# Patient Record
Sex: Female | Born: 1945
Health system: Southern US, Community
[De-identification: ages and names within clinical notes are randomized; demographics above are authoritative.]

## PROBLEM LIST (undated history)

## (undated) DIAGNOSIS — E785 Hyperlipidemia, unspecified: Secondary | ICD-10-CM

## (undated) DIAGNOSIS — F32A Depression, unspecified: Secondary | ICD-10-CM

## (undated) DIAGNOSIS — J45909 Unspecified asthma, uncomplicated: Secondary | ICD-10-CM

## (undated) DIAGNOSIS — F329 Major depressive disorder, single episode, unspecified: Secondary | ICD-10-CM

## (undated) DIAGNOSIS — R51 Headache: Secondary | ICD-10-CM

## (undated) DIAGNOSIS — I639 Cerebral infarction, unspecified: Secondary | ICD-10-CM

## (undated) DIAGNOSIS — R519 Headache, unspecified: Secondary | ICD-10-CM

## (undated) DIAGNOSIS — F419 Anxiety disorder, unspecified: Secondary | ICD-10-CM

## (undated) DIAGNOSIS — I10 Essential (primary) hypertension: Secondary | ICD-10-CM

## (undated) DIAGNOSIS — J449 Chronic obstructive pulmonary disease, unspecified: Secondary | ICD-10-CM

## (undated) DIAGNOSIS — M797 Fibromyalgia: Secondary | ICD-10-CM

## (undated) DIAGNOSIS — G894 Chronic pain syndrome: Secondary | ICD-10-CM

## (undated) DIAGNOSIS — E559 Vitamin D deficiency, unspecified: Secondary | ICD-10-CM

## (undated) DIAGNOSIS — T7840XA Allergy, unspecified, initial encounter: Secondary | ICD-10-CM

## (undated) DIAGNOSIS — K589 Irritable bowel syndrome without diarrhea: Secondary | ICD-10-CM

## (undated) HISTORY — DX: Fibromyalgia: M79.7

## (undated) HISTORY — PX: CHOLECYSTECTOMY: SHX55

## (undated) HISTORY — PX: SINUSOTOMY: SHX291

## (undated) HISTORY — DX: Major depressive disorder, single episode, unspecified: F32.9

## (undated) HISTORY — DX: Essential (primary) hypertension: I10

## (undated) HISTORY — PX: CERVICAL DISCECTOMY: SHX98

## (undated) HISTORY — DX: Allergy, unspecified, initial encounter: T78.40XA

## (undated) HISTORY — PX: ABDOMINAL HYSTERECTOMY: SHX81

## (undated) HISTORY — DX: Headache, unspecified: R51.9

## (undated) HISTORY — DX: Chronic obstructive pulmonary disease, unspecified: J44.9

## (undated) HISTORY — DX: Headache: R51

## (undated) HISTORY — DX: Anxiety disorder, unspecified: F41.9

## (undated) HISTORY — DX: Cerebral infarction, unspecified: I63.9

## (undated) HISTORY — PX: APPENDECTOMY: SHX54

## (undated) HISTORY — DX: Irritable bowel syndrome, unspecified: K58.9

## (undated) HISTORY — DX: Hyperlipidemia, unspecified: E78.5

## (undated) HISTORY — DX: Vitamin D deficiency, unspecified: E55.9

## (undated) HISTORY — DX: Depression, unspecified: F32.A

---

## 2001-07-16 ENCOUNTER — Ambulatory Visit (HOSPITAL_COMMUNITY): Admission: RE | Admit: 2001-07-16 | Discharge: 2001-07-16 | Payer: Self-pay | Admitting: Neurosurgery

## 2001-07-16 ENCOUNTER — Encounter: Payer: Self-pay | Admitting: Neurosurgery

## 2002-09-19 ENCOUNTER — Encounter: Payer: Self-pay | Admitting: Neurosurgery

## 2002-09-19 ENCOUNTER — Ambulatory Visit (HOSPITAL_COMMUNITY): Admission: RE | Admit: 2002-09-19 | Discharge: 2002-09-19 | Payer: Self-pay | Admitting: Neurosurgery

## 2002-12-28 ENCOUNTER — Inpatient Hospital Stay (HOSPITAL_COMMUNITY): Admission: RE | Admit: 2002-12-28 | Discharge: 2002-12-29 | Payer: Self-pay | Admitting: Neurosurgery

## 2003-06-20 ENCOUNTER — Other Ambulatory Visit: Payer: Self-pay

## 2003-10-31 ENCOUNTER — Ambulatory Visit: Payer: Self-pay | Admitting: Pain Medicine

## 2003-12-08 ENCOUNTER — Ambulatory Visit: Payer: Self-pay | Admitting: Pain Medicine

## 2003-12-14 ENCOUNTER — Ambulatory Visit: Payer: Self-pay | Admitting: Pain Medicine

## 2004-01-05 ENCOUNTER — Ambulatory Visit: Payer: Self-pay | Admitting: Pain Medicine

## 2004-02-06 ENCOUNTER — Ambulatory Visit: Payer: Self-pay | Admitting: Pain Medicine

## 2004-03-13 ENCOUNTER — Ambulatory Visit: Payer: Self-pay | Admitting: Pain Medicine

## 2004-03-19 ENCOUNTER — Ambulatory Visit: Payer: Self-pay | Admitting: Pain Medicine

## 2004-03-26 ENCOUNTER — Ambulatory Visit: Payer: Self-pay | Admitting: Family Medicine

## 2004-04-03 ENCOUNTER — Ambulatory Visit: Payer: Self-pay | Admitting: Pain Medicine

## 2004-04-11 ENCOUNTER — Ambulatory Visit: Payer: Self-pay | Admitting: Pain Medicine

## 2004-05-01 ENCOUNTER — Ambulatory Visit: Payer: Self-pay | Admitting: Pain Medicine

## 2004-05-09 ENCOUNTER — Ambulatory Visit: Payer: Self-pay | Admitting: Pain Medicine

## 2004-05-29 ENCOUNTER — Ambulatory Visit: Payer: Self-pay | Admitting: Pain Medicine

## 2004-07-05 ENCOUNTER — Ambulatory Visit: Payer: Self-pay | Admitting: Pain Medicine

## 2004-08-07 ENCOUNTER — Ambulatory Visit: Payer: Self-pay | Admitting: Pain Medicine

## 2004-08-15 ENCOUNTER — Ambulatory Visit: Payer: Self-pay | Admitting: Pain Medicine

## 2004-09-04 ENCOUNTER — Ambulatory Visit: Payer: Self-pay | Admitting: Pain Medicine

## 2004-09-19 ENCOUNTER — Ambulatory Visit: Payer: Self-pay | Admitting: Pain Medicine

## 2004-11-01 ENCOUNTER — Ambulatory Visit: Payer: Self-pay | Admitting: Pain Medicine

## 2004-11-14 ENCOUNTER — Ambulatory Visit: Payer: Self-pay | Admitting: Pain Medicine

## 2004-11-20 ENCOUNTER — Emergency Department: Payer: Self-pay | Admitting: Emergency Medicine

## 2004-11-22 ENCOUNTER — Other Ambulatory Visit: Payer: Self-pay

## 2004-11-22 ENCOUNTER — Ambulatory Visit: Payer: Self-pay | Admitting: Specialist

## 2004-11-27 ENCOUNTER — Ambulatory Visit: Payer: Self-pay | Admitting: Specialist

## 2004-12-04 ENCOUNTER — Ambulatory Visit: Payer: Self-pay | Admitting: Pain Medicine

## 2004-12-12 ENCOUNTER — Ambulatory Visit: Payer: Self-pay | Admitting: Pain Medicine

## 2005-01-15 ENCOUNTER — Ambulatory Visit: Payer: Self-pay | Admitting: Pain Medicine

## 2005-02-12 ENCOUNTER — Ambulatory Visit: Payer: Self-pay | Admitting: Pain Medicine

## 2005-02-27 ENCOUNTER — Ambulatory Visit: Payer: Self-pay | Admitting: Pain Medicine

## 2005-03-12 ENCOUNTER — Ambulatory Visit: Payer: Self-pay | Admitting: Pain Medicine

## 2005-04-11 ENCOUNTER — Ambulatory Visit: Payer: Self-pay | Admitting: Pain Medicine

## 2005-05-01 ENCOUNTER — Ambulatory Visit: Payer: Self-pay | Admitting: Pain Medicine

## 2005-05-14 ENCOUNTER — Ambulatory Visit: Payer: Self-pay | Admitting: Pain Medicine

## 2005-06-12 ENCOUNTER — Ambulatory Visit: Payer: Self-pay | Admitting: Pain Medicine

## 2005-07-09 ENCOUNTER — Ambulatory Visit: Payer: Self-pay | Admitting: Pain Medicine

## 2005-07-24 ENCOUNTER — Ambulatory Visit: Payer: Self-pay | Admitting: Pain Medicine

## 2005-08-06 ENCOUNTER — Ambulatory Visit: Payer: Self-pay | Admitting: Pain Medicine

## 2005-09-03 ENCOUNTER — Inpatient Hospital Stay: Payer: Self-pay | Admitting: Internal Medicine

## 2005-09-03 ENCOUNTER — Ambulatory Visit: Payer: Self-pay | Admitting: Pain Medicine

## 2005-09-03 ENCOUNTER — Other Ambulatory Visit: Payer: Self-pay

## 2005-09-11 ENCOUNTER — Ambulatory Visit: Payer: Self-pay | Admitting: Pain Medicine

## 2005-09-22 ENCOUNTER — Inpatient Hospital Stay: Payer: Self-pay | Admitting: Unknown Physician Specialty

## 2006-02-11 ENCOUNTER — Ambulatory Visit: Payer: Self-pay | Admitting: Family Medicine

## 2006-03-12 ENCOUNTER — Ambulatory Visit: Payer: Self-pay | Admitting: Gastroenterology

## 2006-10-09 ENCOUNTER — Ambulatory Visit: Payer: Self-pay

## 2006-10-10 ENCOUNTER — Ambulatory Visit: Payer: Self-pay

## 2007-08-31 ENCOUNTER — Ambulatory Visit: Payer: Self-pay | Admitting: Family Medicine

## 2007-10-15 ENCOUNTER — Ambulatory Visit: Payer: Self-pay | Admitting: Family Medicine

## 2008-01-14 ENCOUNTER — Ambulatory Visit: Payer: Self-pay | Admitting: Gastroenterology

## 2008-01-15 ENCOUNTER — Ambulatory Visit: Payer: Self-pay | Admitting: Gastroenterology

## 2008-02-10 ENCOUNTER — Ambulatory Visit: Payer: Self-pay | Admitting: Gastroenterology

## 2008-05-09 ENCOUNTER — Ambulatory Visit: Payer: Self-pay | Admitting: Family Medicine

## 2009-01-28 HISTORY — PX: BREAST EXCISIONAL BIOPSY: SUR124

## 2009-01-28 HISTORY — PX: BREAST SURGERY: SHX581

## 2010-03-21 ENCOUNTER — Other Ambulatory Visit: Payer: Self-pay | Admitting: Anesthesiology

## 2010-04-11 ENCOUNTER — Ambulatory Visit: Payer: Self-pay | Admitting: Family Medicine

## 2010-10-24 ENCOUNTER — Inpatient Hospital Stay: Payer: Self-pay | Admitting: Psychiatry

## 2011-06-11 ENCOUNTER — Ambulatory Visit: Payer: Self-pay | Admitting: Family Medicine

## 2012-05-06 ENCOUNTER — Ambulatory Visit: Payer: Self-pay | Admitting: Family Medicine

## 2012-05-06 LAB — HM MAMMOGRAPHY: HM Mammogram: NORMAL

## 2012-05-06 LAB — HM DEXA SCAN: HM Dexa Scan: ABNORMAL

## 2012-08-29 ENCOUNTER — Ambulatory Visit: Payer: Self-pay | Admitting: Family Medicine

## 2012-09-25 ENCOUNTER — Emergency Department: Payer: Self-pay | Admitting: Emergency Medicine

## 2012-09-25 DIAGNOSIS — Z8701 Personal history of pneumonia (recurrent): Secondary | ICD-10-CM | POA: Insufficient documentation

## 2012-09-25 LAB — BASIC METABOLIC PANEL
Anion Gap: 8 (ref 7–16)
BUN: 12 mg/dL (ref 7–18)
Calcium, Total: 8.7 mg/dL (ref 8.5–10.1)
Chloride: 105 mmol/L (ref 98–107)
Co2: 24 mmol/L (ref 21–32)
Creatinine: 0.93 mg/dL (ref 0.60–1.30)
EGFR (African American): >60
EGFR (Non-African Amer.): >60
Glucose: 204 mg/dL — ABNORMAL HIGH (ref 65–99)
Osmolality: 279 (ref 275–301)
Potassium: 3.2 mmol/L — ABNORMAL LOW (ref 3.5–5.1)
Sodium: 137 mmol/L (ref 136–145)

## 2012-09-25 LAB — CBC
HCT: 35.7 % (ref 35.0–47.0)
HGB: 12 g/dL (ref 12.0–16.0)
MCH: 30.3 pg (ref 26.0–34.0)
MCHC: 33.6 g/dL (ref 32.0–36.0)
MCV: 90 fL (ref 80–100)
Platelet: 295 10*3/uL (ref 150–440)
RBC: 3.97 10*6/uL (ref 3.80–5.20)
RDW: 13.4 % (ref 11.5–14.5)
WBC: 14.6 10*3/uL — ABNORMAL HIGH (ref 3.6–11.0)

## 2012-09-25 LAB — TROPONIN I: Troponin-I: <0.02 ng/mL

## 2012-12-20 ENCOUNTER — Emergency Department: Payer: Self-pay | Admitting: Emergency Medicine

## 2012-12-20 LAB — URINALYSIS, COMPLETE
Bacteria: NONE SEEN
Bilirubin,UR: NEGATIVE
Blood: NEGATIVE
Glucose,UR: NEGATIVE mg/dL (ref 0–75)
Ketone: NEGATIVE
Leukocyte Esterase: NEGATIVE
Nitrite: NEGATIVE
Ph: 6 (ref 4.5–8.0)
Protein: NEGATIVE
RBC,UR: NONE SEEN /HPF (ref 0–5)
Specific Gravity: 1.008 (ref 1.003–1.030)
Squamous Epithelial: <1
WBC UR: 1 /HPF (ref 0–5)

## 2012-12-20 LAB — COMPREHENSIVE METABOLIC PANEL
Albumin: 3.5 g/dL (ref 3.4–5.0)
Alkaline Phosphatase: 93 U/L
Anion Gap: 3 — ABNORMAL LOW (ref 7–16)
BUN: 22 mg/dL — ABNORMAL HIGH (ref 7–18)
Bilirubin,Total: 0.3 mg/dL (ref 0.2–1.0)
Calcium, Total: 8.8 mg/dL (ref 8.5–10.1)
Chloride: 109 mmol/L — ABNORMAL HIGH (ref 98–107)
Co2: 27 mmol/L (ref 21–32)
Creatinine: 0.96 mg/dL (ref 0.60–1.30)
EGFR (African American): >60
EGFR (Non-African Amer.): >60
Glucose: 64 mg/dL — ABNORMAL LOW (ref 65–99)
Osmolality: 279 (ref 275–301)
Potassium: 3.4 mmol/L — ABNORMAL LOW (ref 3.5–5.1)
SGOT(AST): 23 U/L (ref 15–37)
SGPT (ALT): 14 U/L (ref 12–78)
Sodium: 139 mmol/L (ref 136–145)
Total Protein: 7.8 g/dL (ref 6.4–8.2)

## 2012-12-20 LAB — CBC
HCT: 38.7 % (ref 35.0–47.0)
HGB: 12.8 g/dL (ref 12.0–16.0)
MCH: 29.3 pg (ref 26.0–34.0)
MCHC: 32.9 g/dL (ref 32.0–36.0)
MCV: 89 fL (ref 80–100)
Platelet: 166 10*3/uL (ref 150–440)
RBC: 4.36 10*6/uL (ref 3.80–5.20)
RDW: 15.6 % — ABNORMAL HIGH (ref 11.5–14.5)
WBC: 6.7 10*3/uL (ref 3.6–11.0)

## 2012-12-20 LAB — TROPONIN I: Troponin-I: <0.02 ng/mL

## 2012-12-25 ENCOUNTER — Inpatient Hospital Stay: Payer: Self-pay | Admitting: Internal Medicine

## 2012-12-25 LAB — CBC WITH DIFFERENTIAL/PLATELET
Basophil #: 0.1 10*3/uL (ref 0.0–0.1)
Basophil %: 0.9 %
Eosinophil #: 0.1 10*3/uL (ref 0.0–0.7)
Eosinophil %: 1.9 %
HCT: 36.1 % (ref 35.0–47.0)
HGB: 12.2 g/dL (ref 12.0–16.0)
Lymphocyte #: 2.6 10*3/uL (ref 1.0–3.6)
Lymphocyte %: 42.1 %
MCH: 29.8 pg (ref 26.0–34.0)
MCHC: 33.7 g/dL (ref 32.0–36.0)
MCV: 88 fL (ref 80–100)
Monocyte #: 0.4 x10 3/mm (ref 0.2–0.9)
Monocyte %: 6.9 %
Neutrophil #: 2.9 10*3/uL (ref 1.4–6.5)
Neutrophil %: 48.2 %
Platelet: 177 10*3/uL (ref 150–440)
RBC: 4.09 10*6/uL (ref 3.80–5.20)
RDW: 15.6 % — ABNORMAL HIGH (ref 11.5–14.5)
WBC: 6.1 10*3/uL (ref 3.6–11.0)

## 2012-12-25 LAB — URINALYSIS, COMPLETE
Bilirubin,UR: NEGATIVE
Glucose,UR: NEGATIVE mg/dL (ref 0–75)
Ketone: NEGATIVE
Nitrite: NEGATIVE
Ph: 6 (ref 4.5–8.0)
Protein: NEGATIVE
RBC,UR: 33 /HPF (ref 0–5)
Specific Gravity: 1.015 (ref 1.003–1.030)
Squamous Epithelial: 11
WBC UR: 15 /HPF (ref 0–5)

## 2012-12-25 LAB — LIPID PANEL
Cholesterol: 185 mg/dL (ref 0–200)
HDL Cholesterol: 39 mg/dL — ABNORMAL LOW (ref 40–60)
Ldl Cholesterol, Calc: 127 mg/dL — ABNORMAL HIGH (ref 0–100)
Triglycerides: 93 mg/dL (ref 0–200)
VLDL Cholesterol, Calc: 19 mg/dL (ref 5–40)

## 2012-12-25 LAB — BASIC METABOLIC PANEL
Anion Gap: 7 (ref 7–16)
BUN: 14 mg/dL (ref 7–18)
Calcium, Total: 8.9 mg/dL (ref 8.5–10.1)
Chloride: 109 mmol/L — ABNORMAL HIGH (ref 98–107)
Co2: 24 mmol/L (ref 21–32)
Creatinine: 0.9 mg/dL (ref 0.60–1.30)
EGFR (African American): >60
EGFR (Non-African Amer.): >60
Glucose: 113 mg/dL — ABNORMAL HIGH (ref 65–99)
Osmolality: 281 (ref 275–301)
Potassium: 3.4 mmol/L — ABNORMAL LOW (ref 3.5–5.1)
Sodium: 140 mmol/L (ref 136–145)

## 2012-12-25 LAB — MAGNESIUM: Magnesium: 1.8 mg/dL

## 2012-12-25 LAB — TROPONIN I: Troponin-I: <0.02 ng/mL

## 2012-12-26 ENCOUNTER — Ambulatory Visit: Payer: Self-pay | Admitting: Neurology

## 2012-12-26 DIAGNOSIS — I359 Nonrheumatic aortic valve disorder, unspecified: Secondary | ICD-10-CM

## 2012-12-26 LAB — BASIC METABOLIC PANEL
Anion Gap: 6 — ABNORMAL LOW (ref 7–16)
BUN: 13 mg/dL (ref 7–18)
Calcium, Total: 9.1 mg/dL (ref 8.5–10.1)
Chloride: 114 mmol/L — ABNORMAL HIGH (ref 98–107)
Co2: 24 mmol/L (ref 21–32)
Creatinine: 0.91 mg/dL (ref 0.60–1.30)
EGFR (African American): >60
EGFR (Non-African Amer.): >60
Glucose: 85 mg/dL (ref 65–99)
Osmolality: 286 (ref 275–301)
Potassium: 3.3 mmol/L — ABNORMAL LOW (ref 3.5–5.1)
Sodium: 144 mmol/L (ref 136–145)

## 2012-12-29 LAB — PLATELET COUNT: Platelet: 166 10*3/uL (ref 150–440)

## 2013-05-26 LAB — HM COLONOSCOPY: HM Colonoscopy: NORMAL

## 2013-06-03 DIAGNOSIS — D235 Other benign neoplasm of skin of trunk: Secondary | ICD-10-CM | POA: Insufficient documentation

## 2013-07-19 DIAGNOSIS — I10 Essential (primary) hypertension: Secondary | ICD-10-CM | POA: Insufficient documentation

## 2014-04-08 LAB — LIPID PANEL
Cholesterol: 185 mg/dL (ref 0–200)
HDL: 53 mg/dL (ref 35–70)
LDL Cholesterol: 114 mg/dL
Triglycerides: 91 mg/dL (ref 40–160)

## 2014-04-08 LAB — HEMOGLOBIN A1C: Hgb A1c MFr Bld: 5.9 % (ref 4.0–6.0)

## 2014-05-20 NOTE — Discharge Summary (Signed)
PATIENT NAME:  Beth [name], Beth [name] MR#:  [id number] DATE OF BIRTH:  Mar 29, 1945  DATE OF ADMISSION:  12/25/2012 DATE OF DISCHARGE:  12/30/2012  DISCHARGE DIAGNOSIS:  Acute right lacunar/cordate nucleus cerebral vascular accident with left upper and lower extremity weakness with recurrent fall, also had left facial droop and the patient was strongly recommended to go a skilled nursing facility rehab. The patient refused and just wanted to go home. She was acceptable to get home health services.   SECONDARY DIAGNOSES: 1. Gastroesophageal reflux disease.  2.  Depression.  3.  Cervical fusion surgery.  4.  Fibromyalgia.  5.  Chronic obstructive pulmonary disease,  6.  Bipolar disorder.   CONSULTATIONS: 1.  Neurology, Dr. Irish Elders.  2.  Physical and occupational therapy.   PROCEDURES/RADIOLOGY: A 2-D echocardiogram on the November 29,  showed no source of CVA, LVEF of 55% to 60%. Normal global LV systolic function. Normal RV size and systolic function. Mild mitral valve and aortic valve regurgitation. Mildly elevated pulmonary artery systolic pressure.   Bilateral carotid Doppler on November 28,  showed less than 50% stenosis in the right and left internal carotid arteries.   CT scan of cervical spine without contrast on November 28,  showed prior anterior cervical spine fusion of C5-C6 and C6-C7 with good anatomic alignment. No acute abnormality. Stable nonhealing fracture of the pedicles of C7.   CT scan of the head without contrast on the November 28, showed new lacunar infarct involving the right basal ganglia and right caudate body since 12/20/2012.   Chest x-ray on the November 23, showed chronic obstructive pulmonary disease, no acute cardiopulmonary disease.   CT scan of the maxillofacial area without contrast on the November 23, showed no acute abnormality. Mild diffuse cortical atrophy. No acute intracranial abnormality.   CT scan of cervical spine without contrast on the 23rd of  November showed no acute abnormality. Status post surgical anterior fusion of C5-6-7.  Incompletely healed fracture involving the pedicle of C7. No acute fracture.   CT scan of the head without contrast on the November 23,  showed no acute abnormality.   MAJOR LABORATORY PANEL: Urinalysis on admission showed trace bacteria, 15 WBCs, 3+ leukocyte esterase.   HISTORY AND SHORT HOSPITAL COURSE: The patient is a 69 year old female with the above-mentioned medical problems, who was admitted for recurrent fall at home with weakness of the left upper and lower extremity. Please see Dr. Gus Height Patel's dictated history and physical for further details. Neurology consultation was obtained with Dr. Irish Elders, who recommended repeating CT scan which showed acute stroke of the right basal ganglia and caudate nucleus. The patient was started on aspirin and statin. Aggressive physical and occupational therapy management was started in the hospital. The patient was strongly recommended to go to rehab by both therapies, professional recommendation including physician. The patient continued to refuse and decided to just go home. After a long discussion with the patient, she was agreeable to at least take home health services at home and she was discharged home on December 3, in stable condition. On the date of discharge, her vital signs are as follows: Temperature 98.6, heart rate 99 per minute, respirations 18 per minute, blood pressure 105/72. She was saturating 93%.   PERTINENT PHYSICAL EXAMINATION ON THE DATE OF DISCHARGE:  CARDIOVASCULAR: S1, S2 normal. No murmurs, rubs gallop.  LUNGS: Clear to auscultation bilaterally. No wheezing, rales, rhonchi, or crepitation.  ABDOMEN: Soft, benign.  NEUROLOGIC: Nonfocal examination.  All other physical examination remained at  baseline.   DISCHARGE MEDICATIONS: 1.  Singulair 10 mg p.o. daily.  2.  Nexium 40 mg p.o. daily.  3.  Mirtazapine 30 mg p.o. at bedtime.  4.   Meloxicam 7.5 mg p.o. b.i.d.  5.  Duloxetine 60 mg p.o. daily.  6.  Clonazepam 1 mg p.o. 3 times a day as needed.  7.  Multivitamin once daily.  8.  Quetiapine 50 mg 1 to 1/2 tablet p.o. at bedtime.  9.  Cyclobenzaprine 10 mg p.o. b.i.d. as needed.  10.  Promethazine 25 mg p.o. half to 1 tablet p.o. every six hours as needed.  11.  Voltaren topical 1% topical gel to affected area twice a day.  12.  Morphine 15 mg p.o. every eight hours as needed.  13.  Topiramate 100 mg p.o. b.i.d.  14.  Propranolol 10 mg p.o. b.i.d. as needed.  15.  Vitamin D3 1000 international units once daily.  16.  Combivent 1 puff inhaled 4 times a day as needed.  17.  Advair 250/50 one puff inhaled twice a day.  16.  Spiriva once daily.  17.  Aspirin 325 mg p.o. daily.  18.  Lovastatin 10 mg p.o. at bedtime.   DISCHARGE DIET: Low sodium, low fat, low cholesterol.   DISCHARGE ACTIVITY: As tolerated.   DISCHARGE INSTRUCTIONS AND FOLLOW-UP: The patient was instructed to follow up with her primary care physician, Dr. Steele Sizer in 1 to 2 weeks. She will need follow-up with [facility] neurology in 2 to 4 weeks.   TOTAL TIME DISCHARGING THIS PATIENT: 55 minutes.    ____________________________ Lucina Mellow. Manuella Ghazi, MD vss:cc D: 12/31/2012 17:07:14 ET T: 12/31/2012 20:31:58 ET JOB#: [id number]  cc: Alazar Cherian S. Manuella Ghazi, MD, <Dictator> Bethena Roys. Ancil Boozer, MD Leotis Pain, MD [facility] Neurology  Lucina Mellow [facility] MD ELECTRONICALLY SIGNED 01/01/2013 17:07

## 2014-05-20 NOTE — H&P (Signed)
PATIENT NAME:  Beth [name], PASLEY MR#:  [id number] DATE OF BIRTH:  1945/03/12  DATE OF ADMISSION:  12/25/2012  PRIMARY CARE PHYSICIAN:  Dr. Ancil Boozer.   PRESENTING COMPLAINT:  Falls at home and weakness, more in the left lower extremity than upper extremity today.   HISTORY OF PRESENT ILLNESS: Beth [name] is a 69 year old Caucasian female with history of depression, chronic back pain on narcotics, fibromyalgia, GERD and history of COPD, comes to the Emergency Room after she has had multiple falls today. She started noticing more weakness in her left lower extremity and came to the Emergency Room. CT of the head was done which shows new lacunar infarct in the right basal ganglia and right caudate nucleus. She is being admitted for further evaluation and management. The patient does not have a history of CVA in the past. She received an aspirin in the Emergency Room. The patient does not take any aspirin at home.   The patient was seen here in the Emergency Room on November 14 after she had a mechanical fall while carrying a laundry basket, fell forward and hit a dresser where she had hit her face and lost 4 teeth and has some bruises over her face. CT of the head at that time was essentially negative. CT of the maxillofacial was negative and CT of the head showed mild diffuse cortical atrophy on 12/20/2012. She was sent home at that time. The patient is thereby now being admitted for workup on CVA.   PAST MEDICAL HISTORY: 1.  GERD.  2.  Depression.  3.  Cervical fusion surgery in the past.  4.  Fibromyalgia.  5.  COPD.  6.  Left elbow surgery.  7.  Appendectomy.  8.  Bladder surgery.  9.  Total hysterectomy.  10.  Overdose on Xanax in 1987.  11.  Bipolar disorder.   ALLERGIES: BEXTRA, COMPAZINE AND LITHIUM.   MEDICATIONS AT HOME: 1.  Voltaren topical, apply to affected area b.i.d.  2.  Vitamin D3, 1000 International Units daily.  3.  Topiramate 100 mg b.i.d.  4.  Singulair 10 mg daily.  5.   Seroquel 50 mg at bedtime.  6.  Propranolol 10 mg b.i.d.  7.  Promethazine 25 mg 1/2 tablet to 1 every 6 hourly as needed.  8.  Nexium 40 mg daily.  9.  Multivitamin p.o. daily.  10.  Morphine 15 mg 1 tablet every 8 hours.  11.  Remeron 30 mg at bedtime.  12.  Meloxicam 7.5 mg b.i.d.  13.  Duloxetine 60 mg p.o. daily.  14.  Cyclobenzaprine 10 mg 1 tablet b.i.d.  15.  Clonazepam 1 mg 3 times a day.   FAMILY HISTORY:  Father had metastatic prostate cancer.   SOCIAL HISTORY: Lives at home with her husband. Ex-smoker. Denies alcohol use.   REVIEW OF SYSTEMS:  CONSTITUTIONAL: Positive for weakness and fatigue.  EYES: No blurred or double vision, glaucoma or cataracts.  EARS, NOSE, THROAT: No tinnitus, discharge, snoring or postnasal drip.  RESPIRATORY: No cough, wheeze, hemoptysis or COPD.  CARDIOVASCULAR: No chest pain. No orthopnea, edema or hypertension.  GASTROINTESTINAL: No nausea, vomiting, diarrhea, abdominal pain. Positive for GERD.  GENITOURINARY: No dysuria, hematuria, renal calculus or frequency.  ENDOCRINE: No polyuria or nocturia or thyroid problems.  HEMATOLOGY: No anemia or easy bruising or bleeding.   SKIN: No acne or rash. No lesions. MUSCULOSKELETAL:   Positive for chronic back pain and arthritis.  No swelling or gout.   NEUROLOGIC:  Positive  for weakness in the lower extremity.  No dysarthria. Positive for ataxia and falls. PSYCHIATRIC: Positive for bipolar disorder. No anxiety or depression. All other systems reviewed are negative.   PHYSICAL EXAMINATION: GENERAL: The patient is awake, alert. She is oriented x 3. Afebrile. Pulse is 73. Blood pressure is 114/56. Respirations 18 per minute, pulse ox 99% on room air.  HEENT: The patient has some facial bruises from previous fall, 12/20/2012. No active bleeding. Head is atraumatic.  Pupils: PERRLA.  EOM intact. Oral mucosa is moist. The patient has lost 4 front teeth on the left due to fall.   NECK: Supple. No JVD. No  carotid bruit.  LUNGS: Clear to auscultation bilaterally. No rales, rhonchi, respiratory distress or labored breathing.  HEART: Both the heart sounds are normal. Rate, rhythm regular. PMI not lateralized. Chest nontender.   EXTREMITIES: Good pedal pulses, good femoral pulses. No lower extremity edema.  ABDOMEN: Soft, benign, nontender. No organomegaly. Positive bowel sounds.  NEUROLOGIC: The patient does have left facial droop along with left upper extremity motor power 4+/5.  Left lower extremity is 3+/5.  Plantars are downgoing.  Reflexes: Deep tendon jerks are 1+ in both upper and lower extremities. Sensory exam within normal limits. Gait deferred secondary to ataxia and falls.  SKIN: Warm and dry.  PSYCHIATRIC:  The patient has a flat affect. She appears depressed. No anxiety or mood disorder.  SKIN: Warm and dry with some old bruises and ecchymosis present over the face and elbows.   CT cervical spine shows anterior cervical spine fusion of C5-C6, C6-C7 with good anatomic alignment, stable. Non-healed fractures of the pedicle of C7.   CT of the head shows new lacunar infarct involving the right basal ganglia and right caudate nucleus since 12/20/2012.   UA positive for UTI.   CBC within normal limits.   Basic metabolic panel within normal limits.   Troponin is 0.02.   EKG shows normal sinus rhythm.   ASSESSMENT AND PLAN:  A 69 year old Beth [name] who comes in with:  1.  Multiple falls at home was found to have on exam acute right lacunar/caudate nucleus cerebral vascular accident.  The patient presented with left upper and lower extremity weakness with falls, found to have left facial droop.   Admit the patient  to Telemetry. -Keep her n.p.o., continue IV fluids, speech therapy, physical therapy and occupational therapy. We will get neurologic consultation.   Check ultrasound carotid Doppler and Echo of the heart and MRI brain  2.  Chronic obstructive pulmonary disease.  Continue  inhalers.  The patient's sats appear to be stable. 3.  Chronic back pain with chronic narcotic dependence.  We will continue her pain meds as needed 4.  Gastroesophageal reflux disease.  Continue Nexium. 5.  Bipolar disorder. The patient is on topiramate and Seroquel, which will be continued. 6.  Depression. Continue duloxetine.   7.  Fibromyalgia. The patient will be continued on p.r.n. cyclobenzaprine and meloxicam and p.r.n. morphine.  8.  Deep venous thrombosis prophylaxis with subQ heparin.   Care management for discharge planning.   Further workup according to the patient's clinical course.  The patient is a FULL CODE.  TIME SPENT:  55 minutes.  ____________________________ Hart Rochester Posey Pronto, MD sap:dmm D: 12/25/2012 17:13:07 ET T: 12/25/2012 19:07:11 ET JOB#: [id number]  cc: Hamzeh Tall A. Posey Pronto, MD, <Dictator> Ilda Basset MD ELECTRONICALLY SIGNED 01/08/2013 11:08

## 2014-05-20 NOTE — Consult Note (Signed)
PATIENT NAME:  Beth [name], Beth [name] MR#:  [id number] DATE OF BIRTH:  08/01/1945  DATE OF CONSULTATION:  12/26/2012  REFERRING PHYSICIAN:   CONSULTING PHYSICIAN:  Leotis Pain, MD  REASON FOR CONSULTATION:  Left lower extremity weakness.  HISTORY OF PRESENT ILLNESS:  This is a 69 year old Caucasian female with past medical history of depression, chronic back pain on narcotics, a history of fibromyalgia, GERD, a history of COPD, presenting to the hospital status post multiple fall. Upon evaluation, the patient was found to have a left-sided weakness. A CT of the head that done shows infarcts on the right basal ganglia, right caudate. The patient states that she has been having weakness in the left upper and left lower extremity, the left lower extremity more than left upper extremity, for the past month, more so in the past week and she is status post fall last Saturday, status post workup in the Emergency Department on 11/14 after another mechanical fall while carrying a laundry basket. The patient's current major complaint is generalized pain and is not complaining of left-sided weakness unless asked about it, no complaints about sensory deficits and no speech abnormalities.   PAST MEDICAL HISTORY:  Includes GERD, depression, cervical fusion, fibromyalgia, COPD, left elbow surgery, total hysterectomy, a history of Xanax use, bipolar.  MEDICATION AT HOME include:  1.  Vitamin D3. 2.  Voltaren topical. 3.  Topamax.  4.  Singulair.  5.  Seroquel.  6.  Propanolol.  7.  Nexium.  8.  Multivitamin.  9.  Morphine.  10.  Remeron.   11.  Meloxicam. 12.  Duloxetine.  13.  Cyclobenzaprine. 14.  Clonazepam.  REVIEW OF SYSTEMS:  CONSTITUTIONAL:  Generalized fatigue.  EYES:  No blurred or double vision.  EAR, NOSE, THROAT:  No tinnitus. No discharge.  RESPIRATORY:  No cough. No wheezing. No hemoptysis.  GASTROINTESTINAL:  No nausea, no vomiting.  GENITOURINARY:  No dysuria. No hematuria.   ENDOCRINE:  No polyuria or nocturia.  HEMATOLOGICAL:  No anemia or easy bruising.  MUSCULOSKELETAL:  Chronic arthritis, generalized, diffuse body aches.  NEUROLOGICAL:  Left upper and left lower extremity weakness.  PSYCHIATRIC:  A history of bipolar disorder. There is history of depression.   IMAGING:  Carotid Dopplers showed less than 50% stenosis bilaterally. CT cervical spine showed chronic fusions at C5-C6 and C6-C7 and she has nonhealed fractures in the C7 pedicle. CT of the head showed lacunar infarcts in the right basal ganglia and right caudate that have not been seen on the imaging from a week ago.   LABORATORY DATA:  Workup includes glucose 85, BUN 13, creatinine is 0.91. Sodium is 144, potassium is 3.3. White blood cells 6.1, hemoglobin 12.2, hematocrit is 36.1, magnesium is 1.8.   PHYSICAL EXAMINATION: VITAL SIGNS:  On further evaluation and neurological evaluations, the patient's temperature is 98, pulse 82, respirations 18, blood pressure 153/81. NEUROLOGIC:   The patient is alert, awake, oriented to place, could not tell me the date, time or the President of the [place]. CRANIAL NERVE EXAMINATION:  Extraocular movements intact. Visual fields appear intact. Facial sensation intact. Facial motor is intact. Tongue is midline. Uvula elevates symmetrically. Shoulder shrug intact. On motor strength examination, there is left upper extremity drift, which is 4+/5, with the right upper extremity 5/5, left lower extremity is 3/5 proximally at hip flexion and the right lower extremity is 4+/5.  ASSESSMENT:   This is a 69 year old female with chronic depression, fibromyalgia, gastroesophageal reflux disease, chronic obstructive pulmonary disease,  depression, suspected bipolar disorder, presenting with which she states is a month history of left upper and left lower extremity, which is worse in the past week status post fall. Imaging showed right basal ganglia and right caudate infarcts  that are probably a week old in nature. The patient is started on aspirin and statin.   PLAN:  Physical therapy, occupational therapy. Continue aspirin and statin. Discharge planning probably to rehab facility. Telemetry monitoring. Blood pressure control is currently systolic, blood pressures to keep below 160.   Thank you. It was a pleasure seeing this patient. Please call with any questions.   ____________________________ Leotis Pain, MD yz:jm D: 12/26/2012 12:53:01 ET T: 12/26/2012 13:41:21 ET JOB#: [id number]  cc: Leotis Pain, MD, <Dictator> Leotis Pain MD ELECTRONICALLY SIGNED 12/27/2012 13:19

## 2014-07-29 ENCOUNTER — Telehealth: Payer: Self-pay | Admitting: Psychiatry

## 2014-07-29 MED ORDER — CLONAZEPAM 1 MG PO TABS: 1.0000 mg | ORAL_TABLET | Freq: Three times a day (TID) | ORAL | Status: DC

## 2014-07-29 NOTE — Telephone Encounter (Signed)
rx faxed

## 2014-07-29 NOTE — Telephone Encounter (Signed)
pt was called and told that a small amount was faxed into pharmacy. i told pt that she also needed to make a appt to come in. pt was made an appt for 08-18-14.
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
## 2014-08-02 ENCOUNTER — Other Ambulatory Visit: Payer: Self-pay

## 2014-08-02 NOTE — Telephone Encounter (Signed)
faxed requested for clonazepam 1 mg.  pt last seen 06-07-14 next appt 08-18-14 can you please refill since dr. Jimmye Norman is out of the office this week.

## 2014-08-08 MED ORDER — CLONAZEPAM 1 MG PO TABS: 1.0000 mg | ORAL_TABLET | Freq: Three times a day (TID) | ORAL | Status: DC

## 2014-08-08 NOTE — Telephone Encounter (Signed)
rx was faxed, notified pt.

## 2014-08-08 NOTE — Telephone Encounter (Signed)
rx was faxed and confirmed.

## 2014-08-09 ENCOUNTER — Encounter: Payer: Self-pay | Admitting: Family Medicine

## 2014-08-09 ENCOUNTER — Ambulatory Visit (INDEPENDENT_AMBULATORY_CARE_PROVIDER_SITE_OTHER): Payer: PPO | Admitting: Family Medicine

## 2014-08-09 VITALS — BP 130/62 | HR 78 | Temp 97.7°F | Resp 16 | Ht 64.0 in | Wt 146.3 lb

## 2014-08-09 DIAGNOSIS — I69359 Hemiplegia and hemiparesis following cerebral infarction affecting unspecified side: Secondary | ICD-10-CM | POA: Insufficient documentation

## 2014-08-09 DIAGNOSIS — Z8673 Personal history of transient ischemic attack (TIA), and cerebral infarction without residual deficits: Secondary | ICD-10-CM | POA: Insufficient documentation

## 2014-08-09 DIAGNOSIS — E8881 Metabolic syndrome: Secondary | ICD-10-CM | POA: Insufficient documentation

## 2014-08-09 DIAGNOSIS — F339 Major depressive disorder, recurrent, unspecified: Secondary | ICD-10-CM | POA: Insufficient documentation

## 2014-08-09 DIAGNOSIS — M545 Low back pain, unspecified: Secondary | ICD-10-CM | POA: Insufficient documentation

## 2014-08-09 DIAGNOSIS — M5440 Lumbago with sciatica, unspecified side: Secondary | ICD-10-CM

## 2014-08-09 DIAGNOSIS — K635 Polyp of colon: Secondary | ICD-10-CM | POA: Insufficient documentation

## 2014-08-09 DIAGNOSIS — J449 Chronic obstructive pulmonary disease, unspecified: Secondary | ICD-10-CM | POA: Insufficient documentation

## 2014-08-09 DIAGNOSIS — M810 Age-related osteoporosis without current pathological fracture: Secondary | ICD-10-CM | POA: Insufficient documentation

## 2014-08-09 DIAGNOSIS — H6980 Other specified disorders of Eustachian tube, unspecified ear: Secondary | ICD-10-CM | POA: Insufficient documentation

## 2014-08-09 DIAGNOSIS — K219 Gastro-esophageal reflux disease without esophagitis: Secondary | ICD-10-CM

## 2014-08-09 DIAGNOSIS — N760 Acute vaginitis: Secondary | ICD-10-CM | POA: Diagnosis not present

## 2014-08-09 DIAGNOSIS — J441 Chronic obstructive pulmonary disease with (acute) exacerbation: Secondary | ICD-10-CM | POA: Diagnosis not present

## 2014-08-09 DIAGNOSIS — K141 Geographic tongue: Secondary | ICD-10-CM | POA: Insufficient documentation

## 2014-08-09 DIAGNOSIS — K589 Irritable bowel syndrome without diarrhea: Secondary | ICD-10-CM | POA: Insufficient documentation

## 2014-08-09 DIAGNOSIS — M7918 Myalgia, other site: Secondary | ICD-10-CM | POA: Insufficient documentation

## 2014-08-09 DIAGNOSIS — G8929 Other chronic pain: Secondary | ICD-10-CM | POA: Insufficient documentation

## 2014-08-09 DIAGNOSIS — M797 Fibromyalgia: Secondary | ICD-10-CM | POA: Insufficient documentation

## 2014-08-09 DIAGNOSIS — E785 Hyperlipidemia, unspecified: Secondary | ICD-10-CM | POA: Insufficient documentation

## 2014-08-09 DIAGNOSIS — E559 Vitamin D deficiency, unspecified: Secondary | ICD-10-CM | POA: Insufficient documentation

## 2014-08-09 DIAGNOSIS — F17200 Nicotine dependence, unspecified, uncomplicated: Secondary | ICD-10-CM | POA: Insufficient documentation

## 2014-08-09 MED ORDER — FLUCONAZOLE 150 MG PO TABS: 150.0000 mg | ORAL_TABLET | ORAL | Status: DC

## 2014-08-09 MED ORDER — FLUTICASONE-SALMETEROL 250-50 MCG/DOSE IN AEPB: 1.0000 | INHALATION_SPRAY | Freq: Two times a day (BID) | RESPIRATORY_TRACT | Status: DC

## 2014-08-09 MED ORDER — TIOTROPIUM BROMIDE MONOHYDRATE 18 MCG IN CAPS: 18.0000 ug | ORAL_CAPSULE | Freq: Every day | RESPIRATORY_TRACT | Status: DC

## 2014-08-09 MED ORDER — OMEPRAZOLE 20 MG PO CPDR
20.0000 mg | DELAYED_RELEASE_CAPSULE | Freq: Every day | ORAL | Status: DC
Start: 2014-08-09 — End: 2015-01-27
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
## 2014-08-18 ENCOUNTER — Encounter: Payer: Self-pay | Admitting: Psychiatry

## 2014-08-18 ENCOUNTER — Ambulatory Visit (INDEPENDENT_AMBULATORY_CARE_PROVIDER_SITE_OTHER): Payer: PPO | Admitting: Psychiatry

## 2014-08-18 VITALS — BP 108/70 | HR 64 | Temp 98.2°F | Ht 65.0 in | Wt 144.2 lb

## 2014-08-18 DIAGNOSIS — F411 Generalized anxiety disorder: Secondary | ICD-10-CM | POA: Diagnosis not present

## 2014-08-18 MED ORDER — MIRTAZAPINE 15 MG PO TABS: 15.0000 mg | ORAL_TABLET | Freq: Every day | ORAL | Status: DC

## 2014-08-18 MED ORDER — DULOXETINE HCL 30 MG PO CPEP: 30.0000 mg | ORAL_CAPSULE | Freq: Two times a day (BID) | ORAL | Status: DC

## 2014-08-18 MED ORDER — CLONAZEPAM 1 MG PO TABS: 1.0000 mg | ORAL_TABLET | Freq: Three times a day (TID) | ORAL | Status: DC

## 2014-08-18 NOTE — Progress Notes (Signed)
[facility] MD/PA/NP OP Progress Note  08/18/2014 4:33 PM Beth [name]  MRN:  [id number]  Subjective:  She returns for follow-up of her generalized anxiety disorder and major depressive disorder. She states she's been off of all of her medications for about 3 weeks secondary to finances. I did review with her the issue about her report of her stolen clonazepam and informed her that in the future should there be missing or stolen medications that we would need a police report presented prior to her getting any more medication.  Feels her medications help her. She discusses that she feels frustrated by her physical health. I strongly encourage her to engage in therapy given these chronic stressors of finances and physical health. She indicated she would prefer to wait on pursuing this at this time and would call back to make an appointment should she decide to go forward with that.  She indicated that the mirtazapine did help her mood and sleep. Chief Complaint:  Chief Complaint    Follow-up; Anxiety; Depression; Nausea     Visit Diagnosis:  No diagnosis found.  Past Medical History:  Past Medical History  Diagnosis Date  . Vitamin D deficiency   . IBS (irritable bowel syndrome)   . COPD (chronic obstructive pulmonary disease)   . Hypertension   . Allergy   . CVA (cerebral infarction)   . Headache   . Fibromyalgia   . Hyperlipidemia   . Anxiety   . Depression   . Diabetes mellitus, type II   . Stroke     Past Surgical History  Procedure Laterality Date  . Breast surgery  2011    biopsy  . Cervical discectomy    . Appendectomy    . Abdominal hysterectomy    . Cholecystectomy    . Sinusotomy     Family History:  Family History  Problem Relation Age of Onset  . Anxiety disorder Mother   . Depression Mother   . Cancer Father   . Gallbladder disease Father   . Alcohol abuse Father   . Depression Father    Social History:  History   Social History  . Marital Status: Single     Spouse Name: N/A  . Number of Children: N/A  . Years of Education: N/A   Social History Main Topics  . Smoking status: Former Smoker -- 1.00 packs/day for 35 years    Types: Cigarettes    Quit date: 01/29/2012  . Smokeless tobacco: Never Used  . Alcohol Use: No  . Drug Use: No  . Sexual Activity: No   Other Topics Concern  . None   Social History Narrative   Additional History:   Assessment:   Musculoskeletal: Strength & Muscle Tone: within normal limits Gait & Station: Slow Patient leans: N/A  Psychiatric Specialty Exam: HPI  Review of Systems  Psychiatric/Behavioral: Positive for depression (he continues to have depression but does state the medications do help.). Negative for suicidal ideas, hallucinations, memory loss and substance abuse. The patient is nervous/anxious and has insomnia (Stated the mirtazapine did help her with insomnia.).     Blood pressure 108/70, pulse 64, temperature 98.2 F (36.8 C), temperature source Tympanic, height 5\' 5"  (1.651 m), weight 144 lb 3.2 oz (65.409 kg), SpO2 90 %.Body mass index is 24 kg/(m^2).  General Appearance: Well Groomed  Eye Contact:  Good  Speech:  slow  Volume:  Decreased  Mood:  Anxious  Affect:  Congruent  Thought Process:  Linear  Orientation:  Full (Time, Place, and Person)  Thought Content:  Negative  Suicidal Thoughts:  No  Homicidal Thoughts:  No  Memory:  Immediate;   Good Recent;   Good Remote;   Good  Judgement:  Good  Insight:  Good  Psychomotor Activity:  Negative  Concentration:  Good  Recall:  Good  Fund of Knowledge: Good  Language: Good  Akathisia:  Negative  Handed:  Right  AIMS (if indicated):  N/A  Assets:  Social Support  ADL's:  Intact  Cognition: WNL  Sleep: fair, improved with remeron   Is the patient at risk to self?  No. Has the patient been a risk to self in the past 6 months?  No. Has the patient been a risk to self within the distant past?  No. Is the patient a risk to  others?  No. Has the patient been a risk to others in the past 6 months?  No. Has the patient been a risk to others within the distant past?  No.  Current Medications: Current Outpatient Prescriptions  Medication Sig Dispense Refill  . albuterol (VENTOLIN HFA) 108 (90 BASE) MCG/ACT inhaler Inhale 2 puffs into the lungs every 4 (four) hours as needed.    Marland Kitchen aspirin 81 MG tablet Take 1 tablet by mouth daily.    Marland Kitchen atorvastatin (LIPITOR) 40 MG tablet Take 1 tablet by mouth daily.    . clonazePAM (KLONOPIN) 1 MG tablet Take 1 tablet (1 mg total) by mouth 3 (three) times daily. 90 tablet 1  . DULoxetine (CYMBALTA) 30 MG capsule Take 1 capsule (30 mg total) by mouth 2 (two) times daily. 60 capsule 2  . fluconazole (DIFLUCAN) 150 MG tablet Take 1 tablet (150 mg total) by mouth every other day. 3 tablet 0  . fluticasone (FLONASE) 50 MCG/ACT nasal spray Place 2 sprays into the nose daily.    . Fluticasone-Salmeterol (ADVAIR DISKUS) 250-50 MCG/DOSE AEPB Inhale 1 puff into the lungs 2 (two) times daily. 60 each 5  . metoprolol succinate (TOPROL-XL) 25 MG 24 hr tablet Take 25 mg by mouth daily.  3  . morphine (MSIR) 15 MG tablet Take 15 mg by mouth every 8 (eight) hours as needed. for pain  0  . omeprazole (PRILOSEC) 20 MG capsule Take 1 capsule (20 mg total) by mouth daily. 30 capsule 5  . tiotropium (SPIRIVA HANDIHALER) 18 MCG inhalation capsule Place 1 capsule (18 mcg total) into inhaler and inhale daily. 30 capsule 5  . mirtazapine (REMERON) 15 MG tablet Take 1 tablet (15 mg total) by mouth at bedtime. 30 tablet 2   No current facility-administered medications for this visit.    Medical Decision Making:  Established Problem, Stable/Improving (1)  Treatment Plan Summary:Medication management and Plan We will restart the patient's Cymbalta back to 30 mg twice daily as she states she has been off of it for 3 weeks. We will restart her Remeron at 15 mg at bedtime. We will continue her clonazepam 1  milligram 3 times daily. I strongly encourage patient to engage in therapy as it appears many of her stressors are chronic in nature. She will follow up in 2 months.   Faith Rogue 08/18/2014, 4:33 PM

## 2014-08-25 ENCOUNTER — Inpatient Hospital Stay
Admission: EM | Admit: 2014-08-25 | Discharge: 2014-08-26 | DRG: 190 | Disposition: A | Discharge status: Home / Self Care | Payer: PPO | Attending: Internal Medicine | Admitting: Internal Medicine

## 2014-08-25 ENCOUNTER — Emergency Department: Payer: PPO

## 2014-08-25 ENCOUNTER — Encounter: Payer: Self-pay | Admitting: Intensive Care

## 2014-08-25 DIAGNOSIS — Z87891 Personal history of nicotine dependence: Secondary | ICD-10-CM | POA: Diagnosis not present

## 2014-08-25 DIAGNOSIS — Z818 Family history of other mental and behavioral disorders: Secondary | ICD-10-CM

## 2014-08-25 DIAGNOSIS — Z79891 Long term (current) use of opiate analgesic: Secondary | ICD-10-CM | POA: Diagnosis not present

## 2014-08-25 DIAGNOSIS — J441 Chronic obstructive pulmonary disease with (acute) exacerbation: Principal | ICD-10-CM | POA: Diagnosis present

## 2014-08-25 DIAGNOSIS — N3000 Acute cystitis without hematuria: Secondary | ICD-10-CM | POA: Diagnosis present

## 2014-08-25 DIAGNOSIS — J9601 Acute respiratory failure with hypoxia: Secondary | ICD-10-CM | POA: Diagnosis not present

## 2014-08-25 DIAGNOSIS — E785 Hyperlipidemia, unspecified: Secondary | ICD-10-CM | POA: Diagnosis present

## 2014-08-25 DIAGNOSIS — K219 Gastro-esophageal reflux disease without esophagitis: Secondary | ICD-10-CM | POA: Diagnosis present

## 2014-08-25 DIAGNOSIS — Z9049 Acquired absence of other specified parts of digestive tract: Secondary | ICD-10-CM | POA: Diagnosis present

## 2014-08-25 DIAGNOSIS — Z7951 Long term (current) use of inhaled steroids: Secondary | ICD-10-CM

## 2014-08-25 DIAGNOSIS — K589 Irritable bowel syndrome without diarrhea: Secondary | ICD-10-CM | POA: Diagnosis present

## 2014-08-25 DIAGNOSIS — Z811 Family history of alcohol abuse and dependence: Secondary | ICD-10-CM

## 2014-08-25 DIAGNOSIS — E119 Type 2 diabetes mellitus without complications: Secondary | ICD-10-CM | POA: Diagnosis present

## 2014-08-25 DIAGNOSIS — J449 Chronic obstructive pulmonary disease, unspecified: Secondary | ICD-10-CM

## 2014-08-25 DIAGNOSIS — Z7982 Long term (current) use of aspirin: Secondary | ICD-10-CM

## 2014-08-25 DIAGNOSIS — Z888 Allergy status to other drugs, medicaments and biological substances status: Secondary | ICD-10-CM | POA: Diagnosis not present

## 2014-08-25 DIAGNOSIS — F419 Anxiety disorder, unspecified: Secondary | ICD-10-CM | POA: Diagnosis present

## 2014-08-25 DIAGNOSIS — I10 Essential (primary) hypertension: Secondary | ICD-10-CM | POA: Diagnosis present

## 2014-08-25 DIAGNOSIS — M797 Fibromyalgia: Secondary | ICD-10-CM | POA: Diagnosis present

## 2014-08-25 DIAGNOSIS — Z809 Family history of malignant neoplasm, unspecified: Secondary | ICD-10-CM | POA: Diagnosis not present

## 2014-08-25 DIAGNOSIS — Z8673 Personal history of transient ischemic attack (TIA), and cerebral infarction without residual deficits: Secondary | ICD-10-CM

## 2014-08-25 DIAGNOSIS — F329 Major depressive disorder, single episode, unspecified: Secondary | ICD-10-CM | POA: Diagnosis present

## 2014-08-25 LAB — BASIC METABOLIC PANEL
ANION GAP: 11 (ref 5–15)
BUN: 8 mg/dL (ref 6–20)
CALCIUM: 8.8 mg/dL — AB (ref 8.9–10.3)
CO2: 32 mmol/L (ref 22–32)
Chloride: 95 mmol/L — ABNORMAL LOW (ref 101–111)
Creatinine, Ser: 0.7 mg/dL (ref 0.44–1.00)
GFR calc Af Amer: >60 mL/min (ref >60)
GFR calc non Af Amer: >60 mL/min (ref >60)
Glucose, Bld: 125 mg/dL — ABNORMAL HIGH (ref 65–99)
Potassium: 3.3 mmol/L — ABNORMAL LOW (ref 3.5–5.1)
Sodium: 138 mmol/L (ref 135–145)

## 2014-08-25 LAB — GLUCOSE, CAPILLARY
Glucose-Capillary: 200 mg/dL — ABNORMAL HIGH (ref 65–99)
Glucose-Capillary: 205 mg/dL — ABNORMAL HIGH (ref 65–99)

## 2014-08-25 LAB — URINALYSIS COMPLETE WITH MICROSCOPIC (ARMC ONLY)
BILIRUBIN URINE: NEGATIVE
Bacteria, UA: NONE SEEN
Glucose, UA: NEGATIVE mg/dL
Hgb urine dipstick: NEGATIVE
KETONES UR: NEGATIVE mg/dL
NITRITE: NEGATIVE
PROTEIN: NEGATIVE mg/dL
SPECIFIC GRAVITY, URINE: 1.01 (ref 1.005–1.030)
pH: 6 (ref 5.0–8.0)

## 2014-08-25 LAB — CBC
HCT: 38.5 % (ref 35.0–47.0)
Hemoglobin: 12.8 g/dL (ref 12.0–16.0)
MCH: 30.2 pg (ref 26.0–34.0)
MCHC: 33.4 g/dL (ref 32.0–36.0)
MCV: 90.5 fL (ref 80.0–100.0)
Platelets: 160 10*3/uL (ref 150–440)
RBC: 4.25 MIL/uL (ref 3.80–5.20)
RDW: 14.2 % (ref 11.5–14.5)
WBC: 10.2 10*3/uL (ref 3.6–11.0)

## 2014-08-25 MED ORDER — SODIUM CHLORIDE 0.9 % IV SOLN: 250.0000 mL | INTRAVENOUS | Status: DC | PRN

## 2014-08-25 MED ORDER — ONDANSETRON HCL 4 MG PO TABS: 4.0000 mg | ORAL_TABLET | Freq: Four times a day (QID) | ORAL | Status: DC | PRN

## 2014-08-25 MED ORDER — LEVOFLOXACIN IN D5W 500 MG/100ML IV SOLN
500.0000 mg | INTRAVENOUS | Status: DC
  Administered 2014-08-25: 500 mg via INTRAVENOUS
  Filled 2014-08-25 (×3): qty 100

## 2014-08-25 MED ORDER — DULOXETINE HCL 30 MG PO CPEP
30.0000 mg | ORAL_CAPSULE | Freq: Two times a day (BID) | ORAL | Status: DC
  Administered 2014-08-25 – 2014-08-26 (×3): 30 mg via ORAL
  Filled 2014-08-25 (×3): qty 1

## 2014-08-25 MED ORDER — SODIUM CHLORIDE 0.9 % IV BOLUS (SEPSIS)
1000.0000 mL | Freq: Once | INTRAVENOUS | Status: AC
  Administered 2014-08-25: 1000 mL via INTRAVENOUS

## 2014-08-25 MED ORDER — ONDANSETRON HCL 4 MG/2ML IJ SOLN
4.0000 mg | Freq: Once | INTRAMUSCULAR | Status: AC
  Administered 2014-08-25: 4 mg via INTRAVENOUS
  Filled 2014-08-25: qty 2

## 2014-08-25 MED ORDER — AZITHROMYCIN 250 MG PO TABS: 500.0000 mg | ORAL_TABLET | Freq: Every day | ORAL | Status: DC

## 2014-08-25 MED ORDER — SODIUM CHLORIDE 0.9 % IJ SOLN
3.0000 mL | Freq: Two times a day (BID) | INTRAMUSCULAR | Status: DC
  Administered 2014-08-25 – 2014-08-26 (×3): 3 mL via INTRAVENOUS

## 2014-08-25 MED ORDER — MOMETASONE FURO-FORMOTEROL FUM 100-5 MCG/ACT IN AERO
2.0000 | INHALATION_SPRAY | Freq: Two times a day (BID) | RESPIRATORY_TRACT | Status: DC
  Administered 2014-08-25 – 2014-08-26 (×2): 2 via RESPIRATORY_TRACT
  Filled 2014-08-25: qty 8.8

## 2014-08-25 MED ORDER — GUAIFENESIN-DM 100-10 MG/5ML PO SYRP: 5.0000 mL | ORAL_SOLUTION | ORAL | Status: DC | PRN

## 2014-08-25 MED ORDER — IPRATROPIUM-ALBUTEROL 0.5-2.5 (3) MG/3ML IN SOLN: 3.0000 mL | RESPIRATORY_TRACT | Status: DC | PRN

## 2014-08-25 MED ORDER — DEXTROSE 5 % IV SOLN: 1.0000 g | Freq: Once | INTRAVENOUS | Status: DC

## 2014-08-25 MED ORDER — ATORVASTATIN CALCIUM 20 MG PO TABS
40.0000 mg | ORAL_TABLET | Freq: Every day | ORAL | Status: DC
  Administered 2014-08-25 – 2014-08-26 (×2): 40 mg via ORAL
  Filled 2014-08-25 (×2): qty 2

## 2014-08-25 MED ORDER — ONDANSETRON HCL 4 MG/2ML IJ SOLN: 4.0000 mg | Freq: Four times a day (QID) | INTRAMUSCULAR | Status: DC | PRN

## 2014-08-25 MED ORDER — MORPHINE SULFATE 15 MG PO TABS
15.0000 mg | ORAL_TABLET | Freq: Three times a day (TID) | ORAL | Status: DC | PRN
Start: 2014-08-25 — End: 2014-08-26

## 2014-08-25 MED ORDER — ACETAMINOPHEN 650 MG RE SUPP: 650.0000 mg | Freq: Four times a day (QID) | RECTAL | Status: DC | PRN

## 2014-08-25 MED ORDER — CLONAZEPAM 1 MG PO TABS
1.0000 mg | ORAL_TABLET | Freq: Three times a day (TID) | ORAL | Status: DC
  Administered 2014-08-25 – 2014-08-26 (×3): 1 mg via ORAL
  Filled 2014-08-25 (×3): qty 1

## 2014-08-25 MED ORDER — ASPIRIN EC 81 MG PO TBEC
81.0000 mg | DELAYED_RELEASE_TABLET | Freq: Every day | ORAL | Status: DC
Start: 2014-08-25 — End: 2014-08-26
  Administered 2014-08-26: 09:00:00 81 mg via ORAL
  Filled 2014-08-25 (×2): qty 1

## 2014-08-25 MED ORDER — PANTOPRAZOLE SODIUM 40 MG PO TBEC
40.0000 mg | DELAYED_RELEASE_TABLET | Freq: Every day | ORAL | Status: DC
  Administered 2014-08-25 – 2014-08-26 (×2): 40 mg via ORAL
  Filled 2014-08-25 (×2): qty 1

## 2014-08-25 MED ORDER — ACETAMINOPHEN 325 MG PO TABS: 650.0000 mg | ORAL_TABLET | Freq: Four times a day (QID) | ORAL | Status: DC | PRN

## 2014-08-25 MED ORDER — ENOXAPARIN SODIUM 40 MG/0.4ML ~~LOC~~ SOLN
40.0000 mg | SUBCUTANEOUS | Status: DC
  Administered 2014-08-25: 40 mg via SUBCUTANEOUS
  Filled 2014-08-25 (×2): qty 0.4

## 2014-08-25 MED ORDER — FLUTICASONE PROPIONATE 50 MCG/ACT NA SUSP
2.0000 | Freq: Every day | NASAL | Status: DC
  Administered 2014-08-26: 09:00:00 2 via NASAL
  Filled 2014-08-25: qty 16

## 2014-08-25 MED ORDER — IPRATROPIUM-ALBUTEROL 0.5-2.5 (3) MG/3ML IN SOLN: 3.0000 mL | RESPIRATORY_TRACT | Status: DC

## 2014-08-25 MED ORDER — MIRTAZAPINE 15 MG PO TABS
15.0000 mg | ORAL_TABLET | Freq: Every day | ORAL | Status: DC
  Administered 2014-08-25: 21:00:00 15 mg via ORAL
  Filled 2014-08-25 (×2): qty 1

## 2014-08-25 MED ORDER — SODIUM CHLORIDE 0.9 % IV BOLUS (SEPSIS)
1000.0000 mL | Freq: Once | INTRAVENOUS | Status: AC
  Administered 2014-08-25: 1000 mL via INTRAVENOUS
  Filled 2014-08-25: qty 1000

## 2014-08-25 MED ORDER — INSULIN ASPART 100 UNIT/ML ~~LOC~~ SOLN
0.0000 [IU] | Freq: Three times a day (TID) | SUBCUTANEOUS | Status: DC
  Administered 2014-08-25 – 2014-08-26 (×2): 2 [IU] via SUBCUTANEOUS
  Administered 2014-08-26: 1 [IU] via SUBCUTANEOUS
  Filled 2014-08-25: qty 1
  Filled 2014-08-25 (×2): qty 2

## 2014-08-25 MED ORDER — DEXTROSE 5 % IV SOLN
INTRAVENOUS | Status: AC
  Filled 2014-08-25: qty 10

## 2014-08-25 MED ORDER — METHYLPREDNISOLONE SODIUM SUCC 125 MG IJ SOLR
60.0000 mg | Freq: Every day | INTRAMUSCULAR | Status: DC
  Administered 2014-08-26: 60 mg via INTRAVENOUS
  Filled 2014-08-25: qty 2

## 2014-08-25 MED ORDER — PREDNISONE 20 MG PO TABS
60.0000 mg | ORAL_TABLET | Freq: Once | ORAL | Status: AC
  Administered 2014-08-25: 60 mg via ORAL
  Filled 2014-08-25: qty 3

## 2014-08-25 MED ORDER — SODIUM CHLORIDE 0.9 % IJ SOLN: 3.0000 mL | INTRAMUSCULAR | Status: DC | PRN

## 2014-08-25 MED ORDER — QUETIAPINE FUMARATE 200 MG PO TABS
200.0000 mg | ORAL_TABLET | Freq: Every day | ORAL | Status: DC
  Administered 2014-08-25: 21:00:00 200 mg via ORAL
  Filled 2014-08-25: qty 1

## 2014-08-25 MED ORDER — METOPROLOL SUCCINATE ER 25 MG PO TB24
25.0000 mg | ORAL_TABLET | Freq: Every day | ORAL | Status: DC
  Administered 2014-08-25 – 2014-08-26 (×2): 25 mg via ORAL
  Filled 2014-08-25 (×2): qty 1

## 2014-08-25 MED ORDER — IPRATROPIUM-ALBUTEROL 0.5-2.5 (3) MG/3ML IN SOLN
3.0000 mL | RESPIRATORY_TRACT | Status: AC
  Administered 2014-08-25 (×3): 3 mL via RESPIRATORY_TRACT
  Filled 2014-08-25 (×3): qty 3

## 2014-08-25 MED ORDER — TIOTROPIUM BROMIDE MONOHYDRATE 18 MCG IN CAPS
18.0000 ug | ORAL_CAPSULE | Freq: Every day | RESPIRATORY_TRACT | Status: DC
  Administered 2014-08-26: 18 ug via RESPIRATORY_TRACT
  Filled 2014-08-25: qty 5

## 2014-08-25 NOTE — ED Notes (Signed)
Pt reports she has recently had SOB, dizziness with any activity.  Went to [facility] this am and oxygen level was low Pt reports fever this am.  Pt also complains of burning with urination.  Pt placed on 2L [place] upon arrival to room and on cardiac monitoring.

## 2014-08-25 NOTE — Plan of Care (Signed)
Problem: Discharge Progression Outcomes Goal: Hemodynamically stable Outcome: Progressing Patient new admission for COPD exacerbation, first seen today at [facility] c/o weakness and shortness of breath,  Patient has been coughing up think green sputum for a couple of days, Patient does not wear oxygen at home but was hypoxic and is requiring 2 liters [place].  Patient has been tachy since admission, breathing treatments changed to PRN.  Patient alert oriented, instructed to call for assistance due to weakness.

## 2014-08-25 NOTE — ED Notes (Addendum)
Patient was being seen this morning at [facility] for a checkup. Patient was 82 on room air. Patient was placed on 2L O2 and brought over to Ed. Patient has COPD and not currently on oxygen at home. Patient also c/o discomfort/burning when urinating.

## 2014-08-25 NOTE — H&P (Signed)
[facility] [place] NAME: Beth [name]    MR#:  [id number]  DATE OF BIRTH:  Nov 11, 1945  DATE OF ADMISSION:  08/25/2014  PRIMARY CARE PHYSICIAN: Loistine Chance, MD   REQUESTING/REFERRING PHYSICIAN: Brenton Grills MD  CHIEF COMPLAINT:   Chief Complaint  Patient presents with  . COPD    HISTORY OF PRESENT ILLNESS:  Beth [name]  is a 69 y.o. female with a known history of CVA, hypertension, COPD presents to the emergency room complaining of worsening shortness of breath and cough. Also mild dysuria. Initially noticed symptoms of sinusitis start gradually 2 weeks back. Saw primary care physician. Symptoms have not improved and presented to the emergency room. Her saturations were 87% on room air with significant wheezing and increased work of breathing. Multiple nebulizer therapy given in the emergency room with no significant improvement and is being admitted to the hospitalist service for further management. Also found to have a UTI. Cough is nonproductive. No orthopnea. No chest pain. Patient mentions that she had fever 101.5 at home and here it is 19F.  PAST MEDICAL HISTORY:   Past Medical History  Diagnosis Date  . Vitamin D deficiency   . IBS (irritable bowel syndrome)   . COPD (chronic obstructive pulmonary disease)   . Hypertension   . Allergy   . CVA (cerebral infarction)   . Headache   . Fibromyalgia   . Hyperlipidemia   . Anxiety   . Depression   . Diabetes mellitus, type II   . Stroke     PAST SURGICAL HISTORY:   Past Surgical History  Procedure Laterality Date  . Breast surgery  2011    biopsy  . Cervical discectomy    . Appendectomy    . Abdominal hysterectomy    . Cholecystectomy    . Sinusotomy      SOCIAL HISTORY:   History  Substance Use Topics  . Smoking status: Former Smoker -- 1.00 packs/day for 35 years    Types: Cigarettes    Quit date: 01/29/2012  . Smokeless tobacco: Never  Used  . Alcohol Use: No    FAMILY HISTORY:   Family History  Problem Relation Age of Onset  . Anxiety disorder Mother   . Depression Mother   . Cancer Father   . Gallbladder disease Father   . Alcohol abuse Father   . Depression Father     DRUG ALLERGIES:   Allergies  Allergen Reactions  . Bextra  [Valdecoxib]   . Compazine  [Prochlorperazine Edisylate]   . Lithium Carbonate   . Lyrica [Pregabalin]     REVIEW OF SYSTEMS:   Review of Systems  Constitutional: Negative for fever, chills and weight loss.  HENT: Negative for hearing loss and nosebleeds.   Eyes: Negative for blurred vision, double vision and pain.  Respiratory: Positive for cough, shortness of breath and wheezing. Negative for hemoptysis and sputum production.   Cardiovascular: Negative for chest pain, palpitations, orthopnea and leg swelling.  Gastrointestinal: Positive for nausea. Negative for vomiting, abdominal pain, diarrhea and constipation.  Genitourinary: Negative for dysuria and hematuria.  Musculoskeletal: Negative for myalgias, back pain and falls.  Skin: Negative for rash.  Neurological: Positive for weakness. Negative for dizziness, tremors, sensory change, speech change, focal weakness, seizures and headaches.  Endo/Heme/Allergies: Does not bruise/bleed easily.  Psychiatric/Behavioral: Negative for depression and memory loss. The patient is nervous/anxious and has insomnia.     MEDICATIONS AT HOME:  Prior to Admission medications   Medication Sig Start Date End Date Taking? Authorizing Provider  atorvastatin (LIPITOR) 40 MG tablet Take 1 tablet by mouth daily. 04/10/14  Yes Historical Provider, MD  Fluticasone-Salmeterol (ADVAIR DISKUS) 250-50 MCG/DOSE AEPB Inhale 1 puff into the lungs 2 (two) times daily. 08/09/14  Yes Steele Sizer, MD  morphine (MSIR) 15 MG tablet Take 15 mg by mouth every 8 (eight) hours as needed. for pain 07/23/14  Yes Historical Provider, MD  QUEtiapine (SEROQUEL) 200  MG tablet Take 200 mg by mouth at bedtime.   Yes Historical Provider, MD  tiotropium (SPIRIVA HANDIHALER) 18 MCG inhalation capsule Place 1 capsule (18 mcg total) into inhaler and inhale daily. 08/09/14  Yes Steele Sizer, MD  albuterol (VENTOLIN HFA) 108 (90 BASE) MCG/ACT inhaler Inhale 2 puffs into the lungs every 4 (four) hours as needed. 11/22/13   Historical Provider, MD  aspirin 81 MG tablet Take 1 tablet by mouth daily.    Historical Provider, MD  clonazePAM (KLONOPIN) 1 MG tablet Take 1 tablet (1 mg total) by mouth 3 (three) times daily. 08/18/14   Marjie Skiff, MD  DULoxetine (CYMBALTA) 30 MG capsule Take 1 capsule (30 mg total) by mouth 2 (two) times daily. 08/18/14   Marjie Skiff, MD  fluconazole (DIFLUCAN) 150 MG tablet Take 1 tablet (150 mg total) by mouth every other day. 08/09/14   Steele Sizer, MD  fluticasone (FLONASE) 50 MCG/ACT nasal spray Place 2 sprays into the nose daily. 06/11/11   Historical Provider, MD  metoprolol succinate (TOPROL-XL) 25 MG 24 hr tablet Take 25 mg by mouth daily. 07/19/14   Historical Provider, MD  mirtazapine (REMERON) 15 MG tablet Take 1 tablet (15 mg total) by mouth at bedtime. 08/18/14   Marjie Skiff, MD  omeprazole (PRILOSEC) 20 MG capsule Take 1 capsule (20 mg total) by mouth daily. 08/09/14   Steele Sizer, MD      VITAL SIGNS:  Blood pressure 138/68, pulse 107, temperature 99 F (37.2 C), temperature source Oral, resp. rate 15, height 5\' 5"  (1.651 m), weight 68.04 kg (150 lb), SpO2 96 %.  PHYSICAL EXAMINATION:  Physical Exam  GENERAL:  69 y.o.-year-old patient lying in the bed with mild respiratory distress.  EYES: Pupils equal, round, reactive to light and accommodation. No scleral icterus. Extraocular muscles intact.  HEENT: Head atraumatic, normocephalic. Oropharynx and nasopharynx clear. No oropharyngeal erythema, moist oral mucosa  NECK:  Supple, no jugular venous distention. No thyroid enlargement, no tenderness.  LUNGS:  no  rales, rhonchi. No use of accessory muscles of respiration. Wheezing bilaterally with decreased breath sounds CARDIOVASCULAR: S1, S2 normal. No murmurs, rubs, or gallops.  ABDOMEN: Soft, nontender, nondistended. Bowel sounds present. No organomegaly or mass.  EXTREMITIES: No pedal edema, cyanosis, or clubbing. + 2 pedal & radial pulses b/l.   NEUROLOGIC: Cranial nerves II through XII are intact. No focal  sensory deficits appreciated b/l. Motor strength decreased in left lower extremity from prior stroke. PSYCHIATRIC: The patient is alert and oriented x 3. Pleasant SKIN: No obvious rash, lesion, or ulcer.   LABORATORY PANEL:   CBC  Recent Labs Lab 08/25/14 1031  WBC 10.2  HGB 12.8  HCT 38.5  PLT 160   ------------------------------------------------------------------------------------------------------------------  Chemistries   Recent Labs Lab 08/25/14 1031  NA 138  K 3.3*  CL 95*  CO2 32  GLUCOSE 125*  BUN 8  CREATININE 0.70  CALCIUM 8.8*   ------------------------------------------------------------------------------------------------------------------  Cardiac Enzymes No results for input(s):  TROPONINI in the last 168 hours. ------------------------------------------------------------------------------------------------------------------  RADIOLOGY:  Dg Chest 2 View  08/25/2014   CLINICAL DATA:  Cough and fever for 2 weeks  EXAM: CHEST  2 VIEW  COMPARISON:  December 20, 2012  FINDINGS: There is bibasilar atelectatic change. Lungs elsewhere clear. Heart size and pulmonary vascularity are normal. No adenopathy. There is postoperative change in the lower cervical region. There is fairly marked anterior wedging of the T12 vertebral body, stable.  IMPRESSION: Bibasilar atelectatic change. No edema or consolidation. Heart size within normal limits.   Electronically Signed   By: Lowella Grip III M.D.   On: 08/25/2014 11:27     IMPRESSION AND PLAN:   * COPD  exacerbation -IV steroids, Antibiotics - Scheduled Nebulizers - Inhalers -Wean O2 as tolerated - Consult pulmonary if no improvement  * Acute respiratory failure, Hypoxic O2 for sats >92%. May need home O2  * UTI On antibiotics  * Sinus tachycardia Likely from her COPD exacerbation. Also contributed by albuterol nebulizers given in the emergency room.  * DM Patient mentions she is a prediabetic. Blood sugars are expected to run high due to IV steroid-induced. Start sliding scale insulin.  * HTN Continue home medications  * Anxiety  * DVT prophylaxis with Lovenox  All the records are reviewed and case discussed with ED provider. Management plans discussed with the patient, family and they are in agreement.  CODE STATUS: FULL  TOTAL TIME TAKING CARE OF THIS PATIENT: 45 minutes.    Hillary Bow R M.D on 08/25/2014 at 12:50 PM  Between 7am to 6pm - Pager - [phone]  After 6pm go to [email or web address] - password [email or web address] [facility]  Office  [phone]  CC: Primary care physician; Loistine Chance, MD

## 2014-08-25 NOTE — ED Provider Notes (Signed)
[facility] Emergency Department Provider Note  ____________________________________________  Time seen: 10:20 AM  I have reviewed the triage vital signs and the nursing notes.   HISTORY  Chief Complaint COPD    HPI TAUNI SANKS is a 69 y.o. female who complains of shortness of breath for about 2 weeks that's been worsening. She is also having dysuria and urgency for the past 2 weeks as well. She reports over last 3 days her she is also having a lot of upper airway congestion and her breathing got much worse. She recently ran out of some of her home medications. She also reports a fever to 101.5 this morning  Denies chest pain dizziness or syncope. She's had decreased appetite but is drinking fluids.     Past Medical History  Diagnosis Date  . Vitamin D deficiency   . IBS (irritable bowel syndrome)   . COPD (chronic obstructive pulmonary disease)   . Hypertension   . Allergy   . CVA (cerebral infarction)   . Headache   . Fibromyalgia   . Hyperlipidemia   . Anxiety   . Depression   . Diabetes mellitus, type II   . Stroke     Patient Active Problem List   Diagnosis Date Noted  . Chronic LBP 08/09/2014  . Colon polyp 08/09/2014  . COPD, severe 08/09/2014  . CVA, old, hemiparesis 08/09/2014  . Dyslipidemia 08/09/2014  . Dysfunction of eustachian tube 08/09/2014  . Fibromyalgia syndrome 08/09/2014  . Gastro-esophageal reflux disease without esophagitis 08/09/2014  . Benign migrating glossitis 08/09/2014  . Cerebrovascular accident, old 08/09/2014  . IBS (irritable bowel syndrome) 08/09/2014  . Low back pain with radiation 08/09/2014  . Chronic recurrent major depressive disorder 08/09/2014  . Dysmetabolic syndrome 50/53/9767  . OP (osteoporosis) 08/09/2014  . Vitamin D deficiency 08/09/2014  . Tobacco use disorder 08/09/2014  . Gastroesophageal reflux disease without esophagitis 08/09/2014  . Benign hypertension 07/19/2013  . Benign  neoplasm of skin of trunk 06/03/2013  . H/O: pneumonia 09/25/2012    Past Surgical History  Procedure Laterality Date  . Breast surgery  2011    biopsy  . Cervical discectomy    . Appendectomy    . Abdominal hysterectomy    . Cholecystectomy    . Sinusotomy      Current Outpatient Rx  Name  Route  Sig  Dispense  Refill  . atorvastatin (LIPITOR) 40 MG tablet   Oral   Take 1 tablet by mouth daily.         . Fluticasone-Salmeterol (ADVAIR DISKUS) 250-50 MCG/DOSE AEPB   Inhalation   Inhale 1 puff into the lungs 2 (two) times daily.   60 each   5   . morphine (MSIR) 15 MG tablet   Oral   Take 15 mg by mouth every 8 (eight) hours as needed. for pain      0   . QUEtiapine (SEROQUEL) 200 MG tablet   Oral   Take 200 mg by mouth at bedtime.         Marland Kitchen tiotropium (SPIRIVA HANDIHALER) 18 MCG inhalation capsule   Inhalation   Place 1 capsule (18 mcg total) into inhaler and inhale daily.   30 capsule   5   . albuterol (VENTOLIN HFA) 108 (90 BASE) MCG/ACT inhaler   Inhalation   Inhale 2 puffs into the lungs every 4 (four) hours as needed.         Marland Kitchen aspirin 81 MG tablet  Oral   Take 1 tablet by mouth daily.         . clonazePAM (KLONOPIN) 1 MG tablet   Oral   Take 1 tablet (1 mg total) by mouth 3 (three) times daily.   90 tablet   1   . DULoxetine (CYMBALTA) 30 MG capsule   Oral   Take 1 capsule (30 mg total) by mouth 2 (two) times daily.   60 capsule   2   . fluconazole (DIFLUCAN) 150 MG tablet   Oral   Take 1 tablet (150 mg total) by mouth every other day.   3 tablet   0   . fluticasone (FLONASE) 50 MCG/ACT nasal spray   Nasal   Place 2 sprays into the nose daily.         . metoprolol succinate (TOPROL-XL) 25 MG 24 hr tablet   Oral   Take 25 mg by mouth daily.      3   . mirtazapine (REMERON) 15 MG tablet   Oral   Take 1 tablet (15 mg total) by mouth at bedtime.   30 tablet   2   . omeprazole (PRILOSEC) 20 MG capsule   Oral   Take 1  capsule (20 mg total) by mouth daily.   30 capsule   5     Allergies Bextra ; Compazine ; Lithium carbonate; and Lyrica  Family History  Problem Relation Age of Onset  . Anxiety disorder Mother   . Depression Mother   . Cancer Father   . Gallbladder disease Father   . Alcohol abuse Father   . Depression Father     Social History History  Substance Use Topics  . Smoking status: Former Smoker -- 1.00 packs/day for 35 years    Types: Cigarettes    Quit date: 01/29/2012  . Smokeless tobacco: Never Used  . Alcohol Use: No    Review of Systems  Constitutional: Positive fever and chills. No weight changes Eyes:No blurry vision or double vision.  ENT: Positive sinus congestion. Cardiovascular: No chest pain. Respiratory: Positive shortness of breath with nonproductive cough. Gastrointestinal: Negative for abdominal pain, vomiting and diarrhea.  No BRBPR or melena. Genitourinary: Is of dysuria and urinary urgency. Musculoskeletal: Negative for back pain. No joint swelling or pain. Skin: Negative for rash. Neurological: Negative for headaches, focal weakness or numbness. Psychiatric:No anxiety or depression.   Endocrine:No hot/cold intolerance, changes in energy, or sleep difficulty.  10-point ROS otherwise negative.  ____________________________________________   PHYSICAL EXAM:  VITAL SIGNS: ED Triage Vitals  Enc Vitals Group     BP 08/25/14 0957 143/60 mmHg     Pulse Rate 08/25/14 0957 112     Resp 08/25/14 0957 18     Temp 08/25/14 0957 99 F (37.2 C)     Temp Source 08/25/14 0957 Oral     SpO2 08/25/14 0957 99 % on 2 L nasal cannula      Weight 08/25/14 0957 150 lb (68.04 kg)     Height 08/25/14 0957 5\' 5"  (1.651 m)     Head Cir --      Peak Flow --      Pain Score --      Pain Loc --      Pain Edu? --      Excl. in [place]? --   Room air oxygen saturation 82%   Constitutional: Alert and oriented. Mild distress Eyes: No scleral icterus. No conjunctival  pallor. PERRL. EOMI ENT   Head:  Normocephalic and atraumatic.   Nose: No congestion/rhinnorhea. No septal hematoma   Mouth/Throat: MMM, no pharyngeal erythema. No peritonsillar mass. No uvula shift.   Neck: No stridor. No SubQ emphysema. No meningismus. Hematological/Lymphatic/Immunilogical: No cervical lymphadenopathy. Cardiovascular: Tachycardia heart rate 110. Normal and symmetric distal pulses are present in all extremities. No murmurs, rubs, or gallops. Respiratory: Increased work of breathing, diffuse wheezing on forceful expiration. No crackles or apparent consolidation. Gastrointestinal: Suprapubic tenderness. No distention. There is no CVA tenderness.  No rebound, rigidity, or guarding. Genitourinary: deferred Musculoskeletal: Nontender with normal range of motion in all extremities. No joint effusions.  No lower extremity tenderness.  No edema. Neurologic:   Normal speech and language.  CN 2-10 normal. Motor grossly intact. No pronator drift.  Normal gait. No gross focal neurologic deficits are appreciated.  Skin:  Skin is warm, dry and intact. No rash noted.  No petechiae, purpura, or bullae. Psychiatric: Mood and affect are normal. Speech and behavior are normal. Patient exhibits appropriate insight and judgment.  ____________________________________________    LABS (pertinent positives/negatives) (all labs ordered are listed, but only abnormal results are displayed) Labs Reviewed  BASIC METABOLIC PANEL - Abnormal; Notable for the following:    Potassium 3.3 (*)    Chloride 95 (*)    Glucose, Bld 125 (*)    Calcium 8.8 (*)    All other components within normal limits  URINALYSIS COMPLETEWITH MICROSCOPIC ([facility] ONLY) - Abnormal; Notable for the following:    Color, Urine YELLOW (*)    APPearance HAZY (*)    Leukocytes, UA 3+ (*)    Squamous Epithelial / LPF 6-30 (*)    All other components within normal limits  URINE CULTURE  CBC    ____________________________________________   EKG  Interpreted by me Sinus tachycardia rate 106. Normal axis intervals QRS and ST segments and T waves.  ____________________________________________    RADIOLOGY  Chest x-ray unremarkable  ____________________________________________   PROCEDURES CRITICAL CARE Performed by: Joni Fears, Areona Homer   Total critical care time: 35 minutes  Critical care time was exclusive of separately billable procedures and treating other patients.  Critical care was necessary to treat or prevent imminent or life-threatening deterioration.  Critical care was time spent personally by me on the following activities: development of treatment plan with patient and/or surrogate as well as nursing, discussions with consultants, evaluation of patient's response to treatment, examination of patient, obtaining history from patient or surrogate, ordering and performing treatments and interventions, ordering and review of laboratory studies, ordering and review of radiographic studies, pulse oximetry and re-evaluation of patient's condition.  ____________________________________________   INITIAL IMPRESSION / ASSESSMENT AND PLAN / ED COURSE  Pertinent labs & imaging results that were available during my care of the patient were reviewed by me and considered in my medical decision making (see chart for details).  Patient presents with SIRS criteria with fever or tachycardia and hypoxia. We'll give prednisone and 3 DuoNeb's and nasal cannula oxygen to maintain adequate oxygenation while pursuing a workup.  ----------------------------------------- 12:47 PM on 08/25/2014 -----------------------------------------  Room air oxygen level remained 82% despite nebs and steroids. Workup reveals urinary tract infection for which I ordered the patient IV ceftriaxone. We'll continue IV fluids for tachycardia and sepsis in the setting of urinary tract infection.  Patient is also to be admitted for acute hypoxic respiratory failure in the setting of a COPD exacerbation.  ____________________________________________   FINAL CLINICAL IMPRESSION(S) / ED DIAGNOSES  Final diagnoses:  COPD, severe  Acute respiratory  failure with hypoxia   early sepsis secondary to urinary tract infection    Carrie Mew, MD 08/25/14 1248

## 2014-08-25 NOTE — Progress Notes (Signed)
ANTIBIOTIC CONSULT NOTE - INITIAL  Pharmacy Consult for levofloxacin Indication: COPD exacerbation and UTI  Allergies  Allergen Reactions  . Bextra  [Valdecoxib]   . Compazine  [Prochlorperazine Edisylate]   . Lithium Carbonate   . Lyrica [Pregabalin]     Patient Measurements: Height: 5\' 5"  (165.1 cm) Weight: 150 lb (68.04 kg) IBW/kg (Calculated) : 57   Vital Signs: Temp: 99 F (37.2 C) (07/28 0957) Temp Source: Oral (07/28 0957) BP: 138/68 mmHg (07/28 1230) Pulse Rate: 107 (07/28 1154) Intake/Output from previous day:   Intake/Output from this shift:    Labs:  Recent Labs  08/25/14 1031  WBC 10.2  HGB 12.8  PLT 160  CREATININE 0.70   Estimated Creatinine Clearance: 58.9 mL/min (by C-G formula based on Cr of 0.7). No results for input(s): VANCOTROUGH, VANCOPEAK, VANCORANDOM, GENTTROUGH, GENTPEAK, GENTRANDOM, TOBRATROUGH, TOBRAPEAK, TOBRARND, AMIKACINPEAK, AMIKACINTROU, AMIKACIN in the last 72 hours.   Microbiology: No results found for this or any previous visit (from the past 720 hour(s)).  Medical History: Past Medical History  Diagnosis Date  . Vitamin D deficiency   . IBS (irritable bowel syndrome)   . COPD (chronic obstructive pulmonary disease)   . Hypertension   . Allergy   . CVA (cerebral infarction)   . Headache   . Fibromyalgia   . Hyperlipidemia   . Anxiety   . Depression   . Diabetes mellitus, type II   . Stroke      Assessment: 69 yo female admitted with COPD exacerbation and UTI  Plan:  Will order levofloxacin 500 mg IV q24h at this time.  Rayna Sexton L 08/25/2014,1:18 PM

## 2014-08-26 LAB — GLUCOSE, CAPILLARY
Glucose-Capillary: 124 mg/dL — ABNORMAL HIGH (ref 65–99)
Glucose-Capillary: 160 mg/dL — ABNORMAL HIGH (ref 65–99)

## 2014-08-26 MED ORDER — PREDNISONE 5 MG PO TABS
5.0000 mg | ORAL_TABLET | Freq: Every day | ORAL | Status: DC
Start: 2014-08-26 — End: 2014-09-27

## 2014-08-26 MED ORDER — LEVOFLOXACIN 500 MG PO TABS: 500.0000 mg | ORAL_TABLET | Freq: Every day | ORAL | Status: DC

## 2014-08-26 MED ORDER — POTASSIUM CHLORIDE CRYS ER 20 MEQ PO TBCR
40.0000 meq | EXTENDED_RELEASE_TABLET | Freq: Once | ORAL | Status: AC
  Administered 2014-08-26: 13:00:00 40 meq via ORAL
  Filled 2014-08-26: qty 2

## 2014-08-26 NOTE — Progress Notes (Signed)
Pts O2 sats at rest 96%, during ambulation o2 decrease to 91%, no SOB reported. Md notified

## 2014-08-26 NOTE — Plan of Care (Signed)
Problem: Discharge Progression Outcomes Goal: Other Discharge Outcomes/Goals Outcome: Progressing Patient is alert and oriented, no c/o pain at this time. Standby assist to the bathroom. Continues on 2 L [place], resting quietly.

## 2014-08-26 NOTE — Discharge Summary (Signed)
[facility] [place] NAME: Beth [name]    MR#:  [id number]  DATE OF BIRTH:  [birth year]  DATE OF ADMISSION:  08/25/2014 ADMITTING PHYSICIAN: Hillary Bow, MD  DATE OF DISCHARGE: 08/26/2014  PRIMARY CARE PHYSICIAN: Loistine Chance, MD    ADMISSION DIAGNOSIS:  Acute respiratory failure with hypoxia [J96.01] COPD, severe [J44.9]  DISCHARGE DIAGNOSIS:  Active Problems:   COPD exacerbation   SECONDARY DIAGNOSIS:   Past Medical History  Diagnosis Date  . Vitamin D deficiency   . IBS (irritable bowel syndrome)   . COPD (chronic obstructive pulmonary disease)   . Hypertension   . Allergy   . CVA (cerebral infarction)   . Headache   . Fibromyalgia   . Hyperlipidemia   . Anxiety   . Depression   . Diabetes mellitus, type II   . Stroke     HOSPITAL COURSE:   1. Acute respiratory failure- I will check pulse ox now upon discharge. 2. COPD exacerbation - patient feeling much better and would like to go home. We'll give a prednisone taper and continue Levaquin. 3. Acute cystitis without hematuria- Levaquin should cover. 4. Fibromyalgia, anxiety and depression- no changes in psychiatric medication 5. Hyperlipidemia unspecified continue atorvastatin 6. Chronic pain on chronic medication. 7. History of CVA on aspirin.  DISCHARGE CONDITIONS:   Satisfactory  CONSULTS OBTAINED:  None  DRUG ALLERGIES:   Allergies  Allergen Reactions  . Bextra  [Valdecoxib]   . Compazine  [Prochlorperazine Edisylate]   . Lithium Carbonate   . Lyrica [Pregabalin]     DISCHARGE MEDICATIONS:   Current Discharge Medication List    START taking these medications   Details  levofloxacin (LEVAQUIN) 500 MG tablet Take 1 tablet (500 mg total) by mouth daily. Qty: 8 tablet, Refills: 0    predniSONE (DELTASONE) 5 MG tablet Take 1 tablet (5 mg total) by mouth daily with breakfast. Take 5 tabs po day1; 4 tabs day2; 3 tabs day3; 2 tabs  day4; 1 tab day5 then stop Qty: 15 tablet, Refills: 0      CONTINUE these medications which have NOT CHANGED   Details  atorvastatin (LIPITOR) 40 MG tablet Take 1 tablet by mouth daily.    Fluticasone-Salmeterol (ADVAIR DISKUS) 250-50 MCG/DOSE AEPB Inhale 1 puff into the lungs 2 (two) times daily. Qty: 60 each, Refills: 5   Associated Diagnoses: COPD exacerbation    morphine (MSIR) 15 MG tablet Take 15 mg by mouth every 8 (eight) hours as needed. for pain Refills: 0    QUEtiapine (SEROQUEL) 200 MG tablet Take 200 mg by mouth at bedtime.    tiotropium (SPIRIVA HANDIHALER) 18 MCG inhalation capsule Place 1 capsule (18 mcg total) into inhaler and inhale daily. Qty: 30 capsule, Refills: 5   Associated Diagnoses: COPD exacerbation    albuterol (VENTOLIN HFA) 108 (90 BASE) MCG/ACT inhaler Inhale 2 puffs into the lungs every 4 (four) hours as needed.    aspirin 81 MG tablet Take 1 tablet by mouth daily.    clonazePAM (KLONOPIN) 1 MG tablet Take 1 tablet (1 mg total) by mouth 3 (three) times daily. Qty: 90 tablet, Refills: 1    DULoxetine (CYMBALTA) 30 MG capsule Take 1 capsule (30 mg total) by mouth 2 (two) times daily. Qty: 60 capsule, Refills: 2    fluticasone (FLONASE) 50 MCG/ACT nasal spray Place 2 sprays into the nose daily.    metoprolol succinate (TOPROL-XL) 25 MG 24 hr tablet Take 25  mg by mouth daily. Refills: 3    mirtazapine (REMERON) 15 MG tablet Take 1 tablet (15 mg total) by mouth at bedtime. Qty: 30 tablet, Refills: 2    omeprazole (PRILOSEC) 20 MG capsule Take 1 capsule (20 mg total) by mouth daily. Qty: 30 capsule, Refills: 5   Associated Diagnoses: Gastroesophageal reflux disease without esophagitis      STOP taking these medications     fluconazole (DIFLUCAN) 150 MG tablet          DISCHARGE INSTRUCTIONS:   Follow-up with Dr. Ancil Boozer in 1 week  If you experience worsening of your admission symptoms, develop shortness of breath, life threatening  emergency, suicidal or homicidal thoughts you must seek medical attention immediately by calling 911 or calling your MD immediately  if symptoms less severe.  You Must read complete instructions/literature along with all the possible adverse reactions/side effects for all the Medicines you take and that have been prescribed to you. Take any new Medicines after you have completely understood and accept all the possible adverse reactions/side effects.   Please note  You were cared for by a hospitalist during your hospital stay. If you have any questions about your discharge medications or the care you received while you were in the hospital after you are discharged, you can call the unit and asked to speak with the hospitalist on call if the hospitalist that took care of you is not available. Once you are discharged, your primary care physician will handle any further medical issues. Please note that NO REFILLS for any discharge medications will be authorized once you are discharged, as it is imperative that you return to your primary care physician (or establish a relationship with a primary care physician if you do not have one) for your aftercare needs so that they can reassess your need for medications and monitor your lab values.    Today   CHIEF COMPLAINT:   Chief Complaint  Patient presents with  . COPD    HISTORY OF PRESENT ILLNESS:  Beth [name]  is a 69 y.o. female with a known history of COPD presented with shortness of breath and found to be in COPD exacerbation.   VITAL SIGNS:  Blood pressure 106/49, pulse 87, temperature 97.8 F (36.6 C), temperature source Oral, resp. rate 18, height 5\' 5"  (1.651 m), weight 68.448 kg (150 lb 14.4 oz), SpO2 97 %.    PHYSICAL EXAMINATION:  GENERAL:  69 y.o.-year-old patient lying in the bed with no acute distress.  EYES: Pupils equal, round, reactive to light and accommodation. No scleral icterus. Extraocular muscles intact.  HEENT: Head  atraumatic, normocephalic. Oropharynx and nasopharynx clear.  NECK:  Supple, no jugular venous distention. No thyroid enlargement, no tenderness.  LUNGS: Normal breath sounds bilaterally, no wheezing, rales,rhonchi or crepitation. No use of accessory muscles of respiration.  CARDIOVASCULAR: S1, S2 normal. No murmurs, rubs, or gallops.  ABDOMEN: Soft, non-tender, non-distended. Bowel sounds present. No organomegaly or mass.  EXTREMITIES: No pedal edema, cyanosis, or clubbing.  NEUROLOGIC: Cranial nerves II through XII are intact. Muscle strength 5/5 in all extremities. Sensation intact. Gait not checked.  PSYCHIATRIC: The patient is alert and oriented x 3.  SKIN: No obvious rash, lesion, or ulcer.   DATA REVIEW:   CBC  Recent Labs Lab 08/25/14 1031  WBC 10.2  HGB 12.8  HCT 38.5  PLT 160    Chemistries   Recent Labs Lab 08/25/14 1031  NA 138  K 3.3*  CL 95*  CO2 32  GLUCOSE 125*  BUN 8  CREATININE 0.70  CALCIUM 8.8*     Microbiology Results  Results for orders placed or performed during the hospital encounter of 08/25/14  Urine culture     Status: None (Preliminary result)   Collection Time: 08/25/14 10:31 AM  Result Value Ref Range Status   Specimen Description URINE, CLEAN CATCH  Final   Special Requests NONE  Final   Culture TOO YOUNG TO READ  Final   Report Status PENDING  Incomplete    RADIOLOGY:  Dg Chest 2 View  08/25/2014   CLINICAL DATA:  Cough and fever for 2 weeks  EXAM: CHEST  2 VIEW  COMPARISON:  December 20, 2012  FINDINGS: There is bibasilar atelectatic change. Lungs elsewhere clear. Heart size and pulmonary vascularity are normal. No adenopathy. There is postoperative change in the lower cervical region. There is fairly marked anterior wedging of the T12 vertebral body, stable.  IMPRESSION: Bibasilar atelectatic change. No edema or consolidation. Heart size within normal limits.   Electronically Signed   By: Lowella Grip III M.D.   On: 08/25/2014  11:27   Management plans discussed with the patient, and she is in agreement.  CODE STATUS:     Code Status Orders        Start     Ordered   08/25/14 1246  Full code   Continuous     08/25/14 1247      TOTAL TIME TAKING CARE OF THIS PATIENT: 32 minutes greater than 50% of the time spent in counseling and coordination of care   Loletha Grayer M.D on 08/26/2014 at 12:03 PM  Between 7am to 6pm - Pager - [phone]  After 6pm go to [email or web address] - password [email or web address] [facility]  Office  [phone]  CC: Primary care physician; Loistine Chance, MD

## 2014-08-27 LAB — URINE CULTURE

## 2014-09-04 ENCOUNTER — Encounter: Payer: Self-pay | Admitting: Emergency Medicine

## 2014-09-04 ENCOUNTER — Emergency Department
Admission: EM | Admit: 2014-09-04 | Discharge: 2014-09-04 | Disposition: A | Discharge status: Home / Self Care | Payer: PPO | Attending: Student | Admitting: Student

## 2014-09-04 DIAGNOSIS — Z7951 Long term (current) use of inhaled steroids: Secondary | ICD-10-CM | POA: Insufficient documentation

## 2014-09-04 DIAGNOSIS — Z7982 Long term (current) use of aspirin: Secondary | ICD-10-CM | POA: Diagnosis not present

## 2014-09-04 DIAGNOSIS — Z79899 Other long term (current) drug therapy: Secondary | ICD-10-CM | POA: Diagnosis not present

## 2014-09-04 DIAGNOSIS — M791 Myalgia, unspecified site: Secondary | ICD-10-CM

## 2014-09-04 DIAGNOSIS — M2540 Effusion, unspecified joint: Secondary | ICD-10-CM | POA: Diagnosis present

## 2014-09-04 DIAGNOSIS — I10 Essential (primary) hypertension: Secondary | ICD-10-CM | POA: Insufficient documentation

## 2014-09-04 DIAGNOSIS — Z87891 Personal history of nicotine dependence: Secondary | ICD-10-CM | POA: Diagnosis not present

## 2014-09-04 DIAGNOSIS — E119 Type 2 diabetes mellitus without complications: Secondary | ICD-10-CM | POA: Insufficient documentation

## 2014-09-04 DIAGNOSIS — M2550 Pain in unspecified joint: Secondary | ICD-10-CM | POA: Insufficient documentation

## 2014-09-04 DIAGNOSIS — Z792 Long term (current) use of antibiotics: Secondary | ICD-10-CM | POA: Insufficient documentation

## 2014-09-04 LAB — COMPREHENSIVE METABOLIC PANEL
ALK PHOS: 80 U/L (ref 38–126)
ALT: 13 U/L — ABNORMAL LOW (ref 14–54)
AST: 17 U/L (ref 15–41)
Albumin: 2.8 g/dL — ABNORMAL LOW (ref 3.5–5.0)
Anion gap: 6 (ref 5–15)
BUN: 8 mg/dL (ref 6–20)
CO2: 31 mmol/L (ref 22–32)
CREATININE: 0.63 mg/dL (ref 0.44–1.00)
Calcium: 8.4 mg/dL — ABNORMAL LOW (ref 8.9–10.3)
Chloride: 103 mmol/L (ref 101–111)
GFR calc non Af Amer: >60 mL/min (ref >60)
Glucose, Bld: 109 mg/dL — ABNORMAL HIGH (ref 65–99)
Potassium: 3.2 mmol/L — ABNORMAL LOW (ref 3.5–5.1)
SODIUM: 140 mmol/L (ref 135–145)
Total Bilirubin: 0.2 mg/dL — ABNORMAL LOW (ref 0.3–1.2)
Total Protein: 5.9 g/dL — ABNORMAL LOW (ref 6.5–8.1)

## 2014-09-04 LAB — CK: CK TOTAL: 19 U/L — AB (ref 38–234)

## 2014-09-04 MED ORDER — ACETAMINOPHEN 325 MG PO TABS
650.0000 mg | ORAL_TABLET | Freq: Once | ORAL | Status: AC
  Administered 2014-09-04: 650 mg via ORAL
  Filled 2014-09-04: qty 2

## 2014-09-04 MED ORDER — ACETAMINOPHEN 325 MG PO TABS: 650.0000 mg | ORAL_TABLET | Freq: Four times a day (QID) | ORAL | Status: AC | PRN

## 2014-09-04 MED ORDER — ONDANSETRON 4 MG PO TBDP
4.0000 mg | ORAL_TABLET | Freq: Once | ORAL | Status: AC
  Administered 2014-09-04: 4 mg via ORAL
  Filled 2014-09-04: qty 1

## 2014-09-04 NOTE — Discharge Instructions (Signed)
Do not take Levaquin any longer. Follow up with your doctor as soon as possible regarding today's visit. Return immediately to the emergency Department for severe or worsening symptoms, if any of the joints becomes swollen/red/warm to the touch, if you develop fevers greater than 100.4, chest pain, difficulty breathing, vomiting, numbness or weakness, or for any other concerns.

## 2014-09-04 NOTE — ED Provider Notes (Signed)
[facility] Emergency Department Provider Note  ____________________________________________  Time seen: Approximately 7:06 PM  I have reviewed the triage vital signs and the nursing notes.   HISTORY  Chief Complaint Joint Swelling    HPI Beth [name] is a 69 y.o. female with history of diabetes, CVA, COPD who presents for evaluation of 3-4 days diffuse myalgias and arthralgias, gradual onset, constant.  No trauma. Patient was discharged from this hospital on 08/26/2014 with Levaquin and prednisone for COPD exacerbation as well as a urinary tract infection. Her urinary symptoms have resolved as has her shortness of breath however she has developed pain in all of her joints including her fingers, her knees, her shoulders. It is worse with movement. No chest pain, no new difficulty breathing, no rash, no fevers. Current severity of symptoms is moderate. She has not been taking any medications for pain.   Past Medical History  Diagnosis Date  . Vitamin D deficiency   . IBS (irritable bowel syndrome)   . COPD (chronic obstructive pulmonary disease)   . Hypertension   . Allergy   . CVA (cerebral infarction)   . Headache   . Fibromyalgia   . Hyperlipidemia   . Anxiety   . Depression   . Diabetes mellitus, type II   . Stroke     Patient Active Problem List   Diagnosis Date Noted  . COPD exacerbation 08/25/2014  . Chronic LBP 08/09/2014  . Colon polyp 08/09/2014  . COPD, severe 08/09/2014  . CVA, old, hemiparesis 08/09/2014  . Dyslipidemia 08/09/2014  . Dysfunction of eustachian tube 08/09/2014  . Fibromyalgia syndrome 08/09/2014  . Gastro-esophageal reflux disease without esophagitis 08/09/2014  . Benign migrating glossitis 08/09/2014  . Cerebrovascular accident, old 08/09/2014  . IBS (irritable bowel syndrome) 08/09/2014  . Low back pain with radiation 08/09/2014  . Chronic recurrent major depressive disorder 08/09/2014  . Dysmetabolic syndrome  78/29/5621  . OP (osteoporosis) 08/09/2014  . Vitamin D deficiency 08/09/2014  . Tobacco use disorder 08/09/2014  . Gastroesophageal reflux disease without esophagitis 08/09/2014  . Benign hypertension 07/19/2013  . Benign neoplasm of skin of trunk 06/03/2013  . H/O: pneumonia 09/25/2012    Past Surgical History  Procedure Laterality Date  . Breast surgery  2011    biopsy  . Cervical discectomy    . Appendectomy    . Abdominal hysterectomy    . Cholecystectomy    . Sinusotomy      Current Outpatient Rx  Name  Route  Sig  Dispense  Refill  . albuterol (VENTOLIN HFA) 108 (90 BASE) MCG/ACT inhaler   Inhalation   Inhale 2 puffs into the lungs every 4 (four) hours as needed.         Marland Kitchen aspirin 81 MG tablet   Oral   Take 1 tablet by mouth daily.         Marland Kitchen atorvastatin (LIPITOR) 40 MG tablet   Oral   Take 1 tablet by mouth daily.         . clonazePAM (KLONOPIN) 1 MG tablet   Oral   Take 1 tablet (1 mg total) by mouth 3 (three) times daily.   90 tablet   1   . DULoxetine (CYMBALTA) 30 MG capsule   Oral   Take 1 capsule (30 mg total) by mouth 2 (two) times daily.   60 capsule   2   . fluticasone (FLONASE) 50 MCG/ACT nasal spray   Nasal   Place 2 sprays into  the nose daily.         . Fluticasone-Salmeterol (ADVAIR DISKUS) 250-50 MCG/DOSE AEPB   Inhalation   Inhale 1 puff into the lungs 2 (two) times daily.   60 each   5   . levofloxacin (LEVAQUIN) 500 MG tablet   Oral   Take 1 tablet (500 mg total) by mouth daily.   8 tablet   0   . metoprolol succinate (TOPROL-XL) 25 MG 24 hr tablet   Oral   Take 25 mg by mouth daily.      3   . mirtazapine (REMERON) 15 MG tablet   Oral   Take 1 tablet (15 mg total) by mouth at bedtime.   30 tablet   2   . morphine (MSIR) 15 MG tablet   Oral   Take 15 mg by mouth every 8 (eight) hours as needed. for pain      0   . omeprazole (PRILOSEC) 20 MG capsule   Oral   Take 1 capsule (20 mg total) by mouth  daily.   30 capsule   5   . predniSONE (DELTASONE) 5 MG tablet   Oral   Take 1 tablet (5 mg total) by mouth daily with breakfast. Take 5 tabs po day1; 4 tabs day2; 3 tabs day3; 2 tabs day4; 1 tab day5 then stop   15 tablet   0   . QUEtiapine (SEROQUEL) 200 MG tablet   Oral   Take 200 mg by mouth at bedtime.         Marland Kitchen tiotropium (SPIRIVA HANDIHALER) 18 MCG inhalation capsule   Inhalation   Place 1 capsule (18 mcg total) into inhaler and inhale daily.   30 capsule   5     Allergies Bextra ; Compazine ; Lithium carbonate; and Lyrica  Family History  Problem Relation Age of Onset  . Anxiety disorder Mother   . Depression Mother   . Cancer Father   . Gallbladder disease Father   . Alcohol abuse Father   . Depression Father     Social History History  Substance Use Topics  . Smoking status: Former Smoker -- 1.00 packs/day for 35 years    Types: Cigarettes    Quit date: 01/29/2012  . Smokeless tobacco: Never Used  . Alcohol Use: No    Review of Systems Constitutional: No fever/chills Eyes: No visual changes. ENT: No sore throat. Cardiovascular: Denies chest pain. Respiratory: Denies shortness of breath. Gastrointestinal: No abdominal pain.  No nausea, no vomiting.  No diarrhea.  No constipation. Genitourinary: Negative for dysuria. Musculoskeletal: Negative for back pain. Skin: Negative for rash. Neurological: Negative for headaches, focal weakness or numbness.  10-point ROS otherwise negative.  ____________________________________________   PHYSICAL EXAM:  VITAL SIGNS: ED Triage Vitals  Enc Vitals Group     BP --      Pulse --      Resp --      Temp --      Temp src --      SpO2 --      Weight --      Height --      Head Cir --      Peak Flow --      Pain Score 09/04/14 1719 8     Pain Loc --      Pain Edu? --      Excl. in [place]? --     Constitutional: Alert and oriented. Well appearing and in no acute  distress. Eyes: Conjunctivae are  normal. PERRL. EOMI. Head: Atraumatic. Nose: No congestion/rhinnorhea. Mouth/Throat: Mucous membranes are moist.  Oropharynx non-erythematous. Neck: No stridor.   Cardiovascular: Normal rate, regular rhythm. Grossly normal heart sounds.  Good peripheral circulation. Respiratory: Normal respiratory effort.  No retractions. Lungs CTAB. Gastrointestinal: Soft and nontender. No distention. No abdominal bruits. No CVA tenderness. Genitourinary: deferred Musculoskeletal: No lower extremity tenderness nor edema.  No joint effusions. Full range of motion at the knees bilaterally, the elbows, the shoulders without any associated tenderness, erythema or swelling of the joints. Neurologic:  Normal speech and language. No gross focal neurologic deficits are appreciated. No gait instability, normal ambulation. Skin:  Skin is warm, dry and intact. No rash noted. Psychiatric: Mood and affect are normal. Speech and behavior are normal.  ____________________________________________   LABS (all labs ordered are listed, but only abnormal results are displayed)  Labs Reviewed  COMPREHENSIVE METABOLIC PANEL - Abnormal; Notable for the following:    Potassium 3.2 (*)    Glucose, Bld 109 (*)    Calcium 8.4 (*)    Total Protein 5.9 (*)    Albumin 2.8 (*)    ALT 13 (*)    Total Bilirubin 0.2 (*)    All other components within normal limits  CK - Abnormal; Notable for the following:    Total CK 19 (*)    All other components within normal limits   ____________________________________________  EKG  none ____________________________________________  RADIOLOGY  none ____________________________________________   PROCEDURES  Procedure(s) performed: None  Critical Care performed: No  ____________________________________________   INITIAL IMPRESSION / ASSESSMENT AND PLAN / ED COURSE  Pertinent labs & imaging results that were available during my care of the patient were reviewed by me and  considered in my medical decision making (see chart for details).  Beth [name] is a 69 y.o. female with history of diabetes, CVA, COPD who presents for evaluation of 3-4 days diffuse myalgias and arthralgias, gradual onset, constant. On exam, she is very well-appearing and in no acute distress. Vital signs stable, she is afebrile. Her exam is benign and she has full range of motion at all joints at any swelling, erythema, warmth associated with any of the joints.. I discussed with her that she may be developing symptoms of tendinopathy related to Levaquin. She took her last dose today. I encouraged her to not take anymore Levaquin and to report this as a potential allergy whenever she is asked about medication allergies or adverse drug reactions.  Discussed that she needs to follow-up with her primary care doctor as soon as possible. Discussed pain control with Tylenol. She is comfortable with the discharge plan. ____________________________________________   FINAL CLINICAL IMPRESSION(S) / ED DIAGNOSES  Final diagnoses:  Myalgia  Arthralgia      Joanne Gavel, MD 09/04/14 2001

## 2014-09-04 NOTE — ED Notes (Addendum)
Pt reports swelling to all joints since being d/c from hospital last week. Pt reports difficulty moving and walking. Pt started on prednisone and levaquin since hospital admission. Pt reports taking 15mg  of morphine around 1130.

## 2014-09-04 NOTE — ED Notes (Signed)
Spoke with Dr Joni Fears in regards to patients complaints, verbal orders for CMP and CK given.

## 2014-09-05 ENCOUNTER — Telehealth: Payer: Self-pay

## 2014-09-05 NOTE — Telephone Encounter (Signed)
Called patient stating we received information where she had called the call a nurse over the weekend.  She was having joint pain and hard to walk.  I notified patient she would need to be seen.  She states she already had a hospital f/u scheduled

## 2014-09-13 ENCOUNTER — Other Ambulatory Visit: Payer: Self-pay

## 2014-09-13 ENCOUNTER — Emergency Department: Payer: PPO

## 2014-09-13 ENCOUNTER — Emergency Department
Admission: EM | Admit: 2014-09-13 | Discharge: 2014-09-13 | Disposition: A | Discharge status: Home / Self Care | Payer: PPO | Attending: Emergency Medicine | Admitting: Emergency Medicine

## 2014-09-13 ENCOUNTER — Encounter: Payer: Self-pay | Admitting: Emergency Medicine

## 2014-09-13 DIAGNOSIS — Z7982 Long term (current) use of aspirin: Secondary | ICD-10-CM | POA: Diagnosis not present

## 2014-09-13 DIAGNOSIS — F419 Anxiety disorder, unspecified: Secondary | ICD-10-CM

## 2014-09-13 DIAGNOSIS — I10 Essential (primary) hypertension: Secondary | ICD-10-CM | POA: Insufficient documentation

## 2014-09-13 DIAGNOSIS — E119 Type 2 diabetes mellitus without complications: Secondary | ICD-10-CM | POA: Insufficient documentation

## 2014-09-13 DIAGNOSIS — Z87891 Personal history of nicotine dependence: Secondary | ICD-10-CM | POA: Diagnosis not present

## 2014-09-13 DIAGNOSIS — Z7951 Long term (current) use of inhaled steroids: Secondary | ICD-10-CM | POA: Insufficient documentation

## 2014-09-13 DIAGNOSIS — Z79899 Other long term (current) drug therapy: Secondary | ICD-10-CM | POA: Insufficient documentation

## 2014-09-13 DIAGNOSIS — M2550 Pain in unspecified joint: Secondary | ICD-10-CM

## 2014-09-13 DIAGNOSIS — M7989 Other specified soft tissue disorders: Secondary | ICD-10-CM | POA: Diagnosis present

## 2014-09-13 LAB — CBC
HEMATOCRIT: 39.1 % (ref 35.0–47.0)
Hemoglobin: 12.8 g/dL (ref 12.0–16.0)
MCH: 30 pg (ref 26.0–34.0)
MCHC: 32.8 g/dL (ref 32.0–36.0)
MCV: 91.5 fL (ref 80.0–100.0)
Platelets: 262 10*3/uL (ref 150–440)
RBC: 4.27 MIL/uL (ref 3.80–5.20)
RDW: 15.1 % — ABNORMAL HIGH (ref 11.5–14.5)
WBC: 5.9 10*3/uL (ref 3.6–11.0)

## 2014-09-13 LAB — BASIC METABOLIC PANEL
Anion gap: 9 (ref 5–15)
BUN: 6 mg/dL (ref 6–20)
CHLORIDE: 103 mmol/L (ref 101–111)
CO2: 30 mmol/L (ref 22–32)
CREATININE: 0.8 mg/dL (ref 0.44–1.00)
Calcium: 9 mg/dL (ref 8.9–10.3)
GFR calc non Af Amer: >60 mL/min (ref >60)
Glucose, Bld: 104 mg/dL — ABNORMAL HIGH (ref 65–99)
POTASSIUM: 3.1 mmol/L — AB (ref 3.5–5.1)
SODIUM: 142 mmol/L (ref 135–145)

## 2014-09-13 LAB — TROPONIN I: Troponin I: <0.03 ng/mL (ref <0.031)

## 2014-09-13 MED ORDER — PREDNISONE 50 MG PO TABS: 50.0000 mg | ORAL_TABLET | Freq: Every day | ORAL | Status: AC

## 2014-09-13 MED ORDER — CLONAZEPAM 1 MG PO TABS: 1.0000 mg | ORAL_TABLET | Freq: Three times a day (TID) | ORAL | Status: DC

## 2014-09-13 NOTE — ED Provider Notes (Signed)
[facility] Emergency Department Provider Note  ____________________________________________  Time seen: 12 pm  I have reviewed the triage vital signs and the nursing notes.   HISTORY  Chief Complaint Leg Swelling and Shaking    HPI Beth [name] is a 69 y.o. female who p/w complaints of joint aching x 5 days. She attributes this to recent antibiotic usage. She denies fever to me. No SOB. No CP. No headache. She also complains of feeling jittery which is because she has not taken her klonopin in several days. She expresses anxiety. No injury to her joints. She also thinks it could be her fibromyalgia acting up     Past Medical History  Diagnosis Date  . Vitamin D deficiency   . IBS (irritable bowel syndrome)   . COPD (chronic obstructive pulmonary disease)   . Hypertension   . Allergy   . CVA (cerebral infarction)   . Headache   . Fibromyalgia   . Hyperlipidemia   . Anxiety   . Depression   . Diabetes mellitus, type II   . Stroke     Patient Active Problem List   Diagnosis Date Noted  . COPD exacerbation 08/25/2014  . Chronic LBP 08/09/2014  . Colon polyp 08/09/2014  . COPD, severe 08/09/2014  . CVA, old, hemiparesis 08/09/2014  . Dyslipidemia 08/09/2014  . Dysfunction of eustachian tube 08/09/2014  . Fibromyalgia syndrome 08/09/2014  . Gastro-esophageal reflux disease without esophagitis 08/09/2014  . Benign migrating glossitis 08/09/2014  . Cerebrovascular accident, old 08/09/2014  . IBS (irritable bowel syndrome) 08/09/2014  . Low back pain with radiation 08/09/2014  . Chronic recurrent major depressive disorder 08/09/2014  . Dysmetabolic syndrome 48/54/6270  . OP (osteoporosis) 08/09/2014  . Vitamin D deficiency 08/09/2014  . Tobacco use disorder 08/09/2014  . Gastroesophageal reflux disease without esophagitis 08/09/2014  . Benign hypertension 07/19/2013  . Benign neoplasm of skin of trunk 06/03/2013  . H/O: pneumonia  09/25/2012    Past Surgical History  Procedure Laterality Date  . Breast surgery  2011    biopsy  . Cervical discectomy    . Appendectomy    . Abdominal hysterectomy    . Cholecystectomy    . Sinusotomy      Current Outpatient Rx  Name  Route  Sig  Dispense  Refill  . acetaminophen (TYLENOL) 325 MG tablet   Oral   Take 2 tablets (650 mg total) by mouth every 6 (six) hours as needed for moderate pain.   15 tablet   0   . albuterol (VENTOLIN HFA) 108 (90 BASE) MCG/ACT inhaler   Inhalation   Inhale 2 puffs into the lungs every 4 (four) hours as needed.         Marland Kitchen aspirin 81 MG tablet   Oral   Take 1 tablet by mouth daily.         Marland Kitchen atorvastatin (LIPITOR) 40 MG tablet   Oral   Take 1 tablet by mouth daily.         . clonazePAM (KLONOPIN) 1 MG tablet   Oral   Take 1 tablet (1 mg total) by mouth 3 (three) times daily.   90 tablet   1   . DULoxetine (CYMBALTA) 30 MG capsule   Oral   Take 1 capsule (30 mg total) by mouth 2 (two) times daily.   60 capsule   2   . fluticasone (FLONASE) 50 MCG/ACT nasal spray   Nasal   Place 2 sprays into the  nose daily.         . Fluticasone-Salmeterol (ADVAIR DISKUS) 250-50 MCG/DOSE AEPB   Inhalation   Inhale 1 puff into the lungs 2 (two) times daily.   60 each   5   . levofloxacin (LEVAQUIN) 500 MG tablet   Oral   Take 1 tablet (500 mg total) by mouth daily.   8 tablet   0   . metoprolol succinate (TOPROL-XL) 25 MG 24 hr tablet   Oral   Take 25 mg by mouth daily.      3   . mirtazapine (REMERON) 15 MG tablet   Oral   Take 1 tablet (15 mg total) by mouth at bedtime.   30 tablet   2   . morphine (MSIR) 15 MG tablet   Oral   Take 15 mg by mouth every 8 (eight) hours as needed. for pain      0   . omeprazole (PRILOSEC) 20 MG capsule   Oral   Take 1 capsule (20 mg total) by mouth daily.   30 capsule   5   . predniSONE (DELTASONE) 5 MG tablet   Oral   Take 1 tablet (5 mg total) by mouth daily with  breakfast. Take 5 tabs po day1; 4 tabs day2; 3 tabs day3; 2 tabs day4; 1 tab day5 then stop   15 tablet   0   . QUEtiapine (SEROQUEL) 200 MG tablet   Oral   Take 200 mg by mouth at bedtime.         Marland Kitchen tiotropium (SPIRIVA HANDIHALER) 18 MCG inhalation capsule   Inhalation   Place 1 capsule (18 mcg total) into inhaler and inhale daily.   30 capsule   5     Allergies Bextra ; Compazine ; Lithium carbonate; and Lyrica  Family History  Problem Relation Age of Onset  . Anxiety disorder Mother   . Depression Mother   . Cancer Father   . Gallbladder disease Father   . Alcohol abuse Father   . Depression Father     Social History Social History  Substance Use Topics  . Smoking status: Former Smoker -- 1.00 packs/day for 35 years    Types: Cigarettes    Quit date: 01/29/2012  . Smokeless tobacco: Never Used  . Alcohol Use: No    Review of Systems  Constitutional: Negative for fever. Eyes: Negative for visual changes. ENT: Negative for sore throat Cardiovascular: Negative for chest pain. Respiratory: Negative for shortness of breath. Gastrointestinal: Negative for abdominal pain, vomiting and diarrhea. Genitourinary: Negative for dysuria. Musculoskeletal: Negative for back pain. +for joint pain Skin: Negative for rash. Neurological: Negative for headaches or focal weakness Psychiatric:+anxiety    ____________________________________________   PHYSICAL EXAM:  VITAL SIGNS: ED Triage Vitals  Enc Vitals Group     BP 09/13/14 1024 131/104 mmHg     Pulse Rate 09/13/14 1024 96     Resp 09/13/14 1024 20     Temp 09/13/14 1024 98.5 F (36.9 C)     Temp Source 09/13/14 1024 Oral     SpO2 09/13/14 1024 100 %     Weight 09/13/14 1024 145 lb (65.772 kg)     Height 09/13/14 1024 5\' 4"  (1.626 m)     Head Cir --      Peak Flow --      Pain Score 09/13/14 1025 7     Pain Loc --      Pain Edu? --  Excl. in [place]? --      Constitutional: Alert and oriented. Well  appearing and in no distress. Eyes: Conjunctivae are normal.  ENT   Head: Normocephalic and atraumatic.   Mouth/Throat: Mucous membranes are moist. Cardiovascular: Normal rate, regular rhythm. Normal and symmetric distal pulses are present in all extremities. No murmurs, rubs, or gallops. Respiratory: Normal respiratory effort without tachypnea nor retractions. Breath sounds are clear and equal bilaterally.  Gastrointestinal: Soft and non-tender in all quadrants. No distention. There is no CVA tenderness. Genitourinary: deferred Musculoskeletal: Nontender with normal range of motion in all extremities. No lower extremity tenderness nor edema. Joints are not swollen. No erythema. No ttp. No pain with rom. 2+ extremity pulses Neurologic:  Normal speech and language. No gross focal neurologic deficits are appreciated. Skin:  Skin is warm, dry and intact. No rash noted. Psychiatric: Mood and affect are normal. Patient exhibits appropriate insight and judgment.  ____________________________________________    LABS (pertinent positives/negatives) No labs ____________________________________________   EKG  ED ECG REPORT   Date: 09/13/2014  EKG Time: 12:21 PM  Rate: 102  Rhythm: sinus tachycardia,   Axis: normal  Intervals:none  ST&T Change: none  Narrative Interpretation: unremarkable             ____________________________________________    RADIOLOGY I have personally reviewed any xrays that were ordered on this patient: CXR is unremarkable  ____________________________________________   PROCEDURES  Procedure(s) performed: none  Critical Care performed: none  ____________________________________________   INITIAL IMPRESSION / ASSESSMENT AND PLAN / ED COURSE  Pertinent labs & imaging results that were available during my care of the patient were reviewed by me and considered in my medical decision making (see chart for details).  Patient with benign  exam. CXR clear. No tachypnea or difficulty breathing. She complains of arthralgia. Her joints appear normal. No evidence of infection. We will treat with brief course of systemic steroids. Will provide 2 days of klonopin for her severe anxiety.   ____________________________________________   FINAL CLINICAL IMPRESSION(S) / ED DIAGNOSES  Final diagnoses:  Arthralgia  Anxiety     Lavonia Drafts, MD 09/13/14 1346

## 2014-09-13 NOTE — Discharge Instructions (Signed)

## 2014-09-13 NOTE — ED Notes (Signed)
Pt to ed with c/o swelling in feet and ankles x 2 weeks.  Pt also reports sob and shaking and jerking periodically for the past 2 weeks.  Pt states unable to see pmd x 3 weeks.

## 2014-09-20 ENCOUNTER — Ambulatory Visit: Payer: Self-pay | Admitting: Family Medicine

## 2014-09-21 ENCOUNTER — Other Ambulatory Visit: Payer: Self-pay | Admitting: Family Medicine

## 2014-09-25 ENCOUNTER — Other Ambulatory Visit: Payer: Self-pay | Admitting: Family Medicine

## 2014-09-27 ENCOUNTER — Ambulatory Visit (INDEPENDENT_AMBULATORY_CARE_PROVIDER_SITE_OTHER): Payer: PPO | Admitting: Family Medicine

## 2014-09-27 ENCOUNTER — Encounter: Payer: Self-pay | Admitting: Family Medicine

## 2014-09-27 VITALS — BP 122/78 | HR 97 | Temp 97.8°F | Resp 18 | Ht 64.0 in | Wt 138.9 lb

## 2014-09-27 DIAGNOSIS — Z23 Encounter for immunization: Secondary | ICD-10-CM | POA: Diagnosis not present

## 2014-09-27 DIAGNOSIS — Z09 Encounter for follow-up examination after completed treatment for conditions other than malignant neoplasm: Secondary | ICD-10-CM | POA: Diagnosis not present

## 2014-09-27 DIAGNOSIS — R0902 Hypoxemia: Secondary | ICD-10-CM | POA: Diagnosis not present

## 2014-09-27 DIAGNOSIS — J449 Chronic obstructive pulmonary disease, unspecified: Secondary | ICD-10-CM | POA: Diagnosis not present

## 2014-09-27 NOTE — Progress Notes (Signed)
Name: Beth [name]   MRN: [id number]    DOB: [birth year]   Date:09/27/2014       Progress Note  Subjective  Chief Complaint  Chief Complaint  Patient presents with  . Transition into Care    Went to [facility] for UTI and was sent over to the ER. Patient is having SOB and low oxygen-highest O2 today was 90.     HPI  COPD severe: with hypoxemia, she had two exacerbations this year already needing prednisone orally. She is not on home oxygen, she has quit smoking.  She was admitted at the end of July, she is now back to baseline, she has a mild cough in am that is productive, sputum is usually yellow in color, no fever. SOB with activity. She has orthopnea ( two pillows )  Chronic pain: she sees the pain clinic, having generalized body aches and has some jerks during exam, also has some slurred speech and closes her eyes frequently, like if she will fall asleep.  Patient Active Problem List   Diagnosis Date Noted  . Hypoxemia 09/27/2014  . Chronic LBP 08/09/2014  . Colon polyp 08/09/2014  . COPD, severe 08/09/2014  . CVA, old, hemiparesis 08/09/2014  . Dyslipidemia 08/09/2014  . Dysfunction of eustachian tube 08/09/2014  . Fibromyalgia syndrome 08/09/2014  . Gastro-esophageal reflux disease without esophagitis 08/09/2014  . Benign migrating glossitis 08/09/2014  . Cerebrovascular accident, old 08/09/2014  . IBS (irritable bowel syndrome) 08/09/2014  . Low back pain with radiation 08/09/2014  . Chronic recurrent major depressive disorder 08/09/2014  . Dysmetabolic syndrome 32/44/0102  . OP (osteoporosis) 08/09/2014  . Vitamin D deficiency 08/09/2014  . Gastroesophageal reflux disease without esophagitis 08/09/2014  . Benign hypertension 07/19/2013  . Benign neoplasm of skin of trunk 06/03/2013  . H/O: pneumonia 09/25/2012    Past Surgical History  Procedure Laterality Date  . Breast surgery  2011    biopsy  . Cervical discectomy    . Appendectomy    . Abdominal  hysterectomy    . Cholecystectomy    . Sinusotomy      Family History  Problem Relation Age of Onset  . Anxiety disorder Mother   . Depression Mother   . Cancer Father   . Gallbladder disease Father   . Alcohol abuse Father   . Depression Father     Social History   Social History  . Marital Status: Single    Spouse Name: N/A  . Number of Children: N/A  . Years of Education: N/A   Occupational History  . Not on file.   Social History Main Topics  . Smoking status: Former Smoker -- 1.00 packs/day for 35 years    Types: Cigarettes    Quit date: 01/29/2012  . Smokeless tobacco: Never Used  . Alcohol Use: No  . Drug Use: No  . Sexual Activity: No   Other Topics Concern  . Not on file   Social History Narrative     Current outpatient prescriptions:  .  acetaminophen (TYLENOL) 325 MG tablet, Take 2 tablets (650 mg total) by mouth every 6 (six) hours as needed for moderate pain., Disp: 15 tablet, Rfl: 0 .  albuterol (VENTOLIN HFA) 108 (90 BASE) MCG/ACT inhaler, Inhale 2 puffs into the lungs every 4 (four) hours as needed., Disp: , Rfl:  .  aspirin 81 MG tablet, Take 1 tablet by mouth daily., Disp: , Rfl:  .  atorvastatin (LIPITOR) 40 MG tablet, Take 1 tablet  by mouth daily., Disp: , Rfl:  .  clonazePAM (KLONOPIN) 1 MG tablet, Take 1 tablet (1 mg total) by mouth 3 (three) times daily., Disp: 90 tablet, Rfl: 1 .  DULoxetine (CYMBALTA) 60 MG capsule, Take 60 mg by mouth 2 (two) times daily., Disp: , Rfl: 1 .  Fluticasone-Salmeterol (ADVAIR DISKUS) 250-50 MCG/DOSE AEPB, Inhale 1 puff into the lungs 2 (two) times daily., Disp: 60 each, Rfl: 5 .  metoprolol succinate (TOPROL-XL) 25 MG 24 hr tablet, TAKE 1 TABLET BY MOUTH DAILY, Disp: 30 tablet, Rfl: 3 .  mirtazapine (REMERON) 15 MG tablet, Take 1 tablet (15 mg total) by mouth at bedtime., Disp: 30 tablet, Rfl: 2 .  morphine (MSIR) 15 MG tablet, Take 15 mg by mouth every 8 (eight) hours as needed. for pain, Disp: , Rfl: 0 .   omeprazole (PRILOSEC) 20 MG capsule, Take 1 capsule (20 mg total) by mouth daily., Disp: 30 capsule, Rfl: 5 .  QUEtiapine (SEROQUEL) 200 MG tablet, Take 200 mg by mouth at bedtime., Disp: , Rfl:  .  tiotropium (SPIRIVA HANDIHALER) 18 MCG inhalation capsule, Place 1 capsule (18 mcg total) into inhaler and inhale daily., Disp: 30 capsule, Rfl: 5  Allergies  Allergen Reactions  . Bextra  [Valdecoxib]   . Compazine  [Prochlorperazine Edisylate]   . Lithium Carbonate   . Lyrica [Pregabalin]      ROS  Constitutional: Negative for fever or weight change.  Respiratory: Positive for cough and shortness of breath.   Cardiovascular: Negative for chest pain or palpitations.  Gastrointestinal: Negative for abdominal pain, no bowel changes.  Musculoskeletal: Positive  for gait problem no joint swelling, chronic back pain  Skin: Negative for rash.  Neurological: Negative for dizziness or headache.  No other specific complaints in a complete review of systems (except as listed in HPI above).  Objective  Filed Vitals:   09/27/14 1013  BP: 122/78  Pulse: 97  Temp: 97.8 F (36.6 C)  TempSrc: Oral  Resp: 18  Height: 5' 4" (1.626 m)  Weight: 138 lb 14.4 oz (63.005 kg)  SpO2: 90%    Body mass index is 23.83 kg/(m^2).  Physical Exam  Constitutional: Patient appears sleepy, obese.  No distress. Sitting at the table with some involuntary jerks.  HEENT: head atraumatic, normocephalic, pupils equal and reactive to light,  neck supple, throat within normal limits Cardiovascular: Normal rate, regular rhythm and normal heart sounds.  No murmur heard. No BLE edema. Pulmonary/Chest: Effort normal and breath sounds normal. No respiratory distress. Abdominal: Soft.  There is no tenderness. Psychiatric: Patient has a normal mood, flat affect. Appeared confused at times.  Neuro: normal reflexes, some jerks  Recent Results (from the past 2160 hour(s))  Basic metabolic panel     Status: Abnormal    Collection Time: 08/25/14 10:31 AM  Result Value Ref Range   Sodium 138 135 - 145 mmol/L   Potassium 3.3 (L) 3.5 - 5.1 mmol/L   Chloride 95 (L) 101 - 111 mmol/L   CO2 32 22 - 32 mmol/L   Glucose, Bld 125 (H) 65 - 99 mg/dL   BUN 8 6 - 20 mg/dL   Creatinine, Ser 0.70 0.44 - 1.00 mg/dL   Calcium 8.8 (L) 8.9 - 10.3 mg/dL   GFR calc non Af Amer >60 >60 mL/min   GFR calc Af Amer >60 >60 mL/min    Comment: (NOTE) The eGFR has been calculated using the CKD EPI equation. This calculation has not been validated in  all clinical situations. eGFR's persistently <60 mL/min signify possible Chronic Kidney Disease.    Anion gap 11 5 - 15  CBC     Status: None   Collection Time: 08/25/14 10:31 AM  Result Value Ref Range   WBC 10.2 3.6 - 11.0 K/uL   RBC 4.25 3.80 - 5.20 MIL/uL   Hemoglobin 12.8 12.0 - 16.0 g/dL   HCT 38.5 35.0 - 47.0 %   MCV 90.5 80.0 - 100.0 fL   MCH 30.2 26.0 - 34.0 pg   MCHC 33.4 32.0 - 36.0 g/dL   RDW 14.2 11.5 - 14.5 %   Platelets 160 150 - 440 K/uL  Urinalysis complete, with microscopic     Status: Abnormal   Collection Time: 08/25/14 10:31 AM  Result Value Ref Range   Color, Urine YELLOW (A) YELLOW   APPearance HAZY (A) CLEAR   Glucose, UA NEGATIVE NEGATIVE mg/dL   Bilirubin Urine NEGATIVE NEGATIVE   Ketones, ur NEGATIVE NEGATIVE mg/dL   Specific Gravity, Urine 1.010 1.005 - 1.030   Hgb urine dipstick NEGATIVE NEGATIVE   pH 6.0 5.0 - 8.0   Protein, ur NEGATIVE NEGATIVE mg/dL   Nitrite NEGATIVE NEGATIVE   Leukocytes, UA 3+ (A) NEGATIVE   RBC / HPF 0-5 0 - 5 RBC/hpf   WBC, UA TOO NUMEROUS TO COUNT 0 - 5 WBC/hpf   Bacteria, UA NONE SEEN NONE SEEN   Squamous Epithelial / LPF 6-30 (A) NONE SEEN   Mucous PRESENT   Urine culture     Status: None   Collection Time: 08/25/14 10:31 AM  Result Value Ref Range   Specimen Description URINE, CLEAN CATCH    Special Requests NONE    Culture MULTIPLE SPECIES PRESENT, SUGGEST RECOLLECTION    Report Status 08/27/2014 FINAL    Glucose, capillary     Status: Abnormal   Collection Time: 08/25/14  5:03 PM  Result Value Ref Range   Glucose-Capillary 200 (H) 65 - 99 mg/dL   Comment 1 Notify RN   Glucose, capillary     Status: Abnormal   Collection Time: 08/25/14 10:05 PM  Result Value Ref Range   Glucose-Capillary 205 (H) 65 - 99 mg/dL  Glucose, capillary     Status: Abnormal   Collection Time: 08/26/14  7:13 AM  Result Value Ref Range   Glucose-Capillary 124 (H) 65 - 99 mg/dL   Comment 1 Notify RN   Glucose, capillary     Status: Abnormal   Collection Time: 08/26/14 10:56 AM  Result Value Ref Range   Glucose-Capillary 160 (H) 65 - 99 mg/dL   Comment 1 Notify RN   Comprehensive metabolic panel     Status: Abnormal   Collection Time: 09/04/14  5:31 PM  Result Value Ref Range   Sodium 140 135 - 145 mmol/L   Potassium 3.2 (L) 3.5 - 5.1 mmol/L   Chloride 103 101 - 111 mmol/L   CO2 31 22 - 32 mmol/L   Glucose, Bld 109 (H) 65 - 99 mg/dL   BUN 8 6 - 20 mg/dL   Creatinine, Ser 0.63 0.44 - 1.00 mg/dL   Calcium 8.4 (L) 8.9 - 10.3 mg/dL   Total Protein 5.9 (L) 6.5 - 8.1 g/dL   Albumin 2.8 (L) 3.5 - 5.0 g/dL   AST 17 15 - 41 U/L   ALT 13 (L) 14 - 54 U/L   Alkaline Phosphatase 80 38 - 126 U/L   Total Bilirubin 0.2 (L) 0.3 - 1.2 mg/dL  GFR calc non Af Amer >60 >60 mL/min   GFR calc Af Amer >60 >60 mL/min    Comment: (NOTE) The eGFR has been calculated using the CKD EPI equation. This calculation has not been validated in all clinical situations. eGFR's persistently <60 mL/min signify possible Chronic Kidney Disease.    Anion gap 6 5 - 15  CK     Status: Abnormal   Collection Time: 09/04/14  5:31 PM  Result Value Ref Range   Total CK 19 (L) 38 - 234 U/L  Basic metabolic panel     Status: Abnormal   Collection Time: 09/13/14 10:38 AM  Result Value Ref Range   Sodium 142 135 - 145 mmol/L   Potassium 3.1 (L) 3.5 - 5.1 mmol/L   Chloride 103 101 - 111 mmol/L   CO2 30 22 - 32 mmol/L   Glucose, Bld 104 (H)  65 - 99 mg/dL   BUN 6 6 - 20 mg/dL   Creatinine, Ser 0.80 0.44 - 1.00 mg/dL   Calcium 9.0 8.9 - 10.3 mg/dL   GFR calc non Af Amer >60 >60 mL/min   GFR calc Af Amer >60 >60 mL/min    Comment: (NOTE) The eGFR has been calculated using the CKD EPI equation. This calculation has not been validated in all clinical situations. eGFR's persistently <60 mL/min signify possible Chronic Kidney Disease.    Anion gap 9 5 - 15  CBC     Status: Abnormal   Collection Time: 09/13/14 10:38 AM  Result Value Ref Range   WBC 5.9 3.6 - 11.0 K/uL   RBC 4.27 3.80 - 5.20 MIL/uL   Hemoglobin 12.8 12.0 - 16.0 g/dL   HCT 39.1 35.0 - 47.0 %   MCV 91.5 80.0 - 100.0 fL   MCH 30.0 26.0 - 34.0 pg   MCHC 32.8 32.0 - 36.0 g/dL   RDW 15.1 (H) 11.5 - 14.5 %   Platelets 262 150 - 440 K/uL  Troponin I     Status: None   Collection Time: 09/13/14 10:38 AM  Result Value Ref Range   Troponin I <0.03 <0.031 ng/mL    Comment:        NO INDICATION OF MYOCARDIAL INJURY.      PHQ2/9: Depression screen PHQ 2/9 08/09/2014  Decreased Interest 0  Down, Depressed, Hopeless 1  PHQ - 2 Score 1     Fall Risk: Fall Risk  08/09/2014 08/09/2014  Falls in the past year? Yes Yes  Number falls in past yr: 2 or more 2 or more  Injury with Fall? No No     Assessment & Plan  1. COPD, severe Explained importance of seeing Pulmonologist, compliance with inhalers and may need home oxygen.  - Pneumococcal conjugate vaccine 13-valent IM - Ambulatory referral to Pulmonology  2. Need for vaccination for H flu type B  - Flu vaccine HIGH DOSE PF (Fluzone High dose)  3. Hypoxemia From COPD  4. Hospital discharge follow-up Urine culture negative, contaminant. COPD exacerbation   5. Need for pneumococcal vaccination - Pneumococcal conjugate vaccine 13-valent IM

## 2014-10-24 ENCOUNTER — Other Ambulatory Visit: Payer: Self-pay | Admitting: Family Medicine

## 2014-10-31 ENCOUNTER — Encounter: Payer: Self-pay | Admitting: Family Medicine

## 2014-10-31 ENCOUNTER — Ambulatory Visit (INDEPENDENT_AMBULATORY_CARE_PROVIDER_SITE_OTHER): Payer: PPO | Admitting: Family Medicine

## 2014-10-31 VITALS — BP 106/62 | HR 72 | Temp 98.3°F | Resp 16 | Ht 64.0 in | Wt 148.9 lb

## 2014-10-31 DIAGNOSIS — G43009 Migraine without aura, not intractable, without status migrainosus: Secondary | ICD-10-CM | POA: Diagnosis not present

## 2014-10-31 DIAGNOSIS — J449 Chronic obstructive pulmonary disease, unspecified: Secondary | ICD-10-CM

## 2014-10-31 DIAGNOSIS — E785 Hyperlipidemia, unspecified: Secondary | ICD-10-CM

## 2014-10-31 DIAGNOSIS — I10 Essential (primary) hypertension: Secondary | ICD-10-CM

## 2014-10-31 DIAGNOSIS — K219 Gastro-esophageal reflux disease without esophagitis: Secondary | ICD-10-CM

## 2014-10-31 DIAGNOSIS — Z1239 Encounter for other screening for malignant neoplasm of breast: Secondary | ICD-10-CM

## 2014-10-31 DIAGNOSIS — E876 Hypokalemia: Secondary | ICD-10-CM | POA: Diagnosis not present

## 2014-10-31 DIAGNOSIS — F339 Major depressive disorder, recurrent, unspecified: Secondary | ICD-10-CM

## 2014-10-31 DIAGNOSIS — Z630 Problems in relationship with spouse or partner: Secondary | ICD-10-CM

## 2014-10-31 DIAGNOSIS — I69359 Hemiplegia and hemiparesis following cerebral infarction affecting unspecified side: Secondary | ICD-10-CM

## 2014-10-31 MED ORDER — ATORVASTATIN CALCIUM 40 MG PO TABS: 40.0000 mg | ORAL_TABLET | Freq: Every day | ORAL | Status: DC

## 2014-10-31 MED ORDER — METOPROLOL SUCCINATE ER 25 MG PO TB24: 12.5000 mg | ORAL_TABLET | Freq: Every day | ORAL | Status: DC

## 2014-10-31 MED ORDER — METOPROLOL SUCCINATE ER 25 MG PO TB24: 25.0000 mg | ORAL_TABLET | Freq: Every day | ORAL | Status: DC
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
## 2014-10-31 NOTE — Addendum Note (Signed)
Addended by: Steele Sizer F on: 10/31/2014 06:08 PM   Modules accepted: Miquel Dunn

## 2014-11-21 ENCOUNTER — Other Ambulatory Visit: Payer: Self-pay

## 2014-11-21 MED ORDER — CLONAZEPAM 1 MG PO TABS: 1.0000 mg | ORAL_TABLET | Freq: Three times a day (TID) | ORAL | Status: DC

## 2014-11-21 MED ORDER — MIRTAZAPINE 15 MG PO TABS: 15.0000 mg | ORAL_TABLET | Freq: Every day | ORAL | Status: DC

## 2014-11-21 NOTE — Telephone Encounter (Signed)
received a fax requesting a refill on mirtazapine 15mg  take 1 tablet by mouth at bedtime

## 2014-11-21 NOTE — Telephone Encounter (Signed)
fax- confirmed rx for klonopin 1mg  rx id # [id number]  order # [id number]

## 2014-11-21 NOTE — Telephone Encounter (Signed)
pt left message that she needs her clonazepam refilled.  Pt was last seen on 08-18-14 next appt 12-06-14

## 2014-12-06 ENCOUNTER — Encounter: Payer: Self-pay | Admitting: Psychiatry

## 2014-12-06 ENCOUNTER — Ambulatory Visit (INDEPENDENT_AMBULATORY_CARE_PROVIDER_SITE_OTHER): Payer: PPO | Admitting: Psychiatry

## 2014-12-06 VITALS — BP 124/80 | HR 82 | Temp 98.2°F | Ht 65.5 in | Wt 156.2 lb

## 2014-12-06 DIAGNOSIS — F411 Generalized anxiety disorder: Secondary | ICD-10-CM

## 2014-12-06 MED ORDER — CLONAZEPAM 1 MG PO TABS: 1.0000 mg | ORAL_TABLET | Freq: Three times a day (TID) | ORAL | Status: DC

## 2014-12-06 MED ORDER — DULOXETINE HCL 60 MG PO CPEP: 60.0000 mg | ORAL_CAPSULE | Freq: Two times a day (BID) | ORAL | Status: DC

## 2014-12-06 MED ORDER — MIRTAZAPINE 15 MG PO TABS: 15.0000 mg | ORAL_TABLET | Freq: Every day | ORAL | Status: DC

## 2014-12-06 NOTE — Progress Notes (Signed)
[facility] MD/PA/NP OP Progress Note  12/06/2014 4:45 PM Beth [name]  MRN:  [id number]   Subjective:  She returns for follow-up of her generalized anxiety disorder and major depressive disorder. She relates that things have largely been the same, however she does state that she has been experiencing some depression. She seems to cite some social stressors that may be contributing to her depression such as that her daughter is not talking to her and then her grand daughter is also not speaking with her. She stated that the holidays can be tough because due to the strained relationship with her daughter the patient and her second husband are not invited to holiday events.  I did discuss with the patient that perhaps they might want to develop their own festivities and their own social circle. Patient cites financial issues and limitations in their ability to do these things. I did discuss with her perhaps trying to engage in therapy as she seems to continue to discuss issues with relationship stress from finances. Patient reflects she probably does need to do this but she not going to do so at this time. Chief Complaint: Some depression Chief Complaint    Follow-up; Medication Refill     Visit Diagnosis:     ICD-9-CM ICD-10-CM   1. GAD (generalized anxiety disorder) 300.02 F41.1     Past Medical History:  Past Medical History  Diagnosis Date  . Vitamin D deficiency   . IBS (irritable bowel syndrome)   . COPD (chronic obstructive pulmonary disease) ([place])   . Hypertension   . Allergy   . CVA (cerebral infarction)   . Headache   . Fibromyalgia   . Hyperlipidemia   . Anxiety   . Depression   . Stroke [facility])     Past Surgical History  Procedure Laterality Date  . Breast surgery  2011    biopsy  . Cervical discectomy    . Appendectomy    . Abdominal hysterectomy    . Cholecystectomy    . Sinusotomy     Family History:  Family History  Problem Relation Age of Onset  . Anxiety  disorder Mother   . Depression Mother   . Cancer Father   . Gallbladder disease Father   . Alcohol abuse Father   . Depression Father    Social History:  Social History   Social History  . Marital Status: Single    Spouse Name: N/A  . Number of Children: N/A  . Years of Education: N/A   Social History Main Topics  . Smoking status: Former Smoker -- 1.00 packs/day for 35 years    Types: Cigarettes    Quit date: 01/29/2012  . Smokeless tobacco: Never Used  . Alcohol Use: No  . Drug Use: No  . Sexual Activity: No   Other Topics Concern  . None   Social History Narrative   Additional History:   Assessment:   Musculoskeletal: Strength & Muscle Tone: within normal limits Gait & Station: Slow Patient leans: N/A  Psychiatric Specialty Exam: HPI  Review of Systems  Psychiatric/Behavioral: Positive for depression (he continues to have depression but does state the medications do help.). Negative for suicidal ideas, hallucinations, memory loss and substance abuse. The patient is nervous/anxious and has insomnia (Stated the mirtazapine did help her with insomnia.).     Blood pressure 124/80, pulse 82, temperature 98.2 F (36.8 C), temperature source Tympanic, height 5' 5.5" (1.664 m), weight 156 lb 3.2 oz (70.852 kg), SpO2 92 %.  Body mass index is 25.59 kg/(m^2).  General Appearance: Well Groomed  Eye Contact:  Good  Speech:  slow  Volume:  Decreased  Mood:  Anxious  Affect:  Congruent  Thought Process:  Linear  Orientation:  Full (Time, Place, and Person)  Thought Content:  Negative  Suicidal Thoughts:  No  Homicidal Thoughts:  No  Memory:  Immediate;   Good Recent;   Good Remote;   Good  Judgement:  Good  Insight:  Good  Psychomotor Activity:  Negative  Concentration:  Good  Recall:  Good  Fund of Knowledge: Good  Language: Good  Akathisia:  Negative  Handed:  Right  AIMS (if indicated):  N/A  Assets:  Social Support  ADL's:  Intact  Cognition: WNL   Sleep: fair, improved with remeron   Is the patient at risk to self?  No. Has the patient been a risk to self in the past 6 months?  No. Has the patient been a risk to self within the distant past?  No. Is the patient a risk to others?  No. Has the patient been a risk to others in the past 6 months?  No. Has the patient been a risk to others within the distant past?  No.  Current Medications: Current Outpatient Prescriptions  Medication Sig Dispense Refill  . acetaminophen (TYLENOL) 325 MG tablet Take 2 tablets (650 mg total) by mouth every 6 (six) hours as needed for moderate pain. 15 tablet 0  . albuterol (VENTOLIN HFA) 108 (90 BASE) MCG/ACT inhaler Inhale 2 puffs into the lungs every 4 (four) hours as needed.    Marland Kitchen aspirin 81 MG tablet Take 1 tablet by mouth daily.    Marland Kitchen atorvastatin (LIPITOR) 40 MG tablet Take 1 tablet (40 mg total) by mouth daily. 30 tablet 5  . clonazePAM (KLONOPIN) 1 MG tablet Take 1 tablet (1 mg total) by mouth 3 (three) times daily. 90 tablet 2  . DULoxetine (CYMBALTA) 60 MG capsule Take 1 capsule (60 mg total) by mouth 2 (two) times daily. 60 capsule 2  . Fluticasone-Salmeterol (ADVAIR DISKUS) 250-50 MCG/DOSE AEPB Inhale 1 puff into the lungs 2 (two) times daily. 60 each 5  . metoprolol succinate (TOPROL-XL) 25 MG 24 hr tablet Take 0.5 tablets (12.5 mg total) by mouth daily. 30 tablet 5  . mirtazapine (REMERON) 15 MG tablet Take 1 tablet (15 mg total) by mouth at bedtime. 30 tablet 2  . morphine (MS CONTIN) 15 MG 12 hr tablet TK 1 T PO Q 12 H PRN.  0  . omeprazole (PRILOSEC) 20 MG capsule Take 1 capsule (20 mg total) by mouth daily. 30 capsule 5  . tiotropium (SPIRIVA HANDIHALER) 18 MCG inhalation capsule Place 1 capsule (18 mcg total) into inhaler and inhale daily. 30 capsule 5   No current facility-administered medications for this visit.    Medical Decision Making:  Established Problem, Stable/Improving (1)  Treatment Plan Summary:Medication management and  Plan   A depressive disorder, recurrent, moderate we will now increase the patient's Cymbalta back to her previous dose of 60 mg twice a day. We'll continue the Remeron at 15 mg at bedtime.  Generalized anxiety disorder. We will continue her clonazepam 1 mg 3 times daily. I strongly encourage patient to engage in therapy as it appears many of her stressors are chronic in nature. She will follow up in 2 months.   Faith Rogue 12/06/2014, 4:45 PM

## 2015-01-24 NOTE — Telephone Encounter (Signed)
refilled 

## 2015-01-25 ENCOUNTER — Telehealth: Payer: Self-pay | Admitting: Family Medicine

## 2015-01-25 NOTE — Telephone Encounter (Signed)
NEEDS REFILL ON HER DULERA . PT SAID THAT SHE NEEDS THIS AS SOON AS POSSIBLE. [place]

## 2015-01-25 NOTE — Telephone Encounter (Signed)
Who is giving her Ruthe Mannan, on our list she has been taking Advair, please confirm with her pharmacy. Thank you

## 2015-01-26 NOTE — Telephone Encounter (Signed)
I spoke with pharmacist on 01/23/2015 and they state patient is not [facility], patient only has on file Advair. I tried calling patient and her spouse phone but unable to reach her due to her phone not being available and her spouse phone Kasandra Knudsen being disconnected.

## 2015-01-27 ENCOUNTER — Other Ambulatory Visit: Payer: Self-pay

## 2015-01-27 DIAGNOSIS — K219 Gastro-esophageal reflux disease without esophagitis: Secondary | ICD-10-CM

## 2015-01-27 MED ORDER — OMEPRAZOLE 20 MG PO CPDR: 20.0000 mg | DELAYED_RELEASE_CAPSULE | Freq: Every day | ORAL | Status: DC

## 2015-01-27 NOTE — Telephone Encounter (Signed)
Patient is requesting a 90 day supply be sent to [facility].

## 2015-01-31 ENCOUNTER — Other Ambulatory Visit: Payer: Self-pay | Admitting: Family Medicine

## 2015-01-31 ENCOUNTER — Telehealth: Payer: Self-pay | Admitting: Family Medicine

## 2015-01-31 MED ORDER — ALBUTEROL SULFATE HFA 108 (90 BASE) MCG/ACT IN AERS: 2.0000 | INHALATION_SPRAY | RESPIRATORY_TRACT | Status: DC | PRN

## 2015-01-31 NOTE — Telephone Encounter (Signed)
Patient is really needing a inhaler, possibly a fast acting on. Please send to [facility]

## 2015-01-31 NOTE — Telephone Encounter (Signed)
Unable to reach patient by phone, patient phone does not have a voicemail box and husband number is disconnected. Informed patient pharmacy to have patient schedule a follow up with Dr. Ancil Boozer

## 2015-01-31 NOTE — Telephone Encounter (Signed)
Sent for one month, but she needs to schedule a follow up

## 2015-02-21 ENCOUNTER — Other Ambulatory Visit: Payer: Self-pay | Admitting: Family Medicine

## 2015-02-21 NOTE — Telephone Encounter (Signed)
Patient requesting refill. 

## 2015-02-21 NOTE — Telephone Encounter (Signed)
Patient verbally informed and appt made for 03-16-15

## 2015-03-16 ENCOUNTER — Ambulatory Visit: Payer: Self-pay | Admitting: Family Medicine

## 2015-03-20 ENCOUNTER — Other Ambulatory Visit: Payer: Self-pay | Admitting: Family Medicine

## 2015-03-20 ENCOUNTER — Other Ambulatory Visit: Payer: Self-pay

## 2015-03-20 NOTE — Telephone Encounter (Signed)
Received fax requesting refill on clonazepam 1mg  pt will not have enough medication to last until appt.  Pt last seen on  12-06-14 next appt 03-28-15.

## 2015-03-20 NOTE — Telephone Encounter (Signed)
Patient requesting refill. 

## 2015-03-21 ENCOUNTER — Ambulatory Visit: Payer: Self-pay | Admitting: Family Medicine

## 2015-03-21 NOTE — Telephone Encounter (Signed)
received faxed requesting refill for clonazepam 1mg  pt will not have enough to last until appt.

## 2015-03-23 ENCOUNTER — Encounter: Payer: Self-pay | Admitting: Family Medicine

## 2015-03-23 ENCOUNTER — Ambulatory Visit (INDEPENDENT_AMBULATORY_CARE_PROVIDER_SITE_OTHER): Payer: PPO | Admitting: Family Medicine

## 2015-03-23 VITALS — BP 120/64 | HR 84 | Temp 98.6°F | Resp 14 | Ht 65.0 in | Wt 154.1 lb

## 2015-03-23 DIAGNOSIS — J302 Other seasonal allergic rhinitis: Secondary | ICD-10-CM

## 2015-03-23 DIAGNOSIS — D692 Other nonthrombocytopenic purpura: Secondary | ICD-10-CM | POA: Insufficient documentation

## 2015-03-23 DIAGNOSIS — E785 Hyperlipidemia, unspecified: Secondary | ICD-10-CM | POA: Diagnosis not present

## 2015-03-23 DIAGNOSIS — E876 Hypokalemia: Secondary | ICD-10-CM

## 2015-03-23 DIAGNOSIS — I10 Essential (primary) hypertension: Secondary | ICD-10-CM

## 2015-03-23 DIAGNOSIS — R739 Hyperglycemia, unspecified: Secondary | ICD-10-CM | POA: Diagnosis not present

## 2015-03-23 DIAGNOSIS — J449 Chronic obstructive pulmonary disease, unspecified: Secondary | ICD-10-CM

## 2015-03-23 DIAGNOSIS — E559 Vitamin D deficiency, unspecified: Secondary | ICD-10-CM | POA: Diagnosis not present

## 2015-03-23 DIAGNOSIS — K219 Gastro-esophageal reflux disease without esophagitis: Secondary | ICD-10-CM

## 2015-03-23 DIAGNOSIS — J3089 Other allergic rhinitis: Secondary | ICD-10-CM | POA: Insufficient documentation

## 2015-03-23 DIAGNOSIS — I69359 Hemiplegia and hemiparesis following cerebral infarction affecting unspecified side: Secondary | ICD-10-CM

## 2015-03-23 DIAGNOSIS — Z79899 Other long term (current) drug therapy: Secondary | ICD-10-CM | POA: Diagnosis not present

## 2015-03-23 MED ORDER — LORATADINE 10 MG PO TABS: 10.0000 mg | ORAL_TABLET | Freq: Every day | ORAL | Status: DC

## 2015-03-23 MED ORDER — RANITIDINE HCL 300 MG PO CAPS: 300.0000 mg | ORAL_CAPSULE | Freq: Every evening | ORAL | Status: DC

## 2015-03-23 MED ORDER — FLUTICASONE PROPIONATE 50 MCG/ACT NA SUSP: 2.0000 | Freq: Every day | NASAL | Status: DC

## 2015-03-23 MED ORDER — METOPROLOL SUCCINATE ER 25 MG PO TB24: 12.5000 mg | ORAL_TABLET | Freq: Every day | ORAL | Status: DC
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
## 2015-03-24 LAB — LIPID PANEL
CHOL/HDL RATIO: 2.7 ratio (ref 0.0–4.4)
Cholesterol, Total: 118 mg/dL (ref 100–199)
HDL: 44 mg/dL (ref >39)
LDL Calculated: 61 mg/dL (ref 0–99)
Triglycerides: 63 mg/dL (ref 0–149)
VLDL CHOLESTEROL CAL: 13 mg/dL (ref 5–40)

## 2015-03-24 LAB — VITAMIN D 25 HYDROXY (VIT D DEFICIENCY, FRACTURES): VIT D 25 HYDROXY: 35.3 ng/mL (ref 30.0–100.0)

## 2015-03-24 LAB — COMPREHENSIVE METABOLIC PANEL
A/G RATIO: 1.4 (ref 1.1–2.5)
ALK PHOS: 112 IU/L (ref 39–117)
ALT: 9 IU/L (ref 0–32)
AST: 17 IU/L (ref 0–40)
Albumin: 3.6 g/dL (ref 3.5–4.8)
BUN/Creatinine Ratio: 12 (ref 11–26)
BUN: 10 mg/dL (ref 8–27)
Bilirubin Total: 0.3 mg/dL (ref 0.0–1.2)
CO2: 27 mmol/L (ref 18–29)
Calcium: 9.1 mg/dL (ref 8.7–10.3)
Chloride: 101 mmol/L (ref 96–106)
Creatinine, Ser: 0.82 mg/dL (ref 0.57–1.00)
GFR calc Af Amer: 83 mL/min/{1.73_m2} (ref >59)
GFR calc non Af Amer: 72 mL/min/{1.73_m2} (ref >59)
GLOBULIN, TOTAL: 2.5 g/dL (ref 1.5–4.5)
Glucose: 97 mg/dL (ref 65–99)
POTASSIUM: 4.7 mmol/L (ref 3.5–5.2)
SODIUM: 144 mmol/L (ref 134–144)
Total Protein: 6.1 g/dL (ref 6.0–8.5)

## 2015-03-24 LAB — HEMOGLOBIN A1C
Est. average glucose Bld gHb Est-mCnc: 117 mg/dL
HEMOGLOBIN A1C: 5.7 % — AB (ref 4.8–5.6)

## 2015-03-27 ENCOUNTER — Other Ambulatory Visit: Payer: Self-pay

## 2015-03-27 ENCOUNTER — Other Ambulatory Visit: Payer: Self-pay | Admitting: Family Medicine

## 2015-03-27 NOTE — Telephone Encounter (Signed)
PER DR. RAVI , CALL IN 2 TABLETS ONLY

## 2015-03-27 NOTE — Telephone Encounter (Signed)
received a fax requesting refills pt last seen on 12-06-14 next appt 03-27-15 refill on clonazepam 1mg 

## 2015-03-27 NOTE — Telephone Encounter (Signed)
PT STOPPED BY OUR OFFICE. PT STATES SHE NEEDED RX SHE HAS NO MORE AND SHE IS GOING THREW WITHDRAWALS.  PT WAS TOLD THAT SHE HAD APPT FOR TOMORROW. PT AWARE BUT STATES SHE NEEDS SOMETHING.

## 2015-03-27 NOTE — Telephone Encounter (Signed)
CALLED IN 2 TABLETS ONLY

## 2015-03-27 NOTE — Telephone Encounter (Signed)
CALLED PATIENT AND NOTIFIED THAT JUST TWO TABLETS WAS CALLED INTO PHARMACY

## 2015-03-28 ENCOUNTER — Encounter: Payer: Self-pay | Admitting: Psychiatry

## 2015-03-28 ENCOUNTER — Telehealth: Payer: Self-pay | Admitting: Family Medicine

## 2015-03-28 ENCOUNTER — Ambulatory Visit (INDEPENDENT_AMBULATORY_CARE_PROVIDER_SITE_OTHER): Payer: PPO | Admitting: Psychiatry

## 2015-03-28 DIAGNOSIS — F331 Major depressive disorder, recurrent, moderate: Secondary | ICD-10-CM | POA: Diagnosis not present

## 2015-03-28 DIAGNOSIS — F411 Generalized anxiety disorder: Secondary | ICD-10-CM | POA: Diagnosis not present

## 2015-03-28 MED ORDER — CLONAZEPAM 0.5 MG PO TABS: ORAL_TABLET | ORAL | Status: DC

## 2015-03-28 MED ORDER — MIRTAZAPINE 30 MG PO TABS: 30.0000 mg | ORAL_TABLET | Freq: Every day | ORAL | Status: DC

## 2015-03-28 MED ORDER — DULOXETINE HCL 60 MG PO CPEP
60.0000 mg | ORAL_CAPSULE | Freq: Two times a day (BID) | ORAL | Status: DC
Start: 2015-03-28 — End: 2015-05-02

## 2015-03-28 NOTE — Progress Notes (Signed)
Patient ID: Beth [name], female   DOB: 03-Aug-1945, 70 y.o.   MRN: [id number] [facility] MD/PA/NP OP Progress Note  03/28/2015 2:12 PM Beth [name]  MRN:  [id number]   Subjective: Patient is a 70 year old female with history of generalized anxiety disorder and major depressive disorder,she was previously seen by Dr. Jimmye Norman and her last appointment was in November 2016 . Today's appointment is the first appointment for this patient with this clinician. She reports doing okay. Continues to talk about the estrangement with her daughter, she agrees that she obsesses about this all the time. She states being depressed in relation to situation with her daughter. Continues to have some trouble sleeping. Endorsing financial stressors and issues with husband's memory loss.  She denies any suicidal thoughts.    Chief Complaint: Some depression Chief Complaint    Follow-up; Medication Refill     Visit Diagnosis:   No diagnosis found.  Past Medical History:  Past Medical History  Diagnosis Date  . Vitamin D deficiency   . IBS (irritable bowel syndrome)   . COPD (chronic obstructive pulmonary disease) ([place])   . Hypertension   . Allergy   . CVA (cerebral infarction)   . Headache   . Fibromyalgia   . Hyperlipidemia   . Anxiety   . Depression   . Stroke [facility])     Past Surgical History  Procedure Laterality Date  . Breast surgery  2011    biopsy  . Cervical discectomy    . Appendectomy    . Abdominal hysterectomy    . Cholecystectomy    . Sinusotomy     Family History:  Family History  Problem Relation Age of Onset  . Anxiety disorder Mother   . Depression Mother   . Cancer Father   . Gallbladder disease Father   . Alcohol abuse Father   . Depression Father    Social History:  Social History   Social History  . Marital Status: Single    Spouse Name: N/A  . Number of Children: N/A  . Years of Education: N/A   Social History Main Topics  . Smoking status: Former Smoker --  1.00 packs/day for 35 years    Types: Cigarettes    Quit date: 01/29/2012  . Smokeless tobacco: Never Used  . Alcohol Use: No  . Drug Use: No  . Sexual Activity: No   Other Topics Concern  . None   Social History Narrative   Additional History:   Assessment:   Musculoskeletal: Strength & Muscle Tone: within normal limits Gait & Station: Slow Patient leans: N/A  Psychiatric Specialty Exam: HPI  Review of Systems  Psychiatric/Behavioral: Positive for depression (he continues to have depression but does state the medications do help.). Negative for suicidal ideas, hallucinations, memory loss and substance abuse. The patient is nervous/anxious and has insomnia (Stated the mirtazapine did help her with insomnia.).     Blood pressure 142/78, pulse 104, temperature 98.8 F (37.1 C), temperature source Tympanic, height 5\' 5"  (1.651 m), weight 152 lb 9.6 oz (69.219 kg), SpO2 88 %.Body mass index is 25.39 kg/(m^2).  General Appearance: Well Groomed  Eye Contact:  Good  Speech:  slow  Volume:  Decreased  Mood:  Anxious  Affect:  Congruent  Thought Process:  Linear  Orientation:  Full (Time, Place, and Person)  Thought Content:  Negative  Suicidal Thoughts:  No  Homicidal Thoughts:  No  Memory:  Immediate;   Good Recent;   Good Remote;  Good  Judgement:  Good  Insight:  Good  Psychomotor Activity:  Negative  Concentration:  Good  Recall:  Good  Fund of Knowledge: Good  Language: Good  Akathisia:  Negative  Handed:  Right  AIMS (if indicated):  N/A  Assets:  Social Support  ADL's:  Intact  Cognition: WNL  Sleep: fair, improved with remeron   Is the patient at risk to self?  No. Has the patient been a risk to self in the past 6 months?  No. Has the patient been a risk to self within the distant past?  No. Is the patient a risk to others?  No. Has the patient been a risk to others in the past 6 months?  No. Has the patient been a risk to others within the distant  past?  No.  Current Medications: Current Outpatient Prescriptions  Medication Sig Dispense Refill  . acetaminophen (TYLENOL) 325 MG tablet Take 2 tablets (650 mg total) by mouth every 6 (six) hours as needed for moderate pain. 15 tablet 0  . aspirin 81 MG tablet Take 1 tablet by mouth daily.    Marland Kitchen atorvastatin (LIPITOR) 40 MG tablet Take 1 tablet (40 mg total) by mouth daily. 30 tablet 5  . clonazePAM (KLONOPIN) 1 MG tablet Take 1 tablet (1 mg total) by mouth 3 (three) times daily. 90 tablet 2  . DULoxetine (CYMBALTA) 60 MG capsule Take 1 capsule (60 mg total) by mouth 2 (two) times daily. 60 capsule 2  . fluticasone (FLONASE) 50 MCG/ACT nasal spray Place 2 sprays into both nostrils daily. 16 g 2  . Fluticasone-Salmeterol (ADVAIR DISKUS) 250-50 MCG/DOSE AEPB Inhale 1 puff into the lungs 2 (two) times daily. 60 each 5  . loratadine (CLARITIN) 10 MG tablet Take 1 tablet (10 mg total) by mouth daily. 30 tablet 2  . metoprolol succinate (TOPROL-XL) 25 MG 24 hr tablet Take 0.5 tablets (12.5 mg total) by mouth daily. 30 tablet 5  . mirtazapine (REMERON) 15 MG tablet Take 1 tablet (15 mg total) by mouth at bedtime. 30 tablet 2  . omeprazole (PRILOSEC) 20 MG capsule Take 1 capsule (20 mg total) by mouth daily. 90 capsule 1  . ranitidine (ZANTAC) 300 MG capsule Take 1 capsule (300 mg total) by mouth every evening. 30 capsule 5  . SPIRIVA HANDIHALER 18 MCG inhalation capsule PLACE 1 CAPSULE INTO INHALER AND INHALE DAILY. 30 capsule 0  . VENTOLIN HFA 108 (90 Base) MCG/ACT inhaler INHALE 2 PUFFS INTO THE LUNGS EVERY 4 HOURS AS NEEDED 18 g 0   No current facility-administered medications for this visit.    Medical Decision Making:  Established Problem, Stable/Improving (1)  Treatment Plan Summary:Medication management and Plan   A depressive disorder, recurrent, moderate   Continue Cymbalta at 60mg  po bid, with plan to taper in the future. Increase Remeron to 30 mg at bedtime. Discussed with patient  the increased risk off falls and sedation if she combines this with the Klonopin at bedtime. Patient acknowledges this.  Generalized anxiety disorder.  Decrease Clonazepam to 0.5mg  po tid for 2 weeks and then decrease to 0.5mg  po bid for 1 week  And then start Klonopin at 0.5mg  po daily.  Patient encouraged to engage in therapy as it appears many of her stressors are chronic in nature. RTC in 1 month or call before if necessary.   Leng Montesdeoca 03/28/2015, 2:12 PM

## 2015-03-28 NOTE — Telephone Encounter (Signed)
Pt needs a refill on her asthma medication to be called into [facility] in [place]. Pt states this is the rescue inhaler that needs a refill.

## 2015-04-12 ENCOUNTER — Telehealth: Payer: Self-pay | Admitting: Family Medicine

## 2015-04-12 DIAGNOSIS — J441 Chronic obstructive pulmonary disease with (acute) exacerbation: Secondary | ICD-10-CM

## 2015-04-12 MED ORDER — ALBUTEROL SULFATE HFA 108 (90 BASE) MCG/ACT IN AERS: INHALATION_SPRAY | RESPIRATORY_TRACT | Status: DC

## 2015-04-12 MED ORDER — FLUTICASONE-SALMETEROL 250-50 MCG/DOSE IN AEPB: 1.0000 | INHALATION_SPRAY | Freq: Two times a day (BID) | RESPIRATORY_TRACT | Status: DC

## 2015-04-12 MED ORDER — TIOTROPIUM BROMIDE MONOHYDRATE 18 MCG IN CAPS: 18.0000 ug | ORAL_CAPSULE | Freq: Every day | RESPIRATORY_TRACT | Status: DC

## 2015-04-12 NOTE — Telephone Encounter (Signed)
Pt would like a refill on her asthma meds to be called into her pharmacy. Pt also called for an appt for chest pain and sweats. I explained to the pt that she needs to go to ER and she refused and she did not need to speak to a nurse.

## 2015-04-12 NOTE — Telephone Encounter (Signed)
She needs to go to [facility]

## 2015-04-20 ENCOUNTER — Ambulatory Visit (INDEPENDENT_AMBULATORY_CARE_PROVIDER_SITE_OTHER): Payer: PPO | Admitting: Family Medicine

## 2015-04-20 ENCOUNTER — Encounter: Payer: Self-pay | Admitting: Family Medicine

## 2015-04-20 VITALS — BP 118/66 | HR 103 | Temp 97.6°F | Resp 18 | Wt 149.9 lb

## 2015-04-20 DIAGNOSIS — R600 Localized edema: Secondary | ICD-10-CM

## 2015-04-20 DIAGNOSIS — R0789 Other chest pain: Secondary | ICD-10-CM | POA: Diagnosis not present

## 2015-04-20 DIAGNOSIS — F339 Major depressive disorder, recurrent, unspecified: Secondary | ICD-10-CM | POA: Diagnosis not present

## 2015-04-20 NOTE — Progress Notes (Signed)
Name: Beth [name]   MRN: [id number]    DOB: [birth year]   Date:04/20/2015       Progress Note  Subjective  Chief Complaint  Chief Complaint  Patient presents with  . Chest Pain    patient stated that she has had intermittent pain for a couple of months. patient is asthmatic and has COPD. patient stated that the pain radiates to her right arm and she breaks out in a big sweat.  . Shortness of Breath    HPI  Chest pain: she states that over the past few months, she has noticed substernal chest pain described as pressure, without radiation. She states she has right arm pain but is unrelated to chest pain. Pain can be at rest or during activity. It is associated with diaphoresis and increase in SOB, also nausea with episodes. Episodes happens a few times a month and can last up to 1 hour per episode. She also states gets diaphoretic with any kind of physical activity and not always associated with chest pain. She has seen a cardiologist in 2011 and stress test was negative. She has been more stressed lately. Son diagnosed with cirrhosis of liver, and psychiatrist decreased dose of Klonopin from 3 mg daily to 1 mg daily this past month. New psychiatrist.    Patient Active Problem List   Diagnosis Date Noted  . Seasonal allergic rhinitis 03/23/2015  . Hyperglycemia 03/23/2015  . Senile purpura ([place]) 03/23/2015  . Perennial allergic rhinitis with seasonal variation 03/23/2015  . Marital problems 10/31/2014  . Migraine without aura and without status migrainosus, not intractable 10/31/2014  . Chronic LBP 08/09/2014  . Colon polyp 08/09/2014  . COPD, severe ([place]) 08/09/2014  . CVA, old, hemiparesis ([place]) 08/09/2014  . Dyslipidemia 08/09/2014  . Dysfunction of eustachian tube 08/09/2014  . Fibromyalgia syndrome 08/09/2014  . Gastro-esophageal reflux disease without esophagitis 08/09/2014  . Benign migrating glossitis 08/09/2014  . Cerebrovascular accident, old 08/09/2014  . IBS  (irritable bowel syndrome) 08/09/2014  . Low back pain with radiation 08/09/2014  . Chronic recurrent major depressive disorder ([place]) 08/09/2014  . Dysmetabolic syndrome [id number]  . OP (osteoporosis) 08/09/2014  . Vitamin D deficiency 08/09/2014  . Benign hypertension 07/19/2013  . Benign neoplasm of skin of trunk 06/03/2013  . H/O: pneumonia 09/25/2012    Past Surgical History  Procedure Laterality Date  . Breast surgery  2011    biopsy  . Cervical discectomy    . Appendectomy    . Abdominal hysterectomy    . Cholecystectomy    . Sinusotomy      Family History  Problem Relation Age of Onset  . Anxiety disorder Mother   . Depression Mother   . Cancer Father   . Gallbladder disease Father   . Alcohol abuse Father   . Depression Father     Social History   Social History  . Marital Status: Single    Spouse Name: N/A  . Number of Children: N/A  . Years of Education: N/A   Occupational History  . Not on file.   Social History Main Topics  . Smoking status: Former Smoker -- 1.00 packs/day for 35 years    Types: Cigarettes    Quit date: 01/29/2012  . Smokeless tobacco: Never Used  . Alcohol Use: No  . Drug Use: No  . Sexual Activity: No   Other Topics Concern  . Not on file   Social History Narrative     Current outpatient prescriptions:  .  acetaminophen (TYLENOL) 325 MG tablet, Take 2 tablets (650 mg total) by mouth every 6 (six) hours as needed for moderate pain., Disp: 15 tablet, Rfl: 0 .  albuterol (VENTOLIN HFA) 108 (90 Base) MCG/ACT inhaler, INHALE 2 PUFFS INTO THE LUNGS EVERY 4 HOURS AS NEEDED, Disp: 18 g, Rfl: 0 .  aspirin 81 MG tablet, Take 1 tablet by mouth daily., Disp: , Rfl:  .  atorvastatin (LIPITOR) 40 MG tablet, Take 1 tablet (40 mg total) by mouth daily., Disp: 30 tablet, Rfl: 5 .  clonazePAM (KLONOPIN) 0.5 MG tablet, Take 0.5mg  po tid for 2 weeks, then 0.5mg  po bid ., Disp: 90 tablet, Rfl: 0 .  DULoxetine (CYMBALTA) 60 MG capsule, Take 1  capsule (60 mg total) by mouth 2 (two) times daily., Disp: 60 capsule, Rfl: 2 .  fluticasone (FLONASE) 50 MCG/ACT nasal spray, Place 2 sprays into both nostrils daily., Disp: 16 g, Rfl: 2 .  Fluticasone-Salmeterol (ADVAIR DISKUS) 250-50 MCG/DOSE AEPB, Inhale 1 puff into the lungs 2 (two) times daily., Disp: 60 each, Rfl: 2 .  loratadine (CLARITIN) 10 MG tablet, Take 1 tablet (10 mg total) by mouth daily., Disp: 30 tablet, Rfl: 2 .  metoprolol succinate (TOPROL-XL) 25 MG 24 hr tablet, Take 0.5 tablets (12.5 mg total) by mouth daily., Disp: 30 tablet, Rfl: 5 .  mirtazapine (REMERON) 15 MG tablet, TK 1 T PO QHS., Disp: , Rfl: 2 .  morphine (MS CONTIN) 15 MG 12 hr tablet, Take 15 mg by mouth every 12 (twelve) hours as needed., Disp: , Rfl: 0 .  omeprazole (PRILOSEC) 20 MG capsule, Take 1 capsule (20 mg total) by mouth daily., Disp: 90 capsule, Rfl: 1 .  ranitidine (ZANTAC) 300 MG capsule, Take 1 capsule (300 mg total) by mouth every evening., Disp: 30 capsule, Rfl: 5 .  tiotropium (SPIRIVA HANDIHALER) 18 MCG inhalation capsule, Place 1 capsule (18 mcg total) into inhaler and inhale daily., Disp: 30 capsule, Rfl: 2  Allergies  Allergen Reactions  . Bextra  [Valdecoxib]   . Compazine  [Prochlorperazine Edisylate]   . Lithium Carbonate   . Lyrica [Pregabalin]      ROS  Ten systems reviewed and is negative except as mentioned in HPI - she continues to not have a good appetite, she has chronic SOB and cough , mild lower extremity of both legs, and has some orthopnea  Objective  Filed Vitals:   04/20/15 1336  BP: 118/66  Pulse: 103  Temp: 97.6 F (36.4 C)  TempSrc: Oral  Resp: 18  Weight: 149 lb 14.4 oz (67.994 kg)  SpO2: 93%    Body mass index is 24.94 kg/(m^2).  Physical Exam  Constitutional: Patient appears well-developed and well-nourished. Obese  No distress.  HEENT: head atraumatic, normocephalic, pupils equal and reactive to light, neck supple, throat within normal  limits Cardiovascular: Normal rate, regular rhythm and normal heart sounds.  No murmur heard. No BLE edema. Pulmonary/Chest: Effort normal and breath sounds normal. No respiratory distress. Abdominal: Soft.  There is no tenderness. Psychiatric: Patient has a normal mood and affect. behavior is normal. Judgment and thought content normal. Neurological: mild hand tremors   Recent Results (from the past 2160 hour(s))  Lipid panel     Status: None   Collection Time: 03/23/15  3:36 PM  Result Value Ref Range   Cholesterol, Total 118 100 - 199 mg/dL   Triglycerides 63 0 - 149 mg/dL   HDL 44 >39 mg/dL   VLDL Cholesterol Cal 13 5 -  40 mg/dL   LDL Calculated 61 0 - 99 mg/dL   Chol/HDL Ratio 2.7 0.0 - 4.4 ratio units    Comment:                                   T. Chol/HDL Ratio                                             Men  Women                               1/2 Avg.Risk  3.4    3.3                                   Avg.Risk  5.0    4.4                                2X Avg.Risk  9.6    7.1                                3X Avg.Risk 23.4   11.0   Hemoglobin A1c     Status: Abnormal   Collection Time: 03/23/15  3:36 PM  Result Value Ref Range   Hgb A1c MFr Bld 5.7 (H) 4.8 - 5.6 %    Comment:          Pre-diabetes: 5.7 - 6.4          Diabetes: >6.4          Glycemic control for adults with diabetes: <7.0    Est. average glucose Bld gHb Est-mCnc 117 mg/dL  Comprehensive metabolic panel     Status: None   Collection Time: 03/23/15  3:36 PM  Result Value Ref Range   Glucose 97 65 - 99 mg/dL   BUN 10 8 - 27 mg/dL   Creatinine, Ser 0.82 0.57 - 1.00 mg/dL   GFR calc non Af Amer 72 >59 mL/min/1.73   GFR calc Af Amer 83 >59 mL/min/1.73   BUN/Creatinine Ratio 12 11 - 26   Sodium 144 134 - 144 mmol/L   Potassium 4.7 3.5 - 5.2 mmol/L   Chloride 101 96 - 106 mmol/L   CO2 27 18 - 29 mmol/L   Calcium 9.1 8.7 - 10.3 mg/dL   Total Protein 6.1 6.0 - 8.5 g/dL   Albumin 3.6 3.5 - 4.8 g/dL    Globulin, Total 2.5 1.5 - 4.5 g/dL   Albumin/Globulin Ratio 1.4 1.1 - 2.5    Comment: **Effective April 10, 2015 the reference interval**   for A/G Ratio will be changing to:              Age                Female          Female           0 -  7 days       1.1 - 2.3       1.1 - 2.3  8 - 30 days       1.2 - 2.8       1.2 - 2.8           1 -  6 months     1.3 - 3.6       1.3 - 3.6    7 months -  5 years      1.5 - 2.6       1.5 - 2.6              > 5 years      1.2 - 2.2       1.2 - 2.2    Bilirubin Total 0.3 0.0 - 1.2 mg/dL   Alkaline Phosphatase 112 39 - 117 IU/L   AST 17 0 - 40 IU/L   ALT 9 0 - 32 IU/L  VITAMIN D 25 Hydroxy (Vit-D Deficiency, Fractures)     Status: None   Collection Time: 03/23/15  3:36 PM  Result Value Ref Range   Vit D, 25-Hydroxy 35.3 30.0 - 100.0 ng/mL    Comment: Vitamin D deficiency has been defined by the [place] and an Endocrine Society practice guideline as a level of serum 25-OH vitamin D less than 20 ng/mL (1,2). The Endocrine Society went on to further define vitamin D insufficiency as a level between 21 and 29 ng/mL (2). 1. IOM (Institute of Medicine). 2010. Dietary reference    intakes for calcium and D. [place]: The    [employer]. 2. Holick MF, B[place], Bischoff-Ferrari HA, et al.    Evaluation, treatment, and prevention of vitamin D    deficiency: an Endocrine Society clinical practice    guideline. JCEM. 2011 Jul; 96(7):1911-30.      PHQ2/9: Depression screen [facility] 2/9 04/20/2015 03/23/2015 08/09/2014  Decreased Interest 1 0 0  Down, Depressed, Hopeless 3 0 1  PHQ - 2 Score 4 0 1  Altered sleeping 3 - -  Tired, decreased energy 3 - -  Change in appetite 3 - -  Feeling bad or failure about yourself  3 - -  Trouble concentrating 3 - -  Moving slowly or fidgety/restless 0 - -  Suicidal thoughts 0 - -  PHQ-9 Score 19 - -     Fall Risk: Fall Risk  04/20/2015 03/23/2015 08/09/2014 08/09/2014  Falls  in the past year? No No Yes Yes  Number falls in past yr: - - 2 or more 2 or more  Injury with Fall? - - No No      Functional Status Survey: Is the patient deaf or have difficulty hearing?: No Does the patient have difficulty seeing, even when wearing glasses/contacts?: No Does the patient have difficulty concentrating, remembering, or making decisions?: No Does the patient have difficulty walking or climbing stairs?: Yes (at times due to back and hip pain) Does the patient have difficulty dressing or bathing?: No Does the patient have difficulty doing errands alone such as visiting a doctor's office or shopping?: No    Assessment & Plan  1. Other chest pain  - EKG 12-Lead - Ambulatory referral to Cardiology  2. Edema of both legs  Refer to cardiologist  3. Chronic recurrent major depressive disorder ([employer])  Continue follow up with Psychiatrist, symptoms are worse, recently switched to a new provider and is on lower dose of Klonopin

## 2015-04-20 NOTE — Addendum Note (Signed)
Addended by: Steele Sizer F on: 04/20/2015 02:13 PM   Modules accepted: Orders

## 2015-04-27 ENCOUNTER — Other Ambulatory Visit: Payer: Self-pay

## 2015-04-27 NOTE — Telephone Encounter (Signed)
Patient was given 90 tablets of 0.5mg  Klonopin on 03/02/2015 with the plan to taper. Her initial taper was to take 0.5 mg of Klonopin 3 times daily for 2 weeks and then decrease to 0.5 mg twice daily for 2 weeks and then take 0.5 mg daily for 2 weeks and discontinue. The 90 tablet should cover this taper. We will not be able to prescribe her any more Klonopin at this time.

## 2015-04-27 NOTE — Telephone Encounter (Signed)
Received a faxed requesting clonazepam.5mg 

## 2015-05-02 ENCOUNTER — Ambulatory Visit (INDEPENDENT_AMBULATORY_CARE_PROVIDER_SITE_OTHER): Payer: PPO | Admitting: Psychiatry

## 2015-05-02 ENCOUNTER — Encounter: Payer: Self-pay | Admitting: Psychiatry

## 2015-05-02 VITALS — BP 122/68 | HR 99 | Temp 98.6°F | Ht 65.0 in | Wt 148.6 lb

## 2015-05-02 DIAGNOSIS — F331 Major depressive disorder, recurrent, moderate: Secondary | ICD-10-CM

## 2015-05-02 DIAGNOSIS — F411 Generalized anxiety disorder: Secondary | ICD-10-CM

## 2015-05-02 MED ORDER — MIRTAZAPINE 15 MG PO TABS: 15.0000 mg | ORAL_TABLET | Freq: Every day | ORAL | Status: DC

## 2015-05-02 MED ORDER — CLONAZEPAM 0.5 MG PO TABS: 0.5000 mg | ORAL_TABLET | Freq: Two times a day (BID) | ORAL | Status: DC

## 2015-05-02 MED ORDER — DULOXETINE HCL 60 MG PO CPEP: 60.0000 mg | ORAL_CAPSULE | Freq: Every day | ORAL | Status: DC

## 2015-05-02 NOTE — Progress Notes (Signed)
Patient ID: Beth [name], female   DOB: January 22, 1946, 70 y.o.   MRN: [id number] [facility] MD/PA/NP OP Progress Note  05/02/2015 2:42 PM Beth [name]  MRN:  [id number]   Subjective: Patient is a 70 year old married female with history of major depression and generalized anxiety disorder presented for follow-up. She was previously seen by Dr. Einar Grad. She reported that her Klonopin dose was decreased at her previous appointment although she was getting 1 mg 3 times a day by Dr. Jimmye Norman. She reported that she has several stressors including relationship issues with her daughter as well as with her husband. She reported that her husband continues on fussing on her and she has to deal with him. She reported that she was doing well on her Klonopin dose and remains focused on the medication. She reported that she feels depressed she sleeps well with the help of the mirtazapine at night. She currently denied having any adverse effects with the mirtazapine. However she reported that she has been having hyperhidrosis related to the Cymbalta and she starts a lot. Patient currently denied having any suicidal homicidal ideations or plans. She was requesting her Klonopin dose to be increased again.     Chief Complaint: Some depression Chief Complaint    Follow-up; Medication Refill; Anxiety     Visit Diagnosis:     ICD-9-CM ICD-10-CM   1. GAD (generalized anxiety disorder) 300.02 F41.1     Past Medical History:  Past Medical History  Diagnosis Date  . Vitamin D deficiency   . IBS (irritable bowel syndrome)   . COPD (chronic obstructive pulmonary disease) ([place])   . Hypertension   . Allergy   . CVA (cerebral infarction)   . Headache   . Fibromyalgia   . Hyperlipidemia   . Anxiety   . Depression   . Stroke [facility])     Past Surgical History  Procedure Laterality Date  . Breast surgery  2011    biopsy  . Cervical discectomy    . Appendectomy    . Abdominal hysterectomy    . Cholecystectomy    .  Sinusotomy     Family History:  Family History  Problem Relation Age of Onset  . Anxiety disorder Mother   . Depression Mother   . Cancer Father   . Gallbladder disease Father   . Alcohol abuse Father   . Depression Father    Social History:  Social History   Social History  . Marital Status: Single    Spouse Name: N/A  . Number of Children: N/A  . Years of Education: N/A   Social History Main Topics  . Smoking status: Former Smoker -- 1.00 packs/day for 35 years    Types: Cigarettes    Quit date: 01/29/2012  . Smokeless tobacco: Never Used  . Alcohol Use: No  . Drug Use: No  . Sexual Activity: No   Other Topics Concern  . None   Social History Narrative   Additional History:   Assessment:   Musculoskeletal: Strength & Muscle Tone: within normal limits Gait & Station: Slow Patient leans: N/A  Psychiatric Specialty Exam: Anxiety Symptoms include insomnia (Stated the mirtazapine did help her with insomnia.) and nervous/anxious behavior. Patient reports no suicidal ideas.      Review of Systems  Psychiatric/Behavioral: Positive for depression (he continues to have depression but does state the medications do help.). Negative for suicidal ideas, hallucinations, memory loss and substance abuse. The patient is nervous/anxious and has insomnia (Stated the mirtazapine  did help her with insomnia.).     Blood pressure 122/68, pulse 99, temperature 98.6 F (37 C), temperature source Tympanic, height 5\' 5"  (1.651 m), weight 148 lb 9.6 oz (67.405 kg), SpO2 93 %.Body mass index is 24.73 kg/(m^2).  General Appearance: Well Groomed  Eye Contact:  Good  Speech:  slow  Volume:  Decreased  Mood:  Anxious  Affect:  Congruent  Thought Process:  Linear  Orientation:  Full (Time, Place, and Person)  Thought Content:  Negative  Suicidal Thoughts:  No  Homicidal Thoughts:  No  Memory:  Immediate;   Good Recent;   Good Remote;   Good  Judgement:  Good  Insight:  Good   Psychomotor Activity:  Negative  Concentration:  Good  Recall:  Good  Fund of Knowledge: Good  Language: Good  Akathisia:  Negative  Handed:  Right  AIMS (if indicated):  N/A  Assets:  Social Support  ADL's:  Intact  Cognition: WNL  Sleep: fair, improved with remeron   Is the patient at risk to self?  No. Has the patient been a risk to self in the past 6 months?  No. Has the patient been a risk to self within the distant past?  No. Is the patient a risk to others?  No. Has the patient been a risk to others in the past 6 months?  No. Has the patient been a risk to others within the distant past?  No.  Current Medications: Current Outpatient Prescriptions  Medication Sig Dispense Refill  . acetaminophen (TYLENOL) 325 MG tablet Take 2 tablets (650 mg total) by mouth every 6 (six) hours as needed for moderate pain. 15 tablet 0  . albuterol (VENTOLIN HFA) 108 (90 Base) MCG/ACT inhaler INHALE 2 PUFFS INTO THE LUNGS EVERY 4 HOURS AS NEEDED 18 g 0  . aspirin 81 MG tablet Take 1 tablet by mouth daily.    Marland Kitchen atorvastatin (LIPITOR) 40 MG tablet Take 1 tablet (40 mg total) by mouth daily. 30 tablet 5  . clonazePAM (KLONOPIN) 0.5 MG tablet Take 0.5mg  po tid for 2 weeks, then 0.5mg  po bid . 90 tablet 0  . DULoxetine (CYMBALTA) 60 MG capsule Take 1 capsule (60 mg total) by mouth 2 (two) times daily. 60 capsule 2  . fluticasone (FLONASE) 50 MCG/ACT nasal spray Place 2 sprays into both nostrils daily. 16 g 2  . Fluticasone-Salmeterol (ADVAIR DISKUS) 250-50 MCG/DOSE AEPB Inhale 1 puff into the lungs 2 (two) times daily. 60 each 2  . loratadine (CLARITIN) 10 MG tablet Take 1 tablet (10 mg total) by mouth daily. 30 tablet 2  . metoprolol succinate (TOPROL-XL) 25 MG 24 hr tablet Take 0.5 tablets (12.5 mg total) by mouth daily. 30 tablet 5  . mirtazapine (REMERON) 15 MG tablet TK 1 T PO QHS.  2  . mirtazapine (REMERON) 30 MG tablet     . morphine (MS CONTIN) 15 MG 12 hr tablet Take 15 mg by mouth every  12 (twelve) hours as needed.  0  . omeprazole (PRILOSEC) 20 MG capsule Take 1 capsule (20 mg total) by mouth daily. 90 capsule 1  . ranitidine (ZANTAC) 300 MG capsule Take 1 capsule (300 mg total) by mouth every evening. 30 capsule 5  . tiotropium (SPIRIVA HANDIHALER) 18 MCG inhalation capsule Place 1 capsule (18 mcg total) into inhaler and inhale daily. 30 capsule 2   No current facility-administered medications for this visit.    Medical Decision Making:  Established Problem, Stable/Improving (1)  Treatment Plan Summary:Medication management and Plan   Majordepressive disorder, recurrent, moderate   Continue Cymbalta at 60mg  po daily. I will decrease the dose as she is experiencing side effects including hyperhidrosis. Continue Remeron 15 mg at bedtime mg at bedtime.   Generalized anxiety disorder.  Continue Clonazepam to 0.5mg  po twice a day. Discussed with patient about other options including Valium and she requested Xanax. Advised her that we will discuss at her next appointment and she demonstrated understanding.    More than 50% of the time spent in psychoeducation, counseling and coordination of care.    This note was generated in part or whole with voice recognition software. Voice regonition is usually quite accurate but there are transcription errors that can and very often do occur. I apologize for any typographical errors that were not detected and corrected.   Rainey Pines, MD  05/02/2015, 2:42 PM

## 2015-05-23 ENCOUNTER — Other Ambulatory Visit: Payer: Self-pay | Admitting: Family Medicine

## 2015-06-08 ENCOUNTER — Telehealth: Payer: Self-pay

## 2015-06-08 NOTE — Telephone Encounter (Signed)
called in ok for refill along with a note that patient needs to make appt before anymore refill can be given.

## 2015-06-08 NOTE — Telephone Encounter (Signed)
pt called left message that she needed a refill on her medications. pt was last seen on  05-02-15 no new appt for f/u was made.

## 2015-06-29 ENCOUNTER — Other Ambulatory Visit: Payer: Self-pay | Admitting: Family Medicine

## 2015-06-29 ENCOUNTER — Other Ambulatory Visit: Payer: Self-pay | Admitting: Psychiatry

## 2015-06-29 NOTE — Telephone Encounter (Signed)
Patient requesting refill. 

## 2015-07-04 ENCOUNTER — Ambulatory Visit (INDEPENDENT_AMBULATORY_CARE_PROVIDER_SITE_OTHER): Payer: PPO | Admitting: Psychiatry

## 2015-07-04 ENCOUNTER — Encounter: Payer: Self-pay | Admitting: Psychiatry

## 2015-07-04 VITALS — BP 122/84 | HR 85 | Temp 97.4°F | Ht 65.0 in | Wt 147.0 lb

## 2015-07-04 DIAGNOSIS — F331 Major depressive disorder, recurrent, moderate: Secondary | ICD-10-CM

## 2015-07-04 DIAGNOSIS — F1394 Sedative, hypnotic or anxiolytic use, unspecified with sedative, hypnotic or anxiolytic-induced mood disorder: Secondary | ICD-10-CM

## 2015-07-04 MED ORDER — CLONAZEPAM 0.5 MG PO TABS: 0.5000 mg | ORAL_TABLET | Freq: Two times a day (BID) | ORAL | Status: DC

## 2015-07-04 MED ORDER — MIRTAZAPINE 15 MG PO TABS: 15.0000 mg | ORAL_TABLET | Freq: Every day | ORAL | Status: DC

## 2015-07-04 MED ORDER — DULOXETINE HCL 30 MG PO CPEP: 30.0000 mg | ORAL_CAPSULE | Freq: Every day | ORAL | Status: DC

## 2015-07-04 NOTE — Telephone Encounter (Signed)
Verbally informed prescription has been sent to pharmacy that we need to schedule appointment for July. She said she will call back to schedule the appointment.

## 2015-07-04 NOTE — Progress Notes (Signed)
Patient ID: Beth [name], female   DOB: 1945-09-15, 70 y.o.   MRN: [id number] [facility] MD/PA/NP OP Progress Note  07/04/2015 12:48 PM STPHANIE BOLING  MRN:  [id number]   Subjective: Patient is a 70 year old married female with history of major depression and generalized anxiety disorder presented for follow-up. She was previously seen by Dr. Einar Grad. She reported that she continues to feel anxious and she ran out of her Klonopin as she was taking more than him on prescribed and her husband was also using her Klonopin. Patient was given the prescription of Klonopin on May 11. She reported that she was becoming anxious so she started smoking cigarettes. She appears nervous and jittery during the interview. She reported that she also takes Cymbalta and Remeron. The Remeron helps with her sleep. She continues to focus on the dose of the Klonopin during this interview. Patient reported that she wants the Klonopin dose to be increased as she has called her insurance provider and they have told  her that she can take a higher dose of Klonopin if she is going to the withdrawal symptoms. We discussed about the medication and the dosage in detail again and she reported that she does not know how she ran out of the medication. She continues to focus on the benzodiazepine dosage at this time. Patient reported that she wants to try Trintillex for her depression at this time. She has been sleeping well with Remeron. She currently denied having any suicidal ideation or plans. She continues to have her hyperhidrosis related to the Cymbalta.       Chief Complaint:   Visit Diagnosis:     ICD-9-CM ICD-10-CM   1. MDD (major depressive disorder), recurrent episode, moderate (HCC) 296.32 F33.1   2. GAD (generalized anxiety disorder) 300.02 F41.1     Past Medical History:  Past Medical History  Diagnosis Date  . Vitamin D deficiency   . IBS (irritable bowel syndrome)   . COPD (chronic obstructive pulmonary disease)  ([place])   . Hypertension   . Allergy   . CVA (cerebral infarction)   . Headache   . Fibromyalgia   . Hyperlipidemia   . Anxiety   . Depression   . Stroke [facility])     Past Surgical History  Procedure Laterality Date  . Breast surgery  2011    biopsy  . Cervical discectomy    . Appendectomy    . Abdominal hysterectomy    . Cholecystectomy    . Sinusotomy     Family History:  Family History  Problem Relation Age of Onset  . Anxiety disorder Mother   . Depression Mother   . Cancer Father   . Gallbladder disease Father   . Alcohol abuse Father   . Depression Father    Social History:  Social History   Social History  . Marital Status: Single    Spouse Name: N/A  . Number of Children: N/A  . Years of Education: N/A   Social History Main Topics  . Smoking status: Former Smoker -- 1.00 packs/day for 35 years    Types: Cigarettes    Quit date: 01/29/2012  . Smokeless tobacco: Never Used  . Alcohol Use: No  . Drug Use: No  . Sexual Activity: No   Other Topics Concern  . Not on file   Social History Narrative   Additional History:   Assessment:   Musculoskeletal: Strength & Muscle Tone: within normal limits Gait & Station: Slow Patient leans: N/A  Psychiatric Specialty Exam: Anxiety Symptoms include nervous/anxious behavior. Patient reports no suicidal ideas. Insomnia: Stated the mirtazapine did help her with insomnia.      Review of Systems  Psychiatric/Behavioral: Positive for depression (he continues to have depression but does state the medications do help.) and substance abuse. Negative for suicidal ideas, hallucinations and memory loss. The patient is nervous/anxious. Insomnia: Stated the mirtazapine did help her with insomnia.     There were no vitals taken for this visit.There is no weight on file to calculate BMI.  General Appearance: Well Groomed  Eye Contact:  Good  Speech:  slow  Volume:  Decreased  Mood:  Anxious  Affect:  Congruent   Thought Process:  Linear  Orientation:  Full (Time, Place, and Person)  Thought Content:  Negative  Suicidal Thoughts:  No  Homicidal Thoughts:  No  Memory:  Immediate;   Good Recent;   Good Remote;   Good  Judgement:  Good  Insight:  Good  Psychomotor Activity:  Psychomotor Retardation  Concentration:  Good  Recall:  Good  Fund of Knowledge: Good  Language: Good  Akathisia:  Negative  Handed:  Right  AIMS (if indicated):  N/A  Assets:  Social Support  ADL's:  Intact  Cognition: WNL  Sleep: fair, improved with remeron   Is the patient at risk to self?  No. Has the patient been a risk to self in the past 6 months?  No. Has the patient been a risk to self within the distant past?  No. Is the patient a risk to others?  No. Has the patient been a risk to others in the past 6 months?  No. Has the patient been a risk to others within the distant past?  No.  Current Medications: Current Outpatient Prescriptions  Medication Sig Dispense Refill  . acetaminophen (TYLENOL) 325 MG tablet Take 2 tablets (650 mg total) by mouth every 6 (six) hours as needed for moderate pain. 15 tablet 0  . aspirin 81 MG tablet Take 1 tablet by mouth daily.    Marland Kitchen atorvastatin (LIPITOR) 40 MG tablet TAKE 1 TABLET(40 MG) BY MOUTH DAILY 30 tablet 1  . clonazePAM (KLONOPIN) 0.5 MG tablet Take 1 tablet (0.5 mg total) by mouth 2 (two) times daily. 60 tablet 0  . DULoxetine (CYMBALTA) 60 MG capsule Take 1 capsule (60 mg total) by mouth daily. 30 capsule 1  . fluticasone (FLONASE) 50 MCG/ACT nasal spray Place 2 sprays into both nostrils daily. 16 g 2  . Fluticasone-Salmeterol (ADVAIR DISKUS) 250-50 MCG/DOSE AEPB Inhale 1 puff into the lungs 2 (two) times daily. 60 each 2  . loratadine (CLARITIN) 10 MG tablet TAKE 1 TABLET(10 MG) BY MOUTH DAILY 30 tablet 5  . metoprolol succinate (TOPROL-XL) 25 MG 24 hr tablet Take 0.5 tablets (12.5 mg total) by mouth daily. 30 tablet 5  . mirtazapine (REMERON) 15 MG tablet TK 1  T PO QHS.  2  . mirtazapine (REMERON) 15 MG tablet Take 1 tablet (15 mg total) by mouth at bedtime. 30 tablet 1  . morphine (MS CONTIN) 15 MG 12 hr tablet Take 15 mg by mouth every 12 (twelve) hours as needed.  0  . omeprazole (PRILOSEC) 20 MG capsule Take 1 capsule (20 mg total) by mouth daily. 90 capsule 1  . ranitidine (ZANTAC) 300 MG capsule Take 1 capsule (300 mg total) by mouth every evening. 30 capsule 5  . tiotropium (SPIRIVA HANDIHALER) 18 MCG inhalation capsule Place 1 capsule (18 mcg  total) into inhaler and inhale daily. 30 capsule 2  . VENTOLIN HFA 108 (90 Base) MCG/ACT inhaler INHALE 2 PUFFS INTO THE LUNGS EVERY 4 HOURS AS NEEDED 18 g 0   No current facility-administered medications for this visit.    Medical Decision Making:  Established Problem, Stable/Improving (1)  Treatment Plan Summary:Medication management and Plan   Majordepressive disorder, recurrent, moderate   Continue Cymbalta at 30 mg po daily. I will decrease the dose as she is experiencing side effects including hyperhidrosis. We'll switch her medication at the next appointment. Continue Remeron 15 mg at bedtime mg at bedtime.   Generalized anxiety disorder.  Continue Clonazepam to 0.5mg  po twice a day. Patient will be given the 15 day supply at this time. She reported that her pharmacy might not fill the early refill as she has failed the last prescription on May 11. Discussed with patient about other options including Valium and she requested Xanax. Advised her that we will discuss at her next appointment and she demonstrated understanding.    More than 50% of the time spent in psychoeducation, counseling and coordination of care.    This note was generated in part or whole with voice recognition software. Voice regonition is usually quite accurate but there are transcription errors that can and very often do occur. I apologize for any typographical errors that were not detected and corrected.   Rainey Pines, MD  07/04/2015, 12:48 PM

## 2015-07-19 ENCOUNTER — Ambulatory Visit: Payer: PPO | Admitting: Psychiatry

## 2015-07-25 ENCOUNTER — Other Ambulatory Visit: Payer: Self-pay | Admitting: Family Medicine

## 2015-07-25 NOTE — Telephone Encounter (Signed)
PT SAID THAT SHE NEEDS REFILL ON HER VENTOLIN INHALER. [place]

## 2015-07-25 NOTE — Telephone Encounter (Signed)
Refill request was sent to Dr. Krichna Sowles for approval and submission.  

## 2015-07-27 MED ORDER — ALBUTEROL SULFATE HFA 108 (90 BASE) MCG/ACT IN AERS: INHALATION_SPRAY | RESPIRATORY_TRACT | Status: DC

## 2015-08-10 ENCOUNTER — Encounter: Payer: Self-pay | Admitting: Psychiatry

## 2015-08-10 ENCOUNTER — Ambulatory Visit (INDEPENDENT_AMBULATORY_CARE_PROVIDER_SITE_OTHER): Payer: PPO | Admitting: Psychiatry

## 2015-08-10 VITALS — BP 118/72 | HR 120 | Temp 98.8°F | Wt 142.0 lb

## 2015-08-10 DIAGNOSIS — F331 Major depressive disorder, recurrent, moderate: Secondary | ICD-10-CM | POA: Diagnosis not present

## 2015-08-10 DIAGNOSIS — F1394 Sedative, hypnotic or anxiolytic use, unspecified with sedative, hypnotic or anxiolytic-induced mood disorder: Secondary | ICD-10-CM | POA: Diagnosis not present

## 2015-08-10 DIAGNOSIS — F411 Generalized anxiety disorder: Secondary | ICD-10-CM | POA: Diagnosis not present

## 2015-08-10 MED ORDER — DULOXETINE HCL 30 MG PO CPEP: 30.0000 mg | ORAL_CAPSULE | Freq: Every day | ORAL | Status: DC

## 2015-08-10 MED ORDER — GABAPENTIN 300 MG PO CAPS: 300.0000 mg | ORAL_CAPSULE | Freq: Two times a day (BID) | ORAL | Status: DC

## 2015-08-10 MED ORDER — CLONAZEPAM 0.5 MG PO TABS: 0.5000 mg | ORAL_TABLET | Freq: Two times a day (BID) | ORAL | Status: DC

## 2015-08-10 MED ORDER — MIRTAZAPINE 15 MG PO TABS: 15.0000 mg | ORAL_TABLET | Freq: Every day | ORAL | Status: DC

## 2015-08-10 NOTE — Progress Notes (Signed)
Patient ID: Beth [name], female   DOB: May 01, 1945, 70 y.o.   MRN: [id number] [facility] MD/PA/NP OP Progress Note  08/10/2015 12:21 PM Beth [name]  MRN:  [id number]   Subjective: Patient is a 70 year old married female with history of major depression and generalized anxiety disorder presented for follow-up. She was previously seen by Dr. Einar [name]. She reported that she is feeling very anxious as she ran out of her Klonopin. Patient reported that she was unable to keep her appointment due to severe tooth pain. She appeared to be in pain patient reported that she has been and reported that she does not have any money to go to the dentist. Patient reported that she has been taking her pain medication as prescribed by her pain management doctor on a regular basis. Patient reported that she wants to continue her medications as prescribed. She continues to appear nervous and jittery during the interview. She also takes Remeron and Cymbalta as prescribed. Patient reported that the medications are helping her and she does not change in medications at this time as she wants to have her dental appointments first. We discussed about the medications at length and she agreed with the plan. She currently denied having any suicidal homicidal ideations or plans.   Chief Complaint:  Chief Complaint    Follow-up; Medication Refill     Visit Diagnosis:     ICD-9-CM ICD-10-CM   1. MDD (major depressive disorder), recurrent episode, moderate (HCC) 296.32 F33.1   2. Sedative, hypnotic or anxiolytic-induced mood disorder (HCC) 292.84 F13.94    E980.2    3. GAD (generalized anxiety disorder) 300.02 F41.1     Past Medical History:  Past Medical History  Diagnosis Date  . Vitamin D deficiency   . IBS (irritable bowel syndrome)   . COPD (chronic obstructive pulmonary disease) ([place])   . Hypertension   . Allergy   . CVA (cerebral infarction)   . Headache   . Fibromyalgia   . Hyperlipidemia   . Anxiety   .  Depression   . Stroke [facility])     Past Surgical History  Procedure Laterality Date  . Breast surgery  2011    biopsy  . Cervical discectomy    . Appendectomy    . Abdominal hysterectomy    . Cholecystectomy    . Sinusotomy     Family History:  Family History  Problem Relation Age of Onset  . Anxiety disorder Mother   . Depression Mother   . Cancer Father   . Gallbladder disease Father   . Alcohol abuse Father   . Depression Father    Social History:  Social History   Social History  . Marital Status: Single    Spouse Name: N/A  . Number of Children: N/A  . Years of Education: N/A   Social History Main Topics  . Smoking status: Former Smoker -- 1.00 packs/day for 35 years    Types: Cigarettes    Quit date: 01/29/2012  . Smokeless tobacco: Never Used  . Alcohol Use: No  . Drug Use: No  . Sexual Activity: No   Other Topics Concern  . None   Social History Narrative   Additional History:   Assessment:   Musculoskeletal: Strength & Muscle Tone: within normal limits Gait & Station: Slow Patient leans: N/A  Psychiatric Specialty Exam: Anxiety Symptoms include nervous/anxious behavior. Patient reports no suicidal ideas. Insomnia: Stated the mirtazapine did help her with insomnia.      Review of  Systems  Psychiatric/Behavioral: Positive for depression (he continues to have depression but does state the medications do help.) and substance abuse. Negative for suicidal ideas, hallucinations and memory loss. The patient is nervous/anxious. Insomnia: Stated the mirtazapine did help her with insomnia.     Blood pressure 118/72, pulse 120, temperature 98.8 F (37.1 C), temperature source Tympanic, weight 142 lb (64.411 kg), SpO2 94 %.Body mass index is 23.63 kg/(m^2).  General Appearance: Well Groomed  Eye Contact:  Good  Speech:  slow  Volume:  Decreased  Mood:  Anxious  Affect:  Congruent  Thought Process:  Linear  Orientation:  Full (Time, Place, and Person)   Thought Content:  Negative  Suicidal Thoughts:  No  Homicidal Thoughts:  No  Memory:  Immediate;   Good Recent;   Good Remote;   Good  Judgement:  Good  Insight:  Good  Psychomotor Activity:  Psychomotor Retardation  Concentration:  Good  Recall:  Good  Fund of Knowledge: Good  Language: Good  Akathisia:  Negative  Handed:  Right  AIMS (if indicated):  N/A  Assets:  Social Support  ADL's:  Intact  Cognition: WNL  Sleep: fair, improved with remeron   Is the patient at risk to self?  No. Has the patient been a risk to self in the past 6 months?  No. Has the patient been a risk to self within the distant past?  No. Is the patient a risk to others?  No. Has the patient been a risk to others in the past 6 months?  No. Has the patient been a risk to others within the distant past?  No.  Current Medications: Current Outpatient Prescriptions  Medication Sig Dispense Refill  . acetaminophen (TYLENOL) 325 MG tablet Take 2 tablets (650 mg total) by mouth every 6 (six) hours as needed for moderate pain. 15 tablet 0  . albuterol (VENTOLIN HFA) 108 (90 Base) MCG/ACT inhaler INHALE 2 PUFFS INTO THE LUNGS EVERY 4 HOURS AS NEEDED 18 g 0  . aspirin 81 MG tablet Take 1 tablet by mouth daily.    Marland Kitchen atorvastatin (LIPITOR) 40 MG tablet TAKE 1 TABLET(40 MG) BY MOUTH DAILY 30 tablet 1  . clonazePAM (KLONOPIN) 0.5 MG tablet Take 1 tablet (0.5 mg total) by mouth 2 (two) times daily. 30 tablet 0  . DULoxetine (CYMBALTA) 30 MG capsule Take 1 capsule (30 mg total) by mouth daily. 15 capsule 0  . fluticasone (FLONASE) 50 MCG/ACT nasal spray Place 2 sprays into both nostrils daily. 16 g 2  . Fluticasone-Salmeterol (ADVAIR DISKUS) 250-50 MCG/DOSE AEPB Inhale 1 puff into the lungs 2 (two) times daily. 60 each 2  . gabapentin (NEURONTIN) 300 MG capsule   4  . loratadine (CLARITIN) 10 MG tablet TAKE 1 TABLET(10 MG) BY MOUTH DAILY 30 tablet 5  . metoprolol succinate (TOPROL-XL) 25 MG 24 hr tablet Take 0.5  tablets (12.5 mg total) by mouth daily. 30 tablet 5  . mirtazapine (REMERON) 15 MG tablet Take 1 tablet (15 mg total) by mouth at bedtime. 30 tablet 1  . mirtazapine (REMERON) 15 MG tablet Take 1 tablet (15 mg total) by mouth at bedtime. 30 tablet 2  . morphine (MS CONTIN) 15 MG 12 hr tablet Take 15 mg by mouth every 12 (twelve) hours as needed.  0  . omeprazole (PRILOSEC) 20 MG capsule Take 1 capsule (20 mg total) by mouth daily. 90 capsule 1  . ranitidine (ZANTAC) 300 MG capsule Take 1 capsule (300 mg total)  by mouth every evening. 30 capsule 5  . tiotropium (SPIRIVA HANDIHALER) 18 MCG inhalation capsule Place 1 capsule (18 mcg total) into inhaler and inhale daily. 30 capsule 2   No current facility-administered medications for this visit.    Medical Decision Making:  Established Problem, Stable/Improving (1)  Treatment Plan Summary:Medication management and Plan   Majordepressive disorder, recurrent, moderate   Continue Cymbalta at 30 mg po daily  Continue Remeron 15 mg at bedtime mg at bedtime.   Generalized anxiety disorder.  Continue Clonazepam to 0.5mg  po twice a day. Patient will be given the 15 day supply at this time.With 2 refills.  More than 50% of the time spent in psychoeducation, counseling and coordination of care.    This note was generated in part or whole with voice recognition software. Voice regonition is usually quite accurate but there are transcription errors that can and very often do occur. I apologize for any typographical errors that were not detected and corrected.   Rainey Pines, MD  08/10/2015, 12:21 PM

## 2015-08-21 ENCOUNTER — Ambulatory Visit: Payer: Self-pay | Admitting: Family Medicine

## 2015-08-21 ENCOUNTER — Other Ambulatory Visit: Payer: Self-pay

## 2015-08-31 ENCOUNTER — Other Ambulatory Visit: Payer: Self-pay | Admitting: Psychiatry

## 2015-09-05 ENCOUNTER — Ambulatory Visit: Payer: Self-pay | Admitting: Family Medicine

## 2015-09-06 ENCOUNTER — Other Ambulatory Visit: Payer: Self-pay | Admitting: Family Medicine

## 2015-09-06 NOTE — Telephone Encounter (Signed)
Patient requesting refill of Albuterol

## 2015-09-08 ENCOUNTER — Other Ambulatory Visit: Payer: Self-pay | Admitting: Family Medicine

## 2015-09-08 NOTE — Telephone Encounter (Signed)
Not seen since March, needs to go to [facility] or [facility]

## 2015-09-12 NOTE — Telephone Encounter (Signed)
Tried to contact patient, not able to leave message.

## 2015-09-13 NOTE — Telephone Encounter (Signed)
Tried contacting patient yesterday and today. No answer and not able to leave message.

## 2015-09-13 NOTE — Telephone Encounter (Signed)
Voice mail never picked up to leave message to inform patient that she need to be seen

## 2015-09-14 ENCOUNTER — Ambulatory Visit (INDEPENDENT_AMBULATORY_CARE_PROVIDER_SITE_OTHER): Payer: PPO | Admitting: Licensed Clinical Social Worker

## 2015-09-14 DIAGNOSIS — F411 Generalized anxiety disorder: Secondary | ICD-10-CM | POA: Diagnosis not present

## 2015-09-27 NOTE — Progress Notes (Signed)
   THERAPIST PROGRESS NOTE  Session Time: 46min  Participation Level: Active  Behavioral Response: CasualAlertDepressed  Type of Therapy: Group Therapy  Treatment Goals addressed: Coping  Interventions: Solution Focused  Summary: BAANI ANGLES is a 70 y.o. female who presents with symptoms of her diagnosis.  Therapist and Patient exchanged introductions and engaged in discussion. Therapist shared confidentiality concepts with Patient to her understanding and answered all questions that she had. Therapist provided support for Patient as she explained her explanation for why she felt that she needed therapeutic services at this time.  Therapist provided active listening and feedback for Patient when necessary.  Therapist discussed community resources with Patient and provided her with pamphlet to read at her leisure.  Therapist discussed with Patient what makes her feel good inside and explored how to reduce negative thoughts?  Group discussion on support system and how that may or may not interfere with positive thoughts.  Ended the session with discussion of their favorite person and to act out their favorite person.   Suicidal/Homicidal: Nowithout intent/plan  Therapist Response: Solution focused interventions used to modifying behavioral pattern and understand thoughts guide her behavioral pattern.  Solution focused is displayed by the clinician seeking to learn what the client want out of life (their goals) and then help him achieve those goals.  Clinician actively listened, taught, and encouraged, while Patient's roles is to express concerns, learn, and implement that learning.  Plan: Return again in 3weeks.  Diagnosis: Axis I: Generalized Anxiety Disorder    Axis II: No diagnosis    Lubertha South, LCSW 09/14/2015

## 2015-09-29 ENCOUNTER — Encounter: Payer: Self-pay | Admitting: Family Medicine

## 2015-09-29 ENCOUNTER — Ambulatory Visit (INDEPENDENT_AMBULATORY_CARE_PROVIDER_SITE_OTHER): Payer: PPO | Admitting: Family Medicine

## 2015-09-29 VITALS — BP 124/74 | HR 100 | Temp 97.9°F | Resp 16 | Ht 65.0 in | Wt 142.0 lb

## 2015-09-29 DIAGNOSIS — I10 Essential (primary) hypertension: Secondary | ICD-10-CM

## 2015-09-29 DIAGNOSIS — Z1231 Encounter for screening mammogram for malignant neoplasm of breast: Secondary | ICD-10-CM | POA: Diagnosis not present

## 2015-09-29 DIAGNOSIS — G8929 Other chronic pain: Secondary | ICD-10-CM

## 2015-09-29 DIAGNOSIS — F339 Major depressive disorder, recurrent, unspecified: Secondary | ICD-10-CM | POA: Diagnosis not present

## 2015-09-29 DIAGNOSIS — R0789 Other chest pain: Secondary | ICD-10-CM | POA: Diagnosis not present

## 2015-09-29 DIAGNOSIS — Z23 Encounter for immunization: Secondary | ICD-10-CM

## 2015-09-29 DIAGNOSIS — J302 Other seasonal allergic rhinitis: Secondary | ICD-10-CM | POA: Diagnosis not present

## 2015-09-29 DIAGNOSIS — J449 Chronic obstructive pulmonary disease, unspecified: Secondary | ICD-10-CM

## 2015-09-29 DIAGNOSIS — D692 Other nonthrombocytopenic purpura: Secondary | ICD-10-CM | POA: Diagnosis not present

## 2015-09-29 DIAGNOSIS — S41112A Laceration without foreign body of left upper arm, initial encounter: Secondary | ICD-10-CM | POA: Diagnosis not present

## 2015-09-29 DIAGNOSIS — M545 Low back pain, unspecified: Secondary | ICD-10-CM

## 2015-09-29 DIAGNOSIS — E785 Hyperlipidemia, unspecified: Secondary | ICD-10-CM | POA: Diagnosis not present

## 2015-09-29 DIAGNOSIS — I69359 Hemiplegia and hemiparesis following cerebral infarction affecting unspecified side: Secondary | ICD-10-CM | POA: Diagnosis not present

## 2015-09-29 MED ORDER — IPRATROPIUM-ALBUTEROL 20-100 MCG/ACT IN AERS: 1.0000 | INHALATION_SPRAY | Freq: Four times a day (QID) | RESPIRATORY_TRACT | 0 refills | Status: DC

## 2015-09-29 MED ORDER — ATORVASTATIN CALCIUM 40 MG PO TABS: ORAL_TABLET | ORAL | 1 refills | Status: DC

## 2015-09-29 MED ORDER — FLUTICASONE PROPIONATE 50 MCG/ACT NA SUSP: 2.0000 | Freq: Every day | NASAL | 2 refills | Status: DC

## 2015-09-29 MED ORDER — TIOTROPIUM BROMIDE MONOHYDRATE 18 MCG IN CAPS: 18.0000 ug | ORAL_CAPSULE | Freq: Every day | RESPIRATORY_TRACT | 1 refills | Status: DC

## 2015-09-29 MED ORDER — FLUTICASONE FUROATE-VILANTEROL 100-25 MCG/INH IN AEPB: 1.0000 | INHALATION_SPRAY | Freq: Every day | RESPIRATORY_TRACT | 1 refills | Status: DC

## 2015-09-29 MED ORDER — METOPROLOL SUCCINATE ER 25 MG PO TB24: 12.5000 mg | ORAL_TABLET | Freq: Every day | ORAL | 1 refills | Status: DC

## 2015-09-29 MED ORDER — TIOTROPIUM BROMIDE MONOHYDRATE 18 MCG IN CAPS: 18.0000 ug | ORAL_CAPSULE | Freq: Every day | RESPIRATORY_TRACT | 2 refills | Status: DC

## 2015-09-29 MED ORDER — METOPROLOL SUCCINATE ER 25 MG PO TB24
12.5000 mg | ORAL_TABLET | Freq: Every day | ORAL | 5 refills | Status: DC
Start: 2015-09-29 — End: 2015-09-29

## 2015-09-29 MED ORDER — FLUTICASONE FUROATE-VILANTEROL 100-25 MCG/INH IN AEPB: 1.0000 | INHALATION_SPRAY | Freq: Every day | RESPIRATORY_TRACT | 2 refills | Status: DC
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
## 2015-10-09 ENCOUNTER — Other Ambulatory Visit: Payer: Self-pay | Admitting: Psychiatry

## 2015-10-11 NOTE — Telephone Encounter (Signed)
Please make appointment.

## 2015-11-01 ENCOUNTER — Encounter: Payer: Self-pay | Admitting: Psychiatry

## 2015-11-01 ENCOUNTER — Ambulatory Visit (INDEPENDENT_AMBULATORY_CARE_PROVIDER_SITE_OTHER): Payer: PPO | Admitting: Psychiatry

## 2015-11-01 VITALS — BP 141/70 | HR 68 | Temp 98.4°F | Wt 142.0 lb

## 2015-11-01 DIAGNOSIS — F1394 Sedative, hypnotic or anxiolytic use, unspecified with sedative, hypnotic or anxiolytic-induced mood disorder: Secondary | ICD-10-CM | POA: Diagnosis not present

## 2015-11-01 DIAGNOSIS — F331 Major depressive disorder, recurrent, moderate: Secondary | ICD-10-CM

## 2015-11-01 MED ORDER — MIRTAZAPINE 15 MG PO TABS: 15.0000 mg | ORAL_TABLET | Freq: Every day | ORAL | 2 refills | Status: DC

## 2015-11-01 MED ORDER — GABAPENTIN 300 MG PO CAPS: 300.0000 mg | ORAL_CAPSULE | Freq: Two times a day (BID) | ORAL | 4 refills | Status: DC

## 2015-11-01 MED ORDER — CLONAZEPAM 0.5 MG PO TABS: 0.5000 mg | ORAL_TABLET | Freq: Two times a day (BID) | ORAL | 2 refills | Status: DC

## 2015-11-01 MED ORDER — VORTIOXETINE HBR 10 MG PO TABS: 10.0000 mg | ORAL_TABLET | ORAL | 1 refills | Status: DC

## 2015-11-01 NOTE — Progress Notes (Signed)
Patient ID: Beth [name], female   DOB: July 04, 1945, 70 y.o.   MRN: [id number] [facility] MD/PA/NP OP Progress Note  11/01/2015 1:22 PM Beth [name]  MRN:  [id number]   Subjective: Patient is a 70 year old married female with history of major depression and generalized anxiety disorder presented for follow-up.She reported that she is feeling very anxious and needs a refill of her Klonopin. Patient reported that she has been feeling anxious from inside. She is also taking MS Contin as prescribed by her pain management doctor. Patient reported that she is interested in changing the Cymbalta at this time. Patient appeared apprehensive during the interview. Patient reported that she has been spending time at home. She currently denied having any suicidal ideations or plans. Patient reported that she takes Remeron at bedtime to help her sleep. She denied having any perceptual disturbances. She denied having any suicidal homicidal ideations or plans.  We have discussed about changing her to Trintillex at the last  appointment and she is interested in changing her medications at this time.      Chief Complaint:  Chief Complaint    Follow-up; Medication Refill     Visit Diagnosis:   No diagnosis found.  Past Medical History:  Past Medical History:  Diagnosis Date  . Allergy   . Anxiety   . COPD (chronic obstructive pulmonary disease) ([place])   . CVA (cerebral infarction)   . Depression   . Fibromyalgia   . Headache   . Hyperlipidemia   . Hypertension   . IBS (irritable bowel syndrome)   . Stroke ([place])   . Vitamin D deficiency     Past Surgical History:  Procedure Laterality Date  . ABDOMINAL HYSTERECTOMY    . APPENDECTOMY    . BREAST SURGERY  2011   biopsy  . CERVICAL DISCECTOMY    . CHOLECYSTECTOMY    . SINUSOTOMY     Family History:  Family History  Problem Relation Age of Onset  . Anxiety disorder Mother   . Depression Mother   . Cancer Father   . Gallbladder disease Father    . Alcohol abuse Father   . Depression Father    Social History:  Social History   Social History  . Marital status: Single    Spouse name: N/A  . Number of children: N/A  . Years of education: N/A   Social History Main Topics  . Smoking status: Former Smoker    Packs/day: 1.00    Years: 35.00    Types: Cigarettes    Quit date: 01/29/2012  . Smokeless tobacco: Never Used  . Alcohol use No  . Drug use: No  . Sexual activity: No   Other Topics Concern  . None   Social History Narrative  . None   Additional History:   Assessment:   Musculoskeletal: Strength & Muscle Tone: within normal limits Gait & Station: Slow Patient leans: N/A  Psychiatric Specialty Exam: Anxiety  Symptoms include nervous/anxious behavior. Patient reports no suicidal ideas. Insomnia: Stated the mirtazapine did help her with insomnia.    Medication Refill     Review of Systems  Psychiatric/Behavioral: Positive for depression (he continues to have depression but does state the medications do help.) and substance abuse. Negative for hallucinations, memory loss and suicidal ideas. The patient is nervous/anxious. Insomnia: Stated the mirtazapine did help her with insomnia.     Blood pressure (!) 141/70, pulse 68, temperature 98.4 F (36.9 C), temperature source Oral, weight 142 lb (64.4 kg).Body  mass index is 23.63 kg/m.  General Appearance: Well Groomed  Eye Contact:  Good  Speech:  slow  Volume:  Decreased  Mood:  Anxious  Affect:  Congruent  Thought Process:  Linear  Orientation:  Full (Time, Place, and Person)  Thought Content:  Negative  Suicidal Thoughts:  No  Homicidal Thoughts:  No  Memory:  Immediate;   Good Recent;   Good Remote;   Good  Judgement:  Good  Insight:  Good  Psychomotor Activity:  Psychomotor Retardation  Concentration:  Good  Recall:  Good  Fund of Knowledge: Good  Language: Good  Akathisia:  Negative  Handed:  Right  AIMS (if indicated):  N/A  Assets:   Social Support  ADL's:  Intact  Cognition: WNL  Sleep: fair, improved with remeron   Is the patient at risk to self?  No. Has the patient been a risk to self in the past 6 months?  No. Has the patient been a risk to self within the distant past?  No. Is the patient a risk to others?  No. Has the patient been a risk to others in the past 6 months?  No. Has the patient been a risk to others within the distant past?  No.  Current Medications: Current Outpatient Prescriptions  Medication Sig Dispense Refill  . aspirin 81 MG tablet Take 1 tablet by mouth daily.    Marland Kitchen atorvastatin (LIPITOR) 40 MG tablet TAKE 1 TABLET(40 MG) BY MOUTH DAILY 90 tablet 1  . clonazePAM (KLONOPIN) 0.5 MG tablet Take 1 tablet (0.5 mg total) by mouth 2 (two) times daily. 30 tablet 2  . DULoxetine (CYMBALTA) 30 MG capsule Take 1 capsule (30 mg total) by mouth daily. 30 capsule 0  . fluticasone (FLONASE) 50 MCG/ACT nasal spray Place 2 sprays into both nostrils daily. 16 g 2  . fluticasone furoate-vilanterol (BREO ELLIPTA) 100-25 MCG/INH AEPB Inhale 1 puff into the lungs daily. 120 each 1  . gabapentin (NEURONTIN) 300 MG capsule Take 1 capsule (300 mg total) by mouth 2 (two) times daily. 60 capsule 4  . Ipratropium-Albuterol (COMBIVENT RESPIMAT) 20-100 MCG/ACT AERS respimat Inhale 1 puff into the lungs every 6 (six) hours. 1 Inhaler 0  . loratadine (CLARITIN) 10 MG tablet TAKE 1 TABLET(10 MG) BY MOUTH DAILY 30 tablet 5  . metoprolol succinate (TOPROL-XL) 25 MG 24 hr tablet Take 0.5 tablets (12.5 mg total) by mouth daily. 90 tablet 1  . mirtazapine (REMERON) 15 MG tablet Take 1 tablet (15 mg total) by mouth at bedtime. 30 tablet 2  . morphine (MS CONTIN) 15 MG 12 hr tablet Take 15 mg by mouth every 12 (twelve) hours as needed.  0  . ranitidine (ZANTAC) 300 MG capsule Take 1 capsule (300 mg total) by mouth every evening. 30 capsule 5  . tiotropium (SPIRIVA HANDIHALER) 18 MCG inhalation capsule Place 1 capsule (18 mcg total)  into inhaler and inhale daily. 90 capsule 1   No current facility-administered medications for this visit.     Medical Decision Making:  Established Problem, Stable/Improving (1)  Treatment Plan Summary:Medication management and Plan    DC Cymbalta  I will start her on Trintillex  10 mg by mouth daily. Patient will also be given  prescription and samples of 2 weeks  Continue Remeron 15 mg at bedtime mg at bedtime.   Generalized anxiety disorder.  Continue Clonazepam to 0.5mg  po twice a day. Patient will be given the 15 day supply at this time.With 4  Refills. Follow-up in 2 months. She agreed with the plan.   More than 50% of the time spent in psychoeducation, counseling and coordination of care.    This note was generated in part or whole with voice recognition software. Voice regonition is usually quite accurate but there are transcription errors that can and very often do occur. I apologize for any typographical errors that were not detected and corrected.   Rainey Pines, MD  11/01/2015, 1:22 PM

## 2015-11-13 ENCOUNTER — Emergency Department
Admission: EM | Admit: 2015-11-13 | Discharge: 2015-11-13 | Disposition: A | Discharge status: Home / Self Care | Payer: Commercial Managed Care - HMO | Attending: Emergency Medicine | Admitting: Emergency Medicine

## 2015-11-13 ENCOUNTER — Encounter: Payer: Self-pay | Admitting: Emergency Medicine

## 2015-11-13 ENCOUNTER — Other Ambulatory Visit: Payer: Self-pay

## 2015-11-13 DIAGNOSIS — Z7982 Long term (current) use of aspirin: Secondary | ICD-10-CM | POA: Diagnosis not present

## 2015-11-13 DIAGNOSIS — R197 Diarrhea, unspecified: Secondary | ICD-10-CM | POA: Diagnosis not present

## 2015-11-13 DIAGNOSIS — J449 Chronic obstructive pulmonary disease, unspecified: Secondary | ICD-10-CM | POA: Insufficient documentation

## 2015-11-13 DIAGNOSIS — Z87891 Personal history of nicotine dependence: Secondary | ICD-10-CM | POA: Insufficient documentation

## 2015-11-13 DIAGNOSIS — R109 Unspecified abdominal pain: Secondary | ICD-10-CM | POA: Diagnosis not present

## 2015-11-13 DIAGNOSIS — I10 Essential (primary) hypertension: Secondary | ICD-10-CM | POA: Diagnosis not present

## 2015-11-13 DIAGNOSIS — R112 Nausea with vomiting, unspecified: Secondary | ICD-10-CM | POA: Diagnosis not present

## 2015-11-13 DIAGNOSIS — Z79899 Other long term (current) drug therapy: Secondary | ICD-10-CM | POA: Insufficient documentation

## 2015-11-13 LAB — COMPREHENSIVE METABOLIC PANEL
ALK PHOS: 101 U/L (ref 38–126)
ALT: 20 U/L (ref 14–54)
ANION GAP: 8 (ref 5–15)
AST: 29 U/L (ref 15–41)
Albumin: 3.8 g/dL (ref 3.5–5.0)
BILIRUBIN TOTAL: 0.8 mg/dL (ref 0.3–1.2)
BUN: 9 mg/dL (ref 6–20)
CALCIUM: 9.3 mg/dL (ref 8.9–10.3)
CO2: 25 mmol/L (ref 22–32)
Chloride: 109 mmol/L (ref 101–111)
Creatinine, Ser: 0.87 mg/dL (ref 0.44–1.00)
GFR calc non Af Amer: >60 mL/min (ref >60)
Glucose, Bld: 105 mg/dL — ABNORMAL HIGH (ref 65–99)
POTASSIUM: 3.5 mmol/L (ref 3.5–5.1)
SODIUM: 142 mmol/L (ref 135–145)
TOTAL PROTEIN: 7.2 g/dL (ref 6.5–8.1)

## 2015-11-13 LAB — URINALYSIS COMPLETE WITH MICROSCOPIC (ARMC ONLY)
Bilirubin Urine: NEGATIVE
Glucose, UA: NEGATIVE mg/dL
HGB URINE DIPSTICK: NEGATIVE
KETONES UR: NEGATIVE mg/dL
NITRITE: NEGATIVE
PH: 5 (ref 5.0–8.0)
PROTEIN: NEGATIVE mg/dL
SPECIFIC GRAVITY, URINE: 1.011 (ref 1.005–1.030)

## 2015-11-13 LAB — CBC
HCT: 43.7 % (ref 35.0–47.0)
HEMOGLOBIN: 15.1 g/dL (ref 12.0–16.0)
MCH: 30.6 pg (ref 26.0–34.0)
MCHC: 34.4 g/dL (ref 32.0–36.0)
MCV: 88.8 fL (ref 80.0–100.0)
Platelets: 245 10*3/uL (ref 150–440)
RBC: 4.93 MIL/uL (ref 3.80–5.20)
RDW: 14.7 % — ABNORMAL HIGH (ref 11.5–14.5)
WBC: 9.8 10*3/uL (ref 3.6–11.0)

## 2015-11-13 LAB — LIPASE, BLOOD: Lipase: 50 U/L (ref 11–51)

## 2015-11-13 MED ORDER — ONDANSETRON HCL 4 MG/2ML IJ SOLN
4.0000 mg | Freq: Once | INTRAMUSCULAR | Status: AC
  Administered 2015-11-13: 4 mg via INTRAVENOUS
  Filled 2015-11-13: qty 2

## 2015-11-13 MED ORDER — ONDANSETRON HCL 4 MG PO TABS: 4.0000 mg | ORAL_TABLET | Freq: Three times a day (TID) | ORAL | 0 refills | Status: DC | PRN

## 2015-11-13 NOTE — Discharge Instructions (Signed)
Please seek medical attention for any high fevers, chest pain, shortness of breath, change in behavior, persistent vomiting, bloody stool or any other new or concerning symptoms.  

## 2015-11-13 NOTE — ED Provider Notes (Signed)
[facility] Emergency Department Provider Note    ____________________________________________   I have reviewed the triage vital signs and the nursing notes.   HISTORY  Chief Complaint Emesis; Diarrhea; and Abdominal Pain   History limited by: Not Limited   HPI Beth [name] is a 70 y.o. female who presents to the emergency department because of concern for continued nausea. The patient states that she first started having problems with her digestive tract 4 days ago. She started having diarrhea and nausea. Three days ago she had vomiting, and has not had any since. She feels like the diarrhea has been improving. She thinks it might all be related to some barbeque she ate the night before it started. She has not had any measured fevers. Had not noticed any blood in her vomit or diarrhea.   Past Medical History:  Diagnosis Date  . Allergy   . Anxiety   . COPD (chronic obstructive pulmonary disease) ([place])   . CVA (cerebral infarction)   . Depression   . Fibromyalgia   . Headache   . Hyperlipidemia   . Hypertension   . IBS (irritable bowel syndrome)   . Stroke ([place])   . Vitamin D deficiency     Patient Active Problem List   Diagnosis Date Noted  . Seasonal allergic rhinitis 03/23/2015  . Hyperglycemia 03/23/2015  . Senile purpura ([place]) 03/23/2015  . Perennial allergic rhinitis with seasonal variation 03/23/2015  . Marital problems 10/31/2014  . Migraine without aura and without status migrainosus, not intractable 10/31/2014  . Chronic LBP 08/09/2014  . Colon polyp 08/09/2014  . COPD, severe ([place]) 08/09/2014  . CVA, old, hemiparesis ([place]) 08/09/2014  . Dyslipidemia 08/09/2014  . Dysfunction of eustachian tube 08/09/2014  . Fibromyalgia syndrome 08/09/2014  . Gastro-esophageal reflux disease without esophagitis 08/09/2014  . Benign migrating glossitis 08/09/2014  . Cerebrovascular accident, old 08/09/2014  . IBS (irritable bowel syndrome)  08/09/2014  . Low back pain with radiation 08/09/2014  . Chronic recurrent major depressive disorder ([place]) 08/09/2014  . Dysmetabolic syndrome [id number]  . OP (osteoporosis) 08/09/2014  . Vitamin D deficiency 08/09/2014  . Benign hypertension 07/19/2013  . Benign neoplasm of skin of trunk 06/03/2013  . H/O: pneumonia 09/25/2012    Past Surgical History:  Procedure Laterality Date  . ABDOMINAL HYSTERECTOMY    . APPENDECTOMY    . BREAST SURGERY  2011   biopsy  . CERVICAL DISCECTOMY    . CHOLECYSTECTOMY    . SINUSOTOMY      Prior to Admission medications   Medication Sig Start Date End Date Taking? Authorizing Provider  aspirin 81 MG tablet Take 1 tablet by mouth daily.    Historical Provider, MD  atorvastatin (LIPITOR) 40 MG tablet TAKE 1 TABLET(40 MG) BY MOUTH DAILY 09/29/15   Steele Sizer, MD  clonazePAM (KLONOPIN) 0.5 MG tablet Take 1 tablet (0.5 mg total) by mouth 2 (two) times daily. 11/01/15   Rainey Pines, MD  fluticasone (FLONASE) 50 MCG/ACT nasal spray Place 2 sprays into both nostrils daily. 09/29/15   Steele Sizer, MD  fluticasone furoate-vilanterol (BREO ELLIPTA) 100-25 MCG/INH AEPB Inhale 1 puff into the lungs daily. 09/29/15   Steele Sizer, MD  gabapentin (NEURONTIN) 300 MG capsule Take 1 capsule (300 mg total) by mouth 2 (two) times daily. 11/01/15   Rainey Pines, MD  Ipratropium-Albuterol (COMBIVENT RESPIMAT) 20-100 MCG/ACT AERS respimat Inhale 1 puff into the lungs every 6 (six) hours. 09/29/15   Steele Sizer, MD  loratadine (CLARITIN)  10 MG tablet TAKE 1 TABLET(10 MG) BY MOUTH DAILY 07/02/15   Steele Sizer, MD  metoprolol succinate (TOPROL-XL) 25 MG 24 hr tablet Take 0.5 tablets (12.5 mg total) by mouth daily. 09/29/15   Steele Sizer, MD  mirtazapine (REMERON) 15 MG tablet Take 1 tablet (15 mg total) by mouth at bedtime. 11/01/15   Rainey Pines, MD  morphine (MS CONTIN) 15 MG 12 hr tablet Take 15 mg by mouth every 12 (twelve) hours as needed. 04/06/15   Nathanial Rancher, MD   ranitidine (ZANTAC) 300 MG capsule Take 1 capsule (300 mg total) by mouth every evening. 03/23/15   Steele Sizer, MD  tiotropium (SPIRIVA HANDIHALER) 18 MCG inhalation capsule Place 1 capsule (18 mcg total) into inhaler and inhale daily. 09/29/15   Steele Sizer, MD  vortioxetine HBr (TRINTELLIX) 10 MG TABS Take 1 tablet (10 mg total) by mouth every morning. 11/01/15   Rainey Pines, MD    Allergies Bextra  [valdecoxib]; Compazine  [prochlorperazine edisylate]; Lithium carbonate; and Lyrica [pregabalin]  Family History  Problem Relation Age of Onset  . Anxiety disorder Mother   . Depression Mother   . Cancer Father   . Gallbladder disease Father   . Alcohol abuse Father   . Depression Father     Social History Social History  Substance Use Topics  . Smoking status: Former Smoker    Packs/day: 1.00    Years: 35.00    Types: Cigarettes    Quit date: 01/29/2012  . Smokeless tobacco: Never Used  . Alcohol use No    Review of Systems  Constitutional: Negative for fever. Cardiovascular: Negative for chest pain. Respiratory: Negative for shortness of breath. Gastrointestinal: Positive for nausea and vomiting, diarrhea.  Genitourinary: Negative for dysuria. Musculoskeletal: Negative for back pain. Skin: Negative for rash. Neurological: Negative for headaches, focal weakness or numbness.  10-point ROS otherwise negative.  ____________________________________________   PHYSICAL EXAM:  VITAL SIGNS: ED Triage Vitals  Enc Vitals Group     BP 11/13/15 1440 110/88     Pulse Rate 11/13/15 1440 78     Resp 11/13/15 1440 18     Temp 11/13/15 1440 97.9 F (36.6 C)     Temp Source 11/13/15 1440 Oral     SpO2 11/13/15 1440 98 %     Weight 11/13/15 1441 139 lb (63 kg)     Height 11/13/15 1441 5\' 4"  (1.626 m)     Head Circumference --      Peak Flow --      Pain Score 11/13/15 1441 8   Constitutional: Alert and oriented. Well appearing and in no distress. Eyes: Conjunctivae  are normal. Normal extraocular movements. ENT   Head: Normocephalic and atraumatic.   Nose: No congestion/rhinnorhea.   Mouth/Throat: Mucous membranes are moist.   Neck: No stridor. Hematological/Lymphatic/Immunilogical: No cervical lymphadenopathy. Cardiovascular: Normal rate, regular rhythm.  No murmurs, rubs, or gallops. Respiratory: Normal respiratory effort without tachypnea nor retractions. Breath sounds are clear and equal bilaterally. No wheezes/rales/rhonchi. Gastrointestinal: Soft and nontender. No distention.  Genitourinary: Deferred Musculoskeletal: Normal range of motion in all extremities. No lower extremity edema. Neurologic:  Normal speech and language. No gross focal neurologic deficits are appreciated.  Skin:  Skin is warm, dry and intact. No rash noted. Psychiatric: Mood and affect are normal. Speech and behavior are normal. Patient exhibits appropriate insight and judgment.  ____________________________________________    LABS (pertinent positives/negatives)  Labs Reviewed  COMPREHENSIVE METABOLIC PANEL - Abnormal; Notable for the following:  Result Value   Glucose, Bld 105 (*)    Total Bilirubin <0.1 (*)    All other components within normal limits  CBC - Abnormal; Notable for the following:    RDW 14.7 (*)    All other components within normal limits  URINALYSIS COMPLETEWITH MICROSCOPIC ([facility] ONLY) - Abnormal; Notable for the following:    Color, Urine YELLOW (*)    APPearance HAZY (*)    Leukocytes, UA 3+ (*)    Bacteria, UA RARE (*)    Squamous Epithelial / LPF 6-30 (*)    All other components within normal limits  URINE CULTURE  LIPASE, BLOOD     ____________________________________________   EKG  I, Nance Pear, attending physician, personally viewed and interpreted this EKG  EKG Time: 1449 Rate: 77 Rhythm: normal sinus rhythm Axis: normal Intervals: qtc 427 QRS: narrow ST changes: no st elevation Impression: left  trail enlargement, otherwise normal ekg   ____________________________________________    RADIOLOGY  None   ____________________________________________   PROCEDURES  Procedures  ____________________________________________   INITIAL IMPRESSION / ASSESSMENT AND PLAN / ED COURSE  Pertinent labs & imaging results that were available during my care of the patient were reviewed by me and considered in my medical decision making (see chart for details).  Patient with N/V/D, although symptoms have been improving. Blood work without any concerning findings. UA with some WBCs however patient without dysuria - will send for culture. Will plan on discharge with antiemetics.  ____________________________________________   FINAL CLINICAL IMPRESSION(S) / ED DIAGNOSES  Final diagnoses:  Nausea vomiting and diarrhea     Note: This dictation was prepared with Dragon dictation. Any transcriptional errors that result from this process are unintentional    Nance Pear, MD 11/13/15 1709

## 2015-11-13 NOTE — ED Triage Notes (Signed)
Patient presents to the ED with nausea, vomiting, diarrhea and abdominal pain since Thursday after eating barbecue from [place].  Patient appears pale and reports feeling tired and dizzy.

## 2015-11-15 LAB — URINE CULTURE

## 2015-11-20 DIAGNOSIS — G8929 Other chronic pain: Secondary | ICD-10-CM | POA: Diagnosis not present

## 2015-11-20 DIAGNOSIS — M5441 Lumbago with sciatica, right side: Secondary | ICD-10-CM | POA: Diagnosis not present

## 2015-11-20 DIAGNOSIS — M542 Cervicalgia: Secondary | ICD-10-CM | POA: Diagnosis not present

## 2015-11-20 DIAGNOSIS — F419 Anxiety disorder, unspecified: Secondary | ICD-10-CM | POA: Diagnosis not present

## 2015-11-20 DIAGNOSIS — M5030 Other cervical disc degeneration, unspecified cervical region: Secondary | ICD-10-CM | POA: Diagnosis not present

## 2015-11-20 DIAGNOSIS — M150 Primary generalized (osteo)arthritis: Secondary | ICD-10-CM | POA: Diagnosis not present

## 2015-11-20 DIAGNOSIS — M5137 Other intervertebral disc degeneration, lumbosacral region: Secondary | ICD-10-CM | POA: Diagnosis not present

## 2015-11-20 DIAGNOSIS — F1120 Opioid dependence, uncomplicated: Secondary | ICD-10-CM | POA: Diagnosis not present

## 2015-12-23 ENCOUNTER — Emergency Department
Admission: EM | Admit: 2015-12-23 | Discharge: 2015-12-23 | Disposition: A | Discharge status: Home / Self Care | Payer: Commercial Managed Care - HMO | Attending: Student in an Organized Health Care Education/Training Program | Admitting: Student in an Organized Health Care Education/Training Program

## 2015-12-23 ENCOUNTER — Encounter: Payer: Self-pay | Admitting: Emergency Medicine

## 2015-12-23 ENCOUNTER — Emergency Department: Payer: Commercial Managed Care - HMO

## 2015-12-23 DIAGNOSIS — I10 Essential (primary) hypertension: Secondary | ICD-10-CM | POA: Diagnosis not present

## 2015-12-23 DIAGNOSIS — Z87891 Personal history of nicotine dependence: Secondary | ICD-10-CM | POA: Insufficient documentation

## 2015-12-23 DIAGNOSIS — R079 Chest pain, unspecified: Secondary | ICD-10-CM | POA: Diagnosis not present

## 2015-12-23 DIAGNOSIS — J449 Chronic obstructive pulmonary disease, unspecified: Secondary | ICD-10-CM | POA: Insufficient documentation

## 2015-12-23 DIAGNOSIS — R1013 Epigastric pain: Secondary | ICD-10-CM | POA: Insufficient documentation

## 2015-12-23 DIAGNOSIS — Z7982 Long term (current) use of aspirin: Secondary | ICD-10-CM | POA: Diagnosis not present

## 2015-12-23 DIAGNOSIS — R112 Nausea with vomiting, unspecified: Secondary | ICD-10-CM | POA: Insufficient documentation

## 2015-12-23 DIAGNOSIS — R410 Disorientation, unspecified: Secondary | ICD-10-CM | POA: Diagnosis not present

## 2015-12-23 DIAGNOSIS — Z79899 Other long term (current) drug therapy: Secondary | ICD-10-CM | POA: Diagnosis not present

## 2015-12-23 DIAGNOSIS — K5730 Diverticulosis of large intestine without perforation or abscess without bleeding: Secondary | ICD-10-CM | POA: Diagnosis not present

## 2015-12-23 DIAGNOSIS — R110 Nausea: Secondary | ICD-10-CM

## 2015-12-23 LAB — CBC WITH DIFFERENTIAL/PLATELET
BASOS ABS: 0.1 10*3/uL (ref 0–0.1)
BASOS PCT: 1 %
EOS ABS: 0.2 10*3/uL (ref 0–0.7)
EOS PCT: 2 %
HCT: 43.4 % (ref 35.0–47.0)
Hemoglobin: 14.8 g/dL (ref 12.0–16.0)
Lymphocytes Relative: 30 %
Lymphs Abs: 3 10*3/uL (ref 1.0–3.6)
MCH: 30.7 pg (ref 26.0–34.0)
MCHC: 34.1 g/dL (ref 32.0–36.0)
MCV: 90 fL (ref 80.0–100.0)
Monocytes Absolute: 0.7 10*3/uL (ref 0.2–0.9)
Monocytes Relative: 7 %
Neutro Abs: 6 10*3/uL (ref 1.4–6.5)
Neutrophils Relative %: 60 %
PLATELETS: 231 10*3/uL (ref 150–440)
RBC: 4.82 MIL/uL (ref 3.80–5.20)
RDW: 14.9 % — ABNORMAL HIGH (ref 11.5–14.5)
WBC: 10 10*3/uL (ref 3.6–11.0)

## 2015-12-23 LAB — COMPREHENSIVE METABOLIC PANEL
ALBUMIN: 3.6 g/dL (ref 3.5–5.0)
ALT: 18 U/L (ref 14–54)
AST: 33 U/L (ref 15–41)
Alkaline Phosphatase: 98 U/L (ref 38–126)
Anion gap: 8 (ref 5–15)
BUN: 11 mg/dL (ref 6–20)
CHLORIDE: 107 mmol/L (ref 101–111)
CO2: 29 mmol/L (ref 22–32)
CREATININE: 0.79 mg/dL (ref 0.44–1.00)
Calcium: 9.3 mg/dL (ref 8.9–10.3)
GFR calc non Af Amer: >60 mL/min (ref >60)
Glucose, Bld: 104 mg/dL — ABNORMAL HIGH (ref 65–99)
Potassium: 3.2 mmol/L — ABNORMAL LOW (ref 3.5–5.1)
SODIUM: 144 mmol/L (ref 135–145)
Total Bilirubin: 0.7 mg/dL (ref 0.3–1.2)
Total Protein: 7 g/dL (ref 6.5–8.1)

## 2015-12-23 LAB — URINALYSIS COMPLETE WITH MICROSCOPIC (ARMC ONLY)
Glucose, UA: NEGATIVE mg/dL
Hgb urine dipstick: NEGATIVE
KETONES UR: NEGATIVE mg/dL
NITRITE: NEGATIVE
PH: 6 (ref 5.0–8.0)
PROTEIN: NEGATIVE mg/dL
SPECIFIC GRAVITY, URINE: 1.01 (ref 1.005–1.030)

## 2015-12-23 LAB — LIPASE, BLOOD: Lipase: 20 U/L (ref 11–51)

## 2015-12-23 LAB — TROPONIN I
Troponin I: <0.03 ng/mL (ref <0.03)
Troponin I: <0.03 ng/mL (ref <0.03)

## 2015-12-23 LAB — LACTIC ACID, PLASMA: Lactic Acid, Venous: 1.3 mmol/L (ref 0.5–1.9)

## 2015-12-23 MED ORDER — ONDANSETRON 4 MG PO TBDP: 4.0000 mg | ORAL_TABLET | Freq: Three times a day (TID) | ORAL | 0 refills | Status: DC | PRN

## 2015-12-23 MED ORDER — METOCLOPRAMIDE HCL 5 MG/ML IJ SOLN
INTRAMUSCULAR | Status: AC
  Administered 2015-12-23: 10 mg via INTRAVENOUS
  Filled 2015-12-23: qty 2

## 2015-12-23 MED ORDER — METOCLOPRAMIDE HCL 5 MG/5ML PO SOLN
10.0000 mg | Freq: Once | ORAL | Status: DC
  Filled 2015-12-23: qty 10

## 2015-12-23 MED ORDER — HALOPERIDOL LACTATE 5 MG/ML IJ SOLN
5.0000 mg | Freq: Once | INTRAMUSCULAR | Status: DC
  Filled 2015-12-23: qty 1

## 2015-12-23 MED ORDER — IOPAMIDOL (ISOVUE-300) INJECTION 61%
100.0000 mL | Freq: Once | INTRAVENOUS | Status: AC | PRN
  Administered 2015-12-23: 100 mL via INTRAVENOUS

## 2015-12-23 MED ORDER — FENTANYL CITRATE (PF) 100 MCG/2ML IJ SOLN
50.0000 ug | INTRAMUSCULAR | Status: DC | PRN
  Administered 2015-12-23: 50 ug via INTRAVENOUS
  Filled 2015-12-23: qty 2

## 2015-12-23 MED ORDER — IPRATROPIUM-ALBUTEROL 0.5-2.5 (3) MG/3ML IN SOLN
3.0000 mL | Freq: Once | RESPIRATORY_TRACT | Status: AC
  Administered 2015-12-23: 3 mL via RESPIRATORY_TRACT
  Filled 2015-12-23: qty 3

## 2015-12-23 MED ORDER — HALOPERIDOL LACTATE 5 MG/ML IJ SOLN
5.0000 mg | Freq: Once | INTRAMUSCULAR | Status: AC
  Administered 2015-12-23: 5 mg via INTRAVENOUS

## 2015-12-23 MED ORDER — LORAZEPAM 1 MG PO TABS
1.0000 mg | ORAL_TABLET | Freq: Once | ORAL | Status: AC
  Administered 2015-12-23: 1 mg via ORAL
  Filled 2015-12-23: qty 1

## 2015-12-23 MED ORDER — ALBUTEROL SULFATE (2.5 MG/3ML) 0.083% IN NEBU
5.0000 mg | INHALATION_SOLUTION | Freq: Once | RESPIRATORY_TRACT | Status: AC
  Administered 2015-12-23: 5 mg via RESPIRATORY_TRACT
  Filled 2015-12-23: qty 6

## 2015-12-23 MED ORDER — METOCLOPRAMIDE HCL 5 MG/ML IJ SOLN
10.0000 mg | Freq: Once | INTRAMUSCULAR | Status: AC
  Administered 2015-12-23: 10 mg via INTRAVENOUS

## 2015-12-23 NOTE — ED Provider Notes (Signed)
[facility] Emergency Department Provider Note    First MD Initiated Contact with Patient 12/23/15 1213     (approximate)  I have reviewed the triage vital signs and the nursing notes.   HISTORY  Chief Complaint Emesis    HPI Beth [name] is a 70 y.o. female presents with a chief complaint of nausea and vomiting over the past 2 weeks associated with body aches all over. Patient arrives very anxious and hyperventilating. Patient with poor historian. Son is at bedside and states that she has a history of anxiety who was recently decreased on her clonazepam. States that she has been having nonbilious nonbloody vomiting. Describing epigastric pain that started yesterday. Does have some associated chest pain and shortness of breath. No recent fevers. No diarrhea. No flank pain. Past Medical History:  Diagnosis Date  . Allergy   . Anxiety   . COPD (chronic obstructive pulmonary disease) ([place])   . CVA (cerebral infarction)   . Depression   . Fibromyalgia   . Headache   . Hyperlipidemia   . Hypertension   . IBS (irritable bowel syndrome)   . Stroke ([place])   . Vitamin D deficiency    Family History  Problem Relation Age of Onset  . Anxiety disorder Mother   . Depression Mother   . Cancer Father   . Gallbladder disease Father   . Alcohol abuse Father   . Depression Father    Past Surgical History:  Procedure Laterality Date  . ABDOMINAL HYSTERECTOMY    . APPENDECTOMY    . BREAST SURGERY  2011   biopsy  . CERVICAL DISCECTOMY    . CHOLECYSTECTOMY    . SINUSOTOMY     Patient Active Problem List   Diagnosis Date Noted  . Seasonal allergic rhinitis 03/23/2015  . Hyperglycemia 03/23/2015  . Senile purpura ([place]) 03/23/2015  . Perennial allergic rhinitis with seasonal variation 03/23/2015  . Marital problems 10/31/2014  . Migraine without aura and without status migrainosus, not intractable 10/31/2014  . Chronic LBP 08/09/2014  . Colon polyp  08/09/2014  . COPD, severe ([place]) 08/09/2014  . CVA, old, hemiparesis ([place]) 08/09/2014  . Dyslipidemia 08/09/2014  . Dysfunction of eustachian tube 08/09/2014  . Fibromyalgia syndrome 08/09/2014  . Gastro-esophageal reflux disease without esophagitis 08/09/2014  . Benign migrating glossitis 08/09/2014  . Cerebrovascular accident, old 08/09/2014  . IBS (irritable bowel syndrome) 08/09/2014  . Low back pain with radiation 08/09/2014  . Chronic recurrent major depressive disorder ([place]) 08/09/2014  . Dysmetabolic syndrome Q000111Q  . OP (osteoporosis) 08/09/2014  . Vitamin D deficiency 08/09/2014  . Benign hypertension 07/19/2013  . Benign neoplasm of skin of trunk 06/03/2013  . H/O: pneumonia 09/25/2012      Prior to Admission medications   Medication Sig Start Date End Date Taking? Authorizing Provider  aspirin 81 MG tablet Take 1 tablet by mouth daily.    Historical Provider, MD  atorvastatin (LIPITOR) 40 MG tablet TAKE 1 TABLET(40 MG) BY MOUTH DAILY 09/29/15   Steele Sizer, MD  clonazePAM (KLONOPIN) 0.5 MG tablet Take 1 tablet (0.5 mg total) by mouth 2 (two) times daily. 11/01/15   Rainey Pines, MD  fluticasone (FLONASE) 50 MCG/ACT nasal spray Place 2 sprays into both nostrils daily. 09/29/15   Steele Sizer, MD  fluticasone furoate-vilanterol (BREO ELLIPTA) 100-25 MCG/INH AEPB Inhale 1 puff into the lungs daily. 09/29/15   Steele Sizer, MD  gabapentin (NEURONTIN) 300 MG capsule Take 1 capsule (300 mg total) by  mouth 2 (two) times daily. 11/01/15   Rainey Pines, MD  Ipratropium-Albuterol (COMBIVENT RESPIMAT) 20-100 MCG/ACT AERS respimat Inhale 1 puff into the lungs every 6 (six) hours. 09/29/15   Steele Sizer, MD  loratadine (CLARITIN) 10 MG tablet TAKE 1 TABLET(10 MG) BY MOUTH DAILY 07/02/15   Steele Sizer, MD  metoprolol succinate (TOPROL-XL) 25 MG 24 hr tablet Take 0.5 tablets (12.5 mg total) by mouth daily. 09/29/15   Steele Sizer, MD  mirtazapine (REMERON) 15 MG tablet Take 1 tablet  (15 mg total) by mouth at bedtime. 11/01/15   Rainey Pines, MD  morphine (MS CONTIN) 15 MG 12 hr tablet Take 15 mg by mouth every 12 (twelve) hours as needed. 04/06/15   Nathanial Rancher, MD  ondansetron (ZOFRAN) 4 MG tablet Take 1 tablet (4 mg total) by mouth every 8 (eight) hours as needed. 11/13/15   Nance Pear, MD  ranitidine (ZANTAC) 300 MG capsule Take 1 capsule (300 mg total) by mouth every evening. 03/23/15   Steele Sizer, MD  tiotropium (SPIRIVA HANDIHALER) 18 MCG inhalation capsule Place 1 capsule (18 mcg total) into inhaler and inhale daily. 09/29/15   Steele Sizer, MD  vortioxetine HBr (TRINTELLIX) 10 MG TABS Take 1 tablet (10 mg total) by mouth every morning. 11/01/15   Rainey Pines, MD    Allergies Bextra  [valdecoxib]; Compazine  [prochlorperazine edisylate]; Lithium carbonate; and Lyrica [pregabalin]    Social History Social History  Substance Use Topics  . Smoking status: Former Smoker    Packs/day: 1.00    Years: 35.00    Types: Cigarettes    Quit date: 01/29/2012  . Smokeless tobacco: Never Used  . Alcohol use No    Review of Systems Patient denies headaches, rhinorrhea, blurry vision, numbness, shortness of breath, chest pain, edema, cough, abdominal pain, nausea, vomiting, diarrhea, dysuria, fevers, rashes or hallucinations unless otherwise stated above in HPI. ____________________________________________   PHYSICAL EXAM:  VITAL SIGNS: Vitals:   12/23/15 1156  BP: 111/89  Pulse: 88  Resp: (!) 30  Temp: 98.1 F (36.7 C)    Constitutional: Alert and oriented. Anxious appearing hyperventilating but speaking in complete sentences when asked. Eyes: Conjunctivae are normal. PERRL. EOMI. Head: Atraumatic. Nose: No congestion/rhinnorhea. Mouth/Throat: Mucous membranes are moist.  Oropharynx non-erythematous. Neck: No stridor. Painless ROM. No cervical spine tenderness to palpation Hematological/Lymphatic/Immunilogical: No cervical  lymphadenopathy. Cardiovascular: Normal rate, regular rhythm. Grossly normal heart sounds.  Good peripheral circulation. Respiratory: Tachypnea with diffuse expiratory muscles with faint expiratory wheezing but otherwise good air movement diffusely.. Gastrointestinal: Soft but with mild epigastric tenderness to palpation. No distention. No abdominal bruits. No CVA tenderness.  Musculoskeletal: No lower extremity tenderness nor edema.  No joint effusions. Neurologic:  Normal speech and language. No gross focal neurologic deficits are appreciated. No gait instability. Skin:  Skin is warm, dry and intact. No rash noted. Psychiatric: Very anxious appearing ____________________________________________   LABS (all labs ordered are listed, but only abnormal results are displayed)  Results for orders placed or performed during the hospital encounter of 12/23/15 (from the past 24 hour(s))  CBC with Differential     Status: Abnormal   Collection Time: 12/23/15 12:05 PM  Result Value Ref Range   WBC 10.0 3.6 - 11.0 K/uL   RBC 4.82 3.80 - 5.20 MIL/uL   Hemoglobin 14.8 12.0 - 16.0 g/dL   HCT 43.4 35.0 - 47.0 %   MCV 90.0 80.0 - 100.0 fL   MCH 30.7 26.0 - 34.0 pg   MCHC  34.1 32.0 - 36.0 g/dL   RDW 14.9 (H) 11.5 - 14.5 %   Platelets 231 150 - 440 K/uL   Neutrophils Relative % 60 %   Neutro Abs 6.0 1.4 - 6.5 K/uL   Lymphocytes Relative 30 %   Lymphs Abs 3.0 1.0 - 3.6 K/uL   Monocytes Relative 7 %   Monocytes Absolute 0.7 0.2 - 0.9 K/uL   Eosinophils Relative 2 %   Eosinophils Absolute 0.2 0 - 0.7 K/uL   Basophils Relative 1 %   Basophils Absolute 0.1 0 - 0.1 K/uL  Comprehensive metabolic panel     Status: Abnormal   Collection Time: 12/23/15 12:05 PM  Result Value Ref Range   Sodium 144 135 - 145 mmol/L   Potassium 3.2 (L) 3.5 - 5.1 mmol/L   Chloride 107 101 - 111 mmol/L   CO2 29 22 - 32 mmol/L   Glucose, Bld 104 (H) 65 - 99 mg/dL   BUN 11 6 - 20 mg/dL   Creatinine, Ser 0.79 0.44 - 1.00  mg/dL   Calcium 9.3 8.9 - 10.3 mg/dL   Total Protein 7.0 6.5 - 8.1 g/dL   Albumin 3.6 3.5 - 5.0 g/dL   AST 33 15 - 41 U/L   ALT 18 14 - 54 U/L   Alkaline Phosphatase 98 38 - 126 U/L   Total Bilirubin 0.7 0.3 - 1.2 mg/dL   GFR calc non Af Amer >60 >60 mL/min   GFR calc Af Amer >60 >60 mL/min   Anion gap 8 5 - 15  Lipase, blood     Status: None   Collection Time: 12/23/15 12:05 PM  Result Value Ref Range   Lipase 20 11 - 51 U/L  Troponin I     Status: None   Collection Time: 12/23/15 12:05 PM  Result Value Ref Range   Troponin I <0.03 <0.03 ng/mL  Lactic acid, plasma     Status: None   Collection Time: 12/23/15 12:05 PM  Result Value Ref Range   Lactic Acid, Venous 1.3 0.5 - 1.9 mmol/L  Urinalysis complete, with microscopic ([facility] only)     Status: Abnormal   Collection Time: 12/23/15 12:06 PM  Result Value Ref Range   Color, Urine YELLOW (A) YELLOW   APPearance HAZY (A) CLEAR   Glucose, UA NEGATIVE NEGATIVE mg/dL   Bilirubin Urine 1+ (A) NEGATIVE   Ketones, ur NEGATIVE NEGATIVE mg/dL   Specific Gravity, Urine 1.010 1.005 - 1.030   Hgb urine dipstick NEGATIVE NEGATIVE   pH 6.0 5.0 - 8.0   Protein, ur NEGATIVE NEGATIVE mg/dL   Nitrite NEGATIVE NEGATIVE   Leukocytes, UA 3+ (A) NEGATIVE   RBC / HPF 0-5 0 - 5 RBC/hpf   WBC, UA 6-30 0 - 5 WBC/hpf   Bacteria, UA RARE (A) NONE SEEN   Squamous Epithelial / LPF 6-30 (A) NONE SEEN   Mucous PRESENT    ____________________________________________  EKG My review and personal interpretation at Time: 12"01   Indication: sob  Rate: 80  Rhythm: sinus Axis: normal Other:  Non specific ST changes, normal intervals ____________________________________________  RADIOLOGY  I personally reviewed all radiographic images ordered to evaluate for the above acute complaints and reviewed radiology reports and findings.  These findings were personally discussed with the patient.  Please see medical record for radiology  report. ____________________________________________   PROCEDURES  Procedure(s) performed: none Procedures    Critical Care performed: no ____________________________________________   INITIAL IMPRESSION / ASSESSMENT AND PLAN /  ED COURSE  Pertinent labs & imaging results that were available during my care of the patient were reviewed by me and considered in my medical decision making (see chart for details).  DDX: copd, acs, gastritis, pacnreatitis, cholecystitis, myalgia, dehydration, withdrawal, panic attack  Beth [name] is a 70 y.o. who presents to the ED with above complaints. Patient arrives afebrile and hemodynamically stable with marked tachypnea but normal oxygen saturation. Very difficult to pin down exactly when symptoms started based on patient's history. Does have a component of underlying anxiety but patient also with multiple comorbidities and so has risk for true medical pathology. Will further evaluate with laboratory evaluation as well as radiographic imaging to evaluate for acute intra-abdominal or intrathoracic abnormality. We'll provide anxiolysis. Less consistent with acute withdrawal she has stable vitals.  The patient will be placed on continuous pulse oximetry and telemetry for monitoring.  Laboratory evaluation will be sent to evaluate for the above complaints.     Clinical Course as of Dec 23 1626  Sat Dec 23, 2015  1559 CT imaging unremarkable. Blood work otherwise reassuring. Patient no longer complaining of any abdominal pain. Requesting something additional for nausea. She is also requesting discharge home. Encouraged patient to stay for repeat troponin to further evaluate for any cardiac ischemia. Does not appear to have gotten any relief from nebulizer treatments. Based on normal blood work and normal CT do suspect that part of this is likely psychiatric in nature given her multiple visits for similar. We'll repeat troponin  [PR]    Clinical Course  User Index [PR] Merlyn Lot, MD   ----------------------------------------- 4:28 PM on 12/23/2015 -----------------------------------------  Currently awaiting repeat troponin. Patient signed out to Dr. Jacklynn Bue. Troponin negative do feel patient currently stable for discharge for outpatient follow-up.  Have discussed with the patient and available family all diagnostics and treatments performed thus far and all questions were answered to the best of my ability. The patient demonstrates understanding and agreement with plan.   ____________________________________________   FINAL CLINICAL IMPRESSION(S) / ED DIAGNOSES  Final diagnoses:  Acute epigastric pain  Chest pain, unspecified type  Nausea      NEW MEDICATIONS STARTED DURING THIS VISIT:  New Prescriptions   No medications on file     Note:  This document was prepared using Dragon voice recognition software and may include unintentional dictation errors.    Merlyn Lot, MD 12/23/15 [phone]

## 2015-12-23 NOTE — ED Provider Notes (Signed)
-----------------------------------------   5:44 PM on 12/23/2015 -----------------------------------------  Patient's repeat troponin is negative. Patient will be discharged home per Dr. Ruffin Frederick discharge instructions. I added a prescription for Zofran for any further nausea the patient might experience.   Harvest Dark, MD 12/23/15 [phone]

## 2015-12-23 NOTE — ED Notes (Signed)
Pt not keeping monitoring cords on.

## 2015-12-23 NOTE — ED Triage Notes (Signed)
Reports n/v x 2 weeks and bodyaches all over.  Pt hyperventalating at triage, instructed to slow breathing. Skin w/d.

## 2015-12-23 NOTE — ED Notes (Signed)
Patient transported to CT 

## 2015-12-29 ENCOUNTER — Encounter: Payer: Self-pay | Admitting: Family Medicine

## 2015-12-29 ENCOUNTER — Ambulatory Visit (INDEPENDENT_AMBULATORY_CARE_PROVIDER_SITE_OTHER): Payer: Commercial Managed Care - HMO | Admitting: Family Medicine

## 2015-12-29 VITALS — BP 132/68 | HR 102 | Temp 98.1°F | Resp 20 | Ht 64.75 in | Wt 131.5 lb

## 2015-12-29 DIAGNOSIS — R634 Abnormal weight loss: Secondary | ICD-10-CM

## 2015-12-29 DIAGNOSIS — J449 Chronic obstructive pulmonary disease, unspecified: Secondary | ICD-10-CM | POA: Diagnosis not present

## 2015-12-29 DIAGNOSIS — R000 Tachycardia, unspecified: Secondary | ICD-10-CM | POA: Diagnosis not present

## 2015-12-29 DIAGNOSIS — F411 Generalized anxiety disorder: Secondary | ICD-10-CM

## 2015-12-29 DIAGNOSIS — F339 Major depressive disorder, recurrent, unspecified: Secondary | ICD-10-CM | POA: Diagnosis not present

## 2015-12-29 DIAGNOSIS — I10 Essential (primary) hypertension: Secondary | ICD-10-CM | POA: Diagnosis not present

## 2015-12-29 DIAGNOSIS — E876 Hypokalemia: Secondary | ICD-10-CM

## 2015-12-29 DIAGNOSIS — D692 Other nonthrombocytopenic purpura: Secondary | ICD-10-CM | POA: Diagnosis not present

## 2015-12-29 DIAGNOSIS — I69359 Hemiplegia and hemiparesis following cerebral infarction affecting unspecified side: Secondary | ICD-10-CM | POA: Diagnosis not present

## 2015-12-29 MED ORDER — POTASSIUM CHLORIDE CRYS ER 20 MEQ PO TBCR: 20.0000 meq | EXTENDED_RELEASE_TABLET | Freq: Every day | ORAL | 0 refills | Status: DC

## 2015-12-29 MED ORDER — METOPROLOL SUCCINATE ER 25 MG PO TB24: 25.0000 mg | ORAL_TABLET | Freq: Every day | ORAL | 0 refills | Status: DC
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
## 2015-12-30 LAB — THYROID PANEL WITH TSH
Free Thyroxine Index: 1.9 (ref 1.4–3.8)
T3 Uptake: 29 % (ref 22–35)
T4, Total: 6.5 ug/dL (ref 4.5–12.0)
TSH: 3.09 mIU/L

## 2016-01-01 DIAGNOSIS — M542 Cervicalgia: Secondary | ICD-10-CM | POA: Diagnosis not present

## 2016-01-01 DIAGNOSIS — G8929 Other chronic pain: Secondary | ICD-10-CM | POA: Diagnosis not present

## 2016-01-01 DIAGNOSIS — M791 Myalgia: Secondary | ICD-10-CM | POA: Diagnosis not present

## 2016-01-01 DIAGNOSIS — F1120 Opioid dependence, uncomplicated: Secondary | ICD-10-CM | POA: Diagnosis not present

## 2016-01-01 DIAGNOSIS — M150 Primary generalized (osteo)arthritis: Secondary | ICD-10-CM | POA: Diagnosis not present

## 2016-01-01 DIAGNOSIS — M5030 Other cervical disc degeneration, unspecified cervical region: Secondary | ICD-10-CM | POA: Diagnosis not present

## 2016-01-01 DIAGNOSIS — M797 Fibromyalgia: Secondary | ICD-10-CM | POA: Diagnosis not present

## 2016-01-01 DIAGNOSIS — M5441 Lumbago with sciatica, right side: Secondary | ICD-10-CM | POA: Diagnosis not present

## 2016-01-01 DIAGNOSIS — M5137 Other intervertebral disc degeneration, lumbosacral region: Secondary | ICD-10-CM | POA: Diagnosis not present

## 2016-01-01 DIAGNOSIS — F419 Anxiety disorder, unspecified: Secondary | ICD-10-CM | POA: Diagnosis not present

## 2016-01-05 ENCOUNTER — Ambulatory Visit (INDEPENDENT_AMBULATORY_CARE_PROVIDER_SITE_OTHER): Payer: PPO | Admitting: Psychiatry

## 2016-01-05 ENCOUNTER — Encounter: Payer: Self-pay | Admitting: Psychiatry

## 2016-01-05 VITALS — BP 144/72 | HR 85 | Temp 98.0°F | Wt 132.6 lb

## 2016-01-05 DIAGNOSIS — F1394 Sedative, hypnotic or anxiolytic use, unspecified with sedative, hypnotic or anxiolytic-induced mood disorder: Secondary | ICD-10-CM

## 2016-01-05 DIAGNOSIS — F331 Major depressive disorder, recurrent, moderate: Secondary | ICD-10-CM

## 2016-01-05 DIAGNOSIS — F411 Generalized anxiety disorder: Secondary | ICD-10-CM

## 2016-01-05 MED ORDER — MIRTAZAPINE 15 MG PO TABS: 15.0000 mg | ORAL_TABLET | Freq: Every day | ORAL | 2 refills | Status: DC

## 2016-01-05 MED ORDER — CLONAZEPAM 0.5 MG PO TABS: 0.2500 mg | ORAL_TABLET | Freq: Two times a day (BID) | ORAL | 4 refills | Status: DC

## 2016-01-05 MED ORDER — GABAPENTIN 300 MG PO CAPS: 300.0000 mg | ORAL_CAPSULE | Freq: Two times a day (BID) | ORAL | 4 refills | Status: DC
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
## 2016-01-08 ENCOUNTER — Telehealth: Payer: Self-pay | Admitting: Psychiatry

## 2016-01-08 NOTE — Telephone Encounter (Signed)
I attempted to contact the daughter Derenda Mis at [phone].  Left message encouraging contact.

## 2016-01-08 NOTE — Telephone Encounter (Signed)
Get the number of her daughter and try to contact her

## 2016-01-17 ENCOUNTER — Ambulatory Visit: Payer: Self-pay | Admitting: Psychiatry

## 2016-01-18 ENCOUNTER — Ambulatory Visit: Payer: Self-pay | Admitting: Psychiatry

## 2016-01-19 ENCOUNTER — Ambulatory Visit: Payer: Self-pay | Admitting: Psychiatry

## 2016-02-09 ENCOUNTER — Emergency Department: Payer: Medicare HMO

## 2016-02-09 ENCOUNTER — Emergency Department
Admission: EM | Admit: 2016-02-09 | Discharge: 2016-02-09 | Disposition: A | Discharge status: Home / Self Care | Payer: Medicare HMO | Attending: Emergency Medicine | Admitting: Emergency Medicine

## 2016-02-09 DIAGNOSIS — Y929 Unspecified place or not applicable: Secondary | ICD-10-CM | POA: Diagnosis not present

## 2016-02-09 DIAGNOSIS — Z79899 Other long term (current) drug therapy: Secondary | ICD-10-CM | POA: Insufficient documentation

## 2016-02-09 DIAGNOSIS — Z7982 Long term (current) use of aspirin: Secondary | ICD-10-CM | POA: Diagnosis not present

## 2016-02-09 DIAGNOSIS — I10 Essential (primary) hypertension: Secondary | ICD-10-CM | POA: Diagnosis not present

## 2016-02-09 DIAGNOSIS — J449 Chronic obstructive pulmonary disease, unspecified: Secondary | ICD-10-CM | POA: Diagnosis not present

## 2016-02-09 DIAGNOSIS — S0990XA Unspecified injury of head, initial encounter: Secondary | ICD-10-CM | POA: Diagnosis not present

## 2016-02-09 DIAGNOSIS — W19XXXA Unspecified fall, initial encounter: Secondary | ICD-10-CM | POA: Diagnosis not present

## 2016-02-09 DIAGNOSIS — Y939 Activity, unspecified: Secondary | ICD-10-CM | POA: Insufficient documentation

## 2016-02-09 DIAGNOSIS — Z87891 Personal history of nicotine dependence: Secondary | ICD-10-CM | POA: Diagnosis not present

## 2016-02-09 DIAGNOSIS — R197 Diarrhea, unspecified: Secondary | ICD-10-CM | POA: Diagnosis not present

## 2016-02-09 DIAGNOSIS — Y999 Unspecified external cause status: Secondary | ICD-10-CM | POA: Diagnosis not present

## 2016-02-09 DIAGNOSIS — R112 Nausea with vomiting, unspecified: Secondary | ICD-10-CM | POA: Diagnosis not present

## 2016-02-09 LAB — COMPREHENSIVE METABOLIC PANEL
ALBUMIN: 3.8 g/dL (ref 3.5–5.0)
ALT: 18 U/L (ref 14–54)
AST: 29 U/L (ref 15–41)
Alkaline Phosphatase: 96 U/L (ref 38–126)
Anion gap: 8 (ref 5–15)
BUN: 25 mg/dL — AB (ref 6–20)
CHLORIDE: 107 mmol/L (ref 101–111)
CO2: 26 mmol/L (ref 22–32)
Calcium: 9.6 mg/dL (ref 8.9–10.3)
Creatinine, Ser: 1.2 mg/dL — ABNORMAL HIGH (ref 0.44–1.00)
GFR calc Af Amer: 51 mL/min — ABNORMAL LOW (ref >60)
GFR, EST NON AFRICAN AMERICAN: 44 mL/min — AB (ref >60)
GLUCOSE: 112 mg/dL — AB (ref 65–99)
POTASSIUM: 3.6 mmol/L (ref 3.5–5.1)
Sodium: 141 mmol/L (ref 135–145)
Total Bilirubin: 0.6 mg/dL (ref 0.3–1.2)
Total Protein: 7.6 g/dL (ref 6.5–8.1)

## 2016-02-09 LAB — CBC WITH DIFFERENTIAL/PLATELET
BASOS PCT: 0 %
Basophils Absolute: 0 10*3/uL (ref 0–0.1)
EOS ABS: 0.2 10*3/uL (ref 0–0.7)
EOS PCT: 2 %
HCT: 45 % (ref 35.0–47.0)
Hemoglobin: 15.2 g/dL (ref 12.0–16.0)
Lymphocytes Relative: 23 %
Lymphs Abs: 1.9 10*3/uL (ref 1.0–3.6)
MCH: 30.3 pg (ref 26.0–34.0)
MCHC: 33.9 g/dL (ref 32.0–36.0)
MCV: 89.3 fL (ref 80.0–100.0)
MONOS PCT: 11 %
Monocytes Absolute: 0.9 10*3/uL (ref 0.2–0.9)
NEUTROS PCT: 64 %
Neutro Abs: 5.2 10*3/uL (ref 1.4–6.5)
PLATELETS: 262 10*3/uL (ref 150–440)
RBC: 5.04 MIL/uL (ref 3.80–5.20)
RDW: 15.4 % — ABNORMAL HIGH (ref 11.5–14.5)
WBC: 8.1 10*3/uL (ref 3.6–11.0)

## 2016-02-09 LAB — GLUCOSE, CAPILLARY: GLUCOSE-CAPILLARY: 97 mg/dL (ref 65–99)

## 2016-02-09 LAB — TROPONIN I

## 2016-02-09 MED ORDER — SODIUM CHLORIDE 0.9 % IV SOLN
Freq: Once | INTRAVENOUS | Status: AC
  Administered 2016-02-09: 17:00:00 via INTRAVENOUS

## 2016-02-09 MED ORDER — ONDANSETRON HCL 4 MG/2ML IJ SOLN
4.0000 mg | Freq: Once | INTRAMUSCULAR | Status: AC
  Administered 2016-02-09: 4 mg via INTRAVENOUS
  Filled 2016-02-09: qty 2

## 2016-02-09 MED ORDER — SODIUM CHLORIDE 0.9 % IV SOLN
Freq: Once | INTRAVENOUS | Status: AC
  Administered 2016-02-09: 14:00:00 via INTRAVENOUS

## 2016-02-09 MED ORDER — ONDANSETRON 4 MG PO TBDP: 4.0000 mg | ORAL_TABLET | Freq: Three times a day (TID) | ORAL | 0 refills | Status: DC | PRN

## 2016-02-09 NOTE — Discharge Instructions (Signed)
Use the Zofran melt on your tongue wafers 1 wafer 3 times a day as needed for nausea. Drink small amounts of clear liquids tonight. Tomorrow try the Bratt diet bananas rice applesauce toast crackers etc. for breakfast. After that Regular food. Please return for fever or feeling worse and the inability to keep down fluids or getting sicker.  Please be sure to follow up with your regular doctor in the next week. Be sure he understands she had 3 visits to the emergency room in the last 3 months for nausea vomiting diarrhea.

## 2016-02-09 NOTE — ED Triage Notes (Signed)
Pt arrives via [facility] from [facility]  - pt with reports of nausea/vomiting and diarrhea for the last two days with increased weakness today  She reports a fall  - one on Wednesday and one fall on Thursday  Pt reports injury to her lip and neck after her fall   Tachycardic upon arrival

## 2016-02-09 NOTE — ED Provider Notes (Signed)
[facility] Emergency Department Provider Note   ____________________________________________   First MD Initiated Contact with Patient 02/09/16 1326     (approximate)  I have reviewed the triage vital signs and the nursing notes.   HISTORY  Chief Complaint Fatigue; Nausea; Emesis; and Diarrhea    HPI Beth [name] is a 71 y.o. female who reports she began having diarrhea Tuesday night and then began vomiting on Wednesday morning. She reports she passed out twice when she states it stood up. One time she hurt her lip and her neck. She complains of pain in both areas even in the posterior part of her neck. She is nauseated at present but is not having any more diarrhea or vomiting at present. She cannot tell me when the last time she threw up once. She cannot tell me if she got lightheaded when she stood up. She said she just stood up and passed out.   Past Medical History:  Diagnosis Date  . Allergy   . Anxiety   . COPD (chronic obstructive pulmonary disease) ([place])   . CVA (cerebral infarction)   . Depression   . Fibromyalgia   . Headache   . Hyperlipidemia   . Hypertension   . IBS (irritable bowel syndrome)   . Stroke ([place])   . Vitamin D deficiency     Patient Active Problem List   Diagnosis Date Noted  . Seasonal allergic rhinitis 03/23/2015  . Hyperglycemia 03/23/2015  . Senile purpura ([place]) 03/23/2015  . Perennial allergic rhinitis with seasonal variation 03/23/2015  . Marital problems 10/31/2014  . Migraine without aura and without status migrainosus, not intractable 10/31/2014  . Chronic LBP 08/09/2014  . Colon polyp 08/09/2014  . COPD, severe ([place]) 08/09/2014  . CVA, old, hemiparesis ([place]) 08/09/2014  . Dyslipidemia 08/09/2014  . Dysfunction of eustachian tube 08/09/2014  . Fibromyalgia syndrome 08/09/2014  . Gastro-esophageal reflux disease without esophagitis 08/09/2014  . Benign migrating glossitis 08/09/2014  .  Cerebrovascular accident, old 08/09/2014  . IBS (irritable bowel syndrome) 08/09/2014  . Low back pain with radiation 08/09/2014  . Chronic recurrent major depressive disorder ([place]) 08/09/2014  . Dysmetabolic syndrome [id number]  . OP (osteoporosis) 08/09/2014  . Vitamin D deficiency 08/09/2014  . Benign hypertension 07/19/2013  . Benign neoplasm of skin of trunk 06/03/2013  . H/O: pneumonia 09/25/2012    Past Surgical History:  Procedure Laterality Date  . ABDOMINAL HYSTERECTOMY    . APPENDECTOMY    . BREAST SURGERY  2011   biopsy  . CERVICAL DISCECTOMY    . CHOLECYSTECTOMY    . SINUSOTOMY      Prior to Admission medications   Medication Sig Start Date End Date Taking? Authorizing Provider  aspirin 81 MG tablet Take 1 tablet by mouth daily.    Historical Provider, MD  atorvastatin (LIPITOR) 40 MG tablet TAKE 1 TABLET(40 MG) BY MOUTH DAILY 09/29/15   Steele Sizer, MD  clonazePAM (KLONOPIN) 0.5 MG tablet Take 0.5 tablets (0.25 mg total) by mouth 2 (two) times daily. 01/05/16   Rainey Pines, MD  fluticasone (FLONASE) 50 MCG/ACT nasal spray Place 2 sprays into both nostrils daily. 09/29/15   Steele Sizer, MD  fluticasone furoate-vilanterol (BREO ELLIPTA) 100-25 MCG/INH AEPB Inhale 1 puff into the lungs daily. 09/29/15   Steele Sizer, MD  gabapentin (NEURONTIN) 300 MG capsule Take 1 capsule (300 mg total) by mouth 2 (two) times daily. 01/05/16   Rainey Pines, MD  Ipratropium-Albuterol (COMBIVENT RESPIMAT) 20-100 MCG/ACT AERS  respimat Inhale 1 puff into the lungs every 6 (six) hours. 09/29/15   Steele Sizer, MD  loratadine (CLARITIN) 10 MG tablet TAKE 1 TABLET(10 MG) BY MOUTH DAILY 07/02/15   Steele Sizer, MD  metoprolol succinate (TOPROL-XL) 25 MG 24 hr tablet Take 1 tablet (25 mg total) by mouth daily. 12/29/15   Steele Sizer, MD  mirtazapine (REMERON) 15 MG tablet Take 1 tablet (15 mg total) by mouth at bedtime. 01/05/16   Rainey Pines, MD  ondansetron (ZOFRAN ODT) 4 MG disintegrating  tablet Take 1 tablet (4 mg total) by mouth every 8 (eight) hours as needed for nausea or vomiting. 12/23/15   Harvest Dark, MD  ondansetron (ZOFRAN ODT) 4 MG disintegrating tablet Take 1 tablet (4 mg total) by mouth every 8 (eight) hours as needed for nausea or vomiting. 02/09/16   Nena Polio, MD  potassium chloride SA (K-DUR,KLOR-CON) 20 MEQ tablet Take 1 tablet (20 mEq total) by mouth daily. 12/29/15   Steele Sizer, MD  ranitidine (ZANTAC) 300 MG capsule Take 1 capsule (300 mg total) by mouth every evening. 03/23/15   Steele Sizer, MD  tiotropium (SPIRIVA HANDIHALER) 18 MCG inhalation capsule Place 1 capsule (18 mcg total) into inhaler and inhale daily. 09/29/15   Steele Sizer, MD    Allergies Bextra  [valdecoxib]; Compazine  [prochlorperazine edisylate]; Lithium carbonate; and Lyrica [pregabalin]  Family History  Problem Relation Age of Onset  . Anxiety disorder Mother   . Depression Mother   . Cancer Father   . Gallbladder disease Father   . Alcohol abuse Father   . Depression Father     Social History Social History  Substance Use Topics  . Smoking status: Former Smoker    Packs/day: 1.00    Years: 35.00    Types: Cigarettes    Quit date: 01/29/2012  . Smokeless tobacco: Never Used  . Alcohol use No    Review of Systems Constitutional: No fever/chills Eyes: No visual changes. ENT: No sore throat. Cardiovascular: Denies chest pain. Respiratory: Denies shortness of breath. Gastrointestinal: No abdominal pain.  See history of present illness Genitourinary: Negative for dysuria. Musculoskeletal: Negative for back pain. Skin: Negative for rash. Neurological: Negative for headaches, focal weakness or numbness.  10-point ROS otherwise negative.  ____________________________________________   PHYSICAL EXAM:  VITAL SIGNS: ED Triage Vitals  Enc Vitals Group     BP 02/09/16 1330 118/68     Pulse Rate 02/09/16 1330 (!) 102     Resp 02/09/16 1330 15     Temp  02/09/16 1330 97.8 F (36.6 C)     Temp Source 02/09/16 1330 Oral     SpO2 02/09/16 1330 92 %     Weight 02/09/16 1411 135 lb (61.2 kg)     Height 02/09/16 1411 5\' 5"  (1.651 m)     Head Circumference --      Peak Flow --      Pain Score 02/09/16 1330 5     Pain Loc --      Pain Edu? --      Excl. in [place]? --     Constitutional: Alert and oriented.Looks tired but in no distress Eyes: Conjunctivae are normal. PERRL. EOMI. Head: Atraumatic. Nose: No congestion/rhinnorhea. Mouth/Throat: Mucous membranes are moist.  Oropharynx non-erythematous. Neck: No stridor Cardiovascular: Normal rate, regular rhythm. Grossly normal heart sounds.  Good peripheral circulation. Respiratory: Normal respiratory effort.  No retractions. Lungs CTAB. Gastrointestinal: Soft and nontender. No distention. No abdominal bruits. No CVA tenderness. Musculoskeletal:  No lower extremity tenderness nor edema.  No joint effusions.   ____________________________________________   LABS (all labs ordered are listed, but only abnormal results are displayed)  Labs Reviewed  COMPREHENSIVE METABOLIC PANEL - Abnormal; Notable for the following:       Result Value   Glucose, Bld 112 (*)    BUN 25 (*)    Creatinine, Ser 1.20 (*)    GFR calc non Af Amer 44 (*)    GFR calc Af Amer 51 (*)    All other components within normal limits  CBC WITH DIFFERENTIAL/PLATELET - Abnormal; Notable for the following:    RDW 15.4 (*)    All other components within normal limits  TROPONIN I  GLUCOSE, CAPILLARY  URINALYSIS, COMPLETE (UACMP) WITH MICROSCOPIC  CBG MONITORING, ED   ____________________________________________  EKG  EKG read and interpreted by me shows sinus tachycardia rate of 105 there is slight ST segment depression in multiple leads ____________________________________________  RADIOLOGY  Study Result   CLINICAL DATA:  71 year old female with history of trauma from multiple falls over the past week with  injury to the head. One fall 2 days ago resulted in loss of consciousness. Some associated nausea, vomiting and dizziness. Patient is on blood thinners. Neck pain.  EXAM: CT HEAD WITHOUT CONTRAST  CT CERVICAL SPINE WITHOUT CONTRAST  TECHNIQUE: Multidetector CT imaging of the head and cervical spine was performed following the standard protocol without intravenous contrast. Multiplanar CT image reconstructions of the cervical spine were also generated.  COMPARISON:  Head CT 12/17/2012.  FINDINGS: CT HEAD FINDINGS  Brain: Mild cerebral atrophy. Patchy and confluent areas of decreased attenuation are noted throughout the deep and periventricular white matter of the cerebral hemispheres bilaterally, compatible with chronic microvascular ischemic disease. Old lacunar infarct in the body of the right caudate nucleus is unchanged. No evidence of acute infarction, hemorrhage, hydrocephalus, extra-axial collection or mass lesion/mass effect.  Vascular: No hyperdense vessel or unexpected calcification.  Skull: Normal. Negative for fracture or focal lesion.  Sinuses/Orbits: No acute finding.  Other: None.  CT CERVICAL SPINE FINDINGS  Alignment: Normal.  Skull base and vertebrae: Postoperative changes of ACDF are noted from C5-C7 with incorporated interbody grafts at C5-C6 and C6-C7. No acute fracture. No primary bone lesion or focal pathologic process.  Soft tissues and spinal canal: No prevertebral fluid or swelling. No visible canal hematoma.  Disc levels: Mild multilevel degenerative disc disease and mild multilevel facet arthropathy.  Upper chest: Mild emphysematous changes are noted in the lung apices.  Other: None.  IMPRESSION: 1. No evidence of significant acute traumatic injury to the skull, brain or cervical spine. 2. Mild cerebral atrophy with mild chronic microvascular ischemic changes in the cerebral white matter redemonstrated, as  above. 3. Postoperative changes of the ACDF at C5-C7, without acute complications.   Electronically Signed   By: Vinnie Langton M.D.   On: 02/09/2016 14:05     ____________________________________________   PROCEDURES  Procedure(s) performed:   Procedures  Critical Care performed:   ____________________________________________   INITIAL IMPRESSION / ASSESSMENT AND PLAN / ED COURSE  Pertinent labs & imaging results that were available during my care of the patient were reviewed by me and considered in my medical decision making (see chart for details).    Clinical Course    Reviewed EKGs with Dr. Marlene Lard cardiology on-call. He noticed there is a difference but patient is improving with fluids feels a difference is not significant.  ____________________________________________   FINAL CLINICAL  IMPRESSION(S) / ED DIAGNOSES  Final diagnoses:  Nausea vomiting and diarrhea      NEW MEDICATIONS STARTED DURING THIS VISIT:  Discharge Medication List as of 02/09/2016  6:25 PM    START taking these medications   Details  !! ondansetron (ZOFRAN ODT) 4 MG disintegrating tablet Take 1 tablet (4 mg total) by mouth every 8 (eight) hours as needed for nausea or vomiting., Starting Fri 02/09/2016, Print     !! - Potential duplicate medications found. Please discuss with provider.       Note:  This document was prepared using Dragon voice recognition software and may include unintentional dictation errors.    Nena Polio, MD 02/09/16 2036

## 2016-02-13 ENCOUNTER — Encounter: Payer: Self-pay | Admitting: Family Medicine

## 2016-02-13 ENCOUNTER — Encounter: Payer: Self-pay | Admitting: Emergency Medicine

## 2016-02-13 ENCOUNTER — Emergency Department
Admission: EM | Admit: 2016-02-13 | Discharge: 2016-02-13 | Disposition: A | Discharge status: Home / Self Care | Payer: Medicare HMO | Attending: Student in an Organized Health Care Education/Training Program | Admitting: Student in an Organized Health Care Education/Training Program

## 2016-02-13 ENCOUNTER — Emergency Department: Payer: Medicare HMO

## 2016-02-13 ENCOUNTER — Ambulatory Visit (INDEPENDENT_AMBULATORY_CARE_PROVIDER_SITE_OTHER): Payer: Medicare HMO | Admitting: Family Medicine

## 2016-02-13 VITALS — BP 100/64 | HR 83 | Temp 97.8°F | Resp 18 | Ht 65.0 in | Wt 133.0 lb

## 2016-02-13 DIAGNOSIS — R110 Nausea: Secondary | ICD-10-CM | POA: Diagnosis not present

## 2016-02-13 DIAGNOSIS — I10 Essential (primary) hypertension: Secondary | ICD-10-CM | POA: Diagnosis not present

## 2016-02-13 DIAGNOSIS — Z87891 Personal history of nicotine dependence: Secondary | ICD-10-CM | POA: Insufficient documentation

## 2016-02-13 DIAGNOSIS — E860 Dehydration: Secondary | ICD-10-CM | POA: Diagnosis not present

## 2016-02-13 DIAGNOSIS — R42 Dizziness and giddiness: Secondary | ICD-10-CM | POA: Diagnosis not present

## 2016-02-13 DIAGNOSIS — K529 Noninfective gastroenteritis and colitis, unspecified: Secondary | ICD-10-CM | POA: Diagnosis not present

## 2016-02-13 DIAGNOSIS — Z7982 Long term (current) use of aspirin: Secondary | ICD-10-CM | POA: Diagnosis not present

## 2016-02-13 DIAGNOSIS — R1013 Epigastric pain: Secondary | ICD-10-CM | POA: Diagnosis not present

## 2016-02-13 DIAGNOSIS — J449 Chronic obstructive pulmonary disease, unspecified: Secondary | ICD-10-CM | POA: Diagnosis not present

## 2016-02-13 DIAGNOSIS — R112 Nausea with vomiting, unspecified: Secondary | ICD-10-CM | POA: Insufficient documentation

## 2016-02-13 DIAGNOSIS — R197 Diarrhea, unspecified: Secondary | ICD-10-CM | POA: Insufficient documentation

## 2016-02-13 DIAGNOSIS — J45909 Unspecified asthma, uncomplicated: Secondary | ICD-10-CM | POA: Diagnosis not present

## 2016-02-13 DIAGNOSIS — Z79899 Other long term (current) drug therapy: Secondary | ICD-10-CM | POA: Insufficient documentation

## 2016-02-13 DIAGNOSIS — R1032 Left lower quadrant pain: Secondary | ICD-10-CM | POA: Diagnosis not present

## 2016-02-13 HISTORY — DX: Unspecified asthma, uncomplicated: J45.909

## 2016-02-13 LAB — COMPREHENSIVE METABOLIC PANEL
ALT: 16 U/L (ref 14–54)
ANION GAP: 6 (ref 5–15)
AST: 24 U/L (ref 15–41)
Albumin: 3.5 g/dL (ref 3.5–5.0)
Alkaline Phosphatase: 72 U/L (ref 38–126)
BUN: 16 mg/dL (ref 6–20)
CALCIUM: 9.5 mg/dL (ref 8.9–10.3)
CHLORIDE: 110 mmol/L (ref 101–111)
CO2: 25 mmol/L (ref 22–32)
Creatinine, Ser: 0.98 mg/dL (ref 0.44–1.00)
GFR calc non Af Amer: 56 mL/min — ABNORMAL LOW (ref >60)
Glucose, Bld: 108 mg/dL — ABNORMAL HIGH (ref 65–99)
POTASSIUM: 4 mmol/L (ref 3.5–5.1)
Sodium: 141 mmol/L (ref 135–145)
Total Bilirubin: 0.9 mg/dL (ref 0.3–1.2)
Total Protein: 6.6 g/dL (ref 6.5–8.1)

## 2016-02-13 LAB — CBC
HCT: 40.9 % (ref 35.0–47.0)
HEMOGLOBIN: 13.9 g/dL (ref 12.0–16.0)
MCH: 30.7 pg (ref 26.0–34.0)
MCHC: 34.1 g/dL (ref 32.0–36.0)
MCV: 89.9 fL (ref 80.0–100.0)
Platelets: 231 10*3/uL (ref 150–440)
RBC: 4.55 MIL/uL (ref 3.80–5.20)
RDW: 15 % — ABNORMAL HIGH (ref 11.5–14.5)
WBC: 9.5 10*3/uL (ref 3.6–11.0)

## 2016-02-13 LAB — URINALYSIS, COMPLETE (UACMP) WITH MICROSCOPIC
Bacteria, UA: NONE SEEN
Bilirubin Urine: NEGATIVE
Glucose, UA: NEGATIVE mg/dL
HGB URINE DIPSTICK: NEGATIVE
KETONES UR: 20 mg/dL — AB
Leukocytes, UA: NEGATIVE
Nitrite: NEGATIVE
PROTEIN: NEGATIVE mg/dL
Specific Gravity, Urine: 1.016 (ref 1.005–1.030)
pH: 5 (ref 5.0–8.0)

## 2016-02-13 LAB — TROPONIN I: Troponin I: <0.03 ng/mL (ref <0.03)

## 2016-02-13 LAB — LIPASE, BLOOD: LIPASE: 27 U/L (ref 11–51)

## 2016-02-13 MED ORDER — LORAZEPAM 0.5 MG PO TABS
0.5000 mg | ORAL_TABLET | Freq: Once | ORAL | Status: AC
  Administered 2016-02-13: 0.5 mg via ORAL
  Filled 2016-02-13: qty 1

## 2016-02-13 MED ORDER — SODIUM CHLORIDE 0.9 % IV BOLUS (SEPSIS)
1000.0000 mL | Freq: Once | INTRAVENOUS | Status: AC
  Administered 2016-02-13: 1000 mL via INTRAVENOUS

## 2016-02-13 MED ORDER — PROMETHAZINE HCL 25 MG/ML IJ SOLN
12.5000 mg | Freq: Once | INTRAMUSCULAR | Status: AC
  Administered 2016-02-13: 12.5 mg via INTRAVENOUS
  Filled 2016-02-13: qty 1

## 2016-02-13 MED ORDER — IOPAMIDOL (ISOVUE-300) INJECTION 61%
100.0000 mL | Freq: Once | INTRAVENOUS | Status: AC | PRN
  Administered 2016-02-13: 100 mL via INTRAVENOUS

## 2016-02-13 MED ORDER — PROMETHAZINE HCL 12.5 MG PO TABS: 12.5000 mg | ORAL_TABLET | Freq: Four times a day (QID) | ORAL | 0 refills | Status: DC | PRN

## 2016-02-13 NOTE — Discharge Instructions (Signed)

## 2016-02-13 NOTE — ED Provider Notes (Signed)
[facility] Emergency Department Provider Note    First MD Initiated Contact with Patient 02/13/16 1510     (approximate)  I have reviewed the triage vital signs and the nursing notes.   HISTORY  Chief Complaint Abdominal Pain    HPI Beth [name] is a 71 y.o. female presents with persistent nausea vomiting diarrhea and lightheadedness. Patient was recently diagnosed with normal iris earlier this week. States that since being discharged she is not had any improvement in symptoms. States she is also having vague abdominal pain that is primarily in the epigastric area with radiation to the left lower quadrant. States the pain is aching. No blood in her stools. No measured fevers but states she has had chills. States that she feels like she was "dying".  She denies any chest pain or pressure.   Past Medical History:  Diagnosis Date  . Allergy   . Anxiety   . Asthma   . COPD (chronic obstructive pulmonary disease) ([place])   . CVA (cerebral infarction)   . Depression   . Fibromyalgia   . Headache   . Hyperlipidemia   . Hypertension   . IBS (irritable bowel syndrome)   . Stroke ([place])   . Vitamin D deficiency    Family History  Problem Relation Age of Onset  . Anxiety disorder Mother   . Depression Mother   . Cancer Father   . Gallbladder disease Father   . Alcohol abuse Father   . Depression Father    Past Surgical History:  Procedure Laterality Date  . ABDOMINAL HYSTERECTOMY    . APPENDECTOMY    . BREAST SURGERY  2011   biopsy  . CERVICAL DISCECTOMY    . CHOLECYSTECTOMY    . SINUSOTOMY     Patient Active Problem List   Diagnosis Date Noted  . Seasonal allergic rhinitis 03/23/2015  . Hyperglycemia 03/23/2015  . Senile purpura ([place]) 03/23/2015  . Perennial allergic rhinitis with seasonal variation 03/23/2015  . Marital problems 10/31/2014  . Migraine without aura and without status migrainosus, not intractable 10/31/2014  . Chronic  LBP 08/09/2014  . Colon polyp 08/09/2014  . COPD, severe ([place]) 08/09/2014  . CVA, old, hemiparesis ([place]) 08/09/2014  . Dyslipidemia 08/09/2014  . Dysfunction of eustachian tube 08/09/2014  . Fibromyalgia syndrome 08/09/2014  . Gastro-esophageal reflux disease without esophagitis 08/09/2014  . Benign migrating glossitis 08/09/2014  . Cerebrovascular accident, old 08/09/2014  . IBS (irritable bowel syndrome) 08/09/2014  . Low back pain with radiation 08/09/2014  . Chronic recurrent major depressive disorder ([place]) 08/09/2014  . Dysmetabolic syndrome Q000111Q  . OP (osteoporosis) 08/09/2014  . Vitamin D deficiency 08/09/2014  . Benign hypertension 07/19/2013  . Benign neoplasm of skin of trunk 06/03/2013  . H/O: pneumonia 09/25/2012      Prior to Admission medications   Medication Sig Start Date End Date Taking? Authorizing Provider  aspirin 81 MG tablet Take 1 tablet by mouth daily.   Yes Historical Provider, MD  clonazePAM (KLONOPIN) 0.5 MG tablet Take 0.5 tablets (0.25 mg total) by mouth 2 (two) times daily. 01/05/16  Yes Rainey Pines, MD  fluticasone (FLONASE) 50 MCG/ACT nasal spray Place 2 sprays into both nostrils daily. 09/29/15  Yes Steele Sizer, MD  fluticasone furoate-vilanterol (BREO ELLIPTA) 100-25 MCG/INH AEPB Inhale 1 puff into the lungs daily. 09/29/15  Yes Steele Sizer, MD  gabapentin (NEURONTIN) 300 MG capsule Take 1 capsule (300 mg total) by mouth 2 (two) times daily. 01/05/16  Yes Rainey Pines, MD  Ipratropium-Albuterol (COMBIVENT RESPIMAT) 20-100 MCG/ACT AERS respimat Inhale 1 puff into the lungs every 6 (six) hours. 09/29/15  Yes Steele Sizer, MD  metoprolol succinate (TOPROL-XL) 25 MG 24 hr tablet Take 1 tablet (25 mg total) by mouth daily. 12/29/15  Yes Steele Sizer, MD  mirtazapine (REMERON) 15 MG tablet Take 1 tablet (15 mg total) by mouth at bedtime. 01/05/16  Yes Rainey Pines, MD  morphine (MS CONTIN) 15 MG 12 hr tablet Take 1 tablet by mouth 2 (two) times daily.  02/10/16  Yes Nathanial Rancher, MD  ondansetron (ZOFRAN ODT) 4 MG disintegrating tablet Take 1 tablet (4 mg total) by mouth every 8 (eight) hours as needed for nausea or vomiting. 02/09/16  Yes Nena Polio, MD  potassium chloride SA (K-DUR,KLOR-CON) 20 MEQ tablet Take 1 tablet (20 mEq total) by mouth daily. 12/29/15  Yes Steele Sizer, MD  tiotropium (SPIRIVA HANDIHALER) 18 MCG inhalation capsule Place 1 capsule (18 mcg total) into inhaler and inhale daily. 09/29/15  Yes Steele Sizer, MD  atorvastatin (LIPITOR) 40 MG tablet TAKE 1 TABLET(40 MG) BY MOUTH DAILY Patient not taking: Reported on 02/13/2016 09/29/15   Steele Sizer, MD  loratadine (CLARITIN) 10 MG tablet TAKE 1 TABLET(10 MG) BY MOUTH DAILY Patient not taking: Reported on 02/13/2016 07/02/15   Steele Sizer, MD  promethazine (PHENERGAN) 12.5 MG tablet Take 1 tablet (12.5 mg total) by mouth every 6 (six) hours as needed for nausea or vomiting. 02/13/16   Merlyn Lot, MD  ranitidine (ZANTAC) 300 MG capsule Take 1 capsule (300 mg total) by mouth every evening. Patient not taking: Reported on 02/13/2016 03/23/15   Steele Sizer, MD    Allergies Bextra  [valdecoxib]; Compazine  [prochlorperazine edisylate]; Lithium carbonate; and Lyrica [pregabalin]    Social History Social History  Substance Use Topics  . Smoking status: Former Smoker    Packs/day: 1.00    Years: 35.00    Types: Cigarettes    Quit date: 01/29/2012  . Smokeless tobacco: Never Used  . Alcohol use No    Review of Systems Patient denies headaches, rhinorrhea, blurry vision, numbness, shortness of breath, chest pain, edema, cough, abdominal pain, nausea, vomiting, diarrhea, dysuria, fevers, rashes or hallucinations unless otherwise stated above in HPI. ____________________________________________   PHYSICAL EXAM:  VITAL SIGNS: Vitals:   02/13/16 1800 02/13/16 1900  BP: (!) 147/70 (!) 136/58  Pulse: 70 62  Resp: 18 11  Temp:      Constitutional: Alert and  oriented. Frail appearing but in no acute distress. Eyes: Conjunctivae are normal. PERRL. EOMI. Head: Atraumatic. Nose: No congestion/rhinnorhea. Mouth/Throat: Mucous membranes are dry.  Oropharynx non-erythematous. Neck: No stridor. Painless ROM. No cervical spine tenderness to palpation Hematological/Lymphatic/Immunilogical: No cervical lymphadenopathy. Cardiovascular: Normal rate, regular rhythm. Grossly normal heart sounds.  Good peripheral circulation. Respiratory: Normal respiratory effort.  No retractions. Lungs CTAB. Gastrointestinal: Soft but with mild epigastric and left lower quadrant tenderness to palpation. No rebound or guarding. No distention. No abdominal bruits. No CVA tenderness. Musculoskeletal: No lower extremity tenderness nor edema.  No joint effusions. Neurologic:  Normal speech and language. No gross focal neurologic deficits are appreciated. No gait instability. Skin:  Skin is warm, dry and intact. No rash noted. Psychiatric: Mood and affect are normal. Speech and behavior are normal.  ____________________________________________   LABS (all labs ordered are listed, but only abnormal results are displayed)  Results for orders placed or performed during the hospital encounter of 02/13/16 (from the past 24  hour(s))  Lipase, blood     Status: None   Collection Time: 02/13/16  2:57 PM  Result Value Ref Range   Lipase 27 11 - 51 U/L  Comprehensive metabolic panel     Status: Abnormal   Collection Time: 02/13/16  2:57 PM  Result Value Ref Range   Sodium 141 135 - 145 mmol/L   Potassium 4.0 3.5 - 5.1 mmol/L   Chloride 110 101 - 111 mmol/L   CO2 25 22 - 32 mmol/L   Glucose, Bld 108 (H) 65 - 99 mg/dL   BUN 16 6 - 20 mg/dL   Creatinine, Ser 0.98 0.44 - 1.00 mg/dL   Calcium 9.5 8.9 - 10.3 mg/dL   Total Protein 6.6 6.5 - 8.1 g/dL   Albumin 3.5 3.5 - 5.0 g/dL   AST 24 15 - 41 U/L   ALT 16 14 - 54 U/L   Alkaline Phosphatase 72 38 - 126 U/L   Total Bilirubin 0.9 0.3  - 1.2 mg/dL   GFR calc non Af Amer 56 (L) >60 mL/min   GFR calc Af Amer >60 >60 mL/min   Anion gap 6 5 - 15  CBC     Status: Abnormal   Collection Time: 02/13/16  2:57 PM  Result Value Ref Range   WBC 9.5 3.6 - 11.0 K/uL   RBC 4.55 3.80 - 5.20 MIL/uL   Hemoglobin 13.9 12.0 - 16.0 g/dL   HCT 40.9 35.0 - 47.0 %   MCV 89.9 80.0 - 100.0 fL   MCH 30.7 26.0 - 34.0 pg   MCHC 34.1 32.0 - 36.0 g/dL   RDW 15.0 (H) 11.5 - 14.5 %   Platelets 231 150 - 440 K/uL  Troponin I     Status: None   Collection Time: 02/13/16  2:57 PM  Result Value Ref Range   Troponin I <0.03 <0.03 ng/mL  Urinalysis, Complete w Microscopic     Status: Abnormal   Collection Time: 02/13/16  3:50 PM  Result Value Ref Range   Color, Urine YELLOW (A) YELLOW   APPearance CLEAR (A) CLEAR   Specific Gravity, Urine 1.016 1.005 - 1.030   pH 5.0 5.0 - 8.0   Glucose, UA NEGATIVE NEGATIVE mg/dL   Hgb urine dipstick NEGATIVE NEGATIVE   Bilirubin Urine NEGATIVE NEGATIVE   Ketones, ur 20 (A) NEGATIVE mg/dL   Protein, ur NEGATIVE NEGATIVE mg/dL   Nitrite NEGATIVE NEGATIVE   Leukocytes, UA NEGATIVE NEGATIVE   RBC / HPF 0-5 0 - 5 RBC/hpf   WBC, UA 0-5 0 - 5 WBC/hpf   Bacteria, UA NONE SEEN NONE SEEN   Squamous Epithelial / LPF 0-5 (A) NONE SEEN   Mucous PRESENT    ____________________________________________  EKG My review and personal interpretation at Time: 15:42   Indication: nausea  Rate: 65 Rhythm: sinus Axis: normal Other: normal intervals, non specific st changes, no STEMI ____________________________________________  RADIOLOGY  I personally reviewed all radiographic images ordered to evaluate for the above acute complaints and reviewed radiology reports and findings.  These findings were personally discussed with the patient.  Please see medical record for radiology report.  ____________________________________________   PROCEDURES  Procedure(s) performed:  Procedures    Critical Care performed:  no ____________________________________________   INITIAL IMPRESSION / ASSESSMENT AND PLAN / ED COURSE  Pertinent labs & imaging results that were available during my care of the patient were reviewed by me and considered in my medical decision making (see chart for details).  DDX: Dehydration, electrolyte abnormality, obstruction, diverticulitis, enteritis, ACS, anxiety, anorexia  Beth [name] is a 71 y.o. who presents to the ED with above complaints.  Patient is AFVSS in ED. Exam as above. Given current presentation have considered the above differential. Based on her duration symptoms, age and frailty we will provide IV fluids. Blood work is relatively reassuring. Based on tenderness will order CT scan and evaluate for acute intra-abdominal process. We'll provide anti-medics.  The patient will be placed on continuous pulse oximetry and telemetry for monitoring.  Laboratory evaluation will be sent to evaluate for the above complaints.     Clinical Course as of Feb 12 2199  Tue Feb 13, 2016  1713 Reviewed CT imaging. Was sensible finding of mildly dilated common bile duct. LFTs are normal therefore I feel this is likely a chronic finding.  [PR]    Clinical Course User Index [PR] Merlyn Lot, MD   Patient received IVF with improvement in symptoms.  Patient was able to tolerate PO and was able to ambulate with a steady gait.  As she remains stable with reassuring blood work and CT imagingg, I do feel patient is appropriate for continued outpatient workup.  Discussed signs and symptoms for which the patient should return to the Er.  Have discussed with the patient and available family all diagnostics and treatments performed thus far and all questions were answered to the best of my ability. The patient demonstrates understanding and agreement with plan.  ____________________________________________   FINAL CLINICAL IMPRESSION(S) / ED DIAGNOSES  Final diagnoses:  Nausea vomiting  and diarrhea      NEW MEDICATIONS STARTED DURING THIS VISIT:  Discharge Medication List as of 02/13/2016  7:04 PM    START taking these medications   Details  promethazine (PHENERGAN) 12.5 MG tablet Take 1 tablet (12.5 mg total) by mouth every 6 (six) hours as needed for nausea or vomiting., Starting Tue 02/13/2016, Print         Note:  This document was prepared using Dragon voice recognition software and may include unintentional dictation errors.    Merlyn Lot, MD 02/13/16 2203

## 2016-02-13 NOTE — Progress Notes (Signed)
Name: Beth [name]   MRN: [id number]    DOB: [birth year]   Date:02/13/2016       Progress Note  Subjective  Chief Complaint  Chief Complaint  Patient presents with  . Hospitalization Follow-up    pt is still very weak and pale looking has not been able to eat and not drinkining very much according to her husband    HPI  She went to [facility] on Friday for evaluation of nausea, vomiting and diarrhea. She was dehydrated and was given fluids, zofran and sent home. She states vomiting resolved but has epigastric pain and still has diarrhea, only eating a little apple sauce,  and drinking very little. She only drank 2 ounces of Lipton tea today. Last bowel movement was today - still watery, not sure if had any blood. She has been afebrile. Feeling very uncomfortable.   Patient Active Problem List   Diagnosis Date Noted  . Seasonal allergic rhinitis 03/23/2015  . Hyperglycemia 03/23/2015  . Senile purpura ([place]) 03/23/2015  . Perennial allergic rhinitis with seasonal variation 03/23/2015  . Marital problems 10/31/2014  . Migraine without aura and without status migrainosus, not intractable 10/31/2014  . Chronic LBP 08/09/2014  . Colon polyp 08/09/2014  . COPD, severe ([place]) 08/09/2014  . CVA, old, hemiparesis ([place]) 08/09/2014  . Dyslipidemia 08/09/2014  . Dysfunction of eustachian tube 08/09/2014  . Fibromyalgia syndrome 08/09/2014  . Gastro-esophageal reflux disease without esophagitis 08/09/2014  . Benign migrating glossitis 08/09/2014  . Cerebrovascular accident, old 08/09/2014  . IBS (irritable bowel syndrome) 08/09/2014  . Low back pain with radiation 08/09/2014  . Chronic recurrent major depressive disorder ([place]) 08/09/2014  . Dysmetabolic syndrome 88/41/6606  . OP (osteoporosis) 08/09/2014  . Vitamin D deficiency 08/09/2014  . Benign hypertension 07/19/2013  . Benign neoplasm of skin of trunk 06/03/2013  . H/O: pneumonia 09/25/2012    Past Surgical History:  Procedure  Laterality Date  . ABDOMINAL HYSTERECTOMY    . APPENDECTOMY    . BREAST SURGERY  2011   biopsy  . CERVICAL DISCECTOMY    . CHOLECYSTECTOMY    . SINUSOTOMY      Family History  Problem Relation Age of Onset  . Anxiety disorder Mother   . Depression Mother   . Cancer Father   . Gallbladder disease Father   . Alcohol abuse Father   . Depression Father     Social History   Social History  . Marital status: Married    Spouse name: N/A  . Number of children: N/A  . Years of education: N/A   Occupational History  . Not on file.   Social History Main Topics  . Smoking status: Former Smoker    Packs/day: 1.00    Years: 35.00    Types: Cigarettes    Quit date: 01/29/2012  . Smokeless tobacco: Never Used  . Alcohol use No  . Drug use: No  . Sexual activity: No   Other Topics Concern  . Not on file   Social History Narrative  . No narrative on file     Current Outpatient Prescriptions:  .  aspirin 81 MG tablet, Take 1 tablet by mouth daily., Disp: , Rfl:  .  atorvastatin (LIPITOR) 40 MG tablet, TAKE 1 TABLET(40 MG) BY MOUTH DAILY, Disp: 90 tablet, Rfl: 1 .  clonazePAM (KLONOPIN) 0.5 MG tablet, Take 0.5 tablets (0.25 mg total) by mouth 2 (two) times daily., Disp: 15 tablet, Rfl: 4 .  fluticasone (FLONASE) 50 MCG/ACT nasal  spray, Place 2 sprays into both nostrils daily., Disp: 16 g, Rfl: 2 .  fluticasone furoate-vilanterol (BREO ELLIPTA) 100-25 MCG/INH AEPB, Inhale 1 puff into the lungs daily., Disp: 120 each, Rfl: 1 .  gabapentin (NEURONTIN) 300 MG capsule, Take 1 capsule (300 mg total) by mouth 2 (two) times daily., Disp: 60 capsule, Rfl: 4 .  Ipratropium-Albuterol (COMBIVENT RESPIMAT) 20-100 MCG/ACT AERS respimat, Inhale 1 puff into the lungs every 6 (six) hours., Disp: 1 Inhaler, Rfl: 0 .  loratadine (CLARITIN) 10 MG tablet, TAKE 1 TABLET(10 MG) BY MOUTH DAILY, Disp: 30 tablet, Rfl: 5 .  metoprolol succinate (TOPROL-XL) 25 MG 24 hr tablet, Take 1 tablet (25 mg total) by  mouth daily., Disp: 90 tablet, Rfl: 0 .  mirtazapine (REMERON) 15 MG tablet, Take 1 tablet (15 mg total) by mouth at bedtime., Disp: 30 tablet, Rfl: 2 .  morphine (MS CONTIN) 15 MG 12 hr tablet, Take 1 tablet by mouth 2 (two) times daily., Disp: , Rfl:  .  ondansetron (ZOFRAN ODT) 4 MG disintegrating tablet, Take 1 tablet (4 mg total) by mouth every 8 (eight) hours as needed for nausea or vomiting., Disp: 12 tablet, Rfl: 0 .  potassium chloride SA (K-DUR,KLOR-CON) 20 MEQ tablet, Take 1 tablet (20 mEq total) by mouth daily., Disp: 30 tablet, Rfl: 0 .  ranitidine (ZANTAC) 300 MG capsule, Take 1 capsule (300 mg total) by mouth every evening., Disp: 30 capsule, Rfl: 5 .  tiotropium (SPIRIVA HANDIHALER) 18 MCG inhalation capsule, Place 1 capsule (18 mcg total) into inhaler and inhale daily., Disp: 90 capsule, Rfl: 1  Allergies  Allergen Reactions  . Bextra  [Valdecoxib]   . Compazine  [Prochlorperazine Edisylate]   . Lithium Carbonate   . Lyrica [Pregabalin]      ROS  Ten systems reviewed and is negative except as mentioned in HPI   Objective  Vitals:   02/13/16 1400  BP: 100/64  Pulse: 83  Resp: 18  Temp: 97.8 F (36.6 C)  SpO2: 97%  Weight: 133 lb (60.3 kg)  Height: '5\' 5"'  (1.651 m)    Body mass index is 22.13 kg/m.  Physical Exam  Constitutional: Patient appears well-developed , in distress, moaning. Almost fell in our office when being checked in and had to be placed on a wheelchair.  HEENT: head atraumatic, normocephalic, pupils equal and reactive to light, , ecchymosis left eye - from recent fall, neck supple, throat within normal limits Cardiovascular: Normal rate, regular rhythm and normal heart sounds.  No murmur heard. No BLE edema. Pulmonary/Chest: Effort normal and breath sounds normal. No respiratory distress. Abdominal: Soft.  There is epigastric tenderness. Psychiatric: Patient has a normal mood and affect.  Recent Results (from the past 2160 hour(s))  CBC  with Differential     Status: Abnormal   Collection Time: 12/23/15 12:05 PM  Result Value Ref Range   WBC 10.0 3.6 - 11.0 K/uL   RBC 4.82 3.80 - 5.20 MIL/uL   Hemoglobin 14.8 12.0 - 16.0 g/dL   HCT 43.4 35.0 - 47.0 %   MCV 90.0 80.0 - 100.0 fL   MCH 30.7 26.0 - 34.0 pg   MCHC 34.1 32.0 - 36.0 g/dL   RDW 14.9 (H) 11.5 - 14.5 %   Platelets 231 150 - 440 K/uL   Neutrophils Relative % 60 %   Neutro Abs 6.0 1.4 - 6.5 K/uL   Lymphocytes Relative 30 %   Lymphs Abs 3.0 1.0 - 3.6 K/uL   Monocytes Relative  7 %   Monocytes Absolute 0.7 0.2 - 0.9 K/uL   Eosinophils Relative 2 %   Eosinophils Absolute 0.2 0 - 0.7 K/uL   Basophils Relative 1 %   Basophils Absolute 0.1 0 - 0.1 K/uL  Comprehensive metabolic panel     Status: Abnormal   Collection Time: 12/23/15 12:05 PM  Result Value Ref Range   Sodium 144 135 - 145 mmol/L   Potassium 3.2 (L) 3.5 - 5.1 mmol/L   Chloride 107 101 - 111 mmol/L   CO2 29 22 - 32 mmol/L   Glucose, Bld 104 (H) 65 - 99 mg/dL   BUN 11 6 - 20 mg/dL   Creatinine, Ser 0.79 0.44 - 1.00 mg/dL   Calcium 9.3 8.9 - 10.3 mg/dL   Total Protein 7.0 6.5 - 8.1 g/dL   Albumin 3.6 3.5 - 5.0 g/dL   AST 33 15 - 41 U/L   ALT 18 14 - 54 U/L   Alkaline Phosphatase 98 38 - 126 U/L   Total Bilirubin 0.7 0.3 - 1.2 mg/dL   GFR calc non Af Amer >60 >60 mL/min   GFR calc Af Amer >60 >60 mL/min    Comment: (NOTE) The eGFR has been calculated using the CKD EPI equation. This calculation has not been validated in all clinical situations. eGFR's persistently <60 mL/min signify possible Chronic Kidney Disease.    Anion gap 8 5 - 15  Lipase, blood     Status: None   Collection Time: 12/23/15 12:05 PM  Result Value Ref Range   Lipase 20 11 - 51 U/L  Troponin I     Status: None   Collection Time: 12/23/15 12:05 PM  Result Value Ref Range   Troponin I <0.03 <0.03 ng/mL  Lactic acid, plasma     Status: None   Collection Time: 12/23/15 12:05 PM  Result Value Ref Range   Lactic Acid,  Venous 1.3 0.5 - 1.9 mmol/L  Urinalysis complete, with microscopic ([facility] only)     Status: Abnormal   Collection Time: 12/23/15 12:06 PM  Result Value Ref Range   Color, Urine YELLOW (A) YELLOW   APPearance HAZY (A) CLEAR   Glucose, UA NEGATIVE NEGATIVE mg/dL   Bilirubin Urine 1+ (A) NEGATIVE   Ketones, ur NEGATIVE NEGATIVE mg/dL   Specific Gravity, Urine 1.010 1.005 - 1.030   Hgb urine dipstick NEGATIVE NEGATIVE   pH 6.0 5.0 - 8.0   Protein, ur NEGATIVE NEGATIVE mg/dL   Nitrite NEGATIVE NEGATIVE   Leukocytes, UA 3+ (A) NEGATIVE   RBC / HPF 0-5 0 - 5 RBC/hpf   WBC, UA 6-30 0 - 5 WBC/hpf   Bacteria, UA RARE (A) NONE SEEN   Squamous Epithelial / LPF 6-30 (A) NONE SEEN   Mucous PRESENT   Troponin I     Status: None   Collection Time: 12/23/15  4:38 PM  Result Value Ref Range   Troponin I <0.03 <0.03 ng/mL  Thyroid Panel With TSH     Status: None   Collection Time: 12/29/15  2:27 PM  Result Value Ref Range   T4, Total 6.5 4.5 - 12.0 ug/dL   T3 Uptake 29 22 - 35 %   Free Thyroxine Index 1.9 1.4 - 3.8   TSH 3.09 mIU/L    Comment:   Reference Range   > or = 20 Years  0.40-4.50   Pregnancy Range First trimester  0.26-2.66 Second trimester 0.55-2.73 Third trimester  0.43-2.91     Comprehensive metabolic  panel     Status: Abnormal   Collection Time: 02/09/16  1:34 PM  Result Value Ref Range   Sodium 141 135 - 145 mmol/L   Potassium 3.6 3.5 - 5.1 mmol/L    Comment: HEMOLYSIS AT THIS LEVEL MAY AFFECT RESULT   Chloride 107 101 - 111 mmol/L   CO2 26 22 - 32 mmol/L   Glucose, Bld 112 (H) 65 - 99 mg/dL   BUN 25 (H) 6 - 20 mg/dL   Creatinine, Ser 1.20 (H) 0.44 - 1.00 mg/dL   Calcium 9.6 8.9 - 10.3 mg/dL   Total Protein 7.6 6.5 - 8.1 g/dL   Albumin 3.8 3.5 - 5.0 g/dL   AST 29 15 - 41 U/L   ALT 18 14 - 54 U/L   Alkaline Phosphatase 96 38 - 126 U/L   Total Bilirubin 0.6 0.3 - 1.2 mg/dL   GFR calc non Af Amer 44 (L) >60 mL/min   GFR calc Af Amer 51 (L) >60 mL/min    Comment:  (NOTE) The eGFR has been calculated using the CKD EPI equation. This calculation has not been validated in all clinical situations. eGFR's persistently <60 mL/min signify possible Chronic Kidney Disease.    Anion gap 8 5 - 15  Troponin I     Status: None   Collection Time: 02/09/16  1:34 PM  Result Value Ref Range   Troponin I <0.03 <0.03 ng/mL  CBC with Differential     Status: Abnormal   Collection Time: 02/09/16  1:34 PM  Result Value Ref Range   WBC 8.1 3.6 - 11.0 K/uL   RBC 5.04 3.80 - 5.20 MIL/uL   Hemoglobin 15.2 12.0 - 16.0 g/dL   HCT 45.0 35.0 - 47.0 %   MCV 89.3 80.0 - 100.0 fL   MCH 30.3 26.0 - 34.0 pg   MCHC 33.9 32.0 - 36.0 g/dL   RDW 15.4 (H) 11.5 - 14.5 %   Platelets 262 150 - 440 K/uL   Neutrophils Relative % 64 %   Neutro Abs 5.2 1.4 - 6.5 K/uL   Lymphocytes Relative 23 %   Lymphs Abs 1.9 1.0 - 3.6 K/uL   Monocytes Relative 11 %   Monocytes Absolute 0.9 0.2 - 0.9 K/uL   Eosinophils Relative 2 %   Eosinophils Absolute 0.2 0 - 0.7 K/uL   Basophils Relative 0 %   Basophils Absolute 0.0 0 - 0.1 K/uL  Glucose, capillary     Status: None   Collection Time: 02/09/16  2:29 PM  Result Value Ref Range   Glucose-Capillary 97 65 - 99 mg/dL  Lipase, blood     Status: None   Collection Time: 02/13/16  2:57 PM  Result Value Ref Range   Lipase 27 11 - 51 U/L  Comprehensive metabolic panel     Status: Abnormal   Collection Time: 02/13/16  2:57 PM  Result Value Ref Range   Sodium 141 135 - 145 mmol/L   Potassium 4.0 3.5 - 5.1 mmol/L   Chloride 110 101 - 111 mmol/L   CO2 25 22 - 32 mmol/L   Glucose, Bld 108 (H) 65 - 99 mg/dL   BUN 16 6 - 20 mg/dL   Creatinine, Ser 0.98 0.44 - 1.00 mg/dL   Calcium 9.5 8.9 - 10.3 mg/dL   Total Protein 6.6 6.5 - 8.1 g/dL   Albumin 3.5 3.5 - 5.0 g/dL   AST 24 15 - 41 U/L   ALT 16 14 - 54 U/L  Alkaline Phosphatase 72 38 - 126 U/L   Total Bilirubin 0.9 0.3 - 1.2 mg/dL   GFR calc non Af Amer 56 (L) >60 mL/min   GFR calc Af Amer >60  >60 mL/min    Comment: (NOTE) The eGFR has been calculated using the CKD EPI equation. This calculation has not been validated in all clinical situations. eGFR's persistently <60 mL/min signify possible Chronic Kidney Disease.    Anion gap 6 5 - 15  CBC     Status: Abnormal   Collection Time: 02/13/16  2:57 PM  Result Value Ref Range   WBC 9.5 3.6 - 11.0 K/uL   RBC 4.55 3.80 - 5.20 MIL/uL   Hemoglobin 13.9 12.0 - 16.0 g/dL   HCT 40.9 35.0 - 47.0 %   MCV 89.9 80.0 - 100.0 fL   MCH 30.7 26.0 - 34.0 pg   MCHC 34.1 32.0 - 36.0 g/dL   RDW 15.0 (H) 11.5 - 14.5 %   Platelets 231 150 - 440 K/uL      PHQ2/9: Depression screen [facility] 2/9 12/29/2015 04/20/2015 03/23/2015 08/09/2014  Decreased Interest 1 1 0 0  Down, Depressed, Hopeless 3 3 0 1  PHQ - 2 Score 4 4 0 1  Altered sleeping 3 3 - -  Tired, decreased energy 3 3 - -  Change in appetite 3 3 - -  Feeling bad or failure about yourself  3 3 - -  Trouble concentrating 3 3 - -  Moving slowly or fidgety/restless 3 0 - -  Suicidal thoughts 0 0 - -  PHQ-9 Score 22 19 - -  Difficult doing work/chores Very difficult - - -     Fall Risk: Fall Risk  12/29/2015 09/29/2015 04/20/2015 03/23/2015 08/09/2014  Falls in the past year? No No No No Yes  Number falls in past yr: - - - - 2 or more  Injury with Fall? - - - - No      Assessment & Plan  1. Gastroenteritis  Diagnosed at [facility], but now having more epigastric pain, possible gastritis?   2. Dehydration  Refer back to [facility] for iv fluids, she also had hypokalemia, labs needs to be rechecked  3. Nausea  Improved with zofran  4. Epigastric pain  On Ranitidine, may need to go on PPI

## 2016-02-13 NOTE — ED Notes (Signed)

## 2016-02-13 NOTE — ED Triage Notes (Signed)
Per [facility], patient presents to ED recently dx with norovirus. Patient here because, "I'm not getting any better". Patient c/o lower abdominal pain. Patient denies vomiting today but states she had two episodes of diarrhea today. Patient moaning during triage.

## 2016-02-13 NOTE — ED Notes (Signed)
In and out cath completed by Carney Harder, Nursing student. Assisted by this RN. Patient tolerated well. Clear, amber urine returned. Sterile technique maintained.

## 2016-03-04 DIAGNOSIS — M5441 Lumbago with sciatica, right side: Secondary | ICD-10-CM | POA: Diagnosis not present

## 2016-03-04 DIAGNOSIS — M549 Dorsalgia, unspecified: Secondary | ICD-10-CM | POA: Diagnosis not present

## 2016-03-04 DIAGNOSIS — M542 Cervicalgia: Secondary | ICD-10-CM | POA: Diagnosis not present

## 2016-03-04 DIAGNOSIS — F1120 Opioid dependence, uncomplicated: Secondary | ICD-10-CM | POA: Diagnosis not present

## 2016-03-04 DIAGNOSIS — G8929 Other chronic pain: Secondary | ICD-10-CM | POA: Diagnosis not present

## 2016-03-04 DIAGNOSIS — M5030 Other cervical disc degeneration, unspecified cervical region: Secondary | ICD-10-CM | POA: Diagnosis not present

## 2016-03-04 DIAGNOSIS — M5137 Other intervertebral disc degeneration, lumbosacral region: Secondary | ICD-10-CM | POA: Diagnosis not present

## 2016-03-04 DIAGNOSIS — M150 Primary generalized (osteo)arthritis: Secondary | ICD-10-CM | POA: Diagnosis not present

## 2016-03-04 DIAGNOSIS — F419 Anxiety disorder, unspecified: Secondary | ICD-10-CM | POA: Diagnosis not present

## 2016-03-04 DIAGNOSIS — M5442 Lumbago with sciatica, left side: Secondary | ICD-10-CM | POA: Diagnosis not present

## 2016-03-05 ENCOUNTER — Encounter: Payer: Self-pay | Admitting: Psychiatry

## 2016-03-05 ENCOUNTER — Ambulatory Visit (INDEPENDENT_AMBULATORY_CARE_PROVIDER_SITE_OTHER): Payer: Medicare HMO | Admitting: Psychiatry

## 2016-03-05 VITALS — BP 126/64 | HR 84 | Temp 97.7°F | Wt 124.8 lb

## 2016-03-05 DIAGNOSIS — F1394 Sedative, hypnotic or anxiolytic use, unspecified with sedative, hypnotic or anxiolytic-induced mood disorder: Secondary | ICD-10-CM

## 2016-03-05 DIAGNOSIS — F331 Major depressive disorder, recurrent, moderate: Secondary | ICD-10-CM

## 2016-03-05 MED ORDER — CLONAZEPAM 0.5 MG PO TABS: 0.2500 mg | ORAL_TABLET | Freq: Every day | ORAL | 4 refills | Status: DC

## 2016-03-05 MED ORDER — GABAPENTIN 100 MG PO CAPS: 100.0000 mg | ORAL_CAPSULE | Freq: Two times a day (BID) | ORAL | 1 refills | Status: DC

## 2016-03-05 MED ORDER — MIRTAZAPINE 15 MG PO TABS: 15.0000 mg | ORAL_TABLET | Freq: Every day | ORAL | 2 refills | Status: DC

## 2016-03-05 NOTE — Progress Notes (Signed)
Patient ID: Beth [name], female   DOB: 1945/10/15, 71 y.o.   MRN: [id number] [facility] MD/PA/NP OP Progress Note  03/05/2016 2:22 PM [name]  MRN:  [id number]   Subjective: Patient is a 71 year old married female with history of major depression and generalized anxiety disorder presented for follow-up.She reported that she into the emergency room as she was having nausea and they gave her the medication for the same. She also saw her primary care physician recently. Patient reported that she continues to have a ship. Patient reported that now she has heat in her house as she is having conflict with her daughters. She reported that her daughter does not want to have any relationship with her. She reported that her granddaughter so does not want to communicate with her. Patient reported that she is feeling depressed due to relationship issues. Patient currently denied having any suicidal ideations or plans. Patient stated that she has been taking her medications as prescribed. She is also getting pain medications from Dr. Humphrey Rolls. She reported that she spoke to him yesterday about his situation and he did not say anything about her pain medications and her depression. She denied having any suicidal ideations or plans and remains focused on her relationship issues with her daughter. We discussed about talking to therapist in our office Elmyra Ricks as she can get help about the same. She does not seem interested at this time.   Chief Complaint:  Chief Complaint    Follow-up; Medication Refill     Visit Diagnosis:     ICD-9-CM ICD-10-CM   1. MDD (major depressive disorder), recurrent episode, moderate (HCC) 296.32 F33.1   2. Sedative, hypnotic or anxiolytic-induced mood disorder (HCC) 292.84 F13.94    E980.2      Past Medical History:  Past Medical History:  Diagnosis Date  . Allergy   . Anxiety   . Asthma   . COPD (chronic obstructive pulmonary disease) ([place])   . CVA (cerebral infarction)   .  Depression   . Fibromyalgia   . Headache   . Hyperlipidemia   . Hypertension   . IBS (irritable bowel syndrome)   . Stroke ([place])   . Vitamin D deficiency     Past Surgical History:  Procedure Laterality Date  . ABDOMINAL HYSTERECTOMY    . APPENDECTOMY    . BREAST SURGERY  2011   biopsy  . CERVICAL DISCECTOMY    . CHOLECYSTECTOMY    . SINUSOTOMY     Family History:  Family History  Problem Relation Age of Onset  . Anxiety disorder Mother   . Depression Mother   . Cancer Father   . Gallbladder disease Father   . Alcohol abuse Father   . Depression Father    Social History:  Social History   Social History  . Marital status: Married    Spouse name: N/A  . Number of children: N/A  . Years of education: N/A   Social History Main Topics  . Smoking status: Former Smoker    Packs/day: 1.00    Years: 35.00    Types: Cigarettes    Quit date: 01/29/2012  . Smokeless tobacco: Never Used  . Alcohol use No  . Drug use: No  . Sexual activity: No   Other Topics Concern  . None   Social History Narrative  . None   Additional History:   Assessment:   Musculoskeletal: Strength & Muscle Tone: within normal limits Gait & Station: Slow Patient leans: N/A  Psychiatric Specialty Exam: Medication Refill   Anxiety  Symptoms include nervous/anxious behavior. Patient reports no suicidal ideas. Insomnia: Stated the mirtazapine did help her with insomnia.      Review of Systems  Psychiatric/Behavioral: Negative for hallucinations, memory loss and suicidal ideas. Depression: he continues to have depression but does state the medications do help. The patient is nervous/anxious. Insomnia: Stated the mirtazapine did help her with insomnia.     Blood pressure 126/64, pulse 84, temperature 97.7 F (36.5 C), temperature source Oral, weight 124 lb 12.8 oz (56.6 kg).Body mass index is 20.14 kg/m.  General Appearance: Well Groomed  Eye Contact:  Good  Speech:  slow  Volume:   Decreased  Mood:  Anxious  Affect:  Congruent  Thought Process:  Linear  Orientation:  Full (Time, Place, and Person)  Thought Content:  Negative  Suicidal Thoughts:  No  Homicidal Thoughts:  No  Memory:  Immediate;   Good Recent;   Good Remote;   Good  Judgement:  Good  Insight:  Good  Psychomotor Activity:  Psychomotor Retardation  Concentration:  Good  Recall:  Good  Fund of Knowledge: Good  Language: Good  Akathisia:  Negative  Handed:  Right  AIMS (if indicated):  N/A  Assets:  Social Support  ADL's:  Intact  Cognition: WNL  Sleep: fair, improved with remeron   Is the patient at risk to self?  No. Has the patient been a risk to self in the past 6 months?  No. Has the patient been a risk to self within the distant past?  No. Is the patient a risk to others?  No. Has the patient been a risk to others in the past 6 months?  No. Has the patient been a risk to others within the distant past?  No.  Current Medications: Current Outpatient Prescriptions  Medication Sig Dispense Refill  . aspirin 81 MG tablet Take 1 tablet by mouth daily.    Marland Kitchen atorvastatin (LIPITOR) 40 MG tablet TAKE 1 TABLET(40 MG) BY MOUTH DAILY 90 tablet 1  . clonazePAM (KLONOPIN) 0.5 MG tablet Take 0.5 tablets (0.25 mg total) by mouth 2 (two) times daily. 15 tablet 4  . fluticasone (FLONASE) 50 MCG/ACT nasal spray Place 2 sprays into both nostrils daily. 16 g 2  . fluticasone furoate-vilanterol (BREO ELLIPTA) 100-25 MCG/INH AEPB Inhale 1 puff into the lungs daily. 120 each 1  . gabapentin (NEURONTIN) 300 MG capsule Take 1 capsule (300 mg total) by mouth 2 (two) times daily. 60 capsule 4  . Ipratropium-Albuterol (COMBIVENT RESPIMAT) 20-100 MCG/ACT AERS respimat Inhale 1 puff into the lungs every 6 (six) hours. 1 Inhaler 0  . loratadine (CLARITIN) 10 MG tablet TAKE 1 TABLET(10 MG) BY MOUTH DAILY 30 tablet 5  . metoprolol succinate (TOPROL-XL) 25 MG 24 hr tablet Take 1 tablet (25 mg total) by mouth daily. 90  tablet 0  . mirtazapine (REMERON) 15 MG tablet Take 1 tablet (15 mg total) by mouth at bedtime. 30 tablet 2  . morphine (MS CONTIN) 15 MG 12 hr tablet Take 1 tablet by mouth 2 (two) times daily.    . ondansetron (ZOFRAN ODT) 4 MG disintegrating tablet Take 1 tablet (4 mg total) by mouth every 8 (eight) hours as needed for nausea or vomiting. 12 tablet 0  . potassium chloride SA (K-DUR,KLOR-CON) 20 MEQ tablet Take 1 tablet (20 mEq total) by mouth daily. 30 tablet 0  . promethazine (PHENERGAN) 12.5 MG tablet Take 1 tablet (12.5 mg total)  by mouth every 6 (six) hours as needed for nausea or vomiting. 12 tablet 0  . ranitidine (ZANTAC) 300 MG capsule Take 1 capsule (300 mg total) by mouth every evening. 30 capsule 5  . tiotropium (SPIRIVA HANDIHALER) 18 MCG inhalation capsule Place 1 capsule (18 mcg total) into inhaler and inhale daily. 90 capsule 1   No current facility-administered medications for this visit.     Medical Decision Making:  Established Problem, Stable/Improving (1)  Treatment Plan Summary:Medication management and Plan   Continue Remeron 15 mg at bedtime mg at bedtime.   Generalized anxiety disorder.  Decrease Clonazepam to 0.25mg  po qhs  Patient will be given the 15 day supply at this time.With 4  Refills. I will decrease the dose of gabapentin 100 mg twice daily to decrease sedation. She will continue on Remeron 15 mg at bedtime. She  is also on MS Contin twice daily Follow-up in 4 weeks. She agreed with the plan.    More than 50% of the time spent in psychoeducation, counseling and coordination of care.    This note was generated in part or whole with voice recognition software. Voice regonition is usually quite accurate but there are transcription errors that can and very often do occur. I apologize for any typographical errors that were not detected and corrected.   Rainey Pines, MD  03/05/2016, 2:22 PM

## 2016-03-21 ENCOUNTER — Encounter: Payer: Self-pay | Admitting: Family Medicine

## 2016-03-21 ENCOUNTER — Ambulatory Visit (INDEPENDENT_AMBULATORY_CARE_PROVIDER_SITE_OTHER): Payer: Self-pay | Admitting: Family Medicine

## 2016-03-21 VITALS — BP 134/72 | HR 75 | Temp 97.8°F | Resp 16 | Ht 65.0 in | Wt 120.8 lb

## 2016-03-21 DIAGNOSIS — E785 Hyperlipidemia, unspecified: Secondary | ICD-10-CM | POA: Diagnosis not present

## 2016-03-21 DIAGNOSIS — E559 Vitamin D deficiency, unspecified: Secondary | ICD-10-CM | POA: Diagnosis not present

## 2016-03-21 DIAGNOSIS — R112 Nausea with vomiting, unspecified: Secondary | ICD-10-CM | POA: Diagnosis not present

## 2016-03-21 DIAGNOSIS — K921 Melena: Secondary | ICD-10-CM | POA: Diagnosis not present

## 2016-03-21 DIAGNOSIS — D692 Other nonthrombocytopenic purpura: Secondary | ICD-10-CM

## 2016-03-21 DIAGNOSIS — R7303 Prediabetes: Secondary | ICD-10-CM

## 2016-03-21 DIAGNOSIS — R634 Abnormal weight loss: Secondary | ICD-10-CM | POA: Diagnosis not present

## 2016-03-21 DIAGNOSIS — Z9049 Acquired absence of other specified parts of digestive tract: Secondary | ICD-10-CM

## 2016-03-21 DIAGNOSIS — J449 Chronic obstructive pulmonary disease, unspecified: Secondary | ICD-10-CM

## 2016-03-21 DIAGNOSIS — I69359 Hemiplegia and hemiparesis following cerebral infarction affecting unspecified side: Secondary | ICD-10-CM

## 2016-03-21 DIAGNOSIS — K838 Other specified diseases of biliary tract: Secondary | ICD-10-CM

## 2016-03-21 LAB — TSH: TSH: 3.04 m[IU]/L

## 2016-03-21 LAB — CBC WITH DIFFERENTIAL/PLATELET
BASOS ABS: 0 {cells}/uL (ref 0–200)
Basophils Relative: 0 %
Eosinophils Absolute: 166 cells/uL (ref 15–500)
Eosinophils Relative: 2 %
HEMATOCRIT: 42.8 % (ref 35.0–45.0)
Hemoglobin: 14.2 g/dL (ref 11.7–15.5)
LYMPHS ABS: 2324 {cells}/uL (ref 850–3900)
LYMPHS PCT: 28 %
MCH: 30.5 pg (ref 27.0–33.0)
MCHC: 33.2 g/dL (ref 32.0–36.0)
MCV: 91.8 fL (ref 80.0–100.0)
MONO ABS: 498 {cells}/uL (ref 200–950)
MPV: 10.2 fL (ref 7.5–12.5)
Monocytes Relative: 6 %
NEUTROS PCT: 64 %
Neutro Abs: 5312 cells/uL (ref 1500–7800)
Platelets: 212 10*3/uL (ref 140–400)
RBC: 4.66 MIL/uL (ref 3.80–5.10)
RDW: 15.8 % — ABNORMAL HIGH (ref 11.0–15.0)
WBC: 8.3 10*3/uL (ref 3.8–10.8)

## 2016-03-21 LAB — COMPLETE METABOLIC PANEL WITH GFR
ALT: 13 U/L (ref 6–29)
AST: 18 U/L (ref 10–35)
Albumin: 3.9 g/dL (ref 3.6–5.1)
Alkaline Phosphatase: 84 U/L (ref 33–130)
BUN: 12 mg/dL (ref 7–25)
CHLORIDE: 106 mmol/L (ref 98–110)
CO2: 29 mmol/L (ref 20–31)
CREATININE: 0.89 mg/dL (ref 0.60–0.93)
Calcium: 9.8 mg/dL (ref 8.6–10.4)
GFR, EST AFRICAN AMERICAN: 75 mL/min (ref >=60)
GFR, Est Non African American: 65 mL/min (ref >=60)
Glucose, Bld: 117 mg/dL — ABNORMAL HIGH (ref 65–99)
POTASSIUM: 4.6 mmol/L (ref 3.5–5.3)
Sodium: 146 mmol/L (ref 135–146)
Total Bilirubin: 0.7 mg/dL (ref 0.2–1.2)
Total Protein: 6.7 g/dL (ref 6.1–8.1)

## 2016-03-21 LAB — POC HEMOCCULT BLD/STL (OFFICE/1-CARD/DIAGNOSTIC): Fecal Occult Blood, POC: NEGATIVE

## 2016-03-21 LAB — LIPID PANEL
CHOL/HDL RATIO: 2.6 ratio (ref <5.0)
CHOLESTEROL: 105 mg/dL (ref <200)
HDL: 40 mg/dL — ABNORMAL LOW (ref >50)
LDL CALC: 47 mg/dL (ref <100)
TRIGLYCERIDES: 92 mg/dL (ref <150)
VLDL: 18 mg/dL (ref <30)

## 2016-03-21 MED ORDER — FLUTICASONE FUROATE-VILANTEROL 100-25 MCG/INH IN AEPB: 1.0000 | INHALATION_SPRAY | Freq: Every day | RESPIRATORY_TRACT | 1 refills | Status: DC

## 2016-03-21 MED ORDER — OMEPRAZOLE 40 MG PO CPDR: 40.0000 mg | DELAYED_RELEASE_CAPSULE | Freq: Every day | ORAL | 1 refills | Status: DC

## 2016-03-21 MED ORDER — TIOTROPIUM BROMIDE MONOHYDRATE 1.25 MCG/ACT IN AERS: 1.0000 | INHALATION_SPRAY | Freq: Every day | RESPIRATORY_TRACT | 5 refills | Status: DC
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
## 2016-03-22 ENCOUNTER — Ambulatory Visit: Payer: Self-pay | Admitting: Family Medicine

## 2016-03-22 LAB — VITAMIN D 25 HYDROXY (VIT D DEFICIENCY, FRACTURES): VIT D 25 HYDROXY: 37 ng/mL (ref 30–100)

## 2016-03-22 LAB — HEMOGLOBIN A1C
Hgb A1c MFr Bld: 5.3 % (ref <5.7)
Mean Plasma Glucose: 105 mg/dL

## 2016-03-22 LAB — SEDIMENTATION RATE: SED RATE: 1 mm/h (ref 0–30)

## 2016-03-22 LAB — H. PYLORI BREATH TEST: H. PYLORI BREATH TEST: NOT DETECTED

## 2016-03-22 LAB — C-REACTIVE PROTEIN: CRP: 0.3 mg/L (ref <8.0)

## 2016-03-26 ENCOUNTER — Telehealth: Payer: Self-pay | Admitting: Family Medicine

## 2016-03-26 NOTE — Telephone Encounter (Signed)
Spoke to pt this morning about her results but she did not mention this to me, please advise

## 2016-03-26 NOTE — Telephone Encounter (Signed)
Call pt to schedule appointment?

## 2016-03-26 NOTE — Telephone Encounter (Signed)
Pt was informed that her labs where normal however patient is still having diarrhea, pain in stomach and nausea. Please advise [phone]

## 2016-03-26 NOTE — Telephone Encounter (Signed)
Please call her back

## 2016-04-01 ENCOUNTER — Ambulatory Visit: Payer: Self-pay | Admitting: Family Medicine

## 2016-04-04 ENCOUNTER — Telehealth: Payer: Self-pay | Admitting: Psychiatry

## 2016-04-22 ENCOUNTER — Ambulatory Visit: Payer: PPO | Admitting: Gastroenterology

## 2016-04-24 ENCOUNTER — Other Ambulatory Visit: Payer: Self-pay | Admitting: Family Medicine

## 2016-04-24 DIAGNOSIS — I10 Essential (primary) hypertension: Secondary | ICD-10-CM

## 2016-04-24 NOTE — Telephone Encounter (Signed)
Patient requesting refill of Metoprolol to [facility].

## 2016-05-01 DIAGNOSIS — F332 Major depressive disorder, recurrent severe without psychotic features: Secondary | ICD-10-CM | POA: Insufficient documentation

## 2016-05-01 DIAGNOSIS — M549 Dorsalgia, unspecified: Secondary | ICD-10-CM | POA: Diagnosis not present

## 2016-05-01 DIAGNOSIS — R531 Weakness: Secondary | ICD-10-CM | POA: Diagnosis not present

## 2016-05-01 DIAGNOSIS — R69 Illness, unspecified: Secondary | ICD-10-CM | POA: Diagnosis not present

## 2016-05-02 DIAGNOSIS — E870 Hyperosmolality and hypernatremia: Secondary | ICD-10-CM | POA: Diagnosis not present

## 2016-05-02 DIAGNOSIS — R634 Abnormal weight loss: Secondary | ICD-10-CM | POA: Diagnosis not present

## 2016-05-02 DIAGNOSIS — R262 Difficulty in walking, not elsewhere classified: Secondary | ICD-10-CM | POA: Diagnosis not present

## 2016-05-02 DIAGNOSIS — R69 Illness, unspecified: Secondary | ICD-10-CM | POA: Diagnosis not present

## 2016-05-02 DIAGNOSIS — M810 Age-related osteoporosis without current pathological fracture: Secondary | ICD-10-CM | POA: Diagnosis not present

## 2016-05-02 DIAGNOSIS — K219 Gastro-esophageal reflux disease without esophagitis: Secondary | ICD-10-CM | POA: Diagnosis not present

## 2016-05-02 DIAGNOSIS — G4700 Insomnia, unspecified: Secondary | ICD-10-CM | POA: Diagnosis not present

## 2016-05-02 DIAGNOSIS — G8929 Other chronic pain: Secondary | ICD-10-CM | POA: Diagnosis not present

## 2016-05-02 DIAGNOSIS — R079 Chest pain, unspecified: Secondary | ICD-10-CM | POA: Diagnosis not present

## 2016-05-02 DIAGNOSIS — F419 Anxiety disorder, unspecified: Secondary | ICD-10-CM | POA: Diagnosis not present

## 2016-05-02 DIAGNOSIS — K635 Polyp of colon: Secondary | ICD-10-CM | POA: Diagnosis not present

## 2016-05-02 DIAGNOSIS — M797 Fibromyalgia: Secondary | ICD-10-CM | POA: Diagnosis not present

## 2016-05-02 DIAGNOSIS — M545 Low back pain: Secondary | ICD-10-CM | POA: Diagnosis not present

## 2016-05-02 DIAGNOSIS — N390 Urinary tract infection, site not specified: Secondary | ICD-10-CM | POA: Diagnosis not present

## 2016-05-02 DIAGNOSIS — R45851 Suicidal ideations: Secondary | ICD-10-CM | POA: Diagnosis not present

## 2016-05-02 DIAGNOSIS — J449 Chronic obstructive pulmonary disease, unspecified: Secondary | ICD-10-CM | POA: Diagnosis not present

## 2016-05-02 DIAGNOSIS — M549 Dorsalgia, unspecified: Secondary | ICD-10-CM | POA: Diagnosis not present

## 2016-05-02 DIAGNOSIS — R531 Weakness: Secondary | ICD-10-CM | POA: Diagnosis not present

## 2016-05-07 ENCOUNTER — Telehealth: Payer: Self-pay

## 2016-05-07 ENCOUNTER — Telehealth: Payer: Self-pay | Admitting: Family Medicine

## 2016-05-07 NOTE — Telephone Encounter (Signed)
medical student who is at [employer] states she is takin care of pt.  they are getting pt off the klonopin.  they wanted you to be aware that pt was taking 1 mg three times a day and now they have taken her off of it.      She is unaware on how pt got that many pill or that mg.

## 2016-05-07 NOTE — Telephone Encounter (Signed)
Called Pt to schedule AWV with [facility] - knb °

## 2016-05-08 DIAGNOSIS — F1320 Sedative, hypnotic or anxiolytic dependence, uncomplicated: Secondary | ICD-10-CM | POA: Insufficient documentation

## 2016-06-03 ENCOUNTER — Ambulatory Visit: Payer: Self-pay | Admitting: Psychiatry

## 2016-06-14 ENCOUNTER — Encounter: Payer: Self-pay | Admitting: Family Medicine

## 2016-06-14 ENCOUNTER — Ambulatory Visit (INDEPENDENT_AMBULATORY_CARE_PROVIDER_SITE_OTHER): Payer: Self-pay | Admitting: Family Medicine

## 2016-06-14 VITALS — BP 110/64 | HR 89 | Temp 98.0°F | Resp 16 | Ht 65.0 in | Wt 112.9 lb

## 2016-06-14 DIAGNOSIS — F339 Major depressive disorder, recurrent, unspecified: Secondary | ICD-10-CM

## 2016-06-14 DIAGNOSIS — R634 Abnormal weight loss: Secondary | ICD-10-CM

## 2016-06-14 DIAGNOSIS — J449 Chronic obstructive pulmonary disease, unspecified: Secondary | ICD-10-CM

## 2016-06-14 DIAGNOSIS — I69359 Hemiplegia and hemiparesis following cerebral infarction affecting unspecified side: Secondary | ICD-10-CM

## 2016-06-14 DIAGNOSIS — L989 Disorder of the skin and subcutaneous tissue, unspecified: Secondary | ICD-10-CM

## 2016-06-14 DIAGNOSIS — J302 Other seasonal allergic rhinitis: Secondary | ICD-10-CM

## 2016-06-14 DIAGNOSIS — R7303 Prediabetes: Secondary | ICD-10-CM

## 2016-06-14 DIAGNOSIS — E785 Hyperlipidemia, unspecified: Secondary | ICD-10-CM

## 2016-06-14 DIAGNOSIS — I10 Essential (primary) hypertension: Secondary | ICD-10-CM

## 2016-06-14 DIAGNOSIS — D692 Other nonthrombocytopenic purpura: Secondary | ICD-10-CM

## 2016-06-14 MED ORDER — METOPROLOL SUCCINATE ER 25 MG PO TB24: 25.0000 mg | ORAL_TABLET | Freq: Every day | ORAL | 1 refills | Status: DC

## 2016-06-14 MED ORDER — AZELASTINE HCL 0.1 % NA SOLN: 1.0000 | Freq: Two times a day (BID) | NASAL | 2 refills | Status: DC

## 2016-06-14 MED ORDER — MIRTAZAPINE 30 MG PO TABS: 30.0000 mg | ORAL_TABLET | Freq: Every day | ORAL | 0 refills | Status: DC

## 2016-06-14 MED ORDER — IPRATROPIUM-ALBUTEROL 20-100 MCG/ACT IN AERS: 1.0000 | INHALATION_SPRAY | Freq: Four times a day (QID) | RESPIRATORY_TRACT | 0 refills | Status: DC

## 2016-06-14 MED ORDER — ATORVASTATIN CALCIUM 40 MG PO TABS: ORAL_TABLET | ORAL | 1 refills | Status: DC

## 2016-06-14 NOTE — Progress Notes (Signed)
Name: MARLYN TONDREAU   MRN: [id number]    DOB: 01/14/1946   Date:06/14/2016       Progress Note  Subjective  Chief Complaint  Chief Complaint  Patient presents with  . Hospitalization Follow-up    Patient went to [facility] for anxiety attacks and poor appetite was given Gabapentin, Mirtazapine, Spiriva and Trazodone.  . Depression    Loud noise and stress makes it worst  . Anxiety    HPI  Husband came in with her, and kept asking questions about himself and why she cannot take Clonazepam  Seasonal Allergies: she has been using Flonase and is causing nose bleed, we will try switching to Astelin.  COPD severe: she has been using Breo most days and also Spiriva. She had  quit smoking in 2014 but she is occasionally smoking here and there. She has a chronic cough, most days, mild decrease in exercise tolerance, and occasional wheezing. No recent flares.   HTN: taking metoprolol for bp and migraine prevention. She states she gets dizzy in am's, she states symptoms controlled if she gets up slowly. She has not been having any migraine episodes in months. She is only drinking one glass of water daily, explained that she needs to drink at least 64 ounces per day, it will help with dizziness.  History of CVA with left side hemiparesis: doing much better, very mild symptoms now. Taking aspirin and statin therapy, she is still slightly weak on left leg.   Major Depression recurrent: sees Dr. Maricela Curet . Had to be admitted to [facility] because worsening of Depression, stayed in [place] from 05/01/2016 until 05/24/2016, she is not taking Remeron , states could not find it at home, on higher dose Gabapentin to control pain ( off morphine ) and taking Trazodone only as prescribed. Still feeling anxious, she needs to see Dr. Maricela Curet  Metabolic Syndrome: she does not have DM, only metabolic syndrome. Last hgbA1C was 5.7%. She denies polyphagia, polydipsia or polyuria.   Patient Active Problem List    Diagnosis Date Noted  . Seasonal allergic rhinitis 03/23/2015  . Hyperglycemia 03/23/2015  . Senile purpura ([place]) 03/23/2015  . Perennial allergic rhinitis with seasonal variation 03/23/2015  . Marital problems 10/31/2014  . Migraine without aura and without status migrainosus, not intractable 10/31/2014  . Chronic LBP 08/09/2014  . Colon polyp 08/09/2014  . COPD, severe ([place]) 08/09/2014  . CVA, old, hemiparesis ([place]) 08/09/2014  . Dyslipidemia 08/09/2014  . Dysfunction of eustachian tube 08/09/2014  . Fibromyalgia syndrome 08/09/2014  . Gastro-esophageal reflux disease without esophagitis 08/09/2014  . Benign migrating glossitis 08/09/2014  . Cerebrovascular accident, old 08/09/2014  . IBS (irritable bowel syndrome) 08/09/2014  . Low back pain with radiation 08/09/2014  . Chronic recurrent major depressive disorder ([place]) 08/09/2014  . Dysmetabolic syndrome 18/84/1660  . OP (osteoporosis) 08/09/2014  . Vitamin D deficiency 08/09/2014  . Benign hypertension 07/19/2013  . Benign neoplasm of skin of trunk 06/03/2013  . H/O: pneumonia 09/25/2012    Past Surgical History:  Procedure Laterality Date  . ABDOMINAL HYSTERECTOMY    . APPENDECTOMY    . BREAST SURGERY  2011   biopsy  . CERVICAL DISCECTOMY    . CHOLECYSTECTOMY    . SINUSOTOMY      Family History  Problem Relation Age of Onset  . Anxiety disorder Mother   . Depression Mother   . Cancer Father   . Gallbladder disease Father   . Alcohol abuse Father   . Depression  Father     Social History   Social History  . Marital status: Married    Spouse name: N/A  . Number of children: N/A  . Years of education: N/A   Occupational History  . Not on file.   Social History Main Topics  . Smoking status: Former Smoker    Packs/day: 1.00    Years: 35.00    Types: Cigarettes    Quit date: 01/29/2012  . Smokeless tobacco: Never Used  . Alcohol use No  . Drug use: No  . Sexual activity: No   Other Topics Concern   . Not on file   Social History Narrative  . No narrative on file     Current Outpatient Prescriptions:  .  aspirin 81 MG tablet, Take 1 tablet by mouth daily., Disp: , Rfl:  .  atorvastatin (LIPITOR) 40 MG tablet, TAKE 1 TABLET(40 MG) BY MOUTH DAILY, Disp: 90 tablet, Rfl: 1 .  fluticasone (FLONASE) 50 MCG/ACT nasal spray, Place 2 sprays into both nostrils daily., Disp: 16 g, Rfl: 2 .  fluticasone furoate-vilanterol (BREO ELLIPTA) 100-25 MCG/INH AEPB, Inhale 1 puff into the lungs daily., Disp: 120 each, Rfl: 1 .  gabapentin (NEURONTIN) 300 MG capsule, Take 600 mg by mouth., Disp: , Rfl:  .  Ipratropium-Albuterol (COMBIVENT RESPIMAT) 20-100 MCG/ACT AERS respimat, Inhale 1 puff into the lungs every 6 (six) hours., Disp: 1 Inhaler, Rfl: 0 .  loratadine (CLARITIN) 10 MG tablet, TAKE 1 TABLET(10 MG) BY MOUTH DAILY, Disp: 30 tablet, Rfl: 5 .  metoprolol succinate (TOPROL-XL) 25 MG 24 hr tablet, Take 1 tablet (25 mg total) by mouth daily., Disp: 90 tablet, Rfl: 1 .  mirtazapine (REMERON) 30 MG tablet, Take 1 tablet (30 mg total) by mouth daily., Disp: 30 tablet, Rfl: 0 .  omeprazole (PRILOSEC) 40 MG capsule, Take 1 capsule (40 mg total) by mouth daily., Disp: 30 capsule, Rfl: 1 .  ondansetron (ZOFRAN ODT) 4 MG disintegrating tablet, Take 1 tablet (4 mg total) by mouth every 8 (eight) hours as needed for nausea or vomiting., Disp: 12 tablet, Rfl: 0 .  Tiotropium Bromide Monohydrate (SPIRIVA RESPIMAT) 1.25 MCG/ACT AERS, Inhale 1 puff into the lungs daily., Disp: 4 g, Rfl: 5 .  traZODone (DESYREL) 50 MG tablet, Take 2 tablets by mouth every evening., Disp: , Rfl:  .  azelastine (ASTELIN) 0.1 % nasal spray, Place 1 spray into both nostrils 2 (two) times daily. Use in each nostril as directed, Disp: 30 mL, Rfl: 2  Allergies  Allergen Reactions  . Bextra  [Valdecoxib]   . Compazine  [Prochlorperazine Edisylate]   . Lithium Carbonate   . Lyrica [Pregabalin]      ROS  Constitutional: Negative for  fever or weight change.  Respiratory: Negative for cough and shortness of breath.   Cardiovascular: Negative for chest pain or palpitations.  Gastrointestinal: Negative for abdominal pain, no bowel changes.  Musculoskeletal: Negative for gait problem or joint swelling.  Skin: Negative for rash.  Neurological: Negative for dizziness or headache.  No other specific complaints in a complete review of systems (except as listed in HPI above).  Objective  Vitals:   06/14/16 0919  BP: 110/64  Pulse: 89  Resp: 16  Temp: 98 F (36.7 C)  TempSrc: Oral  SpO2: 95%  Weight: 112 lb 14.4 oz (51.2 kg)  Height: _0  (1.651 m)    Body mass index is 18.79 kg/m.  Physical Exam  Constitutional: Patient appears well-developed and well-nourished. Very thin.  No distress.  HEENT: head atraumatic, normocephalic, pupils equal and reactive to light, neck supple, throat within normal limits Cardiovascular: Normal rate, regular rhythm and normal heart sounds.  No murmur heard. No BLE edema. Pulmonary/Chest: Effort normal and breath sounds normal. No respiratory distress. Abdominal: Soft.  There is no tenderness. Skin: mild purpura arm, a scap on left arm, and small pea size white lesion on right upper arm Psychiatric: Patient has a normal mood and affect. behavior is normal. Judgment and thought content normal.  Recent Results (from the past 2160 hour(s))  H. pylori breath test     Status: None   Collection Time: 03/21/16  9:36 AM  Result Value Ref Range   H. pylori Breath Test NOT DETECTED Not Detected    Comment:   Antimicrobials, proton pump inhibitors, and bismuth preparations are known to suppress H. pylori, and ingestion of these prior to H. pylori diagnostic testing may lead to false negative results. If clinically indicated, the test may be repeated on a new specimen obtained two weeks after discontinuing treatment.     VITAMIN D 25 Hydroxy (Vit-D Deficiency, Fractures)     Status: None    Collection Time: 03/21/16  9:36 AM  Result Value Ref Range   Vit D, 25-Hydroxy 37 30 - 100 ng/mL    Comment: Vitamin D Status           25-OH Vitamin D        Deficiency                <20 ng/mL        Insufficiency         20 - 29 ng/mL        Optimal             > or = 30 ng/mL   For 25-OH Vitamin D testing on patients on D2-supplementation and patients for whom quantitation of D2 and D3 fractions is required, the QuestAssureD 25-OH VIT D, (D2,D3), LC/MS/MS is recommended: order code [phone] (patients > 2 yrs).   TSH     Status: None   Collection Time: 03/21/16  9:36 AM  Result Value Ref Range   TSH 3.04 mIU/L    Comment:   Reference Range   > or = 20 Years  0.40-4.50   Pregnancy Range First trimester  0.26-2.66 Second trimester 0.55-2.73 Third trimester  0.43-2.91     COMPLETE METABOLIC PANEL WITH GFR     Status: Abnormal   Collection Time: 03/21/16  9:36 AM  Result Value Ref Range   Sodium 146 135 - 146 mmol/L   Potassium 4.6 3.5 - 5.3 mmol/L   Chloride 106 98 - 110 mmol/L   CO2 29 20 - 31 mmol/L   Glucose, Bld 117 (H) 65 - 99 mg/dL   BUN 12 7 - 25 mg/dL   Creat 0.89 0.60 - 0.93 mg/dL    Comment:   For patients > or = 73 years of age: The upper reference limit for Creatinine is approximately 13% higher for people identified as African-American.      Total Bilirubin 0.7 0.2 - 1.2 mg/dL   Alkaline Phosphatase 84 33 - 130 U/L   AST 18 10 - 35 U/L   ALT 13 6 - 29 U/L   Total Protein 6.7 6.1 - 8.1 g/dL   Albumin 3.9 3.6 - 5.1 g/dL   Calcium 9.8 8.6 - 10.4 mg/dL   GFR, Est African American 75 >=60 mL/min  GFR, Est Non African American 65 >=60 mL/min  Lipid panel     Status: Abnormal   Collection Time: 03/21/16  9:36 AM  Result Value Ref Range   Cholesterol 105 <200 mg/dL   Triglycerides 92 <150 mg/dL   HDL 40 (L) >50 mg/dL   Total CHOL/HDL Ratio 2.6 <5.0 Ratio   VLDL 18 <30 mg/dL   LDL Cholesterol 47 <100 mg/dL  Hemoglobin A1c     Status: None    Collection Time: 03/21/16  9:36 AM  Result Value Ref Range   Hgb A1c MFr Bld 5.3 <5.7 %    Comment:   For the purpose of screening for the presence of diabetes:   <5.7%       Consistent with the absence of diabetes 5.7-6.4 %   Consistent with increased risk for diabetes (prediabetes) >=6.5 %     Consistent with diabetes   This assay result is consistent with a decreased risk of diabetes.   Currently, no consensus exists regarding use of hemoglobin A1c for diagnosis of diabetes in children.   According to American Diabetes Association (ADA) guidelines, hemoglobin A1c <7.0% represents optimal control in non-pregnant diabetic patients. Different metrics may apply to specific patient populations. Standards of Medical Care in Diabetes (ADA).      Mean Plasma Glucose 105 mg/dL  CBC with Differential/Platelet     Status: Abnormal   Collection Time: 03/21/16  9:36 AM  Result Value Ref Range   WBC 8.3 3.8 - 10.8 K/uL   RBC 4.66 3.80 - 5.10 MIL/uL   Hemoglobin 14.2 11.7 - 15.5 g/dL   HCT 42.8 35.0 - 45.0 %   MCV 91.8 80.0 - 100.0 fL   MCH 30.5 27.0 - 33.0 pg   MCHC 33.2 32.0 - 36.0 g/dL   RDW 15.8 (H) 11.0 - 15.0 %   Platelets 212 140 - 400 K/uL   MPV 10.2 7.5 - 12.5 fL   Neutro Abs 5,312 1,500 - 7,800 cells/uL   Lymphs Abs 2,324 850 - 3,900 cells/uL   Monocytes Absolute 498 200 - 950 cells/uL   Eosinophils Absolute 166 15 - 500 cells/uL   Basophils Absolute 0 0 - 200 cells/uL   Neutrophils Relative % 64 %   Lymphocytes Relative 28 %   Monocytes Relative 6 %   Eosinophils Relative 2 %   Basophils Relative 0 %   Smear Review Criteria for review not met   C-reactive protein     Status: None   Collection Time: 03/21/16  9:36 AM  Result Value Ref Range   CRP 0.3 <8.0 mg/L  Sedimentation rate     Status: None   Collection Time: 03/21/16  9:36 AM  Result Value Ref Range   Sed Rate 1 0 - 30 mm/hr  POC Hemoccult Bld/Stl (1-Cd Office Dx)     Status: Normal   Collection Time:  03/21/16  2:20 PM  Result Value Ref Range   Card #1 Date 03/21/2016    Fecal Occult Blood, POC Negative Negative     PHQ2/9: Depression screen [facility] 2/9 06/14/2016 03/21/2016 12/29/2015 04/20/2015 03/23/2015  Decreased Interest _0 0  Down, Depressed, Hopeless _1 0  PHQ - 2 Score _2 0  Altered sleeping _3 -  Tired, decreased energy - _4 -  Change in appetite _5 -  Feeling bad or failure about yourself  _6 -  Trouble  concentrating _0 -  Moving slowly or fidgety/restless _1 0 -  Suicidal thoughts 0 0 0 0 -  PHQ-9 Score _2 -  Difficult doing work/chores Very difficult Very difficult Very difficult - -     Fall Risk: Fall Risk  06/14/2016 03/21/2016 12/29/2015 09/29/2015 04/20/2015  Falls in the past year? Yes Yes No No No  Number falls in past yr: 1 2 or more - - -  Injury with Fall? Yes Yes - - -      Functional Status Survey: Is the patient deaf or have difficulty hearing?: No Does the patient have difficulty seeing, even when wearing glasses/contacts?: No Does the patient have difficulty concentrating, remembering, or making decisions?: No Does the patient have difficulty walking or climbing stairs?: No Does the patient have difficulty dressing or bathing?: No Does the patient have difficulty doing errands alone such as visiting a doctor's office or shopping?: Yes (Does not drive)    Assessment & Plan  1. Weight loss  Multiple labs done, off Remeron, likely from psychiatric disorder  2. Skin lesion of right arm   on right arm, discussed referral to Dermatologist or return for biopsy and she wants to return for biopsy  3. COPD, severe ([place])  - Ipratropium-Albuterol (COMBIVENT RESPIMAT) 20-100 MCG/ACT AERS respimat; Inhale 1 puff into the lungs every 6 (six) hours.  Dispense: 1 Inhaler; Refill: 0  4. Pre-diabetes  She has lost weight.   5. Dyslipidemia  - atorvastatin (LIPITOR) 40 MG tablet; TAKE 1 TABLET(40 MG) BY MOUTH  DAILY  Dispense: 90 tablet; Refill: 1  6. Senile purpura (HCC)  Stable  7. Chronic recurrent major depressive disorder [facility])  Recently admitted to [facility], discharged end of April, advised follow up with Dr. Clarice Pole. She is off clonazepam and morphine since admission, advised to pick up rx of Remeron given by [facility] until her follow up with psychiatrist   8. CVA, old, hemiparesis ([place])  - atorvastatin (LIPITOR) 40 MG tablet; TAKE 1 TABLET(40 MG) BY MOUTH DAILY  Dispense: 90 tablet; Refill: 1  9. Benign hypertension  - metoprolol succinate (TOPROL-XL) 25 MG 24 hr tablet; Take 1 tablet (25 mg total) by mouth daily.  Dispense: 90 tablet; Refill: 1

## 2016-07-01 ENCOUNTER — Encounter: Payer: Self-pay | Admitting: Psychiatry

## 2016-07-01 ENCOUNTER — Ambulatory Visit (INDEPENDENT_AMBULATORY_CARE_PROVIDER_SITE_OTHER): Payer: Medicare HMO | Admitting: Psychiatry

## 2016-07-01 VITALS — BP 123/72 | HR 81 | Temp 98.8°F | Wt 122.2 lb

## 2016-07-01 DIAGNOSIS — F1394 Sedative, hypnotic or anxiolytic use, unspecified with sedative, hypnotic or anxiolytic-induced mood disorder: Secondary | ICD-10-CM

## 2016-07-01 DIAGNOSIS — F331 Major depressive disorder, recurrent, moderate: Secondary | ICD-10-CM | POA: Diagnosis not present

## 2016-07-01 DIAGNOSIS — R69 Illness, unspecified: Secondary | ICD-10-CM | POA: Diagnosis not present

## 2016-07-01 MED ORDER — MIRTAZAPINE 30 MG PO TABS: 30.0000 mg | ORAL_TABLET | Freq: Every day | ORAL | 1 refills | Status: DC

## 2016-07-01 MED ORDER — TRAZODONE HCL 50 MG PO TABS: 25.0000 mg | ORAL_TABLET | Freq: Every evening | ORAL | 1 refills | Status: DC

## 2016-07-01 MED ORDER — GABAPENTIN 300 MG PO CAPS: 300.0000 mg | ORAL_CAPSULE | Freq: Two times a day (BID) | ORAL | 1 refills | Status: DC
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
## 2016-07-12 ENCOUNTER — Emergency Department
Admission: EM | Admit: 2016-07-12 | Discharge: 2016-07-14 | Disposition: A | Discharge status: Other Acute Inpatient Hospital | Payer: Medicare HMO | Attending: Emergency Medicine | Admitting: Emergency Medicine

## 2016-07-12 ENCOUNTER — Emergency Department: Payer: Medicare HMO

## 2016-07-12 ENCOUNTER — Encounter: Payer: Self-pay | Admitting: Emergency Medicine

## 2016-07-12 DIAGNOSIS — J45909 Unspecified asthma, uncomplicated: Secondary | ICD-10-CM | POA: Diagnosis not present

## 2016-07-12 DIAGNOSIS — Z8673 Personal history of transient ischemic attack (TIA), and cerebral infarction without residual deficits: Secondary | ICD-10-CM | POA: Insufficient documentation

## 2016-07-12 DIAGNOSIS — Z79899 Other long term (current) drug therapy: Secondary | ICD-10-CM | POA: Diagnosis not present

## 2016-07-12 DIAGNOSIS — Z87891 Personal history of nicotine dependence: Secondary | ICD-10-CM | POA: Insufficient documentation

## 2016-07-12 DIAGNOSIS — R110 Nausea: Secondary | ICD-10-CM | POA: Diagnosis not present

## 2016-07-12 DIAGNOSIS — J449 Chronic obstructive pulmonary disease, unspecified: Secondary | ICD-10-CM | POA: Insufficient documentation

## 2016-07-12 DIAGNOSIS — Z7982 Long term (current) use of aspirin: Secondary | ICD-10-CM | POA: Insufficient documentation

## 2016-07-12 DIAGNOSIS — F339 Major depressive disorder, recurrent, unspecified: Secondary | ICD-10-CM

## 2016-07-12 DIAGNOSIS — R1084 Generalized abdominal pain: Secondary | ICD-10-CM | POA: Diagnosis not present

## 2016-07-12 DIAGNOSIS — R531 Weakness: Secondary | ICD-10-CM | POA: Diagnosis present

## 2016-07-12 DIAGNOSIS — Z7902 Long term (current) use of antithrombotics/antiplatelets: Secondary | ICD-10-CM | POA: Diagnosis not present

## 2016-07-12 DIAGNOSIS — R0602 Shortness of breath: Secondary | ICD-10-CM | POA: Diagnosis not present

## 2016-07-12 DIAGNOSIS — I10 Essential (primary) hypertension: Secondary | ICD-10-CM | POA: Diagnosis not present

## 2016-07-12 DIAGNOSIS — R079 Chest pain, unspecified: Secondary | ICD-10-CM | POA: Diagnosis not present

## 2016-07-12 DIAGNOSIS — R8271 Bacteriuria: Secondary | ICD-10-CM | POA: Diagnosis not present

## 2016-07-12 DIAGNOSIS — R69 Illness, unspecified: Secondary | ICD-10-CM | POA: Diagnosis not present

## 2016-07-12 LAB — CBC
HEMATOCRIT: 37.8 % (ref 35.0–47.0)
Hemoglobin: 13 g/dL (ref 12.0–16.0)
MCH: 31.8 pg (ref 26.0–34.0)
MCHC: 34.4 g/dL (ref 32.0–36.0)
MCV: 92.4 fL (ref 80.0–100.0)
Platelets: 203 10*3/uL (ref 150–440)
RBC: 4.1 MIL/uL (ref 3.80–5.20)
RDW: 14.5 % (ref 11.5–14.5)
WBC: 7.9 10*3/uL (ref 3.6–11.0)

## 2016-07-12 LAB — BASIC METABOLIC PANEL
Anion gap: 6 (ref 5–15)
BUN: 10 mg/dL (ref 6–20)
CHLORIDE: 111 mmol/L (ref 101–111)
CO2: 25 mmol/L (ref 22–32)
Calcium: 9.2 mg/dL (ref 8.9–10.3)
Creatinine, Ser: 0.67 mg/dL (ref 0.44–1.00)
GFR calc non Af Amer: >60 mL/min (ref >60)
Glucose, Bld: 101 mg/dL — ABNORMAL HIGH (ref 65–99)
POTASSIUM: 4 mmol/L (ref 3.5–5.1)
SODIUM: 142 mmol/L (ref 135–145)

## 2016-07-12 LAB — TROPONIN I: Troponin I: <0.03 ng/mL (ref <0.03)

## 2016-07-12 MED ORDER — SODIUM CHLORIDE 0.9 % IV SOLN
1000.0000 mL | Freq: Once | INTRAVENOUS | Status: AC
  Administered 2016-07-12: 1000 mL via INTRAVENOUS

## 2016-07-12 MED ORDER — ONDANSETRON HCL 4 MG/2ML IJ SOLN
4.0000 mg | Freq: Once | INTRAMUSCULAR | Status: AC
  Administered 2016-07-12: 4 mg via INTRAVENOUS
  Filled 2016-07-12: qty 2

## 2016-07-12 NOTE — ED Provider Notes (Signed)
[facility] Emergency Department Provider Note   ____________________________________________    I have reviewed the triage vital signs and the nursing notes.   HISTORY  Chief Complaint Weakness    HPI LAURENA [name] is a 71 y.o. female who presents with multiple complaints, primarily she complains of weakness. Patient reports she was recently in [place] for nearly a month for depression and anxiety and she reports since then she has no strength, she has felt nauseous and is not eating well. Patient is tearful and complaining of multiple things including "tailbone pain". However her primary concern is her weakness. Her husband is with her and is primarily concerned about how long this will take. Patient denies chest pain to me   Past Medical History:  Diagnosis Date  . Allergy   . Anxiety   . Asthma   . COPD (chronic obstructive pulmonary disease) ([place])   . CVA (cerebral infarction)   . Depression   . Fibromyalgia   . Headache   . Hyperlipidemia   . Hypertension   . IBS (irritable bowel syndrome)   . Stroke ([place])   . Vitamin D deficiency     Patient Active Problem List   Diagnosis Date Noted  . Seasonal allergic rhinitis 03/23/2015  . Hyperglycemia 03/23/2015  . Senile purpura ([place]) 03/23/2015  . Perennial allergic rhinitis with seasonal variation 03/23/2015  . Marital problems 10/31/2014  . Migraine without aura and without status migrainosus, not intractable 10/31/2014  . Chronic LBP 08/09/2014  . Colon polyp 08/09/2014  . COPD, severe ([place]) 08/09/2014  . CVA, old, hemiparesis ([place]) 08/09/2014  . Dyslipidemia 08/09/2014  . Dysfunction of eustachian tube 08/09/2014  . Fibromyalgia syndrome 08/09/2014  . Gastro-esophageal reflux disease without esophagitis 08/09/2014  . Benign migrating glossitis 08/09/2014  . Cerebrovascular accident, old 08/09/2014  . IBS (irritable bowel syndrome) 08/09/2014  . Low back pain with radiation  08/09/2014  . Chronic recurrent major depressive disorder ([place]) 08/09/2014  . Dysmetabolic syndrome 95/18/8416  . OP (osteoporosis) 08/09/2014  . Vitamin D deficiency 08/09/2014  . Benign hypertension 07/19/2013  . Benign neoplasm of skin of trunk 06/03/2013  . H/O: pneumonia 09/25/2012    Past Surgical History:  Procedure Laterality Date  . ABDOMINAL HYSTERECTOMY    . APPENDECTOMY    . BREAST SURGERY  2011   biopsy  . CERVICAL DISCECTOMY    . CHOLECYSTECTOMY    . SINUSOTOMY      Prior to Admission medications   Medication Sig Start Date End Date Taking? Authorizing Provider  aspirin 81 MG tablet Take 1 tablet by mouth daily.    [provider]  atorvastatin (LIPITOR) 40 MG tablet TAKE 1 TABLET(40 MG) BY MOUTH DAILY 06/14/16   Ancil Boozer, Drue Stager, MD  azelastine (ASTELIN) 0.1 % nasal spray Place 1 spray into both nostrils 2 (two) times daily. Use in each nostril as directed 06/14/16   Steele Sizer, MD  fluticasone (FLONASE) 50 MCG/ACT nasal spray Place 2 sprays into both nostrils daily. 09/29/15   Sowles, Drue Stager, MD  fluticasone furoate-vilanterol (BREO ELLIPTA) 100-25 MCG/INH AEPB Inhale 1 puff into the lungs daily. 03/21/16   Steele Sizer, MD  gabapentin (NEURONTIN) 300 MG capsule Take 1 capsule (300 mg total) by mouth 2 (two) times daily. 07/01/16   Rainey Pines, MD  Ipratropium-Albuterol (COMBIVENT RESPIMAT) 20-100 MCG/ACT AERS respimat Inhale 1 puff into the lungs every 6 (six) hours. 06/14/16   Steele Sizer, MD  loratadine (CLARITIN) 10 MG tablet TAKE  1 TABLET(10 MG) BY MOUTH DAILY 07/02/15   Steele Sizer, MD  metoprolol succinate (TOPROL-XL) 25 MG 24 hr tablet Take 1 tablet (25 mg total) by mouth daily. 06/14/16   Steele Sizer, MD  mirtazapine (REMERON) 30 MG tablet Take 1 tablet (30 mg total) by mouth daily. 07/01/16   Rainey Pines, MD  omeprazole (PRILOSEC) 40 MG capsule Take 1 capsule (40 mg total) by mouth daily. 03/21/16   Steele Sizer, MD  ondansetron (ZOFRAN  ODT) 4 MG disintegrating tablet Take 1 tablet (4 mg total) by mouth every 8 (eight) hours as needed for nausea or vomiting. 02/09/16   Nena Polio, MD  Tiotropium Bromide Monohydrate (SPIRIVA RESPIMAT) 1.25 MCG/ACT AERS Inhale 1 puff into the lungs daily. 03/21/16   Steele Sizer, MD  traZODone (DESYREL) 50 MG tablet Take 0.5 tablets (25 mg total) by mouth every evening. 07/01/16   Rainey Pines, MD     Allergies Bextra  [valdecoxib]; Compazine  [prochlorperazine edisylate]; Lithium carbonate; and Lyrica [pregabalin]  Family History  Problem Relation Age of Onset  . Anxiety disorder Mother   . Depression Mother   . Cancer Father   . Gallbladder disease Father   . Alcohol abuse Father   . Depression Father     Social History Social History  Substance Use Topics  . Smoking status: Former Smoker    Packs/day: 1.00    Years: 35.00    Types: Cigarettes    Quit date: 01/29/2012  . Smokeless tobacco: Never Used  . Alcohol use No    Review of Systems  Constitutional: No fever/chills Eyes: No visual changes.  ENT: No sore throat. Cardiovascular: Denies chest pain. Respiratory: Denies shortness of breath. Gastrointestinal: No abdominal pain.  No nausea, no vomiting.   Genitourinary: Negative for dysuria. Musculoskeletal: Negative for back pain. Skin: Negative for rash. Neurological: Negative for headaches or weakness   ____________________________________________   PHYSICAL EXAM:  VITAL SIGNS: ED Triage Vitals [07/12/16 1617]  Enc Vitals Group     BP (!) 105/56     Pulse Rate 82     Resp 18     Temp 97.5 F (36.4 C)     Temp Source Oral     SpO2 100 %     Weight 55.3 kg (122 lb)     Height      Head Circumference      Peak Flow      Pain Score 8     Pain Loc      Pain Edu?      Excl. in [place]?     Constitutional: Alert and oriented.Does not make significant eye contact. Tearful depressed affect Eyes: Conjunctivae are normal.  Head: Atraumatic.  Mouth/Throat:  Mucous membranes are moist.    Cardiovascular: Normal rate, regular rhythm. Grossly normal heart sounds.  Good peripheral circulation. Respiratory: Normal respiratory effort.  No retractions. Lungs CTAB. Gastrointestinal: Soft and nontender. No distention.   Genitourinary: deferred Musculoskeletal: No lower extremity tenderness nor edema.  Warm and well perfused Neurologic:  Normal speech and language. No gross focal neurologic deficits are appreciated.  Skin:  Skin is warm, dry and intact. No rash noted. Psychiatric: Mood and affect are depressed and flat, speech is normal. Patient is tearful, denies SI or HI  ____________________________________________   LABS (all labs ordered are listed, but only abnormal results are displayed)  Labs Reviewed  BASIC METABOLIC PANEL - Abnormal; Notable for the following:       Result Value  Glucose, Bld 101 (*)    All other components within normal limits  CBC  TROPONIN I   ____________________________________________  EKG  ED ECG REPORT I, Lavonia Drafts, the attending physician, personally viewed and interpreted this ECG.  Date: 07/12/2016  Rhythm: normal sinus rhythm QRS Axis: normal Intervals: normal ST/T Wave abnormalities: normal Narrative Interpretation: unremarkable  ____________________________________________  RADIOLOGY  Chest x-ray is no acute distress ____________________________________________   PROCEDURES  Procedure(s) performed: No    Critical Care performed: No ____________________________________________   INITIAL IMPRESSION / ASSESSMENT AND PLAN / ED COURSE  Pertinent labs & imaging results that were available during my care of the patient were reviewed by me and considered in my medical decision making (see chart for details).  Patient presents primarily with weakness status post hospitalization. Lab work is unremarkable, EKG and chest x-ray are reassuring. We will treat with IV fluids and Zofran  given her complaints of mild nausea. Suspect her presentation is a manifestation of chronic depression   ----------------------------------------- 7:52 PM on 07/12/2016 -----------------------------------------  Martin Majestic to check on how patient is feeling. Her husband is no longer there. She states no significant change and that "she wishes she would just die"  Given this I will move her to an appropriate room and consult telepsych for evaluation. Do not believe involuntary commit is necessary as the patient is willing to stay    ____________________________________________   FINAL CLINICAL IMPRESSION(S) / ED DIAGNOSES  Final diagnoses:  Recurrent major depressive disorder, remission status unspecified ([place])      NEW MEDICATIONS STARTED DURING THIS VISIT:  New Prescriptions   No medications on file     Note:  This document was prepared using Dragon voice recognition software and may include unintentional dictation errors.    Lavonia Drafts, MD 07/12/16 [phone]

## 2016-07-12 NOTE — ED Notes (Signed)
Patient ambulated to bathroom. Patient complaining of back, chest and pelvic pain. When asked pain scale patient would not give this RN a number her response was "i don't know."

## 2016-07-12 NOTE — ED Triage Notes (Signed)
Pt to ed with c/o chest pain over the last few days, constant pain, reports sob, weakness, dizziness, and nausea with pain.  Rates 8/10, ekg done on arrival.

## 2016-07-12 NOTE — ED Notes (Signed)
Dressed pt out in purple scrubs.  IV removed with catheter intact.  Pt tearful stating she wished she never came here.  Belongings bagged, labelled and locked up.  Black purse with cup of jewlery placed inside purse as per pts request.  4 silver colored rings with 1 large stone apiece.  Bag 2 is shoes, knee highs, pants, underwear, top, sports bra.   Pt has dentures on her possession, as per her request.  Denture cup provided to pt. Pt ambulated to room 23

## 2016-07-13 LAB — URINALYSIS, ROUTINE W REFLEX MICROSCOPIC
Bilirubin Urine: NEGATIVE
Glucose, UA: NEGATIVE mg/dL
Hgb urine dipstick: NEGATIVE
Ketones, ur: 20 mg/dL — AB
Nitrite: NEGATIVE
PH: 6 (ref 5.0–8.0)
Protein, ur: NEGATIVE mg/dL
SPECIFIC GRAVITY, URINE: 1.014 (ref 1.005–1.030)

## 2016-07-13 LAB — URINE DRUG SCREEN, QUALITATIVE (ARMC ONLY)
Amphetamines, Ur Screen: NOT DETECTED
Barbiturates, Ur Screen: NOT DETECTED
Benzodiazepine, Ur Scrn: NOT DETECTED
Cannabinoid 50 Ng, Ur ~~LOC~~: NOT DETECTED
Cocaine Metabolite,Ur ~~LOC~~: NOT DETECTED
MDMA (Ecstasy)Ur Screen: NOT DETECTED
Methadone Scn, Ur: NOT DETECTED
Opiate, Ur Screen: NOT DETECTED
Phencyclidine (PCP) Ur S: NOT DETECTED
Tricyclic, Ur Screen: NOT DETECTED

## 2016-07-13 MED ORDER — PANTOPRAZOLE SODIUM 40 MG PO TBEC
40.0000 mg | DELAYED_RELEASE_TABLET | Freq: Every day | ORAL | Status: DC
  Administered 2016-07-13 – 2016-07-14 (×2): 40 mg via ORAL
  Filled 2016-07-13 (×2): qty 1

## 2016-07-13 MED ORDER — FLUTICASONE FUROATE-VILANTEROL 100-25 MCG/INH IN AEPB
1.0000 | INHALATION_SPRAY | Freq: Every day | RESPIRATORY_TRACT | Status: DC
  Filled 2016-07-13: qty 28

## 2016-07-13 MED ORDER — ASPIRIN 81 MG PO CHEW
81.0000 mg | CHEWABLE_TABLET | Freq: Every day | ORAL | Status: DC
  Administered 2016-07-13 – 2016-07-14 (×2): 81 mg via ORAL
  Filled 2016-07-13 (×2): qty 1

## 2016-07-13 MED ORDER — IPRATROPIUM-ALBUTEROL 0.5-2.5 (3) MG/3ML IN SOLN
3.0000 mL | Freq: Four times a day (QID) | RESPIRATORY_TRACT | Status: DC
  Administered 2016-07-13 – 2016-07-14 (×4): 3 mL via RESPIRATORY_TRACT
  Filled 2016-07-13 (×9): qty 3

## 2016-07-13 MED ORDER — IPRATROPIUM-ALBUTEROL 20-100 MCG/ACT IN AERS: 1.0000 | INHALATION_SPRAY | Freq: Four times a day (QID) | RESPIRATORY_TRACT | Status: DC

## 2016-07-13 MED ORDER — ONDANSETRON 4 MG PO TBDP
4.0000 mg | ORAL_TABLET | Freq: Three times a day (TID) | ORAL | Status: DC | PRN
  Administered 2016-07-13 (×2): 4 mg via ORAL
  Filled 2016-07-13: qty 1

## 2016-07-13 MED ORDER — MIRTAZAPINE 15 MG PO TABS
30.0000 mg | ORAL_TABLET | Freq: Every day | ORAL | Status: DC
  Administered 2016-07-13: 30 mg via ORAL
  Filled 2016-07-13: qty 2

## 2016-07-13 MED ORDER — FLUTICASONE PROPIONATE 50 MCG/ACT NA SUSP
2.0000 | Freq: Every day | NASAL | Status: DC
  Filled 2016-07-13: qty 16

## 2016-07-13 MED ORDER — ONDANSETRON 4 MG PO TBDP
ORAL_TABLET | ORAL | Status: AC
  Filled 2016-07-13: qty 1

## 2016-07-13 MED ORDER — METOPROLOL SUCCINATE ER 50 MG PO TB24
25.0000 mg | ORAL_TABLET | Freq: Every day | ORAL | Status: DC
  Administered 2016-07-13 – 2016-07-14 (×2): 25 mg via ORAL
  Filled 2016-07-13 (×2): qty 1

## 2016-07-13 MED ORDER — TIOTROPIUM BROMIDE MONOHYDRATE 18 MCG IN CAPS
1.0000 | ORAL_CAPSULE | Freq: Every day | RESPIRATORY_TRACT | Status: DC
Start: 2016-07-13 — End: 2016-07-14
  Filled 2016-07-13: qty 5

## 2016-07-13 MED ORDER — ACETAMINOPHEN 325 MG PO TABS
650.0000 mg | ORAL_TABLET | Freq: Four times a day (QID) | ORAL | Status: DC | PRN
  Administered 2016-07-13: 650 mg via ORAL
  Filled 2016-07-13: qty 2

## 2016-07-13 MED ORDER — GABAPENTIN 300 MG PO CAPS
300.0000 mg | ORAL_CAPSULE | Freq: Two times a day (BID) | ORAL | Status: DC
  Administered 2016-07-13 – 2016-07-14 (×3): 300 mg via ORAL
  Filled 2016-07-13 (×3): qty 1

## 2016-07-13 MED ORDER — TRAZODONE HCL 50 MG PO TABS
25.0000 mg | ORAL_TABLET | Freq: Every evening | ORAL | Status: DC
  Administered 2016-07-13: 25 mg via ORAL
  Filled 2016-07-13: qty 1

## 2016-07-13 MED ORDER — LORATADINE 10 MG PO TABS
10.0000 mg | ORAL_TABLET | Freq: Every day | ORAL | Status: DC
  Administered 2016-07-13 – 2016-07-14 (×2): 10 mg via ORAL
  Filled 2016-07-13 (×2): qty 1

## 2016-07-13 MED ORDER — FOSFOMYCIN TROMETHAMINE 3 G PO PACK
3.0000 g | PACK | ORAL | Status: AC
  Administered 2016-07-13: 3 g via ORAL
  Filled 2016-07-13: qty 3

## 2016-07-13 MED ORDER — AZELASTINE HCL 0.1 % NA SOLN: 1.0000 | Freq: Two times a day (BID) | NASAL | Status: DC

## 2016-07-13 NOTE — BH Assessment (Signed)
Assessment Note  Beth [name] is an 71 y.o. female presenting to the ED with multiple physical complaints. Patient reports she was recently in [place] for nearly a month for depression and anxiety and she reports she has not been feeling well since being discharged.  Pt reportedly made a statement about wanting to die.    Pt currently denies SI/HI and states that she just want to go home.  She denies auditory/visual hallucinations.  Pt denies any drug/alcohol abuse.  Diagnosis: Major Depressive disorder  Past Medical History:  Past Medical History:  Diagnosis Date  . Allergy   . Anxiety   . Asthma   . COPD (chronic obstructive pulmonary disease) ([place])   . CVA (cerebral infarction)   . Depression   . Fibromyalgia   . Headache   . Hyperlipidemia   . Hypertension   . IBS (irritable bowel syndrome)   . Stroke ([place])   . Vitamin D deficiency     Past Surgical History:  Procedure Laterality Date  . ABDOMINAL HYSTERECTOMY    . APPENDECTOMY    . BREAST SURGERY  2011   biopsy  . CERVICAL DISCECTOMY    . CHOLECYSTECTOMY    . SINUSOTOMY      Family History:  Family History  Problem Relation Age of Onset  . Anxiety disorder Mother   . Depression Mother   . Cancer Father   . Gallbladder disease Father   . Alcohol abuse Father   . Depression Father     Social History:  reports that she quit smoking about 4 years ago. Her smoking use included Cigarettes. She has a 35.00 pack-year smoking history. She has never used smokeless tobacco. She reports that she does not drink alcohol or use drugs.  Additional Social History:  Alcohol / Drug Use Pain Medications: See PTA Prescriptions: See PTA Over the Counter: See PTA History of alcohol / drug use?: No history of alcohol / drug abuse  CIWA: CIWA-Ar BP: 105/73 Pulse Rate: 67 COWS:    Allergies:  Allergies  Allergen Reactions  . Bextra  [Valdecoxib]   . Compazine  [Prochlorperazine Edisylate]   . Lithium Carbonate    . Lyrica [Pregabalin]     Home Medications:  (Not in a hospital admission)  OB/GYN Status:  No LMP recorded. Patient has had a hysterectomy.  General Assessment Data Location of Assessment: [facility] ED TTS Assessment: In system Is this a Tele or Face-to-Face Assessment?: Face-to-Face Is this an Initial Assessment or a Re-assessment for this encounter?: Initial Assessment Marital status: Married Parks name: na Is patient pregnant?: No Pregnancy Status: No Living Arrangements: Spouse/significant other Can pt return to current living arrangement?: Yes Admission Status: Voluntary Is patient capable of signing voluntary admission?: Yes Referral Source: Self/Family/Friend Insurance type: Government social research officer     Crisis Care Plan Living Arrangements: Spouse/significant other Legal Guardian: Other: (self) [facility] of Psychiatrist: [facility] Name of Therapist: [facility]  Education Status Is patient currently in school?: No Current Grade: na Highest grade of school patient has completed: na Name of school: na Contact person: na  Risk to self with the past 6 months Suicidal Ideation: Yes-Currently Present Has patient been a risk to self within the past 6 months prior to admission? : No Suicidal Intent: No Has patient had any suicidal intent within the past 6 months prior to admission? : No Is patient at risk for suicide?: No Suicidal Plan?: No Has patient had any suicidal plan within the past 6 months prior to  admission? : No Access to Means: No What has been your use of drugs/alcohol within the last 12 months?: Pt denies drug/alcohol use Previous Attempts/Gestures: No Other Self Harm Risks: None identified Triggers for Past Attempts: None known Intentional Self Injurious Behavior: None Family Suicide History: No Recent stressful life event(s): Other (Comment) Persecutory voices/beliefs?: No Depression: Yes Depression Symptoms: Loss of interest in usual pleasures Substance abuse history  and/or treatment for substance abuse?: No Suicide prevention information given to non-admitted patients: Not applicable  Risk to Others within the past 6 months Homicidal Ideation: No Does patient have any lifetime risk of violence toward others beyond the six months prior to admission? : No Thoughts of Harm to Others: No Current Homicidal Intent: No Current Homicidal Plan: No Access to Homicidal Means: No Identified Victim: None identified History of harm to others?: No Assessment of Violence: None Noted Violent Behavior Description: None identified Does patient have access to weapons?: No Criminal Charges Pending?: No Does patient have a court date: No Is patient on probation?: No  Psychosis Hallucinations: None noted Delusions: None noted  Mental Status Report Appearance/Hygiene: In scrubs Eye Contact: Good Motor Activity: Restlessness Speech: Logical/coherent Level of Consciousness: Restless Mood: Anxious Affect: Anxious Anxiety Level: Minimal Thought Processes: Relevant Judgement: Unimpaired Orientation: Person, Place, Time, Situation Obsessive Compulsive Thoughts/Behaviors: None  Cognitive Functioning Concentration: Normal Memory: Recent Intact, Remote Intact IQ: Average Insight: Good Impulse Control: Good Appetite: Fair Sleep: No Change Vegetative Symptoms: None  ADLScreening [facility] Assessment Services) Patient's cognitive ability adequate to safely complete daily activities?: Yes Patient able to express need for assistance with ADLs?: Yes Independently performs ADLs?: Yes (appropriate for developmental age)  Prior Inpatient Therapy Prior Inpatient Therapy: Yes Prior Therapy Dates: 05/2016 Prior Therapy Facilty/Provider(s): [facility] Reason for Treatment: depression  Prior Outpatient Therapy Prior Outpatient Therapy: Yes Prior Therapy Dates: current Prior Therapy Facilty/Provider(s): Dr. Gretel Acre Reason for Treatment: depression Does patient have an ACCT  team?: No Does patient have Intensive In-House Services?  : No Does patient have [facility] services? : No Does patient have P4CC services?: No  ADL Screening (condition at time of admission) Patient's cognitive ability adequate to safely complete daily activities?: Yes Patient able to express need for assistance with ADLs?: Yes Independently performs ADLs?: Yes (appropriate for developmental age)       Abuse/Neglect Assessment (Assessment to be complete while patient is alone) Physical Abuse: Denies Verbal Abuse: Denies Sexual Abuse: Denies Exploitation of patient/patient's resources: Denies Self-Neglect: Denies Values / Beliefs Cultural Requests During Hospitalization: None Spiritual Requests During Hospitalization: None Consults Spiritual Care Consult Needed: No Social Work Consult Needed: No      Additional Information 1:1 In Past 12 Months?: No CIRT Risk: No Elopement Risk: No Does patient have medical clearance?: Yes     Disposition:  Disposition Initial Assessment Completed for this Encounter: Yes Disposition of Patient: Other dispositions Other disposition(s): Other (Comment) (To be seen by Telepsych)  On Site Evaluation by:   Reviewed with Physician:    Issis Lindseth C Delrick Dehart 07/13/2016 4:30 AM

## 2016-07-13 NOTE — ED Provider Notes (Addendum)
Patient urinalysis reveals multiple white cells, large leukocytes, and rare bacteria. I suspect a possible urinary tract infection, and we'll treat with fosfomycin. The patient does not have symptoms of pyelonephritis. No evidence of sepsis or overwhelming infection. Will continue to monitor in the ER/BHU. Final disposition pending from behavioral team/psych services.     Delman Kitten, MD 07/13/16 1157    Delman Kitten, MD 07/13/16 1158
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
## 2016-07-13 NOTE — BH Assessment (Signed)
Referral information for Psychiatric Hospitalization faxed to;    [facility] [phone]),    [facility] [phone]),    [facility] [phone])   [facility] [phone] or [phone]),    [facility] [phone]),    [facility] [phone]).

## 2016-07-13 NOTE — ED Notes (Signed)
Report was received from Lydia Guiles., RN; Pt. Verbalizes  complaints of requesting to leave; with stating, "I want to sign out AMA."; denies S.I./Hi.; refusing to eat; requesting pain medications;  Continue to monitor with 15 min. Monitoring.

## 2016-07-13 NOTE — ED Provider Notes (Signed)
-----------------------------------------   4:46 AM on 07/13/2016 -----------------------------------------  Patient was evaluated by [facility] psychiatrist Dr. Laurin Coder who recommends IVC and admission to inpatient psychiatry service. No new medication recommendations.   Paulette Blanch, MD 07/13/16 [phone]

## 2016-07-13 NOTE — ED Notes (Signed)
Patient transferred from ED to [facility] in wine colored scrubs. Pt wanded and oriented to unit. Patient currently denies SI/HI and A/V hallucinations. Pt made aware of upcoming transfer to other facility.

## 2016-07-13 NOTE — ED Provider Notes (Signed)
Patient accepted to [facility] for specialized psychiatric care   Lavonia Drafts, MD 07/13/16 [phone]

## 2016-07-13 NOTE — BH Assessment (Signed)
Received call from [facility], advising that there is an outbreak of c diff at their facility.  Patient's tranfer to their facility will have to be put on hold until further notice.  The intake representative call tomorrow with an update [phone]).  MD on duty is Dr. Evie Lacks.

## 2016-07-13 NOTE — ED Notes (Signed)
ED BHU PLACEMENT JUSTIFICATION Is the patient under IVC or is there intent for IVC: Yes.   Is the patient medically cleared: Yes.   Is there vacancy in the ED BHU: Yes.   Is the population mix appropriate for patient: Yes.   Is the patient awaiting placement in inpatient or outpatient setting: Yes.   Has the patient had a psychiatric consult: Yes.   Survey of unit performed for contraband, proper placement and condition of furniture, tampering with fixtures in bathroom, shower, and each patient room: Yes.   APPEARANCE/BEHAVIOR adequate rapport can be established NEURO ASSESSMENT Orientation: time, place and person Hallucinations: No.None noted (Hallucinations) Speech: Normal Gait: normal RESPIRATORY ASSESSMENT Normal expansion.  Clear to auscultation.  No rales, rhonchi, or wheezing. CARDIOVASCULAR ASSESSMENT regular rate and rhythm, S1, S2 normal, no murmur, click, rub or gallop GASTROINTESTINAL ASSESSMENT soft, nontender, BS WNL, no r/g EXTREMITIES normal strength, tone, and muscle mass PLAN OF CARE Provide calm/safe environment. Vital signs assessed twice daily. ED BHU Assessment once each 12-hour shift. Collaborate with intake RN daily or as condition indicates. Assure the ED provider has rounded once each shift. Provide and encourage hygiene. Provide redirection as needed. Assess for escalating behavior; address immediately and inform ED provider.  Assess family dynamic and appropriateness for visitation as needed: Yes.   Educate the patient/family about BHU procedures/visitation: Yes.   

## 2016-07-13 NOTE — ED Notes (Signed)
AAOx3.  Skin warm and dry. Ambulates with easy and steady gait.

## 2016-07-13 NOTE — BH Assessment (Signed)
Patient has been accepted to [facility].  Patient assigned to [facility], room 153-2  Accepting physician is Dr. Nadean Corwin.  Call report to [phone].  Representative was [employer].  ER Staff is aware of it Raquel Sarna, ER Sect.; Dr. Corky Downs, [place], Patient's Nurse).  Patient bed is available now. If she can not come before 11pm, she will have to come the following day (07/14/2016) after 6am.

## 2016-07-14 DIAGNOSIS — F411 Generalized anxiety disorder: Secondary | ICD-10-CM | POA: Diagnosis not present

## 2016-07-14 DIAGNOSIS — E785 Hyperlipidemia, unspecified: Secondary | ICD-10-CM | POA: Diagnosis not present

## 2016-07-14 DIAGNOSIS — G8929 Other chronic pain: Secondary | ICD-10-CM | POA: Diagnosis not present

## 2016-07-14 DIAGNOSIS — M549 Dorsalgia, unspecified: Secondary | ICD-10-CM | POA: Diagnosis not present

## 2016-07-14 DIAGNOSIS — I2510 Atherosclerotic heart disease of native coronary artery without angina pectoris: Secondary | ICD-10-CM | POA: Diagnosis not present

## 2016-07-14 DIAGNOSIS — F332 Major depressive disorder, recurrent severe without psychotic features: Secondary | ICD-10-CM | POA: Insufficient documentation

## 2016-07-14 DIAGNOSIS — M542 Cervicalgia: Secondary | ICD-10-CM | POA: Diagnosis not present

## 2016-07-14 DIAGNOSIS — F339 Major depressive disorder, recurrent, unspecified: Secondary | ICD-10-CM | POA: Diagnosis not present

## 2016-07-14 DIAGNOSIS — E876 Hypokalemia: Secondary | ICD-10-CM | POA: Diagnosis not present

## 2016-07-14 DIAGNOSIS — I10 Essential (primary) hypertension: Secondary | ICD-10-CM | POA: Diagnosis not present

## 2016-07-14 DIAGNOSIS — Z8673 Personal history of transient ischemic attack (TIA), and cerebral infarction without residual deficits: Secondary | ICD-10-CM | POA: Diagnosis not present

## 2016-07-14 DIAGNOSIS — J449 Chronic obstructive pulmonary disease, unspecified: Secondary | ICD-10-CM | POA: Diagnosis not present

## 2016-07-14 DIAGNOSIS — R69 Illness, unspecified: Secondary | ICD-10-CM | POA: Diagnosis not present

## 2016-07-14 NOTE — ED Provider Notes (Signed)
-----------------------------------------   6:29 AM on 07/14/2016 -----------------------------------------   Blood pressure 136/72, pulse 64, temperature 98 F (36.7 C), temperature source Oral, resp. rate 18, weight 55.3 kg (122 lb), SpO2 99 %.  The patient had no acute events since last update.  Sleeping at this time.  Patient was accepted to [facility] psychiatric facility. However, they called back stating they are currently having a C. difficile outbreak so patient's acceptance has been put on hold.   Paulette Blanch, MD 07/14/16 [phone]

## 2016-07-14 NOTE — ED Notes (Signed)
PT IVC/ PENDING PLACEMENT  

## 2016-07-14 NOTE — BH Assessment (Signed)
Writer followed up with [facility] ([phone]) and she stated the patient is able to come now. They have resolved the concern of the "C-Diff Outbreak."  Writer updated ER Sectary (April).

## 2016-07-14 NOTE — Progress Notes (Signed)
Report called and given to Beth Guiles RN at [facility]. Pt dressed in paper scrubs upon d/c. Pt denies si/hi/ah/vh prior to d/c. Pt is very angry when she found out she was being d/c.All belongings transferred with patient. No other issues voiced reported prior to d/c.

## 2016-07-14 NOTE — ED Notes (Signed)
Upon approaching pt. Pt was hitting her body against the wall. Pt stated, "she was doing this because she is mad and angry". Pt stated, "she is not suicidal and wants to go home" pt verbalized that this place is making her stressed out. Pt was offer calming distraction. Pt given soda as she request. Will cont to monitor pt.

## 2016-07-15 DIAGNOSIS — E876 Hypokalemia: Secondary | ICD-10-CM | POA: Diagnosis not present

## 2016-07-15 DIAGNOSIS — F411 Generalized anxiety disorder: Secondary | ICD-10-CM | POA: Diagnosis not present

## 2016-07-15 DIAGNOSIS — E785 Hyperlipidemia, unspecified: Secondary | ICD-10-CM | POA: Diagnosis not present

## 2016-07-15 DIAGNOSIS — M549 Dorsalgia, unspecified: Secondary | ICD-10-CM

## 2016-07-15 DIAGNOSIS — G8929 Other chronic pain: Secondary | ICD-10-CM

## 2016-07-15 DIAGNOSIS — J449 Chronic obstructive pulmonary disease, unspecified: Secondary | ICD-10-CM | POA: Diagnosis not present

## 2016-07-15 DIAGNOSIS — Z8673 Personal history of transient ischemic attack (TIA), and cerebral infarction without residual deficits: Secondary | ICD-10-CM | POA: Diagnosis not present

## 2016-07-15 DIAGNOSIS — I10 Essential (primary) hypertension: Secondary | ICD-10-CM | POA: Diagnosis not present

## 2016-07-15 DIAGNOSIS — M542 Cervicalgia: Secondary | ICD-10-CM

## 2016-07-15 DIAGNOSIS — R69 Illness, unspecified: Secondary | ICD-10-CM | POA: Diagnosis not present

## 2016-07-15 DIAGNOSIS — G894 Chronic pain syndrome: Secondary | ICD-10-CM | POA: Insufficient documentation

## 2016-07-15 LAB — BASIC METABOLIC PANEL
BUN: 8 (ref 4–21)
CREATININE: 0.7 (ref 0.5–1.1)
Glucose: 86
Potassium: 3.1 — AB (ref 3.4–5.3)
SODIUM: 146 (ref 137–147)

## 2016-07-15 LAB — URINE CULTURE: Special Requests: NORMAL

## 2016-07-15 LAB — HEPATIC FUNCTION PANEL
ALK PHOS: 57 (ref 25–125)
ALT: 13 (ref 7–35)
AST: 18 (ref 13–35)
BILIRUBIN, TOTAL: 0.3

## 2016-07-15 LAB — LIPID PANEL
Cholesterol: 86 (ref 0–200)
HDL: 33 — AB (ref 35–70)
LDL CALC: 37
TRIGLYCERIDES: 78 (ref 40–160)

## 2016-07-15 LAB — HEMOGLOBIN A1C: HEMOGLOBIN A1C: 5

## 2016-07-16 DIAGNOSIS — J449 Chronic obstructive pulmonary disease, unspecified: Secondary | ICD-10-CM | POA: Diagnosis not present

## 2016-07-16 DIAGNOSIS — M542 Cervicalgia: Secondary | ICD-10-CM | POA: Diagnosis not present

## 2016-07-16 DIAGNOSIS — R69 Illness, unspecified: Secondary | ICD-10-CM | POA: Diagnosis not present

## 2016-07-16 DIAGNOSIS — E785 Hyperlipidemia, unspecified: Secondary | ICD-10-CM | POA: Diagnosis not present

## 2016-07-16 DIAGNOSIS — M549 Dorsalgia, unspecified: Secondary | ICD-10-CM | POA: Diagnosis not present

## 2016-07-16 DIAGNOSIS — Z8673 Personal history of transient ischemic attack (TIA), and cerebral infarction without residual deficits: Secondary | ICD-10-CM | POA: Diagnosis not present

## 2016-07-16 DIAGNOSIS — E876 Hypokalemia: Secondary | ICD-10-CM | POA: Diagnosis not present

## 2016-07-16 DIAGNOSIS — I10 Essential (primary) hypertension: Secondary | ICD-10-CM | POA: Diagnosis not present

## 2016-07-16 DIAGNOSIS — F411 Generalized anxiety disorder: Secondary | ICD-10-CM | POA: Diagnosis not present

## 2016-07-16 LAB — BASIC METABOLIC PANEL
BUN: 7 (ref 4–21)
CREATININE: 0.8 (ref 0.5–1.1)
Glucose: 91
Potassium: 3.3 — AB (ref 3.4–5.3)
Sodium: 145 (ref 137–147)

## 2016-07-17 DIAGNOSIS — F411 Generalized anxiety disorder: Secondary | ICD-10-CM | POA: Diagnosis not present

## 2016-07-17 DIAGNOSIS — M542 Cervicalgia: Secondary | ICD-10-CM | POA: Diagnosis not present

## 2016-07-17 DIAGNOSIS — I10 Essential (primary) hypertension: Secondary | ICD-10-CM | POA: Diagnosis not present

## 2016-07-17 DIAGNOSIS — E876 Hypokalemia: Secondary | ICD-10-CM | POA: Diagnosis not present

## 2016-07-17 DIAGNOSIS — Z8673 Personal history of transient ischemic attack (TIA), and cerebral infarction without residual deficits: Secondary | ICD-10-CM | POA: Diagnosis not present

## 2016-07-17 DIAGNOSIS — E785 Hyperlipidemia, unspecified: Secondary | ICD-10-CM | POA: Diagnosis not present

## 2016-07-17 DIAGNOSIS — M549 Dorsalgia, unspecified: Secondary | ICD-10-CM | POA: Diagnosis not present

## 2016-07-17 DIAGNOSIS — R69 Illness, unspecified: Secondary | ICD-10-CM | POA: Diagnosis not present

## 2016-07-17 DIAGNOSIS — J449 Chronic obstructive pulmonary disease, unspecified: Secondary | ICD-10-CM | POA: Diagnosis not present

## 2016-07-18 DIAGNOSIS — M542 Cervicalgia: Secondary | ICD-10-CM | POA: Diagnosis not present

## 2016-07-18 DIAGNOSIS — F411 Generalized anxiety disorder: Secondary | ICD-10-CM | POA: Diagnosis not present

## 2016-07-18 DIAGNOSIS — E785 Hyperlipidemia, unspecified: Secondary | ICD-10-CM | POA: Diagnosis not present

## 2016-07-18 DIAGNOSIS — I10 Essential (primary) hypertension: Secondary | ICD-10-CM | POA: Diagnosis not present

## 2016-07-18 DIAGNOSIS — M549 Dorsalgia, unspecified: Secondary | ICD-10-CM | POA: Diagnosis not present

## 2016-07-18 DIAGNOSIS — E876 Hypokalemia: Secondary | ICD-10-CM | POA: Diagnosis not present

## 2016-07-18 DIAGNOSIS — R69 Illness, unspecified: Secondary | ICD-10-CM | POA: Diagnosis not present

## 2016-07-18 DIAGNOSIS — Z8673 Personal history of transient ischemic attack (TIA), and cerebral infarction without residual deficits: Secondary | ICD-10-CM | POA: Diagnosis not present

## 2016-07-18 DIAGNOSIS — J449 Chronic obstructive pulmonary disease, unspecified: Secondary | ICD-10-CM | POA: Diagnosis not present

## 2016-07-18 NOTE — Telephone Encounter (Signed)
pt was made an appt for 06-03-16 pt no showed.  pt was finally seen on  07-01-16 by dr. Gretel Acre.    Dr. Gretel Acre did not respond to this message.  Message was closed because pt was seen on  07-01-16.  No follow up appt was made.

## 2016-07-22 ENCOUNTER — Ambulatory Visit (INDEPENDENT_AMBULATORY_CARE_PROVIDER_SITE_OTHER): Payer: Medicare HMO | Admitting: Psychiatry

## 2016-07-22 ENCOUNTER — Encounter: Payer: Self-pay | Admitting: Psychiatry

## 2016-07-22 VITALS — BP 128/72 | HR 86 | Temp 98.1°F | Wt 112.4 lb

## 2016-07-22 DIAGNOSIS — F331 Major depressive disorder, recurrent, moderate: Secondary | ICD-10-CM

## 2016-07-22 DIAGNOSIS — F1394 Sedative, hypnotic or anxiolytic use, unspecified with sedative, hypnotic or anxiolytic-induced mood disorder: Secondary | ICD-10-CM

## 2016-07-22 DIAGNOSIS — R69 Illness, unspecified: Secondary | ICD-10-CM | POA: Diagnosis not present

## 2016-07-22 NOTE — Progress Notes (Signed)
Patient ID: Beth [name], female   DOB: 12/11/1945, 71 y.o.   MRN: [id number] [facility] MD/PA/NP OP Progress Note  07/22/2016 3:04 PM [name]  MRN:  [id number]   Subjective: Patient is a 71 year old married female with history of major depression and generalized anxiety disorder presented for follow-up.She reported that she was recently discharged from the [facility] know what hospital where she was admitted and discharged on June 21. She reported that she came to the ER as she was having a panic attack and was seen by the psychiatrist who suggested that she needs to be sent to the psychiatric facility. She reported that her husband remains supportive. Patient stated that now she is unable to fill her prescriptions as she does not have any money. She brought a folder with her which was given to her at the time of discharge from the facility 4 days ago. She currently denied having any suicidal ideations or plans. She reported that she is looking for assisted living facility for herself and her husband as they're unable to afford to live by themselves. Her son lives with his wife and he is currently taking lithium. She is concerned about him as well. She currently denied having any suicidal homicidal ideations or plans.            Chief Complaint:  Chief Complaint    Follow-up; Medication Refill     Visit Diagnosis:     ICD-10-CM   1. MDD (major depressive disorder), recurrent episode, moderate (HCC) F33.1   2. Sedative, hypnotic or anxiolytic-induced mood disorder ([place]) F13.94     Past Medical History:  Past Medical History:  Diagnosis Date  . Allergy   . Anxiety   . Asthma   . COPD (chronic obstructive pulmonary disease) ([place])   . CVA (cerebral infarction)   . Depression   . Fibromyalgia   . Headache   . Hyperlipidemia   . Hypertension   . IBS (irritable bowel syndrome)   . Stroke ([place])   . Vitamin D deficiency     Past Surgical History:  Procedure Laterality Date  .  ABDOMINAL HYSTERECTOMY    . APPENDECTOMY    . BREAST SURGERY  2011   biopsy  . CERVICAL DISCECTOMY    . CHOLECYSTECTOMY    . SINUSOTOMY     Family History:  Family History  Problem Relation Age of Onset  . Anxiety disorder Mother   . Depression Mother   . Cancer Father   . Gallbladder disease Father   . Alcohol abuse Father   . Depression Father    Social History:  Social History   Social History  . Marital status: Married    Spouse name: N/A  . Number of children: N/A  . Years of education: N/A   Social History Main Topics  . Smoking status: Former Smoker    Packs/day: 1.00    Years: 35.00    Types: Cigarettes    Quit date: 01/29/2012  . Smokeless tobacco: Never Used  . Alcohol use No  . Drug use: No  . Sexual activity: No   Other Topics Concern  . None   Social History Narrative  . None   Additional History:   Assessment:   Musculoskeletal: Strength & Muscle Tone: within normal limits Gait & Station: Slow Patient leans: N/A  Psychiatric Specialty Exam: Medication Refill   Anxiety  Symptoms include nervous/anxious behavior. Patient reports no suicidal ideas. Insomnia: Stated the mirtazapine did help her with insomnia.  Review of Systems  Psychiatric/Behavioral: Negative for hallucinations, memory loss and suicidal ideas. Depression: he continues to have depression but does state the medications do help. The patient is nervous/anxious. Insomnia: Stated the mirtazapine did help her with insomnia.     Blood pressure 128/72, pulse 86, temperature 98.1 F (36.7 C), temperature source Oral, weight 112 lb 6.4 oz (51 kg).Body mass index is 18.7 kg/m.  General Appearance: Well Groomed  Eye Contact:  Good  Speech:  slow  Volume:  Decreased  Mood:  Anxious  Affect:  Congruent  Thought Process:  Linear  Orientation:  Full (Time, Place, and Person)  Thought Content:  Negative  Suicidal Thoughts:  No  Homicidal Thoughts:  No  Memory:  Immediate;    Good Recent;   Good Remote;   Good  Judgement:  Good  Insight:  Good  Psychomotor Activity:  Psychomotor Retardation  Concentration:  Good  Recall:  Good  Fund of Knowledge: Good  Language: Good  Akathisia:  Negative  Handed:  Right  AIMS (if indicated):  N/A  Assets:  Social Support  ADL's:  Intact  Cognition: WNL  Sleep: fair, improved with remeron   Is the patient at risk to self?  No. Has the patient been a risk to self in the past 6 months?  No. Has the patient been a risk to self within the distant past?  No. Is the patient a risk to others?  No. Has the patient been a risk to others in the past 6 months?  No. Has the patient been a risk to others within the distant past?  No.  Current Medications: Current Outpatient Prescriptions  Medication Sig Dispense Refill  . aspirin 81 MG chewable tablet Chew by mouth.    Marland Kitchen atorvastatin (LIPITOR) 40 MG tablet TAKE 1 TABLET(40 MG) BY MOUTH DAILY 90 tablet 1  . azelastine (ASTELIN) 0.1 % nasal spray Place 1 spray into both nostrils 2 (two) times daily. Use in each nostril as directed 30 mL 2  . fluticasone (FLONASE) 50 MCG/ACT nasal spray Place 2 sprays into both nostrils daily. 16 g 2  . fluticasone furoate-vilanterol (BREO ELLIPTA) 100-25 MCG/INH AEPB Inhale 1 puff into the lungs daily. 120 each 1  . gabapentin (NEURONTIN) 300 MG capsule Take 1 capsule (300 mg total) by mouth 2 (two) times daily. 60 capsule 1  . Ipratropium-Albuterol (COMBIVENT RESPIMAT) 20-100 MCG/ACT AERS respimat Inhale 1 puff into the lungs every 6 (six) hours. 1 Inhaler 0  . loratadine (CLARITIN) 10 MG tablet TAKE 1 TABLET(10 MG) BY MOUTH DAILY 30 tablet 5  . Melatonin 3 MG TABS Take by mouth.    . metoprolol succinate (TOPROL-XL) 25 MG 24 hr tablet Take 1 tablet (25 mg total) by mouth daily. 90 tablet 1  . mirtazapine (REMERON) 45 MG tablet Take by mouth.    . Multiple Vitamin (THERA) TABS Take by mouth.    Marland Kitchen omeprazole (PRILOSEC) 40 MG capsule Take 1  capsule (40 mg total) by mouth daily. 30 capsule 1  . ondansetron (ZOFRAN ODT) 4 MG disintegrating tablet Take 1 tablet (4 mg total) by mouth every 8 (eight) hours as needed for nausea or vomiting. 12 tablet 0  . Tiotropium Bromide Monohydrate (SPIRIVA RESPIMAT) 1.25 MCG/ACT AERS Inhale 1 puff into the lungs daily. 4 g 5  . venlafaxine XR (EFFEXOR-XR) 37.5 MG 24 hr capsule Take by mouth.     No current facility-administered medications for this visit.     Medical Decision Making:  Established Problem, Stable/Improving (1)  Treatment Plan Summary:Medication management and Plan   Continue Remeron 45 mg at bedtime  Continue her on Neurontin as prescribed Pt has all the prescriptions in her hand as she was recently discharged from the hospital.   Follow-up in 3 weeks      More than 50% of the time spent in psychoeducation, counseling and coordination of care.    This note was generated in part or whole with voice recognition software. Voice regonition is usually quite accurate but there are transcription errors that can and very often do occur. I apologize for any typographical errors that were not detected and corrected.   Rainey Pines, MD  07/22/2016, 3:04 PM

## 2016-08-07 ENCOUNTER — Ambulatory Visit (INDEPENDENT_AMBULATORY_CARE_PROVIDER_SITE_OTHER): Payer: Medicare HMO | Admitting: Family Medicine

## 2016-08-07 ENCOUNTER — Encounter: Payer: Self-pay | Admitting: Family Medicine

## 2016-08-07 VITALS — BP 148/76 | HR 97 | Temp 98.1°F | Resp 16 | Ht 65.0 in | Wt 110.4 lb

## 2016-08-07 DIAGNOSIS — I10 Essential (primary) hypertension: Secondary | ICD-10-CM

## 2016-08-07 DIAGNOSIS — D692 Other nonthrombocytopenic purpura: Secondary | ICD-10-CM

## 2016-08-07 DIAGNOSIS — E785 Hyperlipidemia, unspecified: Secondary | ICD-10-CM

## 2016-08-07 DIAGNOSIS — J449 Chronic obstructive pulmonary disease, unspecified: Secondary | ICD-10-CM

## 2016-08-07 DIAGNOSIS — R69 Illness, unspecified: Secondary | ICD-10-CM | POA: Diagnosis not present

## 2016-08-07 DIAGNOSIS — E876 Hypokalemia: Secondary | ICD-10-CM | POA: Diagnosis not present

## 2016-08-07 DIAGNOSIS — E441 Mild protein-calorie malnutrition: Secondary | ICD-10-CM

## 2016-08-07 DIAGNOSIS — F411 Generalized anxiety disorder: Secondary | ICD-10-CM

## 2016-08-07 DIAGNOSIS — F339 Major depressive disorder, recurrent, unspecified: Secondary | ICD-10-CM | POA: Diagnosis not present

## 2016-08-07 DIAGNOSIS — I69359 Hemiplegia and hemiparesis following cerebral infarction affecting unspecified side: Secondary | ICD-10-CM | POA: Diagnosis not present

## 2016-08-07 LAB — COMPLETE METABOLIC PANEL WITH GFR
ALT: 10 U/L (ref 6–29)
AST: 15 U/L (ref 10–35)
Albumin: 3.7 g/dL (ref 3.6–5.1)
Alkaline Phosphatase: 79 U/L (ref 33–130)
BILIRUBIN TOTAL: 0.5 mg/dL (ref 0.2–1.2)
BUN: 14 mg/dL (ref 7–25)
CALCIUM: 9.5 mg/dL (ref 8.6–10.4)
CHLORIDE: 104 mmol/L (ref 98–110)
CO2: 29 mmol/L (ref 20–31)
Creat: 0.72 mg/dL (ref 0.60–0.93)
GFR, EST NON AFRICAN AMERICAN: 84 mL/min (ref >=60)
Glucose, Bld: 93 mg/dL (ref 65–99)
Potassium: 3.9 mmol/L (ref 3.5–5.3)
Sodium: 142 mmol/L (ref 135–146)
TOTAL PROTEIN: 6.2 g/dL (ref 6.1–8.1)

## 2016-08-07 NOTE — Progress Notes (Signed)
Name: Beth [name]   MRN: [id number]    DOB: [birth year]   Date:08/07/2016       Progress Note  Subjective  Chief Complaint  Chief Complaint  Patient presents with  . Medication Refill    Has been very shaky lately  . COPD    Has not been good, SOB   . Hypertension    BP was high at the hospital and dizziness at home  . Metabolic Syndrome    HPI  Recent psychiatric admission: she states she went to [facility] 07/13/2016 while having a panic attack and admitted for suicide prevention at [facility]. She brought some labs and potassium was low and albumin also low prior to discharge, she has seen her psychiatrist , Dr. Sharla Kidney since discharge, she states she was not suicidal .   COPD severe: she has been using Breo most days and also Spiriva. She had quit smoking in 2014 but has been smoking again daily, half pack day, she states she resumed smoking because of depression. She has a chronic cough, most days, mild decrease in exercise tolerance, and occasional wheezing. No recent flares.   HTN: taking metoprolol for bp and migraine prevention. She states she gets dizzy in am's, however bp is still elevated.She has not been having any migraine episodes in months. She states she has been trying to drink more water now, she states dizziness is not improving, no vertigo. She also feels anxious because of tremors  History of CVA with left side hemiparesis: doing much better, very mild symptoms now. Taking aspirin and statin therapy, she is still slightly weak on left leg.   Major Depression recurrent: sees Dr. Maricela Curet . Had to be admitted to [facility] because worsening of Depression, stayed in [place] from 05/01/2016 until 05/24/2016, at [facility] health 07/13/2016-07/18/2016 she is still depressed, but denies suicidal thoughts or ideation  Metabolic Syndrome: she does not have DM, only metabolic syndrome. Last hgbA1C was 5.7%, down to 5.0 % during recent hospital stay. She denies polyphagia,  polydipsia or polyuria.  Dyslipidemia: low HDL, reviewed labs done at [facility] health to improve advised her to eat tree nuts ( pecans/pistachios/almonds ) four times weekly, eat fish two times weekly  and exercise  at least 150 minutes per week  Patient Active Problem List   Diagnosis Date Noted  . Hypokalemia 07/15/2016  . Hyperlipidemia 07/15/2016  . Generalized anxiety disorder 07/15/2016  . Essential hypertension 07/15/2016  . Chronic neck and back pain 07/15/2016  . Severe episode of recurrent major depressive disorder, without psychotic features ([place]) 07/14/2016  . Moderate benzodiazepine use disorder ([place]) 05/08/2016  . Major depressive disorder, recurrent, severe w/o psychotic behavior ([place]) 05/01/2016  . Anxiety and depression 05/01/2016  . Seasonal allergic rhinitis 03/23/2015  . Hyperglycemia 03/23/2015  . Senile purpura ([place]) 03/23/2015  . Perennial allergic rhinitis with seasonal variation 03/23/2015  . Marital problems 10/31/2014  . Migraine without aura and without status migrainosus, not intractable 10/31/2014  . Chronic LBP 08/09/2014  . Colon polyp 08/09/2014  . COPD, severe ([place]) 08/09/2014  . CVA, old, hemiparesis ([place]) 08/09/2014  . Dyslipidemia 08/09/2014  . Dysfunction of eustachian tube 08/09/2014  . Fibromyalgia syndrome 08/09/2014  . Gastro-esophageal reflux disease without esophagitis 08/09/2014  . Benign migrating glossitis 08/09/2014  . Cerebrovascular accident, old 08/09/2014  . IBS (irritable bowel syndrome) 08/09/2014  . Low back pain with radiation 08/09/2014  . Chronic recurrent major depressive disorder ([place]) 08/09/2014  . Dysmetabolic syndrome 64/15/8309  . OP (  osteoporosis) 08/09/2014  . Vitamin D deficiency 08/09/2014  . Benign hypertension 07/19/2013  . Benign neoplasm of skin of trunk 06/03/2013  . H/O: pneumonia 09/25/2012    Past Surgical History:  Procedure Laterality Date  . ABDOMINAL HYSTERECTOMY    . APPENDECTOMY    . BREAST  SURGERY  2011   biopsy  . CERVICAL DISCECTOMY    . CHOLECYSTECTOMY    . SINUSOTOMY      Family History  Problem Relation Age of Onset  . Anxiety disorder Mother   . Depression Mother   . Cancer Father   . Gallbladder disease Father   . Alcohol abuse Father   . Depression Father     Social History   Social History  . Marital status: Married    Spouse name: N/A  . Number of children: N/A  . Years of education: N/A   Occupational History  . Not on file.   Social History Main Topics  . Smoking status: Former Smoker    Packs/day: 1.00    Years: 35.00    Types: Cigarettes    Quit date: 01/29/2012  . Smokeless tobacco: Never Used  . Alcohol use No  . Drug use: No  . Sexual activity: No   Other Topics Concern  . Not on file   Social History Narrative  . No narrative on file     Current Outpatient Prescriptions:  .  aspirin 81 MG chewable tablet, Chew by mouth., Disp: , Rfl:  .  atorvastatin (LIPITOR) 40 MG tablet, TAKE 1 TABLET(40 MG) BY MOUTH DAILY, Disp: 90 tablet, Rfl: 1 .  azelastine (ASTELIN) 0.1 % nasal spray, Place 1 spray into both nostrils 2 (two) times daily. Use in each nostril as directed, Disp: 30 mL, Rfl: 2 .  fluticasone (FLONASE) 50 MCG/ACT nasal spray, Place 2 sprays into both nostrils daily., Disp: 16 g, Rfl: 2 .  fluticasone furoate-vilanterol (BREO ELLIPTA) 100-25 MCG/INH AEPB, Inhale 1 puff into the lungs daily., Disp: 120 each, Rfl: 1 .  Ipratropium-Albuterol (COMBIVENT RESPIMAT) 20-100 MCG/ACT AERS respimat, Inhale 1 puff into the lungs every 6 (six) hours., Disp: 1 Inhaler, Rfl: 0 .  Melatonin 3 MG TABS, Take by mouth., Disp: , Rfl:  .  metoprolol succinate (TOPROL-XL) 25 MG 24 hr tablet, Take 1 tablet (25 mg total) by mouth daily., Disp: 90 tablet, Rfl: 1 .  mirtazapine (REMERON) 45 MG tablet, Take by mouth., Disp: , Rfl:  .  Multiple Vitamin (THERA) TABS, Take by mouth., Disp: , Rfl:  .  omeprazole (PRILOSEC) 40 MG capsule, Take 1 capsule (40  mg total) by mouth daily., Disp: 30 capsule, Rfl: 1 .  venlafaxine XR (EFFEXOR-XR) 37.5 MG 24 hr capsule, Take by mouth., Disp: , Rfl:   Allergies  Allergen Reactions  . Bextra  [Valdecoxib]   . Compazine  [Prochlorperazine Edisylate]   . Lithium Carbonate   . Lyrica [Pregabalin]      ROS  Constitutional: Negative for fever or significant weight change.  Respiratory: Positive for cough and shortness of breath.   Cardiovascular: Negative for chest pain or palpitations.  Gastrointestinal: Negative for abdominal pain, no bowel changes.  Musculoskeletal: Positive  for gait problem but no joint swelling.  Skin: Negative for rash.  Neurological: Positive  for dizziness and headache.  No other specific complaints in a complete review of systems (except as listed in HPI above).  Objective  Vitals:   08/07/16 1510  BP: (!) 148/76  Pulse: 97  Resp: 16  Temp: 98.1 F (36.7 C)  TempSrc: Oral  SpO2: 96%  Weight: 110 lb 6.4 oz (50.1 kg)  Height: '5\' 5"'  (1.651 m)    Body mass index is 18.37 kg/m.  Physical Exam  Constitutional: Patient appears well-developed Very thin.  No distress.  HEENT: head atraumatic, normocephalic, pupils equal and reactive to light, neck supple, throat within normal limits Cardiovascular: Normal rate, regular rhythm and normal heart sounds.  No murmur heard. No BLE edema. Pulmonary/Chest: Effort normal and breath sounds normal. No respiratory distress. Abdominal: Soft.  There is no tenderness. Skin:ecchymosis on both upper extremities Psychiatric: Patient seems anxious, very worried about chronic tremors Neurological: tremors present during movement and when focused on it, otherwise no tremors.   Recent Results (from the past 2160 hour(s))  Basic metabolic panel     Status: Abnormal   Collection Time: 07/12/16  4:20 PM  Result Value Ref Range   Sodium 142 135 - 145 mmol/L   Potassium 4.0 3.5 - 5.1 mmol/L   Chloride 111 101 - 111 mmol/L   CO2 25 22 -  32 mmol/L   Glucose, Bld 101 (H) 65 - 99 mg/dL   BUN 10 6 - 20 mg/dL   Creatinine, Ser 0.67 0.44 - 1.00 mg/dL   Calcium 9.2 8.9 - 10.3 mg/dL   GFR calc non Af Amer >60 >60 mL/min   GFR calc Af Amer >60 >60 mL/min    Comment: (NOTE) The eGFR has been calculated using the CKD EPI equation. This calculation has not been validated in all clinical situations. eGFR's persistently <60 mL/min signify possible Chronic Kidney Disease.    Anion gap 6 5 - 15  CBC     Status: None   Collection Time: 07/12/16  4:20 PM  Result Value Ref Range   WBC 7.9 3.6 - 11.0 K/uL   RBC 4.10 3.80 - 5.20 MIL/uL   Hemoglobin 13.0 12.0 - 16.0 g/dL   HCT 37.8 35.0 - 47.0 %   MCV 92.4 80.0 - 100.0 fL   MCH 31.8 26.0 - 34.0 pg   MCHC 34.4 32.0 - 36.0 g/dL   RDW 14.5 11.5 - 14.5 %   Platelets 203 150 - 440 K/uL  Troponin I     Status: None   Collection Time: 07/12/16  4:20 PM  Result Value Ref Range   Troponin I <0.03 <0.03 ng/mL  Urinalysis, Routine w reflex microscopic     Status: Abnormal   Collection Time: 07/13/16  8:19 AM  Result Value Ref Range   Color, Urine YELLOW (A) YELLOW   APPearance HAZY (A) CLEAR   Specific Gravity, Urine 1.014 1.005 - 1.030   pH 6.0 5.0 - 8.0   Glucose, UA NEGATIVE NEGATIVE mg/dL   Hgb urine dipstick NEGATIVE NEGATIVE   Bilirubin Urine NEGATIVE NEGATIVE   Ketones, ur 20 (A) NEGATIVE mg/dL   Protein, ur NEGATIVE NEGATIVE mg/dL   Nitrite NEGATIVE NEGATIVE   Leukocytes, UA LARGE (A) NEGATIVE   RBC / HPF 0-5 0 - 5 RBC/hpf   WBC, UA TOO NUMEROUS TO COUNT 0 - 5 WBC/hpf   Bacteria, UA RARE (A) NONE SEEN   Squamous Epithelial / LPF 0-5 (A) NONE SEEN  Urine Drug Screen, Qualitative ([facility] only)     Status: None   Collection Time: 07/13/16  8:19 AM  Result Value Ref Range   Tricyclic, Ur Screen NONE DETECTED NONE DETECTED   Amphetamines, Ur Screen NONE DETECTED NONE DETECTED   MDMA (Ecstasy)Ur Screen NONE DETECTED  NONE DETECTED   Cocaine Metabolite,Ur [place] NONE DETECTED NONE  DETECTED   Opiate, Ur Screen NONE DETECTED NONE DETECTED   Phencyclidine (PCP) Ur S NONE DETECTED NONE DETECTED   Cannabinoid 50 Ng, Ur [place] NONE DETECTED NONE DETECTED   Barbiturates, Ur Screen NONE DETECTED NONE DETECTED   Benzodiazepine, Ur Scrn NONE DETECTED NONE DETECTED   Methadone Scn, Ur NONE DETECTED NONE DETECTED    Comment: (NOTE) 169  Tricyclics, urine               Cutoff 1000 ng/mL 200  Amphetamines, urine             Cutoff 1000 ng/mL 300  MDMA (Ecstasy), urine           Cutoff 500 ng/mL 400  Cocaine Metabolite, urine       Cutoff 300 ng/mL 500  Opiate, urine                   Cutoff 300 ng/mL 600  Phencyclidine (PCP), urine      Cutoff 25 ng/mL 700  Cannabinoid, urine              Cutoff 50 ng/mL 800  Barbiturates, urine             Cutoff 200 ng/mL 900  Benzodiazepine, urine           Cutoff 200 ng/mL 1000 Methadone, urine                Cutoff 300 ng/mL 1100 1200 The urine drug screen provides only a preliminary, unconfirmed 1300 analytical test result and should not be used for non-medical 1400 purposes. Clinical consideration and professional judgment should 1500 be applied to any positive drug screen result due to possible 1600 interfering substances. A more specific alternate chemical method 1700 must be used in order to obtain a confirmed analytical result.  1800 Gas chromato graphy / mass spectrometry (GC/MS) is the preferred 1900 confirmatory method.   Urine Culture     Status: Abnormal   Collection Time: 07/13/16  8:19 AM  Result Value Ref Range   Specimen Description URINE, CLEAN CATCH    Special Requests Normal    Culture MULTIPLE SPECIES PRESENT, SUGGEST RECOLLECTION (A)    Report Status 07/15/2016 FINAL      PHQ2/9: Depression screen [facility] 2/9 06/14/2016 03/21/2016 12/29/2015 04/20/2015 03/23/2015  Decreased Interest '3 3 1 1 ' 0  Down, Depressed, Hopeless '3 3 3 3 ' 0  PHQ - 2 Score '6 6 4 4 ' 0  Altered sleeping '3 3 3 3 ' -  Tired, decreased energy - '3 3 3 ' -   Change in appetite '3 3 3 3 ' -  Feeling bad or failure about yourself  '3 2 3 3 ' -  Trouble concentrating '1 2 3 3 ' -  Moving slowly or fidgety/restless '3 3 3 ' 0 -  Suicidal thoughts 0 0 0 0 -  PHQ-9 Score '19 22 22 19 ' -  Difficult doing work/chores Very difficult Very difficult Very difficult - -     Fall Risk: Fall Risk  06/14/2016 03/21/2016 12/29/2015 09/29/2015 04/20/2015  Falls in the past year? Yes Yes No No No  Number falls in past yr: 1 2 or more - - -  Injury with Fall? Yes Yes - - -      Assessment & Plan   1. COPD, severe ([place])  Continue inhalers, advised her to try quitting smoking again   2. Dyslipidemia  Lipid panel shows low  HDL : to improve HDL patient  needs to eat tree nuts ( pecans/pistachios/almonds ) four times weekly, eat fish two times weekly  and exercise  at least 150 minutes per week  3. Senile purpura (HCC)  reassurance  4. Chronic recurrent major depressive disorder ([place])  Continue follow up with Dr. Sharla Kidney  5. CVA, old, hemiparesis ([place])  Stable, still slightly weaker on left side  6. Benign hypertension  Still slightly up, but she gets light headed and we will not adjust dose of medication   7. GAD (generalized anxiety disorder)  Continue metoprolol. Off BZD  8. Hypokalemia  Recheck labs -comp panel   9. Protein-calorie malnutrition, mild ([place])  Recheck labs, discussed Boost

## 2016-08-08 ENCOUNTER — Encounter: Payer: Self-pay | Admitting: Family Medicine

## 2016-08-12 ENCOUNTER — Telehealth: Payer: Self-pay

## 2016-08-12 NOTE — Telephone Encounter (Signed)
No meds will be called in unless she is seen in office. She was d/c from hospital and need follow the d/c directions

## 2016-08-12 NOTE — Telephone Encounter (Signed)
PT CALLED STATES THAT SHE NEEDS XANAN CALLED IN. PT SHAKIN AND NAUSEA NEEDS SOMETHING CALLED IN. PT WAS LAST SEEN ON  07-22-16 NEXT APPT 09-02-16

## 2016-08-12 NOTE — Telephone Encounter (Signed)
PT WAS CALLED AND TOLD THAT PER DR. FAHEEM SHE WOULD NOT REFILL OR GIVE XANA. PT WAS TOLD THAT SHE COULD GO TO ER OR CALL HER PCP. PT WAS MADE AN APPT FOR NEXT MONDAY.

## 2016-08-12 NOTE — Telephone Encounter (Signed)
pt husband called wanted to know if any medication was called in.

## 2016-08-19 ENCOUNTER — Encounter: Payer: Self-pay | Admitting: Psychiatry

## 2016-08-19 ENCOUNTER — Ambulatory Visit (INDEPENDENT_AMBULATORY_CARE_PROVIDER_SITE_OTHER): Payer: Medicare HMO | Admitting: Psychiatry

## 2016-08-19 VITALS — BP 128/72 | HR 99 | Ht 65.0 in | Wt 100.6 lb

## 2016-08-19 DIAGNOSIS — F331 Major depressive disorder, recurrent, moderate: Secondary | ICD-10-CM | POA: Diagnosis not present

## 2016-08-19 DIAGNOSIS — F411 Generalized anxiety disorder: Secondary | ICD-10-CM

## 2016-08-19 DIAGNOSIS — R69 Illness, unspecified: Secondary | ICD-10-CM | POA: Diagnosis not present

## 2016-08-19 MED ORDER — VENLAFAXINE HCL ER 37.5 MG PO CP24: 37.5000 mg | ORAL_CAPSULE | Freq: Every day | ORAL | 1 refills | Status: DC

## 2016-08-19 MED ORDER — MIRTAZAPINE 15 MG PO TABS: 15.0000 mg | ORAL_TABLET | Freq: Every day | ORAL | 1 refills | Status: DC

## 2016-08-19 MED ORDER — BUSPIRONE HCL 5 MG PO TABS: 5.0000 mg | ORAL_TABLET | Freq: Two times a day (BID) | ORAL | 1 refills | Status: DC

## 2016-08-19 NOTE — Progress Notes (Signed)
Patient ID: Beth [name], female   DOB: Jun 27, 1945, 71 y.o.   MRN: [id number] [facility] MD/PA/NP OP Progress Note  08/19/2016 2:39 PM LICIA HARL  MRN:  [id number]   Subjective: Patient is a 71 year old married female with history of major depression and generalized anxiety disorder who was recently discharged from the [facility] presented for follow-up appointment. She reported that she has been having severe anxiety. She reported that she continues to feel anxious and feeling tremulous. She reported that she does not have any improvement in her symptoms. She brought her medications with her. She reported that she wants to have her anxiety under control and was asking for Xanax. I reviewed her medications again. She reported that she has been compliant with her medications.She currently denied having any suicidal ideations or plans.  Patient reported that her husband is currently in the car with her dog. She stated that she is trying to get better. She denied having any thoughts to hurt herself.            Chief Complaint:  Chief Complaint    Follow-up     Visit Diagnosis:   No diagnosis found.  Past Medical History:  Past Medical History:  Diagnosis Date  . Allergy   . Anxiety   . Asthma   . COPD (chronic obstructive pulmonary disease) ([place])   . CVA (cerebral infarction)   . Depression   . Fibromyalgia   . Headache   . Hyperlipidemia   . Hypertension   . IBS (irritable bowel syndrome)   . Stroke ([place])   . Vitamin D deficiency     Past Surgical History:  Procedure Laterality Date  . ABDOMINAL HYSTERECTOMY    . APPENDECTOMY    . BREAST SURGERY  2011   biopsy  . CERVICAL DISCECTOMY    . CHOLECYSTECTOMY    . SINUSOTOMY     Family History:  Family History  Problem Relation Age of Onset  . Anxiety disorder Mother   . Depression Mother   . Cancer Father   . Gallbladder disease Father   . Alcohol abuse Father   . Depression Father    Social History:   Social History   Social History  . Marital status: Married    Spouse name: N/A  . Number of children: N/A  . Years of education: N/A   Social History Main Topics  . Smoking status: Current Some Day Smoker    Packs/day: 1.00    Years: 35.00    Types: Cigarettes    Last attempt to quit: 01/29/2012  . Smokeless tobacco: Never Used     Comment: Restarted at 1/2 a pack a day.   . Alcohol use No  . Drug use: No  . Sexual activity: No   Other Topics Concern  . None   Social History Narrative  . None   Additional History:   Assessment:   Musculoskeletal: Strength & Muscle Tone: within normal limits Gait & Station: Slow Patient leans: N/A  Psychiatric Specialty Exam: Medication Refill   Anxiety  Symptoms include nervous/anxious behavior. Patient reports no suicidal ideas. Insomnia: Stated the mirtazapine did help her with insomnia.      Review of Systems  Psychiatric/Behavioral: Negative for hallucinations, memory loss and suicidal ideas. Depression: he continues to have depression but does state the medications do help. The patient is nervous/anxious. Insomnia: Stated the mirtazapine did help her with insomnia.     Blood pressure 128/72, pulse 99, height 5\' 5"  (  1.651 m), weight 100 lb 9.6 oz (45.6 kg).Body mass index is 16.74 kg/m.  General Appearance: Well Groomed  Eye Contact:  Good  Speech:  slow  Volume:  Decreased  Mood:  Anxious  Affect:  Congruent  Thought Process:  Linear  Orientation:  Full (Time, Place, and Person)  Thought Content:  Negative  Suicidal Thoughts:  No  Homicidal Thoughts:  No  Memory:  Immediate;   Good Recent;   Good Remote;   Good  Judgement:  Good  Insight:  Good  Psychomotor Activity:  Psychomotor Retardation  Concentration:  Good  Recall:  Good  Fund of Knowledge: Good  Language: Good  Akathisia:  Negative  Handed:  Right  AIMS (if indicated):  N/A  Assets:  Social Support  ADL's:  Intact  Cognition: WNL  Sleep: fair,  improved with remeron   Is the patient at risk to self?  No. Has the patient been a risk to self in the past 6 months?  No. Has the patient been a risk to self within the distant past?  No. Is the patient a risk to others?  No. Has the patient been a risk to others in the past 6 months?  No. Has the patient been a risk to others within the distant past?  No.  Current Medications: Current Outpatient Prescriptions  Medication Sig Dispense Refill  . atorvastatin (LIPITOR) 40 MG tablet TAKE 1 TABLET(40 MG) BY MOUTH DAILY 90 tablet 1  . azelastine (ASTELIN) 0.1 % nasal spray Place 1 spray into both nostrils 2 (two) times daily. Use in each nostril as directed 30 mL 2  . fluticasone (FLONASE) 50 MCG/ACT nasal spray Place 2 sprays into both nostrils daily. 16 g 2  . fluticasone furoate-vilanterol (BREO ELLIPTA) 100-25 MCG/INH AEPB Inhale 1 puff into the lungs daily. 120 each 1  . Ipratropium-Albuterol (COMBIVENT RESPIMAT) 20-100 MCG/ACT AERS respimat Inhale 1 puff into the lungs every 6 (six) hours. 1 Inhaler 0  . metoprolol succinate (TOPROL-XL) 25 MG 24 hr tablet Take 1 tablet (25 mg total) by mouth daily. 90 tablet 1  . mirtazapine (REMERON) 45 MG tablet Take by mouth.    Marland Kitchen omeprazole (PRILOSEC) 40 MG capsule Take 1 capsule (40 mg total) by mouth daily. 30 capsule 1  . venlafaxine XR (EFFEXOR-XR) 37.5 MG 24 hr capsule Take by mouth.     No current facility-administered medications for this visit.     Medical Decision Making:  Established Problem, Stable/Improving (1)  Treatment Plan Summary:Medication management and Plan   Continue Remeron 15 mg at bedtime Continue Effexor XR 37.5 mg in the morning Start her on BuSpar 5 mg by mouth twice a day     Follow-up in 2 weeks      More than 50% of the time spent in psychoeducation, counseling and coordination of care.    This note was generated in part or whole with voice recognition software. Voice regonition is usually quite accurate  but there are transcription errors that can and very often do occur. I apologize for any typographical errors that were not detected and corrected.   Rainey Pines, MD  08/19/2016, 2:39 PM

## 2016-08-20 ENCOUNTER — Telehealth: Payer: Self-pay | Admitting: Family Medicine

## 2016-08-20 ENCOUNTER — Other Ambulatory Visit: Payer: Self-pay | Admitting: Family Medicine

## 2016-08-20 DIAGNOSIS — J449 Chronic obstructive pulmonary disease, unspecified: Secondary | ICD-10-CM

## 2016-08-20 NOTE — Telephone Encounter (Signed)
Spoke with Judeen Hammans and verified that medications had either been sent over or had current refills

## 2016-08-20 NOTE — Telephone Encounter (Signed)
Pt needs a refill on Breo to be called into [facility].

## 2016-08-20 NOTE — Telephone Encounter (Signed)
Allyn Kenner from [facility] requesting return call. Pt told her that her scripts where not at the pharmacy.  [phone]

## 2016-08-21 ENCOUNTER — Other Ambulatory Visit: Payer: Self-pay

## 2016-08-21 DIAGNOSIS — J449 Chronic obstructive pulmonary disease, unspecified: Secondary | ICD-10-CM

## 2016-08-21 MED ORDER — FLUTICASONE FUROATE-VILANTEROL 100-25 MCG/INH IN AEPB: 1.0000 | INHALATION_SPRAY | Freq: Every day | RESPIRATORY_TRACT | 1 refills | Status: DC

## 2016-08-21 NOTE — Telephone Encounter (Signed)
errenous °

## 2016-09-02 ENCOUNTER — Encounter: Payer: Self-pay | Admitting: Psychiatry

## 2016-09-02 ENCOUNTER — Ambulatory Visit (INDEPENDENT_AMBULATORY_CARE_PROVIDER_SITE_OTHER): Payer: Medicare HMO | Admitting: Psychiatry

## 2016-09-02 VITALS — BP 129/76 | HR 96 | Temp 98.9°F | Wt 110.8 lb

## 2016-09-02 DIAGNOSIS — F411 Generalized anxiety disorder: Secondary | ICD-10-CM

## 2016-09-02 DIAGNOSIS — F331 Major depressive disorder, recurrent, moderate: Secondary | ICD-10-CM | POA: Diagnosis not present

## 2016-09-02 DIAGNOSIS — F1394 Sedative, hypnotic or anxiolytic use, unspecified with sedative, hypnotic or anxiolytic-induced mood disorder: Secondary | ICD-10-CM

## 2016-09-02 DIAGNOSIS — R69 Illness, unspecified: Secondary | ICD-10-CM | POA: Diagnosis not present

## 2016-09-02 MED ORDER — OLANZAPINE 2.5 MG PO TABS: 2.5000 mg | ORAL_TABLET | Freq: Every day | ORAL | 1 refills | Status: DC

## 2016-09-02 NOTE — Progress Notes (Signed)
Patient ID: Beth [name], female   DOB: [birth year], 71 y.o.   MRN: [id number] [facility] MD/PA/NP OP Progress Note  09/02/2016 1:02 PM Beth [name]  MRN:  [id number]   Subjective: Patient is a 71 year old married female who presented for follow-up appointment accompanied by her husband. She was shaking as usual and her husband reported that she is not able to sleep. Patient reported that she wakes up she takes and her mouth is dry in the morning. Her husband reported that she cannot eat. Patient reported that she is trying to eat at night. Her husband was asking for a prescription for Klonopin as he reported that he is getting Klonopin from his primary psychiatrist at this time. I asked him if  he is sharing his Klonopin with the patient and he reported that he gives her Klonopin sometimes. I discussed with him that I'm going to share this information with his psychiatrist and he became agitated and he picked up a pen  which was placed next to him on the table and stood up and was threatening to me. He was loud and demanding and asking me that I should not share this information with his primary psychiatrist.  I tried  to distract him and started talking to the patient about her medications and starting her on Zyprexa. He remained agitated and was demanding me that I should agree with him that I will not discuss about his Klonopin with his primary psychiatrist as he will cut down on his Klonopin. He was loud and was yelling at me. I finally agreed with him that I will not share this information with his primary psychiatrist.  Patient reported that she will try Zyprexa at bedtime to help her with sleep.    Chief Complaint:  Chief Complaint    Follow-up; Medication Refill     Visit Diagnosis:     ICD-10-CM   1. MDD (major depressive disorder), recurrent episode, moderate (HCC) F33.1   2. GAD (generalized anxiety disorder) F41.1   3. Sedative, hypnotic or anxiolytic-induced mood disorder ([place]) F13.94      Past Medical History:  Past Medical History:  Diagnosis Date  . Allergy   . Anxiety   . Asthma   . COPD (chronic obstructive pulmonary disease) ([place])   . CVA (cerebral infarction)   . Depression   . Fibromyalgia   . Headache   . Hyperlipidemia   . Hypertension   . IBS (irritable bowel syndrome)   . Stroke ([place])   . Vitamin D deficiency     Past Surgical History:  Procedure Laterality Date  . ABDOMINAL HYSTERECTOMY    . APPENDECTOMY    . BREAST SURGERY  2011   biopsy  . CERVICAL DISCECTOMY    . CHOLECYSTECTOMY    . SINUSOTOMY     Family History:  Family History  Problem Relation Age of Onset  . Anxiety disorder Mother   . Depression Mother   . Cancer Father   . Gallbladder disease Father   . Alcohol abuse Father   . Depression Father    Social History:  Social History   Social History  . Marital status: Married    Spouse name: N/A  . Number of children: N/A  . Years of education: N/A   Social History Main Topics  . Smoking status: Current Some Day Smoker    Packs/day: 1.00    Years: 35.00    Types: Cigarettes    Last attempt to quit: 01/29/2012  .  Smokeless tobacco: Never Used     Comment: Restarted at 1/2 a pack a day.   . Alcohol use No  . Drug use: No  . Sexual activity: No   Other Topics Concern  . None   Social History Narrative  . None   Additional History:   Assessment:   Musculoskeletal: Strength & Muscle Tone: within normal limits Gait & Station: Slow Patient leans: N/A  Psychiatric Specialty Exam: Medication Refill   Anxiety  Symptoms include nervous/anxious behavior. Patient reports no suicidal ideas. Insomnia: Stated the mirtazapine did help her with insomnia.      Review of Systems  Psychiatric/Behavioral: Negative for hallucinations, memory loss and suicidal ideas. Depression: he continues to have depression but does state the medications do help. The patient is nervous/anxious. Insomnia: Stated the mirtazapine did  help her with insomnia.     Blood pressure 129/76, pulse 96, temperature 98.9 F (37.2 C), temperature source Oral, weight 110 lb 12.8 oz (50.3 kg).Body mass index is 18.44 kg/m.  General Appearance: Casual  Eye Contact:  Good  Speech:  slow  Volume:  Decreased  Mood:  Anxious  Affect:  Congruent  Thought Process:  Linear  Orientation:  Full (Time, Place, and Person)  Thought Content:  Negative  Suicidal Thoughts:  No  Homicidal Thoughts:  No  Memory:  Immediate;   Good Recent;   Good Remote;   Good  Judgement:  Fair  Insight:  Fair  Psychomotor Activity:  Psychomotor Retardation  Concentration:  Good  Recall:  Good  Fund of Knowledge: Good  Language: Good  Akathisia:  Negative  Handed:  Right  AIMS (if indicated):  N/A  Assets:  Social Support  ADL's:  Intact  Cognition: WNL  Sleep: fair, improved with remeron   Is the patient at risk to self?  No. Has the patient been a risk to self in the past 6 months?  No. Has the patient been a risk to self within the distant past?  No. Is the patient a risk to others?  No. Has the patient been a risk to others in the past 6 months?  No. Has the patient been a risk to others within the distant past?  No.  Current Medications: Current Outpatient Prescriptions  Medication Sig Dispense Refill  . atorvastatin (LIPITOR) 40 MG tablet TAKE 1 TABLET(40 MG) BY MOUTH DAILY 90 tablet 1  . azelastine (ASTELIN) 0.1 % nasal spray Place 1 spray into both nostrils 2 (two) times daily. Use in each nostril as directed 30 mL 2  . busPIRone (BUSPAR) 5 MG tablet Take 1 tablet (5 mg total) by mouth 2 (two) times daily. 60 tablet 1  . fluticasone (FLONASE) 50 MCG/ACT nasal spray Place 2 sprays into both nostrils daily. 16 g 2  . fluticasone furoate-vilanterol (BREO ELLIPTA) 100-25 MCG/INH AEPB Inhale 1 puff into the lungs daily. 120 each 1  . Ipratropium-Albuterol (COMBIVENT RESPIMAT) 20-100 MCG/ACT AERS respimat Inhale 1 puff into the lungs every 6  (six) hours. 1 Inhaler 0  . metoprolol succinate (TOPROL-XL) 25 MG 24 hr tablet Take 1 tablet (25 mg total) by mouth daily. 90 tablet 1  . mirtazapine (REMERON) 15 MG tablet Take 1 tablet (15 mg total) by mouth at bedtime. 30 tablet 1  . omeprazole (PRILOSEC) 40 MG capsule Take 1 capsule (40 mg total) by mouth daily. 30 capsule 1  . OLANZapine (ZYPREXA) 2.5 MG tablet Take 1 tablet (2.5 mg total) by mouth at bedtime. 30 tablet 1  No current facility-administered medications for this visit.     Medical Decision Making:  Established Problem, Stable/Improving (1)  Treatment Plan Summary:Medication management and Plan   Continue Remeron 15 mg at bedtime D/c Effexor I will start her on Zyprexa 2.5 mg by mouth daily at bedtime. Start her on BuSpar 5 mg by mouth twice a day  No follow-up appointments will be made at this time.   More than 50% of the time spent in psychoeducation, counseling and coordination of care.    This note was generated in part or whole with voice recognition software. Voice regonition is usually quite accurate but there are transcription errors that can and very often do occur. I apologize for any typographical errors that were not detected and corrected.   Rainey Pines, MD  09/02/2016, 1:02 PM

## 2016-09-16 ENCOUNTER — Ambulatory Visit (INDEPENDENT_AMBULATORY_CARE_PROVIDER_SITE_OTHER): Payer: Medicare HMO | Admitting: Family Medicine

## 2016-09-16 ENCOUNTER — Encounter: Payer: Self-pay | Admitting: Family Medicine

## 2016-09-16 ENCOUNTER — Ambulatory Visit: Payer: Medicare HMO

## 2016-09-16 VITALS — BP 134/78 | HR 96 | Temp 98.3°F | Resp 16 | Ht 65.0 in | Wt 112.2 lb

## 2016-09-16 DIAGNOSIS — F332 Major depressive disorder, recurrent severe without psychotic features: Secondary | ICD-10-CM

## 2016-09-16 DIAGNOSIS — I10 Essential (primary) hypertension: Secondary | ICD-10-CM

## 2016-09-16 DIAGNOSIS — E8881 Metabolic syndrome: Secondary | ICD-10-CM

## 2016-09-16 DIAGNOSIS — E785 Hyperlipidemia, unspecified: Secondary | ICD-10-CM | POA: Diagnosis not present

## 2016-09-16 DIAGNOSIS — J449 Chronic obstructive pulmonary disease, unspecified: Secondary | ICD-10-CM | POA: Diagnosis not present

## 2016-09-16 DIAGNOSIS — L989 Disorder of the skin and subcutaneous tissue, unspecified: Secondary | ICD-10-CM | POA: Diagnosis not present

## 2016-09-16 DIAGNOSIS — R69 Illness, unspecified: Secondary | ICD-10-CM | POA: Diagnosis not present

## 2016-09-16 DIAGNOSIS — I69359 Hemiplegia and hemiparesis following cerebral infarction affecting unspecified side: Secondary | ICD-10-CM | POA: Diagnosis not present

## 2016-09-16 DIAGNOSIS — D692 Other nonthrombocytopenic purpura: Secondary | ICD-10-CM

## 2016-09-16 DIAGNOSIS — K130 Diseases of lips: Secondary | ICD-10-CM | POA: Diagnosis not present

## 2016-09-16 DIAGNOSIS — F1320 Sedative, hypnotic or anxiolytic dependence, uncomplicated: Secondary | ICD-10-CM | POA: Diagnosis not present

## 2016-09-16 MED ORDER — FLUTICASONE FUROATE-VILANTEROL 100-25 MCG/INH IN AEPB: 1.0000 | INHALATION_SPRAY | Freq: Every day | RESPIRATORY_TRACT | 5 refills | Status: DC

## 2016-09-16 NOTE — Progress Notes (Signed)
Name: Beth [name]   MRN: [id number]    DOB: 1946-01-19   Date:09/16/2016       Progress Note  Subjective  Chief Complaint  Chief Complaint  Patient presents with  . Anxiety    3 month follow up  . Depression    has gotten slightly worse  . Eczema    HPI  Major Depression: last admission  07/13/2016  admitted for suicide prevention at [facility].  She was seeing  psychiatrist , Dr. Sharla Kidney since last discharge,however it seems like Dr. Maricela Curet has dismissed her from her practice, secondary to her husband sharing clonazepam with Stanton Kidney and getting violent when Dr. Maricela Curet told him that she would have to report that to his psychiatrist.   COPD severe: she has been out of Breo, and is off Spiriva, but takes Combivent prn. She had quit smoking in 2014 but has been smoking again daily, half pack day, she states she resumed smoking because of depression. She has a chronic cough, most days, mild decrease in exercise tolerance, and occasional wheezing.   HTN: taking metoprolol for bp and migraine prevention. She still has intermittent dizziness and tremors.   History of CVA with left side hemiparesis: doing much better, very mild symptoms now. Taking aspirin and statin therapy, she is still slightly weak on left leg.   Metabolic Syndrome: she does not have DM, only metabolic syndrome. Last hgbA1C was 5.7%, down to 5.0 % during recent hospital stay. She denies polyphagia, polydipsia or polyuria.  Dyslipidemia: low HDL, reviewed labs done at [facility] health to improve advised her to eat tree nuts ( pecans/pistachios/almonds ) four times weekly, eat fish two times weekly  and exercise  at least 150 minutes per week  Skin: she is concerned about chapped lips, also bumps in her scalp and purple spots on her arms. Explained she has senile purpura, needs to stop picking on her head and use only vaseline chapstick   Patient Active Problem List   Diagnosis Date Noted  . Protein-calorie  malnutrition, mild ([place]) 08/07/2016  . Generalized anxiety disorder 07/15/2016  . Chronic neck and back pain 07/15/2016  . Severe episode of recurrent major depressive disorder, without psychotic features ([place]) 07/14/2016  . Moderate benzodiazepine use disorder ([place]) 05/08/2016  . Major depressive disorder, recurrent, severe w/o psychotic behavior ([place]) 05/01/2016  . Seasonal allergic rhinitis 03/23/2015  . Hyperglycemia 03/23/2015  . Senile purpura ([place]) 03/23/2015  . Perennial allergic rhinitis with seasonal variation 03/23/2015  . Marital problems 10/31/2014  . Migraine without aura and without status migrainosus, not intractable 10/31/2014  . Chronic LBP 08/09/2014  . Colon polyp 08/09/2014  . COPD, severe ([place]) 08/09/2014  . CVA, old, hemiparesis ([place]) 08/09/2014  . Dyslipidemia 08/09/2014  . Dysfunction of eustachian tube 08/09/2014  . Fibromyalgia syndrome 08/09/2014  . Gastro-esophageal reflux disease without esophagitis 08/09/2014  . Benign migrating glossitis 08/09/2014  . Cerebrovascular accident, old 08/09/2014  . IBS (irritable bowel syndrome) 08/09/2014  . Low back pain with radiation 08/09/2014  . Chronic recurrent major depressive disorder ([place]) 08/09/2014  . Dysmetabolic syndrome 78/24/2353  . OP (osteoporosis) 08/09/2014  . Vitamin D deficiency 08/09/2014  . Benign hypertension 07/19/2013  . Benign neoplasm of skin of trunk 06/03/2013  . H/O: pneumonia 09/25/2012    Past Surgical History:  Procedure Laterality Date  . ABDOMINAL HYSTERECTOMY    . APPENDECTOMY    . BREAST SURGERY  2011   biopsy  . CERVICAL DISCECTOMY    . CHOLECYSTECTOMY    .  SINUSOTOMY      Family History  Problem Relation Age of Onset  . Anxiety disorder Mother   . Depression Mother   . Cancer Father   . Gallbladder disease Father   . Alcohol abuse Father   . Depression Father     Social History   Social History  . Marital status: Married    Spouse name: N/A  . Number of  children: N/A  . Years of education: N/A   Occupational History  . Not on file.   Social History Main Topics  . Smoking status: Current Some Day Smoker    Packs/day: 1.00    Years: 35.00    Types: Cigarettes    Last attempt to quit: 01/29/2012  . Smokeless tobacco: Never Used     Comment: Restarted at 1/2 a pack a day.   . Alcohol use No  . Drug use: No  . Sexual activity: No   Other Topics Concern  . Not on file   Social History Narrative  . No narrative on file     Current Outpatient Prescriptions:  .  atorvastatin (LIPITOR) 40 MG tablet, TAKE 1 TABLET(40 MG) BY MOUTH DAILY, Disp: 90 tablet, Rfl: 1 .  azelastine (ASTELIN) 0.1 % nasal spray, Place 1 spray into both nostrils 2 (two) times daily. Use in each nostril as directed, Disp: 30 mL, Rfl: 2 .  busPIRone (BUSPAR) 5 MG tablet, Take 1 tablet (5 mg total) by mouth 2 (two) times daily., Disp: 60 tablet, Rfl: 1 .  fluticasone (FLONASE) 50 MCG/ACT nasal spray, Place 2 sprays into both nostrils daily., Disp: 16 g, Rfl: 2 .  fluticasone furoate-vilanterol (BREO ELLIPTA) 100-25 MCG/INH AEPB, Inhale 1 puff into the lungs daily., Disp: 60 each, Rfl: 5 .  gabapentin (NEURONTIN) 300 MG capsule, Take by mouth., Disp: , Rfl:  .  Ipratropium-Albuterol (COMBIVENT RESPIMAT) 20-100 MCG/ACT AERS respimat, Inhale 1 puff into the lungs every 6 (six) hours., Disp: 1 Inhaler, Rfl: 0 .  metoprolol succinate (TOPROL-XL) 25 MG 24 hr tablet, Take 1 tablet (25 mg total) by mouth daily., Disp: 90 tablet, Rfl: 1 .  mirtazapine (REMERON) 15 MG tablet, Take 1 tablet (15 mg total) by mouth at bedtime., Disp: 30 tablet, Rfl: 1 .  Multiple Vitamin (THERA) TABS, Take by mouth., Disp: , Rfl:  .  OLANZapine (ZYPREXA) 2.5 MG tablet, Take 1 tablet (2.5 mg total) by mouth at bedtime., Disp: 30 tablet, Rfl: 1 .  venlafaxine XR (EFFEXOR-XR) 37.5 MG 24 hr capsule, Take by mouth., Disp: , Rfl:   Allergies  Allergen Reactions  . Bextra  [Valdecoxib]   . Compazine   [Prochlorperazine Edisylate]   . Lithium Carbonate   . Lyrica [Pregabalin]      ROS  Constitutional: Negative for fever or weight change.  Respiratory: Positive  for cough and shortness of breath.   Cardiovascular: Negative for chest pain or palpitations.  Gastrointestinal: Negative for abdominal pain, no bowel changes.  Musculoskeletal: Negative for gait problem or joint swelling.  Skin: Positive  for rash, scalp.  Neurological: Negative for dizziness or headache.  No other specific complaints in a complete review of systems (except as listed in HPI above).  Objective  Vitals:   09/16/16 1343  BP: 134/78  Pulse: 96  Resp: 16  Temp: 98.3 F (36.8 C)  SpO2: 97%  Weight: 112 lb 3 oz (50.9 kg)  Height: 5' 5" (1.651 m)    Body mass index is 18.67 kg/m.  Physical Exam  Constitutional: Patient appears well-developed. Thin. No distress.  HEENT: head atraumatic, normocephalic, pupils equal and reactive to light, neck supple, throat within normal limits Cardiovascular: Normal rate, regular rhythm and normal heart sounds. No murmur heard. No BLE edema. Pulmonary/Chest: Effort normal and breath sounds normal. No respiratory distress. Abdominal: Soft. There is no tenderness. Skin:ecchymosis on both upper extremities Psychiatric: She is still anxious, but more calm today. No tremors.  Neurological: no tremors today, slow gait and seems slightly unsteady  Skin: scabs on scalp, no signs of lice or dandruf  Recent Results (from the past 2160 hour(s))  Basic metabolic panel     Status: Abnormal   Collection Time: 07/12/16  4:20 PM  Result Value Ref Range   Sodium 142 135 - 145 mmol/L   Potassium 4.0 3.5 - 5.1 mmol/L   Chloride 111 101 - 111 mmol/L   CO2 25 22 - 32 mmol/L   Glucose, Bld 101 (H) 65 - 99 mg/dL   BUN 10 6 - 20 mg/dL   Creatinine, Ser 0.67 0.44 - 1.00 mg/dL   Calcium 9.2 8.9 - 10.3 mg/dL   GFR calc non Af Amer >60 >60 mL/min   GFR calc Af Amer >60 >60 mL/min     Comment: (NOTE) The eGFR has been calculated using the CKD EPI equation. This calculation has not been validated in all clinical situations. eGFR's persistently <60 mL/min signify possible Chronic Kidney Disease.    Anion gap 6 5 - 15  CBC     Status: None   Collection Time: 07/12/16  4:20 PM  Result Value Ref Range   WBC 7.9 3.6 - 11.0 K/uL   RBC 4.10 3.80 - 5.20 MIL/uL   Hemoglobin 13.0 12.0 - 16.0 g/dL   HCT 37.8 35.0 - 47.0 %   MCV 92.4 80.0 - 100.0 fL   MCH 31.8 26.0 - 34.0 pg   MCHC 34.4 32.0 - 36.0 g/dL   RDW 14.5 11.5 - 14.5 %   Platelets 203 150 - 440 K/uL  Troponin I     Status: None   Collection Time: 07/12/16  4:20 PM  Result Value Ref Range   Troponin I <0.03 <0.03 ng/mL  Urinalysis, Routine w reflex microscopic     Status: Abnormal   Collection Time: 07/13/16  8:19 AM  Result Value Ref Range   Color, Urine YELLOW (A) YELLOW   APPearance HAZY (A) CLEAR   Specific Gravity, Urine 1.014 1.005 - 1.030   pH 6.0 5.0 - 8.0   Glucose, UA NEGATIVE NEGATIVE mg/dL   Hgb urine dipstick NEGATIVE NEGATIVE   Bilirubin Urine NEGATIVE NEGATIVE   Ketones, ur 20 (A) NEGATIVE mg/dL   Protein, ur NEGATIVE NEGATIVE mg/dL   Nitrite NEGATIVE NEGATIVE   Leukocytes, UA LARGE (A) NEGATIVE   RBC / HPF 0-5 0 - 5 RBC/hpf   WBC, UA TOO NUMEROUS TO COUNT 0 - 5 WBC/hpf   Bacteria, UA RARE (A) NONE SEEN   Squamous Epithelial / LPF 0-5 (A) NONE SEEN  Urine Drug Screen, Qualitative ([facility] only)     Status: None   Collection Time: 07/13/16  8:19 AM  Result Value Ref Range   Tricyclic, Ur Screen NONE DETECTED NONE DETECTED   Amphetamines, Ur Screen NONE DETECTED NONE DETECTED   MDMA (Ecstasy)Ur Screen NONE DETECTED NONE DETECTED   Cocaine Metabolite,Ur [place] NONE DETECTED NONE DETECTED   Opiate, Ur Screen NONE DETECTED NONE DETECTED   Phencyclidine (PCP) Ur S NONE DETECTED NONE DETECTED  Cannabinoid 50 Ng, Ur [place] NONE DETECTED NONE DETECTED   Barbiturates, Ur Screen NONE DETECTED NONE DETECTED    Benzodiazepine, Ur Scrn NONE DETECTED NONE DETECTED   Methadone Scn, Ur NONE DETECTED NONE DETECTED    Comment: (NOTE) 638  Tricyclics, urine               Cutoff 1000 ng/mL 200  Amphetamines, urine             Cutoff 1000 ng/mL 300  MDMA (Ecstasy), urine           Cutoff 500 ng/mL 400  Cocaine Metabolite, urine       Cutoff 300 ng/mL 500  Opiate, urine                   Cutoff 300 ng/mL 600  Phencyclidine (PCP), urine      Cutoff 25 ng/mL 700  Cannabinoid, urine              Cutoff 50 ng/mL 800  Barbiturates, urine             Cutoff 200 ng/mL 900  Benzodiazepine, urine           Cutoff 200 ng/mL 1000 Methadone, urine                Cutoff 300 ng/mL 1100 1200 The urine drug screen provides only a preliminary, unconfirmed 1300 analytical test result and should not be used for non-medical 1400 purposes. Clinical consideration and professional judgment should 1500 be applied to any positive drug screen result due to possible 1600 interfering substances. A more specific alternate chemical method 1700 must be used in order to obtain a confirmed analytical result.  1800 Gas chromato graphy / mass spectrometry (GC/MS) is the preferred 1900 confirmatory method.   Urine Culture     Status: Abnormal   Collection Time: 07/13/16  8:19 AM  Result Value Ref Range   Specimen Description URINE, CLEAN CATCH    Special Requests Normal    Culture MULTIPLE SPECIES PRESENT, SUGGEST RECOLLECTION (A)    Report Status 07/15/2016 FINAL   Basic metabolic panel     Status: Abnormal   Collection Time: 07/15/16  5:31 AM  Result Value Ref Range   Glucose 86    BUN 8 4 - 21   Creatinine 0.7 0.5 - 1.1   Potassium 3.1 (A) 3.4 - 5.3   Sodium 146 137 - 147  Lipid panel     Status: Abnormal   Collection Time: 07/15/16  5:31 AM  Result Value Ref Range   Triglycerides 78 40 - 160   Cholesterol 86 0 - 200   HDL 33 (A) 35 - 70   LDL Cholesterol 37   Hepatic function panel     Status: None   Collection  Time: 07/15/16  5:31 AM  Result Value Ref Range   Alkaline Phosphatase 57 25 - 125   ALT 13 7 - 35   AST 18 13 - 35   Bilirubin, Total 0.3   Hemoglobin A1c     Status: None   Collection Time: 07/15/16  5:31 AM  Result Value Ref Range   Hemoglobin A1C 5.0   Basic metabolic panel     Status: Abnormal   Collection Time: 07/16/16  5:49 AM  Result Value Ref Range   Glucose 91    BUN 7 4 - 21   Creatinine 0.8 0.5 - 1.1   Potassium 3.3 (A) 3.4 - 5.3  Sodium 145 137 - 147  COMPLETE METABOLIC PANEL WITH GFR     Status: None   Collection Time: 08/07/16  3:55 PM  Result Value Ref Range   Sodium 142 135 - 146 mmol/L   Potassium 3.9 3.5 - 5.3 mmol/L   Chloride 104 98 - 110 mmol/L   CO2 29 20 - 31 mmol/L   Glucose, Bld 93 65 - 99 mg/dL   BUN 14 7 - 25 mg/dL   Creat 0.72 0.60 - 0.93 mg/dL    Comment:   For patients > or = 73 years of age: The upper reference limit for Creatinine is approximately 13% higher for people identified as African-American.      Total Bilirubin 0.5 0.2 - 1.2 mg/dL   Alkaline Phosphatase 79 33 - 130 U/L   AST 15 10 - 35 U/L   ALT 10 6 - 29 U/L   Total Protein 6.2 6.1 - 8.1 g/dL   Albumin 3.7 3.6 - 5.1 g/dL   Calcium 9.5 8.6 - 10.4 mg/dL   GFR, Est African American >89 >=60 mL/min   GFR, Est Non African American 84 >=60 mL/min      PHQ2/9: Depression screen [facility] 2/9 09/16/2016 06/14/2016 03/21/2016 12/29/2015 04/20/2015  Decreased Interest _0 Down, Depressed, Hopeless _1 PHQ - 2 Score _2 Altered sleeping _3 Tired, decreased energy 1 - _4 Change in appetite _5 Feeling bad or failure about yourself  0 _6 Trouble concentrating _7 Moving slowly or fidgety/restless 0 _8 0  Suicidal thoughts 0 0 0 0 0  PHQ-9 Score _9 Difficult doing work/chores Somewhat difficult Very difficult Very difficult Very difficult -     Fall Risk: Fall Risk  06/14/2016 03/21/2016 12/29/2015 09/29/2015 04/20/2015   Falls in the past year? Yes Yes No No No  Number falls in past yr: 1 2 or more - - -  Injury with Fall? Yes Yes - - -      Assessment & Plan  1. Severe episode of recurrent major depressive disorder, without psychotic features ([place])  She was under the care of Dr. Maricela Curet, but per patient she has been dismissed from her practice because she took one of her husband's clonazepam, explained that I will not take over her psychiatric care and that she needs to find another Psychiatrist.  -Referral psychiatrist  2. CVA, old, hemiparesis ([place])  stable  3. COPD, severe ([place])  She has been out of Breo, explained importance of compliance - fluticasone furoate-vilanterol (BREO ELLIPTA) 100-25 MCG/INH AEPB; Inhale 1 puff into the lungs daily.  Dispense: 60 each; Refill: 5  4. Benign hypertension  Taking half Metoprolol and bp is at goal    5. Dyslipidemia  Continue atorvastatin   6. Dysmetabolic syndrome  Last OIBB0W was normal.   7. Senile purpura (H[facility])  Stable, gave her reassurance  8. Moderate benzodiazepine use disorder ([facility])  She was finally weaned off, but too one of her husband's medications, she feels like that the only thing that helps with her tremors is BZD.  Currently not taking anything, but would like to go back on medication  9. Sore on scalp  She picks on her scalp, no signs of lice or seborrhea. Advised to trim nails short and  avoid scratching   10. Dry lips  Advised to only use Vaseline Chapstick, and nothing else, avoid liking her lips

## 2016-09-18 ENCOUNTER — Other Ambulatory Visit: Payer: Self-pay | Admitting: Family Medicine

## 2016-09-18 DIAGNOSIS — R112 Nausea with vomiting, unspecified: Secondary | ICD-10-CM

## 2016-10-01 ENCOUNTER — Other Ambulatory Visit: Payer: Self-pay | Admitting: Psychiatry

## 2016-10-03 ENCOUNTER — Telehealth: Payer: Self-pay | Admitting: Family Medicine

## 2016-10-03 DIAGNOSIS — J449 Chronic obstructive pulmonary disease, unspecified: Secondary | ICD-10-CM

## 2016-10-03 NOTE — Telephone Encounter (Signed)
PT SAID THAT HER POCKET BOOK GOT STOLEN AND HER BREO WAS IN THERE AND SHE NEEDS A REFILL . PH[facility] IS [facility]. ALSO WANTS TO KNOW IF YOU HAVE FOUND HER A PHYSIATRIC DR.

## 2016-10-04 MED ORDER — FLUTICASONE FUROATE-VILANTEROL 100-25 MCG/INH IN AEPB: 1.0000 | INHALATION_SPRAY | Freq: Every day | RESPIRATORY_TRACT | 5 refills | Status: DC

## 2016-10-04 NOTE — Telephone Encounter (Signed)
Give her a sample, and ask Kristeen Miss about referral

## 2016-10-21 ENCOUNTER — Other Ambulatory Visit: Payer: Self-pay | Admitting: Psychiatry

## 2016-10-21 ENCOUNTER — Other Ambulatory Visit: Payer: Self-pay

## 2016-10-21 MED ORDER — LIDOCAINE 5 % EX OINT: TOPICAL_OINTMENT | CUTANEOUS | 5 refills | Status: DC

## 2016-11-04 ENCOUNTER — Other Ambulatory Visit: Payer: Self-pay

## 2016-11-04 ENCOUNTER — Telehealth: Payer: Self-pay | Admitting: Family Medicine

## 2016-11-04 NOTE — Telephone Encounter (Signed)
Pt has been referred to Dr Zelphia Cairo and states that he is not able to get her in until December. Pt is asking that you please refer her someplace else that could get her in sooner. Also states that Dr Gretel Acre did not refill her medication [phone]

## 2016-11-04 NOTE — Telephone Encounter (Signed)
I am so sorry, please see if anyone else can see her, I do not feel comfortable prescribing her psychiatric medications. She may need to go to [facility] for admission if she goes in crisis mode

## 2016-11-04 NOTE — Telephone Encounter (Addendum)
Patient requesting refill of Gabapentin and Venlafaxine ER to [facility].

## 2016-11-05 NOTE — Telephone Encounter (Signed)
I tried to contact this patient to inform her that Dr. Ancil Boozer does not feel comfortable with prescribing the requested medication and that she needed to contact her insurance company to find out what office is in network, but there was no answer. A message was left for this patient to give [place] a call when she gets the chance so the above can be discussed.

## 2016-11-15 ENCOUNTER — Emergency Department
Admission: EM | Admit: 2016-11-15 | Discharge: 2016-11-15 | Disposition: A | Discharge status: Home / Self Care | Payer: Medicare HMO | Attending: Emergency Medicine | Admitting: Emergency Medicine

## 2016-11-15 ENCOUNTER — Emergency Department: Payer: Medicare HMO

## 2016-11-15 DIAGNOSIS — Z8673 Personal history of transient ischemic attack (TIA), and cerebral infarction without residual deficits: Secondary | ICD-10-CM | POA: Diagnosis not present

## 2016-11-15 DIAGNOSIS — G8929 Other chronic pain: Secondary | ICD-10-CM | POA: Insufficient documentation

## 2016-11-15 DIAGNOSIS — Z79899 Other long term (current) drug therapy: Secondary | ICD-10-CM | POA: Insufficient documentation

## 2016-11-15 DIAGNOSIS — J449 Chronic obstructive pulmonary disease, unspecified: Secondary | ICD-10-CM | POA: Diagnosis not present

## 2016-11-15 DIAGNOSIS — R69 Illness, unspecified: Secondary | ICD-10-CM | POA: Diagnosis not present

## 2016-11-15 DIAGNOSIS — R531 Weakness: Secondary | ICD-10-CM

## 2016-11-15 DIAGNOSIS — N390 Urinary tract infection, site not specified: Secondary | ICD-10-CM

## 2016-11-15 DIAGNOSIS — F17210 Nicotine dependence, cigarettes, uncomplicated: Secondary | ICD-10-CM | POA: Insufficient documentation

## 2016-11-15 DIAGNOSIS — R5383 Other fatigue: Secondary | ICD-10-CM | POA: Diagnosis not present

## 2016-11-15 DIAGNOSIS — M6281 Muscle weakness (generalized): Secondary | ICD-10-CM | POA: Diagnosis not present

## 2016-11-15 DIAGNOSIS — J45909 Unspecified asthma, uncomplicated: Secondary | ICD-10-CM | POA: Insufficient documentation

## 2016-11-15 LAB — URINALYSIS, COMPLETE (UACMP) WITH MICROSCOPIC
BILIRUBIN URINE: NEGATIVE
Glucose, UA: NEGATIVE mg/dL
HGB URINE DIPSTICK: NEGATIVE
KETONES UR: NEGATIVE mg/dL
Nitrite: NEGATIVE
Protein, ur: NEGATIVE mg/dL
Specific Gravity, Urine: 1.017 (ref 1.005–1.030)
pH: 5 (ref 5.0–8.0)

## 2016-11-15 LAB — BASIC METABOLIC PANEL
ANION GAP: 9 (ref 5–15)
BUN: 15 mg/dL (ref 6–20)
CHLORIDE: 106 mmol/L (ref 101–111)
CO2: 25 mmol/L (ref 22–32)
CREATININE: 1.04 mg/dL — AB (ref 0.44–1.00)
Calcium: 8.8 mg/dL — ABNORMAL LOW (ref 8.9–10.3)
GFR calc non Af Amer: 52 mL/min — ABNORMAL LOW (ref >60)
Glucose, Bld: 85 mg/dL (ref 65–99)
POTASSIUM: 3.6 mmol/L (ref 3.5–5.1)
SODIUM: 140 mmol/L (ref 135–145)

## 2016-11-15 LAB — HEPATIC FUNCTION PANEL
ALK PHOS: 74 U/L (ref 38–126)
ALT: 14 U/L (ref 14–54)
AST: 19 U/L (ref 15–41)
Albumin: 3.3 g/dL — ABNORMAL LOW (ref 3.5–5.0)
BILIRUBIN DIRECT: 0.1 mg/dL (ref 0.1–0.5)
BILIRUBIN INDIRECT: 0.5 mg/dL (ref 0.3–0.9)
TOTAL PROTEIN: 6.2 g/dL — AB (ref 6.5–8.1)
Total Bilirubin: 0.6 mg/dL (ref 0.3–1.2)

## 2016-11-15 LAB — CBC
HEMATOCRIT: 38.7 % (ref 35.0–47.0)
HEMOGLOBIN: 13.4 g/dL (ref 12.0–16.0)
MCH: 32.1 pg (ref 26.0–34.0)
MCHC: 34.5 g/dL (ref 32.0–36.0)
MCV: 93 fL (ref 80.0–100.0)
Platelets: 226 10*3/uL (ref 150–440)
RBC: 4.16 MIL/uL (ref 3.80–5.20)
RDW: 13.9 % (ref 11.5–14.5)
WBC: 8.1 10*3/uL (ref 3.6–11.0)

## 2016-11-15 LAB — TROPONIN I

## 2016-11-15 MED ORDER — CEPHALEXIN 500 MG PO CAPS: 500.0000 mg | ORAL_CAPSULE | Freq: Four times a day (QID) | ORAL | 0 refills | Status: DC

## 2016-11-15 MED ORDER — SODIUM CHLORIDE 0.9 % IV BOLUS (SEPSIS)
500.0000 mL | Freq: Once | INTRAVENOUS | Status: AC
  Administered 2016-11-15: 500 mL via INTRAVENOUS

## 2016-11-15 MED ORDER — CEFTRIAXONE SODIUM IN DEXTROSE 20 MG/ML IV SOLN
1.0000 g | Freq: Once | INTRAVENOUS | Status: AC
  Administered 2016-11-15: 1 g via INTRAVENOUS
  Filled 2016-11-15: qty 50

## 2016-11-15 MED ORDER — CEPHALEXIN 500 MG PO CAPS: 500.0000 mg | ORAL_CAPSULE | Freq: Three times a day (TID) | ORAL | 0 refills | Status: AC

## 2016-11-15 MED ORDER — LORAZEPAM 2 MG/ML IJ SOLN
1.0000 mg | Freq: Once | INTRAMUSCULAR | Status: AC
Start: 2016-11-15 — End: 2016-11-15
  Administered 2016-11-15: 1 mg via INTRAVENOUS
  Filled 2016-11-15: qty 1

## 2016-11-15 NOTE — ED Triage Notes (Signed)
Weakness, fatigue, decreased appetite and dizziness for past few days. Pt VSS with EMS, CBG with EMS. Pt alert and oriented X4, active, cooperative, pt in NAD. RR even and unlabored, color WNL.

## 2016-11-15 NOTE — ED Notes (Signed)
Lab states that they are able to add urine culture to specimen in lab.

## 2016-11-15 NOTE — ED Provider Notes (Addendum)
[facility]  ____________________________________________   I have reviewed the triage vital signs and the nursing notes.   HISTORY  Chief Complaint Weakness    HPI SOUA LENK is a 71 y.o. female With a history of COPD, CVA in the past, anxiety, headaches, viral myalgia, hypertension,irritable bowel syndrome, benzo use disorder, major depression states that she has absolutely no energy. She really can't give much more of a history in the neck or declines to do so. Review of systems otherwise negative. She states she has burning in her feet. She states she is not taking any of her medications. She's been taking her husband's benzodiazepines because her psychiatrist refuses to prescribe her those medications after they found her to be takingher husband's. Patient states she has no SI or HI. She has feels that she has decreased energy.she has not fallen she has not hit her head   Past Medical History:  Diagnosis Date  . Allergy   . Anxiety   . Asthma   . COPD (chronic obstructive pulmonary disease) ([place])   . CVA (cerebral infarction)   . Depression   . Fibromyalgia   . Headache   . Hyperlipidemia   . Hypertension   . IBS (irritable bowel syndrome)   . Stroke ([place])   . Vitamin D deficiency     Patient Active Problem List   Diagnosis Date Noted  . Protein-calorie malnutrition, mild ([place]) 08/07/2016  . Generalized anxiety disorder 07/15/2016  . Chronic neck and back pain 07/15/2016  . Severe episode of recurrent major depressive disorder, without psychotic features ([place]) 07/14/2016  . Moderate benzodiazepine use disorder ([place]) 05/08/2016  . Major depressive disorder, recurrent, severe w/o psychotic behavior ([place]) 05/01/2016  . Seasonal allergic rhinitis 03/23/2015  . Hyperglycemia 03/23/2015  . Senile purpura ([place]) 03/23/2015  . Perennial allergic rhinitis with seasonal variation 03/23/2015  . Marital  problems 10/31/2014  . Migraine without aura and without status migrainosus, not intractable 10/31/2014  . Chronic LBP 08/09/2014  . Colon polyp 08/09/2014  . COPD, severe ([place]) 08/09/2014  . CVA, old, hemiparesis ([place]) 08/09/2014  . Dyslipidemia 08/09/2014  . Dysfunction of eustachian tube 08/09/2014  . Fibromyalgia syndrome 08/09/2014  . Gastro-esophageal reflux disease without esophagitis 08/09/2014  . Benign migrating glossitis 08/09/2014  . Cerebrovascular accident, old 08/09/2014  . IBS (irritable bowel syndrome) 08/09/2014  . Low back pain with radiation 08/09/2014  . Chronic recurrent major depressive disorder ([place]) 08/09/2014  . Dysmetabolic syndrome 16/10/9602  . OP (osteoporosis) 08/09/2014  . Vitamin D deficiency 08/09/2014  . Benign hypertension 07/19/2013  . Benign neoplasm of skin of trunk 06/03/2013  . H/O: pneumonia 09/25/2012    Past Surgical History:  Procedure Laterality Date  . ABDOMINAL HYSTERECTOMY    . APPENDECTOMY    . BREAST SURGERY  2011   biopsy  . CERVICAL DISCECTOMY    . CHOLECYSTECTOMY    . SINUSOTOMY      Prior to Admission medications   Medication Sig Start Date End Date Taking? Authorizing Provider  atorvastatin (LIPITOR) 40 MG tablet TAKE 1 TABLET(40 MG) BY MOUTH DAILY 06/14/16   Ancil Boozer, Drue Stager, MD  azelastine (ASTELIN) 0.1 % nasal spray Place 1 spray into both nostrils 2 (two) times daily. Use in each nostril as directed 06/14/16   Steele Sizer, MD  busPIRone (BUSPAR) 5 MG tablet Take 1 tablet (5 mg total) by mouth 2 (two) times daily. 08/19/16   Rainey Pines, MD  fluticasone (FLONASE) 50  MCG/ACT nasal spray Place 2 sprays into both nostrils daily. 09/29/15   Sowles, Drue Stager, MD  fluticasone furoate-vilanterol (BREO ELLIPTA) 100-25 MCG/INH AEPB Inhale 1 puff into the lungs daily. 10/04/16   Steele Sizer, MD  gabapentin (NEURONTIN) 300 MG capsule Take by mouth. 07/18/16   [provider]  Ipratropium-Albuterol (COMBIVENT RESPIMAT)  20-100 MCG/ACT AERS respimat Inhale 1 puff into the lungs every 6 (six) hours. 06/14/16   Steele Sizer, MD  lidocaine (XYLOCAINE) 5 % ointment Apply 2-3 grams topically to affected area 3-4 times per day. 10/21/16 03/23/17  Steele Sizer, MD  metoprolol succinate (TOPROL-XL) 25 MG 24 hr tablet Take 1 tablet (25 mg total) by mouth daily. 06/14/16   Steele Sizer, MD  mirtazapine (REMERON) 15 MG tablet Take 1 tablet (15 mg total) by mouth at bedtime. 08/19/16   Rainey Pines, MD  Multiple Vitamin (THERA) TABS Take by mouth. 07/19/16   [provider]  OLANZapine (ZYPREXA) 2.5 MG tablet Take 1 tablet (2.5 mg total) by mouth at bedtime. 09/02/16   Rainey Pines, MD  omeprazole (PRILOSEC) 40 MG capsule TAKE 1 CAPSULE BY MOUTH ONCE DAILY 09/18/16   Steele Sizer, MD  venlafaxine XR (EFFEXOR-XR) 37.5 MG 24 hr capsule Take by mouth. 07/19/16   [provider]    Allergies Bextra  [valdecoxib]; Compazine  [prochlorperazine edisylate]; Lithium carbonate; and Lyrica [pregabalin]  Family History  Problem Relation Age of Onset  . Anxiety disorder Mother   . Depression Mother   . Cancer Father   . Gallbladder disease Father   . Alcohol abuse Father   . Depression Father     Social History Social History  Substance Use Topics  . Smoking status: Current Some Day Smoker    Packs/day: 1.00    Years: 35.00    Types: Cigarettes    Last attempt to quit: 01/29/2012  . Smokeless tobacco: Never Used     Comment: Restarted at 1/2 a pack a day.   . Alcohol use No    Review of Systems Constitutional: No fever/chills Eyes: No visual changes. ENT: No sore throat. No stiff neck no neck pain Cardiovascular: Denies chest pain. Respiratory: Denies shortness of breath. Gastrointestinal:   no vomiting.  No diarrhea.  No constipation. Genitourinary: Negative for dysuria. Musculoskeletal: Negative lower extremity swelling Skin: Negative for rash. Neurological: Negative for severe headaches,  focal weakness or numbness.   ____________________________________________   PHYSICAL EXAM:  VITAL SIGNS: ED Triage Vitals  Enc Vitals Group     BP 11/15/16 1607 (!) 130/56     Pulse Rate 11/15/16 1607 78     Resp 11/15/16 1607 18     Temp 11/15/16 1607 98.5 F (36.9 C)     Temp Source 11/15/16 1607 Oral     SpO2 11/15/16 1607 99 %     Weight 11/15/16 1609 110 lb (49.9 kg)     Height 11/15/16 1609 5\' 5"  (1.651 m)     Head Circumference --      Peak Flow --      Pain Score --      Pain Loc --      Pain Edu? --      Excl. in [place]? --     Constitutional: Alert and oriented. patient more languid thananything else, not lethargic will motivate and speak to me if I talked her directly Eyes: Conjunctivae are normal Head: Atraumatic HEENT: No congestion/rhinnorhea. Mucous membranes are moist.  Oropharynx non-erythematous Neck:   Nontender with no meningismus,  no masses, no stridor Cardiovascular: Normal rate, regular rhythm. Grossly normal heart sounds.  Good peripheral circulation. Respiratory: Normal respiratory effort.  No retractions. Lungs CTAB. Abdominal: Soft and nontender. No distention. No guarding no rebound Back:  There is no focal tenderness or step off.  there is no midline tenderness there are no lesions noted. there is no CVA tenderness Musculoskeletal: No lower extremity tenderness, no upper extremity tenderness. No joint effusions, no DVT signs strong distal pulses no edema Neurologic:  Normal speech and language. No gross focal neurologic deficits are appreciated.  Skin:  Skin is warm, dry and intact. No rash noted. Psychiatric: Mood and affect are Somewhat sedate. Speech and behavior are normal.  ____________________________________________   LABS (all labs ordered are listed, but only abnormal results are displayed)  Labs Reviewed  BASIC METABOLIC PANEL - Abnormal; Notable for the following:       Result Value   Creatinine, Ser 1.04 (*)    Calcium 8.8 (*)     GFR calc non Af Amer 52 (*)    All other components within normal limits  URINALYSIS, COMPLETE (UACMP) WITH MICROSCOPIC - Abnormal; Notable for the following:    Color, Urine YELLOW (*)    APPearance CLOUDY (*)    Leukocytes, UA LARGE (*)    Bacteria, UA RARE (*)    Squamous Epithelial / LPF 6-30 (*)    All other components within normal limits  HEPATIC FUNCTION PANEL - Abnormal; Notable for the following:    Total Protein 6.2 (*)    Albumin 3.3 (*)    All other components within normal limits  URINE CULTURE  CBC  TROPONIN I    Pertinent labs  results that were available during my care of the patient were reviewed by me and considered in my medical decision making (see chart for details). ____________________________________________  EKG  I personally interpreted any EKGs ordered by me or triage normal sinus rhythm rate 70 beats per an acute ST elevation or depression normal axis, no acute ischemic changes ____________________________________________  RADIOLOGY  Pertinent labs & imaging results that were available during my care of the patient were reviewed by me and considered in my medical decision making (see chart for details). If possible, patient and/or family made aware of any abnormal findings. ____________________________________________    PROCEDURES  Procedure(s) performed: None  Procedures  Critical Care performed: None  ____________________________________________   INITIAL IMPRESSION / ASSESSMENT AND PLAN / ED COURSE  Pertinent labs & imaging results that were available during my care of the patient were reviewed by me and considered in my medical decision making (see chart for details).  patient here with feelings of generalized weakness very nonspecific, she denies SI or HI, she does admit to taking nonprescription benzodiazepines. She is not taking any of her psych medications.patient has a feeling of generalized malaise and appears. Workup is very  reassuring, she does have a high white count she's not focal on exam, she does appear to be markedly dehydrated, liver fraction tests are reassuring, creatinine is reassuring, etc. Patient does appear to have urinary tract infection, urine culture will be sent, this only could be contributing to her generalized sense of torpor,given her age, and generalized weakness in complaining she may merit admission we'll talk to her further we will give her antibiotics for her perceived UTI and return to reassess disposition  ----------------------------------------- 7:03 PM on 11/15/2016 -----------------------------------------  patient requesting Ativan, she states that she liked him a prescription for an however  it is very clear that this is been denied her by her psychiatrist. She has no SI or HI she refuses to stay in the hospital she is awake and alert. She would like a Ativan as she feels a little shaky, there is no evidence of withdrawal but we will give her a dose here as a precaution, at that time she would like to be discharged. We have treated her urinary tract infection aggressively with Rocephin and we will send a urine culture. Return precautions and follow given and understood.    ____________________________________________   FINAL CLINICAL IMPRESSION(S) / ED DIAGNOSES  Final diagnoses:  Weakness      This chart was dictated using voice recognition software.  Despite best efforts to proofread,  errors can occur which can change meaning.      Schuyler Amor, MD 11/15/16 4158    Schuyler Amor, MD 11/15/16 [phone]

## 2016-11-17 LAB — URINE CULTURE: CULTURE: NO GROWTH

## 2016-11-27 ENCOUNTER — Ambulatory Visit (INDEPENDENT_AMBULATORY_CARE_PROVIDER_SITE_OTHER): Payer: Medicare HMO | Admitting: Family Medicine

## 2016-11-27 ENCOUNTER — Encounter: Payer: Self-pay | Admitting: Family Medicine

## 2016-11-27 VITALS — BP 130/80 | HR 70 | Temp 98.3°F | Resp 14 | Ht 65.0 in | Wt 112.9 lb

## 2016-11-27 DIAGNOSIS — M797 Fibromyalgia: Secondary | ICD-10-CM | POA: Diagnosis not present

## 2016-11-27 DIAGNOSIS — F1320 Sedative, hypnotic or anxiolytic dependence, uncomplicated: Secondary | ICD-10-CM

## 2016-11-27 DIAGNOSIS — R5383 Other fatigue: Secondary | ICD-10-CM

## 2016-11-27 DIAGNOSIS — E441 Mild protein-calorie malnutrition: Secondary | ICD-10-CM

## 2016-11-27 DIAGNOSIS — R69 Illness, unspecified: Secondary | ICD-10-CM | POA: Diagnosis not present

## 2016-11-27 MED ORDER — GABAPENTIN 300 MG PO CAPS: 300.0000 mg | ORAL_CAPSULE | Freq: Two times a day (BID) | ORAL | 0 refills | Status: DC

## 2016-11-27 NOTE — Progress Notes (Signed)
Name: Beth [name]   MRN: [id number]    DOB: 04/04/1945   Date:11/27/2016       Progress Note  Subjective  Chief Complaint  Chief Complaint  Patient presents with  . Chest Pain  . Urinary Tract Infection    follow up  . Abdominal Pain    HPI  EC follow up: she has been dismissed by psychiatrist and unable to find someone else to see her, it is causing more anxiety, out of benzodiazepines. She states not currently taking any of her husbands benzodiazepines, but felt better when she got some from [facility]. But it wore off within hours. She states tremors make her feel nervous inside and affects her ability to eat. She states chest pain resolved, no longer having shooting chest pain, she states her hips hurt, not abdomen. She was diagnosed with UTI and taking Keflex, and lower abdominal pain has improved. She would like refill of Gabapentin since it helps with anxiety and she used to take for FMS, she aches all over   Patient Active Problem List   Diagnosis Date Noted  . Protein-calorie malnutrition, mild ([place]) 08/07/2016  . Generalized anxiety disorder 07/15/2016  . Chronic neck and back pain 07/15/2016  . Severe episode of recurrent major depressive disorder, without psychotic features ([place]) 07/14/2016  . Moderate benzodiazepine use disorder ([place]) 05/08/2016  . Major depressive disorder, recurrent, severe w/o psychotic behavior ([place]) 05/01/2016  . Seasonal allergic rhinitis 03/23/2015  . Hyperglycemia 03/23/2015  . Senile purpura ([place]) 03/23/2015  . Perennial allergic rhinitis with seasonal variation 03/23/2015  . Marital problems 10/31/2014  . Migraine without aura and without status migrainosus, not intractable 10/31/2014  . Chronic LBP 08/09/2014  . Colon polyp 08/09/2014  . COPD, severe ([place]) 08/09/2014  . CVA, old, hemiparesis ([place]) 08/09/2014  . Dyslipidemia 08/09/2014  . Dysfunction of eustachian tube 08/09/2014  . Fibromyalgia syndrome 08/09/2014  . Gastro-esophageal  reflux disease without esophagitis 08/09/2014  . Benign migrating glossitis 08/09/2014  . Cerebrovascular accident, old 08/09/2014  . IBS (irritable bowel syndrome) 08/09/2014  . Low back pain with radiation 08/09/2014  . Chronic recurrent major depressive disorder ([place]) 08/09/2014  . Dysmetabolic syndrome 32/95/1884  . OP (osteoporosis) 08/09/2014  . Vitamin D deficiency 08/09/2014  . Benign hypertension 07/19/2013  . Benign neoplasm of skin of trunk 06/03/2013  . H/O: pneumonia 09/25/2012    Social History  Substance Use Topics  . Smoking status: Current Some Day Smoker    Packs/day: 1.00    Years: 35.00    Types: Cigarettes    Last attempt to quit: 01/29/2012  . Smokeless tobacco: Never Used     Comment: Restarted at 1/2 a pack a day.   . Alcohol use No     Current Outpatient Prescriptions:  .  atorvastatin (LIPITOR) 40 MG tablet, TAKE 1 TABLET(40 MG) BY MOUTH DAILY, Disp: 90 tablet, Rfl: 1 .  azelastine (ASTELIN) 0.1 % nasal spray, Place 1 spray into both nostrils 2 (two) times daily. Use in each nostril as directed, Disp: 30 mL, Rfl: 2 .  fluticasone (FLONASE) 50 MCG/ACT nasal spray, Place 2 sprays into both nostrils daily., Disp: 16 g, Rfl: 2 .  fluticasone furoate-vilanterol (BREO ELLIPTA) 100-25 MCG/INH AEPB, Inhale 1 puff into the lungs daily., Disp: 60 each, Rfl: 5 .  gabapentin (NEURONTIN) 300 MG capsule, Take 1 capsule (300 mg total) by mouth 2 (two) times daily., Disp: 60 capsule, Rfl: 0 .  Ipratropium-Albuterol (COMBIVENT RESPIMAT) 20-100 MCG/ACT AERS respimat, Inhale  1 puff into the lungs every 6 (six) hours., Disp: 1 Inhaler, Rfl: 0 .  lidocaine (XYLOCAINE) 5 % ointment, Apply 2-3 grams topically to affected area 3-4 times per day., Disp: 1063.2 g, Rfl: 5 .  metoprolol succinate (TOPROL-XL) 25 MG 24 hr tablet, Take 1 tablet (25 mg total) by mouth daily., Disp: 90 tablet, Rfl: 1 .  Multiple Vitamin (THERA) TABS, Take by mouth., Disp: , Rfl:  .  omeprazole (PRILOSEC)  40 MG capsule, TAKE 1 CAPSULE BY MOUTH ONCE DAILY, Disp: 30 capsule, Rfl: 1  Allergies  Allergen Reactions  . Bextra  [Valdecoxib]   . Compazine  [Prochlorperazine Edisylate]   . Lithium Carbonate   . Lyrica [Pregabalin]     ROS  Ten systems reviewed and is negative except as mentioned in HPI   Objective  Vitals:   11/27/16 1419  BP: 130/80  Pulse: 70  Resp: 14  Temp: 98.3 F (36.8 C)  TempSrc: Oral  SpO2: 98%  Weight: 112 lb 14.4 oz (51.2 kg)  Height: _0  (1.651 m)    Body mass index is 18.79 kg/m.   Physical Exam  Constitutional: Patient appears weak, has tremors only when she talks about it, otherwise closes eyes and is calm No distress.  HEENT: head atraumatic, normocephalic, pupils equal and reactive to light, e neck supple, throat within normal limits Cardiovascular: Normal rate, regular rhythm and normal heart sounds.  No murmur heard. No BLE edema. Pulmonary/Chest: Effort normal and breath sounds normal. No respiratory distress. Abdominal: Soft.  There is no tenderness. Psychiatric: Seems anxious when talking about lack of medication and inability to see psychiatrist, however when quiet seems calm, normal speech.   Recent Results (from the past 2160 hour(s))  Basic metabolic panel     Status: Abnormal   Collection Time: 11/15/16  4:32 PM  Result Value Ref Range   Sodium 140 135 - 145 mmol/L   Potassium 3.6 3.5 - 5.1 mmol/L   Chloride 106 101 - 111 mmol/L   CO2 25 22 - 32 mmol/L   Glucose, Bld 85 65 - 99 mg/dL   BUN 15 6 - 20 mg/dL   Creatinine, Ser 1.04 (H) 0.44 - 1.00 mg/dL   Calcium 8.8 (L) 8.9 - 10.3 mg/dL   GFR calc non Af Amer 52 (L) >60 mL/min   GFR calc Af Amer >60 >60 mL/min    Comment: (NOTE) The eGFR has been calculated using the CKD EPI equation. This calculation has not been validated in all clinical situations. eGFR's persistently <60 mL/min signify possible Chronic Kidney Disease.    Anion gap 9 5 - 15  CBC     Status: None    Collection Time: 11/15/16  4:32 PM  Result Value Ref Range   WBC 8.1 3.6 - 11.0 K/uL   RBC 4.16 3.80 - 5.20 MIL/uL   Hemoglobin 13.4 12.0 - 16.0 g/dL   HCT 38.7 35.0 - 47.0 %   MCV 93.0 80.0 - 100.0 fL   MCH 32.1 26.0 - 34.0 pg   MCHC 34.5 32.0 - 36.0 g/dL   RDW 13.9 11.5 - 14.5 %   Platelets 226 150 - 440 K/uL  Urinalysis, Complete w Microscopic     Status: Abnormal   Collection Time: 11/15/16  4:32 PM  Result Value Ref Range   Color, Urine YELLOW (A) YELLOW   APPearance CLOUDY (A) CLEAR   Specific Gravity, Urine 1.017 1.005 - 1.030   pH 5.0 5.0 - 8.0  Glucose, UA NEGATIVE NEGATIVE mg/dL   Hgb urine dipstick NEGATIVE NEGATIVE   Bilirubin Urine NEGATIVE NEGATIVE   Ketones, ur NEGATIVE NEGATIVE mg/dL   Protein, ur NEGATIVE NEGATIVE mg/dL   Nitrite NEGATIVE NEGATIVE   Leukocytes, UA LARGE (A) NEGATIVE   RBC / HPF 6-30 0 - 5 RBC/hpf   WBC, UA TOO NUMEROUS TO COUNT 0 - 5 WBC/hpf   Bacteria, UA RARE (A) NONE SEEN   Squamous Epithelial / LPF 6-30 (A) NONE SEEN   WBC Clumps PRESENT    Mucus PRESENT   Troponin I     Status: None   Collection Time: 11/15/16  4:32 PM  Result Value Ref Range   Troponin I <0.03 <0.03 ng/mL  Hepatic function panel     Status: Abnormal   Collection Time: 11/15/16  4:32 PM  Result Value Ref Range   Total Protein 6.2 (L) 6.5 - 8.1 g/dL   Albumin 3.3 (L) 3.5 - 5.0 g/dL   AST 19 15 - 41 U/L   ALT 14 14 - 54 U/L   Alkaline Phosphatase 74 38 - 126 U/L   Total Bilirubin 0.6 0.3 - 1.2 mg/dL   Bilirubin, Direct 0.1 0.1 - 0.5 mg/dL   Indirect Bilirubin 0.5 0.3 - 0.9 mg/dL  Urine culture     Status: None   Collection Time: 11/15/16  4:32 PM  Result Value Ref Range   Specimen Description URINE, RANDOM    Special Requests NONE    Culture      NO GROWTH Performed at [facility], [address], [place], [place] [address]    Report Status 11/17/2016 FINAL      Assessment & Plan  1. Other fatigue  Labs done at [facility] reviewed, she has  malnutrition, mild hypocalcemia and mild drop of kidney function, she sates she is so anxious that she cannot eat.   2. Moderate benzodiazepine use disorder  (HCC)  She was taking husbands BZD and dismissed by Dr. Maricela Curet, we have referred her to other providers but having difficulty with either transportation or being accepted by another psychiatrist. She does not seem to be taking any other medication for mood disorder, very anxious, but explained to her I don't feel comfortable managing her symptoms and she may need to go to [place] to get help.  She has been out of Zyprexa, Effexor , BZD and Buspar for over one month, must see psychiatrist  3. Mild protein-calorie malnutrition ([place])  Discussed importance of trying protein shakes  4. Fibromyalgia syndrome  - gabapentin (NEURONTIN) 300 MG capsule; Take 1 capsule (300 mg total) by mouth 2 (two) times daily.  Dispense: 60 capsule; Refill: 0

## 2016-12-05 ENCOUNTER — Other Ambulatory Visit: Payer: Self-pay

## 2016-12-05 ENCOUNTER — Emergency Department
Admission: EM | Admit: 2016-12-05 | Discharge: 2016-12-05 | Discharge status: Against Medical Advice | Payer: Medicare HMO | Attending: Emergency Medicine | Admitting: Emergency Medicine

## 2016-12-05 ENCOUNTER — Emergency Department: Payer: Medicare HMO

## 2016-12-05 ENCOUNTER — Encounter: Payer: Self-pay | Admitting: Emergency Medicine

## 2016-12-05 DIAGNOSIS — F17210 Nicotine dependence, cigarettes, uncomplicated: Secondary | ICD-10-CM | POA: Insufficient documentation

## 2016-12-05 DIAGNOSIS — J45909 Unspecified asthma, uncomplicated: Secondary | ICD-10-CM | POA: Diagnosis not present

## 2016-12-05 DIAGNOSIS — R531 Weakness: Secondary | ICD-10-CM | POA: Diagnosis not present

## 2016-12-05 DIAGNOSIS — I10 Essential (primary) hypertension: Secondary | ICD-10-CM | POA: Insufficient documentation

## 2016-12-05 DIAGNOSIS — F419 Anxiety disorder, unspecified: Secondary | ICD-10-CM | POA: Diagnosis present

## 2016-12-05 DIAGNOSIS — Z79899 Other long term (current) drug therapy: Secondary | ICD-10-CM | POA: Insufficient documentation

## 2016-12-05 DIAGNOSIS — J449 Chronic obstructive pulmonary disease, unspecified: Secondary | ICD-10-CM | POA: Insufficient documentation

## 2016-12-05 DIAGNOSIS — Z7982 Long term (current) use of aspirin: Secondary | ICD-10-CM | POA: Diagnosis not present

## 2016-12-05 DIAGNOSIS — S79912A Unspecified injury of left hip, initial encounter: Secondary | ICD-10-CM | POA: Diagnosis not present

## 2016-12-05 DIAGNOSIS — F332 Major depressive disorder, recurrent severe without psychotic features: Secondary | ICD-10-CM | POA: Diagnosis present

## 2016-12-05 DIAGNOSIS — R630 Anorexia: Secondary | ICD-10-CM

## 2016-12-05 DIAGNOSIS — M25552 Pain in left hip: Secondary | ICD-10-CM | POA: Diagnosis not present

## 2016-12-05 DIAGNOSIS — R69 Illness, unspecified: Secondary | ICD-10-CM | POA: Diagnosis not present

## 2016-12-05 DIAGNOSIS — F411 Generalized anxiety disorder: Secondary | ICD-10-CM | POA: Diagnosis present

## 2016-12-05 DIAGNOSIS — M545 Low back pain: Secondary | ICD-10-CM | POA: Diagnosis not present

## 2016-12-05 DIAGNOSIS — W19XXXA Unspecified fall, initial encounter: Secondary | ICD-10-CM | POA: Diagnosis not present

## 2016-12-05 DIAGNOSIS — N390 Urinary tract infection, site not specified: Secondary | ICD-10-CM

## 2016-12-05 LAB — URINE DRUG SCREEN, QUALITATIVE (ARMC ONLY)
Amphetamines, Ur Screen: NOT DETECTED
BARBITURATES, UR SCREEN: NOT DETECTED
Benzodiazepine, Ur Scrn: NOT DETECTED
CANNABINOID 50 NG, UR ~~LOC~~: NOT DETECTED
Cocaine Metabolite,Ur ~~LOC~~: NOT DETECTED
MDMA (Ecstasy)Ur Screen: NOT DETECTED
Methadone Scn, Ur: NOT DETECTED
Opiate, Ur Screen: NOT DETECTED
PHENCYCLIDINE (PCP) UR S: NOT DETECTED
TRICYCLIC, UR SCREEN: NOT DETECTED

## 2016-12-05 LAB — GLUCOSE, CAPILLARY: GLUCOSE-CAPILLARY: 99 mg/dL (ref 65–99)

## 2016-12-05 LAB — URINALYSIS, COMPLETE (UACMP) WITH MICROSCOPIC
Bilirubin Urine: NEGATIVE
Glucose, UA: NEGATIVE mg/dL
Ketones, ur: NEGATIVE mg/dL
Nitrite: NEGATIVE
PH: 6 (ref 5.0–8.0)
Protein, ur: NEGATIVE mg/dL
SPECIFIC GRAVITY, URINE: 1.009 (ref 1.005–1.030)

## 2016-12-05 LAB — BASIC METABOLIC PANEL
Anion gap: 10 (ref 5–15)
BUN: 14 mg/dL (ref 6–20)
CHLORIDE: 103 mmol/L (ref 101–111)
CO2: 28 mmol/L (ref 22–32)
CREATININE: 0.93 mg/dL (ref 0.44–1.00)
Calcium: 9.7 mg/dL (ref 8.9–10.3)
GFR calc Af Amer: >60 mL/min (ref >60)
GFR calc non Af Amer: 60 mL/min — ABNORMAL LOW (ref >60)
GLUCOSE: 113 mg/dL — AB (ref 65–99)
POTASSIUM: 4 mmol/L (ref 3.5–5.1)
SODIUM: 141 mmol/L (ref 135–145)

## 2016-12-05 LAB — CBC
HEMATOCRIT: 45 % (ref 35.0–47.0)
Hemoglobin: 15 g/dL (ref 12.0–16.0)
MCH: 31.3 pg (ref 26.0–34.0)
MCHC: 33.2 g/dL (ref 32.0–36.0)
MCV: 94.1 fL (ref 80.0–100.0)
PLATELETS: 220 10*3/uL (ref 150–440)
RBC: 4.78 MIL/uL (ref 3.80–5.20)
RDW: 14 % (ref 11.5–14.5)
WBC: 6.8 10*3/uL (ref 3.6–11.0)

## 2016-12-05 LAB — CK: CK TOTAL: 37 U/L — AB (ref 38–234)

## 2016-12-05 MED ORDER — ENSURE ENLIVE PO LIQD
237.0000 mL | Freq: Two times a day (BID) | ORAL | Status: DC
  Administered 2016-12-05: 237 mL via ORAL

## 2016-12-05 MED ORDER — CLONAZEPAM 0.5 MG PO TABS: 0.5000 mg | ORAL_TABLET | Freq: Three times a day (TID) | ORAL | Status: DC

## 2016-12-05 MED ORDER — CIPROFLOXACIN HCL 500 MG PO TABS: 250.0000 mg | ORAL_TABLET | Freq: Two times a day (BID) | ORAL | Status: DC

## 2016-12-05 MED ORDER — LORATADINE 10 MG PO TABS: 10.0000 mg | ORAL_TABLET | Freq: Every day | ORAL | Status: DC

## 2016-12-05 MED ORDER — CLONAZEPAM 0.5 MG PO TABS
0.5000 mg | ORAL_TABLET | Freq: Once | ORAL | Status: AC
  Administered 2016-12-05: 0.5 mg via ORAL

## 2016-12-05 MED ORDER — ESCITALOPRAM OXALATE 10 MG PO TABS: 10.0000 mg | ORAL_TABLET | Freq: Every day | ORAL | Status: DC

## 2016-12-05 MED ORDER — CLONAZEPAM 0.5 MG PO TABS
ORAL_TABLET | ORAL | Status: AC
  Administered 2016-12-05: 0.5 mg via ORAL
  Filled 2016-12-05: qty 1

## 2016-12-05 MED ORDER — MIRTAZAPINE 15 MG PO TABS: 45.0000 mg | ORAL_TABLET | Freq: Every day | ORAL | Status: DC

## 2016-12-05 NOTE — ED Notes (Signed)
Respirations equal and unlabored, pt resting quietly at this time.  Waiting for patient to go XR for shoulder.

## 2016-12-05 NOTE — ED Notes (Signed)
Pt given an ensure.

## 2016-12-05 NOTE — ED Notes (Signed)
Pt reports back pain on the left side, pt states she is in pain states she takes gabapentin and aspirin at home for pain.  Pt states she fell yesterday on to her side. Pt states she was coming from the bathroom. Pt states her husband told her "she was driving him crazy"  Pt states she feels safe at home and replied "I guess".

## 2016-12-05 NOTE — ED Notes (Addendum)
Pt reports feeling depressed and is tearful during assessment, she states she does not talk to her children. She states she had SI attempt about 10 years ago when she took a bunch of pills.  Pt states she was admitted in [place] before, but states she didn't like it there and states she was "locked up".  Pt denies any SI/HI currently.  Pt states she no longer sees her psychiatrist because they won't prescribe klonopin. Pt states she has been off klonopin for 2-4 weeks.

## 2016-12-05 NOTE — ED Provider Notes (Signed)
[facility] Emergency Department Provider Note  Time seen: 1:13 PM  I have reviewed the triage vital signs and the nursing notes.   HISTORY  Chief Complaint Fall and Weakness    HPI Beth [name] is a 71 y.o. female with a past medical history of anxiety, asthma, COPD, fibromyalgia, depression, hypertension, hyperlipidemia, CVA, presents to the emergency department for generalized weakness.  According to the patient her husband called EMS because she has been "driving him crazy."  Patient states approximately 4 weeks ago she ran out of her anxiety medication and her psychiatrist refuses to write for any more of that.  She states her doctor will not write her for the anxiety medication either.  Patient states over the past 1 week or so she has not been doing much around the house, and has been more reliant on her husband which per patient has been driving him crazy.  Patient states she has felt very weak like she has no energy.  Patient states yesterday she fell onto her buttocks and was having left hip pain today.  Has been ambulatory since the fall.  Denies taking her head.  Patient denies any SI or HI.  Past Medical History:  Diagnosis Date  . Allergy   . Anxiety   . Asthma   . COPD (chronic obstructive pulmonary disease) ([place])   . CVA (cerebral infarction)   . Depression   . Fibromyalgia   . Headache   . Hyperlipidemia   . Hypertension   . IBS (irritable bowel syndrome)   . Stroke ([place])   . Vitamin D deficiency     Patient Active Problem List   Diagnosis Date Noted  . Protein-calorie malnutrition, mild ([place]) 08/07/2016  . Generalized anxiety disorder 07/15/2016  . Chronic neck and back pain 07/15/2016  . Severe episode of recurrent major depressive disorder, without psychotic features ([place]) 07/14/2016  . Moderate benzodiazepine use disorder ([place]) 05/08/2016  . Major depressive disorder, recurrent, severe w/o psychotic behavior ([place]) 05/01/2016  .  Seasonal allergic rhinitis 03/23/2015  . Hyperglycemia 03/23/2015  . Senile purpura ([place]) 03/23/2015  . Perennial allergic rhinitis with seasonal variation 03/23/2015  . Marital problems 10/31/2014  . Migraine without aura and without status migrainosus, not intractable 10/31/2014  . Chronic LBP 08/09/2014  . Colon polyp 08/09/2014  . COPD, severe ([place]) 08/09/2014  . CVA, old, hemiparesis ([place]) 08/09/2014  . Dyslipidemia 08/09/2014  . Dysfunction of eustachian tube 08/09/2014  . Fibromyalgia syndrome 08/09/2014  . Gastro-esophageal reflux disease without esophagitis 08/09/2014  . Benign migrating glossitis 08/09/2014  . Cerebrovascular accident, old 08/09/2014  . IBS (irritable bowel syndrome) 08/09/2014  . Low back pain with radiation 08/09/2014  . Chronic recurrent major depressive disorder ([place]) 08/09/2014  . Dysmetabolic syndrome 01/30/7251  . OP (osteoporosis) 08/09/2014  . Vitamin D deficiency 08/09/2014  . Benign hypertension 07/19/2013  . Benign neoplasm of skin of trunk 06/03/2013  . H/O: pneumonia 09/25/2012    Past Surgical History:  Procedure Laterality Date  . ABDOMINAL HYSTERECTOMY    . APPENDECTOMY    . BREAST SURGERY  2011   biopsy  . CERVICAL DISCECTOMY    . CHOLECYSTECTOMY    . SINUSOTOMY      Prior to Admission medications   Medication Sig Start Date End Date Taking? Authorizing Provider  aspirin 81 MG chewable tablet Chew 81 mg daily by mouth.   Yes [provider]  atorvastatin (LIPITOR) 40 MG tablet TAKE 1 TABLET(40 MG) BY MOUTH  DAILY 06/14/16   Steele Sizer, MD  azelastine (ASTELIN) 0.1 % nasal spray Place 1 spray into both nostrils 2 (two) times daily. Use in each nostril as directed 06/14/16   Steele Sizer, MD  fluticasone (FLONASE) 50 MCG/ACT nasal spray Place 2 sprays into both nostrils daily. 09/29/15   Sowles, Drue Stager, MD  fluticasone furoate-vilanterol (BREO ELLIPTA) 100-25 MCG/INH AEPB Inhale 1 puff into the lungs daily. 10/04/16    Steele Sizer, MD  gabapentin (NEURONTIN) 300 MG capsule Take 1 capsule (300 mg total) by mouth 2 (two) times daily. 11/27/16   Steele Sizer, MD  Ipratropium-Albuterol (COMBIVENT RESPIMAT) 20-100 MCG/ACT AERS respimat Inhale 1 puff into the lungs every 6 (six) hours. 06/14/16   Steele Sizer, MD  lidocaine (XYLOCAINE) 5 % ointment Apply 2-3 grams topically to affected area 3-4 times per day. 10/21/16 03/23/17  Steele Sizer, MD  metoprolol succinate (TOPROL-XL) 25 MG 24 hr tablet Take 1 tablet (25 mg total) by mouth daily. 06/14/16   Steele Sizer, MD  Multiple Vitamin (THERA) TABS Take by mouth. 07/19/16   [provider]  omeprazole (PRILOSEC) 40 MG capsule TAKE 1 CAPSULE BY MOUTH ONCE DAILY 09/18/16   Steele Sizer, MD    Allergies  Allergen Reactions  . Bextra  [Valdecoxib]   . Compazine  [Prochlorperazine Edisylate]   . Lithium Carbonate   . Lyrica [Pregabalin]     Family History  Problem Relation Age of Onset  . Anxiety disorder Mother   . Depression Mother   . Cancer Father   . Gallbladder disease Father   . Alcohol abuse Father   . Depression Father     Social History Social History   Tobacco Use  . Smoking status: Current Some Day Smoker    Packs/day: 1.00    Years: 35.00    Pack years: 35.00    Types: Cigarettes    Last attempt to quit: 01/29/2012    Years since quitting: 4.8  . Smokeless tobacco: Never Used  . Tobacco comment: Restarted at 1/2 a pack a day.   Substance Use Topics  . Alcohol use: No    Alcohol/week: 0.0 oz  . Drug use: No    Review of Systems Constitutional: Negative for fever. Cardiovascular: Negative for chest pain. Respiratory: Negative for shortness of breath. Gastrointestinal: Negative for abdominal pain Musculoskeletal: Negative for back pain. Neurological: Negative for headache All other ROS negative  ____________________________________________   PHYSICAL EXAM:  VITAL SIGNS: ED Triage Vitals  Enc Vitals  Group     BP 12/05/16 1044 (!) 127/54     Pulse Rate 12/05/16 1044 97     Resp 12/05/16 1044 20     Temp 12/05/16 1044 98.1 F (36.7 C)     Temp Source 12/05/16 1044 Oral     SpO2 12/05/16 1044 99 %     Weight 12/05/16 1117 112 lb (50.8 kg)     Height 12/05/16 1117 5\' 5"  (1.651 m)     Head Circumference --      Peak Flow --      Pain Score 12/05/16 1044 8     Pain Loc --      Pain Edu? --      Excl. in [place]? --    Constitutional: Alert and oriented. Well appearing and in no distress. Eyes: Normal exam ENT   Head: Normocephalic and atraumatic.   Mouth/Throat: Mucous membranes are moist. Cardiovascular: Normal rate, regular rhythm. No murmur Respiratory: Normal respiratory effort without tachypnea nor retractions.  Breath sounds are clear Gastrointestinal: Soft and nontender. No distention.  Musculoskeletal: Nontender with normal range of motion in all extremities.  Neurologic:  Normal speech and language. No gross focal neurologic deficits  Skin:  Skin is warm, dry and intact.  Psychiatric: Mood and affect are normal.  ____________________________________________    EKG  EKG reviewed and interpreted by myself shows normal sinus rhythm at 99 bpm with a narrow QRS, normal axis, normal intervals, nonspecific ST changes.  ____________________________________________    RADIOLOGY  Hip x-ray negative  ____________________________________________   INITIAL IMPRESSION / ASSESSMENT AND PLAN / ED COURSE  Pertinent labs & imaging results that were available during my care of the patient were reviewed by me and considered in my medical decision making (see chart for details).  Patient presents to the emergency department for generalized fatigue and weakness, she states this has been ongoing but worse over the past 1 week.  Had a fall yesterday with left hip pain today.  Differential would include anxiety, depression, generalized weakness, fatigue, electrolyte abnormality,  dehydration, infectious etiology, hip fracture.  Patient's x-ray is negative for fracture.  Labs are largely at baseline.  Urinalysis is mostly negative, urine culture has been added on.  No signs of significant dehydration.  We will IV hydrate while in the emergency department as the patient states she has not been eating or drinking much for the past several days.  Patient agreeable to this plan of care.  Patient's workup has been largely within normal limits.  Labs are normal.  No anion gap.  CK negative/normal.  The patient is requesting something for her anxiety.  States she has not been able to eat in 2 or 3 days due to her anxiety.  Patient states that her psychiatrist stopped prescribing her anxiety medication because she took her husband's medicine.  She also states that the husband became mad at the psychiatrist after the psychiatrist threatened to tell his doctor to stop prescribing him the anxiety medication, and now will no longer see them.  Patient states she has tried to contact another psychiatrist but that psychiatrist would not see them either.  Patient states she is extremely anxious and does not know what else to do.  We will place a psychiatry consult to Dr. Weber Cooks.   Psychiatric consultation pending, patient care signed out to oncoming physician.  ____________________________________________   FINAL CLINICAL IMPRESSION(S) / ED DIAGNOSES  Generalized weakness  anxiety   Harvest Dark, MD 12/05/16 [phone]

## 2016-12-05 NOTE — ED Notes (Signed)
Pt declined staying for further inpatient psychiatric treatment.  Pt signed AMA form and husband taking pt home.  EDP referred pt to outpatient psychiatrist, and reviewed with pt's husband.  Verbalized understanding.  Pt in NAD at discharge.
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
## 2016-12-05 NOTE — ED Notes (Signed)
Dr.Clapacs at bedside  

## 2016-12-05 NOTE — ED Triage Notes (Signed)
Pt in via EMS. EMS reports per pt, she fell yesterday and had trouble getting up. EMS reports pt c/o pain to her left hip and back. EMS reports no shortening or rotation noted. Pt leaned back in the WC. EMS reports patient has been a little lethargic. EMS reports per pts husband she has been dealing with severe depression lately.

## 2016-12-05 NOTE — ED Notes (Signed)
Pt wanting to leave AMA because she does not want to dress out to go to [facility].  [facility] nurse notified.

## 2016-12-05 NOTE — ED Notes (Signed)
To patient's room, pt shaking and is sitting in a chair in the room. I asked patient why she is shaking pt states it is because of her anxiety. Informed patient that our hospital psychiatrist is going to talk to her and could maybe help her with her anxiety.  Informed patient that she would be moving to the hallway to wait for psychiatrist to talk to her.

## 2016-12-05 NOTE — ED Triage Notes (Signed)
Pt to ed via ems with reports of having a fall yesterday, denies hitting her head but c/o left side and back pain, left hip pain.

## 2016-12-05 NOTE — ED Notes (Signed)
Report given to Amy T RN

## 2016-12-05 NOTE — Discharge Instructions (Signed)
Return to the ER for evaluation if you change her mind and are agreeable to admission.  Return for worsening symptoms.

## 2016-12-05 NOTE — ED Provider Notes (Signed)
Was called bedside as patient is requesting to leave.  Reviewed notes and patient has been recommended for inpatient stay by psychiatry but is not meeting criteria for involuntary commitment.  Patient denies any SI or HI.  Symptoms seem to be most consistent with severe anxiety.  Is been at the bedside and taking patient home.  Will give referral.  Patient is leaving [place] and demonstrates understanding that leaving against the recommendations of psychiatrist could result in worsening of her psychiatric condition.     Merlyn Lot, MD 12/05/16 [phone]

## 2016-12-07 LAB — URINE CULTURE: Culture: <10000 — AB

## 2016-12-09 ENCOUNTER — Other Ambulatory Visit: Payer: Self-pay

## 2016-12-09 ENCOUNTER — Emergency Department
Admission: EM | Admit: 2016-12-09 | Discharge: 2016-12-10 | Disposition: A | Discharge status: Other Acute Inpatient Hospital | Payer: Medicare HMO | Attending: Emergency Medicine | Admitting: Emergency Medicine

## 2016-12-09 DIAGNOSIS — R69 Illness, unspecified: Secondary | ICD-10-CM | POA: Diagnosis not present

## 2016-12-09 DIAGNOSIS — Z7982 Long term (current) use of aspirin: Secondary | ICD-10-CM | POA: Diagnosis not present

## 2016-12-09 DIAGNOSIS — Z8673 Personal history of transient ischemic attack (TIA), and cerebral infarction without residual deficits: Secondary | ICD-10-CM | POA: Insufficient documentation

## 2016-12-09 DIAGNOSIS — I10 Essential (primary) hypertension: Secondary | ICD-10-CM | POA: Insufficient documentation

## 2016-12-09 DIAGNOSIS — F332 Major depressive disorder, recurrent severe without psychotic features: Secondary | ICD-10-CM | POA: Insufficient documentation

## 2016-12-09 DIAGNOSIS — F418 Other specified anxiety disorders: Secondary | ICD-10-CM | POA: Diagnosis not present

## 2016-12-09 DIAGNOSIS — Z79899 Other long term (current) drug therapy: Secondary | ICD-10-CM | POA: Diagnosis not present

## 2016-12-09 DIAGNOSIS — R45851 Suicidal ideations: Secondary | ICD-10-CM | POA: Diagnosis not present

## 2016-12-09 DIAGNOSIS — J449 Chronic obstructive pulmonary disease, unspecified: Secondary | ICD-10-CM | POA: Diagnosis not present

## 2016-12-09 DIAGNOSIS — J45909 Unspecified asthma, uncomplicated: Secondary | ICD-10-CM | POA: Insufficient documentation

## 2016-12-09 DIAGNOSIS — F17210 Nicotine dependence, cigarettes, uncomplicated: Secondary | ICD-10-CM | POA: Insufficient documentation

## 2016-12-09 DIAGNOSIS — Z046 Encounter for general psychiatric examination, requested by authority: Secondary | ICD-10-CM | POA: Diagnosis present

## 2016-12-09 LAB — COMPREHENSIVE METABOLIC PANEL
ALK PHOS: 91 U/L (ref 38–126)
ALT: 13 U/L — AB (ref 14–54)
ANION GAP: 12 (ref 5–15)
AST: 29 U/L (ref 15–41)
Albumin: 4.1 g/dL (ref 3.5–5.0)
BILIRUBIN TOTAL: 0.7 mg/dL (ref 0.3–1.2)
BUN: 12 mg/dL (ref 6–20)
CALCIUM: 9.8 mg/dL (ref 8.9–10.3)
CO2: 26 mmol/L (ref 22–32)
CREATININE: 1.09 mg/dL — AB (ref 0.44–1.00)
Chloride: 103 mmol/L (ref 101–111)
GFR calc Af Amer: 57 mL/min — ABNORMAL LOW (ref >60)
GFR calc non Af Amer: 49 mL/min — ABNORMAL LOW (ref >60)
GLUCOSE: 98 mg/dL (ref 65–99)
Potassium: 3.6 mmol/L (ref 3.5–5.1)
Sodium: 141 mmol/L (ref 135–145)
TOTAL PROTEIN: 7.5 g/dL (ref 6.5–8.1)

## 2016-12-09 LAB — CBC
HEMATOCRIT: 44.8 % (ref 35.0–47.0)
Hemoglobin: 14.8 g/dL (ref 12.0–16.0)
MCH: 31.5 pg (ref 26.0–34.0)
MCHC: 33.1 g/dL (ref 32.0–36.0)
MCV: 95.3 fL (ref 80.0–100.0)
Platelets: 275 10*3/uL (ref 150–440)
RBC: 4.7 MIL/uL (ref 3.80–5.20)
RDW: 14.2 % (ref 11.5–14.5)
WBC: 8.9 10*3/uL (ref 3.6–11.0)

## 2016-12-09 LAB — ETHANOL: Alcohol, Ethyl (B): <10 mg/dL (ref <10)

## 2016-12-09 LAB — SALICYLATE LEVEL: Salicylate Lvl: 9.9 mg/dL (ref 2.8–30.0)

## 2016-12-09 LAB — ACETAMINOPHEN LEVEL

## 2016-12-09 NOTE — ED Notes (Signed)
Pt states she not been able to eat much lately. Pt provided with [place] sandwich tray with crackers and applesauce.

## 2016-12-09 NOTE — ED Notes (Addendum)
Notified that pt was accepted into [facility]. No transportation available tonight but pt should be moved in the morning.

## 2016-12-09 NOTE — ED Provider Notes (Signed)
[facility] Emergency Department Provider Note   ____________________________________________   First MD Initiated Contact with Patient 12/09/16 2237     (approximate)  I have reviewed the triage vital signs and the nursing notes.   HISTORY  Chief Complaint Psychiatric Evaluation   HPI Beth [name] is a 71 y.o. female who comes from [place]. She has been seen there and is committed. She is waiting for ride to [facility]. [facility] was unable to provide one. Here patient says she is not feeling well she's anxious. She is drinking Coca-Cola and says she thinks she may be dehydrated.   Past Medical History:  Diagnosis Date  . Allergy   . Anxiety   . Asthma   . COPD (chronic obstructive pulmonary disease) ([place])   . CVA (cerebral infarction)   . Depression   . Fibromyalgia   . Headache   . Hyperlipidemia   . Hypertension   . IBS (irritable bowel syndrome)   . Stroke ([place])   . Vitamin D deficiency     Patient Active Problem List   Diagnosis Date Noted  . Urinary tract infection 12/05/2016  . Anorexia 12/05/2016  . Protein-calorie malnutrition, mild ([place]) 08/07/2016  . Generalized anxiety disorder 07/15/2016  . Chronic neck and back pain 07/15/2016  . Severe episode of recurrent major depressive disorder, without psychotic features ([place]) 07/14/2016  . Moderate benzodiazepine use disorder ([place]) 05/08/2016  . Major depressive disorder, recurrent, severe w/o psychotic behavior ([place]) 05/01/2016  . Seasonal allergic rhinitis 03/23/2015  . Hyperglycemia 03/23/2015  . Senile purpura ([place]) 03/23/2015  . Perennial allergic rhinitis with seasonal variation 03/23/2015  . Marital problems 10/31/2014  . Migraine without aura and without status migrainosus, not intractable 10/31/2014  . Chronic LBP 08/09/2014  . Colon polyp 08/09/2014  . COPD, severe ([place]) 08/09/2014  . CVA, old, hemiparesis ([place]) 08/09/2014  . Dyslipidemia 08/09/2014  . Dysfunction of  eustachian tube 08/09/2014  . Fibromyalgia syndrome 08/09/2014  . Gastro-esophageal reflux disease without esophagitis 08/09/2014  . Benign migrating glossitis 08/09/2014  . Cerebrovascular accident, old 08/09/2014  . IBS (irritable bowel syndrome) 08/09/2014  . Low back pain with radiation 08/09/2014  . Chronic recurrent major depressive disorder ([place]) 08/09/2014  . Dysmetabolic syndrome 50/93/2671  . OP (osteoporosis) 08/09/2014  . Vitamin D deficiency 08/09/2014  . Benign hypertension 07/19/2013  . Benign neoplasm of skin of trunk 06/03/2013  . H/O: pneumonia 09/25/2012    Past Surgical History:  Procedure Laterality Date  . ABDOMINAL HYSTERECTOMY    . APPENDECTOMY    . BREAST SURGERY  2011   biopsy  . CERVICAL DISCECTOMY    . CHOLECYSTECTOMY    . SINUSOTOMY      Prior to Admission medications   Medication Sig Start Date End Date Taking? Authorizing Provider  aspirin 81 MG chewable tablet Chew 81 mg daily by mouth.    [provider]  atorvastatin (LIPITOR) 40 MG tablet TAKE 1 TABLET(40 MG) BY MOUTH DAILY Patient not taking: Reported on 12/05/2016 06/14/16   Steele Sizer, MD  azelastine (ASTELIN) 0.1 % nasal spray Place 1 spray into both nostrils 2 (two) times daily. Use in each nostril as directed 06/14/16   Steele Sizer, MD  fluticasone (FLONASE) 50 MCG/ACT nasal spray Place 2 sprays into both nostrils daily. Patient not taking: Reported on 12/05/2016 09/29/15   Steele Sizer, MD  fluticasone furoate-vilanterol (BREO ELLIPTA) 100-25 MCG/INH AEPB Inhale 1 puff into the lungs daily. 10/04/16   Steele Sizer, MD  gabapentin (NEURONTIN) 300 MG capsule Take 1 capsule (300 mg total) by mouth 2 (two) times daily. 11/27/16   Steele Sizer, MD  Ipratropium-Albuterol (COMBIVENT RESPIMAT) 20-100 MCG/ACT AERS respimat Inhale 1 puff into the lungs every 6 (six) hours. 06/14/16   Steele Sizer, MD  lidocaine (XYLOCAINE) 5 % ointment Apply 2-3 grams topically to affected  area 3-4 times per day. 10/21/16 03/23/17  Steele Sizer, MD  metoprolol succinate (TOPROL-XL) 25 MG 24 hr tablet Take 1 tablet (25 mg total) by mouth daily. 06/14/16   Steele Sizer, MD  mirtazapine (REMERON) 15 MG tablet Take 1 tablet daily by mouth. 10/21/16   [provider]  Multiple Vitamin (THERA) TABS Take by mouth. 07/19/16   [provider]  omeprazole (PRILOSEC) 40 MG capsule TAKE 1 CAPSULE BY MOUTH ONCE DAILY Patient not taking: Reported on 12/05/2016 09/18/16   Steele Sizer, MD    Allergies Bextra  [valdecoxib]; Compazine  [prochlorperazine edisylate]; Lithium carbonate; and Lyrica [pregabalin]  Family History  Problem Relation Age of Onset  . Anxiety disorder Mother   . Depression Mother   . Cancer Father   . Gallbladder disease Father   . Alcohol abuse Father   . Depression Father     Social History Social History   Tobacco Use  . Smoking status: Current Some Day Smoker    Packs/day: 1.00    Years: 35.00    Pack years: 35.00    Types: Cigarettes    Last attempt to quit: 01/29/2012    Years since quitting: 4.8  . Smokeless tobacco: Never Used  . Tobacco comment: Restarted at 1/2 a pack a day.   Substance Use Topics  . Alcohol use: No    Alcohol/week: 0.0 oz  . Drug use: No    Review of Systems  Constitutional: No fever/chills Eyes: No visual changes. ENT: No sore throat. Cardiovascular: Denies chest pain. Respiratory: Denies shortness of breath. Gastrointestinal: No abdominal pain.  No nausea, no vomiting.  No diarrhea.  No constipation. Genitourinary: Negative for dysuria. Musculoskeletal: Negative for back pain. Skin: Negative for rash. Neurological: Negative for headaches, focal weakness   ____________________________________________   PHYSICAL EXAM:  VITAL SIGNS: ED Triage Vitals  Enc Vitals Group     BP 12/09/16 1905 137/60     Pulse Rate 12/09/16 1905 98     Resp 12/09/16 1905 20     Temp 12/09/16 1905 97.8 F (36.6  C)     Temp Source 12/09/16 1905 Oral     SpO2 12/09/16 1905 97 %     Weight --      Height --      Head Circumference --      Peak Flow --      Pain Score 12/09/16 1904 7     Pain Loc --      Pain Edu? --      Excl. in [place]? --     Constitutional: Alert and oriented. Well appearing and in no acute distress. Eyes: Conjunctivae are normal.  Head: Atraumatic. Nose: No congestion/rhinnorhea. Mouth/Throat: Mucous membranes are moist.  Oropharynx non-erythematous. Neck: No stridor.   Cardiovascular: Normal rate, regular rhythm. Grossly normal heart sounds.  Good peripheral circulation. Respiratory: Normal respiratory effort.  No retractions. Lungs CTAB. Gastrointestinal: Soft and nontender. No distention. No abdominal bruits. No CVA tenderness. Musculoskeletal: No lower extremity tenderness nor edema.  No joint effusions. Neurologic:  Normal speech and language.patient seems very anxious and shaky the more I talk to her shaky  if she gets. Skin:  Skin is warm, dry and intact. No rash noted. Psychiatric:very anxious.  ____________________________________________   LABS (all labs ordered are listed, but only abnormal results are displayed)  Labs Reviewed  COMPREHENSIVE METABOLIC PANEL - Abnormal; Notable for the following components:      Result Value   Creatinine, Ser 1.09 (*)    ALT 13 (*)    GFR calc non Af Amer 49 (*)    GFR calc Af Amer 57 (*)    All other components within normal limits  ACETAMINOPHEN LEVEL - Abnormal; Notable for the following components:   Acetaminophen (Tylenol), Serum <10 (*)    All other components within normal limits  ETHANOL  SALICYLATE LEVEL  CBC  URINE DRUG SCREEN, QUALITATIVE ([facility] ONLY)   ____________________________________________  EKG   ____________________________________________  RADIOLOGY  ____________________________________________   PROCEDURES  Procedure(s) performed:  Procedures  Critical Care performed:    ____________________________________________   INITIAL IMPRESSION / ASSESSMENT AND PLAN / ED COURSE  As part of my medical decision making, I reviewed the following data within the [place] from [place] reviewed in detail. Will watch the patient in the ride [facility] gets here.      ____________________________________________   FINAL CLINICAL IMPRESSION(S) / ED DIAGNOSES  Final diagnoses:  Suicidal ideation     ED Discharge Orders    None       Note:  This document was prepared using Dragon voice recognition software and may include unintentional dictation errors.    Nena Polio, MD 12/09/16 [phone]

## 2016-12-09 NOTE — ED Triage Notes (Signed)
Patient to ED with [facility] PD under IVC.  Papers state mentally ill and a danger to self or others.  Patient states she doesn't know why she is here.  Chemical engineer states he did not receive any type of report on patient.   Patient came from [place].

## 2016-12-09 NOTE — ED Notes (Signed)
Patient has been accepted to [facility].  Patient assigned to room in [place] is Dr. Unknown Jim.  Call report to [phone].  Representative was Santiago Glad from [employer] spoke with Sharyn Lull at [employer].  ER Staff is aware of it [facility].; Dr. Cinda Quest, ER MD & Seth Bake Patient's Nurse)

## 2016-12-10 DIAGNOSIS — E7800 Pure hypercholesterolemia, unspecified: Secondary | ICD-10-CM | POA: Diagnosis not present

## 2016-12-10 DIAGNOSIS — R45851 Suicidal ideations: Secondary | ICD-10-CM | POA: Diagnosis not present

## 2016-12-10 DIAGNOSIS — Z7982 Long term (current) use of aspirin: Secondary | ICD-10-CM | POA: Diagnosis not present

## 2016-12-10 DIAGNOSIS — J449 Chronic obstructive pulmonary disease, unspecified: Secondary | ICD-10-CM | POA: Diagnosis not present

## 2016-12-10 DIAGNOSIS — F418 Other specified anxiety disorders: Secondary | ICD-10-CM | POA: Diagnosis not present

## 2016-12-10 DIAGNOSIS — Z8673 Personal history of transient ischemic attack (TIA), and cerebral infarction without residual deficits: Secondary | ICD-10-CM | POA: Diagnosis not present

## 2016-12-10 DIAGNOSIS — Z79899 Other long term (current) drug therapy: Secondary | ICD-10-CM | POA: Diagnosis not present

## 2016-12-10 DIAGNOSIS — F332 Major depressive disorder, recurrent severe without psychotic features: Secondary | ICD-10-CM | POA: Diagnosis not present

## 2016-12-10 DIAGNOSIS — M549 Dorsalgia, unspecified: Secondary | ICD-10-CM | POA: Diagnosis not present

## 2016-12-10 DIAGNOSIS — F17210 Nicotine dependence, cigarettes, uncomplicated: Secondary | ICD-10-CM | POA: Diagnosis not present

## 2016-12-10 DIAGNOSIS — K219 Gastro-esophageal reflux disease without esophagitis: Secondary | ICD-10-CM | POA: Diagnosis not present

## 2016-12-10 DIAGNOSIS — I10 Essential (primary) hypertension: Secondary | ICD-10-CM | POA: Diagnosis not present

## 2016-12-10 DIAGNOSIS — R69 Illness, unspecified: Secondary | ICD-10-CM | POA: Diagnosis not present

## 2016-12-10 DIAGNOSIS — J45909 Unspecified asthma, uncomplicated: Secondary | ICD-10-CM | POA: Diagnosis not present

## 2016-12-10 DIAGNOSIS — F411 Generalized anxiety disorder: Secondary | ICD-10-CM | POA: Diagnosis not present

## 2016-12-10 LAB — URINE DRUG SCREEN, QUALITATIVE (ARMC ONLY)
Amphetamines, Ur Screen: NOT DETECTED
BARBITURATES, UR SCREEN: NOT DETECTED
Benzodiazepine, Ur Scrn: POSITIVE — AB
COCAINE METABOLITE, UR ~~LOC~~: NOT DETECTED
Cannabinoid 50 Ng, Ur ~~LOC~~: NOT DETECTED
MDMA (Ecstasy)Ur Screen: NOT DETECTED
METHADONE SCREEN, URINE: NOT DETECTED
OPIATE, UR SCREEN: NOT DETECTED
Phencyclidine (PCP) Ur S: NOT DETECTED
Tricyclic, Ur Screen: NOT DETECTED

## 2016-12-10 NOTE — ED Notes (Signed)
EMTALA reviewed. 

## 2016-12-10 NOTE — ED Notes (Addendum)
Pt given only 1 of 2 belongings bags. Second bag found. [place] notified. pts husband unable to take to pt. I will take to [place] this PM after work. [place] notified. Contents of bag are misc clothes and under garments, purse with various cards, DL, makeup, smokes, lighters.   No money, coins, rings, jewelry of any kind, watches, cell phone, hearing aids, electronics of any kind, no medications of any kind, nothing with monetary value more than $5-$10 dollars.  Beth [name], NT witnessed search of belongings.   Note: second belongings bag taken to [facility] 12/10/16 2040 and given to Tim to give to pt.

## 2016-12-10 NOTE — ED Notes (Signed)
Pt belonging bag sent with [facility] sheriff

## 2016-12-10 NOTE — ED Provider Notes (Signed)
-----------------------------------------   8:39 AM on 12/10/2016 -----------------------------------------   Blood pressure 130/72, pulse (!) 101, temperature 97.8 F (36.6 C), temperature source Oral, resp. rate 18, SpO2 95 %.  The patient had no acute events since last update.  Calm and cooperative at this time.  Patient reassessed and remains hemodynamically stable and appropriate transfer to [facility].    Merlyn Lot, MD 12/10/16 [phone]

## 2016-12-18 ENCOUNTER — Emergency Department
Admission: EM | Admit: 2016-12-18 | Discharge: 2016-12-18 | Disposition: A | Discharge status: Home / Self Care | Payer: Medicare HMO | Attending: Emergency Medicine | Admitting: Emergency Medicine

## 2016-12-18 ENCOUNTER — Encounter: Payer: Self-pay | Admitting: Emergency Medicine

## 2016-12-18 ENCOUNTER — Other Ambulatory Visit: Payer: Self-pay

## 2016-12-18 DIAGNOSIS — I10 Essential (primary) hypertension: Secondary | ICD-10-CM | POA: Diagnosis not present

## 2016-12-18 DIAGNOSIS — Z79899 Other long term (current) drug therapy: Secondary | ICD-10-CM | POA: Insufficient documentation

## 2016-12-18 DIAGNOSIS — J449 Chronic obstructive pulmonary disease, unspecified: Secondary | ICD-10-CM | POA: Diagnosis not present

## 2016-12-18 DIAGNOSIS — N3000 Acute cystitis without hematuria: Secondary | ICD-10-CM | POA: Diagnosis not present

## 2016-12-18 DIAGNOSIS — F17210 Nicotine dependence, cigarettes, uncomplicated: Secondary | ICD-10-CM | POA: Diagnosis not present

## 2016-12-18 DIAGNOSIS — R569 Unspecified convulsions: Secondary | ICD-10-CM | POA: Diagnosis not present

## 2016-12-18 DIAGNOSIS — Z7982 Long term (current) use of aspirin: Secondary | ICD-10-CM | POA: Diagnosis not present

## 2016-12-18 DIAGNOSIS — R202 Paresthesia of skin: Secondary | ICD-10-CM | POA: Diagnosis not present

## 2016-12-18 DIAGNOSIS — Z8673 Personal history of transient ischemic attack (TIA), and cerebral infarction without residual deficits: Secondary | ICD-10-CM | POA: Insufficient documentation

## 2016-12-18 DIAGNOSIS — J45909 Unspecified asthma, uncomplicated: Secondary | ICD-10-CM | POA: Diagnosis not present

## 2016-12-18 DIAGNOSIS — G8929 Other chronic pain: Secondary | ICD-10-CM | POA: Diagnosis not present

## 2016-12-18 DIAGNOSIS — R200 Anesthesia of skin: Secondary | ICD-10-CM

## 2016-12-18 DIAGNOSIS — R69 Illness, unspecified: Secondary | ICD-10-CM | POA: Diagnosis not present

## 2016-12-18 LAB — URINALYSIS, COMPLETE (UACMP) WITH MICROSCOPIC
BACTERIA UA: NONE SEEN
BILIRUBIN URINE: NEGATIVE
Glucose, UA: NEGATIVE mg/dL
HGB URINE DIPSTICK: NEGATIVE
Ketones, ur: 20 mg/dL — AB
NITRITE: NEGATIVE
PROTEIN: 30 mg/dL — AB
SPECIFIC GRAVITY, URINE: 1.017 (ref 1.005–1.030)
pH: 6 (ref 5.0–8.0)

## 2016-12-18 LAB — CBC WITH DIFFERENTIAL/PLATELET
BASOS ABS: 0.1 10*3/uL (ref 0–0.1)
BASOS PCT: 1 %
EOS ABS: 0.1 10*3/uL (ref 0–0.7)
Eosinophils Relative: 1 %
HEMATOCRIT: 41.4 % (ref 35.0–47.0)
HEMOGLOBIN: 14 g/dL (ref 12.0–16.0)
Lymphocytes Relative: 26 %
Lymphs Abs: 2.5 10*3/uL (ref 1.0–3.6)
MCH: 31.6 pg (ref 26.0–34.0)
MCHC: 33.8 g/dL (ref 32.0–36.0)
MCV: 93.6 fL (ref 80.0–100.0)
MONO ABS: 0.7 10*3/uL (ref 0.2–0.9)
MONOS PCT: 7 %
NEUTROS ABS: 6.2 10*3/uL (ref 1.4–6.5)
NEUTROS PCT: 65 %
Platelets: 261 10*3/uL (ref 150–440)
RBC: 4.43 MIL/uL (ref 3.80–5.20)
RDW: 13.6 % (ref 11.5–14.5)
WBC: 9.5 10*3/uL (ref 3.6–11.0)

## 2016-12-18 LAB — COMPREHENSIVE METABOLIC PANEL
ALK PHOS: 80 U/L (ref 38–126)
ALT: 17 U/L (ref 14–54)
ANION GAP: 10 (ref 5–15)
AST: 22 U/L (ref 15–41)
Albumin: 4 g/dL (ref 3.5–5.0)
BILIRUBIN TOTAL: 0.8 mg/dL (ref 0.3–1.2)
BUN: 20 mg/dL (ref 6–20)
CALCIUM: 9.5 mg/dL (ref 8.9–10.3)
CO2: 27 mmol/L (ref 22–32)
CREATININE: 0.68 mg/dL (ref 0.44–1.00)
Chloride: 105 mmol/L (ref 101–111)
GFR calc non Af Amer: >60 mL/min (ref >60)
Glucose, Bld: 104 mg/dL — ABNORMAL HIGH (ref 65–99)
Potassium: 4 mmol/L (ref 3.5–5.1)
Sodium: 142 mmol/L (ref 135–145)
TOTAL PROTEIN: 7.6 g/dL (ref 6.5–8.1)

## 2016-12-18 MED ORDER — CEPHALEXIN 500 MG PO CAPS: 500.0000 mg | ORAL_CAPSULE | Freq: Three times a day (TID) | ORAL | 0 refills | Status: AC

## 2016-12-18 NOTE — ED Notes (Signed)
This RN attempted x 2 for IV access.  

## 2016-12-18 NOTE — ED Provider Notes (Signed)
[facility] Emergency Department Provider Note  ____________________________________________   First MD Initiated Contact with Patient 12/18/16 1532     (approximate)  I have reviewed the triage vital signs and the nursing notes.   HISTORY  Chief Complaint Medication Reaction and Weakness   HPI Beth [name] is a 71 y.o. female who was sent to the emergency department by her therapist for tongue tingling for the past 24 hours as well as about 5 days of peri-aural tingling.  Patient recently began taking Remeron and Effexor about a week ago for depression.  She has never had this tingling before.  She said that she did not want to come to the hospital today but that her therapist made her.  Her symptoms had an insidious onset and has been constant ever since.  Nothing seems to make it better or worse.  There is severity is mild.  She denies headache double vision blurred vision chest pain shortness of breath nausea vomiting numbness or weakness.  Past Medical History:  Diagnosis Date  . Allergy   . Anxiety   . Asthma   . COPD (chronic obstructive pulmonary disease) ([place])   . CVA (cerebral infarction)   . Depression   . Fibromyalgia   . Headache   . Hyperlipidemia   . Hypertension   . IBS (irritable bowel syndrome)   . Stroke ([place])   . Vitamin D deficiency     Patient Active Problem List   Diagnosis Date Noted  . Urinary tract infection 12/05/2016  . Anorexia 12/05/2016  . Protein-calorie malnutrition, mild ([place]) 08/07/2016  . Generalized anxiety disorder 07/15/2016  . Chronic neck and back pain 07/15/2016  . Severe episode of recurrent major depressive disorder, without psychotic features ([place]) 07/14/2016  . Moderate benzodiazepine use disorder ([place]) 05/08/2016  . Major depressive disorder, recurrent, severe w/o psychotic behavior ([place]) 05/01/2016  . Seasonal allergic rhinitis 03/23/2015  . Hyperglycemia 03/23/2015  . Senile purpura ([place])  03/23/2015  . Perennial allergic rhinitis with seasonal variation 03/23/2015  . Marital problems 10/31/2014  . Migraine without aura and without status migrainosus, not intractable 10/31/2014  . Chronic LBP 08/09/2014  . Colon polyp 08/09/2014  . COPD, severe ([place]) 08/09/2014  . CVA, old, hemiparesis ([place]) 08/09/2014  . Dyslipidemia 08/09/2014  . Dysfunction of eustachian tube 08/09/2014  . Fibromyalgia syndrome 08/09/2014  . Gastro-esophageal reflux disease without esophagitis 08/09/2014  . Benign migrating glossitis 08/09/2014  . Cerebrovascular accident, old 08/09/2014  . IBS (irritable bowel syndrome) 08/09/2014  . Low back pain with radiation 08/09/2014  . Chronic recurrent major depressive disorder ([place]) 08/09/2014  . Dysmetabolic syndrome 63/14/9702  . OP (osteoporosis) 08/09/2014  . Vitamin D deficiency 08/09/2014  . Benign hypertension 07/19/2013  . Benign neoplasm of skin of trunk 06/03/2013  . H/O: pneumonia 09/25/2012    Past Surgical History:  Procedure Laterality Date  . ABDOMINAL HYSTERECTOMY    . APPENDECTOMY    . BREAST SURGERY  2011   biopsy  . CERVICAL DISCECTOMY    . CHOLECYSTECTOMY    . SINUSOTOMY      Prior to Admission medications   Medication Sig Start Date End Date Taking? Authorizing Provider  acetaminophen (TYLENOL) 325 MG tablet Take 650 mg by mouth every 4 (four) hours as needed.   Yes [provider]  aspirin 81 MG chewable tablet Chew 81 mg daily by mouth.   Yes [provider]  atorvastatin (LIPITOR) 40 MG tablet TAKE 1 TABLET(40 MG) BY  MOUTH DAILY 06/14/16  Yes Sowles, Drue Stager, MD  fluticasone furoate-vilanterol (BREO ELLIPTA) 100-25 MCG/INH AEPB Inhale 1 puff into the lungs daily. 10/04/16  Yes Sowles, Drue Stager, MD  gabapentin (NEURONTIN) 300 MG capsule Take 1 capsule (300 mg total) by mouth 2 (two) times daily. 11/27/16  Yes Sowles, Drue Stager, MD  hydrOXYzine (VISTARIL) 50 MG capsule Take 50 mg by mouth every 6 (six) hours as  needed.   Yes [provider]  metoprolol succinate (TOPROL-XL) 25 MG 24 hr tablet Take 1 tablet (25 mg total) by mouth daily. 06/14/16  Yes Sowles, Drue Stager, MD  mirtazapine (REMERON) 30 MG tablet Take 1 tablet daily by mouth. 10/21/16  Yes [provider]  Multiple Vitamin (THERA) TABS Take by mouth. 07/19/16  Yes [provider]  nicotine (NICODERM CQ - DOSED IN MG/24 HOURS) 21 mg/24hr patch Place 21 mg onto the skin daily.   Yes [provider]  omeprazole (PRILOSEC) 40 MG capsule TAKE 1 CAPSULE BY MOUTH ONCE DAILY 09/18/16  Yes Sowles, Drue Stager, MD  venlafaxine (EFFEXOR) 37.5 MG tablet Take 37.5 mg by mouth 2 (two) times daily.   Yes [provider]  azelastine (ASTELIN) 0.1 % nasal spray Place 1 spray into both nostrils 2 (two) times daily. Use in each nostril as directed Patient not taking: Reported on 12/18/2016 06/14/16   Steele Sizer, MD  cephALEXin (KEFLEX) 500 MG capsule Take 1 capsule (500 mg total) by mouth 3 (three) times daily for 5 days. 12/18/16 12/23/16  Darel Hong, MD  fluticasone (FLONASE) 50 MCG/ACT nasal spray Place 2 sprays into both nostrils daily. Patient not taking: Reported on 12/05/2016 09/29/15   Steele Sizer, MD  Ipratropium-Albuterol (COMBIVENT RESPIMAT) 20-100 MCG/ACT AERS respimat Inhale 1 puff into the lungs every 6 (six) hours. Patient not taking: Reported on 12/18/2016 06/14/16   Steele Sizer, MD  lidocaine (XYLOCAINE) 5 % ointment Apply 2-3 grams topically to affected area 3-4 times per day. Patient not taking: Reported on 12/18/2016 10/21/16 03/23/17  Steele Sizer, MD    Allergies Bextra  [valdecoxib]; Compazine  [prochlorperazine edisylate]; Lithium carbonate; and Lyrica [pregabalin]  Family History  Problem Relation Age of Onset  . Anxiety disorder Mother   . Depression Mother   . Cancer Father   . Gallbladder disease Father   . Alcohol abuse Father   . Depression Father     Social History Social  History   Tobacco Use  . Smoking status: Current Some Day Smoker    Packs/day: 1.00    Years: 35.00    Pack years: 35.00    Types: Cigarettes    Last attempt to quit: 01/29/2012    Years since quitting: 4.8  . Smokeless tobacco: Never Used  . Tobacco comment: Restarted at 1/2 a pack a day.   Substance Use Topics  . Alcohol use: No    Alcohol/week: 0.0 oz  . Drug use: No    Review of Systems Constitutional: No fever/chills ENT: No sore throat. Cardiovascular: Denies chest pain. Respiratory: Denies shortness of breath. Gastrointestinal: No abdominal pain.  No nausea, no vomiting.  No diarrhea.  No constipation. Musculoskeletal: Negative for back pain. Neurological: Positive for numbness   ____________________________________________   PHYSICAL EXAM:  VITAL SIGNS: ED Triage Vitals  Enc Vitals Group     BP 12/18/16 1446 (!) 145/75     Pulse Rate 12/18/16 1446 90     Resp 12/18/16 1446 18     Temp 12/18/16 1446 98.9 F (37.2 C)  Temp Source 12/18/16 1446 Oral     SpO2 12/18/16 1446 97 %     Weight 12/18/16 1446 112 lb (50.8 kg)     Height 12/18/16 1446 5\' 5"  (1.651 m)     Head Circumference --      Peak Flow --      Pain Score 12/18/16 1445 9     Pain Loc --      Pain Edu? --      Excl. in [place]? --     Constitutional: Alert and oriented x4 pleasant cooperative Head: Atraumatic. Nose: No congestion/rhinnorhea. Mouth/Throat: No trismus Neck: No stridor.   Cardiovascular: Regular rate and rhythm Respiratory: Normal respiratory effort.  No retractions. Gastrointestinal: Soft nontender Neurologic:  Normal speech and language.  Resting tremor.  Perioral numbness 5 out of 5 grips biceps triceps hip flexion knee extension plantar flexion dorsiflexion  Skin:  Skin is warm, dry and intact. No rash noted.    ____________________________________________  LABS (all labs ordered are listed, but only abnormal results are displayed)  Labs Reviewed  COMPREHENSIVE  METABOLIC PANEL - Abnormal; Notable for the following components:      Result Value   Glucose, Bld 104 (*)    All other components within normal limits  URINALYSIS, COMPLETE (UACMP) WITH MICROSCOPIC - Abnormal; Notable for the following components:   Color, Urine YELLOW (*)    APPearance CLOUDY (*)    Ketones, ur 20 (*)    Protein, ur 30 (*)    Leukocytes, UA LARGE (*)    Squamous Epithelial / LPF 6-30 (*)    All other components within normal limits  CBC WITH DIFFERENTIAL/PLATELET    Blood work reviewed by me shows possible urinary tract infection __________________________________________  EKG  ED ECG REPORT I, Darel Hong, the attending physician, personally viewed and interpreted this ECG.  Date: 12/18/2016 EKG Time:  Rate: 82 Rhythm: normal sinus rhythm QRS Axis: normal Intervals: Short PR ST/T Wave abnormalities: normal Narrative Interpretation: no evidence of acute ischemia  ____________________________________________  RADIOLOGY   ____________________________________________   DIFFERENTIAL includes but not limited to  Allergic reaction, metabolic derangement, hypocalcemia, angioedema, anaphylaxis   PROCEDURES  Procedure(s) performed: no  Procedures  Critical Care performed: no  Observation: no ____________________________________________   INITIAL IMPRESSION / ASSESSMENT AND PLAN / ED COURSE  Pertinent labs & imaging results that were available during my care of the patient were reviewed by me and considered in my medical decision making (see chart for details).  The patient arrives hemodynamically stable and very well-appearing.  She did not want to come to the emergency department today however 911 was called for her by her therapist.  She does report some perioral tingling for the past 5 days or so along with some tongue discomfort.  She has no trismus her oropharynx is clear.  She does have a baseline tremor.  The rest of her exam is  unremarkable.  With this is possibly a reaction to her new medications.  Blood work is pending.     Patient's blood work is unremarkable with no metabolic derangement.  Her urinalysis is equivocal but could represent a urinary tract infection.  We will treat her with Keflex 500 mg by mouth 3 times a day for 5 days.  She is referred back to her primary care in 2 days.  Patient verbalized understanding and agreement with plan. ____________________________________________   FINAL CLINICAL IMPRESSION(S) / ED DIAGNOSES  Final diagnoses:  Numbness and tingling  Acute cystitis without  hematuria      NEW MEDICATIONS STARTED DURING THIS VISIT:  This SmartLink is deprecated. Use AVSMEDLIST instead to display the medication list for a patient.   Note:  This document was prepared using Dragon voice recognition software and may include unintentional dictation errors.      Darel Hong, MD 12/18/16 1650

## 2016-12-18 NOTE — ED Triage Notes (Signed)
Pt presents to ED via [facility] from a behavioral health appt. Per EMS pt started Remeron and Effexor approx 1 week ago. Since then pt has had lip tingling, shaking, and generalized weakness. On arrival arrival pt states recent admission for major depression, denies SI/HI at this time.

## 2016-12-18 NOTE — Discharge Instructions (Signed)
Fortunately today your blood work was very reassuring.  Urinalysis shows a possible urinary tract infection so please do take antibiotics for the next 5 days.  Follow-up with your primary care physician in 2 days for recheck.  Return to the emergency department for any concerns.  It was a pleasure to take care of you today, and thank you for coming to our emergency department.  If you have any questions or concerns before leaving please ask the nurse to grab me and I'm more than happy to go through your aftercare instructions again.  If you were prescribed any opioid pain medication today such as Norco, Vicodin, Percocet, morphine, hydrocodone, or oxycodone please make sure you do not drive when you are taking this medication as it can alter your ability to drive safely.  If you have any concerns once you are home that you are not improving or are in fact getting worse before you can make it to your follow-up appointment, please do not hesitate to call 911 and come back for further evaluation.  Darel Hong, MD  Results for orders placed or performed during the hospital encounter of 12/18/16  Comprehensive metabolic panel  Result Value Ref Range   Sodium 142 135 - 145 mmol/L   Potassium 4.0 3.5 - 5.1 mmol/L   Chloride 105 101 - 111 mmol/L   CO2 27 22 - 32 mmol/L   Glucose, Bld 104 (H) 65 - 99 mg/dL   BUN 20 6 - 20 mg/dL   Creatinine, Ser 0.68 0.44 - 1.00 mg/dL   Calcium 9.5 8.9 - 10.3 mg/dL   Total Protein 7.6 6.5 - 8.1 g/dL   Albumin 4.0 3.5 - 5.0 g/dL   AST 22 15 - 41 U/L   ALT 17 14 - 54 U/L   Alkaline Phosphatase 80 38 - 126 U/L   Total Bilirubin 0.8 0.3 - 1.2 mg/dL   GFR calc non Af Amer >60 >60 mL/min   GFR calc Af Amer >60 >60 mL/min   Anion gap 10 5 - 15  CBC with Differential  Result Value Ref Range   WBC 9.5 3.6 - 11.0 K/uL   RBC 4.43 3.80 - 5.20 MIL/uL   Hemoglobin 14.0 12.0 - 16.0 g/dL   HCT 41.4 35.0 - 47.0 %   MCV 93.6 80.0 - 100.0 fL   MCH 31.6 26.0 - 34.0 pg   MCHC 33.8 32.0 - 36.0 g/dL   RDW 13.6 11.5 - 14.5 %   Platelets 261 150 - 440 K/uL   Neutrophils Relative % 65 %   Neutro Abs 6.2 1.4 - 6.5 K/uL   Lymphocytes Relative 26 %   Lymphs Abs 2.5 1.0 - 3.6 K/uL   Monocytes Relative 7 %   Monocytes Absolute 0.7 0.2 - 0.9 K/uL   Eosinophils Relative 1 %   Eosinophils Absolute 0.1 0 - 0.7 K/uL   Basophils Relative 1 %   Basophils Absolute 0.1 0 - 0.1 K/uL  Urinalysis, Complete w Microscopic  Result Value Ref Range   Color, Urine YELLOW (A) YELLOW   APPearance CLOUDY (A) CLEAR   Specific Gravity, Urine 1.017 1.005 - 1.030   pH 6.0 5.0 - 8.0   Glucose, UA NEGATIVE NEGATIVE mg/dL   Hgb urine dipstick NEGATIVE NEGATIVE   Bilirubin Urine NEGATIVE NEGATIVE   Ketones, ur 20 (A) NEGATIVE mg/dL   Protein, ur 30 (A) NEGATIVE mg/dL   Nitrite NEGATIVE NEGATIVE   Leukocytes, UA LARGE (A) NEGATIVE   RBC / HPF  6-30 0 - 5 RBC/hpf   WBC, UA TOO NUMEROUS TO COUNT 0 - 5 WBC/hpf   Bacteria, UA NONE SEEN NONE SEEN   Squamous Epithelial / LPF 6-30 (A) NONE SEEN   Mucus PRESENT    Dg Hip Unilat W Or Wo Pelvis 2-3 Views Left  Result Date: 12/05/2016 CLINICAL DATA:  Left hip pain due to fall yesterday. EXAM: DG HIP (WITH OR WITHOUT PELVIS) 2-3V LEFT COMPARISON:  None. FINDINGS: Examination demonstrates no evidence of fracture or dislocation. Subtle degenerative change of the hips. Mild degenerate change of the spine. IMPRESSION: No acute findings. Electronically Signed   By: Marin Olp M.D.   On: 12/05/2016 11:51

## 2016-12-23 ENCOUNTER — Ambulatory Visit (INDEPENDENT_AMBULATORY_CARE_PROVIDER_SITE_OTHER): Payer: Medicare HMO | Admitting: Family Medicine

## 2016-12-23 ENCOUNTER — Encounter: Payer: Self-pay | Admitting: Family Medicine

## 2016-12-23 VITALS — BP 120/70 | HR 100 | Temp 98.0°F | Resp 18 | Ht 65.0 in | Wt 108.4 lb

## 2016-12-23 DIAGNOSIS — J441 Chronic obstructive pulmonary disease with (acute) exacerbation: Secondary | ICD-10-CM | POA: Diagnosis not present

## 2016-12-23 DIAGNOSIS — H6982 Other specified disorders of Eustachian tube, left ear: Secondary | ICD-10-CM | POA: Diagnosis not present

## 2016-12-23 MED ORDER — FLUTICASONE PROPIONATE 50 MCG/ACT NA SUSP: 2.0000 | Freq: Every day | NASAL | 2 refills | Status: DC

## 2016-12-23 MED ORDER — PREDNISONE 20 MG PO TABS: 20.0000 mg | ORAL_TABLET | Freq: Two times a day (BID) | ORAL | 0 refills | Status: AC

## 2016-12-23 MED ORDER — IPRATROPIUM-ALBUTEROL 20-100 MCG/ACT IN AERS: 1.0000 | INHALATION_SPRAY | Freq: Four times a day (QID) | RESPIRATORY_TRACT | 0 refills | Status: DC

## 2016-12-23 NOTE — Patient Instructions (Addendum)
Please get Breo Refill at your pharmacy. Use rescue inhaler (ipratropium/albuterol) as needed. Chronic Obstructive Pulmonary Disease Exacerbation Chronic obstructive pulmonary disease (COPD) is a common lung problem. In COPD, the flow of air from the lungs is limited. COPD exacerbations are times that breathing gets worse and you need extra treatment. Without treatment they can be life threatening. If they happen often, your lungs can become more damaged. If your COPD gets worse, your doctor may treat you with:  Medicines.  Oxygen.  Different ways to clear your airway, such as using a mask.  Follow these instructions at home:  Do not smoke.  Avoid tobacco smoke and other things that bother your lungs.  If given, take your antibiotic medicine as told. Finish the medicine even if you start to feel better.  Only take medicines as told by your doctor.  Drink enough fluids to keep your pee (urine) clear or pale yellow (unless your doctor has told you not to).  Use a cool mist machine (vaporizer).  If you use oxygen or a machine that turns liquid medicine into a mist (nebulizer), continue to use them as told.  Keep up with shots (vaccinations) as told by your doctor.  Exercise regularly.  Eat healthy foods.  Keep all doctor visits as told. Get help right away if:  You are very short of breath and it gets worse.  You have trouble talking.  You have bad chest pain.  You have blood in your spit (sputum).  You have a fever.  You keep throwing up (vomiting).  You feel weak, or you pass out (faint).  You feel confused.  You keep getting worse. This information is not intended to replace advice given to you by your health care provider. Make sure you discuss any questions you have with your health care provider. Document Released: 01/03/2011 Document Revised: 06/22/2015 Document Reviewed: 09/18/2012 Elsevier Interactive Patient Education  2017 [employer].  Eustachian  Tube Dysfunction The eustachian tube connects the middle ear to the back of the nose. It regulates air pressure in the middle ear by allowing air to move between the ear and nose. It also helps to drain fluid from the middle ear space. When the eustachian tube does not function properly, air pressure, fluid, or both can build up in the middle ear. Eustachian tube dysfunction can affect one or both ears. What are the causes? This condition happens when the eustachian tube becomes blocked or cannot open normally. This may result from:  Ear infections.  Colds and other upper respiratory infections.  Allergies.  Irritation, such as from cigarette smoke or acid from the stomach coming up into the esophagus (gastroesophageal reflux).  Sudden changes in air pressure, such as from descending in an airplane.  Abnormal growths in the nose or throat, such as nasal polyps, tumors, or enlarged tissue at the back of the throat (adenoids).  What increases the risk? This condition may be more likely to develop in people who smoke and people who are overweight. Eustachian tube dysfunction may also be more likely to develop in children, especially children who have:  Certain birth defects of the mouth, such as cleft palate.  Large tonsils and adenoids.  What are the signs or symptoms? Symptoms of this condition may include:  A feeling of fullness in the ear.  Ear pain.  Clicking or popping noises in the ear.  Ringing in the ear.  Hearing loss.  Loss of balance.  Symptoms may get worse when the air  pressure around you changes, such as when you travel to an area of high elevation or fly on an airplane. How is this diagnosed? This condition may be diagnosed based on:  Your symptoms.  A physical exam of your ear, nose, and throat.  Tests, such as those that measure: ? The movement of your eardrum (tympanogram). ? Your hearing (audiometry).  How is this treated? Treatment depends on the  cause and severity of your condition. If your symptoms are mild, you may be able to relieve your symptoms by moving air into ("popping") your ears. If you have symptoms of fluid in your ears, treatment may include:  Decongestants.  Antihistamines.  Nasal sprays or ear drops that contain medicines that reduce swelling (steroids).  In some cases, you may need to have a procedure to drain the fluid in your eardrum (myringotomy). In this procedure, a small tube is placed in the eardrum to:  Drain the fluid.  Restore the air in the middle ear space.  Follow these instructions at home:  Take over-the-counter and prescription medicines only as told by your health care provider.  Use techniques to help pop your ears as recommended by your health care provider. These may include: ? Chewing gum. ? Yawning. ? Frequent, forceful swallowing. ? Closing your mouth, holding your nose closed, and gently blowing as if you are trying to blow air out of your nose.  Do not do any of the following until your health care provider approves: ? Travel to high altitudes. ? Fly in airplanes. ? Work in a P[employer] scheme manager or room. ? Scuba dive.  Keep your ears dry. Dry your ears completely after showering or bathing.  Do not smoke.  Keep all follow-up visits as told by your health care provider. This is important. Contact a health care provider if:  Your symptoms do not go away after treatment.  Your symptoms come back after treatment.  You are unable to pop your ears.  You have: ? A fever. ? Pain in your ear. ? Pain in your head or neck. ? Fluid draining from your ear.  Your hearing suddenly changes.  You become very dizzy.  You lose your balance. This information is not intended to replace advice given to you by your health care provider. Make sure you discuss any questions you have with your health care provider. Document Released: 02/10/2015 Document Revised: 06/22/2015 Document  Reviewed: 02/02/2014 Elsevier Interactive Patient Education  [employer].

## 2016-12-23 NOTE — Progress Notes (Signed)
Name: Beth [name]   MRN: [id number]    DOB: 21-Apr-1945   Date:12/23/2016       Progress Note  Subjective  Chief Complaint  Chief Complaint  Patient presents with  . Otalgia    runny nose, congested pain from left ear to left side of throat since yesterday    HPI  Patient presents with complaint of LEFT otalgia, rhinorrhea, occasional cough, endorses shortness of breath that is slightly worse than her baseline (has COPD) x1 day.  Denies chest pain, body aches, fevers or chills, NVD/abdominal pain.  She is taking Vistaril. Has not been using flonase.  She is taking Keflex for UTI currently. She has not been using Breo Inhaler because she was in the hospital and lost it - we will provide sample today and she will request refill from the pharmacy.  She is also out of her Combivent as she has been needing this 2-3 times a day.  Patient Active Problem List   Diagnosis Date Noted  . Urinary tract infection 12/05/2016  . Anorexia 12/05/2016  . Protein-calorie malnutrition, mild ([place]) 08/07/2016  . Generalized anxiety disorder 07/15/2016  . Chronic neck and back pain 07/15/2016  . Severe episode of recurrent major depressive disorder, without psychotic features ([place]) 07/14/2016  . Moderate benzodiazepine use disorder ([place]) 05/08/2016  . Major depressive disorder, recurrent, severe w/o psychotic behavior ([place]) 05/01/2016  . Seasonal allergic rhinitis 03/23/2015  . Hyperglycemia 03/23/2015  . Senile purpura ([place]) 03/23/2015  . Perennial allergic rhinitis with seasonal variation 03/23/2015  . Marital problems 10/31/2014  . Migraine without aura and without status migrainosus, not intractable 10/31/2014  . Chronic LBP 08/09/2014  . Colon polyp 08/09/2014  . COPD, severe ([place]) 08/09/2014  . CVA, old, hemiparesis ([place]) 08/09/2014  . Dyslipidemia 08/09/2014  . Dysfunction of eustachian tube 08/09/2014  . Fibromyalgia syndrome 08/09/2014  . Gastro-esophageal reflux disease without  esophagitis 08/09/2014  . Benign migrating glossitis 08/09/2014  . Cerebrovascular accident, old 08/09/2014  . IBS (irritable bowel syndrome) 08/09/2014  . Low back pain with radiation 08/09/2014  . Chronic recurrent major depressive disorder ([place]) 08/09/2014  . Dysmetabolic syndrome 73/53/2992  . OP (osteoporosis) 08/09/2014  . Vitamin D deficiency 08/09/2014  . Benign hypertension 07/19/2013  . Benign neoplasm of skin of trunk 06/03/2013  . H/O: pneumonia 09/25/2012    Social History   Tobacco Use  . Smoking status: Current Some Day Smoker    Packs/day: 1.00    Years: 35.00    Pack years: 35.00    Types: Cigarettes    Last attempt to quit: 01/29/2012    Years since quitting: 4.9  . Smokeless tobacco: Never Used  . Tobacco comment: Restarted at 1/2 a pack a day.   Substance Use Topics  . Alcohol use: No    Alcohol/week: 0.0 oz     Current Outpatient Medications:  .  acetaminophen (TYLENOL) 325 MG tablet, Take 650 mg by mouth every 4 (four) hours as needed., Disp: , Rfl:  .  aspirin 81 MG chewable tablet, Chew 81 mg daily by mouth., Disp: , Rfl:  .  atorvastatin (LIPITOR) 40 MG tablet, TAKE 1 TABLET(40 MG) BY MOUTH DAILY, Disp: 90 tablet, Rfl: 1 .  azelastine (ASTELIN) 0.1 % nasal spray, Place 1 spray into both nostrils 2 (two) times daily. Use in each nostril as directed, Disp: 30 mL, Rfl: 2 .  cephALEXin (KEFLEX) 500 MG capsule, Take 1 capsule (500 mg total) by mouth 3 (three) times daily for  5 days., Disp: 15 capsule, Rfl: 0 .  fluticasone (FLONASE) 50 MCG/ACT nasal spray, Place 2 sprays into both nostrils daily., Disp: 16 g, Rfl: 2 .  fluticasone furoate-vilanterol (BREO ELLIPTA) 100-25 MCG/INH AEPB, Inhale 1 puff into the lungs daily., Disp: 60 each, Rfl: 5 .  gabapentin (NEURONTIN) 300 MG capsule, Take 1 capsule (300 mg total) by mouth 2 (two) times daily., Disp: 60 capsule, Rfl: 0 .  hydrOXYzine (VISTARIL) 50 MG capsule, Take 50 mg by mouth every 6 (six) hours as  needed., Disp: , Rfl:  .  Ipratropium-Albuterol (COMBIVENT RESPIMAT) 20-100 MCG/ACT AERS respimat, Inhale 1 puff into the lungs every 6 (six) hours., Disp: 1 Inhaler, Rfl: 0 .  lidocaine (XYLOCAINE) 5 % ointment, Apply 2-3 grams topically to affected area 3-4 times per day., Disp: 1063.2 g, Rfl: 5 .  metoprolol succinate (TOPROL-XL) 25 MG 24 hr tablet, Take 1 tablet (25 mg total) by mouth daily., Disp: 90 tablet, Rfl: 1 .  mirtazapine (REMERON) 30 MG tablet, Take 1 tablet daily by mouth., Disp: , Rfl:  .  Multiple Vitamin (THERA) TABS, Take by mouth., Disp: , Rfl:  .  nicotine (NICODERM CQ - DOSED IN MG/24 HOURS) 21 mg/24hr patch, Place 21 mg onto the skin daily., Disp: , Rfl:  .  omeprazole (PRILOSEC) 40 MG capsule, TAKE 1 CAPSULE BY MOUTH ONCE DAILY, Disp: 30 capsule, Rfl: 1 .  venlafaxine (EFFEXOR) 37.5 MG tablet, Take 37.5 mg by mouth 2 (two) times daily., Disp: , Rfl:  .  predniSONE (DELTASONE) 20 MG tablet, Take 1 tablet (20 mg total) by mouth 2 (two) times daily with a meal for 5 days. Last dose before 2pm., Disp: 10 tablet, Rfl: 0  Allergies  Allergen Reactions  . Bextra  [Valdecoxib]   . Compazine  [Prochlorperazine Edisylate]   . Lithium Carbonate   . Lyrica [Pregabalin]     ROS  Ten systems reviewed and is negative except as mentioned in HPI  Objective  Vitals:   12/23/16 1453  BP: 120/70  Pulse: 100  Resp: 18  Temp: 98 F (36.7 C)  TempSrc: Oral  SpO2: 94%  Weight: 108 lb 6.4 oz (49.2 kg)  Height: '5\' 5"'  (1.651 m)   Body mass index is 18.04 kg/m.  Nursing Note and Vital Signs reviewed.  Physical Exam  Constitutional: Patient appears well-developed and well-nourished. Obese No distress.  HEENT: head atraumatic, normocephalic, pupils equal and reactive to light, EOM's intact, TM's without erythema or bulging, no maxillary or frontal sinus pain on palpation, neck supple without lymphadenopathy, oropharynx pink and moist without exudate.  Bilateral nares  inflamed. Cardiovascular: Normal rate, regular rhythm, S1/S2 present.  No murmur or rub heard. No BLE edema. Pulmonary/Chest: Effort mildly increased, mild wheezing in bilateral upper lobes and very slightly diminished throughout. No respiratory distress or retractions. Psychiatric: Patient has a normal mood and affect. behavior is normal. Judgment and thought content normal.  Recent Results (from the past 2160 hour(s))  Basic metabolic panel     Status: Abnormal   Collection Time: 11/15/16  4:32 PM  Result Value Ref Range   Sodium 140 135 - 145 mmol/L   Potassium 3.6 3.5 - 5.1 mmol/L   Chloride 106 101 - 111 mmol/L   CO2 25 22 - 32 mmol/L   Glucose, Bld 85 65 - 99 mg/dL   BUN 15 6 - 20 mg/dL   Creatinine, Ser 1.04 (H) 0.44 - 1.00 mg/dL   Calcium 8.8 (L) 8.9 - 10.3 mg/dL  GFR calc non Af Amer 52 (L) >60 mL/min   GFR calc Af Amer >60 >60 mL/min    Comment: (NOTE) The eGFR has been calculated using the CKD EPI equation. This calculation has not been validated in all clinical situations. eGFR's persistently <60 mL/min signify possible Chronic Kidney Disease.    Anion gap 9 5 - 15  CBC     Status: None   Collection Time: 11/15/16  4:32 PM  Result Value Ref Range   WBC 8.1 3.6 - 11.0 K/uL   RBC 4.16 3.80 - 5.20 MIL/uL   Hemoglobin 13.4 12.0 - 16.0 g/dL   HCT 38.7 35.0 - 47.0 %   MCV 93.0 80.0 - 100.0 fL   MCH 32.1 26.0 - 34.0 pg   MCHC 34.5 32.0 - 36.0 g/dL   RDW 13.9 11.5 - 14.5 %   Platelets 226 150 - 440 K/uL  Urinalysis, Complete w Microscopic     Status: Abnormal   Collection Time: 11/15/16  4:32 PM  Result Value Ref Range   Color, Urine YELLOW (A) YELLOW   APPearance CLOUDY (A) CLEAR   Specific Gravity, Urine 1.017 1.005 - 1.030   pH 5.0 5.0 - 8.0   Glucose, UA NEGATIVE NEGATIVE mg/dL   Hgb urine dipstick NEGATIVE NEGATIVE   Bilirubin Urine NEGATIVE NEGATIVE   Ketones, ur NEGATIVE NEGATIVE mg/dL   Protein, ur NEGATIVE NEGATIVE mg/dL   Nitrite NEGATIVE NEGATIVE    Leukocytes, UA LARGE (A) NEGATIVE   RBC / HPF 6-30 0 - 5 RBC/hpf   WBC, UA TOO NUMEROUS TO COUNT 0 - 5 WBC/hpf   Bacteria, UA RARE (A) NONE SEEN   Squamous Epithelial / LPF 6-30 (A) NONE SEEN   WBC Clumps PRESENT    Mucus PRESENT   Troponin I     Status: None   Collection Time: 11/15/16  4:32 PM  Result Value Ref Range   Troponin I <0.03 <0.03 ng/mL  Hepatic function panel     Status: Abnormal   Collection Time: 11/15/16  4:32 PM  Result Value Ref Range   Total Protein 6.2 (L) 6.5 - 8.1 g/dL   Albumin 3.3 (L) 3.5 - 5.0 g/dL   AST 19 15 - 41 U/L   ALT 14 14 - 54 U/L   Alkaline Phosphatase 74 38 - 126 U/L   Total Bilirubin 0.6 0.3 - 1.2 mg/dL   Bilirubin, Direct 0.1 0.1 - 0.5 mg/dL   Indirect Bilirubin 0.5 0.3 - 0.9 mg/dL  Urine culture     Status: None   Collection Time: 11/15/16  4:32 PM  Result Value Ref Range   Specimen Description URINE, RANDOM    Special Requests NONE    Culture      NO GROWTH Performed at [facility], [address] [place], [place] [address]    Report Status 11/17/2016 FINAL   Basic metabolic panel     Status: Abnormal   Collection Time: 12/05/16 10:51 AM  Result Value Ref Range   Sodium 141 135 - 145 mmol/L   Potassium 4.0 3.5 - 5.1 mmol/L   Chloride 103 101 - 111 mmol/L   CO2 28 22 - 32 mmol/L   Glucose, Bld 113 (H) 65 - 99 mg/dL   BUN 14 6 - 20 mg/dL   Creatinine, Ser 0.93 0.44 - 1.00 mg/dL   Calcium 9.7 8.9 - 10.3 mg/dL   GFR calc non Af Amer 60 (L) >60 mL/min   GFR calc Af Amer >  60 >60 mL/min    Comment: (NOTE) The eGFR has been calculated using the CKD EPI equation. This calculation has not been validated in all clinical situations. eGFR's persistently <60 mL/min signify possible Chronic Kidney Disease.    Anion gap 10 5 - 15  CBC     Status: None   Collection Time: 12/05/16 10:51 AM  Result Value Ref Range   WBC 6.8 3.6 - 11.0 K/uL   RBC 4.78 3.80 - 5.20 MIL/uL   Hemoglobin 15.0 12.0 - 16.0 g/dL   HCT 45.0 35.0 - 47.0 %    MCV 94.1 80.0 - 100.0 fL   MCH 31.3 26.0 - 34.0 pg   MCHC 33.2 32.0 - 36.0 g/dL   RDW 14.0 11.5 - 14.5 %   Platelets 220 150 - 440 K/uL  CK     Status: Abnormal   Collection Time: 12/05/16 10:51 AM  Result Value Ref Range   Total CK 37 (L) 38 - 234 U/L  Urinalysis, Complete w Microscopic     Status: Abnormal   Collection Time: 12/05/16 10:56 AM  Result Value Ref Range   Color, Urine YELLOW (A) YELLOW   APPearance CLEAR (A) CLEAR   Specific Gravity, Urine 1.009 1.005 - 1.030   pH 6.0 5.0 - 8.0   Glucose, UA NEGATIVE NEGATIVE mg/dL   Hgb urine dipstick MODERATE (A) NEGATIVE   Bilirubin Urine NEGATIVE NEGATIVE   Ketones, ur NEGATIVE NEGATIVE mg/dL   Protein, ur NEGATIVE NEGATIVE mg/dL   Nitrite NEGATIVE NEGATIVE   Leukocytes, UA SMALL (A) NEGATIVE   RBC / HPF 0-5 0 - 5 RBC/hpf   WBC, UA 6-30 0 - 5 WBC/hpf   Bacteria, UA RARE (A) NONE SEEN   Squamous Epithelial / LPF 0-5 (A) NONE SEEN   Mucus PRESENT   Urine Culture     Status: Abnormal   Collection Time: 12/05/16 10:56 AM  Result Value Ref Range   Specimen Description URINE, RANDOM    Special Requests NONE    Culture (A)     <10,000 COLONIES/mL INSIGNIFICANT GROWTH Performed at [facility], [address] [place], [place] [address]    Report Status 12/07/2016 FINAL   Urine Drug Screen, Qualitative ([facility] only)     Status: None   Collection Time: 12/05/16 10:56 AM  Result Value Ref Range   Tricyclic, Ur Screen NONE DETECTED NONE DETECTED   Amphetamines, Ur Screen NONE DETECTED NONE DETECTED   MDMA (Ecstasy)Ur Screen NONE DETECTED NONE DETECTED   Cocaine Metabolite,Ur [place] NONE DETECTED NONE DETECTED   Opiate, Ur Screen NONE DETECTED NONE DETECTED   Phencyclidine (PCP) Ur S NONE DETECTED NONE DETECTED   Cannabinoid 50 Ng, Ur [place] NONE DETECTED NONE DETECTED   Barbiturates, Ur Screen NONE DETECTED NONE DETECTED   Benzodiazepine, Ur Scrn NONE DETECTED NONE DETECTED   Methadone Scn, Ur NONE DETECTED NONE DETECTED     Comment: (NOTE) 488  Tricyclics, urine               Cutoff 1000 ng/mL 200  Amphetamines, urine             Cutoff 1000 ng/mL 300  MDMA (Ecstasy), urine           Cutoff 500 ng/mL 400  Cocaine Metabolite, urine       Cutoff 300 ng/mL 500  Opiate, urine                   Cutoff 300 ng/mL 600  Phencyclidine (PCP), urine      Cutoff 25 ng/mL 700  Cannabinoid, urine              Cutoff 50 ng/mL 800  Barbiturates, urine             Cutoff 200 ng/mL 900  Benzodiazepine, urine           Cutoff 200 ng/mL 1000 Methadone, urine                Cutoff 300 ng/mL 1100 1200 The urine drug screen provides only a preliminary, unconfirmed 1300 analytical test result and should not be used for non-medical 1400 purposes. Clinical consideration and professional judgment should 1500 be applied to any positive drug screen result due to possible 1600 interfering substances. A more specific alternate chemical method 1700 must be used in order to obtain a confirmed analytical result.  1800 Gas chromato graphy / mass spectrometry (GC/MS) is the preferred 1900 confirmatory method.   Glucose, capillary     Status: None   Collection Time: 12/05/16 11:06 AM  Result Value Ref Range   Glucose-Capillary 99 65 - 99 mg/dL   Comment 1 Notify RN   Comprehensive metabolic panel     Status: Abnormal   Collection Time: 12/09/16  7:19 PM  Result Value Ref Range   Sodium 141 135 - 145 mmol/L   Potassium 3.6 3.5 - 5.1 mmol/L   Chloride 103 101 - 111 mmol/L   CO2 26 22 - 32 mmol/L   Glucose, Bld 98 65 - 99 mg/dL   BUN 12 6 - 20 mg/dL   Creatinine, Ser 1.09 (H) 0.44 - 1.00 mg/dL   Calcium 9.8 8.9 - 10.3 mg/dL   Total Protein 7.5 6.5 - 8.1 g/dL   Albumin 4.1 3.5 - 5.0 g/dL   AST 29 15 - 41 U/L   ALT 13 (L) 14 - 54 U/L   Alkaline Phosphatase 91 38 - 126 U/L   Total Bilirubin 0.7 0.3 - 1.2 mg/dL   GFR calc non Af Amer 49 (L) >60 mL/min   GFR calc Af Amer 57 (L) >60 mL/min    Comment: (NOTE) The eGFR has been  calculated using the CKD EPI equation. This calculation has not been validated in all clinical situations. eGFR's persistently <60 mL/min signify possible Chronic Kidney Disease.    Anion gap 12 5 - 15  Ethanol     Status: None   Collection Time: 12/09/16  7:19 PM  Result Value Ref Range   Alcohol, Ethyl (B) <10 <10 mg/dL    Comment:        LOWEST DETECTABLE LIMIT FOR SERUM ALCOHOL IS 10 mg/dL FOR MEDICAL PURPOSES ONLY   Salicylate level     Status: None   Collection Time: 12/09/16  7:19 PM  Result Value Ref Range   Salicylate Lvl 9.9 2.8 - 30.0 mg/dL  Acetaminophen level     Status: Abnormal   Collection Time: 12/09/16  7:19 PM  Result Value Ref Range   Acetaminophen (Tylenol), Serum <10 (L) 10 - 30 ug/mL    Comment:        THERAPEUTIC CONCENTRATIONS VARY SIGNIFICANTLY. A RANGE OF 10-30 ug/mL MAY BE AN EFFECTIVE CONCENTRATION FOR MANY PATIENTS. HOWEVER, SOME ARE BEST TREATED AT CONCENTRATIONS OUTSIDE THIS RANGE. ACETAMINOPHEN CONCENTRATIONS >150 ug/mL AT 4 HOURS AFTER INGESTION AND >50 ug/mL AT 12 HOURS AFTER INGESTION ARE OFTEN ASSOCIATED WITH TOXIC REACTIONS.   cbc     Status: None  Collection Time: 12/09/16  7:19 PM  Result Value Ref Range   WBC 8.9 3.6 - 11.0 K/uL   RBC 4.70 3.80 - 5.20 MIL/uL   Hemoglobin 14.8 12.0 - 16.0 g/dL   HCT 44.8 35.0 - 47.0 %   MCV 95.3 80.0 - 100.0 fL   MCH 31.5 26.0 - 34.0 pg   MCHC 33.1 32.0 - 36.0 g/dL   RDW 14.2 11.5 - 14.5 %   Platelets 275 150 - 440 K/uL  Urine Drug Screen, Qualitative     Status: Abnormal   Collection Time: 12/10/16  7:40 AM  Result Value Ref Range   Tricyclic, Ur Screen NONE DETECTED NONE DETECTED   Amphetamines, Ur Screen NONE DETECTED NONE DETECTED   MDMA (Ecstasy)Ur Screen NONE DETECTED NONE DETECTED   Cocaine Metabolite,Ur [place] NONE DETECTED NONE DETECTED   Opiate, Ur Screen NONE DETECTED NONE DETECTED   Phencyclidine (PCP) Ur S NONE DETECTED NONE DETECTED   Cannabinoid 50 Ng, Ur [place] NONE DETECTED  NONE DETECTED   Barbiturates, Ur Screen NONE DETECTED NONE DETECTED   Benzodiazepine, Ur Scrn POSITIVE (A) NONE DETECTED   Methadone Scn, Ur NONE DETECTED NONE DETECTED    Comment: (NOTE) 427  Tricyclics, urine               Cutoff 1000 ng/mL 200  Amphetamines, urine             Cutoff 1000 ng/mL 300  MDMA (Ecstasy), urine           Cutoff 500 ng/mL 400  Cocaine Metabolite, urine       Cutoff 300 ng/mL 500  Opiate, urine                   Cutoff 300 ng/mL 600  Phencyclidine (PCP), urine      Cutoff 25 ng/mL 700  Cannabinoid, urine              Cutoff 50 ng/mL 800  Barbiturates, urine             Cutoff 200 ng/mL 900  Benzodiazepine, urine           Cutoff 200 ng/mL 1000 Methadone, urine                Cutoff 300 ng/mL 1100 1200 The urine drug screen provides only a preliminary, unconfirmed 1300 analytical test result and should not be used for non-medical 1400 purposes. Clinical consideration and professional judgment should 1500 be applied to any positive drug screen result due to possible 1600 interfering substances. A more specific alternate chemical method 1700 must be used in order to obtain a confirmed analytical result.  1800 Gas chromato graphy / mass spectrometry (GC/MS) is the preferred 1900 confirmatory method.   Comprehensive metabolic panel     Status: Abnormal   Collection Time: 12/18/16  3:39 PM  Result Value Ref Range   Sodium 142 135 - 145 mmol/L   Potassium 4.0 3.5 - 5.1 mmol/L   Chloride 105 101 - 111 mmol/L   CO2 27 22 - 32 mmol/L   Glucose, Bld 104 (H) 65 - 99 mg/dL   BUN 20 6 - 20 mg/dL   Creatinine, Ser 0.68 0.44 - 1.00 mg/dL   Calcium 9.5 8.9 - 10.3 mg/dL   Total Protein 7.6 6.5 - 8.1 g/dL   Albumin 4.0 3.5 - 5.0 g/dL   AST 22 15 - 41 U/L   ALT 17 14 - 54 U/L   Alkaline Phosphatase 80  38 - 126 U/L   Total Bilirubin 0.8 0.3 - 1.2 mg/dL   GFR calc non Af Amer >60 >60 mL/min   GFR calc Af Amer >60 >60 mL/min    Comment: (NOTE) The eGFR has been  calculated using the CKD EPI equation. This calculation has not been validated in all clinical situations. eGFR's persistently <60 mL/min signify possible Chronic Kidney Disease.    Anion gap 10 5 - 15  CBC with Differential     Status: None   Collection Time: 12/18/16  3:39 PM  Result Value Ref Range   WBC 9.5 3.6 - 11.0 K/uL   RBC 4.43 3.80 - 5.20 MIL/uL   Hemoglobin 14.0 12.0 - 16.0 g/dL   HCT 41.4 35.0 - 47.0 %   MCV 93.6 80.0 - 100.0 fL   MCH 31.6 26.0 - 34.0 pg   MCHC 33.8 32.0 - 36.0 g/dL   RDW 13.6 11.5 - 14.5 %   Platelets 261 150 - 440 K/uL   Neutrophils Relative % 65 %   Neutro Abs 6.2 1.4 - 6.5 K/uL   Lymphocytes Relative 26 %   Lymphs Abs 2.5 1.0 - 3.6 K/uL   Monocytes Relative 7 %   Monocytes Absolute 0.7 0.2 - 0.9 K/uL   Eosinophils Relative 1 %   Eosinophils Absolute 0.1 0 - 0.7 K/uL   Basophils Relative 1 %   Basophils Absolute 0.1 0 - 0.1 K/uL  Urinalysis, Complete w Microscopic     Status: Abnormal   Collection Time: 12/18/16  3:39 PM  Result Value Ref Range   Color, Urine YELLOW (A) YELLOW   APPearance CLOUDY (A) CLEAR   Specific Gravity, Urine 1.017 1.005 - 1.030   pH 6.0 5.0 - 8.0   Glucose, UA NEGATIVE NEGATIVE mg/dL   Hgb urine dipstick NEGATIVE NEGATIVE   Bilirubin Urine NEGATIVE NEGATIVE   Ketones, ur 20 (A) NEGATIVE mg/dL   Protein, ur 30 (A) NEGATIVE mg/dL   Nitrite NEGATIVE NEGATIVE   Leukocytes, UA LARGE (A) NEGATIVE   RBC / HPF 6-30 0 - 5 RBC/hpf   WBC, UA TOO NUMEROUS TO COUNT 0 - 5 WBC/hpf   Bacteria, UA NONE SEEN NONE SEEN   Squamous Epithelial / LPF 6-30 (A) NONE SEEN   Mucus PRESENT      Assessment & Plan  1. COPD with acute exacerbation ([place]) - Ipratropium-Albuterol (COMBIVENT RESPIMAT) 20-100 MCG/ACT AERS respimat; Inhale 1 puff into the lungs every 6 (six) hours.  Dispense: 1 Inhaler; Refill: 0 - predniSONE (DELTASONE) 20 MG tablet; Take 1 tablet (20 mg total) by mouth 2 (two) times daily with a meal for 5 days. Last dose  before 2pm.  Dispense: 10 tablet; Refill: 0 - Advised because she is afebrile, symptoms are mild, and O2 Sats are stable, we will hold on antibiotic therapy for the time being.  2. Dysfunction of left eustachian tube - fluticasone (FLONASE) 50 MCG/ACT nasal spray; Place 2 sprays into both nostrils daily.  Dispense: 16 g; Refill: 2  -Red flags and when to present for emergency care or RTC including fever >101.66F, chest pain, shortness of breath unrelieved by inhaler use, new/worsening/un-resolving symptoms, reviewed with patient at time of visit. Follow up and care instructions discussed and provided in AVS.

## 2016-12-26 DIAGNOSIS — R69 Illness, unspecified: Secondary | ICD-10-CM | POA: Diagnosis not present

## 2016-12-30 ENCOUNTER — Encounter: Payer: Self-pay | Admitting: Family Medicine

## 2016-12-30 ENCOUNTER — Ambulatory Visit (INDEPENDENT_AMBULATORY_CARE_PROVIDER_SITE_OTHER): Payer: Medicare HMO | Admitting: Family Medicine

## 2016-12-30 VITALS — BP 120/70 | HR 110 | Resp 14 | Ht 65.0 in | Wt 112.4 lb

## 2016-12-30 DIAGNOSIS — Z23 Encounter for immunization: Secondary | ICD-10-CM

## 2016-12-30 DIAGNOSIS — R69 Illness, unspecified: Secondary | ICD-10-CM | POA: Diagnosis not present

## 2016-12-30 DIAGNOSIS — E441 Mild protein-calorie malnutrition: Secondary | ICD-10-CM

## 2016-12-30 DIAGNOSIS — M79604 Pain in right leg: Secondary | ICD-10-CM | POA: Diagnosis not present

## 2016-12-30 DIAGNOSIS — F339 Major depressive disorder, recurrent, unspecified: Secondary | ICD-10-CM

## 2016-12-30 MED ORDER — NAPROXEN SODIUM 550 MG PO TABS: 550.0000 mg | ORAL_TABLET | Freq: Two times a day (BID) | ORAL | 0 refills | Status: DC

## 2016-12-30 NOTE — Progress Notes (Signed)
Name: Beth [name]   MRN: [id number]    DOB: 08-21-1945   Date:12/30/2016       Progress Note  Subjective  Chief Complaint  Chief Complaint  Patient presents with  . Leg Pain    HPI  Right hip pain: she states that for the past 2 days she has noticed pain on right outer hip, described as aching like constant, 8/10, not sure of aggravating or alleviating factors. Pain migrated down to right anterior thigh and is now sharp 8/10, and radiates to right knee, no rashes, or increase in warmth. She states it is causing some antalgic gait, She denies any trauma or recent falls. Denies back pain, bowel or bladder incontinence.   Major Depression, Anxiety: finally got in to see psychiatrist at [facility] last week and was given rx for Lexapro and Abilify but not sure of the dose. She looks better today, no tremors, calm. She states she has not filled prescriptions yet. We will call pharmacy to check the dose of medication.    Malnutrition: she is having problems with dentures, causing pain, but is drinking Ensure plus, appetite is a little better and she has gained a few pounds, continue supplements and try to eat a soft diet and go back to dentist.    Patient Active Problem List   Diagnosis Date Noted  . Urinary tract infection 12/05/2016  . Anorexia 12/05/2016  . Protein-calorie malnutrition, mild ([place]) 08/07/2016  . Generalized anxiety disorder 07/15/2016  . Chronic neck and back pain 07/15/2016  . Severe episode of recurrent major depressive disorder, without psychotic features ([place]) 07/14/2016  . Moderate benzodiazepine use disorder ([place]) 05/08/2016  . Major depressive disorder, recurrent, severe w/o psychotic behavior ([place]) 05/01/2016  . Seasonal allergic rhinitis 03/23/2015  . Hyperglycemia 03/23/2015  . Senile purpura ([place]) 03/23/2015  . Perennial allergic rhinitis with seasonal variation 03/23/2015  . Marital problems 10/31/2014  . Migraine without aura and without status  migrainosus, not intractable 10/31/2014  . Chronic LBP 08/09/2014  . Colon polyp 08/09/2014  . COPD, severe ([place]) 08/09/2014  . CVA, old, hemiparesis ([place]) 08/09/2014  . Dyslipidemia 08/09/2014  . Dysfunction of eustachian tube 08/09/2014  . Fibromyalgia syndrome 08/09/2014  . Gastro-esophageal reflux disease without esophagitis 08/09/2014  . Benign migrating glossitis 08/09/2014  . Cerebrovascular accident, old 08/09/2014  . IBS (irritable bowel syndrome) 08/09/2014  . Low back pain with radiation 08/09/2014  . Chronic recurrent major depressive disorder ([place]) 08/09/2014  . Dysmetabolic syndrome 07/24/9483  . OP (osteoporosis) 08/09/2014  . Vitamin D deficiency 08/09/2014  . Benign hypertension 07/19/2013  . Benign neoplasm of skin of trunk 06/03/2013  . H/O: pneumonia 09/25/2012    Past Surgical History:  Procedure Laterality Date  . ABDOMINAL HYSTERECTOMY    . APPENDECTOMY    . BREAST SURGERY  2011   biopsy  . CERVICAL DISCECTOMY    . CHOLECYSTECTOMY    . SINUSOTOMY      Family History  Problem Relation Age of Onset  . Anxiety disorder Mother   . Depression Mother   . Cancer Father   . Gallbladder disease Father   . Alcohol abuse Father   . Depression Father     Social History   Socioeconomic History  . Marital status: Married    Spouse name: Not on file  . Number of children: Not on file  . Years of education: Not on file  . Highest education level: Not on file  Social Needs  . Financial  resource strain: Not on file  . Food insecurity - worry: Not on file  . Food insecurity - inability: Not on file  . Transportation needs - medical: Not on file  . Transportation needs - non-medical: Not on file  Occupational History  . Not on file  Tobacco Use  . Smoking status: Current Some Day Smoker    Packs/day: 1.00    Years: 35.00    Pack years: 35.00    Types: Cigarettes    Last attempt to quit: 01/29/2012    Years since quitting: 4.9  . Smokeless tobacco:  Never Used  . Tobacco comment: Restarted at 1/2 a pack a day.   Substance and Sexual Activity  . Alcohol use: No    Alcohol/week: 0.0 oz  . Drug use: No  . Sexual activity: No  Other Topics Concern  . Not on file  Social History Narrative  . Not on file     Current Outpatient Medications:  .  acetaminophen (TYLENOL) 325 MG tablet, Take 650 mg by mouth every 4 (four) hours as needed., Disp: , Rfl:  .  ARIPiprazole (ABILIFY) 5 MG tablet, Take 5 mg by mouth daily., Disp: , Rfl:  .  aspirin 81 MG chewable tablet, Chew 81 mg daily by mouth., Disp: , Rfl:  .  atorvastatin (LIPITOR) 40 MG tablet, TAKE 1 TABLET(40 MG) BY MOUTH DAILY, Disp: 90 tablet, Rfl: 1 .  azelastine (ASTELIN) 0.1 % nasal spray, Place 1 spray into both nostrils 2 (two) times daily. Use in each nostril as directed, Disp: 30 mL, Rfl: 2 .  escitalopram (LEXAPRO) 5 MG tablet, Take 5 mg by mouth daily., Disp: , Rfl:  .  fluticasone (FLONASE) 50 MCG/ACT nasal spray, Place 2 sprays into both nostrils daily., Disp: 16 g, Rfl: 2 .  fluticasone furoate-vilanterol (BREO ELLIPTA) 100-25 MCG/INH AEPB, Inhale 1 puff into the lungs daily., Disp: 60 each, Rfl: 5 .  gabapentin (NEURONTIN) 300 MG capsule, Take 1 capsule (300 mg total) by mouth 2 (two) times daily., Disp: 60 capsule, Rfl: 0 .  Ipratropium-Albuterol (COMBIVENT RESPIMAT) 20-100 MCG/ACT AERS respimat, Inhale 1 puff into the lungs every 6 (six) hours., Disp: 1 Inhaler, Rfl: 0 .  lidocaine (XYLOCAINE) 5 % ointment, Apply 2-3 grams topically to affected area 3-4 times per day., Disp: 1063.2 g, Rfl: 5 .  metoprolol succinate (TOPROL-XL) 25 MG 24 hr tablet, Take 1 tablet (25 mg total) by mouth daily., Disp: 90 tablet, Rfl: 1 .  Multiple Vitamin (THERA) TABS, Take by mouth., Disp: , Rfl:  .  naproxen sodium (ANAPROX) 550 MG tablet, Take 1 tablet (550 mg total) by mouth 2 (two) times daily with a meal., Disp: 14 tablet, Rfl: 0 .  nicotine (NICODERM CQ - DOSED IN MG/24 HOURS) 21 mg/24hr  patch, Place 21 mg onto the skin daily., Disp: , Rfl:  .  omeprazole (PRILOSEC) 40 MG capsule, TAKE 1 CAPSULE BY MOUTH ONCE DAILY, Disp: 30 capsule, Rfl: 1  Allergies  Allergen Reactions  . Bextra  [Valdecoxib]   . Compazine  [Prochlorperazine Edisylate]   . Lithium Carbonate   . Lyrica [Pregabalin]      ROS  Ten systems reviewed and is negative except as mentioned in HPI    Objective  Vitals:   12/30/16 1447  BP: 120/70  Pulse: (!) 110  Resp: 14  SpO2: 96%  Weight: 112 lb 6.4 oz (51 kg)  Height: _0  (1.651 m)    Body mass index is 18.7 kg/m.  Physical Exam   Constitutional: Patient appears well-developed and pale and thin. No distress.  HEENT: head atraumatic, normocephalic, pupils equal and reactive to light, neck supple, throat within normal limits Cardiovascular: Normal rate, regular rhythm and normal heart sounds.  No murmur heard. No BLE edema. Pulmonary/Chest: Effort normal and breath sounds normal. No respiratory distress. Abdominal: Soft.  There is no tenderness. Psychiatric: Patient has a normal mood and affect. behavior is normal. Muscular Skeletal: normal hip and knee exam, no rashes, normal rom, no effusion, no pain during palpation of right thigh   Recent Results (from the past 2160 hour(s))  Basic metabolic panel     Status: Abnormal   Collection Time: 11/15/16  4:32 PM  Result Value Ref Range   Sodium 140 135 - 145 mmol/L   Potassium 3.6 3.5 - 5.1 mmol/L   Chloride 106 101 - 111 mmol/L   CO2 25 22 - 32 mmol/L   Glucose, Bld 85 65 - 99 mg/dL   BUN 15 6 - 20 mg/dL   Creatinine, Ser 1.04 (H) 0.44 - 1.00 mg/dL   Calcium 8.8 (L) 8.9 - 10.3 mg/dL   GFR calc non Af Amer 52 (L) >60 mL/min   GFR calc Af Amer >60 >60 mL/min    Comment: (NOTE) The eGFR has been calculated using the CKD EPI equation. This calculation has not been validated in all clinical situations. eGFR's persistently <60 mL/min signify possible Chronic Kidney Disease.    Anion  gap 9 5 - 15  CBC     Status: None   Collection Time: 11/15/16  4:32 PM  Result Value Ref Range   WBC 8.1 3.6 - 11.0 K/uL   RBC 4.16 3.80 - 5.20 MIL/uL   Hemoglobin 13.4 12.0 - 16.0 g/dL   HCT 38.7 35.0 - 47.0 %   MCV 93.0 80.0 - 100.0 fL   MCH 32.1 26.0 - 34.0 pg   MCHC 34.5 32.0 - 36.0 g/dL   RDW 13.9 11.5 - 14.5 %   Platelets 226 150 - 440 K/uL  Urinalysis, Complete w Microscopic     Status: Abnormal   Collection Time: 11/15/16  4:32 PM  Result Value Ref Range   Color, Urine YELLOW (A) YELLOW   APPearance CLOUDY (A) CLEAR   Specific Gravity, Urine 1.017 1.005 - 1.030   pH 5.0 5.0 - 8.0   Glucose, UA NEGATIVE NEGATIVE mg/dL   Hgb urine dipstick NEGATIVE NEGATIVE   Bilirubin Urine NEGATIVE NEGATIVE   Ketones, ur NEGATIVE NEGATIVE mg/dL   Protein, ur NEGATIVE NEGATIVE mg/dL   Nitrite NEGATIVE NEGATIVE   Leukocytes, UA LARGE (A) NEGATIVE   RBC / HPF 6-30 0 - 5 RBC/hpf   WBC, UA TOO NUMEROUS TO COUNT 0 - 5 WBC/hpf   Bacteria, UA RARE (A) NONE SEEN   Squamous Epithelial / LPF 6-30 (A) NONE SEEN   WBC Clumps PRESENT    Mucus PRESENT   Troponin I     Status: None   Collection Time: 11/15/16  4:32 PM  Result Value Ref Range   Troponin I <0.03 <0.03 ng/mL  Hepatic function panel     Status: Abnormal   Collection Time: 11/15/16  4:32 PM  Result Value Ref Range   Total Protein 6.2 (L) 6.5 - 8.1 g/dL   Albumin 3.3 (L) 3.5 - 5.0 g/dL   AST 19 15 - 41 U/L   ALT 14 14 - 54 U/L   Alkaline Phosphatase 74 38 - 126 U/L  Total Bilirubin 0.6 0.3 - 1.2 mg/dL   Bilirubin, Direct 0.1 0.1 - 0.5 mg/dL   Indirect Bilirubin 0.5 0.3 - 0.9 mg/dL  Urine culture     Status: None   Collection Time: 11/15/16  4:32 PM  Result Value Ref Range   Specimen Description URINE, RANDOM    Special Requests NONE    Culture      NO GROWTH Performed at [facility], [place] [address], [place], [place] [address]    Report Status 11/17/2016 FINAL   Basic metabolic panel     Status: Abnormal    Collection Time: 12/05/16 10:51 AM  Result Value Ref Range   Sodium 141 135 - 145 mmol/L   Potassium 4.0 3.5 - 5.1 mmol/L   Chloride 103 101 - 111 mmol/L   CO2 28 22 - 32 mmol/L   Glucose, Bld 113 (H) 65 - 99 mg/dL   BUN 14 6 - 20 mg/dL   Creatinine, Ser 0.93 0.44 - 1.00 mg/dL   Calcium 9.7 8.9 - 10.3 mg/dL   GFR calc non Af Amer 60 (L) >60 mL/min   GFR calc Af Amer >60 >60 mL/min    Comment: (NOTE) The eGFR has been calculated using the CKD EPI equation. This calculation has not been validated in all clinical situations. eGFR's persistently <60 mL/min signify possible Chronic Kidney Disease.    Anion gap 10 5 - 15  CBC     Status: None   Collection Time: 12/05/16 10:51 AM  Result Value Ref Range   WBC 6.8 3.6 - 11.0 K/uL   RBC 4.78 3.80 - 5.20 MIL/uL   Hemoglobin 15.0 12.0 - 16.0 g/dL   HCT 45.0 35.0 - 47.0 %   MCV 94.1 80.0 - 100.0 fL   MCH 31.3 26.0 - 34.0 pg   MCHC 33.2 32.0 - 36.0 g/dL   RDW 14.0 11.5 - 14.5 %   Platelets 220 150 - 440 K/uL  CK     Status: Abnormal   Collection Time: 12/05/16 10:51 AM  Result Value Ref Range   Total CK 37 (L) 38 - 234 U/L  Urinalysis, Complete w Microscopic     Status: Abnormal   Collection Time: 12/05/16 10:56 AM  Result Value Ref Range   Color, Urine YELLOW (A) YELLOW   APPearance CLEAR (A) CLEAR   Specific Gravity, Urine 1.009 1.005 - 1.030   pH 6.0 5.0 - 8.0   Glucose, UA NEGATIVE NEGATIVE mg/dL   Hgb urine dipstick MODERATE (A) NEGATIVE   Bilirubin Urine NEGATIVE NEGATIVE   Ketones, ur NEGATIVE NEGATIVE mg/dL   Protein, ur NEGATIVE NEGATIVE mg/dL   Nitrite NEGATIVE NEGATIVE   Leukocytes, UA SMALL (A) NEGATIVE   RBC / HPF 0-5 0 - 5 RBC/hpf   WBC, UA 6-30 0 - 5 WBC/hpf   Bacteria, UA RARE (A) NONE SEEN   Squamous Epithelial / LPF 0-5 (A) NONE SEEN   Mucus PRESENT   Urine Culture     Status: Abnormal   Collection Time: 12/05/16 10:56 AM  Result Value Ref Range   Specimen Description URINE, RANDOM    Special Requests  NONE    Culture (A)     <10,000 COLONIES/mL INSIGNIFICANT GROWTH Performed at [facility], [address] [place], [place] [address]    Report Status 12/07/2016 FINAL   Urine Drug Screen, Qualitative ([place] only)     Status: None   Collection Time: 12/05/16 10:56 AM  Result Value Ref Range  Tricyclic, Ur Screen NONE DETECTED NONE DETECTED   Amphetamines, Ur Screen NONE DETECTED NONE DETECTED   MDMA (Ecstasy)Ur Screen NONE DETECTED NONE DETECTED   Cocaine Metabolite,Ur [place] NONE DETECTED NONE DETECTED   Opiate, Ur Screen NONE DETECTED NONE DETECTED   Phencyclidine (PCP) Ur S NONE DETECTED NONE DETECTED   Cannabinoid 50 Ng, Ur [place] NONE DETECTED NONE DETECTED   Barbiturates, Ur Screen NONE DETECTED NONE DETECTED   Benzodiazepine, Ur Scrn NONE DETECTED NONE DETECTED   Methadone Scn, Ur NONE DETECTED NONE DETECTED    Comment: (NOTE) 093  Tricyclics, urine               Cutoff 1000 ng/mL 200  Amphetamines, urine             Cutoff 1000 ng/mL 300  MDMA (Ecstasy), urine           Cutoff 500 ng/mL 400  Cocaine Metabolite, urine       Cutoff 300 ng/mL 500  Opiate, urine                   Cutoff 300 ng/mL 600  Phencyclidine (PCP), urine      Cutoff 25 ng/mL 700  Cannabinoid, urine              Cutoff 50 ng/mL 800  Barbiturates, urine             Cutoff 200 ng/mL 900  Benzodiazepine, urine           Cutoff 200 ng/mL 1000 Methadone, urine                Cutoff 300 ng/mL 1100 1200 The urine drug screen provides only a preliminary, unconfirmed 1300 analytical test result and should not be used for non-medical 1400 purposes. Clinical consideration and professional judgment should 1500 be applied to any positive drug screen result due to possible 1600 interfering substances. A more specific alternate chemical method 1700 must be used in order to obtain a confirmed analytical result.  1800 Gas chromato graphy / mass spectrometry (GC/MS) is the preferred 1900 confirmatory method.    Glucose, capillary     Status: None   Collection Time: 12/05/16 11:06 AM  Result Value Ref Range   Glucose-Capillary 99 65 - 99 mg/dL   Comment 1 Notify RN   Comprehensive metabolic panel     Status: Abnormal   Collection Time: 12/09/16  7:19 PM  Result Value Ref Range   Sodium 141 135 - 145 mmol/L   Potassium 3.6 3.5 - 5.1 mmol/L   Chloride 103 101 - 111 mmol/L   CO2 26 22 - 32 mmol/L   Glucose, Bld 98 65 - 99 mg/dL   BUN 12 6 - 20 mg/dL   Creatinine, Ser 1.09 (H) 0.44 - 1.00 mg/dL   Calcium 9.8 8.9 - 10.3 mg/dL   Total Protein 7.5 6.5 - 8.1 g/dL   Albumin 4.1 3.5 - 5.0 g/dL   AST 29 15 - 41 U/L   ALT 13 (L) 14 - 54 U/L   Alkaline Phosphatase 91 38 - 126 U/L   Total Bilirubin 0.7 0.3 - 1.2 mg/dL   GFR calc non Af Amer 49 (L) >60 mL/min   GFR calc Af Amer 57 (L) >60 mL/min    Comment: (NOTE) The eGFR has been calculated using the CKD EPI equation. This calculation has not been validated in all clinical situations. eGFR's persistently <60 mL/min signify possible Chronic Kidney Disease.    Anion  gap 12 5 - 15  Ethanol     Status: None   Collection Time: 12/09/16  7:19 PM  Result Value Ref Range   Alcohol, Ethyl (B) <10 <10 mg/dL    Comment:        LOWEST DETECTABLE LIMIT FOR SERUM ALCOHOL IS 10 mg/dL FOR MEDICAL PURPOSES ONLY   Salicylate level     Status: None   Collection Time: 12/09/16  7:19 PM  Result Value Ref Range   Salicylate Lvl 9.9 2.8 - 30.0 mg/dL  Acetaminophen level     Status: Abnormal   Collection Time: 12/09/16  7:19 PM  Result Value Ref Range   Acetaminophen (Tylenol), Serum <10 (L) 10 - 30 ug/mL    Comment:        THERAPEUTIC CONCENTRATIONS VARY SIGNIFICANTLY. A RANGE OF 10-30 ug/mL MAY BE AN EFFECTIVE CONCENTRATION FOR MANY PATIENTS. HOWEVER, SOME ARE BEST TREATED AT CONCENTRATIONS OUTSIDE THIS RANGE. ACETAMINOPHEN CONCENTRATIONS >150 ug/mL AT 4 HOURS AFTER INGESTION AND >50 ug/mL AT 12 HOURS AFTER INGESTION ARE OFTEN ASSOCIATED WITH  TOXIC REACTIONS.   cbc     Status: None   Collection Time: 12/09/16  7:19 PM  Result Value Ref Range   WBC 8.9 3.6 - 11.0 K/uL   RBC 4.70 3.80 - 5.20 MIL/uL   Hemoglobin 14.8 12.0 - 16.0 g/dL   HCT 44.8 35.0 - 47.0 %   MCV 95.3 80.0 - 100.0 fL   MCH 31.5 26.0 - 34.0 pg   MCHC 33.1 32.0 - 36.0 g/dL   RDW 14.2 11.5 - 14.5 %   Platelets 275 150 - 440 K/uL  Urine Drug Screen, Qualitative     Status: Abnormal   Collection Time: 12/10/16  7:40 AM  Result Value Ref Range   Tricyclic, Ur Screen NONE DETECTED NONE DETECTED   Amphetamines, Ur Screen NONE DETECTED NONE DETECTED   MDMA (Ecstasy)Ur Screen NONE DETECTED NONE DETECTED   Cocaine Metabolite,Ur [place] NONE DETECTED NONE DETECTED   Opiate, Ur Screen NONE DETECTED NONE DETECTED   Phencyclidine (PCP) Ur S NONE DETECTED NONE DETECTED   Cannabinoid 50 Ng, Ur [place] NONE DETECTED NONE DETECTED   Barbiturates, Ur Screen NONE DETECTED NONE DETECTED   Benzodiazepine, Ur Scrn POSITIVE (A) NONE DETECTED   Methadone Scn, Ur NONE DETECTED NONE DETECTED    Comment: (NOTE) 314  Tricyclics, urine               Cutoff 1000 ng/mL 200  Amphetamines, urine             Cutoff 1000 ng/mL 300  MDMA (Ecstasy), urine           Cutoff 500 ng/mL 400  Cocaine Metabolite, urine       Cutoff 300 ng/mL 500  Opiate, urine                   Cutoff 300 ng/mL 600  Phencyclidine (PCP), urine      Cutoff 25 ng/mL 700  Cannabinoid, urine              Cutoff 50 ng/mL 800  Barbiturates, urine             Cutoff 200 ng/mL 900  Benzodiazepine, urine           Cutoff 200 ng/mL 1000 Methadone, urine                Cutoff 300 ng/mL 1100 1200 The urine drug screen provides only a preliminary, unconfirmed 1300  analytical test result and should not be used for non-medical 1400 purposes. Clinical consideration and professional judgment should 1500 be applied to any positive drug screen result due to possible 1600 interfering substances. A more specific alternate chemical  method 1700 must be used in order to obtain a confirmed analytical result.  1800 Gas chromato graphy / mass spectrometry (GC/MS) is the preferred 1900 confirmatory method.   Comprehensive metabolic panel     Status: Abnormal   Collection Time: 12/18/16  3:39 PM  Result Value Ref Range   Sodium 142 135 - 145 mmol/L   Potassium 4.0 3.5 - 5.1 mmol/L   Chloride 105 101 - 111 mmol/L   CO2 27 22 - 32 mmol/L   Glucose, Bld 104 (H) 65 - 99 mg/dL   BUN 20 6 - 20 mg/dL   Creatinine, Ser 0.68 0.44 - 1.00 mg/dL   Calcium 9.5 8.9 - 10.3 mg/dL   Total Protein 7.6 6.5 - 8.1 g/dL   Albumin 4.0 3.5 - 5.0 g/dL   AST 22 15 - 41 U/L   ALT 17 14 - 54 U/L   Alkaline Phosphatase 80 38 - 126 U/L   Total Bilirubin 0.8 0.3 - 1.2 mg/dL   GFR calc non Af Amer >60 >60 mL/min   GFR calc Af Amer >60 >60 mL/min    Comment: (NOTE) The eGFR has been calculated using the CKD EPI equation. This calculation has not been validated in all clinical situations. eGFR's persistently <60 mL/min signify possible Chronic Kidney Disease.    Anion gap 10 5 - 15  CBC with Differential     Status: None   Collection Time: 12/18/16  3:39 PM  Result Value Ref Range   WBC 9.5 3.6 - 11.0 K/uL   RBC 4.43 3.80 - 5.20 MIL/uL   Hemoglobin 14.0 12.0 - 16.0 g/dL   HCT 41.4 35.0 - 47.0 %   MCV 93.6 80.0 - 100.0 fL   MCH 31.6 26.0 - 34.0 pg   MCHC 33.8 32.0 - 36.0 g/dL   RDW 13.6 11.5 - 14.5 %   Platelets 261 150 - 440 K/uL   Neutrophils Relative % 65 %   Neutro Abs 6.2 1.4 - 6.5 K/uL   Lymphocytes Relative 26 %   Lymphs Abs 2.5 1.0 - 3.6 K/uL   Monocytes Relative 7 %   Monocytes Absolute 0.7 0.2 - 0.9 K/uL   Eosinophils Relative 1 %   Eosinophils Absolute 0.1 0 - 0.7 K/uL   Basophils Relative 1 %   Basophils Absolute 0.1 0 - 0.1 K/uL  Urinalysis, Complete w Microscopic     Status: Abnormal   Collection Time: 12/18/16  3:39 PM  Result Value Ref Range   Color, Urine YELLOW (A) YELLOW   APPearance CLOUDY (A) CLEAR    Specific Gravity, Urine 1.017 1.005 - 1.030   pH 6.0 5.0 - 8.0   Glucose, UA NEGATIVE NEGATIVE mg/dL   Hgb urine dipstick NEGATIVE NEGATIVE   Bilirubin Urine NEGATIVE NEGATIVE   Ketones, ur 20 (A) NEGATIVE mg/dL   Protein, ur 30 (A) NEGATIVE mg/dL   Nitrite NEGATIVE NEGATIVE   Leukocytes, UA LARGE (A) NEGATIVE   RBC / HPF 6-30 0 - 5 RBC/hpf   WBC, UA TOO NUMEROUS TO COUNT 0 - 5 WBC/hpf   Bacteria, UA NONE SEEN NONE SEEN   Squamous Epithelial / LPF 6-30 (A) NONE SEEN   Mucus PRESENT      PHQ2/9: Depression screen [facility] 2/9 09/16/2016 06/14/2016  03/21/2016 12/29/2015 04/20/2015  Decreased Interest _0 Down, Depressed, Hopeless _1 PHQ - 2 Score _2 Altered sleeping _3 Tired, decreased energy 1 - _4 Change in appetite _5 Feeling bad or failure about yourself  0 _6 Trouble concentrating _7 Moving slowly or fidgety/restless 0 _8 0  Suicidal thoughts 0 0 0 0 0  PHQ-9 Score _9 Difficult doing work/chores Somewhat difficult Very difficult Very difficult Very difficult -     Fall Risk: Fall Risk  12/30/2016 06/14/2016 03/21/2016 12/29/2015 09/29/2015  Falls in the past year? No Yes Yes No No  Number falls in past yr: - 1 2 or more - -  Injury with Fall? - Yes Yes - -      Functional Status Survey: Is the patient deaf or have difficulty hearing?: No Does the patient have difficulty seeing, even when wearing glasses/contacts?: No Does the patient have difficulty concentrating, remembering, or making decisions?: No Does the patient have difficulty walking or climbing stairs?: No Does the patient have difficulty dressing or bathing?: No Does the patient have difficulty doing errands alone such as visiting a doctor's office or shopping?: No    Assessment & Plan  1. Right leg pain  - naproxen sodium (ANAPROX) 550 MG tablet; Take 1 tablet (550 mg total) by mouth 2 (two) times daily with a meal.  Dispense: 14 tablet;  Refill: 0  2. Needs flu shot  - Flu vaccine HIGH DOSE PF  3. Mild protein-calorie malnutrition ([place])  Continue Ensure and try a soft diet   4. Chronic recurrent major depressive disorder ([place])  Continue follow up with psychiatrist

## 2016-12-31 ENCOUNTER — Ambulatory Visit: Payer: Self-pay | Admitting: *Deleted

## 2016-12-31 ENCOUNTER — Telehealth: Payer: Self-pay | Admitting: Family Medicine

## 2016-12-31 DIAGNOSIS — M791 Myalgia, unspecified site: Secondary | ICD-10-CM | POA: Diagnosis not present

## 2016-12-31 NOTE — Telephone Encounter (Signed)
Patient is calling to report that the pain in her leg is worse. The medication given- Anaprox is not helping. Pain is in the same area but it is worse- patient states it is so bad it takes her breath. Patient is requesting stronger medication. Triaged patient and advised her to go to Urgent care- she does not want to go- advised patient will send message to office.

## 2016-12-31 NOTE — Telephone Encounter (Signed)
  Reason for Disposition . [1] Thigh or calf pain AND [2] only 1 side AND [3] present > 1 hour  Answer Assessment - Initial Assessment Questions 1. ONSET: "When did the pain start?"      2 days 2. LOCATION: "Where is the pain located?"      Upper R leg 3. PAIN: "How bad is the pain?"    (Scale 1-10; or mild, moderate, severe)   -  MILD (1-3): doesn't interfere with normal activities    -  MODERATE (4-7): interferes with normal activities (e.g., work or school) or awakens from sleep, limping    -  SEVERE (8-10): excruciating pain, unable to do any normal activities, unable to walk     10- walking makes it worse 4. WORK OR EXERCISE: "Has there been any recent work or exercise that involved this part of the body?"      no 5. CAUSE: "What do you think is causing the leg pain?"     Patient was seen yesterday for the pain and given an antiinflammatory it is not helping the pain. The pain is worse. 6. OTHER SYMPTOMS: "Do you have any other symptoms?" (e.g., chest pain, back pain, breathing difficulty, swelling, rash, fever, numbness, weakness)     No- patient is shaking- patient has anxiety that causes her to shake 7. PREGNANCY: "Is there any chance you are pregnant?" "When was your last menstrual period?"     n/a  Protocols used: LEG PAIN-A-AH

## 2017-01-01 ENCOUNTER — Telehealth: Payer: Self-pay | Admitting: Family Medicine

## 2017-01-01 NOTE — Telephone Encounter (Signed)
Left message for patient to go to [facility] for evaluation for leg pain per Dr. Ancil Boozer request.

## 2017-01-01 NOTE — Telephone Encounter (Signed)
I already replied to this message, not sure what happened. She needs to go to [facility] urgent care

## 2017-01-01 NOTE — Telephone Encounter (Signed)
Copied from [place]. Topic: Quick Communication - See Telephone Encounter >> Jan 01, 2017  9:27 AM Ahmed Prima L wrote: CRM for notification. See Telephone encounter for:  Patient is calling to report that the pain in her leg is worse. The medication given- Anaprox is not helping. Pain is in the same area but it is worse- patient states it is so bad it takes her breath. Pt was advised to go to urgent care yesterday by the [facility] nurse, she did go last night & they gave her a shot of anti-imflammatory. She says the pain is no better. Please advise. Call back @ [phone].  [facility] [address] if something else could be sent in.     01/01/17.

## 2017-01-01 NOTE — Telephone Encounter (Signed)
She needs to go to [facility] urgent care for evaluation, I cannot give her anything stronger for pain

## 2017-01-02 ENCOUNTER — Ambulatory Visit: Payer: Self-pay | Admitting: *Deleted

## 2017-01-02 DIAGNOSIS — M79604 Pain in right leg: Secondary | ICD-10-CM | POA: Diagnosis not present

## 2017-01-02 NOTE — Telephone Encounter (Signed)
Pt states the pain is still in her right leg. She went to the urgent care and received an injection for inflammation. She is requesting pain med for this.  After reading Dr. Ancil Boozer note from yesterday, I told pt to go to the [facility]. Her husband is taking her there this morning.  Reason for Disposition . [1] MODERATE pain (e.g., interferes with normal activities, limping) AND [2] present > 3 days  Answer Assessment - Initial Assessment Questions 1. ONSET: "When did the pain start?"      Couple days ago 2. LOCATION: "Where is the pain located?"      Right leg 3. PAIN: "How bad is the pain?"    (Scale 1-10; or mild, moderate, severe)   -  MILD (1-3): doesn't interfere with normal activities    -  MODERATE (4-7): interferes with normal activities (e.g., work or school) or awakens from sleep, limping    -  SEVERE (8-10): excruciating pain, unable to do any normal activities, unable to walk     7 4. WORK OR EXERCISE: "Has there been any recent work or exercise that involved this part of the body?"      no 5. CAUSE: "What do you think is causing the leg pain?"     Not sure 6. OTHER SYMPTOMS: "Do you have any other symptoms?" (e.g., chest pain, back pain, breathing difficulty, swelling, rash, fever, numbness, weakness)     Numbness in calf 7. PREGNANCY: "Is there any chance you are pregnant?" "When was your last menstrual period?"     no  Protocols used: LEG PAIN-A-AH

## 2017-01-02 NOTE — Telephone Encounter (Signed)
FYI

## 2017-01-02 NOTE — Telephone Encounter (Signed)
She was informed to go to [facility] by T[place], but she went to [place]. This morning she reports that she is in extreme pain and is on her way to [facility] as soon as she gets dressed.

## 2017-01-04 ENCOUNTER — Other Ambulatory Visit: Payer: Self-pay

## 2017-01-04 ENCOUNTER — Emergency Department: Payer: Medicare HMO

## 2017-01-04 ENCOUNTER — Encounter: Payer: Self-pay | Admitting: Emergency Medicine

## 2017-01-04 ENCOUNTER — Emergency Department
Admission: EM | Admit: 2017-01-04 | Discharge: 2017-01-04 | Disposition: A | Discharge status: Home / Self Care | Payer: Medicare HMO | Attending: Emergency Medicine | Admitting: Emergency Medicine

## 2017-01-04 DIAGNOSIS — B029 Zoster without complications: Secondary | ICD-10-CM | POA: Insufficient documentation

## 2017-01-04 DIAGNOSIS — Z79899 Other long term (current) drug therapy: Secondary | ICD-10-CM | POA: Insufficient documentation

## 2017-01-04 DIAGNOSIS — Z7982 Long term (current) use of aspirin: Secondary | ICD-10-CM | POA: Diagnosis not present

## 2017-01-04 DIAGNOSIS — I10 Essential (primary) hypertension: Secondary | ICD-10-CM | POA: Diagnosis not present

## 2017-01-04 DIAGNOSIS — F17210 Nicotine dependence, cigarettes, uncomplicated: Secondary | ICD-10-CM | POA: Insufficient documentation

## 2017-01-04 DIAGNOSIS — J449 Chronic obstructive pulmonary disease, unspecified: Secondary | ICD-10-CM | POA: Diagnosis not present

## 2017-01-04 DIAGNOSIS — M25551 Pain in right hip: Secondary | ICD-10-CM | POA: Diagnosis not present

## 2017-01-04 DIAGNOSIS — J45909 Unspecified asthma, uncomplicated: Secondary | ICD-10-CM | POA: Insufficient documentation

## 2017-01-04 DIAGNOSIS — R69 Illness, unspecified: Secondary | ICD-10-CM | POA: Diagnosis not present

## 2017-01-04 LAB — COMPREHENSIVE METABOLIC PANEL
ALT: 34 U/L (ref 14–54)
AST: 23 U/L (ref 15–41)
Albumin: 3.1 g/dL — ABNORMAL LOW (ref 3.5–5.0)
Alkaline Phosphatase: 80 U/L (ref 38–126)
Anion gap: 9 (ref 5–15)
BILIRUBIN TOTAL: 0.3 mg/dL (ref 0.3–1.2)
BUN: 18 mg/dL (ref 6–20)
CALCIUM: 8.6 mg/dL — AB (ref 8.9–10.3)
CO2: 27 mmol/L (ref 22–32)
CREATININE: 0.79 mg/dL (ref 0.44–1.00)
Chloride: 104 mmol/L (ref 101–111)
Glucose, Bld: 130 mg/dL — ABNORMAL HIGH (ref 65–99)
Potassium: 4.2 mmol/L (ref 3.5–5.1)
Sodium: 140 mmol/L (ref 135–145)
TOTAL PROTEIN: 5.8 g/dL — AB (ref 6.5–8.1)

## 2017-01-04 LAB — CBC WITH DIFFERENTIAL/PLATELET
BASOS ABS: 0 10*3/uL (ref 0–0.1)
Basophils Relative: 0 %
EOS PCT: 0 %
Eosinophils Absolute: 0 10*3/uL (ref 0–0.7)
HEMATOCRIT: 39.2 % (ref 35.0–47.0)
Hemoglobin: 13.3 g/dL (ref 12.0–16.0)
LYMPHS ABS: 0.7 10*3/uL — AB (ref 1.0–3.6)
LYMPHS PCT: 8 %
MCH: 31.6 pg (ref 26.0–34.0)
MCHC: 33.9 g/dL (ref 32.0–36.0)
MCV: 93.4 fL (ref 80.0–100.0)
MONO ABS: 0.3 10*3/uL (ref 0.2–0.9)
Monocytes Relative: 3 %
NEUTROS ABS: 8.8 10*3/uL — AB (ref 1.4–6.5)
Neutrophils Relative %: 89 %
Platelets: 232 10*3/uL (ref 150–440)
RBC: 4.2 MIL/uL (ref 3.80–5.20)
RDW: 14.1 % (ref 11.5–14.5)
WBC: 9.8 10*3/uL (ref 3.6–11.0)

## 2017-01-04 LAB — FIBRIN DERIVATIVES D-DIMER (ARMC ONLY): FIBRIN DERIVATIVES D-DIMER (ARMC): 222.39 ng{FEU}/mL (ref 0.00–499.00)

## 2017-01-04 MED ORDER — OXYCODONE-ACETAMINOPHEN 10-325 MG PO TABS: 1.0000 | ORAL_TABLET | ORAL | 0 refills | Status: DC | PRN

## 2017-01-04 MED ORDER — MORPHINE SULFATE (PF) 2 MG/ML IV SOLN
2.0000 mg | Freq: Once | INTRAVENOUS | Status: AC
  Administered 2017-01-04: 2 mg via INTRAVENOUS
  Filled 2017-01-04: qty 1

## 2017-01-04 MED ORDER — FAMCICLOVIR 500 MG PO TABS: 500.0000 mg | ORAL_TABLET | Freq: Three times a day (TID) | ORAL | 0 refills | Status: DC

## 2017-01-04 NOTE — ED Notes (Signed)
Pt to ed with c/o rash on left leg, diffuse red raised rash that pt reports is painful to touch to lower leg.  Pt also reports rash to hip

## 2017-01-04 NOTE — Discharge Instructions (Signed)
Follow-up with your regular doctor if you are not better in 3-5 days, take the antiviral medication as prescribed, do not take the hydrocodone that you were given the other day, take Percocet 10/325 1 every 6 hours as needed for pain, you may also take ibuprofen as needed for pain, continue all of your other medications, return to the emergency department if your worsening

## 2017-01-04 NOTE — ED Provider Notes (Signed)
[facility] Emergency Department Provider Note  ____________________________________________   First MD Initiated Contact with Patient 01/04/17 1149     (approximate)  I have reviewed the triage vital signs and the nursing notes.   HISTORY  Chief Complaint Hip Pain    HPI Beth [name] is a 71 y.o. complains of right hip and leg pain, states it hurts more to bear weight, states the pain is severe, she was seen at the urgent care and given oxycodone and Sterapred and she has not had any relief with the medication, she now has a rash on the right lower, states she has had her shingles vaccine, she denies fever or chills, she denies chest pain or shortness of breath, she is with requesting pain medication as she states the pain is severe  Past Medical History:  Diagnosis Date  . Allergy   . Anxiety   . Asthma   . COPD (chronic obstructive pulmonary disease) ([place])   . CVA (cerebral infarction)   . Depression   . Fibromyalgia   . Headache   . Hyperlipidemia   . Hypertension   . IBS (irritable bowel syndrome)   . Stroke ([place])   . Vitamin D deficiency     Patient Active Problem List   Diagnosis Date Noted  . Urinary tract infection 12/05/2016  . Anorexia 12/05/2016  . Protein-calorie malnutrition, mild ([place]) 08/07/2016  . Generalized anxiety disorder 07/15/2016  . Chronic neck and back pain 07/15/2016  . Severe episode of recurrent major depressive disorder, without psychotic features ([place]) 07/14/2016  . Moderate benzodiazepine use disorder ([place]) 05/08/2016  . Major depressive disorder, recurrent, severe w/o psychotic behavior ([place]) 05/01/2016  . Seasonal allergic rhinitis 03/23/2015  . Hyperglycemia 03/23/2015  . Senile purpura ([place]) 03/23/2015  . Perennial allergic rhinitis with seasonal variation 03/23/2015  . Marital problems 10/31/2014  . Migraine without aura and without status migrainosus, not intractable 10/31/2014  . Chronic LBP  08/09/2014  . Colon polyp 08/09/2014  . COPD, severe ([place]) 08/09/2014  . CVA, old, hemiparesis ([place]) 08/09/2014  . Dyslipidemia 08/09/2014  . Dysfunction of eustachian tube 08/09/2014  . Fibromyalgia syndrome 08/09/2014  . Gastro-esophageal reflux disease without esophagitis 08/09/2014  . Benign migrating glossitis 08/09/2014  . Cerebrovascular accident, old 08/09/2014  . IBS (irritable bowel syndrome) 08/09/2014  . Low back pain with radiation 08/09/2014  . Chronic recurrent major depressive disorder ([place]) 08/09/2014  . Dysmetabolic syndrome 23/55/7322  . OP (osteoporosis) 08/09/2014  . Vitamin D deficiency 08/09/2014  . Benign hypertension 07/19/2013  . Benign neoplasm of skin of trunk 06/03/2013  . H/O: pneumonia 09/25/2012    Past Surgical History:  Procedure Laterality Date  . ABDOMINAL HYSTERECTOMY    . APPENDECTOMY    . BREAST SURGERY  2011   biopsy  . CERVICAL DISCECTOMY    . CHOLECYSTECTOMY    . SINUSOTOMY      Prior to Admission medications   Medication Sig Start Date End Date Taking? Authorizing Provider  acetaminophen (TYLENOL) 325 MG tablet Take 650 mg by mouth every 4 (four) hours as needed.    [provider]  ARIPiprazole (ABILIFY) 5 MG tablet Take 5 mg by mouth daily.    [provider]  aspirin 81 MG chewable tablet Chew 81 mg daily by mouth.    [provider]  atorvastatin (LIPITOR) 40 MG tablet TAKE 1 TABLET(40 MG) BY MOUTH DAILY 06/14/16   Ancil Boozer, Drue Stager, MD  azelastine (ASTELIN) 0.1 % nasal spray Place  1 spray into both nostrils 2 (two) times daily. Use in each nostril as directed 06/14/16   Steele Sizer, MD  escitalopram (LEXAPRO) 5 MG tablet Take 5 mg by mouth daily.    [provider]  fluticasone (FLONASE) 50 MCG/ACT nasal spray Place 2 sprays into both nostrils daily. 12/23/16   Hubbard Hartshorn, FNP  fluticasone furoate-vilanterol (BREO ELLIPTA) 100-25 MCG/INH AEPB Inhale 1 puff into the lungs daily. 10/04/16    Steele Sizer, MD  gabapentin (NEURONTIN) 300 MG capsule Take 1 capsule (300 mg total) by mouth 2 (two) times daily. 11/27/16   Steele Sizer, MD  Ipratropium-Albuterol (COMBIVENT RESPIMAT) 20-100 MCG/ACT AERS respimat Inhale 1 puff into the lungs every 6 (six) hours. 12/23/16   Hubbard Hartshorn, FNP  lidocaine (XYLOCAINE) 5 % ointment Apply 2-3 grams topically to affected area 3-4 times per day. 10/21/16 03/23/17  Steele Sizer, MD  metoprolol succinate (TOPROL-XL) 25 MG 24 hr tablet Take 1 tablet (25 mg total) by mouth daily. 06/14/16   Steele Sizer, MD  Multiple Vitamin (THERA) TABS Take by mouth. 07/19/16   [provider]  naproxen sodium (ANAPROX) 550 MG tablet Take 1 tablet (550 mg total) by mouth 2 (two) times daily with a meal. 12/30/16   Sowles, Drue Stager, MD  nicotine (NICODERM CQ - DOSED IN MG/24 HOURS) 21 mg/24hr patch Place 21 mg onto the skin daily.    [provider]  omeprazole (PRILOSEC) 40 MG capsule TAKE 1 CAPSULE BY MOUTH ONCE DAILY 09/18/16   Steele Sizer, MD    Allergies Bextra  [valdecoxib]; Compazine  [prochlorperazine edisylate]; Lithium carbonate; and Lyrica [pregabalin]  Family History  Problem Relation Age of Onset  . Anxiety disorder Mother   . Depression Mother   . Cancer Father   . Gallbladder disease Father   . Alcohol abuse Father   . Depression Father     Social History Social History   Tobacco Use  . Smoking status: Current Some Day Smoker    Packs/day: 1.00    Years: 35.00    Pack years: 35.00    Types: Cigarettes    Last attempt to quit: 01/29/2012    Years since quitting: 4.9  . Smokeless tobacco: Never Used  . Tobacco comment: Restarted at 1/2 a pack a day.   Substance Use Topics  . Alcohol use: No    Alcohol/week: 0.0 oz  . Drug use: No    Review of Systems  Constitutional: No fever/chills Eyes: No visual changes. ENT: No sore throat. Respiratory: Denies cough Genitourinary: Negative for  dysuria. Musculoskeletal: Negative for back pain.  Positive for right hip and leg pain Skin: Positive for rash on the lower leg    ____________________________________________   PHYSICAL EXAM:  VITAL SIGNS: ED Triage Vitals  Enc Vitals Group     BP 01/04/17 1052 90/78     Pulse Rate 01/04/17 1052 (!) 105     Resp 01/04/17 1052 18     Temp 01/04/17 1052 99.1 F (37.3 C)     Temp Source 01/04/17 1052 Oral     SpO2 01/04/17 1052 96 %     Weight 01/04/17 1054 112 lb (50.8 kg)     Height 01/04/17 1054 5\' 5"  (1.651 m)     Head Circumference --      Peak Flow --      Pain Score 01/04/17 1052 10     Pain Loc --      Pain Edu? --  Excl. in [place]? --     Constitutional: Alert and oriented. Well appearing and in no acute distress.  Patient is trembling, this is her baseline Eyes: Conjunctivae are normal.  Head: Atraumatic. Nose: No congestion/rhinnorhea. Mouth/Throat: Mucous membranes are moist.   Cardiovascular: Normal rate, regular rhythm.  Heart sounds are normal Respiratory: Normal respiratory effort.  No retractions lungs are clear to auscultation GU: deferred Musculoskeletal: FROM all extremities, warm and well perfused, right hip and upper thigh are tender, there is a red rash on the right lower leg, this area is also tender, neurovascular is intact Neurologic:  Normal speech and language.  Skin:  Skin is warm, dry and intact.  Rash on the right lower leg is red, there are no vesicles noted, the area is in a dermatome. Psychiatric: Mood and affect are normal. Speech and behavior are normal.  ____________________________________________   LABS (all labs ordered are listed, but only abnormal results are displayed)  Labs Reviewed  CBC WITH DIFFERENTIAL/PLATELET  COMPREHENSIVE METABOLIC PANEL  FIBRIN DERIVATIVES D-DIMER ([facility] ONLY)   ____________________________________________   ____________________________________________  RADIOLOGY  X-ray of the right hip is  negative  ____________________________________________   PROCEDURES  Procedure(s) performed: IV ordered, 2 mg morphine IV ordered, labs are ordered      ____________________________________________   INITIAL IMPRESSION / ASSESSMENT AND PLAN / ED COURSE  Pertinent labs & imaging results that were available during my care of the patient were reviewed by me and considered in my medical decision making (see chart for details).  Patient is a 71 year old female complaining of right hip pain, she states the pain is severe, her pain level does not match her physical findings on exam, she does have a rash on the right lower leg that time but it is not vesicular, x-ray and labs are ordered, IV was started, patient is to get 2 mg of morphine, will await all results    ----------------------------------------- 1:46 PM on 01/04/2017 -----------------------------------------  Patient's x-ray and labs are basically normal, d-dimer is negative, she continues to describe the right leg pain as throbbing, due to the rash on the lower leg will diagnosed with shingles, prescription for Famvir 500 mg 3 times daily for 7 days, patient is to stop taking the hydrocodone as it is not controlling her pain, a prescription for Percocet 10/325 #20 with no refills was given, patient is to follow-up with her family doctor or return to the emergency department if she is worsening, she states she understands treatment plan and will comply with discarding the other narcotic medication, she was discharged in stable condition   ____________________________________________   FINAL CLINICAL IMPRESSION(S) / ED DIAGNOSES  Final diagnoses:  None      NEW MEDICATIONS STARTED DURING THIS VISIT:  This SmartLink is deprecated. Use AVSMEDLIST instead to display the medication list for a patient.   Note:  This document was prepared using Dragon voice recognition software and may include unintentional dictation  errors.    Versie Starks, PA 01/04/17 1347    Darel Hong, MD 01/04/17 1350

## 2017-01-04 NOTE — ED Triage Notes (Signed)
R hip pain began 6 days ago. Denies fall or injury. 5 days ago pain began radiating down R leg. No edema noted.

## 2017-01-13 DIAGNOSIS — R69 Illness, unspecified: Secondary | ICD-10-CM | POA: Diagnosis not present

## 2017-01-14 ENCOUNTER — Telehealth: Payer: Self-pay | Admitting: Family Medicine

## 2017-01-14 DIAGNOSIS — R69 Illness, unspecified: Secondary | ICD-10-CM | POA: Diagnosis not present

## 2017-01-14 NOTE — Telephone Encounter (Signed)
Copied from [place] [id number]. Topic: Quick Communication - See Telephone Encounter >> Jan 14, 2017 12:12 PM Burnis Medin, NT wrote: CRM for notification. See Telephone encounter for: Pt called in because she said she have shingles and went to the ER and they gave her  oxyCODONE-acetaminophen (PERCOCET) 10-325 MG tablet. Pt said she is out of medication and is in a lot of pain. Pt uses  [facility] on [address] or can pick medication up. Pt would like a call back.  01/14/17.

## 2017-01-14 NOTE — Telephone Encounter (Signed)
I will not give rx pain medication but I can send lidoderm patch to the pharmacy in place of ointment

## 2017-01-15 ENCOUNTER — Emergency Department: Payer: Medicare HMO

## 2017-01-15 ENCOUNTER — Other Ambulatory Visit: Payer: Self-pay

## 2017-01-15 ENCOUNTER — Emergency Department
Admission: EM | Admit: 2017-01-15 | Discharge: 2017-01-15 | Disposition: A | Discharge status: Home / Self Care | Payer: Medicare HMO | Attending: Emergency Medicine | Admitting: Emergency Medicine

## 2017-01-15 ENCOUNTER — Encounter: Payer: Self-pay | Admitting: *Deleted

## 2017-01-15 DIAGNOSIS — G8929 Other chronic pain: Secondary | ICD-10-CM | POA: Diagnosis not present

## 2017-01-15 DIAGNOSIS — J45909 Unspecified asthma, uncomplicated: Secondary | ICD-10-CM | POA: Diagnosis not present

## 2017-01-15 DIAGNOSIS — I10 Essential (primary) hypertension: Secondary | ICD-10-CM | POA: Insufficient documentation

## 2017-01-15 DIAGNOSIS — Z7982 Long term (current) use of aspirin: Secondary | ICD-10-CM | POA: Insufficient documentation

## 2017-01-15 DIAGNOSIS — R000 Tachycardia, unspecified: Secondary | ICD-10-CM | POA: Diagnosis not present

## 2017-01-15 DIAGNOSIS — F17210 Nicotine dependence, cigarettes, uncomplicated: Secondary | ICD-10-CM | POA: Insufficient documentation

## 2017-01-15 DIAGNOSIS — R69 Illness, unspecified: Secondary | ICD-10-CM | POA: Diagnosis not present

## 2017-01-15 DIAGNOSIS — G629 Polyneuropathy, unspecified: Secondary | ICD-10-CM | POA: Diagnosis not present

## 2017-01-15 DIAGNOSIS — M25551 Pain in right hip: Secondary | ICD-10-CM | POA: Diagnosis not present

## 2017-01-15 DIAGNOSIS — J449 Chronic obstructive pulmonary disease, unspecified: Secondary | ICD-10-CM | POA: Diagnosis not present

## 2017-01-15 DIAGNOSIS — M792 Neuralgia and neuritis, unspecified: Secondary | ICD-10-CM | POA: Diagnosis not present

## 2017-01-15 DIAGNOSIS — R0789 Other chest pain: Secondary | ICD-10-CM | POA: Diagnosis not present

## 2017-01-15 DIAGNOSIS — Z79899 Other long term (current) drug therapy: Secondary | ICD-10-CM | POA: Diagnosis not present

## 2017-01-15 DIAGNOSIS — Z8673 Personal history of transient ischemic attack (TIA), and cerebral infarction without residual deficits: Secondary | ICD-10-CM | POA: Insufficient documentation

## 2017-01-15 HISTORY — DX: Chronic pain syndrome: G89.4

## 2017-01-15 MED ORDER — GABAPENTIN 300 MG PO CAPS
300.0000 mg | ORAL_CAPSULE | ORAL | Status: AC
  Administered 2017-01-15: 300 mg via ORAL
  Filled 2017-01-15: qty 1

## 2017-01-15 MED ORDER — ALBUTEROL SULFATE (2.5 MG/3ML) 0.083% IN NEBU: 5.0000 mg | INHALATION_SOLUTION | Freq: Once | RESPIRATORY_TRACT | Status: DC

## 2017-01-15 MED ORDER — GABAPENTIN 600 MG PO TABS
ORAL_TABLET | ORAL | Status: AC
  Filled 2017-01-15: qty 1

## 2017-01-15 MED ORDER — GABAPENTIN 300 MG PO CAPS: 300.0000 mg | ORAL_CAPSULE | Freq: Three times a day (TID) | ORAL | 2 refills | Status: DC

## 2017-01-15 NOTE — Telephone Encounter (Signed)
Called patient and left vm that Dr. Ancil Boozer would send Lidoderm patch for her shingle pain and not Oxycodone.

## 2017-01-15 NOTE — ED Provider Notes (Signed)
[facility] Emergency Department Provider Note  ____________________________________________   First MD Initiated Contact with Patient 01/15/17 1516     (approximate)  I have reviewed the triage vital signs and the nursing notes.   HISTORY  Chief Complaint Herpes Zoster and Shortness of Breath    HPI Beth [name] is a 71 y.o. female who presents for evaluation of persistent pain in her right hip and right lower extremity with some tingling and numbness.  This pain has been persistent for several weeks.  She was seen in [facility] at the start of the symptoms and was diagnosed with possible herpes zoster.  She was given a one-time prescription for Percocet but it was explained to her that the emergency department cannot manage chronic pain and she was instructed to return to her primary care doctor for additional management.  She reports that the pain is persistent and she has run out of the medication she was given.  She reports that she completed a course of antiviral medication and ran out of Percocet.  She states the pain is severe and movement and light touch make the pain worse, nothing makes it better.  I asked if she has gone back to the primary care doctor and her husband who is clearly frustrated states that the PCP, Dr. Ancil Boozer, we will not write her for any pain medication.  I asked about the pain management clinic that she used to go to and she states that she does not go there anymore that she did not pay the bill so they discharged her from the clinic.  She states that the only thing that is helped in the past was the oral morphine she was prescribed and she would like either morphine or Percocet today.  Of note she states that she has taken nothing today except for her Abilify and Zoloft and denies taking any Klonopin or other benzodiazepines and narcotics.  However she has some slurred speech and appears somewhat somnolent with half-lidded eyes and slow  response time.    Past Medical History:  Diagnosis Date  . Allergy   . Anxiety   . Asthma   . Chronic pain syndrome    discharged from pain clinic, hx of narcotics seeking behavior  . COPD (chronic obstructive pulmonary disease) ([place])   . CVA (cerebral infarction)   . Depression   . Fibromyalgia   . Headache   . Hyperlipidemia   . Hypertension   . IBS (irritable bowel syndrome)   . Stroke ([place])   . Vitamin D deficiency     Patient Active Problem List   Diagnosis Date Noted  . Urinary tract infection 12/05/2016  . Anorexia 12/05/2016  . Protein-calorie malnutrition, mild ([place]) 08/07/2016  . Generalized anxiety disorder 07/15/2016  . Chronic neck and back pain 07/15/2016  . Severe episode of recurrent major depressive disorder, without psychotic features ([place]) 07/14/2016  . Moderate benzodiazepine use disorder ([place]) 05/08/2016  . Major depressive disorder, recurrent, severe w/o psychotic behavior ([place]) 05/01/2016  . Seasonal allergic rhinitis 03/23/2015  . Hyperglycemia 03/23/2015  . Senile purpura ([place]) 03/23/2015  . Perennial allergic rhinitis with seasonal variation 03/23/2015  . Marital problems 10/31/2014  . Migraine without aura and without status migrainosus, not intractable 10/31/2014  . Chronic LBP 08/09/2014  . Colon polyp 08/09/2014  . COPD, severe ([place]) 08/09/2014  . CVA, old, hemiparesis ([place]) 08/09/2014  . Dyslipidemia 08/09/2014  . Dysfunction of eustachian tube 08/09/2014  . Fibromyalgia syndrome 08/09/2014  .  Gastro-esophageal reflux disease without esophagitis 08/09/2014  . Benign migrating glossitis 08/09/2014  . Cerebrovascular accident, old 08/09/2014  . IBS (irritable bowel syndrome) 08/09/2014  . Low back pain with radiation 08/09/2014  . Chronic recurrent major depressive disorder ([place]) 08/09/2014  . Dysmetabolic syndrome 53/66/4403  . OP (osteoporosis) 08/09/2014  . Vitamin D deficiency 08/09/2014  . Benign hypertension 07/19/2013  .  Benign neoplasm of skin of trunk 06/03/2013  . H/O: pneumonia 09/25/2012    Past Surgical History:  Procedure Laterality Date  . ABDOMINAL HYSTERECTOMY    . APPENDECTOMY    . BREAST SURGERY  2011   biopsy  . CERVICAL DISCECTOMY    . CHOLECYSTECTOMY    . SINUSOTOMY      Prior to Admission medications   Medication Sig Start Date End Date Taking? Authorizing Provider  acetaminophen (TYLENOL) 325 MG tablet Take 650 mg by mouth every 4 (four) hours as needed.    [provider]  ARIPiprazole (ABILIFY) 5 MG tablet Take 5 mg by mouth daily.    [provider]  aspirin 81 MG chewable tablet Chew 81 mg daily by mouth.    [provider]  atorvastatin (LIPITOR) 40 MG tablet TAKE 1 TABLET(40 MG) BY MOUTH DAILY 06/14/16   Ancil Boozer, Drue Stager, MD  azelastine (ASTELIN) 0.1 % nasal spray Place 1 spray into both nostrils 2 (two) times daily. Use in each nostril as directed 06/14/16   Steele Sizer, MD  escitalopram (LEXAPRO) 5 MG tablet Take 5 mg by mouth daily.    [provider]  famciclovir (FAMVIR) 500 MG tablet Take 1 tablet (500 mg total) by mouth 3 (three) times daily. 01/04/17   Fisher, Linden Dolin, PA-C  fluticasone (FLONASE) 50 MCG/ACT nasal spray Place 2 sprays into both nostrils daily. 12/23/16   Hubbard Hartshorn, FNP  fluticasone furoate-vilanterol (BREO ELLIPTA) 100-25 MCG/INH AEPB Inhale 1 puff into the lungs daily. 10/04/16   Steele Sizer, MD  gabapentin (NEURONTIN) 300 MG capsule Take 1 capsule (300 mg total) by mouth 3 (three) times daily. 01/15/17 01/15/18  Hinda Kehr, MD  Ipratropium-Albuterol (COMBIVENT RESPIMAT) 20-100 MCG/ACT AERS respimat Inhale 1 puff into the lungs every 6 (six) hours. 12/23/16   Hubbard Hartshorn, FNP  lidocaine (XYLOCAINE) 5 % ointment Apply 2-3 grams topically to affected area 3-4 times per day. 10/21/16 03/23/17  Steele Sizer, MD  metoprolol succinate (TOPROL-XL) 25 MG 24 hr tablet Take 1 tablet (25 mg total) by mouth daily.  06/14/16   Steele Sizer, MD  Multiple Vitamin (THERA) TABS Take by mouth. 07/19/16   [provider]  naproxen sodium (ANAPROX) 550 MG tablet Take 1 tablet (550 mg total) by mouth 2 (two) times daily with a meal. 12/30/16   Sowles, Drue Stager, MD  nicotine (NICODERM CQ - DOSED IN MG/24 HOURS) 21 mg/24hr patch Place 21 mg onto the skin daily.    [provider]  omeprazole (PRILOSEC) 40 MG capsule TAKE 1 CAPSULE BY MOUTH ONCE DAILY 09/18/16   Steele Sizer, MD  oxyCODONE-acetaminophen (PERCOCET) 10-325 MG tablet Take 1 tablet by mouth every 4 (four) hours as needed for pain. 01/04/17   Versie Starks, PA-C    Allergies Bextra  [valdecoxib]; Compazine  [prochlorperazine edisylate]; Lithium carbonate; and Lyrica [pregabalin]  Family History  Problem Relation Age of Onset  . Anxiety disorder Mother   . Depression Mother   . Cancer Father   . Gallbladder disease Father   . Alcohol abuse Father   . Depression Father  Social History Social History   Tobacco Use  . Smoking status: Current Some Day Smoker    Packs/day: 1.00    Years: 35.00    Pack years: 35.00    Types: Cigarettes    Last attempt to quit: 01/29/2012    Years since quitting: 4.9  . Smokeless tobacco: Never Used  . Tobacco comment: Restarted at 1/2 a pack a day.   Substance Use Topics  . Alcohol use: No    Alcohol/week: 0.0 oz  . Drug use: No    Review of Systems Constitutional: No fever/chills Eyes: No visual changes. ENT: No sore throat. Cardiovascular: Denies chest pain. Respiratory: Denies shortness of breath. Gastrointestinal: No abdominal pain.  No nausea, no vomiting.  No diarrhea.  No constipation. Genitourinary: Negative for dysuria. Musculoskeletal: Negative for neck pain.  Negative for back pain. Integumentary: Negative for rash. Neurological: Negative for headaches, focal weakness or numbness.   ____________________________________________   PHYSICAL EXAM:  VITAL SIGNS: ED  Triage Vitals  Enc Vitals Group     BP 01/15/17 1444 (!) 111/94     Pulse Rate 01/15/17 1444 (!) 103     Resp 01/15/17 1444 16     Temp 01/15/17 1444 97.7 F (36.5 C)     Temp Source 01/15/17 1444 Oral     SpO2 01/15/17 1444 97 %     Weight 01/15/17 1445 50.8 kg (112 lb)     Height 01/15/17 1445 1.651 m (5\' 5" )     Head Circumference --      Peak Flow --      Pain Score 01/15/17 1444 10     Pain Loc --      Pain Edu? --      Excl. in [place]? --     Constitutional: Alert but appears somewhat somnolent.  Does not appear to be in acute distress. Eyes: Conjunctivae are normal.  Head: Atraumatic. Nose: No congestion/rhinnorhea. Mouth/Throat: Mucous membranes are moist. Cardiovascular: Normal rate, regular rhythm. Good peripheral circulation. Grossly normal heart sounds. Respiratory: Normal respiratory effort.  No retractions.  Musculoskeletal: No lower extremity tenderness nor edema. No gross deformities of extremities.   Neurologic:  Slightly slurred speech and language, otherwise unremarkable. No gross focal neurologic deficits are appreciated.  Skin:  Skin is warm, dry and intact.  The patient has some excoriated lesions on her right lower extremity which do not appear vesicular, but appeared to be lesions that she has scratched and have bled a little bit.  ____________________________________________   LABS (all labs ordered are listed, but only abnormal results are displayed)  Labs Reviewed - No data to display ____________________________________________  EKG  ED ECG REPORT I, Hinda Kehr, the attending physician, personally viewed and interpreted this ECG.  Date: 01/15/2017 EKG Time: 14:47 Rate: 102 Rhythm: borderline sinus tachycardia QRS Axis: normal Intervals: normal ST/T Wave abnormalities: Non-specific ST segment / T-wave changes, but no evidence of acute ischemia. Narrative Interpretation: no evidence of acute  ischemia   ____________________________________________  RADIOLOGY   Dg Chest 2 View  Result Date: 01/15/2017 CLINICAL DATA:  Chest tightness for the past couple weeks. Shortness of breath. EXAM: CHEST  2 VIEW COMPARISON:  Chest x-ray dated November 15, 2016. FINDINGS: The cardiomediastinal silhouette is normal in size. Normal pulmonary vascularity. Coarsened interstitial lung markings are similar to prior study. The lungs are hyperinflated. No focal consolidation, pleural effusion, or pneumothorax. Unchanged lower thoracic compression deformity. No acute osseous abnormality. IMPRESSION: COPD.  No active cardiopulmonary disease. Electronically Signed  By: Titus Dubin M.D.   On: 01/15/2017 15:30    ____________________________________________   PROCEDURES  Critical Care performed: No   Procedure(s) performed:   Procedures   ____________________________________________   INITIAL IMPRESSION / ASSESSMENT AND PLAN / ED COURSE  As part of my medical decision making, I reviewed the following data within the [place] notes reviewed and incorporated, EKG interpreted , Radiograph reviewed , Discussed with PCP , Notes from prior ED visits and [place] Controlled Substance Database    I reviewed the patient's medical record extensively as well as the drug database and I am concerned about drug-seeking behavior and narcotics dependence.  I discussed this with the patient and her husband and the husband in particular became very agitated and explained that she was on morphine before and she needs to be on morphine again.  I explained that it was against [facility] policy for me to prescribe narcotics for chronic pain but I would be happy to speak with the PCP.  I also spoke with Dr. Ola Spurr with infectious disease to get other recommendations for nonnarcotic treatment of her neuropathic pain that may be a result of the shingles.  He recommended gabapentin which she  was taking previously but she reported to me that she stopped taking because she did not feel it was doing anything.  I wrote her a new prescription.  He also recommended a capsaicin ointment.  I then called and spoke with Dr. Ancil Boozer, her PCP.  Dr. Ancil Boozer is also very concerned about drug-seeking behavior and said that they have tried on multiple occasions to get her to prescribe narcotics.  She provided additional information that the patient was dismissed from her psychiatrist for diverting benzodiazepines from her husband and taking them inappropriately.  She strongly encourage me to avoid writing for any narcotics and I explained her my plan of having patient follow-up with the [facility] chronic pain clinic.  She encourage this as well.  I gave the shunt and her husband my but there is no evidence of acute or emergent medical condition at this time.     ____________________________________________  FINAL CLINICAL IMPRESSION(S) / ED DIAGNOSES  Final diagnoses:  Chronic right hip pain  Neuropathic pain     MEDICATIONS GIVEN DURING THIS VISIT:  Medications  gabapentin (NEURONTIN) capsule 300 mg (300 mg Oral Given 01/15/17 1625)     ED Discharge Orders        Ordered    gabapentin (NEURONTIN) 300 MG capsule  3 times daily     01/15/17 1550       Note:  This document was prepared using Dragon voice recognition software and may include unintentional dictation errors.    Hinda Kehr, MD 01/15/17 [id number]

## 2017-01-15 NOTE — ED Notes (Addendum)
Pt has shingles.  Pt has increased pain in right leg and hip.  Pt out of percocet and antiviral meds for 2 days.  States pain has increased.  No injury to right leg or hip.  Pt with slurred speech.   Family with pt.

## 2017-01-15 NOTE — ED Triage Notes (Signed)
Pt to ED reporting she had been treated for shingles with pain medication and a cream. Pt reports she has run out of medications and the pain has returned. Pain on legs and on head.   Pt reports she has now also started to have SOB and a productive cough with yellow sputum for the past 2 weeks. No fevers reported.

## 2017-01-15 NOTE — Discharge Instructions (Signed)
As we discussed, we cannot provide prescriptions for narcotics for chronic pain.  Our policy is the same as the policy of Dr. Ancil Boozer.  Your chronic pain must be managed by a pain clinic.  I provided the contact information for Dr. Holley Raring at the [facility], and I encourage you to call to schedule an appointment.  Dr. Ancil Boozer help you if a referral is needed, but the emergency department will not provide narcotics.  To help with pain you may be having as a result of a prior shingles infection, please take the gabapentin as prescribed.  You should also take ibuprofen and Tylenol according to label instructions and try using [facility] or another topical ointment that includes capsaicin.

## 2017-01-17 ENCOUNTER — Encounter: Payer: Self-pay | Admitting: Family Medicine

## 2017-01-17 ENCOUNTER — Ambulatory Visit (INDEPENDENT_AMBULATORY_CARE_PROVIDER_SITE_OTHER): Payer: Medicare HMO | Admitting: Family Medicine

## 2017-01-17 VITALS — BP 108/64 | HR 90 | Temp 98.2°F | Resp 18 | Wt 112.2 lb

## 2017-01-17 DIAGNOSIS — M25551 Pain in right hip: Secondary | ICD-10-CM

## 2017-01-17 DIAGNOSIS — G8929 Other chronic pain: Secondary | ICD-10-CM | POA: Diagnosis not present

## 2017-01-17 NOTE — Patient Instructions (Addendum)
Please only take 1 Capsule three times a day of your Gabapentin (Morning, Afternoon, and Nighttime)  Please refill your Lidocaine Ointment at the pharmacy to apply to your Hip as needed for pain.

## 2017-01-17 NOTE — Progress Notes (Signed)
Name: Beth [name]   MRN: [id number]    DOB: 02/24/1945   Date:01/17/2017       Progress Note  Subjective  Chief Complaint  Chief Complaint  Patient presents with  . Referral    pain clinic Dr. [name]   . Follow-up    3 week     HPI Pt presents with her husband for ER follow up:  Pt presents to follow up on multiple ER visits for RIGHT Hip and low back pain.  Was given Percocet Rx for herpes zoster on 01/04/17, requested refill from PCP Dr. Ancil Boozer on 01/14/17, was denied refill, and presented to ER on 01/15/17 complaining of right hip pain and requested additional percocet refill during that visit.  Dr. Karma Greaser, [facility] ER, spoke with PCP Dr. Ancil Boozer during pt's 01/15/2017 visit, and it was mutually agreed to refer pt to pain management.  We will provide referral today.  - She notes that she has not had any improvement in her pain with gabapentin - taking 1-2 capsules at morning, lunch, and evening; then during the night she may take an additional dose of 1-2 capsules.  Discussed proper dosing and reinforced how she should be taking medication per AVS.  She has lidocaine 5% ointment ordered with refills from 10/21/2016 from PCP Dr. Ancil Boozer, but has not been using. - She describes her pain as constant and shooting into the RLE, endorses right low back pain.  She endorses RLE weakness and numbness.  Denies decrease in range of motion.  - RIGHT hip was Xrayed on both 12/05/16 and 01/04/17 and both were negative for abnormalities.    Patient Active Problem List   Diagnosis Date Noted  . Urinary tract infection 12/05/2016  . Anorexia 12/05/2016  . Protein-calorie malnutrition, mild ([place]) 08/07/2016  . Generalized anxiety disorder 07/15/2016  . Chronic neck and back pain 07/15/2016  . Severe episode of recurrent major depressive disorder, without psychotic features ([place]) 07/14/2016  . Moderate benzodiazepine use disorder ([place]) 05/08/2016  . Major depressive disorder, recurrent, severe w/o  psychotic behavior ([place]) 05/01/2016  . Seasonal allergic rhinitis 03/23/2015  . Hyperglycemia 03/23/2015  . Senile purpura ([place]) 03/23/2015  . Perennial allergic rhinitis with seasonal variation 03/23/2015  . Marital problems 10/31/2014  . Migraine without aura and without status migrainosus, not intractable 10/31/2014  . Chronic LBP 08/09/2014  . Colon polyp 08/09/2014  . COPD, severe ([place]) 08/09/2014  . CVA, old, hemiparesis ([place]) 08/09/2014  . Dyslipidemia 08/09/2014  . Dysfunction of eustachian tube 08/09/2014  . Fibromyalgia syndrome 08/09/2014  . Gastro-esophageal reflux disease without esophagitis 08/09/2014  . Benign migrating glossitis 08/09/2014  . Cerebrovascular accident, old 08/09/2014  . IBS (irritable bowel syndrome) 08/09/2014  . Low back pain with radiation 08/09/2014  . Chronic recurrent major depressive disorder ([place]) 08/09/2014  . Dysmetabolic syndrome 70/96/2836  . OP (osteoporosis) 08/09/2014  . Vitamin D deficiency 08/09/2014  . Benign hypertension 07/19/2013  . Benign neoplasm of skin of trunk 06/03/2013  . H/O: pneumonia 09/25/2012    Social History   Tobacco Use  . Smoking status: Current Some Day Smoker    Packs/day: 1.00    Years: 35.00    Pack years: 35.00    Types: Cigarettes  . Smokeless tobacco: Never Used  . Tobacco comment: Restarted at 1/2 a pack a day.   Substance Use Topics  . Alcohol use: No    Alcohol/week: 0.0 oz     Current Outpatient Medications:  .  acetaminophen (TYLENOL) 325  MG tablet, Take 650 mg by mouth every 4 (four) hours as needed., Disp: , Rfl:  .  ARIPiprazole (ABILIFY) 5 MG tablet, Take 5 mg by mouth daily., Disp: , Rfl:  .  aspirin 81 MG chewable tablet, Chew 81 mg daily by mouth., Disp: , Rfl:  .  atorvastatin (LIPITOR) 40 MG tablet, TAKE 1 TABLET(40 MG) BY MOUTH DAILY, Disp: 90 tablet, Rfl: 1 .  azelastine (ASTELIN) 0.1 % nasal spray, Place 1 spray into both nostrils 2 (two) times daily. Use in each nostril as  directed, Disp: 30 mL, Rfl: 2 .  escitalopram (LEXAPRO) 5 MG tablet, Take 5 mg by mouth daily., Disp: , Rfl:  .  famciclovir (FAMVIR) 500 MG tablet, Take 1 tablet (500 mg total) by mouth 3 (three) times daily., Disp: 21 tablet, Rfl: 0 .  fluticasone (FLONASE) 50 MCG/ACT nasal spray, Place 2 sprays into both nostrils daily., Disp: 16 g, Rfl: 2 .  fluticasone furoate-vilanterol (BREO ELLIPTA) 100-25 MCG/INH AEPB, Inhale 1 puff into the lungs daily., Disp: 60 each, Rfl: 5 .  gabapentin (NEURONTIN) 300 MG capsule, Take 1 capsule (300 mg total) by mouth 3 (three) times daily. (Patient taking differently: Take 300 mg by mouth 4 (four) times daily. ), Disp: 90 capsule, Rfl: 2 .  Ipratropium-Albuterol (COMBIVENT RESPIMAT) 20-100 MCG/ACT AERS respimat, Inhale 1 puff into the lungs every 6 (six) hours., Disp: 1 Inhaler, Rfl: 0 .  lidocaine (XYLOCAINE) 5 % ointment, Apply 2-3 grams topically to affected area 3-4 times per day., Disp: 1063.2 g, Rfl: 5 .  metoprolol succinate (TOPROL-XL) 25 MG 24 hr tablet, Take 1 tablet (25 mg total) by mouth daily., Disp: 90 tablet, Rfl: 1 .  Multiple Vitamin (THERA) TABS, Take by mouth., Disp: , Rfl:  .  naproxen sodium (ANAPROX) 550 MG tablet, Take 1 tablet (550 mg total) by mouth 2 (two) times daily with a meal., Disp: 14 tablet, Rfl: 0 .  nicotine (NICODERM CQ - DOSED IN MG/24 HOURS) 21 mg/24hr patch, Place 21 mg onto the skin daily., Disp: , Rfl:  .  omeprazole (PRILOSEC) 40 MG capsule, TAKE 1 CAPSULE BY MOUTH ONCE DAILY, Disp: 30 capsule, Rfl: 1 .  oxyCODONE-acetaminophen (PERCOCET) 10-325 MG tablet, Take 1 tablet by mouth every 4 (four) hours as needed for pain. (Patient not taking: Reported on 01/17/2017), Disp: 30 tablet, Rfl: 0  Allergies  Allergen Reactions  . Bextra  [Valdecoxib]   . Compazine  [Prochlorperazine Edisylate]   . Lithium Carbonate   . Lyrica [Pregabalin]     ROS  Constitutional: Negative for fever or weight change.  Respiratory: Negative for  cough and shortness of breath.   Cardiovascular: Negative for chest pain or palpitations.  Gastrointestinal: Negative for abdominal pain, no bowel changes.  Musculoskeletal: Negative for gait problem or joint swelling. See HPI  Skin: Negative for rash.  Neurological: Negative for dizziness or headache.  No other specific complaints in a complete review of systems (except as listed in HPI above).  Objective  Vitals:   01/17/17 1105  BP: 108/64  Pulse: 90  Resp: 18  Temp: 98.2 F (36.8 C)  TempSrc: Oral  SpO2: 92%  Weight: 112 lb 3.2 oz (50.9 kg)   Body mass index is 18.67 kg/m.  Nursing Note and Vital Signs reviewed.  Physical Exam  Constitutional: Patient appears well-developed and well-nourished. Obese No distress.  HEENT: head atraumatic, normocephalic Cardiovascular: Normal rate, regular rhythm, S1/S2 present.  No murmur or rub heard. No BLE edema. Pulmonary/Chest:  Effort normal and breath sounds clear. No respiratory distress or retractions. Psychiatric: Patient again appears somewhat somnolent with intermittently slowed responses - denies taking any benzos or narcotics today - appears to be consistent with patient's behavior in ER as well. Musculoskeletal: Normal range of motion, no joint effusions. No gross deformities.  Inconsistent reports of tenderness along the RLE on palpation.  Strength intact +4 bilaterally.  Gait appears slightly unsteady but pt ambulated without assistance. Skin: Skin is warm and dry. No rash noted. No erythema. Small number of scabs to RLE - none of which appear consistent with healing herpes zoster exacerbation. Psychiatric: Patient has a normal mood and affect. behavior is normal. Judgment and thought content normal.   Recent Results (from the past 2160 hour(s))  Basic metabolic panel     Status: Abnormal   Collection Time: 11/15/16  4:32 PM  Result Value Ref Range   Sodium 140 135 - 145 mmol/L   Potassium 3.6 3.5 - 5.1 mmol/L   Chloride  106 101 - 111 mmol/L   CO2 25 22 - 32 mmol/L   Glucose, Bld 85 65 - 99 mg/dL   BUN 15 6 - 20 mg/dL   Creatinine, Ser 1.04 (H) 0.44 - 1.00 mg/dL   Calcium 8.8 (L) 8.9 - 10.3 mg/dL   GFR calc non Af Amer 52 (L) >60 mL/min   GFR calc Af Amer >60 >60 mL/min    Comment: (NOTE) The eGFR has been calculated using the CKD EPI equation. This calculation has not been validated in all clinical situations. eGFR's persistently <60 mL/min signify possible Chronic Kidney Disease.    Anion gap 9 5 - 15  CBC     Status: None   Collection Time: 11/15/16  4:32 PM  Result Value Ref Range   WBC 8.1 3.6 - 11.0 K/uL   RBC 4.16 3.80 - 5.20 MIL/uL   Hemoglobin 13.4 12.0 - 16.0 g/dL   HCT 38.7 35.0 - 47.0 %   MCV 93.0 80.0 - 100.0 fL   MCH 32.1 26.0 - 34.0 pg   MCHC 34.5 32.0 - 36.0 g/dL   RDW 13.9 11.5 - 14.5 %   Platelets 226 150 - 440 K/uL  Urinalysis, Complete w Microscopic     Status: Abnormal   Collection Time: 11/15/16  4:32 PM  Result Value Ref Range   Color, Urine YELLOW (A) YELLOW   APPearance CLOUDY (A) CLEAR   Specific Gravity, Urine 1.017 1.005 - 1.030   pH 5.0 5.0 - 8.0   Glucose, UA NEGATIVE NEGATIVE mg/dL   Hgb urine dipstick NEGATIVE NEGATIVE   Bilirubin Urine NEGATIVE NEGATIVE   Ketones, ur NEGATIVE NEGATIVE mg/dL   Protein, ur NEGATIVE NEGATIVE mg/dL   Nitrite NEGATIVE NEGATIVE   Leukocytes, UA LARGE (A) NEGATIVE   RBC / HPF 6-30 0 - 5 RBC/hpf   WBC, UA TOO NUMEROUS TO COUNT 0 - 5 WBC/hpf   Bacteria, UA RARE (A) NONE SEEN   Squamous Epithelial / LPF 6-30 (A) NONE SEEN   WBC Clumps PRESENT    Mucus PRESENT   Troponin I     Status: None   Collection Time: 11/15/16  4:32 PM  Result Value Ref Range   Troponin I <0.03 <0.03 ng/mL  Hepatic function panel     Status: Abnormal   Collection Time: 11/15/16  4:32 PM  Result Value Ref Range   Total Protein 6.2 (L) 6.5 - 8.1 g/dL   Albumin 3.3 (L) 3.5 - 5.0 g/dL  AST 19 15 - 41 U/L   ALT 14 14 - 54 U/L   Alkaline Phosphatase 74  38 - 126 U/L   Total Bilirubin 0.6 0.3 - 1.2 mg/dL   Bilirubin, Direct 0.1 0.1 - 0.5 mg/dL   Indirect Bilirubin 0.5 0.3 - 0.9 mg/dL  Urine culture     Status: None   Collection Time: 11/15/16  4:32 PM  Result Value Ref Range   Specimen Description URINE, RANDOM    Special Requests NONE    Culture      NO GROWTH Performed at [facility], [place] [address] [place], [place] [address]    Report Status 11/17/2016 FINAL   Basic metabolic panel     Status: Abnormal   Collection Time: 12/05/16 10:51 AM  Result Value Ref Range   Sodium 141 135 - 145 mmol/L   Potassium 4.0 3.5 - 5.1 mmol/L   Chloride 103 101 - 111 mmol/L   CO2 28 22 - 32 mmol/L   Glucose, Bld 113 (H) 65 - 99 mg/dL   BUN 14 6 - 20 mg/dL   Creatinine, Ser 0.93 0.44 - 1.00 mg/dL   Calcium 9.7 8.9 - 10.3 mg/dL   GFR calc non Af Amer 60 (L) >60 mL/min   GFR calc Af Amer >60 >60 mL/min    Comment: (NOTE) The eGFR has been calculated using the CKD EPI equation. This calculation has not been validated in all clinical situations. eGFR's persistently <60 mL/min signify possible Chronic Kidney Disease.    Anion gap 10 5 - 15  CBC     Status: None   Collection Time: 12/05/16 10:51 AM  Result Value Ref Range   WBC 6.8 3.6 - 11.0 K/uL   RBC 4.78 3.80 - 5.20 MIL/uL   Hemoglobin 15.0 12.0 - 16.0 g/dL   HCT 45.0 35.0 - 47.0 %   MCV 94.1 80.0 - 100.0 fL   MCH 31.3 26.0 - 34.0 pg   MCHC 33.2 32.0 - 36.0 g/dL   RDW 14.0 11.5 - 14.5 %   Platelets 220 150 - 440 K/uL  CK     Status: Abnormal   Collection Time: 12/05/16 10:51 AM  Result Value Ref Range   Total CK 37 (L) 38 - 234 U/L  Urinalysis, Complete w Microscopic     Status: Abnormal   Collection Time: 12/05/16 10:56 AM  Result Value Ref Range   Color, Urine YELLOW (A) YELLOW   APPearance CLEAR (A) CLEAR   Specific Gravity, Urine 1.009 1.005 - 1.030   pH 6.0 5.0 - 8.0   Glucose, UA NEGATIVE NEGATIVE mg/dL   Hgb urine dipstick MODERATE (A) NEGATIVE   Bilirubin Urine  NEGATIVE NEGATIVE   Ketones, ur NEGATIVE NEGATIVE mg/dL   Protein, ur NEGATIVE NEGATIVE mg/dL   Nitrite NEGATIVE NEGATIVE   Leukocytes, UA SMALL (A) NEGATIVE   RBC / HPF 0-5 0 - 5 RBC/hpf   WBC, UA 6-30 0 - 5 WBC/hpf   Bacteria, UA RARE (A) NONE SEEN   Squamous Epithelial / LPF 0-5 (A) NONE SEEN   Mucus PRESENT   Urine Culture     Status: Abnormal   Collection Time: 12/05/16 10:56 AM  Result Value Ref Range   Specimen Description URINE, RANDOM    Special Requests NONE    Culture (A)     <10,000 COLONIES/mL INSIGNIFICANT GROWTH Performed at [facility], [address] [place], [place] [address]    Report Status 12/07/2016 FINAL  Urine Drug Screen, Qualitative ([facility] only)     Status: None   Collection Time: 12/05/16 10:56 AM  Result Value Ref Range   Tricyclic, Ur Screen NONE DETECTED NONE DETECTED   Amphetamines, Ur Screen NONE DETECTED NONE DETECTED   MDMA (Ecstasy)Ur Screen NONE DETECTED NONE DETECTED   Cocaine Metabolite,Ur [place] NONE DETECTED NONE DETECTED   Opiate, Ur Screen NONE DETECTED NONE DETECTED   Phencyclidine (PCP) Ur S NONE DETECTED NONE DETECTED   Cannabinoid 50 Ng, Ur [place] NONE DETECTED NONE DETECTED   Barbiturates, Ur Screen NONE DETECTED NONE DETECTED   Benzodiazepine, Ur Scrn NONE DETECTED NONE DETECTED   Methadone Scn, Ur NONE DETECTED NONE DETECTED    Comment: (NOTE) 209  Tricyclics, urine               Cutoff 1000 ng/mL 200  Amphetamines, urine             Cutoff 1000 ng/mL 300  MDMA (Ecstasy), urine           Cutoff 500 ng/mL 400  Cocaine Metabolite, urine       Cutoff 300 ng/mL 500  Opiate, urine                   Cutoff 300 ng/mL 600  Phencyclidine (PCP), urine      Cutoff 25 ng/mL 700  Cannabinoid, urine              Cutoff 50 ng/mL 800  Barbiturates, urine             Cutoff 200 ng/mL 900  Benzodiazepine, urine           Cutoff 200 ng/mL 1000 Methadone, urine                Cutoff 300 ng/mL 1100 1200 The urine drug screen provides only a  preliminary, unconfirmed 1300 analytical test result and should not be used for non-medical 1400 purposes. Clinical consideration and professional judgment should 1500 be applied to any positive drug screen result due to possible 1600 interfering substances. A more specific alternate chemical method 1700 must be used in order to obtain a confirmed analytical result.  1800 Gas chromato graphy / mass spectrometry (GC/MS) is the preferred 1900 confirmatory method.   Glucose, capillary     Status: None   Collection Time: 12/05/16 11:06 AM  Result Value Ref Range   Glucose-Capillary 99 65 - 99 mg/dL   Comment 1 Notify RN   Comprehensive metabolic panel     Status: Abnormal   Collection Time: 12/09/16  7:19 PM  Result Value Ref Range   Sodium 141 135 - 145 mmol/L   Potassium 3.6 3.5 - 5.1 mmol/L   Chloride 103 101 - 111 mmol/L   CO2 26 22 - 32 mmol/L   Glucose, Bld 98 65 - 99 mg/dL   BUN 12 6 - 20 mg/dL   Creatinine, Ser 1.09 (H) 0.44 - 1.00 mg/dL   Calcium 9.8 8.9 - 10.3 mg/dL   Total Protein 7.5 6.5 - 8.1 g/dL   Albumin 4.1 3.5 - 5.0 g/dL   AST 29 15 - 41 U/L   ALT 13 (L) 14 - 54 U/L   Alkaline Phosphatase 91 38 - 126 U/L   Total Bilirubin 0.7 0.3 - 1.2 mg/dL   GFR calc non Af Amer 49 (L) >60 mL/min   GFR calc Af Amer 57 (L) >60 mL/min    Comment: (NOTE) The eGFR has been calculated using the  CKD EPI equation. This calculation has not been validated in all clinical situations. eGFR's persistently <60 mL/min signify possible Chronic Kidney Disease.    Anion gap 12 5 - 15  Ethanol     Status: None   Collection Time: 12/09/16  7:19 PM  Result Value Ref Range   Alcohol, Ethyl (B) <10 <10 mg/dL    Comment:        LOWEST DETECTABLE LIMIT FOR SERUM ALCOHOL IS 10 mg/dL FOR MEDICAL PURPOSES ONLY   Salicylate level     Status: None   Collection Time: 12/09/16  7:19 PM  Result Value Ref Range   Salicylate Lvl 9.9 2.8 - 30.0 mg/dL  Acetaminophen level     Status: Abnormal    Collection Time: 12/09/16  7:19 PM  Result Value Ref Range   Acetaminophen (Tylenol), Serum <10 (L) 10 - 30 ug/mL    Comment:        THERAPEUTIC CONCENTRATIONS VARY SIGNIFICANTLY. A RANGE OF 10-30 ug/mL MAY BE AN EFFECTIVE CONCENTRATION FOR MANY PATIENTS. HOWEVER, SOME ARE BEST TREATED AT CONCENTRATIONS OUTSIDE THIS RANGE. ACETAMINOPHEN CONCENTRATIONS >150 ug/mL AT 4 HOURS AFTER INGESTION AND >50 ug/mL AT 12 HOURS AFTER INGESTION ARE OFTEN ASSOCIATED WITH TOXIC REACTIONS.   cbc     Status: None   Collection Time: 12/09/16  7:19 PM  Result Value Ref Range   WBC 8.9 3.6 - 11.0 K/uL   RBC 4.70 3.80 - 5.20 MIL/uL   Hemoglobin 14.8 12.0 - 16.0 g/dL   HCT 44.8 35.0 - 47.0 %   MCV 95.3 80.0 - 100.0 fL   MCH 31.5 26.0 - 34.0 pg   MCHC 33.1 32.0 - 36.0 g/dL   RDW 14.2 11.5 - 14.5 %   Platelets 275 150 - 440 K/uL  Urine Drug Screen, Qualitative     Status: Abnormal   Collection Time: 12/10/16  7:40 AM  Result Value Ref Range   Tricyclic, Ur Screen NONE DETECTED NONE DETECTED   Amphetamines, Ur Screen NONE DETECTED NONE DETECTED   MDMA (Ecstasy)Ur Screen NONE DETECTED NONE DETECTED   Cocaine Metabolite,Ur [place] NONE DETECTED NONE DETECTED   Opiate, Ur Screen NONE DETECTED NONE DETECTED   Phencyclidine (PCP) Ur S NONE DETECTED NONE DETECTED   Cannabinoid 50 Ng, Ur [place] NONE DETECTED NONE DETECTED   Barbiturates, Ur Screen NONE DETECTED NONE DETECTED   Benzodiazepine, Ur Scrn POSITIVE (A) NONE DETECTED   Methadone Scn, Ur NONE DETECTED NONE DETECTED    Comment: (NOTE) 557  Tricyclics, urine               Cutoff 1000 ng/mL 200  Amphetamines, urine             Cutoff 1000 ng/mL 300  MDMA (Ecstasy), urine           Cutoff 500 ng/mL 400  Cocaine Metabolite, urine       Cutoff 300 ng/mL 500  Opiate, urine                   Cutoff 300 ng/mL 600  Phencyclidine (PCP), urine      Cutoff 25 ng/mL 700  Cannabinoid, urine              Cutoff 50 ng/mL 800  Barbiturates, urine             Cutoff  200 ng/mL 900  Benzodiazepine, urine           Cutoff 200 ng/mL 1000 Methadone, urine  Cutoff 300 ng/mL 1100 1200 The urine drug screen provides only a preliminary, unconfirmed 1300 analytical test result and should not be used for non-medical 1400 purposes. Clinical consideration and professional judgment should 1500 be applied to any positive drug screen result due to possible 1600 interfering substances. A more specific alternate chemical method 1700 must be used in order to obtain a confirmed analytical result.  1800 Gas chromato graphy / mass spectrometry (GC/MS) is the preferred 1900 confirmatory method.   Comprehensive metabolic panel     Status: Abnormal   Collection Time: 12/18/16  3:39 PM  Result Value Ref Range   Sodium 142 135 - 145 mmol/L   Potassium 4.0 3.5 - 5.1 mmol/L   Chloride 105 101 - 111 mmol/L   CO2 27 22 - 32 mmol/L   Glucose, Bld 104 (H) 65 - 99 mg/dL   BUN 20 6 - 20 mg/dL   Creatinine, Ser 0.68 0.44 - 1.00 mg/dL   Calcium 9.5 8.9 - 10.3 mg/dL   Total Protein 7.6 6.5 - 8.1 g/dL   Albumin 4.0 3.5 - 5.0 g/dL   AST 22 15 - 41 U/L   ALT 17 14 - 54 U/L   Alkaline Phosphatase 80 38 - 126 U/L   Total Bilirubin 0.8 0.3 - 1.2 mg/dL   GFR calc non Af Amer >60 >60 mL/min   GFR calc Af Amer >60 >60 mL/min    Comment: (NOTE) The eGFR has been calculated using the CKD EPI equation. This calculation has not been validated in all clinical situations. eGFR's persistently <60 mL/min signify possible Chronic Kidney Disease.    Anion gap 10 5 - 15  CBC with Differential     Status: None   Collection Time: 12/18/16  3:39 PM  Result Value Ref Range   WBC 9.5 3.6 - 11.0 K/uL   RBC 4.43 3.80 - 5.20 MIL/uL   Hemoglobin 14.0 12.0 - 16.0 g/dL   HCT 41.4 35.0 - 47.0 %   MCV 93.6 80.0 - 100.0 fL   MCH 31.6 26.0 - 34.0 pg   MCHC 33.8 32.0 - 36.0 g/dL   RDW 13.6 11.5 - 14.5 %   Platelets 261 150 - 440 K/uL   Neutrophils Relative % 65 %   Neutro Abs 6.2 1.4  - 6.5 K/uL   Lymphocytes Relative 26 %   Lymphs Abs 2.5 1.0 - 3.6 K/uL   Monocytes Relative 7 %   Monocytes Absolute 0.7 0.2 - 0.9 K/uL   Eosinophils Relative 1 %   Eosinophils Absolute 0.1 0 - 0.7 K/uL   Basophils Relative 1 %   Basophils Absolute 0.1 0 - 0.1 K/uL  Urinalysis, Complete w Microscopic     Status: Abnormal   Collection Time: 12/18/16  3:39 PM  Result Value Ref Range   Color, Urine YELLOW (A) YELLOW   APPearance CLOUDY (A) CLEAR   Specific Gravity, Urine 1.017 1.005 - 1.030   pH 6.0 5.0 - 8.0   Glucose, UA NEGATIVE NEGATIVE mg/dL   Hgb urine dipstick NEGATIVE NEGATIVE   Bilirubin Urine NEGATIVE NEGATIVE   Ketones, ur 20 (A) NEGATIVE mg/dL   Protein, ur 30 (A) NEGATIVE mg/dL   Nitrite NEGATIVE NEGATIVE   Leukocytes, UA LARGE (A) NEGATIVE   RBC / HPF 6-30 0 - 5 RBC/hpf   WBC, UA TOO NUMEROUS TO COUNT 0 - 5 WBC/hpf   Bacteria, UA NONE SEEN NONE SEEN   Squamous Epithelial / LPF 6-30 (A) NONE SEEN  Mucus PRESENT   CBC with Differential     Status: Abnormal   Collection Time: 01/04/17 12:28 PM  Result Value Ref Range   WBC 9.8 3.6 - 11.0 K/uL   RBC 4.20 3.80 - 5.20 MIL/uL   Hemoglobin 13.3 12.0 - 16.0 g/dL   HCT 39.2 35.0 - 47.0 %   MCV 93.4 80.0 - 100.0 fL   MCH 31.6 26.0 - 34.0 pg   MCHC 33.9 32.0 - 36.0 g/dL   RDW 14.1 11.5 - 14.5 %   Platelets 232 150 - 440 K/uL   Neutrophils Relative % 89 %   Neutro Abs 8.8 (H) 1.4 - 6.5 K/uL   Lymphocytes Relative 8 %   Lymphs Abs 0.7 (L) 1.0 - 3.6 K/uL   Monocytes Relative 3 %   Monocytes Absolute 0.3 0.2 - 0.9 K/uL   Eosinophils Relative 0 %   Eosinophils Absolute 0.0 0 - 0.7 K/uL   Basophils Relative 0 %   Basophils Absolute 0.0 0 - 0.1 K/uL  Comprehensive metabolic panel     Status: Abnormal   Collection Time: 01/04/17 12:28 PM  Result Value Ref Range   Sodium 140 135 - 145 mmol/L   Potassium 4.2 3.5 - 5.1 mmol/L   Chloride 104 101 - 111 mmol/L   CO2 27 22 - 32 mmol/L   Glucose, Bld 130 (H) 65 - 99 mg/dL    BUN 18 6 - 20 mg/dL   Creatinine, Ser 0.79 0.44 - 1.00 mg/dL   Calcium 8.6 (L) 8.9 - 10.3 mg/dL   Total Protein 5.8 (L) 6.5 - 8.1 g/dL   Albumin 3.1 (L) 3.5 - 5.0 g/dL   AST 23 15 - 41 U/L   ALT 34 14 - 54 U/L   Alkaline Phosphatase 80 38 - 126 U/L   Total Bilirubin 0.3 0.3 - 1.2 mg/dL   GFR calc non Af Amer >60 >60 mL/min   GFR calc Af Amer >60 >60 mL/min    Comment: (NOTE) The eGFR has been calculated using the CKD EPI equation. This calculation has not been validated in all clinical situations. eGFR's persistently <60 mL/min signify possible Chronic Kidney Disease.    Anion gap 9 5 - 15  Fibrin derivatives D-Dimer ([facility] only)     Status: None   Collection Time: 01/04/17 12:28 PM  Result Value Ref Range   Fibrin derivatives D-dimer ([facility]) 222.39 0.00 - 499.00 ng/mL (FEU)    Comment: (NOTE) <> Exclusion of Venous Thromboembolism (VTE) - OUTPATIENT ONLY   (Emergency Department or [place])   0-499 ng/ml (FEU): With a low to intermediate pretest probability                      for VTE this test result excludes the diagnosis                      of VTE.   >499 ng/ml (FEU) : VTE not excluded; additional work up for VTE is                      required. <> Testing on Inpatients and Evaluation of Disseminated Intravascular   Coagulation (DIC) Reference Range:   0-499 ng/ml (FEU)      Assessment & Plan  1. Chronic right hip pain - Ambulatory referral to Pain Clinic - Lidocaine Ointment and Gabapentin per [facility]. - Patient and patient's husband requested percocet during visit, explained that this is not recommended  first-line treatment for post-herpetic neuralgia/neuropathic pain and that we are unable to Rx for chronic pain in our clinic.  Will defer to pain management for medication management. - Patient and patient's husband asked multiple times what to do if Lidocaine Ointment refills are not available at [facility] - advised that the pharmacy may send our clinic an  electronic refill request.

## 2017-01-22 DIAGNOSIS — M542 Cervicalgia: Secondary | ICD-10-CM | POA: Diagnosis not present

## 2017-01-22 DIAGNOSIS — M25512 Pain in left shoulder: Secondary | ICD-10-CM | POA: Diagnosis not present

## 2017-01-22 DIAGNOSIS — M25511 Pain in right shoulder: Secondary | ICD-10-CM | POA: Diagnosis not present

## 2017-01-23 ENCOUNTER — Telehealth: Payer: Self-pay

## 2017-01-23 ENCOUNTER — Telehealth: Payer: Self-pay | Admitting: Family Medicine

## 2017-01-23 DIAGNOSIS — G8929 Other chronic pain: Secondary | ICD-10-CM

## 2017-01-23 DIAGNOSIS — M25551 Pain in right hip: Principal | ICD-10-CM

## 2017-01-23 NOTE — Telephone Encounter (Signed)
Per last visit note, patient is to be using Lidocaine ointment - she was instructed to call back if refill of this ointment was not available at the pharmacy. Please call pharmacy to confirm that pt has refill available, then contact pt and instruct her to use lidocaine ointment for her right hip pain (this is the same strength as the patches), and may be applied 3-4 times a day as needed.  If new Rx is needed for ointment, I will provide. Thank you!

## 2017-01-23 NOTE — Telephone Encounter (Signed)
Copied from [place] [phone]. Topic: Quick Communication - Rx Refill/Question >> Jan 23, 2017 12:03 PM Oliver Pila B wrote: Pt called b/c she was told by Dr. Ancil Boozer to call in to the practice if she needed the shingles patch, which she does, contact pt if information is needed

## 2017-01-23 NOTE — Telephone Encounter (Signed)
See other telephone encounter from today 01/23/2017.

## 2017-01-23 NOTE — Telephone Encounter (Signed)
Patient called back about getting Lidoderm patch instead of Oxycodone for her shingles pain. She would like for you to prescribe her the patch.

## 2017-01-24 MED ORDER — LIDOCAINE 5 % EX PTCH: 1.0000 | MEDICATED_PATCH | CUTANEOUS | 0 refills | Status: DC

## 2017-01-24 NOTE — Telephone Encounter (Signed)
Lidoderm patches sent to [facility] as alternative. Please notify patient and ensure [facility] cancels order for Lidocaine ointment. Thanks!

## 2017-01-24 NOTE — Telephone Encounter (Signed)
A prior auth was performed online and it was denied due to the dx chronic hip pain. A fax from her insurance company will be sent to our office with more instructions of what is covered.

## 2017-01-24 NOTE — Telephone Encounter (Signed)
Patient has been informed of the information below via voicemail and was told to give our office a call if she had any questions.

## 2017-01-29 DIAGNOSIS — M7981 Nontraumatic hematoma of soft tissue: Secondary | ICD-10-CM | POA: Diagnosis not present

## 2017-01-29 DIAGNOSIS — M25522 Pain in left elbow: Secondary | ICD-10-CM | POA: Diagnosis not present

## 2017-02-04 DIAGNOSIS — M19022 Primary osteoarthritis, left elbow: Secondary | ICD-10-CM | POA: Diagnosis not present

## 2017-02-10 ENCOUNTER — Other Ambulatory Visit: Payer: Self-pay | Admitting: Family Medicine

## 2017-02-10 DIAGNOSIS — J441 Chronic obstructive pulmonary disease with (acute) exacerbation: Secondary | ICD-10-CM

## 2017-02-12 DIAGNOSIS — Z8042 Family history of malignant neoplasm of prostate: Secondary | ICD-10-CM | POA: Diagnosis not present

## 2017-02-12 DIAGNOSIS — Z8481 Family history of carrier of genetic disease: Secondary | ICD-10-CM | POA: Diagnosis not present

## 2017-02-12 DIAGNOSIS — Z803 Family history of malignant neoplasm of breast: Secondary | ICD-10-CM | POA: Diagnosis not present

## 2017-02-12 DIAGNOSIS — Z806 Family history of leukemia: Secondary | ICD-10-CM | POA: Diagnosis not present

## 2017-02-18 ENCOUNTER — Encounter: Payer: Self-pay | Admitting: Family Medicine

## 2017-02-18 ENCOUNTER — Ambulatory Visit (INDEPENDENT_AMBULATORY_CARE_PROVIDER_SITE_OTHER): Payer: Medicare HMO | Admitting: Family Medicine

## 2017-02-18 VITALS — BP 134/76 | HR 109 | Temp 97.9°F | Resp 20 | Ht 65.0 in | Wt 112.2 lb

## 2017-02-18 DIAGNOSIS — F339 Major depressive disorder, recurrent, unspecified: Secondary | ICD-10-CM

## 2017-02-18 DIAGNOSIS — I10 Essential (primary) hypertension: Secondary | ICD-10-CM

## 2017-02-18 DIAGNOSIS — R2681 Unsteadiness on feet: Secondary | ICD-10-CM | POA: Diagnosis not present

## 2017-02-18 DIAGNOSIS — E441 Mild protein-calorie malnutrition: Secondary | ICD-10-CM

## 2017-02-18 DIAGNOSIS — I69359 Hemiplegia and hemiparesis following cerebral infarction affecting unspecified side: Secondary | ICD-10-CM | POA: Diagnosis not present

## 2017-02-18 DIAGNOSIS — F1320 Sedative, hypnotic or anxiolytic dependence, uncomplicated: Secondary | ICD-10-CM | POA: Diagnosis not present

## 2017-02-18 DIAGNOSIS — D692 Other nonthrombocytopenic purpura: Secondary | ICD-10-CM

## 2017-02-18 DIAGNOSIS — J449 Chronic obstructive pulmonary disease, unspecified: Secondary | ICD-10-CM

## 2017-02-18 DIAGNOSIS — J42 Unspecified chronic bronchitis: Secondary | ICD-10-CM | POA: Diagnosis not present

## 2017-02-18 DIAGNOSIS — E785 Hyperlipidemia, unspecified: Secondary | ICD-10-CM

## 2017-02-18 DIAGNOSIS — R69 Illness, unspecified: Secondary | ICD-10-CM | POA: Diagnosis not present

## 2017-02-18 MED ORDER — IPRATROPIUM-ALBUTEROL 20-100 MCG/ACT IN AERS: INHALATION_SPRAY | RESPIRATORY_TRACT | 0 refills | Status: DC

## 2017-02-18 MED ORDER — METOPROLOL SUCCINATE ER 25 MG PO TB24: 25.0000 mg | ORAL_TABLET | Freq: Every day | ORAL | 1 refills | Status: DC

## 2017-02-18 MED ORDER — ATORVASTATIN CALCIUM 40 MG PO TABS: ORAL_TABLET | ORAL | 1 refills | Status: DC

## 2017-02-18 MED ORDER — FLUTICASONE FUROATE-VILANTEROL 100-25 MCG/INH IN AEPB: 1.0000 | INHALATION_SPRAY | Freq: Every day | RESPIRATORY_TRACT | 5 refills | Status: DC

## 2017-02-18 NOTE — Progress Notes (Signed)
Name: Beth [name]   MRN: [id number]    DOB: May 31, 1945   Date:02/18/2017       Progress Note  Subjective  Chief Complaint  Chief Complaint  Patient presents with  . Hip Pain    Onset-1 month ago,constant right hip and leg pain-aching, denies any trauma.  . Hypertension  . COPD    HPI   Major Depression: last admission  07/13/2016  admitted for suicide prevention at [facility].  She  is now under the care of Dr. Loni Muse at [facility]. She is compliant with medication but still depressed , tired of all the pain she has been under on right leg since shingles.   COPD severe: she has been   compliant with Bre[place]ut takes Combivent prn. She had quit smoking in 2014 but has been smoking again daily, half pack day, she states she resumed smoking because of depression, she states it is a distraction. She has a chronic cough, most days, mild decrease in exercise tolerance, and occasional wheezing. She states sputum is clear/ slimy.   HTN: taking metoprolol for bp and migraine prevention. Tremors much better, finally off BZD.   History of CVA with left side hemiparesis: doing much better, very mild symptoms now. Taking aspirin and statin therapy, she is still slightly weak on left leg, but now having problems with right leg because shingles and chronic right hip pain since. She has an appointment with pain clinic next week.  Right hip pain and history of shingles on right leg: she was seen at [facility] and given antiviral, also here and given lidocaine, states pain is severe on right leg, and continues to have to use a cane, feels unsteady and pain affects her sleep and ambulation. She does not drive, we will order her for home PT   Metabolic Syndrome: she does not have DM, only metabolic syndrome. Last hgbA1C was 5.7%, down to 5.0 % during recent hospital stay. She denies polyphagia, polydipsia or polyuria.  Dyslipidemia: reminded her of life style modification   Weight loss/malnutrtion: she has been  noticing problems with her dentures since Nov 2018 , she states has pain in her mouth that affects her ability of eating. Advised to follow up with dentist.   Patient Active Problem List   Diagnosis Date Noted  . Urinary tract infection 12/05/2016  . Protein-calorie malnutrition, mild ([place]) 08/07/2016  . Generalized anxiety disorder 07/15/2016  . Chronic neck and back pain 07/15/2016  . Severe episode of recurrent major depressive disorder, without psychotic features ([place]) 07/14/2016  . Moderate benzodiazepine use disorder ([place]) 05/08/2016  . Major depressive disorder, recurrent, severe w/o psychotic behavior ([place]) 05/01/2016  . Seasonal allergic rhinitis 03/23/2015  . Hyperglycemia 03/23/2015  . Senile purpura ([place]) 03/23/2015  . Perennial allergic rhinitis with seasonal variation 03/23/2015  . Marital problems 10/31/2014  . Migraine without aura and without status migrainosus, not intractable 10/31/2014  . Chronic LBP 08/09/2014  . Colon polyp 08/09/2014  . COPD, severe ([place]) 08/09/2014  . CVA, old, hemiparesis ([place]) 08/09/2014  . Dyslipidemia 08/09/2014  . Dysfunction of eustachian tube 08/09/2014  . Fibromyalgia syndrome 08/09/2014  . Gastro-esophageal reflux disease without esophagitis 08/09/2014  . Benign migrating glossitis 08/09/2014  . Cerebrovascular accident, old 08/09/2014  . IBS (irritable bowel syndrome) 08/09/2014  . Low back pain with radiation 08/09/2014  . Chronic recurrent major depressive disorder ([place]) 08/09/2014  . Dysmetabolic syndrome 18/29/9371  . OP (osteoporosis) 08/09/2014  . Vitamin D deficiency 08/09/2014  . Benign hypertension  07/19/2013  . Benign neoplasm of skin of trunk 06/03/2013  . H/O: pneumonia 09/25/2012    Past Surgical History:  Procedure Laterality Date  . ABDOMINAL HYSTERECTOMY    . APPENDECTOMY    . BREAST SURGERY  2011   biopsy  . CERVICAL DISCECTOMY    . CHOLECYSTECTOMY    . SINUSOTOMY      Family History  Problem  Relation Age of Onset  . Anxiety disorder Mother   . Depression Mother   . Cancer Father   . Gallbladder disease Father   . Alcohol abuse Father   . Depression Father     Social History   Socioeconomic History  . Marital status: Married    Spouse name: Not on file  . Number of children: Not on file  . Years of education: Not on file  . Highest education level: Not on file  Social Needs  . Financial resource strain: Not on file  . Food insecurity - worry: Not on file  . Food insecurity - inability: Not on file  . Transportation needs - medical: Not on file  . Transportation needs - non-medical: Not on file  Occupational History  . Not on file  Tobacco Use  . Smoking status: Current Some Day Smoker    Packs/day: 1.00    Years: 35.00    Pack years: 35.00    Types: Cigarettes  . Smokeless tobacco: Never Used  . Tobacco comment: Restarted at 1/2 a pack a day.   Substance and Sexual Activity  . Alcohol use: No    Alcohol/week: 0.0 oz  . Drug use: No  . Sexual activity: No  Other Topics Concern  . Not on file  Social History Narrative  . Not on file     Current Outpatient Medications:  .  acetaminophen (TYLENOL) 325 MG tablet, Take 650 mg by mouth every 4 (four) hours as needed., Disp: , Rfl:  .  ARIPiprazole (ABILIFY) 5 MG tablet, Take 5 mg by mouth daily., Disp: , Rfl:  .  aspirin 81 MG chewable tablet, Chew 81 mg daily by mouth., Disp: , Rfl:  .  atorvastatin (LIPITOR) 40 MG tablet, TAKE 1 TABLET(40 MG) BY MOUTH DAILY, Disp: 90 tablet, Rfl: 1 .  azelastine (ASTELIN) 0.1 % nasal spray, Place 1 spray into both nostrils 2 (two) times daily. Use in each nostril as directed, Disp: 30 mL, Rfl: 2 .  escitalopram (LEXAPRO) 5 MG tablet, Take 5 mg by mouth daily., Disp: , Rfl:  .  fluticasone (FLONASE) 50 MCG/ACT nasal spray, Place 2 sprays into both nostrils daily., Disp: 16 g, Rfl: 2 .  fluticasone furoate-vilanterol (BREO ELLIPTA) 100-25 MCG/INH AEPB, Inhale 1 puff into the  lungs daily., Disp: 60 each, Rfl: 5 .  gabapentin (NEURONTIN) 300 MG capsule, Take 1 capsule (300 mg total) by mouth 3 (three) times daily. (Patient taking differently: Take 300 mg by mouth 4 (four) times daily. ), Disp: 90 capsule, Rfl: 2 .  Ipratropium-Albuterol (COMBIVENT RESPIMAT) 20-100 MCG/ACT AERS respimat, INHALE 1 PUFF BY MOUTH EVERY 6 HOURS. MUST LAST 3 MONTHS, Disp: 1 Inhaler, Rfl: 0 .  metoprolol succinate (TOPROL-XL) 25 MG 24 hr tablet, Take 1 tablet (25 mg total) by mouth daily., Disp: 90 tablet, Rfl: 1 .  Multiple Vitamin (THERA) TABS, Take by mouth., Disp: , Rfl:   Allergies  Allergen Reactions  . Bextra  [Valdecoxib]   . Compazine  [Prochlorperazine Edisylate]   . Lithium Carbonate   . Lyrica [Pregabalin]  ROS  Constitutional: Negative for fever or weight change.  Respiratory: Negative for cough and shortness of breath.   Cardiovascular: Negative for chest pain or palpitations.  Gastrointestinal: Negative for abdominal pain, no bowel changes.  Musculoskeletal: Negative for gait problem or joint swelling.  Skin: Negative for rash.  Neurological: Negative for dizziness or headache.  No other specific complaints in a complete review of systems (except as listed in HPI above).  Objective  Vitals:   02/18/17 1140  BP: 134/76  Pulse: (!) 109  Resp: 20  Temp: 97.9 F (36.6 C)  TempSrc: Oral  SpO2: 97%  Weight: 112 lb 3.2 oz (50.9 kg)  Height: '5\' 5"'  (1.651 m)    Body mass index is 18.67 kg/m.  Physical Exam  Constitutional: Patient appears well-developed and well-nourished. No distress.  HEENT: head atraumatic, normocephalic, pupils equal and reactive to light, neck supple, throat within normal limits, no lesions during oral exam Cardiovascular: Normal rate, regular rhythm and normal heart sounds.  No murmur heard. No BLE edema. Pulmonary/Chest: Effort normal and breath sounds normal. No respiratory distress. Abdominal: Soft.  There is no  tenderness. Psychiatric: Patient has a flat affect, . behavior is normal. Judgment and thought content normal. Muscular Skeletal :   Recent Results (from the past 2160 hour(s))  Basic metabolic panel     Status: Abnormal   Collection Time: 12/05/16 10:51 AM  Result Value Ref Range   Sodium 141 135 - 145 mmol/L   Potassium 4.0 3.5 - 5.1 mmol/L   Chloride 103 101 - 111 mmol/L   CO2 28 22 - 32 mmol/L   Glucose, Bld 113 (H) 65 - 99 mg/dL   BUN 14 6 - 20 mg/dL   Creatinine, Ser 0.93 0.44 - 1.00 mg/dL   Calcium 9.7 8.9 - 10.3 mg/dL   GFR calc non Af Amer 60 (L) >60 mL/min   GFR calc Af Amer >60 >60 mL/min    Comment: (NOTE) The eGFR has been calculated using the CKD EPI equation. This calculation has not been validated in all clinical situations. eGFR's persistently <60 mL/min signify possible Chronic Kidney Disease.    Anion gap 10 5 - 15  CBC     Status: None   Collection Time: 12/05/16 10:51 AM  Result Value Ref Range   WBC 6.8 3.6 - 11.0 K/uL   RBC 4.78 3.80 - 5.20 MIL/uL   Hemoglobin 15.0 12.0 - 16.0 g/dL   HCT 45.0 35.0 - 47.0 %   MCV 94.1 80.0 - 100.0 fL   MCH 31.3 26.0 - 34.0 pg   MCHC 33.2 32.0 - 36.0 g/dL   RDW 14.0 11.5 - 14.5 %   Platelets 220 150 - 440 K/uL  CK     Status: Abnormal   Collection Time: 12/05/16 10:51 AM  Result Value Ref Range   Total CK 37 (L) 38 - 234 U/L  Urinalysis, Complete w Microscopic     Status: Abnormal   Collection Time: 12/05/16 10:56 AM  Result Value Ref Range   Color, Urine YELLOW (A) YELLOW   APPearance CLEAR (A) CLEAR   Specific Gravity, Urine 1.009 1.005 - 1.030   pH 6.0 5.0 - 8.0   Glucose, UA NEGATIVE NEGATIVE mg/dL   Hgb urine dipstick MODERATE (A) NEGATIVE   Bilirubin Urine NEGATIVE NEGATIVE   Ketones, ur NEGATIVE NEGATIVE mg/dL   Protein, ur NEGATIVE NEGATIVE mg/dL   Nitrite NEGATIVE NEGATIVE   Leukocytes, UA SMALL (A) NEGATIVE   RBC / HPF  0-5 0 - 5 RBC/hpf   WBC, UA 6-30 0 - 5 WBC/hpf   Bacteria, UA RARE (A) NONE  SEEN   Squamous Epithelial / LPF 0-5 (A) NONE SEEN   Mucus PRESENT   Urine Culture     Status: Abnormal   Collection Time: 12/05/16 10:56 AM  Result Value Ref Range   Specimen Description URINE, RANDOM    Special Requests NONE    Culture (A)     <10,000 COLONIES/mL INSIGNIFICANT GROWTH Performed at [facility], [place] [address], [place], [place] [address]    Report Status 12/07/2016 FINAL   Urine Drug Screen, Qualitative ([facility] only)     Status: None   Collection Time: 12/05/16 10:56 AM  Result Value Ref Range   Tricyclic, Ur Screen NONE DETECTED NONE DETECTED   Amphetamines, Ur Screen NONE DETECTED NONE DETECTED   MDMA (Ecstasy)Ur Screen NONE DETECTED NONE DETECTED   Cocaine Metabolite,Ur [place] NONE DETECTED NONE DETECTED   Opiate, Ur Screen NONE DETECTED NONE DETECTED   Phencyclidine (PCP) Ur S NONE DETECTED NONE DETECTED   Cannabinoid 50 Ng, Ur [place] NONE DETECTED NONE DETECTED   Barbiturates, Ur Screen NONE DETECTED NONE DETECTED   Benzodiazepine, Ur Scrn NONE DETECTED NONE DETECTED   Methadone Scn, Ur NONE DETECTED NONE DETECTED    Comment: (NOTE) 160  Tricyclics, urine               Cutoff 1000 ng/mL 200  Amphetamines, urine             Cutoff 1000 ng/mL 300  MDMA (Ecstasy), urine           Cutoff 500 ng/mL 400  Cocaine Metabolite, urine       Cutoff 300 ng/mL 500  Opiate, urine                   Cutoff 300 ng/mL 600  Phencyclidine (PCP), urine      Cutoff 25 ng/mL 700  Cannabinoid, urine              Cutoff 50 ng/mL 800  Barbiturates, urine             Cutoff 200 ng/mL 900  Benzodiazepine, urine           Cutoff 200 ng/mL 1000 Methadone, urine                Cutoff 300 ng/mL 1100 1200 The urine drug screen provides only a preliminary, unconfirmed 1300 analytical test result and should not be used for non-medical 1400 purposes. Clinical consideration and professional judgment should 1500 be applied to any positive drug screen result due to possible 1600 interfering  substances. A more specific alternate chemical method 1700 must be used in order to obtain a confirmed analytical result.  1800 Gas chromato graphy / mass spectrometry (GC/MS) is the preferred 1900 confirmatory method.   Glucose, capillary     Status: None   Collection Time: 12/05/16 11:06 AM  Result Value Ref Range   Glucose-Capillary 99 65 - 99 mg/dL   Comment 1 Notify RN   Comprehensive metabolic panel     Status: Abnormal   Collection Time: 12/09/16  7:19 PM  Result Value Ref Range   Sodium 141 135 - 145 mmol/L   Potassium 3.6 3.5 - 5.1 mmol/L   Chloride 103 101 - 111 mmol/L   CO2 26 22 - 32 mmol/L   Glucose, Bld 98 65 - 99 mg/dL   BUN 12 6 -  20 mg/dL   Creatinine, Ser 1.09 (H) 0.44 - 1.00 mg/dL   Calcium 9.8 8.9 - 10.3 mg/dL   Total Protein 7.5 6.5 - 8.1 g/dL   Albumin 4.1 3.5 - 5.0 g/dL   AST 29 15 - 41 U/L   ALT 13 (L) 14 - 54 U/L   Alkaline Phosphatase 91 38 - 126 U/L   Total Bilirubin 0.7 0.3 - 1.2 mg/dL   GFR calc non Af Amer 49 (L) >60 mL/min   GFR calc Af Amer 57 (L) >60 mL/min    Comment: (NOTE) The eGFR has been calculated using the CKD EPI equation. This calculation has not been validated in all clinical situations. eGFR's persistently <60 mL/min signify possible Chronic Kidney Disease.    Anion gap 12 5 - 15  Ethanol     Status: None   Collection Time: 12/09/16  7:19 PM  Result Value Ref Range   Alcohol, Ethyl (B) <10 <10 mg/dL    Comment:        LOWEST DETECTABLE LIMIT FOR SERUM ALCOHOL IS 10 mg/dL FOR MEDICAL PURPOSES ONLY   Salicylate level     Status: None   Collection Time: 12/09/16  7:19 PM  Result Value Ref Range   Salicylate Lvl 9.9 2.8 - 30.0 mg/dL  Acetaminophen level     Status: Abnormal   Collection Time: 12/09/16  7:19 PM  Result Value Ref Range   Acetaminophen (Tylenol), Serum <10 (L) 10 - 30 ug/mL    Comment:        THERAPEUTIC CONCENTRATIONS VARY SIGNIFICANTLY. A RANGE OF 10-30 ug/mL MAY BE AN EFFECTIVE CONCENTRATION FOR MANY  PATIENTS. HOWEVER, SOME ARE BEST TREATED AT CONCENTRATIONS OUTSIDE THIS RANGE. ACETAMINOPHEN CONCENTRATIONS >150 ug/mL AT 4 HOURS AFTER INGESTION AND >50 ug/mL AT 12 HOURS AFTER INGESTION ARE OFTEN ASSOCIATED WITH TOXIC REACTIONS.   cbc     Status: None   Collection Time: 12/09/16  7:19 PM  Result Value Ref Range   WBC 8.9 3.6 - 11.0 K/uL   RBC 4.70 3.80 - 5.20 MIL/uL   Hemoglobin 14.8 12.0 - 16.0 g/dL   HCT 44.8 35.0 - 47.0 %   MCV 95.3 80.0 - 100.0 fL   MCH 31.5 26.0 - 34.0 pg   MCHC 33.1 32.0 - 36.0 g/dL   RDW 14.2 11.5 - 14.5 %   Platelets 275 150 - 440 K/uL  Urine Drug Screen, Qualitative     Status: Abnormal   Collection Time: 12/10/16  7:40 AM  Result Value Ref Range   Tricyclic, Ur Screen NONE DETECTED NONE DETECTED   Amphetamines, Ur Screen NONE DETECTED NONE DETECTED   MDMA (Ecstasy)Ur Screen NONE DETECTED NONE DETECTED   Cocaine Metabolite,Ur [place] NONE DETECTED NONE DETECTED   Opiate, Ur Screen NONE DETECTED NONE DETECTED   Phencyclidine (PCP) Ur S NONE DETECTED NONE DETECTED   Cannabinoid 50 Ng, Ur [place] NONE DETECTED NONE DETECTED   Barbiturates, Ur Screen NONE DETECTED NONE DETECTED   Benzodiazepine, Ur Scrn POSITIVE (A) NONE DETECTED   Methadone Scn, Ur NONE DETECTED NONE DETECTED    Comment: (NOTE) 213  Tricyclics, urine               Cutoff 1000 ng/mL 200  Amphetamines, urine             Cutoff 1000 ng/mL 300  MDMA (Ecstasy), urine           Cutoff 500 ng/mL 400  Cocaine Metabolite, urine  Cutoff 300 ng/mL 500  Opiate, urine                   Cutoff 300 ng/mL 600  Phencyclidine (PCP), urine      Cutoff 25 ng/mL 700  Cannabinoid, urine              Cutoff 50 ng/mL 800  Barbiturates, urine             Cutoff 200 ng/mL 900  Benzodiazepine, urine           Cutoff 200 ng/mL 1000 Methadone, urine                Cutoff 300 ng/mL 1100 1200 The urine drug screen provides only a preliminary, unconfirmed 1300 analytical test result and should not be used for  non-medical 1400 purposes. Clinical consideration and professional judgment should 1500 be applied to any positive drug screen result due to possible 1600 interfering substances. A more specific alternate chemical method 1700 must be used in order to obtain a confirmed analytical result.  1800 Gas chromato graphy / mass spectrometry (GC/MS) is the preferred 1900 confirmatory method.   Comprehensive metabolic panel     Status: Abnormal   Collection Time: 12/18/16  3:39 PM  Result Value Ref Range   Sodium 142 135 - 145 mmol/L   Potassium 4.0 3.5 - 5.1 mmol/L   Chloride 105 101 - 111 mmol/L   CO2 27 22 - 32 mmol/L   Glucose, Bld 104 (H) 65 - 99 mg/dL   BUN 20 6 - 20 mg/dL   Creatinine, Ser 0.68 0.44 - 1.00 mg/dL   Calcium 9.5 8.9 - 10.3 mg/dL   Total Protein 7.6 6.5 - 8.1 g/dL   Albumin 4.0 3.5 - 5.0 g/dL   AST 22 15 - 41 U/L   ALT 17 14 - 54 U/L   Alkaline Phosphatase 80 38 - 126 U/L   Total Bilirubin 0.8 0.3 - 1.2 mg/dL   GFR calc non Af Amer >60 >60 mL/min   GFR calc Af Amer >60 >60 mL/min    Comment: (NOTE) The eGFR has been calculated using the CKD EPI equation. This calculation has not been validated in all clinical situations. eGFR's persistently <60 mL/min signify possible Chronic Kidney Disease.    Anion gap 10 5 - 15  CBC with Differential     Status: None   Collection Time: 12/18/16  3:39 PM  Result Value Ref Range   WBC 9.5 3.6 - 11.0 K/uL   RBC 4.43 3.80 - 5.20 MIL/uL   Hemoglobin 14.0 12.0 - 16.0 g/dL   HCT 41.4 35.0 - 47.0 %   MCV 93.6 80.0 - 100.0 fL   MCH 31.6 26.0 - 34.0 pg   MCHC 33.8 32.0 - 36.0 g/dL   RDW 13.6 11.5 - 14.5 %   Platelets 261 150 - 440 K/uL   Neutrophils Relative % 65 %   Neutro Abs 6.2 1.4 - 6.5 K/uL   Lymphocytes Relative 26 %   Lymphs Abs 2.5 1.0 - 3.6 K/uL   Monocytes Relative 7 %   Monocytes Absolute 0.7 0.2 - 0.9 K/uL   Eosinophils Relative 1 %   Eosinophils Absolute 0.1 0 - 0.7 K/uL   Basophils Relative 1 %   Basophils  Absolute 0.1 0 - 0.1 K/uL  Urinalysis, Complete w Microscopic     Status: Abnormal   Collection Time: 12/18/16  3:39 PM  Result Value Ref Range   Color,  Urine YELLOW (A) YELLOW   APPearance CLOUDY (A) CLEAR   Specific Gravity, Urine 1.017 1.005 - 1.030   pH 6.0 5.0 - 8.0   Glucose, UA NEGATIVE NEGATIVE mg/dL   Hgb urine dipstick NEGATIVE NEGATIVE   Bilirubin Urine NEGATIVE NEGATIVE   Ketones, ur 20 (A) NEGATIVE mg/dL   Protein, ur 30 (A) NEGATIVE mg/dL   Nitrite NEGATIVE NEGATIVE   Leukocytes, UA LARGE (A) NEGATIVE   RBC / HPF 6-30 0 - 5 RBC/hpf   WBC, UA TOO NUMEROUS TO COUNT 0 - 5 WBC/hpf   Bacteria, UA NONE SEEN NONE SEEN   Squamous Epithelial / LPF 6-30 (A) NONE SEEN   Mucus PRESENT   CBC with Differential     Status: Abnormal   Collection Time: 01/04/17 12:28 PM  Result Value Ref Range   WBC 9.8 3.6 - 11.0 K/uL   RBC 4.20 3.80 - 5.20 MIL/uL   Hemoglobin 13.3 12.0 - 16.0 g/dL   HCT 39.2 35.0 - 47.0 %   MCV 93.4 80.0 - 100.0 fL   MCH 31.6 26.0 - 34.0 pg   MCHC 33.9 32.0 - 36.0 g/dL   RDW 14.1 11.5 - 14.5 %   Platelets 232 150 - 440 K/uL   Neutrophils Relative % 89 %   Neutro Abs 8.8 (H) 1.4 - 6.5 K/uL   Lymphocytes Relative 8 %   Lymphs Abs 0.7 (L) 1.0 - 3.6 K/uL   Monocytes Relative 3 %   Monocytes Absolute 0.3 0.2 - 0.9 K/uL   Eosinophils Relative 0 %   Eosinophils Absolute 0.0 0 - 0.7 K/uL   Basophils Relative 0 %   Basophils Absolute 0.0 0 - 0.1 K/uL  Comprehensive metabolic panel     Status: Abnormal   Collection Time: 01/04/17 12:28 PM  Result Value Ref Range   Sodium 140 135 - 145 mmol/L   Potassium 4.2 3.5 - 5.1 mmol/L   Chloride 104 101 - 111 mmol/L   CO2 27 22 - 32 mmol/L   Glucose, Bld 130 (H) 65 - 99 mg/dL   BUN 18 6 - 20 mg/dL   Creatinine, Ser 0.79 0.44 - 1.00 mg/dL   Calcium 8.6 (L) 8.9 - 10.3 mg/dL   Total Protein 5.8 (L) 6.5 - 8.1 g/dL   Albumin 3.1 (L) 3.5 - 5.0 g/dL   AST 23 15 - 41 U/L   ALT 34 14 - 54 U/L   Alkaline Phosphatase 80 38 -  126 U/L   Total Bilirubin 0.3 0.3 - 1.2 mg/dL   GFR calc non Af Amer >60 >60 mL/min   GFR calc Af Amer >60 >60 mL/min    Comment: (NOTE) The eGFR has been calculated using the CKD EPI equation. This calculation has not been validated in all clinical situations. eGFR's persistently <60 mL/min signify possible Chronic Kidney Disease.    Anion gap 9 5 - 15  Fibrin derivatives D-Dimer ([facility] only)     Status: None   Collection Time: 01/04/17 12:28 PM  Result Value Ref Range   Fibrin derivatives D-dimer ([facility]) 222.39 0.00 - 499.00 ng/mL (FEU)    Comment: (NOTE) <> Exclusion of Venous Thromboembolism (VTE) - OUTPATIENT ONLY   (Emergency Department or [place])   0-499 ng/ml (FEU): With a low to intermediate pretest probability                      for VTE this test result excludes the diagnosis  of VTE.   >499 ng/ml (FEU) : VTE not excluded; additional work up for VTE is                      required. <> Testing on Inpatients and Evaluation of Disseminated Intravascular   Coagulation (DIC) Reference Range:   0-499 ng/ml (FEU)       PHQ2/9: Depression screen [facility] 2/9 02/18/2017 01/17/2017 09/16/2016 06/14/2016 03/21/2016  Decreased Interest '3 1 1 3 3  ' Down, Depressed, Hopeless '3 1 1 3 3  ' PHQ - 2 Score '6 2 2 6 6  ' Altered sleeping '3 3 1 3 3  ' Tired, decreased energy - 3 1 - 3  Change in appetite '3 2 1 3 3  ' Feeling bad or failure about yourself  2 1 0 3 2  Trouble concentrating '2 1 1 1 2  ' Moving slowly or fidgety/restless 3 2 0 3 3  Suicidal thoughts 1 0 0 0 0  PHQ-9 Score '20 14 6 19 22  ' Difficult doing work/chores Extremely dIfficult Not difficult at all Somewhat difficult Very difficult Very difficult     Fall Risk: Fall Risk  02/18/2017 01/17/2017 12/30/2016 06/14/2016 03/21/2016  Falls in the past year? Yes No No Yes Yes  Number falls in past yr: 1 - - 1 2 or more  Injury with Fall? Yes - - Yes Yes    Assessment & Plan  1. Dyslipidemia  - atorvastatin  (LIPITOR) 40 MG tablet; TAKE 1 TABLET(40 MG) BY MOUTH DAILY  Dispense: 90 tablet; Refill: 1  2. CVA, old, hemiparesis ([place])  - atorvastatin (LIPITOR) 40 MG tablet; TAKE 1 TABLET(40 MG) BY MOUTH DAILY  Dispense: 90 tablet; Refill: 1  3. Chronic bronchitis, unspecified chronic bronchitis type ([place])  - Ipratropium-Albuterol (COMBIVENT RESPIMAT) 20-100 MCG/ACT AERS respimat; INHALE 1 PUFF BY MOUTH EVERY 6 HOURS. MUST LAST 3 MONTHS  Dispense: 1 Inhaler; Refill: 0  4. COPD, severe ([place])  - fluticasone furoate-vilanterol (BREO ELLIPTA) 100-25 MCG/INH AEPB; Inhale 1 puff into the lungs daily.  Dispense: 60 each; Refill: 5  5. Benign hypertension  - metoprolol succinate (TOPROL-XL) 25 MG 24 hr tablet; Take 1 tablet (25 mg total) by mouth daily.  Dispense: 90 tablet; Refill: 1  6. Chronic recurrent major depressive disorder ([place])  Needs to see psychiatrist sooner, very depressed today , discussed [facility] if worsening of symptoms prior to her next visit with psychiatrist  7. Senile purpura (H[facility])  stable  8. Mild protein-calorie malnutrition ([place])  Discussed protein shakes and also follow up with dentist.   9. Moderate benzodiazepine use disorder ([place])  Finally off BZD  10. Gait instability  - Ambulatory referral to [place]

## 2017-02-21 DIAGNOSIS — M797 Fibromyalgia: Secondary | ICD-10-CM | POA: Diagnosis not present

## 2017-02-21 DIAGNOSIS — M810 Age-related osteoporosis without current pathological fracture: Secondary | ICD-10-CM | POA: Diagnosis not present

## 2017-02-21 DIAGNOSIS — M25551 Pain in right hip: Secondary | ICD-10-CM | POA: Diagnosis not present

## 2017-02-21 DIAGNOSIS — E441 Mild protein-calorie malnutrition: Secondary | ICD-10-CM | POA: Diagnosis not present

## 2017-02-21 DIAGNOSIS — J449 Chronic obstructive pulmonary disease, unspecified: Secondary | ICD-10-CM | POA: Diagnosis not present

## 2017-02-21 DIAGNOSIS — I69354 Hemiplegia and hemiparesis following cerebral infarction affecting left non-dominant side: Secondary | ICD-10-CM | POA: Diagnosis not present

## 2017-02-21 DIAGNOSIS — G43909 Migraine, unspecified, not intractable, without status migrainosus: Secondary | ICD-10-CM | POA: Diagnosis not present

## 2017-02-21 DIAGNOSIS — M545 Low back pain: Secondary | ICD-10-CM | POA: Diagnosis not present

## 2017-02-21 DIAGNOSIS — I10 Essential (primary) hypertension: Secondary | ICD-10-CM | POA: Diagnosis not present

## 2017-02-21 DIAGNOSIS — G8929 Other chronic pain: Secondary | ICD-10-CM | POA: Diagnosis not present

## 2017-02-24 ENCOUNTER — Ambulatory Visit (HOSPITAL_BASED_OUTPATIENT_CLINIC_OR_DEPARTMENT_OTHER): Payer: Medicare HMO | Admitting: Student in an Organized Health Care Education/Training Program

## 2017-02-24 ENCOUNTER — Encounter: Payer: Self-pay | Admitting: Student in an Organized Health Care Education/Training Program

## 2017-02-24 ENCOUNTER — Ambulatory Visit
Admission: RE | Admit: 2017-02-24 | Discharge: 2017-02-24 | Disposition: A | Discharge status: Home / Self Care | Payer: Medicare HMO | Source: Ambulatory Visit | Attending: Student in an Organized Health Care Education/Training Program | Admitting: Student in an Organized Health Care Education/Training Program

## 2017-02-24 VITALS — BP 131/47 | HR 76 | Temp 97.8°F | Resp 16 | Ht 65.0 in | Wt 112.0 lb

## 2017-02-24 DIAGNOSIS — G894 Chronic pain syndrome: Secondary | ICD-10-CM | POA: Insufficient documentation

## 2017-02-24 DIAGNOSIS — M797 Fibromyalgia: Secondary | ICD-10-CM | POA: Diagnosis not present

## 2017-02-24 DIAGNOSIS — F17210 Nicotine dependence, cigarettes, uncomplicated: Secondary | ICD-10-CM | POA: Diagnosis not present

## 2017-02-24 DIAGNOSIS — M545 Low back pain, unspecified: Secondary | ICD-10-CM

## 2017-02-24 DIAGNOSIS — K141 Geographic tongue: Secondary | ICD-10-CM | POA: Diagnosis not present

## 2017-02-24 DIAGNOSIS — F339 Major depressive disorder, recurrent, unspecified: Secondary | ICD-10-CM | POA: Insufficient documentation

## 2017-02-24 DIAGNOSIS — I69359 Hemiplegia and hemiparesis following cerebral infarction affecting unspecified side: Secondary | ICD-10-CM | POA: Insufficient documentation

## 2017-02-24 DIAGNOSIS — G8929 Other chronic pain: Secondary | ICD-10-CM

## 2017-02-24 DIAGNOSIS — M5136 Other intervertebral disc degeneration, lumbar region: Secondary | ICD-10-CM | POA: Diagnosis not present

## 2017-02-24 DIAGNOSIS — K219 Gastro-esophageal reflux disease without esophagitis: Secondary | ICD-10-CM | POA: Insufficient documentation

## 2017-02-24 DIAGNOSIS — K589 Irritable bowel syndrome without diarrhea: Secondary | ICD-10-CM | POA: Diagnosis not present

## 2017-02-24 DIAGNOSIS — Z79899 Other long term (current) drug therapy: Secondary | ICD-10-CM | POA: Insufficient documentation

## 2017-02-24 DIAGNOSIS — B0229 Other postherpetic nervous system involvement: Secondary | ICD-10-CM

## 2017-02-24 DIAGNOSIS — J449 Chronic obstructive pulmonary disease, unspecified: Secondary | ICD-10-CM | POA: Diagnosis not present

## 2017-02-24 DIAGNOSIS — E785 Hyperlipidemia, unspecified: Secondary | ICD-10-CM | POA: Insufficient documentation

## 2017-02-24 DIAGNOSIS — M25551 Pain in right hip: Secondary | ICD-10-CM | POA: Diagnosis not present

## 2017-02-24 DIAGNOSIS — E559 Vitamin D deficiency, unspecified: Secondary | ICD-10-CM | POA: Diagnosis not present

## 2017-02-24 DIAGNOSIS — F411 Generalized anxiety disorder: Secondary | ICD-10-CM | POA: Diagnosis not present

## 2017-02-24 DIAGNOSIS — Z7982 Long term (current) use of aspirin: Secondary | ICD-10-CM | POA: Diagnosis not present

## 2017-02-24 DIAGNOSIS — M48061 Spinal stenosis, lumbar region without neurogenic claudication: Secondary | ICD-10-CM | POA: Insufficient documentation

## 2017-02-24 DIAGNOSIS — E441 Mild protein-calorie malnutrition: Secondary | ICD-10-CM | POA: Diagnosis not present

## 2017-02-24 DIAGNOSIS — M810 Age-related osteoporosis without current pathological fracture: Secondary | ICD-10-CM | POA: Diagnosis not present

## 2017-02-24 DIAGNOSIS — I10 Essential (primary) hypertension: Secondary | ICD-10-CM | POA: Insufficient documentation

## 2017-02-24 DIAGNOSIS — D692 Other nonthrombocytopenic purpura: Secondary | ICD-10-CM | POA: Insufficient documentation

## 2017-02-24 DIAGNOSIS — Z85828 Personal history of other malignant neoplasm of skin: Secondary | ICD-10-CM | POA: Diagnosis not present

## 2017-02-24 DIAGNOSIS — M4802 Spinal stenosis, cervical region: Secondary | ICD-10-CM | POA: Insufficient documentation

## 2017-02-24 DIAGNOSIS — M792 Neuralgia and neuritis, unspecified: Secondary | ICD-10-CM

## 2017-02-24 DIAGNOSIS — G43009 Migraine without aura, not intractable, without status migrainosus: Secondary | ICD-10-CM | POA: Insufficient documentation

## 2017-02-24 MED ORDER — DULOXETINE HCL 20 MG PO CPEP: 20.0000 mg | ORAL_CAPSULE | Freq: Every day | ORAL | 3 refills | Status: DC

## 2017-02-24 MED ORDER — GABAPENTIN 300 MG PO CAPS: 600.0000 mg | ORAL_CAPSULE | Freq: Four times a day (QID) | ORAL | 2 refills | Status: DC

## 2017-02-24 NOTE — Progress Notes (Signed)
Safety precautions to be maintained throughout the outpatient stay will include: orient to surroundings, keep bed in low position, maintain call bell within reach at all times, provide assistance with transfer out of bed and ambulation.  

## 2017-02-24 NOTE — Progress Notes (Signed)
Patient's Name: Beth [name]  MRN: [id number]  Referring Provider: Hubbard Hartshorn, FNP  DOB: 15-Jan-1946  PCP: Steele Sizer, MD  DOS: 02/24/2017  Note by: [name], MD  Service setting: Ambulatory outpatient  Specialty: Interventional Pain Management  Location: [facility] (AMB) Pain Management Facility  Visit type: Initial Patient Evaluation  Patient type: New Patient   Primary Reason(s) for Visit: Encounter for initial evaluation of one or more chronic problems (new to examiner) potentially causing chronic pain, and posing a threat to normal musculoskeletal function. (Level of risk: High) CC: Leg Pain (right,herpectic neuralgia); Back Pain (upper back mid); Hip Pain (right ); and Arm Pain (left )  HPI  [name] is a 72 y.o. year old, female patient, who comes today to see [place] for the first time for an initial evaluation of her chronic pain. She has Chronic LBP; Colon polyp; COPD, severe ([place]); CVA, old, hemiparesis ([place]); Dyslipidemia; Dysfunction of eustachian tube; Fibromyalgia syndrome; Gastro-esophageal reflux disease without esophagitis; Benign migrating glossitis; Cerebrovascular accident, old; H/O: pneumonia; Benign hypertension; IBS (irritable bowel syndrome); Low back pain with radiation; Chronic recurrent major depressive disorder ([place]); Dysmetabolic syndrome; Benign neoplasm of skin of trunk; OP (osteoporosis); Vitamin D deficiency; Marital problems; Migraine without aura and without status migrainosus, not intractable; Seasonal allergic rhinitis; Hyperglycemia; Senile purpura ([place]); Perennial allergic rhinitis with seasonal variation; Severe episode of recurrent major depressive disorder, without psychotic features ([place]); Moderate benzodiazepine use disorder ([place]); Major depressive disorder, recurrent, severe w/o psychotic behavior ([place]); Generalized anxiety disorder; Chronic neck and back pain; Protein-calorie malnutrition, mild ([place]); and Urinary tract infection on their problem list.  Today she comes in for evaluation of her Leg Pain (right,herpectic neuralgia); Back Pain (upper back mid); Hip Pain (right ); and Arm Pain (left )  Pain Assessment: Location: Right (S) Leg(see visit info for additional pain sites) Radiating: hip pain down the right leg and into the shin Onset: More than a month ago Duration: Chronic pain Quality: Throbbing, Stabbing, Constant, Discomfort Severity: 9 /10 (self-reported pain score)  Note: Reported level is inconsistent with clinical observations. Clinically the patient looks like a 3/10 A 3/10 is viewed as "Moderate" and described as significantly interfering with activities of daily living (ADL). It becomes difficult to feed, bathe, get dressed, get on and off the toilet or to perform personal hygiene functions. Difficult to get in and out of bed or a chair without assistance. Very distracting. With effort, it can be ignored when deeply involved in activities.       When using our objective Pain Scale, levels between 6 and 10/10 are said to belong in an emergency room, as it progressively worsens from a 6/10, described as severely limiting, requiring emergency care not usually available at an outpatient pain management facility. At a 6/10 level, communication becomes difficult and requires great effort. Assistance to reach the emergency department may be required. Facial flushing and profuse sweating along with potentially dangerous increases in heart rate and blood pressure will be evident. Effect on ADL: patient states she is unable to do "hardly anything" Timing: Constant Modifying factors: nothing   Onset and Duration: Sudden, Gradual and Date of onset: 10 years for back, 8 mos for leg , 2 months for arm Cause of pain: Unknown Severity: No change since onset and NAS-11 now: 9/10 Timing: During activity or exercise and After activity or exercise Aggravating Factors: Lifiting, Motion, Walking and Working Alleviating Factors: Cold packs and Lying  down Associated Problems: Depression, Dizziness, Fatigue, Numbness, Sadness,  Sweating, Weakness, Pain that wakes patient up and Pain that does not allow patient to sleep Quality of Pain: Aching, Agonizing, Fearful, Horrible, Pressure-like, Throbbing, Toothache-like, Uncomfortable and Work related Previous Examinations or Tests: Spinal tap, X-rays, Orthopedic evaluation and Psychiatric evaluation Previous Treatments: Epidural steroid injections and Narcotic medications  The patient comes into the clinics today for the first time for a chronic pain management evaluation.   72 year old female with a history of previous stroke, COPD, chronic low back pain along with right hip pain and right leg pain.  Patient does have a history of postherpetic neuralgia which she attributes to the burning and tingling sensation to in her right leg.  Patient also has chronic right hip pain secondary to hip osteoarthritis.  She also has a history of a prior stroke with right-sided weakness.  She does ambulate with a cane.  Patient does endorse symptoms of depression.  She has very low energy levels, has trouble sleeping, decreased appetite, feelings of hopelessness.  She was started on Lexapro by her psychiatrist at 5 mg which was not effective.  She is currently on gabapentin 300 mg 4 times a day which she does not find very effective.  She has tried opioid medications in the past including tramadol and hydrocodone.  She states that these medications were not very effective.  Today I took the time to provide the patient with information regarding my pain practice. The patient was informed that my practice is divided into two sections: an interventional pain management section, as well as a completely separate and distinct medication management section. I explained that I have procedure days for my interventional therapies, and evaluation days for follow-ups and medication management. Because of the amount of documentation  required during both, they are kept separated. This means that there is the possibility that she may be scheduled for a procedure on one day, and medication management the next. I have also informed her that because of staffing and facility limitations, I no longer take patients for medication management only. To illustrate the reasons for this, I gave the patient the example of surgeons, and how inappropriate it would be to refer a patient to his/her care, just to write for the post-surgical antibiotics on a surgery done by a different surgeon.   Because interventional pain management is my board-certified specialty, the patient was informed that joining my practice means that they are open to any and all interventional therapies. I made it clear that this does not mean that they will be forced to have any procedures done. What this means is that I believe interventional therapies to be essential part of the diagnosis and proper management of chronic pain conditions. Therefore, patients not interested in these interventional alternatives will be better served under the care of a different practitioner.  The patient was also made aware of my Comprehensive Pain Management Safety Guidelines where by joining my practice, they limit all of their nerve blocks and joint injections to those done by our practice, for as long as we are retained to manage their care.   Historic Controlled Substance Pharmacotherapy Review  PMP and historical list of controlled substances: Tramadol 50 mg, quantity 10, last fill 02/14/2017. MME/day: Less than 20 mg/day Medications: The patient did not bring the medication(s) to the appointment, as requested in our "New Patient Package" Pharmacodynamics: Desired effects: Analgesia: The patient reports >50% benefit. Reported improvement in function: The patient reports medication allows her to accomplish basic ADLs. Clinically meaningful improvement in function (  CMIF): Sustained CMIF  goals met Perceived effectiveness: Described as relatively effective, allowing for increase in activities of daily living (ADL) Undesirable effects: Side-effects or Adverse reactions: None reported Historical Monitoring: The patient  reports that she does not use drugs. List of all UDS Test(s): Lab Results  Component Value Date   MDMA NONE DETECTED 12/10/2016   MDMA NONE DETECTED 12/05/2016   MDMA NONE DETECTED 07/13/2016   COCAINSCRNUR NONE DETECTED 12/10/2016   COCAINSCRNUR NONE DETECTED 12/05/2016   COCAINSCRNUR NONE DETECTED 07/13/2016   PCPSCRNUR NONE DETECTED 12/10/2016   PCPSCRNUR NONE DETECTED 12/05/2016   PCPSCRNUR NONE DETECTED 07/13/2016   THCU NONE DETECTED 12/10/2016   THCU NONE DETECTED 12/05/2016   THCU NONE DETECTED 07/13/2016   ETH <10 12/09/2016   List of other Serum/Urine Drug Screening Test(s):  Lab Results  Component Value Date   COCAINSCRNUR NONE DETECTED 12/10/2016   COCAINSCRNUR NONE DETECTED 12/05/2016   COCAINSCRNUR NONE DETECTED 07/13/2016   THCU NONE DETECTED 12/10/2016   THCU NONE DETECTED 12/05/2016   THCU NONE DETECTED 07/13/2016   ETH <10 12/09/2016   Historical Background Evaluation: [place] PMP: Six (6) year initial data search conducted.             [place] Department of public safety, offender search: Editor, commissioning Information) Non-contributory Risk Assessment Profile: Aberrant behavior: None observed or detected today Risk factors for fatal opioid overdose: None identified today Fatal overdose hazard ratio (HR): Calculation deferred Non-fatal overdose hazard ratio (HR): Calculation deferred Risk of opioid abuse or dependence: 0.7-3.0% with doses ? 36 MME/day and 6.1-26% with doses ? 120 MME/day. Substance use disorder (SUD) risk level: Low Opioid risk tool (ORT) (Total Score): 5 Opioid Risk Tool - 02/24/17 1126      Family History of Substance Abuse   Alcohol  Negative    Illegal Drugs  Positive Female nephew died of an overdose   nephew died of  an overdose   Rx Drugs  Negative      Personal History of Substance Abuse   Alcohol  Negative    Illegal Drugs  Negative    Rx Drugs  Negative      Psychological Disease   Psychological Disease  Positive    OCD  Negative    Bipolar  Negative    Schizophrenia  Negative    Depression  Positive taking lexapro for anxiety/depression which states it is not working.  patient needs refill currently   taking lexapro for anxiety/depression which states it is not working.  patient needs refill currently     Total Score   Opioid Risk Tool Scoring  5    Opioid Risk Interpretation  Moderate Risk      ORT Scoring interpretation table:  Score <3 = Low Risk for SUD  Score between 4-7 = Moderate Risk for SUD  Score >8 = High Risk for Opioid Abuse   PHQ-2 Depression Scale:  Total score: 2  PHQ-2 Scoring interpretation table: (Score and probability of major depressive disorder)  Score 0 = No depression  Score 1 = 15.4% Probability  Score 2 = 21.1% Probability  Score 3 = 38.4% Probability  Score 4 = 45.5% Probability  Score 5 = 56.4% Probability  Score 6 = 78.6% Probability   PHQ-9 Depression Scale:  Total score: 8  PHQ-9 Scoring interpretation table:  Score 0-4 = No depression  Score 5-9 = Mild depression  Score 10-14 = Moderate depression  Score 15-19 = Moderately severe depression  Score 20-27 = Severe depression (  2.4 times higher risk of SUD and 2.89 times higher risk of overuse)   Pharmacologic Plan: As per protocol, I have not taken over any controlled substance management, pending the results of ordered tests and/or consults.            Initial impression: Pending review of available data and ordered tests.  Meds   Current Outpatient Medications:  .  acetaminophen (TYLENOL) 325 MG tablet, Take 650 mg by mouth every 4 (four) hours as needed., Disp: , Rfl:  .  ARIPiprazole (ABILIFY) 5 MG tablet, Take 5 mg by mouth daily., Disp: , Rfl:  .  aspirin 81 MG chewable tablet, Chew 81  mg daily by mouth., Disp: , Rfl:  .  atorvastatin (LIPITOR) 40 MG tablet, TAKE 1 TABLET(40 MG) BY MOUTH DAILY, Disp: 90 tablet, Rfl: 1 .  azelastine (ASTELIN) 0.1 % nasal spray, Place 1 spray into both nostrils 2 (two) times daily. Use in each nostril as directed, Disp: 30 mL, Rfl: 2 .  fluticasone (FLONASE) 50 MCG/ACT nasal spray, Place 2 sprays into both nostrils daily., Disp: 16 g, Rfl: 2 .  fluticasone furoate-vilanterol (BREO ELLIPTA) 100-25 MCG/INH AEPB, Inhale 1 puff into the lungs daily., Disp: 60 each, Rfl: 5 .  gabapentin (NEURONTIN) 300 MG capsule, Take 2 capsules (600 mg total) by mouth 4 (four) times daily., Disp: 240 capsule, Rfl: 2 .  Ipratropium-Albuterol (COMBIVENT RESPIMAT) 20-100 MCG/ACT AERS respimat, INHALE 1 PUFF BY MOUTH EVERY 6 HOURS. MUST LAST 3 MONTHS, Disp: 1 Inhaler, Rfl: 0 .  metoprolol succinate (TOPROL-XL) 25 MG 24 hr tablet, Take 1 tablet (25 mg total) by mouth daily., Disp: 90 tablet, Rfl: 1 .  Multiple Vitamin (THERA) TABS, Take by mouth., Disp: , Rfl:  .  DULoxetine (CYMBALTA) 20 MG capsule, Take 1 capsule (20 mg total) by mouth daily., Disp: 30 capsule, Rfl: 3  Imaging Review  Cervical Imaging: Cervical MR wo contrast:  Results for orders placed in visit on 09/19/02  MR Cervical Spine Wo Contrast   Narrative FINDINGS CLINICAL DATA:  NECK PAIN AND THORACIC PAIN.  LEFT LEG PAIN.  BILATERAL ARM PAIN. MRI CERVICAL SPINE WITHOUT CONTRAST PRIOR TO MRI FROM 09/12/01 HAS BEEN CHECKED OUT AND IS NOT AVAILABLE FOR REVIEW.  I DO HAVE THE REPORT FROM THAT STUDY. THE CERVICAL ALIGNMENT IS NORMAL.  THERE IS NO FRACTURE OR MASS.  THE CORD HAS A NORMAL SIGNAL. C2-3:  NEGATIVE. C3-4:  NEGATIVE. C4-5:  SMALL CENTRAL DISC HERNIATION.  THIS WAS DESCRIBED ON THE PRIOR REPORT. C5-6:  MILD DISC BULGING WITHOUT SPINAL STENOSIS. C6-7:  THERE IS A MODERATELY LARGE CENTRAL DISC HERNIATION WHICH IS SLIGHTLY INDENTING THE CORD. THE DISC HERNIATION EXTENDS TOWARD THE RIGHT NEURAL  FORAMEN WHERE THERE IS SOME UNCOVERTEBRAL SPURRING CONTRIBUTING TO RIGHT FORAMINAL STENOSIS. C7-T1:  NEGATIVE. IMPRESSION SMALL CENTRAL DISC HERNIATION AT C4-5. MODERATE CENTRAL DISC HERNIATION AT C6-7.  THERE IS SOME RIGHT FORAMINAL ENCROACHMENT AT C6-7 DUE TO DISC PROTRUSION AND SPURRING. MRI THORACIC SPINE WITHOUT CONTRAST PRIOR MRI FROM 07/16/01 HAS BEEN CHECKED OUT AND IS NOT AVAILABLE FOR REVIEW.  I DO HAVE THE REPORT OF THAT STUDY. THE THORACIC ALIGNMENT IS NORMAL.  THERE IS A MODERATE COMPRESSION FRACTURE OF THE SUPERIOR ASPECT OF T12.  THERE IS NORMAL FATTY BONE MARROW SIGNAL WITHIN THE T12 VERTEBRAL BODY DUE TO THE CHRONIC FRACTURE.  THERE IS RETROPULSION OF THE UPPER ASPECT OF T12 INTO THE CANAL WHICH IS EFFACING THE ANTERIOR SUBARACHNOID SPACE BUT NOT CAUSING ANY COMPRESSION OF THE  CONUS MEDULLARIS.  NO OTHER FRACTURES ARE IDENTIFIED. THE THORACIC CORD HAS A NORMAL SIGNAL.  ASIDE FROM THE T11-12 DISC WHERE THERE IS FRACTURE AND POSTERIOR SPURRING, THE REMAINDER OF THE DISCS APPEAR HEALTHY.  THERE IS NO DISC HERNIATION. IMPRESSION CHRONIC COMPRESSION FRACTURE T12.  THERE IS POSTERIOR SPURRING AT THIS LEVEL WITHOUT ANY DEFORMITY OF THE CORD. NEGATIVE FOR [place]. MRI LUMBAR SPINE WITHOUT CONTRAST NO COMPARISON. THE LUMBAR ALIGNMENT IS NORMAL.  CHRONIC COMPRESSION FRACTURE OF T12 IS NOTED.  THIS IS DESCRIBED ABOVE.  THERE IS SOME POSTERIOR SPURRING AT T11-12 WHICH IS EFFACING THE ANTERIOR SUBARACHNOID SPACE BUT THERE IS NO COMPRESSION OF THE CONUS MEDULLARIS.  NO OTHER FRACTURES ARE IDENTIFIED. L1-2:  NEGATIVE. L2-3:  NEGATIVE. L3-4:  MILD DISC BULGING AND MILD FACET ARTHROPATHY. L4-5:  MILD DISC BULGING AND MILD TO MODERATE FACET ARTHROPATHY.  THERE IS MILD SPINAL STENOSIS. L5-S1:  MINIMAL DISC BULGING AND MILD FACET ARTHROPATHY. IMPRESSION CHRONIC BENIGN COMPRESSION FRACTURE OF T12.  NO ACUTE FRACTURE IS IDENTIFIED. MILD SPINAL STENOSIS AT L4-5 DUE TO DISC BULGING  AND FACET ARTHROPATHY.   Cervical CT wo contrast:  Results for orders placed during the hospital encounter of 02/09/16  CT Cervical Spine Wo Contrast   Narrative CLINICAL DATA:  72 year old female with history of trauma from multiple falls over the past week with injury to the head. One fall 2 days ago resulted in loss of consciousness. Some associated nausea, vomiting and dizziness. Patient is on blood thinners. Neck pain.  EXAM: CT HEAD WITHOUT CONTRAST  CT CERVICAL SPINE WITHOUT CONTRAST  TECHNIQUE: Multidetector CT imaging of the head and cervical spine was performed following the standard protocol without intravenous contrast. Multiplanar CT image reconstructions of the cervical spine were also generated.  COMPARISON:  Head CT 12/17/2012.  FINDINGS: CT HEAD FINDINGS  Brain: Mild cerebral atrophy. Patchy and confluent areas of decreased attenuation are noted throughout the deep and periventricular white matter of the cerebral hemispheres bilaterally, compatible with chronic microvascular ischemic disease. Old lacunar infarct in the body of the right caudate nucleus is unchanged. No evidence of acute infarction, hemorrhage, hydrocephalus, extra-axial collection or mass lesion/mass effect.  Vascular: No hyperdense vessel or unexpected calcification.  Skull: Normal. Negative for fracture or focal lesion.  Sinuses/Orbits: No acute finding.  Other: None.  CT CERVICAL SPINE FINDINGS  Alignment: Normal.  Skull base and vertebrae: Postoperative changes of ACDF are noted from C5-C7 with incorporated interbody grafts at C5-C6 and C6-C7. No acute fracture. No primary bone lesion or focal pathologic process.  Soft tissues and spinal canal: No prevertebral fluid or swelling. No visible canal hematoma.  Disc levels: Mild multilevel degenerative disc disease and mild multilevel facet arthropathy.  Upper chest: Mild emphysematous changes are noted in the  lung apices.  Other: None.  IMPRESSION: 1. No evidence of significant acute traumatic injury to the skull, brain or cervical spine. 2. Mild cerebral atrophy with mild chronic microvascular ischemic changes in the cerebral white matter redemonstrated, as above. 3. Postoperative changes of the ACDF at C5-C7, without acute complications.   Electronically Signed   By: Vinnie Langton M.D.   On: 02/09/2016 14:05      Thoracic Imaging: Thoracic MR wo contrast:  Results for orders placed in visit on 09/19/02  MR Thoracic Spine Wo Contrast   Narrative FINDINGS CLINICAL DATA:  NECK PAIN AND THORACIC PAIN.  LEFT LEG PAIN.  BILATERAL ARM PAIN. MRI CERVICAL SPINE WITHOUT CONTRAST PRIOR TO MRI FROM 09/12/01 HAS BEEN CHECKED OUT AND  IS NOT AVAILABLE FOR REVIEW.  I DO HAVE THE REPORT FROM THAT STUDY. THE CERVICAL ALIGNMENT IS NORMAL.  THERE IS NO FRACTURE OR MASS.  THE CORD HAS A NORMAL SIGNAL. C2-3:  NEGATIVE. C3-4:  NEGATIVE. C4-5:  SMALL CENTRAL DISC HERNIATION.  THIS WAS DESCRIBED ON THE PRIOR REPORT. C5-6:  MILD DISC BULGING WITHOUT SPINAL STENOSIS. C6-7:  THERE IS A MODERATELY LARGE CENTRAL DISC HERNIATION WHICH IS SLIGHTLY INDENTING THE CORD. THE DISC HERNIATION EXTENDS TOWARD THE RIGHT NEURAL FORAMEN WHERE THERE IS SOME UNCOVERTEBRAL SPURRING CONTRIBUTING TO RIGHT FORAMINAL STENOSIS. C7-T1:  NEGATIVE. IMPRESSION SMALL CENTRAL DISC HERNIATION AT C4-5. MODERATE CENTRAL DISC HERNIATION AT C6-7.  THERE IS SOME RIGHT FORAMINAL ENCROACHMENT AT C6-7 DUE TO DISC PROTRUSION AND SPURRING. MRI THORACIC SPINE WITHOUT CONTRAST PRIOR MRI FROM 07/16/01 HAS BEEN CHECKED OUT AND IS NOT AVAILABLE FOR REVIEW.  I DO HAVE THE REPORT OF THAT STUDY. THE THORACIC ALIGNMENT IS NORMAL.  THERE IS A MODERATE COMPRESSION FRACTURE OF THE SUPERIOR ASPECT OF T12.  THERE IS NORMAL FATTY BONE MARROW SIGNAL WITHIN THE T12 VERTEBRAL BODY DUE TO THE CHRONIC FRACTURE.  THERE IS RETROPULSION OF THE UPPER ASPECT  OF T12 INTO THE CANAL WHICH IS EFFACING THE ANTERIOR SUBARACHNOID SPACE BUT NOT CAUSING ANY COMPRESSION OF THE CONUS MEDULLARIS.  NO OTHER FRACTURES ARE IDENTIFIED. THE THORACIC CORD HAS A NORMAL SIGNAL.  ASIDE FROM THE T11-12 DISC WHERE THERE IS FRACTURE AND POSTERIOR SPURRING, THE REMAINDER OF THE DISCS APPEAR HEALTHY.  THERE IS NO DISC HERNIATION. IMPRESSION CHRONIC COMPRESSION FRACTURE T12.  THERE IS POSTERIOR SPURRING AT THIS LEVEL WITHOUT ANY DEFORMITY OF THE CORD. NEGATIVE FOR [place]. MRI LUMBAR SPINE WITHOUT CONTRAST NO COMPARISON. THE LUMBAR ALIGNMENT IS NORMAL.  CHRONIC COMPRESSION FRACTURE OF T12 IS NOTED.  THIS IS DESCRIBED ABOVE.  THERE IS SOME POSTERIOR SPURRING AT T11-12 WHICH IS EFFACING THE ANTERIOR SUBARACHNOID SPACE BUT THERE IS NO COMPRESSION OF THE CONUS MEDULLARIS.  NO OTHER FRACTURES ARE IDENTIFIED. L1-2:  NEGATIVE. L2-3:  NEGATIVE. L3-4:  MILD DISC BULGING AND MILD FACET ARTHROPATHY. L4-5:  MILD DISC BULGING AND MILD TO MODERATE FACET ARTHROPATHY.  THERE IS MILD SPINAL STENOSIS. L5-S1:  MINIMAL DISC BULGING AND MILD FACET ARTHROPATHY. IMPRESSION CHRONIC BENIGN COMPRESSION FRACTURE OF T12.  NO ACUTE FRACTURE IS IDENTIFIED. MILD SPINAL STENOSIS AT L4-5 DUE TO DISC BULGING AND FACET ARTHROPATHY.    Results for orders placed in visit on 07/16/01  MR Thoracic Spine W Wo Contrast   Narrative FINDINGS CLINICAL DATA:  BACK PAIN WITH PAIN IN BOTH ARMS. MRI OF THE CERVICAL SPINE WITHOUT CONTRAST MULTIPLANAR T1- AND T2-WEIGHTED IMAGING WAS PERFORMED. THE ALIGNMENT OF THE SPINE IS NORMAL. THERE IS NO ABNORMALITY OF THE FORAMEN [place], C1-2, C2-3, OR C3-4.  AT C4-5, THERE IS A TINY CENTRAL PROTRUSION BUT NO SIGNIFICANT HERNIATION OR ANY NARROWING OF THE CANAL OR NEURAL FORAMINA. AT C5-6, THERE IS MINIMAL DISC BULGE BUT NO HNP OR STENOSIS.  THE CANAL AND FORAMINA ARE SUFFICIENTLY PATENT. AT C6-7, THERE IS A MODERATE SIZED BROAD BASED BUT CENTRALLY PREDOMINANT  DISC HERNIATION.  THIS EFFACES THE VENTRAL SUBARACHNOID SPACE AND SLIGHTLY INDENTS THE VENTRAL ASPECT OF THE CORD.  AMPLE SUBARACHNOID SPACE IS PRESENT DORSAL TO THE CORD.  NO NEURAL FORAMINAL EXTENSION IS SEEN. AT C7-T1, THERE IS NO ABNORMALITY. IMPRESSION MODERATE SIZED BROAD BASED BUT CENTRALLY PREDOMINANT [place] HERNIATION AT C6-7 WHICH EFFACES THE VENTRAL SUBARACHNOID SPACE AND INDENTS THE VENTRAL ASPECT OF THE CORD. TINY CENTRAL PROTRUSION AT C4-5. DISC BULGE AT  C5-6. MRI OF THE THORACIC SPINE WITH AND WITHOUT CONTRAST MULTIPLANAR T1- AND T2-WEIGHTED IMAGING WAS PERFORMED INCLUDING IMAGING AFTER THE INTRAVENOUS ADMINISTRATION OF 15 CC OF OMNISCAN. THERE IS NO ABNORMALITY FROM T-1 THROUGH T-11.  THE VERTEBRAL BODIES ARE NORMAL AND THE DISCS ARE NORMAL. THE PATIENT HAS A SUBACUTE COMPRESSION FRACTURE AT T-12 WITH LOSS OF HEIGHT OF ABOUT 50 PERCENT. THERE IS SLIGHT BONY RETROPULSION AT THE POSTERIOR SUPERIOR VERTEBRAL BODY.  NO TRAUMATIC DISC HERNIATION IS SEEN.  THIS INDENTS THE VENTRAL SUBARACHNOID SPACE BUT DOES NOT APPEAR TO COMPRESS OR DEFORM THE DISTAL THORACIC CORD.  THERE IS AMPLE SUBARACHNOID SPACE DORSAL TO THE CORD. THE APPEARANCE IS THAT OF A BENIGN OSTEOPOROTIC FRACTURE. IMPRESSION 1.  ACUTE OR SUBACUTE BENIGN OSTEOPOROTIC COMPRESSION FRACTURE OF T-12 WITH LOSS OF HEIGHT OF UP TO 50 PERCENT.  SLIGHT BONY RETROPULSION BUT WITHOUT GROSSLY SIGNIFICANT COMPROMISE OF THE CANAL OR ANY EFFECT UPON THE DISTAL THORACIC CORD.     Lumbosacral Imaging: Lumbar MR wo contrast:  Results for orders placed in visit on 09/19/02  MR Lumbar Spine Wo Contrast   Narrative FINDINGS CLINICAL DATA:  NECK PAIN AND THORACIC PAIN.  LEFT LEG PAIN.  BILATERAL ARM PAIN. MRI CERVICAL SPINE WITHOUT CONTRAST PRIOR TO MRI FROM 09/12/01 HAS BEEN CHECKED OUT AND IS NOT AVAILABLE FOR REVIEW.  I DO HAVE THE REPORT FROM THAT STUDY. THE CERVICAL ALIGNMENT IS NORMAL.  THERE IS NO FRACTURE OR MASS.  THE CORD  HAS A NORMAL SIGNAL. C2-3:  NEGATIVE. C3-4:  NEGATIVE. C4-5:  SMALL CENTRAL DISC HERNIATION.  THIS WAS DESCRIBED ON THE PRIOR REPORT. C5-6:  MILD DISC BULGING WITHOUT SPINAL STENOSIS. C6-7:  THERE IS A MODERATELY LARGE CENTRAL DISC HERNIATION WHICH IS SLIGHTLY INDENTING THE CORD. THE DISC HERNIATION EXTENDS TOWARD THE RIGHT NEURAL FORAMEN WHERE THERE IS SOME UNCOVERTEBRAL SPURRING CONTRIBUTING TO RIGHT FORAMINAL STENOSIS. C7-T1:  NEGATIVE. IMPRESSION SMALL CENTRAL DISC HERNIATION AT C4-5. MODERATE CENTRAL DISC HERNIATION AT C6-7.  THERE IS SOME RIGHT FORAMINAL ENCROACHMENT AT C6-7 DUE TO DISC PROTRUSION AND SPURRING. MRI THORACIC SPINE WITHOUT CONTRAST PRIOR MRI FROM 07/16/01 HAS BEEN CHECKED OUT AND IS NOT AVAILABLE FOR REVIEW.  I DO HAVE THE REPORT OF THAT STUDY. THE THORACIC ALIGNMENT IS NORMAL.  THERE IS A MODERATE COMPRESSION FRACTURE OF THE SUPERIOR ASPECT OF T12.  THERE IS NORMAL FATTY BONE MARROW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

## 2017-02-24 NOTE — Patient Instructions (Signed)
1. UDS 2. Stop Lexapro. Start Cymbalta at 20 mg daily 3. Increase Gabapentin to 600 mg four times a day 4. Xrays of lumbar spine and Si joints 5. Follow up in 3 weeks

## 2017-02-25 DIAGNOSIS — I10 Essential (primary) hypertension: Secondary | ICD-10-CM | POA: Diagnosis not present

## 2017-02-25 DIAGNOSIS — G43909 Migraine, unspecified, not intractable, without status migrainosus: Secondary | ICD-10-CM | POA: Diagnosis not present

## 2017-02-25 DIAGNOSIS — I69354 Hemiplegia and hemiparesis following cerebral infarction affecting left non-dominant side: Secondary | ICD-10-CM | POA: Diagnosis not present

## 2017-02-25 DIAGNOSIS — M25551 Pain in right hip: Secondary | ICD-10-CM | POA: Diagnosis not present

## 2017-02-25 DIAGNOSIS — E441 Mild protein-calorie malnutrition: Secondary | ICD-10-CM | POA: Diagnosis not present

## 2017-02-25 DIAGNOSIS — J449 Chronic obstructive pulmonary disease, unspecified: Secondary | ICD-10-CM | POA: Diagnosis not present

## 2017-02-25 DIAGNOSIS — G8929 Other chronic pain: Secondary | ICD-10-CM | POA: Diagnosis not present

## 2017-02-25 DIAGNOSIS — M797 Fibromyalgia: Secondary | ICD-10-CM | POA: Diagnosis not present

## 2017-02-25 DIAGNOSIS — M545 Low back pain: Secondary | ICD-10-CM | POA: Diagnosis not present

## 2017-02-25 DIAGNOSIS — M810 Age-related osteoporosis without current pathological fracture: Secondary | ICD-10-CM | POA: Diagnosis not present

## 2017-02-26 DIAGNOSIS — M25551 Pain in right hip: Secondary | ICD-10-CM | POA: Diagnosis not present

## 2017-02-26 DIAGNOSIS — G8929 Other chronic pain: Secondary | ICD-10-CM | POA: Diagnosis not present

## 2017-02-26 DIAGNOSIS — I10 Essential (primary) hypertension: Secondary | ICD-10-CM | POA: Diagnosis not present

## 2017-02-26 DIAGNOSIS — M797 Fibromyalgia: Secondary | ICD-10-CM | POA: Diagnosis not present

## 2017-02-26 DIAGNOSIS — M810 Age-related osteoporosis without current pathological fracture: Secondary | ICD-10-CM | POA: Diagnosis not present

## 2017-02-26 DIAGNOSIS — I69354 Hemiplegia and hemiparesis following cerebral infarction affecting left non-dominant side: Secondary | ICD-10-CM | POA: Diagnosis not present

## 2017-02-26 DIAGNOSIS — J449 Chronic obstructive pulmonary disease, unspecified: Secondary | ICD-10-CM | POA: Diagnosis not present

## 2017-02-26 DIAGNOSIS — G43909 Migraine, unspecified, not intractable, without status migrainosus: Secondary | ICD-10-CM | POA: Diagnosis not present

## 2017-02-26 DIAGNOSIS — E441 Mild protein-calorie malnutrition: Secondary | ICD-10-CM | POA: Diagnosis not present

## 2017-02-26 DIAGNOSIS — M545 Low back pain: Secondary | ICD-10-CM | POA: Diagnosis not present

## 2017-03-01 LAB — COMPLIANCE DRUG ANALYSIS, UR

## 2017-03-05 ENCOUNTER — Ambulatory Visit: Payer: Self-pay

## 2017-03-05 DIAGNOSIS — M25551 Pain in right hip: Secondary | ICD-10-CM | POA: Diagnosis not present

## 2017-03-05 DIAGNOSIS — J449 Chronic obstructive pulmonary disease, unspecified: Secondary | ICD-10-CM | POA: Diagnosis not present

## 2017-03-05 DIAGNOSIS — G8929 Other chronic pain: Secondary | ICD-10-CM | POA: Diagnosis not present

## 2017-03-05 DIAGNOSIS — G43909 Migraine, unspecified, not intractable, without status migrainosus: Secondary | ICD-10-CM | POA: Diagnosis not present

## 2017-03-05 DIAGNOSIS — E441 Mild protein-calorie malnutrition: Secondary | ICD-10-CM | POA: Diagnosis not present

## 2017-03-05 DIAGNOSIS — I69354 Hemiplegia and hemiparesis following cerebral infarction affecting left non-dominant side: Secondary | ICD-10-CM | POA: Diagnosis not present

## 2017-03-05 DIAGNOSIS — I10 Essential (primary) hypertension: Secondary | ICD-10-CM | POA: Diagnosis not present

## 2017-03-05 DIAGNOSIS — M810 Age-related osteoporosis without current pathological fracture: Secondary | ICD-10-CM | POA: Diagnosis not present

## 2017-03-05 DIAGNOSIS — M545 Low back pain: Secondary | ICD-10-CM | POA: Diagnosis not present

## 2017-03-05 DIAGNOSIS — M797 Fibromyalgia: Secondary | ICD-10-CM | POA: Diagnosis not present

## 2017-03-05 NOTE — Telephone Encounter (Signed)
Patient called in with c/o "dizziness." She said "I feel lightheaded and this has been going on about a month." I asked does she feel like the room is tilting or spinning, she said "no." I asked how bad is the dizziness, she said "I'm weak and it interferes with normal activities." I asked if anything makes it worse and if she has any other symptoms, she said "nothing makes it worse. I've been coughing for about 2 weeks, coughing up white sputum. I don't think I have a fever. My chest hurt sometimes." According to protocol, see PCP within 24 hours, appointment made for tomorrow with Raelyn Ensign, FNP, care advice given, she verbalized understanding.  Reason for Disposition . [1] MODERATE dizziness (e.g., interferes with normal activities) AND [2] has NOT been evaluated by physician for this  (Exception: dizziness caused by heat exposure, sudden standing, or poor fluid intake)  Answer Assessment - Initial Assessment Questions 1. DESCRIPTION: "Describe your dizziness."     Lightheadedness 2. LIGHTHEADED: "Do you feel lightheaded?" (e.g., somewhat faint, woozy, weak upon standing)     Weak 3. VERTIGO: "Do you feel like either you or the room is spinning or tilting?" (i.e. vertigo)     No 4. SEVERITY: "How bad is it?"  "Do you feel like you are going to faint?" "Can you stand and walk?"   - MILD - walking normally   - MODERATE - interferes with normal activities (e.g., work, school)    - SEVERE - unable to stand, requires support to walk, feels like passing out now.      Moderate 5. ONSET:  "When did the dizziness begin?"     1 month ago 6. AGGRAVATING FACTORS: "Does anything make it worse?" (e.g., standing, change in head position)     Standing 7. HEART RATE: "Can you tell me your heart rate?" "How many beats in 15 seconds?"  (Note: not all patients can do this)       69 8. CAUSE: "What do you think is causing the dizziness?"     No 9. RECURRENT SYMPTOM: "Have you had dizziness before?" If so,  ask: "When was the last time?" "What happened that time?"     No 10. OTHER SYMPTOMS: "Do you have any other symptoms?" (e.g., fever, chest pain, vomiting, diarrhea, bleeding)       Coughing x 2 weeks with congestion with white sputum, chest pain off an on 11. PREGNANCY: "Is there any chance you are pregnant?" "When was your last menstrual period?"       No  Protocols used: DIZZINESS [facility]

## 2017-03-06 ENCOUNTER — Ambulatory Visit (INDEPENDENT_AMBULATORY_CARE_PROVIDER_SITE_OTHER): Payer: Medicare HMO | Admitting: Family Medicine

## 2017-03-06 ENCOUNTER — Encounter: Payer: Self-pay | Admitting: Family Medicine

## 2017-03-06 VITALS — BP 130/72 | HR 88 | Temp 97.8°F | Resp 16 | Ht 65.0 in | Wt 116.6 lb

## 2017-03-06 DIAGNOSIS — Z720 Tobacco use: Secondary | ICD-10-CM

## 2017-03-06 DIAGNOSIS — J441 Chronic obstructive pulmonary disease with (acute) exacerbation: Secondary | ICD-10-CM | POA: Diagnosis not present

## 2017-03-06 MED ORDER — DOXYCYCLINE HYCLATE 100 MG PO TABS: 100.0000 mg | ORAL_TABLET | Freq: Two times a day (BID) | ORAL | 0 refills | Status: AC

## 2017-03-06 MED ORDER — PREDNISONE 20 MG PO TABS: 20.0000 mg | ORAL_TABLET | Freq: Two times a day (BID) | ORAL | 0 refills | Status: AC

## 2017-03-06 NOTE — Patient Instructions (Addendum)
Drink plenty of water throughout the day to stay hydrated and help loosen up mucus in your chest.  - If you develop a fever, chest pain, or shortness of breath that is not relieved by your combivent (may use every 6 hours as needed), or worsening lightheadedness, you must go to the ER for further evaluation.  Chronic Obstructive Pulmonary Disease Exacerbation Chronic obstructive pulmonary disease (COPD) is a common lung problem. In COPD, the flow of air from the lungs is limited. COPD exacerbations are times that breathing gets worse and you need extra treatment. Without treatment they can be life threatening. If they happen often, your lungs can become more damaged. If your COPD gets worse, your doctor may treat you with:  Medicines.  Oxygen.  Different ways to clear your airway, such as using a mask.  Follow these instructions at home:  Do not smoke.  Avoid tobacco smoke and other things that bother your lungs.  If given, take your antibiotic medicine as told. Finish the medicine even if you start to feel better.  Only take medicines as told by your doctor.  Drink enough fluids to keep your pee (urine) clear or pale yellow (unless your doctor has told you not to).  Use a cool mist machine (vaporizer).  If you use oxygen or a machine that turns liquid medicine into a mist (nebulizer), continue to use them as told.  Keep up with shots (vaccinations) as told by your doctor.  Exercise regularly.  Eat healthy foods.  Keep all doctor visits as told. Get help right away if:  You are very short of breath and it gets worse.  You have trouble talking.  You have bad chest pain.  You have blood in your spit (sputum).  You have a fever.  You keep throwing up (vomiting).  You feel weak, or you pass out (faint).  You feel confused.  You keep getting worse. This information is not intended to replace advice given to you by your health care provider. Make sure you discuss any  questions you have with your health care provider. Document Released: 01/03/2011 Document Revised: 06/22/2015 Document Reviewed: 09/18/2012 Elsevier Interactive Patient Education  2017 [employer].

## 2017-03-06 NOTE — Progress Notes (Signed)
Name: Beth [name]   MRN: [id number]    DOB: [birth year]   Date:03/06/2017       Progress Note  Subjective  Chief Complaint  Chief Complaint  Patient presents with  . URI    cough, congested for 2 weeks  . Dizziness    HPI  Pt presents with concern for 2-3 weeks of productive cough (white phlegm), chest congestion, intermittent lightheadedness when she goes from sitting to standing without near syncope or syncope, rhinorrhea, endorses occasional chest discomfort, has COPD and reports shortness of breath with exertion.  Denies: Abdominal pain, NVD or constipation, confusion, abnormal speech, vision changes; she has baseline RLE "nerve pain", and history CVA with LEFT hemiparesis - symptoms have remained stable with no acute changes.  She has not taken any medication to help her symptoms.  She has COPD and still smokes 1/2ppd.  Discussed the need for cessation and the increased risk COPD poses for complex lung infections.  Pt not ready to quit, but does state that she has cut back significantly.  Breo daily - says she has been compliant.  Also notes using Combivent about 1 time per day for the last 2 weeks - this has been helping "a little" with her symptoms.  Patient Active Problem List   Diagnosis Date Noted  . Urinary tract infection 12/05/2016  . Protein-calorie malnutrition, mild ([place]) 08/07/2016  . Generalized anxiety disorder 07/15/2016  . Chronic neck and back pain 07/15/2016  . Severe episode of recurrent major depressive disorder, without psychotic features ([place]) 07/14/2016  . Moderate benzodiazepine use disorder ([place]) 05/08/2016  . Major depressive disorder, recurrent, severe w/o psychotic behavior ([place]) 05/01/2016  . Seasonal allergic rhinitis 03/23/2015  . Hyperglycemia 03/23/2015  . Senile purpura ([place]) 03/23/2015  . Perennial allergic rhinitis with seasonal variation 03/23/2015  . Marital problems 10/31/2014  . Migraine without aura and without status migrainosus, not  intractable 10/31/2014  . Chronic LBP 08/09/2014  . Colon polyp 08/09/2014  . COPD, severe ([place]) 08/09/2014  . CVA, old, hemiparesis ([place]) 08/09/2014  . Dyslipidemia 08/09/2014  . Dysfunction of eustachian tube 08/09/2014  . Fibromyalgia syndrome 08/09/2014  . Gastro-esophageal reflux disease without esophagitis 08/09/2014  . Benign migrating glossitis 08/09/2014  . Cerebrovascular accident, old 08/09/2014  . IBS (irritable bowel syndrome) 08/09/2014  . Low back pain with radiation 08/09/2014  . Chronic recurrent major depressive disorder ([place]) 08/09/2014  . Dysmetabolic syndrome 55/73/2202  . OP (osteoporosis) 08/09/2014  . Vitamin D deficiency 08/09/2014  . Benign hypertension 07/19/2013  . Benign neoplasm of skin of trunk 06/03/2013  . H/O: pneumonia 09/25/2012    Social History   Tobacco Use  . Smoking status: Current Some Day Smoker    Packs/day: 1.00    Years: 35.00    Pack years: 35.00    Types: Cigarettes  . Smokeless tobacco: Never Used  . Tobacco comment: Restarted at 1/2 a pack a day.   Substance Use Topics  . Alcohol use: No    Alcohol/week: 0.0 oz     Current Outpatient Medications:  .  acetaminophen (TYLENOL) 325 MG tablet, Take 650 mg by mouth every 4 (four) hours as needed., Disp: , Rfl:  .  ARIPiprazole (ABILIFY) 5 MG tablet, Take 5 mg by mouth daily., Disp: , Rfl:  .  aspirin 81 MG chewable tablet, Chew 81 mg daily by mouth., Disp: , Rfl:  .  atorvastatin (LIPITOR) 40 MG tablet, TAKE 1 TABLET(40 MG) BY MOUTH DAILY, Disp: 90 tablet, Rfl: 1 .  azelastine (ASTELIN) 0.1 % nasal spray, Place 1 spray into both nostrils 2 (two) times daily. Use in each nostril as directed, Disp: 30 mL, Rfl: 2 .  DULoxetine (CYMBALTA) 20 MG capsule, Take 1 capsule (20 mg total) by mouth daily., Disp: 30 capsule, Rfl: 3 .  fluticasone (FLONASE) 50 MCG/ACT nasal spray, Place 2 sprays into both nostrils daily., Disp: 16 g, Rfl: 2 .  fluticasone furoate-vilanterol (BREO ELLIPTA)  100-25 MCG/INH AEPB, Inhale 1 puff into the lungs daily., Disp: 60 each, Rfl: 5 .  gabapentin (NEURONTIN) 300 MG capsule, Take 2 capsules (600 mg total) by mouth 4 (four) times daily., Disp: 240 capsule, Rfl: 2 .  Ipratropium-Albuterol (COMBIVENT RESPIMAT) 20-100 MCG/ACT AERS respimat, INHALE 1 PUFF BY MOUTH EVERY 6 HOURS. MUST LAST 3 MONTHS, Disp: 1 Inhaler, Rfl: 0 .  metoprolol succinate (TOPROL-XL) 25 MG 24 hr tablet, Take 1 tablet (25 mg total) by mouth daily., Disp: 90 tablet, Rfl: 1 .  Multiple Vitamin (THERA) TABS, Take by mouth., Disp: , Rfl:   Allergies  Allergen Reactions  . Bextra  [Valdecoxib]   . Compazine  [Prochlorperazine Edisylate]   . Lithium Carbonate   . Lyrica [Pregabalin]     ROS  Ten systems reviewed and is negative except as mentioned in HPI  Objective  Vitals:   03/06/17 1402  BP: 130/72  Pulse: 88  Resp: 16  Temp: 97.8 F (36.6 C)  TempSrc: Oral  SpO2: 92%  Weight: 116 lb 9.6 oz (52.9 kg)  Height: 5\' 5"  (1.651 m)   Body mass index is 19.4 kg/m.  Nursing Note and Vital Signs reviewed.  Physical Exam  Constitutional: Patient appears well-developed and well-nourished. Thin.  No distress.  HEENT: head atraumatic, normocephalic, pupils equal and reactive to light, EOM's intact, TM's without erythema or bulging, no maxillary or frontal sinus tenderness, neck supple without lymphadenopathy, oropharynx pink and moist without exudate Cardiovascular: Normal rate, regular rhythm, S1/S2 present.  No murmur or rub heard. No BLE edema. Pulmonary/Chest: Effort normal and breath sounds clear but diminished throughout. No respiratory distress or retractions. Psychiatric: Patient has a flat affect. behavior is appropriate. Judgment and thought content appropriate. Neurological: she is alert and oriented to person, place, and time with some occasional delayed responses which appears to be baseline. No cranial nerve deficit. Coordination, balance, strength, speech  and gait are at baseline - strength is equal bilaterally, BLE are +3 in strength and symmetric, BUE +4 and symmetric. No nystagmus Skin: Skin is warm and dry. No rash noted. No erythema.    No results found for this or any previous visit (from the past 72 hour(s)).  Assessment & Plan  1. COPD with exacerbation (HCC) - doxycycline (VIBRA-TABS) 100 MG tablet; Take 1 tablet (100 mg total) by mouth 2 (two) times daily for 7 days.  Dispense: 14 tablet; Refill: 0 - predniSONE (DELTASONE) 20 MG tablet; Take 1 tablet (20 mg total) by mouth 2 (two) times daily with a meal for 5 days. Last dose before 2pm  Dispense: 10 tablet; Refill: 0 - Continue Breo (sample provided today) and Combivent PRN. - Drink plenty of fluids.  2. Tobacco abuse - Discussed cessation, pt not ready to quit at this time.  -Red flags and when to present for emergency care or RTC including fever >101.30F, chest pain, shortness of breath unrelieved by combivent, worsening lightheadedness or near-syncope/syncope, new/worsening/un-resolving symptoms, reviewed with patient at time of visit. Follow up and care instructions discussed and provided in AVS.

## 2017-03-07 DIAGNOSIS — M797 Fibromyalgia: Secondary | ICD-10-CM | POA: Diagnosis not present

## 2017-03-07 DIAGNOSIS — G43909 Migraine, unspecified, not intractable, without status migrainosus: Secondary | ICD-10-CM | POA: Diagnosis not present

## 2017-03-07 DIAGNOSIS — E441 Mild protein-calorie malnutrition: Secondary | ICD-10-CM | POA: Diagnosis not present

## 2017-03-07 DIAGNOSIS — M810 Age-related osteoporosis without current pathological fracture: Secondary | ICD-10-CM | POA: Diagnosis not present

## 2017-03-07 DIAGNOSIS — I10 Essential (primary) hypertension: Secondary | ICD-10-CM | POA: Diagnosis not present

## 2017-03-07 DIAGNOSIS — I69354 Hemiplegia and hemiparesis following cerebral infarction affecting left non-dominant side: Secondary | ICD-10-CM | POA: Diagnosis not present

## 2017-03-07 DIAGNOSIS — M545 Low back pain: Secondary | ICD-10-CM | POA: Diagnosis not present

## 2017-03-07 DIAGNOSIS — G8929 Other chronic pain: Secondary | ICD-10-CM | POA: Diagnosis not present

## 2017-03-07 DIAGNOSIS — M25551 Pain in right hip: Secondary | ICD-10-CM | POA: Diagnosis not present

## 2017-03-07 DIAGNOSIS — J449 Chronic obstructive pulmonary disease, unspecified: Secondary | ICD-10-CM | POA: Diagnosis not present

## 2017-03-11 DIAGNOSIS — M25551 Pain in right hip: Secondary | ICD-10-CM | POA: Diagnosis not present

## 2017-03-11 DIAGNOSIS — M810 Age-related osteoporosis without current pathological fracture: Secondary | ICD-10-CM | POA: Diagnosis not present

## 2017-03-11 DIAGNOSIS — E441 Mild protein-calorie malnutrition: Secondary | ICD-10-CM | POA: Diagnosis not present

## 2017-03-11 DIAGNOSIS — I69354 Hemiplegia and hemiparesis following cerebral infarction affecting left non-dominant side: Secondary | ICD-10-CM | POA: Diagnosis not present

## 2017-03-11 DIAGNOSIS — G8929 Other chronic pain: Secondary | ICD-10-CM | POA: Diagnosis not present

## 2017-03-11 DIAGNOSIS — M797 Fibromyalgia: Secondary | ICD-10-CM | POA: Diagnosis not present

## 2017-03-11 DIAGNOSIS — G43909 Migraine, unspecified, not intractable, without status migrainosus: Secondary | ICD-10-CM | POA: Diagnosis not present

## 2017-03-11 DIAGNOSIS — M545 Low back pain: Secondary | ICD-10-CM | POA: Diagnosis not present

## 2017-03-11 DIAGNOSIS — J449 Chronic obstructive pulmonary disease, unspecified: Secondary | ICD-10-CM | POA: Diagnosis not present

## 2017-03-11 DIAGNOSIS — I10 Essential (primary) hypertension: Secondary | ICD-10-CM | POA: Diagnosis not present

## 2017-03-12 ENCOUNTER — Ambulatory Visit: Payer: Self-pay | Admitting: Family Medicine

## 2017-03-13 ENCOUNTER — Ambulatory Visit (INDEPENDENT_AMBULATORY_CARE_PROVIDER_SITE_OTHER): Payer: Medicare HMO | Admitting: Family Medicine

## 2017-03-13 ENCOUNTER — Encounter: Payer: Self-pay | Admitting: Family Medicine

## 2017-03-13 VITALS — BP 102/56 | HR 64 | Temp 97.5°F | Resp 14 | Wt 119.7 lb

## 2017-03-13 DIAGNOSIS — J441 Chronic obstructive pulmonary disease with (acute) exacerbation: Secondary | ICD-10-CM

## 2017-03-13 DIAGNOSIS — G43909 Migraine, unspecified, not intractable, without status migrainosus: Secondary | ICD-10-CM | POA: Diagnosis not present

## 2017-03-13 DIAGNOSIS — Z1231 Encounter for screening mammogram for malignant neoplasm of breast: Secondary | ICD-10-CM | POA: Diagnosis not present

## 2017-03-13 DIAGNOSIS — G8929 Other chronic pain: Secondary | ICD-10-CM | POA: Diagnosis not present

## 2017-03-13 DIAGNOSIS — M25551 Pain in right hip: Secondary | ICD-10-CM | POA: Diagnosis not present

## 2017-03-13 DIAGNOSIS — M545 Low back pain: Secondary | ICD-10-CM | POA: Diagnosis not present

## 2017-03-13 DIAGNOSIS — J449 Chronic obstructive pulmonary disease, unspecified: Secondary | ICD-10-CM | POA: Diagnosis not present

## 2017-03-13 DIAGNOSIS — I10 Essential (primary) hypertension: Secondary | ICD-10-CM

## 2017-03-13 DIAGNOSIS — E441 Mild protein-calorie malnutrition: Secondary | ICD-10-CM | POA: Diagnosis not present

## 2017-03-13 DIAGNOSIS — M810 Age-related osteoporosis without current pathological fracture: Secondary | ICD-10-CM | POA: Diagnosis not present

## 2017-03-13 DIAGNOSIS — M797 Fibromyalgia: Secondary | ICD-10-CM | POA: Diagnosis not present

## 2017-03-13 DIAGNOSIS — I69354 Hemiplegia and hemiparesis following cerebral infarction affecting left non-dominant side: Secondary | ICD-10-CM | POA: Diagnosis not present

## 2017-03-13 NOTE — Progress Notes (Signed)
Name: Beth [name]   MRN: [id number]    DOB: August 14, 1945   Date:03/13/2017       Progress Note  Subjective  Chief Complaint  Chief Complaint  Patient presents with  . Follow-up    COPD    HPI  COPD follow up: she was seen last week, pulse ox is up from 92% to 97%, cough is mild now, wheezing has improved and also SOB is better, only with moderated activity. No fever or chills, appetite is okay. She took medication as prescribed   HTN: bp is low and she is getting dizzy, on metoprolol and we will try to stop it today, monitor for palpitation and elevation of bp   Patient Active Problem List   Diagnosis Date Noted  . Urinary tract infection 12/05/2016  . Protein-calorie malnutrition, mild ([place]) 08/07/2016  . Generalized anxiety disorder 07/15/2016  . Chronic neck and back pain 07/15/2016  . Severe episode of recurrent major depressive disorder, without psychotic features ([place]) 07/14/2016  . Moderate benzodiazepine use disorder ([place]) 05/08/2016  . Major depressive disorder, recurrent, severe w/o psychotic behavior ([place]) 05/01/2016  . Seasonal allergic rhinitis 03/23/2015  . Hyperglycemia 03/23/2015  . Senile purpura ([place]) 03/23/2015  . Perennial allergic rhinitis with seasonal variation 03/23/2015  . Marital problems 10/31/2014  . Migraine without aura and without status migrainosus, not intractable 10/31/2014  . Chronic LBP 08/09/2014  . Colon polyp 08/09/2014  . COPD, severe ([place]) 08/09/2014  . CVA, old, hemiparesis ([place]) 08/09/2014  . Dyslipidemia 08/09/2014  . Dysfunction of eustachian tube 08/09/2014  . Fibromyalgia syndrome 08/09/2014  . Gastro-esophageal reflux disease without esophagitis 08/09/2014  . Benign migrating glossitis 08/09/2014  . Cerebrovascular accident, old 08/09/2014  . IBS (irritable bowel syndrome) 08/09/2014  . Low back pain with radiation 08/09/2014  . Chronic recurrent major depressive disorder ([place]) 08/09/2014  . Dysmetabolic syndrome  83/15/1761  . OP (osteoporosis) 08/09/2014  . Vitamin D deficiency 08/09/2014  . Benign hypertension 07/19/2013  . Benign neoplasm of skin of trunk 06/03/2013  . H/O: pneumonia 09/25/2012    Social History   Tobacco Use  . Smoking status: Current Some Day Smoker    Packs/day: 1.00    Years: 35.00    Pack years: 35.00    Types: Cigarettes  . Smokeless tobacco: Never Used  . Tobacco comment: Restarted at 1/2 a pack a day.   Substance Use Topics  . Alcohol use: No    Alcohol/week: 0.0 oz     Current Outpatient Medications:  .  acetaminophen (TYLENOL) 325 MG tablet, Take 650 mg by mouth every 4 (four) hours as needed., Disp: , Rfl:  .  ARIPiprazole (ABILIFY) 5 MG tablet, Take 5 mg by mouth daily., Disp: , Rfl:  .  aspirin 81 MG chewable tablet, Chew 81 mg daily by mouth., Disp: , Rfl:  .  atorvastatin (LIPITOR) 40 MG tablet, TAKE 1 TABLET(40 MG) BY MOUTH DAILY, Disp: 90 tablet, Rfl: 1 .  doxycycline (VIBRA-TABS) 100 MG tablet, Take 1 tablet (100 mg total) by mouth 2 (two) times daily for 7 days., Disp: 14 tablet, Rfl: 0 .  DULoxetine (CYMBALTA) 20 MG capsule, Take 1 capsule (20 mg total) by mouth daily., Disp: 30 capsule, Rfl: 3 .  fluticasone (FLONASE) 50 MCG/ACT nasal spray, Place 2 sprays into both nostrils daily., Disp: 16 g, Rfl: 2 .  fluticasone furoate-vilanterol (BREO ELLIPTA) 100-25 MCG/INH AEPB, Inhale 1 puff into the lungs daily., Disp: 60 each, Rfl: 5 .  gabapentin (NEURONTIN)  300 MG capsule, Take 2 capsules (600 mg total) by mouth 4 (four) times daily., Disp: 240 capsule, Rfl: 2 .  Ipratropium-Albuterol (COMBIVENT RESPIMAT) 20-100 MCG/ACT AERS respimat, INHALE 1 PUFF BY MOUTH EVERY 6 HOURS. MUST LAST 3 MONTHS, Disp: 1 Inhaler, Rfl: 0 .  Multiple Vitamin (THERA) TABS, Take by mouth., Disp: , Rfl:  .  azelastine (ASTELIN) 0.1 % nasal spray, Place 1 spray into both nostrils 2 (two) times daily. Use in each nostril as directed (Patient not taking: Reported on 03/13/2017),  Disp: 30 mL, Rfl: 2  Allergies  Allergen Reactions  . Bextra  [Valdecoxib]   . Compazine  [Prochlorperazine Edisylate]   . Lithium Carbonate   . Lyrica [Pregabalin]     ROS  Ten systems reviewed and is negative except as mentioned in HPI   Objective  Vitals:   03/13/17 1140  BP: (!) 102/56  Pulse: 64  Resp: 14  Temp: (!) 97.5 F (36.4 C)  TempSrc: Oral  SpO2: 97%  Weight: 119 lb 11.2 oz (54.3 kg)    Body mass index is 19.92 kg/m.    Physical Exam  Constitutional: Patient appears well-developed and thin. No distress.  HEENT: head atraumatic, normocephalic, pupils equal and reactive to light,  neck supple, throat within normal limits Cardiovascular: Normal rate, regular rhythm and normal heart sounds.  No murmur heard. No BLE edema. Pulmonary/Chest: Effort normal and breath sounds normal. No respiratory distress. Abdominal: Soft.  There is no tenderness. Psychiatric: Patient has a normal mood and affect. behavior is normal. Judgment and thought content normal.  Recent Results (from the past 2160 hour(s))  Comprehensive metabolic panel     Status: Abnormal   Collection Time: 12/18/16  3:39 PM  Result Value Ref Range   Sodium 142 135 - 145 mmol/L   Potassium 4.0 3.5 - 5.1 mmol/L   Chloride 105 101 - 111 mmol/L   CO2 27 22 - 32 mmol/L   Glucose, Bld 104 (H) 65 - 99 mg/dL   BUN 20 6 - 20 mg/dL   Creatinine, Ser 0.68 0.44 - 1.00 mg/dL   Calcium 9.5 8.9 - 10.3 mg/dL   Total Protein 7.6 6.5 - 8.1 g/dL   Albumin 4.0 3.5 - 5.0 g/dL   AST 22 15 - 41 U/L   ALT 17 14 - 54 U/L   Alkaline Phosphatase 80 38 - 126 U/L   Total Bilirubin 0.8 0.3 - 1.2 mg/dL   GFR calc non Af Amer >60 >60 mL/min   GFR calc Af Amer >60 >60 mL/min    Comment: (NOTE) The eGFR has been calculated using the CKD EPI equation. This calculation has not been validated in all clinical situations. eGFR's persistently <60 mL/min signify possible Chronic Kidney Disease.    Anion gap 10 5 - 15  CBC  with Differential     Status: None   Collection Time: 12/18/16  3:39 PM  Result Value Ref Range   WBC 9.5 3.6 - 11.0 K/uL   RBC 4.43 3.80 - 5.20 MIL/uL   Hemoglobin 14.0 12.0 - 16.0 g/dL   HCT 41.4 35.0 - 47.0 %   MCV 93.6 80.0 - 100.0 fL   MCH 31.6 26.0 - 34.0 pg   MCHC 33.8 32.0 - 36.0 g/dL   RDW 13.6 11.5 - 14.5 %   Platelets 261 150 - 440 K/uL   Neutrophils Relative % 65 %   Neutro Abs 6.2 1.4 - 6.5 K/uL   Lymphocytes Relative 26 %  Lymphs Abs 2.5 1.0 - 3.6 K/uL   Monocytes Relative 7 %   Monocytes Absolute 0.7 0.2 - 0.9 K/uL   Eosinophils Relative 1 %   Eosinophils Absolute 0.1 0 - 0.7 K/uL   Basophils Relative 1 %   Basophils Absolute 0.1 0 - 0.1 K/uL  Urinalysis, Complete w Microscopic     Status: Abnormal   Collection Time: 12/18/16  3:39 PM  Result Value Ref Range   Color, Urine YELLOW (A) YELLOW   APPearance CLOUDY (A) CLEAR   Specific Gravity, Urine 1.017 1.005 - 1.030   pH 6.0 5.0 - 8.0   Glucose, UA NEGATIVE NEGATIVE mg/dL   Hgb urine dipstick NEGATIVE NEGATIVE   Bilirubin Urine NEGATIVE NEGATIVE   Ketones, ur 20 (A) NEGATIVE mg/dL   Protein, ur 30 (A) NEGATIVE mg/dL   Nitrite NEGATIVE NEGATIVE   Leukocytes, UA LARGE (A) NEGATIVE   RBC / HPF 6-30 0 - 5 RBC/hpf   WBC, UA TOO NUMEROUS TO COUNT 0 - 5 WBC/hpf   Bacteria, UA NONE SEEN NONE SEEN   Squamous Epithelial / LPF 6-30 (A) NONE SEEN   Mucus PRESENT   CBC with Differential     Status: Abnormal   Collection Time: 01/04/17 12:28 PM  Result Value Ref Range   WBC 9.8 3.6 - 11.0 K/uL   RBC 4.20 3.80 - 5.20 MIL/uL   Hemoglobin 13.3 12.0 - 16.0 g/dL   HCT 39.2 35.0 - 47.0 %   MCV 93.4 80.0 - 100.0 fL   MCH 31.6 26.0 - 34.0 pg   MCHC 33.9 32.0 - 36.0 g/dL   RDW 14.1 11.5 - 14.5 %   Platelets 232 150 - 440 K/uL   Neutrophils Relative % 89 %   Neutro Abs 8.8 (H) 1.4 - 6.5 K/uL   Lymphocytes Relative 8 %   Lymphs Abs 0.7 (L) 1.0 - 3.6 K/uL   Monocytes Relative 3 %   Monocytes Absolute 0.3 0.2 - 0.9 K/uL    Eosinophils Relative 0 %   Eosinophils Absolute 0.0 0 - 0.7 K/uL   Basophils Relative 0 %   Basophils Absolute 0.0 0 - 0.1 K/uL  Comprehensive metabolic panel     Status: Abnormal   Collection Time: 01/04/17 12:28 PM  Result Value Ref Range   Sodium 140 135 - 145 mmol/L   Potassium 4.2 3.5 - 5.1 mmol/L   Chloride 104 101 - 111 mmol/L   CO2 27 22 - 32 mmol/L   Glucose, Bld 130 (H) 65 - 99 mg/dL   BUN 18 6 - 20 mg/dL   Creatinine, Ser 0.79 0.44 - 1.00 mg/dL   Calcium 8.6 (L) 8.9 - 10.3 mg/dL   Total Protein 5.8 (L) 6.5 - 8.1 g/dL   Albumin 3.1 (L) 3.5 - 5.0 g/dL   AST 23 15 - 41 U/L   ALT 34 14 - 54 U/L   Alkaline Phosphatase 80 38 - 126 U/L   Total Bilirubin 0.3 0.3 - 1.2 mg/dL   GFR calc non Af Amer >60 >60 mL/min   GFR calc Af Amer >60 >60 mL/min    Comment: (NOTE) The eGFR has been calculated using the CKD EPI equation. This calculation has not been validated in all clinical situations. eGFR's persistently <60 mL/min signify possible Chronic Kidney Disease.    Anion gap 9 5 - 15  Fibrin derivatives D-Dimer ([facility] only)     Status: None   Collection Time: 01/04/17 12:28 PM  Result Value Ref Range  Fibrin derivatives D-dimer ([facility]) 222.39 0.00 - 499.00 ng/mL (FEU)    Comment: (NOTE) <> Exclusion of Venous Thromboembolism (VTE) - OUTPATIENT ONLY   (Emergency Department or [place])   0-499 ng/ml (FEU): With a low to intermediate pretest probability                      for VTE this test result excludes the diagnosis                      of VTE.   >499 ng/ml (FEU) : VTE not excluded; additional work up for VTE is                      required. <> Testing on Inpatients and Evaluation of Disseminated Intravascular   Coagulation ([facility]) Reference Range:   0-499 ng/ml (FEU)   Compliance Drug Analysis, Ur     Status: None   Collection Time: 02/24/17 11:57 AM  Result Value Ref Range   Summary FINAL     Comment:  ==================================================================== [facility] COMP DRUG ANALYSIS,UR ==================================================================== Test                             Result       Flag       Units Drug Present and Declared for Prescription Verification   Gabapentin                     PRESENT      EXPECTED   Aripiprazole                   PRESENT      EXPECTED   Metoprolol                     PRESENT      EXPECTED Drug Present not Declared for Prescription Verification   7-aminoclonazepam              368          UNEXPECTED ng/mg creat    7-aminoclonazepam is an expected metabolite of clonazepam. Source    of clonazepam is a scheduled prescription medication.   Citalopram                     PRESENT      UNEXPECTED   Desmethylcitalopram            PRESENT      UNEXPECTED    Desmethylcitalopram is an expected metabolite of citalopram or    the enantiomeric form, escitalopram.   Hydroxyzine                    PRESENT      UNEXPECTED D rug Absent but Declared for Prescription Verification   Duloxetine                     Not Detected UNEXPECTED   Acetaminophen                  Not Detected UNEXPECTED    Acetaminophen, as indicated in the declared medication list, is    not always detected even when used as directed.   Salicylate                     Not Detected UNEXPECTED    Aspirin, as indicated in the declared  medication list, is not    always detected even when used as directed. ==================================================================== Test                      Result    Flag   Units      Ref Range   Creatinine              62               mg/dL      >=20 ==================================================================== Declared Medications:  The flagging and interpretation on this report are based on the  following declared medications.  Unexpected results may arise from  inaccuracies in the declared medications.  **Note: The  testing scope of this panel includes these medications:   Aripiprazole  Duloxetine  Gabapentin  Metoprolol  **Note: The testing scope of this panel does not include small to  moderate amounts of these reported medications:  Acetaminophen  Aspirin (Aspirin 81)  **Note: The testing scope of this panel does not include following  reported medications:  Albuterol (Ipratropium-Albuterol)  Atorvastatin  Azelastine  Fluticasone  Fluticasone (Breo)  Ipratropium (Ipratropium-Albuterol)  Multivitamin (MVI)  Vilanterol (Breo) ==================================================================== For clinical consultation, please call [phone]. ====================================================================      Assessment & Plan  1. COPD with exacerbation ([place])  Doing well, continue inhalers, back to baseline.   2. Visit for screening mammogram  - MM Digital Screening  3. Benign hypertension  Stop metoprolol, bp is low

## 2017-03-18 ENCOUNTER — Encounter: Payer: Medicare HMO | Admitting: Student in an Organized Health Care Education/Training Program

## 2017-03-18 DIAGNOSIS — G43909 Migraine, unspecified, not intractable, without status migrainosus: Secondary | ICD-10-CM | POA: Diagnosis not present

## 2017-03-18 DIAGNOSIS — I10 Essential (primary) hypertension: Secondary | ICD-10-CM | POA: Diagnosis not present

## 2017-03-18 DIAGNOSIS — M810 Age-related osteoporosis without current pathological fracture: Secondary | ICD-10-CM | POA: Diagnosis not present

## 2017-03-18 DIAGNOSIS — E441 Mild protein-calorie malnutrition: Secondary | ICD-10-CM | POA: Diagnosis not present

## 2017-03-18 DIAGNOSIS — M25551 Pain in right hip: Secondary | ICD-10-CM | POA: Diagnosis not present

## 2017-03-18 DIAGNOSIS — J449 Chronic obstructive pulmonary disease, unspecified: Secondary | ICD-10-CM | POA: Diagnosis not present

## 2017-03-18 DIAGNOSIS — G8929 Other chronic pain: Secondary | ICD-10-CM | POA: Diagnosis not present

## 2017-03-18 DIAGNOSIS — I69354 Hemiplegia and hemiparesis following cerebral infarction affecting left non-dominant side: Secondary | ICD-10-CM | POA: Diagnosis not present

## 2017-03-18 DIAGNOSIS — M797 Fibromyalgia: Secondary | ICD-10-CM | POA: Diagnosis not present

## 2017-03-18 DIAGNOSIS — M545 Low back pain: Secondary | ICD-10-CM | POA: Diagnosis not present

## 2017-03-20 DIAGNOSIS — G8929 Other chronic pain: Secondary | ICD-10-CM | POA: Diagnosis not present

## 2017-03-20 DIAGNOSIS — M797 Fibromyalgia: Secondary | ICD-10-CM | POA: Diagnosis not present

## 2017-03-20 DIAGNOSIS — I10 Essential (primary) hypertension: Secondary | ICD-10-CM | POA: Diagnosis not present

## 2017-03-20 DIAGNOSIS — J449 Chronic obstructive pulmonary disease, unspecified: Secondary | ICD-10-CM | POA: Diagnosis not present

## 2017-03-20 DIAGNOSIS — G43909 Migraine, unspecified, not intractable, without status migrainosus: Secondary | ICD-10-CM | POA: Diagnosis not present

## 2017-03-20 DIAGNOSIS — M545 Low back pain: Secondary | ICD-10-CM | POA: Diagnosis not present

## 2017-03-20 DIAGNOSIS — E441 Mild protein-calorie malnutrition: Secondary | ICD-10-CM | POA: Diagnosis not present

## 2017-03-20 DIAGNOSIS — M25551 Pain in right hip: Secondary | ICD-10-CM | POA: Diagnosis not present

## 2017-03-20 DIAGNOSIS — M810 Age-related osteoporosis without current pathological fracture: Secondary | ICD-10-CM | POA: Diagnosis not present

## 2017-03-20 DIAGNOSIS — I69354 Hemiplegia and hemiparesis following cerebral infarction affecting left non-dominant side: Secondary | ICD-10-CM | POA: Diagnosis not present

## 2017-03-27 ENCOUNTER — Other Ambulatory Visit: Payer: Self-pay

## 2017-03-27 ENCOUNTER — Encounter: Payer: Self-pay | Admitting: Student in an Organized Health Care Education/Training Program

## 2017-03-27 ENCOUNTER — Ambulatory Visit
Payer: Medicare HMO | Attending: Student in an Organized Health Care Education/Training Program | Admitting: Student in an Organized Health Care Education/Training Program

## 2017-03-27 VITALS — BP 135/71 | HR 103 | Temp 98.0°F | Resp 18 | Ht 65.0 in | Wt 119.0 lb

## 2017-03-27 DIAGNOSIS — G8929 Other chronic pain: Secondary | ICD-10-CM

## 2017-03-27 DIAGNOSIS — M542 Cervicalgia: Secondary | ICD-10-CM | POA: Insufficient documentation

## 2017-03-27 DIAGNOSIS — M4316 Spondylolisthesis, lumbar region: Secondary | ICD-10-CM | POA: Insufficient documentation

## 2017-03-27 DIAGNOSIS — Z888 Allergy status to other drugs, medicaments and biological substances status: Secondary | ICD-10-CM | POA: Insufficient documentation

## 2017-03-27 DIAGNOSIS — E785 Hyperlipidemia, unspecified: Secondary | ICD-10-CM | POA: Diagnosis not present

## 2017-03-27 DIAGNOSIS — G43909 Migraine, unspecified, not intractable, without status migrainosus: Secondary | ICD-10-CM | POA: Diagnosis not present

## 2017-03-27 DIAGNOSIS — K589 Irritable bowel syndrome without diarrhea: Secondary | ICD-10-CM | POA: Diagnosis not present

## 2017-03-27 DIAGNOSIS — Z79899 Other long term (current) drug therapy: Secondary | ICD-10-CM | POA: Insufficient documentation

## 2017-03-27 DIAGNOSIS — M5137 Other intervertebral disc degeneration, lumbosacral region: Secondary | ICD-10-CM | POA: Diagnosis not present

## 2017-03-27 DIAGNOSIS — M5136 Other intervertebral disc degeneration, lumbar region: Secondary | ICD-10-CM | POA: Diagnosis not present

## 2017-03-27 DIAGNOSIS — D239 Other benign neoplasm of skin, unspecified: Secondary | ICD-10-CM | POA: Diagnosis not present

## 2017-03-27 DIAGNOSIS — R739 Hyperglycemia, unspecified: Secondary | ICD-10-CM | POA: Insufficient documentation

## 2017-03-27 DIAGNOSIS — J302 Other seasonal allergic rhinitis: Secondary | ICD-10-CM | POA: Insufficient documentation

## 2017-03-27 DIAGNOSIS — G629 Polyneuropathy, unspecified: Secondary | ICD-10-CM | POA: Insufficient documentation

## 2017-03-27 DIAGNOSIS — B0229 Other postherpetic nervous system involvement: Secondary | ICD-10-CM

## 2017-03-27 DIAGNOSIS — M545 Low back pain, unspecified: Secondary | ICD-10-CM

## 2017-03-27 DIAGNOSIS — E559 Vitamin D deficiency, unspecified: Secondary | ICD-10-CM | POA: Diagnosis not present

## 2017-03-27 DIAGNOSIS — M797 Fibromyalgia: Secondary | ICD-10-CM | POA: Insufficient documentation

## 2017-03-27 DIAGNOSIS — D692 Other nonthrombocytopenic purpura: Secondary | ICD-10-CM | POA: Insufficient documentation

## 2017-03-27 DIAGNOSIS — J449 Chronic obstructive pulmonary disease, unspecified: Secondary | ICD-10-CM | POA: Insufficient documentation

## 2017-03-27 DIAGNOSIS — M25551 Pain in right hip: Secondary | ICD-10-CM

## 2017-03-27 DIAGNOSIS — Z7982 Long term (current) use of aspirin: Secondary | ICD-10-CM | POA: Insufficient documentation

## 2017-03-27 DIAGNOSIS — I69359 Hemiplegia and hemiparesis following cerebral infarction affecting unspecified side: Secondary | ICD-10-CM | POA: Insufficient documentation

## 2017-03-27 DIAGNOSIS — F339 Major depressive disorder, recurrent, unspecified: Secondary | ICD-10-CM | POA: Insufficient documentation

## 2017-03-27 DIAGNOSIS — Z86010 Personal history of colonic polyps: Secondary | ICD-10-CM | POA: Diagnosis not present

## 2017-03-27 DIAGNOSIS — I10 Essential (primary) hypertension: Secondary | ICD-10-CM | POA: Diagnosis not present

## 2017-03-27 DIAGNOSIS — K219 Gastro-esophageal reflux disease without esophagitis: Secondary | ICD-10-CM | POA: Diagnosis not present

## 2017-03-27 DIAGNOSIS — M792 Neuralgia and neuritis, unspecified: Secondary | ICD-10-CM

## 2017-03-27 DIAGNOSIS — G894 Chronic pain syndrome: Secondary | ICD-10-CM | POA: Insufficient documentation

## 2017-03-27 MED ORDER — DULOXETINE HCL 20 MG PO CPEP: 40.0000 mg | ORAL_CAPSULE | Freq: Every day | ORAL | 3 refills | Status: DC

## 2017-03-27 NOTE — Progress Notes (Signed)
Safety precautions to be maintained throughout the outpatient stay will include: orient to surroundings, keep bed in low position, maintain call bell within reach at all times, provide assistance with transfer out of bed and ambulation.  

## 2017-03-27 NOTE — Progress Notes (Signed)
Patient's Name: Beth [name]  MRN: [id number]  Referring Provider: Steele Sizer, MD  DOB: 02/02/1945  PCP: Steele Sizer, MD  DOS: 03/27/2017  Note by: [name], MD  Service setting: Ambulatory outpatient  Specialty: Interventional Pain Management  Location: [facility] (AMB) Pain Management Facility    Patient type: Established   Primary Reason(s) for Visit: Encounter for prescription drug management. (Level of risk: moderate)  CC: Back Pain (low and bilateral)  HPI  Beth [name] is a 72 y.o. year old, female patient, who comes today for a medication management evaluation. She has Chronic LBP; Colon polyp; COPD, severe ([place]); CVA, old, hemiparesis ([place]); Dyslipidemia; Dysfunction of eustachian tube; Fibromyalgia syndrome; Gastro-esophageal reflux disease without esophagitis; Benign migrating glossitis; Cerebrovascular accident, old; H/O: pneumonia; Benign hypertension; IBS (irritable bowel syndrome); Low back pain with radiation; Chronic recurrent major depressive disorder ([place]); Dysmetabolic syndrome; Benign neoplasm of skin of trunk; OP (osteoporosis); Vitamin D deficiency; Marital problems; Migraine without aura and without status migrainosus, not intractable; Seasonal allergic rhinitis; Hyperglycemia; Senile purpura ([place]); Perennial allergic rhinitis with seasonal variation; Severe episode of recurrent major depressive disorder, without psychotic features ([place]); Moderate benzodiazepine use disorder ([place]); Major depressive disorder, recurrent, severe w/o psychotic behavior ([place]); Generalized anxiety disorder; Chronic neck and back pain; Protein-calorie malnutrition, mild ([place]); and Urinary tract infection on their problem list. Her primarily concern today is the Back Pain (low and bilateral)  Pain Assessment: Location: Lower Back Radiating: right leg, right hip esp in AM Onset: More than a month ago Duration:  varies but flares can last hours Quality: Throbbing Severity: 7 /10  (self-reported pain score)  Note: Reported level is inconsistent with clinical observations. Clinically the patient looks like a 2/10 A 2/10 is viewed as "Mild to Moderate" and described as noticeable and distracting. Impossible to hide from other people. More frequent flare-ups. Still possible to adapt and function close to normal. It can be very annoying and may have occasional stronger flare-ups. With discipline, patients may get used to it and adapt.       When using our objective Pain Scale, levels between 6 and 10/10 are said to belong in an emergency room, as it progressively worsens from a 6/10, described as severely limiting, requiring emergency care not usually available at an outpatient pain management facility. At a 6/10 level, communication becomes difficult and requires great effort. Assistance to reach the emergency department may be required. Facial flushing and profuse sweating along with potentially dangerous increases in heart rate and blood pressure will be evident. Effect on ADL:  limits Timing: Intermittent Modifying factors: gabapentin and PT  Beth [name] was last scheduled for an appointment on 02/24/2017 for medication management. During today's appointment we reviewed Beth [name]'s chronic pain status, as well as her outpatient medication regimen.  Patient returns for follow-up.  At last visit her gabapentin was increased to 600 mg 4 times a day.  She is also been participating in physical therapy.  She finds both of these interventions effective in management of her pain.    The patient  reports that she does not use drugs. Her body mass index is 19.8 kg/m.  Further details on both, my assessment(s), as well as the proposed treatment plan, please see below.  Laboratory Chemistry  Inflammation Markers (CRP: Acute Phase) (ESR: Chronic Phase) Lab Results  Component Value Date   CRP 0.3 03/21/2016   ESRSEDRATE 1 03/21/2016   LATICACIDVEN 1.3 12/23/2015  Rheumatology Markers No results found for: Elayne Guerin, [facility]              Renal Function Markers Lab Results  Component Value Date   BUN 18 01/04/2017   CREATININE 0.79 01/04/2017   GFRAA >60 01/04/2017   GFRNONAA >60 01/04/2017                 Hepatic Function Markers Lab Results  Component Value Date   AST 23 01/04/2017   ALT 34 01/04/2017   ALBUMIN 3.1 (L) 01/04/2017   ALKPHOS 80 01/04/2017   LIPASE 27 02/13/2016                 Electrolytes Lab Results  Component Value Date   NA 140 01/04/2017   K 4.2 01/04/2017   CL 104 01/04/2017   CALCIUM 8.6 (L) 01/04/2017   MG 1.8 12/25/2012                        Neuropathy Markers Lab Results  Component Value Date   HGBA1C 5.0 07/15/2016                 Bone Pathology Markers Lab Results  Component Value Date   VD25OH 37 03/21/2016                         Coagulation Parameters Lab Results  Component Value Date   PLT 232 01/04/2017                 Cardiovascular Markers Lab Results  Component Value Date   CKTOTAL 37 (L) 12/05/2016   TROPONINI <0.03 11/15/2016   HGB 13.3 01/04/2017   HCT 39.2 01/04/2017                 CA Markers No results found for: CEA, CA125, LABCA2               Note: Lab results reviewed.  Recent Diagnostic Imaging Results  DG Si Joints CLINICAL DATA:  Low back pain.  EXAM: BILATERAL SACROILIAC JOINTS - 3+ VIEW  COMPARISON:  None.  FINDINGS: The sacroiliac joint spaces are maintained and there is no evidence of arthropathy. No acute bone abnormalities are seen. Degenerative disc disease noted in the lower lumbar spine.  IMPRESSION: No acute bony abnormality.  Electronically Signed   By: Rolm Baptise M.D.   On: 02/24/2017 17:52 DG Lumbar Spine Complete W/Bend CLINICAL DATA:  Chronic bilateral low back pain  EXAM: LUMBAR SPINE - COMPLETE WITH BENDING VIEWS  COMPARISON:  CT 02/13/2016 and  12/23/2015  FINDINGS: Degenerative disc disease changes at L5-S1 with disc space narrowing, vacuum disc and spurring. Mild disc space narrowing and spurring at L2-3 and L3-4. Mild degenerative facet disease from L2-3 through L5-S1, most pronounced at L5-S1. Slight retrolisthesis of L2 on L3 and L3 on L4 of approximately 3-4 mm on neutral images. No change with flexion or extension. Mild to moderate compression fracture through the superior endplate of G92, stable since prior abdominal CT and dating back to CT from 12/23/2015. No acute fracture. SI joints are symmetric and unremarkable.  IMPRESSION: Degenerative disc and facet disease throughout the lumbar spine. No acute bony abnormality.  Mild to moderate compression fracture involving the superior endplate of J19, stable since recent abdominal CTs.  Slight retrolisthesis of L2 on L3 and L3 on L4 related to facet disease. No instability with flexion or extension.  Electronically Signed   By: Rolm Baptise M.D.   On: 02/24/2017 17:49  Complexity Note: Imaging results reviewed. Results shared with Beth [name], using [employer].                         Meds   Current Outpatient Medications:  .  acetaminophen (TYLENOL) 325 MG tablet, Take 650 mg by mouth every 4 (four) hours as needed., Disp: , Rfl:  .  ARIPiprazole (ABILIFY) 5 MG tablet, Take 5 mg by mouth daily., Disp: , Rfl:  .  aspirin 81 MG chewable tablet, Chew 81 mg daily by mouth., Disp: , Rfl:  .  atorvastatin (LIPITOR) 40 MG tablet, TAKE 1 TABLET(40 MG) BY MOUTH DAILY, Disp: 90 tablet, Rfl: 1 .  azelastine (ASTELIN) 0.1 % nasal spray, Place 1 spray into both nostrils 2 (two) times daily. Use in each nostril as directed, Disp: 30 mL, Rfl: 2 .  DULoxetine (CYMBALTA) 20 MG capsule, Take 2 capsules (40 mg total) by mouth daily., Disp: 60 capsule, Rfl: 3 .  fluticasone (FLONASE) 50 MCG/ACT nasal spray, Place 2 sprays into both nostrils daily., Disp: 16 g, Rfl: 2 .   fluticasone furoate-vilanterol (BREO ELLIPTA) 100-25 MCG/INH AEPB, Inhale 1 puff into the lungs daily., Disp: 60 each, Rfl: 5 .  gabapentin (NEURONTIN) 300 MG capsule, Take 2 capsules (600 mg total) by mouth 4 (four) times daily., Disp: 240 capsule, Rfl: 2 .  Ipratropium-Albuterol (COMBIVENT RESPIMAT) 20-100 MCG/ACT AERS respimat, INHALE 1 PUFF BY MOUTH EVERY 6 HOURS. MUST LAST 3 MONTHS, Disp: 1 Inhaler, Rfl: 0 .  Multiple Vitamin (THERA) TABS, Take by mouth., Disp: , Rfl:   ROS  Constitutional: Denies any fever or chills Gastrointestinal: No reported hemesis, hematochezia, vomiting, or acute GI distress Musculoskeletal: Denies any acute onset joint swelling, redness, loss of ROM, or weakness Neurological: No reported episodes of acute onset apraxia, aphasia, dysarthria, agnosia, amnesia, paralysis, loss of coordination, or loss of consciousness  Allergies  Beth [name] is allergic to bextra  [valdecoxib]; compazine  [prochlorperazine edisylate]; lithium carbonate; and lyrica [pregabalin].  [place]  Drug: Beth [name]  reports that she does not use drugs. Alcohol:  reports that she does not drink alcohol. Tobacco:  reports that she has been smoking cigarettes.  She has a 35.00 pack-year smoking history. she has never used smokeless tobacco. Medical:  has a past medical history of Allergy, Anxiety, Asthma, Chronic pain syndrome, COPD (chronic obstructive pulmonary disease) ([place]), CVA (cerebral infarction), Depression, Fibromyalgia, Headache, Hyperlipidemia, Hypertension, IBS (irritable bowel syndrome), Stroke ([place]), and Vitamin D deficiency. Surgical: Beth [name]  has a past surgical history that includes Breast surgery (2011); Cervical discectomy; Appendectomy; Abdominal hysterectomy; Cholecystectomy; and Sinusotomy. Family: family history includes Alcohol abuse in her father; Anxiety disorder in her mother; Cancer in her father; Depression in her father and mother; Gallbladder disease in her  father.  Constitutional Exam  General appearance: Well nourished, well developed, and well hydrated. In no apparent acute distress Vitals:   03/27/17 1337  BP: 135/71  Pulse: (!) 103  Resp: 18  Temp: 98 F (36.7 C)  TempSrc: Oral  SpO2: 98%  Weight: 119 lb (54 kg)  Height: _0  (1.651 m)   BMI Assessment: Estimated body mass index is 19.8 kg/m as calculated from the following:   Height as of this encounter: _1  (1.651 m).   Weight as of this encounter: 119 lb (54 kg).  BMI interpretation table: BMI level Category Range  association with higher incidence of chronic pain  <18 kg/m2 Underweight   18.5-24.9 kg/m2 Ideal body weight   25-29.9 kg/m2 Overweight Increased incidence by 20%  30-34.9 kg/m2 Obese (Class I) Increased incidence by 68%  35-39.9 kg/m2 Severe obesity (Class II) Increased incidence by 136%  >40 kg/m2 Extreme obesity (Class III) Increased incidence by 254%   BMI Readings from Last 4 Encounters:  03/27/17 19.80 kg/m  03/13/17 19.92 kg/m  03/06/17 19.40 kg/m  02/24/17 18.64 kg/m   Wt Readings from Last 4 Encounters:  03/27/17 119 lb (54 kg)  03/13/17 119 lb 11.2 oz (54.3 kg)  03/06/17 116 lb 9.6 oz (52.9 kg)  02/24/17 112 lb (50.8 kg)  Psych/Mental status: Alert, oriented x 3 (person, place, & time)       Eyes: PERLA Respiratory: No evidence of acute respiratory distress   Cervical Spine Area Exam  Skin & Axial Inspection: No masses, redness, edema, swelling, or associated skin lesions Alignment: Symmetrical Functional ROM: Decreased ROM, Decreased cervical extension Stability: No instability detected Muscle Tone/Strength: Functionally intact. No obvious neuro-muscular anomalies detected. Sensory (Neurological): Unimpaired Palpation: No palpable anomalies                     Upper Extremity (UE) Exam    Side: Right upper extremity  Side: Left upper extremity   Skin & Extremity Inspection: Skin color, temperature, and hair growth are WNL.  No peripheral edema or cyanosis. No masses, redness, swelling, asymmetry, or associated skin lesions. No contractures.  Skin & Extremity Inspection: Skin color, temperature, and hair growth are WNL. No peripheral edema or cyanosis. No masses, redness, swelling, asymmetry, or associated skin lesions. No contractures.   Functional ROM: Unrestricted ROM          Functional ROM: Unrestricted ROM           Muscle Tone/Strength: Functionally intact. No obvious neuro-muscular anomalies detected.  Muscle Tone/Strength: Functionally intact. No obvious neuro-muscular anomalies detected.   Sensory (Neurological): Unimpaired          Sensory (Neurological): Unimpaired           Palpation: No palpable anomalies              Palpation: No palpable anomalies               Specialized Test(s): Deferred         Specialized Test(s): Deferred          5 out of 5 strength bilateral upper extremity: Shoulder abduction, elbow flexion, elbow extension, thumb extension.  Thoracic Spine Area Exam  Skin & Axial Inspection: No masses, redness, or swelling Alignment: Symmetrical Functional ROM: Unrestricted ROM Stability: No instability detected Muscle Tone/Strength: Functionally intact. No obvious neuro-muscular anomalies detected. Sensory (Neurological): Unimpaired Muscle strength & Tone: No palpable anomalies  Lumbar Spine Area Exam  Skin & Axial Inspection: No masses, redness, or swelling Alignment: Symmetrical Functional ROM: Unrestricted ROM      Stability: No instability detected Muscle Tone/Strength: Functionally intact. No obvious neuro-muscular anomalies detected. Sensory (Neurological): Unimpaired Palpation: No palpable anomalies       Provocative Tests: Lumbar Hyperextension and rotation test: Positive bilaterally for facet joint pain. Lumbar Lateral bending test: Positive due to pain. Patrick's Maneuver: evaluation deferred today                    Gait & Posture Assessment  Ambulation:  Patient ambulates using a cane Gait: Antalgic Posture: Difficulty standing up straight, due  to pain   Lower Extremity Exam    Side: Right lower extremity  Side: Left lower extremity  Skin & Extremity Inspection: Skin color, temperature, and hair growth are WNL. No peripheral edema or cyanosis. No masses, redness, swelling, asymmetry, or associated skin lesions. No contractures.  Skin & Extremity Inspection: Skin color, temperature, and hair growth are WNL. No peripheral edema or cyanosis. No masses, redness, swelling, asymmetry, or associated skin lesions. No contractures.  Functional ROM: Unrestricted ROM          Functional ROM: Unrestricted ROM          Muscle Tone/Strength: Functionally intact. No obvious neuro-muscular anomalies detected.  Muscle Tone/Strength: Functionally intact. No obvious neuro-muscular anomalies detected.  Sensory (Neurological): Neuropathic pain pattern  Sensory (Neurological): Unimpaired  Palpation: No palpable anomalies  Palpation: No palpable anomalies  5 out of 5 strength bilateral lower extremity: Plantar flexion, dorsiflexion, knee flexion, knee extension.  Assessment  Primary Diagnosis & Pertinent Problem List: The primary encounter diagnosis was Chronic pain syndrome. Diagnoses of Post herpetic neuralgia, Neuropathic pain, Chronic bilateral low back pain without sciatica, and Right hip pain were also pertinent to this visit.  Status Diagnosis  Persistent Responding Responding 1. Chronic pain syndrome   2. Post herpetic neuralgia   3. Neuropathic pain   4. Chronic bilateral low back pain without sciatica   5. Right hip pain      General Recommendations: The pain condition that the patient suffers from is best treated with a multidisciplinary approach that involves an increase in physical activity to prevent de-conditioning and worsening of the pain cycle, as well as psychological counseling (formal and/or informal) to address the co-morbid  psychological affects of pain. Treatment will often involve judicious use of pain medications and interventional procedures to decrease the pain, allowing the patient to participate in the physical activity that will ultimately produce long-lasting pain reductions. The goal of the multidisciplinary approach is to return the patient to a higher level of overall function and to restore their ability to perform activities of daily living.  72 year old female with a history of previous stroke, COPD, postherpetic neuralgia who presents with complaint of pain at her neck, low back, right hip and right leg.  Patient does have a history of anterior cervical fusion at C5-C7 greater than 15 years ago.  She does have associated neck pain and has difficulty with cervical extension.  Her pain symptoms are consistent with cervical facet syndrome.  Furthermore the patient also has axial low back pain that is secondary to lumbar degenerative disc disease, lumbar facet arthropathy and spondylosis.  This is worsened over the last 5-10 years.  Patient presents today for follow-up.  Her lumbar spine and SI joint x-rays were reviewed.  Lumbar spine x-rays show facet arthropathy and L2 retrolisthesis of L3.  Patient also has lumbar degenerative disc disease.  Her SI joint x-rays show right greater than left SI joint arthritis which is mild upon my review.  At the patient's last visit, her gabapentin was increased to 600 mg 4 times a day for maximum dose of 2400 mg daily.  She is finding benefit with this medication.  She notices improvement of her general neuropathic pain.  Patient has also been participating in physical therapy which she finds helpful.  She states that she is continuing to do those exercises at home.  I encouraged the patient to remain consistent in performing these exercises at home.  She was also started on Cymbalta 20 mg daily  which she finds effective.  She does not have any side effect from this medication.   We will increase her dose to 40 mill grams daily and prescription was provided.  Plan: -Continue gabapentin 600 mg 4 times daily - Increase Cymbalta to 40 mg daily, prescription provided -Continue physical therapy exercises at home daily  Future considerations could include: Lumbar facet medial branch nerve blocks, right sacroiliac joint injection.   Plan of Care  Pharmacotherapy (Medications Ordered): Meds ordered this encounter  Medications  . DULoxetine (CYMBALTA) 20 MG capsule    Sig: Take 2 capsules (40 mg total) by mouth daily.    Dispense:  60 capsule    Refill:  3    Provider-requested follow-up: Return in about 3 months (around 06/24/2017) for Medication Management.   Time Note: Greater than 50% of the 25 minute(s) of face-to-face time spent with Beth [name], was spent in counseling/coordination of care regarding: the appropriate use of the pain scale, Beth [name]'s primary cause of pain, the results of her recent test(s), the treatment plan, treatment alternatives, medication side effects, the appropriate use of her medications, realistic expectations and the goals of pain management (increased in functionality).  Future Appointments  Date Time Provider [place]  06/24/2017 11:30 AM [name], MD [facility] None    Primary Care Physician: Steele Sizer, MD Location: [facility] Outpatient Pain Management Facility Note by: [name], M.D Date: 03/27/2017; Time: 2:33 PM  There are no Patient Instructions on file for this visit.

## 2017-03-31 ENCOUNTER — Telehealth: Payer: Self-pay | Admitting: *Deleted

## 2017-03-31 NOTE — Telephone Encounter (Signed)
Pharmacy called and Gabapentin changed due to insurance reasons Gabapentin 600mg  one 4 x per day instead of 300mg  (2) four times per day. Per Dr. Holley Raring ok.

## 2017-04-02 ENCOUNTER — Telehealth: Payer: Self-pay | Admitting: Student in an Organized Health Care Education/Training Program

## 2017-04-02 DIAGNOSIS — M1712 Unilateral primary osteoarthritis, left knee: Secondary | ICD-10-CM | POA: Diagnosis not present

## 2017-04-02 DIAGNOSIS — M1711 Unilateral primary osteoarthritis, right knee: Secondary | ICD-10-CM | POA: Diagnosis not present

## 2017-04-02 DIAGNOSIS — M5136 Other intervertebral disc degeneration, lumbar region: Secondary | ICD-10-CM | POA: Diagnosis not present

## 2017-04-02 DIAGNOSIS — M5416 Radiculopathy, lumbar region: Secondary | ICD-10-CM | POA: Diagnosis not present

## 2017-04-02 NOTE — Telephone Encounter (Signed)
Patient states she is itching all over and could this be from medications, please call

## 2017-04-02 NOTE — Telephone Encounter (Signed)
No answer.Left voicemail for patient to return call. 

## 2017-04-03 NOTE — Telephone Encounter (Signed)
Spoke with patient and she is having itching that she thinks is related to the increase in cymbalta from 20 mg - 40 mg.  She skipped one day of the medicine and then started back at 20 mg with Benadryl bid and she states itching is better. Patient would like to increase to 40 mg.  During the conversation we talked about different ways to handle this issue, stop the medication and in a couple of days start back to make sure the medication is the issue.  I told her that I would give this information to Dr Holley Raring and see what his recommendation is.

## 2017-04-09 DIAGNOSIS — I69334 Monoplegia of upper limb following cerebral infarction affecting left non-dominant side: Secondary | ICD-10-CM | POA: Diagnosis not present

## 2017-04-09 DIAGNOSIS — G629 Polyneuropathy, unspecified: Secondary | ICD-10-CM | POA: Diagnosis not present

## 2017-04-09 DIAGNOSIS — I10 Essential (primary) hypertension: Secondary | ICD-10-CM | POA: Diagnosis not present

## 2017-04-09 DIAGNOSIS — E785 Hyperlipidemia, unspecified: Secondary | ICD-10-CM | POA: Diagnosis not present

## 2017-04-09 DIAGNOSIS — K219 Gastro-esophageal reflux disease without esophagitis: Secondary | ICD-10-CM | POA: Diagnosis not present

## 2017-04-09 DIAGNOSIS — R69 Illness, unspecified: Secondary | ICD-10-CM | POA: Diagnosis not present

## 2017-04-09 DIAGNOSIS — I69341 Monoplegia of lower limb following cerebral infarction affecting right dominant side: Secondary | ICD-10-CM | POA: Diagnosis not present

## 2017-04-09 DIAGNOSIS — J449 Chronic obstructive pulmonary disease, unspecified: Secondary | ICD-10-CM | POA: Diagnosis not present

## 2017-04-09 DIAGNOSIS — I499 Cardiac arrhythmia, unspecified: Secondary | ICD-10-CM | POA: Diagnosis not present

## 2017-06-06 DIAGNOSIS — R69 Illness, unspecified: Secondary | ICD-10-CM | POA: Diagnosis not present

## 2017-06-13 ENCOUNTER — Telehealth: Payer: Self-pay | Admitting: Family Medicine

## 2017-06-13 NOTE — Telephone Encounter (Signed)
Copied from [place] [id number]. Topic: Inquiry >> Jun 13, 2017  3:08 PM Margot Ables wrote: Reason for CRM: pt called requesting a medication be sent to the pharmacy. She is having fever blisters in both corners of her mouth. She said they start to go away then come right back x 3 weeks.   [facility] [address] [place] [address] [place] ([place]) [place] [address] Phone: [phone] Fax: [phone]

## 2017-06-15 NOTE — Telephone Encounter (Signed)
She needs to be seen.

## 2017-06-16 ENCOUNTER — Ambulatory Visit: Payer: Self-pay | Admitting: Family Medicine

## 2017-06-16 NOTE — Telephone Encounter (Signed)
Pt called - she can not come in at 1 today, offered Friday, she could not come in that early.

## 2017-06-16 NOTE — Telephone Encounter (Signed)
LVM for patient to come in for an appointment with Dr. Ancil Boozer at [facility] today, 06/16/17.

## 2017-06-24 ENCOUNTER — Encounter: Payer: Self-pay | Admitting: Student in an Organized Health Care Education/Training Program

## 2017-06-24 ENCOUNTER — Ambulatory Visit: Payer: Self-pay | Admitting: Family Medicine

## 2017-06-24 ENCOUNTER — Ambulatory Visit
Payer: Medicare HMO | Attending: Student in an Organized Health Care Education/Training Program | Admitting: Student in an Organized Health Care Education/Training Program

## 2017-06-24 VITALS — BP 108/81 | HR 87 | Temp 98.1°F | Resp 16 | Ht 65.0 in | Wt 118.0 lb

## 2017-06-24 DIAGNOSIS — R739 Hyperglycemia, unspecified: Secondary | ICD-10-CM | POA: Insufficient documentation

## 2017-06-24 DIAGNOSIS — M79604 Pain in right leg: Secondary | ICD-10-CM | POA: Insufficient documentation

## 2017-06-24 DIAGNOSIS — M79605 Pain in left leg: Secondary | ICD-10-CM | POA: Diagnosis not present

## 2017-06-24 DIAGNOSIS — E785 Hyperlipidemia, unspecified: Secondary | ICD-10-CM | POA: Diagnosis not present

## 2017-06-24 DIAGNOSIS — F411 Generalized anxiety disorder: Secondary | ICD-10-CM | POA: Insufficient documentation

## 2017-06-24 DIAGNOSIS — J449 Chronic obstructive pulmonary disease, unspecified: Secondary | ICD-10-CM | POA: Insufficient documentation

## 2017-06-24 DIAGNOSIS — M792 Neuralgia and neuritis, unspecified: Secondary | ICD-10-CM

## 2017-06-24 DIAGNOSIS — K219 Gastro-esophageal reflux disease without esophagitis: Secondary | ICD-10-CM | POA: Insufficient documentation

## 2017-06-24 DIAGNOSIS — G894 Chronic pain syndrome: Secondary | ICD-10-CM | POA: Insufficient documentation

## 2017-06-24 DIAGNOSIS — Z888 Allergy status to other drugs, medicaments and biological substances status: Secondary | ICD-10-CM | POA: Insufficient documentation

## 2017-06-24 DIAGNOSIS — B0229 Other postherpetic nervous system involvement: Secondary | ICD-10-CM | POA: Insufficient documentation

## 2017-06-24 DIAGNOSIS — M25551 Pain in right hip: Secondary | ICD-10-CM | POA: Diagnosis not present

## 2017-06-24 DIAGNOSIS — M25552 Pain in left hip: Secondary | ICD-10-CM | POA: Insufficient documentation

## 2017-06-24 DIAGNOSIS — F339 Major depressive disorder, recurrent, unspecified: Secondary | ICD-10-CM | POA: Diagnosis not present

## 2017-06-24 DIAGNOSIS — I69359 Hemiplegia and hemiparesis following cerebral infarction affecting unspecified side: Secondary | ICD-10-CM | POA: Insufficient documentation

## 2017-06-24 DIAGNOSIS — Z90710 Acquired absence of both cervix and uterus: Secondary | ICD-10-CM | POA: Insufficient documentation

## 2017-06-24 DIAGNOSIS — F17210 Nicotine dependence, cigarettes, uncomplicated: Secondary | ICD-10-CM | POA: Diagnosis not present

## 2017-06-24 DIAGNOSIS — M5136 Other intervertebral disc degeneration, lumbar region: Secondary | ICD-10-CM | POA: Diagnosis not present

## 2017-06-24 DIAGNOSIS — M797 Fibromyalgia: Secondary | ICD-10-CM | POA: Insufficient documentation

## 2017-06-24 DIAGNOSIS — M4322 Fusion of spine, cervical region: Secondary | ICD-10-CM | POA: Diagnosis not present

## 2017-06-24 DIAGNOSIS — Z9049 Acquired absence of other specified parts of digestive tract: Secondary | ICD-10-CM | POA: Insufficient documentation

## 2017-06-24 DIAGNOSIS — M47816 Spondylosis without myelopathy or radiculopathy, lumbar region: Secondary | ICD-10-CM | POA: Diagnosis not present

## 2017-06-24 DIAGNOSIS — Z5181 Encounter for therapeutic drug level monitoring: Secondary | ICD-10-CM | POA: Diagnosis not present

## 2017-06-24 DIAGNOSIS — M545 Low back pain: Secondary | ICD-10-CM | POA: Diagnosis not present

## 2017-06-24 DIAGNOSIS — Z79899 Other long term (current) drug therapy: Secondary | ICD-10-CM | POA: Insufficient documentation

## 2017-06-24 DIAGNOSIS — Z981 Arthrodesis status: Secondary | ICD-10-CM | POA: Diagnosis not present

## 2017-06-24 MED ORDER — GABAPENTIN 300 MG PO CAPS: 600.0000 mg | ORAL_CAPSULE | Freq: Three times a day (TID) | ORAL | 3 refills | Status: DC

## 2017-06-24 MED ORDER — DULOXETINE HCL 20 MG PO CPEP: 40.0000 mg | ORAL_CAPSULE | Freq: Every day | ORAL | 3 refills | Status: DC

## 2017-06-24 NOTE — Progress Notes (Signed)
Patient's Name: Beth [name]  MRN: [id number]  Referring Provider: Steele Sizer, MD  DOB: [birth year]  PCP: Steele Sizer, MD  DOS: 06/24/2017  Note by: [name], MD  Service setting: Ambulatory outpatient  Specialty: Interventional Pain Management  Location: [facility] (AMB) Pain Management Facility    Patient type: Established   Primary Reason(s) for Visit: Encounter for prescription drug management. (Level of risk: moderate)  CC: Back Pain (lower bilateral); Hip Pain (bilateral); and Leg Pain (bilateral)  HPI  Beth [name] is a 72 y.o. year old, female patient, who comes today for a medication management evaluation. She has Chronic LBP; Colon polyp; COPD, severe ([place]); CVA, old, hemiparesis ([place]); Dyslipidemia; Dysfunction of eustachian tube; Fibromyalgia syndrome; Gastro-esophageal reflux disease without esophagitis; Benign migrating glossitis; Cerebrovascular accident, old; H/O: pneumonia; Benign hypertension; IBS (irritable bowel syndrome); Low back pain with radiation; Chronic recurrent major depressive disorder ([place]); Dysmetabolic syndrome; Benign neoplasm of skin of trunk; OP (osteoporosis); Vitamin D deficiency; Marital problems; Migraine without aura and without status migrainosus, not intractable; Seasonal allergic rhinitis; Hyperglycemia; Senile purpura ([place]); Perennial allergic rhinitis with seasonal variation; Severe episode of recurrent major depressive disorder, without psychotic features ([place]); Moderate benzodiazepine use disorder ([place]); Major depressive disorder, recurrent, severe w/o psychotic behavior ([place]); Generalized anxiety disorder; Chronic neck and back pain; Protein-calorie malnutrition, mild ([place]); and Urinary tract infection on their problem list. Her primarily concern today is the Back Pain (lower bilateral); Hip Pain (bilateral); and Leg Pain (bilateral)  Pain Assessment: Location: Lower, Left, Right Back Radiating: into hips and legs Onset: More than a month  ago Duration: Chronic pain Quality: Discomfort, Aching, Other (Comment), Constant(extreme pain) Severity: 8 /10 (subjective, self-reported pain score)  Note: Reported level is inconsistent with clinical observations.                         When using our objective Pain Scale, levels between 6 and 10/10 are said to belong in an emergency room, as it progressively worsens from a 6/10, described as severely limiting, requiring emergency care not usually available at an outpatient pain management facility. At a 6/10 level, communication becomes difficult and requires great effort. Assistance to reach the emergency department may be required. Facial flushing and profuse sweating along with potentially dangerous increases in heart rate and blood pressure will be evident. Effect on ADL: unable to do as much as she used to  Timing: Constant Modifying factors: nothing currently BP: 108/81  HR: 87  Beth [name] was last scheduled for an appointment on 04/02/2017 for medication management. During today's appointment we reviewed Beth [name]'s chronic pain status, as well as her outpatient medication regimen.  Patient presents today for medication management.  Is endorsing pain in the back of her calf along with intermittent numbness on the anterior portion.  No falls or traumatic events in the interim.  Is taking gabapentin 600 mg 4 times a day.  The patient  reports that she does not use drugs. Her body mass index is 19.64 kg/m.  Further details on both, my assessment(s), as well as the proposed treatment plan, please see below.   Laboratory Chemistry  Inflammation Markers (CRP: Acute Phase) (ESR: Chronic Phase) Lab Results  Component Value Date   CRP 0.3 03/21/2016   ESRSEDRATE 1 03/21/2016   LATICACIDVEN 1.3 12/23/2015                         Rheumatology Markers No results found  for: Benay Spice, LYMEIGGIGMAB, LYMEABIGMQN, HLAB27                      Renal Function  Markers Lab Results  Component Value Date   BUN 18 01/04/2017   CREATININE 0.79 01/04/2017   BCR 12 03/23/2015   GFRAA >60 01/04/2017   GFRNONAA >60 01/04/2017                              Hepatic Function Markers Lab Results  Component Value Date   AST 23 01/04/2017   ALT 34 01/04/2017   ALBUMIN 3.1 (L) 01/04/2017   ALKPHOS 80 01/04/2017   LIPASE 27 02/13/2016                        Electrolytes Lab Results  Component Value Date   NA 140 01/04/2017   K 4.2 01/04/2017   CL 104 01/04/2017   CALCIUM 8.6 (L) 01/04/2017   MG 1.8 12/25/2012                        Neuropathy Markers Lab Results  Component Value Date   HGBA1C 5.0 07/15/2016                        Bone Pathology Markers Lab Results  Component Value Date   VD25OH 37 03/21/2016                         Coagulation Parameters Lab Results  Component Value Date   PLT 232 01/04/2017                        Cardiovascular Markers Lab Results  Component Value Date   CKTOTAL 37 (L) 12/05/2016   TROPONINI <0.03 11/15/2016   HGB 13.3 01/04/2017   HCT 39.2 01/04/2017                         CA Markers No results found for: CEA, CA125, LABCA2                      Note: Lab results reviewed.  Recent Diagnostic Imaging Results  DG Si Joints CLINICAL DATA:  Low back pain.  EXAM: BILATERAL SACROILIAC JOINTS - 3+ VIEW  COMPARISON:  None.  FINDINGS: The sacroiliac joint spaces are maintained and there is no evidence of arthropathy. No acute bone abnormalities are seen. Degenerative disc disease noted in the lower lumbar spine.  IMPRESSION: No acute bony abnormality.  Electronically Signed   By: Rolm Baptise M.D.   On: 02/24/2017 17:52 DG Lumbar Spine Complete W/Bend CLINICAL DATA:  Chronic bilateral low back pain  EXAM: LUMBAR SPINE - COMPLETE WITH BENDING VIEWS  COMPARISON:  CT 02/13/2016 and 12/23/2015  FINDINGS: Degenerative disc disease changes at L5-S1 with disc space narrowing,  vacuum disc and spurring. Mild disc space narrowing and spurring at L2-3 and L3-4. Mild degenerative facet disease from L2-3 through L5-S1, most pronounced at L5-S1. Slight retrolisthesis of L2 on L3 and L3 on L4 of approximately 3-4 mm on neutral images. No change with flexion or extension. Mild to moderate compression fracture through the superior endplate of D17, stable since prior abdominal CT and dating back to CT from 12/23/2015. No acute fracture. SI  joints are symmetric and unremarkable.  IMPRESSION: Degenerative disc and facet disease throughout the lumbar spine. No acute bony abnormality.  Mild to moderate compression fracture involving the superior endplate of F02, stable since recent abdominal CTs.  Slight retrolisthesis of L2 on L3 and L3 on L4 related to facet disease. No instability with flexion or extension.  Electronically Signed   By: Rolm Baptise M.D.   On: 02/24/2017 17:49  Complexity Note: Imaging results reviewed. Results shared with Beth [name], using [employer].                         Meds   Current Outpatient Medications:  .  acetaminophen (TYLENOL) 325 MG tablet, Take 650 mg by mouth every 4 (four) hours as needed., Disp: , Rfl:  .  ARIPiprazole (ABILIFY) 5 MG tablet, Take 5 mg by mouth daily., Disp: , Rfl:  .  aspirin 81 MG chewable tablet, Chew 81 mg daily by mouth., Disp: , Rfl:  .  atorvastatin (LIPITOR) 40 MG tablet, TAKE 1 TABLET(40 MG) BY MOUTH DAILY, Disp: 90 tablet, Rfl: 1 .  DULoxetine (CYMBALTA) 20 MG capsule, Take 2 capsules (40 mg total) by mouth daily., Disp: 60 capsule, Rfl: 3 .  fluticasone furoate-vilanterol (BREO ELLIPTA) 100-25 MCG/INH AEPB, Inhale 1 puff into the lungs daily., Disp: 60 each, Rfl: 5 .  gabapentin (NEURONTIN) 300 MG capsule, Take 2 capsules (600 mg total) by mouth 3 (three) times daily., Disp: 180 capsule, Rfl: 3 .  Ipratropium-Albuterol (COMBIVENT RESPIMAT) 20-100 MCG/ACT AERS respimat, INHALE 1 PUFF BY MOUTH  EVERY 6 HOURS. MUST LAST 3 MONTHS, Disp: 1 Inhaler, Rfl: 0 .  metoprolol succinate (TOPROL-XL) 25 MG 24 hr tablet, Take 0.5 tablets by mouth daily., Disp: , Rfl: 1 .  chlorhexidine (PERIDEX) 0.12 % solution, 30 mLs by Mouth Rinse route as needed., Disp: , Rfl: 1 .  Multiple Vitamin (THERA) TABS, Take by mouth., Disp: , Rfl:   ROS  Constitutional: Denies any fever or chills Gastrointestinal: No reported hemesis, hematochezia, vomiting, or acute GI distress Musculoskeletal: Denies any acute onset joint swelling, redness, loss of ROM, or weakness Neurological: No reported episodes of acute onset apraxia, aphasia, dysarthria, agnosia, amnesia, paralysis, loss of coordination, or loss of consciousness  Allergies  Beth [name] is allergic to bextra  [valdecoxib]; compazine  [prochlorperazine edisylate]; lithium carbonate; and lyrica [pregabalin].  Beth [name]  Drug: Beth [name]  reports that she does not use drugs. Alcohol:  reports that she does not drink alcohol. Tobacco:  reports that she has been smoking cigarettes.  She has a 35.00 pack-year smoking history. She has never used smokeless tobacco. Medical:  has a past medical history of Allergy, Anxiety, Asthma, Chronic pain syndrome, COPD (chronic obstructive pulmonary disease) ([place]), CVA (cerebral infarction), Depression, Fibromyalgia, Headache, Hyperlipidemia, Hypertension, IBS (irritable bowel syndrome), Stroke ([place]), and Vitamin D deficiency. Surgical: Beth [name]  has a past surgical history that includes Breast surgery (2011); Cervical discectomy; Appendectomy; Abdominal hysterectomy; Cholecystectomy; and Sinusotomy. Family: family history includes Alcohol abuse in her father; Anxiety disorder in her mother; Cancer in her father; Depression in her father and mother; Gallbladder disease in her father.  Constitutional Exam  General appearance: Well nourished, well developed, and well hydrated. In no apparent acute distress Vitals:   06/24/17  1115  BP: 108/81  Pulse: 87  Resp: 16  Temp: 98.1 F (36.7 C)  TempSrc: Oral  SpO2: 99%  Weight: 118 lb (53.5 kg)  Height: '5\' 5"'  (  1.651 m)   BMI Assessment: Estimated body mass index is 19.64 kg/m as calculated from the following:   Height as of this encounter: '5\' 5"'  (1.651 m).   Weight as of this encounter: 118 lb (53.5 kg).  BMI interpretation table: BMI level Category Range association with higher incidence of chronic pain  <18 kg/m2 Underweight   18.5-24.9 kg/m2 Ideal body weight   25-29.9 kg/m2 Overweight Increased incidence by 20%  30-34.9 kg/m2 Obese (Class I) Increased incidence by 68%  35-39.9 kg/m2 Severe obesity (Class II) Increased incidence by 136%  >40 kg/m2 Extreme obesity (Class III) Increased incidence by 254%   Patient's current BMI Ideal Body weight  Body mass index is 19.64 kg/m. Ideal body weight: 57 kg (125 lb 10.6 oz)   BMI Readings from Last 4 Encounters:  06/24/17 19.64 kg/m  03/27/17 19.80 kg/m  03/13/17 19.92 kg/m  03/06/17 19.40 kg/m   Wt Readings from Last 4 Encounters:  06/24/17 118 lb (53.5 kg)  03/27/17 119 lb (54 kg)  03/13/17 119 lb 11.2 oz (54.3 kg)  03/06/17 116 lb 9.6 oz (52.9 kg)  Psych/Mental status: Alert, oriented x 3 (person, place, & time)       Eyes: PERLA Respiratory: No evidence of acute respiratory distress  Cervical Spine Area Exam  Skin & Axial Inspection: No masses, redness, edema, swelling, or associated skin lesions Alignment: Symmetrical Functional ROM: Unrestricted ROM      Stability: No instability detected Muscle Tone/Strength: Functionally intact. No obvious neuro-muscular anomalies detected. Sensory (Neurological): Unimpaired Palpation: No palpable anomalies              Upper Extremity (UE) Exam    Side: Right upper extremity  Side: Left upper extremity  Skin & Extremity Inspection: Skin color, temperature, and hair growth are WNL. No peripheral edema or cyanosis. No masses, redness, swelling,  asymmetry, or associated skin lesions. No contractures.  Skin & Extremity Inspection: Skin color, temperature, and hair growth are WNL. No peripheral edema or cyanosis. No masses, redness, swelling, asymmetry, or associated skin lesions. No contractures.  Functional ROM: Unrestricted ROM          Functional ROM: Unrestricted ROM          Muscle Tone/Strength: Functionally intact. No obvious neuro-muscular anomalies detected.  Muscle Tone/Strength: Functionally intact. No obvious neuro-muscular anomalies detected.  Sensory (Neurological): Unimpaired          Sensory (Neurological): Unimpaired          Palpation: No palpable anomalies              Palpation: No palpable anomalies              Provocative Test(s):  Phalen's test: deferred Tinel's test: deferred Apley's scratch test (touch opposite shoulder):  Action 1 (Across chest): deferred Action 2 (Overhead): deferred Action 3 (LB reach): deferred   Provocative Test(s):  Phalen's test: deferred Tinel's test: deferred Apley's scratch test (touch opposite shoulder):  Action 1 (Across chest): deferred Action 2 (Overhead): deferred Action 3 (LB reach): deferred    Thoracic Spine Area Exam  Skin & Axial Inspection: No masses, redness, or swelling Alignment: Symmetrical Functional ROM: Unrestricted ROM Stability: No instability detected Muscle Tone/Strength: Functionally intact. No obvious neuro-muscular anomalies detected. Sensory (Neurological): Unimpaired Muscle strength & Tone: No palpable anomalies   Lumbar Spine Area Exam  Skin & Axial Inspection:No masses, redness, or swelling Alignment:Symmetrical Functional DPO:[id number] ROM Stability:No instability detected Muscle Tone/Strength:Functionally intact. No obvious neuro-muscular anomalies detected. Sensory (Neurological):Unimpaired  Palpation:No palpable anomalies Provocative Tests: Lumbar Hyperextension and rotation test:Positivebilaterally for facet  joint pain. Lumbar Lateral bending test:Positivedue to pain. Patrick's Maneuver:evaluation deferred today  Gait & Posture Assessment  Ambulation:Patient ambulates using a cane Gait:Antalgic Posture:Difficulty standing up straight, due to pain  Lower Extremity Exam    Side:Right lower extremity  Side:Left lower extremity  Skin & Extremity Inspection:Skin color, temperature, and hair growth are WNL. No peripheral edema or cyanosis. No masses, redness, swelling, asymmetry, or associated skin lesions. No contractures.  Skin & Extremity Inspection:Skin color, temperature, and hair growth are WNL. No peripheral edema or cyanosis. No masses, redness, swelling, asymmetry, or associated skin lesions. No contractures.  Functional XTK:[id number] ROM  Functional EQA:[id number] ROM  Muscle Tone/Strength:Functionally intact. No obvious neuro-muscular anomalies detected.  Muscle Tone/Strength:Functionally intact. No obvious neuro-muscular anomalies detected.  Sensory (Neurological):Neuropathic pain pattern  Sensory (Neurological):Unimpaired  Palpation:No palpable anomalies  Palpation:No palpable anomalies  5 out of 5 strength bilateral lower extremity: Plantar flexion, dorsiflexion, knee flexion, knee extension.  Assessment  Primary Diagnosis & Pertinent Problem List: The primary encounter diagnosis was Lumbar spondylosis. Diagnoses of Lumbar facet arthropathy, Lumbar degenerative disc disease, Chronic pain syndrome, Post herpetic neuralgia, S/P cervical spinal fusion, and Neuropathic pain were also pertinent to this visit.  Status Diagnosis  Having a Flare-up Persistent Controlled 1. Lumbar spondylosis   2. Lumbar facet arthropathy   3. Lumbar degenerative disc disease   4. Chronic pain syndrome   5. Post herpetic neuralgia   6. S/P cervical spinal fusion   7. Neuropathic pain      General Recommendations: The pain condition that  the patient suffers from is best treated with a multidisciplinary approach that involves an increase in physical activity to prevent de-conditioning and worsening of the pain cycle, as well as psychological counseling (formal and/or informal) to address the co-morbid psychological affects of pain. Treatment will often involve judicious use of pain medications and interventional procedures to decrease the pain, allowing the patient to participate in the physical activity that will ultimately produce long-lasting pain reductions. The goal of the multidisciplinary approach is to return the patient to a higher level of overall function and to restore their ability to perform activities of daily living.  72 year old female with a history of previous stroke, COPD, postherpetic neuralgia who presents with complaint of pain at her neck, low back, right hip and right leg. Patient does have a history of anterior cervical fusion at C5-C7 greater than 15 years ago. She does have associated neck pain and has difficulty with cervical extension. Her pain symptoms are consistent with cervical facet syndrome. Furthermore the patient also has axial low back pain that is secondary to lumbar degenerative disc disease, lumbar facet arthropathy and spondylosis. This is worsened over the last 5-10 years. Lumbar spine x-rays show facet arthropathy and L2 retrolisthesis of L3.  Patient also has lumbar degenerative disc disease.  Her SI joint x-rays show right greater than left SI joint arthritis which is mild upon my review.  Patient presents today for medication management.  Is endorsing leg swelling and pain in the back of her calf along with intermittent numbness on the anterior portion.  No falls or traumatic events in the interim.  Is taking gabapentin 600 mg 4 times a day.  We discussed decreasing her gabapentin to 600 mg 3 times a day to see if it helps out with her leg swelling.  I also recommended the patient follow-up  with her primary care physician for work-up of  possible DVT although my clinical suspicion of this is low.    MADOLINE BHATT has a history of greater than 3 months of moderate to severe pain which is resulted in functional impairment.  The patient has tried various conservative therapeutic options such as NSAIDs, Tylenol, muscle relaxants, physical therapy which was inadequately effective.  Patient's pain is predominantly axial with physical exam findings suggestive of facet arthropathy. Lumbar facet medial branch nerve blocks were discussed with the patient.  Risks and benefits were reviewed.  Patient would like to proceed with bilateral L3, L4, L5 medial branch nerve block.  Plan: -Diagnostic bilateral lumbar facet medial branch nerve blocks at L3, L4, L5 under fluoroscopy with sedation for lumbar spondylosis, lumbar facet arthropathy -Reduce gabapentin from 600 mg 4 times daily to 600 mg 3 times daily to see if lower extremity swelling improves -Continue Cymbalta 40 mg as prescribed  -continue home physical therapy exercises  Future considerations: Right SI joint injection    Plan of Care  Pharmacotherapy (Medications Ordered): Meds ordered this encounter  Medications  . gabapentin (NEURONTIN) 300 MG capsule    Sig: Take 2 capsules (600 mg total) by mouth 3 (three) times daily.    Dispense:  180 capsule    Refill:  3  . DULoxetine (CYMBALTA) 20 MG capsule    Sig: Take 2 capsules (40 mg total) by mouth daily.    Dispense:  60 capsule    Refill:  3   Lab-work, procedure(s), and/or referral(s): Orders Placed This Encounter  Procedures  . LUMBAR FACET(MEDIAL BRANCH NERVE BLOCK) MBNB   Time Note: Greater than 50% of the 25 minute(s) of face-to-face time spent with Beth [name], was spent in counseling/coordination of care regarding: Beth [name]'s primary cause of pain, the treatment plan, treatment alternatives, the risks and possible complications of proposed treatment and  realistic expectations.  Provider-requested follow-up: Return in about 3 weeks (around 07/15/2017) for Procedure.  Future Appointments  Date Time Provider [place]  06/26/2017 11:00 AM Steele Sizer, MD [facility]    Primary Care Physician: Steele Sizer, MD Location: [facility] Management Facility Note by: [name], M.D Date: 06/24/2017; Time: 2:37 PM  Patient Instructions  1. Reduce gabapentin to 400 mg three times a day 2. Refill cymbalta 3. Schedule for bilateral lumbar facets for low back pain 4. Follow up with PCP regarding calf pain and possible DVT work up____________________________________________________________________________________________  General Risks and Possible Complications  Patient Responsibilities: It is important that you read this as it is part of your informed consent. It is our duty to inform you of the risks and possible complications associated with treatments offered to you. It is your responsibility as a patient to read this and to ask questions about anything that is not clear or that you believe was not covered in this document.  Patient's Rights: You have the right to refuse treatment. You also have the right to change your mind, even after initially having agreed to have the treatment done. However, under this last option, if you wait until the last second to change your mind, you may be charged for the materials used up to that point.  Introduction: Medicine is not an Chief Strategy Officer. Everything in Medicine, including the lack of treatment(s), carries the potential for danger, harm, or loss (which is by definition: Risk). In Medicine, a complication is a secondary problem, condition, or disease that can aggravate an already existing one. All treatments carry the risk of possible complications. The  fact that a side effects or complications occurs, does not imply that the treatment was conducted incorrectly. It must be clearly  understood that these can happen even when everything is done following the highest safety standards.  No treatment: You can choose not to proceed with the proposed treatment alternative. The "PRO(s)" would include: avoiding the risk of complications associated with the therapy. The "CON(s)" would include: not getting any of the treatment benefits. These benefits fall under one of three categories: diagnostic; therapeutic; and/or palliative. Diagnostic benefits include: getting information which can ultimately lead to improvement of the disease or symptom(s). Therapeutic benefits are those associated with the successful treatment of the disease. Finally, palliative benefits are those related to the decrease of the primary symptoms, without necessarily curing the condition (example: decreasing the pain from a flare-up of a chronic condition, such as incurable terminal cancer).  General Risks and Complications: These are associated to most interventional treatments. They can occur alone, or in combination. They fall under one of the following six (6) categories: no benefit or worsening of symptoms; bleeding; infection; nerve damage; allergic reactions; and/or death. 1. No benefits or worsening of symptoms: In Medicine there are no guarantees, only probabilities. No healthcare provider can ever guarantee that a medical treatment will work, they can only state the probability that it may. Furthermore, there is always the possibility that the condition may worsen, either directly, or indirectly, as a consequence of the treatment. 2. Bleeding: This is more common if the patient is taking a blood thinner, either prescription or over the counter (example: Goody Powders, Fish oil, Aspirin, Garlic, etc.), or if suffering a condition associated with impaired coagulation (example: Hemophilia, cirrhosis of the liver, low platelet counts, etc.). However, even if you do not have one on these, it can still happen. If you have  any of these conditions, or take one of these drugs, make sure to notify your treating physician. 3. Infection: This is more common in patients with a compromised immune system, either due to disease (example: diabetes, cancer, human immunodeficiency virus [HIV], etc.), or due to medications or treatments (example: therapies used to treat cancer and rheumatological diseases). However, even if you do not have one on these, it can still happen. If you have any of these conditions, or take one of these drugs, make sure to notify your treating physician. 4. Nerve Damage: This is more common when the treatment is an invasive one, but it can also happen with the use of medications, such as those used in the treatment of cancer. The damage can occur to small secondary nerves, or to large primary ones, such as those in the spinal cord and brain. This damage may be temporary or permanent and it may lead to impairments that can range from temporary numbness to permanent paralysis and/or brain death. 5. Allergic Reactions: Any time a substance or material comes in contact with our body, there is the possibility of an allergic reaction. These can range from a mild skin rash (contact dermatitis) to a severe systemic reaction (anaphylactic reaction), which can result in death. 6. Death: In general, any medical intervention can result in death, most of the time due to an unforeseen complication. ____________________________________________________________________________________________  Facet Blocks Patient Information  Description: The facets are joints in the spine between the vertebrae.  Like any joints in the body, facets can become irritated and painful.  Arthritis can also effect the facets.  By injecting steroids and local anesthetic in and around these joints,  we can temporarily block the nerve supply to them.  Steroids act directly on irritated nerves and tissues to reduce selling and inflammation which often  leads to decreased pain.  Facet blocks may be done anywhere along the spine from the neck to the low back depending upon the location of your pain.   After numbing the skin with local anesthetic (like Novocaine), a small needle is passed onto the facet joints under x-ray guidance.  You may experience a sensation of pressure while this is being done.  The entire block usually lasts about 15-25 minutes.   Conditions which may be treated by facet blocks:   Low back/buttock pain  Neck/shoulder pain  Certain types of headaches  Preparation for the injection:  1. Do not eat any solid food or dairy products within 8 hours of your appointment. 2. You may drink clear liquid up to 3 hours before appointment.  Clear liquids include water, black coffee, juice or soda.  No milk or cream please. 3. You may take your regular medication, including pain medications, with a sip of water before your appointment.  Diabetics should hold regular insulin (if taken separately) and take 1/2 normal NPH dose the morning of the procedure.  Carry some sugar containing items with you to your appointment. 4. A driver must accompany you and be prepared to drive you home after your procedure. 5. Bring all your current medications with you. 6. An IV may be inserted and sedation may be given at the discretion of the physician. 7. A blood pressure cuff, EKG and other monitors will often be applied during the procedure.  Some patients may need to have extra oxygen administered for a short period. 8. You will be asked to provide medical information, including your allergies and medications, prior to the procedure.  We must know immediately if you are taking blood thinners (like Coumadin/Warfarin) or if you are allergic to IV iodine contrast (dye).  We must know if you could possible be pregnant.  Possible side-effects:   Bleeding from needle site  Infection (rare, may require surgery)  Nerve injury (rare)  Numbness &  tingling (temporary)  Difficulty urinating (rare, temporary)  Spinal headache (a headache worse with upright posture)  Light-headedness (temporary)  Pain at injection site (serveral days)  Decreased blood pressure (rare, temporary)  Weakness in arm/leg (temporary)  Pressure sensation in back/neck (temporary)   Call if you experience:   Fever/chills associated with headache or increased back/neck pain  Headache worsened by an upright position  New onset, weakness or numbness of an extremity below the injection site  Hives or difficulty breathing (go to the emergency room)  Inflammation or drainage at the injection site(s)  Severe back/neck pain greater than usual  New symptoms which are concerning to you  Please note:  Although the local anesthetic injected can often make your back or neck feel good for several hours after the injection, the pain will likely return. It takes 3-7 days for steroids to work.  You may not notice any pain relief for at least one week.  If effective, we will often do a series of 2-3 injections spaced 3-6 weeks apart to maximally decrease your pain.  After the initial series, you may be a candidate for a more permanent nerve block of the facets.  If you have any questions, please call #[phone]) [facility]

## 2017-06-24 NOTE — Telephone Encounter (Signed)
Pt.reports she has had swelling to both lower legs x 1 month. Swelling is behind the knee going down both legs. No redness - does have pain. Reports ambulating without difficulty. Offered appointment for tomorrow - Reports "I can't come in early in the morning - I'll come in Thursday." Appointment made for Thursday. States "Dr. Holley Raring said to get checked this week to be sure I don't have a blood clot." Instructed if pain increases, shortness of breath or chest pain - go to ED. Verbalizes understanding.  Reason for Disposition . [1] MODERATE leg swelling (e.g., swelling extends up to knees) AND [2] new onset or worsening  Answer Assessment - Initial Assessment Questions 1. ONSET: "When did the swelling start?" (e.g., minutes, hours, days)     Started 1 month ago 2. LOCATION: "What part of the leg is swollen?"  "Are both legs swollen or just one leg?"     Both legs swollen - behind knee and down the back 3. SEVERITY: "How bad is the swelling?" (e.g., localized; mild, moderate, severe)  - Localized - small area of swelling localized to one leg  - MILD pedal edema - swelling limited to foot and ankle, pitting edema < 1/4 inch (6 mm) deep, rest and elevation eliminate most or all swelling  - MODERATE edema - swelling of lower leg to knee, pitting edema > 1/4 inch (6 mm) deep, rest and elevation only partially reduce swelling  - SEVERE edema - swelling extends above knee, facial or hand swelling present      Moderate 4. REDNESS: "Does the swelling look red or infected?"     No 5. PAIN: "Is the swelling painful to touch?" If so, ask: "How painful is it?"   (Scale 1-10; mild, moderate or severe)     8 6. FEVER: "Do you have a fever?" If so, ask: "What is it, how was it measured, and when did it start?"      No 7. CAUSE: "What do you think is causing the leg swelling?"     Pain doctor said I should be checked for a blood clot 8. MEDICAL HISTORY: "Do you have a history of heart failure, kidney disease,  liver failure, or cancer?"     CVA - 4 years ago 45. RECURRENT SYMPTOM: "Have you had leg swelling before?" If so, ask: "When was the last time?" "What happened that time?"     No 10. OTHER SYMPTOMS: "Do you have any other symptoms?" (e.g., chest pain, difficulty breathing)       No 11. PREGNANCY: "Is there any chance you are pregnant?" "When was your last menstrual period?"       No  Protocols used: LEG SWELLING AND EDEMA-A-AH

## 2017-06-24 NOTE — Progress Notes (Signed)
Safety precautions to be maintained throughout the outpatient stay will include: orient to surroundings, keep bed in low position, maintain call bell within reach at all times, provide assistance with transfer out of bed and ambulation.   Patient here for medication management, reports swelling and weakness in legs and a feeling of losing her balance.  Denies any pain relief from her medication

## 2017-06-24 NOTE — Patient Instructions (Addendum)
1. Reduce gabapentin to 400 mg three times a day 2. Refill cymbalta 3. Schedule for bilateral lumbar facets for low back pain 4. Follow up with PCP regarding calf pain and possible DVT work up____________________________________________________________________________________________  General Risks and Possible Complications  Patient Responsibilities: It is important that you read this as it is part of your informed consent. It is our duty to inform you of the risks and possible complications associated with treatments offered to you. It is your responsibility as a patient to read this and to ask questions about anything that is not clear or that you believe was not covered in this document.  Patient's Rights: You have the right to refuse treatment. You also have the right to change your mind, even after initially having agreed to have the treatment done. However, under this last option, if you wait until the last second to change your mind, you may be charged for the materials used up to that point.  Introduction: Medicine is not an Chief Strategy Officer. Everything in Medicine, including the lack of treatment(s), carries the potential for danger, harm, or loss (which is by definition: Risk). In Medicine, a complication is a secondary problem, condition, or disease that can aggravate an already existing one. All treatments carry the risk of possible complications. The fact that a side effects or complications occurs, does not imply that the treatment was conducted incorrectly. It must be clearly understood that these can happen even when everything is done following the highest safety standards.  No treatment: You can choose not to proceed with the proposed treatment alternative. The "PRO(s)" would include: avoiding the risk of complications associated with the therapy. The "CON(s)" would include: not getting any of the treatment benefits. These benefits fall under one of three categories: diagnostic;  therapeutic; and/or palliative. Diagnostic benefits include: getting information which can ultimately lead to improvement of the disease or symptom(s). Therapeutic benefits are those associated with the successful treatment of the disease. Finally, palliative benefits are those related to the decrease of the primary symptoms, without necessarily curing the condition (example: decreasing the pain from a flare-up of a chronic condition, such as incurable terminal cancer).  General Risks and Complications: These are associated to most interventional treatments. They can occur alone, or in combination. They fall under one of the following six (6) categories: no benefit or worsening of symptoms; bleeding; infection; nerve damage; allergic reactions; and/or death. 1. No benefits or worsening of symptoms: In Medicine there are no guarantees, only probabilities. No healthcare provider can ever guarantee that a medical treatment will work, they can only state the probability that it may. Furthermore, there is always the possibility that the condition may worsen, either directly, or indirectly, as a consequence of the treatment. 2. Bleeding: This is more common if the patient is taking a blood thinner, either prescription or over the counter (example: Goody Powders, Fish oil, Aspirin, Garlic, etc.), or if suffering a condition associated with impaired coagulation (example: Hemophilia, cirrhosis of the liver, low platelet counts, etc.). However, even if you do not have one on these, it can still happen. If you have any of these conditions, or take one of these drugs, make sure to notify your treating physician. 3. Infection: This is more common in patients with a compromised immune system, either due to disease (example: diabetes, cancer, human immunodeficiency virus [HIV], etc.), or due to medications or treatments (example: therapies used to treat cancer and rheumatological diseases). However, even if you do not have  one on these, it can still happen. If you have any of these conditions, or take one of these drugs, make sure to notify your treating physician. 4. Nerve Damage: This is more common when the treatment is an invasive one, but it can also happen with the use of medications, such as those used in the treatment of cancer. The damage can occur to small secondary nerves, or to large primary ones, such as those in the spinal cord and brain. This damage may be temporary or permanent and it may lead to impairments that can range from temporary numbness to permanent paralysis and/or brain death. 5. Allergic Reactions: Any time a substance or material comes in contact with our body, there is the possibility of an allergic reaction. These can range from a mild skin rash (contact dermatitis) to a severe systemic reaction (anaphylactic reaction), which can result in death. 6. Death: In general, any medical intervention can result in death, most of the time due to an unforeseen complication. ____________________________________________________________________________________________  Facet Blocks Patient Information  Description: The facets are joints in the spine between the vertebrae.  Like any joints in the body, facets can become irritated and painful.  Arthritis can also effect the facets.  By injecting steroids and local anesthetic in and around these joints, we can temporarily block the nerve supply to them.  Steroids act directly on irritated nerves and tissues to reduce selling and inflammation which often leads to decreased pain.  Facet blocks may be done anywhere along the spine from the neck to the low back depending upon the location of your pain.   After numbing the skin with local anesthetic (like Novocaine), a small needle is passed onto the facet joints under x-ray guidance.  You may experience a sensation of pressure while this is being done.  The entire block usually lasts about 15-25 minutes.    Conditions which may be treated by facet blocks:   Low back/buttock pain  Neck/shoulder pain  Certain types of headaches  Preparation for the injection:  1. Do not eat any solid food or dairy products within 8 hours of your appointment. 2. You may drink clear liquid up to 3 hours before appointment.  Clear liquids include water, black coffee, juice or soda.  No milk or cream please. 3. You may take your regular medication, including pain medications, with a sip of water before your appointment.  Diabetics should hold regular insulin (if taken separately) and take 1/2 normal NPH dose the morning of the procedure.  Carry some sugar containing items with you to your appointment. 4. A driver must accompany you and be prepared to drive you home after your procedure. 5. Bring all your current medications with you. 6. An IV may be inserted and sedation may be given at the discretion of the physician. 7. A blood pressure cuff, EKG and other monitors will often be applied during the procedure.  Some patients may need to have extra oxygen administered for a short period. 8. You will be asked to provide medical information, including your allergies and medications, prior to the procedure.  We must know immediately if you are taking blood thinners (like Coumadin/Warfarin) or if you are allergic to IV iodine contrast (dye).  We must know if you could possible be pregnant.  Possible side-effects:   Bleeding from needle site  Infection (rare, may require surgery)  Nerve injury (rare)  Numbness & tingling (temporary)  Difficulty urinating (rare, temporary)  Spinal headache (a headache worse  with upright posture)  Light-headedness (temporary)  Pain at injection site (serveral days)  Decreased blood pressure (rare, temporary)  Weakness in arm/leg (temporary)  Pressure sensation in back/neck (temporary)   Call if you experience:   Fever/chills associated with headache or increased  back/neck pain  Headache worsened by an upright position  New onset, weakness or numbness of an extremity below the injection site  Hives or difficulty breathing (go to the emergency room)  Inflammation or drainage at the injection site(s)  Severe back/neck pain greater than usual  New symptoms which are concerning to you  Please note:  Although the local anesthetic injected can often make your back or neck feel good for several hours after the injection, the pain will likely return. It takes 3-7 days for steroids to work.  You may not notice any pain relief for at least one week.  If effective, we will often do a series of 2-3 injections spaced 3-6 weeks apart to maximally decrease your pain.  After the initial series, you may be a candidate for a more permanent nerve block of the facets.  If you have any questions, please call #[phone]) [facility]

## 2017-06-26 ENCOUNTER — Ambulatory Visit (INDEPENDENT_AMBULATORY_CARE_PROVIDER_SITE_OTHER): Payer: Medicare HMO | Admitting: Family Medicine

## 2017-06-26 ENCOUNTER — Telehealth: Payer: Self-pay | Admitting: Student in an Organized Health Care Education/Training Program

## 2017-06-26 ENCOUNTER — Encounter: Payer: Self-pay | Admitting: Family Medicine

## 2017-06-26 VITALS — BP 114/68 | HR 96 | Temp 98.3°F | Resp 16 | Ht 65.0 in | Wt 130.5 lb

## 2017-06-26 DIAGNOSIS — R1310 Dysphagia, unspecified: Secondary | ICD-10-CM | POA: Diagnosis not present

## 2017-06-26 DIAGNOSIS — F339 Major depressive disorder, recurrent, unspecified: Secondary | ICD-10-CM | POA: Diagnosis not present

## 2017-06-26 DIAGNOSIS — B001 Herpesviral vesicular dermatitis: Secondary | ICD-10-CM | POA: Diagnosis not present

## 2017-06-26 DIAGNOSIS — D692 Other nonthrombocytopenic purpura: Secondary | ICD-10-CM

## 2017-06-26 DIAGNOSIS — I10 Essential (primary) hypertension: Secondary | ICD-10-CM | POA: Diagnosis not present

## 2017-06-26 DIAGNOSIS — R739 Hyperglycemia, unspecified: Secondary | ICD-10-CM | POA: Diagnosis not present

## 2017-06-26 DIAGNOSIS — E441 Mild protein-calorie malnutrition: Secondary | ICD-10-CM

## 2017-06-26 DIAGNOSIS — N183 Chronic kidney disease, stage 3 unspecified: Secondary | ICD-10-CM

## 2017-06-26 DIAGNOSIS — E785 Hyperlipidemia, unspecified: Secondary | ICD-10-CM

## 2017-06-26 DIAGNOSIS — J42 Unspecified chronic bronchitis: Secondary | ICD-10-CM | POA: Diagnosis not present

## 2017-06-26 DIAGNOSIS — I69359 Hemiplegia and hemiparesis following cerebral infarction affecting unspecified side: Secondary | ICD-10-CM

## 2017-06-26 DIAGNOSIS — R69 Illness, unspecified: Secondary | ICD-10-CM | POA: Diagnosis not present

## 2017-06-26 DIAGNOSIS — R1013 Epigastric pain: Secondary | ICD-10-CM | POA: Diagnosis not present

## 2017-06-26 MED ORDER — VALACYCLOVIR HCL 1 G PO TABS: 1000.0000 mg | ORAL_TABLET | Freq: Two times a day (BID) | ORAL | 0 refills | Status: DC

## 2017-06-26 NOTE — Telephone Encounter (Signed)
Pharmacy lvmail stating patient's insurance does not want to pay for meds as prescribed, they have alternate way to fill. Please call to verify [phone]

## 2017-06-26 NOTE — Telephone Encounter (Signed)
Spoke with pharmacy, the problem is that 300 mg capsules were prescribed for 600 mg tid dosage.  Insurance would not approve 300 mg capsules but would approve the 600 mg qty 90.  Verbal given to approve this.  Record reviewed and this is a decrease in her previous dosage of 600 mg qid and there is no titration schedule in place that would require a 300 mg capsule.

## 2017-06-26 NOTE — Progress Notes (Signed)
Name: Beth [name]   MRN: [id number]    DOB: 1945/07/01   Date:06/26/2017       Progress Note  Subjective  Chief Complaint  Chief Complaint  Patient presents with  . Edema    Onset- past couple of weeks at the bottom of her legs-SOB and wheezing a little bit.  . Mouth Lesions    HPI  Major Depression:last admission06/16/2018 admitted for suicide prevention at [facility]. She is now under the care of Dr. Loni Muse at [place] [place]. She is compliant with medication, anxiety seems under control today  COPD severe: she has been compliant with Breo and  takes Combivent prn.She had quit smoking in 2014 but has been smoking again daily up to 3/4  pack day, she states she resumed smoking because of depression, she states it is a distraction. She has a chronic cough, most days, decrease in exercise tolerance, and occasional wheezing. She states sputum is clear/ slimy and unchanged.    HTN: taking metoprolol for bp and migraine prevention. Tremors much better, finally off BZD and seems to be doing well at this time. She has intermittent chest pain usually when she feels anxious.   History of CVA with left side hemiparesis: doing much better, very mild symptoms now. Taking aspirin and statin therapy, she is still slightly weak on left leg.  Right hip pain she is now seeing Dr. Holley Raring for chronic pain management, taking gabapentin and duloxetine and seems to be controlling symptoms. Pain level at this time is 4/03  Metabolic Syndrome: she does not have DM, only metabolic syndrome. Last hgbA1C was 5.7%, down to 5.0 % in 2018 . She denies polyphagia, polydipsia or polyuria.We will recheck labs today   Dyslipidemia: reminded her of life style modification   Weight loss/malnutrtion: she is doing better, anxiety seems to be better controlled and weight is up to 130 lbs today. She was down to 112 lbs one year ago. Continue healthy diet   Right leg swelling; she states it is better now, but it was  worse during visit to pain clinic and he advised her to come in to see me. No calf tenderness, edema is symmetrical , no redness or increase in warmth  Patient Active Problem List   Diagnosis Date Noted  . Urinary tract infection 12/05/2016  . Protein-calorie malnutrition, mild ([place]) 08/07/2016  . Generalized anxiety disorder 07/15/2016  . Chronic neck and back pain 07/15/2016  . Severe episode of recurrent major depressive disorder, without psychotic features ([place]) 07/14/2016  . Moderate benzodiazepine use disorder ([place]) 05/08/2016  . Major depressive disorder, recurrent, severe w/o psychotic behavior ([place]) 05/01/2016  . Seasonal allergic rhinitis 03/23/2015  . Hyperglycemia 03/23/2015  . Senile purpura ([place]) 03/23/2015  . Perennial allergic rhinitis with seasonal variation 03/23/2015  . Marital problems 10/31/2014  . Migraine without aura and without status migrainosus, not intractable 10/31/2014  . Chronic LBP 08/09/2014  . Colon polyp 08/09/2014  . COPD, severe ([place]) 08/09/2014  . CVA, old, hemiparesis ([place]) 08/09/2014  . Dyslipidemia 08/09/2014  . Dysfunction of eustachian tube 08/09/2014  . Fibromyalgia syndrome 08/09/2014  . Gastro-esophageal reflux disease without esophagitis 08/09/2014  . Benign migrating glossitis 08/09/2014  . Cerebrovascular accident, old 08/09/2014  . IBS (irritable bowel syndrome) 08/09/2014  . Low back pain with radiation 08/09/2014  . Chronic recurrent major depressive disorder ([place]) 08/09/2014  . Dysmetabolic syndrome 47/42/5956  . OP (osteoporosis) 08/09/2014  . Vitamin D deficiency 08/09/2014  . Benign hypertension 07/19/2013  . Benign  neoplasm of skin of trunk 06/03/2013  . H/O: pneumonia 09/25/2012    Past Surgical History:  Procedure Laterality Date  . ABDOMINAL HYSTERECTOMY    . APPENDECTOMY    . BREAST SURGERY  2011   biopsy  . CERVICAL DISCECTOMY    . CHOLECYSTECTOMY    . SINUSOTOMY      Family History  Problem Relation Age  of Onset  . Anxiety disorder Mother   . Depression Mother   . Cancer Father   . Gallbladder disease Father   . Alcohol abuse Father   . Depression Father     Social History   Socioeconomic History  . Marital status: Married    Spouse name: Not on file  . Number of children: 2  . Years of education: Not on file  . Highest education level: Not on file  Occupational History  . Not on file  Social Needs  . Financial resource strain: Very hard  . Food insecurity:    Worry: Never true    Inability: Never true  . Transportation needs:    Medical: No    Non-medical: No  Tobacco Use  . Smoking status: Current Some Day Smoker    Packs/day: 0.75    Years: 35.00    Pack years: 26.25    Types: Cigarettes  . Smokeless tobacco: Never Used  Substance and Sexual Activity  . Alcohol use: No    Alcohol/week: 0.0 oz  . Drug use: No  . Sexual activity: Never  Lifestyle  . Physical activity:    Days per week: 0 days    Minutes per session: 0 min  . Stress: Very much  Relationships  . Social connections:    Talks on phone: Not on file    Gets together: Not on file    Attends religious service: Not on file    Active member of club or organization: Not on file    Attends meetings of clubs or organizations: Not on file    Relationship status: Not on file  . Intimate partner violence:    Fear of current or ex partner: Not on file    Emotionally abused: Not on file    Physically abused: Not on file    Forced sexual activity: Not on file  Other Topics Concern  . Not on file  Social History Narrative  . Not on file     Current Outpatient Medications:  .  acetaminophen (TYLENOL) 325 MG tablet, Take 650 mg by mouth every 4 (four) hours as needed., Disp: , Rfl:  .  ARIPiprazole (ABILIFY) 5 MG tablet, Take 5 mg by mouth daily., Disp: , Rfl:  .  aspirin 81 MG chewable tablet, Chew 81 mg daily by mouth., Disp: , Rfl:  .  atorvastatin (LIPITOR) 40 MG tablet, TAKE 1 TABLET(40 MG) BY  MOUTH DAILY, Disp: 90 tablet, Rfl: 1 .  chlorhexidine (PERIDEX) 0.12 % solution, 30 mLs by Mouth Rinse route as needed., Disp: , Rfl: 1 .  DULoxetine (CYMBALTA) 20 MG capsule, Take 2 capsules (40 mg total) by mouth daily., Disp: 60 capsule, Rfl: 3 .  fluticasone furoate-vilanterol (BREO ELLIPTA) 100-25 MCG/INH AEPB, Inhale 1 puff into the lungs daily., Disp: 60 each, Rfl: 5 .  gabapentin (NEURONTIN) 300 MG capsule, Take 2 capsules (600 mg total) by mouth 3 (three) times daily., Disp: 180 capsule, Rfl: 3 .  Ipratropium-Albuterol (COMBIVENT RESPIMAT) 20-100 MCG/ACT AERS respimat, INHALE 1 PUFF BY MOUTH EVERY 6 HOURS. MUST LAST 3 MONTHS, Disp:  1 Inhaler, Rfl: 0 .  metoprolol succinate (TOPROL-XL) 25 MG 24 hr tablet, Take 0.5 tablets by mouth daily., Disp: , Rfl: 1 .  Multiple Vitamin (THERA) TABS, Take by mouth., Disp: , Rfl:   Allergies  Allergen Reactions  . Bextra  [Valdecoxib]   . Compazine  [Prochlorperazine Edisylate]   . Lithium Carbonate   . Lyrica [Pregabalin]      ROS  Constitutional: Negative for fever or weight change.  Respiratory: positive  for cough and shortness of breath.   Cardiovascular: positive  for intermittent chest pain and  palpitations.  Gastrointestinal: Negative for abdominal pain, no bowel changes.  Musculoskeletal: Negative for gait problem or joint swelling.  Skin: Negative for rash.  Neurological: positive  for intermittent dizziness and  headache.  No other specific complaints in a complete review of systems (except as listed in HPI above).  Objective  Vitals:   06/26/17 1120  BP: 114/68  Pulse: 96  Resp: 16  Temp: 98.3 F (36.8 C)  TempSrc: Oral  SpO2: 91%  Weight: 130 lb 8 oz (59.2 kg)  Height: 5\' 5"  (1.651 m)    Body mass index is 21.72 kg/m.  Physical Exam  Constitutional: Patient appears well-developed No distress.  HEENT: head atraumatic, normocephalic, pupils equal and reactive to lightneck supple, throat within normal  limits Cardiovascular: Normal rate, regular rhythm and normal heart sounds.  No murmur heard. Trace  BLE edema. Pulmonary/Chest: Effort normal and breath sounds normal. No respiratory distress. Abdominal: Soft.  There is no tenderness. Skin: doing better today, no purpura today, but happens at home and observed in our office in the past  Psychiatric: Patient has a normal mood and affect. behavior is normal. Judgment and thought content normal.  PHQ2/9: Depression screen [facility] 2/9 06/26/2017 03/27/2017 03/13/2017 02/24/2017 02/18/2017  Decreased Interest 2 0 3 1 3   Down, Depressed, Hopeless 3 0 3 1 3   PHQ - 2 Score 5 0 6 2 6   Altered sleeping 3 - 3 1 3   Tired, decreased energy 2 - 3 1 -  Change in appetite 1 - 1 1 3   Feeling bad or failure about yourself  2 - 3 1 2   Trouble concentrating 1 - 2 1 2   Moving slowly or fidgety/restless 0 - 3 1 3   Suicidal thoughts 0 - 0 0 1  PHQ-9 Score 14 - 21 8 20   Difficult doing work/chores Somewhat difficult - Very difficult - Extremely dIfficult  Some recent data might be hidden     Fall Risk: Fall Risk  06/26/2017 06/24/2017 03/27/2017 03/13/2017 02/24/2017  Falls in the past year? No No No Yes No  Number falls in past yr: - - - 2 or more 2 or more  Injury with Fall? - - - No No     Functional Status Survey: Is the patient deaf or have difficulty hearing?: No Does the patient have difficulty seeing, even when wearing glasses/contacts?: No Does the patient have difficulty concentrating, remembering, or making decisions?: No Does the patient have difficulty walking or climbing stairs?: Yes Does the patient have difficulty dressing or bathing?: No Does the patient have difficulty doing errands alone such as visiting a doctor's office or shopping?: Yes(Doesn't like to drive long distance )    Assessment & Plan  1. Benign hypertension  - COMPLETE METABOLIC PANEL WITH GFR - CBC with Differential/Platelet  2. Chronic bronchitis, unspecified chronic  bronchitis type ([place])  Discussed importance of quitting smoking, needs refills of combivent  3. CVA, old, hemiparesis ([place])  Stable, still drags the left foot a little  4. Dyslipidemia  - Lipid panel  5. Senile purpura (HCC)  Stable  6. Chronic recurrent major depressive disorder [facility])  Seeing psychiatrist, taking medications as recommended   7. Mild protein-calorie malnutrition (HCC)  Albumin was low, lost a lot of weight last year, but gaining it back now, we will monitor, still advised her to take protein shake, she is currently taking Ensure every morning.   8. Hyperglycemia  - Hemoglobin A1c  9. Fever blister  - valACYclovir (VALTREX) 1000 MG tablet; Take 1 tablet (1,000 mg total) by mouth 2 (two) times daily.  Dispense: 10 tablet; Refill: 0  10. CKD (chronic kidney disease), stage III ([place])  Recheck labs, the value has varied and no recent labs, we will recheck it today and adjust diagnosis based on results  - COMPLETE METABOLIC PANEL WITH GFR

## 2017-06-27 LAB — COMPLETE METABOLIC PANEL WITH GFR
AG RATIO: 1.6 (calc) (ref 1.0–2.5)
ALKALINE PHOSPHATASE (APISO): 75 U/L (ref 33–130)
ALT: 9 U/L (ref 6–29)
AST: 14 U/L (ref 10–35)
Albumin: 3.8 g/dL (ref 3.6–5.1)
BILIRUBIN TOTAL: 0.5 mg/dL (ref 0.2–1.2)
BUN: 21 mg/dL (ref 7–25)
CHLORIDE: 107 mmol/L (ref 98–110)
CO2: 28 mmol/L (ref 20–32)
Calcium: 9.2 mg/dL (ref 8.6–10.4)
Creat: 0.92 mg/dL (ref 0.60–0.93)
GFR, Est African American: 72 mL/min/{1.73_m2} (ref >=60)
GFR, Est Non African American: 62 mL/min/{1.73_m2} (ref >=60)
GLOBULIN: 2.4 g/dL (ref 1.9–3.7)
Glucose, Bld: 93 mg/dL (ref 65–139)
Potassium: 3.9 mmol/L (ref 3.5–5.3)
SODIUM: 142 mmol/L (ref 135–146)
Total Protein: 6.2 g/dL (ref 6.1–8.1)

## 2017-06-27 LAB — CBC WITH DIFFERENTIAL/PLATELET
BASOS PCT: 0.5 %
Basophils Absolute: 50 cells/uL (ref 0–200)
EOS ABS: 250 {cells}/uL (ref 15–500)
Eosinophils Relative: 2.5 %
HCT: 38.7 % (ref 35.0–45.0)
Hemoglobin: 13.1 g/dL (ref 11.7–15.5)
Lymphs Abs: 2030 cells/uL (ref 850–3900)
MCH: 31 pg (ref 27.0–33.0)
MCHC: 33.9 g/dL (ref 32.0–36.0)
MCV: 91.5 fL (ref 80.0–100.0)
MONOS PCT: 8.1 %
MPV: 9.6 fL (ref 7.5–12.5)
Neutro Abs: 6860 cells/uL (ref 1500–7800)
Neutrophils Relative %: 68.6 %
Platelets: 214 10*3/uL (ref 140–400)
RBC: 4.23 10*6/uL (ref 3.80–5.10)
RDW: 13.6 % (ref 11.0–15.0)
Total Lymphocyte: 20.3 %
WBC mixed population: 810 cells/uL (ref 200–950)
WBC: 10 10*3/uL (ref 3.8–10.8)

## 2017-06-27 LAB — LIPID PANEL
CHOL/HDL RATIO: 2.3 (calc) (ref <5.0)
CHOLESTEROL: 131 mg/dL (ref <200)
HDL: 57 mg/dL (ref >50)
LDL CHOLESTEROL (CALC): 61 mg/dL
NON-HDL CHOLESTEROL (CALC): 74 mg/dL (ref <130)
Triglycerides: 46 mg/dL (ref <150)

## 2017-06-27 LAB — H. PYLORI BREATH TEST: H. PYLORI BREATH TEST: NOT DETECTED

## 2017-06-27 LAB — HEMOGLOBIN A1C
EAG (MMOL/L): 5.8 (calc)
Hgb A1c MFr Bld: 5.3 % of total Hgb (ref <5.7)
MEAN PLASMA GLUCOSE: 105 (calc)

## 2017-07-03 ENCOUNTER — Telehealth: Payer: Self-pay | Admitting: Family Medicine

## 2017-07-03 NOTE — Telephone Encounter (Signed)
Copied from [place] [id number]. Topic: Quick Communication - See Telephone Encounter >> Jul 03, 2017 11:45 AM Vernona Rieger wrote: CRM for notification. See Telephone encounter for: 07/03/17.  Patient is requesting her lab results from 5/30. Please call back at [phone]

## 2017-07-03 NOTE — Telephone Encounter (Signed)
Left a message to have patient to call back regarding her labs.

## 2017-07-09 DIAGNOSIS — R69 Illness, unspecified: Secondary | ICD-10-CM | POA: Diagnosis not present

## 2017-07-11 ENCOUNTER — Ambulatory Visit: Payer: Self-pay | Admitting: *Deleted

## 2017-07-11 NOTE — Telephone Encounter (Signed)
Pt reports runny nose (clear drainage) teary eyes and "Mild" sore throat x 2 weeks.  Also reports "sores" in nose, both nares. States mild tenderness around sinus areas. Denies fever, cough, congestion. Pt states she has "seasonal allergies." Has been using OTC benadryl, Flonase; states is now out of Flonase. Denies any SOB, dizziness. Unable to make it to appt today, declined Saturday [facility]. Appt made for Monday with E. Poulose. Care advise gvien per protocol.  Instructed pt to go to UC/Ed if symptoms worsen, fever, SOB present. Pt verbalizes understanding. Reason for Disposition . [1] Taking antihistamines > 2 days AND [2] nasal allergy symptoms interfere with sleep, school, or work  Answer Assessment - Initial Assessment Questions 1. SYMPTOM: "What's the main symptom you're concerned about?" (e.g., runny nose, stuffiness, sneezing, itching)    Runny nose, clear drainage 2. SEVERITY: "How bad is it?" "What does it keep you from doing?" (e.g., sleeping, working)     moderate 3. EYES: "Are the eyes also red, watery, and itchy?"     watery 4. TRIGGER: "What pollen or other allergic substance do you think is causing the symptoms?"      unsure 5. TREATMENT: "What medicine are you using?" "What medicine worked best in the past?"     Flonase, OTC benadryl 6. OTHER SYMPTOMS: "Do you have any other symptoms?" (e.g., coughing, difficulty breathing, wheezing)     Sore in nose, both nares, mild tenderness at sinus area, mild sore throat.  Protocols used: NASAL ALLERGIES (HAY FEVER)-A-AH

## 2017-07-14 ENCOUNTER — Ambulatory Visit: Payer: Self-pay | Admitting: Nurse Practitioner

## 2017-07-15 ENCOUNTER — Ambulatory Visit (INDEPENDENT_AMBULATORY_CARE_PROVIDER_SITE_OTHER): Payer: Medicare HMO | Admitting: Nurse Practitioner

## 2017-07-15 ENCOUNTER — Emergency Department
Admission: EM | Admit: 2017-07-15 | Discharge: 2017-07-15 | Disposition: A | Discharge status: Home / Self Care | Payer: Medicare HMO | Attending: Emergency Medicine | Admitting: Emergency Medicine

## 2017-07-15 ENCOUNTER — Encounter: Payer: Self-pay | Admitting: Medical Oncology

## 2017-07-15 ENCOUNTER — Emergency Department: Payer: Medicare HMO

## 2017-07-15 ENCOUNTER — Other Ambulatory Visit: Payer: Self-pay

## 2017-07-15 ENCOUNTER — Ambulatory Visit: Payer: Self-pay | Admitting: Nurse Practitioner

## 2017-07-15 ENCOUNTER — Encounter: Payer: Self-pay | Admitting: Nurse Practitioner

## 2017-07-15 VITALS — BP 124/70 | HR 100 | Temp 98.8°F | Resp 16 | Ht 65.0 in | Wt 127.5 lb

## 2017-07-15 DIAGNOSIS — H532 Diplopia: Secondary | ICD-10-CM | POA: Diagnosis not present

## 2017-07-15 DIAGNOSIS — N390 Urinary tract infection, site not specified: Secondary | ICD-10-CM

## 2017-07-15 DIAGNOSIS — R471 Dysarthria and anarthria: Secondary | ICD-10-CM | POA: Diagnosis not present

## 2017-07-15 DIAGNOSIS — R531 Weakness: Secondary | ICD-10-CM

## 2017-07-15 DIAGNOSIS — R4781 Slurred speech: Secondary | ICD-10-CM | POA: Insufficient documentation

## 2017-07-15 DIAGNOSIS — Z8673 Personal history of transient ischemic attack (TIA), and cerebral infarction without residual deficits: Secondary | ICD-10-CM | POA: Diagnosis not present

## 2017-07-15 DIAGNOSIS — Z79899 Other long term (current) drug therapy: Secondary | ICD-10-CM | POA: Insufficient documentation

## 2017-07-15 DIAGNOSIS — I10 Essential (primary) hypertension: Secondary | ICD-10-CM | POA: Insufficient documentation

## 2017-07-15 DIAGNOSIS — J45909 Unspecified asthma, uncomplicated: Secondary | ICD-10-CM | POA: Diagnosis not present

## 2017-07-15 DIAGNOSIS — J449 Chronic obstructive pulmonary disease, unspecified: Secondary | ICD-10-CM | POA: Diagnosis not present

## 2017-07-15 DIAGNOSIS — F17210 Nicotine dependence, cigarettes, uncomplicated: Secondary | ICD-10-CM | POA: Insufficient documentation

## 2017-07-15 DIAGNOSIS — J3489 Other specified disorders of nose and nasal sinuses: Secondary | ICD-10-CM | POA: Diagnosis not present

## 2017-07-15 DIAGNOSIS — R1310 Dysphagia, unspecified: Secondary | ICD-10-CM | POA: Diagnosis not present

## 2017-07-15 DIAGNOSIS — Z7982 Long term (current) use of aspirin: Secondary | ICD-10-CM | POA: Diagnosis not present

## 2017-07-15 DIAGNOSIS — R69 Illness, unspecified: Secondary | ICD-10-CM | POA: Diagnosis not present

## 2017-07-15 DIAGNOSIS — I639 Cerebral infarction, unspecified: Secondary | ICD-10-CM | POA: Diagnosis not present

## 2017-07-15 DIAGNOSIS — M6281 Muscle weakness (generalized): Secondary | ICD-10-CM | POA: Diagnosis not present

## 2017-07-15 LAB — COMPREHENSIVE METABOLIC PANEL
ALK PHOS: 81 U/L (ref 38–126)
ALT: 17 U/L (ref 14–54)
AST: 24 U/L (ref 15–41)
Albumin: 4 g/dL (ref 3.5–5.0)
Anion gap: 8 (ref 5–15)
BUN: 19 mg/dL (ref 6–20)
CALCIUM: 9.4 mg/dL (ref 8.9–10.3)
CO2: 27 mmol/L (ref 22–32)
CREATININE: 0.82 mg/dL (ref 0.44–1.00)
Chloride: 107 mmol/L (ref 101–111)
GFR calc Af Amer: >60 mL/min (ref >60)
Glucose, Bld: 99 mg/dL (ref 65–99)
Potassium: 4.3 mmol/L (ref 3.5–5.1)
Sodium: 142 mmol/L (ref 135–145)
Total Bilirubin: 0.5 mg/dL (ref 0.3–1.2)
Total Protein: 7.1 g/dL (ref 6.5–8.1)

## 2017-07-15 LAB — DIFFERENTIAL
BASOS ABS: 0 10*3/uL (ref 0–0.1)
BASOS PCT: 1 %
Eosinophils Absolute: 0.2 10*3/uL (ref 0–0.7)
Eosinophils Relative: 2 %
LYMPHS PCT: 22 %
Lymphs Abs: 1.8 10*3/uL (ref 1.0–3.6)
MONOS PCT: 6 %
Monocytes Absolute: 0.5 10*3/uL (ref 0.2–0.9)
NEUTROS ABS: 5.6 10*3/uL (ref 1.4–6.5)
Neutrophils Relative %: 69 %

## 2017-07-15 LAB — URINE DRUG SCREEN, QUALITATIVE (ARMC ONLY)
Amphetamines, Ur Screen: NOT DETECTED
Benzodiazepine, Ur Scrn: POSITIVE — AB
CANNABINOID 50 NG, UR ~~LOC~~: NOT DETECTED
COCAINE METABOLITE, UR ~~LOC~~: NOT DETECTED
MDMA (ECSTASY) UR SCREEN: NOT DETECTED
Methadone Scn, Ur: NOT DETECTED
Opiate, Ur Screen: NOT DETECTED
Phencyclidine (PCP) Ur S: NOT DETECTED
Tricyclic, Ur Screen: NOT DETECTED

## 2017-07-15 LAB — URINALYSIS, ROUTINE W REFLEX MICROSCOPIC
Bilirubin Urine: NEGATIVE
Glucose, UA: NEGATIVE mg/dL
Hgb urine dipstick: NEGATIVE
Ketones, ur: NEGATIVE mg/dL
Nitrite: NEGATIVE
PROTEIN: NEGATIVE mg/dL
Specific Gravity, Urine: 1.02 (ref 1.005–1.030)
pH: 7 (ref 5.0–8.0)

## 2017-07-15 LAB — CBC
HEMATOCRIT: 43.9 % (ref 35.0–47.0)
Hemoglobin: 14.6 g/dL (ref 12.0–16.0)
MCH: 31.7 pg (ref 26.0–34.0)
MCHC: 33.3 g/dL (ref 32.0–36.0)
MCV: 95.2 fL (ref 80.0–100.0)
Platelets: 216 10*3/uL (ref 150–440)
RBC: 4.61 MIL/uL (ref 3.80–5.20)
RDW: 15.1 % — AB (ref 11.5–14.5)
WBC: 8 10*3/uL (ref 3.6–11.0)

## 2017-07-15 LAB — TROPONIN I: Troponin I: <0.03 ng/mL (ref <0.03)

## 2017-07-15 LAB — PROTIME-INR
INR: 0.96
Prothrombin Time: 12.7 seconds (ref 11.4–15.2)

## 2017-07-15 LAB — APTT: APTT: 27 s (ref 24–36)

## 2017-07-15 LAB — ETHANOL

## 2017-07-15 MED ORDER — CEPHALEXIN 500 MG PO CAPS: 500.0000 mg | ORAL_CAPSULE | Freq: Three times a day (TID) | ORAL | 0 refills | Status: AC

## 2017-07-15 MED ORDER — CEPHALEXIN 500 MG PO CAPS
500.0000 mg | ORAL_CAPSULE | Freq: Once | ORAL | Status: AC
  Administered 2017-07-15: 500 mg via ORAL
  Filled 2017-07-15: qty 1

## 2017-07-15 NOTE — ED Provider Notes (Signed)
[facility] Emergency Department Provider Note ____________________________________________   First MD Initiated Contact with Patient 07/15/17 1434     (approximate)  I have reviewed the triage vital signs and the nursing notes.   HISTORY  Chief Complaint Weakness  HPI Beth [name] is a 72 y.o. female with a history of CVA on aspirin who was presented to the emergency department with progressive left-sided weakness as well as slurred speech over the past 3 to 4 weeks.  She has a history of left-sided weakness secondary to a remote stroke.  However, she feels that the symptoms have been dramatically worse over the past 3 to 4 weeks.  She was evaluated by her primary care doctor at [facility] who sent her into the emergency department for further work-up today.  Patient denies any pain.  Past Medical History:  Diagnosis Date  . Allergy   . Anxiety   . Asthma   . Chronic pain syndrome    discharged from pain clinic, hx of narcotics seeking behavior  . COPD (chronic obstructive pulmonary disease) ([place])   . CVA (cerebral infarction)   . Depression   . Fibromyalgia   . Headache   . Hyperlipidemia   . Hypertension   . IBS (irritable bowel syndrome)   . Stroke ([place])   . Vitamin D deficiency     Patient Active Problem List   Diagnosis Date Noted  . Urinary tract infection 12/05/2016  . Protein-calorie malnutrition, mild ([place]) 08/07/2016  . Generalized anxiety disorder 07/15/2016  . Chronic neck and back pain 07/15/2016  . Severe episode of recurrent major depressive disorder, without psychotic features ([place]) 07/14/2016  . Moderate benzodiazepine use disorder ([place]) 05/08/2016  . Major depressive disorder, recurrent, severe w/o psychotic behavior ([place]) 05/01/2016  . Seasonal allergic rhinitis 03/23/2015  . Hyperglycemia 03/23/2015  . Senile purpura ([place]) 03/23/2015  . Perennial allergic rhinitis with seasonal variation 03/23/2015  .  Marital problems 10/31/2014  . Migraine without aura and without status migrainosus, not intractable 10/31/2014  . Chronic LBP 08/09/2014  . Colon polyp 08/09/2014  . COPD, severe ([place]) 08/09/2014  . CVA, old, hemiparesis ([place]) 08/09/2014  . Dyslipidemia 08/09/2014  . Dysfunction of eustachian tube 08/09/2014  . Fibromyalgia syndrome 08/09/2014  . Gastro-esophageal reflux disease without esophagitis 08/09/2014  . Benign migrating glossitis 08/09/2014  . Cerebrovascular accident, old 08/09/2014  . IBS (irritable bowel syndrome) 08/09/2014  . Low back pain with radiation 08/09/2014  . Chronic recurrent major depressive disorder ([place]) 08/09/2014  . Dysmetabolic syndrome 61/60/7371  . OP (osteoporosis) 08/09/2014  . Vitamin D deficiency 08/09/2014  . Benign hypertension 07/19/2013  . Benign neoplasm of skin of trunk 06/03/2013  . H/O: pneumonia 09/25/2012    Past Surgical History:  Procedure Laterality Date  . ABDOMINAL HYSTERECTOMY    . APPENDECTOMY    . BREAST SURGERY  2011   biopsy  . CERVICAL DISCECTOMY    . CHOLECYSTECTOMY    . SINUSOTOMY      Prior to Admission medications   Medication Sig Start Date End Date Taking? Authorizing Provider  acetaminophen (TYLENOL) 325 MG tablet Take 650 mg by mouth every 4 (four) hours as needed.    [provider]  ARIPiprazole (ABILIFY) 10 MG tablet Take 10 mg by mouth at bedtime. 07/10/17   [provider]  aspirin 81 MG chewable tablet Chew 81 mg daily by mouth.    [provider]  atorvastatin (LIPITOR) 40 MG tablet TAKE 1 TABLET(40  MG) BY MOUTH DAILY 02/18/17   Steele Sizer, MD  chlorhexidine (PERIDEX) 0.12 % solution 30 mLs by Mouth Rinse route as needed. 05/20/17   [provider]  DULoxetine (CYMBALTA) 20 MG capsule Take 2 capsules (40 mg total) by mouth daily. 06/24/17   [name], MD  fluticasone furoate-vilanterol (BREO ELLIPTA) 100-25 MCG/INH AEPB Inhale 1 puff into the lungs daily. 02/18/17    Steele Sizer, MD  gabapentin (NEURONTIN) 300 MG capsule Take 2 capsules (600 mg total) by mouth 3 (three) times daily. 06/24/17 06/24/18  [name], MD  Ipratropium-Albuterol (COMBIVENT RESPIMAT) 20-100 MCG/ACT AERS respimat INHALE 1 PUFF BY MOUTH EVERY 6 HOURS. MUST LAST 3 MONTHS 02/18/17   Steele Sizer, MD  metoprolol succinate (TOPROL-XL) 25 MG 24 hr tablet Take 0.5 tablets by mouth daily. 05/20/17   [provider]  Multiple Vitamin (THERA) TABS Take by mouth. 07/19/16   [provider]  sertraline (ZOLOFT) 50 MG tablet Take 50 mg by mouth every morning. 07/10/17   [provider]  valACYclovir (VALTREX) 1000 MG tablet Take 1 tablet (1,000 mg total) by mouth 2 (two) times daily. 06/26/17   Steele Sizer, MD    Allergies Bextra  [valdecoxib]; Compazine  [prochlorperazine edisylate]; Lithium carbonate; and Lyrica [pregabalin]  Family History  Problem Relation Age of Onset  . Anxiety disorder Mother   . Depression Mother   . Cancer Father   . Gallbladder disease Father   . Alcohol abuse Father   . Depression Father     Social History Social History   Tobacco Use  . Smoking status: Current Some Day Smoker    Packs/day: 0.75    Years: 35.00    Pack years: 26.25    Types: Cigarettes  . Smokeless tobacco: Never Used  Substance Use Topics  . Alcohol use: No    Alcohol/week: 0.0 oz  . Drug use: No    Review of Systems  Constitutional: No fever/chills Eyes: No visual changes. ENT: No sore throat. Cardiovascular: Denies chest pain. Respiratory: Denies shortness of breath. Gastrointestinal: No abdominal pain.  No nausea, no vomiting.  No diarrhea.  No constipation. Genitourinary: Negative for dysuria. Musculoskeletal: Negative for back pain. Skin: Negative for rash. Neurological: Negative for headaches   ____________________________________________   PHYSICAL EXAM:  VITAL SIGNS: ED Triage Vitals  Enc Vitals Group     BP 07/15/17  1431 (!) 155/73     Pulse Rate 07/15/17 1431 94     Resp 07/15/17 1431 16     Temp 07/15/17 1431 98.5 F (36.9 C)     Temp Source 07/15/17 1431 Oral     SpO2 07/15/17 1431 99 %     Weight 07/15/17 1432 127 lb (57.6 kg)     Height 07/15/17 1432 5\' 5"  (1.651 m)     Head Circumference --      Peak Flow --      Pain Score 07/15/17 1432 0     Pain Loc --      Pain Edu? --      Excl. in [place]? --     Constitutional: Alert and oriented. Well appearing and in no acute distress. Eyes: Conjunctivae are normal.  Head: Atraumatic. Nose: No congestion/rhinnorhea. Mouth/Throat: Mucous membranes are moist.  Neck: No stridor.   Cardiovascular: Normal rate, regular rhythm. Grossly normal heart sounds.   Respiratory: Normal respiratory effort.  No retractions. Lungs CTAB. Gastrointestinal: Soft and nontender. No distention. No CVA tenderness. Musculoskeletal: No lower extremity tenderness nor edema.  No joint effusions. Neurologic: Slurred speech but without any facial drooping.  Strong 4-5 strength in the left upper extremity with a 3 out of 5 strength in the left lower extremity. Skin:  Skin is warm, dry and intact. No rash noted. Psychiatric: Mood and affect are normal. Speech and behavior are normal.  NIH Stroke Scale    Time: 3:25 PM Person Administering Scale: Doran Stabler  Administer stroke scale items in the order listed. Record performance in each category after each subscale exam. Do not go back and change scores. Follow directions provided for each exam technique. Scores should reflect what the patient does, not what the clinician thinks the patient can do. The clinician should record answers while administering the exam and work quickly. Except where indicated, the patient should not be coached (i.e., repeated requests to patient to make a special effort).   1a  Level of consciousness: 0=alert; keenly responsive  1b. LOC questions:  0=Performs both tasks correctly  1c. LOC  commands: 0=Performs both tasks correctly  2.  Best Gaze: 0=normal  3.  Visual: 0=No visual loss  4. Facial Palsy: 0=Normal symmetric movement  5a.  Motor left arm: 0=No drift, limb holds 90 (or 45) degrees for full 10 seconds  5b.  Motor right arm: 0=No drift, limb holds 90 (or 45) degrees for full 10 seconds  6a. motor left leg: 1=Drift, limb holds 90 (or 45) degrees but drifts down before full 10 seconds: does not hit bed  6b  Motor right leg:  0=No drift, limb holds 90 (or 45) degrees for full 10 seconds  7. Limb Ataxia: 0=Absent  8.  Sensory: 0=Normal; no sensory loss  9. Best Language:  0=No aphasia, normal  10. Dysarthria: 1=Mild to moderate, patient slurs at least some words and at worst, can be understood with some difficulty  11. Extinction and Inattention: 0=No abnormality  12. Distal motor function: 0=Normal   Total:   2   ____________________________________________   LABS (all labs ordered are listed, but only abnormal results are displayed)  Labs Reviewed  ETHANOL  PROTIME-INR  APTT  CBC  DIFFERENTIAL  COMPREHENSIVE METABOLIC PANEL  TROPONIN I  URINE DRUG SCREEN, QUALITATIVE ([facility] ONLY)  URINALYSIS, ROUTINE W REFLEX MICROSCOPIC   ____________________________________________  EKG  ED ECG REPORT I, Doran Stabler, the attending physician, personally viewed and interpreted this ECG.   Date: 07/15/2017  EKG Time: 1437  Rate: 89  Rhythm: normal sinus rhythm  Axis: Normal  Intervals:none  ST&T Change: No ST segment elevation or depression.  No abnormal T wave inversion.  ____________________________________________  RADIOLOGY  Acute finding on the CT of the brain.  Small old right central lacunar infarcts. ____________________________________________   PROCEDURES  Procedure(s) performed:   Procedures  Critical Care performed:   ____________________________________________   INITIAL IMPRESSION / ASSESSMENT AND PLAN / ED COURSE  Pertinent  labs & imaging results that were available during my care of the patient were reviewed by me and considered in my medical decision making (see chart for details).  DDX: CVA, UTI, less short abnormality, and cranial hemorrhage, atrophy As part of my medical decision making, I reviewed the following data within the [facility] care note reviewed from earlier today.  ----------------------------------------- 3:26 PM on 07/15/2017 -----------------------------------------  Patient pending blood work and urine at this time.  To be reevaluated by Dr. Alfred Levins.  Possible MRI depending on lab results. ____________________________________________   FINAL CLINICAL IMPRESSION(S) / ED DIAGNOSES  Dysarthria.  Left-sided weakness.    NEW MEDICATIONS STARTED DURING THIS VISIT:  New Prescriptions   No medications on file     Note:  This document was prepared using Dragon voice recognition software and may include unintentional dictation errors.     Orbie Pyo, MD 07/15/17 [id number]

## 2017-07-15 NOTE — ED Notes (Signed)
Pt up to bathroom with steady gait noted  

## 2017-07-15 NOTE — Progress Notes (Addendum)
Name: Beth [name]   MRN: [id number]    DOB: 31-May-1945   Date:07/15/2017       Progress Note  Subjective  Chief Complaint  Chief Complaint  Patient presents with  . URI  . Extremity Weakness    HPI  Pt notes left arm and leg weakness started 3-4 weeks ago, slurred speech started then too and has been having trouble with saliva and been choking on saliva since. Sts has been having intermittent double vision since then. Does not have double vision right now. Has hx of stroke with residual left sided weakness but notes weakness is significantly worse. Sts did not call 911 because she already has a lot of medical bills. States this morning she noticed that weakness and difficulty swallowing was worse then it was yesterday. Feels she now has to drag her left leg more.  Pt endorses rhinorrhea and nasal sores; 3 weeks.   Patient Active Problem List   Diagnosis Date Noted  . Urinary tract infection 12/05/2016  . Protein-calorie malnutrition, mild ([place]) 08/07/2016  . Generalized anxiety disorder 07/15/2016  . Chronic neck and back pain 07/15/2016  . Severe episode of recurrent major depressive disorder, without psychotic features ([place]) 07/14/2016  . Moderate benzodiazepine use disorder ([place]) 05/08/2016  . Major depressive disorder, recurrent, severe w/o psychotic behavior ([place]) 05/01/2016  . Seasonal allergic rhinitis 03/23/2015  . Hyperglycemia 03/23/2015  . Senile purpura ([place]) 03/23/2015  . Perennial allergic rhinitis with seasonal variation 03/23/2015  . Marital problems 10/31/2014  . Migraine without aura and without status migrainosus, not intractable 10/31/2014  . Chronic LBP 08/09/2014  . Colon polyp 08/09/2014  . COPD, severe ([place]) 08/09/2014  . CVA, old, hemiparesis ([place]) 08/09/2014  . Dyslipidemia 08/09/2014  . Dysfunction of eustachian tube 08/09/2014  . Fibromyalgia syndrome 08/09/2014  . Gastro-esophageal reflux disease without esophagitis 08/09/2014  . Benign  migrating glossitis 08/09/2014  . Cerebrovascular accident, old 08/09/2014  . IBS (irritable bowel syndrome) 08/09/2014  . Low back pain with radiation 08/09/2014  . Chronic recurrent major depressive disorder ([place]) 08/09/2014  . Dysmetabolic syndrome 97/67/3419  . OP (osteoporosis) 08/09/2014  . Vitamin D deficiency 08/09/2014  . Benign hypertension 07/19/2013  . Benign neoplasm of skin of trunk 06/03/2013  . H/O: pneumonia 09/25/2012    Past Medical History:  Diagnosis Date  . Allergy   . Anxiety   . Asthma   . Chronic pain syndrome    discharged from pain clinic, hx of narcotics seeking behavior  . COPD (chronic obstructive pulmonary disease) ([place])   . CVA (cerebral infarction)   . Depression   . Fibromyalgia   . Headache   . Hyperlipidemia   . Hypertension   . IBS (irritable bowel syndrome)   . Stroke ([place])   . Vitamin D deficiency     Past Surgical History:  Procedure Laterality Date  . ABDOMINAL HYSTERECTOMY    . APPENDECTOMY    . BREAST SURGERY  2011   biopsy  . CERVICAL DISCECTOMY    . CHOLECYSTECTOMY    . SINUSOTOMY      Social History   Tobacco Use  . Smoking status: Current Some Day Smoker    Packs/day: 0.75    Years: 35.00    Pack years: 26.25    Types: Cigarettes  . Smokeless tobacco: Never Used  Substance Use Topics  . Alcohol use: No    Alcohol/week: 0.0 oz     Current Outpatient Medications:  .  acetaminophen (TYLENOL) 325 MG  tablet, Take 650 mg by mouth every 4 (four) hours as needed., Disp: , Rfl:  .  ARIPiprazole (ABILIFY) 10 MG tablet, Take 10 mg by mouth at bedtime., Disp: , Rfl: 0 .  aspirin 81 MG chewable tablet, Chew 81 mg daily by mouth., Disp: , Rfl:  .  atorvastatin (LIPITOR) 40 MG tablet, TAKE 1 TABLET(40 MG) BY MOUTH DAILY, Disp: 90 tablet, Rfl: 1 .  chlorhexidine (PERIDEX) 0.12 % solution, 30 mLs by Mouth Rinse route as needed., Disp: , Rfl: 1 .  DULoxetine (CYMBALTA) 20 MG capsule, Take 2 capsules (40 mg total) by mouth  daily., Disp: 60 capsule, Rfl: 3 .  fluticasone furoate-vilanterol (BREO ELLIPTA) 100-25 MCG/INH AEPB, Inhale 1 puff into the lungs daily., Disp: 60 each, Rfl: 5 .  gabapentin (NEURONTIN) 300 MG capsule, Take 2 capsules (600 mg total) by mouth 3 (three) times daily., Disp: 180 capsule, Rfl: 3 .  Ipratropium-Albuterol (COMBIVENT RESPIMAT) 20-100 MCG/ACT AERS respimat, INHALE 1 PUFF BY MOUTH EVERY 6 HOURS. MUST LAST 3 MONTHS, Disp: 1 Inhaler, Rfl: 0 .  metoprolol succinate (TOPROL-XL) 25 MG 24 hr tablet, Take 0.5 tablets by mouth daily., Disp: , Rfl: 1 .  Multiple Vitamin (THERA) TABS, Take by mouth., Disp: , Rfl:  .  sertraline (ZOLOFT) 50 MG tablet, Take 50 mg by mouth every morning., Disp: , Rfl: 0 .  valACYclovir (VALTREX) 1000 MG tablet, Take 1 tablet (1,000 mg total) by mouth 2 (two) times daily., Disp: 10 tablet, Rfl: 0  Allergies  Allergen Reactions  . Bextra  [Valdecoxib]   . Compazine  [Prochlorperazine Edisylate]   . Lithium Carbonate   . Lyrica [Pregabalin]     ROS  Constitutional: Negative for fever or weight change.  Respiratory: Negative for cough and shortness of breath.   Cardiovascular: Negative for chest pain or palpitations.  Gastrointestinal: Negative for abdominal pain, no bowel changes.  Musculoskeletal: Positive for gait problem negative joint swelling.  Skin: Negative for rash.  Neurological: Negative for dizziness or headache.  No other specific complaints in a complete review of systems (except as listed in HPI above).  Objective  Vitals:   07/15/17 1327  BP: 124/70  Pulse: 100  Resp: 16  Temp: 98.8 F (37.1 C)  TempSrc: Oral  SpO2: 93%  Weight: 127 lb 8 oz (57.8 kg)  Height: 5\' 5"  (1.651 m)    Body mass index is 21.22 kg/m.  Nursing Note and Vital Signs reviewed.  Physical Exam  Constitutional: Patient appears well-developed and well-nourished.  No distress.  HEENT: head atraumatic, normocephalic, pupils equal and reactive to light,  no  maxillary or frontal sinus tenderness,oropharynx pink and moist without exudate, Pt has dried nasal discharge and left nostril sore Cardiovascular: Mildly elevated rate, regular rhythm, S1/S2 present. Pulses intact Pulmonary/Chest: Effort normal and breath sounds clear. No respiratory distress or retractions. Neuro: patient is alert and oriented, no nystagmus noted, no limb ataxia, weak left hand grip with mild drift extended in both left upper and lower extremity, no drift in right extremity. Sensation intact, mild left sided facial droop.  Psychiatric: Patient has a normal mood and affect. behavior is normal. Judgment and thought content normal.  No results found for this or any previous visit (from the past 72 hour(s)).  Assessment & Plan  1. Slurred speech -refer to ER via ambulance   2. Left-sided weakness -refer to ER via ambulance   3. Dysphagia, unspecified type -refer to ER via ambulance   4. Diplopia -refer to  ER via ambulance   5. Nasal sore Neosporin  Patient has hx of stroke with left sided deficits notes increasing deficits over the past 3-4 weeks unable to pinpoint specific date of acute worsening, she is somewhat of a poor historian and is getting her dates mixed up. States at that time noticed dysphagia and diplopia which she didn't have with prior stroke. States this morning when she woke up her symptoms were worse then they were the following days. Discussed with patients PCP- Dr. Ancil Boozer. Due to history provided by pt will send to ER.   Face-to-face time with patient was more than 25 minutes, >50% time spent counseling and coordination of care  Follow up and care instructions discussed and provided in AVS  I have reviewed this encounter including the documentation in this note and/or discussed this patient with the provider, Suezanne Cheshire DNP AGNP-C. I am certifying that I agree with the content of this note as supervising physician. Steele Sizer,  MD [facility] 07/15/2017, 8:24 PM

## 2017-07-15 NOTE — ED Notes (Addendum)
Pt asking if she can leave. Explained to pt that we are waiting on results of her MRI to make a decision. BP cuff placed again. Pt was found at the end of the bed. Instructed pt and husband not to let pt get out of bed without a staff member so she won't fall.

## 2017-07-15 NOTE — ED Notes (Signed)
Pt reports pain to the right ear for 2 months. Pt reports that she has ahd a stroke in the past and has some weakness to the left leg and arm but the swallowing difficulty and the saliva excess in her mouth is new.

## 2017-07-15 NOTE — ED Triage Notes (Signed)
Pt was sent to ED from [facility]. Pt was seeing her PCP today for left sided weakness that has progressively worsened over past 3 weeks. Pt has had CVA in past with left sided deficits. Pt reports she has been having to drag her leg more with ambulation. Pt denies pain. A/O x 4. Follows commands appropriately.

## 2017-07-15 NOTE — Patient Instructions (Addendum)
- Sending you to ER due to history of stroke with progressive left sided arm and leg weakness, new onset of slurred speech, difficulty swallowing, choking on saliva, and intermittent double vision over the past 3 weeks- progressively worsened today  Warning Signs of a Stroke A stroke is a medical emergency and should be treated right away-every second counts. A stroke is caused by a decrease or block in blood flow to the brain. When this occurs, certain areas of the brain do not get enough oxygen, and brain cells begin to die. A stroke can lead to brain damage and can sometimes be life-threatening. However, if someone having a stroke gets medical treatment right away, he or she has better chances of surviving and recovering from the stroke. Being able to recognize the symptoms of a stroke is very important. Types of strokes There are two main types of strokes:  Ischemic strokes. This is the most common type of stroke. These strokes happen when a blood vessel that supplies blood to the brain is being blocked.  Hemorrhagic strokes. These strokes result from bleeding in the brain due to a blood vessel leaking or bursting (rupturing).  A transient ischemic attack (TIA) is a "warning stroke" that causes stroke-like symptoms that go away quickly. Unlike a stroke, a TIA does not cause permanent damage to the brain. However, the symptoms of a TIA are the same as a stroke, and they also require medical treatment right away. Having a TIA is a sign that you are at higher risk for a permanent stroke. Warning signs of a stroke The symptoms of stroke may vary and will reflect the part of the brain that is involved. Symptoms usually happen suddenly. "BE FAST" is an easy way to remember the main warning signs of a stroke. B - Balance Signs are dizziness, sudden trouble walking, or loss of balance. E - Eyes Signs are trouble seeing or a sudden change in vision. F - Face Signs are sudden weakness or numbness of  the face, or the face or eyelid drooping on one side. A - Arms Signs are weakness or numbness in an arm. This happens suddenly and usually on one side of the body. S - Speech Signs are sudden trouble speaking, slurred speech, or trouble understanding what people say. T - Time Time to call emergency services. Write down what time symptoms started. Other signs of a stroke Some less common signs of a stroke include:  A sudden, severe headache with no known cause.  Nausea or vomiting.  Seizure.  A stroke may be happening even if only one "BE FAST" symptoms is present. These symptoms may represent a serious problem that is an emergency. Do not wait to see if the symptoms will go away. Get medical help right away. Call your local emergency services (911 in the [place] Do not drive yourself to the hospital. Summary  A stroke is a medical emergency and should be treated right away-every second counts.  "BE FAST" is an easy way to remember the main warning signs of a stroke.  Call local emergency services right away if you or someone else has any stroke symptoms, even if the symptoms go away.  Make note of what time the first symptoms appeared. Emergency responders or emergency room staff will need to know this information.  Do not wait to see if symptoms will go away. Call 911 even if only one of the "BE FAST" symptoms appears. This information is not intended to replace advice  given to you by your health care provider. Make sure you discuss any questions you have with your health care provider. Document Released: 05/03/2016 Document Revised: 05/03/2016 Document Reviewed: 05/03/2016 Elsevier Interactive Patient Education  [employer].

## 2017-07-15 NOTE — ED Notes (Signed)
ED Provider at bedside. 

## 2017-07-15 NOTE — ED Notes (Signed)
Patient transported to X-ray 

## 2017-07-15 NOTE — ED Notes (Signed)
Patient transported to MRI 

## 2017-07-15 NOTE — ED Provider Notes (Signed)
-----------------------------------------   9:50 PM on 07/15/2017 -----------------------------------------   Blood pressure (!) 156/76, pulse 91, temperature 98.5 F (36.9 C), temperature source Oral, resp. rate 15, height 5\' 5"  (1.651 m), weight 57.6 kg (127 lb), SpO2 95 %.  Assuming care from Dr. Clearnce Hasten of Beth [name] is a 72 y.o. female with a chief complaint of Weakness .    Please refer to H&P by previous MD for further details.  The current plan of care is to f/u results of labs and MRI.  I have personally reviewed the images performed during this visit and I agree with the Radiologist's read.   Interpretation by Radiologist:  Ct Head Wo Contrast  Result Date: 07/15/2017 CLINICAL DATA:  Left-sided weakness progressively worse over the past 3 weeks. Previous left-sided stroke with residual deficits. Worsening leg weakness. EXAM: CT HEAD WITHOUT CONTRAST TECHNIQUE: Contiguous axial images were obtained from the base of the skull through the vertex without intravenous contrast. COMPARISON:  02/09/2016 FINDINGS: Brain: Ventricles, cisterns and other CSF spaces are within normal. There is evidence of an old lacune infarct over the right lentiform nucleus and right periventricular white matter. No mass, mass effect, shift of midline structures or acute hemorrhage. No evidence of acute infarction. Vascular: No hyperdense vessel or unexpected calcification. Skull: Normal. Negative for fracture or focal lesion. Sinuses/Orbits: No acute finding. Other: None. IMPRESSION: No acute findings. Small old right central lacunar infarcts. Electronically Signed   By: Marin Olp M.D.   On: 07/15/2017 15:05   Mr Brain Wo Contrast  Result Date: 07/15/2017 CLINICAL DATA:  Initial evaluation for left arm and leg weakness with slurred speech for 3-4 weeks. EXAM: MRI HEAD WITHOUT CONTRAST TECHNIQUE: Multiplanar, multiecho pulse sequences of the brain and surrounding structures were obtained without  intravenous contrast. COMPARISON:  Prior CT from earlier same day. FINDINGS: Brain: Generalized age-related cerebral atrophy. Mild chronic small vessel ischemic change present within the periventricular white matter. Small remote lacunar infarcts present at the posterior right lentiform nucleus/corona radiata. Associated small amount of chronic hemosiderin staining. No evidence for acute or subacute infarct. Gray-white matter differentiation otherwise maintained. No other areas of chronic infarction. No evidence for acute intracranial hemorrhage. No mass lesion, midline shift or mass effect. No hydrocephalus. No extra-axial fluid collection. Normal pituitary gland. Vascular: Major intracranial vascular flow voids maintained. Skull and upper cervical spine: Craniocervical junction normal. Upper cervical spine within normal limits. Bone marrow signal intensity normal. No scalp soft tissue abnormality. Sinuses/Orbits: Globes and orbital soft tissues within normal limits. Paranasal sinuses are largely clear. Patient status post sinus surgery on the right. Trace left mastoid effusion, of doubtful significance. Inner ear structures normal. Other: None. IMPRESSION: 1. No acute intracranial abnormality. 2. Remote lacunar infarcts involving the right basal ganglia/corona radiata. 3. Mild chronic small vessel ischemic disease. Electronically Signed   By: Jeannine Boga M.D.   On: 07/15/2017 19:29   Labs concerning for UTI with no signs of sepsis. Patient reports dysuria and frequency for the last 3 weeks coinciding with worsening of her neurological symptoms. MRI negative for acute/subacute stroke. Patient started on keflex for UTI and will be dc home with close f/u with PCP.  Return precautions for signs of worsening UTI, pyelonephritis, or acute stroke were discussed with patient.        Rudene Re, MD 07/15/17 2153

## 2017-07-15 NOTE — ED Notes (Signed)
Pt up to bedside commode. Pt states she does not feel her baseline with walking but wants to go home. Pt is able to ambulate alone.

## 2017-07-15 NOTE — Discharge Instructions (Addendum)

## 2017-07-17 LAB — URINE CULTURE: CULTURE: NO GROWTH

## 2017-07-18 ENCOUNTER — Telehealth: Payer: Self-pay

## 2017-07-18 NOTE — Telephone Encounter (Signed)
It is not a hospital follow up, just EC visit , can see Beth [name]

## 2017-07-18 NOTE — Telephone Encounter (Signed)
Copied from [place] [id number]. Topic: Appointment Scheduling - Scheduling Inquiry for Clinic >> Jul 17, 2017 12:38 PM Oliver Pila B wrote: Reason for CRM: pt needs a hos f/u, but the pt needs to come around 3pm and pcp has not slots around that time in the near future, call pt to advise

## 2017-07-21 NOTE — Telephone Encounter (Signed)
LVM for pt to call and schedule an appt for ER fu. 20 minute appt with Dr Ancil Boozer or Marilynn Rail NP

## 2017-07-28 ENCOUNTER — Encounter: Payer: Self-pay | Admitting: Nurse Practitioner

## 2017-07-28 ENCOUNTER — Ambulatory Visit (INDEPENDENT_AMBULATORY_CARE_PROVIDER_SITE_OTHER): Payer: Medicare HMO | Admitting: Nurse Practitioner

## 2017-07-28 VITALS — BP 130/80 | HR 90 | Temp 98.0°F | Resp 16 | Ht 65.0 in | Wt 124.8 lb

## 2017-07-28 DIAGNOSIS — R4781 Slurred speech: Secondary | ICD-10-CM | POA: Diagnosis not present

## 2017-07-28 DIAGNOSIS — I69359 Hemiplegia and hemiparesis following cerebral infarction affecting unspecified side: Secondary | ICD-10-CM | POA: Diagnosis not present

## 2017-07-28 DIAGNOSIS — R2990 Unspecified symptoms and signs involving the nervous system: Secondary | ICD-10-CM

## 2017-07-28 NOTE — Patient Instructions (Addendum)
Please do call to schedule your mammogram; the number to schedule one at either [facility] or [facility] is [phone]  If you have not heard anything from my staff in a week about any orders/referrals/studies from today, please contact us here to follow-up [phone]

## 2017-07-28 NOTE — Progress Notes (Addendum)
Name: Beth [name]   MRN: [id number]    DOB: 01-Oct-1945   Date:07/28/2017       Progress Note  Subjective  Chief Complaint  Chief Complaint  Patient presents with  . Aphasia  . Extremity Weakness    Left side  . Stroke Symptoms    Has history of strokes    HPI  Patient presented to clinic on 6/18 with nasal sores, and worsening left sided weakness, slurred speech and diplopia. Patient was referred to ER. MRI completed showing no acute intracranial changes; remote lacunar infarcts and chronic small vessel ischemic disease. ER noted pt had possible UTI and started her on keflex- negative culture noted. Patient completed antibiotic course. Patient denies urinary frequency or dysuria. Speech has not improved states has trouble eating- has to do mashed or creamy foods.   Since ER patient notes that she is having more trouble with walking, and using hand. States no longer able to make the bed and do dishes and hold glass as well as she did before. Denies any falls.   Lots of pain in lower back and hips, sees Dr. Holley Raring- last seen on 5/28 reccommended lumbar facet- patient has to call to get it scheduled.    Patient Active Problem List   Diagnosis Date Noted  . Urinary tract infection 12/05/2016  . Protein-calorie malnutrition, mild ([place]) 08/07/2016  . Generalized anxiety disorder 07/15/2016  . Chronic neck and back pain 07/15/2016  . Severe episode of recurrent major depressive disorder, without psychotic features ([place]) 07/14/2016  . Moderate benzodiazepine use disorder ([place]) 05/08/2016  . Major depressive disorder, recurrent, severe w/o psychotic behavior ([place]) 05/01/2016  . Seasonal allergic rhinitis 03/23/2015  . Hyperglycemia 03/23/2015  . Senile purpura ([place]) 03/23/2015  . Perennial allergic rhinitis with seasonal variation 03/23/2015  . Marital problems 10/31/2014  . Migraine without aura and without status migrainosus, not intractable 10/31/2014  . Chronic LBP 08/09/2014  .  Colon polyp 08/09/2014  . COPD, severe ([place]) 08/09/2014  . CVA, old, hemiparesis ([place]) 08/09/2014  . Dyslipidemia 08/09/2014  . Dysfunction of eustachian tube 08/09/2014  . Fibromyalgia syndrome 08/09/2014  . Gastro-esophageal reflux disease without esophagitis 08/09/2014  . Benign migrating glossitis 08/09/2014  . Cerebrovascular accident, old 08/09/2014  . IBS (irritable bowel syndrome) 08/09/2014  . Low back pain with radiation 08/09/2014  . Chronic recurrent major depressive disorder ([place]) 08/09/2014  . Dysmetabolic syndrome 00/86/7619  . OP (osteoporosis) 08/09/2014  . Vitamin D deficiency 08/09/2014  . Benign hypertension 07/19/2013  . Benign neoplasm of skin of trunk 06/03/2013  . H/O: pneumonia 09/25/2012    Past Medical History:  Diagnosis Date  . Allergy   . Anxiety   . Asthma   . Chronic pain syndrome    discharged from pain clinic, hx of narcotics seeking behavior  . COPD (chronic obstructive pulmonary disease) ([place])   . CVA (cerebral infarction)   . Depression   . Fibromyalgia   . Headache   . Hyperlipidemia   . Hypertension   . IBS (irritable bowel syndrome)   . Stroke ([place])   . Vitamin D deficiency     Past Surgical History:  Procedure Laterality Date  . ABDOMINAL HYSTERECTOMY    . APPENDECTOMY    . BREAST SURGERY  2011   biopsy  . CERVICAL DISCECTOMY    . CHOLECYSTECTOMY    . SINUSOTOMY      Social History   Tobacco Use  . Smoking status: Current Some Day Smoker  Packs/day: 0.75    Years: 35.00    Pack years: 26.25    Types: Cigarettes  . Smokeless tobacco: Never Used  Substance Use Topics  . Alcohol use: No    Alcohol/week: 0.0 oz     Current Outpatient Medications:  .  acetaminophen (TYLENOL) 325 MG tablet, Take 650 mg by mouth every 4 (four) hours as needed., Disp: , Rfl:  .  ARIPiprazole (ABILIFY) 10 MG tablet, Take 10 mg by mouth at bedtime., Disp: , Rfl: 0 .  aspirin 81 MG chewable tablet, Chew 81 mg daily by mouth., Disp: ,  Rfl:  .  atorvastatin (LIPITOR) 40 MG tablet, TAKE 1 TABLET(40 MG) BY MOUTH DAILY, Disp: 90 tablet, Rfl: 1 .  chlorhexidine (PERIDEX) 0.12 % solution, 30 mLs by Mouth Rinse route as needed., Disp: , Rfl: 1 .  DULoxetine (CYMBALTA) 20 MG capsule, Take 2 capsules (40 mg total) by mouth daily., Disp: 60 capsule, Rfl: 3 .  fluticasone furoate-vilanterol (BREO ELLIPTA) 100-25 MCG/INH AEPB, Inhale 1 puff into the lungs daily., Disp: 60 each, Rfl: 5 .  gabapentin (NEURONTIN) 300 MG capsule, Take 2 capsules (600 mg total) by mouth 3 (three) times daily., Disp: 180 capsule, Rfl: 3 .  Ipratropium-Albuterol (COMBIVENT RESPIMAT) 20-100 MCG/ACT AERS respimat, INHALE 1 PUFF BY MOUTH EVERY 6 HOURS. MUST LAST 3 MONTHS, Disp: 1 Inhaler, Rfl: 0 .  metoprolol succinate (TOPROL-XL) 25 MG 24 hr tablet, Take 12.5 mg by mouth daily. , Disp: , Rfl: 1 .  Multiple Vitamin (THERA) TABS, Take 1 tablet by mouth daily. , Disp: , Rfl:  .  sertraline (ZOLOFT) 50 MG tablet, Take 50 mg by mouth every morning., Disp: , Rfl: 0 .  valACYclovir (VALTREX) 1000 MG tablet, Take 1 tablet (1,000 mg total) by mouth 2 (two) times daily., Disp: 10 tablet, Rfl: 0  Allergies  Allergen Reactions  . Bextra  [Valdecoxib]   . Compazine  [Prochlorperazine Edisylate]   . Lithium Carbonate   . Lyrica [Pregabalin]     ROS  Constitutional: Negative for fever is Positive for weight loss because she cannot eat.  Respiratory: Positive for cough and Negative shortness of breath.   Cardiovascular: Negative for chest pain or palpitations.  Gastrointestinal: Negative for abdominal pain, no bowel changes.  Musculoskeletal: Positive for gait problem Negative joint swelling.  Skin: Negative for rash.  Neurological: Positive for dizziness-constant for over the past 2-3 months Negative headache.  No other specific complaints in a complete review of systems (except as listed in HPI above).  Objective  Vitals:   07/28/17 1536  BP: 130/80  Pulse: 90   Resp: 16  Temp: 98 F (36.7 C)  TempSrc: Oral  SpO2: 96%  Weight: 124 lb 12.8 oz (56.6 kg)  Height: 5\' 5"  (1.651 m)     Body mass index is 20.77 kg/m.  Nursing Note and Vital Signs reviewed.  Physical Exam   Constitutional: Patient appears well-developed and well-nourished. No distress.  HEENT: head atraumatic, normocephalic, pupils equal and reactive to light, oropharynx pink and moist without exudate, no nasal discharge Cardiovascular: Normal rate, regular rhythm, S1/S2 present.  No murmur or rub heard.  Pulmonary/Chest: Effort normal and breath sounds clear. No respiratory distress or retractions. Neurological: alert and oriented x 4, dysarthric, left sided facial droop, left arm decreased sensation to right, weaker left hand grip. Steady gait,  Psychiatric: Patient has a normal mood and affect. behavior is normal. Judgment and thought content normal.  No results found for this or  any previous visit (from the past 72 hour(s)).  Assessment & Plan  1. CVA, old, hemiparesis ([place])  - Ambulatory referral to Neurology - Ambulatory referral to [place] - Ambulatory referral to Physical Therapy - Ambulatory referral to Occupational Therapy  2. Stroke-like symptoms  - Ambulatory referral to Neurology - Ambulatory referral to [facility] - Ambulatory referral to Physical Therapy - Ambulatory referral to Occupational Therapy - Ambulatory referral to Speech Therapy  3. Slurred speech  - Ambulatory referral to Speech Therapy   Face-to-face time with patient was more than 25 minutes, >50% time spent counseling and coordination of care -Red flags and when to present for emergency care or RTC including fever >101.57F, chest pain, shortness of breath, new/worsening/un-resolving symptoms, reviewed with patient at time of visit. Follow up and care instructions discussed and provided in AVS.  ----------------------------------- I have reviewed this encounter including  the documentation in this note and/or discussed this patient with the provider, Suezanne Cheshire DNP AGNP-C. I am certifying that I agree with the content of this note as supervising physician. Enid Derry, [facility] 07/30/2017, 2:12 PM

## 2017-07-30 ENCOUNTER — Other Ambulatory Visit: Payer: Self-pay | Admitting: Nurse Practitioner

## 2017-08-05 DIAGNOSIS — G894 Chronic pain syndrome: Secondary | ICD-10-CM | POA: Diagnosis not present

## 2017-08-05 DIAGNOSIS — M545 Low back pain: Secondary | ICD-10-CM | POA: Diagnosis not present

## 2017-08-05 DIAGNOSIS — M542 Cervicalgia: Secondary | ICD-10-CM | POA: Diagnosis not present

## 2017-08-05 DIAGNOSIS — G8929 Other chronic pain: Secondary | ICD-10-CM | POA: Diagnosis not present

## 2017-08-05 DIAGNOSIS — M797 Fibromyalgia: Secondary | ICD-10-CM | POA: Diagnosis not present

## 2017-08-06 ENCOUNTER — Telehealth: Payer: Self-pay | Admitting: Family Medicine

## 2017-08-06 ENCOUNTER — Other Ambulatory Visit: Payer: Self-pay

## 2017-08-06 ENCOUNTER — Other Ambulatory Visit: Payer: Self-pay | Admitting: Family Medicine

## 2017-08-06 DIAGNOSIS — G894 Chronic pain syndrome: Secondary | ICD-10-CM | POA: Diagnosis not present

## 2017-08-06 DIAGNOSIS — M542 Cervicalgia: Secondary | ICD-10-CM | POA: Diagnosis not present

## 2017-08-06 DIAGNOSIS — J42 Unspecified chronic bronchitis: Secondary | ICD-10-CM

## 2017-08-06 DIAGNOSIS — M797 Fibromyalgia: Secondary | ICD-10-CM | POA: Diagnosis not present

## 2017-08-06 DIAGNOSIS — M545 Low back pain: Secondary | ICD-10-CM | POA: Diagnosis not present

## 2017-08-06 DIAGNOSIS — G8929 Other chronic pain: Secondary | ICD-10-CM | POA: Diagnosis not present

## 2017-08-06 MED ORDER — IPRATROPIUM-ALBUTEROL 20-100 MCG/ACT IN AERS: INHALATION_SPRAY | RESPIRATORY_TRACT | 0 refills | Status: DC

## 2017-08-06 NOTE — Telephone Encounter (Signed)
Copied from [place] [id number]. Topic: Quick Communication - Rx Refill/Question >> Aug 06, 2017 10:17 AM Alfredia Ferguson R wrote: Medication: DULoxetine (CYMBALTA) 20 MG capsule  Has the patient contacted their pharmacy? Yes (Agent: If no, request that the patient contact the pharmacy for the refill.) (Agent: If yes, when and what did the pharmacy advise?)  Preferred Pharmacy (with phone number or street name): [facility] (N), [place] [phone] (Phone) [phone] (Fax)      Agent: Please be advised that RX refills may take up to 3 business days. We ask that you follow-up with your pharmacy.

## 2017-08-06 NOTE — Telephone Encounter (Signed)
Requesting refill of (CYMBALTA) 20 MG capsule  LRF 06/24/17  #60  3 refills  LOV 07/28/17  Dr[facility] [address] (N), [place] - [place]   [phone] (Phone) [phone] (Fax)

## 2017-08-06 NOTE — Telephone Encounter (Signed)
Please advise 

## 2017-08-06 NOTE — Telephone Encounter (Signed)
Copied from [place] [id number]. Topic: Quick Communication - Rx Refill/Question >> Aug 06, 2017 10:15 AM Alfredia Ferguson R wrote: Medication: Ipratropium-Albuterol (COMBIVENT RESPIMAT) 20-100 MCG/ACT AERS respimat  Has the patient contacted their pharmacy? Yes (Agent: If no, request that the patient contact the pharmacy for the refill.) (Agent: If yes, when and what did the pharmacy advise?)  Preferred Pharmacy (with phone number or street name): [facility] (N), [place] - [place] [phone] (Phone) [phone] (Fax)      Agent: Please be advised that RX refills may take up to 3 business days. We ask that you follow-up with your pharmacy.

## 2017-08-06 NOTE — Telephone Encounter (Signed)
Okay to give verbal consent for PT

## 2017-08-06 NOTE — Telephone Encounter (Signed)
Okay to do speech therapy for dysphagia 4 visits  I do not see a barium swallow. I can order it if not done by PT

## 2017-08-06 NOTE — Telephone Encounter (Signed)
Copied from [place] [id number]. Topic: Quick Communication - See Telephone Encounter >> Aug 06, 2017  3:50 PM Boyd Kerbs wrote: CRM for notification. See Telephone encounter for: 08/06/17.  Kim from [facility] needing verbal orders for Speech Therapy for dysphagia 4 visits.  Asking if she had a MBS done (swallow study)

## 2017-08-06 NOTE — Telephone Encounter (Signed)
Verbal order given for speech therapy and Beth [name] stated that he will forwarded this information to the speech therapist to see if she wants to do the barium swallow there

## 2017-08-06 NOTE — Telephone Encounter (Signed)
Copied from [place] [id number]. Topic: General - Other >> Aug 06, 2017 10:11 AM Adelene Idler wrote: Skeet Simmer from [facility] called and requested verbal order for pt for 2xweek for 3 weeks , 1xweek for 1 week  Callback # [phone]

## 2017-08-07 DIAGNOSIS — G8929 Other chronic pain: Secondary | ICD-10-CM | POA: Diagnosis not present

## 2017-08-07 DIAGNOSIS — M542 Cervicalgia: Secondary | ICD-10-CM | POA: Diagnosis not present

## 2017-08-07 DIAGNOSIS — M797 Fibromyalgia: Secondary | ICD-10-CM | POA: Diagnosis not present

## 2017-08-07 DIAGNOSIS — M545 Low back pain: Secondary | ICD-10-CM | POA: Diagnosis not present

## 2017-08-07 DIAGNOSIS — G894 Chronic pain syndrome: Secondary | ICD-10-CM | POA: Diagnosis not present

## 2017-08-08 DIAGNOSIS — M545 Low back pain: Secondary | ICD-10-CM | POA: Diagnosis not present

## 2017-08-08 DIAGNOSIS — G894 Chronic pain syndrome: Secondary | ICD-10-CM | POA: Diagnosis not present

## 2017-08-08 DIAGNOSIS — M797 Fibromyalgia: Secondary | ICD-10-CM | POA: Diagnosis not present

## 2017-08-08 DIAGNOSIS — M542 Cervicalgia: Secondary | ICD-10-CM | POA: Diagnosis not present

## 2017-08-08 DIAGNOSIS — G8929 Other chronic pain: Secondary | ICD-10-CM | POA: Diagnosis not present

## 2017-08-12 DIAGNOSIS — Z8673 Personal history of transient ischemic attack (TIA), and cerebral infarction without residual deficits: Secondary | ICD-10-CM | POA: Diagnosis not present

## 2017-08-12 DIAGNOSIS — M797 Fibromyalgia: Secondary | ICD-10-CM | POA: Diagnosis not present

## 2017-08-12 DIAGNOSIS — M545 Low back pain: Secondary | ICD-10-CM | POA: Diagnosis not present

## 2017-08-12 DIAGNOSIS — G894 Chronic pain syndrome: Secondary | ICD-10-CM | POA: Diagnosis not present

## 2017-08-12 DIAGNOSIS — G8929 Other chronic pain: Secondary | ICD-10-CM | POA: Diagnosis not present

## 2017-08-12 DIAGNOSIS — M542 Cervicalgia: Secondary | ICD-10-CM | POA: Diagnosis not present

## 2017-08-13 DIAGNOSIS — M797 Fibromyalgia: Secondary | ICD-10-CM | POA: Diagnosis not present

## 2017-08-13 DIAGNOSIS — G8929 Other chronic pain: Secondary | ICD-10-CM | POA: Diagnosis not present

## 2017-08-13 DIAGNOSIS — M545 Low back pain: Secondary | ICD-10-CM | POA: Diagnosis not present

## 2017-08-13 DIAGNOSIS — M542 Cervicalgia: Secondary | ICD-10-CM | POA: Diagnosis not present

## 2017-08-13 DIAGNOSIS — G894 Chronic pain syndrome: Secondary | ICD-10-CM | POA: Diagnosis not present

## 2017-08-14 ENCOUNTER — Other Ambulatory Visit: Payer: Self-pay | Admitting: Acute Care

## 2017-08-14 DIAGNOSIS — M545 Low back pain: Secondary | ICD-10-CM | POA: Diagnosis not present

## 2017-08-14 DIAGNOSIS — M797 Fibromyalgia: Secondary | ICD-10-CM | POA: Diagnosis not present

## 2017-08-14 DIAGNOSIS — G894 Chronic pain syndrome: Secondary | ICD-10-CM | POA: Diagnosis not present

## 2017-08-14 DIAGNOSIS — M542 Cervicalgia: Secondary | ICD-10-CM | POA: Diagnosis not present

## 2017-08-14 DIAGNOSIS — I6389 Other cerebral infarction: Secondary | ICD-10-CM

## 2017-08-14 DIAGNOSIS — G8929 Other chronic pain: Secondary | ICD-10-CM | POA: Diagnosis not present

## 2017-08-15 ENCOUNTER — Telehealth: Payer: Self-pay

## 2017-08-15 DIAGNOSIS — G8929 Other chronic pain: Secondary | ICD-10-CM | POA: Diagnosis not present

## 2017-08-15 DIAGNOSIS — M797 Fibromyalgia: Secondary | ICD-10-CM | POA: Diagnosis not present

## 2017-08-15 DIAGNOSIS — M542 Cervicalgia: Secondary | ICD-10-CM | POA: Diagnosis not present

## 2017-08-15 DIAGNOSIS — G894 Chronic pain syndrome: Secondary | ICD-10-CM | POA: Diagnosis not present

## 2017-08-15 DIAGNOSIS — M545 Low back pain: Secondary | ICD-10-CM | POA: Diagnosis not present
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
## 2017-08-16 NOTE — Telephone Encounter (Signed)
This has been an ongoing problem. Tried calling patient last night and no answer.  Please try calling patient on Monday to see how she is doing Thank you

## 2017-08-18 NOTE — Telephone Encounter (Signed)
Per Dr. Ancil Boozer, I tried to contact this patient to see how she was doing but the call went straight to voicemail. A message was left for her to give [place] a call back when she got the chance.

## 2017-08-19 ENCOUNTER — Other Ambulatory Visit: Payer: Self-pay | Admitting: Neurology

## 2017-08-19 DIAGNOSIS — M545 Low back pain: Secondary | ICD-10-CM | POA: Diagnosis not present

## 2017-08-19 DIAGNOSIS — M797 Fibromyalgia: Secondary | ICD-10-CM | POA: Diagnosis not present

## 2017-08-19 DIAGNOSIS — M542 Cervicalgia: Secondary | ICD-10-CM | POA: Diagnosis not present

## 2017-08-19 DIAGNOSIS — G8929 Other chronic pain: Secondary | ICD-10-CM | POA: Diagnosis not present

## 2017-08-19 DIAGNOSIS — G894 Chronic pain syndrome: Secondary | ICD-10-CM | POA: Diagnosis not present

## 2017-08-19 DIAGNOSIS — I639 Cerebral infarction, unspecified: Secondary | ICD-10-CM

## 2017-08-20 DIAGNOSIS — G8929 Other chronic pain: Secondary | ICD-10-CM | POA: Diagnosis not present

## 2017-08-20 DIAGNOSIS — M797 Fibromyalgia: Secondary | ICD-10-CM | POA: Diagnosis not present

## 2017-08-20 DIAGNOSIS — M25522 Pain in left elbow: Secondary | ICD-10-CM | POA: Diagnosis not present

## 2017-08-20 DIAGNOSIS — M545 Low back pain: Secondary | ICD-10-CM | POA: Diagnosis not present

## 2017-08-20 DIAGNOSIS — M19122 Post-traumatic osteoarthritis, left elbow: Secondary | ICD-10-CM | POA: Diagnosis not present

## 2017-08-20 DIAGNOSIS — G894 Chronic pain syndrome: Secondary | ICD-10-CM | POA: Diagnosis not present

## 2017-08-20 DIAGNOSIS — M542 Cervicalgia: Secondary | ICD-10-CM | POA: Diagnosis not present

## 2017-08-20 DIAGNOSIS — R29898 Other symptoms and signs involving the musculoskeletal system: Secondary | ICD-10-CM | POA: Diagnosis not present

## 2017-08-20 DIAGNOSIS — G5602 Carpal tunnel syndrome, left upper limb: Secondary | ICD-10-CM | POA: Diagnosis not present

## 2017-08-21 ENCOUNTER — Telehealth: Payer: Self-pay | Admitting: Family Medicine

## 2017-08-21 DIAGNOSIS — R1310 Dysphagia, unspecified: Secondary | ICD-10-CM

## 2017-08-21 DIAGNOSIS — G8929 Other chronic pain: Secondary | ICD-10-CM | POA: Diagnosis not present

## 2017-08-21 DIAGNOSIS — G894 Chronic pain syndrome: Secondary | ICD-10-CM | POA: Diagnosis not present

## 2017-08-21 DIAGNOSIS — M542 Cervicalgia: Secondary | ICD-10-CM | POA: Diagnosis not present

## 2017-08-21 DIAGNOSIS — M797 Fibromyalgia: Secondary | ICD-10-CM | POA: Diagnosis not present

## 2017-08-21 DIAGNOSIS — Z598 Other problems related to housing and economic circumstances: Secondary | ICD-10-CM

## 2017-08-21 DIAGNOSIS — Z599 Problem related to housing and economic circumstances, unspecified: Secondary | ICD-10-CM

## 2017-08-21 DIAGNOSIS — M545 Low back pain: Secondary | ICD-10-CM | POA: Diagnosis not present

## 2017-08-21 NOTE — Telephone Encounter (Signed)
Copied from [place] [id number]. Topic: Quick Communication - See Telephone Encounter >> Aug 21, 2017  4:01 PM Rutherford Nail, NT wrote: CRM for notification. See Telephone encounter for: 08/21/17. Lorriane Shire, speech therapist with [name] calling and is wanting to discuss some things with Dr Ancil Boozer or her nurse. 1: Would like an order for medical social worker. Is needing a puree diet, but has no access to a blender. Is needing to weigh her self everyday, but has no access to scale. States that she patient does not feel well. BP was taken today and it was low. Waited and checked it 2 more times and it was normal. 2: Patient's neurologist has ordered a barium swallowing test, Lorriane Shire is requesting that a modified barium swallow test be done because patient is not eating. She is having swallowing and speech problems. States that she has placed a call to neurologist, but has not heard back. States that she is worried about patient's overall intake and sustaining herself. Please advise. CB#:[phone]

## 2017-08-22 DIAGNOSIS — G894 Chronic pain syndrome: Secondary | ICD-10-CM | POA: Diagnosis not present

## 2017-08-22 DIAGNOSIS — M542 Cervicalgia: Secondary | ICD-10-CM | POA: Diagnosis not present

## 2017-08-22 DIAGNOSIS — G8929 Other chronic pain: Secondary | ICD-10-CM | POA: Diagnosis not present

## 2017-08-22 DIAGNOSIS — M797 Fibromyalgia: Secondary | ICD-10-CM | POA: Diagnosis not present

## 2017-08-22 DIAGNOSIS — M545 Low back pain: Secondary | ICD-10-CM | POA: Diagnosis not present

## 2017-08-22 NOTE — Telephone Encounter (Signed)
Orders has been placed.

## 2017-08-22 NOTE — Telephone Encounter (Signed)
Please review

## 2017-08-22 NOTE — Telephone Encounter (Signed)
Please t-up the order for modified barium swallow Okay to order medical social worker also  Thank you

## 2017-08-27 ENCOUNTER — Telehealth: Payer: Self-pay

## 2017-08-27 DIAGNOSIS — G8929 Other chronic pain: Secondary | ICD-10-CM | POA: Diagnosis not present

## 2017-08-27 DIAGNOSIS — M545 Low back pain: Secondary | ICD-10-CM | POA: Diagnosis not present

## 2017-08-27 DIAGNOSIS — M542 Cervicalgia: Secondary | ICD-10-CM | POA: Diagnosis not present

## 2017-08-27 DIAGNOSIS — G894 Chronic pain syndrome: Secondary | ICD-10-CM | POA: Diagnosis not present

## 2017-08-27 DIAGNOSIS — M797 Fibromyalgia: Secondary | ICD-10-CM | POA: Diagnosis not present

## 2017-08-27 NOTE — Telephone Encounter (Signed)
Copied from [place] [id number]. Topic: Inquiry >> Aug 27, 2017  2:12 PM Oliver Pila B wrote: Reason for CRM: [name] called to get an extension of services to see pt; contact [phone]

## 2017-08-28 NOTE — Telephone Encounter (Signed)
yes

## 2017-08-28 NOTE — Telephone Encounter (Signed)
Gave verbal order per Dr. Ancil Boozer

## 2017-08-29 DIAGNOSIS — G8929 Other chronic pain: Secondary | ICD-10-CM | POA: Diagnosis not present

## 2017-08-29 DIAGNOSIS — G894 Chronic pain syndrome: Secondary | ICD-10-CM | POA: Diagnosis not present

## 2017-08-29 DIAGNOSIS — M545 Low back pain: Secondary | ICD-10-CM | POA: Diagnosis not present

## 2017-08-29 DIAGNOSIS — M542 Cervicalgia: Secondary | ICD-10-CM | POA: Diagnosis not present

## 2017-08-29 DIAGNOSIS — M797 Fibromyalgia: Secondary | ICD-10-CM | POA: Diagnosis not present

## 2017-09-01 DIAGNOSIS — G8929 Other chronic pain: Secondary | ICD-10-CM | POA: Diagnosis not present

## 2017-09-01 DIAGNOSIS — G894 Chronic pain syndrome: Secondary | ICD-10-CM | POA: Diagnosis not present

## 2017-09-01 DIAGNOSIS — M545 Low back pain: Secondary | ICD-10-CM | POA: Diagnosis not present

## 2017-09-01 DIAGNOSIS — M542 Cervicalgia: Secondary | ICD-10-CM | POA: Diagnosis not present

## 2017-09-01 DIAGNOSIS — M797 Fibromyalgia: Secondary | ICD-10-CM | POA: Diagnosis not present

## 2017-09-03 ENCOUNTER — Ambulatory Visit (HOSPITAL_BASED_OUTPATIENT_CLINIC_OR_DEPARTMENT_OTHER): Payer: Medicare HMO | Admitting: Student in an Organized Health Care Education/Training Program

## 2017-09-03 ENCOUNTER — Ambulatory Visit
Admission: RE | Admit: 2017-09-03 | Discharge: 2017-09-03 | Disposition: A | Discharge status: Home / Self Care | Payer: Medicare HMO | Source: Ambulatory Visit | Attending: Student in an Organized Health Care Education/Training Program | Admitting: Student in an Organized Health Care Education/Training Program

## 2017-09-03 ENCOUNTER — Other Ambulatory Visit: Payer: Self-pay

## 2017-09-03 ENCOUNTER — Encounter: Payer: Self-pay | Admitting: Student in an Organized Health Care Education/Training Program

## 2017-09-03 VITALS — BP 120/69 | HR 90 | Temp 98.4°F | Resp 20 | Ht 65.0 in | Wt 119.0 lb

## 2017-09-03 DIAGNOSIS — Z7982 Long term (current) use of aspirin: Secondary | ICD-10-CM | POA: Insufficient documentation

## 2017-09-03 DIAGNOSIS — Z79891 Long term (current) use of opiate analgesic: Secondary | ICD-10-CM | POA: Diagnosis not present

## 2017-09-03 DIAGNOSIS — Z9889 Other specified postprocedural states: Secondary | ICD-10-CM | POA: Insufficient documentation

## 2017-09-03 DIAGNOSIS — M549 Dorsalgia, unspecified: Secondary | ICD-10-CM | POA: Insufficient documentation

## 2017-09-03 DIAGNOSIS — M47816 Spondylosis without myelopathy or radiculopathy, lumbar region: Secondary | ICD-10-CM

## 2017-09-03 DIAGNOSIS — M25552 Pain in left hip: Secondary | ICD-10-CM | POA: Diagnosis not present

## 2017-09-03 DIAGNOSIS — M25551 Pain in right hip: Secondary | ICD-10-CM | POA: Insufficient documentation

## 2017-09-03 DIAGNOSIS — Z7984 Long term (current) use of oral hypoglycemic drugs: Secondary | ICD-10-CM | POA: Diagnosis not present

## 2017-09-03 DIAGNOSIS — Z888 Allergy status to other drugs, medicaments and biological substances status: Secondary | ICD-10-CM | POA: Diagnosis not present

## 2017-09-03 DIAGNOSIS — Z9049 Acquired absence of other specified parts of digestive tract: Secondary | ICD-10-CM | POA: Diagnosis not present

## 2017-09-03 MED ORDER — DEXAMETHASONE SODIUM PHOSPHATE 10 MG/ML IJ SOLN
INTRAMUSCULAR | Status: AC
  Filled 2017-09-03: qty 1

## 2017-09-03 MED ORDER — DEXAMETHASONE SODIUM PHOSPHATE 10 MG/ML IJ SOLN
10.0000 mg | Freq: Once | INTRAMUSCULAR | Status: AC
  Administered 2017-09-03: 10 mg

## 2017-09-03 MED ORDER — FENTANYL CITRATE (PF) 100 MCG/2ML IJ SOLN
25.0000 ug | INTRAMUSCULAR | Status: DC | PRN
  Administered 2017-09-03: 25 ug via INTRAVENOUS

## 2017-09-03 MED ORDER — ROPIVACAINE HCL 2 MG/ML IJ SOLN
10.0000 mL | Freq: Once | INTRAMUSCULAR | Status: AC
  Administered 2017-09-03: 10 mL

## 2017-09-03 MED ORDER — LACTATED RINGERS IV SOLN
1000.0000 mL | Freq: Once | INTRAVENOUS | Status: AC
  Administered 2017-09-03: 1000 mL via INTRAVENOUS

## 2017-09-03 MED ORDER — LIDOCAINE HCL 2 % IJ SOLN
INTRAMUSCULAR | Status: AC
  Filled 2017-09-03: qty 20

## 2017-09-03 MED ORDER — LIDOCAINE HCL 2 % IJ SOLN
20.0000 mL | Freq: Once | INTRAMUSCULAR | Status: AC
  Administered 2017-09-03: 400 mg

## 2017-09-03 MED ORDER — FENTANYL CITRATE (PF) 100 MCG/2ML IJ SOLN
INTRAMUSCULAR | Status: AC
  Filled 2017-09-03: qty 2

## 2017-09-03 NOTE — Progress Notes (Signed)
Patient's Name: Beth [name]  MRN: [id number]  Referring Provider: Steele Sizer, MD  DOB: 1945-11-05  PCP: Steele Sizer, MD  DOS: 09/03/2017  Note by: [name], MD  Service setting: Ambulatory outpatient  Specialty: Interventional Pain Management  Patient type: Established  Location: [facility] (AMB) Pain Management Facility  Visit type: Interventional Procedure   Primary Reason for Visit: Interventional Pain Management Treatment. CC: Back Pain (low) and Hip Pain (bilateral)  Procedure:          Anesthesia, Analgesia, Anxiolysis:  Type: Lumbar Facet, Medial Branch Block(s) #1  Primary Purpose: Diagnostic Region: Posterolateral Lumbosacral Spine Level: L3, L4, L5, Medial Branch Level(s). Injecting these levels blocks the L3-4, L4-5,  lumbar facet joints. Laterality: Bilateral  Type: Moderate (Conscious) Sedation combined with Local Anesthesia Indication(s): Analgesia and Anxiety Route: Intravenous (IV) IV Access: Secured Sedation: Meaningful verbal contact was maintained at all times during the procedure  Local Anesthetic: Lidocaine 1-2%   Indications: 1. Lumbar spondylosis    Pain Score: Pre-procedure: 7 /10 Post-procedure: 0-No pain/10  Pre-op Assessment:  [name] is a 72 y.o. (year old), female patient, seen today for interventional treatment. She  has a past surgical history that includes Breast surgery (2011); Cervical discectomy; Appendectomy; Abdominal hysterectomy; Cholecystectomy; and Sinusotomy. [name] has a current medication list which includes the following prescription(s): acetaminophen, aripiprazole, aspirin, atorvastatin, chlorhexidine, duloxetine, fluticasone furoate-vilanterol, gabapentin, ipratropium-albuterol, metoprolol succinate, sertraline, and valacyclovir, and the following Facility-Administered Medications: fentanyl. Her primarily concern today is the Back Pain (low) and Hip Pain (bilateral)  Initial Vital Signs:  Pulse/HCG Rate: 94ECG Heart  Rate: 88 Temp: 98.2 F (36.8 C) Resp: 18 BP: (!) 108/57 SpO2: 95 %  BMI: Estimated body mass index is 19.8 kg/m as calculated from the following:   Height as of this encounter: 5\' 5"  (1.651 m).   Weight as of this encounter: 119 lb (54 kg).  Risk Assessment: Allergies: Reviewed. She is allergic to bextra  [valdecoxib]; compazine  [prochlorperazine edisylate]; lithium carbonate; and lyrica [pregabalin].  Allergy Precautions: None required Coagulopathies: Reviewed. None identified.  Blood-thinner therapy: None at this time Active Infection(s): Reviewed. None identified. [name] is afebrile  Site Confirmation: [name] was asked to confirm the procedure and laterality before marking the site Procedure checklist: Completed Consent: Before the procedure and under the influence of no sedative(s), amnesic(s), or anxiolytics, the patient was informed of the treatment options, risks and possible complications. To fulfill our ethical and legal obligations, as recommended by the American Medical Association's Code of Ethics, I have informed the patient of my clinical impression; the nature and purpose of the treatment or procedure; the risks, benefits, and possible complications of the intervention; the alternatives, including doing nothing; the risk(s) and benefit(s) of the alternative treatment(s) or procedure(s); and the risk(s) and benefit(s) of doing nothing. The patient was provided information about the general risks and possible complications associated with the procedure. These may include, but are not limited to: failure to achieve desired goals, infection, bleeding, organ or nerve damage, allergic reactions, paralysis, and death. In addition, the patient was informed of those risks and complications associated to Spine-related procedures, such as failure to decrease pain; infection (i.e.: Meningitis, epidural or intraspinal abscess); bleeding (i.e.: epidural hematoma, subarachnoid  hemorrhage, or any other type of intraspinal or peri-dural bleeding); organ or nerve damage (i.e.: Any type of peripheral nerve, nerve root, or spinal cord injury) with subsequent damage to sensory, motor, and/or autonomic systems, resulting in permanent pain, numbness, and/or weakness of  one or several areas of the body; allergic reactions; (i.e.: anaphylactic reaction); and/or death. Furthermore, the patient was informed of those risks and complications associated with the medications. These include, but are not limited to: allergic reactions (i.e.: anaphylactic or anaphylactoid reaction(s)); adrenal axis suppression; blood sugar elevation that in diabetics may result in ketoacidosis or comma; water retention that in patients with history of congestive heart failure may result in shortness of breath, pulmonary edema, and decompensation with resultant heart failure; weight gain; swelling or edema; medication-induced neural toxicity; particulate matter embolism and blood vessel occlusion with resultant organ, and/or nervous system infarction; and/or aseptic necrosis of one or more joints. Finally, the patient was informed that Medicine is not an exact science; therefore, there is also the possibility of unforeseen or unpredictable risks and/or possible complications that may result in a catastrophic outcome. The patient indicated having understood very clearly. We have given the patient no guarantees and we have made no promises. Enough time was given to the patient to ask questions, all of which were answered to the patient's satisfaction. [name] has indicated that she wanted to continue with the procedure. Attestation: I, the ordering provider, attest that I have discussed with the patient the benefits, risks, side-effects, alternatives, likelihood of achieving goals, and potential problems during recovery for the procedure that I have provided informed consent. Date  Time: 09/03/2017 11:29  AM  Pre-Procedure Preparation:  Monitoring: As per clinic protocol. Respiration, ETCO2, SpO2, BP, heart rate and rhythm monitor placed and checked for adequate function Safety Precautions: Patient was assessed for positional comfort and pressure points before starting the procedure. Time-out: I initiated and conducted the "Time-out" before starting the procedure, as per protocol. The patient was asked to participate by confirming the accuracy of the "Time Out" information. Verification of the correct person, site, and procedure were performed and confirmed by me, the nursing staff, and the patient. "Time-out" conducted as per Joint Commission's Universal Protocol (UP.01.01.01). Time: 1220  Description of Procedure:          Position: Prone Laterality: Bilateral. The procedure was performed in identical fashion on both sides. Levels:  L3, L4, L5,  Medial Branch Level(s) Area Prepped: Posterior Lumbosacral Region Prepping solution: ChloraPrep (2% chlorhexidine gluconate and 70% isopropyl alcohol) Safety Precautions: Aspiration looking for blood return was conducted prior to all injections. At no point did we inject any substances, as a needle was being advanced. Before injecting, the patient was told to immediately notify me if she was experiencing any new onset of "ringing in the ears, or metallic taste in the mouth". No attempts were made at seeking any paresthesias. Safe injection practices and needle disposal techniques used. Medications properly checked for expiration dates. SDV (single dose vial) medications used. After the completion of the procedure, all disposable equipment used was discarded in the proper designated medical waste containers. Local Anesthesia: Protocol guidelines were followed. The patient was positioned over the fluoroscopy table. The area was prepped in the usual manner. The time-out was completed. The target area was identified using fluoroscopy. A 12-in long, straight,  sterile hemostat was used with fluoroscopic guidance to locate the targets for each level blocked. Once located, the skin was marked with an approved surgical skin marker. Once all sites were marked, the skin (epidermis, dermis, and hypodermis), as well as deeper tissues (fat, connective tissue and muscle) were infiltrated with a small amount of a short-acting local anesthetic, loaded on a 10cc syringe with a 25G, 1.5-in  Needle. An  appropriate amount of time was allowed for local anesthetics to take effect before proceeding to the next step. Local Anesthetic: Lidocaine 2.0% The unused portion of the local anesthetic was discarded in the proper designated containers. Technical explanation of process:   L3 Medial Branch Nerve Block (MBB): The target area for the L3 medial branch is at the junction of the postero-lateral aspect of the superior articular process and the superior, posterior, and medial edge of the transverse process of L4. Under fluoroscopic guidance, a Quincke needle was inserted until contact was made with os over the superior postero-lateral aspect of the pedicular shadow (target area). After negative aspiration for blood, 1 mL of the nerve block solution was injected without difficulty or complication. The needle was removed intact. L4 Medial Branch Nerve Block (MBB): The target area for the L4 medial branch is at the junction of the postero-lateral aspect of the superior articular process and the superior, posterior, and medial edge of the transverse process of L5. Under fluoroscopic guidance, a Quincke needle was inserted until contact was made with os over the superior postero-lateral aspect of the pedicular shadow (target area). After negative aspiration for blood,1 mL of the nerve block solution was injected without difficulty or complication. The needle was removed intact. L5 Medial Branch Nerve Block (MBB): The target area for the L5 medial branch is at the junction of the  postero-lateral aspect of the superior articular process and the superior, posterior, and medial edge of the sacral ala. Under fluoroscopic guidance, a Quincke needle was inserted until contact was made with os over the superior postero-lateral aspect of the pedicular shadow (target area). After negative aspiration for blood, 1 mL of the nerve block solution was injected without difficulty or complication. The needle was removed intact.  Procedural Needles: 22-gauge, 3.5-inch, Quincke needles used for all levels. Nerve block solution: 10 cc solution made of 9 cc of 0.2% ropivacaine, 1 cc of Decadron 10 mg/cc.  1 to 1.5 cc injected at each level above bilaterally.  The unused portion of the solution was discarded in the proper designated containers.  Once the entire procedure was completed, the treated area was cleaned, making sure to leave some of the prepping solution back to take advantage of its long term bactericidal properties.   Illustration of the posterior view of the lumbar spine and the posterior neural structures. Laminae of L2 through S1 are labeled. DPRL5, dorsal primary ramus of L5; DPRS1, dorsal primary ramus of S1; DPR3, dorsal primary ramus of L3; FJ, facet (zygapophyseal) joint L3-L4; I, inferior articular process of L4; LB1, lateral branch of dorsal primary ramus of L1; IAB, inferior articular branches from L3 medial branch (supplies L4-L5 facet joint); IBP, intermediate branch plexus; MB3, medial branch of dorsal primary ramus of L3; NR3, third lumbar nerve root; S, superior articular process of L5; SAB, superior articular branches from L4 (supplies L4-5 facet joint also); TP3, transverse process of L3.  Vitals:   09/03/17 1235 09/03/17 1245 09/03/17 1255 09/03/17 1305  BP: 116/60 (!) 115/53 108/60 120/69  Pulse: 90     Resp: 18 14 18 20   Temp:    98.4 F (36.9 C)  TempSrc:      SpO2: 93% 92% 94% 98%  Weight:      Height:        Start Time: 1220 hrs. End Time: 1235  hrs.  Imaging Guidance (Spinal):          Type of Imaging Technique: Fluoroscopy Guidance (Spinal) Indication(s):  Assistance in needle guidance and placement for procedures requiring needle placement in or near specific anatomical locations not easily accessible without such assistance. Exposure Time: Please see nurses notes. Contrast: None used. Fluoroscopic Guidance: I was personally present during the use of fluoroscopy. "Tunnel Vision Technique" used to obtain the best possible view of the target area. Parallax error corrected before commencing the procedure. "Direction-depth-direction" technique used to introduce the needle under continuous pulsed fluoroscopy. Once target was reached, antero-posterior, oblique, and lateral fluoroscopic projection used confirm needle placement in all planes. Images permanently stored in EMR. Interpretation: No contrast injected. I personally interpreted the imaging intraoperatively. Adequate needle placement confirmed in multiple planes. Permanent images saved into the patient's record.  Antibiotic Prophylaxis:   Anti-infectives (From admission, onward)   None     Indication(s): None identified  Post-operative Assessment:  Post-procedure Vital Signs:  Pulse/HCG Rate: 9081 Temp: 98.4 F (36.9 C) Resp: 20 BP: 120/69 SpO2: 98 %  EBL: None  Complications: No immediate post-treatment complications observed by team, or reported by patient.  Note: The patient tolerated the entire procedure well. A repeat set of vitals were taken after the procedure and the patient was kept under observation following institutional policy, for this type of procedure. Post-procedural neurological assessment was performed, showing return to baseline, prior to discharge. The patient was provided with post-procedure discharge instructions, including a section on how to identify potential problems. Should any problems arise concerning this procedure, the patient was given  instructions to immediately contact us, at any time, without hesitation. In any case, we plan to contact the patient by telephone for a follow-up status report regarding this interventional procedure.  Comments:  No additional relevant information. 5 out of 5 strength bilateral lower extremity: Plantar flexion, dorsiflexion, knee flexion, knee extension. Plan of Care   Imaging Orders     DG C-Arm 1-60 Min-No Report Procedure Orders    No procedure(s) ordered today    Medications ordered for procedure: Meds ordered this encounter  Medications  . lactated ringers infusion 1,000 mL  . fentaNYL (SUBLIMAZE) injection 25-100 mcg    Make sure Narcan is available in the pyxis when using this medication. In the event of respiratory depression (RR< 8/min): Titrate NARCAN (naloxone) in increments of 0.1 to 0.2 mg IV at 2-3 minute intervals, until desired degree of reversal.  . ropivacaine (PF) 2 mg/mL (0.2%) (NAROPIN) injection 10 mL  . lidocaine (XYLOCAINE) 2 % (with pres) injection 400 mg  . dexamethasone (DECADRON) injection 10 mg   Medications administered: We administered lactated ringers, fentaNYL, ropivacaine (PF) 2 mg/mL (0.2%), lidocaine, and dexamethasone.  See the medical record for exact dosing, route, and time of administration.  New Prescriptions   No medications on file   Disposition: Discharge home  Discharge Date & Time: 09/03/2017; 1310 hrs.   Physician-requested Follow-up: Return in about 1 month (around 10/07/2017) for Post Procedure Evaluation.  Future Appointments  Date Time Provider [place]  09/04/2017  1:00 PM [facility]  09/05/2017  2:00 PM [facility]  10/07/2017 12:15 PM [name], MD [facility] None   Primary Care Physician: Steele Sizer, MD Location: [facility] Note by: [name], MD Date: 09/03/2017; Time: 2:05 PM  Disclaimer:  Medicine is not an exact science. The only guarantee in  medicine is that nothing is guaranteed. It is important to note that the decision to proceed with this intervention was based on the information collected from the patient. The  Data and conclusions were drawn from the patient's questionnaire, the interview, and the physical examination. Because the information was provided in large part by the patient, it cannot be guaranteed that it has not been purposely or unconsciously manipulated. Every effort has been made to obtain as much relevant data as possible for this evaluation. It is important to note that the conclusions that lead to this procedure are derived in large part from the available data. Always take into account that the treatment will also be dependent on availability of resources and existing treatment guidelines, considered by other Pain Management Practitioners as being common knowledge and practice, at the time of the intervention. For Medico-Legal purposes, it is also important to point out that variation in procedural techniques and pharmacological choices are the acceptable norm. The indications, contraindications, technique, and results of the above procedure should only be interpreted and judged by a Board-Certified Interventional Pain Specialist with extensive familiarity and expertise in the same exact procedure and technique.

## 2017-09-03 NOTE — Progress Notes (Signed)
Safety precautions to be maintained throughout the outpatient stay will include: orient to surroundings, keep bed in low position, maintain call bell within reach at all times, provide assistance with transfer out of bed and ambulation.  

## 2017-09-03 NOTE — Patient Instructions (Signed)

## 2017-09-04 ENCOUNTER — Telehealth: Payer: Self-pay

## 2017-09-04 ENCOUNTER — Ambulatory Visit
Admission: RE | Admit: 2017-09-04 | Discharge: 2017-09-04 | Disposition: A | Discharge status: Home / Self Care | Payer: Medicare HMO | Source: Ambulatory Visit | Attending: Neurology | Admitting: Neurology

## 2017-09-04 ENCOUNTER — Other Ambulatory Visit: Payer: Self-pay | Admitting: Acute Care

## 2017-09-04 DIAGNOSIS — M542 Cervicalgia: Secondary | ICD-10-CM | POA: Diagnosis not present

## 2017-09-04 DIAGNOSIS — I6389 Other cerebral infarction: Secondary | ICD-10-CM

## 2017-09-04 DIAGNOSIS — G8929 Other chronic pain: Secondary | ICD-10-CM | POA: Diagnosis not present

## 2017-09-04 DIAGNOSIS — R1310 Dysphagia, unspecified: Secondary | ICD-10-CM | POA: Diagnosis not present

## 2017-09-04 DIAGNOSIS — I639 Cerebral infarction, unspecified: Secondary | ICD-10-CM

## 2017-09-04 DIAGNOSIS — R1312 Dysphagia, oropharyngeal phase: Secondary | ICD-10-CM | POA: Insufficient documentation

## 2017-09-04 DIAGNOSIS — Z8673 Personal history of transient ischemic attack (TIA), and cerebral infarction without residual deficits: Secondary | ICD-10-CM | POA: Diagnosis not present

## 2017-09-04 DIAGNOSIS — G894 Chronic pain syndrome: Secondary | ICD-10-CM | POA: Diagnosis not present

## 2017-09-04 DIAGNOSIS — M545 Low back pain: Secondary | ICD-10-CM | POA: Diagnosis not present

## 2017-09-04 DIAGNOSIS — M797 Fibromyalgia: Secondary | ICD-10-CM | POA: Diagnosis not present

## 2017-09-04 NOTE — Telephone Encounter (Signed)
Pt was called and no problem was reported. 

## 2017-09-04 NOTE — Therapy (Signed)
[place], [place], [address] Phone: [phone]   Fax:     Modified Barium Swallow  Patient Details  Name: Beth [name] MRN: [id number] Date of Birth: [birth year] No data recorded  Encounter Date: 09/04/2017  End of Session - 09/04/17 1536    Visit Number  1    Number of Visits  1    Date for SLP Re-Evaluation  09/04/17    SLP Start Time  27    SLP Stop Time   1400    SLP Time Calculation (min)  60 min    Activity Tolerance  Patient tolerated treatment well       Past Medical History:  Diagnosis Date  . Allergy   . Anxiety   . Asthma   . Chronic pain syndrome    discharged from pain clinic, hx of narcotics seeking behavior  . COPD (chronic obstructive pulmonary disease) ([place])   . CVA (cerebral infarction)   . Depression   . Fibromyalgia   . Headache   . Hyperlipidemia   . Hypertension   . IBS (irritable bowel syndrome)   . Stroke ([place])   . Vitamin D deficiency     Past Surgical History:  Procedure Laterality Date  . ABDOMINAL HYSTERECTOMY    . APPENDECTOMY    . BREAST SURGERY  2011   biopsy  . CERVICAL DISCECTOMY    . CHOLECYSTECTOMY    . SINUSOTOMY      There were no vitals filed for this visit.      Subjective: Patient behavior: (alertness, ability to follow instructions, etc.): Chief complaint: dysphagia   Objective:  Radiological Procedure: A videoflouroscopic evaluation of oral-preparatory, reflex initiation, and pharyngeal phases of the swallow was performed; as well as a screening of the upper esophageal phase.  I. POSTURE: II. VIEW: III. COMPENSATORY STRATEGIES: IV. BOLUSES ADMINISTERED:  Thin Liquid:  Nectar-thick Liquid:  Honey-thick Liquid:  Puree:  Mechanical Soft: V. RESULTS OF EVALUATION: A. ORAL PREPARATORY PHASE: (The lips, tongue, and velum are observed for strength and coordination)       **Overall Severity Rating:  B. SWALLOW  INITIATION/REFLEX: (The reflex is normal if "triggered" by the time the bolus reached the base of the tongue)  **Overall Severity Rating:  C. PHARYNGEAL PHASE: (Pharyngeal function is normal if the bolus shows rapid, smooth, and continuous transit through the pharynx and there is no pharyngeal residue after the swallow)  **Overall Severity Rating:  D. LARYNGEAL PENETRATION: (Material entering into the laryngeal inlet/vestibule but not aspirated): "flash" x1 - appeared to clear w/ completion of swallow E. ASPIRATION: NONE F. ESOPHAGEAL PHASE: (Screening of the upper esophagus)  ASSESSMENT:  PLAN/RECOMMENDATIONS:  A. Diet: Mech Soft foods (especially while w/ denture issues) w/ Thin liquids; Pills Whole in Puree for safer swallowing  B. Swallowing Precautions: USE A CHIN TUCK during swallowing of THIN LIQUIDS; general aspiration precautions  C. Recommended consultation to; dentist re: dentures; continue f/u w/ Neurology   D. Therapy recommendations: were discussed w/ pt; Son. Alesia Banda strategy was practiced w/ pt and Son. Recommend f/u w/ ST services for Speech(articulation) Therapy   E. Results and recommendations were discussed w/ pt and Son; education given on recommendations; video viewed w/ both and discussed            Dysphagia, oropharyngeal phase  Cerebrovascular accident (CVA), unspecified mechanism ([place]) - Plan: DG OP Swallowing Func-Medicare/Speech Path, DG OP Swallowing Func-Medicare/Speech Path  Problem List Patient Active Problem List   Diagnosis Date Noted  . Urinary tract infection 12/05/2016  . Protein-calorie malnutrition, mild ([place]) 08/07/2016  . Generalized anxiety disorder 07/15/2016  . Chronic neck and back pain 07/15/2016  . Severe episode of recurrent major depressive disorder, without psychotic features ([place]) 07/14/2016  . Moderate benzodiazepine use disorder ([place]) 05/08/2016  . Major depressive disorder, recurrent, severe w/o psychotic  behavior ([place]) 05/01/2016  . Seasonal allergic rhinitis 03/23/2015  . Hyperglycemia 03/23/2015  . Senile purpura ([place]) 03/23/2015  . Perennial allergic rhinitis with seasonal variation 03/23/2015  . Marital problems 10/31/2014  . Migraine without aura and without status migrainosus, not intractable 10/31/2014  . Chronic LBP 08/09/2014  . Colon polyp 08/09/2014  . COPD, severe ([place]) 08/09/2014  . CVA, old, hemiparesis ([place]) 08/09/2014  . Dyslipidemia 08/09/2014  . Dysfunction of eustachian tube 08/09/2014  . Fibromyalgia syndrome 08/09/2014  . Gastro-esophageal reflux disease without esophagitis 08/09/2014  . Benign migrating glossitis 08/09/2014  . Cerebrovascular accident, old 08/09/2014  . IBS (irritable bowel syndrome) 08/09/2014  . Low back pain with radiation 08/09/2014  . Chronic recurrent major depressive disorder ([place]) 08/09/2014  . Dysmetabolic syndrome 29/47/6546  . OP (osteoporosis) 08/09/2014  . Vitamin D deficiency 08/09/2014  . Benign hypertension 07/19/2013  . Benign neoplasm of skin of trunk 06/03/2013  . H/O: pneumonia 09/25/2012      Orinda Kenner, MS, CCC-SLP Dealie Koelzer 09/04/2017, 3:37 PM  [facility] [place], [place], [address] Phone: [phone]   Fax:     Name: UZMA HELLMER MRN: [id number] Date of Birth: [birth year]

## 2017-09-05 ENCOUNTER — Ambulatory Visit: Admission: RE | Admit: 2017-09-05 | Payer: Medicare HMO | Source: Ambulatory Visit

## 2017-09-05 DIAGNOSIS — M797 Fibromyalgia: Secondary | ICD-10-CM | POA: Diagnosis not present

## 2017-09-05 DIAGNOSIS — G8929 Other chronic pain: Secondary | ICD-10-CM | POA: Diagnosis not present

## 2017-09-05 DIAGNOSIS — M542 Cervicalgia: Secondary | ICD-10-CM | POA: Diagnosis not present

## 2017-09-05 DIAGNOSIS — M545 Low back pain: Secondary | ICD-10-CM | POA: Diagnosis not present

## 2017-09-05 DIAGNOSIS — G894 Chronic pain syndrome: Secondary | ICD-10-CM | POA: Diagnosis not present

## 2017-09-08 ENCOUNTER — Ambulatory Visit
Admission: RE | Admit: 2017-09-08 | Discharge: 2017-09-08 | Disposition: A | Discharge status: Home / Self Care | Payer: Medicare HMO | Source: Ambulatory Visit | Attending: Nurse Practitioner | Admitting: Nurse Practitioner

## 2017-09-08 ENCOUNTER — Ambulatory Visit (INDEPENDENT_AMBULATORY_CARE_PROVIDER_SITE_OTHER): Payer: Medicare HMO | Admitting: Nurse Practitioner

## 2017-09-08 ENCOUNTER — Encounter: Payer: Self-pay | Admitting: Nurse Practitioner

## 2017-09-08 ENCOUNTER — Other Ambulatory Visit: Payer: Self-pay | Admitting: Nurse Practitioner

## 2017-09-08 VITALS — BP 140/70 | HR 110 | Temp 98.8°F | Resp 14 | Ht 65.0 in | Wt 113.9 lb

## 2017-09-08 DIAGNOSIS — R911 Solitary pulmonary nodule: Secondary | ICD-10-CM | POA: Insufficient documentation

## 2017-09-08 DIAGNOSIS — R062 Wheezing: Secondary | ICD-10-CM

## 2017-09-08 DIAGNOSIS — J441 Chronic obstructive pulmonary disease with (acute) exacerbation: Secondary | ICD-10-CM

## 2017-09-08 DIAGNOSIS — R634 Abnormal weight loss: Secondary | ICD-10-CM | POA: Diagnosis not present

## 2017-09-08 DIAGNOSIS — R918 Other nonspecific abnormal finding of lung field: Secondary | ICD-10-CM

## 2017-09-08 DIAGNOSIS — R058 Other specified cough: Secondary | ICD-10-CM

## 2017-09-08 DIAGNOSIS — R0602 Shortness of breath: Secondary | ICD-10-CM | POA: Diagnosis not present

## 2017-09-08 DIAGNOSIS — J42 Unspecified chronic bronchitis: Secondary | ICD-10-CM | POA: Diagnosis not present

## 2017-09-08 DIAGNOSIS — R05 Cough: Secondary | ICD-10-CM

## 2017-09-08 DIAGNOSIS — R0981 Nasal congestion: Secondary | ICD-10-CM

## 2017-09-08 MED ORDER — PREDNISONE 20 MG PO TABS: 20.0000 mg | ORAL_TABLET | Freq: Every day | ORAL | 0 refills | Status: DC

## 2017-09-08 MED ORDER — FLUTICASONE FUROATE-VILANTEROL 100-25 MCG/INH IN AEPB: 1.0000 | INHALATION_SPRAY | Freq: Every day | RESPIRATORY_TRACT | 5 refills | Status: DC

## 2017-09-08 MED ORDER — LEVALBUTEROL HCL 1.25 MG/0.5ML IN NEBU: 1.2500 mg | INHALATION_SOLUTION | RESPIRATORY_TRACT | Status: AC

## 2017-09-08 MED ORDER — BENZONATATE 100 MG PO CAPS: 100.0000 mg | ORAL_CAPSULE | Freq: Three times a day (TID) | ORAL | 0 refills | Status: DC | PRN

## 2017-09-08 MED ORDER — ALBUTEROL SULFATE (2.5 MG/3ML) 0.083% IN NEBU: 2.5000 mg | INHALATION_SOLUTION | Freq: Once | RESPIRATORY_TRACT | Status: DC

## 2017-09-08 MED ORDER — IPRATROPIUM-ALBUTEROL 20-100 MCG/ACT IN AERS: INHALATION_SPRAY | RESPIRATORY_TRACT | 0 refills | Status: DC

## 2017-09-08 MED ORDER — DOXYCYCLINE HYCLATE 100 MG PO TABS: 100.0000 mg | ORAL_TABLET | Freq: Two times a day (BID) | ORAL | 0 refills | Status: DC

## 2017-09-08 MED ORDER — FLUTICASONE PROPIONATE 50 MCG/ACT NA SUSP: 2.0000 | Freq: Every day | NASAL | 6 refills | Status: DC

## 2017-09-08 NOTE — Progress Notes (Addendum)
xopName: Beth [name]   MRN: [id number]    DOB: 1945-08-22   Date:09/08/2017       Progress Note  Subjective  Chief Complaint  Chief Complaint  Patient presents with  . URI    for 2 weeks    HPI  States 2 weeks of coughing has worsened and is coughing up medium amounts of thick green sputum. Has some wheezing with accompanying shortness of breath and chest tightness when she walks around or has to exert herself. Has tried musinex and robitussen at home without any relief of symptoms. Denies fever, chills, sore throat, eye drainage, ear pain.   Patient has COPD; takes breo every day but states she is almost out and takes combivent every 6 hours.   Did not take metoprolol today or yesterday because PT said her BP was low. Patient resting HR from the last few visits have been 90-100's.   Requires pureed diet, awaiting barium swallow to assess difficulty swallowing.  Wt Readings from Last 3 Encounters:  09/08/17 113 lb 14.4 oz (51.7 kg)  09/03/17 119 lb (54 kg)  07/28/17 124 lb 12.8 oz (56.6 kg)     Patient Active Problem List   Diagnosis Date Noted  . Urinary tract infection 12/05/2016  . Protein-calorie malnutrition, mild ([place]) 08/07/2016  . Generalized anxiety disorder 07/15/2016  . Chronic neck and back pain 07/15/2016  . Severe episode of recurrent major depressive disorder, without psychotic features ([place]) 07/14/2016  . Moderate benzodiazepine use disorder ([place]) 05/08/2016  . Major depressive disorder, recurrent, severe w/o psychotic behavior ([place]) 05/01/2016  . Seasonal allergic rhinitis 03/23/2015  . Hyperglycemia 03/23/2015  . Senile purpura ([place]) 03/23/2015  . Perennial allergic rhinitis with seasonal variation 03/23/2015  . Marital problems 10/31/2014  . Migraine without aura and without status migrainosus, not intractable 10/31/2014  . Chronic LBP 08/09/2014  . Colon polyp 08/09/2014  . COPD, severe ([place]) 08/09/2014  . CVA, old, hemiparesis ([place]) 08/09/2014  .  Dyslipidemia 08/09/2014  . Dysfunction of eustachian tube 08/09/2014  . Fibromyalgia syndrome 08/09/2014  . Gastro-esophageal reflux disease without esophagitis 08/09/2014  . Benign migrating glossitis 08/09/2014  . Cerebrovascular accident, old 08/09/2014  . IBS (irritable bowel syndrome) 08/09/2014  . Low back pain with radiation 08/09/2014  . Chronic recurrent major depressive disorder ([place]) 08/09/2014  . Dysmetabolic syndrome 89/37/3428  . OP (osteoporosis) 08/09/2014  . Vitamin D deficiency 08/09/2014  . Benign hypertension 07/19/2013  . Benign neoplasm of skin of trunk 06/03/2013  . H/O: pneumonia 09/25/2012    Past Medical History:  Diagnosis Date  . Allergy   . Anxiety   . Asthma   . Chronic pain syndrome    discharged from pain clinic, hx of narcotics seeking behavior  . COPD (chronic obstructive pulmonary disease) ([place])   . CVA (cerebral infarction)   . Depression   . Fibromyalgia   . Headache   . Hyperlipidemia   . Hypertension   . IBS (irritable bowel syndrome)   . Stroke ([place])   . Vitamin D deficiency     Past Surgical History:  Procedure Laterality Date  . ABDOMINAL HYSTERECTOMY    . APPENDECTOMY    . BREAST SURGERY  2011   biopsy  . CERVICAL DISCECTOMY    . CHOLECYSTECTOMY    . SINUSOTOMY      Social History   Tobacco Use  . Smoking status: Current Some Day Smoker    Packs/day: 0.75    Years: 35.00    Pack  years: 26.25    Types: Cigarettes  . Smokeless tobacco: Never Used  Substance Use Topics  . Alcohol use: No    Alcohol/week: 0.0 standard drinks     Current Outpatient Medications:  .  acetaminophen (TYLENOL) 325 MG tablet, Take 650 mg by mouth every 4 (four) hours as needed., Disp: , Rfl:  .  ARIPiprazole (ABILIFY) 10 MG tablet, Take 10 mg by mouth at bedtime., Disp: , Rfl: 0 .  aspirin 81 MG chewable tablet, Chew 81 mg daily by mouth., Disp: , Rfl:  .  atorvastatin (LIPITOR) 40 MG tablet, TAKE 1 TABLET(40 MG) BY MOUTH DAILY, Disp:  90 tablet, Rfl: 1 .  azelastine (ASTELIN) 0.1 % nasal spray, Place 1 spray into the nose daily., Disp: , Rfl:  .  chlorhexidine (PERIDEX) 0.12 % solution, 30 mLs by Mouth Rinse route as needed., Disp: , Rfl: 1 .  DULoxetine (CYMBALTA) 20 MG capsule, Take 2 capsules (40 mg total) by mouth daily., Disp: 60 capsule, Rfl: 3 .  fluticasone furoate-vilanterol (BREO ELLIPTA) 100-25 MCG/INH AEPB, Inhale 1 puff into the lungs daily., Disp: 60 each, Rfl: 5 .  gabapentin (NEURONTIN) 300 MG capsule, Take 2 capsules (600 mg total) by mouth 3 (three) times daily., Disp: 180 capsule, Rfl: 3 .  Ipratropium-Albuterol (COMBIVENT RESPIMAT) 20-100 MCG/ACT AERS respimat, INHALE 1 PUFF BY MOUTH EVERY 6 HOURS. MUST LAST 3 MONTHS, Disp: 1 Inhaler, Rfl: 0 .  metoprolol succinate (TOPROL-XL) 25 MG 24 hr tablet, Take 12.5 mg by mouth daily. , Disp: , Rfl: 1 .  sertraline (ZOLOFT) 50 MG tablet, Take 50 mg by mouth every morning., Disp: , Rfl: 0 .  valACYclovir (VALTREX) 1000 MG tablet, Take 1 tablet (1,000 mg total) by mouth 2 (two) times daily. (Patient not taking: Reported on 09/03/2017), Disp: 10 tablet, Rfl: 0  Allergies  Allergen Reactions  . Bextra  [Valdecoxib]   . Compazine  [Prochlorperazine Edisylate]   . Lithium Carbonate   . Lyrica [Pregabalin]     Review of Systems  Constitutional: Positive for malaise/fatigue and weight loss. Negative for chills, diaphoresis and fever.  HENT: Positive for congestion and sinus pain. Negative for ear pain, nosebleeds and sore throat.   Eyes: Negative for blurred vision, double vision and redness.  Respiratory: Positive for cough, sputum production, shortness of breath and wheezing. Negative for hemoptysis.   Cardiovascular: Positive for chest pain (tightness).  Gastrointestinal: Negative for abdominal pain.  Musculoskeletal: Negative for myalgias.  Skin: Negative for rash.  Neurological: Positive for dizziness (baseline, unchanged) and headaches. Negative for focal  weakness. Speech change: no new focal weakness.  Psychiatric/Behavioral: Positive for depression (stable follows up with psych). The patient is nervous/anxious (stable follows up with psych).     No other specific complaints in a complete review of systems (except as listed in HPI above).  Objective  Vitals:   09/08/17 1330  BP: 140/70  Pulse: (!) 110  Resp: 14  Temp: 98.8 F (37.1 C)  TempSrc: Oral  SpO2: 93%  Weight: 113 lb 14.4 oz (51.7 kg)  Height: 5\' 5"  (1.651 m)    Body mass index is 18.95 kg/m.  Nursing Note and Vital Signs reviewed.  Physical Exam  Constitutional: She is cooperative.  Thin and frail, able to speak full sentences without issue   HENT:  Head: Normocephalic.  Right Ear: Hearing, tympanic membrane, external ear and ear canal normal.  Left Ear: Hearing, tympanic membrane, external ear and ear canal normal.  Nose: Mucosal edema and  rhinorrhea present. Right sinus exhibits no maxillary sinus tenderness and no frontal sinus tenderness. Left sinus exhibits no maxillary sinus tenderness and no frontal sinus tenderness.  Mouth/Throat: Oropharynx is clear and moist. Mucous membranes are dry.  Eyes: Pupils are equal, round, and reactive to light. Conjunctivae and EOM are normal. Right eye exhibits no discharge. Left eye exhibits no discharge.  Neck: No thyromegaly present.  Cardiovascular: Normal rate and normal heart sounds.  Mildly elevated rate   Pulmonary/Chest: Effort normal and breath sounds normal.  Abdominal: Soft. Normal appearance and bowel sounds are normal.  Lymphadenopathy:    She has no cervical adenopathy.  Neurological: She is alert.  Skin: Skin is warm and dry. No erythema.  Psychiatric: She has a normal mood and affect. Her speech is normal and behavior is normal. Thought content normal.      No results found for this or any previous visit (from the past 48 hour(s)).  Assessment & Plan  1. Shortness of breath  - DG Chest 2 View;  Future - albuterol  nebulizer solution 2.5 mg - CBC - doxycycline (VIBRA-TABS) 100 MG tablet; Take 1 tablet (100 mg total) by mouth 2 (two) times daily.  Dispense: 20 tablet; Refill: 0  2. Productive cough  - DG Chest 2 View; Future - benzonatate (TESSALON PERLES) 100 MG capsule; Take 1 capsule (100 mg total) by mouth 3 (three) times daily as needed for cough.  Dispense: 30 capsule; Refill: 0 - doxycycline (VIBRA-TABS) 100 MG tablet; Take 1 tablet (100 mg total) by mouth 2 (two) times daily.  Dispense: 20 tablet; Refill: 0  3. Nasal congestion  - fluticasone (FLONASE) 50 MCG/ACT nasal spray; Place 2 sprays into both nostrils daily.  Dispense: 16 g; Refill: 6  4. COPD, severe ([place])  - fluticasone furoate-vilanterol (BREO ELLIPTA) 100-25 MCG/INH AEPB; Inhale 1 puff into the lungs daily.  Dispense: 60 each; Refill: 5  5. Chronic bronchitis, unspecified chronic bronchitis type ([place]) - Ipratropium-Albuterol (COMBIVENT RESPIMAT) 20-100 MCG/ACT AERS respimat; INHALE 1 PUFF BY MOUTH EVERY 6 HOURS. MUST LAST 3 MONTHS  Dispense: 1 Inhaler; Refill: 0  6. Wheezing - albuterol  nebulizer solution 2.5 mg  7. Weight loss Discussed nutritional supplementation; awaiting barium swallow study.  - TSH - CBC - Ambulatory referral to [facility]- order cancelled patient states social work already involved to help her with pureed diet and barium swallow ordered.  - COMPLETE METABOLIC PANEL WITH GFR   -Red flags and when to present for emergency care or RTC including fever >101.37F, chest pain or tightness unrelieved with inhalers, shortness of breath, new/worsening/un-resolving symptoms, reviewed with patient at time of visit. Follow up and care instructions discussed and provided in AVS. ----------------------------------- I have reviewed this encounter including the documentation in this note and/or discussed this patient with the provider, Suezanne Cheshire DNP AGNP-C. I am certifying that I agree  with the content of this note as supervising physician. Enid Derry, [facility] 09/17/2017, 4:24 PM

## 2017-09-08 NOTE — Patient Instructions (Addendum)
-   Please go across the street to get an x-ray today to ensure you do not have pneumonia or any other concerning conditions - Please continue to take your inhalers as prescribed; reordered them to make sure you have plenty - Please start antibiotic therapies take it every 12 hours with food, also recommend starting a daily probiotic - Please take cough medicine every 8 hours for the next few days  - Use flonase to help with nasal congestion - Please drink plenty of fluids and recommend  - Follow up in 2 days to ensure improvement, if having worsening shortness of breath, chest tightness that is not relieved by inhaler, chest pain or new or worsening symptoms please call 911 and get immediate medical attention

## 2017-09-09 LAB — COMPLETE METABOLIC PANEL WITH GFR
AG Ratio: 1.4 (calc) (ref 1.0–2.5)
ALKALINE PHOSPHATASE (APISO): 75 U/L (ref 33–130)
ALT: 17 U/L (ref 6–29)
AST: 22 U/L (ref 10–35)
Albumin: 4.2 g/dL (ref 3.6–5.1)
BUN: 12 mg/dL (ref 7–25)
CALCIUM: 10 mg/dL (ref 8.6–10.4)
CO2: 28 mmol/L (ref 20–32)
CREATININE: 0.7 mg/dL (ref 0.60–0.93)
Chloride: 104 mmol/L (ref 98–110)
GFR, EST NON AFRICAN AMERICAN: 86 mL/min/{1.73_m2} (ref >=60)
GFR, Est African American: 100 mL/min/{1.73_m2} (ref >=60)
GLUCOSE: 98 mg/dL (ref 65–139)
Globulin: 2.9 g/dL (calc) (ref 1.9–3.7)
Potassium: 4.1 mmol/L (ref 3.5–5.3)
Sodium: 143 mmol/L (ref 135–146)
Total Bilirubin: 0.7 mg/dL (ref 0.2–1.2)
Total Protein: 7.1 g/dL (ref 6.1–8.1)

## 2017-09-09 LAB — TSH: TSH: 1.74 mIU/L (ref 0.40–4.50)

## 2017-09-09 LAB — CBC
HEMATOCRIT: 42.9 % (ref 35.0–45.0)
Hemoglobin: 14.6 g/dL (ref 11.7–15.5)
MCH: 31.6 pg (ref 27.0–33.0)
MCHC: 34 g/dL (ref 32.0–36.0)
MCV: 92.9 fL (ref 80.0–100.0)
MPV: 9.6 fL (ref 7.5–12.5)
PLATELETS: 260 10*3/uL (ref 140–400)
RBC: 4.62 10*6/uL (ref 3.80–5.10)
RDW: 13.4 % (ref 11.0–15.0)
WBC: 9.4 10*3/uL (ref 3.8–10.8)

## 2017-09-11 DIAGNOSIS — M797 Fibromyalgia: Secondary | ICD-10-CM | POA: Diagnosis not present

## 2017-09-11 DIAGNOSIS — G894 Chronic pain syndrome: Secondary | ICD-10-CM | POA: Diagnosis not present

## 2017-09-11 DIAGNOSIS — M545 Low back pain: Secondary | ICD-10-CM | POA: Diagnosis not present

## 2017-09-11 DIAGNOSIS — M542 Cervicalgia: Secondary | ICD-10-CM | POA: Diagnosis not present

## 2017-09-11 DIAGNOSIS — G8929 Other chronic pain: Secondary | ICD-10-CM | POA: Diagnosis not present

## 2017-09-15 ENCOUNTER — Telehealth: Payer: Self-pay | Admitting: Nurse Practitioner

## 2017-09-15 NOTE — Telephone Encounter (Signed)
Patient was seen on 09/08/2017 and recommended 2 day follow-up do to increased wheezing and 6 week follow-up with PCP for routine check-up.  Crystal- Will you call and see how her shortness of breath and nasal congestion has improved? If she is not feeling better lets get her in today and still work on getting her in with PCP within 5-6 weeks.

## 2017-09-15 NOTE — Telephone Encounter (Signed)
Patient states she is feeling a little better. She can not come in today because she has an appointment. She will call back and schedule an appointment if she feel she needs to this week with you. She has an appointment scheduled to see PCP on Sept. 23 @ 3:20.

## 2017-09-16 ENCOUNTER — Other Ambulatory Visit: Payer: Self-pay | Admitting: Nurse Practitioner

## 2017-09-16 DIAGNOSIS — R05 Cough: Secondary | ICD-10-CM

## 2017-09-16 DIAGNOSIS — R058 Other specified cough: Secondary | ICD-10-CM

## 2017-09-17 ENCOUNTER — Ambulatory Visit
Admission: RE | Admit: 2017-09-17 | Discharge: 2017-09-17 | Disposition: A | Discharge status: Home / Self Care | Payer: Medicare HMO | Source: Ambulatory Visit | Attending: Nurse Practitioner | Admitting: Nurse Practitioner

## 2017-09-17 ENCOUNTER — Other Ambulatory Visit: Payer: Self-pay | Admitting: Nurse Practitioner

## 2017-09-17 ENCOUNTER — Encounter: Payer: Self-pay | Admitting: Nurse Practitioner

## 2017-09-17 DIAGNOSIS — R918 Other nonspecific abnormal finding of lung field: Secondary | ICD-10-CM | POA: Insufficient documentation

## 2017-09-17 DIAGNOSIS — I700 Atherosclerosis of aorta: Secondary | ICD-10-CM | POA: Diagnosis not present

## 2017-09-17 DIAGNOSIS — J439 Emphysema, unspecified: Secondary | ICD-10-CM | POA: Diagnosis not present

## 2017-09-17 DIAGNOSIS — R911 Solitary pulmonary nodule: Secondary | ICD-10-CM

## 2017-09-18 ENCOUNTER — Telehealth: Payer: Self-pay

## 2017-09-18 NOTE — Telephone Encounter (Signed)
Copied from [place] [id number]. Topic: Inquiry >> Sep 18, 2017  8:17 AM Nils Flack wrote: Reason for CRM: CT CHEST WO CONTRAST was ordered on 08/12 and 08/21.  She had test done on 09/17/17 ( the one that was ordered 09/08/17) Does she need to have another of the same test done?  I will be glad to do prior auth, but wanted to clarify    She will need another one done in 6 months to recheck findings.

## 2017-10-01 DIAGNOSIS — R69 Illness, unspecified: Secondary | ICD-10-CM | POA: Diagnosis not present

## 2017-10-07 ENCOUNTER — Ambulatory Visit: Payer: Medicare HMO | Admitting: Student in an Organized Health Care Education/Training Program

## 2017-10-10 ENCOUNTER — Other Ambulatory Visit: Payer: Self-pay | Admitting: Family Medicine

## 2017-10-10 DIAGNOSIS — I69359 Hemiplegia and hemiparesis following cerebral infarction affecting unspecified side: Secondary | ICD-10-CM

## 2017-10-10 DIAGNOSIS — E785 Hyperlipidemia, unspecified: Secondary | ICD-10-CM

## 2017-10-10 NOTE — Telephone Encounter (Signed)
Refill Request for Cholesterol medication. Atorvastatin to [facility].   Last visit: 09/08/2017   Lab Results  Component Value Date   CHOL 131 06/26/2017   HDL 57 06/26/2017   LDLCALC 61 06/26/2017   TRIG 46 06/26/2017   CHOLHDL 2.3 06/26/2017    Follow up on 10/20/2017

## 2017-10-20 ENCOUNTER — Ambulatory Visit: Payer: Self-pay | Admitting: Family Medicine

## 2017-10-29 DIAGNOSIS — R69 Illness, unspecified: Secondary | ICD-10-CM | POA: Diagnosis not present

## 2017-11-05 DIAGNOSIS — R69 Illness, unspecified: Secondary | ICD-10-CM | POA: Diagnosis not present

## 2018-01-07 DIAGNOSIS — R69 Illness, unspecified: Secondary | ICD-10-CM | POA: Diagnosis not present

## 2018-01-27 ENCOUNTER — Telehealth: Payer: Self-pay | Admitting: Student in an Organized Health Care Education/Training Program

## 2018-01-27 NOTE — Telephone Encounter (Signed)
Patient called stating she needs refill on gabapentin called to [facility] in [place]. I did let her know Dr. Holley Raring is out until next week. Says her bottle has no refills on it.

## 2018-01-30 NOTE — Telephone Encounter (Signed)
You are correct. She needs a return visit for medication mgmt.

## 2018-01-30 NOTE — Telephone Encounter (Signed)
Tried to call patient to set up med mgmt appt w/ Dr. Holley Raring. Vmail not set up, unable to leave a msg. If patient calls for medications she needs appt w/ Dr Holley Raring.

## 2018-02-11 DIAGNOSIS — R69 Illness, unspecified: Secondary | ICD-10-CM | POA: Diagnosis not present

## 2018-02-12 ENCOUNTER — Encounter: Payer: Self-pay | Admitting: Student in an Organized Health Care Education/Training Program

## 2018-02-12 ENCOUNTER — Other Ambulatory Visit: Payer: Self-pay

## 2018-02-12 ENCOUNTER — Ambulatory Visit
Payer: Medicare HMO | Attending: Student in an Organized Health Care Education/Training Program | Admitting: Student in an Organized Health Care Education/Training Program

## 2018-02-12 VITALS — BP 111/73 | HR 83 | Temp 98.3°F | Resp 16 | Ht 65.0 in | Wt 129.2 lb

## 2018-02-12 DIAGNOSIS — M545 Low back pain, unspecified: Secondary | ICD-10-CM

## 2018-02-12 DIAGNOSIS — M5416 Radiculopathy, lumbar region: Secondary | ICD-10-CM | POA: Diagnosis not present

## 2018-02-12 DIAGNOSIS — G8929 Other chronic pain: Secondary | ICD-10-CM | POA: Insufficient documentation

## 2018-02-12 DIAGNOSIS — G894 Chronic pain syndrome: Secondary | ICD-10-CM

## 2018-02-12 DIAGNOSIS — M47816 Spondylosis without myelopathy or radiculopathy, lumbar region: Secondary | ICD-10-CM | POA: Diagnosis not present

## 2018-02-12 MED ORDER — DULOXETINE HCL 20 MG PO CPEP: 40.0000 mg | ORAL_CAPSULE | Freq: Every day | ORAL | 3 refills | Status: DC

## 2018-02-12 MED ORDER — GABAPENTIN 300 MG PO CAPS: 600.0000 mg | ORAL_CAPSULE | Freq: Three times a day (TID) | ORAL | 3 refills | Status: DC

## 2018-02-12 NOTE — Progress Notes (Signed)
Safety precautions to be maintained throughout the outpatient stay will include: orient to surroundings, keep bed in low position, maintain call bell within reach at all times, provide assistance with transfer out of bed and ambulation.  

## 2018-02-12 NOTE — Progress Notes (Signed)
Patient's Name: Beth [name]  MRN: [id number]  Referring Provider: Steele Sizer, MD  DOB: [birth year]  PCP: Steele Sizer, MD  DOS: 02/12/2018  Note by: [name], MD  Service setting: Ambulatory outpatient  Specialty: Interventional Pain Management  Location: [facility] (AMB) Pain Management Facility    Patient type: Established   Primary Reason(s) for Visit: Encounter for post-procedure evaluation of chronic illness with mild to moderate exacerbation CC: Hip Pain (right is worse)  HPI  Beth [name] is a 73 y.o. year old, female patient, who comes today for a post-procedure evaluation. She has Chronic LBP; Colon polyp; COPD, severe ([place]); CVA, old, hemiparesis ([place]); Dyslipidemia; Dysfunction of eustachian tube; Fibromyalgia syndrome; Gastro-esophageal reflux disease without esophagitis; Benign migrating glossitis; Cerebrovascular accident, old; H/O: pneumonia; Benign hypertension; IBS (irritable bowel syndrome); Low back pain with radiation; Chronic recurrent major depressive disorder ([place]); Dysmetabolic syndrome; Benign neoplasm of skin of trunk; OP (osteoporosis); Vitamin D deficiency; Marital problems; Migraine without aura and without status migrainosus, not intractable; Seasonal allergic rhinitis; Hyperglycemia; Senile purpura ([place]); Perennial allergic rhinitis with seasonal variation; Severe episode of recurrent major depressive disorder, without psychotic features ([place]); Moderate benzodiazepine use disorder ([place]); Major depressive disorder, recurrent, severe w/o psychotic behavior ([place]); Generalized anxiety disorder; Chronic neck and back pain; Protein-calorie malnutrition, mild ([place]); Urinary tract infection; Atherosclerosis of aorta ([place]); Lung nodule; Lumbar radiculopathy; Lumbar spondylosis; and Lumbar facet arthropathy on their problem list. Her primarily concern today is the Hip Pain (right is worse)  Pain Assessment: Location: Right, Left(right is worse) Hip Radiating: down the  backs of both legs to feet to some toes Onset: More than a month ago Duration: Chronic pain Quality: Burning, Constant Severity: 8 /10 (subjective, self-reported pain score)  Note: Reported level is inconsistent with clinical observations.                         When using our objective Pain Scale, levels between 6 and 10/10 are said to belong in an emergency room, as it progressively worsens from a 6/10, described as severely limiting, requiring emergency care not usually available at an outpatient pain management facility. At a 6/10 level, communication becomes difficult and requires great effort. Assistance to reach the emergency department may be required. Facial flushing and profuse sweating along with potentially dangerous increases in heart rate and blood pressure will be evident. Effect on ADL: limits daily activities Timing: Constant Modifying factors: nothing helps BP: 111/73  HR: 83  Beth [name] comes in today for post-procedure evaluation.  Further details on both, my assessment(s), as well as the proposed treatment plan, please see below.  Post-Procedure Assessment  01/27/2018 Procedure: Bilateral L3, L4, L5 facet medial branch nerve block Pre-procedure pain score:  7/10 Post-procedure pain score: 0/10         Influential Factors: BMI: 21.50 kg/m Intra-procedural challenges: None observed.         Assessment challenges: None detected.              Reported side-effects: None.        Post-procedural adverse reactions or complications: None reported         Sedation: Please see nurses note. When no sedatives are used, the analgesic levels obtained are directly associated to the effectiveness of the local anesthetics. However, when sedation is provided, the level of analgesia obtained during the initial 1 hour following the intervention, is believed to be the result of a combination of factors. These factors may include,  but are not limited to: 1. The effectiveness of the  local anesthetics used. 2. The effects of the analgesic(s) and/or anxiolytic(s) used. 3. The degree of discomfort experienced by the patient at the time of the procedure. 4. The patients ability and reliability in recalling and recording the events. 5. The presence and influence of possible secondary gains and/or psychosocial factors. Reported result: Relief experienced during the 1st hour after the procedure: 100 % (Ultra-Short Term Relief)            Interpretative annotation: Clinically appropriate result. Analgesia during this period is likely to be Local Anesthetic and/or IV Sedative (Analgesic/Anxiolytic) related.          Effects of local anesthetic: The analgesic effects attained during this period are directly associated to the localized infiltration of local anesthetics and therefore cary significant diagnostic value as to the etiological location, or anatomical origin, of the pain. Expected duration of relief is directly dependent on the pharmacodynamics of the local anesthetic used. Long-acting (4-6 hours) anesthetics used.  Reported result: Relief during the next 4 to 6 hour after the procedure: 100 % (Short-Term Relief)            Interpretative annotation: Clinically appropriate result. Analgesia during this period is likely to be Local Anesthetic-related.          Long-term benefit: Defined as the period of time past the expected duration of local anesthetics (1 hour for short-acting and 4-6 hours for long-acting). With the possible exception of prolonged sympathetic blockade from the local anesthetics, benefits during this period are typically attributed to, or associated with, other factors such as analgesic sensory neuropraxia, antiinflammatory effects, or beneficial biochemical changes provided by agents other than the local anesthetics.  Reported result: Extended relief following procedure: 100 % (Long-Term Relief)            Interpretative annotation: Clinically possible results.  Good relief. No permanent benefit expected. Inflammation plays a part in the etiology to the pain.          Current benefits: Defined as reported results that persistent at this point in time.   Analgesia: >75 %            Function: Somewhat improved ROM: Somewhat improved Interpretative annotation: Ongoing benefit. No permanent benefit expected. Effective diagnostic intervention.          Interpretation: Results would suggest a successful diagnostic and therapeutic intervention.                  Plan:  Please see "Plan of Care" for details.                Laboratory Chemistry  Inflammation Markers (CRP: Acute Phase) (ESR: Chronic Phase) Lab Results  Component Value Date   CRP 0.3 03/21/2016   ESRSEDRATE 1 03/21/2016   LATICACIDVEN 1.3 12/23/2015                         Rheumatology Markers No results found for: RF, ANA, LABURIC, URICUR, LYMEIGGIGMAB, LYMEABIGMQN, HLAB27                      Renal Function Markers Lab Results  Component Value Date   BUN 12 09/08/2017   CREATININE 0.70 37/62/8315   BCR NOT APPLICABLE 17/61/6073   GFRAA 100 09/08/2017   GFRNONAA 86 09/08/2017  Hepatic Function Markers Lab Results  Component Value Date   AST 22 09/08/2017   ALT 17 09/08/2017   ALBUMIN 4.0 07/15/2017   ALKPHOS 81 07/15/2017   LIPASE 27 02/13/2016                        Electrolytes Lab Results  Component Value Date   NA 143 09/08/2017   K 4.1 09/08/2017   CL 104 09/08/2017   CALCIUM 10.0 09/08/2017   MG 1.8 12/25/2012                        Neuropathy Markers Lab Results  Component Value Date   HGBA1C 5.3 06/26/2017                        CNS Tests No results found for: COLORCSF, APPEARCSF, RBCCOUNTCSF, WBCCSF, POLYSCSF, LYMPHSCSF, EOSCSF, PROTEINCSF, GLUCCSF, JCVIRUS, CSFOLI, IGGCSF                      Bone Pathology Markers Lab Results  Component Value Date   VD25OH 37 03/21/2016                         Coagulation  Parameters Lab Results  Component Value Date   INR 0.96 07/15/2017   LABPROT 12.7 07/15/2017   APTT 27 07/15/2017   PLT 260 09/08/2017                        Cardiovascular Markers Lab Results  Component Value Date   CKTOTAL 37 (L) 12/05/2016   TROPONINI <0.03 07/15/2017   HGB 14.6 09/08/2017   HCT 42.9 09/08/2017                         CA Markers No results found for: CEA, CA125, LABCA2                      Note: Lab results reviewed.  Recent Diagnostic Imaging Results  CT Chest Wo Contrast CLINICAL DATA:  Followup chest x-ray with persistent cough and congestion for several weeks  EXAM: CT CHEST WITHOUT CONTRAST  TECHNIQUE: Multidetector CT imaging of the chest was performed following the standard protocol without IV contrast.  COMPARISON:  Chest x-ray from 09/08/2017, chest CT from 09/25/2012  FINDINGS: Cardiovascular: Somewhat limited due to lack of IV contrast. Aortic calcifications are identified as are coronary calcifications. No aneurysmal dilatation is seen. No cardiac enlargement is noted.  Mediastinum/Nodes: Thoracic inlet is within normal limits. No sizable hilar or mediastinal adenopathy is identified. The esophagus is within normal limits.  Lungs/Pleura: Lungs are well aerated bilaterally with diffuse emphysematous changes seen. There is a small nodular density identified in the posterior aspect of the right upper lobe measuring approximately 4 mm. This is stable from the prior CT examination from 2014 and consistent with a benign etiology. There is a somewhat spiculated nodular density measuring approximately 5.5 cm in the right lower lobe best seen on image number 68 of series 3. This area was obscured on prior imaging due to significant underlying infiltrate. This nodular density may represent some residual scarring from that infiltrate. No other nodular changes are seen. No sizable effusion or pneumothorax is noted.  Upper Abdomen:  Visualized upper abdomen is within normal limits.  Musculoskeletal: Postsurgical changes are noted in  the cervical spine. Mild degenerative changes of the thoracic spine are noted. No acute bony abnormality is seen. Chronic T12 compression deformity is noted stable from previous exams.  IMPRESSION: Somewhat spiculated appearing nodule in the right lower lobe as described. This may represent scarring from prior infiltrate in this region although Non-contrast chest CT at 6-12 months is recommended. If the nodule is stable at time of repeat CT, then future CT at 18-24 months (from today's scan) is considered optional for low-risk patients, but is recommended for high-risk patients. This recommendation follows the consensus statement: Guidelines for Management of Incidental Pulmonary Nodules Detected on CT Images: From the Fleischner Society 2017; Radiology 2017; 284:228-243.  Right upper lobe nodule which is stable from the prior exam from 2014 and consistent with a benign etiology.  Aortic Atherosclerosis (ICD10-I70.0) and Emphysema (ICD10-J43.9).  Electronically Signed   By: Inez Catalina M.D.   On: 09/17/2017 15:32  Complexity Note: Imaging results reviewed. Results shared with Beth [name], using [employer].                         Meds   Current Outpatient Medications:  .  acetaminophen (TYLENOL) 325 MG tablet, Take 650 mg by mouth every 4 (four) hours as needed., Disp: , Rfl:  .  ARIPiprazole (ABILIFY) 10 MG tablet, Take 10 mg by mouth at bedtime., Disp: , Rfl: 0 .  aspirin 81 MG chewable tablet, Chew 81 mg daily by mouth., Disp: , Rfl:  .  atorvastatin (LIPITOR) 40 MG tablet, TAKE 1 TABLET BY MOUTH ONCE DAILY, Disp: 90 tablet, Rfl: 1 .  azelastine (ASTELIN) 0.1 % nasal spray, Place 1 spray into the nose daily., Disp: , Rfl:  .  DULoxetine (CYMBALTA) 20 MG capsule, Take 2 capsules (40 mg total) by mouth daily., Disp: 60 capsule, Rfl: 3 .  fluticasone (FLONASE) 50 MCG/ACT  nasal spray, Place 2 sprays into both nostrils daily., Disp: 16 g, Rfl: 6 .  fluticasone furoate-vilanterol (BREO ELLIPTA) 100-25 MCG/INH AEPB, Inhale 1 puff into the lungs daily., Disp: 60 each, Rfl: 5 .  gabapentin (NEURONTIN) 300 MG capsule, Take 2 capsules (600 mg total) by mouth 3 (three) times daily., Disp: 180 capsule, Rfl: 3 .  Ipratropium-Albuterol (COMBIVENT RESPIMAT) 20-100 MCG/ACT AERS respimat, INHALE 1 PUFF BY MOUTH EVERY 6 HOURS. MUST LAST 3 MONTHS, Disp: 1 Inhaler, Rfl: 0 .  metoprolol succinate (TOPROL-XL) 25 MG 24 hr tablet, Take 12.5 mg by mouth daily. , Disp: , Rfl: 1 .  predniSONE (DELTASONE) 20 MG tablet, Take 1 tablet (20 mg total) by mouth daily with breakfast., Disp: 5 tablet, Rfl: 0 .  chlorhexidine (PERIDEX) 0.12 % solution, 30 mLs by Mouth Rinse route as needed., Disp: , Rfl: 1 .  sertraline (ZOLOFT) 50 MG tablet, Take 50 mg by mouth every morning., Disp: , Rfl: 0  Current Facility-Administered Medications:  .  albuterol (PROVENTIL) (2.5 MG/3ML) 0.083% nebulizer solution 2.5 mg, 2.5 mg, Nebulization, Once, Poulose, Elizabeth E, NP  ROS  Constitutional: Denies any fever or chills Gastrointestinal: No reported hemesis, hematochezia, vomiting, or acute GI distress Musculoskeletal: Denies any acute onset joint swelling, redness, loss of ROM, or weakness Neurological: No reported episodes of acute onset apraxia, aphasia, dysarthria, agnosia, amnesia, paralysis, loss of coordination, or loss of consciousness  Allergies  Beth [name] is allergic to bextra  [valdecoxib]; compazine  [prochlorperazine edisylate]; lithium carbonate; and lyrica [pregabalin].  [place]  Drug: Beth [name]  reports no history of drug  use. Alcohol:  reports no history of alcohol use. Tobacco:  reports that she has been smoking cigarettes. She has a 26.25 pack-year smoking history. She has never used smokeless tobacco. Medical:  has a past medical history of Allergy, Anxiety, Asthma, Chronic pain  syndrome, COPD (chronic obstructive pulmonary disease) ([place]), CVA (cerebral infarction), Depression, Fibromyalgia, Headache, Hyperlipidemia, Hypertension, IBS (irritable bowel syndrome), Stroke ([place]), and Vitamin D deficiency. Surgical: Beth [name]  has a past surgical history that includes Breast surgery (2011); Cervical discectomy; Appendectomy; Abdominal hysterectomy; Cholecystectomy; and Sinusotomy. Family: family history includes Alcohol abuse in her father; Anxiety disorder in her mother; Cancer in her father; Depression in her father and mother; Gallbladder disease in her father.  Constitutional Exam  General appearance: Well nourished, well developed, and well hydrated. In no apparent acute distress Vitals:   02/12/18 1007  BP: 111/73  Pulse: 83  Resp: 16  Temp: 98.3 F (36.8 C)  TempSrc: Oral  SpO2: 95%  Weight: 129 lb 3.2 oz (58.6 kg)  Height: '5\' 5"'  (1.651 m)   BMI Assessment: Estimated body mass index is 21.5 kg/m as calculated from the following:   Height as of this encounter: '5\' 5"'  (1.651 m).   Weight as of this encounter: 129 lb 3.2 oz (58.6 kg).  BMI interpretation table: BMI level Category Range association with higher incidence of chronic pain  <18 kg/m2 Underweight   18.5-24.9 kg/m2 Ideal body weight   25-29.9 kg/m2 Overweight Increased incidence by 20%  30-34.9 kg/m2 Obese (Class I) Increased incidence by 68%  35-39.9 kg/m2 Severe obesity (Class II) Increased incidence by 136%  >40 kg/m2 Extreme obesity (Class III) Increased incidence by 254%   Patient's current BMI Ideal Body weight  Body mass index is 21.5 kg/m. Ideal body weight: 57 kg (125 lb 10.6 oz) Adjusted ideal body weight: 57.6 kg (127 lb 1.2 oz)   BMI Readings from Last 4 Encounters:  02/12/18 21.50 kg/m  09/08/17 18.95 kg/m  09/03/17 19.80 kg/m  07/28/17 20.77 kg/m   Wt Readings from Last 4 Encounters:  02/12/18 129 lb 3.2 oz (58.6 kg)  09/08/17 113 lb 14.4 oz (51.7 kg)  09/03/17 119  lb (54 kg)  07/28/17 124 lb 12.8 oz (56.6 kg)  Psych/Mental status: Alert, oriented x 3 (person, place, & time)       Eyes: PERLA Respiratory: No evidence of acute respiratory distress  Cervical Spine Area Exam  Skin & Axial Inspection: No masses, redness, edema, swelling, or associated skin lesions Alignment: Symmetrical Functional ROM: Unrestricted ROM      Stability: No instability detected Muscle Tone/Strength: Functionally intact. No obvious neuro-muscular anomalies detected. Sensory (Neurological): Unimpaired Palpation: No palpable anomalies              Upper Extremity (UE) Exam    Side: Right upper extremity  Side: Left upper extremity  Skin & Extremity Inspection: Skin color, temperature, and hair growth are WNL. No peripheral edema or cyanosis. No masses, redness, swelling, asymmetry, or associated skin lesions. No contractures.  Skin & Extremity Inspection: Skin color, temperature, and hair growth are WNL. No peripheral edema or cyanosis. No masses, redness, swelling, asymmetry, or associated skin lesions. No contractures.  Functional ROM: Unrestricted ROM          Functional ROM: Unrestricted ROM          Muscle Tone/Strength: Functionally intact. No obvious neuro-muscular anomalies detected.  Muscle Tone/Strength: Functionally intact. No obvious neuro-muscular anomalies detected.  Sensory (Neurological): Unimpaired  Sensory (Neurological): Unimpaired          Palpation: No palpable anomalies              Palpation: No palpable anomalies              Provocative Test(s):  Phalen's test: deferred Tinel's test: deferred Apley's scratch test (touch opposite shoulder):  Action 1 (Across chest): deferred Action 2 (Overhead): deferred Action 3 (LB reach): deferred   Provocative Test(s):  Phalen's test: deferred Tinel's test: deferred Apley's scratch test (touch opposite shoulder):  Action 1 (Across chest): deferred Action 2 (Overhead): deferred Action 3 (LB reach):  deferred    Thoracic Spine Area Exam  Skin & Axial Inspection: No masses, redness, or swelling Alignment: Symmetrical Functional ROM: Unrestricted ROM Stability: No instability detected Muscle Tone/Strength: Functionally intact. No obvious neuro-muscular anomalies detected. Sensory (Neurological): Unimpaired Muscle strength & Tone: No palpable anomalies  Lumbar Spine Area Exam  Skin & Axial Inspection: No masses, redness, or swelling Alignment: Symmetrical Functional ROM: Improved after treatment       Stability: No instability detected Muscle Tone/Strength: Functionally intact. No obvious neuro-muscular anomalies detected. Sensory (Neurological): Dermatomal pain pattern Palpation: No palpable anomalies       Provocative Tests: Hyperextension/rotation test: Improved after treatment       Lumbar quadrant test (Kemp's test): deferred today       Lateral bending test: (+) ipsilateral radicular pain, bilaterally. Positive for bilateral foraminal stenosis. Patrick's Maneuver: deferred today                   FABER* test: deferred today                   S-I anterior distraction/compression test: deferred today         S-I lateral compression test: deferred today         S-I Thigh-thrust test: deferred today         S-I Gaenslen's test: deferred today         *(Flexion, ABduction and External Rotation)  Gait & Posture Assessment  Ambulation: Unassisted Gait: Relatively normal for age and body habitus Posture: WNL   Lower Extremity Exam    Side: Right lower extremity  Side: Left lower extremity  Stability: No instability observed          Stability: No instability observed          Skin & Extremity Inspection: Skin color, temperature, and hair growth are WNL. No peripheral edema or cyanosis. No masses, redness, swelling, asymmetry, or associated skin lesions. No contractures.  Skin & Extremity Inspection: Skin color, temperature, and hair growth are WNL. No peripheral edema or  cyanosis. No masses, redness, swelling, asymmetry, or associated skin lesions. No contractures.  Functional ROM: Decreased ROM for hip joint          Functional ROM: Decreased ROM for knee joint          Muscle Tone/Strength: Functionally intact. No obvious neuro-muscular anomalies detected.  Muscle Tone/Strength: Functionally intact. No obvious neuro-muscular anomalies detected.  Sensory (Neurological): Dermatomal pain pattern        Sensory (Neurological): Dermatomal pain pattern        DTR: Patellar: 1+: trace Achilles: deferred today Plantar: deferred today  DTR: Patellar: 1+: trace Achilles: deferred today Plantar: deferred today  Palpation: No palpable anomalies  Palpation: No palpable anomalies   Assessment  Primary Diagnosis & Pertinent Problem List: The primary encounter diagnosis was Lumbar radiculopathy. Diagnoses of  Lumbar spondylosis, Lumbar facet arthropathy, Chronic pain syndrome, and Chronic bilateral low back pain without sciatica were also pertinent to this visit.  Status Diagnosis  Having a Flare-up Improved Improved 1. Lumbar radiculopathy   2. Lumbar spondylosis   3. Lumbar facet arthropathy   4. Chronic pain syndrome   5. Chronic bilateral low back pain without sciatica     Problems updated and reviewed during this visit: Problem  Lumbar Radiculopathy  Lumbar Spondylosis  Lumbar Facet Arthropathy   Patient follows up status post bilateral L3, L4, L5 facet medial branch nerve block which the patient states were very effective for her axial low back pain.  She states that the pain in her lower lumbar and buttock region has improved.  Patient is now endorsing heaviness and burning and tingling that is radiating down both of her legs in a dermatomal fashion likely affecting the L5 and S1 dermatome.  We discussed lumbar epidural steroid injection the symptoms.  Risks and benefits were reviewed and patient would like to proceed.  Plan: -Lumbar ESI for symptoms of  lumbar radiculopathy -Refill of Cymbalta and gabapentin as below  Future considerations: Repeat lumbar facet medial branch nerve block #2 at L3, L4, L5; lumbar radiofrequency ablation  Plan of Care  Pharmacotherapy (Medications Ordered): Meds ordered this encounter  Medications  . DULoxetine (CYMBALTA) 20 MG capsule    Sig: Take 2 capsules (40 mg total) by mouth daily.    Dispense:  60 capsule    Refill:  3  . gabapentin (NEURONTIN) 300 MG capsule    Sig: Take 2 capsules (600 mg total) by mouth 3 (three) times daily.    Dispense:  180 capsule    Refill:  3   Lab-work, procedure(s), and/or referral(s): Orders Placed This Encounter  Procedures  . Lumbar Epidural Injection     Provider-requested follow-up: Return in about 2 weeks (around 02/26/2018) for Procedure.  No future appointments.  Primary Care Physician: Steele Sizer, MD Location: [facility] Note by: [name], M.D Date: 02/12/2018; Time: 11:49 AM  Patient Instructions   GENERAL RISKS AND COMPLICATIONS  What are the risk, side effects and possible complications? Generally speaking, most procedures are safe.  However, with any procedure there are risks, side effects, and the possibility of complications.  The risks and complications are dependent upon the sites that are lesioned, or the type of nerve block to be performed.  The closer the procedure is to the spine, the more serious the risks are.  Great care is taken when placing the radio frequency needles, block needles or lesioning probes, but sometimes complications can occur. 1. Infection: Any time there is an injection through the skin, there is a risk of infection.  This is why sterile conditions are used for these blocks.  There are four possible types of infection. 1. Localized skin infection. 2. Central Nervous System Infection-This can be in the form of Meningitis, which can be deadly. 3. Epidural Infections-This can be in  the form of an epidural abscess, which can cause pressure inside of the spine, causing compression of the spinal cord with subsequent paralysis. This would require an emergency surgery to decompress, and there are no guarantees that the patient would recover from the paralysis. 4. Discitis-This is an infection of the intervertebral discs.  It occurs in about 1% of discography procedures.  It is difficult to treat and it may lead to surgery.        2. Pain: the needles  have to go through skin and soft tissues, will cause soreness.       3. Damage to internal structures:  The nerves to be lesioned may be near blood vessels or    other nerves which can be potentially damaged.       4. Bleeding: Bleeding is more common if the patient is taking blood thinners such as  aspirin, Coumadin, Ticiid, Plavix, etc., or if he/she have some genetic predisposition  such as hemophilia. Bleeding into the spinal canal can cause compression of the spinal  cord with subsequent paralysis.  This would require an emergency surgery to  decompress and there are no guarantees that the patient would recover from the  paralysis.       5. Pneumothorax:  Puncturing of a lung is a possibility, every time a needle is introduced in  the area of the chest or upper back.  Pneumothorax refers to free air around the  collapsed lung(s), inside of the thoracic cavity (chest cavity).  Another two possible  complications related to a similar event would include: Hemothorax and Chylothorax.   These are variations of the Pneumothorax, where instead of air around the collapsed  lung(s), you may have blood or chyle, respectively.       6. Spinal headaches: They may occur with any procedures in the area of the spine.       7. Persistent CSF (Cerebro-Spinal Fluid) leakage: This is a rare problem, but may occur  with prolonged intrathecal or epidural catheters either due to the formation of a fistulous  track or a dural tear.       8. Nerve damage: By  working so close to the spinal cord, there is always a possibility of  nerve damage, which could be as serious as a permanent spinal cord injury with  paralysis.       9. Death:  Although rare, severe deadly allergic reactions known as "Anaphylactic  reaction" can occur to any of the medications used.      10. Worsening of the symptoms:  We can always make thing worse.  What are the chances of something like this happening? Chances of any of this occuring are extremely low.  By statistics, you have more of a chance of getting killed in a motor vehicle accident: while driving to the hospital than any of the above occurring .  Nevertheless, you should be aware that they are possibilities.  In general, it is similar to taking a shower.  Everybody knows that you can slip, hit your head and get killed.  Does that mean that you should not shower again?  Nevertheless always keep in mind that statistics do not mean anything if you happen to be on the wrong side of them.  Even if a procedure has a 1 (one) in a 1,000,000 (million) chance of going wrong, it you happen to be that one..Also, keep in mind that by statistics, you have more of a chance of having something go wrong when taking medications.  Who should not have this procedure? If you are on a blood thinning medication (e.g. Coumadin, Plavix, see list of "Blood Thinners"), or if you have an active infection going on, you should not have the procedure.  If you are taking any blood thinners, please inform your physician.  How should I prepare for this procedure?  Do not eat or drink anything at least six hours prior to the procedure.  Bring a driver with you .  It  cannot be a taxi.  Come accompanied by an adult that can drive you back, and that is strong enough to help you if your legs get weak or numb from the local anesthetic.  Take all of your medicines the morning of the procedure with just enough water to swallow them.  If you have diabetes,  make sure that you are scheduled to have your procedure done first thing in the morning, whenever possible.  If you have diabetes, take only half of your insulin dose and notify our nurse that you have done so as soon as you arrive at the clinic.  If you are diabetic, but only take blood sugar pills (oral hypoglycemic), then do not take them on the morning of your procedure.  You may take them after you have had the procedure.  Do not take aspirin or any aspirin-containing medications, at least eleven (11) days prior to the procedure.  They may prolong bleeding.  Wear loose fitting clothing that may be easy to take off and that you would not mind if it got stained with Betadine or blood.  Do not wear any jewelry or perfume  Remove any nail coloring.  It will interfere with some of our monitoring equipment.  NOTE: Remember that this is not meant to be interpreted as a complete list of all possible complications.  Unforeseen problems may occur.  BLOOD THINNERS The following drugs contain aspirin or other products, which can cause increased bleeding during surgery and should not be taken for 2 weeks prior to and 1 week after surgery.  If you should need take something for relief of minor pain, you may take acetaminophen which is found in Tylenol,m Datril, Anacin-3 and Panadol. It is not blood thinner. The products listed below are.  Do not take any of the products listed below in addition to any listed on your instruction sheet.  A.P.C or A.P.C with Codeine Codeine Phosphate Capsules #3 Ibuprofen Ridaura  ABC compound Congesprin Imuran rimadil  Advil Cope Indocin Robaxisal  Alka-Seltzer Effervescent Pain Reliever and Antacid Coricidin or Coricidin-D  Indomethacin Rufen  Alka-Seltzer plus Cold Medicine Cosprin Ketoprofen S-A-C Tablets  Anacin Analgesic Tablets or Capsules Coumadin Korlgesic Salflex  Anacin Extra Strength Analgesic tablets or capsules CP-2 Tablets Lanoril Salicylate  Anaprox  Cuprimine Capsules Levenox Salocol  Anexsia-D Dalteparin Magan Salsalate  Anodynos Darvon compound Magnesium Salicylate Sine-off  Ansaid Dasin Capsules Magsal Sodium Salicylate  Anturane Depen Capsules Marnal Soma  APF Arthritis pain formula Dewitt's Pills Measurin Stanback  Argesic Dia-Gesic Meclofenamic Sulfinpyrazone  Arthritis Bayer Timed Release Aspirin Diclofenac Meclomen Sulindac  Arthritis pain formula Anacin Dicumarol Medipren Supac  Analgesic (Safety coated) Arthralgen Diffunasal Mefanamic Suprofen  Arthritis Strength Bufferin Dihydrocodeine Mepro Compound Suprol  Arthropan liquid Dopirydamole Methcarbomol with Aspirin Synalgos  ASA tablets/Enseals Disalcid Micrainin Tagament  Ascriptin Doan's Midol Talwin  Ascriptin A/D Dolene Mobidin Tanderil  Ascriptin Extra Strength Dolobid Moblgesic Ticlid  Ascriptin with Codeine Doloprin or Doloprin with Codeine Momentum Tolectin  Asperbuf Duoprin Mono-gesic Trendar  Aspergum Duradyne Motrin or Motrin IB Triminicin  Aspirin plain, buffered or enteric coated Durasal Myochrisine Trigesic  Aspirin Suppositories Easprin Nalfon Trillsate  Aspirin with Codeine Ecotrin Regular or Extra Strength Naprosyn Uracel  Atromid-S Efficin Naproxen Ursinus  Auranofin Capsules Elmiron Neocylate Vanquish  Axotal Emagrin Norgesic Verin  Azathioprine Empirin or Empirin with Codeine Normiflo Vitamin E  Azolid Emprazil Nuprin Voltaren  Bayer Aspirin plain, buffered or children's or timed BC Tablets or powders Encaprin Orgaran Warfarin Sodium  Buff-a-Comp Enoxaparin Orudis Zorpin  Buff-a-Comp with Codeine Equegesic Os-Cal-Gesic   Buffaprin Excedrin plain, buffered or Extra Strength Oxalid   Bufferin Arthritis Strength Feldene Oxphenbutazone   Bufferin plain or Extra Strength Feldene Capsules Oxycodone with Aspirin   Bufferin with Codeine Fenoprofen Fenoprofen Pabalate or Pabalate-SF   Buffets II Flogesic Panagesic   Buffinol plain or Extra Strength Florinal  or Florinal with Codeine Panwarfarin   Buf-Tabs Flurbiprofen Penicillamine   Butalbital Compound Four-way cold tablets Penicillin   Butazolidin Fragmin Pepto-Bismol   Carbenicillin Geminisyn Percodan   Carna Arthritis Reliever Geopen Persantine   Carprofen Gold's salt Persistin   Chloramphenicol Goody's Phenylbutazone   Chloromycetin Haltrain Piroxlcam   Clmetidine heparin Plaquenil   Cllnoril Hyco-pap Ponstel   Clofibrate Hydroxy chloroquine Propoxyphen         Before stopping any of these medications, be sure to consult the physician who ordered them.  Some, such as Coumadin (Warfarin) are ordered to prevent or treat serious conditions such as "deep thrombosis", "pumonary embolisms", and other heart problems.  The amount of time that you may need off of the medication may also vary with the medication and the reason for which you were taking it.  If you are taking any of these medications, please make sure you notify your pain physician before you undergo any procedures.         Epidural Steroid Injection Patient Information  Description: The epidural space surrounds the nerves as they exit the spinal cord.  In some patients, the nerves can be compressed and inflamed by a bulging disc or a tight spinal canal (spinal stenosis).  By injecting steroids into the epidural space, we can bring irritated nerves into direct contact with a potentially helpful medication.  These steroids act directly on the irritated nerves and can reduce swelling and inflammation which often leads to decreased pain.  Epidural steroids may be injected anywhere along the spine and from the neck to the low back depending upon the location of your pain.   After numbing the skin with local anesthetic (like Novocaine), a small needle is passed into the epidural space slowly.  You may experience a sensation of pressure while this is being done.  The entire block usually last less than 10 minutes.  Conditions which may  be treated by epidural steroids:   Low back and leg pain  Neck and arm pain  Spinal stenosis  Post-laminectomy syndrome  Herpes zoster (shingles) pain  Pain from compression fractures  Preparation for the injection:  1. Do not eat any solid food or dairy products within 8 hours of your appointment.  2. You may drink clear liquids up to 3 hours before appointment.  Clear liquids include water, black coffee, juice or soda.  No milk or cream please. 3. You may take your regular medication, including pain medications, with a sip of water before your appointment  Diabetics should hold regular insulin (if taken separately) and take 1/2 normal NPH dos the morning of the procedure.  Carry some sugar containing items with you to your appointment. 4. A driver must accompany you and be prepared to drive you home after your procedure.  5. Bring all your current medications with your. 6. An IV may be inserted and sedation may be given at the discretion of the physician.   7. A blood pressure cuff, EKG and other monitors will often be applied during the procedure.  Some patients may need to have extra oxygen administered for a short period.  8. You will be asked to provide medical information, including your allergies, prior to the procedure.  We must know immediately if you are taking blood thinners (like Coumadin/Warfarin)  Or if you are allergic to IV iodine contrast (dye). We must know if you could possible be pregnant.  Possible side-effects:  Bleeding from needle site  Infection (rare, may require surgery)  Nerve injury (rare)  Numbness & tingling (temporary)  Difficulty urinating (rare, temporary)  Spinal headache ( a headache worse with upright posture)  Light -headedness (temporary)  Pain at injection site (several days)  Decreased blood pressure (temporary)  Weakness in arm/leg (temporary)  Pressure sensation in back/neck (temporary)  Call if you  experience:  Fever/chills associated with headache or increased back/neck pain.  Headache worsened by an upright position.  New onset weakness or numbness of an extremity below the injection site  Hives or difficulty breathing (go to the emergency room)  Inflammation or drainage at the infection site  Severe back/neck pain  Any new symptoms which are concerning to you  Please note:  Although the local anesthetic injected can often make your back or neck feel good for several hours after the injection, the pain will likely return.  It takes 3-7 days for steroids to work in the epidural space.  You may not notice any pain relief for at least that one week.  If effective, we will often do a series of three injections spaced 3-6 weeks apart to maximally decrease your pain.  After the initial series, we generally will wait several months before considering a repeat injection of the same type.  If you have any questions, please call [phone] [facility]

## 2018-02-12 NOTE — Patient Instructions (Signed)
GENERAL RISKS AND COMPLICATIONS  What are the risk, side effects and possible complications? Generally speaking, most procedures are safe.  However, with any procedure there are risks, side effects, and the possibility of complications.  The risks and complications are dependent upon the sites that are lesioned, or the type of nerve block to be performed.  The closer the procedure is to the spine, the more serious the risks are.  Great care is taken when placing the radio frequency needles, block needles or lesioning probes, but sometimes complications can occur. 1. Infection: Any time there is an injection through the skin, there is a risk of infection.  This is why sterile conditions are used for these blocks.  There are four possible types of infection. 1. Localized skin infection. 2. Central Nervous System Infection-This can be in the form of Meningitis, which can be deadly. 3. Epidural Infections-This can be in the form of an epidural abscess, which can cause pressure inside of the spine, causing compression of the spinal cord with subsequent paralysis. This would require an emergency surgery to decompress, and there are no guarantees that the patient would recover from the paralysis. 4. Discitis-This is an infection of the intervertebral discs.  It occurs in about 1% of discography procedures.  It is difficult to treat and it may lead to surgery.        2. Pain: the needles have to go through skin and soft tissues, will cause soreness.       3. Damage to internal structures:  The nerves to be lesioned may be near blood vessels or    other nerves which can be potentially damaged.       4. Bleeding: Bleeding is more common if the patient is taking blood thinners such as  aspirin, Coumadin, Ticiid, Plavix, etc., or if he/she have some genetic predisposition  such as hemophilia. Bleeding into the spinal canal can cause compression of the spinal  cord with subsequent paralysis.  This would require an  emergency surgery to  decompress and there are no guarantees that the patient would recover from the  paralysis.       5. Pneumothorax:  Puncturing of a lung is a possibility, every time a needle is introduced in  the area of the chest or upper back.  Pneumothorax refers to free air around the  collapsed lung(s), inside of the thoracic cavity (chest cavity).  Another two possible  complications related to a similar event would include: Hemothorax and Chylothorax.   These are variations of the Pneumothorax, where instead of air around the collapsed  lung(s), you may have blood or chyle, respectively.       6. Spinal headaches: They may occur with any procedures in the area of the spine.       7. Persistent CSF (Cerebro-Spinal Fluid) leakage: This is a rare problem, but may occur  with prolonged intrathecal or epidural catheters either due to the formation of a fistulous  track or a dural tear.       8. Nerve damage: By working so close to the spinal cord, there is always a possibility of  nerve damage, which could be as serious as a permanent spinal cord injury with  paralysis.       9. Death:  Although rare, severe deadly allergic reactions known as "Anaphylactic  reaction" can occur to any of the medications used.      10. Worsening of the symptoms:  We can always make thing worse.    What are the chances of something like this happening? Chances of any of this occuring are extremely low.  By statistics, you have more of a chance of getting killed in a motor vehicle accident: while driving to the hospital than any of the above occurring .  Nevertheless, you should be aware that they are possibilities.  In general, it is similar to taking a shower.  Everybody knows that you can slip, hit your head and get killed.  Does that mean that you should not shower again?  Nevertheless always keep in mind that statistics do not mean anything if you happen to be on the wrong side of them.  Even if a procedure has a 1  (one) in a 1,000,000 (million) chance of going wrong, it you happen to be that one..Also, keep in mind that by statistics, you have more of a chance of having something go wrong when taking medications.  Who should not have this procedure? If you are on a blood thinning medication (e.g. Coumadin, Plavix, see list of "Blood Thinners"), or if you have an active infection going on, you should not have the procedure.  If you are taking any blood thinners, please inform your physician.  How should I prepare for this procedure?  Do not eat or drink anything at least six hours prior to the procedure.  Bring a driver with you .  It cannot be a taxi.  Come accompanied by an adult that can drive you back, and that is strong enough to help you if your legs get weak or numb from the local anesthetic.  Take all of your medicines the morning of the procedure with just enough water to swallow them.  If you have diabetes, make sure that you are scheduled to have your procedure done first thing in the morning, whenever possible.  If you have diabetes, take only half of your insulin dose and notify our nurse that you have done so as soon as you arrive at the clinic.  If you are diabetic, but only take blood sugar pills (oral hypoglycemic), then do not take them on the morning of your procedure.  You may take them after you have had the procedure.  Do not take aspirin or any aspirin-containing medications, at least eleven (11) days prior to the procedure.  They may prolong bleeding.  Wear loose fitting clothing that may be easy to take off and that you would not mind if it got stained with Betadine or blood.  Do not wear any jewelry or perfume  Remove any nail coloring.  It will interfere with some of our monitoring equipment.  NOTE: Remember that this is not meant to be interpreted as a complete list of all possible complications.  Unforeseen problems may occur.  BLOOD THINNERS The following drugs  contain aspirin or other products, which can cause increased bleeding during surgery and should not be taken for 2 weeks prior to and 1 week after surgery.  If you should need take something for relief of minor pain, you may take acetaminophen which is found in Tylenol,m Datril, Anacin-3 and Panadol. It is not blood thinner. The products listed below are.  Do not take any of the products listed below in addition to any listed on your instruction sheet.  A.P.C or A.P.C with Codeine Codeine Phosphate Capsules #3 Ibuprofen Ridaura  ABC compound Congesprin Imuran rimadil  Advil Cope Indocin Robaxisal  Alka-Seltzer Effervescent Pain Reliever and Antacid Coricidin or Coricidin-D  Indomethacin Rufen    Alka-Seltzer plus Cold Medicine Cosprin Ketoprofen S-A-C Tablets  Anacin Analgesic Tablets or Capsules Coumadin Korlgesic Salflex  Anacin Extra Strength Analgesic tablets or capsules CP-2 Tablets Lanoril Salicylate  Anaprox Cuprimine Capsules Levenox Salocol  Anexsia-D Dalteparin Magan Salsalate  Anodynos Darvon compound Magnesium Salicylate Sine-off  Ansaid Dasin Capsules Magsal Sodium Salicylate  Anturane Depen Capsules Marnal Soma  APF Arthritis pain formula Dewitt's Pills Measurin Stanback  Argesic Dia-Gesic Meclofenamic Sulfinpyrazone  Arthritis Bayer Timed Release Aspirin Diclofenac Meclomen Sulindac  Arthritis pain formula Anacin Dicumarol Medipren Supac  Analgesic (Safety coated) Arthralgen Diffunasal Mefanamic Suprofen  Arthritis Strength Bufferin Dihydrocodeine Mepro Compound Suprol  Arthropan liquid Dopirydamole Methcarbomol with Aspirin Synalgos  ASA tablets/Enseals Disalcid Micrainin Tagament  Ascriptin Doan's Midol Talwin  Ascriptin A/D Dolene Mobidin Tanderil  Ascriptin Extra Strength Dolobid Moblgesic Ticlid  Ascriptin with Codeine Doloprin or Doloprin with Codeine Momentum Tolectin  Asperbuf Duoprin Mono-gesic Trendar  Aspergum Duradyne Motrin or Motrin IB Triminicin  Aspirin  plain, buffered or enteric coated Durasal Myochrisine Trigesic  Aspirin Suppositories Easprin Nalfon Trillsate  Aspirin with Codeine Ecotrin Regular or Extra Strength Naprosyn Uracel  Atromid-S Efficin Naproxen Ursinus  Auranofin Capsules Elmiron Neocylate Vanquish  Axotal Emagrin Norgesic Verin  Azathioprine Empirin or Empirin with Codeine Normiflo Vitamin E  Azolid Emprazil Nuprin Voltaren  Bayer Aspirin plain, buffered or children's or timed BC Tablets or powders Encaprin Orgaran Warfarin Sodium  Buff-a-Comp Enoxaparin Orudis Zorpin  Buff-a-Comp with Codeine Equegesic Os-Cal-Gesic   Buffaprin Excedrin plain, buffered or Extra Strength Oxalid   Bufferin Arthritis Strength Feldene Oxphenbutazone   Bufferin plain or Extra Strength Feldene Capsules Oxycodone with Aspirin   Bufferin with Codeine Fenoprofen Fenoprofen Pabalate or Pabalate-SF   Buffets II Flogesic Panagesic   Buffinol plain or Extra Strength Florinal or Florinal with Codeine Panwarfarin   Buf-Tabs Flurbiprofen Penicillamine   Butalbital Compound Four-way cold tablets Penicillin   Butazolidin Fragmin Pepto-Bismol   Carbenicillin Geminisyn Percodan   Carna Arthritis Reliever Geopen Persantine   Carprofen Gold's salt Persistin   Chloramphenicol Goody's Phenylbutazone   Chloromycetin Haltrain Piroxlcam   Clmetidine heparin Plaquenil   Cllnoril Hyco-pap Ponstel   Clofibrate Hydroxy chloroquine Propoxyphen         Before stopping any of these medications, be sure to consult the physician who ordered them.  Some, such as Coumadin (Warfarin) are ordered to prevent or treat serious conditions such as "deep thrombosis", "pumonary embolisms", and other heart problems.  The amount of time that you may need off of the medication may also vary with the medication and the reason for which you were taking it.  If you are taking any of these medications, please make sure you notify your pain physician before you undergo any  procedures.         Epidural Steroid Injection Patient Information  Description: The epidural space surrounds the nerves as they exit the spinal cord.  In some patients, the nerves can be compressed and inflamed by a bulging disc or a tight spinal canal (spinal stenosis).  By injecting steroids into the epidural space, we can bring irritated nerves into direct contact with a potentially helpful medication.  These steroids act directly on the irritated nerves and can reduce swelling and inflammation which often leads to decreased pain.  Epidural steroids may be injected anywhere along the spine and from the neck to the low back depending upon the location of your pain.   After numbing the skin with local anesthetic (like Novocaine), a small needle is passed   into the epidural space slowly.  You may experience a sensation of pressure while this is being done.  The entire block usually last less than 10 minutes.  Conditions which may be treated by epidural steroids:   Low back and leg pain  Neck and arm pain  Spinal stenosis  Post-laminectomy syndrome  Herpes zoster (shingles) pain  Pain from compression fractures  Preparation for the injection:  1. Do not eat any solid food or dairy products within 8 hours of your appointment.  2. You may drink clear liquids up to 3 hours before appointment.  Clear liquids include water, black coffee, juice or soda.  No milk or cream please. 3. You may take your regular medication, including pain medications, with a sip of water before your appointment  Diabetics should hold regular insulin (if taken separately) and take 1/2 normal NPH dos the morning of the procedure.  Carry some sugar containing items with you to your appointment. 4. A driver must accompany you and be prepared to drive you home after your procedure.  5. Bring all your current medications with your. 6. An IV may be inserted and sedation may be given at the discretion of the  physician.   7. A blood pressure cuff, EKG and other monitors will often be applied during the procedure.  Some patients may need to have extra oxygen administered for a short period. 8. You will be asked to provide medical information, including your allergies, prior to the procedure.  We must know immediately if you are taking blood thinners (like Coumadin/Warfarin)  Or if you are allergic to IV iodine contrast (dye). We must know if you could possible be pregnant.  Possible side-effects:  Bleeding from needle site  Infection (rare, may require surgery)  Nerve injury (rare)  Numbness & tingling (temporary)  Difficulty urinating (rare, temporary)  Spinal headache ( a headache worse with upright posture)  Light -headedness (temporary)  Pain at injection site (several days)  Decreased blood pressure (temporary)  Weakness in arm/leg (temporary)  Pressure sensation in back/neck (temporary)  Call if you experience:  Fever/chills associated with headache or increased back/neck pain.  Headache worsened by an upright position.  New onset weakness or numbness of an extremity below the injection site  Hives or difficulty breathing (go to the emergency room)  Inflammation or drainage at the infection site  Severe back/neck pain  Any new symptoms which are concerning to you  Please note:  Although the local anesthetic injected can often make your back or neck feel good for several hours after the injection, the pain will likely return.  It takes 3-7 days for steroids to work in the epidural space.  You may not notice any pain relief for at least that one week.  If effective, we will often do a series of three injections spaced 3-6 weeks apart to maximally decrease your pain.  After the initial series, we generally will wait several months before considering a repeat injection of the same type.  If you have any questions, please call [phone] [facility] 

## 2018-03-04 ENCOUNTER — Ambulatory Visit: Payer: Medicare HMO | Admitting: Student in an Organized Health Care Education/Training Program

## 2018-03-09 ENCOUNTER — Encounter: Payer: Self-pay | Admitting: Student in an Organized Health Care Education/Training Program

## 2018-03-09 ENCOUNTER — Other Ambulatory Visit: Payer: Self-pay

## 2018-03-09 ENCOUNTER — Ambulatory Visit
Admission: RE | Admit: 2018-03-09 | Discharge: 2018-03-09 | Disposition: A | Discharge status: Home / Self Care | Payer: Medicare HMO | Source: Ambulatory Visit | Attending: Student in an Organized Health Care Education/Training Program | Admitting: Student in an Organized Health Care Education/Training Program

## 2018-03-09 ENCOUNTER — Ambulatory Visit (HOSPITAL_BASED_OUTPATIENT_CLINIC_OR_DEPARTMENT_OTHER): Payer: Medicare HMO | Admitting: Student in an Organized Health Care Education/Training Program

## 2018-03-09 DIAGNOSIS — M5416 Radiculopathy, lumbar region: Secondary | ICD-10-CM

## 2018-03-09 MED ORDER — DEXAMETHASONE SODIUM PHOSPHATE 10 MG/ML IJ SOLN
10.0000 mg | Freq: Once | INTRAMUSCULAR | Status: AC
  Administered 2018-03-09: 10 mg
  Filled 2018-03-09: qty 1

## 2018-03-09 MED ORDER — ROPIVACAINE HCL 2 MG/ML IJ SOLN
2.0000 mL | Freq: Once | INTRAMUSCULAR | Status: AC
  Administered 2018-03-09: 10 mL via EPIDURAL
  Filled 2018-03-09: qty 10

## 2018-03-09 MED ORDER — GABAPENTIN 300 MG PO CAPS: 600.0000 mg | ORAL_CAPSULE | Freq: Three times a day (TID) | ORAL | 3 refills | Status: DC

## 2018-03-09 MED ORDER — SODIUM CHLORIDE (PF) 0.9 % IJ SOLN
INTRAMUSCULAR | Status: AC
  Filled 2018-03-09: qty 10

## 2018-03-09 MED ORDER — IOPAMIDOL (ISOVUE-M 200) INJECTION 41%
10.0000 mL | Freq: Once | INTRAMUSCULAR | Status: AC
  Administered 2018-03-09: 10 mL via EPIDURAL
  Filled 2018-03-09: qty 10

## 2018-03-09 MED ORDER — SODIUM CHLORIDE 0.9% FLUSH
2.0000 mL | Freq: Once | INTRAVENOUS | Status: AC
  Administered 2018-03-09: 10 mL

## 2018-03-09 MED ORDER — DULOXETINE HCL 20 MG PO CPEP: 40.0000 mg | ORAL_CAPSULE | Freq: Every day | ORAL | 3 refills | Status: DC

## 2018-03-09 MED ORDER — LIDOCAINE HCL 2 % IJ SOLN
10.0000 mL | Freq: Once | INTRAMUSCULAR | Status: AC
  Administered 2018-03-09: 400 mg
  Filled 2018-03-09: qty 200

## 2018-03-09 NOTE — Patient Instructions (Signed)

## 2018-03-09 NOTE — Progress Notes (Signed)
Safety precautions to be maintained throughout the outpatient stay will include: orient to surroundings, keep bed in low position, maintain call bell within reach at all times, provide assistance with transfer out of bed and ambulation.  

## 2018-03-09 NOTE — Progress Notes (Signed)
Patient's Name: Beth [name]  MRN: [id number]  Referring Provider: [name], MD  DOB: 03/06/1945  PCP: Steele Sizer, MD  DOS: 03/09/2018  Note by: [name], MD  Service setting: Ambulatory outpatient  Specialty: Interventional Pain Management  Patient type: Established  Location: [facility] (AMB) Pain Management Facility  Visit type: Interventional Procedure   Primary Reason for Visit: Interventional Pain Management Treatment. CC: Back Pain (lower) and Hip Pain (bilateral)  Procedure:          Anesthesia, Analgesia, Anxiolysis:  Type: Diagnostic Inter-Laminar Epidural Steroid Injection  #1  Region: Lumbar Level: L4-5 Level. Laterality: Midline         Type: Local Anesthesia Indication(s): Analgesia         Route: Infiltration (/IM) IV Access: Declined Sedation: Declined  Local Anesthetic: Lidocaine 1-2%  Position: Prone with head of the table was raised to facilitate breathing.   Indications: 1. Lumbar radiculopathy    Pain Score: Pre-procedure: 9 /10 Post-procedure: 2 /10  Pre-op Assessment:  Beth [name] is a 73 y.o. (year old), female patient, seen today for interventional treatment. She  has a past surgical history that includes Breast surgery (2011); Cervical discectomy; Appendectomy; Abdominal hysterectomy; Cholecystectomy; and Sinusotomy. Beth [name] has a current medication list which includes the following prescription(s): acetaminophen, aripiprazole, aspirin, atorvastatin, azelastine, duloxetine, fluticasone, fluticasone furoate-vilanterol, gabapentin, ipratropium-albuterol, metoprolol succinate, sertraline, chlorhexidine, and prednisone, and the following Facility-Administered Medications: albuterol. Her primarily concern today is the Back Pain (lower) and Hip Pain (bilateral)  Initial Vital Signs:  Pulse/HCG Rate: 60ECG Heart Rate: 67 Temp: 97.7 F (36.5 C) Resp: 16 BP: (!) 107/55 SpO2: 100 %  BMI: Estimated body mass index is 21.47 kg/m as calculated  from the following:   Height as of this encounter: 5\' 5"  (1.651 m).   Weight as of this encounter: 129 lb (58.5 kg).  Risk Assessment: Allergies: Reviewed. She is allergic to bextra  [valdecoxib]; compazine  [prochlorperazine edisylate]; lithium carbonate; and lyrica [pregabalin].  Allergy Precautions: None required Coagulopathies: Reviewed. None identified.  Blood-thinner therapy: None at this time Active Infection(s): Reviewed. None identified. Beth [name] is afebrile  Site Confirmation: Beth [name] was asked to confirm the procedure and laterality before marking the site Procedure checklist: Completed Consent: Before the procedure and under the influence of no sedative(s), amnesic(s), or anxiolytics, the patient was informed of the treatment options, risks and possible complications. To fulfill our ethical and legal obligations, as recommended by the American Medical Association's Code of Ethics, I have informed the patient of my clinical impression; the nature and purpose of the treatment or procedure; the risks, benefits, and possible complications of the intervention; the alternatives, including doing nothing; the risk(s) and benefit(s) of the alternative treatment(s) or procedure(s); and the risk(s) and benefit(s) of doing nothing. The patient was provided information about the general risks and possible complications associated with the procedure. These may include, but are not limited to: failure to achieve desired goals, infection, bleeding, organ or nerve damage, allergic reactions, paralysis, and death. In addition, the patient was informed of those risks and complications associated to Spine-related procedures, such as failure to decrease pain; infection (i.e.: Meningitis, epidural or intraspinal abscess); bleeding (i.e.: epidural hematoma, subarachnoid hemorrhage, or any other type of intraspinal or peri-dural bleeding); organ or nerve damage (i.e.: Any type of peripheral nerve,  nerve root, or spinal cord injury) with subsequent damage to sensory, motor, and/or autonomic systems, resulting in permanent pain, numbness, and/or weakness of one or several areas of the  body; allergic reactions; (i.e.: anaphylactic reaction); and/or death. Furthermore, the patient was informed of those risks and complications associated with the medications. These include, but are not limited to: allergic reactions (i.e.: anaphylactic or anaphylactoid reaction(s)); adrenal axis suppression; blood sugar elevation that in diabetics may result in ketoacidosis or comma; water retention that in patients with history of congestive heart failure may result in shortness of breath, pulmonary edema, and decompensation with resultant heart failure; weight gain; swelling or edema; medication-induced neural toxicity; particulate matter embolism and blood vessel occlusion with resultant organ, and/or nervous system infarction; and/or aseptic necrosis of one or more joints. Finally, the patient was informed that Medicine is not an exact science; therefore, there is also the possibility of unforeseen or unpredictable risks and/or possible complications that may result in a catastrophic outcome. The patient indicated having understood very clearly. We have given the patient no guarantees and we have made no promises. Enough time was given to the patient to ask questions, all of which were answered to the patient's satisfaction. Beth [name] has indicated that she wanted to continue with the procedure. Attestation: I, the ordering provider, attest that I have discussed with the patient the benefits, risks, side-effects, alternatives, likelihood of achieving goals, and potential problems during recovery for the procedure that I have provided informed consent. Date  Time: 03/09/2018  9:59 AM  Pre-Procedure Preparation:  Monitoring: As per clinic protocol. Respiration, ETCO2, SpO2, BP, heart rate and rhythm monitor placed and  checked for adequate function Safety Precautions: Patient was assessed for positional comfort and pressure points before starting the procedure. Time-out: I initiated and conducted the "Time-out" before starting the procedure, as per protocol. The patient was asked to participate by confirming the accuracy of the "Time Out" information. Verification of the correct person, site, and procedure were performed and confirmed by me, the nursing staff, and the patient. "Time-out" conducted as per Joint Commission's Universal Protocol (UP.01.01.01). Time: 1038  Description of Procedure:          Target Area: The interlaminar space, initially targeting the lower laminar border of the superior vertebral body. Approach: Paramedial approach. Area Prepped: Entire Posterior Lumbar Region Prepping solution: ChloraPrep (2% chlorhexidine gluconate and 70% isopropyl alcohol) Safety Precautions: Aspiration looking for blood return was conducted prior to all injections. At no point did we inject any substances, as a needle was being advanced. No attempts were made at seeking any paresthesias. Safe injection practices and needle disposal techniques used. Medications properly checked for expiration dates. SDV (single dose vial) medications used. Description of the Procedure: Protocol guidelines were followed. The procedure needle was introduced through the skin, ipsilateral to the reported pain, and advanced to the target area. Bone was contacted and the needle walked caudad, until the lamina was cleared. The epidural space was identified using "loss-of-resistance technique" with 2-3 ml of PF-NaCl (0.9% NSS), in a 5cc LOR glass syringe.  Vitals:   03/09/18 1031 03/09/18 1036 03/09/18 1040 03/09/18 1045  BP: 127/67 121/68 127/65 127/70  Pulse:      Resp: 17 11 15 15   Temp:      TempSrc:      SpO2: 98% 97% 97% 100%  Weight:      Height:        Start Time: 1038 hrs. End Time: 1045 hrs.  Materials:  Needle(s)  Type: Epidural needle Gauge: 17G Length: 3.5-in Medication(s): Please see orders for medications and dosing details. 8 cc solution made of 5 cc of preservative-free saline, 2  cc of 0.2% ropivacaine, 1 cc of Decadron 10 mg/cc Imaging Guidance (Spinal):          Type of Imaging Technique: Fluoroscopy Guidance (Spinal) Indication(s): Assistance in needle guidance and placement for procedures requiring needle placement in or near specific anatomical locations not easily accessible without such assistance. Exposure Time: Please see nurses notes. Contrast: Before injecting any contrast, we confirmed that the patient did not have an allergy to iodine, shellfish, or radiological contrast. Once satisfactory needle placement was completed at the desired level, radiological contrast was injected. Contrast injected under live fluoroscopy. No contrast complications. See chart for type and volume of contrast used. Fluoroscopic Guidance: I was personally present during the use of fluoroscopy. "Tunnel Vision Technique" used to obtain the best possible view of the target area. Parallax error corrected before commencing the procedure. "Direction-depth-direction" technique used to introduce the needle under continuous pulsed fluoroscopy. Once target was reached, antero-posterior, oblique, and lateral fluoroscopic projection used confirm needle placement in all planes. Images permanently stored in EMR. Interpretation: I personally interpreted the imaging intraoperatively. Adequate needle placement confirmed in multiple planes. Appropriate spread of contrast into desired area was observed. No evidence of afferent or efferent intravascular uptake. No intrathecal or subarachnoid spread observed. Permanent images saved into the patient's record.  Antibiotic Prophylaxis:   Anti-infectives (From admission, onward)   None     Indication(s): None identified  Post-operative Assessment:  Post-procedure Vital Signs:   Pulse/HCG Rate: 6061 Temp: 97.7 F (36.5 C) Resp: 15 BP: 127/70 SpO2: 100 %  EBL: None  Complications: No immediate post-treatment complications observed by team, or reported by patient.  Note: The patient tolerated the entire procedure well. A repeat set of vitals were taken after the procedure and the patient was kept under observation following institutional policy, for this type of procedure. Post-procedural neurological assessment was performed, showing return to baseline, prior to discharge. The patient was provided with post-procedure discharge instructions, including a section on how to identify potential problems. Should any problems arise concerning this procedure, the patient was given instructions to immediately contact us, at any time, without hesitation. In any case, we plan to contact the patient by telephone for a follow-up status report regarding this interventional procedure.  Comments:  No additional relevant information.  Plan of Care    Imaging Orders     DG C-Arm 1-60 Min-No Report Procedure Orders    No procedure(s) ordered today    Medications ordered for procedure: Meds ordered this encounter  Medications  . iopamidol (ISOVUE-M) 41 % intrathecal injection 10 mL  . ropivacaine (PF) 2 mg/mL (0.2%) (NAROPIN) injection 2 mL  . sodium chloride flush (NS) 0.9 % injection 2 mL  . lidocaine (XYLOCAINE) 2 % (with pres) injection 200 mg  . dexamethasone (DECADRON) injection 10 mg  . DISCONTD: DULoxetine (CYMBALTA) 20 MG capsule    Sig: Take 2 capsules (40 mg total) by mouth daily.    Dispense:  60 capsule    Refill:  3  . DISCONTD: gabapentin (NEURONTIN) 300 MG capsule    Sig: Take 2 capsules (600 mg total) by mouth 3 (three) times daily.    Dispense:  180 capsule    Refill:  3  . gabapentin (NEURONTIN) 300 MG capsule    Sig: Take 2 capsules (600 mg total) by mouth 3 (three) times daily.    Dispense:  180 capsule    Refill:  3  . DULoxetine (CYMBALTA) 20  MG capsule    Sig: Take  2 capsules (40 mg total) by mouth daily.    Dispense:  60 capsule    Refill:  3   Medications administered: We administered iopamidol, ropivacaine (PF) 2 mg/mL (0.2%), sodium chloride flush, lidocaine, and dexamethasone.  See the medical record for exact dosing, route, and time of administration.  Disposition: Discharge home  Discharge Date & Time: 03/09/2018; 1102 hrs.   Physician-requested Follow-up: Return in about 4 weeks (around 04/06/2018) for Post Procedure Evaluation.  Future Appointments  Date Time Provider [place]  04/07/2018 11:45 AM [name], MD [facility] None   Primary Care Physician: Steele Sizer, MD Location: [facility] Out[facility] Facility Note by: [name], MD Date: 03/09/2018; Time: 11:41 AM  Disclaimer:  Medicine is not an exact science. The only guarantee in medicine is that nothing is guaranteed. It is important to note that the decision to proceed with this intervention was based on the information collected from the patient. The Data and conclusions were drawn from the patient's questionnaire, the interview, and the physical examination. Because the information was provided in large part by the patient, it cannot be guaranteed that it has not been purposely or unconsciously manipulated. Every effort has been made to obtain as much relevant data as possible for this evaluation. It is important to note that the conclusions that lead to this procedure are derived in large part from the available data. Always take into account that the treatment will also be dependent on availability of resources and existing treatment guidelines, considered by other Pain Management Practitioners as being common knowledge and practice, at the time of the intervention. For Medico-Legal purposes, it is also important to point out that variation in procedural techniques and pharmacological choices are the acceptable norm. The indications,  contraindications, technique, and results of the above procedure should only be interpreted and judged by a Board-Certified Interventional Pain Specialist with extensive familiarity and expertise in the same exact procedure and technique.

## 2018-03-10 ENCOUNTER — Telehealth: Payer: Self-pay | Admitting: *Deleted

## 2018-03-10 NOTE — Telephone Encounter (Signed)
No answer and no way to leave voicemail.

## 2018-03-11 DIAGNOSIS — R69 Illness, unspecified: Secondary | ICD-10-CM | POA: Diagnosis not present

## 2018-04-01 DIAGNOSIS — R69 Illness, unspecified: Secondary | ICD-10-CM | POA: Diagnosis not present

## 2018-04-07 ENCOUNTER — Encounter: Payer: Self-pay | Admitting: Student in an Organized Health Care Education/Training Program

## 2018-04-07 ENCOUNTER — Ambulatory Visit
Payer: Medicare HMO | Attending: Student in an Organized Health Care Education/Training Program | Admitting: Student in an Organized Health Care Education/Training Program

## 2018-04-07 ENCOUNTER — Other Ambulatory Visit: Payer: Self-pay

## 2018-04-07 VITALS — BP 137/75 | HR 84 | Temp 98.4°F | Resp 16 | Ht 65.0 in | Wt 130.0 lb

## 2018-04-07 DIAGNOSIS — M47816 Spondylosis without myelopathy or radiculopathy, lumbar region: Secondary | ICD-10-CM

## 2018-04-07 DIAGNOSIS — B0229 Other postherpetic nervous system involvement: Secondary | ICD-10-CM

## 2018-04-07 DIAGNOSIS — M5416 Radiculopathy, lumbar region: Secondary | ICD-10-CM | POA: Diagnosis not present

## 2018-04-07 NOTE — Patient Instructions (Addendum)
Preparing for your procedure (without sedation) Instructions: . Oral Intake: Do not eat or drink anything for at least 3 hours prior to your procedure. . Transportation: Unless otherwise stated by your physician, you may drive yourself after the procedure. . Blood Pressure Medicine: Take your blood pressure medicine with a sip of water the morning of the procedure. . Insulin: Take only  of your normal insulin dose. . Preventing infections: Shower with an antibacterial soap the morning of your procedure. . Build-up your immune system: Take 1000 mg of Vitamin C with every meal (3 times a day) the day prior to your procedure. . Pregnancy: If you are pregnant, call and cancel the procedure. . Sickness: If you have a cold, fever, or any active infections, call and cancel the procedure. . Arrival: You must be in the facility at least 30 minutes prior to your scheduled procedure. . Children: Do not bring any children with you. . Dress appropriately: Bring dark clothing that you would not mind if they get stained. . Valuables: Do not bring any jewelry or valuables. Procedure appointments are reserved for interventional treatments only. Mar[place] Kitchen No Prescription Refills. . No medication changes will be discussed during procedure appointments. No disability issues will be discussed.    Plantar Fasciitis  Plantar fasciitis is a painful foot condition that affects the heel. It occurs when the band of tissue that connects the toes to the heel bone (plantar fascia) becomes irritated. This can happen as the result of exercising too much or doing other repetitive activities (overuse injury). The pain from plantar fasciitis can range from mild irritation to severe pain that makes it difficult to walk or move. The pain is usually worse in the morning after sleeping, or after sitting or lying down for a while. Pain may also be worse after long periods of walking or standing. What are the causes? This condition may  be caused by: Standing for long periods of time. Wearing shoes that do not have good arch support. Doing activities that put stress on joints (high-impact activities), including running, aerobics, and ballet. Being overweight. An abnormal way of walking (gait). Tight muscles in the back of your lower leg (calf). High arches in your feet. Starting a new athletic activity. What are the signs or symptoms? The main symptom of this condition is heel pain. Pain may: Be worse with first steps after a time of rest, especially in the morning after sleeping or after you have been sitting or lying down for a while. Be worse after long periods of standing still. Decrease after 30-45 minutes of activity, such as gentle walking. How is this diagnosed? This condition may be diagnosed based on your medical history and your symptoms. Your health care provider may ask questions about your activity level. Your health care provider will do a physical exam to check for: A tender area on the bottom of your foot. A high arch in your foot. Pain when you move your foot. Difficulty moving your foot. You may have imaging tests to confirm the diagnosis, such as: X-rays. Ultrasound. MRI. How is this treated? Treatment for plantar fasciitis depends on how severe your condition is. Treatment may include: Rest, ice, applying pressure (compression), and raising the affected foot (elevation). This may be called RICE therapy. Your health care provider may recommend RICE therapy along with over-the-counter pain medicines to manage your pain. Exercises to stretch your calves and your plantar fascia. A splint that holds your foot in a stretched, upward position while  you sleep (night splint). Physical therapy to relieve symptoms and prevent problems in the future. Injections of steroid medicine (cortisone) to relieve pain and inflammation. Stimulating your plantar fascia with electrical impulses (extracorporeal shock wave  therapy). This is usually the last treatment option before surgery. Surgery, if other treatments have not worked after 12 months. Follow these instructions at home:  Managing pain, stiffness, and swelling If directed, put ice on the painful area: Put ice in a plastic bag, or use a frozen bottle of water. Place a towel between your skin and the bag or bottle. Roll the bottom of your foot over the bag or bottle. Do this for 20 minutes, 2-3 times a day. Wear athletic shoes that have air-sole or gel-sole cushions, or try wearing soft shoe inserts that are designed for plantar fasciitis. Raise (elevate) your foot above the level of your heart while you are sitting or lying down. Activity Avoid activities that cause pain. Ask your health care provider what activities are safe for you. Do physical therapy exercises and stretches as told by your health care provider. Try activities and forms of exercise that are easier on your joints (low-impact). Examples include swimming, water aerobics, and biking. General instructions Take over-the-counter and prescription medicines only as told by your health care provider. Wear a night splint while sleeping, if told by your health care provider. Loosen the splint if your toes tingle, become numb, or turn cold and blue. Maintain a healthy weight, or work with your health care provider to lose weight as needed. Keep all follow-up visits as told by your health care provider. This is important. Contact a health care provider if you: Have symptoms that do not go away after caring for yourself at home. Have pain that gets worse. Have pain that affects your ability to move or do your daily activities. Summary Plantar fasciitis is a painful foot condition that affects the heel. It occurs when the band of tissue that connects the toes to the heel bone (plantar fascia) becomes irritated. The main symptom of this condition is heel pain that may be worse after  exercising too much or standing still for a long time. Treatment varies, but it usually starts with rest, ice, compression, and elevation (RICE therapy) and over-the-counter medicines to manage pain. This information is not intended to replace advice given to you by your health care provider. Make sure you discuss any questions you have with your health care provider. Document Released: 10/09/2000 Document Revised: 11/11/2016 Document Reviewed: 11/11/2016 Elsevier Interactive Patient Education  [employer]. .

## 2018-04-07 NOTE — Progress Notes (Signed)
Safety precautions to be maintained throughout the outpatient stay will include: orient to surroundings, keep bed in low position, maintain call bell within reach at all times, provide assistance with transfer out of bed and ambulation.  

## 2018-04-07 NOTE — Progress Notes (Signed)
Patient's Name: Beth [name]  MRN: [id number]  Referring Provider: Steele Sizer, MD  DOB: 1945-03-21  PCP: Steele Sizer, MD  DOS: 04/07/2018  Note by: [name], MD  Service setting: Ambulatory outpatient  Specialty: Interventional Pain Management  Location: [facility] (AMB) Pain Management Facility    Patient type: Established   Primary Reason(s) for Visit: Encounter for post-procedure evaluation of chronic illness with mild to moderate exacerbation CC: Back Pain (low) and Foot Pain (right)  HPI  Beth [name] is a 73 y.o. year old, female patient, who comes today for a post-procedure evaluation. She has Chronic LBP; Colon polyp; COPD, severe ([place]); CVA, old, hemiparesis ([place]); Dyslipidemia; Dysfunction of eustachian tube; Fibromyalgia syndrome; Gastro-esophageal reflux disease without esophagitis; Benign migrating glossitis; Cerebrovascular accident, old; H/O: pneumonia; Benign hypertension; IBS (irritable bowel syndrome); Low back pain with radiation; Chronic recurrent major depressive disorder ([place]); Dysmetabolic syndrome; Benign neoplasm of skin of trunk; OP (osteoporosis); Vitamin D deficiency; Marital problems; Migraine without aura and without status migrainosus, not intractable; Seasonal allergic rhinitis; Hyperglycemia; Senile purpura ([place]); Perennial allergic rhinitis with seasonal variation; Severe episode of recurrent major depressive disorder, without psychotic features ([place]); Moderate benzodiazepine use disorder ([place]); Major depressive disorder, recurrent, severe w/o psychotic behavior ([place]); Generalized anxiety disorder; Chronic neck and back pain; Protein-calorie malnutrition, mild ([place]); Urinary tract infection; Atherosclerosis of aorta ([place]); Lung nodule; Lumbar radiculopathy; Lumbar spondylosis; and Lumbar facet arthropathy on their problem list. Her primarily concern today is the Back Pain (low) and Foot Pain (right)  Pain Assessment: Location: Lower Back Radiating: radiates  into both hips Onset: More than a month ago Duration: Chronic pain Quality: Burning, Constant Severity: 8 /10 (subjective, self-reported pain score)  Note: Reported level is inconsistent with clinical observations.                         When using our objective Pain Scale, levels between 6 and 10/10 are said to belong in an emergency room, as it progressively worsens from a 6/10, described as severely limiting, requiring emergency care not usually available at an outpatient pain management facility. At a 6/10 level, communication becomes difficult and requires great effort. Assistance to reach the emergency department may be required. Facial flushing and profuse sweating along with potentially dangerous increases in heart rate and blood pressure will be evident. Effect on ADL: Limits activities Timing: Constant Modifying factors: injections, tylenol BP: 137/75  HR: 84  Beth [name] comes in today for post-procedure evaluation.  Further details on both, my assessment(s), as well as the proposed treatment plan, please see below.  Post-Procedure Assessment  03/09/2018 Procedure: L4-L5 ESI #1 Pre-procedure pain score:  9/10 Post-procedure pain score: 2/10         Influential Factors: BMI: 21.63 kg/m Intra-procedural challenges: None observed.         Assessment challenges: None detected.              Reported side-effects: None.        Post-procedural adverse reactions or complications: None reported         Sedation: Please see nurses note. When no sedatives are used, the analgesic levels obtained are directly associated to the effectiveness of the local anesthetics. However, when sedation is provided, the level of analgesia obtained during the initial 1 hour following the intervention, is believed to be the result of a combination of factors. These factors may include, but are not limited to: 1. The effectiveness of the local anesthetics used.  2. The effects of the analgesic(s) and/or  anxiolytic(s) used. 3. The degree of discomfort experienced by the patient at the time of the procedure. 4. The patients ability and reliability in recalling and recording the events. 5. The presence and influence of possible secondary gains and/or psychosocial factors. Reported result: Relief experienced during the 1st hour after the procedure: 100 % (Ultra-Short Term Relief)            Interpretative annotation: Clinically appropriate result. Analgesia during this period is likely to be Local Anesthetic and/or IV Sedative (Analgesic/Anxiolytic) related.          Effects of local anesthetic: The analgesic effects attained during this period are directly associated to the localized infiltration of local anesthetics and therefore cary significant diagnostic value as to the etiological location, or anatomical origin, of the pain. Expected duration of relief is directly dependent on the pharmacodynamics of the local anesthetic used. Long-acting (4-6 hours) anesthetics used.  Reported result: Relief during the next 4 to 6 hour after the procedure: 100 % (Short-Term Relief)            Interpretative annotation: Clinically appropriate result. Analgesia during this period is likely to be Local Anesthetic-related.          Long-term benefit: Defined as the period of time past the expected duration of local anesthetics (1 hour for short-acting and 4-6 hours for long-acting). With the possible exception of prolonged sympathetic blockade from the local anesthetics, benefits during this period are typically attributed to, or associated with, other factors such as analgesic sensory neuropraxia, antiinflammatory effects, or beneficial biochemical changes provided by agents other than the local anesthetics.  Reported result: Extended relief following procedure: 25 % (Long-Term Relief)            Interpretative annotation: Clinically possible results. Good relief. No permanent benefit expected. Inflammation plays a  part in the etiology to the pain.          Current benefits: Defined as reported results that persistent at this point in time.   Analgesia: 25 %            Function: Somewhat improved ROM: Somewhat improved Interpretative annotation: Recurrence of symptoms. No permanent benefit expected. Effective diagnostic intervention.          Interpretation: Results would suggest a successful diagnostic intervention.                  Plan:  Repeat treatment or therapy and compare extent and duration of benefits.                Laboratory Chemistry  Inflammation Markers (CRP: Acute Phase) (ESR: Chronic Phase) Lab Results  Component Value Date   CRP 0.3 03/21/2016   ESRSEDRATE 1 03/21/2016   LATICACIDVEN 1.3 12/23/2015                         Rheumatology Markers No results found for: RF, ANA, LABURIC, URICUR, LYMEIGGIGMAB, LYMEABIGMQN, HLAB27                      Renal Function Markers Lab Results  Component Value Date   BUN 12 09/08/2017   CREATININE 0.70 80/16/5537   BCR NOT APPLICABLE 48/27/0786   GFRAA 100 09/08/2017   GFRNONAA 86 09/08/2017                             Hepatic  Function Markers Lab Results  Component Value Date   AST 22 09/08/2017   ALT 17 09/08/2017   ALBUMIN 4.0 07/15/2017   ALKPHOS 81 07/15/2017   LIPASE 27 02/13/2016                        Electrolytes Lab Results  Component Value Date   NA 143 09/08/2017   K 4.1 09/08/2017   CL 104 09/08/2017   CALCIUM 10.0 09/08/2017   MG 1.8 12/25/2012                        Neuropathy Markers Lab Results  Component Value Date   HGBA1C 5.3 06/26/2017                        CNS Tests No results found for: COLORCSF, APPEARCSF, RBCCOUNTCSF, WBCCSF, POLYSCSF, LYMPHSCSF, EOSCSF, PROTEINCSF, GLUCCSF, JCVIRUS, CSFOLI, IGGCSF                      Bone Pathology Markers Lab Results  Component Value Date   VD25OH 37 03/21/2016                         Coagulation Parameters Lab Results  Component Value Date     INR 0.96 07/15/2017   LABPROT 12.7 07/15/2017   APTT 27 07/15/2017   PLT 260 09/08/2017                        Cardiovascular Markers Lab Results  Component Value Date   CKTOTAL 37 (L) 12/05/2016   TROPONINI <0.03 07/15/2017   HGB 14.6 09/08/2017   HCT 42.9 09/08/2017                         CA Markers No results found for: CEA, CA125, LABCA2                      Endocrine Markers Lab Results  Component Value Date   TSH 1.74 09/08/2017                        Note: Lab results reviewed.  Recent Diagnostic Imaging Results  DG C-Arm 1-60 Min-No Report Fluoroscopy was utilized by the requesting physician.  No radiographic  interpretation.   Complexity Note: Imaging results reviewed. Results shared with Ms. Bellin, using [employer].                               Meds   Current Outpatient Medications:  .  acetaminophen (TYLENOL) 325 MG tablet, Take 650 mg by mouth every 4 (four) hours as needed., Disp: , Rfl:  .  ARIPiprazole (ABILIFY) 10 MG tablet, Take 10 mg by mouth at bedtime., Disp: , Rfl: 0 .  aspirin 81 MG chewable tablet, Chew 81 mg daily by mouth., Disp: , Rfl:  .  atorvastatin (LIPITOR) 40 MG tablet, TAKE 1 TABLET BY MOUTH ONCE DAILY, Disp: 90 tablet, Rfl: 1 .  azelastine (ASTELIN) 0.1 % nasal spray, Place 1 spray into the nose daily., Disp: , Rfl:  .  chlorhexidine (PERIDEX) 0.12 % solution, 30 mLs by Mouth Rinse route as needed., Disp: , Rfl: 1 .  clonazePAM (KLONOPIN) 0.5 MG tablet, TK 1 T PO  DAILY PRN, Disp: , Rfl:  .  DULoxetine (CYMBALTA) 20 MG capsule, Take 2 capsules (40 mg total) by mouth daily., Disp: 60 capsule, Rfl: 3 .  fluticasone (FLONASE) 50 MCG/ACT nasal spray, Place 2 sprays into both nostrils daily., Disp: 16 g, Rfl: 6 .  fluticasone furoate-vilanterol (BREO ELLIPTA) 100-25 MCG/INH AEPB, Inhale 1 puff into the lungs daily., Disp: 60 each, Rfl: 5 .  gabapentin (NEURONTIN) 300 MG capsule, Take 2 capsules (600 mg total) by mouth 3 (three)  times daily., Disp: 180 capsule, Rfl: 3 .  Ipratropium-Albuterol (COMBIVENT RESPIMAT) 20-100 MCG/ACT AERS respimat, INHALE 1 PUFF BY MOUTH EVERY 6 HOURS. MUST LAST 3 MONTHS, Disp: 1 Inhaler, Rfl: 0 .  metoprolol succinate (TOPROL-XL) 25 MG 24 hr tablet, Take 12.5 mg by mouth daily. , Disp: , Rfl: 1 .  sertraline (ZOLOFT) 50 MG tablet, Take 50 mg by mouth every morning., Disp: , Rfl: 0 .  predniSONE (DELTASONE) 20 MG tablet, Take 1 tablet (20 mg total) by mouth daily with breakfast. (Patient not taking: Reported on 04/07/2018), Disp: 5 tablet, Rfl: 0  Current Facility-Administered Medications:  .  albuterol (PROVENTIL) (2.5 MG/3ML) 0.083% nebulizer solution 2.5 mg, 2.5 mg, Nebulization, Once, Poulose, Elizabeth E, NP  ROS  Constitutional: Denies any fever or chills Gastrointestinal: No reported hemesis, hematochezia, vomiting, or acute GI distress Musculoskeletal: Denies any acute onset joint swelling, redness, loss of ROM, or weakness Neurological: No reported episodes of acute onset apraxia, aphasia, dysarthria, agnosia, amnesia, paralysis, loss of coordination, or loss of consciousness  Allergies  Ms. Dieu is allergic to bextra  [valdecoxib]; compazine  [prochlorperazine edisylate]; lithium carbonate; and lyrica [pregabalin].  Kinder  Drug: Ms. Gottlieb  reports no history of drug use. Alcohol:  reports no history of alcohol use. Tobacco:  reports that she has been smoking cigarettes. She has a 26.25 pack-year smoking history. She has never used smokeless tobacco. Medical:  has a past medical history of Allergy, Anxiety, Asthma, Chronic pain syndrome, COPD (chronic obstructive pulmonary disease) ([place]), CVA (cerebral infarction), Depression, Fibromyalgia, Headache, Hyperlipidemia, Hypertension, IBS (irritable bowel syndrome), Stroke ([place]), and Vitamin D deficiency. Surgical: Ms. Alig  has a past surgical history that includes Breast surgery (2011); Cervical discectomy; Appendectomy;  Abdominal hysterectomy; Cholecystectomy; and Sinusotomy. Family: family history includes Alcohol abuse in her father; Anxiety disorder in her mother; Cancer in her father; Depression in her father and mother; Gallbladder disease in her father.  Constitutional Exam  General appearance: Well nourished, well developed, and well hydrated. In no apparent acute distress Vitals:   04/07/18 1135  BP: 137/75  Pulse: 84  Resp: 16  Temp: 98.4 F (36.9 C)  SpO2: 95%  Weight: 130 lb (59 kg)  Height: 5' 5" (1.651 m)   BMI Assessment: Estimated body mass index is 21.63 kg/m as calculated from the following:   Height as of this encounter: 5' 5" (1.651 m).   Weight as of this encounter: 130 lb (59 kg).  BMI interpretation table: BMI level Category Range association with higher incidence of chronic pain  <18 kg/m2 Underweight   18.5-24.9 kg/m2 Ideal body weight   25-29.9 kg/m2 Overweight Increased incidence by 20%  30-34.9 kg/m2 Obese (Class I) Increased incidence by 68%  35-39.9 kg/m2 Severe obesity (Class II) Increased incidence by 136%  >40 kg/m2 Extreme obesity (Class III) Increased incidence by 254%   Patient's current BMI Ideal Body weight  Body mass index is 21.63 kg/m. Ideal body weight: 57 kg (125 lb 10.6 oz) Adjusted ideal  body weight: 57.8 kg (127 lb 6.4 oz)   BMI Readings from Last 4 Encounters:  04/07/18 21.63 kg/m  03/09/18 21.47 kg/m  02/12/18 21.50 kg/m  09/08/17 18.95 kg/m   Wt Readings from Last 4 Encounters:  04/07/18 130 lb (59 kg)  03/09/18 129 lb (58.5 kg)  02/12/18 129 lb 3.2 oz (58.6 kg)  09/08/17 113 lb 14.4 oz (51.7 kg)  Psych/Mental status: Alert, oriented x 3 (person, place, & time)       Eyes: PERLA Respiratory: No evidence of acute respiratory distress  Cervical Spine Area Exam  Skin & Axial Inspection: Well healed scar from previous spine surgery detected Alignment: Symmetrical Functional ROM: Decreased ROM      Stability: No instability  detected Muscle Tone/Strength: Functionally intact. No obvious neuro-muscular anomalies detected. Sensory (Neurological): Musculoskeletal pain pattern Palpation: No palpable anomalies              Upper Extremity (UE) Exam    Side: Right upper extremity  Side: Left upper extremity  Skin & Extremity Inspection: Skin color, temperature, and hair growth are WNL. No peripheral edema or cyanosis. No masses, redness, swelling, asymmetry, or associated skin lesions. No contractures.  Skin & Extremity Inspection: Skin color, temperature, and hair growth are WNL. No peripheral edema or cyanosis. No masses, redness, swelling, asymmetry, or associated skin lesions. No contractures.  Functional ROM: Unrestricted ROM          Functional ROM: Unrestricted ROM          Muscle Tone/Strength: Functionally intact. No obvious neuro-muscular anomalies detected.  Muscle Tone/Strength: Functionally intact. No obvious neuro-muscular anomalies detected.  Sensory (Neurological): Unimpaired          Sensory (Neurological): Unimpaired          Palpation: No palpable anomalies              Palpation: No palpable anomalies              Provocative Test(s):  Phalen's test: deferred Tinel's test: deferred Apley's scratch test (touch opposite shoulder):  Action 1 (Across chest): deferred Action 2 (Overhead): deferred Action 3 (LB reach): deferred   Provocative Test(s):  Phalen's test: deferred Tinel's test: deferred Apley's scratch test (touch opposite shoulder):  Action 1 (Across chest): deferred Action 2 (Overhead): deferred Action 3 (LB reach): deferred    Thoracic Spine Area Exam  Skin & Axial Inspection: No masses, redness, or swelling Alignment: Symmetrical Functional ROM: Unrestricted ROM Stability: No instability detected Muscle Tone/Strength: Functionally intact. No obvious neuro-muscular anomalies detected. Sensory (Neurological): Unimpaired Muscle strength & Tone: No palpable anomalies  Lumbar Spine  Area Exam  Skin & Axial Inspection: No masses, redness, or swelling Alignment: Asymmetric Functional ROM: Improved after treatment       Stability: No instability detected Muscle Tone/Strength: Functionally intact. No obvious neuro-muscular anomalies detected. Sensory (Neurological): Improved Palpation: No palpable anomalies       Provocative Tests: Hyperextension/rotation test: (+) due to pain. Lumbar quadrant test (Kemp's test): (+) due to pain. Lateral bending test: deferred today       Patrick's Maneuver: deferred today                   FABER* test: deferred today                   S-I anterior distraction/compression test: deferred today         S-I lateral compression test: deferred today  S-I Thigh-thrust test: deferred today         S-I Gaenslen's test: deferred today         *(Flexion, ABduction and External Rotation)  Gait & Posture Assessment  Ambulation: Unassisted Gait: Relatively normal for age and body habitus Posture: WNL   Lower Extremity Exam    Side: Right lower extremity  Side: Left lower extremity  Stability: No instability observed          Stability: No instability observed          Skin & Extremity Inspection: Skin color, temperature, and hair growth are WNL. No peripheral edema or cyanosis. No masses, redness, swelling, asymmetry, or associated skin lesions. No contractures.  Skin & Extremity Inspection: Skin color, temperature, and hair growth are WNL. No peripheral edema or cyanosis. No masses, redness, swelling, asymmetry, or associated skin lesions. No contractures.  Functional ROM: Unrestricted ROM                  Functional ROM: Unrestricted ROM                  Muscle Tone/Strength: Functionally intact. No obvious neuro-muscular anomalies detected.  Muscle Tone/Strength: Functionally intact. No obvious neuro-muscular anomalies detected.  Sensory (Neurological): Unimpaired        Sensory (Neurological): Unimpaired        DTR: Patellar:  deferred today Achilles: deferred today Plantar: deferred today  DTR: Patellar: deferred today Achilles: deferred today Plantar: deferred today  Palpation: No palpable anomalies  Palpation: No palpable anomalies   Assessment   Status Diagnosis  Responding Controlled Controlled 1. Lumbar radiculopathy   2. Lumbar spondylosis   3. Lumbar facet arthropathy   4. Post herpetic neuralgia      Presents for follow-up status post L4-L5 ESI 1.  Endorses pain relief at approximately 50% for the first week with gradual return of pain thereafter.  Is currently endorsing approximately 25% pain relief and improvement in functional activities.  We discussed repeating lumbar epidural steroid injection #2.  Risks and benefits were again reviewed and patient would like to proceed.  Patient does not need refills on gabapentin or Cymbalta.  Patient is also endorsing right heel pain.  This is worse with walking.  Started approximately 2 weeks ago.  No inciting event.  No discoloration, trauma, increased swelling.  Likely related to plantar fasciitis.  Provided patient with instructions on AVS regarding information on plantar fasciitis and treatment options.  If not improved at time of next epidural, can consider referral to podiatrist.  Plan of Care  Lab-work, procedure(s), and/or referral(s): Orders Placed This Encounter  Procedures  . Lumbar Epidural Injection   Future considerations: Repeat lumbar facet medial branch nerve block #2 at L3, L4, L5; lumbar radiofrequency ablation  Provider-requested follow-up: Return in about 2 weeks (around 04/21/2018) for Procedure.  Future Appointments  Date Time Provider [place]  04/10/2018 10:20 AM Mikey College, NP [facility] None    Primary Care Physician: Steele Sizer, MD Location: [facility] Note by: [name], M.D Date: 04/07/2018; Time: 12:00 PM  Patient Instructions  Preparing for your procedure  (without sedation) Instructions: . Oral Intake: Do not eat or drink anything for at least 3 hours prior to your procedure. . Transportation: Unless otherwise stated by your physician, you may drive yourself after the procedure. . Blood Pressure Medicine: Take your blood pressure medicine with a sip of water the morning of the procedure. . Insulin: Take only  of your normal insulin dose. . Preventing infections: Shower with an antibacterial soap the morning of your procedure. . Build-up your immune system: Take 1000 mg of Vitamin C with every meal (3 times a day) the day prior to your procedure. . Pregnancy: If you are pregnant, call and cancel the procedure. . Sickness: If you have a cold, fever, or any active infections, call and cancel the procedure. . Arrival: You must be in the facility at least 30 minutes prior to your scheduled procedure. . Children: Do not bring any children with you. . Dress appropriately: Bring dark clothing that you would not mind if they get stained. . Valuables: Do not bring any jewelry or valuables. Procedure appointments are reserved for interventional treatments only. Marland Kitchen No Prescription Refills. . No medication changes will be discussed during procedure appointments. No disability issues will be discussed.    Plantar Fasciitis  Plantar fasciitis is a painful foot condition that affects the heel. It occurs when the band of tissue that connects the toes to the heel bone (plantar fascia) becomes irritated. This can happen as the result of exercising too much or doing other repetitive activities (overuse injury). The pain from plantar fasciitis can range from mild irritation to severe pain that makes it difficult to walk or move. The pain is usually worse in the morning after sleeping, or after sitting or lying down for a while. Pain may also be worse after long periods of walking or standing. What are the causes? This condition may be caused by: Standing for  long periods of time. Wearing shoes that do not have good arch support. Doing activities that put stress on joints (high-impact activities), including running, aerobics, and ballet. Being overweight. An abnormal way of walking (gait). Tight muscles in the back of your lower leg (calf). High arches in your feet. Starting a new athletic activity. What are the signs or symptoms? The main symptom of this condition is heel pain. Pain may: Be worse with first steps after a time of rest, especially in the morning after sleeping or after you have been sitting or lying down for a while. Be worse after long periods of standing still. Decrease after 30-45 minutes of activity, such as gentle walking. How is this diagnosed? This condition may be diagnosed based on your medical history and your symptoms. Your health care provider may ask questions about your activity level. Your health care provider will do a physical exam to check for: A tender area on the bottom of your foot. A high arch in your foot. Pain when you move your foot. Difficulty moving your foot. You may have imaging tests to confirm the diagnosis, such as: X-rays. Ultrasound. MRI. How is this treated? Treatment for plantar fasciitis depends on how severe your condition is. Treatment may include: Rest, ice, applying pressure (compression), and raising the affected foot (elevation). This may be called RICE therapy. Your health care provider may recommend RICE therapy along with over-the-counter pain medicines to manage your pain. Exercises to stretch your calves and your plantar fascia. A splint that holds your foot in a stretched, upward position while you sleep (night splint). Physical therapy to relieve symptoms and prevent problems in the future. Injections of steroid medicine (cortisone) to relieve pain and inflammation. Stimulating your plantar fascia with electrical impulses (extracorporeal shock wave therapy). This is usually  the last treatment option before surgery. Surgery, if other treatments have not worked after 12 months. Follow these instructions at home:  Managing pain,  stiffness, and swelling If directed, put ice on the painful area: Put ice in a plastic bag, or use a frozen bottle of water. Place a towel between your skin and the bag or bottle. Roll the bottom of your foot over the bag or bottle. Do this for 20 minutes, 2-3 times a day. Wear athletic shoes that have air-sole or gel-sole cushions, or try wearing soft shoe inserts that are designed for plantar fasciitis. Raise (elevate) your foot above the level of your heart while you are sitting or lying down. Activity Avoid activities that cause pain. Ask your health care provider what activities are safe for you. Do physical therapy exercises and stretches as told by your health care provider. Try activities and forms of exercise that are easier on your joints (low-impact). Examples include swimming, water aerobics, and biking. General instructions Take over-the-counter and prescription medicines only as told by your health care provider. Wear a night splint while sleeping, if told by your health care provider. Loosen the splint if your toes tingle, become numb, or turn cold and blue. Maintain a healthy weight, or work with your health care provider to lose weight as needed. Keep all follow-up visits as told by your health care provider. This is important. Contact a health care provider if you: Have symptoms that do not go away after caring for yourself at home. Have pain that gets worse. Have pain that affects your ability to move or do your daily activities. Summary Plantar fasciitis is a painful foot condition that affects the heel. It occurs when the band of tissue that connects the toes to the heel bone (plantar fascia) becomes irritated. The main symptom of this condition is heel pain that may be worse after exercising too much or standing  still for a long time. Treatment varies, but it usually starts with rest, ice, compression, and elevation (RICE therapy) and over-the-counter medicines to manage pain. This information is not intended to replace advice given to you by your health care provider. Make sure you discuss any questions you have with your health care provider. Document Released: 10/09/2000 Document Revised: 11/11/2016 Document Reviewed: 11/11/2016 Elsevier Interactive Patient Education  [employer]. .

## 2018-04-10 ENCOUNTER — Encounter: Payer: Self-pay | Admitting: Nurse Practitioner

## 2018-04-10 ENCOUNTER — Other Ambulatory Visit: Payer: Self-pay

## 2018-04-10 ENCOUNTER — Ambulatory Visit (INDEPENDENT_AMBULATORY_CARE_PROVIDER_SITE_OTHER): Payer: Medicare HMO | Admitting: Nurse Practitioner

## 2018-04-10 VITALS — BP 131/64 | HR 91 | Temp 98.4°F | Ht 65.0 in | Wt 142.0 lb

## 2018-04-10 DIAGNOSIS — J449 Chronic obstructive pulmonary disease, unspecified: Secondary | ICD-10-CM | POA: Diagnosis not present

## 2018-04-10 DIAGNOSIS — E785 Hyperlipidemia, unspecified: Secondary | ICD-10-CM

## 2018-04-10 DIAGNOSIS — I69359 Hemiplegia and hemiparesis following cerebral infarction affecting unspecified side: Secondary | ICD-10-CM | POA: Diagnosis not present

## 2018-04-10 DIAGNOSIS — I10 Essential (primary) hypertension: Secondary | ICD-10-CM

## 2018-04-10 DIAGNOSIS — Z7689 Persons encountering health services in other specified circumstances: Secondary | ICD-10-CM

## 2018-04-10 DIAGNOSIS — J301 Allergic rhinitis due to pollen: Secondary | ICD-10-CM

## 2018-04-10 MED ORDER — ATORVASTATIN CALCIUM 40 MG PO TABS: ORAL_TABLET | ORAL | 1 refills | Status: DC

## 2018-04-10 MED ORDER — AZELASTINE HCL 0.1 % NA SOLN: 2.0000 | Freq: Two times a day (BID) | NASAL | 2 refills | Status: DC

## 2018-04-10 MED ORDER — METOPROLOL SUCCINATE ER 25 MG PO TB24: 12.5000 mg | ORAL_TABLET | Freq: Every day | ORAL | 1 refills | Status: DC

## 2018-04-10 MED ORDER — IPRATROPIUM-ALBUTEROL 20-100 MCG/ACT IN AERS: INHALATION_SPRAY | RESPIRATORY_TRACT | 0 refills | Status: DC

## 2018-04-10 MED ORDER — LORATADINE 10 MG PO TABS: 10.0000 mg | ORAL_TABLET | Freq: Every day | ORAL | 11 refills | Status: DC

## 2018-04-10 MED ORDER — FLUTICASONE FUROATE-VILANTEROL 100-25 MCG/INH IN AEPB: 1.0000 | INHALATION_SPRAY | Freq: Every day | RESPIRATORY_TRACT | 5 refills | Status: DC

## 2018-04-10 NOTE — Patient Instructions (Addendum)
[name],   Thank you for coming in to clinic today.  1. You will be due for FASTING BLOOD WORK.  This means you should eat no food or drink after midnight.  Drink only water or coffee without cream/sugar on the morning of your lab visit. - Your results will be available about 2-3 days after blood draw.  If you have set up a MyChart account, you can can log in to MyChart online to view your results and a brief explanation. Also, we can discuss your results together at your next office visit if you would like.  2. Continue current medicines except:  - STOP Flonase if needed - RESTART astelin nasal spray.  - STOP benadryl - this will help reduce dizziness. - START generic Claritin (loratadine) 10 mg once daily.  Please schedule a follow-up appointment with Cassell Smiles, AGNP. Return in about 3 months (around 07/11/2018) for hypertension, COPD/Asthma.  If you have any other questions or concerns, please feel free to call the clinic or send a message through [facility]. You may also schedule an earlier appointment if necessary.  You will receive a survey after today's visit either digitally by e-mail or paper by [employer]. Your experiences and feedback matter to [place].  Please respond so we know how we are doing as we provide care for you.   Cassell Smiles, DNP, AGNP-BC Adult Gerontology Nurse Practitioner [place]

## 2018-04-10 NOTE — Progress Notes (Signed)
Subjective:    Patient ID: Beth [name], female    DOB: November 22, 1945, 73 y.o.   MRN: [id number]  Beth [name] is a 73 y.o. female presenting on 04/10/2018 for New Patient (Initial Visit) (She is a former patient of [facility]. She just recently moved to the area, and wanted a PCP closer to home.); Hypertension (She is requesting refills on medication (Metoprolol). Reports that BPs are contolled at home.); and shingles vaccine (Patient wanted to see if she could update shingles vaccine. She reports that her last one was over 1 year ago. Possibly needing her second dose. )   HPI Establish Care New Provider Pt last seen by PCP [facility] about 9 months ago.  Obtain records from [facility].  Hypertension   Patient continues on medications with readings at goal at home.  Endorses occasional dizziness, but only with sudden position changes. - Pt denies headache, changes in vision, chest tightness/pressure, palpitations, leg swelling, sudden loss of speech or loss of consciousness.   Hyperlipidemia Patient has been on atorvastatin 40 mg once daily.  Tolerating well without side effects.   - Pt denies changes in vision, chest tightness/pressure, palpitations, shortness of breath, leg pain while walking, leg or arm weakness, and sudden loss of speech or loss of consciousness.  - Patient has history of CVA with persistent left sided weakness.  No other deficiencies are noted.  COPD Patient has had COPD combined with asthma per her report.  She has been well managed on Breo daily, combivent prn rescue.  Requests refills today.  Patient denies unusual shortness of breath, chronic coughing, or problems breathing at rest or with exertion.  Due to increased seasonal allergies, patient has been taking Benadryl twice daily.  Is noting mild dizziness.  Shingles Vaccine: Patient requests shingles vaccine as she is not able to get her second dose.  Has had first dose between 12/2016 and 01/2017  per patient report. Patient has medicare.  Mental Health: Patient is current patient at [facility] where she is receiving her clonazepam, seroquel  Chronic pain: Patient continues treatment with Dr. [name] for DDD, chronic pain.  She is currently on gabapentin and duloxetine for pain control.  Past Medical History:  Diagnosis Date  . Allergy   . Anxiety   . Asthma   . Chronic pain syndrome    discharged from pain clinic, hx of narcotics seeking behavior  . COPD (chronic obstructive pulmonary disease) ([place])   . CVA (cerebral infarction)   . Depression   . Fibromyalgia   . Headache   . Hyperlipidemia   . Hypertension   . IBS (irritable bowel syndrome)   . Stroke ([place])   . Vitamin D deficiency    Past Surgical History:  Procedure Laterality Date  . ABDOMINAL HYSTERECTOMY    . APPENDECTOMY    . BREAST SURGERY  2011   biopsy  . CERVICAL DISCECTOMY    . CHOLECYSTECTOMY    . SINUSOTOMY     Social History   Socioeconomic History  . Marital status: Legally Separated    Spouse name: Not on file  . Number of children: 2  . Years of education: Not on file  . Highest education level: Not on file  Occupational History  . Not on file  Social Needs  . Financial resource strain: Very hard  . Food insecurity:    Worry: Never true    Inability: Never true  . Transportation needs:    Medical: No  Non-medical: No  Tobacco Use  . Smoking status: Current Some Day Smoker    Packs/day: 0.75    Years: 35.00    Pack years: 26.25    Types: Cigarettes  . Smokeless tobacco: Never Used  Substance and Sexual Activity  . Alcohol use: No    Alcohol/week: 0.0 standard drinks  . Drug use: No  . Sexual activity: Never  Lifestyle  . Physical activity:    Days per week: 0 days    Minutes per session: 0 min  . Stress: Very much  Relationships  . Social connections:    Talks on phone: Not on file    Gets together: Not on file    Attends religious service: Not on  file    Active member of club or organization: Not on file    Attends meetings of clubs or organizations: Not on file    Relationship status: Not on file  . Intimate partner violence:    Fear of current or ex partner: Not on file    Emotionally abused: Not on file    Physically abused: Not on file    Forced sexual activity: Not on file  Other Topics Concern  . Not on file  Social History Narrative  . Not on file   Family History  Problem Relation Age of Onset  . Anxiety disorder Mother   . Depression Mother   . Cancer Father   . Gallbladder disease Father   . Alcohol abuse Father   . Depression Father    Current Outpatient Medications on File Prior to Visit  Medication Sig  . acetaminophen (TYLENOL) 325 MG tablet Take 650 mg by mouth every 4 (four) hours as needed.  Marland Kitchen aspirin 81 MG chewable tablet Chew 81 mg daily by mouth.  Marland Kitchen atorvastatin (LIPITOR) 40 MG tablet TAKE 1 TABLET BY MOUTH ONCE DAILY  . azelastine (ASTELIN) 0.1 % nasal spray Place 1 spray into the nose daily.  . clonazePAM (KLONOPIN) 0.5 MG tablet TK 1 T PO DAILY PRN  . DULoxetine (CYMBALTA) 20 MG capsule Take 2 capsules (40 mg total) by mouth daily.  Marland Kitchen FLUoxetine (PROZAC) 20 MG tablet Take 20 mg by mouth daily.  . fluticasone (FLONASE) 50 MCG/ACT nasal spray Place 2 sprays into both nostrils daily.  . fluticasone furoate-vilanterol (BREO ELLIPTA) 100-25 MCG/INH AEPB Inhale 1 puff into the lungs daily.  Marland Kitchen gabapentin (NEURONTIN) 300 MG capsule Take 2 capsules (600 mg total) by mouth 3 (three) times daily.  . Ipratropium-Albuterol (COMBIVENT RESPIMAT) 20-100 MCG/ACT AERS respimat INHALE 1 PUFF BY MOUTH EVERY 6 HOURS. MUST LAST 3 MONTHS  . metoprolol succinate (TOPROL-XL) 25 MG 24 hr tablet Take 12.5 mg by mouth daily.   . ARIPiprazole (ABILIFY) 10 MG tablet Take 10 mg by mouth at bedtime.  . chlorhexidine (PERIDEX) 0.12 % solution 30 mLs by Mouth Rinse route as needed.  . predniSONE (DELTASONE) 20 MG tablet Take 1  tablet (20 mg total) by mouth daily with breakfast. (Patient not taking: Reported on 04/07/2018)  . sertraline (ZOLOFT) 50 MG tablet Take 50 mg by mouth every morning.   Current Facility-Administered Medications on File Prior to Visit  Medication  . albuterol (PROVENTIL) (2.5 MG/3ML) 0.083% nebulizer solution 2.5 mg    Review of Systems  Constitutional: Negative for activity change, appetite change and fatigue.  HENT: Negative for congestion, hearing loss and voice change.   Eyes: Negative for visual disturbance.  Respiratory: Negative for cough and shortness of breath.  Cardiovascular: Negative for chest pain, palpitations and leg swelling.  Gastrointestinal: Negative for constipation, diarrhea, nausea and vomiting.  Endocrine: Negative for cold intolerance and heat intolerance.  Genitourinary: Negative for dysuria, frequency and urgency.  Musculoskeletal: Positive for arthralgias and back pain. Negative for myalgias.  Skin: Negative for rash.  Allergic/Immunologic: Positive for environmental allergies. Negative for food allergies and immunocompromised state.  Neurological: Positive for weakness. Negative for dizziness and headaches.  Psychiatric/Behavioral: Positive for dysphoric mood and sleep disturbance. Negative for agitation, behavioral problems, confusion, self-injury and suicidal ideas. The patient is nervous/anxious.    Per HPI unless specifically indicated above     Objective:    BP 131/64 (BP Location: Right Arm, Patient Position: Sitting, Cuff Size: Normal)   Pulse 91   Temp 98.4 F (36.9 C)   Ht 5\' 5"  (1.651 m)   Wt 142 lb (64.4 kg)   BMI 23.63 kg/m   Wt Readings from Last 3 Encounters:  04/10/18 142 lb (64.4 kg)  04/07/18 130 lb (59 kg)  03/09/18 129 lb (58.5 kg)    Physical Exam Vitals signs reviewed.  Constitutional:      General: She is awake. She is not in acute distress.    Appearance: She is well-developed.  HENT:     Head: Normocephalic and  atraumatic.     Mouth/Throat:     Mouth: Mucous membranes are moist.     Pharynx: Oropharynx is clear.  Neck:     Musculoskeletal: Normal range of motion and neck supple. No neck rigidity.     Thyroid: No thyromegaly.     Vascular: No carotid bruit.  Cardiovascular:     Rate and Rhythm: Normal rate and regular rhythm.     Pulses:          Radial pulses are 2+ on the right side and 2+ on the left side.       Posterior tibial pulses are 1+ on the right side and 1+ on the left side.     Heart sounds: Normal heart sounds, S1 normal and S2 normal.  Pulmonary:     Effort: Pulmonary effort is normal. No respiratory distress.     Breath sounds: Normal breath sounds and air entry.  Musculoskeletal:     Right lower leg: No edema.     Left lower leg: No edema.  Skin:    General: Skin is warm and dry.     Capillary Refill: Capillary refill takes less than 2 seconds.  Neurological:     Mental Status: She is alert and oriented to person, place, and time. Mental status is at baseline.  Psychiatric:        Attention and Perception: Attention normal.        Mood and Affect: Mood and affect normal.        Behavior: Behavior normal. Behavior is cooperative.        Thought Content: Thought content normal.        Judgment: Judgment normal.    Results for orders placed or performed in visit on 04/10/18  COMPLETE METABOLIC PANEL WITH GFR  Result Value Ref Range   Glucose, Bld 97 65 - 99 mg/dL   BUN 19 7 - 25 mg/dL   Creat 1.00 (H) 0.60 - 0.93 mg/dL   GFR, Est Non African American 55 (L) > OR = 60 mL/min/1.26m2   GFR, Est African American 64 > OR = 60 mL/min/1.15m2   BUN/Creatinine Ratio 19 6 - 22 (calc)   Sodium  143 135 - 146 mmol/L   Potassium 5.0 3.5 - 5.3 mmol/L   Chloride 105 98 - 110 mmol/L   CO2 33 (H) 20 - 32 mmol/L   Calcium 9.6 8.6 - 10.4 mg/dL   Total Protein 6.6 6.1 - 8.1 g/dL   Albumin 3.9 3.6 - 5.1 g/dL   Globulin 2.7 1.9 - 3.7 g/dL (calc)   AG Ratio 1.4 1.0 - 2.5 (calc)    Total Bilirubin 0.4 0.2 - 1.2 mg/dL   Alkaline phosphatase (APISO) 109 37 - 153 U/L   AST 16 10 - 35 U/L   ALT 11 6 - 29 U/L  Lipid panel  Result Value Ref Range   Cholesterol 135 <200 mg/dL   HDL 59 > OR = 50 mg/dL   Triglycerides 50 <150 mg/dL   LDL Cholesterol (Calc) 64 mg/dL (calc)   Total CHOL/HDL Ratio 2.3 <5.0 (calc)   Non-HDL Cholesterol (Calc) 76 <130 mg/dL (calc)      Assessment & Plan:   Problem List Items Addressed This Visit      Cardiovascular and Mediastinum   Benign hypertension - Primary Stable today on exam.  Medications tolerated without side effects.  Continue at current doses.  Refills provided.  Check labs today. Followup 3 months.    Relevant Medications   metoprolol succinate (TOPROL-XL) 25 MG 24 hr tablet   atorvastatin (LIPITOR) 40 MG tablet     Respiratory   COPD, severe (HCC) Stable today on exam.  Medications tolerated without side effects.  Continue at current doses.  Refills provided. Consider future change to Anoro or Trelegy per GOLD guidelines for LAMA/LABA preferred first treatment of COPD. Recommend repeat PFT for staging of COPD in next 1 year. Followup 3 months.    Relevant Medications   fluticasone furoate-vilanterol (BREO ELLIPTA) 100-25 MCG/INH AEPB   Ipratropium-Albuterol (COMBIVENT RESPIMAT) 20-100 MCG/ACT AERS respimat   azelastine (ASTELIN) 0.1 % nasal spray   loratadine (CLARITIN) 10 MG tablet   Seasonal allergic rhinitis Consistent with chronic allergic rhinitis, seasonal, triggers are usually pollens. - Today no evidence of acute sinusitis or complication.  - Has not recently used loratadine, cetirizine, flonase.  Having side effects from benadryl (dizziness likely due to this drug not hypertension).  Plan: 1. Start astelin nasal spray - resume. STOP flonase due to side effect. 2. START antihistamine cetirizine or loratadine 10 mg once daily. STOP benadryl 3. Follow-up in future, as needed, consider 2nd opinion from ENT/allergy.    Relevant Medications   azelastine (ASTELIN) 0.1 % nasal spray   loratadine (CLARITIN) 10 MG tablet     Nervous and Auditory   CVA, old, hemiparesis ([place]) Stable without new ASCVD events.  Continue lipid management per plan below.   Relevant Medications   atorvastatin (LIPITOR) 40 MG tablet   Other Relevant Orders   COMPLETE METABOLIC PANEL WITH GFR (Completed)   Lipid panel (Completed)     Other   Dyslipidemia Previously uncontrolled hyperlipidemia.  At goals on last labs.  Currently taking atorvastatin 40 mg daily. No new ASCVD events since last visit.   - Personal history of prior ASCVD event - CVA  Plan: 1. Continue atorvastatin 40 mg once daily. 2. Discussed heart healthy diet 3. Discussed increasing exercise to 30 minutes most days of the week. 4. Labs today 5. Follow-up with labs in about 3 months.    Relevant Medications   atorvastatin (LIPITOR) 40 MG tablet   Other Relevant Orders   COMPLETE METABOLIC PANEL WITH GFR (Completed)  Lipid panel (Completed)    Other Visit Diagnoses    Encounter to establish care     Previous PCP was at [place].  Records are reviewed in [facility] today.  Past medical, family, and surgical history reviewed w/ pt.        Meds ordered this encounter  Medications  . metoprolol succinate (TOPROL-XL) 25 MG 24 hr tablet    Sig: Take 0.5 tablets (12.5 mg total) by mouth daily.    Dispense:  45 tablet    Refill:  1    Order Specific Question:   Supervising Provider    Answer:   [name] [[id number]]  . atorvastatin (LIPITOR) 40 MG tablet    Sig: TAKE 1 TABLET BY MOUTH ONCE DAILY    Dispense:  90 tablet    Refill:  1    Order Specific Question:   Supervising Provider    Answer:   [name] [[id number]]  . fluticasone furoate-vilanterol (BREO ELLIPTA) 100-25 MCG/INH AEPB    Sig: Inhale 1 puff into the lungs daily.    Dispense:  60 each    Refill:  5    Order Specific Question:   Supervising Provider     Answer:   [name] [[id number]]  . Ipratropium-Albuterol (COMBIVENT RESPIMAT) 20-100 MCG/ACT AERS respimat    Sig: INHALE 1 PUFF BY MOUTH EVERY 6 HOURS. MUST LAST 3 MONTHS    Dispense:  1 Inhaler    Refill:  0    Order Specific Question:   Supervising Provider    Answer:   [name] [[id number]]  . azelastine (ASTELIN) 0.1 % nasal spray    Sig: Place 2 sprays into both nostrils 2 (two) times daily.    Dispense:  30 mL    Refill:  2    Order Specific Question:   Supervising Provider    Answer:   [name] [[id number]]  . loratadine (CLARITIN) 10 MG tablet    Sig: Take 1 tablet (10 mg total) by mouth daily.    Dispense:  30 tablet    Refill:  11    Order Specific Question:   Supervising Provider    Answer:   [name] [[id number]]     Follow up plan: Return in about 3 months (around 07/11/2018) for hypertension, COPD/Asthma.  Cassell Smiles, DNP, AGPCNP-BC Adult Gerontology Primary Care Nurse Practitioner [facility] 04/10/2018, 10:51 AM

## 2018-04-11 LAB — COMPLETE METABOLIC PANEL WITH GFR
AG Ratio: 1.4 (calc) (ref 1.0–2.5)
ALT: 11 U/L (ref 6–29)
AST: 16 U/L (ref 10–35)
Albumin: 3.9 g/dL (ref 3.6–5.1)
Alkaline phosphatase (APISO): 109 U/L (ref 37–153)
BUN/Creatinine Ratio: 19 (calc) (ref 6–22)
BUN: 19 mg/dL (ref 7–25)
CO2: 33 mmol/L — ABNORMAL HIGH (ref 20–32)
Calcium: 9.6 mg/dL (ref 8.6–10.4)
Chloride: 105 mmol/L (ref 98–110)
Creat: 1 mg/dL — ABNORMAL HIGH (ref 0.60–0.93)
GFR, Est African American: 64 mL/min/{1.73_m2} (ref >=60)
GFR, Est Non African American: 55 mL/min/{1.73_m2} — ABNORMAL LOW (ref >=60)
Globulin: 2.7 g/dL (calc) (ref 1.9–3.7)
Glucose, Bld: 97 mg/dL (ref 65–99)
Potassium: 5 mmol/L (ref 3.5–5.3)
Sodium: 143 mmol/L (ref 135–146)
Total Bilirubin: 0.4 mg/dL (ref 0.2–1.2)
Total Protein: 6.6 g/dL (ref 6.1–8.1)

## 2018-04-11 LAB — LIPID PANEL
Cholesterol: 135 mg/dL (ref <200)
HDL: 59 mg/dL (ref >=50)
LDL Cholesterol (Calc): 64 mg/dL (calc)
Non-HDL Cholesterol (Calc): 76 mg/dL (calc) (ref <130)
Total CHOL/HDL Ratio: 2.3 (calc) (ref <5.0)
Triglycerides: 50 mg/dL (ref <150)

## 2018-04-16 ENCOUNTER — Encounter: Payer: Self-pay | Admitting: Nurse Practitioner

## 2018-04-21 ENCOUNTER — Ambulatory Visit: Payer: Self-pay | Admitting: Family Medicine

## 2018-04-29 DIAGNOSIS — R69 Illness, unspecified: Secondary | ICD-10-CM | POA: Diagnosis not present

## 2018-06-17 DIAGNOSIS — R69 Illness, unspecified: Secondary | ICD-10-CM | POA: Diagnosis not present

## 2018-07-13 ENCOUNTER — Ambulatory Visit: Payer: Medicare HMO | Admitting: Nurse Practitioner

## 2018-07-14 ENCOUNTER — Other Ambulatory Visit: Payer: Self-pay

## 2018-07-14 ENCOUNTER — Ambulatory Visit (INDEPENDENT_AMBULATORY_CARE_PROVIDER_SITE_OTHER): Payer: Medicare HMO | Admitting: Family Medicine

## 2018-07-14 ENCOUNTER — Encounter: Payer: Self-pay | Admitting: Family Medicine

## 2018-07-14 VITALS — BP 127/53 | HR 80 | Temp 97.8°F | Resp 16 | Ht 65.0 in | Wt 152.0 lb

## 2018-07-14 DIAGNOSIS — R600 Localized edema: Secondary | ICD-10-CM

## 2018-07-14 DIAGNOSIS — M25522 Pain in left elbow: Secondary | ICD-10-CM | POA: Diagnosis not present

## 2018-07-14 DIAGNOSIS — M25841 Other specified joint disorders, right hand: Secondary | ICD-10-CM | POA: Diagnosis not present

## 2018-07-14 DIAGNOSIS — G8929 Other chronic pain: Secondary | ICD-10-CM

## 2018-07-14 DIAGNOSIS — M19122 Post-traumatic osteoarthritis, left elbow: Secondary | ICD-10-CM | POA: Diagnosis not present

## 2018-07-14 DIAGNOSIS — M7022 Olecranon bursitis, left elbow: Secondary | ICD-10-CM

## 2018-07-14 MED ORDER — DICLOFENAC SODIUM 1 % TD GEL: 2.0000 g | Freq: Three times a day (TID) | TRANSDERMAL | 2 refills | Status: DC | PRN

## 2018-07-14 NOTE — Progress Notes (Signed)
Subjective:    Patient ID: Beth [name], female    DOB: 14-May-1945, 73 y.o.   MRN: [id number]  Beth [name] is a 73 y.o. female presenting on 07/14/2018 for Joint Swelling (left side onset couple of months) and Mass (right hand couple of months)  PCP is Cassell Smiles, AGPCNP-BC - I am currently covering during her maternity leave.   HPI   Left Elbow Swelling - History of Left Elbow fracture 10 years ago from fall, had surgery done at [facility] by Dr Tamala Julian, she has had some complications with this surgery in past few years, has episodes of pain and swelling. She says that the orthopedic recommended that they could "re-break" it but she did not want to proceed with this. - Now for past few months worsening pain and swelling, limiting her day to day function, worse if move left elbow the wrong way or put weight on it - Most recent orthopedic evaluation on file in chart, 07/2017 Dr Candelaria Stagers [facility], for this same issue, at that time discussed repeat imaging, and EMG/NCS and further management thought to have carpal tunnel as well, given PT, wrist bracer and OTC medications  Additional complaints  Right Hand cyst vs nodule - reports felt a "bump" within palm of hand on R hand, small pea sized slightly increased in size lately, non tender, no other issues, does not interfere with her fingers or movement, no redness  Venous insufficiency / Leg swelling - bilateral lower extremity, reports problem for a while, without significant improvement, has known HTN. She has tried some elevation, not on medication for this, not wearing compression wraps, worse if on feet a lot or if warmer weather. Denies redness, skin sore, pain or numbness   Depression screen [facility] 2/9 07/14/2018 04/07/2018 03/09/2018  Decreased Interest 1 0 0  Down, Depressed, Hopeless 1 0 0  PHQ - 2 Score 2 0 0  Altered sleeping 2 - -  Tired, decreased energy 2 - -  Change in appetite 2 - -  Feeling bad or failure about  yourself  3 - -  Trouble concentrating 1 - -  Moving slowly or fidgety/restless 3 - -  Suicidal thoughts 0 - -  PHQ-9 Score 15 - -  Difficult doing work/chores Somewhat difficult - -  Some recent data might be hidden    Social History   Tobacco Use  . Smoking status: Current Some Day Smoker    Packs/day: 0.75    Years: 35.00    Pack years: 26.25    Types: Cigarettes  . Smokeless tobacco: Current User  Substance Use Topics  . Alcohol use: No    Alcohol/week: 0.0 standard drinks  . Drug use: No    Review of Systems Per HPI unless specifically indicated above     Objective:    BP (!) 127/53   Pulse 80   Temp 97.8 F (36.6 C) (Oral)   Resp 16   Ht 5\' 5"  (1.651 m)   Wt 152 lb (68.9 kg)   BMI 25.29 kg/m   Wt Readings from Last 3 Encounters:  07/14/18 152 lb (68.9 kg)  04/10/18 142 lb (64.4 kg)  04/07/18 130 lb (59 kg)    Physical Exam Vitals signs and nursing note reviewed.  Constitutional:      General: She is not in acute distress.    Appearance: She is well-developed. She is not diaphoretic.     Comments: Well-appearing, comfortable, cooperative  HENT:  Head: Normocephalic and atraumatic.  Eyes:     General:        Right eye: No discharge.        Left eye: No discharge.     Conjunctiva/sclera: Conjunctivae normal.  Cardiovascular:     Rate and Rhythm: Normal rate.  Pulmonary:     Effort: Pulmonary effort is normal.  Musculoskeletal: Normal range of motion.     Right lower leg: Edema (+1 pitting edema, no erythema non tender) present.     Left lower leg: Edema (+1 pitting edema, no erythema non tender) present.     Comments: Left Elbow Inspection: soft tissue fluctuant swelling of olecranon bursa, otherwise some mild bulkiness of elbow without obvious deformity, no erythema Palpation: non tender, no warmth of elbow, soft fluctuance of L elbow, compared to R without any effusion or edema. ROM: mostly full active ROM Left elbow flex / extend, biceps  flex extend - only mild discomfort pain with L forearm supination and bicep flex and elbow flex Strength: 5/5 intact grip and elbow and bicep Neurovascular: distal sensation intact   Skin:    General: Skin is warm and dry.     Findings: No erythema or rash.  Neurological:     Mental Status: She is alert and oriented to person, place, and time.  Psychiatric:        Behavior: Behavior normal.     Comments: Well groomed, good eye contact, normal speech and thoughts    Results for orders placed or performed in visit on 04/10/18  COMPLETE METABOLIC PANEL WITH GFR  Result Value Ref Range   Glucose, Bld 97 65 - 99 mg/dL   BUN 19 7 - 25 mg/dL   Creat 1.00 (H) 0.60 - 0.93 mg/dL   GFR, Est Non African American 55 (L) > OR = 60 mL/min/1.23m2   GFR, Est African American 64 > OR = 60 mL/min/1.43m2   BUN/Creatinine Ratio 19 6 - 22 (calc)   Sodium 143 135 - 146 mmol/L   Potassium 5.0 3.5 - 5.3 mmol/L   Chloride 105 98 - 110 mmol/L   CO2 33 (H) 20 - 32 mmol/L   Calcium 9.6 8.6 - 10.4 mg/dL   Total Protein 6.6 6.1 - 8.1 g/dL   Albumin 3.9 3.6 - 5.1 g/dL   Globulin 2.7 1.9 - 3.7 g/dL (calc)   AG Ratio 1.4 1.0 - 2.5 (calc)   Total Bilirubin 0.4 0.2 - 1.2 mg/dL   Alkaline phosphatase (APISO) 109 37 - 153 U/L   AST 16 10 - 35 U/L   ALT 11 6 - 29 U/L  Lipid panel  Result Value Ref Range   Cholesterol 135 <200 mg/dL   HDL 59 > OR = 50 mg/dL   Triglycerides 50 <150 mg/dL   LDL Cholesterol (Calc) 64 mg/dL (calc)   Total CHOL/HDL Ratio 2.3 <5.0 (calc)   Non-HDL Cholesterol (Calc) 76 <130 mg/dL (calc)      Assessment & Plan:   Problem List Items Addressed This Visit    Post-traumatic osteoarthritis of left elbow   Relevant Medications   diclofenac sodium (VOLTAREN) 1 % GEL   Other Relevant Orders   Ambulatory referral to Orthopedic Surgery    Other Visit Diagnoses    Olecranon bursitis of left elbow    -  Primary   Relevant Medications   diclofenac sodium (VOLTAREN) 1 % GEL   Other  Relevant Orders   Ambulatory referral to Orthopedic Surgery   Cyst of joint  of right hand       Relevant Orders   Ambulatory referral to Orthopedic Surgery   Chronic elbow pain, left                Consistent with acute on chronic Left olecranon bursitis, without evidence of secondary infection or cellulitis Underlying known OA/DJD, prior fracture post traumatic arthritis - Range of motion of elbow is mostly normal, and not restricted - No new injury or trauma  Plan 1. Reassurance, likely benign and will improve acute effusion 2. Start topical Diclofenac gel TID PRN for reducing inflammation topically 3. RICE therapy - emphasis compression 4. Avoid prolong leaning pressure on elbow Referral back to [facility] previously established for similar problem, may warrant further management, repeat imaging. Complex history with existing prior surgery and appliance. - Return criteria given if worsening cellulitis or joint infection, when to go to hospital sooner  Bilateral lower extremity edema   Benign appearing, stable chronic problem Consistent with venous stasis Reassurance RICE therapy Future if persistent or problematic can consider diuretic.  Cyst of joint of right hand     Localized non tender, mobile seems consistent with cystic structure within palmar aspect, involving tendon of 4th digit most likely. Monitor and observe if worsening or impingement or new complication, notify office or return, consider orthopedic if persistent or bigger  Meds ordered this encounter  Medications  . diclofenac sodium (VOLTAREN) 1 % GEL    Sig: Apply 2 g topically 3 (three) times daily as needed (use on elbow for arthritis pain).    Dispense:  100 g    Refill:  2   Orders Placed This Encounter  Procedures  . Ambulatory referral to Orthopedic Surgery    Referral Priority:   Routine    Referral Type:   Surgical    Referral Reason:   Specialty Services Required    Requested Specialty:    Orthopedic Surgery    Number of Visits Requested:   1     Follow up plan: Return in about 6 weeks (around 08/25/2018) for HTN, COPD, Leg Swelling - w/ PCP.  Nobie Putnam, [facility] 07/14/2018, 1:27 PM

## 2018-07-14 NOTE — Patient Instructions (Signed)
Thank you for coming to the office today.  Stay tuned for Orthopedic appointment, they will call you with more information [address]ers, [address]  [place], [place] [address]  Phone: [phone]   You have an Olecranon Bursitis (elbow swelling) - this is due to inflammation within the joint, it is a problem we commonly see in patients with arthritis or can be triggered by frequent pressure on elbow (such as leaning on it for long periods of time)  - Usually this will resolve with time, body will re-absorb the fluid and it will go down  Use RICE therapy: - R - Rest / relative rest with activity modification avoid overuse of joint - I - Ice packs (make sure you use a towel or sock / something to protect skin) - C - Compression with Flexible Sleeve or ACE wrap to apply pressure and reduce swelling allowing more support - E - Elevation - if significant swelling lift above heart level  Start Topical diclofenac medicine for elbow - DO NOT TAKE any ibuprofen, aleve, motrin while you are taking this medicine - It is safe to take Tylenol Ext Str 500mg  tabs - take 1 to 2 (max dose 1000mg ) every 6 hours as needed for breakthrough pain, max 24 hour daily dose is 6 to 8 tablets or 4000mg   - If continued pressure and leaning on elbow it may last longer or may not go away, sometimes if it is persistent it may require drainage of fluid, however in most causes we are cautious to remove fluid because it will usually come back quickly - Rarely can it be infected, we will cover with antibiotics today to make sure there is no infection  If worsening fever, redness, chills, pain or swelling then we may need to cover with alternative antibiotic or drain the elbow, please notify office and if worsening symptoms then we may have you go to hospital for evaluation.   Please schedule a Follow-up Appointment to: No follow-ups on file.  If you have any other questions or concerns, please feel free  to call the office or send a message through [place]. You may also schedule an earlier appointment if necessary.  Additionally, you may be receiving a survey about your experience at our office within a few days to 1 week by e-mail or mail. We value your feedback.  Nobie Putnam, DO [place]

## 2018-07-16 ENCOUNTER — Other Ambulatory Visit: Payer: Self-pay | Admitting: Student in an Organized Health Care Education/Training Program

## 2018-07-16 ENCOUNTER — Other Ambulatory Visit: Payer: Self-pay

## 2018-07-16 DIAGNOSIS — M5416 Radiculopathy, lumbar region: Secondary | ICD-10-CM

## 2018-07-16 NOTE — Progress Notes (Unsigned)
Orders Placed This Encounter  Procedures  . Lumbar Epidural Injection    Standing Status:   Future    Standing Expiration Date:   08/15/2018    Scheduling Instructions:     Procedure: Interlaminar Lumbar Epidural Steroid injection (LESI)            Laterality: Midline     Sedation: Patient's choice.     Timeframe: ASAA    Order Specific Question:   Where will this procedure be performed?    Answer:   [facility]

## 2018-07-20 ENCOUNTER — Telehealth: Payer: Self-pay

## 2018-07-20 NOTE — Telephone Encounter (Signed)
Seen by me on 07/14/18, for olecranon bursitis, treated with rx Diclofenac topical gel.  We received PA for this rx.  Patient has tried and failed oral NSAID Naproxen, Ibuprofen in past.  Also - recommended treatment for topical due to nature of problem with elbow bursitis, it is more effective for this problem as well.  Lastly - if it is not covered or she decides, she can do a [employer] coupon and generic Diclofenac gel should only cost about $20-25 for a tube, not using insurance.  Nobie Putnam, [facility] 07/20/2018, 3:25 PM

## 2018-07-21 NOTE — Telephone Encounter (Signed)
The prior- auth was sent submitted for diclofenac. Pending approval.

## 2018-07-22 ENCOUNTER — Other Ambulatory Visit: Payer: Self-pay | Admitting: Nurse Practitioner

## 2018-07-22 DIAGNOSIS — J449 Chronic obstructive pulmonary disease, unspecified: Secondary | ICD-10-CM

## 2018-07-24 ENCOUNTER — Other Ambulatory Visit
Admission: RE | Admit: 2018-07-24 | Discharge: 2018-07-24 | Disposition: A | Discharge status: Home / Self Care | Payer: Medicare HMO | Source: Ambulatory Visit | Attending: Student in an Organized Health Care Education/Training Program | Admitting: Student in an Organized Health Care Education/Training Program

## 2018-07-24 ENCOUNTER — Other Ambulatory Visit: Payer: Self-pay

## 2018-07-24 DIAGNOSIS — Z1159 Encounter for screening for other viral diseases: Secondary | ICD-10-CM | POA: Insufficient documentation

## 2018-07-25 LAB — NOVEL CORONAVIRUS, NAA (HOSP ORDER, SEND-OUT TO REF LAB; TAT 18-24 HRS): SARS-CoV-2, NAA: NOT DETECTED

## 2018-07-28 DIAGNOSIS — M7022 Olecranon bursitis, left elbow: Secondary | ICD-10-CM | POA: Diagnosis not present

## 2018-07-28 DIAGNOSIS — R29898 Other symptoms and signs involving the musculoskeletal system: Secondary | ICD-10-CM | POA: Diagnosis not present

## 2018-07-28 DIAGNOSIS — M19122 Post-traumatic osteoarthritis, left elbow: Secondary | ICD-10-CM | POA: Diagnosis not present

## 2018-07-28 DIAGNOSIS — M25522 Pain in left elbow: Secondary | ICD-10-CM | POA: Diagnosis not present

## 2018-07-28 DIAGNOSIS — G8929 Other chronic pain: Secondary | ICD-10-CM | POA: Diagnosis not present

## 2018-07-29 ENCOUNTER — Ambulatory Visit (HOSPITAL_BASED_OUTPATIENT_CLINIC_OR_DEPARTMENT_OTHER): Payer: Medicare HMO | Admitting: Student in an Organized Health Care Education/Training Program

## 2018-07-29 ENCOUNTER — Other Ambulatory Visit: Payer: Self-pay

## 2018-07-29 ENCOUNTER — Encounter: Payer: Self-pay | Admitting: Student in an Organized Health Care Education/Training Program

## 2018-07-29 ENCOUNTER — Ambulatory Visit
Admission: RE | Admit: 2018-07-29 | Discharge: 2018-07-29 | Disposition: A | Discharge status: Home / Self Care | Payer: Medicare HMO | Source: Ambulatory Visit | Attending: Student in an Organized Health Care Education/Training Program | Admitting: Student in an Organized Health Care Education/Training Program

## 2018-07-29 DIAGNOSIS — M5416 Radiculopathy, lumbar region: Secondary | ICD-10-CM | POA: Diagnosis not present

## 2018-07-29 MED ORDER — ROPIVACAINE HCL 2 MG/ML IJ SOLN
1.0000 mL | Freq: Once | INTRAMUSCULAR | Status: AC
  Administered 2018-07-29: 12:00:00 2 mL via EPIDURAL
  Filled 2018-07-29: qty 10

## 2018-07-29 MED ORDER — DULOXETINE HCL 20 MG PO CPEP: 40.0000 mg | ORAL_CAPSULE | Freq: Every day | ORAL | 3 refills | Status: DC

## 2018-07-29 MED ORDER — LIDOCAINE HCL 2 % IJ SOLN
20.0000 mL | Freq: Once | INTRAMUSCULAR | Status: AC
  Administered 2018-07-29: 800 mg
  Filled 2018-07-29: qty 20

## 2018-07-29 MED ORDER — DEXAMETHASONE SODIUM PHOSPHATE 10 MG/ML IJ SOLN
10.0000 mg | Freq: Once | INTRAMUSCULAR | Status: AC
  Administered 2018-07-29: 10 mg
  Filled 2018-07-29: qty 1

## 2018-07-29 MED ORDER — IOHEXOL 180 MG/ML  SOLN
10.0000 mL | Freq: Once | INTRAMUSCULAR | Status: AC
  Administered 2018-07-29: 10 mL via EPIDURAL

## 2018-07-29 MED ORDER — TIZANIDINE HCL 4 MG PO CAPS: 4.0000 mg | ORAL_CAPSULE | Freq: Two times a day (BID) | ORAL | 1 refills | Status: AC | PRN

## 2018-07-29 MED ORDER — SODIUM CHLORIDE (PF) 0.9 % IJ SOLN
INTRAMUSCULAR | Status: AC
  Filled 2018-07-29: qty 10

## 2018-07-29 MED ORDER — GABAPENTIN 300 MG PO CAPS: 600.0000 mg | ORAL_CAPSULE | Freq: Three times a day (TID) | ORAL | 3 refills | Status: DC

## 2018-07-29 MED ORDER — SODIUM CHLORIDE 0.9% FLUSH
1.0000 mL | Freq: Once | INTRAVENOUS | Status: AC
  Administered 2018-07-29: 12:00:00

## 2018-07-29 NOTE — Progress Notes (Signed)
Safety precautions to be maintained throughout the outpatient stay will include: orient to surroundings, keep bed in low position, maintain call bell within reach at all times, provide assistance with transfer out of bed and ambulation.  

## 2018-07-29 NOTE — Progress Notes (Signed)
Patient's Name: Beth [name]  MRN: [id number]  Referring Provider: [name], MD  DOB: 14-Feb-1945  PCP: Mikey College, NP  DOS: 07/29/2018  Note by: [name], MD  Service setting: Ambulatory outpatient  Specialty: Interventional Pain Management  Patient type: Established  Location: [facility] (AMB) Pain Management Facility  Visit type: Interventional Procedure   Primary Reason for Visit: Interventional Pain Management Treatment. CC: Back Pain (bi) and Hip Pain (bilateral, right >left)  Procedure:          Anesthesia, Analgesia, Anxiolysis:  Type: Therapeutic Inter-Laminar Epidural Steroid Injection  #2  Region: Lumbar Level: L4-5 Level. Laterality: Right-Sided         Type: Local Anesthesia Indication(s): Analgesia         Route: Infiltration (Elim/IM) IV Access: Declined Sedation: Declined  Local Anesthetic: Lidocaine 1-2%  Position: Prone with head of the table was raised to facilitate breathing.   Indications: 1. Lumbar radiculopathy    Pain Score: Pre-procedure: 8 /10 Post-procedure: 0-No pain/10  Pre-op Assessment:  Beth [name] is a 73 y.o. (year old), female patient, seen today for interventional treatment. She  has a past surgical history that includes Breast surgery (2011); Cervical discectomy; Appendectomy; Abdominal hysterectomy; Cholecystectomy; and Sinusotomy. Beth [name] has a current medication list which includes the following prescription(s): acetaminophen, aripiprazole, aspirin, atorvastatin, azelastine, clonazepam, combivent respimat, diclofenac sodium, duloxetine, duloxetine, fluoxetine, fluticasone furoate-vilanterol, loratadine, melatonin, meloxicam, metoprolol succinate, quetiapine, gabapentin, and tizanidine, and the following Facility-Administered Medications: albuterol. Her primarily concern today is the Back Pain (bi) and Hip Pain (bilateral, right >left)  Initial Vital Signs:  Pulse/HCG Rate: 77ECG Heart Rate: 77 Temp: 98 F (36.7 C) Resp:  18 BP: 133/62 SpO2: 98 %  BMI: Estimated body mass index is 25.29 kg/m as calculated from the following:   Height as of this encounter: 5\' 5"  (1.651 m).   Weight as of this encounter: 152 lb (68.9 kg).  Risk Assessment: Allergies: Reviewed. She is allergic to bextra  [valdecoxib]; compazine [prochlorperazine edisylate]; lithium carbonate; and lyrica [pregabalin].  Allergy Precautions: None required Coagulopathies: Reviewed. None identified.  Blood-thinner therapy: None at this time Active Infection(s): Reviewed. None identified. Beth [name] is afebrile  Site Confirmation: Beth [name] was asked to confirm the procedure and laterality before marking the site Procedure checklist: Completed Consent: Before the procedure and under the influence of no sedative(s), amnesic(s), or anxiolytics, the patient was informed of the treatment options, risks and possible complications. To fulfill our ethical and legal obligations, as recommended by the American Medical Association's Code of Ethics, I have informed the patient of my clinical impression; the nature and purpose of the treatment or procedure; the risks, benefits, and possible complications of the intervention; the alternatives, including doing nothing; the risk(s) and benefit(s) of the alternative treatment(s) or procedure(s); and the risk(s) and benefit(s) of doing nothing. The patient was provided information about the general risks and possible complications associated with the procedure. These may include, but are not limited to: failure to achieve desired goals, infection, bleeding, organ or nerve damage, allergic reactions, paralysis, and death. In addition, the patient was informed of those risks and complications associated to Spine-related procedures, such as failure to decrease pain; infection (i.e.: Meningitis, epidural or intraspinal abscess); bleeding (i.e.: epidural hematoma, subarachnoid hemorrhage, or any other type of intraspinal  or peri-dural bleeding); organ or nerve damage (i.e.: Any type of peripheral nerve, nerve root, or spinal cord injury) with subsequent damage to sensory, motor, and/or autonomic systems, resulting in permanent pain,  numbness, and/or weakness of one or several areas of the body; allergic reactions; (i.e.: anaphylactic reaction); and/or death. Furthermore, the patient was informed of those risks and complications associated with the medications. These include, but are not limited to: allergic reactions (i.e.: anaphylactic or anaphylactoid reaction(s)); adrenal axis suppression; blood sugar elevation that in diabetics may result in ketoacidosis or comma; water retention that in patients with history of congestive heart failure may result in shortness of breath, pulmonary edema, and decompensation with resultant heart failure; weight gain; swelling or edema; medication-induced neural toxicity; particulate matter embolism and blood vessel occlusion with resultant organ, and/or nervous system infarction; and/or aseptic necrosis of one or more joints. Finally, the patient was informed that Medicine is not an exact science; therefore, there is also the possibility of unforeseen or unpredictable risks and/or possible complications that may result in a catastrophic outcome. The patient indicated having understood very clearly. We have given the patient no guarantees and we have made no promises. Enough time was given to the patient to ask questions, all of which were answered to the patient's satisfaction. Beth [name] has indicated that she wanted to continue with the procedure. Attestation: I, the ordering provider, attest that I have discussed with the patient the benefits, risks, side-effects, alternatives, likelihood of achieving goals, and potential problems during recovery for the procedure that I have provided informed consent. Date  Time: 07/29/2018 10:50 AM  Pre-Procedure Preparation:  Monitoring: As per  clinic protocol. Respiration, ETCO2, SpO2, BP, heart rate and rhythm monitor placed and checked for adequate function Safety Precautions: Patient was assessed for positional comfort and pressure points before starting the procedure. Time-out: I initiated and conducted the "Time-out" before starting the procedure, as per protocol. The patient was asked to participate by confirming the accuracy of the "Time Out" information. Verification of the correct person, site, and procedure were performed and confirmed by me, the nursing staff, and the patient. "Time-out" conducted as per Joint Commission's Universal Protocol (UP.01.01.01). Time: 1202  Description of Procedure:          Target Area: The interlaminar space, initially targeting the lower laminar border of the superior vertebral body. Approach: Paramedial approach. Area Prepped: Entire Posterior Lumbar Region Prepping solution: ChloraPrep (2% chlorhexidine gluconate and 70% isopropyl alcohol) Safety Precautions: Aspiration looking for blood return was conducted prior to all injections. At no point did we inject any substances, as a needle was being advanced. No attempts were made at seeking any paresthesias. Safe injection practices and needle disposal techniques used. Medications properly checked for expiration dates. SDV (single dose vial) medications used. Description of the Procedure: Protocol guidelines were followed. The procedure needle was introduced through the skin, ipsilateral to the reported pain, and advanced to the target area. Bone was contacted and the needle walked caudad, until the lamina was cleared. The epidural space was identified using "loss-of-resistance technique" with 2-3 ml of PF-NaCl (0.9% NSS), in a 5cc LOR glass syringe.  Vitals:   07/29/18 1200 07/29/18 1205 07/29/18 1210 07/29/18 1215  BP: 120/68 129/67 122/63 128/64  Pulse:      Resp: 17 17 16 17   Temp:      SpO2: 100% 99% 99% 100%  Weight:      Height:         Start Time: 1202 hrs. End Time: 1212 hrs.  Materials:  Needle(s) Type: Epidural needle Gauge: 17G Length: 3.5-in Medication(s): Please see orders for medications and dosing details. 8 cc solution made of 5 cc of  preservative-free saline, 2 cc of 0.2% ropivacaine, 1 cc of Decadron 10 mg/cc Imaging Guidance (Spinal):          Type of Imaging Technique: Fluoroscopy Guidance (Spinal) Indication(s): Assistance in needle guidance and placement for procedures requiring needle placement in or near specific anatomical locations not easily accessible without such assistance. Exposure Time: Please see nurses notes. Contrast: Before injecting any contrast, we confirmed that the patient did not have an allergy to iodine, shellfish, or radiological contrast. Once satisfactory needle placement was completed at the desired level, radiological contrast was injected. Contrast injected under live fluoroscopy. No contrast complications. See chart for type and volume of contrast used. Fluoroscopic Guidance: I was personally present during the use of fluoroscopy. "Tunnel Vision Technique" used to obtain the best possible view of the target area. Parallax error corrected before commencing the procedure. "Direction-depth-direction" technique used to introduce the needle under continuous pulsed fluoroscopy. Once target was reached, antero-posterior, oblique, and lateral fluoroscopic projection used confirm needle placement in all planes. Images permanently stored in EMR. Interpretation: I personally interpreted the imaging intraoperatively. Adequate needle placement confirmed in multiple planes. Appropriate spread of contrast into desired area was observed. No evidence of afferent or efferent intravascular uptake. No intrathecal or subarachnoid spread observed. Permanent images saved into the patient's record.  Antibiotic Prophylaxis:   Anti-infectives (From admission, onward)   None     Indication(s): None  identified  Post-operative Assessment:  Post-procedure Vital Signs:  Pulse/HCG Rate: 7770 Temp: 98 F (36.7 C) Resp: 17 BP: 128/64 SpO2: 100 %  EBL: None  Complications: No immediate post-treatment complications observed by team, or reported by patient.  Note: The patient tolerated the entire procedure well. A repeat set of vitals were taken after the procedure and the patient was kept under observation following institutional policy, for this type of procedure. Post-procedural neurological assessment was performed, showing return to baseline, prior to discharge. The patient was provided with post-procedure discharge instructions, including a section on how to identify potential problems. Should any problems arise concerning this procedure, the patient was given instructions to immediately contact us, at any time, without hesitation. In any case, we plan to contact the patient by telephone for a follow-up status report regarding this interventional procedure.  Comments:  No additional relevant information.  Plan of Care    Imaging Orders     DG PAIN CLINIC C-ARM 1-60 MIN NO REPORT Procedure Orders    No procedure(s) ordered today   Refill Gabapentin, Cymbalta, as below. New Rx for Tizanidine for muscle spasms.  Medications ordered for procedure: Meds ordered this encounter  Medications  . DULoxetine (CYMBALTA) 20 MG capsule    Sig: Take 2 capsules (40 mg total) by mouth daily.    Dispense:  60 capsule    Refill:  3  . gabapentin (NEURONTIN) 300 MG capsule    Sig: Take 2 capsules (600 mg total) by mouth 3 (three) times daily.    Dispense:  180 capsule    Refill:  3  . tiZANidine (ZANAFLEX) 4 MG capsule    Sig: Take 1 capsule (4 mg total) by mouth 2 (two) times daily as needed for muscle spasms. Do not drive while taking this medication.    Dispense:  60 capsule    Refill:  1  . iohexol (OMNIPAQUE) 180 MG/ML injection 10 mL    Must be Myelogram-compatible. If not available,  you may substitute with a water-soluble, non-ionic, hypoallergenic, myelogram-compatible radiological contrast medium.  Marland Kitchen lidocaine (XYLOCAINE)  2 % (with pres) injection 400 mg  . sodium chloride flush (NS) 0.9 % injection 1 mL  . ropivacaine (PF) 2 mg/mL (0.2%) (NAROPIN) injection 1 mL  . dexamethasone (DECADRON) injection 10 mg   Medications administered: We administered iohexol, lidocaine, sodium chloride flush, ropivacaine (PF) 2 mg/mL (0.2%), and dexamethasone.  See the medical record for exact dosing, route, and time of administration.  Disposition: Discharge home  Discharge Date & Time: 07/29/2018; 1220 hrs.   Physician-requested Follow-up: Return in about 6 weeks (around 09/09/2018) for Post Procedure Evaluation, virtual.  Future Appointments  Date Time Provider [place]  09/10/2018 10:15 AM [name], MD [facility] None   Primary Care Physician: Mikey College, NP Location: [facility] Note by: [name], MD Date: 07/29/2018; Time: 3:34 PM  Disclaimer:  Medicine is not an exact science. The only guarantee in medicine is that nothing is guaranteed. It is important to note that the decision to proceed with this intervention was based on the information collected from the patient. The Data and conclusions were drawn from the patient's questionnaire, the interview, and the physical examination. Because the information was provided in large part by the patient, it cannot be guaranteed that it has not been purposely or unconsciously manipulated. Every effort has been made to obtain as much relevant data as possible for this evaluation. It is important to note that the conclusions that lead to this procedure are derived in large part from the available data. Always take into account that the treatment will also be dependent on availability of resources and existing treatment guidelines, considered by other Pain Management Practitioners as being  common knowledge and practice, at the time of the intervention. For Medico-Legal purposes, it is also important to point out that variation in procedural techniques and pharmacological choices are the acceptable norm. The indications, contraindications, technique, and results of the above procedure should only be interpreted and judged by a Board-Certified Interventional Pain Specialist with extensive familiarity and expertise in the same exact procedure and technique.

## 2018-07-30 ENCOUNTER — Telehealth: Payer: Self-pay | Admitting: *Deleted

## 2018-07-30 NOTE — Telephone Encounter (Signed)
Spoke with patient re; procedure on yesterday.  Denies and questions or concerns.

## 2018-08-04 ENCOUNTER — Other Ambulatory Visit: Payer: Self-pay | Admitting: Student in an Organized Health Care Education/Training Program

## 2018-08-05 DIAGNOSIS — R69 Illness, unspecified: Secondary | ICD-10-CM | POA: Diagnosis not present

## 2018-09-06 ENCOUNTER — Other Ambulatory Visit: Payer: Self-pay | Admitting: Nurse Practitioner

## 2018-09-06 DIAGNOSIS — I10 Essential (primary) hypertension: Secondary | ICD-10-CM

## 2018-09-09 ENCOUNTER — Encounter: Payer: Self-pay | Admitting: Student in an Organized Health Care Education/Training Program

## 2018-09-10 ENCOUNTER — Other Ambulatory Visit: Payer: Self-pay

## 2018-09-10 ENCOUNTER — Encounter: Payer: Self-pay | Admitting: Student in an Organized Health Care Education/Training Program

## 2018-09-10 ENCOUNTER — Ambulatory Visit
Payer: Medicare HMO | Attending: Student in an Organized Health Care Education/Training Program | Admitting: Student in an Organized Health Care Education/Training Program

## 2018-09-10 DIAGNOSIS — M5416 Radiculopathy, lumbar region: Secondary | ICD-10-CM | POA: Diagnosis not present

## 2018-09-10 DIAGNOSIS — G894 Chronic pain syndrome: Secondary | ICD-10-CM

## 2018-09-10 DIAGNOSIS — M25551 Pain in right hip: Secondary | ICD-10-CM

## 2018-09-10 DIAGNOSIS — M5136 Other intervertebral disc degeneration, lumbar region: Secondary | ICD-10-CM | POA: Insufficient documentation

## 2018-09-10 DIAGNOSIS — M25552 Pain in left hip: Secondary | ICD-10-CM | POA: Insufficient documentation

## 2018-09-10 DIAGNOSIS — M545 Low back pain, unspecified: Secondary | ICD-10-CM

## 2018-09-10 DIAGNOSIS — G8929 Other chronic pain: Secondary | ICD-10-CM | POA: Diagnosis not present

## 2018-09-10 NOTE — Progress Notes (Signed)
Pain Management Virtual Encounter Note - Virtual Visit via Telephone Telehealth (real-time audio visits between healthcare provider and patient).   Patient's Phone No. & Preferred Pharmacy:  [phone] (home); [phone] (mobile); (Preferred) [phone] No e-mail address on record  [address], [place] [address] Phone: [phone] Fax: [phone]    Pre-screening note:  Our staff contacted Beth [name] and offered her an "in person", "face-to-face" appointment versus a telephone encounter. She indicated preferring the telephone encounter, at this time.   Reason for Virtual Visit: COVID-19*  Social distancing based on CDC and AMA recommendations.   I contacted Beth [name] on 09/10/2018 via telephone.      I clearly identified myself as [name], MD. I verified that I was speaking with the correct person using two identifiers (Name: Beth [name], and date of birth: 1945-11-17).  Advanced Informed Consent I sought verbal advanced consent from Beth [name] for virtual visit interactions. I informed Beth [name] of possible security and privacy concerns, risks, and limitations associated with providing "not-in-person" medical evaluation and management services. I also informed Beth [name] of the availability of "in-person" appointments. Finally, I informed her that there would be a charge for the virtual visit and that she could be  personally, fully or partially, financially responsible for it. Beth [name] expressed understanding and agreed to proceed.   Historic Elements   Beth [name] is a 73 y.o. year old, female patient evaluated today after her last encounter by our practice on 08/04/2018. Beth [name]  has a past medical history of Allergy, Anxiety, Asthma, Chronic pain syndrome, COPD (chronic obstructive pulmonary disease) ([place]), CVA (cerebral  infarction), Depression, Fibromyalgia, Headache, Hyperlipidemia, Hypertension, IBS (irritable bowel syndrome), Stroke ([place]), and Vitamin D deficiency. She also  has a past surgical history that includes Breast surgery (2011); Cervical discectomy; Appendectomy; Abdominal hysterectomy; Cholecystectomy; and Sinusotomy. Beth [name] has a current medication list which includes the following prescription(s): acetaminophen, aripiprazole, aspirin, atorvastatin, azelastine, clonazepam, combivent respimat, diclofenac sodium, duloxetine, fluticasone furoate-vilanterol, gabapentin, melatonin, meloxicam, metoprolol succinate, quetiapine, tizanidine, duloxetine, fluoxetine, and loratadine, and the following Facility-Administered Medications: albuterol. She  reports that she has been smoking cigarettes. She has a 26.25 pack-year smoking history. She uses smokeless tobacco. She reports that she does not drink alcohol or use drugs. Beth [name] is allergic to bextra  [valdecoxib]; compazine [prochlorperazine edisylate]; lithium carbonate; and lyrica [pregabalin].   HPI  Today, she is being contacted for a post-procedure assessment.  Evaluation of last interventional procedure  08/04/2018 Procedure: Right L4/5 #2 w.o sedation  Influential Factors: Intra-procedural challenges: None observed.         Reported side-effects: None.        Post-procedural adverse reactions or complications: None reported         Sedation: Please see nurses note for DOS. When no sedatives are used, the analgesic levels obtained are directly associated to the effectiveness of the local anesthetics. However, when sedation is provided, the level of analgesia obtained during the initial 1 hour following the intervention, is believed to be the result of a combination of factors. These factors may include, but are not limited to: 1. The effectiveness of the local anesthetics used. 2. The effects of the analgesic(s) and/or anxiolytic(s) used. 3.  The degree of discomfort experienced by the patient at the time of the procedure. 4. The patients ability and  reliability in recalling and recording the events. 5. The presence and influence of possible secondary gains and/or psychosocial factors. Reported result: Relief experienced during the 1st hour after the procedure: 100 % (Ultra-Short Term Relief)            Interpretative annotation: Clinically appropriate result. Analgesia during this period is likely to be Local Anesthetic and/or IV Sedative (Analgesic/Anxiolytic) related.          Effects of local anesthetic: The analgesic effects attained during this period are directly associated to the localized infiltration of local anesthetics and therefore cary significant diagnostic value as to the etiological location, or anatomical origin, of the pain. Expected duration of relief is directly dependent on the pharmacodynamics of the local anesthetic used. Long-acting (4-6 hours) anesthetics used.  Reported result: Relief during the next 4 to 6 hour after the procedure: 100 % (Short-Term Relief)            Interpretative annotation: Clinically appropriate result. Analgesia during this period is likely to be Local Anesthetic-related.          Long-term benefit: Defined as the period of time past the expected duration of local anesthetics (1 hour for short-acting and 4-6 hours for long-acting). With the possible exception of prolonged sympathetic blockade from the local anesthetics, benefits during this period are typically attributed to, or associated with, other factors such as analgesic sensory neuropraxia, antiinflammatory effects, or beneficial biochemical changes provided by agents other than the local anesthetics.  Reported result: Extended relief following procedure: 90 %(pain relief lasted approx 6 weeks.) (Long-Term Relief)            Interpretative annotation: Clinically appropriate result. Good relief. No permanent benefit expected.  Inflammation plays a part in the etiology to the pain.          UDS:  Summary  Date Value Ref Range Status  02/24/2017 FINAL  Final    Comment:    ==================================================================== [facility] COMP DRUG ANALYSIS,UR ==================================================================== Test                             Result       Flag       Units Drug Present and Declared for Prescription Verification   Gabapentin                     PRESENT      EXPECTED   Aripiprazole                   PRESENT      EXPECTED   Metoprolol                     PRESENT      EXPECTED Drug Present not Declared for Prescription Verification   7-aminoclonazepam              368          UNEXPECTED ng/mg creat    7-aminoclonazepam is an expected metabolite of clonazepam. Source    of clonazepam is a scheduled prescription medication.   Citalopram                     PRESENT      UNEXPECTED   Desmethylcitalopram            PRESENT      UNEXPECTED    Desmethylcitalopram is an expected metabolite of citalopram or  the enantiomeric form, escitalopram.   Hydroxyzine                    PRESENT      UNEXPECTED Drug Absent but Declared for Prescription Verification   Duloxetine                     Not Detected UNEXPECTED   Acetaminophen                  Not Detected UNEXPECTED    Acetaminophen, as indicated in the declared medication list, is    not always detected even when used as directed.   Salicylate                     Not Detected UNEXPECTED    Aspirin, as indicated in the declared medication list, is not    always detected even when used as directed. ==================================================================== Test                      Result    Flag   Units      Ref Range   Creatinine              62               mg/dL      >=20 ==================================================================== Declared Medications:  The flagging and interpretation on this  report are based on the  following declared medications.  Unexpected results may arise from  inaccuracies in the declared medications.  **Note: The testing scope of this panel includes these medications:  Aripiprazole  Duloxetine  Gabapentin  Metoprolol  **Note: The testing scope of this panel does not include small to  moderate amounts of these reported medications:  Acetaminophen  Aspirin (Aspirin 81)  **Note: The testing scope of this panel does not include following  reported medications:  Albuterol (Ipratropium-Albuterol)  Atorvastatin  Azelastine  Fluticasone  Fluticasone (Breo)  Ipratropium (Ipratropium-Albuterol)  Multivitamin (MVI)  Vilanterol (Breo) ==================================================================== For clinical consultation, please call [phone]. ====================================================================    Laboratory Chemistry Profile (12 mo)  Renal: 04/10/2018: BUN 19; BUN/Creatinine Ratio 19; Creat 1.00  Lab Results  Component Value Date   GFRAA 64 04/10/2018   GFRNONAA 55 (L) 04/10/2018   Hepatic: No results found for requested labs within last 8760 hours. Lab Results  Component Value Date   AST 16 04/10/2018   ALT 11 04/10/2018   Other: No results found for requested labs within last 8760 hours. Note: Above Lab results reviewed.  Assessment  The primary encounter diagnosis was Lumbar radiculopathy. Diagnoses of Chronic bilateral low back pain without sciatica, Lumbar degenerative disc disease, Right hip pain, and Chronic pain syndrome were also pertinent to this visit.  Plan of Care  I am having Beth [name] maintain her aspirin, acetaminophen, ARIPiprazole, clonazePAM, FLUoxetine, Melatonin, QUEtiapine, atorvastatin, fluticasone furoate-vilanterol, azelastine, loratadine, diclofenac sodium, Combivent Respimat, meloxicam, DULoxetine, DULoxetine, gabapentin, tiZANidine, and metoprolol succinate. We will continue to  administer albuterol.  Good benefit after lumbar epidural steroid injection #2 however patient notes that her pain is gradually returning in her right hip and radiating down her right leg.  Discussed repeating lumbar ESI #3.  Risks and benefits reviewed and patient would like to proceed.  Orders:  Orders Placed This Encounter  Procedures  . Lumbar Epidural Injection    Standing Status:   Future    Standing Expiration Date:   10/11/2018  Scheduling Instructions:     Procedure: Interlaminar Lumbar Epidural Steroid injection (LESI)            Laterality: Right L4/5 #3 w.o sedation     Timeframe: ASAA    Order Specific Question:   Where will this procedure be performed?    Answer:   [facility]   Follow-up plan:   Return in about 2 weeks (around 09/24/2018) for Procedure Right L4/5 #3 w.o sedation.    Status post bilateral facet medial branch nerve blocks L3, L4, L5 on 09/03/2017, helpful for axial low back and buttock pain: Repeat PRN.  Status post right L4-L5 epidural steroid injection on 03/09/2018, 07/29/2018 which was helpful for her right-sided hip and leg pain.  Repeat.    Recent Visits Date Type Provider Dept  07/29/18 Procedure visit [name], MD [facility]  Showing recent visits within past 90 days and meeting all other requirements   Today's Visits Date Type Provider Dept  09/10/18 Office Visit [name], MD [facility]  Showing today's visits and meeting all other requirements   Future Appointments No visits were found meeting these conditions.  Showing future appointments within next 90 days and meeting all other requirements   I discussed the assessment and treatment plan with the patient. The patient was provided an opportunity to ask questions and all were answered. The patient agreed with the plan and demonstrated an understanding of the instructions.  Patient advised to call back or seek an in-person evaluation if the symptoms or  condition worsens.  Total duration of non-face-to-face encounter: 15 minutes.  Note by: [name], MD Date: 09/10/2018; Time: 12:08 PM  Note: This dictation was prepared with Dragon dictation. Any transcriptional errors that may result from this process are unintentional.  Disclaimer:  * Given the special circumstances of the COVID-19 pandemic, the federal government has announced that the Office for Civil Rights (OCR) will exercise its enforcement discretion and will not impose penalties on physicians using telehealth in the event of noncompliance with regulatory requirements under the [place] (HIPAA) in connection with the good faith provision of telehealth during the LYYTK-35 national public health emergency. ([place])

## 2018-09-16 DIAGNOSIS — R29898 Other symptoms and signs involving the musculoskeletal system: Secondary | ICD-10-CM | POA: Diagnosis not present

## 2018-09-16 DIAGNOSIS — M542 Cervicalgia: Secondary | ICD-10-CM | POA: Diagnosis not present

## 2018-09-23 ENCOUNTER — Ambulatory Visit
Admission: RE | Admit: 2018-09-23 | Discharge: 2018-09-23 | Disposition: A | Discharge status: Home / Self Care | Payer: Medicare HMO | Source: Ambulatory Visit | Attending: Student in an Organized Health Care Education/Training Program | Admitting: Student in an Organized Health Care Education/Training Program

## 2018-09-23 ENCOUNTER — Other Ambulatory Visit: Payer: Self-pay

## 2018-09-23 ENCOUNTER — Encounter: Payer: Self-pay | Admitting: Student in an Organized Health Care Education/Training Program

## 2018-09-23 ENCOUNTER — Ambulatory Visit (HOSPITAL_BASED_OUTPATIENT_CLINIC_OR_DEPARTMENT_OTHER): Payer: Medicare HMO | Admitting: Student in an Organized Health Care Education/Training Program

## 2018-09-23 DIAGNOSIS — M5416 Radiculopathy, lumbar region: Secondary | ICD-10-CM | POA: Insufficient documentation

## 2018-09-23 MED ORDER — LIDOCAINE HCL 2 % IJ SOLN
20.0000 mL | Freq: Once | INTRAMUSCULAR | Status: AC
  Administered 2018-09-23: 400 mg
  Filled 2018-09-23: qty 20

## 2018-09-23 MED ORDER — DEXAMETHASONE SODIUM PHOSPHATE 10 MG/ML IJ SOLN
10.0000 mg | Freq: Once | INTRAMUSCULAR | Status: AC
  Administered 2018-09-23: 10 mg
  Filled 2018-09-23: qty 1

## 2018-09-23 MED ORDER — SODIUM CHLORIDE 0.9% FLUSH
1.0000 mL | Freq: Once | INTRAVENOUS | Status: AC
  Administered 2018-09-23: 1 mL

## 2018-09-23 MED ORDER — IOHEXOL 180 MG/ML  SOLN
10.0000 mL | Freq: Once | INTRAMUSCULAR | Status: AC
  Administered 2018-09-23: 10 mL via EPIDURAL

## 2018-09-23 MED ORDER — ROPIVACAINE HCL 2 MG/ML IJ SOLN
1.0000 mL | Freq: Once | INTRAMUSCULAR | Status: AC
  Administered 2018-09-23: 1 mL via EPIDURAL
  Filled 2018-09-23: qty 10

## 2018-09-23 MED ORDER — SODIUM CHLORIDE (PF) 0.9 % IJ SOLN
INTRAMUSCULAR | Status: AC
  Filled 2018-09-23: qty 10

## 2018-09-23 NOTE — Progress Notes (Signed)
Safety precautions to be maintained throughout the outpatient stay will include: orient to surroundings, keep bed in low position, maintain call bell within reach at all times, provide assistance with transfer out of bed and ambulation.  

## 2018-09-23 NOTE — Progress Notes (Signed)
Patient's Name: Beth [name]  MRN: [id number]  Referring Provider: [name], MD  DOB: [birth year]  PCP: Mikey College, NP  DOS: 09/23/2018  Note by: [name], MD  Service setting: Ambulatory outpatient  Specialty: Interventional Pain Management  Patient type: Established  Location: [facility] (AMB) Pain Management Facility  Visit type: Interventional Procedure   Primary Reason for Visit: Interventional Pain Management Treatment. CC: Back Pain (low)  Procedure:          Anesthesia, Analgesia, Anxiolysis:  Type: Therapeutic Inter-Laminar Epidural Steroid Injection  #3  Region: Lumbar Level: L4-5 Level. Laterality: Right-Sided         Type: Local Anesthesia Indication(s): Analgesia         Route: Infiltration (/IM) IV Access: Declined Sedation: Declined  Local Anesthetic: Lidocaine 1-2%  Position: Prone with head of the table was raised to facilitate breathing.   Indications: 1. Lumbar radiculopathy    Pain Score: Pre-procedure: 8 /10 Post-procedure: 0-No pain/10  Pre-op Assessment:  Beth [name] is a 73 y.o. (year old), female patient, seen today for interventional treatment. She  has a past surgical history that includes Breast surgery (2011); Cervical discectomy; Appendectomy; Abdominal hysterectomy; Cholecystectomy; and Sinusotomy. Beth [name] has a current medication list which includes the following prescription(s): acetaminophen, aripiprazole, aspirin, atorvastatin, azelastine, clonazepam, combivent respimat, diclofenac sodium, duloxetine, fluoxetine, fluticasone furoate-vilanterol, gabapentin, melatonin, meloxicam, metoprolol succinate, quetiapine, tizanidine, duloxetine, and loratadine, and the following Facility-Administered Medications: albuterol. Her primarily concern today is the Back Pain (low)  Initial Vital Signs:  Pulse/HCG Rate: 75ECG Heart Rate: 78 Temp: 98.4 F (36.9 C) Resp: 18 BP: 129/64 SpO2: 95 %  BMI: Estimated body mass index is 24.96  kg/m as calculated from the following:   Height as of this encounter: 5\' 5"  (1.651 m).   Weight as of this encounter: 150 lb (68 kg).  Risk Assessment: Allergies: Reviewed. She is allergic to bextra  [valdecoxib]; compazine [prochlorperazine edisylate]; lithium carbonate; and lyrica [pregabalin].  Allergy Precautions: None required Coagulopathies: Reviewed. None identified.  Blood-thinner therapy: None at this time Active Infection(s): Reviewed. None identified. Beth [name] is afebrile  Site Confirmation: Beth [name] was asked to confirm the procedure and laterality before marking the site Procedure checklist: Completed Consent: Before the procedure and under the influence of no sedative(s), amnesic(s), or anxiolytics, the patient was informed of the treatment options, risks and possible complications. To fulfill our ethical and legal obligations, as recommended by the American Medical Association's Code of Ethics, I have informed the patient of my clinical impression; the nature and purpose of the treatment or procedure; the risks, benefits, and possible complications of the intervention; the alternatives, including doing nothing; the risk(s) and benefit(s) of the alternative treatment(s) or procedure(s); and the risk(s) and benefit(s) of doing nothing. The patient was provided information about the general risks and possible complications associated with the procedure. These may include, but are not limited to: failure to achieve desired goals, infection, bleeding, organ or nerve damage, allergic reactions, paralysis, and death. In addition, the patient was informed of those risks and complications associated to Spine-related procedures, such as failure to decrease pain; infection (i.e.: Meningitis, epidural or intraspinal abscess); bleeding (i.e.: epidural hematoma, subarachnoid hemorrhage, or any other type of intraspinal or peri-dural bleeding); organ or nerve damage (i.e.: Any type of  peripheral nerve, nerve root, or spinal cord injury) with subsequent damage to sensory, motor, and/or autonomic systems, resulting in permanent pain, numbness, and/or weakness of one or several areas of the body; allergic  reactions; (i.e.: anaphylactic reaction); and/or death. Furthermore, the patient was informed of those risks and complications associated with the medications. These include, but are not limited to: allergic reactions (i.e.: anaphylactic or anaphylactoid reaction(s)); adrenal axis suppression; blood sugar elevation that in diabetics may result in ketoacidosis or comma; water retention that in patients with history of congestive heart failure may result in shortness of breath, pulmonary edema, and decompensation with resultant heart failure; weight gain; swelling or edema; medication-induced neural toxicity; particulate matter embolism and blood vessel occlusion with resultant organ, and/or nervous system infarction; and/or aseptic necrosis of one or more joints. Finally, the patient was informed that Medicine is not an exact science; therefore, there is also the possibility of unforeseen or unpredictable risks and/or possible complications that may result in a catastrophic outcome. The patient indicated having understood very clearly. We have given the patient no guarantees and we have made no promises. Enough time was given to the patient to ask questions, all of which were answered to the patient's satisfaction. Beth [name] has indicated that she wanted to continue with the procedure. Attestation: I, the ordering provider, attest that I have discussed with the patient the benefits, risks, side-effects, alternatives, likelihood of achieving goals, and potential problems during recovery for the procedure that I have provided informed consent. Date  Time: 09/23/2018 10:59 AM  Pre-Procedure Preparation:  Monitoring: As per clinic protocol. Respiration, ETCO2, SpO2, BP, heart rate and rhythm  monitor placed and checked for adequate function Safety Precautions: Patient was assessed for positional comfort and pressure points before starting the procedure. Time-out: I initiated and conducted the "Time-out" before starting the procedure, as per protocol. The patient was asked to participate by confirming the accuracy of the "Time Out" information. Verification of the correct person, site, and procedure were performed and confirmed by me, the nursing staff, and the patient. "Time-out" conducted as per Joint Commission's Universal Protocol (UP.01.01.01). Time: 1138  Description of Procedure:          Target Area: The interlaminar space, initially targeting the lower laminar border of the superior vertebral body. Approach: Paramedial approach. Area Prepped: Entire Posterior Lumbar Region Prepping solution: ChloraPrep (2% chlorhexidine gluconate and 70% isopropyl alcohol) Safety Precautions: Aspiration looking for blood return was conducted prior to all injections. At no point did we inject any substances, as a needle was being advanced. No attempts were made at seeking any paresthesias. Safe injection practices and needle disposal techniques used. Medications properly checked for expiration dates. SDV (single dose vial) medications used. Description of the Procedure: Protocol guidelines were followed. The procedure needle was introduced through the skin, ipsilateral to the reported pain, and advanced to the target area. Bone was contacted and the needle walked caudad, until the lamina was cleared. The epidural space was identified using "loss-of-resistance technique" with 2-3 ml of PF-NaCl (0.9% NSS), in a 5cc LOR glass syringe.  Vitals:   09/23/18 1134 09/23/18 1139 09/23/18 1144 09/23/18 1149  BP: 132/70 131/67 129/65 131/75  Pulse:      Resp: 15 17 15 12   Temp:      SpO2: 97% 96% 97% 97%  Weight:      Height:        Start Time: 1138 hrs. End Time: 1147 hrs.  Materials:  Needle(s)  Type: Epidural needle Gauge: 17G Length: 3.5-in Medication(s): Please see orders for medications and dosing details. 9 cc solution made of 6 cc of preservative-free saline, 2 cc of 0.2% ropivacaine, 1 cc of Decadron 10  mg/cc Imaging Guidance (Spinal):          Type of Imaging Technique: Fluoroscopy Guidance (Spinal) Indication(s): Assistance in needle guidance and placement for procedures requiring needle placement in or near specific anatomical locations not easily accessible without such assistance. Exposure Time: Please see nurses notes. Contrast: Before injecting any contrast, we confirmed that the patient did not have an allergy to iodine, shellfish, or radiological contrast. Once satisfactory needle placement was completed at the desired level, radiological contrast was injected. Contrast injected under live fluoroscopy. No contrast complications. See chart for type and volume of contrast used. Fluoroscopic Guidance: I was personally present during the use of fluoroscopy. "Tunnel Vision Technique" used to obtain the best possible view of the target area. Parallax error corrected before commencing the procedure. "Direction-depth-direction" technique used to introduce the needle under continuous pulsed fluoroscopy. Once target was reached, antero-posterior, oblique, and lateral fluoroscopic projection used confirm needle placement in all planes. Images permanently stored in EMR. Interpretation: I personally interpreted the imaging intraoperatively. Adequate needle placement confirmed in multiple planes. Appropriate spread of contrast into desired area was observed. No evidence of afferent or efferent intravascular uptake. No intrathecal or subarachnoid spread observed. Permanent images saved into the patient's record.  Antibiotic Prophylaxis:   Anti-infectives (From admission, onward)   None     Indication(s): None identified  Post-operative Assessment:  Post-procedure Vital Signs:   Pulse/HCG Rate: 7578 Temp: 98.4 F (36.9 C) Resp: 12 BP: 131/75 SpO2: 97 %  EBL: None  Complications: No immediate post-treatment complications observed by team, or reported by patient.  Note: The patient tolerated the entire procedure well. A repeat set of vitals were taken after the procedure and the patient was kept under observation following institutional policy, for this type of procedure. Post-procedural neurological assessment was performed, showing return to baseline, prior to discharge. The patient was provided with post-procedure discharge instructions, including a section on how to identify potential problems. Should any problems arise concerning this procedure, the patient was given instructions to immediately contact us, at any time, without hesitation. In any case, we plan to contact the patient by telephone for a follow-up status report regarding this interventional procedure.  Comments:  No additional relevant information.  Plan of Care    Imaging Orders     DG PAIN CLINIC C-ARM 1-60 MIN NO REPORT Procedure Orders    No procedure(s) ordered today    Medications ordered for procedure: Meds ordered this encounter  Medications  . iohexol (OMNIPAQUE) 180 MG/ML injection 10 mL    Must be Myelogram-compatible. If not available, you may substitute with a water-soluble, non-ionic, hypoallergenic, myelogram-compatible radiological contrast medium.  Marland Kitchen lidocaine (XYLOCAINE) 2 % (with pres) injection 400 mg  . dexamethasone (DECADRON) injection 10 mg  . ropivacaine (PF) 2 mg/mL (0.2%) (NAROPIN) injection 1 mL  . sodium chloride flush (NS) 0.9 % injection 1 mL   Medications administered: We administered iohexol, lidocaine, dexamethasone, ropivacaine (PF) 2 mg/mL (0.2%), and sodium chloride flush.  See the medical record for exact dosing, route, and time of administration.  Disposition: Discharge home  Discharge Date & Time: 09/23/2018; 1152 hrs.   Physician-requested  Follow-up: Return in about 6 weeks (around 11/04/2018) for Post Procedure Evaluation, virtual.  Future Appointments  Date Time Provider [place]  11/04/2018  2:15 PM [name], MD [facility] None   Primary Care Physician: Mikey College, NP Location: [facility] Outpatient Pain Management Facility Note by: [name], MD Date: 09/23/2018; Time: 12:33 PM  Disclaimer:  Medicine is not an Chief Strategy Officer. The only guarantee in medicine is that nothing is guaranteed. It is important to note that the decision to proceed with this intervention was based on the information collected from the patient. The Data and conclusions were drawn from the patient's questionnaire, the interview, and the physical examination. Because the information was provided in large part by the patient, it cannot be guaranteed that it has not been purposely or unconsciously manipulated. Every effort has been made to obtain as much relevant data as possible for this evaluation. It is important to note that the conclusions that lead to this procedure are derived in large part from the available data. Always take into account that the treatment will also be dependent on availability of resources and existing treatment guidelines, considered by other Pain Management Practitioners as being common knowledge and practice, at the time of the intervention. For Medico-Legal purposes, it is also important to point out that variation in procedural techniques and pharmacological choices are the acceptable norm. The indications, contraindications, technique, and results of the above procedure should only be interpreted and judged by a Board-Certified Interventional Pain Specialist with extensive familiarity and expertise in the same exact procedure and technique.

## 2018-09-23 NOTE — Patient Instructions (Signed)

## 2018-09-24 ENCOUNTER — Telehealth: Payer: Self-pay | Admitting: *Deleted

## 2018-09-24 NOTE — Telephone Encounter (Signed)
No problems post procedure. 

## 2018-09-27 ENCOUNTER — Other Ambulatory Visit: Payer: Self-pay | Admitting: Nurse Practitioner

## 2018-09-27 DIAGNOSIS — J449 Chronic obstructive pulmonary disease, unspecified: Secondary | ICD-10-CM

## 2018-10-09 ENCOUNTER — Ambulatory Visit (INDEPENDENT_AMBULATORY_CARE_PROVIDER_SITE_OTHER): Payer: Medicare HMO | Admitting: Family Medicine

## 2018-10-09 ENCOUNTER — Other Ambulatory Visit: Payer: Self-pay | Admitting: Nurse Practitioner

## 2018-10-09 ENCOUNTER — Other Ambulatory Visit: Payer: Self-pay

## 2018-10-09 ENCOUNTER — Encounter: Payer: Self-pay | Admitting: Family Medicine

## 2018-10-09 DIAGNOSIS — J302 Other seasonal allergic rhinitis: Secondary | ICD-10-CM

## 2018-10-09 DIAGNOSIS — J3089 Other allergic rhinitis: Secondary | ICD-10-CM

## 2018-10-09 DIAGNOSIS — I69359 Hemiplegia and hemiparesis following cerebral infarction affecting unspecified side: Secondary | ICD-10-CM

## 2018-10-09 DIAGNOSIS — E785 Hyperlipidemia, unspecified: Secondary | ICD-10-CM

## 2018-10-09 MED ORDER — MONTELUKAST SODIUM 10 MG PO TABS: 10.0000 mg | ORAL_TABLET | Freq: Every day | ORAL | 2 refills | Status: DC

## 2018-10-09 NOTE — Progress Notes (Signed)
Virtual Visit via Telephone The purpose of this virtual visit is to provide medical care while limiting exposure to the novel coronavirus (COVID19) for both patient and office staff.  Consent was obtained for phone visit:  Yes.   Answered questions that patient had about telehealth interaction:  Yes.   I discussed the limitations, risks, security and privacy concerns of performing an evaluation and management service by telephone. I also discussed with the patient that there may be a patient responsible charge related to this service. The patient expressed understanding and agreed to proceed.  Patient Location: Home Provider Location: [facility] (Office)  PCP is Cassell Smiles, AGPCNP-BC. Covering for while she is out of the office.  ---------------------------------------------------------------------- Chief Complaint  Patient presents with  . Sinus Problem    runny nose, nasal congestion denies cough or SOB or Fever    S: Reviewed CMA documentation. I have called patient and gathered additional HPI as follows:  CHRONIC ALLERGIES / SINUSITIS Reports that symptoms present for 1 year or more. Describes chronic allergies year round not seasonal but occasionally triggered by outdoor allergens. She has discussed this with PCP in past, last 03/2018, she has tried multiple anti histamine medications including claritin, zyrtec, also nasal sprays flonase and also most recently astelin nasal anti histamine without significant results. Chart review says on singulair in 2014 but she does not recall, last visit 03/2018 PCP recommended future consultation with ENT vs Allergist if not improving.  Denies any high risk travel to areas of current concern for COVID19. Denies any known or suspected exposure to person with or possibly with COVID19.  Admits sinus pressure Denies any fevers, chills, sweats, body ache, cough, shortness of breath, sinus pain , headache, abdominal pain, diarrhea,  purulent sinus drainage   Current Outpatient Medications:  .  acetaminophen (TYLENOL) 325 MG tablet, Take 650 mg by mouth every 4 (four) hours as needed., Disp: , Rfl:  .  ARIPiprazole (ABILIFY) 10 MG tablet, Take 10 mg by mouth at bedtime., Disp: , Rfl: 0 .  aspirin 81 MG chewable tablet, Chew 81 mg daily by mouth., Disp: , Rfl:  .  atorvastatin (LIPITOR) 40 MG tablet, TAKE 1 TABLET BY MOUTH ONCE DAILY, Disp: 90 tablet, Rfl: 1 .  azelastine (ASTELIN) 0.1 % nasal spray, Place 2 sprays into both nostrils 2 (two) times daily., Disp: 30 mL, Rfl: 2 .  clonazePAM (KLONOPIN) 0.5 MG tablet, TK 1 T PO DAILY PRN, Disp: , Rfl:  .  COMBIVENT RESPIMAT 20-100 MCG/ACT AERS respimat, INHALE 1 PUFF BY MOUTH EVERY 6 HOURS, Disp: 4 g, Rfl: 1 .  diclofenac sodium (VOLTAREN) 1 % GEL, Apply 2 g topically 3 (three) times daily as needed (use on elbow for arthritis pain)., Disp: 100 g, Rfl: 2 .  DULoxetine (CYMBALTA) 20 MG capsule, Take by mouth., Disp: , Rfl:  .  fluticasone furoate-vilanterol (BREO ELLIPTA) 100-25 MCG/INH AEPB, Inhale 1 puff into the lungs daily., Disp: 60 each, Rfl: 5 .  gabapentin (NEURONTIN) 300 MG capsule, Take 2 capsules (600 mg total) by mouth 3 (three) times daily., Disp: 180 capsule, Rfl: 3 .  loratadine (CLARITIN) 10 MG tablet, Take 1 tablet (10 mg total) by mouth daily., Disp: 30 tablet, Rfl: 11 .  Melatonin 3 MG CAPS, Take 12 mg by mouth at bedtime as needed (sleep)., Disp: , Rfl:  .  meloxicam (MOBIC) 7.5 MG tablet, Take 7.5 mg by mouth daily. , Disp: , Rfl:  .  metoprolol succinate (TOPROL-XL) 25  MG 24 hr tablet, Take 0.5 tablets (12.5 mg total) by mouth daily. Must have appointment prior to additional refills., Disp: 45 tablet, Rfl: 0 .  QUEtiapine (SEROQUEL) 25 MG tablet, Take 25 mg by mouth 3 (three) times daily as needed (anxiety)., Disp: , Rfl:  .  DULoxetine (CYMBALTA) 20 MG capsule, Take 2 capsules (40 mg total) by mouth daily. (Patient not taking: Reported on 10/09/2018), Disp: 60  capsule, Rfl: 3 .  FLUoxetine (PROZAC) 20 MG tablet, Take 20 mg by mouth daily., Disp: , Rfl:  .  montelukast (SINGULAIR) 10 MG tablet, Take 1 tablet (10 mg total) by mouth at bedtime., Disp: 30 tablet, Rfl: 2  Current Facility-Administered Medications:  .  albuterol (PROVENTIL) (2.5 MG/3ML) 0.083% nebulizer solution 2.5 mg, 2.5 mg, Nebulization, Once, [place], Bethel Born, NP  -------------------------------------------------------------------------- O: No physical exam performed due to remote telephone encounter.  -------------------------------------------------------------------------- A&P:  Chronic Allergies / Rhinosinusitis Based on current symptoms and chart review, timeline of symptoms >1 year, seems to be more chronic issue, not acute sinusitis at this time. No evidence based on history of bacterial infection.  - Reassuring without high risk symptoms - Afebrile, without dyspnea  1. Start Singulair 10mg  nightly for additional option for allergy relief, advised that can continue this longer term if helpful 2. She may continue or discontinue her astelin/flonase, or 2nd gen anti histamine - up to her, she says they are ineffective, advised that they are okay to take with singulair if she chooses to 3. Referral sent to [facility] for 2nd opinion, based on chronic allergic rhinosinusitis and previous recommendation by PCP  Defer empiric antibiotics at this time - advised call back criteria if not improving or significant worsening. Reconsider antibiotic in week in fever, purulence or worsening sinus pain  Orders Placed This Encounter  Procedures  . Ambulatory referral to ENT    Referral Priority:   Routine    Referral Type:   Consultation    Referral Reason:   Specialty Services Required    Requested Specialty:   Otolaryngology    Number of Visits Requested:   1     No orders of the defined types were placed in this encounter.  If symptoms do not resolve or significantly  improve OR if WORSENING - fever / cough - or worsening shortness of breath - then should contact us and seek advice on next steps in treatment at home vs where/when to seek care at Urgent Care or Hospital ED for further intervention and possible testing if indicated.  Patient verbalizes understanding with the above medical recommendations including the limitation of remote medical advice.  Specific follow-up / call-back criteria were given for patient to follow-up or seek medical care more urgently if needed.  - Time spent in direct consultation with patient on phone: 8 minutes   Nobie Putnam, [facility] 10/09/2018, 11:56 AM

## 2018-10-09 NOTE — Patient Instructions (Addendum)
Referral to ENT - stay tuned.  ENT  [facility] [address]  [place], [place] [address] Ph: [phone]   Please schedule a Follow-up Appointment to: Return if symptoms worsen or fail to improve, for allergy.  If you have any other questions or concerns, please feel free to call the office or send a message through [place]. You may also schedule an earlier appointment if necessary.  Additionally, you may be receiving a survey about your experience at our office within a few days to 1 week by e-mail or mail. We value your feedback.  Nobie Putnam, DO [place]

## 2018-10-15 DIAGNOSIS — R69 Illness, unspecified: Secondary | ICD-10-CM | POA: Diagnosis not present

## 2018-10-21 DIAGNOSIS — R69 Illness, unspecified: Secondary | ICD-10-CM | POA: Diagnosis not present

## 2018-10-22 DIAGNOSIS — J310 Chronic rhinitis: Secondary | ICD-10-CM | POA: Diagnosis not present

## 2018-10-22 DIAGNOSIS — J301 Allergic rhinitis due to pollen: Secondary | ICD-10-CM | POA: Diagnosis not present

## 2018-10-22 DIAGNOSIS — H9319 Tinnitus, unspecified ear: Secondary | ICD-10-CM | POA: Diagnosis not present

## 2018-10-26 ENCOUNTER — Telehealth: Payer: Self-pay

## 2018-10-26 NOTE — Telephone Encounter (Signed)
According to the records she should have enough to last her til 11/26/18

## 2018-10-26 NOTE — Telephone Encounter (Signed)
She wants to know if he will call out a refill of gabapentin

## 2018-10-26 NOTE — Telephone Encounter (Signed)
I spoke with pharmacist, she does have another script for Gabapentin, but cannot fill until 10-31-18.

## 2018-11-03 ENCOUNTER — Encounter: Payer: Self-pay | Admitting: Student in an Organized Health Care Education/Training Program

## 2018-11-03 ENCOUNTER — Other Ambulatory Visit: Payer: Self-pay | Admitting: Nurse Practitioner

## 2018-11-03 DIAGNOSIS — J449 Chronic obstructive pulmonary disease, unspecified: Secondary | ICD-10-CM

## 2018-11-04 ENCOUNTER — Other Ambulatory Visit: Payer: Self-pay

## 2018-11-04 ENCOUNTER — Encounter: Payer: Self-pay | Admitting: Student in an Organized Health Care Education/Training Program

## 2018-11-04 ENCOUNTER — Ambulatory Visit
Payer: Medicare HMO | Attending: Student in an Organized Health Care Education/Training Program | Admitting: Student in an Organized Health Care Education/Training Program

## 2018-11-04 DIAGNOSIS — G894 Chronic pain syndrome: Secondary | ICD-10-CM

## 2018-11-04 DIAGNOSIS — M5416 Radiculopathy, lumbar region: Secondary | ICD-10-CM

## 2018-11-04 DIAGNOSIS — G8929 Other chronic pain: Secondary | ICD-10-CM

## 2018-11-04 DIAGNOSIS — M545 Low back pain, unspecified: Secondary | ICD-10-CM

## 2018-11-04 DIAGNOSIS — M47816 Spondylosis without myelopathy or radiculopathy, lumbar region: Secondary | ICD-10-CM | POA: Diagnosis not present

## 2018-11-04 DIAGNOSIS — M5136 Other intervertebral disc degeneration, lumbar region: Secondary | ICD-10-CM | POA: Diagnosis not present

## 2018-11-04 DIAGNOSIS — M25551 Pain in right hip: Secondary | ICD-10-CM

## 2018-11-04 MED ORDER — GABAPENTIN 300 MG PO CAPS: 600.0000 mg | ORAL_CAPSULE | Freq: Three times a day (TID) | ORAL | 5 refills | Status: DC

## 2018-11-04 MED ORDER — DULOXETINE HCL 20 MG PO CPEP: 40.0000 mg | ORAL_CAPSULE | Freq: Every day | ORAL | 5 refills | Status: DC

## 2018-11-04 MED ORDER — PREDNISONE 20 MG PO TABS: ORAL_TABLET | ORAL | 0 refills | Status: AC

## 2018-11-04 NOTE — Progress Notes (Signed)
Pain Management Virtual Encounter Note - Virtual Visit via [place] (real-time audio visits between healthcare provider and patient).   Patient's Phone No. & Preferred Pharmacy:  [phone] (home); [phone] (mobile); (Preferred) [phone] No e-mail address on record  [facility] [address], [address] [place] [address] Phone: [phone] Fax: [phone]    Pre-screening note:  Our staff contacted Beth [name] and offered her an "in person", "face-to-face" appointment versus a telephone encounter. She indicated preferring the telephone encounter, at this time.   Reason for Virtual Visit: COVID-19*  Social distancing based on CDC and AMA recommendations.   I contacted Beth [name] on 11/04/2018 via video conference.      I clearly identified myself as [name], MD. I verified that I was speaking with the correct person using two identifiers (Name: Beth [name], and date of birth: 1945-10-15).  Advanced Informed Consent I sought verbal advanced consent from Beth [name] for virtual visit interactions. I informed Beth [name] of possible security and privacy concerns, risks, and limitations associated with providing "not-in-person" medical evaluation and management services. I also informed Beth [name] of the availability of "in-person" appointments. Finally, I informed her that there would be a charge for the virtual visit and that she could be  personally, fully or partially, financially responsible for it. Beth [name] expressed understanding and agreed to proceed.   Historic Elements   Beth [name] is a 73 y.o. year old, female patient evaluated today after her last encounter by our practice on 10/26/2018. Beth [name]  has a past medical history of Allergy, Anxiety, Asthma, Chronic pain syndrome, COPD (chronic obstructive pulmonary disease) ([place]), CVA  (cerebral infarction), Depression, Fibromyalgia, Headache, Hyperlipidemia, Hypertension, IBS (irritable bowel syndrome), Stroke ([place]), and Vitamin D deficiency. She also  has a past surgical history that includes Breast surgery (2011); Cervical discectomy; Appendectomy; Abdominal hysterectomy; Cholecystectomy; and Sinusotomy. Beth [name] has a current medication list which includes the following prescription(s): acetaminophen, aripiprazole, aspirin, atorvastatin, azelastine, clonazepam, combivent respimat, duloxetine, fluoxetine, gabapentin, loratadine, melatonin, meloxicam, metoprolol succinate, montelukast, quetiapine, breo ellipta, diclofenac sodium, duloxetine, and prednisone, and the following Facility-Administered Medications: albuterol. She  reports that she has been smoking cigarettes. She has a 26.25 pack-year smoking history. She uses smokeless tobacco. She reports that she does not drink alcohol or use drugs. Beth [name] is allergic to bextra  [valdecoxib]; compazine [prochlorperazine edisylate]; lithium carbonate; and lyrica [pregabalin].   HPI  Today, she is being contacted for both, medication management and a post-procedure assessment.   Patient follows up for postprocedural evaluation status post right L4-L5 ESI #3 performed on 09/23/2018.  She states that the procedure provided her with pain relief, approximately 60% for the first week and then return of pain thereafter.  She continues to have pain in her right hip that radiates down her right leg in a dermatomal fashion.  Denies any bowel bladder dysfunction.  Will hold off on repeating lumbar epidural steroid injection for the time being  Refill gabapentin and Cymbalta as below.  UDS:  Summary  Date Value Ref Range Status  02/24/2017 FINAL  Final    Comment:    ==================================================================== TOXASSURE COMP DRUG  ANALYSIS,UR ==================================================================== Test                             Result  Flag       Units Drug Present and Declared for Prescription Verification   Gabapentin                     PRESENT      EXPECTED   Aripiprazole                   PRESENT      EXPECTED   Metoprolol                     PRESENT      EXPECTED Drug Present not Declared for Prescription Verification   7-aminoclonazepam              368          UNEXPECTED ng/mg creat    7-aminoclonazepam is an expected metabolite of clonazepam. Source    of clonazepam is a scheduled prescription medication.   Citalopram                     PRESENT      UNEXPECTED   Desmethylcitalopram            PRESENT      UNEXPECTED    Desmethylcitalopram is an expected metabolite of citalopram or    the enantiomeric form, escitalopram.   Hydroxyzine                    PRESENT      UNEXPECTED Drug Absent but Declared for Prescription Verification   Duloxetine                     Not Detected UNEXPECTED   Acetaminophen                  Not Detected UNEXPECTED    Acetaminophen, as indicated in the declared medication list, is    not always detected even when used as directed.   Salicylate                     Not Detected UNEXPECTED    Aspirin, as indicated in the declared medication list, is not    always detected even when used as directed. ==================================================================== Test                      Result    Flag   Units      Ref Range   Creatinine              62               mg/dL      >=20 ==================================================================== Declared Medications:  The flagging and interpretation on this report are based on the  following declared medications.  Unexpected results may arise from  inaccuracies in the declared medications.  **Note: The testing scope of this panel includes these medications:  Aripiprazole  Duloxetine   Gabapentin  Metoprolol  **Note: The testing scope of this panel does not include small to  moderate amounts of these reported medications:  Acetaminophen  Aspirin (Aspirin 81)  **Note: The testing scope of this panel does not include following  reported medications:  Albuterol (Ipratropium-Albuterol)  Atorvastatin  Azelastine  Fluticasone  Fluticasone (Breo)  Ipratropium (Ipratropium-Albuterol)  Multivitamin (MVI)  Vilanterol (Breo) ==================================================================== For clinical consultation, please call [phone]. ====================================================================    Laboratory Chemistry Profile (12 mo)  Renal: 04/10/2018: BUN 19; BUN/Creatinine Ratio 19;  Creat 1.00  Lab Results  Component Value Date   GFRAA 64 04/10/2018   GFRNONAA 55 (L) 04/10/2018   Hepatic: No results found for requested labs within last 8760 hours. Lab Results  Component Value Date   AST 16 04/10/2018   ALT 11 04/10/2018   Other: No results found for requested labs within last 8760 hours. Note: Above Lab results reviewed.   Assessment  The primary encounter diagnosis was Lumbar radiculopathy. Diagnoses of Chronic bilateral low back pain without sciatica, Lumbar degenerative disc disease, Lumbar facet arthropathy, Right hip pain, and Chronic pain syndrome were also pertinent to this visit.  Plan of Care  I am having Cresenciano Lick. Reindl start on predniSONE. I am also having her maintain her aspirin, acetaminophen, ARIPiprazole, clonazePAM, FLUoxetine, Melatonin, QUEtiapine, azelastine, loratadine, diclofenac sodium, Combivent Respimat, meloxicam, DULoxetine, metoprolol succinate, montelukast, atorvastatin, gabapentin, and DULoxetine. We will continue to administer albuterol.  Pharmacotherapy (Medications Ordered): Meds ordered this encounter  Medications  . gabapentin (NEURONTIN) 300 MG capsule    Sig: Take 2 capsules (600 mg total) by mouth 3  (three) times daily.    Dispense:  180 capsule    Refill:  5  . DULoxetine (CYMBALTA) 20 MG capsule    Sig: Take 2 capsules (40 mg total) by mouth daily.    Dispense:  60 capsule    Refill:  5  . predniSONE (DELTASONE) 20 MG tablet    Sig: Take 3 tablets (60 mg total) by mouth daily with breakfast for 3 days, THEN 2 tablets (40 mg total) daily with breakfast for 3 days, THEN 1 tablet (20 mg total) daily with breakfast for 3 days.    Dispense:  18 tablet    Refill:  0   Follow-up plan:   Return in about 6 months (around 05/05/2019) for Medication Management.     Status post bilateral facet medial branch nerve blocks L3, L4, L5 on 09/03/2017, helpful for axial low back and buttock pain: Repeat PRN.  Status post right L4-L5 epidural steroid injection on 03/09/2018, 07/29/2018 which was helpful for her right-sided hip and leg pain.  Right L4-L5 ESI #3 on 09/23/2027 not as helpful.    Recent Visits Date Type Provider Dept  09/23/18 Procedure visit [name], MD [facility]  09/10/18 Office Visit [name], MD [facility]  Showing recent visits within past 90 days and meeting all other requirements   Today's Visits Date Type Provider Dept  11/04/18 Office Visit [name], MD [facility]  Showing today's visits and meeting all other requirements   Future Appointments No visits were found meeting these conditions.  Showing future appointments within next 90 days and meeting all other requirements   I discussed the assessment and treatment plan with the patient. The patient was provided an opportunity to ask questions and all were answered. The patient agreed with the plan and demonstrated an understanding of the instructions.  Patient advised to call back or seek an in-person evaluation if the symptoms or condition worsens.  Total duration of non-face-to-face encounter: 15 minutes.  Note by: [name], MD Date: 11/04/2018; Time: 2:10 PM  Note: This  dictation was prepared with Dragon dictation. Any transcriptional errors that may result from this process are unintentional.  Disclaimer:  * Given the special circumstances of the COVID-19 pandemic, the federal government has announced that the Office for Civil Rights (OCR) will exercise its enforcement discretion and will not impose penalties on physicians using telehealth in the  event of noncompliance with regulatory requirements under the [employer] and Accountability Act (HIPAA) in connection with the good faith provision of telehealth during the IXVEZ-50 national public health emergency. (AMA)

## 2018-11-27 ENCOUNTER — Other Ambulatory Visit: Payer: Self-pay | Admitting: Family Medicine

## 2018-11-27 DIAGNOSIS — Z1231 Encounter for screening mammogram for malignant neoplasm of breast: Secondary | ICD-10-CM

## 2018-12-12 ENCOUNTER — Other Ambulatory Visit: Payer: Self-pay | Admitting: Nurse Practitioner

## 2018-12-12 DIAGNOSIS — I10 Essential (primary) hypertension: Secondary | ICD-10-CM

## 2018-12-15 DIAGNOSIS — R69 Illness, unspecified: Secondary | ICD-10-CM | POA: Diagnosis not present

## 2018-12-15 DIAGNOSIS — G4700 Insomnia, unspecified: Secondary | ICD-10-CM | POA: Diagnosis not present

## 2018-12-15 DIAGNOSIS — F339 Major depressive disorder, recurrent, unspecified: Secondary | ICD-10-CM | POA: Diagnosis not present

## 2018-12-28 ENCOUNTER — Other Ambulatory Visit: Payer: Self-pay

## 2018-12-28 ENCOUNTER — Ambulatory Visit
Admission: EM | Admit: 2018-12-28 | Discharge: 2018-12-28 | Disposition: A | Discharge status: Home / Self Care | Payer: Medicare HMO | Attending: Family Medicine | Admitting: Family Medicine

## 2018-12-28 ENCOUNTER — Ambulatory Visit (INDEPENDENT_AMBULATORY_CARE_PROVIDER_SITE_OTHER): Payer: Medicare HMO

## 2018-12-28 DIAGNOSIS — J441 Chronic obstructive pulmonary disease with (acute) exacerbation: Secondary | ICD-10-CM | POA: Diagnosis not present

## 2018-12-28 DIAGNOSIS — R0981 Nasal congestion: Secondary | ICD-10-CM | POA: Diagnosis not present

## 2018-12-28 DIAGNOSIS — J029 Acute pharyngitis, unspecified: Secondary | ICD-10-CM

## 2018-12-28 DIAGNOSIS — R69 Illness, unspecified: Secondary | ICD-10-CM | POA: Diagnosis not present

## 2018-12-28 DIAGNOSIS — R05 Cough: Secondary | ICD-10-CM

## 2018-12-28 DIAGNOSIS — F17210 Nicotine dependence, cigarettes, uncomplicated: Secondary | ICD-10-CM

## 2018-12-28 LAB — RAPID STREP SCREEN (MED CTR MEBANE ONLY): Streptococcus, Group A Screen (Direct): NEGATIVE

## 2018-12-28 LAB — SARS CORONAVIRUS 2 AG (30 MIN TAT): SARS Coronavirus 2 Ag: NEGATIVE

## 2018-12-28 MED ORDER — ALBUTEROL SULFATE HFA 108 (90 BASE) MCG/ACT IN AERS: 1.0000 | INHALATION_SPRAY | Freq: Four times a day (QID) | RESPIRATORY_TRACT | 0 refills | Status: DC | PRN

## 2018-12-28 MED ORDER — BENZONATATE 200 MG PO CAPS: ORAL_CAPSULE | ORAL | 0 refills | Status: DC

## 2018-12-28 MED ORDER — PREDNISONE 20 MG PO TABS: ORAL_TABLET | ORAL | 0 refills | Status: DC

## 2018-12-28 MED ORDER — DOXYCYCLINE HYCLATE 100 MG PO CAPS: 100.0000 mg | ORAL_CAPSULE | Freq: Every day | ORAL | 0 refills | Status: DC

## 2018-12-28 NOTE — Discharge Instructions (Signed)
If your symptoms worsen especially with any shortness of breath or increased coughing go immediately to the emergency room

## 2018-12-28 NOTE — ED Triage Notes (Signed)
Pt. Is here with a sore throat, runny nose, nasal congestion, severe body aches since Sat.

## 2018-12-28 NOTE — ED Provider Notes (Signed)
[facility]    CSN: [id number] Arrival date & time: 12/28/18  1312      History   Chief Complaint Chief Complaint  Patient presents with  . Cough    HPI Beth [name] is a 73 y.o. female.   HPI  72 year old female with multiple comorbidities presents with a sore throat, runny nose, nasal congestion ,severe body aches that she has had since Saturday.  She has not had any fever or chills.  Patient appears ill but not toxic.  She has a history of COPD but does not remember any previous O2 sats.  She denies being around anyone that has had the symptoms.  He admits that she does frequently go to the grocery store.  She further admits to smoking several cigarettes on a daily basis.        Past Medical History:  Diagnosis Date  . Allergy   . Anxiety   . Asthma   . Chronic pain syndrome    discharged from pain clinic, hx of narcotics seeking behavior  . COPD (chronic obstructive pulmonary disease) ([place])   . CVA (cerebral infarction)   . Depression   . Fibromyalgia   . Headache   . Hyperlipidemia   . Hypertension   . IBS (irritable bowel syndrome)   . Stroke ([place])   . Vitamin D deficiency     Patient Active Problem List   Diagnosis Date Noted  . Lumbar degenerative disc disease 09/10/2018  . Right hip pain 09/10/2018  . Post-traumatic osteoarthritis of left elbow 07/14/2018  . Lumbar radiculopathy 02/12/2018  . Lumbar spondylosis 02/12/2018  . Lumbar facet arthropathy 02/12/2018  . Atherosclerosis of aorta ([place]) 09/17/2017  . Lung nodule 09/17/2017  . Urinary tract infection 12/05/2016  . Protein-calorie malnutrition, mild ([place]) 08/07/2016  . Generalized anxiety disorder 07/15/2016  . Chronic pain syndrome 07/15/2016  . Severe episode of recurrent major depressive disorder, without psychotic features ([place]) 07/14/2016  . Moderate benzodiazepine use disorder ([place]) 05/08/2016  . Major depressive disorder, recurrent, severe w/o psychotic behavior  ([place]) 05/01/2016  . Seasonal allergic rhinitis 03/23/2015  . Hyperglycemia 03/23/2015  . Senile purpura ([place]) 03/23/2015  . Perennial allergic rhinitis with seasonal variation 03/23/2015  . Marital problems 10/31/2014  . Migraine without aura and without status migrainosus, not intractable 10/31/2014  . Chronic bilateral low back pain without sciatica 08/09/2014  . Colon polyp 08/09/2014  . COPD, severe ([place]) 08/09/2014  . CVA, old, hemiparesis ([place]) 08/09/2014  . Dyslipidemia 08/09/2014  . Dysfunction of eustachian tube 08/09/2014  . Fibromyalgia syndrome 08/09/2014  . Gastro-esophageal reflux disease without esophagitis 08/09/2014  . Benign migrating glossitis 08/09/2014  . Cerebrovascular accident, old 08/09/2014  . IBS (irritable bowel syndrome) 08/09/2014  . Low back pain with radiation 08/09/2014  . Chronic recurrent major depressive disorder ([place]) 08/09/2014  . Dysmetabolic syndrome [id number]  . OP (osteoporosis) 08/09/2014  . Vitamin D deficiency 08/09/2014  . Benign hypertension 07/19/2013  . Benign neoplasm of skin of trunk 06/03/2013  . H/O: pneumonia 09/25/2012    Past Surgical History:  Procedure Laterality Date  . ABDOMINAL HYSTERECTOMY    . APPENDECTOMY    . BREAST SURGERY  2011   biopsy  . CERVICAL DISCECTOMY    . CHOLECYSTECTOMY    . SINUSOTOMY      OB History   No obstetric history on file.      Home Medications    Prior to Admission medications   Medication Sig Start Date End Date  Taking? Authorizing Provider  clonazePAM (KLONOPIN) 0.5 MG tablet TK 1 T PO DAILY PRN 04/01/18  Yes [provider]  acetaminophen (TYLENOL) 325 MG tablet Take 650 mg by mouth every 4 (four) hours as needed.    [provider]  albuterol (VENTOLIN HFA) 108 (90 Base) MCG/ACT inhaler Inhale 1-2 puffs into the lungs every 6 (six) hours as needed for wheezing or shortness of breath. Use with spacer 12/28/18   Crecencio Mc P, PA-C  ARIPiprazole (ABILIFY)  10 MG tablet Take 10 mg by mouth at bedtime. 07/10/17   [provider]  aspirin 81 MG chewable tablet Chew 81 mg daily by mouth.    [provider]  atorvastatin (LIPITOR) 40 MG tablet TAKE 1 TABLET BY MOUTH EVERY DAY 10/09/18   Karamalegos, [name], DO  azelastine (ASTELIN) 0.1 % nasal spray Place 2 sprays into both nostrils 2 (two) times daily. 04/10/18   Mikey College, NP  benzonatate (TESSALON) 200 MG capsule Take one cap TID PRN cough 12/28/18   Crecencio Mc P, PA-C  BREO ELLIPTA 100-25 MCG/INH AEPB INHALE 1 PUFF INTO THE LUNGS DAILY 11/03/18   Mikey College, NP  COMBIVENT RESPIMAT 20-100 MCG/ACT AERS respimat INHALE 1 PUFF BY MOUTH EVERY 6 HOURS 07/22/18   Karamalegos, [name], DO  doxycycline (VIBRAMYCIN) 100 MG capsule Take 1 capsule (100 mg total) by mouth daily. 12/28/18   Lorin Picket, PA-C  DULoxetine (CYMBALTA) 20 MG capsule Take by mouth. 03/09/18   [provider]  DULoxetine (CYMBALTA) 20 MG capsule Take 2 capsules (40 mg total) by mouth daily. 11/04/18   [name], MD  FLUoxetine (PROZAC) 20 MG tablet Take 20 mg by mouth daily.    [provider]  gabapentin (NEURONTIN) 300 MG capsule Take 2 capsules (600 mg total) by mouth 3 (three) times daily. 11/04/18 05/03/19  [name], MD  loratadine (CLARITIN) 10 MG tablet Take 1 tablet (10 mg total) by mouth daily. 04/10/18   Mikey College, NP  Melatonin 3 MG CAPS Take 12 mg by mouth at bedtime as needed (sleep).    [provider]  meloxicam (MOBIC) 7.5 MG tablet Take 7.5 mg by mouth daily.  07/28/18   [provider]  metoprolol succinate (TOPROL-XL) 25 MG 24 hr tablet TAKE 1/2 TABLET(12.5 MG) BY MOUTH DAILY 12/14/18   Karamalegos, [name], DO  montelukast (SINGULAIR) 10 MG tablet Take 1 tablet (10 mg total) by mouth at bedtime. 10/09/18   Karamalegos, [name], DO  predniSONE (DELTASONE) 20 MG tablet Take 2 tablets (40 mg) daily by mouth  12/28/18   Lorin Picket, PA-C  QUEtiapine (SEROQUEL) 25 MG tablet Take 25 mg by mouth 3 (three) times daily as needed (anxiety).    [provider]    Family History Family History  Problem Relation Age of Onset  . Anxiety disorder Mother   . Depression Mother   . Cancer Father   . Gallbladder disease Father   . Alcohol abuse Father   . Depression Father     Social History Social History   Tobacco Use  . Smoking status: Current Some Day Smoker    Packs/day: 0.75    Years: 35.00    Pack years: 26.25    Types: Cigarettes  . Smokeless tobacco: Current User  Substance Use Topics  . Alcohol use: No    Alcohol/week: 0.0 standard drinks  . Drug use: No     Allergies   Bextra  [  valdecoxib], Compazine [prochlorperazine edisylate], Lithium carbonate, and Lyrica [pregabalin]   Review of Systems Review of Systems  Constitutional: Positive for activity change and fatigue. Negative for appetite change, chills and fever.  HENT: Positive for congestion, rhinorrhea and sore throat.   All other systems reviewed and are negative.    Physical Exam Triage Vital Signs ED Triage Vitals  Enc Vitals Group     BP 12/28/18 1450 (!) 144/76     Pulse Rate 12/28/18 1450 79     Resp 12/28/18 1450 17     Temp 12/28/18 1450 98.1 F (36.7 C)     Temp Source 12/28/18 1450 Oral     SpO2 12/28/18 1450 96 %     Weight 12/28/18 1447 150 lb (68 kg)     Height --      Head Circumference --      Peak Flow --      Pain Score 12/28/18 1447 9     Pain Loc --      Pain Edu? --      Excl. in [place]? --    No data found.  Updated Vital Signs BP (!) 144/76 (BP Location: Left Arm)   Pulse 79   Temp 98.1 F (36.7 C) (Oral)   Resp 17   Wt 150 lb (68 kg)   SpO2 96%   BMI 24.96 kg/m   Visual Acuity Right Eye Distance:   Left Eye Distance:   Bilateral Distance:    Right Eye Near:   Left Eye Near:    Bilateral Near:     Physical Exam Vitals signs and nursing note reviewed.   Constitutional:      General: She is not in acute distress.    Appearance: Normal appearance. She is normal weight. She is ill-appearing. She is not toxic-appearing.  HENT:     Head: Normocephalic and atraumatic.     Nose: Rhinorrhea present.  Eyes:     Conjunctiva/sclera: Conjunctivae normal.  Neck:     Musculoskeletal: Normal range of motion and neck supple.  Cardiovascular:     Rate and Rhythm: Normal rate and regular rhythm.     Heart sounds: Normal heart sounds.  Pulmonary:     Effort: Pulmonary effort is normal.     Breath sounds: Wheezing present.     Comments: Patient has wheezing throughout all lung fields.  No crackles are appreciated. Musculoskeletal: Normal range of motion.  Skin:    General: Skin is warm and dry.  Neurological:     General: No focal deficit present.     Mental Status: She is alert and oriented to person, place, and time.  Psychiatric:        Mood and Affect: Mood normal.        Behavior: Behavior normal.        Thought Content: Thought content normal.        Judgment: Judgment normal.      UC Treatments / Results  Labs (all labs ordered are listed, but only abnormal results are displayed) Labs Reviewed  RAPID STREP SCREEN ([facility] ONLY)  SARS CORONAVIRUS 2 AG (30 MIN TAT)  NOVEL CORONAVIRUS, NAA (HOSP ORDER, SEND-OUT TO REF LAB; TAT 18-24 HRS)  CULTURE, GROUP A STREP [facility])    EKG   Radiology Dg Chest 2 View  Result Date: 12/28/2018 CLINICAL DATA:  73 year old female with cough. EXAM: CHEST - 2 VIEW COMPARISON:  Chest radiograph dated 09/08/2017. FINDINGS: There is emphysematous changes of the lungs.  No focal consolidation, pleural effusion, or pneumothorax. Faint 9 mm nodular density in the right mid lung field correspond to the nodule seen on the prior CT of 09/17/2017. Further evaluation with CT as per recommendation of the prior CT is advised. The cardiac silhouette is within normal limits. No acute osseous pathology. Lower  thoracic old compression fracture with anterior wedging. Cervical ACDF. IMPRESSION: 1. No acute cardiopulmonary process. 2. Emphysema. 3. Faint 9 mm nodular density in the right mid lung field corresponds to the nodule seen on the prior CT. Follow-up as per recommendation of the prior CT. Electronically Signed   By: Anner Crete M.D.   On: 12/28/2018 15:37    Procedures Procedures (including critical care time)  Medications Ordered in UC Medications - No data to display  Initial Impression / Assessment and Plan / UC Course  I have reviewed the triage vital signs and the nursing notes.  Pertinent labs & imaging results that were available during my care of the patient were reviewed by me and considered in my medical decision making (see chart for details).   73 year old female presents with sore throat, runny nose, nasal congestion, severe body aches and coughing since Saturday.  She has a history of COPD and continues to smoke  a limited amount.  Her rapid test was negative.  We will submit a PCR test for confirmation.  X-ray showed emphysematous changes but no consolidations or effusions.  Test was also negative.  2 sats were 96% she was afebrile pulse rate of 79 respirations of 17.  At this time I will treat her for COPD exacerbation.  Recommended that she follow-up with her primary care physician at the end of the week or next week if she is not improving.  He was given parameters for going to the emergency room if she begins to deteriorate.    Final Clinical Impressions(s) / UC Diagnoses   Final diagnoses:  COPD exacerbation [facility])     Discharge Instructions     If your symptoms worsen especially with any shortness of breath or increased coughing go immediately to the emergency room    ED Prescriptions    Medication Sig Dispense Auth. Provider   doxycycline (VIBRAMYCIN) 100 MG capsule Take 1 capsule (100 mg total) by mouth daily. 5 capsule Crecencio Mc P, PA-C   predniSONE  (DELTASONE) 20 MG tablet Take 2 tablets (40 mg) daily by mouth 8 tablet Crecencio Mc P, PA-C   benzonatate (TESSALON) 200 MG capsule Take one cap TID PRN cough 30 capsule Crecencio Mc P, PA-C   albuterol (VENTOLIN HFA) 108 (90 Base) MCG/ACT inhaler Inhale 1-2 puffs into the lungs every 6 (six) hours as needed for wheezing or shortness of breath. Use with spacer 8 g Lorin Picket, PA-C     PDMP not reviewed this encounter.   Lorin Picket, PA-C 12/28/18 2010

## 2018-12-30 LAB — NOVEL CORONAVIRUS, NAA (HOSP ORDER, SEND-OUT TO REF LAB; TAT 18-24 HRS): SARS-CoV-2, NAA: NOT DETECTED

## 2018-12-31 LAB — CULTURE, GROUP A STREP (THRC)

## 2019-01-04 ENCOUNTER — Other Ambulatory Visit: Payer: Self-pay | Admitting: Family Medicine

## 2019-01-04 DIAGNOSIS — J302 Other seasonal allergic rhinitis: Secondary | ICD-10-CM

## 2019-01-04 DIAGNOSIS — J3089 Other allergic rhinitis: Secondary | ICD-10-CM

## 2019-01-04 DIAGNOSIS — E785 Hyperlipidemia, unspecified: Secondary | ICD-10-CM

## 2019-01-04 DIAGNOSIS — J449 Chronic obstructive pulmonary disease, unspecified: Secondary | ICD-10-CM

## 2019-01-04 DIAGNOSIS — I69359 Hemiplegia and hemiparesis following cerebral infarction affecting unspecified side: Secondary | ICD-10-CM

## 2019-01-04 NOTE — Telephone Encounter (Signed)
Requested medication (s) are due for refill today: yes  Requested medication (s) are on the active medication list: yes  Last refill:  01/16/2018  Future visit scheduled:yes  Notes to clinic:  Review for refill   Requested Prescriptions  Pending Prescriptions Disp Refills   atorvastatin (LIPITOR) 40 MG tablet [Pharmacy Med Name: ATORVASTATIN 40MG  TABLETS] 90 tablet 0    Sig: TAKE 1 TABLET BY MOUTH DAILY     Cardiovascular:  Antilipid - Statins Failed - 01/04/2019 11:52 AM      Failed - Valid encounter within last 12 months    Recent Outpatient Visits          1 year ago Shortness of breath   [place], NP   1 year ago CVA, old, hemiparesis ([place])   [place], [place], NP   1 year ago Slurred speech   [place], NP   1 year ago Benign hypertension   [facility] [place], Drue Stager, MD   1 year ago COPD with exacerbation [facility])   [facility] [place], Drue Stager, MD             Passed - Total Cholesterol in normal range and within 360 days    Cholesterol, Total  Date Value Ref Range Status  03/23/2015 118 100 - 199 mg/dL Final   Cholesterol  Date Value Ref Range Status  04/10/2018 135 <200 mg/dL Final  12/25/2012 185 0 - 200 mg/dL Final         Passed - LDL in normal range and within 360 days    Ldl Cholesterol, Calc  Date Value Ref Range Status  12/25/2012 127 (H) 0 - 100 mg/dL Final   LDL Cholesterol (Calc)  Date Value Ref Range Status  04/10/2018 64 mg/dL (calc) Final    Comment:    Reference range: <100 . Desirable range <100 mg/dL for primary prevention;   <70 mg/dL for patients with CHD or diabetic patients  with > or = 2 CHD risk factors. Marland Kitchen LDL-C is now calculated using the Martin-Hopkins  calculation, which is a validated novel method providing  better accuracy than the Friedewald equation in the   estimation of LDL-C.  Cresenciano Genre et al. Annamaria Helling. [id number]): 2061-2068  (http://education.[facility].com/faq/FAQ164)          Passed - HDL in normal range and within 360 days    HDL Cholesterol  Date Value Ref Range Status  12/25/2012 39 (L) 40 - 60 mg/dL Final   HDL  Date Value Ref Range Status  04/10/2018 59 > OR = 50 mg/dL Final  03/23/2015 44 >39 mg/dL Final         Passed - Triglycerides in normal range and within 360 days    Triglycerides  Date Value Ref Range Status  04/10/2018 50 <150 mg/dL Final  12/25/2012 93 0 - 200 mg/dL Final         Passed - Patient is not pregnant       atorvastatin (LIPITOR) 40 MG tablet [Pharmacy Med Name: ATORVASTATIN 40MG  TABLETS] 90 tablet 0    Sig: TAKE 1 TABLET BY MOUTH DAILY     Cardiovascular:  Antilipid - Statins Failed - 01/04/2019 11:52 AM      Failed - Valid encounter within last 12 months    Recent Outpatient Visits          1 year ago Shortness of breath   [facility]  [place], NP   1 year ago CVA, old, hemiparesis [facility])   [place], NP   1 year ago Slurred speech   [place], NP   1 year ago Benign hypertension   [facility] [place], Drue Stager, MD   1 year ago COPD with exacerbation [facility])   [facility] [place] S[place], MD             Passed - Total Cholesterol in normal range and within 360 days    Cholesterol, Total  Date Value Ref Range Status  03/23/2015 118 100 - 199 mg/dL Final   Cholesterol  Date Value Ref Range Status  04/10/2018 135 <200 mg/dL Final  12/25/2012 185 0 - 200 mg/dL Final         Passed - LDL in normal range and within 360 days    Ldl Cholesterol, Calc  Date Value Ref Range Status  12/25/2012 127 (H) 0 - 100 mg/dL Final   LDL Cholesterol (Calc)  Date Value Ref Range Status  04/10/2018 64 mg/dL (calc) Final    Comment:     Reference range: <100 . Desirable range <100 mg/dL for primary prevention;   <70 mg/dL for patients with CHD or diabetic patients  with > or = 2 CHD risk factors. Marland Kitchen LDL-C is now calculated using the Martin-Hopkins  calculation, which is a validated novel method providing  better accuracy than the Friedewald equation in the  estimation of LDL-C.  Cresenciano Genre et al. Annamaria Helling. [id number]): 2061-2068  (http://education.[facility].com/faq/FAQ164)          Passed - HDL in normal range and within 360 days    HDL Cholesterol  Date Value Ref Range Status  12/25/2012 39 (L) 40 - 60 mg/dL Final   HDL  Date Value Ref Range Status  04/10/2018 59 > OR = 50 mg/dL Final  03/23/2015 44 >39 mg/dL Final         Passed - Triglycerides in normal range and within 360 days    Triglycerides  Date Value Ref Range Status  04/10/2018 50 <150 mg/dL Final  12/25/2012 93 0 - 200 mg/dL Final         Passed - Patient is not pregnant

## 2019-01-11 ENCOUNTER — Encounter: Payer: Self-pay | Admitting: Family Medicine

## 2019-01-11 ENCOUNTER — Ambulatory Visit (INDEPENDENT_AMBULATORY_CARE_PROVIDER_SITE_OTHER): Payer: Medicare HMO | Admitting: Family Medicine

## 2019-01-11 ENCOUNTER — Other Ambulatory Visit: Payer: Self-pay

## 2019-01-11 DIAGNOSIS — J441 Chronic obstructive pulmonary disease with (acute) exacerbation: Secondary | ICD-10-CM | POA: Diagnosis not present

## 2019-01-11 MED ORDER — PREDNISONE 20 MG PO TABS: ORAL_TABLET | ORAL | 0 refills | Status: DC

## 2019-01-11 MED ORDER — LEVOFLOXACIN 500 MG PO TABS: 500.0000 mg | ORAL_TABLET | Freq: Every day | ORAL | 0 refills | Status: DC

## 2019-01-11 NOTE — Progress Notes (Signed)
Virtual Visit via Telephone The purpose of this virtual visit is to provide medical care while limiting exposure to the novel coronavirus (COVID19) for both patient and office staff.  Consent was obtained for phone visit:  Yes.   Answered questions that patient had about telehealth interaction:  Yes.   I discussed the limitations, risks, security and privacy concerns of performing an evaluation and management service by telephone. I also discussed with the patient that there may be a patient responsible charge related to this service. The patient expressed understanding and agreed to proceed.  Patient Location: Home Provider Location: [facility])  ---------------------------------------------------------------------- Chief Complaint  Patient presents with  . COPD    S: Reviewed CMA documentation. I have called patient and gathered additional HPI as follows:  Urgent Care/UC FOLLOW-UP VISIT  Hospital/Location: [facility] Date of UC Visit: 12/28/18  Reason for Presenting to ED: COPD exacerbation  FOLLOW-UP  - UC provider note and record have been reviewed - Patient presents today about 14 days after recent UC visit. Brief summary of recent course, patient had symptoms of cough dyspnea, testing in UC with X-ray labs, treated with Doxycycline, Prednisone, Tessalon, Albuterol.  COVID19 test negative X-ray reviewed no acute findings, see below  - Today reports overall has improved after that visit but only for about 5-7 days then worsening again now over past 1 week. Says steroid medicine helped but then wore off and less effective. Using Albuterol and her breo inhaler / combivent  Denies any high risk travel to areas of current concern for COVID19. Denies any known or suspected exposure to person with or possibly with COVID19.  Admits dyspnea, cough, sinus drainage Denies any fevers, chills, sweats, body ache, headache, abdominal pain, diarrhea  Past  Medical History:  Diagnosis Date  . Allergy   . Anxiety   . Asthma   . Chronic pain syndrome    discharged from pain clinic, hx of narcotics seeking behavior  . COPD (chronic obstructive pulmonary disease) ([place])   . CVA (cerebral infarction)   . Depression   . Fibromyalgia   . Headache   . Hyperlipidemia   . Hypertension   . IBS (irritable bowel syndrome)   . Stroke ([place])   . Vitamin D deficiency    Social History   Tobacco Use  . Smoking status: Former Smoker    Packs/day: 0.75    Years: 35.00    Pack years: 26.25    Types: Cigarettes    Quit date: 12/28/2018    Years since quitting: 0.0  . Smokeless tobacco: Never Used  Substance Use Topics  . Alcohol use: No    Alcohol/week: 0.0 standard drinks  . Drug use: No    Current Outpatient Medications:  .  acetaminophen (TYLENOL) 325 MG tablet, Take 650 mg by mouth every 4 (four) hours as needed., Disp: , Rfl:  .  albuterol (VENTOLIN HFA) 108 (90 Base) MCG/ACT inhaler, Inhale 1-2 puffs into the lungs every 6 (six) hours as needed for wheezing or shortness of breath. Use with spacer, Disp: 8 g, Rfl: 0 .  ARIPiprazole (ABILIFY) 10 MG tablet, Take 10 mg by mouth at bedtime., Disp: , Rfl: 0 .  aspirin 81 MG chewable tablet, Chew 81 mg daily by mouth., Disp: , Rfl:  .  atorvastatin (LIPITOR) 40 MG tablet, TAKE 1 TABLET BY MOUTH EVERY DAY, Disp: 90 tablet, Rfl: 0 .  azelastine (ASTELIN) 0.1 % nasal spray, Place 2 sprays into both nostrils 2 (two)  times daily., Disp: 30 mL, Rfl: 2 .  benzonatate (TESSALON) 200 MG capsule, Take one cap TID PRN cough, Disp: 30 capsule, Rfl: 0 .  BREO ELLIPTA 100-25 MCG/INH AEPB, INHALE 1 PUFF INTO THE LUNGS DAILY, Disp: 60 each, Rfl: 2 .  clonazePAM (KLONOPIN) 0.5 MG tablet, TK 1 T PO DAILY PRN, Disp: , Rfl:  .  COMBIVENT RESPIMAT 20-100 MCG/ACT AERS respimat, INHALE 1 PUFF BY MOUTH EVERY 6 HOURS, Disp: 4 g, Rfl: 1 .  DULoxetine (CYMBALTA) 20 MG capsule, Take 2 capsules (40 mg total) by mouth daily.,  Disp: 60 capsule, Rfl: 5 .  FLUoxetine (PROZAC) 20 MG tablet, Take 20 mg by mouth daily., Disp: , Rfl:  .  gabapentin (NEURONTIN) 300 MG capsule, Take 2 capsules (600 mg total) by mouth 3 (three) times daily., Disp: 180 capsule, Rfl: 5 .  loratadine (CLARITIN) 10 MG tablet, Take 1 tablet (10 mg total) by mouth daily., Disp: 30 tablet, Rfl: 11 .  Melatonin 3 MG CAPS, Take 12 mg by mouth at bedtime as needed (sleep)., Disp: , Rfl:  .  meloxicam (MOBIC) 7.5 MG tablet, Take 7.5 mg by mouth daily. , Disp: , Rfl:  .  metoprolol succinate (TOPROL-XL) 25 MG 24 hr tablet, TAKE 1/2 TABLET(12.5 MG) BY MOUTH DAILY, Disp: 45 tablet, Rfl: 0 .  montelukast (SINGULAIR) 10 MG tablet, TAKE 1 TABLET(10 MG) BY MOUTH AT BEDTIME, Disp: 90 tablet, Rfl: 0 .  QUEtiapine (SEROQUEL) 25 MG tablet, Take 25 mg by mouth 3 (three) times daily as needed (anxiety)., Disp: , Rfl:  .  DULoxetine (CYMBALTA) 20 MG capsule, Take by mouth., Disp: , Rfl:  .  levofloxacin (LEVAQUIN) 500 MG tablet, Take 1 tablet (500 mg total) by mouth daily. For 7 days, Disp: 7 tablet, Rfl: 0 .  predniSONE (DELTASONE) 20 MG tablet, Take daily with food. Start with 52m (3 pills) x 2 days, then reduce to 448m(2 pills) x 3 days, then 208m1 pill) x 2 days, Disp: 14 tablet, Rfl: 0  Current Facility-Administered Medications:  .  albuterol (PROVENTIL) (2.5 MG/3ML) 0.083% nebulizer solution 2.5 mg, 2.5 mg, Nebulization, Once, PouFredderick SeveranceP  Depression screen [facility] 09/23/2018 07/29/2018 07/14/2018  Decreased Interest 0 1 1  Down, Depressed, Hopeless 0 1 1  PHQ - 2 Score 0 2 2  Altered sleeping - 2 2  Tired, decreased energy - 2 2  Change in appetite - 2 2  Feeling bad or failure about yourself  - 2 3  Trouble concentrating - 1 1  Moving slowly or fidgety/restless - 2 3  Suicidal thoughts - 0 0  PHQ-9 Score - 13 15  Difficult doing work/chores - Somewhat difficult Somewhat difficult  Some recent data might be hidden    GAD 7 : Generalized  Anxiety Score 10/09/2018 07/14/2018 06/26/2017 03/13/2017  Nervous, Anxious, on Edge '2 3 2 2  ' Control/stop worrying '2 1 3 2  ' Worry too much - different things '1 1 2 3  ' Trouble relaxing '3 2 3 3  ' Restless '2 1 1 1  ' Easily annoyed or irritable 1 0 0 0  Afraid - awful might happen 2 0 2 1  Total GAD 7 Score '13 8 13 12  ' Anxiety Difficulty Somewhat difficult Not difficult at all Very difficult -    -------------------------------------------------------------------------- O: No physical exam performed due to remote telephone encounter.  Lab results reviewed.  CLINICAL DATA:  [age]ar old female with cough.  EXAM: CHEST - 2 VIEW  COMPARISON:  Chest radiograph dated 09/08/2017.  FINDINGS: There is emphysematous changes of the lungs. No focal consolidation, pleural effusion, or pneumothorax. Faint 9 mm nodular density in the right mid lung field correspond to the nodule seen on the prior CT of 09/17/2017. Further evaluation with CT as per recommendation of the prior CT is advised. The cardiac silhouette is within normal limits. No acute osseous pathology. Lower thoracic old compression fracture with anterior wedging. Cervical ACDF.  IMPRESSION: 1. No acute cardiopulmonary process. 2. Emphysema. 3. Faint 9 mm nodular density in the right mid lung field corresponds to the nodule seen on the prior CT. Follow-up as per recommendation of the prior CT.  Recent Results (from the past 2160 hour(s))  Novel Coronavirus, NAA (Hosp order, Send-out to Ref Lab; TAT 18-24 hrs     Status: None   Collection Time: 12/28/18  2:52 PM   Specimen: Nasopharyngeal Swab; Respiratory  Result Value Ref Range   SARS-CoV-2, NAA NOT DETECTED NOT DETECTED    Comment: (NOTE) This nucleic acid amplification test was developed and its performance characteristics determined by [employer]. Nucleic acid amplification tests include PCR and TMA. This test has not been FDA cleared or approved. This test has  been authorized by FDA under an Emergency Use Authorization (EUA). This test is only authorized for the duration of time the declaration that circumstances exist justifying the authorization of the emergency use of in vitro diagnostic tests for detection of SARS-CoV-2 virus and/or diagnosis of COVID-19 infection under section 564(b)(1) of the Act, 21 U.S.C. 673ALP-3(X) (1), unless the authorization is terminated or revoked sooner. When diagnostic testing is negative, the possibility of a false negative result should be considered in the context of a patient's recent exposures and the presence of clinical signs and symptoms consistent with COVID-19. An individual without symptoms of COVID- 19 and who is not shedding SARS-CoV-2 vi rus would expect to have a negative (not detected) result in this assay. Performed At: [facility] [place], [place] [id number] Rush Farmer MD HG:[id number]    Coronavirus Source NASOPHARYNGEAL     Comment: Performed at [facility], [address] [place], [place] [address]  Rapid Strep Screen ([facility] ONLY)     Status: None   Collection Time: 12/28/18  2:59 PM   Specimen: Oral Mucosa/Gingiva; Other  Result Value Ref Range   Streptococcus, Group A Screen (Direct) NEGATIVE NEGATIVE    Comment: (NOTE) A Rapid Antigen test may result negative if the antigen level in the sample is below the detection level of this test. The FDA has not cleared this test as a stand-alone test therefore the rapid antigen negative result has reflexed to a Group A Strep culture. Performed at [facility], [address] [place], [place] [address]   Culture, group A strep     Status: None   Collection Time: 12/28/18  2:59 PM   Specimen: Throat  Result Value Ref Range   Specimen Description      THROAT Performed at [facility], [address], [place], [place] [address]    Special Requests      NONE  Reflexed from [id number] Performed at [facility], [address], [place], [place] [address]    Culture      NO GROUP A STREP (S.PYOGENES) ISOLATED Performed at [facility], [place] [address] [place], [place] [address]    Report Status 12/31/2018 FINAL  SARS Coronavirus 2 Ag (30 min TAT) - Nasal Swab (BD Veritor Kit)     Status: None   Collection Time: 12/28/18  3:18 PM   Specimen: Nasal Swab (BD Veritor Kit)  Result Value Ref Range   SARS Coronavirus 2 Ag NEGATIVE NEGATIVE    Comment: (NOTE) SARS-CoV-2 antigen NOT DETECTED.  Negative results are presumptive.  Negative results do not preclude SARS-CoV-2 infection and should not be used as the sole basis for treatment or other patient management decisions, including infection  control decisions, particularly in the presence of clinical signs and  symptoms consistent with COVID-19, or in those who have been in contact with the virus.  Negative results must be combined with clinical observations, patient history, and epidemiological information. The expected result is Negative. Fact Sheet for Patients: PodPark.tn Fact Sheet for Healthcare Providers: GiftContent.is This test is not yet approved or cleared by the [place] FDA and  has been authorized for detection and/or diagnosis of SARS-CoV-2 by FDA under an Emergency Use Authorization (EUA).  This EUA will remain in effect (meaning this test can be used) for the duration of  the COVID-19 de claration under Section 564(b)(1) of the Act, 21 U.S.C. section 360bbb-3(b)(1), unless the authorization is terminated or revoked sooner. Performed at [facility], [address], [place], [place] [address]     -------------------------------------------------------------------------- A&P:  Problem List Items Addressed This Visit    None    Visit Diagnoses    COPD with acute exacerbation ([place])     -  Primary   Relevant Medications   predniSONE (DELTASONE) 20 MG tablet   levofloxacin (LEVAQUIN) 500 MG tablet     Clinically with AECOPD, also underlying likely sinusitis Previously treated with doxy / prednisone, and COPD therapies but only temporary result. Now has recurrence of sinus drainage congestion and seems starting to flare up COPD again with wheezing  Repeat course treatment AECOPD Trial on Prednisone for longer than 4 day taper, now try 7 day taper as advised Trial on new antibiotic option Levaquin 565m daily x 7 days Continue albuterol / combivent / Breo  Follow-up as planned if not improving, consider future Pulm if indicated.  Meds ordered this encounter  Medications  . predniSONE (DELTASONE) 20 MG tablet    Sig: Take daily with food. Start with 688m(3 pills) x 2 days, then reduce to 4071m2 pills) x 3 days, then 78m45m pill) x 2 days    Dispense:  14 tablet    Refill:  0  . levofloxacin (LEVAQUIN) 500 MG tablet    Sig: Take 1 tablet (500 mg total) by mouth daily. For 7 days    Dispense:  7 tablet    Refill:  0    Follow-up: - Return in 1 week as needed COPD Sinusitis PRN  Patient verbalizes understanding with the above medical recommendations including the limitation of remote medical advice.  Specific follow-up and call-back criteria were given for patient to follow-up or seek medical care more urgently if needed.   - Time spent in direct consultation with patient on phone: 8 minutes   AlexNobie Putnam SViolaup 01/11/2019, 3:23 PM

## 2019-01-11 NOTE — Patient Instructions (Signed)
AVS given by phone. 

## 2019-01-20 DIAGNOSIS — G4700 Insomnia, unspecified: Secondary | ICD-10-CM | POA: Diagnosis not present

## 2019-01-20 DIAGNOSIS — F339 Major depressive disorder, recurrent, unspecified: Secondary | ICD-10-CM | POA: Diagnosis not present

## 2019-01-20 DIAGNOSIS — R69 Illness, unspecified: Secondary | ICD-10-CM | POA: Diagnosis not present

## 2019-02-01 DIAGNOSIS — K219 Gastro-esophageal reflux disease without esophagitis: Secondary | ICD-10-CM | POA: Diagnosis not present

## 2019-02-01 DIAGNOSIS — E785 Hyperlipidemia, unspecified: Secondary | ICD-10-CM | POA: Diagnosis not present

## 2019-02-01 DIAGNOSIS — Z008 Encounter for other general examination: Secondary | ICD-10-CM | POA: Diagnosis not present

## 2019-02-01 DIAGNOSIS — R69 Illness, unspecified: Secondary | ICD-10-CM | POA: Diagnosis not present

## 2019-02-01 DIAGNOSIS — G8929 Other chronic pain: Secondary | ICD-10-CM | POA: Diagnosis not present

## 2019-02-01 DIAGNOSIS — J449 Chronic obstructive pulmonary disease, unspecified: Secondary | ICD-10-CM | POA: Diagnosis not present

## 2019-02-01 DIAGNOSIS — M549 Dorsalgia, unspecified: Secondary | ICD-10-CM | POA: Diagnosis not present

## 2019-02-01 DIAGNOSIS — M1990 Unspecified osteoarthritis, unspecified site: Secondary | ICD-10-CM | POA: Diagnosis not present

## 2019-02-01 DIAGNOSIS — I739 Peripheral vascular disease, unspecified: Secondary | ICD-10-CM | POA: Diagnosis not present

## 2019-02-03 ENCOUNTER — Other Ambulatory Visit: Payer: Self-pay | Admitting: Nurse Practitioner

## 2019-02-03 DIAGNOSIS — J449 Chronic obstructive pulmonary disease, unspecified: Secondary | ICD-10-CM

## 2019-02-25 ENCOUNTER — Ambulatory Visit
Admission: RE | Admit: 2019-02-25 | Discharge: 2019-02-25 | Disposition: A | Discharge status: Home / Self Care | Payer: Medicare HMO | Source: Ambulatory Visit | Attending: Family Medicine | Admitting: Family Medicine

## 2019-02-25 DIAGNOSIS — Z1231 Encounter for screening mammogram for malignant neoplasm of breast: Secondary | ICD-10-CM | POA: Diagnosis not present

## 2019-03-10 ENCOUNTER — Other Ambulatory Visit: Payer: Self-pay | Admitting: Family Medicine

## 2019-03-10 DIAGNOSIS — I10 Essential (primary) hypertension: Secondary | ICD-10-CM

## 2019-03-11 DIAGNOSIS — R69 Illness, unspecified: Secondary | ICD-10-CM | POA: Diagnosis not present

## 2019-03-11 DIAGNOSIS — F339 Major depressive disorder, recurrent, unspecified: Secondary | ICD-10-CM | POA: Diagnosis not present

## 2019-03-11 DIAGNOSIS — G4700 Insomnia, unspecified: Secondary | ICD-10-CM | POA: Diagnosis not present

## 2019-03-22 ENCOUNTER — Encounter: Payer: Self-pay | Admitting: Family Medicine

## 2019-03-22 ENCOUNTER — Ambulatory Visit (INDEPENDENT_AMBULATORY_CARE_PROVIDER_SITE_OTHER): Payer: Medicare HMO | Admitting: Family Medicine

## 2019-03-22 ENCOUNTER — Other Ambulatory Visit: Payer: Self-pay

## 2019-03-22 DIAGNOSIS — M7989 Other specified soft tissue disorders: Secondary | ICD-10-CM | POA: Diagnosis not present

## 2019-03-22 NOTE — Patient Instructions (Signed)
Discussed needs to be seen in clinic for further evaluation and work up for bilateral lower extremity swelling.  While awaiting appointment for clinic, if having any worsening of symptoms, such as shortness of breath, cough, chest pain, dizziness, lightheadedness, headaches, palpitations to proceed to the emergency room immediately.  Patient verbalized understanding and denied any additional questions/concerns/needs.  Transferred to front desk receptionist to secure appointment for evaluation.

## 2019-03-22 NOTE — Progress Notes (Signed)
I have reviewed this encounter including the documentation in this note and/or discussed this patient with the provider, Cyndia Skeeters FNP. I am certifying that I agree with the content of this note as supervising physician.  Nobie Putnam, DO [facility] 03/22/2019, 5:20 PM

## 2019-03-22 NOTE — Progress Notes (Addendum)
Virtual Visit via Telephone The purpose of this virtual visit is to provide medical care while limiting exposure to the novel coronavirus (COVID19) for both patient and office staff.  Consent was obtained for phone visit:  Yes.   Answered questions that patient had about telehealth interaction:  Yes.   I discussed the limitations, risks, security and privacy concerns of performing an evaluation and management service by telephone. I also discussed with the patient that there may be a patient responsible charge related to this service. The patient expressed understanding and agreed to proceed.  Patient Location: Home Provider Location: [facility])  ---------------------------------------------------------------------- Chief Complaint  Patient presents with  . Leg Swelling    bilateral leg  below knee, ankles and feet swelling that worsen as the day progress x 3-4weeks     S: Reviewed CMA documentation. I have called patient and gathered additional HPI as follows:  Beth [name] reports that she has had bilateral lower extremity swelling x 3 weeks that continues to increase throughout the day and is better in the morning.  Feels that her legs get so tight that she can hardly walk.  Has some shortness of breath, but states that is no worse than her baseline with her COPD, feels tired, but is no worse than her baseline.  Denies any headaches, cough, dizziness, lightheadedness, chest pain, palpitations.  Denies any history of CHF.  Has a history of HTN but does not have a machine at home and hasn't been taking her blood pressure readings.  Reports taking all of her medications as directed.  Has not tried any compression stockings and has not previous history of bilateral lower leg swelling.  States both legs are the same size.  Denies any redness, tenderness or pain in calves.  Patient is currently home Denies any high risk travel to areas of current concern for  COVID19. Denies any known or suspected exposure to person with or possibly with COVID19.   Past Medical History:  Diagnosis Date  . Allergy   . Anxiety   . Asthma   . Chronic pain syndrome    discharged from pain clinic, hx of narcotics seeking behavior  . COPD (chronic obstructive pulmonary disease) ([place])   . CVA (cerebral infarction)   . Depression   . Fibromyalgia   . Headache   . Hyperlipidemia   . Hypertension   . IBS (irritable bowel syndrome)   . Stroke ([place])   . Vitamin D deficiency    Social History   Tobacco Use  . Smoking status: Former Smoker    Packs/day: 0.75    Years: 35.00    Pack years: 26.25    Types: Cigarettes    Quit date: 12/28/2018    Years since quitting: 0.2  . Smokeless tobacco: Never Used  Substance Use Topics  . Alcohol use: No    Alcohol/week: 0.0 standard drinks  . Drug use: No    Current Outpatient Medications:  .  albuterol (VENTOLIN HFA) 108 (90 Base) MCG/ACT inhaler, Inhale 1-2 puffs into the lungs every 6 (six) hours as needed for wheezing or shortness of breath. Use with spacer, Disp: 8 g, Rfl: 0 .  ARIPiprazole (ABILIFY) 10 MG tablet, Take 10 mg by mouth at bedtime., Disp: , Rfl: 0 .  aspirin 81 MG chewable tablet, Chew 81 mg daily by mouth., Disp: , Rfl:  .  atorvastatin (LIPITOR) 40 MG tablet, TAKE 1 TABLET BY MOUTH EVERY DAY, Disp: 90 tablet, Rfl: 0 .  azelastine (ASTELIN) 0.1 % nasal spray, Place 2 sprays into both nostrils 2 (two) times daily., Disp: 30 mL, Rfl: 2 .  BREO ELLIPTA 100-25 MCG/INH AEPB, INHALE 1 PUFF INTO THE LUNGS DAILY, Disp: 60 each, Rfl: 2 .  clonazePAM (KLONOPIN) 0.5 MG tablet, TK 1 T PO DAILY PRN, Disp: , Rfl:  .  COMBIVENT RESPIMAT 20-100 MCG/ACT AERS respimat, INHALE 1 PUFF BY MOUTH EVERY 6 HOURS, Disp: 4 g, Rfl: 1 .  DULoxetine (CYMBALTA) 20 MG capsule, Take 2 capsules (40 mg total) by mouth daily., Disp: 60 capsule, Rfl: 5 .  FLUoxetine (PROZAC) 20 MG tablet, Take 60 mg by mouth daily. , Disp: , Rfl:   .  gabapentin (NEURONTIN) 300 MG capsule, Take 2 capsules (600 mg total) by mouth 3 (three) times daily., Disp: 180 capsule, Rfl: 5 .  Melatonin 3 MG CAPS, Take 12 mg by mouth at bedtime as needed (sleep)., Disp: , Rfl:  .  metoprolol succinate (TOPROL-XL) 25 MG 24 hr tablet, TAKE 1/2 TABLET(12.5 MG) BY MOUTH DAILY, Disp: 45 tablet, Rfl: 0 .  SEROQUEL 100 MG tablet, Take 100 mg by mouth at bedtime., Disp: , Rfl:  .  acetaminophen (TYLENOL) 325 MG tablet, Take 650 mg by mouth every 4 (four) hours as needed., Disp: , Rfl:  .  benzonatate (TESSALON) 200 MG capsule, Take one cap TID PRN cough (Patient not taking: Reported on 03/22/2019), Disp: 30 capsule, Rfl: 0 .  levofloxacin (LEVAQUIN) 500 MG tablet, Take 1 tablet (500 mg total) by mouth daily. For 7 days (Patient not taking: Reported on 03/22/2019), Disp: 7 tablet, Rfl: 0 .  loratadine (CLARITIN) 10 MG tablet, Take 1 tablet (10 mg total) by mouth daily. (Patient not taking: Reported on 03/22/2019), Disp: 30 tablet, Rfl: 11 .  meloxicam (MOBIC) 7.5 MG tablet, Take 7.5 mg by mouth daily. , Disp: , Rfl:  .  montelukast (SINGULAIR) 10 MG tablet, TAKE 1 TABLET(10 MG) BY MOUTH AT BEDTIME (Patient not taking: Reported on 03/22/2019), Disp: 90 tablet, Rfl: 0 .  predniSONE (DELTASONE) 20 MG tablet, Take daily with food. Start with 32m (3 pills) x 2 days, then reduce to 438m(2 pills) x 3 days, then 2086m1 pill) x 2 days (Patient not taking: Reported on 03/22/2019), Disp: 14 tablet, Rfl: 0  Current Facility-Administered Medications:  .  albuterol (PROVENTIL) (2.5 MG/3ML) 0.083% nebulizer solution 2.5 mg, 2.5 mg, Nebulization, Once, PouFredderick SeveranceP  [facility] 03/22/2019 09/23/2018 07/29/2018  Decreased Interest 0 0 1  Down, Depressed, Hopeless 1 0 1  PHQ - 2 Score 1 0 2  Altered sleeping - - 2  Tired, decreased energy - - 2  Change in appetite - - 2  Feeling bad or failure about yourself  - - 2  Trouble concentrating - - 1  Moving  slowly or fidgety/restless - - 2  Suicidal thoughts - - 0  PHQ-9 Score - - 13  Difficult doing work/chores - - Somewhat difficult  Some recent data might be hidden    GAD 7 : Generalized Anxiety Score 10/09/2018 07/14/2018 06/26/2017 03/13/2017  Nervous, Anxious, on Edge _0 Control/stop worrying _1 Worry too much - different things _2 Trouble relaxing _3 Restless _4 Easily annoyed or irritable 1 0 0 0  Afraid - awful might happen 2 0 2 1  Total GAD 7 Score 13 8 13  12  Anxiety Difficulty Somewhat difficult Not difficult at all Very difficult -    -------------------------------------------------------------------------- O: No physical exam performed due to remote telephone encounter.   Recent Results (from the past 2160 hour(s))  Novel Coronavirus, NAA (Hosp order, Send-out to Ref Lab; TAT 18-24 hrs     Status: None   Collection Time: 12/28/18  2:52 PM   Specimen: Nasopharyngeal Swab; Respiratory  Result Value Ref Range   SARS-CoV-2, NAA NOT DETECTED NOT DETECTED    Comment: (NOTE) This nucleic acid amplification test was developed and its performance characteristics determined by [employer]. Nucleic acid amplification tests include PCR and TMA. This test has not been FDA cleared or approved. This test has been authorized by FDA under an Emergency Use Authorization (EUA). This test is only authorized for the duration of time the declaration that circumstances exist justifying the authorization of the emergency use of in vitro diagnostic tests for detection of SARS-CoV-2 virus and/or diagnosis of COVID-19 infection under section 564(b)(1) of the Act, 21 U.S.C. 892JJH-4(R) (1), unless the authorization is terminated or revoked sooner. When diagnostic testing is negative, the possibility of a false negative result should be considered in the context of a patient's recent exposures and the presence of clinical signs and symptoms consistent  with COVID-19. An individual without symptoms of COVID- 19 and who is not shedding SARS-CoV-2 vi rus would expect to have a negative (not detected) result in this assay. Performed At: [facility] [place], [place] [id number] Rush Farmer MD EH:[id number]    Coronavirus Source NASOPHARYNGEAL     Comment: Performed at [facility], [address] [place], [place] [address]  Rapid Strep Screen ([facility] ONLY)     Status: None   Collection Time: 12/28/18  2:59 PM   Specimen: Oral Mucosa/Gingiva; Other  Result Value Ref Range   Streptococcus, Group A Screen (Direct) NEGATIVE NEGATIVE    Comment: (NOTE) A Rapid Antigen test may result negative if the antigen level in the sample is below the detection level of this test. The FDA has not cleared this test as a stand-alone test therefore the rapid antigen negative result has reflexed to a Group A Strep culture. Performed at [facility], [address] [place], [place] [address]   Culture, group A strep     Status: None   Collection Time: 12/28/18  2:59 PM   Specimen: Throat  Result Value Ref Range   Specimen Description      THROAT Performed at [facility], [address], [place], [place] [address]    Special Requests      NONE Reflexed from [id number] Performed at [facility], [address] [place], [place] [address]    Culture      NO GROUP A STREP (S.PYOGENES) ISOLATED Performed at [facility], [place] [address], [place], [place] [address]    Report Status 12/31/2018 FINAL   SARS Coronavirus 2 Ag (30 min TAT) - Nasal Swab (BD Veritor Kit)     Status: None   Collection Time: 12/28/18  3:18 PM   Specimen: Nasal Swab (BD Veritor Kit)  Result Value Ref Range   SARS Coronavirus 2 Ag NEGATIVE NEGATIVE    Comment: (NOTE) SARS-CoV-2 antigen NOT DETECTED.  Negative results are presumptive.  Negative results do not preclude SARS-CoV-2 infection  and should not be used as the sole basis for treatment or other patient management decisions, including infection  control decisions, particularly in the presence of clinical signs and  symptoms consistent with COVID-19, or in those who have been in contact with the virus.  Negative results must be combined with clinical observations, patient history, and epidemiological information. The expected result is Negative. Fact Sheet for Patients: PodPark.tn Fact Sheet for Healthcare Providers: GiftContent.is This test is not yet approved or cleared by the [place] FDA and  has been authorized for detection and/or diagnosis of SARS-CoV-2 by FDA under an Emergency Use Authorization (EUA).  This EUA will remain in effect (meaning this test can be used) for the duration of  the COVID-19 de claration under Section 564(b)(1) of the Act, 21 U.S.C. section 360bbb-3(b)(1), unless the authorization is terminated or revoked sooner. Performed at [facility], [address], [place], [place] [address]     -------------------------------------------------------------------------- A&P:  Problem List Items Addressed This Visit      Acute   Leg swelling     Discussed needs to be seen in clinic for further evaluation and work up for bilateral lower extremity swelling.  While awaiting appointment for clinic, if having any worsening of symptoms, such as shortness of breath, cough, chest pain, dizziness, lightheadedness, headaches, palpitations to proceed to the emergency room immediately.  Patient verbalized understanding and denied any additional questions/concerns/needs.  No orders of the defined types were placed in this encounter.   Follow-up: To schedule an in clinic appointment for this week for assessment and determination if further testing (lab values, EKG, CXR) are needed.  Patient verbalizes understanding with the  above medical recommendations including the limitation of remote medical advice.  Specific follow-up and call-back criteria were given for patient to follow-up or seek medical care more urgently if needed.   - Time spent in direct consultation with patient on phone: 9 minutes  Harlin Rain, FNP-C Family Nurse Practitioner [facility] 03/22/2019, 2:57 PM

## 2019-03-23 ENCOUNTER — Telehealth: Payer: Self-pay | Admitting: Nurse Practitioner

## 2019-03-23 ENCOUNTER — Ambulatory Visit (INDEPENDENT_AMBULATORY_CARE_PROVIDER_SITE_OTHER): Payer: Medicare HMO | Admitting: General Practice

## 2019-03-23 ENCOUNTER — Encounter: Payer: Self-pay | Admitting: Family Medicine

## 2019-03-23 ENCOUNTER — Ambulatory Visit (INDEPENDENT_AMBULATORY_CARE_PROVIDER_SITE_OTHER): Payer: Medicare HMO | Admitting: Family Medicine

## 2019-03-23 ENCOUNTER — Other Ambulatory Visit: Payer: Self-pay

## 2019-03-23 VITALS — BP 119/52 | HR 78 | Temp 98.0°F | Resp 18 | Ht 65.0 in | Wt 168.8 lb

## 2019-03-23 DIAGNOSIS — F332 Major depressive disorder, recurrent severe without psychotic features: Secondary | ICD-10-CM

## 2019-03-23 DIAGNOSIS — M7989 Other specified soft tissue disorders: Secondary | ICD-10-CM

## 2019-03-23 DIAGNOSIS — I10 Essential (primary) hypertension: Secondary | ICD-10-CM | POA: Diagnosis not present

## 2019-03-23 DIAGNOSIS — J449 Chronic obstructive pulmonary disease, unspecified: Secondary | ICD-10-CM

## 2019-03-23 DIAGNOSIS — E785 Hyperlipidemia, unspecified: Secondary | ICD-10-CM | POA: Diagnosis not present

## 2019-03-23 DIAGNOSIS — R69 Illness, unspecified: Secondary | ICD-10-CM | POA: Diagnosis not present

## 2019-03-23 DIAGNOSIS — R0600 Dyspnea, unspecified: Secondary | ICD-10-CM

## 2019-03-23 DIAGNOSIS — R0609 Other forms of dyspnea: Secondary | ICD-10-CM

## 2019-03-23 LAB — CBC WITH DIFFERENTIAL/PLATELET
Absolute Monocytes: 533 cells/uL (ref 200–950)
Basophils Absolute: 51 cells/uL (ref 0–200)
Basophils Relative: 0.7 %
Eosinophils Absolute: 204 cells/uL (ref 15–500)
Eosinophils Relative: 2.8 %
HCT: 35.1 % (ref 35.0–45.0)
Hemoglobin: 11.3 g/dL — ABNORMAL LOW (ref 11.7–15.5)
Lymphs Abs: 1358 cells/uL (ref 850–3900)
MCH: 30.1 pg (ref 27.0–33.0)
MCHC: 32.2 g/dL (ref 32.0–36.0)
MCV: 93.4 fL (ref 80.0–100.0)
MPV: 10.1 fL (ref 7.5–12.5)
Monocytes Relative: 7.3 %
Neutro Abs: 5154 cells/uL (ref 1500–7800)
Neutrophils Relative %: 70.6 %
Platelets: 223 10*3/uL (ref 140–400)
RBC: 3.76 10*6/uL — ABNORMAL LOW (ref 3.80–5.10)
RDW: 13.4 % (ref 11.0–15.0)
Total Lymphocyte: 18.6 %
WBC: 7.3 10*3/uL (ref 3.8–10.8)

## 2019-03-23 LAB — COMPREHENSIVE METABOLIC PANEL
AG Ratio: 1.5 (calc) (ref 1.0–2.5)
ALT: 11 U/L (ref 6–29)
AST: 15 U/L (ref 10–35)
Albumin: 3.8 g/dL (ref 3.6–5.1)
Alkaline phosphatase (APISO): 103 U/L (ref 37–153)
BUN/Creatinine Ratio: 23 (calc) — ABNORMAL HIGH (ref 6–22)
BUN: 22 mg/dL (ref 7–25)
CO2: 32 mmol/L (ref 20–32)
Calcium: 9.6 mg/dL (ref 8.6–10.4)
Chloride: 107 mmol/L (ref 98–110)
Creat: 0.95 mg/dL — ABNORMAL HIGH (ref 0.60–0.93)
Globulin: 2.6 g/dL (calc) (ref 1.9–3.7)
Glucose, Bld: 102 mg/dL (ref 65–139)
Potassium: 4.8 mmol/L (ref 3.5–5.3)
Sodium: 143 mmol/L (ref 135–146)
Total Bilirubin: 0.5 mg/dL (ref 0.2–1.2)
Total Protein: 6.4 g/dL (ref 6.1–8.1)

## 2019-03-23 NOTE — Chronic Care Management (AMB) (Signed)
Chronic Care Management   Initial Visit Note  03/23/2019 Name: Beth [name] MRN: [id number] DOB: 1945/09/15  Referred by: Mikey College, NP (Inactive) Reason for referral : Chronic Care Management (Urgent/Intial Outreach: Chronic Disease Management and Care coordination Needs)   Beth [name] is a 74 y.o. year old female who is a primary care patient of Mikey College, NP (Inactive). The CCM team was consulted for assistance with chronic disease management and care coordination needs related to HTN, COPD, Depression and Bilateral Leg swelling  Review of patient status, including review of consultants reports, relevant laboratory and other test results, and collaboration with appropriate care team members and the patient's provider was performed as part of comprehensive patient evaluation and provision of chronic care management services.    SDOH (Social Determinants of Health) assessments performed: Yes See Care Plan activities for detailed interventions related to SDOH)  SDOH Interventions     Most Recent Value  SDOH Interventions  SDOH Interventions for the Following Domains  Alcohol Usage, Depression, Financial Strain, Physical Activity, Stress, Tobacco  Financial Strain Interventions  Other (Comment) [review of resources available, patient states her daughter pays her bills for her]  Physical Activity Interventions  Other (Comments) [Evaluation: The patient has limited ability to exercise due to health concerns at this time, Support and education]  Stress Interventions  Provide Counseling, Other (Comment) [referral to LCSW]  Tobacco Interventions  Other (Comment) [Patient states she quit 3 or 4 weeks ago, she is using patches will continue to monitor]  Alcohol Brief Interventions/Follow-up  AUDIT Score <7 follow-up not indicated       Medications: Outpatient Encounter Medications as of 03/23/2019  Medication Sig  . acetaminophen (TYLENOL) 325 MG tablet Take 650  mg by mouth every 4 (four) hours as needed.  Marland Kitchen albuterol (VENTOLIN HFA) 108 (90 Base) MCG/ACT inhaler Inhale 1-2 puffs into the lungs every 6 (six) hours as needed for wheezing or shortness of breath. Use with spacer  . ARIPiprazole (ABILIFY) 10 MG tablet Take 10 mg by mouth at bedtime.  Marland Kitchen aspirin 81 MG chewable tablet Chew 81 mg daily by mouth.  Marland Kitchen atorvastatin (LIPITOR) 40 MG tablet TAKE 1 TABLET BY MOUTH EVERY DAY  . azelastine (ASTELIN) 0.1 % nasal spray Place 2 sprays into both nostrils 2 (two) times daily.  . benzonatate (TESSALON) 200 MG capsule Take one cap TID PRN cough (Patient not taking: Reported on 03/22/2019)  . BREO ELLIPTA 100-25 MCG/INH AEPB INHALE 1 PUFF INTO THE LUNGS DAILY  . clonazePAM (KLONOPIN) 0.5 MG tablet TK 1 T PO DAILY PRN  . COMBIVENT RESPIMAT 20-100 MCG/ACT AERS respimat INHALE 1 PUFF BY MOUTH EVERY 6 HOURS  . DULoxetine (CYMBALTA) 20 MG capsule Take 2 capsules (40 mg total) by mouth daily.  Marland Kitchen FLUoxetine (PROZAC) 20 MG tablet Take 60 mg by mouth daily.   Marland Kitchen gabapentin (NEURONTIN) 300 MG capsule Take 2 capsules (600 mg total) by mouth 3 (three) times daily.  Marland Kitchen loratadine (CLARITIN) 10 MG tablet Take 1 tablet (10 mg total) by mouth daily.  . Melatonin 3 MG CAPS Take 12 mg by mouth at bedtime as needed (sleep).  . meloxicam (MOBIC) 7.5 MG tablet Take 7.5 mg by mouth daily.   . metoprolol succinate (TOPROL-XL) 25 MG 24 hr tablet TAKE 1/2 TABLET(12.5 MG) BY MOUTH DAILY  . montelukast (SINGULAIR) 10 MG tablet TAKE 1 TABLET(10 MG) BY MOUTH AT BEDTIME  . SEROQUEL 100 MG tablet Take 100 mg by mouth at bedtime.  Facility-Administered Encounter Medications as of 03/23/2019  Medication  . albuterol (PROVENTIL) (2.5 MG/3ML) 0.083% nebulizer solution 2.5 mg     Objective:  BP Readings from Last 3 Encounters:  03/23/19 (!) 119/52  12/28/18 (!) 144/76  09/23/18 131/75    Goals Addressed            This Visit's Progress   . RNCM: I have a lot of health issues going on        Beth [name] (see longtitudinal plan of care for additional care plan information)  Current Barriers:  . Chronic Disease Management support, education, and care coordination needs related to HTN, COPD, Depression, and Bilateral leg swelling   Clinical Goal(s) related to HTN, COPD, Depression, and bilateral leg swelling :  Over the next 90 days, patient will:  . Work with the care management team to address educational, disease management, and care coordination needs  . Begin or continue self health monitoring activities as directed today Measure and record blood pressure 5 times per week, Measure and record weight daily, and wear compression stockings to help with bilateral leg swelling  . Call provider office for new or worsened signs and symptoms Blood pressure findings outside established parameters, Weight outside established parameters, Shortness of breath, and New or worsened symptom related to bilateral leg swelling or depression . Call care management team with questions or concerns . Verbalize basic understanding of patient centered plan of care established today  Interventions related to HTN, COPD, Depression, and Bilateral lower leg swelling :  . Evaluation of current treatment plans and patient's adherence to plan as established by provider . Assessed patient understanding of disease states . Assessed patient's education and care coordination needs . Provided the patient with contact information  . Assessed for urgent cardiology appointment: The patient has an appointment with [facility] on 03-25-2019 at 11:30 am . Assessed for device needs. The patient does not have a blood pressure cuff or a scale to monitor blood pressure or weights. Review of resources available. . Email to clinical administrator on help for obtaining blood pressure cuff and scale. It will be ordered tomorrow from [employer] and shipped directly to the patient. . Provided disease specific  education to patient: review of wearing of compression stockings (currently the patient is using compression stockings on bilateral legs.  Assessment reveals that the patient does not have any weeping from the legs or sores), elevation of feet and legs when sitting, the benefit of using blood pressure cuff to take daily readings, and the benefit of using a scale to record daily weights. Nash Dimmer with appropriate clinical care team members regarding patient needs: Pharmacy referral in place, LCSW referral in place. The CCM team to assist with meeting the patients health and wellness needs and goals by working with the pcp and interdisciplinary team.  Patient Self Care Activities related to HTN, COPD, Depression, and bilateral leg swelling :  . Patient is unable to independently self-manage chronic health conditions  Initial goal documentation         Beth [name] was given information about Chronic Care Management services today including:  1. CCM service includes personalized support from designated clinical staff supervised by her physician, including individualized plan of care and coordination with other care providers 2. 24/7 contact phone numbers for assistance for urgent and routine care needs. 3. Service will only be billed when office clinical staff spend 20 minutes or more in a month to coordinate care. 4. Only one  practitioner may furnish and bill the service in a calendar month. 5. The patient may stop CCM services at any time (effective at the end of the month) by phone call to the office staff. 6. The patient will be responsible for cost sharing (co-pay) of up to 20% of the service fee (after annual deductible is met).  Patient agreed to services and verbal consent obtained.   Plan:   The care management team will reach out to the patient again over the next 7 to 14 days days.  [name] RN, MSN, [place] Mobile: [phone]

## 2019-03-23 NOTE — Patient Instructions (Signed)
Visit Information  Goals Addressed            This Visit's Progress    RNCM: I have a lot of health issues going on       [place] (see longtitudinal plan of care for additional care plan information)  Current Barriers:   Chronic Disease Management support, education, and care coordination needs related to HTN, COPD, Depression, and Bilateral leg swelling   Clinical Goal(s) related to HTN, COPD, Depression, and bilateral leg swelling :  Over the next 90 days, patient will:   Work with the care management team to address educational, disease management, and care coordination needs   Begin or continue self health monitoring activities as directed today Measure and record blood pressure 5 times per week, Measure and record weight daily, and wear compression stockings to help with bilateral leg swelling   Call provider office for new or worsened signs and symptoms Blood pressure findings outside established parameters, Weight outside established parameters, Shortness of breath, and New or worsened symptom related to bilateral leg swelling or depression  Call care management team with questions or concerns  Verbalize basic understanding of patient centered plan of care established today  Interventions related to HTN, COPD, Depression, and Bilateral lower leg swelling :   Evaluation of current treatment plans and patient's adherence to plan as established by provider  Assessed patient understanding of disease states  Assessed patient's education and care coordination needs  Provided the patient with contact information   Assessed for urgent cardiology appointment: The patient has an appointment with [facility] on 03-25-2019 at 11:30 am  Assessed for device needs. The patient does not have a blood pressure cuff or a scale to monitor blood pressure or weights. Review of resources available.  Email to clinical administrator on help for obtaining blood pressure  cuff and scale. It will be ordered tomorrow from [employer] and shipped directly to the patient.  Provided disease specific education to patient: review of wearing of compression stockings (currently the patient is using compression stockings on bilateral legs.  Assessment reveals that the patient does not have any weeping from the legs or sores), elevation of feet and legs when sitting, the benefit of using blood pressure cuff to take daily readings, and the benefit of using a scale to record daily weights.  Collaborated with appropriate clinical care team members regarding patient needs: Pharmacy referral in place, LCSW referral in place. The CCM team to assist with meeting the patients health and wellness needs and goals by working with the pcp and interdisciplinary team.  Patient Self Care Activities related to HTN, COPD, Depression, and bilateral leg swelling :   Patient is unable to independently self-manage chronic health conditions  Initial goal documentation        Ms. Nixon was given information about Chronic Care Management services today including:  1. CCM service includes personalized support from designated clinical staff supervised by her physician, including individualized plan of care and coordination with other care providers 2. 24/7 contact phone numbers for assistance for urgent and routine care needs. 3. Service will only be billed when office clinical staff spend 20 minutes or more in a month to coordinate care. 4. Only one practitioner may furnish and bill the service in a calendar month. 5. The patient may stop CCM services at any time (effective at the end of the month) by phone call to the office staff. 6. The patient will be responsible for cost sharing (  co-pay) of up to 20% of the service fee (after annual deductible is met).  Patient agreed to services and verbal consent obtained.   Patient verbalizes understanding of instructions provided today.   The care  management team will reach out to the patient again over the next 7 to 14 days days.  [name] RN, MSN, [place] Mobile: [phone]

## 2019-03-23 NOTE — Progress Notes (Signed)
I have reviewed this encounter including the documentation in this note and/or discussed this patient with the provider, Cyndia Skeeters FNP. I am certifying that I agree with the content of this note as supervising physician.  Nobie Putnam, DO [facility] 03/23/2019, 1:16 PM

## 2019-03-23 NOTE — Progress Notes (Signed)
Subjective:    Patient ID: Beth [name], female    DOB: [birth year], 74 y.o.   MRN: [id number]  Beth [name] is a 74 y.o. female presenting on 03/23/2019 for Leg Swelling (peripheral edema after prolong sitting and standing makes the swelling worse x 3 weeks. 18lb weight gain over 2 mth period)   HPI  Beth [name] presents to clinic for evaluation of bilateral lower leg swelling x 3 weeks with 18lb weight gain in the past 2 months, increased dyspnea on exertion and fatigue.  Has been sleeping with 1 large pillow at night.  Denies cough, dizziness, lightheadedness, CP, palpitations, increased GERD, numbness, tingling or weakness. Has not had leg swelling in the past.  Does have a history of COPD but states that she had increased DOE and fatigue in the past 3 weeks.  Depression screen [facility] 2/9 03/22/2019 09/23/2018 07/29/2018  Decreased Interest 0 0 1  Down, Depressed, Hopeless 1 0 1  PHQ - 2 Score 1 0 2  Altered sleeping - - 2  Tired, decreased energy - - 2  Change in appetite - - 2  Feeling bad or failure about yourself  - - 2  Trouble concentrating - - 1  Moving slowly or fidgety/restless - - 2  Suicidal thoughts - - 0  PHQ-9 Score - - 13  Difficult doing work/chores - - Somewhat difficult  Some recent data might be hidden    Social History   Tobacco Use  . Smoking status: Former Smoker    Packs/day: 0.75    Years: 35.00    Pack years: 26.25    Types: Cigarettes    Quit date: 12/28/2018    Years since quitting: 0.2  . Smokeless tobacco: Never Used  Substance Use Topics  . Alcohol use: No    Alcohol/week: 0.0 standard drinks  . Drug use: No    Review of Systems  Constitutional: Positive for activity change and fatigue. Negative for appetite change, chills, diaphoresis and fever.  HENT: Negative for trouble swallowing.   Eyes: Negative for pain and visual disturbance.  Respiratory: Positive for shortness of breath. Negative for apnea, cough, choking, chest  tightness, wheezing and stridor.   Cardiovascular: Positive for leg swelling. Negative for chest pain and palpitations.  Gastrointestinal: Negative for abdominal pain, constipation, diarrhea, nausea and vomiting.  Endocrine: Negative for polydipsia, polyphagia and polyuria.  Genitourinary: Negative for difficulty urinating, dysuria, frequency and urgency.  Musculoskeletal: Negative for back pain, myalgias and neck pain.  Neurological: Negative for dizziness, tremors, syncope, weakness, light-headedness, numbness and headaches.  Hematological: Negative for adenopathy. Does not bruise/bleed easily.  Psychiatric/Behavioral: Negative.    Per HPI unless specifically indicated above     Objective:    BP (!) 119/52 (BP Location: Right Arm, Patient Position: Sitting, Cuff Size: Normal)   Pulse 78   Temp 98 F (36.7 C) (Oral)   Resp 18   Ht _0  (1.651 m)   Wt 168 lb 12.8 oz (76.6 kg)   SpO2 97%   BMI 28.09 kg/m   Wt Readings from Last 3 Encounters:  03/23/19 168 lb 12.8 oz (76.6 kg)  12/28/18 150 lb (68 kg)  09/23/18 150 lb (68 kg)    Physical Exam Vitals reviewed.  Constitutional:      Appearance: Normal appearance. She is well-groomed and normal weight.  HENT:     Head: Normocephalic.  Eyes:     General: Lids are normal. Vision grossly intact. No scleral icterus.  Right eye: No discharge.        Left eye: No discharge.     Extraocular Movements: Extraocular movements intact.     Conjunctiva/sclera: Conjunctivae normal.     Pupils: Pupils are equal, round, and reactive to light.  Neck:     Vascular: No carotid bruit.  Cardiovascular:     Rate and Rhythm: Normal rate and regular rhythm.     Pulses: Normal pulses.          Dorsalis pedis pulses are 2+ on the right side and 2+ on the left side.       Posterior tibial pulses are 2+ on the right side and 2+ on the left side.     Heart sounds: Normal heart sounds. No murmur. No friction rub. No gallop.   Pulmonary:      Effort: Pulmonary effort is normal. No respiratory distress.     Breath sounds: Normal breath sounds. No wheezing, rhonchi or rales.  Chest:     Chest wall: No tenderness or edema.  Abdominal:     General: Abdomen is flat. Bowel sounds are normal. There is no distension.     Palpations: Abdomen is soft. There is no mass.     Tenderness: There is no abdominal tenderness. There is no guarding or rebound.     Hernia: No hernia is present.  Musculoskeletal:     Cervical back: Normal range of motion and neck supple. No rigidity.     Right lower leg: 3+ Edema present.     Left lower leg: 3+ Edema present.  Lymphadenopathy:     Cervical: No cervical adenopathy.  Skin:    General: Skin is warm and dry.     Capillary Refill: Capillary refill takes less than 2 seconds.  Neurological:     General: No focal deficit present.     Mental Status: She is alert and oriented to person, place, and time.     Cranial Nerves: No cranial nerve deficit.     Sensory: Sensation is intact.     Gait: Gait abnormal (staggered steps, baseline).  Psychiatric:        Attention and Perception: Attention and perception normal.        Mood and Affect: Mood and affect normal.        Speech: Speech normal.        Behavior: Behavior normal. Behavior is cooperative.        Thought Content: Thought content normal.        Cognition and Memory: Cognition and memory normal.        Judgment: Judgment normal.    Results for orders placed or performed during the hospital encounter of 12/28/18  Novel Coronavirus, NAA (Hosp order, Send-out to Ref Lab; TAT 18-24 hrs   Specimen: Nasopharyngeal Swab; Respiratory  Result Value Ref Range   SARS-CoV-2, NAA NOT DETECTED NOT DETECTED   Coronavirus Source NASOPHARYNGEAL   Rapid Strep Screen ([facility] ONLY)   Specimen: Oral Mucosa/Gingiva; Other  Result Value Ref Range   Streptococcus, Group A Screen (Direct) NEGATIVE NEGATIVE  SARS Coronavirus 2 Ag (30 min TAT) - Nasal Swab  (BD Veritor Kit)   Specimen: Nasal Swab (BD Veritor Kit)  Result Value Ref Range   SARS Coronavirus 2 Ag NEGATIVE NEGATIVE  Culture, group A strep   Specimen: Throat  Result Value Ref Range   Specimen Description      THROAT Performed at [facility], [address] [place],  [place] [address]    Special Requests      NONE Reflexed from [id number] Performed at [facility], [address] [place], [place] [address]    Culture      NO GROUP A STREP (S.PYOGENES) ISOLATED Performed at [facility], [place] [address], [place], [place] [address]    Report Status 12/31/2018 FINAL       Assessment & Plan:   Problem List Items Addressed This Visit      Cardiovascular and Mediastinum   Benign hypertension   Relevant Orders   Ambulatory referral to Cardiology   Ambulatory referral to Chronic Care Management Services     Other   Leg swelling - Primary    Patient seen in clinic today with 3+ bilateral lower extremity edema without pitting.  Socks leaving impressions above ankle.  2+ pulses, good ROM and strength.  S1, S2 heard in clinic today.  No S3 noted upon auscultation.  Lungs CTA.  EKG ordered but machine malfunction so unavailable in clinic today.  Urgent referral to cardiology placed for further evaluation and treatment.  Labs drawn today and will call once results are received.  Referral to chronic care case management for assistance with patient needs, pharmacy, social work and Tourist information centre manager.      Relevant Orders   CBC with Differential   Comprehensive Metabolic Panel (CMET)   Ambulatory referral to Cardiology   Ambulatory referral to Chronic Care Management Services   Dyslipidemia   Relevant Orders   Ambulatory referral to Cardiology   Ambulatory referral to Chronic Care Management Services    Other Visit Diagnoses    Dyspnea on exertion       Relevant Orders   CBC with Differential   Comprehensive Metabolic Panel (CMET)   Ambulatory  referral to Cardiology   Ambulatory referral to Chronic Care Management Services      No orders of the defined types were placed in this encounter.     Follow up plan: Return in about 3 months (around 06/20/2019) for Follow up chronic conditions.  Will keep in touch with patient and chronic care management to ensure patient is seen by cardiology practice this week.  ER precautions given.  Harlin Rain, [place] Family Nurse Practitioner [facility] 03/23/2019, 12:03 PM

## 2019-03-23 NOTE — Chronic Care Management (AMB) (Signed)
  Chronic Care Management   Note  03/23/2019 Name: [name] MRN: [id number] DOB: 1945-07-31  Beth [name] is a 74 y.o. year old female who is a primary care patient of Mikey College, NP (Inactive). I reached out to Beth [name] by phone today in response to a referral sent by Beth [name]'s PCP, Cyndia Skeeters FNP     Beth [name] was given information about Chronic Care Management services today including:  1. CCM service includes personalized support from designated clinical staff supervised by her physician, including individualized plan of care and coordination with other care providers 2. 24/7 contact phone numbers for assistance for urgent and routine care needs. 3. Service will only be billed when office clinical staff spend 20 minutes or more in a month to coordinate care. 4. Only one practitioner may furnish and bill the service in a calendar month. 5. The patient may stop CCM services at any time (effective at the end of the month) by phone call to the office staff. 6. The patient will be responsible for cost sharing (co-pay) of up to 20% of the service fee (after annual deductible is met).  Patient agreed to services and verbal consent obtained.   Follow up plan: Telephone appointment with care management team member scheduled for:03/29/2019  Glenna Durand, LPN Health Advisor, [place] [facility] ??[email or web address] ??[phone]

## 2019-03-23 NOTE — Patient Instructions (Signed)
We have drawn your labs today and I will call once we receive the results.  I have put in a referral to chronic care management to assist with your cardiology referral and they will be touching base with you to go over your medications and any resources that you may need.  If you have any worsening of your shortness of breath, any chest pain, any dizziness, lightheadedness, palpitations to proceed to the emergency room immediately!  You will receive a survey after today's visit either digitally by e-mail or paper by [employer]. Your experiences and feedback matter to [place].  Please respond so we know how we are doing as we provide care for you.  Call us with any questions/concerns/needs.  It is my goal to be available to you for your health concerns.  Thanks for choosing me to be a partner in your healthcare needs!  Harlin Rain, FNP-C Family Nurse Practitioner [facility] Phone: [phone]

## 2019-03-23 NOTE — Assessment & Plan Note (Signed)
Patient seen in clinic today with 3+ bilateral lower extremity edema without pitting.  Socks leaving impressions above ankle.  2+ pulses, good ROM and strength.  S1, S2 heard in clinic today.  No S3 noted upon auscultation.  Lungs CTA.  EKG ordered but machine malfunction so unavailable in clinic today.  Urgent referral to cardiology placed for further evaluation and treatment.  Labs drawn today and will call once results are received.  Referral to chronic care case management for assistance with patient needs, pharmacy, social work and Tourist information centre manager.

## 2019-03-24 NOTE — Progress Notes (Signed)
Reviewed labs, hemoglobin slightly low compared to last year.  Is following with Cardiology on 03/25/2019.  Will have patient follow up after cardiology to have labs repeated.

## 2019-03-25 ENCOUNTER — Encounter: Payer: Self-pay | Admitting: Cardiology

## 2019-03-25 ENCOUNTER — Ambulatory Visit (INDEPENDENT_AMBULATORY_CARE_PROVIDER_SITE_OTHER): Payer: Medicare HMO | Admitting: Cardiology

## 2019-03-25 ENCOUNTER — Other Ambulatory Visit: Payer: Self-pay

## 2019-03-25 VITALS — BP 124/64 | HR 71 | Ht 65.0 in | Wt 168.0 lb

## 2019-03-25 DIAGNOSIS — E7800 Pure hypercholesterolemia, unspecified: Secondary | ICD-10-CM | POA: Diagnosis not present

## 2019-03-25 DIAGNOSIS — I10 Essential (primary) hypertension: Secondary | ICD-10-CM

## 2019-03-25 DIAGNOSIS — M7989 Other specified soft tissue disorders: Secondary | ICD-10-CM

## 2019-03-25 MED ORDER — FUROSEMIDE 20 MG PO TABS: 20.0000 mg | ORAL_TABLET | Freq: Every day | ORAL | 3 refills | Status: DC

## 2019-03-25 NOTE — Progress Notes (Signed)
Cardiology Office Note:    Date:  03/25/2019   ID:  Beth [name], Beth [name] [birth year], MRN [id number]  PCP:  Mikey College, NP (Inactive)  Cardiologist:  Kate Sable, MD  Electrophysiologist:  None   Referring MD: Verl Bangs, FNP   Chief Complaint  Patient presents with  . New Patient (Initial Visit)    Referred by PCP for SOB and leg swelling. Meds reviewed verbally with patient.     History of Present Illness:    Beth [name] is a 74 y.o. female with a hx of asthma, CVA, COPD, former smoker, hypertension, hyperlipidemia who presents due to lower extremity edema.  Patient has noticed swelling in her legs for about 6 months now.  The symptoms of edema usually worsen throughout the day.  She was told she has a heart murmur about 20 years ago but never followed up.  The swelling makes her legs feel uncomfortable.  She denies palpitations, chest pain or shortness of breath.  She states having no edema 7 months ago.  Past Medical History:  Diagnosis Date  . Allergy   . Anxiety   . Asthma   . Chronic pain syndrome    discharged from pain clinic, hx of narcotics seeking behavior  . COPD (chronic obstructive pulmonary disease) ([place])   . CVA (cerebral infarction)   . Depression   . Fibromyalgia   . Headache   . Hyperlipidemia   . Hypertension   . IBS (irritable bowel syndrome)   . Stroke ([place])   . Vitamin D deficiency     Past Surgical History:  Procedure Laterality Date  . ABDOMINAL HYSTERECTOMY    . APPENDECTOMY    . BREAST EXCISIONAL BIOPSY  2011   Pt states lump removed, ? side, no scar seen  . BREAST SURGERY  2011   biopsy  . CERVICAL DISCECTOMY    . CHOLECYSTECTOMY    . SINUSOTOMY      Current Medications: Current Meds  Medication Sig  . acetaminophen (TYLENOL) 325 MG tablet Take 650 mg by mouth every 4 (four) hours as needed.  Marland Kitchen albuterol (VENTOLIN HFA) 108 (90 Base) MCG/ACT inhaler Inhale 1-2 puffs into the lungs every 6 (six) hours as  needed for wheezing or shortness of breath. Use with spacer  . ARIPiprazole (ABILIFY) 10 MG tablet Take 10 mg by mouth at bedtime.  Marland Kitchen aspirin 81 MG chewable tablet Chew 81 mg daily by mouth.  Marland Kitchen atorvastatin (LIPITOR) 40 MG tablet TAKE 1 TABLET BY MOUTH EVERY DAY  . azelastine (ASTELIN) 0.1 % nasal spray Place 2 sprays into both nostrils 2 (two) times daily.  . benzonatate (TESSALON) 200 MG capsule Take one cap TID PRN cough  . BREO ELLIPTA 100-25 MCG/INH AEPB INHALE 1 PUFF INTO THE LUNGS DAILY  . clonazePAM (KLONOPIN) 0.5 MG tablet TK 1 T PO DAILY PRN  . COMBIVENT RESPIMAT 20-100 MCG/ACT AERS respimat INHALE 1 PUFF BY MOUTH EVERY 6 HOURS  . DULoxetine (CYMBALTA) 20 MG capsule Take 2 capsules (40 mg total) by mouth daily.  Marland Kitchen FLUoxetine (PROZAC) 20 MG tablet Take 60 mg by mouth daily.   Marland Kitchen gabapentin (NEURONTIN) 300 MG capsule Take 2 capsules (600 mg total) by mouth 3 (three) times daily.  Marland Kitchen loratadine (CLARITIN) 10 MG tablet Take 1 tablet (10 mg total) by mouth daily.  . Melatonin 3 MG CAPS Take 12 mg by mouth at bedtime as needed (sleep).  . meloxicam (MOBIC) 7.5 MG tablet Take 7.5 mg by  mouth daily.   . metoprolol succinate (TOPROL-XL) 25 MG 24 hr tablet TAKE 1/2 TABLET(12.5 MG) BY MOUTH DAILY  . montelukast (SINGULAIR) 10 MG tablet TAKE 1 TABLET(10 MG) BY MOUTH AT BEDTIME  . SEROQUEL 100 MG tablet Take 100 mg by mouth at bedtime.   Current Facility-Administered Medications for the 03/25/19 encounter (Office Visit) with Kate Sable, MD  Medication  . albuterol (PROVENTIL) (2.5 MG/3ML) 0.083% nebulizer solution 2.5 mg     Allergies:   Bextra  [valdecoxib], Compazine [prochlorperazine edisylate], Lithium carbonate, and Lyrica [pregabalin]   Social History   Socioeconomic History  . Marital status: Legally Separated    Spouse name: Not on file  . Number of children: 2  . Years of education: Not on file  . Highest education level: Not on file  Occupational History  . Not on file    Tobacco Use  . Smoking status: Former Smoker    Packs/day: 0.75    Years: 35.00    Pack years: 26.25    Types: Cigarettes    Quit date: 12/28/2018    Years since quitting: 0.2  . Smokeless tobacco: Never Used  Substance and Sexual Activity  . Alcohol use: No    Alcohol/week: 0.0 standard drinks  . Drug use: No  . Sexual activity: Never  Other Topics Concern  . Not on file  Social History Narrative  . Not on file   Social Determinants of Health   Financial Resource Strain: Medium Risk  . Difficulty of Paying Living Expenses: Somewhat hard  Food Insecurity: No Food Insecurity  . Worried About Charity fundraiser in the Last Year: Never true  . Ran Out of Food in the Last Year: Never true  Transportation Needs: No Transportation Needs  . Lack of Transportation (Medical): No  . Lack of Transportation (Non-Medical): No  Physical Activity: Inactive  . Days of Exercise per Week: 0 days  . Minutes of Exercise per Session: 0 min  Stress: Stress Concern Present  . Feeling of Stress : Very much  Social Connections:   . Frequency of Communication with Friends and Family: Not on file  . Frequency of Social Gatherings with Friends and Family: Not on file  . Attends Religious Services: Not on file  . Active Member of Clubs or Organizations: Not on file  . Attends Archivist Meetings: Not on file  . Marital Status: Not on file     Family History: The patient's family history includes Alcohol abuse in her father; Anxiety disorder in her mother; Breast cancer (age of onset: 39) in her mother; Cancer in her father; Depression in her father and mother; Gallbladder disease in her father.  ROS:   Please see the history of present illness.     All other systems reviewed and are negative.  EKGs/Labs/Other Studies Reviewed:    The following studies were reviewed today:   EKG:  EKG is  ordered today.  The ekg ordered today demonstrates sinus rhythm, normal ECG.  Recent  Labs: 03/23/2019: ALT 11; BUN 22; Creat 0.95; Hemoglobin 11.3; Platelets 223; Potassium 4.8; Sodium 143  Recent Lipid Panel    Component Value Date/Time   CHOL 135 04/10/2018 1115   CHOL 118 03/23/2015 1536   CHOL 185 12/25/2012 1449   TRIG 50 04/10/2018 1115   TRIG 93 12/25/2012 1449   HDL 59 04/10/2018 1115   HDL 44 03/23/2015 1536   HDL 39 (L) 12/25/2012 1449   CHOLHDL 2.3 04/10/2018 1115  VLDL 18 03/21/2016 0936   VLDL 19 12/25/2012 1449   LDLCALC 64 04/10/2018 1115   LDLCALC 127 (H) 12/25/2012 1449    Physical Exam:    VS:  BP 124/64 (BP Location: Right Arm, Patient Position: Sitting, Cuff Size: Normal)   Pulse 71   Ht 5\' 5"  (1.651 m)   Wt 168 lb (76.2 kg)   SpO2 93%   BMI 27.96 kg/m     Wt Readings from Last 3 Encounters:  03/25/19 168 lb (76.2 kg)  03/23/19 168 lb 12.8 oz (76.6 kg)  12/28/18 150 lb (68 kg)     GEN:  Well nourished, well developed in no acute distress HEENT: Normal NECK: No JVD; No carotid bruits LYMPHATICS: No lymphadenopathy CARDIAC: RRR, faint 1/6 systolic murmur right lower sternal border.  No rubs, gallops RESPIRATORY: Clear but decreased breath sounds overall,  ABDOMEN: Soft, non-tender, non-distended MUSCULOSKELETAL:  2+ nonpitting edema; No deformity  SKIN: Warm and dry NEUROLOGIC:  Alert and oriented x 3 PSYCHIATRIC:  Normal affect   ASSESSMENT:    1. Leg swelling   2. Essential hypertension   3. Pure hypercholesterolemia    PLAN:    In order of problems listed above:  1. Patient with 2+ nonpitting edema in the lower extremity.  Faint systolic murmur noted on clinical exam.  Will evaluate with echocardiogram to rule out any structural heart disease.  Start Lasix 20 mg daily to see if swelling improves.  Patient advised to check daily weights and double Lasix dose if she gains over 3 pounds in a day or 5 pounds in a week. 2. History of hypertension, blood pressure well controlled.  Continue beta-blocker. 3. History of  hyperlipidemia, continue Lipitor as prescribed.  Follow-up after echocardiogram.   Medication Adjustments/Labs and Tests Ordered: Current medicines are reviewed at length with the patient today.  Concerns regarding medicines are outlined above.  Orders Placed This Encounter  Procedures  . EKG 12-Lead  . ECHOCARDIOGRAM COMPLETE   Meds ordered this encounter  Medications  . furosemide (LASIX) 20 MG tablet    Sig: Take 1 tablet (20 mg total) by mouth daily.    Dispense:  90 tablet    Refill:  3    Patient Instructions  Medication Instructions:  Your physician has recommended you make the following change in your medication:  1. START Furosemide 20 mg once daily  *If you need a refill on your cardiac medications before your next appointment, please call your pharmacy*  Lab Work: None If you have labs (blood work) drawn today and your tests are completely normal, you will receive your results only by: Marland Kitchen MyChart Message (if you have MyChart) OR . A paper copy in the mail If you have any lab test that is abnormal or we need to change your treatment, we will call you to review the results.  Testing/Procedures: Your physician has requested that you have an echocardiogram. Echocardiography is a painless test that uses sound waves to create images of your heart. It provides your doctor with information about the size and shape of your heart and how well your heart's chambers and valves are working. This procedure takes approximately one hour. There are no restrictions for this procedure.    Follow-Up: At [facility], you and your health needs are our priority.  As part of our continuing mission to provide you with exceptional heart care, we have created designated Provider Care Teams.  These Care Teams include your primary Cardiologist (physician) and  Advanced Practice Providers (APPs -  Physician Assistants and Nurse Practitioners) who all work together to provide you with the care  you need, when you need it.  Your next appointment:   Follow up after echocardiogram has been done   The format for your next appointment:   In Person  Provider:    You may see Kate Sable, MD or one of the following Advanced Practice Providers on your designated Care Team:    Murray Hodgkins, NP  Christell Faith, PA-C  Marrianne Mood, PA-C       Signed, Kate Sable, MD  03/25/2019 12:03 PM    [place]

## 2019-03-25 NOTE — Patient Instructions (Signed)
Medication Instructions:  Your physician has recommended you make the following change in your medication:  1. START Furosemide 20 mg once daily  *If you need a refill on your cardiac medications before your next appointment, please call your pharmacy*  Lab Work: None If you have labs (blood work) drawn today and your tests are completely normal, you will receive your results only by: Marland Kitchen MyChart Message (if you have MyChart) OR . A paper copy in the mail If you have any lab test that is abnormal or we need to change your treatment, we will call you to review the results.  Testing/Procedures: Your physician has requested that you have an echocardiogram. Echocardiography is a painless test that uses sound waves to create images of your heart. It provides your doctor with information about the size and shape of your heart and how well your heart's chambers and valves are working. This procedure takes approximately one hour. There are no restrictions for this procedure.    Follow-Up: At [facility], you and your health needs are our priority.  As part of our continuing mission to provide you with exceptional heart care, we have created designated Provider Care Teams.  These Care Teams include your primary Cardiologist (physician) and Advanced Practice Providers (APPs -  Physician Assistants and Nurse Practitioners) who all work together to provide you with the care you need, when you need it.  Your next appointment:   Follow up after echocardiogram has been done   The format for your next appointment:   In Person  Provider:    You may see Kate Sable, MD or one of the following Advanced Practice Providers on your designated Care Team:    Murray Hodgkins, NP  Christell Faith, PA-C  Marrianne Mood, PA-C

## 2019-03-29 ENCOUNTER — Ambulatory Visit (INDEPENDENT_AMBULATORY_CARE_PROVIDER_SITE_OTHER): Payer: Medicare HMO | Admitting: General Practice

## 2019-03-29 ENCOUNTER — Telehealth: Payer: Self-pay | Admitting: General Practice

## 2019-03-29 ENCOUNTER — Telehealth: Payer: Medicare HMO

## 2019-03-29 DIAGNOSIS — J449 Chronic obstructive pulmonary disease, unspecified: Secondary | ICD-10-CM

## 2019-03-29 DIAGNOSIS — M7989 Other specified soft tissue disorders: Secondary | ICD-10-CM

## 2019-03-29 DIAGNOSIS — F332 Major depressive disorder, recurrent severe without psychotic features: Secondary | ICD-10-CM

## 2019-03-29 DIAGNOSIS — I10 Essential (primary) hypertension: Secondary | ICD-10-CM

## 2019-03-29 NOTE — Chronic Care Management (AMB) (Signed)
Chronic Care Management   Follow Up Note   03/29/2019 Name: Beth [name] MRN: [id number] DOB: 1945/04/02  Referred by: Mikey College, NP (Inactive) Reason for referral : Chronic Care Management (Follow up: Swelling in legs/HTN/COPD/Depression)   Beth [name] is a 74 y.o. year old female who is a primary care patient of Mikey College, NP (Inactive). The CCM team was consulted for assistance with chronic disease management and care coordination needs.    Review of patient status, including review of consultants reports, relevant laboratory and other test results, and collaboration with appropriate care team members and the patient's provider was performed as part of comprehensive patient evaluation and provision of chronic care management services.    SDOH (Social Determinants of Health) assessments performed: Yes See Care Plan activities for detailed interventions related to [facility])     Outpatient Encounter Medications as of 03/29/2019  Medication Sig  . acetaminophen (TYLENOL) 325 MG tablet Take 650 mg by mouth every 4 (four) hours as needed.  Marland Kitchen albuterol (VENTOLIN HFA) 108 (90 Base) MCG/ACT inhaler Inhale 1-2 puffs into the lungs every 6 (six) hours as needed for wheezing or shortness of breath. Use with spacer  . ARIPiprazole (ABILIFY) 10 MG tablet Take 10 mg by mouth at bedtime.  Marland Kitchen aspirin 81 MG chewable tablet Chew 81 mg daily by mouth.  Marland Kitchen atorvastatin (LIPITOR) 40 MG tablet TAKE 1 TABLET BY MOUTH EVERY DAY  . azelastine (ASTELIN) 0.1 % nasal spray Place 2 sprays into both nostrils 2 (two) times daily.  . benzonatate (TESSALON) 200 MG capsule Take one cap TID PRN cough  . BREO ELLIPTA 100-25 MCG/INH AEPB INHALE 1 PUFF INTO THE LUNGS DAILY  . clonazePAM (KLONOPIN) 0.5 MG tablet TK 1 T PO DAILY PRN  . COMBIVENT RESPIMAT 20-100 MCG/ACT AERS respimat INHALE 1 PUFF BY MOUTH EVERY 6 HOURS  . DULoxetine (CYMBALTA) 20 MG capsule Take 2 capsules (40 mg total) by mouth  daily.  Marland Kitchen FLUoxetine (PROZAC) 20 MG tablet Take 60 mg by mouth daily.   . furosemide (LASIX) 20 MG tablet Take 1 tablet (20 mg total) by mouth daily.  Marland Kitchen gabapentin (NEURONTIN) 300 MG capsule Take 2 capsules (600 mg total) by mouth 3 (three) times daily.  Marland Kitchen loratadine (CLARITIN) 10 MG tablet Take 1 tablet (10 mg total) by mouth daily.  . Melatonin 3 MG CAPS Take 12 mg by mouth at bedtime as needed (sleep).  . meloxicam (MOBIC) 7.5 MG tablet Take 7.5 mg by mouth daily.   . metoprolol succinate (TOPROL-XL) 25 MG 24 hr tablet TAKE 1/2 TABLET(12.5 MG) BY MOUTH DAILY  . montelukast (SINGULAIR) 10 MG tablet TAKE 1 TABLET(10 MG) BY MOUTH AT BEDTIME  . SEROQUEL 100 MG tablet Take 100 mg by mouth at bedtime.   Facility-Administered Encounter Medications as of 03/29/2019  Medication  . albuterol (PROVENTIL) (2.5 MG/3ML) 0.083% nebulizer solution 2.5 mg     Objective:  BP Readings from Last 3 Encounters:  03/29/19 107/66  03/25/19 124/64  03/23/19 (!) 119/52   Wt Readings from Last 3 Encounters:  03/29/19 160 lb (72.6 kg)  03/25/19 168 lb (76.2 kg)  03/23/19 168 lb 12.8 oz (76.6 kg)    Goals Addressed            This Visit's Progress   . RNCM: I have a lot of health issues going on       [place] (see longtitudinal plan of care for additional care plan information)  Current Barriers:  . Chronic Disease Management support, education, and care coordination needs related to HTN, COPD, Depression, and Bilateral leg swelling  . Financial barriers . Mobility barriers   Clinical Goal(s) related to HTN, COPD, Depression, and bilateral leg swelling :  Over the next 90 days, patient will:  . Work with the care management team to address educational, disease management, and care coordination needs  . Begin or continue self health monitoring activities as directed today Measure and record blood pressure 5 times per week, Measure and record weight daily, and wear compression stockings to  help with bilateral leg swelling  . Call provider office for new or worsened signs and symptoms Blood pressure findings outside established parameters, Weight outside established parameters, Shortness of breath, and New or worsened symptom related to bilateral leg swelling or depression . Call care management team with questions or concerns . Verbalize basic understanding of patient centered plan of care established today  Interventions related to HTN, COPD, Depression, and Bilateral lower leg swelling :  . Evaluation of current treatment plans and patient's adherence to plan as established by provider . Assessed patient understanding of disease states . Assessed patient's education and care coordination needs . Provided the patient with contact information  . Assessed for urgent cardiology appointment: The patient has completed an appointment with [facility] the patient to have an ECHO cardiogram, was started on Lasix- states she lost 8 pounds . Assessed for device needs. The patient does not have a blood pressure cuff or a scale to monitor blood pressure or weights. Review of resources available. Completed- the patient now has a blood pressure cuff and scale and is using daily. Today's weight is 160 and her blood pressure with pulse is 107/66, p-77 . Email to clinical administrator on help for obtaining blood pressure cuff and scale. It will be ordered tomorrow from [employer] and shipped directly to the patient. -Completed  . Provided disease specific education to patient: review of wearing of compression stockings (currently the patient is using compression stockings on bilateral legs.  Assessment reveals that the patient does not have any weeping from the legs or sores), elevation of feet and legs when sitting, the benefit of using blood pressure cuff to take daily readings- the patient is now checking blood pressure daily and recording- Today's is 107/66, and the benefit of using a scale  to record daily weights- patient is doing daily weights and today is 160. Review of notification to the pcp/card for weight gain or loss +/- 3 pounds in one day or +/- 5 pounds in one week. The patient verbalized understanding.  Nash Dimmer with appropriate clinical care team members regarding patient needs: Pharmacy referral in place, LCSW referral in place. The CCM team to assist with meeting the patients health and wellness needs and goals by working with the pcp and interdisciplinary team.  Patient Self Care Activities related to HTN, COPD, Depression, and bilateral leg swelling :  . Patient is unable to independently self-manage chronic health conditions  Please see past updates related to this goal by clicking on the "Past Updates" button in the selected goal          Plan:   Telephone follow up appointment with care management team member scheduled for: 05-17-2019 at [place], MSN, [place] Mobile: [phone]

## 2019-03-29 NOTE — Patient Instructions (Signed)
Visit Information  Goals Addressed            This Visit's Progress   . RNCM: I have a lot of health issues going on       [place] (see longtitudinal plan of care for additional care plan information)  Current Barriers:  . Chronic Disease Management support, education, and care coordination needs related to HTN, COPD, Depression, and Bilateral leg swelling  . Financial barriers . Mobility barriers   Clinical Goal(s) related to HTN, COPD, Depression, and bilateral leg swelling :  Over the next 90 days, patient will:  . Work with the care management team to address educational, disease management, and care coordination needs  . Begin or continue self health monitoring activities as directed today Measure and record blood pressure 5 times per week, Measure and record weight daily, and wear compression stockings to help with bilateral leg swelling  . Call provider office for new or worsened signs and symptoms Blood pressure findings outside established parameters, Weight outside established parameters, Shortness of breath, and New or worsened symptom related to bilateral leg swelling or depression . Call care management team with questions or concerns . Verbalize basic understanding of patient centered plan of care established today  Interventions related to HTN, COPD, Depression, and Bilateral lower leg swelling :  . Evaluation of current treatment plans and patient's adherence to plan as established by provider . Assessed patient understanding of disease states . Assessed patient's education and care coordination needs . Provided the patient with contact information  . Assessed for urgent cardiology appointment: The patient has completed an appointment with [facility] the patient to have an ECHO cardiogram, was started on Lasix- states she lost 8 pounds . Assessed for device needs. The patient does not have a blood pressure cuff or a scale to monitor blood pressure or  weights. Review of resources available. Completed- the patient now has a blood pressure cuff and scale and is using daily. Today's weight is 160 and her blood pressure with pulse is 107/66, p-77 . Email to clinical administrator on help for obtaining blood pressure cuff and scale. It will be ordered tomorrow from [employer] and shipped directly to the patient. -Completed  . Provided disease specific education to patient: review of wearing of compression stockings (currently the patient is using compression stockings on bilateral legs.  Assessment reveals that the patient does not have any weeping from the legs or sores), elevation of feet and legs when sitting, the benefit of using blood pressure cuff to take daily readings- the patient is now checking blood pressure daily and recording- Today's is 107/66, and the benefit of using a scale to record daily weights- patient is doing daily weights and today is 160. Review of notification to the pcp/card for weight gain or loss +/- 3 pounds in one day or +/- 5 pounds in one week. The patient verbalized understanding.  Nash Dimmer with appropriate clinical care team members regarding patient needs: Pharmacy referral in place, LCSW referral in place. The CCM team to assist with meeting the patients health and wellness needs and goals by working with the pcp and interdisciplinary team.  Patient Self Care Activities related to HTN, COPD, Depression, and bilateral leg swelling :  . Patient is unable to independently self-manage chronic health conditions  Please see past updates related to this goal by clicking on the "Past Updates" button in the selected goal         Patient verbalizes  understanding of instructions provided today.   Telephone follow up appointment with care management team member scheduled for: 05-17-2019 at [place], MSN, [place] Mobile:  [phone]

## 2019-04-01 ENCOUNTER — Ambulatory Visit: Payer: Self-pay | Admitting: Licensed Clinical Social Worker

## 2019-04-01 DIAGNOSIS — F332 Major depressive disorder, recurrent severe without psychotic features: Secondary | ICD-10-CM

## 2019-04-01 DIAGNOSIS — I10 Essential (primary) hypertension: Secondary | ICD-10-CM

## 2019-04-01 DIAGNOSIS — J449 Chronic obstructive pulmonary disease, unspecified: Secondary | ICD-10-CM

## 2019-04-01 NOTE — Chronic Care Management (AMB) (Signed)
Chronic Care Management    Clinical Social Work Follow Up Note  04/01/2019 Name: Beth [name] MRN: [id number] DOB: 09-13-1945  Beth [name] is a 74 y.o. year old female who is a primary care patient of Mikey College, NP (Inactive). The CCM team was consulted for assistance with Mental Health Counseling and Resources.   Review of patient status, including review of consultants reports, other relevant assessments, and collaboration with appropriate care team members and the patient's provider was performed as part of comprehensive patient evaluation and provision of chronic care management services.    SDOH (Social Determinants of Health) assessments performed: Yes    Outpatient Encounter Medications as of 04/01/2019  Medication Sig  . acetaminophen (TYLENOL) 325 MG tablet Take 650 mg by mouth every 4 (four) hours as needed.  Marland Kitchen albuterol (VENTOLIN HFA) 108 (90 Base) MCG/ACT inhaler Inhale 1-2 puffs into the lungs every 6 (six) hours as needed for wheezing or shortness of breath. Use with spacer  . ARIPiprazole (ABILIFY) 10 MG tablet Take 10 mg by mouth at bedtime.  Marland Kitchen aspirin 81 MG chewable tablet Chew 81 mg daily by mouth.  Marland Kitchen atorvastatin (LIPITOR) 40 MG tablet TAKE 1 TABLET BY MOUTH EVERY DAY  . azelastine (ASTELIN) 0.1 % nasal spray Place 2 sprays into both nostrils 2 (two) times daily.  . benzonatate (TESSALON) 200 MG capsule Take one cap TID PRN cough  . BREO ELLIPTA 100-25 MCG/INH AEPB INHALE 1 PUFF INTO THE LUNGS DAILY  . clonazePAM (KLONOPIN) 0.5 MG tablet TK 1 T PO DAILY PRN  . COMBIVENT RESPIMAT 20-100 MCG/ACT AERS respimat INHALE 1 PUFF BY MOUTH EVERY 6 HOURS  . DULoxetine (CYMBALTA) 20 MG capsule Take 2 capsules (40 mg total) by mouth daily.  Marland Kitchen FLUoxetine (PROZAC) 20 MG tablet Take 60 mg by mouth daily.   . furosemide (LASIX) 20 MG tablet Take 1 tablet (20 mg total) by mouth daily.  Marland Kitchen gabapentin (NEURONTIN) 300 MG capsule Take 2 capsules (600 mg total) by mouth 3  (three) times daily.  Marland Kitchen loratadine (CLARITIN) 10 MG tablet Take 1 tablet (10 mg total) by mouth daily.  . Melatonin 3 MG CAPS Take 12 mg by mouth at bedtime as needed (sleep).  . meloxicam (MOBIC) 7.5 MG tablet Take 7.5 mg by mouth daily.   . metoprolol succinate (TOPROL-XL) 25 MG 24 hr tablet TAKE 1/2 TABLET(12.5 MG) BY MOUTH DAILY  . montelukast (SINGULAIR) 10 MG tablet TAKE 1 TABLET(10 MG) BY MOUTH AT BEDTIME  . SEROQUEL 100 MG tablet Take 100 mg by mouth at bedtime.   Facility-Administered Encounter Medications as of 04/01/2019  Medication  . albuterol (PROVENTIL) (2.5 MG/3ML) 0.083% nebulizer solution 2.5 mg     Goals Addressed    . SW: I have a lot of stress right now (pt-stated)       Current Barriers:  . Chronic Mental Health needs related to depression . Financial constraints related to managing health care expenses . Limited social support . ADL IADL limitations . Limited access to caregiver . Inability to perform ADL's independently . Suicidal Ideation/Homicidal Ideation: No  Clinical Social Work Goal(s):  Marland Kitchen Over the next 120 days, patient will work with SW  bi-monthly  by telephone or in person to reduce or manage symptoms related to depression . Over the next 120 days, patient will work with SW to address concerns related to decreasing depression and increasing self-care  Interventions: . Patient interviewed and appropriate assessments performed: brief mental health assessment .  Provided mental health counseling with regard to depression. Patient reports feeling " a little better" She shares that she is trying to manage her depression as best as she can. LCSW educated patient on coping methods to implement into her daily life to combat depressive symptoms and stress. Patient denied any suicidal or homicidal ideations. Encouraged patient to implement deep breathing exercises into her daily routine due to ongoing stress. Patient reports that she is actively working on both her  physical and mental health. She shares that her legs have decreased swelling since she started Lasix. Patient reports that she is using her compression stockings and elevating her legs as needed.  . Discussed plans with patient for ongoing care management follow up and provided patient with direct contact information for care management team . Provided education and assistance to client regarding Advanced Directives. . A voluntary and extensive discussion about advanced care planning including explanation and discussion of advanced was undertaken with the patient. Explanation regarding healthcare proxy and living will was reviewed and packet with forms with explanation of how to fill them out was given.   . Provided education to patient/caregiver about Hospice and/or Palliative Care services . Mindfulness or Relaxation Training provided during session for patient to implement when faced with future triggers  Patient Self Care Activities:  . Calls provider office for new concerns or questions . Ability for insight . Motivation for treatment  Patient Coping Strengths:  . Hopefulness . Self Advocate . Able to Communicate Effectively  Patient Self Care Deficits:  . Lacks social connections  Initial goal documentation     Follow Up Plan: SW will follow up with patient by phone over the next quarter  Eula Fried, [place], [email or web address] Phone: [phone]

## 2019-04-02 ENCOUNTER — Other Ambulatory Visit: Payer: Self-pay | Admitting: Family Medicine

## 2019-04-02 ENCOUNTER — Ambulatory Visit: Payer: Medicare HMO | Admitting: Pharmacist

## 2019-04-02 DIAGNOSIS — J302 Other seasonal allergic rhinitis: Secondary | ICD-10-CM

## 2019-04-02 DIAGNOSIS — F332 Major depressive disorder, recurrent severe without psychotic features: Secondary | ICD-10-CM

## 2019-04-02 DIAGNOSIS — I10 Essential (primary) hypertension: Secondary | ICD-10-CM

## 2019-04-02 DIAGNOSIS — J449 Chronic obstructive pulmonary disease, unspecified: Secondary | ICD-10-CM

## 2019-04-02 DIAGNOSIS — R69 Illness, unspecified: Secondary | ICD-10-CM | POA: Diagnosis not present

## 2019-04-02 DIAGNOSIS — J3089 Other allergic rhinitis: Secondary | ICD-10-CM

## 2019-04-02 NOTE — Chronic Care Management (AMB) (Signed)
Chronic Care Management   Note  04/02/2019 Name: Beth [name] MRN: [id number] DOB: 1945/10/10   Subjective:   Beth [name] is a 74 y.o. year old female who is a primary care patient of Mikey College, NP (Inactive). The CM team was consulted for assistance with chronic disease management and care coordination. Patient has a past medical history including but not limited to CVA, COPD, hypertension, hyperlipidemia, depression and recent lower extremity edema.  I reached out to [name] by phone today.   Review of patient status, including review of consultants reports, laboratory and other test data, was performed as part of comprehensive evaluation and provision of chronic care management services.    Objective:  Lab Results  Component Value Date   CREATININE 0.95 (H) 03/23/2019   CREATININE 1.00 (H) 04/10/2018   CREATININE 0.70 09/08/2017  Calculated CrCl ~52 mL/min (based on adjusted body weight)  Lab Results  Component Value Date   HGBA1C 5.3 06/26/2017       Component Value Date/Time   CHOL 135 04/10/2018 1115   CHOL 118 03/23/2015 1536   CHOL 185 12/25/2012 1449   TRIG 50 04/10/2018 1115   TRIG 93 12/25/2012 1449   HDL 59 04/10/2018 1115   HDL 44 03/23/2015 1536   HDL 39 (L) 12/25/2012 1449   CHOLHDL 2.3 04/10/2018 1115   VLDL 18 03/21/2016 0936   VLDL 19 12/25/2012 1449   LDLCALC 64 04/10/2018 1115   LDLCALC 127 (H) 12/25/2012 1449    BP Readings from Last 3 Encounters:  03/29/19 107/66  03/25/19 124/64  03/23/19 (!) 119/52    Allergies  Allergen Reactions  . Bextra  [Valdecoxib]   . Compazine [Prochlorperazine Edisylate]     Stroke-like symptoms  . Lithium Carbonate     Leg weakness  . Lyrica [Pregabalin]     Medications Reviewed Today    Reviewed by Vella Raring, RPH (Pharmacist) on 04/02/19 at 1004  Med List Status: <None>  Medication Order Taking? Sig Documenting Provider Last Dose Status Informant  acetaminophen  (TYLENOL) 500 MG tablet [id number] Yes Take 500 mg by mouth 2 (two) times daily as needed.  [provider] Taking Active Pharmacy Records  albuterol (PROVENTIL) (2.5 MG/3ML) 0.083% nebulizer solution 2.5 mg [id number]   Fredderick Severance, NP  Active   albuterol (VENTOLIN HFA) 108 (90 Base) MCG/ACT inhaler [id number] Yes Inhale 1-2 puffs into the lungs every 6 (six) hours as needed for wheezing or shortness of breath. Use with spacer Lorin Picket, PA-C Taking Active   aspirin 81 MG chewable tablet [id number] Yes Chew 81 mg daily by mouth. [provider] Taking Active Pharmacy Records           Med Note Dell Ponto Dec 18, 2016  4:14 PM)    atorvastatin (LIPITOR) 40 MG tablet [id number] Yes TAKE 1 TABLET BY MOUTH EVERY DAY [name], DO Taking Active   azelastine (ASTELIN) 0.1 % nasal spray [id number] Yes Place 2 sprays into both nostrils 2 (two) times daily. Mikey College, NP Taking Active   BREO ELLIPTA 100-25 MCG/INH AEPB [id number] Yes INHALE 1 PUFF INTO THE LUNGS DAILY Parks Ranger, [name], DO Taking Active   clonazePAM (KLONOPIN) 0.5 MG tablet [id number] Yes TK 1 T PO DAILY PRN [provider] Taking Active   COMBIVENT RESPIMAT 20-100 MCG/ACT AERS respimat [id number] Yes INHALE 1 PUFF BY MOUTH EVERY 6 HOURS Karamalegos, [name], DO Taking Active  DULoxetine (CYMBALTA) 20 MG capsule [id number] Yes Take 2 capsules (40 mg total) by mouth daily. [name], MD Taking Active   FLUoxetine (PROZAC) 20 MG tablet [id number] Yes Take 60 mg by mouth daily.  [provider] Taking Active   furosemide (LASIX) 20 MG tablet [id number] Yes Take 1 tablet (20 mg total) by mouth daily. Kate Sable, MD Taking Active   gabapentin (NEURONTIN) 300 MG capsule [id number] Yes Take 2 capsules (600 mg total) by mouth 3 (three) times daily. [name], MD Taking Active   loratadine (CLARITIN) 10 MG tablet [id number] No Take 1 tablet (10 mg  total) by mouth daily.  Patient not taking: Reported on 04/02/2019   Mikey College, NP Not Taking Active   Melatonin 3 MG CAPS [id number] Yes Take 10 mg by mouth at bedtime as needed (sleep).  [provider] Taking Active Self        Discontinued 04/02/19 1003 (No longer needed (for PRN medications))   metoprolol succinate (TOPROL-XL) 25 MG 24 hr tablet [id number] Yes TAKE 1/2 TABLET(12.5 MG) BY MOUTH DAILY Parks Ranger, [name], DO Taking Active   montelukast (SINGULAIR) 10 MG tablet [id number] Yes TAKE 1 TABLET(10 MG) BY MOUTH AT BEDTIME [name], DO Taking Active   SEROQUEL 100 MG tablet [id number] Yes Take 100 mg by mouth at bedtime. [provider] Taking Active   Med List Note Rise Patience, RN 02/12/18 1055): Medication agreement signed 02/12/2018 UDS 02/24/17 03/25/17 PA requested for gabapentin sent SB            Assessment:   Goals Addressed            This Visit's Progress   . PharmD - Medication Management       CARE PLAN ENTRY (see longtitudinal plan of care for additional care plan information)   Current Barriers:  . Chronic Disease Management support, education, and care coordination needs related to previous CVA, COPD, hypertension, hyperlipidemia, depression  Pharmacist Clinical Goal(s):  Marland Kitchen Over the next 30 days, patient will work with CM Pharmacist to complete medication review address needs identified  Interventions: . Comprehensive medication review performed; medication list updated in electronic medical record o Caution for risk of sedation and dizziness with clonazepam, gabapentin and quetiapine, particularly in combination. Patient verbalizes understanding; denies dizziness or sedation o Note patient taking both fluoxetine and duloxetine - combination may result in increased serum concentration of duloxetine and increased risk of serotonin syndrome - From chart, note patient has been on this combination at  current doses > 4 months o Note patient's psychiatric medications, including fluoxetine, managed by Psychiatrist, Dr. Hampton Abbot. Duloxetine prescribed by pain management provider  . Patient reports that she is needing to find new Psychiatrist for future as current provider not in [employer] [place] [employer]. Reports [employer] representative helping patient to connect patient with a new provider in-network o Reports last virtual visit with Dr. Marquita Palms 2 weeks ago. o Encourage patient to call [facility] again today to follow up  . Counsel patient on maintenance vs rescue inhaler. Counsel patient on rinsing mouth out after each use of Breo. . Reports not taking loratadine as did not find to be effective for allergy symptoms o Reports may retry loratadine or alternative OTC 2nd generation antihistamine (cetirizine, fexofenadine), for allergy symptom control - Caution to watch for sedation with start o Note patient has also tried fluticasone nasal spray without relief . Counsel on importance of blood pressure monitoring and control o Reports checking  BP at home, yesterday: 114/66, HR 89 . Counsel on importance of continuing to check daily weights and notifying provider for weight gain or loss +/- 3 pounds in one day or +/- 5 pounds in one week. The patient verbalized understanding. . Mail patient BP & daily weight log as well as education on BP monitoring . Counsel on importance of medication adherence.  o Denies medication cost as a barrier o Encourage patient to obtain a weekly pillbox . Place coordination of care call to Dr. Vance Peper office, [facility] regarding combination SSRI & SNRI therapy. Leave a message with Janett Billow in office.  Patient Self Care Activities:  . To self administers medications as prescribed o Encourage patient to obtain weekly pillbox to use as adherence tool . Attends all scheduled provider appointments o Next appointment with Pain Clinic on 4/6 o Next appointment  with Cardiology on 4/8 . Calls pharmacy for medication refills . Calls provider office for new concerns or questions  Initial goal documentation        Plan:  The care management team will reach out to the patient again over the next month.  [name], PharmD, [place] [employer] [phone]

## 2019-04-02 NOTE — Patient Instructions (Signed)
Thank you allowing the Chronic Care Management Team to be a part of your care! It was a pleasure speaking with you today!     CCM (Chronic Care Management) Team    [name] RN, MSN, CCM Nurse Care Coordinator  [phone]   [name] PharmD  Clinical Pharmacist  [phone]   Eula Fried LCSW Clinical Social Worker [phone]  Visit Information  Goals Addressed            This Visit's Progress   . PharmD - Medication Management       CARE PLAN ENTRY (see longtitudinal plan of care for additional care plan information)   Current Barriers:  . Chronic Disease Management support, education, and care coordination needs related to previous CVA, COPD, hypertension, hyperlipidemia, depression  Pharmacist Clinical Goal(s):  Marland Kitchen Over the next 30 days, patient will work with CM Pharmacist to complete medication review address needs identified  Interventions: . Comprehensive medication review performed; medication list updated in electronic medical record o Caution for risk of sedation and dizziness with clonazepam, gabapentin and quetiapine, particularly in combination. Patient verbalizes understanding; denies dizziness or sedation o Note patient taking both fluoxetine and duloxetine - combination may result in increased serum concentration of duloxetine and increased risk of serotonin syndrome - From chart, note patient has been on this combination at current doses > 4 months o Note patient's psychiatric medications, including fluoxetine, managed by Psychiatrist, Dr. Hampton Abbot. Duloxetine prescribed by pain management provider  . Patient reports that she is needing to find new Psychiatrist for future as current provider not in [employer]. Reports [employer] representative helping patient to connect patient with a new provider in-network o Reports last virtual visit with Dr. Marquita Palms 2 weeks ago. o Encourage patient to call [place] [facility] again today to follow up   . Counsel patient on maintenance vs rescue inhaler. Counsel patient on rinsing mouth out after each use of Breo. . Reports not taking loratadine as did not find to be effective for allergy symptoms o Reports may retry loratadine or alternative OTC 2nd generation antihistamine (cetirizine, fexofenadine), for allergy symptom control - Caution to watch for sedation with start o Note patient has also tried fluticasone nasal spray without relief . Counsel on importance of blood pressure monitoring and control o Reports checking BP at home, yesterday: 114/66, HR 89 . Counsel on importance of continuing to check daily weights and notifying provider for weight gain or loss +/- 3 pounds in one day or +/- 5 pounds in one week. The patient verbalized understanding. . Mail patient BP & daily weight log as well as education on BP monitoring . Counsel on importance of medication adherence.  o Denies medication cost as a barrier o Encourage patient to obtain a weekly pillbox . Place coordination of care call to Dr. Vance Peper office, [facility] regarding combination SSRI & SNRI therapy. Leave a message with Janett Billow in office.  Patient Self Care Activities:  . To self administers medications as prescribed o Encourage patient to obtain weekly pillbox to use as adherence tool . Attends all scheduled provider appointments o Next appointment with Pain Clinic on 4/6 o Next appointment with Cardiology on 4/8 . Calls pharmacy for medication refills . Calls provider office for new concerns or questions  Initial goal documentation        Patient verbalizes understanding of instructions provided today.   The care management team will reach out to the patient again over the next month  [name], PharmD, [place] [employer] [phone]

## 2019-04-05 ENCOUNTER — Ambulatory Visit: Payer: Self-pay | Admitting: Pharmacist

## 2019-04-05 DIAGNOSIS — F332 Major depressive disorder, recurrent severe without psychotic features: Secondary | ICD-10-CM

## 2019-04-05 NOTE — Chronic Care Management (AMB) (Signed)
Chronic Care Management   Follow Up Note   04/05/2019 Name: Beth [name] MRN: [id number] DOB: 09-29-1945  Referred by: Mikey College, NP (Inactive) Reason for referral : Care Coordination (Provider collaboration)   Beth [name] is a 74 y.o. year old female who is a primary care patient of Mikey College, NP (Inactive). The CCM team was consulted for assistance with chronic disease management and care coordination needs.  Patient has a past medical history including but not limited to CVA, COPD, hypertension, hyperlipidemia, depression and recent lower extremity edema.  Coordination of care call back from [facility].  Review of patient status, including review of consultants reports, relevant laboratory and other test results, and collaboration with appropriate care team members and the patient's provider was performed as part of comprehensive patient evaluation and provision of chronic care management services.     Outpatient Encounter Medications as of 04/05/2019  Medication Sig  . acetaminophen (TYLENOL) 500 MG tablet Take 500 mg by mouth 2 (two) times daily as needed.   Marland Kitchen albuterol (VENTOLIN HFA) 108 (90 Base) MCG/ACT inhaler Inhale 1-2 puffs into the lungs every 6 (six) hours as needed for wheezing or shortness of breath. Use with spacer  . aspirin 81 MG chewable tablet Chew 81 mg daily by mouth.  Marland Kitchen atorvastatin (LIPITOR) 40 MG tablet TAKE 1 TABLET BY MOUTH EVERY DAY  . azelastine (ASTELIN) 0.1 % nasal spray Place 2 sprays into both nostrils 2 (two) times daily.  Marland Kitchen BREO ELLIPTA 100-25 MCG/INH AEPB INHALE 1 PUFF INTO THE LUNGS DAILY  . clonazePAM (KLONOPIN) 0.5 MG tablet TK 1 T PO DAILY PRN  . COMBIVENT RESPIMAT 20-100 MCG/ACT AERS respimat INHALE 1 PUFF BY MOUTH EVERY 6 HOURS  . DULoxetine (CYMBALTA) 20 MG capsule Take 2 capsules (40 mg total) by mouth daily.  Marland Kitchen FLUoxetine (PROZAC) 20 MG tablet Take 60 mg by mouth daily.   . furosemide (LASIX)  20 MG tablet Take 1 tablet (20 mg total) by mouth daily.  Marland Kitchen gabapentin (NEURONTIN) 300 MG capsule Take 2 capsules (600 mg total) by mouth 3 (three) times daily.  Marland Kitchen loratadine (CLARITIN) 10 MG tablet Take 1 tablet (10 mg total) by mouth daily. (Patient not taking: Reported on 04/02/2019)  . Melatonin 3 MG CAPS Take 10 mg by mouth at bedtime as needed (sleep).   . metoprolol succinate (TOPROL-XL) 25 MG 24 hr tablet TAKE 1/2 TABLET(12.5 MG) BY MOUTH DAILY  . montelukast (SINGULAIR) 10 MG tablet TAKE 1 TABLET(10 MG) BY MOUTH AT BEDTIME  . SEROQUEL 100 MG tablet Take 100 mg by mouth at bedtime.   Facility-Administered Encounter Medications as of 04/05/2019  Medication  . albuterol (PROVENTIL) (2.5 MG/3ML) 0.083% nebulizer solution 2.5 mg    Goals Addressed            This Visit's Progress   . PharmD - Medication Management       CARE PLAN ENTRY (see longtitudinal plan of care for additional care plan information)   Current Barriers:  . Chronic Disease Management support, education, and care coordination needs related to previous CVA, COPD, hypertension, hyperlipidemia, depression  Pharmacist Clinical Goal(s):  Marland Kitchen Over the next 30 days, patient will work with CM Pharmacist to complete medication review address needs identified  Interventions: . Receive coordination of care call back from Dr. Vance Peper office, [facility] regarding combination SSRI & SNRI therapy from [place] who reports per Dr. Marquita Palms has followed up with patient regarding interaction between fluoxetine  and duloxetine, advised her on discontinuing duloxetine. o Request clarification on whether provider advised patient to taper down duloxetine as per chart, patient had been on dose > 1 month. States that she will clarify with Dr. Marquita Palms and reach back out directly to patient with provider's instruction.  Patient Self Care Activities:  . To self administers medications as prescribed o Encourage patient to obtain  weekly pillbox to use as adherence tool . Attends all scheduled provider appointments o Next appointment with Pain Clinic on 4/6 o Next appointment with Cardiology on 4/8 . Calls pharmacy for medication refills . Calls provider office for new concerns or questions  Please see past updates related to this goal by clicking on the "Past Updates" button in the selected goal         Plan  The care management team will reach out to the patient again over the next 30 days.   [name], PharmD, [place] [employer] [phone]

## 2019-04-07 ENCOUNTER — Telehealth: Payer: Self-pay

## 2019-04-07 NOTE — Telephone Encounter (Signed)
The pt was called and notified that the paperwork is ready for pick up. I asked her if we need to fax the paperwork and she said no, because she have to finish filling out her part.

## 2019-04-07 NOTE — Telephone Encounter (Signed)
Completed and in my outbox for faxing.  It should be 2 pages, paperclipped together with a fax number on a yellow sticky note.  Thanks

## 2019-04-07 NOTE — Telephone Encounter (Signed)
  The patient called requesting the status of the paperwork she dropped of in the office. Please advise

## 2019-04-10 ENCOUNTER — Other Ambulatory Visit: Payer: Self-pay | Admitting: Family Medicine

## 2019-04-10 DIAGNOSIS — I69359 Hemiplegia and hemiparesis following cerebral infarction affecting unspecified side: Secondary | ICD-10-CM

## 2019-04-10 DIAGNOSIS — E785 Hyperlipidemia, unspecified: Secondary | ICD-10-CM

## 2019-04-19 ENCOUNTER — Telehealth: Payer: Self-pay

## 2019-04-28 DIAGNOSIS — R69 Illness, unspecified: Secondary | ICD-10-CM | POA: Diagnosis not present

## 2019-04-28 DIAGNOSIS — F332 Major depressive disorder, recurrent severe without psychotic features: Secondary | ICD-10-CM | POA: Diagnosis not present

## 2019-04-29 ENCOUNTER — Ambulatory Visit (INDEPENDENT_AMBULATORY_CARE_PROVIDER_SITE_OTHER): Payer: Medicare HMO

## 2019-04-29 ENCOUNTER — Other Ambulatory Visit: Payer: Self-pay

## 2019-04-29 DIAGNOSIS — M7989 Other specified soft tissue disorders: Secondary | ICD-10-CM | POA: Diagnosis not present

## 2019-04-29 DIAGNOSIS — I517 Cardiomegaly: Secondary | ICD-10-CM

## 2019-04-29 DIAGNOSIS — I700 Atherosclerosis of aorta: Secondary | ICD-10-CM

## 2019-04-29 DIAGNOSIS — R011 Cardiac murmur, unspecified: Secondary | ICD-10-CM | POA: Diagnosis not present

## 2019-05-03 NOTE — Progress Notes (Signed)
Today's VV is to review Gabapentin. She states that it is helping her but would like to discuss new onset of pain to her left arm. Ms Madriaga fell down several years ago and broke her left elbow and the pain is now coming back radiating up and down her entire arm.

## 2019-05-04 ENCOUNTER — Ambulatory Visit
Payer: Medicare HMO | Attending: Student in an Organized Health Care Education/Training Program | Admitting: Student in an Organized Health Care Education/Training Program

## 2019-05-04 ENCOUNTER — Other Ambulatory Visit: Payer: Self-pay

## 2019-05-04 ENCOUNTER — Telehealth: Payer: Self-pay | Admitting: *Deleted

## 2019-05-04 ENCOUNTER — Encounter: Payer: Self-pay | Admitting: Student in an Organized Health Care Education/Training Program

## 2019-05-04 DIAGNOSIS — G894 Chronic pain syndrome: Secondary | ICD-10-CM | POA: Insufficient documentation

## 2019-05-04 DIAGNOSIS — M47816 Spondylosis without myelopathy or radiculopathy, lumbar region: Secondary | ICD-10-CM | POA: Diagnosis not present

## 2019-05-04 DIAGNOSIS — M25512 Pain in left shoulder: Secondary | ICD-10-CM | POA: Insufficient documentation

## 2019-05-04 DIAGNOSIS — M542 Cervicalgia: Secondary | ICD-10-CM | POA: Diagnosis not present

## 2019-05-04 DIAGNOSIS — M25522 Pain in left elbow: Secondary | ICD-10-CM | POA: Insufficient documentation

## 2019-05-04 MED ORDER — METHYLPREDNISOLONE 4 MG PO TBPK: ORAL_TABLET | ORAL | 0 refills | Status: AC

## 2019-05-04 NOTE — Progress Notes (Signed)
Patient: Beth [name]  Service Category: E/M  Provider: [name], MD  DOB: 1945-04-09  DOS: 05/04/2019  Location: Office  MRN: [id number]  Setting: Ambulatory outpatient  Referring Provider: Mikey College, *  Type: Established Patient  Specialty: Interventional Pain Management  PCP: Verl Bangs, FNP  Location: Home  Delivery: TeleHealth     Virtual Encounter - Pain Management PROVIDER NOTE: Information contained herein reflects review and annotations entered in association with encounter. Interpretation of such information and data should be left to medically-trained personnel. Information provided to patient can be located elsewhere in the medical record under "Patient Instructions". Document created using STT-dictation technology, any transcriptional errors that may result from process are unintentional.    Contact & Pharmacy Preferred: [phone] Home: [phone] (home) Mobile: [phone] (mobile) E-mail: No e-mail address on record  [address] [place] [address] Phone: [phone] Fax: [phone]   Pre-screening  Ms. Edelstein offered "in-person" vs "virtual" encounter. She indicated preferring virtual for this encounter.   Reason COVID-19*  Social distancing based on CDC and AMA recommendations.   I contacted Beth [name] on 05/04/2019 via telephone.      I clearly identified myself as [name], MD. I verified that I was speaking with the correct person using two identifiers (Name: Beth [name], and date of birth: [birth year]).  This visit was completed via telephone due to the restrictions of the COVID-19 pandemic. All issues as above were discussed and addressed but no physical exam was performed. If it was felt that the patient should be evaluated in the office, they were directed there. The patient verbally consented to this visit. Patient was  unable to complete an audio/visual visit due to Technical difficulties and/or Lack of internet. Due to the catastrophic nature of the COVID-19 pandemic, this visit was done through audio contact only.  Location of the patient: home address (see Epic for details)  Location of the provider: office  Consent I sought verbal advanced consent from Beth [name] for virtual visit interactions. I informed Ms. Hett of possible security and privacy concerns, risks, and limitations associated with providing "not-in-person" medical evaluation and management services. I also informed Ms. Caba of the availability of "in-person" appointments. Finally, I informed her that there would be a charge for the virtual visit and that she could be  personally, fully or partially, financially responsible for it. Ms. Minetti expressed understanding and agreed to proceed.   Historic Elements   Ms. DOUGLAS ROOKS is a 74 y.o. year old, female patient evaluated today after her last contact with our practice on Visit date not found. Ms. Hawbaker  has a past medical history of Allergy, Anxiety, Asthma, Chronic pain syndrome, COPD (chronic obstructive pulmonary disease) ([place]), CVA (cerebral infarction), Depression, Fibromyalgia, Headache, Hyperlipidemia, Hypertension, IBS (irritable bowel syndrome), Stroke ([place]), and Vitamin D deficiency. She also  has a past surgical history that includes Breast surgery (2011); Cervical discectomy; Appendectomy; Abdominal hysterectomy; Cholecystectomy; Sinusotomy; and Breast excisional biopsy (2011). Ms. Brisby has a current medication list which includes the following prescription(s): acetaminophen, albuterol, aspirin, atorvastatin, azelastine, breo ellipta, clonazepam, combivent respimat, duloxetine, gabapentin, loratadine, melatonin, metoprolol succinate, montelukast, seroquel, fluoxetine, furosemide, and methylprednisolone, and the following Facility-Administered Medications:  albuterol. She  reports that she quit smoking about 4 months ago. Her smoking use included cigarettes. She has a 26.25 pack-year smoking history.  She has never used smokeless tobacco. She reports that she does not drink alcohol or use drugs. Ms. Hay is allergic to bextra  [valdecoxib]; compazine [prochlorperazine edisylate]; lithium carbonate; and lyrica [pregabalin].   HPI  Today, she is being contacted for worsening of previously known (established) problem  new onset of pain to her left arm, started approx 1 month ago. Ms Pacifico fell down several years ago and broke her left elbow and the pain is now coming back radiating up and down her entire arm. Hx of left elbow surgery. No inciting event to explain increase left arm, elbow and shoulder pain.  He is also endorsing low back pain that is radiating to bilateral buttocks.  This is not radiating down to her legs.  Patient's previous lumbar facet medial branch nerve block was 09/03/2017 bilaterally at L3, L4, L5 which was very helpful for her low back pain related to lumbar spondylosis.  Patient would like to have this repeated.   Laboratory Chemistry Profile   Renal Lab Results  Component Value Date   BUN 22 03/23/2019   CREATININE 0.95 (H) 03/23/2019   BCR 23 (H) 03/23/2019   GFRAA 64 04/10/2018   GFRNONAA 55 (L) 04/10/2018     Hepatic Lab Results  Component Value Date   AST 15 03/23/2019   ALT 11 03/23/2019   ALBUMIN 4.0 07/15/2017   ALKPHOS 81 07/15/2017   LIPASE 27 02/13/2016     Electrolytes Lab Results  Component Value Date   NA 143 03/23/2019   K 4.8 03/23/2019   CL 107 03/23/2019   CALCIUM 9.6 03/23/2019   MG 1.8 12/25/2012     Bone Lab Results  Component Value Date   VD25OH 37 03/21/2016     Inflammation (CRP: Acute Phase) (ESR: Chronic Phase) Lab Results  Component Value Date   CRP 0.3 03/21/2016   ESRSEDRATE 1 03/21/2016   LATICACIDVEN 1.3 12/23/2015       Note: Above Lab results  reviewed.  Imaging  ECHOCARDIOGRAM COMPLETE    ECHOCARDIOGRAM REPORT       Patient Name:   ERIKAH THUMM Date of Exam: 04/29/2019 Medical Rec #:  [id number]        Height:       65.0 in Accession #:    [id number]       Weight:       160.0 lb Date of Birth:  1945/08/26        BSA:          1.799 m Patient Age: 74  63 years         BP:           124/64 mmHg Patient Gender: F                HR:           82 bpm. Exam Location:  [facility]  Procedure: 2D Echo, Cardiac Doppler and Color Doppler  Indications:    R01.1 Murmur   History:        Patient has no prior history of Echocardiogram examinations.                 Signs/Symptoms:Murmur; Risk Factors:Hypertension, Dyslipidemia                 and Former Smoker. Faint murmur heard, patient states she has                 felt weak for about 6 months. She also  has noticed some leg                 swelling.   Sonographer:    Salvadore Dom RVT, RDCS (AE), RDMS Referring Phys: [id number] BRIAN AGBOR-ETANG  IMPRESSIONS   1. Left ventricular ejection fraction, by estimation, is 60 to 65%. The left ventricle has normal function. The left ventricle has no regional wall motion abnormalities. Left ventricular diastolic parameters were normal.  2. Right ventricular systolic function is normal. The right ventricular size is normal. There is moderately elevated pulmonary artery systolic pressure.  3. Left atrial size was mildly dilated.  4. The mitral valve is grossly normal. Trivial mitral valve regurgitation.  5. The aortic valve is tricuspid. Aortic valve regurgitation is not visualized. Mild aortic valve sclerosis is present, with no evidence of aortic valve stenosis.  6. The inferior vena cava is normal in size with greater than 50% respiratory variability, suggesting right atrial pressure of 3 mmHg.  FINDINGS  Left Ventricle: Left ventricular ejection fraction, by estimation, is 60 to 65%. The left ventricle has normal function. The left  ventricle has no regional wall motion abnormalities. The left ventricular internal cavity size was normal in size. There is  no left ventricular hypertrophy. Left ventricular diastolic parameters were normal.  Right Ventricle: The right ventricular size is normal. No increase in right ventricular wall thickness. Right ventricular systolic function is normal. There is moderately elevated pulmonary artery systolic pressure. The tricuspid regurgitant velocity is  3.32 m/s, and with an assumed right atrial pressure of 3 mmHg, the estimated right ventricular systolic pressure is 67.2 mmHg.  Left Atrium: Left atrial size was mildly dilated.  Right Atrium: Right atrial size was normal in size.  Pericardium: There is no evidence of pericardial effusion.  Mitral Valve: The mitral valve is grossly normal. Mild mitral annular calcification. Trivial mitral valve regurgitation.  Tricuspid Valve: The tricuspid valve is normal in structure. Tricuspid valve regurgitation is mild.  Aortic Valve: The aortic valve is tricuspid. Aortic valve regurgitation is not visualized. Aortic regurgitation PHT measures 520 msec. Mild aortic valve sclerosis is present, with no evidence of aortic valve stenosis. Aortic valve mean gradient measures  4.0 mmHg. Aortic valve peak gradient measures 9.0 mmHg. Aortic valve area, by VTI measures 2.18 cm.  Pulmonic Valve: The pulmonic valve was normal in structure. Pulmonic valve regurgitation is not visualized.  Aorta: The aortic root is normal in size and structure.  Venous: The inferior vena cava is normal in size with greater than 50% respiratory variability, suggesting right atrial pressure of 3 mmHg.  IAS/Shunts: No atrial level shunt detected by color flow Doppler.    LEFT VENTRICLE PLAX 2D LVIDd:         4.20 cm     Diastology LVIDs:         2.25 cm     LV e' lateral:   10.70 cm/s LV PW:         0.90 cm     LV E/e' lateral: 10.6 LV IVS:        0.70 cm     LV e'  medial:    8.92 cm/s LVOT diam:     1.80 cm     LV E/e' medial:  12.7 LV SV:         63 LV SV Index:   35 LVOT Area:     2.54 cm   LV Volumes (MOD) LV vol d, MOD A2C: 64.1 ml LV vol d, MOD  A4C: 54.1 ml LV vol s, MOD A2C: 20.7 ml LV vol s, MOD A4C: 13.3 ml LV SV MOD A2C:     43.4 ml LV SV MOD A4C:     54.1 ml LV SV MOD BP:      43.1 ml  RIGHT VENTRICLE RV S prime:     16.30 cm/s TAPSE (M-mode): 3.0 cm  LEFT ATRIUM             Index       RIGHT ATRIUM           Index LA diam:        3.70 cm 2.06 cm/m  RA Area:     15.10 cm LA Vol (A2C):   69.0 ml 38.35 ml/m RA Volume:   39.10 ml  21.73 ml/m LA Vol (A4C):   72.8 ml 40.46 ml/m LA Biplane Vol: 72.9 ml 40.52 ml/m  AORTIC VALVE                   PULMONIC VALVE AV Area (Vmax):    1.85 cm    PV Vmax:       0.92 m/s AV Area (Vmean):   2.19 cm    PV Peak grad:  3.4 mmHg AV Area (VTI):     2.18 cm AV Vmax:           150.00 cm/s AV Vmean:          89.900 cm/s AV VTI:            0.290 m AV Peak Grad:      9.0 mmHg AV Mean Grad:      4.0 mmHg LVOT Vmax:         109.00 cm/s LVOT Vmean:        77.200 cm/s LVOT VTI:          0.249 m LVOT/AV VTI ratio: 0.86 AI PHT:            520 msec   AORTA Ao Root diam: 3.50 cm Ao Asc diam:  3.40 cm  MITRAL VALVE                TRICUSPID VALVE MV Area (PHT): 4.80 cm     TR Peak grad:   44.1 mmHg MV Decel Time: 158 msec     TR Vmax:        332.00 cm/s MR Peak grad: 102.5 mmHg MR Vmax:      506.33 cm/s   SHUNTS MV E velocity: 113.00 cm/s  Systemic VTI:  0.25 m MV A velocity: 82.50 cm/s   Systemic Diam: 1.80 cm MV E/A ratio:  1.37  Kate Sable MD Electronically signed by Kate Sable MD Signature Date/Time: 04/29/2019/5:28:07 PM      Final    Assessment  The primary encounter diagnosis was Lumbar facet arthropathy. Diagnoses of Lumbar spondylosis, Cervicalgia, Acute pain of left shoulder, Elbow pain, left, and Chronic pain syndrome were also pertinent to this  visit.  Plan of Care   [name] has a current medication list which includes the following long-term medication(s): albuterol, atorvastatin, azelastine, combivent respimat, duloxetine, gabapentin, loratadine, metoprolol succinate, montelukast, fluoxetine, and furosemide.  1. Lumbar facet arthropathy - methylPREDNISolone (MEDROL) 4 MG TBPK tablet; Follow package instructions.  Dispense: 21 tablet; Refill: 0 - LUMBAR FACET(MEDIAL BRANCH NERVE BLOCK) MBNB; Future - previously done 09/03/2017 bilaterally at L3, L4, L5.  Helped significantly.  2. Lumbar spondylosis - methylPREDNISolone (MEDROL) 4 MG TBPK tablet; Follow  package instructions.  Dispense: 21 tablet; Refill: 0 - LUMBAR FACET(MEDIAL BRANCH NERVE BLOCK) MBNB; Future  3. Cervicalgia - DG Cervical Spine Complete; Future  4. Acute pain of left shoulder - DG Shoulder Left; Future  5. Elbow pain, left - DG ELBOW COMPLETE LEFT (3+VIEW); Future  6. Chronic pain syndrome Management as above   Pharmacotherapy (Medications Ordered): Meds ordered this encounter  Medications  . methylPREDNISolone (MEDROL) 4 MG TBPK tablet    Sig: Follow package instructions.    Dispense:  21 tablet    Refill:  0    Do not add to the "Automatic Refill" notification system.   Orders:  Orders Placed This Encounter  Procedures  . LUMBAR FACET(MEDIAL BRANCH NERVE BLOCK) MBNB    Standing Status:   Future    Standing Expiration Date:   06/03/2019    Scheduling Instructions:     Procedure: Lumbar facet block (AKA.: Lumbosacral medial branch nerve block)     Side: Bilateral     Level: L3-4, L4-5, Facets (L3, L4, L5,Medial Branch Nerves)     Sedation: with     Timeframe: ASAA    Order Specific Question:   Where will this procedure be performed?    Answer:   [facility]  . DG Cervical Spine Complete    Patient presents with axial pain with possible radicular component.  In addition to any acute findings, please report on:  1.  Facet (Zygapophyseal) joint DJD (Hypertrophy, space narrowing, subchondral sclerosis, and/or osteophyte formation) 2. DDD and/or IVDD (Loss of disc height, desiccation or "Black disc disease") 3. Pars defects 4. Spondylolisthesis, spondylosis, and/or spondyloarthropathies (include Degree/Grade of displacement in mm) 5. Vertebral body Fractures, including age (old, new/acute) 62. Modic Type Changes 7. Demineralization 8. Bone pathology 9. Central, Lateral Recess, and/or Foraminal Stenosis (include AP diameter of stenosis in mm) 10. Surgical changes (hardware type, status, and presence of fibrosis) NOTE: Please specify level(s) and laterality.    Standing Status:   Future    Standing Expiration Date:   08/03/2019    Scheduling Instructions:     Please describe presence and specific location (Level & Laterality) of any signs of osteoarthritis, zygapophyseal (Facet) joints DJD (including decreased joint space and/or osteophytosis), DDD, Foraminal narrowing, as well as any sclerosis and/or cyst formation.    Order Specific Question:   Reason for Exam (SYMPTOM  OR DIAGNOSIS REQUIRED)    Answer:   Cervicalgia    Order Specific Question:   Preferred imaging location?    Answer:   [facility]    Order Specific Question:   Call Results- Best Contact Number?    Answer:   [phone] [facility])  . DG Shoulder Left    Standing Status:   Future    Standing Expiration Date:   07/03/2020    Order Specific Question:   Reason for Exam (SYMPTOM  OR DIAGNOSIS REQUIRED)    Answer:   Left shoulder pain    Order Specific Question:   Preferred imaging location?    Answer:   [facility]    Order Specific Question:   Call Results- Best Contact Number?    Answer:   [phone]  . DG ELBOW COMPLETE LEFT (3+VIEW)    Standing Status:   Future    Standing Expiration Date:   07/03/2020    Order Specific Question:   Reason for Exam (SYMPTOM  OR DIAGNOSIS REQUIRED)    Answer:   elbow pain    Order  Specific Question:  Preferred imaging location?    Answer:   [facility]    Order Specific Question:   Radiology Contrast Protocol - do NOT remove file path    Answer:   [email or web address]   Follow-up plan:   Return in about 2 weeks (around 05/18/2019) for B/L L3, 4, 5 , with sedation.     Status post bilateral facet medial branch nerve blocks L3, L4, L5 on 09/03/2017, helpful for axial low back and buttock pain: Repeat PRN.  Status post right L4-L5 epidural steroid injection on 03/09/2018, 07/29/2018 which was helpful for her right-sided hip and leg pain.  Right L4-L5 ESI #3 on 09/23/2027 not as helpful.     Recent Visits No visits were found meeting these conditions.  Showing recent visits within past 90 days and meeting all other requirements   Today's Visits Date Type Provider Dept  05/04/19 Office Visit [name], MD [facility]  Showing today's visits and meeting all other requirements   Future Appointments No visits were found meeting these conditions.  Showing future appointments within next 90 days and meeting all other requirements   I discussed the assessment and treatment plan with the patient. The patient was provided an opportunity to ask questions and all were answered. The patient agreed with the plan and demonstrated an understanding of the instructions.  Patient advised to call back or seek an in-person evaluation if the symptoms or condition worsens.  Duration of encounter: 68mnutes.  Note by: BGillis Santa MD Date: 05/04/2019; Time: 8:57 AM

## 2019-05-06 ENCOUNTER — Ambulatory Visit (INDEPENDENT_AMBULATORY_CARE_PROVIDER_SITE_OTHER): Payer: Medicare HMO | Admitting: Cardiology

## 2019-05-06 ENCOUNTER — Encounter: Payer: Self-pay | Admitting: Cardiology

## 2019-05-06 ENCOUNTER — Other Ambulatory Visit: Payer: Self-pay

## 2019-05-06 VITALS — BP 92/60 | HR 79 | Ht 65.0 in | Wt 163.4 lb

## 2019-05-06 DIAGNOSIS — R0600 Dyspnea, unspecified: Secondary | ICD-10-CM

## 2019-05-06 DIAGNOSIS — R011 Cardiac murmur, unspecified: Secondary | ICD-10-CM | POA: Diagnosis not present

## 2019-05-06 DIAGNOSIS — I739 Peripheral vascular disease, unspecified: Secondary | ICD-10-CM

## 2019-05-06 DIAGNOSIS — I10 Essential (primary) hypertension: Secondary | ICD-10-CM | POA: Diagnosis not present

## 2019-05-06 DIAGNOSIS — R0609 Other forms of dyspnea: Secondary | ICD-10-CM

## 2019-05-06 NOTE — Progress Notes (Signed)
Cardiology Office Note:    Date:  05/06/2019   ID:  Beth [name], DOB 1945-08-01, MRN [id number]  PCP:  Verl Bangs, FNP  Cardiologist:  Kate Sable, MD  Electrophysiologist:  None   Referring MD: No ref. provider found   Chief Complaint  Patient presents with  . other    Echo follow up. Meds reviewed by the pt. verbally. Pt. c/o bilateral leg pain with and without walking, left arm pain, LE edema, chest tightness and shortness of breath.     History of Present Illness:    Beth [name] is a 74 y.o. female with a hx of asthma, CVA, COPD, former smoker, hypertension, hyperlipidemia who presents for follow-up.  She was last seen due to 6 months history of lower extremity edema.  Symptoms typically worsen throughout the day.  She also has a history of heart murmur, faint systolic murmur noted on clinical exam.  Echocardiogram ordered.  She denies any symptoms of shortness of breath palpitations or chest pain.  Empiric Lasix 20 mg daily was started for edema which has improved her symptoms.  She now states having shortness of breath and some chest discomfort going on for the past several weeks.  Also describes leg pain/cough pain when she walks.  Pain improves with rest.  Ongoing for 3 to 4 months.  Past Medical History:  Diagnosis Date  . Allergy   . Anxiety   . Asthma   . Chronic pain syndrome    discharged from pain clinic, hx of narcotics seeking behavior  . COPD (chronic obstructive pulmonary disease) ([place])   . CVA (cerebral infarction)   . Depression   . Fibromyalgia   . Headache   . Hyperlipidemia   . Hypertension   . IBS (irritable bowel syndrome)   . Stroke ([place])   . Vitamin D deficiency     Past Surgical History:  Procedure Laterality Date  . ABDOMINAL HYSTERECTOMY    . APPENDECTOMY    . BREAST EXCISIONAL BIOPSY  2011   Pt states lump removed, ? side, no scar seen  . BREAST SURGERY  2011   biopsy  . CERVICAL DISCECTOMY    . CHOLECYSTECTOMY    .  SINUSOTOMY      Current Medications: Current Meds  Medication Sig  . acetaminophen (TYLENOL) 500 MG tablet Take 500 mg by mouth 2 (two) times daily as needed.   Marland Kitchen albuterol (VENTOLIN HFA) 108 (90 Base) MCG/ACT inhaler Inhale 1-2 puffs into the lungs every 6 (six) hours as needed for wheezing or shortness of breath. Use with spacer  . aspirin 81 MG chewable tablet Chew 81 mg daily by mouth.  Marland Kitchen atorvastatin (LIPITOR) 40 MG tablet TAKE 1 TABLET BY MOUTH EVERY DAY  . azelastine (ASTELIN) 0.1 % nasal spray Place 2 sprays into both nostrils 2 (two) times daily.  Marland Kitchen BREO ELLIPTA 100-25 MCG/INH AEPB INHALE 1 PUFF INTO THE LUNGS DAILY  . clonazePAM (KLONOPIN) 0.5 MG tablet TK 1 T PO DAILY PRN  . COMBIVENT RESPIMAT 20-100 MCG/ACT AERS respimat INHALE 1 PUFF BY MOUTH EVERY 6 HOURS  . DULoxetine (CYMBALTA) 20 MG capsule Take 2 capsules (40 mg total) by mouth daily.  Marland Kitchen FLUoxetine (PROZAC) 20 MG tablet Take 60 mg by mouth daily.   . furosemide (LASIX) 20 MG tablet Take 1 tablet (20 mg total) by mouth daily.  Marland Kitchen gabapentin (NEURONTIN) 300 MG capsule Take 2 capsules (600 mg total) by mouth 3 (three) times daily.  Marland Kitchen loratadine (  CLARITIN) 10 MG tablet Take 1 tablet (10 mg total) by mouth daily.  . Melatonin 3 MG CAPS Take 10 mg by mouth at bedtime as needed (sleep).   . methylPREDNISolone (MEDROL) 4 MG TBPK tablet Follow package instructions.  . metoprolol succinate (TOPROL-XL) 25 MG 24 hr tablet TAKE 1/2 TABLET(12.5 MG) BY MOUTH DAILY  . montelukast (SINGULAIR) 10 MG tablet TAKE 1 TABLET(10 MG) BY MOUTH AT BEDTIME  . SEROQUEL 100 MG tablet Take 100 mg by mouth at bedtime.   Current Facility-Administered Medications for the 05/06/19 encounter (Office Visit) with Kate Sable, MD  Medication  . albuterol (PROVENTIL) (2.5 MG/3ML) 0.083% nebulizer solution 2.5 mg     Allergies:   Bextra  [valdecoxib], Compazine [prochlorperazine edisylate], Lithium carbonate, and Lyrica [pregabalin]   Social History    Socioeconomic History  . Marital status: Legally Separated    Spouse name: Not on file  . Number of children: 2  . Years of education: Not on file  . Highest education level: Not on file  Occupational History  . Not on file  Tobacco Use  . Smoking status: Former Smoker    Packs/day: 0.75    Years: 35.00    Pack years: 26.25    Types: Cigarettes    Quit date: 12/28/2018    Years since quitting: 0.3  . Smokeless tobacco: Never Used  Substance and Sexual Activity  . Alcohol use: No    Alcohol/week: 0.0 standard drinks  . Drug use: No  . Sexual activity: Never  Other Topics Concern  . Not on file  Social History Narrative  . Not on file   Social Determinants of Health   Financial Resource Strain: Medium Risk  . Difficulty of Paying Living Expenses: Somewhat hard  Food Insecurity: No Food Insecurity  . Worried About Charity fundraiser in the Last Year: Never true  . Ran Out of Food in the Last Year: Never true  Transportation Needs: No Transportation Needs  . Lack of Transportation (Medical): No  . Lack of Transportation (Non-Medical): No  Physical Activity: Inactive  . Days of Exercise per Week: 0 days  . Minutes of Exercise per Session: 0 min  Stress: Stress Concern Present  . Feeling of Stress : Very much  Social Connections:   . Frequency of Communication with Friends and Family:   . Frequency of Social Gatherings with Friends and Family:   . Attends Religious Services:   . Active Member of Clubs or Organizations:   . Attends Archivist Meetings:   Marland Kitchen Marital Status:      Family History: The patient's family history includes Alcohol abuse in her father; Anxiety disorder in her mother; Breast cancer (age of onset: 49) in her mother; Cancer in her father; Depression in her father and mother; Gallbladder disease in her father.  ROS:   Please see the history of present illness.     All other systems reviewed and are negative.  EKGs/Labs/Other Studies  Reviewed:    The following studies were reviewed today:   EKG:  EKG is  ordered today.  The ekg ordered today demonstrates sinus rhythm, normal ECG.  Recent Labs: 03/23/2019: ALT 11; BUN 22; Creat 0.95; Hemoglobin 11.3; Platelets 223; Potassium 4.8; Sodium 143  Recent Lipid Panel    Component Value Date/Time   CHOL 135 04/10/2018 1115   CHOL 118 03/23/2015 1536   CHOL 185 12/25/2012 1449   TRIG 50 04/10/2018 1115   TRIG 93 12/25/2012 1449  HDL 59 04/10/2018 1115   HDL 44 03/23/2015 1536   HDL 39 (L) 12/25/2012 1449   CHOLHDL 2.3 04/10/2018 1115   VLDL 18 03/21/2016 0936   VLDL 19 12/25/2012 1449   LDLCALC 64 04/10/2018 1115   LDLCALC 127 (H) 12/25/2012 1449    Physical Exam:    VS:  BP 92/60 (BP Location: Left Arm, Patient Position: Sitting, Cuff Size: Normal)   Pulse 79   Ht 5\' 5"  (1.651 m)   Wt 163 lb 6 oz (74.1 kg)   SpO2 95%   BMI 27.19 kg/m     Wt Readings from Last 3 Encounters:  05/06/19 163 lb 6 oz (74.1 kg)  03/29/19 160 lb (72.6 kg)  03/25/19 168 lb (76.2 kg)     GEN:  Well nourished, well developed in no acute distress HEENT: Normal NECK: No JVD; No carotid bruits LYMPHATICS: No lymphadenopathy CARDIAC: RRR, faint 1/6 systolic murmur right lower sternal border.  No rubs, gallops RESPIRATORY: Clear but decreased breath sounds overall,  ABDOMEN: Soft, non-tender, non-distended MUSCULOSKELETAL:  2+ nonpitting edema; No deformity  SKIN: Warm and dry NEUROLOGIC:  Alert and oriented x 3 PSYCHIATRIC:  Normal affect   ASSESSMENT:    1. Dyspnea on exertion   2. Cardiac murmur   3. Claudication ([place])   4. Essential hypertension    PLAN:    In order of problems listed above:  1. Patient with dyspnea on exertion and chest pain worsening over the past couple of weeks.  It is possible her symptoms may be secondary to COPD.  This could also be an anginal equivalent in light of long smoking history, risk factors of hypertension, hyperlipidemia.  Will  evaluate patient with a Lexiscan stress test. 2. Patient with 2+ nonpitting edema in the lower extremity.  Faint systolic murmur noted on clinical exam.  Echocardiogram showed normal systolic and diastolic function.  Ejection fraction 60 to 65%.  Mild aortic valve sclerosis noted.  Aortic valve sclerosis likely cause for cardiac murmur.  3. Patient describes symptoms of claudication.  She has risk factors of former smoking, hypertension, hyperlipidemia.  Will get ABI and peripheral arterial ultrasound. 4. History of hypertension, blood pressure well controlled.  Continue beta-blocker.   Follow-up after stress test and peripheral arterial ultrasound,ABI.   Medication Adjustments/Labs and Tests Ordered: Current medicines are reviewed at length with the patient today.  Concerns regarding medicines are outlined above.  Orders Placed This Encounter  Procedures  . NM Myocar Multi W/Spect W/Wall Motion / EF  . EKG 12-Lead  . VAS [place] LOWER EXTREMITY ARTERIAL DUPLEX  . VAS [place] ABI WITH/WO TBI   No orders of the defined types were placed in this encounter.   Patient Instructions  Medication Instructions:  No changes  *If you need a refill on your cardiac medications before your next appointment, please call your pharmacy*   Lab Work: None  If you have labs (blood work) drawn today and your tests are completely normal, you will receive your results only by: Marland Kitchen MyChart Message (if you have MyChart) OR . A paper copy in the mail If you have any lab test that is abnormal or we need to change your treatment, we will call you to review the results.   Testing/Procedures: Your physician has requested that you have a lower or upper extremity arterial duplex. This test is an ultrasound of the arteries in the legs or arms. It looks at arterial blood flow in the legs and arms. Allow  one hour for Lower and Upper Arterial scans. There are no restrictions or special instructions  Your physician has  requested that you have an ankle brachial index (ABI). During this test an ultrasound and blood pressure cuff are used to evaluate the arteries that supply the arms and legs with blood. Allow thirty minutes for this exam. There are no restrictions or special instructions.  [place]  Your caregiver has ordered a Stress Test with nuclear imaging. The purpose of this test is to evaluate the blood supply to your heart muscle. This procedure is referred to as a "Non-Invasive Stress Test." This is because other than having an IV started in your vein, nothing is inserted or "invades" your body. Cardiac stress tests are done to find areas of poor blood flow to the heart by determining the extent of coronary artery disease (CAD). Some patients exercise on a treadmill, which naturally increases the blood flow to your heart, while others who are  unable to walk on a treadmill due to physical limitations have a pharmacologic/chemical stress agent called Lexiscan . This medicine will mimic walking on a treadmill by temporarily increasing your coronary blood flow.   Please note: these test may take anywhere between 2-4 hours to complete  PLEASE REPORT TO [place] AT THE FIRST DESK WILL DIRECT YOU WHERE TO GO  Date of Procedure:_____________________________________  Arrival Time for Procedure:______________________________  Instructions regarding medication:   _XX___ : Hold Furosemide (Lasix) medication the morning of procedure  _XX___:  Hold Metoprolol the night before procedure and morning of procedure   PLEASE NOTIFY THE OFFICE AT LEAST 24 HOURS IN ADVANCE IF YOU ARE UNABLE TO KEEP YOUR APPOINTMENT.  [phone] AND  PLEASE NOTIFY NUCLEAR MEDICINE AT [facility] AT LEAST 24 HOURS IN ADVANCE IF YOU ARE UNABLE TO KEEP YOUR APPOINTMENT. [phone]  How to prepare for your Myoview test:  1. Do not eat or drink after midnight 2. No caffeine for 24 hours prior to  test 3. No smoking 24 hours prior to test. 4. Your medication may be taken with water.  If your doctor stopped a medication because of this test, do not take that medication. 5. Ladies, please do not wear dresses.  Skirts or pants are appropriate. Please wear a short sleeve shirt. 6. No perfume, cologne or lotion. 7. Wear comfortable walking shoes. No heels!       Follow-Up: At [facility], you and your health needs are our priority.  As part of our continuing mission to provide you with exceptional heart care, we have created designated Provider Care Teams.  These Care Teams include your primary Cardiologist (physician) and Advanced Practice Providers (APPs -  Physician Assistants and Nurse Practitioners) who all work together to provide you with the care you need, when you need it.  We recommend signing up for the patient portal called "MyChart".  Sign up information is provided on this After Visit Summary.  MyChart is used to connect with patients for Virtual Visits (Telemedicine).  Patients are able to view lab/test results, encounter notes, upcoming appointments, etc.  Non-urgent messages can be sent to your provider as well.   To learn more about what you can do with MyChart, go to [email or web address].    Your next appointment:   Follow up after testing.   The format for your next appointment:   In Person  Provider:    You may see Kate Sable, MD or one of the following Advanced Practice  Providers on your designated Care Team:    Murray Hodgkins, NP  Christell Faith, PA-C  Marrianne Mood, PA-C         Signed, Kate Sable, MD  05/06/2019 1:06 PM    [place]

## 2019-05-06 NOTE — Patient Instructions (Signed)
Medication Instructions:  No changes  *If you need a refill on your cardiac medications before your next appointment, please call your pharmacy*   Lab Work: None  If you have labs (blood work) drawn today and your tests are completely normal, you will receive your results only by: Marland Kitchen MyChart Message (if you have MyChart) OR . A paper copy in the mail If you have any lab test that is abnormal or we need to change your treatment, we will call you to review the results.   Testing/Procedures: Your physician has requested that you have a lower or upper extremity arterial duplex. This test is an ultrasound of the arteries in the legs or arms. It looks at arterial blood flow in the legs and arms. Allow one hour for Lower and Upper Arterial scans. There are no restrictions or special instructions  Your physician has requested that you have an ankle brachial index (ABI). During this test an ultrasound and blood pressure cuff are used to evaluate the arteries that supply the arms and legs with blood. Allow thirty minutes for this exam. There are no restrictions or special instructions.  [place]  Your caregiver has ordered a Stress Test with nuclear imaging. The purpose of this test is to evaluate the blood supply to your heart muscle. This procedure is referred to as a "Non-Invasive Stress Test." This is because other than having an IV started in your vein, nothing is inserted or "invades" your body. Cardiac stress tests are done to find areas of poor blood flow to the heart by determining the extent of coronary artery disease (CAD). Some patients exercise on a treadmill, which naturally increases the blood flow to your heart, while others who are  unable to walk on a treadmill due to physical limitations have a pharmacologic/chemical stress agent called Lexiscan . This medicine will mimic walking on a treadmill by temporarily increasing your coronary blood flow.   Please note: these test may take  anywhere between 2-4 hours to complete  PLEASE REPORT TO [place] AT THE FIRST DESK WILL DIRECT YOU WHERE TO GO  Date of Procedure:_____________________________________  Arrival Time for Procedure:______________________________  Instructions regarding medication:   _XX___ : Hold Furosemide (Lasix) medication the morning of procedure  _XX___:  Hold Metoprolol the night before procedure and morning of procedure   PLEASE NOTIFY THE OFFICE AT LEAST 24 HOURS IN ADVANCE IF YOU ARE UNABLE TO KEEP YOUR APPOINTMENT.  [phone] AND  PLEASE NOTIFY NUCLEAR MEDICINE AT [facility] AT LEAST 24 HOURS IN ADVANCE IF YOU ARE UNABLE TO KEEP YOUR APPOINTMENT. [phone]  How to prepare for your Myoview test:  1. Do not eat or drink after midnight 2. No caffeine for 24 hours prior to test 3. No smoking 24 hours prior to test. 4. Your medication may be taken with water.  If your doctor stopped a medication because of this test, do not take that medication. 5. Ladies, please do not wear dresses.  Skirts or pants are appropriate. Please wear a short sleeve shirt. 6. No perfume, cologne or lotion. 7. Wear comfortable walking shoes. No heels!       Follow-Up: At [facility], you and your health needs are our priority.  As part of our continuing mission to provide you with exceptional heart care, we have created designated Provider Care Teams.  These Care Teams include your primary Cardiologist (physician) and Advanced Practice Providers (APPs -  Physician Assistants and Nurse Practitioners) who  all work together to provide you with the care you need, when you need it.  We recommend signing up for the patient portal called "MyChart".  Sign up information is provided on this After Visit Summary.  MyChart is used to connect with patients for Virtual Visits (Telemedicine).  Patients are able to view lab/test results, encounter notes, upcoming appointments, etc.  Non-urgent  messages can be sent to your provider as well.   To learn more about what you can do with MyChart, go to [email or web address].    Your next appointment:   Follow up after testing.   The format for your next appointment:   In Person  Provider:    You may see Kate Sable, MD or one of the following Advanced Practice Providers on your designated Care Team:    Murray Hodgkins, NP  Christell Faith, PA-C  Marrianne Mood, PA-C

## 2019-05-07 ENCOUNTER — Ambulatory Visit (INDEPENDENT_AMBULATORY_CARE_PROVIDER_SITE_OTHER): Payer: Medicare HMO | Admitting: Pharmacist

## 2019-05-07 ENCOUNTER — Ambulatory Visit
Admission: RE | Admit: 2019-05-07 | Discharge: 2019-05-07 | Disposition: A | Discharge status: Home / Self Care | Payer: Medicare HMO | Source: Ambulatory Visit | Attending: Student in an Organized Health Care Education/Training Program | Admitting: Student in an Organized Health Care Education/Training Program

## 2019-05-07 ENCOUNTER — Ambulatory Visit
Admission: RE | Admit: 2019-05-07 | Discharge: 2019-05-07 | Disposition: A | Discharge status: Home / Self Care | Payer: Medicare HMO | Attending: Student in an Organized Health Care Education/Training Program | Admitting: Student in an Organized Health Care Education/Training Program

## 2019-05-07 DIAGNOSIS — I10 Essential (primary) hypertension: Secondary | ICD-10-CM

## 2019-05-07 DIAGNOSIS — M25422 Effusion, left elbow: Secondary | ICD-10-CM | POA: Diagnosis not present

## 2019-05-07 DIAGNOSIS — M542 Cervicalgia: Secondary | ICD-10-CM

## 2019-05-07 DIAGNOSIS — M25512 Pain in left shoulder: Secondary | ICD-10-CM | POA: Diagnosis present

## 2019-05-07 DIAGNOSIS — R69 Illness, unspecified: Secondary | ICD-10-CM | POA: Diagnosis not present

## 2019-05-07 DIAGNOSIS — M25522 Pain in left elbow: Secondary | ICD-10-CM | POA: Diagnosis present

## 2019-05-07 DIAGNOSIS — M19012 Primary osteoarthritis, left shoulder: Secondary | ICD-10-CM | POA: Diagnosis not present

## 2019-05-07 DIAGNOSIS — F332 Major depressive disorder, recurrent severe without psychotic features: Secondary | ICD-10-CM

## 2019-05-07 DIAGNOSIS — J449 Chronic obstructive pulmonary disease, unspecified: Secondary | ICD-10-CM | POA: Diagnosis not present

## 2019-05-07 DIAGNOSIS — E785 Hyperlipidemia, unspecified: Secondary | ICD-10-CM | POA: Diagnosis not present

## 2019-05-07 NOTE — Patient Instructions (Signed)
Thank you allowing the Chronic Care Management Team to be a part of your care! It was a pleasure speaking with you today!     CCM (Chronic Care Management) Team    [name] RN, MSN, CCM Nurse Care Coordinator  [phone]   [name] PharmD  Clinical Pharmacist  [phone]   Eula Fried LCSW Clinical Social Worker [phone]  Visit Information  Goals Addressed            This Visit's Progress   . PharmD - Medication Management       CARE PLAN ENTRY (see longtitudinal plan of care for additional care plan information)   Current Barriers:  . Chronic Disease Management support, education, and care coordination needs related to previous CVA, COPD, hypertension, hyperlipidemia, depression  Pharmacist Clinical Goal(s):  Marland Kitchen Over the next 30 days, patient will work with CM Pharmacist to complete medication review address needs identified  Interventions: . Received coordination of care call back from Dr. Vance Peper office on 3/8 regarding combination SSRI & SNRI therapy. Jessica with office reports per Dr. Marquita Palms has followed up with patient regarding interaction between fluoxetine and duloxetine, advised her on discontinuing duloxetine. . Perform chart review  o Patient seen by Cardiologist on 4/8 for dyspnea and chest discomfort o Patient scheduled for cardiac stress test on 4/13 . Follow up with patient today regarding medication changes by psychiatrist, Dr. Marquita Palms o Patient confirms she received call from Dr. Vance Peper office last month and stopped duloxetine per provider's instruction o States that she will f/u with Dr. Holley Raring about this change as well . Follow up with patient regarding upcoming Lexican stress test o Patient confirms having reviewed the instructions for this procedure, including instructions on when to hold furosemide and metoprolol. . Patient confirms that she has scheduled an appointment with a new Psychiatrist who is in-network with her [place] for 4/15 . Counsel on importance of blood pressure monitoring and control ? Reports checking BP at home, yesterday: 121/70, HR 71  ? Encourage patient to keep log of BP results and bring record to upcoming medical appointments . Counsel on importance of medication adherence.  ? Again encourage patient to obtain a weekly pillbox  Patient Self Care Activities:  . To self administers medications as prescribed o Encourage patient to obtain weekly pillbox to use as adherence tool . Attends all scheduled provider appointments o Appointment with Pain Specialist on 4/12 o Appointment for Lexiscan stress test on 4/13 o Appointment with new Psychiatrist on 4/15 . Calls pharmacy for medication refills . Calls provider office for new concerns or questions  Please see past updates related to this goal by clicking on the "Past Updates" button in the selected goal         Patient verbalizes understanding of instructions provided today.   Telephone follow up appointment with care management team member scheduled for: 5/12 at 76 am  [name], PharmD, [place] [phone]

## 2019-05-07 NOTE — Chronic Care Management (AMB) (Signed)
Chronic Care Management   Follow Up Note   05/07/2019 Name: Beth [name] MRN: [id number] DOB: 23-Apr-1945  Referred by: Verl Bangs, FNP Reason for referral : Chronic Care Management (Patient Phone Call)   Beth [name] is a 74 y.o. year old female who is a primary care patient of Lorine Bears, Lupita Raider, [place]. The CCM team was consulted for assistance with chronic disease management and care coordination needs.  Patient has a past medical history including but not limited to CVA, COPD, hypertension, hyperlipidemia, depression and recent lower extremity edema.  I reached out to [name] by phone today.   Review of patient status, including review of consultants reports, relevant laboratory and other test results, and collaboration with appropriate care team members and the patient's provider was performed as part of comprehensive patient evaluation and provision of chronic care management services.     Outpatient Encounter Medications as of 05/07/2019  Medication Sig  . FLUoxetine (PROZAC) 20 MG tablet Take 60 mg by mouth daily.   Marland Kitchen acetaminophen (TYLENOL) 500 MG tablet Take 500 mg by mouth 2 (two) times daily as needed.   Marland Kitchen albuterol (VENTOLIN HFA) 108 (90 Base) MCG/ACT inhaler Inhale 1-2 puffs into the lungs every 6 (six) hours as needed for wheezing or shortness of breath. Use with spacer  . aspirin 81 MG chewable tablet Chew 81 mg daily by mouth.  Marland Kitchen atorvastatin (LIPITOR) 40 MG tablet TAKE 1 TABLET BY MOUTH EVERY DAY  . azelastine (ASTELIN) 0.1 % nasal spray Place 2 sprays into both nostrils 2 (two) times daily.  Marland Kitchen BREO ELLIPTA 100-25 MCG/INH AEPB INHALE 1 PUFF INTO THE LUNGS DAILY  . clonazePAM (KLONOPIN) 0.5 MG tablet TK 1 T PO DAILY PRN  . COMBIVENT RESPIMAT 20-100 MCG/ACT AERS respimat INHALE 1 PUFF BY MOUTH EVERY 6 HOURS  . furosemide (LASIX) 20 MG tablet Take 1 tablet (20 mg total) by mouth daily.  Marland Kitchen gabapentin (NEURONTIN) 300 MG capsule Take 2 capsules (600 mg total) by  mouth 3 (three) times daily.  Marland Kitchen loratadine (CLARITIN) 10 MG tablet Take 1 tablet (10 mg total) by mouth daily.  . Melatonin 3 MG CAPS Take 10 mg by mouth at bedtime as needed (sleep).   . methylPREDNISolone (MEDROL) 4 MG TBPK tablet Follow package instructions.  . metoprolol succinate (TOPROL-XL) 25 MG 24 hr tablet TAKE 1/2 TABLET(12.5 MG) BY MOUTH DAILY  . montelukast (SINGULAIR) 10 MG tablet TAKE 1 TABLET(10 MG) BY MOUTH AT BEDTIME  . SEROQUEL 100 MG tablet Take 100 mg by mouth at bedtime.  . [DISCONTINUED] DULoxetine (CYMBALTA) 20 MG capsule Take 2 capsules (40 mg total) by mouth daily. (Patient not taking: Reported on 05/07/2019)   Facility-Administered Encounter Medications as of 05/07/2019  Medication  . albuterol (PROVENTIL) (2.5 MG/3ML) 0.083% nebulizer solution 2.5 mg    Goals Addressed            This Visit's Progress   . PharmD - Medication Management       CARE PLAN ENTRY (see longtitudinal plan of care for additional care plan information)   Current Barriers:  . Chronic Disease Management support, education, and care coordination needs related to previous CVA, COPD, hypertension, hyperlipidemia, depression  Pharmacist Clinical Goal(s):  Marland Kitchen Over the next 30 days, patient will work with CM Pharmacist to complete medication review address needs identified  Interventions: . Received coordination of care call back from Dr. Vance Peper office on 3/8 regarding combination SSRI & SNRI therapy. Janett Billow with office  reports per Dr. Marquita Palms has followed up with patient regarding interaction between fluoxetine and duloxetine, advised her on discontinuing duloxetine. . Perform chart review  o Patient seen by Cardiologist on 4/8 for dyspnea and chest discomfort o Patient scheduled for cardiac stress test on 4/13 . Follow up with patient today regarding medication changes by psychiatrist, Dr. Marquita Palms o Patient confirms she received call from Dr. Vance Peper office last month and stopped duloxetine per  provider's instruction o States that she will f/u with Dr. Holley Raring about this change as well . Follow up with patient regarding upcoming Lexican stress test o Patient confirms having reviewed the instructions for this procedure, including instructions on when to hold furosemide and metoprolol. . Patient confirms that she has scheduled an appointment with a new Psychiatrist who is in-network with her [place] for 4/15 . Counsel on importance of blood pressure monitoring and control ? Reports checking BP at home, yesterday: 121/70, HR 71  ? Encourage patient to keep log of BP results and bring record to upcoming medical appointments . Counsel on importance of medication adherence.  ? Again encourage patient to obtain a weekly pillbox  Patient Self Care Activities:  . To self administers medications as prescribed o Encourage patient to obtain weekly pillbox to use as adherence tool . Attends all scheduled provider appointments o Appointment with Pain Specialist on 4/12 o Appointment for Lexiscan stress test on 4/13 o Appointment with new Psychiatrist on 4/15 . Calls pharmacy for medication refills . Calls provider office for new concerns or questions  Please see past updates related to this goal by clicking on the "Past Updates" button in the selected goal         Plan  Telephone follow up appointment with care management team member scheduled for: 5/12 at 11 am  [name], PharmD, [place] [phone]

## 2019-05-10 ENCOUNTER — Other Ambulatory Visit: Payer: Self-pay | Admitting: Student in an Organized Health Care Education/Training Program

## 2019-05-10 ENCOUNTER — Encounter: Payer: Self-pay | Admitting: Student in an Organized Health Care Education/Training Program

## 2019-05-10 ENCOUNTER — Other Ambulatory Visit: Payer: Self-pay

## 2019-05-10 ENCOUNTER — Ambulatory Visit (HOSPITAL_BASED_OUTPATIENT_CLINIC_OR_DEPARTMENT_OTHER): Payer: Medicare HMO | Admitting: Student in an Organized Health Care Education/Training Program

## 2019-05-10 ENCOUNTER — Ambulatory Visit
Admission: RE | Admit: 2019-05-10 | Discharge: 2019-05-10 | Disposition: A | Discharge status: Home / Self Care | Payer: Medicare HMO | Source: Ambulatory Visit | Attending: Student in an Organized Health Care Education/Training Program | Admitting: Student in an Organized Health Care Education/Training Program

## 2019-05-10 DIAGNOSIS — M47816 Spondylosis without myelopathy or radiculopathy, lumbar region: Secondary | ICD-10-CM | POA: Insufficient documentation

## 2019-05-10 MED ORDER — FENTANYL CITRATE (PF) 100 MCG/2ML IJ SOLN
25.0000 ug | INTRAMUSCULAR | Status: DC | PRN
  Administered 2019-05-10: 25 ug via INTRAVENOUS

## 2019-05-10 MED ORDER — FENTANYL CITRATE (PF) 100 MCG/2ML IJ SOLN
INTRAMUSCULAR | Status: AC
  Filled 2019-05-10: qty 2

## 2019-05-10 MED ORDER — DEXAMETHASONE SODIUM PHOSPHATE 10 MG/ML IJ SOLN
INTRAMUSCULAR | Status: AC
  Filled 2019-05-10: qty 1

## 2019-05-10 MED ORDER — ROPIVACAINE HCL 2 MG/ML IJ SOLN
INTRAMUSCULAR | Status: AC
  Filled 2019-05-10: qty 20

## 2019-05-10 MED ORDER — ROPIVACAINE HCL 2 MG/ML IJ SOLN
9.0000 mL | Freq: Once | INTRAMUSCULAR | Status: AC
  Administered 2019-05-10: 9 mL via PERINEURAL

## 2019-05-10 MED ORDER — DEXAMETHASONE SODIUM PHOSPHATE 10 MG/ML IJ SOLN
10.0000 mg | Freq: Once | INTRAMUSCULAR | Status: AC
  Administered 2019-05-10: 10 mg

## 2019-05-10 MED ORDER — LIDOCAINE HCL 2 % IJ SOLN
INTRAMUSCULAR | Status: AC
  Filled 2019-05-10: qty 20

## 2019-05-10 MED ORDER — LIDOCAINE HCL 2 % IJ SOLN
20.0000 mL | Freq: Once | INTRAMUSCULAR | Status: AC
  Administered 2019-05-10: 400 mg

## 2019-05-10 NOTE — Progress Notes (Signed)
Patient's Name: Beth [name]  MRN: [id number]  Referring Provider: [name], MD  DOB: 25-Mar-1945  PCP: Verl Bangs, FNP  DOS: 05/10/2019  Note by: [name], MD  Service setting: Ambulatory outpatient  Specialty: Interventional Pain Management  Patient type: Established  Location: [facility] (AMB) Pain Management Facility  Visit type: Interventional Procedure   Primary Reason for Visit: Interventional Pain Management Treatment. CC: Back Pain (lower, worse in right side)  Procedure:          Anesthesia, Analgesia, Anxiolysis:  Type: Lumbar Facet, Medial Branch Block(s) #2  Primary Purpose: Diagnostic Region: Posterolateral Lumbosacral Spine Level: L3, L4, L5, Medial Branch Level(s). Injecting these levels blocks the L3-4, L4-5,  lumbar facet joints. Laterality: Bilateral  Type: Moderate (Conscious) Sedation combined with Local Anesthesia Indication(s): Analgesia and Anxiety Route: Intravenous (IV) IV Access: Secured Sedation: Meaningful verbal contact was maintained at all times during the procedure  Local Anesthetic: Lidocaine 1-2%   Indications: 1. Lumbar facet arthropathy   2. Lumbar spondylosis    Pain Score: Pre-procedure: 8 /10 Post-procedure: 0-No pain/10  Pre-op Assessment:  Beth [name] is a 74 y.o. (year old), female patient, seen today for interventional treatment. She  has a past surgical history that includes Breast surgery (2011); Cervical discectomy; Appendectomy; Abdominal hysterectomy; Cholecystectomy; Sinusotomy; and Breast excisional biopsy (2011). Beth [name] has a current medication list which includes the following prescription(s): acetaminophen, albuterol, aspirin, atorvastatin, azelastine, breo ellipta, clonazepam, combivent respimat, furosemide, loratadine, melatonin, methylprednisolone, metoprolol succinate, montelukast, seroquel, fluoxetine, and gabapentin, and the following Facility-Administered Medications: albuterol and fentanyl. Her primarily  concern today is the Back Pain (lower, worse in right side)  Initial Vital Signs:  Pulse/HCG Rate: 78ECG Heart Rate: 74 Temp: (!) 97.2 F (36.2 C) Resp: 16 BP: 128/67 SpO2: 96 %  BMI: Estimated body mass index is 26.46 kg/m as calculated from the following:   Height as of this encounter: 5\' 5"  (1.651 m).   Weight as of this encounter: 159 lb (72.1 kg).  Risk Assessment: Allergies: Reviewed. She is allergic to bextra  [valdecoxib]; compazine [prochlorperazine edisylate]; lithium carbonate; and lyrica [pregabalin].  Allergy Precautions: None required Coagulopathies: Reviewed. None identified.  Blood-thinner therapy: None at this time Active Infection(s): Reviewed. None identified. Beth [name] is afebrile  Site Confirmation: Beth [name] was asked to confirm the procedure and laterality before marking the site Procedure checklist: Completed Consent: Before the procedure and under the influence of no sedative(s), amnesic(s), or anxiolytics, the patient was informed of the treatment options, risks and possible complications. To fulfill our ethical and legal obligations, as recommended by the American Medical Association's Code of Ethics, I have informed the patient of my clinical impression; the nature and purpose of the treatment or procedure; the risks, benefits, and possible complications of the intervention; the alternatives, including doing nothing; the risk(s) and benefit(s) of the alternative treatment(s) or procedure(s); and the risk(s) and benefit(s) of doing nothing. The patient was provided information about the general risks and possible complications associated with the procedure. These may include, but are not limited to: failure to achieve desired goals, infection, bleeding, organ or nerve damage, allergic reactions, paralysis, and death. In addition, the patient was informed of those risks and complications associated to Spine-related procedures, such as failure to decrease  pain; infection (i.e.: Meningitis, epidural or intraspinal abscess); bleeding (i.e.: epidural hematoma, subarachnoid hemorrhage, or any other type of intraspinal or peri-dural bleeding); organ or nerve damage (i.e.: Any type of peripheral nerve, nerve root, or spinal cord  injury) with subsequent damage to sensory, motor, and/or autonomic systems, resulting in permanent pain, numbness, and/or weakness of one or several areas of the body; allergic reactions; (i.e.: anaphylactic reaction); and/or death. Furthermore, the patient was informed of those risks and complications associated with the medications. These include, but are not limited to: allergic reactions (i.e.: anaphylactic or anaphylactoid reaction(s)); adrenal axis suppression; blood sugar elevation that in diabetics may result in ketoacidosis or comma; water retention that in patients with history of congestive heart failure may result in shortness of breath, pulmonary edema, and decompensation with resultant heart failure; weight gain; swelling or edema; medication-induced neural toxicity; particulate matter embolism and blood vessel occlusion with resultant organ, and/or nervous system infarction; and/or aseptic necrosis of one or more joints. Finally, the patient was informed that Medicine is not an exact science; therefore, there is also the possibility of unforeseen or unpredictable risks and/or possible complications that may result in a catastrophic outcome. The patient indicated having understood very clearly. We have given the patient no guarantees and we have made no promises. Enough time was given to the patient to ask questions, all of which were answered to the patient's satisfaction. Beth [name] has indicated that she wanted to continue with the procedure. Attestation: I, the ordering provider, attest that I have discussed with the patient the benefits, risks, side-effects, alternatives, likelihood of achieving goals, and potential  problems during recovery for the procedure that I have provided informed consent. Date  Time: 05/10/2019 10:47 AM  Pre-Procedure Preparation:  Monitoring: As per clinic protocol. Respiration, ETCO2, SpO2, BP, heart rate and rhythm monitor placed and checked for adequate function Safety Precautions: Patient was assessed for positional comfort and pressure points before starting the procedure. Time-out: I initiated and conducted the "Time-out" before starting the procedure, as per protocol. The patient was asked to participate by confirming the accuracy of the "Time Out" information. Verification of the correct person, site, and procedure were performed and confirmed by me, the nursing staff, and the patient. "Time-out" conducted as per Joint Commission's Universal Protocol (UP.01.01.01). Time: 1126  Description of Procedure:          Position: Prone Laterality: Bilateral. The procedure was performed in identical fashion on both sides. Levels:  L3, L4, L5,  Medial Branch Level(s) Area Prepped: Posterior Lumbosacral Region Prepping solution: ChloraPrep (2% chlorhexidine gluconate and 70% isopropyl alcohol) Safety Precautions: Aspiration looking for blood return was conducted prior to all injections. At no point did we inject any substances, as a needle was being advanced. Before injecting, the patient was told to immediately notify me if she was experiencing any new onset of "ringing in the ears, or metallic taste in the mouth". No attempts were made at seeking any paresthesias. Safe injection practices and needle disposal techniques used. Medications properly checked for expiration dates. SDV (single dose vial) medications used. After the completion of the procedure, all disposable equipment used was discarded in the proper designated medical waste containers. Local Anesthesia: Protocol guidelines were followed. The patient was positioned over the fluoroscopy table. The area was prepped in the usual  manner. The time-out was completed. The target area was identified using fluoroscopy. A 12-in long, straight, sterile hemostat was used with fluoroscopic guidance to locate the targets for each level blocked. Once located, the skin was marked with an approved surgical skin marker. Once all sites were marked, the skin (epidermis, dermis, and hypodermis), as well as deeper tissues (fat, connective tissue and muscle) were infiltrated with a small  amount of a short-acting local anesthetic, loaded on a 10cc syringe with a 25G, 1.5-in  Needle. An appropriate amount of time was allowed for local anesthetics to take effect before proceeding to the next step. Local Anesthetic: Lidocaine 2.0% The unused portion of the local anesthetic was discarded in the proper designated containers. Technical explanation of process:   L3 Medial Branch Nerve Block (MBB): The target area for the L3 medial branch is at the junction of the postero-lateral aspect of the superior articular process and the superior, posterior, and medial edge of the transverse process of L4. Under fluoroscopic guidance, a Quincke needle was inserted until contact was made with os over the superior postero-lateral aspect of the pedicular shadow (target area). After negative aspiration for blood, 1 mL of the nerve block solution was injected without difficulty or complication. The needle was removed intact. L4 Medial Branch Nerve Block (MBB): The target area for the L4 medial branch is at the junction of the postero-lateral aspect of the superior articular process and the superior, posterior, and medial edge of the transverse process of L5. Under fluoroscopic guidance, a Quincke needle was inserted until contact was made with os over the superior postero-lateral aspect of the pedicular shadow (target area). After negative aspiration for blood,1 mL of the nerve block solution was injected without difficulty or complication. The needle was removed intact. L5  Medial Branch Nerve Block (MBB): The target area for the L5 medial branch is at the junction of the postero-lateral aspect of the superior articular process and the superior, posterior, and medial edge of the sacral ala. Under fluoroscopic guidance, a Quincke needle was inserted until contact was made with os over the superior postero-lateral aspect of the pedicular shadow (target area). After negative aspiration for blood, 1 mL of the nerve block solution was injected without difficulty or complication. The needle was removed intact.  Procedural Needles: 22-gauge, 3.5-inch, Quincke needles used for all levels. Nerve block solution: 10 cc solution made of 8 cc of 0.2% ropivacaine, 2 cc of Decadron 10 mg/cc.  1 to 1.5 cc injected at each level above bilaterally.  The unused portion of the solution was discarded in the proper designated containers.  Once the entire procedure was completed, the treated area was cleaned, making sure to leave some of the prepping solution back to take advantage of its long term bactericidal properties.   Illustration of the posterior view of the lumbar spine and the posterior neural structures. Laminae of L2 through S1 are labeled. DPRL5, dorsal primary ramus of L5; DPRS1, dorsal primary ramus of S1; DPR3, dorsal primary ramus of L3; FJ, facet (zygapophyseal) joint L3-L4; I, inferior articular process of L4; LB1, lateral branch of dorsal primary ramus of L1; IAB, inferior articular branches from L3 medial branch (supplies L4-L5 facet joint); IBP, intermediate branch plexus; MB3, medial branch of dorsal primary ramus of L3; NR3, third lumbar nerve root; S, superior articular process of L5; SAB, superior articular branches from L4 (supplies L4-5 facet joint also); TP3, transverse process of L3.  Vitals:   05/10/19 1135 05/10/19 1145 05/10/19 1156 05/10/19 1206  BP: 136/64 133/67 (!) 132/59 134/73  Pulse: 93     Resp: 12 14 17 19   Temp:  98 F (36.7 C)    TempSrc:       SpO2: 94% 92% 93% 96%  Weight:      Height:        Start Time: 1126 hrs. End Time: 1137 hrs.  Imaging Guidance (  Spinal):          Type of Imaging Technique: Fluoroscopy Guidance (Spinal) Indication(s): Assistance in needle guidance and placement for procedures requiring needle placement in or near specific anatomical locations not easily accessible without such assistance. Exposure Time: Please see nurses notes. Contrast: None used. Fluoroscopic Guidance: I was personally present during the use of fluoroscopy. "Tunnel Vision Technique" used to obtain the best possible view of the target area. Parallax error corrected before commencing the procedure. "Direction-depth-direction" technique used to introduce the needle under continuous pulsed fluoroscopy. Once target was reached, antero-posterior, oblique, and lateral fluoroscopic projection used confirm needle placement in all planes. Images permanently stored in EMR. Interpretation: No contrast injected. I personally interpreted the imaging intraoperatively. Adequate needle placement confirmed in multiple planes. Permanent images saved into the patient's record.  Antibiotic Prophylaxis:   Anti-infectives (From admission, onward)   None     Indication(s): None identified  Post-operative Assessment:  Post-procedure Vital Signs:  Pulse/HCG Rate: 9372 Temp: 98 F (36.7 C) Resp: 19 BP: 134/73 SpO2: 96 %  EBL: None  Complications: No immediate post-treatment complications observed by team, or reported by patient.  Note: The patient tolerated the entire procedure well. A repeat set of vitals were taken after the procedure and the patient was kept under observation following institutional policy, for this type of procedure. Post-procedural neurological assessment was performed, showing return to baseline, prior to discharge. The patient was provided with post-procedure discharge instructions, including a section on how to identify  potential problems. Should any problems arise concerning this procedure, the patient was given instructions to immediately contact us, at any time, without hesitation. In any case, we plan to contact the patient by telephone for a follow-up status report regarding this interventional procedure.  Comments:  No additional relevant information. 5 out of 5 strength bilateral lower extremity: Plantar flexion, dorsiflexion, knee flexion, knee extension. Plan of Care   Imaging Orders     DG PAIN CLINIC C-ARM 1-60 MIN NO REPORT Procedure Orders    No procedure(s) ordered today    Medications ordered for procedure: Meds ordered this encounter  Medications  . lidocaine (XYLOCAINE) 2 % (with pres) injection 400 mg  . fentaNYL (SUBLIMAZE) injection 25-50 mcg    Make sure Narcan is available in the pyxis when using this medication. In the event of respiratory depression (RR< 8/min): Titrate NARCAN (naloxone) in increments of 0.1 to 0.2 mg IV at 2-3 minute intervals, until desired degree of reversal.  . dexamethasone (DECADRON) injection 10 mg  . dexamethasone (DECADRON) injection 10 mg  . ropivacaine (PF) 2 mg/mL (0.2%) (NAROPIN) injection 9 mL   Medications administered: We administered lidocaine, fentaNYL, dexamethasone, dexamethasone, and ropivacaine (PF) 2 mg/mL (0.2%).  See the medical record for exact dosing, route, and time of administration.  New Prescriptions   No medications on file   Disposition: Discharge home  Discharge Date & Time: 05/10/2019; 1207 hrs.   Physician-requested Follow-up: Return in about 4 weeks (around 06/07/2019) for Post Procedure Evaluation.  Future Appointments  Date Time Provider [place]  05/11/2019  9:00 AM [facility] [place]  05/17/2019 11:00 AM [place] - CMM CASE MANAGER [place] None  06/07/2019  1:00 PM [facility] [place] [facility]  06/09/2019 11:00 AM [place] - CCM PHARMACY [place] None  06/11/2019 11:00 AM Kate Sable, MD  [facility]  06/16/2019  2:45 PM [name], MD [facility] None  06/17/2019 11:00 AM [place] - CCM SOCIAL WORK [place] None  Primary Care Physician: Verl Bangs, FNP Location: [facility] Outpatient Pain Management Facility Note by: [name], MD Date: 05/10/2019; Time: 1:12 PM  Disclaimer:  Medicine is not an exact science. The only guarantee in medicine is that nothing is guaranteed. It is important to note that the decision to proceed with this intervention was based on the information collected from the patient. The Data and conclusions were drawn from the patient's questionnaire, the interview, and the physical examination. Because the information was provided in large part by the patient, it cannot be guaranteed that it has not been purposely or unconsciously manipulated. Every effort has been made to obtain as much relevant data as possible for this evaluation. It is important to note that the conclusions that lead to this procedure are derived in large part from the available data. Always take into account that the treatment will also be dependent on availability of resources and existing treatment guidelines, considered by other Pain Management Practitioners as being common knowledge and practice, at the time of the intervention. For Medico-Legal purposes, it is also important to point out that variation in procedural techniques and pharmacological choices are the acceptable norm. The indications, contraindications, technique, and results of the above procedure should only be interpreted and judged by a Board-Certified Interventional Pain Specialist with extensive familiarity and expertise in the same exact procedure and technique.

## 2019-05-10 NOTE — Progress Notes (Signed)
Safety precautions to be maintained throughout the outpatient stay will include: orient to surroundings, keep bed in low position, maintain call bell within reach at all times, provide assistance with transfer out of bed and ambulation.  

## 2019-05-10 NOTE — Patient Instructions (Signed)

## 2019-05-11 ENCOUNTER — Ambulatory Visit
Admission: RE | Admit: 2019-05-11 | Discharge: 2019-05-11 | Disposition: A | Discharge status: Home / Self Care | Payer: Medicare HMO | Source: Ambulatory Visit | Attending: Cardiology | Admitting: Cardiology

## 2019-05-11 ENCOUNTER — Telehealth: Payer: Self-pay | Admitting: *Deleted

## 2019-05-11 ENCOUNTER — Other Ambulatory Visit: Payer: Self-pay

## 2019-05-11 DIAGNOSIS — R0600 Dyspnea, unspecified: Secondary | ICD-10-CM | POA: Diagnosis not present

## 2019-05-11 DIAGNOSIS — R0609 Other forms of dyspnea: Secondary | ICD-10-CM

## 2019-05-11 LAB — NM MYOCAR MULTI W/SPECT W/WALL MOTION / EF
LV dias vol: 44 mL (ref 46–106)
LV sys vol: 15 mL
Peak HR: 96 {beats}/min
Percent HR: 66 %
Rest HR: 75 {beats}/min
TID: 1

## 2019-05-11 MED ORDER — TECHNETIUM TC 99M TETROFOSMIN IV KIT
30.3100 | PACK | Freq: Once | INTRAVENOUS | Status: AC | PRN
  Administered 2019-05-11: 30.31 via INTRAVENOUS

## 2019-05-11 MED ORDER — TECHNETIUM TC 99M TETROFOSMIN IV KIT
9.9360 | PACK | Freq: Once | INTRAVENOUS | Status: AC | PRN
  Administered 2019-05-11: 9.936 via INTRAVENOUS

## 2019-05-11 MED ORDER — REGADENOSON 0.4 MG/5ML IV SOLN
0.4000 mg | Freq: Once | INTRAVENOUS | Status: AC
  Administered 2019-05-11: 0.4 mg via INTRAVENOUS

## 2019-05-11 NOTE — Telephone Encounter (Signed)
No problems post procedure. 

## 2019-05-13 ENCOUNTER — Other Ambulatory Visit: Payer: Self-pay | Admitting: Family Medicine

## 2019-05-13 DIAGNOSIS — F332 Major depressive disorder, recurrent severe without psychotic features: Secondary | ICD-10-CM | POA: Diagnosis not present

## 2019-05-13 DIAGNOSIS — R69 Illness, unspecified: Secondary | ICD-10-CM | POA: Diagnosis not present

## 2019-05-13 DIAGNOSIS — J449 Chronic obstructive pulmonary disease, unspecified: Secondary | ICD-10-CM

## 2019-05-17 ENCOUNTER — Telehealth: Payer: Self-pay | Admitting: *Deleted

## 2019-05-17 ENCOUNTER — Telehealth: Payer: Self-pay | Admitting: General Practice

## 2019-05-17 ENCOUNTER — Ambulatory Visit: Payer: Self-pay | Admitting: General Practice

## 2019-05-17 DIAGNOSIS — M545 Low back pain, unspecified: Secondary | ICD-10-CM

## 2019-05-17 DIAGNOSIS — F332 Major depressive disorder, recurrent severe without psychotic features: Secondary | ICD-10-CM | POA: Diagnosis not present

## 2019-05-17 DIAGNOSIS — I10 Essential (primary) hypertension: Secondary | ICD-10-CM | POA: Diagnosis not present

## 2019-05-17 DIAGNOSIS — J449 Chronic obstructive pulmonary disease, unspecified: Secondary | ICD-10-CM

## 2019-05-17 DIAGNOSIS — G894 Chronic pain syndrome: Secondary | ICD-10-CM

## 2019-05-17 DIAGNOSIS — R69 Illness, unspecified: Secondary | ICD-10-CM | POA: Diagnosis not present

## 2019-05-17 DIAGNOSIS — G8929 Other chronic pain: Secondary | ICD-10-CM

## 2019-05-17 DIAGNOSIS — E785 Hyperlipidemia, unspecified: Secondary | ICD-10-CM

## 2019-05-17 DIAGNOSIS — M7989 Other specified soft tissue disorders: Secondary | ICD-10-CM

## 2019-05-17 MED ORDER — GABAPENTIN 300 MG PO CAPS: 600.0000 mg | ORAL_CAPSULE | Freq: Three times a day (TID) | ORAL | 5 refills | Status: DC

## 2019-05-17 NOTE — Telephone Encounter (Signed)
Will you send script? Last prescribed 10-2018.

## 2019-05-17 NOTE — Chronic Care Management (AMB) (Signed)
Chronic Care Management   Follow Up Note   05/17/2019 Name: Beth [name] MRN: [id number] DOB: Oct 18, 1945  Referred by: Verl Bangs, FNP Reason for referral : Chronic Care Management (Follow up: HTN/COPD/Depression)   Beth [name] is a 74 y.o. year old female who is a primary care patient of Verl Bangs, FNP. The CCM team was consulted for assistance with chronic disease management and care coordination needs.    Review of patient status, including review of consultants reports, relevant laboratory and other test results, and collaboration with appropriate care team members and the patient's provider was performed as part of comprehensive patient evaluation and provision of chronic care management services.    SDOH (Social Determinants of Health) assessments performed: No See Care Plan activities for detailed interventions related to [facility])     Outpatient Encounter Medications as of 05/17/2019  Medication Sig  . acetaminophen (TYLENOL) 500 MG tablet Take 500 mg by mouth 2 (two) times daily as needed.   Marland Kitchen albuterol (VENTOLIN HFA) 108 (90 Base) MCG/ACT inhaler Inhale 1-2 puffs into the lungs every 6 (six) hours as needed for wheezing or shortness of breath. Use with spacer  . aspirin 81 MG chewable tablet Chew 81 mg daily by mouth.  Marland Kitchen atorvastatin (LIPITOR) 40 MG tablet TAKE 1 TABLET BY MOUTH EVERY DAY  . azelastine (ASTELIN) 0.1 % nasal spray Place 2 sprays into both nostrils 2 (two) times daily.  Marland Kitchen BREO ELLIPTA 100-25 MCG/INH AEPB INHALE 1 PUFF INTO THE LUNGS DAILY  . clonazePAM (KLONOPIN) 0.5 MG tablet TK 1 T PO DAILY PRN  . COMBIVENT RESPIMAT 20-100 MCG/ACT AERS respimat INHALE 1 PUFF BY MOUTH EVERY 6 HOURS  . FLUoxetine (PROZAC) 20 MG tablet Take 60 mg by mouth daily.   . furosemide (LASIX) 20 MG tablet Take 1 tablet (20 mg total) by mouth daily.  Marland Kitchen gabapentin (NEURONTIN) 300 MG capsule Take 2 capsules (600 mg total) by mouth 3 (three) times daily.  Marland Kitchen loratadine  (CLARITIN) 10 MG tablet Take 1 tablet (10 mg total) by mouth daily.  . Melatonin 3 MG CAPS Take 10 mg by mouth at bedtime as needed (sleep).   . metoprolol succinate (TOPROL-XL) 25 MG 24 hr tablet TAKE 1/2 TABLET(12.5 MG) BY MOUTH DAILY  . montelukast (SINGULAIR) 10 MG tablet TAKE 1 TABLET(10 MG) BY MOUTH AT BEDTIME  . SEROQUEL 100 MG tablet Take 100 mg by mouth at bedtime.   Facility-Administered Encounter Medications as of 05/17/2019  Medication  . albuterol (PROVENTIL) (2.5 MG/3ML) 0.083% nebulizer solution 2.5 mg     Objective:  BP Readings from Last 3 Encounters:  05/17/19 (!) 100/56  05/10/19 134/73  05/06/19 92/60   Wt Readings from Last 3 Encounters:  05/17/19 157 lb 9.6 oz (71.5 kg)  05/10/19 159 lb (72.1 kg)  05/06/19 163 lb 6 oz (74.1 kg)    Goals Addressed            This Visit's Progress   . RNCM: I have a lot of health issues going on       [place] (see longtitudinal plan of care for additional care plan information)  Current Barriers:  . Chronic Disease Management support, education, and care coordination needs related to HTN, COPD, Depression, and Bilateral leg swelling  . Financial barriers . Mobility barriers   Clinical Goal(s) related to HTN, COPD, Depression, and bilateral leg swelling :  Over the next 120 days, patient will:  . Work with the care  management team to address educational, disease management, and care coordination needs  . Begin or continue self health monitoring activities as directed today Measure and record blood pressure 5 times per week, Measure and record weight daily, and wear compression stockings to help with bilateral leg swelling  . Call provider office for new or worsened signs and symptoms Blood pressure findings outside established parameters, Weight outside established parameters, Shortness of breath, and New or worsened symptom related to bilateral leg swelling or depression . Call care management team with questions  or concerns . Verbalize basic understanding of patient centered plan of care established today  Interventions related to HTN, COPD, Depression, and Bilateral lower leg swelling :  . Evaluation of current treatment plans and patient's adherence to plan as established by provider.  The patient is currently seeing cardiology and having testing done to determine if her heart is the reason for the swelling she is having and the shortness of breath. The patient recently had a stress test and and ECHO and her EF% is 60-65% . Assessed patient understanding of disease states.  The patient has a new concern of her left elbow hurting. The pain MD did xrays and says that he may have to draw fluid off of the elbow and if that does not work she may need to see a Psychologist, sport and exercise. The patient verbalized "it is all messed up".  The patient also complained of right hip pain. Had a fall a couple weeks ago but does not feel this is the reason for the pain. Denies injuries from the fall. Is taking medications as prescribed and also using heat and cold application.  The patient verbalized this is helping. The patient advised to let the pcp or RNCM know if there are changes that warrant the pcp evaluation.  . Assessed patient's education and care coordination needs.  The patient verbalized she is using compression hose and that is helping with the swelling. The patient says she still has some but not as much. The patient advised to elevate her legs when sitting down. The patient admits she does not always do this but will try to start remembering to do this.  . Provided the patient with contact information for the CCM team.  . Assessed for urgent cardiology appointment: The patient has completed an appointment with [facility] Cardiology the patient to have an ECHO cardiogram, was started on Lasix- states she lost 8 pounds . Assessed for device needs. The patient does not have a blood pressure cuff or a scale to monitor blood pressure or  weights. Review of resources available. Completed- the patient now has a blood pressure cuff and scale and is using daily. Today's weight is 157.6 and her blood pressure with pulse is 100/56.  Education on orthostatic hypotension and to change position slowly.  . Email to clinical administrator on help for obtaining blood pressure cuff and scale. It will be ordered tomorrow from [employer] and shipped directly to the patient. -Completed  . Provided disease specific education to patient: review of wearing of compression stockings (currently the patient is using compression stockings on bilateral legs.  Assessment reveals that the patient does not have any weeping from the legs or sores), elevation of feet and legs when sitting, the benefit of using blood pressure cuff to take daily readings- the patient is now checking blood pressure daily and recording- Today's is 100/56, and the benefit of using a scale to record daily weights- patient is doing daily weights and today  is 157.6. Review of notification to the pcp/card for weight gain or loss +/- 3 pounds in one day or +/- 5 pounds in one week. The patient verbalized understanding.  Nash Dimmer with appropriate clinical care team members regarding patient needs: Pharmacy referral in place, LCSW referral in place. The CCM team to assist with meeting the patients health and wellness needs and goals by working with the pcp and interdisciplinary team.  Patient Self Care Activities related to HTN, COPD, Depression, and bilateral leg swelling :  . Patient is unable to independently self-manage chronic health conditions  Please see past updates related to this goal by clicking on the "Past Updates" button in the selected goal          Plan:   The care management team will reach out to the patient again over the next 60 days.    [name] RN, MSN, [place] Mobile:  [phone]

## 2019-05-17 NOTE — Telephone Encounter (Signed)
Patient notified per voicemail. 

## 2019-05-17 NOTE — Patient Instructions (Signed)
Visit Information  Goals Addressed            This Visit's Progress   . RNCM: I have a lot of health issues going on       [place] (see longtitudinal plan of care for additional care plan information)  Current Barriers:  . Chronic Disease Management support, education, and care coordination needs related to HTN, COPD, Depression, and Bilateral leg swelling  . Financial barriers . Mobility barriers   Clinical Goal(s) related to HTN, COPD, Depression, and bilateral leg swelling :  Over the next 120 days, patient will:  . Work with the care management team to address educational, disease management, and care coordination needs  . Begin or continue self health monitoring activities as directed today Measure and record blood pressure 5 times per week, Measure and record weight daily, and wear compression stockings to help with bilateral leg swelling  . Call provider office for new or worsened signs and symptoms Blood pressure findings outside established parameters, Weight outside established parameters, Shortness of breath, and New or worsened symptom related to bilateral leg swelling or depression . Call care management team with questions or concerns . Verbalize basic understanding of patient centered plan of care established today  Interventions related to HTN, COPD, Depression, and Bilateral lower leg swelling :  . Evaluation of current treatment plans and patient's adherence to plan as established by provider.  The patient is currently seeing cardiology and having testing done to determine if her heart is the reason for the swelling she is having and the shortness of breath. The patient recently had a stress test and and ECHO and her EF% is 60-65% . Assessed patient understanding of disease states.  The patient has a new concern of her left elbow hurting. The pain MD did xrays and says that he may have to draw fluid off of the elbow and if that does not work she may need to see a  Psychologist, sport and exercise. The patient verbalized "it is all messed up".  The patient also complained of right hip pain. Had a fall a couple weeks ago but does not feel this is the reason for the pain. Denies injuries from the fall. Is taking medications as prescribed and also using heat and cold application.  The patient verbalized this is helping. The patient advised to let the pcp or RNCM know if there are changes that warrant the pcp evaluation.  . Assessed patient's education and care coordination needs.  The patient verbalized she is using compression hose and that is helping with the swelling. The patient says she still has some but not as much. The patient advised to elevate her legs when sitting down. The patient admits she does not always do this but will try to start remembering to do this.  . Provided the patient with contact information for the CCM team.  . Assessed for urgent cardiology appointment: The patient has completed an appointment with [facility] Cardiology the patient to have an ECHO cardiogram, was started on Lasix- states she lost 8 pounds . Assessed for device needs. The patient does not have a blood pressure cuff or a scale to monitor blood pressure or weights. Review of resources available. Completed- the patient now has a blood pressure cuff and scale and is using daily. Today's weight is 157.6 and her blood pressure with pulse is 100/56.  Education on orthostatic hypotension and to change position slowly.  . Email to clinical administrator on help for obtaining  blood pressure cuff and scale. It will be ordered tomorrow from [employer] and shipped directly to the patient. -Completed  . Provided disease specific education to patient: review of wearing of compression stockings (currently the patient is using compression stockings on bilateral legs.  Assessment reveals that the patient does not have any weeping from the legs or sores), elevation of feet and legs when sitting, the benefit of using blood  pressure cuff to take daily readings- the patient is now checking blood pressure daily and recording- Today's is 100/56, and the benefit of using a scale to record daily weights- patient is doing daily weights and today is 157.6. Review of notification to the pcp/card for weight gain or loss +/- 3 pounds in one day or +/- 5 pounds in one week. The patient verbalized understanding.  Nash Dimmer with appropriate clinical care team members regarding patient needs: Pharmacy referral in place, LCSW referral in place. The CCM team to assist with meeting the patients health and wellness needs and goals by working with the pcp and interdisciplinary team.  Patient Self Care Activities related to HTN, COPD, Depression, and bilateral leg swelling :  . Patient is unable to independently self-manage chronic health conditions  Please see past updates related to this goal by clicking on the "Past Updates" button in the selected goal         Patient verbalizes understanding of instructions provided today.   The care management team will reach out to the patient again over the next 60 days.   [name] RN, MSN, [place] Mobile: [phone]

## 2019-05-21 DIAGNOSIS — R69 Illness, unspecified: Secondary | ICD-10-CM | POA: Diagnosis not present

## 2019-05-27 ENCOUNTER — Telehealth: Payer: Self-pay

## 2019-05-27 NOTE — Telephone Encounter (Signed)
I signed off on this and put it in my outbox the day I received it.

## 2019-05-27 NOTE — Telephone Encounter (Signed)
Copied from [place] [id number]. Topic: General - Inquiry >> May 26, 2019  2:58 PM Lennox Solders wrote: Reason for CRM: pt is calling checking on the form she dropped off on Monday 4-26-202 concerning grab bar, side by side refrigerator and stand up shower    The pt was notified that her paper is available for pick up.

## 2019-05-28 IMAGING — CT CT CHEST W/O CM
2 of 4 series · 15 of 36 positions shown, 18 images · non-contrast
Comparison: Chest x-ray from 09/08/2017, chest CT from 09/25/2012

CLINICAL DATA: Followup chest x-ray with persistent cough and
congestion for several weeks

EXAM:
CT CHEST WITHOUT CONTRAST
TECHNIQUE: Multidetector CT imaging of the chest was performed following the
standard protocol without IV contrast.

[Series 2: chest · axial · 0.58mm/px · z∈[-1232,-942]mm · 12 of 173 slices shown, 15 images (1 of 2)]
[im 14/173  mediastinal]
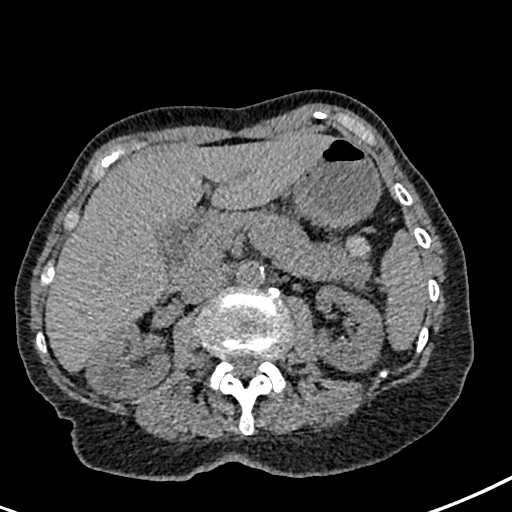
[im 14/173  lung]
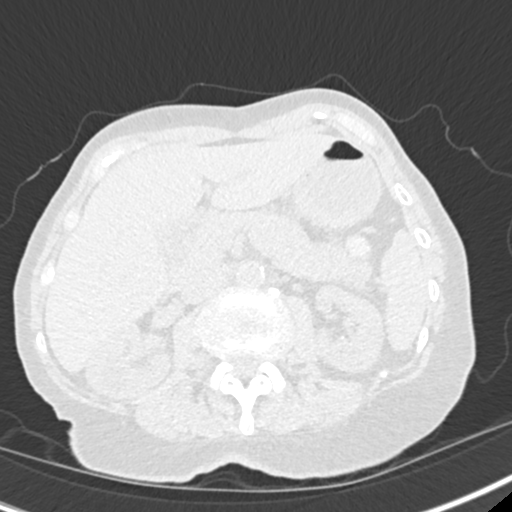
[im 27/173  lung]
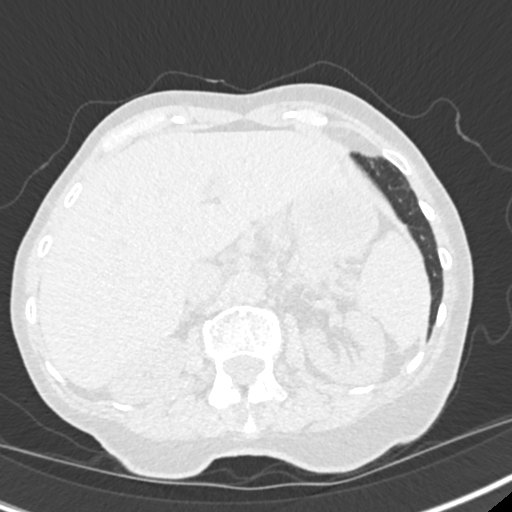
[im 40/173  lung]
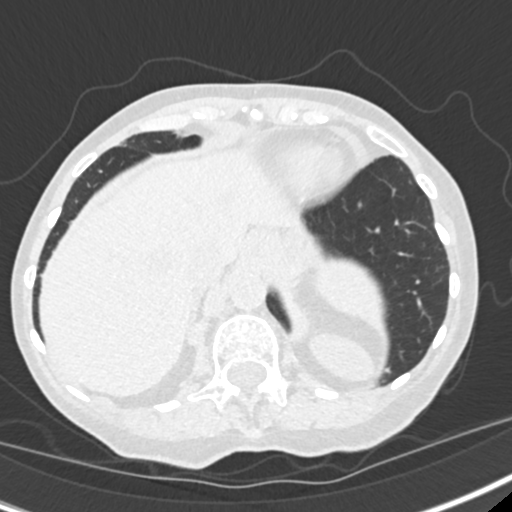
[im 53/173  lung]
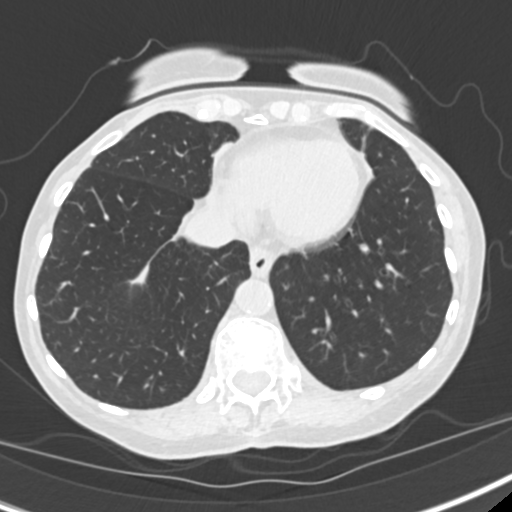
[im 67/173  mediastinal]
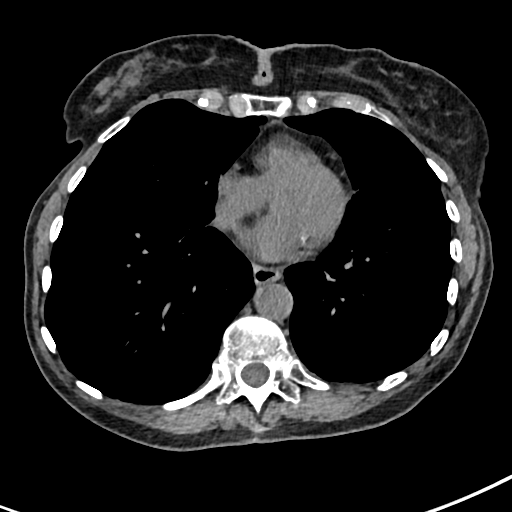
[im 67/173  lung]
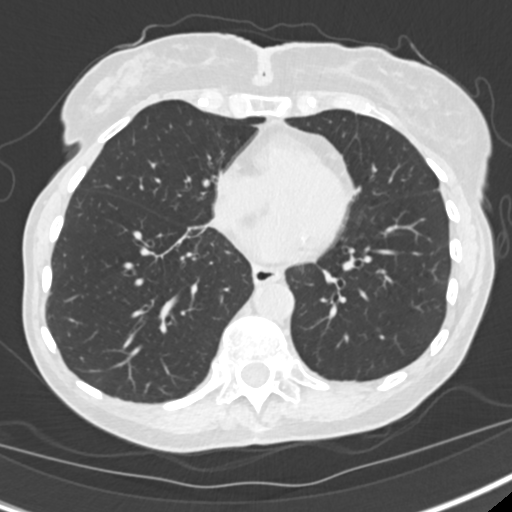
[im 80/173  lung]
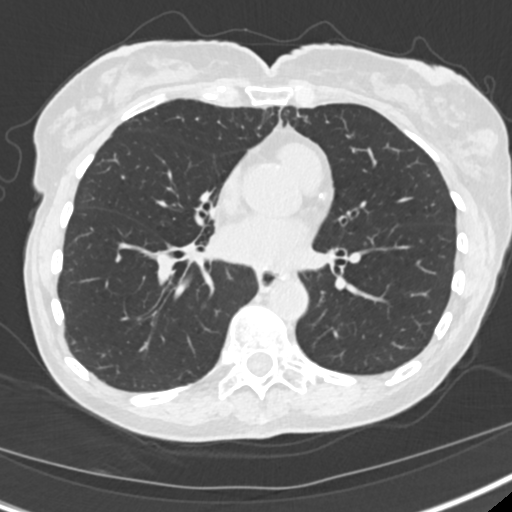
[im 93/173  lung]
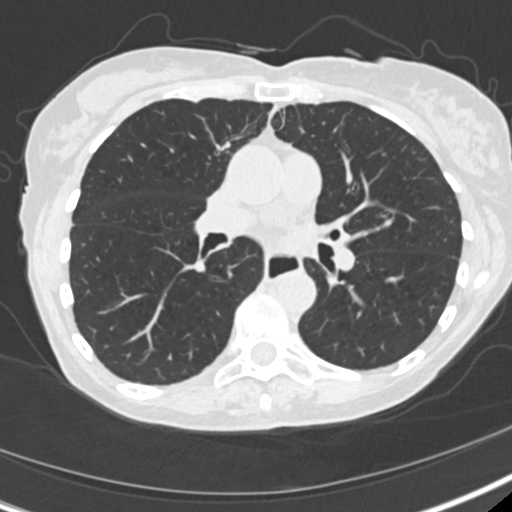
[im 106/173  lung]
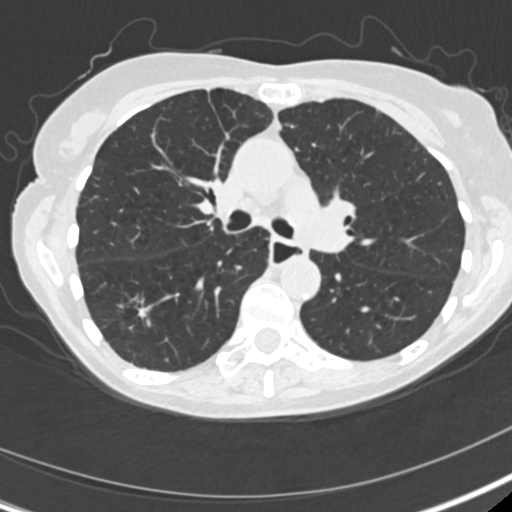
[im 120/173  mediastinal]
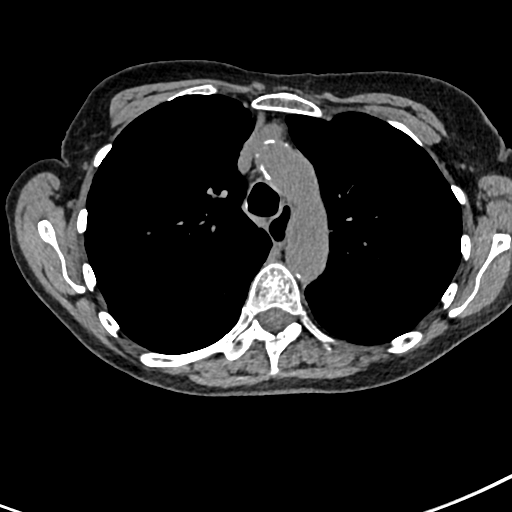
[im 120/173  lung]
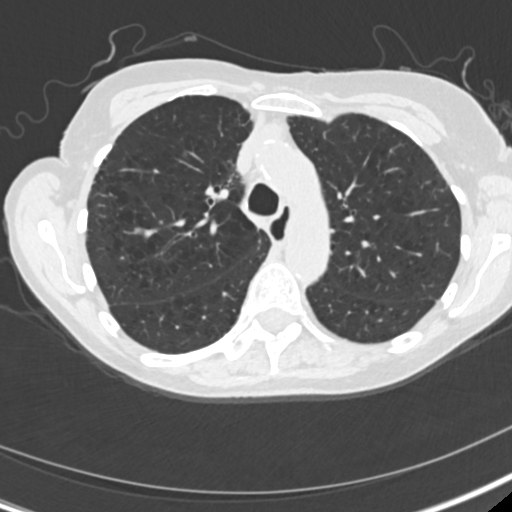
[im 133/173  lung]
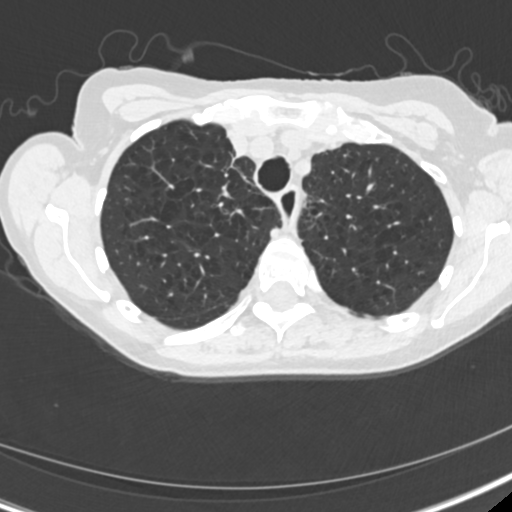
[im 146/173  lung]
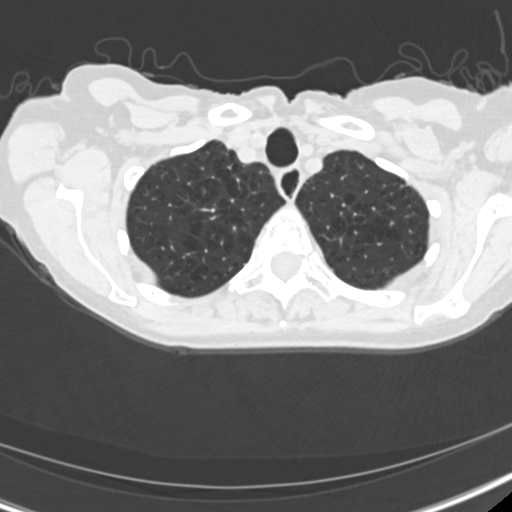
[im 159/173  lung]
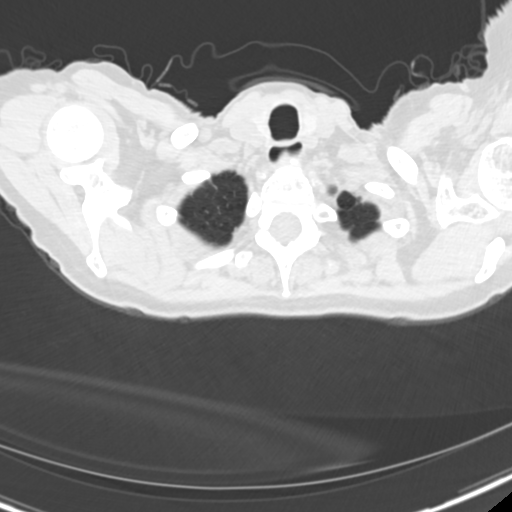

[Series 5: chest · coronal · 0.57mm/px · 3 of 122 slices shown (2 of 2)]
[im 25/122  lung]
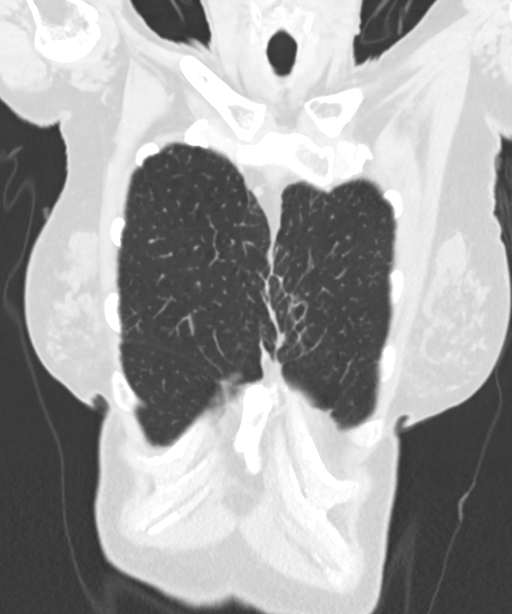
[im 49/122  lung]
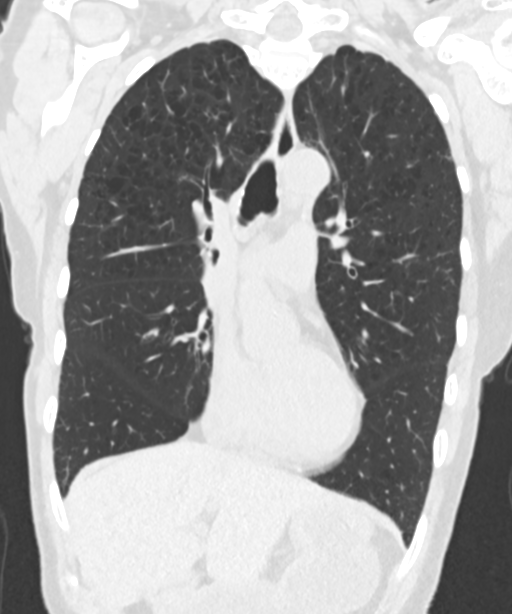
[im 73/122  lung]
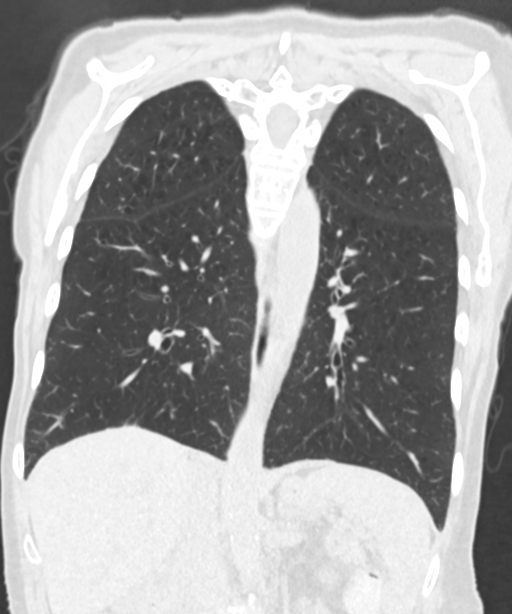

[15 of 36 positions shown; findings below may reference images not displayed]

FINDINGS: Cardiovascular: Somewhat limited due to lack of IV contrast. Aortic
calcifications are identified as are coronary calcifications. No
aneurysmal dilatation is seen. No cardiac enlargement is noted.

Mediastinum/Nodes: Thoracic inlet is within normal limits. No
sizable hilar or mediastinal adenopathy is identified. The esophagus
is within normal limits.

Lungs/Pleura: Lungs are well aerated bilaterally with diffuse
emphysematous changes seen. There is a small nodular density
identified in the posterior aspect of the right upper lobe measuring
approximately 4 mm. This is stable from the prior CT examination
from 9243 and consistent with a benign etiology. There is a somewhat
spiculated nodular density measuring approximately 5.5 cm in the
right lower lobe best seen on image number 68 of series 3. This area
was obscured on prior imaging due to significant underlying
infiltrate. This nodular density may represent some residual
scarring from that infiltrate. No other nodular changes are seen. No
sizable effusion or pneumothorax is noted.

Upper Abdomen: Visualized upper abdomen is within normal limits.

Musculoskeletal: Postsurgical changes are noted in the cervical
spine. Mild degenerative changes of the thoracic spine are noted. No
acute bony abnormality is seen. Chronic T12 compression deformity is
noted stable from previous exams.
IMPRESSION: Somewhat spiculated appearing nodule in the right lower lobe as
described. This may represent scarring from prior infiltrate in this
region although Non-contrast chest CT at 6-12 months is recommended.
If the nodule is stable at time of repeat CT, then future CT at
18-24 months (from today's scan) is considered optional for low-risk
patients, but is recommended for high-risk patients. This
recommendation follows the consensus statement: Guidelines for
Management of Incidental Pulmonary Nodules Detected on CT Images:

Right upper lobe nodule which is stable from the prior exam from
9243 and consistent with a benign etiology.

Aortic Atherosclerosis (B6OMI-1YR.R) and Emphysema (B6OMI-4IZ.B).

## 2019-06-07 ENCOUNTER — Other Ambulatory Visit: Payer: Self-pay

## 2019-06-07 ENCOUNTER — Other Ambulatory Visit: Payer: Self-pay | Admitting: Family Medicine

## 2019-06-07 ENCOUNTER — Ambulatory Visit (INDEPENDENT_AMBULATORY_CARE_PROVIDER_SITE_OTHER): Payer: Medicare HMO

## 2019-06-07 DIAGNOSIS — I739 Peripheral vascular disease, unspecified: Secondary | ICD-10-CM | POA: Diagnosis not present

## 2019-06-07 DIAGNOSIS — I10 Essential (primary) hypertension: Secondary | ICD-10-CM

## 2019-06-07 NOTE — Telephone Encounter (Signed)
Attempted to call pt.  Left vm for pt. To call office and schedule a 3 mo. F/u appt.  Courtesy refill given #15; no refills at this time.

## 2019-06-09 ENCOUNTER — Ambulatory Visit: Payer: Self-pay | Admitting: Pharmacist

## 2019-06-09 DIAGNOSIS — F332 Major depressive disorder, recurrent severe without psychotic features: Secondary | ICD-10-CM

## 2019-06-09 DIAGNOSIS — M7989 Other specified soft tissue disorders: Secondary | ICD-10-CM

## 2019-06-09 DIAGNOSIS — I10 Essential (primary) hypertension: Secondary | ICD-10-CM

## 2019-06-09 NOTE — Patient Instructions (Signed)
Thank you allowing the Chronic Care Management Team to be a part of your care! It was a pleasure speaking with you today!     CCM (Chronic Care Management) Team    [name] RN, MSN, CCM Nurse Care Coordinator  [phone]   [name] PharmD  Clinical Pharmacist  [phone]   Eula Fried LCSW Clinical Social Worker [phone]  Visit Information  Goals Addressed            This Visit's Progress   . PharmD - Medication Management       CARE PLAN ENTRY (see longtitudinal plan of care for additional care plan information)   Current Barriers:  . Chronic Disease Management support, education, and care coordination needs related to previous CVA, COPD, hypertension, hyperlipidemia, depression  Pharmacist Clinical Goal(s):  Marland Kitchen Over the next 30 days, patient will work with CM Pharmacist to complete medication review address needs identified  Interventions: . Patient confirms that she established care with a new Psychiatrist Clinic, [address] in [place], [place] (being seen via virtual visits) who is in-network with her [employer]  . Counsel on importance of blood pressure monitoring and control ? Note patient has home upper BP monitor ? Reports checking BP at home, last checked: ? 5/4: AM: 100/62, HR 69; PM: 110/59, HR 75 ? 5/3: AM: 107/70, HR 78 . Patient confirms continuing to weigh herself daily . Encourage patient to continue to monitor, keep log of BP and daily weights and bring record to upcoming medical appointments ? Remind patient to call provider for readings outside of established parameters or for new or worsening symptoms . Counsel on importance of medication adherence.  ? Again encourage patient to obtain a weekly pillbox  Patient Self Care Activities:  . To self administers medications as prescribed o Encourage patient to obtain weekly pillbox to use as adherence tool . Attends all scheduled provider appointments o Next appointment with  Cardiologist: 5/21 . Calls pharmacy for medication refills . Calls provider office for new concerns or questions  Please see past updates related to this goal by clicking on the "Past Updates" button in the selected goal         Patient verbalizes understanding of instructions provided today.   The care management team will reach out to the patient again next month.  [name], PharmD, [place] [employer] [phone]

## 2019-06-09 NOTE — Chronic Care Management (AMB) (Signed)
Chronic Care Management   Follow Up Note   06/09/2019 Name: Beth [name] MRN: [id number] DOB: Jun 22, 1945  Referred by: Beth Bangs, FNP Reason for referral : Chronic Care Management (Patient Phone Call)   Beth [name] is a 74 y.o. year old female who is a primary care patient of Beth [name], Beth [name], [place]. The CCM team was consulted for assistance with chronic disease management and care coordination needs.    I reached out to Beth [name] by phone today.   Review of patient status, including review of consultants reports, relevant laboratory and other test results, and collaboration with appropriate care team members and the patient's provider was performed as part of comprehensive patient evaluation and provision of chronic care management services.    Outpatient Encounter Medications as of 06/09/2019  Medication Sig  . furosemide (LASIX) 20 MG tablet Take 1 tablet (20 mg total) by mouth daily.  . metoprolol succinate (TOPROL-XL) 25 MG 24 hr tablet TAKE 1/2 TABLET(12.5 MG) BY MOUTH DAILY  . acetaminophen (TYLENOL) 500 MG tablet Take 500 mg by mouth 2 (two) times daily as needed.   Marland Kitchen albuterol (VENTOLIN HFA) 108 (90 Base) MCG/ACT inhaler Inhale 1-2 puffs into the lungs every 6 (six) hours as needed for wheezing or shortness of breath. Use with spacer  . aspirin 81 MG chewable tablet Chew 81 mg daily by mouth.  Marland Kitchen atorvastatin (LIPITOR) 40 MG tablet TAKE 1 TABLET BY MOUTH EVERY DAY  . azelastine (ASTELIN) 0.1 % nasal spray Place 2 sprays into both nostrils 2 (two) times daily.  Marland Kitchen BREO ELLIPTA 100-25 MCG/INH AEPB INHALE 1 PUFF INTO THE LUNGS DAILY  . clonazePAM (KLONOPIN) 0.5 MG tablet TK 1 T PO DAILY PRN  . COMBIVENT RESPIMAT 20-100 MCG/ACT AERS respimat INHALE 1 PUFF BY MOUTH EVERY 6 HOURS  . FLUoxetine (PROZAC) 20 MG tablet Take 60 mg by mouth daily.   Marland Kitchen gabapentin (NEURONTIN) 300 MG capsule Take 2 capsules (600 mg total) by mouth 3 (three) times daily.  Marland Kitchen loratadine  (CLARITIN) 10 MG tablet Take 1 tablet (10 mg total) by mouth daily.  . Melatonin 3 MG CAPS Take 10 mg by mouth at bedtime as needed (sleep).   . montelukast (SINGULAIR) 10 MG tablet TAKE 1 TABLET(10 MG) BY MOUTH AT BEDTIME  . SEROQUEL 100 MG tablet Take 100 mg by mouth at bedtime.   Facility-Administered Encounter Medications as of 06/09/2019  Medication  . albuterol (PROVENTIL) (2.5 MG/3ML) 0.083% nebulizer solution 2.5 mg    Goals Addressed            This Visit's Progress   . PharmD - Medication Management       CARE PLAN ENTRY (see longtitudinal plan of care for additional care plan information)   Current Barriers:  . Chronic Disease Management support, education, and care coordination needs related to previous CVA, COPD, hypertension, hyperlipidemia, depression  Pharmacist Clinical Goal(s):  Marland Kitchen Over the next 30 days, patient will work with CM Pharmacist to complete medication review address needs identified  Interventions: . Patient confirms that she established care with a new Psychiatrist Clinic, [address] in [place], [place] (being seen via virtual visits) who is in-network with her [employer]  . Counsel on importance of blood pressure monitoring and control ? Note patient has home upper BP monitor ? Reports checking BP at home, last checked: ? 5/4: AM: 100/62, HR 69; PM: 110/59, HR 75 ? 5/3: AM: 107/70, HR 78 . Patient confirms continuing  to weigh herself daily . Encourage patient to continue to monitor, keep log of BP and daily weights and bring record to upcoming medical appointments ? Remind patient to call provider for readings outside of established parameters or for new or worsening symptoms . Counsel on importance of medication adherence.  ? Again encourage patient to obtain a weekly pillbox  Patient Self Care Activities:  . To self administers medications as prescribed o Encourage patient to obtain weekly pillbox to use as adherence tool . Attends all  scheduled provider appointments o Next appointment with Cardiologist: 5/21 . Calls pharmacy for medication refills . Calls provider office for new concerns or questions  Please see past updates related to this goal by clicking on the "Past Updates" button in the selected goal         Plan  The care management team will reach out to the patient again next month.  [name], PharmD, [place] [employer] [phone]

## 2019-06-11 ENCOUNTER — Ambulatory Visit: Payer: Medicare HMO | Admitting: Cardiology

## 2019-06-15 ENCOUNTER — Telehealth: Payer: Self-pay | Admitting: *Deleted

## 2019-06-15 DIAGNOSIS — F332 Major depressive disorder, recurrent severe without psychotic features: Secondary | ICD-10-CM | POA: Diagnosis not present

## 2019-06-15 DIAGNOSIS — R69 Illness, unspecified: Secondary | ICD-10-CM | POA: Diagnosis not present

## 2019-06-15 NOTE — Telephone Encounter (Signed)
Attempted to call for pre appointment review of allergies/meds. Patient states this is not a good time, requested I call back later.

## 2019-06-15 NOTE — Telephone Encounter (Signed)
Attempted to call for pre appointment review of allergies/meds. Message left. 

## 2019-06-16 ENCOUNTER — Other Ambulatory Visit: Payer: Self-pay

## 2019-06-16 ENCOUNTER — Ambulatory Visit
Payer: Medicare HMO | Attending: Student in an Organized Health Care Education/Training Program | Admitting: Student in an Organized Health Care Education/Training Program

## 2019-06-16 ENCOUNTER — Encounter: Payer: Self-pay | Admitting: Student in an Organized Health Care Education/Training Program

## 2019-06-16 DIAGNOSIS — M47816 Spondylosis without myelopathy or radiculopathy, lumbar region: Secondary | ICD-10-CM

## 2019-06-16 DIAGNOSIS — G894 Chronic pain syndrome: Secondary | ICD-10-CM | POA: Diagnosis not present

## 2019-06-16 DIAGNOSIS — M25552 Pain in left hip: Secondary | ICD-10-CM

## 2019-06-16 DIAGNOSIS — M25551 Pain in right hip: Secondary | ICD-10-CM

## 2019-06-16 NOTE — Progress Notes (Signed)
Patient: Beth [name]  Service Category: E/M  Provider: [name], MD  DOB: 1945-02-16  DOS: 06/16/2019  Location: Office  MRN: [id number]  Setting: Ambulatory outpatient  Referring Provider: Verl Bangs, FNP  Type: Established Patient  Specialty: Interventional Pain Management  PCP: Verl Bangs, FNP  Location: Home   Delivery: TeleHealth     Virtual Encounter - Pain Management PROVIDER NOTE: Information contained herein reflects review and annotations entered in association with encounter. Interpretation of such information and data should be left to medically-trained personnel. Information provided to patient can be located elsewhere in the medical record under "Patient Instructions". Document created using STT-dictation technology, any transcriptional errors that may result from process are unintentional.    Contact & Pharmacy Preferred: [phone] Home: [phone] (home) Mobile: [phone] (mobile) E-mail: No e-mail address on record  [address] [facility] [address], [place] [address] Phone: [phone] Fax: [phone]   Pre-screening  Ms. Cahall offered "in-person" vs "virtual" encounter. She indicated preferring virtual for this encounter.   Reason COVID-19*  Social distancing based on CDC and AMA recommendations.   I contacted Beth [name] on 06/16/2019 via video conference.      I clearly identified myself as [name], MD. I verified that I was speaking with the correct person using two identifiers (Name: HELENA SARDO, and date of birth: 05/09/1945).  Consent I sought verbal advanced consent from Beth [name] for virtual visit interactions. I informed Ms. Winzer of possible security and privacy concerns, risks, and limitations associated with providing "not-in-person" medical evaluation and management services. I also informed Ms. Yeomans of the availability  of "in-person" appointments. Finally, I informed her that there would be a charge for the virtual visit and that she could be  personally, fully or partially, financially responsible for it. Ms. Hartgrove expressed understanding and agreed to proceed.   Historic Elements   Ms. JAMITA MCKELVIN is a 74 y.o. year old, female patient evaluated today after her last contact with our practice on 06/15/2019. Ms. Penalver  has a past medical history of Allergy, Anxiety, Asthma, Chronic pain syndrome, COPD (chronic obstructive pulmonary disease) ([place]), CVA (cerebral infarction), Depression, Fibromyalgia, Headache, Hyperlipidemia, Hypertension, IBS (irritable bowel syndrome), Stroke ([place]), and Vitamin D deficiency. She also  has a past surgical history that includes Breast surgery (2011); Cervical discectomy; Appendectomy; Abdominal hysterectomy; Cholecystectomy; Sinusotomy; and Breast excisional biopsy (2011). Ms. Stetzel has a current medication list which includes the following prescription(s): acetaminophen, albuterol, aspirin, atorvastatin, azelastine, breo ellipta, clonazepam, combivent respimat, fluoxetine, furosemide, gabapentin, loratadine, melatonin, metoprolol succinate, montelukast, and seroquel, and the following Facility-Administered Medications: albuterol. She  reports that she quit smoking about 5 months ago. Her smoking use included cigarettes. She has a 26.25 pack-year smoking history. She has never used smokeless tobacco. She reports that she does not drink alcohol or use drugs. Ms. Azzaro is allergic to bextra  [valdecoxib]; compazine [prochlorperazine edisylate]; lithium carbonate; and lyrica [pregabalin].   HPI  Today, she is being contacted for a post-procedure assessment.  Evaluation of last interventional procedure  05/10/2019 Procedure:   Type: Lumbar Facet, Medial Branch Block(s) #2  Primary Purpose: Diagnostic Region: Posterolateral Lumbosacral Spine Level: L3, L4, L5, Medial  Branch Level(s). Injecting these levels blocks the L3-4, L4-5,  lumbar facet joints. Laterality: Bilateral  Sedation: Please see nurses note for DOS. When no sedatives are used, the  analgesic levels obtained are directly associated to the effectiveness of the local anesthetics. However, when sedation is provided, the level of analgesia obtained during the initial 1 hour following the intervention, is believed to be the result of a combination of factors. These factors may include, but are not limited to: 1. The effectiveness of the local anesthetics used. 2. The effects of the analgesic(s) and/or anxiolytic(s) used. 3. The degree of discomfort experienced by the patient at the time of the procedure. 4. The patients ability and reliability in recalling and recording the events. 5. The presence and influence of possible secondary gains and/or psychosocial factors. Reported result: Relief experienced during the 1st hour after the procedure: 100%   (Ultra-Short Term Relief)            Interpretative annotation: Clinically appropriate result. Analgesia during this period is likely to be Local Anesthetic and/or IV Sedative (Analgesic/Anxiolytic) related.          Effects of local anesthetic: The analgesic effects attained during this period are directly associated to the localized infiltration of local anesthetics and therefore cary significant diagnostic value as to the etiological location, or anatomical origin, of the pain. Expected duration of relief is directly dependent on the pharmacodynamics of the local anesthetic used. Long-acting (4-6 hours) anesthetics used.  Reported result: Relief during the next 4 to 6 hour after the procedure: 100%   (Short-Term Relief)            Interpretative annotation: Clinically appropriate result. Analgesia during this period is likely to be Local Anesthetic-related.          Long-term benefit: Defined as the period of time past the expected duration of local  anesthetics (1 hour for short-acting and 4-6 hours for long-acting). With the possible exception of prolonged sympathetic blockade from the local anesthetics, benefits during this period are typically attributed to, or associated with, other factors such as analgesic sensory neuropraxia, antiinflammatory effects, or beneficial biochemical changes provided by agents other than the local anesthetics.  Reported result: Extended relief following procedure: 60% for first 3 weeks but now back to baseline   (Long-Term Relief)            Interpretative annotation: Clinically appropriate result. Good relief. No permanent benefit expected. Inflammation plays a part in the etiology to the pain.          UDS:  Summary  Date Value Ref Range Status  02/24/2017 FINAL  Final    Comment:    ==================================================================== TOXASSURE COMP DRUG ANALYSIS,UR ==================================================================== Test                             Result       Flag       Units Drug Present and Declared for Prescription Verification   Gabapentin                     PRESENT      EXPECTED   Aripiprazole                   PRESENT      EXPECTED   Metoprolol                     PRESENT      EXPECTED Drug Present not Declared for Prescription Verification   7-aminoclonazepam              368  UNEXPECTED ng/mg creat    7-aminoclonazepam is an expected metabolite of clonazepam. Source    of clonazepam is a scheduled prescription medication.   Citalopram                     PRESENT      UNEXPECTED   Desmethylcitalopram            PRESENT      UNEXPECTED    Desmethylcitalopram is an expected metabolite of citalopram or    the enantiomeric form, escitalopram.   Hydroxyzine                    PRESENT      UNEXPECTED Drug Absent but Declared for Prescription Verification   Duloxetine                     Not Detected UNEXPECTED   Acetaminophen                  Not  Detected UNEXPECTED    Acetaminophen, as indicated in the declared medication list, is    not always detected even when used as directed.   Salicylate                     Not Detected UNEXPECTED    Aspirin, as indicated in the declared medication list, is not    always detected even when used as directed. ==================================================================== Test                      Result    Flag   Units      Ref Range   Creatinine              62               mg/dL      >=20 ==================================================================== Declared Medications:  The flagging and interpretation on this report are based on the  following declared medications.  Unexpected results may arise from  inaccuracies in the declared medications.  **Note: The testing scope of this panel includes these medications:  Aripiprazole  Duloxetine  Gabapentin  Metoprolol  **Note: The testing scope of this panel does not include small to  moderate amounts of these reported medications:  Acetaminophen  Aspirin (Aspirin 81)  **Note: The testing scope of this panel does not include following  reported medications:  Albuterol (Ipratropium-Albuterol)  Atorvastatin  Azelastine  Fluticasone  Fluticasone (Breo)  Ipratropium (Ipratropium-Albuterol)  Multivitamin (MVI)  Vilanterol (Breo) ==================================================================== For clinical consultation, please call [phone]. ====================================================================    Laboratory Chemistry Profile   Renal Lab Results  Component Value Date   BUN 22 03/23/2019   CREATININE 0.95 (H) 03/23/2019   BCR 23 (H) 03/23/2019   GFRAA 64 04/10/2018   GFRNONAA 55 (L) 04/10/2018     Hepatic Lab Results  Component Value Date   AST 15 03/23/2019   ALT 11 03/23/2019   ALBUMIN 4.0 07/15/2017   ALKPHOS 81 07/15/2017   LIPASE 27 02/13/2016     Electrolytes Lab Results   Component Value Date   NA 143 03/23/2019   K 4.8 03/23/2019   CL 107 03/23/2019   CALCIUM 9.6 03/23/2019   MG 1.8 12/25/2012     Bone Lab Results  Component Value Date   VD25OH 37 03/21/2016     Inflammation (CRP: Acute Phase) (ESR: Chronic Phase) Lab Results  Component Value Date   CRP  0.3 03/21/2016   ESRSEDRATE 1 03/21/2016   LATICACIDVEN 1.3 12/23/2015       Note: Above Lab results reviewed.  Imaging  VAS [place] LOWER EXT ART SEG MULTI (SEGMENTALS & LE RAYNAUDS) LOWER EXTREMITY DOPPLER STUDY  Indications: Peripheral artery disease.  High Risk         Hypertension, hyperlipidemia, past history of smoking, prior Factors:          CVA.  Other Factors: Patient states that her bilateral calves bother her due to the                edema. She states that they ache all the time because of the                swelling. She denies any claudication and no rest pain.  Performing Technologist: Wilkie Aye RVT    Examination Guidelines: A complete evaluation includes at minimum, Doppler waveform signals and systolic blood pressure reading at the level of bilateral brachial, anterior tibial, and posterior tibial arteries, when vessel segments are accessible. Bilateral testing is considered an integral part of a complete examination. Photoelectric Plethysmograph (PPG) waveforms and toe systolic pressure readings are included as required and additional duplex testing as needed. Limited examinations for reoccurring indications may be performed as noted.    ABI Findings: +---------+------------------+-----+---------+--------+ Right    Rt Pressure (mmHg)IndexWaveform Comment  +---------+------------------+-----+---------+--------+ Brachial 113                                      +---------+------------------+-----+---------+--------+ CFA                             triphasic         +---------+------------------+-----+---------+--------+ Popliteal                        triphasic         +---------+------------------+-----+---------+--------+ ATA      145               1.24 triphasic         +---------+------------------+-----+---------+--------+ PTA      144               1.23 triphasic         +---------+------------------+-----+---------+--------+ PERO     129               1.10 triphasic         +---------+------------------+-----+---------+--------+ Great Toe92                0.79 Normal            +---------+------------------+-----+---------+--------+  +---------+------------------+-----+---------+-------+ Left     Lt Pressure (mmHg)IndexWaveform Comment +---------+------------------+-----+---------+-------+ Brachial 117                                     +---------+------------------+-----+---------+-------+ CFA                             triphasic        +---------+------------------+-----+---------+-------+ Popliteal                       triphasic        +---------+------------------+-----+---------+-------+ ATA  126               1.08 triphasic        +---------+------------------+-----+---------+-------+ PTA      133               1.14 triphasic        +---------+------------------+-----+---------+-------+ PERO     134               1.15 triphasic        +---------+------------------+-----+---------+-------+ Great Toe79                0.68 Normal           +---------+------------------+-----+---------+-------+  +-------+-----------+-----------+ ABI/TBIToday's ABIToday's TBI +-------+-----------+-----------+ Right  1.23       0.79        +-------+-----------+-----------+ Left   1.15       0.68        +-------+-----------+-----------+    Summary: Right: Resting right ankle-brachial index is within normal range. No evidence of significant right lower extremity arterial disease. The right toe-brachial index is normal.  Left:  Resting left ankle-brachial index is within normal range. No evidence of significant left lower extremity arterial disease. The left toe-brachial index is abnormal.    *See table(s) above for measurements and observations.    Electronically signed by Quay Burow MD on 06/07/2019 at 5:17:11 PM.       Final    Assessment  The primary encounter diagnosis was Lumbar facet arthropathy. Diagnoses of Lumbar spondylosis, Right hip pain, Left hip pain, and Chronic pain syndrome were also pertinent to this visit.  Plan of Care  [name] has a current medication list which includes the following long-term medication(s): albuterol, atorvastatin, azelastine, combivent respimat, fluoxetine, furosemide, gabapentin, loratadine, metoprolol succinate, and montelukast.  Patient is status post bilateral lumbar facet medial branch nerve block on 2 occasions at L3, L4, L5.  Both of these diagnostic blocks resulted in greater than 50% pain relief for approximately 2 to 3 weeks with improvement in her functional status.  Patient now endorses return of pain and is interested in pursuing therapeutic lumbar radiofrequency ablation.  Risks and benefits of this procedure were discussed in great detail and patient would like to proceed starting with the right side first.  Patient continues to endorse persistent hip pain, it has been over 3 years since we have imaged her hips, will repeat bilateral hip x-ray.  Orders:  Orders Placed This Encounter  Procedures  . Radiofrequency,Lumbar    Standing Status:   Future    Standing Expiration Date:   12/16/2020    Scheduling Instructions:     Side(s): RIGHT     Level: L3-4, L4-5  Facets ( L3, L4, L5, Medial Branch Nerves)     Sedation: With Sedation     Scheduling Timeframe: As soon as pre-approved    Order Specific Question:   Where will this procedure be performed?    Answer:   [facility]  . Radiofrequency,Lumbar    Standing Status:   Future     Standing Expiration Date:   12/16/2020    Scheduling Instructions:     Side(s): LEFT     Level: L3-4, L4-5  Facets ( L3, L4, L5, Medial Branch Nerves)     Sedation: With Sedation     Scheduling Timeframe: 2 weeks after right    Order Specific Question:   Where will this procedure be performed?    Answer:  [facility]  . DG HIP UNILAT W OR W/O PELVIS 2-3 VIEWS RIGHT    Standing Status:   Future    Standing Expiration Date:   06/15/2020    Scheduling Instructions:     Please describe any evidence of DJD, such as joint narrowing, asymmetry, cysts, or any anomalies in bone density, production, or erosion.    Order Specific Question:   Reason for Exam (SYMPTOM  OR DIAGNOSIS REQUIRED)    Answer:   Right hip pain/arthralgia    Order Specific Question:   Preferred imaging location?    Answer:   [facility]    Order Specific Question:   Call Results- Best Contact Number?    Answer:   [phone] (Pain Clinic facility) (Dr. Dossie Arbour)  . DG HIP UNILAT W OR W/O PELVIS 2-3 VIEWS LEFT    Standing Status:   Future    Standing Expiration Date:   06/15/2020    Scheduling Instructions:     Please describe any evidence of DJD, such as joint narrowing, asymmetry, cysts, or any anomalies in bone density, production, or erosion.    Order Specific Question:   Reason for Exam (SYMPTOM  OR DIAGNOSIS REQUIRED)    Answer:   Right hip pain/arthralgia    Order Specific Question:   Preferred [facility] location?    [facility]    Order Specific Question:   Call Results- Best Contact Number?    Answer:   [phone] (Pain Clinic facility) (Dr. Dossie Arbour)   Follow-up plan:   Return in about 2 weeks (around 06/30/2019) for R L3, 4, 5 RFA, with sedation.     Status post bilateral facet medial branch nerve blocks L3, L4, L5 on 09/03/2017, helpful for axial low back and buttock pain: Repeat PRN.  Status post right L4-L5 epidural steroid injection on 03/09/2018, 07/29/2018 which was helpful for  her right-sided hip and leg pain.  Right L4-L5 ESI #3 on 09/23/2027 not as helpful.      Recent Visits Date Type Provider Dept  05/10/19 Procedure visit [name], MD [facility]  05/04/19 Office Visit [name], MD [facility]  Showing recent visits within past 90 days and meeting all other requirements   Today's Visits Date Type Provider Dept  06/16/19 Telemedicine [name], MD [facility]  Showing today's visits and meeting all other requirements   Future Appointments No visits were found meeting these conditions.  Showing future appointments within next 90 days and meeting all other requirements   I discussed the assessment and treatment plan with the patient. The patient was provided an opportunity to ask questions and all were answered. The patient agreed with the plan and demonstrated an understanding of the instructions.  Patient advised to call back or seek an in-person evaluation if the symptoms or condition worsens.  Duration of encounter: 25 minutes.  Note by: [name], MD Date: 06/16/2019; Time: 3:14 PM

## 2019-06-17 ENCOUNTER — Telehealth: Payer: Self-pay

## 2019-06-18 ENCOUNTER — Ambulatory Visit: Payer: Medicare HMO | Admitting: Cardiology

## 2019-06-22 ENCOUNTER — Telehealth: Payer: Self-pay

## 2019-06-22 NOTE — Telephone Encounter (Signed)
Copied from [place] [id number]. Topic: General - Other >> Jun 21, 2019 12:35 PM Alanda Slim E wrote: Reason for CRM: Pt received a call and was told to call office but she is not sure why/ she stated it maybe to schedule an appt/ please advise  Attempted to contact the patient to notify her that she need to schedule her f/u appt with Elmyra Ricks.

## 2019-07-01 ENCOUNTER — Other Ambulatory Visit: Payer: Self-pay | Admitting: Family Medicine

## 2019-07-01 DIAGNOSIS — J302 Other seasonal allergic rhinitis: Secondary | ICD-10-CM

## 2019-07-01 DIAGNOSIS — J3089 Other allergic rhinitis: Secondary | ICD-10-CM

## 2019-07-06 ENCOUNTER — Other Ambulatory Visit: Payer: Self-pay | Admitting: Family Medicine

## 2019-07-06 DIAGNOSIS — E785 Hyperlipidemia, unspecified: Secondary | ICD-10-CM

## 2019-07-06 DIAGNOSIS — I10 Essential (primary) hypertension: Secondary | ICD-10-CM

## 2019-07-06 DIAGNOSIS — I69359 Hemiplegia and hemiparesis following cerebral infarction affecting unspecified side: Secondary | ICD-10-CM

## 2019-07-06 NOTE — Telephone Encounter (Signed)
Requested Prescriptions  Pending Prescriptions Disp Refills   atorvastatin (LIPITOR) 40 MG tablet [Pharmacy Med Name: ATORVASTATIN 40MG  TABLETS] 90 tablet 0    Sig: TAKE 1 TABLET BY MOUTH EVERY DAY     Cardiovascular:  Antilipid - Statins Failed - 07/06/2019 10:22 AM      Failed - Total Cholesterol in normal range and within 360 days    Cholesterol, Total  Date Value Ref Range Status  03/23/2015 118 100 - 199 mg/dL Final   Cholesterol  Date Value Ref Range Status  04/10/2018 135 <200 mg/dL Final  12/25/2012 185 0 - 200 mg/dL Final         Failed - LDL in normal range and within 360 days    Ldl Cholesterol, Calc  Date Value Ref Range Status  12/25/2012 127 (H) 0 - 100 mg/dL Final   LDL Cholesterol (Calc)  Date Value Ref Range Status  04/10/2018 64 mg/dL (calc) Final    Comment:    Reference range: <100 . Desirable range <100 mg/dL for primary prevention;   <70 mg/dL for patients with CHD or diabetic patients  with > or = 2 CHD risk factors. Marland Kitchen LDL-C is now calculated using the Martin-Hopkins  calculation, which is a validated novel method providing  better accuracy than the Friedewald equation in the  estimation of LDL-C.  Cresenciano Genre et al. Annamaria Helling. 7121;975(88): 2061-2068  (http://education.[facility].com/faq/FAQ164)          Failed - HDL in normal range and within 360 days    HDL Cholesterol  Date Value Ref Range Status  12/25/2012 39 (L) 40 - 60 mg/dL Final   HDL  Date Value Ref Range Status  04/10/2018 59 > OR = 50 mg/dL Final  03/23/2015 44 >39 mg/dL Final         Failed - Triglycerides in normal range and within 360 days    Triglycerides  Date Value Ref Range Status  04/10/2018 50 <150 mg/dL Final  12/25/2012 93 0 - 200 mg/dL Final         Passed - Patient is not pregnant      Passed - Valid encounter within last 12 months    Recent Outpatient Visits          3 months ago Leg swelling   [facility], Lupita Raider, FNP   3 months  ago Leg swelling   [facility], Lupita Raider, FNP   5 months ago COPD with acute exacerbation [facility])   [place], DO   9 months ago Perennial allergic rhinitis with seasonal variation   [place], DO   11 months ago Olecranon bursitis of left elbow   [place], [name], DO      Future Appointments            In 3 days [place], Lupita Raider, [facility], [facility]

## 2019-07-07 ENCOUNTER — Other Ambulatory Visit: Payer: Self-pay

## 2019-07-07 ENCOUNTER — Ambulatory Visit
Admission: RE | Admit: 2019-07-07 | Discharge: 2019-07-07 | Disposition: A | Discharge status: Home / Self Care | Payer: Medicare HMO | Source: Ambulatory Visit | Attending: Student in an Organized Health Care Education/Training Program | Admitting: Student in an Organized Health Care Education/Training Program

## 2019-07-07 ENCOUNTER — Encounter: Payer: Self-pay | Admitting: Student in an Organized Health Care Education/Training Program

## 2019-07-07 ENCOUNTER — Ambulatory Visit (HOSPITAL_BASED_OUTPATIENT_CLINIC_OR_DEPARTMENT_OTHER): Payer: Medicare HMO | Admitting: Student in an Organized Health Care Education/Training Program

## 2019-07-07 DIAGNOSIS — M47816 Spondylosis without myelopathy or radiculopathy, lumbar region: Secondary | ICD-10-CM | POA: Diagnosis not present

## 2019-07-07 MED ORDER — FENTANYL CITRATE (PF) 100 MCG/2ML IJ SOLN
INTRAMUSCULAR | Status: AC
  Filled 2019-07-07: qty 2

## 2019-07-07 MED ORDER — DEXAMETHASONE SODIUM PHOSPHATE 10 MG/ML IJ SOLN
10.0000 mg | Freq: Once | INTRAMUSCULAR | Status: AC
  Administered 2019-07-07: 10 mg

## 2019-07-07 MED ORDER — LIDOCAINE HCL 2 % IJ SOLN
INTRAMUSCULAR | Status: AC
  Filled 2019-07-07: qty 20

## 2019-07-07 MED ORDER — ROPIVACAINE HCL 2 MG/ML IJ SOLN
INTRAMUSCULAR | Status: AC
  Filled 2019-07-07: qty 10

## 2019-07-07 MED ORDER — LIDOCAINE HCL 2 % IJ SOLN
20.0000 mL | Freq: Once | INTRAMUSCULAR | Status: AC
  Administered 2019-07-07: 400 mg

## 2019-07-07 MED ORDER — FENTANYL CITRATE (PF) 100 MCG/2ML IJ SOLN
25.0000 ug | INTRAMUSCULAR | Status: DC | PRN
  Administered 2019-07-07: 25 ug via INTRAVENOUS

## 2019-07-07 MED ORDER — DEXAMETHASONE SODIUM PHOSPHATE 10 MG/ML IJ SOLN
INTRAMUSCULAR | Status: AC
  Filled 2019-07-07: qty 1

## 2019-07-07 MED ORDER — ROPIVACAINE HCL 2 MG/ML IJ SOLN
2.0000 mL | Freq: Once | INTRAMUSCULAR | Status: AC
  Administered 2019-07-07: 10 mL via EPIDURAL

## 2019-07-07 NOTE — Patient Instructions (Addendum)
Post-procedure Information What to expect: Most procedures involve the use of a local anesthetic (numbing medicine), and a steroid (anti-inflammatory medicine).  The local anesthetics may cause temporary numbness and weakness of the legs or arms, depending on the location of the block. This numbness/weakness may last 4-6 hours, depending on the local anesthetic used. In rare instances, it can last up to 24 hours. While numb, you must be very careful not to injure the extremity.  After any procedure, you could expect the pain to get better within 15-20 minutes. This relief is temporary and may last 4-6 hours. Once the local anesthetics wears off, you could experience discomfort, possibly more than usual, for up to 10 (ten) days. In the case of radiofrequencies, it may last up to 6 weeks. Surgeries may take up to 8 weeks for the healing process. The discomfort is due to the irritation caused by needles going through skin and muscle. To minimize the discomfort, we recommend using ice the first day, and heat from then on. The ice should be applied for 15 minutes on, and 15 minutes off. Keep repeating this cycle until bedtime. Avoid applying the ice directly to the skin, to prevent frostbite. Heat should be used daily, until the pain improves (4-10 days). Be careful not to burn yourself.  Occasionally you may experience muscle spasms or cramps. These occur as a consequence of the irritation caused by the needle sticks to the muscle and the blood that will inevitably be lost into the surrounding muscle tissue. Blood tends to be very irritating to tissues, which tend to react by going into spasm. These spasms may start the same day of your procedure, but they may also take days to develop. This late onset type of spasm or cramp is usually caused by electrolyte imbalances triggered by the steroids, at the level of the kidney. Cramps and spasms tend to respond well to muscle relaxants, multivitamins (some are  triggered by the procedure, but may have their origins in vitamin deficiencies), and "Gatorade", or any sports drinks that can replenish any electrolyte imbalances. (If you are a diabetic, ask your pharmacist to get you a sugar-free brand.) Warm showers or baths may also be helpful. Stretching exercises are highly recommended. General Instructions:  Be alert for signs of possible infection: redness, swelling, heat, red streaks, elevated temperature, and/or fever. These typically appear 4 to 6 days after the procedure. Immediately notify your doctor if you experience unusual bleeding, difficulty breathing, or loss of bowel or bladder control. If you experience increased pain, do not increase your pain medicine intake, unless instructed by your pain physician. Post-Procedure Care:  Be careful in moving about. Muscle spasms in the area of the injection may occur. Applying ice or heat to the area is often helpful. The incidence of spinal headaches after epidural injections ranges between 1.4% and 6%. If you develop a headache that does not seem to respond to conservative therapy, please let your physician know. This can be treated with an epidural blood patch.   Post-procedure numbness or redness is to be expected, however it should average 4 to 6 hours. If numbness and weakness of your extremities begins to develop 4 to 6 hours after your procedure, and is felt to be progressing and worsening, immediately contact your physician.   Diet:  If you experience nausea, do not eat until this sensation goes away. If you had a "Stellate Ganglion Block" for upper extremity "Reflex Sympathetic Dystrophy", do not eat or drink until your   hoarseness goes away. In any case, always start with liquids first and if you tolerate them well, then slowly progress to more solid foods. Activity:  For the first 4 to 6 hours after the procedure, use caution in moving about as you may experience numbness and/or weakness. Use caution in  cooking, using household electrical appliances, and climbing steps. If you need to reach your Doctor call our office: [phone] Monday-Thursday 8:00 am - 4:00 PM    Fridays: Closed     In case of an emergency: In case of emergency, call 911 or go to the nearest emergency room and have the physician there call us.  Interpretation of Procedure Every nerve block has two components: a diagnostic component, and a treatment component. Unrealistic expectations are the most common causes of "perceived failure".  In a perfect world, a single nerve block should be able to completely and permanently eliminate the pain. Sadly, the world is not perfect.  Most pain management nerve blocks are performed using local anesthetics and steroids. Steroids are responsible for any long-term benefit that you may experience. Their purpose is to decrease any chronic swelling that may exist in the area. Steroids begin to work immediately after being injected. However, most patients will not experience any benefits until 5 to 10 days after the injection, when the swelling has come down to the point where they can tell a difference. Steroids will only help if there is swelling to be treated. As such, they can assist with the diagnosis. If effective, they suggest an inflammatory component to the pain, and if ineffective, they rule out inflammation as the main cause or component of the problem. If the problem is one of mechanical compression, you will get no benefit from those steroids.   In the case of local anesthetics, they have a crucial role in the diagnosis of your condition. Most will begin to work within15 to 20 minutes after injection. The duration will depend on the type used (short- vs. Long-acting). It is of outmost importance that patients keep tract of their pain, after the procedure. To assist with this matter, a "Post-procedure Pain Diary" is provided. Make sure to complete it and to bring it back to your  follow-up appointment.  As long as the patient keeps accurate, detailed records of their symptoms after every procedure, and returns to have those interpreted, every procedure will provide us with invaluable information. Even a block that does not provide the patient with any relief, will always provide us with information about the mechanism and the origin of the pain. The only time a nerve block can be considered a waste of time is when patients do not keep track of the results, or do not keep their post-procedure appointment.  Reporting the results back to your physician The Pain Score  Pain is a subjective complaint. It cannot be seen, touched, or measured. We depend entirely on the patient's report of the pain in order to assess your condition and treatment. To evaluate the pain, we use a pain scale, where "0" means "No Pain", and a "10" is "the worst possible pain that you can even imagine" (i.e. something like been eaten alive by a shark or being torn apart by a lion).   You will frequently be asked to rate your pain. Please be as accurate, remember that medical decisions will be based on your responses. Please do not rate your pain above a 10. Doing so is actually interpreted as "symptom magnification" (exaggeration), as   well as lack of understanding with regards to the scale. To put this into perspective, when you tell us that your pain is at a 10 (ten), what you are saying is that there is nothing we can do to make this pain any worse. (Carefully think about that.) Preparing for Procedure with Sedation Instructions: . Oral Intake: Do not eat or drink anything for at least 8 hours prior to your procedure. . Transportation: Public transportation is not allowed. Bring an adult driver. The driver must be physically present in our waiting room before any procedure can be started. . Physical Assistance: Bring an adult capable of physically assisting you, in the event you need help. . Blood Pressure  Medicine: Take your blood pressure medicine with a sip of water the morning of the procedure. . Insulin: Take only  of your normal insulin dose. . Preventing infections: Shower with an antibacterial soap the morning of your procedure. . Build-up your immune system: Take 1000 mg of Vitamin C with every meal (3 times a day) the day prior to your procedure. . Pregnancy: If you are pregnant, call and cancel the procedure. . Sickness: If you have a cold, fever, or any active infections, call and cancel the procedure. . Arrival: You must be in the facility at least 30 minutes prior to your scheduled procedure. . Children: Do not bring children with you. . Dress appropriately: Bring dark clothing that you would not mind if they get stained. . Valuables: Do not bring any jewelry or valuables. Procedure appointments are reserved for interventional treatments only. . No Prescription Refills. . No medication changes will be discussed during procedure appointments. . No disability issues will be discussed. .  

## 2019-07-07 NOTE — Progress Notes (Signed)
Safety precautions to be maintained throughout the outpatient stay will include: orient to surroundings, keep bed in low position, maintain call bell within reach at all times, provide assistance with transfer out of bed and ambulation.  

## 2019-07-07 NOTE — Progress Notes (Signed)
PROVIDER NOTE: Information contained herein reflects review and annotations entered in association with encounter. Interpretation of such information and data should be left to medically-trained personnel. Information provided to patient can be located elsewhere in the medical record under "Patient Instructions". Document created using STT-dictation technology, any transcriptional errors that may result from process are unintentional.    Patient: Beth [name]  Service Category: Procedure  Provider: [name], MD  DOB: 18-Sep-1945  DOS: 07/07/2019  Location: [facility]  MRN: [id number]  Setting: Ambulatory - outpatient  Referring Provider: [name], MD  Type: Established Patient  Specialty: Interventional Pain Management  PCP: Verl Bangs, FNP   Primary Reason for Visit: Interventional Pain Management Treatment. CC: Back Pain  Procedure:          Anesthesia, Analgesia, Anxiolysis:  Type: Thermal Lumbar Facet, Medial Branch Radiofrequency Ablation/Neurotomy           Primary Purpose: Therapeutic Region: Posterolateral Lumbosacral Spine Level: L3, L4, L5, Medial Branch Level(s). These levels will denervate the L3-4, L4-5, and the L5-S1 lumbar facet joints. Laterality: Right  Type: Moderate (Conscious) Sedation combined with Local Anesthesia Indication(s): Analgesia and Anxiety Route: Intravenous (IV) IV Access: Secured Sedation: Meaningful verbal contact was maintained at all times during the procedure  Local Anesthetic: Lidocaine 1-2%  Position: Prone   Indications: 1. Lumbar facet arthropathy   2. Lumbar spondylosis    Beth [name] has been dealing with the above chronic pain for longer than three months and has either failed to respond, was unable to tolerate, or simply did not get enough benefit from other more conservative therapies including, but not limited to: 1. Over-the-counter medications 2. Anti-inflammatory medications 3. Muscle relaxants 4.  Membrane stabilizers 5. Opioids 6. Physical therapy and/or chiropractic manipulation 7. Modalities (Heat, ice, etc.) 8. Invasive techniques such as nerve blocks. Beth [name] has attained more than 50% relief of the pain from a series of diagnostic injections conducted in separate occasions.  Pain Score: Pre-procedure: 10-Worst pain ever/10 Post-procedure: 2 /10  Pre-op Assessment:  Beth [name] is a 74 y.o. (year old), female patient, seen today for interventional treatment. She  has a past surgical history that includes Breast surgery (2011); Cervical discectomy; Appendectomy; Abdominal hysterectomy; Cholecystectomy; Sinusotomy; and Breast excisional biopsy (2011). Beth [name] has a current medication list which includes the following prescription(s): acetaminophen, albuterol, aspirin, atorvastatin, azelastine, breo ellipta, clonazepam, combivent respimat, fluoxetine, gabapentin, loratadine, melatonin, metoprolol succinate, montelukast, seroquel, and furosemide, and the following Facility-Administered Medications: albuterol and fentanyl. Her primarily concern today is the Back Pain  Initial Vital Signs:  Pulse/HCG Rate: 65ECG Heart Rate: 67 Temp: 98.4 F (36.9 C) Resp: 10 BP: 119/63 SpO2: 94 %  BMI: Estimated body mass index is 25.29 kg/m as calculated from the following:   Height as of this encounter: 5\' 5"  (1.651 m).   Weight as of this encounter: 152 lb (68.9 kg).  Risk Assessment: Allergies: Reviewed. She is allergic to bextra  [valdecoxib]; compazine [prochlorperazine edisylate]; lithium carbonate; and lyrica [pregabalin].  Allergy Precautions: None required Coagulopathies: Reviewed. None identified.  Blood-thinner therapy: None at this time Active Infection(s): Reviewed. None identified. Beth [name] is afebrile  Site Confirmation: Beth [name] was asked to confirm the procedure and laterality before marking the site Procedure checklist: Completed Consent: Before the  procedure and under the influence of no sedative(s), amnesic(s), or anxiolytics, the patient was informed of the treatment options, risks and possible complications. To fulfill our ethical and legal obligations, as recommended by  the American Medical Association's Code of Ethics, I have informed the patient of my clinical impression; the nature and purpose of the treatment or procedure; the risks, benefits, and possible complications of the intervention; the alternatives, including doing nothing; the risk(s) and benefit(s) of the alternative treatment(s) or procedure(s); and the risk(s) and benefit(s) of doing nothing. The patient was provided information about the general risks and possible complications associated with the procedure. These may include, but are not limited to: failure to achieve desired goals, infection, bleeding, organ or nerve damage, allergic reactions, paralysis, and death. In addition, the patient was informed of those risks and complications associated to Spine-related procedures, such as failure to decrease pain; infection (i.e.: Meningitis, epidural or intraspinal abscess); bleeding (i.e.: epidural hematoma, subarachnoid hemorrhage, or any other type of intraspinal or peri-dural bleeding); organ or nerve damage (i.e.: Any type of peripheral nerve, nerve root, or spinal cord injury) with subsequent damage to sensory, motor, and/or autonomic systems, resulting in permanent pain, numbness, and/or weakness of one or several areas of the body; allergic reactions; (i.e.: anaphylactic reaction); and/or death. Furthermore, the patient was informed of those risks and complications associated with the medications. These include, but are not limited to: allergic reactions (i.e.: anaphylactic or anaphylactoid reaction(s)); adrenal axis suppression; blood sugar elevation that in diabetics may result in ketoacidosis or comma; water retention that in patients with history of congestive heart failure  may result in shortness of breath, pulmonary edema, and decompensation with resultant heart failure; weight gain; swelling or edema; medication-induced neural toxicity; particulate matter embolism and blood vessel occlusion with resultant organ, and/or nervous system infarction; and/or aseptic necrosis of one or more joints. Finally, the patient was informed that Medicine is not an exact science; therefore, there is also the possibility of unforeseen or unpredictable risks and/or possible complications that may result in a catastrophic outcome. The patient indicated having understood very clearly. We have given the patient no guarantees and we have made no promises. Enough time was given to the patient to ask questions, all of which were answered to the patient's satisfaction. Ms. Leard has indicated that she wanted to continue with the procedure. Attestation: I, the ordering provider, attest that I have discussed with the patient the benefits, risks, side-effects, alternatives, likelihood of achieving goals, and potential problems during recovery for the procedure that I have provided informed consent. Date  Time: 07/07/2019 10:02 AM  Pre-Procedure Preparation:  Monitoring: As per clinic protocol. Respiration, ETCO2, SpO2, BP, heart rate and rhythm monitor placed and checked for adequate function Safety Precautions: Patient was assessed for positional comfort and pressure points before starting the procedure. Time-out: I initiated and conducted the "Time-out" before starting the procedure, as per protocol. The patient was asked to participate by confirming the accuracy of the "Time Out" information. Verification of the correct person, site, and procedure were performed and confirmed by me, the nursing staff, and the patient. "Time-out" conducted as per Joint Commission's Universal Protocol (UP.01.01.01). Time: 1036  Description of Procedure:          Laterality: Right Levels:   L3, L4, L5, Medial  Branch Level(s), at the L3-4, L4-5 lumbar facet joints. Area Prepped: Lumbosacral DuraPrep (Iodine Povacrylex [0.7% available iodine] and Isopropyl Alcohol, 74% w/w) Safety Precautions: Aspiration looking for blood return was conducted prior to all injections. At no point did we inject any substances, as a needle was being advanced. Before injecting, the patient was told to immediately notify me if she was experiencing any new  onset of "ringing in the ears, or metallic taste in the mouth". No attempts were made at seeking any paresthesias. Safe injection practices and needle disposal techniques used. Medications properly checked for expiration dates. SDV (single dose vial) medications used. After the completion of the procedure, all disposable equipment used was discarded in the proper designated medical waste containers. Local Anesthesia: Protocol guidelines were followed. The patient was positioned over the fluoroscopy table. The area was prepped in the usual manner. The time-out was completed. The target area was identified using fluoroscopy. A 12-in long, straight, sterile hemostat was used with fluoroscopic guidance to locate the targets for each level blocked. Once located, the skin was marked with an approved surgical skin marker. Once all sites were marked, the skin (epidermis, dermis, and hypodermis), as well as deeper tissues (fat, connective tissue and muscle) were infiltrated with a small amount of a short-acting local anesthetic, loaded on a 10cc syringe with a 25G, 1.5-in  Needle. An appropriate amount of time was allowed for local anesthetics to take effect before proceeding to the next step. Local Anesthetic: Lidocaine 2.0% The unused portion of the local anesthetic was discarded in the proper designated containers. Technical explanation of process:  Radiofrequency Ablation (RFA)  L3 Medial Branch Nerve RFA: The target area for the L3 medial branch is at the junction of the postero-lateral  aspect of the superior articular process and the superior, posterior, and medial edge of the transverse process of L4. Under fluoroscopic guidance, a Radiofrequency needle was inserted until contact was made with os over the superior postero-lateral aspect of the pedicular shadow (target area). Sensory and motor testing was conducted to properly adjust the position of the needle. Once satisfactory placement of the needle was achieved, the numbing solution was slowly injected after negative aspiration for blood. 2.0 mL of the nerve block solution was injected without difficulty or complication. After waiting for at least 3 minutes, the ablation was performed. Once completed, the needle was removed intact. L4 Medial Branch Nerve RFA: The target area for the L4 medial branch is at the junction of the postero-lateral aspect of the superior articular process and the superior, posterior, and medial edge of the transverse process of L5. Under fluoroscopic guidance, a Radiofrequency needle was inserted until contact was made with os over the superior postero-lateral aspect of the pedicular shadow (target area). Sensory and motor testing was conducted to properly adjust the position of the needle. Once satisfactory placement of the needle was achieved, the numbing solution was slowly injected after negative aspiration for blood. 2.0 mL of the nerve block solution was injected without difficulty or complication. After waiting for at least 3 minutes, the ablation was performed. Once completed, the needle was removed intact. L5 Medial Branch Nerve RFA: The target area for the L5 medial branch is at the junction of the postero-lateral aspect of the superior articular process of S1 and the superior, posterior, and medial edge of the sacral ala. Under fluoroscopic guidance, a Radiofrequency needle was inserted until contact was made with os over the superior postero-lateral aspect of the pedicular shadow (target area). Sensory  and motor testing was conducted to properly adjust the position of the needle. Once satisfactory placement of the needle was achieved, the numbing solution was slowly injected after negative aspiration for blood. 2.0 mL of the nerve block solution was injected without difficulty or complication. After waiting for at least 3 minutes, the ablation was performed. Once completed, the needle was removed intact.  Radiofrequency  lesioning (ablation):  Radiofrequency Generator: NeuroTherm [id number] Sensory Stimulation Parameters: 50 Hz was used to locate & identify the nerve, making sure that the needle was positioned such that there was no sensory stimulation below 0.3 V or above 0.7 V. Motor Stimulation Parameters: 2 Hz was used to evaluate the motor component. Care was taken not to lesion any nerves that demonstrated motor stimulation of the lower extremities at an output of less than 2.5 times that of the sensory threshold, or a maximum of 2.0 V. Lesioning Technique Parameters: Standard Radiofrequency settings. (Not bipolar or pulsed.) Temperature Settings: 80 degrees C Lesioning time: 60 seconds Intra-operative Compliance: Compliant Materials & Medications: Needle(s) (Electrode/Cannula) Type: Teflon-coated, curved tip, Radiofrequency needle(s) Gauge: 22G Length: 10cm Numbing solution: 6 cc solution made of 5 cc of 0.2% ropivacaine, 1 cc of Decadron 10 mg/cc.  2 cc injected at each level above on the right prior to lesioning after sensorimotor testing.    Once the entire procedure was completed, the treated area was cleaned, making sure to leave some of the prepping solution back to take advantage of its long term bactericidal properties.  Illustration of the posterior view of the lumbar spine and the posterior neural structures. Laminae of L2 through S1 are labeled. DPRL5, dorsal primary ramus of L5; DPRS1, dorsal primary ramus of S1; DPR3, dorsal primary ramus of L3; FJ, facet (zygapophyseal) joint  L3-L4; I, inferior articular process of L4; LB1, lateral branch of dorsal primary ramus of L1; IAB, inferior articular branches from L3 medial branch (supplies L4-L5 facet joint); IBP, intermediate branch plexus; MB3, medial branch of dorsal primary ramus of L3; NR3, third lumbar nerve root; S, superior articular process of L5; SAB, superior articular branches from L4 (supplies L4-5 facet joint also); TP3, transverse process of L3.  Vitals:   07/07/19 1051 07/07/19 1101 07/07/19 1111 07/07/19 1121  BP: 123/69 122/65 (!) 113/56 129/60  Pulse:      Resp: 12 14 15 16   Temp:  (!) 97.2 F (36.2 C)    SpO2: 100% 96% 95% 98%  Weight:      Height:       Start Time: 1036 hrs. End Time: 1050 hrs.  Imaging Guidance (Spinal):          Type of Imaging Technique: Fluoroscopy Guidance (Spinal) Indication(s): Assistance in needle guidance and placement for procedures requiring needle placement in or near specific anatomical locations not easily accessible without such assistance. Exposure Time: Please see nurses notes. Contrast: None used. Fluoroscopic Guidance: I was personally present during the use of fluoroscopy. "Tunnel Vision Technique" used to obtain the best possible view of the target area. Parallax error corrected before commencing the procedure. "Direction-depth-direction" technique used to introduce the needle under continuous pulsed fluoroscopy. Once target was reached, antero-posterior, oblique, and lateral fluoroscopic projection used confirm needle placement in all planes. Images permanently stored in EMR. Interpretation: No contrast injected. I personally interpreted the imaging intraoperatively. Adequate needle placement confirmed in multiple planes. Permanent images saved into the patient's record.  Antibiotic Prophylaxis:   Anti-infectives (From admission, onward)   None     Indication(s): None identified  Post-operative Assessment:  Post-procedure Vital Signs:  Pulse/HCG Rate:  6565 Temp: (!) 97.2 F (36.2 C) Resp: 16 BP: 129/60 SpO2: 98 %  EBL: None  Complications: No immediate post-treatment complications observed by team, or reported by patient.  Note: The patient tolerated the entire procedure well. A repeat set of vitals were taken after the procedure and the patient was  kept under observation following institutional policy, for this type of procedure. Post-procedural neurological assessment was performed, showing return to baseline, prior to discharge. The patient was provided with post-procedure discharge instructions, including a section on how to identify potential problems. Should any problems arise concerning this procedure, the patient was given instructions to immediately contact us, at any time, without hesitation. In any case, we plan to contact the patient by telephone for a follow-up status report regarding this interventional procedure.  Comments:  No additional relevant information.  Plan of Care  Orders:  Orders Placed This Encounter  Procedures  . [facility] C-ARM 1-60 MIN NO REPORT    Intraoperative interpretation by procedural physician at [facility].    Standing Status:   Standing    Number of Occurrences:   1    Order Specific Question:   Reason for exam:    Answer:   Assistance in needle guidance and placement for procedures requiring needle placement in or near specific anatomical locations not easily accessible without such assistance.   Medications ordered for procedure: Meds ordered this encounter  Medications  . lidocaine (XYLOCAINE) 2 % (with pres) injection 400 mg  . fentaNYL (SUBLIMAZE) injection 25-50 mcg    Make sure Narcan is available in the pyxis when using this medication. In the event of respiratory depression (RR< 8/min): Titrate NARCAN (naloxone) in increments of 0.1 to 0.2 mg IV at 2-3 minute intervals, until desired degree of reversal.  . ropivacaine (PF) 2 mg/mL (0.2%) (NAROPIN) injection 2 mL   . dexamethasone (DECADRON) injection 10 mg   Medications administered: We administered lidocaine, fentaNYL, ropivacaine (PF) 2 mg/mL (0.2%), and dexamethasone.  See the medical record for exact dosing, route, and time of administration.  Follow-up plan:   Return for [facility] appt, Contra-lateral RFA.      Status post bilateral facet medial branch nerve blocks L3, L4, L5 on 09/03/2017, helpful for axial low back and buttock pain: Repeat PRN.  Status post right L4-L5 epidural steroid injection on 03/09/2018, 07/29/2018 which was helpful for her right-sided hip and leg pain.  Right L4-L5 ESI #3 on 09/23/2027 not as helpful.  Right L3, L4, L5 RFA on 07/07/2019      Recent Visits Date Type Provider Dept  06/16/19 Telemedicine [name], MD [facility]  05/10/19 Procedure visit [name], MD [facility]  05/04/19 Office Visit [name], MD [facility]  Showing recent visits within past 90 days and meeting all other requirements   Today's Visits Date Type Provider Dept  07/07/19 Procedure visit [name], MD [facility]  Showing today's visits and meeting all other requirements   Future Appointments Date Type Provider Dept  07/21/19 Appointment [name], MD [facility]  Showing future appointments within next 90 days and meeting all other requirements   Disposition: Discharge home  Discharge (Date  Time): 07/07/2019; 1121 hrs.   Primary Care Physician: Verl Bangs, FNP Location: [facility] Note by: [name], MD Date: 07/07/2019; Time: 4:34 PM  Disclaimer:  Medicine is not an exact science. The only guarantee in medicine is that nothing is guaranteed. It is important to note that the decision to proceed with this intervention was based on the information collected from the patient. The Data and conclusions were drawn from the patient's questionnaire, the interview, and the physical  examination. Because the information was provided in large part by the patient, it cannot be guaranteed  that it has not been purposely or unconsciously manipulated. Every effort has been made to obtain as much relevant data as possible for this evaluation. It is important to note that the conclusions that lead to this procedure are derived in large part from the available data. Always take into account that the treatment will also be dependent on availability of resources and existing treatment guidelines, considered by other Pain Management Practitioners as being common knowledge and practice, at the time of the intervention. For Medico-Legal purposes, it is also important to point out that variation in procedural techniques and pharmacological choices are the acceptable norm. The indications, contraindications, technique, and results of the above procedure should only be interpreted and judged by a Board-Certified Interventional Pain Specialist with extensive familiarity and expertise in the same exact procedure and technique.

## 2019-07-08 ENCOUNTER — Telehealth: Payer: Self-pay | Admitting: *Deleted

## 2019-07-08 NOTE — Telephone Encounter (Signed)
No problems post procedure. 

## 2019-07-09 ENCOUNTER — Other Ambulatory Visit: Payer: Self-pay

## 2019-07-09 ENCOUNTER — Encounter: Payer: Self-pay | Admitting: Family Medicine

## 2019-07-09 ENCOUNTER — Other Ambulatory Visit: Payer: Self-pay | Admitting: Family Medicine

## 2019-07-09 ENCOUNTER — Ambulatory Visit (INDEPENDENT_AMBULATORY_CARE_PROVIDER_SITE_OTHER): Payer: Medicare HMO | Admitting: Family Medicine

## 2019-07-09 VITALS — BP 119/52 | HR 69 | Temp 97.7°F | Resp 18 | Ht 65.0 in | Wt 158.6 lb

## 2019-07-09 DIAGNOSIS — M7989 Other specified soft tissue disorders: Secondary | ICD-10-CM

## 2019-07-09 DIAGNOSIS — I10 Essential (primary) hypertension: Secondary | ICD-10-CM

## 2019-07-09 DIAGNOSIS — J449 Chronic obstructive pulmonary disease, unspecified: Secondary | ICD-10-CM

## 2019-07-09 DIAGNOSIS — R8290 Unspecified abnormal findings in urine: Secondary | ICD-10-CM | POA: Diagnosis not present

## 2019-07-09 LAB — POCT URINALYSIS DIPSTICK
Bilirubin, UA: NEGATIVE
Blood, UA: NEGATIVE
Glucose, UA: NEGATIVE
Ketones, UA: NEGATIVE
Nitrite, UA: NEGATIVE
Protein, UA: NEGATIVE
Spec Grav, UA: 1.01 (ref 1.010–1.025)
Urobilinogen, UA: 0.2 E.U./dL
pH, UA: 5 (ref 5.0–8.0)

## 2019-07-09 MED ORDER — ALBUTEROL SULFATE HFA 108 (90 BASE) MCG/ACT IN AERS: 1.0000 | INHALATION_SPRAY | Freq: Four times a day (QID) | RESPIRATORY_TRACT | 0 refills | Status: DC | PRN

## 2019-07-09 NOTE — Assessment & Plan Note (Signed)
Controlled hypertension.  BP is at goal < 130/80.  Pt is working on lifestyle modifications.  Taking medications tolerating well without side effects.   Plan: 1. Continue taking Metoprolol succinate 25mg  daily 2. Obtain labs before next visit  3. Encouraged heart healthy diet and increasing exercise to 30 minutes most days of the week, going no more than 2 days in a row without exercise. 4. Check BP 1-2 x per week at home, keep log, and bring to clinic at next appointment. 5. Follow up 3 months.

## 2019-07-09 NOTE — Assessment & Plan Note (Signed)
Has continued to use her Breao Ellipta daily, singulair 2m daily, and albuterol inhaler PRN.  Reports subjective shortness of breath x 6 months without improvement in symptoms.  Has not formally met with pulmonary for evaluation/treatment.  Plan: 1. Continue current medications 2. Referral placed to pulmonary for evaluation/PFTs

## 2019-07-09 NOTE — Progress Notes (Signed)
Subjective:    Patient ID: Beth [name], female    DOB: [birth year], 74 y.o.   MRN: [id number]  Beth [name] is a 74 y.o. female presenting on 07/09/2019 for Shortness of Breath (pt state her SOB has worsening over the past 6 mths ) and Leg Swelling (bilateral leg swelling & pain. Pt said she had a procedure x 3 days ago. interventional treatment. done at her Pain Management appt.)   HPI  Beth [name] presents to clinic for concerns of her shortness of breath over the past 6 months and for bilateral lower extremity swelling.  Reports she has been seen by Cardiology for the lower extremity swelling and her tests were negative, but has not had additional follow up since her initial visit.  Has been wearing compression socks in the past that helped alleviate her swelling but does not wear in the summer.  Has been having subjective increase in shortness of breath x 6 months.  Has not met with pulmonary in the past for evaluation/management of her COPD.  Denies dizziness, lightheadedness, headaches, visual changes, difficulty breathing, CP, abdominal pain, n/v/d.  Hypertension - She is not checking BP at home or outside of clinic.    - Current medications: metoprolol succinate (Toprol-XL) 25m daily, tolerating well without side effects - She is not currently symptomatic. - Pt denies headache, lightheadedness, dizziness, changes in vision, chest tightness/pressure, palpitations, sudden loss of speech or loss of consciousness. - She  reports no regular exercise routine. - Her diet is high in salt, high in fat, and high in carbohydrates.  [facility] 07/09/2019 03/23/2019 03/22/2019  Decreased Interest 1 0 0  Down, Depressed, Hopeless '1 1 1  ' PHQ - 2 Score '2 1 1  ' Altered sleeping 0 - -  Tired, decreased energy 0 - -  Change in appetite 0 - -  Feeling bad or failure about yourself  1 - -  Trouble concentrating 0 - -  Moving slowly or fidgety/restless 0 - -  Suicidal thoughts 0  - -  PHQ-9 Score 3 - -  Difficult doing work/chores Somewhat difficult - -  Some recent data might be hidden    Social History   Tobacco Use  . Smoking status: Light Tobacco Smoker    Packs/day: 0.25    Years: 35.00    Pack years: 8.75    Types: Cigarettes    Last attempt to quit: 12/28/2018    Years since quitting: 0.5  . Smokeless tobacco: Never Used  Vaping Use  . Vaping Use: Never used  Substance Use Topics  . Alcohol use: No    Alcohol/week: 0.0 standard drinks  . Drug use: No    Review of Systems  Constitutional: Negative.   HENT: Negative.   Eyes: Negative.   Respiratory: Negative.   Cardiovascular: Positive for leg swelling. Negative for chest pain and palpitations.  Gastrointestinal: Negative.   Endocrine: Negative.   Genitourinary: Negative.   Musculoskeletal: Negative.   Skin: Negative.   Allergic/Immunologic: Negative.   Neurological: Negative.   Hematological: Negative.   Psychiatric/Behavioral: Negative.    Per HPI unless specifically indicated above     Objective:    BP (!) 119/52 (BP Location: Right Arm, Patient Position: Sitting, Cuff Size: Normal)   Pulse 69   Temp 97.7 F (36.5 C) (Temporal)   Resp 18   Ht '5\' 5"'  (1.651 m)   Wt 158 lb 9.6 oz (71.9 kg)   SpO2 97%   BMI 26.39  kg/m   Wt Readings from Last 3 Encounters:  07/09/19 158 lb 9.6 oz (71.9 kg)  07/07/19 152 lb (68.9 kg)  05/17/19 157 lb 9.6 oz (71.5 kg)    Physical Exam Vitals reviewed.  Constitutional:      General: She is not in acute distress.    Appearance: Normal appearance. She is well-developed, well-groomed and overweight. She is not ill-appearing or toxic-appearing.  HENT:     Head: Normocephalic and atraumatic.     Nose:     Comments: Lizbeth Bark is in place, covering mouth and nose  Eyes:     General: Lids are normal. Vision grossly intact.        Right eye: No discharge.        Left eye: No discharge.     Conjunctiva/sclera: Conjunctivae normal.    Cardiovascular:     Rate and Rhythm: Normal rate and regular rhythm.     Pulses: Normal pulses.          Dorsalis pedis pulses are 2+ on the right side and 2+ on the left side.     Heart sounds: Normal heart sounds.  Pulmonary:     Effort: Pulmonary effort is normal. No respiratory distress.     Breath sounds: Normal breath sounds.  Musculoskeletal:     Right lower leg: 2+ Edema present.     Left lower leg: 2+ Edema present.     Comments: Normal tone 5/5 strength BUE & BLE  Feet:     Right foot:     Skin integrity: Skin integrity normal.     Left foot:     Skin integrity: Skin integrity normal.  Skin:    General: Skin is warm and dry.     Capillary Refill: Capillary refill takes less than 2 seconds.  Neurological:     General: No focal deficit present.     Mental Status: She is alert and oriented to person, place, and time.     Cranial Nerves: No cranial nerve deficit.     Sensory: No sensory deficit.     Motor: No weakness.     Coordination: Coordination normal.     Gait: Gait normal.  Psychiatric:        Attention and Perception: Attention and perception normal.        Mood and Affect: Mood and affect normal. Mood is not anxious.        Speech: Speech normal.        Behavior: Behavior normal. Behavior is cooperative.        Thought Content: Thought content normal.        Cognition and Memory: Cognition and memory normal.        Judgment: Judgment normal.     Results for orders placed or performed in visit on 07/09/19  POCT Urinalysis Dipstick  Result Value Ref Range   Color, UA Yellow    Clarity, UA Clear    Glucose, UA Negative Negative   Bilirubin, UA negative    Ketones, UA negative    Spec Grav, UA 1.010 1.010 - 1.025   Blood, UA negative    pH, UA 5.0 5.0 - 8.0   Protein, UA Negative Negative   Urobilinogen, UA 0.2 0.2 or 1.0 E.U./dL   Nitrite, UA negative    Leukocytes, UA Large (3+) (A) Negative   Appearance     Odor        Assessment & Plan:    Problem List Items Addressed This Visit  Cardiovascular and Mediastinum   Benign hypertension - Primary    Controlled hypertension.  BP is at goal < 130/80.  Pt is working on lifestyle modifications.  Taking medications tolerating well without side effects.   Plan: 1. Continue taking Metoprolol succinate 30m daily 2. Obtain labs before next visit  3. Encouraged heart healthy diet and increasing exercise to 30 minutes most days of the week, going no more than 2 days in a row without exercise. 4. Check BP 1-2 x per week at home, keep log, and bring to clinic at next appointment. 5. Follow up 3 months.         Relevant Orders   POCT Urinalysis Dipstick (Completed)     Respiratory   COPD, severe ([place]    Has continued to use her Breao Ellipta daily, singulair 156mdaily, and albuterol inhaler PRN.  Reports subjective shortness of breath x 6 months without improvement in symptoms.  Has not formally met with pulmonary for evaluation/treatment.  Plan: 1. Continue current medications 2. Referral placed to pulmonary for evaluation/PFTs      Relevant Medications   albuterol (VENTOLIN HFA) 108 (90 Base) MCG/ACT inhaler   Other Relevant Orders   Ambulatory referral to Pulmonology     Other   Leg swelling    +2 nonpitting edema in bilateral lower extremities that resolves with using compression stockings and elevation.  Has met with cardiology and reports normal stress test, echo and vascular doppler.  Has not completed follow up visit with Cardiology as of yet.  Plan: 1. Continue to wear compression stockings 2. Elevate feet when resting 3. Reschedule missed cardiology appointment       Other Visit Diagnoses    Abnormal urinalysis       Relevant Orders   Urine Culture      Meds ordered this encounter  Medications  . albuterol (VENTOLIN HFA) 108 (90 Base) MCG/ACT inhaler    Sig: Inhale 1-2 puffs into the lungs every 6 (six) hours as needed for wheezing or shortness of  breath. Use with spacer    Dispense:  8 g    Refill:  0    Provide spacer and instructions to patient      Follow up plan: Return in about 3 months (around 10/09/2019) for COPD and HTN Follow Up.   NiHarlin RainFNFaywoodamily Nurse Practitioner [facility] 07/09/2019, 1:07 PM

## 2019-07-09 NOTE — Assessment & Plan Note (Signed)
+  2 nonpitting edema in bilateral lower extremities that resolves with using compression stockings and elevation.  Has met with cardiology and reports normal stress test, echo and vascular doppler.  Has not completed follow up visit with Cardiology as of yet.  Plan: 1. Continue to wear compression stockings 2. Elevate feet when resting 3. Reschedule missed cardiology appointment

## 2019-07-09 NOTE — Patient Instructions (Signed)
Your medication refills have been sent to your pharmacy on file.  A referral to Pulmonology has been placed today.  If you have not heard from the specialty office or our referral coordinator within 1 week, please let us know and we will follow up with the referral coordinator for an update.  Continue all of your medications as prescribed.  We will plan to see you back in 3 months for hypertension and COPD follow up  You will receive a survey after today's visit either digitally by e-mail or paper by [place] mail. Your experiences and feedback matter to [place].  Please respond so we know how we are doing as we provide care for you.  Call us with any questions/concerns/needs.  It is my goal to be available to you for your health concerns.  Thanks for choosing me to be a partner in your healthcare needs!  Harlin Rain, FNP-C Family Nurse Practitioner [facility] Phone: [phone]

## 2019-07-11 LAB — URINE CULTURE
MICRO NUMBER:: 10585651
SPECIMEN QUALITY:: ADEQUATE

## 2019-07-12 ENCOUNTER — Other Ambulatory Visit: Payer: Self-pay | Admitting: Family Medicine

## 2019-07-12 DIAGNOSIS — N390 Urinary tract infection, site not specified: Secondary | ICD-10-CM

## 2019-07-12 MED ORDER — NITROFURANTOIN MONOHYD MACRO 100 MG PO CAPS: 100.0000 mg | ORAL_CAPSULE | Freq: Two times a day (BID) | ORAL | 0 refills | Status: AC

## 2019-07-14 ENCOUNTER — Ambulatory Visit (INDEPENDENT_AMBULATORY_CARE_PROVIDER_SITE_OTHER): Payer: Medicare HMO | Admitting: Pharmacist

## 2019-07-14 DIAGNOSIS — N390 Urinary tract infection, site not specified: Secondary | ICD-10-CM

## 2019-07-14 DIAGNOSIS — I10 Essential (primary) hypertension: Secondary | ICD-10-CM

## 2019-07-14 DIAGNOSIS — F332 Major depressive disorder, recurrent severe without psychotic features: Secondary | ICD-10-CM | POA: Diagnosis not present

## 2019-07-14 DIAGNOSIS — R69 Illness, unspecified: Secondary | ICD-10-CM | POA: Diagnosis not present

## 2019-07-14 NOTE — Chronic Care Management (AMB) (Signed)
Chronic Care Management   Follow Up Note   07/14/2019 Name: Beth [name] MRN: [id number] DOB: 12-Aug-1945  Referred by: Verl Bangs, FNP Reason for referral : No chief complaint on file.   Beth [name] is a 74 y.o. year old female who is a primary care patient of Lorine Bears, Lupita Raider, FNP. The CCM team was consulted for assistance with chronic disease management and care coordination needs.    I reached out to [name] by phone today.   Review of patient status, including review of consultants reports, relevant laboratory and other test results, and collaboration with appropriate care team members and the patient's provider was performed as part of comprehensive patient evaluation and provision of chronic care management services.     Outpatient Encounter Medications as of 07/14/2019  Medication Sig  . furosemide (LASIX) 20 MG tablet Take 1 tablet (20 mg total) by mouth daily.  . metoprolol succinate (TOPROL-XL) 25 MG 24 hr tablet TAKE 1/2 TABLET(12.5 MG) BY MOUTH DAILY  . nitrofurantoin, macrocrystal-monohydrate, (MACROBID) 100 MG capsule Take 1 capsule (100 mg total) by mouth 2 (two) times daily for 5 days.  Marland Kitchen acetaminophen (TYLENOL) 500 MG tablet Take 500 mg by mouth 2 (two) times daily as needed.   Marland Kitchen albuterol (VENTOLIN HFA) 108 (90 Base) MCG/ACT inhaler Inhale 1-2 puffs into the lungs every 6 (six) hours as needed for wheezing or shortness of breath. Use with spacer  . aspirin 81 MG chewable tablet Chew 81 mg daily by mouth.  Marland Kitchen atorvastatin (LIPITOR) 40 MG tablet TAKE 1 TABLET BY MOUTH EVERY DAY  . azelastine (ASTELIN) 0.1 % nasal spray Place 2 sprays into both nostrils 2 (two) times daily.  Marland Kitchen BREO ELLIPTA 100-25 MCG/INH AEPB INHALE 1 PUFF INTO THE LUNGS DAILY  . clonazePAM (KLONOPIN) 0.5 MG tablet TK 1 T PO DAILY PRN  . COMBIVENT RESPIMAT 20-100 MCG/ACT AERS respimat INHALE 1 PUFF BY MOUTH EVERY 6 HOURS  . FLUoxetine (PROZAC) 20 MG tablet Take 60 mg by mouth daily.     Marland Kitchen gabapentin (NEURONTIN) 300 MG capsule Take 2 capsules (600 mg total) by mouth 3 (three) times daily.  Marland Kitchen loratadine (CLARITIN) 10 MG tablet Take 1 tablet (10 mg total) by mouth daily. (Patient not taking: Reported on 07/09/2019)  . Melatonin 3 MG CAPS Take 10 mg by mouth at bedtime as needed (sleep).   . montelukast (SINGULAIR) 10 MG tablet TAKE 1 TABLET(10 MG) BY MOUTH AT BEDTIME  . SEROQUEL 100 MG tablet Take 100 mg by mouth at bedtime.   Facility-Administered Encounter Medications as of 07/14/2019  Medication  . albuterol (PROVENTIL) (2.5 MG/3ML) 0.083% nebulizer solution 2.5 mg    Goals Addressed            This Visit's Progress   . PharmD - Medication Management       CARE PLAN ENTRY (see longtitudinal plan of care for additional care plan information)   Current Barriers:  . Chronic Disease Management support, education, and care coordination needs related to previous CVA, COPD, hypertension, hyperlipidemia, depression  Pharmacist Clinical Goal(s):  Marland Kitchen Over the next 30 days, patient will work with CM Pharmacist to complete medication review address needs identified  Interventions: . Perform chart review. Note patient seen by PCP on 6/11 . Counsel on importance of blood pressure monitoring and control ? Reports taking: ? Metoprolol ER 25 mg - 1/2 tablet daily ? Furosemide 20 mg - 1 tablet daily ? Note patient has home upper  BP monitor ? Reports monitoring home BP as directed ? Reports last checked yesterday, recalls BP ~130/70s, but did not record as ran out of space on BP log ? Will mail patient new BP log as requested . Encourage patient to continue to monitor, keep log of BP and daily weights and bring record to medical appointments ? Remind patient to call provider for readings outside of established parameters or for new or worsening symptoms . Counsel on importance of medication adherence.  ? Again encourage patient to obtain a weekly pillbox ? Patient confirms  taking course of Macrobid 100 mg twice daily as prescribed by PCP, started on 6/15 ? Counsel patient on importance of completing 5 day course . Coordination of care ? Confirm patient scheduled appointment with pulmonology as referred by PCP ? Encourage patient to reschedule missed appointment with Cardiology. Provide patient with phone number  Patient Self Care Activities:  . To self administers medications as prescribed o Encourage patient to obtain weekly pillbox to use as adherence tool . Attends all scheduled provider appointments o Appointment with Pulmonology on 7/20 . Calls pharmacy for medication refills . Calls provider office for new concerns or questions  Please see past updates related to this goal by clicking on the "Past Updates" button in the selected goal         Plan  Telephone follow up appointment with care management team member scheduled for: 7/9 at 12 pm  [name], PharmD, [place] [phone]

## 2019-07-14 NOTE — Patient Instructions (Signed)
Thank you allowing the Chronic Care Management Team to be a part of your care! It was a pleasure speaking with you today!     CCM (Chronic Care Management) Team    [name] RN, MSN, CCM Nurse Care Coordinator  [phone]   [name] PharmD  Clinical Pharmacist  [phone]   Eula Fried LCSW Clinical Social Worker [phone]  Visit Information  Goals Addressed            This Visit's Progress   . PharmD - Medication Management       CARE PLAN ENTRY (see longtitudinal plan of care for additional care plan information)   Current Barriers:  . Chronic Disease Management support, education, and care coordination needs related to previous CVA, COPD, hypertension, hyperlipidemia, depression  Pharmacist Clinical Goal(s):  Marland Kitchen Over the next 30 days, patient will work with CM Pharmacist to complete medication review address needs identified  Interventions: . Perform chart review. Note patient seen by PCP on 6/11 . Counsel on importance of blood pressure monitoring and control ? Reports taking: ? Metoprolol ER 25 mg - 1/2 tablet daily ? Furosemide 20 mg - 1 tablet daily ? Note patient has home upper BP monitor ? Reports monitoring home BP as directed ? Reports last checked yesterday, recalls BP ~130/70s, but did not record as ran out of space on BP log ? Will mail patient new BP log as requested . Encourage patient to continue to monitor, keep log of BP and daily weights and bring record to medical appointments ? Remind patient to call provider for readings outside of established parameters or for new or worsening symptoms . Counsel on importance of medication adherence.  ? Again encourage patient to obtain a weekly pillbox ? Patient confirms taking course of Macrobid 100 mg twice daily as prescribed by PCP, started on 6/15 ? Counsel patient on importance of completing 5 day course . Coordination of care ? Confirm patient scheduled appointment with  pulmonology as referred by PCP ? Encourage patient to reschedule missed appointment with Cardiology. Provide patient with phone number  Patient Self Care Activities:  . To self administers medications as prescribed o Encourage patient to obtain weekly pillbox to use as adherence tool . Attends all scheduled provider appointments o Appointment with Pulmonology on 7/20 . Calls pharmacy for medication refills . Calls provider office for new concerns or questions  Please see past updates related to this goal by clicking on the "Past Updates" button in the selected goal         Print copy of patient instructions provided.   Telephone follow up appointment with care management team member scheduled for: 7/9 at 12 pm  [name], PharmD, [place] [phone]

## 2019-07-15 ENCOUNTER — Ambulatory Visit: Payer: Self-pay

## 2019-07-15 DIAGNOSIS — M797 Fibromyalgia: Secondary | ICD-10-CM

## 2019-07-15 DIAGNOSIS — I10 Essential (primary) hypertension: Secondary | ICD-10-CM

## 2019-07-15 DIAGNOSIS — J449 Chronic obstructive pulmonary disease, unspecified: Secondary | ICD-10-CM

## 2019-07-15 DIAGNOSIS — F332 Major depressive disorder, recurrent severe without psychotic features: Secondary | ICD-10-CM

## 2019-07-15 DIAGNOSIS — I69359 Hemiplegia and hemiparesis following cerebral infarction affecting unspecified side: Secondary | ICD-10-CM

## 2019-07-15 NOTE — Chronic Care Management (AMB) (Signed)
Chronic Care Management    Clinical Social Work Follow Up Note  07/15/2019 Name: Beth [name] MRN: [id number] DOB: 10-08-1945  Beth [name] is a 74 y.o. year old female who is a primary care patient of Verl Bangs, FNP. The CCM team was consulted for assistance with Mental Health Counseling and Resources.   Review of patient status, including review of consultants reports, other relevant assessments, and collaboration with appropriate care team members and the patient's provider was performed as part of comprehensive patient evaluation and provision of chronic care management services.    SDOH (Social Determinants of Health) assessments performed: Yes    Outpatient Encounter Medications as of 07/15/2019  Medication Sig  . acetaminophen (TYLENOL) 500 MG tablet Take 500 mg by mouth 2 (two) times daily as needed.   Marland Kitchen albuterol (VENTOLIN HFA) 108 (90 Base) MCG/ACT inhaler Inhale 1-2 puffs into the lungs every 6 (six) hours as needed for wheezing or shortness of breath. Use with spacer  . aspirin 81 MG chewable tablet Chew 81 mg daily by mouth.  Marland Kitchen atorvastatin (LIPITOR) 40 MG tablet TAKE 1 TABLET BY MOUTH EVERY DAY  . azelastine (ASTELIN) 0.1 % nasal spray Place 2 sprays into both nostrils 2 (two) times daily.  Marland Kitchen BREO ELLIPTA 100-25 MCG/INH AEPB INHALE 1 PUFF INTO THE LUNGS DAILY  . clonazePAM (KLONOPIN) 0.5 MG tablet TK 1 T PO DAILY PRN  . COMBIVENT RESPIMAT 20-100 MCG/ACT AERS respimat INHALE 1 PUFF BY MOUTH EVERY 6 HOURS  . FLUoxetine (PROZAC) 20 MG tablet Take 60 mg by mouth daily.   . furosemide (LASIX) 20 MG tablet Take 1 tablet (20 mg total) by mouth daily.  Marland Kitchen gabapentin (NEURONTIN) 300 MG capsule Take 2 capsules (600 mg total) by mouth 3 (three) times daily.  Marland Kitchen loratadine (CLARITIN) 10 MG tablet Take 1 tablet (10 mg total) by mouth daily. (Patient not taking: Reported on 07/09/2019)  . Melatonin 3 MG CAPS Take 10 mg by mouth at bedtime as needed (sleep).   . metoprolol  succinate (TOPROL-XL) 25 MG 24 hr tablet TAKE 1/2 TABLET(12.5 MG) BY MOUTH DAILY  . montelukast (SINGULAIR) 10 MG tablet TAKE 1 TABLET(10 MG) BY MOUTH AT BEDTIME  . nitrofurantoin, macrocrystal-monohydrate, (MACROBID) 100 MG capsule Take 1 capsule (100 mg total) by mouth 2 (two) times daily for 5 days.  . SEROQUEL 100 MG tablet Take 100 mg by mouth at bedtime.   Facility-Administered Encounter Medications as of 07/15/2019  Medication  . albuterol (PROVENTIL) (2.5 MG/3ML) 0.083% nebulizer solution 2.5 mg     Goals Addressed    .  SW: I have a lot of stress right now (pt-stated)        Current Barriers:  . Chronic Mental Health needs related to depression . Financial constraints related to managing health care expenses . Limited social support . ADL IADL limitations . Limited access to caregiver . Inability to perform ADL's independently . Suicidal Ideation/Homicidal Ideation: No  Clinical Social Work Goal(s):  Marland Kitchen Over the next 120 days, patient will work with SW  bi-monthly  by telephone or in person to reduce or manage symptoms related to depression . Over the next 120 days, patient will work with SW to address concerns related to decreasing depression and increasing self-care  Interventions: . Patient interviewed and appropriate assessments performed: brief mental health assessment . Provided mental health counseling with regard to depression. Patient reports feeling " a little better" She shares that she is trying to manage her  depression as best as she can. LCSW educated patient on coping methods to implement into her daily life to combat depressive symptoms and stress. Patient denied any suicidal or homicidal ideations. Encouraged patient to implement deep breathing exercises into her daily routine due to ongoing stress. Patient reports that she is actively working on both her physical and mental health. She shares that her legs continue to swell even though she is now on Lasix. Patient  reports that she is using her compression stockings and elevating her legs as needed.  . Discussed plans with patient for ongoing care management follow up and provided patient with direct contact information for care management team . Provided education and assistance to client regarding Advanced Directives. . A voluntary and extensive discussion about advanced care planning including explanation and discussion of advanced was undertaken with the patient. Explanation regarding healthcare proxy and living will was reviewed and packet with forms with explanation of how to fill them out was given.   . Provided education to patient/caregiver about Hospice and/or Palliative Care services . Mindfulness or Relaxation Training provided during session for patient to implement when faced with future triggers . Patient reports implementing appropriate self-care by getting up each day and walking outside to get some fresh air. LCSW provided positive reinforcement for this accomplishment. . Patient denies any issues with her sleep routine. She reports that she takes a melatonin vitamin every night which helps her to unwind from the day.    Patient Self Care Activities:  . Calls provider office for new concerns or questions . Ability for insight . Motivation for treatment  Patient Coping Strengths:  . Hopefulness . Self Advocate . Able to Communicate Effectively  Patient Self Care Deficits:  . Lacks social connections  Please see past updates related to this goal by clicking on the "Past Updates" button in the selected goal       Follow Up Plan: SW will follow up with patient by phone over the next quarter  Eula Fried, [place], MSW, [email or web address] Phone: [phone]

## 2019-07-21 ENCOUNTER — Telehealth: Payer: Self-pay | Admitting: Student in an Organized Health Care Education/Training Program

## 2019-07-21 ENCOUNTER — Ambulatory Visit: Payer: Medicare HMO | Admitting: Student in an Organized Health Care Education/Training Program

## 2019-07-21 NOTE — Telephone Encounter (Signed)
Patient lvmail 2:58 Wed stating she is having a lot of pain since procedure on 07-07-19. She canceled her 2nd RFA due to this. Wants to know if this is normal and if anything can be done to ease her pain.

## 2019-07-21 NOTE — Telephone Encounter (Signed)
Left message on Wednesday?  What Wednesday?I see no record of this. Patient should be scheduled for an eval appointment to discuss this with Dr. Holley Raring. Please call and schedule. VV is ok.

## 2019-07-22 ENCOUNTER — Ambulatory Visit: Payer: Self-pay | Admitting: General Practice

## 2019-07-22 ENCOUNTER — Telehealth: Payer: Self-pay | Admitting: General Practice

## 2019-07-22 DIAGNOSIS — F332 Major depressive disorder, recurrent severe without psychotic features: Secondary | ICD-10-CM

## 2019-07-22 DIAGNOSIS — R69 Illness, unspecified: Secondary | ICD-10-CM | POA: Diagnosis not present

## 2019-07-22 DIAGNOSIS — J449 Chronic obstructive pulmonary disease, unspecified: Secondary | ICD-10-CM | POA: Diagnosis not present

## 2019-07-22 DIAGNOSIS — I10 Essential (primary) hypertension: Secondary | ICD-10-CM

## 2019-07-22 DIAGNOSIS — M7989 Other specified soft tissue disorders: Secondary | ICD-10-CM

## 2019-07-22 NOTE — Chronic Care Management (AMB) (Signed)
Chronic Care Management   Follow Up Note   07/22/2019 Name: Beth [name] MRN: [id number] DOB: 06-03-1945  Referred by: Verl Bangs, FNP Reason for referral : Chronic Care Management (Follow up: RNCM Chronic disease management and care coordination needs)   Beth [name] is a 74 y.o. year old female who is a primary care patient of Verl Bangs, FNP. The CCM team was consulted for assistance with chronic disease management and care coordination needs.    Review of patient status, including review of consultants reports, relevant laboratory and other test results, and collaboration with appropriate care team members and the patient's provider was performed as part of comprehensive patient evaluation and provision of chronic care management services.    SDOH (Social Determinants of Health) assessments performed: Yes See Care Plan activities for detailed interventions related to [facility])     Outpatient Encounter Medications as of 07/22/2019  Medication Sig  . acetaminophen (TYLENOL) 500 MG tablet Take 500 mg by mouth 2 (two) times daily as needed.   Marland Kitchen albuterol (VENTOLIN HFA) 108 (90 Base) MCG/ACT inhaler Inhale 1-2 puffs into the lungs every 6 (six) hours as needed for wheezing or shortness of breath. Use with spacer  . aspirin 81 MG chewable tablet Chew 81 mg daily by mouth.  Marland Kitchen atorvastatin (LIPITOR) 40 MG tablet TAKE 1 TABLET BY MOUTH EVERY DAY  . azelastine (ASTELIN) 0.1 % nasal spray Place 2 sprays into both nostrils 2 (two) times daily.  Marland Kitchen BREO ELLIPTA 100-25 MCG/INH AEPB INHALE 1 PUFF INTO THE LUNGS DAILY  . clonazePAM (KLONOPIN) 0.5 MG tablet TK 1 T PO DAILY PRN  . COMBIVENT RESPIMAT 20-100 MCG/ACT AERS respimat INHALE 1 PUFF BY MOUTH EVERY 6 HOURS  . FLUoxetine (PROZAC) 20 MG tablet Take 60 mg by mouth daily.   . furosemide (LASIX) 20 MG tablet Take 1 tablet (20 mg total) by mouth daily.  Marland Kitchen gabapentin (NEURONTIN) 300 MG capsule Take 2 capsules (600 mg total) by mouth 3  (three) times daily.  Marland Kitchen loratadine (CLARITIN) 10 MG tablet Take 1 tablet (10 mg total) by mouth daily. (Patient not taking: Reported on 07/09/2019)  . Melatonin 3 MG CAPS Take 10 mg by mouth at bedtime as needed (sleep).   . metoprolol succinate (TOPROL-XL) 25 MG 24 hr tablet TAKE 1/2 TABLET(12.5 MG) BY MOUTH DAILY  . montelukast (SINGULAIR) 10 MG tablet TAKE 1 TABLET(10 MG) BY MOUTH AT BEDTIME  . SEROQUEL 100 MG tablet Take 100 mg by mouth at bedtime.   Facility-Administered Encounter Medications as of 07/22/2019  Medication  . albuterol (PROVENTIL) (2.5 MG/3ML) 0.083% nebulizer solution 2.5 mg     Objective:  BP Readings from Last 3 Encounters:  07/09/19 (!) 119/52  07/07/19 129/60  05/17/19 (!) 100/56    Goals Addressed            This Visit's Progress   . RNCM: I have a lot of health issues going on       [place] (see longtitudinal plan of care for additional care plan information)  Current Barriers:  . Chronic Disease Management support, education, and care coordination needs related to HTN, COPD, Depression, and Bilateral leg swelling  . Financial barriers . Mobility barriers   Clinical Goal(s) related to HTN, COPD, Depression, and bilateral leg swelling :  Over the next 120 days, patient will:  . Work with the care management team to address educational, disease management, and care coordination needs  . Begin or continue  self health monitoring activities as directed today Measure and record blood pressure 5 times per week, Measure and record weight daily, and wear compression stockings to help with bilateral leg swelling  . Call provider office for new or worsened signs and symptoms Blood pressure findings outside established parameters, Weight outside established parameters, Shortness of breath, and New or worsened symptom related to bilateral leg swelling or depression . Call care management team with questions or concerns . Verbalize basic understanding of  patient centered plan of care established today  Interventions related to HTN, COPD, Depression, and Bilateral lower leg swelling :  . Evaluation of current treatment plans and patient's adherence to plan as established by provider.  The patient is currently seeing cardiology and having testing done to determine if her heart is the reason for the swelling she is having and the shortness of breath. The patient recently had a stress test and and ECHO and her EF% is 60-65% . Assessed patient understanding of disease states.  The patient has a new concern of her left elbow hurting. The pain MD did xrays and says that he may have to draw fluid off of the elbow and if that does not work she may need to see a Psychologist, sport and exercise. The patient verbalized "it is all messed up".  The patient also complained of right hip pain. Had a fall a couple weeks ago but does not feel this is the reason for the pain. Denies injuries from the fall. Is taking medications as prescribed and also using heat and cold application.  The patient verbalized this is helping. The patient advised to let the pcp or RNCM know if there are changes that warrant the pcp evaluation. Completed- no new concerns at this time.  . Assessed patient's education and care coordination needs.  The patient verbalized she is using compression hose and that is helping with the swelling. The patient says she still has some but not as much. The patient advised to elevate her legs when sitting down. The patient admits she does not always do this but will try to start remembering to do this. 07-22-2019: Evaluation of leg swelling. The patient states that she is still having issues with this. Does not wear the compression stockings much during the summer, states: "It is too hot". Education given to wear when possible to help with circulation and swelling. The patient also reminded to elevate her legs when sitting.  Review of getting appointment rescheduled with the cardiologist and  the patient has not done this yet. Review of the benefits of seeing cardiologist is support and management of edema and other heart conditions. The patient verbalized understanding.  . Provided the patient with contact information for the CCM team.  . Assessed for urgent cardiology appointment: The patient has completed an appointment with [facility] the patient to have an ECHO cardiogram, was started on Lasix- states she lost 8 pounds.  07-22-2019: The patient has not rescheduled her cardiology appointment. Encouraged the patient to reschedule her appointment with cardiologist for evaluation and further treatment options.  . Assessed for device needs. The patient does not have a blood pressure cuff or a scale to monitor blood pressure or weights. Review of resources available. Completed- the patient now has a blood pressure cuff and scale and is using daily. Today's weight is 157.6 and her blood pressure with pulse is 100/56.  Education on orthostatic hypotension and to change position slowly. 07-22-2019: The patient is checking her blood pressure regularly. States  today was 131/69. Weight is stable.  . Provided disease specific education to patient: review of wearing of compression stockings (currently the patient is using compression stockings on bilateral legs.  Assessment reveals that the patient does not have any weeping from the legs or sores), elevation of feet and legs when sitting, the benefit of using blood pressure cuff to take daily readings- the patient is now checking blood pressure daily and recording- Today's is 100/56, and the benefit of using a scale to record daily weights- patient is doing daily weights and today is 157.6. Review of notification to the pcp/card for weight gain or loss +/- 3 pounds in one day or +/- 5 pounds in one week. The patient verbalized understanding. 07-22-2019: Review and support given to the patient.  Nash Dimmer with appropriate clinical care team  members regarding patient needs: Pharmacy referral in place, LCSW referral in place. The CCM team to assist with meeting the patients health and wellness needs and goals by working with the pcp and interdisciplinary team. . Evaluation of pulmonary status. The patient states that she has an appointment with the pulmonary provider on 08-17-2019.  Encouraged the patient to keep this appointment. The patient states she is doing well since seeing pcp and denies any new concerns.  . Review of appointment for 07-27-2019- the patient thought she had an appointment with the pcp, reviewed and the patient has AWV appointment scheduled via phone. Education and support given.   Patient Self Care Activities related to HTN, COPD, Depression, and bilateral leg swelling :  . Patient is unable to independently self-manage chronic health conditions  Please see past updates related to this goal by clicking on the "Past Updates" button in the selected goal          Plan:   The care management team will reach out to the patient again over the next 60 to 90 days.    [name] RN, MSN, [place] Mobile: [phone]

## 2019-07-22 NOTE — Patient Instructions (Signed)
Visit Information  Goals Addressed            This Visit's Progress    RNCM: I have a lot of health issues going on       [place] (see longtitudinal plan of care for additional care plan information)  Current Barriers:   Chronic Disease Management support, education, and care coordination needs related to HTN, COPD, Depression, and Bilateral leg swelling   Financial barriers  Mobility barriers   Clinical Goal(s) related to HTN, COPD, Depression, and bilateral leg swelling :  Over the next 120 days, patient will:   Work with the care management team to address educational, disease management, and care coordination needs   Begin or continue self health monitoring activities as directed today Measure and record blood pressure 5 times per week, Measure and record weight daily, and wear compression stockings to help with bilateral leg swelling   Call provider office for new or worsened signs and symptoms Blood pressure findings outside established parameters, Weight outside established parameters, Shortness of breath, and New or worsened symptom related to bilateral leg swelling or depression  Call care management team with questions or concerns  Verbalize basic understanding of patient centered plan of care established today  Interventions related to HTN, COPD, Depression, and Bilateral lower leg swelling :   Evaluation of current treatment plans and patient's adherence to plan as established by provider.  The patient is currently seeing cardiology and having testing done to determine if her heart is the reason for the swelling she is having and the shortness of breath. The patient recently had a stress test and and ECHO and her EF% is 60-65%  Assessed patient understanding of disease states.  The patient has a new concern of her left elbow hurting. The pain MD did xrays and says that he may have to draw fluid off of the elbow and if that does not work she may need to see a  Psychologist, sport and exercise. The patient verbalized "it is all messed up".  The patient also complained of right hip pain. Had a fall a couple weeks ago but does not feel this is the reason for the pain. Denies injuries from the fall. Is taking medications as prescribed and also using heat and cold application.  The patient verbalized this is helping. The patient advised to let the pcp or RNCM know if there are changes that warrant the pcp evaluation. Completed- no new concerns at this time.   Assessed patient's education and care coordination needs.  The patient verbalized she is using compression hose and that is helping with the swelling. The patient says she still has some but not as much. The patient advised to elevate her legs when sitting down. The patient admits she does not always do this but will try to start remembering to do this. 07-22-2019: Evaluation of leg swelling. The patient states that she is still having issues with this. Does not wear the compression stockings much during the summer, states: "It is too hot". Education given to wear when possible to help with circulation and swelling. The patient also reminded to elevate her legs when sitting.  Review of getting appointment rescheduled with the cardiologist and the patient has not done this yet. Review of the benefits of seeing cardiologist is support and management of edema and other heart conditions. The patient verbalized understanding.   Provided the patient with contact information for the CCM team.   Assessed for urgent cardiology appointment: The patient  has completed an appointment with [facility] the patient to have an ECHO cardiogram, was started on Lasix- states she lost 8 pounds.  07-22-2019: The patient has not rescheduled her cardiology appointment. Encouraged the patient to reschedule her appointment with cardiologist for evaluation and further treatment options.   Assessed for device needs. The patient does not have a blood  pressure cuff or a scale to monitor blood pressure or weights. Review of resources available. Completed- the patient now has a blood pressure cuff and scale and is using daily. Today's weight is 157.6 and her blood pressure with pulse is 100/56.  Education on orthostatic hypotension and to change position slowly. 07-22-2019: The patient is checking her blood pressure regularly. States today was 131/69. Weight is stable.   Provided disease specific education to patient: review of wearing of compression stockings (currently the patient is using compression stockings on bilateral legs.  Assessment reveals that the patient does not have any weeping from the legs or sores), elevation of feet and legs when sitting, the benefit of using blood pressure cuff to take daily readings- the patient is now checking blood pressure daily and recording- Today's is 100/56, and the benefit of using a scale to record daily weights- patient is doing daily weights and today is 157.6. Review of notification to the pcp/card for weight gain or loss +/- 3 pounds in one day or +/- 5 pounds in one week. The patient verbalized understanding. 07-22-2019: Review and support given to the patient.   Collaborated with appropriate clinical care team members regarding patient needs: Pharmacy referral in place, LCSW referral in place. The CCM team to assist with meeting the patients health and wellness needs and goals by working with the pcp and interdisciplinary team.  Evaluation of pulmonary status. The patient states that she has an appointment with the pulmonary provider on 08-17-2019.  Encouraged the patient to keep this appointment. The patient states she is doing well since seeing pcp and denies any new concerns.   Review of appointment for 07-27-2019- the patient thought she had an appointment with the pcp, reviewed and the patient has AWV appointment scheduled via phone. Education and support given.   Patient Self Care Activities related  to HTN, COPD, Depression, and bilateral leg swelling :   Patient is unable to independently self-manage chronic health conditions  Please see past updates related to this goal by clicking on the "Past Updates" button in the selected goal         Patient verbalizes understanding of instructions provided today.   The care management team will reach out to the patient again over the next 60 to 90 days.   [name] RN, MSN, [place] Mobile: [phone]

## 2019-07-27 ENCOUNTER — Ambulatory Visit (INDEPENDENT_AMBULATORY_CARE_PROVIDER_SITE_OTHER): Payer: Medicare HMO

## 2019-07-27 VITALS — Ht 65.0 in | Wt 155.0 lb

## 2019-07-27 DIAGNOSIS — Z0000 Encounter for general adult medical examination without abnormal findings: Secondary | ICD-10-CM | POA: Diagnosis not present

## 2019-07-27 NOTE — Patient Instructions (Signed)
Ms. Irby , Thank you for taking time to come for your Medicare Wellness Visit. I appreciate your ongoing commitment to your health goals. Please review the following plan we discussed and let me know if I can assist you in the future.   Screening recommendations/referrals: Colonoscopy: completed 05/26/2013, due 05/27/2023 Mammogram: completed 04/2019, due 04/2020 Bone Density: completed 05/06/2012 Recommended yearly ophthalmology/optometry visit for glaucoma screening and checkup Recommended yearly dental visit for hygiene and checkup  Vaccinations: Influenza vaccine: completed 10/15/2018, due 08/29/2018 Pneumococcal vaccine: completed 09/27/2014 Tdap vaccine: completed 09/29/2015 Shingles vaccine: discussed   Covid-19:2/19/201, 04/09/2019  Advanced directives: Advance directive discussed with you today. Even though you declined this today please call our office should you change your mind and we can give you the proper paperwork for you to fill out.   Conditions/risks identified: none  Next appointment: Follow up in one year for your annual wellness visit    Preventive Care 65 Years and Older, Female Preventive care refers to lifestyle choices and visits with your health care provider that can promote health and wellness. What does preventive care include?  A yearly physical exam. This is also called an annual well check.  Dental exams once or twice a year.  Routine eye exams. Ask your health care provider how often you should have your eyes checked.  Personal lifestyle choices, including:  Daily care of your teeth and gums.  Regular physical activity.  Eating a healthy diet.  Avoiding tobacco and drug use.  Limiting alcohol use.  Practicing safe sex.  Taking low-dose aspirin every day.  Taking vitamin and mineral supplements as recommended by your health care provider. What happens during an annual well check? The services and screenings done by your health care  provider during your annual well check will depend on your age, overall health, lifestyle risk factors, and family history of disease. Counseling  Your health care provider may ask you questions about your:  Alcohol use.  Tobacco use.  Drug use.  Emotional well-being.  Home and relationship well-being.  Sexual activity.  Eating habits.  History of falls.  Memory and ability to understand (cognition).  Work and work Statistician.  Reproductive health. Screening  You may have the following tests or measurements:  Height, weight, and BMI.  Blood pressure.  Lipid and cholesterol levels. These may be checked every 5 years, or more frequently if you are over 61 years old.  Skin check.  Lung cancer screening. You may have this screening every year starting at age 54 if you have a 30-pack-year history of smoking and currently smoke or have quit within the past 15 years.  Fecal occult blood test (FOBT) of the stool. You may have this test every year starting at age 85.  Flexible sigmoidoscopy or colonoscopy. You may have a sigmoidoscopy every 5 years or a colonoscopy every 10 years starting at age 82.  Hepatitis C blood test.  Hepatitis B blood test.  Sexually transmitted disease (STD) testing.  Diabetes screening. This is done by checking your blood sugar (glucose) after you have not eaten for a while (fasting). You may have this done every 1-3 years.  Bone density scan. This is done to screen for osteoporosis. You may have this done starting at age 53.  Mammogram. This may be done every 1-2 years. Talk to your health care provider about how often you should have regular mammograms. Talk with your health care provider about your test results, treatment options, and if necessary, the need  for more tests. Vaccines  Your health care provider may recommend certain vaccines, such as:  Influenza vaccine. This is recommended every year.  Tetanus, diphtheria, and acellular  pertussis (Tdap, Td) vaccine. You may need a Td booster every 10 years.  Zoster vaccine. You may need this after age 13.  Pneumococcal 13-valent conjugate (PCV13) vaccine. One dose is recommended after age 73.  Pneumococcal polysaccharide (PPSV23) vaccine. One dose is recommended after age 22. Talk to your health care provider about which screenings and vaccines you need and how often you need them. This information is not intended to replace advice given to you by your health care provider. Make sure you discuss any questions you have with your health care provider. Document Released: 02/10/2015 Document Revised: 10/04/2015 Document Reviewed: 11/15/2014 Elsevier Interactive Patient Education  2017 [place] Prevention in the Home Falls can cause injuries. They can happen to people of all ages. There are many things you can do to make your home safe and to help prevent falls. What can I do on the outside of my home?  Regularly fix the edges of walkways and driveways and fix any cracks.  Remove anything that might make you trip as you walk through a door, such as a raised step or threshold.  Trim any bushes or trees on the path to your home.  Use bright outdoor lighting.  Clear any walking paths of anything that might make someone trip, such as rocks or tools.  Regularly check to see if handrails are loose or broken. Make sure that both sides of any steps have handrails.  Any raised decks and porches should have guardrails on the edges.  Have any leaves, snow, or ice cleared regularly.  Use sand or salt on walking paths during winter.  Clean up any spills in your garage right away. This includes oil or grease spills. What can I do in the bathroom?  Use night lights.  Install grab bars by the toilet and in the tub and shower. Do not use towel bars as grab bars.  Use non-skid mats or decals in the tub or shower.  If you need to sit down in the shower, use a plastic,  non-slip stool.  Keep the floor dry. Clean up any water that spills on the floor as soon as it happens.  Remove soap buildup in the tub or shower regularly.  Attach bath mats securely with double-sided non-slip rug tape.  Do not have throw rugs and other things on the floor that can make you trip. What can I do in the bedroom?  Use night lights.  Make sure that you have a light by your bed that is easy to reach.  Do not use any sheets or blankets that are too big for your bed. They should not hang down onto the floor.  Have a firm chair that has side arms. You can use this for support while you get dressed.  Do not have throw rugs and other things on the floor that can make you trip. What can I do in the kitchen?  Clean up any spills right away.  Avoid walking on wet floors.  Keep items that you use a lot in easy-to-reach places.  If you need to reach something above you, use a strong step stool that has a grab bar.  Keep electrical cords out of the way.  Do not use floor polish or wax that makes floors slippery. If you must use wax, use  non-skid floor wax.  Do not have throw rugs and other things on the floor that can make you trip. What can I do with my stairs?  Do not leave any items on the stairs.  Make sure that there are handrails on both sides of the stairs and use them. Fix handrails that are broken or loose. Make sure that handrails are as long as the stairways.  Check any carpeting to make sure that it is firmly attached to the stairs. Fix any carpet that is loose or worn.  Avoid having throw rugs at the top or bottom of the stairs. If you do have throw rugs, attach them to the floor with carpet tape.  Make sure that you have a light switch at the top of the stairs and the bottom of the stairs. If you do not have them, ask someone to add them for you. What else can I do to help prevent falls?  Wear shoes that:  Do not have high heels.  Have rubber  bottoms.  Are comfortable and fit you well.  Are closed at the toe. Do not wear sandals.  If you use a stepladder:  Make sure that it is fully opened. Do not climb a closed stepladder.  Make sure that both sides of the stepladder are locked into place.  Ask someone to hold it for you, if possible.  Clearly mark and make sure that you can see:  Any grab bars or handrails.  First and last steps.  Where the edge of each step is.  Use tools that help you move around (mobility aids) if they are needed. These include:  Canes.  Walkers.  Scooters.  Crutches.  Turn on the lights when you go into a dark area. Replace any light bulbs as soon as they burn out.  Set up your furniture so you have a clear path. Avoid moving your furniture around.  If any of your floors are uneven, fix them.  If there are any pets around you, be aware of where they are.  Review your medicines with your doctor. Some medicines can make you feel dizzy. This can increase your chance of falling. Ask your doctor what other things that you can do to help prevent falls. This information is not intended to replace advice given to you by your health care provider. Make sure you discuss any questions you have with your health care provider. Document Released: 11/10/2008 Document Revised: 06/22/2015 Document Reviewed: 02/18/2014 Elsevier Interactive Patient Education  2017 [employer].

## 2019-07-27 NOTE — Progress Notes (Signed)
I connected with [name] today by telephone and verified that I am speaking with the correct person using two identifiers. Location patient: home Location provider: work Persons participating in the virtual visit: [name], Glenna Durand LPN.   I discussed the limitations, risks, security and privacy concerns of performing an evaluation and management service by telephone and the availability of in person appointments. I also discussed with the patient that there may be a patient responsible charge related to this service. The patient expressed understanding and verbally consented to this telephonic visit.    Interactive audio and video telecommunications were attempted between this provider and patient, however failed, due to patient having technical difficulties OR patient did not have access to video capability.  We continued and completed visit with audio only.    Vital signs may be patient reported or missing.   Subjective:   Beth [name] is a 74 y.o. female who presents for Medicare Annual (Subsequent) preventive examination.  Review of Systems     Cardiac Risk Factors include: advanced age (>42men, >66 women);sedentary lifestyle     Objective:    Today's Vitals   07/27/19 1140 07/27/19 1141  Weight: 155 lb (70.3 kg)   Height: 5\' 5"  (1.651 m)   PainSc:  8    Body mass index is 25.79 kg/m.  Advanced Directives 07/27/2019 05/10/2019 09/23/2018 07/29/2018 04/07/2018 03/09/2018 02/12/2018  Does Patient Have a Medical Advance Directive? No No No No No No No  Would patient like information on creating a medical advance directive? - - - No - Patient declined No - Patient declined No - Patient declined No - Patient declined  Some encounter information is confidential and restricted. Go to Review Flowsheets activity to see all data.    Current Medications (verified) Outpatient Encounter Medications as of 07/27/2019  Medication Sig  . acetaminophen (TYLENOL) 500 MG  tablet Take 500 mg by mouth 2 (two) times daily as needed.   Marland Kitchen albuterol (VENTOLIN HFA) 108 (90 Base) MCG/ACT inhaler Inhale 1-2 puffs into the lungs every 6 (six) hours as needed for wheezing or shortness of breath. Use with spacer  . aspirin 81 MG chewable tablet Chew 81 mg daily by mouth.  Marland Kitchen atorvastatin (LIPITOR) 40 MG tablet TAKE 1 TABLET BY MOUTH EVERY DAY  . azelastine (ASTELIN) 0.1 % nasal spray Place 2 sprays into both nostrils 2 (two) times daily.  Marland Kitchen BREO ELLIPTA 100-25 MCG/INH AEPB INHALE 1 PUFF INTO THE LUNGS DAILY  . clonazePAM (KLONOPIN) 0.5 MG tablet TK 1 T PO DAILY PRN  . COMBIVENT RESPIMAT 20-100 MCG/ACT AERS respimat INHALE 1 PUFF BY MOUTH EVERY 6 HOURS  . FLUoxetine (PROZAC) 20 MG tablet Take 60 mg by mouth daily.   Marland Kitchen gabapentin (NEURONTIN) 300 MG capsule Take 2 capsules (600 mg total) by mouth 3 (three) times daily.  . Melatonin 3 MG CAPS Take 10 mg by mouth at bedtime as needed (sleep).   . metoprolol succinate (TOPROL-XL) 25 MG 24 hr tablet TAKE 1/2 TABLET(12.5 MG) BY MOUTH DAILY  . montelukast (SINGULAIR) 10 MG tablet TAKE 1 TABLET(10 MG) BY MOUTH AT BEDTIME  . SEROQUEL 100 MG tablet Take 100 mg by mouth at bedtime.  . furosemide (LASIX) 20 MG tablet Take 1 tablet (20 mg total) by mouth daily.  Marland Kitchen loratadine (CLARITIN) 10 MG tablet Take 1 tablet (10 mg total) by mouth daily. (Patient not taking: Reported on 07/09/2019)   Facility-Administered Encounter Medications as of 07/27/2019  Medication  . albuterol (  PROVENTIL) (2.5 MG/3ML) 0.083% nebulizer solution 2.5 mg    Allergies (verified) Bextra  [valdecoxib], Compazine [prochlorperazine edisylate], Lithium carbonate, and Lyrica [pregabalin]   History: Past Medical History:  Diagnosis Date  . Allergy   . Anxiety   . Asthma   . Chronic pain syndrome    discharged from pain clinic, hx of narcotics seeking behavior  . COPD (chronic obstructive pulmonary disease) ([place])   . CVA (cerebral infarction)   . Depression   .  Fibromyalgia   . Headache   . Hyperlipidemia   . Hypertension   . IBS (irritable bowel syndrome)   . Stroke ([place])   . Vitamin D deficiency    Past Surgical History:  Procedure Laterality Date  . ABDOMINAL HYSTERECTOMY    . APPENDECTOMY    . BREAST EXCISIONAL BIOPSY  2011   Pt states lump removed, ? side, no scar seen  . BREAST SURGERY  2011   biopsy  . CERVICAL DISCECTOMY    . CHOLECYSTECTOMY    . SINUSOTOMY     Family History  Problem Relation Age of Onset  . Anxiety disorder Mother   . Depression Mother   . Breast cancer Mother 74  . Cancer Father   . Gallbladder disease Father   . Alcohol abuse Father   . Depression Father    Social History   Socioeconomic History  . Marital status: Legally Separated    Spouse name: Not on file  . Number of children: 2  . Years of education: Not on file  . Highest education level: Not on file  Occupational History  . Occupation: retired  Tobacco Use  . Smoking status: Light Tobacco Smoker    Packs/day: 0.25    Years: 35.00    Pack years: 8.75    Types: Cigarettes    Last attempt to quit: 12/28/2018    Years since quitting: 0.5  . Smokeless tobacco: Never Used  Vaping Use  . Vaping Use: Never used  Substance and Sexual Activity  . Alcohol use: No    Alcohol/week: 0.0 standard drinks  . Drug use: No  . Sexual activity: Never  Other Topics Concern  . Not on file  Social History Narrative  . Not on file   Social Determinants of Health   Financial Resource Strain: Low Risk   . Difficulty of Paying Living Expenses: Not hard at all  Food Insecurity: No Food Insecurity  . Worried About Charity fundraiser in the Last Year: Never true  . Ran Out of Food in the Last Year: Never true  Transportation Needs: No Transportation Needs  . Lack of Transportation (Medical): No  . Lack of Transportation (Non-Medical): No  Physical Activity: Insufficiently Active  . Days of Exercise per Week: 7 days  . Minutes of Exercise per  Session: 10 min  Stress: Stress Concern Present  . Feeling of Stress : Very much  Social Connections:   . Frequency of Communication with Friends and Family:   . Frequency of Social Gatherings with Friends and Family:   . Attends Religious Services:   . Active Member of Clubs or Organizations:   . Attends Archivist Meetings:   Marland Kitchen Marital Status:     Tobacco Counseling Ready to quit: Not Answered Counseling given: Not Answered   Clinical Intake:  Pre-visit preparation completed: Yes  Pain : 0-10 Pain Score: 8  Pain Type: Chronic pain Pain Location: Other (Comment) (spine and hip) Pain Orientation: Right Pain Radiating Towards:  goes up hip Pain Descriptors / Indicators: Shooting Pain Onset: More than a month ago Pain Frequency: Constant Pain Relieving Factors: nothing helps  Pain Relieving Factors: nothing helps  Nutritional Status: BMI 25 -29 Overweight Nutritional Risks: None Diabetes: No  How often do you need to have someone help you when you read instructions, pamphlets, or other written materials from your doctor or pharmacy?: 1 - Never What is the last grade level you completed in 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 no  Interpreter Needed?: No  Information entered by :: NAllen LPN   Activities of Daily Living In your present state of health, do you have any difficulty performing the following activities: 07/27/2019 03/22/2019  Hearing? N N  Vision? N Y  Difficulty concentrating or making decisions? N N  Walking or climbing stairs? N Y  Dressing or bathing? N N  Doing errands, shopping? N N  Preparing Food and eating ? N -  Using the Toilet? N -  In the past six months, have you accidently leaked urine? Y -  Comment when water is run -  Do you have problems with loss of bowel control? N -  Managing your Medications? N -  Managing your Finances? N -  Housekeeping or managing your Housekeeping? N -  Some recent data might be hidden     Patient Care Team: Malfi, Lupita Raider, FNP as PCP - General (Family Medicine) Kate Sable, MD as PCP - Cardiology (Cardiology) [name], MD as Consulting Physician (Pain Medicine) Randel Pigg, MD as Referring Physician (Psychiatry) Dhalla, Virl Diamond, [facility] (Pharmacist) Vanita Ingles, RN as Case Manager ([place]) Greg Cutter, LCSW as Social Worker (Licensed Clinical Social Worker)  Indicate any recent [employer] you may have received from other than [facility] providers in the past year (date may be approximate).     Assessment:   This is a routine wellness examination for [facility]  Hearing/Vision screen  Hearing Screening   125Hz  250Hz  500Hz  1000Hz  2000Hz  3000Hz  4000Hz  6000Hz  8000Hz   Right ear:           Left ear:           Vision Screening Comments: No regular eye exams. [facility]  Dietary issues and exercise activities discussed: Current Exercise Habits: Home exercise routine, Type of exercise: walking, Time (Minutes): 15, Frequency (Times/Week): 7, Weekly Exercise (Minutes/Week): 105  Goals    .  Patient Stated      07/27/2019, no goals    .  PharmD - Medication Management      CARE PLAN ENTRY (see longtitudinal plan of care for additional care plan information)   Current Barriers:  . Chronic Disease Management support, education, and care coordination needs related to previous CVA, COPD, hypertension, hyperlipidemia, depression  Pharmacist Clinical Goal(s):  Marland Kitchen Over the next 30 days, patient will work with CM Pharmacist to complete medication review address needs identified  Interventions: . Perform chart review. Note patient seen by PCP on 6/11 . Counsel on importance of blood pressure monitoring and control ? Reports taking: ? Metoprolol ER 25 mg - 1/2 tablet daily ? Furosemide 20 mg - 1 tablet daily ? Note patient has home upper BP monitor ? Reports monitoring home BP as directed ? Reports last checked yesterday, recalls BP  ~130/70s, but did not record as ran out of space on BP log ? Will mail patient new BP log as requested . Encourage patient to continue to monitor, keep log of BP  and daily weights and bring record to medical appointments ? Remind patient to call provider for readings outside of established parameters or for new or worsening symptoms . Counsel on importance of medication adherence.  ? Again encourage patient to obtain a weekly pillbox ? Patient confirms taking course of Macrobid 100 mg twice daily as prescribed by PCP, started on 6/15 ? Counsel patient on importance of completing 5 day course . Coordination of care ? Confirm patient scheduled appointment with pulmonology as referred by PCP ? Encourage patient to reschedule missed appointment with Cardiology. Provide patient with phone number  Patient Self Care Activities:  . To self administers medications as prescribed o Encourage patient to obtain weekly pillbox to use as adherence tool . Attends all scheduled provider appointments o Appointment with Pulmonology on 7/20 . Calls pharmacy for medication refills . Calls provider office for new concerns or questions  Please see past updates related to this goal by clicking on the "Past Updates" button in the selected goal      .  RNCM: I have a lot of health issues going on      [place] (see longtitudinal plan of care for additional care plan information)  Current Barriers:  . Chronic Disease Management support, education, and care coordination needs related to HTN, COPD, Depression, and Bilateral leg swelling  . Financial barriers . Mobility barriers   Clinical Goal(s) related to HTN, COPD, Depression, and bilateral leg swelling :  Over the next 120 days, patient will:  . Work with the care management team to address educational, disease management, and care coordination needs  . Begin or continue self health monitoring activities as directed today Measure and record blood  pressure 5 times per week, Measure and record weight daily, and wear compression stockings to help with bilateral leg swelling  . Call provider office for new or worsened signs and symptoms Blood pressure findings outside established parameters, Weight outside established parameters, Shortness of breath, and New or worsened symptom related to bilateral leg swelling or depression . Call care management team with questions or concerns . Verbalize basic understanding of patient centered plan of care established today  Interventions related to HTN, COPD, Depression, and Bilateral lower leg swelling :  . Evaluation of current treatment plans and patient's adherence to plan as established by provider.  The patient is currently seeing cardiology and having testing done to determine if her heart is the reason for the swelling she is having and the shortness of breath. The patient recently had a stress test and and ECHO and her EF% is 60-65% . Assessed patient understanding of disease states.  The patient has a new concern of her left elbow hurting. The pain MD did xrays and says that he may have to draw fluid off of the elbow and if that does not work she may need to see a Psychologist, sport and exercise. The patient verbalized "it is all messed up".  The patient also complained of right hip pain. Had a fall a couple weeks ago but does not feel this is the reason for the pain. Denies injuries from the fall. Is taking medications as prescribed and also using heat and cold application.  The patient verbalized this is helping. The patient advised to let the pcp or RNCM know if there are changes that warrant the pcp evaluation. Completed- no new concerns at this time.  . Assessed patient's education and care coordination needs.  The patient verbalized she is using compression hose and  that is helping with the swelling. The patient says she still has some but not as much. The patient advised to elevate her legs when sitting down. The patient  admits she does not always do this but will try to start remembering to do this. 07-22-2019: Evaluation of leg swelling. The patient states that she is still having issues with this. Does not wear the compression stockings much during the summer, states: "It is too hot". Education given to wear when possible to help with circulation and swelling. The patient also reminded to elevate her legs when sitting.  Review of getting appointment rescheduled with the cardiologist and the patient has not done this yet. Review of the benefits of seeing cardiologist is support and management of edema and other heart conditions. The patient verbalized understanding.  . Provided the patient with contact information for the CCM team.  . Assessed for urgent cardiology appointment: The patient has completed an appointment with [facility] the patient to have an ECHO cardiogram, was started on Lasix- states she lost 8 pounds.  07-22-2019: The patient has not rescheduled her cardiology appointment. Encouraged the patient to reschedule her appointment with cardiologist for evaluation and further treatment options.  . Assessed for device needs. The patient does not have a blood pressure cuff or a scale to monitor blood pressure or weights. Review of resources available. Completed- the patient now has a blood pressure cuff and scale and is using daily. Today's weight is 157.6 and her blood pressure with pulse is 100/56.  Education on orthostatic hypotension and to change position slowly. 07-22-2019: The patient is checking her blood pressure regularly. States today was 131/69. Weight is stable.  . Provided disease specific education to patient: review of wearing of compression stockings (currently the patient is using compression stockings on bilateral legs.  Assessment reveals that the patient does not have any weeping from the legs or sores), elevation of feet and legs when sitting, the benefit of using blood pressure cuff to  take daily readings- the patient is now checking blood pressure daily and recording- Today's is 100/56, and the benefit of using a scale to record daily weights- patient is doing daily weights and today is 157.6. Review of notification to the pcp/card for weight gain or loss +/- 3 pounds in one day or +/- 5 pounds in one week. The patient verbalized understanding. 07-22-2019: Review and support given to the patient.  Nash Dimmer with appropriate clinical care team members regarding patient needs: Pharmacy referral in place, LCSW referral in place. The CCM team to assist with meeting the patients health and wellness needs and goals by working with the pcp and interdisciplinary team. . Evaluation of pulmonary status. The patient states that she has an appointment with the pulmonary provider on 08-17-2019.  Encouraged the patient to keep this appointment. The patient states she is doing well since seeing pcp and denies any new concerns.  . Review of appointment for 07-27-2019- the patient thought she had an appointment with the pcp, reviewed and the patient has AWV appointment scheduled via phone. Education and support given.   Patient Self Care Activities related to HTN, COPD, Depression, and bilateral leg swelling :  . Patient is unable to independently self-manage chronic health conditions  Please see past updates related to this goal by clicking on the "Past Updates" button in the selected goal      .  SW: I have a lot of stress right now (pt-stated)  Current Barriers:  . Chronic Mental Health needs related to depression . Financial constraints related to managing health care expenses . Limited social support . ADL IADL limitations . Limited access to caregiver . Inability to perform ADL's independently . Suicidal Ideation/Homicidal Ideation: No  Clinical Social Work Goal(s):  Marland Kitchen Over the next 120 days, patient will work with SW  bi-monthly  by telephone or in person to reduce or manage  symptoms related to depression . Over the next 120 days, patient will work with SW to address concerns related to decreasing depression and increasing self-care  Interventions: . Patient interviewed and appropriate assessments performed: brief mental health assessment . Provided mental health counseling with regard to depression. Patient reports feeling " a little better" She shares that she is trying to manage her depression as best as she can. LCSW educated patient on coping methods to implement into her daily life to combat depressive symptoms and stress. Patient denied any suicidal or homicidal ideations. Encouraged patient to implement deep breathing exercises into her daily routine due to ongoing stress. Patient reports that she is actively working on both her physical and mental health. She shares that her legs continue to swell even though she is now on Lasix. Patient reports that she is using her compression stockings and elevating her legs as needed.  . Discussed plans with patient for ongoing care management follow up and provided patient with direct contact information for care management team . Provided education and assistance to client regarding Advanced Directives. . A voluntary and extensive discussion about advanced care planning including explanation and discussion of advanced was undertaken with the patient. Explanation regarding healthcare proxy and living will was reviewed and packet with forms with explanation of how to fill them out was given.   . Provided education to patient/caregiver about Hospice and/or Palliative Care services . Mindfulness or Relaxation Training provided during session for patient to implement when faced with future triggers . Patient reports implementing appropriate self-care by getting up each day and walking outside to get some fresh air. LCSW provided positive reinforcement for this accomplishment. . Patient denies any issues with her sleep routine. She  reports that she takes a melatonin vitamin every night which helps her to unwind from the day.    Patient Self Care Activities:  . Calls provider office for new concerns or questions . Ability for insight . Motivation for treatment  Patient Coping Strengths:  . Hopefulness . Self Advocate . Able to Communicate Effectively  Patient Self Care Deficits:  . Lacks social connections  Please see past updates related to this goal by clicking on the "Past Updates" button in the selected goal        Depression Screen PHQ 2/9 Scores 07/27/2019 07/09/2019 03/23/2019 03/22/2019 09/23/2018 07/29/2018 07/14/2018  PHQ - 2 Score 0 2 1 1  0 2 2  PHQ- 9 Score - 3 - - - 13 15  Exception Documentation - - - - - - -    Fall Risk Fall Risk  07/27/2019 07/07/2019 05/10/2019 09/23/2018 07/29/2018  Falls in the past year? 1 1 0 0 0  Number falls in past yr: 1 - - - -  Injury with Fall? 0 1 - - -  Comment - Bruise both legs - - -  Risk for fall due to : History of fall(s);Medication side effect - - - -  Follow up Falls evaluation completed;Education provided;Falls prevention discussed - - - -    Any stairs in or around  the home? No  If so, are there any without handrails? n/a Home free of loose throw rugs in walkways, pet beds, electrical cords, etc? Yes  Adequate lighting in your home to reduce risk of falls? Yes   ASSISTIVE DEVICES UTILIZED TO PREVENT FALLS:  Life alert? No  Use of a cane, walker or w/c? No  Grab bars in the bathroom? No  Shower chair or bench in shower? No  Elevated toilet seat or a handicapped toilet? No   TIMED UP AND GO:  Was the test performed? No .  .     Cognitive Function:     6CIT Screen 07/27/2019  What Year? 0 points  What month? 0 points  What time? 0 points  Count back from 20 0 points  Months in reverse 2 points  Repeat phrase 0 points  Total Score 2    Immunizations Immunization History  Administered Date(s) Administered  . Influenza Split 11/13/2006,  11/02/2008  . Influenza, High Dose Seasonal PF 09/27/2014, 09/29/2015, 12/30/2016  . Influenza, Seasonal, Injecte, Preservative Fre 11/12/2010  . Influenza,inj,Quad PF,6+ Mos 10/08/2012  . Influenza-Unspecified 04/08/2014  . PPD Test 05/16/2016  . Pneumococcal Conjugate-13 09/27/2014  . Pneumococcal Polysaccharide-23 06/12/2010  . Tdap 03/19/2007, 09/29/2015  . Zoster 04/16/2012    TDAP status: Up to date Flu Vaccine status: Up to date Pneumococcal vaccine status: Up to date Covid-19 vaccine status: Completed vaccines  Qualifies for Shingles Vaccine? Yes   Zostavax completed Yes   Shingrix Completed?: No.    Education has been provided regarding the importance of this vaccine. Patient has been advised to call insurance company to determine out of pocket expense if they have not yet received this vaccine. Advised may also receive vaccine at local pharmacy or Health Dept. Verbalized acceptance and understanding.  Screening Tests Health Maintenance  Topic Date Due  . COVID-19 Vaccine (1) Never done  . INFLUENZA VACCINE  08/29/2019  . COLONOSCOPY  05/27/2023  . TETANUS/TDAP  09/28/2025  . DEXA SCAN  Completed  . Hepatitis C Screening  Completed  . PNA vac Low Risk Adult  Completed    Health Maintenance  Health Maintenance Due  Topic Date Due  . COVID-19 Vaccine (1) Never done    Colorectal cancer screening: Completed 05/26/2013. Repeat every 10 years Mammogram status: Completed 04/2019. Repeat every year Bone Density status: Completed 05/06/2012 .  Lung Cancer Screening: (Low Dose CT Chest recommended if Age 35-80 years, 30 pack-year currently smoking OR have quit w/in 15years.) does not qualify.   Lung Cancer Screening Referral: no  Additional Screening:  Hepatitis C Screening: does qualify; Completed 04/27/2012  Vision Screening: Recommended annual ophthalmology exams for early detection of glaucoma and other disorders of the eye. Is the patient up to date with their  annual eye exam?  No  Who is the provider or what is the name of the office in which the patient attends annual eye exams? [facility] If pt is not established with a provider, would they like to be referred to a provider to establish care? No .   Dental Screening: Recommended annual dental exams for proper oral hygiene  Community Resource Referral / Chronic Care Management: CRR required this visit?  No   CCM required this visit?  No      Plan:     I have personally reviewed and noted the following in the patient's chart:   . Medical and social history . Use of alcohol, tobacco or illicit drugs  .  Current medications and supplements . Functional ability and status . Nutritional status . Physical activity . Advanced directives . List of other physicians . Hospitalizations, surgeries, and ER visits in previous 12 months . Vitals . Screenings to include cognitive, depression, and falls . Referrals and appointments  In addition, I have reviewed and discussed with patient certain preventive protocols, quality metrics, and best practice recommendations. A written personalized care plan for preventive services as well as general preventive health recommendations were provided to patient.     Kellie Simmering, LPN   [id number]   Nurse Notes:

## 2019-07-28 ENCOUNTER — Encounter: Payer: Self-pay | Admitting: Student in an Organized Health Care Education/Training Program

## 2019-07-28 NOTE — Progress Notes (Signed)
Patient reports that she has fallen a couple of times, the last being approx 1 month ago.  Thinks she may have fx'd her tailbone. States this is causing her a lot of pain when she walks,  States when she walks and steps down on the right leg, it causes pain in her right leg.  States when she yawns at night it creates the pain in the middle of her tailbone.

## 2019-07-29 ENCOUNTER — Other Ambulatory Visit: Payer: Self-pay

## 2019-07-29 ENCOUNTER — Ambulatory Visit
Payer: Medicare HMO | Attending: Student in an Organized Health Care Education/Training Program | Admitting: Student in an Organized Health Care Education/Training Program

## 2019-07-29 ENCOUNTER — Encounter: Payer: Self-pay | Admitting: Student in an Organized Health Care Education/Training Program

## 2019-07-29 DIAGNOSIS — M533 Sacrococcygeal disorders, not elsewhere classified: Secondary | ICD-10-CM | POA: Insufficient documentation

## 2019-07-29 DIAGNOSIS — M25552 Pain in left hip: Secondary | ICD-10-CM

## 2019-07-29 DIAGNOSIS — G894 Chronic pain syndrome: Secondary | ICD-10-CM

## 2019-07-29 DIAGNOSIS — M25551 Pain in right hip: Secondary | ICD-10-CM

## 2019-07-29 DIAGNOSIS — M5416 Radiculopathy, lumbar region: Secondary | ICD-10-CM | POA: Diagnosis not present

## 2019-07-29 DIAGNOSIS — M47816 Spondylosis without myelopathy or radiculopathy, lumbar region: Secondary | ICD-10-CM | POA: Diagnosis not present

## 2019-07-29 MED ORDER — METHYLPREDNISOLONE 4 MG PO TBPK: ORAL_TABLET | ORAL | 0 refills | Status: AC

## 2019-07-29 NOTE — Progress Notes (Signed)
Patient: Beth [name]  Service Category: E/M  Provider: [name], MD  DOB: 1946-01-15  DOS: 07/29/2019  Location: Office  MRN: [id number]  Setting: Ambulatory outpatient  Referring Provider: Verl Bangs, FNP  Type: Established Patient  Specialty: Interventional Pain Management  PCP: Beth Bangs, FNP  Location: Home  Delivery: TeleHealth     Virtual Encounter - Pain Management PROVIDER NOTE: Information contained herein reflects review and annotations entered in association with encounter. Interpretation of such information and data should be left to medically-trained personnel. Information provided to patient can be located elsewhere in the medical record under "Patient Instructions". Document created using STT-dictation technology, any transcriptional errors that may result from process are unintentional.    Contact & Pharmacy Preferred: [phone] Home: [phone] (home) Mobile: [phone] (mobile) E-mail: No e-mail address on record  [address], [place] [address] Phone: [phone] Fax: [phone]   Pre-screening  Beth [name] offered "in-person" vs "virtual" encounter. She indicated preferring virtual for this encounter.   Reason COVID-19*  Social distancing based on CDC and AMA recommendations.   I contacted Beth [name] on 07/29/2019 via telephone.      I clearly identified myself as Beth Santa, MD. I verified that I was speaking with the correct person using two identifiers (Name: Beth [name], and date of birth: [birth year]).  Consent I sought verbal advanced consent from Beth [name] for virtual visit interactions. I informed Beth [name] of possible security and privacy concerns, risks, and limitations associated with providing "not-in-person" medical evaluation and management services. I also informed Beth [name] of the availability of  "in-person" appointments. Finally, I informed her that there would be a charge for the virtual visit and that she could be  personally, fully or partially, financially responsible for it. Beth [name] expressed understanding and agreed to proceed.   Historic Elements   Beth [name] is a 74 y.o. year old, female patient evaluated today after her last contact with our practice on 07/21/2019. Beth [name]  has a past medical history of Allergy, Anxiety, Asthma, Chronic pain syndrome, COPD (chronic obstructive pulmonary disease) ([place]), CVA (cerebral infarction), Depression, Fibromyalgia, Headache, Hyperlipidemia, Hypertension, IBS (irritable bowel syndrome), Stroke ([place]), and Vitamin D deficiency. She also  has a past surgical history that includes Breast surgery (2011); Cervical discectomy; Appendectomy; Abdominal hysterectomy; Cholecystectomy; Sinusotomy; and Breast excisional biopsy (2011). Beth [name] has a current medication list which includes the following prescription(s): acetaminophen, albuterol, aspirin, atorvastatin, azelastine, breo ellipta, clonazepam, combivent respimat, fluoxetine, gabapentin, hydroxyzine, melatonin, metoprolol succinate, montelukast, seroquel, furosemide, and methylprednisolone, and the following Facility-Administered Medications: albuterol. She  reports that she has been smoking cigarettes. She has a 8.75 pack-year smoking history. She has never used smokeless tobacco. She reports that she does not drink alcohol and does not use drugs. Beth [name] is allergic to bextra  [valdecoxib], compazine [prochlorperazine edisylate], lithium carbonate, and lyrica [pregabalin].   HPI  Today, she is being contacted for follow-up evaluation  Patient states that she is having increased pain in her right lumbar region, right hip that is worse with weightbearing.  She states that she has fallen a couple of times in the last month.  She finds it difficult to bear weight without  having severe pain that radiates up into her hip, low back and spine.  There is an order for bilateral hip x-rays  that I want the patient to obtain and I will also order a sacrum/coccyx x-ray to make sure that there is no fracture there.  I will also prescribe a Medrol Dosepak for pain flare.  Differential includes post ablation neuritis or acute on chronic pain from lumbar degenerative disc disease and lumbar facet arthropathy.  Laboratory Chemistry Profile   Renal Lab Results  Component Value Date   BUN 22 03/23/2019   CREATININE 0.95 (H) 03/23/2019   BCR 23 (H) 03/23/2019   GFRAA 64 04/10/2018   GFRNONAA 55 (L) 04/10/2018     Hepatic Lab Results  Component Value Date   AST 15 03/23/2019   ALT 11 03/23/2019   ALBUMIN 4.0 07/15/2017   ALKPHOS 81 07/15/2017   LIPASE 27 02/13/2016     Electrolytes Lab Results  Component Value Date   NA 143 03/23/2019   K 4.8 03/23/2019   CL 107 03/23/2019   CALCIUM 9.6 03/23/2019   MG 1.8 12/25/2012     Bone Lab Results  Component Value Date   VD25OH 37 03/21/2016     Inflammation (CRP: Acute Phase) (ESR: Chronic Phase) Lab Results  Component Value Date   CRP 0.3 03/21/2016   ESRSEDRATE 1 03/21/2016   LATICACIDVEN 1.3 12/23/2015       Note: Above Lab results reviewed.   Assessment  The primary encounter diagnosis was Lumbar facet arthropathy. Diagnoses of Lumbar spondylosis, Right hip pain, Left hip pain, Chronic pain syndrome, Lumbar radiculopathy, and Coccydynia were also pertinent to this visit.  Plan of Care  [name] has a current medication list which includes the following long-term medication(s): albuterol, atorvastatin, azelastine, combivent respimat, fluoxetine, gabapentin, metoprolol succinate, montelukast, and furosemide.  Pharmacotherapy (Medications Ordered): Meds ordered this encounter  Medications  . methylPREDNISolone (MEDROL) 4 MG TBPK tablet    Sig: Follow package instructions.    Dispense:  21  tablet    Refill:  0    Do not add to the "Automatic Refill" notification system.   Orders:  Orders Placed This Encounter  Procedures  . DG Sacrum/Coccyx    Standing Status:   Future    Standing Expiration Date:   07/28/2020    Order Specific Question:   Reason for Exam (SYMPTOM  OR DIAGNOSIS REQUIRED)    Answer:   sacrum pain    Order Specific Question:   Preferred imaging location?    Answer:   [facility]    Order Specific Question:   Radiology Contrast Protocol - do NOT remove file path    Answer:   \\[email or web address]   Follow-up plan:   Return if symptoms worsen or fail to improve.     Status post bilateral facet medial branch nerve blocks L3, L4, L5 on 09/03/2017, helpful for axial low back and buttock pain: Repeat PRN.  Status post right L4-L5 epidural steroid injection on 03/09/2018, 07/29/2018 which was helpful for her right-sided hip and leg pain.  Right L4-L5 ESI #3 on 09/23/2027 not as helpful.  Right L3, L4, L5 RFA on 07/07/2019       Recent Visits Date Type Provider Dept  07/07/19 Procedure visit Beth Santa, MD [facility]  06/16/19 Telemedicine Beth Santa, MD [facility]  05/10/19 Procedure visit Beth Santa, MD [facility]  05/04/19 Office Visit Beth Santa, MD [facility]  Showing recent visits within past 90 days and meeting all other requirements Today's Visits Date Type Provider Dept  07/29/19 Telemedicine Beth Santa, MD [facility]  Showing today's visits and meeting all other requirements Future Appointments No visits were found meeting these conditions. Showing future appointments within next 90 days and meeting all other requirements  I discussed the assessment and treatment plan with the patient. The patient was provided an opportunity to ask questions and all were answered. The patient agreed with the plan and demonstrated an understanding of the  instructions.  Patient advised to call back or seek an in-person evaluation if the symptoms or condition worsens.  Duration of encounter: 36mnutes.  Note by: BGillis Santa MD Date: 07/29/2019; Time: 9:57 AM

## 2019-07-30 ENCOUNTER — Ambulatory Visit
Admission: RE | Admit: 2019-07-30 | Discharge: 2019-07-30 | Disposition: A | Discharge status: Home / Self Care | Payer: Medicare HMO | Attending: Student in an Organized Health Care Education/Training Program | Admitting: Student in an Organized Health Care Education/Training Program

## 2019-07-30 ENCOUNTER — Ambulatory Visit
Admission: RE | Admit: 2019-07-30 | Discharge: 2019-07-30 | Disposition: A | Discharge status: Home / Self Care | Payer: Medicare HMO | Source: Ambulatory Visit | Attending: Student in an Organized Health Care Education/Training Program | Admitting: Student in an Organized Health Care Education/Training Program

## 2019-07-30 DIAGNOSIS — M533 Sacrococcygeal disorders, not elsewhere classified: Secondary | ICD-10-CM | POA: Insufficient documentation

## 2019-07-30 DIAGNOSIS — M25551 Pain in right hip: Secondary | ICD-10-CM

## 2019-07-30 DIAGNOSIS — M25552 Pain in left hip: Secondary | ICD-10-CM | POA: Diagnosis not present

## 2019-07-30 DIAGNOSIS — M47816 Spondylosis without myelopathy or radiculopathy, lumbar region: Secondary | ICD-10-CM | POA: Diagnosis not present

## 2019-07-30 DIAGNOSIS — M1611 Unilateral primary osteoarthritis, right hip: Secondary | ICD-10-CM | POA: Diagnosis not present

## 2019-07-30 DIAGNOSIS — M47817 Spondylosis without myelopathy or radiculopathy, lumbosacral region: Secondary | ICD-10-CM | POA: Diagnosis not present

## 2019-08-03 ENCOUNTER — Telehealth: Payer: Self-pay | Admitting: Student in an Organized Health Care Education/Training Program

## 2019-08-03 NOTE — Telephone Encounter (Signed)
Patient would like to know results of xrays she had done on Friday 07-30-19

## 2019-08-04 NOTE — Telephone Encounter (Signed)
Spoke with patient and gave interpretation of xrays as indicated on reports.  Told patient that Dr Holley Raring would be able to interpret better and will be in the office next week if she decides she would like to see him or talk to him.

## 2019-08-06 ENCOUNTER — Telehealth: Payer: Self-pay

## 2019-08-06 ENCOUNTER — Ambulatory Visit: Payer: Self-pay | Admitting: Pharmacist

## 2019-08-06 NOTE — Chronic Care Management (AMB) (Signed)
  Chronic Care Management   Follow Up Note   08/06/2019 Name: Beth [name] MRN: [id number] DOB: Dec 29, 1945  Referred by: Verl Bangs, FNP Reason for referral : No chief complaint on file.   Beth [name] is a 74 y.o. year old female who is a primary care patient of Lorine Bears, Lupita Raider, FNP. The CCM team was consulted for assistance with chronic disease management and care coordination needs.    Was unable to reach patient via telephone today and have left HIPAA compliant voicemail asking patient to return my call.   Plan  The care management team will reach out to the patient again over the next 30 days.   [name], PharmD, [place] [employer] [phone]

## 2019-08-12 DIAGNOSIS — F332 Major depressive disorder, recurrent severe without psychotic features: Secondary | ICD-10-CM | POA: Diagnosis not present

## 2019-08-12 DIAGNOSIS — R69 Illness, unspecified: Secondary | ICD-10-CM | POA: Diagnosis not present

## 2019-08-16 ENCOUNTER — Ambulatory Visit (INDEPENDENT_AMBULATORY_CARE_PROVIDER_SITE_OTHER): Payer: Medicare HMO | Admitting: Pharmacist

## 2019-08-16 DIAGNOSIS — F332 Major depressive disorder, recurrent severe without psychotic features: Secondary | ICD-10-CM

## 2019-08-16 DIAGNOSIS — I10 Essential (primary) hypertension: Secondary | ICD-10-CM

## 2019-08-16 DIAGNOSIS — R69 Illness, unspecified: Secondary | ICD-10-CM | POA: Diagnosis not present

## 2019-08-16 DIAGNOSIS — J449 Chronic obstructive pulmonary disease, unspecified: Secondary | ICD-10-CM

## 2019-08-16 NOTE — Chronic Care Management (AMB) (Signed)
Chronic Care Management   Follow Up Note   08/16/2019 Name: Beth [name] MRN: [id number] DOB: 1945/10/22  Referred by: Beth Bangs, FNP Reason for referral : Chronic Care Management (Patient Phone Call)   Beth [name] is a 74 y.o. year old female who is a primary care patient of Beth [name], Beth [name], Beth [name]. The CCM team was consulted for assistance with chronic disease management and care coordination needs.    I reached out to Beth [name] by phone today.   Review of patient status, including review of consultants reports, relevant laboratory and other test results, and collaboration with appropriate care team members and the patient's provider was performed as part of comprehensive patient evaluation and provision of chronic care management services.     Outpatient Encounter Medications as of 08/16/2019  Medication Sig  . BREO ELLIPTA 100-25 MCG/INH AEPB INHALE 1 PUFF INTO THE LUNGS DAILY  . furosemide (LASIX) 20 MG tablet Take 1 tablet (20 mg total) by mouth daily.  . metoprolol succinate (TOPROL-XL) 25 MG 24 hr tablet TAKE 1/2 TABLET(12.5 MG) BY MOUTH DAILY  . acetaminophen (TYLENOL) 500 MG tablet Take 500 mg by mouth 2 (two) times daily as needed.   Marland Kitchen albuterol (VENTOLIN HFA) 108 (90 Base) MCG/ACT inhaler Inhale 1-2 puffs into the lungs every 6 (six) hours as needed for wheezing or shortness of breath. Use with spacer  . aspirin 81 MG chewable tablet Chew 81 mg daily by mouth.  Marland Kitchen atorvastatin (LIPITOR) 40 MG tablet TAKE 1 TABLET BY MOUTH EVERY DAY  . azelastine (ASTELIN) 0.1 % nasal spray Place 2 sprays into both nostrils 2 (two) times daily.  . clonazePAM (KLONOPIN) 0.5 MG tablet TK 1 T PO DAILY PRN  . COMBIVENT RESPIMAT 20-100 MCG/ACT AERS respimat INHALE 1 PUFF BY MOUTH EVERY 6 HOURS  . FLUoxetine (PROZAC) 20 MG tablet Take 60 mg by mouth daily.   Marland Kitchen gabapentin (NEURONTIN) 300 MG capsule Take 2 capsules (600 mg total) by mouth 3 (three) times daily.  . hydrOXYzine  (ATARAX/VISTARIL) 25 MG tablet Take 25-50 mg by mouth daily as needed.  . Melatonin 3 MG CAPS Take 10 mg by mouth at bedtime as needed (sleep).   . montelukast (SINGULAIR) 10 MG tablet TAKE 1 TABLET(10 MG) BY MOUTH AT BEDTIME  . SEROQUEL 100 MG tablet Take 100 mg by mouth at bedtime.   Facility-Administered Encounter Medications as of 08/16/2019  Medication  . albuterol (PROVENTIL) (2.5 MG/3ML) 0.083% nebulizer solution 2.5 mg    Goals Addressed            This Visit's Progress   . PharmD - Medication Management       CARE PLAN ENTRY (see longtitudinal plan of care for additional care plan information)   Current Barriers:  . Chronic Disease Management support, education, and care coordination needs related to previous CVA, COPD, hypertension, hyperlipidemia, depression  Pharmacist Clinical Goal(s):  Marland Kitchen Over the next 30 days, patient will work with CM Pharmacist to complete medication review address needs identified  Interventions: . Perform chart review. Beth [name] on importance of blood pressure monitoring and control ? Reports taking: ? Metoprolol ER 25 mg - 1/2 tablet daily ? Furosemide 20 mg - 1 tablet daily ? Note patient has home upper BP monitor ? Reports monitoring home BP as directed ? Reports last checked yesterday, BP 110/58, HR 66 ? Encourage patient to continue to monitor, keep log of BP and bring record to medical appointments ? Remind  patient to call provider for readings outside of established parameters or for new or worsening symptoms . Counsel on importance of medication adherence.  ? Again encourage patient to obtain a weekly pillbox . Coordination of care ? Confirm patient planning to attend appointment with Pulmonologist tomorrow, as referred by PCP ? Again encourage patient to reschedule missed appointment with Cardiology. Provide patient with phone number ? Reports had appointment with new psychiatrist, but again needs to locate psychiatrist as this one  is too far away. . Review in-network provider list for patient's plan for local psychiatrists as available from [facility] website with patient. ? Patient reports she will follow up with Beth [name] with [facility] to make an appointment. Provide patient with phone number.  Patient Self Care Activities:  . To self administers medications as prescribed o Encourage patient to obtain weekly pillbox to use as adherence tool . Attends all scheduled provider appointments o Appointment with Pulmonology on 7/20 . Calls pharmacy for medication refills . Calls provider office for new concerns or questions  Please see past updates related to this goal by clicking on the "Past Updates" button in the selected goal         Plan  The care management team will reach out to the patient again over the next 30 days.   Beth [name], PharmD, [place] [employer] [phone]

## 2019-08-16 NOTE — Patient Instructions (Signed)
Thank you allowing the Chronic Care Management Team to be a part of your care! It was a pleasure speaking with you today!     CCM (Chronic Care Management) Team    [name] RN, MSN, CCM Nurse Care Coordinator  [phone]   [name] PharmD  Clinical Pharmacist  [phone]   Eula Fried LCSW Clinical Social Worker [phone]  Visit Information  Goals Addressed            This Visit's Progress   . PharmD - Medication Management       CARE PLAN ENTRY (see longtitudinal plan of care for additional care plan information)   Current Barriers:  . Chronic Disease Management support, education, and care coordination needs related to previous CVA, COPD, hypertension, hyperlipidemia, depression  Pharmacist Clinical Goal(s):  Marland Kitchen Over the next 30 days, patient will work with CM Pharmacist to complete medication review address needs identified  Interventions: . Perform chart review. Myles Rosenthal on importance of blood pressure monitoring and control ? Reports taking: ? Metoprolol ER 25 mg - 1/2 tablet daily ? Furosemide 20 mg - 1 tablet daily ? Note patient has home upper BP monitor ? Reports monitoring home BP as directed ? Reports last checked yesterday, BP 110/58, HR 66 ? Encourage patient to continue to monitor, keep log of BP and bring record to medical appointments ? Remind patient to call provider for readings outside of established parameters or for new or worsening symptoms . Counsel on importance of medication adherence.  ? Again encourage patient to obtain a weekly pillbox . Coordination of care ? Confirm patient planning to attend appointment with Pulmonologist tomorrow, as referred by PCP ? Again encourage patient to reschedule missed appointment with Cardiology. Provide patient with phone number ? Reports had appointment with new psychiatrist, but again needs to locate psychiatrist as this one is too far away. . Review in-network provider list for  patient's plan for local psychiatrists as available from [facility] website with patient. ? Patient reports she will follow up with Dr. Myer Haff with [facility] to make an appointment. Provide patient with phone number.  Patient Self Care Activities:  . To self administers medications as prescribed o Encourage patient to obtain weekly pillbox to use as adherence tool . Attends all scheduled provider appointments o Appointment with Pulmonology on 7/20 . Calls pharmacy for medication refills . Calls provider office for new concerns or questions  Please see past updates related to this goal by clicking on the "Past Updates" button in the selected goal         Patient verbalizes understanding of instructions provided today.   The care management team will reach out to the patient again over the next 30 days.   [name], PharmD, [place] [employer] [phone]

## 2019-08-17 ENCOUNTER — Other Ambulatory Visit
Admission: RE | Admit: 2019-08-17 | Discharge: 2019-08-17 | Disposition: A | Discharge status: Home / Self Care | Payer: Medicare HMO | Source: Home / Self Care | Attending: Pulmonary Disease | Admitting: Pulmonary Disease

## 2019-08-17 ENCOUNTER — Ambulatory Visit
Admission: RE | Admit: 2019-08-17 | Discharge: 2019-08-17 | Disposition: A | Discharge status: Home / Self Care | Payer: Medicare HMO | Attending: Pulmonary Disease | Admitting: Pulmonary Disease

## 2019-08-17 ENCOUNTER — Other Ambulatory Visit: Payer: Self-pay | Admitting: Family Medicine

## 2019-08-17 ENCOUNTER — Encounter: Payer: Self-pay | Admitting: Pulmonary Disease

## 2019-08-17 ENCOUNTER — Ambulatory Visit
Admission: RE | Admit: 2019-08-17 | Discharge: 2019-08-17 | Disposition: A | Discharge status: Home / Self Care | Payer: Medicare HMO | Source: Ambulatory Visit | Attending: Pulmonary Disease | Admitting: Pulmonary Disease

## 2019-08-17 ENCOUNTER — Other Ambulatory Visit: Payer: Self-pay

## 2019-08-17 ENCOUNTER — Ambulatory Visit: Payer: Medicare HMO | Admitting: Pulmonary Disease

## 2019-08-17 VITALS — BP 120/60 | HR 84 | Temp 97.1°F | Ht 65.0 in | Wt 155.0 lb

## 2019-08-17 DIAGNOSIS — J449 Chronic obstructive pulmonary disease, unspecified: Secondary | ICD-10-CM

## 2019-08-17 DIAGNOSIS — R0609 Other forms of dyspnea: Secondary | ICD-10-CM

## 2019-08-17 DIAGNOSIS — R0600 Dyspnea, unspecified: Secondary | ICD-10-CM

## 2019-08-17 DIAGNOSIS — R600 Localized edema: Secondary | ICD-10-CM

## 2019-08-17 DIAGNOSIS — R69 Illness, unspecified: Secondary | ICD-10-CM | POA: Diagnosis not present

## 2019-08-17 DIAGNOSIS — F17210 Nicotine dependence, cigarettes, uncomplicated: Secondary | ICD-10-CM | POA: Diagnosis not present

## 2019-08-17 LAB — CBC WITH DIFFERENTIAL/PLATELET
Abs Immature Granulocytes: 0.03 10*3/uL (ref 0.00–0.07)
Basophils Absolute: 0 10*3/uL (ref 0.0–0.1)
Basophils Relative: 0 %
Eosinophils Absolute: 0.3 10*3/uL (ref 0.0–0.5)
Eosinophils Relative: 4 %
HCT: 39.5 % (ref 36.0–46.0)
Hemoglobin: 12.5 g/dL (ref 12.0–15.0)
Immature Granulocytes: 0 %
Lymphocytes Relative: 20 %
Lymphs Abs: 2 10*3/uL (ref 0.7–4.0)
MCH: 29.6 pg (ref 26.0–34.0)
MCHC: 31.6 g/dL (ref 30.0–36.0)
MCV: 93.6 fL (ref 80.0–100.0)
Monocytes Absolute: 0.6 10*3/uL (ref 0.1–1.0)
Monocytes Relative: 6 %
Neutro Abs: 6.9 10*3/uL (ref 1.7–7.7)
Neutrophils Relative %: 70 %
Platelets: 199 10*3/uL (ref 150–400)
RBC: 4.22 MIL/uL (ref 3.87–5.11)
RDW: 13.7 % (ref 11.5–15.5)
WBC: 9.9 10*3/uL (ref 4.0–10.5)
nRBC: 0 % (ref 0.0–0.2)

## 2019-08-17 LAB — COMPREHENSIVE METABOLIC PANEL
ALT: 25 U/L (ref 0–44)
AST: 26 U/L (ref 15–41)
Albumin: 3.9 g/dL (ref 3.5–5.0)
Alkaline Phosphatase: 98 U/L (ref 38–126)
Anion gap: 11 (ref 5–15)
BUN: 24 mg/dL — ABNORMAL HIGH (ref 8–23)
CO2: 30 mmol/L (ref 22–32)
Calcium: 9.5 mg/dL (ref 8.9–10.3)
Chloride: 101 mmol/L (ref 98–111)
Creatinine, Ser: 1.21 mg/dL — ABNORMAL HIGH (ref 0.44–1.00)
GFR calc Af Amer: 51 mL/min — ABNORMAL LOW (ref >60)
GFR calc non Af Amer: 44 mL/min — ABNORMAL LOW (ref >60)
Glucose, Bld: 101 mg/dL — ABNORMAL HIGH (ref 70–99)
Potassium: 4.2 mmol/L (ref 3.5–5.1)
Sodium: 142 mmol/L (ref 135–145)
Total Bilirubin: 0.8 mg/dL (ref 0.3–1.2)
Total Protein: 7.3 g/dL (ref 6.5–8.1)

## 2019-08-17 NOTE — Telephone Encounter (Signed)
Requested Prescriptions  Pending Prescriptions Disp Refills  . BREO ELLIPTA 100-25 MCG/INH AEPB [Pharmacy Med Name: BREO ELLIPTA 100-25MCG ORAL INH(30)] 60 each 2    Sig: INHALE 1 PUFF INTO THE LUNGS DAILY     Pulmonology:  [employer] Passed - 08/17/2019 11:56 AM      Passed - Valid encounter within last 12 months    Recent Outpatient Visits          1 month ago Benign hypertension   [facility], Lupita Raider, FNP   4 months ago Leg swelling   [facility], Lupita Raider, FNP   4 months ago Leg swelling   [facility], Lupita Raider, FNP   7 months ago COPD with acute exacerbation [facility])   [place], DO   10 months ago Perennial allergic rhinitis with seasonal variation   [place], DO      Future Appointments            In 1 week [place], Aaron Edelman, MD [facility], [place]

## 2019-08-17 NOTE — Patient Instructions (Signed)
We are doing breathing tests and a chest x-ray  We are going to get a studies of the veins of your legs to evaluate the swelling  We are going to get some blood tests  Continue your inhalers as they are right now   We will see you in follow-up in 4 to 6 weeks time call sooner should any new problems arise

## 2019-08-17 NOTE — Progress Notes (Signed)
Subjective:    Patient ID: Beth [name], female    DOB: [birth year], 74 y.o.   MRN: [id number]  HPI 74 year old current smoker (currently 3 to 5 cigarettes/day, 35-pack-year) presents for evaluation of dyspnea on exertion and swelling of legs.  She is referred by Cyndia Skeeters FNP.  She states that she has had issues with dyspnea on exertion for approximately a year.  She states that rescue inhalers help to some degree but not fully.  She has also been noticing lower extremity edema.  She has had cardiac work-up with no significant etiology noted.  She has had arterial studies of the lower extremities but not venous studies.  She does not endorse any cough or sputum production.  No hemoptysis.  She does endorse orthopnea and paroxysmal nocturnal dyspnea on occasion.  She also notes the aforementioned lower extremity edema.  Occasionally she will notice gastroesophageal reflux which also leads her to having some issues with dysphagia.  She has frequent palpitations.  She has noted 20 pounds of weight gain.  She voices no other complaint.  She does have difficulties focusing during the interview.  She reports she had asthma as a child.  She states she "outgrew it".   Review of Systems A 10 point review of systems was performed and it is as noted above otherwise negative.  Past Medical History:  Diagnosis Date   Allergy    Anxiety    Asthma    Chronic pain syndrome    discharged from pain clinic, hx of narcotics seeking behavior   COPD (chronic obstructive pulmonary disease) (HCC)    CVA (cerebral infarction)    Depression    Fibromyalgia    Headache    Hyperlipidemia    Hypertension    IBS (irritable bowel syndrome)    Stroke ([place])    Vitamin D deficiency    Past Surgical History:  Procedure Laterality Date   ABDOMINAL HYSTERECTOMY     APPENDECTOMY     BREAST EXCISIONAL BIOPSY  2011   Pt states lump removed, ? side, no scar seen   BREAST SURGERY  2011    biopsy   CERVICAL DISCECTOMY     CHOLECYSTECTOMY     SINUSOTOMY     Family History  Problem Relation Age of Onset   Anxiety disorder Mother    Depression Mother    Breast cancer Mother 68   Cancer Father    Gallbladder disease Father    Alcohol abuse Father    Depression Father    Social History   Tobacco Use   Smoking status: Light Tobacco Smoker    Packs/day: 0.25    Years: 35.00    Pack years: 8.75    Types: Cigarettes    Last attempt to quit: 12/28/2018    Years since quitting: 0.6   Smokeless tobacco: Never Used   Tobacco comment: pt has smokes 1 cigarette in the past 2-3wk--08/17/19  Substance Use Topics   Alcohol use: No    Alcohol/week: 0.0 standard drinks   No significant occupational exposure.  Working on quitting smoking as above.   Allergies  Allergen Reactions   Bextra  [Valdecoxib]    Compazine [Prochlorperazine Edisylate]     Stroke-like symptoms   Lithium Carbonate     Leg weakness   Lyrica [Pregabalin]    Current Meds  Medication Sig   acetaminophen (TYLENOL) 500 MG tablet Take 500 mg by mouth 2 (two) times daily as needed.    aspirin 81 MG  chewable tablet Chew 81 mg daily by mouth.   atorvastatin (LIPITOR) 40 MG tablet TAKE 1 TABLET BY MOUTH EVERY DAY   azelastine (ASTELIN) 0.1 % nasal spray Place 2 sprays into both nostrils 2 (two) times daily.   clonazePAM (KLONOPIN) 0.5 MG tablet TK 1 T PO DAILY PRN   COMBIVENT RESPIMAT 20-100 MCG/ACT AERS respimat INHALE 1 PUFF BY MOUTH EVERY 6 HOURS   FLUoxetine (PROZAC) 20 MG tablet Take 60 mg by mouth daily.    gabapentin (NEURONTIN) 300 MG capsule Take 2 capsules (600 mg total) by mouth 3 (three) times daily.   hydrOXYzine (ATARAX/VISTARIL) 25 MG tablet Take 25-50 mg by mouth daily as needed.   Melatonin 3 MG CAPS Take 10 mg by mouth at bedtime as needed (sleep).    montelukast (SINGULAIR) 10 MG tablet TAKE 1 TABLET(10 MG) BY MOUTH AT BEDTIME   SEROQUEL 100 MG tablet Take  100 mg by mouth at bedtime.   [DISCONTINUED] albuterol (VENTOLIN HFA) 108 (90 Base) MCG/ACT inhaler Inhale 1-2 puffs into the lungs every 6 (six) hours as needed for wheezing or shortness of breath. Use with spacer   [DISCONTINUED] BREO ELLIPTA 100-25 MCG/INH AEPB INHALE 1 PUFF INTO THE LUNGS DAILY   [DISCONTINUED] metoprolol succinate (TOPROL-XL) 25 MG 24 hr tablet TAKE 1/2 TABLET(12.5 MG) BY MOUTH DAILY   Immunization History  Administered Date(s) Administered   Influenza Split 11/13/2006, 11/02/2008   Influenza, High Dose Seasonal PF 09/27/2014, 09/29/2015, 12/30/2016, 10/15/2018   Influenza, Seasonal, Injecte, Preservative Fre 11/12/2010   Influenza,inj,Quad PF,6+ Mos 10/08/2012   Influenza-Unspecified 04/08/2014   PFIZER SARS-COV-2 Vaccination 03/14/2019, 04/09/2019   PPD Test 05/16/2016   Pneumococcal Conjugate-13 09/27/2014   Pneumococcal Polysaccharide-23 06/12/2010   Tdap 03/19/2007, 09/29/2015   Zoster 04/16/2012       Objective:   Physical Exam BP 120/60 (BP Location: Left Arm, Cuff Size: Normal)    Pulse 84    Temp (!) 97.1 F (36.2 C) (Temporal)    Ht '5\' 5"'$  (1.651 m)    Wt 155 lb (70.3 kg)    SpO2 93%    BMI 25.79 kg/m  GENERAL: Awake, alert, ambulatory.  Somewhat befuddled at times. HEAD: Normocephalic, atraumatic.  EYES: Pupils equal, round, reactive to light.  No scleral icterus.  MOUTH: Nose/mouth/throat not examined due to masking requirements for COVID 19. NECK: Supple. No thyromegaly. Trachea midline. No JVD.  No adenopathy. PULMONARY: Lungs clear to auscultation bilaterally. CARDIOVASCULAR: S1 and S2. Regular rate and rhythm.  There is Arctic sclerosis murmur grade 2/6, no gallops. GASTROINTESTINAL: Benign. MUSCULOSKELETAL: No joint deformity, no clubbing, 2-3+ lower extremity edema.   NEUROLOGIC: Forgetful, no other overt focal deficit.  Speech is fluent. SKIN: Intact,warm,dry.  Chronic stasis changes of the lower extremity. PSYCH: Flat affect,  poor focus, distracted behavior.  Echocardiogram performed 29 April 2019: This showed normal ejection fraction 60 to 65%.  Moderate elevation of the pulmonary artery systolic pressure.  Left atrial size was dilated.  There was aortic sclerosis present.  She has had arterial lower extremity vascular studies that were negative.  Chest x-ray performed 28 December 2018 and independently reviewed consistent with COPD.  There was significant hyperinflation.  Ambulatory oximetry: Resting O2 sat 92%.  Patient was only able to perform 1 lap at 250 feet saturations remained at 92 to 91%.  Patient stopped the test early due to hip pain.    Assessment & Plan:     ICD-10-CM   1. DOE (dyspnea on exertion)  R06.00 Comp  Met (CMET)    Pulmonary Function Test [facility] Only    DG Chest 2 View   Multifactorial There is evidence of pulmonary hypertension Check CBC rule out anemia PFTs and chest x-ray ordered  2. Edema of both lower extremities  R60.0 Comp Met (CMET)    CBC w/Diff    VAS [place] LOWER EXTREMITY VENOUS (DVT)   Venous Dopplers to rule out DVT Will obtain CMP to evaluate renal and hepatic function  3. COPD suggested by initial evaluation ([place])  J44.9    Continue Breo and Combivent as they are for now Reassess after PFTs  4. Tobacco dependence due to cigarettes  F17.210    Patient making attempts to quit Discussed strategies for smoking cessation Counseling time 3 to 5 minutes   Orders Placed This Encounter  Procedures   DG Chest 2 View    Standing Status:   Future    Number of Occurrences:   1    Standing Expiration Date:   02/17/2020    Order Specific Question:   Reason for Exam (SYMPTOM  OR DIAGNOSIS REQUIRED)    Answer:   sob    Order Specific Question:   Preferred imaging location?    Answer:   [facility]   Comp Met (CMET)   CBC w/Diff   Pulmonary Function Test [facility] Only    Next available.    Standing Status:   Future    Number of Occurrences:   1    Standing Expiration  Date:   08/16/2020    Order Specific Question:   Full PFT: includes the following: basic spirometry, spirometry pre & post bronchodilator, diffusion capacity (DLCO), lung volumes    Answer:   Full PFT    Discussion:  Patient presents with progressive dyspnea for over 1 years duration.  Prior chest x-rays have shown evidence of COPD.  She has not had pulmonary function testing performed.  She is currently on Breo Ellipta and as needed Combivent.  She was instructed to continue the same medications until PFTs are performed and her situation can be reassessed.  With regards to her lower extremity edema she does have evidence of pulmonary hypertension by echo.  She did not exhibit oxygen desaturations.  She may need overnight oximetry pending other studies.  Will obtain lower extremity Dopplers (venous) to evaluate for potential DVT.  CMP is pending to evaluate for potential renal/hepatic disease.  Should there be evidence of this she will be appropriately referred.  We will see the patient in follow-up in 4 to 6 weeks time she is to contact us prior to that time should any new difficulties arise.   Renold Don, MD [facility]   *This note was dictated using voice recognition software/Dragon.  Despite best efforts to proofread, errors can occur which can change the meaning.  Any change was purely unintentional.

## 2019-08-18 ENCOUNTER — Other Ambulatory Visit: Payer: Self-pay

## 2019-08-18 DIAGNOSIS — R944 Abnormal results of kidney function studies: Secondary | ICD-10-CM

## 2019-08-26 ENCOUNTER — Other Ambulatory Visit: Payer: Self-pay

## 2019-08-26 ENCOUNTER — Ambulatory Visit (INDEPENDENT_AMBULATORY_CARE_PROVIDER_SITE_OTHER): Payer: Medicare HMO

## 2019-08-26 DIAGNOSIS — R600 Localized edema: Secondary | ICD-10-CM

## 2019-08-30 ENCOUNTER — Telehealth: Payer: Self-pay | Admitting: Pulmonary Disease

## 2019-08-30 ENCOUNTER — Ambulatory Visit: Payer: Medicare HMO | Admitting: Cardiology

## 2019-08-31 NOTE — Telephone Encounter (Signed)
Pt was trying to reach [facility] about her doppler. Pt stated that she had heard from them.    Pt is aware of PFT and COVID Test prior to appointment with Rexene Edison, NP.  Nothing else needed at this time. Rhonda J Cobb

## 2019-09-02 ENCOUNTER — Other Ambulatory Visit: Payer: Self-pay | Admitting: Family Medicine

## 2019-09-02 DIAGNOSIS — I10 Essential (primary) hypertension: Secondary | ICD-10-CM

## 2019-09-03 ENCOUNTER — Telehealth: Payer: Self-pay | Admitting: Pulmonary Disease

## 2019-09-06 ENCOUNTER — Telehealth: Payer: Self-pay

## 2019-09-06 NOTE — Telephone Encounter (Signed)
Lm to relay date/time of covid test prior to PFT.  09[facility]08/[facility]

## 2019-09-07 NOTE — Telephone Encounter (Signed)
Called and spoke with patient and advised that the referral to to kidney specialist sometimes takes a while before you will hear from them.  Patient voiced understanding.  Nothing else needed at this time. Rhonda J Cobb

## 2019-09-07 NOTE — Telephone Encounter (Signed)
Pt aware of below message and voiced her understanding.  Nothing further is needed.

## 2019-09-08 ENCOUNTER — Other Ambulatory Visit: Payer: Self-pay

## 2019-09-08 ENCOUNTER — Other Ambulatory Visit
Admission: RE | Admit: 2019-09-08 | Discharge: 2019-09-08 | Disposition: A | Discharge status: Home / Self Care | Payer: Medicare HMO | Source: Ambulatory Visit | Attending: Adult Health | Admitting: Adult Health

## 2019-09-08 DIAGNOSIS — Z01812 Encounter for preprocedural laboratory examination: Secondary | ICD-10-CM | POA: Diagnosis not present

## 2019-09-08 DIAGNOSIS — Z20822 Contact with and (suspected) exposure to covid-19: Secondary | ICD-10-CM | POA: Insufficient documentation

## 2019-09-08 LAB — SARS CORONAVIRUS 2 (TAT 6-24 HRS): SARS Coronavirus 2: NEGATIVE

## 2019-09-09 ENCOUNTER — Encounter: Payer: Self-pay | Admitting: Pulmonary Disease

## 2019-09-09 ENCOUNTER — Ambulatory Visit: Payer: Medicare HMO | Attending: Pulmonary Disease

## 2019-09-09 DIAGNOSIS — Z7951 Long term (current) use of inhaled steroids: Secondary | ICD-10-CM | POA: Insufficient documentation

## 2019-09-09 DIAGNOSIS — F17210 Nicotine dependence, cigarettes, uncomplicated: Secondary | ICD-10-CM | POA: Insufficient documentation

## 2019-09-09 DIAGNOSIS — R0600 Dyspnea, unspecified: Secondary | ICD-10-CM

## 2019-09-09 DIAGNOSIS — R69 Illness, unspecified: Secondary | ICD-10-CM | POA: Diagnosis not present

## 2019-09-09 DIAGNOSIS — R0609 Other forms of dyspnea: Secondary | ICD-10-CM

## 2019-09-09 LAB — PULMONARY FUNCTION TEST ARMC ONLY
DL/VA % pred: 50 %
DL/VA: 2.05 ml/min/mmHg/L
DLCO unc % pred: 39 %
DLCO unc: 7.9 ml/min/mmHg
FEF 25-75 Post: 0.46 L/sec
FEF 25-75 Pre: 0.71 L/sec
FEF2575-%Change-Post: -35 %
FEF2575-%Pred-Post: 26 %
FEF2575-%Pred-Pre: 41 %
FEV1-%Change-Post: -12 %
FEV1-%Pred-Post: 51 %
FEV1-%Pred-Pre: 58 %
FEV1-Post: 1.13 L
FEV1-Pre: 1.28 L
FEV1FVC-%Change-Post: -8 %
FEV1FVC-%Pred-Pre: 87 %
FEV6-%Change-Post: -5 %
FEV6-%Pred-Post: 65 %
FEV6-%Pred-Pre: 69 %
FEV6-Post: 1.83 L
FEV6-Pre: 1.94 L
FEV6FVC-%Change-Post: -2 %
FEV6FVC-%Pred-Post: 102 %
FEV6FVC-%Pred-Pre: 104 %
FVC-%Change-Post: -3 %
FVC-%Pred-Post: 63 %
FVC-%Pred-Pre: 66 %
FVC-Post: 1.88 L
Post FEV1/FVC ratio: 60 %
Post FEV6/FVC ratio: 97 %
Pre FEV1/FVC ratio: 66 %
Pre FEV6/FVC Ratio: 99 %
RV % pred: 109 %
RV: 2.55 L
TLC % pred: 80 %
TLC: 4.2 L

## 2019-09-09 MED ORDER — ALBUTEROL SULFATE (2.5 MG/3ML) 0.083% IN NEBU
2.5000 mg | INHALATION_SOLUTION | Freq: Once | RESPIRATORY_TRACT | Status: AC
  Administered 2019-09-09: 2.5 mg via RESPIRATORY_TRACT
  Filled 2019-09-09: qty 3

## 2019-09-10 ENCOUNTER — Ambulatory Visit: Payer: Medicare HMO

## 2019-09-13 ENCOUNTER — Ambulatory Visit (INDEPENDENT_AMBULATORY_CARE_PROVIDER_SITE_OTHER): Payer: Medicare HMO | Admitting: Pharmacist

## 2019-09-13 DIAGNOSIS — I10 Essential (primary) hypertension: Secondary | ICD-10-CM | POA: Diagnosis not present

## 2019-09-13 DIAGNOSIS — J449 Chronic obstructive pulmonary disease, unspecified: Secondary | ICD-10-CM

## 2019-09-13 DIAGNOSIS — R69 Illness, unspecified: Secondary | ICD-10-CM | POA: Diagnosis not present

## 2019-09-13 DIAGNOSIS — F332 Major depressive disorder, recurrent severe without psychotic features: Secondary | ICD-10-CM | POA: Diagnosis not present

## 2019-09-13 NOTE — Patient Instructions (Signed)
Thank you allowing the Chronic Care Management Team to be a part of your care! It was a pleasure speaking with you today!     CCM (Chronic Care Management) Team    Pam Tate RN, MSN, CCM Nurse Care Coordinator  [phone]     PharmD  Clinical Pharmacist  [phone]   Brooke Joyce LCSW Clinical Social Worker [phone]  Visit Information  Goals Addressed            This Visit's Progress   . PharmD - Medication Management       CARE PLAN ENTRY (see longtitudinal plan of care for additional care plan information)   Current Barriers:  . Chronic Disease Management support, education, and care coordination needs related to previous CVA, COPD, hypertension, hyperlipidemia, depression  Pharmacist Clinical Goal(s):  . Over the next 30 days, patient will work with CM Pharmacist to complete medication review address needs identified  Interventions: . Perform chart review.  ? Note patient seen by Pulmonologist on 7/20 ? Provider advised patient to: continue Breo and as needed Combivent ? Scheduled for PFT ? Lab work completed - Scr elevated, 1.21; eGFR ~44  ? Referral placed to [facility] ? PFT completed on 8/12 ? Follow up with Pulmonologist scheduled for tomorrow, 8/17 . Counsel on importance of blood pressure monitoring and control ? Note patient has home upper BP monitor ? Reports monitoring home BP as directed ? Reports last checked 8/14, BP 116/55, HR 75 ? Encourage patient to continue to monitor, keep log of BP and bring record to medical appointments . Counsel on importance of medication adherence.  ? Again encourage patient to obtain a weekly pillbox ? Ms. Linney states she looked at [facility], but did not see one. Advise patient to ask store employee to direct her to this section next time she goes to pharmacy. . Coordination of care - Note patient previously reported that she needed to establish care with a new  psychiatrist ? Patient reports she has not yet followed up with Dr. Hansen Su with [facility] to make an appointment.  . Will collaborate with patient's pain specialist to confirm provider aware of patient's latest kidney function results for consideration of gabapentin renal dose adjustment.  Patient Self Care Activities:  . To self administers medications as prescribed o Encourage patient to obtain weekly pillbox to use as adherence tool . Attends all scheduled provider appointments . Calls pharmacy for medication refills . Calls provider office for new concerns or questions  Please see past updates related to this goal by clicking on the "Past Updates" button in the selected goal         Patient verbalizes understanding of instructions provided today.   Telephone follow up appointment with care management team member scheduled for: 10/18 at 9:45 am   , PharmD, BCACP Clinical Pharmacist [facility] [phone]  

## 2019-09-13 NOTE — Chronic Care Management (AMB) (Signed)
Chronic Care Management   Follow Up Note   09/13/2019 Name: Beth [name] MRN: [id number] DOB: 01-18-1946  Referred by: Beth Bangs, FNP Reason for referral : Chronic Care Management (Patient Phone Call)   Beth [name] is a 74 y.o. year old female who is a primary care patient of Beth [name], Beth [name], Beth [name]. The CCM team was consulted for assistance with chronic disease management and care coordination needs.    I reached out to Beth [name] by phone today.   Review of patient status, including review of consultants reports, relevant laboratory and other test results, and collaboration with appropriate care team members and the patient's provider was performed as part of comprehensive patient evaluation and provision of chronic care management services.    Objective  Lab Results  Component Value Date   CREATININE 1.21 (H) 08/17/2019   BUN 24 (H) 08/17/2019   NA 142 08/17/2019   K 4.2 08/17/2019   CL 101 08/17/2019   CO2 30 08/17/2019  Calculated CrCl ~40 mL/min (based on adjusted body weight)   Outpatient Encounter Medications as of 09/13/2019  Medication Sig  . acetaminophen (TYLENOL) 500 MG tablet Take 500 mg by mouth 2 (two) times daily as needed.   Marland Kitchen aspirin 81 MG chewable tablet Chew 81 mg daily by mouth.  Marland Kitchen atorvastatin (LIPITOR) 40 MG tablet TAKE 1 TABLET BY MOUTH EVERY DAY  . azelastine (ASTELIN) 0.1 % nasal spray Place 2 sprays into both nostrils 2 (two) times daily.  Marland Kitchen BREO ELLIPTA 100-25 MCG/INH AEPB INHALE 1 PUFF INTO THE LUNGS DAILY  . clonazePAM (KLONOPIN) 0.5 MG tablet TK 1 T PO DAILY PRN  . COMBIVENT RESPIMAT 20-100 MCG/ACT AERS respimat INHALE 1 PUFF BY MOUTH EVERY 6 HOURS  . FLUoxetine (PROZAC) 20 MG tablet Take 60 mg by mouth daily.   . furosemide (LASIX) 20 MG tablet Take 1 tablet (20 mg total) by mouth daily.  Marland Kitchen gabapentin (NEURONTIN) 300 MG capsule Take 2 capsules (600 mg total) by mouth 3 (three) times daily.  . hydrOXYzine (ATARAX/VISTARIL) 25  MG tablet Take 25-50 mg by mouth daily as needed.  . Melatonin 3 MG CAPS Take 10 mg by mouth at bedtime as needed (sleep).   . metoprolol succinate (TOPROL-XL) 25 MG 24 hr tablet TAKE 1/2 TABLET(12.5 MG) BY MOUTH DAILY  . montelukast (SINGULAIR) 10 MG tablet TAKE 1 TABLET(10 MG) BY MOUTH AT BEDTIME  . SEROQUEL 100 MG tablet Take 100 mg by mouth at bedtime.   Facility-Administered Encounter Medications as of 09/13/2019  Medication  . albuterol (PROVENTIL) (2.5 MG/3ML) 0.083% nebulizer solution 2.5 mg    Goals Addressed            This Visit's Progress   . PharmD - Medication Management       CARE PLAN ENTRY (see longtitudinal plan of care for additional care plan information)   Current Barriers:  . Chronic Disease Management support, education, and care coordination needs related to previous CVA, COPD, hypertension, hyperlipidemia, depression  Pharmacist Clinical Goal(s):  Marland Kitchen Over the next 30 days, patient will work with CM Pharmacist to complete medication review address needs identified  Interventions: . Perform chart review.  ? Note patient seen by Pulmonologist on 7/20 ? Provider advised patient to: continue Breo and as needed Combivent ? Scheduled for PFT ? Lab work completed - Scr elevated, 1.21; eGFR ~44  ? Referral placed to [facility] ? PFT completed on 8/12 ? Follow up with Pulmonologist  scheduled for tomorrow, 8/17 . Counsel on importance of blood pressure monitoring and control ? Note patient has home upper BP monitor ? Reports monitoring home BP as directed ? Reports last checked 8/14, BP 116/55, HR 75 ? Encourage patient to continue to monitor, keep log of BP and bring record to medical appointments . Counsel on importance of medication adherence.  ? Again encourage patient to obtain a weekly pillbox ? Beth [name] states she looked at [employer], but did not see one. Advise patient to ask store employee to direct her to this section next  time she goes to pharmacy. . Coordination of care - Note patient previously reported that she needed to establish care with a new psychiatrist ? Patient reports she has not yet followed up with Beth [name] with [facility] to make an appointment.  . Will collaborate with patient's pain specialist to confirm provider aware of patient's latest kidney function results for consideration of gabapentin renal dose adjustment.  Patient Self Care Activities:  . To self administers medications as prescribed o Encourage patient to obtain weekly pillbox to use as adherence tool . Attends all scheduled provider appointments . Calls pharmacy for medication refills . Calls provider office for new concerns or questions  Please see past updates related to this goal by clicking on the "Past Updates" button in the selected goal         Plan  Telephone follow up appointment with care management team member scheduled for: 10/18 at 9:45 am  Beth [name], PharmD, [place] [phone]

## 2019-09-14 ENCOUNTER — Ambulatory Visit: Payer: Medicare HMO | Admitting: Adult Health

## 2019-09-14 ENCOUNTER — Other Ambulatory Visit: Payer: Self-pay

## 2019-09-14 ENCOUNTER — Encounter: Payer: Self-pay | Admitting: Adult Health

## 2019-09-14 DIAGNOSIS — F17200 Nicotine dependence, unspecified, uncomplicated: Secondary | ICD-10-CM

## 2019-09-14 DIAGNOSIS — J449 Chronic obstructive pulmonary disease, unspecified: Secondary | ICD-10-CM | POA: Diagnosis not present

## 2019-09-14 DIAGNOSIS — I272 Pulmonary hypertension, unspecified: Secondary | ICD-10-CM | POA: Insufficient documentation

## 2019-09-14 DIAGNOSIS — R69 Illness, unspecified: Secondary | ICD-10-CM | POA: Diagnosis not present

## 2019-09-14 MED ORDER — TRELEGY ELLIPTA 200-62.5-25 MCG/INH IN AEPB: 1.0000 | INHALATION_SPRAY | Freq: Every day | RESPIRATORY_TRACT | 11 refills | Status: DC

## 2019-09-14 NOTE — Assessment & Plan Note (Signed)
Smoking cessation discussed 

## 2019-09-14 NOTE — Progress Notes (Signed)
@Patient  ID: Beth [name], female    DOB: [birth year], 74 y.o.   MRN: [id number]  Chief Complaint  Patient presents with  . Follow-up    COPD     Referring provider: Verl Bangs, FNP  HPI: 74 year old female active smoker 35 -pack-year seen for pulmonary consult August 17, 2019 for dyspnea.  Medical history is significant for hypertension, GERD, CVA with left-sided hemiparesis  TEST/EVENTS :  2D echo April 29, 2019 EF 60 to 65%, moderately elevated pulmonary artery systolic pressure  Myoview May 11, 2019 low risk study, normal myocardial perfusion stress test without significant ischemia.  Venous Dopplers August 26, 2019 - for DVT  Chest x-ray August 18, 2019 clear   09/14/2019 Follow up : COPD  Patient returns for a 1 month follow-up.  Patient was seen last visit for a pulmonary consult for shortness of breath.  Patient carries a diagnosis of asthma and was on Breo.  She is a long time heavy smoker.  35-pack-year.  She has cut back on smoking only smoking 1 to 3 cigarettes daily.  Patient complains of increased shortness of breath with activity decreased activity tolerance.  Has intermittent cough that is minimally productive.  Patient was set up for pulmonary function testing that showed moderate airflow obstruction with an FEV1 at 58%, ratio 66, FVC 66%.  DLCO was decreased at 39%.  Patient says she is active at home.  Can do her own housework.  She does not drive.  Chest x-ray last visit showed clear lungs.  Renal function was decreased.  Patient was referred to nephrology.  Venous Dopplers were done and negative for DVT.  Allergies  Allergen Reactions  . Bextra  [Valdecoxib]   . Compazine [Prochlorperazine Edisylate]     Stroke-like symptoms  . Lithium Carbonate     Leg weakness  . Lyrica [Pregabalin]     Immunization History  Administered Date(s) Administered  . Influenza Split 11/13/2006, 11/02/2008  . Influenza, High Dose Seasonal PF 09/27/2014, 09/29/2015,  12/30/2016, 10/15/2018  . Influenza, Seasonal, Injecte, Preservative Fre 11/12/2010  . Influenza,inj,Quad PF,6+ Mos 10/08/2012  . Influenza-Unspecified 04/08/2014  . PFIZER SARS-COV-2 Vaccination 03/14/2019, 04/09/2019  . PPD Test 05/16/2016  . Pneumococcal Conjugate-13 09/27/2014  . Pneumococcal Polysaccharide-23 06/12/2010  . Tdap 03/19/2007, 09/29/2015  . Zoster 04/16/2012    Past Medical History:  Diagnosis Date  . Allergy   . Anxiety   . Asthma   . Chronic pain syndrome    discharged from pain clinic, hx of narcotics seeking behavior  . COPD (chronic obstructive pulmonary disease) ([place])   . CVA (cerebral infarction)   . Depression   . Fibromyalgia   . Headache   . Hyperlipidemia   . Hypertension   . IBS (irritable bowel syndrome)   . Stroke ([place])   . Vitamin D deficiency     Tobacco History: Social History   Tobacco Use  Smoking Status Light Tobacco Smoker  . Packs/day: 0.25  . Years: 35.00  . Pack years: 8.75  . Types: Cigarettes  Smokeless Tobacco Never Used  Tobacco Comment   1 pack will last 3 weeks--09/14/2019   Ready to quit: No Counseling given: Yes Comment: 1 pack will last 3 weeks--09/14/2019   Outpatient Medications Prior to Visit  Medication Sig Dispense Refill  . acetaminophen (TYLENOL) 500 MG tablet Take 500 mg by mouth 2 (two) times daily as needed.     Marland Kitchen aspirin 81 MG chewable tablet Chew 81 mg daily by mouth.    Marland Kitchen  atorvastatin (LIPITOR) 40 MG tablet TAKE 1 TABLET BY MOUTH EVERY DAY 90 tablet 0  . azelastine (ASTELIN) 0.1 % nasal spray Place 2 sprays into both nostrils 2 (two) times daily. 30 mL 2  . BREO ELLIPTA 100-25 MCG/INH AEPB INHALE 1 PUFF INTO THE LUNGS DAILY 60 each 2  . clonazePAM (KLONOPIN) 0.5 MG tablet TK 1 T PO DAILY PRN    . COMBIVENT RESPIMAT 20-100 MCG/ACT AERS respimat INHALE 1 PUFF BY MOUTH EVERY 6 HOURS 4 g 1  . FLUoxetine (PROZAC) 20 MG tablet Take 60 mg by mouth daily.     Marland Kitchen gabapentin (NEURONTIN) 300 MG capsule Take 2  capsules (600 mg total) by mouth 3 (three) times daily. 180 capsule 5  . hydrOXYzine (ATARAX/VISTARIL) 25 MG tablet Take 25-50 mg by mouth daily as needed.    . Melatonin 3 MG CAPS Take 10 mg by mouth at bedtime as needed (sleep).     . metoprolol succinate (TOPROL-XL) 25 MG 24 hr tablet TAKE 1/2 TABLET(12.5 MG) BY MOUTH DAILY 45 tablet 2  . montelukast (SINGULAIR) 10 MG tablet TAKE 1 TABLET(10 MG) BY MOUTH AT BEDTIME 90 tablet 0  . SEROQUEL 100 MG tablet Take 100 mg by mouth at bedtime.    . furosemide (LASIX) 20 MG tablet Take 1 tablet (20 mg total) by mouth daily. 90 tablet 3   Facility-Administered Medications Prior to Visit  Medication Dose Route Frequency Provider Last Rate Last Admin  . albuterol (PROVENTIL) (2.5 MG/3ML) 0.083% nebulizer solution 2.5 mg  2.5 mg Nebulization Once Poulose, Bethel Born, NP         Review of Systems:   Constitutional:   No  weight loss, night sweats,  Fevers, chills,  +fatigue, or  lassitude.  HEENT:   No headaches,  Difficulty swallowing,  Tooth/dental problems, or  Sore throat,                No sneezing, itching, ear ache, nasal congestion, post nasal drip,   CV:  No chest pain,  Orthopnea, PND, swelling in lower extremities, anasarca, dizziness, palpitations, syncope.   GI  No heartburn, indigestion, abdominal pain, nausea, vomiting, diarrhea, change in bowel habits, loss of appetite, bloody stools.   Resp:  No change in color of mucus.  No wheezing.  No chest wall deformity  Skin: no rash or lesions.  GU: no dysuria, change in color of urine, no urgency or frequency.  No flank pain, no hematuria   MS:  No joint pain or swelling.  No decreased range of motion.  No back pain.    Physical Exam  BP 122/62 (BP Location: Left Arm, Cuff Size: Normal)   Pulse 81   Temp (!) 97.1 F (36.2 C) (Temporal)   Ht 5\' 5"  (1.651 m)   Wt 157 lb 6.4 oz (71.4 kg)   SpO2 100%   BMI 26.19 kg/m   GEN: A/Ox3; pleasant , NAD, well nourished    HEENT:   [place]/AT,   NOSE-clear, THROAT-clear, no lesions, no postnasal drip or exudate noted.   NECK:  Supple w/ fair ROM; no JVD; normal carotid impulses w/o bruits; no thyromegaly or nodules palpated; no lymphadenopathy.    RESP  Clear  P & A; w/o, wheezes/ rales/ or rhonchi. no accessory muscle use, no dullness to percussion  CARD:  RRR, no m/r/g, no peripheral edema, pulses intact, no cyanosis or clubbing.  GI:   Soft & nt; nml bowel sounds; no organomegaly or masses detected.  Musco: Warm bil, no deformities or joint swelling noted.   Neuro: alert, no focal deficits noted.  Left-sided upper arm weakness, unsteady gait.  Can get onto table with minimum assist.  Skin: Warm, no lesions or rashes    Lab Results:  CBC    Component Value Date/Time   WBC 9.9 08/17/2019 1238   RBC 4.22 08/17/2019 1238   HGB 12.5 08/17/2019 1238   HGB 12.2 12/25/2012 1449   HCT 39.5 08/17/2019 1238   HCT 36.1 12/25/2012 1449   PLT 199 08/17/2019 1238   PLT 166 12/29/2012 0553   MCV 93.6 08/17/2019 1238   MCV 88 12/25/2012 1449   MCH 29.6 08/17/2019 1238   MCHC 31.6 08/17/2019 1238   RDW 13.7 08/17/2019 1238   RDW 15.6 (H) 12/25/2012 1449   LYMPHSABS 2.0 08/17/2019 1238   LYMPHSABS 2.6 12/25/2012 1449   MONOABS 0.6 08/17/2019 1238   MONOABS 0.4 12/25/2012 1449   EOSABS 0.3 08/17/2019 1238   EOSABS 0.1 12/25/2012 1449   BASOSABS 0.0 08/17/2019 1238   BASOSABS 0.1 12/25/2012 1449    BMET    Component Value Date/Time   NA 142 08/17/2019 1238   NA 145 07/16/2016 0549   NA 144 12/26/2012 0520   K 4.2 08/17/2019 1238   K 3.3 (L) 12/26/2012 0520   CL 101 08/17/2019 1238   CL 114 (H) 12/26/2012 0520   CO2 30 08/17/2019 1238   CO2 24 12/26/2012 0520   GLUCOSE 101 (H) 08/17/2019 1238   GLUCOSE 85 12/26/2012 0520   BUN 24 (H) 08/17/2019 1238   BUN 7 07/16/2016 0549   BUN 13 12/26/2012 0520   CREATININE 1.21 (H) 08/17/2019 1238   CREATININE 0.95 (H) 03/23/2019 1152   CALCIUM 9.5 08/17/2019 1238    CALCIUM 9.1 12/26/2012 0520   GFRNONAA 44 (L) 08/17/2019 1238   GFRNONAA 55 (L) 04/10/2018 1115   GFRAA 51 (L) 08/17/2019 1238   GFRAA 64 04/10/2018 1115    BNP No results found for: BNP  ProBNP No results found for: PROBNP  Imaging: DG Chest 2 View  Result Date: 08/18/2019 CLINICAL DATA:  Shortness of breath, dyspnea on exertion EXAM: CHEST - 2 VIEW COMPARISON:  12/28/2018 FINDINGS: The lungs are hyperinflated likely secondary to COPD. There is no focal consolidation. There is no pleural effusion or pneumothorax. The heart and mediastinal contours are unremarkable. There is no acute osseous abnormality. IMPRESSION: No active cardiopulmonary disease. Electronically Signed   By: Kathreen Devoid   On: 08/18/2019 15:25   Pulmonary Function Test [facility] Only  Result Date: 09/14/2019 Spirometry Data Is Acceptable and Reproducible Moderate Obstructive Airways Disease without  Significant Broncho-Dilator Response Consider underlying Restrictive Lung disease Consider outpatient Pulmonary Consultation if needed Clinical Correlation Advised   VAS [place] LOWER EXTREMITY VENOUS (DVT)  Result Date: 08/27/2019  Lower Venous DVTStudy Other Indications: Pain and swelling, bilateral legs. L>R. Risk Factors: None identified. Comparison Study: None Performing Technologist: Pilar Jarvis RDMS, RVT, RDCS  Examination Guidelines: A complete evaluation includes B-mode imaging, spectral Doppler, color Doppler, and power Doppler as needed of all accessible portions of each vessel. Bilateral testing is considered an integral part of a complete examination. Limited examinations for reoccurring indications may be performed as noted. The reflux portion of the exam is performed with the patient in reverse Trendelenburg.  +---------+---------------+---------+-----------+----------+--------------+ RIGHT    CompressibilityPhasicitySpontaneityPropertiesThrombus Aging  +---------+---------------+---------+-----------+----------+--------------+ CFV      Full           Yes  Yes                                 +---------+---------------+---------+-----------+----------+--------------+ SFJ      Full           Yes      Yes                                 +---------+---------------+---------+-----------+----------+--------------+ FV Prox  Full           Yes      Yes                                 +---------+---------------+---------+-----------+----------+--------------+ FV Mid   Full           Yes      Yes                                 +---------+---------------+---------+-----------+----------+--------------+ FV DistalFull           Yes      Yes                                 +---------+---------------+---------+-----------+----------+--------------+ PFV      Full           Yes      Yes                                 +---------+---------------+---------+-----------+----------+--------------+ POP      Full           Yes      Yes                                 +---------+---------------+---------+-----------+----------+--------------+ PTV      Full                                                        +---------+---------------+---------+-----------+----------+--------------+ PERO     Full                                                        +---------+---------------+---------+-----------+----------+--------------+ Soleal   Full                                                        +---------+---------------+---------+-----------+----------+--------------+ Gastroc  Full                                                        +---------+---------------+---------+-----------+----------+--------------+  GSV      Full           Yes      Yes                                 +---------+---------------+---------+-----------+----------+--------------+ SSV      Full                                                         +---------+---------------+---------+-----------+----------+--------------+   +---------+---------------+---------+-----------+----------+--------------+ LEFT     CompressibilityPhasicitySpontaneityPropertiesThrombus Aging +---------+---------------+---------+-----------+----------+--------------+ CFV      Full           Yes      Yes                                 +---------+---------------+---------+-----------+----------+--------------+ SFJ      Full           Yes      Yes                                 +---------+---------------+---------+-----------+----------+--------------+ FV Prox  Full           Yes      Yes                                 +---------+---------------+---------+-----------+----------+--------------+ FV Mid   Full           Yes      Yes                                 +---------+---------------+---------+-----------+----------+--------------+ FV DistalFull           Yes      Yes                                 +---------+---------------+---------+-----------+----------+--------------+ PFV      Full           Yes      Yes                                 +---------+---------------+---------+-----------+----------+--------------+ POP      Full           Yes      Yes                                 +---------+---------------+---------+-----------+----------+--------------+ PTV      Full                                                        +---------+---------------+---------+-----------+----------+--------------+ PERO     Full                                                        +---------+---------------+---------+-----------+----------+--------------+  Soleal   Full                                                        +---------+---------------+---------+-----------+----------+--------------+ Gastroc  Full                                                         +---------+---------------+---------+-----------+----------+--------------+ GSV      Full           Yes      Yes                                 +---------+---------------+---------+-----------+----------+--------------+ SSV      Full                                                        +---------+---------------+---------+-----------+----------+--------------+     Summary: BILATERAL: - No evidence of deep vein thrombosis seen in the lower extremities, bilaterally. -   *See table(s) above for measurements and observations. Electronically signed by Kathlyn [place] MD on 08/27/2019 at 12:46:56 PM.    Final     albuterol (PROVENTIL) (2.5 MG/3ML) 0.083% nebulizer solution 2.5 mg    Date Action Dose Route User   09/09/2019 1149 Given  Nebulization Dwaine Deter H, RT      No flowsheet data found.  No results found for: NITRICOXIDE      Assessment & Plan:   COPD, severe Severe COPD.  PFTs show moderate to severe COPD with a severe diffusing defect.  Patient has high symptom burden.  Suspect deconditioning is playing a role.  Patient is unable to go to pulmonary rehab due to lack of transportation. We will expand maintenance regimen change Breo to Trelegy. Advised to do activities as tolerated.  O2 saturations are 100% on room air. Refer to low-dose CT screening program Plan  Patient Instructions  Refer to for Lung cancer screening CT (50-PPY )  Stop BREO  Begin TRELEGY 1 puff daily , rinse after use.  Activity as tolerated.  Work on not smoking  Follow up with Dr. Patsey Berthold in 3 months and As needed          Smoking Smoking cessation discussed  Pulmonary hypertension ([place]) Moderate elevated pulmonary artery pressures noted on 2D echo April 2021.  Patient does have underlying moderate to severe COPD.  She does not appear to be volume overloaded.  No congestive heart failure on echo.  O2 saturations are adequate.  Continue to monitor closely.  May need to further  evaluate going forward.       Rexene Edison, NP 09/14/2019

## 2019-09-14 NOTE — Patient Instructions (Addendum)
Refer to for Lung cancer screening CT (50-PPY )  Stop BREO  Begin TRELEGY 1 puff daily , rinse after use.  Activity as tolerated.  Work on not smoking  Follow up with Dr. Patsey Berthold in 3 months and As needed

## 2019-09-14 NOTE — Assessment & Plan Note (Signed)
Severe COPD.  PFTs show moderate to severe COPD with a severe diffusing defect.  Patient has high symptom burden.  Suspect deconditioning is playing a role.  Patient is unable to go to pulmonary rehab due to lack of transportation. We will expand maintenance regimen change Breo to Trelegy. Advised to do activities as tolerated.  O2 saturations are 100% on room air. Refer to low-dose CT screening program Plan  Patient Instructions  Refer to for Lung cancer screening CT (50-PPY )  Stop BREO  Begin TRELEGY 1 puff daily , rinse after use.  Activity as tolerated.  Work on not smoking  Follow up with Dr. Patsey Berthold in 3 months and As needed

## 2019-09-14 NOTE — Assessment & Plan Note (Signed)
Moderate elevated pulmonary artery pressures noted on 2D echo April 2021.  Patient does have underlying moderate to severe COPD.  She does not appear to be volume overloaded.  No congestive heart failure on echo.  O2 saturations are adequate.  Continue to monitor closely.  May need to further evaluate going forward.

## 2019-09-16 ENCOUNTER — Other Ambulatory Visit: Payer: Self-pay | Admitting: Family Medicine

## 2019-09-16 ENCOUNTER — Telehealth: Payer: Self-pay | Admitting: General Practice

## 2019-09-16 ENCOUNTER — Ambulatory Visit: Payer: Self-pay | Admitting: General Practice

## 2019-09-16 DIAGNOSIS — I10 Essential (primary) hypertension: Secondary | ICD-10-CM

## 2019-09-16 DIAGNOSIS — J449 Chronic obstructive pulmonary disease, unspecified: Secondary | ICD-10-CM

## 2019-09-16 DIAGNOSIS — R0609 Other forms of dyspnea: Secondary | ICD-10-CM

## 2019-09-16 DIAGNOSIS — F332 Major depressive disorder, recurrent severe without psychotic features: Secondary | ICD-10-CM

## 2019-09-16 DIAGNOSIS — J3089 Other allergic rhinitis: Secondary | ICD-10-CM

## 2019-09-16 DIAGNOSIS — R600 Localized edema: Secondary | ICD-10-CM

## 2019-09-16 DIAGNOSIS — R69 Illness, unspecified: Secondary | ICD-10-CM | POA: Diagnosis not present

## 2019-09-16 DIAGNOSIS — J302 Other seasonal allergic rhinitis: Secondary | ICD-10-CM

## 2019-09-16 DIAGNOSIS — M7989 Other specified soft tissue disorders: Secondary | ICD-10-CM

## 2019-09-16 DIAGNOSIS — R0600 Dyspnea, unspecified: Secondary | ICD-10-CM

## 2019-09-16 NOTE — Chronic Care Management (AMB) (Signed)
Chronic Care Management   Follow Up Note   09/16/2019 Name: Beth [name] MRN: [id number] DOB: 01-Dec-1945  Referred by: Verl Bangs, FNP Reason for referral : Chronic Care Management (RNCM Follow up for Chronic Disease Management and Care Coordination Needs)   Beth [name] is a 74 y.o. year old female who is a primary care patient of Verl Bangs, FNP. The CCM team was consulted for assistance with chronic disease management and care coordination needs.    Review of patient status, including review of consultants reports, relevant laboratory and other test results, and collaboration with appropriate care team members and the patient's provider was performed as part of comprehensive patient evaluation and provision of chronic care management services.    SDOH (Social Determinants of Health) assessments performed: Yes See Care Plan activities for detailed interventions related to [facility])     Outpatient Encounter Medications as of 09/16/2019  Medication Sig   acetaminophen (TYLENOL) 500 MG tablet Take 500 mg by mouth 2 (two) times daily as needed.    aspirin 81 MG chewable tablet Chew 81 mg daily by mouth.   atorvastatin (LIPITOR) 40 MG tablet TAKE 1 TABLET BY MOUTH EVERY DAY   azelastine (ASTELIN) 0.1 % nasal spray Place 2 sprays into both nostrils 2 (two) times daily.   BREO ELLIPTA 100-25 MCG/INH AEPB INHALE 1 PUFF INTO THE LUNGS DAILY   clonazePAM (KLONOPIN) 0.5 MG tablet TK 1 T PO DAILY PRN   COMBIVENT RESPIMAT 20-100 MCG/ACT AERS respimat INHALE 1 PUFF BY MOUTH EVERY 6 HOURS   FLUoxetine (PROZAC) 20 MG tablet Take 60 mg by mouth daily.    Fluticasone-Umeclidin-Vilant (TRELEGY ELLIPTA) 200-62.5-25 MCG/INH AEPB Inhale 1 puff into the lungs daily.   furosemide (LASIX) 20 MG tablet Take 1 tablet (20 mg total) by mouth daily.   gabapentin (NEURONTIN) 300 MG capsule Take 2 capsules (600 mg total) by mouth 3 (three) times daily.   hydrOXYzine (ATARAX/VISTARIL) 25 MG  tablet Take 25-50 mg by mouth daily as needed.   Melatonin 3 MG CAPS Take 10 mg by mouth at bedtime as needed (sleep).    metoprolol succinate (TOPROL-XL) 25 MG 24 hr tablet TAKE 1/2 TABLET(12.5 MG) BY MOUTH DAILY   montelukast (SINGULAIR) 10 MG tablet TAKE 1 TABLET(10 MG) BY MOUTH AT BEDTIME   SEROQUEL 100 MG tablet Take 100 mg by mouth at bedtime.   Facility-Administered Encounter Medications as of 09/16/2019  Medication   albuterol (PROVENTIL) (2.5 MG/3ML) 0.083% nebulizer solution 2.5 mg     Objective:  BP Readings from Last 3 Encounters:  09/14/19 122/62  08/17/19 120/60  07/09/19 (!) 119/52    Goals Addressed            This Visit's Progress    RNCM: I have a lot of health issues going on       [place] (see longtitudinal plan of care for additional care plan information)  Current Barriers:   Chronic Disease Management support, education, and care coordination needs related to HTN, COPD, Depression, and Bilateral leg swelling   Financial barriers  Mobility barriers   Clinical Goal(s) related to HTN, COPD, Depression, and bilateral leg swelling :  Over the next 120 days, patient will:   Work with the care management team to address educational, disease management, and care coordination needs   Begin or continue self health monitoring activities as directed today Measure and record blood pressure 5 times per week, Measure and record weight daily, and wear  compression stockings to help with bilateral leg swelling   Call provider office for new or worsened signs and symptoms Blood pressure findings outside established parameters, Weight outside established parameters, Shortness of breath, and New or worsened symptom related to bilateral leg swelling or depression  Call care management team with questions or concerns  Verbalize basic understanding of patient centered plan of care established today  Interventions related to HTN, COPD, Depression, and  Bilateral lower leg swelling :   Evaluation of current treatment plans and patient's adherence to plan as established by provider.  The patient is currently seeing cardiology and having testing done to determine if her heart is the reason for the swelling she is having and the shortness of breath. The patient recently had a stress test and and ECHO and her EF% is 60-65%  Assessed patient understanding of disease states.  09-16-2019: The patient states she saw the pulmonary provider recently and they have started her on a new inhaler. She has not gone to pick it up yet but will today. She is hoping this with help with her COPD and the episodes of shortness of breath she is having. She states her shortness of breath is about the same. Endorses using one pillow at night doubled up when sleeping. States she is better when it is not so hot outside.    Assessed patient's education and care coordination needs.  The patient verbalized she is using compression hose and that is helping with the swelling. The patient says she still has some but not as much. The patient advised to elevate her legs when sitting down. The patient admits she does not always do this but will try to start remembering to do this. 09-16-2019: Evaluation of leg swelling. The patient states that she is still having issues with this. Does not wear the compression stockings much during the summer, states: "It is too hot". Education given to wear when possible to help with circulation and swelling. The patient also reminded to elevate her legs when sitting.  Review of getting appointment rescheduled with the cardiologist and the patient has not done this yet. Review of the benefits of seeing cardiologist is support and management of edema and other heart conditions. The patient verbalized understanding. The patient does have an appointment with the kidney specialist on 11-09-2019. The patient states the swelling is about the same. She can not always  elevate her legs but does when she can. Reminded the patient the importance of follow up with providers to keep symptoms managed.   Provided the patient with contact information for the CCM team.   Assessed for device needs. The patient does not have a blood pressure cuff or a scale to monitor blood pressure or weights. Review of resources available. Completed- the patient now has a blood pressure cuff and scale and is using daily. Today's weight is 157.6 and her blood pressure with pulse is 100/56.  Education on orthostatic hypotension and to change position slowly. 07-22-2019: The patient is checking her blood pressure regularly. States today was 131/69. Weight is stable. 09-16-2019: The patient continues to check her blood pressures and they are WNL. The patients weight is stable. She is adhering to dietary restrictions and medications.   Provided disease specific education to patient: review of wearing of compression stockings (currently the patient is using compression stockings on bilateral legs.  Assessment reveals that the patient does not have any weeping from the legs or sores), elevation of feet and legs when sitting,  the benefit of using blood pressure cuff to take daily readings- the patient is now checking blood pressure daily and recording- Today's is 100/56, and the benefit of using a scale to record daily weights- patient is doing daily weights and today is 157.6. Review of notification to the pcp/card for weight gain or loss +/- 3 pounds in one day or +/- 5 pounds in one week. The patient verbalized understanding. 07-22-2019: Review and support given to the patient.   Collaborated with appropriate clinical care team members regarding patient needs: Pharmacy referral in place, LCSW referral in place. The CCM team to assist with meeting the patients health and wellness needs and goals by working with the pcp and interdisciplinary team.  Evaluation of pulmonary status. The patient states that  she has an appointment with the pulmonary provider on 08-17-2019.  Encouraged the patient to keep this appointment. The patient states she is doing well since seeing pcp and denies any new concerns. 09-16-2019: The patient saw the pulmonary provider this week and had a change in her inhaler. Will continue to monitor.   Review of appointment for 07-27-2019- the patient thought she had an appointment with the pcp, reviewed and the patient has AWV appointment scheduled via phone. Education and support given. The patient does not have any upcoming appointments with the pcp. Sees the urologist on 11-09-2019. Knows to call the office for changes or questions.   Patient Self Care Activities related to HTN, COPD, Depression, and bilateral leg swelling :   Patient is unable to independently self-manage chronic health conditions  Please see past updates related to this goal by clicking on the "Past Updates" button in the selected goal          Plan:   Telephone follow up appointment with care management team member scheduled for: 11-11-2019 at 10:15 am   [place], MSN, [place] [facility] Mobile: [phone]

## 2019-09-16 NOTE — Patient Instructions (Signed)
Visit Information  Goals Addressed            This Visit's Progress    RNCM: I have a lot of health issues going on       [place] (see longtitudinal plan of care for additional care plan information)  Current Barriers:   Chronic Disease Management support, education, and care coordination needs related to HTN, COPD, Depression, and Bilateral leg swelling   Financial barriers  Mobility barriers   Clinical Goal(s) related to HTN, COPD, Depression, and bilateral leg swelling :  Over the next 120 days, patient will:   Work with the care management team to address educational, disease management, and care coordination needs   Begin or continue self health monitoring activities as directed today Measure and record blood pressure 5 times per week, Measure and record weight daily, and wear compression stockings to help with bilateral leg swelling   Call provider office for new or worsened signs and symptoms Blood pressure findings outside established parameters, Weight outside established parameters, Shortness of breath, and New or worsened symptom related to bilateral leg swelling or depression  Call care management team with questions or concerns  Verbalize basic understanding of patient centered plan of care established today  Interventions related to HTN, COPD, Depression, and Bilateral lower leg swelling :   Evaluation of current treatment plans and patient's adherence to plan as established by provider.  The patient is currently seeing cardiology and having testing done to determine if her heart is the reason for the swelling she is having and the shortness of breath. The patient recently had a stress test and and ECHO and her EF% is 60-65%  Assessed patient understanding of disease states.  09-16-2019: The patient states she saw the pulmonary provider recently and they have started her on a new inhaler. She has not gone to pick it up yet but will today. She is hoping this  with help with her COPD and the episodes of shortness of breath she is having. She states her shortness of breath is about the same. Endorses using one pillow at night doubled up when sleeping. States she is better when it is not so hot outside.    Assessed patient's education and care coordination needs.  The patient verbalized she is using compression hose and that is helping with the swelling. The patient says she still has some but not as much. The patient advised to elevate her legs when sitting down. The patient admits she does not always do this but will try to start remembering to do this. 09-16-2019: Evaluation of leg swelling. The patient states that she is still having issues with this. Does not wear the compression stockings much during the summer, states: "It is too hot". Education given to wear when possible to help with circulation and swelling. The patient also reminded to elevate her legs when sitting.  Review of getting appointment rescheduled with the cardiologist and the patient has not done this yet. Review of the benefits of seeing cardiologist is support and management of edema and other heart conditions. The patient verbalized understanding. The patient does have an appointment with the kidney specialist on 11-09-2019. The patient states the swelling is about the same. She can not always elevate her legs but does when she can. Reminded the patient the importance of follow up with providers to keep symptoms managed.   Provided the patient with contact information for the CCM team.   Assessed for device needs. The  patient does not have a blood pressure cuff or a scale to monitor blood pressure or weights. Review of resources available. Completed- the patient now has a blood pressure cuff and scale and is using daily. Today's weight is 157.6 and her blood pressure with pulse is 100/56.  Education on orthostatic hypotension and to change position slowly. 07-22-2019: The patient is checking  her blood pressure regularly. States today was 131/69. Weight is stable. 09-16-2019: The patient continues to check her blood pressures and they are WNL. The patients weight is stable. She is adhering to dietary restrictions and medications.   Provided disease specific education to patient: review of wearing of compression stockings (currently the patient is using compression stockings on bilateral legs.  Assessment reveals that the patient does not have any weeping from the legs or sores), elevation of feet and legs when sitting, the benefit of using blood pressure cuff to take daily readings- the patient is now checking blood pressure daily and recording- Today's is 100/56, and the benefit of using a scale to record daily weights- patient is doing daily weights and today is 157.6. Review of notification to the pcp/card for weight gain or loss +/- 3 pounds in one day or +/- 5 pounds in one week. The patient verbalized understanding. 07-22-2019: Review and support given to the patient.   Collaborated with appropriate clinical care team members regarding patient needs: Pharmacy referral in place, LCSW referral in place. The CCM team to assist with meeting the patients health and wellness needs and goals by working with the pcp and interdisciplinary team.  Evaluation of pulmonary status. The patient states that she has an appointment with the pulmonary provider on 08-17-2019.  Encouraged the patient to keep this appointment. The patient states she is doing well since seeing pcp and denies any new concerns. 09-16-2019: The patient saw the pulmonary provider this week and had a change in her inhaler. Will continue to monitor.   Review of appointment for 07-27-2019- the patient thought she had an appointment with the pcp, reviewed and the patient has AWV appointment scheduled via phone. Education and support given. The patient does not have any upcoming appointments with the pcp. Sees the urologist on 11-09-2019.  Knows to call the office for changes or questions.   Patient Self Care Activities related to HTN, COPD, Depression, and bilateral leg swelling :   Patient is unable to independently self-manage chronic health conditions  Please see past updates related to this goal by clicking on the "Past Updates" button in the selected goal         Patient verbalizes understanding of instructions provided today.   Telephone follow up appointment with care management team member scheduled for: 11-11-2019 at 10:15  [place], MSN, [place] Mobile: [phone]

## 2019-09-17 ENCOUNTER — Telehealth: Payer: Self-pay | Admitting: *Deleted

## 2019-09-17 ENCOUNTER — Encounter: Payer: Self-pay | Admitting: *Deleted

## 2019-09-17 ENCOUNTER — Other Ambulatory Visit: Payer: Self-pay | Admitting: *Deleted

## 2019-09-17 DIAGNOSIS — Z122 Encounter for screening for malignant neoplasm of respiratory organs: Secondary | ICD-10-CM

## 2019-09-17 DIAGNOSIS — Z87891 Personal history of nicotine dependence: Secondary | ICD-10-CM

## 2019-09-17 NOTE — Telephone Encounter (Signed)
Received referral for initial lung cancer screening scan. Contacted patient and obtained smoking history,(current, 85.5 pack year) as well as answering questions related to screening process. Patient denies signs of lung cancer such as weight loss or hemoptysis. Patient denies comorbidity that would prevent curative treatment if lung cancer were found. Patient is scheduled for shared decision making visit and CT scan on 09/29/19 at 1030am.

## 2019-09-27 ENCOUNTER — Other Ambulatory Visit: Payer: Self-pay | Admitting: Family Medicine

## 2019-09-27 DIAGNOSIS — I69359 Hemiplegia and hemiparesis following cerebral infarction affecting unspecified side: Secondary | ICD-10-CM

## 2019-09-27 DIAGNOSIS — E785 Hyperlipidemia, unspecified: Secondary | ICD-10-CM

## 2019-09-27 NOTE — Telephone Encounter (Signed)
Requested medication (s) are due for refill today - yes  Requested medication (s) are on the active medication list -yes  Future visit scheduled -no  Last refill: 07/06/19  Notes to clinic: Patient fails all lab protocol- sent for review  Requested Prescriptions  Pending Prescriptions Disp Refills   atorvastatin (LIPITOR) 40 MG tablet [Pharmacy Med Name: ATORVASTATIN 40MG  TABLETS] 90 tablet 0    Sig: TAKE 1 TABLET BY MOUTH EVERY DAY      Cardiovascular:  Antilipid - Statins Failed - 09/27/2019  2:48 PM      Failed - Total Cholesterol in normal range and within 360 days    Cholesterol, Total  Date Value Ref Range Status  03/23/2015 118 100 - 199 mg/dL Final   Cholesterol  Date Value Ref Range Status  04/10/2018 135 <200 mg/dL Final  12/25/2012 185 0 - 200 mg/dL Final          Failed - LDL in normal range and within 360 days    Ldl Cholesterol, Calc  Date Value Ref Range Status  12/25/2012 127 (H) 0 - 100 mg/dL Final   LDL Cholesterol (Calc)  Date Value Ref Range Status  04/10/2018 64 mg/dL (calc) Final    Comment:    Reference range: <100 . Desirable range <100 mg/dL for primary prevention;   <70 mg/dL for patients with CHD or diabetic patients  with > or = 2 CHD risk factors. Marland Kitchen LDL-C is now calculated using the Martin-Hopkins  calculation, which is a validated novel method providing  better accuracy than the Friedewald equation in the  estimation of LDL-C.  Cresenciano Genre et al. Annamaria Helling. 2694;854(62): 2061-2068  (http://education.[facility].com/faq/FAQ164)           Failed - HDL in normal range and within 360 days    HDL Cholesterol  Date Value Ref Range Status  12/25/2012 39 (L) 40 - 60 mg/dL Final   HDL  Date Value Ref Range Status  04/10/2018 59 > OR = 50 mg/dL Final  03/23/2015 44 >39 mg/dL Final          Failed - Triglycerides in normal range and within 360 days    Triglycerides  Date Value Ref Range Status  04/10/2018 50 <150 mg/dL Final   12/25/2012 93 0 - 200 mg/dL Final          Passed - Patient is not pregnant      Passed - Valid encounter within last 12 months    Recent Outpatient Visits           2 months ago Benign hypertension   [facility], Lupita Raider, FNP   6 months ago Leg swelling   [facility], Lupita Raider, FNP   6 months ago Leg swelling   [facility], Lupita Raider, FNP   8 months ago COPD with acute exacerbation [facility])   [place], DO   11 months ago Perennial allergic rhinitis with seasonal variation   [place], DO       Future Appointments             In 2 days Quay Burow Wandra Feinstein, NP [facility]                Requested Prescriptions  Pending Prescriptions Disp Refills   atorvastatin (LIPITOR) 40 MG tablet [Pharmacy Med Name: ATORVASTATIN 40MG  TABLETS] 90 tablet 0  Sig: TAKE 1 TABLET BY MOUTH EVERY DAY      Cardiovascular:  Antilipid - Statins Failed - 09/27/2019  2:48 PM      Failed - Total Cholesterol in normal range and within 360 days    Cholesterol, Total  Date Value Ref Range Status  03/23/2015 118 100 - 199 mg/dL Final   Cholesterol  Date Value Ref Range Status  04/10/2018 135 <200 mg/dL Final  12/25/2012 185 0 - 200 mg/dL Final          Failed - LDL in normal range and within 360 days    Ldl Cholesterol, Calc  Date Value Ref Range Status  12/25/2012 127 (H) 0 - 100 mg/dL Final   LDL Cholesterol (Calc)  Date Value Ref Range Status  04/10/2018 64 mg/dL (calc) Final    Comment:    Reference range: <100 . Desirable range <100 mg/dL for primary prevention;   <70 mg/dL for patients with CHD or diabetic patients  with > or = 2 CHD risk factors. Marland Kitchen LDL-C is now calculated using the Martin-Hopkins  calculation, which is a validated novel method providing  better accuracy than the  Friedewald equation in the  estimation of LDL-C.  Cresenciano Genre et al. Annamaria Helling. 4709;295(74): 2061-2068  (http://education.[facility].com/faq/FAQ164)           Failed - HDL in normal range and within 360 days    HDL Cholesterol  Date Value Ref Range Status  12/25/2012 39 (L) 40 - 60 mg/dL Final   HDL  Date Value Ref Range Status  04/10/2018 59 > OR = 50 mg/dL Final  03/23/2015 44 >39 mg/dL Final          Failed - Triglycerides in normal range and within 360 days    Triglycerides  Date Value Ref Range Status  04/10/2018 50 <150 mg/dL Final  12/25/2012 93 0 - 200 mg/dL Final          Passed - Patient is not pregnant      Passed - Valid encounter within last 12 months    Recent Outpatient Visits           2 months ago Benign hypertension   [facility], Lupita Raider, FNP   6 months ago Leg swelling   [facility], Lupita Raider, FNP   6 months ago Leg swelling   [facility], Lupita Raider, FNP   8 months ago COPD with acute exacerbation [facility])   [place], DO   11 months ago Perennial allergic rhinitis with seasonal variation   [place], DO       Future Appointments             In 2 days Quay Burow, Wandra Feinstein, NP [facility]

## 2019-09-28 ENCOUNTER — Other Ambulatory Visit: Payer: Self-pay | Admitting: Family Medicine

## 2019-09-28 DIAGNOSIS — J302 Other seasonal allergic rhinitis: Secondary | ICD-10-CM

## 2019-09-28 DIAGNOSIS — J3089 Other allergic rhinitis: Secondary | ICD-10-CM

## 2019-09-28 MED ORDER — MONTELUKAST SODIUM 10 MG PO TABS: ORAL_TABLET | ORAL | 0 refills | Status: DC

## 2019-09-28 NOTE — Telephone Encounter (Signed)
Medication Refill - Medication: montelukast (SINGULAIR) 10 MG tablet    Has the patient contacted their pharmacy? yes (Agent: If no, request that the patient contact the pharmacy for the refill.) (Agent: If yes, when and what did the pharmacy advise?)contact pcp  Preferred Pharmacy (with phone number or street name):  [facility] [place], [address] Phone:  [phone]  Fax:  [phone]       Agent: Please be advised that RX refills may take up to 3 business days. We ask that you follow-up with your pharmacy.

## 2019-09-29 ENCOUNTER — Ambulatory Visit
Admission: RE | Admit: 2019-09-29 | Discharge: 2019-09-29 | Disposition: A | Discharge status: Home / Self Care | Payer: Medicare HMO | Source: Ambulatory Visit | Attending: Oncology | Admitting: Oncology

## 2019-09-29 ENCOUNTER — Inpatient Hospital Stay: Payer: Medicare HMO | Attending: Oncology | Admitting: Oncology

## 2019-09-29 ENCOUNTER — Other Ambulatory Visit: Payer: Self-pay

## 2019-09-29 DIAGNOSIS — Z87891 Personal history of nicotine dependence: Secondary | ICD-10-CM

## 2019-09-29 DIAGNOSIS — Z122 Encounter for screening for malignant neoplasm of respiratory organs: Secondary | ICD-10-CM | POA: Diagnosis not present

## 2019-09-29 DIAGNOSIS — R69 Illness, unspecified: Secondary | ICD-10-CM | POA: Diagnosis not present

## 2019-09-29 DIAGNOSIS — F17210 Nicotine dependence, cigarettes, uncomplicated: Secondary | ICD-10-CM

## 2019-09-29 NOTE — Progress Notes (Signed)
Virtual Visit via Video Note  I connected with Beth [name] on 09/29/19 at 10:30 AM EDT by a video enabled telemedicine application and verified that I am speaking with the correct person using two identifiers.  Location: Patient: [facility] Provider: Clinic    I discussed the limitations of evaluation and management by telemedicine and the availability of in person appointments. The patient expressed understanding and agreed to proceed.  I discussed the assessment and treatment plan with the patient. The patient was provided an opportunity to ask questions and all were answered. The patient agreed with the plan and demonstrated an understanding of the instructions.   The patient was advised to call back or seek an in-person evaluation if the symptoms worsen or if the condition fails to improve as anticipated.   In accordance with CMS guidelines, patient has met eligibility criteria including age, absence of signs or symptoms of lung cancer.  Social History   Tobacco Use  . Smoking status: Current Every Day Smoker    Packs/day: 1.50    Years: 57.00    Pack years: 85.50    Types: Cigarettes  . Smokeless tobacco: Never Used  . Tobacco comment: .5ppd currently  Vaping Use  . Vaping Use: Never used  Substance Use Topics  . Alcohol use: No    Alcohol/week: 0.0 standard drinks  . Drug use: No      A shared decision-making session was conducted prior to the performance of CT scan. This includes one or more decision aids, includes benefits and harms of screening, follow-up diagnostic testing, over-diagnosis, false positive rate, and total radiation exposure.   Counseling on the importance of adherence to annual lung cancer LDCT screening, impact of co-morbidities, and ability or willingness to undergo diagnosis and treatment is imperative for compliance of the program.   Counseling on the importance of continued smoking cessation for former smokers; the importance of smoking cessation for  current smokers, and information about tobacco cessation interventions have been given to patient including [place] and [facility] quit [place] programs.   Written order for lung cancer screening with LDCT has been given to the patient and any and all questions have been answered to the best of my abilities.    Yearly follow up will be coordinated by Burgess Estelle, Thoracic Navigator.  I provided 15 minutes of face-to-face video visit time during this encounter, and > 50% was spent counseling as documented under my assessment & plan.   Jacquelin Hawking, NP

## 2019-09-30 ENCOUNTER — Telehealth: Payer: Self-pay | Admitting: *Deleted

## 2019-09-30 NOTE — Telephone Encounter (Signed)
Results and recommendations from LDCT reviewed with patient. Informed that will need follow up CT scan in 6 months. Pt verbalized understanding. Nothing further needed at this time.

## 2019-10-06 ENCOUNTER — Ambulatory Visit: Payer: Self-pay | Admitting: Licensed Clinical Social Worker

## 2019-10-06 ENCOUNTER — Ambulatory Visit: Payer: Self-pay

## 2019-10-06 DIAGNOSIS — F332 Major depressive disorder, recurrent severe without psychotic features: Secondary | ICD-10-CM

## 2019-10-06 DIAGNOSIS — I10 Essential (primary) hypertension: Secondary | ICD-10-CM

## 2019-10-06 DIAGNOSIS — J449 Chronic obstructive pulmonary disease, unspecified: Secondary | ICD-10-CM

## 2019-10-06 DIAGNOSIS — F411 Generalized anxiety disorder: Secondary | ICD-10-CM

## 2019-10-06 NOTE — Chronic Care Management (AMB) (Signed)
Erroneous encounter created on 10/06/19

## 2019-10-06 NOTE — Chronic Care Management (AMB) (Signed)
Chronic Care Management    Clinical Social Work Follow Up Note  10/06/2019 Name: Beth [name] MRN: [id number] DOB: 11/01/1945  Beth [name] is a 74 y.o. year old female who is a primary care patient of Verl Bangs, FNP. The CCM team was consulted for assistance with Mental Health Counseling and Resources.   Review of patient status, including review of consultants reports, other relevant assessments, and collaboration with appropriate care team members and the patient's provider was performed as part of comprehensive patient evaluation and provision of chronic care management services.    SDOH (Social Determinants of Health) assessments performed: Yes    Outpatient Encounter Medications as of 10/06/2019  Medication Sig  . acetaminophen (TYLENOL) 500 MG tablet Take 500 mg by mouth 2 (two) times daily as needed.   Marland Kitchen aspirin 81 MG chewable tablet Chew 81 mg daily by mouth.  Marland Kitchen atorvastatin (LIPITOR) 40 MG tablet TAKE 1 TABLET BY MOUTH EVERY DAY  . azelastine (ASTELIN) 0.1 % nasal spray Place 2 sprays into both nostrils 2 (two) times daily.  Marland Kitchen BREO ELLIPTA 100-25 MCG/INH AEPB INHALE 1 PUFF INTO THE LUNGS DAILY  . clonazePAM (KLONOPIN) 0.5 MG tablet TK 1 T PO DAILY PRN  . COMBIVENT RESPIMAT 20-100 MCG/ACT AERS respimat INHALE 1 PUFF BY MOUTH EVERY 6 HOURS  . FLUoxetine (PROZAC) 20 MG tablet Take 60 mg by mouth daily.   . Fluticasone-Umeclidin-Vilant (TRELEGY ELLIPTA) 200-62.5-25 MCG/INH AEPB Inhale 1 puff into the lungs daily.  . furosemide (LASIX) 20 MG tablet Take 1 tablet (20 mg total) by mouth daily.  Marland Kitchen gabapentin (NEURONTIN) 300 MG capsule Take 2 capsules (600 mg total) by mouth 3 (three) times daily.  . hydrOXYzine (ATARAX/VISTARIL) 25 MG tablet Take 25-50 mg by mouth daily as needed.  . Melatonin 3 MG CAPS Take 10 mg by mouth at bedtime as needed (sleep).   . metoprolol succinate (TOPROL-XL) 25 MG 24 hr tablet TAKE 1/2 TABLET(12.5 MG) BY MOUTH DAILY  . montelukast (SINGULAIR)  10 MG tablet TAKE 1 TABLET(10 MG) BY MOUTH AT BEDTIME  . SEROQUEL 100 MG tablet Take 100 mg by mouth at bedtime.   Facility-Administered Encounter Medications as of 10/06/2019  Medication  . albuterol (PROVENTIL) (2.5 MG/3ML) 0.083% nebulizer solution 2.5 mg     Goals Addressed    .  SW: I'm doing better now with my stress (pt-stated)        Current Barriers:  . Chronic Mental Health needs related to depression . Financial constraints related to managing health care expenses . Limited social support . ADL IADL limitations . Limited access to caregiver . Inability to perform ADL's independently . Suicidal Ideation/Homicidal Ideation: No  Clinical Social Work Goal(s):  Marland Kitchen Over the next 120 days, patient will work with SW  bi-monthly  by telephone or in person to reduce or manage symptoms related to depression . Over the next 120 days, patient will work with SW to address concerns related to decreasing depression and increasing self-care  Interventions: . Patient interviewed and appropriate assessments performed: brief mental health assessment . Provided mental health counseling with regard to depression. Patient reports managing her mental health well at this time. LCSW educated patient on coping methods to implement into her daily life to combat depressive symptoms and stress. Patient denied any suicidal or homicidal ideations. Encouraged patient to implement deep breathing exercises into her daily routine due to ongoing stress. Patient reports that she is actively working on both her physical and mental  health. She shares that her legs continue to swell even though she is now on Lasix. Patient reports that she is using her compression stockings during the day and elevating her legs at night as needed. Patient is still hopeful for some relief from the swelling.  . Patient reports no needing mental health resource education on connection at this time but will inform PCP or CCM LCSW if she  changes her mind. . Discussed plans with patient for ongoing care management follow up and provided patient with direct contact information for care management team . Provided education and assistance to client regarding Advanced Directives. . A voluntary and extensive discussion about advanced care planning including explanation and discussion of advanced was undertaken with the patient. Explanation regarding healthcare proxy and living will was reviewed and packet with forms with explanation of how to fill them out was given.   . Provided education to patient/caregiver about Hospice and/or Palliative Care services . Mindfulness or Relaxation Training provided during session for patient to implement when faced with future triggers . Patient reports implementing appropriate self-care by getting up each day and walking outside to get some fresh air. LCSW provided positive reinforcement for this accomplishment. . Patient denies any issues with her sleep routine. She reports that she takes a melatonin vitamin every night which helps her to unwind from the day.    Patient Self Care Activities:  . Calls provider office for new concerns or questions . Ability for insight . Motivation for treatment  Patient Coping Strengths:  . Hopefulness . Self Advocate . Able to Communicate Effectively  Patient Self Care Deficits:  . Lacks social connections  Please see past updates related to this goal by clicking on the "Past Updates" button in the selected goal       Follow Up Plan: SW will follow up with patient by phone over the next quarter  Eula Fried, [place], MSW, [email or web address] Phone: [phone]

## 2019-10-15 NOTE — Progress Notes (Signed)
Agree with the details of the procedure as noted by Tammy Parrett, NP.  C. Laura Marcello Tuzzolino, MD [facility] 

## 2019-10-18 ENCOUNTER — Other Ambulatory Visit: Payer: Self-pay | Admitting: Family Medicine

## 2019-10-18 DIAGNOSIS — I69359 Hemiplegia and hemiparesis following cerebral infarction affecting unspecified side: Secondary | ICD-10-CM

## 2019-10-18 DIAGNOSIS — E785 Hyperlipidemia, unspecified: Secondary | ICD-10-CM

## 2019-10-18 NOTE — Telephone Encounter (Signed)
Requested medication (s) are due for refill today: Yes  Requested medication (s) are on the active medication list: Yes  Last refill:  07/06/19  Future visit scheduled: No  Notes to clinic:  Labs due.    Requested Prescriptions  Pending Prescriptions Disp Refills   atorvastatin (LIPITOR) 40 MG tablet [Pharmacy Med Name: ATORVASTATIN 40MG  TABLETS] 90 tablet 0    Sig: TAKE 1 TABLET BY MOUTH EVERY DAY      Cardiovascular:  Antilipid - Statins Failed - 10/18/2019  9:51 AM      Failed - Total Cholesterol in normal range and within 360 days    Cholesterol, Total  Date Value Ref Range Status  03/23/2015 118 100 - 199 mg/dL Final   Cholesterol  Date Value Ref Range Status  04/10/2018 135 <200 mg/dL Final  12/25/2012 185 0 - 200 mg/dL Final          Failed - LDL in normal range and within 360 days    Ldl Cholesterol, Calc  Date Value Ref Range Status  12/25/2012 127 (H) 0 - 100 mg/dL Final   LDL Cholesterol (Calc)  Date Value Ref Range Status  04/10/2018 64 mg/dL (calc) Final    Comment:    Reference range: <100 . Desirable range <100 mg/dL for primary prevention;   <70 mg/dL for patients with CHD or diabetic patients  with > or = 2 CHD risk factors. Marland Kitchen LDL-C is now calculated using the Martin-Hopkins  calculation, which is a validated novel method providing  better accuracy than the Friedewald equation in the  estimation of LDL-C.  Cresenciano Genre et al. Annamaria Helling. 7654;650(35): 2061-2068  (http://education.[employer].com/faq/FAQ164)           Failed - HDL in normal range and within 360 days    HDL Cholesterol  Date Value Ref Range Status  12/25/2012 39 (L) 40 - 60 mg/dL Final   HDL  Date Value Ref Range Status  04/10/2018 59 > OR = 50 mg/dL Final  03/23/2015 44 >39 mg/dL Final          Failed - Triglycerides in normal range and within 360 days    Triglycerides  Date Value Ref Range Status  04/10/2018 50 <150 mg/dL Final  12/25/2012 93 0 - 200 mg/dL Final           Passed - Patient is not pregnant      Passed - Valid encounter within last 12 months    Recent Outpatient Visits           3 months ago Benign hypertension   [facility], Lupita Raider, FNP   6 months ago Leg swelling   [facility], Lupita Raider, FNP   7 months ago Leg swelling   [facility], Lupita Raider, FNP   9 months ago COPD with acute exacerbation [facility])   [place], DO   1 year ago Perennial allergic rhinitis with seasonal variation   [place], [name], DO

## 2019-11-09 ENCOUNTER — Other Ambulatory Visit: Payer: Self-pay | Admitting: Nephrology

## 2019-11-09 DIAGNOSIS — N179 Acute kidney failure, unspecified: Secondary | ICD-10-CM | POA: Diagnosis not present

## 2019-11-09 DIAGNOSIS — R600 Localized edema: Secondary | ICD-10-CM | POA: Diagnosis not present

## 2019-11-09 DIAGNOSIS — R8290 Unspecified abnormal findings in urine: Secondary | ICD-10-CM | POA: Diagnosis not present

## 2019-11-09 DIAGNOSIS — N1832 Chronic kidney disease, stage 3b: Secondary | ICD-10-CM | POA: Diagnosis not present

## 2019-11-09 DIAGNOSIS — R809 Proteinuria, unspecified: Secondary | ICD-10-CM | POA: Diagnosis not present

## 2019-11-11 ENCOUNTER — Telehealth: Payer: Self-pay

## 2019-11-11 ENCOUNTER — Telehealth: Payer: Self-pay | Admitting: General Practice

## 2019-11-11 DIAGNOSIS — R69 Illness, unspecified: Secondary | ICD-10-CM | POA: Diagnosis not present

## 2019-11-11 DIAGNOSIS — F332 Major depressive disorder, recurrent severe without psychotic features: Secondary | ICD-10-CM | POA: Diagnosis not present

## 2019-11-11 NOTE — Telephone Encounter (Signed)
  Chronic Care Management   Outreach Note  11/11/2019 Name: Beth [name] MRN: [id number] DOB: 03-24-1945  Referred by: Verl Bangs, FNP Reason for referral : Chronic Care Management (RNCM follow up call for Chronic Disease Management and Care Coordination Needs)   An unsuccessful telephone outreach was attempted today. The patient was referred to the case management team for assistance with care management and care coordination.   Follow Up Plan: A HIPAA compliant phone message was left for the patient providing contact information and requesting a return call.   [name] RN, MSN, [place] Mobile: [phone]

## 2019-11-15 ENCOUNTER — Ambulatory Visit (INDEPENDENT_AMBULATORY_CARE_PROVIDER_SITE_OTHER): Payer: Medicare HMO | Admitting: Pharmacist

## 2019-11-15 DIAGNOSIS — I10 Essential (primary) hypertension: Secondary | ICD-10-CM

## 2019-11-15 DIAGNOSIS — J449 Chronic obstructive pulmonary disease, unspecified: Secondary | ICD-10-CM | POA: Diagnosis not present

## 2019-11-15 NOTE — Patient Instructions (Signed)
Thank you allowing the Chronic Care Management Team to be a part of your care! It was a pleasure speaking with you today!     CCM (Chronic Care Management) Team    Pam Tate RN, MSN, CCM Nurse Care Coordinator  [phone]     PharmD  Clinical Pharmacist  [phone]   Brooke Joyce LCSW Clinical Social Worker [phone]  Visit Information  Goals Addressed            This Visit's Progress   . PharmD - Medication Management       CARE PLAN ENTRY (see longtitudinal plan of care for additional care plan information)   Current Barriers:  . Chronic Disease Management support, education, and care coordination needs related to previous CVA, COPD, hypertension, hyperlipidemia, depression  Pharmacist Clinical Goal(s):  . Over the next 30 days, patient will work with CM Pharmacist to complete medication review address needs identified  Interventions: . Perform chart review ? Patient seen by Pulmonologist on 8/19 ? Maintenance inhaler therapy switched from Breo to Trelegy ? Low-dose CT screening completed on 9/1 ? Patient seen by Nephrology on 10/12 ? Patient advised to obtain renal ultrasound ? Creatinine 0.98 mg/dL; eGFR ~56 (from 10/12 shared record) . Counsel on importance of blood pressure monitoring and control ? Reports taking: ? Furosemide 20 mg daily ? Metoprolol ER 25 mg - 1/2 tablet (12.5 mg ) daily ? Note patient has home upper BP monitor ? Reports monitoring home BP as directed ? Recalls last checked a couple of days ago, BP ~127/72 ? Encourage patient to continue to monitor, keep log of BP and bring record to medical appointments ? Reports continues to have swelling in legs. Confirms was evaluated by Nephrologist at latest visit ? Confirms has compression socks, but denies wearing.  ? Encourage patient to wear compression socks to aid with leg edema . Counsel on importance of medication adherence.  ? Reports that she obtained and  started using weekly pillbox as adherence aid ? Denies any missed doses ? Confirms started using Trelegy inhaler, 1 puff daily, as directed and has stopped Breo. Confirms continuing to use rescue (Combivent) inhaler as needed as directed ? Confirms rinsing out mouth after each use of Trelegy . Counsel on benefits of smoking cessation ? Reports has cut back to ~3 cigarettes/day ? Reports has obtained nicotine patches (14mg/day strength). Counsel on using nicotine patch to aid with cessation attempt . Coordination of care - Note patient previously reported that she needed to establish care with a new psychiatrist ? Reports has received referral from previous psychiatrist in [place] for psychiatrist in [place] and planning ot reach out to schedule appointment .   Patient Self Care Activities:  . To self administers medications as prescribed o Encourage patient to obtain weekly pillbox to use as adherence tool . Attends all scheduled provider appointments . Calls pharmacy for medication refills . Calls provider office for new concerns or questions  Please see past updates related to this goal by clicking on the "Past Updates" button in the selected goal         Patient verbalizes understanding of instructions provided today.   Telephone follow up appointment with care management team member scheduled for: 11/15 at 9:45 am   , PharmD, BCACP Clinical Pharmacist [facility] [phone]  

## 2019-11-15 NOTE — Chronic Care Management (AMB) (Signed)
Chronic Care Management   Follow Up Note   11/15/2019 Name: Beth [name] MRN: [id number] DOB: 09-Feb-1945  Referred by: Verl Bangs, FNP Reason for referral : Chronic Care Management (Patient Phone Call)   Beth [name] is a 74 y.o. year old female who is a primary care patient of Lorine Bears, Lupita Raider, [place]. The CCM team was consulted for assistance with chronic disease management and care coordination needs.    I reached out to [name] by phone today.   Review of patient status, including review of consultants reports, relevant laboratory and other test results, and collaboration with appropriate care team members and the patient's provider was performed as part of comprehensive patient evaluation and provision of chronic care management services.    SDOH (Social Determinants of Health) assessments performed: No See Care Plan activities for detailed interventions related to [facility])     Outpatient Encounter Medications as of 11/15/2019  Medication Sig  . COMBIVENT RESPIMAT 20-100 MCG/ACT AERS respimat INHALE 1 PUFF BY MOUTH EVERY 6 HOURS  . Fluticasone-Umeclidin-Vilant (TRELEGY ELLIPTA) 200-62.5-25 MCG/INH AEPB Inhale 1 puff into the lungs daily.  . furosemide (LASIX) 20 MG tablet Take 1 tablet (20 mg total) by mouth daily.  . metoprolol succinate (TOPROL-XL) 25 MG 24 hr tablet TAKE 1/2 TABLET(12.5 MG) BY MOUTH DAILY  . acetaminophen (TYLENOL) 500 MG tablet Take 500 mg by mouth 2 (two) times daily as needed.   Marland Kitchen aspirin 81 MG chewable tablet Chew 81 mg daily by mouth.  Marland Kitchen atorvastatin (LIPITOR) 40 MG tablet TAKE 1 TABLET BY MOUTH EVERY DAY  . azelastine (ASTELIN) 0.1 % nasal spray Place 2 sprays into both nostrils 2 (two) times daily.  Marland Kitchen BREO ELLIPTA 100-25 MCG/INH AEPB INHALE 1 PUFF INTO THE LUNGS DAILY (Patient not taking: Reported on 11/15/2019)  . clonazePAM (KLONOPIN) 0.5 MG tablet TK 1 T PO DAILY PRN  . FLUoxetine (PROZAC) 20 MG tablet Take 60 mg by mouth daily.   Marland Kitchen  gabapentin (NEURONTIN) 300 MG capsule Take 2 capsules (600 mg total) by mouth 3 (three) times daily.  . hydrOXYzine (ATARAX/VISTARIL) 25 MG tablet Take 25-50 mg by mouth daily as needed.  . Melatonin 3 MG CAPS Take 10 mg by mouth at bedtime as needed (sleep).   . montelukast (SINGULAIR) 10 MG tablet TAKE 1 TABLET(10 MG) BY MOUTH AT BEDTIME  . SEROQUEL 100 MG tablet Take 100 mg by mouth at bedtime.   Facility-Administered Encounter Medications as of 11/15/2019  Medication  . albuterol (PROVENTIL) (2.5 MG/3ML) 0.083% nebulizer solution 2.5 mg    Goals Addressed            This Visit's Progress   . PharmD - Medication Management       CARE PLAN ENTRY (see longtitudinal plan of care for additional care plan information)   Current Barriers:  . Chronic Disease Management support, education, and care coordination needs related to previous CVA, COPD, hypertension, hyperlipidemia, depression  Pharmacist Clinical Goal(s):  Marland Kitchen Over the next 30 days, patient will work with CM Pharmacist to complete medication review address needs identified  Interventions: . Perform chart review ? Patient seen by Pulmonologist on 8/19 ? Maintenance inhaler therapy switched from Breo to Trelegy ? Low-dose CT screening completed on 9/1 ? Patient seen by Nephrology on 10/12 ? Patient advised to obtain renal ultrasound ? Creatinine 0.98 mg/dL; eGFR ~56 (from 10/12 shared record) . Counsel on importance of blood pressure monitoring and control ? Reports taking: ? Furosemide  20 mg daily ? Metoprolol ER 25 mg - 1/2 tablet (12.5 mg ) daily ? Note patient has home upper BP monitor ? Reports monitoring home BP as directed ? Recalls last checked a couple of days ago, BP ~127/72 ? Encourage patient to continue to monitor, keep log of BP and bring record to medical appointments ? Reports continues to have swelling in legs. Confirms was evaluated by Nephrologist at latest visit ? Confirms has compression socks, but  denies wearing.  ? Encourage patient to wear compression socks to aid with leg edema . Counsel on importance of medication adherence.  ? Reports that she obtained and started using weekly pillbox as adherence aid ? Denies any missed doses ? Confirms started using Trelegy inhaler, 1 puff daily, as directed and has stopped Breo. Confirms continuing to use rescue (Combivent) inhaler as needed as directed ? Confirms rinsing out mouth after each use of Trelegy . Counsel on benefits of smoking cessation ? Reports has cut back to ~3 cigarettes/day ? Reports has obtained nicotine patches (69m/day strength). Counsel on using nicotine patch to aid with cessation attempt . Coordination of care - Note patient previously reported that she needed to establish care with a new psychiatrist ? [place] appointment .   Patient Self Care Activities:  . To self administers medications as prescribed o Encourage patient to obtain weekly pillbox to use as adherence tool . Attends all scheduled provider appointments . Calls pharmacy for medication refills . Calls provider office for new concerns or questions  Please see past updates related to this goal by clicking on the "Past Updates" button in the selected goal         Plan  Telephone follow up appointment with care management team member scheduled for: 11/15 at 9:45 am  EHarlow Asa PharmD, [phone]

## 2019-11-17 ENCOUNTER — Telehealth: Payer: Self-pay

## 2019-11-17 NOTE — Chronic Care Management (AMB) (Signed)
  Care Management   Note  11/17/2019 Name: DARRIONA DEHAAS MRN: [id number] DOB: 1945/02/05  Cresenciano Lick Jerome is a 74 y.o. year old female who is a primary care patient of Lorine Bears, Lupita Raider, FNP and is actively engaged with the care management team. I reached out to [name] by phone today to assist with re-scheduling a follow up visit with the RN Case Manager  Follow up plan: Unsuccessful telephone outreach attempt made. A HIPAA compliant phone message was left for the patient providing contact information and requesting a return call.  The care management team will reach out to the patient again over the next 7 days.  If patient returns call to provider office, please advise to call [place]  at [place], [place], [place], [place] [address] Direct Dial: [phone] [email or web address]: [email or web address]

## 2019-11-22 ENCOUNTER — Ambulatory Visit
Admission: RE | Admit: 2019-11-22 | Discharge: 2019-11-22 | Disposition: A | Discharge status: Home / Self Care | Payer: Medicare HMO | Source: Ambulatory Visit | Attending: Nephrology | Admitting: Nephrology

## 2019-11-22 ENCOUNTER — Other Ambulatory Visit: Payer: Self-pay

## 2019-11-22 DIAGNOSIS — N179 Acute kidney failure, unspecified: Secondary | ICD-10-CM | POA: Diagnosis not present

## 2019-11-30 ENCOUNTER — Other Ambulatory Visit: Payer: Self-pay | Admitting: Student in an Organized Health Care Education/Training Program

## 2019-12-01 NOTE — Chronic Care Management (AMB) (Signed)
  Care Management   Note  12/01/2019 Name: [name] MRN: [id number] DOB: 14-Jun-1945  Beth [name] is a 74 y.o. year old female who is a primary care patient of [place], Lupita Raider, FNP and is actively engaged with the care management team. I reached out to [name] by phone today to assist with re-scheduling a follow up visit with the RN Case Manager  Follow up plan: Telephone appointment with care management team member scheduled for:12/30/2019  [name], [place], [place], [place] [address] Direct Dial: [phone] [email or web address] Website: [email or web address]

## 2019-12-01 NOTE — Telephone Encounter (Signed)
Pt has been r/s  

## 2019-12-12 ENCOUNTER — Other Ambulatory Visit: Payer: Self-pay | Admitting: Family Medicine

## 2019-12-12 DIAGNOSIS — J302 Other seasonal allergic rhinitis: Secondary | ICD-10-CM

## 2019-12-12 DIAGNOSIS — J3089 Other allergic rhinitis: Secondary | ICD-10-CM

## 2019-12-12 NOTE — Telephone Encounter (Signed)
Requested Prescriptions  Pending Prescriptions Disp Refills  . montelukast (SINGULAIR) 10 MG tablet [Pharmacy Med Name: MONTELUKAST 10MG  TABLETS] 90 tablet 0    Sig: TAKE 1 TABLET(10 MG) BY MOUTH AT BEDTIME     Pulmonology:  Leukotriene Inhibitors Passed - 12/12/2019  1:53 PM      Passed - Valid encounter within last 12 months    Recent Outpatient Visits          5 months ago Benign hypertension   [place], FNP   8 months ago Leg swelling   [facility], Lupita Raider, FNP   8 months ago Leg swelling   [facility], Lupita Raider, [place]   11 months ago COPD with acute exacerbation [facility])   [place], DO   1 year ago Perennial allergic rhinitis with seasonal variation   [place], [name], [place]

## 2019-12-13 ENCOUNTER — Ambulatory Visit: Payer: Self-pay | Admitting: Pharmacist

## 2019-12-13 DIAGNOSIS — I10 Essential (primary) hypertension: Secondary | ICD-10-CM

## 2019-12-13 DIAGNOSIS — J449 Chronic obstructive pulmonary disease, unspecified: Secondary | ICD-10-CM

## 2019-12-13 DIAGNOSIS — N1831 Chronic kidney disease, stage 3a: Secondary | ICD-10-CM | POA: Diagnosis not present

## 2019-12-13 DIAGNOSIS — N179 Acute kidney failure, unspecified: Secondary | ICD-10-CM | POA: Diagnosis not present

## 2019-12-13 DIAGNOSIS — R600 Localized edema: Secondary | ICD-10-CM | POA: Diagnosis not present

## 2019-12-13 NOTE — Chronic Care Management (AMB) (Signed)
Chronic Care Management   Follow Up Note   12/13/2019 Name: ITZAYANA PARDY MRN: [id number] DOB: 03-31-1945  Referred by: Verl Bangs, FNP Reason for referral : Chronic Care Management (Patient Phone Call)   KILEIGH ORTMANN is a 74 y.o. year old female who is a primary care patient of Lorine Bears, Lupita Raider, [place]. The CCM team was consulted for assistance with chronic disease management and care coordination needs.    I reached out to [name] by phone today.   Review of patient status, including review of consultants reports, relevant laboratory and other test results, and collaboration with appropriate care team members and the patient's provider was performed as part of comprehensive patient evaluation and provision of chronic care management services.    SDOH (Social Determinants of Health) assessments performed: No See Care Plan activities for detailed interventions related to [facility])     Outpatient Encounter Medications as of 12/13/2019  Medication Sig  . acetaminophen (TYLENOL) 500 MG tablet Take 500 mg by mouth 2 (two) times daily as needed.   Marland Kitchen aspirin 81 MG chewable tablet Chew 81 mg daily by mouth.  Marland Kitchen atorvastatin (LIPITOR) 40 MG tablet TAKE 1 TABLET BY MOUTH EVERY DAY  . azelastine (ASTELIN) 0.1 % nasal spray Place 2 sprays into both nostrils 2 (two) times daily.  Marland Kitchen BREO ELLIPTA 100-25 MCG/INH AEPB INHALE 1 PUFF INTO THE LUNGS DAILY (Patient not taking: Reported on 11/15/2019)  . clonazePAM (KLONOPIN) 0.5 MG tablet TK 1 T PO DAILY PRN  . COMBIVENT RESPIMAT 20-100 MCG/ACT AERS respimat INHALE 1 PUFF BY MOUTH EVERY 6 HOURS  . FLUoxetine (PROZAC) 20 MG tablet Take 60 mg by mouth daily.   . Fluticasone-Umeclidin-Vilant (TRELEGY ELLIPTA) 200-62.5-25 MCG/INH AEPB Inhale 1 puff into the lungs daily.  . furosemide (LASIX) 20 MG tablet Take 1 tablet (20 mg total) by mouth daily.  Marland Kitchen gabapentin (NEURONTIN) 300 MG capsule Take 2 capsules (600 mg total) by mouth 3 (three) times  daily.  . hydrOXYzine (ATARAX/VISTARIL) 25 MG tablet Take 25-50 mg by mouth daily as needed.  . Melatonin 3 MG CAPS Take 10 mg by mouth at bedtime as needed (sleep).   . metoprolol succinate (TOPROL-XL) 25 MG 24 hr tablet TAKE 1/2 TABLET(12.5 MG) BY MOUTH DAILY  . montelukast (SINGULAIR) 10 MG tablet TAKE 1 TABLET(10 MG) BY MOUTH AT BEDTIME  . SEROQUEL 100 MG tablet Take 100 mg by mouth at bedtime.   Facility-Administered Encounter Medications as of 12/13/2019  Medication  . albuterol (PROVENTIL) (2.5 MG/3ML) 0.083% nebulizer solution 2.5 mg    Goals Addressed            This Visit's Progress   . PharmD - Medication Management       CARE PLAN ENTRY (see longtitudinal plan of care for additional care plan information)   Current Barriers:  . Chronic Disease Management support, education, and care coordination needs related to previous CVA, COPD, hypertension, hyperlipidemia, depression  Pharmacist Clinical Goal(s):  Marland Kitchen Over the next 30 days, patient will work with CM Pharmacist to complete medication review address needs identified  Interventions: . Counsel on importance of blood pressure monitoring and control ? Reports taking: ? Furosemide 20 mg daily ? Metoprolol ER 25 mg - 1/2 tablet (12.5 mg ) daily ? Note patient has home upper BP monitor ? Denies having checked home BP recently ? Encourage patient to monitor, keep log of BP and bring record to medical appointments ? Reports swelling in legs has  improved with use of compression socks . Counsel on importance of medication adherence.  ? Reports using weekly pillbox as adherence aid ? Reports using Trelegy inhaler, 1 puff daily, as directed. Confirmed continuing to use rescue (Combivent) inhaler as needed as directed ? Confirms rinsing out mouth after each use of Trelegy . Counsel on benefits of smoking cessation ? Reports not ready to quit today ? Reports currently smoking ~3 cigarettes/day ? Note patient has nicotine  patches (14mg /day strength) and have counsel on use . Coordination of care - Again encourage patient to call set up appointment/establish care with new psychiatrist. Patient reports last seen virtually by psychiatrist in [place] and referred to provider in [facility] for local care. Reports has misplaced number for [facility] provider ? Encourage patient to follow up with previous provider for the phone number and then call to make appointment . Patient denies further medication questions/concerns today  Patient Self Care Activities:  . To self administers medications as prescribed o Encourage patient to obtain weekly pillbox to use as adherence tool . Attends all scheduled provider appointments o Appointment with [facility] today . Calls pharmacy for medication refills . Calls provider office for new concerns or questions  Please see past updates related to this goal by clicking on the "Past Updates" button in the selected goal         Plan  Telephone follow up appointment with care management team member scheduled for: 02/02/2020 at 11:15 am  [name], PharmD, [place] [phone]

## 2019-12-13 NOTE — Patient Instructions (Signed)
Thank you allowing the Chronic Care Management Team to be a part of your care! It was a pleasure speaking with you today!     CCM (Chronic Care Management) Team    [name] RN, MSN, CCM Nurse Care Coordinator  [phone]   [name] PharmD  Clinical Pharmacist  [phone]   Eula Fried LCSW Clinical Social Worker [phone]  Visit Information  Goals Addressed            This Visit's Progress    PharmD - Medication Management       CARE PLAN ENTRY (see longtitudinal plan of care for additional care plan information)   Current Barriers:   Chronic Disease Management support, education, and care coordination needs related to previous CVA, COPD, hypertension, hyperlipidemia, depression  Pharmacist Clinical Goal(s):   Over the next 30 days, patient will work with CM Pharmacist to complete medication review address needs identified  Interventions:  Counsel on importance of blood pressure monitoring and control ? Reports taking: ? Furosemide 20 mg daily ? Metoprolol ER 25 mg - 1/2 tablet (12.5 mg ) daily ? Note patient has home upper BP monitor ? Denies having checked home BP recently ? Encourage patient to monitor, keep log of BP and bring record to medical appointments ? Reports swelling in legs has improved with use of compression socks  Counsel on importance of medication adherence.  ? Reports using weekly pillbox as adherence aid ? Reports using Trelegy inhaler, 1 puff daily, as directed. Confirmed continuing to use rescue (Combivent) inhaler as needed as directed ? Confirms rinsing out mouth after each use of Trelegy  Counsel on benefits of smoking cessation ? Reports not ready to quit today ? Reports currently smoking ~3 cigarettes/day ? Note patient has nicotine patches (14mg /day strength) and have counsel on use  Coordination of care - Again encourage patient to call set up appointment/establish care with new psychiatrist. Patient  reports last seen virtually by psychiatrist in [place] and referred to provider in [facility] for local care. Reports has misplaced number for [facility] provider ? Encourage patient to follow up with previous provider for the phone number and then call to make appointment  Patient denies further medication questions/concerns today  Patient Self Care Activities:   To self administers medications as prescribed o Encourage patient to obtain weekly pillbox to use as adherence tool  Attends all scheduled provider appointments o Appointment with [place] today  Calls pharmacy for medication refills  Calls provider office for new concerns or questions  Please see past updates related to this goal by clicking on the "Past Updates" button in the selected goal         The patient verbalized understanding of instructions, educational materials, and care plan provided today and declined offer to receive copy of patient instructions, educational materials, and care plan.   Telephone follow up appointment with care management team member scheduled for: 02/02/2020 at 11:15 am  [name], PharmD, [place] [phone]

## 2019-12-14 ENCOUNTER — Ambulatory Visit
Payer: Medicare HMO | Attending: Student in an Organized Health Care Education/Training Program | Admitting: Student in an Organized Health Care Education/Training Program

## 2019-12-14 ENCOUNTER — Other Ambulatory Visit: Payer: Self-pay

## 2019-12-14 ENCOUNTER — Ambulatory Visit: Payer: Medicare HMO | Admitting: Licensed Clinical Social Worker

## 2019-12-14 DIAGNOSIS — G894 Chronic pain syndrome: Secondary | ICD-10-CM | POA: Diagnosis not present

## 2019-12-14 DIAGNOSIS — M25552 Pain in left hip: Secondary | ICD-10-CM | POA: Diagnosis not present

## 2019-12-14 DIAGNOSIS — F332 Major depressive disorder, recurrent severe without psychotic features: Secondary | ICD-10-CM

## 2019-12-14 DIAGNOSIS — M47816 Spondylosis without myelopathy or radiculopathy, lumbar region: Secondary | ICD-10-CM | POA: Diagnosis not present

## 2019-12-14 DIAGNOSIS — M25551 Pain in right hip: Secondary | ICD-10-CM | POA: Diagnosis not present

## 2019-12-14 DIAGNOSIS — F411 Generalized anxiety disorder: Secondary | ICD-10-CM

## 2019-12-14 DIAGNOSIS — J449 Chronic obstructive pulmonary disease, unspecified: Secondary | ICD-10-CM

## 2019-12-14 DIAGNOSIS — I10 Essential (primary) hypertension: Secondary | ICD-10-CM

## 2019-12-14 MED ORDER — GABAPENTIN 300 MG PO CAPS: 600.0000 mg | ORAL_CAPSULE | Freq: Three times a day (TID) | ORAL | 5 refills | Status: DC

## 2019-12-14 NOTE — Chronic Care Management (AMB) (Signed)
Chronic Care Management    Clinical Social Work Follow Up Note  12/14/2019 Name: Beth [name] MRN: [id number] DOB: [birth year]  Beth [name] is a 74 y.o. year old female who is a primary care patient of Beth Bangs, FNP. The CCM team was consulted for assistance with Mental Health Counseling and Resources.   Review of patient status, including review of consultants reports, other relevant assessments, and collaboration with appropriate care team members and the patient's provider was performed as part of comprehensive patient evaluation and provision of chronic care management services.    SDOH (Social Determinants of Health) assessments performed: Yes    Outpatient Encounter Medications as of 12/14/2019  Medication Sig  . acetaminophen (TYLENOL) 500 MG tablet Take 500 mg by mouth 2 (two) times daily as needed.   Marland Kitchen aspirin 81 MG chewable tablet Chew 81 mg daily by mouth.  Marland Kitchen atorvastatin (LIPITOR) 40 MG tablet TAKE 1 TABLET BY MOUTH EVERY DAY  . azelastine (ASTELIN) 0.1 % nasal spray Place 2 sprays into both nostrils 2 (two) times daily.  . clonazePAM (KLONOPIN) 0.5 MG tablet TK 1 T PO DAILY PRN  . COMBIVENT RESPIMAT 20-100 MCG/ACT AERS respimat INHALE 1 PUFF BY MOUTH EVERY 6 HOURS  . FLUoxetine (PROZAC) 20 MG tablet Take 60 mg by mouth daily.   . Fluticasone-Umeclidin-Vilant (TRELEGY ELLIPTA) 200-62.5-25 MCG/INH AEPB Inhale 1 puff into the lungs daily.  . furosemide (LASIX) 20 MG tablet Take 1 tablet (20 mg total) by mouth daily.  Marland Kitchen gabapentin (NEURONTIN) 300 MG capsule Take 2 capsules (600 mg total) by mouth 3 (three) times daily.  . hydrOXYzine (ATARAX/VISTARIL) 25 MG tablet Take 25-50 mg by mouth daily as needed.  . Melatonin 3 MG CAPS Take 10 mg by mouth at bedtime as needed (sleep).   . metoprolol succinate (TOPROL-XL) 25 MG 24 hr tablet TAKE 1/2 TABLET(12.5 MG) BY MOUTH DAILY  . montelukast (SINGULAIR) 10 MG tablet TAKE 1 TABLET(10 MG) BY MOUTH AT BEDTIME  . SEROQUEL  100 MG tablet Take 100 mg by mouth at bedtime.   Facility-Administered Encounter Medications as of 12/14/2019  Medication  . albuterol (PROVENTIL) (2.5 MG/3ML) 0.083% nebulizer solution 2.5 mg     Goals Addressed    .  SW: I'm doing better now with my stress (pt-stated)        Current Barriers:  . Chronic Mental Health needs related to depression . Financial constraints related to managing health care expenses . Limited social support . ADL IADL limitations . Limited access to caregiver . Inability to perform ADL's independently . Suicidal Ideation/Homicidal Ideation: No  Clinical Social Work Goal(s):  Marland Kitchen Over the next 120 days, patient will work with SW  bi-monthly  by telephone or in person to reduce or manage symptoms related to depression . Over the next 120 days, patient will work with SW to address concerns related to decreasing depression and increasing self-care  Interventions: . Patient interviewed and appropriate assessments performed: brief mental health assessment . Provided mental health counseling with regard to depression and anxiety. Patient reports managing her mental health well at this time. Patient shares that her anxiety is worse than her depression at this time. Patient reports getting outside and getting fresh air helps with anxiety. LCSW educated patient on coping methods to implement into her daily life to combat depressive symptoms and stress. Patient denied any suicidal or homicidal ideations. Encouraged patient to implement deep breathing exercises into her daily routine due to ongoing stress. Patient  reports that she is actively working on both her physical and mental health. She shares that her legs continue to swell even though she is now on Lasix. Patient reports that she is using her compression stockings during the day and elevating her legs at night as needed. Patient is still hopeful for some relief from the swelling.  . Patient reports that her  psychiatrist is in [place] and she has been meeting her regularly over the phone but her now her psychiatrist wants her to discontinue services and find a mental health provider that is closer to her location. Her psychiatrist made a recommendation but patient has forgot this contact information and will need to contact them again to get this recommendation. Patient will contact CCM LCSW if she has any issues with gaining new mental health services.   . Discussed plans with patient for ongoing care management follow up and provided patient with direct contact information for care management team . Provided education and assistance to client regarding Advanced Directives. . A voluntary and extensive discussion about advanced care planning including explanation and discussion of advanced was undertaken with the patient. Explanation regarding healthcare proxy and living will was reviewed and packet with forms with explanation of how to fill them out was given.   . Provided education to patient/caregiver about Hospice and/or Palliative Care services . Mindfulness or Relaxation Training provided during session for patient to implement when faced with future triggers . Patient reports implementing appropriate self-care by getting up each day and walking outside to get some fresh air. LCSW provided positive reinforcement for this accomplishment. . Patient denies any issues with her sleep routine. She reports that she takes a melatonin vitamin every night which helps her to unwind from the day.    Patient Self Care Activities:  . Calls provider office for new concerns or questions . Ability for insight . Motivation for treatment  Patient Coping Strengths:  . Hopefulness . Self Advocate . Able to Communicate Effectively  Patient Self Care Deficits:  . Lacks social connections  Please see past updates related to this goal by clicking on the "Past Updates" button in the selected goal          Follow Up Plan: SW will follow up with patient by phone over the next quarter  Beth [name], [place], MSW, [email or web address] Phone: [phone]

## 2019-12-14 NOTE — Progress Notes (Signed)
Patient: Beth [name]  Service Category: E/M  Provider: [name], MD  DOB: May 05, 1945  DOS: 12/14/2019  Location: Office  MRN: [id number]  Setting: Ambulatory outpatient  Referring Provider: Verl Bangs, FNP  Type: Established Patient  Specialty: Interventional Pain Management  PCP: Beth Bangs, FNP  Location: Home  Delivery: TeleHealth     Virtual Encounter - Pain Management PROVIDER NOTE: Information contained herein reflects review and annotations entered in association with encounter. Interpretation of such information and data should be left to medically-trained personnel. Information provided to patient can be located elsewhere in the medical record under "Patient Instructions". Document created using STT-dictation technology, any transcriptional errors that may result from process are unintentional.    Contact & Pharmacy Preferred: [phone] Home: [phone] (home) Mobile: [phone] (mobile) E-mail: No e-mail address on record  [facility], [place] [place] [address] Phone: [phone] Fax: [phone]   Pre-screening  Beth [name] offered "in-person" vs "virtual" encounter. She indicated preferring virtual for this encounter.   Reason COVID-19*  Social distancing based on CDC and AMA recommendations.   I contacted Beth [name] on 12/14/2019 via telephone.      I clearly identified myself as [name], MD. I verified that I was speaking with the correct person using two identifiers (Name: Beth [name], and date of birth: [birth year]).  Consent I sought verbal advanced consent from Beth [name] for virtual visit interactions. I informed Beth [name] of possible security and privacy concerns, risks, and limitations associated with providing "not-in-person" medical evaluation and management services. I also informed Beth [name] of the availability of  "in-person" appointments. Finally, I informed her that there would be a charge for the virtual visit and that she could be  personally, fully or partially, financially responsible for it. Beth [name] expressed understanding and agreed to proceed.   Historic Elements   Beth [name] is a 74 y.o. year old, female patient evaluated today after our last contact on 11/30/2019. Beth [name]  has a past medical history of Allergy, Anxiety, Asthma, Chronic pain syndrome, COPD (chronic obstructive pulmonary disease) ([place]), CVA (cerebral infarction), Depression, Fibromyalgia, Headache, Hyperlipidemia, Hypertension, IBS (irritable bowel syndrome), Stroke ([place]), and Vitamin D deficiency. She also  has a past surgical history that includes Breast surgery (2011); Cervical discectomy; Appendectomy; Abdominal hysterectomy; Cholecystectomy; Sinusotomy; and Breast excisional biopsy (2011). Beth [name] has a current medication list which includes the following prescription(s): acetaminophen, aspirin, atorvastatin, azelastine, clonazepam, combivent respimat, fluoxetine, trelegy ellipta, furosemide, gabapentin, hydroxyzine, melatonin, metoprolol succinate, montelukast, and seroquel, and the following Facility-Administered Medications: albuterol. She  reports that she has been smoking cigarettes. She has a 85.50 pack-year smoking history. She has never used smokeless tobacco. She reports that she does not drink alcohol and does not use drugs. Beth [name] is allergic to bextra  [valdecoxib], compazine [prochlorperazine edisylate], lithium carbonate, and lyrica [pregabalin].   HPI  Today, she is being contacted for medication management. and worsening low back pain   Patient requesting refill of gabapentin.  She is also status post right L3, L4, L5 lumbar radiofrequency ablation on 07/07/2019 (after having to positive diagnostic lumbar facet medial branch nerve blocks bilaterally at L3, L4, L5). Patient is endorsing  return of low back pain that is impacting her functional status.  Discussed repeating lumbar radiofrequency ablation versus palliative bilateral lumbar medial branch nerve blocks and  patient would like to try medial branch nerve blocks and if those are not effective, she would like to repeat lumbar radiofrequency ablation.  This is reasonable.  UDS:  Summary  Date Value Ref Range Status  02/24/2017 FINAL  Final    Comment:    ==================================================================== [facility] COMP DRUG ANALYSIS,UR ==================================================================== Test                             Result       Flag       Units Drug Present and Declared for Prescription Verification   Gabapentin                     PRESENT      EXPECTED   Aripiprazole                   PRESENT      EXPECTED   Metoprolol                     PRESENT      EXPECTED Drug Present not Declared for Prescription Verification   7-aminoclonazepam              368          UNEXPECTED ng/mg creat    7-aminoclonazepam is an expected metabolite of clonazepam. Source    of clonazepam is a scheduled prescription medication.   Citalopram                     PRESENT      UNEXPECTED   Desmethylcitalopram            PRESENT      UNEXPECTED    Desmethylcitalopram is an expected metabolite of citalopram or    the enantiomeric form, escitalopram.   Hydroxyzine                    PRESENT      UNEXPECTED Drug Absent but Declared for Prescription Verification   Duloxetine                     Not Detected UNEXPECTED   Acetaminophen                  Not Detected UNEXPECTED    Acetaminophen, as indicated in the declared medication list, is    not always detected even when used as directed.   Salicylate                     Not Detected UNEXPECTED    Aspirin, as indicated in the declared medication list, is not    always detected even when used as  directed. ==================================================================== Test                      Result    Flag   Units      Ref Range   Creatinine              62               mg/dL      >=20 ==================================================================== Declared Medications:  The flagging and interpretation on this report are based on the  following declared medications.  Unexpected results may arise from  inaccuracies in the declared medications.  **Note: The testing scope of this panel includes these medications:  Aripiprazole  Duloxetine  Gabapentin  Metoprolol  **Note: The testing scope of this panel does not include small to  moderate amounts of these reported medications:  Acetaminophen  Aspirin (Aspirin 81)  **Note: The testing scope of this panel does not include following  reported medications:  Albuterol (Ipratropium-Albuterol)  Atorvastatin  Azelastine  Fluticasone  Fluticasone (Breo)  Ipratropium (Ipratropium-Albuterol)  Multivitamin (MVI)  Vilanterol (Breo) ==================================================================== For clinical consultation, please call [phone]. ====================================================================     Laboratory Chemistry Profile   Renal Lab Results  Component Value Date   BUN 24 (H) 08/17/2019   CREATININE 1.21 (H) 08/17/2019   BCR 23 (H) 03/23/2019   GFRAA 51 (L) 08/17/2019   GFRNONAA 44 (L) 08/17/2019     Hepatic Lab Results  Component Value Date   AST 26 08/17/2019   ALT 25 08/17/2019   ALBUMIN 3.9 08/17/2019   ALKPHOS 98 08/17/2019   LIPASE 27 02/13/2016     Electrolytes Lab Results  Component Value Date   NA 142 08/17/2019   K 4.2 08/17/2019   CL 101 08/17/2019   CALCIUM 9.5 08/17/2019   MG 1.8 12/25/2012     Bone Lab Results  Component Value Date   VD25OH 37 03/21/2016     Inflammation (CRP: Acute Phase) (ESR: Chronic Phase) Lab Results  Component Value Date    CRP 0.3 03/21/2016   ESRSEDRATE 1 03/21/2016   LATICACIDVEN 1.3 12/23/2015       Note: Above Lab results reviewed.  Imaging  US RENAL CLINICAL DATA:  Acute kidney injury  EXAM: RENAL / URINARY TRACT ULTRASOUND COMPLETE  COMPARISON:  02/13/2016  FINDINGS: Right Kidney:  Renal measurements: 10.0 x 4.3 x 4.3 cm = volume: 97 mL. Cortical thinning but normal echogenicity. No hydronephrosis. Right kidney midpole anechoic round cyst measures 2.3 x 1.9 x 2.6 cm.  Left Kidney:  Renal measurements: 8.1 x 4.0 x 3.6 cm = volume: 62 mL. Similar cortical thinning but normal echogenicity. No hydronephrosis or acute finding. No focal abnormality.  Bladder:  Appears normal for degree of bladder distention.  Other:  None.  IMPRESSION: Renal cortical thinning.  No acute finding or hydronephrosis.  2.6 cm right kidney midpole cyst.  Electronically Signed   By: Jerilynn Mages.  Shick M.D.   On: 11/24/2019 09:12  Assessment  The primary encounter diagnosis was Lumbar facet arthropathy. Diagnoses of Lumbar spondylosis, Right hip pain, Left hip pain, and Chronic pain syndrome were also pertinent to this visit.  Plan of Care  [name] has a current medication list which includes the following long-term medication(s): atorvastatin, azelastine, combivent respimat, fluoxetine, furosemide, gabapentin, metoprolol succinate, and montelukast.  Pharmacotherapy (Medications Ordered): Meds ordered this encounter  Medications  . gabapentin (NEURONTIN) 300 MG capsule    Sig: Take 2 capsules (600 mg total) by mouth 3 (three) times daily.    Dispense:  180 capsule    Refill:  5   Orders:  Orders Placed This Encounter  Procedures  . LUMBAR FACET(MEDIAL BRANCH NERVE BLOCK) MBNB    Standing Status:   Future    Standing Expiration Date:   01/13/2020    Scheduling Instructions:     Procedure: Lumbar facet block (AKA.: Lumbosacral medial branch nerve block)     Side: Bilateral     Level:  L3-4, L4-5,Facets (L3, L4, L5, Medial Branch Nerves)     Sedation: with     Timeframe: ASAA    Order Specific Question:   Where will this procedure be performed?  Answer:   [facility]   Follow-up plan:   Return in about 2 weeks (around 12/28/2019) for B/L L3, 4, 5 Facet #3, with sedation.     Status post bilateral facet medial branch nerve blocks L3, L4, L5 on 09/03/2017, helpful for axial low back and buttock pain: Repeat PRN.  Status post right L4-L5 epidural steroid injection on 03/09/2018, 07/29/2018 which was helpful for her right-sided hip and leg pain.  Right L4-L5 ESI #3 on 09/23/2027 not as helpful.  Right L3, L4, L5 RFA on 07/07/2019        Recent Visits No visits were found meeting these conditions. Showing recent visits within past 90 days and meeting all other requirements Today's Visits Date Type Provider Dept  12/14/19 Telemedicine [name], MD [facility]  Showing today's visits and meeting all other requirements Future Appointments No visits were found meeting these conditions. Showing future appointments within next 90 days and meeting all other requirements  I discussed the assessment and treatment plan with the patient. The patient was provided an opportunity to ask questions and all were answered. The patient agreed with the plan and demonstrated an understanding of the instructions.  Patient advised to call back or seek an in-person evaluation if the symptoms or condition worsens.  Duration of encounter:20 minutes.  Note by: [name], MD Date: 12/14/2019; Time: 1:41 PM

## 2019-12-30 ENCOUNTER — Ambulatory Visit: Payer: Self-pay | Admitting: General Practice

## 2019-12-30 ENCOUNTER — Telehealth: Payer: Medicare HMO | Admitting: General Practice

## 2019-12-30 DIAGNOSIS — R0600 Dyspnea, unspecified: Secondary | ICD-10-CM

## 2019-12-30 DIAGNOSIS — G8929 Other chronic pain: Secondary | ICD-10-CM

## 2019-12-30 DIAGNOSIS — R0609 Other forms of dyspnea: Secondary | ICD-10-CM

## 2019-12-30 DIAGNOSIS — F411 Generalized anxiety disorder: Secondary | ICD-10-CM

## 2019-12-30 DIAGNOSIS — F332 Major depressive disorder, recurrent severe without psychotic features: Secondary | ICD-10-CM

## 2019-12-30 DIAGNOSIS — M7989 Other specified soft tissue disorders: Secondary | ICD-10-CM

## 2019-12-30 DIAGNOSIS — J449 Chronic obstructive pulmonary disease, unspecified: Secondary | ICD-10-CM

## 2019-12-30 DIAGNOSIS — M545 Low back pain, unspecified: Secondary | ICD-10-CM

## 2019-12-30 DIAGNOSIS — I10 Essential (primary) hypertension: Secondary | ICD-10-CM

## 2019-12-30 NOTE — Patient Instructions (Signed)
Visit Information  Goals Addressed            This Visit's Progress   . RNCM: I have a lot of health issues going on       [place] (see longtitudinal plan of care for additional care plan information)  Current Barriers:  . Chronic Disease Management support, education, and care coordination needs related to HTN, COPD, Depression, and Bilateral leg swelling  . Financial barriers . Mobility barriers   Clinical Goal(s) related to HTN, COPD, Depression, and bilateral leg swelling :  Over the next 120 days, patient will:  . Work with the care management team to address educational, disease management, and care coordination needs  . Begin or continue self health monitoring activities as directed today Measure and record blood pressure 5 times per week, Measure and record weight daily, and wear compression stockings to help with bilateral leg swelling  . Call provider office for new or worsened signs and symptoms Blood pressure findings outside established parameters, Weight outside established parameters, Shortness of breath, and New or worsened symptom related to bilateral leg swelling or depression . Call care management team with questions or concerns . Verbalize basic understanding of patient centered plan of care established today  Interventions related to HTN, COPD, Depression, and Bilateral lower leg swelling :  . Evaluation of current treatment plans and patient's adherence to plan as established by provider.  The patient is currently seeing cardiology and having testing done to determine if her heart is the reason for the swelling she is having and the shortness of breath. The patient recently had a stress test and and ECHO and her EF% is 60-65%.  12-30-2019: The patient is still having some swelling but it is not as bad. She states that she has good days and bad days with her breathing. She says if she walks for long distances she can really tell.  . Assessed patient  understanding of disease states.  09-16-2019: The patient states she saw the pulmonary provider recently and they have started her on a new inhaler. She has not gone to pick it up yet but will today. She is hoping this with help with her COPD and the episodes of shortness of breath she is having. She states her shortness of breath is about the same. Endorses using one pillow at night doubled up when sleeping. States she is better when it is not so hot outside.  12-30-2019: Breathing is stable today. She states she is going to have a procedure on 01-10-2020 where they put needles in her back to help with the back pain she is having. She states she has had this done before and sometimes it helps and sometimes it does not. The patient states that she is hopeful it will help with her pain and discomfort.  . Assessed patient's education and care coordination needs.  The patient verbalized she is using compression hose and that is helping with the swelling. The patient says she still has some but not as much. The patient advised to elevate her legs when sitting down. The patient admits she does not always do this but will try to start remembering to do this.12-30-2019. The patient states the swelling is about the same. She can not always elevate her legs but does when she can. Reminded the patient the importance of follow up with providers to keep symptoms managed. She says the swelling is better since the weather is cooler.  . Provided the patient with  contact information for the CCM team.  . Assessed for device needs. The patient does not have a blood pressure cuff or a scale to monitor blood pressure or weights. Review of resources available. Completed- the patient now has a blood pressure cuff and scale and is using daily. Today's weight is 157.6 and her blood pressure with pulse is 100/56.  Education on orthostatic hypotension and to change position slowly. 12-30-2019: The patient is checking her blood pressure  regularly. States yesterday was 103/67. Weight is stable. She states she has gained some weight but not a lot. She states with Thanksgiving and Christmas she usually does gain a little.  . Provided disease specific education to patient: review of wearing of compression stockings (currently the patient is using compression stockings on bilateral legs.  Assessment reveals that the patient does not have any weeping from the legs or sores), elevation of feet and legs when sitting, the benefit of using blood pressure cuff to take daily readings- the patient is now checking blood pressure daily and recording- Today's is 100/56, and the benefit of using a scale to record daily weights- patient is doing daily weights and today is 157.6. Review of notification to the pcp/card for weight gain or loss +/- 3 pounds in one day or +/- 5 pounds in one week. The patient verbalized understanding. 12-30-2019: Review and support given to the patient.  Nash Dimmer with appropriate clinical care team members regarding patient needs: Pharmacy referral in place, LCSW referral in place. The CCM team to assist with meeting the patients health and wellness needs and goals by working with the pcp and interdisciplinary team. . Evaluation of pulmonary status. 12-30-2019: The patient endorses taking medications as prescribed. The patient states her breathing is stable at this time.  Will continue to monitor. The patient states she is doing well and denies any new concerns.  . Review of appointments: 12-30-2019: Has an upcoming appointment for injections with pain specialist on 01-10-2020.  No upcoming with pcp.  Knows to call the office for changes or questions.   Patient Self Care Activities related to HTN, COPD, Depression, and bilateral leg swelling :  . Patient is unable to independently self-manage chronic health conditions  Please see past updates related to this goal by clicking on the "Past Updates" button in the selected  goal         The patient verbalized understanding of instructions, educational materials, and care plan provided today and declined offer to receive copy of patient instructions, educational materials, and care plan.   Telephone follow up appointment with care management team member scheduled for: 02-24-2020 at 1 pm  [name] RN, MSN, [place] Mobile: [phone]

## 2019-12-30 NOTE — Chronic Care Management (AMB) (Signed)
Chronic Care Management   Follow Up Note   12/30/2019 Name: Beth [name] MRN: [id number] DOB: 14-Aug-1945  Referred by: Verl Bangs, FNP Reason for referral : Chronic Care Management (RNCM Follow up: Chronic Disease Managment and Care Coordination Needs)   Beth [name] is a 74 y.o. year old female who is a primary care patient of Verl Bangs, FNP. The CCM team was consulted for assistance with chronic disease management and care coordination needs.    Review of patient status, including review of consultants reports, relevant laboratory and other test results, and collaboration with appropriate care team members and the patient's provider was performed as part of comprehensive patient evaluation and provision of chronic care management services.    SDOH (Social Determinants of Health) assessments performed: Yes See Care Plan activities for detailed interventions related to [facility])     Outpatient Encounter Medications as of 12/30/2019  Medication Sig  . acetaminophen (TYLENOL) 500 MG tablet Take 500 mg by mouth 2 (two) times daily as needed.   Marland Kitchen aspirin 81 MG chewable tablet Chew 81 mg daily by mouth.  Marland Kitchen atorvastatin (LIPITOR) 40 MG tablet TAKE 1 TABLET BY MOUTH EVERY DAY  . azelastine (ASTELIN) 0.1 % nasal spray Place 2 sprays into both nostrils 2 (two) times daily.  . clonazePAM (KLONOPIN) 0.5 MG tablet TK 1 T PO DAILY PRN  . COMBIVENT RESPIMAT 20-100 MCG/ACT AERS respimat INHALE 1 PUFF BY MOUTH EVERY 6 HOURS  . FLUoxetine (PROZAC) 20 MG tablet Take 60 mg by mouth daily.   . Fluticasone-Umeclidin-Vilant (TRELEGY ELLIPTA) 200-62.5-25 MCG/INH AEPB Inhale 1 puff into the lungs daily.  . furosemide (LASIX) 20 MG tablet Take 1 tablet (20 mg total) by mouth daily.  Marland Kitchen gabapentin (NEURONTIN) 300 MG capsule Take 2 capsules (600 mg total) by mouth 3 (three) times daily.  . hydrOXYzine (ATARAX/VISTARIL) 25 MG tablet Take 25-50 mg by mouth daily as needed.  . Melatonin 3 MG CAPS Take  10 mg by mouth at bedtime as needed (sleep).   . metoprolol succinate (TOPROL-XL) 25 MG 24 hr tablet TAKE 1/2 TABLET(12.5 MG) BY MOUTH DAILY  . montelukast (SINGULAIR) 10 MG tablet TAKE 1 TABLET(10 MG) BY MOUTH AT BEDTIME  . SEROQUEL 100 MG tablet Take 100 mg by mouth at bedtime.   Facility-Administered Encounter Medications as of 12/30/2019  Medication  . albuterol (PROVENTIL) (2.5 MG/3ML) 0.083% nebulizer solution 2.5 mg     Objective:  BP Readings from Last 3 Encounters:  09/14/19 122/62  08/17/19 120/60  07/09/19 (!) 119/52    Goals Addressed            This Visit's Progress   . RNCM: I have a lot of health issues going on       [place] (see longtitudinal plan of care for additional care plan information)  Current Barriers:  . Chronic Disease Management support, education, and care coordination needs related to HTN, COPD, Depression, and Bilateral leg swelling  . Financial barriers . Mobility barriers   Clinical Goal(s) related to HTN, COPD, Depression, and bilateral leg swelling :  Over the next 120 days, patient will:  . Work with the care management team to address educational, disease management, and care coordination needs  . Begin or continue self health monitoring activities as directed today Measure and record blood pressure 5 times per week, Measure and record weight daily, and wear compression stockings to help with bilateral leg swelling  . Call provider office for new  or worsened signs and symptoms Blood pressure findings outside established parameters, Weight outside established parameters, Shortness of breath, and New or worsened symptom related to bilateral leg swelling or depression . Call care management team with questions or concerns . Verbalize basic understanding of patient centered plan of care established today  Interventions related to HTN, COPD, Depression, and Bilateral lower leg swelling :  . Evaluation of current treatment plans and  patient's adherence to plan as established by provider.  The patient is currently seeing cardiology and having testing done to determine if her heart is the reason for the swelling she is having and the shortness of breath. The patient recently had a stress test and and ECHO and her EF% is 60-65%.  12-30-2019: The patient is still having some swelling but it is not as bad. She states that she has good days and bad days with her breathing. She says if she walks for long distances she can really tell.  . Assessed patient understanding of disease states.  09-16-2019: The patient states she saw the pulmonary provider recently and they have started her on a new inhaler. She has not gone to pick it up yet but will today. She is hoping this with help with her COPD and the episodes of shortness of breath she is having. She states her shortness of breath is about the same. Endorses using one pillow at night doubled up when sleeping. States she is better when it is not so hot outside.  12-30-2019: Breathing is stable today. She states she is going to have a procedure on 01-10-2020 where they put needles in her back to help with the back pain she is having. She states she has had this done before and sometimes it helps and sometimes it does not. The patient states that she is hopeful it will help with her pain and discomfort.  . Assessed patient's education and care coordination needs.  The patient verbalized she is using compression hose and that is helping with the swelling. The patient says she still has some but not as much. The patient advised to elevate her legs when sitting down. The patient admits she does not always do this but will try to start remembering to do this.12-30-2019. The patient states the swelling is about the same. She can not always elevate her legs but does when she can. Reminded the patient the importance of follow up with providers to keep symptoms managed. She says the swelling is better since the  weather is cooler.  . Provided the patient with contact information for the CCM team.  . Assessed for device needs. The patient does not have a blood pressure cuff or a scale to monitor blood pressure or weights. Review of resources available. Completed- the patient now has a blood pressure cuff and scale and is using daily. Today's weight is 157.6 and her blood pressure with pulse is 100/56.  Education on orthostatic hypotension and to change position slowly. 12-30-2019: The patient is checking her blood pressure regularly. States yesterday was 103/67. Weight is stable. She states she has gained some weight but not a lot. She states with Thanksgiving and Christmas she usually does gain a little.  . Provided disease specific education to patient: review of wearing of compression stockings (currently the patient is using compression stockings on bilateral legs.  Assessment reveals that the patient does not have any weeping from the legs or sores), elevation of feet and legs when sitting, the benefit of using blood  pressure cuff to take daily readings- the patient is now checking blood pressure daily and recording- Today's is 100/56, and the benefit of using a scale to record daily weights- patient is doing daily weights and today is 157.6. Review of notification to the pcp/card for weight gain or loss +/- 3 pounds in one day or +/- 5 pounds in one week. The patient verbalized understanding. 12-30-2019: Review and support given to the patient.  Nash Dimmer with appropriate clinical care team members regarding patient needs: Pharmacy referral in place, LCSW referral in place. The CCM team to assist with meeting the patients health and wellness needs and goals by working with the pcp and interdisciplinary team. . Evaluation of pulmonary status. 12-30-2019: The patient endorses taking medications as prescribed. The patient states her breathing is stable at this time.  Will continue to monitor. The patient states  she is doing well and denies any new concerns.  . Review of appointments: 12-30-2019: Has an upcoming appointment for injections with pain specialist on 01-10-2020.  No upcoming with pcp.  Knows to call the office for changes or questions.   Patient Self Care Activities related to HTN, COPD, Depression, and bilateral leg swelling :  . Patient is unable to independently self-manage chronic health conditions  Please see past updates related to this goal by clicking on the "Past Updates" button in the selected goal         There are no care plans to display for this patient.   Plan:   Telephone follow up appointment with care management team member scheduled for: 02-24-2020 at 1 pm   [name] RN, MSN, [place] Mobile: [phone]

## 2020-01-05 ENCOUNTER — Other Ambulatory Visit: Payer: Self-pay | Admitting: Family Medicine

## 2020-01-05 DIAGNOSIS — E785 Hyperlipidemia, unspecified: Secondary | ICD-10-CM

## 2020-01-05 DIAGNOSIS — I69359 Hemiplegia and hemiparesis following cerebral infarction affecting unspecified side: Secondary | ICD-10-CM

## 2020-01-05 NOTE — Telephone Encounter (Signed)
Requested medication (s) are due for refill today:   Yes  Requested medication (s) are on the active medication list:   Yes  Future visit scheduled:   No   Last ordered: 10/18/2019 #90, 0 refills  Returned because labs due.   Requested Prescriptions  Pending Prescriptions Disp Refills   atorvastatin (LIPITOR) 40 MG tablet [Pharmacy Med Name: ATORVASTATIN 40MG  TABLETS] 90 tablet 0    Sig: TAKE 1 TABLET BY MOUTH EVERY DAY      Cardiovascular:  Antilipid - Statins Failed - 01/05/2020 12:33 PM      Failed - Total Cholesterol in normal range and within 360 days    Cholesterol, Total  Date Value Ref Range Status  03/23/2015 118 100 - 199 mg/dL Final   Cholesterol  Date Value Ref Range Status  04/10/2018 135 <200 mg/dL Final  12/25/2012 185 0 - 200 mg/dL Final          Failed - LDL in normal range and within 360 days    Ldl Cholesterol, Calc  Date Value Ref Range Status  12/25/2012 127 (H) 0 - 100 mg/dL Final   LDL Cholesterol (Calc)  Date Value Ref Range Status  04/10/2018 64 mg/dL (calc) Final    Comment:    Reference range: <100 . Desirable range <100 mg/dL for primary prevention;   <70 mg/dL for patients with CHD or diabetic patients  with > or = 2 CHD risk factors. Marland Kitchen LDL-C is now calculated using the Martin-Hopkins  calculation, which is a validated novel method providing  better accuracy than the Friedewald equation in the  estimation of LDL-C.  Cresenciano Genre et al. Annamaria Helling. 0037;048(88): 2061-2068  (http://education.[facility].com/faq/FAQ164)           Failed - HDL in normal range and within 360 days    HDL Cholesterol  Date Value Ref Range Status  12/25/2012 39 (L) 40 - 60 mg/dL Final   HDL  Date Value Ref Range Status  04/10/2018 59 > OR = 50 mg/dL Final  03/23/2015 44 >39 mg/dL Final          Failed - Triglycerides in normal range and within 360 days    Triglycerides  Date Value Ref Range Status  04/10/2018 50 <150 mg/dL Final  12/25/2012 93 0 -  200 mg/dL Final          Passed - Patient is not pregnant      Passed - Valid encounter within last 12 months    Recent Outpatient Visits           6 months ago Benign hypertension   [facility], Lupita Raider, FNP   9 months ago Leg swelling   [facility], Lupita Raider, [place]   9 months ago Leg swelling   [facility], Lupita Raider, [place]   11 months ago COPD with acute exacerbation [facility])   [place], DO   1 year ago Perennial allergic rhinitis with seasonal variation   [place], [name], [place]

## 2020-01-10 ENCOUNTER — Encounter: Payer: Self-pay | Admitting: Student in an Organized Health Care Education/Training Program

## 2020-01-10 ENCOUNTER — Ambulatory Visit
Admission: RE | Admit: 2020-01-10 | Discharge: 2020-01-10 | Disposition: A | Discharge status: Home / Self Care | Payer: Medicare HMO | Source: Ambulatory Visit | Attending: Student in an Organized Health Care Education/Training Program | Admitting: Student in an Organized Health Care Education/Training Program

## 2020-01-10 ENCOUNTER — Ambulatory Visit (HOSPITAL_BASED_OUTPATIENT_CLINIC_OR_DEPARTMENT_OTHER): Payer: Medicare HMO | Admitting: Student in an Organized Health Care Education/Training Program

## 2020-01-10 ENCOUNTER — Other Ambulatory Visit: Payer: Self-pay

## 2020-01-10 DIAGNOSIS — M47816 Spondylosis without myelopathy or radiculopathy, lumbar region: Secondary | ICD-10-CM | POA: Diagnosis not present

## 2020-01-10 DIAGNOSIS — G894 Chronic pain syndrome: Secondary | ICD-10-CM | POA: Insufficient documentation

## 2020-01-10 MED ORDER — FENTANYL CITRATE (PF) 100 MCG/2ML IJ SOLN
25.0000 ug | INTRAMUSCULAR | Status: DC | PRN
  Administered 2020-01-10: 25 ug via INTRAVENOUS
  Filled 2020-01-10: qty 2

## 2020-01-10 MED ORDER — ROPIVACAINE HCL 2 MG/ML IJ SOLN
9.0000 mL | Freq: Once | INTRAMUSCULAR | Status: AC
  Administered 2020-01-10: 9 mL via PERINEURAL
  Filled 2020-01-10: qty 10

## 2020-01-10 MED ORDER — HYDROCODONE-ACETAMINOPHEN 5-325 MG PO TABS: 1.0000 | ORAL_TABLET | Freq: Four times a day (QID) | ORAL | 0 refills | Status: AC | PRN

## 2020-01-10 MED ORDER — DEXAMETHASONE SODIUM PHOSPHATE 10 MG/ML IJ SOLN
10.0000 mg | Freq: Once | INTRAMUSCULAR | Status: AC
  Administered 2020-01-10: 10 mg
  Filled 2020-01-10: qty 1

## 2020-01-10 MED ORDER — LIDOCAINE HCL 2 % IJ SOLN
20.0000 mL | Freq: Once | INTRAMUSCULAR | Status: AC
  Administered 2020-01-10: 400 mg

## 2020-01-10 MED ORDER — LACTATED RINGERS IV SOLN
1000.0000 mL | Freq: Once | INTRAVENOUS | Status: AC
  Administered 2020-01-10: 1000 mL via INTRAVENOUS

## 2020-01-10 NOTE — Patient Instructions (Signed)

## 2020-01-10 NOTE — Progress Notes (Signed)
Safety precautions to be maintained throughout the outpatient stay will include: orient to surroundings, keep bed in low position, maintain call bell within reach at all times, provide assistance with transfer out of bed and ambulation.  

## 2020-01-10 NOTE — Progress Notes (Signed)
Patient's Name: Beth [name]  MRN: [id number]  Referring Provider: [name], MD  DOB: 04-08-1945  PCP: Verl Bangs, FNP  DOS: 01/10/2020  Note by: [name], MD  Service setting: Ambulatory outpatient  Specialty: Interventional Pain Management  Patient type: Established  Location: [facility] (AMB) Pain Management Facility  Visit type: Interventional Procedure   Primary Reason for Visit: Interventional Pain Management Treatment. CC: Back Pain (lower)  Procedure:          Anesthesia, Analgesia, Anxiolysis:  Type: Lumbar Facet, Medial Branch Block(s) #2  Primary Purpose: Therapeutic Region: Posterolateral Lumbosacral Spine Level: L3, L4, L5, Medial Branch Level(s). Injecting these levels blocks the L3-4, L4-5,  lumbar facet joints. Laterality: Bilateral  Type: Moderate (Conscious) Sedation combined with Local Anesthesia Indication(s): Analgesia and Anxiety Route: Intravenous (IV) IV Access: Secured Sedation: Meaningful verbal contact was maintained at all times during the procedure  Local Anesthetic: Lidocaine 1-2%   Indications: 1. Lumbar facet arthropathy   2. Lumbar spondylosis   3. Chronic pain syndrome    Pain Score: Pre-procedure: 8 /10 Post-procedure: 0-No pain/10  Pre-op Assessment:  Beth [name] is a 74 y.o. (year old), female patient, seen today for interventional treatment. She  has a past surgical history that includes Breast surgery (2011); Cervical discectomy; Appendectomy; Abdominal hysterectomy; Cholecystectomy; Sinusotomy; and Breast excisional biopsy (2011). Beth [name] has a current medication list which includes the following prescription(s): acetaminophen, aspirin, atorvastatin, azelastine, clonazepam, combivent respimat, fluoxetine, trelegy ellipta, furosemide, gabapentin, hydroxyzine, melatonin, metoprolol succinate, montelukast, seroquel, and hydrocodone-acetaminophen, and the following Facility-Administered Medications: albuterol and fentanyl. Her  primarily concern today is the Back Pain (lower)  Initial Vital Signs:  Pulse/HCG Rate: 67ECG Heart Rate: 69 Temp: (!) 96.3 F (35.7 C) Resp: 18 BP: 116/69 SpO2: 97 %  BMI: Estimated body mass index is 26.63 kg/m as calculated from the following:   Height as of this encounter: 5\' 5"  (1.651 m).   Weight as of this encounter: 160 lb (72.6 kg).  Risk Assessment: Allergies: Reviewed. She is allergic to bextra  [valdecoxib], compazine [prochlorperazine edisylate], lithium carbonate, and lyrica [pregabalin].  Allergy Precautions: None required Coagulopathies: Reviewed. None identified.  Blood-thinner therapy: None at this time Active Infection(s): Reviewed. None identified. Beth [name] is afebrile  Site Confirmation: Beth [name] was asked to confirm the procedure and laterality before marking the site Procedure checklist: Completed Consent: Before the procedure and under the influence of no sedative(s), amnesic(s), or anxiolytics, the patient was informed of the treatment options, risks and possible complications. To fulfill our ethical and legal obligations, as recommended by the American Medical Association's Code of Ethics, I have informed the patient of my clinical impression; the nature and purpose of the treatment or procedure; the risks, benefits, and possible complications of the intervention; the alternatives, including doing nothing; the risk(s) and benefit(s) of the alternative treatment(s) or procedure(s); and the risk(s) and benefit(s) of doing nothing. The patient was provided information about the general risks and possible complications associated with the procedure. These may include, but are not limited to: failure to achieve desired goals, infection, bleeding, organ or nerve damage, allergic reactions, paralysis, and death. In addition, the patient was informed of those risks and complications associated to Spine-related procedures, such as failure to decrease pain;  infection (i.e.: Meningitis, epidural or intraspinal abscess); bleeding (i.e.: epidural hematoma, subarachnoid hemorrhage, or any other type of intraspinal or peri-dural bleeding); organ or nerve damage (i.e.: Any type of peripheral nerve, nerve root, or spinal cord injury) with subsequent  damage to sensory, motor, and/or autonomic systems, resulting in permanent pain, numbness, and/or weakness of one or several areas of the body; allergic reactions; (i.e.: anaphylactic reaction); and/or death. Furthermore, the patient was informed of those risks and complications associated with the medications. These include, but are not limited to: allergic reactions (i.e.: anaphylactic or anaphylactoid reaction(s)); adrenal axis suppression; blood sugar elevation that in diabetics may result in ketoacidosis or comma; water retention that in patients with history of congestive heart failure may result in shortness of breath, pulmonary edema, and decompensation with resultant heart failure; weight gain; swelling or edema; medication-induced neural toxicity; particulate matter embolism and blood vessel occlusion with resultant organ, and/or nervous system infarction; and/or aseptic necrosis of one or more joints. Finally, the patient was informed that Medicine is not an exact science; therefore, there is also the possibility of unforeseen or unpredictable risks and/or possible complications that may result in a catastrophic outcome. The patient indicated having understood very clearly. We have given the patient no guarantees and we have made no promises. Enough time was given to the patient to ask questions, all of which were answered to the patient's satisfaction. Beth [name] has indicated that she wanted to continue with the procedure. Attestation: I, the ordering provider, attest that I have discussed with the patient the benefits, risks, side-effects, alternatives, likelihood of achieving goals, and potential problems  during recovery for the procedure that I have provided informed consent. Date  Time: 01/10/2020 11:15 AM  Pre-Procedure Preparation:  Monitoring: As per clinic protocol. Respiration, ETCO2, SpO2, BP, heart rate and rhythm monitor placed and checked for adequate function Safety Precautions: Patient was assessed for positional comfort and pressure points before starting the procedure. Time-out: I initiated and conducted the "Time-out" before starting the procedure, as per protocol. The patient was asked to participate by confirming the accuracy of the "Time Out" information. Verification of the correct person, site, and procedure were performed and confirmed by me, the nursing staff, and the patient. "Time-out" conducted as per Joint Commission's Universal Protocol (UP.01.01.01). Time: 1149  Description of Procedure:          Position: Prone Laterality: Bilateral. The procedure was performed in identical fashion on both sides. Levels:  L3, L4, L5,  Medial Branch Level(s) Area Prepped: Posterior Lumbosacral Region Prepping solution: ChloraPrep (2% chlorhexidine gluconate and 70% isopropyl alcohol) Safety Precautions: Aspiration looking for blood return was conducted prior to all injections. At no point did we inject any substances, as a needle was being advanced. Before injecting, the patient was told to immediately notify me if she was experiencing any new onset of "ringing in the ears, or metallic taste in the mouth". No attempts were made at seeking any paresthesias. Safe injection practices and needle disposal techniques used. Medications properly checked for expiration dates. SDV (single dose vial) medications used. After the completion of the procedure, all disposable equipment used was discarded in the proper designated medical waste containers. Local Anesthesia: Protocol guidelines were followed. The patient was positioned over the fluoroscopy table. The area was prepped in the usual manner.  The time-out was completed. The target area was identified using fluoroscopy. A 12-in long, straight, sterile hemostat was used with fluoroscopic guidance to locate the targets for each level blocked. Once located, the skin was marked with an approved surgical skin marker. Once all sites were marked, the skin (epidermis, dermis, and hypodermis), as well as deeper tissues (fat, connective tissue and muscle) were infiltrated with a small amount of a  short-acting local anesthetic, loaded on a 10cc syringe with a 25G, 1.5-in  Needle. An appropriate amount of time was allowed for local anesthetics to take effect before proceeding to the next step. Local Anesthetic: Lidocaine 2.0% The unused portion of the local anesthetic was discarded in the proper designated containers. Technical explanation of process:   L3 Medial Branch Nerve Block (MBB): The target area for the L3 medial branch is at the junction of the postero-lateral aspect of the superior articular process and the superior, posterior, and medial edge of the transverse process of L4. Under fluoroscopic guidance, a Quincke needle was inserted until contact was made with os over the superior postero-lateral aspect of the pedicular shadow (target area). After negative aspiration for blood, 1 mL of the nerve block solution was injected without difficulty or complication. The needle was removed intact. L4 Medial Branch Nerve Block (MBB): The target area for the L4 medial branch is at the junction of the postero-lateral aspect of the superior articular process and the superior, posterior, and medial edge of the transverse process of L5. Under fluoroscopic guidance, a Quincke needle was inserted until contact was made with os over the superior postero-lateral aspect of the pedicular shadow (target area). After negative aspiration for blood,1 mL of the nerve block solution was injected without difficulty or complication. The needle was removed intact. L5 Medial  Branch Nerve Block (MBB): The target area for the L5 medial branch is at the junction of the postero-lateral aspect of the superior articular process and the superior, posterior, and medial edge of the sacral ala. Under fluoroscopic guidance, a Quincke needle was inserted until contact was made with os over the superior postero-lateral aspect of the pedicular shadow (target area). After negative aspiration for blood, 1 mL of the nerve block solution was injected without difficulty or complication. The needle was removed intact.  Procedural Needles: 22-gauge, 3.5-inch, Quincke needles used for all levels. Nerve block solution: 10 cc solution made of 8 cc of 0.2% ropivacaine, 2 cc of Decadron 10 mg/cc.  1 to 1.5 cc injected at each level above bilaterally.  The unused portion of the solution was discarded in the proper designated containers.  Once the entire procedure was completed, the treated area was cleaned, making sure to leave some of the prepping solution back to take advantage of its long term bactericidal properties.   Illustration of the posterior view of the lumbar spine and the posterior neural structures. Laminae of L2 through S1 are labeled. DPRL5, dorsal primary ramus of L5; DPRS1, dorsal primary ramus of S1; DPR3, dorsal primary ramus of L3; FJ, facet (zygapophyseal) joint L3-L4; I, inferior articular process of L4; LB1, lateral branch of dorsal primary ramus of L1; IAB, inferior articular branches from L3 medial branch (supplies L4-L5 facet joint); IBP, intermediate branch plexus; MB3, medial branch of dorsal primary ramus of L3; NR3, third lumbar nerve root; S, superior articular process of L5; SAB, superior articular branches from L4 (supplies L4-5 facet joint also); TP3, transverse process of L3.  Vitals:   01/10/20 1155 01/10/20 1159 01/10/20 1209 01/10/20 1219  BP: (!) 102/50 111/64 (!) 121/59 (!) 121/58  Pulse:    66  Resp: 16 18  16   Temp:      TempSrc:      SpO2: 98% 95% 96%    Weight:      Height:        Start Time: 1149 hrs. End Time: 1158 hrs.  Imaging Guidance (Spinal):  Type of Imaging Technique: Fluoroscopy Guidance (Spinal) Indication(s): Assistance in needle guidance and placement for procedures requiring needle placement in or near specific anatomical locations not easily accessible without such assistance. Exposure Time: Please see nurses notes. Contrast: None used. Fluoroscopic Guidance: I was personally present during the use of fluoroscopy. "Tunnel Vision Technique" used to obtain the best possible view of the target area. Parallax error corrected before commencing the procedure. "Direction-depth-direction" technique used to introduce the needle under continuous pulsed fluoroscopy. Once target was reached, antero-posterior, oblique, and lateral fluoroscopic projection used confirm needle placement in all planes. Images permanently stored in EMR. Interpretation: No contrast injected. I personally interpreted the imaging intraoperatively. Adequate needle placement confirmed in multiple planes. Permanent images saved into the patient's record.  Antibiotic Prophylaxis:   Anti-infectives (From admission, onward)   None     Indication(s): None identified  Post-operative Assessment:  Post-procedure Vital Signs:  Pulse/HCG Rate: 66 (nsr)64 (nsr) Temp: (!) 96.3 F (35.7 C) Resp: 16 BP: (!) 121/58 SpO2: 96 %  EBL: None  Complications: No immediate post-treatment complications observed by team, or reported by patient.  Note: The patient tolerated the entire procedure well. A repeat set of vitals were taken after the procedure and the patient was kept under observation following institutional policy, for this type of procedure. Post-procedural neurological assessment was performed, showing return to baseline, prior to discharge. The patient was provided with post-procedure discharge instructions, including a section on how to identify potential  problems. Should any problems arise concerning this procedure, the patient was given instructions to immediately contact us, at any time, without hesitation. In any case, we plan to contact the patient by telephone for a follow-up status report regarding this interventional procedure.  Comments:  No additional relevant information. 5 out of 5 strength bilateral lower extremity: Plantar flexion, dorsiflexion, knee flexion, knee extension. Plan of Care    Imaging Orders     DG PAIN [facility] C-ARM 1-60 MIN NO REPORT Procedure Orders    No procedure(s) ordered today    Medications ordered for procedure: Meds ordered this encounter  Medications  . lidocaine (XYLOCAINE) 2 % (with pres) injection 400 mg  . lactated ringers infusion 1,000 mL  . fentaNYL (SUBLIMAZE) injection 25-50 mcg    Make sure Narcan is available in the pyxis when using this medication. In the event of respiratory depression (RR< 8/min): Titrate NARCAN (naloxone) in increments of 0.1 to 0.2 mg IV at 2-3 minute intervals, until desired degree of reversal.  . ropivacaine (PF) 2 mg/mL (0.2%) (NAROPIN) injection 9 mL  . dexamethasone (DECADRON) injection 10 mg  . HYDROcodone-acetaminophen (NORCO) 5-325 MG tablet    Sig: Take 1 tablet by mouth every 6 (six) hours as needed for up to 8 days for moderate pain.    Dispense:  30 tablet    Refill:  0   Medications administered: We administered lidocaine, lactated ringers, fentaNYL, ropivacaine (PF) 2 mg/mL (0.2%), and dexamethasone.  See the medical record for exact dosing, route, and time of administration.  New Prescriptions   HYDROCODONE-ACETAMINOPHEN (NORCO) 5-325 MG TABLET    Take 1 tablet by mouth every 6 (six) hours as needed for up to 8 days for moderate pain.   Disposition: Discharge home  Discharge Date & Time: 01/10/2020; 1225 hrs.   Physician-requested Follow-up: Return in about 8 weeks (around 03/06/2020) for Post Procedure Evaluation, virtual.  Future Appointments   Date Time Provider [place]  02/02/2020 11:15 AM [facility]  02/03/2020 12:00 PM [place] [facility] SOCIAL WORK [place] [facility]  02/03/2020  3:20 PM [name], MD [facility] None  02/24/2020  1:00 PM [place] - [facility]   Primary Care Physician: Verl Bangs, FNP Location: [facility] Out[facility] Management [facility] Note by: [name], MD Date: 01/10/2020; Time: 12:43 PM  Disclaimer:  Medicine is not an exact science. The only guarantee in medicine is that nothing is guaranteed. It is important to note that the decision to proceed with this intervention was based on the information collected from the patient. The Data and conclusions were drawn from the patient's questionnaire, the interview, and the physical examination. Because the information was provided in large part by the patient, it cannot be guaranteed that it has not been purposely or unconsciously manipulated. Every effort has been made to obtain as much relevant data as possible for this evaluation. It is important to note that the conclusions that lead to this procedure are derived in large part from the available data. Always take into account that the treatment will also be dependent on availability of resources and existing treatment guidelines, considered by other Pain Management Practitioners as being common knowledge and practice, at the time of the intervention. For Medico-Legal purposes, it is also important to point out that variation in procedural techniques and pharmacological choices are the acceptable norm. The indications, contraindications, technique, and results of the above procedure should only be interpreted and judged by a Board-Certified Interventional Pain Specialist with extensive familiarity and expertise in the same exact procedure and technique.

## 2020-01-11 ENCOUNTER — Telehealth: Payer: Self-pay | Admitting: *Deleted

## 2020-01-11 NOTE — Telephone Encounter (Signed)
No problems post procedure. 

## 2020-01-13 ENCOUNTER — Other Ambulatory Visit: Payer: Self-pay

## 2020-01-13 DIAGNOSIS — E785 Hyperlipidemia, unspecified: Secondary | ICD-10-CM

## 2020-01-13 DIAGNOSIS — I69359 Hemiplegia and hemiparesis following cerebral infarction affecting unspecified side: Secondary | ICD-10-CM

## 2020-01-14 ENCOUNTER — Encounter: Payer: Self-pay | Admitting: Family Medicine

## 2020-01-14 ENCOUNTER — Ambulatory Visit: Payer: Self-pay

## 2020-01-14 ENCOUNTER — Other Ambulatory Visit: Payer: Self-pay

## 2020-01-14 ENCOUNTER — Ambulatory Visit (INDEPENDENT_AMBULATORY_CARE_PROVIDER_SITE_OTHER): Payer: Medicare HMO | Admitting: Family Medicine

## 2020-01-14 VITALS — BP 115/55 | HR 74 | Temp 97.7°F | Ht 65.0 in | Wt 160.4 lb

## 2020-01-14 DIAGNOSIS — R21 Rash and other nonspecific skin eruption: Secondary | ICD-10-CM | POA: Insufficient documentation

## 2020-01-14 DIAGNOSIS — E785 Hyperlipidemia, unspecified: Secondary | ICD-10-CM

## 2020-01-14 MED ORDER — ATORVASTATIN CALCIUM 40 MG PO TABS: ORAL_TABLET | ORAL | 1 refills | Status: DC

## 2020-01-14 MED ORDER — TRIAMCINOLONE ACETONIDE 0.5 % EX OINT: 1.0000 "application " | TOPICAL_OINTMENT | Freq: Two times a day (BID) | CUTANEOUS | 0 refills | Status: DC

## 2020-01-14 NOTE — Progress Notes (Signed)
Subjective:    Patient ID: Beth [name], female    DOB: 1945-06-25, 74 y.o.   MRN: [id number]  Beth [name] is a 74 y.o. female presenting on 01/14/2020 for Facial Swelling (Erythema, swelling under the eyes and cheek x 2 days. Pt state it burns and is tender to the touch. She had started using a new facial lotion approximately 2 weeks ago and discontinued it 2 days ago after her rash started)   HPI  Beth [name] presents to clinic with concerns of facial swelling, redness, tenderness and discomfort for the past 2 days.  Has been using a new facial lotion approximately 2 weeks ago and discontinued it approximately 2 days ago after her rash started.  Has not used this facial lotion in the past.  Denies anything that has made her symptoms better.  Denies any other acute concerns today.  Depression screen [facility] 2/9 07/27/2019 07/09/2019 03/23/2019  Decreased Interest 0 1 0  Down, Depressed, Hopeless 0 1 1  PHQ - 2 Score 0 2 1  Altered sleeping - 0 -  Tired, decreased energy - 0 -  Change in appetite - 0 -  Feeling bad or failure about yourself  - 1 -  Trouble concentrating - 0 -  Moving slowly or fidgety/restless - 0 -  Suicidal thoughts - 0 -  PHQ-9 Score - 3 -  Difficult doing work/chores - Somewhat difficult -  Some recent data might be hidden    Social History   Tobacco Use  . Smoking status: Current Every Day Smoker    Packs/day: 0.25    Years: 57.00    Pack years: 14.25    Types: Cigarettes  . Smokeless tobacco: Never Used  . Tobacco comment: .5ppd currently  Vaping Use  . Vaping Use: Never used  Substance Use Topics  . Alcohol use: No    Alcohol/week: 0.0 standard drinks  . Drug use: No    Review of Systems  Constitutional: Negative.   HENT: Negative.   Eyes: Negative.   Respiratory: Negative.   Cardiovascular: Negative.   Gastrointestinal: Negative.   Endocrine: Negative.   Genitourinary: Negative.   Musculoskeletal: Negative.   Skin: Positive for  rash.  Allergic/Immunologic: Negative.   Neurological: Negative.   Hematological: Negative.   Psychiatric/Behavioral: Negative.    Per HPI unless specifically indicated above     Objective:    BP (!) 115/55 (BP Location: Right Arm, Patient Position: Sitting, Cuff Size: Normal)   Pulse 74   Temp 97.7 F (36.5 C) (Temporal)   Ht 5\' 5"  (1.651 m)   Wt 160 lb 6.4 oz (72.8 kg)   BMI 26.69 kg/m   Wt Readings from Last 3 Encounters:  01/14/20 160 lb 6.4 oz (72.8 kg)  01/10/20 160 lb (72.6 kg)  09/29/19 157 lb (71.2 kg)    Physical Exam Vitals and nursing note reviewed.  Constitutional:      General: She is not in acute distress.    Appearance: Normal appearance. She is well-developed and well-groomed. She is not ill-appearing or toxic-appearing.  HENT:     Head: Normocephalic and atraumatic.     Nose:     Comments: Beth [name] is in place, covering mouth and nose. Eyes:     General: Lids are normal. Vision grossly intact.        Right eye: No discharge.        Left eye: No discharge.     Extraocular Movements: Extraocular movements intact.  Conjunctiva/sclera: Conjunctivae normal.     Pupils: Pupils are equal, round, and reactive to light.  Cardiovascular:     Rate and Rhythm: Normal rate and regular rhythm.     Pulses: Normal pulses.     Heart sounds: Normal heart sounds. No murmur heard. No friction rub. No gallop.   Pulmonary:     Effort: Pulmonary effort is normal. No respiratory distress.     Breath sounds: Normal breath sounds.  Skin:    General: Skin is warm and dry.     Capillary Refill: Capillary refill takes less than 2 seconds.     Findings: Rash present.     Comments: Facial rash, noted along bridge of nose and bilateral facial cheeks, butterfly like in appearance.  Possible contact dermatitis.  Neurological:     General: No focal deficit present.     Mental Status: She is alert and oriented to person, place, and time.  Psychiatric:        Attention and  Perception: Attention and perception normal.        Mood and Affect: Mood and affect normal.        Speech: Speech normal.        Behavior: Behavior normal. Behavior is cooperative.        Thought Content: Thought content normal.        Cognition and Memory: Cognition and memory normal.        Judgment: Judgment normal.    Results for orders placed or performed in visit on 09/09/19  Pulmonary Function Test [facility] Only  Result Value Ref Range   FVC-%Pred-Pre 66 %   FVC-Post 1.88 L   FVC-%Pred-Post 63 %   FVC-%Change-Post -3 %   FEV1-Pre 1.28 L   FEV1-%Pred-Pre 58 %   FEV1-Post 1.13 L   FEV1-%Pred-Post 51 %   FEV1-%Change-Post -12 %   FEV6-Pre 1.94 L   FEV6-%Pred-Pre 69 %   FEV6-Post 1.83 L   FEV6-%Pred-Post 65 %   FEV6-%Change-Post -5 %   Pre FEV1/FVC ratio 66 %   FEV1FVC-%Pred-Pre 87 %   Post FEV1/FVC ratio 60 %   FEV1FVC-%Change-Post -8 %   Pre FEV6/FVC Ratio 99 %   FEV6FVC-%Pred-Pre 104 %   Post FEV6/FVC ratio 97 %   FEV6FVC-%Pred-Post 102 %   FEV6FVC-%Change-Post -2 %   FEF 25-75 Pre 0.71 L/sec   FEF2575-%Pred-Pre 41 %   FEF 25-75 Post 0.46 L/sec   FEF2575-%Pred-Post 26 %   FEF2575-%Change-Post -35 %   RV 2.55 L   RV % pred 109 %   TLC 4.20 L   TLC % pred 80 %   DLCO unc 7.90 ml/min/mmHg   DLCO unc % pred 39 %   DL/VA 2.05 ml/min/mmHg/L   DL/VA % pred 50 %      Assessment & Plan:   Problem List Items Addressed This Visit      Musculoskeletal and Integument   Rash - Primary    Likely contact dermatitis based on recent change to lotion used on face, but is presenting as butterfly rash in appearance.  Will have labs drawn for evaluation of ANA, RH and CCP, but will treat topically with triamcinolone cream BID.  Advised to avoid the topical facial lotion, as likely is contributing to contact dermatitis.  Will have labs drawn today.      Relevant Medications   triamcinolone ointment (KENALOG) 0.5 %   Other Relevant Orders   CBC with Differential   COMPLETE  METABOLIC PANEL WITH  GFR   Antinuclear Antib (ANA)   Cyclic citrul peptide antibody, IgG   Rheumatoid factor     Other   Dyslipidemia    Refill on Atorvastatin needing, will send to pharmacy on file.      Relevant Medications   atorvastatin (LIPITOR) 40 MG tablet      Meds ordered this encounter  Medications  . atorvastatin (LIPITOR) 40 MG tablet    Sig: TAKE 1 TABLET BY MOUTH EVERY DAY    Dispense:  90 tablet    Refill:  1  . triamcinolone ointment (KENALOG) 0.5 %    Sig: Apply 1 application topically 2 (two) times daily.    Dispense:  30 g    Refill:  0   Follow up plan: Return if symptoms worsen or fail to improve.   Harlin Rain, [place] [facility] Nurse Practitioner [facility] 01/14/2020, 12:22 PM

## 2020-01-14 NOTE — Assessment & Plan Note (Signed)
Likely contact dermatitis based on recent change to lotion used on face, but is presenting as butterfly rash in appearance.  Will have labs drawn for evaluation of ANA, RH and CCP, but will treat topically with triamcinolone cream BID.  Advised to avoid the topical facial lotion, as likely is contributing to contact dermatitis.  Will have labs drawn today.

## 2020-01-14 NOTE — Telephone Encounter (Signed)
Pt. States she noticed puffiness across her cheeks 2 days ago. Nose is red and painful. 7/10 pain scale. Has not used any new skin products or eaten anything different. No difficulty swallowing or shortness of breath. Appointment made for today.  Reason for Disposition . Swelling is painful to touch  Answer Assessment - Initial Assessment Questions 1. ONSET: "When did the swelling start?" (e.g., minutes, hours, days)     2 days ago 2. LOCATION: "What part of the face is swollen?"     Cheeks 3. SEVERITY: "How swollen is it?"     Mild - moderate 4. ITCHING: "Is there any itching?" If Yes, ask: "How much?"   (Scale 1-10; mild, moderate or severe)     No 5. PAIN: "Is the swelling painful to touch?" If Yes, ask: "How painful is it?"   (Scale 1-10; mild, moderate or severe)     Nose - 7 6. FEVER: "Do you have a fever?" If Yes, ask: "What is it, how was it measured, and when did it start?"      No 7. CAUSE: "What do you think is causing the face swelling?"     Allergy 8. RECURRENT SYMPTOM: "Have you had face swelling before?" If Yes, ask: "When was the last time?" "What happened that time?"     nO 9. OTHER SYMPTOMS: "Do you have any other symptoms?" (e.g., toothache, leg swelling)     No 10. PREGNANCY: "Is there any chance you are pregnant?" "When was your last menstrual period?"       No  Protocols used: [facility]

## 2020-01-14 NOTE — Assessment & Plan Note (Signed)
Refill on Atorvastatin needing, will send to pharmacy on file.

## 2020-01-14 NOTE — Telephone Encounter (Signed)
OV 01/14/2020

## 2020-01-14 NOTE — Patient Instructions (Signed)
Have your labs drawn and we will contact you with the results  I have sent in a prescription for triamcinolone cream to apply 1 application 2x daily to the skin.  STOP the new face cream that you have been using and avoid restarting after the rash has cleared up  We will plan to see you back if your symptoms worsen or fail to improve  You will receive a survey after today's visit either digitally by e-mail or paper by USPS mail. Your experiences and feedback matter to [place].  Please respond so we know how we are doing as we provide care for you.  Call us with any questions/concerns/needs.  It is my goal to be available to you for your health concerns.  Thanks for choosing me to be a partner in your healthcare needs!  Harlin Rain, FNP-C Family Nurse Practitioner [facility] Phone: [phone]

## 2020-01-17 LAB — CBC WITH DIFFERENTIAL/PLATELET
Absolute Monocytes: 684 cells/uL (ref 200–950)
Basophils Absolute: 27 cells/uL (ref 0–200)
Basophils Relative: 0.3 %
Eosinophils Absolute: 216 cells/uL (ref 15–500)
Eosinophils Relative: 2.4 %
HCT: 38.2 % (ref 35.0–45.0)
Hemoglobin: 12.5 g/dL (ref 11.7–15.5)
Lymphs Abs: 1584 cells/uL (ref 850–3900)
MCH: 29.3 pg (ref 27.0–33.0)
MCHC: 32.7 g/dL (ref 32.0–36.0)
MCV: 89.5 fL (ref 80.0–100.0)
MPV: 9.9 fL (ref 7.5–12.5)
Monocytes Relative: 7.6 %
Neutro Abs: 6489 cells/uL (ref 1500–7800)
Neutrophils Relative %: 72.1 %
Platelets: 274 10*3/uL (ref 140–400)
RBC: 4.27 10*6/uL (ref 3.80–5.10)
RDW: 14.4 % (ref 11.0–15.0)
Total Lymphocyte: 17.6 %
WBC: 9 10*3/uL (ref 3.8–10.8)

## 2020-01-17 LAB — COMPLETE METABOLIC PANEL WITH GFR
AG Ratio: 1.6 (calc) (ref 1.0–2.5)
ALT: 27 U/L (ref 6–29)
AST: 23 U/L (ref 10–35)
Albumin: 3.9 g/dL (ref 3.6–5.1)
Alkaline phosphatase (APISO): 108 U/L (ref 37–153)
BUN/Creatinine Ratio: 28 (calc) — ABNORMAL HIGH (ref 6–22)
BUN: 28 mg/dL — ABNORMAL HIGH (ref 7–25)
CO2: 32 mmol/L (ref 20–32)
Calcium: 9.5 mg/dL (ref 8.6–10.4)
Chloride: 103 mmol/L (ref 98–110)
Creat: 1 mg/dL — ABNORMAL HIGH (ref 0.60–0.93)
GFR, Est African American: 63 mL/min/{1.73_m2} (ref >=60)
GFR, Est Non African American: 55 mL/min/{1.73_m2} — ABNORMAL LOW (ref >=60)
Globulin: 2.4 g/dL (calc) (ref 1.9–3.7)
Glucose, Bld: 100 mg/dL — ABNORMAL HIGH (ref 65–99)
Potassium: 4 mmol/L (ref 3.5–5.3)
Sodium: 143 mmol/L (ref 135–146)
Total Bilirubin: 0.6 mg/dL (ref 0.2–1.2)
Total Protein: 6.3 g/dL (ref 6.1–8.1)

## 2020-01-17 LAB — ANA: Anti Nuclear Antibody (ANA): NEGATIVE

## 2020-01-17 LAB — CYCLIC CITRUL PEPTIDE ANTIBODY, IGG: Cyclic Citrullin Peptide Ab: <16 UNITS

## 2020-01-17 LAB — RHEUMATOID FACTOR: Rheumatoid fact SerPl-aCnc: <14 IU/mL (ref <14)

## 2020-02-02 ENCOUNTER — Ambulatory Visit: Payer: Self-pay | Admitting: Pharmacist

## 2020-02-02 DIAGNOSIS — I10 Essential (primary) hypertension: Secondary | ICD-10-CM

## 2020-02-02 DIAGNOSIS — J449 Chronic obstructive pulmonary disease, unspecified: Secondary | ICD-10-CM

## 2020-02-02 NOTE — Chronic Care Management (AMB) (Signed)
Chronic Care Management   Follow Up Note   02/02/2020 Name: Beth [name] MRN: [id number] DOB: Jun 08, 1945  [name], FNP Reason for referral : No chief complaint on file.   Beth [name] is a 75 y.o. year old female who is a primary care patient of Anitra Lauth, Jodelle Gross, FNP. The CCM team was consulted for assistance with chronic disease management and care coordination needs.    I reached out to Leland Johns by phone today.   Review of patient status, including review of consultants reports, relevant laboratory and other test results, and collaboration with appropriate care team members and the patient's provider was performed as part of comprehensive patient evaluation and provision of chronic care management services.    SDOH (Social Determinants of Health) assessments performed: No See Care Plan activities for detailed interventions related to SDOH)    Objective  Lab Results  Component Value Date   CREATININE 1.00 (H) 01/14/2020   BUN 28 (H) 01/14/2020   NA 143 01/14/2020   K 4.0 01/14/2020   CL 103 01/14/2020   CO2 32 01/14/2020  Calculated CrCl (based on adjusted body weight):~48 mL/min  BP Readings from Last 3 Encounters:  01/14/20 (!) 115/55  01/10/20 (!) 121/58  09/14/19 122/62   Lab Results  Component Value Date   LDLCALC 64 04/10/2018    Outpatient Encounter Medications as of 02/02/2020  Medication Sig Note  . Fluticasone-Umeclidin-Vilant (TRELEGY ELLIPTA) 200-62.5-25 MCG/INH AEPB Inhale 1 puff into the lungs daily.   . furosemide (LASIX) 20 MG tablet Take 1 tablet (20 mg total) by mouth daily.   . metoprolol succinate (TOPROL-XL) 25 MG 24 hr tablet TAKE 1/2 TABLET(12.5 MG) BY MOUTH DAILY   . acetaminophen (TYLENOL) 500 MG tablet Take 500 mg by mouth 2 (two) times daily as needed.    Marland Kitchen aspirin 81 MG chewable tablet Chew 81 mg daily by mouth.   Marland Kitchen atorvastatin (LIPITOR) 40 MG tablet TAKE 1 TABLET BY MOUTH EVERY DAY   . azelastine  (ASTELIN) 0.1 % nasal spray Place 2 sprays into both nostrils 2 (two) times daily.   . clonazePAM (KLONOPIN) 0.5 MG tablet TK 1 T PO DAILY PRN   . COMBIVENT RESPIMAT 20-100 MCG/ACT AERS respimat INHALE 1 PUFF BY MOUTH EVERY 6 HOURS   . FLUoxetine (PROZAC) 20 MG tablet Take 60 mg by mouth daily.    Marland Kitchen gabapentin (NEURONTIN) 300 MG capsule Take 2 capsules (600 mg total) by mouth 3 (three) times daily. 02/02/2020: Reports taking 2 capsules (600 mg) twice daily  . hydrOXYzine (ATARAX/VISTARIL) 25 MG tablet Take 25-50 mg by mouth daily as needed. (Patient not taking: Reported on 01/14/2020)   . Melatonin 3 MG CAPS Take 10 mg by mouth at bedtime as needed (sleep).    . montelukast (SINGULAIR) 10 MG tablet TAKE 1 TABLET(10 MG) BY MOUTH AT BEDTIME   . SEROQUEL 100 MG tablet Take 100 mg by mouth at bedtime.   . triamcinolone ointment (KENALOG) 0.5 % Apply 1 application topically 2 (two) times daily.    Facility-Administered Encounter Medications as of 02/02/2020  Medication  . albuterol (PROVENTIL) (2.5 MG/3ML) 0.083% nebulizer solution 2.5 mg    Goals Addressed            This Visit's Progress   . PharmD - Medication Management       CARE PLAN ENTRY (see longtitudinal plan of care for additional care plan information)   Current Barriers:  . Chronic Disease  Management support, education, and care coordination needs related to previous CVA, COPD, hypertension, hyperlipidemia, depression  Pharmacist Clinical Goal(s):  Marland Kitchen Over the next 30 days, patient will work with CM Pharmacist to complete medication review address needs identified  Interventions: . Perform chart review. Patient seen by PCP on 12/17 for facial swelling/rash since started using a new facial lotion . Follow up with patient today regarding rash. Reports has stopped using the facial lotion, has been using the triamcinolone ointment as directed by PCP and rash is resolved . From review of chart, note pain management provider  increased gabapentin Rx to gabapentin 2 capsules (600 mg) three times daily on 11/16 o Patient reports that she has continued to take previous dose of 2 capsules (600 mg) twice daily - Patient denies dizziness sedation with current dose and confirms will use caution if increases gabapentin to three times daily as directed by Pain Management provider o Note for CrCl between 30-59 mL/min, labeling recommends maximum of 1400 mg of gabapentin/day. Have collaborated with Pain Management regarding this recommendation.  Hypertension . Reports taking: ? Furosemide 20 mg daily ? Metoprolol ER 25 mg - 1/2 tablet (12.5 mg ) daily . Note patient has home upper BP monitor . Reports last check BP last week, reading: 110/67  . Encourage patient to monitor, keep log of BP and bring record to medical appointments  Medication Adherence  . Reports using weekly pillbox as adherence aid . Reports using Trelegy inhaler, 1 puff daily, as directed (and rinsing out mouth after use). Confirmed continuing to use rescue (Combivent) inhaler as needed as directed . Identifies in need of refill of quetiapine Rx from psychiatrist ? Reports has appointment with new psychiatrist in [place] on 02/21/2020, as referred by previous psychiatrist from [place] ? Patient reports will call psychiatrist from [facility] to request refill to last until appointment with new provider . Patient denies further medication questions/concerns today. Advise patient to call office for further medical questions/concerns  Patient Self Care Activities:  . To self administers medications as prescribed o Encourage patient to obtain weekly pillbox to use as adherence tool . Attends all scheduled provider appointments o Appointment with new psychiatrist on 02/21/2020 . Calls pharmacy for medication refills . Calls provider office for new concerns or questions  Please see past updates related to this goal by clicking on the "Past Updates" button in  the selected goal         Plan  Telephone follow up appointment with care management team member scheduled for: 03/27/2020 at 11:15 am  [name], PharmD, [place] [phone]

## 2020-02-02 NOTE — Progress Notes (Unsigned)
Attempted to call for pre appointment review of allergies/meds. Message left. 

## 2020-02-02 NOTE — Patient Instructions (Signed)
Thank you allowing the Chronic Care Management Team to be a part of your care! It was a pleasure speaking with you today!     CCM (Chronic Care Management) Team    Alto Denver RN, MSN, CCM Nurse Care Coordinator  [phone]   Duanne Moron PharmD  Clinical Pharmacist  [phone]   Dickie La LCSW Clinical Social Worker [phone]  Visit Information  Goals Addressed            This Visit's Progress   . PharmD - Medication Management       CARE PLAN ENTRY (see longtitudinal plan of care for additional care plan information)   Current Barriers:  . Chronic Disease Management support, education, and care coordination needs related to previous CVA, COPD, hypertension, hyperlipidemia, depression  Pharmacist Clinical Goal(s):  Marland Kitchen Over the next 30 days, patient will work with CM Pharmacist to complete medication review address needs identified  Interventions: . Perform chart review. Patient seen by PCP on 12/17 for facial swelling/rash since started using a new facial lotion . Follow up with patient today regarding rash. Reports has stopped using the facial lotion, has been using the triamcinolone ointment as directed by PCP and rash is resolved . From review of chart, note pain management provider increased gabapentin Rx to gabapentin 2 capsules (600 mg) three times daily on 11/16 o Patient reports that she has continued to take previous dose of 2 capsules (600 mg) twice daily - Patient denies dizziness sedation with current dose and confirms will use caution if increases gabapentin to three times daily as directed by Pain Management provider o Note for CrCl between 30-59 mL/min, labeling recommends maximum of 1400 mg of gabapentin/day. Have collaborated with Pain Management regarding this recommendation.  Hypertension . Reports taking: ? Furosemide 20 mg daily ? Metoprolol ER 25 mg - 1/2 tablet (12.5 mg ) daily . Note patient has home upper BP monitor . Reports  last check BP last week, reading: 110/67  . Encourage patient to monitor, keep log of BP and bring record to medical appointments  Medication Adherence  . Reports using weekly pillbox as adherence aid . Reports using Trelegy inhaler, 1 puff daily, as directed (and rinsing out mouth after use). Confirmed continuing to use rescue (Combivent) inhaler as needed as directed . Identifies in need of refill of quetiapine Rx from psychiatrist ? Reports has appointment with new psychiatrist in [place] on 02/21/2020, as referred by previous psychiatrist from [place] ? Patient reports will call psychiatrist from [facility] to request refill to last until appointment with new provider . Patient denies further medication questions/concerns today. Advise patient to call office for further medical questions/concerns  Patient Self Care Activities:  . To self administers medications as prescribed o Encourage patient to obtain weekly pillbox to use as adherence tool . Attends all scheduled provider appointments o Appointment with new psychiatrist on 02/21/2020 . Calls pharmacy for medication refills . Calls provider office for new concerns or questions  Please see past updates related to this goal by clicking on the "Past Updates" button in the selected goal         The patient verbalized understanding of instructions, educational materials, and care plan provided today and declined offer to receive copy of patient instructions, educational materials, and care plan.   Telephone follow up appointment with care management team member scheduled for:  03/27/2020 at 11:15 am  Duanne Moron, PharmD, [facility] Clinical Pharmacist [facility] [employer] [phone]

## 2020-02-03 ENCOUNTER — Ambulatory Visit
Payer: Medicare HMO | Attending: Student in an Organized Health Care Education/Training Program | Admitting: Student in an Organized Health Care Education/Training Program

## 2020-02-03 ENCOUNTER — Other Ambulatory Visit: Payer: Self-pay

## 2020-02-03 ENCOUNTER — Telehealth: Payer: Self-pay | Admitting: Licensed Clinical Social Worker

## 2020-02-03 ENCOUNTER — Telehealth: Payer: Self-pay

## 2020-02-03 ENCOUNTER — Encounter: Payer: Self-pay | Admitting: Student in an Organized Health Care Education/Training Program

## 2020-02-03 DIAGNOSIS — M47816 Spondylosis without myelopathy or radiculopathy, lumbar region: Secondary | ICD-10-CM | POA: Diagnosis not present

## 2020-02-03 DIAGNOSIS — G894 Chronic pain syndrome: Secondary | ICD-10-CM

## 2020-02-03 NOTE — Progress Notes (Signed)
Patient: Beth [name]  Service Category: E/M  Provider: [name], MD  DOB: [birth year]  DOS: 02/03/2020  Location: Office  MRN: [id number]  Setting: Ambulatory outpatient  Referring Provider: Verl Bangs, FNP  Type: Established Patient  Specialty: Interventional Pain Management  PCP: Verl Bangs, FNP  Location: Home  Delivery: TeleHealth     Virtual Encounter - Pain Management PROVIDER NOTE: Information contained herein reflects review and annotations entered in association with encounter. Interpretation of such information and data should be left to medically-trained personnel. Information provided to patient can be located elsewhere in the medical record under "Patient Instructions". Document created using STT-dictation technology, any transcriptional errors that may result from process are unintentional.    Contact & Pharmacy Preferred: [phone] Home: [phone] (home) Mobile: [phone] (mobile) E-mail: No e-mail address on record  [address], [place] [address] Phone: [phone] Fax: [phone]   Pre-screening  Beth [name] offered "in-person" vs "virtual" encounter. She indicated preferring virtual for this encounter.   Reason COVID-19*  Social distancing based on CDC and AMA recommendations.   I contacted Beth [name] on 02/03/2020 via telephone.      I clearly identified myself as [name], MD. I verified that I was speaking with the correct person using two identifiers (Name: Beth [name], and date of birth: 1945-07-05).  Consent I sought verbal advanced consent from Beth [name] for virtual visit interactions. I informed Beth [name] of possible security and privacy concerns, risks, and limitations associated with providing "not-in-person" medical evaluation and management services. I also informed Beth [name] of the availability of  "in-person" appointments. Finally, I informed her that there would be a charge for the virtual visit and that she could be  personally, fully or partially, financially responsible for it. Beth [name] expressed understanding and agreed to proceed.   Historic Elements   Beth [name] is a 75 y.o. year old, female patient evaluated today after our last contact on 01/10/2020. Beth [name]  has a past medical history of Allergy, Anxiety, Asthma, Chronic pain syndrome, COPD (chronic obstructive pulmonary disease) ([place]), CVA (cerebral infarction), Depression, Fibromyalgia, Headache, Hyperlipidemia, Hypertension, IBS (irritable bowel syndrome), Stroke ([place]), and Vitamin D deficiency. She also  has a past surgical history that includes Breast surgery (2011); Cervical discectomy; Appendectomy; Abdominal hysterectomy; Cholecystectomy; Sinusotomy; and Breast excisional biopsy (2011). Beth [name] has a current medication list which includes the following prescription(s): acetaminophen, aspirin, atorvastatin, azelastine, clonazepam, combivent respimat, fluoxetine, trelegy ellipta, furosemide, gabapentin, hydroxyzine, melatonin, metoprolol succinate, montelukast, seroquel, and triamcinolone ointment, and the following Facility-Administered Medications: albuterol. She  reports that she has been smoking cigarettes. She has a 14.25 pack-year smoking history. She has never used smokeless tobacco. She reports that she does not drink alcohol and does not use drugs. Beth [name] is allergic to bextra  [valdecoxib], compazine [prochlorperazine edisylate], lithium carbonate, and lyrica [pregabalin].   HPI  Today, she is being contacted for a post-procedure assessment.    Post-Procedure Evaluation  Procedure (01/10/2020):   Type: Lumbar Facet, Medial Branch Block(s) #2  Primary Purpose: Therapeutic Region: Posterolateral Lumbosacral Spine Level: L3, L4, L5, Medial Branch Level(s). Injecting these levels blocks  the L3-4, L4-5,  lumbar facet joints. Laterality: Bilateral  Sedation: Please see nurses note.  Effectiveness during initial hour after procedure(Ultra-Short Term Relief): 100%  Local anesthetic used: Long-acting (4-6 hours) Effectiveness:  Defined as any analgesic benefit obtained secondary to the administration of local anesthetics. This carries significant diagnostic value as to the etiological location, or anatomical origin, of the pain. Duration of benefit is expected to coincide with the duration of the local anesthetic used.  Effectiveness during initial 4-6 hours after procedure(Short-Term Relief): 100%  Long-term benefit: Defined as any relief past the pharmacologic duration of the local anesthetics.  Effectiveness past the initial 6 hours after procedure(Long-Term Relief): 80% for the first 3 weeks now 75%  Current benefits: Defined as benefit that persist at this time.   Analgesia:  >75% relief Function: Beth [name] reports improvement in function ROM: Beth [name] reports improvement in ROM  UDS:  Summary  Date Value Ref Range Status  02/24/2017 FINAL  Final    Comment:    ==================================================================== TOXASSURE COMP DRUG ANALYSIS,UR ==================================================================== Test                             Result       Flag       Units Drug Present and Declared for Prescription Verification   Gabapentin                     PRESENT      EXPECTED   Aripiprazole                   PRESENT      EXPECTED   Metoprolol                     PRESENT      EXPECTED Drug Present not Declared for Prescription Verification   7-aminoclonazepam              368          UNEXPECTED ng/mg creat    7-aminoclonazepam is an expected metabolite of clonazepam. Source    of clonazepam is a scheduled prescription medication.   Citalopram                     PRESENT      UNEXPECTED   Desmethylcitalopram            PRESENT       UNEXPECTED    Desmethylcitalopram is an expected metabolite of citalopram or    the enantiomeric form, escitalopram.   Hydroxyzine                    PRESENT      UNEXPECTED Drug Absent but Declared for Prescription Verification   Duloxetine                     Not Detected UNEXPECTED   Acetaminophen                  Not Detected UNEXPECTED    Acetaminophen, as indicated in the declared medication list, is    not always detected even when used as directed.   Salicylate                     Not Detected UNEXPECTED    Aspirin, as indicated in the declared medication list, is not    always detected even when used as directed. ==================================================================== Test                      Result    Flag   Units  Ref Range   Creatinine              62               mg/dL      >=20 ==================================================================== Declared Medications:  The flagging and interpretation on this report are based on the  following declared medications.  Unexpected results may arise from  inaccuracies in the declared medications.  **Note: The testing scope of this panel includes these medications:  Aripiprazole  Duloxetine  Gabapentin  Metoprolol  **Note: The testing scope of this panel does not include small to  moderate amounts of these reported medications:  Acetaminophen  Aspirin (Aspirin 81)  **Note: The testing scope of this panel does not include following  reported medications:  Albuterol (Ipratropium-Albuterol)  Atorvastatin  Azelastine  Fluticasone  Fluticasone (Breo)  Ipratropium (Ipratropium-Albuterol)  Multivitamin (MVI)  Vilanterol (Breo) ==================================================================== For clinical consultation, please call [phone]. ====================================================================     Laboratory Chemistry Profile   Renal Lab Results  Component Value Date   BUN 28  (H) 01/14/2020   CREATININE 1.00 (H) 01/14/2020   BCR 28 (H) 01/14/2020   GFRAA 63 01/14/2020   GFRNONAA 55 (L) 01/14/2020     Hepatic Lab Results  Component Value Date   AST 23 01/14/2020   ALT 27 01/14/2020   ALBUMIN 3.9 08/17/2019   ALKPHOS 98 08/17/2019   LIPASE 27 02/13/2016     Electrolytes Lab Results  Component Value Date   NA 143 01/14/2020   K 4.0 01/14/2020   CL 103 01/14/2020   CALCIUM 9.5 01/14/2020   MG 1.8 12/25/2012     Bone Lab Results  Component Value Date   VD25OH 37 03/21/2016     Inflammation (CRP: Acute Phase) (ESR: Chronic Phase) Lab Results  Component Value Date   CRP 0.3 03/21/2016   ESRSEDRATE 1 03/21/2016   LATICACIDVEN 1.3 12/23/2015       Note: Above Lab results reviewed.   Assessment  The primary encounter diagnosis was Lumbar facet arthropathy. Diagnoses of Lumbar spondylosis and Chronic pain syndrome were also pertinent to this visit.  Plan of Care  [name] has a current medication list which includes the following long-term medication(s): atorvastatin, azelastine, combivent respimat, fluoxetine, furosemide, gabapentin, metoprolol succinate, and montelukast.  Orders:  Orders Placed This Encounter  Procedures  . LUMBAR FACET(MEDIAL BRANCH NERVE BLOCK) MBNB    Scheduling timeframe: (PRN procedure) Beth [name] will call when needed. Clinical indication: Axial low back pain. Lumbosacral Spondylosis (M47.897).  Sedation: Usually done with sedation. (May be done without sedation if so desired by patient.) Requirements: NPO x 8 hrs.; Driver; Stop blood thinners. Interval: No sooner than two weeks for diagnostic or therapeutic. No sooner than every other month for palliative.    Standing Status:   Standing    Number of Occurrences:   5    Standing Expiration Date:   02/02/2021    Scheduling Instructions:     Procedure: Lumbar facet block (AKA.: Lumbosacral medial branch nerve block)     Level: L3-4, L4-5,  Facets ( L3,  L4, L5,  Medial Branch Nerves)     Laterality: Bilateral    Order Specific Question:   Where will this procedure be performed?    Answer:   [facility]   Follow-up plan:   Return if symptoms worsen or fail to improve.     Status post bilateral facet medial branch nerve blocks L3, L4, L5 on 09/03/2017,  helpful for axial low back and buttock pain: Repeat PRN.  Status post right L4-L5 epidural steroid injection on 03/09/2018, 07/29/2018 which was helpful for her right-sided hip and leg pain.  Right L4-L5 ESI #3 on 09/23/2027 not as helpful.  Right L3, L4, L5 RFA on 07/07/2019         Recent Visits Date Type Provider Dept  01/10/20 Procedure visit [name], MD [facility]  12/14/19 Telemedicine [name], MD [facility]  Showing recent visits within past 90 days and meeting all other requirements Today's Visits Date Type Provider Dept  02/03/20 Telemedicine [name], MD [facility]  Showing today's visits and meeting all other requirements Future Appointments No visits were found meeting these conditions. Showing future appointments within next 90 days and meeting all other requirements  I discussed the assessment and treatment plan with the patient. The patient was provided an opportunity to ask questions and all were answered. The patient agreed with the plan and demonstrated an understanding of the instructions.  Patient advised to call back or seek an in-person evaluation if the symptoms or condition worsens.  Duration of encounter: 20 minutes.  Note by: [name], MD Date: 02/03/2020; Time: 10:24 AM

## 2020-02-03 NOTE — Telephone Encounter (Signed)
  Chronic Care Management    Clinical Social Work General Follow Up Note  02/03/2020 Name: Beth [name] MRN: [id number] DOB: 11-Sep-1945  Beth [name] is a 75 y.o. year old female who is a primary care patient of Anitra Lauth, Jodelle Gross, FNP. The CCM team was consulted for assistance with Mental Health Counseling and Resources.   Review of patient status, including review of consultants reports, relevant laboratory and other test results, and collaboration with appropriate care team members and the patient's provider was performed as part of comprehensive patient evaluation and provision of chronic care management services.    LCSW completed CCM outreach attempt today but was unable to reach patient successfully. A HIPPA compliant voice message was left encouraging patient to return call once available. LCSW will ask Scheduling Care Guide to reschedule CCM SW appointment with patient as well.  Outpatient Encounter Medications as of 02/03/2020  Medication Sig Note  . acetaminophen (TYLENOL) 500 MG tablet Take 500 mg by mouth 2 (two) times daily as needed.    Marland Kitchen aspirin 81 MG chewable tablet Chew 81 mg daily by mouth.   Marland Kitchen atorvastatin (LIPITOR) 40 MG tablet TAKE 1 TABLET BY MOUTH EVERY DAY   . azelastine (ASTELIN) 0.1 % nasal spray Place 2 sprays into both nostrils 2 (two) times daily.   . clonazePAM (KLONOPIN) 0.5 MG tablet TK 1 T PO DAILY PRN   . COMBIVENT RESPIMAT 20-100 MCG/ACT AERS respimat INHALE 1 PUFF BY MOUTH EVERY 6 HOURS   . FLUoxetine (PROZAC) 20 MG tablet Take 60 mg by mouth daily.    . Fluticasone-Umeclidin-Vilant (TRELEGY ELLIPTA) 200-62.5-25 MCG/INH AEPB Inhale 1 puff into the lungs daily.   . furosemide (LASIX) 20 MG tablet Take 1 tablet (20 mg total) by mouth daily.   Marland Kitchen gabapentin (NEURONTIN) 300 MG capsule Take 2 capsules (600 mg total) by mouth 3 (three) times daily. 02/02/2020: Reports taking 2 capsules (600 mg) twice daily  . hydrOXYzine (ATARAX/VISTARIL) 25 MG tablet Take 25-50 mg  by mouth daily as needed. (Patient not taking: Reported on 01/14/2020)   . Melatonin 3 MG CAPS Take 10 mg by mouth at bedtime as needed (sleep).    . metoprolol succinate (TOPROL-XL) 25 MG 24 hr tablet TAKE 1/2 TABLET(12.5 MG) BY MOUTH DAILY   . montelukast (SINGULAIR) 10 MG tablet TAKE 1 TABLET(10 MG) BY MOUTH AT BEDTIME   . SEROQUEL 100 MG tablet Take 100 mg by mouth at bedtime.   . triamcinolone ointment (KENALOG) 0.5 % Apply 1 application topically 2 (two) times daily.    Facility-Administered Encounter Medications as of 02/03/2020  Medication  . albuterol (PROVENTIL) (2.5 MG/3ML) 0.083% nebulizer solution 2.5 mg    Follow Up Plan: Scheduling Care Guide will reach out to patient to reschedule appointment.   Dickie La, BSW, MSW, LCSW [facility] [place]  [facility] [email or web address] Phone: [phone]

## 2020-02-09 NOTE — Telephone Encounter (Signed)
Patient has been rescheduled.

## 2020-02-17 ENCOUNTER — Ambulatory Visit: Payer: Medicare HMO | Admitting: Licensed Clinical Social Worker

## 2020-02-17 DIAGNOSIS — F332 Major depressive disorder, recurrent severe without psychotic features: Secondary | ICD-10-CM

## 2020-02-17 DIAGNOSIS — F411 Generalized anxiety disorder: Secondary | ICD-10-CM

## 2020-02-17 DIAGNOSIS — I10 Essential (primary) hypertension: Secondary | ICD-10-CM

## 2020-02-17 DIAGNOSIS — J449 Chronic obstructive pulmonary disease, unspecified: Secondary | ICD-10-CM

## 2020-02-17 NOTE — Chronic Care Management (AMB) (Signed)
Chronic Care Management    Clinical Social Work Note  02/17/2020 Name: Beth [name] MRN: [id number] DOB: 1946-01-15  Beth [name] is a 75 y.o. year old female who is a primary care patient of Lorine Bears, Lupita Raider, FNP. The CCM team was consulted to assist the patient with chronic disease management and/or care coordination needs related to: Mental Health Counseling and Resources.   Engaged with patient by telephone for follow up visit in response to provider referral for social work chronic care management and care coordination services.   Consent to Services:  The patient was given the following information about Chronic Care Management services today, agreed to services, and gave verbal consent: 1. CCM service includes personalized support from designated clinical staff supervised by the primary care provider, including individualized plan of care and coordination with other care providers 2. 24/7 contact phone numbers for assistance for urgent and routine care needs. 3. Service will only be billed when office clinical staff spend 20 minutes or more in a month to coordinate care. 4. Only one practitioner may furnish and bill the service in a calendar month. 5.The patient may stop CCM services at any time (effective at the end of the month) by phone call to the office staff. 6. The patient will be responsible for cost sharing (co-pay) of up to 20% of the service fee (after annual deductible is met). Patient agreed to services and consent obtained.  Patient agreed to services and consent obtained.   Assessment: Review of patient past medical history, allergies, medications, and health status, including review of relevant consultants reports was performed today as part of a comprehensive evaluation and provision of chronic care management and care coordination services.     SDOH (Social Determinants of Health) assessments and interventions performed:    Advanced Directives Status: See Care Plan  for related entries.  CCM Care Plan  Allergies  Allergen Reactions  . Bextra  [Valdecoxib]   . Compazine [Prochlorperazine Edisylate]     Stroke-like symptoms  . Lithium Carbonate     Leg weakness  . Lyrica [Pregabalin]     Outpatient Encounter Medications as of 02/17/2020  Medication Sig Note  . acetaminophen (TYLENOL) 500 MG tablet Take 500 mg by mouth 2 (two) times daily as needed.    Marland Kitchen aspirin 81 MG chewable tablet Chew 81 mg daily by mouth.   Marland Kitchen atorvastatin (LIPITOR) 40 MG tablet TAKE 1 TABLET BY MOUTH EVERY DAY   . azelastine (ASTELIN) 0.1 % nasal spray Place 2 sprays into both nostrils 2 (two) times daily.   . clonazePAM (KLONOPIN) 0.5 MG tablet TK 1 T PO DAILY PRN   . COMBIVENT RESPIMAT 20-100 MCG/ACT AERS respimat INHALE 1 PUFF BY MOUTH EVERY 6 HOURS   . FLUoxetine (PROZAC) 20 MG tablet Take 60 mg by mouth daily.    . Fluticasone-Umeclidin-Vilant (TRELEGY ELLIPTA) 200-62.5-25 MCG/INH AEPB Inhale 1 puff into the lungs daily.   . furosemide (LASIX) 20 MG tablet Take 1 tablet (20 mg total) by mouth daily.   Marland Kitchen gabapentin (NEURONTIN) 300 MG capsule Take 2 capsules (600 mg total) by mouth 3 (three) times daily. 02/02/2020: Reports taking 2 capsules (600 mg) twice daily  . hydrOXYzine (ATARAX/VISTARIL) 25 MG tablet Take 25-50 mg by mouth daily as needed. (Patient not taking: Reported on 01/14/2020)   . Melatonin 3 MG CAPS Take 10 mg by mouth at bedtime as needed (sleep).    . metoprolol succinate (TOPROL-XL) 25 MG 24 hr tablet TAKE 1/2  TABLET(12.5 MG) BY MOUTH DAILY   . montelukast (SINGULAIR) 10 MG tablet TAKE 1 TABLET(10 MG) BY MOUTH AT BEDTIME   . SEROQUEL 100 MG tablet Take 100 mg by mouth at bedtime.   . triamcinolone ointment (KENALOG) 0.5 % Apply 1 application topically 2 (two) times daily.    Facility-Administered Encounter Medications as of 02/17/2020  Medication  . albuterol (PROVENTIL) (2.5 MG/3ML) 0.083% nebulizer solution 2.5 mg    Patient Active Problem List    Diagnosis Date Noted  . Rash 01/14/2020  . Pulmonary hypertension ([place]) 09/14/2019  . Coccydynia 07/29/2019  . Cervicalgia 05/04/2019  . Acute pain of left shoulder 05/04/2019  . Elbow pain, left 05/04/2019  . DOE (dyspnea on exertion) 03/23/2019  . Leg swelling 03/22/2019    Class: Acute  . Lumbar degenerative disc disease 09/10/2018  . Left hip pain 09/10/2018  . Post-traumatic osteoarthritis of left elbow 07/14/2018  . Lumbar radiculopathy 02/12/2018  . Lumbar spondylosis 02/12/2018  . Lumbar facet arthropathy 02/12/2018  . Atherosclerosis of aorta ([place]) 09/17/2017  . Lung nodule 09/17/2017  . Urinary tract infection 12/05/2016  . Protein-calorie malnutrition, mild ([place]) 08/07/2016  . Generalized anxiety disorder 07/15/2016  . Chronic pain syndrome 07/15/2016  . Severe episode of recurrent major depressive disorder, without psychotic features ([place]) 07/14/2016  . Moderate benzodiazepine use disorder ([place]) 05/08/2016  . Major depressive disorder, recurrent, severe w/o psychotic behavior ([place]) 05/01/2016  . Seasonal allergic rhinitis 03/23/2015  . Hyperglycemia 03/23/2015  . Senile purpura ([place]) 03/23/2015  . Perennial allergic rhinitis with seasonal variation 03/23/2015  . Marital problems 10/31/2014  . Migraine without aura and without status migrainosus, not intractable 10/31/2014  . Chronic bilateral low back pain without sciatica 08/09/2014  . Colon polyp 08/09/2014  . COPD, severe ([place]) 08/09/2014  . CVA, old, hemiparesis ([place]) 08/09/2014  . Dyslipidemia 08/09/2014  . Dysfunction of eustachian tube 08/09/2014  . Fibromyalgia syndrome 08/09/2014  . Gastro-esophageal reflux disease without esophagitis 08/09/2014  . Benign migrating glossitis 08/09/2014  . Cerebrovascular accident, old 08/09/2014  . IBS (irritable bowel syndrome) 08/09/2014  . Low back pain with radiation 08/09/2014  . Chronic recurrent major depressive disorder ([place]) 08/09/2014  . Dysmetabolic syndrome  49/67/5916  . OP (osteoporosis) 08/09/2014  . Vitamin D deficiency 08/09/2014  . Smoking 08/09/2014  . Benign hypertension 07/19/2013  . Benign neoplasm of skin of trunk 06/03/2013  . H/O: pneumonia 09/25/2012    Conditions to be addressed/monitored: Anxiety and Depression; Level of care concerns and Mental Health Concerns   Care Plan : General Social Work (Adult)  Updates made by Greg Cutter, LCSW since 02/17/2020 12:00 AM    Problem: Response to Treatment (Depression)     Long-Range Goal: Response to Treatment Maximized   Start Date: 02/17/2020  This Visit's Progress: On track  Priority: Medium  Note:   Evidence-based guidance:   Engage patient in conversation about the perceived benefits of mental health treatment and quality of therapeutic alliance with his/her mental health professional.   Assess for barriers to attending appointments, such as transportation, financial, sense of slow or little improvement and forgetfulness.   Consider patient resistance to treatment based on stigma related to mental health diagnosis.   Maintain a documented system of ongoing contacts with patient during the first 6 to 12 months of treatment, as missed appointments and disengagement may signal deteriorating condition.   Provide anticipatory guidance about the risk of increased symptoms and potential psychiatric hospitalization for those who have a pattern of  nonattendance at mental health appointments.   Re-screen for depressive symptoms at mutually identified intervals.   Notes:   Timeframe:  Long-Range Goal Priority:  Medium Start Date:  02/17/20                           Expected End Date:  04/16/20                    Follow Up Date-90 days from 02/17/20   - avoid negative self-talk - develop a personal safety plan - develop a plan to deal with triggers like holidays, anniversaries - exercise at least 2 to 3 times per week - have a plan for how to handle bad days - journal  feelings and what helps to feel better or worse - spend time or talk with others at least 2 to 3 times per week - spend time or talk with others every day - watch for early signs of feeling worse - write in journal every day    Why is this important?    Keeping track of your progress will help your treatment team find the right mix of medicine and therapy for you.   Write in your journal every day.   Day-to-day changes in depression symptoms are normal. It may be more helpful to check your progress at the end of each week instead of every day.     Current Barriers:  . Chronic Mental Health needs related to depression . Financial constraints related to managing health care expenses . Limited social support . ADL IADL limitations . Limited access to caregiver . Inability to perform ADL's independently . Suicidal Ideation/Homicidal Ideation: No  Clinical Social Work Goal(s):  Marland Kitchen Over the next 120 days, patient will work with SW bi-monthly by telephone or in person to reduce or manage symptoms related to depression . Over the next 120 days, patient will work with SW to address concerns related to decreasing depression and increasing self-care  Interventions: . Patient interviewed and appropriate assessments performed: brief mental health assessment . Provided mental health counseling with regard to depression and anxiety. Patient reports managing her mental health well at this time. Patient shares that her anxiety is worse than her depression at this time. Patient reports getting outside and getting fresh air helps with anxiety. LCSW educated patient on coping methods to implement into her daily life to combat depressive symptoms and stress. Patient denied any suicidal or homicidal ideations. Encouraged patient to implement deep breathing exercises into her daily routine due to ongoing stress. Patient reports that she is actively working on both her physical and mental health. She shares that  her legs continue to swell even though she is now on Lasix. Patient reports that she is using her compression stockings during the day and elevating her legs at night as needed. Patient is still hopeful for some relief from the swelling.  . Patient reports that her psychiatrist in [place] wanted her to discontinue services and find a mental health provider that is closer to her location. Her psychiatrist referred her to a psychiatrist in [place], [place]. Patient is unable to state the name of this agency but reports that her initial appointment is on 02/21/20 and is telephonic. Patient will contact CCM LCSW if she has any issues with gaining new mental health services.   . Discussed plans with patient for ongoing care management follow up and provided patient with direct contact information for care  management team . Provided education and assistance to client regarding Advanced Directives. . A voluntary and extensive discussion about advanced care planning including explanation and discussion of advanced was undertaken with the patient. Explanation regarding healthcare proxy and living will was reviewed and packet with forms with explanation of how to fill them out was given.   . Provided education to patient/caregiver about Hospice and/or Palliative Care services . Patient reports that her rash is gone that she was concerned about in December of 2021. . Patient reports that she remained safe during the recent snow storm and she drove herself to the grocery store yesterday. She reports that her son that lives in [place] and calls her weekly to check on her needs. . Mindfulness or Relaxation Training provided during session for patient to implement when faced with future triggers . Patient reports implementing appropriate self-care by getting up each day and walking outside to get some fresh air. LCSW provided positive reinforcement for this accomplishment. . Patient denies any issues with her sleep routine.  She reports that she takes a melatonin vitamin every night which helps her to unwind from the day.   Patient Self Care Activities:  . Calls provider office for new concerns or questions . Ability for insight . Motivation for treatment  Patient Coping Strengths:  . Hopefulness . Self Advocate . Able to Communicate Effectively  Patient Self Care Deficits:  . Lacks social connections  Please see past updates related to this goal by clicking on the "Past Updates" button in the selected goal     Task: Facilitate Engagement in Mental Health Services   Note:   Care Management Activities:    - attendance at mental health appointments reviewed - barriers to treatment reviewed and addressed - risk of unmanaged depression discussed    Notes:       Follow Up Plan: SW will follow up with patient by phone over the next quarter       , BSW, MSW, LCSW [facility]  [email or web address] Phone: [phone]     

## 2020-02-24 ENCOUNTER — Telehealth: Payer: Self-pay

## 2020-02-24 ENCOUNTER — Telehealth: Payer: Self-pay | Admitting: General Practice

## 2020-02-24 NOTE — Telephone Encounter (Signed)
  Chronic Care Management   Outreach Note  02/24/2020 Name: Beth [name] MRN: [id number] DOB: [birth year]  Referred by: Verl Bangs, FNP Reason for referral : Appointment (RNCM: Follow up for Chronic Disease Management and Care Coordination Needs)   An unsuccessful telephone outreach was attempted today. The patient was referred to the case management team for assistance with care management and care coordination.   Follow Up Plan: A HIPAA compliant phone message was left for the patient providing contact information and requesting a return call.   [name] RN, MSN, [facility] [place] Mobile: [phone]

## 2020-02-28 ENCOUNTER — Telehealth: Payer: Self-pay

## 2020-02-28 NOTE — Chronic Care Management (AMB) (Unsigned)
  Care Management   Note  02/28/2020 Name: Beth [name] MRN: [id number] DOB: May 23, 1945  Beth [name] is a 75 y.o. year old female who is a primary care patient of Lorine Bears, Lupita Raider, FNP and is actively engaged with the care management team. I reached out to [name] by phone today to assist with re-scheduling a follow up visit with the RN Case Manager  Follow up plan: Unsuccessful telephone outreach attempt made. A HIPAA compliant phone message was left for the patient providing contact information and requesting a return call.  The care management team will reach out to the patient again over the next 7 days.  If patient returns call to provider office, please advise to call [facility]  at [place], [place], [place], [place] [address] Direct Dial: [phone] [email or web address] Website: [email or web address]

## 2020-02-29 NOTE — Chronic Care Management (AMB) (Signed)
  Care Management   Note  02/29/2020 Name: MANILA ROMMEL MRN: [id number] DOB: 07-07-1945  Cresenciano Lick Romanoff is a 75 y.o. year old female who is a primary care patient of Lorine Bears, Lupita Raider, FNP and is actively engaged with the care management team. I reached out to [name] by phone today to assist with re-scheduling a follow up visit with the RN Case Manager  Follow up plan: Unsuccessful telephone outreach attempt made. A HIPAA compliant phone message was left for the patient providing contact information and requesting a return call.  The care management team will reach out to the patient again over the next 4 days.  If patient returns call to provider office, please advise to call [facility]  at [place], [place], [place], [place] [address] Direct Dial: [phone] [email or web address] Website: [email or web address]

## 2020-02-29 NOTE — Chronic Care Management (AMB) (Signed)
  Care Management   Note  02/29/2020 Name: ALDINE CHAKRABORTY MRN: [id number] DOB: 1945-09-18  Cresenciano Lick Rominger is a 75 y.o. year old female who is a primary care patient of [place], Lupita Raider, FNP and is actively engaged with the care management team. I reached out to [name] by phone today to assist with re-scheduling a follow up visit with the RN Case Manager  Follow up plan: Telephone appointment with care management team member scheduled for:04/06/2020  [name], [place], [place], [place] [address] Direct Dial: [phone] [email or web address] Website: [email or web address]

## 2020-03-21 ENCOUNTER — Ambulatory Visit (INDEPENDENT_AMBULATORY_CARE_PROVIDER_SITE_OTHER): Payer: Medicare HMO | Admitting: Licensed Clinical Social Worker

## 2020-03-21 DIAGNOSIS — F332 Major depressive disorder, recurrent severe without psychotic features: Secondary | ICD-10-CM

## 2020-03-21 DIAGNOSIS — F411 Generalized anxiety disorder: Secondary | ICD-10-CM

## 2020-03-21 DIAGNOSIS — J449 Chronic obstructive pulmonary disease, unspecified: Secondary | ICD-10-CM | POA: Diagnosis not present

## 2020-03-21 DIAGNOSIS — I10 Essential (primary) hypertension: Secondary | ICD-10-CM | POA: Diagnosis not present

## 2020-03-21 DIAGNOSIS — R69 Illness, unspecified: Secondary | ICD-10-CM | POA: Diagnosis not present

## 2020-03-21 NOTE — Chronic Care Management (AMB) (Signed)
Chronic Care Management    Clinical Social Work Note  03/21/2020 Name: Beth [name] MRN: [id number] DOB: 1945-10-26  Beth [name] is a 75 y.o. year old female who is a primary care patient of Lorine Bears, Lupita Raider, FNP. The CCM team was consulted to assist the patient with chronic disease management and/or care coordination needs related to: Mental Health Counseling and Resources.   Engaged with patient by telephone for follow up visit in response to provider referral for social work chronic care management and care coordination services.   Consent to Services:  The patient was given the following information about Chronic Care Management services today, agreed to services, and gave verbal consent: 1. CCM service includes personalized support from designated clinical staff supervised by the primary care provider, including individualized plan of care and coordination with other care providers 2. 24/7 contact phone numbers for assistance for urgent and routine care needs. 3. Service will only be billed when office clinical staff spend 20 minutes or more in a month to coordinate care. 4. Only one practitioner may furnish and bill the service in a calendar month. 5.The patient may stop CCM services at any time (effective at the end of the month) by phone call to the office staff. 6. The patient will be responsible for cost sharing (co-pay) of up to 20% of the service fee (after annual deductible is met). Patient agreed to services and consent obtained.  Patient agreed to services and consent obtained.   Assessment: Review of patient past medical history, allergies, medications, and health status, including review of relevant consultants reports was performed today as part of a comprehensive evaluation and provision of chronic care management and care coordination services.     SDOH (Social Determinants of Health) assessments and interventions performed:    Advanced Directives Status: See Care Plan  for related entries.  CCM Care Plan  Allergies  Allergen Reactions  . Bextra  [Valdecoxib]   . Compazine [Prochlorperazine Edisylate]     Stroke-like symptoms  . Lithium Carbonate     Leg weakness  . Lyrica [Pregabalin]     Outpatient Encounter Medications as of 03/21/2020  Medication Sig Note  . acetaminophen (TYLENOL) 500 MG tablet Take 500 mg by mouth 2 (two) times daily as needed.    Marland Kitchen aspirin 81 MG chewable tablet Chew 81 mg daily by mouth.   Marland Kitchen atorvastatin (LIPITOR) 40 MG tablet TAKE 1 TABLET BY MOUTH EVERY DAY   . azelastine (ASTELIN) 0.1 % nasal spray Place 2 sprays into both nostrils 2 (two) times daily.   . clonazePAM (KLONOPIN) 0.5 MG tablet TK 1 T PO DAILY PRN   . COMBIVENT RESPIMAT 20-100 MCG/ACT AERS respimat INHALE 1 PUFF BY MOUTH EVERY 6 HOURS   . FLUoxetine (PROZAC) 20 MG tablet Take 60 mg by mouth daily.    . Fluticasone-Umeclidin-Vilant (TRELEGY ELLIPTA) 200-62.5-25 MCG/INH AEPB Inhale 1 puff into the lungs daily.   . furosemide (LASIX) 20 MG tablet Take 1 tablet (20 mg total) by mouth daily.   Marland Kitchen gabapentin (NEURONTIN) 300 MG capsule Take 2 capsules (600 mg total) by mouth 3 (three) times daily. 02/02/2020: Reports taking 2 capsules (600 mg) twice daily  . hydrOXYzine (ATARAX/VISTARIL) 25 MG tablet Take 25-50 mg by mouth daily as needed. (Patient not taking: Reported on 01/14/2020)   . Melatonin 3 MG CAPS Take 10 mg by mouth at bedtime as needed (sleep).    . metoprolol succinate (TOPROL-XL) 25 MG 24 hr tablet TAKE 1/2  TABLET(12.5 MG) BY MOUTH DAILY   . montelukast (SINGULAIR) 10 MG tablet TAKE 1 TABLET(10 MG) BY MOUTH AT BEDTIME   . SEROQUEL 100 MG tablet Take 100 mg by mouth at bedtime.   . triamcinolone ointment (KENALOG) 0.5 % Apply 1 application topically 2 (two) times daily.    Facility-Administered Encounter Medications as of 03/21/2020  Medication  . albuterol (PROVENTIL) (2.5 MG/3ML) 0.083% nebulizer solution 2.5 mg    Patient Active Problem List    Diagnosis Date Noted  . Rash 01/14/2020  . Pulmonary hypertension ([place]) 09/14/2019  . Coccydynia 07/29/2019  . Cervicalgia 05/04/2019  . Acute pain of left shoulder 05/04/2019  . Elbow pain, left 05/04/2019  . DOE (dyspnea on exertion) 03/23/2019  . Leg swelling 03/22/2019    Class: Acute  . Lumbar degenerative disc disease 09/10/2018  . Left hip pain 09/10/2018  . Post-traumatic osteoarthritis of left elbow 07/14/2018  . Lumbar radiculopathy 02/12/2018  . Lumbar spondylosis 02/12/2018  . Lumbar facet arthropathy 02/12/2018  . Atherosclerosis of aorta ([place]) 09/17/2017  . Lung nodule 09/17/2017  . Urinary tract infection 12/05/2016  . Protein-calorie malnutrition, mild ([place]) 08/07/2016  . Generalized anxiety disorder 07/15/2016  . Chronic pain syndrome 07/15/2016  . Severe episode of recurrent major depressive disorder, without psychotic features ([place]) 07/14/2016  . Moderate benzodiazepine use disorder ([place]) 05/08/2016  . Major depressive disorder, recurrent, severe w/o psychotic behavior ([place]) 05/01/2016  . Seasonal allergic rhinitis 03/23/2015  . Hyperglycemia 03/23/2015  . Senile purpura ([place]) 03/23/2015  . Perennial allergic rhinitis with seasonal variation 03/23/2015  . Marital problems 10/31/2014  . Migraine without aura and without status migrainosus, not intractable 10/31/2014  . Chronic bilateral low back pain without sciatica 08/09/2014  . Colon polyp 08/09/2014  . COPD, severe ([place]) 08/09/2014  . CVA, old, hemiparesis ([place]) 08/09/2014  . Dyslipidemia 08/09/2014  . Dysfunction of eustachian tube 08/09/2014  . Fibromyalgia syndrome 08/09/2014  . Gastro-esophageal reflux disease without esophagitis 08/09/2014  . Benign migrating glossitis 08/09/2014  . Cerebrovascular accident, old 08/09/2014  . IBS (irritable bowel syndrome) 08/09/2014  . Low back pain with radiation 08/09/2014  . Chronic recurrent major depressive disorder ([place]) 08/09/2014  . Dysmetabolic syndrome  23/76/2831  . OP (osteoporosis) 08/09/2014  . Vitamin D deficiency 08/09/2014  . Smoking 08/09/2014  . Benign hypertension 07/19/2013  . Benign neoplasm of skin of trunk 06/03/2013  . H/O: pneumonia 09/25/2012    Conditions to be addressed/monitored: Anxiety and Depression; Mental Health Concerns   Care Plan : General Social Work (Adult)  Updates made by Greg Cutter, LCSW since 03/21/2020 12:00 AM    Problem: Response to Treatment (Depression)     Long-Range Goal: Response to Treatment Maximized   Start Date: 02/17/2020  This Visit's Progress: On track  Recent Progress: On track  Priority: Medium  Note:   Evidence-based guidance:   Engage patient in conversation about the perceived benefits of mental health treatment and quality of therapeutic alliance with his/her mental health professional.   Assess for barriers to attending appointments, such as transportation, financial, sense of slow or little improvement and forgetfulness.   Consider patient resistance to treatment based on stigma related to mental health diagnosis.   Maintain a documented system of ongoing contacts with patient during the first 6 to 12 months of treatment, as missed appointments and disengagement may signal deteriorating condition.   Provide anticipatory guidance about the risk of increased symptoms and potential psychiatric hospitalization for those who have a pattern of  nonattendance at mental health appointments.   Re-screen for depressive symptoms at mutually identified intervals.   Notes:   Timeframe:  Long-Range Goal Priority:  Medium Start Date:  02/17/20                           Expected End Date:  05/17/20                    Follow Up Date-04/25/20   - avoid negative self-talk - develop a personal safety plan - develop a plan to deal with triggers like holidays, anniversaries - exercise at least 2 to 3 times per week - have a plan for how to handle bad days - journal feelings and what  helps to feel better or worse - spend time or talk with others at least 2 to 3 times per week - spend time or talk with others every day - watch for early signs of feeling worse - write in journal every day    Why is this important?    Keeping track of your progress will help your treatment team find the right mix of medicine and therapy for you.   Write in your journal every day.   Day-to-day changes in depression symptoms are normal. It may be more helpful to check your progress at the end of each week instead of every day.     Current Barriers:  . Chronic Mental Health needs related to depression . Financial constraints related to managing health care expenses . Limited social support . ADL IADL limitations . Limited access to caregiver . Inability to perform ADL's independently . Suicidal Ideation/Homicidal Ideation: No  Clinical Social Work Goal(s):  Marland Kitchen Over the next 120 days, patient will work with SW bi-monthly by telephone or in person to reduce or manage symptoms related to depression . Over the next 120 days, patient will work with SW to address concerns related to decreasing depression and increasing self-care  Interventions: . Patient interviewed and appropriate assessments performed: brief mental health assessment . CCM LCSW spoke to both patient and son on 03/21/20. Provided mental health counseling with regard to depression and anxiety. Patient reports managing her mental health well at this time. Patient shares that her anxiety is worse than her depression at this time. Patient reports getting outside and getting fresh air helps with anxiety. LCSW educated patient on coping methods to implement into her daily life to combat depressive symptoms and stress. Patient denied any suicidal or homicidal ideations. Encouraged patient to implement deep breathing exercises into her daily routine due to ongoing stress. Patient reports that she is actively working on both her physical  and mental health. She shares that her legs continue to swell even though she is now on Lasix. Patient reports that she is using her compression stockings during the day and elevating her legs at night as needed. Patient is still hopeful for some relief from the swelling.  . Patient reports that her psychiatrist in [place] wanted her to discontinue services and find a mental health provider that is closer to her location. Her psychiatrist referred her to a psychiatrist in [place], [place]. Patient is unable to state the name of this agency but reports that her initial appointment is on 02/21/20 and is telephonic. Patient reports that they had to reschedule this appointment for tomorrow at 11:15. Patient will complete new patient appointment with psychiatrist tomorrow. She confirms that this is a telephonic appointment. Patient  will contact CCM LCSW if she has any issues with gaining new mental health services.   . Discussed plans with patient for ongoing care management follow up and provided patient with direct contact information for care management team . Provided education and assistance to client regarding Advanced Directives. . A voluntary and extensive discussion about advanced care planning including explanation and discussion of advanced was undertaken with the patient. Explanation regarding healthcare proxy and living will was reviewed and packet with forms with explanation of how to fill them out was given.   . Provided education to patient/caregiver about Hospice and/or Palliative Care services . Patient reports that her rash is gone that she was concerned about in December of 2021. Marland Kitchen Patient reports that she remained safe during the recent snow storm and she drove herself to the grocery store yesterday. She reports that her son that lives in [place] and calls and visits her weekly to check on her needs. . Mindfulness or Relaxation Training provided during session for patient to implement when faced  with future triggers . Patient reports implementing appropriate self-care by getting up each day and walking outside to get some fresh air. LCSW provided positive reinforcement for this accomplishment. . Patient denies any issues with her sleep routine. She reports that she takes a melatonin vitamin every night which helps her to unwind from the day.  . Son reports that patient is doing well and has improved her socialization by going out to eat and running errands with her neighbor friends that live in her same complex. Son continues to provide stable transportation to all of patient's medical appointments.   Patient Self Care Activities:  . Calls provider office for new concerns or questions . Ability for insight . Motivation for treatment  Patient Coping Strengths:  . Hopefulness . Self Advocate . Able to Communicate Effectively  Patient Self Care Deficits:  . Lacks social connections  Please see past updates related to this goal by clicking on the "Past Updates" button in the selected goal     Task: Facilitate Engagement in [place]   Note:   Care Management Activities:    - attendance at mental health appointments reviewed - barriers to treatment reviewed and addressed - re-screen for depressive symptoms performed - perceived benefits to therapy discussed - risk of unmanaged depression discussed    Notes:       Follow Up Plan: SW will follow up with patient by phone over the next quarter      Eula Fried, [place], MSW, [email or web address] Phone: [phone]

## 2020-03-27 ENCOUNTER — Ambulatory Visit
Admission: EM | Admit: 2020-03-27 | Discharge: 2020-03-27 | Disposition: A | Discharge status: Home / Self Care | Payer: Medicare HMO | Attending: Physician Assistant | Admitting: Physician Assistant

## 2020-03-27 ENCOUNTER — Telehealth: Payer: Medicare HMO | Admitting: Family Medicine

## 2020-03-27 ENCOUNTER — Encounter: Payer: Self-pay | Admitting: Emergency Medicine

## 2020-03-27 ENCOUNTER — Telehealth: Payer: Self-pay

## 2020-03-27 ENCOUNTER — Other Ambulatory Visit: Payer: Self-pay

## 2020-03-27 ENCOUNTER — Telehealth: Payer: Self-pay | Admitting: Pharmacist

## 2020-03-27 DIAGNOSIS — R21 Rash and other nonspecific skin eruption: Secondary | ICD-10-CM | POA: Diagnosis not present

## 2020-03-27 DIAGNOSIS — J029 Acute pharyngitis, unspecified: Secondary | ICD-10-CM | POA: Diagnosis not present

## 2020-03-27 DIAGNOSIS — R220 Localized swelling, mass and lump, head: Secondary | ICD-10-CM | POA: Diagnosis not present

## 2020-03-27 LAB — GROUP A STREP BY PCR: Group A Strep by PCR: NOT DETECTED

## 2020-03-27 MED ORDER — PREDNISONE 10 MG PO TABS: ORAL_TABLET | ORAL | 0 refills | Status: DC

## 2020-03-27 MED ORDER — DIPHENHYDRAMINE HCL 50 MG PO CAPS
50.0000 mg | ORAL_CAPSULE | Freq: Once | ORAL | Status: AC
  Administered 2020-03-27: 50 mg via ORAL

## 2020-03-27 MED ORDER — METHYLPREDNISOLONE SODIUM SUCC 125 MG IJ SOLR
125.0000 mg | Freq: Once | INTRAMUSCULAR | Status: AC
  Administered 2020-03-27: 125 mg via INTRAMUSCULAR

## 2020-03-27 NOTE — ED Provider Notes (Signed)
[facility]    CSN: [id number] Arrival date & time: 03/27/20  1034      History   Chief Complaint Chief Complaint  Patient presents with  . Rash  . Fever    HPI Beth [name] is a 75 y.o. female presenting for swelling and redness of her cheeks, forehead and chin x3 days.  Patient states that on the first day it started on the left side of her cheek and then it spread to the right side.  She says it is a little bit itchy and "tight."  She denies any pain.  She says that the skin feels hot.  She says she has also had a fever up to 101 degrees.  Patient also admits to a sore throat on the left side and says it feels like her gland is swollen left side of her neck.  She denies any fatigue, body aches, headaches, throat tightness, mouth swelling, chest tightness or breathing difficulty.  No known sick contacts.  No known COVID-19 exposure.  Patient fully vaccinated for COVID-19.  She states she had a negative at home COVID-19 test yesterday and does not want a repeat one today.  Patient denies starting any new medications, eating any new foods, or applying any new topical creams or ointments.  Patient says she has had skin irritation in the past to different topical creams, but has not used any at all in the past week.  She has no other concerns.  HPI  Past Medical History:  Diagnosis Date  . Allergy   . Anxiety   . Asthma   . Chronic pain syndrome    discharged from pain clinic, hx of narcotics seeking behavior  . COPD (chronic obstructive pulmonary disease) ([place])   . CVA (cerebral infarction)   . Depression   . Fibromyalgia   . Headache   . Hyperlipidemia   . Hypertension   . IBS (irritable bowel syndrome)   . Stroke ([place])   . Vitamin D deficiency     Patient Active Problem List   Diagnosis Date Noted  . Rash 01/14/2020  . Pulmonary hypertension ([place]) 09/14/2019  . Coccydynia 07/29/2019  . Cervicalgia 05/04/2019  . Acute pain of left shoulder 05/04/2019  .  Elbow pain, left 05/04/2019  . DOE (dyspnea on exertion) 03/23/2019  . Leg swelling 03/22/2019    Class: Acute  . Lumbar degenerative disc disease 09/10/2018  . Left hip pain 09/10/2018  . Post-traumatic osteoarthritis of left elbow 07/14/2018  . Lumbar radiculopathy 02/12/2018  . Lumbar spondylosis 02/12/2018  . Lumbar facet arthropathy 02/12/2018  . Atherosclerosis of aorta ([place]) 09/17/2017  . Lung nodule 09/17/2017  . Urinary tract infection 12/05/2016  . Protein-calorie malnutrition, mild ([place]) 08/07/2016  . Generalized anxiety disorder 07/15/2016  . Chronic pain syndrome 07/15/2016  . Severe episode of recurrent major depressive disorder, without psychotic features ([place]) 07/14/2016  . Moderate benzodiazepine use disorder ([place]) 05/08/2016  . Major depressive disorder, recurrent, severe w/o psychotic behavior ([place]) 05/01/2016  . Seasonal allergic rhinitis 03/23/2015  . Hyperglycemia 03/23/2015  . Senile purpura ([place]) 03/23/2015  . Perennial allergic rhinitis with seasonal variation 03/23/2015  . Marital problems 10/31/2014  . Migraine without aura and without status migrainosus, not intractable 10/31/2014  . Chronic bilateral low back pain without sciatica 08/09/2014  . Colon polyp 08/09/2014  . COPD, severe ([place]) 08/09/2014  . CVA, old, hemiparesis ([place]) 08/09/2014  . Dyslipidemia 08/09/2014  . Dysfunction of eustachian tube 08/09/2014  . Fibromyalgia syndrome  08/09/2014  . Gastro-esophageal reflux disease without esophagitis 08/09/2014  . Benign migrating glossitis 08/09/2014  . Cerebrovascular accident, old 08/09/2014  . IBS (irritable bowel syndrome) 08/09/2014  . Low back pain with radiation 08/09/2014  . Chronic recurrent major depressive disorder ([place]) 08/09/2014  . Dysmetabolic syndrome 09/47/0962  . OP (osteoporosis) 08/09/2014  . Vitamin D deficiency 08/09/2014  . Smoking 08/09/2014  . Benign hypertension 07/19/2013  . Benign neoplasm of skin of trunk  06/03/2013  . H/O: pneumonia 09/25/2012    Past Surgical History:  Procedure Laterality Date  . ABDOMINAL HYSTERECTOMY    . APPENDECTOMY    . BREAST EXCISIONAL BIOPSY  2011   Pt states lump removed, ? side, no scar seen  . BREAST SURGERY  2011   biopsy  . CERVICAL DISCECTOMY    . CHOLECYSTECTOMY    . SINUSOTOMY      OB History   No obstetric history on file.      Home Medications    Prior to Admission medications   Medication Sig Start Date End Date Taking? Authorizing Provider  acetaminophen (TYLENOL) 500 MG tablet Take 500 mg by mouth 2 (two) times daily as needed.    Yes [provider]  aspirin 81 MG chewable tablet Chew 81 mg daily by mouth.   Yes [provider]  atorvastatin (LIPITOR) 40 MG tablet TAKE 1 TABLET BY MOUTH EVERY DAY 01/14/20  Yes Malfi, Lupita Raider, FNP  azelastine (ASTELIN) 0.1 % nasal spray Place 2 sprays into both nostrils 2 (two) times daily. 04/10/18  Yes Mikey College, NP  clonazePAM (KLONOPIN) 0.5 MG tablet TK 1 T PO DAILY PRN 04/01/18  Yes [provider]  COMBIVENT RESPIMAT 20-100 MCG/ACT AERS respimat INHALE 1 PUFF BY MOUTH EVERY 6 HOURS 01/04/19  Yes Karamalegos, [name], DO  FLUoxetine (PROZAC) 20 MG tablet Take 60 mg by mouth daily.    Yes [provider]  Fluticasone-Umeclidin-Vilant (TRELEGY ELLIPTA) 200-62.5-25 MCG/INH AEPB Inhale 1 puff into the lungs daily. 09/14/19  Yes Parrett, Tammy S, NP  gabapentin (NEURONTIN) 300 MG capsule Take 2 capsules (600 mg total) by mouth 3 (three) times daily. 12/14/19 06/11/20 Yes [name], MD  Melatonin 3 MG CAPS Take 10 mg by mouth at bedtime as needed (sleep).    Yes [provider]  metoprolol succinate (TOPROL-XL) 25 MG 24 hr tablet TAKE 1/2 TABLET(12.5 MG) BY MOUTH DAILY 09/03/19  Yes Malfi, Lupita Raider, FNP  montelukast (SINGULAIR) 10 MG tablet TAKE 1 TABLET(10 MG) BY MOUTH AT BEDTIME 12/12/19  Yes Malfi, Lupita Raider, FNP  predniSONE (DELTASONE) 10 MG  tablet Take 5 tabs PO x 1 day, 4 tabs x 2 days, 3 tabs x 2 days, 2 tabs x 2 days, 1 tab x 1 day 03/27/20  Yes Laurene Footman B, PA-C  SEROQUEL 100 MG tablet Take 100 mg by mouth at bedtime. 03/11/19  Yes [provider]  triamcinolone ointment (KENALOG) 0.5 % Apply 1 application topically 2 (two) times daily. 01/14/20  Yes Malfi, Lupita Raider, FNP  furosemide (LASIX) 20 MG tablet Take 1 tablet (20 mg total) by mouth daily. 03/25/19 11/15/19  Kate Sable, MD  hydrOXYzine (ATARAX/VISTARIL) 25 MG tablet Take 25-50 mg by mouth daily as needed. Patient not taking: No sig reported 06/17/19   [provider]    Family History Family History  Problem Relation Age of Onset  . Anxiety disorder Mother   . Depression Mother   . Breast cancer Mother 85  .  Cancer Father   . Gallbladder disease Father   . Alcohol abuse Father   . Depression Father     Social History Social History   Tobacco Use  . Smoking status: Current Every Day Smoker    Packs/day: 0.25    Years: 57.00    Pack years: 14.25    Types: Cigarettes  . Smokeless tobacco: Never Used  . Tobacco comment: .5ppd currently  Vaping Use  . Vaping Use: Never used  Substance Use Topics  . Alcohol use: No    Alcohol/week: 0.0 standard drinks  . Drug use: No     Allergies   Bextra  [valdecoxib], Compazine [prochlorperazine edisylate], Lithium carbonate, and Lyrica [pregabalin]   Review of Systems Review of Systems  Constitutional: Positive for fever. Negative for chills, diaphoresis and fatigue.  HENT: Positive for facial swelling and sore throat. Negative for congestion, ear pain, rhinorrhea, sinus pressure, sinus pain, trouble swallowing and voice change.   Respiratory: Negative for cough, chest tightness, shortness of breath and wheezing.   Cardiovascular: Negative for chest pain.  Gastrointestinal: Negative for abdominal pain, nausea and vomiting.  Musculoskeletal: Negative for arthralgias and myalgias.   Skin: Negative for rash.  Neurological: Negative for weakness and headaches.  Hematological: Negative for adenopathy.     Physical Exam Triage Vital Signs ED Triage Vitals  Enc Vitals Group     BP 03/27/20 1107 128/61     Pulse Rate 03/27/20 1107 90     Resp 03/27/20 1107 18     Temp 03/27/20 1107 98.2 F (36.8 C)     Temp Source 03/27/20 1107 Oral     SpO2 03/27/20 1107 99 %     Weight 03/27/20 1105 160 lb 7.9 oz (72.8 kg)     Height 03/27/20 1105 5\' 5"  (1.651 m)     Head Circumference --      Peak Flow --      Pain Score 03/27/20 1105 0     Pain Loc --      Pain Edu? --      Excl. in [place]? --    No data found.  Updated Vital Signs BP 128/61 (BP Location: Left Arm)   Pulse 90   Temp 98.2 F (36.8 C) (Oral)   Resp 18   Ht 5\' 5"  (1.651 m)   Wt 160 lb 7.9 oz (72.8 kg)   SpO2 99%   BMI 26.71 kg/m       Physical Exam Vitals and nursing note reviewed.  Constitutional:      General: She is not in acute distress.    Appearance: Normal appearance. She is not ill-appearing or toxic-appearing.  HENT:     Head: Normocephalic and atraumatic.     Comments: There are multiple erythematous patches of bilateral cheeks, chin and forehead.  The erythematous patches of the cheeks, over the maxillary area, is edematous as well.  Appears to be a little bit tender on the right side.    Right Ear: Tympanic membrane, ear canal and external ear normal.     Left Ear: Tympanic membrane, ear canal and external ear normal.     Nose: Nose normal.     Mouth/Throat:     Mouth: Mucous membranes are moist.     Pharynx: Oropharynx is clear. Posterior oropharyngeal erythema (mild injection) present.  Eyes:     General: No scleral icterus.       Right eye: No discharge.        Left eye:  No discharge.     Conjunctiva/sclera: Conjunctivae normal.  Cardiovascular:     Rate and Rhythm: Normal rate and regular rhythm.     Heart sounds: Normal heart sounds.  Pulmonary:     Effort: Pulmonary  effort is normal. No respiratory distress.     Breath sounds: Normal breath sounds. No wheezing, rhonchi or rales.  Musculoskeletal:     Cervical back: Neck supple.  Lymphadenopathy:     Cervical: No cervical adenopathy.  Skin:    General: Skin is dry.  Neurological:     General: No focal deficit present.     Mental Status: She is alert. Mental status is at baseline.     Motor: No weakness.     Gait: Gait normal.  Psychiatric:        Mood and Affect: Mood normal.        Behavior: Behavior normal.        Thought Content: Thought content normal.      UC Treatments / Results  Labs (all labs ordered are listed, but only abnormal results are displayed) Labs Reviewed  GROUP A STREP BY PCR    EKG   Radiology No results found.  Procedures Procedures (including critical care time)  Medications Ordered in UC Medications  methylPREDNISolone sodium succinate (SOLU-MEDROL) 125 mg/2 mL injection 125 mg (125 mg Intramuscular Given 03/27/20 1206)  diphenhydrAMINE (BENADRYL) capsule 50 mg (50 mg Oral Given 03/27/20 1206)    Initial Impression / Assessment and Plan / UC Course  I have reviewed the triage vital signs and the nursing notes.  Pertinent labs & imaging results that were available during my care of the patient were reviewed by me and considered in my medical decision making (see chart for details).   75 year old female presenting for swelling and redness of her face x3 days.  Also mentioned she has some mild sore throat and fever.  Vital signs are all stable and patient is in no acute distress.  She denies any mouth swelling, throat tightness or breathing difficulty.  Has not been taking any medication at all for symptoms other than Tylenol.  Exam significant for multiple erythematous and edematous patches of the face, worse over the maxillary regions.  No oropharyngeal swelling, voice hoarseness or lip swelling.  Her chest is clear to auscultation heart regular rate and  rhythm.  Patient has been tested for strep throat molecular test.  Advised her we will call with results if positive.  Rash appears to be consistent with hives of the face.  No other part of the body is involved.  Possible allergic reaction versus hives with an infection such as viral illness.  Since the rash does affect her face and is not improving, patient given Solu-Medrol in the clinic as well as Benadryl.  I sent prednisone for her to start tomorrow and advised taking the Benadryl every 4-6 hours as long as this continues.  Advised her to contact her PCP so that she can follow-up with somebody in the next 24 to 48 hours.  I reviewed ED precautions thoroughly with patient.  Molecular strep negative.    Final Clinical Impressions(s) / UC Diagnoses   Final diagnoses:  Rash and nonspecific skin eruption  Facial swelling  Sore throat     Discharge Instructions     I will call you if your strep test is positive.  Your exam is consistent with some type of allergic reaction affecting your face.  Since you have swelling and redness involving  her face and we do not want to worsen you have been given an injection of a corticosteroid today.  You should start the prednisone if sent to the pharmacy tomorrow.  Take Benadryl 1 to 2 tablets every 4-6 hours as long as this continues.  You have been given your first dose of Benadryl at 12:00 today.  Your next dose would be around 4-6 PM.  Also use cool compresses on your face to help reduce the swelling.  Please contact your PCP today or tomorrow and let them know that you were seen in the urgent care.  I would like someone to recheck you in the next 24 to 48 hours.  If any symptoms worsen-increased swelling, redness or if you have any pain, difficulty swallowing or difficulty breathing or throat tightness you need to call EMS or have someone take immediately to the emergency department.    ED Prescriptions    Medication Sig Dispense Auth. Provider    predniSONE (DELTASONE) 10 MG tablet Take 5 tabs PO x 1 day, 4 tabs x 2 days, 3 tabs x 2 days, 2 tabs x 2 days, 1 tab x 1 day 24 tablet Danton Clap, PA-C     PDMP not reviewed this encounter.   Danton Clap, PA-C 03/27/20 1300

## 2020-03-27 NOTE — Telephone Encounter (Signed)
  Chronic Care Management   Outreach Note  03/27/2020 Name: Beth [name] MRN: [id number] DOB: [birth year]  Referred by: Verl Bangs, FNP Reason for referral : No chief complaint on file.   Was unable to reach patient via telephone today and have left HIPAA compliant voicemail asking patient to return my call.    Follow Up Plan: Will collaborate with Care Guide to outreach to schedule follow up with me  [name], PharmD, [place] [facility] [phone]

## 2020-03-27 NOTE — Discharge Instructions (Signed)
I will call you if your strep test is positive.  Your exam is consistent with some type of allergic reaction affecting your face.  Since you have swelling and redness involving her face and we do not want to worsen you have been given an injection of a corticosteroid today.  You should start the prednisone if sent to the pharmacy tomorrow.  Take Benadryl 1 to 2 tablets every 4-6 hours as long as this continues.  You have been given your first dose of Benadryl at 12:00 today.  Your next dose would be around 4-6 PM.  Also use cool compresses on your face to help reduce the swelling.  Please contact your PCP today or tomorrow and let them know that you were seen in the urgent care.  I would like someone to recheck you in the next 24 to 48 hours.  If any symptoms worsen-increased swelling, redness or if you have any pain, difficulty swallowing or difficulty breathing or throat tightness you need to call EMS or have someone take immediately to the emergency department.

## 2020-03-27 NOTE — ED Triage Notes (Signed)
Patient c/o facial swelling and redness that started on Friday. She also report she had a fever that started on Friday.

## 2020-03-28 ENCOUNTER — Telehealth: Payer: Self-pay | Admitting: *Deleted

## 2020-03-28 NOTE — Telephone Encounter (Signed)
Attempted to contact patient to schedule lung screening. Left message to call Shawn at [phone]. 

## 2020-03-29 ENCOUNTER — Telehealth: Payer: Self-pay

## 2020-03-29 NOTE — Chronic Care Management (AMB) (Signed)
  Care Management   Note  03/29/2020 Name: Beth [name] MRN: [id number] DOB: 03-16-1945  [name] is a 75 y.o. year old female who is a primary care patient of Lorine Bears, Lupita Raider, FNP and is actively engaged with the care management team. I reached out to [name] by phone today to assist with re-scheduling a follow up visit with the Pharmacist  Follow up plan: Unsuccessful telephone outreach attempt made. A HIPAA compliant phone message was left for the patient providing contact information and requesting a return call.  The care management team will reach out to the patient again over the next 7 days.  If patient returns call to provider office, please advise to call [facility]  at [place], [place], [place], [place] [address] Direct Dial: [phone] [name] Website: [email or web address]

## 2020-03-31 ENCOUNTER — Telehealth: Payer: Self-pay | Admitting: Licensed Clinical Social Worker

## 2020-03-31 NOTE — Telephone Encounter (Signed)
Left message for patient to contact our office to schedule CT for lung screening

## 2020-04-02 ENCOUNTER — Telehealth: Payer: Self-pay | Admitting: Emergency Medicine

## 2020-04-02 NOTE — Telephone Encounter (Signed)
Reviewed previous note from Monday.  Patient was complaining of a sore throat but had a negative strep test at that time.  She was treated by me with a corticosteroid for swelling rash of the face.  The symptoms of increased throat pain with "white patches" is new and warrants a reevaluation.  Please advise patient to come into the clinic tomorrow, follow-up with PCP or go to the ED.

## 2020-04-02 NOTE — Telephone Encounter (Signed)
Patient left a message stating that she was put on an antibiotic when she was seen here on 03/28/19.  Patient states that she is having white patches on the inside of her mouth for the past couple of days.  Patient asked if a medication could be sent in for this.

## 2020-04-02 NOTE — Telephone Encounter (Signed)
Patient states that she thinks the white patches in her mouth are caused by the Prednisone.  Patient was notified by phone that Laurene Footman, PA recommended that she follow-up here for further assessment and treatment if needed.  Patient verbalized understanding.

## 2020-04-03 ENCOUNTER — Other Ambulatory Visit: Payer: Self-pay

## 2020-04-03 ENCOUNTER — Telehealth: Payer: Self-pay | Admitting: *Deleted

## 2020-04-03 ENCOUNTER — Ambulatory Visit
Admission: EM | Admit: 2020-04-03 | Discharge: 2020-04-03 | Disposition: A | Discharge status: Home / Self Care | Payer: Medicare HMO | Attending: Internal Medicine | Admitting: Internal Medicine

## 2020-04-03 DIAGNOSIS — K1379 Other lesions of oral mucosa: Secondary | ICD-10-CM | POA: Diagnosis not present

## 2020-04-03 MED ORDER — NYSTATIN 100000 UNIT/ML MT SUSP: 10.0000 mL | Freq: Three times a day (TID) | OROMUCOSAL | 0 refills | Status: DC

## 2020-04-03 NOTE — Telephone Encounter (Signed)
Called pt and her insurance has changed from [employer] [place] to [facility]. She is now smoking 4 cig. A day. Her hx was 1.5PPD for 57 years. She does not have any cancer or no cancer work up. She is accepted the appt for 3/18 4:30. Gave pt phone number and address for outpatient [facility] location

## 2020-04-03 NOTE — ED Triage Notes (Signed)
Pt c/o possible thrush since taking oral prednisone last week. Pt states her mouth has been increasingly sore over the past 3 days.

## 2020-04-03 NOTE — Discharge Instructions (Addendum)
Use medications as prescribed If you have worsening symptoms please return to urgent care to be reevaluated.

## 2020-04-04 ENCOUNTER — Other Ambulatory Visit: Payer: Self-pay | Admitting: *Deleted

## 2020-04-04 DIAGNOSIS — Z87891 Personal history of nicotine dependence: Secondary | ICD-10-CM

## 2020-04-04 DIAGNOSIS — Z122 Encounter for screening for malignant neoplasm of respiratory organs: Secondary | ICD-10-CM

## 2020-04-04 DIAGNOSIS — F17200 Nicotine dependence, unspecified, uncomplicated: Secondary | ICD-10-CM

## 2020-04-04 NOTE — Progress Notes (Signed)
Contacted and scheduled for annual lung screening scan. Patient is a current smoker with a 85.75 pack year history.

## 2020-04-05 NOTE — ED Provider Notes (Signed)
[facility]    CSN: [id number] Arrival date & time: 04/03/20  0911      History   Chief Complaint Chief Complaint  Patient presents with  . mouth pain    HPI Beth [name] is a 75 y.o. female comes to the urgent care complaining of oral pain which started 3 days ago.  Patient complains of multiple ulcerations and whitish patch in her mouth over the past few days.  She has been started on prednisone about a week ago.  Patient denies any nausea vomiting or diarrhea. No skin rash. No lymph gland swelling.   HPI  Past Medical History:  Diagnosis Date  . Allergy   . Anxiety   . Asthma   . Chronic pain syndrome    discharged from pain clinic, hx of narcotics seeking behavior  . COPD (chronic obstructive pulmonary disease) ([place])   . CVA (cerebral infarction)   . Depression   . Fibromyalgia   . Headache   . Hyperlipidemia   . Hypertension   . IBS (irritable bowel syndrome)   . Stroke ([place])   . Vitamin D deficiency     Patient Active Problem List   Diagnosis Date Noted  . Rash 01/14/2020  . Pulmonary hypertension ([place]) 09/14/2019  . Coccydynia 07/29/2019  . Cervicalgia 05/04/2019  . Acute pain of left shoulder 05/04/2019  . Elbow pain, left 05/04/2019  . DOE (dyspnea on exertion) 03/23/2019  . Leg swelling 03/22/2019    Class: Acute  . Lumbar degenerative disc disease 09/10/2018  . Left hip pain 09/10/2018  . Post-traumatic osteoarthritis of left elbow 07/14/2018  . Lumbar radiculopathy 02/12/2018  . Lumbar spondylosis 02/12/2018  . Lumbar facet arthropathy 02/12/2018  . Atherosclerosis of aorta ([place]) 09/17/2017  . Lung nodule 09/17/2017  . Urinary tract infection 12/05/2016  . Protein-calorie malnutrition, mild ([place]) 08/07/2016  . Generalized anxiety disorder 07/15/2016  . Chronic pain syndrome 07/15/2016  . Severe episode of recurrent major depressive disorder, without psychotic features ([place]) 07/14/2016  . Moderate benzodiazepine use disorder  ([place]) 05/08/2016  . Major depressive disorder, recurrent, severe w/o psychotic behavior ([place]) 05/01/2016  . Seasonal allergic rhinitis 03/23/2015  . Hyperglycemia 03/23/2015  . Senile purpura ([place]) 03/23/2015  . Perennial allergic rhinitis with seasonal variation 03/23/2015  . Marital problems 10/31/2014  . Migraine without aura and without status migrainosus, not intractable 10/31/2014  . Chronic bilateral low back pain without sciatica 08/09/2014  . Colon polyp 08/09/2014  . COPD, severe ([place]) 08/09/2014  . CVA, old, hemiparesis ([place]) 08/09/2014  . Dyslipidemia 08/09/2014  . Dysfunction of eustachian tube 08/09/2014  . Fibromyalgia syndrome 08/09/2014  . Gastro-esophageal reflux disease without esophagitis 08/09/2014  . Benign migrating glossitis 08/09/2014  . Cerebrovascular accident, old 08/09/2014  . IBS (irritable bowel syndrome) 08/09/2014  . Low back pain with radiation 08/09/2014  . Chronic recurrent major depressive disorder ([place]) 08/09/2014  . Dysmetabolic syndrome 39/76/7341  . OP (osteoporosis) 08/09/2014  . Vitamin D deficiency 08/09/2014  . Smoking 08/09/2014  . Benign hypertension 07/19/2013  . Benign neoplasm of skin of trunk 06/03/2013  . H/O: pneumonia 09/25/2012    Past Surgical History:  Procedure Laterality Date  . ABDOMINAL HYSTERECTOMY    . APPENDECTOMY    . BREAST EXCISIONAL BIOPSY  2011   Pt states lump removed, ? side, no scar seen  . BREAST SURGERY  2011   biopsy  . CERVICAL DISCECTOMY    . CHOLECYSTECTOMY    . SINUSOTOMY  OB History   No obstetric history on file.      Home Medications    Prior to Admission medications   Medication Sig Start Date End Date Taking? Authorizing Provider  acetaminophen (TYLENOL) 500 MG tablet Take 500 mg by mouth 2 (two) times daily as needed.    Yes [provider]  aspirin 81 MG chewable tablet Chew 81 mg daily by mouth.   Yes [provider]  atorvastatin (LIPITOR) 40 MG tablet  TAKE 1 TABLET BY MOUTH EVERY DAY 01/14/20  Yes Malfi, Lupita Raider, FNP  azelastine (ASTELIN) 0.1 % nasal spray Place 2 sprays into both nostrils 2 (two) times daily. 04/10/18  Yes Mikey College, NP  clonazePAM (KLONOPIN) 0.5 MG tablet TK 1 T PO DAILY PRN 04/01/18  Yes [provider]  COMBIVENT RESPIMAT 20-100 MCG/ACT AERS respimat INHALE 1 PUFF BY MOUTH EVERY 6 HOURS 01/04/19  Yes Karamalegos, [name], DO  FLUoxetine (PROZAC) 20 MG tablet Take 60 mg by mouth daily.    Yes [provider]  Fluticasone-Umeclidin-Vilant (TRELEGY ELLIPTA) 200-62.5-25 MCG/INH AEPB Inhale 1 puff into the lungs daily. 09/14/19  Yes Parrett, Tammy S, NP  furosemide (LASIX) 20 MG tablet Take 1 tablet (20 mg total) by mouth daily. 03/25/19 11/15/19 Yes Agbor-Etang, Aaron Edelman, MD  gabapentin (NEURONTIN) 300 MG capsule Take 2 capsules (600 mg total) by mouth 3 (three) times daily. 12/14/19 06/11/20 Yes [name], MD  magic mouthwash (nystatin, lidocaine, diphenhydrAMINE, alum & mag hydroxide) suspension Swish and swallow 10 mLs 3 (three) times daily for 7 days. 04/03/20 04/10/20 Yes Karalynn Cottone, Myrene Galas, MD  Melatonin 3 MG CAPS Take 10 mg by mouth at bedtime as needed (sleep).    Yes [provider]  metoprolol succinate (TOPROL-XL) 25 MG 24 hr tablet TAKE 1/2 TABLET(12.5 MG) BY MOUTH DAILY 09/03/19  Yes Malfi, Lupita Raider, FNP  montelukast (SINGULAIR) 10 MG tablet TAKE 1 TABLET(10 MG) BY MOUTH AT BEDTIME 12/12/19  Yes Malfi, Lupita Raider, FNP  SEROQUEL 100 MG tablet Take 100 mg by mouth at bedtime. 03/11/19  Yes [provider]  triamcinolone ointment (KENALOG) 0.5 % Apply 1 application topically 2 (two) times daily. 01/14/20  Yes Malfi, Lupita Raider, FNP  hydrOXYzine (ATARAX/VISTARIL) 25 MG tablet Take 25-50 mg by mouth daily as needed. Patient not taking: No sig reported 06/17/19   [provider]    Family History Family History  Problem Relation Age of Onset  . Anxiety disorder Mother   .  Depression Mother   . Breast cancer Mother 67  . Cancer Father   . Gallbladder disease Father   . Alcohol abuse Father   . Depression Father     Social History Social History   Tobacco Use  . Smoking status: Current Every Day Smoker    Packs/day: 0.25    Years: 57.00    Pack years: 14.25    Types: Cigarettes  . Smokeless tobacco: Never Used  . Tobacco comment: .5ppd currently  Vaping Use  . Vaping Use: Never used  Substance Use Topics  . Alcohol use: No    Alcohol/week: 0.0 standard drinks  . Drug use: No     Allergies   Bextra  [valdecoxib], Compazine [prochlorperazine edisylate], Lithium carbonate, and Lyrica [pregabalin]   Review of Systems Review of Systems  Constitutional: Negative.   HENT: Positive for mouth sores. Negative for ear pain.   Respiratory: Negative.   Neurological: Negative.      Physical Exam Triage Vital Signs  ED Triage Vitals  Enc Vitals Group     BP 04/03/20 0939 95/75     Pulse Rate 04/03/20 0939 81     Resp 04/03/20 0939 18     Temp 04/03/20 0939 98.2 F (36.8 C)     Temp Source 04/03/20 0939 Oral     SpO2 04/03/20 0939 95 %     Weight 04/03/20 0937 158 lb (71.7 kg)     Height 04/03/20 0937 5\' 5"  (1.651 m)     Head Circumference --      Peak Flow --      Pain Score 04/03/20 0937 7     Pain Loc --      Pain Edu? --      Excl. in [place]? --    No data found.  Updated Vital Signs BP 95/75 (BP Location: Left Arm)   Pulse 81   Temp 98.2 F (36.8 C) (Oral)   Resp 18   Ht 5\' 5"  (1.651 m)   Wt 71.7 kg   SpO2 95%   BMI 26.29 kg/m   Visual Acuity Right Eye Distance:   Left Eye Distance:   Bilateral Distance:    Right Eye Near:   Left Eye Near:    Bilateral Near:     Physical Exam Vitals and nursing note reviewed.  HENT:     Mouth/Throat:     Mouth: Mucous membranes are moist.     Comments: Multiple shallow ulcerations with white patches  Neurological:     Mental Status: She is alert.      UC Treatments /  Results  Labs (all labs ordered are listed, but only abnormal results are displayed) Labs Reviewed - No data to display  EKG   Radiology No results found.  Procedures Procedures (including critical care time)  Medications Ordered in UC Medications - No data to display  Initial Impression / Assessment and Plan / UC Course  I have reviewed the triage vital signs and the nursing notes.  Pertinent labs & imaging results that were available during my care of the patient were reviewed by me and considered in my medical decision making (see chart for details).     1. Mouth ulcers: Mouth wash QID for relief. Tylenol as needed for pain Return precautions given Final Clinical Impressions(s) / UC Diagnoses   Final diagnoses:  Mouth sores     Discharge Instructions     Use medications as prescribed If you have worsening symptoms please return to urgent care to be reevaluated.   ED Prescriptions    Medication Sig Dispense Auth. Provider   magic mouthwash (nystatin, lidocaine, diphenhydrAMINE, alum & mag hydroxide) suspension Swish and swallow 10 mLs 3 (three) times daily for 7 days. 240 mL Sophiarose Eades, Myrene Galas, MD     PDMP not reviewed this encounter.   Chase Picket, MD 04/05/20 [phone]

## 2020-04-06 ENCOUNTER — Ambulatory Visit (INDEPENDENT_AMBULATORY_CARE_PROVIDER_SITE_OTHER): Payer: Medicaid Other | Admitting: General Practice

## 2020-04-06 ENCOUNTER — Telehealth: Payer: Medicare HMO | Admitting: General Practice

## 2020-04-06 DIAGNOSIS — E785 Hyperlipidemia, unspecified: Secondary | ICD-10-CM | POA: Diagnosis not present

## 2020-04-06 DIAGNOSIS — J449 Chronic obstructive pulmonary disease, unspecified: Secondary | ICD-10-CM | POA: Diagnosis not present

## 2020-04-06 DIAGNOSIS — F17200 Nicotine dependence, unspecified, uncomplicated: Secondary | ICD-10-CM

## 2020-04-06 DIAGNOSIS — I10 Essential (primary) hypertension: Secondary | ICD-10-CM

## 2020-04-06 DIAGNOSIS — W19XXXA Unspecified fall, initial encounter: Secondary | ICD-10-CM

## 2020-04-06 DIAGNOSIS — F332 Major depressive disorder, recurrent severe without psychotic features: Secondary | ICD-10-CM

## 2020-04-06 DIAGNOSIS — F411 Generalized anxiety disorder: Secondary | ICD-10-CM

## 2020-04-06 NOTE — Chronic Care Management (AMB) (Signed)
Chronic Care Management   CCM RN Visit Note  04/06/2020 Name: Beth [name] MRN: [id number] DOB: 1945/11/11  Subjective: Beth [name] is a 75 y.o. year old female who is a primary care patient of Lorine Bears, Lupita Raider, FNP. The care management team was consulted for assistance with disease management and care coordination needs.    Engaged with patient by telephone for follow up visit in response to provider referral for case management and/or care coordination services.   Consent to Services:  The patient was given information about Chronic Care Management services, agreed to services, and gave verbal consent prior to initiation of services.  Please see initial visit note for detailed documentation.   Patient agreed to services and verbal consent obtained.   Assessment: Review of patient past medical history, allergies, medications, health status, including review of consultants reports, laboratory and other test data, was performed as part of comprehensive evaluation and provision of chronic care management services.   SDOH (Social Determinants of Health) assessments and interventions performed:    CCM Care Plan  Allergies  Allergen Reactions   Bextra  [Valdecoxib]    Compazine [Prochlorperazine Edisylate]     Stroke-like symptoms   Lithium Carbonate     Leg weakness   Lyrica [Pregabalin]     Outpatient Encounter Medications as of 04/06/2020  Medication Sig Note   acetaminophen (TYLENOL) 500 MG tablet Take 500 mg by mouth 2 (two) times daily as needed.     aspirin 81 MG chewable tablet Chew 81 mg daily by mouth.    atorvastatin (LIPITOR) 40 MG tablet TAKE 1 TABLET BY MOUTH EVERY DAY    azelastine (ASTELIN) 0.1 % nasal spray Place 2 sprays into both nostrils 2 (two) times daily.    clonazePAM (KLONOPIN) 0.5 MG tablet TK 1 T PO DAILY PRN    COMBIVENT RESPIMAT 20-100 MCG/ACT AERS respimat INHALE 1 PUFF BY MOUTH EVERY 6 HOURS    FLUoxetine (PROZAC) 20 MG tablet Take  60 mg by mouth daily.     Fluticasone-Umeclidin-Vilant (TRELEGY ELLIPTA) 200-62.5-25 MCG/INH AEPB Inhale 1 puff into the lungs daily.    furosemide (LASIX) 20 MG tablet Take 1 tablet (20 mg total) by mouth daily.    gabapentin (NEURONTIN) 300 MG capsule Take 2 capsules (600 mg total) by mouth 3 (three) times daily. 02/02/2020: Reports taking 2 capsules (600 mg) twice daily   hydrOXYzine (ATARAX/VISTARIL) 25 MG tablet Take 25-50 mg by mouth daily as needed. (Patient not taking: No sig reported)    magic mouthwash (nystatin, lidocaine, diphenhydrAMINE, alum & mag hydroxide) suspension Swish and swallow 10 mLs 3 (three) times daily for 7 days.    Melatonin 3 MG CAPS Take 10 mg by mouth at bedtime as needed (sleep).     metoprolol succinate (TOPROL-XL) 25 MG 24 hr tablet TAKE 1/2 TABLET(12.5 MG) BY MOUTH DAILY    montelukast (SINGULAIR) 10 MG tablet TAKE 1 TABLET(10 MG) BY MOUTH AT BEDTIME    SEROQUEL 100 MG tablet Take 100 mg by mouth at bedtime.    triamcinolone ointment (KENALOG) 0.5 % Apply 1 application topically 2 (two) times daily.    Facility-Administered Encounter Medications as of 04/06/2020  Medication   albuterol (PROVENTIL) (2.5 MG/3ML) 0.083% nebulizer solution 2.5 mg    Patient Active Problem List   Diagnosis Date Noted   Rash 01/14/2020   Pulmonary hypertension ([place]) 09/14/2019   Coccydynia 07/29/2019   Cervicalgia 05/04/2019   Acute pain of left shoulder 05/04/2019   Elbow pain,  left 05/04/2019   DOE (dyspnea on exertion) 03/23/2019   Leg swelling 03/22/2019    Class: Acute   Lumbar degenerative disc disease 09/10/2018   Left hip pain 09/10/2018   Post-traumatic osteoarthritis of left elbow 07/14/2018   Lumbar radiculopathy 02/12/2018   Lumbar spondylosis 02/12/2018   Lumbar facet arthropathy 02/12/2018   Atherosclerosis of aorta ([place]) 09/17/2017   Lung nodule 09/17/2017   Urinary tract infection 12/05/2016   Protein-calorie malnutrition,  mild (HCC) 08/07/2016   Generalized anxiety disorder 07/15/2016   Chronic pain syndrome 07/15/2016   Severe episode of recurrent major depressive disorder, without psychotic features ([place]) 07/14/2016   Moderate benzodiazepine use disorder ([place]) 05/08/2016   Major depressive disorder, recurrent, severe w/o psychotic behavior ([place]) 05/01/2016   Seasonal allergic rhinitis 03/23/2015   Hyperglycemia 03/23/2015   Senile purpura ([place]) 03/23/2015   Perennial allergic rhinitis with seasonal variation 03/23/2015   Marital problems 10/31/2014   Migraine without aura and without status migrainosus, not intractable 10/31/2014   Chronic bilateral low back pain without sciatica 08/09/2014   Colon polyp 08/09/2014   COPD, severe ([place]) 08/09/2014   CVA, old, hemiparesis ([place]) 08/09/2014   Dyslipidemia 08/09/2014   Dysfunction of eustachian tube 08/09/2014   Fibromyalgia syndrome 08/09/2014   Gastro-esophageal reflux disease without esophagitis 08/09/2014   Benign migrating glossitis 08/09/2014   Cerebrovascular accident, old 08/09/2014   IBS (irritable bowel syndrome) 08/09/2014   Low back pain with radiation 08/09/2014   Chronic recurrent major depressive disorder ([place]) 35/57/3220   Dysmetabolic syndrome 25/42/7062   OP (osteoporosis) 08/09/2014   Vitamin D deficiency 08/09/2014   Smoking 08/09/2014   Benign hypertension 07/19/2013   Benign neoplasm of skin of trunk 06/03/2013   H/O: pneumonia 09/25/2012    Conditions to be addressed/monitored:HLD, COPD, Anxiety, Depression and smoker  Care Plan : RNCM: HLD Management  Updates made by Vanita Ingles since 04/06/2020 12:00 AM    Problem: RNCM: HLD Management   Priority: Medium    Long-Range Goal: RNCM: HLD Mangement   Priority: Medium  Note:   Current Barriers:   Poorly controlled hyperlipidemia, complicated by COPD, smoker  Current antihyperlipidemic regimen: Lipitor 40 mg QD  Most recent lipid panel:      Component Value Date/Time   CHOL 135 04/10/2018 1115   CHOL 118 03/23/2015 1536   CHOL 185 12/25/2012 1449   TRIG 50 04/10/2018 1115   TRIG 93 12/25/2012 1449   HDL 59 04/10/2018 1115   HDL 44 03/23/2015 1536   HDL 39 (L) 12/25/2012 1449   CHOLHDL 2.3 04/10/2018 1115   VLDL 18 03/21/2016 0936   VLDL 19 12/25/2012 1449   LDLCALC 64 04/10/2018 1115   LDLCALC 127 (H) 12/25/2012 1449     ASCVD risk enhancing conditions: age >40,  HTN,  current smoker  Lacks Scientist, product/process development  Does not maintain contact with provider office  Does not contact provider office for questions/concerns RN Care Manager Clinical Goal(s):   patient will work with Consulting civil engineer, providers, and care team towards execution of optimized self-health management plan  patient will verbalize understanding of plan for effective management of HLD   patient will work with RNCM and pcp  to address needs related to management of HLD Interventions:  Collaboration with [place], Lupita Raider, FNP regarding development and update of comprehensive plan of care as evidenced by provider attestation and co-signature  Inter-disciplinary care team collaboration (see longitudinal plan of care)  Medication review performed; medication list updated in electronic medical  record.   Inter-disciplinary care team collaboration (see longitudinal plan of care)  Referred to pharmacy team for assistance with HLD medication management  Evaluation of current treatment plan related to HLD and patient's adherence to plan as established by provider.  Advised patient to call the office for changes in condition or questions   Provided education to patient re: heart healthy diet, medication compliance, and working with CCM team to manage health and well being.   Discussed plans with patient for ongoing care management follow up and provided patient with direct contact information for care management team Patient Goals/Self-Care  Activities: - call for medicine refill 2 or 3 days before it runs out - call if I am sick and can't take my medicine - keep a list of all the medicines I take; vitamins and herbals too - learn to read medicine labels - use a pillbox to sort medicine - use an alarm clock or phone to remind me to take my medicine - change to whole grain breads, cereal, pasta - drink 6 to 8 glasses of water each day - eat 3 to 5 servings of fruits and vegetables each day - eat 5 or 6 small meals each day - fill half the plate with nonstarchy vegetables - limit fast food meals to no more than 1 per week - manage portion size - prepare main meal at home 3 to 5 days each week - read food labels for fat, fiber, carbohydrates and portion size - be open to making changes - I can manage, know and watch for signs of a heart attack - if I have chest pain, call for help - learn about small changes that will make a big difference - learn my personal risk factors - barriers to meeting goals identified - change-talk evoked - choices provided - collaboration with team encouraged - decision-making supported - health risks reviewed - problem-solving facilitated - questions answered - readiness for change evaluated - reassurance provided - resources needed to meet goals identified - self-reflection promoted - self-reliance encouraged - verbalization of feelings encouraged  Follow Up Plan: Telephone follow up appointment with care management team member scheduled for:06-08-2020 at 230 pm     Task: RNCM: HLD Management   Note:   Care Management Activities:    - barriers to meeting goals identified - change-talk evoked - choices provided - collaboration with team encouraged - decision-making supported - health risks reviewed - problem-solving facilitated - questions answered - readiness for change evaluated - reassurance provided - resources needed to meet goals identified - self-reflection promoted -  self-reliance encouraged - verbalization of feelings encouraged       Care Plan : RNCM: Depression (Adult) and Anxiety  Updates made by Vanita Ingles since 04/06/2020 12:00 AM    Problem: RNCM: Depression Identification (Depression) and anxiety   Priority: Medium    Long-Range Goal: RNCM: Depressive Symptoms Identified and Anxiety   Priority: Medium  Note:   Current Barriers:   Knowledge Deficits related to resources to help with finding a new psychiatrist in the are to help with effective management of depression and anxiety   Chronic Disease Management support and education needs related to effective management of depression and anxiety   Lacks caregiver support.   Difficulty obtaining medications  Unable to independently manage depression and anxiety as evidence of the need for a new psychiatrist in [place] to self administer medications as prescribed  Lacks social connections  Does not maintain  contact with provider office  Does not contact provider office for questions/concerns  Nurse Case Manager Clinical Goal(s):   patient will verbalize understanding of plan for effective management of depression and anxiety   patient will work with RNCM, pcp, and CCM team  to address needs related to depression ad anxiety   patient will demonstrate improved adherence to prescribed treatment plan for depression and anxiety  as evidenced byno exacerbations in depression/anxiety or changes in mood  Interventions:   1:1 collaboration with Lorine Bears, Lupita Raider, FNP regarding development and update of comprehensive plan of care as evidenced by provider attestation and co-signature  Inter-disciplinary care team collaboration (see longitudinal plan of care)  Evaluation of current treatment plan related to depression and anxiety  and patient's adherence to plan as established by provider.  Advised patient to work with CCM team to meet depression and anxiety needs. Has  talked with LCSW recently. Discussed calling the office for changes in mood, anxiety, or depression   Provided education to patient re: effective ways to manage depression and anxiety   Reviewed medications with patient and discussed compliance. States she is in need of some refills  Collaborated with RNCM, pcp, and CCM team  regarding patient saying she is out of clonazepam and montelukast and her psychiatrist in [place] says she is too far and will not see her or refill medications. The patient is receptive to working with CCM team to get a new psychiatrist.   Social Work referral for help with new psychiatrist   Pharmacy referral for medication management   Discussed plans with patient for ongoing care management follow up and provided patient with direct contact information for care management team  Patient Goals/Self-Care Activities Over the next 120 days, patient will:  - Patient will self administer medications as prescribed Patient will attend all scheduled provider appointments Patient will call pharmacy for medication refills Patient will attend church or other social activities Patient will continue to perform ADL's independently Patient will continue to perform IADL's independently Patient will call provider office for new concerns or questions Patient will work with BSW to address care coordination needs and will continue to work with the clinical team to address health care and disease management related needs.    - anxiety screen reviewed - depression screen reviewed - medication list reviewed - participation in psychiatric services encouraged Follow Up Plan: Telephone follow up appointment with care management team member scheduled for:06-08-2020 at 230 pm       Task: RNCM: Identify Depressive Symptoms and Facilitate Treatment   Note:   Care Management Activities:    - anxiety screen reviewed - depression screen reviewed - medication list reviewed -  participation in psychiatric services encouraged       Care Plan : RNCM: COPD (Adult)  Updates made by Vanita Ingles since 04/06/2020 12:00 AM    Problem: RNCM; Psychological Adjustment to Diagnosis (COPD)   Priority: Medium    Long-Range Goal: RNCM:  Adjustment to Disease Achieved   Priority: Medium  Note:   Current Barriers:   Knowledge deficits related to basic understanding of COPD disease process  Knowledge deficits related to basic COPD self care/management  Knowledge deficit related to basic understanding of how to use inhalers and how inhaled medications work  Knowledge deficit related to importance of energy conservation  Lacks social connections  Does not maintain contact with provider office  Does not contact provider office for questions/concerns  Case Manager Clinical Goal(s):  patient will report  using inhalers as prescribed including rinsing mouth after use  patient will report utilizing pursed lip breathing for shortness of breath  patient will verbalize understanding of COPD action plan and when to seek appropriate levels of medical care  patient will engage in lite exercise as tolerated to build/regain stamina and strength and reduce shortness of breath through activity tolerance  patient will verbalize basic understanding of COPD disease process and self care activities  patient will not be hospitalized for COPD exacerbation as evidenced  Interventions:   Collaboration with Malfi, Lupita Raider, FNP regarding development and update of comprehensive plan of care as evidenced by provider attestation and co-signature  Inter-disciplinary care team collaboration (see longitudinal plan of care)  Provided patient with basic written and verbal COPD education on self care/management/and exacerbation prevention   Provided patient with COPD action plan and reinforced importance of daily self assessment  Provided written and verbal instructions on pursed lip  breathing and utilized returned demonstration as teach back  Provided instruction about proper use of medications used for management of COPD including inhalers  Advised patient to self assesses COPD action plan zone and make appointment with provider if in the yellow zone for 48 hours without improvement.  Provided patient with education about the role of exercise in the management of COPD  Advised patient to engage in light exercise as tolerated 3-5 days a week  Provided education about and advised patient to utilize infection prevention strategies to reduce risk of respiratory infection  Patient Goals/Self-Care Activities:   - counseling provided  - decision-making supported  - depression screen reviewed  - emotional support provided  - family involvement promoted  - problem-solving facilitated  - relaxation techniques promoted  - verbalization of feelings encouraged Follow Up Plan: Telephone follow up appointment with care management team member scheduled for: 06-08-2020 AT 12 PM   Task: RNCM: Support Psychosocial Response to Chronic Obstructive Pulmonary Disease   Note:   Care Management Activities:    - counseling provided - decision-making supported - depression screen reviewed - emotional support provided - family involvement promoted - problem-solving facilitated - relaxation techniques promoted - verbalization of feelings encouraged       Problem: RNCM: Smoking Cessation   Priority: Medium    Long-Range Goal: RNCM: Smoking Cessation   Priority: Medium  Note:   Current Barriers:   Unable to independently stop smoking  Lacks social connections  Does not maintain contact with provider office  Does not contact provider office for questions/concerns  Tobacco abuse of 55 years; currently smoking .25 ppd pack years 14.25  Previous quit attempts, unsuccessful none successful using non   Reports smoking within 30 minutes of waking up  Reports  triggers to smoke include: stress and anxiety   Reports motivation to quit smoking includes: knows will help her health but does not desire to quit   On a scale of 1-10, reports MOTIVATION to quit is no desire to quit  On a scale of 1-10, reports CONFIDENCE in quitting is no desire to quit Clinical Goal(s):   Over the next 120 days, patient will work with Chief Operating Officer and provider towards tobacco cessation  Interventions:  Collaboration with Malfi, Lupita Raider, FNP regarding development and update of comprehensive plan of care as evidenced by provider attestation and co-signature  Inter-disciplinary care team collaboration (see longitudinal plan of care)  Evaluation of current treatment plan reviewed  Provided contact information for [place] [facility]uit Line (1-800-QUIT-NOW). Patient  will outreach this group for support.  Discussed plans with patient for ongoing care management follow up and provided patient with direct contact information for care management team  Provided patient with printed smoking cessation educational materials  Referred patient to pharmacy team  Provided contact information for [place] [facility]uit Line (1-800-QUIT-NOW). Patient will outreach this group for support.  Evaluation of current treatment plan reviewed Patient Goals/Self-Care Activities  Over the next 120 days, patient will:  - activities  as a habit during cravings  - verbally commit to reducing tobacco consumption - barriers to lifestyle changes reviewed and addressed - barriers to treatment reviewed and addressed - healthy lifestyle promoted - modification of home and work environment promoted - rescue (action) plan reviewed - signs/symptoms of infection reviewed - signs/symptoms of worsening disease assessed - symptom triggers identified - treatment plan reviewed Follow Up Plan: Telephone follow up appointment with care management team member scheduled for: 06-08-2020  at 230 pm   Task: RNCM: Identify and Minimize Risk of COPD Exacerbation   Note:   Care Management Activities:    - barriers to lifestyle changes reviewed and addressed - barriers to treatment reviewed and addressed - healthy lifestyle promoted - modification of home and work environment promoted - rescue (action) plan reviewed - signs/symptoms of infection reviewed - signs/symptoms of worsening disease assessed - symptom triggers identified - treatment plan reviewed       Care Plan : RNCM: Fall Risk (Adult)  Updates made by Vanita Ingles since 04/06/2020 12:00 AM    Problem: RNCM: Fall Risk   Priority: High    Goal: RNCM: Absence of Fall and Fall-Related Injury   Priority: High  Note:   Current Barriers:   Knowledge Deficits related to fall precautions in patient with multiple chronic conditions.   Decreased adherence to prescribed treatment for fall prevention  Unable to independently manage falls   Lacks social connections  Does not contact provider office for questions/concerns  Knowledge Deficits related to resources to help with making home safe   Chronic Disease Management support and education needs related to falls prevention and safety   Lacks caregiver support.  Clinical Goal(s):   patient will demonstrate improved adherence to prescribed treatment plan for decreasing falls as evidenced by patient reporting and review of EMR  patient will verbalize using fall risk reduction strategies discussed  patient will not experience additional falls  patient will verbalize understanding of plan for remaining safe in home environment and fall prevention plan. Interventions:   Collaboration with Malfi, Lupita Raider, FNP regarding development and update of comprehensive plan of care as evidenced by provider attestation and co-signature  Inter-disciplinary care team collaboration (see longitudinal plan of care)  Provided written and verbal education re: Potential  causes of falls and Fall prevention strategies  Reviewed medications and discussed potential side effects of medications such as dizziness and frequent urination  Assessed for s/s of orthostatic hypotension. Review and education today   Assessed for falls since last encounter. Patient had a fall last week, today is 04-06-2020  Assessed patients knowledge of fall risk prevention secondary to previously provided education.  Assessed working status of life alert bracelet and patient adherence  Provided patient information for fall alert systems  Evaluation of current treatment plan related to falls prevention and safety  and patient's adherence to plan as established by provider.  Advised patient to call the office for new falls or injuries   Provided education to patient re: changing position slowly, reporting  increased light headedness or dizziness, and being safe in home environment   Discussed plans with patient for ongoing care management follow up and provided patient with direct contact information for care management team Self-Care Deficits:  Unable to independently prevent falls in her home environment  Lacks social connections Does not maintain contact with provider office Does not contact provider office for questions/concerns Patient Goals:  [place] safety precautions appropriately with all ambulation. Does not use a cane or a walker  - De-clutter walkways - Change positions slowly - Wear secure fitting shoes at all times with ambulation - Utilize home lighting for dim lit areas - Demonstrate self and pet awareness at all times - activities of daily living skills assessed - barriers to physical activity or exercise addressed - barriers to physical activity or exercise identified - barriers to safety identified - cognition assessed - cognitive-stimulating activities promoted - fall prevention plan reviewed and updated - fear of falling, loss of independence and pain  acknowledged - medication list reviewed - modification of home and work environment promoted - vision and/or hearing aid use promoted Follow Up Plan: Telephone follow up appointment with care management team member scheduled for: 06-08-2020 at 230 pm   Task: RNCM: Identify and Manage Contributors to Fall Risk   Note:   Care Management Activities:    - activities of daily living skills assessed - barriers to physical activity or exercise addressed - barriers to physical activity or exercise identified - barriers to safety identified - cognition assessed - cognitive-stimulating activities promoted - fall prevention plan reviewed and updated - fear of falling, loss of independence and pain acknowledged - medication list reviewed - modification of home and work environment promoted - vision and/or hearing aid use promoted         Plan:Telephone follow up appointment with care management team member scheduled for:  06-08-2020 at 230 pm   [place], MSN, [place] [employer] Mobile: [phone]

## 2020-04-06 NOTE — Patient Instructions (Signed)
Visit Information  PATIENT GOALS: Goals Addressed            This Visit's Progress   . COMPLETED: RNCM: I have a lot of health issues going on       [place] (see longtitudinal plan of care for additional care plan information)  Current Barriers: closing this goal and opening in new ELS . Chronic Disease Management support, education, and care coordination needs related to HTN, COPD, Depression, and Bilateral leg swelling  . Financial barriers . Mobility barriers   Clinical Goal(s) related to HTN, COPD, Depression, and bilateral leg swelling :  Over the next 120 days, patient will:  . Work with the care management team to address educational, disease management, and care coordination needs  . Begin or continue self health monitoring activities as directed today Measure and record blood pressure 5 times per week, Measure and record weight daily, and wear compression stockings to help with bilateral leg swelling  . Call provider office for new or worsened signs and symptoms Blood pressure findings outside established parameters, Weight outside established parameters, Shortness of breath, and New or worsened symptom related to bilateral leg swelling or depression . Call care management team with questions or concerns . Verbalize basic understanding of patient centered plan of care established today  Interventions related to HTN, COPD, Depression, and Bilateral lower leg swelling :  . Evaluation of current treatment plans and patient's adherence to plan as established by provider.  The patient is currently seeing cardiology and having testing done to determine if her heart is the reason for the swelling she is having and the shortness of breath. The patient recently had a stress test and and ECHO and her EF% is 60-65%.  12-30-2019: The patient is still having some swelling but it is not as bad. She states that she has good days and bad days with her breathing. She says if she walks for  long distances she can really tell.  . Assessed patient understanding of disease states.  09-16-2019: The patient states she saw the pulmonary provider recently and they have started her on a new inhaler. She has not gone to pick it up yet but will today. She is hoping this with help with her COPD and the episodes of shortness of breath she is having. She states her shortness of breath is about the same. Endorses using one pillow at night doubled up when sleeping. States she is better when it is not so hot outside.  12-30-2019: Breathing is stable today. She states she is going to have a procedure on 01-10-2020 where they put needles in her back to help with the back pain she is having. She states she has had this done before and sometimes it helps and sometimes it does not. The patient states that she is hopeful it will help with her pain and discomfort.  . Assessed patient's education and care coordination needs.  The patient verbalized she is using compression hose and that is helping with the swelling. The patient says she still has some but not as much. The patient advised to elevate her legs when sitting down. The patient admits she does not always do this but will try to start remembering to do this.12-30-2019. The patient states the swelling is about the same. She can not always elevate her legs but does when she can. Reminded the patient the importance of follow up with providers to keep symptoms managed. She says the swelling is better since  the weather is cooler.  . Provided the patient with contact information for the CCM team.  . Assessed for device needs. The patient does not have a blood pressure cuff or a scale to monitor blood pressure or weights. Review of resources available. Completed- the patient now has a blood pressure cuff and scale and is using daily. Today's weight is 157.6 and her blood pressure with pulse is 100/56.  Education on orthostatic hypotension and to change position slowly.  12-30-2019: The patient is checking her blood pressure regularly. States yesterday was 103/67. Weight is stable. She states she has gained some weight but not a lot. She states with Thanksgiving and Christmas she usually does gain a little.  . Provided disease specific education to patient: review of wearing of compression stockings (currently the patient is using compression stockings on bilateral legs.  Assessment reveals that the patient does not have any weeping from the legs or sores), elevation of feet and legs when sitting, the benefit of using blood pressure cuff to take daily readings- the patient is now checking blood pressure daily and recording- Today's is 100/56, and the benefit of using a scale to record daily weights- patient is doing daily weights and today is 157.6. Review of notification to the pcp/card for weight gain or loss +/- 3 pounds in one day or +/- 5 pounds in one week. The patient verbalized understanding. 12-30-2019: Review and support given to the patient.  Nash Dimmer with appropriate clinical care team members regarding patient needs: Pharmacy referral in place, LCSW referral in place. The CCM team to assist with meeting the patients health and wellness needs and goals by working with the pcp and interdisciplinary team. . Evaluation of pulmonary status. 12-30-2019: The patient endorses taking medications as prescribed. The patient states her breathing is stable at this time.  Will continue to monitor. The patient states she is doing well and denies any new concerns.  . Review of appointments: 12-30-2019: Has an upcoming appointment for injections with pain specialist on 01-10-2020.  No upcoming with pcp.  Knows to call the office for changes or questions.   Patient Self Care Activities related to HTN, COPD, Depression, and bilateral leg swelling :  . Patient is unable to independently self-manage chronic health conditions  Please see past updates related to this goal by  clicking on the "Past Updates" button in the selected goal      . RNCM: Prevent Falls and Injury       Follow Up Date 06-08-2020   - add more outdoor lighting - always use handrails on the stairs - always wear low-heeled or flat shoes or slippers with nonskid soles - call the doctor if I am feeling too drowsy - install bathroom grab bars - keep a flashlight by the bed - keep my cell phone with me always - learn how to get back up if I fall - make an emergency alert plan in case I fall - pick up clutter from the floors - use a nonslip pad with throw rugs, or remove them completely - use a nightlight in the bathroom    Why is this important?    Most falls happen when it is hard for you to walk safely. Your balance may be off because of an illness. You may have pain in your knees, hip or other joints.   You may be overly tired or taking medicines that make you sleepy. You may not be able to see or hear clearly.  Falls can lead to broken bones, bruises or other injuries.   There are things you can do to help prevent falling.     Notes: Patient states last fall was last week, today is 04-06-2020. Denies injuries       Patient Care Plan: General Social Work (Adult)    Problem Identified: Response to Treatment (Depression)     Long-Range Goal: Response to Treatment Maximized   Start Date: 02/17/2020  This Visit's Progress: On track  Recent Progress: On track  Priority: Medium  Note:   Evidence-based guidance:   Engage patient in conversation about the perceived benefits of mental health treatment and quality of therapeutic alliance with his/her mental health professional.   Assess for barriers to attending appointments, such as transportation, financial, sense of slow or little improvement and forgetfulness.   Consider patient resistance to treatment based on stigma related to mental health diagnosis.   Maintain a documented system of ongoing contacts with patient during the  first 6 to 12 months of treatment, as missed appointments and disengagement may signal deteriorating condition.   Provide anticipatory guidance about the risk of increased symptoms and potential psychiatric hospitalization for those who have a pattern of nonattendance at mental health appointments.   Re-screen for depressive symptoms at mutually identified intervals.   Notes:   Timeframe:  Long-Range Goal Priority:  Medium Start Date:  02/17/20                           Expected End Date:  05/17/20                    Follow Up Date-04/25/20   - avoid negative self-talk - develop a personal safety plan - develop a plan to deal with triggers like holidays, anniversaries - exercise at least 2 to 3 times per week - have a plan for how to handle bad days - journal feelings and what helps to feel better or worse - spend time or talk with others at least 2 to 3 times per week - spend time or talk with others every day - watch for early signs of feeling worse - write in journal every day    Why is this important?    Keeping track of your progress will help your treatment team find the right mix of medicine and therapy for you.   Write in your journal every day.   Day-to-day changes in depression symptoms are normal. It may be more helpful to check your progress at the end of each week instead of every day.     Current Barriers:  . Chronic Mental Health needs related to depression . Financial constraints related to managing health care expenses . Limited social support . ADL IADL limitations . Limited access to caregiver . Inability to perform ADL's independently . Suicidal Ideation/Homicidal Ideation: No  Clinical Social Work Goal(s):  Marland Kitchen Over the next 120 days, patient will work with SW bi-monthly by telephone or in person to reduce or manage symptoms related to depression . Over the next 120 days, patient will work with SW to address concerns related to decreasing depression and  increasing self-care  Interventions: . Patient interviewed and appropriate assessments performed: brief mental health assessment . CCM LCSW spoke to both patient and son on 03/21/20. Provided mental health counseling with regard to depression and anxiety. Patient reports managing her mental health well at this time. Patient shares that her anxiety is  worse than her depression at this time. Patient reports getting outside and getting fresh air helps with anxiety. LCSW educated patient on coping methods to implement into her daily life to combat depressive symptoms and stress. Patient denied any suicidal or homicidal ideations. Encouraged patient to implement deep breathing exercises into her daily routine due to ongoing stress. Patient reports that she is actively working on both her physical and mental health. She shares that her legs continue to swell even though she is now on Lasix. Patient reports that she is using her compression stockings during the day and elevating her legs at night as needed. Patient is still hopeful for some relief from the swelling.  . Patient reports that her psychiatrist in [place] wanted her to discontinue services and find a mental health provider that is closer to her location. Her psychiatrist referred her to a psychiatrist in [place], [place]. Patient is unable to state the name of this agency but reports that her initial appointment is on 02/21/20 and is telephonic. Patient reports that they had to reschedule this appointment for tomorrow at 11:15. Patient will complete new patient appointment with psychiatrist tomorrow. She confirms that this is a telephonic appointment. Patient will contact CCM LCSW if she has any issues with gaining new mental health services.   . Discussed plans with patient for ongoing care management follow up and provided patient with direct contact information for care management team . Provided education and assistance to client regarding Advanced  Directives. . A voluntary and extensive discussion about advanced care planning including explanation and discussion of advanced was undertaken with the patient. Explanation regarding healthcare proxy and living will was reviewed and packet with forms with explanation of how to fill them out was given.   . Provided education to patient/caregiver about Hospice and/or Palliative Care services . Patient reports that her rash is gone that she was concerned about in December of 2021. Marland Kitchen Patient reports that she remained safe during the recent snow storm and she drove herself to the grocery store yesterday. She reports that her son that lives in [place] and calls and visits her weekly to check on her needs. . Mindfulness or Relaxation Training provided during session for patient to implement when faced with future triggers . Patient reports implementing appropriate self-care by getting up each day and walking outside to get some fresh air. LCSW provided positive reinforcement for this accomplishment. . Patient denies any issues with her sleep routine. She reports that she takes a melatonin vitamin every night which helps her to unwind from the day.  . Son reports that patient is doing well and has improved her socialization by going out to eat and running errands with her neighbor friends that live in her same complex. Son continues to provide stable transportation to all of patient's medical appointments.   Patient Self Care Activities:  . Calls provider office for new concerns or questions . Ability for insight . Motivation for treatment  Patient Coping Strengths:  . Hopefulness . Self Advocate . Able to Communicate Effectively  Patient Self Care Deficits:  . Lacks social connections  Please see past updates related to this goal by clicking on the "Past Updates" button in the selected goal     Task: Facilitate Engagement in [place]   Note:   Care Management Activities:    -  attendance at mental health appointments reviewed - barriers to treatment reviewed and addressed - re-screen for depressive symptoms performed - perceived benefits  to therapy discussed - risk of unmanaged depression discussed    Notes:    Patient Care Plan: RNCM: HLD Management    Problem Identified: RNCM: HLD Management   Priority: Medium    Long-Range Goal: RNCM: HLD Mangement   Priority: Medium  Note:   Current Barriers:  . Poorly controlled hyperlipidemia, complicated by COPD, smoker . Current antihyperlipidemic regimen: Lipitor 40 mg QD . Most recent lipid panel:     Component Value Date/Time   CHOL 135 04/10/2018 1115   CHOL 118 03/23/2015 1536   CHOL 185 12/25/2012 1449   TRIG 50 04/10/2018 1115   TRIG 93 12/25/2012 1449   HDL 59 04/10/2018 1115   HDL 44 03/23/2015 1536   HDL 39 (L) 12/25/2012 1449   CHOLHDL 2.3 04/10/2018 1115   VLDL 18 03/21/2016 0936   VLDL 19 12/25/2012 1449   LDLCALC 64 04/10/2018 1115   LDLCALC 127 (H) 12/25/2012 1449 .   Marland Kitchen ASCVD risk enhancing conditions: age >65,  HTN,  current smoker . Lacks social connections . Does not maintain contact with provider office . Does not contact provider office for questions/concerns RN Care Manager Clinical Goal(s):  . patient will work with Consulting civil engineer, providers, and care team towards execution of optimized self-health management plan . patient will verbalize understanding of plan for effective management of HLD  . patient will work with [facility] and pcp  to address needs related to management of HLD Interventions: . Collaboration with Malfi, Lupita Raider, FNP regarding development and update of comprehensive plan of care as evidenced by provider attestation and co-signature . Inter-disciplinary care team collaboration (see longitudinal plan of care) . Medication review performed; medication list updated in electronic medical record.  Bertram Savin care team collaboration (see longitudinal plan of  care) . Referred to pharmacy team for assistance with HLD medication management . Evaluation of current treatment plan related to HLD and patient's adherence to plan as established by provider. . Advised patient to call the office for changes in condition or questions  . Provided education to patient re: heart healthy diet, medication compliance, and working with CCM team to manage health and well being.  . Discussed plans with patient for ongoing care management follow up and provided patient with direct contact information for care management team Patient Goals/Self-Care Activities: - call for medicine refill 2 or 3 days before it runs out - call if I am sick and can't take my medicine - keep a list of all the medicines I take; vitamins and herbals too - learn to read medicine labels - use a pillbox to sort medicine - use an alarm clock or phone to remind me to take my medicine - change to whole grain breads, cereal, pasta - drink 6 to 8 glasses of water each day - eat 3 to 5 servings of fruits and vegetables each day - eat 5 or 6 small meals each day - fill half the plate with nonstarchy vegetables - limit fast food meals to no more than 1 per week - manage portion size - prepare main meal at home 3 to 5 days each week - read food labels for fat, fiber, carbohydrates and portion size - be open to making changes - I can manage, know and watch for signs of a heart attack - if I have chest pain, call for help - learn about small changes that will make a big difference - learn my personal risk factors - barriers to meeting goals  identified - change-talk evoked - choices provided - collaboration with team encouraged - decision-making supported - health risks reviewed - problem-solving facilitated - questions answered - readiness for change evaluated - reassurance provided - resources needed to meet goals identified - self-reflection promoted - self-reliance encouraged -  verbalization of feelings encouraged  Follow Up Plan: Telephone follow up appointment with care management team member scheduled for:06-08-2020 at 230 pm     Task: RNCM: HLD Management   Note:   Care Management Activities:    - barriers to meeting goals identified - change-talk evoked - choices provided - collaboration with team encouraged - decision-making supported - health risks reviewed - problem-solving facilitated - questions answered - readiness for change evaluated - reassurance provided - resources needed to meet goals identified - self-reflection promoted - self-reliance encouraged - verbalization of feelings encouraged       Patient Care Plan: RNCM: Depression (Adult) and Anxiety    Problem Identified: RNCM: Depression Identification (Depression) and anxiety   Priority: Medium    Long-Range Goal: RNCM: Depressive Symptoms Identified and Anxiety   Priority: Medium  Note:   Current Barriers:  Marland Kitchen Knowledge Deficits related to resources to help with finding a new psychiatrist in the are to help with effective management of depression and anxiety  . Chronic Disease Management support and education needs related to effective management of depression and anxiety  . Lacks caregiver support.  . Difficulty obtaining medications . Unable to independently manage depression and anxiety as evidence of the need for a new psychiatrist in [place]  . Unable to self administer medications as prescribed . Lacks social connections . Does not maintain contact with provider office . Does not contact provider office for questions/concerns  Nurse Case Manager Clinical Goal(s):  . patient will verbalize understanding of plan for effective management of depression and anxiety  . patient will work with RNCM, pcp, and CCM team  to address needs related to depression ad anxiety  . patient will demonstrate improved adherence to prescribed treatment plan for depression and anxiety   as evidenced byno exacerbations in depression/anxiety or changes in mood  Interventions:  . 1:1 collaboration with Malfi, Lupita Raider, FNP regarding development and update of comprehensive plan of care as evidenced by provider attestation and co-signature . Inter-disciplinary care team collaboration (see longitudinal plan of care) . Evaluation of current treatment plan related to depression and anxiety  and patient's adherence to plan as established by provider. . Advised patient to work with CCM team to meet depression and anxiety needs. Has talked with LCSW recently. Discussed calling the office for changes in mood, anxiety, or depression  . Provided education to patient re: effective ways to manage depression and anxiety  . Reviewed medications with patient and discussed compliance. States she is in need of some refills . Collaborated with RNCM, pcp, and CCM team  regarding patient saying she is out of clonazepam and montelukast and her psychiatrist in [place] says she is too far and will not see her or refill medications. The patient is receptive to working with CCM team to get a new psychiatrist.  . Social Work referral for help with new psychiatrist  . Pharmacy referral for medication management  . Discussed plans with patient for ongoing care management follow up and provided patient with direct contact information for care management team  Patient Goals/Self-Care Activities Over the next 120 days, patient will:  - Patient will self administer medications as prescribed Patient will attend all scheduled  provider appointments Patient will call pharmacy for medication refills Patient will attend church or other social activities Patient will continue to perform ADL's independently Patient will continue to perform IADL's independently Patient will call provider office for new concerns or questions Patient will work with BSW to address care coordination needs and will continue to work with the  clinical team to address health care and disease management related needs.    - anxiety screen reviewed - depression screen reviewed - medication list reviewed - participation in psychiatric services encouraged Follow Up Plan: Telephone follow up appointment with care management team member scheduled for:06-08-2020 at 230 pm       Task: RNCM: Identify Depressive Symptoms and Facilitate Treatment   Note:   Care Management Activities:    - anxiety screen reviewed - depression screen reviewed - medication list reviewed - participation in psychiatric services encouraged       Patient Care Plan: RNCM: COPD (Adult)    Problem Identified: RNCM; Psychological Adjustment to Diagnosis (COPD)   Priority: Medium    Long-Range Goal: RNCM:  Adjustment to Disease Achieved   Priority: Medium  Note:   Current Barriers:  Marland Kitchen Knowledge deficits related to basic understanding of COPD disease process . Knowledge deficits related to basic COPD self care/management . Knowledge deficit related to basic understanding of how to use inhalers and how inhaled medications work . Knowledge deficit related to importance of energy conservation . Lacks social connections . Does not maintain contact with provider office . Does not contact provider office for questions/concerns  Case Manager Clinical Goal(s):  patient will report using inhalers as prescribed including rinsing mouth after use  patient will report utilizing pursed lip breathing for shortness of breath  patient will verbalize understanding of COPD action plan and when to seek appropriate levels of medical care  patient will engage in lite exercise as tolerated to build/regain stamina and strength and reduce shortness of breath through activity tolerance  patient will verbalize basic understanding of COPD disease process and self care activities  patient will not be hospitalized for COPD exacerbation as evidenced  Interventions:   . Collaboration with Malfi, Lupita Raider, FNP regarding development and update of comprehensive plan of care as evidenced by provider attestation and co-signature . Inter-disciplinary care team collaboration (see longitudinal plan of care)  Provided patient with basic written and verbal COPD education on self care/management/and exacerbation prevention   Provided patient with COPD action plan and reinforced importance of daily self assessment  Provided written and verbal instructions on pursed lip breathing and utilized returned demonstration as teach back  Provided instruction about proper use of medications used for management of COPD including inhalers  Advised patient to self assesses COPD action plan zone and make appointment with provider if in the yellow zone for 48 hours without improvement.  Provided patient with education about the role of exercise in the management of COPD  Advised patient to engage in light exercise as tolerated 3-5 days a week  Provided education about and advised patient to utilize infection prevention strategies to reduce risk of respiratory infection  Patient Goals/Self-Care Activities:  . - counseling provided . - decision-making supported . - depression screen reviewed . - emotional support provided . - family involvement promoted . - problem-solving facilitated . - relaxation techniques promoted . - verbalization of feelings encouraged Follow Up Plan: Telephone follow up appointment with care management team member scheduled for: 06-08-2020 AT 230 PM   Task: RNCM: Support Psychosocial Response  to Chronic Obstructive Pulmonary Disease   Note:   Care Management Activities:    - counseling provided - decision-making supported - depression screen reviewed - emotional support provided - family involvement promoted - problem-solving facilitated - relaxation techniques promoted - verbalization of feelings encouraged       Problem Identified: RNCM:  Smoking Cessation   Priority: Medium    Long-Range Goal: RNCM: Smoking Cessation   Priority: Medium  Note:   Current Barriers:  . Unable to independently stop smoking . Lacks social connections . Does not maintain contact with provider office . Does not contact provider office for questions/concerns . Tobacco abuse of 57 years; currently smoking .25 ppd pack years 14.25 . Previous quit attempts, unsuccessful none successful using non  . Reports smoking within 30 minutes of waking up . Reports triggers to smoke include: stress and anxiety  . Reports motivation to quit smoking includes: knows will help her health but does not desire to quit  . On a scale of 1-10, reports MOTIVATION to quit is no desire to quit . On a scale of 1-10, reports CONFIDENCE in quitting is no desire to quit Clinical Goal(s):  Marland Kitchen Over the next 120 days, patient will work with Comptroller Licensed Clinical Social Worker and provider towards tobacco cessation  Interventions: . Collaboration with Malfi, Lupita Raider, FNP regarding development and update of comprehensive plan of care as evidenced by provider attestation and co-signature . Inter-disciplinary care team collaboration (see longitudinal plan of care) . Evaluation of current treatment plan reviewed . Provided contact information for [place] [facility] (1-800-QUIT-NOW). Patient will outreach this group for support. . Discussed plans with patient for ongoing care management follow up and provided patient with direct contact information for care management team . Provided patient with printed smoking cessation educational materials . Referred patient to pharmacy team . Provided contact information for [place] [facility]uit [facility] (1-800-QUIT-NOW). Patient will outreach this group for support. . Evaluation of current treatment plan reviewed Patient Goals/Self-Care Activities . Over the next 120 days, patient will:  - activities  as a habit during cravings  - verbally  commit to reducing tobacco consumption - barriers to lifestyle changes reviewed and addressed - barriers to treatment reviewed and addressed - healthy lifestyle promoted - modification of home and work environment promoted - rescue (action) plan reviewed - signs/symptoms of infection reviewed - signs/symptoms of worsening disease assessed - symptom triggers identified - treatment plan reviewed Follow Up Plan: Telephone follow up appointment with care management team member scheduled for: 06-08-2020 at 230 pm   Task: RNCM: Identify and Minimize Risk of COPD Exacerbation   Note:   Care Management Activities:    - barriers to lifestyle changes reviewed and addressed - barriers to treatment reviewed and addressed - healthy lifestyle promoted - modification of home and work environment promoted - rescue (action) plan reviewed - signs/symptoms of infection reviewed - signs/symptoms of worsening disease assessed - symptom triggers identified - treatment plan reviewed       Patient Care Plan: RNCM: Fall Risk (Adult)    Problem Identified: RNCM: Fall Risk   Priority: High    Goal: RNCM: Absence of Fall and Fall-Related Injury   Priority: High  Note:   Current Barriers:  Marland Kitchen Knowledge Deficits related to fall precautions in patient with multiple chronic conditions.  . Decreased adherence to prescribed treatment for fall prevention . Unable to independently manage falls  . Lacks social connections . Does not contact provider office  for questions/concerns . Knowledge Deficits related to resources to help with making home safe  . Chronic Disease Management support and education needs related to falls prevention and safety  . Lacks caregiver support.  Clinical Goal(s):  . patient will demonstrate improved adherence to prescribed treatment plan for decreasing falls as evidenced by patient reporting and review of EMR . patient will verbalize using fall risk reduction strategies  discussed . patient will not experience additional falls . patient will verbalize understanding of plan for remaining safe in home environment and fall prevention plan. Interventions:  . Collaboration with Malfi, Lupita Raider, FNP regarding development and update of comprehensive plan of care as evidenced by provider attestation and co-signature . Inter-disciplinary care team collaboration (see longitudinal plan of care) . Provided written and verbal education re: Potential causes of falls and Fall prevention strategies . Reviewed medications and discussed potential side effects of medications such as dizziness and frequent urination . Assessed for s/s of orthostatic hypotension. Review and education today  . Assessed for falls since last encounter. Patient had a fall last week, today is 04-06-2020 . Assessed patients knowledge of fall risk prevention secondary to previously provided education. . Assessed working status of life alert bracelet and patient adherence . Provided patient information for fall alert systems . Evaluation of current treatment plan related to falls prevention and safety  and patient's adherence to plan as established by provider. . Advised patient to call the office for new falls or injuries  . Provided education to patient re: changing position slowly, reporting increased light headedness or dizziness, and being safe in home environment  . Discussed plans with patient for ongoing care management follow up and provided patient with direct contact information for care management team Self-Care Deficits:  Unable to independently prevent falls in her home environment  Lacks social connections Does not maintain contact with provider office Does not contact provider office for questions/concerns Patient Goals:  [place] safety precautions appropriately with all ambulation. Does not use a cane or a walker  - De-clutter walkways - Change positions slowly - Wear secure fitting  shoes at all times with ambulation - Utilize home lighting for dim lit areas - Demonstrate self and pet awareness at all times - activities of daily living skills assessed - barriers to physical activity or exercise addressed - barriers to physical activity or exercise identified - barriers to safety identified - cognition assessed - cognitive-stimulating activities promoted - fall prevention plan reviewed and updated - fear of falling, loss of independence and pain acknowledged - medication list reviewed - modification of home and work environment promoted - vision and/or hearing aid use promoted Follow Up Plan: Telephone follow up appointment with care management team member scheduled for: 06-08-2020 at 230 pm   Task: RNCM: Identify and Manage Contributors to Fall Risk   Note:   Care Management Activities:    - activities of daily living skills assessed - barriers to physical activity or exercise addressed - barriers to physical activity or exercise identified - barriers to safety identified - cognition assessed - cognitive-stimulating activities promoted - fall prevention plan reviewed and updated - fear of falling, loss of independence and pain acknowledged - medication list reviewed - modification of home and work environment promoted - vision and/or hearing aid use promoted         The patient verbalized understanding of instructions, educational materials, and care plan provided today and declined offer to receive copy of patient instructions, educational materials, and  care plan.   Telephone follow up appointment with care management team member scheduled for: 06-08-2020 at 230 pm  [name] RN, MSN, [place] Mobile: [phone]

## 2020-04-10 ENCOUNTER — Other Ambulatory Visit: Payer: Self-pay | Admitting: Family Medicine

## 2020-04-10 ENCOUNTER — Ambulatory Visit: Payer: Self-pay | Admitting: Pharmacist

## 2020-04-10 DIAGNOSIS — J449 Chronic obstructive pulmonary disease, unspecified: Secondary | ICD-10-CM

## 2020-04-10 DIAGNOSIS — J3089 Other allergic rhinitis: Secondary | ICD-10-CM

## 2020-04-10 DIAGNOSIS — J302 Other seasonal allergic rhinitis: Secondary | ICD-10-CM

## 2020-04-10 DIAGNOSIS — I10 Essential (primary) hypertension: Secondary | ICD-10-CM

## 2020-04-10 MED ORDER — MONTELUKAST SODIUM 10 MG PO TABS: 10.0000 mg | ORAL_TABLET | Freq: Every day | ORAL | 1 refills | Status: DC

## 2020-04-10 NOTE — Patient Instructions (Signed)
Visit Information  PATIENT GOALS: Goals Addressed            This Visit's Progress   . Pharmacy Goals       Our goal bad cholesterol, or LDL, is less than 70. This is why it is important to continue taking your atorvastatin  Please check your home blood pressure, keep a log of the results and bring this with you to your medical appointments.  Feel free to call me with any questions or concerns. I look forward to our next call!  [name], PharmD, [place] [phone]       The patient verbalized understanding of instructions, educational materials, and care plan provided today and declined offer to receive copy of patient instructions, educational materials, and care plan.   Telephone follow up appointment with care management team member scheduled for: 05/10/2020 at 12:30 PM  [name], PharmD, [place] [phone]

## 2020-04-10 NOTE — Chronic Care Management (AMB) (Signed)
Chronic Care Management Pharmacy Note  04/10/2020 Name:  Beth [name] MRN:  [id number] DOB:  10-Apr-1945  Subjective: Beth [name] is an 75 y.o. year old female who is a primary patient of Malfi, Lupita Raider, FNP.  The CCM team was consulted for assistance with disease management and care coordination needs.    Engaged with patient by telephone for follow up visit in response to provider referral for pharmacy case management and/or care coordination services.   Consent to Services:  The patient was given information about Chronic Care Management services, agreed to services, and gave verbal consent prior to initiation of services.  Please see initial visit note for detailed documentation.   Patient Care Team: Malfi, Lupita Raider, FNP as PCP - General (Family Medicine) Kate Sable, MD as PCP - Cardiology (Cardiology) [name], MD as Consulting Physician (Pain Medicine) Randel Pigg, MD as Referring Physician (Psychiatry) Zabdiel Dripps, Virl Diamond, [facility] (Pharmacist) Vanita Ingles, RN as Case Manager (General Practice) Greg Cutter, LCSW as Social Worker (Licensed Clinical Social Worker)  Recent office visits: None   Recent consult visits: -Telemedicine Visit on 1/6 with [facility] for post-procedure assessment -Urgent Care Visit on 2/28 for Facial swelling/rash. Patient prescribed course of prednisone -Urgent Care Visit on 3/7 for mouth sores/white patches in her mouth. Patient prescribed 7-day course of [facility] visits: None in previous 6 months  Objective:  Lab Results  Component Value Date   CREATININE 1.00 (H) 01/14/2020   CREATININE 1.21 (H) 08/17/2019   CREATININE 0.95 (H) 03/23/2019    Lab Results  Component Value Date   HGBA1C 5.3 06/26/2017       Component Value Date/Time   CHOL 135 04/10/2018 1115   CHOL 118 03/23/2015 1536   CHOL 185 12/25/2012 1449   TRIG 50 04/10/2018 1115   TRIG 93  12/25/2012 1449   HDL 59 04/10/2018 1115   HDL 44 03/23/2015 1536   HDL 39 (L) 12/25/2012 1449   CHOLHDL 2.3 04/10/2018 1115   VLDL 18 03/21/2016 0936   VLDL 19 12/25/2012 1449   LDLCALC 64 04/10/2018 1115   LDLCALC 127 (H) 12/25/2012 1449    Hepatic Function Latest Ref Rng & Units 01/14/2020 08/17/2019 03/23/2019  Total Protein 6.1 - 8.1 g/dL 6.3 7.3 6.4  Albumin 3.5 - 5.0 g/dL - 3.9 -  AST 10 - 35 U/L '23 26 15  ' ALT 6 - 29 U/L '27 25 11  ' Alk Phosphatase 38 - 126 U/L - 98 -  Total Bilirubin 0.2 - 1.2 mg/dL 0.6 0.8 0.5  Bilirubin, Direct 0.1 - 0.5 mg/dL - - -    Lab Results  Component Value Date/Time   TSH 1.74 09/08/2017 02:30 PM   TSH 3.04 03/21/2016 09:36 AM    CBC Latest Ref Rng & Units 01/14/2020 08/17/2019 03/23/2019  WBC 3.8 - 10.8 Thousand/uL 9.0 9.9 7.3  Hemoglobin 11.7 - 15.5 g/dL 12.5 12.5 11.3(L)  Hematocrit 35.0 - 45.0 % 38.2 39.5 35.1  Platelets 140 - 400 Thousand/uL 274 199 223    Lab Results  Component Value Date/Time   VD25OH 37 03/21/2016 09:36 AM   VD25OH 35.3 03/23/2015 03:36 PM    Social History   Tobacco Use  Smoking Status Current Every Day Smoker  . Packs/day: 0.25  . Years: 57.00  . Pack years: 14.25  . Types: Cigarettes  Smokeless Tobacco Never Used  Tobacco Comment   .5ppd currently   BP Readings  from Last 3 Encounters:  04/03/20 95/75  03/27/20 128/61  01/14/20 (!) 115/55   Pulse Readings from Last 3 Encounters:  04/03/20 81  03/27/20 90  01/14/20 74   Wt Readings from Last 3 Encounters:  04/03/20 158 lb (71.7 kg)  03/27/20 160 lb 7.9 oz (72.8 kg)  01/14/20 160 lb 6.4 oz (72.8 kg)    Assessment: Review of patient past medical history, allergies, medications, health status, including review of consultants reports, laboratory and other test data, was performed as part of comprehensive evaluation and provision of chronic care management services.   SDOH:  (Social Determinants of Health) assessments and interventions performed:  none   CCM Care Plan  Allergies  Allergen Reactions  . Bextra  [Valdecoxib]   . Compazine [Prochlorperazine Edisylate]     Stroke-like symptoms  . Lithium Carbonate     Leg weakness  . Lyrica [Pregabalin]     Medications Reviewed Today    Reviewed by Vanita Ingles on 04/06/20 at 1400  Med List Status: <None>  Medication Order Taking? Sig Documenting Provider Last Dose Status Informant  acetaminophen (TYLENOL) 500 MG tablet [id number] No Take 500 mg by mouth 2 (two) times daily as needed.  [provider] 03/27/2020 Unknown time Active Pharmacy Records  albuterol (PROVENTIL) (2.5 MG/3ML) 0.083% nebulizer solution 2.5 mg [id number]   Fredderick Severance, NP  Active   aspirin 81 MG chewable tablet [id number] No Chew 81 mg daily by mouth. [provider] 03/27/2020 Unknown time Active Pharmacy Records           Med Note Dell Ponto Dec 18, 2016  4:14 PM)    atorvastatin (LIPITOR) 40 MG tablet [id number] No TAKE 1 TABLET BY MOUTH EVERY DAY Verl Bangs, FNP 03/27/2020 Unknown time Active   azelastine (ASTELIN) 0.1 % nasal spray [id number] No Place 2 sprays into both nostrils 2 (two) times daily. Mikey College, NP 03/27/2020 Unknown time Active   clonazePAM (KLONOPIN) 0.5 MG tablet [id number] No TK 1 T PO DAILY PRN [provider] 03/27/2020 Unknown time Active   COMBIVENT RESPIMAT 20-100 MCG/ACT AERS respimat [id number] No INHALE 1 PUFF BY MOUTH EVERY 6 HOURS [name], DO 03/27/2020 Unknown time Active   FLUoxetine (PROZAC) 20 MG tablet [id number] No Take 60 mg by mouth daily.  [provider] 03/27/2020 Unknown time Active   Fluticasone-Umeclidin-Vilant (TRELEGY ELLIPTA) 200-62.5-25 MCG/INH AEPB [id number] No Inhale 1 puff into the lungs daily. Parrett, Fonnie Mu, NP 03/27/2020 Unknown time Active   furosemide (LASIX) 20 MG tablet [id number] No Take 1 tablet (20 mg total) by mouth daily. Kate Sable, MD Taking Expired 11/15/19  2359   gabapentin (NEURONTIN) 300 MG capsule [id number] No Take 2 capsules (600 mg total) by mouth 3 (three) times daily. [name], MD 03/27/2020 Unknown time Active            Med Note [facility], Kaliah Haddaway A   Wed Feb 02, 2020  5:33 PM) Reports taking 2 capsules (600 mg) twice daily  hydrOXYzine (ATARAX/VISTARIL) 25 MG tablet [id number] No Take 25-50 mg by mouth daily as needed.  Patient not taking: No sig reported   [provider] Not Taking Active   magic mouthwash (nystatin, lidocaine, diphenhydrAMINE, alum & mag hydroxide) suspension [id number]  Swish and swallow 10 mLs 3 (three) times daily for 7 days. Chase Picket, MD  Active   Melatonin 3 MG CAPS [id number] No Take 10 mg by mouth at bedtime as needed (  sleep).  [provider] 03/27/2020 Unknown time Active Self  metoprolol succinate (TOPROL-XL) 25 MG 24 hr tablet [id number] No TAKE 1/2 TABLET(12.5 MG) BY MOUTH DAILY Verl Bangs, FNP 03/27/2020 Unknown time Active   montelukast (SINGULAIR) 10 MG tablet [id number] No TAKE 1 TABLET(10 MG) BY MOUTH AT BEDTIME Verl Bangs, FNP 03/27/2020 Unknown time Active   SEROQUEL 100 MG tablet [id number] No Take 100 mg by mouth at bedtime. [provider] 03/27/2020 Unknown time Active   triamcinolone ointment (KENALOG) 0.5 % [id number] No Apply 1 application topically 2 (two) times daily. Verl Bangs, FNP 03/27/2020 Unknown time Active   Med List Note Rise Patience, RN 02/12/18 1055): Medication agreement signed 02/12/2018 UDS 02/24/17 03/25/17 PA requested for gabapentin sent SB           Patient Active Problem List   Diagnosis Date Noted  . Rash 01/14/2020  . Pulmonary hypertension ([place]) 09/14/2019  . Coccydynia 07/29/2019  . Cervicalgia 05/04/2019  . Acute pain of left shoulder 05/04/2019  . Elbow pain, left 05/04/2019  . DOE (dyspnea on exertion) 03/23/2019  . Leg swelling 03/22/2019    Class: Acute  . Lumbar degenerative disc disease 09/10/2018  . Left  hip pain 09/10/2018  . Post-traumatic osteoarthritis of left elbow 07/14/2018  . Lumbar radiculopathy 02/12/2018  . Lumbar spondylosis 02/12/2018  . Lumbar facet arthropathy 02/12/2018  . Atherosclerosis of aorta ([place]) 09/17/2017  . Lung nodule 09/17/2017  . Urinary tract infection 12/05/2016  . Protein-calorie malnutrition, mild ([place]) 08/07/2016  . Generalized anxiety disorder 07/15/2016  . Chronic pain syndrome 07/15/2016  . Severe episode of recurrent major depressive disorder, without psychotic features ([place]) 07/14/2016  . Moderate benzodiazepine use disorder ([place]) 05/08/2016  . Major depressive disorder, recurrent, severe w/o psychotic behavior ([place]) 05/01/2016  . Seasonal allergic rhinitis 03/23/2015  . Hyperglycemia 03/23/2015  . Senile purpura ([place]) 03/23/2015  . Perennial allergic rhinitis with seasonal variation 03/23/2015  . Marital problems 10/31/2014  . Migraine without aura and without status migrainosus, not intractable 10/31/2014  . Chronic bilateral low back pain without sciatica 08/09/2014  . Colon polyp 08/09/2014  . COPD, severe ([place]) 08/09/2014  . CVA, old, hemiparesis ([place]) 08/09/2014  . Dyslipidemia 08/09/2014  . Dysfunction of eustachian tube 08/09/2014  . Fibromyalgia syndrome 08/09/2014  . Gastro-esophageal reflux disease without esophagitis 08/09/2014  . Benign migrating glossitis 08/09/2014  . Cerebrovascular accident, old 08/09/2014  . IBS (irritable bowel syndrome) 08/09/2014  . Low back pain with radiation 08/09/2014  . Chronic recurrent major depressive disorder ([place]) 08/09/2014  . Dysmetabolic syndrome 96/75/9163  . OP (osteoporosis) 08/09/2014  . Vitamin D deficiency 08/09/2014  . Smoking 08/09/2014  . Benign hypertension 07/19/2013  . Benign neoplasm of skin of trunk 06/03/2013  . H/O: pneumonia 09/25/2012    Immunization History  Administered Date(s) Administered  . Influenza Split 11/13/2006, 11/02/2008  . Influenza, High Dose Seasonal  PF 09/27/2014, 09/29/2015, 12/30/2016, 10/15/2018, 11/05/2019  . Influenza, Seasonal, Injecte, Preservative Fre 11/12/2010  . Influenza,inj,Quad PF,6+ Mos 10/08/2012  . Influenza-Unspecified 04/08/2014  . PFIZER(Purple Top)SARS-COV-2 Vaccination 03/14/2019, 04/09/2019  . PPD Test 05/16/2016  . Pneumococcal Conjugate-13 09/27/2014  . Pneumococcal Polysaccharide-23 06/12/2010  . Tdap 03/19/2007, 09/29/2015  . Zoster 04/16/2012    Conditions to be addressed/monitored: HTN, HLD and COPD  Care Plan : PharmD - Med Mgmt  Updates made by Vella Raring, [place] since 04/10/2020 12:00 AM    Problem: Disease Progression     Long-Range Goal: Disease Progression Prevented  or Minimized   Start Date: 04/10/2020  Expected End Date: 07/09/2020  This Visit's Progress: On track  Priority: High  Note:   Current Barriers:  . Chronic Disease Management support, education, and care coordination needs related to previous CVA, COPD, hypertension, hyperlipidemia, depression  Pharmacist Clinical Goal(s):  Marland Kitchen Over the next 90 days, patient will achieve adherence to monitoring guidelines and medication adherence to achieve therapeutic efficacy through collaboration with PharmD and provider.   Interventions: . 1:1 collaboration with [name], DO regarding development and update of comprehensive plan of care as evidenced by provider attestation and co-signature . Inter-disciplinary care team collaboration (see longitudinal plan of care) . Perform chart review o Patient [facility] on 2/28 for facial swelling rash and prescribed course of prednisone o Patient seen at [facility] on 3/6 for mouth sores/white patches in her mouth. Patient prescribed 7 day course of Magic Mouthwash. - Today patient reports that she completed the 7 day course of Magic Mouthwash as directed. Reports mouth sores improved and white patch in mouth resolved, but mouth remains a little  sore. . Patient denies need to follow up with PCP at this time . Counsel patient to call office if soreness does not continue to improve or new/worsening symptoms  Moderate to Severe COPD:  . Patient followed by [facility] in [place] . Current treatment: o Trelegy 1 puff daily , rinse after use - Patient confirms rinsing after use. Reiterate importance of rinsing with water and spitting out (do not swallow) after each use to reduce risk of oral candidiasis o Combivent as needed . PFT completed on 09/09/2019 . Patient referred for Lung cancer screening CT, scheduled for 3/18 . Have counseled on benefits of smoking cessation  Medication Adherence: . Reports using weekly pillbox as adherence aid . Identifies patient in need of refill of montelukast ? Will collaborate with Dr. Parks Ranger  Coordination of Care:  . Collaborated with CCM team and PCP ? CCM RNCM reported patient in need of new psychiatrist . Today patient reports initial appointment with psychiatrist in [place] on 1/24 was canceled as provider not in-network with new insurance (patient switched to [employer] Dual Complete plan) . Reports currently working with a therapist and has an appointment with therapist today at 2 pm to discuss helping her find a new psychiatrist . Reports will contact [place] prior to appointment with therapist for list of in-network psychiatrists in the area . Note CM Social Worker scheduled to reach out to patient tomorrow.  Hypertension . Reports taking: ? Furosemide 20 mg daily ? Metoprolol ER 25 mg - 1/2 tablet (12.5 mg ) daily . Note patient has home upper BP monitor . Reports last check BP last week, reading: 116/69 . Encourage patient to monitor, keep log of BP and bring record to medical appointments   Patient Goals/Self-Care Activities . Over the next 90 days, patient will:  . To self administers medications as prescribed o Encourage patient to obtain weekly  pillbox to use as adherence tool . Attends all scheduled provider appointments . Calls pharmacy for medication refills . Calls provider office for new concerns or questions  Follow Up Plan: Telephone follow up appointment with care management team member scheduled for:       Medication Assistance: None required.  Patient affirms current coverage meets needs.  Patient's preferred pharmacy is:  [id number] - [place], [place] - [address] AT [facility] [address] [place] [address] Phone: [phone] Fax: [phone]  Uses pill box? Yes  Follow Up:  Patient agrees to Care Plan and Follow-up.  Plan: Telephone follow up appointment with care management team member scheduled for:  05/10/2020 at 12:30 PM  [name], PharmD, [place] [phone]

## 2020-04-11 ENCOUNTER — Ambulatory Visit: Payer: Medicare HMO | Admitting: Licensed Clinical Social Worker

## 2020-04-11 DIAGNOSIS — I10 Essential (primary) hypertension: Secondary | ICD-10-CM

## 2020-04-11 DIAGNOSIS — F332 Major depressive disorder, recurrent severe without psychotic features: Secondary | ICD-10-CM

## 2020-04-11 DIAGNOSIS — J449 Chronic obstructive pulmonary disease, unspecified: Secondary | ICD-10-CM

## 2020-04-11 DIAGNOSIS — F411 Generalized anxiety disorder: Secondary | ICD-10-CM

## 2020-04-11 NOTE — Chronic Care Management (AMB) (Signed)
Chronic Care Management    Clinical Social Work Note  04/11/2020 Name: NOEL HENANDEZ MRN: [id number] DOB: [birth year]  Cresenciano Lick Andringa is a 75 y.o. year old female who is a primary care patient of Lorine Bears, Lupita Raider, FNP. The CCM team was consulted to assist the patient with chronic disease management and/or care coordination needs related to: Mental Health Counseling and Resources.   Engaged with patient by telephone for follow up visit in response to provider referral for social work chronic care management and care coordination services.   Consent to Services:  The patient was given the following information about Chronic Care Management services today, agreed to services, and gave verbal consent: 1. CCM service includes personalized support from designated clinical staff supervised by the primary care provider, including individualized plan of care and coordination with other care providers 2. 24/7 contact phone numbers for assistance for urgent and routine care needs. 3. Service will only be billed when office clinical staff spend 20 minutes or more in a month to coordinate care. 4. Only one practitioner may furnish and bill the service in a calendar month. 5.The patient may stop CCM services at any time (effective at the end of the month) by phone call to the office staff. 6. The patient will be responsible for cost sharing (co-pay) of up to 20% of the service fee (after annual deductible is met). Patient agreed to services and consent obtained.  Patient agreed to services and consent obtained.   Assessment: Review of patient past medical history, allergies, medications, and health status, including review of relevant consultants reports was performed today as part of a comprehensive evaluation and provision of chronic care management and care coordination services.     SDOH (Social Determinants of Health) assessments and interventions performed:    Advanced Directives Status: See Care Plan  for related entries.  CCM Care Plan  Allergies  Allergen Reactions  . Bextra  [Valdecoxib]   . Compazine [Prochlorperazine Edisylate]     Stroke-like symptoms  . Lithium Carbonate     Leg weakness  . Lyrica [Pregabalin]     Outpatient Encounter Medications as of 04/11/2020  Medication Sig Note  . acetaminophen (TYLENOL) 500 MG tablet Take 500 mg by mouth 2 (two) times daily as needed.    Marland Kitchen aspirin 81 MG chewable tablet Chew 81 mg daily by mouth.   Marland Kitchen atorvastatin (LIPITOR) 40 MG tablet TAKE 1 TABLET BY MOUTH EVERY DAY   . azelastine (ASTELIN) 0.1 % nasal spray Place 2 sprays into both nostrils 2 (two) times daily.   . clonazePAM (KLONOPIN) 0.5 MG tablet TK 1 T PO DAILY PRN   . COMBIVENT RESPIMAT 20-100 MCG/ACT AERS respimat INHALE 1 PUFF BY MOUTH EVERY 6 HOURS   . FLUoxetine (PROZAC) 20 MG tablet Take 60 mg by mouth daily.    . Fluticasone-Umeclidin-Vilant (TRELEGY ELLIPTA) 200-62.5-25 MCG/INH AEPB Inhale 1 puff into the lungs daily.   . furosemide (LASIX) 20 MG tablet Take 1 tablet (20 mg total) by mouth daily.   Marland Kitchen gabapentin (NEURONTIN) 300 MG capsule Take 2 capsules (600 mg total) by mouth 3 (three) times daily. 02/02/2020: Reports taking 2 capsules (600 mg) twice daily  . hydrOXYzine (ATARAX/VISTARIL) 25 MG tablet Take 25-50 mg by mouth daily as needed. (Patient not taking: No sig reported)   . Melatonin 3 MG CAPS Take 10 mg by mouth at bedtime as needed (sleep).    . metoprolol succinate (TOPROL-XL) 25 MG 24 hr tablet TAKE 1/2  TABLET(12.5 MG) BY MOUTH DAILY   . montelukast (SINGULAIR) 10 MG tablet Take 1 tablet (10 mg total) by mouth at bedtime.   . SEROQUEL 100 MG tablet Take 100 mg by mouth at bedtime.   . triamcinolone ointment (KENALOG) 0.5 % Apply 1 application topically 2 (two) times daily.    Facility-Administered Encounter Medications as of 04/11/2020  Medication  . albuterol (PROVENTIL) (2.5 MG/3ML) 0.083% nebulizer solution 2.5 mg    Patient Active Problem List    Diagnosis Date Noted  . Rash 01/14/2020  . Pulmonary hypertension ([place]) 09/14/2019  . Coccydynia 07/29/2019  . Cervicalgia 05/04/2019  . Acute pain of left shoulder 05/04/2019  . Elbow pain, left 05/04/2019  . DOE (dyspnea on exertion) 03/23/2019  . Leg swelling 03/22/2019    Class: Acute  . Lumbar degenerative disc disease 09/10/2018  . Left hip pain 09/10/2018  . Post-traumatic osteoarthritis of left elbow 07/14/2018  . Lumbar radiculopathy 02/12/2018  . Lumbar spondylosis 02/12/2018  . Lumbar facet arthropathy 02/12/2018  . Atherosclerosis of aorta ([place]) 09/17/2017  . Lung nodule 09/17/2017  . Urinary tract infection 12/05/2016  . Protein-calorie malnutrition, mild ([place]) 08/07/2016  . Generalized anxiety disorder 07/15/2016  . Chronic pain syndrome 07/15/2016  . Severe episode of recurrent major depressive disorder, without psychotic features ([place]) 07/14/2016  . Moderate benzodiazepine use disorder ([place]) 05/08/2016  . Major depressive disorder, recurrent, severe w/o psychotic behavior ([place]) 05/01/2016  . Seasonal allergic rhinitis 03/23/2015  . Hyperglycemia 03/23/2015  . Senile purpura ([place]) 03/23/2015  . Perennial allergic rhinitis with seasonal variation 03/23/2015  . Marital problems 10/31/2014  . Migraine without aura and without status migrainosus, not intractable 10/31/2014  . Chronic bilateral low back pain without sciatica 08/09/2014  . Colon polyp 08/09/2014  . COPD, severe ([place]) 08/09/2014  . CVA, old, hemiparesis ([place]) 08/09/2014  . Dyslipidemia 08/09/2014  . Dysfunction of eustachian tube 08/09/2014  . Fibromyalgia syndrome 08/09/2014  . Gastro-esophageal reflux disease without esophagitis 08/09/2014  . Benign migrating glossitis 08/09/2014  . Cerebrovascular accident, old 08/09/2014  . IBS (irritable bowel syndrome) 08/09/2014  . Low back pain with radiation 08/09/2014  . Chronic recurrent major depressive disorder ([place]) 08/09/2014  . Dysmetabolic syndrome  33/00/7622  . OP (osteoporosis) 08/09/2014  . Vitamin D deficiency 08/09/2014  . Smoking 08/09/2014  . Benign hypertension 07/19/2013  . Benign neoplasm of skin of trunk 06/03/2013  . H/O: pneumonia 09/25/2012    Conditions to be addressed/monitored: Anxiety and Depression; Mental Health Concerns   Care Plan : General Social Work (Adult)  Updates made by Greg Cutter, LCSW since 04/11/2020 12:00 AM    Problem: Response to Treatment (Depression)     Long-Range Goal: Response to Treatment Maximized   Start Date: 02/17/2020  Recent Progress: On track  Priority: Medium  Note:     Timeframe:  Long-Range Goal Priority:  Medium Start Date:  02/17/20                           Expected End Date:  05/17/20                    Follow Up Date-60 days from 04/11/20   - avoid negative self-talk - develop a personal safety plan - develop a plan to deal with triggers like holidays, anniversaries - exercise at least 2 to 3 times per week - have a plan for how to handle bad days - journal feelings and  what helps to feel better or worse - spend time or talk with others at least 2 to 3 times per week - spend time or talk with others every day - watch for early signs of feeling worse - write in journal every day    Why is this important?    Keeping track of your progress will help your treatment team find the right mix of medicine and therapy for you.   Write in your journal every day.   Day-to-day changes in depression symptoms are normal. It may be more helpful to check your progress at the end of each week instead of every day.     Current Barriers:  . Chronic Mental Health needs related to depression . Financial constraints related to managing health care expenses . Limited social support . ADL IADL limitations . Limited access to caregiver . Inability to perform ADL's independently . Suicidal Ideation/Homicidal Ideation: No  Clinical Social Work Goal(s):  Marland Kitchen Over the next 120  days, patient will work with SW bi-monthly by telephone or in person to reduce or manage symptoms related to depression . Over the next 120 days, patient will work with SW to address concerns related to decreasing depression and increasing self-care  Interventions: . Patient interviewed and appropriate assessments performed: brief mental health assessment . CCM LCSW spoke to son on 03/21/20 and spoke with patient today on 04/11/20. Provided mental health counseling with regard to depression and anxiety. Patient reports managing her mental health well at this time. Patient shares that her anxiety is worse than her depression at this time. Patient reports getting outside and getting fresh air helps with anxiety. LCSW educated patient on coping methods to implement into her daily life to combat depressive symptoms and stress. Patient denied any suicidal or homicidal ideations. Encouraged patient to implement deep breathing exercises into her daily routine due to ongoing stress. Patient reports that she is actively working on both her physical and mental health. She shares that her legs continue to swell even though she is now on Lasix. Patient reports that she is using her compression stockings during the day and elevating her legs at night as needed. Patient is still hopeful for some relief from the swelling.  . Patient reports that she only experiences SOB when she walks for a long period of time . Patient reports that her psychiatrist in [place] wanted her to discontinue services and find a mental health provider that is closer to her location. Her psychiatrist referred her to a psychiatrist in [place], [place]. Patient is unable to state the name of this agency but reports that her initial appointment is on 02/21/20 and is telephonic. Patient reports that they had to reschedule this appointment for tomorrow at 11:15. Patient will complete new patient appointment with psychiatrist tomorrow. She confirms that this is  a telephonic appointment. Patient will contact CCM LCSW if she has any issues with gaining new mental health services.  UPDATE- Patient reports that she still needs a psychiatrist as her telephonic appointment with with a counselor named Jenny Reichmann who has his own Biomedical engineer. Patient is agreeable to [facility] referral in order to gain psychiatry support. CCM LCSW will place referral today on 04/11/20.  Marland Kitchen Discussed plans with patient for ongoing care management follow up and provided patient with direct contact information for care management team . Provided education and assistance to client regarding Advanced Directives. . A voluntary and extensive discussion about advanced care planning including explanation and discussion of advanced  was undertaken with the patient. Explanation regarding healthcare proxy and living will was reviewed and packet with forms with explanation of how to fill them out was given.   . Provided education to patient/caregiver about Hospice and/or Palliative Care services . Patient reports that her rash is gone that she was concerned about in December of 2021. Marland Kitchen Patient reports that she lives alone and only drives when she needs to. She reports that her son that lives in [place] and calls and visits her weekly to check on her needs. He also provides stable transportation for all of her appointments.  . Mindfulness or Relaxation Training provided during session for patient to implement when faced with future triggers . Patient reports implementing appropriate self-care by getting up each day and walking outside to get some fresh air. LCSW provided positive reinforcement for this accomplishment. . Patient denies any issues with her sleep routine. She reports that she takes a melatonin vitamin every night which helps her to unwind from the day.  . Son reports that patient is doing well and has improved her socialization by going out to eat and running errands with her neighbor friends that  live in her same complex. Patient reports that she likes living alone and has several friends if she desires socialization or someone to talk to.   Patient Self Care Activities:  . Calls provider office for new concerns or questions . Ability for insight . Motivation for treatment  Patient Coping Strengths:  . Hopefulness . Self Advocate . Able to Communicate Effectively  Patient Self Care Deficits:  . Lacks social connections  Please see past updates related to this goal by clicking on the "Past Updates" button in the selected goal        Follow Up Plan: SW will follow up with patient by phone over the next 60 days      Eula Fried, [employer], MSW, [email or web address] Phone: [phone]

## 2020-04-11 NOTE — Patient Instructions (Signed)
Licensed Clinical Social Worker Visit Information  Goals we discussed today:  Goals Addressed            This Visit's Progress   . SW-Track and Manage My Symptoms-Depression        Timeframe:  Long-Range Goal Priority:  Medium Start Date:  02/17/20                           Expected End Date:  05/17/20                    Follow Up Date-60 days from 04/11/20   - avoid negative self-talk - develop a personal safety plan - develop a plan to deal with triggers like holidays, anniversaries - exercise at least 2 to 3 times per week - have a plan for how to handle bad days - journal feelings and what helps to feel better or worse - spend time or talk with others at least 2 to 3 times per week - spend time or talk with others every day - watch for early signs of feeling worse - write in journal every day    Why is this important?    Keeping track of your progress will help your treatment team find the right mix of medicine and therapy for you.   Write in your journal every day.   Day-to-day changes in depression symptoms are normal. It may be more helpful to check your progress at the end of each week instead of every day.     Current Barriers:  . Chronic Mental Health needs related to depression . Financial constraints related to managing health care expenses . Limited social support . ADL IADL limitations . Limited access to caregiver . Inability to perform ADL's independently . Suicidal Ideation/Homicidal Ideation: No  Clinical Social Work Goal(s):  Marland Kitchen Over the next 120 days, patient will work with SW bi-monthly by telephone or in person to reduce or manage symptoms related to depression . Over the next 120 days, patient will work with SW to address concerns related to decreasing depression and increasing self-care  Interventions: . Patient interviewed and appropriate assessments performed: brief mental health assessment . CCM LCSW spoke to son on 03/21/20 and spoke with  patient today on 04/11/20. Provided mental health counseling with regard to depression and anxiety. Patient reports managing her mental health well at this time. Patient shares that her anxiety is worse than her depression at this time. Patient reports getting outside and getting fresh air helps with anxiety. LCSW educated patient on coping methods to implement into her daily life to combat depressive symptoms and stress. Patient denied any suicidal or homicidal ideations. Encouraged patient to implement deep breathing exercises into her daily routine due to ongoing stress. Patient reports that she is actively working on both her physical and mental health. She shares that her legs continue to swell even though she is now on Lasix. Patient reports that she is using her compression stockings during the day and elevating her legs at night as needed. Patient is still hopeful for some relief from the swelling.  . Patient reports that her psychiatrist in [place] wanted her to discontinue services and find a mental health provider that is closer to her location. Her psychiatrist referred her to a psychiatrist in [place], [place]. Patient is unable to state the name of this agency but reports that her initial appointment is on 02/21/20 and is telephonic. Patient reports  that they had to reschedule this appointment for tomorrow at 11:15. Patient will complete new patient appointment with psychiatrist tomorrow. She confirms that this is a telephonic appointment. Patient will contact CCM LCSW if she has any issues with gaining new mental health services.  UPDATE- Patient reports that she still needs a psychiatrist as her telephonic appointment with with a counselor named Jenny Reichmann who has his own Biomedical engineer. Patient is agreeable to [facility] referral in order to gain psychiatry support. CCM LCSW will place referral today on 04/11/20.  Marland Kitchen Discussed plans with patient for ongoing care management follow up and provided patient with direct  contact information for care management team . Provided education and assistance to client regarding Advanced Directives. . A voluntary and extensive discussion about advanced care planning including explanation and discussion of advanced was undertaken with the patient. Explanation regarding healthcare proxy and living will was reviewed and packet with forms with explanation of how to fill them out was given.   . Provided education to patient/caregiver about Hospice and/or Palliative Care services . Patient reports that her rash is gone that she was concerned about in December of 2021. Marland Kitchen Patient reports that she lives alone and only drives when she needs to. She reports that her son that lives in [place] and calls and visits her weekly to check on her needs. He also provides stable transportation for all of her appointments.  . Mindfulness or Relaxation Training provided during session for patient to implement when faced with future triggers . Patient reports implementing appropriate self-care by getting up each day and walking outside to get some fresh air. LCSW provided positive reinforcement for this accomplishment. . Patient denies any issues with her sleep routine. She reports that she takes a melatonin vitamin every night which helps her to unwind from the day.  . Son reports that patient is doing well and has improved her socialization by going out to eat and running errands with her neighbor friends that live in her same complex. Patient reports that she likes living alone and has several friends if she desires socialization or someone to talk to.   Patient Self Care Activities:  . Calls provider office for new concerns or questions . Ability for insight . Motivation for treatment  Patient Coping Strengths:  . Hopefulness . Self Advocate . Able to Communicate Effectively  Patient Self Care Deficits:  . Lacks social connections  Please see past updates related to this goal by clicking  on the "Past Updates" button in the selected goal

## 2020-04-13 ENCOUNTER — Other Ambulatory Visit: Payer: Self-pay | Admitting: Family Medicine

## 2020-04-13 ENCOUNTER — Ambulatory Visit: Payer: Self-pay | Admitting: *Deleted

## 2020-04-13 DIAGNOSIS — K1379 Other lesions of oral mucosa: Secondary | ICD-10-CM

## 2020-04-13 MED ORDER — MAGIC MOUTHWASH W/LIDOCAINE: ORAL | 0 refills | Status: DC

## 2020-04-13 NOTE — Addendum Note (Signed)
Addended by: [name] on: 04/13/2020 12:02 PM   Modules accepted: Orders

## 2020-04-13 NOTE — Telephone Encounter (Signed)
Pt seen at [facility] 04/03/20 "Mouth sores from taking prednisone." Prescribed nystatin swish and swallow. States has improved, "But some are still there." Reports able to eat and drink "But painful." Last used MMW 2 days ago. Denies any swelling, states tongue red, sore "But a little better."  Pt questioning if another med is appropriate or continue nystatin. Assured pt NT would route to practice for PCPs review.  Please advise: CB# [phone]  Reason for Disposition . Caller wants to use a complementary or alternative medicine  Answer Assessment - Initial Assessment Questions 1. NAME of MEDICATION: "What medicine are you calling about?"     Nystatin mouth wash 2. QUESTION: "What is your question?" (e.g., medication refill, side effect)     "Bumps in mouth remain" 3. PRESCRIBING HCP: "Who prescribed it?" Reason: if prescribed by specialist, call should be referred to that group.     At [facility] 4. SYMPTOMS: "Do you have any symptoms?"    yes 5. SEVERITY: If symptoms are present, ask "Are they mild, moderate or severe?"    Tongue and mouth  Protocols used: MEDICATION QUESTION CALL-A-AH

## 2020-04-13 NOTE — Telephone Encounter (Signed)
The pt state she finish the Nystatin mouthwash. She would like for you to send the [employer] to [facility].

## 2020-04-13 NOTE — Telephone Encounter (Signed)
Okay to continue Nystatin mouthwash if helping.  We could order a [employer] Magic Mouthwash if she is out of mouthwash and needs more.  Nobie Putnam, DO [facility] 04/13/2020, 11:11 AM

## 2020-04-13 NOTE — Telephone Encounter (Signed)
Attempted to contact the patient, no answer. I left a detail message on the patient personal vm.

## 2020-04-13 NOTE — Telephone Encounter (Signed)
Medication: magic mouthwash w/lidocaine SOLN [[id number]]   Patient states that the pharmacy did not receive this script. Can this be resent.   Has the patient contacted their pharmacy? YES (Agent: If no, request that the patient contact the pharmacy for the refill.) (Agent: If yes, when and what did the pharmacy advise?)  Preferred Pharmacy (with phone number or street name): [facility] [place], [place] - [address]  [place], [place] [address]  Phone:  [phone] Fax:  [phone]   Agent: Please be advised that RX refills may take up to 3 business days. We ask that you follow-up with your pharmacy.

## 2020-04-14 ENCOUNTER — Ambulatory Visit: Admission: RE | Admit: 2020-04-14 | Payer: Medicare Other | Source: Ambulatory Visit

## 2020-04-14 ENCOUNTER — Other Ambulatory Visit: Payer: Self-pay | Admitting: *Deleted

## 2020-04-14 DIAGNOSIS — R918 Other nonspecific abnormal finding of lung field: Secondary | ICD-10-CM

## 2020-04-14 DIAGNOSIS — Z87891 Personal history of nicotine dependence: Secondary | ICD-10-CM

## 2020-04-14 NOTE — Chronic Care Management (AMB) (Signed)
  Care Management   Note  04/14/2020 Name: Beth [name] MRN: [id number] DOB: [birth year]  Beth [name] is a 75 y.o. year old female who is a primary care patient of [place], Lupita Raider, FNP and is actively engaged with the care management team. I reached out to [name] by phone today to assist with re-scheduling a follow up visit with the Pharmacist  Follow up plan: Telephone appointment with care management team member scheduled for:05/10/2020  [name], [place], [place], [place] [address] Direct Dial: [phone] [email or web address] Website: [email or web address]

## 2020-04-19 ENCOUNTER — Ambulatory Visit: Payer: Medicare HMO | Admitting: Family Medicine

## 2020-04-19 ENCOUNTER — Encounter: Payer: Self-pay | Admitting: Family Medicine

## 2020-04-19 ENCOUNTER — Other Ambulatory Visit: Payer: Self-pay

## 2020-04-19 ENCOUNTER — Ambulatory Visit (INDEPENDENT_AMBULATORY_CARE_PROVIDER_SITE_OTHER): Payer: Medicaid Other | Admitting: Family Medicine

## 2020-04-19 VITALS — BP 123/59 | HR 78 | Ht 65.0 in | Wt 158.8 lb

## 2020-04-19 DIAGNOSIS — F411 Generalized anxiety disorder: Secondary | ICD-10-CM

## 2020-04-19 DIAGNOSIS — F332 Major depressive disorder, recurrent severe without psychotic features: Secondary | ICD-10-CM

## 2020-04-19 DIAGNOSIS — F5104 Psychophysiologic insomnia: Secondary | ICD-10-CM

## 2020-04-19 MED ORDER — SEROQUEL 100 MG PO TABS: 100.0000 mg | ORAL_TABLET | Freq: Every day | ORAL | 1 refills | Status: DC

## 2020-04-19 MED ORDER — CLONAZEPAM 0.5 MG PO TABS: ORAL_TABLET | ORAL | 1 refills | Status: DC

## 2020-04-19 MED ORDER — FLUOXETINE HCL 20 MG PO TABS
60.0000 mg | ORAL_TABLET | Freq: Every day | ORAL | 1 refills | Status: DC
Start: 2020-04-19 — End: 2020-05-25

## 2020-04-19 MED ORDER — DULOXETINE HCL 20 MG PO CPEP: 20.0000 mg | ORAL_CAPSULE | Freq: Every day | ORAL | 1 refills | Status: DC

## 2020-04-19 NOTE — Patient Instructions (Addendum)
Thank you for coming to the office today.  Try OTC Peroxyl debridement mouthwash - swish and spit to help the sore heal.  Refilled all 4 meds for 1 month + 1 refill until you can get into Psych  Please schedule a Follow-up Appointment to: Return if symptoms worsen or fail to improve.  If you have any other questions or concerns, please feel free to call the office or send a message through [facility]. You may also schedule an earlier appointment if necessary.  Additionally, you may be receiving a survey about your experience at our office within a few days to 1 week by e-mail or mail. We value your feedback.  Nobie Putnam, DO [place]

## 2020-04-19 NOTE — Progress Notes (Signed)
Subjective:    Patient ID: Beth [name], female    DOB: Jul 10, 1945, 75 y.o.   MRN: [id number]  FALICIA LIZOTTE is a 75 y.o. female presenting on 04/19/2020 for Anxiety  Previous PCP Cyndia Skeeters, FNP   Here today for medication refills  HPI   Mouth Tongue Sores She went to Urgent Care 04/03/20 Had allergic reaction, tongue soreness Completed magic mouthwash, healed but still has slight white appearing ulceration on tip of tongue, not yet healed.  Major Depression recurrent severe Generalized Anxiety Disorder Insomnia Chronic problems. Previously with Psychiatry in [place], and she was referred to Psychiatry in [place] and they could not take her insurance. She has been calling to try to locate new Psychiatry difficulty finding practice that would see her age 50+ for this issue. She has been on these medications for period of time. Clonazepam 0.5mg  every other day PRN, usually takes about 3 per week or less. Seroquel/Quetiapine 100mg  nightly for inosomnia Fluoxetine 20mg  x 3 = 60mg  daily for mood Duloxetine 20mg  daily for mood and pain Needs refills until she can get in with psych She is going to call insurance to locate provider   Depression screen [facility] 2/9 04/19/2020 07/27/2019 07/09/2019  Decreased Interest 1 0 1  Down, Depressed, Hopeless 2 0 1  PHQ - 2 Score 3 0 2  Altered sleeping 2 - 0  Tired, decreased energy 3 - 0  Change in appetite 1 - 0  Feeling bad or failure about yourself  3 - 1  Trouble concentrating 1 - 0  Moving slowly or fidgety/restless 2 - 0  Suicidal thoughts 0 - 0  PHQ-9 Score 15 - 3  Difficult doing work/chores Somewhat difficult - Somewhat difficult  Some recent data might be hidden   GAD 7 : Generalized Anxiety Score 04/19/2020 10/09/2018 07/14/2018 06/26/2017  Nervous, Anxious, on Edge 2 2 3 2   Control/stop worrying 2 2 1 3   Worry too much - different things 1 1 1 2   Trouble relaxing 3 3 2 3   Restless 1 2 1 1   Easily annoyed or irritable 2 1  0 0  Afraid - awful might happen 1 2 0 2  Total GAD 7 Score 12 13 8 13   Anxiety Difficulty Somewhat difficult Somewhat difficult Not difficult at all Very difficult      Social History   Tobacco Use  . Smoking status: Current Every Day Smoker    Packs/day: 0.25    Years: 57.00    Pack years: 14.25    Types: Cigarettes  . Smokeless tobacco: Never Used  . Tobacco comment: .5ppd currently  Vaping Use  . Vaping Use: Never used  Substance Use Topics  . Alcohol use: No    Alcohol/week: 0.0 standard drinks  . Drug use: No    Review of Systems Per HPI unless specifically indicated above     Objective:    BP (!) 123/59   Pulse 78   Ht 5\' 5"  (1.651 m)   Wt 158 lb 12.8 oz (72 kg)   SpO2 98%   BMI 26.43 kg/m   Wt Readings from Last 3 Encounters:  04/19/20 158 lb 12.8 oz (72 kg)  04/03/20 158 lb (71.7 kg)  03/27/20 160 lb 7.9 oz (72.8 kg)    Physical Exam Vitals and nursing note reviewed.  Constitutional:      General: She is not in acute distress.    Appearance: She is well-developed. She is not diaphoretic.  Comments: Well-appearing, comfortable, cooperative  HENT:     Head: Normocephalic and atraumatic.  Eyes:     General:        Right eye: No discharge.        Left eye: No discharge.     Conjunctiva/sclera: Conjunctivae normal.  Cardiovascular:     Rate and Rhythm: Normal rate.  Pulmonary:     Effort: Pulmonary effort is normal.  Skin:    General: Skin is warm and dry.     Findings: No erythema or rash.  Neurological:     Mental Status: She is alert and oriented to person, place, and time.  Psychiatric:        Behavior: Behavior normal.     Comments: Well groomed, good eye contact, normal speech and thoughts    Results for orders placed or performed during the hospital encounter of 03/27/20  Group A Strep by PCR   Specimen: Throat; Sterile Swab  Result Value Ref Range   Group A Strep by PCR NOT DETECTED NOT DETECTED      Assessment & Plan:    Problem List Items Addressed This Visit    Major depressive disorder, recurrent, severe w/o psychotic behavior ([place]) - Primary   Relevant Medications   FLUoxetine (PROZAC) 20 MG tablet   DULoxetine (CYMBALTA) 20 MG capsule   SEROQUEL 100 MG tablet   Generalized anxiety disorder   Relevant Medications   clonazePAM (KLONOPIN) 0.5 MG tablet   FLUoxetine (PROZAC) 20 MG tablet   DULoxetine (CYMBALTA) 20 MG capsule    Other Visit Diagnoses    Psychophysiological insomnia       Relevant Medications   clonazePAM (KLONOPIN) 0.5 MG tablet   SEROQUEL 100 MG tablet      Chronic mental health problems Recurrent depression / GAD / insomnia Mostly controlled on medications now, and awaiting new psychiatry, she is contacting other offices and will contact her insurance for a recommendation - needs to find in network that will take her as patient. Issue with geriatric psych age 32+ I advised her that given she is currently on these meds, my goal is not to stop them all and we would help bridge her until she can be seen by Psychiatry for now, short term plan. She will check with insurance, we may need to refer if needed - my concern is geriatric psych locally is very difficult to find, may be up to 3-6 month wait, may need [facility] in past has seen geri psych but can have long wait  Re ordered her meds for 30 day + 1 refill for now.  Meds ordered this encounter  Medications  . clonazePAM (KLONOPIN) 0.5 MG tablet    Sig: Take one tablet 0.5mg  every other day as needed for anxiety    Dispense:  15 tablet    Refill:  1  . FLUoxetine (PROZAC) 20 MG tablet    Sig: Take 3 tablets (60 mg total) by mouth daily.    Dispense:  90 tablet    Refill:  1  . DULoxetine (CYMBALTA) 20 MG capsule    Sig: Take 1 capsule (20 mg total) by mouth daily.    Dispense:  30 capsule    Refill:  1  . SEROQUEL 100 MG tablet    Sig: Take 1 tablet (100 mg total) by mouth at bedtime.    Dispense:  30 tablet     Refill:  1      Follow up plan: Return if symptoms  worsen or fail to improve.    Nobie Putnam, [facility] 04/19/2020, 3:01 PM

## 2020-04-24 ENCOUNTER — Ambulatory Visit: Admission: RE | Admit: 2020-04-24 | Payer: Medicare Other | Source: Ambulatory Visit

## 2020-04-25 ENCOUNTER — Ambulatory Visit: Payer: Self-pay | Admitting: Licensed Clinical Social Worker

## 2020-04-25 DIAGNOSIS — I10 Essential (primary) hypertension: Secondary | ICD-10-CM

## 2020-04-25 DIAGNOSIS — J449 Chronic obstructive pulmonary disease, unspecified: Secondary | ICD-10-CM

## 2020-04-25 DIAGNOSIS — F411 Generalized anxiety disorder: Secondary | ICD-10-CM

## 2020-04-25 DIAGNOSIS — F5104 Psychophysiologic insomnia: Secondary | ICD-10-CM

## 2020-04-25 DIAGNOSIS — F332 Major depressive disorder, recurrent severe without psychotic features: Secondary | ICD-10-CM

## 2020-04-25 NOTE — Chronic Care Management (AMB) (Signed)
Chronic Care Management    Clinical Social Work Note  04/25/2020 Name: ADALAIDE JASKOLSKI MRN: [id number] DOB: 14-Nov-1945  GENORA ARP is a 75 y.o. year old female who is a primary care patient of Lorine Bears, Lupita Raider, FNP. The CCM team was consulted to assist the patient with chronic disease management and/or care coordination needs related to: Mental Health Counseling and Resources.   Engaged with patient by telephone for follow up visit in response to provider referral for social work chronic care management and care coordination services.   Consent to Services:  The patient was given the following information about Chronic Care Management services today, agreed to services, and gave verbal consent: 1. CCM service includes personalized support from designated clinical staff supervised by the primary care provider, including individualized plan of care and coordination with other care providers 2. 24/7 contact phone numbers for assistance for urgent and routine care needs. 3. Service will only be billed when office clinical staff spend 20 minutes or more in a month to coordinate care. 4. Only one practitioner may furnish and bill the service in a calendar month. 5.The patient may stop CCM services at any time (effective at the end of the month) by phone call to the office staff. 6. The patient will be responsible for cost sharing (co-pay) of up to 20% of the service fee (after annual deductible is met). Patient agreed to services and consent obtained.  Patient agreed to services and consent obtained.   Assessment: Review of patient past medical history, allergies, medications, and health status, including review of relevant consultants reports was performed today as part of a comprehensive evaluation and provision of chronic care management and care coordination services.     SDOH (Social Determinants of Health) assessments and interventions performed:    Advanced Directives Status: Not addressed  in this encounter.  CCM Care Plan  Allergies  Allergen Reactions  . Bextra  [Valdecoxib]   . Compazine [Prochlorperazine Edisylate]     Stroke-like symptoms  . Lithium Carbonate     Leg weakness  . Lyrica [Pregabalin]     Outpatient Encounter Medications as of 04/25/2020  Medication Sig Note  . acetaminophen (TYLENOL) 500 MG tablet Take 500 mg by mouth 2 (two) times daily as needed.    Marland Kitchen aspirin 81 MG chewable tablet Chew 81 mg daily by mouth.   Marland Kitchen atorvastatin (LIPITOR) 40 MG tablet TAKE 1 TABLET BY MOUTH EVERY DAY   . azelastine (ASTELIN) 0.1 % nasal spray Place 2 sprays into both nostrils 2 (two) times daily.   . clonazePAM (KLONOPIN) 0.5 MG tablet Take one tablet 0.96m every other day as needed for anxiety   . COMBIVENT RESPIMAT 20-100 MCG/ACT AERS respimat INHALE 1 PUFF BY MOUTH EVERY 6 HOURS   . DULoxetine (CYMBALTA) 20 MG capsule Take 1 capsule (20 mg total) by mouth daily.   .Marland KitchenFLUoxetine (PROZAC) 20 MG tablet Take 3 tablets (60 mg total) by mouth daily.   . Fluticasone-Umeclidin-Vilant (TRELEGY ELLIPTA) 200-62.5-25 MCG/INH AEPB Inhale 1 puff into the lungs daily.   . furosemide (LASIX) 20 MG tablet Take 1 tablet (20 mg total) by mouth daily.   .Marland Kitchengabapentin (NEURONTIN) 300 MG capsule Take 2 capsules (600 mg total) by mouth 3 (three) times daily. 02/02/2020: Reports taking 2 capsules (600 mg) twice daily  . hydrOXYzine (ATARAX/VISTARIL) 25 MG tablet Take 25-50 mg by mouth daily as needed.   . magic mouthwash w/lidocaine SOLN Swish, gargle, and spit one to two  teaspoonfuls every six hours as needed. Shake well before using.   . Melatonin 3 MG CAPS Take 10 mg by mouth at bedtime as needed (sleep).    . metoprolol succinate (TOPROL-XL) 25 MG 24 hr tablet TAKE 1/2 TABLET(12.5 MG) BY MOUTH DAILY   . montelukast (SINGULAIR) 10 MG tablet Take 1 tablet (10 mg total) by mouth at bedtime.   . SEROQUEL 100 MG tablet Take 1 tablet (100 mg total) by mouth at bedtime.   . triamcinolone ointment  (KENALOG) 0.5 % Apply 1 application topically 2 (two) times daily.    Facility-Administered Encounter Medications as of 04/25/2020  Medication  . albuterol (PROVENTIL) (2.5 MG/3ML) 0.083% nebulizer solution 2.5 mg    Patient Active Problem List   Diagnosis Date Noted  . Rash 01/14/2020  . Pulmonary hypertension ([place]) 09/14/2019  . Coccydynia 07/29/2019  . Cervicalgia 05/04/2019  . Acute pain of left shoulder 05/04/2019  . Elbow pain, left 05/04/2019  . DOE (dyspnea on exertion) 03/23/2019  . Leg swelling 03/22/2019    Class: Acute  . Lumbar degenerative disc disease 09/10/2018  . Left hip pain 09/10/2018  . Post-traumatic osteoarthritis of left elbow 07/14/2018  . Lumbar radiculopathy 02/12/2018  . Lumbar spondylosis 02/12/2018  . Lumbar facet arthropathy 02/12/2018  . Atherosclerosis of aorta ([place]) 09/17/2017  . Lung nodule 09/17/2017  . Urinary tract infection 12/05/2016  . Protein-calorie malnutrition, mild ([place]) 08/07/2016  . Generalized anxiety disorder 07/15/2016  . Chronic pain syndrome 07/15/2016  . Severe episode of recurrent major depressive disorder, without psychotic features ([place]) 07/14/2016  . Moderate benzodiazepine use disorder ([place]) 05/08/2016  . Major depressive disorder, recurrent, severe w/o psychotic behavior ([place]) 05/01/2016  . Seasonal allergic rhinitis 03/23/2015  . Hyperglycemia 03/23/2015  . Senile purpura ([place]) 03/23/2015  . Perennial allergic rhinitis with seasonal variation 03/23/2015  . Marital problems 10/31/2014  . Migraine without aura and without status migrainosus, not intractable 10/31/2014  . Chronic bilateral low back pain without sciatica 08/09/2014  . Colon polyp 08/09/2014  . COPD, severe ([place]) 08/09/2014  . CVA, old, hemiparesis ([place]) 08/09/2014  . Dyslipidemia 08/09/2014  . Dysfunction of eustachian tube 08/09/2014  . Fibromyalgia syndrome 08/09/2014  . Gastro-esophageal reflux disease without esophagitis 08/09/2014  . Benign  migrating glossitis 08/09/2014  . Cerebrovascular accident, old 08/09/2014  . IBS (irritable bowel syndrome) 08/09/2014  . Low back pain with radiation 08/09/2014  . Chronic recurrent major depressive disorder ([place]) 08/09/2014  . Dysmetabolic syndrome 19/41/7408  . OP (osteoporosis) 08/09/2014  . Vitamin D deficiency 08/09/2014  . Smoking 08/09/2014  . Benign hypertension 07/19/2013  . Benign neoplasm of skin of trunk 06/03/2013  . H/O: pneumonia 09/25/2012    Conditions to be addressed/monitored: Anxiety and Depression; ADL IADL limitations, Mental Health Concerns  and Social Isolation  Care Plan : General Social Work (Adult)  Updates made by Greg Cutter, LCSW since 04/25/2020 12:00 AM    Problem: Response to Treatment (Depression)     Long-Range Goal: Response to Treatment Maximized   Start Date: 04/25/2020  Recent Progress: On track  Priority: Medium  Note:    Timeframe:  Long-Range Goal Priority:  Medium Start Date:  04/25/20                          Expected End Date:  07/26/20                 Follow Up Date-05/16/20   - avoid negative  self-talk - develop a personal safety plan - develop a plan to deal with triggers like holidays, anniversaries - exercise at least 2 to 3 times per week - have a plan for how to handle bad days - journal feelings and what helps to feel better or worse - spend time or talk with others at least 2 to 3 times per week - spend time or talk with others every day - watch for early signs of feeling worse - write in journal every day    Why is this important?    Keeping track of your progress will help your treatment team find the right mix of medicine and therapy for you.   Write in your journal every day.   Day-to-day changes in depression symptoms are normal. It may be more helpful to check your progress at the end of each week instead of every day.     Current Barriers:  . Chronic Mental Health needs related to  depression . Financial constraints related to managing health care expenses . Limited social support . ADL IADL limitations . Limited access to caregiver . Inability to perform ADL's independently . Suicidal Ideation/Homicidal Ideation: No  Clinical Social Work Goal(s):  Marland Kitchen Over the next 120 days, patient will work with SW bi-monthly by telephone or in person to reduce or manage symptoms related to depression . Over the next 120 days, patient will work with SW to address concerns related to decreasing depression and increasing self-care  Interventions: . Patient interviewed and appropriate assessments performed: brief mental health assessment . CCM LCSW spoke to son on 04/25/20. Per son, patient still has been unable to gain a psychiatrist. CCM LCSW will place an additional referral today on 04/25/20 for psychiatry services with [facility]. Marland Kitchen Update 04/11/20- Provided mental health counseling with regard to depression and anxiety. Patient reports managing her mental health well at this time. Patient shares that her anxiety is worse than her depression at this time. Patient reports getting outside and getting fresh air helps with anxiety. LCSW educated patient on coping methods to implement into her daily life to combat depressive symptoms and stress. Patient denied any suicidal or homicidal ideations. Encouraged patient to implement deep breathing exercises into her daily routine due to ongoing stress. Patient reports that she is actively working on both her physical and mental health. She shares that her legs continue to swell even though she is now on Lasix. Patient reports that she is using her compression stockings during the day and elevating her legs at night as needed. Patient is still hopeful for some relief from the swelling.  . Patient reports that she only experiences SOB when she walks for a long period of time . Patient reports that her psychiatrist in [place] wanted her to discontinue services  and find a mental health provider that is closer to her location. Her psychiatrist referred her to a psychiatrist in [place], [place]. Patient is unable to state the name of this agency but reports that her initial appointment is on 02/21/20 and is telephonic. Patient reports that they had to reschedule this appointment for tomorrow at 11:15. Patient will complete new patient appointment with psychiatrist tomorrow. She confirms that this is a telephonic appointment. Patient will contact CCM LCSW if she has any issues with gaining new mental health services.  UPDATE- Patient reports that she still needs a psychiatrist as her telephonic appointment was with a counselor named Jenny Reichmann who has his own private practice and patient is still in  need of psychiatry support. Patient is agreeable to [facility] referral. CCM LCSW will place referral today on 04/11/20. UPDATE 04/25/20- Per son, patient has not heard from [facility] yet. CCM LCSW place an additional referral today. . Discussed plans with patient for ongoing care management follow up and provided patient with direct contact information for care management team . Provided education and assistance to client regarding Advanced Directives. . A voluntary and extensive discussion about advanced care planning including explanation and discussion of advanced was undertaken with the patient. Explanation regarding healthcare proxy and living will was reviewed and packet with forms with explanation of how to fill them out was given.   . Provided education to patient/caregiver about Hospice and/or Palliative Care services . Patient reports that her rash is gone that she was concerned about in December of 2021. Marland Kitchen Patient reports that she lives alone and only drives when she needs to. She reports that her son that lives in [place] and calls and visits her weekly to check on her needs. He also provides stable transportation for all of her appointments.  . Mindfulness or Relaxation Training  provided during session for patient to implement when faced with future triggers . Patient reports implementing appropriate self-care by getting up each day and walking outside to get some fresh air. LCSW provided positive reinforcement for this accomplishment. . Patient denies any issues with her sleep routine. She reports that she takes a melatonin vitamin every night which helps her to unwind from the day.  . Son reports that patient is doing well and has improved her socialization by going out to eat and running errands with her neighbor friends that live in her same complex. Patient reports that she likes living alone and has several friends if she desires socialization or someone to talk to.   Patient Self Care Activities:  . Calls provider office for new concerns or questions . Ability for insight . Motivation for treatment  Patient Coping Strengths:  . Hopefulness . Self Advocate . Able to Communicate Effectively  Patient Self Care Deficits:  . Lacks social connections  Please see past updates related to this goal by clicking on the "Past Updates" button in the selected goal        Follow Up Plan: SW will follow up with patient by phone over the next 30 days      Eula Fried, [employer], MSW, [email or web address] Phone: [phone]

## 2020-04-25 NOTE — Patient Instructions (Signed)
Licensed Clinical Social Worker Visit Information  Goals we discussed today:  Goals Addressed            This Visit's Progress   . SW-Track and Manage My Symptoms-Depression        Timeframe:  Long-Range Goal Priority:  Medium Start Date:  04/25/20                          Expected End Date:  07/26/20                 Follow Up Date-05/16/20   - avoid negative self-talk - develop a personal safety plan - develop a plan to deal with triggers like holidays, anniversaries - exercise at least 2 to 3 times per week - have a plan for how to handle bad days - journal feelings and what helps to feel better or worse - spend time or talk with others at least 2 to 3 times per week - spend time or talk with others every day - watch for early signs of feeling worse - write in journal every day    Why is this important?    Keeping track of your progress will help your treatment team find the right mix of medicine and therapy for you.   Write in your journal every day.   Day-to-day changes in depression symptoms are normal. It may be more helpful to check your progress at the end of each week instead of every day.     Current Barriers:  . Chronic Mental Health needs related to depression . Financial constraints related to managing health care expenses . Limited social support . ADL IADL limitations . Limited access to caregiver . Inability to perform ADL's independently . Suicidal Ideation/Homicidal Ideation: No  Clinical Social Work Goal(s):  Marland Kitchen Over the next 120 days, patient will work with SW bi-monthly by telephone or in person to reduce or manage symptoms related to depression . Over the next 120 days, patient will work with SW to address concerns related to decreasing depression and increasing self-care  Interventions: . Patient interviewed and appropriate assessments performed: brief mental health assessment . CCM LCSW spoke to son on 04/25/20. Per son, patient still has been  unable to gain a psychiatrist. CCM LCSW will place an additional referral today on 04/25/20 for psychiatry services with [facility]. Marland Kitchen Update 04/11/20- Provided mental health counseling with regard to depression and anxiety. Patient reports managing her mental health well at this time. Patient shares that her anxiety is worse than her depression at this time. Patient reports getting outside and getting fresh air helps with anxiety. LCSW educated patient on coping methods to implement into her daily life to combat depressive symptoms and stress. Patient denied any suicidal or homicidal ideations. Encouraged patient to implement deep breathing exercises into her daily routine due to ongoing stress. Patient reports that she is actively working on both her physical and mental health. She shares that her legs continue to swell even though she is now on Lasix. Patient reports that she is using her compression stockings during the day and elevating her legs at night as needed. Patient is still hopeful for some relief from the swelling.  . Patient reports that she only experiences SOB when she walks for a long period of time . Patient reports that her psychiatrist in [place] wanted her to discontinue services and find a mental health provider that is closer to her location. Her psychiatrist  referred her to a psychiatrist in [place], [place]. Patient is unable to state the name of this agency but reports that her initial appointment is on 02/21/20 and is telephonic. Patient reports that they had to reschedule this appointment for tomorrow at 11:15. Patient will complete new patient appointment with psychiatrist tomorrow. She confirms that this is a telephonic appointment. Patient will contact CCM LCSW if she has any issues with gaining new mental health services.  UPDATE- Patient reports that she still needs a psychiatrist as her telephonic appointment was with a counselor named Jenny Reichmann who has his own private practice and patient is  still in need of psychiatry support. Patient is agreeable to [facility] referral. CCM LCSW will place referral today on 04/11/20. UPDATE 04/25/20- Per son, patient has not heard from [facility] yet. CCM LCSW place an additional referral today. . Discussed plans with patient for ongoing care management follow up and provided patient with direct contact information for care management team . Provided education and assistance to client regarding Advanced Directives. . A voluntary and extensive discussion about advanced care planning including explanation and discussion of advanced was undertaken with the patient. Explanation regarding healthcare proxy and living will was reviewed and packet with forms with explanation of how to fill them out was given.   . Provided education to patient/caregiver about Hospice and/or Palliative Care services . Patient reports that her rash is gone that she was concerned about in December of 2021. Marland Kitchen Patient reports that she lives alone and only drives when she needs to. She reports that her son that lives in [place] and calls and visits her weekly to check on her needs. He also provides stable transportation for all of her appointments.  . Mindfulness or Relaxation Training provided during session for patient to implement when faced with future triggers . Patient reports implementing appropriate self-care by getting up each day and walking outside to get some fresh air. LCSW provided positive reinforcement for this accomplishment. . Patient denies any issues with her sleep routine. She reports that she takes a melatonin vitamin every night which helps her to unwind from the day.  . Son reports that patient is doing well and has improved her socialization by going out to eat and running errands with her neighbor friends that live in her same complex. Patient reports that she likes living alone and has several friends if she desires socialization or someone to talk to.   Patient Self Care  Activities:  . Calls provider office for new concerns or questions . Ability for insight . Motivation for treatment  Patient Coping Strengths:  . Hopefulness . Self Advocate . Able to Communicate Effectively  Patient Self Care Deficits:  . Lacks social connections  Please see past updates related to this goal by clicking on the "Past Updates" button in the selected goal         Eula Fried, [place], MSW, [email or web address] Phone: [phone]

## 2020-04-26 ENCOUNTER — Ambulatory Visit: Payer: Medicare Other | Admitting: Licensed Clinical Social Worker

## 2020-04-26 DIAGNOSIS — I10 Essential (primary) hypertension: Secondary | ICD-10-CM | POA: Diagnosis not present

## 2020-04-26 DIAGNOSIS — E785 Hyperlipidemia, unspecified: Secondary | ICD-10-CM

## 2020-04-26 DIAGNOSIS — J449 Chronic obstructive pulmonary disease, unspecified: Secondary | ICD-10-CM

## 2020-04-26 DIAGNOSIS — F411 Generalized anxiety disorder: Secondary | ICD-10-CM

## 2020-04-26 DIAGNOSIS — F332 Major depressive disorder, recurrent severe without psychotic features: Secondary | ICD-10-CM | POA: Diagnosis not present

## 2020-04-26 DIAGNOSIS — F5104 Psychophysiologic insomnia: Secondary | ICD-10-CM

## 2020-04-26 NOTE — Patient Instructions (Signed)
Licensed Clinical Social Worker Visit Information  Goals we discussed today:  Goals Addressed            This Visit's Progress   . SW-Track and Manage My Symptoms-Depression        Timeframe:  Long-Range Goal Priority:  Medium Start Date:  04/25/20                          Expected End Date:  07/26/20                 Follow Up Date-05/16/20   - avoid negative self-talk - develop a personal safety plan - develop a plan to deal with triggers like holidays, anniversaries - exercise at least 2 to 3 times per week - have a plan for how to handle bad days - journal feelings and what helps to feel better or worse - spend time or talk with others at least 2 to 3 times per week - spend time or talk with others every day - watch for early signs of feeling worse - write in journal every day    Why is this important?    Keeping track of your progress will help your treatment team find the right mix of medicine and therapy for you.   Write in your journal every day.   Day-to-day changes in depression symptoms are normal. It may be more helpful to check your progress at the end of each week instead of every day.     Current Barriers:  . Chronic Mental Health needs related to depression . Financial constraints related to managing health care expenses . Limited social support . ADL IADL limitations . Limited access to caregiver . Inability to perform ADL's independently . Suicidal Ideation/Homicidal Ideation: No  Clinical Social Work Goal(s):  Marland Kitchen Over the next 120 days, patient will work with SW bi-monthly by telephone or in person to reduce or manage symptoms related to depression . Over the next 120 days, patient will work with SW to address concerns related to decreasing depression and increasing self-care  Interventions:  . Patient interviewed and appropriate assessments performed: brief mental health assessment . Patient returned CCM LCSW's call on 04/26/20. Patient reports that  she has not heard from [facility]. CCM LCSW informed patient that she completed two referrals for psychiatry services with [facility] on 04/11/20 and 04/25/20. CCM LCSW provided patient with [facility]'s contact information and encouraged her to contact them to inquire about enrollment in their program. Patient agreeable this request.  . CCM LCSW spoke to son on 04/25/20. Per son, patient still has been unable to gain a psychiatrist. CCM LCSW will place an additional referral today on 04/25/20 for psychiatry services with [facility]. . Patient was informed that current CCM LCSW will be leaving position next month and her next CCM Social Work follow up visit will be with another LCSW. Patient was appreciative of support provided and receptive to news . Update 04/11/20- Provided mental health counseling with regard to depression and anxiety. Patient reports managing her mental health well at this time. Patient shares that her anxiety is worse than her depression at this time. Patient reports getting outside and getting fresh air helps with anxiety. LCSW educated patient on coping methods to implement into her daily life to combat depressive symptoms and stress. Patient denied any suicidal or homicidal ideations. Encouraged patient to implement deep breathing exercises into her daily routine due to ongoing stress. Patient reports that she  is actively working on both her physical and mental health. She shares that her legs continue to swell even though she is now on Lasix. Patient reports that she is using her compression stockings during the day and elevating her legs at night as needed. Patient is still hopeful for some relief from the swelling.  . Patient reports that she only experiences SOB when she walks for a long period of time . Patient reports that her psychiatrist in [place] wanted her to discontinue services and find a mental health provider that is closer to her location. Her psychiatrist referred her to a psychiatrist in  [place], [place]. Patient is unable to state the name of this agency but reports that her initial appointment is on 02/21/20 and is telephonic. Patient reports that they had to reschedule this appointment for tomorrow at 11:15. Patient will complete new patient appointment with psychiatrist tomorrow. She confirms that this is a telephonic appointment. Patient will contact CCM LCSW if she has any issues with gaining new mental health services.  UPDATE- Patient reports that she still needs a psychiatrist as her telephonic appointment was with a counselor named Jenny Reichmann who has his own private practice and patient is still in need of psychiatry support. Patient is agreeable to [facility] referral. CCM LCSW will place referral today on 04/11/20. UPDATE 04/25/20- Per son, patient has not heard from [facility] yet. CCM LCSW place an additional referral today. . Discussed plans with patient for ongoing care management follow up and provided patient with direct contact information for care management team . Provided education and assistance to client regarding Advanced Directives. . A voluntary and extensive discussion about advanced care planning including explanation and discussion of advanced was undertaken with the patient. Explanation regarding healthcare proxy and living will was reviewed and packet with forms with explanation of how to fill them out was given.   . Provided education to patient/caregiver about Hospice and/or Palliative Care services . Patient reports that her rash is gone that she was concerned about in December of 2021. Marland Kitchen Patient reports that she lives alone and only drives when she needs to. She reports that her son that lives in [place] and calls and visits her weekly to check on her needs. He also provides stable transportation for all of her appointments.  . Mindfulness or Relaxation Training provided during session for patient to implement when faced with future triggers . Patient reports implementing appropriate  self-care by getting up each day and walking outside to get some fresh air. LCSW provided positive reinforcement for this accomplishment. . Patient denies any issues with her sleep routine. She reports that she takes a melatonin vitamin every night which helps her to unwind from the day.  . Son reports that patient is doing well and has improved her socialization by going out to eat and running errands with her neighbor friends that live in her same complex. Patient reports that she likes living alone and has several friends if she desires socialization or someone to talk to.   Patient Self Care Activities:  . Calls provider office for new concerns or questions . Ability for insight . Motivation for treatment  Patient Coping Strengths:  . Hopefulness . Self Advocate . Able to Communicate Effectively  Patient Self Care Deficits:  . Lacks social connections  Please see past updates related to this goal by clicking on the "Past Updates" button in the selected goal         Eula Fried, [place], MSW, LCSW  [email or web address] Phone: [phone]

## 2020-04-26 NOTE — Chronic Care Management (AMB) (Signed)
Chronic Care Management    Clinical Social Work Note  04/26/2020 Name: Beth [name] MRN: [id number] DOB: Jun 18, 1945  Beth [name] is a 75 y.o. year old female who is a primary care patient of Lorine Bears, Lupita Raider, FNP. The CCM team was consulted to assist the patient with chronic disease management and/or care coordination needs related to: Mental Health Counseling and Resources.   Engaged with patient by telephone for follow up visit in response to provider referral for social work chronic care management and care coordination services.   Consent to Services:  The patient was given the following information about Chronic Care Management services today, agreed to services, and gave verbal consent: 1. CCM service includes personalized support from designated clinical staff supervised by the primary care provider, including individualized plan of care and coordination with other care providers 2. 24/7 contact phone numbers for assistance for urgent and routine care needs. 3. Service will only be billed when office clinical staff spend 20 minutes or more in a month to coordinate care. 4. Only one practitioner may furnish and bill the service in a calendar month. 5.The patient may stop CCM services at any time (effective at the end of the month) by phone call to the office staff. 6. The patient will be responsible for cost sharing (co-pay) of up to 20% of the service fee (after annual deductible is met). Patient agreed to services and consent obtained.  Patient agreed to services and consent obtained.   Assessment: Review of patient past medical history, allergies, medications, and health status, including review of relevant consultants reports was performed today as part of a comprehensive evaluation and provision of chronic care management and care coordination services.     SDOH (Social Determinants of Health) assessments and interventions performed:    Advanced Directives Status: Not addressed  in this encounter.  CCM Care Plan  Allergies  Allergen Reactions  . Bextra  [Valdecoxib]   . Compazine [Prochlorperazine Edisylate]     Stroke-like symptoms  . Lithium Carbonate     Leg weakness  . Lyrica [Pregabalin]     Outpatient Encounter Medications as of 04/26/2020  Medication Sig Note  . acetaminophen (TYLENOL) 500 MG tablet Take 500 mg by mouth 2 (two) times daily as needed.    Marland Kitchen aspirin 81 MG chewable tablet Chew 81 mg daily by mouth.   Marland Kitchen atorvastatin (LIPITOR) 40 MG tablet TAKE 1 TABLET BY MOUTH EVERY DAY   . azelastine (ASTELIN) 0.1 % nasal spray Place 2 sprays into both nostrils 2 (two) times daily.   . clonazePAM (KLONOPIN) 0.5 MG tablet Take one tablet 0.64m every other day as needed for anxiety   . COMBIVENT RESPIMAT 20-100 MCG/ACT AERS respimat INHALE 1 PUFF BY MOUTH EVERY 6 HOURS   . DULoxetine (CYMBALTA) 20 MG capsule Take 1 capsule (20 mg total) by mouth daily.   .Marland KitchenFLUoxetine (PROZAC) 20 MG tablet Take 3 tablets (60 mg total) by mouth daily.   . Fluticasone-Umeclidin-Vilant (TRELEGY ELLIPTA) 200-62.5-25 MCG/INH AEPB Inhale 1 puff into the lungs daily.   . furosemide (LASIX) 20 MG tablet Take 1 tablet (20 mg total) by mouth daily.   .Marland Kitchengabapentin (NEURONTIN) 300 MG capsule Take 2 capsules (600 mg total) by mouth 3 (three) times daily. 02/02/2020: Reports taking 2 capsules (600 mg) twice daily  . hydrOXYzine (ATARAX/VISTARIL) 25 MG tablet Take 25-50 mg by mouth daily as needed.   . magic mouthwash w/lidocaine SOLN Swish, gargle, and spit one to two  teaspoonfuls every six hours as needed. Shake well before using.   . Melatonin 3 MG CAPS Take 10 mg by mouth at bedtime as needed (sleep).    . metoprolol succinate (TOPROL-XL) 25 MG 24 hr tablet TAKE 1/2 TABLET(12.5 MG) BY MOUTH DAILY   . montelukast (SINGULAIR) 10 MG tablet Take 1 tablet (10 mg total) by mouth at bedtime.   . SEROQUEL 100 MG tablet Take 1 tablet (100 mg total) by mouth at bedtime.   . triamcinolone ointment  (KENALOG) 0.5 % Apply 1 application topically 2 (two) times daily.    Facility-Administered Encounter Medications as of 04/26/2020  Medication  . albuterol (PROVENTIL) (2.5 MG/3ML) 0.083% nebulizer solution 2.5 mg    Patient Active Problem List   Diagnosis Date Noted  . Rash 01/14/2020  . Pulmonary hypertension ([place]) 09/14/2019  . Coccydynia 07/29/2019  . Cervicalgia 05/04/2019  . Acute pain of left shoulder 05/04/2019  . Elbow pain, left 05/04/2019  . DOE (dyspnea on exertion) 03/23/2019  . Leg swelling 03/22/2019    Class: Acute  . Lumbar degenerative disc disease 09/10/2018  . Left hip pain 09/10/2018  . Post-traumatic osteoarthritis of left elbow 07/14/2018  . Lumbar radiculopathy 02/12/2018  . Lumbar spondylosis 02/12/2018  . Lumbar facet arthropathy 02/12/2018  . Atherosclerosis of aorta ([place]) 09/17/2017  . Lung nodule 09/17/2017  . Urinary tract infection 12/05/2016  . Protein-calorie malnutrition, mild ([place]) 08/07/2016  . Generalized anxiety disorder 07/15/2016  . Chronic pain syndrome 07/15/2016  . Severe episode of recurrent major depressive disorder, without psychotic features ([place]) 07/14/2016  . Moderate benzodiazepine use disorder ([place]) 05/08/2016  . Major depressive disorder, recurrent, severe w/o psychotic behavior ([place]) 05/01/2016  . Seasonal allergic rhinitis 03/23/2015  . Hyperglycemia 03/23/2015  . Senile purpura ([place]) 03/23/2015  . Perennial allergic rhinitis with seasonal variation 03/23/2015  . Marital problems 10/31/2014  . Migraine without aura and without status migrainosus, not intractable 10/31/2014  . Chronic bilateral low back pain without sciatica 08/09/2014  . Colon polyp 08/09/2014  . COPD, severe ([place]) 08/09/2014  . CVA, old, hemiparesis ([place]) 08/09/2014  . Dyslipidemia 08/09/2014  . Dysfunction of eustachian tube 08/09/2014  . Fibromyalgia syndrome 08/09/2014  . Gastro-esophageal reflux disease without esophagitis 08/09/2014  . Benign  migrating glossitis 08/09/2014  . Cerebrovascular accident, old 08/09/2014  . IBS (irritable bowel syndrome) 08/09/2014  . Low back pain with radiation 08/09/2014  . Chronic recurrent major depressive disorder ([place]) 08/09/2014  . Dysmetabolic syndrome 37/16/9678  . OP (osteoporosis) 08/09/2014  . Vitamin D deficiency 08/09/2014  . Smoking 08/09/2014  . Benign hypertension 07/19/2013  . Benign neoplasm of skin of trunk 06/03/2013  . H/O: pneumonia 09/25/2012    Conditions to be addressed/monitored: Anxiety and Depression; Mental Health Concerns   Care Plan : General Social Work (Adult)  Updates made by Greg Cutter, LCSW since 04/26/2020 12:00 AM    Problem: Response to Treatment (Depression)     Long-Range Goal: Response to Treatment Maximized   Start Date: 04/25/2020  Recent Progress: On track  Priority: Medium  Note:    Timeframe:  Long-Range Goal Priority:  Medium Start Date:  04/25/20                          Expected End Date:  07/26/20                 Follow Up Date-05/16/20   - avoid negative self-talk - develop a personal safety  plan - develop a plan to deal with triggers like holidays, anniversaries - exercise at least 2 to 3 times per week - have a plan for how to handle bad days - journal feelings and what helps to feel better or worse - spend time or talk with others at least 2 to 3 times per week - spend time or talk with others every day - watch for early signs of feeling worse - write in journal every day    Why is this important?    Keeping track of your progress will help your treatment team find the right mix of medicine and therapy for you.   Write in your journal every day.   Day-to-day changes in depression symptoms are normal. It may be more helpful to check your progress at the end of each week instead of every day.     Current Barriers:  . Chronic Mental Health needs related to depression . Financial constraints related to managing health  care expenses . Limited social support . ADL IADL limitations . Limited access to caregiver . Inability to perform ADL's independently . Suicidal Ideation/Homicidal Ideation: No  Clinical Social Work Goal(s):  Marland Kitchen Over the next 120 days, patient will work with SW bi-monthly by telephone or in person to reduce or manage symptoms related to depression . Over the next 120 days, patient will work with SW to address concerns related to decreasing depression and increasing self-care  Interventions:  . Patient interviewed and appropriate assessments performed: brief mental health assessment . Patient returned CCM LCSW's call on 04/26/20. Patient reports that she has not heard from [facility]. CCM LCSW informed patient that she completed two referrals for psychiatry services with [facility] on 04/11/20 and 04/25/20. CCM LCSW provided patient with [facility]'s contact information and encouraged her to contact them to inquire about enrollment in their program. Patient agreeable this request.  . CCM LCSW spoke to son on 04/25/20. Per son, patient still has been unable to gain a psychiatrist. CCM LCSW will place an additional referral today on 04/25/20 for psychiatry services with [facility]. . Patient was informed that current CCM LCSW will be leaving position next month and her next CCM Social Work follow up visit will be with another LCSW. Patient was appreciative of support provided and receptive to news . Update 04/11/20- Provided mental health counseling with regard to depression and anxiety. Patient reports managing her mental health well at this time. Patient shares that her anxiety is worse than her depression at this time. Patient reports getting outside and getting fresh air helps with anxiety. LCSW educated patient on coping methods to implement into her daily life to combat depressive symptoms and stress. Patient denied any suicidal or homicidal ideations. Encouraged patient to implement deep breathing exercises into her daily  routine due to ongoing stress. Patient reports that she is actively working on both her physical and mental health. She shares that her legs continue to swell even though she is now on Lasix. Patient reports that she is using her compression stockings during the day and elevating her legs at night as needed. Patient is still hopeful for some relief from the swelling.  . Patient reports that she only experiences SOB when she walks for a long period of time . Patient reports that her psychiatrist in [place] wanted her to discontinue services and find a mental health provider that is closer to her location. Her psychiatrist referred her to a psychiatrist in [place], [place]. Patient is unable to state  the name of this agency but reports that her initial appointment is on 02/21/20 and is telephonic. Patient reports that they had to reschedule this appointment for tomorrow at 11:15. Patient will complete new patient appointment with psychiatrist tomorrow. She confirms that this is a telephonic appointment. Patient will contact CCM LCSW if she has any issues with gaining new mental health services.  UPDATE- Patient reports that she still needs a psychiatrist as her telephonic appointment was with a counselor named Jenny Reichmann who has his own private practice and patient is still in need of psychiatry support. Patient is agreeable to [facility] referral. CCM LCSW will place referral today on 04/11/20. UPDATE 04/25/20- Per son, patient has not heard from [facility] yet. CCM LCSW place an additional referral today. . Discussed plans with patient for ongoing care management follow up and provided patient with direct contact information for care management team . Provided education and assistance to client regarding Advanced Directives. . A voluntary and extensive discussion about advanced care planning including explanation and discussion of advanced was undertaken with the patient. Explanation regarding healthcare proxy and living will was  reviewed and packet with forms with explanation of how to fill them out was given.   . Provided education to patient/caregiver about Hospice and/or Palliative Care services . Patient reports that her rash is gone that she was concerned about in December of 2021. Marland Kitchen Patient reports that she lives alone and only drives when she needs to. She reports that her son that lives in [place] and calls and visits her weekly to check on her needs. He also provides stable transportation for all of her appointments.  . Mindfulness or Relaxation Training provided during session for patient to implement when faced with future triggers . Patient reports implementing appropriate self-care by getting up each day and walking outside to get some fresh air. LCSW provided positive reinforcement for this accomplishment. . Patient denies any issues with her sleep routine. She reports that she takes a melatonin vitamin every night which helps her to unwind from the day.  . Son reports that patient is doing well and has improved her socialization by going out to eat and running errands with her neighbor friends that live in her same complex. Patient reports that she likes living alone and has several friends if she desires socialization or someone to talk to.   Patient Self Care Activities:  . Calls provider office for new concerns or questions . Ability for insight . Motivation for treatment  Patient Coping Strengths:  . Hopefulness . Self Advocate . Able to Communicate Effectively  Patient Self Care Deficits:  . Lacks social connections  Please see past updates related to this goal by clicking on the "Past Updates" button in the selected goal        Follow Up Plan: SW will follow up with patient by phone over the next quarter      Eula Fried, [place], MSW, [email or web address] Phone: [phone]

## 2020-04-27 ENCOUNTER — Ambulatory Visit: Payer: Medicare Other

## 2020-05-10 ENCOUNTER — Telehealth: Payer: Self-pay

## 2020-05-12 ENCOUNTER — Telehealth: Payer: Self-pay

## 2020-05-12 ENCOUNTER — Telehealth: Payer: Self-pay | Admitting: Pharmacist

## 2020-05-12 NOTE — Telephone Encounter (Signed)
  Chronic Care Management   Outreach Note  05/12/2020 Name: Beth [name] MRN: [id number] DOB: 24-Jun-1945  Referred by: Verl Bangs, FNP Reason for referral : No chief complaint on file.  Was unable to reach patient via telephone today and have left HIPAA compliant voicemail asking patient to return my call.   Follow Up Plan: Will collaborate with Care Guide to outreach to schedule follow up with me  [name], PharmD, [facility] [phone]

## 2020-05-16 ENCOUNTER — Telehealth: Payer: Self-pay

## 2020-05-17 ENCOUNTER — Other Ambulatory Visit: Payer: Self-pay | Admitting: Family Medicine

## 2020-05-17 DIAGNOSIS — F332 Major depressive disorder, recurrent severe without psychotic features: Secondary | ICD-10-CM

## 2020-05-17 DIAGNOSIS — F5104 Psychophysiologic insomnia: Secondary | ICD-10-CM

## 2020-05-17 NOTE — Telephone Encounter (Signed)
Please reschedule [facility]

## 2020-05-17 NOTE — Telephone Encounter (Signed)
Patient rescheduled for 06/02/2020

## 2020-05-17 NOTE — Telephone Encounter (Signed)
Requested medication (s) are due for refill today:   Provider to review  Requested medication (s) are on the active medication list:   Yes  Future visit scheduled:   No   Last ordered: 04/19/2020 #30, 1 refill  Non delegated refill   Requested Prescriptions  Pending Prescriptions Disp Refills   QUEtiapine (SEROQUEL) 100 MG tablet [Pharmacy Med Name: QUETIAPINE 100MG  TABLETS] 30 tablet 1    Sig: TAKE 1 TABLET(100 MG) BY MOUTH AT BEDTIME      Not Delegated - Psychiatry:  Antipsychotics - Second Generation (Atypical) - quetiapine Failed - 05/17/2020 10:20 AM      Failed - This refill cannot be delegated      Passed - ALT in normal range and within 180 days    ALT  Date Value Ref Range Status  01/14/2020 27 6 - 29 U/L Final   SGPT (ALT)  Date Value Ref Range Status  12/20/2012 14 12 - 78 U/L Final          Passed - AST in normal range and within 180 days    AST  Date Value Ref Range Status  01/14/2020 23 10 - 35 U/L Final   SGOT(AST)  Date Value Ref Range Status  12/20/2012 23 15 - 37 Unit/L Final          Passed - Completed PHQ-2 or PHQ-9 in the last 360 days      Passed - Last BP in normal range    BP Readings from Last 1 Encounters:  04/19/20 (!) 123/59          Passed - Valid encounter within last 6 months    Recent Outpatient Visits           4 weeks ago Major depressive disorder, recurrent, severe w/o psychotic behavior ([place])   [facility] [place], [name], DO   4 months ago [facility], Lupita Raider, FNP   10 months ago Benign hypertension   [place], FNP   1 year ago Leg swelling   [place], FNP   1 year ago Leg swelling   [facility], Lupita Raider, [place]

## 2020-05-23 ENCOUNTER — Telehealth: Payer: Self-pay | Admitting: Licensed Clinical Social Worker

## 2020-05-23 ENCOUNTER — Telehealth: Payer: Self-pay

## 2020-05-23 NOTE — Telephone Encounter (Signed)
    Clinical Social Work  Care Management   Phone Outreach    05/23/2020 Name: Beth [name] MRN: [id number] DOB: 1945/02/25  Beth [name] is a 75 y.o. year old female who is a primary care patient of Lorine Bears, Lupita Raider, FNP .   CCM LCSW reached out to patient today by phone to introduce self, assess needs and offer Care Management services and interventions. Telephone outreach was unsuccessful and a HIPPA compliant phone message was left for the patient providing contact information and requesting a return call. LCSW made contact with patient's son, who agreed to relay the message for a return call today.    Plan:CCM LCSW will wait for return call. If no return call is received, Will reach out to patient again in the next 7 days .   Review of patient status, including review of consultants reports, relevant laboratory and other test results, and collaboration with appropriate care team members and the patient's provider was performed as part of comprehensive patient evaluation and provision of care management services.    Christa See, MSW, [place] [facility] Care Management [email or web address] Phone [phone] 4:30 PM

## 2020-05-25 ENCOUNTER — Ambulatory Visit (INDEPENDENT_AMBULATORY_CARE_PROVIDER_SITE_OTHER): Payer: Medicare Other | Admitting: Pharmacist

## 2020-05-25 ENCOUNTER — Other Ambulatory Visit: Payer: Self-pay

## 2020-05-25 ENCOUNTER — Telehealth: Payer: Self-pay | Admitting: Pharmacist

## 2020-05-25 DIAGNOSIS — J449 Chronic obstructive pulmonary disease, unspecified: Secondary | ICD-10-CM

## 2020-05-25 DIAGNOSIS — I10 Essential (primary) hypertension: Secondary | ICD-10-CM | POA: Diagnosis not present

## 2020-05-25 DIAGNOSIS — F332 Major depressive disorder, recurrent severe without psychotic features: Secondary | ICD-10-CM

## 2020-05-25 MED ORDER — METOPROLOL SUCCINATE ER 25 MG PO TB24: ORAL_TABLET | ORAL | 2 refills | Status: DC

## 2020-05-25 MED ORDER — FUROSEMIDE 20 MG PO TABS: 20.0000 mg | ORAL_TABLET | Freq: Every day | ORAL | 3 refills | Status: DC

## 2020-05-25 MED ORDER — FLUOXETINE HCL 60 MG PO TABS: 60.0000 mg | ORAL_TABLET | Freq: Every day | ORAL | 1 refills | Status: DC

## 2020-05-25 NOTE — Patient Instructions (Signed)
Visit Information  PATIENT GOALS: Goals Addressed            This Visit's Progress   . Pharmacy Goals       Our goal bad cholesterol, or LDL, is less than 70. This is why it is important to continue taking your atorvastatin  Please check your home blood pressure, keep a log of the results and bring this with you to your medical appointments.  Feel free to call me with any questions or concerns. I look forward to our next call!   [name], PharmD, [place] [phone]       The patient verbalized understanding of instructions, educational materials, and care plan provided today and declined offer to receive copy of patient instructions, educational materials, and care plan.   Telephone follow up appointment with care management team member as previously scheduled for:5/6 at 3 am

## 2020-05-25 NOTE — Telephone Encounter (Signed)
Patient requesting renewal of metoprolol ER and furosemide prescriptions to patient's [place]  Thank you! Grayland Ormond

## 2020-05-25 NOTE — Chronic Care Management (AMB) (Signed)
Chronic Care Management Pharmacy Note  05/25/2020 Name:  Beth [name] MRN:  [id number] DOB:  04-17-1945  Subjective: Beth [name] is an 75 y.o. year old female who is a primary patient of Malfi, Lupita Raider, FNP.  The CCM team was consulted for assistance with disease management and care coordination needs.    Receive message from patient requesting a call back.  Engaged with patient by telephone for follow up visit in response to provider referral for pharmacy case management and/or care coordination services.   Consent to Services:  The patient was given information about Chronic Care Management services, agreed to services, and gave verbal consent prior to initiation of services.  Please see initial visit note for detailed documentation.   Patient Care Team: Malfi, Lupita Raider, FNP as PCP - General (Family Medicine) Kate Sable, MD as PCP - Cardiology (Cardiology) [name], MD as Consulting Physician (Pain Medicine) Randel Pigg, MD as Referring Physician (Psychiatry) Jameshia Hayashida, Virl Diamond, RPH-CPP (Pharmacist) Vanita Ingles, RN as Case Manager (General Practice) Greg Cutter, LCSW as Social Worker (Licensed Clinical Social Worker)  Recent office visits: Office Visit with Dr. Parks Ranger on 3/23  Hospital visits: None in previous 6 months  Objective:  Lab Results  Component Value Date   CREATININE 1.00 (H) 01/14/2020   CREATININE 1.21 (H) 08/17/2019   CREATININE 0.95 (H) 03/23/2019   The 10-year ASCVD risk score Mikey Bussing DC Jr., et al., 2013) is: 48.8%   Values used to calculate the score:     Age: 60 years     Sex: Female     Is Non-Hispanic African American: No     Diabetic: Yes     Tobacco smoker: Yes     Systolic Blood Pressure: 993 mmHg     Is BP treated: Yes     HDL Cholesterol: 59 mg/dL     Total Cholesterol: 135 mg/dL    Social History   Tobacco Use  Smoking Status Current Every Day Smoker  . Packs/day: 0.25  . Years: 57.00   . Pack years: 14.25  . Types: Cigarettes  Smokeless Tobacco Never Used  Tobacco Comment   .5ppd currently   BP Readings from Last 3 Encounters:  04/19/20 (!) 123/59  04/03/20 95/75  03/27/20 128/61   Pulse Readings from Last 3 Encounters:  04/19/20 78  04/03/20 81  03/27/20 90   Wt Readings from Last 3 Encounters:  04/19/20 158 lb 12.8 oz (72 kg)  04/03/20 158 lb (71.7 kg)  03/27/20 160 lb 7.9 oz (72.8 kg)    Assessment: Review of patient past medical history, allergies, medications, health status, including review of consultants reports, laboratory and other test data, was performed as part of comprehensive evaluation and provision of chronic care management services.   SDOH:  (Social Determinants of Health) assessments and interventions performed: none   CCM Care Plan  Allergies  Allergen Reactions  . Bextra  [Valdecoxib]   . Compazine [Prochlorperazine Edisylate]     Stroke-like symptoms  . Lithium Carbonate     Leg weakness  . Lyrica [Pregabalin]     Medications Reviewed Today    Reviewed by Vella Raring, RPH-CPP (Pharmacist) on 05/25/20 at 1335  Med List Status: <None>  Medication Order Taking? Sig Documenting Provider Last Dose Status Informant  acetaminophen (TYLENOL) 500 MG tablet [id number] No Take 500 mg by mouth 2 (two) times daily as needed.   Patient not taking: Reported on 05/25/2020   [provider] Not Taking Active Pharmacy Records  Acetaminophen-Codeine 300-15 MG TABS [id number] Yes Take by mouth. 1 tablet every 4-6 hours as needed per dentist [provider] Taking Active   albuterol (PROVENTIL) (2.5 MG/3ML) 0.083% nebulizer solution 2.5 mg [id number]   Fredderick Severance, NP  Active   amoxicillin (AMOXIL) 875 MG tablet [id number] Yes Take 875 mg by mouth 2 (two) times daily. [provider] Taking Active   aspirin 81 MG chewable tablet [id number] Yes Chew 81 mg daily by mouth. [provider] Taking Active  Pharmacy Records           Med Note Francene Finders   Wed Dec 18, 2016  4:14 PM)    atorvastatin (LIPITOR) 40 MG tablet [id number] Yes TAKE 1 TABLET BY MOUTH EVERY DAY Malfi, Lupita Raider, FNP Taking Active   azelastine (ASTELIN) 0.1 % nasal spray [id number] Yes Place 2 sprays into both nostrils 2 (two) times daily. Mikey College, NP Taking Active   clonazePAM Bobbye [place]) 0.5 MG tablet [id number] Yes Take one tablet 0.5mg  every other day as needed for anxiety [name], DO Taking Active   COMBIVENT RESPIMAT 20-100 MCG/ACT AERS respimat [id number] Yes INHALE 1 PUFF BY MOUTH EVERY 6 HOURS Karamalegos, [name], DO Taking Active   DULoxetine (CYMBALTA) 20 MG capsule [id number] Yes Take 1 capsule (20 mg total) by mouth daily. [name], DO Taking Active   FLUoxetine 60 MG TABS [id number] No Take 60 mg by mouth daily.  Patient not taking: Reported on 05/25/2020   [name], DO Not Taking Active   Fluticasone-Umeclidin-Vilant [facility] ELLIPTA) 200-62.5-25 MCG/INH AEPB [id number] Yes Inhale 1 puff into the lungs daily. Parrett, Fonnie Mu, NP Taking Active   furosemide (LASIX) 20 MG tablet [id number] No Take 1 tablet (20 mg total) by mouth daily.  Patient not taking: Reported on 05/25/2020   Kate Sable, MD Not Taking Expired 11/15/19 2359   gabapentin (NEURONTIN) 300 MG capsule [id number] Yes Take 2 capsules (600 mg total) by mouth 3 (three) times daily. [name], MD Taking Active            Med Note [facility], Delanda Bulluck A   Wed Feb 02, 2020  5:33 PM) Reports taking 2 capsules (600 mg) twice daily  hydrOXYzine (ATARAX/VISTARIL) 25 MG tablet [id number] No Take 25-50 mg by mouth daily as needed.  Patient not taking: Reported on 05/25/2020   [provider] Not Taking Active   Melatonin 3 MG CAPS [id number] Yes Take 10 mg by mouth at bedtime as needed (sleep).  [provider] Taking Active Self  metoprolol succinate (TOPROL-XL) 25 MG 24 hr  tablet [id number] Yes TAKE 1/2 TABLET(12.5 MG) BY MOUTH DAILY Malfi, Lupita Raider, FNP Taking Active   montelukast (SINGULAIR) 10 MG tablet [id number] Yes Take 1 tablet (10 mg total) by mouth at bedtime. [name], DO Taking Active   QUEtiapine (SEROQUEL) 100 MG tablet [id number] Yes TAKE 1 TABLET(100 MG) BY MOUTH AT BEDTIME [name], DO Taking Active   triamcinolone ointment (KENALOG) 0.5 % [id number] No Apply 1 application topically 2 (two) times daily.  Patient not taking: Reported on 05/25/2020   Verl Bangs, FNP Not Taking Active   Med List Note Rise Patience, RN 02/12/18 1055): Medication agreement signed 02/12/2018 UDS 02/24/17 03/25/17 PA requested for gabapentin sent SB           Patient Active Problem List   Diagnosis Date Noted  . Rash 01/14/2020  . Pulmonary  hypertension ([place]) 09/14/2019  . Coccydynia 07/29/2019  . Cervicalgia 05/04/2019  . Acute pain of left shoulder 05/04/2019  . Elbow pain, left 05/04/2019  . DOE (dyspnea on exertion) 03/23/2019  . Leg swelling 03/22/2019    Class: Acute  . Lumbar degenerative disc disease 09/10/2018  . Left hip pain 09/10/2018  . Post-traumatic osteoarthritis of left elbow 07/14/2018  . Lumbar radiculopathy 02/12/2018  . Lumbar spondylosis 02/12/2018  . Lumbar facet arthropathy 02/12/2018  . Atherosclerosis of aorta ([place]) 09/17/2017  . Lung nodule 09/17/2017  . Urinary tract infection 12/05/2016  . Protein-calorie malnutrition, mild ([place]) 08/07/2016  . Generalized anxiety disorder 07/15/2016  . Chronic pain syndrome 07/15/2016  . Severe episode of recurrent major depressive disorder, without psychotic features ([place]) 07/14/2016  . Moderate benzodiazepine use disorder ([place]) 05/08/2016  . Major depressive disorder, recurrent, severe w/o psychotic behavior ([place]) 05/01/2016  . Seasonal allergic rhinitis 03/23/2015  . Hyperglycemia 03/23/2015  . Senile purpura ([place]) 03/23/2015  . Perennial allergic  rhinitis with seasonal variation 03/23/2015  . Marital problems 10/31/2014  . Migraine without aura and without status migrainosus, not intractable 10/31/2014  . Chronic bilateral low back pain without sciatica 08/09/2014  . Colon polyp 08/09/2014  . COPD, severe ([place]) 08/09/2014  . CVA, old, hemiparesis ([place]) 08/09/2014  . Dyslipidemia 08/09/2014  . Dysfunction of eustachian tube 08/09/2014  . Fibromyalgia syndrome 08/09/2014  . Gastro-esophageal reflux disease without esophagitis 08/09/2014  . Benign migrating glossitis 08/09/2014  . Cerebrovascular accident, old 08/09/2014  . IBS (irritable bowel syndrome) 08/09/2014  . Low back pain with radiation 08/09/2014  . Chronic recurrent major depressive disorder ([place]) 08/09/2014  . Dysmetabolic syndrome 99/83/3825  . OP (osteoporosis) 08/09/2014  . Vitamin D deficiency 08/09/2014  . Smoking 08/09/2014  . Benign hypertension 07/19/2013  . Benign neoplasm of skin of trunk 06/03/2013  . H/O: pneumonia 09/25/2012    Immunization History  Administered Date(s) Administered  . Influenza Split 11/13/2006, 11/02/2008  . Influenza, High Dose Seasonal PF 09/27/2014, 09/29/2015, 12/30/2016, 10/15/2018, 11/05/2019  . Influenza, Seasonal, Injecte, Preservative Fre 11/12/2010  . Influenza,inj,Quad PF,6+ Mos 10/08/2012  . Influenza-Unspecified 04/08/2014  . PFIZER(Purple Top)SARS-COV-2 Vaccination 03/14/2019, 04/09/2019  . PPD Test 05/16/2016  . Pneumococcal Conjugate-13 09/27/2014  . Pneumococcal Polysaccharide-23 06/12/2010  . Tdap 03/19/2007, 09/29/2015  . Zoster 04/16/2012    Conditions to be addressed/monitored: HTN, HLD and COPD   Care Plan : PharmD - Med Mgmt  Updates made by Vella Raring, RPH-CPP since 05/25/2020 12:00 AM    Problem: Disease Progression     Long-Range Goal: Disease Progression Prevented or Minimized   Start Date: 04/10/2020  Expected End Date: 07/09/2020  Recent Progress: On track  Priority: High  Note:    Current Barriers:  . Chronic Disease Management support, education, and care coordination needs related to previous CVA, COPD, hypertension, hyperlipidemia, depression  Pharmacist Clinical Goal(s):  Marland Kitchen Over the next 90 days, patient will achieve adherence to monitoring guidelines and medication adherence to achieve therapeutic efficacy through collaboration with PharmD and provider.   Interventions: . 1:1 collaboration with [name], DO regarding development and update of comprehensive plan of care as evidenced by provider attestation and co-signature . Inter-disciplinary care team collaboration (see longitudinal plan of care) . Receive a message from patient requesting a call back . Perform chart review. Patient seen for Office Visit with Dr. Parks Ranger on 3/23. Provider advised patient: o To try OTC Peroxyl debridement mouthwash - swish and spit to help the sore heal  o Provided refill of mental health medications while patient working on establishing care with new psychiatrist . Today patient reports that she saw her dentist yesterday for a broken tooth and that her dentist started her on amoxicillin 875 mg BID x 7 days, acetaminophen-codeine 300-15 mg - 1 tablet every 4-6 hours prn pain and referred her to an oral surgeon ([place]) o Counsel patient on importance of completing antibiotic course as prescribed by dentist.  - Patient confirms picked up and started course as directed yesterday. o Counsel patient to use caution for dizziness/sedation with use of as needed acetaminophen/codeine.  - Patient verbalizes understanding o Reports called oral surgeon yesterday and left a message and will call again today about appointment . Reports mouth sores have healed with use of mouthwash . Reports she called [facility] this morning to receive a list of in-network psychiatrists  . Comprehensive medication review performed; medication list updated in electronic  medical record o Identify patient out of fluoxetine. Note Rx for fluoxetine sent to pharmacy from PCP office today. Patient confirms will call pharmacy to confirm ready and then pick up. - Counsel patient to reach out to office for any new medical concerns  Moderate to Severe COPD:  . Patient followed by [facility] in [place] . Current treatment: o Trelegy 1 puff daily , rinse after use - Patient confirms rinsing after use. Reiterate importance of rinsing with water and spitting out (do not swallow) after each use to reduce risk of oral candidiasis o Combivent as needed . PFT completed on 09/09/2019 . Have counseled on benefits of smoking cessation  Medication Adherence: . Reports using weekly pillbox as adherence aid   Hypertension . Identify patient ran out of both metoprolol and furosemide prescriptions and did not have these refilled as no refills remaining on prescriptions o Counsel patient on importance of following up with providers regarding refills o Patient denies having monitored home BP recently. . Collaborate with office to request refills of metoprolol and furosemide . Encourage patient to monitor, keep log of BP and bring record to medical appointments   Patient Goals/Self-Care Activities . Over the next 90 days, patient will:  . To self administers medications as prescribed o Encourage patient to obtain weekly pillbox to use as adherence tool . Attends all scheduled provider appointments . Calls pharmacy for medication refills . Calls provider office for new concerns or questions  Follow Up Plan: Telephone follow up appointment with care management team member as previously scheduled for: 5/6 at 10 am      Patient's preferred pharmacy is:  [facility] [place], [address] [place] [address] Phone: [phone] Fax: [phone]  Uses pill box? Yes  Follow Up:  Patient  agrees to Care Plan and Follow-up.  [name], PharmD, [place], CPP Clinical Pharmacist [facility] [phone]

## 2020-05-26 NOTE — Telephone Encounter (Signed)
Refill was sent in 05/25/2020.  KP

## 2020-06-02 ENCOUNTER — Ambulatory Visit (INDEPENDENT_AMBULATORY_CARE_PROVIDER_SITE_OTHER): Payer: Medicare Other | Admitting: Pharmacist

## 2020-06-02 ENCOUNTER — Other Ambulatory Visit: Payer: Self-pay | Admitting: Family Medicine

## 2020-06-02 DIAGNOSIS — J449 Chronic obstructive pulmonary disease, unspecified: Secondary | ICD-10-CM

## 2020-06-02 DIAGNOSIS — I10 Essential (primary) hypertension: Secondary | ICD-10-CM

## 2020-06-02 DIAGNOSIS — F332 Major depressive disorder, recurrent severe without psychotic features: Secondary | ICD-10-CM

## 2020-06-02 MED ORDER — FLUOXETINE HCL 20 MG PO CAPS: 60.0000 mg | ORAL_CAPSULE | Freq: Every day | ORAL | 2 refills | Status: DC

## 2020-06-02 NOTE — Patient Instructions (Signed)
Visit Information  PATIENT GOALS: Goals Addressed            This Visit's Progress   . Pharmacy Goals       Our goal bad cholesterol, or LDL, is less than 70. This is why it is important to continue taking your atorvastatin  Please check your home blood pressure, keep a log of the results and bring this with you to your medical appointments.  Feel free to call me with any questions or concerns. I look forward to our next call!    [name], PharmD, [place] [phone]       The patient verbalized understanding of instructions, educational materials, and care plan provided today and declined offer to receive copy of patient instructions, educational materials, and care plan.   Telephone follow up appointment with care management team member scheduled for: 6/8 at 9:45 am

## 2020-06-02 NOTE — Chronic Care Management (AMB) (Signed)
Chronic Care Management Pharmacy Note  06/02/2020 Name:  Beth [name] MRN:  [id number] DOB:  1945-06-04  Subjective: Beth [name] is an 75 y.o. year old female who is a primary patient of Malfi, Lupita Raider, FNP.  The CCM team was consulted for assistance with disease management and care coordination needs.    Engaged with patient by telephone for follow up visit in response to provider referral for pharmacy case management and/or care coordination services.   Consent to Services:  The patient was given information about Chronic Care Management services, agreed to services, and gave verbal consent prior to initiation of services.  Please see initial visit note for detailed documentation.   Patient Care Team: Malfi, Lupita Raider, FNP as PCP - General (Family Medicine) Kate Sable, MD as PCP - Cardiology (Cardiology) [name], MD as Consulting Physician (Pain Medicine) Randel Pigg, MD as Referring Physician (Psychiatry) Avia Merkley, Virl Diamond, RPH-CPP (Pharmacist) Vanita Ingles, RN as Case Manager (General Practice) Greg Cutter, LCSW as Social Worker (Licensed Holiday representative)  Recent office visits: None  Hospital visits: None in previous 6 months  Objective:  Lab Results  Component Value Date   CREATININE 1.00 (H) 01/14/2020   CREATININE 1.21 (H) 08/17/2019   CREATININE 0.95 (H) 03/23/2019    Lab Results  Component Value Date   HGBA1C 5.3 06/26/2017   Last diabetic Eye exam: No results found for: HMDIABEYEEXA  Last diabetic Foot exam: No results found for: HMDIABFOOTEX    Social History   Tobacco Use  Smoking Status Current Every Day Smoker  . Packs/day: 0.25  . Years: 57.00  . Pack years: 14.25  . Types: Cigarettes  Smokeless Tobacco Never Used  Tobacco Comment   .5ppd currently   BP Readings from Last 3 Encounters:  04/19/20 (!) 123/59  04/03/20 95/75  03/27/20 128/61   Pulse Readings from Last 3 Encounters:  04/19/20  78  04/03/20 81  03/27/20 90   Wt Readings from Last 3 Encounters:  04/19/20 158 lb 12.8 oz (72 kg)  04/03/20 158 lb (71.7 kg)  03/27/20 160 lb 7.9 oz (72.8 kg)    Assessment: Review of patient past medical history, allergies, medications, health status, including review of consultants reports, laboratory and other test data, was performed as part of comprehensive evaluation and provision of chronic care management services.   SDOH:  (Social Determinants of Health) assessments and interventions performed: none   CCM Care Plan  Allergies  Allergen Reactions  . Bextra  [Valdecoxib]   . Compazine [Prochlorperazine Edisylate]     Stroke-like symptoms  . Lithium Carbonate     Leg weakness  . Lyrica [Pregabalin]     Medications Reviewed Today    Reviewed by Vella Raring, RPH-CPP (Pharmacist) on 05/25/20 at 1335  Med List Status: <None>  Medication Order Taking? Sig Documenting Provider Last Dose Status Informant  acetaminophen (TYLENOL) 500 MG tablet [id number] No Take 500 mg by mouth 2 (two) times daily as needed.   Patient not taking: Reported on 05/25/2020   [provider] Not Taking Active Pharmacy Records  Acetaminophen-Codeine 300-15 MG TABS [id number] Yes Take by mouth. 1 tablet every 4-6 hours as needed per dentist [provider] Taking Active   albuterol (PROVENTIL) (2.5 MG/3ML) 0.083% nebulizer solution 2.5 mg [id number]   Fredderick Severance, NP  Active   amoxicillin (AMOXIL) 875 MG tablet [id number] Yes Take 875 mg by mouth 2 (two) times daily. [provider]  Taking Active   aspirin 81 MG chewable tablet [id number] Yes Chew 81 mg daily by mouth. [provider] Taking Active Pharmacy Records           Med Note Francene Finders   Wed Dec 18, 2016  4:14 PM)    atorvastatin (LIPITOR) 40 MG tablet [id number] Yes TAKE 1 TABLET BY MOUTH EVERY DAY Malfi, Lupita Raider, FNP Taking Active   azelastine (ASTELIN) 0.1 % nasal spray [id number] Yes  Place 2 sprays into both nostrils 2 (two) times daily. Mikey College, NP Taking Active   clonazePAM Bobbye [place]) 0.5 MG tablet [id number] Yes Take one tablet 0.5mg  every other day as needed for anxiety [name], DO Taking Active   COMBIVENT RESPIMAT 20-100 MCG/ACT AERS respimat [id number] Yes INHALE 1 PUFF BY MOUTH EVERY 6 HOURS Karamalegos, [name], DO Taking Active   DULoxetine (CYMBALTA) 20 MG capsule [id number] Yes Take 1 capsule (20 mg total) by mouth daily. [name], DO Taking Active   FLUoxetine 60 MG TABS [id number] No Take 60 mg by mouth daily.  Patient not taking: Reported on 05/25/2020   [name], DO Not Taking Active   Fluticasone-Umeclidin-[facility] ELLIPTA) 200-62.5-25 MCG/INH AEPB [id number] Yes Inhale 1 puff into the lungs daily. Parrett, Fonnie Mu, NP Taking Active   furosemide (LASIX) 20 MG tablet [id number] No Take 1 tablet (20 mg total) by mouth daily.  Patient not taking: Reported on 05/25/2020   Kate Sable, MD Not Taking Active   gabapentin (NEURONTIN) 300 MG capsule [id number] Yes Take 2 capsules (600 mg total) by mouth 3 (three) times daily. [name], MD Taking Active            Med Note [facility]   Wed Feb 02, 2020  5:33 PM) Reports taking 2 capsules (600 mg) twice daily  hydrOXYzine (ATARAX/VISTARIL) 25 MG tablet [id number] No Take 25-50 mg by mouth daily as needed.  Patient not taking: Reported on 05/25/2020   [provider] Not Taking Active   Melatonin 3 MG CAPS [id number] Yes Take 10 mg by mouth at bedtime as needed (sleep).  [provider] Taking Active Self  metoprolol succinate (TOPROL-XL) 25 MG 24 hr tablet [id number] Yes TAKE 1/2 TABLET(12.5 MG) BY MOUTH DAILY Malfi, Lupita Raider, FNP Taking Active   montelukast (SINGULAIR) 10 MG tablet [id number] Yes Take 1 tablet (10 mg total) by mouth at bedtime. [name], DO Taking Active   QUEtiapine (SEROQUEL) 100 MG  tablet [id number] Yes TAKE 1 TABLET(100 MG) BY MOUTH AT BEDTIME [name], DO Taking Active   triamcinolone ointment (KENALOG) 0.5 % [id number] No Apply 1 application topically 2 (two) times daily.  Patient not taking: Reported on 05/25/2020   Verl Bangs, FNP Not Taking Active   Med List Note Rise Patience, RN 02/12/18 1055): Medication agreement signed 02/12/2018 UDS 02/24/17 03/25/17 PA requested for gabapentin sent SB           Patient Active Problem List   Diagnosis Date Noted  . Rash 01/14/2020  . Pulmonary hypertension ([place]) 09/14/2019  . Coccydynia 07/29/2019  . Cervicalgia 05/04/2019  . Acute pain of left shoulder 05/04/2019  . Elbow pain, left 05/04/2019  . DOE (dyspnea on exertion) 03/23/2019  . Leg swelling 03/22/2019    Class: Acute  . Lumbar degenerative disc disease 09/10/2018  . Left hip pain 09/10/2018  . Post-traumatic osteoarthritis of left elbow 07/14/2018  . Lumbar radiculopathy 02/12/2018  . Lumbar spondylosis 02/12/2018  .  Lumbar facet arthropathy 02/12/2018  . Atherosclerosis of aorta ([place]) 09/17/2017  . Lung nodule 09/17/2017  . Urinary tract infection 12/05/2016  . Protein-calorie malnutrition, mild ([place]) 08/07/2016  . Generalized anxiety disorder 07/15/2016  . Chronic pain syndrome 07/15/2016  . Severe episode of recurrent major depressive disorder, without psychotic features ([place]) 07/14/2016  . Moderate benzodiazepine use disorder ([place]) 05/08/2016  . Major depressive disorder, recurrent, severe w/o psychotic behavior ([place]) 05/01/2016  . Seasonal allergic rhinitis 03/23/2015  . Hyperglycemia 03/23/2015  . Senile purpura ([place]) 03/23/2015  . Perennial allergic rhinitis with seasonal variation 03/23/2015  . Marital problems 10/31/2014  . Migraine without aura and without status migrainosus, not intractable 10/31/2014  . Chronic bilateral low back pain without sciatica 08/09/2014  . Colon polyp 08/09/2014  . COPD, severe ([place])  08/09/2014  . CVA, old, hemiparesis ([place]) 08/09/2014  . Dyslipidemia 08/09/2014  . Dysfunction of eustachian tube 08/09/2014  . Fibromyalgia syndrome 08/09/2014  . Gastro-esophageal reflux disease without esophagitis 08/09/2014  . Benign migrating glossitis 08/09/2014  . Cerebrovascular accident, old 08/09/2014  . IBS (irritable bowel syndrome) 08/09/2014  . Low back pain with radiation 08/09/2014  . Chronic recurrent major depressive disorder ([place]) 08/09/2014  . Dysmetabolic syndrome [id number]  . OP (osteoporosis) 08/09/2014  . Vitamin D deficiency 08/09/2014  . Smoking 08/09/2014  . Benign hypertension 07/19/2013  . Benign neoplasm of skin of trunk 06/03/2013  . H/O: pneumonia 09/25/2012    Immunization History  Administered Date(s) Administered  . Influenza Split 11/13/2006, 11/02/2008  . Influenza, High Dose Seasonal PF 09/27/2014, 09/29/2015, 12/30/2016, 10/15/2018, 11/05/2019  . Influenza, Seasonal, Injecte, Preservative Fre 11/12/2010  . Influenza,inj,Quad PF,6+ Mos 10/08/2012  . Influenza-Unspecified 04/08/2014  . PFIZER(Purple Top)SARS-COV-2 Vaccination 03/14/2019, 04/09/2019  . PPD Test 05/16/2016  . Pneumococcal Conjugate-13 09/27/2014  . Pneumococcal Polysaccharide-23 06/12/2010  . Tdap 03/19/2007, 09/29/2015  . Zoster 04/16/2012    Conditions to be addressed/monitored: HTN, HLD and COPD  Care Plan : PharmD - Med Mgmt  Updates made by Vella Raring, RPH-CPP since 06/02/2020 12:00 AM    Problem: Disease Progression     Long-Range Goal: Disease Progression Prevented or Minimized   Start Date: 04/10/2020  Expected End Date: 07/09/2020  This Visit's Progress: On track  Recent Progress: On track  Priority: High  Note:   Current Barriers:  . Chronic Disease Management support, education, and care coordination needs related to previous CVA, COPD, hypertension, hyperlipidemia, depression  Pharmacist Clinical Goal(s):  Marland Kitchen Over the next 90 days, patient will  achieve adherence to monitoring guidelines and medication adherence to achieve therapeutic efficacy through collaboration with PharmD and provider.   Interventions: . 1:1 collaboration with [name], DO regarding development and update of comprehensive plan of care as evidenced by provider attestation and co-signature . Inter-disciplinary care team collaboration (see longitudinal plan of care) . Today patient reports she has completed 7-day course of amoxcillin as prescribed by dentist.  . Reports has appointment scheduled with [facility] for 5/16 regarding broken tooth  Moderate to Severe COPD:  . Patient followed by [facility] in [place] . Current treatment: o Trelegy 1 puff daily , rinse after use - Patient confirms rinsing after use.  o Combivent as needed . PFT completed on 09/09/2019 . Have counseled on benefits of smoking cessation  Hypertension . Reports taking: o Furosemide 20 mg daily o Metoprolol ER 25 mg - 1/2 tablet (12.5 mg ) daily . Note patient has home upper BP monitor . Reports last  check BP yesterday: 124/64, HR 62 . Reports continues to have some swelling in legs. Note LE edema previously accessed by Nephrology . Encourage patient to monitor, keep log of BP and bring record to upcoming appointment with Nephrology . Encourage patient to follow up with providers if has concerns about leg swelling or any new or worsening medical concerns  Coordination of Care:  . Today reports has not yet found new psychiatrist in-network with current insurance ([employer] Dual Complete plan) . Reports received list from [employer] for in-network psychiatrists in the area, but only received one name o Patient unable to recall practice/provider name during our call today, but states that she has the information written down.  o States she called and left a message with their office. Encouraged her to call back again today . Will  collaborate with CCM team  Patient Goals/Self-Care Activities . Over the next 90 days, patient will:  . To self administers medications as prescribed o Encourage patient to obtain weekly pillbox to use as adherence tool . Attends all scheduled provider appointments . Calls pharmacy for medication refills . Calls provider office for new concerns or questions  Follow Up Plan: Telephone follow up appointment with care management team member as previously scheduled for: 6/8 at 9:45 am      Patient's preferred pharmacy is:  [id number] [address], [address] AT [address] [place] [address] Phone: [phone] Fax: [phone]  Uses pill box? Yes  Follow Up:  Patient agrees to Care Plan and Follow-up.  [name], PharmD, Para [place], CPP Clinical Pharmacist [facility] [phone]

## 2020-06-08 ENCOUNTER — Ambulatory Visit: Payer: Self-pay | Admitting: General Practice

## 2020-06-08 ENCOUNTER — Telehealth: Payer: Self-pay | Admitting: General Practice

## 2020-06-08 DIAGNOSIS — F332 Major depressive disorder, recurrent severe without psychotic features: Secondary | ICD-10-CM

## 2020-06-08 DIAGNOSIS — I10 Essential (primary) hypertension: Secondary | ICD-10-CM | POA: Diagnosis not present

## 2020-06-08 DIAGNOSIS — J449 Chronic obstructive pulmonary disease, unspecified: Secondary | ICD-10-CM

## 2020-06-08 DIAGNOSIS — W19XXXA Unspecified fall, initial encounter: Secondary | ICD-10-CM

## 2020-06-08 DIAGNOSIS — F411 Generalized anxiety disorder: Secondary | ICD-10-CM

## 2020-06-08 NOTE — Chronic Care Management (AMB) (Signed)
Chronic Care Management   CCM RN Visit Note  06/08/2020 Name: Beth [name] MRN: [id number] DOB: [birth year]  Subjective: Beth [name] is a 75 y.o. year old female who is a primary care patient of Lorine Bears, Lupita Raider, FNP. The care management team was consulted for assistance with disease management and care coordination needs.    Engaged with patient by telephone for follow up visit in response to provider referral for case management and/or care coordination services.   Consent to Services:  The patient was given information about Chronic Care Management services, agreed to services, and gave verbal consent prior to initiation of services.  Please see initial visit note for detailed documentation.   Patient agreed to services and verbal consent obtained.   Assessment: Review of patient past medical history, allergies, medications, health status, including review of consultants reports, laboratory and other test data, was performed as part of comprehensive evaluation and provision of chronic care management services.   SDOH (Social Determinants of Health) assessments and interventions performed:    CCM Care Plan  Allergies  Allergen Reactions  . Bextra  [Valdecoxib]   . Compazine [Prochlorperazine Edisylate]     Stroke-like symptoms  . Lithium Carbonate     Leg weakness  . Lyrica [Pregabalin]     Outpatient Encounter Medications as of 06/08/2020  Medication Sig Note  . acetaminophen (TYLENOL) 500 MG tablet Take 500 mg by mouth 2 (two) times daily as needed.  (Patient not taking: Reported on 05/25/2020)   . Acetaminophen-Codeine 300-15 MG TABS Take by mouth. 1 tablet every 4-6 hours as needed per dentist   . aspirin 81 MG chewable tablet Chew 81 mg daily by mouth.   Marland Kitchen atorvastatin (LIPITOR) 40 MG tablet TAKE 1 TABLET BY MOUTH EVERY DAY   . azelastine (ASTELIN) 0.1 % nasal spray Place 2 sprays into both nostrils 2 (two) times daily.   . clonazePAM (KLONOPIN) 0.5 MG tablet Take  one tablet 0.5mg  every other day as needed for anxiety   . COMBIVENT RESPIMAT 20-100 MCG/ACT AERS respimat INHALE 1 PUFF BY MOUTH EVERY 6 HOURS   . DULoxetine (CYMBALTA) 20 MG capsule Take 1 capsule (20 mg total) by mouth daily.   Marland Kitchen FLUoxetine (PROZAC) 20 MG capsule Take 3 capsules (60 mg total) by mouth daily.   . Fluticasone-Umeclidin-Vilant (TRELEGY ELLIPTA) 200-62.5-25 MCG/INH AEPB Inhale 1 puff into the lungs daily.   . furosemide (LASIX) 20 MG tablet Take 1 tablet (20 mg total) by mouth daily.   Marland Kitchen gabapentin (NEURONTIN) 300 MG capsule Take 2 capsules (600 mg total) by mouth 3 (three) times daily. 02/02/2020: Reports taking 2 capsules (600 mg) twice daily  . hydrOXYzine (ATARAX/VISTARIL) 25 MG tablet Take 25-50 mg by mouth daily as needed. (Patient not taking: Reported on 05/25/2020)   . Melatonin 3 MG CAPS Take 10 mg by mouth at bedtime as needed (sleep).    . metoprolol succinate (TOPROL-XL) 25 MG 24 hr tablet TAKE 1/2 TABLET(12.5 MG) BY MOUTH DAILY   . montelukast (SINGULAIR) 10 MG tablet Take 1 tablet (10 mg total) by mouth at bedtime.   Marland Kitchen QUEtiapine (SEROQUEL) 100 MG tablet TAKE 1 TABLET(100 MG) BY MOUTH AT BEDTIME    Facility-Administered Encounter Medications as of 06/08/2020  Medication  . albuterol (PROVENTIL) (2.5 MG/3ML) 0.083% nebulizer solution 2.5 mg    Patient Active Problem List   Diagnosis Date Noted  . Rash 01/14/2020  . Pulmonary hypertension ([place]) 09/14/2019  . Coccydynia 07/29/2019  . Cervicalgia 05/04/2019  .  Acute pain of left shoulder 05/04/2019  . Elbow pain, left 05/04/2019  . DOE (dyspnea on exertion) 03/23/2019  . Leg swelling 03/22/2019    Class: Acute  . Lumbar degenerative disc disease 09/10/2018  . Left hip pain 09/10/2018  . Post-traumatic osteoarthritis of left elbow 07/14/2018  . Lumbar radiculopathy 02/12/2018  . Lumbar spondylosis 02/12/2018  . Lumbar facet arthropathy 02/12/2018  . Atherosclerosis of aorta ([place]) 09/17/2017  . Lung nodule  09/17/2017  . Urinary tract infection 12/05/2016  . Protein-calorie malnutrition, mild ([place]) 08/07/2016  . Generalized anxiety disorder 07/15/2016  . Chronic pain syndrome 07/15/2016  . Severe episode of recurrent major depressive disorder, without psychotic features ([place]) 07/14/2016  . Moderate benzodiazepine use disorder ([place]) 05/08/2016  . Major depressive disorder, recurrent, severe w/o psychotic behavior ([place]) 05/01/2016  . Seasonal allergic rhinitis 03/23/2015  . Hyperglycemia 03/23/2015  . Senile purpura ([place]) 03/23/2015  . Perennial allergic rhinitis with seasonal variation 03/23/2015  . Marital problems 10/31/2014  . Migraine without aura and without status migrainosus, not intractable 10/31/2014  . Chronic bilateral low back pain without sciatica 08/09/2014  . Colon polyp 08/09/2014  . COPD, severe ([place]) 08/09/2014  . CVA, old, hemiparesis ([place]) 08/09/2014  . Dyslipidemia 08/09/2014  . Dysfunction of eustachian tube 08/09/2014  . Fibromyalgia syndrome 08/09/2014  . Gastro-esophageal reflux disease without esophagitis 08/09/2014  . Benign migrating glossitis 08/09/2014  . Cerebrovascular accident, old 08/09/2014  . IBS (irritable bowel syndrome) 08/09/2014  . Low back pain with radiation 08/09/2014  . Chronic recurrent major depressive disorder ([place]) 08/09/2014  . Dysmetabolic syndrome 27/25/3664  . OP (osteoporosis) 08/09/2014  . Vitamin D deficiency 08/09/2014  . Smoking 08/09/2014  . Benign hypertension 07/19/2013  . Benign neoplasm of skin of trunk 06/03/2013  . H/O: pneumonia 09/25/2012    Conditions to be addressed/monitored:HTN, COPD, Anxiety, Depression and Fall prevention and Safety  Care Plan : RNCM: HLD Management  Updates made by Vanita Ingles since 06/08/2020 12:00 AM    Problem: RNCM: HLD Management   Priority: Medium    Long-Range Goal: RNCM: HLD Mangement   Priority: Medium  Note:   Current Barriers:  . Poorly controlled hyperlipidemia,  complicated by COPD, smoker . Current antihyperlipidemic regimen: Lipitor 40 mg QD . Most recent lipid panel:     Component Value Date/Time   CHOL 135 04/10/2018 1115   CHOL 118 03/23/2015 1536   CHOL 185 12/25/2012 1449   TRIG 50 04/10/2018 1115   TRIG 93 12/25/2012 1449   HDL 59 04/10/2018 1115   HDL 44 03/23/2015 1536   HDL 39 (L) 12/25/2012 1449   CHOLHDL 2.3 04/10/2018 1115   VLDL 18 03/21/2016 0936   VLDL 19 12/25/2012 1449   LDLCALC 64 04/10/2018 1115   LDLCALC 127 (H) 12/25/2012 1449 .   Marland Kitchen ASCVD risk enhancing conditions: age >25,  HTN,  current smoker . Lacks social connections . Does not maintain contact with provider office . Does not contact provider office for questions/concerns RN Care Manager Clinical Goal(s):  . patient will work with Consulting civil engineer, providers, and care team towards execution of optimized self-health management plan . patient will verbalize understanding of plan for effective management of HLD  . patient will work with [facility] and pcp  to address needs related to management of HLD Interventions: . Collaboration with Malfi, Lupita Raider, FNP regarding development and update of comprehensive plan of care as evidenced by provider attestation and co-signature . Inter-disciplinary care team collaboration (see longitudinal  plan of care) . Medication review performed; medication list updated in electronic medical record.  Bertram Savin care team collaboration (see longitudinal plan of care) . Referred to pharmacy team for assistance with HLD medication management . Evaluation of current treatment plan related to HLD and patient's adherence to plan as established by provider. 06-08-2020: The patient states she is compliant with medications and plan of care. Denies any new concerns related to HLD  . Advised patient to call the office for changes in condition or questions  . Provided education to patient re: heart healthy diet, medication compliance, and  working with CCM team to manage health and well being.  . Discussed plans with patient for ongoing care management follow up and provided patient with direct contact information for care management team Patient Goals/Self-Care Activities: - call for medicine refill 2 or 3 days before it runs out - call if I am sick and can't take my medicine - keep a list of all the medicines I take; vitamins and herbals too - learn to read medicine labels - use a pillbox to sort medicine - use an alarm clock or phone to remind me to take my medicine - change to whole grain breads, cereal, pasta - drink 6 to 8 glasses of water each day - eat 3 to 5 servings of fruits and vegetables each day - eat 5 or 6 small meals each day - fill half the plate with nonstarchy vegetables - limit fast food meals to no more than 1 per week - manage portion size - prepare main meal at home 3 to 5 days each week - read food labels for fat, fiber, carbohydrates and portion size - be open to making changes - I can manage, know and watch for signs of a heart attack - if I have chest pain, call for help - learn about small changes that will make a big difference - learn my personal risk factors - barriers to meeting goals identified - change-talk evoked - choices provided - collaboration with team encouraged - decision-making supported - health risks reviewed - problem-solving facilitated - questions answered - readiness for change evaluated - reassurance provided - resources needed to meet goals identified - self-reflection promoted - self-reliance encouraged - verbalization of feelings encouraged  Follow Up Plan: Telephone follow up appointment with care management team member scheduled for:08-10-2020 at 230 pm     Care Plan : RNCM: Depression (Adult) and Anxiety  Updates made by Vanita Ingles since 06/08/2020 12:00 AM    Problem: RNCM: Depression Identification (Depression) and anxiety   Priority: Medium     Long-Range Goal: RNCM: Depressive Symptoms Identified and Anxiety   Priority: Medium  Note:   Current Barriers:  Marland Kitchen Knowledge Deficits related to resources to help with finding a new psychiatrist in the are to help with effective management of depression and anxiety  . Chronic Disease Management support and education needs related to effective management of depression and anxiety  . Lacks caregiver support.  . Difficulty obtaining medications . Unable to independently manage depression and anxiety as evidence of the need for a new psychiatrist in [place]  . Unable to self administer medications as prescribed . Lacks social connections . Does not maintain contact with provider office . Does not contact provider office for questions/concerns . Unable to secure a new psychiatrist  Nurse Case Manager Clinical Goal(s):  . patient will verbalize understanding of plan for effective management of depression and anxiety  . patient  will work with RNCM, pcp, and CCM team  to address needs related to depression ad anxiety  . patient will demonstrate improved adherence to prescribed treatment plan for depression and anxiety  as evidenced byno exacerbations in depression/anxiety or changes in mood  Interventions:  . 1:1 collaboration with Malfi, Lupita Raider, FNP regarding development and update of comprehensive plan of care as evidenced by provider attestation and co-signature . Inter-disciplinary care team collaboration (see longitudinal plan of care) . Evaluation of current treatment plan related to depression and anxiety  and patient's adherence to plan as established by provider. 06-08-2020: Saw the pcp in March. Medications refilled at that time. The patient states that she has not found a new psychiatrist yet. She has called her insurance and left messages, but has not received returned calls. She states she will need refills soon. Education on contacting the providers on the list she was  provided by her insurance company. Also advised the patient to get a follow up visit with pcp for recommendations and evaluation.  . Advised patient to work with CCM team to meet depression and anxiety needs. Has talked with LCSW recently. Discussed calling the office for changes in mood, anxiety, or depression. 06-08-2020: Will collaborate with the CCM team and pcp on best course of action to assist with finding needed psychiatric support for the patient.  The patient denies any new concerns with her depression or anxiety but would like to find a new psychiatrist to manage her care. She states most doctors do not want to treat patients over the age of 44. Marland Kitchen Provided education to patient re: effective ways to manage depression and anxiety  . Reviewed medications with patient and discussed compliance. States she is in need of some refills . Collaborated with RNCM, pcp, and CCM team  regarding patient saying she is out of clonazepam and montelukast and her psychiatrist in [place] says she is too far and will not see her or refill medications. The patient is receptive to working with CCM team to get a new psychiatrist. 06-08-2020: Is currently still looking for psychiatrist.  . Social Work referral for help with new psychiatrist. 06-08-2020: Will reach out to the LCSW for assistance.  . Pharmacy referral for medication management  . Discussed plans with patient for ongoing care management follow up and provided patient with direct contact information for care management team  Patient Goals/Self-Care Activities Over the next 120 days, patient will:  - Patient will self administer medications as prescribed Patient will attend all scheduled provider appointments Patient will call pharmacy for medication refills Patient will attend church or other social activities Patient will continue to perform ADL's independently Patient will continue to perform IADL's independently Patient will call provider office for  new concerns or questions Patient will work with BSW to address care coordination needs and will continue to work with the clinical team to address health care and disease management related needs.    - anxiety screen reviewed - depression screen reviewed - medication list reviewed - participation in psychiatric services encouraged Follow Up Plan: Telephone follow up appointment with care management team member scheduled for:08-10-2020 at 230 pm       Care Plan : RNCM: COPD (Adult)  Updates made by Vanita Ingles since 06/08/2020 12:00 AM    Problem: RNCM; Psychological Adjustment to Diagnosis (COPD)   Priority: Medium    Long-Range Goal: RNCM:  Adjustment to Disease Achieved   Priority: Medium  Note:   Current Barriers:  .  Knowledge deficits related to basic understanding of COPD disease process . Knowledge deficits related to basic COPD self care/management . Knowledge deficit related to basic understanding of how to use inhalers and how inhaled medications work . Knowledge deficit related to importance of energy conservation . Lacks social connections . Does not maintain contact with provider office . Does not contact provider office for questions/concerns  Case Manager Clinical Goal(s):  patient will report using inhalers as prescribed including rinsing mouth after use  patient will report utilizing pursed lip breathing for shortness of breath  patient will verbalize understanding of COPD action plan and when to seek appropriate levels of medical care  patient will engage in lite exercise as tolerated to build/regain stamina and strength and reduce shortness of breath through activity tolerance  patient will verbalize basic understanding of COPD disease process and self care activities  patient will not be hospitalized for COPD exacerbation as evidenced  Interventions:  . Collaboration with Malfi, Lupita Raider, FNP regarding development and update of comprehensive plan of  care as evidenced by provider attestation and co-signature . Inter-disciplinary care team collaboration (see longitudinal plan of care)  Provided patient with basic written and verbal COPD education on self care/management/and exacerbation prevention   Provided patient with COPD action plan and reinforced importance of daily self assessment  Provided written and verbal instructions on pursed lip breathing and utilized returned demonstration as teach back  Provided instruction about proper use of medications used for management of COPD including inhalers  Advised patient to self assesses COPD action plan zone and make appointment with provider if in the yellow zone for 48 hours without improvement.  Provided patient with education about the role of exercise in the management of COPD  Advised patient to engage in light exercise as tolerated 3-5 days a week  Provided education about and advised patient to utilize infection prevention strategies to reduce risk of respiratory infection  06-08-2020: Review of COPD status. The patient has been taking ABX but for a broken tooth that will be removed on 06-12-2020. She denies any issues with her COPD at this time.  Patient Goals/Self-Care Activities:  . - counseling provided . - decision-making supported . - depression screen reviewed . - emotional support provided . - family involvement promoted . - problem-solving facilitated . - relaxation techniques promoted . - verbalization of feelings encouraged Follow Up Plan: Telephone follow up appointment with care management team member scheduled for: 08-10-2020 AT 27 PM   Care Plan : RNCM: Fall Risk (Adult)  Updates made by Vanita Ingles since 06/08/2020 12:00 AM    Problem: RNCM: Fall Risk   Priority: High    Goal: RNCM: Absence of Fall and Fall-Related Injury   Priority: High  Note:   Current Barriers:  Marland Kitchen Knowledge Deficits related to fall precautions in patient with multiple chronic  conditions.  . Decreased adherence to prescribed treatment for fall prevention . Unable to independently manage falls  . Lacks social connections . Does not contact provider office for questions/concerns . Knowledge Deficits related to resources to help with making home safe  . Chronic Disease Management support and education needs related to falls prevention and safety  . Lacks caregiver support.  Clinical Goal(s):  . patient will demonstrate improved adherence to prescribed treatment plan for decreasing falls as evidenced by patient reporting and review of EMR . patient will verbalize using fall risk reduction strategies discussed . patient will not experience additional falls . patient will verbalize  understanding of plan for remaining safe in home environment and fall prevention plan. Interventions:  . Collaboration with Malfi, Lupita Raider, FNP regarding development and update of comprehensive plan of care as evidenced by provider attestation and co-signature . Inter-disciplinary care team collaboration (see longitudinal plan of care) . Provided written and verbal education re: Potential causes of falls and Fall prevention strategies . Reviewed medications and discussed potential side effects of medications such as dizziness and frequent urination . Assessed for s/s of orthostatic hypotension. Review and education today  . Assessed for falls since last encounter. Patient had a fall last week, today is 04-06-2020. 06-08-2020: Denies any new falls at this time. Will continue to monitor.  . Assessed patients knowledge of fall risk prevention secondary to previously provided education. . Assessed working status of life alert bracelet and patient adherence . Provided patient information for fall alert systems . Evaluation of current treatment plan related to falls prevention and safety  and patient's adherence to plan as established by provider. 06-08-2020: Denies any new falls at this time. Will  continue to monitor.  . Advised patient to call the office for new falls or injuries  . Provided education to patient re: changing position slowly, reporting increased light headedness or dizziness, and being safe in home environment  . Discussed plans with patient for ongoing care management follow up and provided patient with direct contact information for care management team Self-Care Deficits:  Unable to independently prevent falls in her home environment  Lacks social connections Does not maintain contact with provider office Does not contact provider office for questions/concerns Patient Goals:  [place] safety precautions appropriately with all ambulation. Does not use a cane or a walker  - De-clutter walkways - Change positions slowly - Wear secure fitting shoes at all times with ambulation - Utilize home lighting for dim lit areas - Demonstrate self and pet awareness at all times - activities of daily living skills assessed - barriers to physical activity or exercise addressed - barriers to physical activity or exercise identified - barriers to safety identified - cognition assessed - cognitive-stimulating activities promoted - fall prevention plan reviewed and updated - fear of falling, loss of independence and pain acknowledged - medication list reviewed - modification of home and work environment promoted - vision and/or hearing aid use promoted Follow Up Plan: Telephone follow up appointment with care management team member scheduled for: 08-10-2020 at 230 pm     Plan:Telephone follow up appointment with care management team member scheduled for:  08-10-2020 at 230 pm  [place], MSN, [place] Mobile: [phone]

## 2020-06-08 NOTE — Patient Instructions (Signed)
Visit Information  PATIENT GOALS: Goals Addressed            This Visit's Progress   . RNCM: Prevent Falls and Injury       Follow Up Date 08-10-2020   - add more outdoor lighting - always use handrails on the stairs - always wear low-heeled or flat shoes or slippers with nonskid soles - call the doctor if I am feeling too drowsy - install bathroom grab bars - keep a flashlight by the bed - keep my cell phone with me always - learn how to get back up if I fall - make an emergency alert plan in case I fall - pick up clutter from the floors - use a nonslip pad with throw rugs, or remove them completely - use a nightlight in the bathroom    Why is this important?    Most falls happen when it is hard for you to walk safely. Your balance may be off because of an illness. You may have pain in your knees, hip or other joints.   You may be overly tired or taking medicines that make you sleepy. You may not be able to see or hear clearly.   Falls can lead to broken bones, bruises or other injuries.   There are things you can do to help prevent falling.     Notes: Patient states last fall was last week, today is 04-06-2020. Denies injuries. 06-08-2020: Denies falls since the last outreach. Will continue to monitor.        The patient verbalized understanding of instructions, educational materials, and care plan provided today and declined offer to receive copy of patient instructions, educational materials, and care plan.   Telephone follow up appointment with care management team member scheduled for: 08-10-2020 at 230 pm  [name] RN, MSN, [place] Mobile: [phone]

## 2020-06-13 ENCOUNTER — Telehealth: Payer: Self-pay | Admitting: Licensed Clinical Social Worker

## 2020-06-13 NOTE — Telephone Encounter (Signed)
    Clinical Social Work  Chronic Care Management   Phone Outreach    06/13/2020 Name: Beth [name] MRN: [id number] DOB: 1945-05-05  Beth [name] is a 74 y.o. year old female who is a primary care patient of Lorine Bears, Lupita Raider, FNP .   CCM LCSW reached out to patient today by phone to introduce self, assess needs and offer Care Management services and interventions.    A HIPPA compliant phone message was left for the patient providing contact information and requesting a return call.  2nd unsuccessful telephone outreach attempt.  If unable to reach patient by phone on the 3rd attempt, will discontinue outreach calls but will be available at any time to provide services.   Plan:Will route chart to Care Guide to [name] if patient would like to reschedule phone appointment   Review of patient status, including review of consultants reports, relevant laboratory and other test results, and collaboration with appropriate care team members and the patient's provider was performed as part of comprehensive patient evaluation and provision of care management services.    Beth [name], MSW, [facility] Care Management [email or web address] Phone [phone] 4:11 PM

## 2020-06-14 ENCOUNTER — Telehealth: Payer: Self-pay

## 2020-06-14 NOTE — Telephone Encounter (Signed)
Left message for patient to call back and schedule her 6 month follow up lung cancer screening CT scan.

## 2020-06-15 ENCOUNTER — Other Ambulatory Visit: Payer: Self-pay | Admitting: Family Medicine

## 2020-06-15 DIAGNOSIS — F332 Major depressive disorder, recurrent severe without psychotic features: Secondary | ICD-10-CM

## 2020-06-16 ENCOUNTER — Telehealth: Payer: Self-pay

## 2020-06-16 NOTE — Chronic Care Management (AMB) (Signed)
  Care Management   Note  06/16/2020 Name: Beth [name] MRN: [id number] DOB: 1945-02-18  Beth [name] is a 75 y.o. year old female who is a primary care patient of Lorine Bears, Lupita Raider, FNP and is actively engaged with the care management team. I reached out to [name] by phone today to assist with re-scheduling a follow up visit with the Licensed Clinical Social Worker  Follow up plan: Unsuccessful telephone outreach attempt made. A HIPAA compliant phone message was left for the patient providing contact information and requesting a return call.  The care management team will reach out to the patient again over the next 7 days.  If patient returns call to provider office, please advise to call [facility]  at [place], [address], [place], [place] [address] Direct Dial: [phone] [email or web address]: [email or web address]

## 2020-06-21 ENCOUNTER — Telehealth: Payer: Self-pay | Admitting: Licensed Clinical Social Worker

## 2020-06-21 NOTE — Telephone Encounter (Signed)
    Clinical Social Work  Chronic Care Management   Phone Outreach    06/21/2020 Name: Beth [name] MRN: [id number] DOB: May 26, 1945  Beth [name] is a 75 y.o. year old female who is a primary care patient of Lorine Bears, Lupita Raider, FNP .   CCM LCSW reached out to patient today by phone to introduce self, assess needs and offer Care Management services and interventions.    Telephone outreach was unsuccessful  Plan:Will route chart to Care Guide to see if patient would like to schedule phone appointment   Review of patient status, including review of consultants reports, relevant laboratory and other test results, and collaboration with appropriate care team members and the patient's provider was performed as part of comprehensive patient evaluation and provision of care management services.     Christa See, MSW, [facility] Care Management [email or web address] Phone [phone] 2:50 PM

## 2020-06-27 NOTE — Chronic Care Management (AMB) (Signed)
  Care Management   Note  06/27/2020 Name: Beth [name] MRN: [id number] DOB: [birth year]  Beth [name] is a 75 y.o. year old female who is a primary care patient of Lorine Bears, Lupita Raider, FNP and is actively engaged with the care management team. I reached out to [name] by phone today to assist with re-scheduling a follow up visit with the Licensed Clinical Social Worker  Follow up plan: Unsuccessful telephone outreach attempt made. A HIPAA compliant phone message was left for the patient providing contact information and requesting a return call.  The care management team will reach out to the patient again over the next 7 days.  If patient returns call to provider office, please advise to call [facility]  at [place], [address], [place], [place] [address] Direct Dial: [phone] [email or web address] Website: [email or web address]

## 2020-06-30 NOTE — Chronic Care Management (AMB) (Signed)
  Care Management   Note  06/30/2020 Name: Beth [name] MRN: [id number] DOB: 1945/09/22  [name] is a 75 y.o. year old female who is a primary care patient of Lorine Bears, Lupita Raider, FNP and is actively engaged with the care management team. I reached out to [name] by phone today to assist with re-scheduling a follow up visit with the Licensed Clinical Social Worker  Follow up plan: Unable to make contact on outreach attempts x 3. PCP Malfi, Lupita Raider, FNP notified via routed documentation in medical record.   [name], [place], [place], [place] [address] Direct Dial: [phone] [email or web address]: [email or web address]

## 2020-06-30 NOTE — Telephone Encounter (Signed)
3rd unsuccessful outreach  

## 2020-07-03 ENCOUNTER — Ambulatory Visit (INDEPENDENT_AMBULATORY_CARE_PROVIDER_SITE_OTHER): Payer: Medicare Other | Admitting: Licensed Clinical Social Worker

## 2020-07-03 DIAGNOSIS — I10 Essential (primary) hypertension: Secondary | ICD-10-CM

## 2020-07-03 DIAGNOSIS — F332 Major depressive disorder, recurrent severe without psychotic features: Secondary | ICD-10-CM

## 2020-07-03 DIAGNOSIS — F339 Major depressive disorder, recurrent, unspecified: Secondary | ICD-10-CM

## 2020-07-03 DIAGNOSIS — J449 Chronic obstructive pulmonary disease, unspecified: Secondary | ICD-10-CM | POA: Diagnosis not present

## 2020-07-03 NOTE — Chronic Care Management (AMB) (Signed)
    Clinical Social Work  Chronic Care Management   Phone Outreach    07/03/2020 Name: LORRENA GORANSON MRN: [id number] DOB: 02/21/1945  Cresenciano Lick Heinle is a 75 y.o. year old female who is a primary care patient of Malfi, Lupita Raider, FNP .   3rd unsuccessful telephone outreach was attempted today. The patient was referred to the case management team for assistance with care management and care coordination. The patient's primary care provider has been notified of our unsuccessful attempts to make or maintain contact with the patient. The care management team is pleased to engage with this patient at any time in the future should he/she be interested in assistance from the care management team.   Plan: CCM LCSW will disconnect from the care team. Patient will continue to work with CCM RN and CCM Pharmacist. The team will consult with LCSW, if needs arise  Review of patient status, including review of consultants reports, relevant laboratory and other test results, and collaboration with appropriate care team members and the patient's provider was performed as part of comprehensive patient evaluation and provision of care management services.    Christa See, MSW, [facility] Care Management [email or web address] Phone [phone] 10:55 AM

## 2020-07-04 ENCOUNTER — Other Ambulatory Visit: Payer: Self-pay | Admitting: Family Medicine

## 2020-07-04 DIAGNOSIS — F5104 Psychophysiologic insomnia: Secondary | ICD-10-CM

## 2020-07-04 DIAGNOSIS — F411 Generalized anxiety disorder: Secondary | ICD-10-CM

## 2020-07-04 NOTE — Telephone Encounter (Signed)
Requested medication (s) are due for refill today: yes  Requested medication (s) are on the active medication list: yes  Last refill:  04/19/20 #15 1 refill  Future visit scheduled: no   Notes to clinic:  not delegated per protocol     Requested Prescriptions  Pending Prescriptions Disp Refills   clonazePAM (KLONOPIN) 0.5 MG tablet [Pharmacy Med Name: CLONAZEPAM 0.5MG  TABLETS] 15 tablet     Sig: TAKE 1 TABLET(0.5 MG) BY MOUTH EVERY OTHER DAY AS NEEDED FOR ANXIETY      Not Delegated - Psychiatry:  Anxiolytics/Hypnotics Failed - 07/04/2020 12:40 PM      Failed - This refill cannot be delegated      Failed - Urine Drug Screen completed in last 360 days      Passed - Valid encounter within last 6 months    Recent Outpatient Visits           2 months ago Major depressive disorder, recurrent, severe w/o psychotic behavior ([place])   [place], DO   5 months ago [facility] Malfi, Lupita Raider, FNP   12 months ago Benign hypertension   [facility], Lupita Raider, FNP   1 year ago Leg swelling   [facility], Lupita Raider, FNP   1 year ago Leg swelling   [facility], Lupita Raider, Drain

## 2020-07-05 ENCOUNTER — Ambulatory Visit: Payer: Self-pay | Admitting: Pharmacist

## 2020-07-05 ENCOUNTER — Ambulatory Visit
Admission: RE | Admit: 2020-07-05 | Discharge: 2020-07-05 | Disposition: A | Discharge status: Home / Self Care | Payer: Medicare Other | Source: Ambulatory Visit | Attending: Oncology | Admitting: Oncology

## 2020-07-05 ENCOUNTER — Other Ambulatory Visit: Payer: Self-pay

## 2020-07-05 ENCOUNTER — Telehealth: Payer: Self-pay | Admitting: Pulmonary Disease

## 2020-07-05 DIAGNOSIS — R918 Other nonspecific abnormal finding of lung field: Secondary | ICD-10-CM | POA: Diagnosis present

## 2020-07-05 DIAGNOSIS — F17200 Nicotine dependence, unspecified, uncomplicated: Secondary | ICD-10-CM

## 2020-07-05 DIAGNOSIS — Z87891 Personal history of nicotine dependence: Secondary | ICD-10-CM | POA: Insufficient documentation

## 2020-07-05 DIAGNOSIS — I10 Essential (primary) hypertension: Secondary | ICD-10-CM

## 2020-07-05 DIAGNOSIS — J449 Chronic obstructive pulmonary disease, unspecified: Secondary | ICD-10-CM

## 2020-07-05 DIAGNOSIS — E785 Hyperlipidemia, unspecified: Secondary | ICD-10-CM

## 2020-07-05 MED ORDER — PREDNISONE 10 MG (21) PO TBPK: ORAL_TABLET | ORAL | 0 refills | Status: DC

## 2020-07-05 MED ORDER — ATORVASTATIN CALCIUM 40 MG PO TABS: ORAL_TABLET | ORAL | 1 refills | Status: DC

## 2020-07-05 MED ORDER — AZITHROMYCIN 250 MG PO TABS: ORAL_TABLET | ORAL | 0 refills | Status: AC

## 2020-07-05 NOTE — Telephone Encounter (Signed)
Spoke to patient, who reports of chest congestion, prod cough with green to yellow sputum and wheezing x1w. Sob is baseline. Denied fever, chills or sweats using trelegy daily. She does not have albuterol neb or combivent.  Fully vaccinated against covid and flu.   Dr. Patsey Berthold, please advise. Thanks

## 2020-07-05 NOTE — Chronic Care Management (AMB) (Signed)
Chronic Care Management Pharmacy Note  07/05/2020 Name:  Beth [name] MRN:  [id number] DOB:  May 05, 1945   Subjective: Beth [name] is an 75 y.o. year old female who is a primary patient of Malfi, Lupita Raider, FNP.  The CCM team was consulted for assistance with disease management and care coordination needs.    Engaged with patient by telephone for follow up visit in response to provider referral for pharmacy case management and/or care coordination services.   Consent to Services:  The patient was given information about Chronic Care Management services, agreed to services, and gave verbal consent prior to initiation of services.  Please see initial visit note for detailed documentation.   Patient Care Team: Malfi, Lupita Raider, FNP as PCP - General (Family Medicine) Kate Sable, MD as PCP - Cardiology (Cardiology) [name], MD as Consulting Physician (Pain Medicine) Randel Pigg, MD as Referring Physician (Psychiatry) Jenee Spaugh, Virl Diamond, RPH-CPP (Pharmacist) Vanita Ingles, RN as Case Manager (General Practice)  Recent office visits: None  Hospital visits: None in previous 6 months  Objective:  Lab Results  Component Value Date   CREATININE 1.00 (H) 01/14/2020   CREATININE 1.21 (H) 08/17/2019   CREATININE 0.95 (H) 03/23/2019     Social History   Tobacco Use  Smoking Status Current Every Day Smoker  . Packs/day: 0.25  . Years: 57.00  . Pack years: 14.25  . Types: Cigarettes  Smokeless Tobacco Never Used  Tobacco Comment   .5ppd currently   BP Readings from Last 3 Encounters:  04/19/20 (!) 123/59  04/03/20 95/75  03/27/20 128/61   Pulse Readings from Last 3 Encounters:  04/19/20 78  04/03/20 81  03/27/20 90   Wt Readings from Last 3 Encounters:  04/19/20 158 lb 12.8 oz (72 kg)  04/03/20 158 lb (71.7 kg)  03/27/20 160 lb 7.9 oz (72.8 kg)    Assessment: Review of patient past medical history, allergies, medications, health  status, including review of consultants reports, laboratory and other test data, was performed as part of comprehensive evaluation and provision of chronic care management services.   SDOH:  (Social Determinants of Health) assessments and interventions performed: yes SDOH Interventions   Flowsheet Row Most Recent Value  SDOH Interventions   SDOH Interventions for the Following Domains Tobacco  Tobacco Interventions Other (Comment)  [Encourage smoking cessation]      CCM Care Plan  Allergies  Allergen Reactions  . Bextra  [Valdecoxib]   . Compazine [Prochlorperazine Edisylate]     Stroke-like symptoms  . Lithium Carbonate     Leg weakness  . Lyrica [Pregabalin]     Medications Reviewed Today    Reviewed by Vella Raring, RPH-CPP (Pharmacist) on 07/05/20 at 1008  Med List Status: <None>  Medication Order Taking? Sig Documenting Provider Last Dose Status Informant  acetaminophen (TYLENOL) 500 MG tablet [id number]  Take 500 mg by mouth 2 (two) times daily as needed.   Patient not taking: Reported on 05/25/2020   [provider]  Active Pharmacy Records  Acetaminophen-Codeine 300-15 MG TABS [id number]  Take by mouth. 1 tablet every 4-6 hours as needed per dentist [provider]  Active   albuterol (PROVENTIL) (2.5 MG/3ML) 0.083% nebulizer solution 2.5 mg [id number]   Fredderick Severance, NP  Active   aspirin 81 MG chewable tablet [id number]  Chew 81 mg daily by mouth. [provider]  Active Pharmacy Records           Med  Note Dell Ponto Dec 18, 2016  4:14 PM)    atorvastatin (LIPITOR) 40 MG tablet [id number]  TAKE 1 TABLET BY MOUTH EVERY DAY Malfi, Lupita Raider, FNP  Active   azelastine (ASTELIN) 0.1 % nasal spray [id number]  Place 2 sprays into both nostrils 2 (two) times daily. Mikey College, NP  Active   clonazePAM (KLONOPIN) 0.5 MG tablet [id number]  TAKE 1 TABLET(0.5 MG) BY MOUTH EVERY OTHER DAY AS NEEDED FOR ANXIETY Jearld Fenton, NP   Active   COMBIVENT RESPIMAT 20-100 MCG/ACT AERS respimat [id number]  INHALE 1 PUFF BY MOUTH EVERY 6 HOURS [name], DO  Active   DULoxetine (CYMBALTA) 20 MG capsule [id number]  TAKE 1 CAPSULE(20 MG) BY MOUTH DAILY [place] Ranger, [name], DO  Active   FLUoxetine (PROZAC) 20 MG capsule [id number]  Take 3 capsules (60 mg total) by mouth daily. [name], DO  Active   Fluticasone-Umeclidin-Vilant (TRELEGY ELLIPTA) 200-62.5-25 MCG/INH AEPB [id number] Yes Inhale 1 puff into the lungs daily. Parrett, Fonnie Mu, NP Taking Active   furosemide (LASIX) 20 MG tablet [id number]  Take 1 tablet (20 mg total) by mouth daily. Jearld Fenton, NP  Active   gabapentin (NEURONTIN) 300 MG capsule [id number]  Take 2 capsules (600 mg total) by mouth 3 (three) times daily. [name], MD  Expired 06/11/20 2359            Med Note [facility], Kimmie Berggren A   Wed Feb 02, 2020  5:33 PM) Reports taking 2 capsules (600 mg) twice daily  hydrOXYzine (ATARAX/VISTARIL) 25 MG tablet [id number]  Take 25-50 mg by mouth daily as needed.  Patient not taking: Reported on 05/25/2020   [provider]  Active   Melatonin 3 MG CAPS [id number]  Take 10 mg by mouth at bedtime as needed (sleep).  [provider]  Active Self  metoprolol succinate (TOPROL-XL) 25 MG 24 hr tablet [id number]  TAKE 1/2 TABLET(12.5 MG) BY MOUTH DAILY Baity, Coralie Keens, NP  Active   montelukast (SINGULAIR) 10 MG tablet [id number]  Take 1 tablet (10 mg total) by mouth at bedtime. [name], DO  Active   QUEtiapine (SEROQUEL) 100 MG tablet [id number]  TAKE 1 TABLET(100 MG) BY MOUTH AT BEDTIME [name], DO  Active   Med List Note Rise Patience, RN 02/12/18 1055): Medication agreement signed 02/12/2018 UDS 02/24/17 03/25/17 PA requested for gabapentin sent SB           Patient Active Problem List   Diagnosis Date Noted  . Rash 01/14/2020  . Pulmonary hypertension ([place]) 09/14/2019  . Coccydynia  07/29/2019  . Cervicalgia 05/04/2019  . Acute pain of left shoulder 05/04/2019  . Elbow pain, left 05/04/2019  . DOE (dyspnea on exertion) 03/23/2019  . Leg swelling 03/22/2019    Class: Acute  . Lumbar degenerative disc disease 09/10/2018  . Left hip pain 09/10/2018  . Post-traumatic osteoarthritis of left elbow 07/14/2018  . Lumbar radiculopathy 02/12/2018  . Lumbar spondylosis 02/12/2018  . Lumbar facet arthropathy 02/12/2018  . Atherosclerosis of aorta ([place]) 09/17/2017  . Lung nodule 09/17/2017  . Urinary tract infection 12/05/2016  . Protein-calorie malnutrition, mild ([place]) 08/07/2016  . Generalized anxiety disorder 07/15/2016  . Chronic pain syndrome 07/15/2016  . Severe episode of recurrent major depressive disorder, without psychotic features ([place]) 07/14/2016  . Moderate benzodiazepine use disorder ([place]) 05/08/2016  . Major depressive disorder, recurrent, severe w/o psychotic behavior ([place]) 05/01/2016  . Seasonal allergic rhinitis  03/23/2015  . Hyperglycemia 03/23/2015  . Senile purpura ([place]) 03/23/2015  . Perennial allergic rhinitis with seasonal variation 03/23/2015  . Marital problems 10/31/2014  . Migraine without aura and without status migrainosus, not intractable 10/31/2014  . Chronic bilateral low back pain without sciatica 08/09/2014  . Colon polyp 08/09/2014  . COPD, severe ([name]) 08/09/2014  . CVA, old, hemiparesis ([place]) 08/09/2014  . Dyslipidemia 08/09/2014  . Dysfunction of eustachian tube 08/09/2014  . Fibromyalgia syndrome 08/09/2014  . Gastro-esophageal reflux disease without esophagitis 08/09/2014  . Benign migrating glossitis 08/09/2014  . Cerebrovascular accident, old 08/09/2014  . IBS (irritable bowel syndrome) 08/09/2014  . Low back pain with radiation 08/09/2014  . Chronic recurrent major depressive disorder ([place]) 08/09/2014  . Dysmetabolic syndrome 25/95/6387  . OP (osteoporosis) 08/09/2014  . Vitamin D deficiency 08/09/2014  . Smoking  08/09/2014  . Benign hypertension 07/19/2013  . Benign neoplasm of skin of trunk 06/03/2013  . H/O: pneumonia 09/25/2012    Immunization History  Administered Date(s) Administered  . Influenza Split 11/13/2006, 11/02/2008  . Influenza, High Dose Seasonal PF 09/27/2014, 09/29/2015, 12/30/2016, 10/15/2018, 11/05/2019  . Influenza, Seasonal, Injecte, Preservative Fre 11/12/2010  . Influenza,inj,Quad PF,6+ Mos 10/08/2012  . Influenza-Unspecified 04/08/2014  . PFIZER(Purple Top)SARS-COV-2 Vaccination 03/14/2019, 04/09/2019  . PPD Test 05/16/2016  . Pneumococcal Conjugate-13 09/27/2014  . Pneumococcal Polysaccharide-23 06/12/2010  . Tdap 03/19/2007, 09/29/2015  . Zoster, Live 04/16/2012    Conditions to be addressed/monitored: HTN, HLD and COPD  Care Plan : PharmD - Med Mgmt  Updates made by Vella Raring, RPH-CPP since 07/05/2020 12:00 AM    Problem: Disease Progression     Long-Range Goal: Disease Progression Prevented or Minimized   Start Date: 04/10/2020  Expected End Date: 07/09/2020  This Visit's Progress: On track  Recent Progress: On track  Priority: High  Note:   Current Barriers:  . Chronic Disease Management support, education, and care coordination needs related to previous CVA, COPD, hypertension, hyperlipidemia, depression  Pharmacist Clinical Goal(s):  Marland Kitchen Over the next 90 days, patient will achieve adherence to monitoring guidelines and medication adherence to achieve therapeutic efficacy through collaboration with PharmD and provider.   Interventions: . 1:1 collaboration with Webb Silversmith, NP regarding development and update of comprehensive plan of care as evidenced by provider attestation and co-signature . Inter-disciplinary care team collaboration (see longitudinal plan of care) . Perform chart review. Note CCM Social Worker has made 3 attempts to reach patient.  . Unable to complete full medication review today. Will review during next  appointment . Confirms planning to attend appointment today for lung cancer screening [facility]  Moderate to Severe COPD:  . Patient followed by [facility] in [place] . Current treatment: o Trelegy 1 puff daily , rinse after use - Patient confirms rinsing after use.  o Combivent as needed . PFT completed on 09/09/2019 . Today reports has had chest congestion and has been coughing up green mucus for ~2 weeks. Reports has shortness of breath with exertion, but not more than usual and denies other symptoms. States that she had waited to see if the cough would resolve on its own, but will call to follow up with Pulmonologist . Encourage patient to call today and provide phone number  Tobacco Use: . Reports has reduced smoking to ~6 cigarettes/day . Encourage smoking cessation . Patient reports working on cutting back, but not currently ready to set a quit date  Hypertension . Reports taking: o Furosemide 20 mg  daily o Metoprolol ER 25 mg - 1/2 tablet (12.5 mg ) daily . Note patient has home upper BP monitor . Denies monitoring home BP recently . Encourage patient to restart monitoring, keep log of BP and bring record to upcoming appointment with Nephrology . Have encouraged patient to follow up with providers if has concerns about leg swelling or any new or worsening medical concerns  Coordination of Care:  . Encourage patient to follow up with CCM Social Worker, provide patient with phone number . Reports has an appointment with Psychiatrist Gerome Sam at [facility] [phone]) on July 15th as scheduled for her by [employer], but unsure of practice address or time of appointment. . Place call to [employer] [phone]) with patient on the line. Speak with Lexine Baton in the office who advises that she does not see an appointment for patient in the system and is unable to access Dr. Baird Cancer' schedule currently. States that  she will call back once she can access provider's schedule, but in the meantime, will email patient the intake paperwork for the office o Encourage patient to call to follow up if has not heard back by tomorrow . Will collaborate with CCM team  Patient Goals/Self-Care Activities . Over the next 90 days, patient will:  . To self administers medications as prescribed o Encourage patient to obtain weekly pillbox to use as adherence tool . Attends all scheduled provider appointments . Calls pharmacy for medication refills . Calls provider office for new concerns or questions  Follow Up Plan: Telephone follow up appointment with care management team member as previously scheduled for: 08/16/2020 at 10:00 AM      Patient's preferred pharmacy is:  [id number] - [address], [address] AT [address] [address] Phone: [phone] Fax: [phone]   Follow Up:  Patient agrees to Care Plan and Follow-up.  [name], PharmD, [place], CPP Clinical Pharmacist [facility] [phone]

## 2020-07-05 NOTE — Patient Instructions (Signed)
Visit Information  PATIENT GOALS: Goals Addressed            This Visit's Progress   . Pharmacy Goals       Our goal bad cholesterol, or LDL, is less than 70. This is why it is important to continue taking your atorvastatin  Please check your home blood pressure, keep a log of the results and bring this with you to your medical appointments.  Feel free to call me with any questions or concerns. I look forward to our next call!  [name], PharmD, [place] [phone]       The patient verbalized understanding of instructions, educational materials, and care plan provided today and declined offer to receive copy of patient instructions, educational materials, and care plan.   Telephone follow up appointment with care management team member scheduled for: 08/16/2020 at 10:00 AM

## 2020-07-05 NOTE — Telephone Encounter (Signed)
Prednisone taper pack number 21 tablets as directed.  Azithromycin Z-Pak.  She needs to make a follow-up appointment.  She has not followed up as instructed and she is pushing almost a year of not being seen.

## 2020-07-05 NOTE — Telephone Encounter (Signed)
Rx for prednisone and zpak has been sent to preferred pharmacy.  Patient is aware and voiced her understanding.  Pending OV for 08/15/2020. Nothing further needed at this time

## 2020-07-07 ENCOUNTER — Ambulatory Visit: Payer: Self-pay | Admitting: Licensed Clinical Social Worker

## 2020-07-07 DIAGNOSIS — G8929 Other chronic pain: Secondary | ICD-10-CM

## 2020-07-07 DIAGNOSIS — F411 Generalized anxiety disorder: Secondary | ICD-10-CM

## 2020-07-07 DIAGNOSIS — M545 Low back pain, unspecified: Secondary | ICD-10-CM

## 2020-07-07 DIAGNOSIS — F332 Major depressive disorder, recurrent severe without psychotic features: Secondary | ICD-10-CM

## 2020-07-07 DIAGNOSIS — J449 Chronic obstructive pulmonary disease, unspecified: Secondary | ICD-10-CM

## 2020-07-11 ENCOUNTER — Telehealth: Payer: Self-pay | Admitting: Licensed Clinical Social Worker

## 2020-07-11 NOTE — Telephone Encounter (Signed)
error 

## 2020-07-13 ENCOUNTER — Telehealth: Payer: Self-pay | Admitting: *Deleted

## 2020-07-13 NOTE — Telephone Encounter (Signed)
Notified patient of LDCT lung cancer screening program results with recommendation for 6 month follow up imaging. Also notified of incidental findings noted below and is encouraged to discuss further with PCP who will receive a copy of this note and/or the CT report. Patient verbalizes understanding.    IMPRESSION: 1. Lung-RADS 3, probably benign findings. New 4 mm right upper lobe pulmonary nodule. Short-term follow-up in 6 months is recommended with repeat low-dose chest CT without contrast (please use the following order, "CT CHEST LCS NODULE FOLLOW-UP W/O CM"). 2. Emphysema (ICD10-J43.9) and Aortic Atherosclerosis (ICD10-170.0)

## 2020-07-14 NOTE — Patient Instructions (Signed)
Visit Information   Goals Addressed               This Visit's Progress     Patient Stated     SW: I'm doing better now with my stress (pt-stated)   On track     Patient Self Care Activities:  Call provider office for new concerns or questions Contact office with any questions or concerns Practice deep breathing and grounding strategies to assist with management of symptoms       Other     SW-Track and Manage My Symptoms-Depression   On track     Timeframe:  Long-Range Goal Priority:  Medium Start Date:  04/25/20                          Expected End Date:  09/27/20                 Follow Up Date-07/18/20   Patient Self Care Activities:  Call provider office for new concerns or questions Contact office with any questions or concerns Practice deep breathing and grounding strategies to assist with management of symptoms         Patient verbalizes understanding of instructions provided today.   Telephone follow up appointment with care management team member scheduled for:07/18/20  Christa See, MSW, [email or web address] Phone [phone] 12:39 PM

## 2020-07-14 NOTE — Chronic Care Management (AMB) (Signed)
Chronic Care Management    Clinical Social Work Note  07/14/2020 Name: Beth [name] MRN: [id number] DOB: Nov 16, 1945  Beth [name] is a 75 y.o. year old female who is a primary care patient of Lorine Bears, Lupita Raider, FNP. The CCM team was consulted to assist the patient with chronic disease management and/or care coordination needs related to: Mental Health Counseling and Resources.   Engaged with patient by telephone for follow up visit in response to provider referral for social work chronic care management and care coordination services.   Consent to Services:  The patient was given information about Chronic Care Management services, agreed to services, and gave verbal consent prior to initiation of services.  Please see initial visit note for detailed documentation.   Patient agreed to services and consent obtained.   Assessment: Patient is engaged in conversation, continues to maintain positive progress with care plan goals. Patient was referred to psychiatrist and is awaiting a call back for scheduling. Grounding strategies to assist with anxiety and depression symptoms discussed. See Care Plan below for interventions and patient self-care actives. Recommendation: Patient may benefit from, and is in agreement to work with LCSW to address care coordination needs and will continue to work with the clinical team to address health care and disease management related needs.  Follow up Plan: Patient would like continued follow-up.  CCM LCSW will follow up with patient on 07/18/20. Patient will call office if needed prior to next encounter.  SDOH (Social Determinants of Health) assessments and interventions performed:    Advanced Directives Status: Not addressed in this encounter.  CCM Care Plan  Allergies  Allergen Reactions   Bextra  [Valdecoxib]    Compazine [Prochlorperazine Edisylate]     Stroke-like symptoms   Lithium Carbonate     Leg weakness   Lyrica [Pregabalin]      Outpatient Encounter Medications as of 07/07/2020  Medication Sig   acetaminophen (TYLENOL) 500 MG tablet Take 500 mg by mouth 2 (two) times daily as needed.  (Patient not taking: Reported on 05/25/2020)   Acetaminophen-Codeine 300-15 MG TABS Take by mouth. 1 tablet every 4-6 hours as needed per dentist   aspirin 81 MG chewable tablet Chew 81 mg daily by mouth.   atorvastatin (LIPITOR) 40 MG tablet TAKE 1 TABLET BY MOUTH EVERY DAY   azelastine (ASTELIN) 0.1 % nasal spray Place 2 sprays into both nostrils 2 (two) times daily.   [EXPIRED] azithromycin (ZITHROMAX) 250 MG tablet Take 2 tablets (500 mg) on  Day 1,  followed by 1 tablet (250 mg) once daily on Days 2 through 5.   clonazePAM (KLONOPIN) 0.5 MG tablet TAKE 1 TABLET(0.5 MG) BY MOUTH EVERY OTHER DAY AS NEEDED FOR ANXIETY   COMBIVENT RESPIMAT 20-100 MCG/ACT AERS respimat INHALE 1 PUFF BY MOUTH EVERY 6 HOURS   DULoxetine (CYMBALTA) 20 MG capsule TAKE 1 CAPSULE(20 MG) BY MOUTH DAILY   FLUoxetine (PROZAC) 20 MG capsule Take 3 capsules (60 mg total) by mouth daily.   Fluticasone-Umeclidin-Vilant (TRELEGY ELLIPTA) 200-62.5-25 MCG/INH AEPB Inhale 1 puff into the lungs daily.   furosemide (LASIX) 20 MG tablet Take 1 tablet (20 mg total) by mouth daily.   hydrOXYzine (ATARAX/VISTARIL) 25 MG tablet Take 25-50 mg by mouth daily as needed. (Patient not taking: Reported on 05/25/2020)   Melatonin 3 MG CAPS Take 10 mg by mouth at bedtime as needed (sleep).    metoprolol succinate (TOPROL-XL) 25 MG 24 hr tablet TAKE 1/2 TABLET(12.5 MG) BY MOUTH DAILY  montelukast (SINGULAIR) 10 MG tablet Take 1 tablet (10 mg total) by mouth at bedtime.   predniSONE (STERAPRED UNI-PAK 21 TAB) 10 MG (21) TBPK tablet Use as directed   QUEtiapine (SEROQUEL) 100 MG tablet TAKE 1 TABLET(100 MG) BY MOUTH AT BEDTIME   Facility-Administered Encounter Medications as of 07/07/2020  Medication   albuterol (PROVENTIL) (2.5 MG/3ML) 0.083% nebulizer solution 2.5 mg    Patient  Active Problem List   Diagnosis Date Noted   Rash 01/14/2020   Pulmonary hypertension ([place]) 09/14/2019   Coccydynia 07/29/2019   Cervicalgia 05/04/2019   Acute pain of left shoulder 05/04/2019   Elbow pain, left 05/04/2019   DOE (dyspnea on exertion) 03/23/2019   Leg swelling 03/22/2019    Class: Acute   Lumbar degenerative disc disease 09/10/2018   Left hip pain 09/10/2018   Post-traumatic osteoarthritis of left elbow 07/14/2018   Lumbar radiculopathy 02/12/2018   Lumbar spondylosis 02/12/2018   Lumbar facet arthropathy 02/12/2018   Atherosclerosis of aorta ([place]) 09/17/2017   Lung nodule 09/17/2017   Urinary tract infection 12/05/2016   Protein-calorie malnutrition, mild (HCC) 08/07/2016   Generalized anxiety disorder 07/15/2016   Chronic pain syndrome 07/15/2016   Severe episode of recurrent major depressive disorder, without psychotic features ([place]) 07/14/2016   Moderate benzodiazepine use disorder ([place]) 05/08/2016   Major depressive disorder, recurrent, severe w/o psychotic behavior ([place]) 05/01/2016   Seasonal allergic rhinitis 03/23/2015   Hyperglycemia 03/23/2015   Senile purpura ([place]) 03/23/2015   Perennial allergic rhinitis with seasonal variation 03/23/2015   Marital problems 10/31/2014   Migraine without aura and without status migrainosus, not intractable 10/31/2014   Chronic bilateral low back pain without sciatica 08/09/2014   Colon polyp 08/09/2014   COPD, severe ([place]) 08/09/2014   CVA, old, hemiparesis ([place]) 08/09/2014   Dyslipidemia 08/09/2014   Dysfunction of eustachian tube 08/09/2014   Fibromyalgia syndrome 08/09/2014   Gastro-esophageal reflux disease without esophagitis 08/09/2014   Benign migrating glossitis 08/09/2014   Cerebrovascular accident, old 08/09/2014   IBS (irritable bowel syndrome) 08/09/2014   Low back pain with radiation 08/09/2014   Chronic recurrent major depressive disorder ([place]) 01/77/9390   Dysmetabolic syndrome 30/09/2328   OP  (osteoporosis) 08/09/2014   Vitamin D deficiency 08/09/2014   Smoking 08/09/2014   Benign hypertension 07/19/2013   Benign neoplasm of skin of trunk 06/03/2013   H/O: pneumonia 09/25/2012    Conditions to be addressed/monitored: Anxiety and Depression; Mental Health Concerns   Care Plan : General Social Work (Adult)  Updates made by Christa See D, LCSW since 07/14/2020 12:00 AM     Problem: Response to Treatment (Depression)      Long-Range Goal: Response to Treatment Maximized   Start Date: 04/25/2020  This Visit's Progress: On track  Recent Progress: On track  Priority: Medium  Note:   Timeframe:  Long-Range Goal Priority:  Medium Start Date:  04/25/20                          Expected End Date:  09/27/20                 Follow Up Date-07/18/20   Current Barriers:  Chronic Mental Health needs related to depression Financial constraints related to managing health care expenses Limited social support ADL IADL limitations Limited access to caregiver Inability to perform ADL's independently Suicidal Ideation/Homicidal Ideation: No  Clinical Social Work Goal(s):  Over the next 120 days, patient will work with SW bi-monthly by  telephone or in person to reduce or manage symptoms related to depression Over the next 120 days, patient will work with SW to address concerns related to decreasing depression and increasing self-care  Interventions: Patient interviewed and appropriate assessments performed: brief mental health assessment Patient reports difficulty obtaining a psychiatrist. States she was referred to Reather Laurence [phone] through [employer] Front desk staff was going to call pt back after confirming Dr. Tye Savoy accepts pt's insurance. Patient reports that she is awaiting return call Patient is interested in med management to assist with management of anxiety and depression symptoms. Patient endorses a lot of panic attacks CCM LCSW discussed grounding  strategies to assist with anxiety. Patient agreed to continue practicing breathing exercises to cope Patient is close with son and receives strong support from him. States that she doesn't speak often with daughter; however, children reside locally. Patient's son visited her today and calls daily. She receives emotional support from dog. Patient denies any suicidal/homicidal ideations Collaboration with PCP regarding development and update of comprehensive plan of care as evidenced by provider attestation and co-signature Inter-disciplinary care team collaboration (see longitudinal plan of care) Patient Self Care Activities:  Call provider office for new concerns or questions Contact office with any questions or concerns Practice deep breathing and grounding strategies to assist with management of symptoms        Christa See, MSW, [email or web address] Phone [phone] 12:35 PM

## 2020-07-18 ENCOUNTER — Ambulatory Visit: Payer: Self-pay | Admitting: Licensed Clinical Social Worker

## 2020-07-18 DIAGNOSIS — F411 Generalized anxiety disorder: Secondary | ICD-10-CM

## 2020-07-18 DIAGNOSIS — J449 Chronic obstructive pulmonary disease, unspecified: Secondary | ICD-10-CM

## 2020-07-18 DIAGNOSIS — F332 Major depressive disorder, recurrent severe without psychotic features: Secondary | ICD-10-CM

## 2020-07-19 NOTE — Patient Instructions (Signed)
Visit Information   Goals Addressed               This Visit's Progress     Patient Stated     SW: I'm doing better now with my stress (pt-stated)   On track     Patient Self Care Activities:  Schedule initial appointment with [facility] for med management Call provider office for new concerns or questions Contact office with any questions or concerns Practice deep breathing and grounding strategies to assist with management of symptoms       Other     SW-Track and Manage My Symptoms-Depression   On track     Timeframe:  Long-Range Goal Priority:  Medium Start Date:  04/25/20                          Expected End Date:  09/27/20                 Follow Up Date-07/25/20   Patient Self Care Activities:  Schedule initial appointment with [facility] for med management Call provider office for new concerns or questions Contact office with any questions or concerns Practice deep breathing and grounding strategies to assist with management of symptoms         Patient verbalizes understanding of instructions provided today and agrees to view in [place].   Telephone follow up appointment with care management team member scheduled for:  Christa See, MSW, [name] Phone [phone] 11:49 AM

## 2020-07-19 NOTE — Chronic Care Management (AMB) (Signed)
Chronic Care Management    Clinical Social Work Note  07/19/2020 Name: Beth [name] MRN: [id number] DOB: October 19, 1945  Beth [name] is a 75 y.o. year old female who is a primary care patient of Lorine Bears, Lupita Raider, FNP. The CCM team was consulted to assist the patient with chronic disease management and/or care coordination needs related to: Mental Health Counseling and Resources.   Engaged with patient by telephone for follow up visit in response to provider referral for social work chronic care management and care coordination services.   Consent to Services:  The patient was given information about Chronic Care Management services, agreed to services, and gave verbal consent prior to initiation of services.  Please see initial visit note for detailed documentation.   Patient agreed to services and consent obtained.   Assessment: Patient is engaged in conversation, continues to maintain positive progress with care plan goals. Referral for medication management through [employer] has been accepted and Investment banker, operational agreed to schedule initial intake appointment within a week. Patient was informed and agreed to reach out to agency. See Care Plan below for interventions and patient self-care actives. Recommendation: Patient may benefit from, and is in agreement to work with LCSW to address care coordination needs and will continue to work with the clinical team to address health care and disease management related needs.  Follow up Plan: Patient would like continued follow-up.  CCM LCSW will follow up with patient on 07/25/20. Patient will call office if needed prior to next encounter.   SDOH (Social Determinants of Health) assessments and interventions performed:    Advanced Directives Status: Not addressed in this encounter.  CCM Care Plan  Allergies  Allergen Reactions   Bextra  [Valdecoxib]    Compazine [Prochlorperazine Edisylate]     Stroke-like symptoms    Lithium Carbonate     Leg weakness   Lyrica [Pregabalin]     Outpatient Encounter Medications as of 07/18/2020  Medication Sig Note   acetaminophen (TYLENOL) 500 MG tablet Take 500 mg by mouth 2 (two) times daily as needed.  (Patient not taking: Reported on 05/25/2020)    Acetaminophen-Codeine 300-15 MG TABS Take by mouth. 1 tablet every 4-6 hours as needed per dentist    aspirin 81 MG chewable tablet Chew 81 mg daily by mouth.    atorvastatin (LIPITOR) 40 MG tablet TAKE 1 TABLET BY MOUTH EVERY DAY    azelastine (ASTELIN) 0.1 % nasal spray Place 2 sprays into both nostrils 2 (two) times daily.    clonazePAM (KLONOPIN) 0.5 MG tablet TAKE 1 TABLET(0.5 MG) BY MOUTH EVERY OTHER DAY AS NEEDED FOR ANXIETY    COMBIVENT RESPIMAT 20-100 MCG/ACT AERS respimat INHALE 1 PUFF BY MOUTH EVERY 6 HOURS    DULoxetine (CYMBALTA) 20 MG capsule TAKE 1 CAPSULE(20 MG) BY MOUTH DAILY    FLUoxetine (PROZAC) 20 MG capsule Take 3 capsules (60 mg total) by mouth daily.    Fluticasone-Umeclidin-Vilant (TRELEGY ELLIPTA) 200-62.5-25 MCG/INH AEPB Inhale 1 puff into the lungs daily.    furosemide (LASIX) 20 MG tablet Take 1 tablet (20 mg total) by mouth daily.    gabapentin (NEURONTIN) 300 MG capsule Take 2 capsules (600 mg total) by mouth 3 (three) times daily. 02/02/2020: Reports taking 2 capsules (600 mg) twice daily   hydrOXYzine (ATARAX/VISTARIL) 25 MG tablet Take 25-50 mg by mouth daily as needed. (Patient not taking: Reported on 05/25/2020)    Melatonin 3 MG CAPS Take 10 mg by mouth at bedtime as  needed (sleep).     metoprolol succinate (TOPROL-XL) 25 MG 24 hr tablet TAKE 1/2 TABLET(12.5 MG) BY MOUTH DAILY    montelukast (SINGULAIR) 10 MG tablet Take 1 tablet (10 mg total) by mouth at bedtime.    predniSONE (STERAPRED UNI-PAK 21 TAB) 10 MG (21) TBPK tablet Use as directed    QUEtiapine (SEROQUEL) 100 MG tablet TAKE 1 TABLET(100 MG) BY MOUTH AT BEDTIME    Facility-Administered Encounter Medications as of 07/18/2020   Medication   albuterol (PROVENTIL) (2.5 MG/3ML) 0.083% nebulizer solution 2.5 mg    Patient Active Problem List   Diagnosis Date Noted   Rash 01/14/2020   Pulmonary hypertension ([place]) 09/14/2019   Coccydynia 07/29/2019   Cervicalgia 05/04/2019   Acute pain of left shoulder 05/04/2019   Elbow pain, left 05/04/2019   DOE (dyspnea on exertion) 03/23/2019   Leg swelling 03/22/2019    Class: Acute   Lumbar degenerative disc disease 09/10/2018   Left hip pain 09/10/2018   Post-traumatic osteoarthritis of left elbow 07/14/2018   Lumbar radiculopathy 02/12/2018   Lumbar spondylosis 02/12/2018   Lumbar facet arthropathy 02/12/2018   Atherosclerosis of aorta ([place]) 09/17/2017   Lung nodule 09/17/2017   Urinary tract infection 12/05/2016   Protein-calorie malnutrition, mild (HCC) 08/07/2016   Generalized anxiety disorder 07/15/2016   Chronic pain syndrome 07/15/2016   Severe episode of recurrent major depressive disorder, without psychotic features ([place]) 07/14/2016   Moderate benzodiazepine use disorder ([place]) 05/08/2016   Major depressive disorder, recurrent, severe w/o psychotic behavior ([place]) 05/01/2016   Seasonal allergic rhinitis 03/23/2015   Hyperglycemia 03/23/2015   Senile purpura ([place]) 03/23/2015   Perennial allergic rhinitis with seasonal variation 03/23/2015   Marital problems 10/31/2014   Migraine without aura and without status migrainosus, not intractable 10/31/2014   Chronic bilateral low back pain without sciatica 08/09/2014   Colon polyp 08/09/2014   COPD, severe ([place]) 08/09/2014   CVA, old, hemiparesis ([place]) 08/09/2014   Dyslipidemia 08/09/2014   Dysfunction of eustachian tube 08/09/2014   Fibromyalgia syndrome 08/09/2014   Gastro-esophageal reflux disease without esophagitis 08/09/2014   Benign migrating glossitis 08/09/2014   Cerebrovascular accident, old 08/09/2014   IBS (irritable bowel syndrome) 08/09/2014   Low back pain with radiation 08/09/2014   Chronic  recurrent major depressive disorder ([place]) 70/62/3762   Dysmetabolic syndrome 83/15/1761   OP (osteoporosis) 08/09/2014   Vitamin D deficiency 08/09/2014   Smoking 08/09/2014   Benign hypertension 07/19/2013   Benign neoplasm of skin of trunk 06/03/2013   H/O: pneumonia 09/25/2012    Conditions to be addressed/monitored: Anxiety and Depression; Mental Health Concerns   Care Plan : General Social Work (Adult)  Updates made by Christa See D, LCSW since 07/19/2020 12:00 AM     Problem: Response to Treatment (Depression)      Long-Range Goal: Response to Treatment Maximized   Start Date: 04/25/2020  This Visit's Progress: On track  Recent Progress: On track  Priority: Medium  Note:   Timeframe:  Long-Range Goal Priority:  Medium Start Date:  04/25/20                          Expected End Date:  09/27/20                 Follow Up Date-   Current Barriers:  Chronic Mental Health needs related to depression Financial constraints related to managing health care expenses Limited social support ADL IADL limitations Limited access to caregiver  Inability to perform ADL's independently Suicidal Ideation/Homicidal Ideation: No  Clinical Social Work Goal(s):  Over the next 120 days, patient will work with SW bi-monthly by telephone or in person to reduce or manage symptoms related to depression Over the next 120 days, patient will work with SW to address concerns related to decreasing depression and increasing self-care  Interventions: Patient interviewed and appropriate assessments performed: brief mental health assessment CCM LCSW collaborated with [employer] [phone] to inquire about psychiatry appointment. Spoke with Program Director, Elberta Fortis, who reviewed patient's insurance and confirmed that they will schedule an initial intake with patient to begin medication management. Intake documents will be provided electronically for patient to complete, to expedite  services, which can be provided through tele-health or in-person  CCM LCSW informed patient to expect a call from [place], the intake specialist. Patient agreed to call and leave a message for a return call, if she is unable to reach them. States she is very motivated to begin services Patient reports that her depression and anxiety symptoms are "level"  Patient agreed to contact CCM LCSW with any update regarding scheduling with [facility] Patient is aware of strategies to assist with management of symptoms and agreed to continue practicing breathing exercises to cope Patient is close with son and receives strong support from him. States that she doesn't speak often with daughter; however, children reside locally. She receives emotional support from dog. Patient denies any suicidal/homicidal ideations Collaboration with PCP regarding development and update of comprehensive plan of care as evidenced by provider attestation and co-signature Inter-disciplinary care team collaboration (see longitudinal plan of care) Patient Self Care Activities:  Schedule initial appointment with [facility] for med management Call provider office for new concerns or questions Contact office with any questions or concerns Practice deep breathing and grounding strategies to assist with management of symptoms         Christa See, MSW, [email or web address] Phone [phone] 11:42 AM

## 2020-07-25 ENCOUNTER — Telehealth: Payer: Self-pay | Admitting: Licensed Clinical Social Worker

## 2020-07-25 ENCOUNTER — Ambulatory Visit: Payer: Self-pay | Admitting: Licensed Clinical Social Worker

## 2020-07-25 DIAGNOSIS — J449 Chronic obstructive pulmonary disease, unspecified: Secondary | ICD-10-CM

## 2020-07-25 DIAGNOSIS — F411 Generalized anxiety disorder: Secondary | ICD-10-CM

## 2020-07-25 DIAGNOSIS — F339 Major depressive disorder, recurrent, unspecified: Secondary | ICD-10-CM

## 2020-07-25 NOTE — Telephone Encounter (Signed)
    Clinical Social Work  Care Management   Phone Outreach    07/25/2020 Name: Beth [name] MRN: [id number] DOB: Apr 30, 1945  Beth [name] is a 75 y.o. year old female who is a primary care patient of Lorine Bears, Lupita Raider, FNP .   F/U phone call today to assess needs, and progress with care plan goals.    Plan:If no return call is received,  Review of patient status, including review of consultants reports, relevant laboratory and other test results, and collaboration with appropriate care team members and the patient's provider was performed as part of comprehensive patient evaluation and provision of care management services.

## 2020-07-26 NOTE — Patient Instructions (Signed)
Visit Information   Goals Addressed               This Visit's Progress     Patient Stated     SW: I'm doing better now with my stress (pt-stated)   On track     Patient Self Care Activities:  Schedule initial appointment with [facility] for med management Call provider office for new concerns or questions Contact office with any questions or concerns Practice deep breathing and grounding strategies to assist with management of symptoms       Other     SW-Track and Manage My Symptoms-Depression   On track     Timeframe:  Long-Range Goal Priority:  Medium Start Date:  04/25/20                          Expected End Date:  09/27/20                 Follow Up Date-07/25/20   Patient Self Care Activities:  Schedule initial appointment with [facility] for med management Call provider office for new concerns or questions Contact office with any questions or concerns Practice deep breathing and grounding strategies to assist with management of symptoms         Patient verbalizes understanding of instructions provided today.   Telephone follow up appointment with care management team member scheduled for:08/02/20  Christa See, MSW, [email or web address] Phone [phone] 2:58 PM

## 2020-07-26 NOTE — Telephone Encounter (Signed)
See previous note

## 2020-07-26 NOTE — Chronic Care Management (AMB) (Signed)
Chronic Care Management    Clinical Social Work Note  07/26/2020 Name: Beth [name] MRN: [id number] DOB: Mar 19, 1945  Beth [name] is a 75 y.o. year old female who is a primary care patient of Lorine Bears, Lupita Raider, FNP. The CCM team was consulted to assist the patient with chronic disease management and/or care coordination needs related to: Mental Health Counseling and Resources.   Engaged with patient by telephone for follow up visit in response to provider referral for social work chronic care management and care coordination services.   Consent to Services:  The patient was given information about Chronic Care Management services, agreed to services, and gave verbal consent prior to initiation of services.  Please see initial visit note for detailed documentation.   Patient agreed to services and consent obtained.   Assessment: Patient is currently experiencing difficulty with scheduling initial psychiatry appointment. CCM LCSW will provide additional assistance by contacting agency. See Care Plan below for interventions and patient self-care actives. Recent life changes /stressors: Inability to access intake paperwork through e-mail Recommendation: Patient may benefit from, and is in agreement to work with LCSW to address care coordination needs and will continue to work with the clinical team to address health care and disease management related needs.  Follow up Plan: Patient would like continued follow-up.  CCM LCSW will follow up with patient on 08/02/20. Patient will call office if needed prior to next encounter.    SDOH (Social Determinants of Health) assessments and interventions performed:    Advanced Directives Status: Not addressed in this encounter.  CCM Care Plan  Allergies  Allergen Reactions   Bextra  [Valdecoxib]    Compazine [Prochlorperazine Edisylate]     Stroke-like symptoms   Lithium Carbonate     Leg weakness   Lyrica [Pregabalin]     Outpatient  Encounter Medications as of 07/25/2020  Medication Sig Note   acetaminophen (TYLENOL) 500 MG tablet Take 500 mg by mouth 2 (two) times daily as needed.  (Patient not taking: Reported on 05/25/2020)    Acetaminophen-Codeine 300-15 MG TABS Take by mouth. 1 tablet every 4-6 hours as needed per dentist    aspirin 81 MG chewable tablet Chew 81 mg daily by mouth.    atorvastatin (LIPITOR) 40 MG tablet TAKE 1 TABLET BY MOUTH EVERY DAY    azelastine (ASTELIN) 0.1 % nasal spray Place 2 sprays into both nostrils 2 (two) times daily.    clonazePAM (KLONOPIN) 0.5 MG tablet TAKE 1 TABLET(0.5 MG) BY MOUTH EVERY OTHER DAY AS NEEDED FOR ANXIETY    COMBIVENT RESPIMAT 20-100 MCG/ACT AERS respimat INHALE 1 PUFF BY MOUTH EVERY 6 HOURS    DULoxetine (CYMBALTA) 20 MG capsule TAKE 1 CAPSULE(20 MG) BY MOUTH DAILY    FLUoxetine (PROZAC) 20 MG capsule Take 3 capsules (60 mg total) by mouth daily.    Fluticasone-Umeclidin-Vilant (TRELEGY ELLIPTA) 200-62.5-25 MCG/INH AEPB Inhale 1 puff into the lungs daily.    furosemide (LASIX) 20 MG tablet Take 1 tablet (20 mg total) by mouth daily.    gabapentin (NEURONTIN) 300 MG capsule Take 2 capsules (600 mg total) by mouth 3 (three) times daily. 02/02/2020: Reports taking 2 capsules (600 mg) twice daily   hydrOXYzine (ATARAX/VISTARIL) 25 MG tablet Take 25-50 mg by mouth daily as needed. (Patient not taking: Reported on 05/25/2020)    Melatonin 3 MG CAPS Take 10 mg by mouth at bedtime as needed (sleep).     metoprolol succinate (TOPROL-XL) 25 MG 24 hr tablet TAKE 1/2  TABLET(12.5 MG) BY MOUTH DAILY    montelukast (SINGULAIR) 10 MG tablet Take 1 tablet (10 mg total) by mouth at bedtime.    predniSONE (STERAPRED UNI-PAK 21 TAB) 10 MG (21) TBPK tablet Use as directed    QUEtiapine (SEROQUEL) 100 MG tablet TAKE 1 TABLET(100 MG) BY MOUTH AT BEDTIME    Facility-Administered Encounter Medications as of 07/25/2020  Medication   albuterol (PROVENTIL) (2.5 MG/3ML) 0.083% nebulizer solution 2.5 mg     Patient Active Problem List   Diagnosis Date Noted   Rash 01/14/2020   Pulmonary hypertension ([place]) 09/14/2019   Coccydynia 07/29/2019   Cervicalgia 05/04/2019   Acute pain of left shoulder 05/04/2019   Elbow pain, left 05/04/2019   DOE (dyspnea on exertion) 03/23/2019   Leg swelling 03/22/2019    Class: Acute   Lumbar degenerative disc disease 09/10/2018   Left hip pain 09/10/2018   Post-traumatic osteoarthritis of left elbow 07/14/2018   Lumbar radiculopathy 02/12/2018   Lumbar spondylosis 02/12/2018   Lumbar facet arthropathy 02/12/2018   Atherosclerosis of aorta ([place]) 09/17/2017   Lung nodule 09/17/2017   Urinary tract infection 12/05/2016   Protein-calorie malnutrition, mild (HCC) 08/07/2016   Generalized anxiety disorder 07/15/2016   Chronic pain syndrome 07/15/2016   Severe episode of recurrent major depressive disorder, without psychotic features ([place]) 07/14/2016   Moderate benzodiazepine use disorder ([place]) 05/08/2016   Major depressive disorder, recurrent, severe w/o psychotic behavior ([place]) 05/01/2016   Seasonal allergic rhinitis 03/23/2015   Hyperglycemia 03/23/2015   Senile purpura ([place]) 03/23/2015   Perennial allergic rhinitis with seasonal variation 03/23/2015   Marital problems 10/31/2014   Migraine without aura and without status migrainosus, not intractable 10/31/2014   Chronic bilateral low back pain without sciatica 08/09/2014   Colon polyp 08/09/2014   COPD, severe ([place]) 08/09/2014   CVA, old, hemiparesis ([place]) 08/09/2014   Dyslipidemia 08/09/2014   Dysfunction of eustachian tube 08/09/2014   Fibromyalgia syndrome 08/09/2014   Gastro-esophageal reflux disease without esophagitis 08/09/2014   Benign migrating glossitis 08/09/2014   Cerebrovascular accident, old 08/09/2014   IBS (irritable bowel syndrome) 08/09/2014   Low back pain with radiation 08/09/2014   Chronic recurrent major depressive disorder ([place]) 83/38/2505   Dysmetabolic syndrome  39/76/7341   OP (osteoporosis) 08/09/2014   Vitamin D deficiency 08/09/2014   Smoking 08/09/2014   Benign hypertension 07/19/2013   Benign neoplasm of skin of trunk 06/03/2013   H/O: pneumonia 09/25/2012    Conditions to be addressed/monitored: Anxiety and Depression; Mental Health Concerns   Care Plan : General Social Work (Adult)  Updates made by Christa See D, LCSW since 07/26/2020 12:00 AM     Problem: Response to Treatment (Depression)      Long-Range Goal: Response to Treatment Maximized   Start Date: 04/25/2020  This Visit's Progress: On track  Recent Progress: On track  Priority: Medium  Note:   Timeframe:  Long-Range Goal Priority:  Medium Start Date:  04/25/20                          Expected End Date:  09/27/20                 Follow Up Date-   Current Barriers:  Chronic Mental Health needs related to depression Financial constraints related to managing health care expenses Limited social support ADL IADL limitations Limited access to caregiver Inability to perform ADL's independently Suicidal Ideation/Homicidal Ideation: No  Clinical Social Work Goal(s):  Over  the next 120 days, patient will work with SW bi-monthly by telephone or in person to reduce or manage symptoms related to depression Over the next 120 days, patient will work with SW to address concerns related to decreasing depression and increasing self-care  Interventions: Patient interviewed and appropriate assessments performed: brief mental health assessment Patient reports that [employer] e-mailed intake paperwork to her e-mail. Patient's e-mail is inaccessible  Patient has left two messages for a return call; however, she has not heard back  CCM LCSW will make additional attempts to call agency to inform them of patient concerns CCM LCSW collaborated with [employer] [phone] to inquire about psychiatry appointment. Spoke with Program Director, Elberta Fortis,  who reviewed patient's insurance and confirmed that they will schedule an initial intake with patient to begin medication management. Intake documents will be provided electronically for patient to complete, to expedite services, which can be provided through tele-health or in-person  CCM LCSW informed patient to expect a call from [place], the intake specialist. Patient agreed to call and leave a message for a return call, if she is unable to reach them. States she is very motivated to begin services Patient reports that her depression and anxiety symptoms are "level"  Patient agreed to contact CCM LCSW with any update regarding scheduling with [facility] Patient is aware of strategies to assist with management of symptoms and agreed to continue practicing breathing exercises to cope Patient is close with son and receives strong support from him. States that she doesn't speak often with daughter; however, children reside locally. She receives emotional support from dog. Patient denies any suicidal/homicidal ideations Collaboration with PCP regarding development and update of comprehensive plan of care as evidenced by provider attestation and co-signature Inter-disciplinary care team collaboration (see longitudinal plan of care) Patient Self Care Activities:  Schedule initial appointment with [facility] for med management Call provider office for new concerns or questions Contact office with any questions or concerns Practice deep breathing and grounding strategies to assist with management of symptoms         Christa See, MSW, [email or web address] Phone [phone] 2:52 PM

## 2020-08-02 ENCOUNTER — Telehealth: Payer: Self-pay

## 2020-08-02 ENCOUNTER — Telehealth: Payer: Self-pay | Admitting: Licensed Clinical Social Worker

## 2020-08-02 NOTE — Telephone Encounter (Signed)
    Clinical Social Work  Chronic Care Management   Phone Outreach    08/02/2020 Name: Beth [name] MRN: [id number] DOB: 1945-11-30  Beth [name] is a 75 y.o. year old female who is a primary care patient of Lorine Bears, Lupita Raider, FNP .   F/U phone call today to assess needs, progress and barriers with care plan goals.   Telephone outreach was unsuccessful A HIPPA compliant phone message was left for the patient providing contact information and requesting a return call.   Plan:CCM LCSW will wait for return call. If no return call is received, Will reach out to patient again in the next 30 days .   Review of patient status, including review of consultants reports, relevant laboratory and other test results, and collaboration with appropriate care team members and the patient's provider was performed as part of comprehensive patient evaluation and provision of care management services.    Christa See, MSW, [facility] Care Management [email or web address] Phone [phone] 4:30 PM

## 2020-08-04 ENCOUNTER — Other Ambulatory Visit: Payer: Self-pay | Admitting: Family Medicine

## 2020-08-04 DIAGNOSIS — F332 Major depressive disorder, recurrent severe without psychotic features: Secondary | ICD-10-CM

## 2020-08-04 DIAGNOSIS — F5104 Psychophysiologic insomnia: Secondary | ICD-10-CM

## 2020-08-04 NOTE — Telephone Encounter (Signed)
Requested medication (s) are due for refill today - yes  Requested medication (s) are on the active medication list -yes  Future visit scheduled -no  Last refill: 05/27/20  Notes to clinic: Request RF- non delegated Rx  Requested Prescriptions  Pending Prescriptions Disp Refills   QUEtiapine (SEROQUEL) 100 MG tablet [Pharmacy Med Name: QUETIAPINE 100MG  TABLETS] 30 tablet 1    Sig: TAKE 1 TABLET(100 MG) BY MOUTH AT BEDTIME      Not Delegated - Psychiatry:  Antipsychotics - Second Generation (Atypical) - quetiapine Failed - 08/04/2020  4:03 PM      Failed - This refill cannot be delegated      Failed - ALT in normal range and within 180 days    ALT  Date Value Ref Range Status  01/14/2020 27 6 - 29 U/L Final   SGPT (ALT)  Date Value Ref Range Status  12/20/2012 14 12 - 78 U/L Final          Failed - AST in normal range and within 180 days    AST  Date Value Ref Range Status  01/14/2020 23 10 - 35 U/L Final   SGOT(AST)  Date Value Ref Range Status  12/20/2012 23 15 - 37 Unit/L Final          Passed - Completed PHQ-2 or PHQ-9 in the last 360 days      Passed - Last BP in normal range    BP Readings from Last 1 Encounters:  04/19/20 (!) 123/59          Passed - Valid encounter within last 6 months    Recent Outpatient Visits           3 months ago Major depressive disorder, recurrent, severe w/o psychotic behavior ([place])   [facility] [place], [name], DO   6 months ago [facility] [place], Lupita Raider, FNP   1 year ago Benign hypertension   [facility], Lupita Raider, FNP   1 year ago Leg swelling   [facility], Lupita Raider, FNP   1 year ago Leg swelling   [facility], Lupita Raider, FNP       Future Appointments             In 1 week [name], MD [facility] [place]                 Requested Prescriptions  Pending Prescriptions  Disp Refills   QUEtiapine (SEROQUEL) 100 MG tablet [Pharmacy Med Name: QUETIAPINE 100MG  TABLETS] 30 tablet 1    Sig: TAKE 1 TABLET(100 MG) BY MOUTH AT BEDTIME      Not Delegated - Psychiatry:  Antipsychotics - Second Generation (Atypical) - quetiapine Failed - 08/04/2020  4:03 PM      Failed - This refill cannot be delegated      Failed - ALT in normal range and within 180 days    ALT  Date Value Ref Range Status  01/14/2020 27 6 - 29 U/L Final   SGPT (ALT)  Date Value Ref Range Status  12/20/2012 14 12 - 78 U/L Final          Failed - AST in normal range and within 180 days    AST  Date Value Ref Range Status  01/14/2020 23 10 - 35 U/L Final   SGOT(AST)  Date Value Ref Range Status  12/20/2012 23 15 -  49 Unit/L Final          Passed - Completed PHQ-2 or PHQ-9 in the last 360 days      Passed - Last BP in normal range    BP Readings from Last 1 Encounters:  04/19/20 (!) 123/59          Passed - Valid encounter within last 6 months    Recent Outpatient Visits           3 months ago Major depressive disorder, recurrent, severe w/o psychotic behavior ([place])   [facility] [place], [name], DO   6 months ago [facility] [place], Lupita Raider, FNP   1 year ago Benign hypertension   [place], FNP   1 year ago Leg swelling   [facility], Lupita Raider, FNP   1 year ago Leg swelling   [facility], Lupita Raider, FNP       Future Appointments             In 1 week [name], MD [place]

## 2020-08-08 ENCOUNTER — Ambulatory Visit (INDEPENDENT_AMBULATORY_CARE_PROVIDER_SITE_OTHER): Payer: Medicare Other | Admitting: Licensed Clinical Social Worker

## 2020-08-08 DIAGNOSIS — F339 Major depressive disorder, recurrent, unspecified: Secondary | ICD-10-CM

## 2020-08-08 DIAGNOSIS — J449 Chronic obstructive pulmonary disease, unspecified: Secondary | ICD-10-CM | POA: Diagnosis not present

## 2020-08-08 DIAGNOSIS — I10 Essential (primary) hypertension: Secondary | ICD-10-CM

## 2020-08-08 DIAGNOSIS — F411 Generalized anxiety disorder: Secondary | ICD-10-CM

## 2020-08-09 NOTE — Patient Instructions (Signed)
Visit Information   Goals Addressed               This Visit's Progress     Patient Stated     SW: I'm doing better now with my stress (pt-stated)   On track     Patient Self Care Activities:  Schedule initial appointment with [facility] for med management Call provider office for new concerns or questions Contact office with any questions or concerns Practice deep breathing and grounding strategies to assist with management of symptoms       Other     SW-Track and Manage My Symptoms-Depression   On track     Timeframe:  Long-Range Goal Priority:  Medium Start Date:  04/25/20                          Expected End Date:  09/27/20                 Follow Up Date-08/16/20   Patient Self Care Activities:  Schedule initial appointment with [facility] for med management Call provider office for new concerns or questions Contact office with any questions or concerns Practice deep breathing and grounding strategies to assist with management of symptoms         Patient verbalizes understanding of instructions provided today.   Telephone follow up appointment with care management team member scheduled for:08/16/20  Christa See, MSW, [email or web address] Phone [phone] 4:16 PM

## 2020-08-09 NOTE — Chronic Care Management (AMB) (Signed)
Chronic Care Management    Clinical Social Work Note  08/09/2020 Name: Beth [name] MRN: [id number] DOB: [birth year]  Beth [name] is a 75 y.o. year old female who is a primary care patient of Beth [name], Beth Raider, FNP. The CCM team was consulted to assist the patient with chronic disease management and/or care coordination needs related to: Mental Health Counseling and Resources.   Engaged with patient by telephone for follow up visit in response to provider referral for social work chronic care management and care coordination services.   Consent to Services:  The patient was given information about Chronic Care Management services, agreed to services, and gave verbal consent prior to initiation of services.  Please see initial visit note for detailed documentation.   Patient agreed to services and consent obtained.   Assessment: Patient is engaged in conversation, continues to maintain positive progress with care plan goals. CCM LCSW contacted patient's preferred agency to assist with med management. Staff will visit patient's residence to assist her with completing required paperwork. Patient was appreciative for the assistance. See Care Plan below for interventions and patient self-care actives. Recent life changes Beth [name]: Initiating medication management Recommendation: Patient may benefit from, and is in agreement to work with LCSW to address care coordination needs and will continue to work with the clinical team to address health care and disease management related needs.  Follow up Plan: Patient would like continued follow-up from CCM LCSW .  Follow up scheduled in 08/16/20. Patient will call office if needed prior to next encounter.   SDOH (Social Determinants of Health) assessments and interventions performed:    Advanced Directives Status: Not addressed in this encounter.  CCM Care Plan  Allergies  Allergen Reactions   Bextra  [Valdecoxib]    Compazine  [Prochlorperazine Edisylate]     Stroke-like symptoms   Lithium Carbonate     Leg weakness   Lyrica [Pregabalin]     Outpatient Encounter Medications as of 08/08/2020  Medication Sig   acetaminophen (TYLENOL) 500 MG tablet Take 500 mg by mouth 2 (two) times daily as needed.  (Patient not taking: Reported on 05/25/2020)   Acetaminophen-Codeine 300-15 MG TABS Take by mouth. 1 tablet every 4-6 hours as needed per dentist   aspirin 81 MG chewable tablet Chew 81 mg daily by mouth.   atorvastatin (LIPITOR) 40 MG tablet TAKE 1 TABLET BY MOUTH EVERY DAY   azelastine (ASTELIN) 0.1 % nasal spray Place 2 sprays into both nostrils 2 (two) times daily.   clonazePAM (KLONOPIN) 0.5 MG tablet TAKE 1 TABLET(0.5 MG) BY MOUTH EVERY OTHER DAY AS NEEDED FOR ANXIETY   COMBIVENT RESPIMAT 20-100 MCG/ACT AERS respimat INHALE 1 PUFF BY MOUTH EVERY 6 HOURS   DULoxetine (CYMBALTA) 20 MG capsule TAKE 1 CAPSULE(20 MG) BY MOUTH DAILY   FLUoxetine (PROZAC) 20 MG capsule Take 3 capsules (60 mg total) by mouth daily.   Fluticasone-Umeclidin-Vilant (TRELEGY ELLIPTA) 200-62.5-25 MCG/INH AEPB Inhale 1 puff into the lungs daily.   furosemide (LASIX) 20 MG tablet Take 1 tablet (20 mg total) by mouth daily.   hydrOXYzine (ATARAX/VISTARIL) 25 MG tablet Take 25-50 mg by mouth daily as needed. (Patient not taking: Reported on 05/25/2020)   Melatonin 3 MG CAPS Take 10 mg by mouth at bedtime as needed (sleep).    metoprolol succinate (TOPROL-XL) 25 MG 24 hr tablet TAKE 1/2 TABLET(12.5 MG) BY MOUTH DAILY   montelukast (SINGULAIR) 10 MG tablet Take 1 tablet (10 mg total) by mouth at bedtime.  predniSONE (STERAPRED UNI-PAK 21 TAB) 10 MG (21) TBPK tablet Use as directed   QUEtiapine (SEROQUEL) 100 MG tablet TAKE 1 TABLET(100 MG) BY MOUTH AT BEDTIME   Facility-Administered Encounter Medications as of 08/08/2020  Medication   albuterol (PROVENTIL) (2.5 MG/3ML) 0.083% nebulizer solution 2.5 mg    Patient Active Problem List   Diagnosis  Date Noted   Rash 01/14/2020   Pulmonary hypertension ([place]) 09/14/2019   Coccydynia 07/29/2019   Cervicalgia 05/04/2019   Acute pain of left shoulder 05/04/2019   Elbow pain, left 05/04/2019   DOE (dyspnea on exertion) 03/23/2019   Leg swelling 03/22/2019    Class: Acute   Lumbar degenerative disc disease 09/10/2018   Left hip pain 09/10/2018   Post-traumatic osteoarthritis of left elbow 07/14/2018   Lumbar radiculopathy 02/12/2018   Lumbar spondylosis 02/12/2018   Lumbar facet arthropathy 02/12/2018   Atherosclerosis of aorta ([place]) 09/17/2017   Lung nodule 09/17/2017   Urinary tract infection 12/05/2016   Protein-calorie malnutrition, mild (HCC) 08/07/2016   Generalized anxiety disorder 07/15/2016   Chronic pain syndrome 07/15/2016   Severe episode of recurrent major depressive disorder, without psychotic features ([place]) 07/14/2016   Moderate benzodiazepine use disorder ([place]) 05/08/2016   Major depressive disorder, recurrent, severe w/o psychotic behavior ([place]) 05/01/2016   Seasonal allergic rhinitis 03/23/2015   Hyperglycemia 03/23/2015   Senile purpura ([place]) 03/23/2015   Perennial allergic rhinitis with seasonal variation 03/23/2015   Marital problems 10/31/2014   Migraine without aura and without status migrainosus, not intractable 10/31/2014   Chronic bilateral low back pain without sciatica 08/09/2014   Colon polyp 08/09/2014   COPD, severe ([place]) 08/09/2014   CVA, old, hemiparesis ([place]) 08/09/2014   Dyslipidemia 08/09/2014   Dysfunction of eustachian tube 08/09/2014   Fibromyalgia syndrome 08/09/2014   Gastro-esophageal reflux disease without esophagitis 08/09/2014   Benign migrating glossitis 08/09/2014   Cerebrovascular accident, old 08/09/2014   IBS (irritable bowel syndrome) 08/09/2014   Low back pain with radiation 08/09/2014   Chronic recurrent major depressive disorder ([place]) 31/49/7026   Dysmetabolic syndrome 37/85/8850   OP (osteoporosis) 08/09/2014    Vitamin D deficiency 08/09/2014   Smoking 08/09/2014   Benign hypertension 07/19/2013   Benign neoplasm of skin of trunk 06/03/2013   H/O: pneumonia 09/25/2012    Conditions to be addressed/monitored: Anxiety and Depression; Medication procurement and Mental Health Concerns   Care Plan : General Social Work (Adult)  Updates made by Christa See D, LCSW since 08/09/2020 12:00 AM     Problem: Response to Treatment (Depression)      Long-Range Goal: Response to Treatment Maximized   Start Date: 04/25/2020  This Visit's Progress: On track  Recent Progress: On track  Priority: Medium  Note:   Timeframe:  Long-Range Goal Priority:  Medium Start Date:  04/25/20                          Expected End Date:  09/27/20                 Follow Up Date-08/16/20   Current Barriers:  Chronic Mental Health needs related to depression Financial constraints related to managing health care expenses Limited social support ADL IADL limitations Limited access to caregiver Inability to perform ADL's independently Suicidal Ideation/Homicidal Ideation: No  Clinical Social Work Goal(s):  Over the next 120 days, patient will work with SW bi-monthly by telephone or in person to reduce or manage symptoms related to depression Over the  next 120 days, patient will work with SW to address concerns related to decreasing depression and increasing self-care  Interventions: Patient interviewed and appropriate assessments performed: brief mental health assessment Patient shared that she has not heard back from [employer] to initiate mental health services. Patient provided verbal consent for CCM LCSW to contact agency. CCM LCSW spoke with Lexine Baton and informed her that patient's e-mail is inaccessible. Nikki agreed to have a staff member visit patient's residence to assist with required paperwork to begin services. They will call patient with details CCM LCSW contacted patient and provided update.  She was appreciative for the assistance and agreed with plan CCM LCSW collaborated with [employer] [phone] to inquire about psychiatry appointment. Spoke with Program Director, Elberta Fortis, who reviewed patient's insurance and confirmed that they will schedule an initial intake with patient to begin medication management. Intake documents will be provided electronically for patient to complete, to expedite services, which can be provided through tele-health or in-person  CCM LCSW informed patient to expect a call from [place], the intake specialist. Patient agreed to call and leave a message for a return call, if she is unable to reach them. States she is very motivated to begin services Patient reports that her depression and anxiety symptoms are "level"  Patient agreed to contact CCM LCSW with any update regarding scheduling with [place] Patient is aware of strategies to assist with management of symptoms and agreed to continue practicing breathing exercises to cope Patient is close with son and receives strong support from him. States that she doesn't speak often with daughter; however, children reside locally. She receives emotional support from dog. Patient denies any suicidal/homicidal ideations Collaboration with PCP regarding development and update of comprehensive plan of care as evidenced by provider attestation and co-signature Inter-disciplinary care team collaboration (see longitudinal plan of care) Patient Self Care Activities:  Schedule initial appointment with [facility] for med management Call provider office for new concerns or questions Contact office with any questions or concerns Practice deep breathing and grounding strategies to assist with management of symptoms        Christa See, MSW, [email or web address] Phone  [phone] 4:15 PM

## 2020-08-10 ENCOUNTER — Telehealth: Payer: Self-pay

## 2020-08-10 NOTE — Telephone Encounter (Signed)
Pt is requesting refill of Gabapentin

## 2020-08-11 NOTE — Telephone Encounter (Signed)
She has not had an appointment in 6 months.  Please schedule her an appointment with Dr Holley Raring

## 2020-08-12 ENCOUNTER — Other Ambulatory Visit: Payer: Self-pay | Admitting: Family Medicine

## 2020-08-12 DIAGNOSIS — F332 Major depressive disorder, recurrent severe without psychotic features: Secondary | ICD-10-CM

## 2020-08-12 NOTE — Telephone Encounter (Signed)
Requested Prescriptions  Pending Prescriptions Disp Refills  . DULoxetine (CYMBALTA) 20 MG capsule [Pharmacy Med Name: DULOXETINE DR 20MG  CAPSULES] 30 capsule 1    Sig: TAKE 1 CAPSULE(20 MG) BY MOUTH DAILY     Psychiatry: Antidepressants - SNRI Passed - 08/12/2020 10:18 AM      Passed - Completed PHQ-2 or PHQ-9 in the last 360 days      Passed - Last BP in normal range    BP Readings from Last 1 Encounters:  04/19/20 (!) 123/59         Passed - Valid encounter within last 6 months    Recent Outpatient Visits          3 months ago Major depressive disorder, recurrent, severe w/o psychotic behavior ([place])   [facility], [name], DO   7 months ago [facility] [place], Lupita Raider, FNP   1 year ago Benign hypertension   [place], FNP   1 year ago Leg swelling   [facility], Lupita Raider, FNP   1 year ago Leg swelling   [facility], Lupita Raider, FNP      Future Appointments            In 2 weeks [name], MD [place]

## 2020-08-15 ENCOUNTER — Ambulatory Visit: Payer: Medicare Other | Admitting: Pulmonary Disease

## 2020-08-16 ENCOUNTER — Telehealth: Payer: Self-pay | Admitting: Pharmacist

## 2020-08-16 ENCOUNTER — Telehealth: Payer: Self-pay | Admitting: Licensed Clinical Social Worker

## 2020-08-16 ENCOUNTER — Telehealth: Payer: Self-pay

## 2020-08-16 NOTE — Telephone Encounter (Signed)
    Clinical Social Work  Chronic Care Management   Phone Outreach    08/16/2020 Name: Beth [name] MRN: [id number] DOB: 08/22/1945  Beth [name] is a 75 y.o. year old female who is a primary care patient of Lorine Bears, Lupita Raider, FNP .   F/U phone call today to assess needs, progress and barriers with care plan goals.   Telephone outreach was unsuccessful A HIPPA compliant phone message was left for the patient providing contact information and requesting a return call.   Plan:CCM LCSW will wait for return call.  Review of patient status, including review of consultants reports, relevant laboratory and other test results, and collaboration with appropriate care team members and the patient's provider was performed as part of comprehensive patient evaluation and provision of care management services.    Christa See, MSW, [facility] Care Management [email or web address] Phone [phone] 4:20 PM

## 2020-08-16 NOTE — Telephone Encounter (Signed)
  Chronic Care Management   Outreach Note  08/16/2020 Name: Beth [name] MRN: [id number] DOB: 1945/04/28  Referred by: Verl Bangs, FNP Reason for referral : No chief complaint on file.  Was unable to reach patient via telephone today and have left HIPAA compliant voicemail asking patient to return my call.   Follow Up Plan: Will collaborate with Care Guide to outreach to schedule follow up with me  [name], PharmD, [facility] [phone]

## 2020-08-18 ENCOUNTER — Ambulatory Visit: Payer: Self-pay | Admitting: Pharmacist

## 2020-08-18 ENCOUNTER — Ambulatory Visit: Payer: Self-pay | Admitting: Licensed Clinical Social Worker

## 2020-08-18 ENCOUNTER — Other Ambulatory Visit: Payer: Self-pay | Admitting: Internal Medicine

## 2020-08-18 ENCOUNTER — Telehealth: Payer: Self-pay | Admitting: *Deleted

## 2020-08-18 DIAGNOSIS — I10 Essential (primary) hypertension: Secondary | ICD-10-CM

## 2020-08-18 DIAGNOSIS — F339 Major depressive disorder, recurrent, unspecified: Secondary | ICD-10-CM | POA: Diagnosis not present

## 2020-08-18 DIAGNOSIS — J449 Chronic obstructive pulmonary disease, unspecified: Secondary | ICD-10-CM | POA: Diagnosis not present

## 2020-08-18 DIAGNOSIS — F411 Generalized anxiety disorder: Secondary | ICD-10-CM

## 2020-08-18 DIAGNOSIS — F5104 Psychophysiologic insomnia: Secondary | ICD-10-CM

## 2020-08-18 NOTE — Chronic Care Management (AMB) (Signed)
  Care Management   Note  08/18/2020 Name: CECILY STPETER MRN: [id number] DOB: 04-04-1945  Cresenciano Lick [place] is a 75 y.o. year old female who is a primary care patient of [place], Lupita Raider, FNP and is actively engaged with the care management team. I reached out to [name] by phone today to assist with re-scheduling a follow up visit with the RN Case Manager  Follow up plan: Telephone appointment with care management team member scheduled for:09/04/2020  Yasuo Phimmasone  Care Guide, Embedded Care Coordination [place]  Care Management  Direct Dial: [phone]

## 2020-08-18 NOTE — Chronic Care Management (AMB) (Signed)
  Care Management   Note  08/18/2020 Name: VARINA FRASCH MRN: [id number] DOB: 20-Feb-1945  Cresenciano Lick [place] is a 75 y.o. year old female who is a primary care patient of Lorine Bears, Lupita Raider, FNP and is actively engaged with the care management team. I reached out to [name] by phone today to assist with re-scheduling a follow up visit with the RN Case Manager  Follow up plan: Unsuccessful telephone outreach attempt made. A HIPAA compliant phone message was left for the patient providing contact information and requesting a return call.  The care management team will reach out to the patient again over the next 7 days.  If patient returns call to provider office, please advise to call [place] at [phone].  [place] Management  Direct Dial: [phone]

## 2020-08-18 NOTE — Patient Instructions (Signed)
Visit Information  PATIENT GOALS:  Goals Addressed             This Visit's Progress    Pharmacy Goals       Our goal bad cholesterol, or LDL, is less than 70. This is why it is important to continue taking your atorvastatin  Please check your home blood pressure, keep a log of the results and bring this with you to your medical appointments.  Feel free to call me with any questions or concerns. I look forward to our next call!   [name], PharmD, [place] [phone]        The patient verbalized understanding of instructions, educational materials, and care plan provided today and declined offer to receive copy of patient instructions, educational materials, and care plan.   Telephone follow up appointment with care management team member scheduled for:09/11/2020 at 1:15 PM

## 2020-08-18 NOTE — Chronic Care Management (AMB) (Signed)
Chronic Care Management Pharmacy Note  08/18/2020 Name:  Beth [name] MRN:  [id number] DOB:  1945-12-19   Subjective: Beth [name] is an 75 y.o. year old female who is a primary patient of Malfi, Lupita Raider, FNP.  The CCM team was consulted for assistance with disease management and care coordination needs.    Receive call from patient following up from missed telephone appointment on 7/20.  Engaged with patient by telephone for follow up visit in response to provider referral for pharmacy case management and/or care coordination services.   Consent to Services:  The patient was given information about Chronic Care Management services, agreed to services, and gave verbal consent prior to initiation of services.  Please see initial visit note for detailed documentation.   Patient Care Team: Malfi, Lupita Raider, FNP as PCP - General (Family Medicine) Kate Sable, MD as PCP - Cardiology (Cardiology) [name], MD as Consulting Physician (Pain Medicine) Randel Pigg, MD as Referring Physician (Psychiatry) Shaylie Eklund, Virl Diamond, RPH-CPP (Pharmacist) Vanita Ingles, RN as Case Manager (General Practice) Rebekah Chesterfield, LCSW as Social Worker (Licensed Clinical Social Worker)   Recent consult visits: Office Visit with [employer] on 7/13 for follow up of CKD  Hospital visits: None in previous 6 months  Objective:  Lab Results  Component Value Date   CREATININE 1.00 (H) 01/14/2020   CREATININE 1.21 (H) 08/17/2019   CREATININE 0.95 (H) 03/23/2019       Component Value Date/Time   CHOL 135 04/10/2018 1115   CHOL 118 03/23/2015 1536   CHOL 185 12/25/2012 1449   TRIG 50 04/10/2018 1115   TRIG 93 12/25/2012 1449   HDL 59 04/10/2018 1115   HDL 44 03/23/2015 1536   HDL 39 (L) 12/25/2012 1449   CHOLHDL 2.3 04/10/2018 1115   VLDL 18 03/21/2016 0936   VLDL 19 12/25/2012 1449   [place] 64 04/10/2018 1115   [place] 127 (H) 12/25/2012  1449    Hepatic Function Latest Ref Rng & Units 01/14/2020 08/17/2019 03/23/2019  Total Protein 6.1 - 8.1 g/dL 6.3 7.3 6.4  Albumin 3.5 - 5.0 g/dL - 3.9 -  AST 10 - 35 U/L '23 26 15  ' ALT 6 - 29 U/L '27 25 11  ' Alk Phosphatase 38 - 126 U/L - 98 -  Total Bilirubin 0.2 - 1.2 mg/dL 0.6 0.8 0.5  Bilirubin, Direct 0.1 - 0.5 mg/dL - - -    Social History   Tobacco Use  Smoking Status Every Day   Packs/day: 0.25   Years: 57.00   Pack years: 14.25   Types: Cigarettes  Smokeless Tobacco Never  Tobacco Comments   .25ppd currently   BP Readings from Last 3 Encounters:  04/19/20 (!) 123/59  04/03/20 95/75  03/27/20 128/61   Pulse Readings from Last 3 Encounters:  04/19/20 78  04/03/20 81  03/27/20 90   Wt Readings from Last 3 Encounters:  04/19/20 158 lb 12.8 oz (72 kg)  04/03/20 158 lb (71.7 kg)  03/27/20 160 lb 7.9 oz (72.8 kg)    Assessment: Review of patient past medical history, allergies, medications, health status, including review of consultants reports, laboratory and other test data, was performed as part of comprehensive evaluation and provision of chronic care management services.   SDOH:  (Social Determinants of Health) assessments and interventions performed: none   CCM Care Plan  Allergies  Allergen Reactions   Bextra  [Valdecoxib]    Compazine [Prochlorperazine Edisylate]  Stroke-like symptoms   Lithium Carbonate     Leg weakness   Lyrica [Pregabalin]     Medications Reviewed Today     Reviewed by Vella Raring, RPH-CPP (Pharmacist) on 07/05/20 at 1008  Med List Status: <None>   Medication Order Taking? Sig Documenting Provider Last Dose Status Informant  acetaminophen (TYLENOL) 500 MG tablet [id number]  Take 500 mg by mouth 2 (two) times daily as needed.   Patient not taking: Reported on 05/25/2020   [provider]  Active Pharmacy Records  Acetaminophen-Codeine 300-15 MG TABS [id number]  Take by mouth. 1 tablet every 4-6 hours as  needed per dentist [provider]  Active   albuterol (PROVENTIL) (2.5 MG/3ML) 0.083% nebulizer solution 2.5 mg [id number]   Fredderick Severance, NP  Active   aspirin 81 MG chewable tablet [id number]  Chew 81 mg daily by mouth. [provider]  Active Pharmacy Records           Med Note Francene Finders   Wed Dec 18, 2016  4:14 PM)    atorvastatin (LIPITOR) 40 MG tablet [id number]  TAKE 1 TABLET BY MOUTH EVERY DAY Malfi, Lupita Raider, FNP  Active   azelastine (ASTELIN) 0.1 % nasal spray [id number]  Place 2 sprays into both nostrils 2 (two) times daily. Mikey College, NP  Active   clonazePAM (KLONOPIN) 0.5 MG tablet [id number]  TAKE 1 TABLET(0.5 MG) BY MOUTH EVERY OTHER DAY AS NEEDED FOR ANXIETY Jearld Fenton, NP  Active   COMBIVENT RESPIMAT 20-100 MCG/ACT AERS respimat [id number]  INHALE 1 PUFF BY MOUTH EVERY 6 HOURS [name], DO  Active   DULoxetine (CYMBALTA) 20 MG capsule [id number]  TAKE 1 CAPSULE(20 MG) BY MOUTH DAILY Parks Ranger, [name], DO  Active   FLUoxetine (PROZAC) 20 MG capsule [id number]  Take 3 capsules (60 mg total) by mouth daily. [name], DO  Active   Fluticasone-Umeclidin-Vilant (TRELEGY ELLIPTA) 200-62.5-25 MCG/INH AEPB [id number] Yes Inhale 1 puff into the lungs daily. Parrett, Fonnie Mu, NP Taking Active   furosemide (LASIX) 20 MG tablet [id number]  Take 1 tablet (20 mg total) by mouth daily. Jearld Fenton, NP  Active   gabapentin (NEURONTIN) 300 MG capsule [id number]  Take 2 capsules (600 mg total) by mouth 3 (three) times daily. [name], MD  Expired 06/11/20 2359            Med Note [facility], Daleigh Pollinger A   Wed Feb 02, 2020  5:33 PM) Reports taking 2 capsules (600 mg) twice daily  hydrOXYzine (ATARAX/VISTARIL) 25 MG tablet [id number]  Take 25-50 mg by mouth daily as needed.  Patient not taking: Reported on 05/25/2020   [provider]  Active   Melatonin 3 MG CAPS [id number]  Take 10 mg by mouth at bedtime as  needed (sleep).  [provider]  Active Self  metoprolol succinate (TOPROL-XL) 25 MG 24 hr tablet [id number]  TAKE 1/2 TABLET(12.5 MG) BY MOUTH DAILY Baity, Coralie Keens, NP  Active   montelukast (SINGULAIR) 10 MG tablet [id number]  Take 1 tablet (10 mg total) by mouth at bedtime. [name], DO  Active   QUEtiapine (SEROQUEL) 100 MG tablet [id number]  TAKE 1 TABLET(100 MG) BY MOUTH AT BEDTIME [name], DO  Active   Med List Note Rise Patience, RN 02/12/18 1055): Medication agreement signed 02/12/2018 UDS 02/24/17 03/25/17 PA requested for gabapentin sent SB             Patient  Active Problem List   Diagnosis Date Noted   Rash 01/14/2020   Pulmonary hypertension ([place]) 09/14/2019   Coccydynia 07/29/2019   Cervicalgia 05/04/2019   Acute pain of left shoulder 05/04/2019   Elbow pain, left 05/04/2019   DOE (dyspnea on exertion) 03/23/2019   Leg swelling 03/22/2019    Class: Acute   Lumbar degenerative disc disease 09/10/2018   Left hip pain 09/10/2018   Post-traumatic osteoarthritis of left elbow 07/14/2018   Lumbar radiculopathy 02/12/2018   Lumbar spondylosis 02/12/2018   Lumbar facet arthropathy 02/12/2018   Atherosclerosis of aorta ([place]) 09/17/2017   Lung nodule 09/17/2017   Urinary tract infection 12/05/2016   Protein-calorie malnutrition, mild (HCC) 08/07/2016   Generalized anxiety disorder 07/15/2016   Chronic pain syndrome 07/15/2016   Severe episode of recurrent major depressive disorder, without psychotic features ([place]) 07/14/2016   Moderate benzodiazepine use disorder ([place]) 05/08/2016   Major depressive disorder, recurrent, severe w/o psychotic behavior ([place]) 05/01/2016   Seasonal allergic rhinitis 03/23/2015   Hyperglycemia 03/23/2015   Senile purpura ([place]) 03/23/2015   Perennial allergic rhinitis with seasonal variation 03/23/2015   Marital problems 10/31/2014   Migraine without aura and without status migrainosus, not  intractable 10/31/2014   Chronic bilateral low back pain without sciatica 08/09/2014   Colon polyp 08/09/2014   COPD, severe ([place]) 08/09/2014   CVA, old, hemiparesis ([place]) 08/09/2014   Dyslipidemia 08/09/2014   Dysfunction of eustachian tube 08/09/2014   Fibromyalgia syndrome 08/09/2014   Gastro-esophageal reflux disease without esophagitis 08/09/2014   Benign migrating glossitis 08/09/2014   Cerebrovascular accident, old 08/09/2014   IBS (irritable bowel syndrome) 08/09/2014   Low back pain with radiation 08/09/2014   Chronic recurrent major depressive disorder ([place]) 47/82/9562   Dysmetabolic syndrome 13/08/6576   OP (osteoporosis) 08/09/2014   Vitamin D deficiency 08/09/2014   Smoking 08/09/2014   Benign hypertension 07/19/2013   Benign neoplasm of skin of trunk 06/03/2013   H/O: pneumonia 09/25/2012    Immunization History  Administered Date(s) Administered   Influenza Split 11/13/2006, 11/02/2008   Influenza, High Dose Seasonal PF 09/27/2014, 09/29/2015, 12/30/2016, 10/15/2018, 11/05/2019   Influenza, Seasonal, Injecte, Preservative Fre 11/12/2010   Influenza,inj,Quad PF,6+ Mos 10/08/2012   Influenza-Unspecified 04/08/2014   PFIZER(Purple Top)SARS-COV-2 Vaccination 03/14/2019, 04/09/2019   PPD Test 05/16/2016   Pneumococcal Conjugate-13 09/27/2014   Pneumococcal Polysaccharide-23 06/12/2010   Tdap 03/19/2007, 09/29/2015   Zoster, Live 04/16/2012    Conditions to be addressed/monitored: HTN, HLD and COPD  Care Plan : PharmD - Med Mgmt  Updates made by Vella Raring, RPH-CPP since 08/18/2020 12:00 AM     Problem: Disease Progression      Long-Range Goal: Disease Progression Prevented or Minimized   Start Date: 04/10/2020  Expected End Date: 07/09/2020  This Visit's Progress: On track  Recent Progress: On track  Priority: High  Note:   Current Barriers:  Chronic Disease Management support, education, and care coordination needs related to previous CVA,  COPD, hypertension, hyperlipidemia, depression  Pharmacist Clinical Goal(s):  Over the next 90 days, patient will achieve adherence to monitoring guidelines and medication adherence to achieve therapeutic efficacy through collaboration with PharmD and provider.   Interventions: 1:1 collaboration with Webb Silversmith, NP regarding development and update of comprehensive plan of care as evidenced by provider attestation and co-signature Inter-disciplinary care team collaboration (see longitudinal plan of care) Receive call from patient following up from missed telephone appointment on 7/20. Perform chart review Office Visit with [facility] on 7/13 for  follow up of CKD  Moderate to Severe COPD:  Patient followed by [facility] in [place] Note per telephone note on 6/8, patient followed up with [facility] regarding her cough and was prescribed Z-pak and prednisone course Reports completed courses of both as directed and reports symptoms resolved. Current treatment: Trelegy 1 puff daily , rinse after use Patient confirms rinsing after use.  Combivent as needed  Hypertension Reports taking: Furosemide 20 mg daily Metoprolol ER 25 mg - 1/2 tablet (12.5 mg ) daily Today patient requests assistance with obtaining "Lasix" Rx to take as directed by Nephrologist Counsel patient that Lasix is the brand name for furosemide. Patient verbalizes understanding and confirms taking furosemide Rx as directed by Nephrologist Note patient has home upper BP monitor Reports has been monitoring home BP and keeping log, but unable to locate log today Encourage patient to continue monitoring, keep log of BP and bring record to medical appointments Encourage patient to monitor weight at home as directed by Nephrologist Patient reports has home scale, but out of battery. Encourage patient to obtain battery and resume monitoring  Coordination of Care:  Reports has  been working with CCM Education officer, museum to try to get established with Psychiatrist at [facility], but has not heard back from [facility] about an appointment Reports planning to call CCM Social Worker back today Note patient missed appointment with CCM Social Worker on 7/20 Reports will also follow up with [employer] with CCM Social Worker today  Patient Goals/Self-Care Activities Over the next 90 days, patient will:  To self administers medications as prescribed Encourage patient to obtain weekly pillbox to use as adherence tool Attends all scheduled provider appointments Next appointment with pain management on 7/25 Next appointment with Pulmonology on 8/1 Calls pharmacy for medication refills Calls provider office for new concerns or questions  Follow Up Plan: Telephone follow up appointment with care management team member as previously scheduled for: 09/11/2020 at 1:15 PM       Patient's preferred pharmacy is:  [id number] [address], [address] [place] [address] Phone: [phone] Fax: [phone]   Follow Up:  Patient agrees to Care Plan and Follow-up.  Wallace Cullens, PharmD, Para March, CPP Clinical Pharmacist [facility] [phone]

## 2020-08-21 ENCOUNTER — Ambulatory Visit
Admission: RE | Admit: 2020-08-21 | Discharge: 2020-08-21 | Disposition: A | Discharge status: Home / Self Care | Payer: Medicare Other | Source: Ambulatory Visit | Attending: Student in an Organized Health Care Education/Training Program | Admitting: Student in an Organized Health Care Education/Training Program

## 2020-08-21 ENCOUNTER — Ambulatory Visit (HOSPITAL_BASED_OUTPATIENT_CLINIC_OR_DEPARTMENT_OTHER): Payer: Medicare Other | Admitting: Student in an Organized Health Care Education/Training Program

## 2020-08-21 ENCOUNTER — Encounter: Payer: Self-pay | Admitting: Student in an Organized Health Care Education/Training Program

## 2020-08-21 VITALS — BP 124/68 | HR 85 | Temp 97.3°F | Resp 18 | Ht 65.0 in | Wt 158.0 lb

## 2020-08-21 DIAGNOSIS — M47816 Spondylosis without myelopathy or radiculopathy, lumbar region: Secondary | ICD-10-CM

## 2020-08-21 DIAGNOSIS — M25522 Pain in left elbow: Secondary | ICD-10-CM | POA: Insufficient documentation

## 2020-08-21 DIAGNOSIS — G894 Chronic pain syndrome: Secondary | ICD-10-CM | POA: Diagnosis not present

## 2020-08-21 MED ORDER — HYDROCODONE-ACETAMINOPHEN 5-325 MG PO TABS: 1.0000 | ORAL_TABLET | Freq: Three times a day (TID) | ORAL | 0 refills | Status: AC | PRN

## 2020-08-21 MED ORDER — GABAPENTIN 300 MG PO CAPS: 600.0000 mg | ORAL_CAPSULE | Freq: Three times a day (TID) | ORAL | 5 refills | Status: DC

## 2020-08-21 NOTE — Patient Instructions (Signed)
____________________________________________________________________________________________  Preparing for Procedure with Sedation  Procedure appointments are limited to planned procedures: No Prescription Refills. No disability issues will be discussed. No medication changes will be discussed.  Instructions: Oral Intake: Do not eat or drink anything for at least 8 hours prior to your procedure. (Exception: Blood Pressure Medication. See below.) Transportation: Unless otherwise stated by your physician, you may drive yourself after the procedure. Blood Pressure Medicine: Do not forget to take your blood pressure medicine with a sip of water the morning of the procedure. If your Diastolic (lower reading)is above 100 mmHg, elective cases will be cancelled/rescheduled. Blood thinners: These will need to be stopped for procedures. Notify our staff if you are taking any blood thinners. Depending on which one you take, there will be specific instructions on how and when to stop it. Diabetics on insulin: Notify the staff so that you can be scheduled 1st case in the morning. If your diabetes requires high dose insulin, take only  of your normal insulin dose the morning of the procedure and notify the staff that you have done so. Preventing infections: Shower with an antibacterial soap the morning of your procedure. Build-up your immune system: Take 1000 mg of Vitamin C with every meal (3 times a day) the day prior to your procedure. Antibiotics: Inform the staff if you have a condition or reason that requires you to take antibiotics before dental procedures. Pregnancy: If you are pregnant, call and cancel the procedure. Sickness: If you have a cold, fever, or any active infections, call and cancel the procedure. Arrival: You must be in the facility at least 30 minutes prior to your scheduled procedure. Children: Do not bring children with you. Dress appropriately: Bring dark clothing that you would  not mind if they get stained. Valuables: Do not bring any jewelry or valuables.  Reasons to call and reschedule or cancel your procedure: (Following these recommendations will minimize the risk of a serious complication.) Surgeries: Avoid having procedures within 2 weeks of any surgery. (Avoid for 2 weeks before or after any surgery). Flu Shots: Avoid having procedures within 2 weeks of a flu shots or . (Avoid for 2 weeks before or after immunizations). Barium: Avoid having a procedure within 7-10 days after having had a radiological study involving the use of radiological contrast. (Myelograms, Barium swallow or enema study). Heart attacks: Avoid any elective procedures or surgeries for the initial 6 months after a "Myocardial Infarction" (Heart Attack). Blood thinners: It is imperative that you stop these medications before procedures. Let us know if you if you take any blood thinner.  Infection: Avoid procedures during or within two weeks of an infection (including chest colds or gastrointestinal problems). Symptoms associated with infections include: Localized redness, fever, chills, night sweats or profuse sweating, burning sensation when voiding, cough, congestion, stuffiness, runny nose, sore throat, diarrhea, nausea, vomiting, cold or Flu symptoms, recent or current infections. It is specially important if the infection is over the area that we intend to treat. Heart and lung problems: Symptoms that may suggest an active cardiopulmonary problem include: cough, chest pain, breathing difficulties or shortness of breath, dizziness, ankle swelling, uncontrolled high or unusually low blood pressure, and/or palpitations. If you are experiencing any of these symptoms, cancel your procedure and contact your primary care physician for an evaluation.  Remember:  Regular Business hours are:  Monday to Thursday 8:00 AM to 4:00 PM  Provider's Schedule: Francisco Naveira, MD:  Procedure days: Tuesday and  Thursday 7:30 AM   to 4:00 PM  [name], MD:  Procedure days: Monday and Wednesday 7:30 AM to 4:00 PM ____________________________________________________________________________________________ Radiofrequency Lesioning Radiofrequency lesioning is a procedure that is performed to relieve pain. The procedure is often used for back, neck, or arm pain. Radiofrequency lesioning involves the use of a machine that creates radio waves to make heat. During the procedure, the heat is applied to the nerve that carries the pain signal. The heat damages the nerve and interferes with the pain signal. Pain relief usuallystarts about 2 weeks after the procedure and lasts for 6 months to 1 year. You will be awake during the procedure. You will need to be able to talk withthe health care provider during the procedure. Tell a health care provider about: Any allergies you have. All medicines you are taking, including vitamins, herbs, eye drops, creams, and over-the-counter medicines. Any problems you or family members have had with anesthetic medicines. Any blood disorders you have. Any surgeries you have had. Any medical conditions you have or have had. Whether you are pregnant or may be pregnant. What are the risks? Generally, this is a safe procedure. However, problems may occur, including: Pain or soreness at the injection site. Allergic reaction to medicines given during the procedure. Bleeding. Infection at the injection site. Damage to nerves or blood vessels. What happens before the procedure? Staying hydrated Follow instructions from your health care provider about hydration, which may include: Up to 2 hours before the procedure - you may continue to drink clear liquids, such as water, clear fruit juice, black coffee, and plain tea. Eating and drinking Follow instructions from your health care provider about eating and drinking, which may include: 8 hours before the procedure - stop eating heavy  meals or foods, such as meat, fried foods, or fatty foods. 6 hours before the procedure - stop eating light meals or foods, such as toast or cereal. 6 hours before the procedure - stop drinking milk or drinks that contain milk. 2 hours before the procedure - stop drinking clear liquids. Medicines Ask your health care provider about: Changing or stopping your regular medicines. This is especially important if you are taking diabetes medicines or blood thinners. Taking medicines such as aspirin and ibuprofen. These medicines can thin your blood. Do not take these medicines unless your health care provider tells you to take them. Taking over-the-counter medicines, vitamins, herbs, and supplements. General instructions Plan to have someone take you home from the hospital or clinic. If you will be going home right after the procedure, plan to have someone with you for 24 hours. Ask your health care provider what steps will be taken to help prevent infection. These may include: Removing hair at the procedure site. Washing skin with a germ-killing soap. Taking antibiotic medicine. What happens during the procedure?  An IV will be inserted into one of your veins. You will be given one or more of the following: A medicine to help you relax (sedative). A medicine to numb the area (local anesthetic). Your health care provider will insert a radiofrequency needle into the area to be treated. This is done with the help of a type of X-ray (fluoroscopy). A wire that carries the radio waves (electrode) will be put through the radiofrequency needle. An electrical pulse will be sent through the electrode to verify the correct nerve that is causing your pain. You will feel a tingling sensation, and you may have muscle twitching. The tissue around the needle tip will be heated by  an electric current that comes from the radiofrequency machine. This will numb the nerves. The needle will be removed. A bandage  (dressing) will be put on the insertion area. The procedure may vary among health care providers and hospitals. What happens after the procedure? Your blood pressure, heart rate, breathing rate, and blood oxygen level will be monitored until you leave the hospital or clinic. Return to your normal activities as told by your health care provider. Ask your health care provider what activities are safe for you. Do not drive for 24 hours if you were given a sedative during your procedure. Summary Radiofrequency lesioning is a procedure that is performed to relieve pain. The procedure is often used for back, neck, or arm pain. Radiofrequency lesioning involves the use of a machine that creates radio waves to make heat. Plan to have someone take you home from the hospital or clinic. Do not drive for 24 hours if you were given a sedative during your procedure. Return to your normal activities as told by your health care provider. Ask your health care provider what activities are safe for you. This information is not intended to replace advice given to you by your health care provider. Make sure you discuss any questions you have with your healthcare provider. Document Revised: 10/02/2017 Document Reviewed: 10/02/2017 Elsevier Patient Education  [place].

## 2020-08-21 NOTE — Progress Notes (Signed)
Safety precautions to be maintained throughout the outpatient stay will include: orient to surroundings, keep bed in low position, maintain call bell within reach at all times, provide assistance with transfer out of bed and ambulation.  

## 2020-08-21 NOTE — Progress Notes (Signed)
PROVIDER NOTE: Information contained herein reflects review and annotations entered in association with encounter. Interpretation of such information and data should be left to medically-trained personnel. Information provided to patient can be located elsewhere in the medical record under "Patient Instructions". Document created using STT-dictation technology, any transcriptional errors that may result from process are unintentional.    Patient: Beth [name]  Service Category: E/M  Provider: [name], MD  DOB: [birth year]  DOS: 08/21/2020  Specialty: Interventional Pain Management  MRN: [id number]  Setting: Ambulatory outpatient  PCP: Verl Bangs, FNP  Type: Established Patient    Referring Provider: Verl Bangs, FNP  Location: Office  Delivery: Face-to-face     HPI  [name], a 75 y.o. year old female, is here today because of her Lumbar facet arthropathy [M47.816]. [name]'s primary complain today is Back Pain Last encounter: My last encounter with her was on 02/03/20 Pertinent problems: [name] has Fibromyalgia syndrome; Lumbar spondylosis; Lumbar facet arthropathy; Post-traumatic osteoarthritis of left elbow; Lumbar degenerative disc disease; Elbow pain, left; and Coccydynia on their pertinent problem list. Pain Assessment: Severity of Chronic pain is reported as a 6 /10. Location: Back Lower, Right, Left/Radiates to hips bilateral. Onset: More than a month ago. Quality: Contraction ("terrible and depp"). Timing: Constant. Modifying factor(s): Gabapentin and tylenol. Vitals:  height is '5\' 5"'  (1.651 m) and weight is 158 lb (71.7 kg). Her temporal temperature is 97.3 F (36.3 C) (abnormal). Her blood pressure is 124/68 and her pulse is 85. Her respiration is 18 and oxygen saturation is 95%.   Reason for encounter: worsening of previously known (established) problem  [name] ablations today for increased low back pain secondary to lumbar facet arthropathy,  lumbar facet joint syndrome. She is status post series of 3 lumbar facet medial branch nerve blocks which provided her with 75% pain relief for approximately 2 weeks.  She went on to have her right L3, L4, L5 RFA performed on 07/07/2019 She was unable to have her left side done as she was sick and was unable to follow-up for her procedure. Given return of low back pain that is impacting her ADLs and quality of life, discussed repeating lumbar radiofrequency ablation for the right side and doing it first time for the left side. Patient is requesting a small quantity of hydrocodone to help with her increased low back pain. I will also refill her gabapentin as below. She is also complaining of left elbow pain and swelling.  No inciting or traumatic event.  She has had left elbow surgery in the past.  Will obtain x-ray.  Pharmacotherapy Assessment  Analgesic: Small quantity of hydrocodone 5 mg twice daily as needed given increased low back pain related to lumbar facet arthropathy.   Monitoring: [place] PMP: PDMP reviewed during this encounter.       Pharmacotherapy: No side-effects or adverse reactions reported. Compliance: No problems identified. Effectiveness: Clinically acceptable.  Janne Napoleon, RN  08/21/2020  1:17 PM  Sign when Signing Visit   Safety precautions to be maintained throughout the outpatient stay will include: orient to surroundings, keep bed in low position, maintain call bell within reach at all times, provide assistance with transfer out of bed and ambulation.          UDS:  Summary  Date Value Ref Range Status  02/24/2017 FINAL  Final    Comment:    ==================================================================== [facility] COMP DRUG ANALYSIS,UR ==================================================================== Test  Result       Flag       Units Drug Present and Declared for Prescription Verification   Gabapentin                      PRESENT      EXPECTED   Aripiprazole                   PRESENT      EXPECTED   Metoprolol                     PRESENT      EXPECTED Drug Present not Declared for Prescription Verification   7-aminoclonazepam              368          UNEXPECTED ng/mg creat    7-aminoclonazepam is an expected metabolite of clonazepam. Source    of clonazepam is a scheduled prescription medication.   Citalopram                     PRESENT      UNEXPECTED   Desmethylcitalopram            PRESENT      UNEXPECTED    Desmethylcitalopram is an expected metabolite of citalopram or    the enantiomeric form, escitalopram.   Hydroxyzine                    PRESENT      UNEXPECTED Drug Absent but Declared for Prescription Verification   Duloxetine                     Not Detected UNEXPECTED   Acetaminophen                  Not Detected UNEXPECTED    Acetaminophen, as indicated in the declared medication list, is    not always detected even when used as directed.   Salicylate                     Not Detected UNEXPECTED    Aspirin, as indicated in the declared medication list, is not    always detected even when used as directed. ==================================================================== Test                      Result    Flag   Units      Ref Range   Creatinine              62               mg/dL      >=20 ==================================================================== Declared Medications:  The flagging and interpretation on this report are based on the  following declared medications.  Unexpected results may arise from  inaccuracies in the declared medications.  **Note: The testing scope of this panel includes these medications:  Aripiprazole  Duloxetine  Gabapentin  Metoprolol  **Note: The testing scope of this panel does not include small to  moderate amounts of these reported medications:  Acetaminophen  Aspirin (Aspirin 81)  **Note: The testing scope of this panel does not include  following  reported medications:  Albuterol (Ipratropium-Albuterol)  Atorvastatin  Azelastine  Fluticasone  Fluticasone (Breo)  Ipratropium (Ipratropium-Albuterol)  Multivitamin (MVI)  Vilanterol (Breo) ==================================================================== For clinical consultation, please call [phone]. ====================================================================      ROS  Constitutional:  Denies any fever or chills Gastrointestinal: No reported hemesis, hematochezia, vomiting, or acute GI distress Musculoskeletal:  Increased low back pain, pain with facet loading Neurological: No reported episodes of acute onset apraxia, aphasia, dysarthria, agnosia, amnesia, paralysis, loss of coordination, or loss of consciousness  Medication Review  DULoxetine, FLUoxetine, HYDROcodone-acetaminophen, Melatonin, QUEtiapine, acetaminophen, aspirin, atorvastatin, clonazePAM, furosemide, gabapentin, hydrOXYzine, metoprolol succinate, and montelukast  History Review  Allergy: Ms. Flores is allergic to bextra  [valdecoxib], compazine [prochlorperazine edisylate], lithium carbonate, and lyrica [pregabalin]. Drug: Ms. Thornell  reports no history of drug use. Alcohol:  reports no history of alcohol use. Tobacco:  reports that she has been smoking cigarettes. She has a 14.25 pack-year smoking history. She has never used smokeless tobacco. Social: Ms. Ilg  reports that she has been smoking cigarettes. She has a 14.25 pack-year smoking history. She has never used smokeless tobacco. She reports that she does not drink alcohol and does not use drugs. Medical:  has a past medical history of Allergy, Anxiety, Asthma, Chronic pain syndrome, COPD (chronic obstructive pulmonary disease) ([place]), CVA (cerebral infarction), Depression, Fibromyalgia, Headache, Hyperlipidemia, Hypertension, IBS (irritable bowel syndrome), Stroke ([place]), and Vitamin D deficiency. Surgical: Ms.  Moffitt  has a past surgical history that includes Breast surgery (2011); Cervical discectomy; Appendectomy; Abdominal hysterectomy; Cholecystectomy; Sinusotomy; and Breast excisional biopsy (2011). Family: family history includes Alcohol abuse in her father; Anxiety disorder in her mother; Breast cancer (age of onset: 54) in her mother; Cancer in her father; Depression in her father and mother; Gallbladder disease in her father.  Laboratory Chemistry Profile   Renal Lab Results  Component Value Date   BUN 28 (H) 01/14/2020   CREATININE 1.00 (H) 01/14/2020   BCR 28 (H) 01/14/2020   GFRAA 63 01/14/2020   GFRNONAA 55 (L) 01/14/2020    Hepatic Lab Results  Component Value Date   AST 23 01/14/2020   ALT 27 01/14/2020   ALBUMIN 3.9 08/17/2019   ALKPHOS 98 08/17/2019   LIPASE 27 02/13/2016    Electrolytes Lab Results  Component Value Date   NA 143 01/14/2020   K 4.0 01/14/2020   CL 103 01/14/2020   CALCIUM 9.5 01/14/2020   MG 1.8 12/25/2012    Bone Lab Results  Component Value Date   VD25OH 37 03/21/2016    Inflammation (CRP: Acute Phase) (ESR: Chronic Phase) Lab Results  Component Value Date   CRP 0.3 03/21/2016   ESRSEDRATE 1 03/21/2016   LATICACIDVEN 1.3 12/23/2015         Note: Above Lab results reviewed.  Recent Imaging Review  CT CHEST LCS NODULE F/U W/O CONTRAST CLINICAL DATA:  75 year old female with 71 pack-year history of smoking. Lung cancer screening.  EXAM: CT CHEST WITHOUT CONTRAST FOR LUNG CANCER SCREENING NODULE FOLLOW-UP  TECHNIQUE: Multidetector CT imaging of the chest was performed following the standard protocol without IV contrast.  COMPARISON:  09/29/2019  FINDINGS: Cardiovascular: The heart size is normal. No substantial pericardial effusion. Coronary artery calcification is evident. Atherosclerotic calcification is noted in the wall of the thoracic aorta.  Mediastinum/Nodes: No mediastinal lymphadenopathy. No evidence for gross  hilar lymphadenopathy although assessment is limited by the lack of intravenous contrast on today's study. The esophagus has normal imaging features. There is no axillary lymphadenopathy.  Lungs/Pleura: Centrilobular and paraseptal emphysema evident. Scattered tiny bilateral pulmonary nodules identified previously are stable in the interval. There is a new 4 mm nodule in the right upper lobe (image 80). No focal airspace consolidation. No pleural effusion.  Upper Abdomen: 2.4 cm lesion interpolar right kidney is been incompletely visualized but measures water density and is likely a cyst. Appearance is similar to study from 09/17/2017. 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  Musculoskeletal: No worrisome lytic or sclerotic osseous abnormality. Stable superior endplate compression fracture at T12.  IMPRESSION: 1. Lung-RADS 3, probably benign findings. New 4 mm right upper lobe pulmonary nodule. Short-term follow-up in 6 months is recommended with repeat low-dose chest CT without contrast (please use the following order, "CT CHEST LCS NODULE FOLLOW-UP W/O CM"). 2. Emphysema (ICD10-J43.9) and Aortic Atherosclerosis (ICD10-170.0)  Electronically Signed   By: Misty Stanley M.D.   On: 07/06/2020 15:27 Note: Reviewed        Physical Exam  General appearance: Well nourished, well developed, and well hydrated. In no apparent acute distress Mental status: Alert, oriented x 3 (person, place, & time)       Respiratory: No evidence of acute respiratory distress Eyes: PERLA Vitals: BP 124/68   Pulse 85   Temp (!) 97.3 F (36.3 C) (Temporal)   Resp 18   Ht '5\' 5"'  (1.651 m)   Wt 158 lb (71.7 kg)   SpO2 95%   BMI 26.29 kg/m  BMI: Estimated body mass index is 26.29 kg/m as calculated from the following:   Height as of this encounter: '5\' 5"'  (1.651 m).   Weight as of this encounter: 158 lb (71.7 kg). Ideal: Ideal body weight: 57 kg (125 lb  10.6 oz) Adjusted ideal body weight: 62.9 kg (138 lb 9.6 oz)  Left elbow pain, mild swelling  Lumbar Spine Area Exam  Skin & Axial Inspection: No masses, redness, or swelling Alignment: Symmetrical Functional ROM: Pain restricted ROM       Stability: No instability detected Muscle Tone/Strength: Functionally intact. No obvious neuro-muscular anomalies detected. Sensory (Neurological): Musculoskeletal pain pattern Palpation: No palpable anomalies       Provocative Tests: Hyperextension/rotation test: (+) bilaterally for facet joint pain. Lumbar quadrant test (Kemp's test): (+) bilaterally for facet joint pain.   Lower Extremity Exam    Side: Right lower extremity  Side: Left lower extremity  Stability: No instability observed          Stability: No instability observed          Skin & Extremity Inspection: Skin color, temperature, and hair growth are WNL. No peripheral edema or cyanosis. No masses, redness, swelling, asymmetry, or associated skin lesions. No contractures.  Skin & Extremity Inspection: Skin color, temperature, and hair growth are WNL. No peripheral edema or cyanosis. No masses, redness, swelling, asymmetry, or associated skin lesions. No contractures.  Functional ROM: Pain restricted ROM                  Functional ROM: Pain restricted ROM                  Muscle Tone/Strength: Functionally intact. No obvious neuro-muscular anomalies detected.  Muscle Tone/Strength: Functionally intact. No obvious neuro-muscular anomalies detected.  Sensory (Neurological): Unimpaired        Sensory (Neurological): Unimpaired        DTR: Patellar: deferred today Achilles: deferred today Plantar: deferred today  DTR: Patellar: deferred today Achilles: deferred today Plantar: deferred today  Palpation: No palpable anomalies  Palpation: No palpable anomalies    Assessment   Status Diagnosis  Having a Flare-up Having a Flare-up Having a Flare-up 1. Lumbar facet arthropathy   2.  Lumbar  spondylosis   3. Elbow pain, left   4. Chronic pain syndrome      Updated Problems: Problem  Coccydynia  Elbow Pain, Left  Lumbar Degenerative Disc Disease  Post-Traumatic Osteoarthritis of Left Elbow  Lumbar Spondylosis  Lumbar Facet Arthropathy  Fibromyalgia Syndrome    Plan of Care    [name] has a current medication list which includes the following long-term medication(s): atorvastatin, clonazepam, duloxetine, fluoxetine, furosemide, metoprolol succinate, montelukast, quetiapine, and gabapentin.  1.  Repeat lumbar radiofrequency ablation for pain related to lumbar facet arthropathy.  Start with right side first followed 2 weeks later by left side. 2.  Refill gabapentin as below.  No change in dose.  Small quantity of hydrocodone given worsening baseline chronic low back pain.  This should help manage her pain until we are able to do her RFA. 3.  X-ray of left elbow given left elbow pain and swelling  Pharmacotherapy (Medications Ordered): Meds ordered this encounter  Medications   gabapentin (NEURONTIN) 300 MG capsule    Sig: Take 2 capsules (600 mg total) by mouth 3 (three) times daily.    Dispense:  180 capsule    Refill:  5   HYDROcodone-acetaminophen (NORCO) 5-325 MG tablet    Sig: Take 1-2 tablets by mouth every 8 (eight) hours as needed for up to 15 days for moderate pain.    Dispense:  30 tablet    Refill:  0   Orders:  Orders Placed This Encounter  Procedures   Radiofrequency,Lumbar    Standing Status:   Future    Standing Expiration Date:   08/21/2021    Scheduling Instructions:     Side(s): RIGHT     Level(s): L3, L4, L5, Medial Branch Nerve(s)     Sedation: With Sedation     Scheduling Timeframe: As soon as pre-approved    Order Specific Question:   Where will this procedure be performed?    Answer:   [facility]   Radiofrequency,Lumbar    Standing Status:   Future    Standing Expiration Date:   08/21/2021    Scheduling  Instructions:     Side(s): Left-sided     Level: L3-4, L4-5,Facets (L3, L4, L5, Medial Branch Nerves)     Sedation: Patient's choice.     Scheduling Timeframe: 2 weeks after right    Order Specific Question:   Where will this procedure be performed?    Answer:   [facility]   DG Elbow 2 Views Left    Standing Status:   Future    Number of Occurrences:   1    Standing Expiration Date:   08/21/2021    Order Specific Question:   Reason for Exam (SYMPTOM  OR DIAGNOSIS REQUIRED)    Answer:   elbow pain    Order Specific Question:   Preferred imaging location?    Answer:   [facility]   Follow-up plan:   Return in about 1 week (around 08/28/2020) for Right L3, 4, 5 RFA , with sedation.     Status post bilateral facet medial branch nerve blocks L3, L4, L5 on 09/03/2017, helpful for axial low back and buttock pain: Repeat PRN.  Status post right L4-L5 epidural steroid injection on 03/09/2018, 07/29/2018 which was helpful for her right-sided hip and leg pain.  Right L4-L5 ESI #3 on 09/23/2027 not as helpful.  Right L3, L4, L5 RFA on 07/07/2019          Recent  Visits No visits were found meeting these conditions. Showing recent visits within past 90 days and meeting all other requirements Today's Visits Date Type Provider Dept  08/21/20 Office Visit [name], MD [facility]  Showing today's visits and meeting all other requirements Future Appointments Date Type Provider Dept  08/30/20 Appointment [name], MD [facility]  09/13/20 Appointment [name], MD [facility]  Showing future appointments within next 90 days and meeting all other requirements I discussed the assessment and treatment plan with the patient. The patient was provided an opportunity to ask questions and all were answered. The patient agreed with the plan and demonstrated an understanding of the instructions.  Patient advised to call back or seek an in-person evaluation if  the symptoms or condition worsens.  Duration of encounter: 73mnutes.  Note by: BGillis Santa MD Date: 08/21/2020; Time: 2:00 PM

## 2020-08-27 ENCOUNTER — Other Ambulatory Visit: Payer: Self-pay | Admitting: Family Medicine

## 2020-08-27 DIAGNOSIS — F332 Major depressive disorder, recurrent severe without psychotic features: Secondary | ICD-10-CM

## 2020-08-27 NOTE — Telephone Encounter (Signed)
Requested Prescriptions  Pending Prescriptions Disp Refills  . FLUoxetine (PROZAC) 20 MG capsule [Pharmacy Med Name: FLUOXETINE '20MG'$  CAPSULES] 90 capsule 1    Sig: TAKE 3 CAPSULES(60 MG) BY MOUTH DAILY     Psychiatry:  Antidepressants - SSRI Passed - 08/27/2020 11:15 AM      Passed - Completed PHQ-2 or PHQ-9 in the last 360 days      Passed - Valid encounter within last 6 months    Recent Outpatient Visits          4 months ago Major depressive disorder, recurrent, severe w/o psychotic behavior ([place])   [facility] [name], DO   7 months ago [facility] [place], Lupita Raider, FNP   1 year ago Benign hypertension   [place], FNP   1 year ago Leg swelling   [facility], Lupita Raider, FNP   1 year ago Leg swelling   [facility], Lupita Raider, [place]      Future Appointments            Tomorrow [name], MD [place]

## 2020-08-28 ENCOUNTER — Ambulatory Visit (INDEPENDENT_AMBULATORY_CARE_PROVIDER_SITE_OTHER): Payer: Medicare Other | Admitting: Pulmonary Disease

## 2020-08-28 ENCOUNTER — Telehealth: Payer: Self-pay

## 2020-08-28 ENCOUNTER — Other Ambulatory Visit: Payer: Self-pay

## 2020-08-28 ENCOUNTER — Encounter: Payer: Self-pay | Admitting: Pulmonary Disease

## 2020-08-28 ENCOUNTER — Telehealth: Payer: Self-pay | Admitting: Pulmonary Disease

## 2020-08-28 VITALS — BP 122/78 | HR 81 | Temp 97.8°F | Ht 65.0 in | Wt 160.4 lb

## 2020-08-28 DIAGNOSIS — I272 Pulmonary hypertension, unspecified: Secondary | ICD-10-CM

## 2020-08-28 DIAGNOSIS — R911 Solitary pulmonary nodule: Secondary | ICD-10-CM | POA: Diagnosis not present

## 2020-08-28 DIAGNOSIS — R0609 Other forms of dyspnea: Secondary | ICD-10-CM

## 2020-08-28 DIAGNOSIS — F17210 Nicotine dependence, cigarettes, uncomplicated: Secondary | ICD-10-CM

## 2020-08-28 DIAGNOSIS — R0600 Dyspnea, unspecified: Secondary | ICD-10-CM

## 2020-08-28 DIAGNOSIS — J449 Chronic obstructive pulmonary disease, unspecified: Secondary | ICD-10-CM | POA: Diagnosis not present

## 2020-08-28 NOTE — Chronic Care Management (AMB) (Signed)
Chronic Care Management    Clinical Social Work Note  08/28/2020 Name: Beth [name] MRN: [id number] DOB: [birth year]  Beth [name] is a 75 y.o. year old female who is a primary care patient of Lorine Bears, Lupita Raider, FNP. The CCM team was consulted to assist the patient with chronic disease management and/or care coordination needs related to: Mental Health Counseling and Resources.   Engaged with patient by telephone for follow up visit in response to provider referral for social work chronic care management and care coordination services.   Consent to Services:  The patient was given information about Chronic Care Management services, agreed to services, and gave verbal consent prior to initiation of services.  Please see initial visit note for detailed documentation.   Patient agreed to services and consent obtained.   Assessment: Review of patient past medical history, allergies, medications, and health status, including review of relevant consultants reports was performed today as part of a comprehensive evaluation and provision of chronic care management and care coordination services.     SDOH (Social Determinants of Health) assessments and interventions performed:    Advanced Directives Status: Not addressed in this encounter.  CCM Care Plan  Allergies  Allergen Reactions   Bextra  [Valdecoxib]    Compazine [Prochlorperazine Edisylate]     Stroke-like symptoms   Lithium Carbonate     Leg weakness   Lyrica [Pregabalin]     Outpatient Encounter Medications as of 08/18/2020  Medication Sig Note   acetaminophen (TYLENOL) 500 MG tablet Take 500 mg by mouth 2 (two) times daily as needed.    aspirin 81 MG chewable tablet Chew 81 mg daily by mouth.    atorvastatin (LIPITOR) 40 MG tablet TAKE 1 TABLET BY MOUTH EVERY DAY    clonazePAM (KLONOPIN) 0.5 MG tablet TAKE 1 TABLET(0.5 MG) BY MOUTH EVERY OTHER DAY AS NEEDED FOR ANXIETY    DULoxetine (CYMBALTA) 20 MG capsule TAKE 1  CAPSULE(20 MG) BY MOUTH DAILY    furosemide (LASIX) 20 MG tablet Take 1 tablet (20 mg total) by mouth daily.    hydrOXYzine (ATARAX/VISTARIL) 25 MG tablet Take 25-50 mg by mouth daily as needed.    Melatonin 3 MG CAPS Take 10 mg by mouth at bedtime as needed (sleep).     metoprolol succinate (TOPROL-XL) 25 MG 24 hr tablet TAKE 1/2 TABLET(12.5 MG) BY MOUTH DAILY    montelukast (SINGULAIR) 10 MG tablet Take 1 tablet (10 mg total) by mouth at bedtime.    QUEtiapine (SEROQUEL) 100 MG tablet TAKE 1 TABLET(100 MG) BY MOUTH AT BEDTIME    [DISCONTINUED] Acetaminophen-Codeine 300-15 MG TABS Take by mouth. 1 tablet every 4-6 hours as needed per dentist (Patient not taking: Reported on 08/21/2020)    [DISCONTINUED] azelastine (ASTELIN) 0.1 % nasal spray Place 2 sprays into both nostrils 2 (two) times daily. (Patient not taking: Reported on 08/21/2020)    [DISCONTINUED] COMBIVENT RESPIMAT 20-100 MCG/ACT AERS respimat INHALE 1 PUFF BY MOUTH EVERY 6 HOURS (Patient not taking: Reported on 08/21/2020)    [DISCONTINUED] FLUoxetine (PROZAC) 20 MG capsule Take 3 capsules (60 mg total) by mouth daily.    [DISCONTINUED] Fluticasone-Umeclidin-Vilant (TRELEGY ELLIPTA) 200-62.5-25 MCG/INH AEPB Inhale 1 puff into the lungs daily. (Patient not taking: Reported on 08/21/2020)    [DISCONTINUED] gabapentin (NEURONTIN) 300 MG capsule Take 2 capsules (600 mg total) by mouth 3 (three) times daily. 02/02/2020: Reports taking 2 capsules (600 mg) twice daily   Facility-Administered Encounter Medications as of 08/18/2020  Medication  albuterol (PROVENTIL) (2.5 MG/3ML) 0.083% nebulizer solution 2.5 mg    Patient Active Problem List   Diagnosis Date Noted   Rash 01/14/2020   Pulmonary hypertension ([place]) 09/14/2019   Coccydynia 07/29/2019   Cervicalgia 05/04/2019   Acute pain of left shoulder 05/04/2019   Elbow pain, left 05/04/2019   DOE (dyspnea on exertion) 03/23/2019   Leg swelling 03/22/2019    Class: Acute   Lumbar  degenerative disc disease 09/10/2018   Left hip pain 09/10/2018   Post-traumatic osteoarthritis of left elbow 07/14/2018   Lumbar radiculopathy 02/12/2018   Lumbar spondylosis 02/12/2018   Lumbar facet arthropathy 02/12/2018   Atherosclerosis of aorta ([place]) 09/17/2017   Lung nodule 09/17/2017   Urinary tract infection 12/05/2016   Protein-calorie malnutrition, mild (HCC) 08/07/2016   Generalized anxiety disorder 07/15/2016   Chronic pain syndrome 07/15/2016   Severe episode of recurrent major depressive disorder, without psychotic features ([place]) 07/14/2016   Moderate benzodiazepine use disorder ([place]) 05/08/2016   Major depressive disorder, recurrent, severe w/o psychotic behavior ([place]) 05/01/2016   Seasonal allergic rhinitis 03/23/2015   Hyperglycemia 03/23/2015   Senile purpura ([place]) 03/23/2015   Perennial allergic rhinitis with seasonal variation 03/23/2015   Marital problems 10/31/2014   Migraine without aura and without status migrainosus, not intractable 10/31/2014   Chronic bilateral low back pain without sciatica 08/09/2014   Colon polyp 08/09/2014   COPD, severe ([place]) 08/09/2014   CVA, old, hemiparesis ([place]) 08/09/2014   Dyslipidemia 08/09/2014   Dysfunction of eustachian tube 08/09/2014   Fibromyalgia syndrome 08/09/2014   Gastro-esophageal reflux disease without esophagitis 08/09/2014   Benign migrating glossitis 08/09/2014   Cerebrovascular accident, old 08/09/2014   IBS (irritable bowel syndrome) 08/09/2014   Low back pain with radiation 08/09/2014   Chronic recurrent major depressive disorder ([place]) Q000111Q   Dysmetabolic syndrome Q000111Q   OP (osteoporosis) 08/09/2014   Vitamin D deficiency 08/09/2014   Smoking 08/09/2014   Benign hypertension 07/19/2013   Benign neoplasm of skin of trunk 06/03/2013   H/O: pneumonia 09/25/2012    Conditions to be addressed/monitored: Anxiety and Depression; Mental Health Concerns   Care Plan : General Social Work  (Adult)  Updates made by Christa See D, LCSW since 08/28/2020 12:00 AM     Problem: Response to Treatment (Depression)      Long-Range Goal: Response to Treatment Maximized   Start Date: 04/25/2020  This Visit's Progress: On track  Recent Progress: On track  Priority: Medium  Note:   Timeframe:  Long-Range Goal Priority:  Medium Start Date:  04/25/20                          Expected End Date:  09/27/20                 Follow Up Date-08/16/20   Current Barriers:  Chronic Mental Health needs related to depression Financial constraints related to managing health care expenses Limited social support ADL IADL limitations Limited access to caregiver Inability to perform ADL's independently Suicidal Ideation/Homicidal Ideation: No  Clinical Social Work Goal(s):  Over the next 120 days, patient will work with SW bi-monthly by telephone or in person to reduce or manage symptoms related to depression Over the next 120 days, patient will work with SW to address concerns related to decreasing depression and increasing self-care  Interventions: Patient interviewed and appropriate assessments performed: brief mental health assessment Patient shared that she has not heard back from [employer] to  initiate mental health services. Patient provided verbal consent for CCM LCSW to contact agency. CCM LCSW spoke with Lexine Baton and informed her that patient's e-mail is inaccessible. Nikki agreed to have a staff member visit patient's residence to assist with required paperwork to begin services. They will call patient with details 07/22: Patient still has not heard from [facility]. States she has left numerous voice mails CCM LCSW discussed with patient if she was willing to try a new referral to a different psychiatrist. Patient shared she has concerns that she was dismissed from [facility] years ago after a misunderstanding. CCM LCSW reached out to [facility] on 08/28/20 to inquire about patient's status.  Patient was indeed dismissed from their practice and is unable to return  CCM LCSW collaborated with [facility] [phone] to inquire about psychiatry appointment. Spoke with Program Director, Elberta Fortis, who reviewed patient's insurance and confirmed that they will schedule an initial intake with patient to begin medication management. Intake documents will be provided electronically for patient to complete, to expedite services, which can be provided through tele-health or in-person  CCM LCSW informed patient to expect a call from [place], the intake specialist. Patient agreed to call and leave a message for a return call, if she is unable to reach them. States she is very motivated to begin services Patient reports that her depression and anxiety symptoms are "level"  Patient agreed to contact CCM LCSW with any update regarding scheduling with [facility] Patient is aware of strategies to assist with management of symptoms and agreed to continue practicing breathing exercises to cope Patient is close with son and receives strong support from him. States that she doesn't speak often with daughter; however, children reside locally. She receives emotional support from dog. Patient denies any suicidal/homicidal ideations Collaboration with PCP regarding development and update of comprehensive plan of care as evidenced by provider attestation and co-signature Inter-disciplinary care team collaboration (see longitudinal plan of care) Patient Self Care Activities:  Schedule initial appointment with [facility] for med management Call provider office for new concerns or questions Contact office with any questions or concerns Practice deep breathing and grounding strategies to assist with management of symptoms         Christa See, MSW, [email or web address] Phone [phone] 10:20 AM

## 2020-08-28 NOTE — Progress Notes (Signed)
Subjective:    Patient ID: Beth [name], female    DOB: 14-Oct-1945, 75 y.o.   MRN: [id number] . Chief Complaint  Patient presents with   Follow-up    Sob with exertion and wheezing.    HPI Beth [name] presents today for follow-up of COPD.  This is a scheduled visit.  She states that she is doing well with Trelegy 200/62.5/25, 1 inhalation daily.  She is rinsing her mouth well after use.  She unfortunately continues to smoke anywhere between 6 to 8 cigarettes/day.  She has issues with elevated pulmonary artery pressures noted on the 2D echo of April 2021, she has not had significant desaturations with ambulatory oximetry.  Seems to note dyspnea on exertion however feels that this is somewhat better than prior.  She is deconditioned and could benefit from a conditioning program.  She has not had gastroesophageal reflux of late.  She has not had any chest pain, no orthopnea, no paroxysmal nocturnal dyspnea.  No other symptomatology.  Overall she feels well and looks well.   Review of Systems A 10 point review of systems was performed and it is as noted above otherwise negative.  Patient Active Problem List   Diagnosis Date Noted   Rash 01/14/2020   Pulmonary hypertension ([place]) 09/14/2019   Coccydynia 07/29/2019   Cervicalgia 05/04/2019   Acute pain of left shoulder 05/04/2019   Elbow pain, left 05/04/2019   DOE (dyspnea on exertion) 03/23/2019   Leg swelling 03/22/2019    Class: Acute   Lumbar degenerative disc disease 09/10/2018   Left hip pain 09/10/2018   Post-traumatic osteoarthritis of left elbow 07/14/2018   Lumbar radiculopathy 02/12/2018   Lumbar spondylosis 02/12/2018   Lumbar facet arthropathy 02/12/2018   Atherosclerosis of aorta ([place]) 09/17/2017   Lung nodule 09/17/2017   Urinary tract infection 12/05/2016   Protein-calorie malnutrition, mild (HCC) 08/07/2016   Generalized anxiety disorder 07/15/2016   Chronic pain syndrome 07/15/2016   Severe episode of  recurrent major depressive disorder, without psychotic features ([place]) 07/14/2016   Moderate benzodiazepine use disorder ([place]) 05/08/2016   Major depressive disorder, recurrent, severe w/o psychotic behavior ([place]) 05/01/2016   Seasonal allergic rhinitis 03/23/2015   Hyperglycemia 03/23/2015   Senile purpura ([place]) 03/23/2015   Perennial allergic rhinitis with seasonal variation 03/23/2015   Marital problems 10/31/2014   Migraine without aura and without status migrainosus, not intractable 10/31/2014   Chronic bilateral low back pain without sciatica 08/09/2014   Colon polyp 08/09/2014   COPD, severe ([place]) 08/09/2014   CVA, old, hemiparesis ([place]) 08/09/2014   Dyslipidemia 08/09/2014   Dysfunction of eustachian tube 08/09/2014   Fibromyalgia syndrome 08/09/2014   Gastro-esophageal reflux disease without esophagitis 08/09/2014   Benign migrating glossitis 08/09/2014   Cerebrovascular accident, old 08/09/2014   IBS (irritable bowel syndrome) 08/09/2014   Low back pain with radiation 08/09/2014   Chronic recurrent major depressive disorder ([place]) Q000111Q   Dysmetabolic syndrome Q000111Q   OP (osteoporosis) 08/09/2014   Vitamin D deficiency 08/09/2014   Smoking 08/09/2014   Benign hypertension 07/19/2013   Benign neoplasm of skin of trunk 06/03/2013   H/O: pneumonia 09/25/2012   Social History   Tobacco Use   Smoking status: Every Day    Packs/day: 1.00    Years: 57.00    Pack years: 57.00    Types: Cigarettes   Smokeless tobacco: Never   Tobacco comments:    6-8cig daily--08/28/2020  Substance Use Topics   Alcohol use: No  Alcohol/week: 0.0 standard drinks   Allergies  Allergen Reactions   Bextra  [Valdecoxib]    Compazine [Prochlorperazine Edisylate]     Stroke-like symptoms   Lithium Carbonate     Leg weakness   Lyrica [Pregabalin]    Current Meds  Medication Sig   acetaminophen (TYLENOL) 500 MG tablet Take 500 mg by mouth 2 (two) times daily as needed.    albuterol (VENTOLIN HFA) 108 (90 Base) MCG/ACT inhaler Inhale into the lungs every 6 (six) hours as needed for wheezing or shortness of breath.   aspirin 81 MG chewable tablet Chew 81 mg daily by mouth.   atorvastatin (LIPITOR) 40 MG tablet TAKE 1 TABLET BY MOUTH EVERY DAY   clonazePAM (KLONOPIN) 0.5 MG tablet TAKE 1 TABLET(0.5 MG) BY MOUTH EVERY OTHER DAY AS NEEDED FOR ANXIETY   DULoxetine (CYMBALTA) 20 MG capsule TAKE 1 CAPSULE(20 MG) BY MOUTH DAILY   FLUoxetine (PROZAC) 20 MG capsule TAKE 3 CAPSULES(60 MG) BY MOUTH DAILY   Fluticasone-Umeclidin-Vilant (TRELEGY ELLIPTA) 200-62.5-25 MCG/INH AEPB Inhale 1 puff into the lungs daily.   gabapentin (NEURONTIN) 300 MG capsule Take 2 capsules (600 mg total) by mouth 3 (three) times daily.   HYDROcodone-acetaminophen (NORCO) 5-325 MG tablet Take 1-2 tablets by mouth every 8 (eight) hours as needed for up to 15 days for moderate pain.   hydrOXYzine (ATARAX/VISTARIL) 25 MG tablet Take 25-50 mg by mouth daily as needed.   Melatonin 3 MG CAPS Take 10 mg by mouth at bedtime as needed (sleep).    metoprolol succinate (TOPROL-XL) 25 MG 24 hr tablet TAKE 1/2 TABLET(12.5 MG) BY MOUTH DAILY   montelukast (SINGULAIR) 10 MG tablet Take 1 tablet (10 mg total) by mouth at bedtime.   multivitamin-iron-minerals-folic acid (CENTRUM) chewable tablet Chew 1 tablet by mouth daily.   QUEtiapine (SEROQUEL) 100 MG tablet TAKE 1 TABLET(100 MG) BY MOUTH AT BEDTIME   Immunization History  Administered Date(s) Administered   Influenza Split 11/13/2006, 11/02/2008   Influenza, High Dose Seasonal PF 09/27/2014, 09/29/2015, 12/30/2016, 10/15/2018, 11/05/2019   Influenza, Seasonal, Injecte, Preservative Fre 11/12/2010   Influenza,inj,Quad PF,6+ Mos 10/08/2012   Influenza-Unspecified 04/08/2014   PFIZER(Purple Top)SARS-COV-2 Vaccination 03/14/2019, 04/09/2019   PPD Test 05/16/2016   Pneumococcal Conjugate-13 09/27/2014   Pneumococcal Polysaccharide-23 06/12/2010   Tdap  03/19/2007, 09/29/2015   Zoster, Live 04/16/2012       Objective:   Physical Exam BP 122/78 (BP Location: Left Arm, Cuff Size: Normal)   Pulse 81   Temp 97.8 F (36.6 C) (Temporal)   Ht '5\' 5"'$  (1.651 m)   Wt 160 lb 6.4 oz (72.8 kg)   SpO2 95%   BMI 26.69 kg/m   GENERAL: Awake, alert, ambulatory.  No conversational dyspnea, no acute distress. HEAD: Normocephalic, atraumatic. EYES: Pupils equal, round, reactive to light.  No scleral icterus. MOUTH: Nose/mouth/throat not examined due to masking requirements for COVID 19. NECK: Supple. No thyromegaly. Trachea midline. No JVD.  No adenopathy. PULMONARY: Lungs clear to auscultation bilaterally. CARDIOVASCULAR: S1 and S2. Regular rate and rhythm.  There is Arctic sclerosis murmur grade 2/6, no gallops.  Change from prior. GASTROINTESTINAL: Benign. MUSCULOSKELETAL: No joint deformity, no clubbing, no lower extremity edema.   NEUROLOGIC: Forgetful, no other overt focal deficit.  Speech is fluent. SKIN: Intact,warm,dry.  Chronic stasis changes of the lower extremity. PSYCH: Flat affect, normal behavior.     Assessment & Plan:     ICD-10-CM   1. DOE (dyspnea on exertion)  R06.00 Pulse oximetry, overnight  ECHOCARDIOGRAM COMPLETE   Multifactorial Some improvement on Trelegy 2D echo    2. Moderate COPD (chronic obstructive pulmonary disease) (HCC)  J44.9 Pulse oximetry, overnight   Continue Trelegy Ellipta Encouraged to discontinue smoking    3. Lung nodule  R91.1    4 mm millimeter on the right upper lobe Continue surveillance    4. Pulmonary hypertension (HCC)  I27.20    Reevaluate with 2D echo Overnight oximetry    5. Tobacco dependence due to cigarettes  F17.210    Patient counseled regards to discontinuation of smoking Total counseling time 3 to 5 minutes     Orders Placed This Encounter  Procedures   Pulse oximetry, overnight    On roomair    Standing Status:   Future    Standing Expiration Date:   08/28/2021    ECHOCARDIOGRAM COMPLETE    Standing Status:   Future    Standing Expiration Date:   02/28/2021    Order Specific Question:   Where should this test be performed    Answer:   [facility]    Order Specific Question:   Perflutren DEFINITY (image enhancing agent) should be administered unless hypersensitivity or allergy exist    Answer:   Administer Perflutren    Order Specific Question:   Reason for exam-Echo    Answer:   Dyspnea  R06.00   Patient has COPD moderate to severe in nature.  Continue Trelegy Ellipta.  She does have issues with pulmonary hypertension which may be adding to her dyspnea sensation.  We will recheck 2D echocardiogram.  We will also check overnight oximetry.  We will see her in follow-up in 3 months time she is to contact us prior to that time should any new difficulties arise.  We will make the results of her testing known as it becomes available.   Renold Don, MD Advanced Bronchoscopy PCCM [facility]    *This note was dictated using voice recognition software/Dragon.  Despite best efforts to proofread, errors can occur which can change the meaning.  Any change was purely unintentional.

## 2020-08-28 NOTE — Patient Instructions (Signed)
We are going to recheck a heart test.  We are going to check a oxygen level while you sleep.   We will see him in follow-up in 3 months time call sooner should any new problems arise.

## 2020-08-28 NOTE — Patient Instructions (Signed)
Visit Information   Goals Addressed               This Visit's Progress     Patient Stated     SW: I'm doing better now with my stress (pt-stated)   On track     Patient Self Care Activities:  Schedule initial appointment with [facility] for med management Call provider office for new concerns or questions Contact office with any questions or concerns Practice deep breathing and grounding strategies to assist with management of symptoms      Other     SW-Track and Manage My Symptoms-Depression   On track     Timeframe:  Long-Range Goal Priority:  Medium Start Date:  04/25/20                          Expected End Date:  09/27/20                 Follow Up Date-08/30/20   Patient Self Care Activities:  Schedule initial appointment with [facility] for med management Call provider office for new concerns or questions Contact office with any questions or concerns Practice deep breathing and grounding strategies to assist with management of symptoms        Patient verbalizes understanding of instructions provided today.   Telephone follow up appointment with care management team member scheduled for:08/30/20  Christa See, MSW, [name] Phone [phone] 10:22 AM

## 2020-08-28 NOTE — Telephone Encounter (Signed)
Created in error

## 2020-08-30 ENCOUNTER — Ambulatory Visit: Payer: Medicare Other | Admitting: Student in an Organized Health Care Education/Training Program

## 2020-08-30 ENCOUNTER — Ambulatory Visit (INDEPENDENT_AMBULATORY_CARE_PROVIDER_SITE_OTHER): Payer: Medicare Other | Admitting: Licensed Clinical Social Worker

## 2020-08-30 DIAGNOSIS — F411 Generalized anxiety disorder: Secondary | ICD-10-CM

## 2020-08-30 DIAGNOSIS — F332 Major depressive disorder, recurrent severe without psychotic features: Secondary | ICD-10-CM

## 2020-08-31 NOTE — Patient Instructions (Signed)
Visit Information   Goals Addressed               This Visit's Progress     Patient Stated     SW: I'm doing better now with my stress (pt-stated)   On track     Patient Self Care Activities:  Call provider office for new concerns or questions Contact office with any questions or concerns Practice deep breathing and grounding strategies to assist with management of symptoms      Other     SW-Track and Manage My Symptoms-Depression   On track     Timeframe:  Long-Range Goal Priority:  Medium Start Date:  04/25/20                          Expected End Date:  09/27/20                 Follow Up Date-09/13/20   Patient Self Care Activities:  Call provider office for new concerns or questions Contact office with any questions or concerns Practice deep breathing and grounding strategies to assist with management of symptoms        Patient verbalizes understanding of instructions provided today.   Telephone follow up appointment with care management team member scheduled for:09/13/20  Christa See, MSW, [email or web address] Phone [phone] 2:25 PM

## 2020-08-31 NOTE — Chronic Care Management (AMB) (Signed)
Chronic Care Management    Clinical Social Work Note  08/31/2020 Name: Beth [name] MRN: [id number] DOB: [birth year]  Beth [name] is a 75 y.o. year old female who is a primary care patient of Beth Hauser, DO. The CCM team was consulted to assist the patient with chronic disease management and/or care coordination needs related to: Mental Health Counseling and Resources.   Engaged with patient by telephone for follow up visit in response to provider referral for social work chronic care management and care coordination services.   Consent to Services:  The patient was given information about Chronic Care Management services, agreed to services, and gave verbal consent prior to initiation of services.  Please [name] initial visit note for detailed documentation.   Patient agreed to services and consent obtained.   Assessment: Patient is engaged in conversation, continues to maintain positive progress with care plan goals. She provided verbal consent for CCM Beth [name] to complete referral to [facility] for medication management. [name] Care Plan below for interventions and patient self-care actives.  Recent life changes Beth [name]: Initiating medication management to assist with management of mental health conditions  Recommendation: Patient may benefit from, and is in agreement to work with Beth [name] to address care coordination needs and will continue to work with the clinical team to address health care and disease management related needs.   Follow up Plan: Patient would like continued follow-up from CCM Beth [name] .  Follow up scheduled in 09/13/20. Patient will call office if needed prior to next encounter.    SDOH (Social Determinants of Health) assessments and interventions performed:    Advanced Directives Status: Not addressed in this encounter.  CCM Care Plan  Allergies  Allergen Reactions   Bextra  [Valdecoxib]    Compazine [Prochlorperazine Edisylate]     Stroke-like  symptoms   Lithium Carbonate     Leg weakness   Lyrica [Pregabalin]     Outpatient Encounter Medications as of 08/30/2020  Medication Sig   acetaminophen (TYLENOL) 500 MG tablet Take 500 mg by mouth 2 (two) times daily as needed.   albuterol (VENTOLIN HFA) 108 (90 Base) MCG/ACT inhaler Inhale into the lungs every 6 (six) hours as needed for wheezing or shortness of breath.   aspirin 81 MG chewable tablet Chew 81 mg daily by mouth.   atorvastatin (LIPITOR) 40 MG tablet TAKE 1 TABLET BY MOUTH EVERY DAY   clonazePAM (KLONOPIN) 0.5 MG tablet TAKE 1 TABLET(0.5 MG) BY MOUTH EVERY OTHER DAY AS NEEDED FOR ANXIETY   DULoxetine (CYMBALTA) 20 MG capsule TAKE 1 CAPSULE(20 MG) BY MOUTH DAILY   FLUoxetine (PROZAC) 20 MG capsule TAKE 3 CAPSULES(60 MG) BY MOUTH DAILY   Fluticasone-Umeclidin-Vilant (TRELEGY ELLIPTA) 200-62.5-25 MCG/INH AEPB Inhale 1 puff into the lungs daily.   furosemide (LASIX) 20 MG tablet Take 1 tablet (20 mg total) by mouth daily.   gabapentin (NEURONTIN) 300 MG capsule Take 2 capsules (600 mg total) by mouth 3 (three) times daily.   HYDROcodone-acetaminophen (NORCO) 5-325 MG tablet Take 1-2 tablets by mouth every 8 (eight) hours as needed for up to 15 days for moderate pain.   hydrOXYzine (ATARAX/VISTARIL) 25 MG tablet Take 25-50 mg by mouth daily as needed.   Melatonin 3 MG CAPS Take 10 mg by mouth at bedtime as needed (sleep).    metoprolol succinate (TOPROL-XL) 25 MG 24 hr tablet TAKE 1/2 TABLET(12.5 MG) BY MOUTH DAILY   montelukast (SINGULAIR) 10 MG tablet Take 1 tablet (10 mg total) by mouth  at bedtime.   multivitamin-iron-minerals-folic acid (CENTRUM) chewable tablet Chew 1 tablet by mouth daily.   QUEtiapine (SEROQUEL) 100 MG tablet TAKE 1 TABLET(100 MG) BY MOUTH AT BEDTIME   No facility-administered encounter medications on file as of 08/30/2020.    Patient Active Problem List   Diagnosis Date Noted   Rash 01/14/2020   Pulmonary hypertension ([place]) 09/14/2019   Coccydynia  07/29/2019   Cervicalgia 05/04/2019   Acute pain of left shoulder 05/04/2019   Elbow pain, left 05/04/2019   DOE (dyspnea on exertion) 03/23/2019   Leg swelling 03/22/2019    Class: Acute   Lumbar degenerative disc disease 09/10/2018   Left hip pain 09/10/2018   Post-traumatic osteoarthritis of left elbow 07/14/2018   Lumbar radiculopathy 02/12/2018   Lumbar spondylosis 02/12/2018   Lumbar facet arthropathy 02/12/2018   Atherosclerosis of aorta ([place]) 09/17/2017   Lung nodule 09/17/2017   Urinary tract infection 12/05/2016   Protein-calorie malnutrition, mild (HCC) 08/07/2016   Generalized anxiety disorder 07/15/2016   Chronic pain syndrome 07/15/2016   Severe episode of recurrent major depressive disorder, without psychotic features ([place]) 07/14/2016   Moderate benzodiazepine use disorder ([place]) 05/08/2016   Major depressive disorder, recurrent, severe w/o psychotic behavior ([place]) 05/01/2016   Seasonal allergic rhinitis 03/23/2015   Hyperglycemia 03/23/2015   Senile purpura ([place]) 03/23/2015   Perennial allergic rhinitis with seasonal variation 03/23/2015   Marital problems 10/31/2014   Migraine without aura and without status migrainosus, not intractable 10/31/2014   Chronic bilateral low back pain without sciatica 08/09/2014   Colon polyp 08/09/2014   COPD, severe ([place]) 08/09/2014   CVA, old, hemiparesis ([place]) 08/09/2014   Dyslipidemia 08/09/2014   Dysfunction of eustachian tube 08/09/2014   Fibromyalgia syndrome 08/09/2014   Gastro-esophageal reflux disease without esophagitis 08/09/2014   Benign migrating glossitis 08/09/2014   Cerebrovascular accident, old 08/09/2014   IBS (irritable bowel syndrome) 08/09/2014   Low back pain with radiation 08/09/2014   Chronic recurrent major depressive disorder ([place]) [id number]   Dysmetabolic syndrome Q000111Q   OP (osteoporosis) 08/09/2014   Vitamin [name] deficiency 08/09/2014   Smoking 08/09/2014   Benign hypertension 07/19/2013    Benign neoplasm of skin of trunk 06/03/2013   H/O: pneumonia 09/25/2012    Conditions to be addressed/monitored: Anxiety and Depression; Mental Health Concerns   Care Plan : General Social Work (Adult)  Updates made by Beth [name] [name], Beth [name] since 08/31/2020 12:00 AM     Problem: Response to Treatment (Depression)      Long-Range Goal: Response to Treatment Maximized   Start Date: 04/25/2020  This Visit's Progress: On track  Recent Progress: On track  Priority: Medium  Note:   Timeframe:  Long-Range Goal Priority:  Medium Start Date:  04/25/20                          Expected End Date:  09/27/20                 Follow Up Date-09/13/20   Current Barriers:  Chronic Mental Health needs related to depression Financial constraints related to managing health care expenses Limited social support ADL IADL limitations Limited access to caregiver Inability to perform ADL's independently Suicidal Ideation/Homicidal Ideation: No  Clinical Social Work Goal(s):  Over the next 120 days, patient will work with SW bi-monthly by telephone or in person to reduce or manage symptoms related to depression Over the next 120 days, patient will work with SW to address concerns  related to decreasing depression and increasing self-care  Interventions: Patient interviewed and appropriate assessments performed: brief mental health assessment Patient shared that she has not heard back from [employer] [facility] to initiate mental health services. Patient provided verbal consent for CCM Beth [name] to contact agency. CCM Beth [name] spoke with Beth [name] and informed her that patient's e-mail is inaccessible. Beth [name] agreed to have a staff member visit patient's residence to assist with required paperwork to begin services. They will call patient with details 07/22: Patient still has not heard from [facility]. States she has left numerous voice mails CCM Beth [name] discussed with patient if she was willing to try a new referral to a  different psychiatrist. Patient shared she has concerns that she was dismissed from [facility] years ago after a misunderstanding. 08/03: Patient provided verbal consent for CCM Beth [name] to complete referral to [facility] for medication mangaement CCM Beth [name] reached out to [facility] on 08/28/20 to inquire about patient's status. Patient was indeed dismissed from their practice and is unable to return  CCM Beth [name] collaborated with [facility] [phone] to inquire about psychiatry appointment. Spoke with Program Director, Beth [name], who reviewed patient's insurance and confirmed that they will schedule an initial intake with patient to begin medication management. Intake documents will be provided electronically for patient to complete, to expedite services, which can be provided through tele-health or in-person  CCM Beth [name] informed patient to expect a call from [facility], the intake specialist. Patient agreed to call and leave a message for a return call, if she is unable to reach them. States she is very motivated to begin services Patient reports that her depression and anxiety symptoms are "level"  Patient agreed to contact CCM Beth [name] with any update regarding scheduling with [facility] Patient is aware of strategies to assist with management of symptoms and agreed to continue practicing breathing exercises to cope Patient is close with son and receives strong support from him. States that she doesn't speak often with daughter; however, children reside locally. She receives emotional support from dog. Patient denies any suicidal/homicidal ideations Collaboration with PCP regarding development and update of comprehensive plan of care as evidenced by provider attestation and co-signature Inter-disciplinary care team collaboration ([name] longitudinal plan of care) Patient Self Care Activities:  Call provider office for new concerns or questions Contact office with any questions or  concerns Practice deep breathing and grounding strategies to assist with management of symptoms        Beth [name], Beth [name], [name] Phone [phone] 2:23 PM

## 2020-09-04 ENCOUNTER — Ambulatory Visit (HOSPITAL_BASED_OUTPATIENT_CLINIC_OR_DEPARTMENT_OTHER): Payer: Medicare Other | Admitting: Student in an Organized Health Care Education/Training Program

## 2020-09-04 ENCOUNTER — Encounter: Payer: Self-pay | Admitting: Student in an Organized Health Care Education/Training Program

## 2020-09-04 ENCOUNTER — Ambulatory Visit
Admission: RE | Admit: 2020-09-04 | Discharge: 2020-09-04 | Disposition: A | Discharge status: Home / Self Care | Payer: Medicare Other | Source: Ambulatory Visit | Attending: Student in an Organized Health Care Education/Training Program | Admitting: Student in an Organized Health Care Education/Training Program

## 2020-09-04 ENCOUNTER — Other Ambulatory Visit: Payer: Self-pay

## 2020-09-04 ENCOUNTER — Ambulatory Visit: Payer: Self-pay | Admitting: General Practice

## 2020-09-04 ENCOUNTER — Telehealth: Payer: Self-pay | Admitting: General Practice

## 2020-09-04 VITALS — BP 117/63 | HR 73 | Temp 97.1°F | Resp 17 | Ht 65.0 in | Wt 150.0 lb

## 2020-09-04 DIAGNOSIS — IMO0001 Reserved for inherently not codable concepts without codable children: Secondary | ICD-10-CM

## 2020-09-04 DIAGNOSIS — J449 Chronic obstructive pulmonary disease, unspecified: Secondary | ICD-10-CM

## 2020-09-04 DIAGNOSIS — M545 Low back pain, unspecified: Secondary | ICD-10-CM

## 2020-09-04 DIAGNOSIS — M47816 Spondylosis without myelopathy or radiculopathy, lumbar region: Secondary | ICD-10-CM | POA: Insufficient documentation

## 2020-09-04 DIAGNOSIS — E785 Hyperlipidemia, unspecified: Secondary | ICD-10-CM

## 2020-09-04 DIAGNOSIS — F339 Major depressive disorder, recurrent, unspecified: Secondary | ICD-10-CM

## 2020-09-04 DIAGNOSIS — W19XXXA Unspecified fall, initial encounter: Secondary | ICD-10-CM

## 2020-09-04 DIAGNOSIS — F332 Major depressive disorder, recurrent severe without psychotic features: Secondary | ICD-10-CM

## 2020-09-04 DIAGNOSIS — F411 Generalized anxiety disorder: Secondary | ICD-10-CM

## 2020-09-04 DIAGNOSIS — F17200 Nicotine dependence, unspecified, uncomplicated: Secondary | ICD-10-CM

## 2020-09-04 MED ORDER — MIDAZOLAM HCL 5 MG/5ML IJ SOLN
1.0000 mg | INTRAMUSCULAR | Status: DC | PRN
  Administered 2020-09-04: 1 mg via INTRAVENOUS
  Filled 2020-09-04: qty 5

## 2020-09-04 MED ORDER — ROPIVACAINE HCL 2 MG/ML IJ SOLN
9.0000 mL | Freq: Once | INTRAMUSCULAR | Status: AC
  Administered 2020-09-04: 10 mL via PERINEURAL

## 2020-09-04 MED ORDER — FENTANYL CITRATE (PF) 100 MCG/2ML IJ SOLN
25.0000 ug | INTRAMUSCULAR | Status: DC | PRN
  Administered 2020-09-04: 50 ug via INTRAVENOUS
  Filled 2020-09-04: qty 2

## 2020-09-04 MED ORDER — LIDOCAINE HCL 2 % IJ SOLN
20.0000 mL | Freq: Once | INTRAMUSCULAR | Status: AC
  Administered 2020-09-04: 200 mg
  Filled 2020-09-04: qty 10

## 2020-09-04 MED ORDER — DEXAMETHASONE SODIUM PHOSPHATE 10 MG/ML IJ SOLN
10.0000 mg | Freq: Once | INTRAMUSCULAR | Status: AC
  Administered 2020-09-04: 10 mg
  Filled 2020-09-04: qty 1

## 2020-09-04 NOTE — Patient Instructions (Signed)
Visit Information  PATIENT GOALS:  Goals Addressed             This Visit's Progress    RNCM: Cope with Chronic Pain       Timeframe:  Long-Range Goal Priority:  High Start Date:       09-04-2020                     Expected End Date:      09-04-2021                 Follow Up Date 11/13/2020    - learn how to meditate - practice acceptance of chronic pain - practice relaxation or meditation daily - spend time with positive people - tell myself I can (not I can't) - think of new ways to do favorite things - use distraction techniques - use relaxation during pain    Why is this important?   Stress makes chronic pain feel worse.  Feelings like depression, anxiety, stress and anger can make your body more sensitive to pain.  Learning ways to cope with stress or depression may help you find some relief from the pain.     Notes: 09-04-2020: The patient had injections this am in her left lumbar back region. She feels they have been helpful. The patient states that she will go back on the 31st for injections to the right side. Will continue to monitor for changes.      RNCM: Prevent Falls and Injury       Follow Up Date 11-13-2020   - add more outdoor lighting - always use handrails on the stairs - always wear low-heeled or flat shoes or slippers with nonskid soles - call the doctor if I am feeling too drowsy - install bathroom grab bars - keep a flashlight by the bed - keep my cell phone with me always - learn how to get back up if I fall - make an emergency alert plan in case I fall - pick up clutter from the floors - use a nonslip pad with throw rugs, or remove them completely - use a nightlight in the bathroom    Why is this important?   Most falls happen when it is hard for you to walk safely. Your balance may be off because of an illness. You may have pain in your knees, hip or other joints.  You may be overly tired or taking medicines that make you sleepy. You may not be  able to see or hear clearly.  Falls can lead to broken bones, bruises or other injuries.  There are things you can do to help prevent falling.     Notes: Patient states last fall was last week, today is 04-06-2020. Denies injuries. 09-04-2020: Denies falls since the last outreach. Will continue to monitor.         The patient verbalized understanding of instructions, educational materials, and care plan provided today and declined offer to receive copy of patient instructions, educational materials, and care plan.   Telephone follow up appointment with care management team member scheduled for: 11-13-2020 at 1145 am  [place], MSN, [place] Mobile: [phone]

## 2020-09-04 NOTE — Patient Instructions (Signed)

## 2020-09-04 NOTE — Progress Notes (Signed)
PROVIDER NOTE: Information contained herein reflects review and annotations entered in association with encounter. Interpretation of such information and data should be left to medically-trained personnel. Information provided to patient can be located elsewhere in the medical record under "Patient Instructions". Document created using STT-dictation technology, any transcriptional errors that may result from process are unintentional.    Patient: Beth [name]  Service Category: Procedure  Provider: [name], MD  DOB: Jul 08, 1945  DOS: 09/04/2020  Location: [facility]  MRN: [id number]  Setting: Ambulatory - outpatient  Referring Provider: Verl Bangs, FNP  Type: Established Patient  Specialty: Interventional Pain Management  PCP: [name], DO   Primary Reason for Visit: Interventional Pain Management Treatment. CC: Back Pain (Lumbar bilateral,  treating right side today. )  Procedure:          Anesthesia, Analgesia, Anxiolysis:  Type: Thermal Lumbar Facet, Medial Branch Radiofrequency Ablation/Neurotomy  #2 (#1 done 06/2019)         Primary Purpose: Therapeutic Region: Posterolateral Lumbosacral Spine Level: L3, L4, L5, Medial Branch Level(s). These levels will denervate the L3-4, L4-5, lumbar facet joints. Laterality: Right  Type: Moderate (Conscious) Sedation combined with Local Anesthesia Indication(s): Analgesia and Anxiety Route: Intravenous (IV) IV Access: Secured Sedation: Meaningful verbal contact was maintained at all times during the procedure  Local Anesthetic: Lidocaine 1-2%  Position: Prone   Indications: 1. Lumbar facet arthropathy   2. Lumbar spondylosis    Beth [name] has been dealing with the above chronic pain for longer than three months and has either failed to respond, was unable to tolerate, or simply did not get enough benefit from other more conservative therapies including, but not limited to: 1. Over-the-counter  medications 2. Anti-inflammatory medications 3. Muscle relaxants 4. Membrane stabilizers 5. Opioids 6. Physical therapy and/or chiropractic manipulation 7. Modalities (Heat, ice, etc.) 8. Invasive techniques such as nerve blocks. Beth [name] has attained more than 50% relief of the pain from a series of diagnostic injections conducted in separate occasions.  Pain Score: Pre-procedure: 6 /10 Post-procedure: 0-No pain/10  Pre-op Assessment:  Ms. Delich is a 75 y.o. (year old), female patient, seen today for interventional treatment. She  has a past surgical history that includes Breast surgery (2011); Cervical discectomy; Appendectomy; Abdominal hysterectomy; Cholecystectomy; Sinusotomy; and Breast excisional biopsy (2011). Beth [name] has a current medication list which includes the following prescription(s): acetaminophen, albuterol, aspirin, atorvastatin, clonazepam, duloxetine, fluoxetine, trelegy ellipta, furosemide, gabapentin, hydrocodone-acetaminophen, hydroxyzine, melatonin, metoprolol succinate, montelukast, multivitamin-iron-minerals-folic acid, and quetiapine, and the following Facility-Administered Medications: fentanyl and midazolam. Her primarily concern today is the Back Pain (Lumbar bilateral,  treating right side today. )  Initial Vital Signs:  Pulse/HCG Rate: 73ECG Heart Rate: 73 Temp: (!) 97.2 F (36.2 C) Resp: 16 BP: (!) 122/58 SpO2: 95 %  BMI: Estimated body mass index is 24.96 kg/m as calculated from the following:   Height as of this encounter: '5\' 5"'$  (1.651 m).   Weight as of this encounter: 150 lb (68 kg).  Risk Assessment: Allergies: Reviewed. She is allergic to bextra  [valdecoxib], compazine [prochlorperazine edisylate], lithium carbonate, and lyrica [pregabalin].  Allergy Precautions: None required Coagulopathies: Reviewed. None identified.  Blood-thinner therapy: None at this time Active Infection(s): Reviewed. None identified. Beth [name] is  afebrile  Site Confirmation: Beth [name] was asked to confirm the procedure and laterality before marking the site Procedure checklist: Completed Consent: Before the procedure and under the influence of no sedative(s), amnesic(s), or anxiolytics, the patient was  informed of the treatment options, risks and possible complications. To fulfill our ethical and legal obligations, as recommended by the American Medical Association's Code of Ethics, I have informed the patient of my clinical impression; the nature and purpose of the treatment or procedure; the risks, benefits, and possible complications of the intervention; the alternatives, including doing nothing; the risk(s) and benefit(s) of the alternative treatment(s) or procedure(s); and the risk(s) and benefit(s) of doing nothing. The patient was provided information about the general risks and possible complications associated with the procedure. These may include, but are not limited to: failure to achieve desired goals, infection, bleeding, organ or nerve damage, allergic reactions, paralysis, and death. In addition, the patient was informed of those risks and complications associated to Spine-related procedures, such as failure to decrease pain; infection (i.e.: Meningitis, epidural or intraspinal abscess); bleeding (i.e.: epidural hematoma, subarachnoid hemorrhage, or any other type of intraspinal or peri-dural bleeding); organ or nerve damage (i.e.: Any type of peripheral nerve, nerve root, or spinal cord injury) with subsequent damage to sensory, motor, and/or autonomic systems, resulting in permanent pain, numbness, and/or weakness of one or several areas of the body; allergic reactions; (i.e.: anaphylactic reaction); and/or death. Furthermore, the patient was informed of those risks and complications associated with the medications. These include, but are not limited to: allergic reactions (i.e.: anaphylactic or anaphylactoid reaction(s)); adrenal  axis suppression; blood sugar elevation that in diabetics may result in ketoacidosis or comma; water retention that in patients with history of congestive heart failure may result in shortness of breath, pulmonary edema, and decompensation with resultant heart failure; weight gain; swelling or edema; medication-induced neural toxicity; particulate matter embolism and blood vessel occlusion with resultant organ, and/or nervous system infarction; and/or aseptic necrosis of one or more joints. Finally, the patient was informed that Medicine is not an exact science; therefore, there is also the possibility of unforeseen or unpredictable risks and/or possible complications that may result in a catastrophic outcome. The patient indicated having understood very clearly. We have given the patient no guarantees and we have made no promises. Enough time was given to the patient to ask questions, all of which were answered to the patient's satisfaction. Ms. Kleinberg has indicated that she wanted to continue with the procedure. Attestation: I, the ordering provider, attest that I have discussed with the patient the benefits, risks, side-effects, alternatives, likelihood of achieving goals, and potential problems during recovery for the procedure that I have provided informed consent. Date  Time: 09/04/2020  8:16 AM  Pre-Procedure Preparation:  Monitoring: As per clinic protocol. Respiration, ETCO2, SpO2, BP, heart rate and rhythm monitor placed and checked for adequate function Safety Precautions: Patient was assessed for positional comfort and pressure points before starting the procedure. Time-out: I initiated and conducted the "Time-out" before starting the procedure, as per protocol. The patient was asked to participate by confirming the accuracy of the "Time Out" information. Verification of the correct person, site, and procedure were performed and confirmed by me, the nursing staff, and the patient. "Time-out"  conducted as per Joint Commission's Universal Protocol (UP.01.01.01). Time: 0908  Description of Procedure:          Laterality: Right Levels:   L3, L4, L5, Medial Branch Level(s), at the L3-4, L4-5 lumbar facet joints. Area Prepped: Lumbosacral DuraPrep (Iodine Povacrylex [0.7% available iodine] and Isopropyl Alcohol, 74% w/w) Safety Precautions: Aspiration looking for blood return was conducted prior to all injections. At no point did we inject any substances, as a  needle was being advanced. Before injecting, the patient was told to immediately notify me if she was experiencing any new onset of "ringing in the ears, or metallic taste in the mouth". No attempts were made at seeking any paresthesias. Safe injection practices and needle disposal techniques used. Medications properly checked for expiration dates. SDV (single dose vial) medications used. After the completion of the procedure, all disposable equipment used was discarded in the proper designated medical waste containers. Local Anesthesia: Protocol guidelines were followed. The patient was positioned over the fluoroscopy table. The area was prepped in the usual manner. The time-out was completed. The target area was identified using fluoroscopy. A 12-in long, straight, sterile hemostat was used with fluoroscopic guidance to locate the targets for each level blocked. Once located, the skin was marked with an approved surgical skin marker. Once all sites were marked, the skin (epidermis, dermis, and hypodermis), as well as deeper tissues (fat, connective tissue and muscle) were infiltrated with a small amount of a short-acting local anesthetic, loaded on a 10cc syringe with a 25G, 1.5-in  Needle. An appropriate amount of time was allowed for local anesthetics to take effect before proceeding to the next step. Local Anesthetic: Lidocaine 2.0% The unused portion of the local anesthetic was discarded in the proper designated containers. Technical  explanation of process:  Radiofrequency Ablation (RFA)  L3 Medial Branch Nerve RFA: The target area for the L3 medial branch is at the junction of the postero-lateral aspect of the superior articular process and the superior, posterior, and medial edge of the transverse process of L4. Under fluoroscopic guidance, a Radiofrequency needle was inserted until contact was made with os over the superior postero-lateral aspect of the pedicular shadow (target area). Sensory and motor testing was conducted to properly adjust the position of the needle. Once satisfactory placement of the needle was achieved, the numbing solution was slowly injected after negative aspiration for blood. 2.0 mL of the nerve block solution was injected without difficulty or complication. After waiting for at least 3 minutes, the ablation was performed. Once completed, the needle was removed intact. L4 Medial Branch Nerve RFA: The target area for the L4 medial branch is at the junction of the postero-lateral aspect of the superior articular process and the superior, posterior, and medial edge of the transverse process of L5. Under fluoroscopic guidance, a Radiofrequency needle was inserted until contact was made with os over the superior postero-lateral aspect of the pedicular shadow (target area). Sensory and motor testing was conducted to properly adjust the position of the needle. Once satisfactory placement of the needle was achieved, the numbing solution was slowly injected after negative aspiration for blood. 2.0 mL of the nerve block solution was injected without difficulty or complication. After waiting for at least 3 minutes, the ablation was performed. Once completed, the needle was removed intact. L5 Medial Branch Nerve RFA: The target area for the L5 medial branch is at the junction of the postero-lateral aspect of the superior articular process of S1 and the superior, posterior, and medial edge of the sacral ala. Under  fluoroscopic guidance, a Radiofrequency needle was inserted until contact was made with os over the superior postero-lateral aspect of the pedicular shadow (target area). Sensory and motor testing was conducted to properly adjust the position of the needle. Once satisfactory placement of the needle was achieved, the numbing solution was slowly injected after negative aspiration for blood. 2.0 mL of the nerve block solution was injected without difficulty or complication.  After waiting for at least 3 minutes, the ablation was performed. Once completed, the needle was removed intact.  Radiofrequency lesioning (ablation):  Radiofrequency Generator: NeuroTherm [id number] Sensory Stimulation Parameters: 50 Hz was used to locate & identify the nerve, making sure that the needle was positioned such that there was no sensory stimulation below 0.3 V or above 0.7 V. Motor Stimulation Parameters: 2 Hz was used to evaluate the motor component. Care was taken not to lesion any nerves that demonstrated motor stimulation of the lower extremities at an output of less than 2.5 times that of the sensory threshold, or a maximum of 2.0 V. Lesioning Technique Parameters: Standard Radiofrequency settings. (Not bipolar or pulsed.) Temperature Settings: 80 degrees C Lesioning time: 60 seconds Intra-operative Compliance: Compliant Materials & Medications: Needle(s) (Electrode/Cannula) Type: Teflon-coated, curved tip, Radiofrequency needle(s) Gauge: 22G Length: 10cm Numbing solution: 6 cc solution made of 5 cc of 0.2% ropivacaine, 1 cc of Decadron 10 mg/cc.  2 cc injected at each level above on the right prior to lesioning after sensorimotor testing.    Once the entire procedure was completed, the treated area was cleaned, making sure to leave some of the prepping solution back to take advantage of its long term bactericidal properties.  Illustration of the posterior view of the lumbar spine and the posterior neural structures.  Laminae of L2 through S1 are labeled. DPRL5, dorsal primary ramus of L5; DPRS1, dorsal primary ramus of S1; DPR3, dorsal primary ramus of L3; FJ, facet (zygapophyseal) joint L3-L4; I, inferior articular process of L4; LB1, lateral branch of dorsal primary ramus of L1; IAB, inferior articular branches from L3 medial branch (supplies L4-L5 facet joint); IBP, intermediate branch plexus; MB3, medial branch of dorsal primary ramus of L3; NR3, third lumbar nerve root; S, superior articular process of L5; SAB, superior articular branches from L4 (supplies L4-5 facet joint also); TP3, transverse process of L3.  Vitals:   09/04/20 0927 09/04/20 0936 09/04/20 0947 09/04/20 0958  BP:  120/64 111/64 117/63  Pulse:      Resp: '18 15 17 17  '$ Temp:  (!) 97.1 F (36.2 C)    TempSrc:  Temporal    SpO2: 100% 93% 95% 95%  Weight:      Height:       Start Time: 0908 hrs. End Time: 0927 hrs.  Imaging Guidance (Spinal):          Type of Imaging Technique: Fluoroscopy Guidance (Spinal) Indication(s): Assistance in needle guidance and placement for procedures requiring needle placement in or near specific anatomical locations not easily accessible without such assistance. Exposure Time: Please see nurses notes. Contrast: None used. Fluoroscopic Guidance: I was personally present during the use of fluoroscopy. "Tunnel Vision Technique" used to obtain the best possible view of the target area. Parallax error corrected before commencing the procedure. "Direction-depth-direction" technique used to introduce the needle under continuous pulsed fluoroscopy. Once target was reached, antero-posterior, oblique, and lateral fluoroscopic projection used confirm needle placement in all planes. Images permanently stored in EMR. Interpretation: No contrast injected. I personally interpreted the imaging intraoperatively. Adequate needle placement confirmed in multiple planes. Permanent images saved into the patient's  record.  Post-operative Assessment:  Post-procedure Vital Signs:  Pulse/HCG Rate: 7370 Temp: (!) 97.1 F (36.2 C) Resp: 17 BP: 117/63 SpO2: 95 %  EBL: None  Complications: No immediate post-treatment complications observed by team, or reported by patient.  Note: The patient tolerated the entire procedure well. A repeat set of vitals were taken after  the procedure and the patient was kept under observation following institutional policy, for this type of procedure. Post-procedural neurological assessment was performed, showing return to baseline, prior to discharge. The patient was provided with post-procedure discharge instructions, including a section on how to identify potential problems. Should any problems arise concerning this procedure, the patient was given instructions to immediately contact us, at any time, without hesitation. In any case, we plan to contact the patient by telephone for a follow-up status report regarding this interventional procedure.  Comments:  No additional relevant information.  Plan of Care  Orders:  Orders Placed This Encounter  Procedures   DG PA[facility] C-ARM 1-60 MIN NO REPORT    Intraoperative interpretation by procedural physician at [facility].    Standing Status:   Standing    Number of Occurrences:   1    Order Specific Question:   Reason for exam:    Answer:   Assistance in needle guidance and placement for procedures requiring needle placement in or near specific anatomical locations not easily accessible without such assistance.    Medications ordered for procedure: Meds ordered this encounter  Medications   lidocaine (XYLOCAINE) 2 % (with pres) injection 400 mg   fentaNYL (SUBLIMAZE) injection 25-50 mcg    Make sure Narcan is available in the pyxis when using this medication. In the event of respiratory depression (RR< 8/min): Titrate NARCAN (naloxone) in increments of 0.1 to 0.2 mg IV at 2-3 minute intervals, until  desired degree of reversal.   midazolam (VERSED) 5 MG/5ML injection 1-2 mg    Make sure Flumazenil is available in the pyxis when using this medication. If oversedation occurs, administer 0.2 mg IV over 15 sec. If after 45 sec no response, administer 0.2 mg again over 1 min; may repeat at 1 min intervals; not to exceed 4 doses (1 mg)   ropivacaine (PF) 2 mg/mL (0.2%) (NAROPIN) injection 9 mL   dexamethasone (DECADRON) injection 10 mg    Medications administered: We administered lidocaine, fentaNYL, midazolam, ropivacaine (PF) 2 mg/mL (0.2%), and dexamethasone.  See the medical record for exact dosing, route, and time of administration.  Follow-up plan:   Return in about 2 weeks (around 09/18/2020) for Contra-lateral RFA, moderate sedation (ECT suite).      Status post bilateral facet medial branch nerve blocks L3, L4, L5 on 09/03/2017, helpful for axial low back and buttock pain: Repeat PRN.  Status post right L4-L5 epidural steroid injection on 03/09/2018, 07/29/2018 which was helpful for her right-sided hip and leg pain.  Right L4-L5 ESI #3 on 09/23/2027 not as helpful.  Right L3, L4, L5 RFA on 07/07/2019, 09/04/20      Recent Visits Date Type Provider Dept  08/21/20 Office Visit [name], MD [facility]  Showing recent visits within past 90 days and meeting all other requirements Today's Visits Date Type Provider Dept  09/04/20 Procedure visit [name], MD [facility]  Showing today's visits and meeting all other requirements Future Appointments Date Type Provider Dept  09/27/20 Appointment [name], MD [facility]  Showing future appointments within next 90 days and meeting all other requirements  Disposition: Discharge home  Discharge (Date  Time): 09/04/2020; 1000 hrs.   Primary Care Physician: [name], DO Location: [facility] Note by: [name], MD Date: 09/04/2020; Time: 10:03 AM  Disclaimer:   Medicine is not an exact science. The only guarantee in medicine is that nothing is guaranteed.  It is important to note that the decision to proceed with this intervention was based on the information collected from the patient. The Data and conclusions were drawn from the patient's questionnaire, the interview, and the physical examination. Because the information was provided in large part by the patient, it cannot be guaranteed that it has not been purposely or unconsciously manipulated. Every effort has been made to obtain as much relevant data as possible for this evaluation. It is important to note that the conclusions that lead to this procedure are derived in large part from the available data. Always take into account that the treatment will also be dependent on availability of resources and existing treatment guidelines, considered by other Pain Management Practitioners as being common knowledge and practice, at the time of the intervention. For Medico-Legal purposes, it is also important to point out that variation in procedural techniques and pharmacological choices are the acceptable norm. The indications, contraindications, technique, and results of the above procedure should only be interpreted and judged by a Board-Certified Interventional Pain Specialist with extensive familiarity and expertise in the same exact procedure and technique.

## 2020-09-04 NOTE — Progress Notes (Signed)
Safety precautions to be maintained throughout the outpatient stay will include: orient to surroundings, keep bed in low position, maintain call bell within reach at all times, provide assistance with transfer out of bed and ambulation.  

## 2020-09-04 NOTE — Chronic Care Management (AMB) (Signed)
Chronic Care Management   CCM RN Visit Note  09/04/2020 Name: Beth [name] MRN: [id number] DOB: Apr 14, 1945  Subjective: Beth [name] is a 75 y.o. year old female who is a primary care patient of [name], DO. The care management team was consulted for assistance with disease management and care coordination needs.    Engaged with patient by telephone for follow up visit in response to provider referral for case management and/or care coordination services.   Consent to Services:  The patient was given information about Chronic Care Management services, agreed to services, and gave verbal consent prior to initiation of services.  Please see initial visit note for detailed documentation.   Patient agreed to services and verbal consent obtained.   Assessment: Review of patient past medical history, allergies, medications, health status, including review of consultants reports, laboratory and other test data, was performed as part of comprehensive evaluation and provision of chronic care management services.   SDOH (Social Determinants of Health) assessments and interventions performed:    CCM Care Plan  Allergies  Allergen Reactions   Bextra  [Valdecoxib]    Compazine [Prochlorperazine Edisylate]     Stroke-like symptoms   Lithium Carbonate     Leg weakness   Lyrica [Pregabalin]     Outpatient Encounter Medications as of 09/04/2020  Medication Sig   acetaminophen (TYLENOL) 500 MG tablet Take 500 mg by mouth 2 (two) times daily as needed.   albuterol (VENTOLIN HFA) 108 (90 Base) MCG/ACT inhaler Inhale into the lungs every 6 (six) hours as needed for wheezing or shortness of breath.   aspirin 81 MG chewable tablet Chew 81 mg daily by mouth.   atorvastatin (LIPITOR) 40 MG tablet TAKE 1 TABLET BY MOUTH EVERY DAY   clonazePAM (KLONOPIN) 0.5 MG tablet TAKE 1 TABLET(0.5 MG) BY MOUTH EVERY OTHER DAY AS NEEDED FOR ANXIETY   DULoxetine (CYMBALTA) 20 MG capsule TAKE 1  CAPSULE(20 MG) BY MOUTH DAILY   FLUoxetine (PROZAC) 20 MG capsule TAKE 3 CAPSULES(60 MG) BY MOUTH DAILY   Fluticasone-Umeclidin-Vilant (TRELEGY ELLIPTA) 200-62.5-25 MCG/INH AEPB Inhale 1 puff into the lungs daily.   furosemide (LASIX) 20 MG tablet Take 1 tablet (20 mg total) by mouth daily.   gabapentin (NEURONTIN) 300 MG capsule Take 2 capsules (600 mg total) by mouth 3 (three) times daily.   HYDROcodone-acetaminophen (NORCO) 5-325 MG tablet Take 1-2 tablets by mouth every 8 (eight) hours as needed for up to 15 days for moderate pain.   hydrOXYzine (ATARAX/VISTARIL) 25 MG tablet Take 25-50 mg by mouth daily as needed.   Melatonin 3 MG CAPS Take 10 mg by mouth at bedtime as needed (sleep).    metoprolol succinate (TOPROL-XL) 25 MG 24 hr tablet TAKE 1/2 TABLET(12.5 MG) BY MOUTH DAILY   montelukast (SINGULAIR) 10 MG tablet Take 1 tablet (10 mg total) by mouth at bedtime.   multivitamin-iron-minerals-folic acid (CENTRUM) chewable tablet Chew 1 tablet by mouth daily.   QUEtiapine (SEROQUEL) 100 MG tablet TAKE 1 TABLET(100 MG) BY MOUTH AT BEDTIME   Facility-Administered Encounter Medications as of 09/04/2020  Medication   fentaNYL (SUBLIMAZE) injection 25-50 mcg   midazolam (VERSED) 5 MG/5ML injection 1-2 mg    Patient Active Problem List   Diagnosis Date Noted   Rash 01/14/2020   Pulmonary hypertension ([place]) 09/14/2019   Coccydynia 07/29/2019   Cervicalgia 05/04/2019   Acute pain of left shoulder 05/04/2019   Elbow pain, left 05/04/2019   DOE (dyspnea on exertion) 03/23/2019  Leg swelling 03/22/2019    Class: Acute   Lumbar degenerative disc disease 09/10/2018   Left hip pain 09/10/2018   Post-traumatic osteoarthritis of left elbow 07/14/2018   Lumbar radiculopathy 02/12/2018   Lumbar spondylosis 02/12/2018   Lumbar facet arthropathy 02/12/2018   Atherosclerosis of aorta ([place]) 09/17/2017   Lung nodule 09/17/2017   Urinary tract infection 12/05/2016   Protein-calorie malnutrition,  mild (HCC) 08/07/2016   Generalized anxiety disorder 07/15/2016   Chronic pain syndrome 07/15/2016   Severe episode of recurrent major depressive disorder, without psychotic features ([place]) 07/14/2016   Moderate benzodiazepine use disorder ([place]) 05/08/2016   Major depressive disorder, recurrent, severe w/o psychotic behavior ([place]) 05/01/2016   Seasonal allergic rhinitis 03/23/2015   Hyperglycemia 03/23/2015   Senile purpura ([place]) 03/23/2015   Perennial allergic rhinitis with seasonal variation 03/23/2015   Marital problems 10/31/2014   Migraine without aura and without status migrainosus, not intractable 10/31/2014   Chronic bilateral low back pain without sciatica 08/09/2014   Colon polyp 08/09/2014   COPD, severe ([place]) 08/09/2014   CVA, old, hemiparesis ([place]) 08/09/2014   Dyslipidemia 08/09/2014   Dysfunction of eustachian tube 08/09/2014   Fibromyalgia syndrome 08/09/2014   Gastro-esophageal reflux disease without esophagitis 08/09/2014   Benign migrating glossitis 08/09/2014   Cerebrovascular accident, old 08/09/2014   IBS (irritable bowel syndrome) 08/09/2014   Low back pain with radiation 08/09/2014   Chronic recurrent major depressive disorder ([place]) Q000111Q   Dysmetabolic syndrome Q000111Q   OP (osteoporosis) 08/09/2014   Vitamin D deficiency 08/09/2014   Smoking 08/09/2014   Benign hypertension 07/19/2013   Benign neoplasm of skin of trunk 06/03/2013   H/O: pneumonia 09/25/2012    Conditions to be addressed/monitored:HLD, COPD, Anxiety, Depression, and Chronic pain in back, falls prevention, and smoking cessation  Care Plan : RNCM: HLD Management  Updates made by Vanita Ingles since 09/04/2020 12:00 AM     Problem: RNCM: HLD Management   Priority: Medium     Long-Range Goal: RNCM: HLD Mangement   Start Date: 04/06/2020  Expected End Date: 08/30/2021  This Visit's Progress: On track  Priority: Medium  Note:   Current Barriers:  Poorly controlled  hyperlipidemia, complicated by COPD, smoker Current antihyperlipidemic regimen: Lipitor 40 mg QD Most recent lipid panel:  Lab Results  Component Value Date   CHOL 135 04/10/2018   CHOL 131 06/26/2017   CHOL 86 07/15/2016   Lab Results  Component Value Date   HDL 59 04/10/2018   HDL 57 06/26/2017   HDL 33 (A) 07/15/2016   Lab Results  Component Value Date   LDLCALC 64 04/10/2018   [place] 61 06/26/2017   LDLCALC 37 07/15/2016   Lab Results  Component Value Date   TRIG 50 04/10/2018   TRIG 46 06/26/2017   TRIG 78 07/15/2016   Lab Results  Component Value Date   CHOLHDL 2.3 04/10/2018   CHOLHDL 2.3 06/26/2017   CHOLHDL 2.6 03/21/2016   No results found for: LDLDIRECT  ASCVD risk enhancing conditions: age >3,  HTN,  current smoker Lacks Scientist, product/process development Does not maintain contact with provider office Does not contact provider office for questions/concerns RN Care Manager Clinical Goal(s):  patient will work with Consulting civil engineer, providers, and care team towards execution of optimized self-health management plan patient will verbalize understanding of plan for effective management of HLD  patient will work with RNCM and pcp  to address needs related to management of HLD Interventions: Collaboration with Parks Ranger, [name], DO  regarding development and update of comprehensive plan of care as evidenced by provider attestation and co-signature Inter-disciplinary care team collaboration (see longitudinal plan of care) Medication review performed; medication list updated in electronic medical record.  Inter-disciplinary care team collaboration (see longitudinal plan of care) Referred to pharmacy team for assistance with HLD medication management Evaluation of current treatment plan related to HLD and patient's adherence to plan as established by provider. 09-04-2020: The patient states she is compliant with medications and plan of care. Denies any new concerns related to  HLD  Advised patient to call the office for changes in condition or questions  Provided education to patient re: heart healthy diet, medication compliance, and working with CCM team to manage health and well being.  Discussed plans with patient for ongoing care management follow up and provided patient with direct contact information for care management team Patient Goals/Self-Care Activities: - call for medicine refill 2 or 3 days before it runs out - call if I am sick and can't take my medicine - keep a list of all the medicines I take; vitamins and herbals too - learn to read medicine labels - use a pillbox to sort medicine - use an alarm clock or phone to remind me to take my medicine - change to whole grain breads, cereal, pasta - drink 6 to 8 glasses of water each day - eat 3 to 5 servings of fruits and vegetables each day - eat 5 or 6 small meals each day - fill half the plate with nonstarchy vegetables - limit fast food meals to no more than 1 per week - manage portion size - prepare main meal at home 3 to 5 days each week - read food labels for fat, fiber, carbohydrates and portion size - be open to making changes - I can manage, know and watch for signs of a heart attack - if I have chest pain, call for help - learn about small changes that will make a big difference - learn my personal risk factors - barriers to meeting goals identified - change-talk evoked - choices provided - collaboration with team encouraged - decision-making supported - health risks reviewed - problem-solving facilitated - questions answered - readiness for change evaluated - reassurance provided - resources needed to meet goals identified - self-reflection promoted - self-reliance encouraged - verbalization of feelings encouraged  Follow Up Plan: Telephone follow up appointment with care management team member scheduled for:11-13-2020 at 1145 am      Task: RNCM: HLD Management Completed  09/04/2020  Outcome: Positive  Note:   Care Management Activities:    - barriers to meeting goals identified - change-talk evoked - choices provided - collaboration with team encouraged - decision-making supported - health risks reviewed - problem-solving facilitated - questions answered - readiness for change evaluated - reassurance provided - resources needed to meet goals identified - self-reflection promoted - self-reliance encouraged - verbalization of feelings encouraged        Care Plan : RNCM: Depression (Adult) and Anxiety  Updates made by Vanita Ingles since 09/04/2020 12:00 AM     Problem: RNCM: Depression Identification (Depression) and anxiety   Priority: Medium     Long-Range Goal: RNCM: Depressive Symptoms Identified and Anxiety   Start Date: 04/06/2020  Expected End Date: 07/01/2021  This Visit's Progress: On track  Priority: Medium  Note:   Current Barriers:  Knowledge Deficits related to resources to help with finding a new psychiatrist in the are to help  with effective management of depression and anxiety  Chronic Disease Management support and education needs related to effective management of depression and anxiety  Lacks caregiver support.  Difficulty obtaining medications Unable to independently manage depression and anxiety as evidence of the need for a new psychiatrist in [place] to self administer medications as prescribed Lacks social connections Does not maintain contact with provider office Does not contact provider office for questions/concerns Unable to secure a new psychiatrist- 09-04-2020: Still trying to acquire a new psychiatrist. Working with LCSW to assist with securing a new psychiatrist.   Nurse Case Manager Clinical Goal(s):  patient will verbalize understanding of plan for effective management of depression and anxiety  patient will work with RNCM, pcp, and CCM team  to address needs related to depression ad anxiety   patient will demonstrate improved adherence to prescribed treatment plan for depression and anxiety  as evidenced byno exacerbations in depression/anxiety or changes in mood  Interventions:  1:1 collaboration with [name], DO regarding development and update of comprehensive plan of care as evidenced by provider attestation and co-signature Inter-disciplinary care team collaboration (see longitudinal plan of care) Evaluation of current treatment plan related to depression and anxiety  and patient's adherence to plan as established by provider. 06-08-2020: Saw the pcp in March. Medications refilled at that time. The patient states that she has not found a new psychiatrist yet. She has called her insurance and left messages, but has not received returned calls. She states she will need refills soon. Education on contacting the providers on the list she was provided by her insurance company. Also advised the patient to get a follow up visit with pcp for recommendations and evaluation. 09-04-2020: The patient still is without a psychiatrist. LCSW called [facility] and they will not accept the patient back. The LCSW has notified [facility]. The patient has not heard from them yet. The referral was made last week so hopefully the patient will get in to the [facility] program for medications management. The RNCM will continue to monitor. The patient had a procedure today and was tired so she did not want to talk long. The patient feels she is stable with her depression and anxiety at this time. Will continue to monitor. Collaboration with LCSW today by phone to update the LCSW accordingly. Reminded the patient of upcoming appointments with the LCSW and the pharm D.  Advised patient to work with CCM team to meet depression and anxiety needs. Has talked with LCSW recently. Discussed calling the office for changes in mood, anxiety, or depression. 09-04-2020: Reviewed with the patient and will  continue  to collaborate with the CCM team and pcp on best course of action to assist with finding needed psychiatric support for the patient.  The patient denies any new concerns with her depression or anxiety but would like to find a new psychiatrist to manage her care. She states most doctors do not want to treat patients over the age of 37. Provided education to patient re: effective ways to manage depression and anxiety  Reviewed medications with patient and discussed compliance. States she is in need of some refills Collaborated with RNCM, pcp, and CCM team  regarding patient saying she is out of clonazepam and montelukast and her psychiatrist in [place] says she is too far and will not see her or refill medications. The patient is receptive to working with CCM team to get a new psychiatrist. 09-04-2020: Is currently still looking for  psychiatrist.  Social Work referral for help with new psychiatrist. 09-04-2020: The patient is actively working with the [facility] referral for medication management. 09-04-2020: The patient is actively working with the pharm D Discussed plans with patient for ongoing care management follow up and provided patient with direct contact information for care management team  Patient Goals/Self-Care Activities Over the next 120 days, patient will:  - Patient will self administer medications as prescribed Patient will attend all scheduled provider appointments Patient will call pharmacy for medication refills Patient will attend church or other social activities Patient will continue to perform ADL's independently Patient will continue to perform IADL's independently Patient will call provider office for new concerns or questions Patient will work with BSW to address care coordination needs and will continue to work with the clinical team to address health care and disease management related needs.    - anxiety screen reviewed - depression screen reviewed - medication list  reviewed - participation in psychiatric services encouraged Follow Up Plan: Telephone follow up appointment with care management team member scheduled for: 11-13-2020 at 1145 am        Task: RNCM: Identify Depressive Symptoms and Facilitate Treatment Completed 09/04/2020  Outcome: Positive  Note:   Care Management Activities:    - anxiety screen reviewed - depression screen reviewed - medication list reviewed - participation in psychiatric services encouraged        Care Plan : RNCM: COPD (Adult)  Updates made by Vanita Ingles since 09/04/2020 12:00 AM     Problem: RNCM; Psychological Adjustment to Diagnosis (COPD)   Priority: Medium     Long-Range Goal: RNCM:  Adjustment to Disease Achieved   Start Date: 04/06/2020  Expected End Date: 08/30/2021  This Visit's Progress: On track  Priority: Medium  Note:   Current Barriers:  Knowledge deficits related to basic understanding of COPD disease process Knowledge deficits related to basic COPD self care/management Knowledge deficit related to basic understanding of how to use inhalers and how inhaled medications work Knowledge deficit related to importance of energy conservation Lacks social connections Does not maintain contact with provider office Does not contact provider office for questions/concerns  Case Manager Clinical Goal(s): patient will report using inhalers as prescribed including rinsing mouth after use patient will report utilizing pursed lip breathing for shortness of breath patient will verbalize understanding of COPD action plan and when to seek appropriate levels of medical care patient will engage in lite exercise as tolerated to build/regain stamina and strength and reduce shortness of breath through activity tolerance patient will verbalize basic understanding of COPD disease process and self care activities patient will not be hospitalized for COPD exacerbation as evidenced  Interventions:  Collaboration  with Parks Ranger, [name], DO regarding development and update of comprehensive plan of care as evidenced by provider attestation and co-signature Inter-disciplinary care team collaboration (see longitudinal plan of care) Provided patient with basic written and verbal COPD education on self care/management/and exacerbation prevention  Provided patient with COPD action plan and reinforced importance of daily self assessment. 09-04-2020: The patient feels her COPD is effectively managed at this time. The patient states that she is not having any issues currently with her breathing.  Provided written and verbal instructions on pursed lip breathing and utilized returned demonstration as teach back Provided instruction about proper use of medications used for management of COPD including inhalers Advised patient to self assesses COPD action plan zone and make appointment with provider if in the yellow  zone for 48 hours without improvement. Provided patient with education about the role of exercise in the management of COPD Advised patient to engage in light exercise as tolerated 3-5 days a week Provided education about and advised patient to utilize infection prevention strategies to reduce risk of respiratory infection  06-08-2020: Review of COPD status. The patient has been taking ABX but for a broken tooth that will be removed on 09-04-2020. She denies any issues with her COPD at this time.  Patient Goals/Self-Care Activities:  - counseling provided - decision-making supported - depression screen reviewed - emotional support provided - family involvement promoted - problem-solving facilitated - relaxation techniques promoted - verbalization of feelings encouraged Follow Up Plan: Telephone follow up appointment with care management team member scheduled for: 11-13-2020 at 1145 am    Task: RNCM: Support Psychosocial Response to Chronic Obstructive Pulmonary Disease Completed 09/04/2020  Outcome:  Positive  Note:   Care Management Activities:    - counseling provided - decision-making supported - depression screen reviewed - emotional support provided - family involvement promoted - problem-solving facilitated - relaxation techniques promoted - verbalization of feelings encouraged        Problem: RNCM: Smoking Cessation   Priority: Medium     Long-Range Goal: RNCM: Smoking Cessation   Start Date: 04/06/2020  Expected End Date: 08/30/2021  This Visit's Progress: On track  Priority: Medium  Note:   Current Barriers:  Unable to independently stop smoking Lacks social connections Does not maintain contact with provider office Does not contact provider office for questions/concerns Tobacco abuse of 55 years; currently smoking .25 ppd pack years 14.25 Previous quit attempts, unsuccessful none successful using non  Reports smoking within 30 minutes of waking up Reports triggers to smoke include: stress and anxiety  Reports motivation to quit smoking includes: knows will help her health but does not desire to quit  On a scale of 1-10, reports MOTIVATION to quit is no desire to quit On a scale of 1-10, reports CONFIDENCE in quitting is no desire to quit Clinical Goal(s):  patient will work with RN Case Passenger transport manager and provider towards tobacco cessation  Interventions: Collaboration with Parks Ranger, [name], DO regarding development and update of comprehensive plan of care as evidenced by provider attestation and co-signature Inter-disciplinary care team collaboration (see longitudinal plan of care) Evaluation of current treatment plan reviewed Provided contact information for [place] [facility] (1-800-QUIT-NOW). Patient will outreach this group for support. Discussed plans with patient for ongoing care management follow up and provided patient with direct contact information for care management team Provided patient with printed smoking  cessation educational materials Referred patient to pharmacy team Provided contact information for [place] [facility] (1-800-QUIT-NOW). Patient will outreach this group for support. Evaluation of current treatment plan reviewed. 09-04-2020: The patient is not ready at this time to stop smoking. Patient Goals/Self-Care Activities patient will:  - activities  as a habit during cravings  - verbally commit to reducing tobacco consumption - barriers to lifestyle changes reviewed and addressed - barriers to treatment reviewed and addressed - healthy lifestyle promoted - modification of home and work environment promoted - rescue (action) plan reviewed - signs/symptoms of infection reviewed - signs/symptoms of worsening disease assessed - symptom triggers identified - treatment plan reviewed Follow Up Plan: Telephone follow up appointment with care management team member scheduled for: 11-13-2020 at 1145 am    Task: RNCM: Identify and Minimize Risk of COPD Exacerbation Completed 09/04/2020  Outcome:  Positive  Note:   Care Management Activities:    - barriers to lifestyle changes reviewed and addressed - barriers to treatment reviewed and addressed - healthy lifestyle promoted - modification of home and work environment promoted - rescue (action) plan reviewed - signs/symptoms of infection reviewed - signs/symptoms of worsening disease assessed - symptom triggers identified - treatment plan reviewed        Care Plan : RNCM: Fall Risk (Adult)  Updates made by Vanita Ingles since 09/04/2020 12:00 AM     Problem: RNCM: Fall Risk   Priority: High     Long-Range Goal: RNCM: Absence of Fall and Fall-Related Injury   Start Date: 04/06/2020  Expected End Date: 07/01/2021  This Visit's Progress: On track  Priority: High  Note:   Current Barriers:  Knowledge Deficits related to fall precautions in patient with multiple chronic conditions.  Decreased adherence to prescribed treatment for fall  prevention Unable to independently manage falls  Lacks social connections Does not contact provider office for questions/concerns Knowledge Deficits related to resources to help with making home safe  Chronic Disease Management support and education needs related to falls prevention and safety  Lacks caregiver support.  Clinical Goal(s):  patient will demonstrate improved adherence to prescribed treatment plan for decreasing falls as evidenced by patient reporting and review of EMR patient will verbalize using fall risk reduction strategies discussed patient will not experience additional falls patient will verbalize understanding of plan for remaining safe in home environment and fall prevention plan. Interventions:  Collaboration with [name], DO regarding development and update of comprehensive plan of care as evidenced by provider attestation and co-signature Inter-disciplinary care team collaboration (see longitudinal plan of care) Provided written and verbal education re: Potential causes of falls and Fall prevention strategies Reviewed medications and discussed potential side effects of medications such as dizziness and frequent urination Assessed for s/s of orthostatic hypotension. 09-04-2020:Review and education today  Assessed for falls since last encounter. Patient had a fall last week, today is 04-06-2020. 09-04-2020: Denies any new falls at this time. Will continue to monitor.  Assessed patients knowledge of fall risk prevention secondary to previously provided education. Assessed working status of life alert bracelet and patient adherence Provided patient information for fall alert systems Evaluation of current treatment plan related to falls prevention and safety  and patient's adherence to plan as established by provider. 09-04-2020: Denies any new falls at this time. Will continue to monitor.  Advised patient to call the office for new falls or injuries  Provided  education to patient re: changing position slowly, reporting increased light headedness or dizziness, and being safe in home environment  Discussed plans with patient for ongoing care management follow up and provided patient with direct contact information for care management team Self-Care Deficits:  Unable to independently prevent falls in her home environment  Lacks social connections Does not maintain contact with provider office Does not contact provider office for questions/concerns Patient Goals:  [place] safety precautions appropriately with all ambulation. Does not use a cane or a walker  - De-clutter walkways - Change positions slowly - Wear secure fitting shoes at all times with ambulation - Utilize home lighting for dim lit areas - Demonstrate self and pet awareness at all times - activities of daily living skills assessed - barriers to physical activity or exercise addressed - barriers to physical activity or exercise identified - barriers to safety identified - cognition assessed - cognitive-stimulating activities promoted -  fall prevention plan reviewed and updated - fear of falling, loss of independence and pain acknowledged - medication list reviewed - modification of home and work environment promoted - vision and/or hearing aid use promoted Follow Up Plan: Telephone follow up appointment with care management team member scheduled for: 11-13-2020 at 1145 am    Task: RNCM: Identify and Manage Contributors to Fall Risk Completed 09/04/2020  Outcome: Positive  Note:   Care Management Activities:    - activities of daily living skills assessed - barriers to physical activity or exercise addressed - barriers to physical activity or exercise identified - barriers to safety identified - cognition assessed - cognitive-stimulating activities promoted - fall prevention plan reviewed and updated - fear of falling, loss of independence and pain acknowledged - medication  list reviewed - modification of home and work environment promoted - vision and/or hearing aid use promoted         Plan:Telephone follow up appointment with care management team member scheduled for:  11-13-2020 at 1145 am  [name] RN, MSN, [employer] Mobile: [phone]

## 2020-09-05 ENCOUNTER — Telehealth: Payer: Self-pay | Admitting: *Deleted

## 2020-09-05 NOTE — Telephone Encounter (Signed)
No problems post procedure. 

## 2020-09-11 ENCOUNTER — Ambulatory Visit: Payer: Medicare Other | Admitting: Pharmacist

## 2020-09-11 DIAGNOSIS — I10 Essential (primary) hypertension: Secondary | ICD-10-CM

## 2020-09-11 DIAGNOSIS — J449 Chronic obstructive pulmonary disease, unspecified: Secondary | ICD-10-CM

## 2020-09-11 NOTE — Patient Instructions (Signed)
Visit Information  PATIENT GOALS:  Goals Addressed             This Visit's Progress    Pharmacy Goals       Our goal bad cholesterol, or LDL, is less than 70. This is why it is important to continue taking your atorvastatin  Please check your home blood pressure, keep a log of the results and bring this with you to your medical appointments.   Feel free to call me with any questions or concerns. I look forward to our next call!   Wallace Cullens, PharmD, [place] [phone]        The patient verbalized understanding of instructions, educational materials, and care plan provided today and declined offer to receive copy of patient instructions, educational materials, and care plan.   Telephone follow up appointment with care management team member scheduled for:

## 2020-09-11 NOTE — Chronic Care Management (AMB) (Signed)
Chronic Care Management Pharmacy Note  09/11/2020 Name:  Beth [name] MRN:  [id number] DOB:  August 31, 1945   Subjective: Beth [name] is an 75 y.o. year old female who is a primary patient of [name], DO.  The CCM team was consulted for assistance with disease management and care coordination needs.    Engaged with patient by telephone for follow up visit in response to provider referral for pharmacy case management and/or care coordination services.   Consent to Services:  The patient was given information about Chronic Care Management services, agreed to services, and gave verbal consent prior to initiation of services.  Please see initial visit note for detailed documentation.   Patient Care Team: [name], DO as PCP - General (Family Medicine) Kate Sable, MD as PCP - Cardiology (Cardiology) [name], MD as Consulting Physician (Pain Medicine) Randel Pigg, MD as Referring Physician (Psychiatry) Curley Spice Virl Diamond, RPH-CPP (Pharmacist) Vanita Ingles, RN as Case Manager (General Practice) Rebekah Chesterfield, [place] as Social Worker (Licensed Clinical Social Worker)  Recent consult visits: Office Visit with [facility] on 7/25 Office Visit with [facility] on 8/1 for follow up  Hospital visits: None in previous 6 months  Objective:  Lab Results  Component Value Date   CREATININE 1.00 (H) 01/14/2020   CREATININE 1.21 (H) 08/17/2019   CREATININE 0.95 (H) 03/23/2019       Component Value Date/Time   CHOL 135 04/10/2018 1115   CHOL 118 03/23/2015 1536   CHOL 185 12/25/2012 1449   TRIG 50 04/10/2018 1115   TRIG 93 12/25/2012 1449   HDL 59 04/10/2018 1115   HDL 44 03/23/2015 1536   HDL 39 (L) 12/25/2012 1449   CHOLHDL 2.3 04/10/2018 1115   VLDL 18 03/21/2016 0936   VLDL 19 12/25/2012 1449   [place] 64 04/10/2018 1115   [place] 127 (H) 12/25/2012 1449    Hepatic  Function Latest Ref Rng & Units 01/14/2020 08/17/2019 03/23/2019  Total Protein 6.1 - 8.1 g/dL 6.3 7.3 6.4  Albumin 3.5 - 5.0 g/dL - 3.9 -  AST 10 - 35 U/L '23 26 15  ' ALT 6 - 29 U/L '27 25 11  ' Alk Phosphatase 38 - 126 U/L - 98 -  Total Bilirubin 0.2 - 1.2 mg/dL 0.6 0.8 0.5  Bilirubin, Direct 0.1 - 0.5 mg/dL - - -    Social History   Tobacco Use  Smoking Status Every Day   Packs/day: 1.00   Years: 57.00   Pack years: 57.00   Types: Cigarettes  Smokeless Tobacco Never  Tobacco Comments   6-8cig daily--08/28/2020   BP Readings from Last 3 Encounters:  09/04/20 117/63  08/28/20 122/78  08/21/20 124/68   Pulse Readings from Last 3 Encounters:  09/04/20 73  08/28/20 81  08/21/20 85   Wt Readings from Last 3 Encounters:  09/04/20 150 lb (68 kg)  08/28/20 160 lb 6.4 oz (72.8 kg)  08/21/20 158 lb (71.7 kg)    Assessment: Review of patient past medical history, allergies, medications, health status, including review of consultants reports, laboratory and other test data, was performed as part of comprehensive evaluation and provision of chronic care management services.   SDOH:  (Social Determinants of Health) assessments and interventions performed: none   CCM Care Plan  Allergies  Allergen Reactions   Bextra  [Valdecoxib]    Compazine [Prochlorperazine Edisylate]     Stroke-like symptoms   Lithium Carbonate  Leg weakness   Lyrica [Pregabalin]     Medications Reviewed Today     Reviewed by Vanita Ingles on 09/04/20 at 1205  Med List Status: <None>   Medication Order Taking? Sig Documenting Provider Last Dose Status Informant  acetaminophen (TYLENOL) 500 MG tablet [id number] No Take 500 mg by mouth 2 (two) times daily as needed. [provider] Taking Active   albuterol (VENTOLIN HFA) 108 (90 Base) MCG/ACT inhaler [id number] No Inhale into the lungs every 6 (six) hours as needed for wheezing or shortness of breath. [provider] Taking Active    aspirin 81 MG chewable tablet [id number] No Chew 81 mg daily by mouth. [provider] Taking Active Pharmacy Records           Med Note Dell Ponto Dec 18, 2016  4:14 PM)    atorvastatin (LIPITOR) 40 MG tablet [id number] No TAKE 1 TABLET BY MOUTH EVERY DAY Jearld Fenton, NP Taking Active   clonazePAM (KLONOPIN) 0.5 MG tablet [id number] No TAKE 1 TABLET(0.5 MG) BY MOUTH EVERY OTHER DAY AS NEEDED FOR ANXIETY Jearld Fenton, NP Taking Active   DULoxetine (CYMBALTA) 20 MG capsule [id number] No TAKE 1 CAPSULE(20 MG) BY MOUTH DAILY [name], DO Taking Active   FLUoxetine (PROZAC) 20 MG capsule [id number] No TAKE 3 CAPSULES(60 MG) BY MOUTH DAILY [name], DO Taking Active   Fluticasone-Umeclidin-Vilant (TRELEGY ELLIPTA) 200-62.5-25 MCG/INH AEPB [id number] No Inhale 1 puff into the lungs daily. [provider] Taking Active   furosemide (LASIX) 20 MG tablet [id number] No Take 1 tablet (20 mg total) by mouth daily. Jearld Fenton, NP Taking Active   gabapentin (NEURONTIN) 300 MG capsule [id number] No Take 2 capsules (600 mg total) by mouth 3 (three) times daily. [name], MD Taking Active   HYDROcodone-acetaminophen (NORCO) 5-325 MG tablet [id number] No Take 1-2 tablets by mouth every 8 (eight) hours as needed for up to 15 days for moderate pain. [name], MD Taking Active   hydrOXYzine (ATARAX/VISTARIL) 25 MG tablet [id number] No Take 25-50 mg by mouth daily as needed. [provider] Taking Active   Melatonin 3 MG CAPS [id number] No Take 10 mg by mouth at bedtime as needed (sleep).  [provider] Taking Active Self  metoprolol succinate (TOPROL-XL) 25 MG 24 hr tablet [id number] No TAKE 1/2 TABLET(12.5 MG) BY MOUTH DAILY Baity, Coralie Keens, NP Taking Active   montelukast (SINGULAIR) 10 MG tablet [id number] No Take 1 tablet (10 mg total) by mouth at bedtime. [name], DO Taking Active    multivitamin-iron-minerals-folic acid (CENTRUM) chewable tablet [id number] No Chew 1 tablet by mouth daily. [provider] Taking Active   QUEtiapine (SEROQUEL) 100 MG tablet [id number] No TAKE 1 TABLET(100 MG) BY MOUTH AT BEDTIME Jearld Fenton, NP Taking Active   Med List Note Rise Patience, RN 02/12/18 1055): Medication agreement signed 02/12/2018 UDS 02/24/17 03/25/17 PA requested for gabapentin sent SB             Patient Active Problem List   Diagnosis Date Noted   Rash 01/14/2020   Pulmonary hypertension ([place]) 09/14/2019   Coccydynia 07/29/2019   Cervicalgia 05/04/2019   Acute pain of left shoulder 05/04/2019   Elbow pain, left 05/04/2019   DOE (dyspnea on exertion) 03/23/2019   Leg swelling 03/22/2019    Class: Acute   Lumbar degenerative disc disease 09/10/2018   Left hip pain 09/10/2018   Post-traumatic osteoarthritis of left elbow 07/14/2018  Lumbar radiculopathy 02/12/2018   Lumbar spondylosis 02/12/2018   Lumbar facet arthropathy 02/12/2018   Atherosclerosis of aorta (HCC) 09/17/2017   Lung nodule 09/17/2017   Urinary tract infection 12/05/2016   Protein-calorie malnutrition, mild (HCC) 08/07/2016   Generalized anxiety disorder 07/15/2016   Chronic pain syndrome 07/15/2016   Severe episode of recurrent major depressive disorder, without psychotic features ([place]) 07/14/2016   Moderate benzodiazepine use disorder ([place]) 05/08/2016   Major depressive disorder, recurrent, severe w/o psychotic behavior ([place]) 05/01/2016   Seasonal allergic rhinitis 03/23/2015   Hyperglycemia 03/23/2015   Senile purpura ([place]) 03/23/2015   Perennial allergic rhinitis with seasonal variation 03/23/2015   Marital problems 10/31/2014   Migraine without aura and without status migrainosus, not intractable 10/31/2014   Chronic bilateral low back pain without sciatica 08/09/2014   Colon polyp 08/09/2014   COPD, severe ([place]) 08/09/2014   CVA, old, hemiparesis ([place]) 08/09/2014    Dyslipidemia 08/09/2014   Dysfunction of eustachian tube 08/09/2014   Fibromyalgia syndrome 08/09/2014   Gastro-esophageal reflux disease without esophagitis 08/09/2014   Benign migrating glossitis 08/09/2014   Cerebrovascular accident, old 08/09/2014   IBS (irritable bowel syndrome) 08/09/2014   Low back pain with radiation 08/09/2014   Chronic recurrent major depressive disorder ([place]) 68/08/8108   Dysmetabolic syndrome 31/59/4585   OP (osteoporosis) 08/09/2014   Vitamin D deficiency 08/09/2014   Smoking 08/09/2014   Benign hypertension 07/19/2013   Benign neoplasm of skin of trunk 06/03/2013   H/O: pneumonia 09/25/2012    Immunization History  Administered Date(s) Administered   Influenza Split 11/13/2006, 11/02/2008   Influenza, High Dose Seasonal PF 09/27/2014, 09/29/2015, 12/30/2016, 10/15/2018, 11/05/2019   Influenza, Seasonal, Injecte, Preservative Fre 11/12/2010   Influenza,inj,Quad PF,6+ Mos 10/08/2012   Influenza-Unspecified 04/08/2014   PFIZER(Purple Top)SARS-COV-2 Vaccination 03/14/2019, 04/09/2019   PPD Test 05/16/2016   Pneumococcal Conjugate-13 09/27/2014   Pneumococcal Polysaccharide-23 06/12/2010   Tdap 03/19/2007, 09/29/2015   Zoster, Live 04/16/2012    Conditions to be addressed/monitored: HTN, HLD and COPD  Care Plan : PharmD - Med Mgmt  Updates made by Rennis Petty, RPH-CPP since 09/11/2020 12:00 AM     Problem: Disease Progression      Long-Range Goal: Disease Progression Prevented or Minimized   Start Date: 04/10/2020  Expected End Date: 07/09/2020  This Visit's Progress: On track  Recent Progress: On track  Priority: High  Note:   Current Barriers:  Chronic Disease Management support, education, and care coordination needs related to previous CVA, COPD, hypertension, hyperlipidemia, depression  Pharmacist Clinical Goal(s):  Over the next 90 days, patient will achieve adherence to monitoring guidelines and medication adherence to  achieve therapeutic efficacy through collaboration with PharmD and provider.   Interventions: 1:1 collaboration with [name], DO regarding development and update of comprehensive plan of care as evidenced by provider attestation and co-signature Inter-disciplinary care team collaboration (see longitudinal plan of care) Perform chart review Office Visit with [facility] on 7/25 Office Visit with [facility] on 8/1 for follow up. Provider advised patient: will recheck 2D echocardiogram will also check overnight oximetry Follow up in 3 months  Moderate to Severe COPD:  Patient followed by [facility] in [place] Current treatment: Trelegy 1 puff daily , rinse after use Patient confirms rinsing after use.  Albuterol inhaler as needed  Hypertension Reports taking: Furosemide 20 mg daily Metoprolol ER 25 mg - 1/2 tablet (12.5 mg ) daily Reports last checked home BP on: 7/29: 116/58, HR 90 Encourage patient  to monitor, keep log of BP and bring record to medical appointments Reports needs help with changing battery in home scale, but has a friend who can help. Encourage patient to have battery changed in scale and restart monitoring weight at home as directed by Nephrologist  Coordination of Care:  Note patient has been trying to get established with Psychiatrist at [facility], but has not heard back from [facility] From review of chart, note that on 8/3 patient and CCM Social Worker discussed referral to [facility] for medication management Today patient unsure if she was to follow up with [facility] Note next follow up with LCSW scheduled for 8/17 Collaborate with LCSW  Patient Goals/Self-Care Activities Over the next 90 days, patient will:  To self administers medications as prescribed Encourage patient to obtain weekly pillbox to use as adherence tool Attends all scheduled  provider appointments Calls pharmacy for medication refills Calls provider office for new concerns or questions  Follow Up Plan: CM Pharmacist will outreach to patient by telephone again within the next 30 days.       Patient's preferred pharmacy is:  [id number] [address], [place] AT [facility] OF [address] [place] [address] Phone: [phone] Fax: [phone]   Follow Up:  Patient agrees to Care Plan and Follow-up.  Wallace Cullens, PharmD, [place], CPP Clinical Pharmacist [facility] [phone]

## 2020-09-13 ENCOUNTER — Ambulatory Visit: Payer: Medicare Other | Admitting: Student in an Organized Health Care Education/Training Program

## 2020-09-13 ENCOUNTER — Telehealth: Payer: Self-pay

## 2020-09-13 ENCOUNTER — Ambulatory Visit: Payer: Self-pay | Admitting: Licensed Clinical Social Worker

## 2020-09-13 DIAGNOSIS — F332 Major depressive disorder, recurrent severe without psychotic features: Secondary | ICD-10-CM

## 2020-09-13 DIAGNOSIS — F411 Generalized anxiety disorder: Secondary | ICD-10-CM

## 2020-09-14 ENCOUNTER — Telehealth: Payer: Self-pay | Admitting: Licensed Clinical Social Worker

## 2020-09-14 ENCOUNTER — Other Ambulatory Visit: Payer: Self-pay | Admitting: Adult Health

## 2020-09-14 NOTE — Chronic Care Management (AMB) (Signed)
  Care Management  Collaboration  Note  09/14/2020 Name: Beth [name] MRN: [id number] DOB: [birth year]  Beth [name] is a 75 y.o. year old female who is a primary care patient of [name], DO. The CCM team was consulted reference care coordination needs for Mental Health Counseling and Resources.  Assessment: Patient was not interviewed or contacted during this encounter. See Care Plan or interventions for patient self-care actives.   Intervention:Conducted brief assessment, recommendations and relevant information discussed. CCM LCSW collaborated with multiple local agencies to assess if they are accepting new patients and are in network with patient's insurance. [facility] is not accepting new patients at this time. [facility] is accepts [employer] and is accepting her insurance. CCM LCSW completed referral via fax.     Follow up Plan:  CCM LCSW will follow up with patient    Review of patient past medical history, allergies, medications, and health status, including review of pertinent consultant reports was performed as part of comprehensive evaluation and provision of care management/care coordination services.   Care Plan Conditions to be addressed/monitored per PCP order: Anxiety and Depression, Mental Health Concerns    There are no care plans that you recently modified to display for this patient.  Rebekah Chesterfield, [place] [facility] Care Management [email or web address] Phone [phone] 10:22 AM

## 2020-09-14 NOTE — Telephone Encounter (Signed)
    Clinical Social Work  Chronic Care Management   Phone Outreach    09/14/2020 Name: Beth [name] MRN: [id number] DOB: Oct 29, 1945  [name] is a 75 y.o. year old female who is a primary care patient of [name], DO .   Reason for referral: [place] [facility].    F/U phone call today to assess needs, progress and barriers with care plan goals.   Telephone outreach was unsuccessful A HIPPA compliant phone message was left for the patient providing contact information and requesting a return call.   Plan:CCM LCSW will wait for return call. If no return call is received, Will reach out to patient again in the next 30 days .   Review of patient status, including review of consultants reports, relevant laboratory and other test results, and collaboration with appropriate care team members and the patient's provider was performed as part of comprehensive patient evaluation and provision of care management services.    Christa See, MSW, [place] [facility] Care Management [email or web address] Phone [phone] 9:58 AM

## 2020-09-19 ENCOUNTER — Ambulatory Visit (INDEPENDENT_AMBULATORY_CARE_PROVIDER_SITE_OTHER): Payer: Medicare Other

## 2020-09-19 VITALS — Ht 65.0 in | Wt 158.0 lb

## 2020-09-19 DIAGNOSIS — Z0000 Encounter for general adult medical examination without abnormal findings: Secondary | ICD-10-CM | POA: Diagnosis not present

## 2020-09-19 NOTE — Patient Instructions (Signed)
Beth [name] , Thank you for taking time to come for your Medicare Wellness Visit. I appreciate your ongoing commitment to your health goals. Please review the following plan we discussed and let me know if I can assist you in the future.   Screening recommendations/referrals: Colonoscopy: completed 05/26/2013 Mammogram: due Bone Density: completed 05/06/2012 Recommended yearly ophthalmology/optometry visit for glaucoma screening and checkup Recommended yearly dental visit for hygiene and checkup  Vaccinations: Influenza vaccine: due Pneumococcal vaccine: completed 09/27/2014 Tdap vaccine: completed 09/29/2015, due 09/28/2025 Shingles vaccine: discussed   Covid-19: 11/05/2019, 04/09/2019, 03/14/2019  Advanced directives: Advance directive discussed with you today.   Conditions/risks identified: smoking  Next appointment: Follow up in one year for your annual wellness visit    Preventive Care 24 Years and Older, Female Preventive care refers to lifestyle choices and visits with your health care provider that can promote health and wellness. What does preventive care include? A yearly physical exam. This is also called an annual well check. Dental exams once or twice a year. Routine eye exams. Ask your health care provider how often you should have your eyes checked. Personal lifestyle choices, including: Daily care of your teeth and gums. Regular physical activity. Eating a healthy diet. Avoiding tobacco and drug use. Limiting alcohol use. Practicing safe sex. Taking low-dose aspirin every day. Taking vitamin and mineral supplements as recommended by your health care provider. What happens during an annual well check? The services and screenings done by your health care provider during your annual well check will depend on your age, overall health, lifestyle risk factors, and family history of disease. Counseling  Your health care provider may ask you questions about your: Alcohol  use. Tobacco use. Drug use. Emotional well-being. Home and relationship well-being. Sexual activity. Eating habits. History of falls. Memory and ability to understand (cognition). Work and work Statistician. Reproductive health. Screening  You may have the following tests or measurements: Height, weight, and BMI. Blood pressure. Lipid and cholesterol levels. These may be checked every 5 years, or more frequently if you are over 26 years old. Skin check. Lung cancer screening. You may have this screening every year starting at age 80 if you have a 30-pack-year history of smoking and currently smoke or have quit within the past 15 years. Fecal occult blood test (FOBT) of the stool. You may have this test every year starting at age 38. Flexible sigmoidoscopy or colonoscopy. You may have a sigmoidoscopy every 5 years or a colonoscopy every 10 years starting at age 38. Hepatitis C blood test. Hepatitis B blood test. Sexually transmitted disease (STD) testing. Diabetes screening. This is done by checking your blood sugar (glucose) after you have not eaten for a while (fasting). You may have this done every 1-3 years. Bone density scan. This is done to screen for osteoporosis. You may have this done starting at age 53. Mammogram. This may be done every 1-2 years. Talk to your health care provider about how often you should have regular mammograms. Talk with your health care provider about your test results, treatment options, and if necessary, the need for more tests. Vaccines  Your health care provider may recommend certain vaccines, such as: Influenza vaccine. This is recommended every year. Tetanus, diphtheria, and acellular pertussis (Tdap, Td) vaccine. You may need a Td booster every 10 years. Zoster vaccine. You may need this after age 39. Pneumococcal 13-valent conjugate (PCV13) vaccine. One dose is recommended after age 65. Pneumococcal polysaccharide (PPSV23) vaccine. One dose is  recommended after age 63. Talk to your health care provider about which screenings and vaccines you need and how often you need them. This information is not intended to replace advice given to you by your health care provider. Make sure you discuss any questions you have with your health care provider. Document Released: 02/10/2015 Document Revised: 10/04/2015 Document Reviewed: 11/15/2014 Elsevier Interactive Patient Education  2017 [place] Prevention in the Home Falls can cause injuries. They can happen to people of all ages. There are many things you can do to make your home safe and to help prevent falls. What can I do on the outside of my home? Regularly fix the edges of walkways and driveways and fix any cracks. Remove anything that might make you trip as you walk through a door, such as a raised step or threshold. Trim any bushes or trees on the path to your home. Use bright outdoor lighting. Clear any walking paths of anything that might make someone trip, such as rocks or tools. Regularly check to see if handrails are loose or broken. Make sure that both sides of any steps have handrails. Any raised decks and porches should have guardrails on the edges. Have any leaves, snow, or ice cleared regularly. Use sand or salt on walking paths during winter. Clean up any spills in your garage right away. This includes oil or grease spills. What can I do in the bathroom? Use night lights. Install grab bars by the toilet and in the tub and shower. Do not use towel bars as grab bars. Use non-skid mats or decals in the tub or shower. If you need to sit down in the shower, use a plastic, non-slip stool. Keep the floor dry. Clean up any water that spills on the floor as soon as it happens. Remove soap buildup in the tub or shower regularly. Attach bath mats securely with double-sided non-slip rug tape. Do not have throw rugs and other things on the floor that can make you  trip. What can I do in the bedroom? Use night lights. Make sure that you have a light by your bed that is easy to reach. Do not use any sheets or blankets that are too big for your bed. They should not hang down onto the floor. Have a firm chair that has side arms. You can use this for support while you get dressed. Do not have throw rugs and other things on the floor that can make you trip. What can I do in the kitchen? Clean up any spills right away. Avoid walking on wet floors. Keep items that you use a lot in easy-to-reach places. If you need to reach something above you, use a strong step stool that has a grab bar. Keep electrical cords out of the way. Do not use floor polish or wax that makes floors slippery. If you must use wax, use non-skid floor wax. Do not have throw rugs and other things on the floor that can make you trip. What can I do with my stairs? Do not leave any items on the stairs. Make sure that there are handrails on both sides of the stairs and use them. Fix handrails that are broken or loose. Make sure that handrails are as long as the stairways. Check any carpeting to make sure that it is firmly attached to the stairs. Fix any carpet that is loose or worn. Avoid having throw rugs at the top or bottom of the stairs. If you  do have throw rugs, attach them to the floor with carpet tape. Make sure that you have a light switch at the top of the stairs and the bottom of the stairs. If you do not have them, ask someone to add them for you. What else can I do to help prevent falls? Wear shoes that: Do not have high heels. Have rubber bottoms. Are comfortable and fit you well. Are closed at the toe. Do not wear sandals. If you use a stepladder: Make sure that it is fully opened. Do not climb a closed stepladder. Make sure that both sides of the stepladder are locked into place. Ask someone to hold it for you, if possible. Clearly mark and make sure that you can  see: Any grab bars or handrails. First and last steps. Where the edge of each step is. Use tools that help you move around (mobility aids) if they are needed. These include: Canes. Walkers. Scooters. Crutches. Turn on the lights when you go into a dark area. Replace any light bulbs as soon as they burn out. Set up your furniture so you have a clear path. Avoid moving your furniture around. If any of your floors are uneven, fix them. If there are any pets around you, be aware of where they are. Review your medicines with your doctor. Some medicines can make you feel dizzy. This can increase your chance of falling. Ask your doctor what other things that you can do to help prevent falls. This information is not intended to replace advice given to you by your health care provider. Make sure you discuss any questions you have with your health care provider. Document Released: 11/10/2008 Document Revised: 06/22/2015 Document Reviewed: 02/18/2014 Elsevier Interactive Patient Education  2017 [employer].

## 2020-09-19 NOTE — Progress Notes (Signed)
I connected with Beth [name] today by telephone and verified that I am speaking with the correct person using two identifiers. Location patient: home Location provider: work Persons participating in the virtual visit: Beth [name], Glenna Durand LPN.   I discussed the limitations, risks, security and privacy concerns of performing an evaluation and management service by telephone and the availability of in person appointments. I also discussed with the patient that there may be a patient responsible charge related to this service. The patient expressed understanding and verbally consented to this telephonic visit.    Interactive audio and video telecommunications were attempted between this provider and patient, however failed, due to patient having technical difficulties OR patient did not have access to video capability.  We continued and completed visit with audio only.     Vital signs may be patient reported or missing.  Subjective:   Beth [name] is a 75 y.o. female who presents for Medicare Annual (Subsequent) preventive examination.  Review of Systems     Cardiac Risk Factors include: advanced age (>24mn, >>31women);dyslipidemia;sedentary lifestyle;smoking/ tobacco exposure     Objective:    Today's Vitals   09/19/20 1329 09/19/20 1330  Weight: 158 lb (71.7 kg)   Height: '5\' 5"'$  (1.651 m)   PainSc:  8    Body mass index is 26.29 kg/m.  Advanced Directives 09/19/2020 08/21/2020 03/27/2020 01/10/2020 07/27/2019 05/10/2019 09/23/2018  Does Patient Have a Medical Advance Directive? No No No No No No No  Would patient like information on creating a medical advance directive? - Yes (MAU/Ambulatory/Procedural Areas - Information given) - Yes (MAU/Ambulatory/Procedural Areas - Information given) - - -  Some encounter information is confidential and restricted. Go to Review Flowsheets activity to see all data.    Current Medications (verified) Outpatient Encounter Medications  as of 09/19/2020  Medication Sig   acetaminophen (TYLENOL) 500 MG tablet Take 500 mg by mouth 2 (two) times daily as needed.   albuterol (VENTOLIN HFA) 108 (90 Base) MCG/ACT inhaler Inhale into the lungs every 6 (six) hours as needed for wheezing or shortness of breath.   aspirin 81 MG chewable tablet Chew 81 mg daily by mouth.   atorvastatin (LIPITOR) 40 MG tablet TAKE 1 TABLET BY MOUTH EVERY DAY   clonazePAM (KLONOPIN) 0.5 MG tablet TAKE 1 TABLET(0.5 MG) BY MOUTH EVERY OTHER DAY AS NEEDED FOR ANXIETY   DULoxetine (CYMBALTA) 20 MG capsule TAKE 1 CAPSULE(20 MG) BY MOUTH DAILY   FLUoxetine (PROZAC) 20 MG capsule TAKE 3 CAPSULES(60 MG) BY MOUTH DAILY   furosemide (LASIX) 20 MG tablet Take 1 tablet (20 mg total) by mouth daily.   gabapentin (NEURONTIN) 300 MG capsule Take 2 capsules (600 mg total) by mouth 3 (three) times daily.   HYDROcodone-acetaminophen (NORCO/VICODIN) 5-325 MG tablet Take 1 tablet by mouth every 6 (six) hours as needed for moderate pain.   hydrOXYzine (ATARAX/VISTARIL) 25 MG tablet Take 25-50 mg by mouth daily as needed.   Melatonin 3 MG CAPS Take 10 mg by mouth at bedtime as needed (sleep).    metoprolol succinate (TOPROL-XL) 25 MG 24 hr tablet TAKE 1/2 TABLET(12.5 MG) BY MOUTH DAILY   montelukast (SINGULAIR) 10 MG tablet Take 1 tablet (10 mg total) by mouth at bedtime.   multivitamin-iron-minerals-folic acid (CENTRUM) chewable tablet Chew 1 tablet by mouth daily.   QUEtiapine (SEROQUEL) 100 MG tablet TAKE 1 TABLET(100 MG) BY MOUTH AT BEDTIME   TRELEGY ELLIPTA 200-62.5-25 MCG/INH AEPB INHALE 1 PUFF INTO THE LUNGS DAILY  No facility-administered encounter medications on file as of 09/19/2020.    Allergies (verified) Bextra  [valdecoxib], Compazine [prochlorperazine edisylate], Lithium carbonate, and Lyrica [pregabalin]   History: Past Medical History:  Diagnosis Date   Allergy    Anxiety    Asthma    Chronic pain syndrome    discharged from pain clinic, hx of  narcotics seeking behavior   COPD (chronic obstructive pulmonary disease) (HCC)    CVA (cerebral infarction)    Depression    Fibromyalgia    Headache    Hyperlipidemia    Hypertension    IBS (irritable bowel syndrome)    Stroke ([place])    Vitamin D deficiency    Past Surgical History:  Procedure Laterality Date   ABDOMINAL HYSTERECTOMY     APPENDECTOMY     BREAST EXCISIONAL BIOPSY  2011   Pt states lump removed, ? side, no scar seen   BREAST SURGERY  2011   biopsy   CERVICAL DISCECTOMY     CHOLECYSTECTOMY     SINUSOTOMY     Family History  Problem Relation Age of Onset   Anxiety disorder Mother    Depression Mother    Breast cancer Mother 47   Cancer Father    Gallbladder disease Father    Alcohol abuse Father    Depression Father    Social History   Socioeconomic History   Marital status: Legally Separated    Spouse name: Not on file   Number of children: 2   Years of education: Not on file   Highest education level: Not on file  Occupational History   Occupation: retired  Tobacco Use   Smoking status: Every Day    Packs/day: 0.25    Years: 57.00    Pack years: 14.25    Types: Cigarettes   Smokeless tobacco: Never   Tobacco comments:    6-8cig daily--08/28/2020  Vaping Use   Vaping Use: Never used  Substance and Sexual Activity   Alcohol use: No    Alcohol/week: 0.0 standard drinks   Drug use: No   Sexual activity: Not Currently  Other Topics Concern   Not on file  Social History Narrative   Not on file   Social Determinants of Health   Financial Resource Strain: Low Risk    Difficulty of Paying Living Expenses: Not hard at all  Food Insecurity: No Food Insecurity   Worried About Charity fundraiser in the Last Year: Never true   Ran Out of Food in the Last Year: Never true  Transportation Needs: No Transportation Needs   Lack of Transportation (Medical): No   Lack of Transportation (Non-Medical): No  Physical Activity: Inactive   Days of  Exercise per Week: 0 days   Minutes of Exercise per Session: 0 min  Stress: No Stress Concern Present   Feeling of Stress : Not at all  Social Connections: Not on file    Tobacco Counseling Ready to quit: Not Answered Counseling given: Not Answered Tobacco comments: 6-8cig daily--08/28/2020   Clinical Intake:  Pre-visit preparation completed: Yes  Pain : 0-10 Pain Score: 8  Pain Type: Chronic pain Pain Location: Back Pain Orientation: Lower Pain Radiating Towards: down left leg Pain Descriptors / Indicators: Shooting Pain Onset: More than a month ago Pain Frequency: Intermittent     Nutritional Status: BMI 25 -29 Overweight Nutritional Risks: None Diabetes: No  How often do you need to have someone help you when you read instructions, pamphlets, or other written  materials from your doctor or pharmacy?: 1 - Never What is the last grade level you completed in 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 no  Interpreter Needed?: No  Information entered by :: NAllen LPN   Activities of Daily Living In your present state of health, do you have any difficulty performing the following activities: 09/19/2020  Hearing? N  Vision? N  Difficulty concentrating or making decisions? N  Walking or climbing stairs? Y  Comment due to back pain  Dressing or bathing? N  Doing errands, shopping? N  Preparing Food and eating ? N  Using the Toilet? N  In the past six months, have you accidently leaked urine? N  Do you have problems with loss of bowel control? Y  Comment once  Managing your Medications? N  Managing your Finances? N  Housekeeping or managing your Housekeeping? N  Some recent data might be hidden    Patient Care Team: KOlin Hauser DO as PCP - General (Family Medicine) AKate Sable MD as PCP - Cardiology (Cardiology) LGillis Santa MD as Consulting Physician (Pain Medicine) ARandel Pigg MD as Referring Physician (Psychiatry) DCurley Spice  EVirl Diamond RPH-CPP (Pharmacist) TVanita Ingles RN as Case Manager (General Practice) LRebekah Chesterfield LCSW as Social Worker (Licensed Clinical Social Worker)  Indicate any recent [place] may have received from other than [facility] providers in the past year (date may be approximate).     Assessment:   [facility]  Hearing/Vision screen Vision Screening - Comments:: No regular eye exams,  Dietary issues and exercise activities discussed: Current Exercise Habits: The patient does not participate in regular exercise at present   Goals Addressed             This Visit's Progress    Patient Stated       09/19/2020, no goals       Depression Screen PHQ 2/9 Scores 09/19/2020 08/21/2020 04/19/2020 07/27/2019 07/09/2019 03/23/2019 03/22/2019  PHQ - 2 Score 0 0 3 0 '2 1 1  '$ PHQ- 9 Score - - 15 - 3 - -  Exception Documentation - - - - - - -    Fall Risk Fall Risk  09/19/2020 09/04/2020 08/21/2020 01/10/2020 07/27/2019  Falls in the past year? 1 0 1 0 1  Comment lost balance - - - -  Number falls in past yr: 1 0 0 - 1  Injury with Fall? 0 - 0 - 0  Comment - - - - -  Risk for fall due to : History of fall(s);Impaired balance/gait;Medication side effect No Fall Risks History of fall(s) - History of fall(s);Medication side effect  Follow up Education provided;Falls evaluation completed;Falls prevention discussed Falls evaluation completed - - Falls evaluation completed;Education provided;Falls prevention discussed    FALL RISK PREVENTION PERTAINING TO THE HOME:  Any stairs in or around the home? No  If so, are there any without handrails?  N/a Home free of loose throw rugs in walkways, pet beds, electrical cords, etc? Yes  Adequate lighting in your home to reduce risk of falls? Yes   ASSISTIVE DEVICES UTILIZED TO PREVENT FALLS:  Life alert? No  Use of a cane, walker or w/c? No  Grab bars in the bathroom? Yes  Shower chair or bench in  shower? Yes  Elevated toilet seat or a handicapped toilet? No   TIMED UP AND GO:  Was the test performed? No .      Cognitive Function:  6CIT Screen 09/19/2020 07/27/2019  What Year? 0 points 0 points  What month? 0 points 0 points  What time? 0 points 0 points  Count back from 20 0 points 0 points  Months in reverse 0 points 2 points  Repeat phrase 4 points 0 points  Total Score 4 2    Immunizations Immunization History  Administered Date(s) Administered   Influenza Split 11/13/2006, 11/02/2008   Influenza, High Dose Seasonal PF 09/27/2014, 09/29/2015, 12/30/2016, 10/15/2018, 11/05/2019   Influenza, Seasonal, Injecte, Preservative Fre 11/12/2010   Influenza,inj,Quad PF,6+ Mos 10/08/2012   Influenza-Unspecified 04/08/2014   PFIZER(Purple Top)SARS-COV-2 Vaccination 03/14/2019, 04/09/2019, 11/05/2019   PPD Test 05/16/2016   Pneumococcal Conjugate-13 09/27/2014   Pneumococcal Polysaccharide-23 06/12/2010   Tdap 03/19/2007, 09/29/2015   Zoster, Live 04/16/2012    TDAP status: Up to date  Flu Vaccine status: Due, Education has been provided regarding the importance of this vaccine. Advised may receive this vaccine at local pharmacy or Health Dept. Aware to provide a copy of the vaccination record if obtained from local pharmacy or Health Dept. Verbalized acceptance and understanding.  Pneumococcal vaccine status: Up to date  Covid-19 vaccine status: Completed vaccines  Qualifies for Shingles Vaccine? Yes   Zostavax completed Yes   Shingrix Completed?: No.    Education has been provided regarding the importance of this vaccine. Patient has been advised to call insurance company to determine out of pocket expense if they have not yet received this vaccine. Advised may also receive vaccine at local pharmacy or Health Dept. Verbalized acceptance and understanding.  Screening Tests Health Maintenance  Topic Date Due   Zoster Vaccines- Shingrix (1 of 2) Never done    COVID-19 Vaccine (4 - Booster for Pfizer series) 02/05/2020   INFLUENZA VACCINE  08/28/2020   TETANUS/TDAP  09/28/2025   DEXA SCAN  Completed   Hepatitis C Screening  Completed   PNA vac Low Risk Adult  Completed   HPV VACCINES  Aged Out    Health Maintenance  Health Maintenance Due  Topic Date Due   Zoster Vaccines- Shingrix (1 of 2) Never done   COVID-19 Vaccine (4 - Booster for Pfizer series) 02/05/2020   INFLUENZA VACCINE  08/28/2020    Colorectal cancer screening: Type of screening: Colonoscopy. Completed 05/26/2013. Repeat every 10 years  Mammogram status: due  Bone Density status: Completed 05/06/2012.  Lung Cancer Screening: (Low Dose CT Chest recommended if Age 66-80 years, 30 pack-year currently smoking OR have quit w/in 15years.) does qualify.   Lung Cancer Screening Referral: CT scan 07/05/2020  Additional Screening:  Hepatitis C Screening: does qualify; Completed 04/27/2012  Vision Screening: Recommended annual ophthalmology exams for early detection of glaucoma and other disorders of the eye. Is the patient up to date with their annual eye exam?  No  Who is the provider or what is the name of the office in which the patient attends annual eye exams? none If pt is not established with a provider, would they like to be referred to a provider to establish care? No .   Dental Screening: Recommended annual dental exams for proper oral hygiene  Community Resource Referral / Chronic Care Management: CRR required this visit?  No   CCM required this visit?  No      Plan:     I have personally reviewed and noted the following in the patient's chart:   Medical and social history Use of alcohol, tobacco or illicit drugs  Current medications and supplements including opioid prescriptions.  Functional ability and status Nutritional status Physical activity Advanced directives List of other physicians Hospitalizations, surgeries, and ER visits in previous 12  months Vitals Screenings to include cognitive, depression, and falls Referrals and appointments  In addition, I have reviewed and discussed with patient certain preventive protocols, quality metrics, and best practice recommendations. A written personalized care plan for preventive services as well as general preventive health recommendations were provided to patient.     Kellie Simmering, LPN   [id number]   Nurse Notes:

## 2020-09-20 ENCOUNTER — Ambulatory Visit: Payer: Medicare Other | Admitting: Student in an Organized Health Care Education/Training Program

## 2020-09-25 ENCOUNTER — Other Ambulatory Visit: Payer: Self-pay

## 2020-09-25 ENCOUNTER — Encounter: Payer: Self-pay | Admitting: Student in an Organized Health Care Education/Training Program

## 2020-09-25 ENCOUNTER — Ambulatory Visit (HOSPITAL_BASED_OUTPATIENT_CLINIC_OR_DEPARTMENT_OTHER): Payer: Medicare Other | Admitting: Student in an Organized Health Care Education/Training Program

## 2020-09-25 ENCOUNTER — Ambulatory Visit
Admission: RE | Admit: 2020-09-25 | Discharge: 2020-09-25 | Disposition: A | Discharge status: Home / Self Care | Payer: Medicare Other | Source: Ambulatory Visit | Attending: Student in an Organized Health Care Education/Training Program | Admitting: Student in an Organized Health Care Education/Training Program

## 2020-09-25 VITALS — BP 110/67 | HR 67 | Temp 97.1°F | Resp 18 | Ht 65.0 in | Wt 158.0 lb

## 2020-09-25 DIAGNOSIS — M47816 Spondylosis without myelopathy or radiculopathy, lumbar region: Secondary | ICD-10-CM | POA: Diagnosis present

## 2020-09-25 DIAGNOSIS — G894 Chronic pain syndrome: Secondary | ICD-10-CM | POA: Diagnosis present

## 2020-09-25 MED ORDER — LIDOCAINE HCL 2 % IJ SOLN
20.0000 mL | Freq: Once | INTRAMUSCULAR | Status: AC
  Administered 2020-09-25: 100 mg

## 2020-09-25 MED ORDER — DEXAMETHASONE SODIUM PHOSPHATE 10 MG/ML IJ SOLN
10.0000 mg | Freq: Once | INTRAMUSCULAR | Status: AC
  Administered 2020-09-25: 10 mg
  Filled 2020-09-25: qty 1

## 2020-09-25 MED ORDER — FENTANYL CITRATE (PF) 100 MCG/2ML IJ SOLN
25.0000 ug | INTRAMUSCULAR | Status: DC | PRN
  Administered 2020-09-25: 75 ug via INTRAVENOUS
  Filled 2020-09-25: qty 2

## 2020-09-25 MED ORDER — ROPIVACAINE HCL 2 MG/ML IJ SOLN
9.0000 mL | Freq: Once | INTRAMUSCULAR | Status: AC
  Administered 2020-09-25: 20 mL via PERINEURAL

## 2020-09-25 NOTE — Progress Notes (Signed)
PROVIDER NOTE: Information contained herein reflects review and annotations entered in association with encounter. Interpretation of such information and data should be left to medically-trained personnel. Information provided to patient can be located elsewhere in the medical record under "Patient Instructions". Document created using STT-dictation technology, any transcriptional errors that may result from process are unintentional.    Patient: Beth [name]  Service Category: Procedure  Provider: [name], MD  DOB: Jun 21, 1945  DOS: 09/25/2020  Location: [facility]  MRN: [id number]  Setting: Ambulatory - outpatient  Referring Provider: Verl Bangs, FNP  Type: Established Patient  Specialty: Interventional Pain Management  PCP: [name], DO   Primary Reason for Visit: Interventional Pain Management Treatment. CC: Back Pain (Left, lower)  Procedure:          Anesthesia, Analgesia, Anxiolysis:  Type: Thermal Lumbar Facet, Medial Branch Radiofrequency Ablation/Neurotomy  #1 Primary Purpose: Therapeutic Region: Posterolateral Lumbosacral Spine Level: L3, L4, L5, Medial Branch Level(s). These levels will denervate the L3-4, L4-5, lumbar facet joints. Laterality: Left  Type: Moderate (Conscious) Sedation combined with Local Anesthesia Indication(s): Analgesia and Anxiety Route: Intravenous (IV) IV Access: Secured Sedation: Meaningful verbal contact was maintained at all times during the procedure  Local Anesthetic: Lidocaine 1-2%  Position: Prone   Indications: 1. Lumbar facet arthropathy   2. Lumbar spondylosis   3. Chronic pain syndrome    Beth [name] has been dealing with the above chronic pain for longer than three months and has either failed to respond, was unable to tolerate, or simply did not get enough benefit from other more conservative therapies including, but not limited to: 1. Over-the-counter medications 2. Anti-inflammatory  medications 3. Muscle relaxants 4. Membrane stabilizers 5. Opioids 6. Physical therapy and/or chiropractic manipulation 7. Modalities (Heat, ice, etc.) 8. Invasive techniques such as nerve blocks. Beth [name] has attained more than 50% relief of the pain from a series of diagnostic injections conducted in separate occasions.  Pain Score: Pre-procedure: 8 /10 Post-procedure: 0-No pain/10  Pre-op Assessment:  Beth [name] is a 75 y.o. (year old), female patient, seen today for interventional treatment. She  has a past surgical history that includes Breast surgery (2011); Cervical discectomy; Appendectomy; Abdominal hysterectomy; Cholecystectomy; Sinusotomy; and Breast excisional biopsy (2011). Beth [name] has a current medication list which includes the following prescription(s): acetaminophen, albuterol, aspirin, atorvastatin, clonazepam, duloxetine, fluoxetine, furosemide, gabapentin, hydrocodone-acetaminophen, hydroxyzine, melatonin, metoprolol succinate, montelukast, multivitamin-iron-minerals-folic acid, quetiapine, and trelegy ellipta, and the following Facility-Administered Medications: fentanyl. Her primarily concern today is the Back Pain (Left, lower)  Initial Vital Signs:  Pulse/HCG Rate: 67ECG Heart Rate: 64 Temp: (!) 96.6 F (35.9 C) Resp: 14 BP: 119/66 SpO2: 99 %  BMI: Estimated body mass index is 26.29 kg/m as calculated from the following:   Height as of this encounter: '5\' 5"'$  (1.651 m).   Weight as of this encounter: 158 lb (71.7 kg).  Risk Assessment: Allergies: Reviewed. She is allergic to bextra  [valdecoxib], compazine [prochlorperazine edisylate], lithium carbonate, and lyrica [pregabalin].  Allergy Precautions: None required Coagulopathies: Reviewed. None identified.  Blood-thinner therapy: None at this time Active Infection(s): Reviewed. None identified. Beth [name] is afebrile  Site Confirmation: Beth [name] was asked to confirm the procedure and  laterality before marking the site Procedure checklist: Completed Consent: Before the procedure and under the influence of no sedative(s), amnesic(s), or anxiolytics, the patient was informed of the treatment options, risks and possible complications. To fulfill our ethical and legal obligations, as recommended by the  American Medical Association's Code of Ethics, I have informed the patient of my clinical impression; the nature and purpose of the treatment or procedure; the risks, benefits, and possible complications of the intervention; the alternatives, including doing nothing; the risk(s) and benefit(s) of the alternative treatment(s) or procedure(s); and the risk(s) and benefit(s) of doing nothing. The patient was provided information about the general risks and possible complications associated with the procedure. These may include, but are not limited to: failure to achieve desired goals, infection, bleeding, organ or nerve damage, allergic reactions, paralysis, and death. In addition, the patient was informed of those risks and complications associated to Spine-related procedures, such as failure to decrease pain; infection (i.e.: Meningitis, epidural or intraspinal abscess); bleeding (i.e.: epidural hematoma, subarachnoid hemorrhage, or any other type of intraspinal or peri-dural bleeding); organ or nerve damage (i.e.: Any type of peripheral nerve, nerve root, or spinal cord injury) with subsequent damage to sensory, motor, and/or autonomic systems, resulting in permanent pain, numbness, and/or weakness of one or several areas of the body; allergic reactions; (i.e.: anaphylactic reaction); and/or death. Furthermore, the patient was informed of those risks and complications associated with the medications. These include, but are not limited to: allergic reactions (i.e.: anaphylactic or anaphylactoid reaction(s)); adrenal axis suppression; blood sugar elevation that in diabetics may result in  ketoacidosis or comma; water retention that in patients with history of congestive heart failure may result in shortness of breath, pulmonary edema, and decompensation with resultant heart failure; weight gain; swelling or edema; medication-induced neural toxicity; particulate matter embolism and blood vessel occlusion with resultant organ, and/or nervous system infarction; and/or aseptic necrosis of one or more joints. Finally, the patient was informed that Medicine is not an exact science; therefore, there is also the possibility of unforeseen or unpredictable risks and/or possible complications that may result in a catastrophic outcome. The patient indicated having understood very clearly. We have given the patient no guarantees and we have made no promises. Enough time was given to the patient to ask questions, all of which were answered to the patient's satisfaction. Ms. Hainsworth has indicated that she wanted to continue with the procedure. Attestation: I, the ordering provider, attest that I have discussed with the patient the benefits, risks, side-effects, alternatives, likelihood of achieving goals, and potential problems during recovery for the procedure that I have provided informed consent. Date  Time: 09/25/2020  9:01 AM  Pre-Procedure Preparation:  Monitoring: As per clinic protocol. Respiration, ETCO2, SpO2, BP, heart rate and rhythm monitor placed and checked for adequate function Safety Precautions: Patient was assessed for positional comfort and pressure points before starting the procedure. Time-out: I initiated and conducted the "Time-out" before starting the procedure, as per protocol. The patient was asked to participate by confirming the accuracy of the "Time Out" information. Verification of the correct person, site, and procedure were performed and confirmed by me, the nursing staff, and the patient. "Time-out" conducted as per Joint Commission's Universal Protocol  (UP.01.01.01). Time: 0953  Description of Procedure:          Laterality: Left Levels:   L3, L4, L5, Medial Branch Level(s), at the L3-4, L4-5 lumbar facet joints. Area Prepped: Lumbosacral DuraPrep (Iodine Povacrylex [0.7% available iodine] and Isopropyl Alcohol, 74% w/w) Safety Precautions: Aspiration looking for blood return was conducted prior to all injections. At no point did we inject any substances, as a needle was being advanced. Before injecting, the patient was told to immediately notify me if she was experiencing any new  onset of "ringing in the ears, or metallic taste in the mouth". No attempts were made at seeking any paresthesias. Safe injection practices and needle disposal techniques used. Medications properly checked for expiration dates. SDV (single dose vial) medications used. After the completion of the procedure, all disposable equipment used was discarded in the proper designated medical waste containers. Local Anesthesia: Protocol guidelines were followed. The patient was positioned over the fluoroscopy table. The area was prepped in the usual manner. The time-out was completed. The target area was identified using fluoroscopy. A 12-in long, straight, sterile hemostat was used with fluoroscopic guidance to locate the targets for each level blocked. Once located, the skin was marked with an approved surgical skin marker. Once all sites were marked, the skin (epidermis, dermis, and hypodermis), as well as deeper tissues (fat, connective tissue and muscle) were infiltrated with a small amount of a short-acting local anesthetic, loaded on a 10cc syringe with a 25G, 1.5-in  Needle. An appropriate amount of time was allowed for local anesthetics to take effect before proceeding to the next step. Local Anesthetic: Lidocaine 2.0% The unused portion of the local anesthetic was discarded in the proper designated containers. Technical explanation of process:  Radiofrequency Ablation  (RFA)  L3 Medial Branch Nerve RFA: The target area for the L3 medial branch is at the junction of the postero-lateral aspect of the superior articular process and the superior, posterior, and medial edge of the transverse process of L4. Under fluoroscopic guidance, a Radiofrequency needle was inserted until contact was made with os over the superior postero-lateral aspect of the pedicular shadow (target area). Sensory and motor testing was conducted to properly adjust the position of the needle. Once satisfactory placement of the needle was achieved, the numbing solution was slowly injected after negative aspiration for blood. 2.0 mL of the nerve block solution was injected without difficulty or complication. After waiting for at least 3 minutes, the ablation was performed. Once completed, the needle was removed intact. L4 Medial Branch Nerve RFA: The target area for the L4 medial branch is at the junction of the postero-lateral aspect of the superior articular process and the superior, posterior, and medial edge of the transverse process of L5. Under fluoroscopic guidance, a Radiofrequency needle was inserted until contact was made with os over the superior postero-lateral aspect of the pedicular shadow (target area). Sensory and motor testing was conducted to properly adjust the position of the needle. Once satisfactory placement of the needle was achieved, the numbing solution was slowly injected after negative aspiration for blood. 2.0 mL of the nerve block solution was injected without difficulty or complication. After waiting for at least 3 minutes, the ablation was performed. Once completed, the needle was removed intact. L5 Medial Branch Nerve RFA: The target area for the L5 medial branch is at the junction of the postero-lateral aspect of the superior articular process of S1 and the superior, posterior, and medial edge of the sacral ala. Under fluoroscopic guidance, a Radiofrequency needle was inserted  until contact was made with os over the superior postero-lateral aspect of the pedicular shadow (target area). Sensory and motor testing was conducted to properly adjust the position of the needle. Once satisfactory placement of the needle was achieved, the numbing solution was slowly injected after negative aspiration for blood. 2.0 mL of the nerve block solution was injected without difficulty or complication. After waiting for at least 3 minutes, the ablation was performed. Once completed, the needle was removed intact.  Radiofrequency  lesioning (ablation):  Radiofrequency Generator: NeuroTherm [id number] Sensory Stimulation Parameters: 50 Hz was used to locate & identify the nerve, making sure that the needle was positioned such that there was no sensory stimulation below 0.3 V or above 0.7 V. Motor Stimulation Parameters: 2 Hz was used to evaluate the motor component. Care was taken not to lesion any nerves that demonstrated motor stimulation of the lower extremities at an output of less than 2.5 times that of the sensory threshold, or a maximum of 2.0 V. Lesioning Technique Parameters: Standard Radiofrequency settings. (Not bipolar or pulsed.) Temperature Settings: 80 degrees C Lesioning time: 60 seconds Intra-operative Compliance: Compliant Materials & Medications: Needle(s) (Electrode/Cannula) Type: Teflon-coated, curved tip, Radiofrequency needle(s) Gauge: 22G Length: 10cm Numbing solution: 6 cc solution made of 5 cc of 0.2% ropivacaine, 1 cc of Decadron 10 mg/cc.  2 cc injected at each level above on the left prior to lesioning after sensorimotor testing.    Once the entire procedure was completed, the treated area was cleaned, making sure to leave some of the prepping solution back to take advantage of its long term bactericidal properties.  Illustration of the posterior view of the lumbar spine and the posterior neural structures. Laminae of L2 through S1 are labeled. DPRL5, dorsal primary  ramus of L5; DPRS1, dorsal primary ramus of S1; DPR3, dorsal primary ramus of L3; FJ, facet (zygapophyseal) joint L3-L4; I, inferior articular process of L4; LB1, lateral branch of dorsal primary ramus of L1; IAB, inferior articular branches from L3 medial branch (supplies L4-L5 facet joint); IBP, intermediate branch plexus; MB3, medial branch of dorsal primary ramus of L3; NR3, third lumbar nerve root; S, superior articular process of L5; SAB, superior articular branches from L4 (supplies L4-5 facet joint also); TP3, transverse process of L3.  Vitals:   09/25/20 1009 09/25/20 1020 09/25/20 1031 09/25/20 1043  BP: (!) 123/59 128/61 122/60 110/67  Pulse:      Resp: '18 18 18 18  '$ Temp:  (!) 97 F (36.1 C)  (!) 97.1 F (36.2 C)  TempSrc:      SpO2: 100% 99% 96% 96%  Weight:      Height:       Start Time: 0953 hrs. End Time: 1008 hrs.  Imaging Guidance (Spinal):          Type of Imaging Technique: Fluoroscopy Guidance (Spinal) Indication(s): Assistance in needle guidance and placement for procedures requiring needle placement in or near specific anatomical locations not easily accessible without such assistance. Exposure Time: Please see nurses notes. Contrast: None used. Fluoroscopic Guidance: I was personally present during the use of fluoroscopy. "Tunnel Vision Technique" used to obtain the best possible view of the target area. Parallax error corrected before commencing the procedure. "Direction-depth-direction" technique used to introduce the needle under continuous pulsed fluoroscopy. Once target was reached, antero-posterior, oblique, and lateral fluoroscopic projection used confirm needle placement in all planes. Images permanently stored in EMR. Interpretation: No contrast injected. I personally interpreted the imaging intraoperatively. Adequate needle placement confirmed in multiple planes. Permanent images saved into the patient's record.  Post-operative Assessment:  Post-procedure  Vital Signs:  Pulse/HCG Rate: 6765 Temp: (!) 97.1 F (36.2 C) Resp: 18 BP: 110/67 SpO2: 96 %  EBL: None  Complications: No immediate post-treatment complications observed by team, or reported by patient.  Note: The patient tolerated the entire procedure well. A repeat set of vitals were taken after the procedure and the patient was kept under observation following institutional policy, for this type  of procedure. Post-procedural neurological assessment was performed, showing return to baseline, prior to discharge. The patient was provided with post-procedure discharge instructions, including a section on how to identify potential problems. Should any problems arise concerning this procedure, the patient was given instructions to immediately contact us, at any time, without hesitation. In any case, we plan to contact the patient by telephone for a follow-up status report regarding this interventional procedure.  Comments:  No additional relevant information.  Plan of Care  Orders:  Orders Placed This Encounter  Procedures   DG PAIN CLINIC C-ARM 1-60 MIN NO REPORT    Intraoperative interpretation by procedural physician at [place].    Standing Status:   Standing    Number of Occurrences:   1    Order Specific Question:   Reason for exam:    Answer:   Assistance in needle guidance and placement for procedures requiring needle placement in or near specific anatomical locations not easily accessible without such assistance.     Medications ordered for procedure: Meds ordered this encounter  Medications   lidocaine (XYLOCAINE) 2 % (with pres) injection 400 mg   dexamethasone (DECADRON) injection 10 mg   ropivacaine (PF) 2 mg/mL (0.2%) (NAROPIN) injection 9 mL   fentaNYL (SUBLIMAZE) injection 25-50 mcg    Make sure Narcan is available in the pyxis when using this medication. In the event of respiratory depression (RR< 8/min): Titrate NARCAN (naloxone) in increments of 0.1  to 0.2 mg IV at 2-3 minute intervals, until desired degree of reversal.     Medications administered: We administered lidocaine, dexamethasone, ropivacaine (PF) 2 mg/mL (0.2%), and fentaNYL.  See the medical record for exact dosing, route, and time of administration.  Follow-up plan:   Return in about 8 weeks (around 11/20/2020) for Post Procedure Evaluation, virtual.      Status post bilateral facet medial branch nerve blocks L3, L4, L5 on 09/03/2017, helpful for axial low back and buttock pain: Repeat PRN.  Status post right L4-L5 epidural steroid injection on 03/09/2018, 07/29/2018 which was helpful for her right-sided hip and leg pain.  Right L4-L5 ESI #3 on 09/23/2027 not as helpful.  Right L3, L4, L5 RFA on 07/07/2019, 09/04/20 . Left 09/25/20     Recent Visits Date Type Provider Dept  09/04/20 Procedure visit [name], MD [facility]  08/21/20 Office Visit [name], MD [facility]  Showing recent visits within past 90 days and meeting all other requirements Today's Visits Date Type Provider Dept  09/25/20 Procedure visit [name], MD [facility]  Showing today's visits and meeting all other requirements Future Appointments Date Type Provider Dept  11/20/20 Appointment [name], MD [facility]  Showing future appointments within next 90 days and meeting all other requirements  Disposition: Discharge home  Discharge (Date  Time): 09/25/2020; 1406 hrs.   Primary Care Physician: [name], DO Location: [facility] Note by: [name], MD Date: 09/25/2020; Time: 11:17 AM  Disclaimer:  Medicine is not an exact science. The only guarantee in medicine is that nothing is guaranteed. It is important to note that the decision to proceed with this intervention was based on the information collected from the patient. The Data and conclusions were drawn from the patient's questionnaire, the  interview, and the physical examination. Because the information was provided in large part by the patient, it cannot be guaranteed that it has not been purposely or unconsciously manipulated. Every effort  has been made to obtain as much relevant data as possible for this evaluation. It is important to note that the conclusions that lead to this procedure are derived in large part from the available data. Always take into account that the treatment will also be dependent on availability of resources and existing treatment guidelines, considered by other Pain Management Practitioners as being common knowledge and practice, at the time of the intervention. For Medico-Legal purposes, it is also important to point out that variation in procedural techniques and pharmacological choices are the acceptable norm. The indications, contraindications, technique, and results of the above procedure should only be interpreted and judged by a Board-Certified Interventional Pain Specialist with extensive familiarity and expertise in the same exact procedure and technique.

## 2020-09-25 NOTE — Patient Instructions (Addendum)

## 2020-09-25 NOTE — Progress Notes (Signed)
Safety precautions to be maintained throughout the outpatient stay will include: orient to surroundings, keep bed in low position, maintain call bell within reach at all times, provide assistance with transfer out of bed and ambulation.  

## 2020-09-26 ENCOUNTER — Telehealth: Payer: Self-pay

## 2020-09-26 NOTE — Telephone Encounter (Signed)
Post procedure phone call.  Patient states she is doing ok today.

## 2020-09-27 ENCOUNTER — Ambulatory Visit: Payer: Medicare Other | Admitting: Student in an Organized Health Care Education/Training Program

## 2020-09-27 ENCOUNTER — Telehealth: Payer: Self-pay

## 2020-09-27 DIAGNOSIS — I10 Essential (primary) hypertension: Secondary | ICD-10-CM

## 2020-09-27 DIAGNOSIS — F339 Major depressive disorder, recurrent, unspecified: Secondary | ICD-10-CM

## 2020-09-27 DIAGNOSIS — F332 Major depressive disorder, recurrent severe without psychotic features: Secondary | ICD-10-CM

## 2020-09-27 DIAGNOSIS — J449 Chronic obstructive pulmonary disease, unspecified: Secondary | ICD-10-CM | POA: Diagnosis not present

## 2020-09-27 DIAGNOSIS — E785 Hyperlipidemia, unspecified: Secondary | ICD-10-CM

## 2020-09-27 NOTE — Telephone Encounter (Signed)
Copied from [place] [id number]. Topic: General - Other >> Sep 27, 2020  9:37 AM Leward Quan A wrote: Reason for CRM: Leah with [employer] [facility] called in to inquire about a clearance form faxed over on 09/26/20 to be completed by Dr Raliegh Ip and returned patient have an appointment on 09/28/20. Please call Denny Peon or Loma Sousa to let them know if form was received and how they can obtain it before appointment tomorrow 09/28/20. Can be reached at Ph# [phone]   09/27/20 at 3:50 - I spoke with Loma Sousa who send an attachment and a fax of the clearance letter. I have received it and had Dr. Raliegh Ip sign off on the procedure. Their office is aware.

## 2020-10-05 ENCOUNTER — Other Ambulatory Visit: Payer: Self-pay | Admitting: Family Medicine

## 2020-10-05 ENCOUNTER — Other Ambulatory Visit: Payer: Self-pay | Admitting: Internal Medicine

## 2020-10-05 DIAGNOSIS — F411 Generalized anxiety disorder: Secondary | ICD-10-CM

## 2020-10-05 DIAGNOSIS — J3089 Other allergic rhinitis: Secondary | ICD-10-CM

## 2020-10-05 DIAGNOSIS — J302 Other seasonal allergic rhinitis: Secondary | ICD-10-CM

## 2020-10-05 DIAGNOSIS — F5104 Psychophysiologic insomnia: Secondary | ICD-10-CM

## 2020-10-05 NOTE — Telephone Encounter (Signed)
Requested medication (s) are due for refill today- yes  Requested medication (s) are on the active medication list -yes  Future visit scheduled -yes  Last refill: 08/18/20  Notes to clinic: Request Rf: non delegated Rx  Requested Prescriptions  Pending Prescriptions Disp Refills   clonazePAM (KLONOPIN) 0.5 MG tablet [Pharmacy Med Name: CLONAZEPAM 0.'5MG'$  TABLETS] 15 tablet     Sig: TAKE 1 TABLET(0.5 MG) BY MOUTH EVERY OTHER DAY AS NEEDED FOR ANXIETY     Not Delegated - Psychiatry:  Anxiolytics/Hypnotics Failed - 10/05/2020 10:26 AM      Failed - This refill cannot be delegated      Failed - Urine Drug Screen completed in last 360 days      Passed - Valid encounter within last 6 months    Recent Outpatient Visits           5 months ago Major depressive disorder, recurrent, severe w/o psychotic behavior ([place])   [facility] [place], [name], DO   8 months ago [facility] [place], Lupita Raider, [place]   1 year ago Benign hypertension   [facility], Lupita Raider, FNP   1 year ago Leg swelling   [facility], Lupita Raider, FNP   1 year ago Leg swelling   [facility], Lupita Raider, FNP       Future Appointments             In 11 months [facility], [facility]                Requested Prescriptions  Pending Prescriptions Disp Refills   clonazePAM (KLONOPIN) 0.5 MG tablet [Pharmacy Med Name: CLONAZEPAM 0.'5MG'$  TABLETS] 15 tablet     Sig: TAKE 1 TABLET(0.5 MG) BY MOUTH EVERY OTHER DAY AS NEEDED FOR ANXIETY     Not Delegated - Psychiatry:  Anxiolytics/Hypnotics Failed - 10/05/2020 10:26 AM      Failed - This refill cannot be delegated      Failed - Urine Drug Screen completed in last 360 days      Passed - Valid encounter within last 6 months    Recent Outpatient Visits           5 months ago Major depressive disorder, recurrent, severe w/o psychotic behavior ([place])    [facility] [place], [name], DO   8 months ago [facility] [place], Lupita Raider, FNP   1 year ago Benign hypertension   [place], FNP   1 year ago Leg swelling   [facility], Lupita Raider, FNP   1 year ago Leg swelling   [facility], Lupita Raider, FNP       Future Appointments             In 17 months [facility], [facility]

## 2020-10-09 ENCOUNTER — Telehealth: Payer: Self-pay | Admitting: Pharmacist

## 2020-10-09 ENCOUNTER — Telehealth: Payer: Medicare Other

## 2020-10-09 NOTE — Telephone Encounter (Signed)
  Chronic Care Management   Outreach Note  10/09/2020 Name: Beth [name] MRN: [id number] DOB: July 14, 1945  Referred by: [name], DO Reason for referral : No chief complaint on file.   Was unable to reach patient via telephone today and have left HIPAA compliant voicemail asking patient to return my call.    Follow Up Plan: Will collaborate with Care Guide to outreach to schedule follow up with me  [name], PharmD, [place] [facility] [phone]

## 2020-10-11 ENCOUNTER — Other Ambulatory Visit: Payer: Self-pay

## 2020-10-11 ENCOUNTER — Ambulatory Visit (INDEPENDENT_AMBULATORY_CARE_PROVIDER_SITE_OTHER): Payer: Medicare Other

## 2020-10-11 DIAGNOSIS — R0600 Dyspnea, unspecified: Secondary | ICD-10-CM | POA: Diagnosis not present

## 2020-10-11 DIAGNOSIS — R0609 Other forms of dyspnea: Secondary | ICD-10-CM

## 2020-10-11 LAB — ECHOCARDIOGRAM COMPLETE
AR max vel: 2.04 cm2
AV Area VTI: 2.07 cm2
AV Area mean vel: 1.88 cm2
AV Mean grad: 4 mmHg
AV Peak grad: 6 mmHg
Ao pk vel: 1.22 m/s
Area-P 1/2: 4.68 cm2
Calc EF: 57.7 %
S' Lateral: 3.5 cm
Single Plane A2C EF: 54.6 %
Single Plane A4C EF: 61 %

## 2020-10-12 ENCOUNTER — Other Ambulatory Visit: Payer: Self-pay | Admitting: Internal Medicine

## 2020-10-12 ENCOUNTER — Other Ambulatory Visit: Payer: Self-pay | Admitting: Family Medicine

## 2020-10-12 ENCOUNTER — Telehealth: Payer: Self-pay | Admitting: Pulmonary Disease

## 2020-10-12 DIAGNOSIS — F5104 Psychophysiologic insomnia: Secondary | ICD-10-CM

## 2020-10-12 DIAGNOSIS — F332 Major depressive disorder, recurrent severe without psychotic features: Secondary | ICD-10-CM

## 2020-10-12 NOTE — Telephone Encounter (Signed)
Requested medication (s) are due for refill today: yes  Requested medication (s) are on the active medication list: yes  Last refill:  08/07/20 #30 1 refill  Future visit scheduled: yes  Notes to clinic:  not delegated per protocol     Requested Prescriptions  Pending Prescriptions Disp Refills   QUEtiapine (SEROQUEL) 100 MG tablet [Pharmacy Med Name: QUETIAPINE '100MG'$  TABLETS] 30 tablet 1    Sig: TAKE 1 TABLET(100 MG) BY MOUTH AT BEDTIME     Not Delegated - Psychiatry:  Antipsychotics - Second Generation (Atypical) - quetiapine Failed - 10/12/2020  5:22 PM      Failed - This refill cannot be delegated      Failed - ALT in normal range and within 180 days    ALT  Date Value Ref Range Status  01/14/2020 27 6 - 29 U/L Final   SGPT (ALT)  Date Value Ref Range Status  12/20/2012 14 12 - 78 U/L Final          Failed - AST in normal range and within 180 days    AST  Date Value Ref Range Status  01/14/2020 23 10 - 35 U/L Final   SGOT(AST)  Date Value Ref Range Status  12/20/2012 23 15 - 37 Unit/L Final          Passed - Completed PHQ-2 or PHQ-9 in the last 360 days      Passed - Last BP in normal range    BP Readings from Last 1 Encounters:  09/25/20 110/67          Passed - Valid encounter within last 6 months    Recent Outpatient Visits           5 months ago Major depressive disorder, recurrent, severe w/o psychotic behavior ([place])   [facility] [name], DO   9 months ago [facility] [place], Lupita Raider, FNP   1 year ago Benign hypertension   [place], FNP   1 year ago Leg swelling   [facility], Lupita Raider, FNP   1 year ago Leg swelling   [facility], Lupita Raider, FNP       Future Appointments             In 11 months [facility], [facility]

## 2020-10-12 NOTE — Telephone Encounter (Signed)
[name], MD  Claudette Head A, CMA Her echocardiogram shows that she has significant elevation of the artery going from the heart to the lungs.  She also has a stiff main chamber.  The elevation on the pressure of this artery going from the heart to the lungs is due to her COPD and also to the stiffness of her heart.  She was supposed to get an overnight oximetry, that she get this done?   Lm for patient.

## 2020-10-13 NOTE — Telephone Encounter (Signed)
Spoke to Dominica with [employer]. Jodi Mourning stated that order had be voided due to non contact. She will reopen order. [employer] will contact patient to schedule.  Patient is aware and voiced her understanding.  Nothing further needed.

## 2020-10-13 NOTE — Telephone Encounter (Signed)
Patient is aware of results and voiced her understanding.  Order was placed to [facility] on 08/28/2020 for ONO. Patient stated that [employer] has not contacted her.  Lm for Melissa with [employer] for f/u on ONO.

## 2020-10-13 NOTE — Telephone Encounter (Signed)
Pt has been rescheduled. 

## 2020-10-13 NOTE — Telephone Encounter (Signed)
Received call from [facility].  Per Melissa, [facility] attempted to contact patient without success. Patient can contact the scheduling department at [phone] ext [phone] to schedule.  Spoke to patient and provided contact info.  Patient is aware and voiced her understanding. Nothing further needed.

## 2020-10-16 ENCOUNTER — Telehealth: Payer: Self-pay | Admitting: Student in an Organized Health Care Education/Training Program

## 2020-10-16 NOTE — Telephone Encounter (Signed)
Needs appt for Hydrocodone refill.

## 2020-10-25 ENCOUNTER — Other Ambulatory Visit: Payer: Self-pay | Admitting: Family Medicine

## 2020-10-25 DIAGNOSIS — F332 Major depressive disorder, recurrent severe without psychotic features: Secondary | ICD-10-CM

## 2020-10-25 NOTE — Telephone Encounter (Signed)
Future visit in 11 months

## 2020-10-26 ENCOUNTER — Telehealth: Payer: Self-pay | Admitting: Pulmonary Disease

## 2020-10-26 DIAGNOSIS — J449 Chronic obstructive pulmonary disease, unspecified: Secondary | ICD-10-CM

## 2020-10-26 NOTE — Telephone Encounter (Signed)
ONO reviewed by Dr. Dietrich Pates 2L QHS.  Patient is aware of results and voiced her understanding.  Order has been placed to [facility].  Nothing further needed.

## 2020-10-27 ENCOUNTER — Ambulatory Visit (INDEPENDENT_AMBULATORY_CARE_PROVIDER_SITE_OTHER): Payer: Medicare Other | Admitting: Pharmacist

## 2020-10-27 DIAGNOSIS — I10 Essential (primary) hypertension: Secondary | ICD-10-CM | POA: Diagnosis not present

## 2020-10-27 DIAGNOSIS — J449 Chronic obstructive pulmonary disease, unspecified: Secondary | ICD-10-CM | POA: Diagnosis not present

## 2020-10-27 NOTE — Patient Instructions (Signed)
Visit Information  PATIENT GOALS:  Goals Addressed             This Visit's Progress    Pharmacy Goals       Our goal bad cholesterol, or LDL, is less than 70. This is why it is important to continue taking your atorvastatin  Please check your home blood pressure, keep a log of the results and bring this with you to your medical appointments.  Please obtain new battery for your scale  Feel free to call me with any questions or concerns. I look forward to our next call!   Wallace Cullens, PharmD, [place] [phone]        The patient verbalized understanding of instructions, educational materials, and care plan provided today and declined offer to receive copy of patient instructions, educational materials, and care plan.   Telephone follow up appointment with care management team member scheduled for: 11/29/2020 at 11:15 AM

## 2020-10-27 NOTE — Chronic Care Management (AMB) (Signed)
Chronic Care Management Pharmacy Note  10/27/2020 Name:  Beth [name] MRN:  [id number] DOB:  12/25/1945   Subjective: Beth [name] is an 75 y.o. year old female who is a primary patient of [name], DO.  The CCM team was consulted for assistance with disease management and care coordination needs.    Engaged with patient by telephone for follow up visit in response to provider referral for pharmacy case management and/or care coordination services.   Consent to Services:  The patient was given information about Chronic Care Management services, agreed to services, and gave verbal consent prior to initiation of services.  Please see initial visit note for detailed documentation.   Patient Care Team: [name], DO as PCP - General (Family Medicine) Kate Sable, MD as PCP - Cardiology (Cardiology) [name], MD as Consulting Physician (Pain Medicine) Randel Pigg, MD as Referring Physician (Psychiatry) Curley Spice Virl Diamond, RPH-CPP (Pharmacist) Vanita Ingles, RN as Case Manager (General Practice) Rebekah [place], [place] as Social Worker (Licensed Clinical Social Worker)   Hospital visits: None in previous 6 months  Objective:  Lab Results  Component Value Date   CREATININE 1.00 (H) 01/14/2020   CREATININE 1.21 (H) 08/17/2019   CREATININE 0.95 (H) 03/23/2019       Component Value Date/Time   CHOL 135 04/10/2018 1115   CHOL 118 03/23/2015 1536   CHOL 185 12/25/2012 1449   TRIG 50 04/10/2018 1115   TRIG 93 12/25/2012 1449   HDL 59 04/10/2018 1115   HDL 44 03/23/2015 1536   HDL 39 (L) 12/25/2012 1449   CHOLHDL 2.3 04/10/2018 1115   VLDL 18 03/21/2016 0936   VLDL 19 12/25/2012 1449   LDLCALC 64 04/10/2018 1115   [place] 127 (H) 12/25/2012 1449    Hepatic Function Latest Ref Rng & Units 01/14/2020 08/17/2019 03/23/2019  Total Protein 6.1 - 8.1 g/dL 6.3 7.3 6.4  Albumin 3.5 - 5.0 g/dL - 3.9 -  AST 10 - 35 U/L _0 ALT 6 - 29 U/L _1 Alk Phosphatase 38 - 126 U/L - 98 -  Total Bilirubin 0.2 - 1.2 mg/dL 0.6 0.8 0.5  Bilirubin, Direct 0.1 - 0.5 mg/dL - - -     Social History   Tobacco Use  Smoking Status Every Day   Packs/day: 0.25   Years: 57.00   Pack years: 14.25   Types: Cigarettes  Smokeless Tobacco Never  Tobacco Comments   6-8cig daily--08/28/2020   BP Readings from Last 3 Encounters:  09/25/20 110/67  09/04/20 117/63  08/28/20 122/78   Pulse Readings from Last 3 Encounters:  09/25/20 67  09/04/20 73  08/28/20 81   Wt Readings from Last 3 Encounters:  09/25/20 158 lb (71.7 kg)  09/19/20 158 lb (71.7 kg)  09/04/20 150 lb (68 kg)    Assessment: Review of patient past medical history, allergies, medications, health status, including review of consultants reports, laboratory and other test data, was performed as part of comprehensive evaluation and provision of chronic care management services.   SDOH:  (Social Determinants of Health) assessments and interventions performed: none   CCM Care Plan  Allergies  Allergen Reactions   Bextra  [Valdecoxib]    Compazine [Prochlorperazine Edisylate]     Stroke-like symptoms   Lithium Carbonate     Leg weakness   Lyrica [Pregabalin]     Medications Reviewed Today     Reviewed by Wallace Cullens  A, RPH-CPP (Pharmacist) on 10/27/20 at 1018  Med List Status: <None>   Medication Order Taking? Sig Documenting Provider Last Dose Status Informant  acetaminophen (TYLENOL) 500 MG tablet [id number]  Take 500 mg by mouth 2 (two) times daily as needed. [provider]  Active   albuterol (VENTOLIN HFA) 108 (90 Base) MCG/ACT inhaler [id number] Yes Inhale into the lungs every 6 (six) hours as needed for wheezing or shortness of breath. [provider] Taking Active   aspirin 81 MG chewable tablet [id number]  Chew 81 mg daily by mouth. [provider]  Active Pharmacy Records           Med Note Dell [place]onto Dec 18, 2016  4:14 PM)    atorvastatin (LIPITOR) 40 MG tablet [id number] Yes TAKE 1 TABLET BY MOUTH EVERY DAY Jearld Fenton, NP Taking Active   clonazePAM (KLONOPIN) 0.5 MG tablet [id number]  TAKE 1 TABLET(0.5 MG) BY MOUTH EVERY OTHER DAY AS NEEDED FOR ANXIETY [name], DO  Active   DULoxetine (CYMBALTA) 20 MG capsule [id number]  TAKE 1 CAPSULE(20 MG) BY MOUTH DAILY [name], DO  Active   FLUoxetine (PROZAC) 20 MG capsule [id number]  TAKE 3 CAPSULES(60 MG) BY MOUTH DAILY [place] Ranger, [name], DO  Active   furosemide (LASIX) 20 MG tablet [id number] Yes Take 1 tablet (20 mg total) by mouth daily. Jearld Fenton, NP Taking Active   gabapentin (NEURONTIN) 300 MG capsule [id number]  Take 2 capsules (600 mg total) by mouth 3 (three) times daily. [name], MD  Active   HYDROcodone-acetaminophen (NORCO/VICODIN) 5-325 MG tablet [id number]  Take 1 tablet by mouth every 6 (six) hours as needed for moderate pain. [provider]  Active Self  hydrOXYzine (ATARAX/VISTARIL) 25 MG tablet [id number]  Take 25-50 mg by mouth daily as needed. [provider]  Active   Melatonin 3 MG CAPS [id number]  Take 10 mg by mouth at bedtime as needed (sleep).  [provider]  Active Self  metoprolol succinate (TOPROL-XL) 25 MG 24 hr tablet [id number] Yes TAKE 1/2 TABLET(12.5 MG) BY MOUTH DAILY Jearld Fenton, NP Taking Active   montelukast (SINGULAIR) 10 MG tablet [id number]  TAKE 1 TABLET(10 MG) BY MOUTH AT BEDTIME [name], DO  Active   multivitamin-iron-minerals-folic acid (CENTRUM) chewable tablet [id number]  Chew 1 tablet by mouth daily. [provider]  Active   QUEtiapine (SEROQUEL) 100 MG tablet [id number]  TAKE 1 TABLET(100 MG) BY MOUTH AT BEDTIME Andee Poles  Active   TRELEGY ELLIPTA 200-62.5-25 MCG/INH AEPB [id number] Yes INHALE 1 PUFF INTO THE LUNGS DAILY [name], MD Taking Active   Med List  Note Rise Patience, RN 02/12/18 1055): Medication agreement signed 02/12/2018 UDS 02/24/17 03/25/17 PA requested for gabapentin sent SB             Patient Active Problem List   Diagnosis Date Noted   Rash 01/14/2020   Pulmonary hypertension ([place]) 09/14/2019   Coccydynia 07/29/2019   Cervicalgia 05/04/2019   Acute pain of left shoulder 05/04/2019   Elbow pain, left 05/04/2019   DOE (dyspnea on exertion) 03/23/2019   Leg swelling 03/22/2019    Class: Acute   Lumbar degenerative disc disease 09/10/2018   Left hip pain 09/10/2018   Post-traumatic osteoarthritis of left elbow 07/14/2018   Lumbar radiculopathy 02/12/2018   Lumbar spondylosis 02/12/2018   Lumbar facet arthropathy 02/12/2018   Atherosclerosis of aorta ([place]) 09/17/2017   Lung  nodule 09/17/2017   Urinary tract infection 12/05/2016   Protein-calorie malnutrition, mild (HCC) 08/07/2016   Generalized anxiety disorder 07/15/2016   Chronic pain syndrome 07/15/2016   Severe episode of recurrent major depressive disorder, without psychotic features ([place]) 07/14/2016   Moderate benzodiazepine use disorder ([place]) 05/08/2016   Major depressive disorder, recurrent, severe w/o psychotic behavior ([place]) 05/01/2016   Seasonal allergic rhinitis 03/23/2015   Hyperglycemia 03/23/2015   Senile purpura ([place]) 03/23/2015   Perennial allergic rhinitis with seasonal variation 03/23/2015   Marital problems 10/31/2014   Migraine without aura and without status migrainosus, not intractable 10/31/2014   Chronic bilateral low back pain without sciatica 08/09/2014   Colon polyp 08/09/2014   COPD, severe ([place]) 08/09/2014   CVA, old, hemiparesis ([place]) 08/09/2014   Dyslipidemia 08/09/2014   Dysfunction of eustachian tube 08/09/2014   Fibromyalgia syndrome 08/09/2014   Gastro-esophageal reflux disease without esophagitis 08/09/2014   Benign migrating glossitis 08/09/2014   Cerebrovascular accident, old 08/09/2014   IBS (irritable bowel  syndrome) 08/09/2014   Low back pain with radiation 08/09/2014   Chronic recurrent major depressive disorder ([place]) 07/24/9483   Dysmetabolic syndrome 46/27/0350   OP (osteoporosis) 08/09/2014   Vitamin D deficiency 08/09/2014   Smoking 08/09/2014   Benign hypertension 07/19/2013   Benign neoplasm of skin of trunk 06/03/2013   H/O: pneumonia 09/25/2012    Immunization History  Administered Date(s) Administered   Influenza Split 11/13/2006, 11/02/2008   Influenza, High Dose Seasonal PF 09/27/2014, 09/29/2015, 12/30/2016, 10/15/2018, 11/05/2019   Influenza, Seasonal, Injecte, Preservative Fre 11/12/2010   Influenza,inj,Quad PF,6+ Mos 10/08/2012   Influenza-Unspecified 04/08/2014   PFIZER(Purple Top)SARS-COV-2 Vaccination 03/14/2019, 04/09/2019, 11/05/2019   PPD Test 05/16/2016   Pneumococcal Conjugate-13 09/27/2014   Pneumococcal Polysaccharide-23 06/12/2010   Tdap 03/19/2007, 09/29/2015   Zoster, Live 04/16/2012    Conditions to be addressed/monitored: HTN, HLD and COPD  Care Plan : PharmD - Med Mgmt  Updates made by Rennis Petty, RPH-CPP since 10/27/2020 12:00 AM     Problem: Disease Progression      Long-Range Goal: Disease Progression Prevented or Minimized   Start Date: 04/10/2020  Expected End Date: 07/09/2020  This Visit's Progress: On track  Recent Progress: On track  Priority: High  Note:   Current Barriers:  Chronic Disease Management support, education, and care coordination needs related to previous CVA, COPD, hypertension, hyperlipidemia, depression  Pharmacist Clinical Goal(s):  Over the next 90 days, patient will achieve adherence to monitoring guidelines and medication adherence to achieve therapeutic efficacy through collaboration with PharmD and provider.   Interventions: 1:1 collaboration with [name], DO regarding development and update of comprehensive plan of care as evidenced by provider attestation and  co-signature Inter-disciplinary care team collaboration (see longitudinal plan of care) Today patient reports completed overnight oximetry test last week and received call yesterday from Pulmonology advising her to start using overnight oxygen. From review of chart, per telephone note from yesterday, [place] placed order for patient to receive oxygen from [employer] Patient confirms will listen for call from [employer]  Moderate to Severe COPD:  Patient followed by [facility] in [place] Current treatment: Trelegy 1 puff daily , rinse after use Patient confirms rinsing after use.  Albuterol inhaler as needed Planning to start overnight oxygen as directed by Pulmonology  Hypertension Reports taking: Furosemide 20 mg daily Metoprolol ER 25 mg - 1/2 tablet (12.5 mg ) daily Reports last checked home BP on: 9/26: 122/59, HR 68 Encourage patient to monitor, keep  log of BP and bring record to medical appointments Reports needs help with changing battery in home scale, but has a friend who can help. Again encourage patient to have battery changed in scale and restart monitoring weight at home as directed by Nephrologist  Coordination of Care:  Note patient has been trying to get established with Psychiatrist at [facility], but has not heard back from [employer] From review of chart, note patient and CCM Education officer, museum discussed referral to [employer] for medication management Encourage patient to call to follow up with CCM Social Worker today about getting established with new psychiatrist  Patient Goals/Self-Care Activities Over the next 90 days, patient will:  To self administers medications as prescribed Encourage patient to obtain weekly pillbox to use as adherence tool Attends all scheduled provider appointments Calls pharmacy for medication refills Calls provider office for new concerns or questions  Follow Up Plan: CM  Pharmacist will outreach to patient by telephone next on 11/29/2020 at 11:15 AM      Patient's preferred pharmacy is:  [facility] [place], [address] AT [address] [place] [address] Phone: [phone] Fax: [phone]   Follow Up:  Patient agrees to Care Plan and Follow-up.  Wallace Cullens, PharmD, [place], CPP Clinical Pharmacist [facility] [phone]

## 2020-11-07 ENCOUNTER — Other Ambulatory Visit: Payer: Self-pay

## 2020-11-07 ENCOUNTER — Ambulatory Visit
Payer: Medicare Other | Attending: Student in an Organized Health Care Education/Training Program | Admitting: Student in an Organized Health Care Education/Training Program

## 2020-11-07 ENCOUNTER — Encounter: Payer: Self-pay | Admitting: Student in an Organized Health Care Education/Training Program

## 2020-11-07 VITALS — BP 119/59 | HR 67 | Temp 96.6°F | Resp 15 | Ht 65.0 in | Wt 150.0 lb

## 2020-11-07 DIAGNOSIS — M7918 Myalgia, other site: Secondary | ICD-10-CM | POA: Diagnosis not present

## 2020-11-07 DIAGNOSIS — G894 Chronic pain syndrome: Secondary | ICD-10-CM | POA: Diagnosis not present

## 2020-11-07 DIAGNOSIS — M47816 Spondylosis without myelopathy or radiculopathy, lumbar region: Secondary | ICD-10-CM

## 2020-11-07 DIAGNOSIS — M25522 Pain in left elbow: Secondary | ICD-10-CM

## 2020-11-07 MED ORDER — HYDROCODONE-ACETAMINOPHEN 5-325 MG PO TABS: 1.0000 | ORAL_TABLET | Freq: Every day | ORAL | 0 refills | Status: DC | PRN

## 2020-11-07 MED ORDER — GABAPENTIN 300 MG PO CAPS: 600.0000 mg | ORAL_CAPSULE | Freq: Three times a day (TID) | ORAL | 5 refills | Status: DC

## 2020-11-07 NOTE — Progress Notes (Signed)
Nursing Pain Medication Assessment:  Safety precautions to be maintained throughout the outpatient stay will include: orient to surroundings, keep bed in low position, maintain call bell within reach at all times, provide assistance with transfer out of bed and ambulation.  Medication Inspection Compliance: Beth [name] did not comply with our request to bring her pills to be counted. She was reminded that bringing the medication bottles, even when empty, is a requirement.  Medication: None brought in. Pill/Patch Count: None available to be counted. Bottle Appearance: No container available. Did not bring bottle(s) to appointment. Filled Date: N/A Last Medication intake:  Ran out of medicine more than 48 hours ago Last dose 2 months ago which is when pt. States she ran out.

## 2020-11-07 NOTE — Progress Notes (Signed)
PROVIDER NOTE: Information contained herein reflects review and annotations entered in association with encounter. Interpretation of such information and data should be left to medically-trained personnel. Information provided to patient can be located elsewhere in the medical record under "Patient Instructions". Document created using STT-dictation technology, any transcriptional errors that may result from process are unintentional.    Patient: Beth [name]  Service Category: E/M  Provider: [name], MD  DOB: [birth year]  DOS: 11/07/2020  Specialty: Interventional Pain Management  MRN: [id number]  Setting: Ambulatory outpatient  PCP: [name], DO  Type: Established Patient    Referring Provider: Nobie Putnam *  Location: Office  Delivery: Face-to-face     HPI  Beth [name], a 75 y.o. year old female, is here today because of her Lumbar facet arthropathy [M47.816]. Beth [name]'s primary complain today is Back Pain (Lower/center) Last encounter: My last encounter with her was on 09/25/20 Pertinent problems: Beth [name] has Fibromyalgia syndrome; Lumbar spondylosis; Lumbar facet arthropathy; Post-traumatic osteoarthritis of left elbow; Lumbar degenerative disc disease; Elbow pain, left; and Coccydynia on their pertinent problem list. Pain Assessment: Severity of Chronic pain is reported as a 7 /10. Location: Back Lower/up to mid back. Onset: More than a month ago. Quality: Aching (worse in early morning and late afternoon/evening). Timing: Constant. Modifying factor(s): meds. Vitals:  height is $RemoveB'5\' 5"'BvGxoArQ$  (1.651 m) and weight is 150 lb (68 kg). Her temporal temperature is 96.6 F (35.9 C) (abnormal). Her blood pressure is 119/59 (abnormal) and her pulse is 67. Her respiration is 15 and oxygen saturation is 96%.   Reason for encounter: both, medication management and post-procedure assessment.  Overall [place] states that she is doing very well after her lumbar  radiofrequency ablation and states that her deeper spinal pain has significantly improved.  She is having more superficial pain that is musculoskeletal.  It is tender to palpation and there are palpable knots.  We discussed trigger point injections in these regions which the patient is amenable to.  Refill of hydrocodone, gabapentin as below.  Post-Procedure Evaluation  Procedure (09/25/2020):  L3, L4, L5 RFA, Left: 09/25/2020.  Right: 09/04/2020  Anxiolysis: Please see nurses note.  Effectiveness during initial hour after procedure (Ultra-Short Term Relief): 100 %   Local anesthetic used: Long-acting (4-6 hours) Effectiveness: Defined as any analgesic benefit obtained secondary to the administration of local anesthetics. This carries significant diagnostic value as to the etiological location, or anatomical origin, of the pain. Duration of benefit is expected to coincide with the duration of the local anesthetic used.  Effectiveness during initial 4-6 hours after procedure (Short-Term Relief): 100 %   Long-term benefit: Defined as any relief past the pharmacologic duration of the local anesthetics.  Effectiveness past the initial 6 hours after procedure (Long-Term Relief): 90 % (X 2weeks, now 75% better)   Benefits, current: Defined as benefit present at the time of this evaluation.   Analgesia:  75% Function: Somewhat improved ROM: Somewhat improved  Pharmacotherapy Assessment  Analgesic: Small quantity of hydrocodone 5 mg twice daily as needed for breakthrough pain   Monitoring:  PMP: PDMP reviewed during this encounter.       Pharmacotherapy: No side-effects or adverse reactions reported. Compliance: No problems identified. Effectiveness: Clinically acceptable.  Rise Patience, RN  11/07/2020  2:05 PM  Sign when Signing Visit Nursing Pain Medication Assessment:  Safety precautions to be maintained throughout the outpatient stay will include: orient to surroundings, keep bed in low  position, maintain call bell  within reach at all times, provide assistance with transfer out of bed and ambulation.  Medication Inspection Compliance: Beth [name] did not comply with our request to bring her pills to be counted. She was reminded that bringing the medication bottles, even when empty, is a requirement.  Medication: None brought in. Pill/Patch Count: None available to be counted. Bottle Appearance: No container available. Did not bring bottle(s) to appointment. Filled Date: N/A Last Medication intake:  Ran out of medicine more than 48 hours ago Last dose 2 months ago which is when pt. States she ran out.     UDS:  Summary  Date Value Ref Range Status  02/24/2017 FINAL  Final    Comment:    ==================================================================== [facility] COMP DRUG ANALYSIS,UR ==================================================================== Test                             Result       Flag       Units Drug Present and Declared for Prescription Verification   Gabapentin                     PRESENT      EXPECTED   Aripiprazole                   PRESENT      EXPECTED   Metoprolol                     PRESENT      EXPECTED Drug Present not Declared for Prescription Verification   7-aminoclonazepam              368          UNEXPECTED ng/mg creat    7-aminoclonazepam is an expected metabolite of clonazepam. Source    of clonazepam is a scheduled prescription medication.   Citalopram                     PRESENT      UNEXPECTED   Desmethylcitalopram            PRESENT      UNEXPECTED    Desmethylcitalopram is an expected metabolite of citalopram or    the enantiomeric form, escitalopram.   Hydroxyzine                    PRESENT      UNEXPECTED Drug Absent but Declared for Prescription Verification   Duloxetine                     Not Detected UNEXPECTED   Acetaminophen                  Not Detected UNEXPECTED    Acetaminophen, as indicated in the declared  medication list, is    not always detected even when used as directed.   Salicylate                     Not Detected UNEXPECTED    Aspirin, as indicated in the declared medication list, is not    always detected even when used as directed. ==================================================================== Test                      Result    Flag   Units      Ref Range   Creatinine  62               mg/dL      >=20 ==================================================================== Declared Medications:  The flagging and interpretation on this report are based on the  following declared medications.  Unexpected results may arise from  inaccuracies in the declared medications.  **Note: The testing scope of this panel includes these medications:  Aripiprazole  Duloxetine  Gabapentin  Metoprolol  **Note: The testing scope of this panel does not include small to  moderate amounts of these reported medications:  Acetaminophen  Aspirin (Aspirin 81)  **Note: The testing scope of this panel does not include following  reported medications:  Albuterol (Ipratropium-Albuterol)  Atorvastatin  Azelastine  Fluticasone  Fluticasone (Breo)  Ipratropium (Ipratropium-Albuterol)  Multivitamin (MVI)  Vilanterol (Breo) ==================================================================== For clinical consultation, please call [phone]. ====================================================================      ROS  Constitutional: Denies any fever or chills Gastrointestinal: No reported hemesis, hematochezia, vomiting, or acute GI distress Musculoskeletal:  improved low back pain, improved pain with facet loading Neurological: No reported episodes of acute onset apraxia, aphasia, dysarthria, agnosia, amnesia, paralysis, loss of coordination, or loss of consciousness  Medication Review  DULoxetine, FLUoxetine, Fluticasone-Umeclidin-Vilant, HYDROcodone-acetaminophen, Melatonin,  QUEtiapine, acetaminophen, albuterol, aspirin, atorvastatin, clonazePAM, furosemide, gabapentin, hydrOXYzine, metoprolol succinate, montelukast, and multivitamin-iron-minerals-folic acid  History Review  Allergy: Beth [name] is allergic to bextra  [valdecoxib], compazine [prochlorperazine edisylate], lithium carbonate, and lyrica [pregabalin]. Drug: Beth [name]  reports no history of drug use. Alcohol:  reports no history of alcohol use. Tobacco:  reports that she quit smoking about 2 weeks ago. Her smoking use included cigarettes. She has a 14.25 pack-year smoking history. She has never used smokeless tobacco. Social: Beth [name]  reports that she quit smoking about 2 weeks ago. Her smoking use included cigarettes. She has a 14.25 pack-year smoking history. She has never used smokeless tobacco. She reports that she does not drink alcohol and does not use drugs. Medical:  has a past medical history of Allergy, Anxiety, Asthma, Chronic pain syndrome, COPD (chronic obstructive pulmonary disease) ([place]), CVA (cerebral infarction), Depression, Fibromyalgia, Headache, Hyperlipidemia, Hypertension, IBS (irritable bowel syndrome), Stroke ([place]), and Vitamin D deficiency. Surgical: Beth [name]  has a past surgical history that includes Breast surgery (2011); Cervical discectomy; Appendectomy; Abdominal hysterectomy; Cholecystectomy; Sinusotomy; and Breast excisional biopsy (2011). Family: family history includes Alcohol abuse in her father; Anxiety disorder in her mother; Breast cancer (age of onset: 69) in her mother; Cancer in her father; Depression in her father and mother; Gallbladder disease in her father.  Laboratory Chemistry Profile   Renal Lab Results  Component Value Date   BUN 28 (H) 01/14/2020   CREATININE 1.00 (H) 01/14/2020   BCR 28 (H) 01/14/2020   GFRAA 63 01/14/2020   GFRNONAA 55 (L) 01/14/2020    Hepatic Lab Results  Component Value Date   AST 23 01/14/2020   ALT 27  01/14/2020   ALBUMIN 3.9 08/17/2019   ALKPHOS 98 08/17/2019   LIPASE 27 02/13/2016    Electrolytes Lab Results  Component Value Date   NA 143 01/14/2020   K 4.0 01/14/2020   CL 103 01/14/2020   CALCIUM 9.5 01/14/2020   MG 1.8 12/25/2012    Bone Lab Results  Component Value Date   VD25OH 37 03/21/2016    Inflammation (CRP: Acute Phase) (ESR: Chronic Phase) Lab Results  Component Value Date   CRP 0.3 03/21/2016   ESRSEDRATE 1 03/21/2016   LATICACIDVEN 1.3 12/23/2015  Note: Above Lab results reviewed.  Recent Imaging Review  ECHOCARDIOGRAM COMPLETE    ECHOCARDIOGRAM REPORT       Patient Name:   KASHAY CAVENAUGH Date of Exam: 10/11/2020 Medical Rec #:  [id number]        Height:       65.0 in Accession #:    [id number]       Weight:       158.0 lb Date of Birth:  [birth year]        BSA:          1.790 m Patient Age:    36 years         BP:           128/74 mmHg Patient Gender: F                HR:           78 bpm. Exam Location:  [facility]  Procedure: 2D Echo, 3D Echo, Cardiac Doppler, Color Doppler and Strain Analysis  Indications:    R06.02 SOB; R01.1 Murmur   History:        Patient has prior history of Echocardiogram examinations, most                 recent 04/29/2019. Pulmonary HTN, COPD and Stroke,                 Signs/Symptoms:Shortness of Breath and Murmur; Risk                 Factors:Hypertension, Dyslipidemia and Current Smoker.   Sonographer:    Pilar Jarvis RDMS, RVT, RDCS Referring Phys: [id number] CARMEN L GONZALEZ  IMPRESSIONS   1. Left ventricular ejection fraction, by estimation, is 55 to 60%. Left ventricular ejection fraction by 3D volume is 65 %. The left ventricle has normal function. The left ventricle has no regional wall motion abnormalities. Left ventricular diastolic  parameters are consistent with Grade II diastolic dysfunction (pseudonormalization). The average left ventricular global longitudinal strain is -19.3 %. The global  longitudinal strain is normal.  2. Right ventricular systolic function is normal. The right ventricular size is normal. Mildly increased right ventricular wall thickness. There is severely elevated pulmonary artery systolic pressure. The estimated right ventricular systolic pressure  is 55.7 mmHg.  3. Left atrial size was severely dilated.  4. The mitral valve is normal in structure. Moderate mitral valve regurgitation.  5. Tricuspid valve regurgitation is moderate.  6. The aortic valve is tricuspid. Aortic valve regurgitation is mild. Mild to moderate aortic valve sclerosis/calcification is present, without any evidence of aortic stenosis.  7. The inferior vena cava is dilated in size with >50% respiratory variability, suggesting right atrial pressure of 8 mmHg.  FINDINGS  Left Ventricle: Left ventricular ejection fraction, by estimation, is 55 to 60%. Left ventricular ejection fraction by 3D volume is 65 %. The left ventricle has normal function. The left ventricle has no regional wall motion abnormalities. The average  left ventricular global longitudinal strain is -19.3 %. The global longitudinal strain is normal. The left ventricular internal cavity size was normal in size. There is no left ventricular hypertrophy. Left ventricular diastolic parameters are consistent  with Grade II diastolic dysfunction (pseudonormalization).  Right Ventricle: The right ventricular size is normal. Mildly increased right ventricular wall thickness. Right ventricular systolic function is normal. There is severely elevated pulmonary artery systolic pressure. The tricuspid regurgitant velocity is  3.61 m/s, and with an assumed right atrial pressure of  8 mmHg, the estimated right ventricular systolic pressure is 11.8 mmHg.  Left Atrium: Left atrial size was severely dilated.  Right Atrium: Right atrial size was normal in size.  Pericardium: There is no evidence of pericardial effusion.  Mitral Valve: The  mitral valve is normal in structure. Moderate mitral valve regurgitation.  Tricuspid Valve: The tricuspid valve is normal in structure. Tricuspid valve regurgitation is moderate.  Aortic Valve: The aortic valve is tricuspid. Aortic valve regurgitation is mild. Mild to moderate aortic valve sclerosis/calcification is present, without any evidence of aortic stenosis. Aortic valve mean gradient measures 4.0 mmHg. Aortic valve peak  gradient measures 6.0 mmHg. Aortic valve area, by VTI measures 2.07 cm.  Pulmonic Valve: The pulmonic valve was not well visualized. Pulmonic valve regurgitation is not visualized.  Aorta: The aortic root and ascending aorta are structurally normal, with no evidence of dilitation.  Venous: The inferior vena cava is dilated in size with greater than 50% respiratory variability, suggesting right atrial pressure of 8 mmHg.  IAS/Shunts: No atrial level shunt detected by color flow Doppler.    LEFT VENTRICLE PLAX 2D LVIDd:         4.80 cm         Diastology LVIDs:         3.50 cm         LV e' medial:    9.36 cm/s LV PW:         0.90 cm         LV E/e' medial:  14.9 LV IVS:        0.90 cm         LV e' lateral:   8.92 cm/s LVOT diam:     1.90 cm         LV E/e' lateral: 15.6 LV SV:         58 LV SV Index:   32              2D LVOT Area:     2.84 cm        Longitudinal                                Strain                                2D Strain GLS  -19.3 % LV Volumes (MOD)               Avg: LV vol d, MOD    63.0 ml A2C:                           3D Volume EF LV vol d, MOD    67.7 ml       LV 3D EF:    Left A4C:                                        ventricul LV vol s, MOD    28.6 ml                    ar A2C:  ejection LV vol s, MOD    26.4 ml                    fraction A4C:                                        by 3D LV SV MOD A2C:   34.4 ml                    volume is LV SV MOD A4C:   67.7 ml                     65 %. LV SV MOD BP:    38.2 ml                                  3D Volume EF:                                3D EF:        65 %                                LV EDV:       103 ml                                LV ESV:       36 ml                                LV SV:        67 ml  RIGHT VENTRICLE             IVC RV Basal diam:  3.00 cm     IVC diam: 2.60 cm RV S prime:     15.20 cm/s TAPSE (M-mode): 2.3 cm  LEFT ATRIUM             Index       RIGHT ATRIUM           Index LA diam:        4.20 cm 2.35 cm/m  RA Area:     15.00 cm LA Vol (A2C):   87.0 ml 48.62 ml/m RA Volume:   35.70 ml  19.95 ml/m LA Vol (A4C):   92.3 ml 51.58 ml/m LA Biplane Vol: 92.3 ml 51.58 ml/m  AORTIC VALVE                   PULMONIC VALVE AV Area (Vmax):    2.04 cm    PV Vmax:       0.84 m/s AV Area (Vmean):   1.88 cm    PV Peak grad:  2.8 mmHg AV Area (VTI):     2.07 cm AV Vmax:           122.00 cm/s AV Vmean:          89.900 cm/s AV VTI:            0.281 m AV Peak Grad:      6.0 mmHg AV Mean Grad:  4.0 mmHg LVOT Vmax:         87.90 cm/s LVOT Vmean:        59.700 cm/s LVOT VTI:          0.205 m LVOT/AV VTI ratio: 0.73   AORTA Ao Root diam: 2.90 cm Ao Asc diam:  3.00 cm Ao Arch diam: 2.2 cm  MITRAL VALVE                TRICUSPID VALVE MV Area (PHT): 4.68 cm     TR Peak grad:   52.1 mmHg MV Decel Time: 162 msec     TR Vmax:        361.00 cm/s MV E velocity: 139.00 cm/s MV A velocity: 72.70 cm/s   SHUNTS MV E/A ratio:  1.91         Systemic VTI:  0.20 m                             Systemic Diam: 1.90 cm  Kate Sable MD Electronically signed by Kate Sable MD Signature Date/Time: 10/11/2020/3:35:38 PM      Final   Note: Reviewed        Physical Exam  General appearance: Well nourished, well developed, and well hydrated. In no apparent acute distress Mental status: Alert, oriented x 3 (person, place, & time)       Respiratory: No evidence of acute respiratory  distress Eyes: PERLA Vitals: BP (!) 119/59   Pulse 67   Temp (!) 96.6 F (35.9 C) (Temporal)   Resp 15   Ht $R'5\' 5"'Xa$  (1.651 m)   Wt 150 lb (68 kg)   SpO2 96%   BMI 24.96 kg/m  BMI: Estimated body mass index is 24.96 kg/m as calculated from the following:   Height as of this encounter: $RemoveBeforeD'5\' 5"'BuQjSYgAiHblJV$  (1.651 m).   Weight as of this encounter: 150 lb (68 kg). Ideal: Ideal body weight: 57 kg (125 lb 10.6 oz) Adjusted ideal body weight: 61.4 kg (135 lb 6.4 oz)   Lumbar Spine Area Exam  Skin & Axial Inspection: No masses, redness, or swelling Alignment: Symmetrical Functional ROM: Pain restricted ROM       IMPROVED Stability: No instability detected Muscle Tone/Strength: Functionally intact. No obvious neuro-muscular anomalies detected. Sensory (Neurological): Musculoskeletal pain pattern IMPROVED Palpation: No palpable anomalies       Provocative Tests: Hyperextension/rotation test: (+) bilaterally for facet joint pain. IMPROVED Lumbar quadrant test (Kemp's test): (+) bilaterally for facet joint pain. IMPROVED   Lower Extremity Exam    Side: Right lower extremity  Side: Left lower extremity  Stability: No instability observed          Stability: No instability observed          Skin & Extremity Inspection: Skin color, temperature, and hair growth are WNL. No peripheral edema or cyanosis. No masses, redness, swelling, asymmetry, or associated skin lesions. No contractures.  Skin & Extremity Inspection: Skin color, temperature, and hair growth are WNL. No peripheral edema or cyanosis. No masses, redness, swelling, asymmetry, or associated skin lesions. No contractures.  Functional ROM: Pain restricted ROM                  Functional ROM: Pain restricted ROM                  Muscle Tone/Strength: Functionally intact. No obvious neuro-muscular anomalies detected.  Muscle Tone/Strength: Functionally intact. No obvious neuro-muscular anomalies  detected.  Sensory (Neurological): Unimpaired         Sensory (Neurological): Unimpaired        DTR: Patellar: deferred today Achilles: deferred today Plantar: deferred today  DTR: Patellar: deferred today Achilles: deferred today Plantar: deferred today  Palpation: No palpable anomalies  Palpation: No palpable anomalies    Assessment   Status Diagnosis  Improved Improved Persistent 1. Lumbar facet arthropathy   2. Lumbar spondylosis   3. Myofascial pain syndrome of lumbar spine   4. Chronic pain syndrome   5. Elbow pain, left       Updated Problems: No problems updated.   Plan of Care    [name] has a current medication list which includes the following long-term medication(s): atorvastatin, clonazepam, duloxetine, fluoxetine, furosemide, metoprolol succinate, montelukast, quetiapine, and gabapentin.  1. Lumbar facet arthropathy - HYDROcodone-acetaminophen (NORCO/VICODIN) 5-325 MG tablet; Take 1-2 tablets by mouth daily as needed for moderate pain.  Dispense: 30 tablet; Refill: 0  2. Lumbar spondylosis - HYDROcodone-acetaminophen (NORCO/VICODIN) 5-325 MG tablet; Take 1-2 tablets by mouth daily as needed for moderate pain.  Dispense: 30 tablet; Refill: 0  3. Myofascial pain syndrome of lumbar spine - TRIGGER POINT INJECTION; Future  4. Chronic pain syndrome - HYDROcodone-acetaminophen (NORCO/VICODIN) 5-325 MG tablet; Take 1-2 tablets by mouth daily as needed for moderate pain.  Dispense: 30 tablet; Refill: 0 - TRIGGER POINT INJECTION; Future - gabapentin (NEURONTIN) 300 MG capsule; Take 2 capsules (600 mg total) by mouth 3 (three) times daily.  Dispense: 180 capsule; Refill: 5  5. Elbow pain, left - gabapentin (NEURONTIN) 300 MG capsule; Take 2 capsules (600 mg total) by mouth 3 (three) times daily.  Dispense: 180 capsule; Refill: 5  Can consider repeating lumbar RFA after 6 months   Pharmacotherapy (Medications Ordered): Meds ordered this encounter  Medications   HYDROcodone-acetaminophen  (NORCO/VICODIN) 5-325 MG tablet    Sig: Take 1-2 tablets by mouth daily as needed for moderate pain.    Dispense:  30 tablet    Refill:  0   gabapentin (NEURONTIN) 300 MG capsule    Sig: Take 2 capsules (600 mg total) by mouth 3 (three) times daily.    Dispense:  180 capsule    Refill:  5    Orders:  Orders Placed This Encounter  Procedures   TRIGGER POINT INJECTION    Standing Status:   Future    Standing Expiration Date:   02/07/2021    Scheduling Instructions:     Lumbar without sedation    Order Specific Question:   Where will this procedure be performed?    Answer:   [facility]    Follow-up plan:   Return in 6 days (on 11/13/2020) for TPI, without sedation.     Status post bilateral facet medial branch nerve blocks L3, L4, L5 on 09/03/2017, helpful for axial low back and buttock pain: Repeat PRN.  Status post right L4-L5 epidural steroid injection on 03/09/2018, 07/29/2018 which was helpful for her right-sided hip and leg pain.  Right L4-L5 ESI #3 on 09/23/2027 not as helpful.  Right L3, L4, L5 RFA on 07/07/2019.  L3, L4, L5 RFA, Left: 09/25/2020.  Right: 09/04/2020         Recent Visits Date Type Provider Dept  09/25/20 Procedure visit [name], MD [facility]  09/04/20 Procedure visit [name], MD [facility]  08/21/20 Office Visit [name], MD [facility]  Showing recent visits within past 90 days  and meeting all other requirements Today's Visits Date Type Provider Dept  11/07/20 Office Visit [name], MD [facility]  Showing today's visits and meeting all other requirements Future Appointments Date Type Provider Dept  11/20/20 Appointment [name], MD [facility]  Showing future appointments within next 90 days and meeting all other requirements I discussed the assessment and treatment plan with the patient. The patient was provided an opportunity to ask questions and all were answered. The  patient agreed with the plan and demonstrated an understanding of the instructions.  Patient advised to call back or seek an in-person evaluation if the symptoms or condition worsens.  Duration of encounter: 41mnutes.  Note by: BGillis Santa MD Date: 11/07/2020; Time: 2:27 PM

## 2020-11-10 ENCOUNTER — Other Ambulatory Visit: Payer: Self-pay | Admitting: Family Medicine

## 2020-11-10 DIAGNOSIS — F332 Major depressive disorder, recurrent severe without psychotic features: Secondary | ICD-10-CM

## 2020-11-10 NOTE — Telephone Encounter (Signed)
Requested medications are due for refill today yes  Requested medications are on the active medication list yes  Last refill 10/12/20  Last visit 04/19/20  Future visit scheduled no except with case manager  Notes to clinic Has already had a curtesy refill and there is no upcoming appointment scheduled.

## 2020-11-13 ENCOUNTER — Ambulatory Visit (INDEPENDENT_AMBULATORY_CARE_PROVIDER_SITE_OTHER): Payer: Medicare Other

## 2020-11-13 ENCOUNTER — Telehealth: Payer: Medicare Other | Admitting: General Practice

## 2020-11-13 DIAGNOSIS — F411 Generalized anxiety disorder: Secondary | ICD-10-CM

## 2020-11-13 DIAGNOSIS — Z9181 History of falling: Secondary | ICD-10-CM

## 2020-11-13 DIAGNOSIS — J449 Chronic obstructive pulmonary disease, unspecified: Secondary | ICD-10-CM

## 2020-11-13 DIAGNOSIS — E785 Hyperlipidemia, unspecified: Secondary | ICD-10-CM

## 2020-11-13 DIAGNOSIS — F332 Major depressive disorder, recurrent severe without psychotic features: Secondary | ICD-10-CM

## 2020-11-13 DIAGNOSIS — M545 Low back pain, unspecified: Secondary | ICD-10-CM

## 2020-11-13 DIAGNOSIS — G8929 Other chronic pain: Secondary | ICD-10-CM

## 2020-11-13 DIAGNOSIS — F339 Major depressive disorder, recurrent, unspecified: Secondary | ICD-10-CM

## 2020-11-13 NOTE — Patient Instructions (Signed)
Visit Information  PATIENT GOALS:  Goals Addressed             This Visit's Progress    RNCM: Cope with Chronic Pain       Timeframe:  Long-Range Goal Priority:  High Start Date:       09-04-2020                     Expected End Date:      09-04-2021                 Follow Up Date 01/15/2021    - learn how to meditate - practice acceptance of chronic pain - practice relaxation or meditation daily - spend time with positive people - tell myself I can (not I can't) - think of new ways to do favorite things - use distraction techniques - use relaxation during pain    Why is this important?   Stress makes chronic pain feel worse.  Feelings like depression, anxiety, stress and anger can make your body more sensitive to pain.  Learning ways to cope with stress or depression may help you find some relief from the pain.     Notes: 09-04-2020: The patient had injections this am in her left lumbar back region. She feels they have been helpful. The patient states that she will go back on the 31st for injections to the right side. Will continue to monitor for changes. 11-13-2020: The patient states she is doing better with her management of pain. The patient is having a procedure on 11-15-2020.  The patient states she is having injections on Wednesday. The patient is hopeful this will help her pain level. She states her pain level is an 8 today. Will continue to monitor.      RNCM: Prevent Falls and Injury       Follow Up Date 01-15-2021   - add more outdoor lighting - always use handrails on the stairs - always wear low-heeled or flat shoes or slippers with nonskid soles - call the doctor if I am feeling too drowsy - install bathroom grab bars - keep a flashlight by the bed - keep my cell phone with me always - learn how to get back up if I fall - make an emergency alert plan in case I fall - pick up clutter from the floors - use a nonslip pad with throw rugs, or remove them  completely - use a nightlight in the bathroom    Why is this important?   Most falls happen when it is hard for you to walk safely. Your balance may be off because of an illness. You may have pain in your knees, hip or other joints.  You may be overly tired or taking medicines that make you sleepy. You may not be able to see or hear clearly.  Falls can lead to broken bones, bruises or other injuries.  There are things you can do to help prevent falling.     Notes: Patient states last fall was last week, today is 04-06-2020. Denies injuries. 09-04-2020: Denies falls since the last outreach. Will continue to monitor. 11-13-2020: Denies any new falls. Will continue to monitor.         The patient verbalized understanding of instructions, educational materials, and care plan provided today and declined offer to receive copy of patient instructions, educational materials, and care plan.   Telephone follow up appointment with care management team member scheduled for: 01-15-2021 at  1 pm  [name] RN, MSN, [place] Mobile: [phone]

## 2020-11-13 NOTE — Chronic Care Management (AMB) (Signed)
Chronic Care Management   CCM RN Visit Note  11/13/2020 Name: Beth [name] MRN: [id number] DOB: 1945/02/23  Subjective: Beth [name] is a 75 y.o. year old female who is a primary care patient of [name], DO. The care management team was consulted for assistance with disease management and care coordination needs.    Engaged with patient by telephone for follow up visit in response to provider referral for case management and/or care coordination services.   Consent to Services:  The patient was given information about Chronic Care Management services, agreed to services, and gave verbal consent prior to initiation of services.  Please see initial visit note for detailed documentation.   Patient agreed to services and verbal consent obtained.   Assessment: Review of patient past medical history, allergies, medications, health status, including review of consultants reports, laboratory and other test data, was performed as part of comprehensive evaluation and provision of chronic care management services.   SDOH (Social Determinants of Health) assessments and interventions performed:    CCM Care Plan  Allergies  Allergen Reactions   Bextra  [Valdecoxib]    Compazine [Prochlorperazine Edisylate]     Stroke-like symptoms   Lithium Carbonate     Leg weakness   Lyrica [Pregabalin]     Outpatient Encounter Medications as of 11/13/2020  Medication Sig   acetaminophen (TYLENOL) 500 MG tablet Take 500 mg by mouth 2 (two) times daily as needed.   albuterol (VENTOLIN HFA) 108 (90 Base) MCG/ACT inhaler Inhale into the lungs every 6 (six) hours as needed for wheezing or shortness of breath.   aspirin 81 MG chewable tablet Chew 81 mg daily by mouth.   atorvastatin (LIPITOR) 40 MG tablet TAKE 1 TABLET BY MOUTH EVERY DAY   clonazePAM (KLONOPIN) 0.5 MG tablet TAKE 1 TABLET(0.5 MG) BY MOUTH EVERY OTHER DAY AS NEEDED FOR ANXIETY   DULoxetine (CYMBALTA) 20 MG capsule TAKE  1 CAPSULE(20 MG) BY MOUTH DAILY   FLUoxetine (PROZAC) 20 MG capsule TAKE 3 CAPSULES(60 MG) BY MOUTH DAILY   furosemide (LASIX) 20 MG tablet Take 1 tablet (20 mg total) by mouth daily.   gabapentin (NEURONTIN) 300 MG capsule Take 2 capsules (600 mg total) by mouth 3 (three) times daily.   HYDROcodone-acetaminophen (NORCO/VICODIN) 5-325 MG tablet Take 1-2 tablets by mouth daily as needed for moderate pain.   hydrOXYzine (ATARAX/VISTARIL) 25 MG tablet Take 25-50 mg by mouth daily as needed.   Melatonin 3 MG CAPS Take 10 mg by mouth at bedtime as needed (sleep).    metoprolol succinate (TOPROL-XL) 25 MG 24 hr tablet TAKE 1/2 TABLET(12.5 MG) BY MOUTH DAILY   montelukast (SINGULAIR) 10 MG tablet TAKE 1 TABLET(10 MG) BY MOUTH AT BEDTIME   multivitamin-iron-minerals-folic acid (CENTRUM) chewable tablet Chew 1 tablet by mouth daily.   QUEtiapine (SEROQUEL) 100 MG tablet TAKE 1 TABLET(100 MG) BY MOUTH AT BEDTIME   TRELEGY ELLIPTA 200-62.5-25 MCG/INH AEPB INHALE 1 PUFF INTO THE LUNGS DAILY   No facility-administered encounter medications on file as of 11/13/2020.    Patient Active Problem List   Diagnosis Date Noted   Rash 01/14/2020   Pulmonary hypertension ([place]) 09/14/2019   Coccydynia 07/29/2019   Cervicalgia 05/04/2019   Acute pain of left shoulder 05/04/2019   Elbow pain, left 05/04/2019   DOE (dyspnea on exertion) 03/23/2019   Leg swelling 03/22/2019    Class: Acute   Lumbar degenerative disc disease 09/10/2018   Left hip pain 09/10/2018   Post-traumatic osteoarthritis of  left elbow 07/14/2018   Lumbar radiculopathy 02/12/2018   Lumbar spondylosis 02/12/2018   Lumbar facet arthropathy 02/12/2018   Atherosclerosis of aorta ([place]) 09/17/2017   Lung nodule 09/17/2017   Urinary tract infection 12/05/2016   Protein-calorie malnutrition, mild (HCC) 08/07/2016   Generalized anxiety disorder 07/15/2016   Chronic pain syndrome 07/15/2016   Severe episode of recurrent major depressive  disorder, without psychotic features ([place]) 07/14/2016   Moderate benzodiazepine use disorder ([place]) 05/08/2016   Major depressive disorder, recurrent, severe w/o psychotic behavior ([place]) 05/01/2016   Seasonal allergic rhinitis 03/23/2015   Hyperglycemia 03/23/2015   Senile purpura ([place]) 03/23/2015   Perennial allergic rhinitis with seasonal variation 03/23/2015   Marital problems 10/31/2014   Migraine without aura and without status migrainosus, not intractable 10/31/2014   Chronic bilateral low back pain without sciatica 08/09/2014   Colon polyp 08/09/2014   COPD, severe ([place]) 08/09/2014   CVA, old, hemiparesis ([place]) 08/09/2014   Dyslipidemia 08/09/2014   Dysfunction of eustachian tube 08/09/2014   Fibromyalgia syndrome 08/09/2014   Gastro-esophageal reflux disease without esophagitis 08/09/2014   Benign migrating glossitis 08/09/2014   Cerebrovascular accident, old 08/09/2014   IBS (irritable bowel syndrome) 08/09/2014   Low back pain with radiation 08/09/2014   Chronic recurrent major depressive disorder ([place]) 79/89/2119   Dysmetabolic syndrome 41/74/0814   OP (osteoporosis) 08/09/2014   Vitamin D deficiency 08/09/2014   Smoking 08/09/2014   Benign hypertension 07/19/2013   Benign neoplasm of skin of trunk 06/03/2013   H/O: pneumonia 09/25/2012    Conditions to be addressed/monitored:HLD, COPD, Anxiety, Depression, and Chronic pain and fall prevention   Care Plan : RNCM: HLD Management  Updates made by Vanita Ingles, RN since 11/13/2020 12:00 AM     Problem: RNCM: HLD Management   Priority: Medium     Long-Range Goal: RNCM: HLD Mangement   Start Date: 04/06/2020  Expected End Date: 08/30/2021  This Visit's Progress: On track  Recent Progress: On track  Priority: Medium  Note:   Current Barriers:  Poorly controlled hyperlipidemia, complicated by COPD, smoker Current antihyperlipidemic regimen: Lipitor 40 mg QD Most recent lipid panel:  Lab Results  Component Value  Date   CHOL 135 04/10/2018   CHOL 131 06/26/2017   CHOL 86 07/15/2016   Lab Results  Component Value Date   HDL 59 04/10/2018   HDL 57 06/26/2017   HDL 33 (A) 07/15/2016   Lab Results  Component Value Date   LDLCALC 64 04/10/2018   [place] 61 06/26/2017   LDLCALC 37 07/15/2016   Lab Results  Component Value Date   TRIG 50 04/10/2018   TRIG 46 06/26/2017   TRIG 78 07/15/2016   Lab Results  Component Value Date   CHOLHDL 2.3 04/10/2018   CHOLHDL 2.3 06/26/2017   CHOLHDL 2.6 03/21/2016   BP Readings from Last 3 Encounters:  11/07/20 (!) 119/59  09/25/20 110/67  09/04/20 117/63    No results found for: LDLDIRECT  ASCVD risk enhancing conditions: age >58,  HTN,  current smoker Lacks Scientist, product/process development Does not maintain contact with provider office Does not contact provider office for questions/concerns RN Care Manager Clinical Goal(s):  patient will work with Consulting civil engineer, providers, and care team towards execution of optimized self-health management plan patient will verbalize understanding of plan for effective management of HLD  patient will work with RNCM and pcp  to address needs related to management of HLD Interventions: Collaboration with [name], DO regarding development and update  of comprehensive plan of care as evidenced by provider attestation and co-signature Inter-disciplinary care team collaboration (see longitudinal plan of care) Medication review performed; medication list updated in electronic medical record 11-13-2020: States that she is compliant with her medications.  Inter-disciplinary care team collaboration (see longitudinal plan of care) Referred to pharmacy team for assistance with HLD medication management Evaluation of current treatment plan related to HLD and patient's adherence to plan as established by provider. 11-13-2020: The patient states she is compliant with medications and plan of care. Denies any new concerns  related to HLD. Blood pressures are stable. Blood pressure today was 129/59. She is also weighing again and her weight was 150. Denies any issues with HTN or HF. Will continue to monitor. No active care plan open for HTN or HF as these chronic conditions are under control at this time.  Advised patient to call the office for changes in condition or questions  Provided education to patient re: heart healthy diet, medication compliance, and working with CCM team to manage health and well being.  Discussed plans with patient for ongoing care management follow up and provided patient with direct contact information for care management team Patient Goals/Self-Care Activities: - call for medicine refill 2 or 3 days before it runs out - call if I am sick and can't take my medicine - keep a list of all the medicines I take; vitamins and herbals too - learn to read medicine labels - use a pillbox to sort medicine - use an alarm clock or phone to remind me to take my medicine - change to whole grain breads, cereal, pasta - drink 6 to 8 glasses of water each day - eat 3 to 5 servings of fruits and vegetables each day - eat 5 or 6 small meals each day - fill half the plate with nonstarchy vegetables - limit fast food meals to no more than 1 per week - manage portion size - prepare main meal at home 3 to 5 days each week - read food labels for fat, fiber, carbohydrates and portion size - be open to making changes - I can manage, know and watch for signs of a heart attack - if I have chest pain, call for help - learn about small changes that will make a big difference - learn my personal risk factors - barriers to meeting goals identified - change-talk evoked - choices provided - collaboration with team encouraged - decision-making supported - health risks reviewed - problem-solving facilitated - questions answered - readiness for change evaluated - reassurance provided - resources needed to  meet goals identified - self-reflection promoted - self-reliance encouraged - verbalization of feelings encouraged  Follow Up Plan: Telephone follow up appointment with care management team member scheduled for:01-15-2021 at 1 pm      Care Plan : RNCM: Depression (Adult) and Anxiety  Updates made by Vanita Ingles, RN since 11/13/2020 12:00 AM     Problem: RNCM: Depression Identification (Depression) and anxiety   Priority: Medium     Long-Range Goal: RNCM: Depressive Symptoms Identified and Anxiety   Start Date: 04/06/2020  Expected End Date: 07/01/2021  This Visit's Progress: On track  Recent Progress: On track  Priority: Medium  Note:   Current Barriers:  Knowledge Deficits related to resources to help with finding a new psychiatrist in the are to help with effective management of depression and anxiety  Chronic Disease Management support and education needs related to effective management of depression and  anxiety  Lacks caregiver support.  Difficulty obtaining medications Unable to independently manage depression and anxiety as evidence of the need for a new psychiatrist in [place] to self administer medications as prescribed Lacks social connections Does not maintain contact with provider office Does not contact provider office for questions/concerns Unable to secure a new psychiatrist- 11-13-2020: Still trying to acquire a new psychiatrist. Working with LCSW to assist with securing a new psychiatrist.   Nurse Case Manager Clinical Goal(s):  patient will verbalize understanding of plan for effective management of depression and anxiety  patient will work with RNCM, pcp, and CCM team  to address needs related to depression ad anxiety  patient will demonstrate improved adherence to prescribed treatment plan for depression and anxiety  as evidenced byno exacerbations in depression/anxiety or changes in mood  Interventions:  1:1 collaboration with [name], DO regarding development and update of comprehensive plan of care as evidenced by provider attestation and co-signature Inter-disciplinary care team collaboration (see longitudinal plan of care) Evaluation of current treatment plan related to depression and anxiety  and patient's adherence to plan as established by provider. 06-08-2020: Saw the pcp in March. Medications refilled at that time. The patient states that she has not found a new psychiatrist yet. She has called her insurance and left messages, but has not received returned calls. She states she will need refills soon. Education on contacting the providers on the list she was provided by her insurance company. Also advised the patient to get a follow up visit with pcp for recommendations and evaluation. 09-04-2020: The patient still is without a psychiatrist. LCSW called [facility] and they will not accept the patient back. The LCSW has notified [facility]. The patient has not heard from them yet. The referral was made last week so hopefully the patient will get in to the [facility] program for medications management. The RNCM will continue to monitor. The patient had a procedure today and was tired so she did not want to talk long. The patient feels she is stable with her depression and anxiety at this time. Will continue to monitor. Collaboration with LCSW today by phone to update the LCSW accordingly. Reminded the patient of upcoming appointments with the LCSW and the pharm D. 11-13-2020: The patient states she is still without a psychiatrist. Discussed that she really does need a new one but she has been unsuccessful. Has left a message with the LCSW but has not heard back from the LCSW. RNCM will collaborate with the LCSW about outreach today and ask for recommendations. The patient states she feels like she is doing well. Denies any acute distress today.  Advised patient to work with CCM team to meet depression and anxiety needs. Has  talked with LCSW recently. Discussed calling the office for changes in mood, anxiety, or depression. 11-13-2020: Reviewed with the patient and will  continue to collaborate with the CCM team and pcp on best course of action to assist with finding needed psychiatric support for the patient.  The patient denies any new concerns with her depression or anxiety but would like to find a new psychiatrist to manage her care. She states most doctors do not want to treat patients over the age of 66. Provided education to patient re: effective ways to manage depression and anxiety  Reviewed medications with patient and discussed compliance. States she is in need of some refills Collaborated with RNCM, pcp, and CCM team  regarding patient saying  she is out of clonazepam and montelukast and her psychiatrist in [place] says she is too far and will not see her or refill medications. The patient is receptive to working with CCM team to get a new psychiatrist. 11-13-2020: Is currently still looking for psychiatrist.  Social Work referral for help with new psychiatrist. 11-13-2020: The patient is actively working with the [facility] referral for medication management. 11-13-2020: The patient is actively working with the pharm D Discussed plans with patient for ongoing care management follow up and provided patient with direct contact information for care management team  Patient Goals/Self-Care Activities  patient will:  - Patient will self administer medications as prescribed Patient will attend all scheduled provider appointments Patient will call pharmacy for medication refills Patient will attend church or other social activities Patient will continue to perform ADL's independently Patient will continue to perform IADL's independently Patient will call provider office for new concerns or questions Patient will work with BSW to address care coordination needs and will continue to work with the clinical team to  address health care and disease management related needs.    - anxiety screen reviewed - depression screen reviewed - medication list reviewed - participation in psychiatric services encouraged Follow Up Plan: Telephone follow up appointment with care management team member scheduled for: 01-15-2021 at 1 pm        Care Plan : RNCM: COPD (Adult)  Updates made by Vanita Ingles, RN since 11/13/2020 12:00 AM     Problem: RNCM; Psychological Adjustment to Diagnosis (COPD)   Priority: Medium     Long-Range Goal: RNCM:  Adjustment to Disease Achieved   Start Date: 04/06/2020  Expected End Date: 08/30/2021  This Visit's Progress: On track  Recent Progress: On track  Priority: Medium  Note:   Current Barriers:  Knowledge deficits related to basic understanding of COPD disease process Knowledge deficits related to basic COPD self care/management Knowledge deficit related to basic understanding of how to use inhalers and how inhaled medications work Knowledge deficit related to importance of energy conservation Lacks social connections Does not maintain contact with provider office Does not contact provider office for questions/concerns  Case Manager Clinical Goal(s): patient will report using inhalers as prescribed including rinsing mouth after use patient will report utilizing pursed lip breathing for shortness of breath patient will verbalize understanding of COPD action plan and when to seek appropriate levels of medical care patient will engage in lite exercise as tolerated to build/regain stamina and strength and reduce shortness of breath through activity tolerance patient will verbalize basic understanding of COPD disease process and self care activities patient will not be hospitalized for COPD exacerbation as evidenced  Interventions:  Collaboration with [place], [name], DO regarding development and update of comprehensive plan of care as evidenced by provider  attestation and co-signature Inter-disciplinary care team collaboration (see longitudinal plan of care) Provided patient with basic written and verbal COPD education on self care/management/and exacerbation prevention  Provided patient with COPD action plan and reinforced importance of daily self assessment. 11-13-2020: The patient feels her COPD is effectively managed at this time. The patient states that she is not having any issues currently with her breathing. States that she is using supplemental oxygen at night. States that she feels like it is helping her. She feels rested in the mornings when she wakes up.  Provided written and verbal instructions on pursed lip breathing and utilized returned demonstration as teach back Provided instruction about proper use  of medications used for management of COPD including inhalers Advised patient to self assesses COPD action plan zone and make appointment with provider if in the yellow zone for 48 hours without improvement. Provided patient with education about the role of exercise in the management of COPD Advised patient to engage in light exercise as tolerated 3-5 days a week Provided education about and advised patient to utilize infection prevention strategies to reduce risk of respiratory infection  06-08-2020: Review of COPD status. The patient has been taking ABX but for a broken tooth that will be removed on 11-13-2020. She denies any issues with her COPD at this time. Review of higher risk of infection. Denies any acute findings. Will continue to monitor.  Patient Goals/Self-Care Activities:  - counseling provided - decision-making supported - depression screen reviewed - emotional support provided - family involvement promoted - problem-solving facilitated - relaxation techniques promoted - verbalization of feelings encouraged Follow Up Plan: Telephone follow up appointment with care management team member scheduled for:01-15-2021 at 1 pm     Care Plan : RNCM: Fall Risk (Adult)  Updates made by Vanita Ingles, RN since 11/13/2020 12:00 AM     Problem: RNCM: Fall Risk   Priority: High     Long-Range Goal: RNCM: Absence of Fall and Fall-Related Injury   Start Date: 04/06/2020  Expected End Date: 07/01/2021  This Visit's Progress: On track  Recent Progress: On track  Priority: High  Note:   Current Barriers:  Knowledge Deficits related to fall precautions in patient with multiple chronic conditions.  Decreased adherence to prescribed treatment for fall prevention Unable to independently manage falls  Lacks social connections Does not contact provider office for questions/concerns Knowledge Deficits related to resources to help with making home safe  Chronic Disease Management support and education needs related to falls prevention and safety  Lacks caregiver support.  Clinical Goal(s):  patient will demonstrate improved adherence to prescribed treatment plan for decreasing falls as evidenced by patient reporting and review of EMR patient will verbalize using fall risk reduction strategies discussed patient will not experience additional falls patient will verbalize understanding of plan for remaining safe in home environment and fall prevention plan. Interventions:  Collaboration with [name], DO regarding development and update of comprehensive plan of care as evidenced by provider attestation and co-signature Inter-disciplinary care team collaboration (see longitudinal plan of care) Provided written and verbal education re: Potential causes of falls and Fall prevention strategies Reviewed medications and discussed potential side effects of medications such as dizziness and frequent urination Assessed for s/s of orthostatic hypotension. 11-13-2020:Review and education today  Assessed for falls since last encounter. Patient had a fall last week, today is 04-06-2020. 11-13-2020: Denies any new falls at this  time. Will continue to monitor.  Assessed patients knowledge of fall risk prevention secondary to previously provided education. Assessed working status of life alert bracelet and patient adherence Provided patient information for fall alert systems Evaluation of current treatment plan related to falls prevention and safety  and patient's adherence to plan as established by provider. 11-13-2020: Denies any new falls at this time. Will continue to monitor.  Advised patient to call the office for new falls or injuries  Provided education to patient re: changing position slowly, reporting increased light headedness or dizziness, and being safe in home environment  Discussed plans with patient for ongoing care management follow up and provided patient with direct contact information for care management team Self-Care Deficits:  Unable  to independently prevent falls in her home environment  Lacks social connections Does not maintain contact with provider office Does not contact provider office for questions/concerns Patient Goals:  [place] safety precautions appropriately with all ambulation. Does not use a cane or a walker  - De-clutter walkways - Change positions slowly - Wear secure fitting shoes at all times with ambulation - Utilize home lighting for dim lit areas - Demonstrate self and pet awareness at all times - activities of daily living skills assessed - barriers to physical activity or exercise addressed - barriers to physical activity or exercise identified - barriers to safety identified - cognition assessed - cognitive-stimulating activities promoted - fall prevention plan reviewed and updated - fear of falling, loss of independence and pain acknowledged - medication list reviewed - modification of home and work environment promoted - vision and/or hearing aid use promoted Follow Up Plan: Telephone follow up appointment with care management team member scheduled  for:01-15-2021 at 1 pm    Care Plan : Chronic Pain (Adult)  Updates made by Vanita Ingles, RN since 11/13/2020 12:00 AM     Problem: Chronic Pain Management (Chronic Pain)      Long-Range Goal: Chronic Pain Managed   Start Date: 11/13/2020  Expected End Date: 11/13/2021  This Visit's Progress: On track  Priority: High  Note:   Current Barriers:  Knowledge Deficits related to managing acute/chronic pain Non-adherence to scheduled provider appointments Non-adherence to prescribed medication regimen Difficulty obtaining medications Chronic Disease Management support and education needs related to chronic pain Unable to independently manage chronic back pain and discomfort Nurse Case Manager Clinical Goal(s):  patient will verbalize understanding of plan for managing pain patient will work with [place] to address new concerns related to pain and discomfort and changes in pain patient will attend all scheduled medical appointments: has an appointment on 11-15-2020 for injections in her back.  patient will demonstrate use of different relaxation  skills and/or diversional activities to assist with pain reduction (distraction, imagery, relaxation, massage, acupressure, TENS, heat, and cold application patient will report pain at a level less than 3 to 4 on a 10-10 rating scale patient will use pharmacological and nonpharmacological pain relief strategies patient will verbalize acceptable level of pain relief and ability to engage in desired activities patient will engage in desired activities without an increase in pain level Interventions:  Collaboration with [name], DO regarding development and update of comprehensive plan of care as evidenced by provider attestation and co-signature Inter-disciplinary care team collaboration (see longitudinal plan of care) - deep breathing, relaxation and mindfulness use promoted - effectiveness of pharmacologic therapy  monitored - medication-induced side effects managed - misuse of pain medication assessed - motivation and barriers to change assessed and addressed - mutually acceptable comfort goal set - pain assessed - pain treatment goals reviewed - participation in support group encouraged - premedication prior to activity encouraged Evaluation of current treatment plan related to chronic back pain and patient's adherence to plan as established by provider. Advised patient to call the office for changes in level or intensity of pain or unresolved pain Provided education to patient re: following the plan of care, taking medications, as directed and working with the CCM team, pcp, and specialist to optimize plan of care for effective pain management.  Reviewed medications with patient and discussed compliance. 11-13-2020: The patient is compliant with medications  Discussed plans with patient for ongoing care management follow up and provided patient  with direct contact information for care management team Allow patient to maintain a diary of pain ratings, timing, precipitating events, medications, treatments, and what works best to relieve pain,  Refer to support groups and self-help groups Educate patient about the use of pharmacological interventions for pain management- antianxiety, antidepressants, NSAIDS, opioid analgesics,  Explain the importance of lifestyle modifications to effective pain management  Patient Goals/Self Care Activities:   Self-administers medications as prescribed Attends all scheduled provider appointments Calls pharmacy for medication refills Calls provider office for new concerns or questions Follow Up Plan: Telephone follow up appointment with care management team member scheduled for: 01-15-2021 at 1 pm      Task: RNCM; Alleviate Barriers to Chronic Pain Management Completed 11/13/2020  Outcome: Positive  Note:   Care Management Activities:    - deep breathing,  relaxation and mindfulness use promoted - effectiveness of pharmacologic therapy monitored - medication-induced side effects managed - misuse of pain medication assessed - motivation and barriers to change assessed and addressed - mutually acceptable comfort goal set - pain assessed - pain treatment goals reviewed - premedication prior to activity encouraged    Notes:      Plan:Telephone follow up appointment with care management team member scheduled for:  01-15-2021 at 1 pm  [place], MSN, [place] Mobile: [phone]

## 2020-11-14 ENCOUNTER — Encounter: Payer: Self-pay | Admitting: Family Medicine

## 2020-11-14 ENCOUNTER — Other Ambulatory Visit: Payer: Self-pay

## 2020-11-14 ENCOUNTER — Ambulatory Visit (INDEPENDENT_AMBULATORY_CARE_PROVIDER_SITE_OTHER): Payer: Medicare Other | Admitting: Family Medicine

## 2020-11-14 VITALS — BP 138/56 | HR 66 | Ht 65.0 in | Wt 152.0 lb

## 2020-11-14 DIAGNOSIS — F411 Generalized anxiety disorder: Secondary | ICD-10-CM

## 2020-11-14 DIAGNOSIS — F5104 Psychophysiologic insomnia: Secondary | ICD-10-CM

## 2020-11-14 DIAGNOSIS — Z23 Encounter for immunization: Secondary | ICD-10-CM

## 2020-11-14 DIAGNOSIS — I5032 Chronic diastolic (congestive) heart failure: Secondary | ICD-10-CM | POA: Diagnosis not present

## 2020-11-14 DIAGNOSIS — F332 Major depressive disorder, recurrent severe without psychotic features: Secondary | ICD-10-CM

## 2020-11-14 DIAGNOSIS — J449 Chronic obstructive pulmonary disease, unspecified: Secondary | ICD-10-CM

## 2020-11-14 DIAGNOSIS — I272 Pulmonary hypertension, unspecified: Secondary | ICD-10-CM | POA: Diagnosis not present

## 2020-11-14 MED ORDER — CLONAZEPAM 0.5 MG PO TABS: ORAL_TABLET | ORAL | 2 refills | Status: DC

## 2020-11-14 MED ORDER — QUETIAPINE FUMARATE 100 MG PO TABS: 100.0000 mg | ORAL_TABLET | Freq: Every day | ORAL | 3 refills | Status: DC

## 2020-11-14 MED ORDER — DULOXETINE HCL 20 MG PO CPEP: 20.0000 mg | ORAL_CAPSULE | Freq: Every day | ORAL | 3 refills | Status: DC

## 2020-11-14 MED ORDER — FLUOXETINE HCL 20 MG PO CAPS: 60.0000 mg | ORAL_CAPSULE | Freq: Every day | ORAL | 3 refills | Status: DC

## 2020-11-14 NOTE — Patient Instructions (Addendum)
S[employer] in 2023 covered by [facility]) [facility] at [address], [place] [address] Main: [phone]   Stay tuned for apt from [place] ordered today   Please schedule a Follow-up Appointment to: Return in about 6 months (around 05/15/2021) for 6 month follow-up depression / insomnia, med adjust psych update.  If you have any other questions or concerns, please feel free to call the office or send a message through [place]. You may also schedule an earlier appointment if necessary.  Additionally, you may be receiving a survey about your experience at our office within a few days to 1 week by e-mail or mail. We value your feedback.  Nobie Putnam, DO [place]

## 2020-11-14 NOTE — Progress Notes (Signed)
Subjective:    Patient ID: Beth [name], female    DOB: 04-08-1945, 75 y.o.   MRN: [id number]  Beth [name] is a 75 y.o. female presenting on 11/14/2020 for Depression   HPI  Major Depression recurrent severe Generalized Anxiety Disorder Insomnia Chronic problems.  Working with Christa See LCSW CCM to help locate Psychiatry Previously with Psychiatry in [place] to find psych that accepts her insurance age 26+  She has been on these medications for period of time. Clonazepam 0.5mg  every other day PRN, usually takes about 3 per week or less. Seroquel/Quetiapine 100mg  nightly for inosomnia Fluoxetine 20mg  x 3 = 60mg  daily for mood Duloxetine 20mg  daily for mood and pain Needs refills until she can get in with psych  Diastolic CHF Pulm Hypertension Centrilobular Emphyse, Severe COPD Followed by Dr Jerrye Noble Pulm, on Oxygen supplemental at night. Recent ECHO done.   Depression screen [facility] 2/9 11/14/2020 09/25/2020 09/19/2020  Decreased Interest 1 0 0  Down, Depressed, Hopeless 1 0 0  PHQ - 2 Score 2 0 0  Altered sleeping 0 - -  Tired, decreased energy 2 - -  Change in appetite 1 - -  Feeling bad or failure about yourself  2 - -  Trouble concentrating 2 - -  Moving slowly or fidgety/restless 1 - -  Suicidal thoughts 0 - -  PHQ-9 Score 10 - -  Difficult doing work/chores Somewhat difficult - -  Some recent data might be hidden    Social History   Tobacco Use   Smoking status: Former    Packs/day: 0.25    Years: 57.00    Pack years: 14.25    Types: Cigarettes    Quit date: 10/24/2020    Years since quitting: 0.0   Smokeless tobacco: Never   Tobacco comments:    6-8cig daily--08/28/2020  Vaping Use   Vaping Use: Never used  Substance Use Topics   Alcohol use: No    Alcohol/week: 0.0 standard drinks   Drug use: No    Review of Systems Per HPI unless specifically indicated above     Objective:    BP (!) 138/56   Pulse 66   Ht 5'  5" (1.651 m)   Wt 152 lb (68.9 kg)   SpO2 96%   BMI 25.29 kg/m   Wt Readings from Last 3 Encounters:  11/14/20 152 lb (68.9 kg)  11/07/20 150 lb (68 kg)  09/25/20 158 lb (71.7 kg)    Physical Exam Vitals and nursing note reviewed.  Constitutional:      General: She is not in acute distress.    Appearance: Normal appearance. She is well-developed. She is not diaphoretic.     Comments: Well-appearing, comfortable, cooperative  HENT:     Head: Normocephalic and atraumatic.  Eyes:     General:        Right eye: No discharge.        Left eye: No discharge.     Conjunctiva/sclera: Conjunctivae normal.  Cardiovascular:     Rate and Rhythm: Normal rate.  Pulmonary:     Effort: Pulmonary effort is normal.  Skin:    General: Skin is warm and dry.     Findings: No erythema or rash.  Neurological:     Mental Status: She is alert and oriented to person, place, and time.  Psychiatric:        Mood and Affect: Mood normal.        Behavior: Behavior normal.  Thought Content: Thought content normal.     Comments: Well groomed, good eye contact, normal speech and thoughts      Results for orders placed or performed in visit on 10/11/20  ECHOCARDIOGRAM COMPLETE  Result Value Ref Range   AR max vel 2.04 cm2   AV Peak grad 6.0 mmHg   Ao pk vel 1.22 m/s   S' Lateral 3.50 cm   Area-P 1/2 4.68 cm2   AV Area VTI 2.07 cm2   AV Mean grad 4.0 mmHg   Single Plane A4C EF 61.0 %   Single Plane A2C EF 54.6 %   Calc EF 57.7 %   AV Area mean vel 1.88 cm2      Assessment & Plan:   Problem List Items Addressed This Visit     Pulmonary hypertension ([place])   Relevant Orders   Ambulatory referral to Cardiology   Major depressive disorder, recurrent, severe w/o psychotic behavior (HCC) - Primary   Relevant Medications   DULoxetine (CYMBALTA) 20 MG capsule   FLUoxetine (PROZAC) 20 MG capsule   QUEtiapine (SEROQUEL) 100 MG tablet   Generalized anxiety disorder   Relevant Medications    DULoxetine (CYMBALTA) 20 MG capsule   FLUoxetine (PROZAC) 20 MG capsule   clonazePAM (KLONOPIN) 0.5 MG tablet   COPD, severe (HCC)   Chronic diastolic congestive heart failure (HCC)   Relevant Orders   Ambulatory referral to Cardiology   Other Visit Diagnoses     Needs flu shot       Relevant Orders   Flu Vaccine QUAD High Dose(Fluad)   Psychophysiological insomnia       Relevant Medications   QUEtiapine (SEROQUEL) 100 MG tablet   clonazePAM (KLONOPIN) 0.5 MG tablet       Chronic mental health problems Recurrent depression / GAD / insomnia Mostly controlled on medications now, and awaiting new psychiatry she has been unsuccessful in locating new Psych and has worked with our CCM Team including LCSW. Issue with geriatric psych age 94+ accepting insurance  DIscussion on her current medications and she is benefiting from therapy. Agree to manage them for now until we can locate Psychiatry still.  Re order Duloxetine + Fluoxetine combination. Review PDMP, renew Clonazepam Renew Quetiapine  She is aware that based on her medication regimen, it is recommended to have Psychiatry involved in managing these meds.  Diastolic CHF Pulm HTN Identified by Pulm on work up and ECHO Now on Oxygen, has severe COPD Referral today to Cardiology for consult    Orders Placed This Encounter  Procedures   Flu Vaccine QUAD High Dose(Fluad)   Ambulatory referral to Cardiology    Referral Priority:   Routine    Referral Type:   Consultation    Referral Reason:   Specialty Services Required    Requested Specialty:   Cardiology    Number of Visits Requested:   1     Meds ordered this encounter  Medications   DULoxetine (CYMBALTA) 20 MG capsule    Sig: Take 1 capsule (20 mg total) by mouth daily.    Dispense:  90 capsule    Refill:  3   FLUoxetine (PROZAC) 20 MG capsule    Sig: Take 3 capsules (60 mg total) by mouth daily.    Dispense:  270 capsule    Refill:  3   QUEtiapine  (SEROQUEL) 100 MG tablet    Sig: Take 1 tablet (100 mg total) by mouth at bedtime.    Dispense:  90  tablet    Refill:  3   clonazePAM (KLONOPIN) 0.5 MG tablet    Sig: Take 1 tablet (0.5mg ) every other day as needed for anxiety.    Dispense:  15 tablet    Refill:  2    Add extra refills if not ready for fill       Follow up plan: Return in about 6 months (around 05/15/2021) for 6 month follow-up depression / insomnia, med adjust psych update.  Nobie Putnam, [facility] 11/14/2020, 2:39 PM

## 2020-11-15 ENCOUNTER — Encounter: Payer: Self-pay | Admitting: Student in an Organized Health Care Education/Training Program

## 2020-11-15 ENCOUNTER — Ambulatory Visit: Payer: Self-pay | Admitting: Licensed Clinical Social Worker

## 2020-11-15 ENCOUNTER — Ambulatory Visit
Payer: Medicare Other | Attending: Student in an Organized Health Care Education/Training Program | Admitting: Student in an Organized Health Care Education/Training Program

## 2020-11-15 VITALS — BP 118/82 | HR 60 | Temp 96.4°F | Resp 15 | Ht 65.0 in | Wt 150.0 lb

## 2020-11-15 DIAGNOSIS — M7918 Myalgia, other site: Secondary | ICD-10-CM

## 2020-11-15 DIAGNOSIS — F411 Generalized anxiety disorder: Secondary | ICD-10-CM

## 2020-11-15 DIAGNOSIS — G894 Chronic pain syndrome: Secondary | ICD-10-CM | POA: Insufficient documentation

## 2020-11-15 DIAGNOSIS — F332 Major depressive disorder, recurrent severe without psychotic features: Secondary | ICD-10-CM

## 2020-11-15 MED ORDER — ROPIVACAINE HCL 2 MG/ML IJ SOLN
9.0000 mL | Freq: Once | INTRAMUSCULAR | Status: AC
  Administered 2020-11-15: 20 mL via PERINEURAL

## 2020-11-15 MED ORDER — ROPIVACAINE HCL 2 MG/ML IJ SOLN
INTRAMUSCULAR | Status: AC
  Filled 2020-11-15: qty 20

## 2020-11-15 NOTE — Progress Notes (Signed)
Safety precautions to be maintained throughout the outpatient stay will include: orient to surroundings, keep bed in low position, maintain call bell within reach at all times, provide assistance with transfer out of bed and ambulation.  

## 2020-11-15 NOTE — Progress Notes (Signed)
PROVIDER NOTE: Information contained herein reflects review and annotations entered in association with encounter. Interpretation of such information and data should be left to medically-trained personnel. Information provided to patient can be located elsewhere in the medical record under "Patient Instructions". Document created using STT-dictation technology, any transcriptional errors that may result from process are unintentional.    Patient: Beth [name]  Service Category: Procedure  Provider: [name], MD  DOB: [birth year]  DOS: 11/15/2020  Location: [facility]  MRN: [id number]  Setting: Ambulatory - outpatient  Referring Provider: Nobie Putnam *  Type: Established Patient  Specialty: Interventional Pain Management  PCP: [name], DO   Primary Reason for Visit: Interventional Pain Management Treatment. CC: Back Pain (lower)    Procedure:          Anesthesia, Analgesia, Anxiolysis:  Lumbar paraspinal trigger point injection of the erector spinae, rhomboids, multifidus  Type: Local Anesthesia Local Anesthetic: Lidocaine 1-2% Sedation: None  Indication(s):  Analgesia Route: Infiltration (/IM) IV Access: N/A   Position: Prone   Indications: 1. Myofascial pain syndrome of lumbar spine   2. Chronic pain syndrome    Pain Score: Pre-procedure: 6 /10 Post-procedure: 6 /10     Pre-op H&P Assessment:  Beth [name] is a 75 y.o. (year old), female patient, seen today for interventional treatment. She  has a past surgical history that includes Breast surgery (2011); Cervical discectomy; Appendectomy; Abdominal hysterectomy; Cholecystectomy; Sinusotomy; and Breast excisional biopsy (2011). Beth [name] has a current medication list which includes the following prescription(s): acetaminophen, albuterol, aspirin, atorvastatin, clonazepam, duloxetine, fluoxetine, furosemide, gabapentin, hydrocodone-acetaminophen, hydroxyzine, melatonin,  metoprolol succinate, montelukast, multivitamin-iron-minerals-folic acid, quetiapine, and trelegy ellipta. Her primarily concern today is the Back Pain (lower)  Initial Vital Signs:  Pulse/HCG Rate:  (!) 58   Temp: (!) 96.4 F (35.8 C) Resp: 15 BP: (!) 131/59 SpO2: 97 %  BMI: Estimated body mass index is 24.96 kg/m as calculated from the following:   Height as of this encounter: 5\' 5"  (1.651 m).   Weight as of this encounter: 150 lb (68 kg).  Risk Assessment: Allergies: Reviewed. She is allergic to bextra  [valdecoxib], compazine [prochlorperazine edisylate], lithium carbonate, and lyrica [pregabalin].  Allergy Precautions: None required Coagulopathies: Reviewed. None identified.  Blood-thinner therapy: None at this time Active Infection(s): Reviewed. None identified. Beth [name] is afebrile  Site Confirmation: Beth [name] was asked to confirm the procedure and laterality before marking the site Procedure checklist: Completed Consent: Before the procedure and under the influence of no sedative(s), amnesic(s), or anxiolytics, the patient was informed of the treatment options, risks and possible complications. To fulfill our ethical and legal obligations, as recommended by the American Medical Association's Code of Ethics, I have informed the patient of my clinical impression; the nature and purpose of the treatment or procedure; the risks, benefits, and possible complications of the intervention; the alternatives, including doing nothing; the risk(s) and benefit(s) of the alternative treatment(s) or procedure(s); and the risk(s) and benefit(s) of doing nothing. The patient was provided information about the general risks and possible complications associated with the procedure. These may include, but are not limited to: failure to achieve desired goals, infection, bleeding, organ or nerve damage, allergic reactions, paralysis, and death. In addition, the patient was informed of those  risks and complications associated to the procedure, such as failure to decrease pain; infection; bleeding; organ or nerve damage with subsequent damage to sensory, motor, and/or autonomic systems, resulting in permanent pain, numbness, and/or weakness  of one or several areas of the body; allergic reactions; (i.e.: anaphylactic reaction); and/or death. Furthermore, the patient was informed of those risks and complications associated with the medications. These include, but are not limited to: allergic reactions (i.e.: anaphylactic or anaphylactoid reaction(s)); adrenal axis suppression; blood sugar elevation that in diabetics may result in ketoacidosis or comma; water retention that in patients with history of congestive heart failure may result in shortness of breath, pulmonary edema, and decompensation with resultant heart failure; weight gain; swelling or edema; medication-induced neural toxicity; particulate matter embolism and blood vessel occlusion with resultant organ, and/or nervous system infarction; and/or aseptic necrosis of one or more joints. Finally, the patient was informed that Medicine is not an exact science; therefore, there is also the possibility of unforeseen or unpredictable risks and/or possible complications that may result in a catastrophic outcome. The patient indicated having understood very clearly. We have given the patient no guarantees and we have made no promises. Enough time was given to the patient to ask questions, all of which were answered to the patient's satisfaction. Beth [name] has indicated that she wanted to continue with the procedure. Attestation: I, the ordering provider, attest that I have discussed with the patient the benefits, risks, side-effects, alternatives, likelihood of achieving goals, and potential problems during recovery for the procedure that I have provided informed consent. Date  Time: 11/15/2020 11:17 AM  Pre-Procedure Preparation:   Monitoring: As per clinic protocol. Respiration, ETCO2, SpO2, BP, heart rate and rhythm monitor placed and checked for adequate function Safety Precautions: Patient was assessed for positional comfort and pressure points before starting the procedure. Time-out: I initiated and conducted the "Time-out" before starting the procedure, as per protocol. The patient was asked to participate by confirming the accuracy of the "Time Out" information. Verification of the correct person, site, and procedure were performed and confirmed by me, the nursing staff, and the patient. "Time-out" conducted as per Joint Commission's Universal Protocol (UP.01.01.01). Time: 1145  Description of Procedure:          Area Prepped: Entire       Lumbosacral Region DuraPrep (Iodine Povacrylex [0.7% available iodine] and Isopropyl Alcohol, 74% w/w) Safety Precautions: Aspiration looking for blood return was conducted prior to all injections. At no point did we inject any substances, as a needle was being advanced. No attempts were made at seeking any paresthesias. Safe injection practices and needle disposal techniques used. Medications properly checked for expiration dates. SDV (single dose vial) medications used. Description of the Procedure: Protocol guidelines were followed. The patient was placed in position over the fluoroscopy table. The target area was identified and the area prepped in the usual manner. Skin & deeper tissues infiltrated with local anesthetic. Appropriate amount of time allowed to pass for local anesthetics to take effect. The procedure needles were then advanced to the target area. Proper needle placement secured. Negative aspiration confirmed. Solution injected in intermittent fashion, asking for systemic symptoms every 0.5cc of injectate. The needles were then removed and the area cleansed, making sure to leave some of the prepping solution back to take advantage of its long term bactericidal  properties.  Vitals:   11/15/20 1122 11/15/20 1127  BP: (!) 131/59 118/82  Pulse: (!) 58 60  Resp: 15 15  Temp: (!) 96.4 F (35.8 C)   TempSrc: Temporal   SpO2: 97% 95%  Weight: 150 lb (68 kg)   Height: 5\' 5"  (1.651 m)     Start Time: 1145 hrs. End  Time: 1148 hrs. Materials:  Needle(s) Type: Regular needle Gauge: 20G Length: 1.5-in Medication(s): Please see orders for medications and dosing details. Approximately 15 trigger points were injected with 0.5 to 1 cc of 0.2% ropivacaine with dry needling performed.   Post-operative Assessment:  Post-procedure Vital Signs:  Pulse/HCG Rate: 60  Temp:  (!) 96.4 F (35.8 C) Resp: 15 BP: 118/82 SpO2: 95 %  EBL: None  Complications: No immediate post-treatment complications observed by team, or reported by patient.  Note: The patient tolerated the entire procedure well. A repeat set of vitals were taken after the procedure and the patient was kept under observation following institutional policy, for this type of procedure. Post-procedural neurological assessment was performed, showing return to baseline, prior to discharge. The patient was provided with post-procedure discharge instructions, including a section on how to identify potential problems. Should any problems arise concerning this procedure, the patient was given instructions to immediately contact us, at any time, without hesitation. In any case, we plan to contact the patient by telephone for a follow-up status report regarding this interventional procedure.  Comments:  No additional relevant information.  Plan of Care   Medications ordered for procedure: Meds ordered this encounter  Medications   ropivacaine (PF) 2 mg/mL (0.2%) (NAROPIN) injection 9 mL   Medications administered: We administered ropivacaine (PF) 2 mg/mL (0.2%).  See the medical record for exact dosing, route, and time of administration.  Follow-up plan:   Return in about 5 weeks (around  12/20/2020) for Post Procedure Evaluation, virtual.       Status post bilateral facet medial branch nerve blocks L3, L4, L5 on 09/03/2017, helpful for axial low back and buttock pain: Repeat PRN.  Status post right L4-L5 epidural steroid injection on 03/09/2018, 07/29/2018 which was helpful for her right-sided hip and leg pain.  Right L4-L5 ESI #3 on 09/23/2027 not as helpful.  Right L3, L4, L5 RFA on 07/07/2019.  L3, L4, L5 RFA, Left: 09/25/2020.  Right: 09/04/2020          Recent Visits Date Type Provider Dept  11/07/20 Office Visit [name], MD [facility]  09/25/20 Procedure visit [name], MD [facility]  09/04/20 Procedure visit [name], MD [facility]  08/21/20 Office Visit [name], MD [facility]  Showing recent visits within past 90 days and meeting all other requirements Today's Visits Date Type Provider Dept  11/15/20 Procedure visit [name], MD [facility]  Showing today's visits and meeting all other requirements Future Appointments Date Type Provider Dept  11/20/20 Appointment [name], MD [facility]  12/27/20 Appointment [name], MD [facility]  Showing future appointments within next 90 days and meeting all other requirements Disposition: Discharge home  Discharge (Date  Time): 11/15/2020; 1154 hrs.   Primary Care Physician: [name], DO Location: [facility] Note by: [name], MD Date: 11/15/2020; Time: 12:06 PM  Disclaimer:  Medicine is not an exact science. The only guarantee in medicine is that nothing is guaranteed. It is important to note that the decision to proceed with this intervention was based on the information collected from the patient. The Data and conclusions were drawn from the patient's questionnaire, the interview, and the physical examination. Because the information was provided in large part by the  patient, it cannot be guaranteed that it has not been purposely or unconsciously manipulated. Every effort has been made to obtain as much relevant data  as possible for this evaluation. It is important to note that the conclusions that lead to this procedure are derived in large part from the available data. Always take into account that the treatment will also be dependent on availability of resources and existing treatment guidelines, considered by other Pain Management Practitioners as being common knowledge and practice, at the time of the intervention. For Medico-Legal purposes, it is also important to point out that variation in procedural techniques and pharmacological choices are the acceptable norm. The indications, contraindications, technique, and results of the above procedure should only be interpreted and judged by a Board-Certified Interventional Pain Specialist with extensive familiarity and expertise in the same exact procedure and technique.

## 2020-11-16 ENCOUNTER — Telehealth: Payer: Self-pay | Admitting: Licensed Clinical Social Worker

## 2020-11-16 ENCOUNTER — Telehealth: Payer: Self-pay | Admitting: *Deleted

## 2020-11-16 NOTE — Telephone Encounter (Signed)
Denies any post procedure issues. 

## 2020-11-16 NOTE — Telephone Encounter (Signed)
    Clinical Social Work  Chronic Care Management   Phone Outreach    11/16/2020 Name: Beth [name] MRN: [id number] DOB: 1945/04/09  Beth [name] is a 75 y.o. year old female who is a primary care patient of [name], DO .   Reason for referral: [place] [facility].    F/U phone call today to assess needs, progress and barriers with care plan goals.   Telephone outreach was unsuccessful. A HIPPA compliant phone message was left for the patient providing contact information and requesting a return call.   Plan:CCM LCSW will wait for return call. If no return call is received, Will reach out to patient again in the next 30 days .   Review of patient status, including review of consultants reports, relevant laboratory and other test results, and collaboration with appropriate care team members and the patient's provider was performed as part of comprehensive patient evaluation and provision of care management services.    Christa See, MSW, [facility] Care Management [email or web address] Phone [phone] 4:19 PM

## 2020-11-16 NOTE — Chronic Care Management (AMB) (Signed)
  Care Management  Collaboration  Note  11/16/2020 Name: Beth [name] MRN: [id number] DOB: May 22, 1945  Beth [name] is a 75 y.o. year old female who is a primary care patient of [name], DO. The CCM team was consulted reference care coordination needs for Mental Health Counseling and Resources.  Assessment: Patient was not interviewed or contacted during this encounter. See Care Plan or interventions for patient self-care actives.   Intervention:Conducted brief assessment, recommendations and relevant information discussed. CCM LCSW collaborated with [facility] regarding patient's continued need for psychiatry. CCM LCSW completed referral 08/2020. CCM LCSW will follow up with patient.    Follow up Plan: CCM LCSW will continue to collaborate with CCM team and PCP in order to meet patient's needs .    Review of patient past medical history, allergies, medications, and health status, including review of pertinent consultant reports was performed as part of comprehensive evaluation and provision of care management/care coordination services.   Care Plan Conditions to be addressed/monitored per PCP order: Anxiety and Depression, Mental Health Concerns    There are no care plans that you recently modified to display for this patient.  Christa See, MSW, [facility] Care Management [email or web address] Phone [phone] 4:22 PM

## 2020-11-17 ENCOUNTER — Ambulatory Visit (INDEPENDENT_AMBULATORY_CARE_PROVIDER_SITE_OTHER): Payer: Medicare Other | Admitting: Cardiology

## 2020-11-17 ENCOUNTER — Encounter: Payer: Self-pay | Admitting: Cardiology

## 2020-11-17 ENCOUNTER — Other Ambulatory Visit: Payer: Self-pay

## 2020-11-17 VITALS — BP 130/70 | HR 69 | Ht 65.0 in | Wt 157.0 lb

## 2020-11-17 DIAGNOSIS — R0609 Other forms of dyspnea: Secondary | ICD-10-CM | POA: Diagnosis not present

## 2020-11-17 DIAGNOSIS — I5189 Other ill-defined heart diseases: Secondary | ICD-10-CM

## 2020-11-17 DIAGNOSIS — E7800 Pure hypercholesterolemia, unspecified: Secondary | ICD-10-CM | POA: Diagnosis not present

## 2020-11-17 MED ORDER — TORSEMIDE 20 MG PO TABS: 20.0000 mg | ORAL_TABLET | Freq: Two times a day (BID) | ORAL | 2 refills | Status: DC

## 2020-11-17 NOTE — Progress Notes (Signed)
Cardiology Office Note:    Date:  11/17/2020   ID:  Beth [name], Beth [name] 20-Sep-1945, MRN [id number]  PCP:  [name], DO  Cardiologist:  Kate Sable, MD  Electrophysiologist:  None   Referring MD: Nobie Putnam *   Chief Complaint  Patient presents with   office visit per PCP for eval of pulmonary HTN and CHF    Patient reports BLLE edema    History of Present Illness:    Beth [name] is a 75 y.o. female with a hx of asthma, HFpEF, CVA, COPD, former smoker, hypertension, hyperlipidemia who presents for follow-up.   Previously seen due to shortness of breath over a year and a half ago.  Symptoms are associated with dyspnea.  Echocardiogram and Lexiscan Myoview were ordered to evaluate cardiac function.  Echo did not show normal systolic function, grade 2 diastolic dysfunction.  Severe pulmonary hypertension.  Lexiscan Myoview did not show any significant ischemia.  She takes Lasix 20 mg daily, still has lower extremity edema and shortness of breath with exertion.  Prior notes/testing Echo 09/2020 EF 55 to 83%, grade 2 diastolic dysfunction, RVSP 60 mmHg Lexiscan Myoview 04/2019, no evidence for ischemia.  Past Medical History:  Diagnosis Date   Allergy    Anxiety    Asthma    Chronic pain syndrome    discharged from pain clinic, hx of narcotics seeking behavior   COPD (chronic obstructive pulmonary disease) ([place] [place])    CVA (cerebral infarction)    Depression    Fibromyalgia    Headache    Hyperlipidemia    Hypertension    IBS (irritable bowel syndrome)    Stroke ([place])    Vitamin D deficiency     Past Surgical History:  Procedure Laterality Date   ABDOMINAL HYSTERECTOMY     APPENDECTOMY     BREAST EXCISIONAL BIOPSY  2011   Pt states lump removed, ? side, no scar seen   BREAST SURGERY  2011   biopsy   CERVICAL DISCECTOMY     CHOLECYSTECTOMY     SINUSOTOMY      Current Medications: Current Meds  Medication Sig   acetaminophen  (TYLENOL) 500 MG tablet Take 500 mg by mouth 2 (two) times daily as needed.   albuterol (VENTOLIN HFA) 108 (90 Base) MCG/ACT inhaler Inhale into the lungs every 6 (six) hours as needed for wheezing or shortness of breath.   aspirin 81 MG chewable tablet Chew 81 mg daily by mouth.   atorvastatin (LIPITOR) 40 MG tablet TAKE 1 TABLET BY MOUTH EVERY DAY   clonazePAM (KLONOPIN) 0.5 MG tablet Take 1 tablet (0.5mg ) every other day as needed for anxiety.   DULoxetine (CYMBALTA) 20 MG capsule Take 1 capsule (20 mg total) by mouth daily.   FLUoxetine (PROZAC) 20 MG capsule Take 3 capsules (60 mg total) by mouth daily.   gabapentin (NEURONTIN) 300 MG capsule Take 2 capsules (600 mg total) by mouth 3 (three) times daily.   HYDROcodone-acetaminophen (NORCO/VICODIN) 5-325 MG tablet Take 1-2 tablets by mouth daily as needed for moderate pain.   hydrOXYzine (ATARAX/VISTARIL) 25 MG tablet Take 25-50 mg by mouth daily as needed.   Melatonin 3 MG CAPS Take 10 mg by mouth at bedtime as needed (sleep).    metoprolol succinate (TOPROL-XL) 25 MG 24 hr tablet TAKE 1/2 TABLET(12.5 MG) BY MOUTH DAILY   montelukast (SINGULAIR) 10 MG tablet TAKE 1 TABLET(10 MG) BY MOUTH AT BEDTIME   multivitamin-iron-minerals-folic acid (CENTRUM) chewable tablet Chew 1  tablet by mouth daily.   QUEtiapine (SEROQUEL) 100 MG tablet Take 1 tablet (100 mg total) by mouth at bedtime.   torsemide (DEMADEX) 20 MG tablet Take 1 tablet (20 mg total) by mouth 2 (two) times daily.   TRELEGY ELLIPTA 200-62.5-25 MCG/INH AEPB INHALE 1 PUFF INTO THE LUNGS DAILY   [DISCONTINUED] furosemide (LASIX) 20 MG tablet Take 1 tablet (20 mg total) by mouth daily.     Allergies:   Bextra  [valdecoxib], Compazine [prochlorperazine edisylate], Lithium carbonate, and Lyrica [pregabalin]   Social History   Socioeconomic History   Marital status: Legally Separated    Spouse name: Not on file   Number of children: 2   Years of education: Not on file   Highest  education level: Not on file  Occupational History   Occupation: retired  Tobacco Use   Smoking status: Former    Packs/day: 0.25    Years: 57.00    Pack years: 14.25    Types: Cigarettes    Quit date: 10/24/2020    Years since quitting: 0.0   Smokeless tobacco: Never   Tobacco comments:    6-8cig daily--08/28/2020  Vaping Use   Vaping Use: Never used  Substance and Sexual Activity   Alcohol use: No    Alcohol/week: 0.0 standard drinks   Drug use: No   Sexual activity: Not Currently  Other Topics Concern   Not on file  Social History Narrative   Not on file   Social Determinants of Health   Financial Resource Strain: Low Risk    Difficulty of Paying Living Expenses: Not hard at all  Food Insecurity: No Food Insecurity   Worried About Charity fundraiser in the Last Year: Never true   [place] in the Last Year: Never true  Transportation Needs: No Transportation Needs   Lack of Transportation (Medical): No   Lack of Transportation (Non-Medical): No  Physical Activity: Inactive   Days of Exercise per Week: 0 days   Minutes of Exercise per Session: 0 min  Stress: No Stress Concern Present   Feeling of Stress : Not at all  Social Connections: Not on file     Family History: The patient's family history includes Alcohol abuse in her father; Anxiety disorder in her mother; Breast cancer (age of onset: 75) in her mother; Cancer in her father; Depression in her father and mother; Gallbladder disease in her father.  ROS:   Please see the history of present illness.     All other systems reviewed and are negative.  EKGs/Labs/Other Studies Reviewed:    The following studies were reviewed today:   EKG:  EKG is  ordered today.  The ekg ordered today demonstrates sinus rhythm, normal ECG.  Recent Labs: 01/14/2020: ALT 27; BUN 28; Creat 1.00; Hemoglobin 12.5; Platelets 274; Potassium 4.0; Sodium 143  Recent Lipid Panel    Component Value Date/Time   CHOL 135  04/10/2018 1115   CHOL 118 03/23/2015 1536   CHOL 185 12/25/2012 1449   TRIG 50 04/10/2018 1115   TRIG 93 12/25/2012 1449   HDL 59 04/10/2018 1115   HDL 44 03/23/2015 1536   HDL 39 (L) 12/25/2012 1449   CHOLHDL 2.3 04/10/2018 1115   VLDL 18 03/21/2016 0936   VLDL 19 12/25/2012 1449   LDLCALC 64 04/10/2018 1115   LDLCALC 127 (H) 12/25/2012 1449    Physical Exam:    VS:  BP 130/70 (BP Location: Left Arm, Patient Position: Sitting, Cuff  Size: Large)   Pulse 69   Ht 5\' 5"  (1.651 m)   Wt 157 lb (71.2 kg)   SpO2 96%   BMI 26.13 kg/m     Wt Readings from Last 3 Encounters:  11/17/20 157 lb (71.2 kg)  11/15/20 150 lb (68 kg)  11/14/20 152 lb (68.9 kg)     GEN:  Well nourished, well developed in no acute distress HEENT: Normal NECK: No JVD; No carotid bruits LYMPHATICS: No lymphadenopathy CARDIAC: RRR, faint 1/6 systolic murmur right lower sternal border.  No rubs, gallops RESPIRATORY: Diminished breath sounds at bases ABDOMEN: Soft, non-tender, non-distended MUSCULOSKELETAL:  1-2+ nonpitting edema; No deformity  SKIN: Warm and dry NEUROLOGIC:  Alert and oriented x 3 PSYCHIATRIC:  Normal affect   ASSESSMENT:    1. Dyspnea on exertion   2. Grade II diastolic dysfunction   3. Pure hypercholesterolemia     PLAN:    In order of problems listed above:  Dyspnea on exertion, echo shows normal systolic function, EF 55 to 51%, grade 2 diastolic dysfunction.  Lexiscan Myoview did not show any significant ischemia.  It is possible her symptoms may be secondary to COPD versus diastolic dysfunction.  Stop Lasix, start torsemide. Grade 2 diastolic dysfunction, continue Lasix. Hyperlipidemia, cholesterol controlled, continue Lipitor 40 mg daily.  Follow-up in 6 weeks   Medication Adjustments/Labs and Tests Ordered: Current medicines are reviewed at length with the patient today.  Concerns regarding medicines are outlined above.  Orders Placed This Encounter  Procedures    Basic metabolic panel    Meds ordered this encounter  Medications   torsemide (DEMADEX) 20 MG tablet    Sig: Take 1 tablet (20 mg total) by mouth 2 (two) times daily.    Dispense:  30 tablet    Refill:  2    Stop Lasix     Patient Instructions  Medication Instructions:  Your physician has recommended you make the following change in your medication:   STOP Lasix (furosemide).  START Torsemide 20 mg daily. An Rx has been sent to your pharmacy.   *If you need a refill on your cardiac medications before your next appointment, please call your pharmacy*   Lab Work: Your physician recommends that you return for lab work (bmp) in: 5 days  If you have labs (blood work) drawn today and your tests are completely normal, you will receive your results only by: MyChart Message (if you have MyChart) OR A paper copy in the mail If you have any lab test that is abnormal or we need to change your treatment, we will call you to review the results.   Testing/Procedures: None ordered   Follow-Up: At [facility], you and your health needs are our priority.  As part of our continuing mission to provide you with exceptional heart care, we have created designated Provider Care Teams.  These Care Teams include your primary Cardiologist (physician) and Advanced Practice Providers (APPs -  Physician Assistants and Nurse Practitioners) who all work together to provide you with the care you need, when you need it.  We recommend signing up for the patient portal called "MyChart".  Sign up information is provided on this After Visit Summary.  MyChart is used to connect with patients for Virtual Visits (Telemedicine).  Patients are able to view lab/test results, encounter notes, upcoming appointments, etc.  Non-urgent messages can be sent to your provider as well.   To learn more about what you can do with MyChart, go  to [email or web address].    Your next appointment:   6 week(s)  The format  for your next appointment:   In Person  Provider:   You may see Kate Sable, MD or one of the following Advanced Practice Providers on your designated Care Team:   Murray Hodgkins, NP Christell Faith, PA-C Marrianne Mood, PA-C Cadence Orr, [place]   Other Instructions N/A   Signed, Kate Sable, MD  11/17/2020 5:05 PM    [place]

## 2020-11-17 NOTE — Patient Instructions (Signed)
Medication Instructions:  Your physician has recommended you make the following change in your medication:   STOP Lasix (furosemide).  START Torsemide 20 mg daily. An Rx has been sent to your pharmacy.   *If you need a refill on your cardiac medications before your next appointment, please call your pharmacy*   Lab Work: Your physician recommends that you return for lab work (bmp) in: 5 days  If you have labs (blood work) drawn today and your tests are completely normal, you will receive your results only by: MyChart Message (if you have MyChart) OR A paper copy in the mail If you have any lab test that is abnormal or we need to change your treatment, we will call you to review the results.   Testing/Procedures: None ordered   Follow-Up: At [facility], you and your health needs are our priority.  As part of our continuing mission to provide you with exceptional heart care, we have created designated Provider Care Teams.  These Care Teams include your primary Cardiologist (physician) and Advanced Practice Providers (APPs -  Physician Assistants and Nurse Practitioners) who all work together to provide you with the care you need, when you need it.  We recommend signing up for the patient portal called "MyChart".  Sign up information is provided on this After Visit Summary.  MyChart is used to connect with patients for Virtual Visits (Telemedicine).  Patients are able to view lab/test results, encounter notes, upcoming appointments, etc.  Non-urgent messages can be sent to your provider as well.   To learn more about what you can do with MyChart, go to [email or web address].    Your next appointment:   6 week(s)  The format for your next appointment:   In Person  Provider:   You may see Kate Sable, MD or one of the following Advanced Practice Providers on your designated Care Team:   Murray Hodgkins, NP Christell Faith, PA-C Marrianne Mood, PA-C Cadence Kathlen Mody,  [place]   Other Instructions N/A

## 2020-11-20 ENCOUNTER — Telehealth: Payer: Medicare Other | Admitting: Student in an Organized Health Care Education/Training Program

## 2020-11-22 NOTE — Addendum Note (Signed)
Addended by: Raelene Bott, Aviraj Kentner L on: 11/22/2020 10:06 AM   Modules accepted: Orders

## 2020-11-23 ENCOUNTER — Other Ambulatory Visit (INDEPENDENT_AMBULATORY_CARE_PROVIDER_SITE_OTHER): Payer: Medicare Other

## 2020-11-23 ENCOUNTER — Other Ambulatory Visit: Payer: Self-pay

## 2020-11-23 DIAGNOSIS — R0609 Other forms of dyspnea: Secondary | ICD-10-CM | POA: Diagnosis not present

## 2020-11-23 DIAGNOSIS — I5189 Other ill-defined heart diseases: Secondary | ICD-10-CM

## 2020-11-24 ENCOUNTER — Telehealth: Payer: Self-pay

## 2020-11-24 DIAGNOSIS — I5032 Chronic diastolic (congestive) heart failure: Secondary | ICD-10-CM

## 2020-11-24 LAB — BASIC METABOLIC PANEL
BUN/Creatinine Ratio: 17 (ref 12–28)
BUN: 20 mg/dL (ref 8–27)
CO2: 32 mmol/L — ABNORMAL HIGH (ref 20–29)
Calcium: 9.5 mg/dL (ref 8.7–10.3)
Chloride: 101 mmol/L (ref 96–106)
Creatinine, Ser: 1.21 mg/dL — ABNORMAL HIGH (ref 0.57–1.00)
Glucose: 90 mg/dL (ref 70–99)
Potassium: 3.8 mmol/L (ref 3.5–5.2)
Sodium: 144 mmol/L (ref 134–144)
eGFR: 46 mL/min/{1.73_m2} — ABNORMAL LOW (ref >59)

## 2020-11-24 NOTE — Telephone Encounter (Signed)
Called patient and requested that she call back so that I can give her the result note as documented below and schedule her for BMP in 2 weeks.

## 2020-11-24 NOTE — Telephone Encounter (Signed)
-----   Message from Kate Sable, MD sent at 11/24/2020  9:40 AM EDT ----- Creatinine appears stable over the past year.  Repeat BMP in 2 weeks.  Thank you

## 2020-11-26 DIAGNOSIS — J441 Chronic obstructive pulmonary disease with (acute) exacerbation: Secondary | ICD-10-CM | POA: Diagnosis not present

## 2020-11-27 DIAGNOSIS — E785 Hyperlipidemia, unspecified: Secondary | ICD-10-CM

## 2020-11-27 DIAGNOSIS — J449 Chronic obstructive pulmonary disease, unspecified: Secondary | ICD-10-CM | POA: Diagnosis not present

## 2020-11-27 DIAGNOSIS — F332 Major depressive disorder, recurrent severe without psychotic features: Secondary | ICD-10-CM

## 2020-11-27 DIAGNOSIS — F339 Major depressive disorder, recurrent, unspecified: Secondary | ICD-10-CM | POA: Diagnosis not present

## 2020-11-27 NOTE — Telephone Encounter (Signed)
Attempted to call pt back. No answer. Lmtcb.  

## 2020-11-27 NOTE — Telephone Encounter (Signed)
Spoke with pt.  Repeat 2 week BMET lab has been scheduled 12/06/20 per pt convenience.  Pt has no further questions or needs at this time.

## 2020-11-27 NOTE — Telephone Encounter (Signed)
Patient returning call.

## 2020-11-29 ENCOUNTER — Telehealth: Payer: Self-pay | Admitting: Pharmacist

## 2020-11-29 ENCOUNTER — Telehealth: Payer: Medicare Other

## 2020-11-29 NOTE — Telephone Encounter (Signed)
  Chronic Care Management   Outreach Note  11/29/2020 Name: ALYNAH SCHONE MRN: [id number] DOB: 19-Jan-1946  Referred by: [name], DO Reason for referral : No chief complaint on file.   Was unable to reach patient via telephone today and have left HIPAA compliant voicemail asking patient to return my call.    Follow Up Plan: Will collaborate with Care Guide to outreach to schedule follow up with me  [name], PharmD, [place] Management [phone]

## 2020-12-06 ENCOUNTER — Telehealth: Payer: Self-pay

## 2020-12-06 ENCOUNTER — Other Ambulatory Visit: Payer: Self-pay

## 2020-12-06 ENCOUNTER — Other Ambulatory Visit (INDEPENDENT_AMBULATORY_CARE_PROVIDER_SITE_OTHER): Payer: Medicare Other

## 2020-12-06 DIAGNOSIS — I5032 Chronic diastolic (congestive) heart failure: Secondary | ICD-10-CM | POA: Diagnosis not present

## 2020-12-06 NOTE — Chronic Care Management (AMB) (Signed)
  Care Management   Note  12/06/2020 Name: LIANNAH YARBOUGH MRN: [id number] DOB: 1945-12-22  Cresenciano Lick [place] is a 75 y.o. year old female who is a primary care patient of [name], DO and is actively engaged with the care management team. I reached out to [name] by phone today to assist with re-scheduling a follow up visit with the Pharmacist  Follow up plan: Unsuccessful telephone outreach attempt made. A HIPAA compliant phone message was left for the patient providing contact information and requesting a return call.  The care management team will reach out to the patient again over the next 7 days.  If patient returns call to provider office, please advise to call [facility]  at [place], [place], [place], [place] [address] Direct Dial: [phone] [email or web address] Website: [email or web address]

## 2020-12-07 LAB — BASIC METABOLIC PANEL
BUN/Creatinine Ratio: 22 (ref 12–28)
BUN: 22 mg/dL (ref 8–27)
CO2: 29 mmol/L (ref 20–29)
Calcium: 9.3 mg/dL (ref 8.7–10.3)
Chloride: 100 mmol/L (ref 96–106)
Creatinine, Ser: 1 mg/dL (ref 0.57–1.00)
Glucose: 86 mg/dL (ref 70–99)
Potassium: 3.5 mmol/L (ref 3.5–5.2)
Sodium: 144 mmol/L (ref 134–144)
eGFR: 58 mL/min/{1.73_m2} — ABNORMAL LOW (ref >59)

## 2020-12-07 NOTE — Chronic Care Management (AMB) (Signed)
  Care Management   Note  12/07/2020 Name: Beth [name] MRN: [id number] DOB: [birth year]  [name] is a 75 y.o. year old female who is a primary care patient of [name], DO and is actively engaged with the care management team. I reached out to [name] by phone today to assist with re-scheduling a follow up visit with the Pharmacist  Follow up plan: Telephone appointment with care management team member scheduled for:12/15/2020   [name], [place], [place], [place] [address] Direct Dial: [phone] [name] Website: [email or web address]

## 2020-12-08 ENCOUNTER — Telehealth: Payer: Self-pay

## 2020-12-08 DIAGNOSIS — I5032 Chronic diastolic (congestive) heart failure: Secondary | ICD-10-CM

## 2020-12-08 MED ORDER — POTASSIUM CHLORIDE ER 10 MEQ PO TBCR: 10.0000 meq | EXTENDED_RELEASE_TABLET | Freq: Every day | ORAL | 1 refills | Status: DC

## 2020-12-08 NOTE — Telephone Encounter (Signed)
Spoke with pt. Notified of lab results and Dr. Thereasa Solo recc.  Pt voiced understanding. She will:  -  Continue Torsemide 20 mg BID -  START KCL 10 mEq daily -  Repeat BMP in 2 weeks  Rx has been sent to pt's pharmacy.  Lab appt scheduled in our office 12/19/20 at 2:30 PM.  Pt has no further questions at this time.

## 2020-12-08 NOTE — Telephone Encounter (Signed)
-----   Message from Kate Sable, MD sent at 12/08/2020  9:55 AM EST ----- Creatinine normal, continue torsemide, start KCl 10 mEq daily.  Recheck BMP in 2weeks.

## 2020-12-13 ENCOUNTER — Telehealth: Payer: Medicare Other

## 2020-12-15 ENCOUNTER — Ambulatory Visit (INDEPENDENT_AMBULATORY_CARE_PROVIDER_SITE_OTHER): Payer: Medicare Other | Admitting: Pharmacist

## 2020-12-15 DIAGNOSIS — J449 Chronic obstructive pulmonary disease, unspecified: Secondary | ICD-10-CM

## 2020-12-15 DIAGNOSIS — I5032 Chronic diastolic (congestive) heart failure: Secondary | ICD-10-CM

## 2020-12-15 NOTE — Chronic Care Management (AMB) (Signed)
Chronic Care Management CCM Pharmacy Note  12/15/2020 Name:  Beth [name] MRN:  [id number] DOB:  Oct 06, 1945   Subjective: Beth [name] is an 75 y.o. year old female who is a primary patient of [name], DO.  The CCM team was consulted for assistance with disease management and care coordination needs.    Engaged with patient by telephone for follow up visit for pharmacy case management and/or care coordination services.   Objective:  Medications Reviewed Today     Reviewed by Rennis Petty, RPH-CPP (Pharmacist) on 12/15/20 at 1133  Med List Status: <None>   Medication Order Taking? Sig Documenting Provider Last Dose Status Informant  acetaminophen (TYLENOL) 500 MG tablet [id number]  Take 500 mg by mouth 2 (two) times daily as needed. [provider]  Active   albuterol (VENTOLIN HFA) 108 (90 Base) MCG/ACT inhaler [id number] Yes Inhale into the lungs every 6 (six) hours as needed for wheezing or shortness of breath. [provider] Taking Active   aspirin 81 MG chewable tablet [id number]  Chew 81 mg daily by mouth. [provider]  Active Pharmacy Records           Med Note Dell Ponto Dec 18, 2016  4:14 PM)    atorvastatin (LIPITOR) 40 MG tablet [id number]  TAKE 1 TABLET BY MOUTH EVERY DAY Jearld Fenton, NP  Active   clonazePAM (KLONOPIN) 0.5 MG tablet [id number]  Take 1 tablet (0.5mg ) every other day as needed for anxiety. [name], DO  Active   DULoxetine (CYMBALTA) 20 MG capsule [id number]  Take 1 capsule (20 mg total) by mouth daily. [name], DO  Active   FLUoxetine (PROZAC) 20 MG capsule [id number]  Take 3 capsules (60 mg total) by mouth daily. Karamalegos, [name], DO  Active   gabapentin (NEURONTIN) 300 MG capsule [id number]  Take 2 capsules (600 mg total) by mouth 3 (three) times daily. [name], MD  Active   HYDROcodone-acetaminophen (NORCO/VICODIN) 5-325 MG tablet [id number]   Take 1-2 tablets by mouth daily as needed for moderate pain. [name], MD  Active   hydrOXYzine (ATARAX/VISTARIL) 25 MG tablet [id number]  Take 25-50 mg by mouth daily as needed. [provider]  Active   Melatonin 3 MG CAPS [id number]  Take 10 mg by mouth at bedtime as needed (sleep).  [provider]  Active Self  metoprolol succinate (TOPROL-XL) 25 MG 24 hr tablet [id number] Yes TAKE 1/2 TABLET(12.5 MG) BY MOUTH DAILY Jearld Fenton, NP Taking Active   montelukast (SINGULAIR) 10 MG tablet [id number]  TAKE 1 TABLET(10 MG) BY MOUTH AT BEDTIME [name], DO  Active   multivitamin-iron-minerals-folic acid (CENTRUM) chewable tablet [id number]  Chew 1 tablet by mouth daily. [provider]  Active   potassium chloride (KLOR-CON) 10 MEQ tablet [id number] Yes Take 1 tablet (10 mEq total) by mouth daily. Kate Sable, MD Taking Active   QUEtiapine (SEROQUEL) 100 MG tablet [id number]  Take 1 tablet (100 mg total) by mouth at bedtime. [name], DO  Active   torsemide (DEMADEX) 20 MG tablet [id number] Yes Take 1 tablet (20 mg total) by mouth 2 (two) times daily. Kate Sable, MD Taking Active   Donnal Debar 200-62.5-25 MCG/INH AEPB [id number] Yes INHALE 1 PUFF INTO THE LUNGS DAILY [name], MD Taking Active   Med List Note Rise Patience, RN 02/12/18 1055): Medication agreement signed 02/12/2018 UDS 02/24/17 03/25/17 PA requested for gabapentin  sent SB             Pertinent Labs:   Lab Results  Component Value Date   CREATININE 1.00 12/06/2020   BUN 22 12/06/2020   NA 144 12/06/2020   K 3.5 12/06/2020   CL 100 12/06/2020   CO2 29 12/06/2020   BP Readings from Last 3 Encounters:  11/17/20 130/70  11/15/20 118/82  11/14/20 (!) 138/56   Pulse Readings from Last 3 Encounters:  11/17/20 69  11/15/20 60  11/14/20 66     SDOH:  (Social Determinants of Health) assessments and interventions performed:    [place]  Review of patient past medical history, allergies, medications, health status, including review of consultants reports, laboratory and other test data, was performed as part of comprehensive evaluation and provision of chronic care management services.   Care Plan : PharmD - Med Mgmt  Updates made by Rennis Petty, RPH-CPP since 12/15/2020 12:00 AM     Problem: Disease Progression      Long-Range Goal: Disease Progression Prevented or Minimized   Start Date: 04/10/2020  Expected End Date: 07/09/2020  This Visit's Progress: On track  Recent Progress: On track  Priority: High  Note:   Current Barriers:  Chronic Disease Management support, education, and care coordination needs related to previous CVA, COPD, hypertension, hyperlipidemia, depression  Pharmacist Clinical Goal(s):  Over the next 90 days, patient will achieve adherence to monitoring guidelines and medication adherence to achieve therapeutic efficacy through collaboration with PharmD and provider.   Interventions: 1:1 collaboration with [name], DO regarding development and update of comprehensive plan of care as evidenced by provider attestation and co-signature Inter-disciplinary care team collaboration (see longitudinal plan of care) Perform chart review Office Visit with PCP on 10/18 Office Visit with [facility] on 10/21 for eval of pulmonary HTN and CHF. Provider advised patient: Stop Lasix, start torsemide Per telephone note from [facility] on 11/11, patient notified per Cardiologist to: Continue Torsemide 20 mg BID START KCL 10 mEq daily Repeat BMP in 2 weeks Reports planning to return to Cardiology for labs as scheduled on 11/22  Moderate to Severe COPD:  Patient followed by [facility] in [place] Current treatment: Trelegy 1 puff daily , rinse after use Patient confirms rinsing after use.  Albuterol inhaler as needed Overnight oxygen as  directed by Pulmonology Reports feels like breathing somewhat better since started overnight oxygen  Hypertension Reports taking: Torsemide 20 mg twice daily Potassium chloride 10 mEq daily Metoprolol ER 25 mg - 1/2 tablet (12.5 mg ) daily Confirms no longer taking/stopped furosemide as directed by Cardiologist Denies checking home blood pressure recently Encourage patient to restart monitoring, keep log of BP and bring record to medical appointments Reports had battery changed in scale and restart monitoring weight at home as directed by Nephrologist. Encourage patient to continue to monitor and bring this record to medical appointments  Coordination of Care:  Note patient has been trying to get established with new Psychiatrist From review of chart, note patient and CCM Social Worker discussed referral to [employer] for medication management Encourage patient to call to follow up with CCM Social Worker today about getting established with new psychiatrist  Patient Goals/Self-Care Activities Over the next 90 days, patient will:  To self administers medications as prescribed Encourage patient to obtain weekly pillbox to use as adherence tool Attends all scheduled provider appointments Calls pharmacy for medication refills Calls provider office for new  concerns or questions  Follow Up Plan: CM Pharmacist will outreach to patient by telephone next on 02/05/2021 at 11:15 am       Wallace Cullens, PharmD, Para [place], [facility] [phone]

## 2020-12-15 NOTE — Patient Instructions (Signed)
Visit Information  Thank you for taking time to visit with me today. Please don't hesitate to contact me if I can be of assistance to you before our next scheduled telephone appointment.  Telephone follow up appointment with care management team member scheduled for: 02/05/2021 at 11:15 am  If you need to cancel or re-schedule our visit, please call [phone] and our care guide team will be happy to assist you.  Following is a list of the goals we discussed today:  Our goal bad cholesterol, or LDL, is less than 70. This is why it is important to continue taking your atorvastatin  Please check your home blood pressure and weights, keep a log of the results and bring this with you to your medical appointments.   Feel free to call me with any questions or concerns. I look forward to our next call!   Wallace Cullens, PharmD, [place] [phone]  The patient verbalized understanding of instructions, educational materials, and care plan provided today and declined offer to receive copy of patient instructions, educational materials, and care plan.

## 2020-12-18 ENCOUNTER — Telehealth: Payer: Self-pay | Admitting: Licensed Clinical Social Worker

## 2020-12-18 NOTE — Telephone Encounter (Signed)
    Clinical Social Work  Care Management   Phone Outreach    12/18/2020 Name: Beth [name] MRN: [id number] DOB: 10/26/1945  Beth [name] is a 75 y.o. year old female who is a primary care patient of [name], DO .   Reason for referral: [place] and Resources.    F/U phone call today to assess needs, progress and barriers with care plan goals.   Telephone outreach was unsuccessful. 2nd unsuccessful telephone outreach attempt.  If unable to reach patient by phone on the 3rd attempt, will discontinue outreach calls but will be available at any time to provide services.   Plan:Will reach out to patient again in the next 14 days .   Review of patient status, including review of consultants reports, relevant laboratory and other test results, and collaboration with appropriate care team members and the patient's provider was performed as part of comprehensive patient evaluation and provision of care management services.    Christa See, MSW, [facility] Care Management [email or web address] Phone [phone] 4:28 AM

## 2020-12-19 ENCOUNTER — Other Ambulatory Visit (INDEPENDENT_AMBULATORY_CARE_PROVIDER_SITE_OTHER): Payer: Medicare Other

## 2020-12-19 ENCOUNTER — Other Ambulatory Visit: Payer: Self-pay

## 2020-12-19 DIAGNOSIS — I5032 Chronic diastolic (congestive) heart failure: Secondary | ICD-10-CM

## 2020-12-20 ENCOUNTER — Telehealth: Payer: Medicare Other

## 2020-12-20 LAB — BASIC METABOLIC PANEL
BUN/Creatinine Ratio: 19 (ref 12–28)
BUN: 26 mg/dL (ref 8–27)
CO2: 30 mmol/L — ABNORMAL HIGH (ref 20–29)
Calcium: 9.5 mg/dL (ref 8.7–10.3)
Chloride: 100 mmol/L (ref 96–106)
Creatinine, Ser: 1.35 mg/dL — ABNORMAL HIGH (ref 0.57–1.00)
Glucose: 121 mg/dL — ABNORMAL HIGH (ref 70–99)
Potassium: 3.7 mmol/L (ref 3.5–5.2)
Sodium: 144 mmol/L (ref 134–144)
eGFR: 41 mL/min/{1.73_m2} — ABNORMAL LOW (ref >59)

## 2020-12-25 ENCOUNTER — Telehealth: Payer: Self-pay | Admitting: Licensed Clinical Social Worker

## 2020-12-25 ENCOUNTER — Telehealth: Payer: Self-pay

## 2020-12-25 DIAGNOSIS — I5032 Chronic diastolic (congestive) heart failure: Secondary | ICD-10-CM

## 2020-12-25 MED ORDER — TORSEMIDE 20 MG PO TABS: 20.0000 mg | ORAL_TABLET | Freq: Every day | ORAL | 2 refills | Status: DC

## 2020-12-25 NOTE — Telephone Encounter (Signed)
Called patient and was unable to leave a message. Called her son Louie Casa per DPR on file and informed him of the instructions as documented below. Patient has a follow up appointment scheduled in 1 week, and BMP will be drawn at that time.

## 2020-12-25 NOTE — Telephone Encounter (Signed)
    Clinical Social Work  Care Management   Phone Outreach    12/25/2020 Name: Beth [name] MRN: [id number] DOB: July 01, 1945  Beth [name] is a 75 y.o. year old female who is a primary care patient of [name], DO .   Reason for referral: [place] and Resources.    F/U phone call today to assess needs, progress and barriers with care plan goals.   Telephone outreach was unsuccessful. A HIPPA compliant phone message was left for the patient providing contact information and requesting a return call.   Plan:CCM LCSW will wait for return call.  Review of patient status, including review of consultants reports, relevant laboratory and other test results, and collaboration with appropriate care team members and the patient's provider was performed as part of comprehensive patient evaluation and provision of care management services.    Christa See, MSW, [place] [facility] Care Management [email or web address] Phone [phone] 7:14 AM

## 2020-12-25 NOTE — Telephone Encounter (Signed)
-----   Message from Kate Sable, MD sent at 12/25/2020 11:55 AM EST ----- Creatinine elevated, reduce torsemide to 20 mg daily.  Repeat BMP in 7 days.

## 2020-12-27 ENCOUNTER — Ambulatory Visit
Payer: Medicare Other | Attending: Student in an Organized Health Care Education/Training Program | Admitting: Student in an Organized Health Care Education/Training Program

## 2020-12-27 ENCOUNTER — Other Ambulatory Visit: Payer: Self-pay

## 2020-12-27 DIAGNOSIS — J441 Chronic obstructive pulmonary disease with (acute) exacerbation: Secondary | ICD-10-CM | POA: Diagnosis not present

## 2020-12-27 DIAGNOSIS — M7918 Myalgia, other site: Secondary | ICD-10-CM

## 2020-12-27 DIAGNOSIS — J449 Chronic obstructive pulmonary disease, unspecified: Secondary | ICD-10-CM

## 2020-12-27 DIAGNOSIS — I5032 Chronic diastolic (congestive) heart failure: Secondary | ICD-10-CM

## 2020-12-27 NOTE — Progress Notes (Signed)
I attempted to call the patient however no response. Voicemail left instructing patient to call front desk office at [phone] to reschedule appointment. -Dr Holley Raring   Post-Procedure Evaluation  Procedure(s):  Lumbar paraspinal trigger point injection of the erector spinae, rhomboids, multifidus  Anxiolysis: Please see nurses note.  Effectiveness during initial hour after procedure (Ultra-Short Term Relief): 5 %   Local anesthetic used: Long-acting (4-6 hours) Effectiveness: Defined as any analgesic benefit obtained secondary to the administration of local anesthetics. This carries significant diagnostic value as to the etiological location, or anatomical origin, of the pain. Duration of benefit is expected to coincide with the duration of the local anesthetic used.  Effectiveness during initial 4-6 hours after procedure (Short-Term Relief): 5 %   Long-term benefit: Defined as any relief past the pharmacologic duration of the local anesthetics.  Effectiveness past the initial 6 hours after procedure (Long-Term Relief): 0 %

## 2021-01-01 ENCOUNTER — Encounter: Payer: Self-pay | Admitting: Cardiology

## 2021-01-01 ENCOUNTER — Other Ambulatory Visit: Payer: Self-pay

## 2021-01-01 ENCOUNTER — Ambulatory Visit (INDEPENDENT_AMBULATORY_CARE_PROVIDER_SITE_OTHER): Payer: Medicare Other | Admitting: Cardiology

## 2021-01-01 VITALS — BP 124/68 | HR 69 | Ht 65.0 in | Wt 159.8 lb

## 2021-01-01 DIAGNOSIS — E7800 Pure hypercholesterolemia, unspecified: Secondary | ICD-10-CM | POA: Diagnosis not present

## 2021-01-01 DIAGNOSIS — I5189 Other ill-defined heart diseases: Secondary | ICD-10-CM

## 2021-01-01 DIAGNOSIS — M7989 Other specified soft tissue disorders: Secondary | ICD-10-CM

## 2021-01-01 MED ORDER — FUROSEMIDE 20 MG PO TABS: 20.0000 mg | ORAL_TABLET | Freq: Every day | ORAL | 1 refills | Status: DC

## 2021-01-01 NOTE — Patient Instructions (Signed)
Medication Instructions:   Your physician has recommended you make the following change in your medication:    STOP taking your Torsemide.  2.   START taking Furosemide 20 MG once a day.  *If you need a refill on your cardiac medications before your next appointment, please call your pharmacy*   Lab Work:  BMP to be drawn in office today.   Testing/Procedures: None ordered   Follow-Up: At [facility], you and your health needs are our priority.  As part of our continuing mission to provide you with exceptional heart care, we have created designated Provider Care Teams.  These Care Teams include your primary Cardiologist (physician) and Advanced Practice Providers (APPs -  Physician Assistants and Nurse Practitioners) who all work together to provide you with the care you need, when you need it.  We recommend signing up for the patient portal called "MyChart".  Sign up information is provided on this After Visit Summary.  MyChart is used to connect with patients for Virtual Visits (Telemedicine).  Patients are able to view lab/test results, encounter notes, upcoming appointments, etc.  Non-urgent messages can be sent to your provider as well.   To learn more about what you can do with MyChart, go to [email or web address].    Your next appointment:   6 month(s)  The format for your next appointment:   In Person  Provider:   You may see Kate Sable, MD or one of the following Advanced Practice Providers on your designated Care Team:   Murray Hodgkins, NP Christell Faith, PA-C Cadence Kathlen Mody, [place]    Other Instructions

## 2021-01-01 NOTE — Progress Notes (Signed)
Cardiology Office Note:    Date:  01/01/2021   ID:  Channing, Savich [birth year], MRN [id number]  PCP:  [name], DO  Cardiologist:  Kate Sable, MD  Electrophysiologist:  None   Referring MD: Nobie Putnam *   No chief complaint on file.   History of Present Illness:    Beth [name] is a 75 y.o. female with a hx of asthma, HFpEF, CVA, COPD, former smoker, hypertension, hyperlipidemia who presents for follow-up.   Previously seen for leg edema and diastolic dysfunction.  Lasix was stopped, torsemide was started.  Edema did not improve, torsemide was reduced to 25 mg daily from 40mg  due to worsening creatinine.  Sees pulmonary medicine for COPD.  Last echo with pulmonary hypertension.   Prior notes/testing Echo 09/2020 EF 55 to 12%, grade 2 diastolic dysfunction, RVSP 60 mmHg Lexiscan Myoview 04/2019, no evidence for ischemia.  Past Medical History:  Diagnosis Date   Allergy    Anxiety    Asthma    Chronic pain syndrome    discharged from pain clinic, hx of narcotics seeking behavior   COPD (chronic obstructive pulmonary disease) ([place])    CVA (cerebral infarction)    Depression    Fibromyalgia    Headache    Hyperlipidemia    Hypertension    IBS (irritable bowel syndrome)    Stroke ([place])    Vitamin D deficiency     Past Surgical History:  Procedure Laterality Date   ABDOMINAL HYSTERECTOMY     APPENDECTOMY     BREAST EXCISIONAL BIOPSY  2011   Pt states lump removed, ? side, no scar seen   BREAST SURGERY  2011   biopsy   CERVICAL DISCECTOMY     CHOLECYSTECTOMY     SINUSOTOMY      Current Medications: Current Meds  Medication Sig   acetaminophen (TYLENOL) 500 MG tablet Take 500 mg by mouth 2 (two) times daily as needed.   albuterol (VENTOLIN HFA) 108 (90 Base) MCG/ACT inhaler Inhale into the lungs every 6 (six) hours as needed for wheezing or shortness of breath.   aspirin 81 MG chewable tablet Chew 81 mg daily by mouth.    atorvastatin (LIPITOR) 40 MG tablet TAKE 1 TABLET BY MOUTH EVERY DAY   clonazePAM (KLONOPIN) 0.5 MG tablet Take 1 tablet (0.5mg ) every other day as needed for anxiety.   DULoxetine (CYMBALTA) 20 MG capsule Take 1 capsule (20 mg total) by mouth daily.   FLUoxetine (PROZAC) 20 MG capsule Take 3 capsules (60 mg total) by mouth daily.   furosemide (LASIX) 20 MG tablet Take 1 tablet (20 mg total) by mouth daily.   gabapentin (NEURONTIN) 300 MG capsule Take 2 capsules (600 mg total) by mouth 3 (three) times daily.   HYDROcodone-acetaminophen (NORCO/VICODIN) 5-325 MG tablet Take 1-2 tablets by mouth daily as needed for moderate pain.   hydrOXYzine (ATARAX/VISTARIL) 25 MG tablet Take 25-50 mg by mouth daily as needed.   Melatonin 3 MG CAPS Take 10 mg by mouth at bedtime as needed (sleep).    metoprolol succinate (TOPROL-XL) 25 MG 24 hr tablet TAKE 1/2 TABLET(12.5 MG) BY MOUTH DAILY   montelukast (SINGULAIR) 10 MG tablet TAKE 1 TABLET(10 MG) BY MOUTH AT BEDTIME   multivitamin-iron-minerals-folic acid (CENTRUM) chewable tablet Chew 1 tablet by mouth daily.   potassium chloride (KLOR-CON) 10 MEQ tablet Take 1 tablet (10 mEq total) by mouth daily.   QUEtiapine (SEROQUEL) 100 MG tablet Take 1 tablet (100 mg total)  by mouth at bedtime.   TRELEGY ELLIPTA 200-62.5-25 MCG/INH AEPB INHALE 1 PUFF INTO THE LUNGS DAILY   [DISCONTINUED] torsemide (DEMADEX) 20 MG tablet Take 1 tablet (20 mg total) by mouth daily.     Allergies:   Bextra  [valdecoxib], Compazine [prochlorperazine edisylate], Lithium carbonate, and Lyrica [pregabalin]   Social History   Socioeconomic History   Marital status: Legally Separated    Spouse name: Not on file   Number of children: 2   Years of education: Not on file   Highest education level: Not on file  Occupational History   Occupation: retired  Tobacco Use   Smoking status: Former    Packs/day: 0.25    Years: 57.00    Pack years: 14.25    Types: Cigarettes    Quit date:  10/24/2020    Years since quitting: 0.1   Smokeless tobacco: Never   Tobacco comments:    6-8cig daily--08/28/2020  Vaping Use   Vaping Use: Never used  Substance and Sexual Activity   Alcohol use: No    Alcohol/week: 0.0 standard drinks   Drug use: No   Sexual activity: Not Currently  Other Topics Concern   Not on file  Social History Narrative   Not on file   Social Determinants of Health   Financial Resource Strain: Low Risk    Difficulty of Paying Living Expenses: Not hard at all  Food Insecurity: No Food Insecurity   Worried About Charity fundraiser in the Last Year: Never true   [place] in the Last Year: Never true  Transportation Needs: No Transportation Needs   Lack of Transportation (Medical): No   Lack of Transportation (Non-Medical): No  Physical Activity: Inactive   Days of Exercise per Week: 0 days   Minutes of Exercise per Session: 0 min  Stress: No Stress Concern Present   Feeling of Stress : Not at all  Social Connections: Not on file     Family History: The patient's family history includes Alcohol abuse in her father; Anxiety disorder in her mother; Breast cancer (age of onset: 19) in her mother; Cancer in her father; Depression in her father and mother; Gallbladder disease in her father.  ROS:   Please see the history of present illness.     All other systems reviewed and are negative.  EKGs/Labs/Other Studies Reviewed:    The following studies were reviewed today:   EKG:  EKG is  ordered today.  The ekg ordered today demonstrates sinus rhythm  Recent Labs: 01/14/2020: ALT 27; Hemoglobin 12.5; Platelets 274 12/19/2020: BUN 26; Creatinine, Ser 1.35; Potassium 3.7; Sodium 144  Recent Lipid Panel    Component Value Date/Time   CHOL 135 04/10/2018 1115   CHOL 118 03/23/2015 1536   CHOL 185 12/25/2012 1449   TRIG 50 04/10/2018 1115   TRIG 93 12/25/2012 1449   HDL 59 04/10/2018 1115   HDL 44 03/23/2015 1536   HDL 39 (L) 12/25/2012 1449    CHOLHDL 2.3 04/10/2018 1115   VLDL 18 03/21/2016 0936   VLDL 19 12/25/2012 1449   LDLCALC 64 04/10/2018 1115   LDLCALC 127 (H) 12/25/2012 1449    Physical Exam:    VS:  BP 124/68   Pulse 69   Ht 5\' 5"  (1.651 m)   Wt 159 lb 12.8 oz (72.5 kg)   SpO2 95%   BMI 26.59 kg/m     Wt Readings from Last 3 Encounters:  01/01/21 159 lb 12.8  oz (72.5 kg)  11/17/20 157 lb (71.2 kg)  11/15/20 150 lb (68 kg)     GEN:  Well nourished, well developed in no acute distress HEENT: Normal NECK: No JVD; No carotid bruits LYMPHATICS: No lymphadenopathy CARDIAC: RRR, faint 1/6 systolic murmur right lower sternal border.  No rubs, gallops RESPIRATORY: Diminished breath sounds at bases ABDOMEN: Soft, non-tender, non-distended MUSCULOSKELETAL:  1-2+ nonpitting edema sparing foot; No deformity  SKIN: Warm and dry NEUROLOGIC:  Alert and oriented x 3 PSYCHIATRIC:  Normal affect   ASSESSMENT:    1. Grade II diastolic dysfunction   2. Leg swelling   3. Pure hypercholesterolemia      PLAN:    In order of problems listed above:  Diastolic dysfunction EF 55 to 25%, grade 2 diastolic dysfunction.  Lexiscan Myoview did not show any significant ischemia.  COPD also contributing to shortness of breath.  Edema likely lymphedema.  Stop torsemide, resume Lasix 20 mg daily. Leg edema sparing foot consistent with lymphedema.  Refer to vein and vascular clinic for possible lymphedema management. Hyperlipidemia, cholesterol controlled, continue Lipitor 40 mg daily.  Follow-up in 6 months.   Medication Adjustments/Labs and Tests Ordered: Current medicines are reviewed at length with the patient today.  Concerns regarding medicines are outlined above.  Orders Placed This Encounter  Procedures   Basic metabolic panel   Ambulatory referral to Vascular Surgery   EKG 12-Lead     Meds ordered this encounter  Medications   furosemide (LASIX) 20 MG tablet    Sig: Take 1 tablet (20 mg total) by mouth  daily.    Dispense:  90 tablet    Refill:  1      Patient Instructions  Medication Instructions:   Your physician has recommended you make the following change in your medication:    STOP taking your Torsemide.  2.   START taking Furosemide 20 MG once a day.  *If you need a refill on your cardiac medications before your next appointment, please call your pharmacy*   Lab Work:  BMP to be drawn in office today.   Testing/Procedures: None ordered   Follow-Up: At [facility], you and your health needs are our priority.  As part of our continuing mission to provide you with exceptional heart care, we have created designated Provider Care Teams.  These Care Teams include your primary Cardiologist (physician) and Advanced Practice Providers (APPs -  Physician Assistants and Nurse Practitioners) who all work together to provide you with the care you need, when you need it.  We recommend signing up for the patient portal called "MyChart".  Sign up information is provided on this After Visit Summary.  MyChart is used to connect with patients for Virtual Visits (Telemedicine).  Patients are able to view lab/test results, encounter notes, upcoming appointments, etc.  Non-urgent messages can be sent to your provider as well.   To learn more about what you can do with MyChart, go to [email or web address].    Your next appointment:   6 month(s)  The format for your next appointment:   In Person  Provider:   You may see Kate Sable, MD or one of the following Advanced Practice Providers on your designated Care Team:   Murray Hodgkins, NP Christell Faith, PA-C Cadence Kathlen Mody, [place]    Other Instructions    Signed, Kate Sable, MD  01/01/2021 4:12 PM    [place]

## 2021-01-02 LAB — BASIC METABOLIC PANEL
BUN/Creatinine Ratio: 17 (ref 12–28)
BUN: 19 mg/dL (ref 8–27)
CO2: 29 mmol/L (ref 20–29)
Calcium: 9.3 mg/dL (ref 8.7–10.3)
Chloride: 102 mmol/L (ref 96–106)
Creatinine, Ser: 1.1 mg/dL — ABNORMAL HIGH (ref 0.57–1.00)
Glucose: 111 mg/dL — ABNORMAL HIGH (ref 70–99)
Potassium: 3.6 mmol/L (ref 3.5–5.2)
Sodium: 143 mmol/L (ref 134–144)
eGFR: 52 mL/min/{1.73_m2} — ABNORMAL LOW (ref >59)

## 2021-01-03 ENCOUNTER — Other Ambulatory Visit: Payer: Self-pay | Admitting: Family Medicine

## 2021-01-03 ENCOUNTER — Other Ambulatory Visit: Payer: Self-pay | Admitting: Internal Medicine

## 2021-01-03 DIAGNOSIS — J3089 Other allergic rhinitis: Secondary | ICD-10-CM

## 2021-01-03 DIAGNOSIS — E785 Hyperlipidemia, unspecified: Secondary | ICD-10-CM

## 2021-01-03 DIAGNOSIS — J302 Other seasonal allergic rhinitis: Secondary | ICD-10-CM

## 2021-01-03 NOTE — Telephone Encounter (Signed)
Requested Prescriptions  Pending Prescriptions Disp Refills  . montelukast (SINGULAIR) 10 MG tablet [Pharmacy Med Name: MONTELUKAST 10MG  TABLETS] 90 tablet 0    Sig: TAKE 1 TABLET(10 MG) BY MOUTH AT BEDTIME     Pulmonology:  Leukotriene Inhibitors Passed - 01/03/2021 12:10 PM      Passed - Valid encounter within last 12 months    Recent Outpatient Visits          1 month ago Major depressive disorder, recurrent, severe w/o psychotic behavior ([place])   [place], DO   8 months ago Major depressive disorder, recurrent, severe w/o psychotic behavior ([place])   [facility] [name], DO   11 months ago [facility] [place], Lupita Raider, [place]   1 year ago Benign hypertension   [place], FNP   1 year ago Leg swelling   [facility], Lupita Raider, FNP      Future Appointments            In 6 months [place], Aaron Edelman, MD [facility], [place]   In 8 months  [facility], [facility]

## 2021-01-03 NOTE — Telephone Encounter (Signed)
Requested medication (s) are due for refill today: Yes  Requested medication (s) are on the active medication list: Yes  Last refill:  07/05/20 #90/1 RF  Future visit scheduled: Yes  Notes to clinic:  Unable to refill per protocol due to failed labs, no updated results.      Requested Prescriptions  Pending Prescriptions Disp Refills   atorvastatin (LIPITOR) 40 MG tablet [Pharmacy Med Name: ATORVASTATIN 40MG  TABLETS] 90 tablet 1    Sig: TAKE 1 TABLET BY MOUTH EVERY DAY     Cardiovascular:  Antilipid - Statins Failed - 01/03/2021 12:14 PM      Failed - Total Cholesterol in normal range and within 360 days    Cholesterol, Total  Date Value Ref Range Status  03/23/2015 118 100 - 199 mg/dL Final   Cholesterol  Date Value Ref Range Status  04/10/2018 135 <200 mg/dL Final  12/25/2012 185 0 - 200 mg/dL Final          Failed - LDL in normal range and within 360 days    Ldl Cholesterol, Calc  Date Value Ref Range Status  12/25/2012 127 (H) 0 - 100 mg/dL Final   LDL Cholesterol (Calc)  Date Value Ref Range Status  04/10/2018 64 mg/dL (calc) Final    Comment:    Reference range: <100 . Desirable range <100 mg/dL for primary prevention;   <70 mg/dL for patients with CHD or diabetic patients  with > or = 2 CHD risk factors. Marland Kitchen LDL-C is now calculated using the Martin-Hopkins  calculation, which is a validated novel method providing  better accuracy than the Friedewald equation in the  estimation of LDL-C.  Cresenciano Genre et al. Annamaria Helling. 8850;277(41): 2061-2068  (http://education.[employer].com/faq/FAQ164)           Failed - HDL in normal range and within 360 days    HDL Cholesterol  Date Value Ref Range Status  12/25/2012 39 (L) 40 - 60 mg/dL Final   HDL  Date Value Ref Range Status  04/10/2018 59 > OR = 50 mg/dL Final  03/23/2015 44 >39 mg/dL Final          Failed - Triglycerides in normal range and within 360 days    Triglycerides  Date Value Ref Range Status   04/10/2018 50 <150 mg/dL Final  12/25/2012 93 0 - 200 mg/dL Final          Passed - Patient is not pregnant      Passed - Valid encounter within last 12 months    Recent Outpatient Visits           1 month ago Major depressive disorder, recurrent, severe w/o psychotic behavior ([place])   [place], DO   8 months ago Major depressive disorder, recurrent, severe w/o psychotic behavior ([place])   [facility] [name], DO   11 months ago [facility] [place], Lupita Raider, [place]   1 year ago Benign hypertension   [place], FNP   1 year ago Leg swelling   [facility], Lupita Raider, FNP       Future Appointments             In 6 months Agbor-Etang, Aaron Edelman, MD [facility], [place]   In 8 months  [facility], [facility]

## 2021-01-04 ENCOUNTER — Telehealth: Payer: Self-pay | Admitting: Cardiology

## 2021-01-04 NOTE — Telephone Encounter (Signed)
*  STAT* If patient is at the pharmacy, call can be transferred to refill team.   1. Which medications need to be refilled? (please list name of each medication and dose if known) Lasix 20 mg 1 tablet daily  2. Which pharmacy/location (including street and city if local pharmacy) is medication to be sent to? [facility]  3. Do they need a 30 day or 90 day supply? 90 day

## 2021-01-04 NOTE — Telephone Encounter (Signed)
Refills sent on 01/01/21 with receipt confirmed.

## 2021-01-08 MED ORDER — FUROSEMIDE 20 MG PO TABS: 20.0000 mg | ORAL_TABLET | Freq: Every day | ORAL | 2 refills | Status: DC

## 2021-01-08 NOTE — Telephone Encounter (Signed)
Requested Prescriptions   Signed Prescriptions Disp Refills   furosemide (LASIX) 20 MG tablet 90 tablet 2    Sig: Take 1 tablet (20 mg total) by mouth daily.    Authorizing Provider: Kate Sable    Ordering User: Britt Bottom

## 2021-01-08 NOTE — Telephone Encounter (Signed)
Patient calling States pharmacy still hasn't received  Please resend

## 2021-01-15 ENCOUNTER — Telehealth: Payer: Medicare Other

## 2021-01-26 DIAGNOSIS — J441 Chronic obstructive pulmonary disease with (acute) exacerbation: Secondary | ICD-10-CM | POA: Diagnosis not present

## 2021-02-05 ENCOUNTER — Ambulatory Visit (INDEPENDENT_AMBULATORY_CARE_PROVIDER_SITE_OTHER): Payer: Medicare Other | Admitting: Pharmacist

## 2021-02-05 DIAGNOSIS — J449 Chronic obstructive pulmonary disease, unspecified: Secondary | ICD-10-CM

## 2021-02-05 DIAGNOSIS — I5032 Chronic diastolic (congestive) heart failure: Secondary | ICD-10-CM

## 2021-02-05 NOTE — Chronic Care Management (AMB) (Signed)
Chronic Care Management CCM Pharmacy Note  02/05/2021 Name:  Beth [name] MRN:  [id number] DOB:  [birth year]   Subjective: Beth [name] is an 76 y.o. year old female who is a primary patient of [name], DO.  The CCM team was consulted for assistance with disease management and care coordination needs.    Engaged with patient by telephone for follow up visit for pharmacy case management and/or care coordination services.   Objective:  Medications Reviewed Today     Reviewed by Kate Sable, MD (Physician) on 01/01/21 at 1518  Med List Status: <None>   Medication Order Taking? Sig Documenting Provider Last Dose Status Informant  acetaminophen (TYLENOL) 500 MG tablet [id number] Yes Take 500 mg by mouth 2 (two) times daily as needed. [provider] Taking Active   albuterol (VENTOLIN HFA) 108 (90 Base) MCG/ACT inhaler [id number] Yes Inhale into the lungs every 6 (six) hours as needed for wheezing or shortness of breath. [provider] Taking Active   aspirin 81 MG chewable tablet [id number] Yes Chew 81 mg daily by mouth. [provider] Taking Active Pharmacy Records           Med Note Dell Ponto Dec 18, 2016  4:14 PM)    atorvastatin (LIPITOR) 40 MG tablet [id number] Yes TAKE 1 TABLET BY MOUTH EVERY DAY Jearld Fenton, NP Taking Active   clonazePAM (KLONOPIN) 0.5 MG tablet [id number] Yes Take 1 tablet (0.5mg ) every other day as needed for anxiety. [name], DO Taking Active   DULoxetine (CYMBALTA) 20 MG capsule [id number] Yes Take 1 capsule (20 mg total) by mouth daily. [name], DO Taking Active   FLUoxetine (PROZAC) 20 MG capsule [id number] Yes Take 3 capsules (60 mg total) by mouth daily. [name], DO Taking Active   furosemide (LASIX) 20 MG tablet [id number] Yes Take 1 tablet (20 mg total) by mouth daily. Kate Sable, MD  Active   gabapentin (NEURONTIN) 300 MG capsule  [id number] Yes Take 2 capsules (600 mg total) by mouth 3 (three) times daily. [name], MD Taking Active   HYDROcodone-acetaminophen (NORCO/VICODIN) 5-325 MG tablet [id number] Yes Take 1-2 tablets by mouth daily as needed for moderate pain. [name], MD Taking Active   hydrOXYzine (ATARAX/VISTARIL) 25 MG tablet [id number] Yes Take 25-50 mg by mouth daily as needed. [provider] Taking Active   Melatonin 3 MG CAPS [id number] Yes Take 10 mg by mouth at bedtime as needed (sleep).  [provider] Taking Active Self  metoprolol succinate (TOPROL-XL) 25 MG 24 hr tablet [id number] Yes TAKE 1/2 TABLET(12.5 MG) BY MOUTH DAILY Baity, Coralie Keens, NP Taking Active   montelukast (SINGULAIR) 10 MG tablet [id number] Yes TAKE 1 TABLET(10 MG) BY MOUTH AT BEDTIME [name], DO Taking Active   multivitamin-iron-minerals-folic acid (CENTRUM) chewable tablet [id number] Yes Chew 1 tablet by mouth daily. [provider] Taking Active   potassium chloride (KLOR-CON) 10 MEQ tablet [id number] Yes Take 1 tablet (10 mEq total) by mouth daily. Kate Sable, MD Taking Active   QUEtiapine (SEROQUEL) 100 MG tablet [id number] Yes Take 1 tablet (100 mg total) by mouth at bedtime. [name], DO Taking Active   Donnal Debar 200-62.5-25 MCG/INH AEPB [id number] Yes INHALE 1 PUFF INTO THE LUNGS DAILY [name], MD Taking Active   Med List Note Rise Patience, RN 02/12/18 1055): Medication agreement signed 02/12/2018 UDS 02/24/17 03/25/17 PA requested for gabapentin sent SB  Pertinent Labs:   Lab Results  Component Value Date   CHOL 135 04/10/2018   HDL 59 04/10/2018   LDLCALC 64 04/10/2018   TRIG 50 04/10/2018   CHOLHDL 2.3 04/10/2018   Lab Results  Component Value Date   CREATININE 1.10 (H) 01/01/2021   BUN 19 01/01/2021   NA 143 01/01/2021   K 3.6 01/01/2021   CL 102 01/01/2021   CO2 29 01/01/2021   BP Readings from Last 3  Encounters:  01/01/21 124/68  11/17/20 130/70  11/15/20 118/82   Pulse Readings from Last 3 Encounters:  01/01/21 69  11/17/20 69  11/15/20 60     SDOH:  (Social Determinants of Health) assessments and interventions performed:    [place]  Review of patient past medical history, allergies, medications, health status, including review of consultants reports, laboratory and other test data, was performed as part of comprehensive evaluation and provision of chronic care management services.   Care Plan : PharmD - Med Mgmt  Updates made by Rennis Petty, RPH-CPP since 02/05/2021 12:00 AM     Problem: Disease Progression      Long-Range Goal: Disease Progression Prevented or Minimized   Start Date: 04/10/2020  Expected End Date: 07/09/2020  Recent Progress: On track  Priority: High  Note:   Current Barriers:  Chronic Disease Management support, education, and care coordination needs related to previous CVA, COPD, hypertension, hyperlipidemia, depression  Pharmacist Clinical Goal(s):  Over the next 90 days, patient will achieve adherence to monitoring guidelines and medication adherence to achieve therapeutic efficacy through collaboration with PharmD and provider.   Interventions: 1:1 collaboration with [name], DO regarding development and update of comprehensive plan of care as evidenced by provider attestation and co-signature Inter-disciplinary care team collaboration (see longitudinal plan of care) Perform chart review Office Visit with [facility] on 11/30 Office Visit with [facility] on 12/5. Provider advised patient: Edema likely lymphedema.  Stop torsemide, resume Lasix 20 mg daily Referral placed to vein and vascular clinic for possible lymphedema management Today patient reports tested positive for COVID-19 infection ~Christmas and was sick until ~New Years, but feeling better now, just has low  energy level Reports has had soreness in her left upper arm for ~2 weeks. States upper arm just stays sore, but denies other symptoms. Encourage patient to call to schedule evaluation by PCP, but patient denies need at this time. Confirms will call if soreness does not improve or has new symptoms Reports has not yet received call from Vein and Vascular clinic, but will call specialist regarding referral today  Moderate to Severe COPD:  Patient followed by [facility] in [place] Current treatment: Trelegy 1 puff daily , rinse after use Patient confirms rinsing after use.  Albuterol inhaler as needed Overnight oxygen as directed by Pulmonology Identify patient in need of refill of albuterol inhaler - confirms will call pharmacy for refill  Hypertension Reports taking: Furosemide 20 mg daily Potassium chloride 10 mEq daily Metoprolol ER 25 mg - 1/2 tablet (12.5 mg ) daily Confirms no longer taking/stopped torsemide as directed by Cardiologist Reports checked blood pressure recently, but does not recall/did not record result Encourage patient monitor home blood pressure, and restart keeping log of BP and bring record to medical appointments Encourage patient to continue to monitor home weights and bring this record to medical appointments  Coordination of Care:  Note patient has been trying to get established with new Psychiatrist From  review of chart, note patient and CCM Education officer, museum discussed referral to [employer] for medication management Again encourage patient to call to follow up with CCM Social Worker today about getting established with new psychiatrist  Patient Goals/Self-Care Activities Over the next 90 days, patient will:  To self administers medications as prescribed Encourage patient to obtain weekly pillbox to use as adherence tool Attends all scheduled provider appointments Calls pharmacy for medication refills Calls provider office for new concerns or  questions  Follow Up Plan: CM Pharmacist will outreach to patient by telephone next on 03/26/2021 at 11:15 AM       Wallace Cullens, PharmD, [place], [facility] [place] [phone]

## 2021-02-05 NOTE — Patient Instructions (Signed)
Visit Information  Thank you for taking time to visit with me today. Please don't hesitate to contact me if I can be of assistance to you before our next scheduled telephone appointment.  Following are the goals we discussed today:   Goals Addressed             This Visit's Progress    Pharmacy Goals       Our goal bad cholesterol, or LDL, is less than 70. This is why it is important to continue taking your atorvastatin  Please check your home blood pressure and weights, keep a log of the results and bring this with you to your medical appointments.   Feel free to call me with any questions or concerns. I look forward to our next call!  Wallace Cullens, PharmD, [place] [phone]         Our next appointment is by telephone on 03/26/2021 at 11:15 AM  Please call the care guide team at [phone] if you need to cancel or reschedule your appointment.    The patient verbalized understanding of instructions, educational materials, and care plan provided today and declined offer to receive copy of patient instructions, educational materials, and care plan.

## 2021-02-06 ENCOUNTER — Ambulatory Visit: Payer: Self-pay

## 2021-02-06 DIAGNOSIS — Z87891 Personal history of nicotine dependence: Secondary | ICD-10-CM | POA: Diagnosis not present

## 2021-02-06 DIAGNOSIS — J849 Interstitial pulmonary disease, unspecified: Secondary | ICD-10-CM | POA: Diagnosis not present

## 2021-02-06 DIAGNOSIS — R079 Chest pain, unspecified: Secondary | ICD-10-CM | POA: Diagnosis not present

## 2021-02-06 DIAGNOSIS — R9431 Abnormal electrocardiogram [ECG] [EKG]: Secondary | ICD-10-CM | POA: Diagnosis not present

## 2021-02-06 DIAGNOSIS — R0789 Other chest pain: Secondary | ICD-10-CM | POA: Diagnosis not present

## 2021-02-06 DIAGNOSIS — R059 Cough, unspecified: Secondary | ICD-10-CM | POA: Diagnosis not present

## 2021-02-06 DIAGNOSIS — R509 Fever, unspecified: Secondary | ICD-10-CM | POA: Diagnosis not present

## 2021-02-06 DIAGNOSIS — I10 Essential (primary) hypertension: Secondary | ICD-10-CM | POA: Diagnosis not present

## 2021-02-06 DIAGNOSIS — R600 Localized edema: Secondary | ICD-10-CM | POA: Diagnosis not present

## 2021-02-06 DIAGNOSIS — J449 Chronic obstructive pulmonary disease, unspecified: Secondary | ICD-10-CM | POA: Diagnosis not present

## 2021-02-06 DIAGNOSIS — J811 Chronic pulmonary edema: Secondary | ICD-10-CM | POA: Diagnosis not present

## 2021-02-06 DIAGNOSIS — U071 COVID-19: Secondary | ICD-10-CM | POA: Diagnosis not present

## 2021-02-06 DIAGNOSIS — R0602 Shortness of breath: Secondary | ICD-10-CM | POA: Diagnosis not present

## 2021-02-06 DIAGNOSIS — J90 Pleural effusion, not elsewhere classified: Secondary | ICD-10-CM | POA: Diagnosis not present

## 2021-02-06 NOTE — Telephone Encounter (Signed)
Pt called. Unable to reach patient after 3 attempts by [facility] NT, routing to the provider for resolution per protocol.

## 2021-02-06 NOTE — Telephone Encounter (Signed)
Pt called, unable to leave VM d/t mailbox full.   Summary: cold and flu like symptoms   The patient has previously tested positive for COVID 19   The patient is continuing to experience coughing, body aches and soreness, as well as an elevated temperature (99.8), and chills   The patient would like to speak with a member of staff when possible to discuss further

## 2021-02-06 NOTE — Telephone Encounter (Signed)
2nd attempt- Pt called, still unable to leave VM d/t mailbox full. Will attempt again later.

## 2021-02-07 DIAGNOSIS — R0789 Other chest pain: Secondary | ICD-10-CM | POA: Diagnosis not present

## 2021-02-19 ENCOUNTER — Other Ambulatory Visit: Payer: Self-pay | Admitting: Internal Medicine

## 2021-02-19 DIAGNOSIS — I10 Essential (primary) hypertension: Secondary | ICD-10-CM

## 2021-02-19 NOTE — Telephone Encounter (Signed)
Requested Prescriptions  Pending Prescriptions Disp Refills   metoprolol succinate (TOPROL-XL) 25 MG 24 hr tablet [Pharmacy Med Name: METOPROLOL ER SUCCINATE 25MG  TABS] 45 tablet 0    Sig: TAKE 1/2 TABLET(12.5 MG) BY MOUTH DAILY     Cardiovascular:  Beta Blockers Passed - 02/19/2021 10:04 AM      Passed - Last BP in normal range    BP Readings from Last 1 Encounters:  01/01/21 124/68         Passed - Last Heart Rate in normal range    Pulse Readings from Last 1 Encounters:  01/01/21 69         Passed - Valid encounter within last 6 months    Recent Outpatient Visits          3 months ago Major depressive disorder, recurrent, severe w/o psychotic behavior ([place])   [place], DO   10 months ago Major depressive disorder, recurrent, severe w/o psychotic behavior ([place])   [facility] [name], DO   1 year ago [place], FNP   1 year ago Benign hypertension   [place], FNP   1 year ago Leg swelling   [facility], Lupita Raider, FNP      Future Appointments            In 2 days [place], [name], [facility], [place]   In 4 months Agbor-Etang, Aaron Edelman, MD [facility], [place]   In 7 months  [facility], [facility]

## 2021-02-21 ENCOUNTER — Ambulatory Visit (INDEPENDENT_AMBULATORY_CARE_PROVIDER_SITE_OTHER): Payer: Medicaid Other | Admitting: Family Medicine

## 2021-02-21 ENCOUNTER — Encounter: Payer: Self-pay | Admitting: Family Medicine

## 2021-02-21 ENCOUNTER — Other Ambulatory Visit: Payer: Self-pay

## 2021-02-21 VITALS — BP 126/59 | HR 80 | Ht 65.0 in | Wt 164.0 lb

## 2021-02-21 DIAGNOSIS — J209 Acute bronchitis, unspecified: Secondary | ICD-10-CM

## 2021-02-21 DIAGNOSIS — I5032 Chronic diastolic (congestive) heart failure: Secondary | ICD-10-CM | POA: Diagnosis not present

## 2021-02-21 DIAGNOSIS — U099 Post covid-19 condition, unspecified: Secondary | ICD-10-CM

## 2021-02-21 DIAGNOSIS — J440 Chronic obstructive pulmonary disease with acute lower respiratory infection: Secondary | ICD-10-CM

## 2021-02-21 DIAGNOSIS — J129 Viral pneumonia, unspecified: Secondary | ICD-10-CM | POA: Diagnosis not present

## 2021-02-21 MED ORDER — PREDNISONE 20 MG PO TABS: ORAL_TABLET | ORAL | 0 refills | Status: DC

## 2021-02-21 MED ORDER — FUROSEMIDE 20 MG PO TABS: 40.0000 mg | ORAL_TABLET | Freq: Every day | ORAL | 3 refills | Status: DC

## 2021-02-21 MED ORDER — POTASSIUM CHLORIDE ER 10 MEQ PO TBCR: 10.0000 meq | EXTENDED_RELEASE_TABLET | Freq: Every day | ORAL | 3 refills | Status: DC

## 2021-02-21 NOTE — Patient Instructions (Addendum)
Thank you for coming to the office today.  Go ahead and increase dose Lasix (furosemide) from 1 pill daily now take TWO pills at same time daily in morning = 40mg  dose, some days you may take only ONE pill if not as bad swelling.  If breathing coughing wheezing does not improve within 3 days of double fluid pill - go ahead and take 1 week taper steroid.  May take a few more weeks to improve breathing.  Cardiology referred you to the Vascular doctors for the swelling.  Call to schedule  [facility], [place], [place] [address]  Main: [phone]   If not improved next we can consider Chest X-ray  Please schedule a Follow-up Appointment to: Return if symptoms worsen or fail to improve.  If you have any other questions or concerns, please feel free to call the office or send a message through [place]. You may also schedule an earlier appointment if necessary.  Additionally, you may be receiving a survey about your experience at our office within a few days to 1 week by e-mail or mail. We value your feedback.  Nobie Putnam, DO [place]

## 2021-02-21 NOTE — Progress Notes (Signed)
Subjective:    Patient ID: Beth [name], female    DOB: 1945-08-26, 76 y.o.   MRN: [id number]  Beth [name] is a 76 y.o. female presenting on 02/21/2021 for Hospitalization Follow-up and Shortness of Breath   HPI  ED FOLLOW-UP VISIT  Hospital/Location: [facility] ED Date of ED Visit: 02/06/21  Reason for Presenting to ED: Viral Pneumonia / Cough Fever post [place]  - ED provider note and record have been reviewed - Patient presents today about 15 days after recent ED visit. Brief summary of recent course, patient had symptoms of COVID19 previously then did better then worse again with URI symptoms fever chills cough body ache and dyspnea, presented to ED, testing in ED with CXR with some mild pleural effusions and EKG was done labs and covid viral testing POSITIVE but no further treatment since prior positive, treated with adjusted lasix dose 83m x 2 for 2 days then Zpak prednisone.  - Today reports overall has done well after discharge from ED. About 75% improved overall. Symptoms of dyspnea still persistent with longer distance walking only, short distance is okay. Edema had improved on higher dose lasix but returned with lower dose. Needs new order. Her last Cards apt 12/2020 they referred her to Vascular for swelling. She has known HFpEF   - New medications on discharge: double dose furosemide 2 days and zpak prednisone   I have reviewed the discharge medication list, and have reconciled the current and discharge medications today.   Current Outpatient Medications:    acetaminophen (TYLENOL) 500 MG tablet, Take 500 mg by mouth 2 (two) times daily as needed., Disp: , Rfl:    albuterol (VENTOLIN HFA) 108 (90 Base) MCG/ACT inhaler, Inhale into the lungs every 6 (six) hours as needed for wheezing or shortness of breath., Disp: , Rfl:    aspirin 81 MG chewable tablet, Chew 81 mg daily by mouth., Disp: , Rfl:    atorvastatin (LIPITOR) 40 MG tablet, TAKE 1  TABLET BY MOUTH EVERY DAY, Disp: 90 tablet, Rfl: 3   clonazePAM (KLONOPIN) 0.5 MG tablet, Take 1 tablet (0.579m every other day as needed for anxiety., Disp: 15 tablet, Rfl: 2   DULoxetine (CYMBALTA) 20 MG capsule, Take 1 capsule (20 mg total) by mouth daily., Disp: 90 capsule, Rfl: 3   FLUoxetine (PROZAC) 20 MG capsule, Take 3 capsules (60 mg total) by mouth daily., Disp: 270 capsule, Rfl: 3   gabapentin (NEURONTIN) 300 MG capsule, Take 2 capsules (600 mg total) by mouth 3 (three) times daily., Disp: 180 capsule, Rfl: 5   HYDROcodone-acetaminophen (NORCO/VICODIN) 5-325 MG tablet, Take 1-2 tablets by mouth daily as needed for moderate pain., Disp: 30 tablet, Rfl: 0   hydrOXYzine (ATARAX/VISTARIL) 25 MG tablet, Take 25-50 mg by mouth daily as needed., Disp: , Rfl:    Melatonin 3 MG CAPS, Take 10 mg by mouth at bedtime as needed (sleep). , Disp: , Rfl:    metoprolol succinate (TOPROL-XL) 25 MG 24 hr tablet, TAKE 1/2 TABLET(12.5 MG) BY MOUTH DAILY, Disp: 45 tablet, Rfl: 0   montelukast (SINGULAIR) 10 MG tablet, TAKE 1 TABLET(10 MG) BY MOUTH AT BEDTIME, Disp: 90 tablet, Rfl: 1   multivitamin-iron-minerals-folic acid (CENTRUM) chewable tablet, Chew 1 tablet by mouth daily., Disp: , Rfl:    predniSONE (DELTASONE) 20 MG tablet, Take daily with food. Start with 6069m3 pills) x 2 days, then reduce to 42m33m pills) x 2 days, then 20mg10mpill) x 3 days,  Disp: 13 tablet, Rfl: 0   QUEtiapine (SEROQUEL) 100 MG tablet, Take 1 tablet (100 mg total) by mouth at bedtime., Disp: 90 tablet, Rfl: 3   TRELEGY ELLIPTA 200-62.5-25 MCG/INH AEPB, INHALE 1 PUFF INTO THE LUNGS DAILY, Disp: 60 each, Rfl: 5   furosemide (LASIX) 20 MG tablet, Take 2 tablets (40 mg total) by mouth daily. If swelling is less, can take only 1 pill., Disp: 180 tablet, Rfl: 3   potassium chloride (KLOR-CON) 10 MEQ tablet, Take 1 tablet (10 mEq total) by mouth daily., Disp: 90 tablet, Rfl:  3  ------------------------------------------------------------------------- Social History   Tobacco Use   Smoking status: Former    Packs/day: 0.25    Years: 57.00    Pack years: 14.25    Types: Cigarettes    Quit date: 10/24/2020    Years since quitting: 0.3   Smokeless tobacco: Never   Tobacco comments:    6-8cig daily--08/28/2020  Vaping Use   Vaping Use: Never used  Substance Use Topics   Alcohol use: No    Alcohol/week: 0.0 standard drinks   Drug use: No    Review of Systems Per HPI unless specifically indicated above     Objective:    BP (!) 126/59    Pulse 80    Ht '5\' 5"'  (1.651 m)    Wt 164 lb (74.4 kg)    SpO2 95%    BMI 27.29 kg/m   Wt Readings from Last 3 Encounters:  02/21/21 164 lb (74.4 kg)  01/01/21 159 lb 12.8 oz (72.5 kg)  11/17/20 157 lb (71.2 kg)    Physical Exam Vitals and nursing note reviewed.  Constitutional:      General: She is not in acute distress.    Appearance: She is well-developed. She is not diaphoretic.     Comments: Well-appearing, comfortable, cooperative  HENT:     Head: Normocephalic and atraumatic.  Eyes:     General:        Right eye: No discharge.        Left eye: No discharge.     Conjunctiva/sclera: Conjunctivae normal.  Neck:     Thyroid: No thyromegaly.  Cardiovascular:     Rate and Rhythm: Normal rate and regular rhythm.     Heart sounds: Normal heart sounds. No murmur heard. Pulmonary:     Effort: Pulmonary effort is normal. No respiratory distress.     Breath sounds: Examination of the right-upper field reveals wheezing. Examination of the left-upper field reveals wheezing. Examination of the right-middle field reveals wheezing. Examination of the left-middle field reveals wheezing. Examination of the right-lower field reveals wheezing. Examination of the left-lower field reveals wheezing. Wheezing and rhonchi present. No rales.  Musculoskeletal:        General: Normal range of motion.     Cervical back: Normal  range of motion and neck supple.     Right lower leg: Edema present.     Left lower leg: Edema present.  Lymphadenopathy:     Cervical: No cervical adenopathy.  Skin:    General: Skin is warm and dry.     Findings: No erythema or rash.  Neurological:     Mental Status: She is alert and oriented to person, place, and time.  Psychiatric:        Behavior: Behavior normal.     Comments: Well groomed, good eye contact, normal speech and thoughts    I have personally reviewed the radiology report from 02/06/21 on CXR.  XR  Chest 2 views  Anatomical Region Laterality Modality  Chest right Computed Radiography   Impression   Mild interstitial edema with trace bilateral effusions. Narrative  EXAM: XR CHEST 2 VIEWS  DATE: 02/06/2021 12:57 PM  ACCESSION: [id number]  DICTATED: 02/06/2021 1:00 PM  INTERPRETATION LOCATION: [place]   CLINICAL INDICATION: 76 years old Female with CHEST PAIN ; Chest Pain     COMPARISON: None   TECHNIQUE: PA and Lateral Chest Radiographs.   FINDINGS:   No consolidation or infiltrate. Mild prominence of interstitial lung markings. Incompletely visualized ACDF hardware..   Trace bilateral pleural effusions. No pneumothorax.   Unremarkable cardiomediastinal silhouette.   Results for orders placed or performed in visit on 01/74/94  Basic metabolic panel  Result Value Ref Range   Glucose 111 (H) 70 - 99 mg/dL   BUN 19 8 - 27 mg/dL   Creatinine, Ser 1.10 (H) 0.57 - 1.00 mg/dL   eGFR 52 (L) >59 mL/min/1.73   BUN/Creatinine Ratio 17 12 - 28   Sodium 143 134 - 144 mmol/L   Potassium 3.6 3.5 - 5.2 mmol/L   Chloride 102 96 - 106 mmol/L   CO2 29 20 - 29 mmol/L   Calcium 9.3 8.7 - 10.3 mg/dL      Assessment & Plan:   Problem List Items Addressed This Visit     Chronic diastolic congestive heart failure (HCC) - Primary   Relevant Medications   furosemide (LASIX) 20 MG tablet   potassium chloride (KLOR-CON) 10 MEQ tablet   Other Visit  Diagnoses     Post-COVID syndrome       Relevant Medications   predniSONE (DELTASONE) 20 MG tablet   Viral pneumonia       Relevant Medications   predniSONE (DELTASONE) 20 MG tablet   Acute bronchitis with COPD (HCC)       Relevant Medications   predniSONE (DELTASONE) 20 MG tablet      Improved post covid syndrome overall Residual dyspnea on exertion / cough wheezing and edema Last CXR mild pleural effusions already on diuretic. Hold on repeat x-ray immediately only 2 weeks later and improving.  Keep following w/ Cardiology as planned. And upcoming vascular.   Go ahead and increase dose Lasix (furosemide) from 1 pill daily now take TWO pills at same time daily in morning = 55m dose, some days you may take only ONE pill if not as bad swelling.  If breathing coughing wheezing does not improve within 3 days of double fluid pill - go ahead and take 1 week taper steroid.  May take a few more weeks to improve breathing.  Cardiology referred you to the Vascular doctors for the swelling.  She can call to schedule  [facility] [address] [place] [id number] Main: [phone]   If not improved next we can consider Chest X-ray   Meds ordered this encounter  Medications   furosemide (LASIX) 20 MG tablet    Sig: Take 2 tablets (40 mg total) by mouth daily. If swelling is less, can take only 1 pill.    Dispense:  180 tablet    Refill:  3   potassium chloride (KLOR-CON) 10 MEQ tablet    Sig: Take 1 tablet (10 mEq total) by mouth daily.    Dispense:  90 tablet    Refill:  3   predniSONE (DELTASONE) 20 MG tablet    Sig: Take daily with food. Start with 668m(3 pills) x 2  days, then reduce to 26m (2 pills) x 2 days, then 238m(1 pill) x 3 days    Dispense:  13 tablet    Refill:  0    Follow up plan: Return if symptoms worsen or fail to improve.   [facility] 02/21/2021, 2:35 PM

## 2021-02-22 ENCOUNTER — Telehealth: Payer: Medicare Other

## 2021-02-22 ENCOUNTER — Telehealth: Payer: Self-pay

## 2021-02-22 NOTE — Telephone Encounter (Signed)
°  Care Management   Follow Up Note   02/22/2021 Name: Beth [name] MRN: [id number] DOB: 08-17-1945   Referred by: [name], DO Reason for referral : Chronic Care Management (RNCM: Follow up for Chronic Disease Management and Care Coordination Needs )   An unsuccessful telephone outreach was attempted today. The patient was referred to the case management team for assistance with care management and care coordination.   Follow Up Plan: A HIPPA compliant phone message was left for the patient providing contact information and requesting a return call.   [name] RN, MSN, [facility] Mobile: [phone]

## 2021-02-26 DIAGNOSIS — J441 Chronic obstructive pulmonary disease with (acute) exacerbation: Secondary | ICD-10-CM | POA: Diagnosis not present

## 2021-02-27 ENCOUNTER — Other Ambulatory Visit: Payer: Self-pay | Admitting: *Deleted

## 2021-02-27 DIAGNOSIS — J449 Chronic obstructive pulmonary disease, unspecified: Secondary | ICD-10-CM

## 2021-02-27 DIAGNOSIS — F17210 Nicotine dependence, cigarettes, uncomplicated: Secondary | ICD-10-CM

## 2021-02-27 DIAGNOSIS — Z87891 Personal history of nicotine dependence: Secondary | ICD-10-CM

## 2021-02-27 DIAGNOSIS — I5032 Chronic diastolic (congestive) heart failure: Secondary | ICD-10-CM

## 2021-03-05 ENCOUNTER — Ambulatory Visit (INDEPENDENT_AMBULATORY_CARE_PROVIDER_SITE_OTHER): Payer: Medicare Other

## 2021-03-05 ENCOUNTER — Telehealth: Payer: Medicare Other

## 2021-03-05 DIAGNOSIS — F411 Generalized anxiety disorder: Secondary | ICD-10-CM

## 2021-03-05 DIAGNOSIS — I5032 Chronic diastolic (congestive) heart failure: Secondary | ICD-10-CM

## 2021-03-05 DIAGNOSIS — F332 Major depressive disorder, recurrent severe without psychotic features: Secondary | ICD-10-CM

## 2021-03-05 DIAGNOSIS — J449 Chronic obstructive pulmonary disease, unspecified: Secondary | ICD-10-CM

## 2021-03-05 DIAGNOSIS — G8929 Other chronic pain: Secondary | ICD-10-CM

## 2021-03-05 DIAGNOSIS — U099 Post covid-19 condition, unspecified: Secondary | ICD-10-CM

## 2021-03-05 DIAGNOSIS — E785 Hyperlipidemia, unspecified: Secondary | ICD-10-CM

## 2021-03-05 DIAGNOSIS — I10 Essential (primary) hypertension: Secondary | ICD-10-CM

## 2021-03-05 DIAGNOSIS — F339 Major depressive disorder, recurrent, unspecified: Secondary | ICD-10-CM

## 2021-03-05 DIAGNOSIS — M545 Low back pain, unspecified: Secondary | ICD-10-CM

## 2021-03-05 NOTE — Chronic Care Management (AMB) (Signed)
Chronic Care Management   CCM RN Visit Note  03/05/2021 Name: Beth [name] MRN: [id number] DOB: [birth year]  Subjective: Beth [name] is a 76 y.o. year old female who is a primary care patient of [name], DO. The care management team was consulted for assistance with disease management and care coordination needs.    Engaged with patient by telephone for follow up visit in response to provider referral for case management and/or care coordination services.   Consent to Services:  The patient was given information about Chronic Care Management services, agreed to services, and gave verbal consent prior to initiation of services.  Please see initial visit note for detailed documentation.   Patient agreed to services and verbal consent obtained.   Assessment: Review of patient past medical history, allergies, medications, health status, including review of consultants reports, laboratory and other test data, was performed as part of comprehensive evaluation and provision of chronic care management services.   SDOH (Social Determinants of Health) assessments and interventions performed:  SDOH Interventions    Flowsheet Row Most Recent Value  SDOH Interventions   Food Insecurity Interventions Intervention Not Indicated  Financial Strain Interventions Other (Comment)  [patients daughter helps with paying her bills]  Housing Interventions Intervention Not Indicated  Intimate Partner Violence Interventions Intervention Not Indicated  Physical Activity Interventions Other (Comments)  [exercises every night about 20 minutes]  Stress Interventions Other (Comment)  [gets nervous, but practices deep breathing, spend time with dog]  Social Connections Interventions Other (Comment)  [has a good support system with family and friends]  Transportation Interventions Intervention Not Indicated        CCM Care Plan  Allergies  Allergen Reactions   Bextra  [Valdecoxib]     Compazine [Prochlorperazine Edisylate]     Stroke-like symptoms   Lithium Carbonate     Leg weakness   Lyrica [Pregabalin]     Outpatient Encounter Medications as of 03/05/2021  Medication Sig   acetaminophen (TYLENOL) 500 MG tablet Take 500 mg by mouth 2 (two) times daily as needed.   albuterol (VENTOLIN HFA) 108 (90 Base) MCG/ACT inhaler Inhale into the lungs every 6 (six) hours as needed for wheezing or shortness of breath.   aspirin 81 MG chewable tablet Chew 81 mg daily by mouth.   atorvastatin (LIPITOR) 40 MG tablet TAKE 1 TABLET BY MOUTH EVERY DAY   clonazePAM (KLONOPIN) 0.5 MG tablet Take 1 tablet (0.51m) every other day as needed for anxiety.   DULoxetine (CYMBALTA) 20 MG capsule Take 1 capsule (20 mg total) by mouth daily.   FLUoxetine (PROZAC) 20 MG capsule Take 3 capsules (60 mg total) by mouth daily.   furosemide (LASIX) 20 MG tablet Take 2 tablets (40 mg total) by mouth daily. If swelling is less, can take only 1 pill.   gabapentin (NEURONTIN) 300 MG capsule Take 2 capsules (600 mg total) by mouth 3 (three) times daily.   HYDROcodone-acetaminophen (NORCO/VICODIN) 5-325 MG tablet Take 1-2 tablets by mouth daily as needed for moderate pain.   hydrOXYzine (ATARAX/VISTARIL) 25 MG tablet Take 25-50 mg by mouth daily as needed.   Melatonin 3 MG CAPS Take 10 mg by mouth at bedtime as needed (sleep).    metoprolol succinate (TOPROL-XL) 25 MG 24 hr tablet TAKE 1/2 TABLET(12.5 MG) BY MOUTH DAILY   montelukast (SINGULAIR) 10 MG tablet TAKE 1 TABLET(10 MG) BY MOUTH AT BEDTIME   multivitamin-iron-minerals-folic acid (CENTRUM) chewable tablet Chew 1 tablet by mouth daily.   potassium  chloride (KLOR-CON) 10 MEQ tablet Take 1 tablet (10 mEq total) by mouth daily.   predniSONE (DELTASONE) 20 MG tablet Take daily with food. Start with 56m (3 pills) x 2 days, then reduce to 430m(2 pills) x 2 days, then 2086m1 pill) x 3 days   QUEtiapine (SEROQUEL) 100 MG tablet Take 1 tablet (100 mg total) by  mouth at bedtime.   TREBendena0-62.5-25 MCG/INH AEPB INHALE 1 PUFF INTO THE LUNGS DAILY   No facility-administered encounter medications on file as of 03/05/2021.    Patient Active Problem List   Diagnosis Date Noted   Chronic diastolic congestive heart failure (HCC[place]wood0/18/2022   Rash 01/14/2020   Pulmonary hypertension (HCC[place] Lakes8/17/2021   Coccydynia 07/29/2019   Cervicalgia 05/04/2019   Acute pain of left shoulder 05/04/2019   Elbow pain, left 05/04/2019   DOE (dyspnea on exertion) 03/23/2019   Leg swelling 03/22/2019    Class: Acute   Lumbar degenerative disc disease 09/10/2018   Left hip pain 09/10/2018   Post-traumatic osteoarthritis of left elbow 07/14/2018   Lumbar radiculopathy 02/12/2018   Lumbar spondylosis 02/12/2018   Lumbar facet arthropathy 02/12/2018   Atherosclerosis of aorta (HCCEncinal8/21/2019   Lung nodule 09/17/2017   Urinary tract infection 12/05/2016   Protein-calorie malnutrition, mild (HCC) 08/07/2016   Generalized anxiety disorder 07/15/2016   Chronic pain syndrome 07/15/2016   Severe episode of recurrent major depressive disorder, without psychotic features (HCCC[place]6/17/2018   Moderate benzodiazepine use disorder (HCCPal[place]4/11/2016   Major depressive disorder, recurrent, severe w/o psychotic behavior (HCC[place]ling [place]4/04/2016   Seasonal allergic rhinitis 03/23/2015   Hyperglycemia 03/23/2015   Senile purpura (HCCClarks[place]2/23/2017   Perennial allergic rhinitis with seasonal variation 03/23/2015   Marital problems 10/31/2014   Migraine without aura and without status migrainosus, not intractable 10/31/2014   Myofascial pain syndrome of lumbar spine 08/09/2014   Colon polyp 08/09/2014   COPD, severe (HCCWhitewood7/12/2014   CVA, old, hemiparesis (HCCKeith[place]7/12/2014   Dyslipidemia 08/09/2014   Dysfunction of eustachian tube 08/09/2014   Fibromyalgia syndrome 08/09/2014   Gastro-esophageal reflux disease without esophagitis 08/09/2014   Benign migrating glossitis 08/09/2014    Cerebrovascular accident, old 08/09/2014   IBS (irritable bowel syndrome) 08/09/2014   Low back pain with radiation 08/09/2014   Chronic recurrent major depressive disorder (HCCSwartz7/63/33/5456Dysmetabolic syndrome 08/21/61/8937OP (osteoporosis) 08/09/2014   Vitamin D deficiency 08/09/2014   Smoking 08/09/2014   Benign hypertension 07/19/2013   Benign neoplasm of skin of trunk 06/03/2013   H/O: pneumonia 09/25/2012    Conditions to be addressed/monitored:CHF, HTN, HLD, COPD, Anxiety, Depression, and Chronic pain   Care Plan : RNCM: General Plan of Care (Adult) for Chronic Disease Management and Care Coordination Needs  Updates made by TatVanita InglesN since 03/05/2021 12:00 AM     Problem: RNCM: Development of Plan of Care For Chronic Disease Management (HTN, HLD, HF, COPD, Depression, Anxiety, Chronic Pain)   Priority: High     Long-Range Goal: RNCM: Effective Management  of Plan of Care For Chronic Disease Management (HTN, HLD, HF, COPD, Depression, Anxiety, Chronic Pain)   Start Date: 03/05/2021  Expected End Date: 03/05/2022  Priority: High  Note:   Current Barriers:  Knowledge Deficits related to plan of care for management of CHF, HTN, HLD, COPD, Chronic Pain, and Depression and Axiety  Chronic Disease Management support and education needs related to CHF, HTN, HLD, COPD, Chronic Pain, and depression and anxiety Lacks caregiver  support.         RNCM Clinical Goal(s):  Patient will verbalize basic understanding of CHF, HTN, HLD, COPD, Anxiety, Depression, and Chronic Pain disease process and self health management plan as evidenced by keeping appointments, following the plan of care as prescribed by the providers, and working with the CCM team to optimize health and well being take all medications exactly as prescribed and will call provider for medication related questions as evidenced by compliance with medications and calling for refills before running out of medications     attend all scheduled medical appointments: saw pcp in January.  Knows to call for changes or needs  as evidenced by keeping appointments and calling for schedule change needs        demonstrate improved and ongoing adherence to prescribed treatment plan for CHF, HTN, HLD, COPD, Anxiety, Depression, and chronic pain  as evidenced by stable conditions, no exacerbations of CHF or COPD, mental health conditions stable, VS stable, routine MD visits and lab work and working with the providers and CCM team to optimize health and well being continue to work with Consulting civil engineer and/or Education officer, museum to address care management and care coordination needs related to CHF, HTN, HLD, COPD, Anxiety, Depression, and chronic pain  as evidenced by adherence to CM Team Scheduled appointments     work with pharmacist to address medication reconciliation and needs related to CHF, HTN, HLD, COPD, Anxiety, Depression, and chronic pain as evidenced by review of EMR and patient or pharmacist report    work with Education officer, museum to address [place] Concerns  related to the management of Anxiety and Depression as evidenced by review of EMR and patient or social worker report     demonstrate ongoing self health care management ability effective management of chronic conditions as evidenced by working with the CCM team through collaboration with Consulting civil engineer, provider, and care team.   Interventions: 1:1 collaboration with primary care provider regarding development and update of comprehensive plan of care as evidenced by provider attestation and co-signature Inter-disciplinary care team collaboration (see longitudinal plan of care) Evaluation of current treatment plan related to  self management and patient's adherence to plan as established by provider   Heart Failure Interventions:  (Status: Goal on Track (progressing): YES.)  Long Term Goal  Wt Readings from Last 3 Encounters:  02/21/21 164 lb (74.4 kg)  01/01/21 159  lb 12.8 oz (72.5 kg)  11/17/20 157 lb (71.2 kg)   Basic overview and discussion of pathophysiology of Heart Failure reviewed. 03-05-2021: The patient has fluctuations in weights at times. Has been instructed to take 2 of the Lasix currently for additional fluid on board. Was in the ER in January for chest tightness. The patient states she was COVID positive and that was what was going on. Education and support provided.  Provided education on low sodium diet Reviewed Heart Failure Action Plan in depth and provided written copy Assessed need for readable accurate scales in home Provided education about placing scale on hard, flat surface Advised patient to weigh each morning after emptying bladder Discussed importance of daily weight and advised patient to weigh and record daily Reviewed role of diuretics in prevention of fluid overload and management of heart failure Discussed the importance of keeping all appointments with provider. Saw cardiologist in December 2022 and pcp on 02-21-2021. The patient aware of calling for changes in CHF or other chronic conditions Provided patient with education about the role of exercise  in the management of heart failure Advised patient to discuss changes in weight, plus 2-3 in one day or 5 in one week, concerns for management of HF, complex conditions related to HF and COPD exacerbations at times with provider Screening for signs and symptoms of depression related to chronic disease state  Assessed social determinant of health barriers  COPD: (Status: Goal on Track (progressing): YES.) Long Term Goal  Reviewed medications with patient, including use of prescribed maintenance and rescue inhalers, and provided instruction on medication management and the importance of adherence. 03-05-2021: The patient is compliant with medications. The patient states that she had COVID and finished her last prednisone today. The patient is monitoring for changes in conditions.   Provided patient with basic written and verbal COPD education on self care/management/and exacerbation prevention. 03-05-2021: Review of being safe and not going around others who are sick, being mindful of handwashing, and changes in weather that could cause exacerbation in conditions. The patient states she was COVID positive in January and got better but then got worse again. She states that she is feeling much better. Review of what worse looks like. Advised patient to track and manage COPD triggers. 03-05-2021: Review of triggers and discussed the patient monitoring for changes.  Provided written and verbal instructions on pursed lip breathing and utilized returned demonstration as teach back Provided instruction about proper use of medications used for management of COPD including inhalers Advised patient to self assesses COPD action plan zone and make appointment with provider if in the yellow zone for 48 hours without improvement Advised patient to engage in light exercise as tolerated 3-5 days a week to aid in the the management of COPD Provided education about and advised patient to utilize infection prevention strategies to reduce risk of respiratory infection Discussed the importance of adequate rest and management of fatigue with COPD  Anxiety and Depression  (Status: Goal on Track (progressing): YES.) Long Term Goal  Evaluation of current treatment plan related to Anxiety and Depression, Mental Health Concerns  and the need for a new mental health provider  self-management and patient's adherence to plan as established by provider. Discussed plans with patient for ongoing care management follow up and provided patient with direct contact information for care management team Advised patient to call the office for changes in mood, anxiety, depression, or worsening mental health conditions; Provided education to patient re: continuing to reach out to the resources provided to her for mental  health services. She has been unable to secure a new mental health provider; Reviewed medications with patient and discussed compliance. 03-05-2021: The patient states that she is compliant with her medications but [employer] called her last week and told her she needed to talk to her doctor about prescribing something not as "dangerous as Klonopin". The patient has not called the office to discuss this. Will collaborate with the pcp and CCM team for recommendations. The patient has been unable to secure a new mental health provider in the area; Collaborated with pcp, pharm  D, and LCSW regarding mental health provider concerns and the patient expressing that her insurance company called her last week and ask her to discuss with her provider about an alternative medication to "klonopin" because it was "too dangerous for her to take". Education and support given. The RNCM will reach out to the team for recommendations and support. The patient has her medications and is taking as directed. The patient denies any acute distress or changes in her mental  health at this time; Provided patient with mindfulness, deep breathing exercises, and medication educational materials related to effective management of depression and anxiety.  The patient states that when she is "nervous" she finds comfort in deep breathing, resting, and spending time with her little dog: "Peggy"; Reviewed scheduled/upcoming provider appointments including no new appointments at this time. The patient may need an appointment to come in to talk to pcp about mental health needs ; Social Work referral for ongoing support and education for mental health needs. The patient has worked with the LCSW in the past and also has an upcoming appointment for 03-20-2021 at 10 am with LCSW; Discussed plans with patient for ongoing care management follow up and provided patient with direct contact information for care management team; Advised patient to discuss mental  health concerns and medications  with provider; Screening for signs and symptoms of depression related to chronic disease state;  Assessed social determinant of health barriers;   Hyperlipidemia:  (Status: Goal on Track (progressing): YES.) Long Term Goal  Lab Results  Component Value Date   CHOL 135 04/10/2018   HDL 59 04/10/2018   LDLCALC 64 04/10/2018   TRIG 50 04/10/2018   CHOLHDL 2.3 04/10/2018     Medication review performed; medication list updated in electronic medical record.  Provider established cholesterol goals reviewed; Counseled on importance of regular laboratory monitoring as prescribed; Provided HLD educational materials; Reviewed role and benefits of statin for ASCVD risk reduction; Discussed strategies to manage statin-induced myalgias; Reviewed importance of limiting foods high in cholesterol;  Hypertension: (Status: Goal on Track (progressing): YES.) Long Term Goal  Last practice recorded BP readings:  BP Readings from Last 3 Encounters:  02/21/21 (!) 126/59  01/01/21 124/68  11/17/20 130/70  Most recent eGFR/CrCl:  Lab Results  Component Value Date   EGFR 52 (L) 01/01/2021    No components found for: CRCL  Evaluation of current treatment plan related to hypertension self management and patient's adherence to plan as established by provider;   Provided education to patient re: stroke prevention, s/s of heart attack and stroke; Reviewed prescribed diet heart healthy Reviewed medications with patient and discussed importance of compliance;  Discussed plans with patient for ongoing care management follow up and provided patient with direct contact information for care management team; Advised patient, providing education and rationale, to monitor blood pressure daily and record, calling PCP for findings outside established parameters;  Advised patient to discuss changes in blood pressures or heart health with provider; Provided education on prescribed diet  heart healthy/ADA diet ;  Discussed complications of poorly controlled blood pressure such as heart disease, stroke, circulatory complications, vision complications, kidney impairment, sexual dysfunction;   Pain:  (Status: Goal on Track (progressing): YES.) Long Term Goal  Pain assessment performed. 03-05-2021: The patient denies any pain at this time. The patient states her pain is stable Medications reviewed. 03-05-2021: The patient takes medications as prescribed Reviewed provider established plan for pain management; Discussed importance of adherence to all scheduled medical appointments; Counseled on the importance of reporting any/all new or changed pain symptoms or management strategies to pain management provider; Advised patient to report to care team affect of pain on daily activities; Discussed use of relaxation techniques and/or diversional activities to assist with pain reduction (distraction, imagery, relaxation, massage, acupressure, TENS, heat, and cold application; Reviewed with patient prescribed pharmacological and nonpharmacological pain relief strategies; Advised patient to discuss unresolved pain, changes in level or intensity of pain with provider;  Patient Goals/Self-Care Activities: Take medications as prescribed   Attend all scheduled provider appointments Call pharmacy for medication refills 3-7 days in advance of running out of medications Perform all self care activities independently  Perform IADL's (shopping, preparing meals, housekeeping, managing finances) independently Call provider office for new concerns or questions  Work with the social worker to address care coordination needs and will continue to work with the clinical team to address health care and disease management related needs call the Suicide and Crisis Lifeline: 988 call the [place] National Suicide Prevention Lifeline: (929) 545-2301 or TTY: 248-495-1900 TTY 786-292-7975) to talk to a trained  counselor call 1-800-273-TALK (toll free, 24 hour hotline) if experiencing a Mental Health or [place]  call office if I gain more than 2 pounds in one day or 5 pounds in one week keep legs up while sitting track weight in diary use salt in moderation watch for swelling in feet, ankles and legs every day weigh myself daily begin a heart failure diary bring diary to all appointments develop a rescue plan follow rescue plan if symptoms flare-up eat more whole grains, fruits and vegetables, lean meats and healthy fats know when to call the doctorfor changes in HF, increased swelling in feet, legs, abdomen, and sx and sx of dehydration  track symptoms and what helps feel better or worse dress right for the weather, hot or cold - avoid second hand smoke - eliminate smoking in my home - identify and avoid work-related triggers - identify and remove indoor air pollutants - limit outdoor activity during cold weather - listen for public air quality announcements every day - do breathing exercises every day - arrange respite care for caregiver - attend pulmonary rehabilitation - begin a symptom diary - develop a rescue plan - eliminate symptom triggers at home - follow rescue plan if symptoms flare-up - use an extra pillow to sleep - develop a new routine to improve sleep - don't eat or exercise right before bedtime - eat healthy/prescribed diet: heart healthy - get at least 7 to 8 hours of sleep at night - use devices that will help like a cane, sock-puller or reacher - practice relaxation or meditation daily - do exercises in a comfortable position that makes breathing as easy as possible check blood pressure weekly choose a place to take my blood pressure (home, clinic or office, retail store) write blood pressure results in a log or diary learn about high blood pressure keep a blood pressure log take blood pressure log to all doctor appointments call doctor for  signs and symptoms of high blood pressure develop an action plan for high blood pressure keep all doctor appointments take medications for blood pressure exactly as prescribed report new symptoms to your doctor eat more whole grains, fruits and vegetables, lean meats and healthy fats - call for medicine refill 2 or 3 days before it runs out - take all medications exactly as prescribed - call doctor with any symptoms you believe are related to your medicine - call doctor when you experience any new symptoms - go to all doctor appointments as scheduled - adhere to prescribed diet: heart healthy       Plan:Telephone follow up appointment with care management team member scheduled for:  04-23-2021 at 1145 am  [name] RN, MSN, [place] Mobile: [phone]

## 2021-03-05 NOTE — Patient Instructions (Signed)
Visit Information  Thank you for taking time to visit with me today. Please don't hesitate to contact me if I can be of assistance to you before our next scheduled telephone appointment.  Following are the goals we discussed today:  RNCM Clinical Goal(s):  Patient will verbalize basic understanding of CHF, HTN, HLD, COPD, Anxiety, Depression, and Chronic Pain disease process and self health management plan as evidenced by keeping appointments, following the plan of care as prescribed by the providers, and working with the CCM team to optimize health and well being take all medications exactly as prescribed and will call provider for medication related questions as evidenced by compliance with medications and calling for refills before running out of medications    attend all scheduled medical appointments: saw pcp in January.  Knows to call for changes or needs  as evidenced by keeping appointments and calling for schedule change needs        demonstrate improved and ongoing adherence to prescribed treatment plan for CHF, HTN, HLD, COPD, Anxiety, Depression, and chronic pain  as evidenced by stable conditions, no exacerbations of CHF or COPD, mental health conditions stable, VS stable, routine MD visits and lab work and working with the providers and CCM team to optimize health and well being continue to work with Consulting civil engineer and/or Education officer, museum to address care management and care coordination needs related to CHF, HTN, HLD, COPD, Anxiety, Depression, and chronic pain  as evidenced by adherence to CM Team Scheduled appointments     work with pharmacist to address medication reconciliation and needs related to CHF, HTN, HLD, COPD, Anxiety, Depression, and chronic pain as evidenced by review of EMR and patient or pharmacist report    work with Education officer, museum to address [place] Concerns  related to the management of Anxiety and Depression as evidenced by review of EMR and patient or social worker  report     demonstrate ongoing self health care management ability effective management of chronic conditions as evidenced by working with the CCM team through collaboration with Consulting civil engineer, provider, and care team.    Interventions: 1:1 collaboration with primary care provider regarding development and update of comprehensive plan of care as evidenced by provider attestation and co-signature Inter-disciplinary care team collaboration (see longitudinal plan of care) Evaluation of current treatment plan related to  self management and patient's adherence to plan as established by provider     Heart Failure Interventions:  (Status: Goal on Track (progressing): YES.)  Long Term Goal     Wt Readings from Last 3 Encounters:  02/21/21 164 lb (74.4 kg)  01/01/21 159 lb 12.8 oz (72.5 kg)  11/17/20 157 lb (71.2 kg)    Basic overview and discussion of pathophysiology of Heart Failure reviewed. 03-05-2021: The patient has fluctuations in weights at times. Has been instructed to take 2 of the Lasix currently for additional fluid on board. Was in the ER in January for chest tightness. The patient states she was COVID positive and that was what was going on. Education and support provided.  Provided education on low sodium diet Reviewed Heart Failure Action Plan in depth and provided written copy Assessed need for readable accurate scales in home Provided education about placing scale on hard, flat surface Advised patient to weigh each morning after emptying bladder Discussed importance of daily weight and advised patient to weigh and record daily Reviewed role of diuretics in prevention of fluid overload and management of heart failure Discussed  the importance of keeping all appointments with provider. Saw cardiologist in December 2022 and pcp on 02-21-2021. The patient aware of calling for changes in CHF or other chronic conditions Provided patient with education about the role of exercise in the  management of heart failure Advised patient to discuss changes in weight, plus 2-3 in one day or 5 in one week, concerns for management of HF, complex conditions related to HF and COPD exacerbations at times with provider Screening for signs and symptoms of depression related to chronic disease state  Assessed social determinant of health barriers   COPD: (Status: Goal on Track (progressing): YES.) Long Term Goal  Reviewed medications with patient, including use of prescribed maintenance and rescue inhalers, and provided instruction on medication management and the importance of adherence. 03-05-2021: The patient is compliant with medications. The patient states that she had COVID and finished her last prednisone today. The patient is monitoring for changes in conditions.  Provided patient with basic written and verbal COPD education on self care/management/and exacerbation prevention. 03-05-2021: Review of being safe and not going around others who are sick, being mindful of handwashing, and changes in weather that could cause exacerbation in conditions. The patient states she was COVID positive in January and got better but then got worse again. She states that she is feeling much better. Review of what worse looks like. Advised patient to track and manage COPD triggers. 03-05-2021: Review of triggers and discussed the patient monitoring for changes.  Provided written and verbal instructions on pursed lip breathing and utilized returned demonstration as teach back Provided instruction about proper use of medications used for management of COPD including inhalers Advised patient to self assesses COPD action plan zone and make appointment with provider if in the yellow zone for 48 hours without improvement Advised patient to engage in light exercise as tolerated 3-5 days a week to aid in the the management of COPD Provided education about and advised patient to utilize infection prevention strategies to  reduce risk of respiratory infection Discussed the importance of adequate rest and management of fatigue with COPD   Anxiety and Depression  (Status: Goal on Track (progressing): YES.) Long Term Goal  Evaluation of current treatment plan related to Anxiety and Depression, Mental Health Concerns  and the need for a new mental health provider  self-management and patient's adherence to plan as established by provider. Discussed plans with patient for ongoing care management follow up and provided patient with direct contact information for care management team Advised patient to call the office for changes in mood, anxiety, depression, or worsening mental health conditions; Provided education to patient re: continuing to reach out to the resources provided to her for mental health services. She has been unable to secure a new mental health provider; Reviewed medications with patient and discussed compliance. 03-05-2021: The patient states that she is compliant with her medications but [employer] called her last week and told her she needed to talk to her doctor about prescribing something not as "dangerous as Klonopin". The patient has not called the office to discuss this. Will collaborate with the pcp and CCM team for recommendations. The patient has been unable to secure a new mental health provider in the area; Collaborated with pcp, pharm  D, and LCSW regarding mental health provider concerns and the patient expressing that her insurance company called her last week and ask her to discuss with her provider about an alternative medication to "klonopin" because it was "too dangerous  for her to take". Education and support given. The RNCM will reach out to the team for recommendations and support. The patient has her medications and is taking as directed. The patient denies any acute distress or changes in her mental health at this time; Provided patient with mindfulness, deep breathing exercises, and medication  educational materials related to effective management of depression and anxiety.  The patient states that when she is "nervous" she finds comfort in deep breathing, resting, and spending time with her little dog: "Peggy"; Reviewed scheduled/upcoming provider appointments including no new appointments at this time. The patient may need an appointment to come in to talk to pcp about mental health needs ; Social Work referral for ongoing support and education for mental health needs. The patient has worked with the LCSW in the past and also has an upcoming appointment for 03-20-2021 at 10 am with LCSW; Discussed plans with patient for ongoing care management follow up and provided patient with direct contact information for care management team; Advised patient to discuss mental health concerns and medications  with provider; Screening for signs and symptoms of depression related to chronic disease state;  Assessed social determinant of health barriers;    Hyperlipidemia:  (Status: Goal on Track (progressing): YES.) Long Term Goal       Lab Results  Component Value Date    CHOL 135 04/10/2018    HDL 59 04/10/2018    LDLCALC 64 04/10/2018    TRIG 50 04/10/2018    CHOLHDL 2.3 04/10/2018      Medication review performed; medication list updated in electronic medical record.  Provider established cholesterol goals reviewed; Counseled on importance of regular laboratory monitoring as prescribed; Provided HLD educational materials; Reviewed role and benefits of statin for ASCVD risk reduction; Discussed strategies to manage statin-induced myalgias; Reviewed importance of limiting foods high in cholesterol;   Hypertension: (Status: Goal on Track (progressing): YES.) Long Term Goal  Last practice recorded BP readings:     BP Readings from Last 3 Encounters:  02/21/21 (!) 126/59  01/01/21 124/68  11/17/20 130/70  Most recent eGFR/CrCl:       Lab Results  Component Value Date    EGFR 52 (L)  01/01/2021    No components found for: CRCL   Evaluation of current treatment plan related to hypertension self management and patient's adherence to plan as established by provider;   Provided education to patient re: stroke prevention, s/s of heart attack and stroke; Reviewed prescribed diet heart healthy Reviewed medications with patient and discussed importance of compliance;  Discussed plans with patient for ongoing care management follow up and provided patient with direct contact information for care management team; Advised patient, providing education and rationale, to monitor blood pressure daily and record, calling PCP for findings outside established parameters;  Advised patient to discuss changes in blood pressures or heart health with provider; Provided education on prescribed diet heart healthy/ADA diet ;  Discussed complications of poorly controlled blood pressure such as heart disease, stroke, circulatory complications, vision complications, kidney impairment, sexual dysfunction;    Pain:  (Status: Goal on Track (progressing): YES.) Long Term Goal  Pain assessment performed. 03-05-2021: The patient denies any pain at this time. The patient states her pain is stable Medications reviewed. 03-05-2021: The patient takes medications as prescribed Reviewed provider established plan for pain management; Discussed importance of adherence to all scheduled medical appointments; Counseled on the importance of reporting any/all new or changed pain symptoms or management strategies to  pain management provider; Advised patient to report to care team affect of pain on daily activities; Discussed use of relaxation techniques and/or diversional activities to assist with pain reduction (distraction, imagery, relaxation, massage, acupressure, TENS, heat, and cold application; Reviewed with patient prescribed pharmacological and nonpharmacological pain relief strategies; Advised patient to discuss  unresolved pain, changes in level or intensity of pain with provider;   Patient Goals/Self-Care Activities: Take medications as prescribed   Attend all scheduled provider appointments Call pharmacy for medication refills 3-7 days in advance of running out of medications Perform all self care activities independently  Perform IADL's (shopping, preparing meals, housekeeping, managing finances) independently Call provider office for new concerns or questions  Work with the social worker to address care coordination needs and will continue to work with the clinical team to address health care and disease management related needs call the Suicide and Crisis Lifeline: 988 call the [place] National Suicide Prevention Lifeline: 3438092860 or TTY: 940-387-9877 TTY (959)378-8975) to talk to a trained counselor call 1-800-273-TALK (toll free, 24 hour hotline) if experiencing a Mental Health or [place]  call office if I gain more than 2 pounds in one day or 5 pounds in one week keep legs up while sitting track weight in diary use salt in moderation watch for swelling in feet, ankles and legs every day weigh myself daily begin a heart failure diary bring diary to all appointments develop a rescue plan follow rescue plan if symptoms flare-up eat more whole grains, fruits and vegetables, lean meats and healthy fats know when to call the doctorfor changes in HF, increased swelling in feet, legs, abdomen, and sx and sx of dehydration  track symptoms and what helps feel better or worse dress right for the weather, hot or cold - avoid second hand smoke - eliminate smoking in my home - identify and avoid work-related triggers - identify and remove indoor air pollutants - limit outdoor activity during cold weather - listen for public air quality announcements every day - do breathing exercises every day - arrange respite care for caregiver - attend pulmonary rehabilitation - begin a  symptom diary - develop a rescue plan - eliminate symptom triggers at home - follow rescue plan if symptoms flare-up - use an extra pillow to sleep - develop a new routine to improve sleep - don't eat or exercise right before bedtime - eat healthy/prescribed diet: heart healthy - get at least 7 to 8 hours of sleep at night - use devices that will help like a cane, sock-puller or reacher - practice relaxation or meditation daily - do exercises in a comfortable position that makes breathing as easy as possible check blood pressure weekly choose a place to take my blood pressure (home, clinic or office, retail store) write blood pressure results in a log or diary learn about high blood pressure keep a blood pressure log take blood pressure log to all doctor appointments call doctor for signs and symptoms of high blood pressure develop an action plan for high blood pressure keep all doctor appointments take medications for blood pressure exactly as prescribed report new symptoms to your doctor eat more whole grains, fruits and vegetables, lean meats and healthy fats - call for medicine refill 2 or 3 days before it runs out - take all medications exactly as prescribed - call doctor with any symptoms you believe are related to your medicine - call doctor when you experience any new symptoms - go to all doctor appointments  as scheduled - adhere to prescribed diet: heart healthy    Our next appointment is by telephone on 04-23-2021 at 1145 am  Please call the care guide team at [phone] if you need to cancel or reschedule your appointment.   If you are experiencing a Mental Health or [place] or need someone to talk to, please call the Suicide and Crisis Lifeline: 988 call the [place] National Suicide Prevention Lifeline: 603-086-2262 or TTY: 812 457 1396 TTY 310-844-4238) to talk to a trained counselor call 1-800-273-TALK (toll free, 24 hour hotline)   The patient  verbalized understanding of instructions, educational materials, and care plan provided today and declined offer to receive copy of patient instructions, educational materials, and care plan.   [name] RN, MSN, [place] Mobile: [phone]

## 2021-03-08 ENCOUNTER — Ambulatory Visit
Admission: RE | Admit: 2021-03-08 | Discharge: 2021-03-08 | Disposition: A | Discharge status: Home / Self Care | Payer: Medicare Other | Source: Ambulatory Visit | Attending: Acute Care | Admitting: Acute Care

## 2021-03-08 ENCOUNTER — Other Ambulatory Visit: Payer: Self-pay

## 2021-03-08 DIAGNOSIS — I700 Atherosclerosis of aorta: Secondary | ICD-10-CM | POA: Diagnosis not present

## 2021-03-08 DIAGNOSIS — Z87891 Personal history of nicotine dependence: Secondary | ICD-10-CM | POA: Diagnosis not present

## 2021-03-08 DIAGNOSIS — J439 Emphysema, unspecified: Secondary | ICD-10-CM | POA: Diagnosis not present

## 2021-03-08 DIAGNOSIS — F17210 Nicotine dependence, cigarettes, uncomplicated: Secondary | ICD-10-CM | POA: Insufficient documentation

## 2021-03-12 ENCOUNTER — Telehealth: Payer: Self-pay | Admitting: Acute Care

## 2021-03-12 ENCOUNTER — Other Ambulatory Visit: Payer: Self-pay

## 2021-03-12 DIAGNOSIS — Z87891 Personal history of nicotine dependence: Secondary | ICD-10-CM

## 2021-03-12 NOTE — Telephone Encounter (Signed)
Reached patient by phone to discuss results of recent LDCT. Impression from radiology noted no concerns for lung cancer. Previous 'new' nodule was not seen on this exam.  Small hiatal hernia noted.  Other findings for emphysema and atherosclerosis as previously noted.  Will return to annual LDCT schedule. Patient acknowledged understanding and had no further questions

## 2021-03-16 ENCOUNTER — Other Ambulatory Visit: Payer: Self-pay | Admitting: Pulmonary Disease

## 2021-03-20 ENCOUNTER — Ambulatory Visit: Payer: Medicare Other | Admitting: Licensed Clinical Social Worker

## 2021-03-20 DIAGNOSIS — I272 Pulmonary hypertension, unspecified: Secondary | ICD-10-CM

## 2021-03-20 DIAGNOSIS — F339 Major depressive disorder, recurrent, unspecified: Secondary | ICD-10-CM

## 2021-03-20 DIAGNOSIS — F411 Generalized anxiety disorder: Secondary | ICD-10-CM

## 2021-03-20 DIAGNOSIS — J449 Chronic obstructive pulmonary disease, unspecified: Secondary | ICD-10-CM

## 2021-03-20 NOTE — Chronic Care Management (AMB) (Signed)
Chronic Care Management    Clinical Social Work Note  03/20/2021 Name: Beth [name] MRN: [id number] DOB: [birth year]  Beth [name] is a 76 y.o. year old female who is a primary care patient of [name], DO. The CCM team was consulted to assist the patient with chronic disease management and/or care coordination needs related to: Mental Health Counseling and Resources.   Engaged with patient by telephone for follow up visit in response to provider referral for social work chronic care management and care coordination services.   Consent to Services:  The patient was given information about Chronic Care Management services, agreed to services, and gave verbal consent prior to initiation of services.  Please see initial visit note for detailed documentation.   Patient agreed to services and consent obtained.   Assessment: Review of patient past medical history, allergies, medications, and health status, including review of relevant consultants reports was performed today as part of a comprehensive evaluation and provision of chronic care management and care coordination services.     SDOH (Social Determinants of Health) assessments and interventions performed:    Advanced Directives Status: Not addressed in this encounter.  CCM Care Plan  Allergies  Allergen Reactions   Bextra  [Valdecoxib]    Compazine [Prochlorperazine Edisylate]     Stroke-like symptoms   Lithium Carbonate     Leg weakness   Lyrica [Pregabalin]     Outpatient Encounter Medications as of 03/20/2021  Medication Sig   acetaminophen (TYLENOL) 500 MG tablet Take 500 mg by mouth 2 (two) times daily as needed.   albuterol (VENTOLIN HFA) 108 (90 Base) MCG/ACT inhaler Inhale into the lungs every 6 (six) hours as needed for wheezing or shortness of breath.   aspirin 81 MG chewable tablet Chew 81 mg daily by mouth.   atorvastatin (LIPITOR) 40 MG tablet TAKE 1 TABLET BY MOUTH EVERY DAY   clonazePAM  (KLONOPIN) 0.5 MG tablet Take 1 tablet (0.5mg ) every other day as needed for anxiety.   DULoxetine (CYMBALTA) 20 MG capsule Take 1 capsule (20 mg total) by mouth daily.   FLUoxetine (PROZAC) 20 MG capsule Take 3 capsules (60 mg total) by mouth daily.   furosemide (LASIX) 20 MG tablet Take 2 tablets (40 mg total) by mouth daily. If swelling is less, can take only 1 pill.   gabapentin (NEURONTIN) 300 MG capsule Take 2 capsules (600 mg total) by mouth 3 (three) times daily.   HYDROcodone-acetaminophen (NORCO/VICODIN) 5-325 MG tablet Take 1-2 tablets by mouth daily as needed for moderate pain.   hydrOXYzine (ATARAX/VISTARIL) 25 MG tablet Take 25-50 mg by mouth daily as needed.   Melatonin 3 MG CAPS Take 10 mg by mouth at bedtime as needed (sleep).    metoprolol succinate (TOPROL-XL) 25 MG 24 hr tablet TAKE 1/2 TABLET(12.5 MG) BY MOUTH DAILY   montelukast (SINGULAIR) 10 MG tablet TAKE 1 TABLET(10 MG) BY MOUTH AT BEDTIME   multivitamin-iron-minerals-folic acid (CENTRUM) chewable tablet Chew 1 tablet by mouth daily.   potassium chloride (KLOR-CON) 10 MEQ tablet Take 1 tablet (10 mEq total) by mouth daily.   predniSONE (DELTASONE) 20 MG tablet Take daily with food. Start with 60mg  (3 pills) x 2 days, then reduce to 40mg  (2 pills) x 2 days, then 20mg  (1 pill) x 3 days   QUEtiapine (SEROQUEL) 100 MG tablet Take 1 tablet (100 mg total) by mouth at bedtime.   TRELEGY ELLIPTA 200-62.5-25 MCG/ACT AEPB INHALE 1 PUFF INTO THE LUNGS DAILY  No facility-administered encounter medications on file as of 03/20/2021.    Patient Active Problem List   Diagnosis Date Noted   Chronic diastolic congestive heart failure ([place]) 11/14/2020   Rash 01/14/2020   Pulmonary hypertension ([place]) 09/14/2019   Coccydynia 07/29/2019   Cervicalgia 05/04/2019   Acute pain of left shoulder 05/04/2019   Elbow pain, left 05/04/2019   DOE (dyspnea on exertion) 03/23/2019   Leg swelling 03/22/2019    Class: Acute   Lumbar degenerative  disc disease 09/10/2018   Left hip pain 09/10/2018   Post-traumatic osteoarthritis of left elbow 07/14/2018   Lumbar radiculopathy 02/12/2018   Lumbar spondylosis 02/12/2018   Lumbar facet arthropathy 02/12/2018   Atherosclerosis of aorta ([place]) 09/17/2017   Lung nodule 09/17/2017   Urinary tract infection 12/05/2016   Protein-calorie malnutrition, mild (HCC) 08/07/2016   Generalized anxiety disorder 07/15/2016   Chronic pain syndrome 07/15/2016   Severe episode of recurrent major depressive disorder, without psychotic features ([place]) 07/14/2016   Moderate benzodiazepine use disorder ([place]) 05/08/2016   Major depressive disorder, recurrent, severe w/o psychotic behavior ([place]) 05/01/2016   Seasonal allergic rhinitis 03/23/2015   Hyperglycemia 03/23/2015   Senile purpura ([place]) 03/23/2015   Perennial allergic rhinitis with seasonal variation 03/23/2015   Marital problems 10/31/2014   Migraine without aura and without status migrainosus, not intractable 10/31/2014   Myofascial pain syndrome of lumbar spine 08/09/2014   Colon polyp 08/09/2014   COPD, severe ([place]) 08/09/2014   CVA, old, hemiparesis ([place]) 08/09/2014   Dyslipidemia 08/09/2014   Dysfunction of eustachian tube 08/09/2014   Fibromyalgia syndrome 08/09/2014   Gastro-esophageal reflux disease without esophagitis 08/09/2014   Benign migrating glossitis 08/09/2014   Cerebrovascular accident, old 08/09/2014   IBS (irritable bowel syndrome) 08/09/2014   Low back pain with radiation 08/09/2014   Chronic recurrent major depressive disorder ([place]) 68/12/7515   Dysmetabolic syndrome 00/17/4944   OP (osteoporosis) 08/09/2014   Vitamin D deficiency 08/09/2014   Smoking 08/09/2014   Benign hypertension 07/19/2013   Benign neoplasm of skin of trunk 06/03/2013   H/O: pneumonia 09/25/2012    Conditions to be addressed/monitored: HTN, Anxiety, Depression, and Osteoporosis; Mental Health Concerns   Care Plan : General Social Work (Adult)   Updates made by Rebekah Chesterfield, LCSW since 03/20/2021 12:00 AM     Problem: Response to Treatment (Depression)      Long-Range Goal: Response to Treatment Maximized   Start Date: 04/25/2020  This Visit's Progress: On track  Recent Progress: On track  Priority: Medium  Note:   Timeframe:  Long-Range Goal Priority:  Medium Start Date:  04/25/20                          Expected End Date:  09/27/21                 Follow Up Date-04/03/21   Current Barriers:  Chronic Mental Health needs related to depression Financial constraints related to managing health care expenses Limited social support ADL IADL limitations Limited access to caregiver Inability to perform ADL's independently Suicidal Ideation/Homicidal Ideation: No Clinical Social Work Goal(s):  Over the next 120 days, patient will work with SW bi-monthly by telephone or in person to reduce or manage symptoms related to depression Over the next 120 days, patient will work with SW to address concerns related to decreasing depression and increasing self-care Interventions: Patient interviewed and appropriate assessments performed: brief mental health assessment Patient reports depression symptoms have  improved; however, still having difficulty managing anxiety. Pt was unable to identify any specific triggers Patient shared that she has not heard back from [employer] to initiate mental health services. Patient provided verbal consent for CCM LCSW to contact agency. CCM LCSW spoke with Lexine Baton and informed her that patient's e-mail is inaccessible. Nikki agreed to have a staff member visit patient's residence to assist with required paperwork to begin services. They will call patient with details 07/22: Patient still has not heard from [facility]. States she has left numerous voice mails 2/21: Patient continues to comply with med management through PCP. States she is unable to participate in virtual visits (which is preferred  method for many psychiatrists) and has transportation barriers. LCSW will continue to research geriatric psychiatrists that are in-network. Patient shared she was accepted in [place]; however, it is too far to travel CCM LCSW discussed with patient if she was willing to try a new referral to a different psychiatrist. Patient shared she has concerns that she was dismissed from [facility] years ago after a misunderstanding. 08/03: Patient provided verbal consent for CCM LCSW to complete referral to [facility] for medication management. CCM LCSW reached out to [facility] on 08/28/20 to inquire about patient's status. Patient was indeed dismissed from their practice and is unable to return  CCM LCSW collaborated with [facility] [phone] to inquire about psychiatry appointment. Spoke with Program Director, Elberta Fortis, who reviewed patient's insurance and confirmed that they will schedule an initial intake with patient to begin medication management. Intake documents will be provided electronically for patient to complete, to expedite services, which can be provided through tele-health or in-person  CCM LCSW informed patient to expect a call from [facility], the intake specialist. Patient agreed to call and leave a message for a return call, if she is unable to reach them. States she is very motivated to begin services Patient reports that her depression and anxiety symptoms are "level"  Patient agreed to contact CCM LCSW with any update regarding scheduling with [facility] Patient is aware of strategies to assist with management of symptoms and agreed to continue practicing breathing exercises to cope Patient is close with son and receives strong support from him. States that she doesn't speak often with daughter; however, children reside locally. She receives emotional support from dog. Patient denies any suicidal/homicidal ideations Collaboration with PCP regarding development and  update of comprehensive plan of care as evidenced by provider attestation and co-signature Inter-disciplinary care team collaboration (see longitudinal plan of care) Patient Self Care Activities:  Call provider office for new concerns or questions Contact office with any questions or concerns Practice deep breathing and grounding strategies to assist with management of symptoms       Christa See, MSW, [email or web address] Phone [phone] 5:52 PM

## 2021-03-20 NOTE — Patient Instructions (Signed)
Visit Information  Thank you for taking time to visit with me today. Please don't hesitate to contact me if I can be of assistance to you before our next scheduled telephone appointment.  Following are the goals we discussed today:  Patient Self Care Activities:  Call provider office for new concerns or questions Contact office with any questions or concerns Practice deep breathing and grounding strategies to assist with management of symptoms  Our next appointment is by telephone on 04/03/21 at 11:30 AM  Please call the care guide team at [phone] if you need to cancel or reschedule your appointment.   If you are experiencing a Mental Health or [place] or need someone to talk to, please call the Suicide and Crisis Lifeline: 988 call the [place] National Suicide Prevention Lifeline: 785 314 5958 or TTY: 408-788-9523 TTY (207) 227-4081) to talk to a trained counselor call 911   The patient verbalized understanding of instructions, educational materials, and care plan provided today and declined offer to receive copy of patient instructions, educational materials, and care plan.   Christa See, MSW, [facility] Care Management [email or web address] Phone [phone] 5:54 PM

## 2021-03-26 ENCOUNTER — Ambulatory Visit: Payer: Commercial Managed Care - HMO | Admitting: Pharmacist

## 2021-03-26 DIAGNOSIS — J449 Chronic obstructive pulmonary disease, unspecified: Secondary | ICD-10-CM

## 2021-03-26 DIAGNOSIS — I10 Essential (primary) hypertension: Secondary | ICD-10-CM

## 2021-03-26 NOTE — Patient Instructions (Signed)
Visit Information  Thank you for taking time to visit with me today. Please don't hesitate to contact me if I can be of assistance to you before our next scheduled telephone appointment.  Following are the goals we discussed today:   Goals Addressed             This Visit's Progress    Pharmacy Goals       Our goal bad cholesterol, or LDL, is less than 70. This is why it is important to continue taking your atorvastatin  Please check your home blood pressure and weights, keep a log of the results and bring this with you to your medical appointments.  Feel free to call me with any questions or concerns. I look forward to our next call!  Wallace Cullens, PharmD, [place] [phone]         Our next appointment is by telephone on 05/07/2021 at 10:45 AM  Please call the care guide team at [phone] if you need to cancel or reschedule your appointment.    The patient verbalized understanding of instructions, educational materials, and care plan provided today and declined offer to receive copy of patient instructions, educational materials, and care plan.

## 2021-03-26 NOTE — Chronic Care Management (AMB) (Signed)
Chronic Care Management CCM Pharmacy Note  03/26/2021 Name:  Beth [name] MRN:  [id number] DOB:  1945/11/07   Subjective: Beth [name] is an 76 y.o. year old female who is a primary patient of Beth Hauser, DO.  The CCM team was consulted for assistance with disease management and care coordination needs.    Engaged with patient by telephone for follow up visit for pharmacy case management and/or care coordination services.   Objective:  Medications Reviewed Today     Reviewed by Rennis Petty, RPH-CPP (Pharmacist) on 03/26/21 at 1134  Med List Status: <None>   Medication Order Taking? Sig Documenting Provider Last Dose Status Informant  acetaminophen (TYLENOL) 500 MG tablet [id number] Yes Take 500 mg by mouth 2 (two) times daily as needed. [provider] Taking Active   albuterol (VENTOLIN HFA) 108 (90 Base) MCG/ACT inhaler [id number] Yes Inhale into the lungs every 6 (six) hours as needed for wheezing or shortness of breath. [provider] Taking Active   aspirin 81 MG chewable tablet [id number] No Chew 81 mg daily by mouth.  Patient not taking: Reported on 03/26/2021   [provider] Not Taking Active Pharmacy Records           Med Note Dell Ponto Dec 18, 2016  4:14 PM)    atorvastatin (LIPITOR) 40 MG tablet [id number] Yes TAKE 1 TABLET BY MOUTH EVERY DAY Beth Hauser, DO Taking Active   clonazePAM (KLONOPIN) 0.5 MG tablet [id number] Yes Take 1 tablet (0.5mg ) every other day as needed for anxiety. Beth Hauser, DO Taking Active   DULoxetine (CYMBALTA) 20 MG capsule [id number] Yes Take 1 capsule (20 mg total) by mouth daily. Beth Hauser, DO Taking Active   FLUoxetine (PROZAC) 20 MG capsule [id number]  Take 3 capsules (60 mg total) by mouth daily. Karamalegos, Beth Doughty, DO  Active   furosemide (LASIX) 20 MG tablet [id number] Yes Take 2 tablets (40 mg total) by mouth daily. If swelling is  less, can take only 1 pill. Beth Hauser, DO Taking Active   gabapentin (NEURONTIN) 300 MG capsule [id number] Yes Take 2 capsules (600 mg total) by mouth 3 (three) times daily. Beth Santa, MD Taking Active   HYDROcodone-acetaminophen (NORCO/VICODIN) 5-325 MG tablet [id number] No Take 1-2 tablets by mouth daily as needed for moderate pain.  Patient not taking: Reported on 03/26/2021   Beth Santa, MD Not Taking Active   hydrOXYzine (ATARAX/VISTARIL) 25 MG tablet [id number] No Take 25-50 mg by mouth daily as needed.  Patient not taking: Reported on 03/26/2021   [provider] Not Taking Active   Melatonin 3 MG CAPS [id number] Yes Take 10 mg by mouth at bedtime as needed (sleep).  [provider] Taking Active Self  metoprolol succinate (TOPROL-XL) 25 MG 24 hr tablet [id number] Yes TAKE 1/2 TABLET(12.5 MG) BY MOUTH DAILY [place], Beth Doughty, DO Taking Active   montelukast (SINGULAIR) 10 MG tablet [id number] Yes TAKE 1 TABLET(10 MG) BY MOUTH AT BEDTIME Beth Hauser, DO Taking Active   multivitamin-iron-minerals-folic acid (CENTRUM) chewable tablet [id number] Yes Chew 1 tablet by mouth daily. [provider] Taking Active   potassium chloride (KLOR-CON) 10 MEQ tablet [id number] Yes Take 1 tablet (10 mEq total) by mouth daily. Beth Hauser, DO Taking Active   QUEtiapine (SEROQUEL) 100 MG tablet [id number] Yes Take 1 tablet (100 mg total) by mouth at bedtime. Beth Hauser, DO Taking Active   TRELEGY ELLIPTA 200-62.5-25  MCG/ACT AEPB [id number] Yes INHALE 1 PUFF INTO THE LUNGS DAILY Beth Pita, MD Taking Active   Med List Note Rise Patience, RN 02/12/18 1055): Medication agreement signed 02/12/2018 UDS 02/24/17 03/25/17 PA requested for gabapentin sent SB             Pertinent Labs:   Lab Results  Component Value Date   CHOL 135 04/10/2018   HDL 59 04/10/2018   LDLCALC 64 04/10/2018   TRIG 50 04/10/2018    CHOLHDL 2.3 04/10/2018   Lab Results  Component Value Date   CREATININE 1.10 (H) 01/01/2021   BUN 19 01/01/2021   NA 143 01/01/2021   K 3.6 01/01/2021   CL 102 01/01/2021   CO2 29 01/01/2021   BP Readings from Last 3 Encounters:  02/21/21 (!) 126/59  01/01/21 124/68  11/17/20 130/70   Pulse Readings from Last 3 Encounters:  02/21/21 80  01/01/21 69  11/17/20 69     SDOH:  (Social Determinants of Health) assessments and interventions performed:    [place]  Review of patient past medical history, allergies, medications, health status, including review of consultants reports, laboratory and other test data, was performed as part of comprehensive evaluation and provision of chronic care management services.   Care Plan : PharmD - Med Mgmt  Updates made by Rennis Petty, RPH-CPP since 03/26/2021 12:00 AM     Problem: Disease Progression      Long-Range Goal: Disease Progression Prevented or Minimized   Start Date: 04/10/2020  Expected End Date: 07/09/2020  Recent Progress: On track  Priority: High  Note:   Current Barriers:  Chronic Disease Management support, education, and care coordination needs related to previous CVA, COPD, hypertension, hyperlipidemia, depression  Pharmacist Clinical Goal(s):  Over the next 90 days, patient will achieve adherence to monitoring guidelines and medication adherence to achieve therapeutic efficacy through collaboration with PharmD and provider.   Interventions: 1:1 collaboration with Beth Hauser, DO regarding development and update of comprehensive plan of care as evidenced by provider attestation and co-signature Inter-disciplinary care team collaboration (see longitudinal plan of care) Perform chart review Patient seen for Office Visit with PCP on 1/25 for Hospitalization Follow-up and Shortness of Breath. Provider advised patient: Increase dose Lasix (furosemide) from 1 pill daily now take TWO pills at same  time daily in morning = 40mg  dose, some days you may take only ONE pill if not as bad swelling If breathing coughing wheezing does not improve within 3 days of double fluid pill - go ahead and take 1 week taper steroid Follow up with [facility], as referred by Cardiology Patient seen at [facility] on 02/06/2021 for evaluation of chest pain, fever, cough, or shortness of breath Note CCM LCSW is working with patient to research geriatric psychiatrists that are in-network through her insurance Today patient reports breathing is improved since seen by PCP Again encourage patient to call  [facility], as referred by Cardiology. Provide patient with phone number Patient reports she stopped taking her aspirin 81 mg ~3-4 weeks ago because of bad bruising in her arms. Reports bruising resolved since stopped aspirin. Note patient has history of stroke. Previously seen by [facility] Patient asks for PCP opinion on whether to restart. Collaborate with PCP Follow up with patient to share recommendation from provider that she restart aspirin 81 mg daily for secondary prevention of stroke. Counsel patient on avoiding activities that increase risk,  avoiding medications such as NSAIDs and to monitor for bruising/bleeding  Moderate to Severe COPD:  Patient followed by [facility] in [place] Current treatment: Trelegy 1 puff daily , rinse after use Patient confirms rinsing after use.  Albuterol inhaler as needed Overnight oxygen as directed by Pulmonology Confirms picked up refill of albuterol  Hypertension Reports taking: Furosemide 20 mg - 2 tablets (40 mg) daily Potassium chloride 10 mEq daily Metoprolol ER 25 mg - 1/2 tablet (12.5 mg ) daily Reports swelling has improved, but some swelling remains Plans to call to follow up with [facility] (see above) Reports checked blood pressure ~1  week ago, but does not recall/did not record result Denies symptoms of hypotension Encourage patient monitor home blood pressure, and restart keeping log of BP and bring record to medical appointments Encourage patient to continue to monitor home weights and bring this record to medical appointments   Patient Goals/Self-Care Activities Over the next 90 days, patient will:  To self administers medications as prescribed Encourage patient to obtain weekly pillbox to use as adherence tool Attends all scheduled provider appointments Calls pharmacy for medication refills Calls provider office for new concerns or questions  Follow Up Plan: CM Pharmacist will outreach to patient by telephone next on 05/07/2021 at 10:45 AM       Wallace Cullens, PharmD, [place], [facility] [place] [phone]

## 2021-03-27 DIAGNOSIS — J441 Chronic obstructive pulmonary disease with (acute) exacerbation: Secondary | ICD-10-CM | POA: Diagnosis not present

## 2021-04-03 ENCOUNTER — Telehealth: Payer: Medicare Other

## 2021-04-03 ENCOUNTER — Telehealth: Payer: Self-pay | Admitting: Licensed Clinical Social Worker

## 2021-04-03 NOTE — Telephone Encounter (Signed)
? ?   Clinical Social Work  ?Care Management  ? Phone Outreach  ? ? ?04/03/2021 ?Name: DEAN WONDER MRN: [id number] DOB: 1945/07/24 ? ?BRAXTON WEISBECKER is a 76 y.o. year old female who is a primary care patient of [name], DO .  ? ?Reason for referral: Mental Health Counseling and Resources.   ? ?F/U phone call today to assess needs, progress and barriers with care plan goals.   Telephone outreach was unsuccessful. A HIPPA compliant phone message was left for the patient providing contact information and requesting a return call.  ? ?Plan:CCM LCSW will wait for return call. If no return call is received, LCSW will make another attempt within 14 days ? ?Review of patient status, including review of consultants reports, relevant laboratory and other test results, and collaboration with appropriate care team members and the patient's provider was performed as part of comprehensive patient evaluation and provision of care management services.   ? ?Christa See, MSW, LCSW ?[facility] Care Management ?[facility] ?[email or web address] ?Phone [phone] ?2:36 PM ? ? ? ?  ? ? ? ? ?

## 2021-04-08 IMAGING — CR DG SACRUM/COCCYX 2+V
1 series · 3 of 3 positions shown · non-contrast
Comparison: 02/24/2017

CLINICAL DATA: Sacral pain. RIGHT tailbone pain for 3 months. No
known injury.

EXAM:
SACRUM AND COCCYX - 2+ VIEW

[Series 1: dg sacrum/coccyx · 0.14mm/px · 3 of 3 slices shown]
[im 1/3]
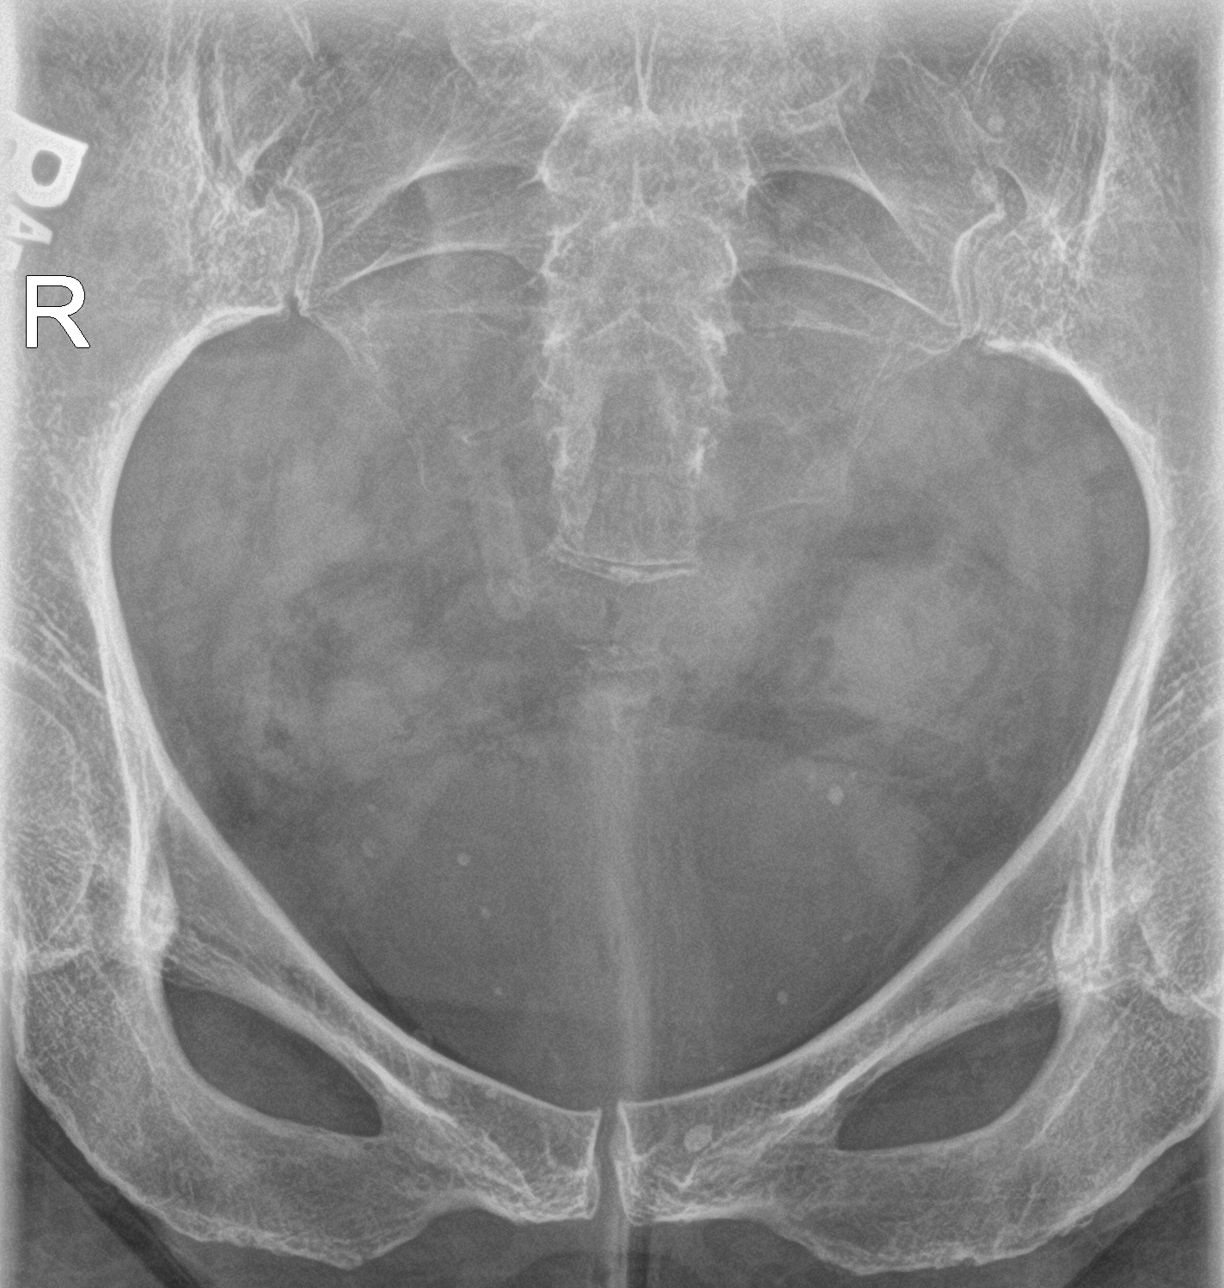
[im 2/3]
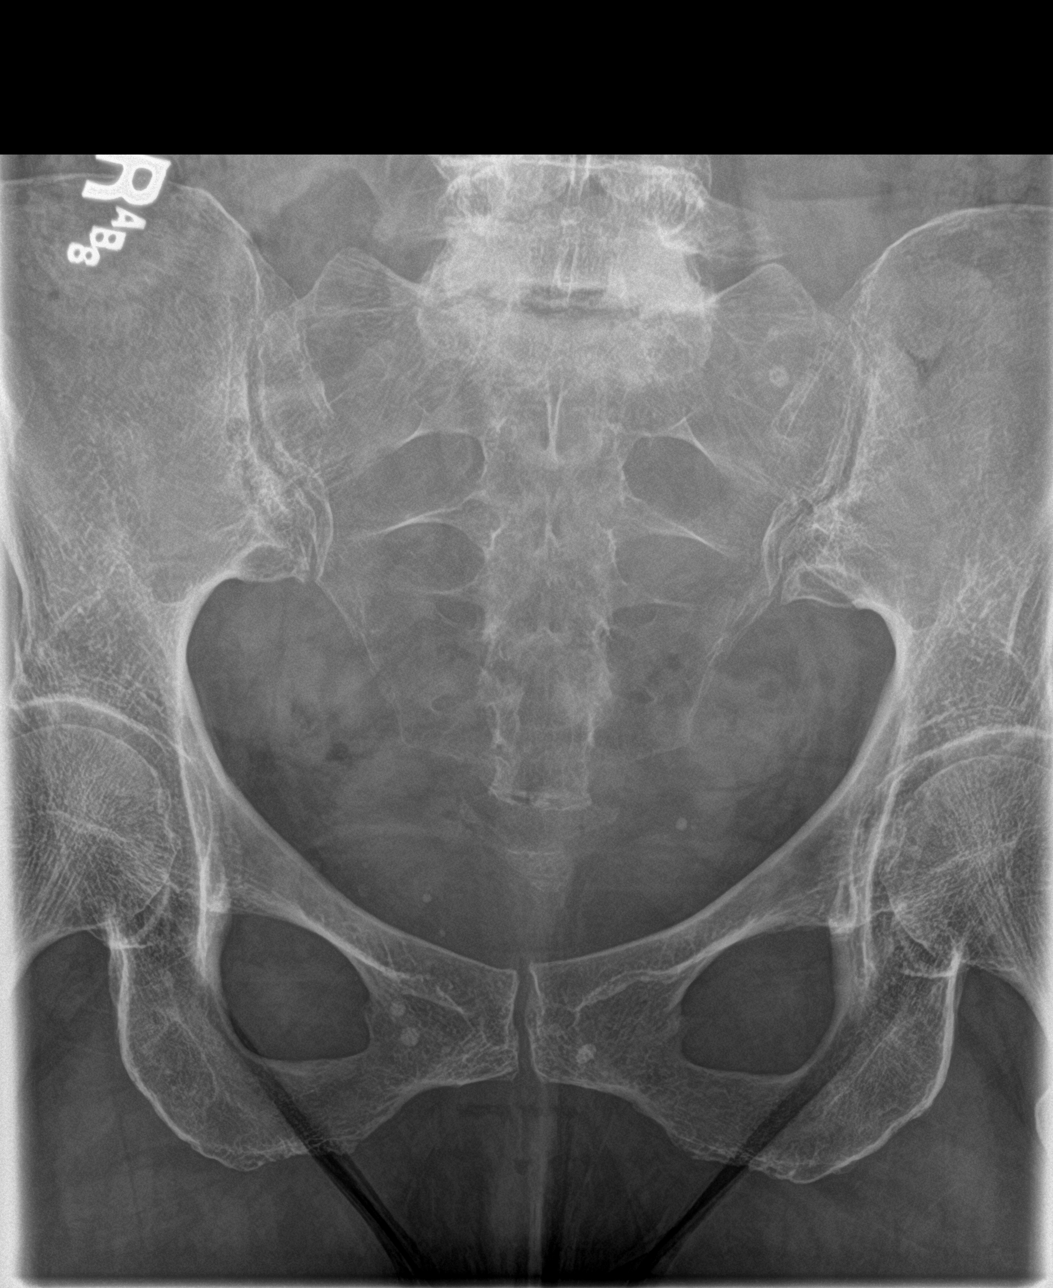
[im 3/3]
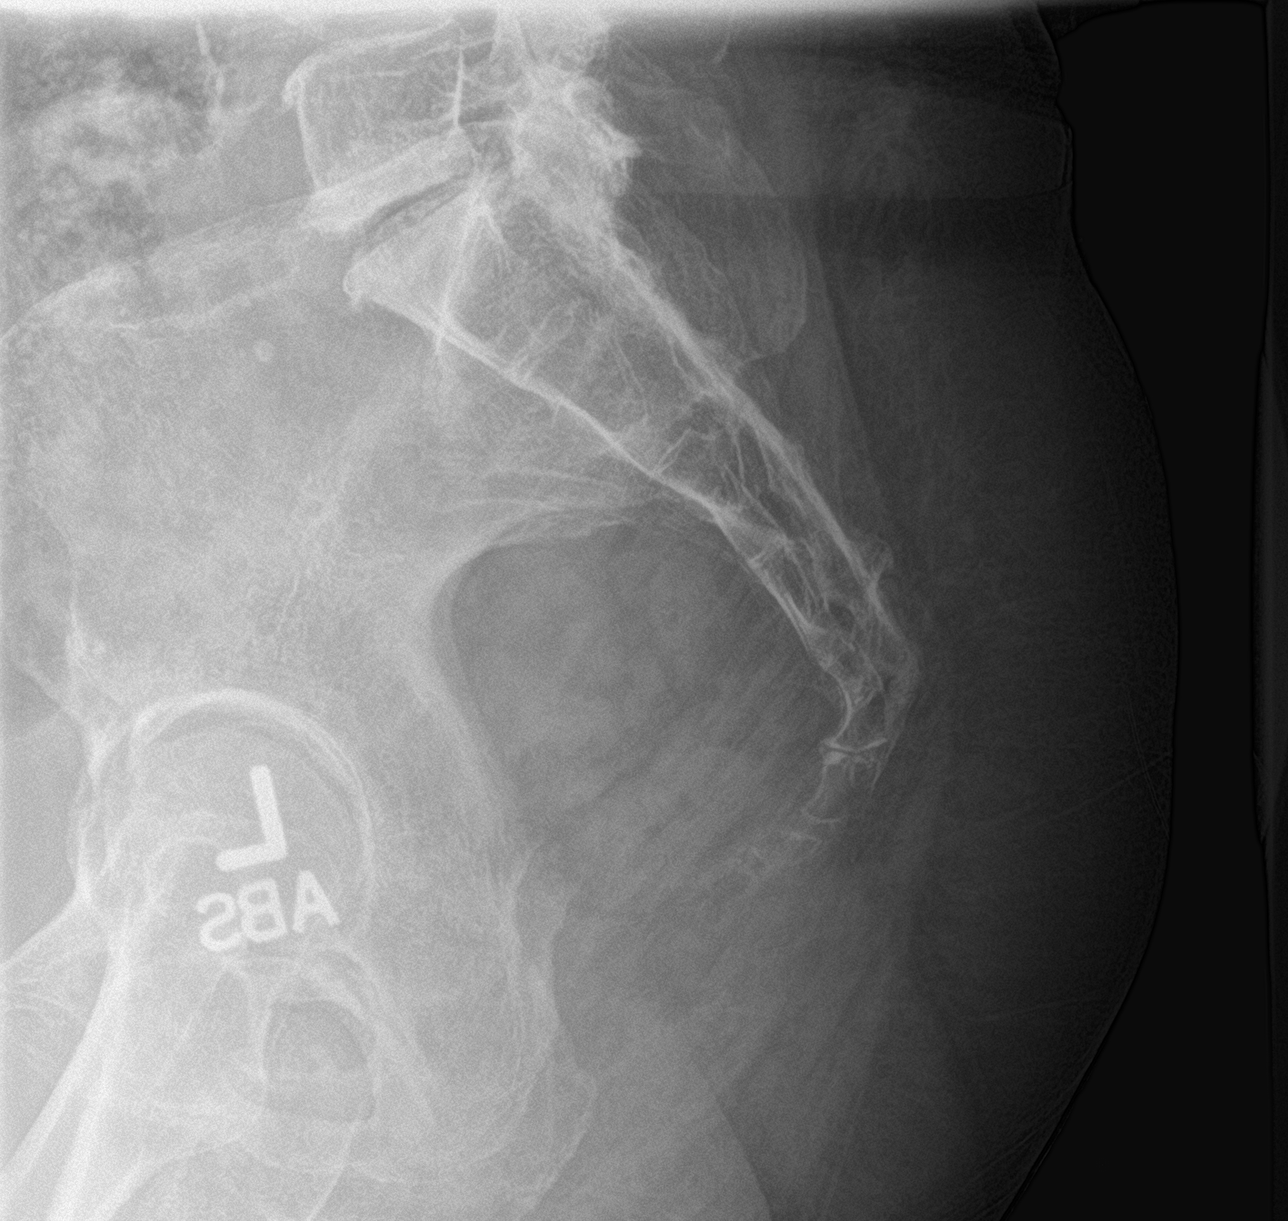

[3 of 3 positions shown; findings below may reference images not displayed]

FINDINGS: There is no evidence of fracture or other focal bone lesions. Mild
degenerative changes are seen in the LOWER lumbar spine,
particularly at the lumbosacral junction.
IMPRESSION: Negative.

## 2021-04-23 ENCOUNTER — Telehealth: Payer: Self-pay

## 2021-04-23 ENCOUNTER — Telehealth: Payer: Medicare Other

## 2021-04-23 NOTE — Telephone Encounter (Signed)
?  Care Management  ? ?Follow Up Note ? ? ?04/23/2021 ?Name: Beth [name] MRN: [id number] DOB: [birth year] ? ? ?Referred by: [name], DO ?Reason for referral : Chronic Care Management (RNCM: Follow up for Chronic Disease Management and Care Coordination Needs ) ? ? ?An unsuccessful telephone outreach was attempted today. The patient was referred to the case management team for assistance with care management and care coordination.  ? ?Follow Up Plan: A HIPPA compliant phone message was left for the patient providing contact information and requesting a return call.  ? ?[name] RN, MSN, CCM ?Community Care Coordinator ?[facility] ?[facility] ?Mobile: [phone]  ?

## 2021-04-26 DIAGNOSIS — J441 Chronic obstructive pulmonary disease with (acute) exacerbation: Secondary | ICD-10-CM | POA: Diagnosis not present

## 2021-04-30 ENCOUNTER — Encounter (INDEPENDENT_AMBULATORY_CARE_PROVIDER_SITE_OTHER): Payer: Self-pay

## 2021-04-30 ENCOUNTER — Encounter (INDEPENDENT_AMBULATORY_CARE_PROVIDER_SITE_OTHER): Payer: Self-pay | Admitting: Vascular Surgery

## 2021-05-02 ENCOUNTER — Other Ambulatory Visit: Payer: Self-pay | Admitting: Family Medicine

## 2021-05-02 DIAGNOSIS — F5104 Psychophysiologic insomnia: Secondary | ICD-10-CM

## 2021-05-02 DIAGNOSIS — F411 Generalized anxiety disorder: Secondary | ICD-10-CM

## 2021-05-03 NOTE — Telephone Encounter (Signed)
Requested medication (s) are due for refill today - yes ? ?Requested medication (s) are on the active medication list -yes ? ?Future visit scheduled - no ? ?Last refill: 11/14/20 #15 2RF ? ?Notes to clinic: Request RF: non delegated Rx ? ?Requested Prescriptions  ?Pending Prescriptions Disp Refills  ? clonazePAM (KLONOPIN) 0.5 MG tablet [Pharmacy Med Name: CLONAZEPAM 0.'5MG'$  TABLETS] 15 tablet   ?  Sig: TAKE 1 TABLET(0.5 MG) BY MOUTH EVERY OTHER DAY AS NEEDED FOR ANXIETY  ?  ? Not Delegated - Psychiatry: Anxiolytics/Hypnotics 2 Failed - 05/02/2021  2:23 PM  ?  ?  Failed - This refill cannot be delegated  ?  ?  Failed - Urine Drug Screen completed in last 360 days  ?  ?  Passed - Patient is not pregnant  ?  ?  Passed - Valid encounter within last 6 months  ?  Recent Outpatient Visits   ? ?      ? 2 months ago Chronic diastolic congestive heart failure ([place])  ? [place], DO  ? 5 months ago Major depressive disorder, recurrent, severe w/o psychotic behavior ([place])  ? [place], DO  ? 1 year ago Major depressive disorder, recurrent, severe w/o psychotic behavior ([place])  ? [place], DO  ? 1 year ago Rash  ? [place], FNP  ? 1 year ago Benign hypertension  ? [facility], Lupita Raider, FNP  ? ?  ?  ?Future Appointments   ? ?        ? In 2 months Agbor-Etang, Aaron Edelman, MD [facility], [facility]  ? In 4 months  [facility], [place]  ? ?  ? ?  ?  ?  ? ? ? ?Requested Prescriptions  ?Pending Prescriptions Disp Refills  ? clonazePAM (KLONOPIN) 0.5 MG tablet [Pharmacy Med Name: CLONAZEPAM 0.'5MG'$  TABLETS] 15 tablet   ?  Sig: TAKE 1 TABLET(0.5 MG) BY MOUTH EVERY OTHER DAY AS NEEDED FOR ANXIETY  ?  ? Not Delegated - Psychiatry: Anxiolytics/Hypnotics 2 Failed - 05/02/2021  2:23 PM  ?  ?  Failed - This refill cannot be delegated  ?  ?   Failed - Urine Drug Screen completed in last 360 days  ?  ?  Passed - Patient is not pregnant  ?  ?  Passed - Valid encounter within last 6 months  ?  Recent Outpatient Visits   ? ?      ? 2 months ago Chronic diastolic congestive heart failure ([place])  ? [place], DO  ? 5 months ago Major depressive disorder, recurrent, severe w/o psychotic behavior ([place])  ? [place], DO  ? 1 year ago Major depressive disorder, recurrent, severe w/o psychotic behavior ([place])  ? [place], DO  ? 1 year ago Rash  ? [place], FNP  ? 1 year ago Benign hypertension  ? [facility], Lupita Raider, FNP  ? ?  ?  ?Future Appointments   ? ?        ? In 2 months Agbor-Etang, Aaron Edelman, MD [facility], [facility]  ? In 4 months  [facility], [place]  ? ?  ? ?  ?  ?  ? ? ? ?

## 2021-05-07 ENCOUNTER — Telehealth: Payer: Medicare Other

## 2021-05-07 ENCOUNTER — Telehealth: Payer: Self-pay | Admitting: Pharmacist

## 2021-05-07 NOTE — Telephone Encounter (Signed)
?  Chronic Care Management  ? ?Outreach Note ? ?05/07/2021 ?Name: Beth [name] MRN: [id number] DOB: [birth year] ? ?Referred by: [name], DO ?Reason for referral : No chief complaint on file. ? ? ? ?Was unable to reach patient via telephone today and have left HIPAA compliant voicemail asking patient to return my call.  ? ? ?Follow Up Plan: Will collaborate with Care Guide to outreach to schedule follow up with me ? ?[name], PharmD, BCACP ?Clinical Pharmacist ?[facility] ?[phone] ? ?

## 2021-05-11 ENCOUNTER — Ambulatory Visit: Payer: Self-pay | Admitting: *Deleted

## 2021-05-11 ENCOUNTER — Ambulatory Visit (INDEPENDENT_AMBULATORY_CARE_PROVIDER_SITE_OTHER): Payer: Medicare Other | Admitting: Pharmacist

## 2021-05-11 DIAGNOSIS — J449 Chronic obstructive pulmonary disease, unspecified: Secondary | ICD-10-CM

## 2021-05-11 DIAGNOSIS — I10 Essential (primary) hypertension: Secondary | ICD-10-CM

## 2021-05-11 NOTE — Chronic Care Management (AMB) (Signed)
? ?Chronic Care Management ?CCM Pharmacy Note ? ?05/11/2021 ?Name:  Beth [name] MRN:  [id number] DOB:  1945-03-23 ? ? ?Subjective: ?Beth [name] is an 76 y.o. year old female who is a primary patient of [name], DO.  The CCM team was consulted for assistance with disease management and care coordination needs.   ? ?Engaged with patient by telephone for follow up visit for pharmacy case management and/or care coordination services.  ? ?Objective: ? ?Medications Reviewed Today   ? ? Reviewed by Rennis Petty, RPH-CPP (Pharmacist) on 05/11/21 at 1242  Med List Status: <None>  ? ?Medication Order Taking? Sig Documenting Provider Last Dose Status Informant  ?acetaminophen (TYLENOL) 500 MG tablet [id number] Yes Take 500 mg by mouth 2 (two) times daily as needed. [provider] Taking Active   ?albuterol (VENTOLIN HFA) 108 (90 Base) MCG/ACT inhaler [id number] Yes Inhale into the lungs every 6 (six) hours as needed for wheezing or shortness of breath. [provider] Taking Active   ?aspirin 81 MG chewable tablet [id number] Yes Chew 81 mg by mouth daily. [provider] Taking Active Pharmacy Records  ?         ?Med Note Dell Ponto Dec 18, 2016  4:14 PM)    ?atorvastatin (LIPITOR) 40 MG tablet [id number] Yes TAKE 1 TABLET BY MOUTH EVERY DAY Karamalegos, [name], DO Taking Active   ?clonazePAM (KLONOPIN) 0.5 MG tablet [id number] Yes TAKE 1 TABLET(0.5 MG) BY MOUTH EVERY OTHER DAY AS NEEDED FOR ANXIETY Parks Ranger [name], DO Taking Active   ?DULoxetine (CYMBALTA) 20 MG capsule [id number] Yes Take 1 capsule (20 mg total) by mouth daily. [name], DO Taking Active   ?FLUoxetine (PROZAC) 20 MG capsule [id number] Yes Take 3 capsules (60 mg total) by mouth daily. [name], DO Taking Active   ?furosemide (LASIX) 20 MG tablet [id number] Yes Take 2 tablets (40 mg total) by mouth daily. If swelling is less, can take only 1 pill.  [name], DO Taking Active   ?gabapentin (NEURONTIN) 300 MG capsule [id number] Yes Take 2 capsules (600 mg total) by mouth 3 (three) times daily.  ?Patient taking differently: Take 300 mg by mouth 3 (three) times daily.  ? [name], MD Taking Active   ?Melatonin 3 MG CAPS [id number] Yes Take 10 mg by mouth at bedtime as needed (sleep).  [provider] Taking Active Self  ?metoprolol succinate (TOPROL-XL) 25 MG 24 hr tablet [id number] Yes TAKE 1/2 TABLET(12.5 MG) BY MOUTH DAILY Parks Ranger, [name], DO Taking Active   ?montelukast (SINGULAIR) 10 MG tablet [id number] No TAKE 1 TABLET(10 MG) BY MOUTH AT BEDTIME  ?Patient not taking: Reported on 05/11/2021  ? [name], DO Not Taking Active   ?multivitamin-iron-minerals-folic acid (CENTRUM) chewable tablet [id number] Yes Chew 1 tablet by mouth daily. [provider] Taking Active   ?potassium chloride (KLOR-CON) 10 MEQ tablet [id number] Yes Take 1 tablet (10 mEq total) by mouth daily. [name], DO Taking Active   ?QUEtiapine (SEROQUEL) 100 MG tablet [id number] Yes Take 1 tablet (100 mg total) by mouth at bedtime. [name], DO Taking Active   ?TRELEGY ELLIPTA 200-62.5-25 MCG/ACT AEPB [id number] Yes INHALE 1 PUFF INTO THE LUNGS DAILY [name], MD Taking Active   ?Med List Note Rise Patience, RN 02/12/18 1055): Medication agreement signed 02/12/2018 ?UDS 02/24/17 ?03/25/17 PA requested for gabapentin sent SB ?  ? ?  ?  ? ?  ? ? ?  Pertinent Labs:  ? ?Lab Results  ?Component Value Date  ? CHOL 135 04/10/2018  ? HDL 59 04/10/2018  ? [place] 64 04/10/2018  ? TRIG 50 04/10/2018  ? CHOLHDL 2.3 04/10/2018  ? ?Lab Results  ?Component Value Date  ? CREATININE 1.10 (H) 01/01/2021  ? BUN 19 01/01/2021  ? NA 143 01/01/2021  ? K 3.6 01/01/2021  ? CL 102 01/01/2021  ? CO2 29 01/01/2021  ? ?BP Readings from Last 3 Encounters:  ?02/21/21 (!) 126/59  ?01/01/21 124/68  ?11/17/20 130/70  ? ?Pulse  Readings from Last 3 Encounters:  ?02/21/21 80  ?01/01/21 69  ?11/17/20 69  ? ? ?SDOH:  (Social Determinants of Health) assessments and interventions performed:  ? ? ?CCM Care Plan ? ?Review of patient past medical history, allergies, medications, health status, including review of consultants reports, laboratory and other test data, was performed as part of comprehensive evaluation and provision of chronic care management services.  ? ?Care Plan : PharmD - Med Mgmt  ?Updates made by Rennis Petty, RPH-CPP since 05/11/2021 12:00 AM  ?  ? ?Problem: Disease Progression   ?  ? ?Long-Range Goal: Disease Progression Prevented or Minimized   ?Start Date: 04/10/2020  ?Expected End Date: 07/09/2020  ?Recent Progress: On track  ?Priority: High  ?Note:   ?Current Barriers:  ?Chronic Disease Management support, education, and care coordination needs related to previous CVA, COPD, hypertension, hyperlipidemia, depression ? ?Pharmacist Clinical Goal(s):  ?Over the next 90 days, patient will achieve adherence to monitoring guidelines and medication adherence to achieve therapeutic efficacy through collaboration with PharmD and provider.  ? ?Interventions: ?1:1 collaboration with [name], DO regarding development and update of comprehensive plan of care as evidenced by provider attestation and co-signature ?Inter-disciplinary care team collaboration (see longitudinal plan of care) ?Today patient reports recently shaky/having trouble with balance.  ?Reports shaking in hands is visible to others.  ?Had a fall a couple of weeks ago when taking out the trash - felt off balance at the time. Denies any injury from the fall.  ?Reports has checked home BP and denies BP related.  ?Reports shaking improves when she takes her clonazepam.  ?Denies noticing anything in particular that makes symptoms worse.  ?Discussed fall prevention ?Advise patient to call office today to schedule appointment with PCP for  evaluation ?Appointment scheduled for 4/18 ?Will collaborate with PCP ?Comprehensive medication review performed; medication list updated in electronic medical record ?Patient reports previously missed call with CCM Education officer, museum. Provide patient with number to CCM Social Worker as requested ? ?Moderate to Severe COPD:  ?Patient followed by [facility] in [place] ?Current treatment: ?Trelegy 1 puff daily , rinse after use ?Patient confirms rinsing after use.  ?Albuterol inhaler as needed ?Overnight oxygen as directed by Pulmonology ? ?Hypertension ?Reports taking: ?Furosemide 20 mg - 2 tablets (40 mg) daily ?Potassium chloride 10 mEq daily ?Metoprolol ER 25 mg - 1/2 tablet (12.5 mg ) daily ?Reports swelling has improved ?Upcoming visit with [facility] scheduled for 5/12 ?Reports checked blood pressure today: 124/64, HR 69  ?Encourage patient monitor home blood pressure, and keep log of BP and bring record to medical appointments ?Encourage patient to continue to monitor home weights and bring this record to medical appointments ? ? ?Patient Goals/Self-Care Activities ?Over the next 90 days, patient will:  ?To self administers medications as prescribed ?Encourage patient to obtain weekly pillbox to use as adherence tool ?Attends all scheduled provider appointments ?Calls  pharmacy for medication refills ?Calls provider office for new concerns or questions ? ?Follow Up Plan: CM Pharmacist will outreach to patient by telephone next on 6/14 at 10 am ? ?  ?  ? ?Wallace Cullens, PharmD, BCACP, CPP ?Clinical Pharmacist ?[facility] ?[place] ?[phone] ? ? ? ? ? ?

## 2021-05-11 NOTE — Patient Instructions (Signed)
Visit Information ? ?Thank you for taking time to visit with me today. Please don't hesitate to contact me if I can be of assistance to you before our next scheduled telephone appointment. ? ?Following are the goals we discussed today:  ? Goals Addressed   ? ?  ?  ?  ?  ? This Visit's Progress  ?  Pharmacy Goals     ?  Our goal bad cholesterol, or LDL, is less than 70. This is why it is important to continue taking your atorvastatin ? ?Please check your home blood pressure and weights, keep a log of the results and bring this with you to your medical appointments. ? ?Feel free to call me with any questions or concerns. I look forward to our next call! ? ? ?Wallace Cullens, PharmD, BCACP ?Clinical Pharmacist ?[facility] ?[place] ?[phone] ?  ? ?  ? ? ? ?Our next appointment is by telephone on 6/14 at 10 am ? ?Please call the care guide team at [phone] if you need to cancel or reschedule your appointment.  ? ? ?The patient verbalized understanding of instructions, educational materials, and care plan provided today and declined offer to receive copy of patient instructions, educational materials, and care plan.  ? ?

## 2021-05-11 NOTE — Telephone Encounter (Signed)
Call disconnected and NT called patient back . ?Chief Complaint: fell last week ?Symptoms: losing balance more often. Loss of balance taking trash to dump and walking dog on leash and fell. Reported lightheadedness but denies now. Hx stroke left side weakness. Reports pain in back, legs and arms  ?Frequency: last week ?Pertinent Negatives: Patient denies lightheadedness, no dizziness, no injury from fall ?Disposition: '[]'$ ED /'[]'$ Urgent Care (no appt availability in office) / '[x]'$ Appointment(In office/virtual)/ '[]'$  [place] [facility]/ '[]'$ Home Care/ '[]'$ Refused Recommended Disposition /'[]'$ [place] [facility]/ '[]'$  Follow-up with PCP ?Additional Notes:  ? ?Please advise if earlier appt needed. Appt scheduled for 05/15/21.  ? ? Reason for Disposition ? [1] Falling (two or more falls) AND [2] in past year ? ?Answer Assessment - Initial Assessment Questions ?1. MECHANISM: "How did the fall happen?" ?    Taking  trash to dump and dog on leash and lost balance and fell ?2. DOMESTIC VIOLENCE AND ELDER ABUSE SCREENING: "Did you fall because someone pushed you or tried to hurt you?" If Yes, ask: "Are you safe now?" ?    na ?3. ONSET: "When did the fall happen?" (e.g., minutes, hours, or days ago) ?    Last week  ?4. LOCATION: "What part of the body hit the ground?" (e.g., back, buttocks, head, hips, knees, hands, head, stomach) ?    Not sure fell in the grass ?5. INJURY: "Did you hurt (injure) yourself when you fell?" If Yes, ask: "What did you injure? Tell me more about this?" (e.g., body area; type of injury; pain severity)" ?    Denies injury .  ?6. PAIN: "Is there any pain?" If Yes, ask: "How bad is the pain?" (e.g., Scale 1-10; or mild,  ?moderate, severe) ?  - NONE (0): No pain ?  - MILD (1-3): Doesn't interfere with normal activities  ?  - MODERATE (4-7): Interferes with normal activities or awakens from sleep  ?  - SEVERE (8-10): Excruciating pain, unable to do any normal activities  ?    Pain in back and arms and legs   ?7. SIZE: For cuts, bruises, or swelling, ask: "How large is it?" (e.g., inches or centimeters)  ?    Denies  ?8. PREGNANCY: "Is there any chance you are pregnant?" "When was your last menstrual period?" ?    na ?9. OTHER SYMPTOMS: "Do you have any other symptoms?" (e.g., dizziness, fever, weakness; new onset or worsening).  ?    Loss  balance  lightheaded last week. Shakiness in hands. Left side weak from previous stroke  ?10. CAUSE: "What do you think caused the fall (or falling)?" (e.g., tripped, dizzy spell) ?      Loss of balance and walking dog on leash and fell ? ?Protocols used: Falls and Falling-A-AH ? ?

## 2021-05-14 ENCOUNTER — Telehealth: Payer: Medicare Other

## 2021-05-14 ENCOUNTER — Ambulatory Visit: Payer: Self-pay

## 2021-05-14 ENCOUNTER — Other Ambulatory Visit: Payer: Self-pay | Admitting: Family Medicine

## 2021-05-14 DIAGNOSIS — J449 Chronic obstructive pulmonary disease, unspecified: Secondary | ICD-10-CM

## 2021-05-14 DIAGNOSIS — M7989 Other specified soft tissue disorders: Secondary | ICD-10-CM

## 2021-05-14 DIAGNOSIS — E785 Hyperlipidemia, unspecified: Secondary | ICD-10-CM

## 2021-05-14 DIAGNOSIS — F411 Generalized anxiety disorder: Secondary | ICD-10-CM

## 2021-05-14 DIAGNOSIS — F332 Major depressive disorder, recurrent severe without psychotic features: Secondary | ICD-10-CM

## 2021-05-14 DIAGNOSIS — I10 Essential (primary) hypertension: Secondary | ICD-10-CM

## 2021-05-14 DIAGNOSIS — F339 Major depressive disorder, recurrent, unspecified: Secondary | ICD-10-CM

## 2021-05-14 DIAGNOSIS — Z9181 History of falling: Secondary | ICD-10-CM

## 2021-05-14 DIAGNOSIS — I5032 Chronic diastolic (congestive) heart failure: Secondary | ICD-10-CM

## 2021-05-14 DIAGNOSIS — M545 Low back pain, unspecified: Secondary | ICD-10-CM

## 2021-05-14 NOTE — Patient Instructions (Signed)
Visit Information ? ?Thank you for taking time to visit with me today. Please don't hesitate to contact me if I can be of assistance to you before our next scheduled telephone appointment. ? ?Following are the goals we discussed today:  ?RNCM Clinical Goal(s):  ?Patient will verbalize basic understanding of CHF, HTN, HLD, COPD, Anxiety, Depression, and Chronic Pain disease process and self health management plan as evidenced by keeping appointments, following the plan of care as prescribed by the providers, and working with the CCM team to optimize health and well being ?take all medications exactly as prescribed and will call provider for medication related questions as evidenced by compliance with medications and calling for refills before running out of medications    ?attend all scheduled medical appointments: saw pcp in January.  Knows to call for changes or needs  as evidenced by keeping appointments and calling for schedule change needs        ?demonstrate improved and ongoing adherence to prescribed treatment plan for CHF, HTN, HLD, COPD, Anxiety, Depression, and chronic pain  as evidenced by stable conditions, no exacerbations of CHF or COPD, mental health conditions stable, VS stable, routine MD visits and lab work and working with the providers and CCM team to optimize health and well being ?continue to work with Consulting civil engineer and/or Education officer, museum to address care management and care coordination needs related to CHF, HTN, HLD, COPD, Anxiety, Depression, and chronic pain  as evidenced by adherence to CM Team Scheduled appointments     ?work with pharmacist to address medication reconciliation and needs related to CHF, HTN, HLD, COPD, Anxiety, Depression, and chronic pain as evidenced by review of EMR and patient or pharmacist report    ?work with Education officer, museum to address [place] Concerns  related to the management of Anxiety and Depression as evidenced by review of EMR and patient or social worker  report     ?demonstrate ongoing self health care management ability effective management of chronic conditions as evidenced by working with the CCM team through collaboration with Consulting civil engineer, provider, and care team.  ?  ?Interventions: ?1:1 collaboration with primary care provider regarding development and update of comprehensive plan of care as evidenced by provider attestation and co-signature ?Inter-disciplinary care team collaboration (see longitudinal plan of care) ?Evaluation of current treatment plan related to  self management and patient's adherence to plan as established by provider ?  ?  ?Heart Failure Interventions:  (Status: Goal on Track (progressing): YES.)  Long Term Goal  ?   ?Wt Readings from Last 3 Encounters:  ?02/21/21 164 lb (74.4 kg)  ?01/01/21 159 lb 12.8 oz (72.5 kg)  ?11/17/20 157 lb (71.2 kg)  ?  ?Basic overview and discussion of pathophysiology of Heart Failure reviewed. 03-05-2021: The patient has fluctuations in weights at times. Has been instructed to take 2 of the Lasix currently for additional fluid on board. Was in the ER in January for chest tightness. The patient states she was COVID positive and that was what was going on. Education and support provided. 05-14-2021: The patient states that her weight is better since she is taking 2 of the lasix now. She states the edema is there but not as bad. Review of elevation of feet and legs when sitting. The patient sees vascular provider on 06-08-2021 and pcp tomorrow.  ?Provided education on low sodium diet. 05-14-2021: Review of heart healthy diet and to monitor for hidden sodium in foods. The patient tries to  be mindful of dietary intake ?Reviewed Heart Failure Action Plan in depth and provided written copy ?Assessed need for readable accurate scales in home ?Provided education about placing scale on hard, flat surface ?Advised patient to weigh each morning after emptying bladder ?Discussed importance of daily weight and advised  patient to weigh and record daily ?Reviewed role of diuretics in prevention of fluid overload and management of heart failure ?Discussed the importance of keeping all appointments with provider. Has follow up with the pcp on 05-15-2021 and vascular provider on 06-08-2021. Saw cardiologist in December of 2022. The patient aware of calling for changes in CHF or other chronic conditions ?Provided patient with education about the role of exercise in the management of heart failure ?Advised patient to discuss changes in weight, plus 2-3 in one day or 5 in one week, concerns for management of HF, complex conditions related to HF and COPD exacerbations at times with provider ?Screening for signs and symptoms of depression related to chronic disease state  ?Assessed social determinant of health barriers ?  ?COPD: (Status: Goal on Track (progressing): YES.) Long Term Goal  ?Reviewed medications with patient, including use of prescribed maintenance and rescue inhalers, and provided instruction on medication management and the importance of adherence. 03-05-2021: The patient is compliant with medications. The patient states that she had COVID and finished her last prednisone today. The patient is monitoring for changes in conditions. 05-14-2021: The patient is doing well and denies any issues with COPD or any acute exacerbations.  ?Provided patient with basic written and verbal COPD education on self care/management/and exacerbation prevention. 03-05-2021: Review of being safe and not going around others who are sick, being mindful of handwashing, and changes in weather that could cause exacerbation in conditions. The patient states she was COVID positive in January and got better but then got worse again. She states that she is feeling much better. Review of what worse looks like. 05-14-2021: The patient knows factors that cause exacerbation of COPD. Denies any new concerns today. ?Advised patient to track and manage COPD triggers.  05-14-2021: Review of triggers and discussed the patient monitoring for changes.  ?Provided written and verbal instructions on pursed lip breathing and utilized returned demonstration as teach back ?Provided instruction about proper use of medications used for management of COPD including inhalers ?Advised patient to self assesses COPD action plan zone and make appointment with provider if in the yellow zone for 48 hours without improvement ?Advised patient to engage in light exercise as tolerated 3-5 days a week to aid in the the management of COPD ?Provided education about and advised patient to utilize infection prevention strategies to reduce risk of respiratory infection. 05-14-2021: The patient knows factors that increase her risk of exacerbation. She is mindful of who she is around. Practices good hygiene and safety.  ?Discussed the importance of adequate rest and management of fatigue with COPD ?  ?Anxiety and Depression  (Status: Goal on Track (progressing): YES.) Long Term Goal  ?Evaluation of current treatment plan related to Anxiety and Depression, Mental Health Concerns  and the need for a new mental health provider  self-management and patient's adherence to plan as established by provider. ?Discussed plans with patient for ongoing care management follow up and provided patient with direct contact information for care management team. 05-14-2021: The patient states she is stable but she still has not secured a specialist to help her with her mental health needs. She needs someone local and cannot return to the  one at [facility] due to a misunderstanding and the doctor dismissing her for taking her husbands medications. The patient states she did not do this but states her husband told her that she had because she kept asking him the same questions. Empathetic listening and support given. The patient is also working with the LCSW on finding support for her mental health needs. Sees pcp tomorrow.  ?Advised  patient to call the office for changes in mood, anxiety, depression, or worsening mental health conditions; ?Provided education to patient re: continuing to reach out to the resources provided to her for ment

## 2021-05-14 NOTE — Chronic Care Management (AMB) (Signed)
?Chronic Care Management  ? ?CCM RN Visit Note ? ?05/14/2021 ?Name: Beth [name] MRN: [id number] DOB: 12-19-1945 ? ?Subjective: ?Beth [name] is a 76 y.o. year old female who is a primary care patient of [name], DO. The care management team was consulted for assistance with disease management and care coordination needs.   ? ?Engaged with patient by telephone for follow up visit in response to provider referral for case management and/or care coordination services.  ? ?Consent to Services:  ?The patient was given information about Chronic Care Management services, agreed to services, and gave verbal consent prior to initiation of services.  Please see initial visit note for detailed documentation.  ? ?Patient agreed to services and verbal consent obtained.  ? ?Assessment: Review of patient past medical history, allergies, medications, health status, including review of consultants reports, laboratory and other test data, was performed as part of comprehensive evaluation and provision of chronic care management services.  ? ?SDOH (Social Determinants of Health) assessments and interventions performed:   ? ?CCM Care Plan ? ?Allergies  ?Allergen Reactions  ? Bextra  [Valdecoxib]   ? Compazine [Prochlorperazine Edisylate]   ?  Stroke-like symptoms  ? Lithium Carbonate   ?  Leg weakness  ? Lyrica [Pregabalin]   ? ? ?Outpatient Encounter Medications as of 05/14/2021  ?Medication Sig  ? acetaminophen (TYLENOL) 500 MG tablet Take 500 mg by mouth 2 (two) times daily as needed.  ? albuterol (VENTOLIN HFA) 108 (90 Base) MCG/ACT inhaler Inhale into the lungs every 6 (six) hours as needed for wheezing or shortness of breath.  ? aspirin 81 MG chewable tablet Chew 81 mg by mouth daily.  ? atorvastatin (LIPITOR) 40 MG tablet TAKE 1 TABLET BY MOUTH EVERY DAY  ? clonazePAM (KLONOPIN) 0.5 MG tablet TAKE 1 TABLET(0.5 MG) BY MOUTH EVERY OTHER DAY AS NEEDED FOR ANXIETY  ? DULoxetine (CYMBALTA) 20 MG capsule Take 1  capsule (20 mg total) by mouth daily.  ? FLUoxetine (PROZAC) 20 MG capsule Take 3 capsules (60 mg total) by mouth daily.  ? furosemide (LASIX) 20 MG tablet Take 2 tablets (40 mg total) by mouth daily. If swelling is less, can take only 1 pill.  ? gabapentin (NEURONTIN) 300 MG capsule Take 2 capsules (600 mg total) by mouth 3 (three) times daily. (Patient taking differently: Take 300 mg by mouth 3 (three) times daily.)  ? Melatonin 3 MG CAPS Take 10 mg by mouth at bedtime as needed (sleep).   ? metoprolol succinate (TOPROL-XL) 25 MG 24 hr tablet TAKE 1/2 TABLET(12.5 MG) BY MOUTH DAILY  ? montelukast (SINGULAIR) 10 MG tablet TAKE 1 TABLET(10 MG) BY MOUTH AT BEDTIME (Patient not taking: Reported on 05/11/2021)  ? multivitamin-iron-minerals-folic acid (CENTRUM) chewable tablet Chew 1 tablet by mouth daily.  ? potassium chloride (KLOR-CON) 10 MEQ tablet Take 1 tablet (10 mEq total) by mouth daily.  ? QUEtiapine (SEROQUEL) 100 MG tablet Take 1 tablet (100 mg total) by mouth at bedtime.  ? TRELEGY ELLIPTA 200-62.5-25 MCG/ACT AEPB INHALE 1 PUFF INTO THE LUNGS DAILY  ? ?No facility-administered encounter medications on file as of 05/14/2021.  ? ? ?Patient Active Problem List  ? Diagnosis Date Noted  ? Chronic diastolic congestive heart failure ([place]) 11/14/2020  ? Rash 01/14/2020  ? Pulmonary hypertension ([place]) 09/14/2019  ? Coccydynia 07/29/2019  ? Cervicalgia 05/04/2019  ? Acute pain of left shoulder 05/04/2019  ? Elbow pain, left 05/04/2019  ? DOE (dyspnea on exertion) 03/23/2019  ?  Leg swelling 03/22/2019  ?  Class: Acute  ? Lumbar degenerative disc disease 09/10/2018  ? Left hip pain 09/10/2018  ? Post-traumatic osteoarthritis of left elbow 07/14/2018  ? Lumbar radiculopathy 02/12/2018  ? Lumbar spondylosis 02/12/2018  ? Lumbar facet arthropathy 02/12/2018  ? Atherosclerosis of aorta ([place]) 09/17/2017  ? Lung nodule 09/17/2017  ? Urinary tract infection 12/05/2016  ? Protein-calorie malnutrition, mild ([place]) 08/07/2016  ?  Generalized anxiety disorder 07/15/2016  ? Chronic pain syndrome 07/15/2016  ? Severe episode of recurrent major depressive disorder, without psychotic features ([place]) 07/14/2016  ? Moderate benzodiazepine use disorder ([place]) 05/08/2016  ? Major depressive disorder, recurrent, severe w/o psychotic behavior ([place]) 05/01/2016  ? Seasonal allergic rhinitis 03/23/2015  ? Hyperglycemia 03/23/2015  ? Senile purpura ([place]) 03/23/2015  ? Perennial allergic rhinitis with seasonal variation 03/23/2015  ? Marital problems 10/31/2014  ? Migraine without aura and without status migrainosus, not intractable 10/31/2014  ? Myofascial pain syndrome of lumbar spine 08/09/2014  ? Colon polyp 08/09/2014  ? COPD, severe ([place]) 08/09/2014  ? CVA, old, hemiparesis ([place]) 08/09/2014  ? Dyslipidemia 08/09/2014  ? Dysfunction of eustachian tube 08/09/2014  ? Fibromyalgia syndrome 08/09/2014  ? Gastro-esophageal reflux disease without esophagitis 08/09/2014  ? Benign migrating glossitis 08/09/2014  ? Cerebrovascular accident, old 08/09/2014  ? IBS (irritable bowel syndrome) 08/09/2014  ? Low back pain with radiation 08/09/2014  ? Chronic recurrent major depressive disorder ([place]) 08/09/2014  ? Dysmetabolic syndrome 38/10/1749  ? OP (osteoporosis) 08/09/2014  ? Vitamin D deficiency 08/09/2014  ? Smoking 08/09/2014  ? Benign hypertension 07/19/2013  ? Benign neoplasm of skin of trunk 06/03/2013  ? H/O: pneumonia 09/25/2012  ? ? ?Conditions to be addressed/monitored:CHF, HTN, HLD, COPD, Anxiety, Depression, and Chronic pain and falls prevention and safety ? ?Care Plan : RNCM: General Plan of Care (Adult) for Chronic Disease Management and Care Coordination Needs  ?Updates made by Vanita Ingles, RN since 05/14/2021 12:00 AM  ?  ? ?Problem: RNCM: Development of Plan of Care For Chronic Disease Management (HTN, HLD, HF, COPD, Depression, Anxiety, Chronic Pain)   ?Priority: High  ?  ? ?Long-Range Goal: RNCM: Effective Management  of Plan of Care For  Chronic Disease Management (HTN, HLD, HF, COPD, Depression, Anxiety, Chronic Pain)   ?Start Date: 03/05/2021  ?Expected End Date: 03/05/2022  ?Priority: High  ?Note:   ?Current Barriers:  ?Knowledge Deficits related to plan of care for management of CHF, HTN, HLD, COPD, Chronic Pain, and Depression and Axiety  ?Chronic Disease Management support and education needs related to CHF, HTN, HLD, COPD, Chronic Pain, and depression and anxiety ?Lacks caregiver support.        ? ?RNCM Clinical Goal(s):  ?Patient will verbalize basic understanding of CHF, HTN, HLD, COPD, Anxiety, Depression, and Chronic Pain disease process and self health management plan as evidenced by keeping appointments, following the plan of care as prescribed by the providers, and working with the CCM team to optimize health and well being ?take all medications exactly as prescribed and will call provider for medication related questions as evidenced by compliance with medications and calling for refills before running out of medications    ?attend all scheduled medical appointments: saw pcp in January.  Knows to call for changes or needs  as evidenced by keeping appointments and calling for schedule change needs        ?demonstrate improved and ongoing adherence to prescribed treatment plan for CHF, HTN, HLD, COPD, Anxiety, Depression, and chronic pain  as evidenced by stable conditions, no exacerbations of CHF or COPD, mental health conditions stable, VS stable, routine MD visits and lab work and working with the providers and CCM team to optimize health and well being ?continue to work with Consulting civil engineer and/or Education officer, museum to address care management and care coordination needs related to CHF, HTN, HLD, COPD, Anxiety, Depression, and chronic pain  as evidenced by adherence to CM Team Scheduled appointments     ?work with pharmacist to address medication reconciliation and needs related to CHF, HTN, HLD, COPD, Anxiety, Depression, and chronic pain as  evidenced by review of EMR and patient or pharmacist report    ?work with Education officer, museum to address [place] Concerns  related to the management of Anxiety and Depression as evidenced by review of EMR an

## 2021-05-15 ENCOUNTER — Ambulatory Visit (INDEPENDENT_AMBULATORY_CARE_PROVIDER_SITE_OTHER): Payer: Medicare Other | Admitting: Family Medicine

## 2021-05-15 ENCOUNTER — Encounter: Payer: Self-pay | Admitting: Family Medicine

## 2021-05-15 VITALS — BP 124/68 | HR 81 | Ht 65.0 in | Wt 152.2 lb

## 2021-05-15 DIAGNOSIS — F332 Major depressive disorder, recurrent severe without psychotic features: Secondary | ICD-10-CM | POA: Diagnosis not present

## 2021-05-15 DIAGNOSIS — Z23 Encounter for immunization: Secondary | ICD-10-CM

## 2021-05-15 DIAGNOSIS — R42 Dizziness and giddiness: Secondary | ICD-10-CM | POA: Diagnosis not present

## 2021-05-15 DIAGNOSIS — R296 Repeated falls: Secondary | ICD-10-CM

## 2021-05-15 NOTE — Progress Notes (Signed)
? ?Subjective:  ? ? Patient ID: Beth [name], female    DOB: 1945-07-09, 76 y.o.   MRN: [id number] ? ?Beth [name] is a 76 y.o. female presenting on 05/15/2021 for Fall and Dizziness ? ? ?HPI ? ?Major Depression recurrent severe ?Generalized Anxiety Disorder ?Insomnia ?Chronic problems. ? ?Dizziness / fall ? ?Reports onset 2-3 weeks, having persistent dizziness, and some tremors within arms ?Not having fever or temperature fluctuation ?BP has been controlled ?Unable to get into Geriatric Psych ?She takes Clonazepam 0.46m every other day with some relief on tremors ?She had similar issues on Duloxetine in the past, had to come off. Now has been on Duloxetine since early 2022, and still has several pills left, interested to come off again ?Taking Fluoxetine 25mx 3 = 607maily. Newly ordered today ?Continues Seroquel 100m3mghtly ? ? ? ? ?  02/21/2021  ?  2:33 PM 11/14/2020  ?  2:35 PM 09/25/2020  ?  9:08 AM  ?Depression screen PHQ 2/9  ?Decreased Interest 1 1 0  ?Down, Depressed, Hopeless 1 1 0  ?PHQ - 2 Score 2 2 0  ?Altered sleeping 0 0   ?Tired, decreased energy 1 2   ?Change in appetite 1 1   ?Feeling bad or failure about yourself  2 2   ?Trouble concentrating 0 2   ?Moving slowly or fidgety/restless 0 1   ?Suicidal thoughts 0 0   ?PHQ-9 Score 6 10   ?Difficult doing work/chores Somewhat difficult Somewhat difficult   ? ? ?Social History  ? ?Tobacco Use  ? Smoking status: Former  ?  Packs/day: 0.25  ?  Years: 57.00  ?  Pack years: 14.25  ?  Types: Cigarettes  ?  Quit date: 10/24/2020  ?  Years since quitting: 0.5  ? Smokeless tobacco: Never  ? Tobacco comments:  ?  6-8cig daily--08/28/2020  ?Vaping Use  ? Vaping Use: Never used  ?Substance Use Topics  ? Alcohol use: No  ?  Alcohol/week: 0.0 standard drinks  ? Drug use: No  ? ? ?Review of Systems ?Per HPI unless specifically indicated above ? ?   ?Objective:  ?  ?BP 124/68   Pulse 81   Ht '5\' 5"'  (1.651 m)   Wt 152 lb 3.2 oz (69 kg)   SpO2 95%   BMI 25.33  kg/m?   ?Wt Readings from Last 3 Encounters:  ?05/15/21 152 lb 3.2 oz (69 kg)  ?02/21/21 164 lb (74.4 kg)  ?01/01/21 159 lb 12.8 oz (72.5 kg)  ?  ?Physical Exam ?Vitals and nursing note reviewed.  ?Constitutional:   ?   General: She is not in acute distress. ?   Appearance: Normal appearance. She is well-developed. She is not diaphoretic.  ?   Comments: Well-appearing, comfortable, cooperative  ?HENT:  ?   Head: Normocephalic and atraumatic.  ?Eyes:  ?   General:     ?   Right eye: No discharge.     ?   Left eye: No discharge.  ?   Conjunctiva/sclera: Conjunctivae normal.  ?Cardiovascular:  ?   Rate and Rhythm: Normal rate.  ?Pulmonary:  ?   Effort: Pulmonary effort is normal.  ?Skin: ?   General: Skin is warm and dry.  ?   Findings: No erythema or rash.  ?Neurological:  ?   Mental Status: She is alert and oriented to person, place, and time.  ?   Comments: No tremors at baseline at rest  ?Psychiatric:     ?  Mood and Affect: Mood normal.     ?   Behavior: Behavior normal.     ?   Thought Content: Thought content normal.  ?   Comments: Well groomed, good eye contact, normal speech and thoughts  ? ? ? ?Results for orders placed or performed in visit on 01/01/21  ?Basic metabolic panel  ?Result Value Ref Range  ? Glucose 111 (H) 70 - 99 mg/dL  ? BUN 19 8 - 27 mg/dL  ? Creatinine, Ser 1.10 (H) 0.57 - 1.00 mg/dL  ? eGFR 52 (L) >59 mL/min/1.73  ? BUN/Creatinine Ratio 17 12 - 28  ? Sodium 143 134 - 144 mmol/L  ? Potassium 3.6 3.5 - 5.2 mmol/L  ? Chloride 102 96 - 106 mmol/L  ? CO2 29 20 - 29 mmol/L  ? Calcium 9.3 8.7 - 10.3 mg/dL  ? ?   ?Assessment & Plan:  ? ?Problem List Items Addressed This Visit   ? ? Severe episode of recurrent major depressive disorder, without psychotic features ([place])  ? ?Other Visit Diagnoses   ? ? Recurrent falls    -  Primary  ? Dizziness      ? ?  ?  ?Chronic mental health problems ?Recurrent depression / GAD / insomnia ? ?Still waiting on geri psych age 57+ unable to identify provider she can  go to, she has worked with CCM team and LCSW ? ?Currently concern with polypharmacy, and risk of duplicate therapy at risk serotonin syndrome but current symptoms not entirely consistent with that. ? ?Will discontinue Duloxetine at this time. And continue SSRI Fluoxetine 76m x 3 = 649mdaily ? ?Review PDMP, for Clonazepam ?Continue Quetiapine ? ?Recurrent Falls ?Attributed to dizziness and can be component of deconditioning too, will order handwritten rx Rollator walker w wheels and seat, she can go to med supply store. This would help her daily mobility function for ADLs to limit falls ? ?No orders of the defined types were placed in this encounter. ? ? ? ? ?Follow up plan: ?Return in about 3 months (around 08/14/2021). ? ?AlNobie PutnamDO ?[facility] ?05/15/2021, 2:02 PM ?

## 2021-05-15 NOTE — Telephone Encounter (Signed)
Requested Prescriptions  ?Pending Prescriptions Disp Refills  ?? FLUoxetine (PROZAC) 20 MG capsule [Pharmacy Med Name: FLUOXETINE '20MG'$  CAPSULES] 270 capsule 0  ?  Sig: TAKE 3 CAPSULES(60 MG) BY MOUTH DAILY  ?  ? Psychiatry:  Antidepressants - SSRI Passed - 05/14/2021  5:21 PM  ?  ?  Passed - Completed PHQ-2 or PHQ-9 in the last 360 days  ?  ?  Passed - Valid encounter within last 6 months  ?  Recent Outpatient Visits   ?      ? 2 months ago Chronic diastolic congestive heart failure ([place])  ? [place], DO  ? 6 months ago Major depressive disorder, recurrent, severe w/o psychotic behavior ([place])  ? [place], DO  ? 1 year ago Major depressive disorder, recurrent, severe w/o psychotic behavior ([place])  ? [place], DO  ? 1 year ago Rash  ? [place], FNP  ? 1 year ago Benign hypertension  ? [facility], Lupita Raider, FNP  ?  ?  ?Future Appointments   ?        ? Today Parks Ranger, [name], DO [facility], [place]  ? In 1 month Agbor-Etang, Aaron Edelman, MD [facility], [facility]  ? In 4 months  [facility], [place]  ?  ? ?  ?  ?  ? ?

## 2021-05-15 NOTE — Patient Instructions (Addendum)
Thank you for coming to the office today. ? ?Taper off Duloxetine '20mg'$  daily to help reduce tremors and dizziness. ? ?For a walker ? ?[facility] ?[place], [place] [address] ?Ph: [phone] ?Fax: [phone] ? ?[facility] ?[address] ?[place], [place] [address] ?Ph: [phone] ? ?[facility] ?[address] ?[place], [place] [address] ?Open until 5PM ?Phone: [phone] ?Fax: [phone] ? ?[facility] ?[place] ?[place], [place] [address] ?Ph [phone] ?Fax [phone] ? ? ?Please schedule a Follow-up Appointment to: Return in about 3 months (around 08/14/2021) for 3 month follow-up . ? ?If you have any other questions or concerns, please feel free to call the office or send a message through [place]. You may also schedule an earlier appointment if necessary. ? ?Additionally, you may be receiving a survey about your experience at our office within a few days to 1 week by e-mail or mail. We value your feedback. ? ?Nobie Putnam, DO ?[place] ?

## 2021-05-19 ENCOUNTER — Other Ambulatory Visit: Payer: Self-pay | Admitting: Family Medicine

## 2021-05-19 DIAGNOSIS — I10 Essential (primary) hypertension: Secondary | ICD-10-CM

## 2021-05-21 NOTE — Telephone Encounter (Signed)
Requested Prescriptions  ?Pending Prescriptions Disp Refills  ?? metoprolol succinate (TOPROL-XL) 25 MG 24 hr tablet [Pharmacy Med Name: METOPROLOL ER SUCCINATE '25MG'$  TABS] 45 tablet 0  ?  Sig: TAKE 1/2 TABLET(12.5 MG) BY MOUTH DAILY  ?  ? Cardiovascular:  Beta Blockers Passed - 05/19/2021 10:03 AM  ?  ?  Passed - Last BP in normal range  ?  BP Readings from Last 1 Encounters:  ?05/15/21 124/68  ?   ?  ?  Passed - Last Heart Rate in normal range  ?  Pulse Readings from Last 1 Encounters:  ?05/15/21 81  ?   ?  ?  Passed - Valid encounter within last 6 months  ?  Recent Outpatient Visits   ?      ? 6 days ago Recurrent falls  ? [place], DO  ? 2 months ago Chronic diastolic congestive heart failure ([place])  ? [place], DO  ? 6 months ago Major depressive disorder, recurrent, severe w/o psychotic behavior ([place])  ? [place], DO  ? 1 year ago Major depressive disorder, recurrent, severe w/o psychotic behavior ([place])  ? [place], DO  ? 1 year ago Rash  ? [facility], Lupita Raider, FNP  ?  ?  ?Future Appointments   ?        ? In 1 month Agbor-Etang, Aaron Edelman, MD [facility], [facility]  ? In 4 months  [facility], [place]  ?  ? ?  ?  ?  ? ?

## 2021-05-25 ENCOUNTER — Telehealth: Payer: Self-pay

## 2021-05-25 DIAGNOSIS — F332 Major depressive disorder, recurrent severe without psychotic features: Secondary | ICD-10-CM

## 2021-05-25 NOTE — Telephone Encounter (Signed)
Copied from [place] [id number]. Topic: Referral - Request for Referral ?>> May 24, 2021  4:12 PM [place] wrote: ? Pt states that Dr Raliegh Ip is aware that she has been try to find a Psychiatrist. She would like him to refer her to  Dr Erenest Blank / [address], [place], [place]     [phone] . She states this is all the info she has as she called the insurance co to get this. ?

## 2021-05-25 NOTE — Telephone Encounter (Signed)
Referral sent as requested ? ?Nobie Putnam, DO ?[facility] ?[facility] ?05/25/2021, 1:02 PM ? ?

## 2021-05-27 DIAGNOSIS — J441 Chronic obstructive pulmonary disease with (acute) exacerbation: Secondary | ICD-10-CM | POA: Diagnosis not present

## 2021-05-31 ENCOUNTER — Other Ambulatory Visit: Payer: Self-pay | Admitting: Family Medicine

## 2021-05-31 DIAGNOSIS — F332 Major depressive disorder, recurrent severe without psychotic features: Secondary | ICD-10-CM

## 2021-05-31 NOTE — Telephone Encounter (Signed)
Requested medication (s) are due for refill today:   No ? ?Requested medication (s) are on the active medication list:   No.   Discontinued 06/02/2020 because not covered by insurance  - Dr. Parks Ranger ? ?Future visit scheduled:   Yes ? ? ?Last ordered: Discontinued 06/02/2020 ? ?Returned because this is not on her med list however a refill request was submitted.    ? ?Requested Prescriptions  ?Pending Prescriptions Disp Refills  ? FLUoxetine HCl 60 MG TABS [Pharmacy Med Name: FLUOXETINE '60MG'$  TABLETS] 90 tablet 1  ?  Sig: TAKE 1 TABLET BY MOUTH DAILY  ?  ? Psychiatry:  Antidepressants - SSRI Passed - 05/31/2021 10:03 AM  ?  ?  Passed - Completed PHQ-2 or PHQ-9 in the last 360 days  ?  ?  Passed - Valid encounter within last 6 months  ?  Recent Outpatient Visits   ? ?      ? 2 weeks ago Recurrent falls  ? [place], DO  ? 3 months ago Chronic diastolic congestive heart failure ([place])  ? [place], DO  ? 6 months ago Major depressive disorder, recurrent, severe w/o psychotic behavior ([place])  ? [place], DO  ? 1 year ago Major depressive disorder, recurrent, severe w/o psychotic behavior ([place])  ? [place], DO  ? 1 year ago Rash  ? [facility], Lupita Raider, FNP  ? ?  ?  ?Future Appointments   ? ?        ? In 1 month Agbor-Etang, Aaron Edelman, MD [facility], [facility]  ? In 4 months  [facility], [place]  ? ?  ? ? ?  ?  ?  ? ?

## 2021-06-08 ENCOUNTER — Ambulatory Visit (INDEPENDENT_AMBULATORY_CARE_PROVIDER_SITE_OTHER): Payer: Medicare Other | Admitting: Vascular Surgery

## 2021-06-08 ENCOUNTER — Other Ambulatory Visit (INDEPENDENT_AMBULATORY_CARE_PROVIDER_SITE_OTHER): Payer: Self-pay | Admitting: Vascular Surgery

## 2021-06-08 ENCOUNTER — Encounter (INDEPENDENT_AMBULATORY_CARE_PROVIDER_SITE_OTHER): Payer: Self-pay

## 2021-06-08 ENCOUNTER — Ambulatory Visit (INDEPENDENT_AMBULATORY_CARE_PROVIDER_SITE_OTHER): Payer: Medicare Other

## 2021-06-08 DIAGNOSIS — I7090 Unspecified atherosclerosis: Secondary | ICD-10-CM

## 2021-06-12 ENCOUNTER — Encounter (INDEPENDENT_AMBULATORY_CARE_PROVIDER_SITE_OTHER): Payer: Medicare Other | Admitting: Vascular Surgery

## 2021-06-14 ENCOUNTER — Other Ambulatory Visit: Payer: Self-pay | Admitting: Family Medicine

## 2021-06-14 DIAGNOSIS — J302 Other seasonal allergic rhinitis: Secondary | ICD-10-CM

## 2021-06-14 NOTE — Telephone Encounter (Signed)
Requested Prescriptions  Pending Prescriptions Disp Refills  . montelukast (SINGULAIR) 10 MG tablet [Pharmacy Med Name: MONTELUKAST '10MG'$  TABLETS] 90 tablet 0    Sig: TAKE 1 TABLET(10 MG) BY MOUTH AT BEDTIME     Pulmonology:  Leukotriene Inhibitors Passed - 06/14/2021  4:24 PM      Passed - Valid encounter within last 12 months    Recent Outpatient Visits          1 month ago Recurrent falls   [place], DO   3 months ago Chronic diastolic congestive heart failure [facility])   [place], DO   7 months ago Major depressive disorder, recurrent, severe w/o psychotic behavior ([place])   [place], DO   1 year ago Major depressive disorder, recurrent, severe w/o psychotic behavior ([place])   [facility] [name], DO   1 year ago [facility], Lupita Raider, FNP      Future Appointments            In 3 weeks Agbor-Etang, Aaron Edelman, MD [facility], [facility]   In 3 months  [facility], [facility]

## 2021-06-26 DIAGNOSIS — J441 Chronic obstructive pulmonary disease with (acute) exacerbation: Secondary | ICD-10-CM | POA: Diagnosis not present

## 2021-07-02 ENCOUNTER — Telehealth: Payer: Self-pay

## 2021-07-02 ENCOUNTER — Telehealth: Payer: Medicare Other

## 2021-07-02 ENCOUNTER — Ambulatory Visit (INDEPENDENT_AMBULATORY_CARE_PROVIDER_SITE_OTHER): Payer: Medicare Other

## 2021-07-02 DIAGNOSIS — F411 Generalized anxiety disorder: Secondary | ICD-10-CM

## 2021-07-02 DIAGNOSIS — I10 Essential (primary) hypertension: Secondary | ICD-10-CM

## 2021-07-02 DIAGNOSIS — R296 Repeated falls: Secondary | ICD-10-CM

## 2021-07-02 DIAGNOSIS — Z9181 History of falling: Secondary | ICD-10-CM

## 2021-07-02 DIAGNOSIS — E785 Hyperlipidemia, unspecified: Secondary | ICD-10-CM

## 2021-07-02 DIAGNOSIS — I5032 Chronic diastolic (congestive) heart failure: Secondary | ICD-10-CM

## 2021-07-02 DIAGNOSIS — M545 Low back pain, unspecified: Secondary | ICD-10-CM

## 2021-07-02 DIAGNOSIS — I272 Pulmonary hypertension, unspecified: Secondary | ICD-10-CM

## 2021-07-02 DIAGNOSIS — J449 Chronic obstructive pulmonary disease, unspecified: Secondary | ICD-10-CM

## 2021-07-02 DIAGNOSIS — F332 Major depressive disorder, recurrent severe without psychotic features: Secondary | ICD-10-CM

## 2021-07-02 DIAGNOSIS — F339 Major depressive disorder, recurrent, unspecified: Secondary | ICD-10-CM

## 2021-07-02 NOTE — Chronic Care Management (AMB) (Signed)
Chronic Care Management   CCM RN Visit Note  07/02/2021 Name: Beth [name] MRN: [id number] DOB: 1945-09-26  Subjective: Beth [name] is a 76 y.o. year old female who is a primary care patient of [name], DO. The care management team was consulted for assistance with disease management and care coordination needs.    Engaged with patient by telephone for follow up visit in response to provider referral for case management and/or care coordination services.   Consent to Services:  The patient was given information about Chronic Care Management services, agreed to services, and gave verbal consent prior to initiation of services.  Please see initial visit note for detailed documentation.   Patient agreed to services and verbal consent obtained.   Assessment: Review of patient past medical history, allergies, medications, health status, including review of consultants reports, laboratory and other test data, was performed as part of comprehensive evaluation and provision of chronic care management services.   SDOH (Social Determinants of Health) assessments and interventions performed:    CCM Care Plan  Allergies  Allergen Reactions   Bextra  [Valdecoxib]    Compazine [Prochlorperazine Edisylate]     Stroke-like symptoms   Lithium Carbonate     Leg weakness   Lyrica [Pregabalin]     Outpatient Encounter Medications as of 07/02/2021  Medication Sig   acetaminophen (TYLENOL) 500 MG tablet Take 500 mg by mouth 2 (two) times daily as needed.   albuterol (VENTOLIN HFA) 108 (90 Base) MCG/ACT inhaler Inhale into the lungs every 6 (six) hours as needed for wheezing or shortness of breath.   aspirin 81 MG chewable tablet Chew 81 mg by mouth daily.   atorvastatin (LIPITOR) 40 MG tablet TAKE 1 TABLET BY MOUTH EVERY DAY   clonazePAM (KLONOPIN) 0.5 MG tablet TAKE 1 TABLET(0.5 MG) BY MOUTH EVERY OTHER DAY AS NEEDED FOR ANXIETY   FLUoxetine (PROZAC) 20 MG capsule TAKE 3  CAPSULES(60 MG) BY MOUTH DAILY   furosemide (LASIX) 20 MG tablet Take 2 tablets (40 mg total) by mouth daily. If swelling is less, can take only 1 pill.   gabapentin (NEURONTIN) 300 MG capsule Take 2 capsules (600 mg total) by mouth 3 (three) times daily. (Patient taking differently: Take 300 mg by mouth 3 (three) times daily.)   Melatonin 3 MG CAPS Take 10 mg by mouth at bedtime as needed (sleep).    metoprolol succinate (TOPROL-XL) 25 MG 24 hr tablet TAKE 1/2 TABLET(12.5 MG) BY MOUTH DAILY   montelukast (SINGULAIR) 10 MG tablet TAKE 1 TABLET(10 MG) BY MOUTH AT BEDTIME   multivitamin-iron-minerals-folic acid (CENTRUM) chewable tablet Chew 1 tablet by mouth daily.   potassium chloride (KLOR-CON) 10 MEQ tablet Take 1 tablet (10 mEq total) by mouth daily.   QUEtiapine (SEROQUEL) 100 MG tablet Take 1 tablet (100 mg total) by mouth at bedtime.   TRELEGY ELLIPTA 200-62.5-25 MCG/ACT AEPB INHALE 1 PUFF INTO THE LUNGS DAILY   No facility-administered encounter medications on file as of 07/02/2021.    Patient Active Problem List   Diagnosis Date Noted   Chronic diastolic congestive heart failure ([place]) 11/14/2020   Rash 01/14/2020   Pulmonary hypertension ([place]) 09/14/2019   Coccydynia 07/29/2019   Cervicalgia 05/04/2019   Acute pain of left shoulder 05/04/2019   Elbow pain, left 05/04/2019   DOE (dyspnea on exertion) 03/23/2019   Leg swelling 03/22/2019    Class: Acute   Lumbar degenerative disc disease 09/10/2018   Left hip pain 09/10/2018   Post-traumatic  osteoarthritis of left elbow 07/14/2018   Lumbar radiculopathy 02/12/2018   Lumbar spondylosis 02/12/2018   Lumbar facet arthropathy 02/12/2018   Atherosclerosis of aorta ([place]) 09/17/2017   Lung nodule 09/17/2017   Urinary tract infection 12/05/2016   Protein-calorie malnutrition, mild (HCC) 08/07/2016   Generalized anxiety disorder 07/15/2016   Chronic pain syndrome 07/15/2016   Severe episode of recurrent major depressive disorder,  without psychotic features ([place]) 07/14/2016   Moderate benzodiazepine use disorder ([place]) 05/08/2016   Major depressive disorder, recurrent, severe w/o psychotic behavior ([place]) 05/01/2016   Seasonal allergic rhinitis 03/23/2015   Hyperglycemia 03/23/2015   Senile purpura ([place]) 03/23/2015   Perennial allergic rhinitis with seasonal variation 03/23/2015   Marital problems 10/31/2014   Migraine without aura and without status migrainosus, not intractable 10/31/2014   Myofascial pain syndrome of lumbar spine 08/09/2014   Colon polyp 08/09/2014   COPD, severe ([place]) 08/09/2014   CVA, old, hemiparesis ([place]) 08/09/2014   Dyslipidemia 08/09/2014   Dysfunction of eustachian tube 08/09/2014   Fibromyalgia syndrome 08/09/2014   Gastro-esophageal reflux disease without esophagitis 08/09/2014   Benign migrating glossitis 08/09/2014   Cerebrovascular accident, old 08/09/2014   IBS (irritable bowel syndrome) 08/09/2014   Low back pain with radiation 08/09/2014   Chronic recurrent major depressive disorder ([place]) 63/89/3734   Dysmetabolic syndrome 28/76/8115   OP (osteoporosis) 08/09/2014   Vitamin D deficiency 08/09/2014   Smoking 08/09/2014   Benign hypertension 07/19/2013   Benign neoplasm of skin of trunk 06/03/2013   H/O: pneumonia 09/25/2012    Conditions to be addressed/monitored:CHF, HTN, HLD, COPD, Anxiety, Depression, and chronic pain and falls.   Care Plan : RNCM: General Plan of Care (Adult) for Chronic Disease Management and Care Coordination Needs  Updates made by Vanita Ingles, RN since 07/02/2021 12:00 AM     Problem: RNCM: Development of Plan of Care For Chronic Disease Management (HTN, HLD, HF, COPD, Depression, Anxiety, Chronic Pain)   Priority: High     Long-Range Goal: RNCM: Effective Management  of Plan of Care For Chronic Disease Management (HTN, HLD, HF, COPD, Depression, Anxiety, Chronic Pain)   Start Date: 03/05/2021  Expected End Date: 03/05/2022  Priority: High  Note:    Current Barriers:  Knowledge Deficits related to plan of care for management of CHF, HTN, HLD, COPD, Chronic Pain, and Depression and Axiety  Chronic Disease Management support and education needs related to CHF, HTN, HLD, COPD, Chronic Pain, and depression and anxiety Lacks caregiver support.         RNCM Clinical Goal(s):  Patient will verbalize basic understanding of CHF, HTN, HLD, COPD, Anxiety, Depression, and Chronic Pain disease process and self health management plan as evidenced by keeping appointments, following the plan of care as prescribed by the providers, and working with the CCM team to optimize health and well being take all medications exactly as prescribed and will call provider for medication related questions as evidenced by compliance with medications and calling for refills before running out of medications    attend all scheduled medical appointments: sees pcp on a regular basis, has upcoming appointments with specialist. Knows to call for changes or needs  as evidenced by keeping appointments and calling for schedule change needs        demonstrate improved and ongoing adherence to prescribed treatment plan for CHF, HTN, HLD, COPD, Anxiety, Depression, and chronic pain  as evidenced by stable conditions, no exacerbations of CHF or COPD, mental health conditions stable, VS stable, routine MD  visits and lab work and working with the providers and CCM team to optimize health and well being continue to work with Building services engineer to address care management and care coordination needs related to CHF, HTN, HLD, COPD, Anxiety, Depression, and chronic pain  as evidenced by adherence to CM Team Scheduled appointments     work with pharmacist to address medication reconciliation and needs related to CHF, HTN, HLD, COPD, Anxiety, Depression, and chronic pain as evidenced by review of EMR and patient or pharmacist report    work with Education officer, museum to address [place] Concerns  related to the management of Anxiety and Depression as evidenced by review of EMR and patient or social worker report     demonstrate ongoing self health care management ability effective management of chronic conditions as evidenced by working with the CCM team through collaboration with Consulting civil engineer, provider, and care team.   Interventions: 1:1 collaboration with primary care provider regarding development and update of comprehensive plan of care as evidenced by provider attestation and co-signature Inter-disciplinary care team collaboration (see longitudinal plan of care) Evaluation of current treatment plan related to  self management and patient's adherence to plan as established by provider   Heart Failure Interventions:  (Status: Goal on Track (progressing): YES.)  Long Term Goal  Wt Readings from Last 3 Encounters:  05/15/21 152 lb 3.2 oz (69 kg)  02/21/21 164 lb (74.4 kg)  01/01/21 159 lb 12.8 oz (72.5 kg)   Basic overview and discussion of pathophysiology of Heart Failure reviewed. 03-05-2021: The patient has fluctuations in weights at times. Has been instructed to take 2 of the Lasix currently for additional fluid on board. Was in the ER in January for chest tightness. The patient states she was COVID positive and that was what was going on. Education and support provided. 05-14-2021: The patient states that her weight is better since she is taking 2 of the lasix now. She states the edema is there but not as bad. Review of elevation of feet and legs when sitting. The patient sees vascular provider on 06-08-2021 and pcp tomorrow. 07-02-2021: The patient saw the pcp in April and follows up with cardiologist as needed. She states that the swelling in her feet and legs are better. She says there is some swelling there but nothing worse than usual. She has lost weight and states she is stable.  Provided education on low sodium diet. 07-02-2021: Review of heart healthy diet and to  monitor for hidden sodium in foods. The patient tries to be mindful of dietary intake. States that her appetite is good and she is monitoring her dietary intake.  Reviewed Heart Failure Action Plan in depth and provided written copy Assessed need for readable accurate scales in home Provided education about placing scale on hard, flat surface Advised patient to weigh each morning after emptying bladder Discussed importance of daily weight and advised patient to weigh and record daily Reviewed role of diuretics in prevention of fluid overload and management of heart failure Discussed the importance of keeping all appointments with provider. Has follow up with the pcp on 05-15-2021 and vascular provider on 06-08-2021. Saw cardiologist in December of 2022. The patient aware of calling for changes in CHF or other chronic conditions. 07-02-2021: Saw the pcp 05-15-2021. Knows to call for changes and keep appointments as scheduled.  Provided patient with education about the role of exercise in the management of heart failure. 07-02-2021: Review of  fall and safety prevention. The patient has not gotten her Rolator walker with wheels and seat yet. The patient says she will go soon to get it.  Advised patient to discuss changes in weight, plus 2-3 in one day or 5 in one week, concerns for management of HF, complex conditions related to HF and COPD exacerbations at times with provider Screening for signs and symptoms of depression related to chronic disease state  Assessed social determinant of health barriers  COPD: (Status: Goal on Track (progressing): YES.) Long Term Goal  Reviewed medications with patient, including use of prescribed maintenance and rescue inhalers, and provided instruction on medication management and the importance of adherence. 03-05-2021: The patient is compliant with medications. The patient states that she had COVID and finished her last prednisone today. The patient is monitoring for changes in  conditions. 07-02-2021: The patient is doing well and denies any issues with COPD or any acute exacerbations. Denies exacerbations or new concerns related to her breathing.  Provided patient with basic written and verbal COPD education on self care/management/and exacerbation prevention. 03-05-2021: Review of being safe and not going around others who are sick, being mindful of handwashing, and changes in weather that could cause exacerbation in conditions. The patient states she was COVID positive in January and got better but then got worse again. She states that she is feeling much better. Review of what worse looks like. 07-02-2021: The patient knows factors that cause exacerbation of COPD. Denies any new concerns today. Advised patient to track and manage COPD triggers. 07-02-2021: Review of triggers and discussed the patient monitoring for changes.  Provided written and verbal instructions on pursed lip breathing and utilized returned demonstration as teach back Provided instruction about proper use of medications used for management of COPD including inhalers Advised patient to self assesses COPD action plan zone and make appointment with provider if in the yellow zone for 48 hours without improvement Advised patient to engage in light exercise as tolerated 3-5 days a week to aid in the the management of COPD Provided education about and advised patient to utilize infection prevention strategies to reduce risk of respiratory infection. 07-02-2021: The patient knows factors that increase her risk of exacerbation. She is mindful of who she is around. Practices good hygiene and safety.  Discussed the importance of adequate rest and management of fatigue with COPD  Anxiety and Depression  (Status: Goal on Track (progressing): YES.) Long Term Goal  Evaluation of current treatment plan related to Anxiety and Depression, Mental Health Concerns  and the need for a new mental health provider  self-management and  patient's adherence to plan as established by provider. 07-02-2021: The patient is happy to report she has an appointment on 07-12-2021 with [facility] at [phone]. She does not have it with Dr. Jeneen Rinks but another provider in the practice. She is hopeful this will be a good fit for her. Discussed plans with patient for ongoing care management follow up and provided patient with direct contact information for care management team. 05-14-2021: The patient states she is stable but she still has not secured a specialist to help her with her mental health needs. She needs someone local and cannot return to the one at [place] due to a misunderstanding and the doctor dismissing her for taking her husbands medications. The patient states she did not do this but states her husband told her that she had because she kept asking him the same questions. Empathetic listening and support  given. The patient is also working with the LCSW on finding support for her mental health needs. Sees pcp tomorrow. 07-02-2021: The patient states that she has been very depressed lately. She denies suicidal ideation or wanting to harm herself. She states that she usually goes outside and changes her direction of thought when she is depressed. There is no defining thing that makes her depression kick in. She states she is hopeful getting established with a provider for psychiatric care will be helpful. She has an appointment coming up on 07-12-2021 with [place]. Advised patient to call the office for changes in mood, anxiety, depression, or worsening mental health conditions; Provided education to patient re: continuing to reach out to the resources provided to her for mental health services. She has been unable to secure a new mental health provider. 07-02-2021: She has an appointment coming up and knows to keep appointment and to call for changes if needed.; Reviewed medications with patient and  discussed compliance. 03-05-2021: The patient states that she is compliant with her medications but [employer] called her last week and told her she needed to talk to her doctor about prescribing something not as "dangerous as Klonopin". The patient has not called the office to discuss this. Will collaborate with the pcp and CCM team for recommendations. The patient has been unable to secure a new mental health provider in the area. 07-02-2021: The patient is compliant with medications. She continues to work with pharm D and pcp on a consistent basis. Denies any concerns with medications at this time.; Collaborated with pcp, pharm  D, and LCSW regarding mental health provider concerns and the patient expressing that her insurance company called her last week and ask her to discuss with her provider about an alternative medication to "klonopin" because it was "too dangerous for her to take". Education and support given. The RNCM will reach out to the team for recommendations and support. The patient has her medications and is taking as directed. The patient denies any acute distress or changes in her mental health at this time. 07-02-2021: On going support and education from the CCM team and pcp to meet mental health needs and concerns; Provided patient with mindfulness, deep breathing exercises, and medication educational materials related to effective management of depression and anxiety.  The patient states that when she is "nervous" she finds comfort in deep breathing, resting, and spending time with her little dog: "Peggy". 07-02-2021: Review with the patient doing things she enjoys, reaching out to others who support her when having a bad day and monitoring for changes in her mental health and well being.  Reviewed scheduled/upcoming provider appointments including: 07-12-2021: With [facility]: [phone]. The patient is hopeful the provider will be a good fit for her. Social Work referral  for ongoing support and education for mental health needs. The patient has worked with the LCSW in the past and also has an upcoming appointment for 03-20-2021 at 10 am with LCSW. 07-02-2021: Ongoing support and education from the LCSW; Discussed plans with patient for ongoing care management follow up and provided patient with direct contact information for care management team; Advised patient to discuss mental health concerns and medications  with provider; Screening for signs and symptoms of depression related to chronic disease state;  Assessed social determinant of health barriers;   Hyperlipidemia:  (Status: Goal on Track (progressing): YES.) Long Term Goal  Lab Results  Component Value Date   CHOL 135 04/10/2018  HDL 59 04/10/2018   LDLCALC 64 04/10/2018   TRIG 50 04/10/2018   CHOLHDL 2.3 04/10/2018     Medication review performed; medication list updated in electronic medical record. 07-02-2021: Takes Lipitor 40 mg QD Provider established cholesterol goals reviewed. 05-14-2021: Last labs patient was at goal; Counseled on importance of regular laboratory monitoring as prescribed. 07-02-2021: Education on the need to have updated lipid panel.  Provided HLD educational materials; Reviewed role and benefits of statin for ASCVD risk reduction; Discussed strategies to manage statin-induced myalgias. 07-02-2021: The patient denies any issues with statins at this time. Reviewed importance of limiting foods high in cholesterol. 07-02-2021: Review and education provided  Hypertension: (Status: Goal on Track (progressing): YES.) Long Term Goal  Last practice recorded BP readings:  BP Readings from Last 3 Encounters:  05/15/21 124/68  02/21/21 (!) 126/59  01/01/21 124/68  Recent home reading 124/64 HR of 69 Most recent eGFR/CrCl:  Lab Results  Component Value Date   EGFR 52 (L) 01/01/2021    No components found for: CRCL  Evaluation of current treatment plan related to hypertension self  management and patient's adherence to plan as established by provider. 07-02-2021: Denies any new issues with HTN or heart health. States her blood pressure is stable. Will continue to monitor.;   Provided education to patient re: stroke prevention, s/s of heart attack and stroke; Reviewed prescribed diet heart healthy. 07-02-2021: The patient is compliant with heart healthy diet Reviewed medications with patient and discussed importance of compliance. 07-02-2021: The patient is compliant with medications. Denies any new concerns. Works with [place] D on a regular basis.  Discussed plans with patient for ongoing care management follow up and provided patient with direct contact information for care management team; Advised patient, providing education and rationale, to monitor blood pressure daily and record, calling PCP for findings outside established parameters;  Advised patient to discuss changes in blood pressures or heart health with provider; Provided education on prescribed diet heart healthy/ADA diet ;  Discussed complications of poorly controlled blood pressure such as heart disease, stroke, circulatory complications, vision complications, kidney impairment, sexual dysfunction;   Pain:  (Status: Goal on Track (progressing): YES.) Long Term Goal  Pain assessment performed. 03-05-2021: The patient denies any pain at this time. The patient states her pain is stable. 05-14-2021: The patient rates her pain level at a 6/7 today mainly in her back but generalized. She states that it is pretty much constant. 07-02-2021: Denies any pain today. The patient knows how to effectively manage.  Medications reviewed. 07-02-2021: The patient takes medications as prescribed. The patient states she takes "Tylenol for pain relief". She says some days are worse than others and it varies Reviewed provider established plan for pain management; Discussed importance of adherence to all scheduled medical appointments. Saw pcp on  05-15-2021. Knows to call for changes or new needs. Will continue to monitor Counseled on the importance of reporting any/all new or changed pain symptoms or management strategies to pain management provider; Advised patient to report to care team affect of pain on daily activities; Discussed use of relaxation techniques and/or diversional activities to assist with pain reduction (distraction, imagery, relaxation, massage, acupressure, TENS, heat, and cold application; Reviewed with patient prescribed pharmacological and nonpharmacological pain relief strategies; Advised patient to discuss unresolved pain, changes in level or intensity of pain with provider;   Falls:  (Status: Goal on Track (progressing): YES.) Long Term Goal  Provided written and verbal education re: potential causes of  falls and Fall prevention strategies Reviewed medications and discussed potential side effects of medications such as dizziness and frequent urination Advised patient of importance of notifying provider of falls. 07-02-2021: The patient given education on reporting new falls to the provider and to monitor for patterns or changes in safety in the patients environment. The patient had a fall today in her apartment. She states that she fell in her apartment. She did not think she needed to go to be evaluated. The patient states that she is okay. Review of DME and the patient states that she has not gotten her Rolator at this time. She plans on going soon to get.  Assessed for signs and symptoms of orthostatic hypotension. 07-02-2021: The patient states that she was real dizzy this am. The patient denies this being a frequent event. Education on her blood pressure possibly being low when she is having dizziness. The patient states she does not know why she may be getting dizzy. Education on changing position slowing from a lying to a sitting and a sitting to a standing. Education also on letting pcp know if this is something that  is reoccurring event. The patient verbalized understanding.  Assessed for falls since last encounter. 05-14-2021: The patient states she had a fall about 3 weeks ago. She denies hitting her head or injuries sustained from the fall. Review of safety and fall preventions. 07-02-2021: The patient had a fall today in her apartment. Denies any injuries or the need to be evaluated. Encouraged the patient to get her Rolator DME as soon as she can to aide in helping her around the home and when she goes out to prevent falls.  Assessed patients knowledge of fall risk prevention secondary to previously provided education Provided patient information for fall alert systems Advised patient to discuss new safety concerns with provider   Patient Goals/Self-Care Activities: Take medications as prescribed   Attend all scheduled provider appointments Call pharmacy for medication refills 3-7 days in advance of running out of medications Perform all self care activities independently  Perform IADL's (shopping, preparing meals, housekeeping, managing finances) independently Call provider office for new concerns or questions  Work with the social worker to address care coordination needs and will continue to work with the clinical team to address health care and disease management related needs call the Suicide and Crisis Lifeline: 988 call the [place] National Suicide Prevention Lifeline: 567-555-1680 or TTY: 804-748-6490 TTY 725-272-7718) to talk to a trained counselor call 1-800-273-TALK (toll free, 24 hour hotline) if experiencing a Mental Health or [place]  call office if I gain more than 2 pounds in one day or 5 pounds in one week keep legs up while sitting track weight in diary use salt in moderation watch for swelling in feet, ankles and legs every day weigh myself daily begin a heart failure diary bring diary to all appointments develop a rescue plan follow rescue plan if symptoms  flare-up eat more whole grains, fruits and vegetables, lean meats and healthy fats know when to call the doctorfor changes in HF, increased swelling in feet, legs, abdomen, and sx and sx of dehydration  track symptoms and what helps feel better or worse dress right for the weather, hot or cold - avoid second hand smoke - eliminate smoking in my home - identify and avoid work-related triggers - identify and remove indoor air pollutants - limit outdoor activity during cold weather - listen for public air quality announcements every day - do  breathing exercises every day - arrange respite care for caregiver - attend pulmonary rehabilitation - begin a symptom diary - develop a rescue plan - eliminate symptom triggers at home - follow rescue plan if symptoms flare-up - use an extra pillow to sleep - develop a new routine to improve sleep - don't eat or exercise right before bedtime - eat healthy/prescribed diet: heart healthy - get at least 7 to 8 hours of sleep at night - use devices that will help like a cane, sock-puller or reacher - practice relaxation or meditation daily - do exercises in a comfortable position that makes breathing as easy as possible check blood pressure weekly choose a place to take my blood pressure (home, clinic or office, retail store) write blood pressure results in a log or diary learn about high blood pressure keep a blood pressure log take blood pressure log to all doctor appointments call doctor for signs and symptoms of high blood pressure develop an action plan for high blood pressure keep all doctor appointments take medications for blood pressure exactly as prescribed report new symptoms to your doctor eat more whole grains, fruits and vegetables, lean meats and healthy fats - call for medicine refill 2 or 3 days before it runs out - take all medications exactly as prescribed - call doctor with any symptoms you believe are related to your  medicine - call doctor when you experience any new symptoms - go to all doctor appointments as scheduled - adhere to prescribed diet: heart healthy       Plan:Telephone follow up appointment with care management team member scheduled for:  09-17-2021 at 1 pm  [name] RN, MSN, [place] Mobile: [phone]

## 2021-07-02 NOTE — Telephone Encounter (Signed)
  Care Management   Follow Up Note   07/02/2021 Name: Beth [name] MRN: [id number] DOB: 07-Oct-1945   Referred by: [name], DO Reason for referral : Chronic Care Management (RNCM: Follow up for Chronic Disease Management and Care Coordination Needs)   Return call made back to the patient and was able to reach the patient. See new encounter for more details.   Follow Up Plan: Telephone follow up appointment with care management team member scheduled for: 09-17-2021 at 1 pm  [name] RN, MSN, [place] Mobile: [phone]

## 2021-07-02 NOTE — Patient Instructions (Signed)
Visit Information  Thank you for taking time to visit with me today. Please don't hesitate to contact me if I can be of assistance to you before our next scheduled telephone appointment.  Following are the goals we discussed today:  Heart Failure Interventions:  (Status: Goal on Track (progressing): YES.)  Long Term Goal     Wt Readings from Last 3 Encounters:  05/15/21 152 lb 3.2 oz (69 kg)  02/21/21 164 lb (74.4 kg)  01/01/21 159 lb 12.8 oz (72.5 kg)    Basic overview and discussion of pathophysiology of Heart Failure reviewed. 03-05-2021: The patient has fluctuations in weights at times. Has been instructed to take 2 of the Lasix currently for additional fluid on board. Was in the ER in January for chest tightness. The patient states she was COVID positive and that was what was going on. Education and support provided. 05-14-2021: The patient states that her weight is better since she is taking 2 of the lasix now. She states the edema is there but not as bad. Review of elevation of feet and legs when sitting. The patient sees vascular provider on 06-08-2021 and pcp tomorrow. 07-02-2021: The patient saw the pcp in April and follows up with cardiologist as needed. She states that the swelling in her feet and legs are better. She says there is some swelling there but nothing worse than usual. She has lost weight and states she is stable.  Provided education on low sodium diet. 07-02-2021: Review of heart healthy diet and to monitor for hidden sodium in foods. The patient tries to be mindful of dietary intake. States that her appetite is good and she is monitoring her dietary intake.  Reviewed Heart Failure Action Plan in depth and provided written copy Assessed need for readable accurate scales in home Provided education about placing scale on hard, flat surface Advised patient to weigh each morning after emptying bladder Discussed importance of daily weight and advised patient to weigh and record  daily Reviewed role of diuretics in prevention of fluid overload and management of heart failure Discussed the importance of keeping all appointments with provider. Has follow up with the pcp on 05-15-2021 and vascular provider on 06-08-2021. Saw cardiologist in December of 2022. The patient aware of calling for changes in CHF or other chronic conditions. 07-02-2021: Saw the pcp 05-15-2021. Knows to call for changes and keep appointments as scheduled.  Provided patient with education about the role of exercise in the management of heart failure. 07-02-2021: Review of fall and safety prevention. The patient has not gotten her Rolator walker with wheels and seat yet. The patient says she will go soon to get it.  Advised patient to discuss changes in weight, plus 2-3 in one day or 5 in one week, concerns for management of HF, complex conditions related to HF and COPD exacerbations at times with provider Screening for signs and symptoms of depression related to chronic disease state  Assessed social determinant of health barriers   COPD: (Status: Goal on Track (progressing): YES.) Long Term Goal  Reviewed medications with patient, including use of prescribed maintenance and rescue inhalers, and provided instruction on medication management and the importance of adherence. 03-05-2021: The patient is compliant with medications. The patient states that she had COVID and finished her last prednisone today. The patient is monitoring for changes in conditions. 07-02-2021: The patient is doing well and denies any issues with COPD or any acute exacerbations. Denies exacerbations or new concerns related to her  breathing.  Provided patient with basic written and verbal COPD education on self care/management/and exacerbation prevention. 03-05-2021: Review of being safe and not going around others who are sick, being mindful of handwashing, and changes in weather that could cause exacerbation in conditions. The patient states she was  COVID positive in January and got better but then got worse again. She states that she is feeling much better. Review of what worse looks like. 07-02-2021: The patient knows factors that cause exacerbation of COPD. Denies any new concerns today. Advised patient to track and manage COPD triggers. 07-02-2021: Review of triggers and discussed the patient monitoring for changes.  Provided written and verbal instructions on pursed lip breathing and utilized returned demonstration as teach back Provided instruction about proper use of medications used for management of COPD including inhalers Advised patient to self assesses COPD action plan zone and make appointment with provider if in the yellow zone for 48 hours without improvement Advised patient to engage in light exercise as tolerated 3-5 days a week to aid in the the management of COPD Provided education about and advised patient to utilize infection prevention strategies to reduce risk of respiratory infection. 07-02-2021: The patient knows factors that increase her risk of exacerbation. She is mindful of who she is around. Practices good hygiene and safety.  Discussed the importance of adequate rest and management of fatigue with COPD   Anxiety and Depression  (Status: Goal on Track (progressing): YES.) Long Term Goal  Evaluation of current treatment plan related to Anxiety and Depression, Mental Health Concerns  and the need for a new mental health provider  self-management and patient's adherence to plan as established by provider. 07-02-2021: The patient is happy to report she has an appointment on 07-12-2021 with [facility] at [phone]. She does not have it with Dr. Jeneen Rinks but another provider in the practice. She is hopeful this will be a good fit for her. Discussed plans with patient for ongoing care management follow up and provided patient with direct contact information for care management team. 05-14-2021: The patient states she is  stable but she still has not secured a specialist to help her with her mental health needs. She needs someone local and cannot return to the one at [place] due to a misunderstanding and the doctor dismissing her for taking her husbands medications. The patient states she did not do this but states her husband told her that she had because she kept asking him the same questions. Empathetic listening and support given. The patient is also working with the LCSW on finding support for her mental health needs. Sees pcp tomorrow. 07-02-2021: The patient states that she has been very depressed lately. She denies suicidal ideation or wanting to harm herself. She states that she usually goes outside and changes her direction of thought when she is depressed. There is no defining thing that makes her depression kick in. She states she is hopeful getting established with a provider for psychiatric care will be helpful. She has an appointment coming up on 07-12-2021 with [place]. Advised patient to call the office for changes in mood, anxiety, depression, or worsening mental health conditions; Provided education to patient re: continuing to reach out to the resources provided to her for mental health services. She has been unable to secure a new mental health provider. 07-02-2021: She has an appointment coming up and knows to keep appointment and to call for changes if needed.; Reviewed medications with patient  and discussed compliance. 03-05-2021: The patient states that she is compliant with her medications but [employer] called her last week and told her she needed to talk to her doctor about prescribing something not as "dangerous as Klonopin". The patient has not called the office to discuss this. Will collaborate with the pcp and CCM team for recommendations. The patient has been unable to secure a new mental health provider in the area. 07-02-2021: The patient is compliant with medications. She  continues to work with pharm D and pcp on a consistent basis. Denies any concerns with medications at this time.; Collaborated with pcp, pharm  D, and LCSW regarding mental health provider concerns and the patient expressing that her insurance company called her last week and ask her to discuss with her provider about an alternative medication to "klonopin" because it was "too dangerous for her to take". Education and support given. The RNCM will reach out to the team for recommendations and support. The patient has her medications and is taking as directed. The patient denies any acute distress or changes in her mental health at this time. 07-02-2021: On going support and education from the CCM team and pcp to meet mental health needs and concerns; Provided patient with mindfulness, deep breathing exercises, and medication educational materials related to effective management of depression and anxiety.  The patient states that when she is "nervous" she finds comfort in deep breathing, resting, and spending time with her little dog: "Peggy". 07-02-2021: Review with the patient doing things she enjoys, reaching out to others who support her when having a bad day and monitoring for changes in her mental health and well being.  Reviewed scheduled/upcoming provider appointments including: 07-12-2021: With [facility]: [phone]. The patient is hopeful the provider will be a good fit for her. Social Work referral for ongoing support and education for mental health needs. The patient has worked with the LCSW in the past and also has an upcoming appointment for 03-20-2021 at 10 am with LCSW. 07-02-2021: Ongoing support and education from the LCSW; Discussed plans with patient for ongoing care management follow up and provided patient with direct contact information for care management team; Advised patient to discuss mental health concerns and medications  with provider; Screening for signs  and symptoms of depression related to chronic disease state;  Assessed social determinant of health barriers;    Hyperlipidemia:  (Status: Goal on Track (progressing): YES.) Long Term Goal       Lab Results  Component Value Date    CHOL 135 04/10/2018    HDL 59 04/10/2018    LDLCALC 64 04/10/2018    TRIG 50 04/10/2018    CHOLHDL 2.3 04/10/2018      Medication review performed; medication list updated in electronic medical record. 07-02-2021: Takes Lipitor 40 mg QD Provider established cholesterol goals reviewed. 05-14-2021: Last labs patient was at goal; Counseled on importance of regular laboratory monitoring as prescribed. 07-02-2021: Education on the need to have updated lipid panel.  Provided HLD educational materials; Reviewed role and benefits of statin for ASCVD risk reduction; Discussed strategies to manage statin-induced myalgias. 07-02-2021: The patient denies any issues with statins at this time. Reviewed importance of limiting foods high in cholesterol. 07-02-2021: Review and education provided   Hypertension: (Status: Goal on Track (progressing): YES.) Long Term Goal  Last practice recorded BP readings:     BP Readings from Last 3 Encounters:  05/15/21 124/68  02/21/21 (!) 126/59  01/01/21 124/68  Recent  home reading 124/64 HR of 69 Most recent eGFR/CrCl:       Lab Results  Component Value Date    EGFR 52 (L) 01/01/2021    No components found for: CRCL   Evaluation of current treatment plan related to hypertension self management and patient's adherence to plan as established by provider. 07-02-2021: Denies any new issues with HTN or heart health. States her blood pressure is stable. Will continue to monitor.;   Provided education to patient re: stroke prevention, s/s of heart attack and stroke; Reviewed prescribed diet heart healthy. 07-02-2021: The patient is compliant with heart healthy diet Reviewed medications with patient and discussed importance of compliance. 07-02-2021:  The patient is compliant with medications. Denies any new concerns. Works with [place] [employer] on a regular basis.  Discussed plans with patient for ongoing care management follow up and provided patient with direct contact information for care management team; Advised patient, providing education and rationale, to monitor blood pressure daily and record, calling PCP for findings outside established parameters;  Advised patient to discuss changes in blood pressures or heart health with provider; Provided education on prescribed diet heart healthy/ADA diet ;  Discussed complications of poorly controlled blood pressure such as heart disease, stroke, circulatory complications, vision complications, kidney impairment, sexual dysfunction;    Pain:  (Status: Goal on Track (progressing): YES.) Long Term Goal  Pain assessment performed. 03-05-2021: The patient denies any pain at this time. The patient states her pain is stable. 05-14-2021: The patient rates her pain level at a 6/7 today mainly in her back but generalized. She states that it is pretty much constant. 07-02-2021: Denies any pain today. The patient knows how to effectively manage.  Medications reviewed. 07-02-2021: The patient takes medications as prescribed. The patient states she takes "Tylenol for pain relief". She says some days are worse than others and it varies Reviewed provider established plan for pain management; Discussed importance of adherence to all scheduled medical appointments. Saw pcp on 05-15-2021. Knows to call for changes or new needs. Will continue to monitor Counseled on the importance of reporting any/all new or changed pain symptoms or management strategies to pain management provider; Advised patient to report to care team affect of pain on daily activities; Discussed use of relaxation techniques and/or diversional activities to assist with pain reduction (distraction, imagery, relaxation, massage, acupressure, TENS, heat, and cold  application; Reviewed with patient prescribed pharmacological and nonpharmacological pain relief strategies; Advised patient to discuss unresolved pain, changes in level or intensity of pain with provider;     Falls:  (Status: Goal on Track (progressing): YES.) Long Term Goal  Provided written and verbal education re: potential causes of falls and Fall prevention strategies Reviewed medications and discussed potential side effects of medications such as dizziness and frequent urination Advised patient of importance of notifying provider of falls. 07-02-2021: The patient given education on reporting new falls to the provider and to monitor for patterns or changes in safety in the patients environment. The patient had a fall today in her apartment. She states that she fell in her apartment. She did not think she needed to go to be evaluated. The patient states that she is okay. Review of DME and the patient states that she has not gotten her Rolator at this time. She plans on going soon to get.  Assessed for signs and symptoms of orthostatic hypotension. 07-02-2021: The patient states that she was real dizzy this am. The patient denies this being a frequent  event. Education on her blood pressure possibly being low when she is having dizziness. The patient states she does not know why she may be getting dizzy. Education on changing position slowing from a lying to a sitting and a sitting to a standing. Education also on letting pcp know if this is something that is reoccurring event. The patient verbalized understanding.  Assessed for falls since last encounter. 05-14-2021: The patient states she had a fall about 3 weeks ago. She denies hitting her head or injuries sustained from the fall. Review of safety and fall preventions. 07-02-2021: The patient had a fall today in her apartment. Denies any injuries or the need to be evaluated. Encouraged the patient to get her Rolator DME as soon as she can to aide in helping  her around the home and when she goes out to prevent falls.  Assessed patients knowledge of fall risk prevention secondary to previously provided education Provided patient information for fall alert systems Advised patient to discuss new safety concerns with provider     Our next appointment is by telephone on 09-17-2021 at 1 pm  Please call the care guide team at [phone] if you need to cancel or reschedule your appointment.   If you are experiencing a Mental Health or [place] or need someone to talk to, please call the Suicide and Crisis Lifeline: 988 call the [place] National Suicide Prevention Lifeline: 520-781-3946 or TTY: (509) 128-8171 TTY 203 789 6551) to talk to a trained counselor call 1-800-273-TALK (toll free, 24 hour hotline)   The patient verbalized understanding of instructions, educational materials, and care plan provided today and DECLINED offer to receive copy of patient instructions, educational materials, and care plan.   [name] RN, MSN, [place] Mobile: [phone]

## 2021-07-04 ENCOUNTER — Ambulatory Visit: Payer: Self-pay | Admitting: Psychiatry

## 2021-07-06 ENCOUNTER — Ambulatory Visit: Payer: Medicare Other | Admitting: Cardiology

## 2021-07-06 ENCOUNTER — Encounter: Payer: Self-pay | Admitting: Emergency Medicine

## 2021-07-06 ENCOUNTER — Ambulatory Visit: Payer: Self-pay

## 2021-07-06 ENCOUNTER — Telehealth: Payer: Medicare Other | Admitting: Physician Assistant

## 2021-07-06 ENCOUNTER — Ambulatory Visit
Admission: EM | Admit: 2021-07-06 | Discharge: 2021-07-06 | Disposition: A | Discharge status: Home / Self Care | Payer: Medicare Other | Attending: Internal Medicine | Admitting: Internal Medicine

## 2021-07-06 DIAGNOSIS — N10 Acute tubulo-interstitial nephritis: Secondary | ICD-10-CM | POA: Diagnosis not present

## 2021-07-06 LAB — URINALYSIS, ROUTINE W REFLEX MICROSCOPIC
Bilirubin Urine: NEGATIVE
Glucose, UA: NEGATIVE mg/dL
Ketones, ur: NEGATIVE mg/dL
Nitrite: NEGATIVE
Protein, ur: NEGATIVE mg/dL
Specific Gravity, Urine: 1.01 (ref 1.005–1.030)
pH: 6.5 (ref 5.0–8.0)

## 2021-07-06 LAB — URINALYSIS, MICROSCOPIC (REFLEX)

## 2021-07-06 MED ORDER — HYDROCODONE-ACETAMINOPHEN 5-325 MG PO TABS: 1.0000 | ORAL_TABLET | Freq: Four times a day (QID) | ORAL | 0 refills | Status: DC | PRN

## 2021-07-06 MED ORDER — LEVOFLOXACIN 500 MG PO TABS: 500.0000 mg | ORAL_TABLET | Freq: Every day | ORAL | 0 refills | Status: DC

## 2021-07-06 NOTE — Patient Instructions (Signed)
Beth [name], thank you for joining Mar Daring, PA-C for today's virtual visit.  While this provider is not your primary care provider (PCP), if your PCP is located in our provider database this encounter information will be shared with them immediately following your visit.  Consent: (Patient) Beth [name] provided verbal consent for this virtual visit at the beginning of the encounter.  Current Medications:  Current Outpatient Medications:    acetaminophen (TYLENOL) 500 MG tablet, Take 500 mg by mouth 2 (two) times daily as needed., Disp: , Rfl:    albuterol (VENTOLIN HFA) 108 (90 Base) MCG/ACT inhaler, Inhale into the lungs every 6 (six) hours as needed for wheezing or shortness of breath., Disp: , Rfl:    aspirin 81 MG chewable tablet, Chew 81 mg by mouth daily., Disp: , Rfl:    atorvastatin (LIPITOR) 40 MG tablet, TAKE 1 TABLET BY MOUTH EVERY DAY, Disp: 90 tablet, Rfl: 3   clonazePAM (KLONOPIN) 0.5 MG tablet, TAKE 1 TABLET(0.5 MG) BY MOUTH EVERY OTHER DAY AS NEEDED FOR ANXIETY, Disp: 15 tablet, Rfl: 2   FLUoxetine (PROZAC) 20 MG capsule, TAKE 3 CAPSULES(60 MG) BY MOUTH DAILY, Disp: 270 capsule, Rfl: 0   furosemide (LASIX) 20 MG tablet, Take 2 tablets (40 mg total) by mouth daily. If swelling is less, can take only 1 pill., Disp: 180 tablet, Rfl: 3   gabapentin (NEURONTIN) 300 MG capsule, Take 2 capsules (600 mg total) by mouth 3 (three) times daily. (Patient taking differently: Take 300 mg by mouth 3 (three) times daily.), Disp: 180 capsule, Rfl: 5   Melatonin 3 MG CAPS, Take 10 mg by mouth at bedtime as needed (sleep). , Disp: , Rfl:    metoprolol succinate (TOPROL-XL) 25 MG 24 hr tablet, TAKE 1/2 TABLET(12.5 MG) BY MOUTH DAILY, Disp: 45 tablet, Rfl: 0   montelukast (SINGULAIR) 10 MG tablet, TAKE 1 TABLET(10 MG) BY MOUTH AT BEDTIME, Disp: 90 tablet, Rfl: 0   multivitamin-iron-minerals-folic acid (CENTRUM) chewable tablet, Chew 1 tablet by mouth daily., Disp: , Rfl:     potassium chloride (KLOR-CON) 10 MEQ tablet, Take 1 tablet (10 mEq total) by mouth daily., Disp: 90 tablet, Rfl: 3   QUEtiapine (SEROQUEL) 100 MG tablet, Take 1 tablet (100 mg total) by mouth at bedtime., Disp: 90 tablet, Rfl: 3   TRELEGY ELLIPTA 200-62.5-25 MCG/ACT AEPB, INHALE 1 PUFF INTO THE LUNGS DAILY, Disp: 60 each, Rfl: 5   Medications ordered in this encounter:  No orders of the defined types were placed in this encounter.    *If you need refills on other medications prior to your next appointment, please contact your pharmacy*  Follow-Up: Call back or seek an in-person evaluation if the symptoms worsen or if the condition fails to improve as anticipated.  Other Instructions Pyelonephritis, Adult  Pyelonephritis is an infection that occurs in the kidney. The kidneys are organs that help clean the blood by moving waste out of the blood and into the pee (urine). This infection can happen quickly, or it can last for a long time. In most cases, it clears up with treatment and does not cause other problems. What are the causes? This condition may be caused by: Germs (bacteria) going from the bladder up to the kidney. This may happen after having a bladder infection. Germs going from the blood to the kidney. What increases the risk? This condition is more likely to develop in: Pregnant women. Older people. People who have any of these conditions: Diabetes. Inflammation  of the prostate gland (prostatitis), in males. Kidney stones or bladder stones. Other problems with the kidney or the parts of your body that carry pee from the kidneys to the bladder (ureters). Cancer. People who have a small, thin tube (catheter) placed in the bladder. People who are sexually active. Women who use a medicine that kills sperm (spermicide) to prevent pregnancy. People who have had a prior urinary tract infection (UTI). What are the signs or symptoms? Symptoms of this condition include: Peeing  often. A strong urge to pee right away. Burning or stinging when peeing. Belly pain. Back pain. Pain in the side (flank area). Fever or chills. Blood in the pee, or dark pee. Feeling sick to your stomach (nauseous) or throwing up (vomiting). How is this treated? This condition may be treated by: Taking antibiotic medicines by mouth (orally). Drinking enough fluids. If the infection is bad, you may need to stay in the hospital. You may be given antibiotics and fluids that are put directly into a vein through an IV tube. In some cases, other treatments may be needed. Follow these instructions at home: Medicines Take your antibiotic medicine as told by your doctor. Do not stop taking the antibiotic even if you start to feel better. Take over-the-counter and prescription medicines only as told by your doctor. General instructions  Drink enough fluid to keep your pee pale yellow. Avoid caffeine, tea, and carbonated drinks. Pee (urinate) often. Avoid holding in pee for long periods of time. Pee before and after sex. After pooping (having a bowel movement), women should wipe from front to back. Use each tissue only once. Keep all follow-up visits as told by your doctor. This is important. Contact a doctor if: You do not feel better after 2 days. Your symptoms get worse. You have a fever. Get help right away if: You cannot take your medicine or drink fluids as told. You have chills and shaking. You throw up. You have very bad pain in your side or back. You feel very weak or you pass out (faint). Summary Pyelonephritis is an infection that occurs in the kidney. In most cases, this infection clears up with treatment and does not cause other problems. Take your antibiotic medicine as told by your doctor. Do not stop taking the antibiotic even if you start to feel better. Drink enough fluid to keep your pee pale yellow. This information is not intended to replace advice given to you by  your health care provider. Make sure you discuss any questions you have with your health care provider. Document Revised: 08/24/2020 Document Reviewed: 08/24/2020 Elsevier Patient Education  [place].    If you have been instructed to have an in-person evaluation today at a local Urgent Care facility, please use the link below. It will take you to a list of all of our available [facility], including address, phone number and hours of operation. Please do not delay care.  [facility]  If you or a family member do not have a primary care provider, use the link below to schedule a visit and establish care. When you choose a [facility] primary care physician or advanced practice provider, you gain a long-term partner in health. Find a Primary Care Provider  Learn more about [facility] in-office and virtual care options: [facility]

## 2021-07-06 NOTE — ED Triage Notes (Signed)
Patient c/o burning when urinating and left sided lower back pain that started a week ago. Patient is currently on Lasix and has urinary frequency. Patient denies fevers.  Patient denies vomiting.

## 2021-07-06 NOTE — Addendum Note (Signed)
Addended by: Mar Daring on: 07/06/2021 02:24 PM   Modules accepted: Level of Service

## 2021-07-06 NOTE — Progress Notes (Signed)
Virtual Visit Consent   Beth [name], you are scheduled for a virtual visit with a [facility] provider today. Just as with appointments in the office, your consent must be obtained to participate. Your consent will be active for this visit and any virtual visit you may have with one of our providers in the next 365 days. If you have a MyChart account, a copy of this consent can be sent to you electronically.  As this is a virtual visit, video technology does not allow for your provider to perform a traditional examination. This may limit your provider's ability to fully assess your condition. If your provider identifies any concerns that need to be evaluated in person or the need to arrange testing (such as labs, EKG, etc.), we will make arrangements to do so. Although advances in technology are sophisticated, we cannot ensure that it will always work on either your end or our end. If the connection with a video visit is poor, the visit may have to be switched to a telephone visit. With either a video or telephone visit, we are not always able to ensure that we have a secure connection.  By engaging in this virtual visit, you consent to the provision of healthcare and authorize for your insurance to be billed (if applicable) for the services provided during this visit. Depending on your insurance coverage, you may receive a charge related to this service.  I need to obtain your verbal consent now. Are you willing to proceed with your visit today? Beth [name] has provided verbal consent on 07/06/2021 for a virtual visit (video or telephone). Mar Daring, PA-C  Date: 07/06/2021 9:48 AM  Virtual Visit via Video Note   I, Mar Daring, connected with  Beth [name]  ([id number], 1945/11/09) on 07/06/21 at  9:30 AM EDT by a video-enabled telemedicine application and verified that I am speaking with the correct person using two identifiers.  Location: Patient: Virtual Visit  Location Patient: Home Provider: Virtual Visit Location Provider: Home Office   I discussed the limitations of evaluation and management by telemedicine and the availability of in person appointments. The patient expressed understanding and agreed to proceed.    Interactive audio and video communications were attempted, although failed due to patient's inability to connect to video. Continued visit with audio only interaction with patient agreement.   History of Present Illness: Beth [name] is a 76 y.o. who identifies as a female who was assigned female at birth, and is being seen today for possible UTI.  HPI: Urinary Tract Infection  This is a new problem. The current episode started in the past 7 days (about a week ago). The problem occurs every urination. The quality of the pain is described as burning and aching. The pain is moderate. There has been no fever. Associated symptoms include flank pain (left side low), frequency, hesitancy and urgency. Pertinent negatives include no chills, discharge, hematuria, nausea or vomiting. She has tried increased fluids and acetaminophen for the symptoms. The treatment provided no relief. Her past medical history is significant for recurrent UTIs. There is no history of catheterization, kidney stones or a urological procedure.   States she has had UTIs before but none have ever hurt like this one.    Problems:  Patient Active Problem List   Diagnosis Date Noted   Chronic diastolic congestive heart failure ([place]) 11/14/2020   Rash 01/14/2020   Pulmonary hypertension ([place]) 09/14/2019   Coccydynia 07/29/2019  Cervicalgia 05/04/2019   Acute pain of left shoulder 05/04/2019   Elbow pain, left 05/04/2019   DOE (dyspnea on exertion) 03/23/2019   Leg swelling 03/22/2019    Class: Acute   Lumbar degenerative disc disease 09/10/2018   Left hip pain 09/10/2018   Post-traumatic osteoarthritis of left elbow 07/14/2018   Lumbar radiculopathy 02/12/2018    Lumbar spondylosis 02/12/2018   Lumbar facet arthropathy 02/12/2018   Atherosclerosis of aorta ([place]) 09/17/2017   Lung nodule 09/17/2017   Urinary tract infection 12/05/2016   Protein-calorie malnutrition, mild (HCC) 08/07/2016   Generalized anxiety disorder 07/15/2016   Chronic pain syndrome 07/15/2016   Severe episode of recurrent major depressive disorder, without psychotic features ([place]) 07/14/2016   Moderate benzodiazepine use disorder ([place]) 05/08/2016   Major depressive disorder, recurrent, severe w/o psychotic behavior ([place]) 05/01/2016   Seasonal allergic rhinitis 03/23/2015   Hyperglycemia 03/23/2015   Senile purpura ([place]) 03/23/2015   Perennial allergic rhinitis with seasonal variation 03/23/2015   Marital problems 10/31/2014   Migraine without aura and without status migrainosus, not intractable 10/31/2014   Myofascial pain syndrome of lumbar spine 08/09/2014   Colon polyp 08/09/2014   COPD, severe ([place]) 08/09/2014   CVA, old, hemiparesis ([place]) 08/09/2014   Dyslipidemia 08/09/2014   Dysfunction of eustachian tube 08/09/2014   Fibromyalgia syndrome 08/09/2014   Gastro-esophageal reflux disease without esophagitis 08/09/2014   Benign migrating glossitis 08/09/2014   Cerebrovascular accident, old 08/09/2014   IBS (irritable bowel syndrome) 08/09/2014   Low back pain with radiation 08/09/2014   Chronic recurrent major depressive disorder ([place]) 18/29/9371   Dysmetabolic syndrome 69/67/8938   OP (osteoporosis) 08/09/2014   Vitamin D deficiency 08/09/2014   Smoking 08/09/2014   Benign hypertension 07/19/2013   Benign neoplasm of skin of trunk 06/03/2013   H/O: pneumonia 09/25/2012    Allergies:  Allergies  Allergen Reactions   Bextra  [Valdecoxib]    Compazine [Prochlorperazine Edisylate]     Stroke-like symptoms   Lithium Carbonate     Leg weakness   Lyrica [Pregabalin]    Medications:  Current Outpatient Medications:    acetaminophen (TYLENOL) 500 MG tablet,  Take 500 mg by mouth 2 (two) times daily as needed., Disp: , Rfl:    albuterol (VENTOLIN HFA) 108 (90 Base) MCG/ACT inhaler, Inhale into the lungs every 6 (six) hours as needed for wheezing or shortness of breath., Disp: , Rfl:    aspirin 81 MG chewable tablet, Chew 81 mg by mouth daily., Disp: , Rfl:    atorvastatin (LIPITOR) 40 MG tablet, TAKE 1 TABLET BY MOUTH EVERY DAY, Disp: 90 tablet, Rfl: 3   clonazePAM (KLONOPIN) 0.5 MG tablet, TAKE 1 TABLET(0.5 MG) BY MOUTH EVERY OTHER DAY AS NEEDED FOR ANXIETY, Disp: 15 tablet, Rfl: 2   FLUoxetine (PROZAC) 20 MG capsule, TAKE 3 CAPSULES(60 MG) BY MOUTH DAILY, Disp: 270 capsule, Rfl: 0   furosemide (LASIX) 20 MG tablet, Take 2 tablets (40 mg total) by mouth daily. If swelling is less, can take only 1 pill., Disp: 180 tablet, Rfl: 3   gabapentin (NEURONTIN) 300 MG capsule, Take 2 capsules (600 mg total) by mouth 3 (three) times daily. (Patient taking differently: Take 300 mg by mouth 3 (three) times daily.), Disp: 180 capsule, Rfl: 5   Melatonin 3 MG CAPS, Take 10 mg by mouth at bedtime as needed (sleep). , Disp: , Rfl:    metoprolol succinate (TOPROL-XL) 25 MG 24 hr tablet, TAKE 1/2 TABLET(12.5 MG) BY MOUTH DAILY, Disp: 45 tablet, Rfl:  0   montelukast (SINGULAIR) 10 MG tablet, TAKE 1 TABLET(10 MG) BY MOUTH AT BEDTIME, Disp: 90 tablet, Rfl: 0   multivitamin-iron-minerals-folic acid (CENTRUM) chewable tablet, Chew 1 tablet by mouth daily., Disp: , Rfl:    potassium chloride (KLOR-CON) 10 MEQ tablet, Take 1 tablet (10 mEq total) by mouth daily., Disp: 90 tablet, Rfl: 3   QUEtiapine (SEROQUEL) 100 MG tablet, Take 1 tablet (100 mg total) by mouth at bedtime., Disp: 90 tablet, Rfl: 3   TRELEGY ELLIPTA 200-62.5-25 MCG/ACT AEPB, INHALE 1 PUFF INTO THE LUNGS DAILY, Disp: 60 each, Rfl: 5  Observations/Objective: Patient is well-developed, well-nourished in no acute distress.  Resting comfortably at home.  Head is normocephalic, atraumatic.  No labored breathing.   Speech is clear and coherent with logical content.  Patient is alert and oriented at baseline.    Assessment and Plan: 1. Pyelonephritis  - High suspicion for early pyelonephritis; DDx: UTI, kidney stone - Advised patient I felt due to age and symptoms it would be best for her to be evaluated in person at a local Urgent Care - She is going to see if son can take her, will call back if not able  Follow Up Instructions: I discussed the assessment and treatment plan with the patient. The patient was provided an opportunity to ask questions and all were answered. The patient agreed with the plan and demonstrated an understanding of the instructions.  A copy of instructions were sent to the patient via MyChart unless otherwise noted below.    The patient was advised to call back or seek an in-person evaluation if the symptoms worsen or if the condition fails to improve as anticipated.  Time:  I spent 30 minutes with the patient via telehealth technology discussing the above problems/concerns.    Mar Daring, PA-C

## 2021-07-06 NOTE — Telephone Encounter (Signed)
Pt having problem going to the restroom, has pain w/ urination, and wanted appt today.  No appts at [facility].  Please advise     Chief Complaint: Painful urination, left flank pain Symptoms: Above Frequency: 1 WEEK ago Pertinent Negatives: Patient denies fever Disposition: '[]'$ ED /'[]'$ Urgent Care (no appt availability in office) / '[]'$ Appointment(In office/virtual)/ '[x]'$  [facility]/ '[]'$ Home Care/ '[]'$ Refused Recommended Disposition /'[]'$ [facility]/ '[]'$  Follow-up with PCP Additional Notes: No availability in practice.  Reason for Disposition  Side (flank) or lower back pain present  Answer Assessment - Initial Assessment Questions 1. SYMPTOM: "What's the main symptom you're concerned about?" (e.g., frequency, incontinence)     Painful urination 2. ONSET: "When did the  symptoms  start?"     1 week ago 3. PAIN: "Is there any pain?" If Yes, ask: "How bad is it?" (Scale: 1-10; mild, moderate, severe)     Left flank 4. CAUSE: "What do you think is causing the symptoms?"     UTI 5. OTHER SYMPTOMS: "Do you have any other symptoms?" (e.g., fever, flank pain, blood in urine, pain with urination)     Flank pain, painful urination 6. PREGNANCY: "Is there any chance you are pregnant?" "When was your last menstrual period?"     No  Protocols used: Urinary Symptoms-A-AH

## 2021-07-06 NOTE — Discharge Instructions (Addendum)
Please increase oral fluid intake Please take medications as prescribed Return to urgent care if symptoms worsen We will call you with recommendations if urine cultures requires [place] to change your antibiotics

## 2021-07-07 LAB — URINE CULTURE

## 2021-07-09 NOTE — ED Provider Notes (Signed)
[facility]    CSN: [id number] Arrival date & time: 07/06/21  1138      History   Chief Complaint Chief Complaint  Patient presents with   Dysuria   Back Pain    HPI Beth [name] is a 76 y.o. female comes to the urgent care with a 7-day history of burning on urination and the left sided lower back pain.  Symptoms started insidiously and has been persistent.  Patient describes the pain as severe and throbbing.  Pain is not relieved by over-the-counter medication.  She denies nausea, vomiting, fever or chills.  No dizziness, near syncopal episode or syncope.  No trauma to her back.  No heavy lifting.   HPI  Past Medical History:  Diagnosis Date   Allergy    Anxiety    Asthma    Chronic pain syndrome    discharged from pain clinic, hx of narcotics seeking behavior   COPD (chronic obstructive pulmonary disease) ([place])    CVA (cerebral infarction)    Depression    Fibromyalgia    Headache    Hyperlipidemia    Hypertension    IBS (irritable bowel syndrome)    Stroke ([place])    Vitamin D deficiency     Patient Active Problem List   Diagnosis Date Noted   Chronic diastolic congestive heart failure ([place]) 11/14/2020   Rash 01/14/2020   Pulmonary hypertension ([place]) 09/14/2019   Coccydynia 07/29/2019   Cervicalgia 05/04/2019   Acute pain of left shoulder 05/04/2019   Elbow pain, left 05/04/2019   DOE (dyspnea on exertion) 03/23/2019   Leg swelling 03/22/2019    Class: Acute   Lumbar degenerative disc disease 09/10/2018   Left hip pain 09/10/2018   Post-traumatic osteoarthritis of left elbow 07/14/2018   Lumbar radiculopathy 02/12/2018   Lumbar spondylosis 02/12/2018   Lumbar facet arthropathy 02/12/2018   Atherosclerosis of aorta ([place]) 09/17/2017   Lung nodule 09/17/2017   Urinary tract infection 12/05/2016   Protein-calorie malnutrition, mild (HCC) 08/07/2016   Generalized anxiety disorder 07/15/2016   Chronic pain syndrome 07/15/2016   Severe episode  of recurrent major depressive disorder, without psychotic features ([place]) 07/14/2016   Moderate benzodiazepine use disorder ([place]) 05/08/2016   Major depressive disorder, recurrent, severe w/o psychotic behavior ([place]) 05/01/2016   Seasonal allergic rhinitis 03/23/2015   Hyperglycemia 03/23/2015   Senile purpura ([place]) 03/23/2015   Perennial allergic rhinitis with seasonal variation 03/23/2015   Marital problems 10/31/2014   Migraine without aura and without status migrainosus, not intractable 10/31/2014   Myofascial pain syndrome of lumbar spine 08/09/2014   Colon polyp 08/09/2014   COPD, severe ([place]) 08/09/2014   CVA, old, hemiparesis ([place]) 08/09/2014   Dyslipidemia 08/09/2014   Dysfunction of eustachian tube 08/09/2014   Fibromyalgia syndrome 08/09/2014   Gastro-esophageal reflux disease without esophagitis 08/09/2014   Benign migrating glossitis 08/09/2014   Cerebrovascular accident, old 08/09/2014   IBS (irritable bowel syndrome) 08/09/2014   Low back pain with radiation 08/09/2014   Chronic recurrent major depressive disorder ([place]) 62/37/6283   Dysmetabolic syndrome 15/17/6160   OP (osteoporosis) 08/09/2014   Vitamin D deficiency 08/09/2014   Smoking 08/09/2014   Benign hypertension 07/19/2013   Benign neoplasm of skin of trunk 06/03/2013   H/O: pneumonia 09/25/2012    Past Surgical History:  Procedure Laterality Date   ABDOMINAL HYSTERECTOMY     APPENDECTOMY     BREAST EXCISIONAL BIOPSY  2011   Pt states lump removed, ? side, no scar seen  BREAST SURGERY  2011   biopsy   CERVICAL DISCECTOMY     CHOLECYSTECTOMY     SINUSOTOMY      OB History   No obstetric history on file.      Home Medications    Prior to Admission medications   Medication Sig Start Date End Date Taking? Authorizing Provider  aspirin 81 MG chewable tablet Chew 81 mg by mouth daily.   Yes [provider]  atorvastatin (LIPITOR) 40 MG tablet TAKE 1 TABLET BY MOUTH EVERY DAY 01/04/21   Yes Karamalegos, [name], DO  clonazePAM (KLONOPIN) 0.5 MG tablet TAKE 1 TABLET(0.5 MG) BY MOUTH EVERY OTHER DAY AS NEEDED FOR ANXIETY 05/03/21  Yes Karamalegos, [name], DO  FLUoxetine (PROZAC) 20 MG capsule TAKE 3 CAPSULES(60 MG) BY MOUTH DAILY 05/15/21  Yes Karamalegos, [name], DO  furosemide (LASIX) 20 MG tablet Take 2 tablets (40 mg total) by mouth daily. If swelling is less, can take only 1 pill. 02/21/21  Yes Karamalegos, [name], DO  gabapentin (NEURONTIN) 300 MG capsule Take 2 capsules (600 mg total) by mouth 3 (three) times daily. Patient taking differently: Take 300 mg by mouth 3 (three) times daily. 11/07/20 07/06/21 Yes [name], MD  HYDROcodone-acetaminophen (NORCO/VICODIN) 5-325 MG tablet Take 1 tablet by mouth every 6 (six) hours as needed. 07/06/21  Yes Shanine Kreiger, Myrene Galas, MD  levofloxacin (LEVAQUIN) 500 MG tablet Take 1 tablet (500 mg total) by mouth daily. 07/06/21  Yes Saivon Prowse, Myrene Galas, MD  Melatonin 3 MG CAPS Take 10 mg by mouth at bedtime as needed (sleep).    Yes [provider]  metoprolol succinate (TOPROL-XL) 25 MG 24 hr tablet TAKE 1/2 TABLET(12.5 MG) BY MOUTH DAILY 05/21/21  Yes Karamalegos, Alexander J, DO  montelukast (SINGULAIR) 10 MG tablet TAKE 1 TABLET(10 MG) BY MOUTH AT BEDTIME 06/14/21  Yes Karamalegos, [name], DO  multivitamin-iron-minerals-folic acid (CENTRUM) chewable tablet Chew 1 tablet by mouth daily.   Yes [provider]  potassium chloride (KLOR-CON) 10 MEQ tablet Take 1 tablet (10 mEq total) by mouth daily. 02/21/21  Yes Karamalegos, [name], DO  QUEtiapine (SEROQUEL) 100 MG tablet Take 1 tablet (100 mg total) by mouth at bedtime. 11/14/20  Yes [name], DO  TRELEGY ELLIPTA 200-62.5-25 MCG/ACT AEPB INHALE 1 PUFF INTO THE LUNGS DAILY 03/16/21  Yes [name], MD  acetaminophen (TYLENOL) 500 MG tablet Take 500 mg by mouth 2 (two) times daily as needed.    [provider]  albuterol  (VENTOLIN HFA) 108 (90 Base) MCG/ACT inhaler Inhale into the lungs every 6 (six) hours as needed for wheezing or shortness of breath.    [provider]    Family History Family History  Problem Relation Age of Onset   Anxiety disorder Mother    Depression Mother    Breast cancer Mother 5   Cancer Father    Gallbladder disease Father    Alcohol abuse Father    Depression Father     Social History Social History   Tobacco Use   Smoking status: Former    Packs/day: 0.25    Years: 57.00    Total pack years: 14.25    Types: Cigarettes    Quit date: 10/24/2020    Years since quitting: 0.7   Smokeless tobacco: Never   Tobacco comments:    6-8cig daily--08/28/2020  Vaping Use   Vaping Use: Never used  Substance Use Topics   Alcohol use: No  Alcohol/week: 0.0 standard drinks of alcohol   Drug use: No     Allergies   Bextra  [valdecoxib], Compazine [prochlorperazine edisylate], Lithium carbonate, and Lyrica [pregabalin]   Review of Systems Review of Systems  Constitutional:  Negative for chills, fatigue and fever.  HENT: Negative.    Respiratory: Negative.    Gastrointestinal: Negative.   Genitourinary:  Positive for dysuria, flank pain and urgency.  Musculoskeletal:  Positive for back pain.  Skin: Negative.      Physical Exam Triage Vital Signs ED Triage Vitals  Enc Vitals Group     BP 07/06/21 1151 (!) 152/65     Pulse Rate 07/06/21 1151 63     Resp 07/06/21 1151 14     Temp 07/06/21 1151 97.7 F (36.5 C)     Temp Source 07/06/21 1151 Oral     SpO2 07/06/21 1151 97 %     Weight 07/06/21 1148 147 lb (66.7 kg)     Height 07/06/21 1148 '5\' 5"'$  (1.651 m)     Head Circumference --      Peak Flow --      Pain Score 07/06/21 1148 8     Pain Loc --      Pain Edu? --      Excl. in [place]? --    No data found.  Updated Vital Signs BP (!) 152/65 (BP Location: Left Arm)   Pulse 63   Temp 97.7 F (36.5 C) (Oral)   Resp 14   Ht '5\' 5"'$  (1.651 m)   Wt  66.7 kg   SpO2 97%   BMI 24.46 kg/m   Visual Acuity Right Eye Distance:   Left Eye Distance:   Bilateral Distance:    Right Eye Near:   Left Eye Near:    Bilateral Near:     Physical Exam Vitals and nursing note reviewed.  Constitutional:      General: She is not in acute distress.    Appearance: She is not ill-appearing.  HENT:     Mouth/Throat:     Mouth: Mucous membranes are dry.  Cardiovascular:     Rate and Rhythm: Normal rate and regular rhythm.  Pulmonary:     Effort: Pulmonary effort is normal.     Breath sounds: Normal breath sounds. No wheezing or rhonchi.  Abdominal:     General: Bowel sounds are normal.     Tenderness: There is right CVA tenderness.  Neurological:     General: No focal deficit present.     Mental Status: She is alert and oriented to person, place, and time.      UC Treatments / Results  Labs (all labs ordered are listed, but only abnormal results are displayed) Labs Reviewed  URINE CULTURE - Abnormal; Notable for the following components:      Result Value   Culture MULTIPLE SPECIES PRESENT, SUGGEST RECOLLECTION (*)    All other components within normal limits  URINALYSIS, ROUTINE W REFLEX MICROSCOPIC - Abnormal; Notable for the following components:   Hgb urine dipstick TRACE (*)    Leukocytes,Ua MODERATE (*)    All other components within normal limits  URINALYSIS, MICROSCOPIC (REFLEX) - Abnormal; Notable for the following components:   Bacteria, UA MANY (*)    All other components within normal limits    EKG   Radiology No results found.  Procedures Procedures (including critical care time)  Medications Ordered in UC Medications - No data to display  Initial Impression / Assessment and Plan /  UC Course  I have reviewed the triage vital signs and the nursing notes.  Pertinent labs & imaging results that were available during my care of the patient were reviewed by me and considered in my medical decision making (see  chart for details).     1.  Acute pyelonephritis: Point-of-care urinalysis is significant for leukocyte esterase, bacteria, trace blood. Urine cultures have been sent Increase oral fluid intake Levaquin 500 mg orally daily for 5 days Hydrocodone-acetaminophen as needed for pain We will call patient with recommendations if labs are abnormal. Return to urgent care if symptoms worsen. Final Clinical Impressions(s) / UC Diagnoses   Final diagnoses:  Acute pyelonephritis     Discharge Instructions      Please increase oral fluid intake Please take medications as prescribed Return to urgent care if symptoms worsen We will call you with recommendations if urine cultures requires [place] to change your antibiotics    ED Prescriptions     Medication Sig Dispense Auth. Provider   levofloxacin (LEVAQUIN) 500 MG tablet Take 1 tablet (500 mg total) by mouth daily. 7 tablet Chasty Randal, Myrene Galas, MD   HYDROcodone-acetaminophen (NORCO/VICODIN) 5-325 MG tablet Take 1 tablet by mouth every 6 (six) hours as needed. 10 tablet Krystiana Fornes, Myrene Galas, MD      I have reviewed the PDMP during this encounter.   Chase Picket, MD 07/10/21 [phone]
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
## 2021-07-11 ENCOUNTER — Ambulatory Visit: Payer: Medicare Other | Admitting: Pharmacist

## 2021-07-11 ENCOUNTER — Telehealth (HOSPITAL_COMMUNITY): Payer: Self-pay | Admitting: Emergency Medicine

## 2021-07-11 DIAGNOSIS — I10 Essential (primary) hypertension: Secondary | ICD-10-CM

## 2021-07-11 DIAGNOSIS — J449 Chronic obstructive pulmonary disease, unspecified: Secondary | ICD-10-CM

## 2021-07-11 NOTE — Chronic Care Management (AMB) (Signed)
Chronic Care Management CCM Pharmacy Note  07/11/2021 Name:  Beth [name] MRN:  [id number] DOB:  08/06/1945   Subjective: Beth [name] is an 76 y.o. year old female who is a primary patient of [name], DO.  The CCM team was consulted for assistance with disease management and care coordination needs.    Engaged with patient by telephone for follow up visit for pharmacy case management and/or care coordination services.   Objective:  Medications Reviewed Today     Reviewed by Jessee Avers, RN (Registered Nurse) on 07/06/21 at 1149  Med List Status: <None>   Medication Order Taking? Sig Documenting Provider Last Dose Status Informant  acetaminophen (TYLENOL) 500 MG tablet [id number]  Take 500 mg by mouth 2 (two) times daily as needed. [provider]  Active   albuterol (VENTOLIN HFA) 108 (90 Base) MCG/ACT inhaler [id number]  Inhale into the lungs every 6 (six) hours as needed for wheezing or shortness of breath. [provider]  Active   aspirin 81 MG chewable tablet [id number] Yes Chew 81 mg by mouth daily. [provider]  Active Pharmacy Records           Med Note Dell Ponto Dec 18, 2016  4:14 PM)    atorvastatin (LIPITOR) 40 MG tablet [id number] Yes TAKE 1 TABLET BY MOUTH EVERY DAY [name], DO  Active   clonazePAM (KLONOPIN) 0.5 MG tablet [id number] Yes TAKE 1 TABLET(0.5 MG) BY MOUTH EVERY OTHER DAY AS NEEDED FOR ANXIETY [name], DO  Active   FLUoxetine (PROZAC) 20 MG capsule [id number] Yes TAKE 3 CAPSULES(60 MG) BY MOUTH DAILY Karamalegos, [name], DO  Active   furosemide (LASIX) 20 MG tablet [id number] Yes Take 2 tablets (40 mg total) by mouth daily. If swelling is less, can take only 1 pill. Karamalegos, [name], DO  Active   gabapentin (NEURONTIN) 300 MG capsule [id number] Yes Take 2 capsules (600 mg total) by mouth 3 (three) times daily.  Patient taking differently: Take  300 mg by mouth 3 (three) times daily.   [name], MD  Active   Melatonin 3 MG CAPS [id number] Yes Take 10 mg by mouth at bedtime as needed (sleep).  [provider]  Active Self  metoprolol succinate (TOPROL-XL) 25 MG 24 hr tablet [id number] Yes TAKE 1/2 TABLET(12.5 MG) BY MOUTH DAILY [name], DO  Active   montelukast (SINGULAIR) 10 MG tablet [id number] Yes TAKE 1 TABLET(10 MG) BY MOUTH AT BEDTIME [name], DO  Active   multivitamin-iron-minerals-folic acid (CENTRUM) chewable tablet [id number] Yes Chew 1 tablet by mouth daily. [provider]  Active   potassium chloride (KLOR-CON) 10 MEQ tablet [id number] Yes Take 1 tablet (10 mEq total) by mouth daily. [place] Ranger, [name], DO  Active   QUEtiapine (SEROQUEL) 100 MG tablet [id number] Yes Take 1 tablet (100 mg total) by mouth at bedtime. Andee Poles  Active   TRELEGY ELLIPTA 200-62.5-25 MCG/ACT AEPB [id number] Yes INHALE 1 PUFF INTO THE LUNGS DAILY [name], MD  Active   Med List Note Rise Patience, RN 02/12/18 1055): Medication agreement signed 02/12/2018 UDS 02/24/17 03/25/17 PA requested for gabapentin sent SB             Pertinent Labs:   Lab Results  Component Value Date   HGBA1C 5.3 06/26/2017   Lab Results  Component Value Date   CHOL 135 04/10/2018   HDL 59 04/10/2018  LDLCALC 64 04/10/2018   TRIG 50 04/10/2018   CHOLHDL 2.3 04/10/2018   Lab Results  Component Value Date   CREATININE 1.10 (H) 01/01/2021   BUN 19 01/01/2021   NA 143 01/01/2021   K 3.6 01/01/2021   CL 102 01/01/2021   CO2 29 01/01/2021    SDOH:  (Social Determinants of Health) assessments and interventions performed:    [place]  Review of patient past medical history, allergies, medications, health status, including review of consultants reports, laboratory and other test data, was performed as part of comprehensive evaluation and provision of chronic care  management services.   Care Plan : PharmD - Med Mgmt  Updates made by Rennis Petty, RPH-CPP since 07/11/2021 12:00 AM     Problem: Disease Progression      Long-Range Goal: Disease Progression Prevented or Minimized   Start Date: 04/10/2020  Expected End Date: 07/09/2020  Recent Progress: On track  Priority: High  Note:   Current Barriers:  Chronic Disease Management support, education, and care coordination needs related to previous CVA, COPD, hypertension, hyperlipidemia, depression  Pharmacist Clinical Goal(s):  Over the next 90 days, patient will achieve adherence to monitoring guidelines and medication adherence to achieve therapeutic efficacy through collaboration with PharmD and provider.   Interventions: 1:1 collaboration with [name], DO regarding development and update of comprehensive plan of care as evidenced by provider attestation and co-signature Inter-disciplinary care team collaboration (see longitudinal plan of care) Perform chart review Office Visit with PCP on 4/18 related to fall/dizziness. Provider advised patient: Taper off Duloxetine '20mg'$  daily to help reduce tremors and dizziness Continue SSRI Fluoxetine '20mg'$  x 3 = '60mg'$  daily Order given for Rollator walker w wheels and seat Per telephone note from 4/28, patient contacted PCP office to request referral to psychiatrist and PCP sent referral as requested [place] Visit on 6/9. Provider advised patient to follow up with urgent care for evaluation of possible pyelonephritis [facility] on 6/9 related to acute pyelonephritis. Provider advised patient: Urine culture sent Increase oral fluid intake Rx sent for Levaquin 500 mg orally daily for 5 days Rx sent for Hydrocodone-acetaminophen 5-325 mg every 6 hours as needed for pain Return to urgent care if symptoms worsen Today patient reports stopped duloxetine as directed by PCP in April and felt like her  shakiness improved. Reports taking course of Levaquin as directed by Urgent care (last dose tomorrow) and reports urinary symptoms resolved. Reports having nausea/feeling shaky with taking Levaquin, but denies taking doses with food Counsel patient to take Levaquin with food, but not dairy products, to help with nausea. Counsel patient to separate administration of Levaquin from multivitamin by at least 2 hours Reports staying well hydrated Reports no longer needing hydrocodone Reports has not yet picked up rollator walker. Encourage patient to pick up and start using this device Reports will ask her son to take her Note patient has initial appointment tomorrow with [facility] Patient confirms planning to attend this appointment and son to drive her Patient to contact office if needed for new symptoms or symptoms not improving  Moderate to Severe COPD:  Patient followed by  [facility] in [place] Current treatment: Trelegy 1 puff daily , rinse after use Patient confirms rinsing after use.  Albuterol inhaler as needed Overnight oxygen as directed by Pulmonology  Hypertension Reports taking: Furosemide 20 mg - 2 tablets (40 mg) daily Potassium chloride 10 mEq daily Metoprolol ER 25  mg - 1/2 tablet (12.5 mg ) daily Denies checking home blood pressure recently Encourage patient monitor home blood pressure, and keep log of BP and bring record to medical appointments Encourage patient to continue monitoring home weight and bring this record to medical appointments Remind patient to follow up with provider if has a weight change greater than established parameters   Patient Goals/Self-Care Activities Over the next 90 days, patient will:  To self administers medications as prescribed Encourage patient to obtain weekly pillbox to use as adherence tool Attends all scheduled provider appointments Calls pharmacy for medication refills Calls provider  office for new concerns or questions  Follow Up Plan: CM Pharmacist will outreach to patient by telephone next on 08/13/2021 at 10:45 AM       Wallace Cullens, PharmD, Para [place], [facility] [place] [phone]

## 2021-07-11 NOTE — Patient Instructions (Signed)
Visit Information  Thank you for taking time to visit with me today. Please don't hesitate to contact me if I can be of assistance to you before our next scheduled telephone appointment.  Following are the goals we discussed today:   Goals Addressed             This Visit's Progress    Pharmacy Goals       Our goal bad cholesterol, or LDL, is less than 70. This is why it is important to continue taking your atorvastatin  Please check your home blood pressure and weights, keep a log of the results and bring this with you to your medical appointments.  Feel free to call me with any questions or concerns. I look forward to our next call!  Wallace Cullens, PharmD, [place] [phone]         Our next appointment is by telephone on 08/13/2021 at 10:45 AM  Please call the care guide team at [phone] if you need to cancel or reschedule your appointment.    The patient verbalized understanding of instructions, educational materials, and care plan provided today and DECLINED offer to receive copy of patient instructions, educational materials, and care plan.

## 2021-07-11 NOTE — Telephone Encounter (Signed)
Patient is being called post-discharge from the Urgent Care and sent to the Emergency Department via private vehicle . Per Dr. Lanny Cramp, patient is in need of higher level of care due to increased weakness, fatigue in the setting of pyelonephritis after several days of antibiotics.   Attempted to reach patient x 1, LVM with instructions

## 2021-07-12 ENCOUNTER — Ambulatory Visit (INDEPENDENT_AMBULATORY_CARE_PROVIDER_SITE_OTHER): Payer: Medicare Other | Admitting: Psychiatry

## 2021-07-12 ENCOUNTER — Encounter: Payer: Self-pay | Admitting: Psychiatry

## 2021-07-12 ENCOUNTER — Other Ambulatory Visit
Admission: RE | Admit: 2021-07-12 | Discharge: 2021-07-12 | Disposition: A | Discharge status: Home / Self Care | Payer: Medicare Other | Attending: Psychiatry | Admitting: Psychiatry

## 2021-07-12 VITALS — BP 112/69 | HR 91 | Temp 97.2°F | Ht 64.5 in | Wt 142.0 lb

## 2021-07-12 DIAGNOSIS — F411 Generalized anxiety disorder: Secondary | ICD-10-CM | POA: Diagnosis not present

## 2021-07-12 DIAGNOSIS — F4310 Post-traumatic stress disorder, unspecified: Secondary | ICD-10-CM

## 2021-07-12 DIAGNOSIS — F331 Major depressive disorder, recurrent, moderate: Secondary | ICD-10-CM | POA: Diagnosis present

## 2021-07-12 DIAGNOSIS — F5104 Psychophysiologic insomnia: Secondary | ICD-10-CM

## 2021-07-12 LAB — TSH: TSH: 2.626 u[IU]/mL (ref 0.350–4.500)

## 2021-07-12 MED ORDER — CLONAZEPAM 0.5 MG PO TABS: 0.5000 mg | ORAL_TABLET | ORAL | 0 refills | Status: DC | PRN

## 2021-07-12 MED ORDER — QUETIAPINE FUMARATE 150 MG PO TABS: 150.0000 mg | ORAL_TABLET | Freq: Every day | ORAL | 1 refills | Status: DC

## 2021-07-12 NOTE — Patient Instructions (Signed)
Continue fluoxetine 60 mg daily Increase quetiapine 150 mg at night (was on 100 mg) Continue clonazepam 0.5 mg every other day as needed for extreme anxiety Obtain lab (TSH) s Next appointment- 7/18 at 2:30

## 2021-07-27 DIAGNOSIS — F32A Depression, unspecified: Secondary | ICD-10-CM

## 2021-07-27 DIAGNOSIS — I110 Hypertensive heart disease with heart failure: Secondary | ICD-10-CM | POA: Diagnosis not present

## 2021-07-27 DIAGNOSIS — J441 Chronic obstructive pulmonary disease with (acute) exacerbation: Secondary | ICD-10-CM | POA: Diagnosis not present

## 2021-07-27 DIAGNOSIS — J449 Chronic obstructive pulmonary disease, unspecified: Secondary | ICD-10-CM

## 2021-07-27 DIAGNOSIS — I509 Heart failure, unspecified: Secondary | ICD-10-CM | POA: Diagnosis not present

## 2021-07-27 DIAGNOSIS — E785 Hyperlipidemia, unspecified: Secondary | ICD-10-CM

## 2021-08-10 ENCOUNTER — Other Ambulatory Visit: Payer: Self-pay | Admitting: Family Medicine

## 2021-08-10 DIAGNOSIS — F332 Major depressive disorder, recurrent severe without psychotic features: Secondary | ICD-10-CM

## 2021-08-10 NOTE — Telephone Encounter (Signed)
Requested Prescriptions  Pending Prescriptions Disp Refills  . FLUoxetine (PROZAC) 20 MG capsule [Pharmacy Med Name: FLUOXETINE '20MG'$  CAPSULES] 270 capsule 0    Sig: TAKE 3 CAPSULES(60 MG) BY MOUTH DAILY     Psychiatry:  Antidepressants - SSRI Passed - 08/10/2021 10:03 AM      Passed - Completed PHQ-2 or PHQ-9 in the last 360 days      Passed - Valid encounter within last 6 months    Recent Outpatient Visits          2 months ago Recurrent falls   [place], DO   5 months ago Chronic diastolic congestive heart failure [facility])   [place], DO   8 months ago Major depressive disorder, recurrent, severe w/o psychotic behavior ([place])   [place], DO   1 year ago Major depressive disorder, recurrent, severe w/o psychotic behavior ([place])   [facility] [name], DO   1 year ago [facility] [place], Lupita Raider, FNP      Future Appointments            In 1 month [facility], [facility]
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
## 2021-08-13 ENCOUNTER — Ambulatory Visit (INDEPENDENT_AMBULATORY_CARE_PROVIDER_SITE_OTHER): Payer: Medicare Other | Admitting: Pharmacist

## 2021-08-13 DIAGNOSIS — I10 Essential (primary) hypertension: Secondary | ICD-10-CM

## 2021-08-13 DIAGNOSIS — J449 Chronic obstructive pulmonary disease, unspecified: Secondary | ICD-10-CM

## 2021-08-13 NOTE — Patient Instructions (Signed)
Visit Information  Thank you for taking time to visit with me today. Please don't hesitate to contact me if I can be of assistance to you before our next scheduled telephone appointment.  Following are the goals we discussed today:   Goals Addressed             This Visit's Progress    Pharmacy Goals       Our goal bad cholesterol, or LDL, is less than 70. This is why it is important to continue taking your atorvastatin  Please check your home blood pressure and weights, keep a log of the results and bring this with you to your medical appointments.  Feel free to call me with any questions or concerns. I look forward to our next call!   Wallace Cullens, PharmD, [place] [phone]         Our next appointment is by telephone on 10/31/2021 at 10:45 AM  Please call the care guide team at [phone] if you need to cancel or reschedule your appointment.    The patient verbalized understanding of instructions, educational materials, and care plan provided today and DECLINED offer to receive copy of patient instructions, educational materials, and care plan.

## 2021-08-13 NOTE — Chronic Care Management (AMB) (Signed)
Chronic Care Management CCM Pharmacy Note  08/13/2021 Name:  Beth [name] MRN:  [id number] DOB:  1945/07/25   Subjective: Beth [name] is an 76 y.o. year old female who is a primary patient of Beth Hauser, DO.  The CCM team was consulted for assistance with disease management and care coordination needs.    Engaged with patient by telephone for follow up visit for pharmacy case management and/or care coordination services.   Objective:  Medications Reviewed Today     Reviewed by Rennis Petty, RPH-CPP (Pharmacist) on 08/13/21 at 1057  Med List Status: <None>   Medication Order Taking? Sig Documenting Provider Last Dose Status Informant  acetaminophen (TYLENOL) 500 MG tablet [id number]  Take 500 mg by mouth 2 (two) times daily as needed. [provider]  Active   albuterol (VENTOLIN HFA) 108 (90 Base) MCG/ACT inhaler [id number] Yes Inhale into the lungs every 6 (six) hours as needed for wheezing or shortness of breath. [provider] Taking Active   aspirin 81 MG chewable tablet [id number]  Chew 81 mg by mouth daily. [provider]  Active Pharmacy Records           Med Note Dell [place] Dec 18, 2016  4:14 PM)    atorvastatin (LIPITOR) 40 MG tablet [id number] Yes TAKE 1 TABLET BY MOUTH EVERY DAY Beth Hauser, DO Taking Active   clonazePAM (KLONOPIN) 0.5 MG tablet [id number] Yes Take 1 tablet (0.5 mg total) by mouth every other day as needed (severe anxiety). TAKE 1 TABLET(0.5 MG) BY MOUTH EVERY OTHER DAY AS NEEDED FOR ANXIETY Hisada, Reina, MD Taking Active   FLUoxetine (PROZAC) 20 MG capsule [id number] Yes TAKE 3 CAPSULES(60 MG) BY MOUTH DAILY Parks Ranger, [name], DO Taking Active   furosemide (LASIX) 20 MG tablet [id number] Yes Take 2 tablets (40 mg total) by mouth daily. If swelling is less, can take only 1 pill. Beth Hauser, DO Taking Active   gabapentin (NEURONTIN) 300 MG capsule [id number]  Take 2  capsules (600 mg total) by mouth 3 (three) times daily.  Patient taking differently: Take 300 mg by mouth 3 (three) times daily.   [name], MD  Expired 07/06/21 2359   Melatonin 3 MG CAPS [id number]  Take 10 mg by mouth at bedtime as needed (sleep).  [provider]  Active Self  metoprolol succinate (TOPROL-XL) 25 MG 24 hr tablet [id number] Yes TAKE 1/2 TABLET(12.5 MG) BY MOUTH DAILY [place] Ranger, [name], DO Taking Active   montelukast (SINGULAIR) 10 MG tablet [id number]  TAKE 1 TABLET(10 MG) BY MOUTH AT BEDTIME Beth Hauser, DO  Active   multivitamin-iron-minerals-folic acid (CENTRUM) chewable tablet [id number]  Chew 1 tablet by mouth daily. [provider]  Active   potassium chloride (KLOR-CON) 10 MEQ tablet [id number] Yes Take 1 tablet (10 mEq total) by mouth daily. Beth Hauser, DO Taking Active   QUEtiapine (SEROQUEL) 300 MG tablet [id number] Yes Take 0.5 tablets by mouth at bedtime. [provider] Taking Active   Donnal Debar 200-62.5-25 MCG/ACT AEPB [id number] Yes INHALE 1 PUFF INTO THE LUNGS DAILY [name], MD Taking Active   Med List Note Rise Patience, RN 02/12/18 1055): Medication agreement signed 02/12/2018 UDS 02/24/17 03/25/17 PA requested for gabapentin sent SB             Pertinent Labs:  Lab Results  Component Value Date   HGBA1C 5.3 06/26/2017   Lab Results  Component Value Date  CHOL 135 04/10/2018   HDL 59 04/10/2018   LDLCALC 64 04/10/2018   TRIG 50 04/10/2018   CHOLHDL 2.3 04/10/2018   Lab Results  Component Value Date   CREATININE 1.10 (H) 01/01/2021   BUN 19 01/01/2021   NA 143 01/01/2021   K 3.6 01/01/2021   CL 102 01/01/2021   CO2 29 01/01/2021   BP Readings from Last 3 Encounters:  07/12/21 112/69  07/06/21 (!) 152/65  05/15/21 124/68   Pulse Readings from Last 3 Encounters:  07/12/21 91  07/06/21 63  05/15/21 81    SDOH:  (Social Determinants of Health) assessments  and interventions performed:    [place]  Review of patient past medical history, allergies, medications, health status, including review of consultants reports, laboratory and other test data, was performed as part of comprehensive evaluation and provision of chronic care management services.   Care Plan : PharmD - Med Mgmt  Updates made by Rennis Petty, RPH-CPP since 08/13/2021 12:00 AM     Problem: Disease Progression      Long-Range Goal: Disease Progression Prevented or Minimized   Start Date: 04/10/2020  Expected End Date: 07/09/2020  Recent Progress: On track  Priority: High  Note:   Current Barriers:  Chronic Disease Management support, education, and care coordination needs related to previous CVA, COPD, hypertension, hyperlipidemia, depression  Pharmacist Clinical Goal(s):  Over the next 90 days, patient will achieve adherence to monitoring guidelines and medication adherence to achieve therapeutic efficacy through collaboration with PharmD and provider.   Interventions: 1:1 collaboration with Beth Hauser, DO regarding development and update of comprehensive plan of care as evidenced by provider attestation and co-signature Inter-disciplinary care team collaboration (see longitudinal plan of care) Perform chart review. Patient seen by [place] on 6/15. Provider advised patient: Continue fluoxetine 60 mg daily Increase quetiapine 150 mg at night (was on 100 mg) Continue clonazepam 0.5 mg every other day as needed for extreme anxiety Obtain lab (TSH) to rule out medical health issues contributing to her mood symptoms Next appointment- 7/18 at 2:30 for 30 mins, in person Today patient confirms picked up and is using rollater walker Encourage patient to use device consistently to aid with fall prevention  Moderate to Severe COPD:  Patient followed by [facility] in [place] Current treatment: Trelegy 1 puff  daily , rinse after use Patient confirms rinsing after use.  Albuterol inhaler as needed Overnight oxygen as directed by Pulmonology  Hypertension Reports taking: Furosemide 20 mg - 2 tablets (40 mg) daily Potassium chloride 10 mEq daily Metoprolol ER 25 mg - 1/2 tablet (12.5 mg ) daily Reports unable to check home blood pressure recently as current monitor stopped working Encourage patient to obtain new upper arm blood pressure monitor through health plan over the counter benefit Encourage patient monitor home blood pressure, and keep log of BP and bring record to medical appointments Encourage patient to continue monitoring home weight and bring this record to medical appointments Remind patient to follow up with provider if has a weight change greater than established parameters   Patient Goals/Self-Care Activities Over the next 90 days, patient will:  To self administers medications as prescribed Encourage patient to obtain weekly pillbox to use as adherence tool Attends all scheduled provider appointments Calls pharmacy for medication refills Calls provider office for new concerns or questions  Follow Up Plan: CM Pharmacist will outreach to patient by telephone next on 10/31/2021 at 10:45 AM  Wallace Cullens, PharmD, [place], CPP Clinical Pharmacist [facility] [phone]

## 2021-08-14 ENCOUNTER — Ambulatory Visit (INDEPENDENT_AMBULATORY_CARE_PROVIDER_SITE_OTHER): Payer: Medicare Other | Admitting: Psychiatry

## 2021-08-14 ENCOUNTER — Encounter: Payer: Self-pay | Admitting: Psychiatry

## 2021-08-14 VITALS — BP 109/68 | HR 87 | Temp 98.3°F | Wt 149.0 lb

## 2021-08-14 DIAGNOSIS — F330 Major depressive disorder, recurrent, mild: Secondary | ICD-10-CM

## 2021-08-14 DIAGNOSIS — F4310 Post-traumatic stress disorder, unspecified: Secondary | ICD-10-CM | POA: Diagnosis not present

## 2021-08-14 DIAGNOSIS — F411 Generalized anxiety disorder: Secondary | ICD-10-CM

## 2021-08-14 MED ORDER — QUETIAPINE FUMARATE 200 MG PO TABS
200.0000 mg | ORAL_TABLET | Freq: Every day | ORAL | 0 refills | Status: DC
Start: 2021-08-14 — End: 2021-09-17

## 2021-08-14 MED ORDER — LORAZEPAM 0.5 MG PO TABS: 0.5000 mg | ORAL_TABLET | ORAL | 0 refills | Status: AC | PRN

## 2021-08-14 NOTE — Patient Instructions (Signed)
Continue fluoxetine 60 mg daily Increase quetiapine 200 mg at night  Start lorazepam 0.5 mg every other day as needed for extreme anxiety  Discontinue clonazepam Next appointment- 8/17 at 3:30, in person

## 2021-08-17 ENCOUNTER — Other Ambulatory Visit: Payer: Self-pay | Admitting: Student in an Organized Health Care Education/Training Program

## 2021-08-17 DIAGNOSIS — G894 Chronic pain syndrome: Secondary | ICD-10-CM

## 2021-08-17 DIAGNOSIS — M25522 Pain in left elbow: Secondary | ICD-10-CM

## 2021-08-21 ENCOUNTER — Other Ambulatory Visit: Payer: Self-pay | Admitting: Family Medicine

## 2021-08-21 DIAGNOSIS — I10 Essential (primary) hypertension: Secondary | ICD-10-CM

## 2021-08-22 NOTE — Telephone Encounter (Signed)
Requested Prescriptions  Pending Prescriptions Disp Refills  . metoprolol succinate (TOPROL-XL) 25 MG 24 hr tablet [Pharmacy Med Name: METOPROLOL ER SUCCINATE '25MG'$  TABS] 45 tablet 0    Sig: TAKE 1/2 TABLET(12.5 MG) BY MOUTH DAILY     Cardiovascular:  Beta Blockers Passed - 08/21/2021 10:03 AM      Passed - Last BP in normal range    BP Readings from Last 1 Encounters:  07/06/21 (!) 152/65         Passed - Last Heart Rate in normal range    Pulse Readings from Last 1 Encounters:  07/06/21 63         Passed - Valid encounter within last 6 months    Recent Outpatient Visits          3 months ago Recurrent falls   [place], DO   6 months ago Chronic diastolic congestive heart failure [facility])   [place], DO   9 months ago Major depressive disorder, recurrent, severe w/o psychotic behavior ([place])   [place], DO   1 year ago Major depressive disorder, recurrent, severe w/o psychotic behavior ([place])   [facility] [name], DO   1 year ago [facility] [place], Lupita Raider, FNP      Future Appointments            In 1 month [facility] [place]

## 2021-08-26 DIAGNOSIS — J441 Chronic obstructive pulmonary disease with (acute) exacerbation: Secondary | ICD-10-CM | POA: Diagnosis not present

## 2021-08-27 DIAGNOSIS — J449 Chronic obstructive pulmonary disease, unspecified: Secondary | ICD-10-CM | POA: Diagnosis not present

## 2021-08-27 DIAGNOSIS — I10 Essential (primary) hypertension: Secondary | ICD-10-CM | POA: Diagnosis not present

## 2021-08-30 ENCOUNTER — Ambulatory Visit
Payer: Medicare Other | Attending: Student in an Organized Health Care Education/Training Program | Admitting: Student in an Organized Health Care Education/Training Program

## 2021-08-30 ENCOUNTER — Encounter: Payer: Self-pay | Admitting: Student in an Organized Health Care Education/Training Program

## 2021-08-30 VITALS — BP 116/54 | HR 67 | Temp 97.3°F | Resp 18 | Ht 65.0 in | Wt 143.0 lb

## 2021-08-30 DIAGNOSIS — M47816 Spondylosis without myelopathy or radiculopathy, lumbar region: Secondary | ICD-10-CM | POA: Diagnosis not present

## 2021-08-30 DIAGNOSIS — G894 Chronic pain syndrome: Secondary | ICD-10-CM | POA: Diagnosis not present

## 2021-08-30 MED ORDER — MELOXICAM 7.5 MG PO TABS: 7.5000 mg | ORAL_TABLET | Freq: Every day | ORAL | 0 refills | Status: AC

## 2021-08-30 NOTE — Progress Notes (Signed)
Safety precautions to be maintained throughout the outpatient stay will include: orient to surroundings, keep bed in low position, maintain call bell within reach at all times, provide assistance with transfer out of bed and ambulation.  

## 2021-08-30 NOTE — Progress Notes (Signed)
PROVIDER NOTE: Information contained herein reflects review and annotations entered in association with encounter. Interpretation of such information and data should be left to medically-trained personnel. Information provided to patient can be located elsewhere in the medical record under "Patient Instructions". Document created using STT-dictation technology, any transcriptional errors that may result from process are unintentional.    Patient: Beth [name]  Service Category: E/M  Provider: [name], MD  DOB: 1945/03/21  DOS: 08/30/2021  Specialty: Interventional Pain Management  MRN: [id number]  Setting: Ambulatory outpatient  PCP: [name], DO  Type: Established Patient    Referring Provider: Nobie Putnam *  Location: Office  Delivery: Face-to-face     HPI  Beth [name], a 76 y.o. year old female, is here today because of her Lumbar facet arthropathy [M47.816]. Beth [name]'s primary complain today is Back Pain  Last encounter: My last encounter with her was on 02/03/20 Pertinent problems: Beth [name] has Fibromyalgia syndrome; Lumbar spondylosis; Lumbar facet arthropathy; Post-traumatic osteoarthritis of left elbow; Lumbar degenerative disc disease; Elbow pain, left; and Coccydynia on their pertinent problem list. Pain Assessment: Severity of Chronic pain is reported as a 8 /10. Location: Back Lower/radiates down both legs in the back to knee. Onset: More than a month ago. Quality: Aching. Timing: Constant. Modifying factor(s): meds, rest. Vitals:  height is '5\' 5"'  (1.651 m) and weight is 143 lb (64.9 kg). Her temperature is 97.3 F (36.3 C) (abnormal). Her blood pressure is 116/54 (abnormal) and her pulse is 67. Her respiration is 18 and oxygen saturation is 98%.   Reason for encounter: worsening of previously known (established) problem  Beth [name] ablations today for increased low back pain secondary to lumbar facet arthropathy, lumbar facet joint  syndrome. She is status post right L3, L4, L5 RFA September 04, 2020, left L3, L4, L5 RFA September 25, 2020.  This provided her with approximately 80% pain relief for about 10 months with gradual return of her pain thereafter. She states that she is having increased low back pain. This is impacting her ADLs and quality of life, we discussed repeating lumbar radiofrequency ablation of bilateral L3, L4, L5. Recommend short course of meloxicam until she is able to get in for RFA. Of note, patient did have pyelonephritis for which she went to the ED on 07/06/2021.  She states that this presented with an increase in her back pain as well.  She states that she received antibiotics by her overall low back pain is higher than it was previously.   ROS  Constitutional: Denies any fever or chills Gastrointestinal: No reported hemesis, hematochezia, vomiting, or acute GI distress Musculoskeletal:  Increased low back pain, pain with facet loading Neurological: No reported episodes of acute onset apraxia, aphasia, dysarthria, agnosia, amnesia, paralysis, loss of coordination, or loss of consciousness  Medication Review  FLUoxetine, Fluticasone-Umeclidin-Vilant, LORazepam, Melatonin, QUEtiapine, acetaminophen, albuterol, aspirin, atorvastatin, furosemide, gabapentin, meloxicam, metoprolol succinate, montelukast, multivitamin-iron-minerals-folic acid, and potassium chloride  History Review  Allergy: Beth [name] is allergic to bextra  [valdecoxib], compazine [prochlorperazine edisylate], lithium carbonate, and lyrica [pregabalin]. Drug: Beth [name]  reports no history of drug use. Alcohol:  reports no history of alcohol use. Tobacco:  reports that she quit smoking about 10 months ago. Her smoking use included cigarettes. She has a 14.25 pack-year smoking history. She has never used smokeless tobacco. Social: Beth [name]  reports that she quit smoking about 10 months ago. Her smoking use included cigarettes. She  has a 14.25 pack-year  smoking history. She has never used smokeless tobacco. She reports that she does not drink alcohol and does not use drugs. Medical:  has a past medical history of Allergy, Anxiety, Asthma, Chronic pain syndrome, COPD (chronic obstructive pulmonary disease) ([place]), CVA (cerebral infarction), Depression, Fibromyalgia, Headache, Hyperlipidemia, Hypertension, IBS (irritable bowel syndrome), Stroke ([place]), and Vitamin D deficiency. Surgical: Beth [name]  has a past surgical history that includes Breast surgery (2011); Cervical discectomy; Appendectomy; Abdominal hysterectomy; Cholecystectomy; Sinusotomy; and Breast excisional biopsy (2011). Family: family history includes Alcohol abuse in her father; Anxiety disorder in her mother; Bipolar disorder in her son; Breast cancer (age of onset: 75) in her mother; Cancer in her father; Depression in her father and mother; Gallbladder disease in her father.  Laboratory Chemistry Profile   Renal Lab Results  Component Value Date   BUN 19 01/01/2021   CREATININE 1.10 (H) 01/01/2021   BCR 17 01/01/2021   GFRAA 63 01/14/2020   GFRNONAA 55 (L) 01/14/2020    Hepatic Lab Results  Component Value Date   AST 23 01/14/2020   ALT 27 01/14/2020   ALBUMIN 3.9 08/17/2019   ALKPHOS 98 08/17/2019   LIPASE 27 02/13/2016    Electrolytes Lab Results  Component Value Date   NA 143 01/01/2021   K 3.6 01/01/2021   CL 102 01/01/2021   CALCIUM 9.3 01/01/2021   MG 1.8 12/25/2012    Bone Lab Results  Component Value Date   VD25OH 37 03/21/2016    Inflammation (CRP: Acute Phase) (ESR: Chronic Phase) Lab Results  Component Value Date   CRP 0.3 03/21/2016   ESRSEDRATE 1 03/21/2016   LATICACIDVEN 1.3 12/23/2015         Note: Above Lab results reviewed.  Recent Imaging Review  VAS [place] ABI WITH/WO TBI  LOWER EXTREMITY DOPPLER STUDY  Patient Name:  Beth [name]  Date of Exam:   06/08/2021 Medical Rec #: [id number]         Accession #:     [id number] Date of Birth: 1945-02-20         Patient Gender: F Patient Age:   16 years Exam Location:  [facility] Procedure:      VAS [place] ABI WITH/WO TBI Referring Phys: Leotis Pain  --------------------------------------------------------------------------------   Indications: Rest pain.   Performing Technologist: Almira Coaster RVS    Examination Guidelines: A complete evaluation includes at minimum, Doppler waveform signals and systolic blood pressure reading at the level of bilateral brachial, anterior tibial, and posterior tibial arteries, when vessel segments are accessible. Bilateral testing is considered an integral part of a complete examination. Photoelectric Plethysmograph (PPG) waveforms and toe systolic pressure readings are included as required and additional duplex testing as needed. Limited examinations for reoccurring indications may be performed as noted.    ABI Findings: +---------+------------------+-----+---------+--------+ Right    Rt Pressure (mmHg)IndexWaveform Comment  +---------+------------------+-----+---------+--------+ Brachial 134                                      +---------+------------------+-----+---------+--------+ ATA      124               0.93 triphasic         +---------+------------------+-----+---------+--------+ PTA      149               1.11 triphasic         +---------+------------------+-----+---------+--------+ [facility]  0.87 Normal            +---------+------------------+-----+---------+--------+  +---------+------------------+-----+---------+-------+ Left     Lt Pressure (mmHg)IndexWaveform Comment +---------+------------------+-----+---------+-------+ Brachial 133                                     +---------+------------------+-----+---------+-------+ ATA      154               1.15 triphasic         +---------+------------------+-----+---------+-------+ PTA      168               1.25 triphasic        +---------+------------------+-----+---------+-------+ Great Toe120               0.90 Normal           +---------+------------------+-----+---------+-------+  +-------+-----------+-----------+------------+------------+ ABI/TBIToday's ABIToday's TBIPrevious ABIPrevious TBI +-------+-----------+-----------+------------+------------+ Right  1.11       .87                                 +-------+-----------+-----------+------------+------------+ Left   1.25       .90                                 +-------+-----------+-----------+------------+------------+    Summary: Right: Resting right ankle-brachial index is within normal range. No evidence of significant right lower extremity arterial disease. The right toe-brachial index is normal.  Left: Resting left ankle-brachial index is within normal range. No evidence of significant left lower extremity arterial disease. The left toe-brachial index is normal.    *See table(s) above for measurements and observations.    Electronically signed by Leotis Pain MD on 06/14/2021 at 12:21:04 PM.      Final    Note: Reviewed        Physical Exam  General appearance: Well nourished, well developed, and well hydrated. In no apparent acute distress Mental status: Alert, oriented x 3 (person, place, & time)       Respiratory: No evidence of acute respiratory distress Eyes: PERLA Vitals: BP (!) 116/54   Pulse 67   Temp (!) 97.3 F (36.3 C)   Resp 18   Ht '5\' 5"'  (1.651 m)   Wt 143 lb (64.9 kg)   SpO2 98%   BMI 23.80 kg/m  BMI: Estimated body mass index is 23.8 kg/m as calculated from the following:   Height as of this encounter: '5\' 5"'  (1.651 m).   Weight as of this encounter: 143 lb (64.9 kg). Ideal: Ideal body weight: 57 kg (125 lb 10.6 oz) Adjusted ideal body weight: 60.1 kg (132 lb 9.6 oz)  Left elbow  pain, mild swelling  Lumbar Spine Area Exam  Skin & Axial Inspection: No masses, redness, or swelling Alignment: Symmetrical Functional ROM: Pain restricted ROM       Stability: No instability detected Muscle Tone/Strength: Functionally intact. No obvious neuro-muscular anomalies detected. Sensory (Neurological): Musculoskeletal pain pattern Palpation: No palpable anomalies       Provocative Tests: Hyperextension/rotation test: (+) bilaterally for facet joint pain. Lumbar quadrant test (Kemp's test): (+) bilaterally for facet joint pain.   Lower Extremity Exam    Side: Right lower extremity  Side: Left lower extremity  Stability: No instability observed  Stability: No instability observed          Skin & Extremity Inspection: Skin color, temperature, and hair growth are WNL. No peripheral edema or cyanosis. No masses, redness, swelling, asymmetry, or associated skin lesions. No contractures.  Skin & Extremity Inspection: Skin color, temperature, and hair growth are WNL. No peripheral edema or cyanosis. No masses, redness, swelling, asymmetry, or associated skin lesions. No contractures.  Functional ROM: Pain restricted ROM                  Functional ROM: Pain restricted ROM                  Muscle Tone/Strength: Functionally intact. No obvious neuro-muscular anomalies detected.  Muscle Tone/Strength: Functionally intact. No obvious neuro-muscular anomalies detected.  Sensory (Neurological): Unimpaired        Sensory (Neurological): Unimpaired        DTR: Patellar: deferred today Achilles: deferred today Plantar: deferred today  DTR: Patellar: deferred today Achilles: deferred today Plantar: deferred today  Palpation: No palpable anomalies  Palpation: No palpable anomalies    Assessment   Status Diagnosis  Having a Flare-up Having a Flare-up Having a Flare-up 1. Lumbar facet arthropathy   2. Lumbar spondylosis   3. Chronic pain syndrome         Plan of Care     [name] has a current medication list which includes the following long-term medication(s): atorvastatin, fluoxetine, furosemide, gabapentin, metoprolol succinate, montelukast, potassium chloride, and quetiapine.  1.  Repeat lumbar radiofrequency ablation for pain related to lumbar facet arthropathy.   2.  Meloxicam as below.  Pharmacotherapy (Medications Ordered): Meds ordered this encounter  Medications   meloxicam (MOBIC) 7.5 MG tablet    Sig: Take 1 tablet (7.5 mg total) by mouth daily after breakfast.    Dispense:  30 tablet    Refill:  0   Orders:  Orders Placed This Encounter  Procedures   Radiofrequency,Lumbar    Standing Status:   Future    Standing Expiration Date:   11/30/2021    Scheduling Instructions:     Left L3,4,5 IV versed in clinic    Order Specific Question:   Where will this procedure be performed?    Answer:   [facility]   Radiofrequency,Lumbar    Standing Status:   Future    Standing Expiration Date:   11/30/2021    Scheduling Instructions:     RIGHT  L3,4,5  IV Versed in clinic 2 weeks after LEFT    Order Specific Question:   Where will this procedure be performed?    Answer:   [facility]   Follow-up plan:   Return in about 2 weeks (around 09/13/2021) for Left L3, 4, 5 RFA, in clinic IV Versed.    Recent Visits No visits were found meeting these conditions. Showing recent visits within past 90 days and meeting all other requirements Today's Visits Date Type Provider Dept  08/30/21 Office Visit [name], MD [facility]  Showing today's visits and meeting all other requirements Future Appointments No visits were found meeting these conditions. Showing future appointments within next 90 days and meeting all other requirements  I discussed the assessment and treatment plan with the patient. The patient was provided an opportunity to ask questions and all were answered. The patient agreed with the plan  and demonstrated an understanding of the instructions.  Patient advised to call back or seek an in-person evaluation  if the symptoms or condition worsens.  Duration of encounter: 65mnutes.  Note by: BGillis Santa MD Date: 08/30/2021; Time: 11:24 AM

## 2021-08-30 NOTE — Patient Instructions (Signed)
Facet Blocks Patient Information  Description: The facets are joints in the spine between the vertebrae.  Like any joints in the body, facets can become irritated and painful.  Arthritis can also effect the facets.  By injecting steroids and local anesthetic in and around these joints, we can temporarily block the nerve supply to them.  Steroids act directly on irritated nerves and tissues to reduce selling and inflammation which often leads to decreased pain.  Facet blocks may be done anywhere along the spine from the neck to the low back depending upon the location of your pain.   After numbing the skin with local anesthetic (like Novocaine), a small needle is passed onto the facet joints under x-ray guidance.  You may experience a sensation of pressure while this is being done.  The entire block usually lasts about 15-25 minutes.   Conditions which may be treated by facet blocks:  Low back/buttock pain Neck/shoulder pain Certain types of headaches  Preparation for the injection:  Do not eat any solid food or dairy products within 8 hours of your appointment. You may drink clear liquid up to 3 hours before appointment.  Clear liquids include water, black coffee, juice or soda.  No milk or cream please. You may take your regular medication, including pain medications, with a sip of water before your appointment.  Diabetics should hold regular insulin (if taken separately) and take 1/2 normal NPH dose the morning of the procedure.  Carry some sugar containing items with you to your appointment. A driver must accompany you and be prepared to drive you home after your procedure. Bring all your current medications with you. An IV may be inserted and sedation may be given at the discretion of the physician. A blood pressure cuff, EKG and other monitors will often be applied during the procedure.  Some patients may need to have extra oxygen administered for a short period. You will be asked to  provide medical information, including your allergies and medications, prior to the procedure.  We must know immediately if you are taking blood thinners (like Coumadin/Warfarin) or if you are allergic to IV iodine contrast (dye).  We must know if you could possible be pregnant.  Possible side-effects:  Bleeding from needle site Infection (rare, may require surgery) Nerve injury (rare) Numbness & tingling (temporary) Difficulty urinating (rare, temporary) Spinal headache (a headache worse with upright posture) Light-headedness (temporary) Pain at injection site (serveral days) Decreased blood pressure (rare, temporary) Weakness in arm/leg (temporary) Pressure sensation in back/neck (temporary)   Call if you experience:  Fever/chills associated with headache or increased back/neck pain Headache worsened by an upright position New onset, weakness or numbness of an extremity below the injection site Hives or difficulty breathing (go to the emergency room) Inflammation or drainage at the injection site(s) Severe back/neck pain greater than usual New symptoms which are concerning to you  Please note:  Although the local anesthetic injected can often make your back or neck feel good for several hours after the injection, the pain will likely return. It takes 3-7 days for steroids to work.  You may not notice any pain relief for at least one week.  If effective, we will often do a series of 2-3 injections spaced 3-6 weeks apart to maximally decrease your pain.  After the initial series, you may be a candidate for a more permanent nerve block of the facets.  If you have any questions, please call [phone] [facility]   Pain Clinic 

## 2021-09-04 ENCOUNTER — Telehealth: Payer: Self-pay | Admitting: Student in an Organized Health Care Education/Training Program

## 2021-09-04 ENCOUNTER — Other Ambulatory Visit: Payer: Self-pay | Admitting: *Deleted

## 2021-09-04 DIAGNOSIS — G894 Chronic pain syndrome: Secondary | ICD-10-CM

## 2021-09-04 DIAGNOSIS — M25522 Pain in left elbow: Secondary | ICD-10-CM

## 2021-09-04 NOTE — Telephone Encounter (Signed)
Patient was just here 08-30-21 for MM appt. States Dr. Gita Kudo did not send in her gabapentin? Please ask if he will send refills in for this medication.

## 2021-09-04 NOTE — Telephone Encounter (Signed)
Rx request sent to Dr. Lateef 

## 2021-09-05 ENCOUNTER — Other Ambulatory Visit: Payer: Self-pay | Admitting: *Deleted

## 2021-09-05 DIAGNOSIS — M25522 Pain in left elbow: Secondary | ICD-10-CM

## 2021-09-05 DIAGNOSIS — G894 Chronic pain syndrome: Secondary | ICD-10-CM

## 2021-09-05 MED ORDER — GABAPENTIN 300 MG PO CAPS: 900.0000 mg | ORAL_CAPSULE | Freq: Three times a day (TID) | ORAL | 5 refills | Status: DC

## 2021-09-05 NOTE — Telephone Encounter (Signed)
Patient notified per voicemail. 

## 2021-09-10 ENCOUNTER — Other Ambulatory Visit: Payer: Self-pay | Admitting: Psychiatry

## 2021-09-10 ENCOUNTER — Other Ambulatory Visit: Payer: Self-pay | Admitting: Family Medicine

## 2021-09-10 DIAGNOSIS — J3089 Other allergic rhinitis: Secondary | ICD-10-CM

## 2021-09-10 NOTE — Telephone Encounter (Signed)
Requested Prescriptions  Pending Prescriptions Disp Refills  . montelukast (SINGULAIR) 10 MG tablet [Pharmacy Med Name: MONTELUKAST '10MG'$  TABLETS] 90 tablet 1    Sig: TAKE 1 TABLET(10 MG) BY MOUTH AT BEDTIME     Pulmonology:  Leukotriene Inhibitors Passed - 09/10/2021 10:04 AM      Passed - Valid encounter within last 12 months    Recent Outpatient Visits          3 months ago Recurrent falls   [place], DO   6 months ago Chronic diastolic congestive heart failure [facility])   [place], DO   10 months ago Major depressive disorder, recurrent, severe w/o psychotic behavior ([place])   [place], DO   1 year ago Major depressive disorder, recurrent, severe w/o psychotic behavior ([place])   [facility] [name], DO   1 year ago [facility] [place], Lupita Raider, FNP      Future Appointments            In 2 weeks [facility], [facility]

## 2021-09-11 ENCOUNTER — Telehealth: Payer: Self-pay

## 2021-09-11 NOTE — Telephone Encounter (Signed)
Will plan to order after the visit on 8/17.

## 2021-09-11 NOTE — Telephone Encounter (Signed)
received fax requesting a refill on the clonazepam .'5mg'$ .  pt last seen on 08-14-21 and next appt 09-13-21
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
## 2021-09-13 ENCOUNTER — Ambulatory Visit: Payer: Medicare Other | Admitting: Psychiatry

## 2021-09-17 ENCOUNTER — Ambulatory Visit: Payer: Self-pay | Admitting: *Deleted

## 2021-09-17 ENCOUNTER — Telehealth: Payer: Self-pay | Admitting: Psychiatry

## 2021-09-17 ENCOUNTER — Telehealth: Payer: Self-pay | Admitting: Student in an Organized Health Care Education/Training Program

## 2021-09-17 ENCOUNTER — Telehealth: Payer: Medicare Other

## 2021-09-17 ENCOUNTER — Other Ambulatory Visit: Payer: Self-pay | Admitting: Psychiatry

## 2021-09-17 ENCOUNTER — Ambulatory Visit: Payer: Self-pay

## 2021-09-17 ENCOUNTER — Ambulatory Visit
Admission: EM | Admit: 2021-09-17 | Discharge: 2021-09-17 | Disposition: A | Discharge status: Home / Self Care | Payer: Medicare Other | Attending: Emergency Medicine | Admitting: Emergency Medicine

## 2021-09-17 DIAGNOSIS — N3001 Acute cystitis with hematuria: Secondary | ICD-10-CM | POA: Insufficient documentation

## 2021-09-17 DIAGNOSIS — B001 Herpesviral vesicular dermatitis: Secondary | ICD-10-CM | POA: Diagnosis present

## 2021-09-17 LAB — URINALYSIS, ROUTINE W REFLEX MICROSCOPIC
Bilirubin Urine: NEGATIVE
Glucose, UA: NEGATIVE mg/dL
Ketones, ur: NEGATIVE mg/dL
Nitrite: NEGATIVE
Protein, ur: NEGATIVE mg/dL
Specific Gravity, Urine: 1.01 (ref 1.005–1.030)
pH: 5.5 (ref 5.0–8.0)

## 2021-09-17 LAB — URINALYSIS, MICROSCOPIC (REFLEX)

## 2021-09-17 MED ORDER — LORAZEPAM 0.5 MG PO TABS
0.5000 mg | ORAL_TABLET | Freq: Every day | ORAL | 0 refills | Status: DC | PRN
Start: 2021-09-17 — End: 2021-09-17

## 2021-09-17 MED ORDER — ONDANSETRON 4 MG PO TBDP: ORAL_TABLET | ORAL | 0 refills | Status: DC

## 2021-09-17 MED ORDER — VALACYCLOVIR HCL 1 G PO TABS
2000.0000 mg | ORAL_TABLET | Freq: Two times a day (BID) | ORAL | 0 refills | Status: DC
Start: 2021-09-17 — End: 2021-10-31

## 2021-09-17 MED ORDER — CEPHALEXIN 500 MG PO CAPS: 500.0000 mg | ORAL_CAPSULE | Freq: Two times a day (BID) | ORAL | 0 refills | Status: DC

## 2021-09-17 MED ORDER — LORAZEPAM 0.5 MG PO TABS: 0.5000 mg | ORAL_TABLET | ORAL | 0 refills | Status: DC | PRN

## 2021-09-17 MED ORDER — QUETIAPINE FUMARATE 200 MG PO TABS: 200.0000 mg | ORAL_TABLET | Freq: Every day | ORAL | 0 refills | Status: DC

## 2021-09-17 NOTE — Patient Instructions (Signed)
Visit Information  Thank you for taking time to visit with me today. Please don't hesitate to contact me if I can be of assistance to you.   Following are the goals we discussed today:   Goals Addressed             This Visit's Progress    RNCM: "I think I have a Urinary Track Infection"       Care Coordination Interventions: Evaluation of current treatment plan related to possible UTI and patient's adherence to plan as established by provider Advised patient to go to the urgent care to be evaluated. The patient states that her son is coming to take her to the urgent care because she could not get in to see the pcp today.  Provided education to patient re: monitoring for fever, changes in temperature tolerance, increased urgency and burning on urination, chills, and other sx and sx of infection Reviewed medications with patient and discussed compliance with the patient and if she is given and antibiotic to make sure she takes all of the medication Provided patient with sx and sx of UTI educational materials related to possible UTI Discussed plans with patient for ongoing care management follow up and provided patient with direct contact information for care management team Advised patient to discuss changes in her condition and changes in her urinary health with provider Screening for signs and symptoms of depression related to chronic disease state  Assessed social determinant of health barriers           Our next appointment is by telephone on 10-08-2021 at 1 pm  Please call the care guide team at [phone] if you need to cancel or reschedule your appointment.   If you are experiencing a Mental Health or [place] or need someone to talk to, please call the Suicide and Crisis Lifeline: 988 call the [place] National Suicide Prevention Lifeline: (401) 601-2003 or TTY: 343-382-4284 TTY 848-501-3128) to talk to a trained counselor call 1-800-273-TALK (toll free, 24  hour hotline)  The patient verbalized understanding of instructions, educational materials, and care plan provided today and DECLINED offer to receive copy of patient instructions, educational materials, and care plan.   Telephone follow up appointment with care management team member scheduled for: 10-08-2021 at 1 pm  [place], MSN, [facility] Mobile: [phone]

## 2021-09-17 NOTE — ED Triage Notes (Signed)
Patient reports that she is having trouble urinating and she has been nausea for about a week.   Patient reports burning with urination.

## 2021-09-17 NOTE — Telephone Encounter (Signed)
Patient called stating she is very sick and unable to come for appt on 09-19-21 and 10-03-21. She is going to see pcp and will call back to reschedule. Both appts had been authorized. [employer] will need to be notified if patient reschedules.

## 2021-09-17 NOTE — Discharge Instructions (Addendum)
You have a urinary tract infection.  I have sent your urine off for culture to make sure that we have you on the right antibiotic.  Finish the Keflex, even feel better.  Push plenty of extra electrolyte containing fluids such as Pedialyte.  You can try the Zofran as needed for nausea.  I suspect your nausea is coming from a urinary tract infection.  If your nausea does not improve after you finish the antibiotics, you will need to follow-up with your primary care provider.  take the Valtrex for the cold sore.

## 2021-09-17 NOTE — Telephone Encounter (Signed)
Ordered quetiapine. Clonazepam was switched to lorazepam- I sent in refill of lorazepam, fyi.

## 2021-09-17 NOTE — Telephone Encounter (Signed)
Per agent" "Pt called in to schedule an appt. Pt says that she has been experiencing nausea for a week. Not showing any openings at [facility] this week. Pt would like to discuss further" Chief Complaint: Nausea, dysuria Symptoms: Pt called with nausea, further assessment does report dysuria, frequency,lower abdominal pain, comes and goes. States "Feels like I'm not emptying bladder." Frequency: 10 days ago Pertinent Negatives: Patient denies fever Disposition: '[]'$ ED /'[x]'$ Urgent Care (no appt availability in office) / '[]'$ Appointment(In office/virtual)/ '[]'$  [place] [facility]/ '[]'$ Home Care/ '[]'$ Refused Recommended Disposition /'[]'$ [facility]/ '[]'$  Follow-up with PCP Additional Notes: Pt requesting something be called in for her. Advised would need to be seen, no availability. Pt cannot use E-Mail for [employer]. Advised UC. States "I'll see if my son can take me." Reiterated need for eval. Verbalizes understanding. CAre advise provided  Reason for Disposition  Urinating more frequently than usual (i.e., frequency)  Answer Assessment - Initial Assessment Questions 1. SYMPTOM: "What's the main symptom you're concerned about?" (e.g., frequency, incontinence)     Nausea and UTI 2. ONSET: "When did the    start?"     10 days ago 3. PAIN: "Is there any pain?" If Yes, ask: "How bad is it?" (Scale: 1-10; mild, moderate, severe)     Pain and burning with urination, nausea, frequency 4. CAUSE: "What do you think is causing the symptoms?"     UTI 5. OTHER SYMPTOMS: "Do you have any other symptoms?" (e.g., blood in urine, fever, flank pain, pain with urination)     Pain,stomach below navel, comes and goes. Urinating more frequently but don't feel like Im emptying.  Answer Assessment - Initial Assessment Questions 1. NAUSEA SEVERITY: "How bad is the nausea?" (e.g., mild, moderate, severe; dehydration, weight loss)   - MILD: loss of appetite without change in eating habits   - MODERATE: decreased oral  intake without significant weight loss, dehydration, or malnutrition   - SEVERE: inadequate caloric or fluid intake, significant weight loss, symptoms of dehydration      2. ONSET: "When did the nausea begin?"     10 days ago 3. VOMITING: "Any vomiting?" If Yes, ask: "How many times today?"     No 4. RECURRENT SYMPTOM: "Have you had nausea before?" If Yes, ask: "When was the last time?" "What happened that time?"      5. CAUSE: "What do you think is causing the nausea?"     UTI, burning with urination, retention  Protocols used: Nausea-A-AH, Urinary Symptoms-A-AH

## 2021-09-17 NOTE — Telephone Encounter (Signed)
Patient missed appt on 09-13-21 thought it was for a Friday. Rescheduled to 10-08-21. Stated [facility] in [place] lost the prescription for Quetiapine and needs resent also needs refill on clonaszpan. Please advise.

## 2021-09-17 NOTE — Patient Outreach (Signed)
  Care Coordination   Follow Up Visit Note   09/17/2021 Name: Beth [name] MRN: [id number] DOB: May 15, 1945  Beth [name] is a 76 y.o. year old female who sees Beth Hauser, DO for primary care. I spoke with  Beth [name] by phone today  What matters to the patients health and wellness today?  "I think I have another urinary tract infection". The patients son is coming to take the patient to the urgent care to be evaluated    Goals Addressed             This Visit's Progress    RNCM: "I think I have a Urinary Track Infection"       Care Coordination Interventions: Evaluation of current treatment plan related to possible UTI and patient's adherence to plan as established by provider Advised patient to go to the urgent care to be evaluated. The patient states that her son is coming to take her to the urgent care because she could not get in to see the pcp today.  Provided education to patient re: monitoring for fever, changes in temperature tolerance, increased urgency and burning on urination, chills, and other sx and sx of infection Reviewed medications with patient and discussed compliance with the patient and if she is given and antibiotic to make sure she takes all of the medication Provided patient with sx and sx of UTI educational materials related to possible UTI Discussed plans with patient for ongoing care management follow up and provided patient with direct contact information for care management team Advised patient to discuss changes in her condition and changes in her urinary health with provider Screening for signs and symptoms of depression related to chronic disease state  Assessed social determinant of health barriers           SDOH assessments and interventions completed:  Yes  SDOH Interventions Today    Flowsheet Row Most Recent Value  SDOH Interventions   Food Insecurity Interventions Intervention Not Indicated  Housing  Interventions Intervention Not Indicated  Transportation Interventions Intervention Not Indicated        Care Coordination Interventions Activated:  Yes  Care Coordination Interventions:  Yes, provided   Follow up plan: Follow up call scheduled for 10-08-2021 at 1 pm    Encounter Outcome:  Pt. Visit Completed   Beth Larsson RN, MSN, [place] [facility] Mobile: [phone]

## 2021-09-17 NOTE — ED Provider Notes (Signed)
HPI  SUBJECTIVE:  Beth [name] is a 76 y.o. female who presents with nausea, dysuria, urgency, frequency for the past week.  She reports decreased appetite and unintentional 6 to 7 pound weight loss over the past week.  She denies early satiety.  She reports low midline pelvic pressure with urination.  She denies other abdominal or back pain.  No vomiting, fevers, vaginal odor, bleeding, discharge, itching.  No antibiotics in the past month.  No antipyretic in the past 6 hours.  She tried Tylenol and Pepto-Bismol without improvement in her urinary symptoms or nausea no aggravating or alleviating factors.  She also reports a painful fever blister on her upper lip starting 3 days ago.  Patient has a past medical history of CVA, COPD, hypertension, pulmonary hypertension, congestive heart failure, UTI, herpes labialis.  No history of chronic kidney disease, diabetes, hypertension, pyelonephritis, nephrolithiasis.  PCP: in [place]  Past Medical History:  Diagnosis Date   Allergy    Anxiety    Asthma    Chronic pain syndrome    discharged from pain clinic, hx of narcotics seeking behavior   COPD (chronic obstructive pulmonary disease) (HCC)    CVA (cerebral infarction)    Depression    Fibromyalgia    Headache    Hyperlipidemia    Hypertension    IBS (irritable bowel syndrome)    Stroke ([place])    Vitamin D deficiency     Past Surgical History:  Procedure Laterality Date   ABDOMINAL HYSTERECTOMY     APPENDECTOMY     BREAST EXCISIONAL BIOPSY  2011   Pt states lump removed, ? side, no scar seen   BREAST SURGERY  2011   biopsy   CERVICAL DISCECTOMY     CHOLECYSTECTOMY     SINUSOTOMY      Family History  Problem Relation Age of Onset   Anxiety disorder Mother    Depression Mother    Breast cancer Mother 67   Cancer Father    Gallbladder disease Father    Alcohol abuse Father    Depression Father    Bipolar disorder Son     Social History   Tobacco Use   Smoking status:  Former    Packs/day: 0.25    Years: 57.00    Total pack years: 14.25    Types: Cigarettes    Quit date: 10/24/2020    Years since quitting: 0.8   Smokeless tobacco: Never   Tobacco comments:    6-8cig daily--08/28/2020  Vaping Use   Vaping Use: Never used  Substance Use Topics   Alcohol use: No    Alcohol/week: 0.0 standard drinks of alcohol   Drug use: No    No current facility-administered medications for this encounter.  Current Outpatient Medications:    cephALEXin (KEFLEX) 500 MG capsule, Take 1 capsule (500 mg total) by mouth 2 (two) times daily., Disp: 14 capsule, Rfl: 0   valACYclovir (VALTREX) 1000 MG tablet, Take 2 tablets (2,000 mg total) by mouth every 12 (twelve) hours. X 1 day, Disp: 12 tablet, Rfl: 0   acetaminophen (TYLENOL) 500 MG tablet, Take 500 mg by mouth 2 (two) times daily as needed., Disp: , Rfl:    albuterol (VENTOLIN HFA) 108 (90 Base) MCG/ACT inhaler, Inhale into the lungs every 6 (six) hours as needed for wheezing or shortness of breath., Disp: , Rfl:    aspirin 81 MG chewable tablet, Chew 81 mg by mouth daily., Disp: , Rfl:    atorvastatin (LIPITOR) 40  MG tablet, TAKE 1 TABLET BY MOUTH EVERY DAY, Disp: 90 tablet, Rfl: 3   FLUoxetine (PROZAC) 20 MG capsule, TAKE 3 CAPSULES(60 MG) BY MOUTH DAILY, Disp: 270 capsule, Rfl: 0   furosemide (LASIX) 20 MG tablet, Take 2 tablets (40 mg total) by mouth daily. If swelling is less, can take only 1 pill., Disp: 180 tablet, Rfl: 3   gabapentin (NEURONTIN) 300 MG capsule, Take 3 capsules (900 mg total) by mouth 3 (three) times daily., Disp: 270 capsule, Rfl: 5   LORazepam (ATIVAN) 0.5 MG tablet, Take 1 tablet (0.5 mg total) by mouth every other day as needed (for severe anxiety)., Disp: 15 tablet, Rfl: 0   Melatonin 3 MG CAPS, Take 10 mg by mouth at bedtime as needed (sleep). , Disp: , Rfl:    meloxicam (MOBIC) 7.5 MG tablet, Take 1 tablet (7.5 mg total) by mouth daily after breakfast., Disp: 30 tablet, Rfl: 0   metoprolol  succinate (TOPROL-XL) 25 MG 24 hr tablet, TAKE 1/2 TABLET(12.5 MG) BY MOUTH DAILY, Disp: 45 tablet, Rfl: 0   montelukast (SINGULAIR) 10 MG tablet, TAKE 1 TABLET(10 MG) BY MOUTH AT BEDTIME, Disp: 90 tablet, Rfl: 1   multivitamin-iron-minerals-folic acid (CENTRUM) chewable tablet, Chew 1 tablet by mouth daily., Disp: , Rfl:    potassium chloride (KLOR-CON) 10 MEQ tablet, Take 1 tablet (10 mEq total) by mouth daily., Disp: 90 tablet, Rfl: 3   QUEtiapine (SEROQUEL) 200 MG tablet, Take 1 tablet (200 mg total) by mouth at bedtime., Disp: 30 tablet, Rfl: 0   TRELEGY ELLIPTA 200-62.5-25 MCG/ACT AEPB, INHALE 1 PUFF INTO THE LUNGS DAILY, Disp: 60 each, Rfl: 5  Allergies  Allergen Reactions   Bextra  [Valdecoxib]    Compazine [Prochlorperazine Edisylate]     Stroke-like symptoms   Lithium Carbonate     Leg weakness   Lyrica [Pregabalin]      ROS  As noted in HPI.   Physical Exam  BP (!) 116/51 (BP Location: Left Arm)   Pulse 69   Temp 97.7 F (36.5 C) (Oral)   Ht '5\' 5"'$  (1.651 m)   Wt 64.9 kg   SpO2 94%   BMI 23.81 kg/m   Constitutional: Well developed, well nourished, no acute distress Eyes:  EOMI, conjunctiva normal bilaterally HENT: Normocephalic, atraumatic,mucus membranes moist.  Large fever blister upper lip. Respiratory: Normal inspiratory effort Cardiovascular: Normal rate GI: nondistended, soft.  No suprapubic tenderness.  Mild bilateral flank tenderness.  No other abdominal tenderness.  Back: No CVAT skin: No rash, skin intact Musculoskeletal: no deformities Neurologic: Alert & oriented x 3, no focal neuro deficits Psychiatric: Speech and behavior appropriate   ED Course   Medications - No data to display  Orders Placed This Encounter  Procedures   Urine Culture    Standing Status:   Standing    Number of Occurrences:   1    Order Specific Question:   Indication    Answer:   Dysuria   Urinalysis, Routine w reflex microscopic Urine, Clean Catch    Standing  Status:   Standing    Number of Occurrences:   1   Urinalysis, Microscopic (reflex)    Standing Status:   Standing    Number of Occurrences:   1    Results for orders placed or performed during the hospital encounter of 09/17/21 (from the past 24 hour(s))  Urinalysis, Routine w reflex microscopic Urine, Clean Catch     Status: Abnormal   Collection Time: 09/17/21  2:42  PM  Result Value Ref Range   Color, Urine YELLOW YELLOW   APPearance CLEAR CLEAR   Specific Gravity, Urine 1.010 1.005 - 1.030   pH 5.5 5.0 - 8.0   Glucose, UA NEGATIVE NEGATIVE mg/dL   Hgb urine dipstick TRACE (A) NEGATIVE   Bilirubin Urine NEGATIVE NEGATIVE   Ketones, ur NEGATIVE NEGATIVE mg/dL   Protein, ur NEGATIVE NEGATIVE mg/dL   Nitrite NEGATIVE NEGATIVE   Leukocytes,Ua LARGE (A) NEGATIVE  Urinalysis, Microscopic (reflex)     Status: Abnormal   Collection Time: 09/17/21  2:42 PM  Result Value Ref Range   RBC / HPF 6-10 0 - 5 RBC/hpf   WBC, UA 21-50 0 - 5 WBC/hpf   Bacteria, UA MANY (A) NONE SEEN   Squamous Epithelial / LPF 6-10 0 - 5   No results found.  ED Clinical Impression  1. Acute cystitis with hematuria   2. Herpes labialis      ED Assessment/Plan  1.  Nausea, urinary symptoms.  Patient was unable to provide a urine specimen.  She was given water to drink.  Will reevaluate.  Discussed with patient that the causes of nausea are vast.  Suspect it is from her UTI.  Will send home with Zofran.  She will follow-up with her PCP if Zofran does not improve her symptoms.  Patient appears nontoxic, she has normal vitals.  She has no CVA tenderness.  UA consistent with a UTI with many bacteria  and large leukocytes.  She has trace hematuria.  Last positive urine culture grew out E. coli that was pansensitive.  We will send home with Keflex.  No Pyridium because creatinine clearance less than 50  Calculated creatinine clearance from outside labs done in January 23 46 mL/min.   2.  Herpes labialis.   Home with Valtrex.  Does not need renal dosing.   Discussed labs, MDM, treatment plan, and plan for follow-up with patient. Discussed sn/sx that should prompt return to the ED. patient agrees with plan.   Meds ordered this encounter  Medications   valACYclovir (VALTREX) 1000 MG tablet    Sig: Take 2 tablets (2,000 mg total) by mouth every 12 (twelve) hours. X 1 day    Dispense:  12 tablet    Refill:  0   cephALEXin (KEFLEX) 500 MG capsule    Sig: Take 1 capsule (500 mg total) by mouth 2 (two) times daily.    Dispense:  14 capsule    Refill:  0      *This clinic note was created using [employer]. Therefore, there may be occasional mistakes despite careful proofreading.  ?    Melynda Ripple, MD 09/17/21 [phone]

## 2021-09-18 LAB — URINE CULTURE: Culture: <10000 — AB

## 2021-09-19 ENCOUNTER — Ambulatory Visit: Payer: Medicare Other | Admitting: Student in an Organized Health Care Education/Training Program

## 2021-09-21 ENCOUNTER — Ambulatory Visit (INDEPENDENT_AMBULATORY_CARE_PROVIDER_SITE_OTHER): Payer: Medicare Other | Admitting: Physician Assistant

## 2021-09-21 ENCOUNTER — Encounter: Payer: Self-pay | Admitting: Physician Assistant

## 2021-09-21 VITALS — BP 137/64 | HR 95 | Ht 65.0 in | Wt 138.0 lb

## 2021-09-21 DIAGNOSIS — R39198 Other difficulties with micturition: Secondary | ICD-10-CM

## 2021-09-21 DIAGNOSIS — R110 Nausea: Secondary | ICD-10-CM

## 2021-09-21 DIAGNOSIS — R319 Hematuria, unspecified: Secondary | ICD-10-CM

## 2021-09-21 DIAGNOSIS — N390 Urinary tract infection, site not specified: Secondary | ICD-10-CM | POA: Diagnosis not present

## 2021-09-21 LAB — POCT URINALYSIS DIPSTICK
Glucose, UA: NEGATIVE
Ketones, UA: NEGATIVE
Nitrite, UA: NEGATIVE
Protein, UA: POSITIVE — AB
Spec Grav, UA: 1.015 (ref 1.010–1.025)
Urobilinogen, UA: 0.2 E.U./dL
pH, UA: 5 (ref 5.0–8.0)

## 2021-09-21 MED ORDER — CIPROFLOXACIN HCL 500 MG PO TABS: 500.0000 mg | ORAL_TABLET | Freq: Two times a day (BID) | ORAL | 0 refills | Status: AC

## 2021-09-21 MED ORDER — CIPROFLOXACIN HCL 500 MG PO TABS: 500.0000 mg | ORAL_TABLET | Freq: Two times a day (BID) | ORAL | 0 refills | Status: DC

## 2021-09-21 NOTE — Progress Notes (Signed)
Acute Office Visit   Patient: Beth [name]   DOB: [birth year]   76 y.o. Female  MRN: [id number] Visit Date: 09/21/2021  Today's healthcare provider: Dani Gobble Afton Lavalle, PA-C  Introduced myself to the patient as a Journalist, newspaper and provided education on APPs in clinical practice.    Chief Complaint  Patient presents with   palms itch   Subjective    HPI   States she has been feeling sick for about 2 weeks Reports she has been feeling nauseous and has had reduced appetite for about 2 weeks She has been taking Zofran every 12 hours- provided from UC Reports the last thing she ate was a hot dog and some bread yesterday  Reports she has a hard time swallowing right now She is able to keep water down She denies recent sick contacts  Alleviating: Nothing Aggravating: nothing Interventions: Zofran, Pepto  Stopped taking the Keflex from UC yesterday per instructions  States she takes Lasix but feels like she is not urinating much at all- reports difficulty urinating and intermittent pain with urination  Denies vaginal irritation at this time   Medications: Outpatient Medications Prior to Visit  Medication Sig   acetaminophen (TYLENOL) 500 MG tablet Take 500 mg by mouth 2 (two) times daily as needed.   albuterol (VENTOLIN HFA) 108 (90 Base) MCG/ACT inhaler Inhale into the lungs every 6 (six) hours as needed for wheezing or shortness of breath.   aspirin 81 MG chewable tablet Chew 81 mg by mouth daily.   atorvastatin (LIPITOR) 40 MG tablet TAKE 1 TABLET BY MOUTH EVERY DAY   cephALEXin (KEFLEX) 500 MG capsule Take 1 capsule (500 mg total) by mouth 2 (two) times daily.   FLUoxetine (PROZAC) 20 MG capsule TAKE 3 CAPSULES(60 MG) BY MOUTH DAILY   furosemide (LASIX) 20 MG tablet Take 2 tablets (40 mg total) by mouth daily. If swelling is less, can take only 1 pill.   gabapentin (NEURONTIN) 300 MG capsule Take 3 capsules (900 mg total) by mouth 3 (three) times daily.   LORazepam (ATIVAN)  0.5 MG tablet Take 1 tablet (0.5 mg total) by mouth every other day as needed (for severe anxiety).   Melatonin 3 MG CAPS Take 10 mg by mouth at bedtime as needed (sleep).    meloxicam (MOBIC) 7.5 MG tablet Take 1 tablet (7.5 mg total) by mouth daily after breakfast.   metoprolol succinate (TOPROL-XL) 25 MG 24 hr tablet TAKE 1/2 TABLET(12.5 MG) BY MOUTH DAILY   montelukast (SINGULAIR) 10 MG tablet TAKE 1 TABLET(10 MG) BY MOUTH AT BEDTIME   multivitamin-iron-minerals-folic acid (CENTRUM) chewable tablet Chew 1 tablet by mouth daily.   ondansetron (ZOFRAN-ODT) 4 MG disintegrating tablet 1 tablet 3 times daily as needed nausea   potassium chloride (KLOR-CON) 10 MEQ tablet Take 1 tablet (10 mEq total) by mouth daily.   QUEtiapine (SEROQUEL) 200 MG tablet Take 1 tablet (200 mg total) by mouth at bedtime.   TRELEGY ELLIPTA 200-62.5-25 MCG/ACT AEPB INHALE 1 PUFF INTO THE LUNGS DAILY   valACYclovir (VALTREX) 1000 MG tablet Take 2 tablets (2,000 mg total) by mouth every 12 (twelve) hours. X 1 day   No facility-administered medications prior to visit.    Review of Systems  Constitutional:  Positive for appetite change and chills. Negative for diaphoresis and fever.  HENT:  Negative for congestion and rhinorrhea.   Gastrointestinal:  Positive for diarrhea and nausea. Negative for vomiting.  Genitourinary:  Positive  for difficulty urinating, dysuria and vaginal bleeding (some spotting about a week ago). Negative for hematuria.  Neurological:  Positive for dizziness and headaches.       Objective    BP 137/64   Pulse 95   Ht '5\' 5"'$  (1.651 m)   Wt 138 lb (62.6 kg)   SpO2 98%   BMI 22.96 kg/m    Physical Exam Vitals reviewed.  Constitutional:      Appearance: Normal appearance.  HENT:     Head: Normocephalic and atraumatic.     Mouth/Throat:     Lips: Pink.     Mouth: Mucous membranes are moist.     Dentition: Has dentures.     Pharynx: Oropharynx is clear. Uvula midline.  Eyes:      Extraocular Movements: Extraocular movements intact.     Conjunctiva/sclera: Conjunctivae normal.  Cardiovascular:     Rate and Rhythm: Normal rate and regular rhythm.     Pulses: Normal pulses.     Heart sounds: Normal heart sounds. No murmur heard.    No friction rub. No gallop.  Pulmonary:     Effort: Pulmonary effort is normal. No respiratory distress.     Breath sounds: Normal breath sounds. No stridor. No decreased breath sounds, wheezing, rhonchi or rales.  Abdominal:     General: Abdomen is flat. Bowel sounds are normal.     Palpations: Abdomen is soft.     Tenderness: There is no abdominal tenderness. There is no right CVA tenderness or left CVA tenderness.  Musculoskeletal:     Cervical back: Normal range of motion and neck supple.  Neurological:     Mental Status: She is lethargic.     GCS: GCS eye subscore is 4. GCS verbal subscore is 5. GCS motor subscore is 6.     Cranial Nerves: No dysarthria or facial asymmetry.  Psychiatric:        Attention and Perception: She is inattentive.        Mood and Affect: Mood and affect normal.        Speech: Speech normal.        Behavior: Behavior is slowed. Behavior is cooperative.       Results for orders placed or performed in visit on 09/21/21  POCT Urinalysis Dipstick  Result Value Ref Range   Color, UA Yellow    Clarity, UA Cloudy    Glucose, UA Negative Negative   Bilirubin, UA Moderate ++    Ketones, UA Negative    Spec Grav, UA 1.015 1.010 - 1.025   Blood, UA Large +++    pH, UA 5.0 5.0 - 8.0   Protein, UA Positive (A) Negative   Urobilinogen, UA 0.2 0.2 or 1.0 E.U./dL   Nitrite, UA Negative    Leukocytes, UA Large (3+) (A) Negative   Appearance     Odor      Assessment & Plan      No follow-ups on file.       Problem List Items Addressed This Visit       Genitourinary   Urinary tract infection    Acute, new concern Reports she is having difficulty urinating and intermittent discomfort, denies  hematuria to her knowledge Reports associated nausea and dizziness UA was concerning for UTI- given her symptoms will start Cipro to provide coverage for potential pyelo  Will send sample for culture - results to dictate further management Recommend she stay well hydrated and use Zofran for nausea. Follow up as needed  for persistent or progressing symptoms       Relevant Medications   ciprofloxacin (CIPRO) 500 MG tablet   Other Visit Diagnoses     Nausea    -  Primary Acute, ongoing concern Reports she has been taking Zofran provided by UC every 12 hours or so but reports limited relief Recommend she take more regularly - every 6-8 hours as needed for symptoms  Suspect this is secondary to UTI - will treat per A&P for UTI Recommend increasing water intake and bland diet as tolerated  Follow up as needed - if symptoms not improving or she is not able to tolerate PO intake, recommend ED for IV fluids and stabilization    Relevant Orders   COMPLETE METABOLIC PANEL WITH GFR   CBC w/Diff/Platelet   Difficulty urinating       Relevant Medications   ciprofloxacin (CIPRO) 500 MG tablet   Other Relevant Orders   POCT Urinalysis Dipstick (Completed)   Urine Culture        No follow-ups on file.   I, Biff Rutigliano E Alyxander Kollmann, PA-C, have reviewed all documentation for this visit. The documentation on 09/21/21 for the exam, diagnosis, procedures, and orders are all accurate and complete.   Talitha Givens, MHS, PA-C [facility]

## 2021-09-21 NOTE — Progress Notes (Deleted)
Established Patient Office Visit  Name: Beth [name]   MRN: [id number]    DOB: January 12, 1946   Date:09/21/2021  Today's Provider: Talitha Givens, MHS, PA-C Introduced myself to the patient as a PA-C and provided education on APPs in clinical practice.         Subjective  Chief Complaint  No chief complaint on file.   HPI   Patient Active Problem List   Diagnosis Date Noted   Chronic diastolic congestive heart failure ([place]) 11/14/2020   Rash 01/14/2020   Pulmonary hypertension ([place]) 09/14/2019   Coccydynia 07/29/2019   Cervicalgia 05/04/2019   Acute pain of left shoulder 05/04/2019   Elbow pain, left 05/04/2019   DOE (dyspnea on exertion) 03/23/2019   Leg swelling 03/22/2019    Class: Acute   Lumbar degenerative disc disease 09/10/2018   Left hip pain 09/10/2018   Post-traumatic osteoarthritis of left elbow 07/14/2018   Lumbar radiculopathy 02/12/2018   Lumbar spondylosis 02/12/2018   Lumbar facet arthropathy 02/12/2018   Atherosclerosis of aorta ([place]) 09/17/2017   Lung nodule 09/17/2017   Urinary tract infection 12/05/2016   Protein-calorie malnutrition, mild ([place]) 08/07/2016   Generalized anxiety disorder 07/15/2016   Chronic pain syndrome 07/15/2016   Severe episode of recurrent major depressive disorder, without psychotic features ([place]) 07/14/2016   Moderate benzodiazepine use disorder ([place]) 05/08/2016   Major depressive disorder, recurrent, severe w/o psychotic behavior ([place]) 05/01/2016   Seasonal allergic rhinitis 03/23/2015   Hyperglycemia 03/23/2015   Senile purpura ([place]) 03/23/2015   Perennial allergic rhinitis with seasonal variation 03/23/2015   Marital problems 10/31/2014   Migraine without aura and without status migrainosus, not intractable 10/31/2014   Myofascial pain syndrome of lumbar spine 08/09/2014   Colon polyp 08/09/2014   COPD, severe ([place]) 08/09/2014   CVA, old, hemiparesis ([place]) 08/09/2014   Dyslipidemia 08/09/2014   Dysfunction  of eustachian tube 08/09/2014   Fibromyalgia syndrome 08/09/2014   Gastro-esophageal reflux disease without esophagitis 08/09/2014   Benign migrating glossitis 08/09/2014   Cerebrovascular accident, old 08/09/2014   IBS (irritable bowel syndrome) 08/09/2014   Low back pain with radiation 08/09/2014   Chronic recurrent major depressive disorder ([place]) 62/26/3335   Dysmetabolic syndrome 45/62/5638   OP (osteoporosis) 08/09/2014   Vitamin D deficiency 08/09/2014   Smoking 08/09/2014   Benign hypertension 07/19/2013   Benign neoplasm of skin of trunk 06/03/2013   H/O: pneumonia 09/25/2012    Past Surgical History:  Procedure Laterality Date   ABDOMINAL HYSTERECTOMY     APPENDECTOMY     BREAST EXCISIONAL BIOPSY  2011   Pt states lump removed, ? side, no scar seen   BREAST SURGERY  2011   biopsy   CERVICAL DISCECTOMY     CHOLECYSTECTOMY     SINUSOTOMY      Family History  Problem Relation Age of Onset   Anxiety disorder Mother    Depression Mother    Breast cancer Mother 46   Cancer Father    Gallbladder disease Father    Alcohol abuse Father    Depression Father    Bipolar disorder Son     Social History   Tobacco Use   Smoking status: Former    Packs/day: 0.25    Years: 57.00    Total pack years: 14.25    Types: Cigarettes    Quit date: 10/24/2020    Years since quitting: 0.9   Smokeless tobacco: Never   Tobacco comments:  6-8cig daily--08/28/2020  Substance Use Topics   Alcohol use: No    Alcohol/week: 0.0 standard drinks of alcohol     Current Outpatient Medications:    acetaminophen (TYLENOL) 500 MG tablet, Take 500 mg by mouth 2 (two) times daily as needed., Disp: , Rfl:    albuterol (VENTOLIN HFA) 108 (90 Base) MCG/ACT inhaler, Inhale into the lungs every 6 (six) hours as needed for wheezing or shortness of breath., Disp: , Rfl:    aspirin 81 MG chewable tablet, Chew 81 mg by mouth daily., Disp: , Rfl:    atorvastatin (LIPITOR) 40 MG tablet, TAKE 1  TABLET BY MOUTH EVERY DAY, Disp: 90 tablet, Rfl: 3   cephALEXin (KEFLEX) 500 MG capsule, Take 1 capsule (500 mg total) by mouth 2 (two) times daily., Disp: 14 capsule, Rfl: 0   FLUoxetine (PROZAC) 20 MG capsule, TAKE 3 CAPSULES(60 MG) BY MOUTH DAILY, Disp: 270 capsule, Rfl: 0   furosemide (LASIX) 20 MG tablet, Take 2 tablets (40 mg total) by mouth daily. If swelling is less, can take only 1 pill., Disp: 180 tablet, Rfl: 3   gabapentin (NEURONTIN) 300 MG capsule, Take 3 capsules (900 mg total) by mouth 3 (three) times daily., Disp: 270 capsule, Rfl: 5   LORazepam (ATIVAN) 0.5 MG tablet, Take 1 tablet (0.5 mg total) by mouth every other day as needed (for severe anxiety)., Disp: 15 tablet, Rfl: 0   Melatonin 3 MG CAPS, Take 10 mg by mouth at bedtime as needed (sleep). , Disp: , Rfl:    meloxicam (MOBIC) 7.5 MG tablet, Take 1 tablet (7.5 mg total) by mouth daily after breakfast., Disp: 30 tablet, Rfl: 0   metoprolol succinate (TOPROL-XL) 25 MG 24 hr tablet, TAKE 1/2 TABLET(12.5 MG) BY MOUTH DAILY, Disp: 45 tablet, Rfl: 0   montelukast (SINGULAIR) 10 MG tablet, TAKE 1 TABLET(10 MG) BY MOUTH AT BEDTIME, Disp: 90 tablet, Rfl: 1   multivitamin-iron-minerals-folic acid (CENTRUM) chewable tablet, Chew 1 tablet by mouth daily., Disp: , Rfl:    ondansetron (ZOFRAN-ODT) 4 MG disintegrating tablet, 1 tablet 3 times daily as needed nausea, Disp: 20 tablet, Rfl: 0   potassium chloride (KLOR-CON) 10 MEQ tablet, Take 1 tablet (10 mEq total) by mouth daily., Disp: 90 tablet, Rfl: 3   QUEtiapine (SEROQUEL) 200 MG tablet, Take 1 tablet (200 mg total) by mouth at bedtime., Disp: 30 tablet, Rfl: 0   TRELEGY ELLIPTA 200-62.5-25 MCG/ACT AEPB, INHALE 1 PUFF INTO THE LUNGS DAILY, Disp: 60 each, Rfl: 5   valACYclovir (VALTREX) 1000 MG tablet, Take 2 tablets (2,000 mg total) by mouth every 12 (twelve) hours. X 1 day, Disp: 12 tablet, Rfl: 0  Allergies  Allergen Reactions   Bextra  [Valdecoxib]    Compazine [Prochlorperazine  Edisylate]     Stroke-like symptoms   Lithium Carbonate     Leg weakness   Lyrica [Pregabalin]     I personally reviewed {Reviewed:14835} with the patient/caregiver today.   ROS    Objective  There were no vitals filed for this visit.  There is no height or weight on file to calculate BMI.  Physical Exam   Recent Results (from the past 2160 hour(s))  Urinalysis, Routine w reflex microscopic Urine, Clean Catch     Status: Abnormal   Collection Time: 07/06/21 11:51 AM  Result Value Ref Range   Color, Urine YELLOW YELLOW   APPearance CLEAR CLEAR   Specific Gravity, Urine 1.010 1.005 - 1.030   pH 6.5 5.0 - 8.0  Glucose, UA NEGATIVE NEGATIVE mg/dL   Hgb urine dipstick TRACE (A) NEGATIVE   Bilirubin Urine NEGATIVE NEGATIVE   Ketones, ur NEGATIVE NEGATIVE mg/dL   Protein, ur NEGATIVE NEGATIVE mg/dL   Nitrite NEGATIVE NEGATIVE   Leukocytes,Ua MODERATE (A) NEGATIVE    Comment: Performed at [facility], [address], [place], [place] [address]  Urinalysis, Microscopic (reflex)     Status: Abnormal   Collection Time: 07/06/21 11:51 AM  Result Value Ref Range   RBC / HPF 6-10 0 - 5 RBC/hpf   WBC, UA 0-5 0 - 5 WBC/hpf   Bacteria, UA MANY (A) NONE SEEN   Squamous Epithelial / LPF 0-5 0 - 5    Comment: Performed at [facility] [place], [address], [place], [place] [address]  Urine Culture     Status: Abnormal   Collection Time: 07/06/21 11:51 AM   Specimen: Urine, Clean Catch  Result Value Ref Range   Specimen Description      URINE, CLEAN CATCH Performed at [facility], [address], [place], [place] [address]    Special Requests      NONE Performed at [facility], [address] [place], [place] [address]    Culture MULTIPLE SPECIES PRESENT, SUGGEST RECOLLECTION (A)    Report Status 07/07/2021 FINAL   TSH     Status: None   Collection Time: 07/12/21 11:13 AM  Result Value Ref Range   TSH 2.626 0.350 - 4.500  uIU/mL    Comment: Performed by a 3rd Generation assay with a functional sensitivity of <=0.01 uIU/mL. Performed at [facility], [place] [place], [place] [address]   Urinalysis, Routine w reflex microscopic Urine, Clean Catch     Status: Abnormal   Collection Time: 09/17/21  2:42 PM  Result Value Ref Range   Color, Urine YELLOW YELLOW   APPearance CLEAR CLEAR   Specific Gravity, Urine 1.010 1.005 - 1.030   pH 5.5 5.0 - 8.0   Glucose, UA NEGATIVE NEGATIVE mg/dL   Hgb urine dipstick TRACE (A) NEGATIVE   Bilirubin Urine NEGATIVE NEGATIVE   Ketones, ur NEGATIVE NEGATIVE mg/dL   Protein, ur NEGATIVE NEGATIVE mg/dL   Nitrite NEGATIVE NEGATIVE   Leukocytes,Ua LARGE (A) NEGATIVE    Comment: Performed at [facility], [address] [place], [place] [address]  Urinalysis, Microscopic (reflex)     Status: Abnormal   Collection Time: 09/17/21  2:42 PM  Result Value Ref Range   RBC / HPF 6-10 0 - 5 RBC/hpf   WBC, UA 21-50 0 - 5 WBC/hpf   Bacteria, UA MANY (A) NONE SEEN   Squamous Epithelial / LPF 6-10 0 - 5    Comment: Performed at [facility] [place], [address] [place], [place] [address]  Urine Culture     Status: Abnormal   Collection Time: 09/17/21  4:04 PM   Specimen: Urine, Clean Catch  Result Value Ref Range   Specimen Description      URINE, CLEAN CATCH Performed at [facility], [address], [place], [place] [address]    Special Requests      NONE Performed at [facility] [place] [place], [place] [address]    Culture (A)     <10,000 COLONIES/mL INSIGNIFICANT GROWTH Performed at [facility] [address], [place],  [address]    Report Status 09/18/2021 FINAL  PHQ2/9:    08/30/2021   11:02 AM 08/14/2021    3:02 PM 07/12/2021   10:49 AM 02/21/2021    2:33 PM 11/14/2020    2:35 PM  Depression screen PHQ 2/9  Decreased Interest 0   1 1  Down, Depressed, Hopeless 0   1 1   PHQ - 2 Score 0   2 2  Altered sleeping    0 0  Tired, decreased energy    1 2  Change in appetite    1 1  Feeling bad or failure about yourself     2 2  Trouble concentrating    0 2  Moving slowly or fidgety/restless    0 1  Suicidal thoughts    0 0  PHQ-9 Score    6 10  Difficult doing work/chores    Somewhat difficult Somewhat difficult     Information is confidential and restricted. Go to Review Flowsheets to unlock data.      Fall Risk:    08/30/2021   11:01 AM 05/15/2021    1:48 PM 02/21/2021    2:33 PM 11/15/2020   11:22 AM 11/14/2020    2:35 PM  Fall Risk   Falls in the past year? '1 1 1 '$ 0 1  Number falls in past yr: '1 1 1  1  '$ Injury with Fall? 1 0 0  0  Comment hurt ribs and back but dd not seek treatment      Risk for fall due to :  Impaired balance/gait;History of fall(s) Impaired balance/gait;History of fall(s)  History of fall(s)  Follow up  Falls evaluation completed Falls evaluation completed  Falls evaluation completed      Functional Status Survey:      Assessment & Plan

## 2021-09-21 NOTE — Patient Instructions (Signed)
You can take the Zofran as needed up to every 8 hours to help with your nausea  If you continue to have trouble urinating, start running a fever, notice blood in your urine, continue to have nausea, vomiting and diarrhea I recommend going to the ED to be evaluated

## 2021-09-21 NOTE — Assessment & Plan Note (Signed)
Acute, new concern Reports she is having difficulty urinating and intermittent discomfort, denies hematuria to her knowledge Reports associated nausea and dizziness UA was concerning for UTI- given her symptoms will start Cipro to provide coverage for potential pyelo  Will send sample for culture - results to dictate further management Recommend she stay well hydrated and use Zofran for nausea. Follow up as needed for persistent or progressing symptoms

## 2021-09-22 LAB — CBC WITH DIFFERENTIAL/PLATELET
Absolute Monocytes: 742 cells/uL (ref 200–950)
Basophils Absolute: 35 cells/uL (ref 0–200)
Basophils Relative: 0.3 %
Eosinophils Absolute: 70 cells/uL (ref 15–500)
Eosinophils Relative: 0.6 %
HCT: 39.4 % (ref 35.0–45.0)
Hemoglobin: 13 g/dL (ref 11.7–15.5)
Lymphs Abs: 2274 cells/uL (ref 850–3900)
MCH: 31.1 pg (ref 27.0–33.0)
MCHC: 33 g/dL (ref 32.0–36.0)
MCV: 94.3 fL (ref 80.0–100.0)
MPV: 10.4 fL (ref 7.5–12.5)
Monocytes Relative: 6.4 %
Neutro Abs: 8480 cells/uL — ABNORMAL HIGH (ref 1500–7800)
Neutrophils Relative %: 73.1 %
Platelets: 270 10*3/uL (ref 140–400)
RBC: 4.18 10*6/uL (ref 3.80–5.10)
RDW: 13.1 % (ref 11.0–15.0)
Total Lymphocyte: 19.6 %
WBC: 11.6 10*3/uL — ABNORMAL HIGH (ref 3.8–10.8)

## 2021-09-22 LAB — COMPLETE METABOLIC PANEL WITH GFR
AG Ratio: 1.2 (calc) (ref 1.0–2.5)
ALT: 87 U/L — ABNORMAL HIGH (ref 6–29)
AST: 93 U/L — ABNORMAL HIGH (ref 10–35)
Albumin: 3.8 g/dL (ref 3.6–5.1)
Alkaline phosphatase (APISO): 109 U/L (ref 37–153)
BUN: 10 mg/dL (ref 7–25)
CO2: 24 mmol/L (ref 20–32)
Calcium: 9.4 mg/dL (ref 8.6–10.4)
Chloride: 103 mmol/L (ref 98–110)
Creat: 0.99 mg/dL (ref 0.60–1.00)
Globulin: 3.1 g/dL (calc) (ref 1.9–3.7)
Glucose, Bld: 96 mg/dL (ref 65–99)
Potassium: 3.6 mmol/L (ref 3.5–5.3)
Sodium: 142 mmol/L (ref 135–146)
Total Bilirubin: 0.5 mg/dL (ref 0.2–1.2)
Total Protein: 6.9 g/dL (ref 6.1–8.1)
eGFR: 59 mL/min/{1.73_m2} — ABNORMAL LOW (ref >=60)

## 2021-09-22 LAB — URINE CULTURE
MICRO NUMBER:: 13832952
SPECIMEN QUALITY:: ADEQUATE

## 2021-09-25 ENCOUNTER — Ambulatory Visit: Payer: Medicare Other

## 2021-09-26 DIAGNOSIS — J441 Chronic obstructive pulmonary disease with (acute) exacerbation: Secondary | ICD-10-CM | POA: Diagnosis not present

## 2021-09-28 ENCOUNTER — Other Ambulatory Visit: Payer: Self-pay | Admitting: Student in an Organized Health Care Education/Training Program

## 2021-09-28 ENCOUNTER — Ambulatory Visit: Payer: Medicare Other

## 2021-10-03 ENCOUNTER — Ambulatory Visit: Payer: Medicare Other | Admitting: Student in an Organized Health Care Education/Training Program

## 2021-10-05 ENCOUNTER — Ambulatory Visit (INDEPENDENT_AMBULATORY_CARE_PROVIDER_SITE_OTHER): Payer: Medicare Other

## 2021-10-05 VITALS — Wt 138.0 lb

## 2021-10-05 DIAGNOSIS — Z0000 Encounter for general adult medical examination without abnormal findings: Secondary | ICD-10-CM

## 2021-10-05 NOTE — Progress Notes (Signed)
Virtual Visit via Telephone Note  I connected with  Beth [name] on 10/05/21 at 10:45 AM EDT by telephone and verified that I am speaking with the correct person using two identifiers.  Location: Patient: home Provider: [facility] Persons participating in the virtual visit: Beth [name]   I discussed the limitations, risks, security and privacy concerns of performing an evaluation and management service by telephone and the availability of in person appointments. The patient expressed understanding and agreed to proceed.  Interactive audio and video telecommunications were attempted between this nurse and patient, however failed, due to patient having technical difficulties OR patient did not have access to video capability.  We continued and completed visit with audio only.  Some vital signs may be absent or patient reported.   Dionisio David, LPN  Subjective:   Beth [name] is a 76 y.o. female who presents for Medicare Annual (Subsequent) preventive examination.  Review of Systems     Cardiac Risk Factors include: advanced age (>57mn, >>62women);dyslipidemia;smoking/ tobacco exposure     Objective:    There were no vitals filed for this visit. There is no height or weight on file to calculate BMI.     10/05/2021   10:51 AM 08/30/2021   11:02 AM 07/06/2021   11:50 AM 09/25/2020    9:08 AM 09/19/2020    1:36 PM 08/21/2020    1:18 PM 03/27/2020   11:07 AM  Advanced Directives  Does Patient Have a Medical Advance Directive? No Yes No No No No No  Type of ASocial research officer, governmentLiving will       Would patient like information on creating a medical advance directive? No - Patient declined     Yes (MAU/Ambulatory/Procedural Areas - Information given)     Current Medications (verified) Outpatient Encounter Medications as of 10/05/2021  Medication Sig   acetaminophen (TYLENOL) 500 MG tablet Take 500 mg by mouth 2 (two) times daily as needed.    albuterol (VENTOLIN HFA) 108 (90 Base) MCG/ACT inhaler Inhale into the lungs every 6 (six) hours as needed for wheezing or shortness of breath.   aspirin 81 MG chewable tablet Chew 81 mg by mouth daily.   atorvastatin (LIPITOR) 40 MG tablet TAKE 1 TABLET BY MOUTH EVERY DAY   FLUoxetine (PROZAC) 20 MG capsule TAKE 3 CAPSULES(60 MG) BY MOUTH DAILY   furosemide (LASIX) 20 MG tablet Take 2 tablets (40 mg total) by mouth daily. If swelling is less, can take only 1 pill.   gabapentin (NEURONTIN) 300 MG capsule Take 3 capsules (900 mg total) by mouth 3 (three) times daily.   LORazepam (ATIVAN) 0.5 MG tablet Take 1 tablet (0.5 mg total) by mouth every other day as needed (for severe anxiety).   Melatonin 3 MG CAPS Take 10 mg by mouth at bedtime as needed (sleep).    metoprolol succinate (TOPROL-XL) 25 MG 24 hr tablet TAKE 1/2 TABLET(12.5 MG) BY MOUTH DAILY   multivitamin-iron-minerals-folic acid (CENTRUM) chewable tablet Chew 1 tablet by mouth daily.   potassium chloride (KLOR-CON) 10 MEQ tablet Take 1 tablet (10 mEq total) by mouth daily.   QUEtiapine (SEROQUEL) 200 MG tablet Take 1 tablet (200 mg total) by mouth at bedtime.   TRELEGY ELLIPTA 200-62.5-25 MCG/ACT AEPB INHALE 1 PUFF INTO THE LUNGS DAILY   valACYclovir (VALTREX) 1000 MG tablet Take 2 tablets (2,000 mg total) by mouth every 12 (twelve) hours. X 1 day   cephALEXin (KEFLEX) 500 MG capsule Take  1 capsule (500 mg total) by mouth 2 (two) times daily. (Patient not taking: Reported on 10/05/2021)   montelukast (SINGULAIR) 10 MG tablet TAKE 1 TABLET(10 MG) BY MOUTH AT BEDTIME (Patient not taking: Reported on 10/05/2021)   ondansetron (ZOFRAN-ODT) 4 MG disintegrating tablet 1 tablet 3 times daily as needed nausea (Patient not taking: Reported on 10/05/2021)   No facility-administered encounter medications on file as of 10/05/2021.    Allergies (verified) Bextra  [valdecoxib], Compazine [prochlorperazine edisylate], Lithium carbonate, and Lyrica  [pregabalin]   History: Past Medical History:  Diagnosis Date   Allergy    Anxiety    Asthma    Chronic pain syndrome    discharged from pain clinic, hx of narcotics seeking behavior   COPD (chronic obstructive pulmonary disease) (HCC)    CVA (cerebral infarction)    Depression    Fibromyalgia    Headache    Hyperlipidemia    Hypertension    IBS (irritable bowel syndrome)    Stroke ([place])    Vitamin D deficiency    Past Surgical History:  Procedure Laterality Date   ABDOMINAL HYSTERECTOMY     APPENDECTOMY     BREAST EXCISIONAL BIOPSY  2011   Pt states lump removed, ? side, no scar seen   BREAST SURGERY  2011   biopsy   CERVICAL DISCECTOMY     CHOLECYSTECTOMY     SINUSOTOMY     Family History  Problem Relation Age of Onset   Anxiety disorder Mother    Depression Mother    Breast cancer Mother 75   Cancer Father    Gallbladder disease Father    Alcohol abuse Father    Depression Father    Bipolar disorder Son    Social History   Socioeconomic History   Marital status: Legally Separated    Spouse name: Not on file   Number of children: 2   Years of education: Not on file   Highest education level: Not on file  Occupational History   Occupation: retired  Tobacco Use   Smoking status: Former    Packs/day: 0.25    Years: 57.00    Total pack years: 14.25    Types: Cigarettes    Quit date: 10/24/2020    Years since quitting: 0.9   Smokeless tobacco: Never   Tobacco comments:    6-8cig daily--08/28/2020  Vaping Use   Vaping Use: Never used  Substance and Sexual Activity   Alcohol use: No    Alcohol/week: 0.0 standard drinks of alcohol   Drug use: No   Sexual activity: Not Currently  Other Topics Concern   Not on file  Social History Narrative   Not on file   Social Determinants of Health   Financial Resource Strain: Low Risk  (10/05/2021)   Overall Financial Resource Strain (CARDIA)    Difficulty of Paying Living Expenses: Not hard at all  Food  Insecurity: No Food Insecurity (10/05/2021)   Hunger Vital Sign    Worried About Running Out of Food in the Last Year: Never true    [employer] in the Last Year: Never true  Transportation Needs: No Transportation Needs (10/05/2021)   PRAPARE - Hydrologist (Medical): No    Lack of Transportation (Non-Medical): No  Physical Activity: Insufficiently Active (10/05/2021)   Exercise Vital Sign    Days of Exercise per Week: 7 days    Minutes of Exercise per Session: 20 min  Stress: No Stress  Concern Present (10/05/2021)   [place]    Feeling of Stress : Only a little  Social Connections: Moderately Isolated (10/05/2021)   Social Connection and Isolation Panel [NHANES]    Frequency of Communication with Friends and Family: Twice a week    Frequency of Social Gatherings with Friends and Family: Once a week    Attends Religious Services: Never    Marine scientist or Organizations: No    Attends Music therapist: Never    Marital Status: Married    Tobacco Counseling Counseling given: Not Answered Tobacco comments: 6-8cig daily--08/28/2020   Clinical Intake:  Pre-visit preparation completed: Yes  Pain : No/denies pain     Nutritional Risks: None Diabetes: No  How often do you need to have someone help you when you read instructions, pamphlets, or other written materials from your doctor or pharmacy?: 1 - Never  Diabetic?no  Interpreter Needed?: No  Information entered by :: Kirke Shaggy, LPN   Activities of Daily Living    10/05/2021   10:52 AM  In your present state of health, do you have any difficulty performing the following activities:  Hearing? 0  Vision? 0  Difficulty concentrating or making decisions? 0  Walking or climbing stairs? 1  Dressing or bathing? 0  Doing errands, shopping? 0  Preparing Food and eating ? N  Using the Toilet? N  In the past  six months, have you accidently leaked urine? N  Do you have problems with loss of bowel control? N  Managing your Medications? N  Managing your Finances? N  Housekeeping or managing your Housekeeping? N    Patient Care Team: [name], DO as PCP - General (Family Medicine) Kate Sable, MD as PCP - Cardiology (Cardiology) [name], MD as Consulting Physician (Pain Medicine) Randel Pigg, MD as Referring Physician (Psychiatry) Curley Spice, Virl Diamond, RPH-CPP (Pharmacist) Vanita Ingles, RN as Case Manager (General Practice) Rebekah Chesterfield, LCSW as Social Worker (Licensed Clinical Social Worker)  Indicate any recent [place] you may have received from other than [facility] providers in the past year (date may be approximate).     Assessment:   This is a routine wellness examination for [facility].  Hearing/Vision screen Hearing Screening - Comments:: No aids Vision Screening - Comments:: No glasses  Dietary issues and exercise activities discussed: Current Exercise Habits: Home exercise routine, Type of exercise: walking, Time (Minutes): 20, Frequency (Times/Week): 7, Weekly Exercise (Minutes/Week): 140, Intensity: Mild   Goals Addressed             This Visit's Progress    DIET - EAT MORE FRUITS AND VEGETABLES         Depression Screen    10/05/2021   10:49 AM 08/30/2021   11:02 AM 08/14/2021    3:02 PM 07/12/2021   10:49 AM 02/21/2021    2:33 PM 11/14/2020    2:35 PM 09/25/2020    9:08 AM  PHQ 2/9 Scores  PHQ - 2 Score 1 0   2 2 0  PHQ- 9 Score '4    6 10      '$ Information is confidential and restricted. Go to Review Flowsheets to unlock data.    Fall Risk    10/05/2021   10:52 AM 08/30/2021   11:01 AM 05/15/2021    1:48 PM 02/21/2021    2:33 PM 11/15/2020   11:22 AM  Fall Risk   Falls in  the past year? '1 1 1 1 '$ 0  Number falls in past yr: '1 1 1 1   '$ Injury with Fall? 0 1 0 0   Comment  hurt ribs and back but dd not seek treatment      Risk for fall due to : History of fall(s)  Impaired balance/gait;History of fall(s) Impaired balance/gait;History of fall(s)   Follow up Falls prevention discussed;Falls evaluation completed  Falls evaluation completed Falls evaluation completed     FALL RISK PREVENTION PERTAINING TO THE HOME:  Any stairs in or around the home? No  If so, are there any without handrails? No  Home free of loose throw rugs in walkways, pet beds, electrical cords, etc? Yes  Adequate lighting in your home to reduce risk of falls? Yes   ASSISTIVE DEVICES UTILIZED TO PREVENT FALLS:  Life alert? No  Use of a cane, walker or w/c? Yes  Grab bars in the bathroom? Yes  Shower chair or bench in shower? Yes  Elevated toilet seat or a handicapped toilet? No   Cognitive Function:        10/05/2021   10:54 AM 09/19/2020    1:39 PM 07/27/2019   11:52 AM  6CIT Screen  What Year? 0 points 0 points 0 points  What month? 0 points 0 points 0 points  What time? 0 points 0 points 0 points  Count back from 20 0 points 0 points 0 points  Months in reverse 0 points 0 points 2 points  Repeat phrase 2 points 4 points 0 points  Total Score 2 points 4 points 2 points    Immunizations Immunization History  Administered Date(s) Administered   Fluad Quad(high Dose 65+) 11/14/2020   Influenza Inj Mdck Quad Pf 11/05/2017   Influenza Split 11/13/2006, 11/02/2008   Influenza, High Dose Seasonal PF 09/27/2014, 09/27/2014, 09/29/2015, 09/29/2015, 12/30/2016, 10/15/2018, 11/05/2019   Influenza, Seasonal, Injecte, Preservative Fre 11/12/2010   Influenza,inj,Quad PF,6+ Mos 10/08/2012   Influenza-Unspecified 04/08/2014   PFIZER(Purple Top)SARS-COV-2 Vaccination 03/14/2019, 04/09/2019, 11/05/2019   PPD Test 05/16/2016   Pneumococcal Conjugate-13 09/27/2014   Pneumococcal Polysaccharide-23 06/12/2010   Tdap 03/19/2007, 09/29/2015   Zoster Recombinat (Shingrix) 04/10/2018, 05/15/2021   Zoster, Live 04/16/2012    TDAP status:  Up to date  Flu Vaccine status: Up to date  Pneumococcal vaccine status: Up to date  Covid-19 vaccine status: Completed vaccines  Qualifies for Shingles Vaccine? Yes   Zostavax completed Yes   Shingrix Completed?: Yes  Screening Tests Health Maintenance  Topic Date Due   COVID-19 Vaccine (4 - Pfizer risk series) 12/31/2019   INFLUENZA VACCINE  08/28/2021   TETANUS/TDAP  09/28/2025   Pneumonia Vaccine 65+ Years old  Completed   DEXA SCAN  Completed   Hepatitis C Screening  Completed   Zoster Vaccines- Shingrix  Completed   HPV VACCINES  Aged Out   COLONOSCOPY (Pts 45-68yr Insurance coverage will need to be confirmed)  [place]  Topic Date Due   COVID-19 Vaccine (4 - Pfizer risk series) 12/31/2019   INFLUENZA VACCINE  08/28/2021    Colorectal cancer screening: No longer required.   Mammogram status: No longer required due to age.  Bone Density status: Completed 05/06/12. Results reflect: Bone density results: OSTEOPENIA. Repeat every 5 years.- declined referral  Lung Cancer Screening: (Low Dose CT Chest recommended if Age 78-80years, 30 pack-year currently smoking OR have quit w/in 15years.) does not qualify.    Additional Screening:  Hepatitis C Screening: does qualify; Completed 04/27/12  Vision Screening: Recommended annual ophthalmology exams for early detection of glaucoma and other disorders of the eye. Is the patient up to date with their annual eye exam?  No  Who is the provider or what is the name of the office in which the patient attends annual eye exams? No one If pt is not established with a provider, would they like to be referred to a provider to establish care? No .   Dental Screening: Recommended annual dental exams for proper oral hygiene  Community Resource Referral / Chronic Care Management: CRR required this visit?  No   CCM required this visit?  No      Plan:     I have personally  reviewed and noted the following in the patient's chart:   Medical and social history Use of alcohol, tobacco or illicit drugs  Current medications and supplements including opioid prescriptions. Patient is not currently taking opioid prescriptions. Functional ability and status Nutritional status Physical activity Advanced directives List of other physicians Hospitalizations, surgeries, and ER visits in previous 12 months Vitals Screenings to include cognitive, depression, and falls Referrals and appointments  In addition, I have reviewed and discussed with patient certain preventive protocols, quality metrics, and best practice recommendations. A written personalized care plan for preventive services as well as general preventive health recommendations were provided to patient.     Dionisio David, LPN   07/01/1495   Nurse Notes: none

## 2021-10-05 NOTE — Patient Instructions (Signed)
Beth [name] , Thank you for taking time to come for your Medicare Wellness Visit. I appreciate your ongoing commitment to your health goals. Please review the following plan we discussed and let me know if I can assist you in the future.   Screening recommendations/referrals: Colonoscopy: aged out Mammogram: aged out Bone Density: declined referral Recommended yearly ophthalmology/optometry visit for glaucoma screening and checkup Recommended yearly dental visit for hygiene and checkup  Vaccinations: Influenza vaccine: 11/14/20 Pneumococcal vaccine: 09/27/14 Tdap vaccine: 09/29/15 Shingles vaccine: Zostavax 04/16/12   Shingrix 04/10/18, 05/15/21   Covid-19:03/14/19, 04/09/19, 11/05/19  Advanced directives: no  Conditions/risks identified: none  Next appointment: Follow up in one year for your annual wellness visit 10/11/22 @ 11 am by phone   Preventive Care 65 Years and Older, Female Preventive care refers to lifestyle choices and visits with your health care provider that can promote health and wellness. What does preventive care include? A yearly physical exam. This is also called an annual well check. Dental exams once or twice a year. Routine eye exams. Ask your health care provider how often you should have your eyes checked. Personal lifestyle choices, including: Daily care of your teeth and gums. Regular physical activity. Eating a healthy diet. Avoiding tobacco and drug use. Limiting alcohol use. Practicing safe sex. Taking low-dose aspirin every day. Taking vitamin and mineral supplements as recommended by your health care provider. What happens during an annual well check? The services and screenings done by your health care provider during your annual well check will depend on your age, overall health, lifestyle risk factors, and family history of disease. Counseling  Your health care provider may ask you questions about your: Alcohol use. Tobacco use. Drug  use. Emotional well-being. Home and relationship well-being. Sexual activity. Eating habits. History of falls. Memory and ability to understand (cognition). Work and work Statistician. Reproductive health. Screening  You may have the following tests or measurements: Height, weight, and BMI. Blood pressure. Lipid and cholesterol levels. These may be checked every 5 years, or more frequently if you are over 66 years old. Skin check. Lung cancer screening. You may have this screening every year starting at age 46 if you have a 30-pack-year history of smoking and currently smoke or have quit within the past 15 years. Fecal occult blood test (FOBT) of the stool. You may have this test every year starting at age 42. Flexible sigmoidoscopy or colonoscopy. You may have a sigmoidoscopy every 5 years or a colonoscopy every 10 years starting at age 1. Hepatitis C blood test. Hepatitis B blood test. Sexually transmitted disease (STD) testing. Diabetes screening. This is done by checking your blood sugar (glucose) after you have not eaten for a while (fasting). You may have this done every 1-3 years. Bone density scan. This is done to screen for osteoporosis. You may have this done starting at age 96. Mammogram. This may be done every 1-2 years. Talk to your health care provider about how often you should have regular mammograms. Talk with your health care provider about your test results, treatment options, and if necessary, the need for more tests. Vaccines  Your health care provider may recommend certain vaccines, such as: Influenza vaccine. This is recommended every year. Tetanus, diphtheria, and acellular pertussis (Tdap, Td) vaccine. You may need a Td booster every 10 years. Zoster vaccine. You may need this after age 53. Pneumococcal 13-valent conjugate (PCV13) vaccine. One dose is recommended after age 59. Pneumococcal polysaccharide (PPSV23) vaccine. One dose is  recommended after age  8. Talk to your health care provider about which screenings and vaccines you need and how often you need them. This information is not intended to replace advice given to you by your health care provider. Make sure you discuss any questions you have with your health care provider. Document Released: 02/10/2015 Document Revised: 10/04/2015 Document Reviewed: 11/15/2014 Elsevier Interactive Patient Education  2017 [place] Prevention in the Home Falls can cause injuries. They can happen to people of all ages. There are many things you can do to make your home safe and to help prevent falls. What can I do on the outside of my home? Regularly fix the edges of walkways and driveways and fix any cracks. Remove anything that might make you trip as you walk through a door, such as a raised step or threshold. Trim any bushes or trees on the path to your home. Use bright outdoor lighting. Clear any walking paths of anything that might make someone trip, such as rocks or tools. Regularly check to see if handrails are loose or broken. Make sure that both sides of any steps have handrails. Any raised decks and porches should have guardrails on the edges. Have any leaves, snow, or ice cleared regularly. Use sand or salt on walking paths during winter. Clean up any spills in your garage right away. This includes oil or grease spills. What can I do in the bathroom? Use night lights. Install grab bars by the toilet and in the tub and shower. Do not use towel bars as grab bars. Use non-skid mats or decals in the tub or shower. If you need to sit down in the shower, use a plastic, non-slip stool. Keep the floor dry. Clean up any water that spills on the floor as soon as it happens. Remove soap buildup in the tub or shower regularly. Attach bath mats securely with double-sided non-slip rug tape. Do not have throw rugs and other things on the floor that can make you trip. What can I do in the  bedroom? Use night lights. Make sure that you have a light by your bed that is easy to reach. Do not use any sheets or blankets that are too big for your bed. They should not hang down onto the floor. Have a firm chair that has side arms. You can use this for support while you get dressed. Do not have throw rugs and other things on the floor that can make you trip. What can I do in the kitchen? Clean up any spills right away. Avoid walking on wet floors. Keep items that you use a lot in easy-to-reach places. If you need to reach something above you, use a strong step stool that has a grab bar. Keep electrical cords out of the way. Do not use floor polish or wax that makes floors slippery. If you must use wax, use non-skid floor wax. Do not have throw rugs and other things on the floor that can make you trip. What can I do with my stairs? Do not leave any items on the stairs. Make sure that there are handrails on both sides of the stairs and use them. Fix handrails that are broken or loose. Make sure that handrails are as long as the stairways. Check any carpeting to make sure that it is firmly attached to the stairs. Fix any carpet that is loose or worn. Avoid having throw rugs at the top or bottom of the stairs. If  you do have throw rugs, attach them to the floor with carpet tape. Make sure that you have a light switch at the top of the stairs and the bottom of the stairs. If you do not have them, ask someone to add them for you. What else can I do to help prevent falls? Wear shoes that: Do not have high heels. Have rubber bottoms. Are comfortable and fit you well. Are closed at the toe. Do not wear sandals. If you use a stepladder: Make sure that it is fully opened. Do not climb a closed stepladder. Make sure that both sides of the stepladder are locked into place. Ask someone to hold it for you, if possible. Clearly mark and make sure that you can see: Any grab bars or  handrails. First and last steps. Where the edge of each step is. Use tools that help you move around (mobility aids) if they are needed. These include: Canes. Walkers. Scooters. Crutches. Turn on the lights when you go into a dark area. Replace any light bulbs as soon as they burn out. Set up your furniture so you have a clear path. Avoid moving your furniture around. If any of your floors are uneven, fix them. If there are any pets around you, be aware of where they are. Review your medicines with your doctor. Some medicines can make you feel dizzy. This can increase your chance of falling. Ask your doctor what other things that you can do to help prevent falls. This information is not intended to replace advice given to you by your health care provider. Make sure you discuss any questions you have with your health care provider. Document Released: 11/10/2008 Document Revised: 06/22/2015 Document Reviewed: 02/18/2014 Elsevier Interactive Patient Education  2017 [employer].
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
## 2021-10-08 ENCOUNTER — Ambulatory Visit: Payer: Self-pay

## 2021-10-08 ENCOUNTER — Encounter: Payer: Self-pay | Admitting: Psychiatry

## 2021-10-08 ENCOUNTER — Telehealth (INDEPENDENT_AMBULATORY_CARE_PROVIDER_SITE_OTHER): Payer: Medicare Other | Admitting: Psychiatry

## 2021-10-08 DIAGNOSIS — F4310 Post-traumatic stress disorder, unspecified: Secondary | ICD-10-CM | POA: Diagnosis not present

## 2021-10-08 DIAGNOSIS — F330 Major depressive disorder, recurrent, mild: Secondary | ICD-10-CM | POA: Diagnosis not present

## 2021-10-08 DIAGNOSIS — F411 Generalized anxiety disorder: Secondary | ICD-10-CM | POA: Diagnosis not present

## 2021-10-08 MED ORDER — LORAZEPAM 0.5 MG PO TABS: 0.5000 mg | ORAL_TABLET | ORAL | 1 refills | Status: DC | PRN

## 2021-10-08 MED ORDER — QUETIAPINE FUMARATE 200 MG PO TABS: 200.0000 mg | ORAL_TABLET | Freq: Every day | ORAL | 1 refills | Status: DC

## 2021-10-08 NOTE — Patient Instructions (Signed)
Continue fluoxetine 60 mg daily Continue quetiapine 200 mg at night Continue lorazepam 0.5 mg every other day as needed for extreme anxiety  Next appointment- 11/2 at 2:30

## 2021-10-08 NOTE — Patient Outreach (Signed)
  Care Coordination   Follow Up Visit Note   10/08/2021 Name: Beth [name] MRN: [id number] DOB: [birth year]  Beth [name] is a 76 y.o. year old female who sees Beth Hauser, DO for primary care. I spoke with  Beth [name] by phone today.  What matters to the patients health and wellness today?  No new UTI, maintain mental health well being    Goals Addressed             This Visit's Progress    COMPLETED: RNCM: "I think I have a Urinary Track Infection"       Care Coordination Interventions:10-08-2021: Closing this goal. Met Evaluation of current treatment plan related to possible UTI and patient's adherence to plan as established by provider. The patient states that she is doing better and the UTI has resolved. She states that she is feeling better. Never had a fever. Has followed up with the pcp. Closing this goal.  Advised patient to go to the urgent care to be evaluated. The patient states that her son is coming to take her to the urgent care because she could not get in to see the pcp today.  Provided education to patient re: monitoring for fever, changes in temperature tolerance, increased urgency and burning on urination, chills, and other sx and sx of infection Reviewed medications with patient and discussed compliance with the patient and if she is given and antibiotic to make sure she takes all of the medication Provided patient with sx and sx of UTI educational materials related to possible UTI Discussed plans with patient for ongoing care management follow up and provided patient with direct contact information for care management team Advised patient to discuss changes in her condition and changes in her urinary health with provider Screening for signs and symptoms of depression related to chronic disease state  Assessed social determinant of health barriers        RNCM: Effective management of mental health       Care Coordination  Interventions: Evaluation of current treatment plan related to anxiety and depression and patient's adherence to plan as established by provider Advised patient to call the office for changes in her mood, anxiety, depression, or mental health and well being Provided education to patient re: working with mental health team and care coordination team to meet the needs of her mental health and well being Reviewed medications with patient and discussed compliance Reviewed scheduled/upcoming provider appointments including telephone visit with mental health provider today. Was to be an onsite visit but the provider was sick and it is being changed to a telephone visit Discussed plans with patient for ongoing care management follow up and provided patient with direct contact information for care management team Advised patient to discuss discuss new concerns about her mental health with provider Screening for signs and symptoms of depression related to chronic disease state  Assessed social determinant of health barriers  Active listening / Reflection utilized  Emotional Support Provided         SDOH assessments and interventions completed:  No     Care Coordination Interventions Activated:  Yes  Care Coordination Interventions:  Yes, provided   Follow up plan: Follow up call scheduled for 01-07-2022 at 1 pm    Encounter Outcome:  Pt. Visit Completed   Beth Larsson RN, MSN, Beth [name] Mobile: [phone]

## 2021-10-08 NOTE — Patient Instructions (Signed)
Visit Information  Thank you for taking time to visit with me today. Please don't hesitate to contact me if I can be of assistance to you.   Following are the goals we discussed today:   Goals Addressed             This Visit's Progress    COMPLETED: RNCM: "I think I have a Urinary Track Infection"       Care Coordination Interventions:10-08-2021: Closing this goal. Met Evaluation of current treatment plan related to possible UTI and patient's adherence to plan as established by provider. The patient states that she is doing better and the UTI has resolved. She states that she is feeling better. Never had a fever. Has followed up with the pcp. Closing this goal.  Advised patient to go to the urgent care to be evaluated. The patient states that her son is coming to take her to the urgent care because she could not get in to see the pcp today.  Provided education to patient re: monitoring for fever, changes in temperature tolerance, increased urgency and burning on urination, chills, and other sx and sx of infection Reviewed medications with patient and discussed compliance with the patient and if she is given and antibiotic to make sure she takes all of the medication Provided patient with sx and sx of UTI educational materials related to possible UTI Discussed plans with patient for ongoing care management follow up and provided patient with direct contact information for care management team Advised patient to discuss changes in her condition and changes in her urinary health with provider Screening for signs and symptoms of depression related to chronic disease state  Assessed social determinant of health barriers        RNCM: Effective management of mental health       Care Coordination Interventions: Evaluation of current treatment plan related to anxiety and depression and patient's adherence to plan as established by provider Advised patient to call the office for changes in her  mood, anxiety, depression, or mental health and well being Provided education to patient re: working with mental health team and care coordination team to meet the needs of her mental health and well being Reviewed medications with patient and discussed compliance Reviewed scheduled/upcoming provider appointments including telephone visit with mental health provider today. Was to be an onsite visit but the provider was sick and it is being changed to a telephone visit Discussed plans with patient for ongoing care management follow up and provided patient with direct contact information for care management team Advised patient to discuss discuss new concerns about her mental health with provider Screening for signs and symptoms of depression related to chronic disease state  Assessed social determinant of health barriers  Active listening / Reflection utilized  Emotional Support Provided         Our next appointment is by telephone on 01-07-2022 at 1 pm  Please call the care guide team at [phone] if you need to cancel or reschedule your appointment.   If you are experiencing a Mental Health or [place] or need someone to talk to, please call the Suicide and Crisis Lifeline: 988 call the [place] National Suicide Prevention Lifeline: 435-828-8113 or TTY: 787-695-5936 TTY (941)803-8051) to talk to a trained counselor call 1-800-273-TALK (toll free, 24 hour hotline)  The patient verbalized understanding of instructions, educational materials, and care plan provided today and DECLINED offer to receive copy of patient instructions, educational materials, and care plan.  Telephone follow up appointment with care management team member scheduled for: 01-07-2022 at 1 pm

## 2021-10-17 ENCOUNTER — Other Ambulatory Visit: Payer: Self-pay | Admitting: Psychiatry

## 2021-10-18 NOTE — Telephone Encounter (Signed)
LORazepam (ATIVAN) 0.5 MG tablet 15 tablet 1 10/18/2021 12/17/2021   Sig - Route: Take 1 tablet (0.5 mg total) by mouth every other day as needed (for severe anxiety). - Oral   Sent to pharmacy as: LORazepam (ATIVAN) 0.5 MG tablet   Notes to Pharmacy: Fill when due   E-Prescribing Status: Receipt confirmed by pharmacy (10/08/2021  2:59 PM EDT)     Patient does have a prescription waiting for her for today with Dr. Modesta Messing.

## 2021-10-21 ENCOUNTER — Other Ambulatory Visit: Payer: Self-pay | Admitting: Pulmonary Disease

## 2021-10-23 NOTE — Telephone Encounter (Signed)
Decline- patient should have enough refill to last until the next visit.

## 2021-10-27 DIAGNOSIS — J441 Chronic obstructive pulmonary disease with (acute) exacerbation: Secondary | ICD-10-CM | POA: Diagnosis not present

## 2021-10-30 MED ORDER — GABAPENTIN 300 MG PO CAPS: 300.0000 mg | ORAL_CAPSULE | Freq: Three times a day (TID) | ORAL | 5 refills | Status: DC

## 2021-10-31 ENCOUNTER — Ambulatory Visit: Payer: Medicare Other | Admitting: Pharmacist

## 2021-10-31 ENCOUNTER — Other Ambulatory Visit: Payer: Self-pay | Admitting: Family Medicine

## 2021-10-31 DIAGNOSIS — F332 Major depressive disorder, recurrent severe without psychotic features: Secondary | ICD-10-CM

## 2021-10-31 DIAGNOSIS — I10 Essential (primary) hypertension: Secondary | ICD-10-CM

## 2021-10-31 DIAGNOSIS — J449 Chronic obstructive pulmonary disease, unspecified: Secondary | ICD-10-CM

## 2021-10-31 DIAGNOSIS — I5032 Chronic diastolic (congestive) heart failure: Secondary | ICD-10-CM

## 2021-10-31 DIAGNOSIS — F411 Generalized anxiety disorder: Secondary | ICD-10-CM

## 2021-10-31 DIAGNOSIS — I272 Pulmonary hypertension, unspecified: Secondary | ICD-10-CM

## 2021-10-31 NOTE — Chronic Care Management (AMB) (Signed)
Chief Complaint  Patient presents with   Care Coordination    Medication Management    Beth [name] is a 76 y.o. year old female who presented for a telephone visit.   They were referred to the pharmacist by their PCP for assistance in managing hypertension, COPD, HFpEF and HLD.  Subjective:  Today patient reports symptoms of urinary tract infection resolved since completed course of ciprofloxacin prescribed by PA Erin Mecum on 09/21/2021.  Reports lost weight when sick with urinary tract infection (~10 lbs), but reports weight has remained stable since infection treated (recently 135 lbs at home).  Comprehensive medication review performed; medication list updated in electronic medical record Discuss using caution for sedation/dizziness when using lorazepam as needed as directed by psychiatrist  Care Team: Primary Care Provider: [name], DO Cardiologist: Kate Sable, MD Pulmonology: [place] Psychiatry: Norman Clay, MD; Next Scheduled Visit: 11/29/2021  Medication Access/Adherence  Current Pharmacy:  [id number] - [address], [address] [place] [address] Phone: [phone] Fax: [phone]   Patient reports affordability concerns with their medications: No  Patient reports access/transportation concerns to their pharmacy: No  Patient reports adherence concerns with their medications:  No    Coordination of Care: Identify patient due for follow up appointments with PCP, Pulmonology and Cardiology Provide patient with phone numbers and patient states that she will call to schedule these appointments Patient reports that she needs an appointment for eye exam for new glasses. Advise patient to call to schedule this appointment today  HFpEF/Hypertension: Patient followed by [facility] [place]  Current medications:  Furosemide 20 mg - 2  tablets (40 mg) daily Potassium chloride 10 mEq daily Metoprolol ER 25 mg - 1/2 tablet (12.5 mg ) daily  Patient has a automated, upper arm home BP cuff Denies monitoring home blood pressure in past week  COPD: Patient followed by [facility] in [place]  Current medications: Trelegy 1 puff daily , rinse after use Patient confirms rinsing after use.  Albuterol inhaler as needed Overnight oxygen as directed by Pulmonology   Health Maintenance  Health Maintenance Due  Topic Date Due   COVID-19 Vaccine (4 - Pfizer risk series) 12/31/2019   Diabetic kidney evaluation - Urine ACR  08/02/2021   INFLUENZA VACCINE  08/28/2021     Objective:  Lab Results  Component Value Date   CREATININE 0.99 09/21/2021   BUN 10 09/21/2021   NA 142 09/21/2021   K 3.6 09/21/2021   CL 103 09/21/2021   CO2 24 09/21/2021    Lab Results  Component Value Date   CHOL 135 04/10/2018   HDL 59 04/10/2018   LDLCALC 64 04/10/2018   TRIG 50 04/10/2018   CHOLHDL 2.3 04/10/2018    Medications Reviewed Today     Reviewed by Rennis Petty, RPH-CPP (Pharmacist) on 10/31/21 at 71  Med List Status: <None>   Medication Order Taking? Sig Documenting Provider Last Dose Status Informant  acetaminophen (TYLENOL) 500 MG tablet [id number] Yes Take 500 mg by mouth 2 (two) times daily as needed. [provider] Taking Active   albuterol (VENTOLIN HFA) 108 (90 Base) MCG/ACT inhaler [id number] Yes Inhale into the lungs every 6 (six) hours as needed for wheezing or shortness of breath. [provider] Taking Active   aspirin 81 MG chewable tablet [id number] Yes Chew 81 mg by mouth daily. [provider] Taking Active Pharmacy Records           Med Note Dell Ponto Dec 18, 2016  4:14 PM)    atorvastatin (LIPITOR) 40 MG tablet [id number] Yes TAKE 1 TABLET BY MOUTH EVERY DAY [name], DO Taking Active   FLUoxetine (PROZAC) 20 MG capsule [id number] Yes  TAKE 3 CAPSULES(60 MG) BY MOUTH DAILY [place] [name], DO Taking Active   furosemide (LASIX) 20 MG tablet [id number] Yes Take 2 tablets (40 mg total) by mouth daily. If swelling is less, can take only 1 pill. [name], DO Taking Active   gabapentin (NEURONTIN) 300 MG capsule [id number] Yes Take 1 capsule (300 mg total) by mouth 3 (three) times daily. [name], MD Taking Active   gabapentin (NEURONTIN) 300 MG capsule [id number] No Take 3 capsules (900 mg total) by mouth 3 (three) times daily.  Patient not taking: Reported on 10/31/2021   [name], MD Not Taking Active   LORazepam (ATIVAN) 0.5 MG tablet [id number] Yes Take 1 tablet (0.5 mg total) by mouth every other day as needed (for severe anxiety). Norman Clay, MD Taking Active   Melatonin 3 MG CAPS [id number] Yes Take 10 mg by mouth at bedtime as needed (sleep).  [provider] Taking Active Self  metoprolol succinate (TOPROL-XL) 25 MG 24 hr tablet [id number] Yes TAKE 1/2 TABLET(12.5 MG) BY MOUTH DAILY [place], [name], DO Taking Active   montelukast (SINGULAIR) 10 MG tablet [id number] Yes TAKE 1 TABLET(10 MG) BY MOUTH AT BEDTIME [name], DO Taking Active   multivitamin-iron-minerals-folic acid (CENTRUM) chewable tablet [id number] Yes Chew 1 tablet by mouth daily. [provider] Taking Active   potassium chloride (KLOR-CON) 10 MEQ tablet [id number] Yes Take 1 tablet (10 mEq total) by mouth daily. [name], DO Taking Active   QUEtiapine (SEROQUEL) 200 MG tablet [id number] Yes Take 1 tablet (200 mg total) by mouth at bedtime. Norman Clay, MD Taking Active   Donnal Debar 200-62.5-25 MCG/ACT AEPB [id number] Yes INHALE 1 PUFF INTO THE LUNGS DAILY [name], MD Taking Active   Med List Note Rise Patience, RN 02/12/18 1055): Medication agreement signed 02/12/2018 UDS 02/24/17 03/25/17 PA requested for gabapentin sent SB               Assessment/Plan:   HFpEF/Hypertension: - Advised patient to restart monitoring home blood pressure, and keep log of BP/HR and bring record to medical appointments - Encourage patient to continue monitoring home weight and bring this record to medical appointments Remind patient to follow up with provider if has a weight change greater than established parameters - Recommend to contact PCP and Cardiology offices to schedule follow up appointments   COPD: - Reviewed appropriate inhaler use - Recommend to contact Pulmonology to schedule follow up appointment   Follow Up Plan: Clinic Pharmacist will outreach to patient by telephone again on 02/06/2022 at 10:30 AM  Wallace Cullens, PharmD, [place], [place] [phone]

## 2021-10-31 NOTE — Patient Instructions (Signed)
Goals Addressed             This Visit's Progress    Pharmacy Goals       Our goal bad cholesterol, or LDL, is less than 70. This is why it is important to continue taking your atorvastatin  Please check your home blood pressure and weights, keep a log of the results and bring this with you to your medical appointments.  Feel free to call me with any questions or concerns. I look forward to our next call!  Wallace Cullens, PharmD, [place] [phone]

## 2021-11-07 ENCOUNTER — Other Ambulatory Visit: Payer: Self-pay | Admitting: Family Medicine

## 2021-11-07 DIAGNOSIS — F332 Major depressive disorder, recurrent severe without psychotic features: Secondary | ICD-10-CM

## 2021-11-07 NOTE — Telephone Encounter (Signed)
Requested Prescriptions  Pending Prescriptions Disp Refills  . FLUoxetine (PROZAC) 20 MG capsule [Pharmacy Med Name: FLUOXETINE 20MG CAPSULES] 270 capsule 0    Sig: TAKE 3 CAPSULES(60 MG) BY MOUTH DAILY     Psychiatry:  Antidepressants - SSRI Passed - 11/07/2021 10:04 AM      Passed - Completed PHQ-2 or PHQ-9 in the last 360 days      Passed - Valid encounter within last 6 months    Recent Outpatient Visits          1 week ago COPD, severe ([place])   [place], RPH-CPP   1 month ago Nausea   [facility] Mecum, [place], [place]   5 months ago Recurrent falls   [place], DO   8 months ago Chronic diastolic congestive heart failure [facility])   [place], DO   11 months ago Major depressive disorder, recurrent, severe w/o psychotic behavior ([place])   [facility] [place], [name], DO             . DULoxetine (CYMBALTA) 20 MG capsule [Pharmacy Med Name: DULOXETINE DR 20MG CAPSULES] 90 capsule 3    Sig: TAKE 1 CAPSULE(20 MG) BY MOUTH DAILY     Psychiatry: Antidepressants - SNRI - duloxetine Passed - 11/07/2021 10:04 AM      Passed - Cr in normal range and within 360 days    Creat  Date Value Ref Range Status  09/21/2021 0.99 0.60 - 1.00 mg/dL Final         Passed - eGFR is 30 or above and within 360 days    GFR, Est African American  Date Value Ref Range Status  01/14/2020 63 > OR = 60 mL/min/1.24m Final   GFR, Est Non African American  Date Value Ref Range Status  01/14/2020 55 (L) > OR = 60 mL/min/1.733mFinal   eGFR  Date Value Ref Range Status  09/21/2021 59 (L) > OR = 60 mL/min/1.7342minal  01/01/2021 52 (L) >59 mL/min/1.73 Final         Passed - Completed PHQ-2 or PHQ-9 in the last 360 days      Passed - Last BP in normal range    BP Readings from Last 1 Encounters:  09/21/21 137/64         Passed - Valid  encounter within last 6 months    Recent Outpatient Visits          1 week ago COPD, severe ([place]CC[place] [facility] DiamondPH-CPP   1 month ago Nausea   [facility], [place] PA-[place] months ago Recurrent falls   [place]   8 months ago Chronic diastolic congestive heart failure ([facility] SouEndsocopy [facility]leDevonne DoughtyO   11 months ago Major depressive disorder, recurrent, severe w/o psychotic behavior (HCCL[place] SouCreek[facility] Center LPrDearbornleDevonne DoughtyO

## 2021-11-07 NOTE — Telephone Encounter (Signed)
Requested Prescriptions  Pending Prescriptions Disp Refills  . DULoxetine (CYMBALTA) 20 MG capsule [Pharmacy Med Name: DULOXETINE DR 20MG CAPSULES] 90 capsule 3    Sig: TAKE 1 CAPSULE(20 MG) BY MOUTH DAILY     Psychiatry: Antidepressants - SNRI - duloxetine Passed - 11/07/2021 10:04 AM      Passed - Cr in normal range and within 360 days    Creat  Date Value Ref Range Status  09/21/2021 0.99 0.60 - 1.00 mg/dL Final         Passed - eGFR is 30 or above and within 360 days    GFR, Est African American  Date Value Ref Range Status  01/14/2020 63 > OR = 60 mL/min/1.3m Final   GFR, Est Non African American  Date Value Ref Range Status  01/14/2020 55 (L) > OR = 60 mL/min/1.735mFinal   eGFR  Date Value Ref Range Status  09/21/2021 59 (L) > OR = 60 mL/min/1.7316minal  01/01/2021 52 (L) >59 mL/min/1.73 Final         Passed - Completed PHQ-2 or PHQ-9 in the last 360 days      Passed - Last BP in normal range    BP Readings from Last 1 Encounters:  09/21/21 137/64         Passed - Valid encounter within last 6 months    Recent Outpatient Visits          1 week ago COPD, severe ([facility]CC[place] [facility] [place], EliVirl DiamondPH-CPP   1 month ago Nausea   [facility], EriFort Ransom PA-[place] months ago Recurrent falls   Sou[place]O   8 months ago Chronic diastolic congestive heart failure ([facility] Sou[facility] RiteleDevonne DoughtyO   11 months ago Major depressive disorder, recurrent, severe w/o psychotic behavior (HCCSch[place] SouNaz[facility] HospitalrMount VernonleDevonne DoughtyO             Signed Prescriptions Disp Refills   FLUoxetine (PROZAC) 20 MG capsule 270 capsule 0    Sig: TAKE 3 CAPSULES(60 MG) BY MOUTH DAILY     Psychiatry:  Antidepressants - SSRI Passed - 11/07/2021 10:04 AM      Passed - Completed PHQ-2 or PHQ-9 in the last 360 days      Passed - Valid encounter  within last 6 months    Recent Outpatient Visits          1 week ago COPD, severe (HCCNew [place] SouAlliancehealth Seminolelles, EliVirl DiamondPH-CPP   1 month ago Nausea   [facility]cum, EriRed LakeA-[place] months ago Recurrent falls   SouPleasant ValleyO   8 months ago Chronic diastolic congestive heart failure (HCAurora San Diego SouLaser And Surgery Centre LLCleDevonne DoughtyO   11 months ago Major depressive disorder, recurrent, severe w/o psychotic behavior (HCCAz[place] SouSsm Health St. Mal[facility] Jefferson CityrIvanhoeleDevonne DoughtyO

## 2021-11-20 ENCOUNTER — Other Ambulatory Visit: Payer: Self-pay | Admitting: Family Medicine

## 2021-11-20 ENCOUNTER — Ambulatory Visit (INDEPENDENT_AMBULATORY_CARE_PROVIDER_SITE_OTHER): Payer: Medicare Other

## 2021-11-20 DIAGNOSIS — F332 Major depressive disorder, recurrent severe without psychotic features: Secondary | ICD-10-CM

## 2021-11-20 DIAGNOSIS — I5032 Chronic diastolic (congestive) heart failure: Secondary | ICD-10-CM

## 2021-11-20 DIAGNOSIS — F411 Generalized anxiety disorder: Secondary | ICD-10-CM

## 2021-11-20 DIAGNOSIS — I10 Essential (primary) hypertension: Secondary | ICD-10-CM

## 2021-11-20 NOTE — Chronic Care Management (AMB) (Signed)
Chronic Care Management Provider Comprehensive Care Plan    Name: Beth [name] MRN: [id number] DOB: [birth year]  Referral to Chronic Care Management (CCM) services was placed by Provider:  Dr. Amie Critchley  on Date: 10-31-2021 .  Interaction and coordination with outside resources, practitioners, and providers See CCM Referral  Chronic Condition 1: HF Provider Assessment and Plan  Chronic diastolic congestive heart failure (HCC) - Primary     Relevant Medications    furosemide (LASIX) 20 MG tablet    potassium chloride (KLOR-CON) 10 MEQ tablet  Residual dyspnea on exertion / cough wheezing and edema Last CXR mild pleural effusions already on diuretic. Hold on repeat x-ray immediately only 2 weeks later and improving.   Keep following w/ Cardiology as planned. And upcoming vascular.    Go ahead and increase dose Lasix (furosemide) from 1 pill daily now take TWO pills at same time daily in morning = '40mg'$  dose, some days you may take only ONE pill if not as bad swelling.   If breathing coughing wheezing does not improve within 3 days of double fluid pill - go ahead and take 1 week taper steroid.   May take a few more weeks to improve breathing.   Cardiology referred you to the Vascular doctors for the swelling.   She can call to schedule   [facility], [place],  [id number]  Main: [phone]    Expected Outcome/Goals Addressed This Visit: (Provider CCM Goals)/Provider Assessment and Plan  CCM Expected Outcome:  Monitor, Self-Manage and Reduce Symptoms of Heart Failure  Chronic Condition 2: Depression Provider Assessment and Plan Chronic mental health problems Recurrent depression / GAD / insomnia   Still waiting on geri psych age 89+ unable to identify provider she can go to, she has worked with CCM team and LCSW   Currently concern with polypharmacy, and risk of duplicate therapy at risk serotonin syndrome but current symptoms  not entirely consistent with that.   Will discontinue Duloxetine at this time. And continue SSRI Fluoxetine '20mg'$  x 3 = '60mg'$  daily   Review PDMP, for Clonazepam Continue Quetiapine   Recurrent Falls Attributed to dizziness and can be component of deconditioning too, will order handwritten rx Rollator walker w wheels and seat, she can go to med supply store. This would help her daily mobility function for ADLs to limit falls     Expected Outcome/Goals Addressed This Visit: (Provider CCM Goals)/Provider Assessment and Plan   CCM Expected Outcome:  Monitor, Self-Manage and Reduce Symptoms of  Depression  Problem List Patient Active Problem List   Diagnosis Date Noted   Chronic diastolic congestive heart failure ([place]) 11/14/2020   Rash 01/14/2020   Pulmonary hypertension ([place]) 09/14/2019   Coccydynia 07/29/2019   Cervicalgia 05/04/2019   Acute pain of left shoulder 05/04/2019   Elbow pain, left 05/04/2019   DOE (dyspnea on exertion) 03/23/2019   Leg swelling 03/22/2019   Lumbar degenerative disc disease 09/10/2018   Left hip pain 09/10/2018   Post-traumatic osteoarthritis of left elbow 07/14/2018   Lumbar radiculopathy 02/12/2018   Lumbar spondylosis 02/12/2018   Lumbar facet arthropathy 02/12/2018   Atherosclerosis of aorta ([place]) 09/17/2017   Lung nodule 09/17/2017   Urinary tract infection 12/05/2016   Protein-calorie malnutrition, mild (HCC) 08/07/2016   Generalized anxiety disorder 07/15/2016   Chronic pain syndrome 07/15/2016   Severe episode of recurrent major depressive disorder, without psychotic features ([place]) 07/14/2016   Moderate benzodiazepine use disorder ([place]) 05/08/2016  Major depressive disorder, recurrent, severe w/o psychotic behavior ([place]) 05/01/2016   Seasonal allergic rhinitis 03/23/2015   Hyperglycemia 03/23/2015   Senile purpura ([place]) 03/23/2015   Perennial allergic rhinitis with seasonal variation 03/23/2015   Marital problems 10/31/2014   Migraine  without aura and without status migrainosus, not intractable 10/31/2014   Myofascial pain syndrome of lumbar spine 08/09/2014   Colon polyp 08/09/2014   COPD, severe ([place]) 08/09/2014   CVA, old, hemiparesis ([place]) 08/09/2014   Dyslipidemia 08/09/2014   Dysfunction of eustachian tube 08/09/2014   Fibromyalgia syndrome 08/09/2014   Gastro-esophageal reflux disease without esophagitis 08/09/2014   Benign migrating glossitis 08/09/2014   Cerebrovascular accident, old 08/09/2014   IBS (irritable bowel syndrome) 08/09/2014   Low back pain with radiation 08/09/2014   Chronic recurrent major depressive disorder ([place]) 16/10/9602   Dysmetabolic syndrome 54/09/8117   OP (osteoporosis) 08/09/2014   Vitamin D deficiency 08/09/2014   Smoking 08/09/2014   Benign hypertension 07/19/2013   Benign neoplasm of skin of trunk 06/03/2013   H/O: pneumonia 09/25/2012    Medication Management Outpatient Encounter Medications as of 11/20/2021  Medication Sig   acetaminophen (TYLENOL) 500 MG tablet Take 500 mg by mouth 2 (two) times daily as needed.   albuterol (VENTOLIN HFA) 108 (90 Base) MCG/ACT inhaler Inhale into the lungs every 6 (six) hours as needed for wheezing or shortness of breath.   aspirin 81 MG chewable tablet Chew 81 mg by mouth daily.   atorvastatin (LIPITOR) 40 MG tablet TAKE 1 TABLET BY MOUTH EVERY DAY   FLUoxetine (PROZAC) 20 MG capsule TAKE 3 CAPSULES(60 MG) BY MOUTH DAILY   furosemide (LASIX) 20 MG tablet Take 2 tablets (40 mg total) by mouth daily. If swelling is less, can take only 1 pill.   gabapentin (NEURONTIN) 300 MG capsule Take 1 capsule (300 mg total) by mouth 3 (three) times daily.   gabapentin (NEURONTIN) 300 MG capsule Take 3 capsules (900 mg total) by mouth 3 (three) times daily. (Patient not taking: Reported on 10/31/2021)   LORazepam (ATIVAN) 0.5 MG tablet Take 1 tablet (0.5 mg total) by mouth every other day as needed (for severe anxiety).   Melatonin 3 MG CAPS Take 10 mg  by mouth at bedtime as needed (sleep).    metoprolol succinate (TOPROL-XL) 25 MG 24 hr tablet TAKE 1/2 TABLET(12.5 MG) BY MOUTH DAILY   montelukast (SINGULAIR) 10 MG tablet TAKE 1 TABLET(10 MG) BY MOUTH AT BEDTIME   multivitamin-iron-minerals-folic acid (CENTRUM) chewable tablet Chew 1 tablet by mouth daily.   potassium chloride (KLOR-CON) 10 MEQ tablet Take 1 tablet (10 mEq total) by mouth daily.   QUEtiapine (SEROQUEL) 200 MG tablet Take 1 tablet (200 mg total) by mouth at bedtime.   [place] 200-62.5-25 MCG/ACT AEPB INHALE 1 PUFF INTO THE LUNGS DAILY   No facility-administered encounter medications on file as of 11/20/2021.    Cognitive Assessment Identity Confirmed: '[x]'$  Name; '[x]'$  DOB Cognitive Status: '[x]'$  Normal '[]'$  Abnormal  Functional Status Survey: Is the patient deaf or have difficulty hearing?: No Does the patient have difficulty seeing, even when wearing glasses/contacts?: No Does the patient have difficulty concentrating, remembering, or making decisions?: No Does the patient have difficulty walking or climbing stairs?: Yes (uses a walker, high history of falls) Does the patient have difficulty dressing or bathing?: No Does the patient have difficulty doing errands alone such as visiting a doctor's office or shopping?: No  Caregiver Assessment  Current Living Arrangement: apartment Living arrangements - the patient  lives alone. Caregiver: self and children if needed  Caregiver Relation: Child Status: Stable, limited availability to assist

## 2021-11-20 NOTE — Patient Instructions (Signed)
Please call the care guide team at [phone] if you need to cancel or reschedule your appointment.   If you are experiencing a Mental Health or [place] or need someone to talk to, please call the Suicide and Crisis Lifeline: 988 call the [place] National Suicide Prevention Lifeline: 563-746-9248 or TTY: 475-414-3550 TTY [phone]) to talk to a trained counselor call 1-800-273-TALK (toll free, 24 hour hotline)   Following is a copy of your full provider care plan:   Goals Addressed             This Visit's Progress    CCM Expected Outcome:  Monitor, Self-Manage and Reduce Symptoms of  Depression       Current Barriers:  Care Coordination needs related to ongoing support and education needs for mental health  in a patient with depression and anxiety Chronic Disease Management support and education needs related to effective management of depression  Lacks caregiver support.   Planned Interventions: Evaluation of current treatment plan related to depression and anxiety and patient's adherence to plan as established by provider Advised patient to call the office for changes in mental health, mood, anxiety, and depression  Provided education to patient re: keeping provider appointments, working with specialist for mental  health needs, and working with the CCM team to effectively manage mental health and well being Reviewed medications with patient and discussed compliance. The patient has her medications and no issues with getting medications Collaborated with pcp, and LCSW regarding effective management of depression and mental health needs  Discussed plans with patient for ongoing care management follow up and provided patient with direct contact information for care management team Advised patient to discuss changes in her mental health and well being  with provider Screening for signs and symptoms of depression related to chronic disease state  Assessed social  determinant of health barriers Patient states that she had a bad fall 2 weeks ago. The patient has had a history of falls in the past. She is using her walker and review of fall and safety precautions. The patient states she is getting better each day. Rates her pain level at a 6 or 7 today. The patient declined the need for follow up appointment with pcp. Will send an in basket message to the pcp letting the pcp know of new fall 2 weeks ago.   Symptom Management: Attend all scheduled provider appointments Perform IADL's (shopping, preparing meals, housekeeping, managing finances) independently Call provider office for new concerns or questions  call the Suicide and Crisis Lifeline: 988 call the [place] National Suicide Prevention Lifeline: 754-124-0307 or TTY: 972-451-0915 TTY (385)584-5837) to talk to a trained counselor call 1-800-273-TALK (toll free, 24 hour hotline) if experiencing a Mental Health or [place]   Follow Up Plan: Telephone follow up appointment with care management team member scheduled for: 01-07-2022 at 1 pm       CCM Expected Outcome:  Monitor, Self-Manage and Reduce Symptoms of Heart Failure       Current Barriers:  Knowledge Deficits related to sx and sx management of HF Care Coordination needs related to ongoing support and education needs  in a patient with HF Chronic Disease Management support and education needs related to effective management of HF Lacks caregiver support.  Transportation barriers  Planned Interventions: Basic overview and discussion of pathophysiology of Heart Failure reviewed Provided education on low sodium diet Reviewed Heart Failure Action Plan in depth and provided written copy Assessed need for readable accurate  scales in home Provided education about placing scale on hard, flat surface Advised patient to weigh each morning after emptying bladder Discussed importance of daily weight and advised patient to weigh and  record daily Reviewed role of diuretics in prevention of fluid overload and management of heart failure Discussed the importance of keeping all appointments with provider Provided patient with education about the role of exercise in the management of heart failure Advised patient to discuss changes in heart health with provider  Symptom Management: Take medications as prescribed   Attend all scheduled provider appointments Call pharmacy for medication refills 3-7 days in advance of running out of medications Perform IADL's (shopping, preparing meals, housekeeping, managing finances) independently Call provider office for new concerns or questions  Work with the social worker to address care coordination needs and will continue to work with the clinical team to address health care and disease management related needs call the Suicide and Crisis Lifeline: 988 call the [place] National Suicide Prevention Lifeline: 928-592-4941 or TTY: 603-024-5469 TTY [phone]) to talk to a trained counselor call 1-800-273-TALK (toll free, 24 hour hotline) if experiencing a Mental Health or [employer]  call office if I gain more than 2 pounds in one day or 5 pounds in one week keep legs up while sitting use salt in moderation watch for swelling in feet, ankles and legs every day weigh myself daily develop a rescue plan follow rescue plan if symptoms flare-up eat more whole grains, fruits and vegetables, lean meats and healthy fats track symptoms and what helps feel better or worse dress right for the weather, hot or cold  Follow Up Plan: Telephone follow up appointment with care management team member scheduled for: 01-07-2022 at 1 pm       COMPLETED: RNCM: Effective management of mental health       Care Coordination Interventions: Evaluation of current treatment plan related to anxiety and depression and patient's adherence to plan as established by provider Advised patient to call  the office for changes in her mood, anxiety, depression, or mental health and well being Provided education to patient re: working with mental health team and care coordination team to meet the needs of her mental health and well being Reviewed medications with patient and discussed compliance Reviewed scheduled/upcoming provider appointments including telephone visit with mental health provider today. Was to be an onsite visit but the provider was sick and it is being changed to a telephone visit Discussed plans with patient for ongoing care management follow up and provided patient with direct contact information for care management team Advised patient to discuss discuss new concerns about her mental health with provider Screening for signs and symptoms of depression related to chronic disease state  Assessed social determinant of health barriers  Active listening / Reflection utilized  Emotional Support Provided         The patient verbalized understanding of instructions, educational materials, and care plan provided today and DECLINED offer to receive copy of patient instructions, educational materials, and care plan.   Telephone follow up appointment with care management team member scheduled for: 01-07-2022 at 1 pm

## 2021-11-20 NOTE — Chronic Care Management (AMB) (Signed)
CCM RN Visit Note   11-20-2021 Name: Beth [name] MRN: [id number]      DOB: [birth year]  Subjective: Beth [name] is a 76 y.o. year old female who is a primary care patient of [name], DO. The patient was referred to the Chronic Care Management team for assistance with care management needs subsequent to provider initiation of CCM services and plan of care.      Today's Visit: Engaged with patient by telephone for initial visit.   SDOH Interventions Today    Flowsheet Row Most Recent Value  SDOH Interventions   Housing Interventions Intervention Not Indicated        Goals Addressed             This Visit's Progress    CCM Expected Outcome:  Monitor, Self-Manage and Reduce Symptoms of  Depression       Current Barriers:  Care Coordination needs related to ongoing support and education needs for mental health  in a patient with depression and anxiety Chronic Disease Management support and education needs related to effective management of depression  Lacks caregiver support.   Planned Interventions: Evaluation of current treatment plan related to depression and anxiety and patient's adherence to plan as established by provider Advised patient to call the office for changes in mental health, mood, anxiety, and depression  Provided education to patient re: keeping provider appointments, working with specialist for mental  health needs, and working with the CCM team to effectively manage mental health and well being Reviewed medications with patient and discussed compliance. The patient has her medications and no issues with getting medications Collaborated with pcp, and LCSW regarding effective management of depression and mental health needs  Discussed plans with patient for ongoing care management follow up and provided patient with direct contact information for care management team Advised patient to discuss changes in her mental health and well being   with provider Screening for signs and symptoms of depression related to chronic disease state  Assessed social determinant of health barriers Patient states that she had a bad fall 2 weeks ago. The patient has had a history of falls in the past. She is using her walker and review of fall and safety precautions. The patient states she is getting better each day. Rates her pain level at a 6 or 7 today. The patient declined the need for follow up appointment with pcp. Will send an in basket message to the pcp letting the pcp know of new fall 2 weeks ago.   Symptom Management: Attend all scheduled provider appointments Perform IADL's (shopping, preparing meals, housekeeping, managing finances) independently Call provider office for new concerns or questions  call the Suicide and Crisis Lifeline: 988 call the [place] National Suicide Prevention Lifeline: 724-307-2338 or TTY: 325 048 7382 TTY (276) 764-9854) to talk to a trained counselor call 1-800-273-TALK (toll free, 24 hour hotline) if experiencing a Mental Health or [place]   Follow Up Plan: Telephone follow up appointment with care management team member scheduled for: 01-07-2022 at 1 pm       CCM Expected Outcome:  Monitor, Self-Manage and Reduce Symptoms of Heart Failure       Current Barriers:  Knowledge Deficits related to sx and sx management of HF Care Coordination needs related to ongoing support and education needs  in a patient with HF Chronic Disease Management support and education needs related to effective management of HF Lacks caregiver support.  Transportation barriers  Planned Interventions:  Basic overview and discussion of pathophysiology of Heart Failure reviewed Provided education on low sodium diet Reviewed Heart Failure Action Plan in depth and provided written copy Assessed need for readable accurate scales in home Provided education about placing scale on hard, flat surface Advised patient to weigh  each morning after emptying bladder Discussed importance of daily weight and advised patient to weigh and record daily Reviewed role of diuretics in prevention of fluid overload and management of heart failure Discussed the importance of keeping all appointments with provider Provided patient with education about the role of exercise in the management of heart failure Advised patient to discuss changes in heart health with provider  Symptom Management: Take medications as prescribed   Attend all scheduled provider appointments Call pharmacy for medication refills 3-7 days in advance of running out of medications Perform IADL's (shopping, preparing meals, housekeeping, managing finances) independently Call provider office for new concerns or questions  Work with the social worker to address care coordination needs and will continue to work with the clinical team to address health care and disease management related needs call the Suicide and Crisis Lifeline: 988 call the [place] National Suicide Prevention Lifeline: (619) 067-8880 or TTY: 612-063-9457 TTY 807-293-2909) to talk to a trained counselor call 1-800-273-TALK (toll free, 24 hour hotline) if experiencing a Mental Health or [place]  call office if I gain more than 2 pounds in one day or 5 pounds in one week keep legs up while sitting use salt in moderation watch for swelling in feet, ankles and legs every day weigh myself daily develop a rescue plan follow rescue plan if symptoms flare-up eat more whole grains, fruits and vegetables, lean meats and healthy fats track symptoms and what helps feel better or worse dress right for the weather, hot or cold  Follow Up Plan: Telephone follow up appointment with care management team member scheduled for: 01-07-2022 at 1 pm       COMPLETED: RNCM: Effective management of mental health       Care Coordination Interventions: Evaluation of current treatment plan related to  anxiety and depression and patient's adherence to plan as established by provider Advised patient to call the office for changes in her mood, anxiety, depression, or mental health and well being Provided education to patient re: working with mental health team and care coordination team to meet the needs of her mental health and well being Reviewed medications with patient and discussed compliance Reviewed scheduled/upcoming provider appointments including telephone visit with mental health provider today. Was to be an onsite visit but the provider was sick and it is being changed to a telephone visit Discussed plans with patient for ongoing care management follow up and provided patient with direct contact information for care management team Advised patient to discuss discuss new concerns about her mental health with provider Screening for signs and symptoms of depression related to chronic disease state  Assessed social determinant of health barriers  Active listening / Reflection utilized  Emotional Support Provided         Plan:Telephone follow up appointment with care management team member scheduled for:  01-07-2022 at 1 pm  [name] RN, MSN, CCM RN Care Manager  Chronic Care Management Direct Number: [phone]

## 2021-11-21 NOTE — Telephone Encounter (Signed)
Requested Prescriptions  Pending Prescriptions Disp Refills  . metoprolol succinate (TOPROL-XL) 25 MG 24 hr tablet [Pharmacy Med Name: METOPROLOL ER SUCCINATE '25MG'$  TABS] 45 tablet 0    Sig: TAKE 1/2 TABLET(12.5 MG) BY MOUTH DAILY     Cardiovascular:  Beta Blockers Passed - 11/20/2021 10:12 AM      Passed - Last BP in normal range    BP Readings from Last 1 Encounters:  09/21/21 137/64         Passed - Last Heart Rate in normal range    Pulse Readings from Last 1 Encounters:  09/21/21 95         Passed - Valid encounter within last 6 months    Recent Outpatient Visits          3 weeks ago COPD, severe ([place])   [facility], Virl Diamond, RPH-CPP   2 months ago Nausea   [facility] [place], [place], [place]   6 months ago Recurrent falls   [place], DO   9 months ago Chronic diastolic congestive heart failure [facility])   [place], DO   1 year ago Major depressive disorder, recurrent, severe w/o psychotic behavior ([place])   [facility] [name], DO      Future Appointments            In 1 month [name], MD [place]

## 2021-11-26 DIAGNOSIS — J441 Chronic obstructive pulmonary disease with (acute) exacerbation: Secondary | ICD-10-CM | POA: Diagnosis not present

## 2021-11-27 DIAGNOSIS — F332 Major depressive disorder, recurrent severe without psychotic features: Secondary | ICD-10-CM

## 2021-11-27 DIAGNOSIS — I5032 Chronic diastolic (congestive) heart failure: Secondary | ICD-10-CM
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
## 2021-11-29 ENCOUNTER — Encounter: Payer: Self-pay | Admitting: Psychiatry

## 2021-11-29 ENCOUNTER — Ambulatory Visit (INDEPENDENT_AMBULATORY_CARE_PROVIDER_SITE_OTHER): Payer: Medicare Other | Admitting: Psychiatry

## 2021-11-29 VITALS — BP 128/74 | HR 62 | Temp 97.6°F | Ht 65.0 in | Wt 141.1 lb

## 2021-11-29 DIAGNOSIS — F330 Major depressive disorder, recurrent, mild: Secondary | ICD-10-CM

## 2021-11-29 DIAGNOSIS — F411 Generalized anxiety disorder: Secondary | ICD-10-CM

## 2021-11-29 DIAGNOSIS — F4310 Post-traumatic stress disorder, unspecified: Secondary | ICD-10-CM

## 2021-11-29 MED ORDER — LORAZEPAM 0.5 MG PO TABS: 0.5000 mg | ORAL_TABLET | ORAL | 1 refills | Status: AC | PRN

## 2021-11-29 MED ORDER — QUETIAPINE FUMARATE 200 MG PO TABS: 200.0000 mg | ORAL_TABLET | Freq: Every day | ORAL | 0 refills | Status: DC

## 2021-11-29 MED ORDER — QUETIAPINE FUMARATE 50 MG PO TABS: 50.0000 mg | ORAL_TABLET | Freq: Every day | ORAL | 2 refills | Status: DC

## 2021-11-29 NOTE — Patient Instructions (Signed)
Continue fluoxetine 60 mg daily Increase quetiapine 250 mg at night Continue lorazepam 0.5 mg every other day as needed for extreme anxiety  Referral to therapy  Next appointment- 1/8 at 2:30

## 2021-12-05 ENCOUNTER — Telehealth: Payer: Self-pay | Admitting: Pulmonary Disease

## 2021-12-05 MED ORDER — TRELEGY ELLIPTA 200-62.5-25 MCG/ACT IN AEPB: 1.0000 | INHALATION_SPRAY | Freq: Every day | RESPIRATORY_TRACT | 0 refills | Status: DC

## 2021-12-05 NOTE — Telephone Encounter (Addendum)
I have sent in 1 refill of the Trelegy to the patient's pharmacy. That will last until her appointment on 01/01/2022. I notified the patient. Nothing further is needed.

## 2021-12-26 ENCOUNTER — Other Ambulatory Visit: Payer: Self-pay | Admitting: Family Medicine

## 2021-12-26 ENCOUNTER — Ambulatory Visit (INDEPENDENT_AMBULATORY_CARE_PROVIDER_SITE_OTHER): Payer: Medicare Other | Admitting: Licensed Clinical Social Worker

## 2021-12-26 DIAGNOSIS — F330 Major depressive disorder, recurrent, mild: Secondary | ICD-10-CM

## 2021-12-26 DIAGNOSIS — F411 Generalized anxiety disorder: Secondary | ICD-10-CM | POA: Diagnosis not present

## 2021-12-26 DIAGNOSIS — E785 Hyperlipidemia, unspecified: Secondary | ICD-10-CM

## 2021-12-26 NOTE — Plan of Care (Signed)
Pt initial visit on 12/26/2021 discussed treatment plan with pt  Problem: Depression  Goal:  Depression  Description: Reduce overall frequency, intensity and duration of depression so that daily functioning is not impaired per pt self report 3 out of 5 sessions documented.   Outcome: Not Progressing Goal: STG: Harshitha WILL PARTICIPATE IN AT LEAST 80% OF SCHEDULED INDIVIDUAL PSYCHOTHERAPY SESSIONS Outcome: Not Progressing   Problem: Anxiety  Goal:  Anxiety  Description: Reduce overall frequency, intensity and duration of anxiety so that daily functioning is not impaired per pt self report 3 out of 5 sessions.   Outcome: Not Progressing Goal: STG: Patient will participate in at least 80% of scheduled individual psychotherapy sessions Outcome: Not Progressing
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
## 2021-12-26 NOTE — Telephone Encounter (Signed)
Requested medication (s) are due for refill today: yes  Requested medication (s) are on the active medication list: yes  Last refill:  01/04/21 #90/3  Future visit scheduled: no  Notes to clinic:  Unable to refill per protocol due to failed labs, no updated results.    Requested Prescriptions  Pending Prescriptions Disp Refills   atorvastatin (LIPITOR) 40 MG tablet [Pharmacy Med Name: ATORVASTATIN '40MG'$  TABLETS] 90 tablet 3    Sig: TAKE 1 TABLET BY MOUTH EVERY DAY     Cardiovascular:  Antilipid - Statins Failed - 12/26/2021 10:03 AM      Failed - Lipid Panel in normal range within the last 12 months    Cholesterol, Total  Date Value Ref Range Status  03/23/2015 118 100 - 199 mg/dL Final   Cholesterol  Date Value Ref Range Status  04/10/2018 135 <200 mg/dL Final  12/25/2012 185 0 - 200 mg/dL Final   Ldl Cholesterol, Calc  Date Value Ref Range Status  12/25/2012 127 (H) 0 - 100 mg/dL Final   LDL Cholesterol (Calc)  Date Value Ref Range Status  04/10/2018 64 mg/dL (calc) Final    Comment:    Reference range: <100 . Desirable range <100 mg/dL for primary prevention;   <70 mg/dL for patients with CHD or diabetic patients  with > or = 2 CHD risk factors. Marland Kitchen LDL-C is now calculated using the Martin-Hopkins  calculation, which is a validated novel method providing  better accuracy than the Friedewald equation in the  estimation of LDL-C.  Cresenciano Genre et al. Annamaria Helling. 4132;440(10): 2061-2068  (http://education.[facility].com/faq/FAQ164)    HDL Cholesterol  Date Value Ref Range Status  12/25/2012 39 (L) 40 - 60 mg/dL Final   HDL  Date Value Ref Range Status  04/10/2018 59 > OR = 50 mg/dL Final  03/23/2015 44 >39 mg/dL Final   Triglycerides  Date Value Ref Range Status  04/10/2018 50 <150 mg/dL Final  12/25/2012 93 0 - 200 mg/dL Final         Passed - Patient is not pregnant      Passed - Valid encounter within last 12 months    Recent Outpatient Visits            1 month ago COPD, severe ([place])   [place], RPH-CPP   3 months ago Nausea   [facility] M[place], [place], [place]   7 months ago Recurrent falls   [place], DO   10 months ago Chronic diastolic congestive heart failure [facility])   [place], DO   1 year ago Major depressive disorder, recurrent, severe w/o psychotic behavior ([place])   [place], [name], DO       Future Appointments             In 6 days [name], MD [place]

## 2021-12-26 NOTE — Plan of Care (Signed)
Initial treatment plan 12/26/2021  Problem: Depression  Goal:  Depression  Description: Reduce overall frequency, intensity and duration of depression so that daily functioning is not impaired per pt self report 3 out of 5 sessions documented.   Outcome: Not Progressing Goal: STG: Beth [name] WILL PARTICIPATE IN AT LEAST 80% OF SCHEDULED INDIVIDUAL PSYCHOTHERAPY SESSIONS Outcome: Not Progressing   Problem: Anxiety  Goal:  Anxiety  Description: Reduce overall frequency, intensity and duration of anxiety so that daily functioning is not impaired per pt self report 3 out of 5 sessions.   Outcome: Not Progressing Goal: STG: Patient will participate in at least 80% of scheduled individual psychotherapy sessions Outcome: Not Progressing

## 2021-12-27 DIAGNOSIS — J441 Chronic obstructive pulmonary disease with (acute) exacerbation: Secondary | ICD-10-CM | POA: Diagnosis not present

## 2022-01-01 ENCOUNTER — Ambulatory Visit (INDEPENDENT_AMBULATORY_CARE_PROVIDER_SITE_OTHER): Payer: Medicare Other | Admitting: Pulmonary Disease

## 2022-01-01 ENCOUNTER — Encounter: Payer: Self-pay | Admitting: Pulmonary Disease

## 2022-01-01 VITALS — BP 138/80 | HR 68 | Temp 97.6°F | Ht 65.0 in | Wt 143.6 lb

## 2022-01-01 DIAGNOSIS — Z23 Encounter for immunization: Secondary | ICD-10-CM | POA: Diagnosis not present

## 2022-01-01 DIAGNOSIS — J439 Emphysema, unspecified: Secondary | ICD-10-CM | POA: Diagnosis not present

## 2022-01-01 DIAGNOSIS — J449 Chronic obstructive pulmonary disease, unspecified: Secondary | ICD-10-CM

## 2022-01-01 DIAGNOSIS — R0609 Other forms of dyspnea: Secondary | ICD-10-CM | POA: Diagnosis not present

## 2022-01-01 DIAGNOSIS — I272 Pulmonary hypertension, unspecified: Secondary | ICD-10-CM | POA: Diagnosis not present

## 2022-01-01 DIAGNOSIS — G4736 Sleep related hypoventilation in conditions classified elsewhere: Secondary | ICD-10-CM

## 2022-01-01 DIAGNOSIS — F17210 Nicotine dependence, cigarettes, uncomplicated: Secondary | ICD-10-CM

## 2022-01-01 NOTE — Patient Instructions (Signed)
We are going to get another echocardiogram to reevaluate the pressure of the artery going from your heart to your lungs to make sure that this is doing okay.  Please wear your oxygen at nighttime as this helps your heart.  We will see you in follow-up in 3 months time call sooner should any new problems arise.

## 2022-01-01 NOTE — Progress Notes (Signed)
Subjective:    Patient ID: Beth [name], female    DOB: Jun 16, 1945, 76 y.o.   MRN: [id number] Patient Care Team: Smitty Cords, DO as PCP - General (Family Medicine) Debbe Odea, MD as PCP - Cardiology (Cardiology) Edward Jolly, MD as Consulting Physician (Pain Medicine) Katheren Puller, MD as Referring Physician (Psychiatry) Ronney Asters Jackelyn Poling, RPH-CPP (Pharmacist) Marlowe Sax, RN as Case Manager (General Practice) Bridgett Larsson, LCSW as Social Worker (Licensed Clinical Social Worker)  Chief Complaint  Patient presents with   Follow-up    SOB with exertion. Some wheezing at night. No cough.     HPI Beth [name] presents today for follow-up of COPD. This is a scheduled visit.  She was last seen on 1 August 2022she states that she is doing well with Trelegy 200/62.5/25, 1 inhalation daily. She is rinsing her mouth well after use. She unfortunately continues to smoke anywhere between 6 to 8 cigarettes/day. She has issues with elevated pulmonary artery pressures noted on the 2D echo of April 2021, she has not had significant desaturations with ambulatory oximetry. Seems to note dyspnea on exertion however feels that this is somewhat better than prior. She is deconditioned and could benefit from a conditioning program. She has not had gastroesophageal reflux of late. She has not had any chest pain, no orthopnea, no paroxysmal nocturnal dyspnea. No other symptomatology.  No lower extremity edema or calf tenderness.    She is erratic with use of nocturnal oxygen, wears it "most nights".  Requests flu vaccine today.  Overall she feels well and looks well.    Review of Systems A 10 point review of systems was performed and it is as noted above otherwise negative.  Patient Active Problem List   Diagnosis Date Noted   Chronic diastolic congestive heart failure (HCC) 11/14/2020   Rash 01/14/2020   Pulmonary hypertension (HCC) 09/14/2019   Coccydynia  07/29/2019   Cervicalgia 05/04/2019   Acute pain of left shoulder 05/04/2019   Elbow pain, left 05/04/2019   DOE (dyspnea on exertion) 03/23/2019   Leg swelling 03/22/2019    Class: Acute   Lumbar degenerative disc disease 09/10/2018   Left hip pain 09/10/2018   Post-traumatic osteoarthritis of left elbow 07/14/2018   Lumbar radiculopathy 02/12/2018   Lumbar spondylosis 02/12/2018   Lumbar facet arthropathy 02/12/2018   Atherosclerosis of aorta (HCC) 09/17/2017   Lung nodule 09/17/2017   Urinary tract infection 12/05/2016   Protein-calorie malnutrition, mild (HCC) 08/07/2016   Generalized anxiety disorder 07/15/2016   Chronic pain syndrome 07/15/2016   Severe episode of recurrent major depressive disorder, without psychotic features (HCC) 07/14/2016   Moderate benzodiazepine use disorder (HCC) 05/08/2016   Major depressive disorder, recurrent, severe w/o psychotic behavior (HCC) 05/01/2016   Seasonal allergic rhinitis 03/23/2015   Hyperglycemia 03/23/2015   Senile purpura (HCC) 03/23/2015   Perennial allergic rhinitis with seasonal variation 03/23/2015   Marital problems 10/31/2014   Migraine without aura and without status migrainosus, not intractable 10/31/2014   Myofascial pain syndrome of lumbar spine 08/09/2014   Colon polyp 08/09/2014   COPD, severe (HCC) 08/09/2014   CVA, old, hemiparesis (HCC) 08/09/2014   Dyslipidemia 08/09/2014   Dysfunction of eustachian tube 08/09/2014   Fibromyalgia syndrome 08/09/2014   Gastro-esophageal reflux disease without esophagitis 08/09/2014   Benign migrating glossitis 08/09/2014   Cerebrovascular accident, old 08/09/2014   IBS (irritable bowel syndrome) 08/09/2014   Low back pain with radiation 08/09/2014   Chronic recurrent major depressive disorder (  HCC) 08/09/2014   Dysmetabolic syndrome 08/09/2014   OP (osteoporosis) 08/09/2014   Vitamin D deficiency 08/09/2014   Smoking 08/09/2014   Benign hypertension 07/19/2013   Benign  neoplasm of skin of trunk 06/03/2013   H/O: pneumonia 09/25/2012   Social History   Tobacco Use   Smoking status: Former    Packs/day: 0.25    Years: 57.00    Total pack years: 14.25    Types: Cigarettes    Quit date: 10/24/2020    Years since quitting: 1.1   Smokeless tobacco: Never   Tobacco comments:    6-8cig daily--01/01/2022  Substance Use Topics   Alcohol use: No    Alcohol/week: 0.0 standard drinks of alcohol   Allergies  Allergen Reactions   Bextra  [Valdecoxib]    Compazine [Prochlorperazine Edisylate]     Stroke-like symptoms   Lithium Carbonate     Leg weakness   Lyrica [Pregabalin]    Current Meds  Medication Sig   acetaminophen (TYLENOL) 500 MG tablet Take 500 mg by mouth 2 (two) times daily as needed.   albuterol (VENTOLIN HFA) 108 (90 Base) MCG/ACT inhaler Inhale into the lungs every 6 (six) hours as needed for wheezing or shortness of breath.   aspirin 81 MG chewable tablet Chew 81 mg by mouth daily.   atorvastatin (LIPITOR) 40 MG tablet TAKE 1 TABLET BY MOUTH EVERY DAY   FLUoxetine (PROZAC) 20 MG capsule TAKE 3 CAPSULES(60 MG) BY MOUTH DAILY   Fluticasone-Umeclidin-Vilant (TRELEGY ELLIPTA) 200-62.5-25 MCG/ACT AEPB Inhale 1 puff into the lungs daily.   furosemide (LASIX) 20 MG tablet Take 2 tablets (40 mg total) by mouth daily. If swelling is less, can take only 1 pill.   gabapentin (NEURONTIN) 300 MG capsule Take 1 capsule (300 mg total) by mouth 3 (three) times daily.   gabapentin (NEURONTIN) 300 MG capsule Take 3 capsules (900 mg total) by mouth 3 (three) times daily.   LORazepam (ATIVAN) 0.5 MG tablet Take 1 tablet (0.5 mg total) by mouth every other day as needed (for severe anxiety).   Melatonin 3 MG CAPS Take 10 mg by mouth at bedtime as needed (sleep).    metoprolol succinate (TOPROL-XL) 25 MG 24 hr tablet TAKE 1/2 TABLET(12.5 MG) BY MOUTH DAILY   montelukast (SINGULAIR) 10 MG tablet TAKE 1 TABLET(10 MG) BY MOUTH AT BEDTIME    multivitamin-iron-minerals-folic acid (CENTRUM) chewable tablet Chew 1 tablet by mouth daily.   potassium chloride (KLOR-CON) 10 MEQ tablet Take 1 tablet (10 mEq total) by mouth daily.   QUEtiapine (SEROQUEL) 200 MG tablet Take 1 tablet (200 mg total) by mouth at bedtime. Take total of 250 mg. Take along with 50 mg tab   QUEtiapine (SEROQUEL) 50 MG tablet Take 1 tablet (50 mg total) by mouth at bedtime. Take total of 250 mg. Take along with 200 mg tab   Immunization History  Administered Date(s) Administered   Fluad Quad(high Dose 65+) 11/14/2020   Influenza Inj Mdck Quad Pf 11/05/2017   Influenza Split 11/13/2006, 11/02/2008   Influenza, High Dose Seasonal PF 09/27/2014, 09/27/2014, 09/29/2015, 09/29/2015, 12/30/2016, 10/15/2018, 11/05/2019   Influenza, Seasonal, Injecte, Preservative Fre 11/12/2010   Influenza,inj,Quad PF,6+ Mos 10/08/2012   Influenza-Unspecified 04/08/2014   PFIZER(Purple Top)SARS-COV-2 Vaccination 03/14/2019, 04/09/2019, 11/05/2019   PPD Test 05/16/2016   Pneumococcal Conjugate-13 09/27/2014   Pneumococcal Polysaccharide-23 06/12/2010   Tdap 03/19/2007, 09/29/2015   Zoster Recombinat (Shingrix) 04/10/2018, 05/15/2021   Zoster, Live 04/16/2012       Objective:   Physical  Exam BP 138/80 (BP Location: Left Arm, Cuff Size: Normal)   Pulse 68   Temp 97.6 F (36.4 C)   Ht 5\' 5"  (1.651 m)   Wt 143 lb 9.6 oz (65.1 kg)   SpO2 95%   BMI 23.90 kg/m   SpO2: 95 % O2 Device: None (Room air)  GENERAL: Awake, alert, ambulatory.  No conversational dyspnea, no acute distress. HEAD: Normocephalic, atraumatic. EYES: Pupils equal, round, reactive to light.  No scleral icterus. MOUTH: Nose/mouth/throat not examined due to masking requirements for COVID 19. NECK: Supple. No thyromegaly. Trachea midline. No JVD.  No adenopathy. PULMONARY: Lungs clear to auscultation bilaterally. CARDIOVASCULAR: S1 and S2. Regular rate and rhythm.  There is Arctic sclerosis murmur grade 2/6,  no gallops.  No change from prior. GASTROINTESTINAL: Benign. MUSCULOSKELETAL: No joint deformity, no clubbing, no lower extremity edema.   NEUROLOGIC: Forgetful, no other overt focal deficit.  Speech is fluent. SKIN: Intact,warm,dry.  Chronic stasis changes of the lower extremity. PSYCH: Flat affect, normal behavior.     Assessment & Plan:     ICD-10-CM   1. Moderate COPD (chronic obstructive pulmonary disease) (HCC)  J44.9    Appears to be well compensated Continue Trelegy 200, 1 inhalation daily Continue as needed albuterol    2. DOE (dyspnea on exertion)  R06.09    No worsening Somewhat improved on Trelegy    3. Pulmonary hypertension (HCC)  I27.20 ECHOCARDIOGRAM COMPLETE   Will reassess with echocardiogram    4. Nocturnal hypoxemia due to emphysema [facility])  J43.9    G47.36    Advise compliance with nocturnal oxygen Discussed physiologic implications of hypoxemia    5. Need for immunization against influenza  Z23 Flu Vaccine QUAD High Dose(Fluad)   Provided with flu vaccine today    6. Tobacco dependence due to cigarettes  F17.210    Patient counseled regarding discontinuation of smoking     Orders Placed This Encounter  Procedures   Flu Vaccine QUAD High Dose(Fluad)   ECHOCARDIOGRAM COMPLETE    Standing Status:   Future    Standing Expiration Date:   01/02/2023    Order Specific Question:   Where should this test be performed    Answer:   [facility] [place]    Order Specific Question:   Perflutren DEFINITY (image enhancing agent) should be administered unless hypersensitivity or allergy exist    Answer:   Administer Perflutren    Order Specific Question:   Reason for exam-Echo    Answer:   Pulmonary hypertension I27.2   Smoking cessation instruction/counseling given:  counseled patient on the dangers of tobacco use, advised patient to stop smoking, and reviewed strategies to maximize success.  Will see the patient in follow-up in 3 months time she is to call  sooner should any problems arise.  Beth Shelter, MD Advanced Bronchoscopy PCCM [facility]    *This note was dictated using voice recognition software/Dragon.  Despite best efforts to proofread, errors can occur which can change the meaning. Any transcriptional errors that result from this process are unintentional and may not be fully corrected at the time of dictation.

## 2022-01-07 ENCOUNTER — Telehealth: Payer: Medicare Other

## 2022-01-08 ENCOUNTER — Other Ambulatory Visit: Payer: Self-pay | Admitting: Pulmonary Disease

## 2022-01-15 ENCOUNTER — Telehealth: Payer: Medicare Other

## 2022-01-15 ENCOUNTER — Telehealth: Payer: Self-pay

## 2022-01-15 NOTE — Telephone Encounter (Signed)
   CCM RN Visit Note   01-15-2022 Name: Beth [name] MRN: [id number]      DOB: Jul 20, 1945  Subjective: Beth [name] is a 76 y.o. year old female who is a primary care patient of Dr. Parks Ranger.The patient was referred to the Chronic Care Management team for assistance with care management needs subsequent to provider initiation of CCM services and plan of care.      An unsuccessful telephone outreach was attempted today to contact the patient about Chronic Care Management needs.    Plan:A HIPAA compliant phone message was left for the patient providing contact information and requesting a return call.  [name] RN, MSN, CCM RN Care Manager  Chronic Care Management Direct Number: [phone]

## 2022-01-16 ENCOUNTER — Ambulatory Visit (INDEPENDENT_AMBULATORY_CARE_PROVIDER_SITE_OTHER): Payer: Medicare Other | Admitting: Licensed Clinical Social Worker

## 2022-01-16 DIAGNOSIS — F411 Generalized anxiety disorder: Secondary | ICD-10-CM

## 2022-01-16 DIAGNOSIS — F4310 Post-traumatic stress disorder, unspecified: Secondary | ICD-10-CM | POA: Diagnosis not present

## 2022-01-16 DIAGNOSIS — F330 Major depressive disorder, recurrent, mild: Secondary | ICD-10-CM

## 2022-01-16 NOTE — Progress Notes (Signed)
THERAPIST PROGRESS NOTE  Session Time: 3:15PM- 4:00PM   Participation Level: Active  Behavioral Response: Well GroomedAlertAnxious  Type of Therapy: Individual Therapy  Treatment Goals addressed: Anxiety: Reduce overall frequency, intensity and duration of anxiety so that daily functioning is not impaired per pt self report 3 out of 5 sessions.   ProgressTowards Goals: Progressing  Interventions: CBT and Supportive  Pt presented in person at [facility] office. Pt and LCSW were present during the visit.    Summary: Beth [name] is a 76 y.o. female who presents with continuing symptoms of anxiety and depression. Pt stated that she lives alone with her dog.  Pt stated that she has been having feelings of being sad because her two close friends were talking about her in a negative way and that it was upsetting to her. Pt stated that she is looking forward to spending time with her family at Christmas and that she enjoys spending time with her dog "Ladell Heads".   Pt was able to explore in session ways that she can cope with her depression to include listening to music, talking to her son and walking her dog. Pt reported that she was going to try a new hobby and try to find other healthy ways to cope.   Pt stated that she has felt overwhelmed and that she was having a hard time with the changes of not being able to do the things she once did. Pt stated that she used to walk her dog longer and more often and that it is upsetting when she can't.   Pt stated that she once enjoyed sowing and that she was going to try and get some yarn and try that again as a way to cope.    Pt was able to explore how her feelings impact her thoughts and behaviors. Pt stated that around the holidays she thinks about her parents and family that she misses. Pt was able to explore the happy memories she has with her family and what they enjoyed doing at the holidays.   Allowed pt to  explore thoughts and feelings associated with life situations and external stressors. Encouraged expression of feelings and used empathic listening. Pt was oriented to time, place and situation. LCSW validated the pts feelings and thoughts and showed unconditional positive regard.   Encouraged pt to take medications as prescribed by their psychiatrist.    Suicidal/Homicidal: Nowithout intent/plan  Therapist Response: Educated on the importance of healthy coping skills with the pt. Explored the benefits of healthy coping skills and engaged the pt to discuss ways that they are coping with  her depression. Encouraged the pt to explore new ways to help alleviate depression and determined new ways to help with distraction and coping in session. Dicussed the benefits of exercise as a way of coping with depression and stress. Explored different ways that the pt could cope to include listening to music, being in nature, trying a new hobby and other activities that the pt could try to help with coping.    LCSW used CBT techniques to explore how thoughts,feelings and beliefs impact behavorial responses and physical symptoms. Explored how stressors can lead to negative thoughts that can be irrational or exaggerated which in return impacts how the person feels and can negatively impact how their body responds to include fatigue, sleep problems, poor concentration and loss of motivation. Explored how a behavioral response can create new stressors. Engaged with the pt to explore how their  thoughts and feelings impact their behaviors and choices.    Continued Recommendations as followed: Self-care behaviors, positive social engagements, focusing on positive physical and emotional wellness, and focusing on life/work balance.    Plan: Return again on 02/18/2022.   Diagnosis: Encounter Diagnoses  Name Primary?   MDD (major depressive disorder), recurrent episode, mild (HCC) Yes   Generalized anxiety disorder    PTSD  (post-traumatic stress disorder)      Collaboration of Care: Pt encouraged to continue care with psychiatrist of record Dr. Modesta Messing.    Patient/Guardian was advised Release of Information must be obtained prior to any record release in order to collaborate their care with an outside provider. Patient/Guardian was advised if they have not already done so to contact the registration department to sign all necessary forms in order for [place] to release information regarding their care.   Consent: Patient/Guardian gives verbal consent for treatment and assignment of benefits for services provided during this visit. Patient/Guardian expressed understanding and agreed to proceed.   Lorenda Hatchet 01/16/2022

## 2022-01-23 ENCOUNTER — Telehealth: Payer: Medicare Other

## 2022-01-26 DIAGNOSIS — J441 Chronic obstructive pulmonary disease with (acute) exacerbation: Secondary | ICD-10-CM | POA: Diagnosis not present

## 2022-01-31 ENCOUNTER — Ambulatory Visit: Payer: Medicare Other | Attending: Pulmonary Disease
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
## 2022-02-01 ENCOUNTER — Telehealth: Payer: Medicare Other

## 2022-02-01 ENCOUNTER — Ambulatory Visit (INDEPENDENT_AMBULATORY_CARE_PROVIDER_SITE_OTHER): Payer: Medicaid Other

## 2022-02-01 ENCOUNTER — Ambulatory Visit: Payer: Self-pay

## 2022-02-01 DIAGNOSIS — F411 Generalized anxiety disorder: Secondary | ICD-10-CM

## 2022-02-01 DIAGNOSIS — I5032 Chronic diastolic (congestive) heart failure: Secondary | ICD-10-CM

## 2022-02-01 DIAGNOSIS — F332 Major depressive disorder, recurrent severe without psychotic features: Secondary | ICD-10-CM

## 2022-02-01 NOTE — Plan of Care (Signed)
Chronic Care Management Provider Comprehensive Care Plan    02/01/2022 Name: Beth [name] MRN: [id number] DOB: Jul 20, 1945  Referral to Chronic Care Management (CCM) services was placed by Provider:  Dr. Parks Ranger on Date: 10-31-2021.  Chronic Condition 1: HF Provider Assessment and Plan  furosemide (LASIX) 20 MG tablet     potassium chloride (KLOR-CON) 10 MEQ tablet  Residual dyspnea on exertion / cough wheezing and edema Last CXR mild pleural effusions already on diuretic. Hold on repeat x-ray immediately only 2 weeks later and improving.   Keep following w/ Cardiology as planned. And upcoming vascular.    Go ahead and increase dose Lasix (furosemide) from 1 pill daily now take TWO pills at same time daily in morning = '40mg'$  dose, some days you may take only ONE pill if not as bad swelling.   If breathing coughing wheezing does not improve within 3 days of double fluid pill - go ahead and take 1 week taper steroid.   May take a few more weeks to improve breathing.   Cardiology referred you to the Vascular doctors for the swelling.   She can call to schedule     Expected Outcome/Goals Addressed This Visit (Provider CCM goals/Provider Assessment and plan   CCM (HEART FAILURE)  EXPECTED OUTCOME:  MONITOR, SELF-MANAGE AND REDUCE SYMPTOMS OF HEART FAILURE   Symptom Management Condition 1: Take all medications as prescribed Attend all scheduled provider appointments Call provider office for new concerns or questions  call the Suicide and Crisis Lifeline: 988 call the [place] National Suicide Prevention Lifeline: 678 493 2947 or TTY: 936-805-2874 TTY 323 807 6656) to talk to a trained counselor call 1-800-273-TALK (toll free, 24 hour hotline) if experiencing a Mental Health or [place]  call office if I gain more than 2 pounds in one day or 5 pounds in one week use salt in moderation watch for swelling in feet, ankles and legs every day weigh myself  daily develop a rescue plan follow rescue plan if symptoms flare-up eat more whole grains, fruits and vegetables, lean meats and healthy fats track symptoms and what helps feel better or worse dress right for the weather, hot or cold  Chronic Condition 2: Depression and Anxiety Provider Assessment and Plan Chronic mental health problems Recurrent depression / GAD / insomnia   Now established with behavioral health counselor, she has worked with CCM team and LCSW   Currently concern with polypharmacy, and risk of duplicate therapy at risk serotonin syndrome but current symptoms not entirely consistent with that.   Will discontinue Duloxetine at this time. And continue SSRI Fluoxetine '20mg'$  x 3 = '60mg'$  daily   Review PDMP, for Clonazepam Continue Quetiapine     Expected Outcome/Goals Addressed This Visit (Provider CCM goals/Provider Assessment and plan   CCM (Depression and Anxiety)  EXPECTED OUTCOME:  MONITOR, SELF-MANAGE AND REDUCE SYMPTOMS OF Depression and Anxiety   Symptom Management Condition 2: Take all medications as prescribed Attend all scheduled provider appointments Call provider office for new concerns or questions  call the Suicide and Crisis Lifeline: 988 call the [place] National Suicide Prevention Lifeline: (231)175-1880 or TTY: 6082607723 [place] 313-340-0266) to talk to a trained counselor call 1-800-273-TALK (toll free, 24 hour hotline) if experiencing a Mental Health or [place] Crisis   Problem List Patient Active Problem List   Diagnosis Date Noted   Chronic diastolic congestive heart failure ([place]) 11/14/2020   Rash 01/14/2020   Pulmonary hypertension ([place]) 09/14/2019   Coccydynia 07/29/2019   Cervicalgia 05/04/2019  Acute pain of left shoulder 05/04/2019   Elbow pain, left 05/04/2019   DOE (dyspnea on exertion) 03/23/2019   Leg swelling 03/22/2019    Class: Acute   Lumbar degenerative disc disease 09/10/2018   Left hip pain 09/10/2018    Post-traumatic osteoarthritis of left elbow 07/14/2018   Lumbar radiculopathy 02/12/2018   Lumbar spondylosis 02/12/2018   Lumbar facet arthropathy 02/12/2018   Atherosclerosis of aorta ([place]) 09/17/2017   Lung nodule 09/17/2017   Urinary tract infection 12/05/2016   Protein-calorie malnutrition, mild (HCC) 08/07/2016   Generalized anxiety disorder 07/15/2016   Chronic pain syndrome 07/15/2016   Severe episode of recurrent major depressive disorder, without psychotic features ([place]) 07/14/2016   Moderate benzodiazepine use disorder ([place]) 05/08/2016   Major depressive disorder, recurrent, severe w/o psychotic behavior ([place]) 05/01/2016   Seasonal allergic rhinitis 03/23/2015   Hyperglycemia 03/23/2015   Senile purpura ([place]) 03/23/2015   Perennial allergic rhinitis with seasonal variation 03/23/2015   Marital problems 10/31/2014   Migraine without aura and without status migrainosus, not intractable 10/31/2014   Myofascial pain syndrome of lumbar spine 08/09/2014   Colon polyp 08/09/2014   COPD, severe ([place]) 08/09/2014   CVA, old, hemiparesis ([place]) 08/09/2014   Dyslipidemia 08/09/2014   Dysfunction of eustachian tube 08/09/2014   Fibromyalgia syndrome 08/09/2014   Gastro-esophageal reflux disease without esophagitis 08/09/2014   Benign migrating glossitis 08/09/2014   Cerebrovascular accident, old 08/09/2014   IBS (irritable bowel syndrome) 08/09/2014   Low back pain with radiation 08/09/2014   Chronic recurrent major depressive disorder ([place]) 29/92/4268   Dysmetabolic syndrome 34/19/6222   OP (osteoporosis) 08/09/2014   Vitamin D deficiency 08/09/2014   Smoking 08/09/2014   Benign hypertension 07/19/2013   Benign neoplasm of skin of trunk 06/03/2013   H/O: pneumonia 09/25/2012    Medication Management  Current Outpatient Medications:    acetaminophen (TYLENOL) 500 MG tablet, Take 500 mg by mouth 2 (two) times daily as needed., Disp: , Rfl:    albuterol (VENTOLIN HFA) 108 (90  Base) MCG/ACT inhaler, Inhale into the lungs every 6 (six) hours as needed for wheezing or shortness of breath., Disp: , Rfl:    aspirin 81 MG chewable tablet, Chew 81 mg by mouth daily., Disp: , Rfl:    atorvastatin (LIPITOR) 40 MG tablet, TAKE 1 TABLET BY MOUTH EVERY DAY, Disp: 90 tablet, Rfl: 3   FLUoxetine (PROZAC) 20 MG capsule, TAKE 3 CAPSULES(60 MG) BY MOUTH DAILY, Disp: 270 capsule, Rfl: 0   furosemide (LASIX) 20 MG tablet, Take 2 tablets (40 mg total) by mouth daily. If swelling is less, can take only 1 pill., Disp: 180 tablet, Rfl: 3   gabapentin (NEURONTIN) 300 MG capsule, Take 1 capsule (300 mg total) by mouth 3 (three) times daily., Disp: 90 capsule, Rfl: 5   gabapentin (NEURONTIN) 300 MG capsule, Take 3 capsules (900 mg total) by mouth 3 (three) times daily., Disp: 270 capsule, Rfl: 5   LORazepam (ATIVAN) 0.5 MG tablet, Take 1 tablet (0.5 mg total) by mouth every other day as needed (for severe anxiety)., Disp: 15 tablet, Rfl: 1   Melatonin 3 MG CAPS, Take 10 mg by mouth at bedtime as needed (sleep). , Disp: , Rfl:    metoprolol succinate (TOPROL-XL) 25 MG 24 hr tablet, TAKE 1/2 TABLET(12.5 MG) BY MOUTH DAILY, Disp: 45 tablet, Rfl: 0   montelukast (SINGULAIR) 10 MG tablet, TAKE 1 TABLET(10 MG) BY MOUTH AT BEDTIME, Disp: 90 tablet, Rfl: 1   multivitamin-iron-minerals-folic acid (CENTRUM) chewable tablet,  Chew 1 tablet by mouth daily., Disp: , Rfl:    potassium chloride (KLOR-CON) 10 MEQ tablet, Take 1 tablet (10 mEq total) by mouth daily., Disp: 90 tablet, Rfl: 3   QUEtiapine (SEROQUEL) 200 MG tablet, Take 1 tablet (200 mg total) by mouth at bedtime. Take total of 250 mg. Take along with 50 mg tab, Disp: 90 tablet, Rfl: 0   QUEtiapine (SEROQUEL) 50 MG tablet, Take 1 tablet (50 mg total) by mouth at bedtime. Take total of 250 mg. Take along with 200 mg tab, Disp: 30 tablet, Rfl: 2   TRELEGY ELLIPTA 200-62.5-25 MCG/ACT AEPB, INHALE 1 PUFF INTO THE LUNGS DAILY, Disp: 60 each, Rfl:  0  Cognitive Assessment Identity Confirmed: : Name; DOB Cognitive Status: Normal   Functional Assessment Hearing Difficulty or Deaf: no Wear Glasses or Blind: no Concentrating, Remembering or Making Decisions Difficulty (CP): no Difficulty Communicating: no Difficulty Eating/Swallowing: no Walking or Climbing Stairs Difficulty: no Dressing/Bathing Difficulty: no Doing Errands Independently Difficulty (such as shopping) (CP): yes Errands Management: dependent on others to help her with her errands, the patient does not drive Change in Functional Status Since Onset of Current Illness/Injury: no   Caregiver Assessment  Primary Source of Support/Comfort: child(ren) Name of Support/Comfort Primary Source: Louie Casa the patients son People in Home: alone Family Caregiver if Needed: child(ren), adult Family Caregiver Names: Billi Bright Primary Roles/Responsibilities: retired Concerns About Impact on Relationships: feels like she doesn't have the best relationship with her children, especially her daughter   Planned Interventions  Evaluation of current treatment plan related to depression and anxiety and patient's adherence to plan as established by provider. The patient last spoke to her counselor on 01-16-2022. The patient feels that her depression and anxiety are stable at this time. She is sick with an URI and this in itself is depression for her. She states she has been having sx and sx from 5 days and waited as she felt that she was going to get better. She has chills, headache and coughing up of green sputum. She has a video visit scheduled for 02-04-2022 with pcp for evaluation of sx and sx. Education and support given.  Advised patient to call the office for changes in mental health, mood, anxiety, and depression. The patient is thankful for the support of the counselor and feels she can be open and honest about her mental health and well being. She was around her whole family for  Christmas and this made her happy even though everyone is sick now and has the same sx and sx.   Provided education to patient re: keeping provider appointments, working with specialist for mental  health needs, and working with the CCM team to effectively manage mental health and well being Reviewed medications with patient and discussed compliance. The patient has her medications and no issues with getting medications. States compliance with medications Collaborated with pcp, and LCSW regarding effective management of depression and mental health needs  Discussed plans with patient for ongoing care management follow up and provided patient with direct contact information for care management team Advised patient to discuss changes in her mental health and well being  with provider Screening for signs and symptoms of depression related to chronic disease state  Assessed social determinant of health barriers Patient states that she has an upper respiratory infection currently with sx and sx about 5 days. She states that she has a productive cough with green sputum, chills, and a headache. Review of what worse looks  like and to seek emergent care or worsening sx and sx. Also discussed making sure she stays hydrated. Will continue to monitor for changes.  Basic overview and discussion of pathophysiology of Heart Failure reviewed Provided education on low sodium diet. The patient currently has an URI. Review of monitoring dietary restrictions and making sure she stays adequately hydrated.  Reviewed Heart Failure Action Plan in depth Assessed need for readable accurate scales in home. The patient has a scales. Knows how to manage heart failure. Does have edema at times. Currently heart failure sx and sx are stable. Provided education about placing scale on hard, flat surface Advised patient to weigh each morning after emptying bladder Discussed importance of daily weight and advised patient to weigh and  record daily Reviewed role of diuretics in prevention of fluid overload and management of heart failure Discussed the importance of keeping all appointments with provider Provided patient with education about the role of exercise in the management of heart failure Advised patient to discuss changes in heart health with provider    Interaction and coordination with outside resources, practitioners, and providers See CCM Referral  Care Plan: Patient declined

## 2022-02-01 NOTE — Telephone Encounter (Signed)
2nd attempt, Patient called, left VM to return the call to the office to discuss symptoms with a nurse.  

## 2022-02-01 NOTE — Chronic Care Management (AMB) (Signed)
Chronic Care Management   CCM RN Visit Note  02/01/2022 Name: Beth [name] MRN: [id number] DOB: 11-11-1945  Subjective: Beth [name] is a 77 y.o. year old female who is a primary care patient of [name], DO. The patient was referred to the Chronic Care Management team for assistance with care management needs subsequent to provider initiation of CCM services and plan of care.    Today's Visit:  Engaged with patient by telephone for follow up visit.     SDOH Interventions Today    Flowsheet Row Most Recent Value  SDOH Interventions   Food Insecurity Interventions Intervention Not Indicated  Housing Interventions Intervention Not Indicated  Transportation Interventions Intervention Not Indicated  Utilities Interventions Intervention Not Indicated  Alcohol Usage Interventions Intervention Not Indicated (Score <7)  Financial Strain Interventions Intervention Not Indicated  Physical Activity Interventions Intervention Not Indicated  Stress Interventions Intervention Not Indicated  Social Connections Interventions Intervention Not Indicated         Goals Addressed             This Visit's Progress    CCM Expected Outcome:  Monitor, Self-Manage and Reduce Symptoms of  Depression and anxiety       Current Barriers:  Care Coordination needs related to ongoing support and education needs for mental health  in a patient with depression and anxiety Chronic Disease Management support and education needs related to effective management of depression  Lacks caregiver support.   Planned Interventions: Evaluation of current treatment plan related to depression and anxiety and patient's adherence to plan as established by provider. The patient last spoke to her counselor on 01-16-2022. The patient feels that her depression and anxiety are stable at this time. She is sick with an URI and this in itself is depression for her. She states she has been having sx and sx  from 5 days and waited as she felt that she was going to get better. She has chills, headache and coughing up of green sputum. She has a video visit scheduled for 02-04-2022 with pcp for evaluation of sx and sx. Education and support given.  Advised patient to call the office for changes in mental health, mood, anxiety, and depression. The patient is thankful for the support of the counselor and feels she can be open and honest about her mental health and well being. She was around her whole family for Christmas and this made her happy even though everyone is sick now and has the same sx and sx.   Provided education to patient re: keeping provider appointments, working with specialist for mental  health needs, and working with the CCM team to effectively manage mental health and well being Reviewed medications with patient and discussed compliance. The patient has her medications and no issues with getting medications. States compliance with medications Collaborated with pcp, and LCSW regarding effective management of depression and mental health needs  Discussed plans with patient for ongoing care management follow up and provided patient with direct contact information for care management team Advised patient to discuss changes in her mental health and well being  with provider Screening for signs and symptoms of depression related to chronic disease state  Assessed social determinant of health barriers Patient states that she has an upper respiratory infection currently with sx and sx about 5 days. She states that she has a productive cough with green sputum, chills, and a headache. Review of what worse looks like and to seek emergent  care or worsening sx and sx. Also discussed making sure she stays hydrated. Will continue to monitor for changes.   Symptom Management: Attend all scheduled provider appointments Perform IADL's (shopping, preparing meals, housekeeping, managing finances)  independently Call provider office for new concerns or questions  call the Suicide and Crisis Lifeline: 988 call the [place] National Suicide Prevention Lifeline: 661-157-9976 or TTY: (858)368-7717 TTY 7251464911) to talk to a trained counselor call 1-800-273-TALK (toll free, 24 hour hotline) if experiencing a Mental Health or [place]   Follow Up Plan: Telephone follow up appointment with care management team member scheduled for: 03-29-2022 at 345 pm       CCM Expected Outcome:  Monitor, Self-Manage and Reduce Symptoms of Heart Failure       Current Barriers:  Knowledge Deficits related to sx and sx management of HF Care Coordination needs related to ongoing support and education needs  in a patient with HF Chronic Disease Management support and education needs related to effective management of HF Lacks caregiver support.  Transportation barriers  Planned Interventions: Basic overview and discussion of pathophysiology of Heart Failure reviewed Provided education on low sodium diet. The patient currently has an URI. Review of monitoring dietary restrictions and making sure she stays adequately hydrated.  Reviewed Heart Failure Action Plan in depth Assessed need for readable accurate scales in home. The patient has a scales. Knows how to manage heart failure. Does have edema at times. Currently heart failure sx and sx are stable. Provided education about placing scale on hard, flat surface Advised patient to weigh each morning after emptying bladder Discussed importance of daily weight and advised patient to weigh and record daily Reviewed role of diuretics in prevention of fluid overload and management of heart failure Discussed the importance of keeping all appointments with provider Provided patient with education about the role of exercise in the management of heart failure Advised patient to discuss changes in heart health with provider  Symptom Management: Take  medications as prescribed   Attend all scheduled provider appointments Call pharmacy for medication refills 3-7 days in advance of running out of medications Perform IADL's (shopping, preparing meals, housekeeping, managing finances) independently Call provider office for new concerns or questions  Work with the social worker to address care coordination needs and will continue to work with the clinical team to address health care and disease management related needs call the Suicide and Crisis Lifeline: 988 call the [place] National Suicide Prevention Lifeline: (431)191-0554 or TTY: (405) 832-5281 TTY 640-673-4800) to talk to a trained counselor call 1-800-273-TALK (toll free, 24 hour hotline) if experiencing a Mental Health or [place]  call office if I gain more than 2 pounds in one day or 5 pounds in one week keep legs up while sitting use salt in moderation watch for swelling in feet, ankles and legs every day weigh myself daily develop a rescue plan follow rescue plan if symptoms flare-up eat more whole grains, fruits and vegetables, lean meats and healthy fats track symptoms and what helps feel better or worse dress right for the weather, hot or cold  Follow Up Plan: Telephone follow up appointment with care management team member scheduled for: 03-29-2022 at 345 pm          Plan:Telephone follow up appointment with care management team member scheduled for:  03-29-2022 at 345 pm  [place], MSN, CCM RN Care Manager  Chronic Care Management Direct Number: [phone]

## 2022-02-01 NOTE — Patient Instructions (Signed)
Please call the care guide team at [phone] if you need to cancel or reschedule your appointment.   If you are experiencing a Mental Health or [employer] or need someone to talk to, please call the Suicide and Crisis Lifeline: 988 call the [place] National Suicide Prevention Lifeline: (534)281-2749 or TTY: 205-309-4870 TTY (724)178-7452) to talk to a trained counselor call 1-800-273-TALK (toll free, 24 hour hotline)   Following is a copy of the CCM Program Consent:  CCM service includes personalized support from designated clinical staff supervised by the physician, including individualized plan of care and coordination with other care providers 24/7 contact phone numbers for assistance for urgent and routine care needs. Service will only be billed when office clinical staff spend 20 minutes or more in a month to coordinate care. Only one practitioner may furnish and bill the service in a calendar month. The patient may stop CCM services at amy time (effective at the end of the month) by phone call to the office staff. The patient will be responsible for cost sharing (co-pay) or up to 20% of the service fee (after annual deductible is met)  Following is a copy of your full provider care plan:   Goals Addressed             This Visit's Progress    CCM Expected Outcome:  Monitor, Self-Manage and Reduce Symptoms of  Depression and anxiety       Current Barriers:  Care Coordination needs related to ongoing support and education needs for mental health  in a patient with depression and anxiety Chronic Disease Management support and education needs related to effective management of depression  Lacks caregiver support.   Planned Interventions: Evaluation of current treatment plan related to depression and anxiety and patient's adherence to plan as established by provider. The patient last spoke to her counselor on 01-16-2022. The patient feels that her depression and anxiety  are stable at this time. She is sick with an URI and this in itself is depression for her. She states she has been having sx and sx from 5 days and waited as she felt that she was going to get better. She has chills, headache and coughing up of green sputum. She has a video visit scheduled for 02-04-2022 with pcp for evaluation of sx and sx. Education and support given.  Advised patient to call the office for changes in mental health, mood, anxiety, and depression. The patient is thankful for the support of the counselor and feels she can be open and honest about her mental health and well being. She was around her whole family for Christmas and this made her happy even though everyone is sick now and has the same sx and sx.   Provided education to patient re: keeping provider appointments, working with specialist for mental  health needs, and working with the CCM team to effectively manage mental health and well being Reviewed medications with patient and discussed compliance. The patient has her medications and no issues with getting medications. States compliance with medications Collaborated with pcp, and LCSW regarding effective management of depression and mental health needs  Discussed plans with patient for ongoing care management follow up and provided patient with direct contact information for care management team Advised patient to discuss changes in her mental health and well being  with provider Screening for signs and symptoms of depression related to chronic disease state  Assessed social determinant of health barriers Patient states that she has  an upper respiratory infection currently with sx and sx about 5 days. She states that she has a productive cough with green sputum, chills, and a headache. Review of what worse looks like and to seek emergent care or worsening sx and sx. Also discussed making sure she stays hydrated. Will continue to monitor for changes.   Symptom  Management: Attend all scheduled provider appointments Perform IADL's (shopping, preparing meals, housekeeping, managing finances) independently Call provider office for new concerns or questions  call the Suicide and Crisis Lifeline: 988 call the [place] National Suicide Prevention Lifeline: 651-737-9425 or TTY: (865) 257-7933 TTY 570-683-7500) to talk to a trained counselor call 1-800-273-TALK (toll free, 24 hour hotline) if experiencing a Mental Health or [place]   Follow Up Plan: Telephone follow up appointment with care management team member scheduled for: 03-29-2022 at 345 pm       CCM Expected Outcome:  Monitor, Self-Manage and Reduce Symptoms of Heart Failure       Current Barriers:  Knowledge Deficits related to sx and sx management of HF Care Coordination needs related to ongoing support and education needs  in a patient with HF Chronic Disease Management support and education needs related to effective management of HF Lacks caregiver support.  Transportation barriers  Planned Interventions: Basic overview and discussion of pathophysiology of Heart Failure reviewed Provided education on low sodium diet. The patient currently has an URI. Review of monitoring dietary restrictions and making sure she stays adequately hydrated.  Reviewed Heart Failure Action Plan in depth Assessed need for readable accurate scales in home. The patient has a scales. Knows how to manage heart failure. Does have edema at times. Currently heart failure sx and sx are stable. Provided education about placing scale on hard, flat surface Advised patient to weigh each morning after emptying bladder Discussed importance of daily weight and advised patient to weigh and record daily Reviewed role of diuretics in prevention of fluid overload and management of heart failure Discussed the importance of keeping all appointments with provider Provided patient with education about the role of exercise  in the management of heart failure Advised patient to discuss changes in heart health with provider  Symptom Management: Take medications as prescribed   Attend all scheduled provider appointments Call pharmacy for medication refills 3-7 days in advance of running out of medications Perform IADL's (shopping, preparing meals, housekeeping, managing finances) independently Call provider office for new concerns or questions  Work with the social worker to address care coordination needs and will continue to work with the clinical team to address health care and disease management related needs call the Suicide and Crisis Lifeline: 988 call the [place] National Suicide Prevention Lifeline: (757)152-0676 or TTY: 740 171 4253 TTY 6717473688) to talk to a trained counselor call 1-800-273-TALK (toll free, 24 hour hotline) if experiencing a Mental Health or [place]  call office if I gain more than 2 pounds in one day or 5 pounds in one week keep legs up while sitting use salt in moderation watch for swelling in feet, ankles and legs every day weigh myself daily develop a rescue plan follow rescue plan if symptoms flare-up eat more whole grains, fruits and vegetables, lean meats and healthy fats track symptoms and what helps feel better or worse dress right for the weather, hot or cold  Follow Up Plan: Telephone follow up appointment with care management team member scheduled for: 03-29-2022 at 345 pm          The  patient verbalized understanding of instructions, educational materials, and care plan provided today and DECLINED offer to receive copy of patient instructions, educational materials, and care plan.   Telephone follow up appointment with care management team member scheduled for: 03-29-2022 at 345 pm

## 2022-02-01 NOTE — Telephone Encounter (Signed)
Patient called, left VM to return the call to the office to discuss symptoms with a nurse.   Summary: respiratory infection?   Patient states that she think she has a respiratory infection? She has been coughing up green mucus, bad headache, hurting in chest, stuffy nose, and patient  has COPD. Earliest appointment showing is 02/04/22. Patient is wanting advise on what she can take or if something could be called in. Advised that their will need to be an appointment for medication. Patient is still wanting advise or medication.  Please advise

## 2022-02-01 NOTE — Telephone Encounter (Signed)
     Chief Complaint: Cough with green mucus, asking to be worked in today for a phone visit. "I can't come into the office." Virtual visit scheduled for Monday. Please advise pt. Symptoms: Above Frequency: 01/27/22 Pertinent Negatives: Patient denies fever Disposition: '[]'$ ED /'[]'$ Urgent Care (no appt availability in office) / '[]'$ Appointment(In office/virtual)/ '[]'$  [facility]/ '[]'$ Home Care/ '[]'$ Refused Recommended Disposition /'[]'$ [facility]/ '[x]'$  Follow-up with PCP Additional Notes:   Reason for Disposition  [1] Known COPD or other severe lung disease (i.e., bronchiectasis, cystic fibrosis, lung surgery) AND [2] worsening symptoms (i.e., increased sputum purulence or amount, increased breathing difficulty  Answer Assessment - Initial Assessment Questions 1. ONSET: "When did the cough begin?"      01/27/22 2. SEVERITY: "How bad is the cough today?"      Severe 3. SPUTUM: "Describe the color of your sputum" (none, dry cough; clear, white, yellow, green)     Dark green 4. HEMOPTYSIS: "Are you coughing up any blood?" If so ask: "How much?" (flecks, streaks, tablespoons, etc.)     No 5. DIFFICULTY BREATHING: "Are you having difficulty breathing?" If Yes, ask: "How bad is it?" (e.g., mild, moderate, severe)    - MILD: No SOB at rest, mild SOB with walking, speaks normally in sentences, can lie down, no retractions, pulse < 100.    - MODERATE: SOB at rest, SOB with minimal exertion and prefers to sit, cannot lie down flat, speaks in phrases, mild retractions, audible wheezing, pulse 100-120.    - SEVERE: Very SOB at rest, speaks in single words, struggling to breathe, sitting hunched forward, retractions, pulse > 120      Mild 6. FEVER: "Do you have a fever?" If Yes, ask: "What is your temperature, how was it measured, and when did it start?"     Yes 7. CARDIAC HISTORY: "Do you have any history of heart disease?" (e.g., heart attack, congestive heart failure)      No 8. LUNG  HISTORY: "Do you have any history of lung disease?"  (e.g., pulmonary embolus, asthma, emphysema)     COPD 9. PE RISK FACTORS: "Do you have a history of blood clots?" (or: recent major surgery, recent prolonged travel, bedridden)     nO 10. OTHER SYMPTOMS: "Do you have any other symptoms?" (e.g., runny nose, wheezing, chest pain)       Wheezing 11. PREGNANCY: "Is there any chance you are pregnant?" "When was your last menstrual period?"       No 12. TRAVEL: "Have you traveled out of the country in the last month?" (e.g., travel history, exposures)       No  Protocols used: Cough - Acute Productive-A-AH

## 2022-02-04 ENCOUNTER — Telehealth (INDEPENDENT_AMBULATORY_CARE_PROVIDER_SITE_OTHER): Payer: Medicare Other | Admitting: Family Medicine

## 2022-02-04 ENCOUNTER — Encounter: Payer: Self-pay | Admitting: Family Medicine

## 2022-02-04 ENCOUNTER — Ambulatory Visit: Payer: Medicare Other | Admitting: Psychiatry

## 2022-02-04 DIAGNOSIS — J441 Chronic obstructive pulmonary disease with (acute) exacerbation: Secondary | ICD-10-CM

## 2022-02-04 DIAGNOSIS — J209 Acute bronchitis, unspecified: Secondary | ICD-10-CM | POA: Diagnosis not present

## 2022-02-04 MED ORDER — PREDNISONE 20 MG PO TABS: ORAL_TABLET | ORAL | 0 refills | Status: DC

## 2022-02-04 MED ORDER — AZITHROMYCIN 250 MG PO TABS: ORAL_TABLET | ORAL | 0 refills | Status: DC

## 2022-02-04 NOTE — Patient Instructions (Addendum)
   Please schedule a Follow-up Appointment to: Return if symptoms worsen or fail to improve.  If you have any other questions or concerns, please feel free to call the office or send a message through MyChart. You may also schedule an earlier appointment if necessary.  Additionally, you may be receiving a survey about your experience at our office within a few days to 1 week by e-mail or mail. We value your feedback.  Shannon Balthazar, DO [facility], [facility] 

## 2022-02-04 NOTE — Progress Notes (Signed)
Subjective:    Patient ID: Beth [name], female    DOB: [birth year], 77 y.o.   MRN: [id number]  Beth [name] is a 77 y.o. female presenting on 02/04/2022 for No chief complaint on file.  Virtual / Telehealth Encounter - Video Visit via MyChart The purpose of this virtual visit is to provide medical care while limiting exposure to the novel coronavirus (COVID19) for both patient and office staff.  Consent was obtained for remote visit:  Yes.   Answered questions that patient had about telehealth interaction:  Yes.   I discussed the limitations, risks, security and privacy concerns of performing an evaluation and management service by video/telephone. I also discussed with the patient that there may be a patient responsible charge related to this service. The patient expressed understanding and agreed to proceed.  Patient Location: Home Provider Location: [facility] (Office)  Participants in virtual visit: - Patient: [place]: Orinda Kenner, CMA - Provider: Dr Parks Ranger    HPI  Acute COPD Exacerbation She reports acute viral syndrome onset >10 days ago with cough congestion, initial headache and chest discomfort with coughing spells, wheezing. Sick contacts recently, unsure exactly which virus or infection. Some recent contact were treated with Antibiotics and cough syrup She uses her inhalers Trelegy regularly and Albuterol AS NEEDED Admits some wheezing cough Denies fever     01/16/2022    4:06 PM 12/26/2021    4:07 PM 11/29/2021    2:57 PM  Depression screen PHQ 2/9  Decreased Interest     Down, Depressed, Hopeless     PHQ - 2 Score     Altered sleeping     Tired, decreased energy     Change in appetite     Feeling bad or failure about yourself      Trouble concentrating     Moving slowly or fidgety/restless     Suicidal thoughts     PHQ-9 Score     Difficult doing work/chores        Information is confidential and  restricted. Go to Review Flowsheets to unlock data.    Social History   Tobacco Use   Smoking status: Former    Packs/day: 0.25    Years: 57.00    Total pack years: 14.25    Types: Cigarettes    Quit date: 10/24/2020    Years since quitting: 1.2   Smokeless tobacco: Never   Tobacco comments:    6-8cig daily--01/01/2022  Vaping Use   Vaping Use: Never used  Substance Use Topics   Alcohol use: No    Alcohol/week: 0.0 standard drinks of alcohol   Drug use: No    Review of Systems Per HPI unless specifically indicated above     Objective:    There were no vitals taken for this visit.  Wt Readings from Last 3 Encounters:  01/01/22 143 lb 9.6 oz (65.1 kg)  10/05/21 138 lb (62.6 kg)  09/21/21 138 lb (62.6 kg)    Physical Exam  Note examination was completely remotely via video observation objective data only  Gen - well but tired-appearing, no acute distress or apparent pain, comfortable HEENT - eyes appear clear without discharge or redness Heart/Lungs - cannot examine virtually - observed no evidence of coughing or labored breathing. Abd - cannot examine virtually  Skin - face visible today- no rash Neuro - awake, alert, oriented Psych - not anxious appearing    Results for orders placed or  performed in visit on 09/21/21  Urine Culture   Specimen: Urine  Result Value Ref Range   MICRO NUMBER: [id number]    SPECIMEN QUALITY: Adequate    Sample Source URINE    STATUS: FINAL    Result:      Mixed genital flora isolated. These superficial bacteria are not indicative of a urinary tract infection. No further organism identification is warranted on this specimen. If clinically indicated, recollect clean-catch, mid-stream urine and transfer  immediately to Urine Culture Transport Tube.   COMPLETE METABOLIC PANEL WITH GFR  Result Value Ref Range   Glucose, Bld 96 65 - 99 mg/dL   BUN 10 7 - 25 mg/dL   Creat 0.99 0.60 - 1.00 mg/dL   eGFR 59 (L) > OR = 60 mL/min/1.20m    BUN/Creatinine Ratio SEE NOTE: 6 - 22 (calc)   Sodium 142 135 - 146 mmol/L   Potassium 3.6 3.5 - 5.3 mmol/L   Chloride 103 98 - 110 mmol/L   CO2 24 20 - 32 mmol/L   Calcium 9.4 8.6 - 10.4 mg/dL   Total Protein 6.9 6.1 - 8.1 g/dL   Albumin 3.8 3.6 - 5.1 g/dL   Globulin 3.1 1.9 - 3.7 g/dL (calc)   AG Ratio 1.2 1.0 - 2.5 (calc)   Total Bilirubin 0.5 0.2 - 1.2 mg/dL   Alkaline phosphatase (APISO) 109 37 - 153 U/L   AST 93 (H) 10 - 35 U/L   ALT 87 (H) 6 - 29 U/L  CBC w/Diff/Platelet  Result Value Ref Range   WBC 11.6 (H) 3.8 - 10.8 Thousand/uL   RBC 4.18 3.80 - 5.10 Million/uL   Hemoglobin 13.0 11.7 - 15.5 g/dL   HCT 39.4 35.0 - 45.0 %   MCV 94.3 80.0 - 100.0 fL   MCH 31.1 27.0 - 33.0 pg   MCHC 33.0 32.0 - 36.0 g/dL   RDW 13.1 11.0 - 15.0 %   Platelets 270 140 - 400 Thousand/uL   MPV 10.4 7.5 - 12.5 fL   Neutro Abs 8,480 (H) 1,500 - 7,800 cells/uL   Lymphs Abs 2,274 850 - 3,900 cells/uL   Absolute Monocytes 742 200 - 950 cells/uL   Eosinophils Absolute 70 15 - 500 cells/uL   Basophils Absolute 35 0 - 200 cells/uL   Neutrophils Relative % 73.1 %   Total Lymphocyte 19.6 %   Monocytes Relative 6.4 %   Eosinophils Relative 0.6 %   Basophils Relative 0.3 %  POCT Urinalysis Dipstick  Result Value Ref Range   Color, UA Yellow    Clarity, UA Cloudy    Glucose, UA Negative Negative   Bilirubin, UA Moderate ++    Ketones, UA Negative    Spec Grav, UA 1.015 1.010 - 1.025   Blood, UA Large +++    pH, UA 5.0 5.0 - 8.0   Protein, UA Positive (A) Negative   Urobilinogen, UA 0.2 0.2 or 1.0 E.U./dL   Nitrite, UA Negative    Leukocytes, UA Large (3+) (A) Negative   Appearance     Odor        Assessment & Plan:   Problem List Items Addressed This Visit   None Visit Diagnoses     Acute bronchitis with COPD ([facility]    -  Primary   Relevant Medications   azithromycin (ZITHROMAX Z-PAK) 250 MG tablet   predniSONE (DELTASONE) 20 MG tablet   Centrilobular emphysema (HCC)        Relevant Medications   azithromycin (  ZITHROMAX Z-PAK) 250 MG tablet   predniSONE (DELTASONE) 20 MG tablet       Consistent with mild acute exacerbation of COPD with worsening productive cough. Similar to prior exacerbations  Possible viral illness onset, but virtual today cannot test and >7-10 days since onset, less utility She is afebrile.  Plan: 1. Treat underlying AECOPD - start Prednisone taper and Start Azithromycin Z pak (antibiotic) 2 tabs day 1, then 1 tab x 4 days, complete entire course even if improved 2. Use albuterol q 4 hr regularly x 2-3 days. Continue maintenance inhalers 3. Continue Trelegy  Return precautions given  Meds ordered this encounter  Medications   azithromycin (ZITHROMAX Z-PAK) 250 MG tablet    Sig: Take 2 tabs ('500mg'$  total) on Day 1. Take 1 tab ('250mg'$ ) daily for next 4 days.    Dispense:  6 tablet    Refill:  0   predniSONE (DELTASONE) 20 MG tablet    Sig: Take daily with food. Start with '60mg'$  (3 pills) x 2 days, then reduce to '40mg'$  (2 pills) x 2 days, then '20mg'$  (1 pill) x 3 days    Dispense:  13 tablet    Refill:  0      Follow up plan: Return if symptoms worsen or fail to improve.  Patient verbalizes understanding with the above medical recommendations including the limitation of remote medical advice.  Specific follow-up and call-back criteria were given for patient to follow-up or seek medical care more urgently if needed.  Total duration of direct patient care provided via video conference: 10 minutes    Nobie Putnam, [facility] 02/04/2022, 11:40 AM

## 2022-02-06 ENCOUNTER — Ambulatory Visit: Payer: Medicare Other | Admitting: Pharmacist

## 2022-02-06 DIAGNOSIS — I10 Essential (primary) hypertension: Secondary | ICD-10-CM

## 2022-02-06 DIAGNOSIS — J432 Centrilobular emphysema: Secondary | ICD-10-CM

## 2022-02-06 MED ORDER — ALBUTEROL SULFATE HFA 108 (90 BASE) MCG/ACT IN AERS: 1.0000 | INHALATION_SPRAY | Freq: Four times a day (QID) | RESPIRATORY_TRACT | 2 refills | Status: DC | PRN

## 2022-02-06 NOTE — Patient Instructions (Signed)
Visit Information  Thank you for taking time to visit with me today. Please don't hesitate to contact me if I can be of assistance to you before our next scheduled telephone appointment.  Following are the goals we discussed today:   Goals Addressed             This Visit's Progress    Pharmacy Goals       Our goal bad cholesterol, or LDL, is less than 70. This is why it is important to continue taking your atorvastatin  Please check your home blood pressure and weights, keep a log of the results and bring this with you to your medical appointments.  Feel free to call me with any questions or concerns. I look forward to our next call!   Wallace Cullens, PharmD, [place] [phone]         Our next appointment is by telephone on 05/10/2022 at 10:30 am   Please call the care guide team at [phone] if you need to cancel or reschedule your appointment.    The patient verbalized understanding of instructions, educational materials, and care plan provided today and DECLINED offer to receive copy of patient instructions, educational materials, and care plan.

## 2022-02-06 NOTE — Chronic Care Management (AMB) (Signed)
Chronic Care Management CCM Pharmacy Note  02/06/2022 Name:  Beth [name] MRN:  [id number] DOB:  25-Jul-1945   Subjective: Beth [name] is an 77 y.o. year old female who is a primary patient of [name], DO.  The CCM team was consulted for assistance with disease management and care coordination needs.    Engaged with patient by telephone for follow up visit for pharmacy case management and/or care coordination services.   Objective:  Medications Reviewed Today     Reviewed by [name], DO (Physician) on 02/04/22 at 1141  Med List Status: <None>   Medication Order Taking? Sig Documenting Provider Last Dose Status Informant  acetaminophen (TYLENOL) 500 MG tablet [id number] No Take 500 mg by mouth 2 (two) times daily as needed. [provider] Taking Active   albuterol (VENTOLIN HFA) 108 (90 Base) MCG/ACT inhaler [id number] No Inhale into the lungs every 6 (six) hours as needed for wheezing or shortness of breath. [provider] Taking Active   aspirin 81 MG chewable tablet [id number] No Chew 81 mg by mouth daily. [provider] Taking Active Pharmacy Records           Med Note Dell Ponto Dec 18, 2016  4:14 PM)    atorvastatin (LIPITOR) 40 MG tablet [id number] No TAKE 1 TABLET BY MOUTH EVERY DAY [name], DO Taking Active   FLUoxetine (PROZAC) 20 MG capsule [id number] No TAKE 3 CAPSULES(60 MG) BY MOUTH DAILY Parks Ranger, [name], DO Taking Active   furosemide (LASIX) 20 MG tablet [id number] No Take 2 tablets (40 mg total) by mouth daily. If swelling is less, can take only 1 pill. [name], DO Taking Active   gabapentin (NEURONTIN) 300 MG capsule [id number] No Take 1 capsule (300 mg total) by mouth 3 (three) times daily. [name], MD Taking Active   gabapentin (NEURONTIN) 300 MG capsule [id number] No Take 3 capsules (900 mg total) by mouth 3 (three) times daily. [name], MD  Taking Active   LORazepam (ATIVAN) 0.5 MG tablet [id number] No Take 1 tablet (0.5 mg total) by mouth every other day as needed (for severe anxiety). Norman Clay, MD Taking Active   Melatonin 3 MG CAPS [id number] No Take 10 mg by mouth at bedtime as needed (sleep).  [provider] Taking Active Self  metoprolol succinate (TOPROL-XL) 25 MG 24 hr tablet [id number] No TAKE 1/2 TABLET(12.5 MG) BY MOUTH DAILY Parks Ranger, [name], DO Taking Active   montelukast (SINGULAIR) 10 MG tablet [id number] No TAKE 1 TABLET(10 MG) BY MOUTH AT BEDTIME [name], DO Taking Active   multivitamin-iron-minerals-folic acid (CENTRUM) chewable tablet [id number] No Chew 1 tablet by mouth daily. [provider] Taking Active   potassium chloride (KLOR-CON) 10 MEQ tablet [id number] No Take 1 tablet (10 mEq total) by mouth daily. [name], DO Taking Active   QUEtiapine (SEROQUEL) 200 MG tablet [id number] No Take 1 tablet (200 mg total) by mouth at bedtime. Take total of 250 mg. Take along with 50 mg tab Norman Clay, MD Taking Active   QUEtiapine (SEROQUEL) 50 MG tablet [id number] No Take 1 tablet (50 mg total) by mouth at bedtime. Take total of 250 mg. Take along with 200 mg tab Norman Clay, MD Taking Active   [facility] ELLIPTA 200-62.5-25 MCG/ACT AEPB [id number]  INHALE 1 PUFF INTO THE LUNGS DAILY [name], MD  Active   Med List Note Rise Patience, RN 02/12/18 1055): Medication  agreement signed 02/12/2018 UDS 02/24/17 03/25/17 PA requested for gabapentin sent SB             Pertinent Labs:  Lab Results  Component Value Date   HGBA1C 5.3 06/26/2017   Lab Results  Component Value Date   CHOL 135 04/10/2018   HDL 59 04/10/2018   LDLCALC 64 04/10/2018   TRIG 50 04/10/2018   CHOLHDL 2.3 04/10/2018   Lab Results  Component Value Date   CREATININE 0.99 09/21/2021   BUN 10 09/21/2021   NA 142 09/21/2021   K 3.6 09/21/2021   CL 103 09/21/2021   CO2 24  09/21/2021   BP Readings from Last 3 Encounters:  01/01/22 138/80  11/29/21 128/74  09/21/21 137/64   Pulse Readings from Last 3 Encounters:  01/01/22 68  11/29/21 62  09/21/21 95     SDOH:  (Social Determinants of Health) assessments and interventions performed:  SDOH Interventions    Flowsheet Row Chronic Care Management from 02/01/2022 in [facility] Counselor from 01/16/2022 in confidential department Office Visit from 11/29/2021 in confidential department Chronic Care Management from 11/20/2021 in [place] from 10/05/2021 in [facility] Coordination from 09/17/2021 in [place] Coordination  SDOH Interventions        Food Insecurity Interventions Intervention Not Indicated -- -- -- Intervention Not Indicated Intervention Not Indicated  Housing Interventions Intervention Not Indicated -- -- Intervention Not Indicated Intervention Not Indicated Intervention Not Indicated  Transportation Interventions Intervention Not Indicated -- -- -- Intervention Not Indicated Intervention Not Indicated  Utilities Interventions Intervention Not Indicated -- -- -- Intervention Not Indicated --  Alcohol Usage Interventions Intervention Not Indicated (Score <7) -- -- -- Intervention Not Indicated (Score <7) --  Depression Interventions/Treatment  -- Counseling Medication -- -- --  Financial Strain Interventions Intervention Not Indicated -- -- -- Intervention Not Indicated --  Physical Activity Interventions Intervention Not Indicated -- -- -- Intervention Not Indicated --  Stress Interventions Intervention Not Indicated -- -- -- Intervention Not Indicated --  Social Connections Interventions Intervention Not Indicated -- -- -- Intervention Not Indicated --       CCM Care Plan  Review of patient past medical history, allergies, medications, health status, including review of consultants reports,  laboratory and other test data, was performed as part of comprehensive evaluation and provision of chronic care management services.   Care Plan : PharmD - Med Mgmt  Updates made by Rennis Petty, RPH-CPP since 02/06/2022 12:00 AM     Problem: Disease Progression      Long-Range Goal: Disease Progression Prevented or Minimized   Start Date: 04/10/2020  Expected End Date: 07/09/2020  Recent Progress: On track  Priority: High  Note:   Current Barriers:  Chronic Disease Management support, education, and care coordination needs related to previous CVA, COPD, hypertension, hyperlipidemia, depression  Pharmacist Clinical Goal(s):  Over the next 90 days, patient will achieve adherence to monitoring guidelines and medication adherence to achieve therapeutic efficacy through collaboration with PharmD and provider.   Interventions: 1:1 collaboration with [name], DO regarding development and update of comprehensive plan of care as evidenced by provider attestation and co-signature Inter-disciplinary care team collaboration (see longitudinal plan of care) Perform chart review. Patient seen Video Visit with PCP on 02/04/2022 related to acute COPD exacerbation. Provider advised: Treat underlying AECOPD - start Prednisone taper and Start Azithromycin Z pak (antibiotic) 2 tabs day 1, then  1 tab x 4 days, complete entire course even if improved Use albuterol q 4 hr regularly x 2-3 days. Continue maintenance inhalers Continue Trelegy Today patient reports continues to feel sick, but starting to feel better. Confirms taking courses of prednisone and azithromycin as prescribed by PCP Review patient importance of using caution for risk of dizziness/sedation when taking doses of lorazepam Encourage patient to sit or lay down after taking lorazepam Have encouraged patient to use walker device consistently to aid with fall prevention Encourage patient to follow up regarding the scheduling  of echocardiogram as ordered by Pulmonologist on 12/5  Moderate to Severe COPD:  Patient followed by [facility] in [place] Current treatment: Trelegy 1 puff daily , rinse after use Patient confirms rinsing after use.  Albuterol inhaler as needed Overnight oxygen as directed by Pulmonology Review with patient the importance of consistently using overnight oxygen as directed Identify patient in need of albuterol inhaler refill, but current Rx out of refills CPP will send renewal of albuterol inhaler Rx to pharmacy for patient  Hypertension Reports taking: Furosemide 20 mg - 2 tablets (40 mg) daily Potassium chloride 10 mEq daily Metoprolol ER 25 mg - 1/2 tablet (12.5 mg ) daily Denies monitoring home blood pressure recently Encourage patient to restart monitoring home blood pressure, and keep log of BP and bring record to medical appointments Encourage patient to continue monitoring home weight and bring this record to medical appointments Have reminded patient to follow up with provider if has a weight change greater than established parameters   Patient Goals/Self-Care Activities Over the next 90 days, patient will:  To self administers medications as prescribed Encourage patient to obtain weekly pillbox to use as adherence tool Attends all scheduled provider appointments Calls pharmacy for medication refills Calls provider office for new concerns or questions  Follow Up Plan: CM Pharmacist will outreach to patient by telephone next on 05/10/2022 at 10:30 am       Wallace Cullens, PharmD, [place], [facility] [place] [phone]

## 2022-02-07 ENCOUNTER — Other Ambulatory Visit: Payer: Self-pay | Admitting: Pulmonary Disease

## 2022-02-07 ENCOUNTER — Other Ambulatory Visit: Payer: Self-pay | Admitting: Family Medicine

## 2022-02-07 DIAGNOSIS — F332 Major depressive disorder, recurrent severe without psychotic features: Secondary | ICD-10-CM

## 2022-02-07 NOTE — Telephone Encounter (Signed)
Requested Prescriptions  Pending Prescriptions Disp Refills   FLUoxetine (PROZAC) 20 MG capsule [Pharmacy Med Name: FLUOXETINE '20MG'$  CAPSULES] 270 capsule 0    Sig: TAKE 3 CAPSULES(60 MG) BY MOUTH DAILY     Psychiatry:  Antidepressants - SSRI Passed - 02/07/2022 10:03 AM      Passed - Completed PHQ-2 or PHQ-9 in the last 360 days      Passed - Valid encounter within last 6 months    Recent Outpatient Visits           3 days ago Acute bronchitis with COPD [facility])   [place], DO   3 months ago COPD, severe [facility])   [place], Virl Diamond, RPH-CPP   4 months ago Nausea   [facility] Mecum, [place] E, [place]   8 months ago Recurrent falls   [place], DO   11 months ago Chronic diastolic congestive heart failure [facility])   [facility] [place], [name], DO

## 2022-02-16 ENCOUNTER — Other Ambulatory Visit: Payer: Self-pay | Admitting: Family Medicine

## 2022-02-16 DIAGNOSIS — I10 Essential (primary) hypertension: Secondary | ICD-10-CM

## 2022-02-18 ENCOUNTER — Ambulatory Visit: Payer: Medicare Other | Admitting: Licensed Clinical Social Worker

## 2022-02-18 NOTE — Telephone Encounter (Signed)
Requested Prescriptions  Pending Prescriptions Disp Refills   metoprolol succinate (TOPROL-XL) 25 MG 24 hr tablet [Pharmacy Med Name: METOPROLOL ER SUCCINATE '25MG'$  TABS] 45 tablet 1    Sig: TAKE 1/2 TABLET(12.5 MG) BY MOUTH DAILY     Cardiovascular:  Beta Blockers Passed - 02/16/2022 10:02 AM      Passed - Last BP in normal range    BP Readings from Last 1 Encounters:  01/01/22 138/80         Passed - Last Heart Rate in normal range    Pulse Readings from Last 1 Encounters:  01/01/22 68         Passed - Valid encounter within last 6 months    Recent Outpatient Visits           2 weeks ago Acute bronchitis with COPD [facility])   [place], DO   3 months ago COPD, severe [facility]   [place], [place] Ormond A, RPH-CPP   5 months ago Nausea   [facility] Mecum, [place] E, [place]   9 months ago Recurrent falls   [place], DO   12 months ago Chronic diastolic congestive heart failure [facility])   [place], [place]

## 2022-02-26 DIAGNOSIS — J441 Chronic obstructive pulmonary disease with (acute) exacerbation: Secondary | ICD-10-CM | POA: Diagnosis not present

## 2022-02-27 DIAGNOSIS — I10 Essential (primary) hypertension: Secondary | ICD-10-CM

## 2022-02-27 DIAGNOSIS — F332 Major depressive disorder, recurrent severe without psychotic features: Secondary | ICD-10-CM

## 2022-02-27 DIAGNOSIS — J432 Centrilobular emphysema: Secondary | ICD-10-CM

## 2022-02-27 DIAGNOSIS — I5032 Chronic diastolic (congestive) heart failure: Secondary | ICD-10-CM

## 2022-03-05 ENCOUNTER — Other Ambulatory Visit: Payer: Self-pay | Admitting: Psychiatry
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
## 2022-03-07 ENCOUNTER — Ambulatory Visit (INDEPENDENT_AMBULATORY_CARE_PROVIDER_SITE_OTHER): Payer: 59 | Admitting: Psychiatry

## 2022-03-07 ENCOUNTER — Encounter: Payer: Self-pay | Admitting: Psychiatry

## 2022-03-07 VITALS — BP 147/75 | HR 80 | Temp 97.1°F | Ht 65.0 in | Wt 146.0 lb

## 2022-03-07 DIAGNOSIS — F330 Major depressive disorder, recurrent, mild: Secondary | ICD-10-CM | POA: Diagnosis not present

## 2022-03-07 DIAGNOSIS — F411 Generalized anxiety disorder: Secondary | ICD-10-CM | POA: Diagnosis not present

## 2022-03-07 DIAGNOSIS — F4310 Post-traumatic stress disorder, unspecified: Secondary | ICD-10-CM | POA: Diagnosis not present

## 2022-03-07 MED ORDER — LORAZEPAM 0.5 MG PO TABS: 0.5000 mg | ORAL_TABLET | ORAL | 1 refills | Status: DC

## 2022-03-07 MED ORDER — QUETIAPINE FUMARATE 200 MG PO TABS: 200.0000 mg | ORAL_TABLET | Freq: Every day | ORAL | 0 refills | Status: DC

## 2022-03-07 NOTE — Patient Instructions (Signed)
Continue fluoxetine 60 mg daily Continue quetiapine 250 mg at night Continue lorazepam 0.5 mg every other day as needed for extreme anxiety  Obtain EKG  Next appointment- 4/9 at 3:30

## 2022-03-11 ENCOUNTER — Ambulatory Visit: Payer: 59 | Attending: Acute Care

## 2022-03-12 ENCOUNTER — Other Ambulatory Visit: Payer: Self-pay | Admitting: Family Medicine

## 2022-03-12 DIAGNOSIS — J302 Other seasonal allergic rhinitis: Secondary | ICD-10-CM

## 2022-03-13 NOTE — Telephone Encounter (Signed)
Requested Prescriptions  Pending Prescriptions Disp Refills   montelukast (SINGULAIR) 10 MG tablet [Pharmacy Med Name: MONTELUKAST 10MG TABLETS] 90 tablet 1    Sig: TAKE 1 TABLET(10 MG) BY MOUTH AT BEDTIME     Pulmonology:  Leukotriene Inhibitors Passed - 03/12/2022 10:02 AM      Passed - Valid encounter within last 12 months    Recent Outpatient Visits           1 month ago Acute bronchitis with COPD [facility])   [place], DO   4 months ago COPD, severe [facility])   [place], [place]ond A, RPH-CPP   5 months ago Nausea   [facility] [place], [place] E, [place]   10 months ago Recurrent falls   [place], DO   1 year ago Chronic diastolic congestive heart failure [facility])   [place], [place]

## 2022-03-15 ENCOUNTER — Ambulatory Visit (INDEPENDENT_AMBULATORY_CARE_PROVIDER_SITE_OTHER): Payer: 59 | Admitting: Family Medicine

## 2022-03-15 ENCOUNTER — Encounter: Payer: Self-pay | Admitting: Family Medicine

## 2022-03-15 ENCOUNTER — Ambulatory Visit: Payer: Self-pay

## 2022-03-15 VITALS — BP 122/70 | HR 84 | Ht 65.0 in | Wt 150.6 lb

## 2022-03-15 DIAGNOSIS — G8929 Other chronic pain: Secondary | ICD-10-CM | POA: Diagnosis not present

## 2022-03-15 DIAGNOSIS — M5441 Lumbago with sciatica, right side: Secondary | ICD-10-CM | POA: Diagnosis not present

## 2022-03-15 MED ORDER — BACLOFEN 10 MG PO TABS: 5.0000 mg | ORAL_TABLET | Freq: Three times a day (TID) | ORAL | 1 refills | Status: DC | PRN

## 2022-03-15 MED ORDER — PREDNISONE 20 MG PO TABS: ORAL_TABLET | ORAL | 0 refills | Status: DC

## 2022-03-15 NOTE — Patient Instructions (Addendum)
Thank you for coming to the office today.  Monday 2/19 for X-rays of Low Back and Sacrum/Tailbone - 8am - 1130am or 115 to 4pm Mon-Thurs, walk in , no apt needed  1. For your Back Pain - I think that this is due to Muscle Spasms or strain. Your Sciatic Nerve can be affected causing some of your radiation and numbness down your legs.  Start Prednisone taper (steroid anti-inflammatory) for nerve irritation with pain in legs  3. Start Baclofen (Lioresal) 45m tablets - cut in half for 548mat night for muscle relaxant - may make you sedated or sleepy (be careful driving or working on this) if tolerated you can take every 8 hours, half or whole tab  4. May use Tylenol Extra Str 50010mabs - may take 1-2 tablets every 6 hours as needed  5. Recommend to start using heating pad on your lower back 1-2x daily for few weeks  Also try a Wedge Seat Cushion to avoid nerve pinching when sitting prolonged period of time.  This pain may take weeks to months to fully resolve, but hopefully it will respond to the medicine initially. All back injuries (small or serious) are slow to heal since we use our back muscles every day. Be careful with turning, twisting, lifting, sitting / standing for prolonged periods, and avoid re-injury.  If your symptoms significantly worsen with more pain, or new symptoms with weakness in one or both legs, new or different shooting leg pains, numbness in legs or groin, loss of control or retention of urine or bowel movements, please call back for advice and you may need to go directly to the Emergency Department.    Please schedule a Follow-up Appointment to: Return if symptoms worsen or fail to improve.  If you have any other questions or concerns, please feel free to call the office or send a message through [facility] may also schedule an earlier appointment if necessary.  Additionally, you may be receiving a survey about your experience at our office within a few days to 1  week by e-mail or mail. We value your feedback.  AleNobie PutnamO [place]

## 2022-03-15 NOTE — Progress Notes (Signed)
Subjective:    Patient ID: Beth [name], female    DOB: 05-06-1945, 77 y.o.   MRN: [id number]  Beth [name] is a 77 y.o. female presenting on 03/15/2022 for Back Pain and Sciatica  Patient presents for a same day appointment.  HPI  Right sided Low Back / Sciatica pain Fall injury 1 month ago Gradual worsening with R sided back pain and tailbone bone, has radiating symptoms down lower extremity. Taking OTC Tylenol Ext str 500-650 x 2 few times a day without relief Taking Gabapentin 955m THREE TIMES A DAY Denies any worsening numbness focal weakness      03/15/2022    3:21 PM 03/07/2022    3:10 PM 01/16/2022    4:06 PM  Depression screen PHQ 2/9  Decreased Interest 1    Down, Depressed, Hopeless 1    PHQ - 2 Score 2    Altered sleeping 0    Tired, decreased energy 2    Change in appetite 0    Feeling bad or failure about yourself  3    Trouble concentrating 1    Moving slowly or fidgety/restless 0    Suicidal thoughts 0    PHQ-9 Score 8    Difficult doing work/chores Not difficult at all       Information is confidential and restricted. Go to Review Flowsheets to unlock data.    Social History   Tobacco Use   Smoking status: Former    Packs/day: 0.25    Years: 57.00    Total pack years: 14.25    Types: Cigarettes    Quit date: 10/24/2020    Years since quitting: 1.3   Smokeless tobacco: Never   Tobacco comments:    6-8cig daily--01/01/2022  Vaping Use   Vaping Use: Never used  Substance Use Topics   Alcohol use: No    Alcohol/week: 0.0 standard drinks of alcohol   Drug use: No    Review of Systems Per HPI unless specifically indicated above     Objective:    BP 122/70   Pulse 84   Ht 5' 5"$  (1.651 m)   Wt 150 lb 9.6 oz (68.3 kg)   SpO2 93%   BMI 25.06 kg/m   Wt Readings from Last 3 Encounters:  03/15/22 150 lb 9.6 oz (68.3 kg)  01/01/22 143 lb 9.6 oz (65.1 kg)  10/05/21 138 lb (62.6 kg)    Physical Exam Vitals and nursing note  reviewed.  Constitutional:      General: She is not in acute distress.    Appearance: Normal appearance. She is well-developed. She is not diaphoretic.     Comments: Well-appearing, comfortable, cooperative  HENT:     Head: Normocephalic and atraumatic.  Eyes:     General:        Right eye: No discharge.        Left eye: No discharge.     Conjunctiva/sclera: Conjunctivae normal.  Cardiovascular:     Rate and Rhythm: Normal rate.  Pulmonary:     Effort: Pulmonary effort is normal.  Musculoskeletal:     Comments: Low Back Inspection: Normal appearance, normal body habitus, no spinal deformity, symmetrical. Palpation: No tenderness over spinous processes. Mild tender with muscle spasm and hypertonicity R>L low back and sacroiliac region. ROM: Full active ROM forward flex / back extension, rotation L/R without discomfort Special Testing: Seated SLR negative for radicular pain bilaterally but some mild pain R side straining pain or spasm. Strength: Bilateral hip  flex/ext 5/5, knee flex/ext 5/5, ankle dorsiflex/plantarflex 5/5 Neurovascular: intact distal sensation to light touch   Skin:    General: Skin is warm and dry.     Findings: No erythema or rash.  Neurological:     Mental Status: She is alert and oriented to person, place, and time.  Psychiatric:        Mood and Affect: Mood normal.        Behavior: Behavior normal.        Thought Content: Thought content normal.     Comments: Well groomed, good eye contact, normal speech and thoughts      Results for orders placed or performed in visit on 09/21/21  Urine Culture   Specimen: Urine  Result Value Ref Range   MICRO NUMBER: [id number]    SPECIMEN QUALITY: Adequate    Sample Source URINE    STATUS: FINAL    Result:      Mixed genital flora isolated. These superficial bacteria are not indicative of a urinary tract infection. No further organism identification is warranted on this specimen. If clinically indicated, recollect  clean-catch, mid-stream urine and transfer  immediately to Urine Culture Transport Tube.   COMPLETE METABOLIC PANEL WITH GFR  Result Value Ref Range   Glucose, Bld 96 65 - 99 mg/dL   BUN 10 7 - 25 mg/dL   Creat 0.99 0.60 - 1.00 mg/dL   eGFR 59 (L) > OR = 60 mL/min/1.15m   BUN/Creatinine Ratio SEE NOTE: 6 - 22 (calc)   Sodium 142 135 - 146 mmol/L   Potassium 3.6 3.5 - 5.3 mmol/L   Chloride 103 98 - 110 mmol/L   CO2 24 20 - 32 mmol/L   Calcium 9.4 8.6 - 10.4 mg/dL   Total Protein 6.9 6.1 - 8.1 g/dL   Albumin 3.8 3.6 - 5.1 g/dL   Globulin 3.1 1.9 - 3.7 g/dL (calc)   AG Ratio 1.2 1.0 - 2.5 (calc)   Total Bilirubin 0.5 0.2 - 1.2 mg/dL   Alkaline phosphatase (APISO) 109 37 - 153 U/L   AST 93 (H) 10 - 35 U/L   ALT 87 (H) 6 - 29 U/L  CBC w/Diff/Platelet  Result Value Ref Range   WBC 11.6 (H) 3.8 - 10.8 Thousand/uL   RBC 4.18 3.80 - 5.10 Million/uL   Hemoglobin 13.0 11.7 - 15.5 g/dL   HCT 39.4 35.0 - 45.0 %   MCV 94.3 80.0 - 100.0 fL   MCH 31.1 27.0 - 33.0 pg   MCHC 33.0 32.0 - 36.0 g/dL   RDW 13.1 11.0 - 15.0 %   Platelets 270 140 - 400 Thousand/uL   MPV 10.4 7.5 - 12.5 fL   Neutro Abs 8,480 (H) 1,500 - 7,800 cells/uL   Lymphs Abs 2,274 850 - 3,900 cells/uL   Absolute Monocytes 742 200 - 950 cells/uL   Eosinophils Absolute 70 15 - 500 cells/uL   Basophils Absolute 35 0 - 200 cells/uL   Neutrophils Relative % 73.1 %   Total Lymphocyte 19.6 %   Monocytes Relative 6.4 %   Eosinophils Relative 0.6 %   Basophils Relative 0.3 %  POCT Urinalysis Dipstick  Result Value Ref Range   Color, UA Yellow    Clarity, UA Cloudy    Glucose, UA Negative Negative   Bilirubin, UA Moderate ++    Ketones, UA Negative    Spec Grav, UA 1.015 1.010 - 1.025   Blood, UA Large +++    pH, UA 5.0 5.0 -  8.0   Protein, UA Positive (A) Negative   Urobilinogen, UA 0.2 0.2 or 1.0 E.U./dL   Nitrite, UA Negative    Leukocytes, UA Large (3+) (A) Negative   Appearance     Odor        Assessment &  Plan:   Problem List Items Addressed This Visit   None Visit Diagnoses     Chronic right-sided low back pain with right-sided sciatica    -  Primary   Relevant Medications   predniSONE (DELTASONE) 20 MG tablet   baclofen (LIORESAL) 10 MG tablet   Other Relevant Orders   DG Lumbar Spine Complete   DG Sacrum/Coccyx       Subacute on chronic R LBP with associated R sciatica. Suspect likely due to muscle spasm/strain, without known injury or trauma. In setting of known chronic LBP with DJD - No red flag symptoms. Negative SLR for radiculopathy  Plan: 1. Due for X-rays after fall - check Lumbar Spine + Sacrum will return on Monday next week walk in  2. Prednisone taper 3. Baclofen muscle relaxant course 4. May use Tylenol PRN for breakthrough 5. Encouraged use of heating pad 1-2x daily for now then PRN  Follow-up 4-6 weeks if not improved for re-evaluation, consider X-ray imaging, trial of PT, and possibly referral to Orthopedic   Orders Placed This Encounter  Procedures   DG Lumbar Spine Complete    Standing Status:   Future    Standing Expiration Date:   03/16/2023    Order Specific Question:   Reason for Exam (SYMPTOM  OR DIAGNOSIS REQUIRED)    Answer:   fall injury 1 month ago, right sided low back pain sciatica    Order Specific Question:   Preferred imaging location?    Answer:   [facility] Sacrum/Coccyx    Standing Status:   Future    Standing Expiration Date:   03/16/2023    Order Specific Question:   Reason for Exam (SYMPTOM  OR DIAGNOSIS REQUIRED)    Answer:   fall injury 1 month ago, persistent R sided pain and sciatica    Order Specific Question:   Preferred imaging location?    Answer:   [facility]     Meds ordered this encounter  Medications   predniSONE (DELTASONE) 20 MG tablet    Sig: Take daily with food. Start with 27m (3 pills) x 2 days, then reduce to 424m(2 pills) x 2 days, then 2013m1 pill) x 3 days    Dispense:  13 tablet     Refill:  0   baclofen (LIORESAL) 10 MG tablet    Sig: Take 0.5-1 tablets (5-10 mg total) by mouth 3 (three) times daily as needed for muscle spasms.    Dispense:  30 each    Refill:  1      Follow up plan: Return if symptoms worsen or fail to improve.    AleNobie PutnamO [facility] 03/15/2022, 3:48 PM

## 2022-03-15 NOTE — Telephone Encounter (Signed)
  Chief Complaint: left hip pain and mid lower back pain  Symptoms: weakness and shakiness- pt stated she uses walker due to feeling off balance Frequency: fell last month Pertinent Negatives: Patient denies fever Disposition: []$ ED /[]$ Urgent Care (no appt availability in office) / [x]$ Appointment(In office/virtual)/ []$  [facility]/ []$ Home Care/ []$ Refused Recommended Disposition /[]$ [facility]/ []$  Follow-up with PCP Additional Notes: pt was lost her balance a fell 1 month ago Reason for Disposition  [1] MODERATE weakness (i.e., interferes with work, school, normal activities) AND [2] new-onset or worsening  Answer Assessment - Initial Assessment Questions 1. MECHANISM: "How did the fall happen?"     Lost balance 2. DOMESTIC VIOLENCE AND ELDER ABUSE SCREENING: "Did you fall because someone pushed you or tried to hurt you?" If Yes, ask: "Are you safe now?"     *No Answer* 3. ONSET: "When did the fall happen?" (e.g., minutes, hours, or days ago)     Last month 4. LOCATION: "What part of the body hit the ground?" (e.g., back, buttocks, head, hips, knees, hands, head, stomach)     Lower back and hip- left  5. INJURY: "Did you hurt (injure) yourself when you fell?" If Yes, ask: "What did you injure? Tell me more about this?" (e.g., body area; type of injury; pain severity)"     Pain-bruises 6. PAIN: "Is there any pain?" If Yes, ask: "How bad is the pain?" (e.g., Scale 1-10; or mild,  moderate, severe)   - NONE (0): No pain   - MILD (1-3): Doesn't interfere with normal activities    - MODERATE (4-7): Interferes with normal activities or awakens from sleep    - SEVERE (8-10): Excruciating pain, unable to do any normal activities      severe  9. OTHER SYMPTOMS: "Do you have any other symptoms?" (e.g., dizziness, fever, weakness; new onset or worsening).      Weakness and shakiness- 1 month 10. CAUSE: "What do you think caused the fall (or falling)?" (e.g., tripped, dizzy  spell)       Off ballance  Protocols used: Falls and [facility]

## 2022-03-18 ENCOUNTER — Ambulatory Visit
Admission: RE | Admit: 2022-03-18 | Discharge: 2022-03-18 | Disposition: A | Discharge status: Home / Self Care | Payer: 59 | Source: Ambulatory Visit | Attending: Family Medicine | Admitting: Family Medicine

## 2022-03-18 ENCOUNTER — Ambulatory Visit
Admission: RE | Admit: 2022-03-18 | Discharge: 2022-03-18 | Disposition: A | Discharge status: Home / Self Care | Payer: 59 | Attending: Family Medicine | Admitting: Family Medicine

## 2022-03-18 DIAGNOSIS — M5441 Lumbago with sciatica, right side: Secondary | ICD-10-CM | POA: Diagnosis not present

## 2022-03-18 DIAGNOSIS — G8929 Other chronic pain: Secondary | ICD-10-CM

## 2022-03-18 DIAGNOSIS — M5430 Sciatica, unspecified side: Secondary | ICD-10-CM | POA: Diagnosis not present

## 2022-03-18 DIAGNOSIS — M545 Low back pain, unspecified: Secondary | ICD-10-CM | POA: Diagnosis not present

## 2022-03-22 ENCOUNTER — Ambulatory Visit: Payer: Self-pay | Admitting: *Deleted

## 2022-03-22 NOTE — Telephone Encounter (Signed)
  Chief Complaint: Called in and was given her x ray result message from Dr. Parks Ranger. Symptoms:  Frequency:  Pertinent Negatives: Patient denies  Disposition: []$ ED /[]$ Urgent Care (no appt availability in office) / []$ Appointment(In office/virtual)/ []$  [facility]/ []$ Home Care/ []$ Refused Recommended Disposition /[]$ [facility]/ [x]$  Follow-up with PCP Additional Notes: X ray results given

## 2022-03-22 NOTE — Telephone Encounter (Signed)
Pt returned call and was given her x ray result message from Dr. Parks Ranger dated 03/20/2022 at 6:08 PM.  No questions from pt.    Reason for Disposition  [1] Follow-up call to recent contact AND [2] information only call, no triage required  Answer Assessment - Initial Assessment Questions 1. REASON FOR CALL or QUESTION: "What is your reason for calling today?" or "How can I best help you?" or "What question do you have that I can help answer?"     Pt returned call and was given her x ray results from Dr. Parks Ranger.  Protocols used: Information Only Call - No Triage-A-AH

## 2022-03-23 ENCOUNTER — Observation Stay
Admission: EM | Admit: 2022-03-23 | Discharge: 2022-03-25 | Disposition: A | Discharge status: Home / Self Care | Payer: 59 | Attending: Internal Medicine | Admitting: Internal Medicine

## 2022-03-23 DIAGNOSIS — Z87891 Personal history of nicotine dependence: Secondary | ICD-10-CM | POA: Insufficient documentation

## 2022-03-23 DIAGNOSIS — R296 Repeated falls: Secondary | ICD-10-CM

## 2022-03-23 DIAGNOSIS — N1831 Chronic kidney disease, stage 3a: Secondary | ICD-10-CM | POA: Insufficient documentation

## 2022-03-23 DIAGNOSIS — I340 Nonrheumatic mitral (valve) insufficiency: Secondary | ICD-10-CM | POA: Insufficient documentation

## 2022-03-23 DIAGNOSIS — W19XXXA Unspecified fall, initial encounter: Secondary | ICD-10-CM | POA: Diagnosis not present

## 2022-03-23 DIAGNOSIS — I272 Pulmonary hypertension, unspecified: Secondary | ICD-10-CM | POA: Insufficient documentation

## 2022-03-23 DIAGNOSIS — Z8673 Personal history of transient ischemic attack (TIA), and cerebral infarction without residual deficits: Secondary | ICD-10-CM | POA: Insufficient documentation

## 2022-03-23 DIAGNOSIS — Z743 Need for continuous supervision: Secondary | ICD-10-CM | POA: Diagnosis not present

## 2022-03-23 DIAGNOSIS — J45909 Unspecified asthma, uncomplicated: Secondary | ICD-10-CM | POA: Insufficient documentation

## 2022-03-23 DIAGNOSIS — G459 Transient cerebral ischemic attack, unspecified: Secondary | ICD-10-CM

## 2022-03-23 DIAGNOSIS — R0602 Shortness of breath: Secondary | ICD-10-CM | POA: Insufficient documentation

## 2022-03-23 DIAGNOSIS — R202 Paresthesia of skin: Secondary | ICD-10-CM | POA: Diagnosis not present

## 2022-03-23 DIAGNOSIS — J449 Chronic obstructive pulmonary disease, unspecified: Secondary | ICD-10-CM | POA: Insufficient documentation

## 2022-03-23 DIAGNOSIS — J4489 Other specified chronic obstructive pulmonary disease: Secondary | ICD-10-CM | POA: Insufficient documentation

## 2022-03-23 DIAGNOSIS — Z79899 Other long term (current) drug therapy: Secondary | ICD-10-CM | POA: Diagnosis not present

## 2022-03-23 DIAGNOSIS — R45851 Suicidal ideations: Secondary | ICD-10-CM | POA: Insufficient documentation

## 2022-03-23 DIAGNOSIS — I129 Hypertensive chronic kidney disease with stage 1 through stage 4 chronic kidney disease, or unspecified chronic kidney disease: Secondary | ICD-10-CM | POA: Diagnosis not present

## 2022-03-23 DIAGNOSIS — R200 Anesthesia of skin: Secondary | ICD-10-CM | POA: Diagnosis not present

## 2022-03-23 DIAGNOSIS — R609 Edema, unspecified: Secondary | ICD-10-CM | POA: Diagnosis not present

## 2022-03-23 DIAGNOSIS — Z7982 Long term (current) use of aspirin: Secondary | ICD-10-CM | POA: Insufficient documentation

## 2022-03-23 DIAGNOSIS — R29818 Other symptoms and signs involving the nervous system: Secondary | ICD-10-CM | POA: Diagnosis not present

## 2022-03-23 DIAGNOSIS — T1490XA Injury, unspecified, initial encounter: Secondary | ICD-10-CM | POA: Diagnosis not present

## 2022-03-23 DIAGNOSIS — E785 Hyperlipidemia, unspecified: Secondary | ICD-10-CM | POA: Diagnosis present

## 2022-03-23 DIAGNOSIS — I350 Nonrheumatic aortic (valve) stenosis: Secondary | ICD-10-CM

## 2022-03-23 DIAGNOSIS — R531 Weakness: Principal | ICD-10-CM

## 2022-03-23 DIAGNOSIS — I2721 Secondary pulmonary arterial hypertension: Secondary | ICD-10-CM | POA: Insufficient documentation

## 2022-03-23 DIAGNOSIS — F419 Anxiety disorder, unspecified: Secondary | ICD-10-CM | POA: Insufficient documentation

## 2022-03-23 DIAGNOSIS — M545 Low back pain, unspecified: Secondary | ICD-10-CM | POA: Diagnosis not present

## 2022-03-23 DIAGNOSIS — F32A Depression, unspecified: Secondary | ICD-10-CM | POA: Insufficient documentation

## 2022-03-23 DIAGNOSIS — I071 Rheumatic tricuspid insufficiency: Secondary | ICD-10-CM | POA: Insufficient documentation

## 2022-03-23 DIAGNOSIS — Z1152 Encounter for screening for COVID-19: Secondary | ICD-10-CM | POA: Insufficient documentation

## 2022-03-23 DIAGNOSIS — Z043 Encounter for examination and observation following other accident: Secondary | ICD-10-CM | POA: Diagnosis not present

## 2022-03-23 DIAGNOSIS — R0600 Dyspnea, unspecified: Secondary | ICD-10-CM | POA: Diagnosis not present

## 2022-03-23 DIAGNOSIS — R6889 Other general symptoms and signs: Secondary | ICD-10-CM | POA: Diagnosis not present

## 2022-03-23 LAB — CBC
HCT: 35 % — ABNORMAL LOW (ref 36.0–46.0)
Hemoglobin: 11.2 g/dL — ABNORMAL LOW (ref 12.0–15.0)
MCH: 30.4 pg (ref 26.0–34.0)
MCHC: 32 g/dL (ref 30.0–36.0)
MCV: 95.1 fL (ref 80.0–100.0)
Platelets: 219 10*3/uL (ref 150–400)
RBC: 3.68 MIL/uL — ABNORMAL LOW (ref 3.87–5.11)
RDW: 15.5 % (ref 11.5–15.5)
WBC: 12.9 10*3/uL — ABNORMAL HIGH (ref 4.0–10.5)
nRBC: 0 % (ref 0.0–0.2)

## 2022-03-23 LAB — DIFFERENTIAL
Abs Immature Granulocytes: 0.14 10*3/uL — ABNORMAL HIGH (ref 0.00–0.07)
Basophils Absolute: 0 10*3/uL (ref 0.0–0.1)
Basophils Relative: 0 %
Eosinophils Absolute: 0.3 10*3/uL (ref 0.0–0.5)
Eosinophils Relative: 3 %
Immature Granulocytes: 1 %
Lymphocytes Relative: 13 %
Lymphs Abs: 1.7 10*3/uL (ref 0.7–4.0)
Monocytes Absolute: 1 10*3/uL (ref 0.1–1.0)
Monocytes Relative: 8 %
Neutro Abs: 9.6 10*3/uL — ABNORMAL HIGH (ref 1.7–7.7)
Neutrophils Relative %: 75 %

## 2022-03-23 MED ORDER — IPRATROPIUM-ALBUTEROL 0.5-2.5 (3) MG/3ML IN SOLN
3.0000 mL | Freq: Once | RESPIRATORY_TRACT | Status: AC
  Administered 2022-03-24: 3 mL via RESPIRATORY_TRACT
  Filled 2022-03-23: qty 3

## 2022-03-23 NOTE — ED Provider Notes (Signed)
[facility] Provider Note    Event Date/Time   First MD Initiated Contact with Patient 03/23/22 2342     (approximate)   History   Weakness (Increased weakness x 2 days. Had 3 falls today. States she is having decreased sensation on L side of her face and slight L sided facial droop) and Code Stroke   HPI  Beth [name] is a 77 y.o. female with history of COPD, previous stroke, hypertension, hyperlipidemia, chronic pain syndrome who presents to the emergency department EMS for concerns for left-sided weakness, shortness of breath and multiple falls.  Patient states her last known well was 2 days ago.  She feels like her left arm and leg have been weak and she has numbness in the arm and leg as well.  She is complaining of neck and diffuse back pain from her falls.  She lives at home alone.  Uses a walker at baseline.  Denies fevers, cough, chest pain, shortness of breath, vomiting, diarrhea, urinary symptoms.  She states she is on aspirin daily.  History provided by patient, EMS.    Past Medical History:  Diagnosis Date   Allergy    Anxiety    Asthma    Chronic pain syndrome    discharged from pain clinic, hx of narcotics seeking behavior   COPD (chronic obstructive pulmonary disease) ([place])    CVA (cerebral infarction)    Depression    Fibromyalgia    Headache    Hyperlipidemia    Hypertension    IBS (irritable bowel syndrome)    Stroke ([place])    Vitamin D deficiency     Past Surgical History:  Procedure Laterality Date   ABDOMINAL HYSTERECTOMY     APPENDECTOMY     BREAST EXCISIONAL BIOPSY  2011   Pt states lump removed, ? side, no scar seen   BREAST SURGERY  2011   biopsy   CERVICAL DISCECTOMY     CHOLECYSTECTOMY     SINUSOTOMY      MEDICATIONS:  Prior to Admission medications   Medication Sig Start Date End Date Taking? Authorizing Provider  acetaminophen (TYLENOL) 500 MG tablet Take 500 mg by mouth 2 (two) times daily as needed.     [provider]  albuterol (VENTOLIN HFA) 108 (90 Base) MCG/ACT inhaler Inhale 1-2 puffs into the lungs every 6 (six) hours as needed for wheezing or shortness of breath. 02/06/22   Karamalegos, [name], DO  aspirin 81 MG chewable tablet Chew 81 mg by mouth daily.    [provider]  atorvastatin (LIPITOR) 40 MG tablet TAKE 1 TABLET BY MOUTH EVERY DAY 12/26/21   Karamalegos, [name], DO  baclofen (LIORESAL) 10 MG tablet Take 0.5-1 tablets (5-10 mg total) by mouth 3 (three) times daily as needed for muscle spasms. 03/15/22   Karamalegos, [name], DO  FLUoxetine (PROZAC) 20 MG capsule TAKE 3 CAPSULES(60 MG) BY MOUTH DAILY 02/07/22   Parks Ranger, [name], DO  Fluticasone-Umeclidin-Vilant (TRELEGY ELLIPTA) 200-62.5-25 MCG/ACT AEPB INHALE 1 PUFF INTO THE LUNGS DAILY 02/07/22   [name], MD  furosemide (LASIX) 20 MG tablet Take 2 tablets (40 mg total) by mouth daily. If swelling is less, can take only 1 pill. 02/21/21   Karamalegos, [name], DO  gabapentin (NEURONTIN) 300 MG capsule Take 1 capsule (300 mg total) by mouth 3 (three) times daily. 10/30/21 04/28/22  [name], MD  gabapentin (NEURONTIN) 300 MG capsule Take 3 capsules (900 mg total) by  mouth 3 (three) times daily. 09/05/21 03/04/22  [name], MD  LORazepam (ATIVAN) 0.5 MG tablet Take 1 tablet (0.5 mg total) by mouth every other day. 03/29/22 05/28/22  Norman Clay, MD  Melatonin 3 MG CAPS Take 10 mg by mouth at bedtime as needed (sleep).     [provider]  metoprolol succinate (TOPROL-XL) 25 MG 24 hr tablet TAKE 1/2 TABLET(12.5 MG) BY MOUTH DAILY 02/18/22   Parks Ranger, [name], DO  montelukast (SINGULAIR) 10 MG tablet TAKE 1 TABLET(10 MG) BY MOUTH AT BEDTIME 03/13/22   Karamalegos, [name], DO  multivitamin-iron-minerals-folic acid (CENTRUM) chewable tablet Chew 1 tablet by mouth daily.    [provider]  potassium chloride (KLOR-CON) 10 MEQ tablet Take 1 tablet (10 mEq  total) by mouth daily. 02/21/21   Karamalegos, [name], DO  predniSONE (DELTASONE) 20 MG tablet Take daily with food. Start with '60mg'$  (3 pills) x 2 days, then reduce to '40mg'$  (2 pills) x 2 days, then '20mg'$  (1 pill) x 3 days 03/15/22   [name], DO  QUEtiapine (SEROQUEL) 200 MG tablet Take 1 tablet (200 mg total) by mouth at bedtime. Take total of 250 mg. Take along with 50 mg tab 03/19/22 06/17/22  Norman Clay, MD  QUEtiapine (SEROQUEL) 50 MG tablet Take 1 tablet (50 mg total) by mouth at bedtime. Total of 250 mg at night. Take along with 200 mg tab 03/06/22 06/04/22  Norman Clay, MD    Physical Exam   Triage Vital Signs: ED Triage Vitals  Enc Vitals Group     BP 03/23/22 2344 128/70     Pulse Rate 03/23/22 2344 73     Resp 03/23/22 2344 20     Temp 03/23/22 2344 98.2 F (36.8 C)     Temp Source 03/23/22 2344 Oral     SpO2 03/23/22 2347 96 %     Weight --      Height --      Head Circumference --      Peak Flow --      Pain Score 03/23/22 2343 5     Pain Loc --      Pain Edu? --      Excl. in [place]? --     Most recent vital signs: Vitals:   03/23/22 2347 03/24/22 0000  BP:  (!) 138/59  Pulse:  67  Resp:  18  Temp:    SpO2: 96% 97%    CONSTITUTIONAL: Alert, responds appropriately to questions.  Elderly HEAD: Normocephalic, atraumatic EYES: Conjunctivae clear, pupils appear equal, sclera nonicteric ENT: normal nose; moist mucous membranes NECK: Supple, normal ROM, tender to palpation over the cervical spine without step-off or deformity CARD: RRR; S1 and S2 appreciated RESP: Normal chest excursion without splinting or tachypnea; breath sounds clear and equal bilaterally; no wheezes, no rhonchi, no rales, no hypoxia or respiratory distress, speaking full sentences ABD/GI: Non-distended; soft, non-tender, no rebound, no guarding, no peritoneal signs BACK: The back appears normal, tender to palpation over the thoracic and lumbar spine without step-off or  deformity EXT: Normal ROM in all joints; no deformity noted, no edema SKIN: Normal color for age and race; warm; no rash on exposed skin NEURO: Patient appears to have a difficult time lifting any of her extremities off the bed against gravity but there does not seem to be any asymmetry in the movement of her extremities and she has no drift.  She reports diminished sensation in the left arm and leg  compared to the right but normal sensation in the face.  No for obvious facial asymmetry.  Cranial nerves II through XII intact.  Gait testing deferred at this time. PSYCH: The patient's mood and manner are appropriate.   ED Results / Procedures / Treatments   LABS: (all labs ordered are listed, but only abnormal results are displayed) Labs Reviewed  CBC - Abnormal; Notable for the following components:      Result Value   WBC 12.9 (*)    RBC 3.68 (*)    Hemoglobin 11.2 (*)    HCT 35.0 (*)    All other components within normal limits  DIFFERENTIAL - Abnormal; Notable for the following components:   Neutro Abs 9.6 (*)    Abs Immature Granulocytes 0.14 (*)    All other components within normal limits  COMPREHENSIVE METABOLIC PANEL - Abnormal; Notable for the following components:   Glucose, Bld 123 (*)    BUN 27 (*)    Creatinine, Ser 1.25 (*)    Total Protein 6.2 (*)    Albumin 3.1 (*)    GFR, Estimated 44 (*)    All other components within normal limits  URINE DRUG SCREEN, QUALITATIVE ([facility] ONLY) - Abnormal; Notable for the following components:   Tricyclic, Ur Screen POSITIVE (*)    Benzodiazepine, Ur Scrn POSITIVE (*)    All other components within normal limits  URINALYSIS, ROUTINE W REFLEX MICROSCOPIC - Abnormal; Notable for the following components:   Color, Urine YELLOW (*)    APPearance CLEAR (*)    All other components within normal limits  RESP PANEL BY RT-PCR (RSV, FLU A&B, COVID)  RVPGX2  ETHANOL  PROTIME-INR  APTT  TSH  LIPID PANEL  HEMOGLOBIN [id number]  BASIC METABOLIC  PANEL  CBC  TROPONIN I (HIGH SENSITIVITY)  TROPONIN I (HIGH SENSITIVITY)     EKG:  EKG Interpretation  Date/Time:  Saturday March 23 2022 23:46:22 EST Ventricular Rate:  65 PR Interval:    QRS Duration: 107 QT Interval:  407 QTC Calculation: 424 R Axis:   74 Text Interpretation: Normal sinus rhythm Borderline ST depression, diffuse leads Confirmed by Pryor Curia [phone]) on 03/24/2022 12:15:45 AM         RADIOLOGY: My personal review and interpretation of imaging:  Imaging shows no acute abnormality.  I have personally reviewed all radiology reports.   MR BRAIN WO CONTRAST  Result Date: 03/24/2022 CLINICAL DATA:  Acute neurologic deficit EXAM: MRI HEAD WITHOUT CONTRAST TECHNIQUE: Multiplanar, multiecho pulse sequences of the brain and surrounding structures were obtained without intravenous contrast. COMPARISON:  07/15/2017 FINDINGS: Brain: No acute infarct, mass effect or extra-axial collection. No chronic microhemorrhage or siderosis. There is multifocal hyperintense T2-weighted signal within the white matter. Parenchymal volume and CSF spaces are normal. Old small vessel infarct of the right basal ganglia. The midline structures are normal. Vascular: Normal flow voids. Skull and upper cervical spine: Normal marrow signal. Sinuses/Orbits: Negative. Other: None. IMPRESSION: 1. No acute intracranial abnormality. 2. Old small vessel infarct of the right basal ganglia and findings of chronic microvascular ischemia. Electronically Signed   By: Ulyses Jarred M.D.   On: 03/24/2022 03:44   DG Lumbar Spine Complete  Result Date: 03/24/2022 CLINICAL DATA:  Fall EXAM: LUMBAR SPINE - COMPLETE 4+ VIEW COMPARISON:  03/18/2022 FINDINGS: Anterior wedge compression deformity T12 with approximately 50% loss of height is unchanged from prior examination. Normal lumbar lordosis. No acute fracture of the lumbar spine. Vertebral body height is preserved. Diffuse  intervertebral disc space narrowing  and endplate remodeling is seen throughout the lumbar spine in keeping with changes of diffuse advanced degenerative disc disease. Paraspinal soft tissues are unremarkable. IMPRESSION: 1. No acute fracture or subluxation. 2. Stable T12 compression deformity. 3. Diffuse advanced degenerative disc disease. Electronically Signed   By: Fidela Salisbury M.D.   On: 03/24/2022 01:07   DG Thoracic Spine 2 View  Result Date: 03/24/2022 CLINICAL DATA:  Fall EXAM: THORACIC SPINE 2 VIEWS COMPARISON:  Lumbar spine radiographs 03/18/2022 Thoracic spine MRI 08/29/2012 FINDINGS: Unchanged appearance of wedge compression fracture at T12 with approximately 50% height loss. This is chronic and was present on MRI of 08/29/2012. No acute abnormality. IMPRESSION: Chronic T12 compression fracture. No acute findings. Electronically Signed   By: Ulyses Jarred M.D.   On: 03/24/2022 01:04   CT Cervical Spine Wo Contrast  Result Date: 03/24/2022 CLINICAL DATA:  Status post trauma. EXAM: CT CERVICAL SPINE WITHOUT CONTRAST TECHNIQUE: Multidetector CT imaging of the cervical spine was performed without intravenous contrast. Multiplanar CT image reconstructions were also generated. RADIATION DOSE REDUCTION: This exam was performed according to the departmental dose-optimization program which includes automated exposure control, adjustment of the mA and/or kV according to patient size and/or use of iterative reconstruction technique. COMPARISON:  December 25, 2012 FINDINGS: Alignment: There is approximately 1 mm retrolisthesis of the C2 vertebral body on C3. Skull base and vertebrae: No acute fracture. Chronic and degenerative changes are seen along the tip of the dens and adjacent portion of the anterior arch of C1. A metallic density fusion plate and screws are seen along the anterior aspects of the C5, C6 and C7 vertebral bodies. Soft tissues and spinal canal: No prevertebral fluid or swelling. No visible canal hematoma. Disc levels: Mild  to moderate severity multilevel endplate sclerosis and mild anterior osteophyte formation is seen throughout the cervical spine. There is moderate to marked severity narrowing of the anterior atlantoaxial articulation. Anterior cervical fusion of the C5-C6 and C6-C7 levels is seen. Mild intervertebral disc space narrowing is present throughout the remainder of the cervical spine. Bilateral marked severity multilevel facet joint hypertrophy is noted. Upper chest: Marked severity emphysematous lung disease is seen within the bilateral apices. Other: None. IMPRESSION: 1. No acute fracture or subluxation within the cervical spine. 2. Multilevel degenerative changes with anterior cervical fusion of the C5-C6 and C6-C7 levels. 3. Marked severity emphysematous lung disease. Emphysema (ICD10-J43.9). Electronically Signed   By: Virgina Norfolk M.D.   On: 03/24/2022 01:02   CT HEAD WO CONTRAST (5MM)  Result Date: 03/24/2022 CLINICAL DATA:  Neurological deficit. EXAM: CT HEAD WITHOUT CONTRAST TECHNIQUE: Contiguous axial images were obtained from the base of the skull through the vertex without intravenous contrast. RADIATION DOSE REDUCTION: This exam was performed according to the departmental dose-optimization program which includes automated exposure control, adjustment of the mA and/or kV according to patient size and/or use of iterative reconstruction technique. COMPARISON:  July 15, 2017 FINDINGS: Brain: There is mild cerebral atrophy with widening of the extra-axial spaces and ventricular dilatation. There are areas of decreased attenuation within the white matter tracts of the supratentorial brain, consistent with microvascular disease changes. A chronic right lentiform nucleus lacunar infarct is seen. Vascular: There is moderate to marked severity calcification of the bilateral cavernous carotid arteries. Skull: A chronic, mildly displaced left-sided nasal bone fracture is seen. Sinuses/Orbits: Postoperative  changes are seen along the medial wall of the right maxillary sinus. Other: None. IMPRESSION: 1. Generalized cerebral atrophy and  chronic white matter small vessel ischemic changes without evidence of an acute intracranial abnormality. 2. Chronic right lentiform nucleus lacunar infarct. Electronically Signed   By: Virgina Norfolk M.D.   On: 03/24/2022 00:57   DG Chest Portable 1 View  Result Date: 03/24/2022 CLINICAL DATA:  Dyspnea, generalized weakness EXAM: PORTABLE CHEST 1 VIEW COMPARISON:  08/17/2019 FINDINGS: The heart size and mediastinal contours are within normal limits. Both lungs are clear. The visualized skeletal structures are unremarkable. IMPRESSION: No active disease. Electronically Signed   By: Fidela Salisbury M.D.   On: 03/24/2022 00:24     PROCEDURES:  Critical Care performed: No      .1-3 Lead EKG Interpretation  Performed by: Serjio Deupree, Delice Bison, DO Authorized by: Aubery Date, Delice Bison, DO     Interpretation: normal     ECG rate:  73   ECG rate assessment: normal     Rhythm: sinus rhythm     Ectopy: none     Conduction: normal       IMPRESSION / MDM / ASSESSMENT AND PLAN / ED COURSE  I reviewed the triage vital signs and the nursing notes.    Patient here with left-sided weakness and numbness that is new for her, last known well 2 days ago.  The patient is on the cardiac monitor to evaluate for evidence of arrhythmia and/or significant heart rate changes.   DIFFERENTIAL DIAGNOSIS (includes but not limited to):   Stroke, TIA, exacerbation of previous stroke symptoms due to UTI, dehydration, anemia, electrolyte derangement, COPD exacerbation, ACS   Patient's presentation is most consistent with acute presentation with potential threat to life or bodily function.   PLAN: Will initiate stroke workup.  Patient is not a tPA candidate given last known well was 2 days ago.  Complaining of neck and back pain from her falls.  Will obtain CT of the head and cervical  spine, x-ray of the thoracic and lumbar spine.  Also having some shortness of breath but lungs clear but there is some diminished aeration.  Will obtain chest x-ray and give breathing treatment.  Will obtain troponin x 2.  EKG initially shows sinus rhythm but significant mount of artifact.  Will repeat.   MEDICATIONS GIVEN IN ED: Medications  aspirin chewable tablet 81 mg (has no administration in time range)  atorvastatin (LIPITOR) tablet 40 mg (has no administration in time range)  metoprolol succinate (TOPROL-XL) 24 hr tablet 12.5 mg (has no administration in time range)  FLUoxetine (PROZAC) capsule 20 mg (has no administration in time range)  baclofen (LIORESAL) tablet 5-10 mg (has no administration in time range)  gabapentin (NEURONTIN) capsule 300 mg (has no administration in time range)  melatonin tablet 10 mg (has no administration in time range)  multivitamin with minerals tablet 1 tablet (has no administration in time range)  potassium chloride (KLOR-CON M) CR tablet 10 mEq (has no administration in time range)  montelukast (SINGULAIR) tablet 10 mg (has no administration in time range)   stroke: early stages of recovery book (has no administration in time range)  enoxaparin (LOVENOX) injection 40 mg (has no administration in time range)  0.9 %  sodium chloride infusion (has no administration in time range)  acetaminophen (TYLENOL) tablet 650 mg (has no administration in time range)    Or  acetaminophen (TYLENOL) suppository 650 mg (has no administration in time range)  traZODone (DESYREL) tablet 25 mg (has no administration in time range)  magnesium hydroxide (MILK OF MAGNESIA) suspension 30 mL (has  no administration in time range)  ondansetron (ZOFRAN) tablet 4 mg (has no administration in time range)    Or  ondansetron (ZOFRAN) injection 4 mg (has no administration in time range)  albuterol (PROVENTIL) (2.5 MG/3ML) 0.083% nebulizer solution 2.5 mg (has no administration in time  range)  QUEtiapine (SEROQUEL) tablet 250 mg (has no administration in time range)  ipratropium-albuterol (DUONEB) 0.5-2.5 (3) MG/3ML nebulizer solution 3 mL (3 mLs Nebulization Given 03/24/22 0001)  fentaNYL (SUBLIMAZE) injection 50 mcg (50 mcg Intravenous Given 03/24/22 0018)  ondansetron (ZOFRAN) injection 4 mg (4 mg Intravenous Given 03/24/22 0018)  iohexol (OMNIPAQUE) 350 MG/ML injection 75 mL (75 mLs Intravenous Contrast Given 03/24/22 0349)     ED COURSE: Patient's labs show slight leukocytosis.  No fever.  Minimally elevated creatinine of 1.25.  First troponin is negative.  TSH normal.  COVID, flu and RSV negative.  Drug screen positive for tricyclics and benzodiazepines.  Ethanol level negative.  Urine shows no sign of infection.  CT head and cervical spine and x-rays of the thoracic and lumbar spine reviewed and interpreted by myself and the radiologist and show no acute abnormality.  Will obtain MRI of the brain.  Anticipate admission to the hospitalist service.  3:54 AM  MRI brain reviewed and interpreted by myself and the radiologist and shows no acute abnormality.  Will admit for generalized weakness and frequent falls.   CONSULTS:  Consulted and discussed patient's case with hospitalist, Dr. Sidney Ace.  I have recommended admission and consulting physician agrees and will place admission orders.  Patient (and family if present) agree with this plan.   I reviewed all nursing notes, vitals, pertinent previous records.  All labs, EKGs, imaging ordered have been independently reviewed and interpreted by myself.    OUTSIDE RECORDS REVIEWED: Reviewed last PCP note on 03/15/2022.       FINAL CLINICAL IMPRESSION(S) / ED DIAGNOSES   Final diagnoses:  Left sided numbness  Generalized weakness  Multiple falls     Rx / DC Orders   ED Discharge Orders     None        Note:  This document was prepared using Dragon voice recognition software and may include unintentional dictation  errors.   Rasheem Figiel, Delice Bison, DO 03/24/22 [phone]

## 2022-03-24 ENCOUNTER — Other Ambulatory Visit: Payer: Self-pay

## 2022-03-24 ENCOUNTER — Observation Stay: Payer: 59

## 2022-03-24 ENCOUNTER — Emergency Department: Payer: 59

## 2022-03-24 ENCOUNTER — Observation Stay
Admit: 2022-03-24 | Discharge: 2022-03-24 | Disposition: A | Discharge status: Home / Self Care | Payer: 59 | Attending: Family Medicine | Admitting: Family Medicine

## 2022-03-24 ENCOUNTER — Encounter: Payer: Self-pay | Admitting: Radiology

## 2022-03-24 DIAGNOSIS — I6389 Other cerebral infarction: Secondary | ICD-10-CM | POA: Diagnosis not present

## 2022-03-24 DIAGNOSIS — F419 Anxiety disorder, unspecified: Secondary | ICD-10-CM

## 2022-03-24 DIAGNOSIS — I672 Cerebral atherosclerosis: Secondary | ICD-10-CM | POA: Diagnosis not present

## 2022-03-24 DIAGNOSIS — R296 Repeated falls: Secondary | ICD-10-CM

## 2022-03-24 DIAGNOSIS — I350 Nonrheumatic aortic (valve) stenosis: Secondary | ICD-10-CM

## 2022-03-24 DIAGNOSIS — R531 Weakness: Secondary | ICD-10-CM

## 2022-03-24 DIAGNOSIS — R0600 Dyspnea, unspecified: Secondary | ICD-10-CM | POA: Diagnosis not present

## 2022-03-24 DIAGNOSIS — R29818 Other symptoms and signs involving the nervous system: Secondary | ICD-10-CM | POA: Diagnosis not present

## 2022-03-24 DIAGNOSIS — G459 Transient cerebral ischemic attack, unspecified: Secondary | ICD-10-CM

## 2022-03-24 DIAGNOSIS — J4489 Other specified chronic obstructive pulmonary disease: Secondary | ICD-10-CM | POA: Diagnosis not present

## 2022-03-24 DIAGNOSIS — I6503 Occlusion and stenosis of bilateral vertebral arteries: Secondary | ICD-10-CM | POA: Diagnosis not present

## 2022-03-24 DIAGNOSIS — T1490XA Injury, unspecified, initial encounter: Secondary | ICD-10-CM | POA: Diagnosis not present

## 2022-03-24 DIAGNOSIS — F32A Depression, unspecified: Secondary | ICD-10-CM | POA: Diagnosis not present

## 2022-03-24 DIAGNOSIS — I6523 Occlusion and stenosis of bilateral carotid arteries: Secondary | ICD-10-CM | POA: Diagnosis not present

## 2022-03-24 DIAGNOSIS — Z043 Encounter for examination and observation following other accident: Secondary | ICD-10-CM | POA: Diagnosis not present

## 2022-03-24 DIAGNOSIS — E785 Hyperlipidemia, unspecified: Secondary | ICD-10-CM

## 2022-03-24 DIAGNOSIS — I340 Nonrheumatic mitral (valve) insufficiency: Secondary | ICD-10-CM | POA: Insufficient documentation

## 2022-03-24 DIAGNOSIS — M5412 Radiculopathy, cervical region: Secondary | ICD-10-CM | POA: Diagnosis not present

## 2022-03-24 DIAGNOSIS — R45851 Suicidal ideations: Secondary | ICD-10-CM | POA: Insufficient documentation

## 2022-03-24 DIAGNOSIS — I771 Stricture of artery: Secondary | ICD-10-CM | POA: Diagnosis not present

## 2022-03-24 DIAGNOSIS — I071 Rheumatic tricuspid insufficiency: Secondary | ICD-10-CM | POA: Insufficient documentation

## 2022-03-24 DIAGNOSIS — I2721 Secondary pulmonary arterial hypertension: Secondary | ICD-10-CM | POA: Insufficient documentation

## 2022-03-24 LAB — BASIC METABOLIC PANEL
Anion gap: 7 (ref 5–15)
BUN: 23 mg/dL (ref 8–23)
CO2: 28 mmol/L (ref 22–32)
Calcium: 8.6 mg/dL — ABNORMAL LOW (ref 8.9–10.3)
Chloride: 104 mmol/L (ref 98–111)
Creatinine, Ser: 1.11 mg/dL — ABNORMAL HIGH (ref 0.44–1.00)
GFR, Estimated: 51 mL/min — ABNORMAL LOW (ref >60)
Glucose, Bld: 102 mg/dL — ABNORMAL HIGH (ref 70–99)
Potassium: 3.9 mmol/L (ref 3.5–5.1)
Sodium: 139 mmol/L (ref 135–145)

## 2022-03-24 LAB — PROTIME-INR
INR: 1.1 (ref 0.8–1.2)
Prothrombin Time: 14.4 seconds (ref 11.4–15.2)

## 2022-03-24 LAB — ECHOCARDIOGRAM COMPLETE BUBBLE STUDY
AR max vel: 1.44 cm2
AV Area VTI: 1.43 cm2
AV Area mean vel: 1.46 cm2
AV Mean grad: 6 mmHg
AV Peak grad: 11.9 mmHg
Ao pk vel: 1.73 m/s
Area-P 1/2: 3.74 cm2
MV M vel: 5.78 m/s
MV Peak grad: 133.6 mmHg
P 1/2 time: 695 msec
Radius: 1.3 cm
S' Lateral: 3.1 cm

## 2022-03-24 LAB — URINE DRUG SCREEN, QUALITATIVE (ARMC ONLY)
Amphetamines, Ur Screen: NOT DETECTED
Barbiturates, Ur Screen: NOT DETECTED
Benzodiazepine, Ur Scrn: POSITIVE — AB
Cannabinoid 50 Ng, Ur ~~LOC~~: NOT DETECTED
Cocaine Metabolite,Ur ~~LOC~~: NOT DETECTED
MDMA (Ecstasy)Ur Screen: NOT DETECTED
Methadone Scn, Ur: NOT DETECTED
Opiate, Ur Screen: NOT DETECTED
Phencyclidine (PCP) Ur S: NOT DETECTED
Tricyclic, Ur Screen: POSITIVE — AB

## 2022-03-24 LAB — CBC
HCT: 32.4 % — ABNORMAL LOW (ref 36.0–46.0)
Hemoglobin: 10.2 g/dL — ABNORMAL LOW (ref 12.0–15.0)
MCH: 30.2 pg (ref 26.0–34.0)
MCHC: 31.5 g/dL (ref 30.0–36.0)
MCV: 95.9 fL (ref 80.0–100.0)
Platelets: 198 10*3/uL (ref 150–400)
RBC: 3.38 MIL/uL — ABNORMAL LOW (ref 3.87–5.11)
RDW: 15.5 % (ref 11.5–15.5)
WBC: 12.2 10*3/uL — ABNORMAL HIGH (ref 4.0–10.5)
nRBC: 0 % (ref 0.0–0.2)

## 2022-03-24 LAB — COMPREHENSIVE METABOLIC PANEL
ALT: 39 U/L (ref 0–44)
AST: 39 U/L (ref 15–41)
Albumin: 3.1 g/dL — ABNORMAL LOW (ref 3.5–5.0)
Alkaline Phosphatase: 103 U/L (ref 38–126)
Anion gap: 7 (ref 5–15)
BUN: 27 mg/dL — ABNORMAL HIGH (ref 8–23)
CO2: 26 mmol/L (ref 22–32)
Calcium: 8.9 mg/dL (ref 8.9–10.3)
Chloride: 107 mmol/L (ref 98–111)
Creatinine, Ser: 1.25 mg/dL — ABNORMAL HIGH (ref 0.44–1.00)
GFR, Estimated: 44 mL/min — ABNORMAL LOW (ref >60)
Glucose, Bld: 123 mg/dL — ABNORMAL HIGH (ref 70–99)
Potassium: 4.2 mmol/L (ref 3.5–5.1)
Sodium: 140 mmol/L (ref 135–145)
Total Bilirubin: 0.8 mg/dL (ref 0.3–1.2)
Total Protein: 6.2 g/dL — ABNORMAL LOW (ref 6.5–8.1)

## 2022-03-24 LAB — TROPONIN I (HIGH SENSITIVITY)
Troponin I (High Sensitivity): 6 ng/L (ref <18)
Troponin I (High Sensitivity): 8 ng/L (ref <18)

## 2022-03-24 LAB — APTT: aPTT: 24 seconds (ref 24–36)

## 2022-03-24 LAB — LIPID PANEL
Cholesterol: 133 mg/dL (ref 0–200)
HDL: 52 mg/dL (ref >40)
LDL Cholesterol: 73 mg/dL (ref 0–99)
Total CHOL/HDL Ratio: 2.6 RATIO
Triglycerides: 40 mg/dL (ref <150)
VLDL: 8 mg/dL (ref 0–40)

## 2022-03-24 LAB — URINALYSIS, ROUTINE W REFLEX MICROSCOPIC
Bilirubin Urine: NEGATIVE
Glucose, UA: NEGATIVE mg/dL
Hgb urine dipstick: NEGATIVE
Ketones, ur: NEGATIVE mg/dL
Leukocytes,Ua: NEGATIVE
Nitrite: NEGATIVE
Protein, ur: NEGATIVE mg/dL
Specific Gravity, Urine: 1.014 (ref 1.005–1.030)
pH: 5 (ref 5.0–8.0)

## 2022-03-24 LAB — RESP PANEL BY RT-PCR (RSV, FLU A&B, COVID)  RVPGX2
Influenza A by PCR: NEGATIVE
Influenza B by PCR: NEGATIVE
Resp Syncytial Virus by PCR: NEGATIVE
SARS Coronavirus 2 by RT PCR: NEGATIVE

## 2022-03-24 LAB — BRAIN NATRIURETIC PEPTIDE: B Natriuretic Peptide: 209.5 pg/mL — ABNORMAL HIGH (ref 0.0–100.0)

## 2022-03-24 LAB — ETHANOL: Alcohol, Ethyl (B): <10 mg/dL (ref <10)

## 2022-03-24 LAB — TSH: TSH: 3.738 u[IU]/mL (ref 0.350–4.500)

## 2022-03-24 MED ORDER — METOPROLOL SUCCINATE ER 25 MG PO TB24
12.5000 mg | ORAL_TABLET | Freq: Every day | ORAL | Status: DC
  Administered 2022-03-24 – 2022-03-25 (×2): 12.5 mg via ORAL
  Filled 2022-03-24 (×2): qty 1

## 2022-03-24 MED ORDER — GABAPENTIN 300 MG PO CAPS: 900.0000 mg | ORAL_CAPSULE | Freq: Three times a day (TID) | ORAL | Status: DC

## 2022-03-24 MED ORDER — ONDANSETRON HCL 4 MG/2ML IJ SOLN
4.0000 mg | Freq: Once | INTRAMUSCULAR | Status: AC
  Administered 2022-03-24: 4 mg via INTRAVENOUS
  Filled 2022-03-24: qty 2

## 2022-03-24 MED ORDER — ONDANSETRON HCL 4 MG PO TABS: 4.0000 mg | ORAL_TABLET | Freq: Four times a day (QID) | ORAL | Status: DC | PRN

## 2022-03-24 MED ORDER — PREDNISONE 20 MG PO TABS: 20.0000 mg | ORAL_TABLET | ORAL | Status: DC

## 2022-03-24 MED ORDER — ALBUTEROL SULFATE HFA 108 (90 BASE) MCG/ACT IN AERS: 1.0000 | INHALATION_SPRAY | Freq: Four times a day (QID) | RESPIRATORY_TRACT | Status: DC | PRN

## 2022-03-24 MED ORDER — MAGNESIUM HYDROXIDE 400 MG/5ML PO SUSP: 30.0000 mL | Freq: Every day | ORAL | Status: DC | PRN

## 2022-03-24 MED ORDER — GADOBUTROL 1 MMOL/ML IV SOLN
6.0000 mL | Freq: Once | INTRAVENOUS | Status: AC | PRN
  Administered 2022-03-24: 7.5 mL via INTRAVENOUS

## 2022-03-24 MED ORDER — BACLOFEN 10 MG PO TABS: 5.0000 mg | ORAL_TABLET | Freq: Three times a day (TID) | ORAL | Status: DC | PRN

## 2022-03-24 MED ORDER — MONTELUKAST SODIUM 10 MG PO TABS
10.0000 mg | ORAL_TABLET | Freq: Every day | ORAL | Status: DC
  Administered 2022-03-24: 10 mg via ORAL
  Filled 2022-03-24: qty 1

## 2022-03-24 MED ORDER — MELATONIN 5 MG PO TABS: 10.0000 mg | ORAL_TABLET | Freq: Every evening | ORAL | Status: DC | PRN

## 2022-03-24 MED ORDER — ACETAMINOPHEN 325 MG PO TABS
650.0000 mg | ORAL_TABLET | Freq: Four times a day (QID) | ORAL | Status: DC | PRN
  Administered 2022-03-25: 650 mg via ORAL
  Filled 2022-03-24: qty 2

## 2022-03-24 MED ORDER — FENTANYL CITRATE PF 50 MCG/ML IJ SOSY
50.0000 ug | PREFILLED_SYRINGE | Freq: Once | INTRAMUSCULAR | Status: AC
  Administered 2022-03-24: 50 ug via INTRAVENOUS
  Filled 2022-03-24: qty 1

## 2022-03-24 MED ORDER — ENOXAPARIN SODIUM 40 MG/0.4ML IJ SOSY
40.0000 mg | PREFILLED_SYRINGE | INTRAMUSCULAR | Status: DC
  Administered 2022-03-24 – 2022-03-25 (×2): 40 mg via SUBCUTANEOUS
  Filled 2022-03-24 (×2): qty 0.4

## 2022-03-24 MED ORDER — QUETIAPINE FUMARATE 25 MG PO TABS
250.0000 mg | ORAL_TABLET | Freq: Every day | ORAL | Status: DC
  Administered 2022-03-24: 250 mg via ORAL
  Filled 2022-03-24: qty 10

## 2022-03-24 MED ORDER — IOHEXOL 350 MG/ML SOLN
75.0000 mL | Freq: Once | INTRAVENOUS | Status: AC | PRN
  Administered 2022-03-24: 75 mL via INTRAVENOUS

## 2022-03-24 MED ORDER — SODIUM CHLORIDE 0.9 % IV SOLN: INTRAVENOUS | Status: DC

## 2022-03-24 MED ORDER — QUETIAPINE FUMARATE 25 MG PO TABS: 50.0000 mg | ORAL_TABLET | Freq: Every day | ORAL | Status: DC

## 2022-03-24 MED ORDER — QUETIAPINE FUMARATE 200 MG PO TABS: 200.0000 mg | ORAL_TABLET | Freq: Every day | ORAL | Status: DC

## 2022-03-24 MED ORDER — ONDANSETRON HCL 4 MG/2ML IJ SOLN: 4.0000 mg | Freq: Four times a day (QID) | INTRAMUSCULAR | Status: DC | PRN

## 2022-03-24 MED ORDER — ACETAMINOPHEN 650 MG RE SUPP: 650.0000 mg | Freq: Four times a day (QID) | RECTAL | Status: DC | PRN

## 2022-03-24 MED ORDER — STROKE: EARLY STAGES OF RECOVERY BOOK: Freq: Once | Status: AC

## 2022-03-24 MED ORDER — GABAPENTIN 300 MG PO CAPS
300.0000 mg | ORAL_CAPSULE | Freq: Three times a day (TID) | ORAL | Status: DC
  Administered 2022-03-24 – 2022-03-25 (×4): 300 mg via ORAL
  Filled 2022-03-24 (×4): qty 1

## 2022-03-24 MED ORDER — ADULT MULTIVITAMIN W/MINERALS CH
1.0000 | ORAL_TABLET | Freq: Every day | ORAL | Status: DC
  Administered 2022-03-24 – 2022-03-25 (×2): 1 via ORAL
  Filled 2022-03-24 (×2): qty 1

## 2022-03-24 MED ORDER — POTASSIUM CHLORIDE CRYS ER 10 MEQ PO TBCR
10.0000 meq | EXTENDED_RELEASE_TABLET | Freq: Every day | ORAL | Status: DC
  Administered 2022-03-24 – 2022-03-25 (×2): 10 meq via ORAL
  Filled 2022-03-24 (×3): qty 1

## 2022-03-24 MED ORDER — ASPIRIN 81 MG PO CHEW
81.0000 mg | CHEWABLE_TABLET | Freq: Every day | ORAL | Status: DC
  Administered 2022-03-24 – 2022-03-25 (×2): 81 mg via ORAL
  Filled 2022-03-24 (×2): qty 1

## 2022-03-24 MED ORDER — ALBUTEROL SULFATE (2.5 MG/3ML) 0.083% IN NEBU: 2.5000 mg | INHALATION_SOLUTION | Freq: Four times a day (QID) | RESPIRATORY_TRACT | Status: DC | PRN

## 2022-03-24 MED ORDER — TRAZODONE HCL 50 MG PO TABS: 25.0000 mg | ORAL_TABLET | Freq: Every evening | ORAL | Status: DC | PRN

## 2022-03-24 MED ORDER — FLUOXETINE HCL 20 MG PO CAPS
20.0000 mg | ORAL_CAPSULE | Freq: Every day | ORAL | Status: DC
  Administered 2022-03-24 – 2022-03-25 (×2): 20 mg via ORAL
  Filled 2022-03-24 (×2): qty 1

## 2022-03-24 MED ORDER — ATORVASTATIN CALCIUM 20 MG PO TABS
40.0000 mg | ORAL_TABLET | Freq: Every day | ORAL | Status: DC
  Administered 2022-03-24 – 2022-03-25 (×2): 40 mg via ORAL
  Filled 2022-03-24 (×2): qty 2

## 2022-03-24 NOTE — H&P (Addendum)
PATIENT NAME: Beth [name]    MR#:  [id number]  DATE OF BIRTH:  [birth year]  DATE OF ADMISSION:  03/23/2022  PRIMARY CARE PHYSICIAN: [name], DO   Patient is coming from: Home  REQUESTING/REFERRING PHYSICIAN: Ward, Delice Bison, DO  CHIEF COMPLAINT:   Chief Complaint  Patient presents with   Weakness    Increased weakness x 2 days. Had 3 falls today. States she is having decreased sensation on L side of her face and slight L sided facial droop   Code Stroke    HISTORY OF PRESENT ILLNESS:  Beth [name] is a 77 y.o. Caucasian female with medical history significant for anxiety, asthma, COPD, CVA, fibromyalgia, hypertension, dyslipidemia, IBS and vitamin D deficiency, who presented to the emergency room with onset of left arm and leg numbness and weakness with subsequent falls X3.  She was also noted to have left facial droop.  She admitted to neck and back pain.  No chest pain or palpitations.  She has been ambulating with walker and before coming to the ER she could not stand.  No fever or chills.  No cough or wheezing or dyspnea.  No urinary or stool incontinence.  No saddle anesthesia.  No witnessed seizures.  No tinnitus or vertigo.  ED Course: When she came to the ER, vital signs were within normal.  Labs showed a BUN of 27 creatinine 1.25 and albumin 3.1 with total protein 6.2 and otherwise unremarkable CMP.  CBC showed WBC of 12.9 UA was negative urine drug screen was positive for benzodiazepine and tricyclic's.  Alcohol level was less than 10. EKG as reviewed by me : EKG showed showed normal sinus rhythm with a rate of 65 with T wave inversion laterally. Imaging: Noncontrasted CT scan revealed generalized atrophy and chronic white matter small vessel ischemic changes with no acute ventricular abnormalities.  It showed chronic right lentiform nucleus lacunar infarct and C-spine CT showed multilevel degenerative disc disease with anterior  cervical fusion of C5-C6 and C6-C7 with no acute fracture or subluxation.  It showed marked severity emphysematous lung disease.  Brain MRI without contrast revealed old small vessel infarct of the right basal ganglia and findings of chronic microvascular ischemia with no acute intracranial abnormalities.  CTA of the head and neck revealed the following: 1. Negative for large vessel occlusion.   2. Mild for age atherosclerosis in the head and neck with no significant arterial stenosis.   3. Positive for pulmonary septal thickening and small layering right pleural effusion superimposed on Emphysema (ICD10-J43.9). Consider Acute Pulmonary Edema.   4. Prior C5 to C7 ACDF with solid arthrodesis.   5.  Aortic Atherosclerosi  The patient was given 4 mg of IV Zofran, fentanyl 50 mcg and DuoNebs, she will be admitted to a medical telemetry observation bed for further evaluation and management. PAST MEDICAL HISTORY:   Past Medical History:  Diagnosis Date   Allergy    Anxiety    Asthma    Chronic pain syndrome    discharged from pain clinic, hx of narcotics seeking behavior   COPD (chronic obstructive pulmonary disease) (HCC)    CVA (cerebral infarction)    Depression    Fibromyalgia    Headache    Hyperlipidemia    Hypertension    IBS (irritable bowel syndrome)    Stroke ([place])    Vitamin D deficiency     PAST SURGICAL HISTORY:   Past Surgical History:  Procedure Laterality Date   ABDOMINAL HYSTERECTOMY     APPENDECTOMY     BREAST EXCISIONAL BIOPSY  2011   Pt states lump removed, ? side, no scar seen   BREAST SURGERY  2011   biopsy   CERVICAL DISCECTOMY     CHOLECYSTECTOMY     SINUSOTOMY      SOCIAL HISTORY:   Social History   Tobacco Use   Smoking status: Former    Packs/day: 0.25    Years: 57.00    Total pack years: 14.25    Types: Cigarettes    Quit date: 10/24/2020    Years since quitting: 1.4   Smokeless tobacco: Never   Tobacco comments:    6-8cig  daily--01/01/2022  Substance Use Topics   Alcohol use: No    Alcohol/week: 0.0 standard drinks of alcohol    FAMILY HISTORY:   Family History  Problem Relation Age of Onset   Anxiety disorder Mother    Depression Mother    Breast cancer Mother 48   Cancer Father    Gallbladder disease Father    Alcohol abuse Father    Depression Father    Bipolar disorder Son     DRUG ALLERGIES:   Allergies  Allergen Reactions   Bextra  [Valdecoxib]    Compazine [Prochlorperazine Edisylate]     Stroke-like symptoms   Lithium Carbonate     Leg weakness   Lyrica [Pregabalin]     REVIEW OF SYSTEMS:   ROS As per history of present illness. All pertinent systems were reviewed above. Constitutional, HEENT, cardiovascular, respiratory, GI, GU, musculoskeletal, neuro, psychiatric, endocrine, integumentary and hematologic systems were reviewed and are otherwise negative/unremarkable except for positive findings mentioned above in the HPI.   MEDICATIONS AT HOME:   Prior to Admission medications   Medication Sig Start Date End Date Taking? Authorizing Provider  acetaminophen (TYLENOL) 500 MG tablet Take 500 mg by mouth 2 (two) times daily as needed.    [provider]  albuterol (VENTOLIN HFA) 108 (90 Base) MCG/ACT inhaler Inhale 1-2 puffs into the lungs every 6 (six) hours as needed for wheezing or shortness of breath. 02/06/22   Karamalegos, [name], DO  aspirin 81 MG chewable tablet Chew 81 mg by mouth daily.    [provider]  atorvastatin (LIPITOR) 40 MG tablet TAKE 1 TABLET BY MOUTH EVERY DAY 12/26/21   Karamalegos, [name], DO  baclofen (LIORESAL) 10 MG tablet Take 0.5-1 tablets (5-10 mg total) by mouth 3 (three) times daily as needed for muscle spasms. 03/15/22   Karamalegos, [name], DO  FLUoxetine (PROZAC) 20 MG capsule TAKE 3 CAPSULES(60 MG) BY MOUTH DAILY 02/07/22   Parks Ranger, [name], DO  Fluticasone-Umeclidin-Vilant (TRELEGY ELLIPTA) 200-62.5-25  MCG/ACT AEPB INHALE 1 PUFF INTO THE LUNGS DAILY 02/07/22   [name], MD  furosemide (LASIX) 20 MG tablet Take 2 tablets (40 mg total) by mouth daily. If swelling is less, can take only 1 pill. 02/21/21   Karamalegos, [name], DO  gabapentin (NEURONTIN) 300 MG capsule Take 1 capsule (300 mg total) by mouth 3 (three) times daily. 10/30/21 04/28/22  [name], MD  gabapentin (NEURONTIN) 300 MG capsule Take 3 capsules (900 mg total) by mouth 3 (three) times daily. 09/05/21 03/04/22  [name], MD  LORazepam (ATIVAN) 0.5 MG tablet Take 1 tablet (0.5 mg total) by mouth every other day. 03/29/22 05/28/22  Norman Clay, MD  Melatonin 3 MG CAPS Take 10 mg by mouth at bedtime as  needed (sleep).     [provider]  metoprolol succinate (TOPROL-XL) 25 MG 24 hr tablet TAKE 1/2 TABLET(12.5 MG) BY MOUTH DAILY 02/18/22   Parks Ranger, [name], DO  montelukast (SINGULAIR) 10 MG tablet TAKE 1 TABLET(10 MG) BY MOUTH AT BEDTIME 03/13/22   Karamalegos, [name], DO  multivitamin-iron-minerals-folic acid (CENTRUM) chewable tablet Chew 1 tablet by mouth daily.    [provider]  potassium chloride (KLOR-CON) 10 MEQ tablet Take 1 tablet (10 mEq total) by mouth daily. 02/21/21   Karamalegos, [name], DO  predniSONE (DELTASONE) 20 MG tablet Take daily with food. Start with '60mg'$  (3 pills) x 2 days, then reduce to '40mg'$  (2 pills) x 2 days, then '20mg'$  (1 pill) x 3 days 03/15/22   [name], DO  QUEtiapine (SEROQUEL) 200 MG tablet Take 1 tablet (200 mg total) by mouth at bedtime. Take total of 250 mg. Take along with 50 mg tab 03/19/22 06/17/22  Norman Clay, MD  QUEtiapine (SEROQUEL) 50 MG tablet Take 1 tablet (50 mg total) by mouth at bedtime. Total of 250 mg at night. Take along with 200 mg tab 03/06/22 06/04/22  Norman Clay, MD      VITAL SIGNS:  Blood pressure (!) 127/58, pulse 71, temperature 97.6 F (36.4 C), temperature source Oral, resp. rate 20, SpO2 100 %.  PHYSICAL  EXAMINATION:  Physical Exam  GENERAL:  77 y.o.-year-old Caucasian female patient lying in the bed with no acute distress.  EYES: Pupils equal, round, reactive to light and accommodation. No scleral icterus. Extraocular muscles intact.  HEENT: Head atraumatic, normocephalic. Oropharynx and nasopharynx clear.  NECK:  Supple, no jugular venous distention. No thyroid enlargement, no tenderness.  LUNGS: Normal breath sounds bilaterally, no wheezing, rales,rhonchi or crepitation. No use of accessory muscles of respiration.  CARDIOVASCULAR: Regular rate and rhythm, S1, S2 normal. No murmurs, rubs, or gallops.  ABDOMEN: Soft, nondistended, nontender. Bowel sounds present. No organomegaly or mass.  EXTREMITIES: No pedal edema, cyanosis, or clubbing.  NEUROLOGIC: Cranial nerves II through XII are intact. Muscle strength 5/5 in all extremities except for muscle strength of 3-4/5 in the left lower extremity.. Sensation intact. Gait not checked.  PSYCHIATRIC: The patient is alert and oriented x 3.  Normal affect and good eye contact. SKIN: No obvious rash, lesion, or ulcer.   LABORATORY PANEL:   CBC Recent Labs  Lab 03/24/22 0510  WBC 12.2*  HGB 10.2*  HCT 32.4*  PLT 198   ------------------------------------------------------------------------------------------------------------------  Chemistries  Recent Labs  Lab 03/23/22 2346 03/24/22 0510  NA 140 139  K 4.2 3.9  CL 107 104  CO2 26 28  GLUCOSE 123* 102*  BUN 27* 23  CREATININE 1.25* 1.11*  CALCIUM 8.9 8.6*  AST 39  --   ALT 39  --   ALKPHOS 103  --   BILITOT 0.8  --    ------------------------------------------------------------------------------------------------------------------  Cardiac Enzymes No results for input(s): "TROPONINI" in the last 168 hours. ------------------------------------------------------------------------------------------------------------------  RADIOLOGY:  CT ANGIO HEAD NECK W WO CM  Result  Date: 03/24/2022 CLINICAL DATA:  77 year old female with neurologic deficit. Chronic small vessel disease on MRI. EXAM: CT ANGIOGRAPHY HEAD AND NECK TECHNIQUE: Multidetector CT imaging of the head and neck was performed using the standard protocol during bolus administration of intravenous contrast. Multiplanar CT image reconstructions and MIPs were obtained to evaluate the vascular anatomy. Carotid stenosis measurements (when applicable) are obtained utilizing NASCET criteria, using the distal internal carotid diameter as the denominator. RADIATION DOSE  REDUCTION: This exam was performed according to the departmental dose-optimization program which includes automated exposure control, adjustment of the mA and/or kV according to patient size and/or use of iterative reconstruction technique. CONTRAST:  16m OMNIPAQUE IOHEXOL 350 MG/ML SOLN COMPARISON:  Brain MRI 0313 hours today.  Head CT yesterday. FINDINGS: CTA NECK Skeleton: C5-C6 and C6-C7 ACDF with solid arthrodesis. Mild for age cervical spine degeneration elsewhere. Mandible motion artifact. Absent maxillary dentition. No acute osseous abnormality identified. Upper chest: Centrilobular emphysema with diffuse pulmonary septal thickening. Small or trace layering right pleural effusion. No superior mediastinal lymphadenopathy. Other neck: No acute finding. Aortic arch: Calcified aortic atherosclerosis. 3 vessel arch configuration. Right carotid system: Brachiocephalic artery origin calcified plaque without stenosis. Negative right CCA origin. Faint proximal right CCA calcified plaque without stenosis. Fairly capacious right carotid bifurcation with only mild irregularity of the right ICA bulb. Tortuous vessel with a kinked appearance at the distal bulb, but no stenosis to the skull base. Left carotid system: Similar minimal to mild atherosclerosis for age and no stenosis. Vertebral arteries: Minimal proximal right subclavian atherosclerosis. Normal right  vertebral artery origin. Mildly non dominant right vertebral artery is patent to the skull base with no significant plaque or stenosis. Proximal left subclavian soft and calcified plaque with less than 50% stenosis. Left vertebral artery origin is normal. Proximal V1 segment is tortuous and partially obscured from ACDF streak artifact. The left vertebral artery is mildly dominant and patent to the skull base with no significant plaque or stenosis. CTA HEAD Posterior circulation: Mild distal vertebral artery and vertebrobasilar junction tortuosity. Fairly codominant V4 segments distal to the left PICA origin. Right PICA diminutive or absent. Patent basilar artery and SCA origins. Fetal type PCA origins, more so the left. Patent basilar tip. Bilateral PCA branches are within normal limits. Anterior circulation: Both ICA siphons are patent. On the left there is mild to moderate cavernous and supraclinoid segment calcified plaque, no significant stenosis. Normal left posterior communicating artery origin. On the right similar mild to moderate cavernous segment plaque without significant stenosis. Normal right posterior communicating artery origin. Patent carotid termini. Normal MCA and ACA origins. Mildly dominant right A1 segment. Normal anterior communicating artery. Bilateral ACA branches are within normal limits. Left MCA M1 segment bifurcates early without stenosis. Right MCA M1 segment and trifurcation are patent without stenosis. Bilateral MCA branches are within normal limits. Venous sinuses: Early contrast timing, grossly patent. Anatomic variants: Fetal PCA origins. Mildly dominant left vertebral artery and right ACA A1. Review of the MIP images confirms the above findings IMPRESSION: 1. Negative for large vessel occlusion. 2. Mild for age atherosclerosis in the head and neck with no significant arterial stenosis. 3. Positive for pulmonary septal thickening and small layering right pleural effusion  superimposed on Emphysema (ICD10-J43.9). Consider Acute Pulmonary Edema. 4. Prior C5 to C7 ACDF with solid arthrodesis. 5.  Aortic Atherosclerosis (ICD10-I70.0). Electronically Signed   By: HGenevie AnnM.D.   On: 03/24/2022 04:08   MR BRAIN WO CONTRAST  Result Date: 03/24/2022 CLINICAL DATA:  Acute neurologic deficit EXAM: MRI HEAD WITHOUT CONTRAST TECHNIQUE: Multiplanar, multiecho pulse sequences of the brain and surrounding structures were obtained without intravenous contrast. COMPARISON:  07/15/2017 FINDINGS: Brain: No acute infarct, mass effect or extra-axial collection. No chronic microhemorrhage or siderosis. There is multifocal hyperintense T2-weighted signal within the white matter. Parenchymal volume and CSF spaces are normal. Old small vessel infarct of the right basal ganglia. The midline structures are normal. Vascular: Normal flow voids.  Skull and upper cervical spine: Normal marrow signal. Sinuses/Orbits: Negative. Other: None. IMPRESSION: 1. No acute intracranial abnormality. 2. Old small vessel infarct of the right basal ganglia and findings of chronic microvascular ischemia. Electronically Signed   By: Ulyses Jarred M.D.   On: 03/24/2022 03:44   DG Lumbar Spine Complete  Result Date: 03/24/2022 CLINICAL DATA:  Fall EXAM: LUMBAR SPINE - COMPLETE 4+ VIEW COMPARISON:  03/18/2022 FINDINGS: Anterior wedge compression deformity T12 with approximately 50% loss of height is unchanged from prior examination. Normal lumbar lordosis. No acute fracture of the lumbar spine. Vertebral body height is preserved. Diffuse intervertebral disc space narrowing and endplate remodeling is seen throughout the lumbar spine in keeping with changes of diffuse advanced degenerative disc disease. Paraspinal soft tissues are unremarkable. IMPRESSION: 1. No acute fracture or subluxation. 2. Stable T12 compression deformity. 3. Diffuse advanced degenerative disc disease. Electronically Signed   By: Fidela Salisbury M.D.   On:  03/24/2022 01:07   DG Thoracic Spine 2 View  Result Date: 03/24/2022 CLINICAL DATA:  Fall EXAM: THORACIC SPINE 2 VIEWS COMPARISON:  Lumbar spine radiographs 03/18/2022 Thoracic spine MRI 08/29/2012 FINDINGS: Unchanged appearance of wedge compression fracture at T12 with approximately 50% height loss. This is chronic and was present on MRI of 08/29/2012. No acute abnormality. IMPRESSION: Chronic T12 compression fracture. No acute findings. Electronically Signed   By: Ulyses Jarred M.D.   On: 03/24/2022 01:04   CT Cervical Spine Wo Contrast  Result Date: 03/24/2022 CLINICAL DATA:  Status post trauma. EXAM: CT CERVICAL SPINE WITHOUT CONTRAST TECHNIQUE: Multidetector CT imaging of the cervical spine was performed without intravenous contrast. Multiplanar CT image reconstructions were also generated. RADIATION DOSE REDUCTION: This exam was performed according to the departmental dose-optimization program which includes automated exposure control, adjustment of the mA and/or kV according to patient size and/or use of iterative reconstruction technique. COMPARISON:  December 25, 2012 FINDINGS: Alignment: There is approximately 1 mm retrolisthesis of the C2 vertebral body on C3. Skull base and vertebrae: No acute fracture. Chronic and degenerative changes are seen along the tip of the dens and adjacent portion of the anterior arch of C1. A metallic density fusion plate and screws are seen along the anterior aspects of the C5, C6 and C7 vertebral bodies. Soft tissues and spinal canal: No prevertebral fluid or swelling. No visible canal hematoma. Disc levels: Mild to moderate severity multilevel endplate sclerosis and mild anterior osteophyte formation is seen throughout the cervical spine. There is moderate to marked severity narrowing of the anterior atlantoaxial articulation. Anterior cervical fusion of the C5-C6 and C6-C7 levels is seen. Mild intervertebral disc space narrowing is present throughout the remainder  of the cervical spine. Bilateral marked severity multilevel facet joint hypertrophy is noted. Upper chest: Marked severity emphysematous lung disease is seen within the bilateral apices. Other: None. IMPRESSION: 1. No acute fracture or subluxation within the cervical spine. 2. Multilevel degenerative changes with anterior cervical fusion of the C5-C6 and C6-C7 levels. 3. Marked severity emphysematous lung disease. Emphysema (ICD10-J43.9). Electronically Signed   By: Virgina Norfolk M.D.   On: 03/24/2022 01:02   CT HEAD WO CONTRAST (5MM)  Result Date: 03/24/2022 CLINICAL DATA:  Neurological deficit. EXAM: CT HEAD WITHOUT CONTRAST TECHNIQUE: Contiguous axial images were obtained from the base of the skull through the vertex without intravenous contrast. RADIATION DOSE REDUCTION: This exam was performed according to the departmental dose-optimization program which includes automated exposure control, adjustment of the mA and/or kV according to patient  size and/or use of iterative reconstruction technique. COMPARISON:  July 15, 2017 FINDINGS: Brain: There is mild cerebral atrophy with widening of the extra-axial spaces and ventricular dilatation. There are areas of decreased attenuation within the white matter tracts of the supratentorial brain, consistent with microvascular disease changes. A chronic right lentiform nucleus lacunar infarct is seen. Vascular: There is moderate to marked severity calcification of the bilateral cavernous carotid arteries. Skull: A chronic, mildly displaced left-sided nasal bone fracture is seen. Sinuses/Orbits: Postoperative changes are seen along the medial wall of the right maxillary sinus. Other: None. IMPRESSION: 1. Generalized cerebral atrophy and chronic white matter small vessel ischemic changes without evidence of an acute intracranial abnormality. 2. Chronic right lentiform nucleus lacunar infarct. Electronically Signed   By: Virgina Norfolk M.D.   On: 03/24/2022 00:57    DG Chest Portable 1 View  Result Date: 03/24/2022 CLINICAL DATA:  Dyspnea, generalized weakness EXAM: PORTABLE CHEST 1 VIEW COMPARISON:  08/17/2019 FINDINGS: The heart size and mediastinal contours are within normal limits. Both lungs are clear. The visualized skeletal structures are unremarkable. IMPRESSION: No active disease. Electronically Signed   By: Fidela Salisbury M.D.   On: 03/24/2022 00:24      IMPRESSION AND PLAN:  Assessment and Plan: * Acute left-sided weakness - This is likely associated with transient chemical attack given negative MRI.  Differential diagnosis would include cervical radiculopathy. - The patient will be admitted to an observation medically monitored bed.   - We will follow neuro checks q.4 hours for 24 hours.   - The patient will be placed on aspirin.   - Will obtain  2D echo with bubble study .   - A neurology consultation  as well as physical/occupation/speech therapy consults will be obtained in a.m.Marland Kitchen - I notified Dr. Rory Percy about the patient. - We will obtain a C-spine MRI. - The patient will be placed on statin therapy and fasting lipids will be checked.    Asthma with COPD - We will continue her albuterol and Singulair.  Anxiety and depression - We will continue Xanax, Prozac and Seroquel.  Low back pain with radiation She will continue prednisone taper.  Dyslipidemia - We will continue statin therapy and check fasting lipids.    DVT prophylaxis: Lovenox.  Advanced Care Planning:  Code Status: full code.  Family Communication:  The plan of care was discussed in details with the patient (and family). I answered all questions. The patient agreed to proceed with the above mentioned plan. Further management will depend upon hospital course. Disposition Plan: Back to previous home environment Consults called: Neurology. All the records are reviewed and case discussed with ED provider.  Status is: Observation  I certify that at the time of  admission, it is my clinical judgment that the patient will require hospital care extending less than 2 midnights.                            Dispo: The patient is from: Home              Anticipated d/c is to: Home              Patient currently is not medically stable to d/c.              Difficult to place patient: No  Christel Mormon M.D on 03/24/2022 at 5:45 AM  [facility]   From 7 PM-7 AM, contact night-coverage [email or web address]  CC: Primary care physician; [name], DO

## 2022-03-24 NOTE — Plan of Care (Signed)
  Problem: Education: Goal: Knowledge of disease or condition will improve Outcome: Progressing Goal: Knowledge of secondary prevention will improve (MUST DOCUMENT ALL) Outcome: Progressing Goal: Knowledge of patient specific risk factors will improve Elta [place] N/A or DELETE if not current risk factor) Outcome: Progressing   Problem: Ischemic Stroke/TIA Tissue Perfusion: Goal: Complications of ischemic stroke/TIA will be minimized Outcome: Progressing   Problem: Coping: Goal: Will verbalize positive feelings about self Outcome: Progressing Goal: Will identify appropriate support needs Outcome: Progressing   Problem: Health Behavior/Discharge Planning: Goal: Ability to manage health-related needs will improve Outcome: Progressing Goal: Goals will be collaboratively established with patient/family Outcome: Progressing   Problem: Self-Care: Goal: Ability to participate in self-care as condition permits will improve Outcome: Progressing Goal: Verbalization of feelings and concerns over difficulty with self-care will improve Outcome: Progressing Goal: Ability to communicate needs accurately will improve Outcome: Progressing   Problem: Nutrition: Goal: Risk of aspiration will decrease Outcome: Progressing Goal: Dietary intake will improve Outcome: Progressing   Problem: Education: Goal: Knowledge of General Education information will improve Description: Including pain rating scale, medication(s)/side effects and non-pharmacologic comfort measures Outcome: Progressing   Problem: Health Behavior/Discharge Planning: Goal: Ability to manage health-related needs will improve Outcome: Progressing   Problem: Clinical Measurements: Goal: Ability to maintain clinical measurements within normal limits will improve Outcome: Progressing Goal: Will remain free from infection Outcome: Progressing Goal: Diagnostic test results will improve Outcome: Progressing Goal: Respiratory  complications will improve Outcome: Progressing Goal: Cardiovascular complication will be avoided Outcome: Progressing   Problem: Activity: Goal: Risk for activity intolerance will decrease Outcome: Progressing  Pt is a 2 person assist oob to bsc; left side weakness Problem: Nutrition: Goal: Adequate nutrition will be maintained Outcome: Progressing   Problem: Coping: Goal: Level of anxiety will decrease Outcome: Progressing   Problem: Elimination: Goal: Will not experience complications related to bowel motility Outcome: Progressing Goal: Will not experience complications related to urinary retention Outcome: Progressing   Problem: Pain Managment: Goal: General experience of comfort will improve Outcome: Progressing   Problem: Safety: Goal: Ability to remain free from injury will improve Outcome: Progressing   Problem: Skin Integrity: Goal: Risk for impaired skin integrity will decrease Outcome: Progressing

## 2022-03-24 NOTE — Assessment & Plan Note (Signed)
-   We will continue statin therapy and check fasting lipids.

## 2022-03-24 NOTE — TOC Initial Note (Signed)
Transition of Care [facility]) - Initial/Assessment Note    Patient Details  Name: Beth [name] MRN: [id number] Date of Birth: 10-16-1945  Transition of Care [facility]) CM/SW Contact:    Izola Price, RN Phone Number: 03/24/2022, 2:48 PM  Clinical Narrative:  2/25: Patient admitted for 3 falls in a day, weakness, left sided weakness and numbness, with Left facial droop.Left sided issues thought to be related to cervical surgical issues not a stroke or TIA per notes, though a Code Stroke was called on admission. CT/MRI completed. PT recommending [place] with family assist, but patient lives in a one level apartment alone.   RN CM spoke with son regarding how much assistance he can realistically provide, and he stated he lives in [place] and patient in [place], [place]. He drives and can realistically come to help patient 2 maybe 3 times a day. He is on disability and has back issues so he is unsure he could help if she fell. He goes to the grocery store for her and drives her to appointments but has concerns about her general strength, balance, and ability to even walk. Discussed PT evaluation notes today indicated she was able to walk a lap around unit using  RW with PT present. Patient also has baseline tremors in addition to the weakness on left side and in general. Son stated when she fell recently it took her 3 hours to crawl to bedroom. Patient does have a cell phone and discussed ways to make sure she can have it on her in case of falls. She does have some friends in the apartment complex she can call but not during the night time.   Essentially family assist in this case is minimal, during the day, son not present in the home, and cannot be there but a for couple check-ins per day. Given the total picture at this moment in time, patient would need full [place] services including a SW to assist with other resources if obtainable for minimally safe discharge plan. Patient would remain a high fall risk given current  admission/history. Patient has a RW at home per the son. It would be imperative that if patient were to go home with [facility] services in place they could see patient in home setting asap. [facility] could not accept today, maybe first of the week.  PCP: DR [facility] [phone] - [address], [address] AT [id number]  Patient is has a [facility].  Updated provider and PT/OT on conversation with son. Simmie Davies RN CM                   Patient Goals and CMS Choice            Expected Discharge Plan and Services                                              Prior Living Arrangements/Services                       Activities of Daily Living Home Assistive Devices/Equipment: Gilford Rile (specify type) ADL Screening (condition at time of admission) Patient's cognitive ability adequate to safely complete daily activities?: Yes Is the patient deaf or have difficulty hearing?: No Does the patient have difficulty seeing, even when wearing  glasses/contacts?: No Does the patient have difficulty concentrating, remembering, or making decisions?: No Patient able to express need for assistance with ADLs?: Yes Does the patient have difficulty dressing or bathing?: No Independently performs ADLs?: Yes (appropriate for developmental age) Does the patient have difficulty walking or climbing stairs?: Yes Weakness of Legs: Left Weakness of Arms/Hands: Left  Permission Sought/Granted                  Emotional Assessment              Admission diagnosis:  Acute left-sided weakness [R53.1] Generalized weakness [R53.1] Multiple falls [R29.6] Left sided numbness [R20.0] Patient Active Problem List   Diagnosis Date Noted   Acute left-sided weakness 03/24/2022   Anxiety and depression 03/24/2022   Asthma with COPD 03/24/2022   TIA (transient ischemic attack) 03/24/2022   Multiple falls 03/24/2022   Severe  pulmonary arterial systolic hypertension ([place]) 03/24/2022   Moderate mitral regurgitation 03/24/2022   Moderate tricuspid regurgitation 03/24/2022   Moderate aortic valve stenosis 03/24/2022   Chronic diastolic congestive heart failure ([place]) 11/14/2020   Rash 01/14/2020   Pulmonary hypertension ([place]) 09/14/2019   Coccydynia 07/29/2019   Cervicalgia 05/04/2019   Acute pain of left shoulder 05/04/2019   Elbow pain, left 05/04/2019   DOE (dyspnea on exertion) 03/23/2019   Leg swelling 03/22/2019    Class: Acute   Lumbar degenerative disc disease 09/10/2018   Left hip pain 09/10/2018   Post-traumatic osteoarthritis of left elbow 07/14/2018   Lumbar radiculopathy 02/12/2018   Lumbar spondylosis 02/12/2018   Lumbar facet arthropathy 02/12/2018   Atherosclerosis of aorta ([place]) 09/17/2017   Lung nodule 09/17/2017   Urinary tract infection 12/05/2016   Protein-calorie malnutrition, mild (HCC) 08/07/2016   Generalized anxiety disorder 07/15/2016   Chronic pain syndrome 07/15/2016   Severe episode of recurrent major depressive disorder, without psychotic features ([place]) 07/14/2016   Moderate benzodiazepine use disorder ([place]) 05/08/2016   Major depressive disorder, recurrent, severe w/o psychotic behavior ([place]) 05/01/2016   Seasonal allergic rhinitis 03/23/2015   Hyperglycemia 03/23/2015   Senile purpura ([place]) 03/23/2015   Perennial allergic rhinitis with seasonal variation 03/23/2015   Marital problems 10/31/2014   Migraine without aura and without status migrainosus, not intractable 10/31/2014   Myofascial pain syndrome of lumbar spine 08/09/2014   Colon polyp 08/09/2014   COPD, severe ([place]) 08/09/2014   CVA, old, hemiparesis ([place]) 08/09/2014   Dyslipidemia 08/09/2014   Dysfunction of eustachian tube 08/09/2014   Fibromyalgia syndrome 08/09/2014   Gastro-esophageal reflux disease without esophagitis 08/09/2014   Benign migrating glossitis 08/09/2014   Cerebrovascular accident, old  08/09/2014   IBS (irritable bowel syndrome) 08/09/2014   Low back pain with radiation 08/09/2014   Chronic recurrent major depressive disorder ([place]) [id number]   Dysmetabolic syndrome [id number]   OP (osteoporosis) 08/09/2014   Vitamin D deficiency 08/09/2014   Smoking 08/09/2014   Benign hypertension 07/19/2013   Benign neoplasm of skin of trunk 06/03/2013   H/O: pneumonia 09/25/2012   PCP:  [name], DO Pharmacy:   [facility] [phone] [address], [place] AT [facility] OF [address] [place] [address] Phone: [phone] Fax: [phone]     Social Determinants of Health (SDOH) Social History: SDOH Screenings   Food Insecurity: No Food Insecurity (02/01/2022)  Housing: Low Risk  (02/01/2022)  Transportation Needs: No Transportation Needs (02/01/2022)  Utilities: Not At Risk (02/01/2022)  Alcohol Screen: Low  Risk  (02/01/2022)  Depression (PHQ2-9): Medium Risk (03/15/2022)  Financial Resource Strain: Low Risk  (02/01/2022)  Physical Activity: Insufficiently Active (02/01/2022)  Social Connections: Socially Isolated (02/01/2022)  Stress: No Stress Concern Present (02/01/2022)  Tobacco Use: Medium Risk (03/24/2022)   SDOH Interventions:     Readmission Risk Interventions     No data to display

## 2022-03-24 NOTE — Plan of Care (Signed)

## 2022-03-24 NOTE — Evaluation (Signed)
Occupational Therapy Evaluation Patient Details Name: Beth [name] MRN: [id number] DOB: 02-Jan-1946 Today's Date: 03/24/2022   History of Present Illness Pt is a 77 year old female presenting to the ED with onset of left arm and leg numbness and weakness with subsequent falls X3; admitted with potential associated with transient chemical attack given negative MRI.  Differential diagnosis would include cervical radiculopathy   Clinical Impression   Chart reviewed, pt greeted in hallway with PT, hand off provided to this therapist. Pt is alert and oriented x4, increased time for processing with fair awareness of deficits. Pt participated in SLUMS assessment pt scores 15/30 demonstrating deficits in delayed recal, numerical calculation and registration, immediate recall, registration and digit span, executive functioning, would recommend further of cognition. Pt performs ADL at a SET UP-supervision level in chair, supervision-CGA for functional mobility, toilet transfer with RW. Mild tremor noted throughout BUE with R cervical flexion noted in sitting (which appears flexible). Bue strength is grossly symmetrical with 4-/5 throughout, slightly weaker grip strength on L hand. Per discussion with team and pt, pt has limited assist at home and fell multiple times leading up to admission. At this time would recommend discharge to STR to address deficits however potential to progress to home with [facility] pending further progress on floor, family assist. Pt reports she would like to go home to her dog. Pt is left in bedside chair, all needs met. OT will continue to follow acutely.      Recommendations for follow up therapy are one component of a multi-disciplinary discharge planning process, led by the attending physician.  Recommendations may be updated based on patient status, additional functional criteria and insurance authorization.   Follow Up Recommendations  Skilled nursing-short term rehab (<3  hours/day) (potential to progress to home with [place], [place] sw(max [place] ideally) with family support pending progress)     Assistance Recommended at Discharge Frequent or constant Supervision/Assistance  Patient can return home with the following A little help with walking and/or transfers;A little help with bathing/dressing/bathroom    Functional Status Assessment  Patient has had a recent decline in their functional status and demonstrates the ability to make significant improvements in function in a reasonable and predictable amount of time.  Equipment Recommendations  BSC/3in1;Tub/shower seat (2WW)    Recommendations for Other Services       Precautions / Restrictions Precautions Precautions: Fall Restrictions Weight Bearing Restrictions: No      Mobility Bed Mobility               General bed mobility comments: NT in recliner pre/post session    Transfers Overall transfer level: Needs assistance Equipment used: Rolling walker (2 wheels) Transfers: Sit to/from Stand Sit to Stand: Supervision                  Balance Overall balance assessment: Needs assistance Sitting-balance support: Feet supported Sitting balance-Leahy Scale: Good     Standing balance support: During functional activity, Bilateral upper extremity supported Standing balance-Leahy Scale: Fair                             ADL either performed or assessed with clinical judgement   ADL Overall ADL's : Needs assistance/impaired     Grooming: Wash/dry hands;Standing;Min guard Grooming Details (indicate cue type and reason): sink level with RW         Upper Body Dressing : Supervision/safety;Set up   Lower Body Dressing:  Supervision/safety;Sitting/lateral leans;Set up Lower Body Dressing Details (indicate cue type and reason): socks Toilet Transfer: Supervision/safety;Set up;Rolling walker (2 wheels);Regular Toilet;Cueing for safety   Toileting- Clothing Manipulation and  Hygiene: Supervision/safety;Sitting/lateral lean       Functional mobility during ADLs: Supervision/safety;Min guard;Cueing for safety;Cueing for sequencing;Rolling walker (2 wheels)       Vision Patient Visual Report: No change from baseline       Perception     Praxis      Pertinent Vitals/Pain Pain Assessment Pain Assessment: No/denies pain     Hand Dominance Right   Extremity/Trunk Assessment Upper Extremity Assessment Upper Extremity Assessment: Generalized weakness (mild tremor noted throughout BUE)   Lower Extremity Assessment Lower Extremity Assessment: Generalized weakness   Cervical / Trunk Assessment Cervical / Trunk Assessment:  (R cervical lateral flexion at rest in sitting)   Communication Communication Communication: No difficulties   Cognition Arousal/Alertness: Awake/alert Behavior During Therapy: WFL for tasks assessed/performed Overall Cognitive Status: No family/caregiver present to determine baseline cognitive functioning Area of Impairment: Memory, Following commands, Safety/judgement, Awareness, Problem solving                     Memory: Decreased short-term memory   Safety/Judgement: Decreased awareness of deficits Awareness: Emergent Problem Solving: Slow processing General Comments: Slums administered- pt scores 15/30 demonstrating deficits in delayed recall, numerical calculation and registration, immediate recall, registration and digit span, executive functioning, would recommend further assessment     General Comments  vitals monitored, appear stable throughout    Exercises Other Exercises Other Exercises: Edu re: role of OT, role of rehab, discharge recommendations, home safety, falls prevention, DME use, safe ADL completion at home   Shoulder Instructions      Home Living Family/patient expects to be discharged to:: Private residence Living Arrangements: Alone Available Help at Discharge: Family;Available  PRN/intermittently Type of Home: Apartment Home Access: Level entry     Home Layout: One level     Bathroom Shower/Tub: Tub/shower unit (tub cut out so she is able to walk in)         Home Equipment: Rollator (4 wheels)          Prior Functioning/Environment Prior Level of Function : Independent/Modified Independent;Driving             Mobility Comments: Pt reports MOD I household/community distances with rollator, reports 3 falls in the day PTA ADLs Comments: MOD I-I in ADL/IADL, cooks, cleans, drives per pt report        OT Problem List: Decreased strength;Decreased activity tolerance;Decreased knowledge of use of DME or AE;Decreased safety awareness;Decreased cognition;Impaired balance (sitting and/or standing)      OT Treatment/Interventions: Self-care/ADL training;DME and/or AE instruction;Therapeutic activities;Balance training;Therapeutic exercise;Patient/family education    OT Goals(Current goals can be found in the care plan section) Acute Rehab OT Goals Patient Stated Goal: go home to her dog OT Goal Formulation: With patient Time For Goal Achievement: 04/07/22 Potential to Achieve Goals: Fair ADL Goals Pt Will Perform Grooming: with modified independence Pt Will Perform Lower Body Dressing: with modified independence Pt Will Transfer to Toilet: with modified independence;ambulating Pt Will Perform Toileting - Clothing Manipulation and hygiene: with modified independence;sit to/from stand  OT Frequency: Min 2X/week    Co-evaluation              AM-PAC OT "6 Clicks" Daily Activity     Outcome Measure Help from another person eating meals?: None Help from another person taking care of personal grooming?:  None Help from another person toileting, which includes using toliet, bedpan, or urinal?: None Help from another person bathing (including washing, rinsing, drying)?: A Little Help from another person to put on and taking off regular upper body  clothing?: None Help from another person to put on and taking off regular lower body clothing?: None 6 Click Score: 23   End of Session Equipment Utilized During Treatment: Rolling walker (2 wheels) Nurse Communication: Mobility status (team re: recommendations for discharge)  Activity Tolerance: Patient tolerated treatment well Patient left: in chair;with call bell/phone within reach;with chair alarm set  OT Visit Diagnosis: Unsteadiness on feet (R26.81);Muscle weakness (generalized) (M62.81)                Time: 1359-1410 OT Time Calculation (min): 11 min Charges:  OT General Charges $OT Visit: 1 Visit OT Evaluation $OT Eval Low Complexity: 1 Low  Shanon Payor, OTD OTR/L  03/24/22, 3:29 PM

## 2022-03-24 NOTE — Hospital Course (Addendum)
Beth [name] is a 77 y.o. Caucasian female with medical history significant for anxiety, asthma, COPD, CVA, fibromyalgia, hypertension, dyslipidemia, IBS and vitamin D deficiency, who presented to the emergency room with onset of left arm and leg numbness and weakness with subsequent falls X3.  MRI of the brain did not show any evidence of stroke.  MRI neck did not show significant spinal stenosis.  Patient has been seen by Dr. Rory Percy from neurology, patient condition is not consistent with TIA or stroke.

## 2022-03-24 NOTE — Progress Notes (Signed)
SLP Cancellation Note  Patient Details Name: Beth [name] MRN: [id number] DOB: 17-Sep-1945   Cancelled treatment:       Reason Eval/Treat Not Completed: SLP screened, no needs identified, will sign off   SLP consult received and appreciated. Chart review completed. Per Neurology note, "Left-sided weakness-likely recrudescence of old stroke symptoms versus cervical cord pathology related... She is awake alert oriented x 3... No aphasia.Marland KitchenMarland KitchenNo dysarthria." SLP to sign off given the above.  Cherrie Gauze, M.S., [facility] [phone] [place])   Quintella Baton 03/24/2022, 10:08 AM

## 2022-03-24 NOTE — Assessment & Plan Note (Signed)
-   We will continue her albuterol and Singulair.

## 2022-03-24 NOTE — Plan of Care (Signed)
C-spine MRI unremarkable No new recommendations at this time Please see recommendations in the consult note from earlier this morning. Please call with questions as needed.  -- Amie Portland, MD Neurologist [facility] Pager: [phone]

## 2022-03-24 NOTE — Consult Note (Signed)
Neurology Consultation  Reason for Consult: Left-sided weakness Referring Physician: Dr. Eugenie Norrie  CC: Left-sided weakness  History is obtained from: Patient, chart  HPI: Beth [name] is a 77 y.o. female who has a past medical history of anxiety, depression, fibromyalgia, hypertension, hyperlipidemia, prior right thalamic/right basal ganglia stroke with some residual left-sided sensory deficits and off-and-on left-sided weakness, chronic pain, who presented to the emergency room for 2 to 3 days worth of shortness of breath, multiple falls and concern for worsening left-sided weakness.  She had been having symptoms for at least 2 to 3 days prior to presentation which was her last known well.  She told the ED providers that she felt like her left arm and leg have been weak and she has numbness on the left side of the body as well.  She has diffuse neck and back pain as well.  She uses a walker at baseline which she says is mostly for balance.  She is on an aspirin daily She was admitted to the hospitalist for persistent left-sided weakness evaluation.  MRI of the brain was negative for any acute stroke.  Concern was for a TIA but I doubt that the MRI would be negative for 2 to 3 days worth of florid left-sided weakness, so I do not believe this is a stroke or a TIA. She has had neck surgery in the past that she does not remember when. She reports of persistent and worsening neck pain as well.   LKW: At least 3 days ago IV thrombolysis given?: no, outside the window Premorbid modified Rankin scale (mRS): 3 at least   ROS: Full ROS was performed and is negative except as noted in the HPI.    Past Medical History:  Diagnosis Date   Allergy    Anxiety    Asthma    Chronic pain syndrome    discharged from pain clinic, hx of narcotics seeking behavior   COPD (chronic obstructive pulmonary disease) (HCC)    CVA (cerebral infarction)    Depression    Fibromyalgia    Headache     Hyperlipidemia    Hypertension    IBS (irritable bowel syndrome)    Stroke ([place])    Vitamin D deficiency    Family History  Problem Relation Age of Onset   Anxiety disorder Mother    Depression Mother    Breast cancer Mother 33   Cancer Father    Gallbladder disease Father    Alcohol abuse Father    Depression Father    Bipolar disorder Son    Social History:   reports that she quit smoking about 16 months ago. Her smoking use included cigarettes. She has a 14.25 pack-year smoking history. She has never used smokeless tobacco. She reports that she does not drink alcohol and does not use drugs.  Medications  Current Facility-Administered Medications:    [START ON 03/25/2022]  stroke: early stages of recovery book, , Does not apply, Once, Mansy, Jan A, MD   0.9 %  sodium chloride infusion, , Intravenous, Continuous, Mansy, Jan A, MD   acetaminophen (TYLENOL) tablet 650 mg, 650 mg, Oral, Q6H PRN **OR** acetaminophen (TYLENOL) suppository 650 mg, 650 mg, Rectal, Q6H PRN, Mansy, Jan A, MD   albuterol (PROVENTIL) (2.5 MG/3ML) 0.083% nebulizer solution 2.5 mg, 2.5 mg, Nebulization, Q6H PRN, Renda Rolls, RPH   aspirin chewable tablet 81 mg, 81 mg, Oral, Daily, Mansy, Jan A, MD   atorvastatin (LIPITOR) tablet 40 mg, 40  mg, Oral, Daily, Mansy, Jan A, MD   baclofen (LIORESAL) tablet 5-10 mg, 5-10 mg, Oral, TID PRN, Mansy, Jan A, MD   enoxaparin (LOVENOX) injection 40 mg, 40 mg, Subcutaneous, Q24H, Mansy, Jan A, MD   FLUoxetine (PROZAC) capsule 20 mg, 20 mg, Oral, Daily, Mansy, Jan A, MD   gabapentin (NEURONTIN) capsule 300 mg, 300 mg, Oral, TID, Mansy, Jan A, MD   magnesium hydroxide (MILK OF MAGNESIA) suspension 30 mL, 30 mL, Oral, Daily PRN, Mansy, Jan A, MD   melatonin tablet 10 mg, 10 mg, Oral, QHS PRN, Mansy, Jan A, MD   metoprolol succinate (TOPROL-XL) 24 hr tablet 12.5 mg, 12.5 mg, Oral, Daily, Mansy, Jan A, MD   montelukast (SINGULAIR) tablet 10 mg, 10 mg, Oral, QHS, Mansy, Jan A,  MD   multivitamin with minerals tablet 1 tablet, 1 tablet, Oral, Daily, Mansy, Jan A, MD   ondansetron (ZOFRAN) tablet 4 mg, 4 mg, Oral, Q6H PRN **OR** ondansetron (ZOFRAN) injection 4 mg, 4 mg, Intravenous, Q6H PRN, Mansy, Jan A, MD   potassium chloride (KLOR-CON M) CR tablet 10 mEq, 10 mEq, Oral, Daily, Mansy, Jan A, MD   QUEtiapine (SEROQUEL) tablet 250 mg, 250 mg, Oral, QHS, Belue, Nathan S, RPH   traZODone (DESYREL) tablet 25 mg, 25 mg, Oral, QHS PRN, Mansy, Arvella Merles, MD  Exam: Current vital signs: BP 124/61 (BP Location: Left Arm)   Pulse 63   Temp 97.6 F (36.4 C) (Oral)   Resp 18   Ht '5\' 3"'$  (1.6 m)   Wt 65.8 kg   SpO2 96%   BMI 25.70 kg/m  Vital signs in last 24 hours: Temp:  [97.6 F (36.4 C)-98.2 F (36.8 C)] 97.6 F (36.4 C) (02/25 0634) Pulse Rate:  [63-73] 63 (02/25 0634) Resp:  [17-20] 18 (02/25 0634) BP: (124-138)/(57-70) 124/61 (02/25 0634) SpO2:  [96 %-100 %] 96 % (02/25 0634) Weight:  [65.8 kg] 65.8 kg (02/25 0600) General: Awake alert in no distress HEENT: Normocephalic atraumatic Lungs: Clear Cardiovascular: Regular rhythm Abdomen nondistended nontender Extremities warm well-perfused Neurological exam She is awake alert oriented x 3 No aphasia No dysarthria Cranial nerves II to XII intact Motor examination with somewhat of effort dependent/pain dependent left upper extremity drift.  Mild weakness in the left lower extremity in the same pattern of the left upper extremity.  Right side is full strength with mild tremulousness of both upper and lower extremities. Sensation intact Coordination with no gross dysmetria Gait testing was deferred NIH stroke scale-2-1 for leg drift, 1 for arm drift on the left   Labs I have reviewed labs in epic and the results pertinent to this consultation are: CBC    Component Value Date/Time   WBC 12.2 (H) 03/24/2022 0510   RBC 3.38 (L) 03/24/2022 0510   HGB 10.2 (L) 03/24/2022 0510   HGB 12.2 12/25/2012 1449   HCT  32.4 (L) 03/24/2022 0510   HCT 36.1 12/25/2012 1449   PLT 198 03/24/2022 0510   PLT 166 12/29/2012 0553   MCV 95.9 03/24/2022 0510   MCV 88 12/25/2012 1449   MCH 30.2 03/24/2022 0510   MCHC 31.5 03/24/2022 0510   RDW 15.5 03/24/2022 0510   RDW 15.6 (H) 12/25/2012 1449   LYMPHSABS 1.7 03/23/2022 2346   LYMPHSABS 2.6 12/25/2012 1449   MONOABS 1.0 03/23/2022 2346   MONOABS 0.4 12/25/2012 1449   EOSABS 0.3 03/23/2022 2346   EOSABS 0.1 12/25/2012 1449   BASOSABS 0.0 03/23/2022 2346   BASOSABS 0.1  12/25/2012 1449    CMP     Component Value Date/Time   NA 139 03/24/2022 0510   NA 143 01/01/2021 1526   NA 144 12/26/2012 0520   K 3.9 03/24/2022 0510   K 3.3 (L) 12/26/2012 0520   CL 104 03/24/2022 0510   CL 114 (H) 12/26/2012 0520   CO2 28 03/24/2022 0510   CO2 24 12/26/2012 0520   GLUCOSE 102 (H) 03/24/2022 0510   GLUCOSE 85 12/26/2012 0520   BUN 23 03/24/2022 0510   BUN 19 01/01/2021 1526   BUN 13 12/26/2012 0520   CREATININE 1.11 (H) 03/24/2022 0510   CREATININE 0.99 09/21/2021 1413   CALCIUM 8.6 (L) 03/24/2022 0510   CALCIUM 9.1 12/26/2012 0520   PROT 6.2 (L) 03/23/2022 2346   PROT 6.1 03/23/2015 1536   PROT 7.8 12/20/2012 0949   ALBUMIN 3.1 (L) 03/23/2022 2346   ALBUMIN 3.6 03/23/2015 1536   ALBUMIN 3.5 12/20/2012 0949   AST 39 03/23/2022 2346   AST 23 12/20/2012 0949   ALT 39 03/23/2022 2346   ALT 14 12/20/2012 0949   ALKPHOS 103 03/23/2022 2346   ALKPHOS 93 12/20/2012 0949   BILITOT 0.8 03/23/2022 2346   BILITOT 0.3 03/23/2015 1536   BILITOT 0.3 12/20/2012 0949   GFRNONAA 51 (L) 03/24/2022 0510   GFRNONAA 55 (L) 01/14/2020 1144   GFRAA 63 01/14/2020 1144    Imaging I have reviewed the images obtained:  MR brain without contrast-negative for acute stroke.  Chronic right basal ganglia infarct.  Assessment:  77 year old woman with above past medical history presenting for 2 to 3 days history of worsening falls and possible left-sided weakness.  MRI is  negative for acute infarct although it shows chronic right basal ganglia/thalamic infarct. I suspect that this is recrudescence of her old stroke symptoms most likely but given negative brain MRI, it is prudent to look at the C-spine to evaluate for any cervical cord compression or radiculopathy that might be contributing to this weakness. I do not think she suffered a TIA or a stroke given that her symptoms are persistent and the MRI is negative which is unlikely for a TIA and stroke both.  Impression: Left-sided weakness-likely recrudescence of old stroke symptoms versus cervical cord pathology related.  Recommendations: I do not think she needs much in terms of a stroke workup but one has been ordered by the medicine team, I would recommend the hospitalist follow that. C-spine MRI-if shows any evidence of compression or radiculopathy, neurosurgery will need to be consulted From a pure neurological standpoint, she needs to be followed up outpatient given the history of stroke-follow-up with outpatient neurology in 8 to 12 weeks. She is on an aspirin daily at home-continue that for now. She is also on statin at home-continue for now. Plan was discussed with Dr. Roosevelt Locks. Neurology will be available as needed-please call with questions.  -- Amie Portland, MD Neurologist [facility] Pager: [phone]

## 2022-03-24 NOTE — Assessment & Plan Note (Addendum)
-   This is likely associated with transient chemical attack given negative MRI.  Differential diagnosis would include cervical radiculopathy. - The patient will be admitted to an observation medically monitored bed.   - We will follow neuro checks q.4 hours for 24 hours.   - The patient will be placed on aspirin.   - Will obtain  2D echo with bubble study .   - A neurology consultation  as well as physical/occupation/speech therapy consults will be obtained in a.m.Marland Kitchen - I notified Dr. Rory Percy about the patient. - We will obtain a C-spine MRI. - The patient will be placed on statin therapy and fasting lipids will be checked.

## 2022-03-24 NOTE — Evaluation (Addendum)
Physical Therapy Evaluation Patient Details Name: Beth [name] MRN: [id number] DOB: November 28, 1945 Today's Date: 03/24/2022  History of Present Illness  Pt is a 77 y/o F admitted on 03/24/23 after presenting with c/o L sided numbness & weakness with 3 subsequent falls along with L sided facial droop. MRI was negative for acute intracranial abnormality. PMH: anxiety, asthma, COPD, CVA, HTN, dyslipidemia, IBS, vitamin D deficiency  Clinical Impression  Pt seen for PT evaluation with pt agreeable. Pt reports prior to admission she was living alone in a 1 level apartment with level entry, ambulatory with rollator, but does note 3 falls in 1 day prior to admission. On this date, pt is able to complete bed mobility with mod I, STS with supervision, & ambulate 1 lap around nurses station with RW & CGA. Pt presents with slight tremors but notes this is baseline. Pt reports her son does not work & can assist at home. Recommend HHPT f/u upon d/c.   Addendum: Per case manager, pt's son can provide limited supervision 2/2 personal medical issues. At this time, updated d/c recommendations to SNF as pt would benefit from STR upon d/c from acute setting to facilitate return to mod I PLOF, prior to return home alone.   Recommendations for follow up therapy are one component of a multi-disciplinary discharge planning process, led by the attending physician.  Recommendations may be updated based on patient status, additional functional criteria and insurance authorization.  Follow Up Recommendations Skilled nursing-short term rehab (<3 hours/day) Can patient physically be transported by private vehicle: Yes    Assistance Recommended at Discharge Intermittent Supervision/Assistance  Patient can return home with the following  Assistance with cooking/housework;A little help with bathing/dressing/bathroom;Assist for transportation;Help with stairs or ramp for entrance    Equipment Recommendations Rolling walker (2  wheels)  Recommendations for Other Services       Functional Status Assessment Patient has had a recent decline in their functional status and demonstrates the ability to make significant improvements in function in a reasonable and predictable amount of time.     Precautions / Restrictions Precautions Precautions: Fall Restrictions Weight Bearing Restrictions: No      Mobility  Bed Mobility Overal bed mobility: Modified Independent             General bed mobility comments: supine>sit with HOB elevated    Transfers Overall transfer level: Needs assistance Equipment used: Rolling walker (2 wheels) Transfers: Sit to/from Stand Sit to Stand: Supervision                Ambulation/Gait Ambulation/Gait assistance: Min guard Gait Distance (Feet): 175 Feet Assistive device: Rolling walker (2 wheels) Gait Pattern/deviations: Decreased dorsiflexion - right, Decreased dorsiflexion - left, Decreased step length - left, Decreased step length - right Gait velocity: slightly decreased     General Gait Details: slightly increased BLE hip flexion to compensate for decreased BLE dorsiflexion & heel strike BLE.  Stairs            Wheelchair Mobility    Modified Rankin (Stroke Patients Only)       Balance Overall balance assessment: Needs assistance Sitting-balance support: Feet supported Sitting balance-Leahy Scale: Good     Standing balance support: During functional activity, Bilateral upper extremity supported Standing balance-Leahy Scale: Fair Standing balance comment: No overt LOB during standing/gait.                             Pertinent Vitals/Pain  Pain Assessment Pain Assessment: Faces Faces Pain Scale: Hurts a little bit Pain Location: chronic back pain Pain Descriptors / Indicators: Discomfort Pain Intervention(s): Monitored during session    Home Living Family/patient expects to be discharged to:: Private residence Living  Arrangements: Alone Available Help at Discharge: Family;Available PRN/intermittently Type of Home: Apartment Home Access: Level entry       Home Layout: One level Home Equipment: Rollator (4 wheels)      Prior Function Prior Level of Function : Independent/Modified Independent;Driving             Mobility Comments: Pt reports she's mod I with rollator but does endorse ~3 falls in 1 day prior to admission. Reports she walks her dog on a leash at home. ADLs Comments: Cooks some.     Hand Dominance        Extremity/Trunk Assessment   Upper Extremity Assessment Upper Extremity Assessment: Overall WFL for tasks assessed    Lower Extremity Assessment Lower Extremity Assessment: Generalized weakness    Cervical / Trunk Assessment Cervical / Trunk Assessment:  (pt with slight BUE/BLE tremors but notes this is baseline)  Communication   Communication: No difficulties  Cognition Arousal/Alertness: Awake/alert Behavior During Therapy: WFL for tasks assessed/performed Overall Cognitive Status: Within Functional Limits for tasks assessed                                          General Comments      Exercises     Assessment/Plan    PT Assessment Patient needs continued PT services  PT Problem List Decreased strength;Decreased activity tolerance;Decreased balance;Decreased mobility;Decreased knowledge of use of DME;Decreased safety awareness       PT Treatment Interventions DME instruction;Therapeutic exercise;Gait training;Balance training;Neuromuscular re-education;Functional mobility training;Therapeutic activities;Patient/family education    PT Goals (Current goals can be found in the Care Plan section)  Acute Rehab PT Goals Patient Stated Goal: go home PT Goal Formulation: With patient Time For Goal Achievement: 04/07/22 Potential to Achieve Goals: Good    Frequency Min 2X/week     Co-evaluation               AM-PAC PT "6  Clicks" Mobility  Outcome Measure Help needed turning from your back to your side while in a flat bed without using bedrails?: None Help needed moving from lying on your back to sitting on the side of a flat bed without using bedrails?: A Little Help needed moving to and from a bed to a chair (including a wheelchair)?: A Little Help needed standing up from a chair using your arms (e.g., wheelchair or bedside chair)?: A Little Help needed to walk in hospital room?: A Little Help needed climbing 3-5 steps with a railing? : A Little 6 Click Score: 19    End of Session   Activity Tolerance: Patient tolerated treatment well Patient left: in chair (in handoff to OT) Nurse Communication: Mobility status PT Visit Diagnosis: History of falling (Z91.81);Muscle weakness (generalized) (M62.81)    Time: [id number] PT Time Calculation (min) (ACUTE ONLY): 12 min   Charges:   PT Evaluation $PT Eval Low Complexity: H[place]ard, PT, DPT 03/24/22, 2:41 PM   Waunita Schooner 03/24/2022, 2:35 PM

## 2022-03-24 NOTE — Assessment & Plan Note (Signed)
-   We will continue Xanax, Prozac and Seroquel.

## 2022-03-24 NOTE — Progress Notes (Signed)
OT Cancellation Note  Patient Details Name: Beth [name] MRN: [id number] DOB: December 10, 1945   Cancelled Treatment:    Reason Eval/Treat Not Completed: Other (comment) (pt off the floor for imaging, OT will reattempt as able)  Shanon Payor, OTD OTR/L  03/24/22, 12:23 PM

## 2022-03-24 NOTE — Progress Notes (Signed)
Pt currently having Echo performed at pt bedside; unable to perform assessment or medication pass at present

## 2022-03-24 NOTE — Progress Notes (Signed)
Progress Note   Patient: Beth [name] [id number] DOB: 27-Jun-1945 DOA: 03/23/2022     0 DOS: the patient was seen and examined on 03/24/2022   Brief hospital course: SHULAMITH LORY is a 77 y.o. Caucasian female with medical history significant for anxiety, asthma, COPD, CVA, fibromyalgia, hypertension, dyslipidemia, IBS and vitamin D deficiency, who presented to the emergency room with onset of left arm and leg numbness and weakness with subsequent falls X3.  MRI of the brain did not show any evidence of stroke.  MRI neck did not show significant spinal stenosis.  Patient has been seen by Dr. Rory Percy from neurology, patient condition is not consistent with TIA or stroke.      Principal Problem:   Acute left-sided weakness Active Problems:   Dyslipidemia   Low back pain with radiation   Anxiety and depression   Asthma with COPD   TIA (transient ischemic attack)   Multiple falls   Severe pulmonary arterial systolic hypertension (HCC)   Moderate mitral regurgitation   Moderate tricuspid regurgitation   Moderate aortic valve stenosis   Assessment and Plan: Left-sided weakness. Reviewed MRI of the brain and cervical spine, patient does not have acute stroke, no spinal stenosis.  Discussed with neurology, patient does not have a TIA. Patient also has been seen by PT/OT, still debating a safe discharge option.  I will keep patient overnight, TOC will figure out if home health vs SNF is needed.  Weakness could be secondary to fibromyalgia the patient has in the past.  Severe pulmonary hypertension. Moderate to severe tricuspid regurgitation. Moderate mitral regurgitation. Prior echocardiogram showed severe pulmonary hypertension, which is not measured in this echocardiogram.  Both echocardiogram showed moderate to severe tricuspid regurgitation, mild to moderate mitral regurgitation.  However, aortic stenosis was not present at this echocardiogram. Clinically, patient has no  exacerbation of congestive heart failure.  BNP not significantly elevated.  CT scan of the chest suspected volume overload, which is not present clinically.  COPD. Stable.  Anxiety and depression. Continue home medicines  Chronic back pain with radiation. Symptomatic treatment medically  Bipolar depression. Continue home medicine.         Subjective: Patient still complaining of left-sided weakness, was able to ambulate with physical therapy.  Physical Exam: Vitals:   03/24/22 0511 03/24/22 0600 03/24/22 0634 03/24/22 0958  BP: (!) 127/58  124/61 (!) 137/54  Pulse: 71  63 65  Resp: '20  18 16  '$ Temp: 97.6 F (36.4 C)  97.6 F (36.4 C) 98 F (36.7 C)  TempSrc: Oral  Oral   SpO2: 100%  96% 96%  Weight:  65.8 kg    Height:  '5\' 3"'$  (1.6 m)     General exam: Appears calm and comfortable  Respiratory system: Decreased breath sounds. Respiratory effort normal. Cardiovascular system: S1 & S2 heard, RRR. No JVD, murmurs, rubs, gallops or clicks. No pedal edema. Gastrointestinal system: Abdomen is nondistended, soft and nontender. No organomegaly or masses felt. Normal bowel sounds heard. Central nervous system: Alert and oriented. No focal neurological deficits. Extremities: Symmetric 5 x 5 power. Skin: No rashes, lesions or ulcers Psychiatry: Judgement and insight appear normal. Mood & affect appropriate.    Data Reviewed:  Labs reviewed, chest x-ray reviewed, MRI results reviewed.  Family Communication: Son updated at bedside  Disposition: Status is: Observation      Time spent: 35 minutes  Author: Sharen Hones, MD 03/24/2022 2:41 PM  For on call review [email or web address].

## 2022-03-24 NOTE — Assessment & Plan Note (Signed)
She will continue prednisone taper.

## 2022-03-24 NOTE — Progress Notes (Signed)
   Echocardiogram 2D Echocardiogram has been performed. A Saline Microcavitation (Bubble Study) was requested and performedon this study.  Beth [name] 03/24/2022, 9:06 AM

## 2022-03-25 ENCOUNTER — Telehealth: Payer: Self-pay | Admitting: Family Medicine

## 2022-03-25 DIAGNOSIS — I2721 Secondary pulmonary arterial hypertension: Secondary | ICD-10-CM

## 2022-03-25 DIAGNOSIS — I340 Nonrheumatic mitral (valve) insufficiency: Secondary | ICD-10-CM

## 2022-03-25 DIAGNOSIS — R531 Weakness: Secondary | ICD-10-CM | POA: Diagnosis not present

## 2022-03-25 LAB — HEMOGLOBIN A1C
Hgb A1c MFr Bld: 5.9 % — ABNORMAL HIGH (ref 4.8–5.6)
Mean Plasma Glucose: 123 mg/dL

## 2022-03-25 NOTE — TOC Transition Note (Addendum)
Transition of Care [facility]) - CM/SW Discharge Note   Patient Details  Name: Beth [name] MRN: [id number] Date of Birth: Jun 21, 1945  [facility]) CM/SW Contact:  Gerilyn Pilgrim, LCSW Phone Number: 03/25/2022, 10:12 AM   Clinical Narrative:  Pt has orders to discharge. [facility] accepted for [facility] PT/OT/AID/SW. CSW signing off.       Barriers to Discharge: Barriers Resolved   Patient Goals and CMS Choice      Discharge Placement                         Discharge Plan and Services Additional resources added to the After Visit Summary for                                       Social Determinants of Health (SDOH) Interventions SDOH Screenings   Food Insecurity: No Food Insecurity (02/01/2022)  Housing: Low Risk  (02/01/2022)  Transportation Needs: No Transportation Needs (02/01/2022)  Utilities: Not At Risk (02/01/2022)  Alcohol Screen: Low Risk  (02/01/2022)  Depression (PHQ2-9): Medium Risk (03/15/2022)  Financial Resource Strain: Low Risk  (02/01/2022)  Physical Activity: Insufficiently Active (02/01/2022)  Social Connections: Socially Isolated (02/01/2022)  Stress: No Stress Concern Present (02/01/2022)  Tobacco Use: Medium Risk (03/24/2022)     Readmission Risk Interventions     No data to display

## 2022-03-25 NOTE — Telephone Encounter (Signed)
Need more information before I am able to advise further.  I do not see that patient is scheduled yet for hospital follow-up apt.  She was recommended to be discharged to a skilled nursing facility, but she and family declined this option. They chose to go home with home health.  Usually hospital team will order home health.  I am not sure if it was ordered or not.  If it was not ordered by hospital, usually a hospital follow up apt is needed for me to sign orders.  Nobie Putnam, [facility] 03/25/2022, 1:02 PM

## 2022-03-25 NOTE — Discharge Summary (Addendum)
Physician Discharge Summary   Patient: Beth [name] MRN: [id number] DOB: 1945/06/30  Admit date:     03/23/2022  Discharge date: 03/25/22  Discharge Physician: Sharen Hones   PCP: [name], DO   Recommendations at discharge:   Follow-up with PCP in 1 week.  Discharge Diagnoses: Principal Problem:   Acute left-sided weakness Active Problems:   Dyslipidemia   Low back pain with radiation   Anxiety and depression   Asthma with COPD   TIA (transient ischemic attack)   Multiple falls   Severe pulmonary arterial systolic hypertension (HCC)   Moderate mitral regurgitation   Moderate tricuspid regurgitation   Moderate aortic valve stenosis Chronic kidney disease stage IIIa. TIA ruled out.  Resolved Problems:   * No resolved hospital problems. [facility] Course: Beth [name] is a 77 y.o. Caucasian female with medical history significant for anxiety, asthma, COPD, CVA, fibromyalgia, hypertension, dyslipidemia, IBS and vitamin D deficiency, who presented to the emergency room with onset of left arm and leg numbness and weakness with subsequent falls X3.  MRI of the brain did not show any evidence of stroke.  MRI neck did not show significant spinal stenosis.  Patient has been seen by Dr. Rory Percy from neurology, patient condition is not consistent with TIA or stroke.     Assessment and Plan: Left-sided weakness. Reviewed MRI of the brain and cervical spine, patient does not have acute stroke, no spinal stenosis.  Discussed with neurology, patient does not have a TIA. Weakness could be secondary to fibromyalgia the patient has in the past. Patient has been seen by PT/OT, recommended nursing home placement.  However, patient and son refused to go to nursing home.  Discussed with TOC, will try to set up home care.  Severe pulmonary hypertension. Moderate to severe tricuspid regurgitation. Moderate mitral regurgitation. Prior echocardiogram showed severe pulmonary  hypertension, which is not measured in this echocardiogram.  Both echocardiogram showed moderate to severe tricuspid regurgitation, mild to moderate mitral regurgitation.  However, aortic stenosis was not present at this echocardiogram. Clinically, patient has no exacerbation of congestive heart failure.  BNP not significantly elevated.  CT scan of the chest suspected volume overload, which is not present clinically.  Resume home treatment.   COPD. Stable.   Anxiety and depression. Continue home medicines   Chronic back pain with radiation. Resume home treatment.   Bipolar depression. Continue home medicine.       Consultants: None Procedures performed: None  Disposition: Home health Diet recommendation:  Discharge Diet Orders (From admission, onward)     Start     Ordered   03/25/22 0000  Diet - low sodium heart healthy        03/25/22 1006           Cardiac diet DISCHARGE MEDICATION: Allergies as of 03/25/2022       Reactions   Bextra  [valdecoxib]    Compazine [prochlorperazine Edisylate]    Stroke-like symptoms   Lithium Carbonate    Leg weakness   Lyrica [pregabalin]         Medication List     STOP taking these medications    multivitamin-iron-minerals-folic acid chewable tablet   predniSONE 20 MG tablet Commonly known as: DELTASONE       TAKE these medications    acetaminophen 500 MG tablet Commonly known as: TYLENOL Take 500 mg by mouth 2 (two) times daily as needed.   albuterol 108 (90 Base) MCG/ACT inhaler Commonly known as:  VENTOLIN HFA Inhale 1-2 puffs into the lungs every 6 (six) hours as needed for wheezing or shortness of breath.   aspirin 81 MG chewable tablet Chew 81 mg by mouth daily.   atorvastatin 40 MG tablet Commonly known as: LIPITOR TAKE 1 TABLET BY MOUTH EVERY DAY   baclofen 10 MG tablet Commonly known as: LIORESAL Take 0.5-1 tablets (5-10 mg total) by mouth 3 (three) times daily as needed for muscle spasms.    FLUoxetine 20 MG capsule Commonly known as: PROZAC TAKE 3 CAPSULES(60 MG) BY MOUTH DAILY   furosemide 20 MG tablet Commonly known as: LASIX Take 2 tablets (40 mg total) by mouth daily. If swelling is less, can take only 1 pill.   gabapentin 300 MG capsule Commonly known as: Neurontin Take 1 capsule (300 mg total) by mouth 3 (three) times daily. What changed: Another medication with the same name was removed. Continue taking this medication, and follow the directions you see here.   LORazepam 0.5 MG tablet Commonly known as: ATIVAN Take 1 tablet (0.5 mg total) by mouth every other day. Start taking on: March 29, 2022   Melatonin 3 MG Caps Take 10 mg by mouth at bedtime as needed (sleep).   metoprolol succinate 25 MG 24 hr tablet Commonly known as: TOPROL-XL TAKE 1/2 TABLET(12.5 MG) BY MOUTH DAILY   montelukast 10 MG tablet Commonly known as: SINGULAIR TAKE 1 TABLET(10 MG) BY MOUTH AT BEDTIME   potassium chloride 10 MEQ tablet Commonly known as: KLOR-CON Take 1 tablet (10 mEq total) by mouth daily.   QUEtiapine 50 MG tablet Commonly known as: SEROQUEL Take 1 tablet (50 mg total) by mouth at bedtime. Total of 250 mg at night. Take along with 200 mg tab   QUEtiapine 200 MG tablet Commonly known as: SEROQUEL Take 1 tablet (200 mg total) by mouth at bedtime. Take total of 250 mg. Take along with 50 mg tab   Trelegy Ellipta 200-62.5-25 MCG/ACT Aepb Generic drug: Fluticasone-Umeclidin-Vilant INHALE 1 PUFF INTO THE LUNGS DAILY               Durable Medical Equipment  (From admission, onward)           Start     Ordered   03/24/22 1530  For home use only DME 3 n 1  Once        03/24/22 1529            Follow-up Information     [name], DO Follow up in 1 week(s).   Specialty: Family Medicine Contact information: [address] [place] [address] [phone]                Discharge Exam: Danley Danker Weights   03/24/22 0600   Weight: 65.8 kg   General exam: Appears calm and comfortable  Respiratory system: Clear to auscultation. Respiratory effort normal. Cardiovascular system: S1 & S2 heard, RRR. No JVD, murmurs, rubs, gallops or clicks. No pedal edema. Gastrointestinal system: Abdomen is nondistended, soft and nontender. No organomegaly or masses felt. Normal bowel sounds heard. Central nervous system: Alert and oriented. No focal neurological deficits. Extremities: Symmetric 5 x 5 power. Skin: No rashes, lesions or ulcers Psychiatry: Judgement and insight appear normal. Mood & affect appropriate.    Condition at discharge: fair  The results of significant diagnostics from this hospitalization (including imaging, microbiology, ancillary and laboratory) are listed below for reference.   Imaging Studies: ECHOCARDIOGRAM COMPLETE BUBBLE STUDY  Result Date: 03/24/2022  ECHOCARDIOGRAM REPORT   Patient Name:   SUNDAI GOSHA Date of Exam: 03/24/2022 Medical Rec #:  [id number]        Height:       63.0 in Accession #:    [id number]       Weight:       145.1 lb Date of Birth:  [birth year]        BSA:          1.687 m Patient Age:    77 years         BP:           124/61 mmHg Patient Gender: F                HR:           63 bpm. Exam Location:  [facility] Procedure: 2D Echo and Saline Contrast Bubble Study Indications:     Stroke I63.9  History:         Patient has prior history of Echocardiogram examinations, most                  recent 10/11/2020.  Sonographer:     Kathlen Brunswick Referring Phys:  [id number] Dickinson Diagnosing Phys: Isaias Cowman MD IMPRESSIONS  1. Left ventricular ejection fraction, by estimation, is 60 to 65%. The left ventricle has normal function. The left ventricle has no regional wall motion abnormalities. Left ventricular diastolic parameters were normal.  2. Right ventricular systolic function is normal. The right ventricular size is normal.  3. The mitral valve is normal in structure.  Mild to moderate mitral valve regurgitation. No evidence of mitral stenosis.  4. Tricuspid valve regurgitation is moderate to severe.  5. The aortic valve is normal in structure. Aortic valve regurgitation is mild. No aortic stenosis is present.  6. The inferior vena cava is normal in size with greater than 50% respiratory variability, suggesting right atrial pressure of 3 mmHg.  7. Agitated saline contrast bubble study was negative, with no evidence of any interatrial shunt. FINDINGS  Left Ventricle: Left ventricular ejection fraction, by estimation, is 60 to 65%. The left ventricle has normal function. The left ventricle has no regional wall motion abnormalities. The left ventricular internal cavity size was normal in size. There is  no left ventricular hypertrophy. Left ventricular diastolic parameters were normal. Right Ventricle: The right ventricular size is normal. No increase in right ventricular wall thickness. Right ventricular systolic function is normal. Left Atrium: Left atrial size was normal in size. Right Atrium: Right atrial size was normal in size. Pericardium: There is no evidence of pericardial effusion. Mitral Valve: The mitral valve is normal in structure. Mild to moderate mitral valve regurgitation. No evidence of mitral valve stenosis. Tricuspid Valve: The tricuspid valve is normal in structure. Tricuspid valve regurgitation is moderate to severe. Aortic Valve: The aortic valve is normal in structure. Aortic valve regurgitation is mild. Aortic regurgitation PHT measures 695 msec. No aortic stenosis is present. Aortic valve mean gradient measures 6.0 mmHg. Aortic valve peak gradient measures 11.9 mmHg. Aortic valve area, by VTI measures 1.43 cm. Pulmonic Valve: The pulmonic valve was normal in structure. Pulmonic valve regurgitation is not visualized. No evidence of pulmonic stenosis. Aorta: The aortic root is normal in size and structure. Venous: The inferior vena cava is normal in size with  greater than 50% respiratory variability, suggesting right atrial pressure of 3 mmHg. IAS/Shunts: No atrial level shunt detected by color flow Doppler. Agitated  saline contrast was given intravenously to evaluate for intracardiac shunting. Agitated saline contrast bubble study was negative, with no evidence of any interatrial shunt.  LEFT VENTRICLE PLAX 2D LVIDd:         4.50 cm   Diastology LVIDs:         3.10 cm   LV e' medial:    10.90 cm/s LV PW:         1.10 cm   LV E/e' medial:  11.7 LV IVS:        0.90 cm   LV e' lateral:   16.40 cm/s LVOT diam:     1.80 cm   LV E/e' lateral: 7.8 LV SV:         61 LV SV Index:   36 LVOT Area:     2.54 cm  RIGHT VENTRICLE RV Basal diam:  2.20 cm RV S prime:     16.30 cm/s TAPSE (M-mode): 2.6 cm LEFT ATRIUM             Index        RIGHT ATRIUM           Index LA diam:        4.40 cm 2.61 cm/m   RA Area:     16.10 cm LA Vol (A2C):   89.4 ml 53.00 ml/m  RA Volume:   39.50 ml  23.42 ml/m LA Vol (A4C):   55.7 ml 33.02 ml/m LA Biplane Vol: 73.9 ml 43.81 ml/m  AORTIC VALVE                     PULMONIC VALVE AV Area (Vmax):    1.44 cm      PV Vmax:        0.91 m/s AV Area (Vmean):   1.46 cm      PV Peak grad:   3.3 mmHg AV Area (VTI):     1.43 cm      RVOT Peak grad: 2 mmHg AV Vmax:           172.50 cm/s AV Vmean:          115.000 cm/s AV VTI:            0.429 m AV Peak Grad:      11.9 mmHg AV Mean Grad:      6.0 mmHg LVOT Vmax:         97.40 cm/s LVOT Vmean:        66.100 cm/s LVOT VTI:          0.241 m LVOT/AV VTI ratio: 0.56 AI PHT:            695 msec  AORTA Ao Root diam: 3.20 cm Ao Asc diam:  3.00 cm MITRAL VALVE                  TRICUSPID VALVE MV Area (PHT): 3.74 cm       TR Peak grad:   57.2 mmHg MV Decel Time: 203 msec       TR Vmax:        378.00 cm/s MR Peak grad:    133.6 mmHg MR Mean grad:    89.0 mmHg    SHUNTS MR Vmax:         578.00 cm/s  Systemic VTI:  0.24 m MR Vmean:        447.0 cm/s   Systemic Diam: 1.80 cm MR PISA:  10.62 cm MR PISA Eff ROA:  57 mm MR PISA Radius:  1.30 cm MV E velocity: 128.00 cm/s MV A velocity: 78.60 cm/s MV E/A ratio:  1.63 Isaias Cowman MD Electronically signed by Isaias Cowman MD Signature Date/Time: 03/24/2022/12:55:40 PM    Final    MR CERVICAL SPINE W WO CONTRAST  Result Date: 03/24/2022 CLINICAL DATA:  Cervical radiculopathy.  Left-sided weakness. EXAM: MRI CERVICAL SPINE WITHOUT AND WITH CONTRAST TECHNIQUE: Multiplanar and multiecho pulse sequences of the cervical spine, to include the craniocervical junction and cervicothoracic junction, were obtained without and with intravenous contrast. CONTRAST:  7.49m GADAVIST GADOBUTROL 1 MMOL/ML IV SOLN COMPARISON:  CT C Spine 03/24/22 FINDINGS: Alignment: Straightening of the normal cervical lordosis. Vertebrae: Status post C5-C7 ACDF. No evidence of fracture, discitis, or bone lesion. Cord: Normal signal and morphology. Posterior Fossa, vertebral arteries, paraspinal tissues: Negative. Disc levels: C1-C2: Mild degenerative change. C2-C3: Mild bilateral facet degenerative change. Minimal disc bulge. No significant spinal canal stenosis. Mild right neural foraminal narrowing. C3-C4: Mild-to-moderate right and mild left facet degenerative change. Circumferential disc bulge. No significant spinal canal narrowing. No neural foraminal narrowing. C4-C5: Moderate bilateral facet degenerative change. Minimal disc bulge. No spinal canal narrowing. No neural foraminal narrowing. C5-C6: Fused level. No spinal canal stenosis. No neural foraminal stenosis. C6-C7: Fused level. No significant spinal canal stenosis. Mild right neural foraminal stenosis. C7-T1: Mild bilateral facet degenerative change. No significant spinal canal stenosis. Neural foraminal stenosis. IMPRESSION: 1. Status post C5-C7 ACDF without evidence of hardware complications or junctional level degenerative change. 2. No significant spinal canal or neuroforaminal stenosis at any level. Electronically Signed   By:  HMarin RobertsM.D.   On: 03/24/2022 12:16   CT ANGIO HEAD NECK W WO CM  Result Date: 03/24/2022 CLINICAL DATA:  77year old female with neurologic deficit. Chronic small vessel disease on MRI. EXAM: CT ANGIOGRAPHY HEAD AND NECK TECHNIQUE: Multidetector CT imaging of the head and neck was performed using the standard protocol during bolus administration of intravenous contrast. Multiplanar CT image reconstructions and MIPs were obtained to evaluate the vascular anatomy. Carotid stenosis measurements (when applicable) are obtained utilizing NASCET criteria, using the distal internal carotid diameter as the denominator. RADIATION DOSE REDUCTION: This exam was performed according to the departmental dose-optimization program which includes automated exposure control, adjustment of the mA and/or kV according to patient size and/or use of iterative reconstruction technique. CONTRAST:  777mOMNIPAQUE IOHEXOL 350 MG/ML SOLN COMPARISON:  Brain MRI 0313 hours today.  Head CT yesterday. FINDINGS: CTA NECK Skeleton: C5-C6 and C6-C7 ACDF with solid arthrodesis. Mild for age cervical spine degeneration elsewhere. Mandible motion artifact. Absent maxillary dentition. No acute osseous abnormality identified. Upper chest: Centrilobular emphysema with diffuse pulmonary septal thickening. Small or trace layering right pleural effusion. No superior mediastinal lymphadenopathy. Other neck: No acute finding. Aortic arch: Calcified aortic atherosclerosis. 3 vessel arch configuration. Right carotid system: Brachiocephalic artery origin calcified plaque without stenosis. Negative right CCA origin. Faint proximal right CCA calcified plaque without stenosis. Fairly capacious right carotid bifurcation with only mild irregularity of the right ICA bulb. Tortuous vessel with a kinked appearance at the distal bulb, but no stenosis to the skull base. Left carotid system: Similar minimal to mild atherosclerosis for age and no stenosis. Vertebral  arteries: Minimal proximal right subclavian atherosclerosis. Normal right vertebral artery origin. Mildly non dominant right vertebral artery is patent to the skull base with no significant plaque or stenosis. Proximal left subclavian soft and calcified plaque with less  than 50% stenosis. Left vertebral artery origin is normal. Proximal V1 segment is tortuous and partially obscured from ACDF streak artifact. The left vertebral artery is mildly dominant and patent to the skull base with no significant plaque or stenosis. CTA HEAD Posterior circulation: Mild distal vertebral artery and vertebrobasilar junction tortuosity. Fairly codominant V4 segments distal to the left PICA origin. Right PICA diminutive or absent. Patent basilar artery and SCA origins. Fetal type PCA origins, more so the left. Patent basilar tip. Bilateral PCA branches are within normal limits. Anterior circulation: Both ICA siphons are patent. On the left there is mild to moderate cavernous and supraclinoid segment calcified plaque, no significant stenosis. Normal left posterior communicating artery origin. On the right similar mild to moderate cavernous segment plaque without significant stenosis. Normal right posterior communicating artery origin. Patent carotid termini. Normal MCA and ACA origins. Mildly dominant right A1 segment. Normal anterior communicating artery. Bilateral ACA branches are within normal limits. Left MCA M1 segment bifurcates early without stenosis. Right MCA M1 segment and trifurcation are patent without stenosis. Bilateral MCA branches are within normal limits. Venous sinuses: Early contrast timing, grossly patent. Anatomic variants: Fetal PCA origins. Mildly dominant left vertebral artery and right ACA A1. Review of the MIP images confirms the above findings IMPRESSION: 1. Negative for large vessel occlusion. 2. Mild for age atherosclerosis in the head and neck with no significant arterial stenosis. 3. Positive for  pulmonary septal thickening and small layering right pleural effusion superimposed on Emphysema (ICD10-J43.9). Consider Acute Pulmonary Edema. 4. Prior C5 to C7 ACDF with solid arthrodesis. 5.  Aortic Atherosclerosis (ICD10-I70.0). Electronically Signed   By: Genevie Ann M.D.   On: 03/24/2022 04:08   MR BRAIN WO CONTRAST  Result Date: 03/24/2022 CLINICAL DATA:  Acute neurologic deficit EXAM: MRI HEAD WITHOUT CONTRAST TECHNIQUE: Multiplanar, multiecho pulse sequences of the brain and surrounding structures were obtained without intravenous contrast. COMPARISON:  07/15/2017 FINDINGS: Brain: No acute infarct, mass effect or extra-axial collection. No chronic microhemorrhage or siderosis. There is multifocal hyperintense T2-weighted signal within the white matter. Parenchymal volume and CSF spaces are normal. Old small vessel infarct of the right basal ganglia. The midline structures are normal. Vascular: Normal flow voids. Skull and upper cervical spine: Normal marrow signal. Sinuses/Orbits: Negative. Other: None. IMPRESSION: 1. No acute intracranial abnormality. 2. Old small vessel infarct of the right basal ganglia and findings of chronic microvascular ischemia. Electronically Signed   By: Ulyses Jarred M.D.   On: 03/24/2022 03:44   DG Lumbar Spine Complete  Result Date: 03/24/2022 CLINICAL DATA:  Fall EXAM: LUMBAR SPINE - COMPLETE 4+ VIEW COMPARISON:  03/18/2022 FINDINGS: Anterior wedge compression deformity T12 with approximately 50% loss of height is unchanged from prior examination. Normal lumbar lordosis. No acute fracture of the lumbar spine. Vertebral body height is preserved. Diffuse intervertebral disc space narrowing and endplate remodeling is seen throughout the lumbar spine in keeping with changes of diffuse advanced degenerative disc disease. Paraspinal soft tissues are unremarkable. IMPRESSION: 1. No acute fracture or subluxation. 2. Stable T12 compression deformity. 3. Diffuse advanced degenerative  disc disease. Electronically Signed   By: Fidela Salisbury M.D.   On: 03/24/2022 01:07   DG Thoracic Spine 2 View  Result Date: 03/24/2022 CLINICAL DATA:  Fall EXAM: THORACIC SPINE 2 VIEWS COMPARISON:  Lumbar spine radiographs 03/18/2022 Thoracic spine MRI 08/29/2012 FINDINGS: Unchanged appearance of wedge compression fracture at T12 with approximately 50% height loss. This is chronic and was present on MRI of 08/29/2012.  No acute abnormality. IMPRESSION: Chronic T12 compression fracture. No acute findings. Electronically Signed   By: Ulyses Jarred M.D.   On: 03/24/2022 01:04   CT Cervical Spine Wo Contrast  Result Date: 03/24/2022 CLINICAL DATA:  Status post trauma. EXAM: CT CERVICAL SPINE WITHOUT CONTRAST TECHNIQUE: Multidetector CT imaging of the cervical spine was performed without intravenous contrast. Multiplanar CT image reconstructions were also generated. RADIATION DOSE REDUCTION: This exam was performed according to the departmental dose-optimization program which includes automated exposure control, adjustment of the mA and/or kV according to patient size and/or use of iterative reconstruction technique. COMPARISON:  December 25, 2012 FINDINGS: Alignment: There is approximately 1 mm retrolisthesis of the C2 vertebral body on C3. Skull base and vertebrae: No acute fracture. Chronic and degenerative changes are seen along the tip of the dens and adjacent portion of the anterior arch of C1. A metallic density fusion plate and screws are seen along the anterior aspects of the C5, C6 and C7 vertebral bodies. Soft tissues and spinal canal: No prevertebral fluid or swelling. No visible canal hematoma. Disc levels: Mild to moderate severity multilevel endplate sclerosis and mild anterior osteophyte formation is seen throughout the cervical spine. There is moderate to marked severity narrowing of the anterior atlantoaxial articulation. Anterior cervical fusion of the C5-C6 and C6-C7 levels is seen. Mild  intervertebral disc space narrowing is present throughout the remainder of the cervical spine. Bilateral marked severity multilevel facet joint hypertrophy is noted. Upper chest: Marked severity emphysematous lung disease is seen within the bilateral apices. Other: None. IMPRESSION: 1. No acute fracture or subluxation within the cervical spine. 2. Multilevel degenerative changes with anterior cervical fusion of the C5-C6 and C6-C7 levels. 3. Marked severity emphysematous lung disease. Emphysema (ICD10-J43.9). Electronically Signed   By: Virgina Norfolk M.D.   On: 03/24/2022 01:02   CT HEAD WO CONTRAST (5MM)  Result Date: 03/24/2022 CLINICAL DATA:  Neurological deficit. EXAM: CT HEAD WITHOUT CONTRAST TECHNIQUE: Contiguous axial images were obtained from the base of the skull through the vertex without intravenous contrast. RADIATION DOSE REDUCTION: This exam was performed according to the departmental dose-optimization program which includes automated exposure control, adjustment of the mA and/or kV according to patient size and/or use of iterative reconstruction technique. COMPARISON:  July 15, 2017 FINDINGS: Brain: There is mild cerebral atrophy with widening of the extra-axial spaces and ventricular dilatation. There are areas of decreased attenuation within the white matter tracts of the supratentorial brain, consistent with microvascular disease changes. A chronic right lentiform nucleus lacunar infarct is seen. Vascular: There is moderate to marked severity calcification of the bilateral cavernous carotid arteries. Skull: A chronic, mildly displaced left-sided nasal bone fracture is seen. Sinuses/Orbits: Postoperative changes are seen along the medial wall of the right maxillary sinus. Other: None. IMPRESSION: 1. Generalized cerebral atrophy and chronic white matter small vessel ischemic changes without evidence of an acute intracranial abnormality. 2. Chronic right lentiform nucleus lacunar infarct.  Electronically Signed   By: Virgina Norfolk M.D.   On: 03/24/2022 00:57   DG Chest Portable 1 View  Result Date: 03/24/2022 CLINICAL DATA:  Dyspnea, generalized weakness EXAM: PORTABLE CHEST 1 VIEW COMPARISON:  08/17/2019 FINDINGS: The heart size and mediastinal contours are within normal limits. Both lungs are clear. The visualized skeletal structures are unremarkable. IMPRESSION: No active disease. Electronically Signed   By: Fidela Salisbury M.D.   On: 03/24/2022 00:24   DG Lumbar Spine Complete  Result Date: 03/20/2022 CLINICAL DATA:  Post fall 1  month ago with persistent right-sided back pain. EXAM: LUMBAR SPINE - COMPLETE 4+ VIEW COMPARISON:  02/16/2017 FINDINGS: There are 5 non rib-bearing lumbar type vertebral bodies Mild scoliotic curvature of the thoracolumbar spine with dominant mid component convex to the right measuring approximately 8 degrees (as measured from the inferior endplate of 624THL to the inferior endplate of L3). There is minimal (2-3 mm) of anterolisthesis of L4 upon L5. No retrolisthesis. Moderate (50%) compression deformity involving the superior endplate of the 624THL vertebral body is unchanged compared to remote lumbar spine radiographs performed in 2019. Remaining lumbar vertebral body heights appear preserved. Moderate multilevel lumbar spine DDD, worse at L3-L4 and L5-S1 with disc space height loss, endplate irregularity and sclerosis. Limited visualization of the bilateral SI joints is normal. Note is made of an ingested radiopaque pill fragment. Atherosclerotic plaque within the abdominal aorta. IMPRESSION: 1. Moderate multilevel lumbar spine DDD, worse at L3-L4 and L5-S1, progressed compared to the 2019 examination. 2. Moderate (approximately 50%) compression deformity involving the superior endplate of 624THL is unchanged compared to remote lumbar spine radiographs performed in 2019. 3.  Aortic Atherosclerosis (ICD10-I70.0). Electronically Signed   By: Sandi Mariscal M.D.   On:  03/20/2022 08:33   DG Sacrum/Coccyx  Result Date: 03/20/2022 CLINICAL DATA:  Post fall 1 month ago with persistent right-sided sciatica symptoms. EXAM: SACRUM AND COCCYX - 2+ VIEW COMPARISON:  Lumbar spine radiographs-earlier same day Sacral coccygeal radiographs-07/30/2019 FINDINGS: No displaced sacral or coccygeal fracture. Normal appearance of the bilateral SI joints and pubic symphysis. Degenerative change of the lower lumbar spine is suspected though incompletely evaluated. Phleboliths overlie the lower pelvis bilaterally. IMPRESSION: No displaced sacral or coccygeal fracture. No evidence of sacroiliitis. Electronically Signed   By: Sandi Mariscal M.D.   On: 03/20/2022 08:30    Microbiology: Results for orders placed or performed during the hospital encounter of 03/23/22  Resp panel by RT-PCR (RSV, Flu A&B, Covid) Anterior Nasal Swab     Status: None   Collection Time: 03/23/22 11:46 PM   Specimen: Anterior Nasal Swab  Result Value Ref Range Status   SARS Coronavirus 2 by RT PCR NEGATIVE NEGATIVE Final    Comment: (NOTE) SARS-CoV-2 target nucleic acids are NOT DETECTED.  The SARS-CoV-2 RNA is generally detectable in upper respiratory specimens during the acute phase of infection. The lowest concentration of SARS-CoV-2 viral copies this assay can detect is 138 copies/mL. A negative result does not preclude SARS-Cov-2 infection and should not be used as the sole basis for treatment or other patient management decisions. A negative result may occur with  improper specimen collection/handling, submission of specimen other than nasopharyngeal swab, presence of viral mutation(s) within the areas targeted by this assay, and inadequate number of viral copies(<138 copies/mL). A negative result must be combined with clinical observations, patient history, and epidemiological information. The expected result is Negative.  Fact Sheet for Patients:   EntrepreneurPulse.com.au  Fact Sheet for Healthcare Providers:  IncredibleEmployment.be  This test is no t yet approved or cleared by the [place] FDA and  has been authorized for detection and/or diagnosis of SARS-CoV-2 by FDA under an Emergency Use Authorization (EUA). This EUA will remain  in effect (meaning this test can be used) for the duration of the COVID-19 declaration under Section 564(b)(1) of the Act, 21 U.S.C.section 360bbb-3(b)(1), unless the authorization is terminated  or revoked sooner.       Influenza A by PCR NEGATIVE NEGATIVE Final   Influenza B by PCR NEGATIVE NEGATIVE  Final    Comment: (NOTE) The Xpert Xpress SARS-CoV-2/FLU/RSV plus assay is intended as an aid in the diagnosis of influenza from Nasopharyngeal swab specimens and should not be used as a sole basis for treatment. Nasal washings and aspirates are unacceptable for Xpert Xpress SARS-CoV-2/FLU/RSV testing.  Fact Sheet for Patients: EntrepreneurPulse.com.au  Fact Sheet for Healthcare Providers: IncredibleEmployment.be  This test is not yet approved or cleared by the [place] FDA and has been authorized for detection and/or diagnosis of SARS-CoV-2 by FDA under an Emergency Use Authorization (EUA). This EUA will remain in effect (meaning this test can be used) for the duration of the COVID-19 declaration under Section 564(b)(1) of the Act, 21 U.S.C. section 360bbb-3(b)(1), unless the authorization is terminated or revoked.     Resp Syncytial Virus by PCR NEGATIVE NEGATIVE Final    Comment: (NOTE) Fact Sheet for Patients: EntrepreneurPulse.com.au  Fact Sheet for Healthcare Providers: IncredibleEmployment.be  This test is not yet approved or cleared by the [place] FDA and has been authorized for detection and/or diagnosis of SARS-CoV-2 by FDA under an Emergency Use  Authorization (EUA). This EUA will remain in effect (meaning this test can be used) for the duration of the COVID-19 declaration under Section 564(b)(1) of the Act, 21 U.S.C. section 360bbb-3(b)(1), unless the authorization is terminated or revoked.  Performed at [facility], [address] [place], [place] [address]     Labs: CBC: Recent Labs  Lab 03/23/22 2346 03/24/22 0510  WBC 12.9* 12.2*  NEUTROABS 9.6*  --   HGB 11.2* 10.2*  HCT 35.0* 32.4*  MCV 95.1 95.9  PLT 219 [id number]   Basic Metabolic Panel: Recent Labs  Lab 03/23/22 2346 03/24/22 0510  NA 140 139  K 4.2 3.9  CL 107 104  CO2 26 28  GLUCOSE 123* 102*  BUN 27* 23  CREATININE 1.25* 1.11*  CALCIUM 8.9 8.6*   Liver Function Tests: Recent Labs  Lab 03/23/22 2346  AST 39  ALT 39  ALKPHOS 103  BILITOT 0.8  PROT 6.2*  ALBUMIN 3.1*   CBG: No results for input(s): "GLUCAP" in the last 168 hours.  Discharge time spent: greater than 30 minutes.  Signed: Sharen Hones, MD [facility] 03/25/2022

## 2022-03-25 NOTE — Progress Notes (Signed)
   03/25/22 0900  Spiritual Encounters  Type of Visit Initial  Care provided to: Patient  Conversation partners present during encounter Nurse  Referral source Nurse (RN/NT/LPN)  Reason for visit Advance directives  OnCall Visit Yes   Chaplain responded to nurse consult. Chaplain provided compassionate presence and reflective listening as patient spoke about health challenges. Chaplain also provided education regarding advance directives and Chaplain services. Patient appreciated [place] visit.

## 2022-03-25 NOTE — Telephone Encounter (Signed)
Beth [name] with [facility] is calling to see if PCP would follow through with PT/OT/Home Health Aide/Social Worker for pt. Pt is being discharged from the hospital today.   Please advise

## 2022-03-26 ENCOUNTER — Ambulatory Visit: Payer: 59 | Attending: Pulmonary Disease

## 2022-03-28 DIAGNOSIS — E7800 Pure hypercholesterolemia, unspecified: Secondary | ICD-10-CM | POA: Diagnosis not present

## 2022-03-28 DIAGNOSIS — Z8673 Personal history of transient ischemic attack (TIA), and cerebral infarction without residual deficits: Secondary | ICD-10-CM | POA: Diagnosis not present

## 2022-03-28 DIAGNOSIS — I700 Atherosclerosis of aorta: Secondary | ICD-10-CM | POA: Diagnosis not present

## 2022-03-28 DIAGNOSIS — E039 Hypothyroidism, unspecified: Secondary | ICD-10-CM | POA: Diagnosis not present

## 2022-03-28 DIAGNOSIS — N1831 Chronic kidney disease, stage 3a: Secondary | ICD-10-CM | POA: Diagnosis not present

## 2022-03-28 DIAGNOSIS — I083 Combined rheumatic disorders of mitral, aortic and tricuspid valves: Secondary | ICD-10-CM | POA: Diagnosis not present

## 2022-03-28 DIAGNOSIS — J441 Chronic obstructive pulmonary disease with (acute) exacerbation: Secondary | ICD-10-CM | POA: Diagnosis not present

## 2022-03-28 DIAGNOSIS — M5136 Other intervertebral disc degeneration, lumbar region: Secondary | ICD-10-CM | POA: Diagnosis not present

## 2022-03-28 DIAGNOSIS — D509 Iron deficiency anemia, unspecified: Secondary | ICD-10-CM | POA: Diagnosis not present

## 2022-03-28 DIAGNOSIS — J4489 Other specified chronic obstructive pulmonary disease: Secondary | ICD-10-CM | POA: Diagnosis not present

## 2022-03-28 DIAGNOSIS — Z87891 Personal history of nicotine dependence: Secondary | ICD-10-CM | POA: Diagnosis not present

## 2022-03-28 DIAGNOSIS — G8929 Other chronic pain: Secondary | ICD-10-CM | POA: Diagnosis not present

## 2022-03-28 DIAGNOSIS — K219 Gastro-esophageal reflux disease without esophagitis: Secondary | ICD-10-CM | POA: Diagnosis not present

## 2022-03-28 DIAGNOSIS — J439 Emphysema, unspecified: Secondary | ICD-10-CM | POA: Diagnosis not present

## 2022-03-28 DIAGNOSIS — I509 Heart failure, unspecified: Secondary | ICD-10-CM | POA: Diagnosis not present

## 2022-03-28 DIAGNOSIS — Z7982 Long term (current) use of aspirin: Secondary | ICD-10-CM | POA: Diagnosis not present

## 2022-03-28 DIAGNOSIS — K589 Irritable bowel syndrome without diarrhea: Secondary | ICD-10-CM | POA: Diagnosis not present

## 2022-03-28 DIAGNOSIS — M1990 Unspecified osteoarthritis, unspecified site: Secondary | ICD-10-CM | POA: Diagnosis not present

## 2022-03-28 DIAGNOSIS — I4891 Unspecified atrial fibrillation: Secondary | ICD-10-CM | POA: Diagnosis not present

## 2022-03-28 DIAGNOSIS — I2721 Secondary pulmonary arterial hypertension: Secondary | ICD-10-CM | POA: Diagnosis not present

## 2022-03-28 DIAGNOSIS — Z9981 Dependence on supplemental oxygen: Secondary | ICD-10-CM | POA: Diagnosis not present

## 2022-03-28 DIAGNOSIS — I130 Hypertensive heart and chronic kidney disease with heart failure and stage 1 through stage 4 chronic kidney disease, or unspecified chronic kidney disease: Secondary | ICD-10-CM | POA: Diagnosis not present

## 2022-03-28 DIAGNOSIS — M797 Fibromyalgia: Secondary | ICD-10-CM | POA: Diagnosis not present

## 2022-03-28 DIAGNOSIS — M47812 Spondylosis without myelopathy or radiculopathy, cervical region: Secondary | ICD-10-CM | POA: Diagnosis not present

## 2022-03-29 ENCOUNTER — Telehealth: Payer: Medicare Other

## 2022-03-29 ENCOUNTER — Telehealth: Payer: Self-pay

## 2022-03-29 ENCOUNTER — Telehealth: Payer: Self-pay | Admitting: Family Medicine

## 2022-03-29 DIAGNOSIS — I700 Atherosclerosis of aorta: Secondary | ICD-10-CM | POA: Diagnosis not present

## 2022-03-29 DIAGNOSIS — I130 Hypertensive heart and chronic kidney disease with heart failure and stage 1 through stage 4 chronic kidney disease, or unspecified chronic kidney disease: Secondary | ICD-10-CM | POA: Diagnosis not present

## 2022-03-29 DIAGNOSIS — J439 Emphysema, unspecified: Secondary | ICD-10-CM | POA: Diagnosis not present

## 2022-03-29 DIAGNOSIS — I509 Heart failure, unspecified: Secondary | ICD-10-CM | POA: Diagnosis not present

## 2022-03-29 DIAGNOSIS — Z9981 Dependence on supplemental oxygen: Secondary | ICD-10-CM | POA: Diagnosis not present

## 2022-03-29 DIAGNOSIS — I083 Combined rheumatic disorders of mitral, aortic and tricuspid valves: Secondary | ICD-10-CM | POA: Diagnosis not present

## 2022-03-29 DIAGNOSIS — N1831 Chronic kidney disease, stage 3a: Secondary | ICD-10-CM | POA: Diagnosis not present

## 2022-03-29 DIAGNOSIS — I2721 Secondary pulmonary arterial hypertension: Secondary | ICD-10-CM | POA: Diagnosis not present

## 2022-03-29 DIAGNOSIS — G8929 Other chronic pain: Secondary | ICD-10-CM | POA: Diagnosis not present

## 2022-03-29 DIAGNOSIS — Z7982 Long term (current) use of aspirin: Secondary | ICD-10-CM | POA: Diagnosis not present

## 2022-03-29 DIAGNOSIS — M1990 Unspecified osteoarthritis, unspecified site: Secondary | ICD-10-CM | POA: Diagnosis not present

## 2022-03-29 DIAGNOSIS — E7800 Pure hypercholesterolemia, unspecified: Secondary | ICD-10-CM | POA: Diagnosis not present

## 2022-03-29 DIAGNOSIS — M797 Fibromyalgia: Secondary | ICD-10-CM | POA: Diagnosis not present

## 2022-03-29 DIAGNOSIS — I4891 Unspecified atrial fibrillation: Secondary | ICD-10-CM | POA: Diagnosis not present

## 2022-03-29 DIAGNOSIS — Z87891 Personal history of nicotine dependence: Secondary | ICD-10-CM | POA: Diagnosis not present

## 2022-03-29 DIAGNOSIS — K589 Irritable bowel syndrome without diarrhea: Secondary | ICD-10-CM | POA: Diagnosis not present

## 2022-03-29 DIAGNOSIS — D509 Iron deficiency anemia, unspecified: Secondary | ICD-10-CM | POA: Diagnosis not present

## 2022-03-29 DIAGNOSIS — M5136 Other intervertebral disc degeneration, lumbar region: Secondary | ICD-10-CM | POA: Diagnosis not present

## 2022-03-29 DIAGNOSIS — E039 Hypothyroidism, unspecified: Secondary | ICD-10-CM | POA: Diagnosis not present

## 2022-03-29 DIAGNOSIS — M47812 Spondylosis without myelopathy or radiculopathy, cervical region: Secondary | ICD-10-CM | POA: Diagnosis not present

## 2022-03-29 DIAGNOSIS — J4489 Other specified chronic obstructive pulmonary disease: Secondary | ICD-10-CM | POA: Diagnosis not present

## 2022-03-29 DIAGNOSIS — Z8673 Personal history of transient ischemic attack (TIA), and cerebral infarction without residual deficits: Secondary | ICD-10-CM | POA: Diagnosis not present

## 2022-03-29 DIAGNOSIS — K219 Gastro-esophageal reflux disease without esophagitis: Secondary | ICD-10-CM | POA: Diagnosis not present

## 2022-03-29 NOTE — Telephone Encounter (Unsigned)
Copied from [place] [id number]. Topic: Quick Communication - Home Health Verbal Orders >> Mar 29, 2022  4:21 PM Cyndi Bender wrote: Caller/Agency: Colletta [place] with [facility] Callback Number: [phone] Requesting OT/PT/Skilled Nursing/Social Work/Speech Therapy: OT Frequency: 1 time a week for 6 weeks

## 2022-03-29 NOTE — Telephone Encounter (Signed)
   CCM RN Visit Note   03-29-2022 Name: Beth [name] MRN: [id number]      DOB: 02-Jun-1945  Subjective: Beth [name] is a 77 y.o. year old female who is a primary care patient of Dr. Parks Ranger. The patient was referred to the Chronic Care Management team for assistance with care management needs subsequent to provider initiation of CCM services and plan of care.      An unsuccessful telephone outreach was attempted today to contact the patient about Chronic Care Management needs.    Plan:A HIPAA compliant phone message was left for the patient providing contact information and requesting a return call.  [name] RN, MSN, CCM RN Care Manager  Chronic Care Management Direct Number: [phone]

## 2022-04-01 NOTE — Telephone Encounter (Signed)
Okay to proceed with orders  Nobie Putnam, [facility] 04/01/2022, 12:23 PM

## 2022-04-01 NOTE — Telephone Encounter (Signed)
I called and left a detail message on [place] OT, voicemail for a verbal order to proceed with Occupational Therapy x 1 week for 6 weeks.

## 2022-04-02 DIAGNOSIS — I700 Atherosclerosis of aorta: Secondary | ICD-10-CM | POA: Diagnosis not present

## 2022-04-02 DIAGNOSIS — J439 Emphysema, unspecified: Secondary | ICD-10-CM | POA: Diagnosis not present

## 2022-04-02 DIAGNOSIS — M1990 Unspecified osteoarthritis, unspecified site: Secondary | ICD-10-CM | POA: Diagnosis not present

## 2022-04-02 DIAGNOSIS — I2721 Secondary pulmonary arterial hypertension: Secondary | ICD-10-CM | POA: Diagnosis not present

## 2022-04-02 DIAGNOSIS — J4489 Other specified chronic obstructive pulmonary disease: Secondary | ICD-10-CM | POA: Diagnosis not present

## 2022-04-02 DIAGNOSIS — Z9981 Dependence on supplemental oxygen: Secondary | ICD-10-CM | POA: Diagnosis not present

## 2022-04-02 DIAGNOSIS — Z7982 Long term (current) use of aspirin: Secondary | ICD-10-CM | POA: Diagnosis not present

## 2022-04-02 DIAGNOSIS — E7800 Pure hypercholesterolemia, unspecified: Secondary | ICD-10-CM | POA: Diagnosis not present

## 2022-04-02 DIAGNOSIS — I4891 Unspecified atrial fibrillation: Secondary | ICD-10-CM | POA: Diagnosis not present

## 2022-04-02 DIAGNOSIS — K219 Gastro-esophageal reflux disease without esophagitis: Secondary | ICD-10-CM | POA: Diagnosis not present

## 2022-04-02 DIAGNOSIS — Z87891 Personal history of nicotine dependence: Secondary | ICD-10-CM | POA: Diagnosis not present

## 2022-04-02 DIAGNOSIS — K589 Irritable bowel syndrome without diarrhea: Secondary | ICD-10-CM | POA: Diagnosis not present

## 2022-04-02 DIAGNOSIS — D509 Iron deficiency anemia, unspecified: Secondary | ICD-10-CM | POA: Diagnosis not present

## 2022-04-02 DIAGNOSIS — E039 Hypothyroidism, unspecified: Secondary | ICD-10-CM | POA: Diagnosis not present

## 2022-04-02 DIAGNOSIS — Z8673 Personal history of transient ischemic attack (TIA), and cerebral infarction without residual deficits: Secondary | ICD-10-CM | POA: Diagnosis not present

## 2022-04-02 DIAGNOSIS — G8929 Other chronic pain: Secondary | ICD-10-CM | POA: Diagnosis not present

## 2022-04-02 DIAGNOSIS — M797 Fibromyalgia: Secondary | ICD-10-CM | POA: Diagnosis not present

## 2022-04-02 DIAGNOSIS — N1831 Chronic kidney disease, stage 3a: Secondary | ICD-10-CM | POA: Diagnosis not present

## 2022-04-02 DIAGNOSIS — I509 Heart failure, unspecified: Secondary | ICD-10-CM | POA: Diagnosis not present

## 2022-04-02 DIAGNOSIS — M47812 Spondylosis without myelopathy or radiculopathy, cervical region: Secondary | ICD-10-CM | POA: Diagnosis not present

## 2022-04-02 DIAGNOSIS — I083 Combined rheumatic disorders of mitral, aortic and tricuspid valves: Secondary | ICD-10-CM | POA: Diagnosis not present

## 2022-04-02 DIAGNOSIS — I130 Hypertensive heart and chronic kidney disease with heart failure and stage 1 through stage 4 chronic kidney disease, or unspecified chronic kidney disease: Secondary | ICD-10-CM | POA: Diagnosis not present

## 2022-04-02 DIAGNOSIS — M5136 Other intervertebral disc degeneration, lumbar region: Secondary | ICD-10-CM | POA: Diagnosis not present

## 2022-04-03 ENCOUNTER — Telehealth: Payer: 59

## 2022-04-03 DIAGNOSIS — E039 Hypothyroidism, unspecified: Secondary | ICD-10-CM | POA: Diagnosis not present

## 2022-04-03 DIAGNOSIS — E7800 Pure hypercholesterolemia, unspecified: Secondary | ICD-10-CM | POA: Diagnosis not present

## 2022-04-03 DIAGNOSIS — M1990 Unspecified osteoarthritis, unspecified site: Secondary | ICD-10-CM | POA: Diagnosis not present

## 2022-04-03 DIAGNOSIS — Z8673 Personal history of transient ischemic attack (TIA), and cerebral infarction without residual deficits: Secondary | ICD-10-CM | POA: Diagnosis not present

## 2022-04-03 DIAGNOSIS — K219 Gastro-esophageal reflux disease without esophagitis: Secondary | ICD-10-CM | POA: Diagnosis not present

## 2022-04-03 DIAGNOSIS — I130 Hypertensive heart and chronic kidney disease with heart failure and stage 1 through stage 4 chronic kidney disease, or unspecified chronic kidney disease: Secondary | ICD-10-CM | POA: Diagnosis not present

## 2022-04-03 DIAGNOSIS — I700 Atherosclerosis of aorta: Secondary | ICD-10-CM | POA: Diagnosis not present

## 2022-04-03 DIAGNOSIS — D509 Iron deficiency anemia, unspecified: Secondary | ICD-10-CM | POA: Diagnosis not present

## 2022-04-03 DIAGNOSIS — I2721 Secondary pulmonary arterial hypertension: Secondary | ICD-10-CM | POA: Diagnosis not present

## 2022-04-03 DIAGNOSIS — I083 Combined rheumatic disorders of mitral, aortic and tricuspid valves: Secondary | ICD-10-CM | POA: Diagnosis not present

## 2022-04-03 DIAGNOSIS — M5136 Other intervertebral disc degeneration, lumbar region: Secondary | ICD-10-CM | POA: Diagnosis not present

## 2022-04-03 DIAGNOSIS — I4891 Unspecified atrial fibrillation: Secondary | ICD-10-CM | POA: Diagnosis not present

## 2022-04-03 DIAGNOSIS — Z87891 Personal history of nicotine dependence: Secondary | ICD-10-CM | POA: Diagnosis not present

## 2022-04-03 DIAGNOSIS — G8929 Other chronic pain: Secondary | ICD-10-CM | POA: Diagnosis not present

## 2022-04-03 DIAGNOSIS — M47812 Spondylosis without myelopathy or radiculopathy, cervical region: Secondary | ICD-10-CM | POA: Diagnosis not present

## 2022-04-03 DIAGNOSIS — M797 Fibromyalgia: Secondary | ICD-10-CM | POA: Diagnosis not present

## 2022-04-03 DIAGNOSIS — I509 Heart failure, unspecified: Secondary | ICD-10-CM | POA: Diagnosis not present

## 2022-04-03 DIAGNOSIS — Z9981 Dependence on supplemental oxygen: Secondary | ICD-10-CM | POA: Diagnosis not present

## 2022-04-03 DIAGNOSIS — K589 Irritable bowel syndrome without diarrhea: Secondary | ICD-10-CM | POA: Diagnosis not present

## 2022-04-03 DIAGNOSIS — J439 Emphysema, unspecified: Secondary | ICD-10-CM | POA: Diagnosis not present

## 2022-04-03 DIAGNOSIS — J4489 Other specified chronic obstructive pulmonary disease: Secondary | ICD-10-CM | POA: Diagnosis not present

## 2022-04-03 DIAGNOSIS — N1831 Chronic kidney disease, stage 3a: Secondary | ICD-10-CM | POA: Diagnosis not present

## 2022-04-03 DIAGNOSIS — Z7982 Long term (current) use of aspirin: Secondary | ICD-10-CM | POA: Diagnosis not present

## 2022-04-04 ENCOUNTER — Ambulatory Visit: Payer: Self-pay

## 2022-04-04 DIAGNOSIS — J439 Emphysema, unspecified: Secondary | ICD-10-CM | POA: Diagnosis not present

## 2022-04-04 DIAGNOSIS — I2721 Secondary pulmonary arterial hypertension: Secondary | ICD-10-CM | POA: Diagnosis not present

## 2022-04-04 DIAGNOSIS — Z9981 Dependence on supplemental oxygen: Secondary | ICD-10-CM | POA: Diagnosis not present

## 2022-04-04 DIAGNOSIS — I130 Hypertensive heart and chronic kidney disease with heart failure and stage 1 through stage 4 chronic kidney disease, or unspecified chronic kidney disease: Secondary | ICD-10-CM | POA: Diagnosis not present

## 2022-04-04 DIAGNOSIS — E039 Hypothyroidism, unspecified: Secondary | ICD-10-CM | POA: Diagnosis not present

## 2022-04-04 DIAGNOSIS — I083 Combined rheumatic disorders of mitral, aortic and tricuspid valves: Secondary | ICD-10-CM | POA: Diagnosis not present

## 2022-04-04 DIAGNOSIS — M797 Fibromyalgia: Secondary | ICD-10-CM | POA: Diagnosis not present

## 2022-04-04 DIAGNOSIS — K219 Gastro-esophageal reflux disease without esophagitis: Secondary | ICD-10-CM | POA: Diagnosis not present

## 2022-04-04 DIAGNOSIS — I509 Heart failure, unspecified: Secondary | ICD-10-CM | POA: Diagnosis not present

## 2022-04-04 DIAGNOSIS — M47812 Spondylosis without myelopathy or radiculopathy, cervical region: Secondary | ICD-10-CM | POA: Diagnosis not present

## 2022-04-04 DIAGNOSIS — Z8673 Personal history of transient ischemic attack (TIA), and cerebral infarction without residual deficits: Secondary | ICD-10-CM | POA: Diagnosis not present

## 2022-04-04 DIAGNOSIS — M1990 Unspecified osteoarthritis, unspecified site: Secondary | ICD-10-CM | POA: Diagnosis not present

## 2022-04-04 DIAGNOSIS — I700 Atherosclerosis of aorta: Secondary | ICD-10-CM | POA: Diagnosis not present

## 2022-04-04 DIAGNOSIS — N1831 Chronic kidney disease, stage 3a: Secondary | ICD-10-CM | POA: Diagnosis not present

## 2022-04-04 DIAGNOSIS — Z7982 Long term (current) use of aspirin: Secondary | ICD-10-CM | POA: Diagnosis not present

## 2022-04-04 DIAGNOSIS — Z87891 Personal history of nicotine dependence: Secondary | ICD-10-CM | POA: Diagnosis not present

## 2022-04-04 DIAGNOSIS — G8929 Other chronic pain: Secondary | ICD-10-CM | POA: Diagnosis not present

## 2022-04-04 DIAGNOSIS — E7800 Pure hypercholesterolemia, unspecified: Secondary | ICD-10-CM | POA: Diagnosis not present

## 2022-04-04 DIAGNOSIS — J4489 Other specified chronic obstructive pulmonary disease: Secondary | ICD-10-CM | POA: Diagnosis not present

## 2022-04-04 DIAGNOSIS — K589 Irritable bowel syndrome without diarrhea: Secondary | ICD-10-CM | POA: Diagnosis not present

## 2022-04-04 DIAGNOSIS — D509 Iron deficiency anemia, unspecified: Secondary | ICD-10-CM | POA: Diagnosis not present

## 2022-04-04 DIAGNOSIS — M5136 Other intervertebral disc degeneration, lumbar region: Secondary | ICD-10-CM | POA: Diagnosis not present

## 2022-04-04 DIAGNOSIS — I4891 Unspecified atrial fibrillation: Secondary | ICD-10-CM | POA: Diagnosis not present

## 2022-04-04 NOTE — Telephone Encounter (Signed)
  Chief Complaint: Burning with urination, and right side pain 7/10 Symptoms: above Frequency: 4-5 days Pertinent Negatives: Patient denies Fever, inability to urinate, flank pain Disposition: '[]'$ ED /'[]'$ Urgent Care (no appt availability in office) / '[x]'$ Appointment(In office/virtual)/ '[]'$  [place] [facility]/ '[]'$ Home Care/ '[]'$ Refused Recommended Disposition /'[]'$ [place] [facility]/ '[]'$  Follow-up with PCP Additional Notes: Received call from [place] a home health nurse visiting pt. She states that pt has burning with urination and frequency. Temp 98.3, R 20, P 63, O2 92 % on room air, Bp 140/80. Pt also reports pain on right lower abdomen of 7/10. Pt is unsure if pain started before or after UTI s/s. Pt unable to come to office today, and has taken an appt for tomorrow morning. Pt would alos like to discuss pain medications for right lower abdominal pain.  PT would like medication call in today for pain.  [place] would like home health to visit 1x per week for 6 weeks.   Please advise.    Reason for Disposition  Age > 50 years  Answer Assessment - Initial Assessment Questions 1. SEVERITY: "How bad is the pain?"  (e.g., Scale 1-10; mild, moderate, or severe)   - MILD (1-3): complains slightly about urination hurting   - MODERATE (4-7): interferes with normal activities     - SEVERE (8-10): excruciating, unwilling or unable to urinate because of the pain      7/10 2. FREQUENCY: "How many times have you had painful urination today?"      3 3. PATTERN: "Is pain present every time you urinate or just sometimes?"      Every time 4. ONSET: "When did the painful urination start?"      4-5 days 5. FEVER: "Do you have a fever?" If Yes, ask: "What is your temperature, how was it measured, and when did it start?"     no 6. PAST UTI: "Have you had a urine infection before?" If Yes, ask: "When was the last time?" and "What happened that time?"      yes 7. CAUSE: "What do you think is causing the  painful urination?"  (e.g., UTI, scratch, Herpes sore)     UTI 8. OTHER SYMPTOMS: "Do you have any other symptoms?" (e.g., blood in urine, flank pain, genital sores, urgency, vaginal discharge)     Front rt side  Protocols used: Urination Pain - Female-A-AH

## 2022-04-04 NOTE — Telephone Encounter (Signed)
I have already spoken with the patient and she is coming in tomorrow at 53.

## 2022-04-05 ENCOUNTER — Ambulatory Visit (INDEPENDENT_AMBULATORY_CARE_PROVIDER_SITE_OTHER): Payer: 59 | Admitting: Family Medicine

## 2022-04-05 ENCOUNTER — Ambulatory Visit (INDEPENDENT_AMBULATORY_CARE_PROVIDER_SITE_OTHER): Payer: 59

## 2022-04-05 ENCOUNTER — Encounter: Payer: Self-pay | Admitting: Family Medicine

## 2022-04-05 ENCOUNTER — Telehealth: Payer: 59

## 2022-04-05 VITALS — BP 122/68 | HR 70 | Ht 63.0 in | Wt 147.8 lb

## 2022-04-05 DIAGNOSIS — N12 Tubulo-interstitial nephritis, not specified as acute or chronic: Secondary | ICD-10-CM

## 2022-04-05 DIAGNOSIS — B379 Candidiasis, unspecified: Secondary | ICD-10-CM

## 2022-04-05 DIAGNOSIS — R110 Nausea: Secondary | ICD-10-CM

## 2022-04-05 DIAGNOSIS — F332 Major depressive disorder, recurrent severe without psychotic features: Secondary | ICD-10-CM

## 2022-04-05 DIAGNOSIS — F411 Generalized anxiety disorder: Secondary | ICD-10-CM

## 2022-04-05 DIAGNOSIS — R296 Repeated falls: Secondary | ICD-10-CM

## 2022-04-05 DIAGNOSIS — I5032 Chronic diastolic (congestive) heart failure: Secondary | ICD-10-CM

## 2022-04-05 LAB — POCT URINALYSIS DIPSTICK
Bilirubin, UA: NEGATIVE
Glucose, UA: NEGATIVE
Ketones, UA: NEGATIVE
Nitrite, UA: NEGATIVE
Protein, UA: NEGATIVE
Spec Grav, UA: 1.015 (ref 1.010–1.025)
Urobilinogen, UA: 0.2 E.U./dL
pH, UA: 5 (ref 5.0–8.0)

## 2022-04-05 MED ORDER — HYDROCODONE-ACETAMINOPHEN 5-325 MG PO TABS: 1.0000 | ORAL_TABLET | Freq: Four times a day (QID) | ORAL | 0 refills | Status: DC | PRN

## 2022-04-05 MED ORDER — FLUCONAZOLE 150 MG PO TABS: ORAL_TABLET | ORAL | 0 refills | Status: DC

## 2022-04-05 MED ORDER — CIPROFLOXACIN HCL 500 MG PO TABS: 500.0000 mg | ORAL_TABLET | Freq: Two times a day (BID) | ORAL | 0 refills | Status: AC

## 2022-04-05 MED ORDER — ONDANSETRON 4 MG PO TBDP: 4.0000 mg | ORAL_TABLET | Freq: Three times a day (TID) | ORAL | 0 refills | Status: DC | PRN

## 2022-04-05 NOTE — Chronic Care Management (AMB) (Signed)
Chronic Care Management   CCM RN Visit Note  04/05/2022 Name: Beth [name] MRN: [id number] DOB: [birth year]  Subjective: Beth [name] is a 77 y.o. year old female who is a primary care patient of [name], DO. The patient was referred to the Chronic Care Management team for assistance with care management needs subsequent to provider initiation of CCM services and plan of care.    Today's Visit:  Engaged with patient by telephone for follow up visit.        Goals Addressed             This Visit's Progress    CCM Expected Outcome:  Monitor, Self-Manage and Reduce Symptoms of  Depression and anxiety       Current Barriers:  Care Coordination needs related to ongoing support and education needs for mental health  in a patient with depression and anxiety Chronic Disease Management support and education needs related to effective management of depression  Lacks caregiver support.   Planned Interventions: Evaluation of current treatment plan related to depression and anxiety and patient's adherence to plan as established by provider. The patient last spoke to her counselor on 03-07-2022. The patient was in the hospital from 03-23-2022 to 03-25-2022 to rule out a stroke.  She had left sided weakness and 3 falls at home. The patient is happy to be out of the hospital. Saw pcp today. The patient currently has a UTI. She has not received notification that her medications are ready for pick up yet. Encouraged her to call this afternoon if she had not heard from them soon. Review of taking all of her medications as prescribed and monitoring for changes in her sx and sx to call the provider. She says overall she is stable. Continues to have a flat affect when speaking with the [facility] but pleasant and answers questions accordingly. Appreciative of the call and the help from the team. Reflective listening and support given.  Advised patient to call the office for changes in mental  health, mood, anxiety, and depression. The patient is thankful for the support of the counselor and feels she can be open and honest about her mental health and well being. Encouraged her to call her counselor or the [facility] for changes or new needs related to health and well being.   Provided education to patient re: keeping provider appointments, working with specialist for mental  health needs, and working with the CCM team to effectively manage mental health and well being Reviewed medications with patient and discussed compliance. The patient has her medications and no issues with getting medications. States compliance with medications. Review of medications from pcp visit today. The patient states she will take as directed.  Collaborated with pcp, and LCSW regarding effective management of depression and mental health needs  Discussed plans with patient for ongoing care management follow up and provided patient with direct contact information for care management team Advised patient to discuss changes in her mental health and well being  with provider Screening for signs and symptoms of depression related to chronic disease state  Assessed social determinant of health barriers The patient is working with home health in her home. She is hopeful this will help her regain her strength. She feels safe and states she is staying by herself. Review of needs and the patient declines any needs at this time. Encouraged the patient to call for changes or concerns. Review of fall prevention and safety. The patient is thankful  to be home from the hospital and just wants to feel better from the UTI.  Education on what worse looks like and to call for changes or seek emergent help.   Symptom Management: Attend all scheduled provider appointments Perform IADL's (shopping, preparing meals, housekeeping, managing finances) independently Call provider office for new concerns or questions  call the Suicide and Crisis  Lifeline: 988 call the [place] National Suicide Prevention Lifeline: 409-318-2281 or TTY: 910-297-7430 TTY 430-766-9287) to talk to a trained counselor call 1-800-273-TALK (toll free, 24 hour hotline) if experiencing a Mental Health or [place]   Follow Up Plan: Telephone follow up appointment with care management team member scheduled for: 05-17-2022 at 230 pm       CCM Expected Outcome:  Monitor, Self-Manage and Reduce Symptoms of Heart Failure       Current Barriers:  Knowledge Deficits related to sx and sx management of HF Care Coordination needs related to ongoing support and education needs  in a patient with HF Chronic Disease Management support and education needs related to effective management of HF Lacks caregiver support.  Transportation barriers  Planned Interventions: Basic overview and discussion of pathophysiology of Heart Failure reviewed. The patient denies any new issues with her heart failure. She was recently in the hospital for left sided weakness and fall x 3 at home. Hospital stay 03-23-2022 to 03-25-2022. The patient saw the pcp today for a UTI. The patient states she has had pain on urination and just over all not feeling well. Home Health is coming into the home and she says she feels like this will help her. It was recommended she go to SNF but the patient declined. Education and support given.  Provided education on low sodium diet. Review of monitoring dietary restrictions and making sure she stays adequately hydrated. The patient states she is eating and drinking and monitoring for changes. Reviewed Heart Failure Action Plan in depth Assessed need for readable accurate scales in home. The patient has a scales. Knows how to manage heart failure. Does have edema at times. Currently heart failure sx and sx are stable. Provided education about placing scale on hard, flat surface Advised patient to weigh each morning after emptying bladder Discussed  importance of daily weight and advised patient to weigh and record daily Reviewed role of diuretics in prevention of fluid overload and management of heart failure Discussed the importance of keeping all appointments with provider. Review of calling the provider for changes, questions, or concerns. Provided patient with education about the role of exercise in the management of heart failure Advised patient to discuss changes in heart health with provider  Symptom Management: Take medications as prescribed   Attend all scheduled provider appointments Call pharmacy for medication refills 3-7 days in advance of running out of medications Perform IADL's (shopping, preparing meals, housekeeping, managing finances) independently Call provider office for new concerns or questions  Work with the social worker to address care coordination needs and will continue to work with the clinical team to address health care and disease management related needs call the Suicide and Crisis Lifeline: 988 call the [place] National Suicide Prevention Lifeline: 9341872124 or TTY: 601-269-9887 TTY 805-137-9499) to talk to a trained counselor call 1-800-273-TALK (toll free, 24 hour hotline) if experiencing a Mental Health or [place]  call office if I gain more than 2 pounds in one day or 5 pounds in one week keep legs up while sitting use salt in moderation watch for  swelling in feet, ankles and legs every day weigh myself daily develop a rescue plan follow rescue plan if symptoms flare-up eat more whole grains, fruits and vegetables, lean meats and healthy fats track symptoms and what helps feel better or worse dress right for the weather, hot or cold  Follow Up Plan: Telephone follow up appointment with care management team member scheduled for: 05-17-2022 at 230 pm          Plan:Telephone follow up appointment with care management team member scheduled for:  05-17-2022 at 230 pm  [place], MSN, CCM RN Care Manager  Chronic Care Management Direct Number: [phone]

## 2022-04-05 NOTE — Addendum Note (Signed)
Addended by: Orinda Kenner on: 04/05/2022 04:43 PM   Modules accepted: Orders

## 2022-04-05 NOTE — Progress Notes (Signed)
Subjective:    Patient ID: Beth [name], female    DOB: October 31, 1945, 77 y.o.   MRN: [id number]  Beth [name] is a 77 y.o. female presenting on 04/05/2022 for Back Pain and burning with urination  Patient presents for a same day appointment.   HPI  UTI vs Pyelonephritis  Recent hospitalization 03/23/22 for left sided arm and leg numbness and weakness with falls She had extensive testing and MRI imaging, neurology consult, no identified TIA or CVA  Reports symptoms started with UTI symptoms burning dysuria and RUQ and back pain.  Last UTI 08/2021 treated in urgent care similar, she has had similar pyelo 3 x in past 1 year.  She also describes RUQ abdominal pain episodic But she is s/p multiple abdominal surgeries. She has R sided pain but no gallbladder or other known etiology  Past Surgical History:  Procedure Laterality Date   ABDOMINAL HYSTERECTOMY     APPENDECTOMY     BREAST EXCISIONAL BIOPSY  2011   Pt states lump removed, ? side, no scar seen   BREAST SURGERY  2011   biopsy   CERVICAL DISCECTOMY     CHOLECYSTECTOMY     SINUSOTOMY         04/05/2022   10:49 AM 03/15/2022    3:21 PM 03/07/2022    3:10 PM  Depression screen PHQ 2/9  Decreased Interest 1 1   Down, Depressed, Hopeless 1 1   PHQ - 2 Score 2 2   Altered sleeping 0 0   Tired, decreased energy 2 2   Change in appetite 0 0   Feeling bad or failure about yourself  3 3   Trouble concentrating 1 1   Moving slowly or fidgety/restless 0 0   Suicidal thoughts 0 0   PHQ-9 Score 8 8   Difficult doing work/chores Somewhat difficult Not difficult at all      Information is confidential and restricted. Go to Review Flowsheets to unlock data.    Social History   Tobacco Use   Smoking status: Former    Packs/day: 0.25    Years: 57.00    Total pack years: 14.25    Types: Cigarettes    Quit date: 10/24/2020    Years since quitting: 1.4   Smokeless tobacco: Never   Tobacco comments:    6-8cig  daily--01/01/2022  Vaping Use   Vaping Use: Never used  Substance Use Topics   Alcohol use: No    Alcohol/week: 0.0 standard drinks of alcohol   Drug use: No    Review of Systems Per HPI unless specifically indicated above     Objective:    BP 122/68   Pulse 70   Ht '5\' 3"'$  (1.6 m)   Wt 147 lb 12.8 oz (67 kg)   SpO2 95%   BMI 26.18 kg/m   Wt Readings from Last 3 Encounters:  04/05/22 147 lb 12.8 oz (67 kg)  03/24/22 145 lb 1 oz (65.8 kg)  03/15/22 150 lb 9.6 oz (68.3 kg)    Physical Exam Vitals and nursing note reviewed.  Constitutional:      General: She is not in acute distress.    Appearance: Normal appearance. She is well-developed. She is not diaphoretic.     Comments: Well-appearing, comfortable, cooperative  HENT:     Head: Normocephalic and atraumatic.  Eyes:     General:        Right eye: No discharge.  Left eye: No discharge.     Conjunctiva/sclera: Conjunctivae normal.  Cardiovascular:     Rate and Rhythm: Normal rate.  Pulmonary:     Effort: Pulmonary effort is normal.  Musculoskeletal:     Comments: R mid back vs RUQ flank pain on exam  Skin:    General: Skin is warm and dry.     Findings: No erythema or rash.  Neurological:     Mental Status: She is alert and oriented to person, place, and time.  Psychiatric:        Mood and Affect: Mood normal.        Behavior: Behavior normal.        Thought Content: Thought content normal.     Comments: Well groomed, good eye contact, normal speech and thoughts       Results for orders placed or performed during the hospital encounter of 03/23/22  Resp panel by RT-PCR (RSV, Flu A&B, Covid) Anterior Nasal Swab   Specimen: Anterior Nasal Swab  Result Value Ref Range   SARS Coronavirus 2 by RT PCR NEGATIVE NEGATIVE   Influenza A by PCR NEGATIVE NEGATIVE   Influenza B by PCR NEGATIVE NEGATIVE   Resp Syncytial Virus by PCR NEGATIVE NEGATIVE  Ethanol  Result Value Ref Range   Alcohol, Ethyl (B) <10  <10 mg/dL  Protime-INR  Result Value Ref Range   Prothrombin Time 14.4 11.4 - 15.2 seconds   INR 1.1 0.8 - 1.2  APTT  Result Value Ref Range   aPTT 24 24 - 36 seconds  CBC  Result Value Ref Range   WBC 12.9 (H) 4.0 - 10.5 K/uL   RBC 3.68 (L) 3.87 - 5.11 MIL/uL   Hemoglobin 11.2 (L) 12.0 - 15.0 g/dL   HCT 35.0 (L) 36.0 - 46.0 %   MCV 95.1 80.0 - 100.0 fL   MCH 30.4 26.0 - 34.0 pg   MCHC 32.0 30.0 - 36.0 g/dL   RDW 15.5 11.5 - 15.5 %   Platelets 219 150 - 400 K/uL   nRBC 0.0 0.0 - 0.2 %  Differential  Result Value Ref Range   Neutrophils Relative % 75 %   Neutro Abs 9.6 (H) 1.7 - 7.7 K/uL   Lymphocytes Relative 13 %   Lymphs Abs 1.7 0.7 - 4.0 K/uL   Monocytes Relative 8 %   Monocytes Absolute 1.0 0.1 - 1.0 K/uL   Eosinophils Relative 3 %   Eosinophils Absolute 0.3 0.0 - 0.5 K/uL   Basophils Relative 0 %   Basophils Absolute 0.0 0.0 - 0.1 K/uL   Immature Granulocytes 1 %   Abs Immature Granulocytes 0.14 (H) 0.00 - 0.07 K/uL  Comprehensive metabolic panel  Result Value Ref Range   Sodium 140 135 - 145 mmol/L   Potassium 4.2 3.5 - 5.1 mmol/L   Chloride 107 98 - 111 mmol/L   CO2 26 22 - 32 mmol/L   Glucose, Bld 123 (H) 70 - 99 mg/dL   BUN 27 (H) 8 - 23 mg/dL   Creatinine, Ser 1.25 (H) 0.44 - 1.00 mg/dL   Calcium 8.9 8.9 - 10.3 mg/dL   Total Protein 6.2 (L) 6.5 - 8.1 g/dL   Albumin 3.1 (L) 3.5 - 5.0 g/dL   AST 39 15 - 41 U/L   ALT 39 0 - 44 U/L   Alkaline Phosphatase 103 38 - 126 U/L   Total Bilirubin 0.8 0.3 - 1.2 mg/dL   GFR, Estimated 44 (L) >60 mL/min  Anion gap 7 5 - 15  Urine Drug Screen, Qualitative  Result Value Ref Range   Tricyclic, Ur Screen POSITIVE (A) NONE DETECTED   Amphetamines, Ur Screen NONE DETECTED NONE DETECTED   MDMA (Ecstasy)Ur Screen NONE DETECTED NONE DETECTED   Cocaine Metabolite,Ur [place] NONE DETECTED NONE DETECTED   Opiate, Ur Screen NONE DETECTED NONE DETECTED   Phencyclidine (PCP) Ur S NONE DETECTED NONE DETECTED   Cannabinoid 50 Ng, Ur  [place] NONE DETECTED NONE DETECTED   Barbiturates, Ur Screen NONE DETECTED NONE DETECTED   Benzodiazepine, Ur Scrn POSITIVE (A) NONE DETECTED   Methadone Scn, Ur NONE DETECTED NONE DETECTED  Urinalysis, Routine w reflex microscopic -Urine, Clean Catch  Result Value Ref Range   Color, Urine YELLOW (A) YELLOW   APPearance CLEAR (A) CLEAR   Specific Gravity, Urine 1.014 1.005 - 1.030   pH 5.0 5.0 - 8.0   Glucose, UA NEGATIVE NEGATIVE mg/dL   Hgb urine dipstick NEGATIVE NEGATIVE   Bilirubin Urine NEGATIVE NEGATIVE   Ketones, ur NEGATIVE NEGATIVE mg/dL   Protein, ur NEGATIVE NEGATIVE mg/dL   Nitrite NEGATIVE NEGATIVE   Leukocytes,Ua NEGATIVE NEGATIVE  TSH  Result Value Ref Range   TSH 3.738 0.350 - 4.500 uIU/mL  Lipid panel  Result Value Ref Range   Cholesterol 133 0 - 200 mg/dL   Triglycerides 40 <150 mg/dL   HDL 52 >40 mg/dL   Total CHOL/HDL Ratio 2.6 RATIO   VLDL 8 0 - 40 mg/dL   LDL Cholesterol 73 0 - 99 mg/dL  Hemoglobin A1c  Result Value Ref Range   Hgb A1c MFr Bld 5.9 (H) 4.8 - 5.6 %   Mean Plasma Glucose 123 mg/dL  Basic metabolic panel  Result Value Ref Range   Sodium 139 135 - 145 mmol/L   Potassium 3.9 3.5 - 5.1 mmol/L   Chloride 104 98 - 111 mmol/L   CO2 28 22 - 32 mmol/L   Glucose, Bld 102 (H) 70 - 99 mg/dL   BUN 23 8 - 23 mg/dL   Creatinine, Ser 1.11 (H) 0.44 - 1.00 mg/dL   Calcium 8.6 (L) 8.9 - 10.3 mg/dL   GFR, Estimated 51 (L) >60 mL/min   Anion gap 7 5 - 15  CBC  Result Value Ref Range   WBC 12.2 (H) 4.0 - 10.5 K/uL   RBC 3.38 (L) 3.87 - 5.11 MIL/uL   Hemoglobin 10.2 (L) 12.0 - 15.0 g/dL   HCT 32.4 (L) 36.0 - 46.0 %   MCV 95.9 80.0 - 100.0 fL   MCH 30.2 26.0 - 34.0 pg   MCHC 31.5 30.0 - 36.0 g/dL   RDW 15.5 11.5 - 15.5 %   Platelets 198 150 - 400 K/uL   nRBC 0.0 0.0 - 0.2 %  Brain natriuretic peptide  Result Value Ref Range   B Natriuretic Peptide 209.5 (H) 0.0 - 100.0 pg/mL  ECHOCARDIOGRAM COMPLETE BUBBLE STUDY  Result Value Ref Range   Ao pk vel  1.73 m/s   AV Area VTI 1.43 cm2   AR max vel 1.44 cm2   AV Mean grad 6.0 mmHg   AV Peak grad 11.9 mmHg   S' Lateral 3.10 cm   AV Area mean vel 1.46 cm2   Area-P 1/2 3.74 cm2   P 1/2 time 695 msec   MV M vel 5.78 m/s   MV Peak grad 133.6 mmHg   Radius 1.30 cm   Est EF 60 - 65%   Troponin I (High  Sensitivity)  Result Value Ref Range   Troponin I (High Sensitivity) 6 <18 ng/L  Troponin I (High Sensitivity)  Result Value Ref Range   Troponin I (High Sensitivity) 8 <18 ng/L      Assessment & Plan:   Problem List Items Addressed This Visit     Urinary tract infection - Primary   Relevant Medications   fluconazole (DIFLUCAN) 150 MG tablet   ciprofloxacin (CIPRO) 500 MG tablet   HYDROcodone-acetaminophen (NORCO/VICODIN) 5-325 MG tablet   Other Visit Diagnoses     Antibiotic-induced yeast infection       Relevant Medications   fluconazole (DIFLUCAN) 150 MG tablet   Nausea       Relevant Medications   ondansetron (ZOFRAN-ODT) 4 MG disintegrating tablet       Clinically consistent with UTI Unable to confirm on UA, unable to leave urine sample. She has similar symptoms to previous episodes of pyelonephritis x 3 now in past 1 year R Flank back pain and dysuria Afebrile  Start Cipro antibiotic '500mg'$  TWICE A DAY x 7 day to cover for UTI and possibly kidney infection Agree to re order Pain med and nausea med as needed Discussed concern with opiate and her other medications including BDZ If yeast infection after can take Diflucan  If not improving please seek care at hospital ED again for this may need IV antibiotics or other treatment. If recurrent issue we will refer to Urology    Meds ordered this encounter  Medications   fluconazole (DIFLUCAN) 150 MG tablet    Sig: Take one tablet by mouth on Day 1. Repeat dose 2nd tablet on Day 3.    Dispense:  2 tablet    Refill:  0   ciprofloxacin (CIPRO) 500 MG tablet    Sig: Take 1 tablet (500 mg total) by mouth 2 (two) times  daily for 7 days.    Dispense:  14 tablet    Refill:  0   ondansetron (ZOFRAN-ODT) 4 MG disintegrating tablet    Sig: Take 1 tablet (4 mg total) by mouth every 8 (eight) hours as needed for nausea or vomiting.    Dispense:  30 tablet    Refill:  0   HYDROcodone-acetaminophen (NORCO/VICODIN) 5-325 MG tablet    Sig: Take 1 tablet by mouth every 6 (six) hours as needed for moderate pain.    Dispense:  20 tablet    Refill:  0      Follow up plan: Return if symptoms worsen or fail to improve.   Nobie Putnam, [facility] 04/05/2022, 10:51 AM

## 2022-04-05 NOTE — Patient Instructions (Addendum)
Thank you for coming to the office today.  Start Cipro antibiotic to cover for UTI and possibly kidney infection  Pain med and nausea med as needed  If yeast infection after can take Diflucan  If not improving please seek care at hospital ED again for this may need IV antibiotics or other treatment.  If recurrent issue we will refer to Urology  Pyelonephritis, Adult Pyelonephritis is an infection that occurs in the kidney. The kidneys are the organs that filter the blood and move waste from the bloodstream to the urine. Urine passes out of the kidneys through tubes called ureters and goes into the bladder. There are two main types of this condition: Acute pyelonephritis. These are infections that come on quickly and without any warning. Chronic pyelonephritis. These infections last for a long time. In most cases, the infection clears up with treatment. In more severe cases, an infection can spread to the bloodstream or lead to other problems with the kidneys. What are the causes? This condition is often caused by bacteria. The bacteria may travel: From the bladder up to the kidney. This may happen after you have a bladder infection (cystitis) or urinary tract infection (UTI). From the bloodstream to the kidney. What increases the risk? You are more likely to develop this condition if: You are female. Your risk is even higher if you are pregnant. You are older. You have: Diabetes. Prostatitis. This is inflammation of the prostate gland. Kidney stones or bladder stones. Problems with your kidneys or ureters. Cancer. Spinal cord injury or nerve damage around the bladder. You have a soft tube (catheter) placed in your bladder. You are sexually active and use spermicides. You have or have had a UTI. What are the signs or symptoms? Symptoms of this condition include: An urge to urinate that is strong or does not go away. You may also urinate more often than normal. A burning or  stinging feeling when you urinate. Pain. This may be in your abdomen, back, side, or groin. Fever or chills. Nausea or vomiting. Urine that is bloody, dark, cloudy, or smells bad. How is this diagnosed? This condition may be diagnosed based on your medical history and a physical exam. You may also have tests, such as: Urine tests. Blood tests. Imaging tests of the kidneys. These may include an ultrasound or CT scan. How is this treated? Treatment for this condition depends on the severity of the infection. If the infection is mild and found early, you may be given antibiotics to take by mouth. You will need to drink lots of fluids. If the infection is more severe, you may need to stay in the hospital and receive antibiotics through an IV. You may also get fluids through an IV. After you leave the hospital, you may need to take antibiotics by mouth. Other treatments may be needed. These will depend on the cause of the infection. Follow these instructions at home: Eating and drinking Drink enough fluid to keep your urine pale yellow. Avoid caffeine, tea, and carbonated drinks. These can irritate the bladder. General instructions Take over-the-counter and prescription medicines as told by your health care provider. Finish your antibiotics even if you start to feel better. Urinate often. Avoid holding in urine for long periods of time. Urinate before and after sex. If you are female, cleanse from front to back after a bowel movement. Use each tissue only once. Keep all follow-up visits. Your health care provider will want to make sure your infection is  gone. Contact a health care provider if: Your symptoms do not get better after 2 days of treatment. Your symptoms get worse. You have a fever or chills. You cannot take your antibiotics. Get help right away if: You vomit each time that you eat or drink. You have severe pain in your back or side. You are very weak, or you faint. This  information is not intended to replace advice given to you by your health care provider. Make sure you discuss any questions you have with your health care provider. Document Revised: 08/06/2021 Document Reviewed: 08/06/2021 Elsevier Patient Education  [place].   Please schedule a Follow-up Appointment to: Return if symptoms worsen or fail to improve.  If you have any other questions or concerns, please feel free to call the office or send a message through [place]. You may also schedule an earlier appointment if necessary.  Additionally, you may be receiving a survey about your experience at our office within a few days to 1 week by e-mail or mail. We value your feedback.  Nobie Putnam, DO [employer]

## 2022-04-05 NOTE — Patient Instructions (Signed)
Please call the care guide team at [phone] if you need to cancel or reschedule your appointment.   If you are experiencing a Mental Health or [place] or need someone to talk to, please call the Suicide and Crisis Lifeline: 988 call the [place] National Suicide Prevention Lifeline: 419-143-2514 or TTY: 762-606-8762 TTY 253-426-7502) to talk to a trained counselor call 1-800-273-TALK (toll free, 24 hour hotline)   Following is a copy of the CCM Program Consent:  CCM service includes personalized support from designated clinical staff supervised by the physician, including individualized plan of care and coordination with other care providers 24/7 contact phone numbers for assistance for urgent and routine care needs. Service will only be billed when office clinical staff spend 20 minutes or more in a month to coordinate care. Only one practitioner may furnish and bill the service in a calendar month. The patient may stop CCM services at amy time (effective at the end of the month) by phone call to the office staff. The patient will be responsible for cost sharing (co-pay) or up to 20% of the service fee (after annual deductible is met)  Following is a copy of your full provider care plan:   Goals Addressed             This Visit's Progress    CCM Expected Outcome:  Monitor, Self-Manage and Reduce Symptoms of  Depression and anxiety       Current Barriers:  Care Coordination needs related to ongoing support and education needs for mental health  in a patient with depression and anxiety Chronic Disease Management support and education needs related to effective management of depression  Lacks caregiver support.   Planned Interventions: Evaluation of current treatment plan related to depression and anxiety and patient's adherence to plan as established by provider. The patient last spoke to her counselor on 03-07-2022. The patient was in the hospital from 03-23-2022 to  03-25-2022 to rule out a stroke.  She had left sided weakness and 3 falls at home. The patient is happy to be out of the hospital. Saw pcp today. The patient currently has a UTI. She has not received notification that her medications are ready for pick up yet. Encouraged her to call this afternoon if she had not heard from them soon. Review of taking all of her medications as prescribed and monitoring for changes in her sx and sx to call the provider. She says overall she is stable. Continues to have a flat affect when speaking with the [facility] but pleasant and answers questions accordingly. Appreciative of the call and the help from the team. Reflective listening and support given.  Advised patient to call the office for changes in mental health, mood, anxiety, and depression. The patient is thankful for the support of the counselor and feels she can be open and honest about her mental health and well being. Encouraged her to call her counselor or the [facility] for changes or new needs related to health and well being.   Provided education to patient re: keeping provider appointments, working with specialist for mental  health needs, and working with the CCM team to effectively manage mental health and well being Reviewed medications with patient and discussed compliance. The patient has her medications and no issues with getting medications. States compliance with medications. Review of medications from pcp visit today. The patient states she will take as directed.  Collaborated with pcp, and LCSW regarding effective management of depression and mental health  needs  Discussed plans with patient for ongoing care management follow up and provided patient with direct contact information for care management team Advised patient to discuss changes in her mental health and well being  with provider Screening for signs and symptoms of depression related to chronic disease state  Assessed social determinant of health  barriers The patient is working with home health in her home. She is hopeful this will help her regain her strength. She feels safe and states she is staying by herself. Review of needs and the patient declines any needs at this time. Encouraged the patient to call for changes or concerns. Review of fall prevention and safety. The patient is thankful to be home from the hospital and just wants to feel better from the UTI.  Education on what worse looks like and to call for changes or seek emergent help.   Symptom Management: Attend all scheduled provider appointments Perform IADL's (shopping, preparing meals, housekeeping, managing finances) independently Call provider office for new concerns or questions  call the Suicide and Crisis Lifeline: 988 call the [place] National Suicide Prevention Lifeline: 313 586 4802 or TTY: 587-204-3882 TTY 601-702-7098) to talk to a trained counselor call 1-800-273-TALK (toll free, 24 hour hotline) if experiencing a Mental Health or [place]   Follow Up Plan: Telephone follow up appointment with care management team member scheduled for: 05-17-2022 at 230 pm       CCM Expected Outcome:  Monitor, Self-Manage and Reduce Symptoms of Heart Failure       Current Barriers:  Knowledge Deficits related to sx and sx management of HF Care Coordination needs related to ongoing support and education needs  in a patient with HF Chronic Disease Management support and education needs related to effective management of HF Lacks caregiver support.  Transportation barriers  Planned Interventions: Basic overview and discussion of pathophysiology of Heart Failure reviewed. The patient denies any new issues with her heart failure. She was recently in the hospital for left sided weakness and fall x 3 at home. Hospital stay 03-23-2022 to 03-25-2022. The patient saw the pcp today for a UTI. The patient states she has had pain on urination and just over all not feeling  well. Home Health is coming into the home and she says she feels like this will help her. It was recommended she go to SNF but the patient declined. Education and support given.  Provided education on low sodium diet. Review of monitoring dietary restrictions and making sure she stays adequately hydrated. The patient states she is eating and drinking and monitoring for changes. Reviewed Heart Failure Action Plan in depth Assessed need for readable accurate scales in home. The patient has a scales. Knows how to manage heart failure. Does have edema at times. Currently heart failure sx and sx are stable. Provided education about placing scale on hard, flat surface Advised patient to weigh each morning after emptying bladder Discussed importance of daily weight and advised patient to weigh and record daily Reviewed role of diuretics in prevention of fluid overload and management of heart failure Discussed the importance of keeping all appointments with provider. Review of calling the provider for changes, questions, or concerns. Provided patient with education about the role of exercise in the management of heart failure Advised patient to discuss changes in heart health with provider  Symptom Management: Take medications as prescribed   Attend all scheduled provider appointments Call pharmacy for medication refills 3-7 days in advance of running out of medications  Perform IADL's (shopping, preparing meals, housekeeping, managing finances) independently Call provider office for new concerns or questions  Work with the social worker to address care coordination needs and will continue to work with the clinical team to address health care and disease management related needs call the Suicide and Crisis Lifeline: 988 call the [place] National Suicide Prevention Lifeline: (704)838-7721 or TTY: (312)237-5311 TTY 7854629537) to talk to a trained counselor call 1-800-273-TALK (toll free, 24 hour hotline)  if experiencing a Mental Health or [place]  call office if I gain more than 2 pounds in one day or 5 pounds in one week keep legs up while sitting use salt in moderation watch for swelling in feet, ankles and legs every day weigh myself daily develop a rescue plan follow rescue plan if symptoms flare-up eat more whole grains, fruits and vegetables, lean meats and healthy fats track symptoms and what helps feel better or worse dress right for the weather, hot or cold  Follow Up Plan: Telephone follow up appointment with care management team member scheduled for: 05-17-2022 at 230 pm          The patient verbalized understanding of instructions, educational materials, and care plan provided today and DECLINED offer to receive copy of patient instructions, educational materials, and care plan.   Telephone follow up appointment with care management team member scheduled for: 05-17-2022 at 230 pm

## 2022-04-08 ENCOUNTER — Telehealth: Payer: Self-pay | Admitting: Family Medicine

## 2022-04-08 DIAGNOSIS — I509 Heart failure, unspecified: Secondary | ICD-10-CM | POA: Diagnosis not present

## 2022-04-08 DIAGNOSIS — K219 Gastro-esophageal reflux disease without esophagitis: Secondary | ICD-10-CM | POA: Diagnosis not present

## 2022-04-08 DIAGNOSIS — N1831 Chronic kidney disease, stage 3a: Secondary | ICD-10-CM | POA: Diagnosis not present

## 2022-04-08 DIAGNOSIS — M797 Fibromyalgia: Secondary | ICD-10-CM | POA: Diagnosis not present

## 2022-04-08 DIAGNOSIS — Z8673 Personal history of transient ischemic attack (TIA), and cerebral infarction without residual deficits: Secondary | ICD-10-CM | POA: Diagnosis not present

## 2022-04-08 DIAGNOSIS — E039 Hypothyroidism, unspecified: Secondary | ICD-10-CM | POA: Diagnosis not present

## 2022-04-08 DIAGNOSIS — D509 Iron deficiency anemia, unspecified: Secondary | ICD-10-CM | POA: Diagnosis not present

## 2022-04-08 DIAGNOSIS — I130 Hypertensive heart and chronic kidney disease with heart failure and stage 1 through stage 4 chronic kidney disease, or unspecified chronic kidney disease: Secondary | ICD-10-CM | POA: Diagnosis not present

## 2022-04-08 DIAGNOSIS — J4489 Other specified chronic obstructive pulmonary disease: Secondary | ICD-10-CM | POA: Diagnosis not present

## 2022-04-08 DIAGNOSIS — I2721 Secondary pulmonary arterial hypertension: Secondary | ICD-10-CM | POA: Diagnosis not present

## 2022-04-08 DIAGNOSIS — E7800 Pure hypercholesterolemia, unspecified: Secondary | ICD-10-CM | POA: Diagnosis not present

## 2022-04-08 DIAGNOSIS — Z7982 Long term (current) use of aspirin: Secondary | ICD-10-CM | POA: Diagnosis not present

## 2022-04-08 DIAGNOSIS — J439 Emphysema, unspecified: Secondary | ICD-10-CM | POA: Diagnosis not present

## 2022-04-08 DIAGNOSIS — M5136 Other intervertebral disc degeneration, lumbar region: Secondary | ICD-10-CM | POA: Diagnosis not present

## 2022-04-08 DIAGNOSIS — I083 Combined rheumatic disorders of mitral, aortic and tricuspid valves: Secondary | ICD-10-CM | POA: Diagnosis not present

## 2022-04-08 DIAGNOSIS — K589 Irritable bowel syndrome without diarrhea: Secondary | ICD-10-CM | POA: Diagnosis not present

## 2022-04-08 DIAGNOSIS — M47812 Spondylosis without myelopathy or radiculopathy, cervical region: Secondary | ICD-10-CM | POA: Diagnosis not present

## 2022-04-08 DIAGNOSIS — Z87891 Personal history of nicotine dependence: Secondary | ICD-10-CM | POA: Diagnosis not present

## 2022-04-08 DIAGNOSIS — Z9981 Dependence on supplemental oxygen: Secondary | ICD-10-CM | POA: Diagnosis not present

## 2022-04-08 DIAGNOSIS — G8929 Other chronic pain: Secondary | ICD-10-CM | POA: Diagnosis not present

## 2022-04-08 DIAGNOSIS — I700 Atherosclerosis of aorta: Secondary | ICD-10-CM | POA: Diagnosis not present

## 2022-04-08 DIAGNOSIS — M1990 Unspecified osteoarthritis, unspecified site: Secondary | ICD-10-CM | POA: Diagnosis not present

## 2022-04-08 DIAGNOSIS — I4891 Unspecified atrial fibrillation: Secondary | ICD-10-CM | POA: Diagnosis not present

## 2022-04-08 NOTE — Telephone Encounter (Signed)
Pt is calling to receive her UA results please advise [phone]

## 2022-04-09 ENCOUNTER — Telehealth: Payer: Self-pay | Admitting: Family Medicine

## 2022-04-09 DIAGNOSIS — I130 Hypertensive heart and chronic kidney disease with heart failure and stage 1 through stage 4 chronic kidney disease, or unspecified chronic kidney disease: Secondary | ICD-10-CM | POA: Diagnosis not present

## 2022-04-09 DIAGNOSIS — I4891 Unspecified atrial fibrillation: Secondary | ICD-10-CM | POA: Diagnosis not present

## 2022-04-09 DIAGNOSIS — M47812 Spondylosis without myelopathy or radiculopathy, cervical region: Secondary | ICD-10-CM | POA: Diagnosis not present

## 2022-04-09 DIAGNOSIS — E7800 Pure hypercholesterolemia, unspecified: Secondary | ICD-10-CM | POA: Diagnosis not present

## 2022-04-09 DIAGNOSIS — Z7982 Long term (current) use of aspirin: Secondary | ICD-10-CM | POA: Diagnosis not present

## 2022-04-09 DIAGNOSIS — K219 Gastro-esophageal reflux disease without esophagitis: Secondary | ICD-10-CM | POA: Diagnosis not present

## 2022-04-09 DIAGNOSIS — Z87891 Personal history of nicotine dependence: Secondary | ICD-10-CM | POA: Diagnosis not present

## 2022-04-09 DIAGNOSIS — I700 Atherosclerosis of aorta: Secondary | ICD-10-CM | POA: Diagnosis not present

## 2022-04-09 DIAGNOSIS — M1990 Unspecified osteoarthritis, unspecified site: Secondary | ICD-10-CM | POA: Diagnosis not present

## 2022-04-09 DIAGNOSIS — M5136 Other intervertebral disc degeneration, lumbar region: Secondary | ICD-10-CM | POA: Diagnosis not present

## 2022-04-09 DIAGNOSIS — E039 Hypothyroidism, unspecified: Secondary | ICD-10-CM | POA: Diagnosis not present

## 2022-04-09 DIAGNOSIS — M797 Fibromyalgia: Secondary | ICD-10-CM | POA: Diagnosis not present

## 2022-04-09 DIAGNOSIS — J4489 Other specified chronic obstructive pulmonary disease: Secondary | ICD-10-CM | POA: Diagnosis not present

## 2022-04-09 DIAGNOSIS — N1831 Chronic kidney disease, stage 3a: Secondary | ICD-10-CM | POA: Diagnosis not present

## 2022-04-09 DIAGNOSIS — Z9981 Dependence on supplemental oxygen: Secondary | ICD-10-CM | POA: Diagnosis not present

## 2022-04-09 DIAGNOSIS — K589 Irritable bowel syndrome without diarrhea: Secondary | ICD-10-CM | POA: Diagnosis not present

## 2022-04-09 DIAGNOSIS — I509 Heart failure, unspecified: Secondary | ICD-10-CM | POA: Diagnosis not present

## 2022-04-09 DIAGNOSIS — I083 Combined rheumatic disorders of mitral, aortic and tricuspid valves: Secondary | ICD-10-CM | POA: Diagnosis not present

## 2022-04-09 DIAGNOSIS — I2721 Secondary pulmonary arterial hypertension: Secondary | ICD-10-CM | POA: Diagnosis not present

## 2022-04-09 DIAGNOSIS — J439 Emphysema, unspecified: Secondary | ICD-10-CM | POA: Diagnosis not present

## 2022-04-09 DIAGNOSIS — Z8673 Personal history of transient ischemic attack (TIA), and cerebral infarction without residual deficits: Secondary | ICD-10-CM | POA: Diagnosis not present

## 2022-04-09 DIAGNOSIS — G8929 Other chronic pain: Secondary | ICD-10-CM | POA: Diagnosis not present

## 2022-04-09 DIAGNOSIS — D509 Iron deficiency anemia, unspecified: Secondary | ICD-10-CM | POA: Diagnosis not present

## 2022-04-09 NOTE — Telephone Encounter (Signed)
Called Marcie Bal back. She has to report the med interaction. It is with Seroquel. Patient is only final 2 doses of Cipro, has taken before. She overall does better. But they reported dizziness as well. This seems to be not new, as she has had dizziness ongoing in 04/2021 and 08/2021. Her O2 Lvl was 93%, we discussed this is baseline range for her.  No changes made at this time. OT will work on her dizziness as well.  Nobie Putnam, [facility] 04/09/2022, 1:13 PM

## 2022-04-09 NOTE — Telephone Encounter (Signed)
I just tried calling the pt. I left a message to return my call.

## 2022-04-09 NOTE — Telephone Encounter (Signed)
Beth [name] with [facility] states that the pt experiencing dizziness. Pt did get a new prescription on 04/05/22 ciprofloxacin (CIPRO) 500 MG tablet  and it is flagging a contraindication with her other medication.  Please call Beth [name] back @ [phone] (this is a secured phone number can leave a voice mail.)

## 2022-04-15 DIAGNOSIS — K219 Gastro-esophageal reflux disease without esophagitis: Secondary | ICD-10-CM | POA: Diagnosis not present

## 2022-04-15 DIAGNOSIS — J4489 Other specified chronic obstructive pulmonary disease: Secondary | ICD-10-CM | POA: Diagnosis not present

## 2022-04-15 DIAGNOSIS — Z7982 Long term (current) use of aspirin: Secondary | ICD-10-CM | POA: Diagnosis not present

## 2022-04-15 DIAGNOSIS — M1990 Unspecified osteoarthritis, unspecified site: Secondary | ICD-10-CM | POA: Diagnosis not present

## 2022-04-15 DIAGNOSIS — G8929 Other chronic pain: Secondary | ICD-10-CM | POA: Diagnosis not present

## 2022-04-15 DIAGNOSIS — M797 Fibromyalgia: Secondary | ICD-10-CM | POA: Diagnosis not present

## 2022-04-15 DIAGNOSIS — I700 Atherosclerosis of aorta: Secondary | ICD-10-CM | POA: Diagnosis not present

## 2022-04-15 DIAGNOSIS — E7800 Pure hypercholesterolemia, unspecified: Secondary | ICD-10-CM | POA: Diagnosis not present

## 2022-04-15 DIAGNOSIS — I2721 Secondary pulmonary arterial hypertension: Secondary | ICD-10-CM | POA: Diagnosis not present

## 2022-04-15 DIAGNOSIS — J439 Emphysema, unspecified: Secondary | ICD-10-CM | POA: Diagnosis not present

## 2022-04-15 DIAGNOSIS — Z8673 Personal history of transient ischemic attack (TIA), and cerebral infarction without residual deficits: Secondary | ICD-10-CM | POA: Diagnosis not present

## 2022-04-15 DIAGNOSIS — E039 Hypothyroidism, unspecified: Secondary | ICD-10-CM | POA: Diagnosis not present

## 2022-04-15 DIAGNOSIS — I130 Hypertensive heart and chronic kidney disease with heart failure and stage 1 through stage 4 chronic kidney disease, or unspecified chronic kidney disease: Secondary | ICD-10-CM | POA: Diagnosis not present

## 2022-04-15 DIAGNOSIS — Z9981 Dependence on supplemental oxygen: Secondary | ICD-10-CM | POA: Diagnosis not present

## 2022-04-15 DIAGNOSIS — Z87891 Personal history of nicotine dependence: Secondary | ICD-10-CM | POA: Diagnosis not present

## 2022-04-15 DIAGNOSIS — I4891 Unspecified atrial fibrillation: Secondary | ICD-10-CM | POA: Diagnosis not present

## 2022-04-15 DIAGNOSIS — K589 Irritable bowel syndrome without diarrhea: Secondary | ICD-10-CM | POA: Diagnosis not present

## 2022-04-15 DIAGNOSIS — I509 Heart failure, unspecified: Secondary | ICD-10-CM | POA: Diagnosis not present

## 2022-04-15 DIAGNOSIS — I083 Combined rheumatic disorders of mitral, aortic and tricuspid valves: Secondary | ICD-10-CM | POA: Diagnosis not present

## 2022-04-15 DIAGNOSIS — N1831 Chronic kidney disease, stage 3a: Secondary | ICD-10-CM | POA: Diagnosis not present

## 2022-04-15 DIAGNOSIS — D509 Iron deficiency anemia, unspecified: Secondary | ICD-10-CM | POA: Diagnosis not present

## 2022-04-15 DIAGNOSIS — M5136 Other intervertebral disc degeneration, lumbar region: Secondary | ICD-10-CM | POA: Diagnosis not present

## 2022-04-15 DIAGNOSIS — M47812 Spondylosis without myelopathy or radiculopathy, cervical region: Secondary | ICD-10-CM | POA: Diagnosis not present

## 2022-04-17 ENCOUNTER — Other Ambulatory Visit: Payer: Self-pay | Admitting: Family Medicine

## 2022-04-17 DIAGNOSIS — I700 Atherosclerosis of aorta: Secondary | ICD-10-CM | POA: Diagnosis not present

## 2022-04-17 DIAGNOSIS — M1990 Unspecified osteoarthritis, unspecified site: Secondary | ICD-10-CM | POA: Diagnosis not present

## 2022-04-17 DIAGNOSIS — Z9981 Dependence on supplemental oxygen: Secondary | ICD-10-CM | POA: Diagnosis not present

## 2022-04-17 DIAGNOSIS — I2721 Secondary pulmonary arterial hypertension: Secondary | ICD-10-CM | POA: Diagnosis not present

## 2022-04-17 DIAGNOSIS — J439 Emphysema, unspecified: Secondary | ICD-10-CM | POA: Diagnosis not present

## 2022-04-17 DIAGNOSIS — K219 Gastro-esophageal reflux disease without esophagitis: Secondary | ICD-10-CM | POA: Diagnosis not present

## 2022-04-17 DIAGNOSIS — I509 Heart failure, unspecified: Secondary | ICD-10-CM | POA: Diagnosis not present

## 2022-04-17 DIAGNOSIS — Z87891 Personal history of nicotine dependence: Secondary | ICD-10-CM | POA: Diagnosis not present

## 2022-04-17 DIAGNOSIS — J4489 Other specified chronic obstructive pulmonary disease: Secondary | ICD-10-CM | POA: Diagnosis not present

## 2022-04-17 DIAGNOSIS — N1831 Chronic kidney disease, stage 3a: Secondary | ICD-10-CM | POA: Diagnosis not present

## 2022-04-17 DIAGNOSIS — E039 Hypothyroidism, unspecified: Secondary | ICD-10-CM | POA: Diagnosis not present

## 2022-04-17 DIAGNOSIS — Z8673 Personal history of transient ischemic attack (TIA), and cerebral infarction without residual deficits: Secondary | ICD-10-CM | POA: Diagnosis not present

## 2022-04-17 DIAGNOSIS — K589 Irritable bowel syndrome without diarrhea: Secondary | ICD-10-CM | POA: Diagnosis not present

## 2022-04-17 DIAGNOSIS — N12 Tubulo-interstitial nephritis, not specified as acute or chronic: Secondary | ICD-10-CM

## 2022-04-17 DIAGNOSIS — D509 Iron deficiency anemia, unspecified: Secondary | ICD-10-CM | POA: Diagnosis not present

## 2022-04-17 DIAGNOSIS — E7800 Pure hypercholesterolemia, unspecified: Secondary | ICD-10-CM | POA: Diagnosis not present

## 2022-04-17 DIAGNOSIS — Z7982 Long term (current) use of aspirin: Secondary | ICD-10-CM | POA: Diagnosis not present

## 2022-04-17 DIAGNOSIS — M797 Fibromyalgia: Secondary | ICD-10-CM | POA: Diagnosis not present

## 2022-04-17 DIAGNOSIS — M47812 Spondylosis without myelopathy or radiculopathy, cervical region: Secondary | ICD-10-CM | POA: Diagnosis not present

## 2022-04-17 DIAGNOSIS — M5136 Other intervertebral disc degeneration, lumbar region: Secondary | ICD-10-CM | POA: Diagnosis not present

## 2022-04-17 DIAGNOSIS — G8929 Other chronic pain: Secondary | ICD-10-CM | POA: Diagnosis not present

## 2022-04-17 DIAGNOSIS — I130 Hypertensive heart and chronic kidney disease with heart failure and stage 1 through stage 4 chronic kidney disease, or unspecified chronic kidney disease: Secondary | ICD-10-CM | POA: Diagnosis not present

## 2022-04-17 DIAGNOSIS — I4891 Unspecified atrial fibrillation: Secondary | ICD-10-CM | POA: Diagnosis not present

## 2022-04-17 DIAGNOSIS — I083 Combined rheumatic disorders of mitral, aortic and tricuspid valves: Secondary | ICD-10-CM | POA: Diagnosis not present

## 2022-04-17 NOTE — Telephone Encounter (Unsigned)
Copied from [place] [id number]. Topic: General - Other >> Apr 17, 2022  2:34 PM Everette C wrote: Medication Refill - Medication: HYDROcodone-acetaminophen (NORCO/VICODIN) 5-325 MG tablet [id number]  Has the patient contacted their pharmacy? No. (Agent: If no, request that the patient contact the pharmacy for the refill. If patient does not wish to contact the pharmacy document the reason why and proceed with request.) (Agent: If yes, when and what did the pharmacy advise?)  Preferred Pharmacy (with phone number or street name): [facility] [place], [place] [address] [place] [address] Phone: [phone] Fax: [phone] Hours: Not open 24 hours   Has the patient been seen for an appointment in the last year OR does the patient have an upcoming appointment? Yes.    Agent: Please be advised that RX refills may take up to 3 business days. We ask that you follow-up with your pharmacy.

## 2022-04-18 NOTE — Telephone Encounter (Signed)
Requested medication (s) are due for refill today - provider review   Requested medication (s) are on the active medication list -yes  Future visit scheduled -no  Last refill: 04/05/22 #20  Notes to clinic: non delegated Rx  Requested Prescriptions  Pending Prescriptions Disp Refills   HYDROcodone-acetaminophen (NORCO/VICODIN) 5-325 MG tablet 20 tablet 0    Sig: Take 1 tablet by mouth every 6 (six) hours as needed for moderate pain.     Not Delegated - Analgesics:  Opioid Agonist Combinations Failed - 04/17/2022  4:24 PM      Failed - This refill cannot be delegated      Passed - Urine Drug Screen completed in last 360 days      Passed - Valid encounter within last 3 months    Recent Outpatient Visits           1 week ago Pyelonephritis   [place], DO   1 month ago Chronic right-sided low back pain with right-sided sciatica   [facility] [name], DO   2 months ago Acute bronchitis with COPD [facility])   [facility] Forrest, [name], DO   5 months ago COPD, severe [facility])   [facility] Delles, Grayland Ormond A, RPH-CPP   6 months ago Nausea   [facility] Mecum, Erin E, [place]                 Requested Prescriptions  Pending Prescriptions Disp Refills   HYDROcodone-acetaminophen (NORCO/VICODIN) 5-325 MG tablet 20 tablet 0    Sig: Take 1 tablet by mouth every 6 (six) hours as needed for moderate pain.     Not Delegated - Analgesics:  Opioid Agonist Combinations Failed - 04/17/2022  4:24 PM      Failed - This refill cannot be delegated      Passed - Urine Drug Screen completed in last 360 days      Passed - Valid encounter within last 3 months    Recent Outpatient Visits           1 week ago Pyelonephritis   [place], DO   1 month  ago Chronic right-sided low back pain with right-sided sciatica   [place], DO   2 months ago Acute bronchitis with COPD [facility])   [place], DO   5 months ago COPD, severe [facility])   [place], Grayland Ormond A, RPH-CPP   6 months ago Nausea   [facility] Mecum, Dani Gobble, [place]

## 2022-04-19 ENCOUNTER — Ambulatory Visit: Payer: Self-pay | Admitting: *Deleted

## 2022-04-19 NOTE — Telephone Encounter (Addendum)
Pt called in requesting an update on her medication refill. Pt was transferred to NT as she stated she was in having severe lower back pain.  Please see TE from NT 03/22.     Please advise.

## 2022-04-19 NOTE — Telephone Encounter (Signed)
  Chief Complaint: low back pain medication request  Symptoms: low back pain severe, upper thighs and hips. Recently discharged from hospital. Can walk. Reports pharmacy reports PCP has not sent Rx for refills. Tylenol not effective constant pain Frequency: na Pertinent Negatives: Patient denies issues with B/B no sudden pain. Disposition: [] ED /[] Urgent Care (no appt availability in office) / [] Appointment(In office/virtual)/ []  [place] [facility]/ [] Home Care/ [x] Refused Recommended Disposition /[] [place] [facility]/ [x]  Follow-up with PCP Additional Notes:   Recommended appt. Patient reports she is unable to come in for OV.  Please advise regarding hydrocodone      Reason for Disposition  [1] SEVERE back pain (e.g., excruciating, unable to do any normal activities) AND [2] not improved 2 hours after pain medicine  Answer Assessment - Initial Assessment Questions 1. ONSET: "When did the pain begin?"      Long time 2. LOCATION: "Where does it hurt?" (upper, mid or lower back)     Low back  3. SEVERITY: "How bad is the pain?"  (e.g., Scale 1-10; mild, moderate, or severe)   - MILD (1-3): Doesn't interfere with normal activities.    - MODERATE (4-7): Interferes with normal activities or awakens from sleep.    - SEVERE (8-10): Excruciating pain, unable to do any normal activities.      Unable to sleep severe pain  4. PATTERN: "Is the pain constant?" (e.g., yes, no; constant, intermittent)      Constant  5. RADIATION: "Does the pain shoot into your legs or somewhere else?"     Upper legs and hips. 6. CAUSE:  "What do you think is causing the back pain?"      Frequent falls last couple of weeks ago and you were in the hospital after  7. BACK OVERUSE:  "Any recent lifting of heavy objects, strenuous work or exercise?"     na 8. MEDICINES: "What have you taken so far for the pain?" (e.g., nothing, acetaminophen, NSAIDS)     Tylenol not effective  9. NEUROLOGIC SYMPTOMS: "Do you  have any weakness, numbness, or problems with bowel/bladder control?"     Tingling in feet  10. OTHER SYMPTOMS: "Do you have any other symptoms?" (e.g., fever, abdomen pain, burning with urination, blood in urine)       Low abdominal area upper legs, thighs and hips , swelling in legs same as baseline  11. PREGNANCY: "Is there any chance you are pregnant?" "When was your last menstrual period?"       na  Protocols used: Back Pain-A-AH

## 2022-04-22 DIAGNOSIS — I2721 Secondary pulmonary arterial hypertension: Secondary | ICD-10-CM | POA: Diagnosis not present

## 2022-04-22 DIAGNOSIS — M797 Fibromyalgia: Secondary | ICD-10-CM | POA: Diagnosis not present

## 2022-04-22 DIAGNOSIS — K219 Gastro-esophageal reflux disease without esophagitis: Secondary | ICD-10-CM | POA: Diagnosis not present

## 2022-04-22 DIAGNOSIS — I700 Atherosclerosis of aorta: Secondary | ICD-10-CM | POA: Diagnosis not present

## 2022-04-22 DIAGNOSIS — I130 Hypertensive heart and chronic kidney disease with heart failure and stage 1 through stage 4 chronic kidney disease, or unspecified chronic kidney disease: Secondary | ICD-10-CM | POA: Diagnosis not present

## 2022-04-22 DIAGNOSIS — D509 Iron deficiency anemia, unspecified: Secondary | ICD-10-CM | POA: Diagnosis not present

## 2022-04-22 DIAGNOSIS — G8929 Other chronic pain: Secondary | ICD-10-CM | POA: Diagnosis not present

## 2022-04-22 DIAGNOSIS — I509 Heart failure, unspecified: Secondary | ICD-10-CM | POA: Diagnosis not present

## 2022-04-22 DIAGNOSIS — J4489 Other specified chronic obstructive pulmonary disease: Secondary | ICD-10-CM | POA: Diagnosis not present

## 2022-04-22 DIAGNOSIS — Z9981 Dependence on supplemental oxygen: Secondary | ICD-10-CM | POA: Diagnosis not present

## 2022-04-22 DIAGNOSIS — M1990 Unspecified osteoarthritis, unspecified site: Secondary | ICD-10-CM | POA: Diagnosis not present

## 2022-04-22 DIAGNOSIS — Z8673 Personal history of transient ischemic attack (TIA), and cerebral infarction without residual deficits: Secondary | ICD-10-CM | POA: Diagnosis not present

## 2022-04-22 DIAGNOSIS — J439 Emphysema, unspecified: Secondary | ICD-10-CM | POA: Diagnosis not present

## 2022-04-22 DIAGNOSIS — E039 Hypothyroidism, unspecified: Secondary | ICD-10-CM | POA: Diagnosis not present

## 2022-04-22 DIAGNOSIS — M5136 Other intervertebral disc degeneration, lumbar region: Secondary | ICD-10-CM | POA: Diagnosis not present

## 2022-04-22 DIAGNOSIS — M47812 Spondylosis without myelopathy or radiculopathy, cervical region: Secondary | ICD-10-CM | POA: Diagnosis not present

## 2022-04-22 DIAGNOSIS — I4891 Unspecified atrial fibrillation: Secondary | ICD-10-CM | POA: Diagnosis not present

## 2022-04-22 DIAGNOSIS — Z87891 Personal history of nicotine dependence: Secondary | ICD-10-CM | POA: Diagnosis not present

## 2022-04-22 DIAGNOSIS — N1831 Chronic kidney disease, stage 3a: Secondary | ICD-10-CM | POA: Diagnosis not present

## 2022-04-22 DIAGNOSIS — E7800 Pure hypercholesterolemia, unspecified: Secondary | ICD-10-CM | POA: Diagnosis not present

## 2022-04-22 DIAGNOSIS — I083 Combined rheumatic disorders of mitral, aortic and tricuspid valves: Secondary | ICD-10-CM | POA: Diagnosis not present

## 2022-04-22 DIAGNOSIS — Z7982 Long term (current) use of aspirin: Secondary | ICD-10-CM | POA: Diagnosis not present

## 2022-04-22 DIAGNOSIS — K589 Irritable bowel syndrome without diarrhea: Secondary | ICD-10-CM | POA: Diagnosis not present

## 2022-04-22 NOTE — Telephone Encounter (Signed)
Please let her know I cannot manage her chronic back pain with hydrocodone. It was only ordered as a short term medication for acute kidney infection recently.  She has previously been seen by [facility] Pain Management.  Nobie Putnam, [facility] 04/22/2022, 5:21 PM

## 2022-04-23 ENCOUNTER — Ambulatory Visit: Payer: Self-pay | Admitting: *Deleted

## 2022-04-23 NOTE — Telephone Encounter (Signed)
  Chief Complaint: back pain right side pain  Symptoms: constant back pain and right side pain , pain with urination. Frequency reported due to medication per patient.denies dark color .  Frequency: on going Pertinent Negatives: Patient denies fever, no urinary issues like when having UTI sx.  Disposition: [] ED /[] Urgent Care (no appt availability in office) / [x] Appointment(In office/virtual)/ []  [place] [facility]/ [] Home Care/ [] Refused Recommended Disposition /[] [place] [facility]/ []  Follow-up with PCP Additional Notes:   Reviewed message from Dr. Raliegh Ip from  04/22/22 that "I can not manage her chronic back pain with hydrocodone, it was only ordered as a short term medication for acute kidney infection recently. She has previously been seen by [facility] pain management.  Appt scheduled for 04/24/22 with PCP to discuss sx and treatment. Recommended if sx worsen go to UC/ED .    Reason for Disposition  Side (flank) or lower back pain present  Answer Assessment - Initial Assessment Questions 1. SEVERITY: "How bad is the pain?"  (e.g., Scale 1-10; mild, moderate, or severe)   - MILD (1-3): complains slightly about urination hurting   - MODERATE (4-7): interferes with normal activities     - SEVERE (8-10): excruciating, unwilling or unable to urinate because of the pain      Severe pain right side and constant  2. FREQUENCY: "How many times have you had painful urination today?"      Urinates frequently due to medication per patient 3. PATTERN: "Is pain present every time you urinate or just sometimes?"      na 4. ONSET: "When did the painful urination start?"      na 5. FEVER: "Do you have a fever?" If Yes, ask: "What is your temperature, how was it measured, and when did it start?"     na 6. PAST UTI: "Have you had a urine infection before?" If Yes, ask: "When was the last time?" and "What happened that time?"      Yes  this is different possible back pain  7. CAUSE: "What do you  think is causing the painful urination?"  (e.g., UTI, scratch, Herpes sore)     na 8. OTHER SYMPTOMS: "Do you have any other symptoms?" (e.g., blood in urine, flank pain, genital sores, urgency, vaginal discharge)     Tingling in feet, pain with urination constant right side pain throbbing  9. PREGNANCY: "Is there any chance you are pregnant?" "When was your last menstrual period?"     na  Protocols used: Urination Pain - Female-A-AH

## 2022-04-24 ENCOUNTER — Ambulatory Visit (INDEPENDENT_AMBULATORY_CARE_PROVIDER_SITE_OTHER): Payer: 59 | Admitting: Family Medicine

## 2022-04-24 ENCOUNTER — Encounter: Payer: Self-pay | Admitting: Family Medicine

## 2022-04-24 VITALS — BP 130/78 | HR 67 | Ht 63.0 in | Wt 148.6 lb

## 2022-04-24 DIAGNOSIS — K589 Irritable bowel syndrome without diarrhea: Secondary | ICD-10-CM | POA: Diagnosis not present

## 2022-04-24 DIAGNOSIS — M1990 Unspecified osteoarthritis, unspecified site: Secondary | ICD-10-CM | POA: Diagnosis not present

## 2022-04-24 DIAGNOSIS — M797 Fibromyalgia: Secondary | ICD-10-CM | POA: Diagnosis not present

## 2022-04-24 DIAGNOSIS — J4489 Other specified chronic obstructive pulmonary disease: Secondary | ICD-10-CM | POA: Diagnosis not present

## 2022-04-24 DIAGNOSIS — M5136 Other intervertebral disc degeneration, lumbar region: Secondary | ICD-10-CM | POA: Diagnosis not present

## 2022-04-24 DIAGNOSIS — E7800 Pure hypercholesterolemia, unspecified: Secondary | ICD-10-CM | POA: Diagnosis not present

## 2022-04-24 DIAGNOSIS — M5441 Lumbago with sciatica, right side: Secondary | ICD-10-CM

## 2022-04-24 DIAGNOSIS — Z8673 Personal history of transient ischemic attack (TIA), and cerebral infarction without residual deficits: Secondary | ICD-10-CM | POA: Diagnosis not present

## 2022-04-24 DIAGNOSIS — N1831 Chronic kidney disease, stage 3a: Secondary | ICD-10-CM | POA: Diagnosis not present

## 2022-04-24 DIAGNOSIS — G8929 Other chronic pain: Secondary | ICD-10-CM | POA: Diagnosis not present

## 2022-04-24 DIAGNOSIS — D509 Iron deficiency anemia, unspecified: Secondary | ICD-10-CM | POA: Diagnosis not present

## 2022-04-24 DIAGNOSIS — E039 Hypothyroidism, unspecified: Secondary | ICD-10-CM | POA: Diagnosis not present

## 2022-04-24 DIAGNOSIS — Z87891 Personal history of nicotine dependence: Secondary | ICD-10-CM | POA: Diagnosis not present

## 2022-04-24 DIAGNOSIS — M47812 Spondylosis without myelopathy or radiculopathy, cervical region: Secondary | ICD-10-CM | POA: Diagnosis not present

## 2022-04-24 DIAGNOSIS — I130 Hypertensive heart and chronic kidney disease with heart failure and stage 1 through stage 4 chronic kidney disease, or unspecified chronic kidney disease: Secondary | ICD-10-CM | POA: Diagnosis not present

## 2022-04-24 DIAGNOSIS — Z7982 Long term (current) use of aspirin: Secondary | ICD-10-CM | POA: Diagnosis not present

## 2022-04-24 DIAGNOSIS — I509 Heart failure, unspecified: Secondary | ICD-10-CM | POA: Diagnosis not present

## 2022-04-24 DIAGNOSIS — I4891 Unspecified atrial fibrillation: Secondary | ICD-10-CM | POA: Diagnosis not present

## 2022-04-24 DIAGNOSIS — Z9981 Dependence on supplemental oxygen: Secondary | ICD-10-CM | POA: Diagnosis not present

## 2022-04-24 DIAGNOSIS — I2721 Secondary pulmonary arterial hypertension: Secondary | ICD-10-CM | POA: Diagnosis not present

## 2022-04-24 DIAGNOSIS — I083 Combined rheumatic disorders of mitral, aortic and tricuspid valves: Secondary | ICD-10-CM | POA: Diagnosis not present

## 2022-04-24 DIAGNOSIS — I700 Atherosclerosis of aorta: Secondary | ICD-10-CM | POA: Diagnosis not present

## 2022-04-24 DIAGNOSIS — K219 Gastro-esophageal reflux disease without esophagitis: Secondary | ICD-10-CM | POA: Diagnosis not present

## 2022-04-24 DIAGNOSIS — J439 Emphysema, unspecified: Secondary | ICD-10-CM | POA: Diagnosis not present

## 2022-04-24 MED ORDER — GABAPENTIN 600 MG PO TABS: 600.0000 mg | ORAL_TABLET | Freq: Three times a day (TID) | ORAL | 1 refills | Status: DC

## 2022-04-24 MED ORDER — METHOCARBAMOL 500 MG PO TABS: 500.0000 mg | ORAL_TABLET | Freq: Three times a day (TID) | ORAL | 1 refills | Status: DC | PRN

## 2022-04-24 NOTE — Progress Notes (Signed)
Subjective:    Patient ID: Beth [name], female    DOB: May 09, 1945, 77 y.o.   MRN: [id number]  Beth [name] is a 77 y.o. female presenting on 04/24/2022 for Back Pain   HPI  Right sided Sciatica Lumbar DDD  Last treated Feb 2024 for acute low back pain sciatica, treated with Prednisone taper with limited relief also treated on 04/05/22 for pyelonephritis and given pain medication  She has previously followed with Dr Holley [facility] Pain Management, last visit 6-9 months ago previously, not following recently. They were not managing with medication however. Continues to take Gabapentin 300mg  3 times a day, has not tried taking more, not sleepy or groggy on this Tried Tizanidine previously On Baclofen but not helping, taking AS NEEDED  She describes persistent R LBP with radiating sciatica pain. She is ambulatory without assistance. Admits pain limits her function  Note she has upcoming visit with Psychiatry on 05/13/22.     04/24/2022    9:58 AM 04/05/2022   10:49 AM 03/15/2022    3:21 PM  Depression screen PHQ 2/9  Decreased Interest 1 1 1   Down, Depressed, Hopeless 1 1 1   PHQ - 2 Score 2 2 2   Altered sleeping 0 0 0  Tired, decreased energy 0 2 2  Change in appetite 0 0 0  Feeling bad or failure about yourself  2 3 3   Trouble concentrating 0 1 1  Moving slowly or fidgety/restless 0 0 0  Suicidal thoughts 0 0 0  PHQ-9 Score 4 8 8   Difficult doing work/chores Somewhat difficult Somewhat difficult Not difficult at all    Social History   Tobacco Use   Smoking status: Former    Packs/day: 0.25    Years: 57.00    Additional pack years: 0.00    Total pack years: 14.25    Types: Cigarettes    Quit date: 10/24/2020    Years since quitting: 1.4   Smokeless tobacco: Never   Tobacco comments:    6-8cig daily--01/01/2022  Vaping Use   Vaping Use: Never used  Substance Use Topics   Alcohol use: No    Alcohol/week: 0.0 standard drinks of alcohol   Drug use: No    Review  of Systems Per HPI unless specifically indicated above     Objective:    BP 130/78   Pulse 67   Ht 5\' 3"  (1.6 m)   Wt 148 lb 9.6 oz (67.4 kg)   SpO2 98%   BMI 26.32 kg/m   Wt Readings from Last 3 Encounters:  04/24/22 148 lb 9.6 oz (67.4 kg)  04/05/22 147 lb 12.8 oz (67 kg)  03/24/22 145 lb 1 oz (65.8 kg)    Physical Exam Vitals and nursing note reviewed.  Constitutional:      General: She is not in acute distress.    Appearance: She is well-developed. She is not diaphoretic.     Comments: Well-appearing, comfortable, cooperative  HENT:     Head: Normocephalic and atraumatic.  Eyes:     General:        Right eye: No discharge.        Left eye: No discharge.     Conjunctiva/sclera: Conjunctivae normal.  Neck:     Thyroid: No thyromegaly.  Cardiovascular:     Rate and Rhythm: Normal rate and regular rhythm.     Heart sounds: Normal heart sounds. No murmur heard. Pulmonary:     Effort: Pulmonary effort is normal. No respiratory  distress.     Breath sounds: Normal breath sounds. No wheezing or rales.  Musculoskeletal:     Cervical back: Normal range of motion and neck supple.     Comments: Localized R low back with pain. Provoked with position change and some radiating pain into R lower extremity. Able to transfer and ambulate without assistance. Has cane  Lymphadenopathy:     Cervical: No cervical adenopathy.  Skin:    General: Skin is warm and dry.     Findings: No erythema or rash.  Neurological:     Mental Status: She is alert and oriented to person, place, and time.  Psychiatric:        Behavior: Behavior normal.     Comments: Well groomed, good eye contact, normal speech and thoughts       Results for orders placed or performed in visit on 04/05/22  POCT Urinalysis Dipstick  Result Value Ref Range   Color, UA Orange    Clarity, UA Cloudy    Glucose, UA Negative Negative   Bilirubin, UA Negative    Ketones, UA Negative    Spec Grav, UA 1.015 1.010 -  1.025   Blood, UA Trace (A)    pH, UA 5.0 5.0 - 8.0   Protein, UA Negative Negative   Urobilinogen, UA 0.2 0.2 or 1.0 E.U./dL   Nitrite, UA Negative    Leukocytes, UA Large (3+) (A) Negative   Appearance     Odor        Assessment & Plan:   Problem List Items Addressed This Visit   None Visit Diagnoses     Chronic right-sided low back pain with right-sided sciatica    -  Primary   Relevant Medications   gabapentin (NEURONTIN) 600 MG tablet   methocarbamol (ROBAXIN) 500 MG tablet       RLBP Lumbar DDD / Sciatica Chronic pain, seems as primary source of pain right now. No new acute injury. Pyelo vs UTI has resolved.  Discussed that she previously was with Pain Management. She should return for further management of her chronic pain.  As discussed, I would be only able to do short term situational Hydrocodone pain medication as we recently treated fall injury or pyelonephritis, I cannot continue it long term.  Suggestions given today  Stop Baclofen Switch to Methocarbamol 500mg  take one up to 3 times a day for pain relief  Increase Gabapentin, previously on 300mg  3 times a day I have ordered the Gabapentin 600mg , now, you can go to gradually increase this, up to max of 600mg  3 times a day - Start with 300mg  in morning, 600mg  in afternoon, and 300mg  at night - After a few days if tolerated well and not too sleepy groggy, can keep increasing up to max dose.  Also discussed with upcoming Psychiatry 4/15 they will assist with med management. Caution with polypharmacy and addition of pain medication due to side effects potentially  Meds ordered this encounter  Medications   gabapentin (NEURONTIN) 600 MG tablet    Sig: Take 1 tablet (600 mg total) by mouth 3 (three) times daily.    Dispense:  90 tablet    Refill:  1   methocarbamol (ROBAXIN) 500 MG tablet    Sig: Take 1 tablet (500 mg total) by mouth every 8 (eight) hours as needed for muscle spasms.    Dispense:  90 tablet     Refill:  1      Follow up plan: Return if symptoms  worsen or fail to improve.   Nobie Putnam, [facility] 04/24/2022, 10:09 AM

## 2022-04-24 NOTE — Patient Instructions (Addendum)
Thank you for coming to the office today.  Stop Baclofen Switch to Methocarbamol 500mg  take one up to 3 times a day for pain relief  Increase Gabapentin, previously on 300mg  3 times a day I have ordered the Gabapentin 600mg , now, you can go to gradually increase this, up to max of 600mg  3 times a day - Start with 300mg  in morning, 600mg  in afternoon, and 300mg  at night - After a few days if tolerated well and not too sleepy groggy, can keep increasing up to max dose.  Please try to return to Pain Management at [facility] or elsewhere  As discussed, I would be only able to do short term Hydrocodone pain medication, I cannot continue it long term.   Please schedule a Follow-up Appointment to: Return if symptoms worsen or fail to improve.  If you have any other questions or concerns, please feel free to call the office or send a message through [place]. You may also schedule an earlier appointment if necessary.  Additionally, you may be receiving a survey about your experience at our office within a few days to 1 week by e-mail or mail. We value your feedback.  Nobie Putnam, DO [place]

## 2022-04-27 DIAGNOSIS — J441 Chronic obstructive pulmonary disease with (acute) exacerbation: Secondary | ICD-10-CM | POA: Diagnosis not present

## 2022-04-28 DIAGNOSIS — F332 Major depressive disorder, recurrent severe without psychotic features: Secondary | ICD-10-CM

## 2022-04-28 DIAGNOSIS — I5032 Chronic diastolic (congestive) heart failure: Secondary | ICD-10-CM

## 2022-05-08 ENCOUNTER — Other Ambulatory Visit: Payer: Self-pay | Admitting: Family Medicine

## 2022-05-08 ENCOUNTER — Other Ambulatory Visit: Payer: Self-pay | Admitting: Pulmonary Disease

## 2022-05-08 DIAGNOSIS — F332 Major depressive disorder, recurrent severe without psychotic features: Secondary | ICD-10-CM

## 2022-05-09 NOTE — Telephone Encounter (Signed)
Requested Prescriptions  Pending Prescriptions Disp Refills   FLUoxetine (PROZAC) 20 MG capsule [Pharmacy Med Name: FLUOXETINE 20MG  CAPSULES] 270 capsule 1    Sig: TAKE 3 CAPSULES(60 MG) BY MOUTH DAILY     Psychiatry:  Antidepressants - SSRI Passed - 05/08/2022 10:03 AM      Passed - Completed PHQ-2 or PHQ-9 in the last 360 days      Passed - Valid encounter within last 6 months    Recent Outpatient Visits           2 weeks ago Chronic right-sided low back pain with right-sided sciatica   [place] [facility] Smitty Cords, DO   1 month ago Pyelonephritis   [place] [facility] Smitty Cords, DO   1 month ago Chronic right-sided low back pain with right-sided sciatica   [place] [facility] Smitty Cords, DO   3 months ago Acute bronchitis with COPD [facility])   [place] [facility] Smitty Cords, DO   6 months ago COPD, severe [facility])   [place] [facility] Delles, Gentry Fitz A, RPH-CPP

## 2022-05-10 ENCOUNTER — Ambulatory Visit (INDEPENDENT_AMBULATORY_CARE_PROVIDER_SITE_OTHER): Payer: 59 | Admitting: Pharmacist

## 2022-05-10 DIAGNOSIS — M47812 Spondylosis without myelopathy or radiculopathy, cervical region: Secondary | ICD-10-CM | POA: Diagnosis not present

## 2022-05-10 DIAGNOSIS — K219 Gastro-esophageal reflux disease without esophagitis: Secondary | ICD-10-CM | POA: Diagnosis not present

## 2022-05-10 DIAGNOSIS — Z87891 Personal history of nicotine dependence: Secondary | ICD-10-CM | POA: Diagnosis not present

## 2022-05-10 DIAGNOSIS — I4891 Unspecified atrial fibrillation: Secondary | ICD-10-CM | POA: Diagnosis not present

## 2022-05-10 DIAGNOSIS — I700 Atherosclerosis of aorta: Secondary | ICD-10-CM | POA: Diagnosis not present

## 2022-05-10 DIAGNOSIS — M1990 Unspecified osteoarthritis, unspecified site: Secondary | ICD-10-CM | POA: Diagnosis not present

## 2022-05-10 DIAGNOSIS — E039 Hypothyroidism, unspecified: Secondary | ICD-10-CM | POA: Diagnosis not present

## 2022-05-10 DIAGNOSIS — G8929 Other chronic pain: Secondary | ICD-10-CM | POA: Diagnosis not present

## 2022-05-10 DIAGNOSIS — I10 Essential (primary) hypertension: Secondary | ICD-10-CM

## 2022-05-10 DIAGNOSIS — I130 Hypertensive heart and chronic kidney disease with heart failure and stage 1 through stage 4 chronic kidney disease, or unspecified chronic kidney disease: Secondary | ICD-10-CM | POA: Diagnosis not present

## 2022-05-10 DIAGNOSIS — I2721 Secondary pulmonary arterial hypertension: Secondary | ICD-10-CM | POA: Diagnosis not present

## 2022-05-10 DIAGNOSIS — M5136 Other intervertebral disc degeneration, lumbar region: Secondary | ICD-10-CM | POA: Diagnosis not present

## 2022-05-10 DIAGNOSIS — Z9981 Dependence on supplemental oxygen: Secondary | ICD-10-CM | POA: Diagnosis not present

## 2022-05-10 DIAGNOSIS — N1831 Chronic kidney disease, stage 3a: Secondary | ICD-10-CM | POA: Diagnosis not present

## 2022-05-10 DIAGNOSIS — I509 Heart failure, unspecified: Secondary | ICD-10-CM | POA: Diagnosis not present

## 2022-05-10 DIAGNOSIS — M797 Fibromyalgia: Secondary | ICD-10-CM | POA: Diagnosis not present

## 2022-05-10 DIAGNOSIS — J439 Emphysema, unspecified: Secondary | ICD-10-CM | POA: Diagnosis not present

## 2022-05-10 DIAGNOSIS — Z8673 Personal history of transient ischemic attack (TIA), and cerebral infarction without residual deficits: Secondary | ICD-10-CM | POA: Diagnosis not present

## 2022-05-10 DIAGNOSIS — D509 Iron deficiency anemia, unspecified: Secondary | ICD-10-CM | POA: Diagnosis not present

## 2022-05-10 DIAGNOSIS — I083 Combined rheumatic disorders of mitral, aortic and tricuspid valves: Secondary | ICD-10-CM | POA: Diagnosis not present

## 2022-05-10 DIAGNOSIS — J432 Centrilobular emphysema: Secondary | ICD-10-CM

## 2022-05-10 DIAGNOSIS — E7800 Pure hypercholesterolemia, unspecified: Secondary | ICD-10-CM | POA: Diagnosis not present

## 2022-05-10 DIAGNOSIS — Z7982 Long term (current) use of aspirin: Secondary | ICD-10-CM | POA: Diagnosis not present

## 2022-05-10 DIAGNOSIS — J4489 Other specified chronic obstructive pulmonary disease: Secondary | ICD-10-CM | POA: Diagnosis not present

## 2022-05-10 DIAGNOSIS — K589 Irritable bowel syndrome without diarrhea: Secondary | ICD-10-CM | POA: Diagnosis not present

## 2022-05-10 NOTE — Patient Instructions (Signed)
Visit Information  Thank you for taking time to visit with me today. Please don't hesitate to contact me if I can be of assistance to you before our next scheduled telephone appointment.  Following are the goals we discussed today:   Goals Addressed             This Visit's Progress    Pharmacy Goals       Our goal bad cholesterol, or LDL, is less than 70. This is why it is important to continue taking your atorvastatin  Please check your home blood pressure and weights, keep a log of the results and bring this with you to your medical appointments.  Feel free to call me with any questions or concerns. I look forward to our next call!  Estelle Grumbles, PharmD, BCACP Clinical Pharmacist [facility] [phone]         Our next appointment is by telephone on 7/15 at 10 am  Please call the care guide team at [phone] if you need to cancel or reschedule your appointment.    The patient verbalized understanding of instructions, educational materials, and care plan provided today and DECLINED offer to receive copy of patient instructions, educational materials, and care plan.
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
## 2022-05-10 NOTE — Chronic Care Management (AMB) (Signed)
Chronic Care Management CCM Pharmacy Note  05/10/2022 Name:  Beth [name] MRN:  [id number] DOB:  Jan 31, 1945  Subjective: Beth [name] is an 77 y.o. year old female who is a primary patient of Smitty Cords, DO.  The CCM team was consulted for assistance with disease management and care coordination needs.    Engaged with patient by telephone for follow up visit for pharmacy case management and/or care coordination services.   Objective:  Medications Reviewed Today     Reviewed by Manuela Neptune, RPH-CPP (Pharmacist) on 05/10/22 at 1130  Med List Status: <None>   Medication Order Taking? Sig Documenting Provider Last Dose Status Informant  acetaminophen (TYLENOL) 500 MG tablet [id number]  Take 500 mg by mouth 2 (two) times daily as needed. [provider]  Active   albuterol (VENTOLIN HFA) 108 (90 Base) MCG/ACT inhaler [id number]  Inhale 1-2 puffs into the lungs every 6 (six) hours as needed for wheezing or shortness of breath. Smitty Cords, DO  Active   aspirin 81 MG chewable tablet [id number]  Chew 81 mg by mouth daily. [provider]  Active Pharmacy Records           Med Note Simonne Come Dec 18, 2016  4:14 PM)    atorvastatin (LIPITOR) 40 MG tablet [id number]  TAKE 1 TABLET BY MOUTH EVERY DAY Smitty Cords, DO  Active            Med Note Sherryl Manges Mar 24, 2022  4:14 AM) Last filled 12/26/21 90 day supply  FLUoxetine (PROZAC) 20 MG capsule [id number] Yes TAKE 3 CAPSULES(60 MG) BY MOUTH DAILY Smitty Cords, DO Taking Active   furosemide (LASIX) 20 MG tablet [id number]  Take 2 tablets (40 mg total) by mouth daily. If swelling is less, can take only 1 pill. Smitty Cords, DO  Active            Med Note Michaelle Copas, TIFFANY N   Sun Mar 24, 2022  4:16 AM) Last filled 12/26/21 180 tabs as a 90 day supply  gabapentin (NEURONTIN) 600 MG tablet [id number] Yes Take 1 tablet (600 mg total) by  mouth 3 (three) times daily. Smitty Cords, DO Taking Active   LORazepam (ATIVAN) 0.5 MG tablet [id number] Yes Take 1 tablet (0.5 mg total) by mouth every other day. Neysa Hotter, MD Taking Active            Med Note Sherryl Manges Mar 24, 2022  4:18 AM) Last filled 02/27/22 30 day supply  Melatonin 3 MG CAPS [id number]  Take 10 mg by mouth at bedtime as needed (sleep).  [provider]  Active Self  methocarbamol (ROBAXIN) 500 MG tablet [id number] Yes Take 1 tablet (500 mg total) by mouth every 8 (eight) hours as needed for muscle spasms. Smitty Cords, DO Taking Active   metoprolol succinate (TOPROL-XL) 25 MG 24 hr tablet [id number]  TAKE 1/2 TABLET(12.5 MG) BY MOUTH DAILY Smitty Cords, DO  Active            Med Note Michaelle Copas, Lennox Laity Mar 24, 2022  4:18 AM) Last filled 02/18/22 90 day supply  montelukast (SINGULAIR) 10 MG tablet [id number]  TAKE 1 TABLET(10 MG) BY MOUTH AT BEDTIME Smitty Cords, DO  Active            Med Note Michaelle Copas, [place] Dorris Carnes   Sun Mar 24, 2022  4:19 AM) Last filled 03/13/22 90 day supply  ondansetron (ZOFRAN-ODT) 4 MG disintegrating tablet [id number]  Take 1 tablet (4 mg total) by mouth every 8 (eight) hours as needed for nausea or vomiting. Karamalegos, Netta Neat, DO  Active   potassium chloride (KLOR-CON) 10 MEQ tablet [id number]  Take 1 tablet (10 mEq total) by mouth daily. Smitty Cords, DO  Active Self           Med Note Sherryl Manges Mar 24, 2022  4:19 AM) Last filled 02/12/22 90 day supply  QUEtiapine (SEROQUEL) 200 MG tablet [id number] Yes Take 1 tablet (200 mg total) by mouth at bedtime. Take total of 250 mg. Take along with 50 mg tab Neysa Hotter, MD Taking Active            Med Note Sherryl Manges Mar 24, 2022  4:22 AM) Last filled 12/26/21 90 day supply  QUEtiapine (SEROQUEL) 50 MG tablet [id number] Yes Take 1 tablet (50 mg total) by mouth at bedtime. Total of 250 mg  at night. Take along with 200 mg tab Neysa Hotter, MD Taking Active            Med Note Sherryl Manges Mar 24, 2022  4:23 AM) Last filled 03/06/22 30 day supply  TRELEGY ELLIPTA 200-62.5-25 MCG/ACT AEPB [id number]  INHALE 1 PUFF INTO THE LUNGS DAILY Salena Saner, MD  Active   Med List Note Nonah Mattes, RN 02/12/18 1055): Medication agreement signed 02/12/2018 UDS 02/24/17 03/25/17 PA requested for gabapentin sent SB             Pertinent Labs:   Lab Results  Component Value Date   CHOL 133 03/24/2022   HDL 52 03/24/2022   LDLCALC 73 03/24/2022   TRIG 40 03/24/2022   CHOLHDL 2.6 03/24/2022   Lab Results  Component Value Date   CREATININE 1.11 (H) 03/24/2022   BUN 23 03/24/2022   NA 139 03/24/2022   K 3.9 03/24/2022   CL 104 03/24/2022   CO2 28 03/24/2022   BP Readings from Last 3 Encounters:  04/24/22 130/78  04/05/22 122/68  03/25/22 (!) 118/50   Pulse Readings from Last 3 Encounters:  04/24/22 67  04/05/22 70  03/25/22 61     SDOH:  (Social Determinants of Health) assessments and interventions performed:  SDOH Interventions    Flowsheet Row Office Visit from 04/24/2022 in [facility] Office Visit from 04/05/2022 in [facility] Office Visit from 03/15/2022 in [facility] Office Visit from 03/07/2022 in confidential department Chronic Care Management from 02/01/2022 in [facility] Counselor from 01/16/2022 in confidential department  SDOH Interventions        Food Insecurity Interventions -- -- -- -- Intervention Not Indicated --  Housing Interventions -- -- -- -- Intervention Not Indicated --  Transportation Interventions -- -- -- -- Intervention Not Indicated --  Utilities Interventions -- -- -- -- Intervention Not Indicated --  Alcohol Usage Interventions -- -- -- -- Intervention Not Indicated (Score <7) --  Depression  Interventions/Treatment  Counseling, Medication Counseling, Medication Medication Medication -- Counseling  Financial Strain Interventions -- -- -- -- Intervention Not Indicated --  Physical Activity Interventions -- -- -- -- Intervention Not Indicated --  Stress Interventions -- -- -- -- Intervention Not Indicated --  Social Connections Interventions -- -- -- --  Intervention Not Indicated --       CCM Care Plan  Review of patient past medical history, allergies, medications, health status, including review of consultants reports, laboratory and other test data, was performed as part of comprehensive evaluation and provision of chronic care management services.   Care Plan : PharmD - Med Mgmt  Updates made by Manuela Neptune, RPH-CPP since 05/10/2022 12:00 AM     Problem: Disease Progression      Long-Range Goal: Disease Progression Prevented or Minimized   Start Date: 04/10/2020  Expected End Date: 07/09/2020  Recent Progress: On track  Priority: High  Note:   Current Barriers:  Chronic Disease Management support, education, and care coordination needs related to previous CVA, COPD, hypertension, hyperlipidemia, depression  Pharmacist Clinical Goal(s):  Over the next 90 days, patient will achieve adherence to monitoring guidelines and medication adherence to achieve therapeutic efficacy through collaboration with PharmD and provider.   Interventions: 1:1 collaboration with Smitty Cords, DO regarding development and update of comprehensive plan of care as evidenced by provider attestation and co-signature Inter-disciplinary care team collaboration (see longitudinal plan of care) Perform chart review. Patient admitted to [facility] 2/24-2/26/2024 related to acute left-sided weakness. Provider advised: Nursing home placement recommended, but patient declined. Plan to setup home care Office Visit with PCP related to back pain on 3/27. Provider advised patient: Stop baclofen.  Switch to methocarbamol  take one up to 3 times a day for pain relief  Increase Gabapentin gradually up to max of  3 times a day if tolerated well Return to pain management Keep follow up appointment with psychiatrist on 4/15 Review counseling for caution with taking medications that can cause sedation/dizziness, including methocarbamol, gabapentin, lorazepam and quetiapine, particularly when taken in combination Patient denies recent trouble with dizziness or daytime sedation Have encouraged patient to use walker device consistently to aid with fall prevention Note patient currently being seen by [facility] Reports following up with Dr. Welton Flakes (pain management specialist) to request a new appointment  Moderate to Severe COPD:  Patient followed by [facility] in [place] Current treatment: Trelegy 1 puff daily , rinse after use Patient confirms rinsing after use.  Albuterol inhaler as needed Overnight oxygen as directed by Pulmonology Have reviewed with patient the importance of consistently using overnight oxygen as directed  Hypertension Reports taking: Furosemide 20 mg - 1 tablet daily (Note uses up to 2 tablets daily when needed for swelling per direction from PCP) Potassium chloride 10 mEq daily Metoprolol ER 25 mg - 1/2 tablet (12.5 mg ) daily Recalls home BP last checked [facility] health RN, reading ~120/63 Encourage patient to monitor home blood pressure, and keep log of BP and bring record to medical appointments Encourage patient to continue monitoring home weight and bring this record to medical appointments Have reminded patient to follow up with provider if has a weight change greater than established parameters   Patient Goals/Self-Care Activities Over the next 90 days, patient will:  To self administers medications as prescribed Encourage patient to obtain weekly pillbox to use as adherence tool Attends all scheduled provider  appointments Calls pharmacy for medication refills Calls provider office for new concerns or questions  Follow Up Plan: CM Pharmacist will outreach to patient by telephone next on 7/15 at 10 am       Estelle Grumbles, PharmD, [place], CPP Clinical Pharmacist [facility] [phone]

## 2022-05-12 ENCOUNTER — Other Ambulatory Visit: Payer: Self-pay | Admitting: Family Medicine

## 2022-05-12 DIAGNOSIS — I5032 Chronic diastolic (congestive) heart failure: Secondary | ICD-10-CM

## 2022-05-13 ENCOUNTER — Ambulatory Visit (INDEPENDENT_AMBULATORY_CARE_PROVIDER_SITE_OTHER): Payer: 59 | Admitting: Psychiatry

## 2022-05-13 ENCOUNTER — Encounter: Payer: Self-pay | Admitting: Psychiatry

## 2022-05-13 VITALS — BP 116/67 | HR 77 | Temp 97.2°F | Ht 63.0 in | Wt 150.8 lb

## 2022-05-13 DIAGNOSIS — F4310 Post-traumatic stress disorder, unspecified: Secondary | ICD-10-CM | POA: Diagnosis not present

## 2022-05-13 DIAGNOSIS — F1320 Sedative, hypnotic or anxiolytic dependence, uncomplicated: Secondary | ICD-10-CM

## 2022-05-13 DIAGNOSIS — F330 Major depressive disorder, recurrent, mild: Secondary | ICD-10-CM | POA: Diagnosis not present

## 2022-05-13 DIAGNOSIS — F411 Generalized anxiety disorder: Secondary | ICD-10-CM

## 2022-05-13 MED ORDER — QUETIAPINE FUMARATE 50 MG PO TABS: 50.0000 mg | ORAL_TABLET | Freq: Every day | ORAL | 0 refills | Status: DC

## 2022-05-13 MED ORDER — QUETIAPINE FUMARATE 200 MG PO TABS: 200.0000 mg | ORAL_TABLET | Freq: Every day | ORAL | 0 refills | Status: DC

## 2022-05-13 MED ORDER — LORAZEPAM 0.5 MG PO TABS: 0.5000 mg | ORAL_TABLET | ORAL | 1 refills | Status: AC

## 2022-05-13 NOTE — Telephone Encounter (Signed)
Requested Prescriptions  Pending Prescriptions Disp Refills   potassium chloride (KLOR-CON) 10 MEQ tablet [Pharmacy Med Name: POTASSIUM CL ER TABLETS] 90 tablet 2    Sig: TAKE 1 TABLET(10 MEQ) BY MOUTH DAILY     Endocrinology:  Minerals - Potassium Supplementation Failed - 05/12/2022 11:02 AM      Failed - Cr in normal range and within 360 days    Creat  Date Value Ref Range Status  09/21/2021 0.99 0.60 - 1.00 mg/dL Final   Creatinine, Ser  Date Value Ref Range Status  03/24/2022 1.11 (H) 0.44 - 1.00 mg/dL Final         Passed - K in normal range and within 360 days    Potassium  Date Value Ref Range Status  03/24/2022 3.9 3.5 - 5.1 mmol/L Final  12/26/2012 3.3 (L) 3.5 - 5.1 mmol/L Final         Passed - Valid encounter within last 12 months    Recent Outpatient Visits           2 weeks ago Chronic right-sided low back pain with right-sided sciatica   [facility] Smitty Cords, DO   1 month ago Pyelonephritis   [place] [facility] Smitty Cords, DO   1 month ago Chronic right-sided low back pain with right-sided sciatica   [place] [facility] Smitty Cords, DO   3 months ago Acute bronchitis with COPD [facility])   [place] [facility] Smitty Cords, DO   6 months ago COPD, severe [facility])   [facility] Delles, Gentry Fitz A, RPH-CPP

## 2022-05-13 NOTE — Patient Instructions (Addendum)
Continue fluoxetine 60 mg daily Continue quetiapine 250 mg at night Continue lorazepam 0.5 mg every other day as needed for extreme anxiety  Next appointment- 6/13 at 1 pm

## 2022-05-14 ENCOUNTER — Ambulatory Visit: Payer: Self-pay | Admitting: *Deleted

## 2022-05-14 DIAGNOSIS — I083 Combined rheumatic disorders of mitral, aortic and tricuspid valves: Secondary | ICD-10-CM | POA: Diagnosis not present

## 2022-05-14 DIAGNOSIS — K589 Irritable bowel syndrome without diarrhea: Secondary | ICD-10-CM | POA: Diagnosis not present

## 2022-05-14 DIAGNOSIS — K219 Gastro-esophageal reflux disease without esophagitis: Secondary | ICD-10-CM | POA: Diagnosis not present

## 2022-05-14 DIAGNOSIS — M47812 Spondylosis without myelopathy or radiculopathy, cervical region: Secondary | ICD-10-CM | POA: Diagnosis not present

## 2022-05-14 DIAGNOSIS — J4489 Other specified chronic obstructive pulmonary disease: Secondary | ICD-10-CM | POA: Diagnosis not present

## 2022-05-14 DIAGNOSIS — I509 Heart failure, unspecified: Secondary | ICD-10-CM | POA: Diagnosis not present

## 2022-05-14 DIAGNOSIS — Z9981 Dependence on supplemental oxygen: Secondary | ICD-10-CM | POA: Diagnosis not present

## 2022-05-14 DIAGNOSIS — N1831 Chronic kidney disease, stage 3a: Secondary | ICD-10-CM | POA: Diagnosis not present

## 2022-05-14 DIAGNOSIS — I4891 Unspecified atrial fibrillation: Secondary | ICD-10-CM | POA: Diagnosis not present

## 2022-05-14 DIAGNOSIS — Z7982 Long term (current) use of aspirin: Secondary | ICD-10-CM | POA: Diagnosis not present

## 2022-05-14 DIAGNOSIS — E7800 Pure hypercholesterolemia, unspecified: Secondary | ICD-10-CM | POA: Diagnosis not present

## 2022-05-14 DIAGNOSIS — D509 Iron deficiency anemia, unspecified: Secondary | ICD-10-CM | POA: Diagnosis not present

## 2022-05-14 DIAGNOSIS — B379 Candidiasis, unspecified: Secondary | ICD-10-CM

## 2022-05-14 DIAGNOSIS — I700 Atherosclerosis of aorta: Secondary | ICD-10-CM | POA: Diagnosis not present

## 2022-05-14 DIAGNOSIS — Z8673 Personal history of transient ischemic attack (TIA), and cerebral infarction without residual deficits: Secondary | ICD-10-CM | POA: Diagnosis not present

## 2022-05-14 DIAGNOSIS — E039 Hypothyroidism, unspecified: Secondary | ICD-10-CM | POA: Diagnosis not present

## 2022-05-14 DIAGNOSIS — M1990 Unspecified osteoarthritis, unspecified site: Secondary | ICD-10-CM | POA: Diagnosis not present

## 2022-05-14 DIAGNOSIS — G8929 Other chronic pain: Secondary | ICD-10-CM | POA: Diagnosis not present

## 2022-05-14 DIAGNOSIS — J439 Emphysema, unspecified: Secondary | ICD-10-CM | POA: Diagnosis not present

## 2022-05-14 DIAGNOSIS — I130 Hypertensive heart and chronic kidney disease with heart failure and stage 1 through stage 4 chronic kidney disease, or unspecified chronic kidney disease: Secondary | ICD-10-CM | POA: Diagnosis not present

## 2022-05-14 DIAGNOSIS — M797 Fibromyalgia: Secondary | ICD-10-CM | POA: Diagnosis not present

## 2022-05-14 DIAGNOSIS — I2721 Secondary pulmonary arterial hypertension: Secondary | ICD-10-CM | POA: Diagnosis not present

## 2022-05-14 DIAGNOSIS — M5136 Other intervertebral disc degeneration, lumbar region: Secondary | ICD-10-CM | POA: Diagnosis not present

## 2022-05-14 DIAGNOSIS — Z87891 Personal history of nicotine dependence: Secondary | ICD-10-CM | POA: Diagnosis not present

## 2022-05-14 MED ORDER — FLUCONAZOLE 150 MG PO TABS
ORAL_TABLET | ORAL | 0 refills | Status: DC
Start: 2022-05-14 — End: 2022-06-27

## 2022-05-14 NOTE — Telephone Encounter (Signed)
  Chief Complaint: yeast infection- recent antibiotic use Symptoms: itching, small amount white discharge Frequency: several weeks Pertinent Negatives: Patient denies fever,  vaginal bleeding, pain with urination, injury to genital area, vaginal foreign body Disposition: ED /[] Urgent Care (no appt availability in office) / Appointment(In office/virtual)/  [place] [facility]/ Home Care/ Refused Recommended Disposition /[] [place] [facility]/  Follow-up with PCP Additional Notes: Offered appointment- but patient states she is in a lot of pain and does not want to come to office if she doesn't have too. Patient states she could not find the OTC yeast medication at [facility] the last time she was there. Patient is requesting Rx from provider- [facility]  Reason for Disposition  [1] Symptoms of a "yeast infection" (i.e., itchy, white discharge, not bad smelling) AND [2] not improved > 3 days following Care Advice  Answer Assessment - Initial Assessment Questions 1. DISCHARGE: "Describe the discharge." (e.g., white, yellow, green, gray, foamy, cottage cheese-like)     Slight discharge, whitish 2. ODOR: "Is there a bad odor?"     no 3. ONSET: "When did the discharge begin?"     After antibiotic use 4. RASH: "Is there a rash in the genital area?" If Yes, ask: "Describe it." (e.g., redness, blisters, sores, bumps)     no 5. ABDOMEN PAIN: "Are you having any abdomen pain?" If Yes, ask: "What does it feel like? " (e.g., crampy, dull, intermittent, constant)      no 6. ABDOMEN PAIN SEVERITY: If present, ask: "How bad is it?" (e.g., Scale 1-10; mild, moderate, or severe)   - MILD (1-3): Doesn't interfere with normal activities, abdomen soft and not tender to touch.    - MODERATE (4-7): Interferes with normal activities or awakens from sleep, abdomen tender to touch.    - SEVERE (8-10): Excruciating pain, doubled over, unable to do any normal activities. (R/O peritonitis)       none 7. CAUSE: "What do you think is causing the discharge?" "Have you had the same problem before? What happened then?"     Yeast due to antibiotic- patient has had yeast in the past 8. OTHER SYMPTOMS: "Do you have any other symptoms?" (e.g., fever, itching, vaginal bleeding, pain with urination, injury to genital area, vaginal foreign body)     Itching, discharge  Protocols used: Vaginal Discharge-A-AH

## 2022-05-14 NOTE — Telephone Encounter (Signed)
I will agree to send a one time order Diflucan to her pharmacy.  Saralyn Pilar, DO [facility] 05/14/2022, 3:24 PM

## 2022-05-14 NOTE — Addendum Note (Signed)
Addended by: Smitty Cords on: 05/14/2022 03:25 PM   Modules accepted: Orders

## 2022-05-14 NOTE — Telephone Encounter (Signed)
Summary: bad itching and discomfort possible yeast infection   Pt stated she is experiencing bad itching and discomfort possible yeast infection. Going on for a few weeks. Pt declined appointment just wants something called in.  Pt mentioned she is in a lot of pain all over her back and arms. Stated Dr.K is aware.  Pt seeking clinical advice        Attempted to call patient- no answer-left message to call office

## 2022-05-15 ENCOUNTER — Other Ambulatory Visit: Payer: 59

## 2022-05-15 DIAGNOSIS — E7800 Pure hypercholesterolemia, unspecified: Secondary | ICD-10-CM | POA: Diagnosis not present

## 2022-05-15 DIAGNOSIS — M1990 Unspecified osteoarthritis, unspecified site: Secondary | ICD-10-CM | POA: Diagnosis not present

## 2022-05-15 DIAGNOSIS — K589 Irritable bowel syndrome without diarrhea: Secondary | ICD-10-CM | POA: Diagnosis not present

## 2022-05-15 DIAGNOSIS — I509 Heart failure, unspecified: Secondary | ICD-10-CM | POA: Diagnosis not present

## 2022-05-15 DIAGNOSIS — G8929 Other chronic pain: Secondary | ICD-10-CM | POA: Diagnosis not present

## 2022-05-15 DIAGNOSIS — I130 Hypertensive heart and chronic kidney disease with heart failure and stage 1 through stage 4 chronic kidney disease, or unspecified chronic kidney disease: Secondary | ICD-10-CM | POA: Diagnosis not present

## 2022-05-15 DIAGNOSIS — E039 Hypothyroidism, unspecified: Secondary | ICD-10-CM | POA: Diagnosis not present

## 2022-05-15 DIAGNOSIS — I083 Combined rheumatic disorders of mitral, aortic and tricuspid valves: Secondary | ICD-10-CM | POA: Diagnosis not present

## 2022-05-15 DIAGNOSIS — D509 Iron deficiency anemia, unspecified: Secondary | ICD-10-CM | POA: Diagnosis not present

## 2022-05-15 DIAGNOSIS — I700 Atherosclerosis of aorta: Secondary | ICD-10-CM | POA: Diagnosis not present

## 2022-05-15 DIAGNOSIS — I4891 Unspecified atrial fibrillation: Secondary | ICD-10-CM | POA: Diagnosis not present

## 2022-05-15 DIAGNOSIS — M47812 Spondylosis without myelopathy or radiculopathy, cervical region: Secondary | ICD-10-CM | POA: Diagnosis not present

## 2022-05-15 DIAGNOSIS — Z9981 Dependence on supplemental oxygen: Secondary | ICD-10-CM | POA: Diagnosis not present

## 2022-05-15 DIAGNOSIS — Z87891 Personal history of nicotine dependence: Secondary | ICD-10-CM | POA: Diagnosis not present

## 2022-05-15 DIAGNOSIS — Z8673 Personal history of transient ischemic attack (TIA), and cerebral infarction without residual deficits: Secondary | ICD-10-CM | POA: Diagnosis not present

## 2022-05-15 DIAGNOSIS — I2721 Secondary pulmonary arterial hypertension: Secondary | ICD-10-CM | POA: Diagnosis not present

## 2022-05-15 DIAGNOSIS — Z7982 Long term (current) use of aspirin: Secondary | ICD-10-CM | POA: Diagnosis not present

## 2022-05-15 DIAGNOSIS — M797 Fibromyalgia: Secondary | ICD-10-CM | POA: Diagnosis not present

## 2022-05-15 DIAGNOSIS — M5136 Other intervertebral disc degeneration, lumbar region: Secondary | ICD-10-CM | POA: Diagnosis not present

## 2022-05-15 DIAGNOSIS — K219 Gastro-esophageal reflux disease without esophagitis: Secondary | ICD-10-CM | POA: Diagnosis not present

## 2022-05-15 DIAGNOSIS — J439 Emphysema, unspecified: Secondary | ICD-10-CM | POA: Diagnosis not present

## 2022-05-15 DIAGNOSIS — J4489 Other specified chronic obstructive pulmonary disease: Secondary | ICD-10-CM | POA: Diagnosis not present

## 2022-05-15 DIAGNOSIS — N1831 Chronic kidney disease, stage 3a: Secondary | ICD-10-CM | POA: Diagnosis not present

## 2022-05-17 ENCOUNTER — Telehealth: Payer: 59

## 2022-05-20 ENCOUNTER — Telehealth: Payer: Self-pay

## 2022-05-20 ENCOUNTER — Telehealth: Payer: Self-pay | Admitting: Pulmonary Disease

## 2022-05-20 DIAGNOSIS — I509 Heart failure, unspecified: Secondary | ICD-10-CM | POA: Diagnosis not present

## 2022-05-20 DIAGNOSIS — M797 Fibromyalgia: Secondary | ICD-10-CM | POA: Diagnosis not present

## 2022-05-20 DIAGNOSIS — J4489 Other specified chronic obstructive pulmonary disease: Secondary | ICD-10-CM | POA: Diagnosis not present

## 2022-05-20 DIAGNOSIS — I4891 Unspecified atrial fibrillation: Secondary | ICD-10-CM | POA: Diagnosis not present

## 2022-05-20 DIAGNOSIS — Z9981 Dependence on supplemental oxygen: Secondary | ICD-10-CM | POA: Diagnosis not present

## 2022-05-20 DIAGNOSIS — G8929 Other chronic pain: Secondary | ICD-10-CM | POA: Diagnosis not present

## 2022-05-20 DIAGNOSIS — K589 Irritable bowel syndrome without diarrhea: Secondary | ICD-10-CM | POA: Diagnosis not present

## 2022-05-20 DIAGNOSIS — K219 Gastro-esophageal reflux disease without esophagitis: Secondary | ICD-10-CM | POA: Diagnosis not present

## 2022-05-20 DIAGNOSIS — Z87891 Personal history of nicotine dependence: Secondary | ICD-10-CM | POA: Diagnosis not present

## 2022-05-20 DIAGNOSIS — N1831 Chronic kidney disease, stage 3a: Secondary | ICD-10-CM | POA: Diagnosis not present

## 2022-05-20 DIAGNOSIS — I700 Atherosclerosis of aorta: Secondary | ICD-10-CM | POA: Diagnosis not present

## 2022-05-20 DIAGNOSIS — I130 Hypertensive heart and chronic kidney disease with heart failure and stage 1 through stage 4 chronic kidney disease, or unspecified chronic kidney disease: Secondary | ICD-10-CM | POA: Diagnosis not present

## 2022-05-20 DIAGNOSIS — D509 Iron deficiency anemia, unspecified: Secondary | ICD-10-CM | POA: Diagnosis not present

## 2022-05-20 DIAGNOSIS — I2721 Secondary pulmonary arterial hypertension: Secondary | ICD-10-CM | POA: Diagnosis not present

## 2022-05-20 DIAGNOSIS — I083 Combined rheumatic disorders of mitral, aortic and tricuspid valves: Secondary | ICD-10-CM | POA: Diagnosis not present

## 2022-05-20 DIAGNOSIS — J439 Emphysema, unspecified: Secondary | ICD-10-CM | POA: Diagnosis not present

## 2022-05-20 DIAGNOSIS — E039 Hypothyroidism, unspecified: Secondary | ICD-10-CM | POA: Diagnosis not present

## 2022-05-20 DIAGNOSIS — E7800 Pure hypercholesterolemia, unspecified: Secondary | ICD-10-CM | POA: Diagnosis not present

## 2022-05-20 DIAGNOSIS — M47812 Spondylosis without myelopathy or radiculopathy, cervical region: Secondary | ICD-10-CM | POA: Diagnosis not present

## 2022-05-20 DIAGNOSIS — Z7982 Long term (current) use of aspirin: Secondary | ICD-10-CM | POA: Diagnosis not present

## 2022-05-20 DIAGNOSIS — M1990 Unspecified osteoarthritis, unspecified site: Secondary | ICD-10-CM | POA: Diagnosis not present

## 2022-05-20 DIAGNOSIS — Z8673 Personal history of transient ischemic attack (TIA), and cerebral infarction without residual deficits: Secondary | ICD-10-CM | POA: Diagnosis not present

## 2022-05-20 DIAGNOSIS — M5136 Other intervertebral disc degeneration, lumbar region: Secondary | ICD-10-CM | POA: Diagnosis not present

## 2022-05-20 NOTE — Telephone Encounter (Signed)
No need to repeat echo.

## 2022-05-20 NOTE — Telephone Encounter (Signed)
Left message on voice mail  No need to repeat echo  per dr Jayme Cloud

## 2022-05-20 NOTE — Telephone Encounter (Signed)
Beth [name] CXL the Echo appt on 05/21/22

## 2022-05-20 NOTE — Telephone Encounter (Signed)
Dr. Jayme Cloud you saw this patient 01/01/22 and order Echo to be done. Patient was seen in hospital and did Echo Bubble Study on 03/24/22 she has now been scheduled to have echo with [facility] on 05/21/22 does she still need to have the echo again

## 2022-05-21 ENCOUNTER — Ambulatory Visit: Payer: 59

## 2022-05-28 DIAGNOSIS — J441 Chronic obstructive pulmonary disease with (acute) exacerbation: Secondary | ICD-10-CM | POA: Diagnosis not present

## 2022-05-28 DIAGNOSIS — I10 Essential (primary) hypertension: Secondary | ICD-10-CM

## 2022-05-28 DIAGNOSIS — J432 Centrilobular emphysema: Secondary | ICD-10-CM

## 2022-06-05 ENCOUNTER — Telehealth: Payer: Self-pay | Admitting: Pulmonary Disease

## 2022-06-05 NOTE — Telephone Encounter (Signed)
Pt was speaking to a rep from cardiology and was telling them her O2 tank isn't working there are lights flashing. Is asking for someone to reach out

## 2022-06-05 NOTE — Telephone Encounter (Signed)
ATC the patient. LVM for the patient to return my call. 

## 2022-06-06 ENCOUNTER — Telehealth: Payer: Self-pay | Admitting: Family Medicine

## 2022-06-06 DIAGNOSIS — M5136 Other intervertebral disc degeneration, lumbar region: Secondary | ICD-10-CM

## 2022-06-06 DIAGNOSIS — G8929 Other chronic pain: Secondary | ICD-10-CM

## 2022-06-06 NOTE — Telephone Encounter (Signed)
Referral Request - Has patient seen PCP for this complaint? yes *If NO, is insurance requiring patient see PCP for this issue before PCP can refer them? Referral for which specialty: pain management Preferred provider/office: [facility] Reason for referral: pain all over body

## 2022-06-06 NOTE — Telephone Encounter (Signed)
Lm x2 for patient.  Patient will need to contact [employer].   Will call once more due to nature of call.

## 2022-06-07 NOTE — Telephone Encounter (Signed)
New referral has been placed. I saw her for her Chronic Back Pain recently in April. I am not able to manage long term pain for her with this issue, and have asked her to check into available options for Pain Management. She does not plan to return to [facility]. I am not precisely familiar with the location documented in the referral request.  She may be able to share more info on which location is requested.  Saralyn Pilar, DO [facility] 06/07/2022, 4:50 PM

## 2022-06-07 NOTE — Addendum Note (Signed)
Addended by: Smitty Cords on: 06/07/2022 04:50 PM   Modules accepted: Orders

## 2022-06-07 NOTE — Telephone Encounter (Signed)
Nikki, I am not familiar with this location. Would you be able to check into it with patient?  I believe it may be "[facility]"?  Saralyn Pilar, DO [facility] 06/07/2022, 10:14 AM

## 2022-06-07 NOTE — Telephone Encounter (Signed)
I spoke with the patient and gave her [employer]'s phone number. She will call them about her O2 tank.  Nothing further needed.

## 2022-06-15 ENCOUNTER — Other Ambulatory Visit: Payer: Self-pay | Admitting: Psychiatry

## 2022-06-18 ENCOUNTER — Other Ambulatory Visit: Payer: Self-pay | Admitting: Family Medicine

## 2022-06-18 DIAGNOSIS — G8929 Other chronic pain: Secondary | ICD-10-CM

## 2022-06-18 NOTE — Telephone Encounter (Signed)
Requested Prescriptions  Pending Prescriptions Disp Refills   gabapentin (NEURONTIN) 600 MG tablet [Pharmacy Med Name: GABAPENTIN 600MG  TABLETS] 270 tablet 1    Sig: TAKE 1 TABLET(600 MG) BY MOUTH THREE TIMES DAILY     Neurology: Anticonvulsants - gabapentin Failed - 06/18/2022 10:02 AM      Failed - Cr in normal range and within 360 days    Creat  Date Value Ref Range Status  09/21/2021 0.99 0.60 - 1.00 mg/dL Final   Creatinine, Ser  Date Value Ref Range Status  03/24/2022 1.11 (H) 0.44 - 1.00 mg/dL Final         Passed - Completed PHQ-2 or PHQ-9 in the last 360 days      Passed - Valid encounter within last 12 months    Recent Outpatient Visits           1 month ago Chronic right-sided low back pain with right-sided sciatica   [place] [facility] Smitty Cords, DO   2 months ago Pyelonephritis   [place] [facility] Smitty Cords, DO   3 months ago Chronic right-sided low back pain with right-sided sciatica   [place] [facility] [place], Netta Neat, DO   4 months ago Acute bronchitis with COPD [facility])   [place] [facility] Smitty Cords, DO   7 months ago COPD, severe [facility])    [facility] Delles, Gentry Fitz A, RPH-CPP

## 2022-06-25 ENCOUNTER — Other Ambulatory Visit (HOSPITAL_COMMUNITY)
Admission: RE | Admit: 2022-06-25 | Discharge: 2022-06-25 | Disposition: A | Discharge status: Home / Self Care | Payer: 59 | Source: Ambulatory Visit | Attending: Internal Medicine | Admitting: Internal Medicine

## 2022-06-25 ENCOUNTER — Encounter: Payer: Self-pay | Admitting: Internal Medicine

## 2022-06-25 ENCOUNTER — Ambulatory Visit (INDEPENDENT_AMBULATORY_CARE_PROVIDER_SITE_OTHER): Payer: 59 | Admitting: Internal Medicine

## 2022-06-25 VITALS — BP 126/70 | HR 64 | Temp 96.9°F | Wt 144.8 lb

## 2022-06-25 DIAGNOSIS — R350 Frequency of micturition: Secondary | ICD-10-CM | POA: Diagnosis not present

## 2022-06-25 DIAGNOSIS — N898 Other specified noninflammatory disorders of vagina: Secondary | ICD-10-CM | POA: Diagnosis present

## 2022-06-25 DIAGNOSIS — R300 Dysuria: Secondary | ICD-10-CM | POA: Diagnosis not present

## 2022-06-25 MED ORDER — KETOCONAZOLE 2 % EX CREA: 1.0000 | TOPICAL_CREAM | Freq: Two times a day (BID) | CUTANEOUS | 0 refills | Status: DC

## 2022-06-25 NOTE — Progress Notes (Unsigned)
Subjective:    Patient ID: Beth [name], female    DOB: 06/27/1945, 77 y.o.   MRN: [id number]  HPI  Patient presents to clinic today with complaint of vaginal irritation, itching and discharge.  She reports this started 1 month ago.  She reports urinary frequency, dysuria and burning with urination but she does not feel like this is a UTI, she reports she feels like it is hurting and burning because of the urine coming contact with the vaginal irritation.  She denies pelvic pain, blood in her urine, abnormal vaginal bleeding.  She denies fever, chills, nausea or vomiting.  She has tried Diflucan and Neosporin with minimal relief of symptoms.  She is not sexually active.  Review of Systems     Past Medical History:  Diagnosis Date   Allergy    Anxiety    Asthma    Chronic pain syndrome    discharged from pain clinic, hx of narcotics seeking behavior   COPD (chronic obstructive pulmonary disease) (HCC)    CVA (cerebral infarction)    Depression    Fibromyalgia    Headache    Hyperlipidemia    Hypertension    IBS (irritable bowel syndrome)    Stroke (HCC)    Vitamin D deficiency     Current Outpatient Medications  Medication Sig Dispense Refill   acetaminophen (TYLENOL) 500 MG tablet Take 500 mg by mouth 2 (two) times daily as needed.     albuterol (VENTOLIN HFA) 108 (90 Base) MCG/ACT inhaler Inhale 1-2 puffs into the lungs every 6 (six) hours as needed for wheezing or shortness of breath. 18 g 2   aspirin 81 MG chewable tablet Chew 81 mg by mouth daily.     atorvastatin (LIPITOR) 40 MG tablet TAKE 1 TABLET BY MOUTH EVERY DAY 90 tablet 3   fluconazole (DIFLUCAN) 150 MG tablet Take one tablet by mouth on Day 1. Repeat dose 2nd tablet on Day 3. 2 tablet 0   FLUoxetine (PROZAC) 20 MG capsule TAKE 3 CAPSULES(60 MG) BY MOUTH DAILY 270 capsule 1   furosemide (LASIX) 20 MG tablet Take 2 tablets (40 mg total) by mouth daily. If swelling is less, can take only 1 pill. 180 tablet 3    gabapentin (NEURONTIN) 600 MG tablet TAKE 1 TABLET(600 MG) BY MOUTH THREE TIMES DAILY 270 tablet 1   LORazepam (ATIVAN) 0.5 MG tablet Take 1 tablet (0.5 mg total) by mouth every other day. 15 tablet 1   Melatonin 3 MG CAPS Take 10 mg by mouth at bedtime as needed (sleep).      methocarbamol (ROBAXIN) 500 MG tablet Take 1 tablet (500 mg total) by mouth every 8 (eight) hours as needed for muscle spasms. 90 tablet 1   metoprolol succinate (TOPROL-XL) 25 MG 24 hr tablet TAKE 1/2 TABLET(12.5 MG) BY MOUTH DAILY 45 tablet 1   montelukast (SINGULAIR) 10 MG tablet TAKE 1 TABLET(10 MG) BY MOUTH AT BEDTIME 90 tablet 1   ondansetron (ZOFRAN-ODT) 4 MG disintegrating tablet Take 1 tablet (4 mg total) by mouth every 8 (eight) hours as needed for nausea or vomiting. 30 tablet 0   potassium chloride (KLOR-CON) 10 MEQ tablet TAKE 1 TABLET(10 MEQ) BY MOUTH DAILY 90 tablet 2   QUEtiapine (SEROQUEL) 200 MG tablet Take 1 tablet (200 mg total) by mouth at bedtime. Take total of 250 mg. Take along with 50 mg tab 90 tablet 0   QUEtiapine (SEROQUEL) 50 MG tablet Take 1 tablet (50 mg total)  by mouth at bedtime. Total of 250 mg at night. Take along with 200 mg tab 90 tablet 0   TRELEGY ELLIPTA 200-62.5-25 MCG/ACT AEPB INHALE 1 PUFF INTO THE LUNGS DAILY 60 each 2   No current facility-administered medications for this visit.    Allergies  Allergen Reactions   Bextra  [Valdecoxib]    Compazine [Prochlorperazine Edisylate]     Stroke-like symptoms   Lithium Carbonate     Leg weakness   Lyrica [Pregabalin]     Family History  Problem Relation Age of Onset   Anxiety disorder Mother    Depression Mother    Breast cancer Mother 73   Cancer Father    Gallbladder disease Father    Alcohol abuse Father    Depression Father    Bipolar disorder Son     Social History   Socioeconomic History   Marital status: Legally Separated    Spouse name: Not on file   Number of children: 2   Years of education: Not on file    Highest education level: Not on file  Occupational History   Occupation: retired  Tobacco Use   Smoking status: Former    Packs/day: 0.25    Years: 57.00    Additional pack years: 0.00    Total pack years: 14.25    Types: Cigarettes    Quit date: 10/24/2020    Years since quitting: 1.6   Smokeless tobacco: Never   Tobacco comments:    6-8cig daily--01/01/2022  Vaping Use   Vaping Use: Never used  Substance and Sexual Activity   Alcohol use: No    Alcohol/week: 0.0 standard drinks of alcohol   Drug use: No   Sexual activity: Not Currently  Other Topics Concern   Not on file  Social History Narrative   Not on file   Social Determinants of Health   Financial Resource Strain: Low Risk  (02/01/2022)   Overall Financial Resource Strain (CARDIA)    Difficulty of Paying Living Expenses: Not hard at all  Food Insecurity: No Food Insecurity (02/01/2022)   Hunger Vital Sign    Worried About Running Out of Food in the Last Year: Never true    Ran Out of Food in the Last Year: Never true  Transportation Needs: No Transportation Needs (02/01/2022)   PRAPARE - Administrator, Civil Service (Medical): No    Lack of Transportation (Non-Medical): No  Physical Activity: Insufficiently Active (02/01/2022)   Exercise Vital Sign    Days of Exercise per Week: 7 days    Minutes of Exercise per Session: 20 min  Stress: No Stress Concern Present (02/01/2022)   [employer] of Occupational Health - Occupational Stress Questionnaire    Feeling of Stress : Only a little  Social Connections: Socially Isolated (02/01/2022)   Social Connection and Isolation Panel [NHANES]    Frequency of Communication with Friends and Family: Twice a week    Frequency of Social Gatherings with Friends and Family: Once a week    Attends Religious Services: Never    Database administrator or Organizations: No    Attends Banker Meetings: Never    Marital Status: Separated  Intimate Partner  Violence: Not At Risk (02/01/2022)   Humiliation, Afraid, Rape, and Kick questionnaire    Fear of Current or Ex-Partner: No    Emotionally Abused: No    Physically Abused: No    Sexually Abused: No     Constitutional: Denies fever, malaise,  fatigue, headache or abrupt weight changes.  Respiratory: Denies difficulty breathing, shortness of breath, cough or sputum production.   Cardiovascular: Denies chest pain, chest tightness, palpitations or swelling in the hands or feet.  Gastrointestinal: Denies abdominal pain, bloating, constipation, diarrhea or blood in the stool.  GU: Patient reports vaginal irritation, itching, discharge, urinary frequency, pain with urination and burning with urination.  Denies urgency, blood in urine, odor. Skin: Denies redness, rashes, lesions or ulcercations.   No other specific complaints in a complete review of systems (except as listed in HPI above).  Objective:   Physical Exam  BP 126/70 (BP Location: Right Arm, Patient Position: Sitting, Cuff Size: Normal)   Pulse 64   Temp (!) 96.9 F (36.1 C) (Temporal)   Wt 144 lb 12.8 oz (65.7 kg)   SpO2 94%   BMI 25.65 kg/m   Wt Readings from Last 3 Encounters:  04/24/22 148 lb 9.6 oz (67.4 kg)  04/05/22 147 lb 12.8 oz (67 kg)  03/24/22 145 lb 1 oz (65.8 kg)    General: Appears her stated age, overweight in NAD. Cardiovascular: Normal rate and rhythm.  Pulmonary/Chest: Normal effort and positive vesicular breath sounds. No respiratory distress. No wheezes, rales or ronchi noted.  Abdomen: Soft and nontender. Normal bowel sounds.  No CVA tenderness noted. Pelvic: Normal female anatomy.  Inside of labia majora and minora erythematous with a white coating with multiple areas of excoriation noted.  No discrete lesion noted. Musculoskeletal: No difficulty with gait.  Neurological: Alert and oriented.      BMET    Component Value Date/Time   NA 139 03/24/2022 0510   NA 143 01/01/2021 1526   NA 144  12/26/2012 0520   K 3.9 03/24/2022 0510   K 3.3 (L) 12/26/2012 0520   CL 104 03/24/2022 0510   CL 114 (H) 12/26/2012 0520   CO2 28 03/24/2022 0510   CO2 24 12/26/2012 0520   GLUCOSE 102 (H) 03/24/2022 0510   GLUCOSE 85 12/26/2012 0520   BUN 23 03/24/2022 0510   BUN 19 01/01/2021 1526   BUN 13 12/26/2012 0520   CREATININE 1.11 (H) 03/24/2022 0510   CREATININE 0.99 09/21/2021 1413   CALCIUM 8.6 (L) 03/24/2022 0510   CALCIUM 9.1 12/26/2012 0520   GFRNONAA 51 (L) 03/24/2022 0510   GFRNONAA 55 (L) 01/14/2020 1144   GFRAA 63 01/14/2020 1144    Lipid Panel     Component Value Date/Time   CHOL 133 03/24/2022 0510   CHOL 118 03/23/2015 1536   CHOL 185 12/25/2012 1449   TRIG 40 03/24/2022 0510   TRIG 93 12/25/2012 1449   HDL 52 03/24/2022 0510   HDL 44 03/23/2015 1536   HDL 39 (L) 12/25/2012 1449   CHOLHDL 2.6 03/24/2022 0510   VLDL 8 03/24/2022 0510   VLDL 19 12/25/2012 1449   LDLCALC 73 03/24/2022 0510   LDLCALC 64 04/10/2018 1115   LDLCALC 127 (H) 12/25/2012 1449    CBC    Component Value Date/Time   WBC 12.2 (H) 03/24/2022 0510   RBC 3.38 (L) 03/24/2022 0510   HGB 10.2 (L) 03/24/2022 0510   HGB 12.2 12/25/2012 1449   HCT 32.4 (L) 03/24/2022 0510   HCT 36.1 12/25/2012 1449   PLT 198 03/24/2022 0510   PLT 166 12/29/2012 0553   MCV 95.9 03/24/2022 0510   MCV 88 12/25/2012 1449   MCH 30.2 03/24/2022 0510   MCHC 31.5 03/24/2022 0510   RDW 15.5 03/24/2022 0510  RDW 15.6 (H) 12/25/2012 1449   LYMPHSABS 1.7 03/23/2022 2346   LYMPHSABS 2.6 12/25/2012 1449   MONOABS 1.0 03/23/2022 2346   MONOABS 0.4 12/25/2012 1449   EOSABS 0.3 03/23/2022 2346   EOSABS 0.1 12/25/2012 1449   BASOSABS 0.0 03/23/2022 2346   BASOSABS 0.1 12/25/2012 1449    Hgb A1C Lab Results  Component Value Date   HGBA1C 5.9 (H) 03/24/2022           Assessment & Plan:   Vaginal Irritation, Vaginal Itching, Vaginal Discharge, Urinary Frequency, Dysuria, Burning with Urination:  Pelvic  exam consistent with either Candida versus lichen sclerosis Rx for Ketoconazole cream BID to external labia Wet prep to check for BV and yeast Urinalysis positive for leuks Will send urine culture  Follow-up with your PCP as previously scheduled Nicki Reaper, NP

## 2022-06-25 NOTE — Patient Instructions (Signed)
Vaginitis  Vaginitis is irritation and swelling of the vagina. Treatment will depend on the cause. What are the causes? It can be caused by: Bacteria. Yeast. A parasite. A virus. Low hormone levels. Bubble baths, scented tampons, and feminine sprays. Other things can change the balance of the yeast and bacteria that live in the vagina. These include: Antibiotic medicines. Not being clean enough. Some birth control methods. Sex. Infection. Diabetes. A weakened body defense system (immune system). What increases the risk? Smoking or being around someone who smokes. Using washes (douches), scented tampons, or scented pads. Wearing tight pants or thong underwear. Using birth control pills or an IUD. Having sex without a condom or having a lot of partners. Having an STI. Using a certain product to kill sperm (nonoxynol-9). Eating foods that are high in sugar. Having diabetes. Having low levels of a female hormone. Having a weakened body defense system. Being pregnant or breastfeeding. What are the signs or symptoms? Fluid coming from the vagina that is not normal. A bad smell. Itching, pain, or swelling. Pain with sex. Pain or burning when you pee (urinate). Sometimes there are no symptoms. How is this treated? Treatment may include: Antibiotic creams or pills. Antifungal medicines. Medicines to ease symptoms if you have a virus. Your sex partner should also be treated. Estrogen medicines. Avoiding scented soaps, sprays, or douches. Stopping use of products that caused irritation and then using a cream to treat symptoms. Follow these instructions at home: Lifestyle Keep the area around your vagina clean and dry. Avoid using soap. Rinse the area with water. Until your doctor says it is okay: Do not use washes for the vagina. Do not use tampons. Do not have sex. Wipe from front to back after going to the bathroom. When your doctor says it is okay, practice safe sex  and use condoms. General instructions Take over-the-counter and prescription medicines only as told by your doctor. If you were prescribed an antibiotic medicine, take or use it as told by your doctor. Do not stop taking or using it even if you start to feel better. Keep all follow-up visits. How is this prevented? Do not use things that can irritate the vagina, such as fabric softeners. Avoid these products if they are scented: Sprays. Detergents. Tampons. Products for cleaning the vagina. Soaps or bubble baths. Let air reach your vagina. To do this: Wear cotton underwear. Do not wear: Underwear while you sleep. Tight pants. Thong underwear. Underwear or nylons without a cotton panel. Take off any wet clothing, such as bathing suits, as soon as you can. Practice safe sex and use condoms. Contact a doctor if: You have pain in your belly or in the area between your hips. You have a fever or chills. Your symptoms last for more than 2-3 days. Get help right away if: You have a fever and your symptoms get worse all of a sudden. Summary Vaginitis is irritation and swelling of the vagina. Treatment will depend on the cause of the condition. Do not use washes or tampons or have sex until your doctor says it is okay. This information is not intended to replace advice given to you by your health care provider. Make sure you discuss any questions you have with your health care provider. Document Revised: 07/15/2019 Document Reviewed: 07/15/2019 Elsevier Patient Education  2024 [employer].

## 2022-06-26 DIAGNOSIS — R300 Dysuria: Secondary | ICD-10-CM | POA: Diagnosis not present

## 2022-06-26 DIAGNOSIS — R350 Frequency of micturition: Secondary | ICD-10-CM | POA: Diagnosis not present

## 2022-06-26 LAB — POCT URINALYSIS DIPSTICK
Bilirubin, UA: NEGATIVE
Glucose, UA: NEGATIVE
Ketones, UA: NEGATIVE
Nitrite, UA: NEGATIVE
Protein, UA: NEGATIVE
Spec Grav, UA: 1.01 (ref 1.010–1.025)
Urobilinogen, UA: 0.2 E.U./dL
pH, UA: 5 (ref 5.0–8.0)

## 2022-06-27 ENCOUNTER — Telehealth: Payer: Self-pay

## 2022-06-27 DIAGNOSIS — J441 Chronic obstructive pulmonary disease with (acute) exacerbation: Secondary | ICD-10-CM | POA: Diagnosis not present

## 2022-06-27 LAB — CERVICOVAGINAL ANCILLARY ONLY
Bacterial Vaginitis (gardnerella): POSITIVE — AB
Candida Glabrata: NEGATIVE
Candida Vaginitis: POSITIVE — AB
Comment: NEGATIVE
Comment: NEGATIVE
Comment: NEGATIVE

## 2022-06-27 MED ORDER — METRONIDAZOLE 500 MG PO TABS: 500.0000 mg | ORAL_TABLET | Freq: Two times a day (BID) | ORAL | 0 refills | Status: DC

## 2022-06-27 MED ORDER — FLUCONAZOLE 100 MG PO TABS: 100.0000 mg | ORAL_TABLET | Freq: Every day | ORAL | 0 refills | Status: DC

## 2022-06-27 NOTE — Telephone Encounter (Signed)
Informed patient of results and recommendations. 

## 2022-06-27 NOTE — Telephone Encounter (Signed)
-----   Message from Lorre Munroe, NP sent at 06/27/2022  1:02 PM EDT ----- Mellody Drown prep positive for BV and yeast. I am sending in pills to treat this.

## 2022-06-27 NOTE — Addendum Note (Signed)
Addended by: Lorre Munroe on: 06/27/2022 01:02 PM   Modules accepted: Orders

## 2022-06-28 LAB — URINE CULTURE
MICRO NUMBER:: 15014617
SPECIMEN QUALITY:: ADEQUATE

## 2022-06-28 MED ORDER — NITROFURANTOIN MONOHYD MACRO 100 MG PO CAPS: 100.0000 mg | ORAL_CAPSULE | Freq: Two times a day (BID) | ORAL | 0 refills | Status: DC

## 2022-06-28 NOTE — Addendum Note (Signed)
Addended by: Lorre Munroe on: 06/28/2022 08:54 PM   Modules accepted: Orders
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
## 2022-07-11 ENCOUNTER — Ambulatory Visit: Payer: 59 | Admitting: Psychiatry

## 2022-07-24 ENCOUNTER — Ambulatory Visit: Payer: Self-pay

## 2022-07-24 NOTE — Telephone Encounter (Signed)
Summary: possible yeast infection   Patient stated she was seen by the provider on 5/30 for a yeast infection and she is having the same symptoms again. Patient is requesting to have metroNIDAZOLE (FLAGYL) 500 MG tablet called in to [facility] #[id number] - [place], [place] - [address] AT [facility] [address] [facility] Phone: [phone] Fax: [phone]  Left message to call back about symptoms.

## 2022-07-24 NOTE — Telephone Encounter (Signed)
Message from Elon Jester sent at 07/24/2022  3:01 PM EDT  Summary: possible yeast infection   Patient stated she was seen by the provider on 5/30 for a yeast infection and she is having the same symptoms again. Patient is requesting to have metroNIDAZOLE (FLAGYL) 500 MG tablet called in to [facility] #[id number] - [place], [place] - [address] AT [facility] OF [address] [facility] Phone: [phone] Fax: [phone]        Called pt and LM on VM to call back to discuss sx.

## 2022-07-24 NOTE — Telephone Encounter (Signed)
Chief Complaint: Vaginal itching Symptoms: itching outside and some inside vagina 7/10, pain to vagina 5/10 Frequency: 2 days ago Pertinent Negatives: Patient denies other symptoms Disposition: [] ED /[] Urgent Care (no appt availability in office) / [x] Appointment(In office/virtual)/ []  [place] [facility]/ [] Home Care/ [] Refused Recommended Disposition /[] [place] [facility]/ []  Follow-up with PCP Additional Notes: Advised would need an OV, she says it's the same as before when she was prescribed metronidazole. Advised since it's been since May when this happened, will need another evaluation. She asked for a female because she would be embarrassed with a female doctor. Advised first available is on Monday, 07/29/22 with Rene Kocher, she says after 1100 is fine. Advised I placed on the waiting list and will send this to Dr. Althea Charon to review and offer sooner appointment if available.     Summary: possible yeast infection   Patient stated she was seen by the provider on 5/30 for a yeast infection and she is having the same symptoms again. Patient is requesting to have metroNIDAZOLE (FLAGYL) 500 MG tablet called in to [facility] #[id number] - [place], [place] - [address] AT [facility] OF [address] [place] Phone: [phone] Fax: [phone]     Reason for Disposition  MODERATE-SEVERE itching (i.e., interferes with school, work, or sleep)  Answer Assessment - Initial Assessment Questions 1. SYMPTOM: "What's the main symptom you're concerned about?" (e.g., pain, itching, dryness)     Itching, pain 2. LOCATION: "Where is the itching located?" (e.g., inside/outside, left/right)     Mostly outside and some inside 3. ONSET: "When did the symptoms start?"     2 days 4. PAIN: "Is there any pain?" If Yes, ask: "How bad is it?" (Scale: 1-10; mild, moderate, severe)   -  MILD (1-3): Doesn't interfere with normal activities.    -  MODERATE (4-7): Interferes with normal activities (e.g., work  or school) or awakens from sleep.     -  SEVERE (8-10): Excruciating pain, unable to do any normal activities.     5 5. ITCHING: "Is there any itching?" If Yes, ask: "How bad is it?" (Scale: 1-10; mild, moderate, severe)     7 6. CAUSE: "What do you think is causing the discharge?" "Have you had the same problem before? What happened then?"     Yeast infection prescribed flagyl 7. OTHER SYMPTOMS: "Do you have any other symptoms?" (e.g., fever, itching, vaginal bleeding, pain with urination, injury to genital area, vaginal foreign body)     No  Protocols used: Vaginal Symptoms-A-AH

## 2022-07-25 ENCOUNTER — Encounter: Payer: Self-pay | Admitting: Internal Medicine

## 2022-07-25 ENCOUNTER — Other Ambulatory Visit (HOSPITAL_COMMUNITY)
Admission: RE | Admit: 2022-07-25 | Discharge: 2022-07-25 | Disposition: A | Discharge status: Home / Self Care | Payer: 59 | Source: Ambulatory Visit | Attending: Internal Medicine | Admitting: Internal Medicine

## 2022-07-25 ENCOUNTER — Ambulatory Visit (INDEPENDENT_AMBULATORY_CARE_PROVIDER_SITE_OTHER): Payer: 59 | Admitting: Internal Medicine

## 2022-07-25 VITALS — BP 136/68 | HR 89 | Temp 96.6°F | Wt 144.0 lb

## 2022-07-25 DIAGNOSIS — N898 Other specified noninflammatory disorders of vagina: Secondary | ICD-10-CM | POA: Insufficient documentation

## 2022-07-25 MED ORDER — FLUCONAZOLE 150 MG PO TABS: 150.0000 mg | ORAL_TABLET | Freq: Once | ORAL | 0 refills | Status: AC

## 2022-07-25 NOTE — Progress Notes (Signed)
Subjective:    Patient ID: Beth [name], female    DOB: 03/28/1945, 77 y.o.   MRN: [id number]  HPI  Patient presents to clinic today with complaint of vaginal itching and irritation.  She reports this started 1 week ago. She denies vaginal discharge, odor, abnormal vaginal bleeding. She does not douche or take bubble baths. She is not sexually active. She denies urinary symptoms. She was seen 5/28 for the same, wet prep revealed bacterial vaginosis and vaginal candidiasis.  She was treated with metronidazole and fluconazole.  Review of Systems     Past Medical History:  Diagnosis Date   Allergy    Anxiety    Asthma    Chronic pain syndrome    discharged from pain clinic, hx of narcotics seeking behavior   COPD (chronic obstructive pulmonary disease) (HCC)    CVA (cerebral infarction)    Depression    Fibromyalgia    Headache    Hyperlipidemia    Hypertension    IBS (irritable bowel syndrome)    Stroke (HCC)    Vitamin D deficiency     Current Outpatient Medications  Medication Sig Dispense Refill   acetaminophen (TYLENOL) 500 MG tablet Take 500 mg by mouth 2 (two) times daily as needed.     albuterol (VENTOLIN HFA) 108 (90 Base) MCG/ACT inhaler Inhale 1-2 puffs into the lungs every 6 (six) hours as needed for wheezing or shortness of breath. 18 g 2   aspirin 81 MG chewable tablet Chew 81 mg by mouth daily.     atorvastatin (LIPITOR) 40 MG tablet TAKE 1 TABLET BY MOUTH EVERY DAY 90 tablet 3   fluconazole (DIFLUCAN) 100 MG tablet Take 1 tablet (100 mg total) by mouth daily. 5 tablet 0   FLUoxetine (PROZAC) 20 MG capsule TAKE 3 CAPSULES(60 MG) BY MOUTH DAILY 270 capsule 1   furosemide (LASIX) 20 MG tablet Take 2 tablets (40 mg total) by mouth daily. If swelling is less, can take only 1 pill. 180 tablet 3   gabapentin (NEURONTIN) 600 MG tablet TAKE 1 TABLET(600 MG) BY MOUTH THREE TIMES DAILY 270 tablet 1   ketoconazole (NIZORAL) 2 % cream Apply 1 Application topically 2  (two) times daily. 30 g 0   LORazepam (ATIVAN) 0.5 MG tablet Take 1 tablet (0.5 mg total) by mouth every other day. 15 tablet 1   Melatonin 3 MG CAPS Take 10 mg by mouth at bedtime as needed (sleep).      methocarbamol (ROBAXIN) 500 MG tablet Take 1 tablet (500 mg total) by mouth every 8 (eight) hours as needed for muscle spasms. 90 tablet 1   metoprolol succinate (TOPROL-XL) 25 MG 24 hr tablet TAKE 1/2 TABLET(12.5 MG) BY MOUTH DAILY 45 tablet 1   metroNIDAZOLE (FLAGYL) 500 MG tablet Take 1 tablet (500 mg total) by mouth 2 (two) times daily. Do not drink alcohol while taking this medicine. 14 tablet 0   montelukast (SINGULAIR) 10 MG tablet TAKE 1 TABLET(10 MG) BY MOUTH AT BEDTIME 90 tablet 1   nitrofurantoin, macrocrystal-monohydrate, (MACROBID) 100 MG capsule Take 1 capsule (100 mg total) by mouth 2 (two) times daily. 10 capsule 0   ondansetron (ZOFRAN-ODT) 4 MG disintegrating tablet Take 1 tablet (4 mg total) by mouth every 8 (eight) hours as needed for nausea or vomiting. 30 tablet 0   potassium chloride (KLOR-CON) 10 MEQ tablet TAKE 1 TABLET(10 MEQ) BY MOUTH DAILY 90 tablet 2   QUEtiapine (SEROQUEL) 200 MG tablet Take 1 tablet (  200 mg total) by mouth at bedtime. Take total of 250 mg. Take along with 50 mg tab 90 tablet 0   QUEtiapine (SEROQUEL) 50 MG tablet Take 1 tablet (50 mg total) by mouth at bedtime. Total of 250 mg at night. Take along with 200 mg tab 90 tablet 0   TRELEGY ELLIPTA 200-62.5-25 MCG/ACT AEPB INHALE 1 PUFF INTO THE LUNGS DAILY 60 each 2   No current facility-administered medications for this visit.    Allergies  Allergen Reactions   Bextra  [Valdecoxib]    Compazine [Prochlorperazine Edisylate]     Stroke-like symptoms   Lithium Carbonate     Leg weakness   Lyrica [Pregabalin]     Family History  Problem Relation Age of Onset   Anxiety disorder Mother    Depression Mother    Breast cancer Mother 53   Cancer Father    Gallbladder disease Father    Alcohol abuse  Father    Depression Father    Bipolar disorder Son     Social History   Socioeconomic History   Marital status: Legally Separated    Spouse name: Not on file   Number of children: 2   Years of education: Not on file   Highest education level: Not on file  Occupational History   Occupation: retired  Tobacco Use   Smoking status: Former    Packs/day: 0.25    Years: 57.00    Additional pack years: 0.00    Total pack years: 14.25    Types: Cigarettes    Quit date: 10/24/2020    Years since quitting: 1.7   Smokeless tobacco: Never   Tobacco comments:    6-8cig daily--01/01/2022  Vaping Use   Vaping Use: Never used  Substance and Sexual Activity   Alcohol use: No    Alcohol/week: 0.0 standard drinks of alcohol   Drug use: No   Sexual activity: Not Currently  Other Topics Concern   Not on file  Social History Narrative   Not on file   Social Determinants of Health   Financial Resource Strain: Low Risk  (02/01/2022)   Overall Financial Resource Strain (CARDIA)    Difficulty of Paying Living Expenses: Not hard at all  Food Insecurity: No Food Insecurity (02/01/2022)   Hunger Vital Sign    Worried About Running Out of Food in the Last Year: Never true    Ran Out of Food in the Last Year: Never true  Transportation Needs: No Transportation Needs (02/01/2022)   PRAPARE - Administrator, Civil Service (Medical): No    Lack of Transportation (Non-Medical): No  Physical Activity: Insufficiently Active (02/01/2022)   Exercise Vital Sign    Days of Exercise per Week: 7 days    Minutes of Exercise per Session: 20 min  Stress: No Stress Concern Present (02/01/2022)   [employer] of Occupational Health - Occupational Stress Questionnaire    Feeling of Stress : Only a little  Social Connections: Socially Isolated (02/01/2022)   Social Connection and Isolation Panel [NHANES]    Frequency of Communication with Friends and Family: Twice a week    Frequency of Social  Gatherings with Friends and Family: Once a week    Attends Religious Services: Never    Database administrator or Organizations: No    Attends Banker Meetings: Never    Marital Status: Separated  Intimate Partner Violence: Not At Risk (02/01/2022)   Humiliation, Afraid, Rape, and Kick questionnaire  Fear of Current or Ex-Partner: No    Emotionally Abused: No    Physically Abused: No    Sexually Abused: No     Constitutional: Denies fever, malaise, fatigue, headache or abrupt weight changes.  Respiratory: Denies difficulty breathing, shortness of breath, cough or sputum production.   Cardiovascular: Denies chest pain, chest tightness, palpitations or swelling in the hands or feet.  Gastrointestinal: Denies abdominal pain, bloating, constipation, diarrhea or blood in the stool.  GU: Patient reports vaginal itching and irritation.  Denies urgency, frequency, pain with urination, blood in urine, odor or discharge.  No other specific complaints in a complete review of systems (except as listed in HPI above).  Objective:   Physical Exam  BP 136/68 (BP Location: Left Arm, Patient Position: Sitting, Cuff Size: Normal)   Pulse 89   Temp (!) 96.6 F (35.9 C) (Temporal)   Wt 144 lb (65.3 kg)   SpO2 95%   BMI 25.51 kg/m   Wt Readings from Last 3 Encounters:  06/25/22 144 lb 12.8 oz (65.7 kg)  04/24/22 148 lb 9.6 oz (67.4 kg)  04/05/22 147 lb 12.8 oz (67 kg)    General: Appears her stated age, well developed, well nourished in NAD. Cardiovascular: Normal rate. Pulmonary/Chest: Normal effort. Abdomen: Soft and nontender. Normal bowel sounds.  Pelvic: Self swabbed.  Neurological: Alert and oriented.   BMET    Component Value Date/Time   NA 139 03/24/2022 0510   NA 143 01/01/2021 1526   NA 144 12/26/2012 0520   K 3.9 03/24/2022 0510   K 3.3 (L) 12/26/2012 0520   CL 104 03/24/2022 0510   CL 114 (H) 12/26/2012 0520   CO2 28 03/24/2022 0510   CO2 24 12/26/2012  0520   GLUCOSE 102 (H) 03/24/2022 0510   GLUCOSE 85 12/26/2012 0520   BUN 23 03/24/2022 0510   BUN 19 01/01/2021 1526   BUN 13 12/26/2012 0520   CREATININE 1.11 (H) 03/24/2022 0510   CREATININE 0.99 09/21/2021 1413   CALCIUM 8.6 (L) 03/24/2022 0510   CALCIUM 9.1 12/26/2012 0520   GFRNONAA 51 (L) 03/24/2022 0510   GFRNONAA 55 (L) 01/14/2020 1144   GFRAA 63 01/14/2020 1144    Lipid Panel     Component Value Date/Time   CHOL 133 03/24/2022 0510   CHOL 118 03/23/2015 1536   CHOL 185 12/25/2012 1449   TRIG 40 03/24/2022 0510   TRIG 93 12/25/2012 1449   HDL 52 03/24/2022 0510   HDL 44 03/23/2015 1536   HDL 39 (L) 12/25/2012 1449   CHOLHDL 2.6 03/24/2022 0510   VLDL 8 03/24/2022 0510   VLDL 19 12/25/2012 1449   LDLCALC 73 03/24/2022 0510   LDLCALC 64 04/10/2018 1115   LDLCALC 127 (H) 12/25/2012 1449    CBC    Component Value Date/Time   WBC 12.2 (H) 03/24/2022 0510   RBC 3.38 (L) 03/24/2022 0510   HGB 10.2 (L) 03/24/2022 0510   HGB 12.2 12/25/2012 1449   HCT 32.4 (L) 03/24/2022 0510   HCT 36.1 12/25/2012 1449   PLT 198 03/24/2022 0510   PLT 166 12/29/2012 0553   MCV 95.9 03/24/2022 0510   MCV 88 12/25/2012 1449   MCH 30.2 03/24/2022 0510   MCHC 31.5 03/24/2022 0510   RDW 15.5 03/24/2022 0510   RDW 15.6 (H) 12/25/2012 1449   LYMPHSABS 1.7 03/23/2022 2346   LYMPHSABS 2.6 12/25/2012 1449   MONOABS 1.0 03/23/2022 2346   MONOABS 0.4 12/25/2012 1449   EOSABS  0.3 03/23/2022 2346   EOSABS 0.1 12/25/2012 1449   BASOSABS 0.0 03/23/2022 2346   BASOSABS 0.1 12/25/2012 1449    Hgb A1C Lab Results  Component Value Date   HGBA1C 5.9 (H) 03/24/2022            Assessment & Plan:   Vaginal itching and irritation:  Wet prep to check for BV and yeast Rx for fluconazole 150 mg p.o. x 1, can repeat in 3 days if needed  Will follow-up after wet prep results are back with further recommendation and treatment plan Nicki Reaper, NP

## 2022-07-25 NOTE — Patient Instructions (Signed)
Vaginitis  Vaginitis is irritation and swelling of the vagina. Treatment will depend on the cause. What are the causes? It can be caused by: Bacteria. Yeast. A parasite. A virus. Low hormone levels. Bubble baths, scented tampons, and feminine sprays. Other things can change the balance of the yeast and bacteria that live in the vagina. These include: Antibiotic medicines. Not being clean enough. Some birth control methods. Sex. Infection. Diabetes. A weakened body defense system (immune system). What increases the risk? Smoking or being around someone who smokes. Using washes (douches), scented tampons, or scented pads. Wearing tight pants or thong underwear. Using birth control pills or an IUD. Having sex without a condom or having a lot of partners. Having an STI. Using a certain product to kill sperm (nonoxynol-9). Eating foods that are high in sugar. Having diabetes. Having low levels of a female hormone. Having a weakened body defense system. Being pregnant or breastfeeding. What are the signs or symptoms? Fluid coming from the vagina that is not normal. A bad smell. Itching, pain, or swelling. Pain with sex. Pain or burning when you pee (urinate). Sometimes there are no symptoms. How is this treated? Treatment may include: Antibiotic creams or pills. Antifungal medicines. Medicines to ease symptoms if you have a virus. Your sex partner should also be treated. Estrogen medicines. Avoiding scented soaps, sprays, or douches. Stopping use of products that caused irritation and then using a cream to treat symptoms. Follow these instructions at home: Lifestyle Keep the area around your vagina clean and dry. Avoid using soap. Rinse the area with water. Until your doctor says it is okay: Do not use washes for the vagina. Do not use tampons. Do not have sex. Wipe from front to back after going to the bathroom. When your doctor says it is okay, practice safe sex  and use condoms. General instructions Take over-the-counter and prescription medicines only as told by your doctor. If you were prescribed an antibiotic medicine, take or use it as told by your doctor. Do not stop taking or using it even if you start to feel better. Keep all follow-up visits. How is this prevented? Do not use things that can irritate the vagina, such as fabric softeners. Avoid these products if they are scented: Sprays. Detergents. Tampons. Products for cleaning the vagina. Soaps or bubble baths. Let air reach your vagina. To do this: Wear cotton underwear. Do not wear: Underwear while you sleep. Tight pants. Thong underwear. Underwear or nylons without a cotton panel. Take off any wet clothing, such as bathing suits, as soon as you can. Practice safe sex and use condoms. Contact a doctor if: You have pain in your belly or in the area between your hips. You have a fever or chills. Your symptoms last for more than 2-3 days. Get help right away if: You have a fever and your symptoms get worse all of a sudden. Summary Vaginitis is irritation and swelling of the vagina. Treatment will depend on the cause of the condition. Do not use washes or tampons or have sex until your doctor says it is okay. This information is not intended to replace advice given to you by your health care provider. Make sure you discuss any questions you have with your health care provider. Document Revised: 07/15/2019 Document Reviewed: 07/15/2019 Elsevier Patient Education  2024 [employer]  

## 2022-07-26 ENCOUNTER — Ambulatory Visit (INDEPENDENT_AMBULATORY_CARE_PROVIDER_SITE_OTHER): Payer: 59 | Admitting: Nurse Practitioner

## 2022-07-26 ENCOUNTER — Encounter: Payer: Self-pay | Admitting: Nurse Practitioner

## 2022-07-26 ENCOUNTER — Encounter: Payer: Self-pay | Admitting: Pulmonary Disease

## 2022-07-26 VITALS — BP 124/78 | HR 88 | Temp 97.6°F | Ht 63.0 in | Wt 144.6 lb

## 2022-07-26 DIAGNOSIS — J449 Chronic obstructive pulmonary disease, unspecified: Secondary | ICD-10-CM

## 2022-07-26 DIAGNOSIS — I2721 Secondary pulmonary arterial hypertension: Secondary | ICD-10-CM

## 2022-07-26 DIAGNOSIS — R911 Solitary pulmonary nodule: Secondary | ICD-10-CM | POA: Diagnosis not present

## 2022-07-26 NOTE — Patient Instructions (Signed)
Continue Albuterol inhaler 2 puffs every 6 hours as needed for shortness of breath or wheezing. Notify if symptoms persist despite rescue inhaler/neb use.  Continue Trelegy 1 puff daily. Brush tongue and rinse mouth afterwards Continue montelukast (singulair) 1 tab At bedtime  I have reached out to the lung cancer screening program to get you scheduled for your low dose CT chest, if insurance will still cover it. Some insurance companies only cover until age 30 while others cover up until age 13. Someone will contact you regarding this  Follow up in 4 months with Dr. Jayme Cloud. If symptoms worsen, please contact office for sooner follow up or seek emergency care.

## 2022-07-26 NOTE — Assessment & Plan Note (Signed)
Compensated on current regimen. No recent exacerbations/hospitalizations. Encouraged to work on graded exercises. Smoking cessation advised. Action plan in place.  Patient Instructions  Continue Albuterol inhaler 2 puffs every 6 hours as needed for shortness of breath or wheezing. Notify if symptoms persist despite rescue inhaler/neb use.  Continue Trelegy 1 puff daily. Brush tongue and rinse mouth afterwards Continue montelukast (singulair) 1 tab At bedtime  I have reached out to the lung cancer screening program to get you scheduled for your low dose CT chest, if insurance will still cover it. Some insurance companies only cover until age 95 while others cover up until age 41. Someone will contact you regarding this  Follow up in 4 months with Dr. Jayme Cloud. If symptoms worsen, please contact office for sooner follow up or seek emergency care.

## 2022-07-26 NOTE — Assessment & Plan Note (Signed)
Resolved on previous imaging. She has aged out of the lung cancer screening program. Will repeat imaging as indicated.

## 2022-07-26 NOTE — Assessment & Plan Note (Signed)
-  Euvolemic on exam 

## 2022-07-26 NOTE — Progress Notes (Signed)
@Patient  ID: Beth [name], female    DOB: 1945-05-12, 77 y.o.   MRN: [id number]  Chief Complaint  Patient presents with   Follow-up    DOE. No wheezing. No cough.     Referring provider: Saralyn Pilar *  HPI: 77 year old female, active smoker followed for moderate COPD, pulmonary hypertension. She is a patient of Dr. Jayme Cloud and last seen in office 01/01/2022. Past medical history significant for HTN, migraines, CHF, mitral regurgitation, AS, allergies, lung nodule, IBS, GERD, HLD, fibromyalgia, depression, anxiety, chronic pain.   TEST/EVENTS:  09/09/2019 PFT: FVC 66, FEV1 58, ratio 60, TLC 80, DLCOunc 39. Moderate obstruction with severe diffusion defect. No BD.  03/08/2021 LDCT chest: atherosclerosis. Moderate to severe emphysema with diffuse bronchial wall thickening. New 4 mm RUL nodule, absent. Small hiatal hernia. Simple 2.7 cm right cyst. Lung RADS 2  01/01/2022: OV with Dr. Jayme Cloud. Follow up for COPD. Continues to smoke. Elevated PASP on echo 04/2019. No significant desaturations with ambulatory oximetry. Seems to note dyspnea on exertion; however somewhat better than before. Erratic with night time oxygen use. Wears "most nights". Appears compensated.   07/26/2022: Today - follow up Patient presents today for follow up. She has been doing well from a respiratory standpoint since she was here last. Breathing is at her baseline. She does not have any trouble with ADLs or household chores. She uses her Trelegy daily and rescue 3 times a week, usually after being outside. She denies any cough, chest congestion, wheezing, leg swelling, orthopnea. Continues to smoke.   Allergies  Allergen Reactions   Bextra  [Valdecoxib]    Compazine [Prochlorperazine Edisylate]     Stroke-like symptoms   Lithium Carbonate     Leg weakness   Lyrica [Pregabalin]     Immunization History  Administered Date(s) Administered   Fluad Quad(high Dose 65+) 11/14/2020, 01/01/2022   Influenza  Inj Mdck Quad Pf 11/05/2017   Influenza Split 11/13/2006, 11/02/2008   Influenza, High Dose Seasonal PF 09/27/2014, 09/27/2014, 09/29/2015, 09/29/2015, 12/30/2016, 10/15/2018, 11/05/2019   Influenza, Seasonal, Injecte, Preservative Fre 11/12/2010   Influenza,inj,Quad PF,6+ Mos 10/08/2012   Influenza-Unspecified 04/08/2014   PFIZER(Purple Top)SARS-COV-2 Vaccination 03/14/2019, 04/09/2019, 11/05/2019   PPD Test 05/16/2016   Pneumococcal Conjugate-13 09/27/2014   Pneumococcal Polysaccharide-23 06/12/2010   Tdap 03/19/2007, 09/29/2015   Zoster Recombinant(Shingrix) 04/10/2018, 05/15/2021   Zoster, Live 04/16/2012    Past Medical History:  Diagnosis Date   Allergy    Anxiety    Asthma    Chronic pain syndrome    discharged from pain clinic, hx of narcotics seeking behavior   COPD (chronic obstructive pulmonary disease) (HCC)    CVA (cerebral infarction)    Depression    Fibromyalgia    Headache    Hyperlipidemia    Hypertension    IBS (irritable bowel syndrome)    Stroke (HCC)    Vitamin D deficiency     Tobacco History: Social History   Tobacco Use  Smoking Status Some Days   Packs/day: 0.25   Years: 57.00   Additional pack years: 0.00   Total pack years: 14.25   Types: Cigarettes   Last attempt to quit: 10/24/2020   Years since quitting: 1.7  Smokeless Tobacco Never  Tobacco Comments   16 cigarettes a week. Khj 07/26/2022   Ready to quit: Not Answered Counseling given: Not Answered Tobacco comments: 16 cigarettes a week. Khj 07/26/2022   Outpatient Medications Prior to Visit  Medication Sig Dispense Refill   acetaminophen (  TYLENOL) 500 MG tablet Take 500 mg by mouth 2 (two) times daily as needed.     albuterol (VENTOLIN HFA) 108 (90 Base) MCG/ACT inhaler Inhale 1-2 puffs into the lungs every 6 (six) hours as needed for wheezing or shortness of breath. 18 g 2   aspirin 81 MG chewable tablet Chew 81 mg by mouth daily.     atorvastatin (LIPITOR) 40 MG tablet TAKE 1  TABLET BY MOUTH EVERY DAY 90 tablet 3   FLUoxetine (PROZAC) 20 MG capsule TAKE 3 CAPSULES(60 MG) BY MOUTH DAILY 270 capsule 1   furosemide (LASIX) 20 MG tablet Take 2 tablets (40 mg total) by mouth daily. If swelling is less, can take only 1 pill. 180 tablet 3   gabapentin (NEURONTIN) 300 MG capsule Take 300 mg by mouth 3 (three) times daily. Take with the 600mg      gabapentin (NEURONTIN) 600 MG tablet TAKE 1 TABLET(600 MG) BY MOUTH THREE TIMES DAILY 270 tablet 1   ketoconazole (NIZORAL) 2 % cream Apply 1 Application topically 2 (two) times daily. 30 g 0   LORazepam (ATIVAN) 0.5 MG tablet Take 1 tablet (0.5 mg total) by mouth every other day. 15 tablet 1   Melatonin 3 MG CAPS Take 10 mg by mouth at bedtime as needed (sleep).      methocarbamol (ROBAXIN) 500 MG tablet Take 1 tablet (500 mg total) by mouth every 8 (eight) hours as needed for muscle spasms. 90 tablet 1   metoprolol succinate (TOPROL-XL) 25 MG 24 hr tablet TAKE 1/2 TABLET(12.5 MG) BY MOUTH DAILY 45 tablet 1   metroNIDAZOLE (FLAGYL) 500 MG tablet Take 1 tablet (500 mg total) by mouth 2 (two) times daily. Do not drink alcohol while taking this medicine. 14 tablet 0   montelukast (SINGULAIR) 10 MG tablet TAKE 1 TABLET(10 MG) BY MOUTH AT BEDTIME 90 tablet 1   nitrofurantoin, macrocrystal-monohydrate, (MACROBID) 100 MG capsule Take 1 capsule (100 mg total) by mouth 2 (two) times daily. 10 capsule 0   ondansetron (ZOFRAN-ODT) 4 MG disintegrating tablet Take 1 tablet (4 mg total) by mouth every 8 (eight) hours as needed for nausea or vomiting. 30 tablet 0   potassium chloride (KLOR-CON) 10 MEQ tablet TAKE 1 TABLET(10 MEQ) BY MOUTH DAILY 90 tablet 2   QUEtiapine (SEROQUEL) 200 MG tablet Take 1 tablet (200 mg total) by mouth at bedtime. Take total of 250 mg. Take along with 50 mg tab 90 tablet 0   QUEtiapine (SEROQUEL) 50 MG tablet Take 1 tablet (50 mg total) by mouth at bedtime. Total of 250 mg at night. Take along with 200 mg tab 90 tablet 0    TRELEGY ELLIPTA 200-62.5-25 MCG/ACT AEPB INHALE 1 PUFF INTO THE LUNGS DAILY 60 each 2   No facility-administered medications prior to visit.     Review of Systems:   Constitutional: No weight loss or gain, night sweats, fevers, chills, fatigue, or lassitude. HEENT: No headaches, difficulty swallowing, tooth/dental problems, or sore throat. No sneezing, itching, ear ache, nasal congestion, or post nasal drip CV:  No chest pain, orthopnea, PND, swelling in lower extremities, anasarca, dizziness, palpitations, syncope Resp: +shortness of breath with exertion. No excess mucus or change in color of mucus. No productive or non-productive. No hemoptysis. No wheezing.  No chest wall deformity GI:  No heartburn, indigestion Skin: No rash, lesions, ulcerations MSK:  No joint pain or swelling.   Neuro: No dizziness or lightheadedness.  Psych: No depression or anxiety. Mood stable.  Physical Exam:  BP 124/78 (BP Location: Left Arm, Cuff Size: Normal)   Pulse 88   Temp 97.6 F (36.4 C)   Ht 5\' 3"  (1.6 m)   Wt 144 lb 9.6 oz (65.6 kg)   SpO2 96%   BMI 25.61 kg/m   GEN: Pleasant, interactive, well-kempt; in no acute distress. HEENT:  Normocephalic and atraumatic. PERRLA. Sclera white. Nasal turbinates pink, moist and patent bilaterally. No rhinorrhea present. Oropharynx pink and moist, without exudate or edema. No lesions, ulcerations, or postnasal drip.  NECK:  Supple w/ fair ROM. No JVD present. Normal carotid impulses w/o bruits. Thyroid symmetrical with no goiter or nodules palpated. No lymphadenopathy.   CV: RRR, no m/r/g, no peripheral edema. Pulses intact, +2 bilaterally. No cyanosis, pallor or clubbing. PULMONARY:  Unlabored, regular breathing. Clear bilaterally A&P w/o wheezes/rales/rhonchi. No accessory muscle use.  GI: BS present and normoactive. Soft, non-tender to palpation. No organomegaly or masses detected.  MSK: No erythema, warmth or tenderness. Cap refil <2 sec all extrem.  No deformities or joint swelling noted.  Neuro: A/Ox3. No focal deficits noted.   Skin: Warm, no lesions or rashe Psych: Normal affect and behavior. Judgement and thought content appropriate.     Lab Results:  CBC    Component Value Date/Time   WBC 12.2 (H) 03/24/2022 0510   RBC 3.38 (L) 03/24/2022 0510   HGB 10.2 (L) 03/24/2022 0510   HGB 12.2 12/25/2012 1449   HCT 32.4 (L) 03/24/2022 0510   HCT 36.1 12/25/2012 1449   PLT 198 03/24/2022 0510   PLT 166 12/29/2012 0553   MCV 95.9 03/24/2022 0510   MCV 88 12/25/2012 1449   MCH 30.2 03/24/2022 0510   MCHC 31.5 03/24/2022 0510   RDW 15.5 03/24/2022 0510   RDW 15.6 (H) 12/25/2012 1449   LYMPHSABS 1.7 03/23/2022 2346   LYMPHSABS 2.6 12/25/2012 1449   MONOABS 1.0 03/23/2022 2346   MONOABS 0.4 12/25/2012 1449   EOSABS 0.3 03/23/2022 2346   EOSABS 0.1 12/25/2012 1449   BASOSABS 0.0 03/23/2022 2346   BASOSABS 0.1 12/25/2012 1449    BMET    Component Value Date/Time   NA 139 03/24/2022 0510   NA 143 01/01/2021 1526   NA 144 12/26/2012 0520   K 3.9 03/24/2022 0510   K 3.3 (L) 12/26/2012 0520   CL 104 03/24/2022 0510   CL 114 (H) 12/26/2012 0520   CO2 28 03/24/2022 0510   CO2 24 12/26/2012 0520   GLUCOSE 102 (H) 03/24/2022 0510   GLUCOSE 85 12/26/2012 0520   BUN 23 03/24/2022 0510   BUN 19 01/01/2021 1526   BUN 13 12/26/2012 0520   CREATININE 1.11 (H) 03/24/2022 0510   CREATININE 0.99 09/21/2021 1413   CALCIUM 8.6 (L) 03/24/2022 0510   CALCIUM 9.1 12/26/2012 0520   GFRNONAA 51 (L) 03/24/2022 0510   GFRNONAA 55 (L) 01/14/2020 1144   GFRAA 63 01/14/2020 1144    BNP    Component Value Date/Time   BNP 209.5 (H) 03/24/2022 0510     Imaging:  No results found.       Latest Ref Rng & Units 09/09/2019   12:35 PM  PFT Results  FVC-Predicted Pre % 66  P  FVC-Post L 1.88  P  FVC-Predicted Post % 63  P  Pre FEV1/FVC % % 66  P  Post FEV1/FCV % % 60  P  FEV1-Pre L 1.28  P  FEV1-Predicted Pre % 58  P   FEV1-Post L 1.13  P  DLCO uncorrected ml/min/mmHg 7.90  P  DLCO UNC% % 39  P  DLVA Predicted % 50  P  TLC L 4.20  P  TLC % Predicted % 80  P  RV % Predicted % 109  P    P Preliminary result    No results found for: "NITRICOXIDE"      Assessment & Plan:   COPD, severe Compensated on current regimen. No recent exacerbations/hospitalizations. Encouraged to work on graded exercises. Smoking cessation advised. Action plan in place.  Patient Instructions  Continue Albuterol inhaler 2 puffs every 6 hours as needed for shortness of breath or wheezing. Notify if symptoms persist despite rescue inhaler/neb use.  Continue Trelegy 1 puff daily. Brush tongue and rinse mouth afterwards Continue montelukast (singulair) 1 tab At bedtime  I have reached out to the lung cancer screening program to get you scheduled for your low dose CT chest, if insurance will still cover it. Some insurance companies only cover until age 68 while others cover up until age 76. Someone will contact you regarding this  Follow up in 4 months with Dr. Jayme Cloud. If symptoms worsen, please contact office for sooner follow up or seek emergency care.    Lung nodule Resolved on previous imaging. She has aged out of the lung cancer screening program. Will repeat imaging as indicated.   Severe pulmonary arterial systolic hypertension (HCC) Euvolemic on exam.   I spent 28 minutes of dedicated to the care of this patient on the date of this encounter to include pre-visit review of records, face-to-face time with the patient discussing conditions above, post visit ordering of testing, clinical documentation with the electronic health record, making appropriate referrals as documented, and communicating necessary findings to members of the patients care team.  Noemi Chapel, NP 07/26/2022  Pt aware and understands NP's role.

## 2022-07-27 NOTE — Progress Notes (Signed)
Agree with the details of the visit as noted by Katherine Cobb, NP. ° °C. Laura Allyn Bertoni, MD °Advanced Bronchoscopy °PCCM [facility] ° °

## 2022-07-28 DIAGNOSIS — J441 Chronic obstructive pulmonary disease with (acute) exacerbation: Secondary | ICD-10-CM | POA: Diagnosis not present

## 2022-07-29 ENCOUNTER — Ambulatory Visit: Payer: 59 | Admitting: Internal Medicine

## 2022-07-29 LAB — CERVICOVAGINAL ANCILLARY ONLY
Bacterial Vaginitis (gardnerella): POSITIVE — AB
Candida Glabrata: NEGATIVE
Candida Vaginitis: NEGATIVE
Comment: NEGATIVE
Comment: NEGATIVE
Comment: NEGATIVE

## 2022-07-30 MED ORDER — METRONIDAZOLE 500 MG PO TABS: 500.0000 mg | ORAL_TABLET | Freq: Two times a day (BID) | ORAL | 0 refills | Status: DC

## 2022-07-30 NOTE — Addendum Note (Signed)
Addended by: Lorre Munroe on: 07/30/2022 07:45 AM   Modules accepted: Orders

## 2022-08-07 ENCOUNTER — Ambulatory Visit: Payer: Self-pay | Admitting: *Deleted

## 2022-08-07 NOTE — Telephone Encounter (Signed)
Going to route this call to Nicki Reaper, FNP who saw her on 07/25/22 for pelvic symptoms.  Typically with fall / traumatic injuries requiring X-ray we usually will see patients in office for that and order x-ray after evaluation, or before eval if we have an apt scheduled.  I agree that Urgent Care for injury requiring X-ray is usually recommended if cannot get in sooner or symptoms are more severe.  Saralyn Pilar, DO [facility] 08/07/2022, 12:08 PM

## 2022-08-07 NOTE — Telephone Encounter (Signed)
She has not mentioned vaginal bleeding to me. She can schedule an appt for further evaluation with me.

## 2022-08-07 NOTE — Telephone Encounter (Signed)
Chief Complaint: vaginal bleeding, hx post menopausal, fell couple of weeks ago . Requesting refill medication  Symptoms: c/o continued vaginal bleeding noted in underwear. Completed course of flagyl Monday and reports sx continued with pain with urination. Takes ASA. Larey Seat couple of weeks ago and pain in low back and hips. Requesting x ray  Frequency: on going since Monday  Pertinent Negatives: Patient denies fever. No dizziness no lightheadedness no headaches Disposition: [] ED /[] Urgent Care (no appt availability in office) / [] Appointment(In office/virtual)/ []  [facility]/ [] Home Care/ [x] Refused Recommended Disposition /[]  Mobile Bus/ []  Follow-up with PCP Additional Notes:   No available appt until July 26. Recommended UC . Patient declined and would like to get more medication and get xray ordered for low back. Please advise. Patient would like a call back.     Reason for Disposition  MODERATE vaginal bleeding (e.g., soaking 1 pad or tampon per hour and present > 6 hours; 1 menstrual cup every 6 hours)  Answer Assessment - Initial Assessment Questions 1. AMOUNT: "Describe the bleeding that you are having."    - SPOTTING: spotting, or pinkish / brownish mucous discharge; does not fill panty liner or pad    - MILD:  less than 1 pad / hour; less than patient's usual menstrual bleeding   - MODERATE: 1-2 pads / hour; 1 menstrual cup every 6 hours; small-medium blood clots (e.g., pea, grape, small coin)   - SEVERE: soaking 2 or more pads/hour for 2 or more hours; 1 menstrual cup every 2 hours; bleeding not contained by pads or continuous red blood from vagina; large blood clots (e.g., golf ball, large coin)      Vaginal bleeding continues since completing course flagyl on Monday  2. ONSET: "When did the bleeding begin?" "Is it continuing now?"     On going has been treated  3. MENSTRUAL PERIOD: "When was the last normal menstrual period?" "How is this different than  your period?"     Na post menopausal 4. REGULARITY: "How regular are your periods?"     Na  5. ABDOMEN PAIN: "Do you have any pain?" "How bad is the pain?"  (e.g., Scale 1-10; mild, moderate, or severe)   - MILD (1-3): doesn't interfere with normal activities, abdomen soft and not tender to touch    - MODERATE (4-7): interferes with normal activities or awakens from sleep, abdomen tender to touch    - SEVERE (8-10): excruciating pain, doubled over, unable to do any normal activities      Pain with urinating  6. PREGNANCY: "Is there any chance you are pregnant?" "When was your last menstrual period?"     Na  7. BREASTFEEDING: "Are you breastfeeding?"     Na  8. HORMONE MEDICINES: "Are you taking any hormone medicines, prescription or over-the-counter?" (e.g., birth control pills, estrogen)     na 9. BLOOD THINNER MEDICINES: "Do you take any blood thinners?" (e.g., Coumadin / warfarin, Pradaxa / dabigatran, aspirin)     aspirin 10. CAUSE: "What do you think is causing the bleeding?" (e.g., recent gyn surgery, recent gyn procedure; known bleeding disorder, cervical cancer, polycystic ovarian disease, fibroids)         Not sure  11. HEMODYNAMIC STATUS: "Are you weak or feeling lightheaded?" If Yes, ask: "Can you stand and walk normally?"        Denies lightheaded. Reports falling couple of weeks ago, now low back pain and hip pain  12. OTHER SYMPTOMS: "What other symptoms  are you having with the bleeding?" (e.g., passed tissue, vaginal discharge, fever, menstrual-type cramps)       Vaginal bleeding  Protocols used: Vaginal Bleeding - Abnormal-A-AH

## 2022-08-08 NOTE — Telephone Encounter (Signed)
Tried calling no answer.  PEC please advise and schedule an appointment with Rene Kocher.   Thanks,   -Vernona Rieger

## 2022-08-09 NOTE — Telephone Encounter (Signed)
Apt 07/15 with Rene Kocher.   Thanks,   -Vernona Rieger

## 2022-08-12 ENCOUNTER — Encounter: Payer: Self-pay | Admitting: Internal Medicine

## 2022-08-12 ENCOUNTER — Ambulatory Visit (INDEPENDENT_AMBULATORY_CARE_PROVIDER_SITE_OTHER): Payer: 59 | Admitting: Internal Medicine

## 2022-08-12 ENCOUNTER — Other Ambulatory Visit (HOSPITAL_COMMUNITY)
Admission: RE | Admit: 2022-08-12 | Discharge: 2022-08-12 | Disposition: A | Discharge status: Home / Self Care | Payer: 59 | Source: Ambulatory Visit | Attending: Internal Medicine | Admitting: Internal Medicine

## 2022-08-12 ENCOUNTER — Ambulatory Visit
Admission: RE | Admit: 2022-08-12 | Discharge: 2022-08-12 | Disposition: A | Discharge status: Home / Self Care | Payer: 59 | Attending: Internal Medicine | Admitting: Internal Medicine

## 2022-08-12 ENCOUNTER — Telehealth: Payer: 59

## 2022-08-12 ENCOUNTER — Telehealth: Payer: Self-pay | Admitting: Pharmacist

## 2022-08-12 ENCOUNTER — Ambulatory Visit
Admission: RE | Admit: 2022-08-12 | Discharge: 2022-08-12 | Disposition: A | Discharge status: Home / Self Care | Payer: 59 | Source: Ambulatory Visit | Attending: Internal Medicine | Admitting: Internal Medicine

## 2022-08-12 VITALS — BP 124/68 | HR 60 | Temp 96.7°F | Wt 142.0 lb

## 2022-08-12 DIAGNOSIS — W19XXXA Unspecified fall, initial encounter: Secondary | ICD-10-CM | POA: Diagnosis not present

## 2022-08-12 DIAGNOSIS — M545 Low back pain, unspecified: Secondary | ICD-10-CM | POA: Insufficient documentation

## 2022-08-12 DIAGNOSIS — N939 Abnormal uterine and vaginal bleeding, unspecified: Secondary | ICD-10-CM | POA: Insufficient documentation

## 2022-08-12 DIAGNOSIS — Z043 Encounter for examination and observation following other accident: Secondary | ICD-10-CM | POA: Diagnosis not present

## 2022-08-12 DIAGNOSIS — M5136 Other intervertebral disc degeneration, lumbar region: Secondary | ICD-10-CM | POA: Insufficient documentation

## 2022-08-12 DIAGNOSIS — M4854XA Collapsed vertebra, not elsewhere classified, thoracic region, initial encounter for fracture: Secondary | ICD-10-CM | POA: Diagnosis not present

## 2022-08-12 DIAGNOSIS — N760 Acute vaginitis: Secondary | ICD-10-CM | POA: Diagnosis not present

## 2022-08-12 DIAGNOSIS — B9689 Other specified bacterial agents as the cause of diseases classified elsewhere: Secondary | ICD-10-CM

## 2022-08-12 DIAGNOSIS — M5126 Other intervertebral disc displacement, lumbar region: Secondary | ICD-10-CM | POA: Diagnosis not present

## 2022-08-12 MED ORDER — TIZANIDINE HCL 2 MG PO CAPS: 2.0000 mg | ORAL_CAPSULE | Freq: Three times a day (TID) | ORAL | 0 refills | Status: DC

## 2022-08-12 NOTE — Patient Instructions (Signed)
Bacterial Vaginosis  Bacterial vaginosis is an infection of the vagina. It happens when too many normal germs (healthy bacteria) grow in the vagina. This infection can make it easier to get other infections from sex (STIs). It is very important for pregnant women to get treated. This infection can cause babies to be born early or at a low birth weight. What are the causes? This infection is caused by an increase in certain germs that grow in the vagina. You cannot get this infection from toilet seats, bedsheets, swimming pools, or things that touch your vagina. What increases the risk? Having sex with a new person or more than one person. Having sex without protection. Douching. Having an intrauterine device (IUD). Smoking. Using drugs or drinking alcohol. These can lead you to do things that are risky. Taking certain antibiotic medicines. Being pregnant. What are the signs or symptoms? Some women have no symptoms. Symptoms may include: A discharge from your vagina. It may be gray or white. It can be watery or foamy. A fishy smell. This can happen after sex or during your menstrual period. Itching in and around your vagina. A feeling of burning or pain when you pee (urinate). How is this treated? This infection is treated with antibiotic medicines. These may be given to you as: A pill. A cream for your vagina. A medicine that you put into your vagina (suppository). If the infection comes back after treatment, you may need more antibiotics. Follow these instructions at home: Medicines Take over-the-counter and prescription medicines as told by your doctor. Take or use your antibiotic medicine as told by your doctor. Do not stop taking or using it, even if you start to feel better. General instructions If the person you have sex with is a woman, tell her that you have this infection. She will need to follow up with her doctor. If you have a female partner, he does not need to be  treated. Do not have sex until you finish treatment. Drink enough fluid to keep your pee pale yellow. Keep your vagina and butt clean. Wash the area with warm water each day. Wipe from front to back after you use the toilet. If you are breastfeeding a baby, ask your doctor if you should keep doing so during treatment. Keep all follow-up visits. How is this prevented? Self-care Do not douche. Use only warm water to wash around your vagina. Wear underwear that is cotton or lined with cotton. Do not wear tight pants and pantyhose, especially in the summer. Safe sex Use protection when you have sex. This includes: Use condoms. Use dental dams. This is a thin layer that protects the mouth during oral sex. Limit how many people you have sex with. To prevent this infection, it is best to have sex with just one person. Get tested for STIs. The person you have sex with should also get tested. Drugs and alcohol Do not smoke or use any products that contain nicotine or tobacco. If you need help quitting, ask your doctor. Do not use drugs. Do not drink alcohol if: Your doctor tells you not to drink. You are pregnant, may be pregnant, or are planning to become pregnant. If you drink alcohol: Limit how much you have to 0-1 drink a day. Know how much alcohol is in your drink. In the [place] one drink equals one 12 oz bottle of beer (355 mL), one 5 oz glass of wine (148 mL), or one 1 oz glass of hard liquor (44   mL). Where to find more information Centers for Disease Control and Prevention: www.cdc.gov American Sexual Health Association: www.ashastd.org Office on Women's Health: www.womenshealth.gov Contact a doctor if: Your symptoms do not get better, even after you are treated. You have more discharge or pain when you pee. You have a fever or chills. You have pain in your belly (abdomen) or in the area between your hips. You have pain with sex. You bleed from your vagina between menstrual  periods. Summary This infection can happen when too many germs (bacteria) grow in the vagina. This infection can make it easier to get infections from sex (STIs). Treating this can lower that chance. Get treated if you are pregnant. This infection can cause babies to be born early. Do not stop taking or using your antibiotic medicine, even if you start to feel better. This information is not intended to replace advice given to you by your health care provider. Make sure you discuss any questions you have with your health care provider. Document Revised: 07/15/2019 Document Reviewed: 07/15/2019 Elsevier Patient Education  2024 [employer]  

## 2022-08-12 NOTE — Progress Notes (Signed)
Subjective:    Patient ID: Beth [name], female    DOB: 1946-01-19, 77 y.o.   MRN: [id number]  HPI  Patient presents to clinic today with complaint of vaginal spotting.  She reports this started 2 weeks ago but has not occurred today.  She noticed it only when she wiped.  She denies urinary symptoms.  She is not sexually active.  She has had a bilateral oophorectomy and hysterectomy.  She has recently been treated for BV x 2 in the last 3 months, most recently on 07/25/2022.  She also reports left-sided low back pain.  She describes this pain as sharp and stabbing.  The pain does not radiate but is worse with movement.  She reports this started 2 weeks ago after a fall.  She reports she was walking in a parking lot and fell on her buttocks.  She has a history of chronic pain syndrome, chronic joint and muscle pain due to fibromyalgia and osteoporosis.  She has tried tylenol, ibuprofen OTC with minimal relief of symptoms.  Review of Systems     Past Medical History:  Diagnosis Date   Allergy    Anxiety    Asthma    Chronic pain syndrome    discharged from pain clinic, hx of narcotics seeking behavior   COPD (chronic obstructive pulmonary disease) (HCC)    CVA (cerebral infarction)    Depression    Fibromyalgia    Headache    Hyperlipidemia    Hypertension    IBS (irritable bowel syndrome)    Stroke (HCC)    Vitamin D deficiency     Current Outpatient Medications  Medication Sig Dispense Refill   acetaminophen (TYLENOL) 500 MG tablet Take 500 mg by mouth 2 (two) times daily as needed.     albuterol (VENTOLIN HFA) 108 (90 Base) MCG/ACT inhaler Inhale 1-2 puffs into the lungs every 6 (six) hours as needed for wheezing or shortness of breath. 18 g 2   aspirin 81 MG chewable tablet Chew 81 mg by mouth daily.     atorvastatin (LIPITOR) 40 MG tablet TAKE 1 TABLET BY MOUTH EVERY DAY 90 tablet 3   FLUoxetine (PROZAC) 20 MG capsule TAKE 3 CAPSULES(60 MG) BY MOUTH DAILY 270 capsule 1    furosemide (LASIX) 20 MG tablet Take 2 tablets (40 mg total) by mouth daily. If swelling is less, can take only 1 pill. 180 tablet 3   gabapentin (NEURONTIN) 300 MG capsule Take 300 mg by mouth 3 (three) times daily. Take with the 600mg      gabapentin (NEURONTIN) 600 MG tablet TAKE 1 TABLET(600 MG) BY MOUTH THREE TIMES DAILY 270 tablet 1   ketoconazole (NIZORAL) 2 % cream Apply 1 Application topically 2 (two) times daily. 30 g 0   Melatonin 3 MG CAPS Take 10 mg by mouth at bedtime as needed (sleep).      methocarbamol (ROBAXIN) 500 MG tablet Take 1 tablet (500 mg total) by mouth every 8 (eight) hours as needed for muscle spasms. 90 tablet 1   metoprolol succinate (TOPROL-XL) 25 MG 24 hr tablet TAKE 1/2 TABLET(12.5 MG) BY MOUTH DAILY 45 tablet 1   metroNIDAZOLE (FLAGYL) 500 MG tablet Take 1 tablet (500 mg total) by mouth 2 (two) times daily. Do not drink alcohol while taking this medicine. 14 tablet 0   montelukast (SINGULAIR) 10 MG tablet TAKE 1 TABLET(10 MG) BY MOUTH AT BEDTIME 90 tablet 1   nitrofurantoin, macrocrystal-monohydrate, (MACROBID) 100 MG capsule Take 1 capsule (  100 mg total) by mouth 2 (two) times daily. 10 capsule 0   ondansetron (ZOFRAN-ODT) 4 MG disintegrating tablet Take 1 tablet (4 mg total) by mouth every 8 (eight) hours as needed for nausea or vomiting. 30 tablet 0   potassium chloride (KLOR-CON) 10 MEQ tablet TAKE 1 TABLET(10 MEQ) BY MOUTH DAILY 90 tablet 2   QUEtiapine (SEROQUEL) 200 MG tablet Take 1 tablet (200 mg total) by mouth at bedtime. Take total of 250 mg. Take along with 50 mg tab 90 tablet 0   QUEtiapine (SEROQUEL) 50 MG tablet Take 1 tablet (50 mg total) by mouth at bedtime. Total of 250 mg at night. Take along with 200 mg tab 90 tablet 0   TRELEGY ELLIPTA 200-62.5-25 MCG/ACT AEPB INHALE 1 PUFF INTO THE LUNGS DAILY 60 each 2   No current facility-administered medications for this visit.    Allergies  Allergen Reactions   Bextra  [Valdecoxib]    Compazine  [Prochlorperazine Edisylate]     Stroke-like symptoms   Lithium Carbonate     Leg weakness   Lyrica [Pregabalin]     Family History  Problem Relation Age of Onset   Anxiety disorder Mother    Depression Mother    Breast cancer Mother 45   Cancer Father    Gallbladder disease Father    Alcohol abuse Father    Depression Father    Bipolar disorder Son     Social History   Socioeconomic History   Marital status: Legally Separated    Spouse name: Not on file   Number of children: 2   Years of education: Not on file   Highest education level: Not on file  Occupational History   Occupation: retired  Tobacco Use   Smoking status: Some Days    Current packs/day: 0.00    Average packs/day: 0.3 packs/day for 57.0 years (14.3 ttl pk-yrs)    Types: Cigarettes    Start date: 10/25/1963    Last attempt to quit: 10/24/2020    Years since quitting: 1.8   Smokeless tobacco: Never   Tobacco comments:    16 cigarettes a week. Khj 07/26/2022  Vaping Use   Vaping status: Never Used  Substance and Sexual Activity   Alcohol use: No    Alcohol/week: 0.0 standard drinks of alcohol   Drug use: No   Sexual activity: Not Currently  Other Topics Concern   Not on file  Social History Narrative   Not on file   Social Determinants of Health   Financial Resource Strain: Low Risk  (02/01/2022)   Overall Financial Resource Strain (CARDIA)    Difficulty of Paying Living Expenses: Not hard at all  Food Insecurity: No Food Insecurity (02/01/2022)   Hunger Vital Sign    Worried About Running Out of Food in the Last Year: Never true    Ran Out of Food in the Last Year: Never true  Transportation Needs: No Transportation Needs (02/01/2022)   PRAPARE - Administrator, Civil Service (Medical): No    Lack of Transportation (Non-Medical): No  Physical Activity: Insufficiently Active (02/01/2022)   Exercise Vital Sign    Days of Exercise per Week: 7 days    Minutes of Exercise per Session: 20  min  Stress: No Stress Concern Present (02/01/2022)   [employer] of Occupational Health - Occupational Stress Questionnaire    Feeling of Stress : Only a little  Social Connections: Socially Isolated (02/01/2022)   Social Connection and Isolation Panel [  NHANES]    Frequency of Communication with Friends and Family: Twice a week    Frequency of Social Gatherings with Friends and Family: Once a week    Attends Religious Services: Never    Database administrator or Organizations: No    Attends Banker Meetings: Never    Marital Status: Separated  Intimate Partner Violence: Not At Risk (02/01/2022)   Humiliation, Afraid, Rape, and Kick questionnaire    Fear of Current or Ex-Partner: No    Emotionally Abused: No    Physically Abused: No    Sexually Abused: No     Constitutional: Denies fever, malaise, fatigue, headache or abrupt weight changes.  Respiratory: Denies difficulty breathing, shortness of breath, cough or sputum production.   Cardiovascular: Denies chest pain, chest tightness, palpitations or swelling in the hands or feet.  Gastrointestinal: Denies abdominal pain, bloating, constipation, diarrhea or blood in the stool.  GU: Patient reports vaginal spotting.  Denies urgency, frequency, pain with urination, burning sensation, blood in urine, odor or discharge. Musculoskeletal: Patient reports worsening left-sided low back pain due to fall, chronic joint and muscle pain.  Denies decrease in range of motion, difficulty with gait, or joint swelling.  Skin: Denies redness, rashes, lesions or ulcercations.  Neurological: Patient reports difficulty with balance at times.  Denies dizziness, difficulty with memory, difficulty with speech or problems with coordination.    No other specific complaints in a complete review of systems (except as listed in HPI above).  Objective:   Physical Exam   BP 124/68 (BP Location: Left Arm, Patient Position: Sitting, Cuff Size:  Normal)   Pulse 60   Temp (!) 96.7 F (35.9 C) (Temporal)   Wt 142 lb (64.4 kg)   BMI 25.15 kg/m   Wt Readings from Last 3 Encounters:  07/26/22 144 lb 9.6 oz (65.6 kg)  07/25/22 144 lb (65.3 kg)  06/25/22 144 lb 12.8 oz (65.7 kg)    General: Appears her stated age, overweight, in NAD. Skin: Warm, dry and intact.  No bruising or abrasions noted. Cardiovascular: Normal rate and rhythm.  Pulmonary/Chest: Normal effort and positive vesicular breath sounds. No respiratory distress. No wheezes, rales or ronchi noted.  Abdomen: No CVA tenderness noted Pelvic: Normal female anatomy.  No external rash or lesion noted.  No vaginal discharge or odor present.  She did have pain with insertion of the swab for the wet prep. Musculoskeletal: Decreased flexion and extension of the spine due to pain.  Normal rotation.  No bony tenderness noted over the thoracic or lumbar spine.  Pain with palpation of the left paralumbar muscles.  Strength decreased but equal in her bilateral lower extremities.  Gait slow, slightly unsteady without device. Neurological: Alert and oriented.    BMET    Component Value Date/Time   NA 139 03/24/2022 0510   NA 143 01/01/2021 1526   NA 144 12/26/2012 0520   K 3.9 03/24/2022 0510   K 3.3 (L) 12/26/2012 0520   CL 104 03/24/2022 0510   CL 114 (H) 12/26/2012 0520   CO2 28 03/24/2022 0510   CO2 24 12/26/2012 0520   GLUCOSE 102 (H) 03/24/2022 0510   GLUCOSE 85 12/26/2012 0520   BUN 23 03/24/2022 0510   BUN 19 01/01/2021 1526   BUN 13 12/26/2012 0520   CREATININE 1.11 (H) 03/24/2022 0510   CREATININE 0.99 09/21/2021 1413   CALCIUM 8.6 (L) 03/24/2022 0510   CALCIUM 9.1 12/26/2012 0520   GFRNONAA 51 (L) 03/24/2022  0510   GFRNONAA 55 (L) 01/14/2020 1144   GFRAA 63 01/14/2020 1144    Lipid Panel     Component Value Date/Time   CHOL 133 03/24/2022 0510   CHOL 118 03/23/2015 1536   CHOL 185 12/25/2012 1449   TRIG 40 03/24/2022 0510   TRIG 93 12/25/2012 1449    HDL 52 03/24/2022 0510   HDL 44 03/23/2015 1536   HDL 39 (L) 12/25/2012 1449   CHOLHDL 2.6 03/24/2022 0510   VLDL 8 03/24/2022 0510   VLDL 19 12/25/2012 1449   LDLCALC 73 03/24/2022 0510   LDLCALC 64 04/10/2018 1115   LDLCALC 127 (H) 12/25/2012 1449    CBC    Component Value Date/Time   WBC 12.2 (H) 03/24/2022 0510   RBC 3.38 (L) 03/24/2022 0510   HGB 10.2 (L) 03/24/2022 0510   HGB 12.2 12/25/2012 1449   HCT 32.4 (L) 03/24/2022 0510   HCT 36.1 12/25/2012 1449   PLT 198 03/24/2022 0510   PLT 166 12/29/2012 0553   MCV 95.9 03/24/2022 0510   MCV 88 12/25/2012 1449   MCH 30.2 03/24/2022 0510   MCHC 31.5 03/24/2022 0510   RDW 15.5 03/24/2022 0510   RDW 15.6 (H) 12/25/2012 1449   LYMPHSABS 1.7 03/23/2022 2346   LYMPHSABS 2.6 12/25/2012 1449   MONOABS 1.0 03/23/2022 2346   MONOABS 0.4 12/25/2012 1449   EOSABS 0.3 03/23/2022 2346   EOSABS 0.1 12/25/2012 1449   BASOSABS 0.0 03/23/2022 2346   BASOSABS 0.1 12/25/2012 1449    Hgb A1C Lab Results  Component Value Date   HGBA1C 5.9 (H) 03/24/2022           Assessment & Plan:   Vaginal spotting:  Will check wet prep for BV and yeast She has been treated with Flagyl twice within the last 2 months Advised her if her wet prep shows BV again, would recommend referral to GYN for further evaluation of her symptoms Recommend boric acid suppositories daily to neutralize pH  Acute on chronic left-sided low back pain without sciatica:  This seems muscular in origin however she is requesting an x-ray of the lumbar spine today.  I do not think this is unreasonable given her history of osteoporosis Continue Tylenol and ibuprofen OTC Rx for Zanaflex 2 mg 3 times daily as needed-Tatian caution given Encouraged heat and massage  Follow-up with your PCP as previously scheduled Nicki Reaper, NP

## 2022-08-12 NOTE — Telephone Encounter (Signed)
   Outreach Note  08/12/2022 Name: Beth [name] MRN: [id number] DOB: [birth year]  Referred by: Smitty Cords, DO Reason for referral : No chief complaint on file.   Was unable to reach patient via telephone today and have left HIPAA compliant voicemail asking patient to return my call.    Follow Up Plan: Will collaborate with Care Guide to outreach to schedule follow up with me  Estelle Grumbles, PharmD, [facility] Clinical Pharmacist [facility] [phone]

## 2022-08-14 ENCOUNTER — Telehealth: Payer: Self-pay

## 2022-08-14 ENCOUNTER — Ambulatory Visit (INDEPENDENT_AMBULATORY_CARE_PROVIDER_SITE_OTHER): Payer: 59

## 2022-08-14 ENCOUNTER — Other Ambulatory Visit: Payer: Self-pay | Admitting: Pulmonary Disease

## 2022-08-14 DIAGNOSIS — F411 Generalized anxiety disorder: Secondary | ICD-10-CM

## 2022-08-14 DIAGNOSIS — B9689 Other specified bacterial agents as the cause of diseases classified elsewhere: Secondary | ICD-10-CM

## 2022-08-14 DIAGNOSIS — N939 Abnormal uterine and vaginal bleeding, unspecified: Secondary | ICD-10-CM

## 2022-08-14 DIAGNOSIS — F332 Major depressive disorder, recurrent severe without psychotic features: Secondary | ICD-10-CM

## 2022-08-14 DIAGNOSIS — I5032 Chronic diastolic (congestive) heart failure: Secondary | ICD-10-CM

## 2022-08-14 LAB — CERVICOVAGINAL ANCILLARY ONLY
Bacterial Vaginitis (gardnerella): POSITIVE — AB
Candida Glabrata: NEGATIVE
Candida Vaginitis: NEGATIVE
Comment: NEGATIVE
Comment: NEGATIVE
Comment: NEGATIVE

## 2022-08-14 NOTE — Chronic Care Management (AMB) (Signed)
Chronic Care Management   CCM RN Visit Note  08/14/2022 Name: Beth [name] MRN: [id number] DOB: Dec 13, 1945  Subjective: Beth [name] is a 77 y.o. year old female who is a primary care patient of Smitty Cords, DO. The patient was referred to the Chronic Care Management team for assistance with care management needs subsequent to provider initiation of CCM services and plan of care.    Today's Visit:  Engaged with patient by telephone for follow up visit.        Goals Addressed             This Visit's Progress    CCM Expected Outcome:  Monitor, Self-Manage and Reduce Symptoms of  Depression and anxiety       Current Barriers:  Care Coordination needs related to ongoing support and education needs for mental health  in a patient with depression and anxiety Chronic Disease Management support and education needs related to effective management of depression  Lacks caregiver support.   Planned Interventions: Evaluation of current treatment plan related to depression and anxiety and patient's adherence to plan as established by provider. Had lost contact with the patient but the patient called the RNCM back today. She is having a lot of pelvic floor pain due to vaginitis. Saw the doctor recently. Review of the notes from the provider. Recommended that she take a probiotic like align or boric acid suppositories. A referral for GYN has been placed. Reviewed this with the patient and she verbalized understanding.  Advised patient to call the office for changes in mental health, mood, anxiety, and depression. The patient is thankful for the support of the counselor and feels she can be open and honest about her mental health and well being. Encouraged her to call her counselor or the [facility] for changes or new needs related to health and well being.   Provided education to patient re: keeping provider appointments, working with specialist for mental  health needs, and working with  the CCM team to effectively manage mental health and well being Reviewed medications with patient and discussed compliance. The patient has her medications and no issues with getting medications. States compliance with medications. Review of medications from pcp visit recently. Advised that these were over the counter products she could get. She states she is having a really hard time because of the pain. Reflective listening and support given.  Collaborated with pcp, and LCSW regarding effective management of depression and mental health needs  Discussed plans with patient for ongoing care management follow up and provided patient with direct contact information for care management team Advised patient to discuss changes in her mental health and well being  with provider Screening for signs and symptoms of depression related to chronic disease state  Assessed social determinant of health barriers The patient is working with home health in her home. She is hopeful this will help her regain her strength. She feels safe and states she is staying by herself. Review of needs and the patient declines any needs at this time. Encouraged the patient to call for changes or concerns. Review of fall prevention and safety. The patient is thankful to be home from the hospital and just wants to feel better from the UTI.  Education on what worse looks like and to call for changes or seek emergent help.   Symptom Management: Attend all scheduled provider appointments Perform IADL's (shopping, preparing meals, housekeeping, managing finances) independently Call provider office for new concerns or  questions  call the Suicide and Crisis Lifeline: 988 call the [place] National Suicide Prevention Lifeline: (786) 830-3057 or TTY: 671-184-8426 TTY 4343805427) to talk to a trained counselor call 1-800-273-TALK (toll free, 24 hour hotline) if experiencing a Mental Health or Behavioral Health Crisis   Follow Up Plan:  Telephone follow up appointment with care management team member scheduled for: 09-16-2022 at 230 pm       CCM Expected Outcome:  Monitor, Self-Manage and Reduce Symptoms of Heart Failure       Current Barriers:  Knowledge Deficits related to sx and sx management of HF Care Coordination needs related to ongoing support and education needs  in a patient with HF Chronic Disease Management support and education needs related to effective management of HF Lacks caregiver support.  Transportation barriers  Planned Interventions: Basic overview and discussion of pathophysiology of Heart Failure reviewed. The patient denies any issues with her HF or heart health. Biggest concern is the pain she is having from vaginitis. Education provided.  Provided education on low sodium diet. Review of monitoring dietary restrictions and making sure she stays adequately hydrated. The patient states she is eating and drinking and monitoring for changes. Reviewed Heart Failure Action Plan in depth Assessed need for readable accurate scales in home. The patient has a scales. Knows how to manage heart failure. Does have edema at times. Currently heart failure sx and sx are stable. Provided education about placing scale on hard, flat surface Advised patient to weigh each morning after emptying bladder Discussed importance of daily weight and advised patient to weigh and record daily Reviewed role of diuretics in prevention of fluid overload and management of heart failure Discussed the importance of keeping all appointments with provider. Review of calling the provider for changes, questions, or concerns. Provided patient with education about the role of exercise in the management of heart failure Advised patient to discuss changes in heart health with provider  Symptom Management: Take medications as prescribed   Attend all scheduled provider appointments Call pharmacy for medication refills 3-7 days in advance of  running out of medications Perform IADL's (shopping, preparing meals, housekeeping, managing finances) independently Call provider office for new concerns or questions  Work with the social worker to address care coordination needs and will continue to work with the clinical team to address health care and disease management related needs call the Suicide and Crisis Lifeline: 988 call the [place] National Suicide Prevention Lifeline: 517 029 7060 or TTY: (570) 868-6554 TTY 531 133 7655) to talk to a trained counselor call 1-800-273-TALK (toll free, 24 hour hotline) if experiencing a Mental Health or Behavioral Health Crisis  call office if I gain more than 2 pounds in one day or 5 pounds in one week keep legs up while sitting use salt in moderation watch for swelling in feet, ankles and legs every day weigh myself daily develop a rescue plan follow rescue plan if symptoms flare-up eat more whole grains, fruits and vegetables, lean meats and healthy fats track symptoms and what helps feel better or worse dress right for the weather, hot or cold  Follow Up Plan: Telephone follow up appointment with care management team member scheduled for: 09-16-2022 at 230 pm          Plan:Telephone follow up appointment with care management team member scheduled for:  09-16-2022 at 230 pm  Alto Denver RN, MSN, CCM RN Care Manager  Chronic Care Management Direct Number: [phone]

## 2022-08-14 NOTE — Telephone Encounter (Signed)
   CCM RN Visit Note   08-14-2022 Name: ALECE KOPPEL MRN: [id number]      DOB: [birth year]  Subjective: Beth [name] is a 77 y.o. year old female who is a primary care patient of Dr. Saralyn Pilar. The patient was referred to the Chronic Care Management team for assistance with care management needs subsequent to provider initiation of CCM services and plan of care.      An unsuccessful telephone outreach was attempted today to contact the patient about Chronic Care Management needs.    Plan:A HIPAA compliant phone message was left for the patient providing contact information and requesting a return call.  Alto Denver RN, MSN, CCM RN Care Manager  Chronic Care Management Direct Number: [phone]

## 2022-08-14 NOTE — Patient Instructions (Signed)
Please call the care guide team at [phone] if you need to cancel or reschedule your appointment.   If you are experiencing a Mental Health or Behavioral Health Crisis or need someone to talk to, please call the Suicide and Crisis Lifeline: 988 call the [place] National Suicide Prevention Lifeline: (872) 682-4451 or TTY: 534-692-6987 TTY 909-785-4827) to talk to a trained counselor call 1-800-273-TALK (toll free, 24 hour hotline)   Following is a copy of the CCM Program Consent:  CCM service includes personalized support from designated clinical staff supervised by the physician, including individualized plan of care and coordination with other care providers 24/7 contact phone numbers for assistance for urgent and routine care needs. Service will only be billed when office clinical staff spend 20 minutes or more in a month to coordinate care. Only one practitioner may furnish and bill the service in a calendar month. The patient may stop CCM services at amy time (effective at the end of the month) by phone call to the office staff. The patient will be responsible for cost sharing (co-pay) or up to 20% of the service fee (after annual deductible is met)  Following is a copy of your full provider care plan:   Goals Addressed             This Visit's Progress    CCM Expected Outcome:  Monitor, Self-Manage and Reduce Symptoms of  Depression and anxiety       Current Barriers:  Care Coordination needs related to ongoing support and education needs for mental health  in a patient with depression and anxiety Chronic Disease Management support and education needs related to effective management of depression  Lacks caregiver support.   Planned Interventions: Evaluation of current treatment plan related to depression and anxiety and patient's adherence to plan as established by provider. Had lost contact with the patient but the patient called the RNCM back today. She is having a lot of pelvic  floor pain due to vaginitis. Saw the doctor recently. Review of the notes from the provider. Recommended that she take a probiotic like align or boric acid suppositories. A referral for GYN has been placed. Reviewed this with the patient and she verbalized understanding.  Advised patient to call the office for changes in mental health, mood, anxiety, and depression. The patient is thankful for the support of the counselor and feels she can be open and honest about her mental health and well being. Encouraged her to call her counselor or the [facility] for changes or new needs related to health and well being.   Provided education to patient re: keeping provider appointments, working with specialist for mental  health needs, and working with the CCM team to effectively manage mental health and well being Reviewed medications with patient and discussed compliance. The patient has her medications and no issues with getting medications. States compliance with medications. Review of medications from pcp visit recently. Advised that these were over the counter products she could get. She states she is having a really hard time because of the pain. Reflective listening and support given.  Collaborated with pcp, and LCSW regarding effective management of depression and mental health needs  Discussed plans with patient for ongoing care management follow up and provided patient with direct contact information for care management team Advised patient to discuss changes in her mental health and well being  with provider Screening for signs and symptoms of depression related to chronic disease state  Assessed social determinant of health  barriers The patient is working with home health in her home. She is hopeful this will help her regain her strength. She feels safe and states she is staying by herself. Review of needs and the patient declines any needs at this time. Encouraged the patient to call for changes or concerns.  Review of fall prevention and safety. The patient is thankful to be home from the hospital and just wants to feel better from the UTI.  Education on what worse looks like and to call for changes or seek emergent help.   Symptom Management: Attend all scheduled provider appointments Perform IADL's (shopping, preparing meals, housekeeping, managing finances) independently Call provider office for new concerns or questions  call the Suicide and Crisis Lifeline: 988 call the [place] National Suicide Prevention Lifeline: 972-738-7266 or TTY: 3642729488 TTY 216-787-1769) to talk to a trained counselor call 1-800-273-TALK (toll free, 24 hour hotline) if experiencing a Mental Health or Behavioral Health Crisis   Follow Up Plan: Telephone follow up appointment with care management team member scheduled for: 09-16-2022 at 230 pm       CCM Expected Outcome:  Monitor, Self-Manage and Reduce Symptoms of Heart Failure       Current Barriers:  Knowledge Deficits related to sx and sx management of HF Care Coordination needs related to ongoing support and education needs  in a patient with HF Chronic Disease Management support and education needs related to effective management of HF Lacks caregiver support.  Transportation barriers  Planned Interventions: Basic overview and discussion of pathophysiology of Heart Failure reviewed. The patient denies any issues with her HF or heart health. Biggest concern is the pain she is having from vaginitis. Education provided.  Provided education on low sodium diet. Review of monitoring dietary restrictions and making sure she stays adequately hydrated. The patient states she is eating and drinking and monitoring for changes. Reviewed Heart Failure Action Plan in depth Assessed need for readable accurate scales in home. The patient has a scales. Knows how to manage heart failure. Does have edema at times. Currently heart failure sx and sx are stable. Provided  education about placing scale on hard, flat surface Advised patient to weigh each morning after emptying bladder Discussed importance of daily weight and advised patient to weigh and record daily Reviewed role of diuretics in prevention of fluid overload and management of heart failure Discussed the importance of keeping all appointments with provider. Review of calling the provider for changes, questions, or concerns. Provided patient with education about the role of exercise in the management of heart failure Advised patient to discuss changes in heart health with provider  Symptom Management: Take medications as prescribed   Attend all scheduled provider appointments Call pharmacy for medication refills 3-7 days in advance of running out of medications Perform IADL's (shopping, preparing meals, housekeeping, managing finances) independently Call provider office for new concerns or questions  Work with the social worker to address care coordination needs and will continue to work with the clinical team to address health care and disease management related needs call the Suicide and Crisis Lifeline: 988 call the [place] National Suicide Prevention Lifeline: 334-341-3695 or TTY: 970-338-7927 TTY (847)710-7921) to talk to a trained counselor call 1-800-273-TALK (toll free, 24 hour hotline) if experiencing a Mental Health or Behavioral Health Crisis  call office if I gain more than 2 pounds in one day or 5 pounds in one week keep legs up while sitting use salt in moderation watch for swelling in feet,  ankles and legs every day weigh myself daily develop a rescue plan follow rescue plan if symptoms flare-up eat more whole grains, fruits and vegetables, lean meats and healthy fats track symptoms and what helps feel better or worse dress right for the weather, hot or cold  Follow Up Plan: Telephone follow up appointment with care management team member scheduled for: 09-16-2022 at 230 pm           The patient verbalized understanding of instructions, educational materials, and care plan provided today and DECLINED offer to receive copy of patient instructions, educational materials, and care plan.  Telephone follow up appointment with care management team member scheduled for: 09-16-2022 at 230 pm

## 2022-08-14 NOTE — Addendum Note (Signed)
Addended by: Lorre Munroe on: 08/14/2022 12:58 PM   Modules accepted: Orders

## 2022-08-17 ENCOUNTER — Other Ambulatory Visit: Payer: Self-pay | Admitting: Family Medicine

## 2022-08-17 DIAGNOSIS — I10 Essential (primary) hypertension: Secondary | ICD-10-CM

## 2022-08-19 NOTE — Telephone Encounter (Signed)
Requested Prescriptions  Pending Prescriptions Disp Refills   metoprolol succinate (TOPROL-XL) 25 MG 24 hr tablet [Pharmacy Med Name: METOPROLOL ER SUCCINATE 25MG  TABS] 45 tablet 0    Sig: TAKE 1/2 TABLET(12.5 MG) BY MOUTH DAILY     Cardiovascular:  Beta Blockers Passed - 08/17/2022 10:02 AM      Passed - Last BP in normal range    BP Readings from Last 1 Encounters:  08/12/22 124/68         Passed - Last Heart Rate in normal range    Pulse Readings from Last 1 Encounters:  08/12/22 60         Passed - Valid encounter within last 6 months    Recent Outpatient Visits           1 week ago Vaginal spotting   [place] [facility], Salvadore Oxford, NP   3 weeks ago Vaginal itching   [facility] [place], Salvadore Oxford, NP   1 month ago Vaginal irritation   [place] [place], [place] W, NP   3 months ago Chronic right-sided low back pain with right-sided sciatica   [place] [facility] Smitty Cords, DO   4 months ago Pyelonephritis   [facility] [address], [place], [place]

## 2022-08-20 DIAGNOSIS — N3001 Acute cystitis with hematuria: Secondary | ICD-10-CM | POA: Diagnosis not present

## 2022-08-20 DIAGNOSIS — R300 Dysuria: Secondary | ICD-10-CM | POA: Diagnosis not present

## 2022-08-25 ENCOUNTER — Other Ambulatory Visit: Payer: Self-pay

## 2022-08-25 ENCOUNTER — Emergency Department: Payer: 59

## 2022-08-25 ENCOUNTER — Inpatient Hospital Stay
Admission: EM | Admit: 2022-08-25 | Discharge: 2022-08-28 | DRG: 291 | Disposition: A | Discharge status: Home Health | Payer: 59 | Attending: Internal Medicine | Admitting: Internal Medicine

## 2022-08-25 DIAGNOSIS — R0989 Other specified symptoms and signs involving the circulatory and respiratory systems: Secondary | ICD-10-CM | POA: Diagnosis present

## 2022-08-25 DIAGNOSIS — I509 Heart failure, unspecified: Secondary | ICD-10-CM

## 2022-08-25 DIAGNOSIS — F32A Depression, unspecified: Secondary | ICD-10-CM | POA: Diagnosis present

## 2022-08-25 DIAGNOSIS — Z8673 Personal history of transient ischemic attack (TIA), and cerebral infarction without residual deficits: Secondary | ICD-10-CM

## 2022-08-25 DIAGNOSIS — I130 Hypertensive heart and chronic kidney disease with heart failure and stage 1 through stage 4 chronic kidney disease, or unspecified chronic kidney disease: Secondary | ICD-10-CM | POA: Diagnosis not present

## 2022-08-25 DIAGNOSIS — J4489 Other specified chronic obstructive pulmonary disease: Secondary | ICD-10-CM | POA: Diagnosis not present

## 2022-08-25 DIAGNOSIS — I2721 Secondary pulmonary arterial hypertension: Secondary | ICD-10-CM | POA: Diagnosis present

## 2022-08-25 DIAGNOSIS — M797 Fibromyalgia: Secondary | ICD-10-CM | POA: Diagnosis present

## 2022-08-25 DIAGNOSIS — G894 Chronic pain syndrome: Secondary | ICD-10-CM | POA: Diagnosis not present

## 2022-08-25 DIAGNOSIS — R9431 Abnormal electrocardiogram [ECG] [EKG]: Secondary | ICD-10-CM | POA: Diagnosis present

## 2022-08-25 DIAGNOSIS — N1831 Chronic kidney disease, stage 3a: Secondary | ICD-10-CM | POA: Diagnosis present

## 2022-08-25 DIAGNOSIS — Z888 Allergy status to other drugs, medicaments and biological substances status: Secondary | ICD-10-CM

## 2022-08-25 DIAGNOSIS — IMO0001 Reserved for inherently not codable concepts without codable children: Secondary | ICD-10-CM | POA: Diagnosis present

## 2022-08-25 DIAGNOSIS — N390 Urinary tract infection, site not specified: Secondary | ICD-10-CM

## 2022-08-25 DIAGNOSIS — F419 Anxiety disorder, unspecified: Secondary | ICD-10-CM | POA: Diagnosis present

## 2022-08-25 DIAGNOSIS — R110 Nausea: Secondary | ICD-10-CM | POA: Diagnosis present

## 2022-08-25 DIAGNOSIS — R011 Cardiac murmur, unspecified: Secondary | ICD-10-CM | POA: Diagnosis present

## 2022-08-25 DIAGNOSIS — R0902 Hypoxemia: Secondary | ICD-10-CM | POA: Diagnosis not present

## 2022-08-25 DIAGNOSIS — I639 Cerebral infarction, unspecified: Secondary | ICD-10-CM | POA: Diagnosis present

## 2022-08-25 DIAGNOSIS — R069 Unspecified abnormalities of breathing: Secondary | ICD-10-CM | POA: Diagnosis not present

## 2022-08-25 DIAGNOSIS — R609 Edema, unspecified: Secondary | ICD-10-CM | POA: Diagnosis not present

## 2022-08-25 DIAGNOSIS — I2510 Atherosclerotic heart disease of native coronary artery without angina pectoris: Secondary | ICD-10-CM | POA: Diagnosis present

## 2022-08-25 DIAGNOSIS — I5033 Acute on chronic diastolic (congestive) heart failure: Secondary | ICD-10-CM | POA: Diagnosis not present

## 2022-08-25 DIAGNOSIS — I10 Essential (primary) hypertension: Secondary | ICD-10-CM | POA: Diagnosis not present

## 2022-08-25 DIAGNOSIS — Z79899 Other long term (current) drug therapy: Secondary | ICD-10-CM

## 2022-08-25 DIAGNOSIS — Z803 Family history of malignant neoplasm of breast: Secondary | ICD-10-CM

## 2022-08-25 DIAGNOSIS — E785 Hyperlipidemia, unspecified: Secondary | ICD-10-CM | POA: Diagnosis present

## 2022-08-25 DIAGNOSIS — Z8379 Family history of other diseases of the digestive system: Secondary | ICD-10-CM

## 2022-08-25 DIAGNOSIS — Z9049 Acquired absence of other specified parts of digestive tract: Secondary | ICD-10-CM

## 2022-08-25 DIAGNOSIS — I272 Pulmonary hypertension, unspecified: Secondary | ICD-10-CM | POA: Diagnosis present

## 2022-08-25 DIAGNOSIS — R350 Frequency of micturition: Secondary | ICD-10-CM | POA: Diagnosis present

## 2022-08-25 DIAGNOSIS — R0602 Shortness of breath: Secondary | ICD-10-CM | POA: Diagnosis not present

## 2022-08-25 DIAGNOSIS — Z7982 Long term (current) use of aspirin: Secondary | ICD-10-CM

## 2022-08-25 DIAGNOSIS — K589 Irritable bowel syndrome without diarrhea: Secondary | ICD-10-CM | POA: Diagnosis not present

## 2022-08-25 DIAGNOSIS — I5031 Acute diastolic (congestive) heart failure: Secondary | ICD-10-CM | POA: Diagnosis not present

## 2022-08-25 DIAGNOSIS — R0600 Dyspnea, unspecified: Principal | ICD-10-CM

## 2022-08-25 DIAGNOSIS — E876 Hypokalemia: Secondary | ICD-10-CM | POA: Diagnosis present

## 2022-08-25 DIAGNOSIS — Z809 Family history of malignant neoplasm, unspecified: Secondary | ICD-10-CM

## 2022-08-25 DIAGNOSIS — E559 Vitamin D deficiency, unspecified: Secondary | ICD-10-CM | POA: Diagnosis not present

## 2022-08-25 DIAGNOSIS — N898 Other specified noninflammatory disorders of vagina: Secondary | ICD-10-CM | POA: Diagnosis present

## 2022-08-25 DIAGNOSIS — J449 Chronic obstructive pulmonary disease, unspecified: Secondary | ICD-10-CM | POA: Diagnosis present

## 2022-08-25 DIAGNOSIS — F17210 Nicotine dependence, cigarettes, uncomplicated: Secondary | ICD-10-CM | POA: Diagnosis present

## 2022-08-25 DIAGNOSIS — Z1152 Encounter for screening for COVID-19: Secondary | ICD-10-CM

## 2022-08-25 DIAGNOSIS — Z743 Need for continuous supervision: Secondary | ICD-10-CM | POA: Diagnosis not present

## 2022-08-25 DIAGNOSIS — F17200 Nicotine dependence, unspecified, uncomplicated: Secondary | ICD-10-CM | POA: Diagnosis not present

## 2022-08-25 DIAGNOSIS — Z7951 Long term (current) use of inhaled steroids: Secondary | ICD-10-CM

## 2022-08-25 DIAGNOSIS — Z818 Family history of other mental and behavioral disorders: Secondary | ICD-10-CM

## 2022-08-25 DIAGNOSIS — F332 Major depressive disorder, recurrent severe without psychotic features: Secondary | ICD-10-CM

## 2022-08-25 DIAGNOSIS — I700 Atherosclerosis of aorta: Secondary | ICD-10-CM | POA: Diagnosis not present

## 2022-08-25 DIAGNOSIS — I5A Non-ischemic myocardial injury (non-traumatic): Secondary | ICD-10-CM | POA: Diagnosis not present

## 2022-08-25 DIAGNOSIS — Z602 Problems related to living alone: Secondary | ICD-10-CM | POA: Diagnosis present

## 2022-08-25 DIAGNOSIS — Z811 Family history of alcohol abuse and dependence: Secondary | ICD-10-CM

## 2022-08-25 DIAGNOSIS — R45851 Suicidal ideations: Secondary | ICD-10-CM | POA: Diagnosis present

## 2022-08-25 DIAGNOSIS — I110 Hypertensive heart disease with heart failure: Secondary | ICD-10-CM | POA: Diagnosis not present

## 2022-08-25 DIAGNOSIS — J441 Chronic obstructive pulmonary disease with (acute) exacerbation: Secondary | ICD-10-CM | POA: Diagnosis not present

## 2022-08-25 DIAGNOSIS — Z90710 Acquired absence of both cervix and uterus: Secondary | ICD-10-CM

## 2022-08-25 LAB — TROPONIN I (HIGH SENSITIVITY)
Troponin I (High Sensitivity): 85 ng/L — ABNORMAL HIGH (ref <18)
Troponin I (High Sensitivity): 63 ng/L — ABNORMAL HIGH (ref <18)
Troponin I (High Sensitivity): 69 ng/L — ABNORMAL HIGH (ref <18)
Troponin I (High Sensitivity): 56 ng/L — ABNORMAL HIGH (ref <18)

## 2022-08-25 LAB — URINALYSIS, W/ REFLEX TO CULTURE (INFECTION SUSPECTED)
Bilirubin Urine: NEGATIVE
Glucose, UA: NEGATIVE mg/dL
Hgb urine dipstick: NEGATIVE
Ketones, ur: 5 mg/dL — AB
Leukocytes,Ua: NEGATIVE
Nitrite: NEGATIVE
Protein, ur: NEGATIVE mg/dL
Specific Gravity, Urine: 1.006 (ref 1.005–1.030)
Squamous Epithelial / HPF: NONE SEEN /HPF (ref 0–5)
pH: 5 (ref 5.0–8.0)

## 2022-08-25 LAB — BASIC METABOLIC PANEL
Anion gap: 12 (ref 5–15)
BUN: 18 mg/dL (ref 8–23)
CO2: 24 mmol/L (ref 22–32)
Calcium: 9.1 mg/dL (ref 8.9–10.3)
Chloride: 105 mmol/L (ref 98–111)
Creatinine, Ser: 1.05 mg/dL — ABNORMAL HIGH (ref 0.44–1.00)
GFR, Estimated: 54 mL/min — ABNORMAL LOW (ref >60)
Glucose, Bld: 106 mg/dL — ABNORMAL HIGH (ref 70–99)
Potassium: 3.2 mmol/L — ABNORMAL LOW (ref 3.5–5.1)
Sodium: 141 mmol/L (ref 135–145)

## 2022-08-25 LAB — CBC
HCT: 31.9 % — ABNORMAL LOW (ref 36.0–46.0)
Hemoglobin: 10.3 g/dL — ABNORMAL LOW (ref 12.0–15.0)
MCH: 30.9 pg (ref 26.0–34.0)
MCHC: 32.3 g/dL (ref 30.0–36.0)
MCV: 95.8 fL (ref 80.0–100.0)
Platelets: 201 10*3/uL (ref 150–400)
RBC: 3.33 MIL/uL — ABNORMAL LOW (ref 3.87–5.11)
RDW: 15.1 % (ref 11.5–15.5)
WBC: 11.4 10*3/uL — ABNORMAL HIGH (ref 4.0–10.5)
nRBC: 0.2 % (ref 0.0–0.2)

## 2022-08-25 LAB — SARS CORONAVIRUS 2 BY RT PCR: SARS Coronavirus 2 by RT PCR: NEGATIVE

## 2022-08-25 LAB — MAGNESIUM: Magnesium: 1.8 mg/dL (ref 1.7–2.4)

## 2022-08-25 LAB — BRAIN NATRIURETIC PEPTIDE: B Natriuretic Peptide: 1086.2 pg/mL — ABNORMAL HIGH (ref 0.0–100.0)

## 2022-08-25 MED ORDER — MONTELUKAST SODIUM 10 MG PO TABS
10.0000 mg | ORAL_TABLET | Freq: Every day | ORAL | Status: DC
  Administered 2022-08-25 – 2022-08-27 (×3): 10 mg via ORAL
  Filled 2022-08-25 (×3): qty 1

## 2022-08-25 MED ORDER — FUROSEMIDE 10 MG/ML IJ SOLN
40.0000 mg | Freq: Two times a day (BID) | INTRAMUSCULAR | Status: DC
  Administered 2022-08-26 – 2022-08-28 (×5): 40 mg via INTRAVENOUS
  Filled 2022-08-25 (×5): qty 4

## 2022-08-25 MED ORDER — CLOBETASOL PROPIONATE 0.05 % EX OINT
1.0000 | TOPICAL_OINTMENT | Freq: Every day | CUTANEOUS | Status: DC
  Administered 2022-08-26 – 2022-08-28 (×3): 1 via TOPICAL
  Filled 2022-08-25: qty 15

## 2022-08-25 MED ORDER — GABAPENTIN 300 MG PO CAPS
900.0000 mg | ORAL_CAPSULE | Freq: Three times a day (TID) | ORAL | Status: DC
  Administered 2022-08-25 – 2022-08-28 (×9): 900 mg via ORAL
  Filled 2022-08-25 (×9): qty 3

## 2022-08-25 MED ORDER — POTASSIUM CHLORIDE CRYS ER 20 MEQ PO TBCR
40.0000 meq | EXTENDED_RELEASE_TABLET | Freq: Once | ORAL | Status: AC
  Administered 2022-08-25: 40 meq via ORAL
  Filled 2022-08-25: qty 2

## 2022-08-25 MED ORDER — SODIUM CHLORIDE 0.9 % IV SOLN
1.0000 g | INTRAVENOUS | Status: AC
  Administered 2022-08-25 – 2022-08-27 (×3): 1 g via INTRAVENOUS
  Filled 2022-08-25 (×3): qty 10

## 2022-08-25 MED ORDER — ORAL CARE MOUTH RINSE: 15.0000 mL | OROMUCOSAL | Status: DC | PRN

## 2022-08-25 MED ORDER — LORAZEPAM 0.5 MG PO TABS
0.5000 mg | ORAL_TABLET | Freq: Three times a day (TID) | ORAL | Status: DC | PRN
  Administered 2022-08-26: 0.5 mg via ORAL
  Filled 2022-08-25: qty 1

## 2022-08-25 MED ORDER — MELATONIN 5 MG PO TABS
10.0000 mg | ORAL_TABLET | Freq: Every evening | ORAL | Status: DC | PRN
  Administered 2022-08-25: 10 mg via ORAL
  Filled 2022-08-25: qty 2

## 2022-08-25 MED ORDER — FUROSEMIDE 10 MG/ML IJ SOLN
60.0000 mg | Freq: Once | INTRAMUSCULAR | Status: AC
  Administered 2022-08-25: 60 mg via INTRAVENOUS
  Filled 2022-08-25: qty 8

## 2022-08-25 MED ORDER — ALBUTEROL SULFATE (2.5 MG/3ML) 0.083% IN NEBU: 2.5000 mg | INHALATION_SOLUTION | RESPIRATORY_TRACT | Status: DC | PRN

## 2022-08-25 MED ORDER — ATORVASTATIN CALCIUM 20 MG PO TABS
40.0000 mg | ORAL_TABLET | Freq: Every day | ORAL | Status: DC
  Administered 2022-08-25 – 2022-08-27 (×3): 40 mg via ORAL
  Filled 2022-08-25 (×3): qty 2

## 2022-08-25 MED ORDER — IPRATROPIUM-ALBUTEROL 0.5-2.5 (3) MG/3ML IN SOLN
3.0000 mL | Freq: Once | RESPIRATORY_TRACT | Status: AC
  Administered 2022-08-25: 3 mL via RESPIRATORY_TRACT
  Filled 2022-08-25: qty 3

## 2022-08-25 MED ORDER — IPRATROPIUM-ALBUTEROL 0.5-2.5 (3) MG/3ML IN SOLN
3.0000 mL | Freq: Four times a day (QID) | RESPIRATORY_TRACT | Status: DC
  Administered 2022-08-25 – 2022-08-28 (×11): 3 mL via RESPIRATORY_TRACT
  Filled 2022-08-25 (×11): qty 3

## 2022-08-25 MED ORDER — ACETAMINOPHEN 325 MG PO TABS
650.0000 mg | ORAL_TABLET | Freq: Four times a day (QID) | ORAL | Status: DC | PRN
  Administered 2022-08-27: 650 mg via ORAL
  Filled 2022-08-25: qty 2

## 2022-08-25 MED ORDER — NICOTINE 21 MG/24HR TD PT24
21.0000 mg | MEDICATED_PATCH | Freq: Every day | TRANSDERMAL | Status: DC
  Administered 2022-08-25 – 2022-08-28 (×4): 21 mg via TRANSDERMAL
  Filled 2022-08-25 (×4): qty 1

## 2022-08-25 MED ORDER — HYDRALAZINE HCL 20 MG/ML IJ SOLN: 5.0000 mg | INTRAMUSCULAR | Status: DC | PRN

## 2022-08-25 MED ORDER — FUROSEMIDE 10 MG/ML IJ SOLN: 40.0000 mg | Freq: Two times a day (BID) | INTRAMUSCULAR | Status: DC

## 2022-08-25 MED ORDER — ENOXAPARIN SODIUM 40 MG/0.4ML IJ SOSY
40.0000 mg | PREFILLED_SYRINGE | INTRAMUSCULAR | Status: DC
  Administered 2022-08-25 – 2022-08-27 (×3): 40 mg via SUBCUTANEOUS
  Filled 2022-08-25 (×3): qty 0.4

## 2022-08-25 MED ORDER — METHYLPREDNISOLONE SODIUM SUCC 125 MG IJ SOLR
125.0000 mg | Freq: Once | INTRAMUSCULAR | Status: AC
  Administered 2022-08-25: 125 mg via INTRAVENOUS
  Filled 2022-08-25: qty 2

## 2022-08-25 MED ORDER — DIPHENHYDRAMINE HCL 50 MG/ML IJ SOLN: 12.5000 mg | Freq: Three times a day (TID) | INTRAMUSCULAR | Status: DC | PRN

## 2022-08-25 MED ORDER — SODIUM CHLORIDE 0.9 % IV SOLN: INTRAVENOUS | Status: DC | PRN

## 2022-08-25 MED ORDER — IPRATROPIUM-ALBUTEROL 0.5-2.5 (3) MG/3ML IN SOLN
3.0000 mL | RESPIRATORY_TRACT | Status: DC
  Administered 2022-08-25: 3 mL via RESPIRATORY_TRACT
  Filled 2022-08-25: qty 3

## 2022-08-25 MED ORDER — DM-GUAIFENESIN ER 30-600 MG PO TB12: 1.0000 | ORAL_TABLET | Freq: Two times a day (BID) | ORAL | Status: DC | PRN

## 2022-08-25 MED ORDER — METOPROLOL SUCCINATE ER 25 MG PO TB24
12.5000 mg | ORAL_TABLET | Freq: Every day | ORAL | Status: DC
  Administered 2022-08-26 – 2022-08-28 (×3): 12.5 mg via ORAL
  Filled 2022-08-25 (×3): qty 1

## 2022-08-25 MED ORDER — ASPIRIN 81 MG PO CHEW
81.0000 mg | CHEWABLE_TABLET | Freq: Every day | ORAL | Status: DC
  Administered 2022-08-26 – 2022-08-28 (×3): 81 mg via ORAL
  Filled 2022-08-25 (×3): qty 1

## 2022-08-25 MED ORDER — METHOCARBAMOL 500 MG PO TABS: 500.0000 mg | ORAL_TABLET | Freq: Three times a day (TID) | ORAL | Status: DC | PRN

## 2022-08-25 NOTE — ED Notes (Signed)
Pt sat up and adjusted in bed, O2 came up to 94%. Will continue to monitor.

## 2022-08-25 NOTE — ED Triage Notes (Addendum)
Pt brought to ED via [facility] from Home for SOB. EMS states started last night and again this morning at 730. Pt states it feels like her heart is racing. Pt w/ a Hx of CHF and currently being treated for UTI since last wednesday. Pt does take Lasix. At home pt O2 at 85%, on 3L [place] O2 rose to 97%. Pt does wear 3L at night.  Vitals: 156/88 22 RR 80 HR NSR

## 2022-08-25 NOTE — H&P (Signed)
History and Physical    Beth [name] WJX:[id number] DOB: 11/06/1945 DOA: 08/25/2022  Referring MD/NP/PA:   PCP: Smitty Cords, DO   Patient coming from:  The patient is coming from home.     Chief Complaint: SOB, dysuria.  HPI: Beth [name] is a 77 y.o. female with medical history significant of dCHF, COPD on 3L of O2 only at night, HTN, HJLD, CAD, stroke, depression with anxiety, CKD stage IIIa, chronic pain and fibromyalgia, IBS, pulmonary hypertension, who presents with shortness of breath and dysuria.  Pt states that she started having worsening shortness of breath since last night.  She has dry cough, chest pressure.  No fever or chills.  She also has worsening bilateral leg edema.  Patient has nausea, no vomiting, diarrhea or abdominal pain.  She is normally using 3 L oxygen only at night, but was found to have oxygen desaturation to 85% today, started 3 L oxygen in ED.  Of note, patient has dysuria, burning on urination and increased urinary frequency, vaginal discharge recently.  Patient was given referral to OB/GYN on 7/23.  Patient had positive urinalysis (yellow color, 3+ leukocyte, 0-5 bacteria, WBC 31).  Patient was started on Macrobid on 7/23, but still have same symptoms. Patient also had negative wet prep test and negative Candida, trichomonas, chlamydia and Neisseria test.   Data reviewed independently and ED Course: pt was found to have trop 85, BNP 1086, WBC 11.4, stable renal function, potassium 3. 2, blood pressure 146/73, heart rate 101, 93, RR 22.  Chest x-ray showed interstitial edema.  Patient is admitted to PCU as inpatient.   EKG: I have personally reviewed.  Sinus rhythm, QTc 553, PAC, nonspecific T wave change   Review of Systems:   General: no fevers, chills, no body weight gain, has fatigue HEENT: no blurry vision, hearing changes or sore throat Respiratory: has dyspnea, coughing, no wheezing CV: has chest pressure, no palpitations GI:  has nausea, no vomiting, abdominal pain, diarrhea, constipation GU: has dysuria, burning on urination, increased urinary frequency, no hematuria  Ext: has leg edema Neuro: no unilateral weakness, numbness, or tingling, no vision change or hearing loss Skin: no rash, no skin tear. MSK: No muscle spasm, no deformity, no limitation of range of movement in spin Heme: No easy bruising.  Travel history: No recent long distant travel.   Allergy:  Allergies  Allergen Reactions   Bextra  [Valdecoxib]    Compazine [Prochlorperazine Edisylate]     Stroke-like symptoms   Lithium Carbonate     Leg weakness   Lyrica [Pregabalin]     Past Medical History:  Diagnosis Date   Allergy    Anxiety    Asthma    Chronic pain syndrome    discharged from pain clinic, hx of narcotics seeking behavior   COPD (chronic obstructive pulmonary disease) (HCC)    CVA (cerebral infarction)    Depression    Fibromyalgia    Headache    Hyperlipidemia    Hypertension    IBS (irritable bowel syndrome)    Stroke (HCC)    Vitamin D deficiency     Past Surgical History:  Procedure Laterality Date   ABDOMINAL HYSTERECTOMY     APPENDECTOMY     BREAST EXCISIONAL BIOPSY  2011   Pt states lump removed, ? side, no scar seen   BREAST SURGERY  2011   biopsy   CERVICAL DISCECTOMY     CHOLECYSTECTOMY     SINUSOTOMY  Social History:  reports that she has been smoking cigarettes. She started smoking about 58 years ago. She has a 14.3 pack-year smoking history. She has never used smokeless tobacco. She reports that she does not drink alcohol and does not use drugs.  Family History:  Family History  Problem Relation Age of Onset   Anxiety disorder Mother    Depression Mother    Breast cancer Mother 45   Cancer Father    Gallbladder disease Father    Alcohol abuse Father    Depression Father    Bipolar disorder Son      Prior to Admission medications   Medication Sig Start Date End Date Taking?  Authorizing Provider  acetaminophen (TYLENOL) 500 MG tablet Take 500 mg by mouth 2 (two) times daily as needed.    [provider]  albuterol (VENTOLIN HFA) 108 (90 Base) MCG/ACT inhaler Inhale 1-2 puffs into the lungs every 6 (six) hours as needed for wheezing or shortness of breath. 02/06/22   Karamalegos, Netta Neat, DO  aspirin 81 MG chewable tablet Chew 81 mg by mouth daily.    [provider]  atorvastatin (LIPITOR) 40 MG tablet TAKE 1 TABLET BY MOUTH EVERY DAY 12/26/21   Althea Charon, Netta Neat, DO  FLUoxetine (PROZAC) 20 MG capsule TAKE 3 CAPSULES(60 MG) BY MOUTH DAILY 05/09/22   Karamalegos, Netta Neat, DO  furosemide (LASIX) 20 MG tablet Take 2 tablets (40 mg total) by mouth daily. If swelling is less, can take only 1 pill. 02/21/21   Karamalegos, Netta Neat, DO  gabapentin (NEURONTIN) 300 MG capsule Take 300 mg by mouth 3 (three) times daily. Take with the 600mg  07/01/22   [provider]  gabapentin (NEURONTIN) 600 MG tablet TAKE 1 TABLET(600 MG) BY MOUTH THREE TIMES DAILY 06/18/22   Althea Charon, Netta Neat, DO  ketoconazole (NIZORAL) 2 % cream Apply 1 Application topically 2 (two) times daily. 06/25/22   Lorre Munroe, NP  Melatonin 3 MG CAPS Take 10 mg by mouth at bedtime as needed (sleep).     [provider]  methocarbamol (ROBAXIN) 500 MG tablet Take 1 tablet (500 mg total) by mouth every 8 (eight) hours as needed for muscle spasms. 04/24/22   Karamalegos, Netta Neat, DO  metoprolol succinate (TOPROL-XL) 25 MG 24 hr tablet TAKE 1/2 TABLET(12.5 MG) BY MOUTH DAILY 08/19/22   Karamalegos, Netta Neat, DO  montelukast (SINGULAIR) 10 MG tablet TAKE 1 TABLET(10 MG) BY MOUTH AT BEDTIME 03/13/22   Karamalegos, Alexander J, DO  ondansetron (ZOFRAN-ODT) 4 MG disintegrating tablet Take 1 tablet (4 mg total) by mouth every 8 (eight) hours as needed for nausea or vomiting. 04/05/22   Karamalegos, Netta Neat, DO  potassium chloride (KLOR-CON) 10 MEQ tablet TAKE 1  TABLET(10 MEQ) BY MOUTH DAILY 05/13/22   Althea Charon, Netta Neat, DO  QUEtiapine (SEROQUEL) 200 MG tablet Take 1 tablet (200 mg total) by mouth at bedtime. Take total of 250 mg. Take along with 50 mg tab 06/17/22 09/15/22  Neysa Hotter, MD  QUEtiapine (SEROQUEL) 50 MG tablet Take 1 tablet (50 mg total) by mouth at bedtime. Total of 250 mg at night. Take along with 200 mg tab 06/04/22 09/02/22  Neysa Hotter, MD  tizanidine (ZANAFLEX) 2 MG capsule Take 1 capsule (2 mg total) by mouth 3 (three) times daily. 08/12/22   Lorre Munroe, NP  Harrel Carina ELLIPTA 200-62.5-25 MCG/ACT AEPB INHALE 1 PUFF INTO THE LUNGS DAILY 08/14/22   Salena Saner, MD    Physical  Exam: Vitals:   08/25/22 1400 08/25/22 1430 08/25/22 1528 08/25/22 1548  BP: (!) 146/73 (!) 151/85 (!) 134/58   Pulse: 93  93   Resp: 19 20 18    Temp:   97.6 F (36.4 C)   TempSrc:   Oral   SpO2: 100% 100% 99% 99%  Weight:      Height:       General: Not in acute distress HEENT:       Eyes: PERRL, EOMI, no jaundice       ENT: No discharge from the ears and nose, no pharynx injection, no tonsillar enlargement.        Neck: Has positive JVD.  No bruit, no mass felt. Heme: No neck lymph node enlargement. Cardiac: S1/S2, RRR, No murmurs, No gallops or rubs. Respiratory: Has decreased air movement bilaterally, has fine crackles.  No wheezing GI: Soft, nondistended, nontender, no rebound pain, no organomegaly, BS present. GU: No hematuria Ext: Has 1+ pitting leg edema bilaterally. 1+DP/PT pulse bilaterally. Musculoskeletal: No joint deformities, No joint redness or warmth, no limitation of ROM in spin. Skin: No rashes.  Neuro: Alert, oriented X3, cranial nerves II-XII grossly intact, moves all extremities normally.  Psych: Patient is not psychotic, no suicidal or hemocidal ideation.  Labs on Admission: I have personally reviewed following labs and imaging studies  CBC: Recent Labs  Lab 08/25/22 1229  WBC 11.4*  HGB 10.3*  HCT 31.9*   MCV 95.8  PLT 201   Basic Metabolic Panel: Recent Labs  Lab 08/25/22 1229  NA 141  K 3.2*  CL 105  CO2 24  GLUCOSE 106*  BUN 18  CREATININE 1.05*  CALCIUM 9.1  MG 1.8   GFR: Estimated Creatinine Clearance: 39.7 mL/min (A) (by C-G formula based on SCr of 1.05 mg/dL (H)). Liver Function Tests: No results for input(s): "AST", "ALT", "ALKPHOS", "BILITOT", "PROT", "ALBUMIN" in the last 168 hours. No results for input(s): "LIPASE", "AMYLASE" in the last 168 hours. No results for input(s): "AMMONIA" in the last 168 hours. Coagulation Profile: No results for input(s): "INR", "PROTIME" in the last 168 hours. Cardiac Enzymes: No results for input(s): "CKTOTAL", "CKMB", "CKMBINDEX", "TROPONINI" in the last 168 hours. BNP (last 3 results) No results for input(s): "PROBNP" in the last 8760 hours. HbA1C: No results for input(s): "HGBA1C" in the last 72 hours. CBG: No results for input(s): "GLUCAP" in the last 168 hours. Lipid Profile: No results for input(s): "CHOL", "HDL", "LDLCALC", "TRIG", "CHOLHDL", "LDLDIRECT" in the last 72 hours. Thyroid Function Tests: No results for input(s): "TSH", "T4TOTAL", "FREET4", "T3FREE", "THYROIDAB" in the last 72 hours. Anemia Panel: No results for input(s): "VITAMINB12", "FOLATE", "FERRITIN", "TIBC", "IRON", "RETICCTPCT" in the last 72 hours. Urine analysis:    Component Value Date/Time   COLORURINE STRAW (A) 08/25/2022 1740   APPEARANCEUR CLEAR (A) 08/25/2022 1740   APPEARANCEUR Hazy 12/25/2012 1507   LABSPEC 1.006 08/25/2022 1740   LABSPEC 1.015 12/25/2012 1507   PHURINE 5.0 08/25/2022 1740   GLUCOSEU NEGATIVE 08/25/2022 1740   GLUCOSEU Negative 12/25/2012 1507   HGBUR NEGATIVE 08/25/2022 1740   BILIRUBINUR NEGATIVE 08/25/2022 1740   BILIRUBINUR Negative 06/26/2022 1525   BILIRUBINUR Negative 12/25/2012 1507   KETONESUR 5 (A) 08/25/2022 1740   PROTEINUR NEGATIVE 08/25/2022 1740   UROBILINOGEN 0.2 06/26/2022 1525   NITRITE NEGATIVE  08/25/2022 1740   LEUKOCYTESUR NEGATIVE 08/25/2022 1740   LEUKOCYTESUR 3+ 12/25/2012 1507   Sepsis Labs: @LABRCNTIP (procalcitonin:4,lacticidven:4) ) Recent Results (from the past 240 hour(s))  SARS Coronavirus  2 by RT PCR (hospital order, performed in [facility] hospital lab) *cepheid single result test* Urine, Clean Catch     Status: None   Collection Time: 08/25/22  4:30 PM   Specimen: Urine, Clean Catch; Nasal Swab  Result Value Ref Range Status   SARS Coronavirus 2 by RT PCR NEGATIVE NEGATIVE Final    Comment: (NOTE) SARS-CoV-2 target nucleic acids are NOT DETECTED.  The SARS-CoV-2 RNA is generally detectable in upper and lower respiratory specimens during the acute phase of infection. The lowest concentration of SARS-CoV-2 viral copies this assay can detect is 250 copies / mL. A negative result does not preclude SARS-CoV-2 infection and should not be used as the sole basis for treatment or other patient management decisions.  A negative result may occur with improper specimen collection / handling, submission of specimen other than nasopharyngeal swab, presence of viral mutation(s) within the areas targeted by this assay, and inadequate number of viral copies (<250 copies / mL). A negative result must be combined with clinical observations, patient history, and epidemiological information.  Fact Sheet for Patients:   RoadLapTop.co.za  Fact Sheet for Healthcare Providers: http://kim-miller.com/  This test is not yet approved or  cleared by the [place] FDA and has been authorized for detection and/or diagnosis of SARS-CoV-2 by FDA under an Emergency Use Authorization (EUA).  This EUA will remain in effect (meaning this test can be used) for the duration of the COVID-19 declaration under Section 564(b)(1) of the Act, 21 U.S.C. section 360bbb-3(b)(1), unless the authorization is terminated or revoked sooner.  Performed at  [facility], [address] [place], [place] [address]      Radiological Exams on Admission: DG Chest Portable 1 View  Result Date: 08/25/2022 CLINICAL DATA:  Shortness of breath EXAM: PORTABLE CHEST 1 VIEW COMPARISON:  X-ray 03/24/2022 FINDINGS: Normal cardiopericardial silhouette with a calcified aorta. Diffuse interstitial changes. No pneumothorax, effusion or consolidation. Overlapping cardiac leads. Fixation hardware along the lower cervical spine. Calcified aorta. Hyperinflation. IMPRESSION: Hyperinflation. Interstitial changes. Increased from previous. Acute process is possible. Recommend follow-up Electronically Signed   By: Karen Kays M.D.   On: 08/25/2022 13:56      Assessment/Plan Principal Problem:   Acute on chronic diastolic CHF (congestive heart failure) (HCC) Active Problems:   Severe pulmonary arterial systolic hypertension (HCC)   COPD, severe (HCC)   Myocardial injury   Stroke (HCC)   HLD (hyperlipidemia)   HTN (hypertension)   Chronic kidney disease, stage 3a (HCC)   Hypokalemia   Prolonged QT interval   UTI (urinary tract infection)   Anxiety and depression   Smoking   Assessment and Plan:   Acute on chronic diastolic CHF (congestive heart failure) in [facility]): Patient has 1+ leg edema, elevated BNP 1086, positive JVD, crackles on auscultation, interstitial pulmonary edema on chest x-ray, clinically consistent with CHF exacerbation.  2D echo on 10/11/2020 showed EF of 55-60% with grade 1 diastolic dysfunction.   -Will admit to PCU as inpatient -Lasix 40 mg bid by IV (patient received 60 mg of Lasix in ED) -2d echo -Daily weights -strict I/O's -Low salt diet -Fluid restriction -As needed bronchodilators for shortness of breath  Severe pulmonary arterial systolic hypertension (HCC): -on IV lasix as above  COPD, severe (HCC): Currently no wheezing or rhonchi auscultation to me.  Patient received 1 dose of Solu-Medrol in  ED. -Bronchodilators -Singulair -As needed Mucinex  Myocardial injury: Troponin level 85. likely due to demand ischemia -Trend troponin -Aspirin,  Lipitor -Check A1c, FLP  Stroke (HCC) -Aspirin, Lipitor  HLD (hyperlipidemia) -Lipitor  HTN (hypertension) -IV hydralazine as needed -Metoprolol  Chronic kidney disease, stage 3a (HCC): Renal function stable.  Baseline creatinine 1.11 on 03/24/2022.  Her creatinine is 1.05, BUN 18, GFR 54 -Follow up with BMP  Hypokalemia: Potassium 3.2 -Repeat potassium -Check magnesium level --> 1.8  Prolonged QT interval: QTc 553 -Hold Prozac and Seroquel  UTI (urinary tract infection) -Start Rocephin -Follow-up urine culture  Anxiety and depression -Hold Prozac and Seroquel -As needed Ativan  Smoking -Nicotine patch     DVT ppx: SQ Lovenox  Code Status: Full code per pt  Family Communication:   Yes, patient's son by phone  Disposition Plan:  Anticipate discharge back to previous environment  Consults called:  none  Admission status and Level of care: Progressive:   as inpt       Dispo: The patient is from: Home              Anticipated d/c is to: Home              Anticipated d/c date is: 2 days              Patient currently is not medically stable to d/c.    Severity of Illness:  The appropriate patient status for this patient is INPATIENT. Inpatient status is judged to be reasonable and necessary in order to provide the required intensity of service to ensure the patient's safety. The patient's presenting symptoms, physical exam findings, and initial radiographic and laboratory data in the context of their chronic comorbidities is felt to place them at high risk for further clinical deterioration. Furthermore, it is not anticipated that the patient will be medically stable for discharge from the hospital within 2 midnights of admission.     * I certify that at the point of admission it is my clinical judgment that  the patient will require inpatient hospital care spanning beyond 2 midnights from the point of admission due to high intensity of service, high risk for further deterioration and high frequency of surveillance required.*       Date of Service 08/25/2022    Lorretta Harp [facility]   If 7PM-7AM, please contact night-coverage [email or web address] 08/25/2022, 6:43 PM

## 2022-08-25 NOTE — ED Notes (Signed)
Pt placed back on 3L [place] due to O2 dropping to 89%.

## 2022-08-25 NOTE — ED Notes (Signed)
Advised nurse that patient has ready bed 

## 2022-08-25 NOTE — ED Notes (Signed)
Trial off [place], will monitor for decrease in O2 sats.

## 2022-08-25 NOTE — ED Provider Notes (Signed)
[facility] Provider Note    Event Date/Time   First MD Initiated Contact with Patient 08/25/22 1234     (approximate)  History   Chief Complaint: Shortness of Breath  HPI  Beth [name] is a 77 y.o. female with a past medical history of CHF, COPD, hypertension, hyperlipidemia, CVA, presents to the emergency department for worsening shortness of breath.  According to the patient for the last several days she has had worsening shortness of breath.  Patient typically wears oxygen at night only does not require any oxygen during the day.  Currently satting 85% on room air, satting in the upper 90s on 3 L.  Patient states she has had a slight cough.  Has also had some increased lower extremity edema.  Denies any fever.  Physical Exam   Triage Vital Signs: ED Triage Vitals  Encounter Vitals Group     BP 08/25/22 1230 (!) 140/74     Systolic BP Percentile --      Diastolic BP Percentile --      Pulse Rate 08/25/22 1230 84     Resp 08/25/22 1230 (!) 21     Temp --      Temp src --      SpO2 08/25/22 1230 100 %     Weight 08/25/22 1227 138 lb (62.6 kg)     Height 08/25/22 1227 5\' 5"  (1.651 m)     Head Circumference --      Peak Flow --      Pain Score 08/25/22 1224 10     Pain Loc --      Pain Education --      Exclude from Growth Chart --     Most recent vital signs: Vitals:   08/25/22 1230  BP: (!) 140/74  Pulse: 84  Resp: (!) 21  SpO2: 100%    General: Awake, no distress.  CV:  Good peripheral perfusion.  Regular rate and rhythm  Resp:  Normal effort.  Equal breath sounds bilaterally.  Abd:  No distention.  Soft, nontender.  No rebound or guarding. Other:  1+ lower extremity edema bilaterally  ED Results / Procedures / Treatments   EKG  EKG viewed and interpreted by myself shows sinus rhythm at 89 bpm with a narrow QRS, normal axis, slight QTc prolongation, nonspecific ST changes.  RADIOLOGY  I reviewed and interpreted chest x-ray  images.  Patient appears to have mild opacities bilaterally possibly consistent with pulmonary edema this appears to be increased from the patient's last chest x-ray my evaluation. Radiology has read the x-rays hyperinflation with interstitial changes possible acute process.   MEDICATIONS ORDERED IN ED: Medications  methylPREDNISolone sodium succinate (SOLU-MEDROL) 125 mg/2 mL injection 125 mg (125 mg Intravenous Given 08/25/22 1243)  ipratropium-albuterol (DUONEB) 0.5-2.5 (3) MG/3ML nebulizer solution 3 mL (3 mLs Nebulization Given 08/25/22 1243)  ipratropium-albuterol (DUONEB) 0.5-2.5 (3) MG/3ML nebulizer solution 3 mL (3 mLs Nebulization Given 08/25/22 1243)     IMPRESSION / MDM / ASSESSMENT AND PLAN / ED COURSE  I reviewed the triage vital signs and the nursing notes.  Patient's presentation is most consistent with acute presentation with potential threat to life or bodily function.  Patient presents emergency department for shortness of breath.  Currently satting 85% on room air satting well on 3 L.  Patient states a history of COPD as well as CHF does have mild lower extremity edema on my examination.  We will check labs including cardiac enzymes  and a BNP will obtain a chest x-ray we will treat with DuoNebs and Solu-Medrol while awaiting further results.  Differential would include COPD exacerbation, pneumonia, pneumothorax, ACS, CHF/pulmonary edema.  Patient has workup shows a elevated BNP to 1080 as well as a slight troponin elevation to 85.  Patient CBC shows no significant findings or changes and chemistry is largely reassuring.  Chest x-ray does appear to show pulmonary edema on my evaluation or at least interstitial edema.  Radiology has read this as possible acute process.  Given the patient's shortness of breath satting 85% on room air not normally requiring oxygen during the daytime with chest x-ray findings and a significant increase in BNP and a slight troponin elevation we will  admit to the hospital service for continued diuresis.  I have ordered 60 mg of IV Lasix.  We will trend the troponin but hold off on heparin for now as I believe this is more likely demand ischemia less likely ACS.  FINAL CLINICAL IMPRESSION(S) / ED DIAGNOSES   Dyspnea Hypoxia CHF exacerbation  Note:  This document was prepared using Dragon voice recognition software and may include unintentional dictation errors.   Minna Antis, MD 08/25/22 1420

## 2022-08-25 NOTE — ED Notes (Signed)
Pt states she has bilateral hip pain that is off and on for the last month.

## 2022-08-25 NOTE — ED Notes (Signed)
XR at bedside

## 2022-08-25 NOTE — Plan of Care (Signed)
  Problem: Education: Goal: Ability to verbalize understanding of medication therapies will improve Outcome: Progressing   Problem: Cardiac: Goal: Ability to achieve and maintain adequate cardiopulmonary perfusion will improve Outcome: Progressing   Problem: Education: Goal: Knowledge of disease or condition will improve Outcome: Progressing

## 2022-08-26 ENCOUNTER — Inpatient Hospital Stay (HOSPITAL_COMMUNITY)
Admit: 2022-08-26 | Discharge: 2022-08-26 | Disposition: A | Discharge status: Home / Self Care | Payer: 59 | Attending: Internal Medicine | Admitting: Internal Medicine

## 2022-08-26 DIAGNOSIS — I5031 Acute diastolic (congestive) heart failure: Secondary | ICD-10-CM

## 2022-08-26 DIAGNOSIS — I5033 Acute on chronic diastolic (congestive) heart failure: Secondary | ICD-10-CM | POA: Diagnosis not present

## 2022-08-26 MED ORDER — OXYCODONE-ACETAMINOPHEN 5-325 MG PO TABS
1.0000 | ORAL_TABLET | Freq: Four times a day (QID) | ORAL | Status: DC | PRN
  Administered 2022-08-26 – 2022-08-28 (×6): 1 via ORAL
  Filled 2022-08-26 (×6): qty 1

## 2022-08-26 MED ORDER — RISAQUAD PO CAPS
2.0000 | ORAL_CAPSULE | Freq: Two times a day (BID) | ORAL | Status: DC
  Administered 2022-08-26 – 2022-08-28 (×5): 2 via ORAL
  Filled 2022-08-26 (×5): qty 2

## 2022-08-26 MED ORDER — POTASSIUM CHLORIDE CRYS ER 20 MEQ PO TBCR
40.0000 meq | EXTENDED_RELEASE_TABLET | Freq: Two times a day (BID) | ORAL | Status: AC
  Administered 2022-08-26: 40 meq via ORAL
  Filled 2022-08-26 (×2): qty 2

## 2022-08-26 NOTE — Evaluation (Signed)
Occupational Therapy Evaluation Patient Details Name: Beth [name] MRN: [id number] DOB: 22-Jul-1945 Today's Date: 08/26/2022   History of Present Illness Beth [name] is a 77 y.o. female with medical history significant of dCHF, COPD on 3L of O2 only at night, HTN, HJLD, CAD, stroke, depression with anxiety, CKD stage IIIa, chronic pain and fibromyalgia, IBS, pulmonary hypertension, who presents with shortness of breath and dysuria.   Clinical Impression   Beth [name] was seen for OT evaluation this date. Prior to hospital admission, pt was MOD I for mobility and ADLs using rollator. Pt lives alone. Pt currently requires SUPERVISION + RW standing grooming tasks and toilet t/f. SpO2 81% on RA, resolved to 91% on 3L [place]. SpO2 86% on 3L  standing, resolved to 88% sitting. RN notified. Pt reports 2/10 chest pressure creating difficulty breathing. Pt would benefit from skilled OT to address noted impairments and functional limitations (see below for any additional details). Upon hospital discharge, recommend OT follow up.   Recommendations for follow up therapy are one component of a multi-disciplinary discharge planning process, led by the attending physician.  Recommendations may be updated based on patient status, additional functional criteria and insurance authorization.   Assistance Recommended at Discharge Intermittent Supervision/Assistance  Patient can return home with the following A little help with bathing/dressing/bathroom;Help with stairs or ramp for entrance    Functional Status Assessment  Patient has had a recent decline in their functional status and demonstrates the ability to make significant improvements in function in a reasonable and predictable amount of time.  Equipment Recommendations  BSC/3in1    Recommendations for Other Services       Precautions / Restrictions Precautions Precautions: None Restrictions Weight Bearing Restrictions: No      Mobility  Bed Mobility Overal bed mobility: Modified Independent                  Transfers Overall transfer level: Needs assistance Equipment used: None Transfers: Sit to/from Stand Sit to Stand: Supervision                  Balance Overall balance assessment: Needs assistance Sitting-balance support: No upper extremity supported, Feet supported Sitting balance-Leahy Scale: Normal     Standing balance support: No upper extremity supported, During functional activity Standing balance-Leahy Scale: Good                             ADL either performed or assessed with clinical judgement   ADL Overall ADL's : Needs assistance/impaired                                       General ADL Comments: SUPERVISION + RW standing grooming tasks and toilet t/f.       Pertinent Vitals/Pain Pain Assessment Pain Assessment: 0-10 Pain Score: 2  Pain Location: chest Pain Descriptors / Indicators: Pressure Pain Intervention(s): Limited activity within patient's tolerance     Hand Dominance Right   Extremity/Trunk Assessment Upper Extremity Assessment Upper Extremity Assessment: Overall WFL for tasks assessed   Lower Extremity Assessment Lower Extremity Assessment: Generalized weakness       Communication Communication Communication: No difficulties   Cognition Arousal/Alertness: Awake/alert Behavior During Therapy: WFL for tasks assessed/performed Overall Cognitive Status: Within Functional Limits for tasks assessed  General Comments  SpO2 81% on RA, resolved to 91% on 3L [place]. SpO2 86% on 3L [place] standing, resolved to 90s sitting.            Home Living Family/patient expects to be discharged to:: Private residence Living Arrangements: Alone Available Help at Discharge: Family;Available PRN/intermittently Type of Home: Apartment Home Access: Level entry     Home Layout: One level                Home Equipment: Rollator (4 wheels)          Prior Functioning/Environment Prior Level of Function : Independent/Modified Independent;Driving             Mobility Comments: Pt reports MOD I household/community distances with rollator ADLs Comments: IND and cares for her small dog        OT Problem List: Decreased strength;Decreased activity tolerance;Impaired balance (sitting and/or standing);Decreased safety awareness      OT Treatment/Interventions: Therapeutic exercise;Self-care/ADL training;Energy conservation;DME and/or AE instruction;Therapeutic activities;Patient/family education    OT Goals(Current goals can be found in the care plan section) Acute Rehab OT Goals Patient Stated Goal: to go home OT Goal Formulation: With patient Time For Goal Achievement: 09/09/22 Potential to Achieve Goals: Good ADL Goals Pt Will Perform Grooming: with modified independence;standing (tolerate >10 mins) Pt Will Perform Lower Body Dressing: with modified independence;sit to/from stand Pt Will Transfer to Toilet: with modified independence;ambulating;regular height toilet  OT Frequency: Min 1X/week    Co-evaluation              AM-PAC OT "6 Clicks" Daily Activity     Outcome Measure Help from another person eating meals?: None Help from another person taking care of personal grooming?: A Little Help from another person toileting, which includes using toliet, bedpan, or urinal?: A Little Help from another person bathing (including washing, rinsing, drying)?: A Little Help from another person to put on and taking off regular upper body clothing?: None Help from another person to put on and taking off regular lower body clothing?: None 6 Click Score: 21   End of Session Equipment Utilized During Treatment: Rolling walker (2 wheels);Oxygen Nurse Communication: Mobility status  Activity Tolerance: Patient tolerated treatment well Patient left: in bed;with bed  alarm set;with call bell/phone within reach  OT Visit Diagnosis: Other abnormalities of gait and mobility (R26.89);Muscle weakness (generalized) (M62.81)                Time: 1610-9604 OT Time Calculation (min): 27 min Charges:  OT General Charges $OT Visit: 1 Visit OT Evaluation $OT Eval Moderate Complexity: 1 Mod OT Treatments $Self Care/Home Management : 8-22 mins  Kathie Dike, M.S. OTR/L  08/26/22, 12:35 PM  ascom [phone]

## 2022-08-26 NOTE — Hospital Course (Addendum)
Beth [name] is a 77 y.o. female with medical history significant of dCHF, COPD on 3L of O2 only at night, HTN, HJLD, CAD, stroke, depression with anxiety, CKD stage IIIa, chronic pain and fibromyalgia, IBS, pulmonary hypertension, who presents with shortness of breath and dysuria.  07/28: admitted w/ HFpEF exacerbation, UTI.  07/29: still fluid overload, await cultures, desats on room air and SNF recommended but pt/family prefer HH. Echo EF 55-60%, indeterminate diastolic  07/30: still SOB on ambulation, (+)rales on exam. Continue diuresis   Consultants:  none  Procedures: none      ASSESSMENT & PLAN:   Principal Problem:   Acute on chronic diastolic CHF (congestive heart failure) (HCC) Active Problems:   Severe pulmonary arterial systolic hypertension (HCC)   COPD, severe (HCC)   Myocardial injury   Stroke (HCC)   HLD (hyperlipidemia)   HTN (hypertension)   Chronic kidney disease, stage 3a (HCC)   Hypokalemia   Prolonged QT interval   UTI (urinary tract infection)   Anxiety and depression   Smoking    Acute on chronic diastolic CHF (congestive heart failure) in [facility]): Patient has 1+ leg edema, elevated BNP 1086, positive JVD, crackles on auscultation, interstitial pulmonary edema on chest x-ray, clinically consistent with CHF exacerbation.  2D echo on 10/11/2020 showed EF of 55-60% with grade 1 diastolic dysfunction. Echo this admission EF 55-60%, indeterminate diastolic  Lasix 40 mg bid by IV Daily weights strict I/O's Low salt diet Fluid restriction   Severe pulmonary arterial systolic hypertension (HCC): on IV lasix as above   COPD, severe (HCC): Currently no wheezing or rhonchi auscultation to me.  Patient received 1 dose of Solu-Medrol in ED. Bronchodilators Singulair As needed Mucinex   Myocardial injury:  Troponin level 85. likely due to demand ischemia Trend troponin --> down-trending  Aspirin, Lipitor Check A1c, FLP   Stroke (HCC) Aspirin,  Lipitor   HLD (hyperlipidemia) Lipitor   HTN (hypertension) IV hydralazine as needed Metoprolol   Chronic kidney disease, stage 3a (HCC):  Renal function stable.  Baseline creatinine 1.11 on 03/24/2022.  Her creatinine is 1.05, BUN 18, GFR 54 Follow up with BMP   Hypokalemia: Potassium 3.2 Repeat potassium Check magnesium level --> 1.8   Prolonged QT interval: QTc 553 Hold Prozac and Seroquel   UTI (urinary tract infection) vs vaginitis  Recent UCX mixed flora  3 days Rocephin Follow outpatient w/ GYN   Anxiety and depression Hold Prozac and Seroquel As needed Ativan   Smoking Nicotine patch     DVT prophylaxis: lovenox  Pertinent IV fluids/nutrition: no IV fluids, cardiac fluid restricted diet  Central lines / invasive devices: none  Code Status: FULL CODE ACP documentation reviewed: 08/26/22 none on file   Current Admission Status: inpatient   TOC needs / Dispo plan: PT recs for SNF but pt/family request HH, TOC waiting to hear back re: [facility] acceptance  Barriers to discharge / significant pending items: clinical improvement, continue diuresing

## 2022-08-26 NOTE — Plan of Care (Signed)
  Problem: Education: Goal: Ability to demonstrate management of disease process will improve Outcome: Progressing Goal: Ability to verbalize understanding of medication therapies will improve Outcome: Progressing Goal: Individualized Educational Video(s) Outcome: Progressing   Problem: Activity: Goal: Capacity to carry out activities will improve Outcome: Progressing   Problem: Cardiac: Goal: Ability to achieve and maintain adequate cardiopulmonary perfusion will improve Outcome: Progressing   Problem: Education: Goal: Knowledge of disease or condition will improve Outcome: Progressing Goal: Knowledge of the prescribed therapeutic regimen will improve Outcome: Progressing Goal: Individualized Educational Video(s) Outcome: Progressing   Problem: Activity: Goal: Ability to tolerate increased activity will improve Outcome: Progressing Goal: Will verbalize the importance of balancing activity with adequate rest periods Outcome: Progressing   Problem: Respiratory: Goal: Ability to maintain a clear airway will improve Outcome: Progressing Goal: Levels of oxygenation will improve Outcome: Progressing Goal: Ability to maintain adequate ventilation will improve Outcome: Progressing   Problem: Education: Goal: Knowledge of General Education information will improve Description Including pain rating scale, medication(s)/side effects and non-pharmacologic comfort measures Outcome: Progressing   Problem: Health Behavior/Discharge Planning: Goal: Ability to manage health-related needs will improve Outcome: Progressing   Problem: Clinical Measurements: Goal: Ability to maintain clinical measurements within normal limits will improve Outcome: Progressing Goal: Will remain free from infection Outcome: Progressing Goal: Diagnostic test results will improve Outcome: Progressing Goal: Respiratory complications will improve Outcome: Progressing Goal: Cardiovascular complication  will be avoided Outcome: Progressing   Problem: Activity: Goal: Risk for activity intolerance will decrease Outcome: Progressing   Problem: Nutrition: Goal: Adequate nutrition will be maintained Outcome: Progressing   Problem: Coping: Goal: Level of anxiety will decrease Outcome: Progressing   Problem: Elimination: Goal: Will not experience complications related to bowel motility Outcome: Progressing Goal: Will not experience complications related to urinary retention Outcome: Progressing   Problem: Pain Managment: Goal: General experience of comfort will improve Outcome: Progressing   Problem: Safety: Goal: Ability to remain free from injury will improve Outcome: Progressing   Problem: Skin Integrity: Goal: Risk for impaired skin integrity will decrease Outcome: Progressing

## 2022-08-26 NOTE — Progress Notes (Signed)
PROGRESS NOTE    Beth GASCO   ZOX:[id number] DOB: 14-Oct-1945  DOA: 08/25/2022 Date of Service: 08/26/22 PCP: Smitty Cords, DO     Brief Narrative / Hospital Course:   Beth [name] is a 77 y.o. female with medical history significant of dCHF, COPD on 3L of O2 only at night, HTN, HJLD, CAD, stroke, depression with anxiety, CKD stage IIIa, chronic pain and fibromyalgia, IBS, pulmonary hypertension, who presents with shortness of breath and dysuria.  07/28: admitted w/ HFpEF exacerbation, UTI.  07/29: still fluid overload, await cultures, desats on room air   Consultants:  none  Procedures: none      ASSESSMENT & PLAN:   Principal Problem:   Acute on chronic diastolic CHF (congestive heart failure) (HCC) Active Problems:   Severe pulmonary arterial systolic hypertension (HCC)   COPD, severe (HCC)   Myocardial injury   Stroke (HCC)   HLD (hyperlipidemia)   HTN (hypertension)   Chronic kidney disease, stage 3a (HCC)   Hypokalemia   Prolonged QT interval   UTI (urinary tract infection)   Anxiety and depression   Smoking    Acute on chronic diastolic CHF (congestive heart failure) in [facility]): Patient has 1+ leg edema, elevated BNP 1086, positive JVD, crackles on auscultation, interstitial pulmonary edema on chest x-ray, clinically consistent with CHF exacerbation.  2D echo on 10/11/2020 showed EF of 55-60% with grade 1 diastolic dysfunction. Lasix 40 mg bid by IV 2d echo Daily weights strict I/O's Low salt diet Fluid restriction   Severe pulmonary arterial systolic hypertension (HCC): on IV lasix as above   COPD, severe (HCC): Currently no wheezing or rhonchi auscultation to me.  Patient received 1 dose of Solu-Medrol in ED. Bronchodilators Singulair As needed Mucinex   Myocardial injury:  Troponin level 85. likely due to demand ischemia Trend troponin --> down-trending  Aspirin, Lipitor Check A1c, FLP   Stroke (HCC) Aspirin, Lipitor    HLD (hyperlipidemia) Lipitor   HTN (hypertension) IV hydralazine as needed Metoprolol   Chronic kidney disease, stage 3a (HCC):  Renal function stable.  Baseline creatinine 1.11 on 03/24/2022.  Her creatinine is 1.05, BUN 18, GFR 54 Follow up with BMP   Hypokalemia: Potassium 3.2 Repeat potassium Check magnesium level --> 1.8   Prolonged QT interval: QTc 553 Hold Prozac and Seroquel   UTI (urinary tract infection) Start Rocephin Follow-up urine culture   Anxiety and depression Hold Prozac and Seroquel As needed Ativan   Smoking Nicotine patch     DVT prophylaxis: lovenox  Pertinent IV fluids/nutrition: no IV fluids, cardiac fluid restricted diet  Central lines / invasive devices: none  Code Status: FULL CODE ACP documentation reviewed: 08/26/22 none on file   Current Admission Status: inpatient   TOC needs / Dispo plan: PT recs for SNF Barriers to discharge / significant pending items: clinical improvement, UCx             Subjective / Brief ROS:  Patient reports breathing a bit better today  Denies CP.  Pain controlled.  Denies new weakness.  Tolerating diet.  Reports no concerns w/ urination/defecation.   Family Communication: none at this time     Objective Findings:  Vitals:   08/26/22 0720 08/26/22 0805 08/26/22 1239 08/26/22 1301  BP:  130/62 120/65   Pulse:  95 95   Resp:  17 19   Temp:  98 F (36.7 C) 98 F (36.7 C)   TempSrc:      SpO2: 93% 97% Marland Kitchen)  89% 94%  Weight:      Height:        Intake/Output Summary (Last 24 hours) at 08/26/2022 1458 Last data filed at 08/26/2022 1415 Gross per 24 hour  Intake 342.85 ml  Output 700 ml  Net -357.15 ml   Filed Weights   08/25/22 1227 08/26/22 0348  Weight: 62.6 kg 66.3 kg    Examination:  Physical Exam Constitutional:      General: She is not in acute distress. Cardiovascular:     Rate and Rhythm: Normal rate and regular rhythm.     Heart sounds: Murmur heard.  Pulmonary:      Effort: Pulmonary effort is normal.     Breath sounds: Examination of the right-lower field reveals rales. Examination of the left-lower field reveals rales. Rales present.  Musculoskeletal:     Right lower leg: No edema.     Left lower leg: No edema.  Neurological:     General: No focal deficit present.     Mental Status: She is alert and oriented to person, place, and time.  Psychiatric:        Mood and Affect: Mood normal.        Behavior: Behavior normal.          Scheduled Medications:   acidophilus  2 capsule Oral BID   aspirin  81 mg Oral Daily   atorvastatin  40 mg Oral QHS   clobetasol ointment  1 Application Topical Daily   enoxaparin (LOVENOX) injection  40 mg Subcutaneous Q24H   furosemide  40 mg Intravenous Q12H   gabapentin  900 mg Oral TID   ipratropium-albuterol  3 mL Nebulization Q6H   metoprolol succinate  12.5 mg Oral Daily   montelukast  10 mg Oral QHS   nicotine  21 mg Transdermal Daily   potassium chloride  40 mEq Oral BID    Continuous Infusions:  sodium chloride Stopped (08/25/22 2123)   cefTRIAXone (ROCEPHIN)  IV Stopped (08/25/22 2105)    PRN Medications:  sodium chloride, acetaminophen, albuterol, dextromethorphan-guaiFENesin, diphenhydrAMINE, hydrALAZINE, LORazepam, melatonin, methocarbamol, mouth rinse, oxyCODONE-acetaminophen  Antimicrobials from admission:  Anti-infectives (From admission, onward)    Start     Dose/Rate Route Frequency Ordered Stop   08/25/22 1800  cefTRIAXone (ROCEPHIN) 1 g in sodium chloride 0.9 % 100 mL IVPB        1 g 200 mL/hr over 30 Minutes Intravenous Every 24 hours 08/25/22 1702             Data Reviewed:  I have personally reviewed the following...  CBC: Recent Labs  Lab 08/25/22 1229 08/26/22 0445  WBC 11.4* 9.4  HGB 10.3* 9.9*  HCT 31.9* 29.8*  MCV 95.8 94.0  PLT 201 207   Basic Metabolic Panel: Recent Labs  Lab 08/25/22 1229 08/26/22 0445  NA 141 140  K 3.2* 3.0*  CL 105 105   CO2 24 25  GLUCOSE 106* 212*  BUN 18 17  CREATININE 1.05* 0.91  CALCIUM 9.1 9.0  MG 1.8 1.9   GFR: Estimated Creatinine Clearance: 45.8 mL/min (by C-G formula based on SCr of 0.91 mg/dL). Liver Function Tests: No results for input(s): "AST", "ALT", "ALKPHOS", "BILITOT", "PROT", "ALBUMIN" in the last 168 hours. No results for input(s): "LIPASE", "AMYLASE" in the last 168 hours. No results for input(s): "AMMONIA" in the last 168 hours. Coagulation Profile: No results for input(s): "INR", "PROTIME" in the last 168 hours. Cardiac Enzymes: No results for input(s): "CKTOTAL", "CKMB", "CKMBINDEX", "TROPONINI" in  the last 168 hours. BNP (last 3 results) No results for input(s): "PROBNP" in the last 8760 hours. HbA1C: Recent Labs    08/25/22 1532  HGBA1C 5.6   CBG: No results for input(s): "GLUCAP" in the last 168 hours. Lipid Profile: Recent Labs    08/26/22 0445  CHOL 100  HDL 25*  LDLCALC 57  TRIG 88  CHOLHDL 4.0   Thyroid Function Tests: No results for input(s): "TSH", "T4TOTAL", "FREET4", "T3FREE", "THYROIDAB" in the last 72 hours. Anemia Panel: No results for input(s): "VITAMINB12", "FOLATE", "FERRITIN", "TIBC", "IRON", "RETICCTPCT" in the last 72 hours. Most Recent Urinalysis On File:     Component Value Date/Time   COLORURINE STRAW (A) 08/25/2022 1740   APPEARANCEUR CLEAR (A) 08/25/2022 1740   APPEARANCEUR Hazy 12/25/2012 1507   LABSPEC 1.006 08/25/2022 1740   LABSPEC 1.015 12/25/2012 1507   PHURINE 5.0 08/25/2022 1740   GLUCOSEU NEGATIVE 08/25/2022 1740   GLUCOSEU Negative 12/25/2012 1507   HGBUR NEGATIVE 08/25/2022 1740   BILIRUBINUR NEGATIVE 08/25/2022 1740   BILIRUBINUR Negative 06/26/2022 1525   BILIRUBINUR Negative 12/25/2012 1507   KETONESUR 5 (A) 08/25/2022 1740   PROTEINUR NEGATIVE 08/25/2022 1740   UROBILINOGEN 0.2 06/26/2022 1525   NITRITE NEGATIVE 08/25/2022 1740   LEUKOCYTESUR NEGATIVE 08/25/2022 1740   LEUKOCYTESUR 3+ 12/25/2012 1507    Sepsis Labs: @LABRCNTIP (procalcitonin:4,lacticidven:4) Microbiology: Recent Results (from the past 240 hour(s))  SARS Coronavirus 2 by RT PCR (hospital order, performed in [facility] hospital lab) *cepheid single result test* Urine, Clean Catch     Status: None   Collection Time: 08/25/22  4:30 PM   Specimen: Urine, Clean Catch; Nasal Swab  Result Value Ref Range Status   SARS Coronavirus 2 by RT PCR NEGATIVE NEGATIVE Final    Comment: (NOTE) SARS-CoV-2 target nucleic acids are NOT DETECTED.  The SARS-CoV-2 RNA is generally detectable in upper and lower respiratory specimens during the acute phase of infection. The lowest concentration of SARS-CoV-2 viral copies this assay can detect is 250 copies / mL. A negative result does not preclude SARS-CoV-2 infection and should not be used as the sole basis for treatment or other patient management decisions.  A negative result may occur with improper specimen collection / handling, submission of specimen other than nasopharyngeal swab, presence of viral mutation(s) within the areas targeted by this assay, and inadequate number of viral copies (<250 copies / mL). A negative result must be combined with clinical observations, patient history, and epidemiological information.  Fact Sheet for Patients:   RoadLapTop.co.za  Fact Sheet for Healthcare Providers: http://kim-miller.com/  This test is not yet approved or  cleared by the [place] FDA and has been authorized for detection and/or diagnosis of SARS-CoV-2 by FDA under an Emergency Use Authorization (EUA).  This EUA will remain in effect (meaning this test can be used) for the duration of the COVID-19 declaration under Section 564(b)(1) of the Act, 21 U.S.C. section 360bbb-3(b)(1), unless the authorization is terminated or revoked sooner.  Performed at [facility], [address], [place], [place] [address]        Radiology Studies last 3 days: ECHOCARDIOGRAM COMPLETE  Result Date: 08/26/2022    ECHOCARDIOGRAM REPORT   Patient Name:   NYLAN OESTREICH Date of Exam: 08/26/2022 Medical Rec #:  [id number]        Height:       65.0 in Accession #:    [id number]       Weight:  146.2 lb Date of Birth:  04/21/1945        BSA:          1.731 m Patient Age:    11 years         BP:           132/57 mmHg Patient Gender: F                HR:           90 bpm. Exam Location:  [facility] Procedure: 2D Echo, Cardiac Doppler and Color Doppler Indications:     CHF-Acute diastolic I50.31  History:         Patient has prior history of Echocardiogram examinations, most                  recent 03/24/2022. COPD and Stroke; Risk Factors:Hypertension.  Sonographer:     Cristela Blue Referring Phys:  [id number] Brien Few NIU Diagnosing Phys: Lorine Bears MD IMPRESSIONS  1. Left ventricular ejection fraction, by estimation, is 55 to 60%. The left ventricle has normal function. The left ventricle has no regional wall motion abnormalities. There is mild left ventricular hypertrophy. Left ventricular diastolic parameters are indeterminate.  2. Right ventricular systolic function is normal. The right ventricular size is normal. There is severely elevated pulmonary artery systolic pressure.  3. Left atrial size was mild to moderately dilated.  4. Right atrial size was mildly dilated.  5. The mitral valve is normal in structure. Mild mitral valve regurgitation. No evidence of mitral stenosis.  6. Tricuspid valve regurgitation is moderate.  7. The aortic valve is normal in structure. Aortic valve regurgitation is not visualized. Aortic valve sclerosis/calcification is present, without any evidence of aortic stenosis.  8. The inferior vena cava is normal in size with <50% respiratory variability, suggesting right atrial pressure of 8 mmHg. FINDINGS  Left Ventricle: Left ventricular ejection fraction, by estimation, is 55 to 60%. The left ventricle has normal  function. The left ventricle has no regional wall motion abnormalities. The left ventricular internal cavity size was normal in size. There is  mild left ventricular hypertrophy. Left ventricular diastolic parameters are indeterminate. Right Ventricle: The right ventricular size is normal. No increase in right ventricular wall thickness. Right ventricular systolic function is normal. There is severely elevated pulmonary artery systolic pressure. The tricuspid regurgitant velocity is 3.63 m/s, and with an assumed right atrial pressure of 8 mmHg, the estimated right ventricular systolic pressure is 60.7 mmHg. Left Atrium: Left atrial size was mild to moderately dilated. Right Atrium: Right atrial size was mildly dilated. Pericardium: There is no evidence of pericardial effusion. Mitral Valve: The mitral valve is normal in structure. Mild mitral annular calcification. Mild mitral valve regurgitation. No evidence of mitral valve stenosis. MV peak gradient, 7.5 mmHg. The mean mitral valve gradient is 4.0 mmHg. Tricuspid Valve: The tricuspid valve is normal in structure. Tricuspid valve regurgitation is moderate . No evidence of tricuspid stenosis. Aortic Valve: The aortic valve is normal in structure. Aortic valve regurgitation is not visualized. Aortic valve sclerosis/calcification is present, without any evidence of aortic stenosis. Aortic valve mean gradient measures 7.0 mmHg. Aortic valve peak  gradient measures 12.8 mmHg. Aortic valve area, by VTI measures 2.44 cm. Pulmonic Valve: The pulmonic valve was normal in structure. Pulmonic valve regurgitation is not visualized. No evidence of pulmonic stenosis. Aorta: The aortic root is normal in size and structure. Venous: The inferior vena cava is normal in size with less than 50%  respiratory variability, suggesting right atrial pressure of 8 mmHg. IAS/Shunts: No atrial level shunt detected by color flow Doppler.  LEFT VENTRICLE PLAX 2D LVIDd:         3.90 cm    Diastology LVIDs:         2.80 cm   LV e' medial:    7.07 cm/s LV PW:         1.00 cm   LV E/e' medial:  17.7 LV IVS:        1.10 cm   LV e' lateral:   13.90 cm/s LVOT diam:     2.00 cm   LV E/e' lateral: 9.0 LV SV:         81 LV SV Index:   47 LVOT Area:     3.14 cm  RIGHT VENTRICLE RV Basal diam:  3.60 cm RV Mid diam:    2.70 cm LEFT ATRIUM             Index        RIGHT ATRIUM           Index LA diam:        4.80 cm 2.77 cm/m   RA Area:     20.20 cm LA Vol (A2C):   66.7 ml 38.53 ml/m  RA Volume:   57.20 ml  33.04 ml/m LA Vol (A4C):   58.8 ml 33.96 ml/m LA Biplane Vol: 66.2 ml 38.24 ml/m  AORTIC VALVE AV Area (Vmax):    2.26 cm AV Area (Vmean):   2.25 cm AV Area (VTI):     2.44 cm AV Vmax:           179.00 cm/s AV Vmean:          124.500 cm/s AV VTI:            0.332 m AV Peak Grad:      12.8 mmHg AV Mean Grad:      7.0 mmHg LVOT Vmax:         129.00 cm/s LVOT Vmean:        89.300 cm/s LVOT VTI:          0.257 m LVOT/AV VTI ratio: 0.78  AORTA Ao Root diam: 3.03 cm MITRAL VALVE                TRICUSPID VALVE MV Area (PHT): 4.24 cm     TR Peak grad:   52.7 mmHg MV Area VTI:   2.99 cm     TR Vmax:        363.00 cm/s MV Peak grad:  7.5 mmHg MV Mean grad:  4.0 mmHg     SHUNTS MV Vmax:       1.37 m/s     Systemic VTI:  0.26 m MV Vmean:      92.6 cm/s    Systemic Diam: 2.00 cm MV Decel Time: 179 msec MV E velocity: 125.00 cm/s MV A velocity: 95.40 cm/s MV E/A ratio:  1.31 Lorine Bears MD Electronically signed by Lorine Bears MD Signature Date/Time: 08/26/2022/11:32:20 AM    Final    DG Chest Portable 1 View  Result Date: 08/25/2022 CLINICAL DATA:  Shortness of breath EXAM: PORTABLE CHEST 1 VIEW COMPARISON:  X-ray 03/24/2022 FINDINGS: Normal cardiopericardial silhouette with a calcified aorta. Diffuse interstitial changes. No pneumothorax, effusion or consolidation. Overlapping cardiac leads. Fixation hardware along the lower cervical spine. Calcified aorta. Hyperinflation. IMPRESSION: Hyperinflation.  Interstitial changes. Increased from previous. Acute process is possible. Recommend follow-up  Electronically Signed   By: Karen Kays M.D.   On: 08/25/2022 13:56             LOS: 1 day     Sunnie Nielsen, DO [facility] 08/26/2022, 2:58 PM    Dictation software may have been used to generate the above note. Typos may occur and escape review in typed/dictated notes. Please contact Dr Lyn Hollingshead directly for clarity if needed.  Staff may message me via secure chat in Epic  but this may not receive an immediate response,  please page me for urgent matters!  If 7PM-7AM, please contact night coverage [email or web address]

## 2022-08-26 NOTE — Evaluation (Signed)
Physical Therapy Evaluation Patient Details Name: Beth [name] MRN: [id number] DOB: Mar 07, 1945 Today's Date: 08/26/2022  History of Present Illness  Pt is a 77 y.o. female with medical history significant of dCHF, COPD on 3L of O2 only at night, HTN, HJLD, CAD, stroke, depression with anxiety, CKD stage IIIa, chronic pain and fibromyalgia, IBS, pulmonary hypertension, who presents with shortness of breath and dysuria.  MD assessment includes: acute on chronic diastolic CHF, severe pulmonary arterial systolic hypertension, severe COPD, myocardial injury likely due to demand ischemia, hypokalemia, and UTI.   Clinical Impression  Pt was pleasant and motivated to participate during the session and put forth good effort throughout. Pt found on 3LO2/min with SpO2 89% and HR 100 bpm.  Pt required extra time and effort with bed mobility tasks and transfers but no physical assist.  Pt was able to amb 20 feet with a RW with min instability but no physical assistance needed.  Pt with min to mod SOB after amb with SpO2 dropping to a low of 80% and HR in the low 100s on 3L, returned to baseline levels quickly upon sitting.  Per conversation with nursing OK for trial of ambulation on increased O2.  Pt then ambulated 20 feet with RW on 4L with SpO2 dropping to a low of 83% again with min to mod SOB but subjectively felt better than on 3L per pt report.  Nursing/MD notified of above.  Pt with a history of multiple falls in the last 6 months and presented with min instability with gait this date.  Pt is at an elevated fall risk and will benefit from continued PT services upon discharge to safely address deficits listed in patient problem list for decreased caregiver assistance and eventual return to PLOF.          If plan is discharge home, recommend the following: A little help with walking and/or transfers;A little help with bathing/dressing/bathroom;Assistance with cooking/housework;Assist for transportation    Can travel by private vehicle   Yes    Equipment Recommendations Other (comment) (TBD at next venue of care)  Recommendations for Other Services       Functional Status Assessment Patient has had a recent decline in their functional status and demonstrates the ability to make significant improvements in function in a reasonable and predictable amount of time.     Precautions / Restrictions Precautions Precautions: Fall Restrictions Weight Bearing Restrictions: No      Mobility  Bed Mobility Overal bed mobility: Modified Independent             General bed mobility comments: Min extra time and effort only    Transfers Overall transfer level: Needs assistance Equipment used: Rolling walker (2 wheels) Transfers: Sit to/from Stand Sit to Stand: Min guard           General transfer comment: Min extra effort to come to standing but no physical assist needed    Ambulation/Gait Ambulation/Gait assistance: Min guard Gait Distance (Feet): 20 Feet x 2 Assistive device: Rolling walker (2 wheels) Gait Pattern/deviations: Step-through pattern, Decreased step length - right, Decreased step length - left, Trunk flexed Gait velocity: decreased     General Gait Details: Very slow, effortul cadence with short B step length and min instability that the pt was able to self-correct  Stairs            Wheelchair Mobility     Tilt Bed    Modified Rankin (Stroke Patients Only)  Balance Overall balance assessment: Needs assistance Sitting-balance support: No upper extremity supported, Feet supported Sitting balance-Leahy Scale: Normal     Standing balance support: During functional activity, Reliant on assistive device for balance, Bilateral upper extremity supported Standing balance-Leahy Scale: Fair                               Pertinent Vitals/Pain Pain Assessment Pain Assessment: 0-10 Pain Score: 4  Pain Location: Bilateral  hips Pain Descriptors / Indicators: Sore Pain Intervention(s): Premedicated before session, Monitored during session, Repositioned    Home Living Family/patient expects to be discharged to:: Private residence Living Arrangements: Alone Available Help at Discharge: Family;Available PRN/intermittently Type of Home: Apartment Home Access: Level entry       Home Layout: One level Home Equipment: Rollator (4 wheels);Cane - single point;Grab bars - tub/shower      Prior Function Prior Level of Function : Independent/Modified Independent;Driving;History of Falls (last six months)             Mobility Comments: Mod Ind amb community distances with a rollator, 4 falls in the last 6 months from tripping/LOB ADLs Comments: Ind with ADLs, cares for her small dog     Hand Dominance   Dominant Hand: Right    Extremity/Trunk Assessment   Upper Extremity Assessment Upper Extremity Assessment: Defer to OT evaluation    Lower Extremity Assessment Lower Extremity Assessment: Generalized weakness       Communication   Communication: No difficulties  Cognition Arousal/Alertness: Awake/alert Behavior During Therapy: WFL for tasks assessed/performed Overall Cognitive Status: Within Functional Limits for tasks assessed                                          General Comments      Exercises     Assessment/Plan    PT Assessment Patient needs continued PT services  PT Problem List Decreased strength;Decreased activity tolerance;Decreased balance;Decreased mobility;Decreased knowledge of use of DME;Pain       PT Treatment Interventions DME instruction;Gait training;Functional mobility training;Therapeutic activities;Therapeutic exercise;Balance training;Patient/family education    PT Goals (Current goals can be found in the Care Plan section)  Acute Rehab PT Goals Patient Stated Goal: To get stronger and return home to my dog PT Goal Formulation: With  patient Time For Goal Achievement: 09/08/22 Potential to Achieve Goals: Good    Frequency Min 1X/week     Co-evaluation               AM-PAC PT "6 Clicks" Mobility  Outcome Measure Help needed turning from your back to your side while in a flat bed without using bedrails?: None Help needed moving from lying on your back to sitting on the side of a flat bed without using bedrails?: None Help needed moving to and from a bed to a chair (including a wheelchair)?: A Little Help needed standing up from a chair using your arms (e.g., wheelchair or bedside chair)?: A Little Help needed to walk in hospital room?: A Little Help needed climbing 3-5 steps with a railing? : A Lot 6 Click Score: 19    End of Session Equipment Utilized During Treatment: Gait belt;Oxygen Activity Tolerance: Other (comment) (limited by O2 desaturation and fatigue/SOB) Patient left: in bed;with call bell/phone within reach;with bed alarm set Nurse Communication: Mobility status;Other (comment) (SpO2 results with activity  per above) PT Visit Diagnosis: Unsteadiness on feet (R26.81);History of falling (Z91.81);Difficulty in walking, not elsewhere classified (R26.2);Muscle weakness (generalized) (M62.81);Pain Pain - Right/Left:  (bilateral) Pain - part of body: Hip    Time: 1017-5102 PT Time Calculation (min) (ACUTE ONLY): 24 min   Charges:   PT Evaluation $PT Eval Moderate Complexity: 1 Mod PT Treatments $Gait Training: 8-22 mins PT General Charges $$ ACUTE PT VISIT: 1 Visit       D. Elly Modena PT, DPT 08/26/22, 3:39 PM

## 2022-08-26 NOTE — Progress Notes (Signed)
Transition of Care [facility]) - Inpatient Brief Assessment   Patient Details  Name: Beth [name] MRN: [id number] Date of Birth: Jun 20, 1945  Transition of Care [facility]) CM/SW Contact:    Darolyn Rua, LCSW Phone Number: 08/26/2022, 12:02 PM   Clinical Narrative:  Patient presents to ED from home with SOB and heart racing, hx of CHF. Patient baseline 3L nocturnal oxygen.  PCP Karamalegos. Patient has [employer].   Please consult TOC should discharge planning needs arise.   Transition of Care Asessment: Insurance and Status: Insurance coverage has been reviewed Patient has primary care physician: Yes Home environment has been reviewed: from home Prior level of function:: independent Prior/Current Home Services: No current home services Social Determinants of Health Reivew: SDOH reviewed no interventions necessary Readmission risk has been reviewed: Yes Transition of care needs: no transition of care needs at this time

## 2022-08-26 NOTE — Progress Notes (Signed)
*  PRELIMINARY RESULTS* Echocardiogram 2D Echocardiogram has been performed.  Beth [name] 08/26/2022, 9:23 AM

## 2022-08-27 DIAGNOSIS — I5033 Acute on chronic diastolic (congestive) heart failure: Secondary | ICD-10-CM | POA: Diagnosis not present

## 2022-08-27 LAB — BASIC METABOLIC PANEL WITH GFR
Anion gap: 7 (ref 5–15)
BUN: 17 mg/dL (ref 8–23)
CO2: 29 mmol/L (ref 22–32)
Calcium: 8.6 mg/dL — ABNORMAL LOW (ref 8.9–10.3)
Chloride: 104 mmol/L (ref 98–111)
Creatinine, Ser: 0.89 mg/dL (ref 0.44–1.00)
GFR, Estimated: >60 mL/min (ref >60)
Glucose, Bld: 105 mg/dL — ABNORMAL HIGH (ref 70–99)
Potassium: 3.5 mmol/L (ref 3.5–5.1)
Sodium: 140 mmol/L (ref 135–145)

## 2022-08-27 NOTE — Consult Note (Signed)
   [facility] Inpatient Consult   08/27/2022  RAHA SIRBAUGH 03/07/1945 [id number]  Primary Care Provider:  Saralyn Pilar  Patient is currently active with Care Management for chronic disease management services.  Patient has been engaged by a  care coordinator-Chronic Care Management.  Our community based plan of care has focused on disease management and community resource support.   Patient will receive a post hospital call and will be evaluated for assessments and disease process education.   Plan: Pending discharge disposition  Inpatient Transition Of Care [TOC] team member to make aware that Care Management following.  Of note, Care Management services does not replace or interfere with any services that are needed or arranged by inpatient [facility] care management team.   For additional questions or referrals please contact:  Elliot Cousin, RN, [facility] Liaison [place]   Population Health Office Hours MTWF  8:00 am-6:00 pm Off on Thursday [phone] mobile [phone] [Office toll free line] Office Hours are M-F 8:30 - 5 pm 24 hour nurse advise line [phone] Concierge  [email or web address]

## 2022-08-27 NOTE — TOC Initial Note (Signed)
Transition of Care [facility]) - Initial/Assessment Note    Patient Details  Name: Beth [name] MRN: [id number] Date of Birth: September 09, 1945  Transition of Care [facility]) CM/SW Contact:    Darolyn Rua, LCSW Phone Number: 08/27/2022, 9:28 AM  Clinical Narrative:                  CSW spoke with patient regarding SNF recommendations. She reports that she does not want to go to snf that she wants to go home at time of discharge, reports having a dog at home she wants to get back to. Reports she does have a friend who can visit and care for her if needed at discharge. She has a RW at home and is interested in getting 3in1, ordered via [employer] to be delivered to bedside. Patient reports being agreeable to home health [facility] of [facility], CSW has reached out to [place] with [facility] to see if they can accept again. Pending response.   Patient confirms PCP Dr. Althea Charon and Pharmacy is [facility] in [place].    Expected Discharge Plan: Home w Home Health Services Barriers to Discharge: Continued Medical Work up   Patient Goals and CMS Choice Patient states their goals for this hospitalization and ongoing recovery are:: to go home CMS Medicare.gov Compare Post Acute Care list provided to:: Patient Choice offered to / list presented to : Patient      Expected Discharge Plan and Services       Living arrangements for the past 2 months: Single Family Home                 DME Arranged: 3-N-1 DME Agency: [employer] Date DME Agency Contacted: 08/27/22       HH Agency: [facility] Date HH Agency Contacted: 08/27/22   Representative spoke with at [facility] Agency: kelsey  Prior Living Arrangements/Services Living arrangements for the past 2 months: Single Family Home Lives with:: Self   Do you feel safe going back to the place where you live?: Yes               Activities of Daily Living Home Assistive Devices/Equipment: Environmental consultant (specify type) ADL Screening (condition at time of  admission) Patient's cognitive ability adequate to safely complete daily activities?: Yes Is the patient deaf or have difficulty hearing?: No Does the patient have difficulty seeing, even when wearing glasses/contacts?: No Does the patient have difficulty concentrating, remembering, or making decisions?: No Patient able to express need for assistance with ADLs?: Yes Does the patient have difficulty dressing or bathing?: No Independently performs ADLs?: Yes (appropriate for developmental age) Does the patient have difficulty walking or climbing stairs?: Yes Weakness of Legs: Both Weakness of Arms/Hands: None  Permission Sought/Granted                  Emotional Assessment   Attitude/Demeanor/Rapport: Gracious Affect (typically observed): Calm Orientation: : Oriented to Self, Oriented to Place, Oriented to  Time, Oriented to Situation Alcohol / Substance Use: Not Applicable Psych Involvement: No (comment)  Admission diagnosis:  Hypoxia [R09.02] Acute on chronic diastolic CHF (congestive heart failure) (HCC) [I50.33] Dyspnea, unspecified type [R06.00] Acute congestive heart failure, unspecified heart failure type [facility]) [I50.9] Patient Active Problem List   Diagnosis Date Noted   Acute on chronic diastolic CHF (congestive heart failure) (HCC) 08/25/2022   Stroke (HCC) 08/25/2022   HTN (hypertension) 08/25/2022   HLD (hyperlipidemia) 08/25/2022   Chronic kidney disease, stage 3a (HCC) 08/25/2022   Hypokalemia 08/25/2022  Myocardial injury 08/25/2022   Prolonged QT interval 08/25/2022   UTI (urinary tract infection) 08/25/2022   Acute left-sided weakness 03/24/2022   Anxiety and depression 03/24/2022   Asthma with COPD 03/24/2022   TIA (transient ischemic attack) 03/24/2022   Multiple falls 03/24/2022   Severe pulmonary arterial systolic hypertension (HCC) 03/24/2022   Moderate mitral regurgitation 03/24/2022   Moderate tricuspid regurgitation 03/24/2022   Moderate  aortic valve stenosis 03/24/2022   Chronic diastolic congestive heart failure (HCC) 11/14/2020   Rash 01/14/2020   Pulmonary hypertension (HCC) 09/14/2019   Coccydynia 07/29/2019   Cervicalgia 05/04/2019   Acute pain of left shoulder 05/04/2019   Elbow pain, left 05/04/2019   DOE (dyspnea on exertion) 03/23/2019   Leg swelling 03/22/2019    Class: Acute   Lumbar degenerative disc disease 09/10/2018   Left hip pain 09/10/2018   Post-traumatic osteoarthritis of left elbow 07/14/2018   Lumbar radiculopathy 02/12/2018   Lumbar spondylosis 02/12/2018   Lumbar facet arthropathy 02/12/2018   Atherosclerosis of aorta (HCC) 09/17/2017   Lung nodule 09/17/2017   Urinary tract infection 12/05/2016   Protein-calorie malnutrition, mild (HCC) 08/07/2016   Generalized anxiety disorder 07/15/2016   Chronic pain syndrome 07/15/2016   Severe episode of recurrent major depressive disorder, without psychotic features (HCC) 07/14/2016   Moderate benzodiazepine use disorder (HCC) 05/08/2016   Major depressive disorder, recurrent, severe w/o psychotic behavior (HCC) 05/01/2016   Seasonal allergic rhinitis 03/23/2015   Hyperglycemia 03/23/2015   Senile purpura (HCC) 03/23/2015   Perennial allergic rhinitis with seasonal variation 03/23/2015   Marital problems 10/31/2014   Migraine without aura and without status migrainosus, not intractable 10/31/2014   Myofascial pain syndrome of lumbar spine 08/09/2014   Colon polyp 08/09/2014   COPD, severe (HCC) 08/09/2014   CVA, old, hemiparesis (HCC) 08/09/2014   Dyslipidemia 08/09/2014   Dysfunction of eustachian tube 08/09/2014   Fibromyalgia syndrome 08/09/2014   Gastro-esophageal reflux disease without esophagitis 08/09/2014   Benign migrating glossitis 08/09/2014   Cerebrovascular accident, old 08/09/2014   IBS (irritable bowel syndrome) 08/09/2014   Low back pain with radiation 08/09/2014   Chronic recurrent major depressive disorder (HCC) 08/09/2014    Dysmetabolic syndrome 08/09/2014   OP (osteoporosis) 08/09/2014   Vitamin D deficiency 08/09/2014   Smoking 08/09/2014   Benign hypertension 07/19/2013   Benign neoplasm of skin of trunk 06/03/2013   H/O: pneumonia 09/25/2012   PCP:  Smitty Cords, DO Pharmacy:   [facility] [phone] Cheree Ditto, [place] - [address] AT [facility] OF [address] [place] [address] [place] [address] Phone: [phone] Fax: [phone]     Social Determinants of Health (SDOH) Social History: SDOH Screenings   Food Insecurity: No Food Insecurity (08/25/2022)  Housing: Low Risk  (08/25/2022)  Transportation Needs: No Transportation Needs (08/25/2022)  Utilities: Not At Risk (08/25/2022)  Alcohol Screen: Low Risk  (02/01/2022)  Depression (PHQ2-9): High Risk (05/13/2022)  Financial Resource Strain: Low Risk  (02/01/2022)  Physical Activity: Insufficiently Active (02/01/2022)  Social Connections: Socially Isolated (02/01/2022)  Stress: No Stress Concern Present (02/01/2022)  Tobacco Use: High Risk (08/25/2022)   SDOH Interventions:     Readmission Risk Interventions     No data to display

## 2022-08-27 NOTE — Progress Notes (Signed)
Occupational Therapy Treatment Patient Details Name: Beth [name] MRN: [id number] DOB: Aug 24, 1945 Today's Date: 08/27/2022   History of present illness Pt is a 77 y.o. female with medical history significant of dCHF, COPD on 3L of O2 only at night, HTN, HJLD, CAD, stroke, depression with anxiety, CKD stage IIIa, chronic pain and fibromyalgia, IBS, pulmonary hypertension, who presents with shortness of breath and dysuria.  MD assessment includes: acute on chronic diastolic CHF, severe pulmonary arterial systolic hypertension, severe COPD, myocardial injury likely due to demand ischemia, hypokalemia, and UTI.   OT comments  Upon entering the room, pt supine in bed and agreeable to OT intervention. Pt reports she would like to brush her teeth at sink. Pt performs bed mobility without assistance. Pt stands and takes several steps with RW to sink for grooming tasks in standing with supervision overall. Pt takes seated rest break and then ambulates 25' within the room with RW and supervision. Pt on 4Ls O2 via [place] with mobility tasks to remain above 90 % and placed back on 3Ls once returning to supine. Call bell and all needed items within reach. Pt is motivated to return home to her dog.   Recommendations for follow up therapy are one component of a multi-disciplinary discharge planning process, led by the attending physician.  Recommendations may be updated based on patient status, additional functional criteria and insurance authorization.    Assistance Recommended at Discharge Intermittent Supervision/Assistance  Patient can return home with the following  A little help with bathing/dressing/bathroom;Help with stairs or ramp for entrance   Equipment Recommendations  BSC/3in1       Precautions / Restrictions Precautions Precautions: Fall       Mobility Bed Mobility Overal bed mobility: Modified Independent                  Transfers Overall transfer level: Needs  assistance Equipment used: Rolling walker (2 wheels) Transfers: Sit to/from Stand Sit to Stand: Min guard                 Balance Overall balance assessment: Needs assistance Sitting-balance support: No upper extremity supported, Feet supported Sitting balance-Leahy Scale: Normal     Standing balance support: During functional activity, Reliant on assistive device for balance, Bilateral upper extremity supported Standing balance-Leahy Scale: Fair                             ADL either performed or assessed with clinical judgement   ADL Overall ADL's : Needs assistance/impaired     Grooming: Wash/dry hands;Wash/dry face;Oral care;Standing;Supervision/safety                                      Extremity/Trunk Assessment Upper Extremity Assessment Upper Extremity Assessment: Defer to OT evaluation   Lower Extremity Assessment Lower Extremity Assessment: Generalized weakness        Vision Patient Visual Report: No change from baseline            Cognition Arousal/Alertness: Awake/alert Behavior During Therapy: WFL for tasks assessed/performed Overall Cognitive Status: Within Functional Limits for tasks assessed  Pertinent Vitals/ Pain       Pain Assessment Pain Assessment: 0-10 Pain Score: 4  Pain Location: Bilateral hips Pain Descriptors / Indicators: Sore Pain Intervention(s): Monitored during session, Repositioned         Frequency  Min 1X/week        Progress Toward Goals  OT Goals(current goals can now be found in the care plan section)  Progress towards OT goals: Progressing toward goals     Plan Discharge plan remains appropriate;Frequency remains appropriate       AM-PAC OT "6 Clicks" Daily Activity     Outcome Measure   Help from another person eating meals?: None Help from another person taking care of personal grooming?: A  Little Help from another person toileting, which includes using toliet, bedpan, or urinal?: A Little Help from another person bathing (including washing, rinsing, drying)?: A Little Help from another person to put on and taking off regular upper body clothing?: None Help from another person to put on and taking off regular lower body clothing?: None 6 Click Score: 21    End of Session Equipment Utilized During Treatment: Rolling walker (2 wheels);Oxygen  OT Visit Diagnosis: Other abnormalities of gait and mobility (R26.89);Muscle weakness (generalized) (M62.81)   Activity Tolerance Patient tolerated treatment well   Patient Left in bed;with bed alarm set;with call bell/phone within reach   Nurse Communication Mobility status        Time: 3474-2595 OT Time Calculation (min): 20 min  Charges: OT General Charges $OT Visit: 1 Visit OT Treatments $Self Care/Home Management : 8-22 mins  Jackquline Denmark, MS, OTR/L , CBIS ascom [phone]  08/27/22, 12:38 PM

## 2022-08-27 NOTE — Progress Notes (Addendum)
PROGRESS NOTE    [name]   ZOX:[id number] DOB: 1945-04-20  DOA: 08/25/2022 Date of Service: 08/27/22 PCP: Smitty Cords, DO     Brief Narrative / Hospital Course:   Beth [name] is a 77 y.o. female with medical history significant of dCHF, COPD on 3L of O2 only at night, HTN, HJLD, CAD, stroke, depression with anxiety, CKD stage IIIa, chronic pain and fibromyalgia, IBS, pulmonary hypertension, who presents with shortness of breath and dysuria.  07/28: admitted w/ HFpEF exacerbation, UTI.  07/29: still fluid overload, await cultures, desats on room air and SNF recommended but pt/family prefer HH. Echo EF 55-60%, indeterminate diastolic  07/30: still SOB on ambulation, (+)rales on exam. Continue diuresis   Consultants:  none  Procedures: none      ASSESSMENT & PLAN:   Principal Problem:   Acute on chronic diastolic CHF (congestive heart failure) (HCC) Active Problems:   Severe pulmonary arterial systolic hypertension (HCC)   COPD, severe (HCC)   Myocardial injury   Stroke (HCC)   HLD (hyperlipidemia)   HTN (hypertension)   Chronic kidney disease, stage 3a (HCC)   Hypokalemia   Prolonged QT interval   UTI (urinary tract infection)   Anxiety and depression   Smoking    Acute on chronic diastolic CHF (congestive heart failure) in [facility]): Patient has 1+ leg edema, elevated BNP 1086, positive JVD, crackles on auscultation, interstitial pulmonary edema on chest x-ray, clinically consistent with CHF exacerbation.  2D echo on 10/11/2020 showed EF of 55-60% with grade 1 diastolic dysfunction. Echo this admission EF 55-60%, indeterminate diastolic  Lasix 40 mg bid by IV Daily weights strict I/O's Low salt diet Fluid restriction   Severe pulmonary arterial systolic hypertension (HCC): on IV lasix as above   COPD, severe (HCC): Currently no wheezing or rhonchi auscultation to me.  Patient received 1 dose of Solu-Medrol in  ED. Bronchodilators Singulair As needed Mucinex   Myocardial injury:  Troponin level 85. likely due to demand ischemia Trend troponin --> down-trending  Aspirin, Lipitor Check A1c, FLP   Stroke (HCC) Aspirin, Lipitor   HLD (hyperlipidemia) Lipitor   HTN (hypertension) IV hydralazine as needed Metoprolol   Chronic kidney disease, stage 3a (HCC):  Renal function stable.  Baseline creatinine 1.11 on 03/24/2022.  Her creatinine is 1.05, BUN 18, GFR 54 Follow up with BMP   Hypokalemia: Potassium 3.2 Repeat potassium Check magnesium level --> 1.8   Prolonged QT interval: QTc 553 Hold Prozac and Seroquel   UTI (urinary tract infection) vs vaginitis  Recent UCX mixed flora  3 days Rocephin Follow outpatient w/ GYN   Anxiety and depression Hold Prozac and Seroquel As needed Ativan   Smoking Nicotine patch     DVT prophylaxis: lovenox  Pertinent IV fluids/nutrition: no IV fluids, cardiac fluid restricted diet  Central lines / invasive devices: none  Code Status: FULL CODE ACP documentation reviewed: 08/26/22 none on file   Current Admission Status: inpatient   TOC needs / Dispo plan: PT recs for SNF but pt/family request HH, TOC waiting to hear back re: [facility] acceptance  Barriers to discharge / significant pending items: clinical improvement, continue diuresing             Subjective / Brief ROS:  Patient reports breathing about the same today, SOB on exertion  Denies CP.  Pain controlled.  Denies new weakness.  Tolerating diet.  Reports no concerns w/ urination/defecation.   Family Communication: none at this time  Objective Findings:  Vitals:   08/27/22 0718 08/27/22 0825 08/27/22 1144 08/27/22 1340  BP:  (!) 143/72 132/60   Pulse:  89 90   Resp:  18    Temp:  98.3 F (36.8 C) 99 F (37.2 C)   TempSrc:   Oral   SpO2: 94% 91% 95% 96%  Weight:      Height:        Intake/Output Summary (Last 24 hours) at 08/27/2022 1534 Last data  filed at 08/27/2022 1146 Gross per 24 hour  Intake 1200 ml  Output 1700 ml  Net -500 ml   Filed Weights   08/25/22 1227 08/26/22 0348  Weight: 62.6 kg 66.3 kg    Examination:  Physical Exam Constitutional:      General: She is not in acute distress. Cardiovascular:     Rate and Rhythm: Normal rate and regular rhythm.     Heart sounds: Murmur heard.  Pulmonary:     Effort: Pulmonary effort is normal.     Breath sounds: Examination of the right-lower field reveals rales. Examination of the left-lower field reveals rales. Rales present.  Musculoskeletal:     Right lower leg: No edema.     Left lower leg: No edema.  Neurological:     General: No focal deficit present.     Mental Status: She is alert and oriented to person, place, and time.  Psychiatric:        Mood and Affect: Mood normal.        Behavior: Behavior normal.          Scheduled Medications:   acidophilus  2 capsule Oral BID   aspirin  81 mg Oral Daily   atorvastatin  40 mg Oral QHS   clobetasol ointment  1 Application Topical Daily   enoxaparin (LOVENOX) injection  40 mg Subcutaneous Q24H   furosemide  40 mg Intravenous Q12H   gabapentin  900 mg Oral TID   ipratropium-albuterol  3 mL Nebulization Q6H   metoprolol succinate  12.5 mg Oral Daily   montelukast  10 mg Oral QHS   nicotine  21 mg Transdermal Daily    Continuous Infusions:  sodium chloride Stopped (08/25/22 2123)   cefTRIAXone (ROCEPHIN)  IV 1 g (08/26/22 1735)    PRN Medications:  sodium chloride, acetaminophen, albuterol, dextromethorphan-guaiFENesin, diphenhydrAMINE, hydrALAZINE, LORazepam, melatonin, methocarbamol, mouth rinse, oxyCODONE-acetaminophen  Antimicrobials from admission:  Anti-infectives (From admission, onward)    Start     Dose/Rate Route Frequency Ordered Stop   08/25/22 1800  cefTRIAXone (ROCEPHIN) 1 g in sodium chloride 0.9 % 100 mL IVPB        1 g 200 mL/hr over 30 Minutes Intravenous Every 24 hours 08/25/22  1702 08/28/22 1759           Data Reviewed:  I have personally reviewed the following...  CBC: Recent Labs  Lab 08/25/22 1229 08/26/22 0445  WBC 11.4* 9.4  HGB 10.3* 9.9*  HCT 31.9* 29.8*  MCV 95.8 94.0  PLT 201 207   Basic Metabolic Panel: Recent Labs  Lab 08/25/22 1229 08/26/22 0445 08/27/22 0459  NA 141 140 140  K 3.2* 3.0* 3.5  CL 105 105 104  CO2 24 25 29   GLUCOSE 106* 212* 105*  BUN 18 17 17   CREATININE 1.05* 0.91 0.89  CALCIUM 9.1 9.0 8.6*  MG 1.8 1.9  --    GFR: Estimated Creatinine Clearance: 46.9 mL/min (by C-G formula based on SCr of 0.89 mg/dL). Liver Function Tests:  No results for input(s): "AST", "ALT", "ALKPHOS", "BILITOT", "PROT", "ALBUMIN" in the last 168 hours. No results for input(s): "LIPASE", "AMYLASE" in the last 168 hours. No results for input(s): "AMMONIA" in the last 168 hours. Coagulation Profile: No results for input(s): "INR", "PROTIME" in the last 168 hours. Cardiac Enzymes: No results for input(s): "CKTOTAL", "CKMB", "CKMBINDEX", "TROPONINI" in the last 168 hours. BNP (last 3 results) No results for input(s): "PROBNP" in the last 8760 hours. HbA1C: Recent Labs    08/25/22 1532  HGBA1C 5.6   CBG: No results for input(s): "GLUCAP" in the last 168 hours. Lipid Profile: Recent Labs    08/26/22 0445  CHOL 100  HDL 25*  LDLCALC 57  TRIG 88  CHOLHDL 4.0   Thyroid Function Tests: No results for input(s): "TSH", "T4TOTAL", "FREET4", "T3FREE", "THYROIDAB" in the last 72 hours. Anemia Panel: No results for input(s): "VITAMINB12", "FOLATE", "FERRITIN", "TIBC", "IRON", "RETICCTPCT" in the last 72 hours. Most Recent Urinalysis On File:     Component Value Date/Time   COLORURINE STRAW (A) 08/25/2022 1740   APPEARANCEUR CLEAR (A) 08/25/2022 1740   APPEARANCEUR Hazy 12/25/2012 1507   LABSPEC 1.006 08/25/2022 1740   LABSPEC 1.015 12/25/2012 1507   PHURINE 5.0 08/25/2022 1740   GLUCOSEU NEGATIVE 08/25/2022 1740   GLUCOSEU  Negative 12/25/2012 1507   HGBUR NEGATIVE 08/25/2022 1740   BILIRUBINUR NEGATIVE 08/25/2022 1740   BILIRUBINUR Negative 06/26/2022 1525   BILIRUBINUR Negative 12/25/2012 1507   KETONESUR 5 (A) 08/25/2022 1740   PROTEINUR NEGATIVE 08/25/2022 1740   UROBILINOGEN 0.2 06/26/2022 1525   NITRITE NEGATIVE 08/25/2022 1740   LEUKOCYTESUR NEGATIVE 08/25/2022 1740   LEUKOCYTESUR 3+ 12/25/2012 1507   Sepsis Labs: @LABRCNTIP (procalcitonin:4,lacticidven:4) Microbiology: Recent Results (from the past 240 hour(s))  SARS Coronavirus 2 by RT PCR (hospital order, performed in [facility] hospital lab) *cepheid single result test* Urine, Clean Catch     Status: None   Collection Time: 08/25/22  4:30 PM   Specimen: Urine, Clean Catch; Nasal Swab  Result Value Ref Range Status   SARS Coronavirus 2 by RT PCR NEGATIVE NEGATIVE Final    Comment: (NOTE) SARS-CoV-2 target nucleic acids are NOT DETECTED.  The SARS-CoV-2 RNA is generally detectable in upper and lower respiratory specimens during the acute phase of infection. The lowest concentration of SARS-CoV-2 viral copies this assay can detect is 250 copies / mL. A negative result does not preclude SARS-CoV-2 infection and should not be used as the sole basis for treatment or other patient management decisions.  A negative result may occur with improper specimen collection / handling, submission of specimen other than nasopharyngeal swab, presence of viral mutation(s) within the areas targeted by this assay, and inadequate number of viral copies (<250 copies / mL). A negative result must be combined with clinical observations, patient history, and epidemiological information.  Fact Sheet for Patients:   RoadLapTop.co.za  Fact Sheet for Healthcare Providers: http://kim-miller.com/  This test is not yet approved or  cleared by the [place] FDA and has been authorized for detection and/or diagnosis of  SARS-CoV-2 by FDA under an Emergency Use Authorization (EUA).  This EUA will remain in effect (meaning this test can be used) for the duration of the COVID-19 declaration under Section 564(b)(1) of the Act, 21 U.S.C. section 360bbb-3(b)(1), unless the authorization is terminated or revoked sooner.  Performed at [facility], [address], [place], [place] [address]       Radiology Studies last 3 days: ECHOCARDIOGRAM COMPLETE  Result  Date: 08/26/2022    ECHOCARDIOGRAM REPORT   Patient Name:   DOREATHEA MAKLEY Date of Exam: 08/26/2022 Medical Rec #:  [id number]        Height:       65.0 in Accession #:    [id number]       Weight:       146.2 lb Date of Birth:  06/10/1945        BSA:          1.731 m Patient Age:    33 years         BP:           132/57 mmHg Patient Gender: F                HR:           90 bpm. Exam Location:  [facility] Procedure: 2D Echo, Cardiac Doppler and Color Doppler Indications:     CHF-Acute diastolic I50.31  History:         Patient has prior history of Echocardiogram examinations, most                  recent 03/24/2022. COPD and Stroke; Risk Factors:Hypertension.  Sonographer:     Cristela Blue Referring Phys:  [id number] Brien Few NIU Diagnosing Phys: Lorine Bears MD IMPRESSIONS  1. Left ventricular ejection fraction, by estimation, is 55 to 60%. The left ventricle has normal function. The left ventricle has no regional wall motion abnormalities. There is mild left ventricular hypertrophy. Left ventricular diastolic parameters are indeterminate.  2. Right ventricular systolic function is normal. The right ventricular size is normal. There is severely elevated pulmonary artery systolic pressure.  3. Left atrial size was mild to moderately dilated.  4. Right atrial size was mildly dilated.  5. The mitral valve is normal in structure. Mild mitral valve regurgitation. No evidence of mitral stenosis.  6. Tricuspid valve regurgitation is moderate.  7. The aortic valve is normal in  structure. Aortic valve regurgitation is not visualized. Aortic valve sclerosis/calcification is present, without any evidence of aortic stenosis.  8. The inferior vena cava is normal in size with <50% respiratory variability, suggesting right atrial pressure of 8 mmHg. FINDINGS  Left Ventricle: Left ventricular ejection fraction, by estimation, is 55 to 60%. The left ventricle has normal function. The left ventricle has no regional wall motion abnormalities. The left ventricular internal cavity size was normal in size. There is  mild left ventricular hypertrophy. Left ventricular diastolic parameters are indeterminate. Right Ventricle: The right ventricular size is normal. No increase in right ventricular wall thickness. Right ventricular systolic function is normal. There is severely elevated pulmonary artery systolic pressure. The tricuspid regurgitant velocity is 3.63 m/s, and with an assumed right atrial pressure of 8 mmHg, the estimated right ventricular systolic pressure is 60.7 mmHg. Left Atrium: Left atrial size was mild to moderately dilated. Right Atrium: Right atrial size was mildly dilated. Pericardium: There is no evidence of pericardial effusion. Mitral Valve: The mitral valve is normal in structure. Mild mitral annular calcification. Mild mitral valve regurgitation. No evidence of mitral valve stenosis. MV peak gradient, 7.5 mmHg. The mean mitral valve gradient is 4.0 mmHg. Tricuspid Valve: The tricuspid valve is normal in structure. Tricuspid valve regurgitation is moderate . No evidence of tricuspid stenosis. Aortic Valve: The aortic valve is normal in structure. Aortic valve regurgitation is not visualized. Aortic valve sclerosis/calcification is present, without any evidence of aortic stenosis. Aortic valve mean gradient measures 7.0  mmHg. Aortic valve peak  gradient measures 12.8 mmHg. Aortic valve area, by VTI measures 2.44 cm. Pulmonic Valve: The pulmonic valve was normal in structure.  Pulmonic valve regurgitation is not visualized. No evidence of pulmonic stenosis. Aorta: The aortic root is normal in size and structure. Venous: The inferior vena cava is normal in size with less than 50% respiratory variability, suggesting right atrial pressure of 8 mmHg. IAS/Shunts: No atrial level shunt detected by color flow Doppler.  LEFT VENTRICLE PLAX 2D LVIDd:         3.90 cm   Diastology LVIDs:         2.80 cm   LV e' medial:    7.07 cm/s LV PW:         1.00 cm   LV E/e' medial:  17.7 LV IVS:        1.10 cm   LV e' lateral:   13.90 cm/s LVOT diam:     2.00 cm   LV E/e' lateral: 9.0 LV SV:         81 LV SV Index:   47 LVOT Area:     3.14 cm  RIGHT VENTRICLE RV Basal diam:  3.60 cm RV Mid diam:    2.70 cm LEFT ATRIUM             Index        RIGHT ATRIUM           Index LA diam:        4.80 cm 2.77 cm/m   RA Area:     20.20 cm LA Vol (A2C):   66.7 ml 38.53 ml/m  RA Volume:   57.20 ml  33.04 ml/m LA Vol (A4C):   58.8 ml 33.96 ml/m LA Biplane Vol: 66.2 ml 38.24 ml/m  AORTIC VALVE AV Area (Vmax):    2.26 cm AV Area (Vmean):   2.25 cm AV Area (VTI):     2.44 cm AV Vmax:           179.00 cm/s AV Vmean:          124.500 cm/s AV VTI:            0.332 m AV Peak Grad:      12.8 mmHg AV Mean Grad:      7.0 mmHg LVOT Vmax:         129.00 cm/s LVOT Vmean:        89.300 cm/s LVOT VTI:          0.257 m LVOT/AV VTI ratio: 0.78  AORTA Ao Root diam: 3.03 cm MITRAL VALVE                TRICUSPID VALVE MV Area (PHT): 4.24 cm     TR Peak grad:   52.7 mmHg MV Area VTI:   2.99 cm     TR Vmax:        363.00 cm/s MV Peak grad:  7.5 mmHg MV Mean grad:  4.0 mmHg     SHUNTS MV Vmax:       1.37 m/s     Systemic VTI:  0.26 m MV Vmean:      92.6 cm/s    Systemic Diam: 2.00 cm MV Decel Time: 179 msec MV E velocity: 125.00 cm/s MV A velocity: 95.40 cm/s MV E/A ratio:  1.31 Lorine Bears MD Electronically signed by Lorine Bears MD Signature Date/Time: 08/26/2022/11:32:20 AM    Final    DG Chest Portable 1 View  Result  Date: 08/25/2022 CLINICAL DATA:  Shortness of breath EXAM: PORTABLE CHEST 1 VIEW COMPARISON:  X-ray 03/24/2022 FINDINGS: Normal cardiopericardial silhouette with a calcified aorta. Diffuse interstitial changes. No pneumothorax, effusion or consolidation. Overlapping cardiac leads. Fixation hardware along the lower cervical spine. Calcified aorta. Hyperinflation. IMPRESSION: Hyperinflation. Interstitial changes. Increased from previous. Acute process is possible. Recommend follow-up Electronically Signed   By: Karen Kays M.D.   On: 08/25/2022 13:56             LOS: 2 days     Sunnie Nielsen, DO [facility] 08/27/2022, 3:34 PM    Dictation software may have been used to generate the above note. Typos may occur and escape review in typed/dictated notes. Please contact Dr Lyn Hollingshead directly for clarity if needed.  Staff may message me via secure chat in Epic  but this may not receive an immediate response,  please page me for urgent matters!  If 7PM-7AM, please contact night coverage [email or web address]

## 2022-08-27 NOTE — Progress Notes (Signed)
Physical Therapy Treatment Patient Details Name: Beth [name] MRN: [id number] DOB: 1945-09-11 Today's Date: 08/27/2022   History of Present Illness Pt is a 77 y.o. female with medical history significant of dCHF, COPD on 3L of O2 only at night, HTN, HJLD, CAD, stroke, depression with anxiety, CKD stage IIIa, chronic pain and fibromyalgia, IBS, pulmonary hypertension, who presents with shortness of breath and dysuria.  MD assessment includes: acute on chronic diastolic CHF, severe pulmonary arterial systolic hypertension, severe COPD, myocardial injury likely due to demand ischemia, hypokalemia, and UTI.    PT Comments  Pt received in bed on 3L O2, 95% at rest. Pt able to transfer self to EOB with increased effort and rest break needed. Focused session on gait training with Rollator (pt uses one at home) on 3L O2. Pt tolerated 29ft x 3, 40ft x 1 with seated rest breaks x 2 minutes due to DOE and SpO2 dropping to 86%. Pt demonstrated safety concerns with use of Rollator during sit<>stand transfers due to SOB and difficulty remembering to engage/disengage breaks. Will continue to progress per POC. Pt is declining STR.    If plan is discharge home, recommend the following: A little help with walking and/or transfers;A little help with bathing/dressing/bathroom;Assistance with cooking/housework;Assist for transportation   Can travel by private vehicle     Yes  Equipment Recommendations  Other (comment) (TBD at next level of care, pt has a Rollator at home)    Recommendations for Other Services       Precautions / Restrictions Precautions Precautions: Fall Restrictions Weight Bearing Restrictions: No     Mobility  Bed Mobility Overal bed mobility: Modified Independent             General bed mobility comments: Min extra time and effort only    Transfers Overall transfer level: Needs assistance Equipment used: Rollator (4 wheels) Transfers: Sit to/from Stand Sit to Stand: Min  guard           General transfer comment: Min extra effort to come to standing but no physical assist needed    Ambulation/Gait Ambulation/Gait assistance: Min guard Gait Distance (Feet): 25 Feet Assistive device: Rollator (4 wheels) Gait Pattern/deviations: Step-through pattern, Decreased step length - right, Decreased step length - left, Trunk flexed Gait velocity: decreased     General Gait Details: Very slow, effortul cadence with short B step length and min instability that the pt was able to self-correct   Stairs             Wheelchair Mobility     Tilt Bed    Modified Rankin (Stroke Patients Only)       Balance                                            Cognition Arousal/Alertness: Awake/alert Behavior During Therapy: WFL for tasks assessed/performed Overall Cognitive Status: Within Functional Limits for tasks assessed                                          Exercises General Exercises - Lower Extremity Ankle Circles/Pumps: AROM, Both, Seated, 20 reps Long Arc Quad: AROM, Both, 10 reps, Seated Hip Flexion/Marching: AROM, Both, 10 reps, Seated    General Comments General comments (skin integrity, edema, etc.): Pt remained  on 3L O2 throughtout session, dropping to 86% after short distance gait of 33ft with DOE.      Pertinent Vitals/Pain Pain Assessment Pain Assessment: 0-10 Pain Score: 3  Pain Location: Bilateral hips Pain Descriptors / Indicators: Sore Pain Intervention(s): Monitored during session    Home Living                          Prior Function            PT Goals (current goals can now be found in the care plan section) Acute Rehab PT Goals Patient Stated Goal: To get stronger and return home to my dog Progress towards PT goals: Progressing toward goals    Frequency    Min 1X/week      PT Plan      Co-evaluation              AM-PAC PT "6 Clicks" Mobility    Outcome Measure  Help needed turning from your back to your side while in a flat bed without using bedrails?: None Help needed moving from lying on your back to sitting on the side of a flat bed without using bedrails?: None Help needed moving to and from a bed to a chair (including a wheelchair)?: A Little Help needed standing up from a chair using your arms (e.g., wheelchair or bedside chair)?: A Little Help needed to walk in hospital room?: A Little Help needed climbing 3-5 steps with a railing? : A Lot 6 Click Score: 19    End of Session   Activity Tolerance: Patient tolerated treatment well;Patient limited by fatigue Patient left: in chair;with call bell/phone within reach Nurse Communication: Mobility status;Other (comment) PT Visit Diagnosis: Unsteadiness on feet (R26.81);History of falling (Z91.81);Difficulty in walking, not elsewhere classified (R26.2);Muscle weakness (generalized) (M62.81);Pain Pain - part of body: Hip     Time: 1140-1220 PT Time Calculation (min) (ACUTE ONLY): 40 min  Charges:    $Gait Training: 8-22 mins $Therapeutic Exercise: 8-22 mins $Therapeutic Activity: 8-22 mins PT General Charges $$ ACUTE PT VISIT: 1 Visit                    Zadie Cleverly, PTA  Jannet Askew 08/27/2022, 12:58 PM

## 2022-08-27 NOTE — Plan of Care (Signed)
  Problem: Education: Goal: Ability to demonstrate management of disease process will improve Outcome: Progressing   Problem: Activity: Goal: Capacity to carry out activities will improve Outcome: Progressing   Problem: Cardiac: Goal: Ability to achieve and maintain adequate cardiopulmonary perfusion will improve Outcome: Progressing   Problem: Education: Goal: Knowledge of disease or condition will improve Outcome: Progressing   Problem: Activity: Goal: Ability to tolerate increased activity will improve Outcome: Progressing

## 2022-08-28 DIAGNOSIS — F332 Major depressive disorder, recurrent severe without psychotic features: Secondary | ICD-10-CM

## 2022-08-28 DIAGNOSIS — I5032 Chronic diastolic (congestive) heart failure: Secondary | ICD-10-CM

## 2022-08-28 DIAGNOSIS — I5033 Acute on chronic diastolic (congestive) heart failure: Secondary | ICD-10-CM | POA: Diagnosis not present

## 2022-08-28 MED ORDER — FLUOXETINE HCL 20 MG PO CAPS
20.0000 mg | ORAL_CAPSULE | Freq: Every day | ORAL | 1 refills | Status: DC
Start: 2022-08-28 — End: 2023-04-25

## 2022-08-28 MED ORDER — UMECLIDINIUM BROMIDE 62.5 MCG/ACT IN AEPB
1.0000 | INHALATION_SPRAY | Freq: Every day | RESPIRATORY_TRACT | Status: DC
  Administered 2022-08-28: 1 via RESPIRATORY_TRACT
  Filled 2022-08-28: qty 7

## 2022-08-28 MED ORDER — TRAMADOL HCL 50 MG PO TABS: 50.0000 mg | ORAL_TABLET | Freq: Four times a day (QID) | ORAL | 0 refills | Status: AC | PRN

## 2022-08-28 MED ORDER — FLUTICASONE FUROATE-VILANTEROL 200-25 MCG/ACT IN AEPB
1.0000 | INHALATION_SPRAY | Freq: Every day | RESPIRATORY_TRACT | Status: DC
  Administered 2022-08-28: 1 via RESPIRATORY_TRACT
  Filled 2022-08-28: qty 28

## 2022-08-28 NOTE — Care Management Important Message (Signed)
Important Message  Patient Details  Name: Beth [name] MRN: [id number] Date of Birth: October 15, 1945   Medicare Important Message Given:  N/A - LOS <3 / Initial given by admissions     [name] 08/28/2022, 8:51 AM

## 2022-08-28 NOTE — Discharge Summary (Signed)
Physician Discharge Summary   Patient: Beth [name] MRN: [id number] DOB: Oct 14, 1945  Admit date:     08/25/2022  Discharge date: 08/28/22  Discharge Physician: Loyce Dys   PCP: Smitty Cords, DO     Discharge Diagnoses: Principal Problem:   Acute on chronic diastolic CHF (congestive heart failure) (HCC) Active Problems:   Severe pulmonary arterial systolic hypertension (HCC)   COPD, severe (HCC)   Myocardial injury   Stroke (HCC)   HLD (hyperlipidemia)   HTN (hypertension)   Chronic kidney disease, stage 3a (HCC)   Hypokalemia   Prolonged QT interval   UTI (urinary tract infection)   Anxiety and depression   Smoking    Hospital Course: Beth [name] is a 77 y.o. female with medical history significant of dCHF, COPD on 3L of O2 only at night, HTN, HJLD, CAD, stroke, depression with anxiety, CKD stage IIIa, chronic pain and fibromyalgia, IBS, pulmonary hypertension, who presents with shortness of breath and dysuria.  Received IV Lasix with improvement in respiratory function.  Patient requiring 3 L of oxygen and maintaining appropriate saturation.  She has been informed she will need to continue oxygen at home.  She was recommended for skilled nursing facility/rehab however patient's chose to go home with home health.  Consultants: None Procedures performed: None Disposition: Home health Diet recommendation:  Discharge Diet Orders (From admission, onward)     Start     Ordered   08/28/22 0000  Diet - low sodium heart healthy        08/28/22 1332   08/27/22 0000  Diet - low sodium heart healthy        08/27/22 0934           Cardiac diet DISCHARGE MEDICATION: Allergies as of 08/28/2022       Reactions   Bextra  [valdecoxib]    Compazine [prochlorperazine Edisylate]    Stroke-like symptoms   Lithium Carbonate    Leg weakness   Lyrica [pregabalin]         Medication List     STOP taking these medications    ketoconazole 2 %  cream Commonly known as: NIZORAL   tizanidine 2 MG capsule Commonly known as: ZANAFLEX       TAKE these medications    acetaminophen 500 MG tablet Commonly known as: TYLENOL Take 500 mg by mouth 2 (two) times daily as needed.   albuterol 108 (90 Base) MCG/ACT inhaler Commonly known as: VENTOLIN HFA Inhale 1-2 puffs into the lungs every 6 (six) hours as needed for wheezing or shortness of breath.   aspirin 81 MG chewable tablet Chew 81 mg by mouth daily.   atorvastatin 40 MG tablet Commonly known as: LIPITOR TAKE 1 TABLET BY MOUTH EVERY DAY What changed:  when to take this additional instructions   clobetasol ointment 0.05 % Commonly known as: TEMOVATE Apply 1 Application topically daily.   FLUoxetine 20 MG capsule Commonly known as: PROZAC Take 1 capsule (20 mg total) by mouth daily. What changed: See the new instructions.   furosemide 20 MG tablet Commonly known as: LASIX Take 2 tablets (40 mg total) by mouth daily. If swelling is less, can take only 1 pill.   gabapentin 300 MG capsule Commonly known as: NEURONTIN Take 900 mg by mouth 3 (three) times daily. Take with one 600 mg tablet for total 900 mg three times daily What changed: Another medication with the same name was removed. Continue taking this medication, and follow the  directions you see here.   Melatonin 3 MG Caps Take 10 mg by mouth at bedtime as needed (sleep).   methocarbamol 500 MG tablet Commonly known as: ROBAXIN Take 1 tablet (500 mg total) by mouth every 8 (eight) hours as needed for muscle spasms.   metoprolol succinate 25 MG 24 hr tablet Commonly known as: TOPROL-XL TAKE 1/2 TABLET(12.5 MG) BY MOUTH DAILY What changed: See the new instructions.   montelukast 10 MG tablet Commonly known as: SINGULAIR TAKE 1 TABLET(10 MG) BY MOUTH AT BEDTIME What changed: See the new instructions.   nitrofurantoin (macrocrystal-monohydrate) 100 MG capsule Commonly known as: MACROBID Take 100 mg by  mouth 2 (two) times daily.   ondansetron 4 MG disintegrating tablet Commonly known as: ZOFRAN-ODT Take 1 tablet (4 mg total) by mouth every 8 (eight) hours as needed for nausea or vomiting.   potassium chloride 10 MEQ tablet Commonly known as: KLOR-CON TAKE 1 TABLET(10 MEQ) BY MOUTH DAILY What changed: See the new instructions.   QUEtiapine 50 MG tablet Commonly known as: SEROQUEL Take 1 tablet (50 mg total) by mouth at bedtime. Total of 250 mg at night. Take along with 200 mg tab What changed:  how much to take additional instructions Another medication with the same name was removed. Continue taking this medication, and follow the directions you see here.   traMADol 50 MG tablet Commonly known as: Ultram Take 1 tablet (50 mg total) by mouth every 6 (six) hours as needed for up to 3 days for moderate pain or severe pain.   Trelegy Ellipta 200-62.5-25 MCG/ACT Aepb Generic drug: Fluticasone-Umeclidin-Vilant INHALE 1 PUFF INTO THE LUNGS DAILY               Durable Medical Equipment  (From admission, onward)           Start     Ordered   08/27/22 0935  DME 3-in-1  Once        08/27/22 0934            Discharge Exam: Filed Weights   08/25/22 1227 08/26/22 0348 08/28/22 0343  Weight: 62.6 kg 66.3 kg 62.6 kg   Cardiovascular:     Rate and Rhythm: Normal rate and regular rhythm.     Heart sounds: Murmur heard.  Pulmonary: Clear to auscultation bilaterally Musculoskeletal:     Right lower leg: No edema.     Left lower leg: No edema.  Neurological:     General: No focal deficit present.     Mental Status: She is alert and oriented to person, place, and time.  Psychiatric:        Mood and Affect: Mood normal.     Condition at discharge: good    Discharge time spent:  33 minutes.  Signed: Loyce Dys, MD [facility] 08/28/2022

## 2022-08-28 NOTE — Progress Notes (Signed)
Patient is not able to walk the distance required to go the bathroom, or he/she is unable to safely negotiate stairs required to access the bathroom.  A 3in1 BSC will alleviate this problem  Ashley Holcomb, LCSW, MSW, MHA [phone]  

## 2022-08-28 NOTE — Progress Notes (Signed)
Occupational Therapy Treatment Patient Details Name: Beth [name] MRN: [id number] DOB: 21-Jul-1945 Today's Date: 08/28/2022   History of present illness Pt is a 77 y.o. female with medical history significant of dCHF, COPD on 3L of O2 only at night, HTN, HJLD, CAD, stroke, depression with anxiety, CKD stage IIIa, chronic pain and fibromyalgia, IBS, pulmonary hypertension, who presents with shortness of breath and dysuria.  MD assessment includes: acute on chronic diastolic CHF, severe pulmonary arterial systolic hypertension, severe COPD, myocardial injury likely due to demand ischemia, hypokalemia, and UTI.   OT comments  Upon entering the room, pt supine in bed and agreeable to OT intervention. Pt ambulates to bathroom with RW and supervision. Pt on 4Ls for all mobility tasks. Pt making the statement she can just take oxygen off to go to bathroom. OT educating pt on the need for O2 at all times right now including if pt were to return home and shower. OT discussed what could happen if pt removed O2 for a period of time. Pt verbalized understanding. Pt manages clothing and hygiene with supervision and stands at sink for grooming tasks and then ambulates 25' in room out of bathroom to recliner chair. Call bell and all needed items within reach.    Recommendations for follow up therapy are one component of a multi-disciplinary discharge planning process, led by the attending physician.  Recommendations may be updated based on patient status, additional functional criteria and insurance authorization.    Assistance Recommended at Discharge Intermittent Supervision/Assistance  Patient can return home with the following  A little help with bathing/dressing/bathroom;Help with stairs or ramp for entrance   Equipment Recommendations  BSC/3in1       Precautions / Restrictions Precautions Precautions: Fall       Mobility Bed Mobility Overal bed mobility: Modified Independent                   Transfers Overall transfer level: Needs assistance Equipment used: Rolling walker (2 wheels) Transfers: Sit to/from Stand Sit to Stand: Supervision                 Balance Overall balance assessment: Needs assistance Sitting-balance support: No upper extremity supported, Feet supported Sitting balance-Leahy Scale: Normal     Standing balance support: During functional activity, Reliant on assistive device for balance, Bilateral upper extremity supported Standing balance-Leahy Scale: Fair                             ADL either performed or assessed with clinical judgement   ADL Overall ADL's : Needs assistance/impaired     Grooming: Wash/dry hands;Standing;Supervision/safety                   Toilet Transfer: Supervision/safety;Rolling walker (2 wheels)   Toileting- Clothing Manipulation and Hygiene: Supervision/safety;Sit to/from stand              Extremity/Trunk Assessment Upper Extremity Assessment Upper Extremity Assessment: Defer to OT evaluation   Lower Extremity Assessment Lower Extremity Assessment: Generalized weakness        Vision Patient Visual Report: No change from baseline            Cognition Arousal/Alertness: Awake/alert Behavior During Therapy: WFL for tasks assessed/performed Overall Cognitive Status: Within Functional Limits for tasks assessed  Pertinent Vitals/ Pain       Pain Assessment Pain Assessment: 0-10 Pain Score: 2  Pain Location: Bilateral hips Pain Descriptors / Indicators: Sore Pain Intervention(s): Monitored during session, Repositioned         Frequency  Min 1X/week        Progress Toward Goals  OT Goals(current goals can now be found in the care plan section)  Progress towards OT goals: Progressing toward goals     Plan Discharge plan remains appropriate;Frequency remains appropriate       AM-PAC  OT "6 Clicks" Daily Activity     Outcome Measure   Help from another person eating meals?: None Help from another person taking care of personal grooming?: A Little Help from another person toileting, which includes using toliet, bedpan, or urinal?: A Little Help from another person bathing (including washing, rinsing, drying)?: A Little Help from another person to put on and taking off regular upper body clothing?: None Help from another person to put on and taking off regular lower body clothing?: None 6 Click Score: 21    End of Session Equipment Utilized During Treatment: Rolling walker (2 wheels);Oxygen (4Ls)  OT Visit Diagnosis: Other abnormalities of gait and mobility (R26.89);Muscle weakness (generalized) (M62.81)   Activity Tolerance Patient tolerated treatment well   Patient Left in bed;with bed alarm set;with call bell/phone within reach   Nurse Communication Mobility status        Time: 1037-1100 OT Time Calculation (min): 23 min  Charges: OT General Charges $OT Visit: 1 Visit OT Treatments $Self Care/Home Management : 8-22 mins $Therapeutic Activity: 8-22 mins  Jackquline Denmark, MS, OTR/L , CBIS ascom [phone]  08/28/22, 12:43 PM

## 2022-08-29 ENCOUNTER — Telehealth: Payer: Self-pay | Admitting: Family Medicine

## 2022-08-29 ENCOUNTER — Telehealth: Payer: Self-pay

## 2022-08-29 NOTE — Telephone Encounter (Signed)
Pt's daughter [name], is calling in because pt was in the hospital and [name] wanted to speak with someone regarding putting in an order for an oxygen tank.

## 2022-08-29 NOTE — Transitions of Care (Post Inpatient/ED Visit) (Signed)
   08/29/2022  Name: Beth [name] MRN: [id number] DOB: [birth year]  Today's TOC FU Call Status: Today's TOC FU Call Status:: Unsuccessful Call (1st Attempt) Unsuccessful Call (1st Attempt) Date: 08/29/22  Attempted to reach the patient regarding the most recent Inpatient/ED visit.  Follow Up Plan: Additional outreach attempts will be made to reach the patient to complete the Transitions of Care (Post Inpatient/ED visit) call.   Alto Denver RN, MSN, CCM RN Care Manager  [facility]  Ambulatory Care Management Direct Number: [phone]

## 2022-08-30 ENCOUNTER — Emergency Department
Admission: EM | Admit: 2022-08-30 | Discharge: 2022-08-30 | Disposition: A | Discharge status: Home / Self Care | Payer: 59 | Attending: Emergency Medicine | Admitting: Emergency Medicine

## 2022-08-30 ENCOUNTER — Telehealth: Payer: Self-pay | Admitting: Cardiology

## 2022-08-30 ENCOUNTER — Telehealth: Payer: Self-pay

## 2022-08-30 ENCOUNTER — Other Ambulatory Visit: Payer: Self-pay

## 2022-08-30 DIAGNOSIS — R079 Chest pain, unspecified: Secondary | ICD-10-CM | POA: Diagnosis not present

## 2022-08-30 DIAGNOSIS — I509 Heart failure, unspecified: Secondary | ICD-10-CM | POA: Diagnosis not present

## 2022-08-30 DIAGNOSIS — R0789 Other chest pain: Secondary | ICD-10-CM | POA: Diagnosis present

## 2022-08-30 DIAGNOSIS — I499 Cardiac arrhythmia, unspecified: Secondary | ICD-10-CM | POA: Diagnosis not present

## 2022-08-30 DIAGNOSIS — I4891 Unspecified atrial fibrillation: Secondary | ICD-10-CM | POA: Diagnosis not present

## 2022-08-30 DIAGNOSIS — Z743 Need for continuous supervision: Secondary | ICD-10-CM | POA: Diagnosis not present

## 2022-08-30 DIAGNOSIS — R000 Tachycardia, unspecified: Secondary | ICD-10-CM | POA: Diagnosis not present

## 2022-08-30 DIAGNOSIS — R0902 Hypoxemia: Secondary | ICD-10-CM | POA: Diagnosis not present

## 2022-08-30 LAB — CBC WITH DIFFERENTIAL/PLATELET
Abs Immature Granulocytes: 0.09 10*3/uL — ABNORMAL HIGH (ref 0.00–0.07)
Basophils Absolute: 0.1 10*3/uL (ref 0.0–0.1)
Basophils Relative: 1 %
Eosinophils Absolute: 0.6 10*3/uL — ABNORMAL HIGH (ref 0.0–0.5)
Eosinophils Relative: 6 %
HCT: 36.7 % (ref 36.0–46.0)
Hemoglobin: 12.1 g/dL (ref 12.0–15.0)
Immature Granulocytes: 1 %
Lymphocytes Relative: 29 %
Lymphs Abs: 2.8 10*3/uL (ref 0.7–4.0)
MCH: 30.8 pg (ref 26.0–34.0)
MCHC: 33 g/dL (ref 30.0–36.0)
MCV: 93.4 fL (ref 80.0–100.0)
Monocytes Absolute: 0.8 10*3/uL (ref 0.1–1.0)
Monocytes Relative: 8 %
Neutro Abs: 5.3 10*3/uL (ref 1.7–7.7)
Neutrophils Relative %: 55 %
Platelets: 332 10*3/uL (ref 150–400)
RBC: 3.93 MIL/uL (ref 3.87–5.11)
RDW: 14.6 % (ref 11.5–15.5)
WBC: 9.6 10*3/uL (ref 4.0–10.5)
nRBC: 0 % (ref 0.0–0.2)

## 2022-08-30 LAB — BASIC METABOLIC PANEL
Anion gap: 10 (ref 5–15)
BUN: 25 mg/dL — ABNORMAL HIGH (ref 8–23)
CO2: 27 mmol/L (ref 22–32)
Calcium: 9 mg/dL (ref 8.9–10.3)
Chloride: 101 mmol/L (ref 98–111)
Creatinine, Ser: 0.86 mg/dL (ref 0.44–1.00)
GFR, Estimated: >60 mL/min (ref >60)
Glucose, Bld: 116 mg/dL — ABNORMAL HIGH (ref 70–99)
Potassium: 3.6 mmol/L (ref 3.5–5.1)
Sodium: 138 mmol/L (ref 135–145)

## 2022-08-30 LAB — TROPONIN I (HIGH SENSITIVITY)
Troponin I (High Sensitivity): 10 ng/L (ref <18)
Troponin I (High Sensitivity): 10 ng/L (ref <18)

## 2022-08-30 MED ORDER — DILTIAZEM HCL-DEXTROSE 125-5 MG/125ML-% IV SOLN (PREMIX)
5.0000 mg/h | INTRAVENOUS | Status: DC
  Administered 2022-08-30: 5 mg/h via INTRAVENOUS
  Filled 2022-08-30: qty 125

## 2022-08-30 MED ORDER — APIXABAN 5 MG PO TABS: 5.0000 mg | ORAL_TABLET | Freq: Two times a day (BID) | ORAL | 1 refills | Status: DC

## 2022-08-30 MED ORDER — METOPROLOL SUCCINATE ER 25 MG PO TB24: 25.0000 mg | ORAL_TABLET | Freq: Every day | ORAL | 2 refills | Status: DC

## 2022-08-30 MED ORDER — DILTIAZEM HCL 25 MG/5ML IV SOLN
15.0000 mg | Freq: Once | INTRAVENOUS | Status: AC
  Administered 2022-08-30: 15 mg via INTRAVENOUS
  Filled 2022-08-30: qty 5

## 2022-08-30 NOTE — Transitions of Care (Post Inpatient/ED Visit) (Signed)
08/30/2022  Name: Beth [name] MRN: [id number] DOB: May 30, 1945  Today's TOC FU Call Status: Today's TOC FU Call Status:: Successful TOC FU Call Completed TOC FU Call Complete Date: 08/30/22  Transition Care Management Follow-up Telephone Call Date of Discharge: 08/28/22 Discharge Facility: [facility]) Type of Discharge: Inpatient Admission Primary Inpatient Discharge Diagnosis:: Acute on Chronic Heart Failure How have you been since you were released from the hospital?: Better Any questions or concerns?: Yes Patient Questions/Concerns:: what types of foods best to eat and if Lean Cuisine has lots of sodium in them, education provided Patient Questions/Concerns Addressed: Provided Patient Educational Materials  Items Reviewed: Did you receive and understand the discharge instructions provided?: Yes Medications obtained,verified, and reconciled?: Yes (Medications Reviewed) Any new allergies since your discharge?: No Dietary orders reviewed?: Yes Type of Diet Ordered:: Heart healthy Do you have support at home?: Yes People in Home: child(ren), adult, friend(s) Name of Support/Comfort Primary Source: son [name] and Daughter, and her friends are checking in on her  Medications Reviewed Today: Medications Reviewed Today     Reviewed by Marlowe Sax, RN (Case Manager) on 08/30/22 at 1133  Med List Status: <None>   Medication Order Taking? Sig Documenting Provider Last Dose Status Informant  acetaminophen (TYLENOL) 500 MG tablet [id number] No Take 500 mg by mouth 2 (two) times daily as needed. [provider] prn unknown Active Self  albuterol (VENTOLIN HFA) 108 (90 Base) MCG/ACT inhaler [id number] No Inhale 1-2 puffs into the lungs every 6 (six) hours as needed for wheezing or shortness of breath. Smitty Cords, DO prn unknown Active Self  aspirin 81 MG chewable tablet [id number] No Chew 81 mg by mouth daily. [provider]  08/25/2022 Active Pharmacy Records, Self           Med Note Truman Hayward   Wed Dec 18, 2016  4:14 PM)    atorvastatin (LIPITOR) 40 MG tablet [id number] No TAKE 1 TABLET BY MOUTH EVERY DAY  Patient taking differently: Take 40 mg by mouth at bedtime. TAKE 1 TABLET BY MOUTH EVERY DAY   Smitty Cords, DO 08/24/2022 Active Self           Med Note Janeann Forehand Aug 25, 2022  2:57 PM)    clobetasol ointment (TEMOVATE) 0.05 % [id number] No Apply 1 Application topically daily. [provider] 08/25/2022 Active Self  FLUoxetine (PROZAC) 20 MG capsule [id number]  Take 1 capsule (20 mg total) by mouth daily. Loyce Dys, MD  Active   furosemide (LASIX) 20 MG tablet [id number] No Take 2 tablets (40 mg total) by mouth daily. If swelling is less, can take only 1 pill. Smitty Cords, DO prn unknown Active Self           Med Note Janeann Forehand Aug 25, 2022  2:57 PM)    gabapentin (NEURONTIN) 300 MG capsule [id number] No Take 900 mg by mouth 3 (three) times daily. Take with one 600 mg tablet for total 900 mg three times daily [provider] 08/25/2022 Active Self  Melatonin 3 MG CAPS [id number] No Take 10 mg by mouth at bedtime as needed (sleep).  [provider] prn unknown Active Self  methocarbamol (ROBAXIN) 500 MG tablet [id number] No Take 1 tablet (500 mg total) by mouth every 8 (eight) hours as needed for muscle spasms. Karamalegos, Alexander J, DO prn unknown Active Self  metoprolol succinate (TOPROL-XL) 25 MG 24  hr tablet [id number] No TAKE 1/2 TABLET(12.5 MG) BY MOUTH DAILY  Patient taking differently: Take 12.5 mg by mouth daily. TAKE 1/2 TABLET(12.5 MG) BY MOUTH DAILY   Smitty Cords, DO 08/25/2022 Active Self  montelukast (SINGULAIR) 10 MG tablet [id number] No TAKE 1 TABLET(10 MG) BY MOUTH AT BEDTIME  Patient taking differently: Take 10 mg by mouth at bedtime.   Smitty Cords, DO 08/24/2022 Active Self            Med Note Janeann Forehand Aug 25, 2022  2:57 PM)    nitrofurantoin, macrocrystal-monohydrate, (MACROBID) 100 MG capsule [id number] No Take 100 mg by mouth 2 (two) times daily. [provider] 08/24/2022 Active Self  ondansetron (ZOFRAN-ODT) 4 MG disintegrating tablet [id number] No Take 1 tablet (4 mg total) by mouth every 8 (eight) hours as needed for nausea or vomiting. Smitty Cords, DO prn unknown Active Self  potassium chloride (KLOR-CON) 10 MEQ tablet [id number] No TAKE 1 TABLET(10 MEQ) BY MOUTH DAILY  Patient taking differently: Take 10 mEq by mouth daily as needed.   Smitty Cords, DO prn unknown Active Self  QUEtiapine (SEROQUEL) 50 MG tablet [id number] No Take 1 tablet (50 mg total) by mouth at bedtime. Total of 250 mg at night. Take along with 200 mg tab  Patient taking differently: Take 250 mg by mouth at bedtime. Take along with one 200 mg tablet for total 250 mg at bedtime   Neysa Hotter, MD 08/24/2022 Active Self  traMADol (ULTRAM) 50 MG tablet [id number]  Take 1 tablet (50 mg total) by mouth every 6 (six) hours as needed for up to 3 days for moderate pain or severe pain. Loyce Dys, MD  Active   [facility] ELLIPTA 200-62.5-25 MCG/ACT AEPB [id number] No INHALE 1 PUFF INTO THE LUNGS DAILY Salena Saner, MD 08/25/2022 Active Self  Med List Note Nonah Mattes, RN 02/12/18 1055): Medication agreement signed 02/12/2018 UDS 02/24/17 03/25/17 PA requested for gabapentin sent SB             Home Care and Equipment/Supplies: Were Home Health Services Ordered?: Yes Name of Home Health Agency:: [facility] Has Agency set up a time to come to your home?: Yes First Home Health Visit Date: 09/02/22 Any new equipment or medical supplies ordered?: No (changed her oxygen to use 24/7) Were you able to get the equipment/medical supplies?: Yes Do you have any questions related to the use of the equipment/supplies?: No  Functional  Questionnaire: Do you need assistance with bathing/showering or dressing?: No Do you need assistance with meal preparation?: No Do you need assistance with eating?: No Do you have difficulty maintaining continence: No Do you need assistance with getting out of bed/getting out of a chair/moving?: No Do you have difficulty managing or taking your medications?: No  Follow up appointments reviewed: PCP Follow-up appointment confirmed?: Yes Date of PCP follow-up appointment?: 09/12/22 Follow-up Provider: Dr. Saralyn Pilar Specialist [facility] Follow-up appointment confirmed?: No Reason Specialist Follow-Up Not Confirmed: Patient has Specialist Provider Number and will Call for Appointment Do you need transportation to your follow-up appointment?: No Do you understand care options if your condition(s) worsen?: Yes-patient verbalized understanding    Alto Denver RN, MSN, CCM RN Care Manager  [facility]  Ambulatory Care Management  Direct Number: [phone]

## 2022-08-30 NOTE — ED Triage Notes (Signed)
Patient BIB EMS for chest pain. EMS found the patient to have HR in the 220's. EMS gave 10mg  cardizem IV. Recent admission for the same and DC this past Wednesday.

## 2022-08-30 NOTE — Discharge Instructions (Addendum)
Please seek medical attention for any high fevers, chest pain, shortness of breath, change in behavior, persistent vomiting, bloody stool or any other new or concerning symptoms.  

## 2022-08-30 NOTE — ED Provider Notes (Signed)
[facility] Provider Note    Event Date/Time   First MD Initiated Contact with Patient 08/30/22 1525     (approximate)   History   Chest Pain   HPI  Beth [name] is a 77 y.o. female who presents to the emergency department today because of concerns for chest pressure and fast heart rate.  Patient states she was just sitting in her house not doing anything particularly unusual when she started feeling the symptoms.  She denies any unusual caffeine use today.  Denies any history of A-fib.  When EMS arrived they reported heart rates over 200.  Did give diltiazem on the way.  At the time of my exam the patient stated that her chest pressure was better. Did have recent admission to the hospital for CHF exacerbation.      Physical Exam   Triage Vital Signs: ED Triage Vitals  Encounter Vitals Group     BP 08/30/22 1523 137/78     Systolic BP Percentile --      Diastolic BP Percentile --      Pulse Rate 08/30/22 1523 (!) 162     Resp 08/30/22 1523 20     Temp 08/30/22 1523 98 F (36.7 C)     Temp Source 08/30/22 1523 Oral     SpO2 08/30/22 1521 95 %     Weight 08/30/22 1529 140 lb (63.5 kg)     Height 08/30/22 1529 5\' 6"  (1.676 m)     Head Circumference --      Peak Flow --      Pain Score --      Pain Loc --      Pain Education --      Exclude from Growth Chart --     Most recent vital signs: Vitals:   08/30/22 1521 08/30/22 1523  BP:  137/78  Pulse:  (!) 162  Resp:  20  Temp:  98 F (36.7 C)  SpO2: 95% 95%   General: Awake, alert, oriented. CV:  Good peripheral perfusion. Tachycardia, irregular rhythm. Resp:  Normal effort. Lungs clear. Abd:  No distention.    ED Results / Procedures / Treatments   Labs (all labs ordered are listed, but only abnormal results are displayed) Labs Reviewed  CBC WITH DIFFERENTIAL/PLATELET - Abnormal; Notable for the following components:      Result Value   Eosinophils Absolute 0.6 (*)    Abs  Immature Granulocytes 0.09 (*)    All other components within normal limits  BASIC METABOLIC PANEL - Abnormal; Notable for the following components:   Glucose, Bld 116 (*)    BUN 25 (*)    All other components within normal limits  TROPONIN I (HIGH SENSITIVITY)  TROPONIN I (HIGH SENSITIVITY)     EKG  I, Phineas Semen, attending physician, personally viewed and interpreted this EKG  EKG Time: 1525 Rate: 183 Rhythm: atrial fibrillation with rvr Axis: normal Intervals: qtc 501 QRS: narrow, q waves v1 ST changes: no st elevation Impression: abnormal ekg  I, Phineas Semen, attending physician, personally viewed and interpreted this EKG  EKG Time: 1614 Rate: 80 Rhythm: sinus rhythm Axis: normal Intervals: qtc 445 QRS: narrow, LVH ST changes: no st elevation Impression: abnormal ekg   RADIOLOGY None   PROCEDURES:  Critical Care performed: Yes  CRITICAL CARE Performed by: Phineas Semen   Total critical care time: 30 minutes  Critical care time was exclusive of separately billable procedures and treating other patients.  Critical care was necessary to treat or prevent imminent or life-threatening deterioration.  Critical care was time spent personally by me on the following activities: development of treatment plan with patient and/or surrogate as well as nursing, discussions with consultants, evaluation of patient's response to treatment, examination of patient, obtaining history from patient or surrogate, ordering and performing treatments and interventions, ordering and review of laboratory studies, ordering and review of radiographic studies, pulse oximetry and re-evaluation of patient's condition.   Procedures    MEDICATIONS ORDERED IN ED: Medications  diltiazem (CARDIZEM) injection 15 mg (has no administration in time range)     IMPRESSION / MDM / ASSESSMENT AND PLAN / ED COURSE  I reviewed the triage vital signs and the nursing notes.                               Differential diagnosis includes, but is not limited to, arrhythmia, electrolyte abnormality, heard damage.   Patient's presentation is most consistent with acute presentation with potential threat to life or bodily function.   The patient is on the cardiac monitor to evaluate for evidence of arrhythmia and/or significant heart rate changes.  Patient presented to the emergency department today because of concerns for chest pressure and fast heart rate.  On exam patient is quite tachycardic.  EKG is concerning for A-fib with RVR.  Patient denies history of A-fib.  Patient was given dose of diltiazem by EMS and another bolus shortly upon arrival to the emergency department.  However patient's heart rate continued to be quite elevated.  Will place patient on diltiazem drip while we await blood work.  Patient was placed on diltiazem drip.  Shortly thereafter however she converted back to a normal sinus rhythm.  Blood work without any concerning arrhythmias, troponin negative x 2.  Patient did have recent echocardiogram.  Discussed with Dr. Flora Lipps with cardiology.  At this time will plan on discharge with close cardiology follow-up.  Will start patient on Eliquis and increase metoprolol to 25 mg daily.  Discussed plan with patient.  Discussed strict return precautions.  Discussed blood thinner precautions.      FINAL CLINICAL IMPRESSION(S) / ED DIAGNOSES   Final diagnoses:  Atrial fibrillation, unspecified type [facility])     Note:  This document was prepared using Dragon voice recognition software and may include unintentional dictation errors.    Phineas Semen, MD 08/30/22 [phone]

## 2022-08-30 NOTE — ED Notes (Signed)
HR noted to be 80's and SR. EKG obtained and provided to Derrill Kay, MD. Cardizem infusion stopped.

## 2022-08-30 NOTE — ED Notes (Addendum)
ED Provider at bedside. 

## 2022-08-30 NOTE — Telephone Encounter (Signed)
I spoke with [name] she is going to contact [facility] to get a portable O2 tank.  She reports that [name] is having to go back to the ER today.

## 2022-08-30 NOTE — Patient Instructions (Signed)
Visit Information  Thank you for taking time to visit with me today. Please don't hesitate to contact me if I can be of assistance to you before our next scheduled telephone appointment.  Following are the goals we discussed today:  Today's TOC FU Call Status: Today's TOC FU Call Status:: Successful TOC FU Call Completed TOC FU Call Complete Date: 08/30/22  Transition Care Management Follow-up Telephone Call Date of Discharge: 08/28/22 Discharge Facility: [facility]) Type of Discharge: Inpatient Admission Primary Inpatient Discharge Diagnosis:: Acute on Chronic Heart Failure How have you been since you were released from the hospital?: Better Any questions or concerns?: Yes Patient Questions/Concerns:: what types of foods best to eat and if Lean Cuisine has lots of sodium in them, education provided Patient Questions/Concerns Addressed: Provided Patient Educational Materials  Items Reviewed: Did you receive and understand the discharge instructions provided?: Yes Medications obtained,verified, and reconciled?: Yes (Medications Reviewed) Any new allergies since your discharge?: No Dietary orders reviewed?: Yes Type of Diet Ordered:: Heart healthy Do you have support at home?: Yes People in Home: child(ren), adult, friend(s) Name of Support/Comfort Primary Source: son [name] and Daughter, and her friends are checking in on her  Medications Reviewed Today: Medications Reviewed Today     Reviewed by Marlowe Sax, RN (Case Manager) on 08/30/22 at 1133  Med List Status: <None>   Medication Order Taking? Sig Documenting Provider Last Dose Status Informant  acetaminophen (TYLENOL) 500 MG tablet [id number] No Take 500 mg by mouth 2 (two) times daily as needed. [provider] prn unknown Active Self  albuterol (VENTOLIN HFA) 108 (90 Base) MCG/ACT inhaler [id number] No Inhale 1-2 puffs into the lungs every 6 (six) hours as needed for wheezing or shortness  of breath. Smitty Cords, DO prn unknown Active Self  aspirin 81 MG chewable tablet [id number] No Chew 81 mg by mouth daily. [provider] 08/25/2022 Active Pharmacy Records, Self           Med Note Truman Hayward   Wed Dec 18, 2016  4:14 PM)    atorvastatin (LIPITOR) 40 MG tablet [id number] No TAKE 1 TABLET BY MOUTH EVERY DAY  Patient taking differently: Take 40 mg by mouth at bedtime. TAKE 1 TABLET BY MOUTH EVERY DAY   Smitty Cords, DO 08/24/2022 Active Self           Med Note Janeann Forehand Aug 25, 2022  2:57 PM)    clobetasol ointment (TEMOVATE) 0.05 % [id number] No Apply 1 Application topically daily. [provider] 08/25/2022 Active Self  FLUoxetine (PROZAC) 20 MG capsule [id number]  Take 1 capsule (20 mg total) by mouth daily. Loyce Dys, MD  Active   furosemide (LASIX) 20 MG tablet [id number] No Take 2 tablets (40 mg total) by mouth daily. If swelling is less, can take only 1 pill. Smitty Cords, DO prn unknown Active Self           Med Note Janeann Forehand Aug 25, 2022  2:57 PM)    gabapentin (NEURONTIN) 300 MG capsule [id number] No Take 900 mg by mouth 3 (three) times daily. Take with one 600 mg tablet for total 900 mg three times daily [provider] 08/25/2022 Active Self  Melatonin 3 MG CAPS [id number] No Take 10 mg by mouth at bedtime as needed (sleep).  [provider] prn unknown Active Self  methocarbamol (ROBAXIN) 500 MG tablet [id number] No Take 1  tablet (500 mg total) by mouth every 8 (eight) hours as needed for muscle spasms. Smitty Cords, DO prn unknown Active Self  metoprolol succinate (TOPROL-XL) 25 MG 24 hr tablet [id number] No TAKE 1/2 TABLET(12.5 MG) BY MOUTH DAILY  Patient taking differently: Take 12.5 mg by mouth daily. TAKE 1/2 TABLET(12.5 MG) BY MOUTH DAILY   Smitty Cords, DO 08/25/2022 Active Self  montelukast (SINGULAIR) 10 MG tablet [id number] No TAKE 1  TABLET(10 MG) BY MOUTH AT BEDTIME  Patient taking differently: Take 10 mg by mouth at bedtime.   Smitty Cords, DO 08/24/2022 Active Self           Med Note Janeann Forehand Aug 25, 2022  2:57 PM)    nitrofurantoin, macrocrystal-monohydrate, (MACROBID) 100 MG capsule [id number] No Take 100 mg by mouth 2 (two) times daily. [provider] 08/24/2022 Active Self  ondansetron (ZOFRAN-ODT) 4 MG disintegrating tablet [id number] No Take 1 tablet (4 mg total) by mouth every 8 (eight) hours as needed for nausea or vomiting. Smitty Cords, DO prn unknown Active Self  potassium chloride (KLOR-CON) 10 MEQ tablet [id number] No TAKE 1 TABLET(10 MEQ) BY MOUTH DAILY  Patient taking differently: Take 10 mEq by mouth daily as needed.   Smitty Cords, DO prn unknown Active Self  QUEtiapine (SEROQUEL) 50 MG tablet [id number] No Take 1 tablet (50 mg total) by mouth at bedtime. Total of 250 mg at night. Take along with 200 mg tab  Patient taking differently: Take 250 mg by mouth at bedtime. Take along with one 200 mg tablet for total 250 mg at bedtime   Neysa Hotter, MD 08/24/2022 Active Self  traMADol (ULTRAM) 50 MG tablet [id number]  Take 1 tablet (50 mg total) by mouth every 6 (six) hours as needed for up to 3 days for moderate pain or severe pain. Loyce Dys, MD  Active   [facility] ELLIPTA 200-62.5-25 MCG/ACT AEPB [id number] No INHALE 1 PUFF INTO THE LUNGS DAILY Salena Saner, MD 08/25/2022 Active Self  Med List Note Nonah Mattes, RN 02/12/18 1055): Medication agreement signed 02/12/2018 UDS 02/24/17 03/25/17 PA requested for gabapentin sent SB             Home Care and Equipment/Supplies: Were Home Health Services Ordered?: Yes Name of Home Health Agency:: [facility] Has Agency set up a time to come to your home?: Yes First Home Health Visit Date: 09/02/22 Any new equipment or medical supplies ordered?: No (changed her oxygen to use  24/7) Were you able to get the equipment/medical supplies?: Yes Do you have any questions related to the use of the equipment/supplies?: No  Functional Questionnaire: Do you need assistance with bathing/showering or dressing?: No Do you need assistance with meal preparation?: No Do you need assistance with eating?: No Do you have difficulty maintaining continence: No Do you need assistance with getting out of bed/getting out of a chair/moving?: No Do you have difficulty managing or taking your medications?: No  Follow up appointments reviewed: PCP Follow-up appointment confirmed?: Yes Date of PCP follow-up appointment?: 09/12/22 Follow-up Provider: Dr. Saralyn Pilar Specialist [facility] Follow-up appointment confirmed?: No Reason Specialist Follow-Up Not Confirmed: Patient has Specialist Provider Number and will Call for Appointment Do you need transportation to your follow-up appointment?: No Do you understand care options if your condition(s) worsen?: Yes-patient verbalized understanding    Alto Denver RN, MSN, CCM RN Care Manager  [facility]  Ambulatory Care Management  Direct Number:  [phone]   Our next appointment is by telephone on 09-16-2022 at 230 pm  Please call the care guide team at [phone] if you need to cancel or reschedule your appointment.   If you are experiencing a Mental Health or Behavioral Health Crisis or need someone to talk to, please call the Suicide and Crisis Lifeline: 988 call the [place] National Suicide Prevention Lifeline: 564 354 0914 or TTY: 936-031-6225 TTY 437-149-8108) to talk to a trained counselor call 1-800-273-TALK (toll free, 24 hour hotline)   Patient verbalizes understanding of instructions and care plan provided today and agrees to view in MyChart. Active MyChart status and patient understanding of how to access instructions and care plan via MyChart confirmed with patient.

## 2022-08-30 NOTE — Telephone Encounter (Signed)
Copied from CRM [id number]. Topic: General - Other >> Aug 29, 2022  4:37 PM Macon Large wrote: Reason for CRM: [facility] reports that they will be out for skilled nursing and physical therapy services on Monday 09/02/22. Cb# [phone] Ext. [phone]

## 2022-08-30 NOTE — Telephone Encounter (Signed)
STAT if HR is under 50 or over 120 (normal HR is 60-100 beats per minute)  What is your heart rate?  HR fluctuating as high as 190  Do you have a log of your heart rate readings (document readings)?  No  Do you have any other symptoms? Disoriented  Son stated patient's HR has been fluctuating and wants call back on next steps.

## 2022-08-30 NOTE — Telephone Encounter (Signed)
Spoke with patients son and he advised that his mothers HR has been fluctuating in the 190s and wanted to know if they should be seen in ED. Advised patients son that they need to go to the ED now or call 911 as she is feeling disoriented. Patient sons verbalized understanding and states they will go to the ED.

## 2022-09-02 ENCOUNTER — Ambulatory Visit: Payer: Self-pay | Admitting: *Deleted

## 2022-09-02 DIAGNOSIS — Z602 Problems related to living alone: Secondary | ICD-10-CM | POA: Diagnosis not present

## 2022-09-02 DIAGNOSIS — N1831 Chronic kidney disease, stage 3a: Secondary | ICD-10-CM | POA: Diagnosis not present

## 2022-09-02 DIAGNOSIS — Z7901 Long term (current) use of anticoagulants: Secondary | ICD-10-CM | POA: Diagnosis not present

## 2022-09-02 DIAGNOSIS — I5033 Acute on chronic diastolic (congestive) heart failure: Secondary | ICD-10-CM | POA: Diagnosis not present

## 2022-09-02 DIAGNOSIS — E876 Hypokalemia: Secondary | ICD-10-CM | POA: Diagnosis not present

## 2022-09-02 DIAGNOSIS — K589 Irritable bowel syndrome without diarrhea: Secondary | ICD-10-CM | POA: Diagnosis not present

## 2022-09-02 DIAGNOSIS — G8929 Other chronic pain: Secondary | ICD-10-CM | POA: Diagnosis not present

## 2022-09-02 DIAGNOSIS — M797 Fibromyalgia: Secondary | ICD-10-CM | POA: Diagnosis not present

## 2022-09-02 DIAGNOSIS — Z7951 Long term (current) use of inhaled steroids: Secondary | ICD-10-CM | POA: Diagnosis not present

## 2022-09-02 DIAGNOSIS — N390 Urinary tract infection, site not specified: Secondary | ICD-10-CM | POA: Diagnosis not present

## 2022-09-02 DIAGNOSIS — I2510 Atherosclerotic heart disease of native coronary artery without angina pectoris: Secondary | ICD-10-CM | POA: Diagnosis not present

## 2022-09-02 DIAGNOSIS — Z556 Problems related to health literacy: Secondary | ICD-10-CM | POA: Diagnosis not present

## 2022-09-02 DIAGNOSIS — J449 Chronic obstructive pulmonary disease, unspecified: Secondary | ICD-10-CM | POA: Diagnosis not present

## 2022-09-02 DIAGNOSIS — Z9981 Dependence on supplemental oxygen: Secondary | ICD-10-CM | POA: Diagnosis not present

## 2022-09-02 DIAGNOSIS — I130 Hypertensive heart and chronic kidney disease with heart failure and stage 1 through stage 4 chronic kidney disease, or unspecified chronic kidney disease: Secondary | ICD-10-CM | POA: Diagnosis not present

## 2022-09-02 DIAGNOSIS — I509 Heart failure, unspecified: Secondary | ICD-10-CM | POA: Diagnosis not present

## 2022-09-02 DIAGNOSIS — Z79899 Other long term (current) drug therapy: Secondary | ICD-10-CM | POA: Diagnosis not present

## 2022-09-02 DIAGNOSIS — Z9181 History of falling: Secondary | ICD-10-CM | POA: Diagnosis not present

## 2022-09-02 DIAGNOSIS — I272 Pulmonary hypertension, unspecified: Secondary | ICD-10-CM | POA: Diagnosis not present

## 2022-09-02 DIAGNOSIS — I4891 Unspecified atrial fibrillation: Secondary | ICD-10-CM | POA: Diagnosis not present

## 2022-09-02 DIAGNOSIS — Z8673 Personal history of transient ischemic attack (TIA), and cerebral infarction without residual deficits: Secondary | ICD-10-CM | POA: Diagnosis not present

## 2022-09-02 DIAGNOSIS — I252 Old myocardial infarction: Secondary | ICD-10-CM | POA: Diagnosis not present

## 2022-09-02 NOTE — Telephone Encounter (Signed)
Noted  

## 2022-09-02 NOTE — Telephone Encounter (Signed)
Reason for Disposition  [1] Caller has URGENT medicine question about med that PCP or specialist prescribed AND [2] triager unable to answer question  Answer Assessment - Initial Assessment Questions 1. NAME of MEDICINE: "What medicine(s) are you calling about?"     Pt and daughter [name] calling in. Tramadol 2. QUESTION: "What is your question?" (e.g., double dose of medicine, side effect)     My doctor sent it in and it wasn't signed.   She was in the hospital and they sent it to the [facility].  It wasn't signed.   The ED doctor did not sign it.      Appt is for 09/12/2022. I went to the ED for pain in hips and back.   The pain started 6 weeks ago.   A home health nurse is coming out today.   Went to ED because oxygen was low.   She was admitted due to low oxygen.    She went into A. Fib. So had to go back.    3. PRESCRIBER: "Who prescribed the medicine?" Reason: if prescribed by specialist, call should be referred to that group.     ED doctor.    4. SYMPTOMS: "Do you have any symptoms?" If Yes, ask: "What symptoms are you having?"  "How bad are the symptoms (e.g., mild, moderate, severe)     Hip and back pain.    5. PREGNANCY:  "Is there any chance that you are pregnant?" "When was your last menstrual period?"     N/A due to age  Protocols used: Medication Question Call-A-AH

## 2022-09-02 NOTE — Telephone Encounter (Signed)
  Chief Complaint: Pt was admitted to the hospital on 08/25/2022 because her oxygen level was low.   She was readmitted on 08/30/2022 for A. Fib.    When she was discharged the admitting dr. Did not sign the order for Tramadol.   The [facility] has been trying to contact the dr but have not been able to get a hold of him.   Symptoms: Pt is having hip and back pain that started 6 weeks ago.   She is wanting to know if Dr. Althea Charon can prescribe the pain medication.   She has an appt for 09/12/2022 but she is in pain now and requesting pain medication before her appt. Frequency: Daily having hip and back pain Pertinent Negatives: Patient denies recent injuries.   She fell a "while back" and had x rays done and nothing was broken.  She has tried ibuprofen and heat without relief.   Disposition: [] ED /[] Urgent Care (no appt availability in office) / [] Appointment(In office/virtual)/ []  [facility]/ [] Home Care/ [] Refused Recommended Disposition /[] [place] [facility]/ [x]  Follow-up with PCP Additional Notes: Her daughter Jarome Lamas (on [place]) was on the phone with pt.   Please call [name] at [phone] if pt can be worked in sooner to see Dr. Althea Charon since her mother is in a lot of pain in her hips and back.  She currently has an appt with Dr. Althea Charon for 09/12/2022.

## 2022-09-03 ENCOUNTER — Telehealth: Payer: Self-pay | Admitting: Family Medicine

## 2022-09-03 ENCOUNTER — Telehealth: Payer: Self-pay

## 2022-09-03 ENCOUNTER — Other Ambulatory Visit: Payer: Self-pay

## 2022-09-03 DIAGNOSIS — G8929 Other chronic pain: Secondary | ICD-10-CM | POA: Diagnosis not present

## 2022-09-03 DIAGNOSIS — Z7901 Long term (current) use of anticoagulants: Secondary | ICD-10-CM | POA: Diagnosis not present

## 2022-09-03 DIAGNOSIS — I5033 Acute on chronic diastolic (congestive) heart failure: Secondary | ICD-10-CM | POA: Diagnosis not present

## 2022-09-03 DIAGNOSIS — Z7951 Long term (current) use of inhaled steroids: Secondary | ICD-10-CM | POA: Diagnosis not present

## 2022-09-03 DIAGNOSIS — I272 Pulmonary hypertension, unspecified: Secondary | ICD-10-CM | POA: Diagnosis not present

## 2022-09-03 DIAGNOSIS — Z9181 History of falling: Secondary | ICD-10-CM | POA: Diagnosis not present

## 2022-09-03 DIAGNOSIS — Z602 Problems related to living alone: Secondary | ICD-10-CM | POA: Diagnosis not present

## 2022-09-03 DIAGNOSIS — I2510 Atherosclerotic heart disease of native coronary artery without angina pectoris: Secondary | ICD-10-CM | POA: Diagnosis not present

## 2022-09-03 DIAGNOSIS — I4891 Unspecified atrial fibrillation: Secondary | ICD-10-CM | POA: Diagnosis not present

## 2022-09-03 DIAGNOSIS — Z8673 Personal history of transient ischemic attack (TIA), and cerebral infarction without residual deficits: Secondary | ICD-10-CM | POA: Diagnosis not present

## 2022-09-03 DIAGNOSIS — Z556 Problems related to health literacy: Secondary | ICD-10-CM | POA: Diagnosis not present

## 2022-09-03 DIAGNOSIS — N390 Urinary tract infection, site not specified: Secondary | ICD-10-CM | POA: Diagnosis not present

## 2022-09-03 DIAGNOSIS — E876 Hypokalemia: Secondary | ICD-10-CM | POA: Diagnosis not present

## 2022-09-03 DIAGNOSIS — N1831 Chronic kidney disease, stage 3a: Secondary | ICD-10-CM | POA: Diagnosis not present

## 2022-09-03 DIAGNOSIS — J449 Chronic obstructive pulmonary disease, unspecified: Secondary | ICD-10-CM | POA: Diagnosis not present

## 2022-09-03 DIAGNOSIS — I130 Hypertensive heart and chronic kidney disease with heart failure and stage 1 through stage 4 chronic kidney disease, or unspecified chronic kidney disease: Secondary | ICD-10-CM | POA: Diagnosis not present

## 2022-09-03 DIAGNOSIS — I509 Heart failure, unspecified: Secondary | ICD-10-CM | POA: Diagnosis not present

## 2022-09-03 DIAGNOSIS — M797 Fibromyalgia: Secondary | ICD-10-CM | POA: Diagnosis not present

## 2022-09-03 DIAGNOSIS — K589 Irritable bowel syndrome without diarrhea: Secondary | ICD-10-CM | POA: Diagnosis not present

## 2022-09-03 DIAGNOSIS — Z79899 Other long term (current) drug therapy: Secondary | ICD-10-CM | POA: Diagnosis not present

## 2022-09-03 DIAGNOSIS — Z9981 Dependence on supplemental oxygen: Secondary | ICD-10-CM | POA: Diagnosis not present

## 2022-09-03 DIAGNOSIS — I252 Old myocardial infarction: Secondary | ICD-10-CM | POA: Diagnosis not present

## 2022-09-03 NOTE — Telephone Encounter (Signed)
Pt has appt 8/15.   Pt called w/ [employer] on the line.  They want to make sure at pt's appt the need for an aid is addressed. Pt states she needs an aid asap

## 2022-09-03 NOTE — Telephone Encounter (Signed)
Stephanie advised.   Thanks,   -  

## 2022-09-03 NOTE — Telephone Encounter (Signed)
Copied from CRM [id number]. Topic: Quick Communication - Home Health Verbal Orders >> Sep 03, 2022 10:07 AM Clide Dales wrote: Caller/Agency: Stephanie/[facility] Callback Number: [phone] Requesting OT/PT/Skilled Nursing/Social Work/Speech Therapy: OT Frequency: 1w4, 1 time a week every other week for 2 visits.

## 2022-09-03 NOTE — Transitions of Care (Post Inpatient/ED Visit) (Signed)
   09/03/2022  Name: Beth [name] MRN: [id number] DOB: 1945-07-05  Today's TOC FU Call Status: Today's TOC FU Call Status:: Successful TOC FU Call Completed TOC FU Call Complete Date: 09/03/22  Transition Care Management Follow-up Telephone Call Date of Discharge: 08/30/22 Discharge Facility: [facility]) Type of Discharge: Emergency Department Primary Inpatient Discharge Diagnosis:: AFIB Reason for ED Visit: Cardiac Conditions Cardiac Conditions Diagnosis: Atrial Fibrillation How have you been since you were released from the hospital?: Better Any questions or concerns?: No  Items Reviewed: Did you receive and understand the discharge instructions provided?: Yes Medications obtained,verified, and reconciled?: Yes (Medications Reviewed) Any new allergies since your discharge?: No Dietary orders reviewed?: No Do you have support at home?: Yes People in Home: child(ren), adult, friend(s) Name of Support/Comfort Primary Source: son [name] and daughter, and her friends  Medications Reviewed Today: Medications Reviewed Today   Medications were not reviewed in this encounter     Home Care and Equipment/Supplies: Were Home Health Services Ordered?: No Name of Home Health Agency:: [facility] Has Agency set up a time to come to your home?: Yes First Home Health Visit Date: 09/02/22 Any new equipment or medical supplies ordered?: No  Functional Questionnaire: Do you need assistance with bathing/showering or dressing?: No Do you need assistance with meal preparation?: No Do you need assistance with eating?: No Do you have difficulty maintaining continence: No Do you need assistance with getting out of bed/getting out of a chair/moving?: No Do you have difficulty managing or taking your medications?: No  Follow up appointments reviewed: PCP Follow-up appointment confirmed?: Yes Date of PCP follow-up appointment?: 09/12/22 Follow-up Provider:  Dr. Saralyn Pilar Specialist [facility] Follow-up appointment confirmed?: No Reason Specialist Follow-Up Not Confirmed: Patient has Specialist Provider Number and will Call for Appointment Do you need transportation to your follow-up appointment?: No Do you understand care options if your condition(s) worsen?: Yes-patient verbalized understanding    Alto Denver RN, MSN, CCM RN Care Manager  [facility]  Ambulatory Care Management  Direct Number: [phone]

## 2022-09-03 NOTE — Telephone Encounter (Signed)
I don't see it on her list and don't see that she has ever been prescribed this. I guess she would need a virtual visit.

## 2022-09-03 NOTE — Telephone Encounter (Signed)
Ok for verbal orders as requested 

## 2022-09-03 NOTE — Telephone Encounter (Signed)
Would you be able to help with this at her virtual visit tomorrow?   Thanks,   -Vernona Rieger

## 2022-09-03 NOTE — Telephone Encounter (Signed)
VV schedule for 08/07 at 8:40.    Thanks,   -Vernona Rieger

## 2022-09-03 NOTE — Telephone Encounter (Signed)
I should be if they are referring to a home health aide.

## 2022-09-04 ENCOUNTER — Telehealth (INDEPENDENT_AMBULATORY_CARE_PROVIDER_SITE_OTHER): Payer: 59 | Admitting: Internal Medicine

## 2022-09-04 ENCOUNTER — Encounter: Payer: Self-pay | Admitting: Internal Medicine

## 2022-09-04 DIAGNOSIS — I5033 Acute on chronic diastolic (congestive) heart failure: Secondary | ICD-10-CM

## 2022-09-04 DIAGNOSIS — G894 Chronic pain syndrome: Secondary | ICD-10-CM | POA: Diagnosis not present

## 2022-09-04 DIAGNOSIS — I4891 Unspecified atrial fibrillation: Secondary | ICD-10-CM | POA: Diagnosis not present

## 2022-09-04 DIAGNOSIS — D509 Iron deficiency anemia, unspecified: Secondary | ICD-10-CM

## 2022-09-04 DIAGNOSIS — I2489 Other forms of acute ischemic heart disease: Secondary | ICD-10-CM | POA: Diagnosis not present

## 2022-09-04 DIAGNOSIS — N1832 Chronic kidney disease, stage 3b: Secondary | ICD-10-CM

## 2022-09-04 DIAGNOSIS — M797 Fibromyalgia: Secondary | ICD-10-CM

## 2022-09-04 MED ORDER — TRAMADOL HCL 50 MG PO TABS: 50.0000 mg | ORAL_TABLET | Freq: Every day | ORAL | 0 refills | Status: DC | PRN

## 2022-09-04 NOTE — Progress Notes (Signed)
Virtual Visit via Video Note  I connected with Beth [name] on 09/04/22 at  8:40 AM EDT by a video enabled telemedicine application and verified that I am speaking with the correct person using two identifiers.  Location: Patient: Home Provider: Home  Persons participating in this video call: Ovidio Hanger, NP and Javier Glazier   I discussed the limitations of evaluation and management by telemedicine and the availability of in person appointments. The patient expressed understanding and agreed to proceed.  History of Present Illness:  Patient due for TCM hospital follow-up.  She presented today ER 7/28 with complaint of shortness of breath and dysuria.  Her labs revealed a chronically elevated white count, persistent anemia and chronic kidney disease that have not worsened.  Her potassium was slightly low.  Her BNP was elevated.  She did have a slight bump in her troponins which peaked at 87.  Her chest x-ray showed hyperinflation but no evidence of pneumonia or effusion.  Urinalysis was normal.  She was treated with IV Lasix and maintained on her home oxygen of 3 L.  She was discharged on 7/31 with recommendation to go to a SNF but she insisted on going home instead.  She presented back to the ER 8/2 with complaint of chest pressure and fast heart rate.  She was given a dose of diltiazem by EMS.  ECG showed A-fib with RVR.  She was started on a diltiazem drip in the ER which converted her back to NSR.Marland Kitchen  Cardiology was consulted.  She was started on Eliquis and her metoprolol was increased.  She was discharged the same day and advised to follow-up with cardiology.  Since that time, she denies having any rapid heart rate or chest pressure.  She has chronic shortness of breath which has not been worse since her recent admission.  Her biggest complaint at this time is fatigue, chronic muscle and joint pain.  She has a history of fibromyalgia and chronic joint pain.  She denies recent falls.    X-ray lumbar spine from 02/2022 showed:  IMPRESSION: 1. No acute fracture or subluxation. 2. Stable T12 compression deformity. 3. Diffuse advanced degenerative disc disease.  She is asking that I fill her tramadol but is not currently on her medication list and I see no evidence that she has had this prescribed in epic before.   Past Medical History:  Diagnosis Date   Allergy    Anxiety    Asthma    Chronic pain syndrome    discharged from pain clinic, hx of narcotics seeking behavior   COPD (chronic obstructive pulmonary disease) (HCC)    CVA (cerebral infarction)    Depression    Fibromyalgia    Headache    Hyperlipidemia    Hypertension    IBS (irritable bowel syndrome)    Stroke (HCC)    Vitamin D deficiency     Current Outpatient Medications  Medication Sig Dispense Refill   acetaminophen (TYLENOL) 500 MG tablet Take 500 mg by mouth 2 (two) times daily as needed.     albuterol (VENTOLIN HFA) 108 (90 Base) MCG/ACT inhaler Inhale 1-2 puffs into the lungs every 6 (six) hours as needed for wheezing or shortness of breath. 18 g 2   apixaban (ELIQUIS) 5 MG TABS tablet Take 1 tablet (5 mg total) by mouth 2 (two) times daily. 60 tablet 1   aspirin 81 MG chewable tablet Chew 81 mg by mouth daily.     atorvastatin (LIPITOR) 40 MG  tablet TAKE 1 TABLET BY MOUTH EVERY DAY (Patient taking differently: Take 40 mg by mouth at bedtime. TAKE 1 TABLET BY MOUTH EVERY DAY) 90 tablet 3   clobetasol ointment (TEMOVATE) 0.05 % Apply 1 Application topically daily.     FLUoxetine (PROZAC) 20 MG capsule Take 1 capsule (20 mg total) by mouth daily. 30 capsule 1   furosemide (LASIX) 20 MG tablet Take 2 tablets (40 mg total) by mouth daily. If swelling is less, can take only 1 pill. 180 tablet 3   gabapentin (NEURONTIN) 300 MG capsule Take 900 mg by mouth 3 (three) times daily. Take with one 600 mg tablet for total 900 mg three times daily     Melatonin 3 MG CAPS Take 10 mg by mouth at bedtime as  needed (sleep).      methocarbamol (ROBAXIN) 500 MG tablet Take 1 tablet (500 mg total) by mouth every 8 (eight) hours as needed for muscle spasms. 90 tablet 1   metoprolol succinate (TOPROL XL) 25 MG 24 hr tablet Take 1 tablet (25 mg total) by mouth daily. 30 tablet 2   montelukast (SINGULAIR) 10 MG tablet TAKE 1 TABLET(10 MG) BY MOUTH AT BEDTIME (Patient taking differently: Take 10 mg by mouth at bedtime.) 90 tablet 1   nitrofurantoin, macrocrystal-monohydrate, (MACROBID) 100 MG capsule Take 100 mg by mouth 2 (two) times daily.     ondansetron (ZOFRAN-ODT) 4 MG disintegrating tablet Take 1 tablet (4 mg total) by mouth every 8 (eight) hours as needed for nausea or vomiting. 30 tablet 0   potassium chloride (KLOR-CON) 10 MEQ tablet TAKE 1 TABLET(10 MEQ) BY MOUTH DAILY (Patient taking differently: Take 10 mEq by mouth daily as needed.) 90 tablet 2   QUEtiapine (SEROQUEL) 50 MG tablet Take 1 tablet (50 mg total) by mouth at bedtime. Total of 250 mg at night. Take along with 200 mg tab (Patient taking differently: Take 250 mg by mouth at bedtime. Take along with one 200 mg tablet for total 250 mg at bedtime) 90 tablet 0   TRELEGY ELLIPTA 200-62.5-25 MCG/ACT AEPB INHALE 1 PUFF INTO THE LUNGS DAILY 60 each 2   No current facility-administered medications for this visit.    Allergies  Allergen Reactions   Bextra  [Valdecoxib]    Compazine [Prochlorperazine Edisylate]     Stroke-like symptoms   Lithium Carbonate     Leg weakness   Lyrica [Pregabalin]     Family History  Problem Relation Age of Onset   Anxiety disorder Mother    Depression Mother    Breast cancer Mother 45   Cancer Father    Gallbladder disease Father    Alcohol abuse Father    Depression Father    Bipolar disorder Son     Social History   Socioeconomic History   Marital status: Legally Separated    Spouse name: Not on file   Number of children: 2   Years of education: Not on file   Highest education level: Not on  file  Occupational History   Occupation: retired  Tobacco Use   Smoking status: Some Days    Current packs/day: 0.00    Average packs/day: 0.3 packs/day for 57.0 years (14.3 ttl pk-yrs)    Types: Cigarettes    Start date: 10/25/1963    Last attempt to quit: 10/24/2020    Years since quitting: 1.8   Smokeless tobacco: Never   Tobacco comments:    16 cigarettes a week. Khj 07/26/2022  Vaping Use  Vaping status: Never Used  Substance and Sexual Activity   Alcohol use: No    Alcohol/week: 0.0 standard drinks of alcohol   Drug use: No   Sexual activity: Not Currently  Other Topics Concern   Not on file  Social History Narrative   Not on file   Social Determinants of Health   Financial Resource Strain: Low Risk  (02/01/2022)   Overall Financial Resource Strain (CARDIA)    Difficulty of Paying Living Expenses: Not hard at all  Food Insecurity: No Food Insecurity (08/25/2022)   Hunger Vital Sign    Worried About Running Out of Food in the Last Year: Never true    Ran Out of Food in the Last Year: Never true  Transportation Needs: No Transportation Needs (08/25/2022)   PRAPARE - Administrator, Civil Service (Medical): No    Lack of Transportation (Non-Medical): No  Physical Activity: Insufficiently Active (02/01/2022)   Exercise Vital Sign    Days of Exercise per Week: 7 days    Minutes of Exercise per Session: 20 min  Stress: No Stress Concern Present (02/01/2022)   [employer] of Occupational Health - Occupational Stress Questionnaire    Feeling of Stress : Only a little  Social Connections: Socially Isolated (02/01/2022)   Social Connection and Isolation Panel [NHANES]    Frequency of Communication with Friends and Family: Twice a week    Frequency of Social Gatherings with Friends and Family: Once a week    Attends Religious Services: Never    Database administrator or Organizations: No    Attends Banker Meetings: Never    Marital Status: Separated   Intimate Partner Violence: Not At Risk (08/25/2022)   Humiliation, Afraid, Rape, and Kick questionnaire    Fear of Current or Ex-Partner: No    Emotionally Abused: No    Physically Abused: No    Sexually Abused: No     Constitutional: Patient reports intermittent headaches.  Denies fever, malaise, fatigue, or abrupt weight changes.  HEENT: Denies eye pain, eye redness, ear pain, ringing in the ears, wax buildup, runny nose, nasal congestion, bloody nose, or sore throat. Respiratory: Patient reports chronic shortness of breath.  Denies difficulty breathing, cough or sputum production.   Cardiovascular: Denies chest pain, chest tightness, palpitations or swelling in the hands or feet.  Gastrointestinal: Denies abdominal pain, bloating, constipation, diarrhea or blood in the stool.  GU: Denies urgency, frequency, pain with urination, burning sensation, blood in urine, odor or discharge. Musculoskeletal: Patient reports chronic back pain, muscle pain.  Denies decrease in range of motion, difficulty with gait, or joint swelling.  Skin: Denies redness, rashes, lesions or ulcercations.  Neurological: Denies dizziness, difficulty with memory, difficulty with speech or problems with balance and coordination.  Psych: Patient has a history of anxiety and depression.  Denies SI/HI.  No other specific complaints in a complete review of systems (except as listed in HPI above).  Observations/Objective:   Wt Readings from Last 3 Encounters:  08/30/22 140 lb (63.5 kg)  08/28/22 138 lb 0.1 oz (62.6 kg)  08/12/22 142 lb (64.4 kg)    General: Appears her stated age, chronically ill-appearing. Pulmonary/Chest: Increased effort..  No audible wheezing or respiratory distress noted. Musculoskeletal: She points to her low back and hips as the site of most of her pain. Neurological: Alert and oriented.  Psychiatric: Mildly anxious appearing.    BMET    Component Value Date/Time   NA 138 08/30/2022  1530   NA 143 01/01/2021 1526   NA 144 12/26/2012 0520   K 3.6 08/30/2022 1530   K 3.3 (L) 12/26/2012 0520   CL 101 08/30/2022 1530   CL 114 (H) 12/26/2012 0520   CO2 27 08/30/2022 1530   CO2 24 12/26/2012 0520   GLUCOSE 116 (H) 08/30/2022 1530   GLUCOSE 85 12/26/2012 0520   BUN 25 (H) 08/30/2022 1530   BUN 19 01/01/2021 1526   BUN 13 12/26/2012 0520   CREATININE 0.86 08/30/2022 1530   CREATININE 0.99 09/21/2021 1413   CALCIUM 9.0 08/30/2022 1530   CALCIUM 9.1 12/26/2012 0520   GFRNONAA >60 08/30/2022 1530   GFRNONAA 55 (L) 01/14/2020 1144   GFRAA 63 01/14/2020 1144    Lipid Panel     Component Value Date/Time   CHOL 100 08/26/2022 0445   CHOL 118 03/23/2015 1536   CHOL 185 12/25/2012 1449   TRIG 88 08/26/2022 0445   TRIG 93 12/25/2012 1449   HDL 25 (L) 08/26/2022 0445   HDL 44 03/23/2015 1536   HDL 39 (L) 12/25/2012 1449   CHOLHDL 4.0 08/26/2022 0445   VLDL 18 08/26/2022 0445   VLDL 19 12/25/2012 1449   LDLCALC 57 08/26/2022 0445   LDLCALC 64 04/10/2018 1115   LDLCALC 127 (H) 12/25/2012 1449    CBC    Component Value Date/Time   WBC 9.6 08/30/2022 1530   RBC 3.93 08/30/2022 1530   HGB 12.1 08/30/2022 1530   HGB 12.2 12/25/2012 1449   HCT 36.7 08/30/2022 1530   HCT 36.1 12/25/2012 1449   PLT 332 08/30/2022 1530   PLT 166 12/29/2012 0553   MCV 93.4 08/30/2022 1530   MCV 88 12/25/2012 1449   MCH 30.8 08/30/2022 1530   MCHC 33.0 08/30/2022 1530   RDW 14.6 08/30/2022 1530   RDW 15.6 (H) 12/25/2012 1449   LYMPHSABS 2.8 08/30/2022 1530   LYMPHSABS 2.6 12/25/2012 1449   MONOABS 0.8 08/30/2022 1530   MONOABS 0.4 12/25/2012 1449   EOSABS 0.6 (H) 08/30/2022 1530   EOSABS 0.1 12/25/2012 1449   BASOSABS 0.1 08/30/2022 1530   BASOSABS 0.1 12/25/2012 1449    Hgb A1C Lab Results  Component Value Date   HGBA1C 5.6 08/25/2022        Assessment and Plan:  TCM hospital follow-up for acute on chronic CHF, demand ischemia, iron deficiency anemia, CKD, A-fib  with RVR, fibromyalgia and chronic joint pain:  Hospital notes, labs and imaging reviewed Reviewed current medications with patient She will continue oxygen as previously prescribed She plans to follow-up with cardiology and PCP Rx for tramadol 50 mg daily prn for severe pain-no history of seizures noted  Advised her to follow-up with her PCP as previously scheduled  Follow Up Instructions:    I discussed the assessment and treatment plan with the patient. The patient was provided an opportunity to ask questions and all were answered. The patient agreed with the plan and demonstrated an understanding of the instructions.   The patient was advised to call back or seek an in-person evaluation if the symptoms worsen or if the condition fails to improve as anticipated.   Nicki Reaper, NP

## 2022-09-04 NOTE — Patient Instructions (Signed)
Chronic Pain, Adult Chronic pain is a type of pain that lasts or keeps coming back for at least 3-6 months. You may have headaches, pain in the abdomen, or pain in other areas of the body. Chronic pain may be related to an illness, injury, or a health condition. Sometimes, the cause of chronic pain is not known. Chronic pain can make it hard for you to do daily activities. If it is not treated, chronic pain can lead to anxiety and depression. Treatment depends on the cause of your pain and how severe it is. You may need to work with a pain specialist to come up with a treatment plan. Many people benefit from two or more types of treatment to control their pain. Follow these instructions at home: Treatment plan Follow your treatment plan as told by your health care provider. This may include: Gentle, regular exercise. Eating a healthy diet that includes foods such as vegetables, fruits, fish, and lean meats. Mental health therapy (cognitive or behavioral therapy) that changes the way you think or act in response to the pain. This may help improve how you feel. Doing physical therapy exercises to improve movement and strength. Meditation, yoga, acupuncture, or massage therapy. Using the oils from plants in your environment or on your skin (aromatherapy). Other treatments may include: Over-the-counter or prescription medicines. Color, light, or sound therapy. Local electrical stimulation. The electrical pulses help to relieve pain by temporarily stopping the nerve impulses that cause you to feel pain. Injections. These deliver numbing or pain-relieving medicines into the spine or the area of pain.  Medicines Take over-the-counter and prescription medicines only as told by your health care provider. Ask your health care provider if the medicine prescribed to you: Requires you to avoid driving or using machinery. Can cause constipation. You may need to take these actions to prevent or treat  constipation: Drink enough fluid to keep your urine pale yellow. Take over-the-counter or prescription medicines. Eat foods that are high in fiber, such as beans, whole grains, and fresh fruits and vegetables. Limit foods that are high in fat and processed sugars, such as fried or sweet foods. Lifestyle  Ask your health care provider whether you should keep a pain diary. Your health care provider will tell you what information to write in the diary. This may include: When you have pain. What the pain feels like. How medicines and other behaviors or treatments help to reduce the pain. Consider talking with a mental health care provider about how to help manage chronic pain. Consider joining a chronic pain support group. Try to control or lower your stress levels. Talk with your health care provider about ways to do this. General instructions Learn as much as you can about how to manage your chronic pain. Ask your health care provider if an intensive pain rehabilitation program or a chronic pain specialist would be helpful. Check your pain level as told by your health care provider. Ask your health care provider if you should use a pain scale. Contact a health care provider if: Your pain is not controlled with treatment. You have new pain. You have side effects from pain medicine. You feel weak or you have trouble doing your normal activities. You have trouble sleeping or you develop confusion. You lose feeling or have numbness in your body. You lose control of your bowels or bladder. Get help right away if: Your pain suddenly gets much worse. You develop chest pain. You have trouble breathing or shortness of  breath. You faint, or another person sees you faint. These symptoms may be an emergency. Get help right away. Call 911. Do not wait to see if the symptoms will go away. Do not drive yourself to the hospital. Also, get help right away if: You have thoughts about hurting yourself  or others. Take one of these steps if you feel like you may hurt yourself or others, or have thoughts about taking your own life: Go to your nearest emergency room. Call 911. Call the National Suicide Prevention Lifeline at 613-055-8912 or 988. This is open 24 hours a day. Text the Crisis Text Line at 323-193-1954. This information is not intended to replace advice given to you by your health care provider. Make sure you discuss any questions you have with your health care provider. Document Revised: 09/05/2021 Document Reviewed: 08/08/2021 Elsevier Patient Education  2024 [employer].

## 2022-09-05 ENCOUNTER — Emergency Department: Payer: 59

## 2022-09-05 ENCOUNTER — Other Ambulatory Visit: Payer: Self-pay

## 2022-09-05 ENCOUNTER — Ambulatory Visit: Payer: Self-pay

## 2022-09-05 ENCOUNTER — Inpatient Hospital Stay
Admission: EM | Admit: 2022-09-05 | Discharge: 2022-09-13 | DRG: 948 | Disposition: A | Discharge status: Skilled Nursing Facility | Payer: 59 | Attending: Student in an Organized Health Care Education/Training Program | Admitting: Student in an Organized Health Care Education/Training Program

## 2022-09-05 DIAGNOSIS — S8262XA Displaced fracture of lateral malleolus of left fibula, initial encounter for closed fracture: Secondary | ICD-10-CM | POA: Insufficient documentation

## 2022-09-05 DIAGNOSIS — I110 Hypertensive heart disease with heart failure: Secondary | ICD-10-CM | POA: Diagnosis not present

## 2022-09-05 DIAGNOSIS — R6889 Other general symptoms and signs: Secondary | ICD-10-CM | POA: Diagnosis not present

## 2022-09-05 DIAGNOSIS — S79912A Unspecified injury of left hip, initial encounter: Secondary | ICD-10-CM | POA: Diagnosis not present

## 2022-09-05 DIAGNOSIS — N179 Acute kidney failure, unspecified: Secondary | ICD-10-CM | POA: Diagnosis not present

## 2022-09-05 DIAGNOSIS — I499 Cardiac arrhythmia, unspecified: Secondary | ICD-10-CM | POA: Diagnosis not present

## 2022-09-05 DIAGNOSIS — R509 Fever, unspecified: Secondary | ICD-10-CM | POA: Diagnosis not present

## 2022-09-05 DIAGNOSIS — S82892A Other fracture of left lower leg, initial encounter for closed fracture: Principal | ICD-10-CM

## 2022-09-05 DIAGNOSIS — R0902 Hypoxemia: Secondary | ICD-10-CM | POA: Diagnosis not present

## 2022-09-05 DIAGNOSIS — R45851 Suicidal ideations: Secondary | ICD-10-CM | POA: Diagnosis present

## 2022-09-05 DIAGNOSIS — Z888 Allergy status to other drugs, medicaments and biological substances status: Secondary | ICD-10-CM

## 2022-09-05 DIAGNOSIS — F419 Anxiety disorder, unspecified: Secondary | ICD-10-CM | POA: Diagnosis present

## 2022-09-05 DIAGNOSIS — J9611 Chronic respiratory failure with hypoxia: Secondary | ICD-10-CM

## 2022-09-05 DIAGNOSIS — I480 Paroxysmal atrial fibrillation: Secondary | ICD-10-CM | POA: Diagnosis present

## 2022-09-05 DIAGNOSIS — W010XXA Fall on same level from slipping, tripping and stumbling without subsequent striking against object, initial encounter: Secondary | ICD-10-CM | POA: Diagnosis present

## 2022-09-05 DIAGNOSIS — Z809 Family history of malignant neoplasm, unspecified: Secondary | ICD-10-CM

## 2022-09-05 DIAGNOSIS — N390 Urinary tract infection, site not specified: Secondary | ICD-10-CM | POA: Diagnosis not present

## 2022-09-05 DIAGNOSIS — S8252XA Displaced fracture of medial malleolus of left tibia, initial encounter for closed fracture: Secondary | ICD-10-CM | POA: Diagnosis not present

## 2022-09-05 DIAGNOSIS — R7989 Other specified abnormal findings of blood chemistry: Secondary | ICD-10-CM | POA: Diagnosis not present

## 2022-09-05 DIAGNOSIS — I959 Hypotension, unspecified: Secondary | ICD-10-CM | POA: Diagnosis not present

## 2022-09-05 DIAGNOSIS — J4489 Other specified chronic obstructive pulmonary disease: Secondary | ICD-10-CM | POA: Diagnosis present

## 2022-09-05 DIAGNOSIS — R279 Unspecified lack of coordination: Secondary | ICD-10-CM | POA: Diagnosis not present

## 2022-09-05 DIAGNOSIS — R29890 Loss of height: Secondary | ICD-10-CM | POA: Diagnosis not present

## 2022-09-05 DIAGNOSIS — S0990XA Unspecified injury of head, initial encounter: Secondary | ICD-10-CM | POA: Diagnosis not present

## 2022-09-05 DIAGNOSIS — R748 Abnormal levels of other serum enzymes: Secondary | ICD-10-CM | POA: Diagnosis present

## 2022-09-05 DIAGNOSIS — G894 Chronic pain syndrome: Secondary | ICD-10-CM | POA: Diagnosis not present

## 2022-09-05 DIAGNOSIS — J439 Emphysema, unspecified: Secondary | ICD-10-CM | POA: Diagnosis present

## 2022-09-05 DIAGNOSIS — M79662 Pain in left lower leg: Secondary | ICD-10-CM | POA: Diagnosis not present

## 2022-09-05 DIAGNOSIS — J90 Pleural effusion, not elsewhere classified: Secondary | ICD-10-CM | POA: Diagnosis not present

## 2022-09-05 DIAGNOSIS — I272 Pulmonary hypertension, unspecified: Secondary | ICD-10-CM | POA: Diagnosis present

## 2022-09-05 DIAGNOSIS — I700 Atherosclerosis of aorta: Secondary | ICD-10-CM | POA: Diagnosis present

## 2022-09-05 DIAGNOSIS — F17210 Nicotine dependence, cigarettes, uncomplicated: Secondary | ICD-10-CM | POA: Diagnosis not present

## 2022-09-05 DIAGNOSIS — I482 Chronic atrial fibrillation, unspecified: Secondary | ICD-10-CM | POA: Diagnosis present

## 2022-09-05 DIAGNOSIS — J984 Other disorders of lung: Secondary | ICD-10-CM | POA: Diagnosis not present

## 2022-09-05 DIAGNOSIS — R7401 Elevation of levels of liver transaminase levels: Secondary | ICD-10-CM | POA: Diagnosis not present

## 2022-09-05 DIAGNOSIS — L8990 Pressure ulcer of unspecified site, unspecified stage: Secondary | ICD-10-CM | POA: Insufficient documentation

## 2022-09-05 DIAGNOSIS — S8253XA Displaced fracture of medial malleolus of unspecified tibia, initial encounter for closed fracture: Secondary | ICD-10-CM

## 2022-09-05 DIAGNOSIS — J438 Other emphysema: Secondary | ICD-10-CM

## 2022-09-05 DIAGNOSIS — R1311 Dysphagia, oral phase: Secondary | ICD-10-CM | POA: Diagnosis not present

## 2022-09-05 DIAGNOSIS — I5033 Acute on chronic diastolic (congestive) heart failure: Secondary | ICD-10-CM | POA: Diagnosis present

## 2022-09-05 DIAGNOSIS — I5032 Chronic diastolic (congestive) heart failure: Secondary | ICD-10-CM

## 2022-09-05 DIAGNOSIS — Z7951 Long term (current) use of inhaled steroids: Secondary | ICD-10-CM

## 2022-09-05 DIAGNOSIS — M797 Fibromyalgia: Secondary | ICD-10-CM | POA: Diagnosis present

## 2022-09-05 DIAGNOSIS — G8911 Acute pain due to trauma: Principal | ICD-10-CM | POA: Diagnosis present

## 2022-09-05 DIAGNOSIS — I69354 Hemiplegia and hemiparesis following cerebral infarction affecting left non-dominant side: Secondary | ICD-10-CM | POA: Diagnosis not present

## 2022-09-05 DIAGNOSIS — I4891 Unspecified atrial fibrillation: Secondary | ICD-10-CM

## 2022-09-05 DIAGNOSIS — Z7901 Long term (current) use of anticoagulants: Secondary | ICD-10-CM

## 2022-09-05 DIAGNOSIS — D649 Anemia, unspecified: Secondary | ICD-10-CM | POA: Diagnosis present

## 2022-09-05 DIAGNOSIS — E038 Other specified hypothyroidism: Secondary | ICD-10-CM

## 2022-09-05 DIAGNOSIS — S3992XA Unspecified injury of lower back, initial encounter: Secondary | ICD-10-CM | POA: Diagnosis not present

## 2022-09-05 DIAGNOSIS — R0602 Shortness of breath: Secondary | ICD-10-CM | POA: Diagnosis present

## 2022-09-05 DIAGNOSIS — E785 Hyperlipidemia, unspecified: Secondary | ICD-10-CM | POA: Diagnosis present

## 2022-09-05 DIAGNOSIS — D62 Acute posthemorrhagic anemia: Secondary | ICD-10-CM | POA: Diagnosis not present

## 2022-09-05 DIAGNOSIS — Z9181 History of falling: Secondary | ICD-10-CM

## 2022-09-05 DIAGNOSIS — M6281 Muscle weakness (generalized): Secondary | ICD-10-CM | POA: Diagnosis not present

## 2022-09-05 DIAGNOSIS — Z803 Family history of malignant neoplasm of breast: Secondary | ICD-10-CM

## 2022-09-05 DIAGNOSIS — F32A Depression, unspecified: Secondary | ICD-10-CM | POA: Diagnosis present

## 2022-09-05 DIAGNOSIS — M47816 Spondylosis without myelopathy or radiculopathy, lumbar region: Secondary | ICD-10-CM | POA: Diagnosis not present

## 2022-09-05 DIAGNOSIS — I10 Essential (primary) hypertension: Secondary | ICD-10-CM | POA: Diagnosis present

## 2022-09-05 DIAGNOSIS — S199XXA Unspecified injury of neck, initial encounter: Secondary | ICD-10-CM | POA: Diagnosis not present

## 2022-09-05 DIAGNOSIS — Z9981 Dependence on supplemental oxygen: Secondary | ICD-10-CM | POA: Diagnosis not present

## 2022-09-05 DIAGNOSIS — Z7982 Long term (current) use of aspirin: Secondary | ICD-10-CM

## 2022-09-05 DIAGNOSIS — M160 Bilateral primary osteoarthritis of hip: Secondary | ICD-10-CM | POA: Diagnosis not present

## 2022-09-05 DIAGNOSIS — Z811 Family history of alcohol abuse and dependence: Secondary | ICD-10-CM

## 2022-09-05 DIAGNOSIS — R54 Age-related physical debility: Secondary | ICD-10-CM | POA: Diagnosis present

## 2022-09-05 DIAGNOSIS — Z79899 Other long term (current) drug therapy: Secondary | ICD-10-CM

## 2022-09-05 DIAGNOSIS — R531 Weakness: Secondary | ICD-10-CM | POA: Diagnosis not present

## 2022-09-05 DIAGNOSIS — M80072A Age-related osteoporosis with current pathological fracture, left ankle and foot, initial encounter for fracture: Secondary | ICD-10-CM | POA: Diagnosis present

## 2022-09-05 DIAGNOSIS — F332 Major depressive disorder, recurrent severe without psychotic features: Secondary | ICD-10-CM | POA: Diagnosis present

## 2022-09-05 DIAGNOSIS — R2689 Other abnormalities of gait and mobility: Secondary | ICD-10-CM | POA: Diagnosis not present

## 2022-09-05 DIAGNOSIS — R296 Repeated falls: Secondary | ICD-10-CM | POA: Diagnosis present

## 2022-09-05 DIAGNOSIS — W19XXXA Unspecified fall, initial encounter: Secondary | ICD-10-CM | POA: Diagnosis not present

## 2022-09-05 DIAGNOSIS — S82832A Other fracture of upper and lower end of left fibula, initial encounter for closed fracture: Secondary | ICD-10-CM | POA: Diagnosis not present

## 2022-09-05 DIAGNOSIS — E876 Hypokalemia: Secondary | ICD-10-CM | POA: Diagnosis not present

## 2022-09-05 DIAGNOSIS — Z90710 Acquired absence of both cervix and uterus: Secondary | ICD-10-CM

## 2022-09-05 DIAGNOSIS — F339 Major depressive disorder, recurrent, unspecified: Secondary | ICD-10-CM | POA: Diagnosis present

## 2022-09-05 DIAGNOSIS — K589 Irritable bowel syndrome without diarrhea: Secondary | ICD-10-CM | POA: Diagnosis present

## 2022-09-05 DIAGNOSIS — S8262XD Displaced fracture of lateral malleolus of left fibula, subsequent encounter for closed fracture with routine healing: Secondary | ICD-10-CM | POA: Diagnosis not present

## 2022-09-05 DIAGNOSIS — Z743 Need for continuous supervision: Secondary | ICD-10-CM | POA: Diagnosis not present

## 2022-09-05 DIAGNOSIS — Z818 Family history of other mental and behavioral disorders: Secondary | ICD-10-CM

## 2022-09-05 DIAGNOSIS — Z87440 Personal history of urinary (tract) infections: Secondary | ICD-10-CM

## 2022-09-05 DIAGNOSIS — I69359 Hemiplegia and hemiparesis following cerebral infarction affecting unspecified side: Secondary | ICD-10-CM

## 2022-09-05 DIAGNOSIS — Z741 Need for assistance with personal care: Secondary | ICD-10-CM | POA: Diagnosis not present

## 2022-09-05 DIAGNOSIS — R52 Pain, unspecified: Secondary | ICD-10-CM

## 2022-09-05 DIAGNOSIS — E039 Hypothyroidism, unspecified: Secondary | ICD-10-CM | POA: Diagnosis not present

## 2022-09-05 DIAGNOSIS — R262 Difficulty in walking, not elsewhere classified: Secondary | ICD-10-CM | POA: Diagnosis not present

## 2022-09-05 LAB — CBC WITH DIFFERENTIAL/PLATELET
Abs Immature Granulocytes: 0.07 10*3/uL (ref 0.00–0.07)
Basophils Absolute: 0 10*3/uL (ref 0.0–0.1)
Basophils Relative: 0 %
Eosinophils Absolute: 0.3 10*3/uL (ref 0.0–0.5)
Eosinophils Relative: 2 %
HCT: 33.9 % — ABNORMAL LOW (ref 36.0–46.0)
Hemoglobin: 10.8 g/dL — ABNORMAL LOW (ref 12.0–15.0)
Immature Granulocytes: 1 %
Lymphocytes Relative: 9 %
Lymphs Abs: 1.1 10*3/uL (ref 0.7–4.0)
MCH: 30.9 pg (ref 26.0–34.0)
MCHC: 31.9 g/dL (ref 30.0–36.0)
MCV: 96.9 fL (ref 80.0–100.0)
Monocytes Absolute: 0.7 10*3/uL (ref 0.1–1.0)
Monocytes Relative: 5 %
Neutro Abs: 9.8 10*3/uL — ABNORMAL HIGH (ref 1.7–7.7)
Neutrophils Relative %: 83 %
Platelets: 325 10*3/uL (ref 150–400)
RBC: 3.5 MIL/uL — ABNORMAL LOW (ref 3.87–5.11)
RDW: 13.9 % (ref 11.5–15.5)
WBC: 12 10*3/uL — ABNORMAL HIGH (ref 4.0–10.5)
nRBC: 0 % (ref 0.0–0.2)

## 2022-09-05 LAB — COMPREHENSIVE METABOLIC PANEL
ALT: 241 U/L — ABNORMAL HIGH (ref 0–44)
AST: 360 U/L — ABNORMAL HIGH (ref 15–41)
Albumin: 3.1 g/dL — ABNORMAL LOW (ref 3.5–5.0)
Alkaline Phosphatase: 297 U/L — ABNORMAL HIGH (ref 38–126)
Anion gap: 11 (ref 5–15)
BUN: 26 mg/dL — ABNORMAL HIGH (ref 8–23)
CO2: 26 mmol/L (ref 22–32)
Calcium: 9 mg/dL (ref 8.9–10.3)
Chloride: 102 mmol/L (ref 98–111)
Creatinine, Ser: 0.97 mg/dL (ref 0.44–1.00)
GFR, Estimated: 60 mL/min — ABNORMAL LOW (ref >60)
Glucose, Bld: 109 mg/dL — ABNORMAL HIGH (ref 70–99)
Potassium: 4.6 mmol/L (ref 3.5–5.1)
Sodium: 139 mmol/L (ref 135–145)
Total Bilirubin: 1.6 mg/dL — ABNORMAL HIGH (ref 0.3–1.2)
Total Protein: 6.6 g/dL (ref 6.5–8.1)

## 2022-09-05 LAB — TROPONIN I (HIGH SENSITIVITY)
Troponin I (High Sensitivity): 14 ng/L (ref <18)
Troponin I (High Sensitivity): 16 ng/L (ref <18)

## 2022-09-05 MED ORDER — MONTELUKAST SODIUM 10 MG PO TABS
10.0000 mg | ORAL_TABLET | Freq: Every day | ORAL | Status: DC
  Administered 2022-09-05 – 2022-09-12 (×8): 10 mg via ORAL
  Filled 2022-09-05 (×8): qty 1

## 2022-09-05 MED ORDER — FUROSEMIDE 40 MG PO TABS
40.0000 mg | ORAL_TABLET | Freq: Every day | ORAL | Status: DC
  Administered 2022-09-06: 40 mg via ORAL
  Filled 2022-09-05: qty 1

## 2022-09-05 MED ORDER — HYDRALAZINE HCL 20 MG/ML IJ SOLN: 5.0000 mg | Freq: Three times a day (TID) | INTRAMUSCULAR | Status: DC | PRN

## 2022-09-05 MED ORDER — ENOXAPARIN SODIUM 40 MG/0.4ML IJ SOSY: 40.0000 mg | PREFILLED_SYRINGE | INTRAMUSCULAR | Status: DC

## 2022-09-05 MED ORDER — MORPHINE SULFATE (PF) 4 MG/ML IV SOLN
4.0000 mg | INTRAVENOUS | Status: DC | PRN
  Administered 2022-09-05: 4 mg via INTRAVENOUS
  Filled 2022-09-05: qty 1

## 2022-09-05 MED ORDER — MELATONIN 5 MG PO TABS: 10.0000 mg | ORAL_TABLET | Freq: Every evening | ORAL | Status: DC | PRN

## 2022-09-05 MED ORDER — FLUOXETINE HCL 20 MG PO CAPS
20.0000 mg | ORAL_CAPSULE | Freq: Every day | ORAL | Status: DC
  Administered 2022-09-05 – 2022-09-13 (×9): 20 mg via ORAL
  Filled 2022-09-05 (×9): qty 1

## 2022-09-05 MED ORDER — MORPHINE SULFATE (PF) 4 MG/ML IV SOLN
4.0000 mg | Freq: Once | INTRAVENOUS | Status: AC
  Administered 2022-09-05: 4 mg via INTRAVENOUS
  Filled 2022-09-05: qty 1

## 2022-09-05 MED ORDER — ACETAMINOPHEN 325 MG PO TABS
650.0000 mg | ORAL_TABLET | Freq: Four times a day (QID) | ORAL | Status: DC | PRN
  Administered 2022-09-06 (×2): 650 mg via ORAL
  Filled 2022-09-05 (×2): qty 2

## 2022-09-05 MED ORDER — SODIUM CHLORIDE 0.9 % IV BOLUS: 1000.0000 mL | Freq: Once | INTRAVENOUS | Status: DC

## 2022-09-05 MED ORDER — ATORVASTATIN CALCIUM 20 MG PO TABS
40.0000 mg | ORAL_TABLET | Freq: Every day | ORAL | Status: DC
  Administered 2022-09-05 – 2022-09-06 (×2): 40 mg via ORAL
  Filled 2022-09-05 (×2): qty 2

## 2022-09-05 MED ORDER — ALBUTEROL SULFATE HFA 108 (90 BASE) MCG/ACT IN AERS: 1.0000 | INHALATION_SPRAY | Freq: Four times a day (QID) | RESPIRATORY_TRACT | Status: DC | PRN

## 2022-09-05 MED ORDER — GABAPENTIN 300 MG PO CAPS
900.0000 mg | ORAL_CAPSULE | Freq: Three times a day (TID) | ORAL | Status: DC
  Administered 2022-09-05 – 2022-09-06 (×2): 900 mg via ORAL
  Filled 2022-09-05 (×3): qty 3

## 2022-09-05 MED ORDER — UMECLIDINIUM BROMIDE 62.5 MCG/ACT IN AEPB
1.0000 | INHALATION_SPRAY | Freq: Every day | RESPIRATORY_TRACT | Status: DC
  Administered 2022-09-06 – 2022-09-13 (×8): 1 via RESPIRATORY_TRACT
  Filled 2022-09-05 (×2): qty 7

## 2022-09-05 MED ORDER — ACETAMINOPHEN 650 MG RE SUPP: 650.0000 mg | Freq: Four times a day (QID) | RECTAL | Status: DC | PRN

## 2022-09-05 MED ORDER — OXYCODONE HCL 5 MG PO TABS
5.0000 mg | ORAL_TABLET | Freq: Four times a day (QID) | ORAL | Status: DC | PRN
  Administered 2022-09-06: 5 mg via ORAL
  Filled 2022-09-05: qty 1

## 2022-09-05 MED ORDER — FLUTICASONE FUROATE-VILANTEROL 200-25 MCG/ACT IN AEPB
1.0000 | INHALATION_SPRAY | Freq: Every day | RESPIRATORY_TRACT | Status: DC
  Administered 2022-09-06 – 2022-09-13 (×8): 1 via RESPIRATORY_TRACT
  Filled 2022-09-05: qty 28

## 2022-09-05 MED ORDER — ONDANSETRON HCL 4 MG/2ML IJ SOLN
4.0000 mg | Freq: Four times a day (QID) | INTRAMUSCULAR | Status: AC | PRN
  Administered 2022-09-08 – 2022-09-10 (×4): 4 mg via INTRAVENOUS
  Filled 2022-09-05 (×4): qty 2

## 2022-09-05 MED ORDER — ONDANSETRON HCL 4 MG PO TABS: 4.0000 mg | ORAL_TABLET | Freq: Four times a day (QID) | ORAL | Status: AC | PRN

## 2022-09-05 MED ORDER — METOPROLOL SUCCINATE ER 25 MG PO TB24
25.0000 mg | ORAL_TABLET | Freq: Every day | ORAL | Status: DC
  Administered 2022-09-05 – 2022-09-06 (×2): 25 mg via ORAL
  Filled 2022-09-05 (×2): qty 1

## 2022-09-05 MED ORDER — QUETIAPINE FUMARATE 100 MG PO TABS
250.0000 mg | ORAL_TABLET | Freq: Every day | ORAL | Status: DC
  Administered 2022-09-05 – 2022-09-06 (×2): 250 mg via ORAL
  Filled 2022-09-05 (×2): qty 10
  Filled 2022-09-05 (×3): qty 2.5

## 2022-09-05 MED ORDER — APIXABAN 5 MG PO TABS
5.0000 mg | ORAL_TABLET | Freq: Two times a day (BID) | ORAL | Status: DC
  Administered 2022-09-05 – 2022-09-06 (×3): 5 mg via ORAL
  Filled 2022-09-05 (×3): qty 1

## 2022-09-05 MED ORDER — LORAZEPAM 0.5 MG PO TABS: 0.5000 mg | ORAL_TABLET | Freq: Four times a day (QID) | ORAL | Status: AC | PRN

## 2022-09-05 NOTE — Telephone Encounter (Signed)
  Chief Complaint: Medication question//Fall Symptoms: Twisted ankle in a fall this morning Frequency: this morning Pertinent Negatives: Patient denies  Disposition: [x] ED /[x] Urgent Care (no appt availability in office) / [] Appointment(In office/virtual)/ []  [facility]/ [] Home Care/ [] Refused Recommended Disposition /[] [facility]/ []  Follow-up with PCP Additional Notes: Spoke with pt's daughter, [name]. [name] had a question about pt's meds. RN able to answer question. [name] stated that pt fell this morning and hurt her ankle. Pt is unable to bear weight on ankle. Daughter will take pt to UC. Daughter may need to call EMS, if she cannot get pt up.     Summary: question regarding Medication   Please call daughter regarding her mother's medication, Gabapentin and Tramadol, she would like some clarification       Reason for Disposition  Injury (or injuries) that need emergency care  Answer Assessment - Initial Assessment Questions 1. MECHANISM: "How did the fall happen?"     Trip on O2 cord 2. DOMESTIC VIOLENCE AND ELDER ABUSE SCREENING: "Did you fall because someone pushed you or tried to hurt you?" If Yes, ask: "Are you safe now?"     no 3. ONSET: "When did the fall happen?" (e.g., minutes, hours, or days ago)     This morning 4. LOCATION: "What part of the body hit the ground?" (e.g., back, buttocks, head, hips, knees, hands, head, stomach)     Buttocks 5. INJURY: "Did you hurt (injure) yourself when you fell?" If Yes, ask: "What did you injure? Tell me more about this?" (e.g., body area; type of injury; pain severity)"     Ankle 6. PAIN: "Is there any pain?" If Yes, ask: "How bad is the pain?" (e.g., Scale 1-10; or mild,  moderate, severe)   - NONE (0): No pain   - MILD (1-3): Doesn't interfere with normal activities    - MODERATE (4-7): Interferes with normal activities or awakens from sleep    - SEVERE (8-10): Excruciating pain, unable to do any normal  activities      moderate 7. SIZE: For cuts, bruises, or swelling, ask: "How large is it?" (e.g., inches or centimeters)      Swollen  9. OTHER SYMPTOMS: "Do you have any other symptoms?" (e.g., dizziness, fever, weakness; new onset or worsening).      tripped 10. CAUSE: "What do you think caused the fall (or falling)?" (e.g., tripped, dizzy spell)       trip  Protocols used: Falls and [facility]

## 2022-09-05 NOTE — H&P (Addendum)
History and Physical   MALAYSIAH HUGER ZOX:[id number] DOB: 15-May-1945 DOA: 09/05/2022  PCP: Smitty Cords, DO  Patient coming from: home via EMS  I have personally briefly reviewed patient's old medical records in [facility] EMR.  Chief Concern: fall, left ankle pain  HPI: [name] is a 77 year old female with history of depression, anxiety, neuropathy, insomnia, atrial fibrillation, on Eliquis, who presents to the emergency department for chief concerns of a fall in the morning.  Vitals in the ED showed temperature of 98.4, respiration rate of 18, heart rate 74, blood pressure 133/57, SpO2 99% on room air.  Serum sodium is 139, potassium 4.6, chloride 102, bicarb 26, BUN 26, serum creatinine 0.97, EGFR 60, nonfasting blood glucose 109, WBC of 12, hemoglobin of 10.8, platelets of 324.  High sensitive troponin was 14.  ED treatment: Morphine 4 mg IV one-time dose, sodium chloride 1 L bolus. ------------------------------ At bedside, patient was awake, alert x 3.  She appears to be in mild to moderate pain.  Reports that she tripped over her oxygen cord and fell.  She denies loss of consciousness.  She denies head trauma, chest pain, shortness of breath, dysuria, hematuria, nausea, vomiting, abdominal pain.  She reports her left lower extremity hurts a lot when she does not move it.  Social history: She lives at home.  She denies tobacco, EtOH, recreational drug use.  ROS: Constitutional: no weight change, no fever ENT/Mouth: no sore throat, no rhinorrhea Eyes: no eye pain, no vision changes Cardiovascular: no chest pain, no dyspnea,  no edema, no palpitations Respiratory: no cough, no sputum, no wheezing Gastrointestinal: no nausea, no vomiting, no diarrhea, no constipation Genitourinary: no urinary incontinence, no dysuria, no hematuria Musculoskeletal: no arthralgias, no myalgias, left ankle and lower extremity leg pain Skin: no skin lesions, no  pruritus, Neuro: + weakness, no loss of consciousness, no syncope Psych: no anxiety, no depression, + decrease appetite Heme/Lymph: no bruising, no bleeding  ED Course: Discussed with emergency medicine provider, patient requiring hospitalization for chief concerns of pain control.  Assessment/Plan  Principal Problem:   Inadequate pain control Active Problems:   HLD (hyperlipidemia)   HTN (hypertension)   Anxiety and depression   CVA, old, hemiparesis (HCC)   Fibromyalgia syndrome   Benign hypertension   Chronic recurrent major depressive disorder (HCC)   Major depressive disorder, recurrent, severe w/o psychotic behavior (HCC)   Chronic pain syndrome   Atherosclerosis of aorta (HCC)   Pulmonary hypertension (HCC)   Asthma with COPD   Closed fracture of left lateral malleolus   Atrial fibrillation, chronic (HCC)   Assessment and Plan:  * Inadequate pain control Presumed secondary to left lateral malleolus acute fracture that is minimally displaced EDP discussed case with orthopedic provider on-call Dr. Joice Lofts who will evaluate the patient to determine if this is a surgical case Pain control: Oxycodone 5 mg p.o. every 6 hours as needed for moderate pain, 2 days ordered; morphine 4 mg IV every 4 hours as needed for severe pain, 20 hours of coverage ordered  HLD (hyperlipidemia) Home atorvastatin 40 mg nightly resumed  HTN (hypertension) Home furosemide 40 mg daily resumed for 8/9, metoprolol succinate 25 mg daily resumed for 8/8 Hydralazine 5 mg q8h prn for sbp > 170 mmHg, 4 days  Anxiety and depression Ativan 0.5 mg p.o. every 6 hours as needed for anxiety, 1 day ordered  Atrial fibrillation, chronic (HCC) Patient's last dose of Eliquis was evening of 8/7 Eliquis 5 mg  BID resumed Metoprolol succinate 25 mg daily resumed  Closed fracture of left lateral malleolus Acute, minimally displaced Oxycodone 5 mg p.o. every 6 hours as needed for moderate pain, 2 days ordered;  morphine 4 mg IV every 4 hours as needed for severe pain, 20 hours ordered  Pulmonary hypertension (HCC) Patient wears 3 L [place] continuously  Chronic pain syndrome Home tramadol not resumed on admission, PDMP reviewed  Major depressive disorder, recurrent, severe w/o psychotic behavior (HCC) Home quetiapine 250 mg (confirmed filled with pharmacy) at bedtime resumed  Benign hypertension Hydralazine 5 mg IV every 8 hours as needed for SBP greater 170, 4 days ordered  Chart reviewed.   DVT prophylaxis: Eliquis 5 mg BID Code Status: full code Diet: Heart healthy Family Communication: Pending clinical course, pending orthopedic recommendation Disposition Plan: Pending clinical course Consults called: EDP consulted with orthopedic service, Dr. Joice Lofts Admission status: Telemetry medical, inpatient  Past Medical History:  Diagnosis Date   Allergy    Anxiety    Asthma    Chronic pain syndrome    discharged from pain clinic, hx of narcotics seeking behavior   COPD (chronic obstructive pulmonary disease) (HCC)    CVA (cerebral infarction)    Depression    Fibromyalgia    Headache    Hyperlipidemia    Hypertension    IBS (irritable bowel syndrome)    Stroke (HCC)    Vitamin D deficiency    Past Surgical History:  Procedure Laterality Date   ABDOMINAL HYSTERECTOMY     APPENDECTOMY     BREAST EXCISIONAL BIOPSY  2011   Pt states lump removed, ? side, no scar seen   BREAST SURGERY  2011   biopsy   CERVICAL DISCECTOMY     CHOLECYSTECTOMY     SINUSOTOMY     Social History:  reports that she has been smoking cigarettes. She started smoking about 58 years ago. She has a 14.3 pack-year smoking history. She has never used smokeless tobacco. She reports that she does not drink alcohol and does not use drugs.  Allergies  Allergen Reactions   Bextra  [Valdecoxib]    Compazine [Prochlorperazine Edisylate]     Stroke-like symptoms   Lithium Carbonate     Leg weakness   Lyrica  [Pregabalin]    Family History  Problem Relation Age of Onset   Anxiety disorder Mother    Depression Mother    Breast cancer Mother 18   Cancer Father    Gallbladder disease Father    Alcohol abuse Father    Depression Father    Bipolar disorder Son    Family history: Family history reviewed and not pertinent.  Prior to Admission medications   Medication Sig Start Date End Date Taking? Authorizing Provider  acetaminophen (TYLENOL) 500 MG tablet Take 500 mg by mouth 2 (two) times daily as needed.    [provider]  albuterol (VENTOLIN HFA) 108 (90 Base) MCG/ACT inhaler Inhale 1-2 puffs into the lungs every 6 (six) hours as needed for wheezing or shortness of breath. 02/06/22   Karamalegos, Netta Neat, DO  apixaban (ELIQUIS) 5 MG TABS tablet Take 1 tablet (5 mg total) by mouth 2 (two) times daily. 08/30/22 09/29/22  Phineas Semen, MD  aspirin 81 MG chewable tablet Chew 81 mg by mouth daily.    [provider]  atorvastatin (LIPITOR) 40 MG tablet TAKE 1 TABLET BY MOUTH EVERY DAY Patient taking differently: Take 40 mg by mouth at bedtime. TAKE 1 TABLET BY MOUTH  EVERY DAY 12/26/21   Karamalegos, Netta Neat, DO  clobetasol ointment (TEMOVATE) 0.05 % Apply 1 Application topically daily.    [provider]  FLUoxetine (PROZAC) 20 MG capsule Take 1 capsule (20 mg total) by mouth daily. 08/28/22   Loyce Dys, MD  furosemide (LASIX) 20 MG tablet Take 2 tablets (40 mg total) by mouth daily. If swelling is less, can take only 1 pill. 02/21/21   Karamalegos, Netta Neat, DO  gabapentin (NEURONTIN) 300 MG capsule Take 900 mg by mouth 3 (three) times daily. Take with one 600 mg tablet for total 900 mg three times daily 07/01/22   [provider]  Melatonin 3 MG CAPS Take 10 mg by mouth at bedtime as needed (sleep).     [provider]  methocarbamol (ROBAXIN) 500 MG tablet Take 1 tablet (500 mg total) by mouth every 8 (eight) hours as needed for muscle  spasms. 04/24/22   Karamalegos, Netta Neat, DO  metoprolol succinate (TOPROL XL) 25 MG 24 hr tablet Take 1 tablet (25 mg total) by mouth daily. 08/30/22 08/30/23  Phineas Semen, MD  montelukast (SINGULAIR) 10 MG tablet TAKE 1 TABLET(10 MG) BY MOUTH AT BEDTIME Patient taking differently: Take 10 mg by mouth at bedtime. 03/13/22   Karamalegos, Netta Neat, DO  nitrofurantoin, macrocrystal-monohydrate, (MACROBID) 100 MG capsule Take 100 mg by mouth 2 (two) times daily.    [provider]  ondansetron (ZOFRAN-ODT) 4 MG disintegrating tablet Take 1 tablet (4 mg total) by mouth every 8 (eight) hours as needed for nausea or vomiting. 04/05/22   Karamalegos, Netta Neat, DO  potassium chloride (KLOR-CON) 10 MEQ tablet TAKE 1 TABLET(10 MEQ) BY MOUTH DAILY Patient taking differently: Take 10 mEq by mouth daily as needed. 05/13/22   Karamalegos, Netta Neat, DO  QUEtiapine (SEROQUEL) 50 MG tablet Take 1 tablet (50 mg total) by mouth at bedtime. Take total of 250 mg at night. Take along with 200 mg tab 09/04/22 11/03/22  Neysa Hotter, MD  traMADol (ULTRAM) 50 MG tablet Take 1 tablet (50 mg total) by mouth daily as needed. 09/04/22 10/04/22  Lorre Munroe, NP  TRELEGY ELLIPTA 200-62.5-25 MCG/ACT AEPB INHALE 1 PUFF INTO THE LUNGS DAILY 08/14/22   Salena Saner, MD   Physical Exam: Vitals:   09/05/22 1249 09/05/22 1251 09/05/22 1716 09/05/22 1744  BP:  (!) 133/57 (!) 127/42 128/65  Pulse:  73 88 75  Resp:  18 16 15   Temp:  98.4 F (36.9 C) 99.6 F (37.6 C)   TempSrc:   Oral   SpO2:  99% 100% 100%  Weight: 60.3 kg     Height: 5\' 5"  (1.651 m)      Constitutional: appears age appropriate, NAD, calm Eyes: PERRL, lids and conjunctivae normal ENMT: Mucous membranes are moist. Posterior pharynx clear of any exudate or lesions. Age-appropriate dentition. Hearing appropriate Neck: normal, supple, no masses, no thyromegaly Respiratory: clear to auscultation bilaterally, no wheezing, no crackles. Normal  respiratory effort. No accessory muscle use.  Cardiovascular: Regular rate and rhythm, no murmurs / rubs / gallops. No extremity edema. 2+ pedal pulses. No carotid bruits.  Abdomen: no tenderness, no masses palpated, no hepatosplenomegaly. Bowel sounds positive.  Musculoskeletal: no clubbing / cyanosis. No joint deformity upper and lower extremities. Decreased ROM of the left lower extremity, no contractures, no atrophy. Normal muscle tone.  Skin: no rashes, lesions, ulcers. No induration. Left lower extremity pain Neurologic: Sensation intact. Strength 5/5 in all 4.  Psychiatric: Normal  judgment and insight. Alert and oriented x 3. Normal mood.   EKG: independently reviewed, showing sinus rhythm with rate of 88, QTc 459  Chest x-ray on Admission: I personally reviewed and I agree with radiologist reading as below.  CT Lumbar Spine Wo Contrast  Result Date: 09/05/2022 CLINICAL DATA:  Back trauma.  Fall. EXAM: CT LUMBAR SPINE WITHOUT CONTRAST TECHNIQUE: Multidetector CT imaging of the lumbar spine was performed without intravenous contrast administration. Multiplanar CT image reconstructions were also generated. RADIATION DOSE REDUCTION: This exam was performed according to the departmental dose-optimization program which includes automated exposure control, adjustment of the mA and/or kV according to patient size and/or use of iterative reconstruction technique. COMPARISON:  Lumbar spine radiographs 08/12/2022. FINDINGS: Segmentation: Conventional numbering is assumed with 5 non-rib-bearing, lumbar type vertebral bodies. Alignment: Normal. Vertebrae: Unchanged chronic compression deformity of the T12 vertebral body with moderate anterior height loss. No new fracture. Multilevel degenerative endplate changes. Paraspinal and other soft tissues: Atherosclerotic calcifications of the abdominal aorta and its branches. Disc levels: Multilevel lumbar spondylosis, worst at L4-5, where there is at least moderate  spinal canal stenosis. IMPRESSION: 1. No acute fracture or traumatic malalignment of the lumbar spine. 2. Unchanged chronic compression deformity of the T12 vertebral body with moderate anterior height loss. 3. Multilevel lumbar spondylosis, worst at L4-5, where there is at least moderate spinal canal stenosis. Aortic Atherosclerosis (ICD10-I70.0). Electronically Signed   By: Orvan Falconer M.D.   On: 09/05/2022 14:48   CT HEAD WO CONTRAST ( )  Result Date: 09/05/2022 CLINICAL DATA:  Head trauma, minor (Age >= 65y); Neck trauma (Age >= 65y). Fall. EXAM: CT HEAD WITHOUT CONTRAST CT CERVICAL SPINE WITHOUT CONTRAST TECHNIQUE: Multidetector CT imaging of the head and cervical spine was performed following the standard protocol without intravenous contrast. Multiplanar CT image reconstructions of the cervical spine were also generated. RADIATION DOSE REDUCTION: This exam was performed according to the departmental dose-optimization program which includes automated exposure control, adjustment of the mA and/or kV according to patient size and/or use of iterative reconstruction technique. COMPARISON:  Head CT and brain MRI 03/24/2022. MRI cervical spine 03/24/2022. FINDINGS: CT HEAD FINDINGS Brain: Within limits of motion artifact, no acute hemorrhage. Unchanged mild chronic small-vessel disease with old lacunar infarct in the right lentiform nucleus. No hydrocephalus or extra-axial collection. No mass effect or midline shift. Vascular: No hyperdense vessel or unexpected calcification. Skull: No calvarial fracture or suspicious bone lesion. Skull base is unremarkable. Sinuses/Orbits: No acute finding. Other: None. CT CERVICAL SPINE FINDINGS Alignment: Normal. Skull base and vertebrae: Prior C5-C7 ACDF with solid bony fusion across the intervening disc spaces. Hardware is intact. No associated lucency. No acute fracture. Craniocervical junction is intact. Soft tissues and spinal canal: No prevertebral fluid or  swelling. No visible canal hematoma. Disc levels: Mild cervical spondylosis without high-grade spinal canal stenosis. Upper chest: Emphysema in the lung apices. Other: None. IMPRESSION: 1. No acute intracranial abnormality. 2. No acute cervical spine fracture or traumatic listhesis. 3. Prior C5-C7 ACDF with solid bony fusion across the intervening disc spaces. Electronically Signed   By: Orvan Falconer M.D.   On: 09/05/2022 14:32   CT Cervical Spine Wo Contrast  Result Date: 09/05/2022 CLINICAL DATA:  Head trauma, minor (Age >= 65y); Neck trauma (Age >= 65y). Fall. EXAM: CT HEAD WITHOUT CONTRAST CT CERVICAL SPINE WITHOUT CONTRAST TECHNIQUE: Multidetector CT imaging of the head and cervical spine was performed following the standard protocol without intravenous contrast. Multiplanar CT image reconstructions of the  cervical spine were also generated. RADIATION DOSE REDUCTION: This exam was performed according to the departmental dose-optimization program which includes automated exposure control, adjustment of the mA and/or kV according to patient size and/or use of iterative reconstruction technique. COMPARISON:  Head CT and brain MRI 03/24/2022. MRI cervical spine 03/24/2022. FINDINGS: CT HEAD FINDINGS Brain: Within limits of motion artifact, no acute hemorrhage. Unchanged mild chronic small-vessel disease with old lacunar infarct in the right lentiform nucleus. No hydrocephalus or extra-axial collection. No mass effect or midline shift. Vascular: No hyperdense vessel or unexpected calcification. Skull: No calvarial fracture or suspicious bone lesion. Skull base is unremarkable. Sinuses/Orbits: No acute finding. Other: None. CT CERVICAL SPINE FINDINGS Alignment: Normal. Skull base and vertebrae: Prior C5-C7 ACDF with solid bony fusion across the intervening disc spaces. Hardware is intact. No associated lucency. No acute fracture. Craniocervical junction is intact. Soft tissues and spinal canal: No prevertebral  fluid or swelling. No visible canal hematoma. Disc levels: Mild cervical spondylosis without high-grade spinal canal stenosis. Upper chest: Emphysema in the lung apices. Other: None. IMPRESSION: 1. No acute intracranial abnormality. 2. No acute cervical spine fracture or traumatic listhesis. 3. Prior C5-C7 ACDF with solid bony fusion across the intervening disc spaces. Electronically Signed   By: Orvan Falconer M.D.   On: 09/05/2022 14:32   DG Tibia/Fibula Left  Result Date: 09/05/2022 CLINICAL DATA:  pain injury EXAM: LEFT TIBIA AND FIBULA - 2 VIEW COMPARISON:  Ankle radiograph from earlier the same day. FINDINGS: There is acute minimally displaced oblique fracture of the later malleolus. Acute fracture of the medial malleolus was better seen on the prior ankle radiograph. No other acute fracture or dislocation. No aggressive osseous lesion. The knee joint appears within normal limits. No significant arthritis. Ankle mortise appears intact. No focal soft tissue swelling. No radiopaque foreign bodies. IMPRESSION: Acute minimally displaced fracture of the lateral malleolus. Electronically Signed   By: Jules Schick M.D.   On: 09/05/2022 14:18   DG Hip Unilat W or Wo Pelvis 2-3 Views Left  Result Date: 09/05/2022 CLINICAL DATA:  pain injury EXAM: DG HIP (WITH OR WITHOUT PELVIS) 2-3V LEFT COMPARISON:  None Available. FINDINGS: Pelvis is intact with normal and symmetric sacroiliac joints. No acute fracture or dislocation. No aggressive osseous lesion. Visualized sacral arcuate lines are unremarkable. Unremarkable symphysis pubis. There are mild degenerative changes of bilateral hip joints without significant joint space narrowing. Osteophytosis of the superior acetabulum. No radiopaque foreign bodies. IMPRESSION: *No acute fracture or dislocation. Mild degenerative changes of the hip joints. Electronically Signed   By: Jules Schick M.D.   On: 09/05/2022 14:14   DG Ankle Complete Left  Result Date:  09/05/2022 CLINICAL DATA:  fall EXAM: LEFT ANKLE COMPLETE - 3+ VIEW COMPARISON:  None Available. FINDINGS: Acute minimally displaced transsyndesmotic distal fibular fracture. Acute displaced medial malleolar avulsion fracture. Slight cortical irregularity of the posterior malleolus suggestive of a nondisplaced fracture. There is no evidence of severe arthropathy or other focal bone abnormality. Lateral greater than medial subcutaneus soft tissue edema. IMPRESSION: 1. Acute minimally displaced transsyndesmotic distal fibular fracture. 2. Acute displaced medial malleolar avulsion fracture. 3. Slight cortical irregularity of the posterior malleolus suggestive of a nondisplaced fracture. Electronically Signed   By: Tish Frederickson M.D.   On: 09/05/2022 13:48    Labs on Admission: I have personally reviewed following labs  CBC: Recent Labs  Lab 08/30/22 1530 09/05/22 1502  WBC 9.6 12.0*  NEUTROABS 5.3 9.8*  HGB 12.1 10.8*  HCT 36.7 33.9*  MCV 93.4 96.9  PLT 332 325   Basic Metabolic Panel: Recent Labs  Lab 08/30/22 1530 09/05/22 1502  NA 138 139  K 3.6 4.6  CL 101 102  CO2 27 26  GLUCOSE 116* 109*  BUN 25* 26*  CREATININE 0.86 0.97  CALCIUM 9.0 9.0   GFR: Estimated Creatinine Clearance: 43 mL/min (by C-G formula based on SCr of 0.97 mg/dL).  Liver Function Tests: Recent Labs  Lab 09/05/22 1502  AST 360*  ALT 241*  ALKPHOS 297*  BILITOT 1.6*  PROT 6.6  ALBUMIN 3.1*   Urine analysis:    Component Value Date/Time   COLORURINE STRAW (A) 08/25/2022 1740   APPEARANCEUR CLEAR (A) 08/25/2022 1740   APPEARANCEUR Hazy 12/25/2012 1507   LABSPEC 1.006 08/25/2022 1740   LABSPEC 1.015 12/25/2012 1507   PHURINE 5.0 08/25/2022 1740   GLUCOSEU NEGATIVE 08/25/2022 1740   GLUCOSEU Negative 12/25/2012 1507   HGBUR NEGATIVE 08/25/2022 1740   BILIRUBINUR NEGATIVE 08/25/2022 1740   BILIRUBINUR Negative 06/26/2022 1525   BILIRUBINUR Negative 12/25/2012 1507   KETONESUR 5 (A) 08/25/2022  1740   PROTEINUR NEGATIVE 08/25/2022 1740   UROBILINOGEN 0.2 06/26/2022 1525   NITRITE NEGATIVE 08/25/2022 1740   LEUKOCYTESUR NEGATIVE 08/25/2022 1740   LEUKOCYTESUR 3+ 12/25/2012 1507   This document was prepared using Dragon Voice Recognition software and may include unintentional dictation errors.  Dr. Sedalia Muta [facility]  If 7PM-7AM, please contact overnight-coverage provider If 7AM-7PM, please contact day attending provider [email or web address]  09/05/2022, 7:07 PM

## 2022-09-05 NOTE — ED Notes (Signed)
RN talked to [facility] on 1A about pt coming up to the room. Latoya sts that the nurse is ready for pt.

## 2022-09-05 NOTE — Telephone Encounter (Signed)
noted 

## 2022-09-05 NOTE — Assessment & Plan Note (Signed)
Presumed secondary to left lateral malleolus acute fracture that is minimally displaced EDP discussed case with orthopedic provider on-call Dr. Joice Lofts who will evaluate the patient to determine if this is a surgical case Pain control: Oxycodone 5 mg p.o. every 6 hours as needed for moderate pain, 2 days ordered; morphine 4 mg IV every 4 hours as needed for severe pain, 20 hours of coverage ordered

## 2022-09-05 NOTE — Assessment & Plan Note (Addendum)
Home furosemide 40 mg daily resumed for 8/9, metoprolol succinate 25 mg daily resumed for 8/8 Hydralazine 5 mg q8h prn for sbp > 170 mmHg, 4 days

## 2022-09-05 NOTE — Assessment & Plan Note (Signed)
Home quetiapine 250 mg (confirmed filled with pharmacy) at bedtime resumed

## 2022-09-05 NOTE — ED Provider Notes (Signed)
  [facility] (1A) Provider Note    .Ortho Injury Treatment  Date/Time: 09/05/2022 9:09 PM  Performed by: Evon Slack, PA-C Authorized by: Evon Slack, PA-C   Consent:    Consent obtained:  Verbal   Consent given by:  Patient   Alternatives discussed:  No treatmentInjury location: ankle Location details: left ankle Injury type: fracture Fracture type: lateral malleolus Pre-procedure neurovascular assessment: neurovascularly intact Pre-procedure distal perfusion: normal Pre-procedure neurological function: normal Pre-procedure range of motion: reduced  Anesthesia: Local anesthesia used: no  Patient sedated: NoManipulation performed: no Immobilization: splint Splint type: short leg and ankle stirrup Splint Applied by: ED Provider Supplies used: cotton padding, elastic bandage and Ortho-Glass Post-procedure neurovascular assessment: post-procedure neurovascularly intact Post-procedure distal perfusion: normal Post-procedure neurological function: normal Post-procedure range of motion: unchanged       Beth [name] 09/05/22 2111    Willy Eddy, MD 09/06/22 1457

## 2022-09-05 NOTE — Assessment & Plan Note (Signed)
Ativan 0.5 mg p.o. every 6 hours as needed for anxiety, 1 day ordered

## 2022-09-05 NOTE — ED Triage Notes (Signed)
First nurse note: Pt to ED [facility] from home for fall this am. C/o left ankle pain. Denies hitting head  VSS

## 2022-09-05 NOTE — ED Triage Notes (Signed)
Pt reports slipped over [place], wears 2 L chronic.

## 2022-09-05 NOTE — Hospital Course (Addendum)
[name] is a 77 year old female with history of depression, anxiety, neuropathy, insomnia, atrial fibrillation, on Eliquis, who presents to the emergency department for chief concerns of a fall in the morning 09/05/22 - Reports that she tripped over her oxygen cord and fell. She denies loss of consciousness. She denies head trauma  08/08: to ED. Left lateral malleolus acute fracture that is minimally displaced. Admitted for ortho consult and pain control, may need SNF/STR. Per ortho (Dr Joice Lofts) plan for nonsurgical treatment w/ splint 08/09: PT/OT recs pending.    Consultants:  Orthopedics  Procedures: none      ASSESSMENT & PLAN:   Principal Problem:   Inadequate pain control Active Problems:   HLD (hyperlipidemia)   HTN (hypertension)   Anxiety and depression   CVA, old, hemiparesis (HCC)   Fibromyalgia syndrome   Benign hypertension   Chronic recurrent major depressive disorder (HCC)   Major depressive disorder, recurrent, severe w/o psychotic behavior (HCC)   Chronic pain syndrome   Atherosclerosis of aorta (HCC)   Pulmonary hypertension (HCC)   Asthma with COPD   Closed fracture of left lateral malleolus   Atrial fibrillation, chronic (HCC)   Acute Left lateral malleolus acute fracture that is minimally displaced Mechanical Fall  Orthopedics following - conservative nonsurgical management PT/OT to see, may need SNF/STR  Pain control Home Gabapentin 900 mg tid Oxycodone 5 mg po q6h prn mod/severe Morphine 4 mg q4h prn breakthrough not controlled by po oxycodone    Atrial fibrillation, chronic (HCC) Patient's last dose of Eliquis was evening of 8/7 Eliquis 5 mg BID resumed on admission Metoprolol succinate 25 mg daily resumed  HLD (hyperlipidemia) Home atorvastatin 40 mg nightly resumed   HTN (hypertension) Home furosemide 40 mg daily resumed for 8/9, metoprolol succinate 25 mg daily resumed for 8/8 Hydralazine 5 mg q8h prn for sbp > 170 mmHg, 4  days   Anxiety and depression Ativan 0.5 mg p.o. every 6 hours as needed for anxiety, 1 day ordered   COPD/Asthma Albuterol prn Breo + Incruse, Singulair   Pulmonary hypertension (HCC) Patient wears 3 L [place] continuously Continue O2 support   Chronic pain syndrome Home tramadol not resumed on admission, PDMP reviewed Pain control for ankle fracture - see above   Major depressive disorder, recurrent, severe w/o psychotic behavior (HCC) Home quetiapine 250 mg (confirmed filled with pharmacy) at bedtime resumed      DVT prophylaxis: eliquis Pertinent IV fluids/nutrition: no contiuous IV fluids  Central lines / invasive devices: none   Code Status: FULL CODE ACP documentation reviewed: 09/06/22 none on file   Current Admission Status: inpatient   TOC needs / Dispo plan: TBD likely will need HH/SNF Barriers to discharge / significant pending items: PT/OT pending

## 2022-09-05 NOTE — Assessment & Plan Note (Addendum)
Home tramadol not resumed on admission, PDMP reviewed

## 2022-09-05 NOTE — Assessment & Plan Note (Signed)
Patient wears Beth [name] continuously

## 2022-09-05 NOTE — Consult Note (Signed)
ORTHOPAEDIC CONSULTATION  REQUESTING PHYSICIAN: Cox, Amy N, DO  Chief Complaint:   Left ankle pain and swelling.  History of Present Illness: Beth [name] is a 77 y.o. female with multiple medical problems including home O2 dependent COPD, asthma, fibromyalgia, hypertension, irritable bowel syndrome, chronic pain syndrome, anxiety/depression, and chronic atrial fibrillation for which she is on Eliquis.  The patient lives alone.  Apparently, she awoke this morning from sleeping on her couch.  As she went to get up, she recalls tripping over her O2 cord and fell, twisting her left ankle.  She was brought to the emergency room where x-rays demonstrated a minimally displaced left distal fibular fracture with a tiny avulsion fracture off the medial malleolus and a questionable nondisplaced cortical defect involving the posterior malleolar region.  The patient was placed into a posterior splint and admitted for pain control and probable rehab placement as she is unable to care for herself at home.  The patient denies any associated injuries.  She did not strike her head or lose consciousness as a result of the fall.  She also denies any lightheadedness, dizziness, chest pain, shortness of breath, or other symptoms which may have precipitated her fall.  Past Medical History:  Diagnosis Date   Allergy    Anxiety    Asthma    Chronic pain syndrome    discharged from pain clinic, hx of narcotics seeking behavior   COPD (chronic obstructive pulmonary disease) (HCC)    CVA (cerebral infarction)    Depression    Fibromyalgia    Headache    Hyperlipidemia    Hypertension    IBS (irritable bowel syndrome)    Stroke (HCC)    Vitamin D deficiency    Past Surgical History:  Procedure Laterality Date   ABDOMINAL HYSTERECTOMY     APPENDECTOMY     BREAST EXCISIONAL BIOPSY  2011   Pt states lump removed, ? side, no scar seen   BREAST  SURGERY  2011   biopsy   CERVICAL DISCECTOMY     CHOLECYSTECTOMY     SINUSOTOMY     Social History   Socioeconomic History   Marital status: Legally Separated    Spouse name: Not on file   Number of children: 2   Years of education: Not on file   Highest education level: Not on file  Occupational History   Occupation: retired  Tobacco Use   Smoking status: Some Days    Current packs/day: 0.00    Average packs/day: 0.3 packs/day for 57.0 years (14.3 ttl pk-yrs)    Types: Cigarettes    Start date: 10/25/1963    Last attempt to quit: 10/24/2020    Years since quitting: 1.8   Smokeless tobacco: Never   Tobacco comments:    16 cigarettes a week. Khj 07/26/2022  Vaping Use   Vaping status: Never Used  Substance and Sexual Activity   Alcohol use: No    Alcohol/week: 0.0 standard drinks of alcohol   Drug use: No   Sexual activity: Not Currently  Other Topics Concern   Not on file  Social History Narrative   Not on file   Social Determinants of Health   Financial Resource Strain: Low Risk  (02/01/2022)   Overall Financial Resource Strain (CARDIA)    Difficulty of Paying Living Expenses: Not hard at all  Food Insecurity: No Food Insecurity (08/25/2022)   Hunger Vital Sign    Worried About Running Out of Food in the Last Year: Never  true    Ran Out of Food in the Last Year: Never true  Transportation Needs: No Transportation Needs (08/25/2022)   PRAPARE - Administrator, Civil Service (Medical): No    Lack of Transportation (Non-Medical): No  Physical Activity: Insufficiently Active (02/01/2022)   Exercise Vital Sign    Days of Exercise per Week: 7 days    Minutes of Exercise per Session: 20 min  Stress: No Stress Concern Present (02/01/2022)   [employer] of Occupational Health - Occupational Stress Questionnaire    Feeling of Stress : Only a little  Social Connections: Socially Isolated (02/01/2022)   Social Connection and Isolation Panel [NHANES]    Frequency  of Communication with Friends and Family: Twice a week    Frequency of Social Gatherings with Friends and Family: Once a week    Attends Religious Services: Never    Database administrator or Organizations: No    Attends Engineer, structural: Never    Marital Status: Separated   Family History  Problem Relation Age of Onset   Anxiety disorder Mother    Depression Mother    Breast cancer Mother 56   Cancer Father    Gallbladder disease Father    Alcohol abuse Father    Depression Father    Bipolar disorder Son    Allergies  Allergen Reactions   Bextra  [Valdecoxib]    Compazine [Prochlorperazine Edisylate]     Stroke-like symptoms   Lithium Carbonate     Leg weakness   Lyrica [Pregabalin]    Prior to Admission medications   Medication Sig Start Date End Date Taking? Authorizing Provider  acetaminophen (TYLENOL) 500 MG tablet Take 500 mg by mouth 2 (two) times daily as needed.   Yes [provider]  albuterol (VENTOLIN HFA) 108 (90 Base) MCG/ACT inhaler Inhale 1-2 puffs into the lungs every 6 (six) hours as needed for wheezing or shortness of breath. 02/06/22  Yes Karamalegos, Netta Neat, DO  apixaban (ELIQUIS) 5 MG TABS tablet Take 1 tablet (5 mg total) by mouth 2 (two) times daily. 08/30/22 09/29/22 Yes Phineas Semen, MD  atorvastatin (LIPITOR) 40 MG tablet TAKE 1 TABLET BY MOUTH EVERY DAY Patient taking differently: Take 40 mg by mouth at bedtime. TAKE 1 TABLET BY MOUTH EVERY DAY 12/26/21  Yes Karamalegos, Netta Neat, DO  clobetasol ointment (TEMOVATE) 0.05 % Apply 1 Application topically daily.   Yes [provider]  FLUoxetine (PROZAC) 20 MG capsule Take 1 capsule (20 mg total) by mouth daily. 08/28/22  Yes Loyce Dys, MD  furosemide (LASIX) 20 MG tablet Take 2 tablets (40 mg total) by mouth daily. If swelling is less, can take only 1 pill. 02/21/21  Yes Karamalegos, Netta Neat, DO  gabapentin (NEURONTIN) 300 MG capsule Take 900 mg by mouth 3  (three) times daily. Take with one 600 mg tablet for total 900 mg three times daily 07/01/22  Yes [provider]  Melatonin 3 MG CAPS Take 10 mg by mouth at bedtime as needed (sleep).    Yes [provider]  metoprolol succinate (TOPROL XL) 25 MG 24 hr tablet Take 1 tablet (25 mg total) by mouth daily. 08/30/22 08/30/23 Yes Phineas Semen, MD  montelukast (SINGULAIR) 10 MG tablet TAKE 1 TABLET(10 MG) BY MOUTH AT BEDTIME Patient taking differently: Take 10 mg by mouth at bedtime. 03/13/22  Yes Karamalegos, Netta Neat, DO  potassium chloride (KLOR-CON) 10 MEQ tablet TAKE 1 TABLET(10 MEQ) BY MOUTH  DAILY Patient taking differently: Take 10 mEq by mouth daily. 05/13/22  Yes Karamalegos, Netta Neat, DO  QUEtiapine (SEROQUEL) 50 MG tablet Take 1 tablet (50 mg total) by mouth at bedtime. Take total of 250 mg at night. Take along with 200 mg tab 09/04/22 11/03/22 Yes Hisada, Barbee Cough, MD  Harrel Carina ELLIPTA 200-62.5-25 MCG/ACT AEPB INHALE 1 PUFF INTO THE LUNGS DAILY 08/14/22  Yes Salena Saner, MD  aspirin 81 MG chewable tablet Chew 81 mg by mouth daily. Patient not taking: Reported on 09/05/2022    [provider]  methocarbamol (ROBAXIN) 500 MG tablet Take 1 tablet (500 mg total) by mouth every 8 (eight) hours as needed for muscle spasms. Patient not taking: Reported on 09/05/2022 04/24/22   Smitty Cords, DO  nitrofurantoin, macrocrystal-monohydrate, (MACROBID) 100 MG capsule Take 100 mg by mouth 2 (two) times daily. Patient not taking: Reported on 09/05/2022    [provider]  ondansetron (ZOFRAN-ODT) 4 MG disintegrating tablet Take 1 tablet (4 mg total) by mouth every 8 (eight) hours as needed for nausea or vomiting. Patient not taking: Reported on 09/05/2022 04/05/22   Smitty Cords, DO  traMADol (ULTRAM) 50 MG tablet Take 1 tablet (50 mg total) by mouth daily as needed. Patient not taking: Reported on 09/05/2022 09/04/22 10/04/22  Lorre Munroe, NP   CT Lumbar  Spine Wo Contrast  Result Date: 09/05/2022 CLINICAL DATA:  Back trauma.  Fall. EXAM: CT LUMBAR SPINE WITHOUT CONTRAST TECHNIQUE: Multidetector CT imaging of the lumbar spine was performed without intravenous contrast administration. Multiplanar CT image reconstructions were also generated. RADIATION DOSE REDUCTION: This exam was performed according to the departmental dose-optimization program which includes automated exposure control, adjustment of the mA and/or kV according to patient size and/or use of iterative reconstruction technique. COMPARISON:  Lumbar spine radiographs 08/12/2022. FINDINGS: Segmentation: Conventional numbering is assumed with 5 non-rib-bearing, lumbar type vertebral bodies. Alignment: Normal. Vertebrae: Unchanged chronic compression deformity of the T12 vertebral body with moderate anterior height loss. No new fracture. Multilevel degenerative endplate changes. Paraspinal and other soft tissues: Atherosclerotic calcifications of the abdominal aorta and its branches. Disc levels: Multilevel lumbar spondylosis, worst at L4-5, where there is at least moderate spinal canal stenosis. IMPRESSION: 1. No acute fracture or traumatic malalignment of the lumbar spine. 2. Unchanged chronic compression deformity of the T12 vertebral body with moderate anterior height loss. 3. Multilevel lumbar spondylosis, worst at L4-5, where there is at least moderate spinal canal stenosis. Aortic Atherosclerosis (ICD10-I70.0). Electronically Signed   By: Orvan Falconer M.D.   On: 09/05/2022 14:48   CT HEAD WO CONTRAST ( )  Result Date: 09/05/2022 CLINICAL DATA:  Head trauma, minor (Age >= 65y); Neck trauma (Age >= 65y). Fall. EXAM: CT HEAD WITHOUT CONTRAST CT CERVICAL SPINE WITHOUT CONTRAST TECHNIQUE: Multidetector CT imaging of the head and cervical spine was performed following the standard protocol without intravenous contrast. Multiplanar CT image reconstructions of the cervical spine were also generated.  RADIATION DOSE REDUCTION: This exam was performed according to the departmental dose-optimization program which includes automated exposure control, adjustment of the mA and/or kV according to patient size and/or use of iterative reconstruction technique. COMPARISON:  Head CT and brain MRI 03/24/2022. MRI cervical spine 03/24/2022. FINDINGS: CT HEAD FINDINGS Brain: Within limits of motion artifact, no acute hemorrhage. Unchanged mild chronic small-vessel disease with old lacunar infarct in the right lentiform nucleus. No hydrocephalus or extra-axial collection. No mass effect or midline shift. Vascular: No hyperdense vessel or unexpected calcification.  Skull: No calvarial fracture or suspicious bone lesion. Skull base is unremarkable. Sinuses/Orbits: No acute finding. Other: None. CT CERVICAL SPINE FINDINGS Alignment: Normal. Skull base and vertebrae: Prior C5-C7 ACDF with solid bony fusion across the intervening disc spaces. Hardware is intact. No associated lucency. No acute fracture. Craniocervical junction is intact. Soft tissues and spinal canal: No prevertebral fluid or swelling. No visible canal hematoma. Disc levels: Mild cervical spondylosis without high-grade spinal canal stenosis. Upper chest: Emphysema in the lung apices. Other: None. IMPRESSION: 1. No acute intracranial abnormality. 2. No acute cervical spine fracture or traumatic listhesis. 3. Prior C5-C7 ACDF with solid bony fusion across the intervening disc spaces. Electronically Signed   By: Orvan Falconer M.D.   On: 09/05/2022 14:32   CT Cervical Spine Wo Contrast  Result Date: 09/05/2022 CLINICAL DATA:  Head trauma, minor (Age >= 65y); Neck trauma (Age >= 65y). Fall. EXAM: CT HEAD WITHOUT CONTRAST CT CERVICAL SPINE WITHOUT CONTRAST TECHNIQUE: Multidetector CT imaging of the head and cervical spine was performed following the standard protocol without intravenous contrast. Multiplanar CT image reconstructions of the cervical spine were also  generated. RADIATION DOSE REDUCTION: This exam was performed according to the departmental dose-optimization program which includes automated exposure control, adjustment of the mA and/or kV according to patient size and/or use of iterative reconstruction technique. COMPARISON:  Head CT and brain MRI 03/24/2022. MRI cervical spine 03/24/2022. FINDINGS: CT HEAD FINDINGS Brain: Within limits of motion artifact, no acute hemorrhage. Unchanged mild chronic small-vessel disease with old lacunar infarct in the right lentiform nucleus. No hydrocephalus or extra-axial collection. No mass effect or midline shift. Vascular: No hyperdense vessel or unexpected calcification. Skull: No calvarial fracture or suspicious bone lesion. Skull base is unremarkable. Sinuses/Orbits: No acute finding. Other: None. CT CERVICAL SPINE FINDINGS Alignment: Normal. Skull base and vertebrae: Prior C5-C7 ACDF with solid bony fusion across the intervening disc spaces. Hardware is intact. No associated lucency. No acute fracture. Craniocervical junction is intact. Soft tissues and spinal canal: No prevertebral fluid or swelling. No visible canal hematoma. Disc levels: Mild cervical spondylosis without high-grade spinal canal stenosis. Upper chest: Emphysema in the lung apices. Other: None. IMPRESSION: 1. No acute intracranial abnormality. 2. No acute cervical spine fracture or traumatic listhesis. 3. Prior C5-C7 ACDF with solid bony fusion across the intervening disc spaces. Electronically Signed   By: Orvan Falconer M.D.   On: 09/05/2022 14:32   DG Tibia/Fibula Left  Result Date: 09/05/2022 CLINICAL DATA:  pain injury EXAM: LEFT TIBIA AND FIBULA - 2 VIEW COMPARISON:  Ankle radiograph from earlier the same day. FINDINGS: There is acute minimally displaced oblique fracture of the later malleolus. Acute fracture of the medial malleolus was better seen on the prior ankle radiograph. No other acute fracture or dislocation. No aggressive osseous  lesion. The knee joint appears within normal limits. No significant arthritis. Ankle mortise appears intact. No focal soft tissue swelling. No radiopaque foreign bodies. IMPRESSION: Acute minimally displaced fracture of the lateral malleolus. Electronically Signed   By: Jules Schick M.D.   On: 09/05/2022 14:18   DG Hip Unilat W or Wo Pelvis 2-3 Views Left  Result Date: 09/05/2022 CLINICAL DATA:  pain injury EXAM: DG HIP (WITH OR WITHOUT PELVIS) 2-3V LEFT COMPARISON:  None Available. FINDINGS: Pelvis is intact with normal and symmetric sacroiliac joints. No acute fracture or dislocation. No aggressive osseous lesion. Visualized sacral arcuate lines are unremarkable. Unremarkable symphysis pubis. There are mild degenerative changes of bilateral hip joints without  significant joint space narrowing. Osteophytosis of the superior acetabulum. No radiopaque foreign bodies. IMPRESSION: *No acute fracture or dislocation. Mild degenerative changes of the hip joints. Electronically Signed   By: Jules Schick M.D.   On: 09/05/2022 14:14   DG Ankle Complete Left  Result Date: 09/05/2022 CLINICAL DATA:  fall EXAM: LEFT ANKLE COMPLETE - 3+ VIEW COMPARISON:  None Available. FINDINGS: Acute minimally displaced transsyndesmotic distal fibular fracture. Acute displaced medial malleolar avulsion fracture. Slight cortical irregularity of the posterior malleolus suggestive of a nondisplaced fracture. There is no evidence of severe arthropathy or other focal bone abnormality. Lateral greater than medial subcutaneus soft tissue edema. IMPRESSION: 1. Acute minimally displaced transsyndesmotic distal fibular fracture. 2. Acute displaced medial malleolar avulsion fracture. 3. Slight cortical irregularity of the posterior malleolus suggestive of a nondisplaced fracture. Electronically Signed   By: Tish Frederickson M.D.   On: 09/05/2022 13:48    Positive ROS: All other systems have been reviewed and were otherwise negative with the  exception of those mentioned in the HPI and as above.  Physical Exam: General:  Alert, no acute distress Psychiatric:  Patient is competent for consent with normal mood and affect   Cardiovascular:  No pedal edema Respiratory:  No wheezing, non-labored breathing GI:  Abdomen is soft and non-tender Skin:  No lesions in the area of chief complaint Neurologic:  Sensation intact distally Lymphatic:  No axillary or cervical lymphadenopathy  Orthopedic Exam:  Orthopedic exam is limited to the left foot and lower leg.  The patient is in a posterior splint with a sugar-tong supplement.  Skin inspection at the proximal and distal ends of the splint is unremarkable.  When the lower part of the wrap is taken down, she does have moderate tenderness to palpation over the lateral aspect of the distal fibula, but only mild tenderness palpation medially.  No swelling, erythema, ecchymosis, braces, or other skin abnormalities are identified.  She is able to dorsiflex and plantarflex her toes, and has intact sensation to light touch to all digits.  She has good capillary refill to all digits.  X-rays:  AP, lateral, and mortise views of the left ankle are available for review and have been reviewed by myself.  These films demonstrate an essentially nondisplaced distal fibular fracture as well as a tiny essentially nondisplaced avulsion fracture off the inferior most tip of the medial malleolus.  The mortise appears to be aligned anatomically.  No significant degenerative changes of the mortise are noted.  Assessment: Essentially nondisplaced left distal fibular fracture.  Plan: The treatment options have been discussed with the patient and her son, who is at her bedside.  Based on the x-ray findings and her multiple medical issues, I feel that it is reasonable to try to attempt to treat this fracture nonsurgically.  The patient and her son are both quite comfortable with this plan.  The patient's present  posterior splint is maintaining her ankle at approximate 20 to 25 degrees of plantarflexion.  Therefore I have asked the ER providers to reapply the splint with her ankle in neutral dorsiflexion.  She may be mobilized with physical therapy, nonweightbearing on her left leg.  The leg is to be kept elevated and ice applied to the anterior aspect of her ankle is much as possible.  She may receive medication of pain as deemed appropriate from medical standpoint.  Given that she lives alone, it may indeed be the most prudent course for her to go to a rehab facility  for a period of time.  Thank you for asking me to participate in the care of this most pleasant yet unfortunate woman.  I will be happy to follow her with you.   Maryagnes Amos, MD  Beeper #:  [phone]  09/05/2022 7:13 PM

## 2022-09-05 NOTE — ED Provider Notes (Signed)
[facility] Provider Note    Event Date/Time   First MD Initiated Contact with Patient 09/05/22 1324     (approximate)   History   Fall   HPI  Beth [name] is a 77 y.o. female with history of CHF, CVA, A-fib, hypertension, osteoporosis, and vitamin D deficiency presents emergency department after a fall.  Patient was released from the hospital on 8/2 for episode of atrial fib and had also been admitted to the hospital on 7/21 and discharged on 7/28 for dyspnea.  Daughter states that the patient refused to be sent to rehab and they have been trying to use home health.  Today patient tripped over her nasal cannula and fell hitting the back of her head, neck, twisted the left ankle, and has hip and lower back pain.  No LOC.  Daughter states she appears to be in a lot of pain.  States she has chronic pain but this is much worse.  Patient denies chest pain/shortness of breath at this time.Marland Kitchen      Physical Exam   Triage Vital Signs: ED Triage Vitals  Encounter Vitals Group     BP 09/05/22 1251 (!) 133/57     Systolic BP Percentile --      Diastolic BP Percentile --      Pulse Rate 09/05/22 1251 73     Resp 09/05/22 1251 18     Temp 09/05/22 1251 98.4 F (36.9 C)     Temp src --      SpO2 09/05/22 1251 99 %     Weight 09/05/22 1249 133 lb (60.3 kg)     Height 09/05/22 1249 5\' 5"  (1.651 m)     Head Circumference --      Peak Flow --      Pain Score 09/05/22 1249 7     Pain Loc --      Pain Education --      Exclude from Growth Chart --     Most recent vital signs: Vitals:   09/05/22 1716 09/05/22 1744  BP: (!) 127/42 128/65  Pulse: 88   Resp: 16 15  Temp: 99.6 F (37.6 C)   SpO2: 100% 100%     General: Awake, no distress.   CV:  Good peripheral perfusion. regular rate and  rhythm Resp:  Normal effort.  Abd:  No distention.   Other:  Tender at the posterior skull, tender at the C-spine, tender lumbar spine, tender at the left hip, warmth  and swelling noted along the left lower extremity from the ankle all the way up to the thigh, ankle is tender to palpation's bruised and swollen, tib-fib is tender along the bony prominences, neurovascular appears to be intact   ED Results / Procedures / Treatments   Labs (all labs ordered are listed, but only abnormal results are displayed) Labs Reviewed  COMPREHENSIVE METABOLIC PANEL - Abnormal; Notable for the following components:      Result Value   Glucose, Bld 109 (*)    BUN 26 (*)    Albumin 3.1 (*)    AST 360 (*)    ALT 241 (*)    Alkaline Phosphatase 297 (*)    Total Bilirubin 1.6 (*)    GFR, Estimated 60 (*)    All other components within normal limits  CBC WITH DIFFERENTIAL/PLATELET - Abnormal; Notable for the following components:   WBC 12.0 (*)    RBC 3.50 (*)    Hemoglobin 10.8 (*)  HCT 33.9 (*)    Neutro Abs 9.8 (*)    All other components within normal limits  BASIC METABOLIC PANEL  CBC  TROPONIN I (HIGH SENSITIVITY)  TROPONIN I (HIGH SENSITIVITY)     EKG  EKG   RADIOLOGY CT of the head, cervical spine and lumbar spine X-ray of the left hip and pelvis, left tib-fib, left ankle Ultrasound left lower extremity    PROCEDURES:   Procedures   MEDICATIONS ORDERED IN ED: Medications  QUEtiapine (SEROQUEL) tablet 250 mg (has no administration in time range)  melatonin tablet 10 mg (has no administration in time range)  acetaminophen (TYLENOL) tablet 650 mg (has no administration in time range)    Or  acetaminophen (TYLENOL) suppository 650 mg (has no administration in time range)  ondansetron (ZOFRAN) tablet 4 mg (has no administration in time range)    Or  ondansetron (ZOFRAN) injection 4 mg (has no administration in time range)  hydrALAZINE (APRESOLINE) injection 5 mg (has no administration in time range)  oxyCODONE (Oxy IR/ROXICODONE) immediate release tablet 5 mg (has no administration in time range)  morphine (PF) 4 MG/ML injection 4 mg  (has no administration in time range)  LORazepam (ATIVAN) tablet 0.5 mg (has no administration in time range)  atorvastatin (LIPITOR) tablet 40 mg (has no administration in time range)  furosemide (LASIX) tablet 40 mg (has no administration in time range)  metoprolol succinate (TOPROL-XL) 24 hr tablet 25 mg (has no administration in time range)  FLUoxetine (PROZAC) capsule 20 mg (has no administration in time range)  gabapentin (NEURONTIN) capsule 900 mg (has no administration in time range)  albuterol (VENTOLIN HFA) 108 (90 Base) MCG/ACT inhaler 1-2 puff (has no administration in time range)  montelukast (SINGULAIR) tablet 10 mg (has no administration in time range)  fluticasone furoate-vilanterol (BREO ELLIPTA) 200-25 MCG/ACT 1 puff (has no administration in time range)    And  umeclidinium bromide (INCRUSE ELLIPTA) 62.5 MCG/ACT 1 puff (has no administration in time range)  morphine (PF) 4 MG/ML injection 4 mg (4 mg Intravenous Given 09/05/22 1459)     IMPRESSION / MDM / ASSESSMENT AND PLAN / ED COURSE  I reviewed the triage vital signs and the nursing notes.                              Differential diagnosis includes, but is not limited to, CVA, subdural, SAH, C-spine fracture, lumbar fracture, hip fracture, ankle fracture, DVT, A-fib  Patient's presentation is most consistent with acute presentation with potential threat to life or bodily function.   The patient appears to be very frail, we will go ahead and order a CT of the head due to her being tender at the posterior skull along with being on a blood thinner.  Will also do CT C-spine and lumbar spine as she is tender along with bony prominences and has osteoporosis.  X-ray of the left hip, left tib-fib and left ankle ordered due to tenderness along the extremities.  Also due to the left lower extremity being swollen and warm up to the thigh will go ahead and do ultrasound for DVT.  Labs ordered, EKG ordered due to her history of  A-fib   EKG showed normal sinus rhythm, see physician right  CT of the head, cervical spine and lumbar spine were independently reviewed interpreted by me as being negative for any acute abnormality  X-ray of the left ankle shows bilateral malleolus  fracture, minimal displacement, independently reviewed interpreted by me  X-ray of the left hip, tib-fib and are reassuring, tib-fib does show where the fracture is posterior.  Labs show elevated WBC of 12, decreased H&H, comprehensive metabolic panel most concerning with elevated liver enzymes, troponin is normal  Consult to hospitalist for admission, consult orthopedics for questionable surgery  Spoke with Dr.Poggi, does not think that she needs to have surgery as there is good alignment.  Would like for [place] to realign the splint  Spoke with Dr. Sedalia Muta for hospital admission due to pain, unable to bear weight, and will most likely need to be sent to a rehab facility.  Patient and the daughter are in agreement treatment plan.  She was admitted in stable condition.  FINAL CLINICAL IMPRESSION(S) / ED DIAGNOSES   Final diagnoses:  Closed fracture of left ankle, initial encounter  Inadequate pain control  Elevated liver enzymes     Rx / DC Orders   ED Discharge Orders     None        Note:  This document was prepared using Dragon voice recognition software and may include unintentional dictation errors.    Faythe Ghee, PA-C 09/05/22 1901    Willy Eddy, MD 09/05/22 [phone]

## 2022-09-05 NOTE — Assessment & Plan Note (Addendum)
Patient's last dose of Eliquis was evening of 8/7 Eliquis 5 mg BID resumed Metoprolol succinate 25 mg daily resumed

## 2022-09-05 NOTE — Assessment & Plan Note (Signed)
Acute, minimally displaced Oxycodone 5 mg p.o. every 6 hours as needed for moderate pain, 2 days ordered; morphine 4 mg IV every 4 hours as needed for severe pain, 20 hours ordered

## 2022-09-05 NOTE — Plan of Care (Signed)

## 2022-09-05 NOTE — Assessment & Plan Note (Signed)
Hydralazine 5 mg IV every 8 hours as needed for SBP greater 170, 4 days ordered

## 2022-09-05 NOTE — Assessment & Plan Note (Signed)
Home atorvastatin 40 mg nightly resumed

## 2022-09-05 NOTE — ED Notes (Signed)
Ortho wants pt splint to be redone. Delay to room 157.

## 2022-09-05 NOTE — Telephone Encounter (Signed)
Patient called, left VM to return the call to the office to speak to the NT.   Summary: question regarding Medication   Please call daughter regarding her mother's medication, Gabapentin and Tramadol, she would like some clarification

## 2022-09-06 ENCOUNTER — Ambulatory Visit: Payer: 59 | Admitting: Cardiology

## 2022-09-06 DIAGNOSIS — R52 Pain, unspecified: Secondary | ICD-10-CM | POA: Diagnosis not present

## 2022-09-06 LAB — URINALYSIS, W/ REFLEX TO CULTURE (INFECTION SUSPECTED)
Bilirubin Urine: NEGATIVE
Glucose, UA: NEGATIVE mg/dL
Hgb urine dipstick: NEGATIVE
Ketones, ur: NEGATIVE mg/dL
Nitrite: NEGATIVE
Protein, ur: NEGATIVE mg/dL
Specific Gravity, Urine: 1.015 (ref 1.005–1.030)
pH: 5 (ref 5.0–8.0)

## 2022-09-06 MED ORDER — GABAPENTIN 300 MG PO CAPS
600.0000 mg | ORAL_CAPSULE | Freq: Three times a day (TID) | ORAL | Status: DC
  Administered 2022-09-06 – 2022-09-07 (×2): 600 mg via ORAL
  Filled 2022-09-06 (×2): qty 2

## 2022-09-06 MED ORDER — MORPHINE SULFATE (PF) 2 MG/ML IV SOLN: 2.0000 mg | INTRAVENOUS | Status: DC | PRN

## 2022-09-06 MED ORDER — ACETAMINOPHEN 650 MG RE SUPP: 650.0000 mg | Freq: Four times a day (QID) | RECTAL | Status: DC | PRN

## 2022-09-06 MED ORDER — MIDODRINE HCL 5 MG PO TABS
10.0000 mg | ORAL_TABLET | Freq: Once | ORAL | Status: AC
  Administered 2022-09-06: 10 mg via ORAL
  Filled 2022-09-06: qty 2

## 2022-09-06 MED ORDER — OXYCODONE HCL 5 MG PO TABS
5.0000 mg | ORAL_TABLET | Freq: Four times a day (QID) | ORAL | Status: DC | PRN
  Administered 2022-09-06: 5 mg via ORAL
  Filled 2022-09-06 (×2): qty 1

## 2022-09-06 MED ORDER — MORPHINE SULFATE (PF) 2 MG/ML IV SOLN: 2.0000 mg | INTRAVENOUS | Status: AC | PRN

## 2022-09-06 MED ORDER — KETOROLAC TROMETHAMINE 15 MG/ML IJ SOLN
15.0000 mg | Freq: Once | INTRAMUSCULAR | Status: AC
  Administered 2022-09-06: 15 mg via INTRAVENOUS
  Filled 2022-09-06: qty 1

## 2022-09-06 MED ORDER — METOPROLOL SUCCINATE ER 25 MG PO TB24: 12.5000 mg | ORAL_TABLET | Freq: Every day | ORAL | Status: DC

## 2022-09-06 MED ORDER — KETOROLAC TROMETHAMINE 30 MG/ML IJ SOLN
15.0000 mg | Freq: Once | INTRAMUSCULAR | Status: AC
  Administered 2022-09-06: 15 mg via INTRAVENOUS
  Filled 2022-09-06: qty 1

## 2022-09-06 MED ORDER — ACETAMINOPHEN 500 MG PO TABS: 1000.0000 mg | ORAL_TABLET | Freq: Four times a day (QID) | ORAL | Status: DC | PRN

## 2022-09-06 NOTE — Progress Notes (Signed)
       CROSS COVER NOTE  NAME: Beth [name] MRN: [id number] DOB : 10-23-1945    Concern as stated by nurse / staff   Hello patient here for fall, left lateral malleolus acute fracture. Chronic o2 2L Hx CHF & COPD. BP checked 2 times, Manual BP 78/40. Low today. Denies any symptoms other than leg pain. Holding off on oxy last dose 1138. Can she have another dose of IV Toradol and any other recommendations? Thanks     Pertinent findings on chart review: Admitted nnon surgical left malleolus fracture after tripping over oxygen cord at home History reviewed.  Has had inadequate pain control per notes in setting of chronic pain syndrome. Management has been complicated by hypotensive episodes Significant transaminitis  chem panel 8/8, that appears to be new  Assessment and  Interventions   Assessment:    09/06/2022   11:00 PM 09/06/2022   10:49 PM 09/06/2022    5:45 PM  Vitals with BMI  Systolic 78 77 108  Diastolic 40 39 46  Pulse  59     Plan: Midodrine toradol Discontinue acetaminophen Repeat hepatic function - consider hepatic US if remains elevated/worsens      Donnie Mesa NP [facility] Cross Cover 7pm-7am - check amion for availability Pager [phone]

## 2022-09-06 NOTE — Progress Notes (Signed)
PROGRESS NOTE    [name]   ZOX:[id number] DOB: 08/07/1945  DOA: 09/05/2022 Date of Service: 09/06/22 PCP: Smitty Cords, DO     Brief Narrative / Hospital Course:  Beth [name] is a 77 year old female with history of depression, anxiety, neuropathy, insomnia, atrial fibrillation, on Eliquis, who presents to the emergency department for chief concerns of a fall in the morning 09/05/22 - Reports that she tripped over her oxygen cord and fell. She denies loss of consciousness. She denies head trauma  08/08: to ED. Left lateral malleolus acute fracture that is minimally displaced. Admitted for ortho consult and pain control, may need SNF/STR. Per ortho (Dr Joice Lofts) plan for nonsurgical treatment w/ splint 08/09: PT/OT recs pending.    Consultants:  Orthopedics  Procedures: none      ASSESSMENT & PLAN:   Principal Problem:   Inadequate pain control Active Problems:   HLD (hyperlipidemia)   HTN (hypertension)   Anxiety and depression   CVA, old, hemiparesis (HCC)   Fibromyalgia syndrome   Benign hypertension   Chronic recurrent major depressive disorder (HCC)   Major depressive disorder, recurrent, severe w/o psychotic behavior (HCC)   Chronic pain syndrome   Atherosclerosis of aorta (HCC)   Pulmonary hypertension (HCC)   Asthma with COPD   Closed fracture of left lateral malleolus   Atrial fibrillation, chronic (HCC)   Acute Left lateral malleolus acute fracture that is minimally displaced Mechanical Fall  Orthopedics following - conservative nonsurgical management PT/OT to see, may need SNF/STR  Pain control Home Gabapentin 900 mg tid Oxycodone 5 mg po q6h prn mod/severe Morphine 4 mg q4h prn breakthrough not controlled by po oxycodone    Atrial fibrillation, chronic (HCC) Patient's last dose of Eliquis was evening of 8/7 Eliquis 5 mg BID resumed on admission Metoprolol succinate 25 mg daily resumed  HLD (hyperlipidemia) Home  atorvastatin 40 mg nightly resumed   HTN (hypertension) Home furosemide 40 mg daily resumed for 8/9, metoprolol succinate 25 mg daily resumed for 8/8 Hydralazine 5 mg q8h prn for sbp > 170 mmHg, 4 days   Anxiety and depression Ativan 0.5 mg p.o. every 6 hours as needed for anxiety, 1 day ordered   COPD/Asthma Albuterol prn Breo + Incruse, Singulair   Pulmonary hypertension (HCC) Patient wears 3 L [place] continuously Continue O2 support   Chronic pain syndrome Home tramadol not resumed on admission, PDMP reviewed Pain control for ankle fracture - see above   Major depressive disorder, recurrent, severe w/o psychotic behavior (HCC) Home quetiapine 250 mg (confirmed filled with pharmacy) at bedtime resumed      DVT prophylaxis: eliquis Pertinent IV fluids/nutrition: no contiuous IV fluids  Central lines / invasive devices: none   Code Status: FULL CODE ACP documentation reviewed: 09/06/22 none on file   Current Admission Status: inpatient   TOC needs / Dispo plan: TBD likely will need HH/SNF Barriers to discharge / significant pending items: PT/OT pending              Subjective / Brief ROS:  Patient reports pain in hip and lower back Denies CP/SOB.  Denies new weakness.  Tolerating diet but low appetitie.  Reports no concerns w/ urination/defecation.   Family Communication: none at this time     Objective Findings:  Vitals:   09/05/22 2032 09/06/22 0041 09/06/22 0814 09/06/22 0939  BP: (!) 111/44 (!) 103/42 (!) 107/45 (!) 112/58  Pulse: 77 70 67   Resp: 16 14 15    Temp:  98.7 F (37.1 C) 98.3 F (36.8 C) 98.6 F (37 C)   TempSrc:   Oral   SpO2: 98% 98% 100%   Weight:      Height:        Intake/Output Summary (Last 24 hours) at 09/06/2022 1433 Last data filed at 09/06/2022 0944 Gross per 24 hour  Intake 360 ml  Output 0 ml  Net 360 ml   Filed Weights   09/05/22 1249  Weight: 60.3 kg    Examination:  Physical Exam Constitutional:       General: She is not in acute distress.    Appearance: She is not toxic-appearing.  Cardiovascular:     Rate and Rhythm: Normal rate and regular rhythm.  Pulmonary:     Effort: Pulmonary effort is normal.     Breath sounds: Normal breath sounds.  Musculoskeletal:     Right lower leg: No edema.     Left lower leg: No edema.  Skin:    General: Skin is warm and dry.     Findings: No rash.  Neurological:     General: No focal deficit present.     Mental Status: She is alert and oriented to person, place, and time.  Psychiatric:        Mood and Affect: Mood normal.        Behavior: Behavior normal.          Scheduled Medications:   apixaban  5 mg Oral BID   atorvastatin  40 mg Oral QHS   FLUoxetine  20 mg Oral Daily   fluticasone furoate-vilanterol  1 puff Inhalation Daily   And   umeclidinium bromide  1 puff Inhalation Daily   furosemide  40 mg Oral Daily   gabapentin  900 mg Oral TID   metoprolol succinate  25 mg Oral Daily   montelukast  10 mg Oral QHS   QUEtiapine  250 mg Oral QHS    Continuous Infusions:   PRN Medications:  acetaminophen **OR** acetaminophen, albuterol, hydrALAZINE, LORazepam, melatonin, ondansetron **OR** ondansetron (ZOFRAN) IV, oxyCODONE  Antimicrobials from admission:  Anti-infectives (From admission, onward)    None           Data Reviewed:  I have personally reviewed the following...  CBC: Recent Labs  Lab 08/30/22 1530 09/05/22 1502 09/06/22 0610  WBC 9.6 12.0* 10.1  NEUTROABS 5.3 9.8*  --   HGB 12.1 10.8* 9.4*  HCT 36.7 33.9* 28.6*  MCV 93.4 96.9 95.3  PLT 332 325 307   Basic Metabolic Panel: Recent Labs  Lab 08/30/22 1530 09/05/22 1502 09/06/22 0610  NA 138 139 139  K 3.6 4.6 3.7  CL 101 102 104  CO2 27 26 28   GLUCOSE 116* 109* 102*  BUN 25* 26* 19  CREATININE 0.86 0.97 0.89  CALCIUM 9.0 9.0 8.8*   GFR: Estimated Creatinine Clearance: 46.9 mL/min (by C-G formula based on SCr of 0.89 mg/dL). Liver  Function Tests: Recent Labs  Lab 09/05/22 1502  AST 360*  ALT 241*  ALKPHOS 297*  BILITOT 1.6*  PROT 6.6  ALBUMIN 3.1*   No results for input(s): "LIPASE", "AMYLASE" in the last 168 hours. No results for input(s): "AMMONIA" in the last 168 hours. Coagulation Profile: No results for input(s): "INR", "PROTIME" in the last 168 hours. Cardiac Enzymes: No results for input(s): "CKTOTAL", "CKMB", "CKMBINDEX", "TROPONINI" in the last 168 hours. BNP (last 3 results) No results for input(s): "PROBNP" in the last 8760 hours. HbA1C: No results for input(s): "  HGBA1C" in the last 72 hours. CBG: No results for input(s): "GLUCAP" in the last 168 hours. Lipid Profile: No results for input(s): "CHOL", "HDL", "LDLCALC", "TRIG", "CHOLHDL", "LDLDIRECT" in the last 72 hours. Thyroid Function Tests: No results for input(s): "TSH", "T4TOTAL", "FREET4", "T3FREE", "THYROIDAB" in the last 72 hours. Anemia Panel: No results for input(s): "VITAMINB12", "FOLATE", "FERRITIN", "TIBC", "IRON", "RETICCTPCT" in the last 72 hours. Most Recent Urinalysis On File:     Component Value Date/Time   COLORURINE STRAW (A) 08/25/2022 1740   APPEARANCEUR CLEAR (A) 08/25/2022 1740   APPEARANCEUR Hazy 12/25/2012 1507   LABSPEC 1.006 08/25/2022 1740   LABSPEC 1.015 12/25/2012 1507   PHURINE 5.0 08/25/2022 1740   GLUCOSEU NEGATIVE 08/25/2022 1740   GLUCOSEU Negative 12/25/2012 1507   HGBUR NEGATIVE 08/25/2022 1740   BILIRUBINUR NEGATIVE 08/25/2022 1740   BILIRUBINUR Negative 06/26/2022 1525   BILIRUBINUR Negative 12/25/2012 1507   KETONESUR 5 (A) 08/25/2022 1740   PROTEINUR NEGATIVE 08/25/2022 1740   UROBILINOGEN 0.2 06/26/2022 1525   NITRITE NEGATIVE 08/25/2022 1740   LEUKOCYTESUR NEGATIVE 08/25/2022 1740   LEUKOCYTESUR 3+ 12/25/2012 1507   Sepsis Labs: @LABRCNTIP (procalcitonin:4,lacticidven:4) Microbiology: No results found for this or any previous visit (from the past 240 hour(s)).    Radiology Studies  last 3 days: CT Lumbar Spine Wo Contrast  Result Date: 09/05/2022 CLINICAL DATA:  Back trauma.  Fall. EXAM: CT LUMBAR SPINE WITHOUT CONTRAST TECHNIQUE: Multidetector CT imaging of the lumbar spine was performed without intravenous contrast administration. Multiplanar CT image reconstructions were also generated. RADIATION DOSE REDUCTION: This exam was performed according to the departmental dose-optimization program which includes automated exposure control, adjustment of the mA and/or kV according to patient size and/or use of iterative reconstruction technique. COMPARISON:  Lumbar spine radiographs 08/12/2022. FINDINGS: Segmentation: Conventional numbering is assumed with 5 non-rib-bearing, lumbar type vertebral bodies. Alignment: Normal. Vertebrae: Unchanged chronic compression deformity of the T12 vertebral body with moderate anterior height loss. No new fracture. Multilevel degenerative endplate changes. Paraspinal and other soft tissues: Atherosclerotic calcifications of the abdominal aorta and its branches. Disc levels: Multilevel lumbar spondylosis, worst at L4-5, where there is at least moderate spinal canal stenosis. IMPRESSION: 1. No acute fracture or traumatic malalignment of the lumbar spine. 2. Unchanged chronic compression deformity of the T12 vertebral body with moderate anterior height loss. 3. Multilevel lumbar spondylosis, worst at L4-5, where there is at least moderate spinal canal stenosis. Aortic Atherosclerosis (ICD10-I70.0). Electronically Signed   By: Orvan Falconer M.D.   On: 09/05/2022 14:48   CT HEAD WO CONTRAST ( )  Result Date: 09/05/2022 CLINICAL DATA:  Head trauma, minor (Age >= 65y); Neck trauma (Age >= 65y). Fall. EXAM: CT HEAD WITHOUT CONTRAST CT CERVICAL SPINE WITHOUT CONTRAST TECHNIQUE: Multidetector CT imaging of the head and cervical spine was performed following the standard protocol without intravenous contrast. Multiplanar CT image reconstructions of the cervical  spine were also generated. RADIATION DOSE REDUCTION: This exam was performed according to the departmental dose-optimization program which includes automated exposure control, adjustment of the mA and/or kV according to patient size and/or use of iterative reconstruction technique. COMPARISON:  Head CT and brain MRI 03/24/2022. MRI cervical spine 03/24/2022. FINDINGS: CT HEAD FINDINGS Brain: Within limits of motion artifact, no acute hemorrhage. Unchanged mild chronic small-vessel disease with old lacunar infarct in the right lentiform nucleus. No hydrocephalus or extra-axial collection. No mass effect or midline shift. Vascular: No hyperdense vessel or unexpected calcification. Skull: No calvarial fracture or suspicious bone lesion. Skull base is  unremarkable. Sinuses/Orbits: No acute finding. Other: None. CT CERVICAL SPINE FINDINGS Alignment: Normal. Skull base and vertebrae: Prior C5-C7 ACDF with solid bony fusion across the intervening disc spaces. Hardware is intact. No associated lucency. No acute fracture. Craniocervical junction is intact. Soft tissues and spinal canal: No prevertebral fluid or swelling. No visible canal hematoma. Disc levels: Mild cervical spondylosis without high-grade spinal canal stenosis. Upper chest: Emphysema in the lung apices. Other: None. IMPRESSION: 1. No acute intracranial abnormality. 2. No acute cervical spine fracture or traumatic listhesis. 3. Prior C5-C7 ACDF with solid bony fusion across the intervening disc spaces. Electronically Signed   By: Orvan Falconer M.D.   On: 09/05/2022 14:32   CT Cervical Spine Wo Contrast  Result Date: 09/05/2022 CLINICAL DATA:  Head trauma, minor (Age >= 65y); Neck trauma (Age >= 65y). Fall. EXAM: CT HEAD WITHOUT CONTRAST CT CERVICAL SPINE WITHOUT CONTRAST TECHNIQUE: Multidetector CT imaging of the head and cervical spine was performed following the standard protocol without intravenous contrast. Multiplanar CT image reconstructions of the  cervical spine were also generated. RADIATION DOSE REDUCTION: This exam was performed according to the departmental dose-optimization program which includes automated exposure control, adjustment of the mA and/or kV according to patient size and/or use of iterative reconstruction technique. COMPARISON:  Head CT and brain MRI 03/24/2022. MRI cervical spine 03/24/2022. FINDINGS: CT HEAD FINDINGS Brain: Within limits of motion artifact, no acute hemorrhage. Unchanged mild chronic small-vessel disease with old lacunar infarct in the right lentiform nucleus. No hydrocephalus or extra-axial collection. No mass effect or midline shift. Vascular: No hyperdense vessel or unexpected calcification. Skull: No calvarial fracture or suspicious bone lesion. Skull base is unremarkable. Sinuses/Orbits: No acute finding. Other: None. CT CERVICAL SPINE FINDINGS Alignment: Normal. Skull base and vertebrae: Prior C5-C7 ACDF with solid bony fusion across the intervening disc spaces. Hardware is intact. No associated lucency. No acute fracture. Craniocervical junction is intact. Soft tissues and spinal canal: No prevertebral fluid or swelling. No visible canal hematoma. Disc levels: Mild cervical spondylosis without high-grade spinal canal stenosis. Upper chest: Emphysema in the lung apices. Other: None. IMPRESSION: 1. No acute intracranial abnormality. 2. No acute cervical spine fracture or traumatic listhesis. 3. Prior C5-C7 ACDF with solid bony fusion across the intervening disc spaces. Electronically Signed   By: Orvan Falconer M.D.   On: 09/05/2022 14:32   DG Tibia/Fibula Left  Result Date: 09/05/2022 CLINICAL DATA:  pain injury EXAM: LEFT TIBIA AND FIBULA - 2 VIEW COMPARISON:  Ankle radiograph from earlier the same day. FINDINGS: There is acute minimally displaced oblique fracture of the later malleolus. Acute fracture of the medial malleolus was better seen on the prior ankle radiograph. No other acute fracture or dislocation.  No aggressive osseous lesion. The knee joint appears within normal limits. No significant arthritis. Ankle mortise appears intact. No focal soft tissue swelling. No radiopaque foreign bodies. IMPRESSION: Acute minimally displaced fracture of the lateral malleolus. Electronically Signed   By: Jules Schick M.D.   On: 09/05/2022 14:18   DG Hip Unilat W or Wo Pelvis 2-3 Views Left  Result Date: 09/05/2022 CLINICAL DATA:  pain injury EXAM: DG HIP (WITH OR WITHOUT PELVIS) 2-3V LEFT COMPARISON:  None Available. FINDINGS: Pelvis is intact with normal and symmetric sacroiliac joints. No acute fracture or dislocation. No aggressive osseous lesion. Visualized sacral arcuate lines are unremarkable. Unremarkable symphysis pubis. There are mild degenerative changes of bilateral hip joints without significant joint space narrowing. Osteophytosis of the superior acetabulum. No radiopaque  foreign bodies. IMPRESSION: *No acute fracture or dislocation. Mild degenerative changes of the hip joints. Electronically Signed   By: Jules Schick M.D.   On: 09/05/2022 14:14   DG Ankle Complete Left  Result Date: 09/05/2022 CLINICAL DATA:  fall EXAM: LEFT ANKLE COMPLETE - 3+ VIEW COMPARISON:  None Available. FINDINGS: Acute minimally displaced transsyndesmotic distal fibular fracture. Acute displaced medial malleolar avulsion fracture. Slight cortical irregularity of the posterior malleolus suggestive of a nondisplaced fracture. There is no evidence of severe arthropathy or other focal bone abnormality. Lateral greater than medial subcutaneus soft tissue edema. IMPRESSION: 1. Acute minimally displaced transsyndesmotic distal fibular fracture. 2. Acute displaced medial malleolar avulsion fracture. 3. Slight cortical irregularity of the posterior malleolus suggestive of a nondisplaced fracture. Electronically Signed   By: Tish Frederickson M.D.   On: 09/05/2022 13:48             LOS: 1 day      Sunnie Nielsen, DO [facility] 09/06/2022, 2:33 PM    Dictation software may have been used to generate the above note. Typos may occur and escape review in typed/dictated notes. Please contact Dr Lyn Hollingshead directly for clarity if needed.  Staff may message me via secure chat in Epic  but this may not receive an immediate response,  please page me for urgent matters!  If 7PM-7AM, please contact night coverage [email or web address]

## 2022-09-06 NOTE — Progress Notes (Signed)
2315  BP checked 2 times, Manual BP 78/40. Denies any symptoms other than leg pain. Holding off on oxy last dose 1138. NP notified. Discontinued Tylenol. Elevated liver enzymes. One dose Toradol . Ordered midodrine. Will give & recheck BP 1 hour.

## 2022-09-06 NOTE — Progress Notes (Incomplete)
       CROSS COVER NOTE  NAME: Beth [name] MRN: [id number] DOB : September 22, 1945    Concern as stated by nurse / staff   Hello patient here for fall, left lateral malleolus acute fracture. Chronic o2 2L Hx CHF & COPD. BP checked 2 times, Manual BP 78/40. Low today. Denies any symptoms other than leg pain. Holding off on oxy last dose 1138. Can she have another dose of IV Toradol and any other recommendations? Thanks     Pertinent findings on chart review: Admitted nnon surgical left malleolus fracture after tripping over oxygen cord at home History reviewed.  Has had inadequate pain control per notes in setting of chronic pain syndrome. Management has been complicated by hypotensive episodes   Assessment and  Interventions   Assessment:    09/06/2022   11:00 PM 09/06/2022   10:49 PM 09/06/2022    5:45 PM  Vitals with BMI  Systolic 78 77 108  Diastolic 40 39 46  Pulse  59     Plan: X X X

## 2022-09-06 NOTE — Progress Notes (Signed)
1745 Pt having low b/p rechecked by nurse manual b/p 108/46 Dr Lyn Hollingshead made aware Verbal order with readback to hold PO oxycodone. Gabapentin PO will be reduced to 600mg  and she will order IV toradol.

## 2022-09-06 NOTE — Progress Notes (Signed)
Patient ID: Beth [name], female   DOB: November 19, 1945, 77 y.o.   MRN: [id number]  Subjective: The patient continues to note moderate pain in her ankle and lower leg.  She denies any reinjury to the foot or ankle and denies any numbness or paresthesias to her toes.   Objective: Vital signs in last 24 hours: Temp:  [98.3 F (36.8 C)-99.6 F (37.6 C)] 98.3 F (36.8 C) (08/09 0041) Pulse Rate:  [70-88] 70 (08/09 0041) Resp:  [14-18] 14 (08/09 0041) BP: (103-133)/(42-65) 103/42 (08/09 0041) SpO2:  [98 %-100 %] 98 % (08/09 0041) Weight:  [60.3 kg] 60.3 kg (08/08 1249)  Intake/Output from previous day: No intake/output data recorded. Intake/Output this shift: No intake/output data recorded.  Recent Labs    09/05/22 1502 09/06/22 0610  HGB 10.8* 9.4*   Recent Labs    09/05/22 1502 09/06/22 0610  WBC 12.0* 10.1  RBC 3.50* 3.00*  HCT 33.9* 28.6*  PLT 325 307   Recent Labs    09/05/22 1502 09/06/22 0610  NA 139 139  K 4.6 3.7  CL 102 104  CO2 26 28  BUN 26* 19  CREATININE 0.97 0.89  GLUCOSE 109* 102*  CALCIUM 9.0 8.8*   No results for input(s): "LABPT", "INR" in the last 72 hours.  Physical Exam: Orthopedic examination again is limited to the left foot and lower leg.  The new posterior splint is in place and appears to be in excellent condition.  The ankle is now at near neutral dorsiflexion.  The skin is intact at the proximal and distal margins of the splint.  She is able to dorsiflex and plantarflex her toes, and has intact sensation to light touch to all toes.  She exhibits excellent capillary refill to all digits.  Assessment: Status post essentially nondisplaced fracture left distal fibula.  Plan: The treatment options have been reviewed with the patient.  She will be mobilized with physical therapy today nonweightbearing of the left foot and using a walker for balance and support.  She may continue to receive medication for pain as deemed appropriate medically.   She is to keep her foot elevated in the bed is much as possible.  Thank you for asking me to participate in the care of this most pleasant young unfortunate woman.  I will sign off at this time.  Please make arrangements for the patient to follow-up in our office in the next 7 to 10 days for repeat x-rays of her ankle.  If you have further need of orthopedic input during this hospitalization, please reconsult me.   Excell Seltzer  09/06/2022, 8:05 AM

## 2022-09-06 NOTE — Evaluation (Signed)
Physical Therapy Evaluation Patient Details Name: Beth [name] MRN: [id number] DOB: 1945/10/26 Today's Date: 09/06/2022  History of Present Illness  Pt. is a 77 year old female with history of COPD on 3L of O2 at night, HTN, depression, anxiety, neuropathy, insomnia, atrial fibrillation, on Eliquis, who presents to the emergency department for chief concerns of a fall in the morning.  Clinical Impression  Pt is received in bed, she is agreeable for PT session. Pt performs bed mobility with mod-max A requiring cuing for hand placement and facilitation to reduce friction of LLE when sitting EOB. Pt demonstrates decrease initiation and requiring frequent cuing. Vitals were monitored in middle of session and maintained WNL with 3L O2 [place]. Pt would benefit from skilled PT to address above deficits and promote optimal return to PLOF.         If plan is discharge home, recommend the following: A lot of help with walking and/or transfers;A lot of help with bathing/dressing/bathroom;Assist for transportation;Help with stairs or ramp for entrance;Assistance with cooking/housework   Can travel by private vehicle   No    Equipment Recommendations Wheelchair (measurements PT)  Recommendations for Other Services       Functional Status Assessment Patient has had a recent decline in their functional status and demonstrates the ability to make significant improvements in function in a reasonable and predictable amount of time.     Precautions / Restrictions Precautions Precautions: Fall Required Braces or Orthoses: Splint/Cast Splint/Cast: LLE splint/cast Splint/Cast - Date Prophylactic Dressing Applied (if applicable): 09/05/22 Restrictions Weight Bearing Restrictions: Yes LLE Weight Bearing: Non weight bearing      Mobility  Bed Mobility Overal bed mobility: Needs Assistance Bed Mobility: Supine to Sit     Supine to sit: Mod assist, Max assist     General bed mobility comments:  mod-max A x1 and delayed initiation requiring occasional facilitation of LLE toward EOB    Transfers                   General transfer comment: NT due to fatigue    Ambulation/Gait               General Gait Details: NT due to fatigue and NWB precautions  Stairs            Wheelchair Mobility     Tilt Bed    Modified Rankin (Stroke Patients Only)       Balance Overall balance assessment: Needs assistance Sitting-balance support: Bilateral upper extremity supported, Feet supported Sitting balance-Leahy Scale: Fair Sitting balance - Comments: required BUE support on bed/railing with occasional cues to keep eyes open       Standing balance comment: NT due to fatigue                             Pertinent Vitals/Pain Pain Assessment Pain Assessment: Faces Faces Pain Scale: Hurts a little bit Pain Location: B hips and L ankle Pain Descriptors / Indicators: Sore, Aching Pain Intervention(s): Monitored during session, Repositioned    Home Living Family/patient expects to be discharged to:: Other (Comment) ([facility]) Living Arrangements: Alone (Has 1 dog) Available Help at Discharge: Family;Available PRN/intermittently;Friend(s) Type of Home: Apartment Home Access: Level entry       Home Layout: One level Home Equipment: Grab bars - tub/shower;Shower seat - built [employer] (2 wheels) Additional Comments: Tub/shower has a recently been lowered to make it easier to enter  Prior Function Prior Level of Function : Independent/Modified Independent;History of Falls (last six months)             Mobility Comments: Mod Ind amb community distances with a rollator, 4 falls in the last 6 months from tripping/LOB ADLs Comments: Ind with ADLs, cares for her small dog     Extremity/Trunk Assessment   Upper Extremity Assessment Upper Extremity Assessment: Overall WFL for tasks assessed     Lower Extremity Assessment Lower Extremity Assessment: Generalized weakness;LLE deficits/detail LLE Deficits / Details: Unable to formally assess due to splint. Able to perform 3x SLR but became fatigued by last rep LLE: Unable to fully assess due to immobilization LLE Sensation: WNL LLE Coordination: WNL       Communication   Communication Communication: No apparent difficulties  Cognition Arousal: Alert Behavior During Therapy: WFL for tasks assessed/performed Overall Cognitive Status: Within Functional Limits for tasks assessed                                 General Comments: Pleasant but became tired by end of session        General Comments      Exercises     Assessment/Plan    PT Assessment Patient needs continued PT services  PT Problem List Decreased strength;Decreased activity tolerance;Decreased balance;Decreased mobility;Decreased knowledge of use of DME;Pain;Decreased safety awareness       PT Treatment Interventions DME instruction;Gait training;Functional mobility training;Therapeutic activities;Therapeutic exercise;Balance training;Patient/family education    PT Goals (Current goals can be found in the Care Plan section)  Acute Rehab PT Goals Patient Stated Goal: to go home PT Goal Formulation: With patient Time For Goal Achievement: 09/20/22 Potential to Achieve Goals: Fair    Frequency Min 1X/week     Co-evaluation               AM-PAC PT "6 Clicks" Mobility  Outcome Measure Help needed turning from your back to your side while in a flat bed without using bedrails?: A Lot Help needed moving from lying on your back to sitting on the side of a flat bed without using bedrails?: A Little Help needed moving to and from a bed to a chair (including a wheelchair)?: A Lot Help needed standing up from a chair using your arms (e.g., wheelchair or bedside chair)?: A Lot Help needed to walk in hospital room?: Total Help needed  climbing 3-5 steps with a railing? : Total 6 Click Score: 11    End of Session   Activity Tolerance: Patient tolerated treatment well;Patient limited by fatigue;Patient limited by pain Patient left: in bed;with call bell/phone within reach;with bed alarm set;with family/visitor present Nurse Communication: Mobility status PT Visit Diagnosis: Unsteadiness on feet (R26.81);History of falling (Z91.81);Difficulty in walking, not elsewhere classified (R26.2);Muscle weakness (generalized) (M62.81);Pain Pain - Right/Left:  (B hip and L ankle) Pain - part of body: Hip;Ankle and joints of foot (B hip and L ankle/foot)    Time: 4742-5956 PT Time Calculation (min) (ACUTE ONLY): 17 min   Charges:                 Elmon Else, SPT     09/06/2022, 3:14 PM

## 2022-09-06 NOTE — Plan of Care (Signed)

## 2022-09-06 NOTE — TOC Progression Note (Signed)
Transition of Care [facility]) - Progression Note    Patient Details  Name: Beth [name] MRN: [id number] Date of Birth: 23-Jan-1946  [facility] CM/SW Contact  Marlowe Sax, RN Phone Number: 09/06/2022, 3:58 PM  Clinical Narrative:     Met with the patient and her son in the room, she stated that she has O2 at home and does not need DME, she has a RW and 3 in1, she was set up with [facility] at last visit one week ago She stated that she does not want to go to STR because she has friends that went and did not do well Some even died I explained that going home alone may not be the best bet and that peopke going to rehab and dying is not common She stated that she wanted to work with PT again before deciding anything TOC to follow and assist with DC planning    Barriers to Discharge: Continued Medical Work up  Expected Discharge Plan and Services   Discharge Planning Services: CM Consult   Living arrangements for the past 2 months: Apartment                   DME Agency: NA                   Social Determinants of Health (SDOH) Interventions SDOH Screenings   Food Insecurity: No Food Insecurity (09/05/2022)  Housing: Low Risk  (09/05/2022)  Transportation Needs: No Transportation Needs (09/05/2022)  Utilities: Not At Risk (09/05/2022)  Alcohol Screen: Low Risk  (02/01/2022)  Depression (PHQ2-9): High Risk (05/13/2022)  Financial Resource Strain: Low Risk  (02/01/2022)  Physical Activity: Insufficiently Active (02/01/2022)  Social Connections: Socially Isolated (02/01/2022)  Stress: No Stress Concern Present (02/01/2022)  Tobacco Use: High Risk (09/05/2022)    Readmission Risk Interventions     No data to display

## 2022-09-07 DIAGNOSIS — R52 Pain, unspecified: Secondary | ICD-10-CM | POA: Diagnosis not present

## 2022-09-07 LAB — HEPATIC FUNCTION PANEL
ALT: 180 U/L — ABNORMAL HIGH (ref 0–44)
AST: 201 U/L — ABNORMAL HIGH (ref 15–41)
Albumin: 2.8 g/dL — ABNORMAL LOW (ref 3.5–5.0)
Alkaline Phosphatase: 243 U/L — ABNORMAL HIGH (ref 38–126)
Bilirubin, Direct: 0.2 mg/dL (ref 0.0–0.2)
Indirect Bilirubin: 1.1 mg/dL — ABNORMAL HIGH (ref 0.3–0.9)
Total Bilirubin: 1.3 mg/dL — ABNORMAL HIGH (ref 0.3–1.2)
Total Protein: 6.2 g/dL — ABNORMAL LOW (ref 6.5–8.1)

## 2022-09-07 LAB — CBC WITH DIFFERENTIAL/PLATELET
Abs Immature Granulocytes: 0.04 10*3/uL (ref 0.00–0.07)
Basophils Absolute: 0 10*3/uL (ref 0.0–0.1)
Basophils Relative: 0 %
Eosinophils Absolute: 0.5 10*3/uL (ref 0.0–0.5)
Eosinophils Relative: 5 %
HCT: 26.9 % — ABNORMAL LOW (ref 36.0–46.0)
Hemoglobin: 8.8 g/dL — ABNORMAL LOW (ref 12.0–15.0)
Immature Granulocytes: 0 %
Lymphocytes Relative: 30 %
Lymphs Abs: 2.7 10*3/uL (ref 0.7–4.0)
MCH: 31.4 pg (ref 26.0–34.0)
MCHC: 32.7 g/dL (ref 30.0–36.0)
MCV: 96.1 fL (ref 80.0–100.0)
Monocytes Absolute: 1 10*3/uL (ref 0.1–1.0)
Monocytes Relative: 11 %
Neutro Abs: 4.8 10*3/uL (ref 1.7–7.7)
Neutrophils Relative %: 54 %
Platelets: 289 10*3/uL (ref 150–400)
RBC: 2.8 MIL/uL — ABNORMAL LOW (ref 3.87–5.11)
RDW: 14.4 % (ref 11.5–15.5)
WBC: 8.9 10*3/uL (ref 4.0–10.5)
nRBC: 0 % (ref 0.0–0.2)

## 2022-09-07 LAB — COMPREHENSIVE METABOLIC PANEL
ALT: 118 U/L — ABNORMAL HIGH (ref 0–44)
AST: 93 U/L — ABNORMAL HIGH (ref 15–41)
Albumin: 2.3 g/dL — ABNORMAL LOW (ref 3.5–5.0)
Alkaline Phosphatase: 168 U/L — ABNORMAL HIGH (ref 38–126)
Anion gap: 6 (ref 5–15)
BUN: 26 mg/dL — ABNORMAL HIGH (ref 8–23)
CO2: 29 mmol/L (ref 22–32)
Calcium: 8.7 mg/dL — ABNORMAL LOW (ref 8.9–10.3)
Chloride: 105 mmol/L (ref 98–111)
Creatinine, Ser: 1.19 mg/dL — ABNORMAL HIGH (ref 0.44–1.00)
GFR, Estimated: 47 mL/min — ABNORMAL LOW (ref >60)
Glucose, Bld: 100 mg/dL — ABNORMAL HIGH (ref 70–99)
Potassium: 3.2 mmol/L — ABNORMAL LOW (ref 3.5–5.1)
Sodium: 140 mmol/L (ref 135–145)
Total Bilirubin: 0.6 mg/dL (ref 0.3–1.2)
Total Protein: 5.6 g/dL — ABNORMAL LOW (ref 6.5–8.1)

## 2022-09-07 LAB — MAGNESIUM: Magnesium: 1.9 mg/dL (ref 1.7–2.4)

## 2022-09-07 LAB — HEMOGLOBIN AND HEMATOCRIT, BLOOD
HCT: 28.6 % — ABNORMAL LOW (ref 36.0–46.0)
Hemoglobin: 9.3 g/dL — ABNORMAL LOW (ref 12.0–15.0)

## 2022-09-07 LAB — GLUCOSE, CAPILLARY: Glucose-Capillary: 101 mg/dL — ABNORMAL HIGH (ref 70–99)

## 2022-09-07 LAB — MRSA NEXT GEN BY PCR, NASAL: MRSA by PCR Next Gen: NOT DETECTED

## 2022-09-07 LAB — LACTIC ACID, PLASMA: Lactic Acid, Venous: 1.3 mmol/L (ref 0.5–1.9)

## 2022-09-07 LAB — PROCALCITONIN: Procalcitonin: 0.23 ng/mL

## 2022-09-07 LAB — TROPONIN I (HIGH SENSITIVITY): Troponin I (High Sensitivity): 9 ng/L (ref <18)

## 2022-09-07 MED ORDER — SODIUM CHLORIDE 0.9 % IV SOLN
1.0000 g | INTRAVENOUS | Status: DC
  Administered 2022-09-07 – 2022-09-09 (×3): 1 g via INTRAVENOUS
  Filled 2022-09-07 (×4): qty 10

## 2022-09-07 MED ORDER — FENTANYL CITRATE PF 50 MCG/ML IJ SOSY: 25.0000 ug | PREFILLED_SYRINGE | INTRAMUSCULAR | Status: DC | PRN

## 2022-09-07 MED ORDER — MIDODRINE HCL 5 MG PO TABS: 10.0000 mg | ORAL_TABLET | Freq: Three times a day (TID) | ORAL | Status: DC

## 2022-09-07 MED ORDER — CHLORHEXIDINE GLUCONATE CLOTH 2 % EX PADS
6.0000 | MEDICATED_PAD | Freq: Every day | CUTANEOUS | Status: DC
  Administered 2022-09-08 – 2022-09-12 (×5): 6 via TOPICAL

## 2022-09-07 MED ORDER — LACTATED RINGERS IV SOLN: INTRAVENOUS | Status: DC

## 2022-09-07 MED ORDER — TRAMADOL HCL 50 MG PO TABS
50.0000 mg | ORAL_TABLET | Freq: Once | ORAL | Status: AC
  Administered 2022-09-07: 50 mg via ORAL
  Filled 2022-09-07: qty 1

## 2022-09-07 MED ORDER — OXYCODONE HCL 5 MG PO TABS
2.5000 mg | ORAL_TABLET | Freq: Four times a day (QID) | ORAL | Status: AC | PRN
  Administered 2022-09-07: 2.5 mg via ORAL
  Filled 2022-09-07: qty 1

## 2022-09-07 MED ORDER — MELATONIN 5 MG PO TABS: 5.0000 mg | ORAL_TABLET | Freq: Every evening | ORAL | Status: DC | PRN

## 2022-09-07 MED ORDER — GABAPENTIN 100 MG PO CAPS
100.0000 mg | ORAL_CAPSULE | Freq: Three times a day (TID) | ORAL | Status: DC
  Administered 2022-09-07 – 2022-09-13 (×16): 100 mg via ORAL
  Filled 2022-09-07 (×17): qty 1

## 2022-09-07 MED ORDER — GABAPENTIN 300 MG PO CAPS
300.0000 mg | ORAL_CAPSULE | Freq: Three times a day (TID) | ORAL | Status: DC
  Administered 2022-09-07: 300 mg via ORAL
  Filled 2022-09-07: qty 1

## 2022-09-07 MED ORDER — NOREPINEPHRINE 4 MG/250ML-% IV SOLN
2.0000 ug/min | INTRAVENOUS | Status: DC
  Administered 2022-09-07: 2 ug/min via INTRAVENOUS
  Filled 2022-09-07: qty 250

## 2022-09-07 MED ORDER — NAPROXEN 500 MG PO TABS
500.0000 mg | ORAL_TABLET | Freq: Two times a day (BID) | ORAL | Status: DC
  Administered 2022-09-07 – 2022-09-09 (×6): 500 mg via ORAL
  Filled 2022-09-07 (×6): qty 1

## 2022-09-07 MED ORDER — MIDODRINE HCL 5 MG PO TABS
10.0000 mg | ORAL_TABLET | Freq: Three times a day (TID) | ORAL | Status: DC
  Administered 2022-09-07 – 2022-09-10 (×11): 10 mg via ORAL
  Filled 2022-09-07 (×11): qty 2

## 2022-09-07 MED ORDER — LACTATED RINGERS IV BOLUS
500.0000 mL | Freq: Once | INTRAVENOUS | Status: AC
  Administered 2022-09-07: 500 mL via INTRAVENOUS

## 2022-09-07 MED ORDER — SODIUM CHLORIDE 0.9 % IV SOLN
250.0000 mL | INTRAVENOUS | Status: DC
  Administered 2022-09-07: 250 mL via INTRAVENOUS

## 2022-09-07 MED ORDER — SODIUM CHLORIDE 0.9 % IV BOLUS
1000.0000 mL | Freq: Once | INTRAVENOUS | Status: AC
  Administered 2022-09-07: 1000 mL via INTRAVENOUS

## 2022-09-07 NOTE — Consult Note (Signed)
PCCM Note  I have seen and examined the patient.   Reason for consult: hypotension  77 year old female with significant psychiatric history, neuropathy, insomnia, Afib on DOAC, and COPD who presents to the ER with an ankle fracture after a fall. She was found to have a left lateral malleolus fracture which was not operated on. Today, she is very sleepy and unable to participate in PT/OT. Transferred to stepdown for persistent hypotension in setting of AKI and some fluid boluses. Of note, patient does have a history of COPD and PH (likely group 3 disease).   She is mentating appropriately.   #AKI - continue fluid boluses - follow along with BMPs - avoid nephrotoxins  #Hypotension - suspect that some of this is somnolence - can start a short trial of fixed dose of levophed - her diastolic is low which artificially lowers the MAP - she is menta ting appropriately - does not appear septic or toxic (lactate reassuring) - fluid resuscitation - reducing delirio genic medications; gabapentin dropped to 100mg  tid - levophed to keep SBP >90 - continue midodrine  We will follow along closely with you.   Rhea Bleacher MD Pulmonary & Critical Care Medicine

## 2022-09-07 NOTE — Progress Notes (Signed)
OT Cancellation Note  Patient Details Name: Beth [name] MRN: [id number] DOB: Dec 05, 1945   Cancelled Treatment:    Reason Eval/Treat Not Completed: Patient not medically ready; BP still low at 93/44.  Pt lethargic.  Pt with high pain levels d/t no pain meds able to be given d/t low BP.  RN planning to administer meds to raise BP.  RN in agreement for OT to check back this afternoon or tomorrow to complete Eval.    Danelle Earthly, MS, OTR/L   Otis Dials 09/07/2022, 12:16 PM

## 2022-09-07 NOTE — Progress Notes (Addendum)
PROGRESS NOTE    Beth [name]   ZOX:[id number] DOB: [birth year]  DOA: 09/05/2022 Date of Service: 09/07/22 PCP: Beth Cords, DO     Brief Narrative / Hospital Course:  [name] is a 77 year old female with history of depression, anxiety, neuropathy, insomnia, atrial fibrillation, on Eliquis, who presents to the emergency department for chief concerns of a fall in the morning 09/05/22 - Reports that she tripped over her oxygen cord and fell. She denies loss of consciousness. She denies head trauma  08/08: to ED. Left lateral malleolus acute fracture that is minimally displaced. Admitted for ortho consult and pain control, may need SNF/STR. Per ortho (Dr Joice Lofts) plan for nonsurgical treatment w/ splint 08/09: PT/OT recs for SNF. Hypotensive, having to hold opiate pain medications, stopped beta blocker and lasix, and reduced gabapentin, melatonin  08/10: Concern for UTI w/ dysuria and abn UA, starting Rocephin. Cr up slightly, BP lower, scheduled midodrine, 1L NS bolus and BP improved only slightly. MAP <60. Hgb trending down but no s/s bleeding, checked again and trending better. No s/s infection other than UTI and not septic. Bradycardic. Holding opiates, reduced or d/c other potentially sedating meds. Given borderline BP not responding to fluids/midodrine and no clear reason for hypotension other than clinically a bit dry and of course polypharmayc, spoke w/ ICU and will transfer to stepdown for closer monitoring / in case needs pressors   Consultants:  Orthopedics  Procedures: none      ASSESSMENT & PLAN:   Principal Problem:   Inadequate pain control Active Problems:   HLD (hyperlipidemia)   HTN (hypertension)   Anxiety and depression   CVA, old, hemiparesis (HCC)   Fibromyalgia syndrome   Benign hypertension   Chronic recurrent major depressive disorder (HCC)   Major depressive disorder, recurrent, severe w/o psychotic behavior (HCC)   Chronic pain  syndrome   Atherosclerosis of aorta (HCC)   Pulmonary hypertension (HCC)   Asthma with COPD   Closed fracture of left lateral malleolus   Atrial fibrillation, chronic (HCC)   Acute Left lateral malleolus acute fracture that is minimally displaced Mechanical Fall  Orthopedics following - conservative nonsurgical management Pain control Home Gabapentin 900 mg tid --> 600 tid --> 300 tid Oxycodone 5 mg po q6h prn mod/severe --> 2.5 mg q6h and hold for SBP <110 Naproxen 500 mg bid ac Morphine d/c   Essential HTN (hypertension) but hypotensive here Home furosemide 40 mg daily resumed for 8/9, metoprolol succinate 25 mg daily resumed for 8/8 --> held d/t low BP MIdodrine tid started today, got one dose last night Fluid bolus then maintenance  UTI Rocephin  Await UCx Not septic   AKI Likely poor po intake +/- UTI Fluids as above check another BMP in AM  Atrial fibrillation, chronic (HCC) Patient's last dose of Eliquis was evening of 8/7 Eliquis 5 mg BID held pending serial HH and possibly hemoccult  Metoprolol succinate 25 mg daily held dt/ hypotension   HLD (hyperlipidemia) Home atorvastatin 40 mg nightly held d/t liver enzymes    COPD/Asthma Albuterol prn Breo + Incruse, Singulair   Pulmonary hypertension (HCC) Patient wears 3 L [place] continuously Continue O2 support  Home furosemide held d/t low BP  Chronic pain syndrome Home tramadol not resumed on admission, PDMP reviewed Pain control for ankle fracture - see above   Major depressive disorder, recurrent, severe w/o psychotic behavior (HCC) Anxiety and depression Home quetiapine 250 mg (confirmed filled with pharmacy) at bedtime held home fluoxetine  20 mg dialy resumed      DVT prophylaxis: eliquis Pertinent IV fluids/nutrition: no contiuous IV fluids  Central lines / invasive devices: none   Code Status: FULL CODE ACP documentation reviewed: 09/06/22 none on file   Current Admission Status: inpatient    TOC needs / Dispo plan: SNF STR Barriers to discharge / significant pending items: placement              Subjective / Brief ROS:  Patient reports pain poorly controlled Denies CP/SOB.  Feels a bit dizzy w/ sitting up, generally weak No pain other than ankle No rash, no cough, no dysuria  Denies bleeding, no nausea/vomiting, no headache  Denies new weakness.  Tolerating diet but low appetitie. Reports eating fairly well, RN reports she is eating about 25-50% meals  Reports no concerns w/ urination/defecation.   Family Communication: none at this time     Objective Findings:  Vitals:   09/07/22 0031 09/07/22 1129 09/07/22 1501 09/07/22 1505  BP: (!) 88/42 (!) 86/41 (!) 90/45 (!) 92/40  Pulse: (!) 56 (!) 56 (!) 57   Resp:   15   Temp:   98.1 F (36.7 C)   TempSrc:   Oral   SpO2:   100%   Weight:      Height:        Intake/Output Summary (Last 24 hours) at 09/07/2022 1628 Last data filed at 09/07/2022 1111 Gross per 24 hour  Intake 120 ml  Output --  Net 120 ml   Filed Weights   09/05/22 1249  Weight: 60.3 kg    Examination:  Physical Exam Constitutional:      General: She is not in acute distress.    Appearance: She is not toxic-appearing.  Cardiovascular:     Rate and Rhythm: Normal rate and regular rhythm.     Heart sounds: Murmur heard.  Pulmonary:     Effort: Pulmonary effort is normal.     Breath sounds: Normal breath sounds.  Musculoskeletal:     Right lower leg: No edema.     Left lower leg: No edema.  Skin:    General: Skin is warm and dry.     Findings: No rash.  Neurological:     General: No focal deficit present.     Mental Status: She is alert and oriented to person, place, and time.  Psychiatric:        Mood and Affect: Mood normal.        Behavior: Behavior normal.          Scheduled Medications:   FLUoxetine  20 mg Oral Daily   fluticasone furoate-vilanterol  1 puff Inhalation Daily   And   umeclidinium bromide  1  puff Inhalation Daily   gabapentin  300 mg Oral TID   midodrine  10 mg Oral TID WC   montelukast  10 mg Oral QHS   naproxen  500 mg Oral BID WC    Continuous Infusions:  cefTRIAXone (ROCEPHIN)  IV     lactated ringers      PRN Medications:  albuterol, ondansetron **OR** ondansetron (ZOFRAN) IV, oxyCODONE  Antimicrobials from admission:  Anti-infectives (From admission, onward)    Start     Dose/Rate Route Frequency Ordered Stop   09/07/22 1600  cefTRIAXone (ROCEPHIN) 1 g in sodium chloride 0.9 % 100 mL IVPB        1 g 200 mL/hr over 30 Minutes Intravenous Every 24 hours 09/07/22 1520  Data Reviewed:  I have personally reviewed the following...  CBC: Recent Labs  Lab 09/05/22 1502 09/06/22 0610 09/07/22 0449 09/07/22 1349  WBC 12.0* 10.1 8.9  --   NEUTROABS 9.8*  --  4.8  --   HGB 10.8* 9.4* 8.8* 9.3*  HCT 33.9* 28.6* 26.9* 28.6*  MCV 96.9 95.3 96.1  --   PLT 325 307 289  --    Basic Metabolic Panel: Recent Labs  Lab 09/05/22 1502 09/06/22 0610 09/07/22 0449  NA 139 139 140  K 4.6 3.7 3.2*  CL 102 104 105  CO2 26 28 29   GLUCOSE 109* 102* 100*  BUN 26* 19 26*  CREATININE 0.97 0.89 1.19*  CALCIUM 9.0 8.8* 8.7*  MG  --   --  1.9   GFR: Estimated Creatinine Clearance: 35.1 mL/min (A) (by C-G formula based on SCr of 1.19 mg/dL (H)). Liver Function Tests: Recent Labs  Lab 09/05/22 1502 09/06/22 0610 09/07/22 0449  AST 360* 201* 93*  ALT 241* 180* 118*  ALKPHOS 297* 243* 168*  BILITOT 1.6* 1.3* 0.6  PROT 6.6 6.2* 5.6*  ALBUMIN 3.1* 2.8* 2.3*   No results for input(s): "LIPASE", "AMYLASE" in the last 168 hours. No results for input(s): "AMMONIA" in the last 168 hours. Coagulation Profile: No results for input(s): "INR", "PROTIME" in the last 168 hours. Cardiac Enzymes: No results for input(s): "CKTOTAL", "CKMB", "CKMBINDEX", "TROPONINI" in the last 168 hours. BNP (last 3 results) No results for input(s): "PROBNP" in the last 8760  hours. HbA1C: No results for input(s): "HGBA1C" in the last 72 hours. CBG: No results for input(s): "GLUCAP" in the last 168 hours. Lipid Profile: No results for input(s): "CHOL", "HDL", "LDLCALC", "TRIG", "CHOLHDL", "LDLDIRECT" in the last 72 hours. Thyroid Function Tests: No results for input(s): "TSH", "T4TOTAL", "FREET4", "T3FREE", "THYROIDAB" in the last 72 hours. Anemia Panel: No results for input(s): "VITAMINB12", "FOLATE", "FERRITIN", "TIBC", "IRON", "RETICCTPCT" in the last 72 hours. Most Recent Urinalysis On File:     Component Value Date/Time   COLORURINE YELLOW (A) 09/06/2022 1924   APPEARANCEUR HAZY (A) 09/06/2022 1924   APPEARANCEUR Hazy 12/25/2012 1507   LABSPEC 1.015 09/06/2022 1924   LABSPEC 1.015 12/25/2012 1507   PHURINE 5.0 09/06/2022 1924   GLUCOSEU NEGATIVE 09/06/2022 1924   GLUCOSEU Negative 12/25/2012 1507   HGBUR NEGATIVE 09/06/2022 1924   BILIRUBINUR NEGATIVE 09/06/2022 1924   BILIRUBINUR Negative 06/26/2022 1525   BILIRUBINUR Negative 12/25/2012 1507   KETONESUR NEGATIVE 09/06/2022 1924   PROTEINUR NEGATIVE 09/06/2022 1924   UROBILINOGEN 0.2 06/26/2022 1525   NITRITE NEGATIVE 09/06/2022 1924   LEUKOCYTESUR LARGE (A) 09/06/2022 1924   LEUKOCYTESUR 3+ 12/25/2012 1507   Sepsis Labs: @LABRCNTIP (procalcitonin:4,lacticidven:4) Microbiology: No results found for this or any previous visit (from the past 240 hour(s)).    Radiology Studies last 3 days: CT Lumbar Spine Wo Contrast  Result Date: 09/05/2022 CLINICAL DATA:  Back trauma.  Fall. EXAM: CT LUMBAR SPINE WITHOUT CONTRAST TECHNIQUE: Multidetector CT imaging of the lumbar spine was performed without intravenous contrast administration. Multiplanar CT image reconstructions were also generated. RADIATION DOSE REDUCTION: This exam was performed according to the departmental dose-optimization program which includes automated exposure control, adjustment of the mA and/or kV according to patient size and/or  use of iterative reconstruction technique. COMPARISON:  Lumbar spine radiographs 08/12/2022. FINDINGS: Segmentation: Conventional numbering is assumed with 5 non-rib-bearing, lumbar type vertebral bodies. Alignment: Normal. Vertebrae: Unchanged chronic compression deformity of the T12 vertebral body with moderate anterior height loss. No  new fracture. Multilevel degenerative endplate changes. Paraspinal and other soft tissues: Atherosclerotic calcifications of the abdominal aorta and its branches. Disc levels: Multilevel lumbar spondylosis, worst at L4-5, where there is at least moderate spinal canal stenosis. IMPRESSION: 1. No acute fracture or traumatic malalignment of the lumbar spine. 2. Unchanged chronic compression deformity of the T12 vertebral body with moderate anterior height loss. 3. Multilevel lumbar spondylosis, worst at L4-5, where there is at least moderate spinal canal stenosis. Aortic Atherosclerosis (ICD10-I70.0). Electronically Signed   By: Orvan Falconer M.D.   On: 09/05/2022 14:48   CT HEAD WO CONTRAST ( )  Result Date: 09/05/2022 CLINICAL DATA:  Head trauma, minor (Age >= 65y); Neck trauma (Age >= 65y). Fall. EXAM: CT HEAD WITHOUT CONTRAST CT CERVICAL SPINE WITHOUT CONTRAST TECHNIQUE: Multidetector CT imaging of the head and cervical spine was performed following the standard protocol without intravenous contrast. Multiplanar CT image reconstructions of the cervical spine were also generated. RADIATION DOSE REDUCTION: This exam was performed according to the departmental dose-optimization program which includes automated exposure control, adjustment of the mA and/or kV according to patient size and/or use of iterative reconstruction technique. COMPARISON:  Head CT and brain MRI 03/24/2022. MRI cervical spine 03/24/2022. FINDINGS: CT HEAD FINDINGS Brain: Within limits of motion artifact, no acute hemorrhage. Unchanged mild chronic small-vessel disease with old lacunar infarct in the right  lentiform nucleus. No hydrocephalus or extra-axial collection. No mass effect or midline shift. Vascular: No hyperdense vessel or unexpected calcification. Skull: No calvarial fracture or suspicious bone lesion. Skull base is unremarkable. Sinuses/Orbits: No acute finding. Other: None. CT CERVICAL SPINE FINDINGS Alignment: Normal. Skull base and vertebrae: Prior C5-C7 ACDF with solid bony fusion across the intervening disc spaces. Hardware is intact. No associated lucency. No acute fracture. Craniocervical junction is intact. Soft tissues and spinal canal: No prevertebral fluid or swelling. No visible canal hematoma. Disc levels: Mild cervical spondylosis without high-grade spinal canal stenosis. Upper chest: Emphysema in the lung apices. Other: None. IMPRESSION: 1. No acute intracranial abnormality. 2. No acute cervical spine fracture or traumatic listhesis. 3. Prior C5-C7 ACDF with solid bony fusion across the intervening disc spaces. Electronically Signed   By: Orvan Falconer M.D.   On: 09/05/2022 14:32   CT Cervical Spine Wo Contrast  Result Date: 09/05/2022 CLINICAL DATA:  Head trauma, minor (Age >= 65y); Neck trauma (Age >= 65y). Fall. EXAM: CT HEAD WITHOUT CONTRAST CT CERVICAL SPINE WITHOUT CONTRAST TECHNIQUE: Multidetector CT imaging of the head and cervical spine was performed following the standard protocol without intravenous contrast. Multiplanar CT image reconstructions of the cervical spine were also generated. RADIATION DOSE REDUCTION: This exam was performed according to the departmental dose-optimization program which includes automated exposure control, adjustment of the mA and/or kV according to patient size and/or use of iterative reconstruction technique. COMPARISON:  Head CT and brain MRI 03/24/2022. MRI cervical spine 03/24/2022. FINDINGS: CT HEAD FINDINGS Brain: Within limits of motion artifact, no acute hemorrhage. Unchanged mild chronic small-vessel disease with old lacunar infarct in  the right lentiform nucleus. No hydrocephalus or extra-axial collection. No mass effect or midline shift. Vascular: No hyperdense vessel or unexpected calcification. Skull: No calvarial fracture or suspicious bone lesion. Skull base is unremarkable. Sinuses/Orbits: No acute finding. Other: None. CT CERVICAL SPINE FINDINGS Alignment: Normal. Skull base and vertebrae: Prior C5-C7 ACDF with solid bony fusion across the intervening disc spaces. Hardware is intact. No associated lucency. No acute fracture. Craniocervical junction is intact. Soft tissues and spinal canal: No  prevertebral fluid or swelling. No visible canal hematoma. Disc levels: Mild cervical spondylosis without high-grade spinal canal stenosis. Upper chest: Emphysema in the lung apices. Other: None. IMPRESSION: 1. No acute intracranial abnormality. 2. No acute cervical spine fracture or traumatic listhesis. 3. Prior C5-C7 ACDF with solid bony fusion across the intervening disc spaces. Electronically Signed   By: Orvan Falconer M.D.   On: 09/05/2022 14:32   DG Tibia/Fibula Left  Result Date: 09/05/2022 CLINICAL DATA:  pain injury EXAM: LEFT TIBIA AND FIBULA - 2 VIEW COMPARISON:  Ankle radiograph from earlier the same day. FINDINGS: There is acute minimally displaced oblique fracture of the later malleolus. Acute fracture of the medial malleolus was better seen on the prior ankle radiograph. No other acute fracture or dislocation. No aggressive osseous lesion. The knee joint appears within normal limits. No significant arthritis. Ankle mortise appears intact. No focal soft tissue swelling. No radiopaque foreign bodies. IMPRESSION: Acute minimally displaced fracture of the lateral malleolus. Electronically Signed   By: Jules Schick M.D.   On: 09/05/2022 14:18   DG Hip Unilat W or Wo Pelvis 2-3 Views Left  Result Date: 09/05/2022 CLINICAL DATA:  pain injury EXAM: DG HIP (WITH OR WITHOUT PELVIS) 2-3V LEFT COMPARISON:  None Available. FINDINGS: Pelvis  is intact with normal and symmetric sacroiliac joints. No acute fracture or dislocation. No aggressive osseous lesion. Visualized sacral arcuate lines are unremarkable. Unremarkable symphysis pubis. There are mild degenerative changes of bilateral hip joints without significant joint space narrowing. Osteophytosis of the superior acetabulum. No radiopaque foreign bodies. IMPRESSION: *No acute fracture or dislocation. Mild degenerative changes of the hip joints. Electronically Signed   By: Jules Schick M.D.   On: 09/05/2022 14:14   DG Ankle Complete Left  Result Date: 09/05/2022 CLINICAL DATA:  fall EXAM: LEFT ANKLE COMPLETE - 3+ VIEW COMPARISON:  None Available. FINDINGS: Acute minimally displaced transsyndesmotic distal fibular fracture. Acute displaced medial malleolar avulsion fracture. Slight cortical irregularity of the posterior malleolus suggestive of a nondisplaced fracture. There is no evidence of severe arthropathy or other focal bone abnormality. Lateral greater than medial subcutaneus soft tissue edema. IMPRESSION: 1. Acute minimally displaced transsyndesmotic distal fibular fracture. 2. Acute displaced medial malleolar avulsion fracture. 3. Slight cortical irregularity of the posterior malleolus suggestive of a nondisplaced fracture. Electronically Signed   By: Tish Frederickson M.D.   On: 09/05/2022 13:48             LOS: 2 days      Sunnie Nielsen, DO [facility] 09/07/2022, 4:28 PM    Dictation software may have been used to generate the above note. Typos may occur and escape review in typed/dictated notes. Please contact Dr Lyn Hollingshead directly for clarity if needed.  Staff may message me via secure chat in Epic  but this may not receive an immediate response,  please page me for urgent matters!  If 7PM-7AM, please contact night coverage [email or web address]

## 2022-09-07 NOTE — Progress Notes (Signed)
PT Cancellation Note  Patient Details Name: Beth [name] MRN: [id number] DOB: [birth year]   Cancelled Treatment:    Reason Eval/Treat Not Completed: Fatigue/lethargy limiting ability to participate Will re-attempt tomorrow as time allows.    , 09/07/2022, 12:40 PM

## 2022-09-07 NOTE — Progress Notes (Signed)
Patient's B/P is maintaining with a diastolic in the 40s, Dr. Lyn Hollingshead notified and ordered Levophed. IV team came to bedside and placed an ultrasound guided IV.  IV watch being placed with initiation of Levophed.

## 2022-09-07 NOTE — Plan of Care (Signed)
Still progressing, hypotension major concerns

## 2022-09-07 NOTE — Progress Notes (Addendum)
1205 Unable to work with PT due to low b/p's. PT/OT will attempt later this evening or tomorrow morning pending b/p

## 2022-09-07 NOTE — Progress Notes (Signed)
1605 Report given to Harriet Butte RN via telephone.   1658 Pt transported to ICU bed 4. no s/s of distress. ICU nurse at bedside.

## 2022-09-07 NOTE — Plan of Care (Signed)

## 2022-09-07 NOTE — Progress Notes (Signed)
 An USGPIV (ultrasound guided PIV) has been placed for short-term vasopressor infusion. A correctly placed ivWatch must be used when administering Vasopressors. Should this treatment be needed beyond 24 hours, central line access should be obtained.  It will be the responsibility of the bedside nurse to follow best practice to prevent extravasations.

## 2022-09-07 NOTE — Progress Notes (Signed)
Received report from [place], [place]

## 2022-09-08 ENCOUNTER — Other Ambulatory Visit: Payer: Self-pay | Admitting: Family Medicine

## 2022-09-08 ENCOUNTER — Other Ambulatory Visit: Payer: Self-pay | Admitting: Student in an Organized Health Care Education/Training Program

## 2022-09-08 DIAGNOSIS — J302 Other seasonal allergic rhinitis: Secondary | ICD-10-CM

## 2022-09-08 DIAGNOSIS — R52 Pain, unspecified: Secondary | ICD-10-CM | POA: Diagnosis not present

## 2022-09-08 LAB — BASIC METABOLIC PANEL WITH GFR
Anion gap: 7 (ref 5–15)
BUN: 20 mg/dL (ref 8–23)
CO2: 24 mmol/L (ref 22–32)
Calcium: 8.5 mg/dL — ABNORMAL LOW (ref 8.9–10.3)
Chloride: 109 mmol/L (ref 98–111)
Creatinine, Ser: 0.74 mg/dL (ref 0.44–1.00)
GFR, Estimated: >60 mL/min (ref >60)
Glucose, Bld: 92 mg/dL (ref 70–99)
Potassium: 3.2 mmol/L — ABNORMAL LOW (ref 3.5–5.1)
Sodium: 140 mmol/L (ref 135–145)

## 2022-09-08 LAB — MAGNESIUM: Magnesium: 1.7 mg/dL (ref 1.7–2.4)

## 2022-09-08 MED ORDER — POTASSIUM CHLORIDE 20 MEQ PO PACK
40.0000 meq | PACK | Freq: Once | ORAL | Status: AC
  Administered 2022-09-08: 40 meq via ORAL
  Filled 2022-09-08: qty 2

## 2022-09-08 MED ORDER — TRAMADOL HCL 50 MG PO TABS
50.0000 mg | ORAL_TABLET | Freq: Two times a day (BID) | ORAL | Status: DC | PRN
  Administered 2022-09-08: 50 mg via ORAL
  Filled 2022-09-08: qty 1

## 2022-09-08 MED ORDER — KETOROLAC TROMETHAMINE 15 MG/ML IJ SOLN
15.0000 mg | Freq: Four times a day (QID) | INTRAMUSCULAR | Status: DC | PRN
  Administered 2022-09-08 – 2022-09-10 (×2): 15 mg via INTRAVENOUS
  Filled 2022-09-08 (×2): qty 1

## 2022-09-08 MED ORDER — OXYCODONE HCL 5 MG PO TABS
5.0000 mg | ORAL_TABLET | Freq: Four times a day (QID) | ORAL | Status: DC | PRN
  Administered 2022-09-08 – 2022-09-10 (×3): 5 mg via ORAL
  Filled 2022-09-08 (×3): qty 1

## 2022-09-08 MED ORDER — MORPHINE SULFATE (PF) 2 MG/ML IV SOLN
2.0000 mg | INTRAVENOUS | Status: DC | PRN
  Administered 2022-09-09 (×2): 2 mg via INTRAVENOUS
  Filled 2022-09-08 (×2): qty 1

## 2022-09-08 NOTE — Consult Note (Signed)
PCCM Consult Note  Levophed was discontinued upon arrival to the ICU as patient had SBP >100 and patient was mentating fine. I suspect that her diastolic is what drives down her MAP and causes it to be artificially low. Especially in setting of clinical stability.   Overnight, she required no re-initiation of pressors.   We gave her some tramadol to help with some acute pain.   She is on a reduced amount of gabapentin. Overall, she had a good night.   - We will sign off at this time.  - Please reach out if clinical status changes or questions arise.   Rhea Bleacher MD Pulmonary & Critical Care Medicine

## 2022-09-08 NOTE — Progress Notes (Signed)
OT Cancellation Note  Patient Details Name: Beth [name] MRN: [id number] DOB: [birth year]   Cancelled Treatment:    Reason Eval/Treat Not Completed: Other (comment) (Noted transfer to ICU yesterday, no continue @ transfer orders in place. Pt will need new OT orders when approrpiate to participate.) Oleta Mouse, OTD OTR/L  09/08/22, 8:09 AM

## 2022-09-08 NOTE — Plan of Care (Signed)

## 2022-09-08 NOTE — Plan of Care (Signed)
Continuing with plan of care. 

## 2022-09-08 NOTE — Progress Notes (Signed)
Patient with complaints of pain to sacrum and right lower leg, 7/10.  Notified Dr. Lyn Hollingshead as patient's previous prn pain medication orders have expired.

## 2022-09-08 NOTE — Progress Notes (Signed)
PROGRESS NOTE    Beth [name]   XBJ:[id number] DOB: 1945/03/12  DOA: 09/05/2022 Date of Service: 09/08/22 PCP: Smitty Cords, DO     Brief Narrative / Hospital Course:  [name] is a 77 year old female with history of depression, anxiety, neuropathy, insomnia, atrial fibrillation, on Eliquis, who presents to the emergency department for chief concerns of a fall in the morning 09/05/22 - Reports that she tripped over her oxygen cord and fell. She denies loss of consciousness. She denies head trauma  08/08: to ED. Left lateral malleolus acute fracture that is minimally displaced. Admitted for ortho consult and pain control, may need SNF/STR. Per ortho (Dr Joice Lofts) plan for nonsurgical treatment w/ splint 08/09: PT/OT recs for SNF. Hypotensive, having to hold opiate pain medications, stopped beta blocker and lasix, and reduced gabapentin, melatonin  08/10: Concern for UTI w/ dysuria and abn UA, starting Rocephin. Cr up slightly, BP lower, scheduled midodrine, 1L NS bolus and BP improved only slightly. MAP <60. Hgb trending down but no s/s bleeding, checked again and trending better. No s/s infection other than UTI and not septic. Bradycardic. Holding opiates, reduced or d/c other potentially sedating meds. Given borderline BP not responding to fluids/midodrine and no clear reason for hypotension other than clinically a bit dry and of course polypharmayc, spoke w/ ICU and will transfer to stepdown for closer monitoring. Briefly was on pressors, but SBP improved >100 and patient was mentating ok, per PCCU suspect that low diastolic drives down her MAP / causes it to be artificially low. PCCU also agrees that frailty and recent injury w/ lack of reserve are most likely cause for low BP/weakness.  08/11: BP somewhat better though diastolic remains low, did not require pressors overnight. PCCM s/o.    Consultants:  Orthopedics - ankle fracture  PCCM -  hypotension  Procedures: none      ASSESSMENT & PLAN:   Principal Problem:   Inadequate pain control Active Problems:   HLD (hyperlipidemia)   HTN (hypertension)   Anxiety and depression   CVA, old, hemiparesis (HCC)   Fibromyalgia syndrome   Benign hypertension   Chronic recurrent major depressive disorder (HCC)   Major depressive disorder, recurrent, severe w/o psychotic behavior (HCC)   Chronic pain syndrome   Atherosclerosis of aorta (HCC)   Pulmonary hypertension (HCC)   Asthma with COPD   Closed fracture of left lateral malleolus   Atrial fibrillation, chronic (HCC)   Acute Left lateral malleolus acute fracture that is minimally displaced Mechanical Fall  Orthopedics following - conservative nonsurgical management Pain control Home Gabapentin 900 mg tid --> 600 tid --> 300 tid Oxycodone held Naproxen 500 mg bid ac Morphine d/c Tramadol restarted w/ close monitoring     Essential HTN (hypertension) but hypotensive here Home furosemide 40 mg daily resumed for 8/9, metoprolol succinate 25 mg daily resumed for 8/8 --> have held these d/t low BP Midodrine tid started  S/p fluid bolus now on maintenance  UTI Rocephin  Await UCx Not septic   AKI Likely poor po intake +/- UTI Fluids as above check another BMP in AM  Atrial fibrillation, chronic (HCC) Patient's last dose of Eliquis was evening of 8/7 Eliquis 5 mg BID held pending serial HH and possibly hemoccult  Metoprolol succinate 25 mg daily held dt/ hypotension   HLD (hyperlipidemia) Home atorvastatin 40 mg nightly held d/t liver enzymes    COPD/Asthma Albuterol prn Breo + Incruse, Singulair   Pulmonary hypertension (HCC) Patient wears 3  L [place] continuously Continue O2 support  Home furosemide held d/t low BP  Chronic pain syndrome Home tramadol not resumed on admission, PDMP reviewed Pain control for ankle fracture - see above   Major depressive disorder, recurrent, severe w/o psychotic  behavior (HCC) Anxiety and depression Home quetiapine 250 mg (confirmed filled with pharmacy) at bedtime held home fluoxetine 20 mg dialy resumed    Hypokalemia Replace as needed Monitor BMP    DVT prophylaxis: eliquis Pertinent IV fluids/nutrition: no contiuous IV fluids  Central lines / invasive devices: none   Code Status: FULL CODE ACP documentation reviewed: 09/06/22 none on file   Current Admission Status: inpatient   TOC needs / Dispo plan: SNF STR Barriers to discharge / significant pending items: placement              Subjective / Brief ROS:  Patient reports pain  Denies CP/SOB.  No pain other than ankle Tolerating diet but low appetite Reports no concerns w/ urination/defecation.   Family Communication: none at this time - she declined call to family stated they will be here later and let us know if any questions     Objective Findings:  Vitals:   09/08/22 0800 09/08/22 0900 09/08/22 1000 09/08/22 1100  BP: (!) 121/49 (!) 103/48 (!) 110/46 (!) 115/53  Pulse: 61 64 62 65  Resp: 17 18 14 19   Temp: 98.1 F (36.7 C)     TempSrc: Oral     SpO2: 100% 91% (!) 88% (!) 89%  Weight:      Height:        Intake/Output Summary (Last 24 hours) at 09/08/2022 1300 Last data filed at 09/08/2022 1100 Gross per 24 hour  Intake 3114.2 ml  Output 600 ml  Net 2514.2 ml   Filed Weights   09/05/22 1249 09/07/22 1659  Weight: 60.3 kg 65.4 kg    Examination:  Physical Exam Constitutional:      General: She is not in acute distress.    Appearance: She is not toxic-appearing.  Cardiovascular:     Rate and Rhythm: Normal rate and regular rhythm.     Heart sounds: Murmur heard.  Pulmonary:     Effort: Pulmonary effort is normal.     Breath sounds: Normal breath sounds.  Musculoskeletal:     Right lower leg: No edema.     Left lower leg: No edema.  Skin:    General: Skin is warm and dry.     Findings: No rash.  Neurological:     General: No focal  deficit present.     Mental Status: She is alert and oriented to person, place, and time.  Psychiatric:        Mood and Affect: Mood normal.        Behavior: Behavior normal.          Scheduled Medications:   Chlorhexidine Gluconate Cloth  6 each Topical Q0600   FLUoxetine  20 mg Oral Daily   fluticasone furoate-vilanterol  1 puff Inhalation Daily   And   umeclidinium bromide  1 puff Inhalation Daily   gabapentin  100 mg Oral TID   midodrine  10 mg Oral TID WC   montelukast  10 mg Oral QHS   naproxen  500 mg Oral BID WC   potassium chloride  40 mEq Oral Once    Continuous Infusions:  sodium chloride 10 mL/hr at 09/08/22 1100   cefTRIAXone (ROCEPHIN)  IV Stopped (09/07/22 1754)   lactated ringers  75 mL/hr at 09/08/22 1100    PRN Medications:  albuterol, ondansetron **OR** ondansetron (ZOFRAN) IV, traMADol  Antimicrobials from admission:  Anti-infectives (From admission, onward)    Start     Dose/Rate Route Frequency Ordered Stop   09/07/22 1600  cefTRIAXone (ROCEPHIN) 1 g in sodium chloride 0.9 % 100 mL IVPB        1 g 200 mL/hr over 30 Minutes Intravenous Every 24 hours 09/07/22 1520             Data Reviewed:  I have personally reviewed the following...  CBC: Recent Labs  Lab 09/05/22 1502 09/06/22 0610 09/07/22 0449 09/07/22 1349  WBC 12.0* 10.1 8.9  --   NEUTROABS 9.8*  --  4.8  --   HGB 10.8* 9.4* 8.8* 9.3*  HCT 33.9* 28.6* 26.9* 28.6*  MCV 96.9 95.3 96.1  --   PLT 325 307 289  --    Basic Metabolic Panel: Recent Labs  Lab 09/05/22 1502 09/06/22 0610 09/07/22 0449 09/08/22 0510 09/08/22 0514  NA 139 139 140  --  140  K 4.6 3.7 3.2*  --  3.2*  CL 102 104 105  --  109  CO2 26 28 29   --  24  GLUCOSE 109* 102* 100*  --  92  BUN 26* 19 26*  --  20  CREATININE 0.97 0.89 1.19*  --  0.74  CALCIUM 9.0 8.8* 8.7*  --  8.5*  MG  --   --  1.9 1.7  --    GFR: Estimated Creatinine Clearance: 52.2 mL/min (by C-G formula based on SCr of 0.74  mg/dL). Liver Function Tests: Recent Labs  Lab 09/05/22 1502 09/06/22 0610 09/07/22 0449  AST 360* 201* 93*  ALT 241* 180* 118*  ALKPHOS 297* 243* 168*  BILITOT 1.6* 1.3* 0.6  PROT 6.6 6.2* 5.6*  ALBUMIN 3.1* 2.8* 2.3*   No results for input(s): "LIPASE", "AMYLASE" in the last 168 hours. No results for input(s): "AMMONIA" in the last 168 hours. Coagulation Profile: No results for input(s): "INR", "PROTIME" in the last 168 hours. Cardiac Enzymes: No results for input(s): "CKTOTAL", "CKMB", "CKMBINDEX", "TROPONINI" in the last 168 hours. BNP (last 3 results) No results for input(s): "PROBNP" in the last 8760 hours. HbA1C: No results for input(s): "HGBA1C" in the last 72 hours. CBG: Recent Labs  Lab 09/07/22 1701  GLUCAP 101*   Lipid Profile: No results for input(s): "CHOL", "HDL", "LDLCALC", "TRIG", "CHOLHDL", "LDLDIRECT" in the last 72 hours. Thyroid Function Tests: No results for input(s): "TSH", "T4TOTAL", "FREET4", "T3FREE", "THYROIDAB" in the last 72 hours. Anemia Panel: No results for input(s): "VITAMINB12", "FOLATE", "FERRITIN", "TIBC", "IRON", "RETICCTPCT" in the last 72 hours. Most Recent Urinalysis On File:     Component Value Date/Time   COLORURINE YELLOW (A) 09/06/2022 1924   APPEARANCEUR HAZY (A) 09/06/2022 1924   APPEARANCEUR Hazy 12/25/2012 1507   LABSPEC 1.015 09/06/2022 1924   LABSPEC 1.015 12/25/2012 1507   PHURINE 5.0 09/06/2022 1924   GLUCOSEU NEGATIVE 09/06/2022 1924   GLUCOSEU Negative 12/25/2012 1507   HGBUR NEGATIVE 09/06/2022 1924   BILIRUBINUR NEGATIVE 09/06/2022 1924   BILIRUBINUR Negative 06/26/2022 1525   BILIRUBINUR Negative 12/25/2012 1507   KETONESUR NEGATIVE 09/06/2022 1924   PROTEINUR NEGATIVE 09/06/2022 1924   UROBILINOGEN 0.2 06/26/2022 1525   NITRITE NEGATIVE 09/06/2022 1924   LEUKOCYTESUR LARGE (A) 09/06/2022 1924   LEUKOCYTESUR 3+ 12/25/2012 1507   Sepsis  Labs: @LABRCNTIP (procalcitonin:4,lacticidven:4) Microbiology: Recent Results (from the past 240 hour(s))  Urine Culture     Status: None (Preliminary result)   Collection Time: 09/06/22  7:24 PM   Specimen: Urine, Random  Result Value Ref Range Status   Specimen Description   Final    URINE, RANDOM Performed at [facility], [address] [place], [place] [address]    Special Requests   Final    NONE Reflexed from [id number] Performed at [facility], [address], [place], [place] [address]    Culture   Final    CULTURE REINCUBATED FOR BETTER GROWTH Performed at [facility], [address] [place], [place] [address]    Report Status PENDING  Incomplete  MRSA Next Gen by PCR, Nasal     Status: None   Collection Time: 09/07/22  5:01 PM   Specimen: Nasal Mucosa; Nasal Swab  Result Value Ref Range Status   MRSA by PCR Next Gen NOT DETECTED NOT DETECTED Final    Comment: (NOTE) The GeneXpert MRSA Assay (FDA approved for NASAL specimens only), is one component of a comprehensive MRSA colonization surveillance program. It is not intended to diagnose MRSA infection nor to guide or monitor treatment for MRSA infections. Test performance is not FDA approved in patients less than 29 years old. Performed at [facility], [address] [place], [place] [address]       Radiology Studies last 3 days: CT Lumbar Spine Wo Contrast  Result Date: 09/05/2022 CLINICAL DATA:  Back trauma.  Fall. EXAM: CT LUMBAR SPINE WITHOUT CONTRAST TECHNIQUE: Multidetector CT imaging of the lumbar spine was performed without intravenous contrast administration. Multiplanar CT image reconstructions were also generated. RADIATION DOSE REDUCTION: This exam was performed according to the departmental dose-optimization program which includes automated exposure control, adjustment of the mA and/or kV according to patient size and/or use of iterative reconstruction technique.  COMPARISON:  Lumbar spine radiographs 08/12/2022. FINDINGS: Segmentation: Conventional numbering is assumed with 5 non-rib-bearing, lumbar type vertebral bodies. Alignment: Normal. Vertebrae: Unchanged chronic compression deformity of the T12 vertebral body with moderate anterior height loss. No new fracture. Multilevel degenerative endplate changes. Paraspinal and other soft tissues: Atherosclerotic calcifications of the abdominal aorta and its branches. Disc levels: Multilevel lumbar spondylosis, worst at L4-5, where there is at least moderate spinal canal stenosis. IMPRESSION: 1. No acute fracture or traumatic malalignment of the lumbar spine. 2. Unchanged chronic compression deformity of the T12 vertebral body with moderate anterior height loss. 3. Multilevel lumbar spondylosis, worst at L4-5, where there is at least moderate spinal canal stenosis. Aortic Atherosclerosis (ICD10-I70.0). Electronically Signed   By: Orvan Falconer M.D.   On: 09/05/2022 14:48   CT HEAD WO CONTRAST ( )  Result Date: 09/05/2022 CLINICAL DATA:  Head trauma, minor (Age >= 65y); Neck trauma (Age >= 65y). Fall. EXAM: CT HEAD WITHOUT CONTRAST CT CERVICAL SPINE WITHOUT CONTRAST TECHNIQUE: Multidetector CT imaging of the head and cervical spine was performed following the standard protocol without intravenous contrast. Multiplanar CT image reconstructions of the cervical spine were also generated. RADIATION DOSE REDUCTION: This exam was performed according to the departmental dose-optimization program which includes automated exposure control, adjustment of the mA and/or kV according to patient size and/or use of iterative reconstruction technique. COMPARISON:  Head CT and brain MRI 03/24/2022. MRI cervical spine 03/24/2022. FINDINGS: CT HEAD FINDINGS Brain: Within limits of motion artifact, no acute hemorrhage. Unchanged mild chronic small-vessel disease with old lacunar infarct in the right lentiform nucleus. No hydrocephalus or  extra-axial collection. No mass effect  or midline shift. Vascular: No hyperdense vessel or unexpected calcification. Skull: No calvarial fracture or suspicious bone lesion. Skull base is unremarkable. Sinuses/Orbits: No acute finding. Other: None. CT CERVICAL SPINE FINDINGS Alignment: Normal. Skull base and vertebrae: Prior C5-C7 ACDF with solid bony fusion across the intervening disc spaces. Hardware is intact. No associated lucency. No acute fracture. Craniocervical junction is intact. Soft tissues and spinal canal: No prevertebral fluid or swelling. No visible canal hematoma. Disc levels: Mild cervical spondylosis without high-grade spinal canal stenosis. Upper chest: Emphysema in the lung apices. Other: None. IMPRESSION: 1. No acute intracranial abnormality. 2. No acute cervical spine fracture or traumatic listhesis. 3. Prior C5-C7 ACDF with solid bony fusion across the intervening disc spaces. Electronically Signed   By: Orvan Falconer M.D.   On: 09/05/2022 14:32   CT Cervical Spine Wo Contrast  Result Date: 09/05/2022 CLINICAL DATA:  Head trauma, minor (Age >= 65y); Neck trauma (Age >= 65y). Fall. EXAM: CT HEAD WITHOUT CONTRAST CT CERVICAL SPINE WITHOUT CONTRAST TECHNIQUE: Multidetector CT imaging of the head and cervical spine was performed following the standard protocol without intravenous contrast. Multiplanar CT image reconstructions of the cervical spine were also generated. RADIATION DOSE REDUCTION: This exam was performed according to the departmental dose-optimization program which includes automated exposure control, adjustment of the mA and/or kV according to patient size and/or use of iterative reconstruction technique. COMPARISON:  Head CT and brain MRI 03/24/2022. MRI cervical spine 03/24/2022. FINDINGS: CT HEAD FINDINGS Brain: Within limits of motion artifact, no acute hemorrhage. Unchanged mild chronic small-vessel disease with old lacunar infarct in the right lentiform nucleus. No  hydrocephalus or extra-axial collection. No mass effect or midline shift. Vascular: No hyperdense vessel or unexpected calcification. Skull: No calvarial fracture or suspicious bone lesion. Skull base is unremarkable. Sinuses/Orbits: No acute finding. Other: None. CT CERVICAL SPINE FINDINGS Alignment: Normal. Skull base and vertebrae: Prior C5-C7 ACDF with solid bony fusion across the intervening disc spaces. Hardware is intact. No associated lucency. No acute fracture. Craniocervical junction is intact. Soft tissues and spinal canal: No prevertebral fluid or swelling. No visible canal hematoma. Disc levels: Mild cervical spondylosis without high-grade spinal canal stenosis. Upper chest: Emphysema in the lung apices. Other: None. IMPRESSION: 1. No acute intracranial abnormality. 2. No acute cervical spine fracture or traumatic listhesis. 3. Prior C5-C7 ACDF with solid bony fusion across the intervening disc spaces. Electronically Signed   By: Orvan Falconer M.D.   On: 09/05/2022 14:32   DG Tibia/Fibula Left  Result Date: 09/05/2022 CLINICAL DATA:  pain injury EXAM: LEFT TIBIA AND FIBULA - 2 VIEW COMPARISON:  Ankle radiograph from earlier the same day. FINDINGS: There is acute minimally displaced oblique fracture of the later malleolus. Acute fracture of the medial malleolus was better seen on the prior ankle radiograph. No other acute fracture or dislocation. No aggressive osseous lesion. The knee joint appears within normal limits. No significant arthritis. Ankle mortise appears intact. No focal soft tissue swelling. No radiopaque foreign bodies. IMPRESSION: Acute minimally displaced fracture of the lateral malleolus. Electronically Signed   By: Jules Schick M.D.   On: 09/05/2022 14:18   DG Hip Unilat W or Wo Pelvis 2-3 Views Left  Result Date: 09/05/2022 CLINICAL DATA:  pain injury EXAM: DG HIP (WITH OR WITHOUT PELVIS) 2-3V LEFT COMPARISON:  None Available. FINDINGS: Pelvis is intact with normal and  symmetric sacroiliac joints. No acute fracture or dislocation. No aggressive osseous lesion. Visualized sacral arcuate lines are unremarkable. Unremarkable symphysis pubis.  There are mild degenerative changes of bilateral hip joints without significant joint space narrowing. Osteophytosis of the superior acetabulum. No radiopaque foreign bodies. IMPRESSION: *No acute fracture or dislocation. Mild degenerative changes of the hip joints. Electronically Signed   By: Jules Schick M.D.   On: 09/05/2022 14:14   DG Ankle Complete Left  Result Date: 09/05/2022 CLINICAL DATA:  fall EXAM: LEFT ANKLE COMPLETE - 3+ VIEW COMPARISON:  None Available. FINDINGS: Acute minimally displaced transsyndesmotic distal fibular fracture. Acute displaced medial malleolar avulsion fracture. Slight cortical irregularity of the posterior malleolus suggestive of a nondisplaced fracture. There is no evidence of severe arthropathy or other focal bone abnormality. Lateral greater than medial subcutaneus soft tissue edema. IMPRESSION: 1. Acute minimally displaced transsyndesmotic distal fibular fracture. 2. Acute displaced medial malleolar avulsion fracture. 3. Slight cortical irregularity of the posterior malleolus suggestive of a nondisplaced fracture. Electronically Signed   By: Tish Frederickson M.D.   On: 09/05/2022 13:48             LOS: 3 days      Sunnie Nielsen, DO [facility] 09/08/2022, 1:00 PM    Dictation software may have been used to generate the above note. Typos may occur and escape review in typed/dictated notes. Please contact Dr Lyn Hollingshead directly for clarity if needed.  Staff may message me via secure chat in Epic  but this may not receive an immediate response,  please page me for urgent matters!  If 7PM-7AM, please contact night coverage [email or web address]

## 2022-09-09 ENCOUNTER — Inpatient Hospital Stay
Admit: 2022-09-09 | Discharge: 2022-09-09 | Disposition: A | Discharge status: Home / Self Care | Payer: 59 | Attending: Osteopathic Medicine | Admitting: Osteopathic Medicine

## 2022-09-09 ENCOUNTER — Other Ambulatory Visit: Payer: Self-pay

## 2022-09-09 DIAGNOSIS — R52 Pain, unspecified: Secondary | ICD-10-CM | POA: Diagnosis not present

## 2022-09-09 DIAGNOSIS — I4891 Unspecified atrial fibrillation: Secondary | ICD-10-CM

## 2022-09-09 DIAGNOSIS — I10 Essential (primary) hypertension: Secondary | ICD-10-CM

## 2022-09-09 LAB — ECHOCARDIOGRAM COMPLETE
AR max vel: 2.28 cm2
AV Area VTI: 2.64 cm2
AV Area mean vel: 2.11 cm2
AV Mean grad: 5 mmHg
AV Peak grad: 7.2 mmHg
Ao pk vel: 1.34 m/s
Area-P 1/2: 5.2 cm2
Height: 65 in
S' Lateral: 2.3 cm
Weight: 2306.89 oz

## 2022-09-09 LAB — PROTIME-INR
INR: 1.4 — ABNORMAL HIGH (ref 0.8–1.2)
Prothrombin Time: 17 seconds — ABNORMAL HIGH (ref 11.4–15.2)

## 2022-09-09 LAB — TSH: TSH: 7.029 u[IU]/mL — ABNORMAL HIGH (ref 0.350–4.500)

## 2022-09-09 LAB — APTT
aPTT: 31 seconds (ref 24–36)
aPTT: 87 seconds — ABNORMAL HIGH (ref 24–36)

## 2022-09-09 LAB — HEPARIN LEVEL (UNFRACTIONATED): Heparin Unfractionated: >1.1 IU/mL — ABNORMAL HIGH (ref 0.30–0.70)

## 2022-09-09 MED ORDER — ADENOSINE 12 MG/4ML IV SOLN
12.0000 mg | Freq: Once | INTRAVENOUS | Status: AC
  Administered 2022-09-09: 6 mg via INTRAVENOUS

## 2022-09-09 MED ORDER — DILTIAZEM HCL 25 MG/5ML IV SOLN
10.0000 mg | Freq: Once | INTRAVENOUS | Status: AC
  Administered 2022-09-09: 10 mg via INTRAVENOUS

## 2022-09-09 MED ORDER — DILTIAZEM HCL 25 MG/5ML IV SOLN
INTRAVENOUS | Status: AC
  Filled 2022-09-09: qty 5

## 2022-09-09 MED ORDER — DILTIAZEM HCL-DEXTROSE 125-5 MG/125ML-% IV SOLN (PREMIX)
INTRAVENOUS | Status: AC
  Filled 2022-09-09: qty 125

## 2022-09-09 MED ORDER — MELATONIN 5 MG PO TABS
5.0000 mg | ORAL_TABLET | Freq: Every day | ORAL | Status: DC
  Administered 2022-09-09 – 2022-09-12 (×4): 5 mg via ORAL
  Filled 2022-09-09 (×4): qty 1

## 2022-09-09 MED ORDER — TRAZODONE HCL 50 MG PO TABS
50.0000 mg | ORAL_TABLET | Freq: Every day | ORAL | Status: DC
  Administered 2022-09-09 – 2022-09-12 (×4): 50 mg via ORAL
  Filled 2022-09-09 (×5): qty 1

## 2022-09-09 MED ORDER — HEPARIN (PORCINE) 25000 UT/250ML-% IV SOLN
950.0000 [IU]/h | INTRAVENOUS | Status: DC
  Administered 2022-09-09 – 2022-09-10 (×2): 950 [IU]/h via INTRAVENOUS
  Filled 2022-09-09 (×2): qty 250

## 2022-09-09 MED ORDER — ADENOSINE 12 MG/4ML IV SOLN
INTRAVENOUS | Status: AC
  Filled 2022-09-09: qty 4

## 2022-09-09 MED ORDER — DILTIAZEM HCL-DEXTROSE 125-5 MG/125ML-% IV SOLN (PREMIX)
5.0000 mg/h | INTRAVENOUS | Status: DC
  Administered 2022-09-09: 5 mg/h via INTRAVENOUS

## 2022-09-09 MED ORDER — HEPARIN BOLUS VIA INFUSION
3250.0000 [IU] | Freq: Once | INTRAVENOUS | Status: AC
  Administered 2022-09-09: 3250 [IU] via INTRAVENOUS
  Filled 2022-09-09: qty 3250

## 2022-09-09 MED ORDER — MELATONIN 5 MG PO TABS
2.5000 mg | ORAL_TABLET | Freq: Every evening | ORAL | Status: DC | PRN
  Administered 2022-09-09: 2.5 mg via ORAL
  Filled 2022-09-09: qty 1
  Filled 2022-09-09: qty 0.5

## 2022-09-09 MED ORDER — ADENOSINE 12 MG/4ML IV SOLN
INTRAVENOUS | Status: AC
  Administered 2022-09-09: 12 mg
  Filled 2022-09-09: qty 4

## 2022-09-09 MED ORDER — ADENOSINE 12 MG/4ML IV SOLN
6.0000 mg | Freq: Once | INTRAVENOUS | Status: AC
  Administered 2022-09-09: 12 mg via INTRAVENOUS

## 2022-09-09 MED ORDER — METOPROLOL TARTRATE 25 MG PO TABS
12.5000 mg | ORAL_TABLET | Freq: Two times a day (BID) | ORAL | Status: DC
  Administered 2022-09-09 – 2022-09-13 (×9): 12.5 mg via ORAL
  Filled 2022-09-09 (×9): qty 1

## 2022-09-09 NOTE — Progress Notes (Signed)
PROGRESS NOTE    Beth [name]   WUJ:[id number] DOB: [birth year]  DOA: 09/05/2022 Date of Service: 09/09/22 PCP: Smitty Cords, DO     Brief Narrative / Hospital Course:  [name] is a 77 year old female with history of depression, anxiety, neuropathy, insomnia, atrial fibrillation, on Eliquis, who presents to the emergency department for chief concerns of a fall in the morning 09/05/22 - Reports that she tripped over her oxygen cord and fell. She denies loss of consciousness. She denies head trauma  08/08: to ED. Left lateral malleolus acute fracture that is minimally displaced. Admitted for ortho consult and pain control, may need SNF/STR. Per ortho (Dr Joice Lofts) plan for nonsurgical treatment w/ splint 08/09: PT/OT recs for SNF. Hypotensive, having to hold opiate pain medications, stopped beta blocker and lasix, and reduced gabapentin, melatonin  08/10: Concern for UTI w/ dysuria and abn UA, starting Rocephin. Cr up slightly, BP lower, scheduled midodrine, 1L NS bolus and BP improved only slightly. MAP <60. Hgb trending down but no s/s bleeding, checked again and trending better. No s/s infection other than UTI and not septic. Bradycardic. Holding opiates, reduced or d/c other potentially sedating meds. Given borderline BP not responding to fluids/midodrine and no clear reason for hypotension other than clinically a bit dry and of course polypharmayc, spoke w/ ICU and will transfer to stepdown for closer monitoring. Briefly was on pressors, but SBP improved >100 and patient was mentating ok, per PCCU suspect that low diastolic drives down her MAP / causes it to be artificially low. PCCU also agrees that frailty and recent injury w/ lack of reserve are most likely cause for low BP/weakness.  08/11: BP somewhat better though diastolic remains low, did not require pressors overnight. PCCM s/o.  08/12: BP improved. Hgb remains low 8.5. This AM SVT/Afib RVR, improved w/ diltiazem  and on gtt, echo ordered, heparin gtt since she's been off Eliquis d/t anemia and in case needing any procedures / reversal. Cardiology consult. Converted to sinus this afternoon.    Consultants:  Orthopedics - ankle fracture  PCCM - hypotension  Procedures: none      ASSESSMENT & PLAN:   Principal Problem:   Inadequate pain control Active Problems:   HLD (hyperlipidemia)   HTN (hypertension)   Anxiety and depression   CVA, old, hemiparesis (HCC)   Fibromyalgia syndrome   Benign hypertension   Chronic recurrent major depressive disorder (HCC)   Major depressive disorder, recurrent, severe w/o psychotic behavior (HCC)   Chronic pain syndrome   Atherosclerosis of aorta (HCC)   Pulmonary hypertension (HCC)   Asthma with COPD   Closed fracture of left lateral malleolus   Atrial fibrillation, chronic (HCC)   Afib RVR, question SVT difficult to determine on telemetry but seems more likely Afib on EKG Atrial fibrillation, chronic (HCC) Diltiazem gtt heparin gtt since she's been off Eliquis d/t anemia and in case needing any procedures / reversal. Patient's last dose of Eliquis was evening of 8/7 Cardiology consult Metoprolol succinate 25 mg daily was held dt/ hypotension, rate control per cardiology --> restart metoprolol 12.5 mg bid TSH  keep serum potassium greater than 4 less than 5 and Mg 2 daily BMP  Acute Left lateral malleolus acute fracture that is minimally displaced Mechanical Fall  Orthopedics following - conservative nonsurgical management Pain control Home Gabapentin 900 mg tid --> 600 tid --> 300 tid Oxycodone low dose if BP ok (SBP >110) Naproxen 500 mg bid ac Morphine d/c Tramadol restarted  w/ close monitoring     Essential HTN (hypertension) but hypotensive here Home furosemide 40 mg daily resumed for 8/9, metoprolol succinate 25 mg daily resumed for 8/8 --> have held these d/t low BP Midodrine tid started  D/c or continue fluids pending echo read -  if low EF obviously will d/c  Wean midodrine as able   UTI Rocephin  Await UCx Not septic   AKI Likely poor po intake +/- UTI Fluids as above check another BMP in AM  HLD (hyperlipidemia) Home atorvastatin 40 mg nightly held d/t liver enzymes    COPD/Asthma Albuterol prn Breo + Incruse, Singulair   Pulmonary hypertension (HCC) Patient wears 3 L Greasy continuously Continue O2 support  Home furosemide has been held d/t low BP Echo today   Chronic pain syndrome Pain control for ankle fracture - see above   Major depressive disorder, recurrent, severe w/o psychotic behavior (HCC) Anxiety and depression Home quetiapine 250 mg (confirmed filled with pharmacy) at bedtime held home fluoxetine 20 mg dialy resumed    Hypokalemia Replace as needed Monitor BMP    DVT prophylaxis: eliquis Pertinent IV fluids/nutrition: no contiuous IV fluids  Central lines / invasive devices: none   Code Status: FULL CODE ACP documentation reviewed: 09/06/22 none on file   Current Admission Status: inpatient   TOC needs / Dispo plan: SNF STR Barriers to discharge / significant pending items: placement, clinical improvement              Subjective / Brief ROS:  Patient reports anxiety this morning Palpitations w/ RVR  No dizziness or lightheadedness No chest pain   Family Communication: call to son this morning     Objective Findings:  Vitals:   09/09/22 1000 09/09/22 1100 09/09/22 1200 09/09/22 1300  BP: 126/64 (!) 131/90 (!) 104/50 (!) 104/39  Pulse: 64 (!) 145 78 64  Resp: 19 16 13  (!) 29  Temp:   98.1 F (36.7 C)   TempSrc:      SpO2: 92% 94% 93% 93%  Weight:      Height:        Intake/Output Summary (Last 24 hours) at 09/09/2022 1311 Last data filed at 09/09/2022 1100 Gross per 24 hour  Intake 2315.18 ml  Output 625 ml  Net 1690.18 ml   Filed Weights   09/05/22 1249 09/07/22 1659  Weight: 60.3 kg 65.4 kg    Examination:  Physical  Exam Constitutional:      General: She is not in acute distress.    Appearance: She is not toxic-appearing.  Cardiovascular:     Rate and Rhythm: Normal rate and regular rhythm.     Heart sounds: Murmur heard.  Pulmonary:     Effort: Pulmonary effort is normal.     Breath sounds: Normal breath sounds.  Musculoskeletal:     Right lower leg: No edema.     Left lower leg: No edema.  Skin:    General: Skin is warm and dry.     Findings: No rash.  Neurological:     General: No focal deficit present.     Mental Status: She is alert and oriented to person, place, and time.  Psychiatric:        Mood and Affect: Mood normal.        Behavior: Behavior normal.          Scheduled Medications:   Chlorhexidine Gluconate Cloth  6 each Topical Q0600   FLUoxetine  20 mg Oral Daily  fluticasone furoate-vilanterol  1 puff Inhalation Daily   And   umeclidinium bromide  1 puff Inhalation Daily   gabapentin  100 mg Oral TID   metoprolol tartrate  12.5 mg Oral BID   midodrine  10 mg Oral TID WC   montelukast  10 mg Oral QHS   naproxen  500 mg Oral BID WC    Continuous Infusions:  sodium chloride Stopped (09/09/22 0940)   cefTRIAXone (ROCEPHIN)  IV Stopped (09/08/22 1645)   diltiazem (CARDIZEM) infusion 15 mg/hr (09/09/22 1050)   heparin 950 Units/hr (09/09/22 1129)   lactated ringers 75 mL/hr at 09/09/22 1050    PRN Medications:  albuterol, ketorolac, melatonin, morphine injection, ondansetron **OR** ondansetron (ZOFRAN) IV, oxyCODONE  Antimicrobials from admission:  Anti-infectives (From admission, onward)    Start     Dose/Rate Route Frequency Ordered Stop   09/07/22 1600  cefTRIAXone (ROCEPHIN) 1 g in sodium chloride 0.9 % 100 mL IVPB        1 g 200 mL/hr over 30 Minutes Intravenous Every 24 hours 09/07/22 1520             Data Reviewed:  I have personally reviewed the following...  CBC: Recent Labs  Lab 09/05/22 1502 09/06/22 0610 09/07/22 0449  09/07/22 1349 09/09/22 0352  WBC 12.0* 10.1 8.9  --  8.9  NEUTROABS 9.8*  --  4.8  --   --   HGB 10.8* 9.4* 8.8* 9.3* 8.5*  HCT 33.9* 28.6* 26.9* 28.6* 26.1*  MCV 96.9 95.3 96.1  --  96.7  PLT 325 307 289  --  304   Basic Metabolic Panel: Recent Labs  Lab 09/05/22 1502 09/06/22 0610 09/07/22 0449 09/08/22 0510 09/08/22 0514 09/09/22 0352  NA 139 139 140  --  140 144  K 4.6 3.7 3.2*  --  3.2* 3.7  CL 102 104 105  --  109 112*  CO2 26 28 29   --  24 26  GLUCOSE 109* 102* 100*  --  92 99  BUN 26* 19 26*  --  20 16  CREATININE 0.97 0.89 1.19*  --  0.74 0.79  CALCIUM 9.0 8.8* 8.7*  --  8.5* 8.7*  MG  --   --  1.9 1.7  --   --    GFR: Estimated Creatinine Clearance: 52.2 mL/min (by C-G formula based on SCr of 0.79 mg/dL). Liver Function Tests: Recent Labs  Lab 09/05/22 1502 09/06/22 0610 09/07/22 0449 09/09/22 0352  AST 360* 201* 93* 35  ALT 241* 180* 118* 64*  ALKPHOS 297* 243* 168* 129*  BILITOT 1.6* 1.3* 0.6 0.8  PROT 6.6 6.2* 5.6* 5.4*  ALBUMIN 3.1* 2.8* 2.3* 2.4*   No results for input(s): "LIPASE", "AMYLASE" in the last 168 hours. No results for input(s): "AMMONIA" in the last 168 hours. Coagulation Profile: Recent Labs  Lab 09/09/22 1046  INR 1.4*   Cardiac Enzymes: No results for input(s): "CKTOTAL", "CKMB", "CKMBINDEX", "TROPONINI" in the last 168 hours. BNP (last 3 results) No results for input(s): "PROBNP" in the last 8760 hours. HbA1C: No results for input(s): "HGBA1C" in the last 72 hours. CBG: Recent Labs  Lab 09/07/22 1701  GLUCAP 101*   Lipid Profile: No results for input(s): "CHOL", "HDL", "LDLCALC", "TRIG", "CHOLHDL", "LDLDIRECT" in the last 72 hours. Thyroid Function Tests: No results for input(s): "TSH", "T4TOTAL", "FREET4", "T3FREE", "THYROIDAB" in the last 72 hours. Anemia Panel: No results for input(s): "VITAMINB12", "FOLATE", "FERRITIN", "TIBC", "IRON", "RETICCTPCT" in the last 72 hours. Most  Recent Urinalysis On File:      Component Value Date/Time   COLORURINE YELLOW (A) 09/06/2022 1924   APPEARANCEUR HAZY (A) 09/06/2022 1924   APPEARANCEUR Hazy 12/25/2012 1507   LABSPEC 1.015 09/06/2022 1924   LABSPEC 1.015 12/25/2012 1507   PHURINE 5.0 09/06/2022 1924   GLUCOSEU NEGATIVE 09/06/2022 1924   GLUCOSEU Negative 12/25/2012 1507   HGBUR NEGATIVE 09/06/2022 1924   BILIRUBINUR NEGATIVE 09/06/2022 1924   BILIRUBINUR Negative 06/26/2022 1525   BILIRUBINUR Negative 12/25/2012 1507   KETONESUR NEGATIVE 09/06/2022 1924   PROTEINUR NEGATIVE 09/06/2022 1924   UROBILINOGEN 0.2 06/26/2022 1525   NITRITE NEGATIVE 09/06/2022 1924   LEUKOCYTESUR LARGE (A) 09/06/2022 1924   LEUKOCYTESUR 3+ 12/25/2012 1507   Sepsis Labs: @LABRCNTIP (procalcitonin:4,lacticidven:4) Microbiology: Recent Results (from the past 240 hour(s))  Urine Culture     Status: Abnormal   Collection Time: 09/06/22  7:24 PM   Specimen: Urine, Random  Result Value Ref Range Status   Specimen Description   Final    URINE, RANDOM Performed at [facility], [address], [place], [place] [address]    Special Requests   Final    NONE Reflexed from [id number] Performed at [facility], [address] [place], [place] [address]    Culture MULTIPLE SPECIES PRESENT, SUGGEST RECOLLECTION (A)  Final   Report Status 09/09/2022 FINAL  Final  MRSA Next Gen by PCR, Nasal     Status: None   Collection Time: 09/07/22  5:01 PM   Specimen: Nasal Mucosa; Nasal Swab  Result Value Ref Range Status   MRSA by PCR Next Gen NOT DETECTED NOT DETECTED Final    Comment: (NOTE) The GeneXpert MRSA Assay (FDA approved for NASAL specimens only), is one component of a comprehensive MRSA colonization surveillance program. It is not intended to diagnose MRSA infection nor to guide or monitor treatment for MRSA infections. Test performance is not FDA approved in patients less than 35 years old. Performed at [facility], [address]  [place], [place] [address]       Radiology Studies last 3 days: CT Lumbar Spine Wo Contrast  Result Date: 09/05/2022 CLINICAL DATA:  Back trauma.  Fall. EXAM: CT LUMBAR SPINE WITHOUT CONTRAST TECHNIQUE: Multidetector CT imaging of the lumbar spine was performed without intravenous contrast administration. Multiplanar CT image reconstructions were also generated. RADIATION DOSE REDUCTION: This exam was performed according to the departmental dose-optimization program which includes automated exposure control, adjustment of the mA and/or kV according to patient size and/or use of iterative reconstruction technique. COMPARISON:  Lumbar spine radiographs 08/12/2022. FINDINGS: Segmentation: Conventional numbering is assumed with 5 non-rib-bearing, lumbar type vertebral bodies. Alignment: Normal. Vertebrae: Unchanged chronic compression deformity of the T12 vertebral body with moderate anterior height loss. No new fracture. Multilevel degenerative endplate changes. Paraspinal and other soft tissues: Atherosclerotic calcifications of the abdominal aorta and its branches. Disc levels: Multilevel lumbar spondylosis, worst at L4-5, where there is at least moderate spinal canal stenosis. IMPRESSION: 1. No acute fracture or traumatic malalignment of the lumbar spine. 2. Unchanged chronic compression deformity of the T12 vertebral body with moderate anterior height loss. 3. Multilevel lumbar spondylosis, worst at L4-5, where there is at least moderate spinal canal stenosis. Aortic Atherosclerosis (ICD10-I70.0). Electronically Signed   By: Orvan Falconer M.D.   On: 09/05/2022 14:48   CT HEAD WO CONTRAST ( )  Result Date: 09/05/2022 CLINICAL DATA:  Head trauma, minor (Age >= 65y); Neck trauma (Age >= 65y). Fall. EXAM: CT HEAD  WITHOUT CONTRAST CT CERVICAL SPINE WITHOUT CONTRAST TECHNIQUE: Multidetector CT imaging of the head and cervical spine was performed following the standard protocol without intravenous contrast.  Multiplanar CT image reconstructions of the cervical spine were also generated. RADIATION DOSE REDUCTION: This exam was performed according to the departmental dose-optimization program which includes automated exposure control, adjustment of the mA and/or kV according to patient size and/or use of iterative reconstruction technique. COMPARISON:  Head CT and brain MRI 03/24/2022. MRI cervical spine 03/24/2022. FINDINGS: CT HEAD FINDINGS Brain: Within limits of motion artifact, no acute hemorrhage. Unchanged mild chronic small-vessel disease with old lacunar infarct in the right lentiform nucleus. No hydrocephalus or extra-axial collection. No mass effect or midline shift. Vascular: No hyperdense vessel or unexpected calcification. Skull: No calvarial fracture or suspicious bone lesion. Skull base is unremarkable. Sinuses/Orbits: No acute finding. Other: None. CT CERVICAL SPINE FINDINGS Alignment: Normal. Skull base and vertebrae: Prior C5-C7 ACDF with solid bony fusion across the intervening disc spaces. Hardware is intact. No associated lucency. No acute fracture. Craniocervical junction is intact. Soft tissues and spinal canal: No prevertebral fluid or swelling. No visible canal hematoma. Disc levels: Mild cervical spondylosis without high-grade spinal canal stenosis. Upper chest: Emphysema in the lung apices. Other: None. IMPRESSION: 1. No acute intracranial abnormality. 2. No acute cervical spine fracture or traumatic listhesis. 3. Prior C5-C7 ACDF with solid bony fusion across the intervening disc spaces. Electronically Signed   By: Orvan Falconer M.D.   On: 09/05/2022 14:32   CT Cervical Spine Wo Contrast  Result Date: 09/05/2022 CLINICAL DATA:  Head trauma, minor (Age >= 65y); Neck trauma (Age >= 65y). Fall. EXAM: CT HEAD WITHOUT CONTRAST CT CERVICAL SPINE WITHOUT CONTRAST TECHNIQUE: Multidetector CT imaging of the head and cervical spine was performed following the standard protocol without intravenous  contrast. Multiplanar CT image reconstructions of the cervical spine were also generated. RADIATION DOSE REDUCTION: This exam was performed according to the departmental dose-optimization program which includes automated exposure control, adjustment of the mA and/or kV according to patient size and/or use of iterative reconstruction technique. COMPARISON:  Head CT and brain MRI 03/24/2022. MRI cervical spine 03/24/2022. FINDINGS: CT HEAD FINDINGS Brain: Within limits of motion artifact, no acute hemorrhage. Unchanged mild chronic small-vessel disease with old lacunar infarct in the right lentiform nucleus. No hydrocephalus or extra-axial collection. No mass effect or midline shift. Vascular: No hyperdense vessel or unexpected calcification. Skull: No calvarial fracture or suspicious bone lesion. Skull base is unremarkable. Sinuses/Orbits: No acute finding. Other: None. CT CERVICAL SPINE FINDINGS Alignment: Normal. Skull base and vertebrae: Prior C5-C7 ACDF with solid bony fusion across the intervening disc spaces. Hardware is intact. No associated lucency. No acute fracture. Craniocervical junction is intact. Soft tissues and spinal canal: No prevertebral fluid or swelling. No visible canal hematoma. Disc levels: Mild cervical spondylosis without high-grade spinal canal stenosis. Upper chest: Emphysema in the lung apices. Other: None. IMPRESSION: 1. No acute intracranial abnormality. 2. No acute cervical spine fracture or traumatic listhesis. 3. Prior C5-C7 ACDF with solid bony fusion across the intervening disc spaces. Electronically Signed   By: Orvan Falconer M.D.   On: 09/05/2022 14:32   DG Tibia/Fibula Left  Result Date: 09/05/2022 CLINICAL DATA:  pain injury EXAM: LEFT TIBIA AND FIBULA - 2 VIEW COMPARISON:  Ankle radiograph from earlier the same day. FINDINGS: There is acute minimally displaced oblique fracture of the later malleolus. Acute fracture of the medial malleolus was better seen on the prior  ankle  radiograph. No other acute fracture or dislocation. No aggressive osseous lesion. The knee joint appears within normal limits. No significant arthritis. Ankle mortise appears intact. No focal soft tissue swelling. No radiopaque foreign bodies. IMPRESSION: Acute minimally displaced fracture of the lateral malleolus. Electronically Signed   By: Jules Schick M.D.   On: 09/05/2022 14:18   DG Hip Unilat W or Wo Pelvis 2-3 Views Left  Result Date: 09/05/2022 CLINICAL DATA:  pain injury EXAM: DG HIP (WITH OR WITHOUT PELVIS) 2-3V LEFT COMPARISON:  None Available. FINDINGS: Pelvis is intact with normal and symmetric sacroiliac joints. No acute fracture or dislocation. No aggressive osseous lesion. Visualized sacral arcuate lines are unremarkable. Unremarkable symphysis pubis. There are mild degenerative changes of bilateral hip joints without significant joint space narrowing. Osteophytosis of the superior acetabulum. No radiopaque foreign bodies. IMPRESSION: *No acute fracture or dislocation. Mild degenerative changes of the hip joints. Electronically Signed   By: Jules Schick M.D.   On: 09/05/2022 14:14   DG Ankle Complete Left  Result Date: 09/05/2022 CLINICAL DATA:  fall EXAM: LEFT ANKLE COMPLETE - 3+ VIEW COMPARISON:  None Available. FINDINGS: Acute minimally displaced transsyndesmotic distal fibular fracture. Acute displaced medial malleolar avulsion fracture. Slight cortical irregularity of the posterior malleolus suggestive of a nondisplaced fracture. There is no evidence of severe arthropathy or other focal bone abnormality. Lateral greater than medial subcutaneus soft tissue edema. IMPRESSION: 1. Acute minimally displaced transsyndesmotic distal fibular fracture. 2. Acute displaced medial malleolar avulsion fracture. 3. Slight cortical irregularity of the posterior malleolus suggestive of a nondisplaced fracture. Electronically Signed   By: Tish Frederickson M.D.   On: 09/05/2022 13:48              LOS: 4 days      Sunnie Nielsen, DO [facility] 09/09/2022, 1:11 PM    Dictation software may have been used to generate the above note. Typos may occur and escape review in typed/dictated notes. Please contact Dr Lyn Hollingshead directly for clarity if needed.  Staff may message me via secure chat in Epic  but this may not receive an immediate response,  please page me for urgent matters!  If 7PM-7AM, please contact night coverage [email or web address]

## 2022-09-09 NOTE — Progress Notes (Signed)
Pt converted to NRS, BP 104/50, RR 13, 93% on 3L N/C. Dr. Lyn Hollingshead and Frutoso Schatz NP, notified.

## 2022-09-09 NOTE — Consult Note (Signed)
Cardiology Consultation   Patient ID: SKYLIE DOBRANSKY MRN: [id number]; DOB: 09/25/1945  Admit date: 09/05/2022 Date of Consult: 09/09/2022  PCP:  Smitty Cords, DO   [facility] Providers Cardiologist:  Debbe Odea, MD        Patient Profile:   Beth [name] is a 77 y.o. female with a hx of paroxysmal atrial fibrillation on chronic apixaban, depression/anxiety, neuropathy, insomnia, asthma, HFpEF, CVA, COPD, former smoker, hypertension, hyperlipidemia who is being seen 09/09/2022 for the evaluation of atrial fibrillation RVR at the request of Dr. Lyn Hollingshead.  History of Present Illness:   Beth [name] 77 year old female with previously mentioned complex past medical history.  She had previously had Myoview stress testing completed 04/2019 that showed no evidence of ischemia.  Echocardiogram in 09/2020 revealed LVEF 55 to 60%, G2 DD, RVSP of 60 mmHg. She was last seen in clinic by Dr Azucena Cecil on 01/01/21.  At that time she continued to have peripheral edema likely lymphedema.  They had stopped and torsemide which she had been trialed on and restarted on furosemide.  She was also referred to vein and vascular for possible lymphedema management.  She continued to see pulmonary for his COPD as well as her last echocardiogram revealing pulmonary hypertension.  She was hospitalized at [facility] in 02/2022 for weakness for the last 2 days, multiple mechanical falls, and decreased sensation to the left side of her face and slight left facial droop a code stroke was called.  Patient underwent MRI of the brain and neck which showed no evidence of stroke and no significant spinal stenosis.  She was evaluated by neurology and her condition was not consistent with a TIA or stroke.  For the left-sided weakness she underwent PT/OT they recommended nursing home placement however the patient and her son refused to go to a nursing home.  During hospitalization she did have repeat  echocardiogram that showed an EF 60 to 65%, no regional wall motion abnormalities, mild to moderate mitral regurgitation, and moderate to severe TR. she was considered stable and discharged on 07/24/2022.  She presented back to [facility] emergency department 08/25/2022 with complaint of shortness of breath.  She was satting 85% on room air in upper 90s on 3 L.  She also complained of a slight cough and increased lower extremity edema.  Blood pressure was 140/74, respirations of 21, pulse of 84.  She was treated with Solu-Medrol and DuoNebs 60 mg of furosemide.  She was admitted to PCU under the hospitalist service was diuresed, repeat echocardiogram revealed an LVEF of 55 to 60%, no regional wall motion abnormalities, LVH, and severely elevated pulmonary artery systolic pressure, mild MR and moderate TR. high-sensitivity troponin peaked at 85 but was thought to be demand ischemia as her symptoms were not consistent with ACS.  She was treated for UTI as well.  Again therapy recommended skilled nursing facility and patient continued to decline.She was considered stable for discharge and discharged back home on 08/28/2022.She presented back to the [facility] emergency department on 08/30/2022 with complaint of chest pain.  EMS found the patient to have a heart rate in the 220s.  EMS gave 10 mg of Cardizem IV.  Patient states she was just sitting in the house under anything particular and usual when she started feeling symptoms.  Denies a history of atrial fibrillation.  Blood pressure 137/78, pulse 162, respirations of 20, temperature of 98.  EKG revealed atrial fibrillation with RVR.  She was given diltiazem 15 mg  IVP and placed on diltiazem drip.  Shortly thereafter she converted back to sinus rhythm.  Blood work was unrevealing.  She was started on apixaban 5 mg twice daily and her metoprolol was increased to 25 mg daily and she was able to be discharged home.  Patient presented back to the [facility] emergency department on 09/05/2022  status post mechanical fall.  Patient states she tripped over a nasal cannula and fell hitting the back of her head, neck, and twisted her left ankle.  There was no LOC.  Daughter states she appeared to be in a lot of pain but she has chronic pain.  She denies chest pain or shortness of breath at the time.  Initial vitals were 133/57 for blood pressure, pulse of 73, respirations of 18, temperature 98.4.  Labs were pertinent for blood glucose of 109, BUN 26, albumin 3.1, AST 360, ALT of 241, alkaline phos of 297, total bilirubin 1.6, GFR 60, WBCs of 12, hemoglobin 10.8, high-sensitivity troponin 14, 16, and 9.  CT of the head and C-spine as well as x-ray left hip, left tib-fib, and left ankle ordered due to tenderness along his extremities.  CTs were unremarkable.  X-ray of the left ankle showed bilateral malleolus fracture, minimal displacement.  Orthopedics was consulted for questionable surgery.  Orthopedics did not think she needed to have surgery as there was good alignment would like to realign with splint.  A posterior splint was placed and the ankle was now at near neutral dorsiflexion.  She was to be mobilized with physical therapy with nonweightbearing of the left foot using a walker for balance and support.  Hospitalization was complicated starting on 09/06/2022 with hypotension with manual blood pressure 78 systolic.  She was ordered midodrine and pain medications were held and discontinued.  Given 1 L normal saline bolus blood pressure improved slightly.  With limited improvement after treatment she was transferred to stepdown for closer monitoring in case pressors were needed on 09/07/2022.  IV team was called to place IV on the evening at 09/07/2022 for the initiation of Levophed due to continued hypotension.  Levophed was discontinued on 09/08/2022 with systolic blood pressure greater than 100.  She did not require pressors overnight.  PCCM signed off on 09/08/2022.  Hospitalization was further complicated  on 09/09/2022 when she went into A-fib with RVR while she was working with PT. She was treated for PSVT with adenosine 6 mg, then 12 mg, then another 12 mg IVP and did not convert.  Heart rate remained upwards of 180s.  She was given diltiazem 10 mg and started on a diltiazem drip.  Previously had been on apixaban that had somehow fallen off of her medication list.  She was started on a heparin infusion, echocardiogram was ordered and pending.  Cardiology was consulted to assist with management of atrial fibrillation RVR.   Past Medical History:  Diagnosis Date   Allergy    Anxiety    Asthma    Chronic pain syndrome    discharged from pain clinic, hx of narcotics seeking behavior   COPD (chronic obstructive pulmonary disease) (HCC)    CVA (cerebral infarction)    Depression    Fibromyalgia    Headache    Hyperlipidemia    Hypertension    IBS (irritable bowel syndrome)    Stroke (HCC)    Vitamin D deficiency     Past Surgical History:  Procedure Laterality Date   ABDOMINAL HYSTERECTOMY     APPENDECTOMY  BREAST EXCISIONAL BIOPSY  2011   Pt states lump removed, ? side, no scar seen   BREAST SURGERY  2011   biopsy   CERVICAL DISCECTOMY     CHOLECYSTECTOMY     SINUSOTOMY       Home Medications:  Prior to Admission medications   Medication Sig Start Date End Date Taking? Authorizing Provider  acetaminophen (TYLENOL) 500 MG tablet Take 500 mg by mouth 2 (two) times daily as needed.   Yes [provider]  albuterol (VENTOLIN HFA) 108 (90 Base) MCG/ACT inhaler Inhale 1-2 puffs into the lungs every 6 (six) hours as needed for wheezing or shortness of breath. 02/06/22  Yes Karamalegos, Netta Neat, DO  apixaban (ELIQUIS) 5 MG TABS tablet Take 1 tablet (5 mg total) by mouth 2 (two) times daily. 08/30/22 09/29/22 Yes Phineas Semen, MD  atorvastatin (LIPITOR) 40 MG tablet TAKE 1 TABLET BY MOUTH EVERY DAY Patient taking differently: Take 40 mg by mouth at bedtime. TAKE 1 TABLET  BY MOUTH EVERY DAY 12/26/21  Yes Karamalegos, Netta Neat, DO  clobetasol ointment (TEMOVATE) 0.05 % Apply 1 Application topically daily.   Yes [provider]  FLUoxetine (PROZAC) 20 MG capsule Take 1 capsule (20 mg total) by mouth daily. 08/28/22  Yes Loyce Dys, MD  furosemide (LASIX) 20 MG tablet Take 2 tablets (40 mg total) by mouth daily. If swelling is less, can take only 1 pill. 02/21/21  Yes Karamalegos, Netta Neat, DO  gabapentin (NEURONTIN) 300 MG capsule Take 900 mg by mouth 3 (three) times daily. Take with one 600 mg tablet for total 900 mg three times daily 07/01/22  Yes [provider]  Melatonin 3 MG CAPS Take 10 mg by mouth at bedtime as needed (sleep).    Yes [provider]  metoprolol succinate (TOPROL XL) 25 MG 24 hr tablet Take 1 tablet (25 mg total) by mouth daily. 08/30/22 08/30/23 Yes Phineas Semen, MD  montelukast (SINGULAIR) 10 MG tablet TAKE 1 TABLET(10 MG) BY MOUTH AT BEDTIME Patient taking differently: Take 10 mg by mouth at bedtime. 03/13/22  Yes Karamalegos, Netta Neat, DO  potassium chloride (KLOR-CON) 10 MEQ tablet TAKE 1 TABLET(10 MEQ) BY MOUTH DAILY Patient taking differently: Take 10 mEq by mouth daily. 05/13/22  Yes Karamalegos, Netta Neat, DO  QUEtiapine (SEROQUEL) 50 MG tablet Take 1 tablet (50 mg total) by mouth at bedtime. Take total of 250 mg at night. Take along with 200 mg tab 09/04/22 11/03/22 Yes Hisada, Barbee Cough, MD  Harrel Carina ELLIPTA 200-62.5-25 MCG/ACT AEPB INHALE 1 PUFF INTO THE LUNGS DAILY 08/14/22  Yes Salena Saner, MD  aspirin 81 MG chewable tablet Chew 81 mg by mouth daily. Patient not taking: Reported on 09/05/2022    [provider]  methocarbamol (ROBAXIN) 500 MG tablet Take 1 tablet (500 mg total) by mouth every 8 (eight) hours as needed for muscle spasms. Patient not taking: Reported on 09/05/2022 04/24/22   Smitty Cords, DO  nitrofurantoin, macrocrystal-monohydrate, (MACROBID) 100 MG capsule Take 100  mg by mouth 2 (two) times daily. Patient not taking: Reported on 09/05/2022    [provider]  ondansetron (ZOFRAN-ODT) 4 MG disintegrating tablet Take 1 tablet (4 mg total) by mouth every 8 (eight) hours as needed for nausea or vomiting. Patient not taking: Reported on 09/05/2022 04/05/22   Smitty Cords, DO  traMADol (ULTRAM) 50 MG tablet Take 1 tablet (50 mg total) by mouth daily as needed. Patient not taking: Reported on 09/05/2022  09/04/22 10/04/22  Lorre Munroe, NP    Inpatient Medications: Scheduled Meds:  Chlorhexidine Gluconate Cloth  6 each Topical Q0600   FLUoxetine  20 mg Oral Daily   fluticasone furoate-vilanterol  1 puff Inhalation Daily   And   umeclidinium bromide  1 puff Inhalation Daily   gabapentin  100 mg Oral TID   metoprolol tartrate  12.5 mg Oral BID   midodrine  10 mg Oral TID WC   montelukast  10 mg Oral QHS   naproxen  500 mg Oral BID WC   Continuous Infusions:  sodium chloride Stopped (09/09/22 0940)   cefTRIAXone (ROCEPHIN)  IV Stopped (09/08/22 1645)   diltiazem (CARDIZEM) infusion 15 mg/hr (09/09/22 1050)   heparin 950 Units/hr (09/09/22 1129)   lactated ringers 75 mL/hr at 09/09/22 1050   PRN Meds: albuterol, ketorolac, melatonin, morphine injection, ondansetron **OR** ondansetron (ZOFRAN) IV, oxyCODONE  Allergies:    Allergies  Allergen Reactions   Bextra  [Valdecoxib]    Compazine [Prochlorperazine Edisylate]     Stroke-like symptoms   Lithium Carbonate     Leg weakness   Lyrica [Pregabalin]     Social History:   Social History   Socioeconomic History   Marital status: Legally Separated    Spouse name: Not on file   Number of children: 2   Years of education: Not on file   Highest education level: Not on file  Occupational History   Occupation: retired  Tobacco Use   Smoking status: Some Days    Current packs/day: 0.00    Average packs/day: 0.3 packs/day for 57.0 years (14.3 ttl pk-yrs)    Types: Cigarettes     Start date: 10/25/1963    Last attempt to quit: 10/24/2020    Years since quitting: 1.8   Smokeless tobacco: Never   Tobacco comments:    16 cigarettes a week. Khj 07/26/2022  Vaping Use   Vaping status: Never Used  Substance and Sexual Activity   Alcohol use: No    Alcohol/week: 0.0 standard drinks of alcohol   Drug use: No   Sexual activity: Not Currently  Other Topics Concern   Not on file  Social History Narrative   Not on file   Social Determinants of Health   Financial Resource Strain: Low Risk  (02/01/2022)   Overall Financial Resource Strain (CARDIA)    Difficulty of Paying Living Expenses: Not hard at all  Food Insecurity: No Food Insecurity (09/05/2022)   Hunger Vital Sign    Worried About Running Out of Food in the Last Year: Never true    Ran Out of Food in the Last Year: Never true  Transportation Needs: No Transportation Needs (09/05/2022)   PRAPARE - Administrator, Civil Service (Medical): No    Lack of Transportation (Non-Medical): No  Physical Activity: Insufficiently Active (02/01/2022)   Exercise Vital Sign    Days of Exercise per Week: 7 days    Minutes of Exercise per Session: 20 min  Stress: No Stress Concern Present (02/01/2022)   [employer] of Occupational Health - Occupational Stress Questionnaire    Feeling of Stress : Only a little  Social Connections: Socially Isolated (02/01/2022)   Social Connection and Isolation Panel [NHANES]    Frequency of Communication with Friends and Family: Twice a week    Frequency of Social Gatherings with Friends and Family: Once a week    Attends Religious Services: Never    Database administrator or Organizations: No  Attends Banker Meetings: Never    Marital Status: Separated  Intimate Partner Violence: Not At Risk (09/05/2022)   Humiliation, Afraid, Rape, and Kick questionnaire    Fear of Current or Ex-Partner: No    Emotionally Abused: No    Physically Abused: No    Sexually Abused: No     Family History:    Family History  Problem Relation Age of Onset   Anxiety disorder Mother    Depression Mother    Breast cancer Mother 10   Cancer Father    Gallbladder disease Father    Alcohol abuse Father    Depression Father    Bipolar disorder Son      ROS:  Please see the history of present illness.  Review of Systems  Constitutional:  Positive for malaise/fatigue.  Respiratory:  Positive for shortness of breath.   Cardiovascular:  Positive for palpitations and leg swelling.  Musculoskeletal:  Positive for falls and joint pain.  Neurological:  Positive for weakness.    All other ROS reviewed and negative.     Physical Exam/Data:   Vitals:   09/09/22 0930 09/09/22 0945 09/09/22 1000 09/09/22 1100  BP: 138/82 (!) 158/92 126/64 (!) 131/90  Pulse: 85 (!) 151 64 (!) 145  Resp: 18 (!) 21 19 16   Temp:      TempSrc:      SpO2: 95% 92% 92% 94%  Weight:      Height:        Intake/Output Summary (Last 24 hours) at 09/09/2022 1240 Last data filed at 09/09/2022 1100 Gross per 24 hour  Intake 2398.72 ml  Output 625 ml  Net 1773.72 ml      09/07/2022    4:59 PM 09/05/2022   12:49 PM 08/30/2022    3:29 PM  Last 3 Weights  Weight (lbs) 144 lb 2.9 oz 133 lb 140 lb  Weight (kg) 65.4 kg 60.328 kg 63.504 kg     Body mass index is 23.99 kg/m.  General: Frail chronically ill-appearing HEENT: normal Neck: no JVD Vascular: No carotid bruits; Distal pulses 2+ bilaterally Cardiac:  normal S1, S2; IR IR; I/VI SEM Lungs:  clear with diminished bases to auscultation bilaterally, no wheezing, rhonchi or rales, respirations are unlabored at rest on room air Abd: soft, nontender, no hepatomegaly  Ext:  1+edema Musculoskeletal:  No deformities, BUE and BLE strength normal and equal Skin: warm and dry  Neuro:  CNs 2-12 intact, no focal abnormalities noted Psych:  Normal affect   EKG:  The EKG was personally reviewed and demonstrates: Atrial fibrillation with RVR rate of  190 Telemetry:  Telemetry was personally reviewed and demonstrates: Sinus rhythm then converted into atrial fibrillation RVR with rates of 150s 160s  Relevant CV Studies: TTE 08/26/22 1. Left ventricular ejection fraction, by estimation, is 55 to 60%. The  left ventricle has normal function. The left ventricle has no regional  wall motion abnormalities. There is mild left ventricular hypertrophy.  Left ventricular diastolic parameters  are indeterminate.   2. Right ventricular systolic function is normal. The right ventricular  size is normal. There is severely elevated pulmonary artery systolic  pressure.   3. Left atrial size was mild to moderately dilated.   4. Right atrial size was mildly dilated.   5. The mitral valve is normal in structure. Mild mitral valve  regurgitation. No evidence of mitral stenosis.   6. Tricuspid valve regurgitation is moderate.   7. The aortic valve is normal in  structure. Aortic valve regurgitation is  not visualized. Aortic valve sclerosis/calcification is present, without  any evidence of aortic stenosis.   8. The inferior vena cava is normal in size with <50% respiratory  variability, suggesting right atrial pressure of 8 mmHg.   TTE with bubble study 03/24/22 1. Left ventricular ejection fraction, by estimation, is 60 to 65%. The  left ventricle has normal function. The left ventricle has no regional  wall motion abnormalities. Left ventricular diastolic parameters were  normal.   2. Right ventricular systolic function is normal. The right ventricular  size is normal.   3. The mitral valve is normal in structure. Mild to moderate mitral valve  regurgitation. No evidence of mitral stenosis.   4. Tricuspid valve regurgitation is moderate to severe.   5. The aortic valve is normal in structure. Aortic valve regurgitation is  mild. No aortic stenosis is present.   6. The inferior vena cava is normal in size with greater than 50%  respiratory  variability, suggesting right atrial pressure of 3 mmHg.   7. Agitated saline contrast bubble study was negative, with no evidence  of any interatrial shunt.    Laboratory Data:  High Sensitivity Troponin:   Recent Labs  Lab 08/30/22 1530 08/30/22 1732 09/05/22 1502 09/05/22 1712 09/07/22 1533  TROPONINIHS 10 10 14 16 9      Chemistry Recent Labs  Lab 09/07/22 0449 09/08/22 0510 09/08/22 0514 09/09/22 0352  NA 140  --  140 144  K 3.2*  --  3.2* 3.7  CL 105  --  109 112*  CO2 29  --  24 26  GLUCOSE 100*  --  92 99  BUN 26*  --  20 16  CREATININE 1.19*  --  0.74 0.79  CALCIUM 8.7*  --  8.5* 8.7*  MG 1.9 1.7  --   --   GFRNONAA 47*  --  >60 >60  ANIONGAP 6  --  7 6    Recent Labs  Lab 09/06/22 0610 09/07/22 0449 09/09/22 0352  PROT 6.2* 5.6* 5.4*  ALBUMIN 2.8* 2.3* 2.4*  AST 201* 93* 35  ALT 180* 118* 64*  ALKPHOS 243* 168* 129*  BILITOT 1.3* 0.6 0.8   Lipids No results for input(s): "CHOL", "TRIG", "HDL", "LABVLDL", "LDLCALC", "CHOLHDL" in the last 168 hours.  Hematology Recent Labs  Lab 09/06/22 0610 09/07/22 0449 09/07/22 1349 09/09/22 0352  WBC 10.1 8.9  --  8.9  RBC 3.00* 2.80*  --  2.70*  HGB 9.4* 8.8* 9.3* 8.5*  HCT 28.6* 26.9* 28.6* 26.1*  MCV 95.3 96.1  --  96.7  MCH 31.3 31.4  --  31.5  MCHC 32.9 32.7  --  32.6  RDW 14.4 14.4  --  14.4  PLT 307 289  --  304   Thyroid No results for input(s): "TSH", "FREET4" in the last 168 hours.  BNPNo results for input(s): "BNP", "PROBNP" in the last 168 hours.  DDimer No results for input(s): "DDIMER" in the last 168 hours.   Radiology/Studies:  CT Lumbar Spine Wo Contrast  Result Date: 09/05/2022 CLINICAL DATA:  Back trauma.  Fall. EXAM: CT LUMBAR SPINE WITHOUT CONTRAST TECHNIQUE: Multidetector CT imaging of the lumbar spine was performed without intravenous contrast administration. Multiplanar CT image reconstructions were also generated. RADIATION DOSE REDUCTION: This exam was performed according to  the departmental dose-optimization program which includes automated exposure control, adjustment of the mA and/or kV according to patient size and/or use of iterative reconstruction technique.  COMPARISON:  Lumbar spine radiographs 08/12/2022. FINDINGS: Segmentation: Conventional numbering is assumed with 5 non-rib-bearing, lumbar type vertebral bodies. Alignment: Normal. Vertebrae: Unchanged chronic compression deformity of the T12 vertebral body with moderate anterior height loss. No new fracture. Multilevel degenerative endplate changes. Paraspinal and other soft tissues: Atherosclerotic calcifications of the abdominal aorta and its branches. Disc levels: Multilevel lumbar spondylosis, worst at L4-5, where there is at least moderate spinal canal stenosis. IMPRESSION: 1. No acute fracture or traumatic malalignment of the lumbar spine. 2. Unchanged chronic compression deformity of the T12 vertebral body with moderate anterior height loss. 3. Multilevel lumbar spondylosis, worst at L4-5, where there is at least moderate spinal canal stenosis. Aortic Atherosclerosis (ICD10-I70.0). Electronically Signed   By: Orvan Falconer M.D.   On: 09/05/2022 14:48   CT HEAD WO CONTRAST ( )  Result Date: 09/05/2022 CLINICAL DATA:  Head trauma, minor (Age >= 65y); Neck trauma (Age >= 65y). Fall. EXAM: CT HEAD WITHOUT CONTRAST CT CERVICAL SPINE WITHOUT CONTRAST TECHNIQUE: Multidetector CT imaging of the head and cervical spine was performed following the standard protocol without intravenous contrast. Multiplanar CT image reconstructions of the cervical spine were also generated. RADIATION DOSE REDUCTION: This exam was performed according to the departmental dose-optimization program which includes automated exposure control, adjustment of the mA and/or kV according to patient size and/or use of iterative reconstruction technique. COMPARISON:  Head CT and brain MRI 03/24/2022. MRI cervical spine 03/24/2022. FINDINGS: CT HEAD  FINDINGS Brain: Within limits of motion artifact, no acute hemorrhage. Unchanged mild chronic small-vessel disease with old lacunar infarct in the right lentiform nucleus. No hydrocephalus or extra-axial collection. No mass effect or midline shift. Vascular: No hyperdense vessel or unexpected calcification. Skull: No calvarial fracture or suspicious bone lesion. Skull base is unremarkable. Sinuses/Orbits: No acute finding. Other: None. CT CERVICAL SPINE FINDINGS Alignment: Normal. Skull base and vertebrae: Prior C5-C7 ACDF with solid bony fusion across the intervening disc spaces. Hardware is intact. No associated lucency. No acute fracture. Craniocervical junction is intact. Soft tissues and spinal canal: No prevertebral fluid or swelling. No visible canal hematoma. Disc levels: Mild cervical spondylosis without high-grade spinal canal stenosis. Upper chest: Emphysema in the lung apices. Other: None. IMPRESSION: 1. No acute intracranial abnormality. 2. No acute cervical spine fracture or traumatic listhesis. 3. Prior C5-C7 ACDF with solid bony fusion across the intervening disc spaces. Electronically Signed   By: Orvan Falconer M.D.   On: 09/05/2022 14:32   CT Cervical Spine Wo Contrast  Result Date: 09/05/2022 CLINICAL DATA:  Head trauma, minor (Age >= 65y); Neck trauma (Age >= 65y). Fall. EXAM: CT HEAD WITHOUT CONTRAST CT CERVICAL SPINE WITHOUT CONTRAST TECHNIQUE: Multidetector CT imaging of the head and cervical spine was performed following the standard protocol without intravenous contrast. Multiplanar CT image reconstructions of the cervical spine were also generated. RADIATION DOSE REDUCTION: This exam was performed according to the departmental dose-optimization program which includes automated exposure control, adjustment of the mA and/or kV according to patient size and/or use of iterative reconstruction technique. COMPARISON:  Head CT and brain MRI 03/24/2022. MRI cervical spine 03/24/2022. FINDINGS:  CT HEAD FINDINGS Brain: Within limits of motion artifact, no acute hemorrhage. Unchanged mild chronic small-vessel disease with old lacunar infarct in the right lentiform nucleus. No hydrocephalus or extra-axial collection. No mass effect or midline shift. Vascular: No hyperdense vessel or unexpected calcification. Skull: No calvarial fracture or suspicious bone lesion. Skull base is unremarkable. Sinuses/Orbits: No acute finding. Other: None. CT CERVICAL SPINE FINDINGS  Alignment: Normal. Skull base and vertebrae: Prior C5-C7 ACDF with solid bony fusion across the intervening disc spaces. Hardware is intact. No associated lucency. No acute fracture. Craniocervical junction is intact. Soft tissues and spinal canal: No prevertebral fluid or swelling. No visible canal hematoma. Disc levels: Mild cervical spondylosis without high-grade spinal canal stenosis. Upper chest: Emphysema in the lung apices. Other: None. IMPRESSION: 1. No acute intracranial abnormality. 2. No acute cervical spine fracture or traumatic listhesis. 3. Prior C5-C7 ACDF with solid bony fusion across the intervening disc spaces. Electronically Signed   By: Orvan Falconer M.D.   On: 09/05/2022 14:32   DG Tibia/Fibula Left  Result Date: 09/05/2022 CLINICAL DATA:  pain injury EXAM: LEFT TIBIA AND FIBULA - 2 VIEW COMPARISON:  Ankle radiograph from earlier the same day. FINDINGS: There is acute minimally displaced oblique fracture of the later malleolus. Acute fracture of the medial malleolus was better seen on the prior ankle radiograph. No other acute fracture or dislocation. No aggressive osseous lesion. The knee joint appears within normal limits. No significant arthritis. Ankle mortise appears intact. No focal soft tissue swelling. No radiopaque foreign bodies. IMPRESSION: Acute minimally displaced fracture of the lateral malleolus. Electronically Signed   By: Jules Schick M.D.   On: 09/05/2022 14:18   DG Hip Unilat W or Wo Pelvis 2-3 Views  Left  Result Date: 09/05/2022 CLINICAL DATA:  pain injury EXAM: DG HIP (WITH OR WITHOUT PELVIS) 2-3V LEFT COMPARISON:  None Available. FINDINGS: Pelvis is intact with normal and symmetric sacroiliac joints. No acute fracture or dislocation. No aggressive osseous lesion. Visualized sacral arcuate lines are unremarkable. Unremarkable symphysis pubis. There are mild degenerative changes of bilateral hip joints without significant joint space narrowing. Osteophytosis of the superior acetabulum. No radiopaque foreign bodies. IMPRESSION: *No acute fracture or dislocation. Mild degenerative changes of the hip joints. Electronically Signed   By: Jules Schick M.D.   On: 09/05/2022 14:14   DG Ankle Complete Left  Result Date: 09/05/2022 CLINICAL DATA:  fall EXAM: LEFT ANKLE COMPLETE - 3+ VIEW COMPARISON:  None Available. FINDINGS: Acute minimally displaced transsyndesmotic distal fibular fracture. Acute displaced medial malleolar avulsion fracture. Slight cortical irregularity of the posterior malleolus suggestive of a nondisplaced fracture. There is no evidence of severe arthropathy or other focal bone abnormality. Lateral greater than medial subcutaneus soft tissue edema. IMPRESSION: 1. Acute minimally displaced transsyndesmotic distal fibular fracture. 2. Acute displaced medial malleolar avulsion fracture. 3. Slight cortical irregularity of the posterior malleolus suggestive of a nondisplaced fracture. Electronically Signed   By: Tish Frederickson M.D.   On: 09/05/2022 13:48     Assessment and Plan:   Atrial fibrillation RVR with a history of PAF -previously had presented to the [facility] ED with a-fib RVR converted with diltiazem IVP and started on Eliquis and beta blocker therapy was increased -went back into A-fib RVR this morning, given adenosine x 3 doses with no response for questionable SVT -started on diltiazem drip -started on heparin infusion per protocol -will need to be transitioned back to Eliquis  prior to discharge for a CHA2DS2-VASc Score = 8 for stroke prophylaxis -Previously placed on hold due to anemia -repeat echocardiogram ordered and pending with further recommendations to follow -restarted metoprolol 12.5 mg twice daily -continue with telemetry monitoring -ordered TSH -recommend keeping serum potassium greater than 4 less than 5 and Mg 2 -daily bmp  Hypertension with episodes of hypotension -blood pressure 131/90 -continue on metoprolol and diltiazem -wean midodrine as able -vital signs  per unit protocol  Amenia -Hgb 8.5 -Hgb on arrival 10.8 -recommend FOBT -daily cbc -recommend keeping hgb greater than equal to 8  Hyperlipidemia -statin therapy on hold due to elevated LFT's  Pulmonary Hypertension -noted on previous echocardiogram -repeat echo completed today -wears 3L O2 at baseline -follows with pulmonary as an outpatient  Chronic pain syndrome -Has been on several medications -PTA medications are on hold -Continue management per IM  Acute left lateral malleolus fracture s/p mechanical fall -Seen by orthopedics -Ankle splinted -Continue with mobility by physical therapy -Management per IM and orthopedics  UTI -Currently on antibiotics -Urine culture pending -Continue management per IM  COPD/Asthma -Not in exacerbation -Continue on home oxygen regimen -Continue on as needed albuterol -Continue home Breo plus Incruse and Singulair  Risk Assessment/Risk Scores:        New York Heart Association (NYHA) Functional Class NYHA Class II  CHA2DS2-VASc Score = 8   This indicates a 10.8% annual risk of stroke. The patient's score is based upon: CHF History: 1 HTN History: 1 Diabetes History: 0 Stroke History: 2 Vascular Disease History: 1 Age Score: 2 Gender Score: 1         For questions or updates, please contact [facility] Please consult [email or web address] for contact info under    Signed,  , NP  09/09/2022  12:40 PM

## 2022-09-09 NOTE — Consult Note (Signed)
ANTICOAGULATION CONSULT NOTE - Initial Consult  Pharmacy Consult for Heparin Infusion Indication: atrial fibrillation  Allergies  Allergen Reactions   Bextra  [Valdecoxib]    Compazine [Prochlorperazine Edisylate]     Stroke-like symptoms   Lithium Carbonate     Leg weakness   Lyrica [Pregabalin]     Patient Measurements: Height: 5\' 5"  (165.1 cm) Weight: 65.4 kg (144 lb 2.9 oz) IBW/kg (Calculated) : 57 Heparin Dosing Weight: 65.4 kg   Vital Signs: Temp: 97.5 F (36.4 C) (08/12 1600) Temp Source: Oral (08/12 1600) BP: 119/59 (08/12 1800) Pulse Rate: 66 (08/12 1800)  Labs: Recent Labs    09/07/22 0449 09/07/22 1349 09/07/22 1533 09/08/22 0514 09/09/22 0352 09/09/22 1046  HGB 8.8* 9.3*  --   --  8.5*  --   HCT 26.9* 28.6*  --   --  26.1*  --   PLT 289  --   --   --  304  --   APTT  --   --   --   --   --  31  LABPROT  --   --   --   --   --  17.0*  INR  --   --   --   --   --  1.4*  HEPARINUNFRC  --   --   --   --   --  >1.10*  CREATININE 1.19*  --   --  0.74 0.79  --   TROPONINIHS  --   --  9  --   --   --     Estimated Creatinine Clearance: 52.2 mL/min (by C-G formula based on SCr of 0.79 mg/dL).   Medical History: Past Medical History:  Diagnosis Date   Allergy    Anxiety    Asthma    Chronic pain syndrome    discharged from pain clinic, hx of narcotics seeking behavior   COPD (chronic obstructive pulmonary disease) (HCC)    CVA (cerebral infarction)    Depression    Fibromyalgia    Headache    Hyperlipidemia    Hypertension    IBS (irritable bowel syndrome)    Stroke (HCC)    Vitamin D deficiency     Assessment: Beth [name] is a 77 y.o. female presenting after fall. PMH significant for depression, anxiety, neuropathy, AF (on apixaban), insomnia. Patient was on [facility] PTA per chart review. Apixaban was continued on admission and last dose was 8/9 @ 2157. Patient has has runs of SVT and AF while admitted with HR up to 180s. CHADS-VASc at least  7. Pharmacy has been consulted to initiate and manage heparin infusion.   Baseline Labs: aPTT 31, HL >1.10, PT 17.0, INR 1.4, Hgb 8.5, Hct 26.1, Plt 304   Goal of Therapy:  Heparin level 0.3-0.7 units/ml aPTT 66-102 seconds Monitor platelets by anticoagulation protocol: Yes  Date Time aPTT/HL Rate/Comment 0812 2005 87 sec  Therapeutic x 1     PLAN: Continue heparin infusion at 950 units/hour. Check HL and/or aPTT in 8 hours. Continue to titrate by aPTT until heparin level and aPTT correlate and/or apixaban washes out, then titrate by heparin level alone. Check heparin level with next AM labs. Continue to monitor CBC daily while on heparin infusion.  Will M. Dareen Piano, PharmD Clinical Pharmacist 09/09/2022 8:29 PM

## 2022-09-09 NOTE — Consult Note (Signed)
ANTICOAGULATION CONSULT NOTE - Initial Consult  Pharmacy Consult for Heparin Infusion Indication: atrial fibrillation  Allergies  Allergen Reactions   Bextra  [Valdecoxib]    Compazine [Prochlorperazine Edisylate]     Stroke-like symptoms   Lithium Carbonate     Leg weakness   Lyrica [Pregabalin]     Patient Measurements: Height: 5\' 5"  (165.1 cm) Weight: 65.4 kg (144 lb 2.9 oz) IBW/kg (Calculated) : 57 Heparin Dosing Weight: 65.4 kg   Vital Signs: Temp: 97.7 F (36.5 C) (08/12 0800) Temp Source: Oral (08/12 0800) BP: 126/64 (08/12 1000) Pulse Rate: 64 (08/12 1000)  Labs: Recent Labs    09/07/22 0449 09/07/22 1349 09/07/22 1533 09/08/22 0514 09/09/22 0352  HGB 8.8* 9.3*  --   --  8.5*  HCT 26.9* 28.6*  --   --  26.1*  PLT 289  --   --   --  304  CREATININE 1.19*  --   --  0.74 0.79  TROPONINIHS  --   --  9  --   --     Estimated Creatinine Clearance: 52.2 mL/min (by C-G formula based on SCr of 0.79 mg/dL).   Medical History: Past Medical History:  Diagnosis Date   Allergy    Anxiety    Asthma    Chronic pain syndrome    discharged from pain clinic, hx of narcotics seeking behavior   COPD (chronic obstructive pulmonary disease) (HCC)    CVA (cerebral infarction)    Depression    Fibromyalgia    Headache    Hyperlipidemia    Hypertension    IBS (irritable bowel syndrome)    Stroke (HCC)    Vitamin D deficiency     Assessment: Beth [name] is a 77 y.o. female presenting after fall. PMH significant for depression, anxiety, neuropathy, AF (on apixaban), insomnia. Patient was on [facility] PTA per chart review. Apixaban was continued on admission and last dose was 8/9 @ 2157. Patient has has runs of SVT and AF while admitted with HR up to 180s. CHADS-VASc at least 7. Pharmacy has been consulted to initiate and manage heparin infusion.   Baseline Labs: aPTT 31, HL >1.10, PT 17.0, INR 1.4, Hgb 8.5, Hct 26.1, Plt 304   Goal of Therapy:  Heparin level 0.3-0.7  units/ml aPTT 66-102 seconds Monitor platelets by anticoagulation protocol: Yes   Plan:  Give 3250 units bolus x 1 as last dose of apixaban was >24h ago Start heparin infusion at 950 units/hr Check aPTT in 8 hours Continue to monitor aPTT until HL and aPTT correlate.  Check HL daily for correlation until HL and aPTT correlate. Switch to HL monitoring once HL and aPTT correlate.  Continue to monitor H&H and platelets daily while on heparin infusion    Celene Squibb, PharmD Clinical Pharmacist 09/09/2022 10:56 AM

## 2022-09-09 NOTE — Progress Notes (Signed)
*  PRELIMINARY RESULTS* Echocardiogram 2D Echocardiogram has been performed.  Cristela Blue 09/09/2022, 11:11 AM

## 2022-09-09 NOTE — Progress Notes (Addendum)
Critical care note: Called by RN for tachycardia concerning for SVT  Pt was getting up to work w/ PT then tachycardic. Pt noted palpitations, no dizziness. Pt is alert and talking.   BB has been on hold d/t hypotension  Eliquis has been on hold d/t postop anemia   SVT confirmed on EKG/telemetry  BP stable   HR 180s-100s Adenosine 6 mg, then 12 mg, then another 12 mg pushes did not convert, HR remained 180s-200s  Diltiazem 10 mg slowed HR, looks c/w Afib RVR, HR in 130s Started dilt gtt   EKG to confirm rhythm Echo ordered Continue dilt gtt to titrate Heparin gtt for now  Cardiology consult placed  Pt confirms full code   Son [name] was updated by phone per pt request   Critical care time spent 45 min

## 2022-09-09 NOTE — Progress Notes (Signed)
PT Cancellation Note  Patient Details Name: Beth [name] MRN: [id number] DOB: Jul 30, 1945   Cancelled Treatment:    Reason Eval/Treat Not Completed: Medical issues which prohibited therapy.  PT consult received.  Chart reviewed.  Nurse cleared pt for therapy participation.  While therapist was setting room up (for OOB mobility) pt's HR suddenly increased to 140's to 170's (fluctuating) with pt resting in bed.  Nurse arrived and notified MD.  Will hold therapy at this time and re-attempt PT re-evaluation at a later date/time.  Hendricks Limes, PT 09/09/22, 9:33 AM

## 2022-09-09 NOTE — TOC Progression Note (Addendum)
Transition of Care [facility]) - Progression Note    Patient Details  Name: Beth [name] MRN: [id number] Date of Birth: 06-29-1945  [facility] CM/SW Contact  Kreg Shropshire, RN Phone Number: 09/09/2022, 12:10 PM  Clinical Narrative:     Pt worked with PT and agreed to go to [facility]. Pt stated she lives with her daughter. She does not have any preference for STR besides a facility in [place] that she could not recall. Cm will started bed search and completed FL2.  Expected Discharge Plan: Skilled Nursing Facility Barriers to Discharge: Continued Medical Work up  Expected Discharge Plan and Services   Discharge Planning Services: CM Consult Post Acute Care Choice: Home Health, Skilled Nursing Facility Living arrangements for the past 2 months: Apartment                   DME Agency: NA                   Social Determinants of Health (SDOH) Interventions SDOH Screenings   Food Insecurity: No Food Insecurity (09/05/2022)  Housing: Low Risk  (09/05/2022)  Transportation Needs: No Transportation Needs (09/05/2022)  Utilities: Not At Risk (09/05/2022)  Alcohol Screen: Low Risk  (02/01/2022)  Depression (PHQ2-9): High Risk (05/13/2022)  Financial Resource Strain: Low Risk  (02/01/2022)  Physical Activity: Insufficiently Active (02/01/2022)  Social Connections: Socially Isolated (02/01/2022)  Stress: No Stress Concern Present (02/01/2022)  Tobacco Use: High Risk (09/05/2022)    Readmission Risk Interventions    09/09/2022   12:06 PM  Readmission Risk Prevention Plan  Transportation Screening Complete  Medication Review (RN Care Manager) Referral to Pharmacy  PCP or Specialist appointment within 3-5 days of discharge Complete  HRI or Home Care Consult Complete  SW Recovery Care/Counseling Consult Complete  Palliative Care Screening Complete  Skilled Nursing Facility Complete

## 2022-09-09 NOTE — Progress Notes (Signed)
Stepped in pts room to deliver chair alarm to PT and pts HR is in the 140-180's afib w/rvr. Pt resting in bed, no cp, per patient she has been very anxious and not able to sleep much the past two nights. BP 138/82, RR 18, 95% on 2L N/C. Per patient feels like her heart is "racing" Dr. Lyn Hollingshead notified. Per MD will come assess pt.

## 2022-09-09 NOTE — NC FL2 (Signed)
[place] MEDICAID FL2 LEVEL OF CARE FORM     IDENTIFICATION  Patient Name: Beth [name] Birthdate: 1945-06-19 Sex: female Admission Date (Current Location): 09/05/2022  [place] and [place] Number:  Chiropodist and Address:  [facility], [address], [place], [place] [address]      Provider Number: [id number]  Attending Physician Name and Address:  Sunnie Nielsen, DO  Relative Name and Phone Number:  Geneieve Espejel (son) [phone]    Current Level of Care: Hospital Recommended Level of Care: Skilled Nursing Facility Prior Approval Number:    Date Approved/Denied:   PASRR Number: [id number]  Discharge Plan: SNF    Current Diagnoses: Patient Active Problem List   Diagnosis Date Noted   Inadequate pain control 09/05/2022   Closed fracture of left lateral malleolus 09/05/2022   Atrial fibrillation, chronic (HCC) 09/05/2022   Acute on chronic diastolic CHF (congestive heart failure) (HCC) 08/25/2022   Stroke (HCC) 08/25/2022   HTN (hypertension) 08/25/2022   HLD (hyperlipidemia) 08/25/2022   Chronic kidney disease, stage 3a (HCC) 08/25/2022   Hypokalemia 08/25/2022   Myocardial injury 08/25/2022   Prolonged QT interval 08/25/2022   UTI (urinary tract infection) 08/25/2022   Acute left-sided weakness 03/24/2022   Anxiety and depression 03/24/2022   Asthma with COPD 03/24/2022   TIA (transient ischemic attack) 03/24/2022   Multiple falls 03/24/2022   Severe pulmonary arterial systolic hypertension (HCC) 03/24/2022   Moderate mitral regurgitation 03/24/2022   Moderate tricuspid regurgitation 03/24/2022   Moderate aortic valve stenosis 03/24/2022   Chronic diastolic congestive heart failure (HCC) 11/14/2020   Rash 01/14/2020   Pulmonary hypertension (HCC) 09/14/2019   Coccydynia 07/29/2019   Cervicalgia 05/04/2019   Acute pain of left shoulder 05/04/2019   Elbow pain, left 05/04/2019   DOE (dyspnea on exertion)  03/23/2019   Leg swelling 03/22/2019   Lumbar degenerative disc disease 09/10/2018   Left hip pain 09/10/2018   Post-traumatic osteoarthritis of left elbow 07/14/2018   Lumbar radiculopathy 02/12/2018   Lumbar spondylosis 02/12/2018   Lumbar facet arthropathy 02/12/2018   Atherosclerosis of aorta (HCC) 09/17/2017   Lung nodule 09/17/2017   Urinary tract infection 12/05/2016   Protein-calorie malnutrition, mild (HCC) 08/07/2016   Generalized anxiety disorder 07/15/2016   Chronic pain syndrome 07/15/2016   Severe episode of recurrent major depressive disorder, without psychotic features (HCC) 07/14/2016   Moderate benzodiazepine use disorder (HCC) 05/08/2016   Major depressive disorder, recurrent, severe w/o psychotic behavior (HCC) 05/01/2016   Seasonal allergic rhinitis 03/23/2015   Hyperglycemia 03/23/2015   Senile purpura (HCC) 03/23/2015   Perennial allergic rhinitis with seasonal variation 03/23/2015   Marital problems 10/31/2014   Migraine without aura and without status migrainosus, not intractable 10/31/2014   Myofascial pain syndrome of lumbar spine 08/09/2014   Colon polyp 08/09/2014   COPD, severe (HCC) 08/09/2014   CVA, old, hemiparesis (HCC) 08/09/2014   Dysfunction of eustachian tube 08/09/2014   Fibromyalgia syndrome 08/09/2014   Gastro-esophageal reflux disease without esophagitis 08/09/2014   Benign migrating glossitis 08/09/2014   Cerebrovascular accident, old 08/09/2014   IBS (irritable bowel syndrome) 08/09/2014   Low back pain with radiation 08/09/2014   Chronic recurrent major depressive disorder (HCC) 08/09/2014   Dysmetabolic syndrome 08/09/2014   OP (osteoporosis) 08/09/2014   Vitamin D deficiency 08/09/2014   Smoking 08/09/2014   Benign hypertension 07/19/2013   Benign neoplasm of skin of trunk 06/03/2013   H/O: pneumonia 09/25/2012    Orientation RESPIRATION BLADDER Height & Weight  Self, Time, Situation  O2 Indwelling catheter Weight: 65.4  kg Height:  5\' 5"  (165.1 cm)  BEHAVIORAL SYMPTOMS/MOOD NEUROLOGICAL BOWEL NUTRITION STATUS      Continent Diet (see d/c summary)  AMBULATORY STATUS COMMUNICATION OF NEEDS Skin   Limited Assist Verbally Normal                       Personal Care Assistance Level of Assistance  Bathing, Dressing, Feeding Bathing Assistance: Limited assistance Feeding assistance: Independent Dressing Assistance: Limited assistance     Functional Limitations Info             SPECIAL CARE FACTORS FREQUENCY                       Contractures Contractures Info: Not present    Additional Factors Info  Code Status Code Status Info: Full             Current Medications (09/09/2022):  This is the current hospital active medication list Current Facility-Administered Medications  Medication Dose Route Frequency Provider Last Rate Last Admin   0.9 %  sodium chloride infusion  250 mL Intravenous Continuous Sunnie Nielsen, DO   Stopped at 09/09/22 0940   albuterol (VENTOLIN HFA) 108 (90 Base) MCG/ACT inhaler 1-2 puff  1-2 puff Inhalation Q6H PRN Cox, Amy N, DO       cefTRIAXone (ROCEPHIN) 1 g in sodium chloride 0.9 % 100 mL IVPB  1 g Intravenous Q24H Sunnie Nielsen, DO   Stopped at 09/08/22 1645   Chlorhexidine Gluconate Cloth 2 % PADS 6 each  6 each Topical Q0600 Sunnie Nielsen, DO   6 each at 09/09/22 0550   diltiazem (CARDIZEM) 125 mg in dextrose 5% 125 mL (1 mg/mL) infusion  5-15 mg/hr Intravenous Titrated Sunnie Nielsen, DO 15 mL/hr at 09/09/22 1050 15 mg/hr at 09/09/22 1050   FLUoxetine (PROZAC) capsule 20 mg  20 mg Oral Daily Cox, Amy N, DO   20 mg at 09/09/22 0851   fluticasone furoate-vilanterol (BREO ELLIPTA) 200-25 MCG/ACT 1 puff  1 puff Inhalation Daily Cox, Amy N, DO   1 puff at 09/09/22 0853   And   umeclidinium bromide (INCRUSE ELLIPTA) 62.5 MCG/ACT 1 puff  1 puff Inhalation Daily Cox, Amy N, DO   1 puff at 09/09/22 0853   gabapentin (NEURONTIN) capsule 100 mg   100 mg Oral TID Earley Brooke, MD   100 mg at 09/09/22 0851   heparin ADULT infusion 100 units/mL (25000 units/212mL)  950 Units/hr Intravenous Continuous CoulterEber Jones, RPH 9.5 mL/hr at 09/09/22 1129 950 Units/hr at 09/09/22 1129   ketorolac (TORADOL) 15 MG/ML injection 15 mg  15 mg Intravenous Q6H PRN Mansy, Jan A, MD   15 mg at 09/08/22 2055   lactated ringers infusion   Intravenous Continuous Sunnie Nielsen, DO 75 mL/hr at 09/09/22 1050 Infusion Verify at 09/09/22 1050   melatonin tablet 2.5 mg  2.5 mg Oral QHS PRN Mansy, Jan A, MD   2.5 mg at 09/09/22 0043   metoprolol tartrate (LOPRESSOR) tablet 12.5 mg  12.5 mg Oral BID Hammock, Lavonna Rua, NP   12.5 mg at 09/09/22 1119   midodrine (PROAMATINE) tablet 10 mg  10 mg Oral TID WC Sunnie Nielsen, DO   10 mg at 09/09/22 1201   montelukast (SINGULAIR) tablet 10 mg  10 mg Oral QHS Cox, Amy N, DO   10 mg at 09/08/22 2149   morphine (PF) 2 MG/ML injection 2  mg  2 mg Intravenous Q4H PRN Mansy, Jan A, MD   2 mg at 09/09/22 0037   naproxen (NAPROSYN) tablet 500 mg  500 mg Oral BID WC Sunnie Nielsen, DO   500 mg at 09/09/22 0852   ondansetron (ZOFRAN) tablet 4 mg  4 mg Oral Q6H PRN Cox, Amy N, DO       Or   ondansetron (ZOFRAN) injection 4 mg  4 mg Intravenous Q6H PRN Cox, Amy N, DO   4 mg at 09/08/22 1308   oxyCODONE (Oxy IR/ROXICODONE) immediate release tablet 5 mg  5 mg Oral Q6H PRN Sunnie Nielsen, DO   5 mg at 09/09/22 4098     Discharge Medications: Please see discharge summary for a list of discharge medications.  Relevant Imaging Results:  Relevant Lab Results:   Additional Information SSN#[id number]  Kreg Shropshire, RN

## 2022-09-09 NOTE — Evaluation (Signed)
Occupational Therapy Evaluation Patient Details Name: Beth [name] MRN: [id number] DOB: [birth year] Today's Date: 09/09/2022   History of Present Illness Pt. is a 77 year old female with history of COPD on 3L of O2 at night, HTN, depression, anxiety, neuropathy, insomnia, atrial fibrillation, on Eliquis, who presents to the emergency department for chief concerns of a fall in the morning.   Clinical Impression   Patient presenting with decreased Ind in self care, balance, functional mobility/transfers, endurance, and safety awareness. Patient reports living in a senior independently living apartment. She is normally mod I with use of AD for mobility. Pt able to roll and reposition in bed with use of B UEs and R LE with  max A. Session limited secondary to HR while supine increasing to 150's with bed  mobility. She agrees that she needs rehab after this admission secondary to new wt bearing precaution and continued deconditioning. Patient will benefit from acute OT to increase overall independence in the areas of ADLs, functional mobility, and safety awareness in order to safely discharge.       If plan is discharge home, recommend the following: A lot of help with walking and/or transfers;A lot of help with bathing/dressing/bathroom;Assistance with cooking/housework;Assist for transportation;Help with stairs or ramp for entrance;Direct supervision/assist for financial management;Direct supervision/assist for medications management    Functional Status Assessment  Patient has had a recent decline in their functional status and demonstrates the ability to make significant improvements in function in a reasonable and predictable amount of time.  Equipment Recommendations  Other (comment) (defer to next venue of care)       Precautions / Restrictions Precautions Precautions: Fall Required Braces or Orthoses: Splint/Cast Splint/Cast: LLE splint/cast Restrictions Weight Bearing Restrictions:  Yes LLE Weight Bearing: Non weight bearing      Mobility Bed Mobility Overal bed mobility: Needs Assistance Bed Mobility: Rolling Rolling: Max assist, Mod assist              Transfers                   General transfer comment: not attempted secondary to elevated HR          ADL either performed or assessed with clinical judgement   ADL Overall ADL's : Needs assistance/impaired                                       General ADL Comments: set up A for self care needs. Pt's HR is too elevated to attempt transfer this session. Bed mobility with mod - max A this session.     Vision Patient Visual Report: No change from baseline              Pertinent Vitals/Pain Pain Assessment Pain Assessment: Faces Faces Pain Scale: Hurts a little bit Pain Location: L ankle Pain Descriptors / Indicators: Sore, Discomfort Pain Intervention(s): Monitored during session, Premedicated before session, Repositioned     Extremity/Trunk Assessment Upper Extremity Assessment Upper Extremity Assessment: Generalized weakness   Lower Extremity Assessment Lower Extremity Assessment: Generalized weakness       Communication Communication Communication: No apparent difficulties   Cognition Arousal: Alert Behavior During Therapy: WFL for tasks assessed/performed Overall Cognitive Status: Within Functional Limits for tasks assessed  General Comments: Pt is pleasant and cooperative                Home Living Family/patient expects to be discharged to:: Other (Comment) (senior apartments) Living Arrangements: Alone Available Help at Discharge: Family;Available PRN/intermittently;Friend(s) Type of Home: Apartment Home Access: Level entry     Home Layout: One level     Bathroom Shower/Tub: Chief Strategy Officer: Standard Bathroom Accessibility: Yes   Home Equipment: Grab bars -  tub/shower;Shower seat - built Charity fundraiser (2 wheels)          Prior Functioning/Environment Prior Level of Function : Independent/Modified Independent;History of Falls (last six months)             Mobility Comments: Mod Ind amb community distances with a rollator, 4 falls in the last 6 months from tripping/LOB ADLs Comments: Ind with ADLs, cares for her small dog        OT Problem List: Decreased strength;Decreased activity tolerance;Impaired balance (sitting and/or standing);Decreased safety awareness;Pain;Decreased knowledge of use of DME or AE;Decreased knowledge of precautions      OT Treatment/Interventions: Therapeutic exercise;Self-care/ADL training;Energy conservation;DME and/or AE instruction;Therapeutic activities;Patient/family education;Balance training    OT Goals(Current goals can be found in the care plan section) Acute Rehab OT Goals Patient Stated Goal: to get stronger OT Goal Formulation: With patient Time For Goal Achievement: 09/23/22 Potential to Achieve Goals: Fair ADL Goals Pt Will Perform Grooming: with contact guard assist;standing Pt Will Perform Lower Body Dressing: with min assist;sit to/from stand Pt Will Transfer to Toilet: with min assist;bedside commode;ambulating Pt Will Perform Toileting - Clothing Manipulation and hygiene: with min assist;sit to/from stand  OT Frequency: Min 1X/week       AM-PAC OT "6 Clicks" Daily Activity     Outcome Measure Help from another person eating meals?: None Help from another person taking care of personal grooming?: A Little Help from another person toileting, which includes using toliet, bedpan, or urinal?: A Lot Help from another person bathing (including washing, rinsing, drying)?: A Lot Help from another person to put on and taking off regular upper body clothing?: A Little Help from another person to put on and taking off regular lower body clothing?: A Lot 6 Click Score: 16   End of Session  Equipment Utilized During Treatment: Oxygen Nurse Communication: Mobility status  Activity Tolerance: Treatment limited secondary to medical complications (Comment) Patient left: in bed;with bed alarm set;with call bell/phone within reach;with nursing/sitter in room  OT Visit Diagnosis: Other abnormalities of gait and mobility (R26.89);Muscle weakness (generalized) (M62.81);Repeated falls (R29.6);History of falling (Z91.81);Pain Pain - Right/Left: Left Pain - part of body: Ankle and joints of foot                Time: 1125-1145 OT Time Calculation (min): 20 min Charges:  OT General Charges $OT Visit: 1 Visit OT Evaluation $OT Eval Moderate Complexity: 1 Mod OT Treatments $Therapeutic Activity: 8-22 mins  Jackquline Denmark, MS, OTR/L , CBIS ascom [phone]  09/09/22, 1:36 PM

## 2022-09-10 ENCOUNTER — Ambulatory Visit: Payer: 59 | Admitting: Psychiatry

## 2022-09-10 DIAGNOSIS — I480 Paroxysmal atrial fibrillation: Secondary | ICD-10-CM

## 2022-09-10 LAB — CBC
HCT: 29.6 % — ABNORMAL LOW (ref 36.0–46.0)
Hemoglobin: 9.3 g/dL — ABNORMAL LOW (ref 12.0–15.0)
MCH: 30.7 pg (ref 26.0–34.0)
MCHC: 31.4 g/dL (ref 30.0–36.0)
MCV: 97.7 fL (ref 80.0–100.0)
Platelets: 290 10*3/uL (ref 150–400)
RBC: 3.03 MIL/uL — ABNORMAL LOW (ref 3.87–5.11)
RDW: 14.6 % (ref 11.5–15.5)
WBC: 10.5 10*3/uL (ref 4.0–10.5)
nRBC: 0 % (ref 0.0–0.2)

## 2022-09-10 LAB — COMPREHENSIVE METABOLIC PANEL
ALT: 55 U/L — ABNORMAL HIGH (ref 0–44)
AST: 27 U/L (ref 15–41)
Albumin: 2.7 g/dL — ABNORMAL LOW (ref 3.5–5.0)
Alkaline Phosphatase: 137 U/L — ABNORMAL HIGH (ref 38–126)
Anion gap: 8 (ref 5–15)
BUN: 13 mg/dL (ref 8–23)
CO2: 26 mmol/L (ref 22–32)
Calcium: 9.2 mg/dL (ref 8.9–10.3)
Chloride: 109 mmol/L (ref 98–111)
Creatinine, Ser: 0.75 mg/dL (ref 0.44–1.00)
GFR, Estimated: >60 mL/min (ref >60)
Glucose, Bld: 117 mg/dL — ABNORMAL HIGH (ref 70–99)
Potassium: 4.1 mmol/L (ref 3.5–5.1)
Sodium: 143 mmol/L (ref 135–145)
Total Bilirubin: 0.5 mg/dL (ref 0.3–1.2)
Total Protein: 6.4 g/dL — ABNORMAL LOW (ref 6.5–8.1)

## 2022-09-10 MED ORDER — DOCUSATE SODIUM 100 MG PO CAPS: 100.0000 mg | ORAL_CAPSULE | Freq: Two times a day (BID) | ORAL | Status: DC | PRN

## 2022-09-10 MED ORDER — OXYCODONE HCL 5 MG PO TABS: 2.5000 mg | ORAL_TABLET | Freq: Four times a day (QID) | ORAL | Status: DC | PRN

## 2022-09-10 MED ORDER — MIDODRINE HCL 5 MG PO TABS
5.0000 mg | ORAL_TABLET | Freq: Three times a day (TID) | ORAL | Status: DC
  Administered 2022-09-10: 5 mg via ORAL
  Filled 2022-09-10: qty 1

## 2022-09-10 MED ORDER — POLYETHYLENE GLYCOL 3350 17 G PO PACK: 17.0000 g | PACK | Freq: Every day | ORAL | Status: DC | PRN

## 2022-09-10 MED ORDER — ENOXAPARIN SODIUM 40 MG/0.4ML IJ SOSY
40.0000 mg | PREFILLED_SYRINGE | INTRAMUSCULAR | Status: DC
  Administered 2022-09-10: 40 mg via SUBCUTANEOUS
  Filled 2022-09-10: qty 0.4

## 2022-09-10 MED ORDER — FUROSEMIDE 10 MG/ML IJ SOLN
20.0000 mg | Freq: Two times a day (BID) | INTRAMUSCULAR | Status: DC
  Administered 2022-09-10 (×2): 20 mg via INTRAVENOUS
  Filled 2022-09-10 (×2): qty 2

## 2022-09-10 NOTE — Plan of Care (Signed)

## 2022-09-10 NOTE — Progress Notes (Signed)
Occupational Therapy Treatment Patient Details Name: Beth [name] MRN: [id number] DOB: 1945-02-02 Today's Date: 09/10/2022   History of present illness Pt. is a 77 year old female with history of COPD on 3L of O2 at night, HTN, depression, anxiety, neuropathy, insomnia, atrial fibrillation, on Eliquis, who presents to the emergency department for chief concerns of a fall in the morning.  Left lateral malleolus acute fracture (non-surgical).  Transferred to ICU 09/07/22 d/t BP concerns.  SVT/Afib RVR morning of 09/09/22.   OT comments  Upon entering the room, pt supine in bed and agreeable to OT intervention with maximal encouragement. Pt performs bed mobility with mod A for trunk support and L LE to EOB. Pt initially refusing to attempt standing. Pt reports she sat up in chair all morning and just got back to bed. OT taking orthostatics as pt reports feeling dizzy with results below. Pt does stand with mod lifting assistance from bed with use of RW and pt maintains NWB while taking BP. Pt declines further transfers and returns to bed with mod A. Call bell and all needed items within reach. Pt's son arrives as therapist exits the room.       If plan is discharge home, recommend the following:  A lot of help with walking and/or transfers;A lot of help with bathing/dressing/bathroom;Assistance with cooking/housework;Assist for transportation;Help with stairs or ramp for entrance;Direct supervision/assist for financial management;Direct supervision/assist for medications management   Equipment Recommendations  Other (comment) (defer to next venue of care)       Precautions / Restrictions Precautions Precautions: Fall Required Braces or Orthoses: Splint/Cast Splint/Cast: LLE splint/cast Restrictions Weight Bearing Restrictions: Yes LLE Weight Bearing: Non weight bearing       Mobility Bed Mobility Overal bed mobility: Needs Assistance Bed Mobility: Supine to Sit, Sit to Supine      Supine to sit: Mod assist Sit to supine: Mod assist   General bed mobility comments: assistance for trunk and L LE    Transfers Overall transfer level: Needs assistance Equipment used: Rolling walker (2 wheels) Transfers: Sit to/from Stand Sit to Stand: Mod assist                 Balance Overall balance assessment: Needs assistance Sitting-balance support: No upper extremity supported, Feet supported Sitting balance-Leahy Scale: Good                                     ADL either performed or assessed with clinical judgement    Extremity/Trunk Assessment Upper Extremity Assessment Upper Extremity Assessment: Generalized weakness   Lower Extremity Assessment Lower Extremity Assessment:  (R LE WFL) LLE Deficits / Details: at least 3/5 AROM hip flexion and knee flexion/extension; able to wiggle toes LLE: Unable to fully assess due to immobilization   Cervical / Trunk Assessment Cervical / Trunk Assessment: Normal    Vision Patient Visual Report: No change from baseline            Cognition Arousal: Alert Behavior During Therapy: WFL for tasks assessed/performed Overall Cognitive Status: Within Functional Limits for tasks assessed                                                     Pertinent Vitals/ Pain  Pain Assessment Pain Assessment: Faces Faces Pain Scale: Hurts a little bit Pain Location: L ankle Pain Descriptors / Indicators: Sore, Discomfort Pain Intervention(s): Limited activity within patient's tolerance, Monitored during session, Repositioned  Home Living Family/patient expects to be discharged to:: Other (Comment) (Senior apts) Living Arrangements: Alone Available Help at Discharge: Family;Available PRN/intermittently;Friend(s) Type of Home: Apartment Home Access: Level entry     Home Layout: One level     Bathroom Shower/Tub: Chief Strategy Officer: Standard Bathroom Accessibility:  Yes   Home Equipment: Grab bars - tub/shower;Shower seat - Estate manager/land agent (4 wheels)              Frequency  Min 1X/week        Progress Toward Goals  OT Goals(current goals can now be found in the care plan section)  Progress towards OT goals: Progressing toward goals     Plan Discharge plan remains appropriate;Frequency remains appropriate       AM-PAC OT "6 Clicks" Daily Activity     Outcome Measure   Help from another person eating meals?: None Help from another person taking care of personal grooming?: A Little Help from another person toileting, which includes using toliet, bedpan, or urinal?: A Lot Help from another person bathing (including washing, rinsing, drying)?: A Lot Help from another person to put on and taking off regular upper body clothing?: A Little Help from another person to put on and taking off regular lower body clothing?: A Lot 6 Click Score: 16    End of Session Equipment Utilized During Treatment: Oxygen  OT Visit Diagnosis: Other abnormalities of gait and mobility (R26.89);Muscle weakness (generalized) (M62.81);Repeated falls (R29.6);History of falling (Z91.81);Pain Pain - Right/Left: Left Pain - part of body: Ankle and joints of foot   Activity Tolerance Patient tolerated treatment well   Patient Left in bed;with bed alarm set;with call bell/phone within reach;with nursing/sitter in room   Nurse Communication Mobility status        Time: 1450-1504 OT Time Calculation (min): 14 min  Charges: OT General Charges $OT Visit: 1 Visit OT Treatments $Therapeutic Activity: 8-22 mins  Jackquline Denmark, MS, OTR/L , CBIS ascom [phone]  09/10/22, 3:46 PM

## 2022-09-10 NOTE — Consult Note (Signed)
ANTICOAGULATION CONSULT NOTE - Initial Consult  Pharmacy Consult for Heparin Infusion Indication: atrial fibrillation  Allergies  Allergen Reactions   Bextra  [Valdecoxib]    Compazine [Prochlorperazine Edisylate]     Stroke-like symptoms   Lithium Carbonate     Leg weakness   Lyrica [Pregabalin]     Patient Measurements: Height: 5\' 5"  (165.1 cm) Weight: 65.4 kg (144 lb 2.9 oz) IBW/kg (Calculated) : 57 Heparin Dosing Weight: 65.4 kg   Vital Signs: Temp: 98 F (36.7 C) (08/13 0000) BP: 139/63 (08/13 0600) Pulse Rate: 70 (08/13 0600)  Labs: Recent Labs    09/07/22 1349 09/07/22 1533 09/08/22 0514 09/09/22 0352 09/09/22 1046 09/09/22 2005 09/10/22 0606 09/10/22 0641  HGB 9.3*  --   --  8.5*  --   --   --  9.3*  HCT 28.6*  --   --  26.1*  --   --   --  29.6*  PLT  --   --   --  304  --   --   --  290  APTT  --   --   --   --  31 87* 66*  --   LABPROT  --   --   --   --  17.0*  --   --   --   INR  --   --   --   --  1.4*  --   --   --   HEPARINUNFRC  --   --   --   --  >1.10*  --  >1.10*  --   CREATININE  --   --  0.74 0.79  --   --   --   --   TROPONINIHS  --  9  --   --   --   --   --   --     Estimated Creatinine Clearance: 52.2 mL/min (by C-G formula based on SCr of 0.79 mg/dL).   Medical History: Past Medical History:  Diagnosis Date   Allergy    Anxiety    Asthma    Chronic pain syndrome    discharged from pain clinic, hx of narcotics seeking behavior   COPD (chronic obstructive pulmonary disease) (HCC)    CVA (cerebral infarction)    Depression    Fibromyalgia    Headache    Hyperlipidemia    Hypertension    IBS (irritable bowel syndrome)    Stroke (HCC)    Vitamin D deficiency     Assessment: ZOFIA FINNERAN is a 77 y.o. female presenting after fall. PMH significant for depression, anxiety, neuropathy, AF (on apixaban), insomnia. Patient was on [facility] PTA per chart review. Apixaban was continued on admission and last dose was 8/9 @ 2157.  Patient has has runs of SVT and AF while admitted with HR up to 180s. CHADS-VASc at least 7. Pharmacy has been consulted to initiate and manage heparin infusion.   Baseline Labs: aPTT 31, HL >1.10, PT 17.0, INR 1.4, Hgb 8.5, Hct 26.1, Plt 304   Goal of Therapy:  Heparin level 0.3-0.7 units/ml aPTT 66-102 seconds Monitor platelets by anticoagulation protocol: Yes  Date Time aPTT/HL Rate/Comment 0812 2005 87 sec  Therapeutic x 1 0813    0606   66,  > 1.10       Therapeutic X 2      PLAN: 8/13 @ 0606:  aPTT = 66,  HL = > 1.10 - aPTT therapeutic X 2,  HL still elevated  from Eliquis PTA Continue to titrate by aPTT until heparin level and aPTT correlate and/or apixaban washes out, then titrate by heparin level alone. Check heparin level and aPTT with next AM labs. Continue to monitor CBC daily while on heparin infusion.  , D Clinical Pharmacist 09/10/2022 6:58 AM

## 2022-09-10 NOTE — Progress Notes (Addendum)
PROGRESS NOTE    Beth [name]   VOZ:[id number] DOB: 29-May-1945  DOA: 09/05/2022 Date of Service: 09/10/22 PCP: Smitty Cords, DO     Brief Narrative / Hospital Course:  [name] is a 77 year old female with history of depression, anxiety, neuropathy, insomnia, atrial fibrillation, on Eliquis, who presents to the emergency department for chief concerns of a fall in the morning 09/05/22 - Reports that she tripped over her oxygen cord and fell. She denies loss of consciousness. She denies head trauma  08/08: to ED. Left lateral malleolus acute fracture that is minimally displaced. Admitted for ortho consult and pain control, may need SNF/STR. Per ortho (Dr Joice Lofts) plan for nonsurgical treatment w/ splint 08/09: PT/OT recs for SNF. Hypotensive, having to hold opiate pain medications, stopped beta blocker and lasix, and reduced gabapentin, melatonin  08/10: Concern for UTI w/ dysuria and abn UA, starting Rocephin. Cr up slightly, BP lower, scheduled midodrine, 1L NS bolus and BP improved only slightly. MAP <60. Hgb trending down but no s/s bleeding, checked again and trending better. No s/s infection other than UTI and not septic. Bradycardic. Holding opiates, reduced or d/c other potentially sedating meds. Given borderline BP not responding to fluids/midodrine and no clear reason for hypotension other than clinically a bit dry and of course polypharmayc, spoke w/ ICU and will transfer to stepdown for closer monitoring. Briefly was on pressors, but SBP improved >100 and patient was mentating ok, per PCCU suspect that low diastolic drives down her MAP / causes it to be artificially low. PCCU also agrees that frailty and recent injury w/ lack of reserve are most likely cause for low BP/weakness.  08/11: BP somewhat better though diastolic remains low, did not require pressors overnight. PCCM s/o.  08/12: BP improved. Hgb remains low 8.5. This AM SVT/Afib RVR, improved w/ diltiazem  and on gtt, echo ordered, heparin gtt since she's been off Eliquis d/t anemia and in case needing any procedures / reversal. Cardiology consult. Converted to sinus this afternoon. Echo EF 70-75%, diastolic fxn uncertain, severely elevated pulm artery pressure   08/13: BP improved, per cardio ok to d/c midodrine so will taper off today and probably can stop tomorrow if BP remains ok, resume Lasix for pulm HTN,    Consultants:  Orthopedics - ankle fracture  PCCM - hypotension  Procedures: none      ASSESSMENT & PLAN:   Principal Problem:   Inadequate pain control Active Problems:   HLD (hyperlipidemia)   HTN (hypertension)   Anxiety and depression   CVA, old, hemiparesis (HCC)   Fibromyalgia syndrome   Benign hypertension   Chronic recurrent major depressive disorder (HCC)   Major depressive disorder, recurrent, severe w/o psychotic behavior (HCC)   Chronic pain syndrome   Atherosclerosis of aorta (HCC)   Pulmonary hypertension (HCC)   Asthma with COPD   Closed fracture of left lateral malleolus   Atrial fibrillation, chronic (HCC)   Afib RVR, question SVT difficult to determine on telemetry but seems more likely Afib on EKG Atrial fibrillation, chronic (HCC) Diltiazem gtt --> off and maintaining on metoprolol tartrate 12.5 mg bid  heparin gtt since she's been off Eliquis d/t anemia and in case needing any procedures / reversal. Patient's last dose of Eliquis was evening of 8/7 --> opted to stay off d/t fall risk  TSH keep serum potassium greater than 4 less than 5 and Mg 2 daily BMP consider EP consult for Watchman as outpatient, though with comorbidities and  frailty, she may not be a good candidate.   Acute Left lateral malleolus acute fracture that is minimally displaced Mechanical Fall  Orthopedics following - conservative nonsurgical management Pain control Home Gabapentin 900 mg tid --> 600 tid --> 300 tid Oxycodone low dose if BP ok (SBP >110) Naproxen 500 mg  bid ac Morphine d/c   Essential HTN (hypertension) but hypotensive here Home furosemide and metoprolol were then held d/t low BP --> 8/12 restart metoprolol following Afib RVR,  8/13 restart furosemide 20 mg IV BID Wean off midodrine, hypotension has resolved.  UTI S/p Rocephin x3 doses Await UCx --> multiple species, will d/c abx  Not septic   AKI - resolved Likely poor po intake +/- UTI Trend BMP  HLD (hyperlipidemia) Home atorvastatin 40 mg nightly held d/t liver enzymes but this trending down, can likely resume at discharge / outpatient    COPD/Asthma Albuterol prn Breo + Incruse, Singulair   HFpEF Pulmonary hypertension (HCC) Patient wears 3 L [place] continuously Continue O2 support  Home furosemide had been held d/t low BP, today can restart furosemide 20 mg IV BID; titration as needed. Taper off midodrine, hypotension has resolved.  Chronic pain syndrome Pain control for ankle fracture - see above   Major depressive disorder, recurrent, severe w/o psychotic behavior (HCC) Anxiety and depression Home quetiapine 250 mg (confirmed filled with pharmacy) at bedtime held home fluoxetine 20 mg dialy resumed    Hypokalemia Replace as needed Monitor BMP    DVT prophylaxis: eliquis Pertinent IV fluids/nutrition: no contiuous IV fluids  Central lines / invasive devices: none   Code Status: FULL CODE ACP documentation reviewed: 09/06/22 none on file   Current Admission Status: inpatient   TOC needs / Dispo plan: SNF STR Barriers to discharge / significant pending items: placement, clinical improvement              Subjective / Brief ROS:  Patient reports feeling better this morning  No palpitations or chest pain  No dizziness or lightheadedness   Family Communication: none at this time      Objective Findings:  Vitals:   09/10/22 1200 09/10/22 1300 09/10/22 1400 09/10/22 1500  BP: (!) 129/56 (!) 143/58 124/71 (!) 130/54  Pulse: 66 70 87 83   Resp:      Temp: 98 F (36.7 C)     TempSrc: Oral     SpO2: 93% 91% 98% (!) 87%  Weight:      Height:        Intake/Output Summary (Last 24 hours) at 09/10/2022 1538 Last data filed at 09/10/2022 0500 Gross per 24 hour  Intake 321.53 ml  Output 550 ml  Net -228.47 ml   Filed Weights   09/05/22 1249 09/07/22 1659  Weight: 60.3 kg 65.4 kg    Examination:  Physical Exam Constitutional:      General: She is not in acute distress.    Appearance: She is not toxic-appearing.  Cardiovascular:     Rate and Rhythm: Normal rate. Rhythm irregular.     Heart sounds: Murmur heard.  Pulmonary:     Effort: Pulmonary effort is normal.     Breath sounds: Normal breath sounds.  Musculoskeletal:     Right lower leg: No edema.     Left lower leg: No edema.  Skin:    General: Skin is warm and dry.     Findings: No rash.  Neurological:     General: No focal deficit present.  Mental Status: She is alert and oriented to person, place, and time.  Psychiatric:        Mood and Affect: Mood normal.        Behavior: Behavior normal.          Scheduled Medications:   Chlorhexidine Gluconate Cloth  6 each Topical Q0600   FLUoxetine  20 mg Oral Daily   fluticasone furoate-vilanterol  1 puff Inhalation Daily   And   umeclidinium bromide  1 puff Inhalation Daily   furosemide  20 mg Intravenous BID   gabapentin  100 mg Oral TID   melatonin  5 mg Oral QHS   metoprolol tartrate  12.5 mg Oral BID   midodrine  5 mg Oral TID WC   montelukast  10 mg Oral QHS   traZODone  50 mg Oral QHS    Continuous Infusions:  sodium chloride Stopped (09/09/22 0940)    PRN Medications:  albuterol, docusate sodium, ketorolac, ondansetron **OR** ondansetron (ZOFRAN) IV, oxyCODONE, polyethylene glycol  Antimicrobials from admission:  Anti-infectives (From admission, onward)    Start     Dose/Rate Route Frequency Ordered Stop   09/07/22 1600  cefTRIAXone (ROCEPHIN) 1 g in sodium chloride 0.9 % 100  mL IVPB  Status:  Discontinued        1 g 200 mL/hr over 30 Minutes Intravenous Every 24 hours 09/07/22 1520 09/10/22 0957           Data Reviewed:  I have personally reviewed the following...  CBC: Recent Labs  Lab 09/05/22 1502 09/06/22 0610 09/07/22 0449 09/07/22 1349 09/09/22 0352 09/10/22 0641  WBC 12.0* 10.1 8.9  --  8.9 10.5  NEUTROABS 9.8*  --  4.8  --   --   --   HGB 10.8* 9.4* 8.8* 9.3* 8.5* 9.3*  HCT 33.9* 28.6* 26.9* 28.6* 26.1* 29.6*  MCV 96.9 95.3 96.1  --  96.7 97.7  PLT 325 307 289  --  304 290   Basic Metabolic Panel: Recent Labs  Lab 09/06/22 0610 09/07/22 0449 09/08/22 0510 09/08/22 0514 09/09/22 0352 09/10/22 0641  NA 139 140  --  140 144 143  K 3.7 3.2*  --  3.2* 3.7 4.1  CL 104 105  --  109 112* 109  CO2 28 29  --  24 26 26   GLUCOSE 102* 100*  --  92 99 117*  BUN 19 26*  --  20 16 13   CREATININE 0.89 1.19*  --  0.74 0.79 0.75  CALCIUM 8.8* 8.7*  --  8.5* 8.7* 9.2  MG  --  1.9 1.7  --   --   --    GFR: Estimated Creatinine Clearance: 52.2 mL/min (by C-G formula based on SCr of 0.75 mg/dL). Liver Function Tests: Recent Labs  Lab 09/05/22 1502 09/06/22 0610 09/07/22 0449 09/09/22 0352 09/10/22 0641  AST 360* 201* 93* 35 27  ALT 241* 180* 118* 64* 55*  ALKPHOS 297* 243* 168* 129* 137*  BILITOT 1.6* 1.3* 0.6 0.8 0.5  PROT 6.6 6.2* 5.6* 5.4* 6.4*  ALBUMIN 3.1* 2.8* 2.3* 2.4* 2.7*   No results for input(s): "LIPASE", "AMYLASE" in the last 168 hours. No results for input(s): "AMMONIA" in the last 168 hours. Coagulation Profile: Recent Labs  Lab 09/09/22 1046  INR 1.4*   Cardiac Enzymes: No results for input(s): "CKTOTAL", "CKMB", "CKMBINDEX", "TROPONINI" in the last 168 hours. BNP (last 3 results) No results for input(s): "PROBNP" in the last 8760 hours. HbA1C: No results for  input(s): "HGBA1C" in the last 72 hours. CBG: Recent Labs  Lab 09/07/22 1701  GLUCAP 101*   Lipid Profile: No results for input(s): "CHOL",  "HDL", "LDLCALC", "TRIG", "CHOLHDL", "LDLDIRECT" in the last 72 hours. Thyroid Function Tests: Recent Labs    09/09/22 0352  TSH 7.029*   Anemia Panel: No results for input(s): "VITAMINB12", "FOLATE", "FERRITIN", "TIBC", "IRON", "RETICCTPCT" in the last 72 hours. Most Recent Urinalysis On File:     Component Value Date/Time   COLORURINE YELLOW (A) 09/06/2022 1924   APPEARANCEUR HAZY (A) 09/06/2022 1924   APPEARANCEUR Hazy 12/25/2012 1507   LABSPEC 1.015 09/06/2022 1924   LABSPEC 1.015 12/25/2012 1507   PHURINE 5.0 09/06/2022 1924   GLUCOSEU NEGATIVE 09/06/2022 1924   GLUCOSEU Negative 12/25/2012 1507   HGBUR NEGATIVE 09/06/2022 1924   BILIRUBINUR NEGATIVE 09/06/2022 1924   BILIRUBINUR Negative 06/26/2022 1525   BILIRUBINUR Negative 12/25/2012 1507   KETONESUR NEGATIVE 09/06/2022 1924   PROTEINUR NEGATIVE 09/06/2022 1924   UROBILINOGEN 0.2 06/26/2022 1525   NITRITE NEGATIVE 09/06/2022 1924   LEUKOCYTESUR LARGE (A) 09/06/2022 1924   LEUKOCYTESUR 3+ 12/25/2012 1507   Sepsis Labs: @LABRCNTIP (procalcitonin:4,lacticidven:4) Microbiology: Recent Results (from the past 240 hour(s))  Urine Culture     Status: Abnormal   Collection Time: 09/06/22  7:24 PM   Specimen: Urine, Random  Result Value Ref Range Status   Specimen Description   Final    URINE, RANDOM Performed at [facility], [address], [place], [place] [address]    Special Requests   Final    NONE Reflexed from [id number] Performed at [facility], [address] [place], [place] [address]    Culture MULTIPLE SPECIES PRESENT, SUGGEST RECOLLECTION (A)  Final   Report Status 09/09/2022 FINAL  Final  MRSA Next Gen by PCR, Nasal     Status: None   Collection Time: 09/07/22  5:01 PM   Specimen: Nasal Mucosa; Nasal Swab  Result Value Ref Range Status   MRSA by PCR Next Gen NOT DETECTED NOT DETECTED Final    Comment: (NOTE) The GeneXpert MRSA Assay (FDA approved for NASAL specimens only), is one  component of a comprehensive MRSA colonization surveillance program. It is not intended to diagnose MRSA infection nor to guide or monitor treatment for MRSA infections. Test performance is not FDA approved in patients less than 63 years old. Performed at [facility], [address] [place], [place] [address]       Radiology Studies last 3 days: ECHOCARDIOGRAM COMPLETE  Result Date: 09/09/2022    ECHOCARDIOGRAM REPORT   Patient Name:   LAKAI TREVILLION Date of Exam: 09/09/2022 Medical Rec #:  [id number]        Height:       65.0 in Accession #:    [id number]       Weight:       144.2 lb Date of Birth:  05-29-1945        BSA:          1.721 m Patient Age:    76 years         BP:           126/64 mmHg Patient Gender: F                HR:           64 bpm. Exam Location:  [facility] Procedure: 2D Echo, Cardiac Doppler and Color Doppler Indications:     Atrial Fibrillation I48.91  History:  Patient has prior history of Echocardiogram examinations, most                  recent 08/26/2022. COPD; Risk Factors:Hypertension and                  Dyslipidemia.  Sonographer:     Cristela Blue Referring Phys:  [id number] Sunnie Nielsen Diagnosing Phys: Yvonne Kendall MD IMPRESSIONS  1. Left ventricular ejection fraction, by estimation, is 70 to 75%. The left ventricle has hyperdynamic function. Left ventricular endocardial border not optimally defined to evaluate regional wall motion. There is mild left ventricular hypertrophy. Left ventricular diastolic function could not be evaluated.  2. Pulmonary artery pressure is moderately to severely elevated (RVSP 50-55 mmHg plus central venous/right atrial pressure).. Right ventricular systolic function is normal. The right ventricular size is normal.  3. The mitral valve is degenerative. Mild mitral valve regurgitation. No evidence of mitral stenosis. Moderate mitral annular calcification.  4. Tricuspid valve regurgitation is moderate to severe.  5. The aortic  valve was not well visualized. Aortic valve regurgitation is not visualized. No aortic stenosis is present.  6. Pulmonic valve regurgitation not well-assessed. FINDINGS  Left Ventricle: Left ventricular ejection fraction, by estimation, is 70 to 75%. The left ventricle has hyperdynamic function. Left ventricular endocardial border not optimally defined to evaluate regional wall motion. The left ventricular internal cavity size was normal in size. There is mild left ventricular hypertrophy. Left ventricular diastolic function could not be evaluated due to atrial fibrillation. Left ventricular diastolic function could not be evaluated. Right Ventricle: Pulmonary artery pressure is moderately to severely elevated (RVSP 50-55 mmHg plus central venous/right atrial pressure). The right ventricular size is normal. Right vetricular wall thickness was not well visualized. Right ventricular systolic function is normal. Left Atrium: Left atrial size was normal in size. Right Atrium: Right atrial size was normal in size. Pericardium: Trivial pericardial effusion is present. Mitral Valve: The mitral valve is degenerative in appearance. There is mild thickening of the mitral valve leaflet(s). Moderate mitral annular calcification. Mild mitral valve regurgitation. No evidence of mitral valve stenosis. Tricuspid Valve: The tricuspid valve is not well visualized. Tricuspid valve regurgitation is moderate to severe. Aortic Valve: The aortic valve was not well visualized. Aortic valve regurgitation is not visualized. No aortic stenosis is present. Aortic valve mean gradient measures 5.0 mmHg. Aortic valve peak gradient measures 7.2 mmHg. Aortic valve area, by VTI measures 2.64 cm. Pulmonic Valve: The pulmonic valve was not well visualized. Pulmonic valve regurgitation not well-assessed. Aorta: The aortic root is normal in size and structure. Pulmonary Artery: The pulmonary artery is not well seen. Venous: The inferior vena cava was  not well visualized. IAS/Shunts: The interatrial septum was not well visualized.  LEFT VENTRICLE PLAX 2D LVIDd:         3.80 cm LVIDs:         2.30 cm LV PW:         1.10 cm LV IVS:        1.30 cm LVOT diam:     2.00 cm LV SV:         59 LV SV Index:   34 LVOT Area:     3.14 cm  RIGHT VENTRICLE RV Basal diam:  2.90 cm RV Mid diam:    2.60 cm LEFT ATRIUM             Index        RIGHT ATRIUM  Index LA diam:        4.30 cm 2.50 cm/m   RA Area:     12.80 cm LA Vol (A2C):   54.7 ml 31.78 ml/m  RA Volume:   28.70 ml  16.67 ml/m LA Vol (A4C):   36.7 ml 21.32 ml/m LA Biplane Vol: 48.6 ml 28.23 ml/m  AORTIC VALVE AV Area (Vmax):    2.28 cm AV Area (Vmean):   2.11 cm AV Area (VTI):     2.64 cm AV Vmax:           134.00 cm/s AV Vmean:          102.200 cm/s AV VTI:            0.222 m AV Peak Grad:      7.2 mmHg AV Mean Grad:      5.0 mmHg LVOT Vmax:         97.40 cm/s LVOT Vmean:        68.700 cm/s LVOT VTI:          0.187 m LVOT/AV VTI ratio: 0.84  AORTA Ao Root diam: 2.80 cm MITRAL VALVE                TRICUSPID VALVE MV Area (PHT): 5.20 cm     TR Peak grad:   53.6 mmHg MV Decel Time: 146 msec     TR Vmax:        366.00 cm/s MV E velocity: 136.00 cm/s                             SHUNTS                             Systemic VTI:  0.19 m                             Systemic Diam: 2.00 cm Yvonne Kendall MD Electronically signed by Yvonne Kendall MD Signature Date/Time: 09/09/2022/4:25:14 PM    Final              LOS: 5 days      Sunnie Nielsen, DO [facility] 09/10/2022, 3:38 PM    Dictation software may have been used to generate the above note. Typos may occur and escape review in typed/dictated notes. Please contact Dr Lyn Hollingshead directly for clarity if needed.  Staff may message me via secure chat in Epic  but this may not receive an immediate response,  please page me for urgent matters!  If 7PM-7AM, please contact night coverage [email or web address]

## 2022-09-10 NOTE — Progress Notes (Signed)
Patient Name: Beth [name] Date of Encounter: 09/10/2022 [facility] Cardiologist: Debbe Odea, MD   Interval Summary  .    Patient reports of intermittent chest tightness, shortness of breath, and palpitations throughout the day yesterday and overnight.  Vital Signs .    Vitals:   09/10/22 0300 09/10/22 0400 09/10/22 0549 09/10/22 0600  BP: (!) 130/57 (!) 117/54 (!) 126/57 139/63  Pulse: 62 72 66 70  Resp: (!) 23 19 (!) 23 19  Temp:      TempSrc:      SpO2: 95% 90% 98% 96%  Weight:      Height:        Intake/Output Summary (Last 24 hours) at 09/10/2022 0745 Last data filed at 09/10/2022 0500 Gross per 24 hour  Intake 1194.63 ml  Output 750 ml  Net 444.63 ml      09/07/2022    4:59 PM 09/05/2022   12:49 PM 08/30/2022    3:29 PM  Last 3 Weights  Weight (lbs) 144 lb 2.9 oz 133 lb 140 lb  Weight (kg) 65.4 kg 60.328 kg 63.504 kg      Telemetry/ECG    A-fib with RVR until 11:51 AM yesterday; NSR since then. - Personally Reviewed  Physical Exam .   GEN: No acute distress.   Neck: JVP ~8-10 cm with + HJR. Cardiac: RRR with 1/6 systolic murmur. Respiratory: Mildly diminished breath sounds throughout GI: Soft, nontender, non-distended  MS: Trace RLE edema with compression stockings in place.  LLE splinted.  Assessment & Plan .     Paroxysmal atrial fibrillation: Pt converted to NSR yesterday morning and has maintained NSR since then. -Continue metoprolol tartrate 12.5 mg BID. -Continue heparin infusion if hemoglobin remains stable.  Transition back to Eliquis when felt safe to do so from primary team's standpoint.  Long-term, will need to readdress safety of continued AC with falls and anemia.  Could consider EP consult for Watchman as outpatient, though with comorbidities and frailty, she may not be a good candidate.  HFpEF and pulmonary hypertension: Pt complaining of dyspnea and chest tightness.  JVP elevated.  Moderate-severe PH on echo this  admission and last month. -Start furosemide 20 mg IV BID; titration as needed. -Discontinue midodrine, as hypotension has resolved.  Chest pain: Nonspecific.  EKG while in a-fib yesterday showed widespread ST depressions.  Follow-up EKG back in NSR showed significant improvement, though subtle inferior ST depressions persisted.  EKG with vigorous LV function and no WMA.  Pt is not a good candidate for invasive procedures like cardiac catheterization at this time in light of her comorbidities and falls. -Increase metoprolol for antianginal therapy as BP allows. -Could add ranolazine if BP precludes uptitration of beta blocker or use of long-acting nitrate. -Could consider risk stratification with pharmacologic MPI if CP persists once patient has been adequately diuresed.  COPD and chronic respiratory failure with hypoxia: No wheezing on exam, though dyspnea is likely at least in part due to COPD. -Continue O2 and bronchodilators per primary team.  Hypotension: Resolved. -Discontinue midodrine.  Anemia: Hemoglobin stable today but still below baseline. -Continue close monitoring in the setting of anticoagulation. -Target hemoglobin > 8. -FOBT still needs to be collected.  Chronic pain: -Per primary team.  Left ankle fracture: -Conservative management per orthopedics.  UTI: -Per primary team.  For questions or updates, please contact [facility] Please consult [email or web address] for contact info under [facility].     Signed, Yvonne Kendall, MD

## 2022-09-10 NOTE — Evaluation (Signed)
Physical Therapy Re-Evaluation Patient Details Name: Beth [name] MRN: [id number] DOB: Oct 03, 1945 Today's Date: 09/10/2022  History of Present Illness  Pt. is a 77 year old female with history of COPD on 3L of O2 at night, HTN, depression, anxiety, neuropathy, insomnia, atrial fibrillation, on Eliquis, who presents to the emergency department for chief concerns of a fall in the morning.  Left lateral malleolus acute fracture (non-surgical).  Transferred to ICU 09/07/22 d/t BP concerns.  SVT/Afib RVR morning of 09/09/22.  Clinical Impression  PT re-evaluation performed.  Prior to hospital admission, pt was ambulatory.  Currently pt is mod assist with bed mobility and squat pivot transfer bed to recliner (pt able to maintain NWB'ing status with minimal cueing).  Limited activity d/t generalized weakness/fatigue.  Pt would currently benefit from skilled PT to address noted impairments and functional limitations (see below for any additional details).  Upon hospital discharge, pt would benefit from ongoing therapy.  PT POC reviewed and updated.    If plan is discharge home, recommend the following: A lot of help with walking and/or transfers;A lot of help with bathing/dressing/bathroom;Assist for transportation;Help with stairs or ramp for entrance;Assistance with cooking/housework   Can travel by private vehicle   No    Equipment Recommendations Wheelchair (measurements PT);Wheelchair cushion (measurements PT)  Recommendations for Other Services       Functional Status Assessment Patient has had a recent decline in their functional status and demonstrates the ability to make significant improvements in function in a reasonable and predictable amount of time.     Precautions / Restrictions Precautions Precautions: Fall Required Braces or Orthoses: Splint/Cast Splint/Cast: LLE splint/cast Restrictions Weight Bearing Restrictions: Yes LLE Weight Bearing: Non weight bearing       Mobility  Bed Mobility Overal bed mobility: Needs Assistance Bed Mobility: Supine to Sit     Supine to sit: Mod assist     General bed mobility comments: assist for trunk; vc's for technique    Transfers Overall transfer level: Needs assistance Equipment used: None Transfers: Bed to chair/wheelchair/BSC       Squat pivot transfers: Mod assist     General transfer comment: vc's for technique; squat pivot transfer to R bed to recliner    Ambulation/Gait               General Gait Details: pt declining to use walker at this time d/t weakness/fatigue  Stairs            Wheelchair Mobility     Tilt Bed    Modified Rankin (Stroke Patients Only)       Balance Overall balance assessment: Needs assistance Sitting-balance support: No upper extremity supported, Feet supported Sitting balance-Leahy Scale: Good Sitting balance - Comments: steady reaching within BOS                                     Pertinent Vitals/Pain Pain Assessment Pain Assessment: 0-10 Pain Score: 7  Pain Location: L ankle Pain Descriptors / Indicators: Sore, Discomfort Pain Intervention(s): Limited activity within patient's tolerance, Monitored during session, Repositioned, Other (comment) (RN notified) Vitals (HR and SpO2 on 3 L via nasal cannula) stable throughout treatment session.    Home Living Family/patient expects to be discharged to:: Other (Comment) (Senior apts) Living Arrangements: Alone Available Help at Discharge: Family;Available PRN/intermittently;Friend(s) Type of Home: Apartment Home Access: Level entry       Home Layout: One level Home  Equipment: Grab bars - tub/shower;Shower seat - built in;Rollator (4 wheels)      Prior Function Prior Level of Function : Independent/Modified Independent;History of Falls (last six months)             Mobility Comments: Modified independent ambulating with rollator.  4 falls in last 6 months d/t  tripping/LOB. ADLs Comments: Independent with ADL's; cares for her small dog     Extremity/Trunk Assessment   Upper Extremity Assessment Upper Extremity Assessment: Generalized weakness    Lower Extremity Assessment Lower Extremity Assessment:  (R LE WFL) LLE Deficits / Details: at least 3/5 AROM hip flexion and knee flexion/extension; able to wiggle toes LLE: Unable to fully assess due to immobilization    Cervical / Trunk Assessment Cervical / Trunk Assessment: Normal  Communication   Communication Communication: No apparent difficulties  Cognition Arousal: Alert Behavior During Therapy: WFL for tasks assessed/performed Overall Cognitive Status: Within Functional Limits for tasks assessed                                 General Comments: Pt pleasant and cooperative        General Comments  Nursing cleared pt for participation in physical therapy.  Pt agreeable to PT session.    Exercises  Transfer training   Assessment/Plan    PT Assessment Patient needs continued PT services  PT Problem List Decreased strength;Decreased activity tolerance;Decreased balance;Decreased mobility;Decreased knowledge of use of DME;Pain;Decreased safety awareness;Decreased knowledge of precautions       PT Treatment Interventions DME instruction;Gait training;Functional mobility training;Therapeutic activities;Therapeutic exercise;Balance training;Patient/family education    PT Goals (Current goals can be found in the Care Plan section)  Acute Rehab PT Goals Patient Stated Goal: to improve mobility PT Goal Formulation: With patient Time For Goal Achievement: 09/24/22 Potential to Achieve Goals: Good    Frequency Min 1X/week     Co-evaluation               AM-PAC PT "6 Clicks" Mobility  Outcome Measure Help needed turning from your back to your side while in a flat bed without using bedrails?: A Little Help needed moving from lying on your back to sitting on  the side of a flat bed without using bedrails?: A Lot Help needed moving to and from a bed to a chair (including a wheelchair)?: A Lot Help needed standing up from a chair using your arms (e.g., wheelchair or bedside chair)?: A Lot Help needed to walk in hospital room?: Total Help needed climbing 3-5 steps with a railing? : Total 6 Click Score: 11    End of Session Equipment Utilized During Treatment: Gait belt;Oxygen Activity Tolerance: Patient limited by fatigue Patient left: in chair;with call bell/phone within reach;with chair alarm set;with nursing/sitter in room;Other (comment) (L LE elevated on pillow) Nurse Communication: Mobility status;Precautions PT Visit Diagnosis: Unsteadiness on feet (R26.81);History of falling (Z91.81);Difficulty in walking, not elsewhere classified (R26.2);Muscle weakness (generalized) (M62.81);Pain;Other abnormalities of gait and mobility (R26.89) Pain - Right/Left: Left Pain - part of body: Ankle and joints of foot    Time: 4540-9811 PT Time Calculation (min) (ACUTE ONLY): 21 min   Charges:   PT Evaluation $PT Re-evaluation: 1 Re-eval PT Treatments $Therapeutic Activity: 8-22 mins PT General Charges $$ ACUTE PT VISIT: 1 Visit        Hendricks Limes, PT 09/10/22, 2:58 PM

## 2022-09-11 ENCOUNTER — Telehealth: Payer: Self-pay

## 2022-09-11 DIAGNOSIS — I4891 Unspecified atrial fibrillation: Secondary | ICD-10-CM | POA: Diagnosis not present

## 2022-09-11 DIAGNOSIS — L8990 Pressure ulcer of unspecified site, unspecified stage: Secondary | ICD-10-CM | POA: Insufficient documentation

## 2022-09-11 DIAGNOSIS — R52 Pain, unspecified: Secondary | ICD-10-CM | POA: Diagnosis not present

## 2022-09-11 DIAGNOSIS — J9611 Chronic respiratory failure with hypoxia: Secondary | ICD-10-CM

## 2022-09-11 DIAGNOSIS — S82892A Other fracture of left lower leg, initial encounter for closed fracture: Secondary | ICD-10-CM | POA: Diagnosis not present

## 2022-09-11 DIAGNOSIS — R748 Abnormal levels of other serum enzymes: Secondary | ICD-10-CM

## 2022-09-11 MED ORDER — OXYCODONE HCL 5 MG PO TABS
5.0000 mg | ORAL_TABLET | Freq: Four times a day (QID) | ORAL | Status: DC | PRN
  Administered 2022-09-11 – 2022-09-13 (×6): 5 mg via ORAL
  Filled 2022-09-11 (×6): qty 1

## 2022-09-11 MED ORDER — POTASSIUM CHLORIDE CRYS ER 20 MEQ PO TBCR
40.0000 meq | EXTENDED_RELEASE_TABLET | Freq: Two times a day (BID) | ORAL | Status: AC
  Administered 2022-09-11 (×2): 40 meq via ORAL
  Filled 2022-09-11 (×2): qty 2

## 2022-09-11 MED ORDER — ACETAMINOPHEN 500 MG PO TABS
500.0000 mg | ORAL_TABLET | Freq: Four times a day (QID) | ORAL | Status: DC | PRN
  Administered 2022-09-11 – 2022-09-12 (×3): 500 mg via ORAL
  Filled 2022-09-11 (×3): qty 1

## 2022-09-11 MED ORDER — FUROSEMIDE 40 MG PO TABS
40.0000 mg | ORAL_TABLET | Freq: Every day | ORAL | Status: DC
  Administered 2022-09-11: 40 mg via ORAL
  Filled 2022-09-11: qty 1

## 2022-09-11 MED ORDER — APIXABAN 5 MG PO TABS
5.0000 mg | ORAL_TABLET | Freq: Two times a day (BID) | ORAL | Status: DC
  Administered 2022-09-11 – 2022-09-13 (×4): 5 mg via ORAL
  Filled 2022-09-11 (×4): qty 1

## 2022-09-11 MED ORDER — LEVOTHYROXINE SODIUM 25 MCG PO TABS
25.0000 ug | ORAL_TABLET | Freq: Every day | ORAL | Status: DC
  Administered 2022-09-12 – 2022-09-13 (×2): 25 ug via ORAL
  Filled 2022-09-11 (×2): qty 1

## 2022-09-11 MED ORDER — FUROSEMIDE 10 MG/ML IJ SOLN
40.0000 mg | Freq: Two times a day (BID) | INTRAMUSCULAR | Status: DC
  Administered 2022-09-11 – 2022-09-13 (×4): 40 mg via INTRAVENOUS
  Filled 2022-09-11 (×4): qty 4

## 2022-09-11 NOTE — Telephone Encounter (Signed)
Copied from CRM [id number]. Topic: General - Inquiry >> Sep 11, 2022  8:50 AM Marlow Baars wrote: Reason for CRM: The daughter of the patient called stating her mother was admitted to [facility] last Thursday due to a fall and broken ankle but she also had concerning low bp and went into Afib while she was there. She was then sent to the ICU for a bit but as of yesterday she was moved to a regular room. She is being set up for therapy as soon as she is discharged but the daughter wants to make sure she has a CNA available for home care as soon as she gets out. She is not currently sure when that will be. Please assist patient further

## 2022-09-11 NOTE — Telephone Encounter (Signed)
Spoke with Beth Land LCSW (CCM team) and she will coordinate with inpatient LCSW today and work on addressing issues. Patient may be discharged to SNF.  Saralyn Pilar, DO [facility] 09/11/2022, 1:27 PM

## 2022-09-11 NOTE — Progress Notes (Signed)
Rounding Note    Patient Name: Beth [name] Date of Encounter: 09/11/2022  [facility] Cardiologist: Debbe Odea, MD   Subjective   Patient seen on a.m. rounds.  Continues to report intermittent chest tightness, occasional shortness of breath, and just generalized weakness.  No overnight events have been recorded.  Inpatient Medications    Scheduled Meds:  Chlorhexidine Gluconate Cloth  6 each Topical Q0600   enoxaparin (LOVENOX) injection  40 mg Subcutaneous Q24H   FLUoxetine  20 mg Oral Daily   fluticasone furoate-vilanterol  1 puff Inhalation Daily   And   umeclidinium bromide  1 puff Inhalation Daily   furosemide  40 mg Oral Daily   gabapentin  100 mg Oral TID   melatonin  5 mg Oral QHS   metoprolol tartrate  12.5 mg Oral BID   montelukast  10 mg Oral QHS   potassium chloride  40 mEq Oral BID   traZODone  50 mg Oral QHS   Continuous Infusions:  sodium chloride Stopped (09/09/22 0940)   PRN Meds: acetaminophen, albuterol, oxyCODONE, polyethylene glycol   Vital Signs    Vitals:   09/10/22 2334 09/11/22 0352 09/11/22 0538 09/11/22 0756  BP: (!) 126/57 128/80  (!) 124/59  Pulse: 76 73  73  Resp: 17 17  16   Temp: 98.3 F (36.8 C) 98.3 F (36.8 C)  98.4 F (36.9 C)  TempSrc: Oral Oral  Oral  SpO2: 91% 93%  90%  Weight:   65 kg   Height:        Intake/Output Summary (Last 24 hours) at 09/11/2022 1129 Last data filed at 09/11/2022 1027 Gross per 24 hour  Intake 702.08 ml  Output 350 ml  Net 352.08 ml      09/11/2022    5:38 AM 09/07/2022    4:59 PM 09/05/2022   12:49 PM  Last 3 Weights  Weight (lbs) 143 lb 4.8 oz 144 lb 2.9 oz 133 lb  Weight (kg) 65 kg 65.4 kg 60.328 kg      Telemetry    Sinus in the 70s- Personally Reviewed  ECG    No new tracings- Personally Reviewed  Physical Exam   GEN: No acute distress.   Neck: + JVD Cardiac: RRR, I/VI systolic murmur without rubs or gallops.  Respiratory: Diminished to auscultation  bilaterally.  Respirations are unlabored at rest on 4 L of O2 via nasal cannula GI: Soft, nontender, non-distended  MS: Trace edema to RLE, LLE splinted ; No deformity. Neuro:  Nonfocal  Psych: Normal affect   Labs    High Sensitivity Troponin:   Recent Labs  Lab 08/30/22 1530 08/30/22 1732 09/05/22 1502 09/05/22 1712 09/07/22 1533  TROPONINIHS 10 10 14 16 9      Chemistry Recent Labs  Lab 09/07/22 0449 09/08/22 0510 09/08/22 0514 09/09/22 0352 09/10/22 0641 09/11/22 0509  NA 140  --    < > 144 143 141  K 3.2*  --    < > 3.7 4.1 3.2*  CL 105  --    < > 112* 109 105  CO2 29  --    < > 26 26 30   GLUCOSE 100*  --    < > 99 117* 112*  BUN 26*  --    < > 16 13 13   CREATININE 1.19*  --    < > 0.79 0.75 0.77  CALCIUM 8.7*  --    < > 8.7* 9.2 8.8*  MG 1.9 1.7  --   --   --   --  PROT 5.6*  --   --  5.4* 6.4*  --   ALBUMIN 2.3*  --   --  2.4* 2.7*  --   AST 93*  --   --  35 27  --   ALT 118*  --   --  64* 55*  --   ALKPHOS 168*  --   --  129* 137*  --   BILITOT 0.6  --   --  0.8 0.5  --   GFRNONAA 47*  --    < > >60 >60 >60  ANIONGAP 6  --    < > 6 8 6    < > = values in this interval not displayed.    Lipids No results for input(s): "CHOL", "TRIG", "HDL", "LABVLDL", "LDLCALC", "CHOLHDL" in the last 168 hours.  Hematology Recent Labs  Lab 09/09/22 0352 09/10/22 0641 09/11/22 0509  WBC 8.9 10.5 9.5  RBC 2.70* 3.03* 2.55*  HGB 8.5* 9.3* 8.0*  HCT 26.1* 29.6* 24.2*  MCV 96.7 97.7 94.9  MCH 31.5 30.7 31.4  MCHC 32.6 31.4 33.1  RDW 14.4 14.6 14.6  PLT 304 290 268   Thyroid  Recent Labs  Lab 09/09/22 0352  TSH 7.029*    BNPNo results for input(s): "BNP", "PROBNP" in the last 168 hours.  DDimer No results for input(s): "DDIMER" in the last 168 hours.   Radiology    No results found.  Cardiac Studies   TTE 08/26/22 1. Left ventricular ejection fraction, by estimation, is 55 to 60%. The  left ventricle has normal function. The left ventricle has no regional   wall motion abnormalities. There is mild left ventricular hypertrophy.  Left ventricular diastolic parameters  are indeterminate.   2. Right ventricular systolic function is normal. The right ventricular  size is normal. There is severely elevated pulmonary artery systolic  pressure.   3. Left atrial size was mild to moderately dilated.   4. Right atrial size was mildly dilated.   5. The mitral valve is normal in structure. Mild mitral valve  regurgitation. No evidence of mitral stenosis.   6. Tricuspid valve regurgitation is moderate.   7. The aortic valve is normal in structure. Aortic valve regurgitation is  not visualized. Aortic valve sclerosis/calcification is present, without  any evidence of aortic stenosis.   8. The inferior vena cava is normal in size with <50% respiratory  variability, suggesting right atrial pressure of 8 mmHg.    TTE with bubble study 03/24/22 1. Left ventricular ejection fraction, by estimation, is 60 to 65%. The  left ventricle has normal function. The left ventricle has no regional  wall motion abnormalities. Left ventricular diastolic parameters were  normal.   2. Right ventricular systolic function is normal. The right ventricular  size is normal.   3. The mitral valve is normal in structure. Mild to moderate mitral valve  regurgitation. No evidence of mitral stenosis.   4. Tricuspid valve regurgitation is moderate to severe.   5. The aortic valve is normal in structure. Aortic valve regurgitation is  mild. No aortic stenosis is present.   6. The inferior vena cava is normal in size with greater than 50%  respiratory variability, suggesting right atrial pressure of 3 mmHg.   7. Agitated saline contrast bubble study was negative, with no evidence  of any interatrial shunt.   Patient Profile     77 y.o. female with a past medical history of paroxysmal atrial fibrillation on chronic apixaban, depression/anxiety, neuropathy, insomnia,  asthma, HFpEF,  CVA, COPD, former smoker, hypertension, hyperlipidemia who is admitted on 09/09/2019 was been seen and evaluated for atrial fibrillation RVR.  Assessment & Plan    Paroxysmal atrial fibrillation -Currently maintaining sinus rhythm -Presented to the [facility] emergency department A-fib RVR converted with IV diltiazem -Heparin infusion discontinued and patient currently not taking apixaban due to anemia -Continued on metoprolol -Continue with telemetry monitoring  HFpEF with pulmonary hypertension -Patient continues to complain of dyspnea and chest tightness -JVP remains elevated -Moderate to severe pulmonary hypertension on echo this admission last month -Patient transition from IV furosemide to oral furosemide this morning by primary team -+472 mL in the last 24 hours -Net balance is +5.2 L since admission -Not a good candidate for SGLT2 inhibitor with recurrent UTIs  Atypical chest pain -Nonspecific without EKG changes -Echocardiogram revealed no wall motion abnormalities -Patient is currently not a good candidate for invasive procedures like cardiac catheterization due to her comorbidities and recurrent falls -Continued on metoprolol for antianginal therapy as blood pressure allows -Can consider pharmacologic MPI if chest pain persist once patient has had adequate diuresis -Blood Transfusion may help with the shortness of breath and chest discomfort as well -Can consider long-acting nitrates if chest pain continues  Anemia -Hemoglobin 8 on arrival she was 10.8, prior to starting apixaban she was 11.2 -No active bleeding noted -FOBT still to be obtained -Daily CBC -Recommend transfusing to keep hemoglobin greater than 8 if needed, continued chest discomfort patient may benefit from blood transfusion  Hypokalemia -Serum potassium 3.2 -Potassium supplementations ordered -Daily BMP -Recommend keeping potassium greater than 4 less than 5  COPD with chronic respiratory failure with  hypoxia -Maintaining oxygen saturations on 4 L of O2 via nasal cannula -Bronchodilators -Supportive care  UTI -Continued management per primary team  Left ankle fracture -Management per orthopedics -Ankle remains splinted  Chronic pain -Per primary team     For questions or updates, please contact [facility] Please consult [email or web address] for contact info under        Signed,  , NP  09/11/2022, 11:29 AM
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
## 2022-09-11 NOTE — Care Management Important Message (Signed)
Important Message  Patient Details  Name: Beth [name] MRN: [id number] Date of Birth: [birth year]   Medicare Important Message Given:  Yes     Johnell Comings 09/11/2022, 10:39 AM

## 2022-09-11 NOTE — Progress Notes (Signed)
PROGRESS NOTE GEZELLE ARAB   XBJ:[id number] DOB: May 12, 1945  DOA: 09/05/2022 Date of Service: 09/11/22 PCP: Smitty Cords, DO  Brief Narrative / Hospital Course:  Ms. Ronae Raybould is a 77 year old female with history of depression, anxiety, neuropathy, insomnia, atrial fibrillation, on Eliquis.  They presented to the emergency department for chief concerns of a fall in the morning 09/05/22 - Reports that she tripped over her oxygen cord and fell. She denies loss of consciousness. She denies head trauma. On 3L O2 at baseline. Typically wears at bedtime only. 08/08: to ED. Left lateral malleolus acute fracture that is minimally displaced. Admitted for ortho consult and pain control, may need SNF/STR. Per ortho (Dr Joice Lofts) plan for nonsurgical treatment w/ splint 08/09: PT/OT recs for SNF. Hypotensive, having to hold opiate pain medications, stopped beta blocker and lasix, and reduced gabapentin, melatonin  08/10: Concern for UTI w/ dysuria and abn UA, starting Rocephin. Cr up slightly, BP lower, scheduled midodrine, 1L NS bolus and BP improved only slightly. MAP <60. Hgb trending down but no s/s bleeding, checked again and trending better. No s/s infection other than UTI and not septic. Bradycardic. Holding opiates, reduced or d/c other potentially sedating meds. Given borderline BP not responding to fluids/midodrine and no clear reason for hypotension other than clinically a bit dry and of course polypharmayc, spoke w/ ICU and will transfer to stepdown for closer monitoring. Briefly was on pressors, but SBP improved >100 and patient was mentating ok, per PCCU suspect that low diastolic drives down her MAP / causes it to be artificially low. PCCU also agrees that frailty and recent injury w/ lack of reserve are most likely cause for low BP/weakness.  08/11: BP somewhat better though diastolic remains low, did not require pressors overnight. PCCM s/o.  08/12: BP improved. Hgb remains low  8.5. This AM SVT/Afib RVR, improved w/ diltiazem and on gtt, echo ordered, heparin gtt since she's been off Eliquis d/t anemia and in case needing any procedures / reversal. Cardiology consult. Converted to sinus this afternoon. Echo EF 70-75%, diastolic fxn uncertain, severely elevated pulm artery pressure   08/13: BP improved, per cardio ok to d/c midodrine so will taper off today and probably can stop tomorrow if BP remains ok, resume Lasix for pulm HTN,  8/14: stable and awaiting dc to SNF. On baseline oxygen saturation. Continues to work with PT/OT.     Consultants:  Orthopedics - ankle fracture  PCCM - hypotension   Procedures: none   ASSESSMENT & PLAN: Acute Left lateral malleolus acute fracture, minimally displaced Mechanical Fall  Orthopedics consulted- conservative nonsurgical management Outpatient f/u Pain control Home Gabapentin 900 mg tid --> 600 tid --> 300 tid Oxycodone low dose if BP ok (SBP >110) Naproxen 500 mg bid ac Tylenol PRN  Afib RVR- s/p dilt gtt. Now rate controlled on metoprolol  Continue metoprolol tartrate 12.5 mg bid. BP is tolerating.  Continue eliquis consider EP consult for [facility] as outpatient  Elevated TSH- 7.029 this admission. May contribute to hard to manage Afib.  - start conservative dose levothyroxine and recheck in outpatient setting out of acute illness  H/o CVA with mild residual L hemiparesis?contributed to initial fall. Patient states she is at her baseline strength and function currently from that previous stroke. Increased fall risk now with ankle fracture  - continue PT/OT   Essential HTN (hypertension) but hypotensive here Home furosemide and metoprolol were then held d/t low BP --> 8/12 restart metoprolol following Afib RVR,  8/13 restart furosemide 20 mg IV BID Wean off midodrine, hypotension has resolved.   UTI- s/p treatment   AKI - resolved Likely poor po intake +/- UTI Trend BMP   HLD (hyperlipidemia) Home  atorvastatin 40 mg nightly held d/t liver enzymes but this trending down, can likely resume at discharge / outpatient    COPD/Asthma  Pulmonary hypertension (HCC)- stable at baseline  Albuterol prn Breo + Incruse, Singulair   HFpEF  Patient wears 3 L [place] continuously Continue O2 support  Continue home furosemide 20 mg IV BID; titration as needed. Taper off midodrine, hypotension has resolved.   Chronic pain syndrome Pain control for ankle fracture - see above   Major depressive disorder, recurrent, severe w/o psychotic behavior (HCC) Anxiety and depression Home quetiapine 250 mg (confirmed filled with pharmacy) at bedtime held home fluoxetine 20 mg dialy resumed    Hypokalemia Replace as needed Monitor BMP    DVT prophylaxis: eliquis Pertinent IV fluids/nutrition: no contiuous IV fluids  Central lines / invasive devices: none    Code Status: FULL CODE ACP documentation reviewed   Current Admission Status: inpatient   TOC needs / Dispo plan: SNF STR Barriers to discharge / significant pending items: placement, clinical improvement    Subjective / Brief ROS:  Patient reports doing well today. Has not put weight on her ankle fracture. Is apprehensive about SNF but willing to go.    Family Communication: none at bedside  Objective Findings: Blood pressure (!) 115/51, pulse 74, temperature 98.3 F (36.8 C), temperature source Oral, resp. rate 20, height 5\' 5"  (1.651 m), weight 65 kg, SpO2 90%.  Examination:  Physical Exam Constitutional:      General: She is not in acute distress.    Appearance: She is normal weight. She is not ill-appearing or toxic-appearing.  HENT:     Mouth/Throat:     Mouth: Mucous membranes are moist.  Cardiovascular:     Rate and Rhythm: Normal rate and regular rhythm.     Heart sounds: Normal heart sounds. No murmur heard. Pulmonary:     Effort: Pulmonary effort is normal.     Breath sounds: Normal breath sounds.  Abdominal:      General: Abdomen is flat.     Palpations: Abdomen is soft.     Tenderness: There is no abdominal tenderness.  Musculoskeletal:     Right lower leg: No edema.     Left lower leg: No edema.  Skin:    General: Skin is warm and dry.     Capillary Refill: Capillary refill takes less than 2 seconds.     Findings: No rash.  Neurological:     General: No focal deficit present.     Mental Status: She is alert and oriented to person, place, and time.  Psychiatric:        Mood and Affect: Mood normal.        Behavior: Behavior normal.   Data Reviewed:  I have personally reviewed the following...  CBC: Recent Labs  Lab 09/05/22 1502 09/06/22 0610 09/07/22 0449 09/07/22 1349 09/09/22 0352 09/10/22 0641 09/11/22 0509  WBC 12.0* 10.1 8.9  --  8.9 10.5 9.5  NEUTROABS 9.8*  --  4.8  --   --   --   --   HGB 10.8* 9.4* 8.8* 9.3* 8.5* 9.3* 8.0*  HCT 33.9* 28.6* 26.9* 28.6* 26.1* 29.6* 24.2*  MCV 96.9 95.3 96.1  --  96.7 97.7 94.9  PLT 325 307 289  --  304 290 268   Basic Metabolic Panel: Recent Labs  Lab 09/07/22 0449 09/08/22 0510 09/08/22 0514 09/09/22 0352 09/10/22 0641 09/11/22 0509  NA 140  --  140 144 143 141  K 3.2*  --  3.2* 3.7 4.1 3.2*  CL 105  --  109 112* 109 105  CO2 29  --  24 26 26 30   GLUCOSE 100*  --  92 99 117* 112*  BUN 26*  --  20 16 13 13   CREATININE 1.19*  --  0.74 0.79 0.75 0.77  CALCIUM 8.7*  --  8.5* 8.7* 9.2 8.8*  MG 1.9 1.7  --   --   --   --    GFR: Estimated Creatinine Clearance: 52.2 mL/min (by C-G formula based on SCr of 0.77 mg/dL). Liver Function Tests: Recent Labs  Lab 09/05/22 1502 09/06/22 0610 09/07/22 0449 09/09/22 0352 09/10/22 0641  AST 360* 201* 93* 35 27  ALT 241* 180* 118* 64* 55*  ALKPHOS 297* 243* 168* 129* 137*  BILITOT 1.6* 1.3* 0.6 0.8 0.5  PROT 6.6 6.2* 5.6* 5.4* 6.4*  ALBUMIN 3.1* 2.8* 2.3* 2.4* 2.7*   Thyroid Function Tests: Recent Labs    09/09/22 0352  TSH 7.029*   Radiology Studies last 3  days: ECHOCARDIOGRAM COMPLETE  Result Date: 09/09/2022    ECHOCARDIOGRAM REPORT   Patient Name:   SEELA SCHILZ Date of Exam: 09/09/2022 Medical Rec #:  [id number]        Height:       65.0 in Accession #:    [id number]       Weight:       144.2 lb Date of Birth:  May 04, 1945        BSA:          1.721 m Patient Age:    76 years         BP:           126/64 mmHg Patient Gender: F                HR:           64 bpm. Exam Location:  [facility] Procedure: 2D Echo, Cardiac Doppler and Color Doppler Indications:     Atrial Fibrillation I48.91  History:         Patient has prior history of Echocardiogram examinations, most                  recent 08/26/2022. COPD; Risk Factors:Hypertension and                  Dyslipidemia.  Sonographer:     Cristela Blue Referring Phys:  [id number] Sunnie Nielsen Diagnosing Phys: Yvonne Kendall MD IMPRESSIONS  1. Left ventricular ejection fraction, by estimation, is 70 to 75%. The left ventricle has hyperdynamic function. Left ventricular endocardial border not optimally defined to evaluate regional wall motion. There is mild left ventricular hypertrophy. Left ventricular diastolic function could not be evaluated.  2. Pulmonary artery pressure is moderately to severely elevated (RVSP 50-55 mmHg plus central venous/right atrial pressure).. Right ventricular systolic function is normal. The right ventricular size is normal.  3. The mitral valve is degenerative. Mild mitral valve regurgitation. No evidence of mitral stenosis. Moderate mitral annular calcification.  4. Tricuspid valve regurgitation is moderate to severe.  5. The aortic valve was not well visualized. Aortic valve regurgitation is not visualized. No aortic stenosis is present.  6. Pulmonic valve regurgitation not well-assessed. FINDINGS  Left  Ventricle: Left ventricular ejection fraction, by estimation, is 70 to 75%. The left ventricle has hyperdynamic function. Left ventricular endocardial border not optimally defined to evaluate  regional wall motion. The left ventricular internal cavity size was normal in size. There is mild left ventricular hypertrophy. Left ventricular diastolic function could not be evaluated due to atrial fibrillation. Left ventricular diastolic function could not be evaluated. Right Ventricle: Pulmonary artery pressure is moderately to severely elevated (RVSP 50-55 mmHg plus central venous/right atrial pressure). The right ventricular size is normal. Right vetricular wall thickness was not well visualized. Right ventricular systolic function is normal. Left Atrium: Left atrial size was normal in size. Right Atrium: Right atrial size was normal in size. Pericardium: Trivial pericardial effusion is present. Mitral Valve: The mitral valve is degenerative in appearance. There is mild thickening of the mitral valve leaflet(s). Moderate mitral annular calcification. Mild mitral valve regurgitation. No evidence of mitral valve stenosis. Tricuspid Valve: The tricuspid valve is not well visualized. Tricuspid valve regurgitation is moderate to severe. Aortic Valve: The aortic valve was not well visualized. Aortic valve regurgitation is not visualized. No aortic stenosis is present. Aortic valve mean gradient measures 5.0 mmHg. Aortic valve peak gradient measures 7.2 mmHg. Aortic valve area, by VTI measures 2.64 cm. Pulmonic Valve: The pulmonic valve was not well visualized. Pulmonic valve regurgitation not well-assessed. Aorta: The aortic root is normal in size and structure. Pulmonary Artery: The pulmonary artery is not well seen. Venous: The inferior vena cava was not well visualized. IAS/Shunts: The interatrial septum was not well visualized.  LEFT VENTRICLE PLAX 2D LVIDd:         3.80 cm LVIDs:         2.30 cm LV PW:         1.10 cm LV IVS:        1.30 cm LVOT diam:     2.00 cm LV SV:         59 LV SV Index:   34 LVOT Area:     3.14 cm  RIGHT VENTRICLE RV Basal diam:  2.90 cm RV Mid diam:    2.60 cm LEFT ATRIUM              Index        RIGHT ATRIUM           Index LA diam:        4.30 cm 2.50 cm/m   RA Area:     12.80 cm LA Vol (A2C):   54.7 ml 31.78 ml/m  RA Volume:   28.70 ml  16.67 ml/m LA Vol (A4C):   36.7 ml 21.32 ml/m LA Biplane Vol: 48.6 ml 28.23 ml/m  AORTIC VALVE AV Area (Vmax):    2.28 cm AV Area (Vmean):   2.11 cm AV Area (VTI):     2.64 cm AV Vmax:           134.00 cm/s AV Vmean:          102.200 cm/s AV VTI:            0.222 m AV Peak Grad:      7.2 mmHg AV Mean Grad:      5.0 mmHg LVOT Vmax:         97.40 cm/s LVOT Vmean:        68.700 cm/s LVOT VTI:          0.187 m LVOT/AV VTI ratio: 0.84  AORTA Ao Root diam: 2.80 cm MITRAL VALVE  TRICUSPID VALVE MV Area (PHT): 5.20 cm     TR Peak grad:   53.6 mmHg MV Decel Time: 146 msec     TR Vmax:        366.00 cm/s MV E velocity: 136.00 cm/s                             SHUNTS                             Systemic VTI:  0.19 m                             Systemic Diam: 2.00 cm Yvonne Kendall MD Electronically signed by Yvonne Kendall MD Signature Date/Time: 09/09/2022/4:25:14 PM    Final       LOS: 6 days    Leeroy Bock, DO [facility] 09/11/2022, 8:11 AM   If 7PM-7AM, please contact night coverage [email or web address]

## 2022-09-12 ENCOUNTER — Ambulatory Visit: Payer: 59 | Admitting: Family Medicine

## 2022-09-12 ENCOUNTER — Telehealth: Payer: Self-pay | Admitting: *Deleted

## 2022-09-12 DIAGNOSIS — J438 Other emphysema: Secondary | ICD-10-CM

## 2022-09-12 DIAGNOSIS — S82892A Other fracture of left lower leg, initial encounter for closed fracture: Secondary | ICD-10-CM | POA: Diagnosis not present

## 2022-09-12 DIAGNOSIS — I4891 Unspecified atrial fibrillation: Secondary | ICD-10-CM | POA: Diagnosis not present

## 2022-09-12 DIAGNOSIS — J9611 Chronic respiratory failure with hypoxia: Secondary | ICD-10-CM | POA: Diagnosis not present

## 2022-09-12 DIAGNOSIS — I272 Pulmonary hypertension, unspecified: Secondary | ICD-10-CM

## 2022-09-12 DIAGNOSIS — I5033 Acute on chronic diastolic (congestive) heart failure: Secondary | ICD-10-CM

## 2022-09-12 DIAGNOSIS — G894 Chronic pain syndrome: Secondary | ICD-10-CM | POA: Diagnosis not present

## 2022-09-12 LAB — BASIC METABOLIC PANEL
Anion gap: 7 (ref 5–15)
BUN: 11 mg/dL (ref 8–23)
CO2: 29 mmol/L (ref 22–32)
Calcium: 8.7 mg/dL — ABNORMAL LOW (ref 8.9–10.3)
Chloride: 107 mmol/L (ref 98–111)
Creatinine, Ser: 0.8 mg/dL (ref 0.44–1.00)
GFR, Estimated: >60 mL/min (ref >60)
Glucose, Bld: 101 mg/dL — ABNORMAL HIGH (ref 70–99)
Potassium: 4 mmol/L (ref 3.5–5.1)
Sodium: 143 mmol/L (ref 135–145)

## 2022-09-12 LAB — CBC
HCT: 26.1 % — ABNORMAL LOW (ref 36.0–46.0)
Hemoglobin: 8.3 g/dL — ABNORMAL LOW (ref 12.0–15.0)
MCH: 30.9 pg (ref 26.0–34.0)
MCHC: 31.8 g/dL (ref 30.0–36.0)
MCV: 97 fL (ref 80.0–100.0)
Platelets: 275 10*3/uL (ref 150–400)
RBC: 2.69 MIL/uL — ABNORMAL LOW (ref 3.87–5.11)
RDW: 14.7 % (ref 11.5–15.5)
WBC: 9.1 10*3/uL (ref 4.0–10.5)
nRBC: 0.3 % — ABNORMAL HIGH (ref 0.0–0.2)

## 2022-09-12 MED ORDER — MAGNESIUM SULFATE 2 GM/50ML IV SOLN
2.0000 g | Freq: Once | INTRAVENOUS | Status: AC
  Administered 2022-09-12: 2 g via INTRAVENOUS
  Filled 2022-09-12: qty 50

## 2022-09-12 MED ORDER — AMIODARONE HCL 200 MG PO TABS
200.0000 mg | ORAL_TABLET | Freq: Two times a day (BID) | ORAL | Status: DC
  Administered 2022-09-12 – 2022-09-13 (×3): 200 mg via ORAL
  Filled 2022-09-12 (×3): qty 1

## 2022-09-12 NOTE — Progress Notes (Signed)
Occupational Therapy Treatment Patient Details Name: Beth [name] MRN: [id number] DOB: 07-Oct-1945 Today's Date: 09/12/2022   History of present illness Pt. is a 77 year old female with history of COPD on 3L of O2 at night, HTN, depression, anxiety, neuropathy, insomnia, atrial fibrillation, on Eliquis, who presents to the emergency department for chief concerns of a fall in the morning.  Left lateral malleolus acute fracture (non-surgical).  Transferred to ICU 09/07/22 d/t BP concerns.  SVT/Afib RVR morning of 09/09/22.   OT comments  Upon entering the room, pt supine in bed. She recently worked with PT but requesting assistance for toileting and willing to attempt OOB again. Supine >sit with mod A to EOB. Pt needing mod A for squat pivot transfer to [facility] with L LE remaining NWB. Pt able to void and performs hygiene with set up A while seated. Pt returning to bed in same manner as above. Pt appears fatigued and returning to supine with min A. Call bell and all needed items within reach. RN arrives as therapist is discussing pt call staff to assist her to [facility] vs using bed pan. Pt agrees to this plan. Pt continues to benefit from OT intervention.       If plan is discharge home, recommend the following:  A lot of help with walking and/or transfers;A lot of help with bathing/dressing/bathroom;Assistance with cooking/housework;Assist for transportation;Help with stairs or ramp for entrance;Direct supervision/assist for financial management;Direct supervision/assist for medications management   Equipment Recommendations  Other (comment) (defer to next venue of care)       Precautions / Restrictions Precautions Precautions: Fall Required Braces or Orthoses: Splint/Cast Splint/Cast: LLE splint/cast Restrictions Weight Bearing Restrictions: Yes LLE Weight Bearing: Non weight bearing       Mobility Bed Mobility Overal bed mobility: Needs Assistance Bed Mobility: Supine to Sit, Sit to Supine      Supine to sit: Mod assist Sit to supine: Min assist        Transfers Overall transfer level: Needs assistance Equipment used: 1 person hand held assist Transfers: Bed to chair/wheelchair/BSC     Squat pivot transfers: Mod assist             Balance Overall balance assessment: Needs assistance Sitting-balance support: No upper extremity supported, Feet supported Sitting balance-Leahy Scale: Good                                     ADL either performed or assessed with clinical judgement   ADL Overall ADL's : Needs assistance/impaired                         Toilet Transfer: Moderate assistance;BSC/3in1;Squat-pivot   Toileting- Clothing Manipulation and Hygiene: Sitting/lateral lean;Moderate assistance              Extremity/Trunk Assessment Upper Extremity Assessment Upper Extremity Assessment: Generalized weakness   Lower Extremity Assessment Lower Extremity Assessment: Generalized weakness        Vision Patient Visual Report: No change from baseline            Cognition Arousal: Alert Behavior During Therapy: WFL for tasks assessed/performed Overall Cognitive Status: Within Functional Limits for tasks assessed                                 General Comments: patient is cooperative throughout session.  sequencing cues required for functional tasks                   Pertinent Vitals/ Pain       Pain Assessment Pain Assessment: Faces Pain Score: 8  Faces Pain Scale: Hurts a little bit Pain Location: L ankle Pain Descriptors / Indicators: Sore, Discomfort Pain Intervention(s): Limited activity within patient's tolerance, Repositioned, Monitored during session         Frequency  Min 1X/week        Progress Toward Goals  OT Goals(current goals can now be found in the care plan section)  Progress towards OT goals: Progressing toward goals     Plan Discharge plan remains  appropriate;Frequency remains appropriate       AM-PAC OT "6 Clicks" Daily Activity     Outcome Measure   Help from another person eating meals?: None Help from another person taking care of personal grooming?: A Little Help from another person toileting, which includes using toliet, bedpan, or urinal?: A Lot Help from another person bathing (including washing, rinsing, drying)?: A Lot Help from another person to put on and taking off regular upper body clothing?: A Little Help from another person to put on and taking off regular lower body clothing?: A Lot 6 Click Score: 16    End of Session Equipment Utilized During Treatment: Oxygen  OT Visit Diagnosis: Other abnormalities of gait and mobility (R26.89);Muscle weakness (generalized) (M62.81);Repeated falls (R29.6);History of falling (Z91.81);Pain Pain - Right/Left: Left Pain - part of body: Ankle and joints of foot   Activity Tolerance Patient tolerated treatment well   Patient Left in bed;with bed alarm set;with call bell/phone within reach;with nursing/sitter in room   Nurse Communication Mobility status        Time: 1308-6578 OT Time Calculation (min): 20 min  Charges: OT General Charges $OT Visit: 1 Visit OT Treatments $Self Care/Home Management : 8-22 mins  Jackquline Denmark, MS, OTR/L , CBIS ascom [phone]  09/12/22, 2:54 PM

## 2022-09-12 NOTE — Progress Notes (Signed)
PROGRESS NOTE Beth [name]   ZOX:[id number] DOB: 26-Apr-1945  DOA: 09/05/2022 Date of Service: 09/12/22 PCP: Smitty Cords, DO  Brief Narrative / Hospital Course:  [name] is a 77 year old female with history of depression, anxiety, neuropathy, insomnia, atrial fibrillation, on Eliquis.  They presented to the emergency department for chief concerns of a fall in the morning 09/05/22 - Reports that she tripped over her oxygen cord and fell. She denies loss of consciousness. She denies head trauma. On 3L O2 at baseline. Typically wears at bedtime only. 08/08: to ED. Left lateral malleolus acute fracture that is minimally displaced. Admitted for ortho consult and pain control, may need SNF/STR. Per ortho (Dr Joice Lofts) plan for nonsurgical treatment w/ splint 08/09: PT/OT recs for SNF. Hypotensive, having to hold opiate pain medications, stopped beta blocker and lasix, and reduced gabapentin, melatonin  08/10: Concern for UTI w/ dysuria and abn UA, starting Rocephin. Cr up slightly, BP lower, scheduled midodrine, 1L NS bolus and BP improved only slightly. MAP <60. Hgb trending down but no s/s bleeding, checked again and trending better. No s/s infection other than UTI and not septic. Bradycardic. Holding opiates, reduced or d/c other potentially sedating meds. Given borderline BP not responding to fluids/midodrine and no clear reason for hypotension other than clinically a bit dry and of course polypharmayc, spoke w/ ICU and will transfer to stepdown for closer monitoring. Briefly was on pressors, but SBP improved >100 and patient was mentating ok, per PCCU suspect that low diastolic drives down her MAP / causes it to be artificially low. PCCU also agrees that frailty and recent injury w/ lack of reserve are most likely cause for low BP/weakness.  08/11: BP somewhat better though diastolic remains low, did not require pressors overnight. PCCM s/o.  08/12: BP improved. Hgb remains low  8.5. This AM SVT/Afib RVR, improved w/ diltiazem and on gtt, echo ordered, heparin gtt since she's been off Eliquis d/t anemia and in case needing any procedures / reversal. Cardiology consult. Converted to sinus this afternoon. Echo EF 70-75%, diastolic fxn uncertain, severely elevated pulm artery pressure   08/13: BP improved, per cardio ok to d/c midodrine so will taper off today and probably can stop tomorrow if BP remains ok, resume Lasix for pulm HTN,  8/14-present: stable and awaiting dc to SNF. On baseline oxygen saturation. Continues to work with PT/OT. TOC is engaged in her SNF placement process which is delayed due to insurance issue (see CSW note from 8/14 for further detail).     Consultants:  Orthopedics - ankle fracture  PCCM - hypotension   Procedures: none   ASSESSMENT & PLAN: Acute Left lateral malleolus acute fracture, minimally displaced Mechanical Fall  Orthopedics consulted- conservative nonsurgical management Outpatient f/u Pain control Home Gabapentin 900 mg tid --> 600 tid --> 300 tid Oxycodone low dose if BP ok (SBP >110) Naproxen 500 mg bid ac Tylenol PRN  Afib RVR- s/p dilt gtt. Now rate controlled on metoprolol  Continue metoprolol tartrate 12.5 mg bid. BP is tolerating.  Continue eliquis consider EP consult for Watchman as outpatient  Elevated TSH- 7.029 this admission. May contribute to hard to manage Afib.  - start conservative dose levothyroxine and recheck in outpatient setting out of acute illness  H/o CVA with mild residual L hemiparesis- contributed to initial fall. Patient states she is at her baseline strength and function currently from that previous stroke. Increased fall risk now with ankle fracture  - continue PT/OT  Essential HTN, but hypotensive here Home furosemide and metoprolol were then held d/t low BP --> 8/12 restart metoprolol following Afib RVR,  8/13 restart furosemide 20 mg IV BID Weaned off midodrine, hypotension has  resolved.   UTI- s/p treatment   AKI - resolved Likely poor po intake +/- UTI Trend BMP   HLD (hyperlipidemia) Home atorvastatin 40 mg nightly held d/t liver enzymes but this trending down, can likely resume at discharge / outpatient    COPD/Asthma  Pulmonary hypertension (HCC)- stable at baseline  Albuterol prn Breo + Incruse, Singulair   HFpEF  Patient wears 3 L [place] continuously Continue O2 support  Continue home furosemide 20 mg IV BID; titration as needed.   Chronic pain syndrome Pain control for ankle fracture - see above   Major depressive disorder, recurrent, severe w/o psychotic behavior (HCC) Anxiety and depression Home quetiapine 250 mg (confirmed filled with pharmacy) at bedtime held home fluoxetine 20 mg dialy resumed    Hypokalemia Replace as needed Monitor BMP    DVT prophylaxis: eliquis Pertinent IV fluids/nutrition: no continuous IV fluids  Central lines / invasive devices: none    Code Status: FULL CODE ACP documentation reviewed   Current Admission Status: inpatient   TOC needs / Dispo plan: SNF STR Barriers to discharge / significant pending items: placement, insurance clearance   Subjective / Brief ROS:  Pt reports feeling well today. She is ready to leave the hospital and upset about the insurance issues preventing her transfer. Denies pain at rest.   Family Communication: none at bedside  Objective Findings: Blood pressure (!) 115/51, pulse 74, temperature 98.3 F (36.8 C), temperature source Oral, resp. rate 20, height 5\' 5"  (1.651 m), weight 65 kg, SpO2 90%.  Examination:  Physical Exam Constitutional:      General: She is not in acute distress.    Appearance: She is normal weight. She is not ill-appearing or toxic-appearing.  HENT:     Mouth/Throat:     Mouth: Mucous membranes are moist.  Cardiovascular:     Rate and Rhythm: Normal rate and regular rhythm.     Heart sounds: Normal heart sounds. No murmur heard. Pulmonary:      Effort: Pulmonary effort is normal.     Breath sounds: Normal breath sounds.  Abdominal:     General: Abdomen is flat.     Palpations: Abdomen is soft.     Tenderness: There is no abdominal tenderness.  Musculoskeletal:     Right lower leg: No edema.     Left lower leg: No edema.  Skin:    General: Skin is warm and dry.     Capillary Refill: Capillary refill takes less than 2 seconds.     Findings: No rash.  Neurological:     General: No focal deficit present.     Mental Status: She is alert and oriented to person, place, and time.  Psychiatric:        Mood and Affect: Mood normal.        Behavior: Behavior normal.  Data Reviewed:  I have personally reviewed the following...  CBC: Recent Labs  Lab 09/05/22 1502 09/06/22 0610 09/07/22 0449 09/07/22 1349 09/09/22 0352 09/10/22 0641 09/11/22 0509 09/12/22 0606  WBC 12.0*   < > 8.9  --  8.9 10.5 9.5 9.1  NEUTROABS 9.8*  --  4.8  --   --   --   --   --   HGB 10.8*   < > 8.8* 9.3*  8.5* 9.3* 8.0* 8.3*  HCT 33.9*   < > 26.9* 28.6* 26.1* 29.6* 24.2* 26.1*  MCV 96.9   < > 96.1  --  96.7 97.7 94.9 97.0  PLT 325   < > 289  --  304 290 268 275   < > = values in this interval not displayed.   Basic Metabolic Panel: Recent Labs  Lab 09/07/22 0449 09/08/22 0510 09/08/22 0514 09/09/22 0352 09/10/22 0641 09/11/22 0509 09/12/22 0606  NA 140  --  140 144 143 141 143  K 3.2*  --  3.2* 3.7 4.1 3.2* 4.0  CL 105  --  109 112* 109 105 107  CO2 29  --  24 26 26 30 29   GLUCOSE 100*  --  92 99 117* 112* 101*  BUN 26*  --  20 16 13 13 11   CREATININE 1.19*  --  0.74 0.79 0.75 0.77 0.80  CALCIUM 8.7*  --  8.5* 8.7* 9.2 8.8* 8.7*  MG 1.9 1.7  --   --   --   --   --    GFR: Estimated Creatinine Clearance: 52.2 mL/min (by C-G formula based on SCr of 0.8 mg/dL). Liver Function Tests: Recent Labs  Lab 09/05/22 1502 09/06/22 0610 09/07/22 0449 09/09/22 0352 09/10/22 0641  AST 360* 201* 93* 35 27  ALT 241* 180* 118* 64* 55*   ALKPHOS 297* 243* 168* 129* 137*  BILITOT 1.6* 1.3* 0.6 0.8 0.5  PROT 6.6 6.2* 5.6* 5.4* 6.4*  ALBUMIN 3.1* 2.8* 2.3* 2.4* 2.7*   Thyroid Function Tests: No results for input(s): "TSH", "T4TOTAL", "FREET4", "T3FREE", "THYROIDAB" in the last 72 hours.  Radiology Studies last 3 days: ECHOCARDIOGRAM COMPLETE  Result Date: 09/09/2022    ECHOCARDIOGRAM REPORT   Patient Name:   Beth [name] Date of Exam: 09/09/2022 Medical Rec #:  [id number]        Height:       65.0 in Accession #:    [id number]       Weight:       144.2 lb Date of Birth:  Jan 26, 1946        BSA:          1.721 m Patient Age:    76 years         BP:           126/64 mmHg Patient Gender: F                HR:           64 bpm. Exam Location:  [facility] Procedure: 2D Echo, Cardiac Doppler and Color Doppler Indications:     Atrial Fibrillation I48.91  History:         Patient has prior history of Echocardiogram examinations, most                  recent 08/26/2022. COPD; Risk Factors:Hypertension and                  Dyslipidemia.  Sonographer:     Cristela Blue Referring Phys:  [id number] Sunnie Nielsen Diagnosing Phys: Yvonne Kendall MD IMPRESSIONS  1. Left ventricular ejection fraction, by estimation, is 70 to 75%. The left ventricle has hyperdynamic function. Left ventricular endocardial border not optimally defined to evaluate regional wall motion. There is mild left ventricular hypertrophy. Left ventricular diastolic function could not be evaluated.  2. Pulmonary artery pressure is moderately to severely elevated (RVSP 50-55 mmHg plus central venous/right atrial pressure).Marland Kitchen  Right ventricular systolic function is normal. The right ventricular size is normal.  3. The mitral valve is degenerative. Mild mitral valve regurgitation. No evidence of mitral stenosis. Moderate mitral annular calcification.  4. Tricuspid valve regurgitation is moderate to severe.  5. The aortic valve was not well visualized. Aortic valve regurgitation is not visualized. No  aortic stenosis is present.  6. Pulmonic valve regurgitation not well-assessed. FINDINGS  Left Ventricle: Left ventricular ejection fraction, by estimation, is 70 to 75%. The left ventricle has hyperdynamic function. Left ventricular endocardial border not optimally defined to evaluate regional wall motion. The left ventricular internal cavity size was normal in size. There is mild left ventricular hypertrophy. Left ventricular diastolic function could not be evaluated due to atrial fibrillation. Left ventricular diastolic function could not be evaluated. Right Ventricle: Pulmonary artery pressure is moderately to severely elevated (RVSP 50-55 mmHg plus central venous/right atrial pressure). The right ventricular size is normal. Right vetricular wall thickness was not well visualized. Right ventricular systolic function is normal. Left Atrium: Left atrial size was normal in size. Right Atrium: Right atrial size was normal in size. Pericardium: Trivial pericardial effusion is present. Mitral Valve: The mitral valve is degenerative in appearance. There is mild thickening of the mitral valve leaflet(s). Moderate mitral annular calcification. Mild mitral valve regurgitation. No evidence of mitral valve stenosis. Tricuspid Valve: The tricuspid valve is not well visualized. Tricuspid valve regurgitation is moderate to severe. Aortic Valve: The aortic valve was not well visualized. Aortic valve regurgitation is not visualized. No aortic stenosis is present. Aortic valve mean gradient measures 5.0 mmHg. Aortic valve peak gradient measures 7.2 mmHg. Aortic valve area, by VTI measures 2.64 cm. Pulmonic Valve: The pulmonic valve was not well visualized. Pulmonic valve regurgitation not well-assessed. Aorta: The aortic root is normal in size and structure. Pulmonary Artery: The pulmonary artery is not well seen. Venous: The inferior vena cava was not well visualized. IAS/Shunts: The interatrial septum was not well visualized.   LEFT VENTRICLE PLAX 2D LVIDd:         3.80 cm LVIDs:         2.30 cm LV PW:         1.10 cm LV IVS:        1.30 cm LVOT diam:     2.00 cm LV SV:         59 LV SV Index:   34 LVOT Area:     3.14 cm  RIGHT VENTRICLE RV Basal diam:  2.90 cm RV Mid diam:    2.60 cm LEFT ATRIUM             Index        RIGHT ATRIUM           Index LA diam:        4.30 cm 2.50 cm/m   RA Area:     12.80 cm LA Vol (A2C):   54.7 ml 31.78 ml/m  RA Volume:   28.70 ml  16.67 ml/m LA Vol (A4C):   36.7 ml 21.32 ml/m LA Biplane Vol: 48.6 ml 28.23 ml/m  AORTIC VALVE AV Area (Vmax):    2.28 cm AV Area (Vmean):   2.11 cm AV Area (VTI):     2.64 cm AV Vmax:           134.00 cm/s AV Vmean:          102.200 cm/s AV VTI:  0.222 m AV Peak Grad:      7.2 mmHg AV Mean Grad:      5.0 mmHg LVOT Vmax:         97.40 cm/s LVOT Vmean:        68.700 cm/s LVOT VTI:          0.187 m LVOT/AV VTI ratio: 0.84  AORTA Ao Root diam: 2.80 cm MITRAL VALVE                TRICUSPID VALVE MV Area (PHT): 5.20 cm     TR Peak grad:   53.6 mmHg MV Decel Time: 146 msec     TR Vmax:        366.00 cm/s MV E velocity: 136.00 cm/s                             SHUNTS                             Systemic VTI:  0.19 m                             Systemic Diam: 2.00 cm Yvonne Kendall MD Electronically signed by Yvonne Kendall MD Signature Date/Time: 09/09/2022/4:25:14 PM    Final       LOS: 7 days    Leeroy Bock, DO [facility] 09/12/2022, 8:02 AM   If 7PM-7AM, please contact night coverage [email or web address]

## 2022-09-12 NOTE — Progress Notes (Signed)
Rounding Note    Patient Name: Beth [name] Date of Encounter: 09/12/2022  [facility] Cardiologist: Debbe Odea, MD   Subjective   Reports her breathing is "a little labored." Reports paroxysms of tachypalpitations about once per month. No chest pain. No documented UOP for the admission. Net + 5.3 L for the admission. Hgb low, though stable at 8.3 on Eliquis.   Inpatient Medications    Scheduled Meds:  apixaban  5 mg Oral BID   Chlorhexidine Gluconate Cloth  6 each Topical Q0600   FLUoxetine  20 mg Oral Daily   fluticasone furoate-vilanterol  1 puff Inhalation Daily   And   umeclidinium bromide  1 puff Inhalation Daily   furosemide  40 mg Intravenous BID   gabapentin  100 mg Oral TID   levothyroxine  25 mcg Oral Q0600   melatonin  5 mg Oral QHS   metoprolol tartrate  12.5 mg Oral BID   montelukast  10 mg Oral QHS   traZODone  50 mg Oral QHS   Continuous Infusions:  sodium chloride Stopped (09/09/22 0940)   PRN Meds: acetaminophen, albuterol, oxyCODONE, polyethylene glycol   Vital Signs    Vitals:   09/11/22 1943 09/11/22 2334 09/12/22 0410 09/12/22 0840  BP: (!) 125/48 (!) 116/49 135/66 (!) 121/54  Pulse: 72 73 76 71  Resp: 20 19 17    Temp: 98.5 F (36.9 C) 98.3 F (36.8 C) 98.7 F (37.1 C) 98.2 F (36.8 C)  TempSrc: Oral Oral Oral Oral  SpO2: 96% 95% 90% 91%  Weight:      Height:        Intake/Output Summary (Last 24 hours) at 09/12/2022 0901 Last data filed at 09/11/2022 1027 Gross per 24 hour  Intake 120 ml  Output --  Net 120 ml      09/11/2022    5:38 AM 09/07/2022    4:59 PM 09/05/2022   12:49 PM  Last 3 Weights  Weight (lbs) 143 lb 4.8 oz 144 lb 2.9 oz 133 lb  Weight (kg) 65 kg 65.4 kg 60.328 kg      Telemetry    Sinus in the 70s bpm - Personally Reviewed  ECG    No new tracings - Personally Reviewed  Physical Exam   GEN: No acute distress.   Neck: JVD mildly elevated. Cardiac: RRR, I/VI systolic murmur  without rubs or gallops.  Respiratory: Mildly diminished to auscultation bilaterally.  Respirations are unlabored at rest on 3 L of O2 via nasal cannula. GI: Soft, nontender, non-distended  MS: Trace edema to RLE, LLE splinted; No deformity. Neuro:  Nonfocal. Psych: Normal affect.   Labs    High Sensitivity Troponin:   Recent Labs  Lab 08/30/22 1530 08/30/22 1732 09/05/22 1502 09/05/22 1712 09/07/22 1533  TROPONINIHS 10 10 14 16 9      Chemistry Recent Labs  Lab 09/07/22 0449 09/08/22 0510 09/08/22 0514 09/09/22 0352 09/10/22 0641 09/11/22 0509 09/12/22 0606  NA 140  --    < > 144 143 141 143  K 3.2*  --    < > 3.7 4.1 3.2* 4.0  CL 105  --    < > 112* 109 105 107  CO2 29  --    < > 26 26 30 29   GLUCOSE 100*  --    < > 99 117* 112* 101*  BUN 26*  --    < > 16 13 13 11   CREATININE 1.19*  --    < >  0.79 0.75 0.77 0.80  CALCIUM 8.7*  --    < > 8.7* 9.2 8.8* 8.7*  MG 1.9 1.7  --   --   --   --   --   PROT 5.6*  --   --  5.4* 6.4*  --   --   ALBUMIN 2.3*  --   --  2.4* 2.7*  --   --   AST 93*  --   --  35 27  --   --   ALT 118*  --   --  64* 55*  --   --   ALKPHOS 168*  --   --  129* 137*  --   --   BILITOT 0.6  --   --  0.8 0.5  --   --   GFRNONAA 47*  --    < > >60 >60 >60 >60  ANIONGAP 6  --    < > 6 8 6 7    < > = values in this interval not displayed.    Lipids No results for input(s): "CHOL", "TRIG", "HDL", "LABVLDL", "LDLCALC", "CHOLHDL" in the last 168 hours.  Hematology Recent Labs  Lab 09/10/22 0641 09/11/22 0509 09/12/22 0606  WBC 10.5 9.5 9.1  RBC 3.03* 2.55* 2.69*  HGB 9.3* 8.0* 8.3*  HCT 29.6* 24.2* 26.1*  MCV 97.7 94.9 97.0  MCH 30.7 31.4 30.9  MCHC 31.4 33.1 31.8  RDW 14.6 14.6 14.7  PLT 290 268 275   Thyroid  Recent Labs  Lab 09/09/22 0352  TSH 7.029*    BNPNo results for input(s): "BNP", "PROBNP" in the last 168 hours.  DDimer No results for input(s): "DDIMER" in the last 168 hours.   Radiology    No results found.  Cardiac  Studies   TTE 08/26/22 1. Left ventricular ejection fraction, by estimation, is 55 to 60%. The  left ventricle has normal function. The left ventricle has no regional  wall motion abnormalities. There is mild left ventricular hypertrophy.  Left ventricular diastolic parameters  are indeterminate.   2. Right ventricular systolic function is normal. The right ventricular  size is normal. There is severely elevated pulmonary artery systolic  pressure.   3. Left atrial size was mild to moderately dilated.   4. Right atrial size was mildly dilated.   5. The mitral valve is normal in structure. Mild mitral valve  regurgitation. No evidence of mitral stenosis.   6. Tricuspid valve regurgitation is moderate.   7. The aortic valve is normal in structure. Aortic valve regurgitation is  not visualized. Aortic valve sclerosis/calcification is present, without  any evidence of aortic stenosis.   8. The inferior vena cava is normal in size with <50% respiratory  variability, suggesting right atrial pressure of 8 mmHg.    TTE with bubble study 03/24/22 1. Left ventricular ejection fraction, by estimation, is 60 to 65%. The  left ventricle has normal function. The left ventricle has no regional  wall motion abnormalities. Left ventricular diastolic parameters were  normal.   2. Right ventricular systolic function is normal. The right ventricular  size is normal.   3. The mitral valve is normal in structure. Mild to moderate mitral valve  regurgitation. No evidence of mitral stenosis.   4. Tricuspid valve regurgitation is moderate to severe.   5. The aortic valve is normal in structure. Aortic valve regurgitation is  mild. No aortic stenosis is present.   6. The inferior vena cava is normal in size  with greater than 50%  respiratory variability, suggesting right atrial pressure of 3 mmHg.   7. Agitated saline contrast bubble study was negative, with no evidence  of any interatrial shunt.    Patient Profile     77 y.o. female with a past medical history of paroxysmal atrial fibrillation on chronic apixaban, depression/anxiety, neuropathy, insomnia, asthma, HFpEF, CVA, COPD, former smoker, hypertension, hyperlipidemia who is admitted on 09/09/2019 was been seen and evaluated for atrial fibrillation RVR.  Assessment & Plan    Paroxysmal atrial fibrillation: -Query if paroxysms of atrial arrhythmia are contributing to her symptoms -Currently maintaining sinus rhythm -Presented to the [facility] emergency department A-fib RVR converted with IV diltiazem -has been transitioned from heparin gtt to Eliquis, monitor Hgb, low threshold to hold OAC if anemia progresses  -May need to consider Watchman in the outpatient setting  -Continue on metoprolol, BP precludes tritration -Add amiodarone 200 mg bid for 2 weeks then 200 mg daily thereafter (monitor QT and maintain potassium and magnesium at goal 4.0 and 2.0 respectively) -Continue with telemetry monitoring  HFpEF with pulmonary hypertension: -Patient continues to complain of dyspnea and chest tightness -JVP remains elevated -Moderate to severe pulmonary hypertension on echo this admission last month -Patient transitioned back to IV Lasix 40 mg bid on 8/14, which should be continued for now -I/O inaccurate (no documented UOP) -Not a good candidate for SGLT2 inhibitor with recurrent UTIs -Consider addition of MRA prior to discharge or in follow up  Atypical chest pain: -Nonspecific without EKG changes -Echo with preserved LVSF and no wall motion abnormalities -Troponin negative x 3 -Patient is currently not a good candidate for invasive procedures like cardiac catheterization due to her comorbidities, including anemia, and recurrent falls -Continued on metoprolol for antianginal therapy as blood pressure allows -Can consider pharmacologic MPI if chest pain persist once patient has had adequate diuresis -Blood Transfusion may help with  the shortness of breath and chest discomfort as well -Can consider long-acting nitrates if chest pain continues  Anemia -Hemoglobin low, though stable at 8.3, on arrival she was 10.8, prior to starting apixaban she was 11.2 -No active bleeding noted -FOBT still to be obtained -Daily CBC -Recommend transfusing to keep hemoglobin greater than 8 if needed, continued chest discomfort patient may benefit from blood transfusion  Hypokalemia -Repleted  COPD with chronic respiratory failure with hypoxia -Maintaining oxygen saturations on 3 L of O2 via nasal cannula -Bronchodilators -Supportive care  UTI -Continued management per primary team  Left ankle fracture -Management per orthopedics -Ankle remains splinted  Chronic pain -Per primary team     For questions or updates, please contact  [facility] Please consult [email or web address] for contact info under        Signed, Eula Listen, PA-C  09/12/2022, 9:01 AM

## 2022-09-12 NOTE — Patient Outreach (Signed)
  Care Coordination   Initial Visit Note   09/12/2022 Name: Beth [name] MRN: [id number] DOB: 08-28-1945  Beth [name] is a 77 y.o. year old female who sees Smitty Cords, DO for primary care. I spoke with  Beth [name] by phone today.  What matters to the patients health and wellness today?  Placement in short term rehab    Goals Addressed             This Visit's Progress    care coordination activities       Interventions Today    Flowsheet Row Most Recent Value  Chronic Disease   Chronic disease during today's visit Other  [chronic pain]  General Interventions   General Interventions Discussed/Reviewed General Interventions Discussed, General Interventions Reviewed, Level of Care, Communication with  Communication with Social Work  [TOC Child psychotherapist who confirms follow up with Medicare to close claims for placement in short term rehab]  Level of Care --  [pt currently hospitalized, recommended for short term rehab, bed identified auth obtained by [facility] however open claims will need to closed before pt can admit.Per claims should be closed, Medicare contacted who suggested that [facility] also be contacted]              SDOH assessments and interventions completed:  Yes  SDOH Interventions Today    Flowsheet Row Most Recent Value  SDOH Interventions   Food Insecurity Interventions Intervention Not Indicated  Housing Interventions Intervention Not Indicated  Transportation Interventions Intervention Not Indicated        Care Coordination Interventions:  Yes, provided   Follow up plan: Follow up call scheduled for 09/13/22    Encounter Outcome:  Pt. Visit Completed

## 2022-09-12 NOTE — Plan of Care (Signed)

## 2022-09-12 NOTE — TOC Progression Note (Signed)
Transition of Care [facility]) - Progression Note    Patient Details  Name: WIKTORIA HINO MRN: [id number] Date of Birth: March 11, 1945  Transition of Care [facility]) CM/SW Contact  Darleene Cleaver, [place] Phone Number: 09/12/2022, 10:20 AM  Clinical Narrative:     CSW spoke to admissions worker Ricky at [employer].  Per Clide Cliff patient has a liability claim in the past, which prevents Medicare from paying for stay at [facility].  Clide Cliff stated he was going to call family to see if he can get confirmation that the liability claim has been closed and if they can tell him the name of the company who can confirm it has been closed, so family can update Medicare.  Per TOC email, Medicare states it can take 10-20 days to get the liability claim removed.  Patient has been approved for SNF placement, pending removal of liability claim.  TOC to continue to follow patient's progress throughout discharge planning.     Expected Discharge Plan: Skilled Nursing Facility Barriers to Discharge: Continued Medical Work up  Expected Discharge Plan and Services   Discharge Planning Services: CM Consult Post Acute Care Choice: Home Health, Skilled Nursing Facility Living arrangements for the past 2 months: Apartment                   DME Agency: NA                   Social Determinants of Health (SDOH) Interventions SDOH Screenings   Food Insecurity: No Food Insecurity (09/05/2022)  Housing: Low Risk  (09/05/2022)  Transportation Needs: No Transportation Needs (09/05/2022)  Utilities: Not At Risk (09/05/2022)  Alcohol Screen: Low Risk  (02/01/2022)  Depression (PHQ2-9): High Risk (05/13/2022)  Financial Resource Strain: Low Risk  (02/01/2022)  Physical Activity: Insufficiently Active (02/01/2022)  Social Connections: Socially Isolated (02/01/2022)  Stress: No Stress Concern Present (02/01/2022)  Tobacco Use: High Risk (09/05/2022)    Readmission Risk Interventions    09/09/2022   12:06 PM  Readmission Risk  Prevention Plan  Transportation Screening Complete  Medication Review (RN Care Manager) Referral to Pharmacy  PCP or Specialist appointment within 3-5 days of discharge Complete  HRI or Home Care Consult Complete  SW Recovery Care/Counseling Consult Complete  Palliative Care Screening Complete  Skilled Nursing Facility Complete

## 2022-09-12 NOTE — Patient Instructions (Signed)
Visit Information  Thank you for taking time to visit with me today. Please don't hesitate to contact me if I can be of assistance to you.   Please continue to follow up with Medicare to close any open claims.  Our next appointment is by telephone on 09/13/22 at 2pm  Please call the care guide team at [phone] if you need to cancel or reschedule your appointment.   If you are experiencing a Mental Health or Behavioral Health Crisis or need someone to talk to, please call 911   Patient verbalizes understanding of instructions and care plan provided today and agrees to view in MyChart. Active MyChart status and patient understanding of how to access instructions and care plan via MyChart confirmed with patient.     Telephone follow up appointment with care management team member scheduled for:09/13/22  Verna Czech, [place] Clinical Social Worker  [facility] Care Management [phone]

## 2022-09-12 NOTE — TOC Progression Note (Signed)
Transition of Care [facility]) - Progression Note    Patient Details  Name: Beth [name] MRN: [id number] Date of Birth: 1945/02/07  Transition of Care [facility]) CM/SW Contact  Darolyn Rua, [place] Phone Number: 09/12/2022, 2:51 PM  Clinical Narrative:     CSW received call from [employer] reporting they found documentation from 2001 from a medicare letter reporting the liability was closed and they state they have additional supporting documentation and settlement information. They will be emailing documents by close of business today for CSW to provide to [employer] [facility] and to Medicare.    Expected Discharge Plan: Skilled Nursing Facility Barriers to Discharge: Continued Medical Work up  Expected Discharge Plan and Services   Discharge Planning Services: CM Consult Post Acute Care Choice: Home Health, Skilled Nursing Facility Living arrangements for the past 2 months: Apartment                   DME Agency: NA                   Social Determinants of Health (SDOH) Interventions SDOH Screenings   Food Insecurity: No Food Insecurity (09/05/2022)  Housing: Low Risk  (09/05/2022)  Transportation Needs: No Transportation Needs (09/05/2022)  Utilities: Not At Risk (09/05/2022)  Alcohol Screen: Low Risk  (02/01/2022)  Depression (PHQ2-9): High Risk (05/13/2022)  Financial Resource Strain: Low Risk  (02/01/2022)  Physical Activity: Insufficiently Active (02/01/2022)  Social Connections: Socially Isolated (02/01/2022)  Stress: No Stress Concern Present (02/01/2022)  Tobacco Use: High Risk (09/05/2022)    Readmission Risk Interventions    09/09/2022   12:06 PM  Readmission Risk Prevention Plan  Transportation Screening Complete  Medication Review (RN Care Manager) Referral to Pharmacy  PCP or Specialist appointment within 3-5 days of discharge Complete  HRI or Home Care Consult Complete  SW Recovery Care/Counseling Consult Complete  Palliative Care Screening Complete  Skilled Nursing  Facility Complete

## 2022-09-12 NOTE — Progress Notes (Signed)
Physical Therapy Treatment Patient Details Name: Beth [name] MRN: [id number] DOB: 1945/08/11 Today's Date: 09/12/2022   History of Present Illness Pt. is a 77 year old female with history of COPD on 3L of O2 at night, HTN, depression, anxiety, neuropathy, insomnia, atrial fibrillation, on Eliquis, who presents to the emergency department for chief concerns of a fall in the morning.  Left lateral malleolus acute fracture (non-surgical).  Transferred to ICU 09/07/22 d/t BP concerns.  SVT/Afib RVR morning of 09/09/22.    PT Comments  Patient was agreeable to PT session and reports minimal pain in the L ankle. Transfer training continued today. She continues to require assistance with improvement in independence and safety with use of rolling walker compared to no rolling walker. Seated rest breaks required due to fatigue with minimal activity with mild dizziness reported initially with upright activity. Standing tolerance of no more than 15 seconds. Recommend to continue PT to maximize independence and decrease caregiver burden.    If plan is discharge home, recommend the following: A lot of help with walking and/or transfers;A lot of help with bathing/dressing/bathroom;Assist for transportation;Help with stairs or ramp for entrance;Assistance with cooking/housework   Can travel by private vehicle     No  Equipment Recommendations  Wheelchair (measurements PT);Wheelchair cushion (measurements PT)    Recommendations for Other Services       Precautions / Restrictions Precautions Precautions: Fall Restrictions Weight Bearing Restrictions: Yes LLE Weight Bearing: Non weight bearing     Mobility  Bed Mobility Overal bed mobility: Needs Assistance Bed Mobility: Supine to Sit, Sit to Supine     Supine to sit: Mod assist Sit to supine: Min assist   General bed mobility comments: assistance for LLE and trunk support for supine to sit. assistance for LLE support to return to supine.  verbal cues for sequencing. increased time and effort required    Transfers Overall transfer level: Needs assistance Equipment used: Rolling walker (2 wheels) Transfers: Bed to chair/wheelchair/BSC   Stand pivot transfers: Mod assist   Squat pivot transfers: Max assist     General transfer comment: verbal cues for technique using rolling walker for support. with first transfer, patient perfers to perform squat pivot without use of rolling walker and required increased assistance. with rolling walker, patient was able to fully stand and keep weight off LLE with transfer with cues for technique.    Ambulation/Gait             Pre-gait activities: cues for upright standing posture, hand placement on rolling walker, and keeping weight off LLE in preparation for ambulation attempts. mild dizziness reported with standing tolerance of 15 seconds General Gait Details: unable to due to poor standing tolerance and fatigue with minimal activity   Stairs             Wheelchair Mobility     Tilt Bed    Modified Rankin (Stroke Patients Only)       Balance Overall balance assessment: Needs assistance Sitting-balance support: No upper extremity supported, Feet supported Sitting balance-Leahy Scale: Good     Standing balance support: Reliant on assistive device for balance, Bilateral upper extremity supported Standing balance-Leahy Scale: Poor Standing balance comment: external support required to maintain standing balance with rolling walker for support                            Cognition Arousal: Alert Behavior During Therapy: WFL for tasks assessed/performed Overall Cognitive Status: Within  Functional Limits for tasks assessed                                 General Comments: patient is cooperative throughout session. sequencing cues required for functional tasks        Exercises      General Comments        Pertinent Vitals/Pain  Pain Assessment Pain Assessment: Faces Faces Pain Scale: Hurts a little bit Pain Location: L ankle Pain Descriptors / Indicators: Sore, Discomfort Pain Intervention(s): Limited activity within patient's tolerance, Monitored during session, Repositioned    Home Living                          Prior Function            PT Goals (current goals can now be found in the care plan section) Acute Rehab PT Goals Patient Stated Goal: to improve mobility PT Goal Formulation: With patient Time For Goal Achievement: 09/24/22 Potential to Achieve Goals: Good Progress towards PT goals: Progressing toward goals    Frequency    Min 1X/week      PT Plan      Co-evaluation              AM-PAC PT "6 Clicks" Mobility   Outcome Measure  Help needed turning from your back to your side while in a flat bed without using bedrails?: A Little Help needed moving from lying on your back to sitting on the side of a flat bed without using bedrails?: A Lot Help needed moving to and from a bed to a chair (including a wheelchair)?: A Lot Help needed standing up from a chair using your arms (e.g., wheelchair or bedside chair)?: A Lot Help needed to walk in hospital room?: Total Help needed climbing 3-5 steps with a railing? : Total 6 Click Score: 11    End of Session Equipment Utilized During Treatment: Gait belt;Oxygen Activity Tolerance: Patient limited by fatigue Patient left: in bed;with call bell/phone within reach;with bed alarm set   PT Visit Diagnosis: Unsteadiness on feet (R26.81);History of falling (Z91.81);Difficulty in walking, not elsewhere classified (R26.2);Muscle weakness (generalized) (M62.81);Pain;Other abnormalities of gait and mobility (R26.89) Pain - Right/Left: Left Pain - part of body: Ankle and joints of foot     Time: 3244-0102 PT Time Calculation (min) (ACUTE ONLY): 23 min  Charges:    $Therapeutic Activity: 23-37 mins PT General Charges $$ ACUTE  PT VISIT: 1 Visit                     Donna Bernard, PT, MPT    Ina Homes 09/12/2022, 11:42 AM

## 2022-09-13 ENCOUNTER — Emergency Department
Admission: EM | Admit: 2022-09-13 | Discharge: 2022-09-14 | Disposition: A | Discharge status: Home / Self Care | Payer: 59 | Attending: Emergency Medicine | Admitting: Emergency Medicine

## 2022-09-13 ENCOUNTER — Other Ambulatory Visit: Payer: Self-pay

## 2022-09-13 ENCOUNTER — Ambulatory Visit: Payer: Self-pay | Admitting: *Deleted

## 2022-09-13 ENCOUNTER — Emergency Department: Payer: 59

## 2022-09-13 DIAGNOSIS — R279 Unspecified lack of coordination: Secondary | ICD-10-CM | POA: Diagnosis not present

## 2022-09-13 DIAGNOSIS — R262 Difficulty in walking, not elsewhere classified: Secondary | ICD-10-CM | POA: Diagnosis not present

## 2022-09-13 DIAGNOSIS — R531 Weakness: Secondary | ICD-10-CM | POA: Insufficient documentation

## 2022-09-13 DIAGNOSIS — J439 Emphysema, unspecified: Secondary | ICD-10-CM | POA: Diagnosis not present

## 2022-09-13 DIAGNOSIS — I5032 Chronic diastolic (congestive) heart failure: Secondary | ICD-10-CM | POA: Diagnosis not present

## 2022-09-13 DIAGNOSIS — R0602 Shortness of breath: Secondary | ICD-10-CM | POA: Diagnosis not present

## 2022-09-13 DIAGNOSIS — M79662 Pain in left lower leg: Secondary | ICD-10-CM | POA: Diagnosis not present

## 2022-09-13 DIAGNOSIS — E785 Hyperlipidemia, unspecified: Secondary | ICD-10-CM | POA: Diagnosis not present

## 2022-09-13 DIAGNOSIS — Z741 Need for assistance with personal care: Secondary | ICD-10-CM | POA: Diagnosis not present

## 2022-09-13 DIAGNOSIS — E876 Hypokalemia: Secondary | ICD-10-CM | POA: Diagnosis not present

## 2022-09-13 DIAGNOSIS — Z7401 Bed confinement status: Secondary | ICD-10-CM | POA: Diagnosis not present

## 2022-09-13 DIAGNOSIS — U071 COVID-19: Secondary | ICD-10-CM | POA: Diagnosis not present

## 2022-09-13 DIAGNOSIS — N390 Urinary tract infection, site not specified: Secondary | ICD-10-CM | POA: Diagnosis not present

## 2022-09-13 DIAGNOSIS — E038 Other specified hypothyroidism: Secondary | ICD-10-CM

## 2022-09-13 DIAGNOSIS — S82892A Other fracture of left lower leg, initial encounter for closed fracture: Secondary | ICD-10-CM | POA: Diagnosis not present

## 2022-09-13 DIAGNOSIS — R7989 Other specified abnormal findings of blood chemistry: Secondary | ICD-10-CM | POA: Diagnosis not present

## 2022-09-13 DIAGNOSIS — R509 Fever, unspecified: Secondary | ICD-10-CM | POA: Diagnosis not present

## 2022-09-13 DIAGNOSIS — M797 Fibromyalgia: Secondary | ICD-10-CM | POA: Diagnosis not present

## 2022-09-13 DIAGNOSIS — Z7901 Long term (current) use of anticoagulants: Secondary | ICD-10-CM | POA: Diagnosis not present

## 2022-09-13 DIAGNOSIS — M6281 Muscle weakness (generalized): Secondary | ICD-10-CM | POA: Diagnosis not present

## 2022-09-13 DIAGNOSIS — I700 Atherosclerosis of aorta: Secondary | ICD-10-CM | POA: Diagnosis not present

## 2022-09-13 DIAGNOSIS — J9611 Chronic respiratory failure with hypoxia: Secondary | ICD-10-CM | POA: Diagnosis not present

## 2022-09-13 DIAGNOSIS — I482 Chronic atrial fibrillation, unspecified: Secondary | ICD-10-CM

## 2022-09-13 DIAGNOSIS — R1311 Dysphagia, oral phase: Secondary | ICD-10-CM | POA: Diagnosis not present

## 2022-09-13 DIAGNOSIS — R6889 Other general symptoms and signs: Secondary | ICD-10-CM | POA: Diagnosis not present

## 2022-09-13 DIAGNOSIS — J90 Pleural effusion, not elsewhere classified: Secondary | ICD-10-CM | POA: Diagnosis not present

## 2022-09-13 DIAGNOSIS — E039 Hypothyroidism, unspecified: Secondary | ICD-10-CM | POA: Insufficient documentation

## 2022-09-13 DIAGNOSIS — M25572 Pain in left ankle and joints of left foot: Secondary | ICD-10-CM | POA: Diagnosis not present

## 2022-09-13 DIAGNOSIS — J4489 Other specified chronic obstructive pulmonary disease: Secondary | ICD-10-CM | POA: Diagnosis not present

## 2022-09-13 DIAGNOSIS — J45909 Unspecified asthma, uncomplicated: Secondary | ICD-10-CM | POA: Diagnosis not present

## 2022-09-13 DIAGNOSIS — R52 Pain, unspecified: Secondary | ICD-10-CM | POA: Diagnosis not present

## 2022-09-13 DIAGNOSIS — J984 Other disorders of lung: Secondary | ICD-10-CM | POA: Diagnosis not present

## 2022-09-13 DIAGNOSIS — E782 Mixed hyperlipidemia: Secondary | ICD-10-CM | POA: Diagnosis not present

## 2022-09-13 DIAGNOSIS — R5381 Other malaise: Secondary | ICD-10-CM | POA: Diagnosis not present

## 2022-09-13 DIAGNOSIS — I10 Essential (primary) hypertension: Secondary | ICD-10-CM | POA: Diagnosis not present

## 2022-09-13 DIAGNOSIS — E559 Vitamin D deficiency, unspecified: Secondary | ICD-10-CM | POA: Diagnosis not present

## 2022-09-13 DIAGNOSIS — R2689 Other abnormalities of gait and mobility: Secondary | ICD-10-CM | POA: Diagnosis not present

## 2022-09-13 DIAGNOSIS — I69359 Hemiplegia and hemiparesis following cerebral infarction affecting unspecified side: Secondary | ICD-10-CM | POA: Diagnosis not present

## 2022-09-13 DIAGNOSIS — K589 Irritable bowel syndrome without diarrhea: Secondary | ICD-10-CM | POA: Diagnosis not present

## 2022-09-13 DIAGNOSIS — I4891 Unspecified atrial fibrillation: Secondary | ICD-10-CM | POA: Diagnosis not present

## 2022-09-13 DIAGNOSIS — G894 Chronic pain syndrome: Secondary | ICD-10-CM | POA: Diagnosis not present

## 2022-09-13 DIAGNOSIS — G4700 Insomnia, unspecified: Secondary | ICD-10-CM | POA: Diagnosis not present

## 2022-09-13 DIAGNOSIS — Z9181 History of falling: Secondary | ICD-10-CM | POA: Diagnosis not present

## 2022-09-13 DIAGNOSIS — Z743 Need for continuous supervision: Secondary | ICD-10-CM | POA: Diagnosis not present

## 2022-09-13 DIAGNOSIS — G629 Polyneuropathy, unspecified: Secondary | ICD-10-CM | POA: Diagnosis not present

## 2022-09-13 DIAGNOSIS — I499 Cardiac arrhythmia, unspecified: Secondary | ICD-10-CM | POA: Diagnosis not present

## 2022-09-13 DIAGNOSIS — S8262XD Displaced fracture of lateral malleolus of left fibula, subsequent encounter for closed fracture with routine healing: Secondary | ICD-10-CM | POA: Diagnosis not present

## 2022-09-13 DIAGNOSIS — R0902 Hypoxemia: Secondary | ICD-10-CM | POA: Diagnosis not present

## 2022-09-13 DIAGNOSIS — J441 Chronic obstructive pulmonary disease with (acute) exacerbation: Secondary | ICD-10-CM | POA: Diagnosis not present

## 2022-09-13 DIAGNOSIS — S82832A Other fracture of upper and lower end of left fibula, initial encounter for closed fracture: Secondary | ICD-10-CM | POA: Diagnosis not present

## 2022-09-13 LAB — CBC WITH DIFFERENTIAL/PLATELET
Abs Immature Granulocytes: 0.08 10*3/uL — ABNORMAL HIGH (ref 0.00–0.07)
Basophils Absolute: 0 10*3/uL (ref 0.0–0.1)
Basophils Relative: 0 %
Eosinophils Absolute: 0.7 10*3/uL — ABNORMAL HIGH (ref 0.0–0.5)
Eosinophils Relative: 6 %
HCT: 27.8 % — ABNORMAL LOW (ref 36.0–46.0)
Hemoglobin: 8.9 g/dL — ABNORMAL LOW (ref 12.0–15.0)
Immature Granulocytes: 1 %
Lymphocytes Relative: 18 %
Lymphs Abs: 2 10*3/uL (ref 0.7–4.0)
MCH: 30.7 pg (ref 26.0–34.0)
MCHC: 32 g/dL (ref 30.0–36.0)
MCV: 95.9 fL (ref 80.0–100.0)
Monocytes Absolute: 1.1 10*3/uL — ABNORMAL HIGH (ref 0.1–1.0)
Monocytes Relative: 10 %
Neutro Abs: 7.3 10*3/uL (ref 1.7–7.7)
Neutrophils Relative %: 65 %
Platelets: 308 10*3/uL (ref 150–400)
RBC: 2.9 MIL/uL — ABNORMAL LOW (ref 3.87–5.11)
RDW: 14.6 % (ref 11.5–15.5)
WBC: 11.3 10*3/uL — ABNORMAL HIGH (ref 4.0–10.5)
nRBC: 0.6 % — ABNORMAL HIGH (ref 0.0–0.2)

## 2022-09-13 LAB — CBC
HCT: 28.6 % — ABNORMAL LOW (ref 36.0–46.0)
Hemoglobin: 9.1 g/dL — ABNORMAL LOW (ref 12.0–15.0)
MCH: 30.8 pg (ref 26.0–34.0)
MCHC: 31.8 g/dL (ref 30.0–36.0)
MCV: 96.9 fL (ref 80.0–100.0)
Platelets: 302 10*3/uL (ref 150–400)
RBC: 2.95 MIL/uL — ABNORMAL LOW (ref 3.87–5.11)
RDW: 14.6 % (ref 11.5–15.5)
WBC: 9.9 10*3/uL (ref 4.0–10.5)
nRBC: 0.5 % — ABNORMAL HIGH (ref 0.0–0.2)

## 2022-09-13 LAB — BASIC METABOLIC PANEL
Anion gap: 8 (ref 5–15)
BUN: 14 mg/dL (ref 8–23)
CO2: 33 mmol/L — ABNORMAL HIGH (ref 22–32)
Calcium: 8.7 mg/dL — ABNORMAL LOW (ref 8.9–10.3)
Chloride: 99 mmol/L (ref 98–111)
Creatinine, Ser: 0.85 mg/dL (ref 0.44–1.00)
GFR, Estimated: >60 mL/min (ref >60)
Glucose, Bld: 119 mg/dL — ABNORMAL HIGH (ref 70–99)
Potassium: 3.4 mmol/L — ABNORMAL LOW (ref 3.5–5.1)
Sodium: 140 mmol/L (ref 135–145)

## 2022-09-13 LAB — D-DIMER, QUANTITATIVE: D-Dimer, Quant: 1.89 ug{FEU}/mL — ABNORMAL HIGH (ref 0.00–0.50)

## 2022-09-13 LAB — COMPREHENSIVE METABOLIC PANEL
ALT: 25 U/L (ref 0–44)
AST: 24 U/L (ref 15–41)
Albumin: 2.4 g/dL — ABNORMAL LOW (ref 3.5–5.0)
Alkaline Phosphatase: 95 U/L (ref 38–126)
Anion gap: 12 (ref 5–15)
BUN: 16 mg/dL (ref 8–23)
CO2: 28 mmol/L (ref 22–32)
Calcium: 8.4 mg/dL — ABNORMAL LOW (ref 8.9–10.3)
Chloride: 98 mmol/L (ref 98–111)
Creatinine, Ser: 0.91 mg/dL (ref 0.44–1.00)
GFR, Estimated: >60 mL/min (ref >60)
Glucose, Bld: 177 mg/dL — ABNORMAL HIGH (ref 70–99)
Potassium: 2.9 mmol/L — ABNORMAL LOW (ref 3.5–5.1)
Sodium: 138 mmol/L (ref 135–145)
Total Bilirubin: 0.9 mg/dL (ref 0.3–1.2)
Total Protein: 6.2 g/dL — ABNORMAL LOW (ref 6.5–8.1)

## 2022-09-13 LAB — CBG MONITORING, ED: Glucose-Capillary: 165 mg/dL — ABNORMAL HIGH (ref 70–99)

## 2022-09-13 LAB — MAGNESIUM: Magnesium: 2.2 mg/dL (ref 1.7–2.4)

## 2022-09-13 MED ORDER — IOHEXOL 350 MG/ML SOLN
75.0000 mL | Freq: Once | INTRAVENOUS | Status: AC | PRN
  Administered 2022-09-13: 75 mL via INTRAVENOUS

## 2022-09-13 MED ORDER — TRAZODONE HCL 50 MG PO TABS: 50.0000 mg | ORAL_TABLET | Freq: Every day | ORAL | Status: DC

## 2022-09-13 MED ORDER — MELATONIN 5 MG PO TABS: 5.0000 mg | ORAL_TABLET | Freq: Every day | ORAL | Status: DC

## 2022-09-13 MED ORDER — AMIODARONE HCL 200 MG PO TABS: ORAL_TABLET | ORAL | Status: DC

## 2022-09-13 MED ORDER — POLYETHYLENE GLYCOL 3350 17 G PO PACK: 17.0000 g | PACK | Freq: Every day | ORAL | Status: DC | PRN

## 2022-09-13 MED ORDER — POTASSIUM CHLORIDE CRYS ER 20 MEQ PO TBCR
40.0000 meq | EXTENDED_RELEASE_TABLET | Freq: Once | ORAL | Status: AC
  Administered 2022-09-13: 40 meq via ORAL
  Filled 2022-09-13: qty 2

## 2022-09-13 MED ORDER — POTASSIUM CHLORIDE 20 MEQ PO PACK: 40.0000 meq | PACK | Freq: Once | ORAL | Status: DC

## 2022-09-13 MED ORDER — GABAPENTIN 100 MG PO CAPS: 100.0000 mg | ORAL_CAPSULE | Freq: Three times a day (TID) | ORAL | Status: DC

## 2022-09-13 MED ORDER — SODIUM CHLORIDE 0.9 % IV BOLUS
500.0000 mL | Freq: Once | INTRAVENOUS | Status: AC
  Administered 2022-09-13: 500 mL via INTRAVENOUS

## 2022-09-13 MED ORDER — LEVOTHYROXINE SODIUM 25 MCG PO TABS: 25.0000 ug | ORAL_TABLET | Freq: Every day | ORAL | Status: DC

## 2022-09-13 MED ORDER — METOPROLOL TARTRATE 25 MG PO TABS: 12.5000 mg | ORAL_TABLET | Freq: Two times a day (BID) | ORAL | Status: DC

## 2022-09-13 MED ORDER — MONTELUKAST SODIUM 10 MG PO TABS: 10.0000 mg | ORAL_TABLET | Freq: Every day | ORAL | Status: DC

## 2022-09-13 MED ORDER — OXYCODONE HCL 5 MG PO TABS: 5.0000 mg | ORAL_TABLET | Freq: Four times a day (QID) | ORAL | 0 refills | Status: AC | PRN

## 2022-09-13 MED ORDER — FUROSEMIDE 40 MG PO TABS
40.0000 mg | ORAL_TABLET | Freq: Every day | ORAL | Status: DC
Start: 2022-09-13 — End: 2023-05-05

## 2022-09-13 NOTE — Progress Notes (Signed)
Rounding Note    Patient Name: Beth [name] Date of Encounter: 09/13/2022  [facility] Cardiologist: Debbe Odea, MD   Subjective   No chest pain. Dyspnea unchanged. No documented UOP for the admission. Net + 4.5 L for the admission. Hgb pending today.   Inpatient Medications    Scheduled Meds:  amiodarone  200 mg Oral BID   apixaban  5 mg Oral BID   Chlorhexidine Gluconate Cloth  6 each Topical Q0600   FLUoxetine  20 mg Oral Daily   fluticasone furoate-vilanterol  1 puff Inhalation Daily   And   umeclidinium bromide  1 puff Inhalation Daily   furosemide  40 mg Intravenous BID   gabapentin  100 mg Oral TID   levothyroxine  25 mcg Oral Q0600   melatonin  5 mg Oral QHS   metoprolol tartrate  12.5 mg Oral BID   montelukast  10 mg Oral QHS   traZODone  50 mg Oral QHS   Continuous Infusions:  sodium chloride Stopped (09/09/22 0940)   PRN Meds: acetaminophen, albuterol, oxyCODONE, polyethylene glycol   Vital Signs    Vitals:   09/13/22 0400 09/13/22 0442 09/13/22 0500 09/13/22 0812  BP:    (!) 149/71  Pulse:    77  Resp:    17  Temp:    98.6 F (37 C)  TempSrc:      SpO2: 95% 94% 94% 90%  Weight:      Height:        Intake/Output Summary (Last 24 hours) at 09/13/2022 1105 Last data filed at 09/13/2022 0914 Gross per 24 hour  Intake 120 ml  Output 700 ml  Net -580 ml      09/11/2022    5:38 AM 09/07/2022    4:59 PM 09/05/2022   12:49 PM  Last 3 Weights  Weight (lbs) 143 lb 4.8 oz 144 lb 2.9 oz 133 lb  Weight (kg) 65 kg 65.4 kg 60.328 kg      Telemetry    Sinus in the 70s bpm - Personally Reviewed  ECG    No new tracings - Personally Reviewed  Physical Exam   GEN: No acute distress.   Neck: JVD mildly elevated. Cardiac: RRR, I/VI systolic murmur without rubs or gallops.  Respiratory: Mildly diminished to auscultation bilaterally.  Respirations are unlabored at rest on 3 L of O2 via nasal cannula. GI: Soft, nontender,  non-distended  MS: Trace edema to RLE, LLE splinted; No deformity. Neuro:  Nonfocal. Psych: Normal affect.   Labs    High Sensitivity Troponin:   Recent Labs  Lab 08/30/22 1530 08/30/22 1732 09/05/22 1502 09/05/22 1712 09/07/22 1533  TROPONINIHS 10 10 14 16 9      Chemistry Recent Labs  Lab 09/07/22 0449 09/08/22 0510 09/08/22 0514 09/09/22 0352 09/10/22 0641 09/11/22 0509 09/12/22 0606  NA 140  --    < > 144 143 141 143  K 3.2*  --    < > 3.7 4.1 3.2* 4.0  CL 105  --    < > 112* 109 105 107  CO2 29  --    < > 26 26 30 29   GLUCOSE 100*  --    < > 99 117* 112* 101*  BUN 26*  --    < > 16 13 13 11   CREATININE 1.19*  --    < > 0.79 0.75 0.77 0.80  CALCIUM 8.7*  --    < > 8.7* 9.2 8.8* 8.7*  MG 1.9 1.7  --   --   --   --   --   PROT 5.6*  --   --  5.4* 6.4*  --   --   ALBUMIN 2.3*  --   --  2.4* 2.7*  --   --   AST 93*  --   --  35 27  --   --   ALT 118*  --   --  64* 55*  --   --   ALKPHOS 168*  --   --  129* 137*  --   --   BILITOT 0.6  --   --  0.8 0.5  --   --   GFRNONAA 47*  --    < > >60 >60 >60 >60  ANIONGAP 6  --    < > 6 8 6 7    < > = values in this interval not displayed.    Lipids No results for input(s): "CHOL", "TRIG", "HDL", "LABVLDL", "LDLCALC", "CHOLHDL" in the last 168 hours.  Hematology Recent Labs  Lab 09/10/22 0641 09/11/22 0509 09/12/22 0606  WBC 10.5 9.5 9.1  RBC 3.03* 2.55* 2.69*  HGB 9.3* 8.0* 8.3*  HCT 29.6* 24.2* 26.1*  MCV 97.7 94.9 97.0  MCH 30.7 31.4 30.9  MCHC 31.4 33.1 31.8  RDW 14.6 14.6 14.7  PLT 290 268 275   Thyroid  Recent Labs  Lab 09/09/22 0352  TSH 7.029*    BNPNo results for input(s): "BNP", "PROBNP" in the last 168 hours.  DDimer No results for input(s): "DDIMER" in the last 168 hours.   Radiology    No results found.  Cardiac Studies   TTE 08/26/22 1. Left ventricular ejection fraction, by estimation, is 55 to 60%. The  left ventricle has normal function. The left ventricle has no regional  wall  motion abnormalities. There is mild left ventricular hypertrophy.  Left ventricular diastolic parameters  are indeterminate.   2. Right ventricular systolic function is normal. The right ventricular  size is normal. There is severely elevated pulmonary artery systolic  pressure.   3. Left atrial size was mild to moderately dilated.   4. Right atrial size was mildly dilated.   5. The mitral valve is normal in structure. Mild mitral valve  regurgitation. No evidence of mitral stenosis.   6. Tricuspid valve regurgitation is moderate.   7. The aortic valve is normal in structure. Aortic valve regurgitation is  not visualized. Aortic valve sclerosis/calcification is present, without  any evidence of aortic stenosis.   8. The inferior vena cava is normal in size with <50% respiratory  variability, suggesting right atrial pressure of 8 mmHg.    TTE with bubble study 03/24/22 1. Left ventricular ejection fraction, by estimation, is 60 to 65%. The  left ventricle has normal function. The left ventricle has no regional  wall motion abnormalities. Left ventricular diastolic parameters were  normal.   2. Right ventricular systolic function is normal. The right ventricular  size is normal.   3. The mitral valve is normal in structure. Mild to moderate mitral valve  regurgitation. No evidence of mitral stenosis.   4. Tricuspid valve regurgitation is moderate to severe.   5. The aortic valve is normal in structure. Aortic valve regurgitation is  mild. No aortic stenosis is present.   6. The inferior vena cava is normal in size with greater than 50%  respiratory variability, suggesting right atrial pressure of 3 mmHg.   7. Agitated saline  contrast bubble study was negative, with no evidence  of any interatrial shunt.   Patient Profile     77 y.o. female with a past medical history of paroxysmal atrial fibrillation on chronic apixaban, depression/anxiety, neuropathy, insomnia, asthma, HFpEF, CVA,  COPD, former smoker, hypertension, hyperlipidemia who is admitted on 09/09/2019 was been seen and evaluated for atrial fibrillation RVR.  Assessment & Plan    Paroxysmal atrial fibrillation: -Query if paroxysms of atrial arrhythmia are contributing to her symptoms -Currently maintaining sinus rhythm -Presented to the [facility] emergency department A-fib RVR converted with IV diltiazem -has been transitioned from heparin gtt to Eliquis, monitor Hgb, low threshold to hold OAC if anemia progresses  -May need to consider Watchman in the outpatient setting  -Continue on metoprolol, BP precludes tritration -Continue recently started (09/12/22) amiodarone 200 mg bid for 2 weeks then 200 mg daily thereafter (monitor QT and maintain potassium and magnesium at goal 4.0 and 2.0 respectively) -Continue with telemetry monitoring  HFpEF with pulmonary hypertension: -Moderate to severe pulmonary hypertension on echo this admission last month -Patient transitioned back to IV Lasix 40 mg bid on 8/14 -Labs pending, will need to trend to weigh in on diuretic dosing -I/O inaccurate (no documented UOP) -Not a good candidate for SGLT2 inhibitor with recurrent UTIs -Consider addition of MRA in follow up  Atypical chest pain: -Nonspecific without EKG changes -Echo with preserved LVSF and no wall motion abnormalities -Troponin negative x 3 -Patient is currently not a good candidate for invasive procedures like cardiac catheterization due to her comorbidities, including anemia, and recurrent falls -Continued on metoprolol for antianginal therapy as blood pressure allows -Can consider pharmacologic MPI if chest pain persist once patient has had adequate diuresis -Blood Transfusion may help with the shortness of breath and chest discomfort as well -Can consider long-acting nitrates if chest pain continues  Anemia -Hemoglobin low, though stable at 8.3 on 8/14, on arrival she was 10.8, prior to starting apixaban she was  11.2 -CBC pending -No active bleeding noted -FOBT still to be obtained -Daily CBC -Recommend transfusing to keep hemoglobin greater than 8 if needed, continued chest discomfort patient may benefit from blood transfusion  Hypokalemia -Repleted  COPD with chronic respiratory failure with hypoxia -Maintaining oxygen saturations on 3 L of O2 via nasal cannula -Bronchodilators -Supportive care  UTI -Continued management per primary team  Left ankle fracture -Management per orthopedics -Ankle remains splinted  Chronic pain -Per primary team     For questions or updates, please contact [facility] Please consult [email or web address] for contact info under        Signed, Eula Listen, PA-C  09/13/2022, 11:05 AM

## 2022-09-13 NOTE — Discharge Summary (Signed)
Physician Discharge Summary  Patient: EPHRATA MCADAM WUX:[id number] DOB: 08-13-1945   Code Status: Full Code Admit date: 09/05/2022 Discharge date: 09/13/2022 Disposition: Skilled nursing facility, PT, OT, nurse aid, RN, and wound care PCP: Smitty Cords, DO  Recommendations for Outpatient Follow-up:  Follow up with PCP within 1-2 weeks Regarding general hospital follow up and preventative care Recommend rechecking TSH as she was started on levothyroxine- new diagnosis.  CBC to monitor anemia. Hgb 8.3 on day prior to dc. Continued on home eliquis with no acute bleeding observed Follow up with ortho in ~1 week for ankle fracture management Follow up with cardiology Regarding atrial fibrillation treatment  Discharge Diagnoses:  Principal Problem:   Inadequate pain control Active Problems:   HLD (hyperlipidemia)   HTN (hypertension)   Anxiety and depression   CVA, old, hemiparesis (HCC)   Fibromyalgia syndrome   Benign hypertension   Chronic recurrent major depressive disorder (HCC)   Major depressive disorder, recurrent, severe w/o psychotic behavior (HCC)   Chronic pain syndrome   Atherosclerosis of aorta (HCC)   Pulmonary hypertension, unspecified (HCC)   Asthma with COPD   Closed fracture of left lateral malleolus   Atrial fibrillation, chronic (HCC)   Closed fracture of left ankle   Elevated liver enzymes   Chronic respiratory failure with hypoxia (HCC)   Pressure injury of skin   Atrial fibrillation with rapid ventricular response (HCC)   Other emphysema (HCC)   Other specified hypothyroidism  Brief Hospital Course Summary: Ms. Zeyneb Zimbelman is a 77 year old female with history of CVA with left-sided residual weakness, depression, anxiety, neuropathy, insomnia, atrial fibrillation on Eliquis, and chronic respiratory failure with hypoxia requiring around 3L O2 at baseline.   They presented to the emergency department for chief concerns of a fall in the  morning 09/05/22 - Reports that she tripped over her oxygen cord and fell. She denies loss of consciousness. She denies head trauma. On 3L O2 at baseline. Typically wears at bedtime only.  08/08 In the ED, was found to have left lateral malleolus acute fracture that is minimally displaced. Admitted for ortho consult and pain control. Per ortho (Dr Joice Lofts) plan for nonsurgical treatment w/ splint.  PT/OT evaluated and recommend SNF at discharge to assist with recovery and patient is agreeable.   While inpatient, her stay was complicated by hypotension contributed by opioid medications for pain relief as well as atrial fibrillation RVR.  Cardiology was consulted to assist in medication modifications. She was briefly on vasopressors and quickly weaned off to midodrine and that too was eventually weaned off. She remained hemodynamically stable throughout the rest of her stay.   Her afib was controlled with diltiazem gtt and was on heparin gtt for anticipated interventions if needing to hold eliquis. She converted to sinus rhythm and cardiology transitioned her to amiodarone which will be tapered after discharge per the following discharge medication list.  Echo EF 70-75%, diastolic fxn uncertain, severely elevated pulm artery pressure   Additionally, she was treated for UTI throughout her stay and was asymptomatic at time of dc.   She was found to have elevated TSH which may be contributing to the difficult to manage Afib and was started on levothyroxine.   She is currently not experiencing any concerning respiratory symptoms but appears to have an all time requirement of using her oxygen which was not used consistently prior to admission. This could potentially be weaned down again in the future.   Discharge Condition: Stable, improved  Recommended discharge diet: Cardiac diet  Consultations: Cardiology  PCCM Ortho surgery   Procedures/Studies: Vasopressors and diltiazem gtt L ankle  splint  Allergies as of 09/13/2022       Reactions   Bextra  [valdecoxib]    Compazine [prochlorperazine Edisylate]    Stroke-like symptoms   Lithium Carbonate    Leg weakness   Lyrica [pregabalin]         Medication List     STOP taking these medications    aspirin 81 MG chewable tablet   Melatonin 3 MG Caps Replaced by: melatonin 5 MG Tabs   methocarbamol 500 MG tablet Commonly known as: ROBAXIN   metoprolol succinate 25 MG 24 hr tablet Commonly known as: Toprol XL   nitrofurantoin (macrocrystal-monohydrate) 100 MG capsule Commonly known as: MACROBID   ondansetron 4 MG disintegrating tablet Commonly known as: ZOFRAN-ODT   potassium chloride 10 MEQ tablet Commonly known as: KLOR-CON   QUEtiapine 50 MG tablet Commonly known as: SEROQUEL   traMADol 50 MG tablet Commonly known as: ULTRAM       TAKE these medications    acetaminophen 500 MG tablet Commonly known as: TYLENOL Take 500 mg by mouth 2 (two) times daily as needed.   albuterol 108 (90 Base) MCG/ACT inhaler Commonly known as: VENTOLIN HFA Inhale 1-2 puffs into the lungs every 6 (six) hours as needed for wheezing or shortness of breath.   amiodarone 200 MG tablet Commonly known as: PACERONE Take 1 tablet (200 mg total) by mouth 2 (two) times daily for 12 days, THEN 1 tablet (200 mg total) daily. Start taking on: September 13, 2022   apixaban 5 MG Tabs tablet Commonly known as: ELIQUIS Take 1 tablet (5 mg total) by mouth 2 (two) times daily.   atorvastatin 40 MG tablet Commonly known as: LIPITOR TAKE 1 TABLET BY MOUTH EVERY DAY What changed:  when to take this additional instructions   clobetasol ointment 0.05 % Commonly known as: TEMOVATE Apply 1 Application topically daily.   FLUoxetine 20 MG capsule Commonly known as: PROZAC Take 1 capsule (20 mg total) by mouth daily.   furosemide 40 MG tablet Commonly known as: LASIX Take 1 tablet (40 mg total) by mouth daily. If swelling is  less, can take only 1 pill. What changed: medication strength   gabapentin 100 MG capsule Commonly known as: NEURONTIN Take 1 capsule (100 mg total) by mouth 3 (three) times daily. What changed:  medication strength how much to take additional instructions   levothyroxine 25 MCG tablet Commonly known as: SYNTHROID Take 1 tablet (25 mcg total) by mouth daily at 6 (six) AM. Start taking on: September 14, 2022   melatonin 5 MG Tabs Take 1 tablet (5 mg total) by mouth at bedtime. Replaces: Melatonin 3 MG Caps   metoprolol tartrate 25 MG tablet Commonly known as: LOPRESSOR Take 0.5 tablets (12.5 mg total) by mouth 2 (two) times daily.   montelukast 10 MG tablet Commonly known as: SINGULAIR TAKE 1 TABLET(10 MG) BY MOUTH AT BEDTIME   montelukast 10 MG tablet Commonly known as: SINGULAIR Take 1 tablet (10 mg total) by mouth at bedtime.   oxyCODONE 5 MG immediate release tablet Commonly known as: Oxy IR/ROXICODONE Take 1 tablet (5 mg total) by mouth every 6 (six) hours as needed for up to 5 days for severe pain or breakthrough pain.   polyethylene glycol 17 g packet Commonly known as: MIRALAX / GLYCOLAX Take 17 g by mouth daily as needed for mild  constipation.   traZODone 50 MG tablet Commonly known as: DESYREL Take 1 tablet (50 mg total) by mouth at bedtime.   Trelegy Ellipta 200-62.5-25 MCG/ACT Aepb Generic drug: Fluticasone-Umeclidin-Vilant INHALE 1 PUFF INTO THE LUNGS DAILY       Subjective   Pt reports feeling well today. Has minimal pain in ankle and not at rest today. Denies any respiratory distress. She is very much looking forward to dc and ability to get home.   All questions and concerns were addressed at time of discharge.  Objective  Blood pressure (!) 149/71, pulse 77, temperature 98.6 F (37 C), resp. rate 17, height 5\' 5"  (1.651 m), weight 65 kg, SpO2 90%.   General: Pt is alert, awake, not in acute distress Cardiovascular: RRR, S1/S2 +, no rubs, no  gallops Respiratory: CTA bilaterally, no wheezing, no rhonchi Abdominal: Soft, NT, ND, bowel sounds + Extremities: no edema, no cyanosis. L ankle in splint. Distal circulation and ROM intact.   The results of significant diagnostics from this hospitalization (including imaging, microbiology, ancillary and laboratory) are listed below for reference.   Imaging studies: ECHOCARDIOGRAM COMPLETE  Result Date: 09/09/2022    ECHOCARDIOGRAM REPORT   Patient Name:   CYNIAH BILINSKI Date of Exam: 09/09/2022 Medical Rec #:  [id number]        Height:       65.0 in Accession #:    [id number]       Weight:       144.2 lb Date of Birth:  [birth year]        BSA:          1.721 m Patient Age:    76 years         BP:           126/64 mmHg Patient Gender: F                HR:           64 bpm. Exam Location:  [facility] Procedure: 2D Echo, Cardiac Doppler and Color Doppler Indications:     Atrial Fibrillation I48.91  History:         Patient has prior history of Echocardiogram examinations, most                  recent 08/26/2022. COPD; Risk Factors:Hypertension and                  Dyslipidemia.  Sonographer:     Cristela Blue Referring Phys:  [id number] Sunnie Nielsen Diagnosing Phys: Yvonne Kendall MD IMPRESSIONS  1. Left ventricular ejection fraction, by estimation, is 70 to 75%. The left ventricle has hyperdynamic function. Left ventricular endocardial border not optimally defined to evaluate regional wall motion. There is mild left ventricular hypertrophy. Left ventricular diastolic function could not be evaluated.  2. Pulmonary artery pressure is moderately to severely elevated (RVSP 50-55 mmHg plus central venous/right atrial pressure).. Right ventricular systolic function is normal. The right ventricular size is normal.  3. The mitral valve is degenerative. Mild mitral valve regurgitation. No evidence of mitral stenosis. Moderate mitral annular calcification.  4. Tricuspid valve regurgitation is moderate to severe.  5. The  aortic valve was not well visualized. Aortic valve regurgitation is not visualized. No aortic stenosis is present.  6. Pulmonic valve regurgitation not well-assessed. FINDINGS  Left Ventricle: Left ventricular ejection fraction, by estimation, is 70 to 75%. The left ventricle has hyperdynamic function. Left ventricular endocardial border not optimally defined to evaluate regional  wall motion. The left ventricular internal cavity size was normal in size. There is mild left ventricular hypertrophy. Left ventricular diastolic function could not be evaluated due to atrial fibrillation. Left ventricular diastolic function could not be evaluated. Right Ventricle: Pulmonary artery pressure is moderately to severely elevated (RVSP 50-55 mmHg plus central venous/right atrial pressure). The right ventricular size is normal. Right vetricular wall thickness was not well visualized. Right ventricular systolic function is normal. Left Atrium: Left atrial size was normal in size. Right Atrium: Right atrial size was normal in size. Pericardium: Trivial pericardial effusion is present. Mitral Valve: The mitral valve is degenerative in appearance. There is mild thickening of the mitral valve leaflet(s). Moderate mitral annular calcification. Mild mitral valve regurgitation. No evidence of mitral valve stenosis. Tricuspid Valve: The tricuspid valve is not well visualized. Tricuspid valve regurgitation is moderate to severe. Aortic Valve: The aortic valve was not well visualized. Aortic valve regurgitation is not visualized. No aortic stenosis is present. Aortic valve mean gradient measures 5.0 mmHg. Aortic valve peak gradient measures 7.2 mmHg. Aortic valve area, by VTI measures 2.64 cm. Pulmonic Valve: The pulmonic valve was not well visualized. Pulmonic valve regurgitation not well-assessed. Aorta: The aortic root is normal in size and structure. Pulmonary Artery: The pulmonary artery is not well seen. Venous: The inferior vena  cava was not well visualized. IAS/Shunts: The interatrial septum was not well visualized.  LEFT VENTRICLE PLAX 2D LVIDd:         3.80 cm LVIDs:         2.30 cm LV PW:         1.10 cm LV IVS:        1.30 cm LVOT diam:     2.00 cm LV SV:         59 LV SV Index:   34 LVOT Area:     3.14 cm  RIGHT VENTRICLE RV Basal diam:  2.90 cm RV Mid diam:    2.60 cm LEFT ATRIUM             Index        RIGHT ATRIUM           Index LA diam:        4.30 cm 2.50 cm/m   RA Area:     12.80 cm LA Vol (A2C):   54.7 ml 31.78 ml/m  RA Volume:   28.70 ml  16.67 ml/m LA Vol (A4C):   36.7 ml 21.32 ml/m LA Biplane Vol: 48.6 ml 28.23 ml/m  AORTIC VALVE AV Area (Vmax):    2.28 cm AV Area (Vmean):   2.11 cm AV Area (VTI):     2.64 cm AV Vmax:           134.00 cm/s AV Vmean:          102.200 cm/s AV VTI:            0.222 m AV Peak Grad:      7.2 mmHg AV Mean Grad:      5.0 mmHg LVOT Vmax:         97.40 cm/s LVOT Vmean:        68.700 cm/s LVOT VTI:          0.187 m LVOT/AV VTI ratio: 0.84  AORTA Ao Root diam: 2.80 cm MITRAL VALVE                TRICUSPID VALVE MV Area (PHT): 5.20 cm     TR Peak  grad:   53.6 mmHg MV Decel Time: 146 msec     TR Vmax:        366.00 cm/s MV E velocity: 136.00 cm/s                             SHUNTS                             Systemic VTI:  0.19 m                             Systemic Diam: 2.00 cm Yvonne Kendall MD Electronically signed by Yvonne Kendall MD Signature Date/Time: 09/09/2022/4:25:14 PM    Final    CT Lumbar Spine Wo Contrast  Result Date: 09/05/2022 CLINICAL DATA:  Back trauma.  Fall. EXAM: CT LUMBAR SPINE WITHOUT CONTRAST TECHNIQUE: Multidetector CT imaging of the lumbar spine was performed without intravenous contrast administration. Multiplanar CT image reconstructions were also generated. RADIATION DOSE REDUCTION: This exam was performed according to the departmental dose-optimization program which includes automated exposure control, adjustment of the mA and/or kV according to patient  size and/or use of iterative reconstruction technique. COMPARISON:  Lumbar spine radiographs 08/12/2022. FINDINGS: Segmentation: Conventional numbering is assumed with 5 non-rib-bearing, lumbar type vertebral bodies. Alignment: Normal. Vertebrae: Unchanged chronic compression deformity of the T12 vertebral body with moderate anterior height loss. No new fracture. Multilevel degenerative endplate changes. Paraspinal and other soft tissues: Atherosclerotic calcifications of the abdominal aorta and its branches. Disc levels: Multilevel lumbar spondylosis, worst at L4-5, where there is at least moderate spinal canal stenosis. IMPRESSION: 1. No acute fracture or traumatic malalignment of the lumbar spine. 2. Unchanged chronic compression deformity of the T12 vertebral body with moderate anterior height loss. 3. Multilevel lumbar spondylosis, worst at L4-5, where there is at least moderate spinal canal stenosis. Aortic Atherosclerosis (ICD10-I70.0). Electronically Signed   By: Orvan Falconer M.D.   On: 09/05/2022 14:48   CT HEAD WO CONTRAST ( )  Result Date: 09/05/2022 CLINICAL DATA:  Head trauma, minor (Age >= 65y); Neck trauma (Age >= 65y). Fall. EXAM: CT HEAD WITHOUT CONTRAST CT CERVICAL SPINE WITHOUT CONTRAST TECHNIQUE: Multidetector CT imaging of the head and cervical spine was performed following the standard protocol without intravenous contrast. Multiplanar CT image reconstructions of the cervical spine were also generated. RADIATION DOSE REDUCTION: This exam was performed according to the departmental dose-optimization program which includes automated exposure control, adjustment of the mA and/or kV according to patient size and/or use of iterative reconstruction technique. COMPARISON:  Head CT and brain MRI 03/24/2022. MRI cervical spine 03/24/2022. FINDINGS: CT HEAD FINDINGS Brain: Within limits of motion artifact, no acute hemorrhage. Unchanged mild chronic small-vessel disease with old lacunar infarct  in the right lentiform nucleus. No hydrocephalus or extra-axial collection. No mass effect or midline shift. Vascular: No hyperdense vessel or unexpected calcification. Skull: No calvarial fracture or suspicious bone lesion. Skull base is unremarkable. Sinuses/Orbits: No acute finding. Other: None. CT CERVICAL SPINE FINDINGS Alignment: Normal. Skull base and vertebrae: Prior C5-C7 ACDF with solid bony fusion across the intervening disc spaces. Hardware is intact. No associated lucency. No acute fracture. Craniocervical junction is intact. Soft tissues and spinal canal: No prevertebral fluid or swelling. No visible canal hematoma. Disc levels: Mild cervical spondylosis without high-grade spinal canal stenosis. Upper chest: Emphysema in the lung apices. Other: None.  IMPRESSION: 1. No acute intracranial abnormality. 2. No acute cervical spine fracture or traumatic listhesis. 3. Prior C5-C7 ACDF with solid bony fusion across the intervening disc spaces. Electronically Signed   By: Orvan Falconer M.D.   On: 09/05/2022 14:32   CT Cervical Spine Wo Contrast  Result Date: 09/05/2022 CLINICAL DATA:  Head trauma, minor (Age >= 65y); Neck trauma (Age >= 65y). Fall. EXAM: CT HEAD WITHOUT CONTRAST CT CERVICAL SPINE WITHOUT CONTRAST TECHNIQUE: Multidetector CT imaging of the head and cervical spine was performed following the standard protocol without intravenous contrast. Multiplanar CT image reconstructions of the cervical spine were also generated. RADIATION DOSE REDUCTION: This exam was performed according to the departmental dose-optimization program which includes automated exposure control, adjustment of the mA and/or kV according to patient size and/or use of iterative reconstruction technique. COMPARISON:  Head CT and brain MRI 03/24/2022. MRI cervical spine 03/24/2022. FINDINGS: CT HEAD FINDINGS Brain: Within limits of motion artifact, no acute hemorrhage. Unchanged mild chronic small-vessel disease with old lacunar  infarct in the right lentiform nucleus. No hydrocephalus or extra-axial collection. No mass effect or midline shift. Vascular: No hyperdense vessel or unexpected calcification. Skull: No calvarial fracture or suspicious bone lesion. Skull base is unremarkable. Sinuses/Orbits: No acute finding. Other: None. CT CERVICAL SPINE FINDINGS Alignment: Normal. Skull base and vertebrae: Prior C5-C7 ACDF with solid bony fusion across the intervening disc spaces. Hardware is intact. No associated lucency. No acute fracture. Craniocervical junction is intact. Soft tissues and spinal canal: No prevertebral fluid or swelling. No visible canal hematoma. Disc levels: Mild cervical spondylosis without high-grade spinal canal stenosis. Upper chest: Emphysema in the lung apices. Other: None. IMPRESSION: 1. No acute intracranial abnormality. 2. No acute cervical spine fracture or traumatic listhesis. 3. Prior C5-C7 ACDF with solid bony fusion across the intervening disc spaces. Electronically Signed   By: Orvan Falconer M.D.   On: 09/05/2022 14:32   DG Tibia/Fibula Left  Result Date: 09/05/2022 CLINICAL DATA:  pain injury EXAM: LEFT TIBIA AND FIBULA - 2 VIEW COMPARISON:  Ankle radiograph from earlier the same day. FINDINGS: There is acute minimally displaced oblique fracture of the later malleolus. Acute fracture of the medial malleolus was better seen on the prior ankle radiograph. No other acute fracture or dislocation. No aggressive osseous lesion. The knee joint appears within normal limits. No significant arthritis. Ankle mortise appears intact. No focal soft tissue swelling. No radiopaque foreign bodies. IMPRESSION: Acute minimally displaced fracture of the lateral malleolus. Electronically Signed   By: Jules Schick M.D.   On: 09/05/2022 14:18   DG Hip Unilat W or Wo Pelvis 2-3 Views Left  Result Date: 09/05/2022 CLINICAL DATA:  pain injury EXAM: DG HIP (WITH OR WITHOUT PELVIS) 2-3V LEFT COMPARISON:  None Available.  FINDINGS: Pelvis is intact with normal and symmetric sacroiliac joints. No acute fracture or dislocation. No aggressive osseous lesion. Visualized sacral arcuate lines are unremarkable. Unremarkable symphysis pubis. There are mild degenerative changes of bilateral hip joints without significant joint space narrowing. Osteophytosis of the superior acetabulum. No radiopaque foreign bodies. IMPRESSION: *No acute fracture or dislocation. Mild degenerative changes of the hip joints. Electronically Signed   By: Jules Schick M.D.   On: 09/05/2022 14:14   DG Ankle Complete Left  Result Date: 09/05/2022 CLINICAL DATA:  fall EXAM: LEFT ANKLE COMPLETE - 3+ VIEW COMPARISON:  None Available. FINDINGS: Acute minimally displaced transsyndesmotic distal fibular fracture. Acute displaced medial malleolar avulsion fracture. Slight cortical irregularity of the posterior malleolus  suggestive of a nondisplaced fracture. There is no evidence of severe arthropathy or other focal bone abnormality. Lateral greater than medial subcutaneus soft tissue edema. IMPRESSION: 1. Acute minimally displaced transsyndesmotic distal fibular fracture. 2. Acute displaced medial malleolar avulsion fracture. 3. Slight cortical irregularity of the posterior malleolus suggestive of a nondisplaced fracture. Electronically Signed   By: Tish Frederickson M.D.   On: 09/05/2022 13:48   ECHOCARDIOGRAM COMPLETE  Result Date: 08/26/2022    ECHOCARDIOGRAM REPORT   Patient Name:   VINDA WAYLAND Date of Exam: 08/26/2022 Medical Rec #:  [id number]        Height:       65.0 in Accession #:    [id number]       Weight:       146.2 lb Date of Birth:  08/03/1945        BSA:          1.731 m Patient Age:    21 years         BP:           132/57 mmHg Patient Gender: F                HR:           90 bpm. Exam Location:  [facility] Procedure: 2D Echo, Cardiac Doppler and Color Doppler Indications:     CHF-Acute diastolic I50.31  History:         Patient has prior history of  Echocardiogram examinations, most                  recent 03/24/2022. COPD and Stroke; Risk Factors:Hypertension.  Sonographer:     Cristela Blue Referring Phys:  [id number] Brien Few NIU Diagnosing Phys: Lorine Bears MD IMPRESSIONS  1. Left ventricular ejection fraction, by estimation, is 55 to 60%. The left ventricle has normal function. The left ventricle has no regional wall motion abnormalities. There is mild left ventricular hypertrophy. Left ventricular diastolic parameters are indeterminate.  2. Right ventricular systolic function is normal. The right ventricular size is normal. There is severely elevated pulmonary artery systolic pressure.  3. Left atrial size was mild to moderately dilated.  4. Right atrial size was mildly dilated.  5. The mitral valve is normal in structure. Mild mitral valve regurgitation. No evidence of mitral stenosis.  6. Tricuspid valve regurgitation is moderate.  7. The aortic valve is normal in structure. Aortic valve regurgitation is not visualized. Aortic valve sclerosis/calcification is present, without any evidence of aortic stenosis.  8. The inferior vena cava is normal in size with <50% respiratory variability, suggesting right atrial pressure of 8 mmHg. FINDINGS  Left Ventricle: Left ventricular ejection fraction, by estimation, is 55 to 60%. The left ventricle has normal function. The left ventricle has no regional wall motion abnormalities. The left ventricular internal cavity size was normal in size. There is  mild left ventricular hypertrophy. Left ventricular diastolic parameters are indeterminate. Right Ventricle: The right ventricular size is normal. No increase in right ventricular wall thickness. Right ventricular systolic function is normal. There is severely elevated pulmonary artery systolic pressure. The tricuspid regurgitant velocity is 3.63 m/s, and with an assumed right atrial pressure of 8 mmHg, the estimated right ventricular systolic pressure is 60.7 mmHg. Left  Atrium: Left atrial size was mild to moderately dilated. Right Atrium: Right atrial size was mildly dilated. Pericardium: There is no evidence of pericardial effusion. Mitral Valve: The mitral valve is normal in structure. Mild mitral annular calcification.  Mild mitral valve regurgitation. No evidence of mitral valve stenosis. MV peak gradient, 7.5 mmHg. The mean mitral valve gradient is 4.0 mmHg. Tricuspid Valve: The tricuspid valve is normal in structure. Tricuspid valve regurgitation is moderate . No evidence of tricuspid stenosis. Aortic Valve: The aortic valve is normal in structure. Aortic valve regurgitation is not visualized. Aortic valve sclerosis/calcification is present, without any evidence of aortic stenosis. Aortic valve mean gradient measures 7.0 mmHg. Aortic valve peak  gradient measures 12.8 mmHg. Aortic valve area, by VTI measures 2.44 cm. Pulmonic Valve: The pulmonic valve was normal in structure. Pulmonic valve regurgitation is not visualized. No evidence of pulmonic stenosis. Aorta: The aortic root is normal in size and structure. Venous: The inferior vena cava is normal in size with less than 50% respiratory variability, suggesting right atrial pressure of 8 mmHg. IAS/Shunts: No atrial level shunt detected by color flow Doppler.  LEFT VENTRICLE PLAX 2D LVIDd:         3.90 cm   Diastology LVIDs:         2.80 cm   LV e' medial:    7.07 cm/s LV PW:         1.00 cm   LV E/e' medial:  17.7 LV IVS:        1.10 cm   LV e' lateral:   13.90 cm/s LVOT diam:     2.00 cm   LV E/e' lateral: 9.0 LV SV:         81 LV SV Index:   47 LVOT Area:     3.14 cm  RIGHT VENTRICLE RV Basal diam:  3.60 cm RV Mid diam:    2.70 cm LEFT ATRIUM             Index        RIGHT ATRIUM           Index LA diam:        4.80 cm 2.77 cm/m   RA Area:     20.20 cm LA Vol (A2C):   66.7 ml 38.53 ml/m  RA Volume:   57.20 ml  33.04 ml/m LA Vol (A4C):   58.8 ml 33.96 ml/m LA Biplane Vol: 66.2 ml 38.24 ml/m  AORTIC VALVE AV Area  (Vmax):    2.26 cm AV Area (Vmean):   2.25 cm AV Area (VTI):     2.44 cm AV Vmax:           179.00 cm/s AV Vmean:          124.500 cm/s AV VTI:            0.332 m AV Peak Grad:      12.8 mmHg AV Mean Grad:      7.0 mmHg LVOT Vmax:         129.00 cm/s LVOT Vmean:        89.300 cm/s LVOT VTI:          0.257 m LVOT/AV VTI ratio: 0.78  AORTA Ao Root diam: 3.03 cm MITRAL VALVE                TRICUSPID VALVE MV Area (PHT): 4.24 cm     TR Peak grad:   52.7 mmHg MV Area VTI:   2.99 cm     TR Vmax:        363.00 cm/s MV Peak grad:  7.5 mmHg MV Mean grad:  4.0 mmHg     SHUNTS MV Vmax:  1.37 m/s     Systemic VTI:  0.26 m MV Vmean:      92.6 cm/s    Systemic Diam: 2.00 cm MV Decel Time: 179 msec MV E velocity: 125.00 cm/s MV A velocity: 95.40 cm/s MV E/A ratio:  1.31 Lorine Bears MD Electronically signed by Lorine Bears MD Signature Date/Time: 08/26/2022/11:32:20 AM    Final    DG Chest Portable 1 View  Result Date: 08/25/2022 CLINICAL DATA:  Shortness of breath EXAM: PORTABLE CHEST 1 VIEW COMPARISON:  X-ray 03/24/2022 FINDINGS: Normal cardiopericardial silhouette with a calcified aorta. Diffuse interstitial changes. No pneumothorax, effusion or consolidation. Overlapping cardiac leads. Fixation hardware along the lower cervical spine. Calcified aorta. Hyperinflation. IMPRESSION: Hyperinflation. Interstitial changes. Increased from previous. Acute process is possible. Recommend follow-up Electronically Signed   By: Karen Kays M.D.   On: 08/25/2022 13:56    Labs: Basic Metabolic Panel: Recent Labs  Lab 09/07/22 0449 09/08/22 0510 09/08/22 0514 09/09/22 0352 09/10/22 0641 09/11/22 0509 09/12/22 0606  NA 140  --  140 144 143 141 143  K 3.2*  --  3.2* 3.7 4.1 3.2* 4.0  CL 105  --  109 112* 109 105 107  CO2 29  --  24 26 26 30 29   GLUCOSE 100*  --  92 99 117* 112* 101*  BUN 26*  --  20 16 13 13 11   CREATININE 1.19*  --  0.74 0.79 0.75 0.77 0.80  CALCIUM 8.7*  --  8.5* 8.7* 9.2 8.8* 8.7*  MG  1.9 1.7  --   --   --   --   --    CBC: Recent Labs  Lab 09/07/22 0449 09/07/22 1349 09/09/22 0352 09/10/22 0641 09/11/22 0509 09/12/22 0606  WBC 8.9  --  8.9 10.5 9.5 9.1  NEUTROABS 4.8  --   --   --   --   --   HGB 8.8* 9.3* 8.5* 9.3* 8.0* 8.3*  HCT 26.9* 28.6* 26.1* 29.6* 24.2* 26.1*  MCV 96.1  --  96.7 97.7 94.9 97.0  PLT 289  --  304 290 268 275   Microbiology: Results for orders placed or performed during the hospital encounter of 09/05/22  Urine Culture     Status: Abnormal   Collection Time: 09/06/22  7:24 PM   Specimen: Urine, Random  Result Value Ref Range Status   Specimen Description   Final    URINE, RANDOM Performed at [facility], [address] [place], [place] [address]    Special Requests   Final    NONE Reflexed from [id number] Performed at [facility], [address] [place], [place] [address]    Culture MULTIPLE SPECIES PRESENT, SUGGEST RECOLLECTION (A)  Final   Report Status 09/09/2022 FINAL  Final  MRSA Next Gen by PCR, Nasal     Status: None   Collection Time: 09/07/22  5:01 PM   Specimen: Nasal Mucosa; Nasal Swab  Result Value Ref Range Status   MRSA by PCR Next Gen NOT DETECTED NOT DETECTED Final    Comment: (NOTE) The GeneXpert MRSA Assay (FDA approved for NASAL specimens only), is one component of a comprehensive MRSA colonization surveillance program. It is not intended to diagnose MRSA infection nor to guide or monitor treatment for MRSA infections. Test performance is not FDA approved in patients less than 60 years old. Performed at [facility], [address] [place], [place] [address]    Time coordinating discharge: Over 30 minutes  Leeroy Bock, MD  [facility] 09/13/2022, 11:03 AM

## 2022-09-13 NOTE — Patient Outreach (Signed)
  Care Coordination   Follow Up Visit Note   09/13/2022 Name: Beth [name] MRN: [id number] DOB: Jun 19, 1945  Beth [name] is a 77 y.o. year old female who sees Smitty Cords, DO for primary care. I spoke with  Leland Johns by phone today.  What matters to the patients health and wellness today?  Admission to short term rehab.    Goals Addressed             This Visit's Progress    care coordination activities       Interventions Today    Flowsheet Row Most Recent Value  Chronic Disease   Chronic disease during today's visit Other  [chronic pain]  General Interventions   General Interventions Discussed/Reviewed General Interventions Discussed, General Interventions Reviewed, Level of Care  Level of Care Skilled Nursing Facility  [patient confirmed that she has now be transitioned to [facility] for short-term rehab CSW to follow up with patient post discharge from rehab to assess for ongoing care needs]              SDOH assessments and interventions completed:  No     Care Coordination Interventions:  Yes, provided   Follow up plan:  to be scheduled post discharge from short term rehab    Encounter Outcome:  Pt. Visit Completed

## 2022-09-13 NOTE — Progress Notes (Signed)
Patient Aox4, BP 100-130/40-60's. O2 3L -5L at rest. > 92%. Patient desat's to 50-60% on room air. Encouraging Incentive spirometer use. Minimal c/o pain from fx ankle. Pt able to stand and pivot to [facility]. See flowsheet for detailed assessment. Call bell within reach, bed alarm on. Care ongoing.

## 2022-09-13 NOTE — TOC Progression Note (Signed)
Transition of Care [facility]) - Progression Note    Patient Details  Name: FRANCENA ORECCHIO MRN: [id number] Date of Birth: [birth year]  Transition of Care [facility]) CM/SW Contact  Darolyn Rua, [place] Phone Number: 09/13/2022, 9:44 AM  Clinical Narrative:     CSW spoke with Clide Cliff at [facility] who reports supporting documents from law firm were accepted and patient can discharge to facility today, MD updated.   Expected Discharge Plan: Skilled Nursing Facility Barriers to Discharge: Continued Medical Work up  Expected Discharge Plan and Services   Discharge Planning Services: CM Consult Post Acute Care Choice: Home Health, Skilled Nursing Facility Living arrangements for the past 2 months: Apartment                   DME Agency: NA                   Social Determinants of Health (SDOH) Interventions SDOH Screenings   Food Insecurity: No Food Insecurity (09/12/2022)  Housing: High Risk (09/12/2022)  Transportation Needs: No Transportation Needs (09/12/2022)  Utilities: Not At Risk (09/05/2022)  Alcohol Screen: Low Risk  (02/01/2022)  Depression (PHQ2-9): High Risk (05/13/2022)  Financial Resource Strain: Low Risk  (02/01/2022)  Physical Activity: Insufficiently Active (02/01/2022)  Social Connections: Socially Isolated (02/01/2022)  Stress: No Stress Concern Present (02/01/2022)  Tobacco Use: High Risk (09/05/2022)    Readmission Risk Interventions    09/09/2022   12:06 PM  Readmission Risk Prevention Plan  Transportation Screening Complete  Medication Review (RN Care Manager) Referral to Pharmacy  PCP or Specialist appointment within 3-5 days of discharge Complete  HRI or Home Care Consult Complete  SW Recovery Care/Counseling Consult Complete  Palliative Care Screening Complete  Skilled Nursing Facility Complete

## 2022-09-13 NOTE — TOC Transition Note (Signed)
Transition of Care [facility]) - CM/SW Discharge Note   Patient Details  Name: Beth [name] MRN: [id number] Date of Birth: Apr 22, 1945  Transition of Care [name]) CM/SW Contact:  Darolyn Rua, LCSW Phone Number: 09/13/2022, 10:50 AM   Clinical Narrative:     Patient will DC to: [facility] Anticipated DC date:09/13/22 Transport [id number]  Per MD patient ready for DC to [facility]. RN, patient, patient's family, and facility notified of DC. Discharge Summary sent to facility. RN given number for report  [phone] Room E3. DC packet on chart. Ambulance transport requested for patient.  CSW signing off.  Angeline Slim, LCSW    Final next level of care: Skilled Nursing Facility Barriers to Discharge: No Barriers Identified   Patient Goals and CMS Choice CMS Medicare.gov Compare Post Acute Care list provided to:: Patient Choice offered to / list presented to : Patient  Discharge Placement                         Discharge Plan and Services Additional resources added to the After Visit Summary for     Discharge Planning Services: CM Consult Post Acute Care Choice: Home Health, Skilled Nursing Facility            DME Agency: NA                  Social Determinants of Health (SDOH) Interventions SDOH Screenings   Food Insecurity: No Food Insecurity (09/12/2022)  Housing: High Risk (09/12/2022)  Transportation Needs: No Transportation Needs (09/12/2022)  Utilities: Not At Risk (09/05/2022)  Alcohol Screen: Low Risk  (02/01/2022)  Depression (PHQ2-9): High Risk (05/13/2022)  Financial Resource Strain: Low Risk  (02/01/2022)  Physical Activity: Insufficiently Active (02/01/2022)  Social Connections: Socially Isolated (02/01/2022)  Stress: No Stress Concern Present (02/01/2022)  Tobacco Use: High Risk (09/05/2022)     Readmission Risk Interventions    09/09/2022   12:06 PM  Readmission Risk Prevention Plan  Transportation Screening Complete   Medication Review (RN Care Manager) Referral to Pharmacy  PCP or Specialist appointment within 3-5 days of discharge Complete  HRI or Home Care Consult Complete  SW Recovery Care/Counseling Consult Complete  Palliative Care Screening Complete  Skilled Nursing Facility Complete

## 2022-09-13 NOTE — Progress Notes (Incomplete)
PROGRESS NOTE SOYINI SPICKERMAN   WJX:[id number] DOB: [birth year]  DOA: 09/05/2022 Date of Service: 09/13/22 PCP: Smitty Cords, DO  Brief Narrative / Hospital Course:  Ms. Annarae Fauser is a 77 year old female with history of depression, anxiety, neuropathy, insomnia, atrial fibrillation, on Eliquis.  They presented to the emergency department for chief concerns of a fall in the morning 09/05/22 - Reports that she tripped over her oxygen cord and fell. She denies loss of consciousness. She denies head trauma. On 3L O2 at baseline. Typically wears at bedtime only. 08/08: to ED. Left lateral malleolus acute fracture that is minimally displaced. Admitted for ortho consult and pain control, may need SNF/STR. Per ortho (Dr Joice Lofts) plan for nonsurgical treatment w/ splint 08/09: PT/OT recs for SNF. Hypotensive, having to hold opiate pain medications, stopped beta blocker and lasix, and reduced gabapentin, melatonin  08/10: Concern for UTI w/ dysuria and abn UA, starting Rocephin. Cr up slightly, BP lower, scheduled midodrine, 1L NS bolus and BP improved only slightly. MAP <60. Hgb trending down but no s/s bleeding, checked again and trending better. No s/s infection other than UTI and not septic. Bradycardic. Holding opiates, reduced or d/c other potentially sedating meds. Given borderline BP not responding to fluids/midodrine and no clear reason for hypotension other than clinically a bit dry and of course polypharmayc, spoke w/ ICU and will transfer to stepdown for closer monitoring. Briefly was on pressors, but SBP improved >100 and patient was mentating ok, per PCCU suspect that low diastolic drives down her MAP / causes it to be artificially low. PCCU also agrees that frailty and recent injury w/ lack of reserve are most likely cause for low BP/weakness.  08/11: BP somewhat better though diastolic remains low, did not require pressors overnight. PCCM s/o.  08/12: BP improved. Hgb remains low  8.5. This AM SVT/Afib RVR, improved w/ diltiazem and on gtt, echo ordered, heparin gtt since she's been off Eliquis d/t anemia and in case needing any procedures / reversal. Cardiology consult. Converted to sinus this afternoon. Echo EF 70-75%, diastolic fxn uncertain, severely elevated pulm artery pressure   08/13: BP improved, per cardio ok to d/c midodrine so will taper off today and probably can stop tomorrow if BP remains ok, resume Lasix for pulm HTN,  8/14-present: stable and awaiting dc to SNF. On baseline oxygen saturation. Continues to work with PT/OT. TOC is engaged in her SNF placement process which is delayed due to insurance issue (see CSW note from 8/14 for further detail).     Consultants:  Orthopedics - ankle fracture  PCCM - hypotension   Procedures: none   ASSESSMENT & PLAN: Acute Left lateral malleolus acute fracture, minimally displaced Mechanical Fall  Orthopedics consulted- conservative nonsurgical management Outpatient f/u Pain control Home Gabapentin 900 mg tid --> 600 tid --> 300 tid Oxycodone low dose if BP ok (SBP >110) Naproxen 500 mg bid ac Tylenol PRN  Afib RVR- s/p dilt gtt. Now rate controlled on metoprolol  Continue metoprolol tartrate 12.5 mg bid. BP is tolerating.  Continue eliquis consider EP consult for [facility] as outpatient  Elevated TSH- 7.029 this admission. May contribute to hard to manage Afib.  - start conservative dose levothyroxine and recheck in outpatient setting out of acute illness  H/o CVA with mild residual L hemiparesis- contributed to initial fall. Patient states she is at her baseline strength and function currently from that previous stroke. Increased fall risk now with ankle fracture  - continue PT/OT  Essential HTN, but hypotensive here Home furosemide and metoprolol were then held d/t low BP --> 8/12 restart metoprolol following Afib RVR,  8/13 restart furosemide 20 mg IV BID Weaned off midodrine, hypotension has  resolved.   UTI- s/p treatment   AKI - resolved Likely poor po intake +/- UTI Trend BMP   HLD (hyperlipidemia) Home atorvastatin 40 mg nightly held d/t liver enzymes but this trending down, can likely resume at discharge / outpatient    COPD/Asthma  Pulmonary hypertension (HCC)- stable at baseline  Albuterol prn Breo + Incruse, Singulair   HFpEF  Patient wears 3 L [place] continuously Continue O2 support  Continue home furosemide 20 mg IV BID; titration as needed.   Chronic pain syndrome Pain control for ankle fracture - see above   Major depressive disorder, recurrent, severe w/o psychotic behavior (HCC) Anxiety and depression Home quetiapine 250 mg (confirmed filled with pharmacy) at bedtime held home fluoxetine 20 mg dialy resumed    Hypokalemia Replace as needed Monitor BMP    DVT prophylaxis: eliquis Pertinent IV fluids/nutrition: no continuous IV fluids  Central lines / invasive devices: none    Code Status: FULL CODE ACP documentation reviewed   Current Admission Status: inpatient   TOC needs / Dispo plan: SNF STR Barriers to discharge / significant pending items: placement, insurance clearance   Subjective / Brief ROS:  Pt reports feeling well today. She is ready to leave the hospital and upset about the insurance issues preventing her transfer. Denies pain at rest.   Family Communication: none at bedside  Objective Findings: Blood pressure (!) 115/51, pulse 74, temperature 98.3 F (36.8 C), temperature source Oral, resp. rate 20, height 5\' 5"  (1.651 m), weight 65 kg, SpO2 90%.  Examination:  Physical Exam Constitutional:      General: She is not in acute distress.    Appearance: She is normal weight. She is not ill-appearing or toxic-appearing.  HENT:     Mouth/Throat:     Mouth: Mucous membranes are moist.  Cardiovascular:     Rate and Rhythm: Normal rate and regular rhythm.     Heart sounds: Normal heart sounds. No murmur heard. Pulmonary:      Effort: Pulmonary effort is normal.     Breath sounds: Normal breath sounds.  Abdominal:     General: Abdomen is flat.     Palpations: Abdomen is soft.     Tenderness: There is no abdominal tenderness.  Musculoskeletal:     Right lower leg: No edema.     Left lower leg: No edema.  Skin:    General: Skin is warm and dry.     Capillary Refill: Capillary refill takes less than 2 seconds.     Findings: No rash.  Neurological:     General: No focal deficit present.     Mental Status: She is alert and oriented to person, place, and time.  Psychiatric:        Mood and Affect: Mood normal.        Behavior: Behavior normal.  Data Reviewed:  I have personally reviewed the following...  CBC: Recent Labs  Lab 09/07/22 0449 09/07/22 1349 09/09/22 0352 09/10/22 0641 09/11/22 0509 09/12/22 0606  WBC 8.9  --  8.9 10.5 9.5 9.1  NEUTROABS 4.8  --   --   --   --   --   HGB 8.8* 9.3* 8.5* 9.3* 8.0* 8.3*  HCT 26.9* 28.6* 26.1* 29.6* 24.2* 26.1*  MCV 96.1  --  96.7 97.7 94.9 97.0  PLT 289  --  304 290 268 275   Basic Metabolic Panel: Recent Labs  Lab 09/07/22 0449 09/08/22 0510 09/08/22 0514 09/09/22 0352 09/10/22 0641 09/11/22 0509 09/12/22 0606  NA 140  --  140 144 143 141 143  K 3.2*  --  3.2* 3.7 4.1 3.2* 4.0  CL 105  --  109 112* 109 105 107  CO2 29  --  24 26 26 30 29   GLUCOSE 100*  --  92 99 117* 112* 101*  BUN 26*  --  20 16 13 13 11   CREATININE 1.19*  --  0.74 0.79 0.75 0.77 0.80  CALCIUM 8.7*  --  8.5* 8.7* 9.2 8.8* 8.7*  MG 1.9 1.7  --   --   --   --   --    GFR: Estimated Creatinine Clearance: 52.2 mL/min (by C-G formula based on SCr of 0.8 mg/dL). Liver Function Tests: Recent Labs  Lab 09/07/22 0449 09/09/22 0352 09/10/22 0641  AST 93* 35 27  ALT 118* 64* 55*  ALKPHOS 168* 129* 137*  BILITOT 0.6 0.8 0.5  PROT 5.6* 5.4* 6.4*  ALBUMIN 2.3* 2.4* 2.7*   Thyroid Function Tests: No results for input(s): "TSH", "T4TOTAL", "FREET4", "T3FREE", "THYROIDAB" in  the last 72 hours.  Radiology Studies last 3 days: ECHOCARDIOGRAM COMPLETE  Result Date: 09/09/2022    ECHOCARDIOGRAM REPORT   Patient Name:   AALIA FOLKES Date of Exam: 09/09/2022 Medical Rec #:  [id number]        Height:       65.0 in Accession #:    [id number]       Weight:       144.2 lb Date of Birth:  [birth year]        BSA:          1.721 m Patient Age:    76 years         BP:           126/64 mmHg Patient Gender: F                HR:           64 bpm. Exam Location:  [facility] Procedure: 2D Echo, Cardiac Doppler and Color Doppler Indications:     Atrial Fibrillation I48.91  History:         Patient has prior history of Echocardiogram examinations, most                  recent 08/26/2022. COPD; Risk Factors:Hypertension and                  Dyslipidemia.  Sonographer:     Cristela Blue Referring Phys:  [id number] Sunnie Nielsen Diagnosing Phys: Yvonne Kendall MD IMPRESSIONS  1. Left ventricular ejection fraction, by estimation, is 70 to 75%. The left ventricle has hyperdynamic function. Left ventricular endocardial border not optimally defined to evaluate regional wall motion. There is mild left ventricular hypertrophy. Left ventricular diastolic function could not be evaluated.  2. Pulmonary artery pressure is moderately to severely elevated (RVSP 50-55 mmHg plus central venous/right atrial pressure).. Right ventricular systolic function is normal. The right ventricular size is normal.  3. The mitral valve is degenerative. Mild mitral valve regurgitation. No evidence of mitral stenosis. Moderate mitral annular calcification.  4. Tricuspid valve regurgitation is moderate to severe.  5. The aortic valve was not well visualized. Aortic valve regurgitation is not visualized. No aortic stenosis is  present.  6. Pulmonic valve regurgitation not well-assessed. FINDINGS  Left Ventricle: Left ventricular ejection fraction, by estimation, is 70 to 75%. The left ventricle has hyperdynamic function. Left ventricular  endocardial border not optimally defined to evaluate regional wall motion. The left ventricular internal cavity size was normal in size. There is mild left ventricular hypertrophy. Left ventricular diastolic function could not be evaluated due to atrial fibrillation. Left ventricular diastolic function could not be evaluated. Right Ventricle: Pulmonary artery pressure is moderately to severely elevated (RVSP 50-55 mmHg plus central venous/right atrial pressure). The right ventricular size is normal. Right vetricular wall thickness was not well visualized. Right ventricular systolic function is normal. Left Atrium: Left atrial size was normal in size. Right Atrium: Right atrial size was normal in size. Pericardium: Trivial pericardial effusion is present. Mitral Valve: The mitral valve is degenerative in appearance. There is mild thickening of the mitral valve leaflet(s). Moderate mitral annular calcification. Mild mitral valve regurgitation. No evidence of mitral valve stenosis. Tricuspid Valve: The tricuspid valve is not well visualized. Tricuspid valve regurgitation is moderate to severe. Aortic Valve: The aortic valve was not well visualized. Aortic valve regurgitation is not visualized. No aortic stenosis is present. Aortic valve mean gradient measures 5.0 mmHg. Aortic valve peak gradient measures 7.2 mmHg. Aortic valve area, by VTI measures 2.64 cm. Pulmonic Valve: The pulmonic valve was not well visualized. Pulmonic valve regurgitation not well-assessed. Aorta: The aortic root is normal in size and structure. Pulmonary Artery: The pulmonary artery is not well seen. Venous: The inferior vena cava was not well visualized. IAS/Shunts: The interatrial septum was not well visualized.  LEFT VENTRICLE PLAX 2D LVIDd:         3.80 cm LVIDs:         2.30 cm LV PW:         1.10 cm LV IVS:        1.30 cm LVOT diam:     2.00 cm LV SV:         59 LV SV Index:   34 LVOT Area:     3.14 cm  RIGHT VENTRICLE RV Basal diam:   2.90 cm RV Mid diam:    2.60 cm LEFT ATRIUM             Index        RIGHT ATRIUM           Index LA diam:        4.30 cm 2.50 cm/m   RA Area:     12.80 cm LA Vol (A2C):   54.7 ml 31.78 ml/m  RA Volume:   28.70 ml  16.67 ml/m LA Vol (A4C):   36.7 ml 21.32 ml/m LA Biplane Vol: 48.6 ml 28.23 ml/m  AORTIC VALVE AV Area (Vmax):    2.28 cm AV Area (Vmean):   2.11 cm AV Area (VTI):     2.64 cm AV Vmax:           134.00 cm/s AV Vmean:          102.200 cm/s AV VTI:            0.222 m AV Peak Grad:      7.2 mmHg AV Mean Grad:      5.0 mmHg LVOT Vmax:         97.40 cm/s LVOT Vmean:        68.700 cm/s LVOT VTI:          0.187 m LVOT/AV VTI  ratio: 0.84  AORTA Ao Root diam: 2.80 cm MITRAL VALVE                TRICUSPID VALVE MV Area (PHT): 5.20 cm     TR Peak grad:   53.6 mmHg MV Decel Time: 146 msec     TR Vmax:        366.00 cm/s MV E velocity: 136.00 cm/s                             SHUNTS                             Systemic VTI:  0.19 m                             Systemic Diam: 2.00 cm Yvonne Kendall MD Electronically signed by Yvonne Kendall MD Signature Date/Time: 09/09/2022/4:25:14 PM    Final       LOS: 8 days    Leeroy Bock, DO [facility] 09/13/2022, 8:12 AM   If 7PM-7AM, please contact night coverage [email or web address]

## 2022-09-13 NOTE — ED Provider Notes (Signed)
[facility] Provider Note    Event Date/Time   First MD Initiated Contact with Patient 09/13/22 2206     (approximate)   History   Weakness   HPI  Beth [name] is a 77 y.o. female   with history of CVA, A-fib on Eliquis, chronic respiratory failure with hypoxia on 3 L O2 at baseline presenting to the emergency department for evaluation of fever.  Patient was discharged from the hospital today.  Reportedly had a fever at her facility, unknown temperature and was given Tylenol.  Patient does report some weakness and shortness of breath, but says this is not significantly changed when she was discharged earlier today.  I did review her discharge summary from earlier today.  She initially presented after a fall with a lateral malleolus fracture.  Her inpatient stay was complicated by hypotension in the setting of opioid use.  She was treated for a UTI and hypothyroidism.  She was discharged on continuous oxygen.    Physical Exam   Triage Vital Signs: ED Triage Vitals  Encounter Vitals Group     BP --      Systolic BP Percentile --      Diastolic BP Percentile --      Pulse Rate 09/13/22 2209 74     Resp --      Temp 09/13/22 2209 99.1 F (37.3 C)     Temp Source 09/13/22 2209 Oral     SpO2 09/13/22 2209 91 %     Weight 09/13/22 2212 135 lb (61.2 kg)     Height 09/13/22 2212 5\' 5"  (1.651 m)     Head Circumference --      Peak Flow --      Pain Score 09/13/22 2211 6     Pain Loc --      Pain Education --      Exclude from Growth Chart --     Most recent vital signs: Vitals:   09/13/22 2249 09/13/22 2320  BP: (!) 103/47 (!) 100/41  Pulse: 73 66  Resp: (!) 21 20  Temp:    SpO2: 92% 91%     General: Awake, interactive  CV:  Regular rate, good peripheral perfusion.  Resp:  Lung sounds mildly diminished bilaterally without significant appreciable wheezing, respirations not significantly labored Abd:  Soft, nondistended.  Neuro:  Symmetric  facial movement, fluid speech   ED Results / Procedures / Treatments   Labs (all labs ordered are listed, but only abnormal results are displayed) Labs Reviewed  CBC WITH DIFFERENTIAL/PLATELET - Abnormal; Notable for the following components:      Result Value   WBC 11.3 (*)    RBC 2.90 (*)    Hemoglobin 8.9 (*)    HCT 27.8 (*)    nRBC 0.6 (*)    Monocytes Absolute 1.1 (*)    Eosinophils Absolute 0.7 (*)    Abs Immature Granulocytes 0.08 (*)    All other components within normal limits  COMPREHENSIVE METABOLIC PANEL - Abnormal; Notable for the following components:   Potassium 2.9 (*)    Glucose, Bld 177 (*)    Calcium 8.4 (*)    Total Protein 6.2 (*)    Albumin 2.4 (*)    All other components within normal limits  D-DIMER, QUANTITATIVE - Abnormal; Notable for the following components:   D-Dimer, Quant 1.89 (*)    All other components within normal limits  CBG MONITORING, ED - Abnormal; Notable for the following  components:   Glucose-Capillary 165 (*)    All other components within normal limits  URINALYSIS, ROUTINE W REFLEX MICROSCOPIC     EKG EKG independently reviewed interpreted by myself (ER attending) demonstrates:    RADIOLOGY Imaging independently reviewed and interpreted by myself demonstrates:    PROCEDURES:  Critical Care performed: No  Procedures   MEDICATIONS ORDERED IN ED: Medications  iohexol (OMNIPAQUE) 350 MG/ML injection 75 mL (has no administration in time range)  potassium chloride SA (KLOR-CON M) CR tablet 40 mEq (has no administration in time range)  sodium chloride 0.9 % bolus 500 mL (500 mLs Intravenous New Bag/Given 09/13/22 2301)     IMPRESSION / MDM / ASSESSMENT AND PLAN / ED COURSE  I reviewed the triage vital signs and the nursing notes.  Differential diagnosis includes, but is not limited to, pneumonia, COPD exacerbation, pulmonary embolism, UTI, anemia, electrolyte abnormality  Patient's presentation is most consistent with  acute presentation with potential threat to life or bodily function.  77 year old female presenting to the emergency department with ongoing shortness of breath and weakness with reported fever in her facility, unknown temperature.  Afebrile here with normal heart rate.  Will obtain repeat labs including a D-dimer given her recent fracture with hospitalization and x-[place]ola Truax to further evaluate.  Patient does feel comfortable returning to her facility if her workup is reassuring.  CBC with slight leukocytosis, stable anemia compared to earlier today.  Patient does have worsening hypokalemia with K of 2.9.  Oral repletion ordered.  D-dimer did return significantly elevated at 1.89.  CTA of the chest ordered.  Signed out to oncoming provider pending completion of workup, reevaluation, and disposition.     FINAL CLINICAL IMPRESSION(S) / ED DIAGNOSES   Final diagnoses:  Shortness of breath     Rx / DC Orders   ED Discharge Orders     None        Note:  This document was prepared using Dragon voice recognition software and may include unintentional dictation errors.   Trinna Post, MD 09/13/22 [phone]

## 2022-09-13 NOTE — Plan of Care (Signed)

## 2022-09-13 NOTE — ED Notes (Signed)
Recent ankle fracture - 39mo prior.

## 2022-09-13 NOTE — Patient Instructions (Signed)
Visit Information  Thank you for taking time to visit with me today. Please don't hesitate to contact me if I can be of assistance to you.   Following are the goals we discussed today:  1.CSW to follow up with you following your discharge from the short term rehab to continue to assess for community resource needs   Our next appointment is by telephone on 10/11/22 at 1pm  Please call the care guide team at [phone] if you need to cancel or reschedule your appointment.   If you are experiencing a Mental Health or Behavioral Health Crisis or need someone to talk to, please call 911   Patient verbalizes understanding of instructions and care plan provided today and agrees to view in MyChart. Active MyChart status and patient understanding of how to access instructions and care plan via MyChart confirmed with patient.     Telephone follow up appointment with care management team member scheduled for: 10/11/22  Verna Czech, LCSW Clinical Social Worker  [facility] Care Management [phone]

## 2022-09-13 NOTE — Care Management Important Message (Signed)
Important Message  Patient Details  Name: Beth [name] MRN: [id number] Date of Birth: [birth year]   Medicare Important Message Given:  Yes     Johnell Comings 09/13/2022, 9:55 AM

## 2022-09-14 DIAGNOSIS — Z7401 Bed confinement status: Secondary | ICD-10-CM | POA: Diagnosis not present

## 2022-09-14 DIAGNOSIS — R531 Weakness: Secondary | ICD-10-CM | POA: Diagnosis not present

## 2022-09-14 MED ORDER — SODIUM CHLORIDE 0.9 % IV BOLUS
250.0000 mL | Freq: Once | INTRAVENOUS | Status: AC
  Administered 2022-09-14: 250 mL via INTRAVENOUS

## 2022-09-14 MED ORDER — POTASSIUM CHLORIDE CRYS ER 20 MEQ PO TBCR
40.0000 meq | EXTENDED_RELEASE_TABLET | Freq: Once | ORAL | Status: AC
  Administered 2022-09-14: 40 meq via ORAL
  Filled 2022-09-14: qty 2

## 2022-09-14 NOTE — ED Notes (Signed)
[facility] called for transport to [facility]

## 2022-09-14 NOTE — ED Notes (Signed)
Blood pressure improved after fluids - 110/55 (69) MD aware. Patient states she feels better, ready to go home.  Okay to discharge.

## 2022-09-14 NOTE — ED Provider Notes (Signed)
Patient received in signout from Dr. Rosalia Hammers pending CTA chest to evaluate for PE.  This study returns negative, with stigmata of emphysema and small bilateral pleural effusions.  I reassessed the patient and she is on her chronic oxygen and reports feeling okay.  Oral replacement of hypokalemia is ordered and she is suitable for return to her facility, per original plan of care.   Delton Prairie, MD 09/14/22 [phone]

## 2022-09-16 ENCOUNTER — Telehealth: Payer: 59

## 2022-09-16 ENCOUNTER — Other Ambulatory Visit: Payer: 59

## 2022-09-16 DIAGNOSIS — I10 Essential (primary) hypertension: Secondary | ICD-10-CM | POA: Diagnosis not present

## 2022-09-16 NOTE — Patient Instructions (Signed)
Visit Information  Thank you for taking time to visit with me today. Please don't hesitate to contact me if I can be of assistance to you before our next scheduled telephone appointment.  Following are the goals we discussed today:  PCP Follow-up appointment confirmed?: No MD Provider Line Number:[phone] Given: Yes Specialist Hospital Follow-up appointment confirmed?: No Reason Specialist Follow-Up Not Confirmed: Patient has Specialist Provider Number and will Call for Appointment (is being managed at the rehab facility) Do you understand care options if your condition(s) worsen?: Yes-patient verbalized understanding  Our next appointment is by telephone on 10-17-2022 at 1 pm  Please call the care guide team at [phone] if you need to cancel or reschedule your appointment.   If you are experiencing a Mental Health or Behavioral Health Crisis or need someone to talk to, please call the Suicide and Crisis Lifeline: 988 call the [place] National Suicide Prevention Lifeline: 440-030-5364 or TTY: 773 227 1217 TTY 214-251-8881) to talk to a trained counselor call 1-800-273-TALK (toll free, 24 hour hotline)   Patient verbalizes understanding of instructions and care plan provided today and agrees to view in MyChart. Active MyChart status and patient understanding of how to access instructions and care plan via MyChart confirmed with patient.       Alto Denver RN, MSN, CCM RN Care Manager  [facility]  Ambulatory Care Management  Direct Number: [phone]

## 2022-09-16 NOTE — Transitions of Care (Post Inpatient/ED Visit) (Signed)
09/16/2022  Name: Beth [name] MRN: [id number] DOB: 1945/02/20  Today's TOC FU Call Status: Today's TOC FU Call Status:: Successful TOC FU Call Completed TOC FU Call Complete Date: 09/16/22  Transition Care Management Follow-up Telephone Call Date of Discharge: 09/13/22 Discharge Facility: [facility]) Type of Discharge: Emergency Department Primary Inpatient Discharge Diagnosis:: inpatient stay with discharge due to fracture of left ankle, and ER for shortness of breath Reason for ED Visit: Respiratory, Other: (fracture of left ankle and shortness of breath) Respiratory Diagnosis: COPD Exacerbation How have you been since you were released from the hospital?: Better Any questions or concerns?: No  Items Reviewed: Did you receive and understand the discharge instructions provided?: Yes Medications obtained,verified, and reconciled?: No (patient discharged to rehab facility) Medications Not Reviewed Reasons:: Other: (the patient is getting medications from the rehab facility) Any new allergies since your discharge?: No Dietary orders reviewed?: No Do you have support at home?: Yes (the patient has adult children but currenlty in the rehab facility) People in Home: facility resident (currently in rehab post hospital stay) Name of Support/Comfort Primary Source: rehab facility, son and daughter  Medications Reviewed Today: Medications Reviewed Today     Reviewed by Marlowe Sax, RN (Case Manager) on 09/16/22 at 1447  Med List Status: <None>   Medication Order Taking? Sig Documenting Provider Last Dose Status Informant  acetaminophen (TYLENOL) 500 MG tablet [id number] No Take 500 mg by mouth 2 (two) times daily as needed. [provider] prn unk Active Child, Pharmacy Records  albuterol (VENTOLIN HFA) 108 (90 Base) MCG/ACT inhaler [id number] No Inhale 1-2 puffs into the lungs every 6 (six) hours as needed for wheezing or shortness of breath.  Smitty Cords, DO prn unk Active Child, Pharmacy Records  amiodarone (PACERONE) 200 MG tablet [id number]  Take 1 tablet (200 mg total) by mouth 2 (two) times daily for 12 days, THEN 1 tablet (200 mg total) daily. Leeroy Bock, MD  Active   apixaban (ELIQUIS) 5 MG TABS tablet [id number] No Take 1 tablet (5 mg total) by mouth 2 (two) times daily. Phineas Semen, MD 09/04/2022 2000 Active Child, Pharmacy Records  atorvastatin (LIPITOR) 40 MG tablet [id number] No TAKE 1 TABLET BY MOUTH EVERY DAY  Patient taking differently: Take 40 mg by mouth at bedtime. TAKE 1 TABLET BY MOUTH EVERY DAY   Smitty Cords, DO 09/04/2022 2000 Active Child, Pharmacy Records           Med Note Janeann Forehand Aug 25, 2022  2:57 PM)    clobetasol ointment (TEMOVATE) 0.05 % [id number] No Apply 1 Application topically daily. [provider] prn unk Active Child, Pharmacy Records           Med Note [facility]   Thu Sep 05, 2022  6:22 PM) Uses as needed  FLUoxetine (PROZAC) 20 MG capsule [id number] No Take 1 capsule (20 mg total) by mouth daily. Loyce Dys, MD 09/04/2022 pm Active Child, Pharmacy Records  furosemide (LASIX) 40 MG tablet [id number]  Take 1 tablet (40 mg total) by mouth daily. If swelling is less, can take only 1 pill. Leeroy Bock, MD  Active   gabapentin (NEURONTIN) 100 MG capsule [id number]  Take 1 capsule (100 mg total) by mouth 3 (three) times daily. Leeroy Bock, MD  Active   levothyroxine (SYNTHROID) 25 MCG tablet [id number]  Take 1 tablet (25 mcg total) by mouth daily at 6 (six)  AM. Leeroy Bock, MD  Active   melatonin 5 MG TABS [id number]  Take 1 tablet (5 mg total) by mouth at bedtime. Leeroy Bock, MD  Active   metoprolol tartrate (LOPRESSOR) 25 MG tablet [id number]  Take 0.5 tablets (12.5 mg total) by mouth 2 (two) times daily. Leeroy Bock, MD  Active   montelukast (SINGULAIR) 10 MG tablet [id number]  TAKE 1 TABLET(10 MG) BY  MOUTH AT BEDTIME Karamalegos, Netta Neat, DO  Active   montelukast (SINGULAIR) 10 MG tablet [id number]  Take 1 tablet (10 mg total) by mouth at bedtime. Leeroy Bock, MD  Active   oxyCODONE (OXY IR/ROXICODONE) 5 MG immediate release tablet [id number]  Take 1 tablet (5 mg total) by mouth every 6 (six) hours as needed for up to 5 days for severe pain or breakthrough pain. Leeroy Bock, MD  Active   polyethylene glycol (MIRALAX / GLYCOLAX) 17 g packet [id number]  Take 17 g by mouth daily as needed for mild constipation. Leeroy Bock, MD  Active   traZODone (DESYREL) 50 MG tablet [id number]  Take 1 tablet (50 mg total) by mouth at bedtime. Leeroy Bock, MD  Active   [facility] ELLIPTA 200-62.5-25 MCG/ACT AEPB [id number] No INHALE 1 PUFF INTO THE LUNGS DAILY Salena Saner, MD Past Week Active Child, Pharmacy Records  Med List Note Nonah Mattes, RN 02/12/18 1055): Medication agreement signed 02/12/2018 UDS 02/24/17 03/25/17 PA requested for gabapentin sent SB             Home Care and Equipment/Supplies: Were Home Health Services Ordered?: NA  Functional Questionnaire: Do you need assistance with bathing/showering or dressing?: Yes (in rehab facility, working with PT) Do you need assistance with meal preparation?: Yes (rehab preparing meals) Do you need assistance with eating?: No Do you have difficulty maintaining continence: No Do you need assistance with getting out of bed/getting out of a chair/moving?: Yes (has a fracture to left ankle) Do you have difficulty managing or taking your medications?: No  Follow up appointments reviewed: PCP Follow-up appointment confirmed?: No MD Provider Line Number:[phone] Given: Yes Specialist Hospital Follow-up appointment confirmed?: No Reason Specialist Follow-Up Not Confirmed: Patient has Specialist Provider Number and will Call for Appointment (is being managed at the rehab facility) Do you understand care options  if your condition(s) worsen?: Yes-patient verbalized understanding  SDOH Interventions Today    Flowsheet Row Most Recent Value  SDOH Interventions   Health Literacy Interventions Intervention Not Indicated      Alto Denver RN, MSN, CCM RN Care Manager  [facility]  Ambulatory Care Management  Direct Number: [phone]

## 2022-09-17 ENCOUNTER — Telehealth: Payer: Self-pay | Admitting: *Deleted

## 2022-09-17 DIAGNOSIS — G894 Chronic pain syndrome: Secondary | ICD-10-CM | POA: Diagnosis not present

## 2022-09-17 DIAGNOSIS — E039 Hypothyroidism, unspecified: Secondary | ICD-10-CM | POA: Diagnosis not present

## 2022-09-17 DIAGNOSIS — J45909 Unspecified asthma, uncomplicated: Secondary | ICD-10-CM | POA: Diagnosis not present

## 2022-09-17 DIAGNOSIS — S8262XD Displaced fracture of lateral malleolus of left fibula, subsequent encounter for closed fracture with routine healing: Secondary | ICD-10-CM | POA: Diagnosis not present

## 2022-09-17 DIAGNOSIS — G4700 Insomnia, unspecified: Secondary | ICD-10-CM | POA: Diagnosis not present

## 2022-09-17 DIAGNOSIS — E559 Vitamin D deficiency, unspecified: Secondary | ICD-10-CM | POA: Diagnosis not present

## 2022-09-17 DIAGNOSIS — Z7901 Long term (current) use of anticoagulants: Secondary | ICD-10-CM | POA: Diagnosis not present

## 2022-09-17 DIAGNOSIS — U071 COVID-19: Secondary | ICD-10-CM | POA: Diagnosis not present

## 2022-09-17 DIAGNOSIS — K589 Irritable bowel syndrome without diarrhea: Secondary | ICD-10-CM | POA: Diagnosis not present

## 2022-09-17 DIAGNOSIS — I10 Essential (primary) hypertension: Secondary | ICD-10-CM | POA: Diagnosis not present

## 2022-09-17 DIAGNOSIS — J9611 Chronic respiratory failure with hypoxia: Secondary | ICD-10-CM | POA: Diagnosis not present

## 2022-09-17 DIAGNOSIS — J4489 Other specified chronic obstructive pulmonary disease: Secondary | ICD-10-CM | POA: Diagnosis not present

## 2022-09-17 DIAGNOSIS — I4891 Unspecified atrial fibrillation: Secondary | ICD-10-CM | POA: Diagnosis not present

## 2022-09-17 DIAGNOSIS — I69359 Hemiplegia and hemiparesis following cerebral infarction affecting unspecified side: Secondary | ICD-10-CM | POA: Diagnosis not present

## 2022-09-17 DIAGNOSIS — R5381 Other malaise: Secondary | ICD-10-CM | POA: Diagnosis not present

## 2022-09-17 DIAGNOSIS — M797 Fibromyalgia: Secondary | ICD-10-CM | POA: Diagnosis not present

## 2022-09-17 DIAGNOSIS — G629 Polyneuropathy, unspecified: Secondary | ICD-10-CM | POA: Diagnosis not present

## 2022-09-17 NOTE — Patient Instructions (Signed)
Visit Information  Thank you for taking time to visit with me today. Please don't hesitate to contact me if I can be of assistance to you.   Following are the goals we discussed today:  Please follow up with the social worker at [facility] to discuss options for long term care   Our next appointment is by telephone on 10/11/22 at 1pm  Please call the care guide team at [phone] if you need to cancel or reschedule your appointment.   If you are experiencing a Mental Health or Behavioral Health Crisis or need someone to talk to, please call 911   Patient verbalizes understanding of instructions and care plan provided today and agrees to view in MyChart. Active MyChart status and patient understanding of how to access instructions and care plan via MyChart confirmed with patient.     Telephone follow up appointment with care management team member scheduled for: 10/11/22  Verna Czech, LCSW Clinical Social Worker  [facility] Care Management [phone]

## 2022-09-17 NOTE — Patient Outreach (Signed)
  Care Coordination   Follow Up Visit Note   09/17/2022 Name: Beth [name] MRN: [id number] DOB: 04/02/1945  Beth [name] is a 77 y.o. year old female who sees Smitty Cords, DO for primary care. I spoke with  Beth [name]'s daughter [name] by phone today.  What matters to the patients health and wellness today?  Patient's daughter requesting assistance with long term care placement for patient. Patient's daughter agreeable to contacting the social worker at the facility to discuss long term care options.    Goals Addressed             This Visit's Progress    long term care options       Interventions Today    Flowsheet Row Most Recent Value  Chronic Disease   Chronic disease during today's visit Other  [chronic pain]  General Interventions   General Interventions Discussed/Reviewed General Interventions Discussed, General Interventions Reviewed, Level of Care  Level of Care Skilled Nursing Facility  [patient now in short-term rehab at [facility]-patient's daughter concerned that she will not be able to reside on her own independently-daughter states that she is not able to provide care and is requesting assistance with long term care process.]  Education Interventions   Education Provided Provided Education  Provided Verbal Education On Other  [long term care placement process]  Safety Interventions   Safety Discussed/Reviewed Safety Discussed              SDOH assessments and interventions completed:  No     Care Coordination Interventions:  Yes, provided   Follow up plan: Follow up call scheduled for 10/21/22    Encounter Outcome:  Pt. Visit Completed

## 2022-09-18 ENCOUNTER — Ambulatory Visit: Payer: 59 | Admitting: Family Medicine

## 2022-09-18 DIAGNOSIS — N390 Urinary tract infection, site not specified: Secondary | ICD-10-CM | POA: Diagnosis not present

## 2022-09-19 ENCOUNTER — Telehealth: Payer: Self-pay | Admitting: *Deleted

## 2022-09-19 NOTE — Patient Instructions (Signed)
Visit Information  Thank you for taking time to visit with me today. Please don't hesitate to contact me if I can be of assistance to you.   Following are the goals we discussed today:  Please follow up with [facility] social worker to assist with long term care placement. CSW to follow up with [facility] case manager to assist with long term care process  Our next appointment is by telephone on 10/11/22 at 1pm  Please call the care guide team at [phone] if you need to cancel or reschedule your appointment.   If you are experiencing a Mental Health or Behavioral Health Crisis or need someone to talk to, please call 911   Patient verbalizes understanding of instructions and care plan provided today and agrees to view in MyChart. Active MyChart status and patient understanding of how to access instructions and care plan via MyChart confirmed with patient.     Telephone follow up appointment with care management team member scheduled for: 10/11/22  Verna Czech, LCSW Clinical Social Worker  [phone]

## 2022-09-19 NOTE — Patient Outreach (Signed)
  Care Coordination   Follow Up Visit Note   09/19/2022 Name: MAME ESSELMAN MRN: [id number] DOB: 02/13/1945  DEVETTE GABIN is a 77 y.o. year old female who sees Smitty Cords, DO for primary care. I spoke with  Leland Johns by phone today.  What matters to the patients health and wellness today?  Long term care placement. Patient currently in short term rehab, daughter would like long term care options due to patient's difficulty managing her health needs in the home.    Goals Addressed             This Visit's Progress    long term care options       Interventions Today    Flowsheet Row Most Recent Value  Chronic Disease   Chronic disease during today's visit Other  [chronic pain]  General Interventions   General Interventions Discussed/Reviewed General Interventions Discussed, General Interventions Reviewed, Level of Care  Level of Care Skilled Nursing Facility, Applications  [patient's daughter discussed concerns related to patient's daily care post discharge from the rehab facility. She confirms f/u with the facility social worker who will confirm patient 's medicaid for long term care purposes.]  Applications Medicaid  [discussed need for patient's medicaid to be transitioned to long term care medicaid for placement purposes]  Education Interventions   Education Provided Provided Education  Provided Verbal Education On Other  [daughter educated on long term care application process]  Applications Medicaid  [discussed need for patient's medicaid to be transitioned to long term care medicaid for placement purposes]              SDOH assessments and interventions completed:  No     Care Coordination Interventions:  Yes, provided   Follow up plan: Follow up call scheduled for 09/26/22    Encounter Outcome:  Pt. Visit Completed

## 2022-09-24 NOTE — Progress Notes (Signed)
I would describe this as an osteoporotic pathologic fracture of her ankle.  Thank you!

## 2022-09-27 ENCOUNTER — Telehealth: Payer: Self-pay | Admitting: Pharmacist

## 2022-09-27 ENCOUNTER — Telehealth: Payer: 59

## 2022-09-27 NOTE — Telephone Encounter (Signed)
   Outreach Note  09/27/2022 Name: Beth [name] MRN: [id number] DOB: [birth year]  Referred by: Smitty Cords, DO  Was unable to reach patient via telephone today and have left HIPAA compliant voicemail asking patient to return my call.   From review of chart, per notes from [facility] LCSW, patient is currently in SNF  Follow Up Plan: Will attempt to reach patient by telephone again next month  Estelle Grumbles, PharmD, Patsy [place], CPP Clinical Pharmacist [facility] [phone]

## 2022-10-03 DIAGNOSIS — E782 Mixed hyperlipidemia: Secondary | ICD-10-CM | POA: Diagnosis not present

## 2022-10-03 DIAGNOSIS — I4891 Unspecified atrial fibrillation: Secondary | ICD-10-CM | POA: Diagnosis not present

## 2022-10-03 DIAGNOSIS — I10 Essential (primary) hypertension: Secondary | ICD-10-CM | POA: Diagnosis not present

## 2022-10-04 DIAGNOSIS — S82832A Other fracture of upper and lower end of left fibula, initial encounter for closed fracture: Secondary | ICD-10-CM | POA: Diagnosis not present

## 2022-10-04 DIAGNOSIS — M25572 Pain in left ankle and joints of left foot: Secondary | ICD-10-CM | POA: Diagnosis not present

## 2022-10-11 ENCOUNTER — Ambulatory Visit: Payer: Self-pay | Admitting: *Deleted

## 2022-10-11 NOTE — Patient Instructions (Signed)
Visit Information  Thank you for taking time to visit with me today. Please don't hesitate to contact me if I can be of assistance to you.    If you are experiencing a Mental Health or Behavioral Health Crisis or need someone to talk to, please call 911   The patient verbalized understanding of instructions, educational materials, and care plan provided today and DECLINED offer to receive copy of patient instructions, educational materials, and care plan.   No further follow up required: please call this Child psychotherapist with any additional community resource needs  [employer] [place]  [facility], [facility] Licensed Clinical Social Geologist, engineering Dial: [phone]

## 2022-10-11 NOTE — Patient Outreach (Addendum)
Care Coordination   Follow Up Visit Note   10/11/2022 Name: Beth [name] MRN: [id number] DOB: 1945/06/09  Beth [name] is a 77 y.o. year old female who sees Smitty Cords, DO for primary care. I spoke with  Beth [name]'s daughter  by phone today.  What matters to the patients health and wellness today?  Need for 24 hour care. Patient's daughter confirms that patient will remain at [facility] for long term care. Patient's medicaid status has now been changed to long termcare   Goals Addressed             This Visit's Progress    care coordination activities       Interventions Today    Flowsheet Row Most Recent Value  Chronic Disease   Chronic disease during today's visit Chronic Obstructive Pulmonary Disease (COPD), Chronic Kidney Disease/End Stage Renal Disease (ESRD)  General Interventions   General Interventions Discussed/Reviewed General Interventions Reviewed, Level of Care  Level of Care Skilled Nursing Facility  [confirmed patient's status while in rehab, rehab coming to an end, patient improving-family met with social worker-change of program completed with Medicaid-patient to remain at [facility] under long term care-pt's need for 24 hour care reinforced]              SDOH assessments and interventions completed:  No     Care Coordination Interventions:  Yes, provided   Follow up plan: No further intervention required.   Encounter Outcome:  Patient Visit Completed

## 2022-10-14 DIAGNOSIS — J4489 Other specified chronic obstructive pulmonary disease: Secondary | ICD-10-CM | POA: Diagnosis not present

## 2022-10-14 DIAGNOSIS — S91302A Unspecified open wound, left foot, initial encounter: Secondary | ICD-10-CM | POA: Diagnosis not present

## 2022-10-14 DIAGNOSIS — J9611 Chronic respiratory failure with hypoxia: Secondary | ICD-10-CM | POA: Diagnosis not present

## 2022-10-14 DIAGNOSIS — G8929 Other chronic pain: Secondary | ICD-10-CM | POA: Diagnosis not present

## 2022-10-14 DIAGNOSIS — S8262XD Displaced fracture of lateral malleolus of left fibula, subsequent encounter for closed fracture with routine healing: Secondary | ICD-10-CM | POA: Diagnosis not present

## 2022-10-14 DIAGNOSIS — I5030 Unspecified diastolic (congestive) heart failure: Secondary | ICD-10-CM | POA: Diagnosis not present

## 2022-10-14 DIAGNOSIS — Z9181 History of falling: Secondary | ICD-10-CM | POA: Diagnosis not present

## 2022-10-14 DIAGNOSIS — I69354 Hemiplegia and hemiparesis following cerebral infarction affecting left non-dominant side: Secondary | ICD-10-CM | POA: Diagnosis not present

## 2022-10-14 DIAGNOSIS — S91002A Unspecified open wound, left ankle, initial encounter: Secondary | ICD-10-CM | POA: Diagnosis not present

## 2022-10-14 DIAGNOSIS — J45909 Unspecified asthma, uncomplicated: Secondary | ICD-10-CM | POA: Diagnosis not present

## 2022-10-17 ENCOUNTER — Telehealth: Payer: Self-pay

## 2022-10-17 ENCOUNTER — Other Ambulatory Visit: Payer: 59

## 2022-10-17 DIAGNOSIS — M6281 Muscle weakness (generalized): Secondary | ICD-10-CM | POA: Diagnosis not present

## 2022-10-17 DIAGNOSIS — L89626 Pressure-induced deep tissue damage of left heel: Secondary | ICD-10-CM | POA: Diagnosis not present

## 2022-10-17 DIAGNOSIS — I5032 Chronic diastolic (congestive) heart failure: Secondary | ICD-10-CM | POA: Diagnosis not present

## 2022-10-17 DIAGNOSIS — S91312A Laceration without foreign body, left foot, initial encounter: Secondary | ICD-10-CM | POA: Diagnosis not present

## 2022-10-17 DIAGNOSIS — J449 Chronic obstructive pulmonary disease, unspecified: Secondary | ICD-10-CM | POA: Diagnosis not present

## 2022-10-17 NOTE — Patient Outreach (Signed)
Care Management/Care Coordination  RN Case Manager Case Closure Note  10/17/2022 Name: Beth [name] MRN: [id number] DOB: 1945/06/01  Beth [name] is a 77 y.o. year old female who is a primary care patient of Smitty Cords, DO. The care management/care coordination team was consulted for assistance with chronic disease management and/or care coordination needs.   There are no care plans that you recently modified to display for this patient.   Plan: All appropriate care management assessments have been performed and a patient centered care plan has been developed in collaboration with the patient subsequent to referral for care management services. The patient has been provided with exhaustive education and care management resources but has not met established and adjusted patient centered goals. The care management team has no additional activities or interventions to offer at this time.  Should new needs arise, the care management team is available to collaborate with the provider, care team and patient. The patient has been provided contact information for the care management team.   The patient is now a resident of a long term care facility and her needs are being met in that setting. No further care management needs are needed from the care management program. Goals have been completed and the plan of care has been closed.   Alto Denver RN, MSN, CCM RN Care Manager  [facility]  Ambulatory Care Management  Direct Number: [phone]

## 2022-10-19 DIAGNOSIS — I10 Essential (primary) hypertension: Secondary | ICD-10-CM | POA: Diagnosis not present

## 2022-10-23 NOTE — Progress Notes (Unsigned)
No show

## 2022-10-24 DIAGNOSIS — S91312A Laceration without foreign body, left foot, initial encounter: Secondary | ICD-10-CM | POA: Diagnosis not present

## 2022-10-28 ENCOUNTER — Telehealth: Payer: 59

## 2022-10-28 DIAGNOSIS — J441 Chronic obstructive pulmonary disease with (acute) exacerbation: Secondary | ICD-10-CM | POA: Diagnosis not present

## 2022-10-29 ENCOUNTER — Ambulatory Visit (INDEPENDENT_AMBULATORY_CARE_PROVIDER_SITE_OTHER): Payer: 59 | Admitting: Psychiatry

## 2022-10-29 DIAGNOSIS — I10 Essential (primary) hypertension: Secondary | ICD-10-CM | POA: Diagnosis not present

## 2022-10-29 DIAGNOSIS — Z91199 Patient's noncompliance with other medical treatment and regimen due to unspecified reason: Secondary | ICD-10-CM

## 2022-10-30 ENCOUNTER — Telehealth: Payer: Self-pay | Admitting: Family Medicine

## 2022-10-31 DIAGNOSIS — S91312A Laceration without foreign body, left foot, initial encounter: Secondary | ICD-10-CM | POA: Diagnosis not present

## 2022-11-06 DIAGNOSIS — I10 Essential (primary) hypertension: Secondary | ICD-10-CM | POA: Diagnosis not present

## 2022-11-06 DIAGNOSIS — I480 Paroxysmal atrial fibrillation: Secondary | ICD-10-CM | POA: Diagnosis not present

## 2022-11-06 DIAGNOSIS — Z7901 Long term (current) use of anticoagulants: Secondary | ICD-10-CM | POA: Diagnosis not present

## 2022-11-06 DIAGNOSIS — I69354 Hemiplegia and hemiparesis following cerebral infarction affecting left non-dominant side: Secondary | ICD-10-CM | POA: Diagnosis not present

## 2022-11-06 DIAGNOSIS — J45909 Unspecified asthma, uncomplicated: Secondary | ICD-10-CM | POA: Diagnosis not present

## 2022-11-06 DIAGNOSIS — J4489 Other specified chronic obstructive pulmonary disease: Secondary | ICD-10-CM | POA: Diagnosis not present

## 2022-11-06 DIAGNOSIS — E039 Hypothyroidism, unspecified: Secondary | ICD-10-CM | POA: Diagnosis not present

## 2022-11-06 DIAGNOSIS — J9611 Chronic respiratory failure with hypoxia: Secondary | ICD-10-CM | POA: Diagnosis not present

## 2022-11-06 DIAGNOSIS — Z8616 Personal history of COVID-19: Secondary | ICD-10-CM | POA: Diagnosis not present

## 2022-11-07 DIAGNOSIS — H6122 Impacted cerumen, left ear: Secondary | ICD-10-CM | POA: Diagnosis not present

## 2022-11-07 DIAGNOSIS — S91312A Laceration without foreign body, left foot, initial encounter: Secondary | ICD-10-CM | POA: Diagnosis not present

## 2022-11-07 DIAGNOSIS — G8929 Other chronic pain: Secondary | ICD-10-CM | POA: Diagnosis not present

## 2022-11-07 DIAGNOSIS — G629 Polyneuropathy, unspecified: Secondary | ICD-10-CM | POA: Diagnosis not present

## 2022-11-11 DIAGNOSIS — I10 Essential (primary) hypertension: Secondary | ICD-10-CM | POA: Diagnosis not present

## 2022-11-11 DIAGNOSIS — R770 Abnormality of albumin: Secondary | ICD-10-CM | POA: Diagnosis not present

## 2022-11-11 DIAGNOSIS — D649 Anemia, unspecified: Secondary | ICD-10-CM | POA: Diagnosis not present

## 2022-11-11 DIAGNOSIS — I4891 Unspecified atrial fibrillation: Secondary | ICD-10-CM | POA: Diagnosis not present

## 2022-11-12 DIAGNOSIS — G4700 Insomnia, unspecified: Secondary | ICD-10-CM | POA: Diagnosis not present

## 2022-11-12 DIAGNOSIS — S91002A Unspecified open wound, left ankle, initial encounter: Secondary | ICD-10-CM | POA: Diagnosis not present

## 2022-11-12 DIAGNOSIS — E46 Unspecified protein-calorie malnutrition: Secondary | ICD-10-CM | POA: Diagnosis not present

## 2022-11-12 DIAGNOSIS — J45909 Unspecified asthma, uncomplicated: Secondary | ICD-10-CM | POA: Diagnosis not present

## 2022-11-12 DIAGNOSIS — G629 Polyneuropathy, unspecified: Secondary | ICD-10-CM | POA: Diagnosis not present

## 2022-11-12 DIAGNOSIS — E039 Hypothyroidism, unspecified: Secondary | ICD-10-CM | POA: Diagnosis not present

## 2022-11-12 DIAGNOSIS — I110 Hypertensive heart disease with heart failure: Secondary | ICD-10-CM | POA: Diagnosis not present

## 2022-11-12 DIAGNOSIS — Z6820 Body mass index (BMI) 20.0-20.9, adult: Secondary | ICD-10-CM | POA: Diagnosis not present

## 2022-11-12 DIAGNOSIS — G8929 Other chronic pain: Secondary | ICD-10-CM | POA: Diagnosis not present

## 2022-11-12 DIAGNOSIS — I69354 Hemiplegia and hemiparesis following cerebral infarction affecting left non-dominant side: Secondary | ICD-10-CM | POA: Diagnosis not present

## 2022-11-12 DIAGNOSIS — J4489 Other specified chronic obstructive pulmonary disease: Secondary | ICD-10-CM | POA: Diagnosis not present

## 2022-11-12 DIAGNOSIS — I5030 Unspecified diastolic (congestive) heart failure: Secondary | ICD-10-CM | POA: Diagnosis not present

## 2022-11-12 DIAGNOSIS — I480 Paroxysmal atrial fibrillation: Secondary | ICD-10-CM | POA: Diagnosis not present

## 2022-11-12 DIAGNOSIS — Z7901 Long term (current) use of anticoagulants: Secondary | ICD-10-CM | POA: Diagnosis not present

## 2022-11-12 DIAGNOSIS — K589 Irritable bowel syndrome without diarrhea: Secondary | ICD-10-CM | POA: Diagnosis not present

## 2022-11-12 DIAGNOSIS — M797 Fibromyalgia: Secondary | ICD-10-CM | POA: Diagnosis not present

## 2022-11-12 DIAGNOSIS — J9611 Chronic respiratory failure with hypoxia: Secondary | ICD-10-CM | POA: Diagnosis not present

## 2022-11-14 DIAGNOSIS — S91312A Laceration without foreign body, left foot, initial encounter: Secondary | ICD-10-CM | POA: Diagnosis not present

## 2022-11-18 DIAGNOSIS — S91312D Laceration without foreign body, left foot, subsequent encounter: Secondary | ICD-10-CM | POA: Diagnosis not present

## 2022-11-21 DIAGNOSIS — M6281 Muscle weakness (generalized): Secondary | ICD-10-CM | POA: Diagnosis not present

## 2022-11-21 DIAGNOSIS — S91312A Laceration without foreign body, left foot, initial encounter: Secondary | ICD-10-CM | POA: Diagnosis not present

## 2022-11-21 DIAGNOSIS — I5032 Chronic diastolic (congestive) heart failure: Secondary | ICD-10-CM | POA: Diagnosis not present

## 2022-11-21 DIAGNOSIS — J449 Chronic obstructive pulmonary disease, unspecified: Secondary | ICD-10-CM | POA: Diagnosis not present

## 2022-11-25 ENCOUNTER — Telehealth: Payer: Self-pay | Admitting: Family Medicine

## 2022-11-25 NOTE — Telephone Encounter (Signed)
Called 11/25/2022 to sched Annual Wellness Visit - MAILBOX FULL  Beth [name]; Care Guide Ambulatory Clinical Support [place] [facility] Direct Dial: [phone]

## 2022-11-28 DIAGNOSIS — S91312A Laceration without foreign body, left foot, initial encounter: Secondary | ICD-10-CM | POA: Diagnosis not present

## 2022-11-28 DIAGNOSIS — I5032 Chronic diastolic (congestive) heart failure: Secondary | ICD-10-CM | POA: Diagnosis not present

## 2022-11-28 DIAGNOSIS — M6281 Muscle weakness (generalized): Secondary | ICD-10-CM | POA: Diagnosis not present

## 2022-11-28 DIAGNOSIS — J449 Chronic obstructive pulmonary disease, unspecified: Secondary | ICD-10-CM | POA: Diagnosis not present

## 2022-12-05 DIAGNOSIS — I5032 Chronic diastolic (congestive) heart failure: Secondary | ICD-10-CM | POA: Diagnosis not present

## 2022-12-05 DIAGNOSIS — M6281 Muscle weakness (generalized): Secondary | ICD-10-CM | POA: Diagnosis not present

## 2022-12-05 DIAGNOSIS — S91312A Laceration without foreign body, left foot, initial encounter: Secondary | ICD-10-CM | POA: Diagnosis not present

## 2022-12-05 DIAGNOSIS — J449 Chronic obstructive pulmonary disease, unspecified: Secondary | ICD-10-CM | POA: Diagnosis not present

## 2022-12-14 DIAGNOSIS — N390 Urinary tract infection, site not specified: Secondary | ICD-10-CM | POA: Diagnosis not present

## 2022-12-16 DIAGNOSIS — N6325 Unspecified lump in the left breast, overlapping quadrants: Secondary | ICD-10-CM | POA: Diagnosis not present

## 2022-12-16 DIAGNOSIS — G8929 Other chronic pain: Secondary | ICD-10-CM | POA: Diagnosis not present

## 2022-12-16 DIAGNOSIS — Z8739 Personal history of other diseases of the musculoskeletal system and connective tissue: Secondary | ICD-10-CM | POA: Diagnosis not present

## 2022-12-16 DIAGNOSIS — M545 Low back pain, unspecified: Secondary | ICD-10-CM | POA: Diagnosis not present

## 2022-12-16 DIAGNOSIS — R1311 Dysphagia, oral phase: Secondary | ICD-10-CM | POA: Diagnosis not present

## 2022-12-17 DIAGNOSIS — R1311 Dysphagia, oral phase: Secondary | ICD-10-CM | POA: Diagnosis not present

## 2022-12-17 DIAGNOSIS — N390 Urinary tract infection, site not specified: Secondary | ICD-10-CM | POA: Diagnosis not present

## 2022-12-17 DIAGNOSIS — M545 Low back pain, unspecified: Secondary | ICD-10-CM | POA: Diagnosis not present

## 2022-12-18 DIAGNOSIS — R1311 Dysphagia, oral phase: Secondary | ICD-10-CM | POA: Diagnosis not present

## 2022-12-19 DIAGNOSIS — J449 Chronic obstructive pulmonary disease, unspecified: Secondary | ICD-10-CM | POA: Diagnosis not present

## 2022-12-19 DIAGNOSIS — I5032 Chronic diastolic (congestive) heart failure: Secondary | ICD-10-CM | POA: Diagnosis not present

## 2022-12-19 DIAGNOSIS — M6281 Muscle weakness (generalized): Secondary | ICD-10-CM | POA: Diagnosis not present

## 2022-12-19 DIAGNOSIS — S91312D Laceration without foreign body, left foot, subsequent encounter: Secondary | ICD-10-CM | POA: Diagnosis not present

## 2022-12-19 DIAGNOSIS — R1311 Dysphagia, oral phase: Secondary | ICD-10-CM | POA: Diagnosis not present

## 2022-12-20 DIAGNOSIS — R1311 Dysphagia, oral phase: Secondary | ICD-10-CM | POA: Diagnosis not present

## 2023-01-03 DIAGNOSIS — R21 Rash and other nonspecific skin eruption: Secondary | ICD-10-CM | POA: Diagnosis not present

## 2023-01-07 DIAGNOSIS — L299 Pruritus, unspecified: Secondary | ICD-10-CM | POA: Diagnosis not present

## 2023-01-14 ENCOUNTER — Other Ambulatory Visit: Payer: Self-pay | Admitting: Family Medicine

## 2023-01-14 DIAGNOSIS — N6325 Unspecified lump in the left breast, overlapping quadrants: Secondary | ICD-10-CM

## 2023-01-17 DIAGNOSIS — B369 Superficial mycosis, unspecified: Secondary | ICD-10-CM | POA: Diagnosis not present

## 2023-01-17 DIAGNOSIS — L299 Pruritus, unspecified: Secondary | ICD-10-CM | POA: Diagnosis not present

## 2023-02-03 DIAGNOSIS — G8929 Other chronic pain: Secondary | ICD-10-CM | POA: Diagnosis not present

## 2023-02-03 DIAGNOSIS — R600 Localized edema: Secondary | ICD-10-CM | POA: Diagnosis not present

## 2023-02-03 DIAGNOSIS — J4489 Other specified chronic obstructive pulmonary disease: Secondary | ICD-10-CM | POA: Diagnosis not present

## 2023-02-14 ENCOUNTER — Ambulatory Visit
Admission: RE | Admit: 2023-02-14 | Discharge: 2023-02-14 | Disposition: A | Discharge status: Home / Self Care | Payer: 59 | Source: Ambulatory Visit | Attending: Family Medicine | Admitting: Family Medicine

## 2023-02-14 DIAGNOSIS — N6325 Unspecified lump in the left breast, overlapping quadrants: Secondary | ICD-10-CM | POA: Insufficient documentation

## 2023-02-14 DIAGNOSIS — R928 Other abnormal and inconclusive findings on diagnostic imaging of breast: Secondary | ICD-10-CM | POA: Diagnosis not present

## 2023-02-14 DIAGNOSIS — N6320 Unspecified lump in the left breast, unspecified quadrant: Secondary | ICD-10-CM | POA: Diagnosis not present

## 2023-02-14 DIAGNOSIS — R92333 Mammographic heterogeneous density, bilateral breasts: Secondary | ICD-10-CM | POA: Diagnosis not present

## 2023-02-28 DIAGNOSIS — E038 Other specified hypothyroidism: Secondary | ICD-10-CM | POA: Diagnosis not present

## 2023-02-28 DIAGNOSIS — E876 Hypokalemia: Secondary | ICD-10-CM | POA: Diagnosis not present

## 2023-02-28 DIAGNOSIS — E039 Hypothyroidism, unspecified: Secondary | ICD-10-CM | POA: Diagnosis not present

## 2023-02-28 DIAGNOSIS — I5032 Chronic diastolic (congestive) heart failure: Secondary | ICD-10-CM | POA: Diagnosis not present

## 2023-02-28 DIAGNOSIS — I110 Hypertensive heart disease with heart failure: Secondary | ICD-10-CM | POA: Diagnosis not present

## 2023-02-28 DIAGNOSIS — N6325 Unspecified lump in the left breast, overlapping quadrants: Secondary | ICD-10-CM | POA: Diagnosis not present

## 2023-03-03 DIAGNOSIS — D649 Anemia, unspecified: Secondary | ICD-10-CM | POA: Diagnosis not present

## 2023-03-03 DIAGNOSIS — Z79899 Other long term (current) drug therapy: Secondary | ICD-10-CM | POA: Diagnosis not present

## 2023-03-03 DIAGNOSIS — I10 Essential (primary) hypertension: Secondary | ICD-10-CM | POA: Diagnosis not present

## 2023-03-03 DIAGNOSIS — I4891 Unspecified atrial fibrillation: Secondary | ICD-10-CM | POA: Diagnosis not present

## 2023-03-03 DIAGNOSIS — R770 Abnormality of albumin: Secondary | ICD-10-CM | POA: Diagnosis not present

## 2023-03-05 DIAGNOSIS — B86 Scabies: Secondary | ICD-10-CM | POA: Diagnosis not present

## 2023-03-05 DIAGNOSIS — L281 Prurigo nodularis: Secondary | ICD-10-CM | POA: Diagnosis not present

## 2023-03-14 DIAGNOSIS — N6325 Unspecified lump in the left breast, overlapping quadrants: Secondary | ICD-10-CM | POA: Diagnosis not present

## 2023-03-17 DIAGNOSIS — Z741 Need for assistance with personal care: Secondary | ICD-10-CM | POA: Diagnosis not present

## 2023-03-18 DIAGNOSIS — Z741 Need for assistance with personal care: Secondary | ICD-10-CM | POA: Diagnosis not present

## 2023-03-19 DIAGNOSIS — Z741 Need for assistance with personal care: Secondary | ICD-10-CM | POA: Diagnosis not present

## 2023-03-20 DIAGNOSIS — Z741 Need for assistance with personal care: Secondary | ICD-10-CM | POA: Diagnosis not present

## 2023-03-21 DIAGNOSIS — R600 Localized edema: Secondary | ICD-10-CM | POA: Diagnosis not present

## 2023-03-21 DIAGNOSIS — Z741 Need for assistance with personal care: Secondary | ICD-10-CM | POA: Diagnosis not present

## 2023-03-21 DIAGNOSIS — J4489 Other specified chronic obstructive pulmonary disease: Secondary | ICD-10-CM | POA: Diagnosis not present

## 2023-03-21 DIAGNOSIS — S8262XD Displaced fracture of lateral malleolus of left fibula, subsequent encounter for closed fracture with routine healing: Secondary | ICD-10-CM | POA: Diagnosis not present

## 2023-03-22 DIAGNOSIS — Z741 Need for assistance with personal care: Secondary | ICD-10-CM | POA: Diagnosis not present

## 2023-03-24 DIAGNOSIS — Z741 Need for assistance with personal care: Secondary | ICD-10-CM | POA: Diagnosis not present

## 2023-03-25 DIAGNOSIS — Z741 Need for assistance with personal care: Secondary | ICD-10-CM | POA: Diagnosis not present

## 2023-03-26 DIAGNOSIS — Z741 Need for assistance with personal care: Secondary | ICD-10-CM | POA: Diagnosis not present

## 2023-03-27 DIAGNOSIS — Z741 Need for assistance with personal care: Secondary | ICD-10-CM | POA: Diagnosis not present

## 2023-03-28 DIAGNOSIS — K529 Noninfective gastroenteritis and colitis, unspecified: Secondary | ICD-10-CM | POA: Diagnosis not present

## 2023-03-28 DIAGNOSIS — J4489 Other specified chronic obstructive pulmonary disease: Secondary | ICD-10-CM | POA: Diagnosis not present

## 2023-03-28 DIAGNOSIS — J45909 Unspecified asthma, uncomplicated: Secondary | ICD-10-CM | POA: Diagnosis not present

## 2023-03-31 DIAGNOSIS — Z741 Need for assistance with personal care: Secondary | ICD-10-CM | POA: Diagnosis not present

## 2023-04-01 DIAGNOSIS — Z741 Need for assistance with personal care: Secondary | ICD-10-CM | POA: Diagnosis not present

## 2023-04-02 DIAGNOSIS — Z741 Need for assistance with personal care: Secondary | ICD-10-CM | POA: Diagnosis not present

## 2023-04-03 DIAGNOSIS — Z741 Need for assistance with personal care: Secondary | ICD-10-CM | POA: Diagnosis not present

## 2023-04-04 DIAGNOSIS — Z741 Need for assistance with personal care: Secondary | ICD-10-CM | POA: Diagnosis not present

## 2023-04-07 DIAGNOSIS — Z8719 Personal history of other diseases of the digestive system: Secondary | ICD-10-CM | POA: Diagnosis not present

## 2023-04-07 DIAGNOSIS — S8262XD Displaced fracture of lateral malleolus of left fibula, subsequent encounter for closed fracture with routine healing: Secondary | ICD-10-CM | POA: Diagnosis not present

## 2023-04-07 DIAGNOSIS — Z741 Need for assistance with personal care: Secondary | ICD-10-CM | POA: Diagnosis not present

## 2023-04-10 DIAGNOSIS — Z741 Need for assistance with personal care: Secondary | ICD-10-CM | POA: Diagnosis not present

## 2023-04-11 DIAGNOSIS — J4489 Other specified chronic obstructive pulmonary disease: Secondary | ICD-10-CM | POA: Diagnosis not present

## 2023-04-11 DIAGNOSIS — J45909 Unspecified asthma, uncomplicated: Secondary | ICD-10-CM | POA: Diagnosis not present

## 2023-04-12 DIAGNOSIS — R059 Cough, unspecified: Secondary | ICD-10-CM | POA: Diagnosis not present

## 2023-04-14 DIAGNOSIS — J45909 Unspecified asthma, uncomplicated: Secondary | ICD-10-CM | POA: Diagnosis not present

## 2023-04-14 DIAGNOSIS — Z8719 Personal history of other diseases of the digestive system: Secondary | ICD-10-CM | POA: Diagnosis not present

## 2023-04-14 DIAGNOSIS — J4489 Other specified chronic obstructive pulmonary disease: Secondary | ICD-10-CM | POA: Diagnosis not present

## 2023-04-15 DIAGNOSIS — R509 Fever, unspecified: Secondary | ICD-10-CM | POA: Diagnosis not present

## 2023-04-21 DIAGNOSIS — J45909 Unspecified asthma, uncomplicated: Secondary | ICD-10-CM | POA: Diagnosis not present

## 2023-04-21 DIAGNOSIS — J4489 Other specified chronic obstructive pulmonary disease: Secondary | ICD-10-CM | POA: Diagnosis not present

## 2023-04-24 ENCOUNTER — Encounter: Payer: Self-pay | Admitting: Emergency Medicine

## 2023-04-24 ENCOUNTER — Other Ambulatory Visit: Payer: Self-pay

## 2023-04-24 DIAGNOSIS — Z7189 Other specified counseling: Secondary | ICD-10-CM | POA: Diagnosis not present

## 2023-04-24 DIAGNOSIS — R918 Other nonspecific abnormal finding of lung field: Secondary | ICD-10-CM | POA: Diagnosis not present

## 2023-04-24 DIAGNOSIS — J9622 Acute and chronic respiratory failure with hypercapnia: Secondary | ICD-10-CM | POA: Diagnosis not present

## 2023-04-24 DIAGNOSIS — R0603 Acute respiratory distress: Secondary | ICD-10-CM | POA: Diagnosis not present

## 2023-04-24 DIAGNOSIS — E872 Acidosis, unspecified: Secondary | ICD-10-CM | POA: Diagnosis not present

## 2023-04-24 DIAGNOSIS — J439 Emphysema, unspecified: Secondary | ICD-10-CM | POA: Diagnosis present

## 2023-04-24 DIAGNOSIS — I2489 Other forms of acute ischemic heart disease: Secondary | ICD-10-CM | POA: Diagnosis not present

## 2023-04-24 DIAGNOSIS — E038 Other specified hypothyroidism: Secondary | ICD-10-CM | POA: Diagnosis present

## 2023-04-24 DIAGNOSIS — J810 Acute pulmonary edema: Secondary | ICD-10-CM | POA: Diagnosis not present

## 2023-04-24 DIAGNOSIS — R6520 Severe sepsis without septic shock: Secondary | ICD-10-CM | POA: Diagnosis not present

## 2023-04-24 DIAGNOSIS — J9611 Chronic respiratory failure with hypoxia: Secondary | ICD-10-CM | POA: Diagnosis present

## 2023-04-24 DIAGNOSIS — Z8673 Personal history of transient ischemic attack (TIA), and cerebral infarction without residual deficits: Secondary | ICD-10-CM | POA: Diagnosis not present

## 2023-04-24 DIAGNOSIS — U071 COVID-19: Secondary | ICD-10-CM | POA: Diagnosis not present

## 2023-04-24 DIAGNOSIS — I480 Paroxysmal atrial fibrillation: Secondary | ICD-10-CM | POA: Diagnosis not present

## 2023-04-24 DIAGNOSIS — A419 Sepsis, unspecified organism: Secondary | ICD-10-CM | POA: Diagnosis not present

## 2023-04-24 DIAGNOSIS — Z8701 Personal history of pneumonia (recurrent): Secondary | ICD-10-CM

## 2023-04-24 DIAGNOSIS — I5032 Chronic diastolic (congestive) heart failure: Secondary | ICD-10-CM | POA: Diagnosis not present

## 2023-04-24 DIAGNOSIS — D649 Anemia, unspecified: Secondary | ICD-10-CM | POA: Diagnosis present

## 2023-04-24 DIAGNOSIS — Z1152 Encounter for screening for COVID-19: Secondary | ICD-10-CM | POA: Diagnosis not present

## 2023-04-24 DIAGNOSIS — E559 Vitamin D deficiency, unspecified: Secondary | ICD-10-CM | POA: Diagnosis present

## 2023-04-24 DIAGNOSIS — Z803 Family history of malignant neoplasm of breast: Secondary | ICD-10-CM

## 2023-04-24 DIAGNOSIS — E43 Unspecified severe protein-calorie malnutrition: Secondary | ICD-10-CM | POA: Diagnosis present

## 2023-04-24 DIAGNOSIS — I4729 Other ventricular tachycardia: Secondary | ICD-10-CM | POA: Diagnosis not present

## 2023-04-24 DIAGNOSIS — Z9151 Personal history of suicidal behavior: Secondary | ICD-10-CM

## 2023-04-24 DIAGNOSIS — E876 Hypokalemia: Secondary | ICD-10-CM | POA: Diagnosis not present

## 2023-04-24 DIAGNOSIS — E860 Dehydration: Secondary | ICD-10-CM | POA: Diagnosis present

## 2023-04-24 DIAGNOSIS — J9621 Acute and chronic respiratory failure with hypoxia: Secondary | ICD-10-CM | POA: Diagnosis not present

## 2023-04-24 DIAGNOSIS — G4700 Insomnia, unspecified: Secondary | ICD-10-CM | POA: Diagnosis present

## 2023-04-24 DIAGNOSIS — J441 Chronic obstructive pulmonary disease with (acute) exacerbation: Secondary | ICD-10-CM | POA: Diagnosis not present

## 2023-04-24 DIAGNOSIS — B9729 Other coronavirus as the cause of diseases classified elsewhere: Secondary | ICD-10-CM | POA: Diagnosis not present

## 2023-04-24 DIAGNOSIS — F333 Major depressive disorder, recurrent, severe with psychotic symptoms: Secondary | ICD-10-CM | POA: Diagnosis present

## 2023-04-24 DIAGNOSIS — I5033 Acute on chronic diastolic (congestive) heart failure: Secondary | ICD-10-CM | POA: Diagnosis not present

## 2023-04-24 DIAGNOSIS — I4581 Long QT syndrome: Secondary | ICD-10-CM | POA: Diagnosis not present

## 2023-04-24 DIAGNOSIS — I69354 Hemiplegia and hemiparesis following cerebral infarction affecting left non-dominant side: Secondary | ICD-10-CM

## 2023-04-24 DIAGNOSIS — Z90710 Acquired absence of both cervix and uterus: Secondary | ICD-10-CM

## 2023-04-24 DIAGNOSIS — Z7951 Long term (current) use of inhaled steroids: Secondary | ICD-10-CM

## 2023-04-24 DIAGNOSIS — Z79890 Hormone replacement therapy (postmenopausal): Secondary | ICD-10-CM

## 2023-04-24 DIAGNOSIS — F32A Depression, unspecified: Secondary | ICD-10-CM | POA: Diagnosis not present

## 2023-04-24 DIAGNOSIS — I110 Hypertensive heart disease with heart failure: Secondary | ICD-10-CM | POA: Diagnosis present

## 2023-04-24 DIAGNOSIS — R9431 Abnormal electrocardiogram [ECG] [EKG]: Secondary | ICD-10-CM | POA: Diagnosis not present

## 2023-04-24 DIAGNOSIS — E785 Hyperlipidemia, unspecified: Secondary | ICD-10-CM | POA: Diagnosis present

## 2023-04-24 DIAGNOSIS — Z66 Do not resuscitate: Secondary | ICD-10-CM | POA: Diagnosis present

## 2023-04-24 DIAGNOSIS — J440 Chronic obstructive pulmonary disease with acute lower respiratory infection: Secondary | ICD-10-CM | POA: Diagnosis not present

## 2023-04-24 DIAGNOSIS — R072 Precordial pain: Secondary | ICD-10-CM | POA: Diagnosis not present

## 2023-04-24 DIAGNOSIS — Z5982 Transportation insecurity: Secondary | ICD-10-CM

## 2023-04-24 DIAGNOSIS — J189 Pneumonia, unspecified organism: Secondary | ICD-10-CM | POA: Diagnosis not present

## 2023-04-24 DIAGNOSIS — Z9981 Dependence on supplemental oxygen: Secondary | ICD-10-CM

## 2023-04-24 DIAGNOSIS — J9690 Respiratory failure, unspecified, unspecified whether with hypoxia or hypercapnia: Secondary | ICD-10-CM | POA: Diagnosis not present

## 2023-04-24 DIAGNOSIS — G629 Polyneuropathy, unspecified: Secondary | ICD-10-CM | POA: Diagnosis present

## 2023-04-24 DIAGNOSIS — R7989 Other specified abnormal findings of blood chemistry: Secondary | ICD-10-CM | POA: Diagnosis not present

## 2023-04-24 DIAGNOSIS — F419 Anxiety disorder, unspecified: Secondary | ICD-10-CM | POA: Diagnosis present

## 2023-04-24 DIAGNOSIS — F332 Major depressive disorder, recurrent severe without psychotic features: Secondary | ICD-10-CM | POA: Diagnosis not present

## 2023-04-24 DIAGNOSIS — I272 Pulmonary hypertension, unspecified: Secondary | ICD-10-CM | POA: Diagnosis present

## 2023-04-24 DIAGNOSIS — Z818 Family history of other mental and behavioral disorders: Secondary | ICD-10-CM

## 2023-04-24 DIAGNOSIS — Z515 Encounter for palliative care: Secondary | ICD-10-CM

## 2023-04-24 DIAGNOSIS — F331 Major depressive disorder, recurrent, moderate: Secondary | ICD-10-CM | POA: Diagnosis not present

## 2023-04-24 DIAGNOSIS — Z4682 Encounter for fitting and adjustment of non-vascular catheter: Secondary | ICD-10-CM | POA: Diagnosis not present

## 2023-04-24 DIAGNOSIS — R627 Adult failure to thrive: Secondary | ICD-10-CM | POA: Diagnosis present

## 2023-04-24 DIAGNOSIS — Z981 Arthrodesis status: Secondary | ICD-10-CM | POA: Diagnosis not present

## 2023-04-24 DIAGNOSIS — Z888 Allergy status to other drugs, medicaments and biological substances status: Secondary | ICD-10-CM

## 2023-04-24 DIAGNOSIS — R739 Hyperglycemia, unspecified: Secondary | ICD-10-CM | POA: Diagnosis not present

## 2023-04-24 DIAGNOSIS — F17210 Nicotine dependence, cigarettes, uncomplicated: Secondary | ICD-10-CM | POA: Diagnosis present

## 2023-04-24 DIAGNOSIS — Z9049 Acquired absence of other specified parts of digestive tract: Secondary | ICD-10-CM

## 2023-04-24 DIAGNOSIS — J4489 Other specified chronic obstructive pulmonary disease: Secondary | ICD-10-CM | POA: Diagnosis present

## 2023-04-24 DIAGNOSIS — R7889 Finding of other specified substances, not normally found in blood: Secondary | ICD-10-CM | POA: Diagnosis not present

## 2023-04-24 DIAGNOSIS — F411 Generalized anxiety disorder: Secondary | ICD-10-CM | POA: Diagnosis not present

## 2023-04-24 DIAGNOSIS — R001 Bradycardia, unspecified: Secondary | ICD-10-CM | POA: Diagnosis present

## 2023-04-24 DIAGNOSIS — I700 Atherosclerosis of aorta: Secondary | ICD-10-CM | POA: Diagnosis not present

## 2023-04-24 DIAGNOSIS — R54 Age-related physical debility: Secondary | ICD-10-CM | POA: Diagnosis present

## 2023-04-24 DIAGNOSIS — N390 Urinary tract infection, site not specified: Secondary | ICD-10-CM | POA: Diagnosis present

## 2023-04-24 DIAGNOSIS — Z7901 Long term (current) use of anticoagulants: Secondary | ICD-10-CM

## 2023-04-24 DIAGNOSIS — R6521 Severe sepsis with septic shock: Secondary | ICD-10-CM | POA: Diagnosis not present

## 2023-04-24 DIAGNOSIS — Z711 Person with feared health complaint in whom no diagnosis is made: Secondary | ICD-10-CM | POA: Diagnosis not present

## 2023-04-24 DIAGNOSIS — G894 Chronic pain syndrome: Secondary | ICD-10-CM | POA: Diagnosis present

## 2023-04-24 DIAGNOSIS — N3000 Acute cystitis without hematuria: Secondary | ICD-10-CM | POA: Diagnosis not present

## 2023-04-24 DIAGNOSIS — F329 Major depressive disorder, single episode, unspecified: Secondary | ICD-10-CM | POA: Diagnosis not present

## 2023-04-24 DIAGNOSIS — R197 Diarrhea, unspecified: Secondary | ICD-10-CM | POA: Diagnosis not present

## 2023-04-24 DIAGNOSIS — I482 Chronic atrial fibrillation, unspecified: Secondary | ICD-10-CM | POA: Diagnosis not present

## 2023-04-24 DIAGNOSIS — I350 Nonrheumatic aortic (valve) stenosis: Secondary | ICD-10-CM | POA: Diagnosis present

## 2023-04-24 DIAGNOSIS — Z604 Social exclusion and rejection: Secondary | ICD-10-CM | POA: Diagnosis present

## 2023-04-24 DIAGNOSIS — I959 Hypotension, unspecified: Secondary | ICD-10-CM | POA: Diagnosis not present

## 2023-04-24 DIAGNOSIS — I4891 Unspecified atrial fibrillation: Secondary | ICD-10-CM | POA: Diagnosis not present

## 2023-04-24 DIAGNOSIS — Z91410 Personal history of adult physical and sexual abuse: Secondary | ICD-10-CM

## 2023-04-24 DIAGNOSIS — R140 Abdominal distension (gaseous): Secondary | ICD-10-CM | POA: Diagnosis not present

## 2023-04-24 DIAGNOSIS — R0602 Shortness of breath: Secondary | ICD-10-CM | POA: Diagnosis not present

## 2023-04-24 DIAGNOSIS — R300 Dysuria: Secondary | ICD-10-CM | POA: Diagnosis present

## 2023-04-24 DIAGNOSIS — B9620 Unspecified Escherichia coli [E. coli] as the cause of diseases classified elsewhere: Secondary | ICD-10-CM | POA: Diagnosis present

## 2023-04-24 DIAGNOSIS — Z79899 Other long term (current) drug therapy: Secondary | ICD-10-CM

## 2023-04-24 DIAGNOSIS — R0989 Other specified symptoms and signs involving the circulatory and respiratory systems: Secondary | ICD-10-CM | POA: Diagnosis not present

## 2023-04-24 DIAGNOSIS — M797 Fibromyalgia: Secondary | ICD-10-CM | POA: Diagnosis present

## 2023-04-24 DIAGNOSIS — I071 Rheumatic tricuspid insufficiency: Secondary | ICD-10-CM | POA: Diagnosis present

## 2023-04-24 DIAGNOSIS — K580 Irritable bowel syndrome with diarrhea: Secondary | ICD-10-CM | POA: Diagnosis present

## 2023-04-24 DIAGNOSIS — Z681 Body mass index (BMI) 19 or less, adult: Secondary | ICD-10-CM

## 2023-04-24 DIAGNOSIS — R45851 Suicidal ideations: Secondary | ICD-10-CM | POA: Diagnosis not present

## 2023-04-24 DIAGNOSIS — Z7401 Bed confinement status: Secondary | ICD-10-CM | POA: Diagnosis not present

## 2023-04-24 DIAGNOSIS — J9601 Acute respiratory failure with hypoxia: Secondary | ICD-10-CM | POA: Diagnosis not present

## 2023-04-24 DIAGNOSIS — I471 Supraventricular tachycardia, unspecified: Secondary | ICD-10-CM | POA: Diagnosis not present

## 2023-04-24 DIAGNOSIS — J9600 Acute respiratory failure, unspecified whether with hypoxia or hypercapnia: Secondary | ICD-10-CM | POA: Diagnosis not present

## 2023-04-24 DIAGNOSIS — R251 Tremor, unspecified: Secondary | ICD-10-CM | POA: Diagnosis present

## 2023-04-24 NOTE — ED Notes (Signed)
 With this nurse and EDT Ariel present, pt removes blue pants, pink t shirt, tan cardigan, blue tennis shoes, grey socks--all placed in pt belonging bag to be secured on nursing floor and pt changed into paper scrubs

## 2023-04-24 NOTE — ED Triage Notes (Signed)
 Pt to triage via w/c with no distress noted; pt brought in by son who reports that she resides at [facility]; st EMS was called out because she tried to stab herself with scissors; no injuries occurred but pt admits to feelings of depression and thoughts of hurting herself

## 2023-04-25 ENCOUNTER — Inpatient Hospital Stay
Admission: EM | Admit: 2023-04-25 | Discharge: 2023-05-05 | DRG: 640 | Disposition: A | Discharge status: Hospice | Source: Skilled Nursing Facility | Attending: Internal Medicine | Admitting: Internal Medicine

## 2023-04-25 DIAGNOSIS — E43 Unspecified severe protein-calorie malnutrition: Secondary | ICD-10-CM | POA: Diagnosis present

## 2023-04-25 DIAGNOSIS — I5032 Chronic diastolic (congestive) heart failure: Secondary | ICD-10-CM

## 2023-04-25 DIAGNOSIS — A419 Sepsis, unspecified organism: Secondary | ICD-10-CM | POA: Diagnosis not present

## 2023-04-25 DIAGNOSIS — R072 Precordial pain: Secondary | ICD-10-CM

## 2023-04-25 DIAGNOSIS — F331 Major depressive disorder, recurrent, moderate: Secondary | ICD-10-CM | POA: Diagnosis not present

## 2023-04-25 DIAGNOSIS — R6521 Severe sepsis with septic shock: Secondary | ICD-10-CM | POA: Diagnosis not present

## 2023-04-25 DIAGNOSIS — D649 Anemia, unspecified: Secondary | ICD-10-CM | POA: Diagnosis present

## 2023-04-25 DIAGNOSIS — R45851 Suicidal ideations: Secondary | ICD-10-CM

## 2023-04-25 DIAGNOSIS — J189 Pneumonia, unspecified organism: Secondary | ICD-10-CM | POA: Diagnosis not present

## 2023-04-25 DIAGNOSIS — I4891 Unspecified atrial fibrillation: Secondary | ICD-10-CM | POA: Diagnosis not present

## 2023-04-25 DIAGNOSIS — J439 Emphysema, unspecified: Secondary | ICD-10-CM | POA: Diagnosis present

## 2023-04-25 DIAGNOSIS — R197 Diarrhea, unspecified: Secondary | ICD-10-CM | POA: Insufficient documentation

## 2023-04-25 DIAGNOSIS — R9431 Abnormal electrocardiogram [ECG] [EKG]: Secondary | ICD-10-CM | POA: Diagnosis not present

## 2023-04-25 DIAGNOSIS — J9621 Acute and chronic respiratory failure with hypoxia: Secondary | ICD-10-CM

## 2023-04-25 DIAGNOSIS — F329 Major depressive disorder, single episode, unspecified: Secondary | ICD-10-CM | POA: Diagnosis not present

## 2023-04-25 DIAGNOSIS — E860 Dehydration: Secondary | ICD-10-CM | POA: Diagnosis present

## 2023-04-25 DIAGNOSIS — F333 Major depressive disorder, recurrent, severe with psychotic symptoms: Secondary | ICD-10-CM | POA: Diagnosis present

## 2023-04-25 DIAGNOSIS — I2489 Other forms of acute ischemic heart disease: Secondary | ICD-10-CM | POA: Diagnosis not present

## 2023-04-25 DIAGNOSIS — R7989 Other specified abnormal findings of blood chemistry: Secondary | ICD-10-CM | POA: Diagnosis not present

## 2023-04-25 DIAGNOSIS — N390 Urinary tract infection, site not specified: Secondary | ICD-10-CM | POA: Diagnosis present

## 2023-04-25 DIAGNOSIS — R0603 Acute respiratory distress: Secondary | ICD-10-CM

## 2023-04-25 DIAGNOSIS — I4729 Other ventricular tachycardia: Secondary | ICD-10-CM | POA: Diagnosis not present

## 2023-04-25 DIAGNOSIS — J4489 Other specified chronic obstructive pulmonary disease: Secondary | ICD-10-CM

## 2023-04-25 DIAGNOSIS — F332 Major depressive disorder, recurrent severe without psychotic features: Secondary | ICD-10-CM | POA: Diagnosis not present

## 2023-04-25 DIAGNOSIS — U071 COVID-19: Secondary | ICD-10-CM | POA: Diagnosis not present

## 2023-04-25 DIAGNOSIS — I272 Pulmonary hypertension, unspecified: Secondary | ICD-10-CM | POA: Diagnosis present

## 2023-04-25 DIAGNOSIS — J9622 Acute and chronic respiratory failure with hypercapnia: Secondary | ICD-10-CM | POA: Diagnosis not present

## 2023-04-25 DIAGNOSIS — J441 Chronic obstructive pulmonary disease with (acute) exacerbation: Secondary | ICD-10-CM | POA: Diagnosis not present

## 2023-04-25 DIAGNOSIS — Z711 Person with feared health complaint in whom no diagnosis is made: Secondary | ICD-10-CM | POA: Diagnosis not present

## 2023-04-25 DIAGNOSIS — Z515 Encounter for palliative care: Secondary | ICD-10-CM

## 2023-04-25 DIAGNOSIS — F32A Depression, unspecified: Secondary | ICD-10-CM

## 2023-04-25 DIAGNOSIS — R7889 Finding of other specified substances, not normally found in blood: Secondary | ICD-10-CM | POA: Diagnosis not present

## 2023-04-25 DIAGNOSIS — E038 Other specified hypothyroidism: Secondary | ICD-10-CM

## 2023-04-25 DIAGNOSIS — F411 Generalized anxiety disorder: Secondary | ICD-10-CM | POA: Diagnosis not present

## 2023-04-25 DIAGNOSIS — R6520 Severe sepsis without septic shock: Secondary | ICD-10-CM | POA: Diagnosis not present

## 2023-04-25 DIAGNOSIS — Z7189 Other specified counseling: Secondary | ICD-10-CM | POA: Diagnosis not present

## 2023-04-25 DIAGNOSIS — I5033 Acute on chronic diastolic (congestive) heart failure: Secondary | ICD-10-CM | POA: Diagnosis not present

## 2023-04-25 DIAGNOSIS — R300 Dysuria: Secondary | ICD-10-CM | POA: Diagnosis present

## 2023-04-25 DIAGNOSIS — N3000 Acute cystitis without hematuria: Secondary | ICD-10-CM | POA: Diagnosis not present

## 2023-04-25 DIAGNOSIS — I69354 Hemiplegia and hemiparesis following cerebral infarction affecting left non-dominant side: Secondary | ICD-10-CM | POA: Diagnosis not present

## 2023-04-25 DIAGNOSIS — E876 Hypokalemia: Principal | ICD-10-CM

## 2023-04-25 DIAGNOSIS — I350 Nonrheumatic aortic (valve) stenosis: Secondary | ICD-10-CM

## 2023-04-25 DIAGNOSIS — E872 Acidosis, unspecified: Secondary | ICD-10-CM

## 2023-04-25 DIAGNOSIS — J9601 Acute respiratory failure with hypoxia: Secondary | ICD-10-CM

## 2023-04-25 DIAGNOSIS — J9611 Chronic respiratory failure with hypoxia: Secondary | ICD-10-CM | POA: Diagnosis present

## 2023-04-25 DIAGNOSIS — J810 Acute pulmonary edema: Secondary | ICD-10-CM | POA: Diagnosis not present

## 2023-04-25 DIAGNOSIS — I509 Heart failure, unspecified: Secondary | ICD-10-CM

## 2023-04-25 DIAGNOSIS — Z1152 Encounter for screening for COVID-19: Secondary | ICD-10-CM | POA: Diagnosis not present

## 2023-04-25 DIAGNOSIS — Z66 Do not resuscitate: Secondary | ICD-10-CM | POA: Diagnosis present

## 2023-04-25 DIAGNOSIS — J440 Chronic obstructive pulmonary disease with acute lower respiratory infection: Secondary | ICD-10-CM | POA: Diagnosis not present

## 2023-04-25 DIAGNOSIS — F339 Major depressive disorder, recurrent, unspecified: Secondary | ICD-10-CM | POA: Insufficient documentation

## 2023-04-25 DIAGNOSIS — I110 Hypertensive heart disease with heart failure: Secondary | ICD-10-CM | POA: Diagnosis present

## 2023-04-25 LAB — PHOSPHORUS: Phosphorus: 1.4 mg/dL — ABNORMAL LOW (ref 2.5–4.6)

## 2023-04-25 LAB — T4, FREE: Free T4: 1.66 ng/dL — ABNORMAL HIGH (ref 0.61–1.12)

## 2023-04-25 LAB — ETHANOL: Alcohol, Ethyl (B): <10 mg/dL (ref <10)

## 2023-04-25 LAB — BASIC METABOLIC PANEL WITH GFR
Anion gap: 11 (ref 5–15)
Anion gap: 9 (ref 5–15)
Anion gap: 10 (ref 5–15)
BUN: 15 mg/dL (ref 8–23)
BUN: 14 mg/dL (ref 8–23)
BUN: 12 mg/dL (ref 8–23)
CO2: 29 mmol/L (ref 22–32)
CO2: 27 mmol/L (ref 22–32)
CO2: 24 mmol/L (ref 22–32)
Calcium: 9.1 mg/dL (ref 8.9–10.3)
Calcium: 8.1 mg/dL — ABNORMAL LOW (ref 8.9–10.3)
Calcium: 8.3 mg/dL — ABNORMAL LOW (ref 8.9–10.3)
Chloride: 102 mmol/L (ref 98–111)
Chloride: 107 mmol/L (ref 98–111)
Chloride: 108 mmol/L (ref 98–111)
Creatinine, Ser: 0.9 mg/dL (ref 0.44–1.00)
Creatinine, Ser: 0.84 mg/dL (ref 0.44–1.00)
Creatinine, Ser: 0.97 mg/dL (ref 0.44–1.00)
GFR, Estimated: >60 mL/min (ref >60)
GFR, Estimated: >60 mL/min (ref >60)
GFR, Estimated: 59 mL/min — ABNORMAL LOW (ref >60)
Glucose, Bld: 93 mg/dL (ref 70–99)
Glucose, Bld: 88 mg/dL (ref 70–99)
Glucose, Bld: 144 mg/dL — ABNORMAL HIGH (ref 70–99)
Potassium: 2.2 mmol/L — CL (ref 3.5–5.1)
Potassium: 2.6 mmol/L — CL (ref 3.5–5.1)
Potassium: 4 mmol/L (ref 3.5–5.1)
Sodium: 142 mmol/L (ref 135–145)
Sodium: 143 mmol/L (ref 135–145)
Sodium: 142 mmol/L (ref 135–145)

## 2023-04-25 LAB — URINALYSIS, ROUTINE W REFLEX MICROSCOPIC
Bilirubin Urine: NEGATIVE
Glucose, UA: NEGATIVE mg/dL
Ketones, ur: NEGATIVE mg/dL
Nitrite: NEGATIVE
Protein, ur: NEGATIVE mg/dL
Specific Gravity, Urine: 1.012 (ref 1.005–1.030)
pH: 6 (ref 5.0–8.0)

## 2023-04-25 LAB — CBC
HCT: 40.3 % (ref 36.0–46.0)
Hemoglobin: 13.1 g/dL (ref 12.0–15.0)
MCH: 30.7 pg (ref 26.0–34.0)
MCHC: 32.5 g/dL (ref 30.0–36.0)
MCV: 94.4 fL (ref 80.0–100.0)
Platelets: 279 10*3/uL (ref 150–400)
RBC: 4.27 MIL/uL (ref 3.87–5.11)
RDW: 15.8 % — ABNORMAL HIGH (ref 11.5–15.5)
WBC: 9.4 10*3/uL (ref 4.0–10.5)
nRBC: 0 % (ref 0.0–0.2)

## 2023-04-25 LAB — MAGNESIUM
Magnesium: 1.6 mg/dL — ABNORMAL LOW (ref 1.7–2.4)
Magnesium: 2 mg/dL (ref 1.7–2.4)

## 2023-04-25 LAB — URINE DRUG SCREEN, QUALITATIVE (ARMC ONLY)
Amphetamines, Ur Screen: NOT DETECTED
Barbiturates, Ur Screen: NOT DETECTED
Benzodiazepine, Ur Scrn: NOT DETECTED
Cannabinoid 50 Ng, Ur ~~LOC~~: NOT DETECTED
Cocaine Metabolite,Ur ~~LOC~~: NOT DETECTED
MDMA (Ecstasy)Ur Screen: NOT DETECTED
Methadone Scn, Ur: NOT DETECTED
Opiate, Ur Screen: NOT DETECTED
Phencyclidine (PCP) Ur S: NOT DETECTED
Tricyclic, Ur Screen: NOT DETECTED

## 2023-04-25 LAB — SALICYLATE LEVEL: Salicylate Lvl: <7 mg/dL — ABNORMAL LOW (ref 7.0–30.0)

## 2023-04-25 LAB — TSH: TSH: 5.528 u[IU]/mL — ABNORMAL HIGH (ref 0.350–4.500)

## 2023-04-25 LAB — ACETAMINOPHEN LEVEL: Acetaminophen (Tylenol), Serum: 11 ug/mL (ref 10–30)

## 2023-04-25 MED ORDER — LEVOTHYROXINE SODIUM 50 MCG PO TABS
25.0000 ug | ORAL_TABLET | Freq: Every day | ORAL | Status: DC
  Administered 2023-04-25 – 2023-04-28 (×3): 25 ug via ORAL
  Filled 2023-04-25 (×3): qty 1

## 2023-04-25 MED ORDER — SODIUM CHLORIDE 0.9 % IV SOLN
1.0000 g | Freq: Once | INTRAVENOUS | Status: AC
  Administered 2023-04-25: 1 g via INTRAVENOUS
  Filled 2023-04-25: qty 10

## 2023-04-25 MED ORDER — APIXABAN 5 MG PO TABS
5.0000 mg | ORAL_TABLET | Freq: Two times a day (BID) | ORAL | Status: DC
  Administered 2023-04-25 – 2023-04-27 (×6): 5 mg via ORAL
  Filled 2023-04-25 (×6): qty 1

## 2023-04-25 MED ORDER — ALBUTEROL SULFATE (2.5 MG/3ML) 0.083% IN NEBU: 2.5000 mg | INHALATION_SOLUTION | Freq: Four times a day (QID) | RESPIRATORY_TRACT | Status: DC | PRN

## 2023-04-25 MED ORDER — POTASSIUM CHLORIDE CRYS ER 20 MEQ PO TBCR
40.0000 meq | EXTENDED_RELEASE_TABLET | Freq: Once | ORAL | Status: AC
  Administered 2023-04-25: 40 meq via ORAL
  Filled 2023-04-25: qty 2

## 2023-04-25 MED ORDER — ACETAMINOPHEN 650 MG RE SUPP: 650.0000 mg | Freq: Four times a day (QID) | RECTAL | Status: DC | PRN

## 2023-04-25 MED ORDER — MAGNESIUM SULFATE 2 GM/50ML IV SOLN
2.0000 g | Freq: Once | INTRAVENOUS | Status: AC
  Administered 2023-04-25: 2 g via INTRAVENOUS
  Filled 2023-04-25: qty 50

## 2023-04-25 MED ORDER — POTASSIUM CHLORIDE 10 MEQ/100ML IV SOLN
10.0000 meq | INTRAVENOUS | Status: AC
Start: 2023-04-25 — End: 2023-04-25
  Administered 2023-04-25 (×6): 10 meq via INTRAVENOUS
  Filled 2023-04-25 (×4): qty 100

## 2023-04-25 MED ORDER — ATORVASTATIN CALCIUM 20 MG PO TABS
40.0000 mg | ORAL_TABLET | Freq: Every day | ORAL | Status: DC
  Administered 2023-04-25 – 2023-04-27 (×3): 40 mg via ORAL
  Filled 2023-04-25 (×3): qty 2

## 2023-04-25 MED ORDER — UMECLIDINIUM BROMIDE 62.5 MCG/ACT IN AEPB
1.0000 | INHALATION_SPRAY | Freq: Every day | RESPIRATORY_TRACT | Status: DC
  Administered 2023-04-25 – 2023-05-05 (×9): 1 via RESPIRATORY_TRACT
  Filled 2023-04-25 (×2): qty 7

## 2023-04-25 MED ORDER — SODIUM CHLORIDE 0.9 % IV BOLUS
1000.0000 mL | Freq: Once | INTRAVENOUS | Status: AC
  Administered 2023-04-25: 1000 mL via INTRAVENOUS

## 2023-04-25 MED ORDER — FLUTICASONE FUROATE-VILANTEROL 200-25 MCG/ACT IN AEPB
1.0000 | INHALATION_SPRAY | Freq: Every day | RESPIRATORY_TRACT | Status: DC
  Administered 2023-04-25 – 2023-05-05 (×9): 1 via RESPIRATORY_TRACT
  Filled 2023-04-25 (×2): qty 28

## 2023-04-25 MED ORDER — OLANZAPINE 5 MG PO TABS
2.5000 mg | ORAL_TABLET | Freq: Every day | ORAL | Status: DC
  Administered 2023-04-25: 2.5 mg via ORAL
  Filled 2023-04-25: qty 1

## 2023-04-25 MED ORDER — POTASSIUM CHLORIDE CRYS ER 20 MEQ PO TBCR
40.0000 meq | EXTENDED_RELEASE_TABLET | Freq: Two times a day (BID) | ORAL | Status: DC
  Administered 2023-04-25 – 2023-04-26 (×3): 40 meq via ORAL
  Filled 2023-04-25 (×3): qty 2

## 2023-04-25 MED ORDER — SODIUM CHLORIDE 0.9 % IV SOLN
1.0000 g | INTRAVENOUS | Status: DC
  Administered 2023-04-26 (×2): 1 g via INTRAVENOUS
  Filled 2023-04-25 (×2): qty 10

## 2023-04-25 MED ORDER — ACETAMINOPHEN 325 MG PO TABS
650.0000 mg | ORAL_TABLET | Freq: Four times a day (QID) | ORAL | Status: DC | PRN
  Filled 2023-04-25: qty 2

## 2023-04-25 MED ORDER — HYDROCODONE-ACETAMINOPHEN 5-325 MG PO TABS
1.0000 | ORAL_TABLET | ORAL | Status: DC | PRN
  Administered 2023-04-25 – 2023-04-27 (×3): 1 via ORAL
  Administered 2023-04-28 – 2023-04-29 (×2): 2 via ORAL
  Administered 2023-04-29: 1 via ORAL
  Filled 2023-04-25 (×2): qty 2
  Filled 2023-04-25 (×4): qty 1

## 2023-04-25 MED ORDER — MELATONIN 5 MG PO TABS
5.0000 mg | ORAL_TABLET | Freq: Every day | ORAL | Status: DC
  Administered 2023-04-25 – 2023-04-27 (×3): 5 mg via ORAL
  Filled 2023-04-25 (×3): qty 1

## 2023-04-25 MED ORDER — FLUOXETINE HCL 20 MG PO CAPS
20.0000 mg | ORAL_CAPSULE | Freq: Every day | ORAL | Status: DC
  Administered 2023-04-25 – 2023-04-26 (×2): 20 mg via ORAL
  Filled 2023-04-25 (×2): qty 1

## 2023-04-25 MED ORDER — METOPROLOL SUCCINATE ER 25 MG PO TB24
12.5000 mg | ORAL_TABLET | Freq: Every day | ORAL | Status: DC
  Administered 2023-04-25 – 2023-04-27 (×3): 12.5 mg via ORAL
  Filled 2023-04-25 (×2): qty 1
  Filled 2023-04-25: qty 0.5

## 2023-04-25 MED ORDER — K PHOS MONO-SOD PHOS DI & MONO 155-852-130 MG PO TABS
500.0000 mg | ORAL_TABLET | Freq: Four times a day (QID) | ORAL | Status: AC
  Administered 2023-04-25 – 2023-04-26 (×4): 500 mg via ORAL
  Filled 2023-04-25 (×5): qty 2

## 2023-04-25 MED ORDER — POTASSIUM CHLORIDE 10 MEQ/100ML IV SOLN
10.0000 meq | INTRAVENOUS | Status: AC
  Administered 2023-04-25 (×2): 10 meq via INTRAVENOUS
  Filled 2023-04-25 (×2): qty 100

## 2023-04-25 NOTE — Hospital Course (Addendum)
 78 y.o. female with medical history significant for CVA with left-sided residual weakness, depression, anxiety, neuropathy, insomnia, atrial fibrillation on Eliquis, COPD on home O2 at 3 L who was brought to the ED after she tried to stab herself with scissors.  She endorses feeling suicidal.  Patient recently recovered from a bout of pneumonia, completing antibiotic treatment.  She did endorse diarrhea in the few days prior.  She has had very poor oral intake. ED course and data review: BP slightly elevated on arrival at 170/72, pulse in the 50s otherwise normal vitals Labs notable for potassium of 2.2 and magnesium 1.6.  TSH slightly elevated at 5.528.  Free T4 also elevated at 1.66 Urinalysis with moderate leukocytes CBC WNL, BMP except for potassium WNL. UDS, salicylate, EtOH and acetaminophen levels within normal parameters EKG, personally reviewed and interpreted showing sinus bradycardia at 51 and prolonged QT to 648   Patient was started on ceftriaxone for possibility of UTI.  Also given oral and IV potassium repletion.   Seen by behavioral health,    Hospitalist consulted for admission.   3/28.  Potassium still low this morning IV and oral potassium ordered.  3/29.  Psychiatry changed her medications over to Remeron at night secondary to prolonged QT.  Electrolytes much improved.  Patient felt short of breath this morning.  Chest x-ray showing overall improvement in bilateral airspace opacities from prior studies.  Patient had worsening shortness of breath and was acidotic and sent to the ICU for BiPAP and started on aggressive antibiotics and steroids.  3/30.  Patient did not like the BiPAP mask.  Placed back on nasal cannula.  On IV fluids.  Continue Zosyn.  Discontinue vancomycin with MRSA PCR being negative.  Continue Solu-Medrol.

## 2023-04-25 NOTE — Hospital Course (Signed)
 Marland Kitchen

## 2023-04-25 NOTE — Assessment & Plan Note (Addendum)
 On low-dose Toprol.  Watch with IV fluid hydration.

## 2023-04-25 NOTE — ED Notes (Signed)
TTS at the bedside. 

## 2023-04-25 NOTE — Progress Notes (Addendum)
 PHARMACY CONSULT NOTE - FOLLOW UP  Pharmacy Consult for Electrolyte Monitoring and Replacement   Recent Labs: Potassium (mmol/L)  Date Value  04/25/2023 4.0  12/26/2012 3.3 (L)   Magnesium (mg/dL)  Date Value  91/47/8295 2.0  12/25/2012 1.8   Calcium (mg/dL)  Date Value  62/13/0865 8.3 (L)   Calcium, Total (mg/dL)  Date Value  78/46/9629 9.1   Albumin (g/dL)  Date Value  52/84/1324 2.4 (L)  03/23/2015 3.6  12/20/2012 3.5   Phosphorus (mg/dL)  Date Value  40/10/2723 1.4 (L)   Sodium (mmol/L)  Date Value  04/25/2023 142  01/01/2021 143  12/26/2012 144     Assessment: 78 yo F with PMH including CVA, Afib (on Eliquis) presenting with hypokalemia. Per patient, recently experienced diarrhea for a few days due to taking antibiotic for pneumonia. QTc on ECG this morning was 648. Pharmacy has been consulted to manage electrolytes.  Goal of Therapy:  K >4.0, Mg >2.0  Plan: Phos 1.4 >> Kphos-Neutral 500 mg PO for 4 doses No other electrolyte replacement currently warranted Recheck electrolytes tomorrow morning  Will M. Dareen Piano, PharmD Clinical Pharmacist 04/25/2023 3:59 PM

## 2023-04-25 NOTE — ED Notes (Signed)
 Pt ABCs intact. RR even and unlabored. Pt in NAD. Bed in lowest locked position. Call bell in reach. Denies needs at this time.

## 2023-04-25 NOTE — Progress Notes (Signed)
 PHARMACY CONSULT NOTE - FOLLOW UP  Pharmacy Consult for Electrolyte Monitoring and Replacement   Recent Labs: Potassium (mmol/L)  Date Value  04/24/2023 2.2 (LL)  12/26/2012 3.3 (L)   Magnesium (mg/dL)  Date Value  16/10/9602 1.6 (L)  12/25/2012 1.8   Calcium (mg/dL)  Date Value  54/09/8117 9.1   Calcium, Total (mg/dL)  Date Value  14/78/2956 9.1   Albumin (g/dL)  Date Value  21/30/8657 2.4 (L)  03/23/2015 3.6  12/20/2012 3.5   Sodium (mmol/L)  Date Value  04/24/2023 142  01/01/2021 143  12/26/2012 144     Assessment: 3/27 @ 2321:  K = 2.2                       Mag = 1.6  Goal of Therapy:  Electrolytes WNL   Plan:  KCl 10 mEq IV X 2 completed on 3/28 @ 0317. KCl 40 mEq PO X 1 completed on 3/28 @ 0108. MagSulfate 2 gm IV X 1 ordered for 3/28 @ ~ 0400.  - Will order KCl 10 mEq IV X 6 to start on 3/28 @ ~ 0430. - Will recheck electrolytes Q4H  - Will recheck Mag on 3/29 with AM labs.   Scherrie Gerlach ,PharmD Clinical Pharmacist 04/25/2023 4:28 AM

## 2023-04-25 NOTE — Assessment & Plan Note (Deleted)
 No acute issues suspected

## 2023-04-25 NOTE — Assessment & Plan Note (Signed)
 Not currently exacerbated Continue home inhalers.  DuoNebs as needed

## 2023-04-25 NOTE — ED Notes (Signed)
 Beth [name], TTS called and asked to round on pt, per MD Modesto Charon. Pt candidate for admission to hospital but needs to be evaluated with psych team first.

## 2023-04-25 NOTE — H&P (Signed)
 History and Physical    Patient: Beth [name]:[id number] DOB: 1945/03/16 DOA: 04/25/2023 DOS: the patient was seen and examined on 04/25/2023 PCP: Smitty Cords, DO  Patient coming from: Home  Chief Complaint:  Chief Complaint  Patient presents with   Mental Health Problem    HPI: Beth [name] is a 78 y.o. female with medical history significant for CVA with left-sided residual weakness, depression, anxiety, neuropathy, insomnia, atrial fibrillation on Eliquis, COPD on home O2 at 3 L who was brought to the ED after she tried to stab herself with scissors.  She endorses feeling suicidal.  Patient recently recovered from a bout of pneumonia, completing antibiotic treatment.  She did endorse diarrhea in the few days prior.  She has had very poor oral intake. ED course and data review: BP slightly elevated on arrival at 170/72, pulse in the 50s otherwise normal vitals Labs notable for potassium of 2.2 and magnesium 1.6.  TSH slightly elevated at 5.528.  Free T4 also elevated at 1.66 Urinalysis with moderate leukocytes CBC WNL, BMP except for potassium WNL. UDS, salicylate, EtOH and acetaminophen levels within normal parameters EKG, personally reviewed and interpreted showing sinus bradycardia at 51 and prolonged QT to 648  Patient was started on ceftriaxone for possibility of UTI.  Also given oral and IV potassium repletion.  Seen by behavioral health,   Hospitalist consulted for admission.     Past Medical History:  Diagnosis Date   Allergy    Anxiety    Asthma    Chronic pain syndrome    discharged from pain clinic, hx of narcotics seeking behavior   COPD (chronic obstructive pulmonary disease) (HCC)    CVA (cerebral infarction)    Depression    Fibromyalgia    Headache    Hyperlipidemia    Hypertension    IBS (irritable bowel syndrome)    Stroke (HCC)    Vitamin D deficiency    Past Surgical History:  Procedure Laterality Date   ABDOMINAL  HYSTERECTOMY     APPENDECTOMY     BREAST EXCISIONAL BIOPSY  2011   Pt states lump removed, ? side, no scar seen   BREAST SURGERY  2011   biopsy   CERVICAL DISCECTOMY     CHOLECYSTECTOMY     SINUSOTOMY     Social History:  reports that she has been smoking cigarettes. She started smoking about 59 years ago. She has a 14.3 pack-year smoking history. She has never used smokeless tobacco. She reports that she does not drink alcohol and does not use drugs.  Allergies  Allergen Reactions   Bextra  [Valdecoxib]    Compazine [Prochlorperazine Edisylate]     Stroke-like symptoms   Lithium Carbonate     Leg weakness   Lyrica [Pregabalin]     Family History  Problem Relation Age of Onset   Anxiety disorder Mother    Depression Mother    Breast cancer Mother 92   Cancer Father    Gallbladder disease Father    Alcohol abuse Father    Depression Father    Bipolar disorder Son     Prior to Admission medications   Medication Sig Start Date End Date Taking? Authorizing Provider  acetaminophen (TYLENOL) 500 MG tablet Take 500 mg by mouth 2 (two) times daily as needed.    [provider]  albuterol (VENTOLIN HFA) 108 (90 Base) MCG/ACT inhaler Inhale 1-2 puffs into the lungs every 6 (six) hours as needed for wheezing or shortness  of breath. 02/06/22   Karamalegos, Netta Neat, DO  amiodarone (PACERONE) 200 MG tablet Take 1 tablet (200 mg total) by mouth 2 (two) times daily for 12 days, THEN 1 tablet (200 mg total) daily. 09/13/22 10/25/22  Leeroy Bock, MD  apixaban (ELIQUIS) 5 MG TABS tablet Take 1 tablet (5 mg total) by mouth 2 (two) times daily. 08/30/22 09/29/22  Phineas Semen, MD  atorvastatin (LIPITOR) 40 MG tablet TAKE 1 TABLET BY MOUTH EVERY DAY Patient taking differently: Take 40 mg by mouth at bedtime. TAKE 1 TABLET BY MOUTH EVERY DAY 12/26/21   Karamalegos, Netta Neat, DO  clobetasol ointment (TEMOVATE) 0.05 % Apply 1 Application topically daily.    [provider]  FLUoxetine (PROZAC) 20 MG capsule Take 1 capsule (20 mg total) by mouth daily. 08/28/22   Loyce Dys, MD  furosemide (LASIX) 40 MG tablet Take 1 tablet (40 mg total) by mouth daily. If swelling is less, can take only 1 pill. 09/13/22 10/13/22  Leeroy Bock, MD  gabapentin (NEURONTIN) 100 MG capsule Take 1 capsule (100 mg total) by mouth 3 (three) times daily. 09/13/22   Leeroy Bock, MD  levothyroxine (SYNTHROID) 25 MCG tablet Take 1 tablet (25 mcg total) by mouth daily at 6 (six) AM. 09/14/22   Leeroy Bock, MD  melatonin 5 MG TABS Take 1 tablet (5 mg total) by mouth at bedtime. 09/13/22   Leeroy Bock, MD  metoprolol tartrate (LOPRESSOR) 25 MG tablet Take 0.5 tablets (12.5 mg total) by mouth 2 (two) times daily. 09/13/22   Leeroy Bock, MD  montelukast (SINGULAIR) 10 MG tablet TAKE 1 TABLET(10 MG) BY MOUTH AT BEDTIME 09/10/22   Karamalegos, Alexander J, DO  montelukast (SINGULAIR) 10 MG tablet Take 1 tablet (10 mg total) by mouth at bedtime. 09/13/22   Leeroy Bock, MD  polyethylene glycol (MIRALAX / GLYCOLAX) 17 g packet Take 17 g by mouth daily as needed for mild constipation. 09/13/22   Leeroy Bock, MD  traZODone (DESYREL) 50 MG tablet Take 1 tablet (50 mg total) by mouth at bedtime. 09/13/22   Leeroy Bock, MD  TRELEGY ELLIPTA 200-62.5-25 MCG/ACT AEPB INHALE 1 PUFF INTO THE LUNGS DAILY 08/14/22   Salena Saner, MD    Physical Exam: Vitals:   04/24/23 2245 04/24/23 2246 04/25/23 0230  BP: (!) 170/72  (!) 124/42  Pulse: (!) 52  (!) 58  Resp: 18  (!) 22  Temp: 97.6 F (36.4 C)    SpO2: 91%  100%  Weight:  54.4 kg   Height:  5\' 5"  (1.651 m)    Physical Exam Vitals and nursing note reviewed.  Constitutional:      General: She is not in acute distress.    Comments: Frail elderly female  HENT:     Head: Normocephalic and atraumatic.  Cardiovascular:     Rate and Rhythm: Regular rhythm. Bradycardia present.     Heart sounds:  Normal heart sounds.  Pulmonary:     Effort: Pulmonary effort is normal.     Breath sounds: Normal breath sounds.  Abdominal:     Palpations: Abdomen is soft.     Tenderness: There is no abdominal tenderness.  Neurological:     General: No focal deficit present.     Labs on Admission: I have personally reviewed following labs and imaging studies  CBC: Recent Labs  Lab 04/24/23 2321  WBC 9.4  HGB 13.1  HCT 40.3  MCV  94.4  PLT 279   Basic Metabolic Panel: Recent Labs  Lab 04/24/23 2321  NA 142  K 2.2*  CL 102  CO2 29  GLUCOSE 93  BUN 15  CREATININE 0.90  CALCIUM 9.1  MG 1.6*   GFR: Estimated Creatinine Clearance: 43.5 mL/min (by C-G formula based on SCr of 0.9 mg/dL). Liver Function Tests: No results for input(s): "AST", "ALT", "ALKPHOS", "BILITOT", "PROT", "ALBUMIN" in the last 168 hours. No results for input(s): "LIPASE", "AMYLASE" in the last 168 hours. No results for input(s): "AMMONIA" in the last 168 hours. Coagulation Profile: No results for input(s): "INR", "PROTIME" in the last 168 hours. Cardiac Enzymes: No results for input(s): "CKTOTAL", "CKMB", "CKMBINDEX", "TROPONINI" in the last 168 hours. BNP (last 3 results) No results for input(s): "PROBNP" in the last 8760 hours. HbA1C: No results for input(s): "HGBA1C" in the last 72 hours. CBG: No results for input(s): "GLUCAP" in the last 168 hours. Lipid Profile: No results for input(s): "CHOL", "HDL", "LDLCALC", "TRIG", "CHOLHDL", "LDLDIRECT" in the last 72 hours. Thyroid Function Tests: Recent Labs    04/24/23 2321  TSH 5.528*  FREET4 1.66*   Anemia Panel: No results for input(s): "VITAMINB12", "FOLATE", "FERRITIN", "TIBC", "IRON", "RETICCTPCT" in the last 72 hours. Urine analysis:    Component Value Date/Time   COLORURINE YELLOW (A) 04/24/2023 2321   APPEARANCEUR CLEAR (A) 04/24/2023 2321   APPEARANCEUR Hazy 12/25/2012 1507   LABSPEC 1.012 04/24/2023 2321   LABSPEC 1.015 12/25/2012 1507    PHURINE 6.0 04/24/2023 2321   GLUCOSEU NEGATIVE 04/24/2023 2321   GLUCOSEU Negative 12/25/2012 1507   HGBUR SMALL (A) 04/24/2023 2321   BILIRUBINUR NEGATIVE 04/24/2023 2321   BILIRUBINUR Negative 06/26/2022 1525   BILIRUBINUR Negative 12/25/2012 1507   KETONESUR NEGATIVE 04/24/2023 2321   PROTEINUR NEGATIVE 04/24/2023 2321   UROBILINOGEN 0.2 06/26/2022 1525   NITRITE NEGATIVE 04/24/2023 2321   LEUKOCYTESUR MODERATE (A) 04/24/2023 2321   LEUKOCYTESUR 3+ 12/25/2012 1507    Radiological Exams on Admission: No results found.   Data Reviewed: Relevant notes from primary care and specialist visits, past discharge summaries as available in EHR, including Care Everywhere. Prior diagnostic testing as pertinent to current admission diagnoses Updated medications and problem lists for reconciliation ED course, including vitals, labs, imaging, treatment and response to treatment Triage notes, nursing and pharmacy notes and ED provider's notes Notable results as noted in HPI   Assessment and Plan: * Hypokalemia Possibly related to recent diarrhea Replete and monitor  Hypomagnesemia Replete and monitor  UTI (urinary tract infection) Urinalysis with large leuks Was given a dose of Rocephin in the ED Holding further antibiotics pending culture Follow-up culture  Prolonged QT interval Avoid QT prolonging drugs Continuous cardiac monitoring  Depression with suicidal ideation Involuntary commitment Seen by behavioral health with recommendations for one-to-one suicide precautions Fluoxetine and Zyprexa per psych  For inpatient psych once medically cleared  Atrial fibrillation with slow ventricular response (HCC) Heart rate in the 50s Patient currently on amiodarone and apixaban Continue to monitor rate  HTN (hypertension) Blood pressure controlled  Other specified hypothyroidism Both TSH and T4 elevated Will continue Synthroid 25 mcg  Moderate aortic valve stenosis No  acute issues suspected  Asthma with COPD (HCC) Not currently exacerbated Continue home inhalers.  DuoNebs as needed  Chronic diastolic congestive heart failure (HCC) Clinically euvolemic to dry Will hold Lasix Holding metoprolol due to bradycardia     DVT prophylaxis: Lovenox  Consults: none  Advance Care Planning:   Code Status:  Full Code   Family Communication: none  Disposition Plan: Back to previous home environment  Severity of Illness: The appropriate patient status for this patient is OBSERVATION. Observation status is judged to be reasonable and necessary in order to provide the required intensity of service to ensure the patient's safety. The patient's presenting symptoms, physical exam findings, and initial radiographic and laboratory data in the context of their medical condition is felt to place them at decreased risk for further clinical deterioration. Furthermore, it is anticipated that the patient will be medically stable for discharge from the hospital within 2 midnights of admission.   Author: Andris Baumann, MD 04/25/2023 3:55 AM  For on call review [email or web address].

## 2023-04-25 NOTE — Assessment & Plan Note (Addendum)
 Patient on Rocephin but not a lot of colonies growing out of culture.

## 2023-04-25 NOTE — Progress Notes (Signed)
 PHARMACY CONSULT NOTE - FOLLOW UP  Pharmacy Consult for Electrolyte Monitoring and Replacement   Recent Labs: Potassium (mmol/L)  Date Value  04/25/2023 2.6 (LL)  12/26/2012 3.3 (L)   Magnesium (mg/dL)  Date Value  09/81/1914 1.6 (L)  12/25/2012 1.8   Calcium (mg/dL)  Date Value  78/29/5621 8.1 (L)   Calcium, Total (mg/dL)  Date Value  30/86/5784 9.1   Albumin (g/dL)  Date Value  69/62/9528 2.4 (L)  03/23/2015 3.6  12/20/2012 3.5   Sodium (mmol/L)  Date Value  04/25/2023 143  01/01/2021 143  12/26/2012 144     Assessment: 3/27 @ 2321: K = 2.2                        Mg = 1.6  Goal of Therapy:  Electrolytes WNL   Plan: K 2.6 >> potassium chloride 40 mEq x 1 already ordered Mg 1.6 >> magnesium chloride 2 g x 1 already ordered Recheck electrolytes at 1300  Will M. Dareen Piano, PharmD Clinical Pharmacist 04/25/2023 10:08 AM

## 2023-04-25 NOTE — Assessment & Plan Note (Signed)
 Both TSH and T4 elevated Will continue Synthroid 25 mcg

## 2023-04-25 NOTE — Assessment & Plan Note (Addendum)
 Replaced

## 2023-04-25 NOTE — Assessment & Plan Note (Addendum)
 Continue Eliquis for anticoagulation.  Holding amiodarone with prolonged QT.  low-dose Toprol-XL at night.

## 2023-04-25 NOTE — Assessment & Plan Note (Addendum)
 Stool studies negative

## 2023-04-25 NOTE — Assessment & Plan Note (Deleted)
 Blood pressure controlled.

## 2023-04-25 NOTE — ED Notes (Signed)
Vol /psych consult pending 

## 2023-04-25 NOTE — Assessment & Plan Note (Addendum)
 Avoid QT prolonging drugs Low-dose beta-blocker EKG pending from this morning

## 2023-04-25 NOTE — Progress Notes (Signed)
 Progress Note   Patient: Beth [name]:[id number] DOB: 1945-03-16 DOA: 04/25/2023     0 DOS: the patient was seen and examined on 04/25/2023   Brief hospital course: 78 y.o. female with medical history significant for CVA with left-sided residual weakness, depression, anxiety, neuropathy, insomnia, atrial fibrillation on Eliquis, COPD on home O2 at 3 L who was brought to the ED after she tried to stab herself with scissors.  She endorses feeling suicidal.  Patient recently recovered from a bout of pneumonia, completing antibiotic treatment.  She did endorse diarrhea in the few days prior.  She has had very poor oral intake. ED course and data review: BP slightly elevated on arrival at 170/72, pulse in the 50s otherwise normal vitals Labs notable for potassium of 2.2 and magnesium 1.6.  TSH slightly elevated at 5.528.  Free T4 also elevated at 1.66 Urinalysis with moderate leukocytes CBC WNL, BMP except for potassium WNL. UDS, salicylate, EtOH and acetaminophen levels within normal parameters EKG, personally reviewed and interpreted showing sinus bradycardia at 51 and prolonged QT to 648   Patient was started on ceftriaxone for possibility of UTI.  Also given oral and IV potassium repletion.   Seen by behavioral health,    Hospitalist consulted for admission.   3/28.  Potassium still low this morning IV and oral potassium ordered.  Recheck labs today.  Physical therapy evaluation.   Assessment and Plan: * Hypokalemia Repeat potassium still low at 2.6.  Repeat oral and IV potassium and recheck today and replace again if necessary  Hypomagnesemia Replace.  Recheck level today.  UTI (urinary tract infection) Follow-up urine culture on empiric Rocephin.  Long QT interval Avoid QT prolonging drugs Continuous cardiac monitoring  Depression with suicidal ideation Involuntary commitment Seen by behavioral health with recommendations for one-to-one suicide precautions Fluoxetine  and Zyprexa per psych  For inpatient psych once medically cleared  Atrial fibrillation with slow ventricular response (HCC) Heart rate in the 50s Continue Eliquis for anticoagulation.  Holding amiodarone with prolonged QT.  Restart low-dose Toprol-XL at night.  Diarrhea Stool studies if any further diarrhea  Other specified hypothyroidism Both TSH and T4 elevated Will continue Synthroid 25 mcg  Moderate aortic valve stenosis No acute issues suspected  Asthma with COPD (HCC) Not currently exacerbated Continue home inhalers.  DuoNebs as needed  Chronic diastolic congestive heart failure (HCC) Clinically euvolemic to dry Will hold Lasix Restart low-dose Toprol-XL at night.        Subjective: Patient stated she has a poor appetite and has not been eating well.  She had a recent pneumonia and was on doxycycline.  States she also had diarrhea.  Admitted medically for electrolyte abnormalities.  Seen by psychiatry team and they will watch on the psychiatry floor once medically cleared.  Physical Exam: Vitals:   04/25/23 0500 04/25/23 0530 04/25/23 0600 04/25/23 0905  BP: (!) 123/47 (!) 117/49 (!) 123/42 (!) 131/45  Pulse: (!) 58 (!) 57 (!) 56 67  Resp: 20 17 16  (!) 22  Temp:    97.8 F (36.6 C)  TempSrc:    Oral  SpO2: 92% 90% 94% 92%  Weight:      Height:       Physical Exam HENT:     Head: Normocephalic.     Mouth/Throat:     Pharynx: No oropharyngeal exudate.  Eyes:     General: Lids are normal.     Conjunctiva/sclera: Conjunctivae normal.  Cardiovascular:     Rate and  Rhythm: Normal rate and regular rhythm.     Heart sounds: Normal heart sounds, S1 normal and S2 normal.  Pulmonary:     Breath sounds: No decreased breath sounds, wheezing, rhonchi or rales.  Abdominal:     Palpations: Abdomen is soft.     Tenderness: There is no abdominal tenderness.  Musculoskeletal:     Right lower leg: No swelling.     Left lower leg: No swelling.  Skin:    General:  Skin is warm.     Findings: No rash.  Neurological:     Mental Status: She is alert.     Comments: Answers all questions appropriately.  Positive for tardive dyskinesia.     Data Reviewed: Potassium 2.6, magnesium 1.6  Family Communication: Updated son on the phone  Disposition: Status is: Observation Continue to replace potassium IV and orally today.  Recheck potassium later today.  Psychiatry team wants to take to the psychiatry floor once medically cleared.  Will get physical therapy consultation.  Since the patient did have diarrhea will order stool studies if she has any further diarrhea.  Planned Discharge Destination: Psychiatry floor    Time spent: 28 minutes  Author: Alford Highland, MD 04/25/2023 12:21 PM  For on call review [email or web address].

## 2023-04-25 NOTE — Assessment & Plan Note (Addendum)
 Involuntary commitment Patient still on one-to-one observation.  Psychiatry changing medications over to Remeron with prolonged QT.

## 2023-04-25 NOTE — Consult Note (Addendum)
 [facility] Psychiatric Consult Initial  Patient Name: .Beth [name]  MRN: [id number]  DOB: 1945-10-04  Consult Order details:  Orders (From admission, onward)     Start     Ordered   04/25/23 0102  CONSULT TO CALL ACT TEAM       Ordering Provider: Pilar Jarvis, MD  Provider:  (Not yet assigned)  Question:  Reason for Consult?  Answer:  Psych consult   04/25/23 0101   04/25/23 0102  IP CONSULT TO PSYCHIATRY       Ordering Provider: Pilar Jarvis, MD  Provider:  (Not yet assigned)  Question Answer Comment  Place call to: ED   Reason for Consult Admit      04/25/23 0101             Mode of Visit: Tele-visit Virtual Statement:TELE PSYCHIATRY ATTESTATION & CONSENT As the provider for this telehealth consult, I attest that I verified the patient's identity using two separate identifiers, introduced myself to the patient, provided my credentials, disclosed my location, and performed this encounter via a HIPAA-compliant, real-time, face-to-face, two-way, interactive audio and video platform and with the full consent and agreement of the patient (or guardian as applicable.) Patient physical location: [facility]. Telehealth provider physical location: home office in state of [place].   Video start time:   Video end time:      Psychiatry Consult Evaluation  Service Date: April 25, 2023 LOS:  LOS: 0 days  Chief Complaint suicide attempt  Primary Psychiatric Diagnoses  MDD severe with psychotic features   Assessment  Beth [name] is a 78 y.o. female admitted: Presented to the EDfor 04/25/2023 12:02 AM for suicidal ideation. She carries the psychiatric diagnoses of MDD and has a past medical history of fibromyalgia..   The patient's current presentation is most consistent with **Major Depressive Disorder, Severe, with Psychotic Features**, given her pervasive depressive symptoms, active suicidal ideation, and auditory hallucinations involving her deceased  father. She meets criteria for psychiatric inpatient hospitalization based on active suicidal ideation, impaired judgment, and inability to maintain safety. Diagnoses:  Active Hospital problems: Active Problems:   * No active hospital problems. *    Plan   ##Psychiatric medications  2.5 mg  Zyprexa  and 20 mg fluoxetine- qhs  Not specifically addressed in this encounter   ## Disposition:-- We recommend inpatient psychiatric hospitalization when medically cleared. Patient is under voluntary admission status at this time; please IVC if attempts to leave hospital.  ## Behavioral / Environmental: - No specific recommendations at this time.     ## Safety and Observation Level:  - Based on my clinical evaluation, I estimate the patient to be at moderate risk of self harm in the current setting. - At this time, we recommend 1-1 observation. This decision is based on my review of the chart including patient's history and current presentation, interview of the patient, mental status examination, and consideration of suicide risk including evaluating suicidal ideation, plan, intent, suicidal or self-harm behaviors, risk factors, and protective factors. This judgment is based on our ability to directly address suicide risk, implement suicide prevention strategies, and develop a safety plan while the patient is in the clinical setting. Please contact our team if there is a concern that risk level has changed.  CSSR Risk Category:C-SSRS RISK CATEGORY: Error: Q2 is Yes, you must answer 3, 4, and 5  Suicide Risk Assessment: Patient has following modifiable risk factors for suicide: active suicidal ideation, under treated  depression , and lack of access to outpatient mental health resources, which we are addressing by inpatient admission. Patient has following non-modifiable or demographic risk factors for suicide: psychiatric hospitalization Patient has the following protective factors against suicide:  Access to outpatient mental health care  Thank you for this consult request. Recommendations have been communicated to the primary team.  We will recommend inpatient at this time.   De Burrs, NP       History of Present Illness  Relevant Aspects of Hospital ED Course:  Admitted on 04/25/2023 for suicide attempt.   Patient Report:  78 year old female presents to the ED stating, "I didn't want to live. I was gonna stab me with scissors." She endorses active suicidal ideation and reports that her dog recently died, which she identifies as a significant emotional stressor. She denies any history of suicide attempts but reports a prior psychiatric inpatient admission at [facility]. She reports poor sleep despite taking a sleeping pill and poor appetite. She admits to intentionally restricting her food intake, stating, "I think if I don't eat, I will die." She further expresses frustration that "it's taking too long to do it that way." The patient also endorses auditory hallucinations, reporting that she spoke to her deceased father, who told her he is "waiting for her."  Psych ROS:  Depression: yes Anxiety:  no Mania (lifetime and current): no Psychosis: (lifetime and current): yes  Review of Systems  Constitutional: Negative.   HENT: Negative.    Eyes: Negative.   Respiratory: Negative.    Cardiovascular: Negative.   Gastrointestinal: Negative.   Genitourinary: Negative.   Musculoskeletal:  Positive for myalgias.  Skin: Negative.   Neurological: Negative.   Psychiatric/Behavioral:  Positive for depression and suicidal ideas.      Psychiatric and Social History  Psychiatric History:  Information collected from patient and chart review  Prev Dx/Sx: Depression Current Psych Provider: Unknown Home Meds (current): Unknown Previous Med Trials: Unknown Therapy: None  Prior Psych Hospitalization: Patient reports 1 Prior Self Harm: None Prior Violence: None  Family Psych  History: No pertinent family psych history Family Hx suicide: No pertinent family suicide history  Social History:  Developmental Hx: Unknown Educational Hx: Unknown Occupational Hx: Unknown Legal Hx: Unknown Living Situation: Patient resides in an assisted living facility  Spiritual Hx: Unknown Access to weapons/lethal means: None  Substance History Alcohol: None  Tobacco: None Illicit drugs: None Prescription drug abuse: None Rehab hx: None  Exam Findings   Vital Signs:  Temp:  [97.6 F (36.4 C)] 97.6 F (36.4 C) (03/27 2245) Pulse Rate:  [52] 52 (03/27 2245) Resp:  [18] 18 (03/27 2245) BP: (170)/(72) 170/72 (03/27 2245) SpO2:  [91 %] 91 % (03/27 2245) Weight:  [54.4 kg] 54.4 kg (03/27 2246) Blood pressure (!) 170/72, pulse (!) 52, temperature 97.6 F (36.4 C), resp. rate 18, height 5\' 5"  (1.651 m), weight 54.4 kg, SpO2 91%. Body mass index is 19.97 kg/m.  Physical Exam HENT:     Head: Normocephalic.     Nose: Nose normal.     Mouth/Throat:     Mouth: Mucous membranes are dry.  Eyes:     Extraocular Movements: Extraocular movements intact.  Pulmonary:     Effort: Pulmonary effort is normal.  Musculoskeletal:     Cervical back: Normal range of motion.  Skin:    General: Skin is dry.  Neurological:     General: No focal deficit present.     Mental Status: She is  alert.    Mental Status Exam: General Appearance: Disheveled  Orientation:  Full (Time, Place, and Person)  Memory:  Immediate;   Fair  Concentration:  Concentration: Good  Recall:  Fair  Attention  Good  Eye Contact:  Fair  Speech:  Slow  Language:  Good  Volume:  Decreased  Mood: Depressed  Affect:  Congruent  Thought Process:  Goal Directed  Thought Content:  WDL  Suicidal Thoughts:  No  Homicidal Thoughts:  No  Judgement:  Fair  Insight:  Fair  Psychomotor Activity:  Tremor  Akathisia:  No  Fund of Knowledge:  Fair      Assets:  Others:  housing  Cognition:  WNL  ADL's:   Intact  AIMS (if indicated):   n/a     Other History   These have been pulled in through the EMR, reviewed, and updated if appropriate.  Family History:  The patient's family history includes Alcohol abuse in her father; Anxiety disorder in her mother; Bipolar disorder in her son; Breast cancer (age of onset: 67) in her mother; Cancer in her father; Depression in her father and mother; Gallbladder disease in her father.  Medical History: Past Medical History:  Diagnosis Date   Allergy    Anxiety    Asthma    Chronic pain syndrome    discharged from pain clinic, hx of narcotics seeking behavior   COPD (chronic obstructive pulmonary disease) (HCC)    CVA (cerebral infarction)    Depression    Fibromyalgia    Headache    Hyperlipidemia    Hypertension    IBS (irritable bowel syndrome)    Stroke (HCC)    Vitamin D deficiency     Surgical History: Past Surgical History:  Procedure Laterality Date   ABDOMINAL HYSTERECTOMY     APPENDECTOMY     BREAST EXCISIONAL BIOPSY  2011   Pt states lump removed, ? side, no scar seen   BREAST SURGERY  2011   biopsy   CERVICAL DISCECTOMY     CHOLECYSTECTOMY     SINUSOTOMY       Medications:   Current Facility-Administered Medications:    potassium chloride 10 mEq in 100 mL IVPB, 10 mEq, Intravenous, Q1 Hr x 2, Pilar Jarvis, MD, Last Rate: 100 mL/hr at 04/25/23 0127, 10 mEq at 04/25/23 0127  Current Outpatient Medications:    acetaminophen (TYLENOL) 500 MG tablet, Take 500 mg by mouth 2 (two) times daily as needed., Disp: , Rfl:    albuterol (VENTOLIN HFA) 108 (90 Base) MCG/ACT inhaler, Inhale 1-2 puffs into the lungs every 6 (six) hours as needed for wheezing or shortness of breath., Disp: 18 g, Rfl: 2   amiodarone (PACERONE) 200 MG tablet, Take 1 tablet (200 mg total) by mouth 2 (two) times daily for 12 days, THEN 1 tablet (200 mg total) daily., Disp: , Rfl:    apixaban (ELIQUIS) 5 MG TABS tablet, Take 1 tablet (5 mg total) by mouth 2  (two) times daily., Disp: 60 tablet, Rfl: 1   atorvastatin (LIPITOR) 40 MG tablet, TAKE 1 TABLET BY MOUTH EVERY DAY (Patient taking differently: Take 40 mg by mouth at bedtime. TAKE 1 TABLET BY MOUTH EVERY DAY), Disp: 90 tablet, Rfl: 3   clobetasol ointment (TEMOVATE) 0.05 %, Apply 1 Application topically daily., Disp: , Rfl:    FLUoxetine (PROZAC) 20 MG capsule, Take 1 capsule (20 mg total) by mouth daily., Disp: 30 capsule, Rfl: 1   furosemide (LASIX) 40 MG tablet,  Take 1 tablet (40 mg total) by mouth daily. If swelling is less, can take only 1 pill., Disp: , Rfl:    gabapentin (NEURONTIN) 100 MG capsule, Take 1 capsule (100 mg total) by mouth 3 (three) times daily., Disp: , Rfl:    levothyroxine (SYNTHROID) 25 MCG tablet, Take 1 tablet (25 mcg total) by mouth daily at 6 (six) AM., Disp: , Rfl:    melatonin 5 MG TABS, Take 1 tablet (5 mg total) by mouth at bedtime., Disp: , Rfl:    metoprolol tartrate (LOPRESSOR) 25 MG tablet, Take 0.5 tablets (12.5 mg total) by mouth 2 (two) times daily., Disp: , Rfl:    montelukast (SINGULAIR) 10 MG tablet, TAKE 1 TABLET(10 MG) BY MOUTH AT BEDTIME, Disp: 90 tablet, Rfl: 1   montelukast (SINGULAIR) 10 MG tablet, Take 1 tablet (10 mg total) by mouth at bedtime., Disp: , Rfl:    polyethylene glycol (MIRALAX / GLYCOLAX) 17 g packet, Take 17 g by mouth daily as needed for mild constipation., Disp: , Rfl:    traZODone (DESYREL) 50 MG tablet, Take 1 tablet (50 mg total) by mouth at bedtime., Disp: , Rfl:    TRELEGY ELLIPTA 200-62.5-25 MCG/ACT AEPB, INHALE 1 PUFF INTO THE LUNGS DAILY, Disp: 60 each, Rfl: 2  Allergies: Allergies  Allergen Reactions   Bextra  [Valdecoxib]    Compazine [Prochlorperazine Edisylate]     Stroke-like symptoms   Lithium Carbonate     Leg weakness   Lyrica [Pregabalin]     De Burrs, NP

## 2023-04-25 NOTE — ED Provider Notes (Addendum)
 [facility] Provider Note    Event Date/Time   First MD Initiated Contact with Patient 04/25/23 0013     (approximate)   History   Mental Health Problem   HPI  Beth [name] is a 78 y.o. female   Past medical history of depression, anxiety, COPD, chronic pain, CVA, fibromyalgia, hypertension hyperlipidemia here with depression and suicidal ideation.  She states that she has been feeling very depressed and has suicidal thoughts.  Apparently she tried to stab herself with scissors no injuries occurred in EMS brought her here.  She denies any ingestions.  She just got over pneumonia last week and finished antibiotics and has been feeling better in terms of her respiratory infectious symptoms.  However the last couple days she developed nausea vomiting diarrhea.  She denies abdominal pain.  She has some slight dysuria.  Had very poor p.o. intake as well.       Physical Exam   Triage Vital Signs: ED Triage Vitals  Encounter Vitals Group     BP 04/24/23 2245 (!) 170/72     Systolic BP Percentile --      Diastolic BP Percentile --      Pulse Rate 04/24/23 2245 (!) 52     Resp 04/24/23 2245 18     Temp 04/24/23 2245 97.6 F (36.4 C)     Temp src --      SpO2 04/24/23 2245 91 %     Weight 04/24/23 2246 120 lb (54.4 kg)     Height 04/24/23 2246 5\' 5"  (1.651 m)     Head Circumference --      Peak Flow --      Pain Score 04/24/23 2317 0     Pain Loc --      Pain Education --      Exclude from Growth Chart --     Most recent vital signs: Vitals:   04/24/23 2245 04/25/23 0230  BP: (!) 170/72 (!) 124/42  Pulse: (!) 52 (!) 58  Resp: 18 (!) 22  Temp: 97.6 F (36.4 C)   SpO2: 91% 100%    General: Awake, no distress.  CV:  Good peripheral perfusion.  Resp:  Normal effort.  Abd:  No distention.  Other:  She looks dehydrated with poor skin turgor and dry mucous membranes.  She has a soft benign abdominal exam to palpation.  She has clear lungs  without focality or wheezing.  She has no obvious trauma to the arms or head.  He is awake alert oriented and cooperative.   ED Results / Procedures / Treatments   Labs (all labs ordered are listed, but only abnormal results are displayed) Labs Reviewed  CBC - Abnormal; Notable for the following components:      Result Value   RDW 15.8 (*)    All other components within normal limits  BASIC METABOLIC PANEL WITH GFR - Abnormal; Notable for the following components:   Potassium 2.2 (*)    All other components within normal limits  SALICYLATE LEVEL - Abnormal; Notable for the following components:   Salicylate Lvl <7.0 (*)    All other components within normal limits  URINALYSIS, ROUTINE W REFLEX MICROSCOPIC - Abnormal; Notable for the following components:   Color, Urine YELLOW (*)    APPearance CLEAR (*)    Hgb urine dipstick SMALL (*)    Leukocytes,Ua MODERATE (*)    Bacteria, UA RARE (*)    All other components within  normal limits  TSH - Abnormal; Notable for the following components:   TSH 5.528 (*)    All other components within normal limits  T4, FREE - Abnormal; Notable for the following components:   Free T4 1.66 (*)    All other components within normal limits  MAGNESIUM - Abnormal; Notable for the following components:   Magnesium 1.6 (*)    All other components within normal limits  URINE CULTURE  ETHANOL  ACETAMINOPHEN LEVEL  URINE DRUG SCREEN, QUALITATIVE ([facility] ONLY)     I ordered and reviewed the above labs they are notable for potassium is very low at 2.2.  EKG  ED ECG REPORT I, Pilar Jarvis, the attending physician, personally viewed and interpreted this ECG.   Date: 04/25/2023  EKG Time: 0101  Rate: 51  Rhythm: sinus  Axis: nl  Intervals: long qtc  ST&T Change: no stemi    PROCEDURES:  Critical Care performed: Yes, see critical care procedure note(s)  .Critical Care  Performed by: Pilar Jarvis, MD Authorized by: Pilar Jarvis, MD   Critical  care provider statement:    Critical care time (minutes):  30   Critical care was time spent personally by me on the following activities:  Development of treatment plan with patient or surrogate, discussions with consultants, evaluation of patient's response to treatment, examination of patient, ordering and review of laboratory studies, ordering and review of radiographic studies, ordering and performing treatments and interventions, pulse oximetry, re-evaluation of patient's condition and review of old charts    MEDICATIONS ORDERED IN ED: Medications  potassium chloride SA (KLOR-CON M) CR tablet 40 mEq (40 mEq Oral Given 04/25/23 0108)  potassium chloride 10 mEq in 100 mL IVPB (0 mEq Intravenous Stopped 04/25/23 0317)  cefTRIAXone (ROCEPHIN) 1 g in sodium chloride 0.9 % 100 mL IVPB (0 g Intravenous Stopped 04/25/23 0155)  sodium chloride 0.9 % bolus 1,000 mL (0 mLs Intravenous Stopped 04/25/23 0249)    External physician / consultants:  I spoke with hospital medicine for admission and regarding care plan for this patient.   IMPRESSION / MDM / ASSESSMENT AND PLAN / ED COURSE  I reviewed the triage vital signs and the nursing notes.                                Patient's presentation is most consistent with acute presentation with potential threat to life or bodily function.  Differential diagnosis includes, but is not limited to, depression suicidal ideation, infection, metabolic derangements   The patient is on the cardiac monitor to evaluate for evidence of arrhythmia and/or significant heart rate changes.  MDM:    She is here for suicidal ideation worsening depression so he got a psychiatric consultation basic labs and toxicologic labs.  Medically however she is got dysuria and signs of urinary tract infection so we will give abx .  She has also had very poor p.o. intake in the setting of her depression also nausea vomiting diarrhea I think attributable to her recent antibiotic  use for pneumonia (symptoms which have resolved) and has now knocked down her potassium to 2.2.  Awaiting EKG as it is difficult given her fine tremors.  Ordered for repletion IV and oral.  Will give fluids because she looks dehydrated.  Admit for UTI, dehydration, hypokalemia.  Psychiatric consultation ordered.   -- Given her suicidal ideation and depression with an attempt at self-harm reported with scissors, I  will place on IVC at this time.       FINAL CLINICAL IMPRESSION(S) / ED DIAGNOSES   Final diagnoses:  Hypokalemia  Acute cystitis without hematuria  Suicidal ideation  Long QT interval     Rx / DC Orders   ED Discharge Orders     None        Note:  This document was prepared using Dragon voice recognition software and may include unintentional dictation errors.    Pilar Jarvis, MD 04/25/23 8469    Pilar Jarvis, MD 04/25/23 [phone]

## 2023-04-26 ENCOUNTER — Inpatient Hospital Stay

## 2023-04-26 DIAGNOSIS — F332 Major depressive disorder, recurrent severe without psychotic features: Secondary | ICD-10-CM | POA: Diagnosis not present

## 2023-04-26 DIAGNOSIS — F411 Generalized anxiety disorder: Secondary | ICD-10-CM

## 2023-04-26 DIAGNOSIS — R45851 Suicidal ideations: Secondary | ICD-10-CM | POA: Diagnosis not present

## 2023-04-26 DIAGNOSIS — J9601 Acute respiratory failure with hypoxia: Secondary | ICD-10-CM

## 2023-04-26 DIAGNOSIS — R197 Diarrhea, unspecified: Secondary | ICD-10-CM

## 2023-04-26 DIAGNOSIS — I272 Pulmonary hypertension, unspecified: Secondary | ICD-10-CM

## 2023-04-26 DIAGNOSIS — E876 Hypokalemia: Secondary | ICD-10-CM | POA: Diagnosis not present

## 2023-04-26 LAB — COMPREHENSIVE METABOLIC PANEL WITH GFR
ALT: 31 U/L (ref 0–44)
AST: 64 U/L — ABNORMAL HIGH (ref 15–41)
Albumin: 2.5 g/dL — ABNORMAL LOW (ref 3.5–5.0)
Alkaline Phosphatase: 95 U/L (ref 38–126)
Anion gap: 16 — ABNORMAL HIGH (ref 5–15)
BUN: 13 mg/dL (ref 8–23)
CO2: 17 mmol/L — ABNORMAL LOW (ref 22–32)
Calcium: 8.5 mg/dL — ABNORMAL LOW (ref 8.9–10.3)
Chloride: 109 mmol/L (ref 98–111)
Creatinine, Ser: 0.94 mg/dL (ref 0.44–1.00)
GFR, Estimated: >60 mL/min (ref >60)
Glucose, Bld: 256 mg/dL — ABNORMAL HIGH (ref 70–99)
Potassium: 4.6 mmol/L (ref 3.5–5.1)
Sodium: 142 mmol/L (ref 135–145)
Total Bilirubin: 0.4 mg/dL (ref 0.0–1.2)
Total Protein: 6.4 g/dL — ABNORMAL LOW (ref 6.5–8.1)

## 2023-04-26 LAB — BLOOD GAS, VENOUS
Acid-base deficit: 7.4 mmol/L — ABNORMAL HIGH (ref 0.0–2.0)
Bicarbonate: 21.4 mmol/L (ref 20.0–28.0)
O2 Saturation: 86.9 %
Patient temperature: 37
pCO2, Ven: 56 mmHg (ref 44–60)
pH, Ven: 7.19 — CL (ref 7.25–7.43)
pO2, Ven: 60 mmHg — ABNORMAL HIGH (ref 32–45)

## 2023-04-26 LAB — GASTROINTESTINAL PANEL BY PCR, STOOL (REPLACES STOOL CULTURE)

## 2023-04-26 LAB — CBC WITH DIFFERENTIAL/PLATELET
Abs Immature Granulocytes: 0.08 10*3/uL — ABNORMAL HIGH (ref 0.00–0.07)
Basophils Absolute: 0 10*3/uL (ref 0.0–0.1)
Basophils Relative: 0 %
Eosinophils Absolute: 0.3 10*3/uL (ref 0.0–0.5)
Eosinophils Relative: 2 %
HCT: 42.1 % (ref 36.0–46.0)
Hemoglobin: 13.2 g/dL (ref 12.0–15.0)
Immature Granulocytes: 1 %
Lymphocytes Relative: 44 %
Lymphs Abs: 7 10*3/uL — ABNORMAL HIGH (ref 0.7–4.0)
MCH: 30.1 pg (ref 26.0–34.0)
MCHC: 31.4 g/dL (ref 30.0–36.0)
MCV: 95.9 fL (ref 80.0–100.0)
Monocytes Absolute: 0.5 10*3/uL (ref 0.1–1.0)
Monocytes Relative: 3 %
Neutro Abs: 8 10*3/uL — ABNORMAL HIGH (ref 1.7–7.7)
Neutrophils Relative %: 50 %
Platelets: 325 10*3/uL (ref 150–400)
RBC: 4.39 MIL/uL (ref 3.87–5.11)
RDW: 16.1 % — ABNORMAL HIGH (ref 11.5–15.5)
WBC: 15.9 10*3/uL — ABNORMAL HIGH (ref 4.0–10.5)
nRBC: 0 % (ref 0.0–0.2)

## 2023-04-26 LAB — BASIC METABOLIC PANEL WITH GFR
Anion gap: 5 (ref 5–15)
BUN: 10 mg/dL (ref 8–23)
CO2: 25 mmol/L (ref 22–32)
Calcium: 8.3 mg/dL — ABNORMAL LOW (ref 8.9–10.3)
Chloride: 111 mmol/L (ref 98–111)
Creatinine, Ser: 0.68 mg/dL (ref 0.44–1.00)
GFR, Estimated: >60 mL/min (ref >60)
Glucose, Bld: 104 mg/dL — ABNORMAL HIGH (ref 70–99)
Potassium: 4 mmol/L (ref 3.5–5.1)
Sodium: 141 mmol/L (ref 135–145)

## 2023-04-26 LAB — LACTIC ACID, PLASMA: Lactic Acid, Venous: 6.3 mmol/L (ref 0.5–1.9)

## 2023-04-26 LAB — C DIFFICILE QUICK SCREEN W PCR REFLEX
C Diff antigen: NEGATIVE
C Diff interpretation: NOT DETECTED
C Diff toxin: NEGATIVE

## 2023-04-26 LAB — PHOSPHORUS: Phosphorus: 2.7 mg/dL (ref 2.5–4.6)

## 2023-04-26 LAB — GLUCOSE, CAPILLARY
Glucose-Capillary: 177 mg/dL — ABNORMAL HIGH (ref 70–99)
Glucose-Capillary: 191 mg/dL — ABNORMAL HIGH (ref 70–99)

## 2023-04-26 LAB — MAGNESIUM
Magnesium: 2 mg/dL (ref 1.7–2.4)
Magnesium: 2 mg/dL (ref 1.7–2.4)

## 2023-04-26 LAB — PROCALCITONIN: Procalcitonin: <0.1 ng/mL

## 2023-04-26 MED ORDER — MIRTAZAPINE 15 MG PO TABS
7.5000 mg | ORAL_TABLET | Freq: Every day | ORAL | Status: DC
  Administered 2023-04-26 – 2023-04-27 (×2): 7.5 mg via ORAL
  Filled 2023-04-26 (×2): qty 1

## 2023-04-26 MED ORDER — LACTATED RINGERS IV BOLUS
30.0000 mL/kg | Freq: Once | INTRAVENOUS | Status: AC
  Administered 2023-04-27: 1632 mL via INTRAVENOUS

## 2023-04-26 MED ORDER — IPRATROPIUM-ALBUTEROL 0.5-2.5 (3) MG/3ML IN SOLN
3.0000 mL | RESPIRATORY_TRACT | Status: DC | PRN
  Administered 2023-04-26 – 2023-05-04 (×7): 3 mL via RESPIRATORY_TRACT
  Filled 2023-04-26 (×7): qty 3

## 2023-04-26 MED ORDER — METHYLPREDNISOLONE SODIUM SUCC 125 MG IJ SOLR
125.0000 mg | Freq: Once | INTRAMUSCULAR | Status: AC
  Administered 2023-04-26: 125 mg via INTRAVENOUS
  Filled 2023-04-26: qty 2

## 2023-04-26 MED ORDER — ONDANSETRON HCL 4 MG/2ML IJ SOLN: 4.0000 mg | Freq: Once | INTRAMUSCULAR | Status: DC

## 2023-04-26 MED ORDER — ACETAMINOPHEN 10 MG/ML IV SOLN
1000.0000 mg | Freq: Once | INTRAVENOUS | Status: AC
  Administered 2023-04-27: 1000 mg via INTRAVENOUS
  Filled 2023-04-26: qty 100

## 2023-04-26 MED ORDER — ENSURE ENLIVE PO LIQD
237.0000 mL | Freq: Two times a day (BID) | ORAL | Status: DC
  Administered 2023-04-26 – 2023-04-27 (×3): 237 mL via ORAL

## 2023-04-26 MED ORDER — LACTATED RINGERS IV SOLN: INTRAVENOUS | Status: DC

## 2023-04-26 MED ORDER — METHYLPREDNISOLONE SODIUM SUCC 40 MG IJ SOLR
40.0000 mg | Freq: Two times a day (BID) | INTRAMUSCULAR | Status: DC
  Administered 2023-04-27 – 2023-05-02 (×11): 40 mg via INTRAVENOUS
  Filled 2023-04-26 (×11): qty 1

## 2023-04-26 MED ORDER — MORPHINE SULFATE (PF) 2 MG/ML IV SOLN
1.0000 mg | INTRAVENOUS | Status: AC
  Administered 2023-04-26: 1 mg via INTRAVENOUS
  Filled 2023-04-26: qty 1

## 2023-04-26 NOTE — Progress Notes (Signed)
 Lab called stating that pt's lactic acid is 6.3. Provider was notified immediately. ICU nurse also notified.

## 2023-04-26 NOTE — Progress Notes (Signed)
 PHARMACY CONSULT NOTE - FOLLOW UP  Pharmacy Consult for Electrolyte Monitoring and Replacement   Recent Labs: Potassium (mmol/L)  Date Value  04/26/2023 4.0  12/26/2012 3.3 (L)   Magnesium (mg/dL)  Date Value  16/10/9602 2.0  12/25/2012 1.8   Calcium (mg/dL)  Date Value  54/09/8117 8.3 (L)   Calcium, Total (mg/dL)  Date Value  14/78/2956 9.1   Albumin (g/dL)  Date Value  21/30/8657 2.4 (L)  03/23/2015 3.6  12/20/2012 3.5   Phosphorus (mg/dL)  Date Value  84/69/6295 2.7   Sodium (mmol/L)  Date Value  04/26/2023 141  01/01/2021 143  12/26/2012 144     Assessment: 78 yo F with PMH including CVA, Afib (on Eliquis) presenting with hypokalemia. Per patient, recently experienced diarrhea for a few days due to taking antibiotic for pneumonia. QTc on ECG this morning was 648. Pharmacy has been consulted to manage electrolytes.  Goal of Therapy:  K >4.0, Mg >2.0  Plan: No electrolyte replacement currently warranted Recheck electrolytes tomorrow morning  Bettey Costa, PharmD Clinical Pharmacist 04/26/2023 8:06 AM

## 2023-04-26 NOTE — TOC Initial Note (Signed)
 Transition of Care [facility]) - Initial/Assessment Note    Patient Details  Name: Beth [name] MRN: [id number] Date of Birth: 15-Dec-1945  Transition of Care [facility]) CM/SW Contact:    Liliana Cline, LCSW Phone Number: 04/26/2023, 4:31 PM  Clinical Narrative:                 CSW spoke with patient's son [name]) by phone. [name] confirmed that patient is a long term care resident at [facility]. He stated patient uses a walker at baseline. Explained that PT feels patient would benefit from some rehab when she returns to [facility]. [name] agreed, and stated patient is weaker due to being sick for the past month or so and not eating. [name] would like patient to get rehab when she returns to [facility] if possible and if approved by Medicare. [name] states he can transport patient back to [facility] when she is ready. CSW left a VM for Ricky in Admissions at [facility] to confirm patient can get rehab when she returns - will need insurance auth.  Expected Discharge Plan: Skilled Nursing Facility Barriers to Discharge: Continued Medical Work up   Patient Goals and CMS Choice   CMS Medicare.gov Compare Post Acute Care list provided to:: Patient Represenative (must comment) Choice offered to / list presented to : Adult Children      Expected Discharge Plan and Services       Living arrangements for the past 2 months: Skilled Nursing Facility                                      Prior Living Arrangements/Services Living arrangements for the past 2 months: Skilled Nursing Facility Lives with:: Facility Resident Patient language and need for interpreter reviewed:: Yes Do you feel safe going back to the place where you live?: Yes      Need for Family Participation in Patient Care: Yes (Comment) Care giver support system in place?: Yes (comment) Current home services: DME Criminal Activity/Legal Involvement Pertinent to Current Situation/Hospitalization: No - Comment as needed  Activities of  Daily Living   ADL Screening (condition at time of admission) Independently performs ADLs?: Yes (appropriate for developmental age) Is the patient deaf or have difficulty hearing?: No Does the patient have difficulty seeing, even when wearing glasses/contacts?: No Does the patient have difficulty concentrating, remembering, or making decisions?: No  Permission Sought/Granted Permission sought to share information with : Facility Industrial/product designer granted to share information with : Yes, Verbal Permission Granted (by son [name])     Permission granted to share info w AGENCY: [facility]        Emotional Assessment         Alcohol / Substance Use: Not Applicable Psych Involvement: No (comment)  Admission diagnosis:  Suicidal ideation [R45.851] Hypokalemia [E87.6] Acute cystitis without hematuria [N30.00] Long QT interval [R94.31] Patient Active Problem List   Diagnosis Date Noted   Acute respiratory failure with hypoxia (HCC) 04/26/2023   Hypophosphatemia 04/26/2023   Hypomagnesemia 04/25/2023   Suicidal ideation 04/25/2023   Diarrhea 04/25/2023   Other specified hypothyroidism 09/13/2022   Other emphysema (HCC) 09/12/2022   Closed fracture of left ankle 09/11/2022   Elevated liver enzymes 09/11/2022   Chronic respiratory failure with hypoxia (HCC) 09/11/2022   Pressure injury of skin 09/11/2022   Atrial fibrillation with slow ventricular response (HCC) 09/11/2022   Inadequate pain control 09/05/2022   Closed  fracture of left lateral malleolus 09/05/2022   Atrial fibrillation, chronic (HCC) 09/05/2022   Acute on chronic diastolic CHF (congestive heart failure) (HCC) 08/25/2022   Stroke (HCC) 08/25/2022   HLD (hyperlipidemia) 08/25/2022   Chronic kidney disease, stage 3a (HCC) 08/25/2022   Hypokalemia 08/25/2022   Myocardial injury 08/25/2022   Prolonged QT interval 08/25/2022   UTI (urinary tract infection) 08/25/2022   Acute left-sided weakness  03/24/2022   Depression with suicidal ideation 03/24/2022   Asthma with COPD (HCC) 03/24/2022   TIA (transient ischemic attack) 03/24/2022   Multiple falls 03/24/2022   Severe pulmonary arterial systolic hypertension (HCC) 03/24/2022   Moderate mitral regurgitation 03/24/2022   Moderate tricuspid regurgitation 03/24/2022   Chronic diastolic congestive heart failure (HCC) 11/14/2020   Rash 01/14/2020   Pulmonary hypertension (HCC) 09/14/2019   Coccydynia 07/29/2019   Cervicalgia 05/04/2019   Acute pain of left shoulder 05/04/2019   Elbow pain, left 05/04/2019   DOE (dyspnea on exertion) 03/23/2019   Leg swelling 03/22/2019    Class: Acute   Lumbar degenerative disc disease 09/10/2018   Left hip pain 09/10/2018   Post-traumatic osteoarthritis of left elbow 07/14/2018   Lumbar radiculopathy 02/12/2018   Lumbar spondylosis 02/12/2018   Lumbar facet arthropathy 02/12/2018   Atherosclerosis of aorta (HCC) 09/17/2017   Lung nodule 09/17/2017   Urinary tract infection 12/05/2016   Protein-calorie malnutrition, mild (HCC) 08/07/2016   Generalized anxiety disorder 07/15/2016   Chronic pain syndrome 07/15/2016   Severe episode of recurrent major depressive disorder, without psychotic features (HCC) 07/14/2016   Moderate benzodiazepine use disorder (HCC) 05/08/2016   Major depressive disorder, recurrent, severe w/o psychotic behavior (HCC) 05/01/2016   Seasonal allergic rhinitis 03/23/2015   Hyperglycemia 03/23/2015   Senile purpura (HCC) 03/23/2015   Perennial allergic rhinitis with seasonal variation 03/23/2015   Marital problems 10/31/2014   Migraine without aura and without status migrainosus, not intractable 10/31/2014   Myofascial pain syndrome of lumbar spine 08/09/2014   Colon polyp 08/09/2014   COPD, severe (HCC) 08/09/2014   CVA, old, hemiparesis (HCC) 08/09/2014   Dysfunction of eustachian tube 08/09/2014   Fibromyalgia syndrome 08/09/2014   Gastro-esophageal reflux  disease without esophagitis 08/09/2014   Benign migrating glossitis 08/09/2014   Cerebrovascular accident, old 08/09/2014   IBS (irritable bowel syndrome) 08/09/2014   Low back pain with radiation 08/09/2014   Chronic recurrent major depressive disorder (HCC) 08/09/2014   Dysmetabolic syndrome 08/09/2014   OP (osteoporosis) 08/09/2014   Vitamin D deficiency 08/09/2014   Smoking 08/09/2014   Benign hypertension 07/19/2013   Benign neoplasm of skin of trunk 06/03/2013   H/O: pneumonia 09/25/2012   PCP:  Smitty Cords, DO Pharmacy:   [facility] [phone] [place] [address], [place] - [address] AT [facility] OF [address] [address] [place] [address] Phone: [phone] Fax: [phone]  [facility] - [place], [place] - [address] [place] [address] Phone: [phone] Fax: [phone]     Social Drivers of Health (SDOH) Social History: SDOH Screenings   Food Insecurity: No Food Insecurity (04/25/2023)  Housing: Low Risk  (04/25/2023)  Transportation Needs: Unmet Transportation Needs (04/25/2023)  Utilities: Not At Risk (04/25/2023)  Alcohol Screen: Low Risk  (02/01/2022)  Depression (PHQ2-9): High Risk (05/13/2022)  Financial Resource Strain: Low Risk  (02/01/2022)  Physical Activity: Insufficiently Active (02/01/2022)  Social Connections: Socially Isolated (04/25/2023)  Stress: No Stress Concern Present (02/01/2022)  Tobacco Use: High Risk (04/24/2023)  Health Literacy: Adequate Health Literacy (09/16/2022)   SDOH Interventions:     Readmission Risk Interventions    04/26/2023    4:30 PM 09/09/2022   12:06 PM  Readmission Risk Prevention Plan  Transportation Screening Complete Complete  PCP or Specialist Appt within 3-5 Days Complete   HRI or Home Care Consult Complete   Social Work Consult for Recovery Care Planning/Counseling Complete   Palliative Care Screening Not Applicable   Medication Review Furniture conservator/restorer) Complete Referral to Pharmacy  PCP or Specialist appointment within 3-5 days of discharge  Complete  HRI or Home Care Consult  Complete  SW Recovery Care/Counseling Consult  Complete  Palliative Care Screening  Complete  Skilled Nursing Facility  Complete

## 2023-04-26 NOTE — [place] FL2 (Signed)
 [place] MEDICAID FL2 LEVEL OF CARE FORM     IDENTIFICATION  Patient Name: Beth [name] Birthdate: 08/14/1945 Sex: female Admission Date (Current Location): 04/25/2023  [place] and [place] Number:  Chiropodist and Address:  [facility], [address] [place], [place] [address]      Provider Number: [id number]  Attending Physician Name and Address:  Alford Highland, MD  Relative Name and Phone Number:  [name], [name] (Son)  [phone] [facility])    Current Level of Care: Hospital Recommended Level of Care: Skilled Nursing Facility Prior Approval Number:    Date Approved/Denied:   PASRR Number: [id number]  Discharge Plan:      Current Diagnoses: Patient Active Problem List   Diagnosis Date Noted   Acute respiratory failure with hypoxia (HCC) 04/26/2023   Hypophosphatemia 04/26/2023   Hypomagnesemia 04/25/2023   Suicidal ideation 04/25/2023   Diarrhea 04/25/2023   Other specified hypothyroidism 09/13/2022   Other emphysema (HCC) 09/12/2022   Closed fracture of left ankle 09/11/2022   Elevated liver enzymes 09/11/2022   Chronic respiratory failure with hypoxia (HCC) 09/11/2022   Pressure injury of skin 09/11/2022   Atrial fibrillation with slow ventricular response (HCC) 09/11/2022   Inadequate pain control 09/05/2022   Closed fracture of left lateral malleolus 09/05/2022   Atrial fibrillation, chronic (HCC) 09/05/2022   Acute on chronic diastolic CHF (congestive heart failure) (HCC) 08/25/2022   Stroke (HCC) 08/25/2022   HLD (hyperlipidemia) 08/25/2022   Chronic kidney disease, stage 3a (HCC) 08/25/2022   Hypokalemia 08/25/2022   Myocardial injury 08/25/2022   Prolonged QT interval 08/25/2022   UTI (urinary tract infection) 08/25/2022   Acute left-sided weakness 03/24/2022   Depression with suicidal ideation 03/24/2022   Asthma with COPD (HCC) 03/24/2022   TIA (transient ischemic attack) 03/24/2022   Multiple falls  03/24/2022   Severe pulmonary arterial systolic hypertension (HCC) 03/24/2022   Moderate mitral regurgitation 03/24/2022   Moderate tricuspid regurgitation 03/24/2022   Chronic diastolic congestive heart failure (HCC) 11/14/2020   Rash 01/14/2020   Pulmonary hypertension (HCC) 09/14/2019   Coccydynia 07/29/2019   Cervicalgia 05/04/2019   Acute pain of left shoulder 05/04/2019   Elbow pain, left 05/04/2019   DOE (dyspnea on exertion) 03/23/2019   Leg swelling 03/22/2019   Lumbar degenerative disc disease 09/10/2018   Left hip pain 09/10/2018   Post-traumatic osteoarthritis of left elbow 07/14/2018   Lumbar radiculopathy 02/12/2018   Lumbar spondylosis 02/12/2018   Lumbar facet arthropathy 02/12/2018   Atherosclerosis of aorta (HCC) 09/17/2017   Lung nodule 09/17/2017   Urinary tract infection 12/05/2016   Protein-calorie malnutrition, mild (HCC) 08/07/2016   Generalized anxiety disorder 07/15/2016   Chronic pain syndrome 07/15/2016   Severe episode of recurrent major depressive disorder, without psychotic features (HCC) 07/14/2016   Moderate benzodiazepine use disorder (HCC) 05/08/2016   Major depressive disorder, recurrent, severe w/o psychotic behavior (HCC) 05/01/2016   Seasonal allergic rhinitis 03/23/2015   Hyperglycemia 03/23/2015   Senile purpura (HCC) 03/23/2015   Perennial allergic rhinitis with seasonal variation 03/23/2015   Marital problems 10/31/2014   Migraine without aura and without status migrainosus, not intractable 10/31/2014   Myofascial pain syndrome of lumbar spine 08/09/2014   Colon polyp 08/09/2014   COPD, severe (HCC) 08/09/2014   CVA, old, hemiparesis (HCC) 08/09/2014   Dysfunction of eustachian tube 08/09/2014   Fibromyalgia syndrome 08/09/2014   Gastro-esophageal reflux disease without esophagitis 08/09/2014   Benign migrating glossitis 08/09/2014   Cerebrovascular accident, old 08/09/2014  IBS (irritable bowel syndrome) 08/09/2014   Low back  pain with radiation 08/09/2014   Chronic recurrent major depressive disorder (HCC) 08/09/2014   Dysmetabolic syndrome 08/09/2014   OP (osteoporosis) 08/09/2014   Vitamin D deficiency 08/09/2014   Smoking 08/09/2014   Benign hypertension 07/19/2013   Benign neoplasm of skin of trunk 06/03/2013   H/O: pneumonia 09/25/2012    Orientation RESPIRATION BLADDER Height & Weight        O2 Continent Weight: 120 lb (54.4 kg) Height:  5\' 5"  (165.1 cm)  BEHAVIORAL SYMPTOMS/MOOD NEUROLOGICAL BOWEL NUTRITION STATUS      Continent Diet (heart)  AMBULATORY STATUS COMMUNICATION OF NEEDS Skin   Limited Assist Verbally Skin abrasions, Other (Comment) (redness)                       Personal Care Assistance Level of Assistance  Bathing, Feeding, Dressing Bathing Assistance: Limited assistance Feeding assistance: Limited assistance Dressing Assistance: Limited assistance     Functional Limitations Info             SPECIAL CARE FACTORS FREQUENCY  PT (By licensed PT), OT (By licensed OT)     PT Frequency: 5 times per week OT Frequency: 5 times per week            Contractures      Additional Factors Info  Code Status, Allergies Code Status Info: full Allergies Info: Bextra  (Valdecoxib), Compazine (Prochlorperazine Edisylate), Lithium Carbonate, Lyrica (Pregabalin)           Current Medications (04/26/2023):  This is the current hospital active medication list Current Facility-Administered Medications  Medication Dose Route Frequency Provider Last Rate Last Admin   acetaminophen (TYLENOL) tablet 650 mg  650 mg Oral Q6H PRN Andris Baumann, MD       Or   acetaminophen (TYLENOL) suppository 650 mg  650 mg Rectal Q6H PRN Andris Baumann, MD       albuterol (PROVENTIL) (2.5 MG/3ML) 0.083% nebulizer solution 2.5 mg  2.5 mg Inhalation Q6H PRN Andris Baumann, MD       apixaban Everlene Balls) tablet 5 mg  5 mg Oral BID Lindajo Royal V, MD   5 mg at 04/26/23 0908   atorvastatin  (LIPITOR) tablet 40 mg  40 mg Oral QHS Lindajo Royal V, MD   40 mg at 04/25/23 2148   cefTRIAXone (ROCEPHIN) 1 g in sodium chloride 0.9 % 100 mL IVPB  1 g Intravenous Q24H Alford Highland, MD 200 mL/hr at 04/26/23 0012 1 g at 04/26/23 0012   feeding supplement (ENSURE ENLIVE / ENSURE PLUS) liquid 237 mL  237 mL Oral BID BM Wieting, Richard, MD   237 mL at 04/26/23 1357   fluticasone furoate-vilanterol (BREO ELLIPTA) 200-25 MCG/ACT 1 puff  1 puff Inhalation Daily Andris Baumann, MD   1 puff at 04/26/23 0907   And   umeclidinium bromide (INCRUSE ELLIPTA) 62.5 MCG/ACT 1 puff  1 puff Inhalation Daily Andris Baumann, MD   1 puff at 04/26/23 0907   HYDROcodone-acetaminophen (NORCO/VICODIN) 5-325 MG per tablet 1-2 tablet  1-2 tablet Oral Q4H PRN Andris Baumann, MD   1 tablet at 04/26/23 0908   levothyroxine (SYNTHROID) tablet 25 mcg  25 mcg Oral Q0600 Andris Baumann, MD   25 mcg at 04/26/23 0656   melatonin tablet 5 mg  5 mg Oral QHS Andris Baumann, MD   5 mg at 04/25/23 2147   metoprolol succinate (TOPROL-XL) 24  hr tablet 12.5 mg  12.5 mg Oral QHS Alford Highland, MD   12.5 mg at 04/25/23 2148   mirtazapine (REMERON) tablet 7.5 mg  7.5 mg Oral QHS Tingling, Stephanie, PA-C         Discharge Medications: Please see discharge summary for a list of discharge medications.  Relevant Imaging Results:  Relevant Lab Results:   Additional Information SS #: [id number]  Amayia Ciano E Kiren Mcisaac, LCSW

## 2023-04-26 NOTE — Plan of Care (Signed)

## 2023-04-26 NOTE — Progress Notes (Signed)
 Progress Note   Patient: Beth [name] WUJ:[id number] DOB: June 30, 1945 DOA: 04/25/2023     1 DOS: the patient was seen and examined on 04/26/2023   Brief hospital course: 78 y.o. female with medical history significant for CVA with left-sided residual weakness, depression, anxiety, neuropathy, insomnia, atrial fibrillation on Eliquis, COPD on home O2 at 3 L who was brought to the ED after she tried to stab herself with scissors.  She endorses feeling suicidal.  Patient recently recovered from a bout of pneumonia, completing antibiotic treatment.  She did endorse diarrhea in the few days prior.  She has had very poor oral intake. ED course and data review: BP slightly elevated on arrival at 170/72, pulse in the 50s otherwise normal vitals Labs notable for potassium of 2.2 and magnesium 1.6.  TSH slightly elevated at 5.528.  Free T4 also elevated at 1.66 Urinalysis with moderate leukocytes CBC WNL, BMP except for potassium WNL. UDS, salicylate, EtOH and acetaminophen levels within normal parameters EKG, personally reviewed and interpreted showing sinus bradycardia at 51 and prolonged QT to 648   Patient was started on ceftriaxone for possibility of UTI.  Also given oral and IV potassium repletion.   Seen by behavioral health,    Hospitalist consulted for admission.   3/28.  Potassium still low this morning IV and oral potassium ordered.  3/29.  Psychiatry changed her medications over to Remeron at night secondary to prolonged QT.  Electrolytes much improved.  Patient felt short of breath this morning.  Chest x-ray showing overall improvement in bilateral airspace opacities from prior studies.   Assessment and Plan: * Acute respiratory failure with hypoxia (HCC) Pulse ox dropped down to 83% with ambulating on room air.  Continue nebulizer treatments and inhalers.  Chest x-ray showing improvement of airspace opacities.  Procalcitonin negative.  Will hold off on further  antibiotics.  Hypophosphatemia Replaced  Hypomagnesemia Replaced into the normal range  Hypokalemia Aggressively replaced into the normal range.  UTI (urinary tract infection) Patient on Rocephin but not a lot of colonies growing out of culture.  Prolonged QT interval Avoid QT prolonging drugs Low-dose beta-blocker  Depression with suicidal ideation Involuntary commitment Patient still on one-to-one observation.  Does not have suicidal ideation today.  Psychiatry changing medications over to Remeron  Atrial fibrillation with slow ventricular response (HCC) Continue Eliquis for anticoagulation.  Holding amiodarone with prolonged QT.  Restart low-dose Toprol-XL at night.  Diarrhea Stool studies negative  Other specified hypothyroidism Both TSH and T4 elevated Will continue Synthroid 25 mcg  Asthma with COPD (HCC) Not currently exacerbated Continue home inhalers.  DuoNebs as needed  Chronic diastolic congestive heart failure (HCC) Clinically euvolemic to dry Will hold Lasix Restart low-dose Toprol-XL at night.  Pulmonary hypertension (HCC) Control blood pressure        Subjective: Patient seen this morning was feeling short of breath.  Patient states she is coughing up some greenish phlegm.  Admitted medically for electrolyte abnormalities.  Today found to be hypoxic with ambulation.  Physical Exam: Vitals:   04/26/23 0344 04/26/23 0716 04/26/23 1100 04/26/23 1105  BP:  (!) 149/62    Pulse: (!) 59 (!) 57    Resp:  16    Temp:  (!) 97.5 F (36.4 C)    TempSrc:  Oral    SpO2: 90% 92% (!) 83% 97%  Weight:      Height:       Physical Exam HENT:     Head: Normocephalic.  Mouth/Throat:     Pharynx: No oropharyngeal exudate.  Eyes:     General: Lids are normal.     Conjunctiva/sclera: Conjunctivae normal.  Cardiovascular:     Rate and Rhythm: Normal rate and regular rhythm.     Heart sounds: Normal heart sounds, S1 normal and S2 normal.  Pulmonary:      Breath sounds: Examination of the right-lower field reveals decreased breath sounds. Examination of the left-lower field reveals decreased breath sounds. Decreased breath sounds present. No wheezing, rhonchi or rales.  Abdominal:     Palpations: Abdomen is soft.     Tenderness: There is no abdominal tenderness.  Musculoskeletal:     Right lower leg: No swelling.     Left lower leg: No swelling.  Skin:    General: Skin is warm.     Findings: No rash.  Neurological:     Mental Status: She is alert.     Comments: Answers all questions appropriately.  Positive for tardive dyskinesia.     Data Reviewed: Stool studies negative, procalcitonin negative, chest x-ray shows improving aeration of bilateral airspace opacities compared to prior studies  Family Communication: Left message for son  Disposition: Status is: Inpatient Remains inpatient appropriate because: Now hypoxic with ambulation.  PT recommending rehab  Planned Discharge Destination: Rehab    Time spent: 28 minutes  Author: Alford Highland, MD 04/26/2023 2:59 PM  For on call review [email or web address].

## 2023-04-26 NOTE — Progress Notes (Signed)
 CROSS COVER NOTE  NAME: LEISL SPURRIER MRN: [id number] DOB : [birth year] ATTENDING PHYSICIAN: Alford Highland, MD    Date of Service   04/26/2023   HPI/Events of Note   Secure chat received from [facility] RN "Pt is SOB. We are rechecking her vitals. Amidst 4l oxygen she has SOB. No chest pain is reported. She is feeling slightly nauseous. She is reporting anxiety and states that she has had anxiety attacks in the past. I didn't see any anti-anxiety in her meds. her lungs are clear enough neubilizer will not help. Oxygen sat is 96-97% "  HPI Patient brought to ER after attempted suicide at group home. She has a hs of afib with slow VR , Chronic hypoxic resp failure from emphysema (o2 requiremnt 3L)., anxiety, depression, insomnia, HTN. On presentation she was found to have poss UTI and chem panel with hypokalemia and hypomagnesemia CHF, prolonged Qtc, moderate aortic valve stenosis " Patient was started on rocephin on admission for poss uti, wbc count normal and afebrile   Interventions   Assessment/Plan: Arrival to room patitn with significant increased work of breathing, rate 24 and wheezing/ course rhonchi present. She was clammy, saying help me. Nurse tech at bedside gtting cbg requested. Oxygen sats 87 on 6L [place] and endorses this is big change from how she was the previous night Nurse tech states sats were as low as 77%. Patient also endorsed nausea Temp 100.2   Latest Reference Range & Units 04/26/23 22:40  pH, Ven 7.25 - 7.43  7.19 (LL)  pCO2, Ven 44 - 60 mmHg 56  pO2, Ven 32 - 45 mmHg 60 (H)  Acid-base deficit 0.0 - 2.0 mmol/L 7.4 (H)  Bicarbonate 20.0 - 28.0 mmol/L 21.4  O2 Saturation % 86.9  Patient temperature  37.0  Collection site  VEIN  (LL): Data is critically low (H): Data is abnormally high  Chest xray  IMPRESSION: Overall improvement in bilateral airspace opacities compared with prior studies from August. Patchy pulmonary opacities bilaterally which  could reflect postinflammatory scarring or recurrent infection. No consolidation or pleural effusion.        Latest Reference Range & Units 04/26/23 22:22  Sodium 135 - 145 mmol/L 142  Potassium 3.5 - 5.1 mmol/L 4.6  Chloride 98 - 111 mmol/L 109  CO2 22 - 32 mmol/L 17 (L)  Glucose 70 - 99 mg/dL 119 (H)  BUN 8 - 23 mg/dL 13  Creatinine 1.47 - 8.29 mg/dL 5.62  Calcium 8.9 - 13.0 mg/dL 8.5 (L)  Anion gap 5 - 15  16 (H)  Magnesium 1.7 - 2.4 mg/dL 2.0  Alkaline Phosphatase 38 - 126 U/L 95  Albumin 3.5 - 5.0 g/dL 2.5 (L)  AST 15 - 41 U/L 64 (H)  ALT 0 - 44 U/L 31  Total Protein 6.5 - 8.1 g/dL 6.4 (L)  Total Bilirubin 0.0 - 1.2 mg/dL 0.4  GFR, Estimated >86 mL/min >60  (L): Data is abnormally low (H): Data is abnormally high  Lactic acid - 6.3 and procal <0.10 Sepsis with pneumonia as source 32ml/kg LR bolus f/b 100 /h  change antibiotics vanc and zosyn - pharmacy consulted for dosing Resp viral  BC x2 and ua with reflex micro/culture panel  Acute on chronic respiratory failure with hypoxia hypercapnea  BIPAP 40% 12/6 Repeat VBG at 0200  Oral care protocol As above treat pna + solumedrol for COPD exacerbation  Metabolic Anion gap acidosis  Poor oral intake, low albumin, low  BMI and evident muscle store  wasting - contributory to high lactate - IV fluids Boost TID once BIPAP weaned  Hyperglycemia Q 4h cbg with sensitive sliding scale insulin - maintain euglycemia especially with steroids HGB A1C   Patient was transferred fro medsurg floor to ICU as stepdown patient.  Patient is severely ill and high risk for decompensation  CRITICAL CARE Performed by: Manuela Schwartz NP   Total critical care time: 60 minutes  Critical care time was exclusive of separately billable procedures and treating other patients.  Critical care was necessary to treat or prevent imminent or life-threatening deterioration.  Critical care was time spent personally by me on the following  activities: development of treatment plan with patient and/or surrogate as well as nursing, discussions with consultants, evaluation of patient's response to treatment, examination of patient, obtaining history from patient or surrogate, ordering and performing treatments and interventions, ordering and review of laboratory studies, ordering and review of radiographic studies, pulse oximetry and re-evaluation of patient's condition.   Donnie Mesa NP [facility] Cross Cover 7pm-7am - check amion for availability Pager [phone]

## 2023-04-26 NOTE — Assessment & Plan Note (Signed)
 Pulse ox dropped down to 83% with ambulating on room air.  Continue nebulizer treatments and inhalers.  Chest x-ray showing improvement of airspace opacities.  Procalcitonin negative.  Will hold off on further antibiotics.

## 2023-04-26 NOTE — Progress Notes (Signed)
 At 21:14 Pt reported SOB after coming from the restroom.She denied chest pain but stated that she is experiencing anxiety. HOB was raised and oxygen was increased to 4 L. Her oxygen was hovering at 96-97%. Vitals were done. Message was sent to the provider. Sugars were tested per  providers request. Neutralizer treatment was done. Respiratory was called. Morphine and Sol-med was prescribed. Labs were done.Bi-Pap was ordered and transfer to ICU was done.
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
## 2023-04-26 NOTE — Progress Notes (Signed)
 Son was contacted to inform patients transfer and current status. Message was left to call back.

## 2023-04-26 NOTE — Progress Notes (Signed)
 Spoke with son on the phone.  He states that she has been on oxygen at the facility secondary to pulse ox dropping down when she walks around. This is likely chronic respiratory failure rather than acute respiratory failure.  Dr. Alford Highland

## 2023-04-26 NOTE — Assessment & Plan Note (Signed)
 Replaced

## 2023-04-26 NOTE — Evaluation (Signed)
 Physical Therapy Evaluation Patient Details Name: Beth [name] MRN: [id number] DOB: [birth year] Today's Date: 04/26/2023  History of Present Illness  Pt is a 78 y.o. female with medical history significant for CVA with left-sided residual weakness, depression, anxiety, neuropathy, insomnia, atrial fibrillation on Eliquis, COPD on home O2 at 3 L who was brought to the ED after she tried to stab herself with scissors.  MD assessment includes: hypokalemia, hypomagnesemia, UTI, long QT interval, depression with SI, and diarrhea.   Clinical Impression  Pt was pleasant and motivated to participate during the session and put forth good effort throughout. Pt found on room air with SpO2 in the low 90s.  Pt Ind with bed mob tasks and generally steady with transfers with a RW with good control.  After amb 15 feet SpO2 dropped to a low of 83% with good pleth with pt presenting with mod SOB.  Nursing notified and entered the room putting pt on 2LO2/min.  Pt then able to amb a self-selected max of 30 feet on 2LO2/min with SpO2 remaining >/= 90% and with only min SOB that resolved quickly upon returning to sitting.  Pt described amb distance of 30 feet as "medium" difficult and reported being quite limited in how far she can ambulate in her facility due to poor activity tolerance.  Pt will benefit from continued PT services upon discharge to safely address deficits listed in patient problem list for decreased caregiver assistance and eventual return to PLOF.           If plan is discharge home, recommend the following: A little help with walking and/or transfers;A little help with bathing/dressing/bathroom;Assistance with cooking/housework;Direct supervision/assist for medications management;Assist for transportation   Can travel by private vehicle   Yes    Equipment Recommendations None recommended by PT  Recommendations for Other Services       Functional Status Assessment Patient has had a recent  decline in their functional status and demonstrates the ability to make significant improvements in function in a reasonable and predictable amount of time.     Precautions / Restrictions Precautions Precautions: Fall Restrictions Weight Bearing Restrictions Per Provider Order: No      Mobility  Bed Mobility Overal bed mobility: Independent                  Transfers Overall transfer level: Modified independent Equipment used: Rolling walker (2 wheels)               General transfer comment: Good eccentric and concentric control and stability    Ambulation/Gait Ambulation/Gait assistance: Supervision Gait Distance (Feet): 15 feet x 1, 30 Feet x 1 Assistive device: Rolling walker (2 wheels) Gait Pattern/deviations: Step-through pattern, Decreased step length - right, Decreased step length - left, Trunk flexed Gait velocity: decreased     General Gait Details: Min verbal cues for amb closer to the RW with upright posture and within the RW during turns  Careers information officer     Tilt Bed    Modified Rankin (Stroke Patients Only)       Balance Overall balance assessment: Needs assistance   Sitting balance-Leahy Scale: Normal     Standing balance support: Bilateral upper extremity supported, During functional activity Standing balance-Leahy Scale: Good                               Pertinent  Vitals/Pain Pain Assessment Pain Assessment: No/denies pain    Home Living Family/patient expects to be discharged to:: Skilled nursing facility                   Additional Comments: Pt lives at [facility]    Prior Function Prior Level of Function : Needs assist       Physical Assist : ADLs (physical)   ADLs (physical): Bathing;Dressing Mobility Comments: Mod Ind amb limited facility distances with a rollator, uses w/c for longer facility distances, no fall history ADLs Comments: Staff assists with ADLs      Extremity/Trunk Assessment   Upper Extremity Assessment Upper Extremity Assessment: Generalized weakness    Lower Extremity Assessment Lower Extremity Assessment: Generalized weakness       Communication   Communication Communication: No apparent difficulties    Cognition Arousal: Alert Behavior During Therapy: WFL for tasks assessed/performed   PT - Cognitive impairments: No apparent impairments                         Following commands: Intact       Cueing Cueing Techniques: Verbal cues     General Comments      Exercises     Assessment/Plan    PT Assessment Patient needs continued PT services  PT Problem List Decreased strength;Decreased activity tolerance;Decreased balance;Decreased knowledge of use of DME       PT Treatment Interventions DME instruction;Gait training;Functional mobility training;Therapeutic activities;Therapeutic exercise;Balance training;Patient/family education    PT Goals (Current goals can be found in the Care Plan section)  Acute Rehab PT Goals Patient Stated Goal: To participate in more challenging activities PT Goal Formulation: With patient Time For Goal Achievement: 05/09/23 Potential to Achieve Goals: Good    Frequency Min 1X/week     Co-evaluation               AM-PAC PT "6 Clicks" Mobility  Outcome Measure Help needed turning from your back to your side while in a flat bed without using bedrails?: None Help needed moving from lying on your back to sitting on the side of a flat bed without using bedrails?: None Help needed moving to and from a bed to a chair (including a wheelchair)?: None Help needed standing up from a chair using your arms (e.g., wheelchair or bedside chair)?: None Help needed to walk in hospital room?: A Little Help needed climbing 3-5 steps with a railing? : A Little 6 Click Score: 22    End of Session Equipment Utilized During Treatment: Oxygen Activity Tolerance: Patient  tolerated treatment well Patient left: in bed;with call bell/phone within reach;with nursing/sitter in room (per sitter, no need for bed alarm) Nurse Communication: Mobility status;Other (comment) (SpO2 with amb on room air per above) PT Visit Diagnosis: Difficulty in walking, not elsewhere classified (R26.2);Muscle weakness (generalized) (M62.81)    Time: 4098-1191 PT Time Calculation (min) (ACUTE ONLY): 16 min   Charges:   PT Evaluation $PT Eval Moderate Complexity: 1 Mod   PT General Charges $$ ACUTE PT VISIT: 1 Visit       D. Elly Modena PT, DPT 04/26/23, 12:19 PM

## 2023-04-26 NOTE — Assessment & Plan Note (Signed)
Control blood pressure 

## 2023-04-27 DIAGNOSIS — J189 Pneumonia, unspecified organism: Secondary | ICD-10-CM

## 2023-04-27 DIAGNOSIS — J9621 Acute and chronic respiratory failure with hypoxia: Secondary | ICD-10-CM

## 2023-04-27 DIAGNOSIS — A419 Sepsis, unspecified organism: Secondary | ICD-10-CM

## 2023-04-27 DIAGNOSIS — E876 Hypokalemia: Secondary | ICD-10-CM | POA: Diagnosis not present

## 2023-04-27 DIAGNOSIS — E872 Acidosis, unspecified: Secondary | ICD-10-CM | POA: Diagnosis not present

## 2023-04-27 DIAGNOSIS — R6521 Severe sepsis with septic shock: Secondary | ICD-10-CM

## 2023-04-27 LAB — MAGNESIUM
Magnesium: 1.8 mg/dL (ref 1.7–2.4)
Magnesium: 2.2 mg/dL (ref 1.7–2.4)

## 2023-04-27 LAB — BLOOD GAS, ARTERIAL
Acid-Base Excess: 3.6 mmol/L — ABNORMAL HIGH (ref 0.0–2.0)
Bicarbonate: 26.3 mmol/L (ref 20.0–28.0)
O2 Content: 2 L/min
O2 Saturation: 95.7 %
Patient temperature: 37
pCO2 arterial: 33 mmHg (ref 32–48)
pH, Arterial: 7.51 — ABNORMAL HIGH (ref 7.35–7.45)
pO2, Arterial: 69 mmHg — ABNORMAL LOW (ref 83–108)

## 2023-04-27 LAB — MRSA NEXT GEN BY PCR, NASAL: MRSA by PCR Next Gen: NOT DETECTED

## 2023-04-27 LAB — URINE CULTURE: Culture: 20000 — AB

## 2023-04-27 LAB — BLOOD GAS, VENOUS: Patient temperature: 37 *Deleted

## 2023-04-27 LAB — PHOSPHORUS: Phosphorus: 4.5 mg/dL (ref 2.5–4.6)

## 2023-04-27 LAB — GLUCOSE, CAPILLARY
Glucose-Capillary: 135 mg/dL — ABNORMAL HIGH (ref 70–99)
Glucose-Capillary: 139 mg/dL — ABNORMAL HIGH (ref 70–99)
Glucose-Capillary: 144 mg/dL — ABNORMAL HIGH (ref 70–99)
Glucose-Capillary: 153 mg/dL — ABNORMAL HIGH (ref 70–99)
Glucose-Capillary: 154 mg/dL — ABNORMAL HIGH (ref 70–99)
Glucose-Capillary: 160 mg/dL — ABNORMAL HIGH (ref 70–99)

## 2023-04-27 LAB — BASIC METABOLIC PANEL WITH GFR
Anion gap: 11 (ref 5–15)
Anion gap: 8 (ref 5–15)
BUN: 13 mg/dL (ref 8–23)
BUN: 14 mg/dL (ref 8–23)
CO2: 21 mmol/L — ABNORMAL LOW (ref 22–32)
CO2: 23 mmol/L (ref 22–32)
Calcium: 8.4 mg/dL — ABNORMAL LOW (ref 8.9–10.3)
Calcium: 8.7 mg/dL — ABNORMAL LOW (ref 8.9–10.3)
Chloride: 111 mmol/L (ref 98–111)
Chloride: 109 mmol/L (ref 98–111)
Creatinine, Ser: 0.89 mg/dL (ref 0.44–1.00)
Creatinine, Ser: 0.79 mg/dL (ref 0.44–1.00)
GFR, Estimated: >60 mL/min (ref >60)
GFR, Estimated: >60 mL/min (ref >60)
Glucose, Bld: 174 mg/dL — ABNORMAL HIGH (ref 70–99)
Glucose, Bld: 134 mg/dL — ABNORMAL HIGH (ref 70–99)
Potassium: 4.3 mmol/L (ref 3.5–5.1)
Potassium: 4.5 mmol/L (ref 3.5–5.1)
Sodium: 143 mmol/L (ref 135–145)
Sodium: 140 mmol/L (ref 135–145)

## 2023-04-27 LAB — PROTIME-INR
INR: 1.7 — ABNORMAL HIGH (ref 0.8–1.2)
Prothrombin Time: 19.8 s — ABNORMAL HIGH (ref 11.4–15.2)

## 2023-04-27 LAB — HEMOGLOBIN A1C
Hgb A1c MFr Bld: 5.2 % (ref 4.8–5.6)
Mean Plasma Glucose: 102.54 mg/dL

## 2023-04-27 LAB — LACTIC ACID, PLASMA
Lactic Acid, Venous: 4.4 mmol/L (ref 0.5–1.9)
Lactic Acid, Venous: 1.4 mmol/L (ref 0.5–1.9)

## 2023-04-27 LAB — PROCALCITONIN: Procalcitonin: <0.1 ng/mL

## 2023-04-27 LAB — RESP PANEL BY RT-PCR (RSV, FLU A&B, COVID)  RVPGX2
Influenza A by PCR: NEGATIVE
Influenza B by PCR: NEGATIVE
Resp Syncytial Virus by PCR: NEGATIVE
SARS Coronavirus 2 by RT PCR: NEGATIVE

## 2023-04-27 LAB — TROPONIN I (HIGH SENSITIVITY): Troponin I (High Sensitivity): 481 ng/L (ref <18)

## 2023-04-27 MED ORDER — CHLORHEXIDINE GLUCONATE CLOTH 2 % EX PADS
6.0000 | MEDICATED_PAD | Freq: Every day | CUTANEOUS | Status: DC
  Administered 2023-04-26 – 2023-05-04 (×8): 6 via TOPICAL

## 2023-04-27 MED ORDER — MORPHINE SULFATE (PF) 2 MG/ML IV SOLN
2.0000 mg | INTRAVENOUS | Status: DC | PRN
  Administered 2023-05-04: 2 mg via INTRAVENOUS
  Filled 2023-04-27: qty 1

## 2023-04-27 MED ORDER — PIPERACILLIN-TAZOBACTAM 3.375 G IVPB
3.3750 g | Freq: Three times a day (TID) | INTRAVENOUS | Status: AC
  Administered 2023-04-27 – 2023-05-03 (×20): 3.375 g via INTRAVENOUS
  Filled 2023-04-27 (×20): qty 50

## 2023-04-27 MED ORDER — VANCOMYCIN HCL 750 MG/150ML IV SOLN: 750.0000 mg | INTRAVENOUS | Status: DC

## 2023-04-27 MED ORDER — NITROGLYCERIN 0.4 MG SL SUBL: 0.4000 mg | SUBLINGUAL_TABLET | SUBLINGUAL | Status: DC | PRN

## 2023-04-27 MED ORDER — BOOST PLUS PO LIQD
237.0000 mL | Freq: Three times a day (TID) | ORAL | Status: DC
  Filled 2023-04-27: qty 237

## 2023-04-27 MED ORDER — VANCOMYCIN HCL 1250 MG/250ML IV SOLN
1250.0000 mg | Freq: Once | INTRAVENOUS | Status: AC
  Administered 2023-04-27: 1250 mg via INTRAVENOUS
  Filled 2023-04-27: qty 250

## 2023-04-27 MED ORDER — ORAL CARE MOUTH RINSE: 15.0000 mL | OROMUCOSAL | Status: DC | PRN

## 2023-04-27 MED ORDER — ORAL CARE MOUTH RINSE
15.0000 mL | OROMUCOSAL | Status: DC
  Administered 2023-04-27 – 2023-04-29 (×25): 15 mL via OROMUCOSAL

## 2023-04-27 MED ORDER — MAGNESIUM SULFATE 2 GM/50ML IV SOLN
2.0000 g | Freq: Once | INTRAVENOUS | Status: AC
Start: 2023-04-27 — End: 2023-04-27
  Administered 2023-04-27: 2 g via INTRAVENOUS
  Filled 2023-04-27: qty 50

## 2023-04-27 MED ORDER — SERTRALINE HCL 50 MG PO TABS
50.0000 mg | ORAL_TABLET | Freq: Every day | ORAL | Status: DC
  Administered 2023-04-27: 50 mg via ORAL
  Filled 2023-04-27: qty 1

## 2023-04-27 MED ORDER — PANTOPRAZOLE SODIUM 40 MG PO TBEC
40.0000 mg | DELAYED_RELEASE_TABLET | Freq: Every day | ORAL | Status: DC
  Administered 2023-04-27: 40 mg via ORAL
  Filled 2023-04-27: qty 1

## 2023-04-27 MED ORDER — INSULIN ASPART 100 UNIT/ML IJ SOLN
0.0000 [IU] | INTRAMUSCULAR | Status: DC
  Administered 2023-04-27 (×3): 2 [IU] via SUBCUTANEOUS
  Administered 2023-04-27 – 2023-04-28 (×4): 1 [IU] via SUBCUTANEOUS
  Administered 2023-04-28: 7 [IU] via SUBCUTANEOUS
  Administered 2023-04-28: 1 [IU] via SUBCUTANEOUS
  Administered 2023-04-28: 3 [IU] via SUBCUTANEOUS
  Administered 2023-04-29 (×2): 1 [IU] via SUBCUTANEOUS
  Administered 2023-04-29: 2 [IU] via SUBCUTANEOUS
  Administered 2023-04-29 – 2023-04-30 (×3): 1 [IU] via SUBCUTANEOUS
  Administered 2023-04-30 – 2023-05-01 (×4): 2 [IU] via SUBCUTANEOUS
  Administered 2023-05-01: 1 [IU] via SUBCUTANEOUS
  Administered 2023-05-01: 2 [IU] via SUBCUTANEOUS
  Administered 2023-05-02 (×5): 1 [IU] via SUBCUTANEOUS
  Administered 2023-05-03 (×2): 2 [IU] via SUBCUTANEOUS
  Administered 2023-05-03: 1 [IU] via SUBCUTANEOUS
  Administered 2023-05-04: 2 [IU] via SUBCUTANEOUS
  Administered 2023-05-04: 1 [IU] via SUBCUTANEOUS
  Administered 2023-05-05: 2 [IU] via SUBCUTANEOUS
  Filled 2023-04-27 (×31): qty 1

## 2023-04-27 NOTE — Progress Notes (Signed)
 Made Dr. Renae Gloss aware last blood gas was done around 0150, patient requesting bipap be removed. Per MD okay to removed bipap and place on [employer]. Will continue to monitor

## 2023-04-27 NOTE — Progress Notes (Signed)
 PT Cancellation Note  Patient Details Name: JAENA BROCATO MRN: [id number] DOB: 1945/06/11   Cancelled Treatment:    Reason Eval/Treat Not Completed: Medical issues which prohibited therapy. Per protocol PT orders completed secondary to pt being transferred to a higher level of care.  Will attempt to see pt at a future date/time as medically appropriate upon receipt on new PT orders.    Ovidio Hanger PT, DPT 04/27/23, 12:01 PM

## 2023-04-27 NOTE — Assessment & Plan Note (Signed)
 Patient required BiPAP overnight secondary to acidosis and increased work of breathing.  Patient currently on nasal cannula this morning 2 L.  Son states that she has been using oxygen at the facility because she does drop down with ambulation so she does have chronic hypoxia.

## 2023-04-27 NOTE — Assessment & Plan Note (Signed)
 Could be secondary to not eating very well or lactic acidosis.

## 2023-04-27 NOTE — Plan of Care (Signed)
  Problem: Education: Goal: Knowledge of General Education information will improve Description: Including pain rating scale, medication(s)/side effects and non-pharmacologic comfort measures Outcome: Progressing   Problem: Health Behavior/Discharge Planning: Goal: Ability to manage health-related needs will improve Outcome: Progressing   Problem: Clinical Measurements: Goal: Ability to maintain clinical measurements within normal limits will improve Outcome: Progressing Goal: Will remain free from infection Outcome: Progressing Goal: Diagnostic test results will improve Outcome: Progressing Goal: Respiratory complications will improve Outcome: Progressing   Problem: Activity: Goal: Risk for activity intolerance will decrease Outcome: Progressing   Problem: Nutrition: Goal: Adequate nutrition will be maintained Outcome: Progressing   Problem: Coping: Goal: Level of anxiety will decrease Outcome: Progressing   Problem: Elimination: Goal: Will not experience complications related to bowel motility Outcome: Progressing Goal: Will not experience complications related to urinary retention Outcome: Progressing   Problem: Pain Managment: Goal: General experience of comfort will improve and/or be controlled Outcome: Progressing   Problem: Safety: Goal: Ability to remain free from injury will improve Outcome: Progressing   Problem: Skin Integrity: Goal: Risk for impaired skin integrity will decrease Outcome: Progressing   Problem: Education: Goal: Ability to describe self-care measures that may prevent or decrease complications (Diabetes Survival Skills Education) will improve Outcome: Progressing Goal: Individualized Educational Video(s) Outcome: Progressing   Problem: Coping: Goal: Ability to adjust to condition or change in health will improve Outcome: Progressing   Problem: Fluid Volume: Goal: Ability to maintain a balanced intake and output will  improve Outcome: Progressing   Problem: Health Behavior/Discharge Planning: Goal: Ability to identify and utilize available resources and services will improve Outcome: Progressing Goal: Ability to manage health-related needs will improve Outcome: Progressing   Problem: Metabolic: Goal: Ability to maintain appropriate glucose levels will improve Outcome: Progressing   Problem: Nutritional: Goal: Maintenance of adequate nutrition will improve Outcome: Progressing Goal: Progress toward achieving an optimal weight will improve Outcome: Progressing   Problem: Skin Integrity: Goal: Risk for impaired skin integrity will decrease Outcome: Progressing   Problem: Tissue Perfusion: Goal: Adequacy of tissue perfusion will improve Outcome: Progressing

## 2023-04-27 NOTE — Assessment & Plan Note (Signed)
 Multifocal pneumonia, tachypnea, leukocytosis and elevated lactic acid.  IV fluid hydration.  Initially started on vancomycin and Zosyn.  With MRSA PCR being negative we will get rid of vancomycin.  Sputum culture if able.

## 2023-04-27 NOTE — Progress Notes (Signed)
 Progress Note   Patient: Beth [name] ZOX:[id number] DOB: 1945-03-22 DOA: 04/25/2023     2 DOS: the patient was seen and examined on 04/27/2023   Brief hospital course: 78 y.o. female with medical history significant for CVA with left-sided residual weakness, depression, anxiety, neuropathy, insomnia, atrial fibrillation on Eliquis, COPD on home O2 at 3 L who was brought to the ED after she tried to stab herself with scissors.  She endorses feeling suicidal.  Patient recently recovered from a bout of pneumonia, completing antibiotic treatment.  She did endorse diarrhea in the few days prior.  She has had very poor oral intake. ED course and data review: BP slightly elevated on arrival at 170/72, pulse in the 50s otherwise normal vitals Labs notable for potassium of 2.2 and magnesium 1.6.  TSH slightly elevated at 5.528.  Free T4 also elevated at 1.66 Urinalysis with moderate leukocytes CBC WNL, BMP except for potassium WNL. UDS, salicylate, EtOH and acetaminophen levels within normal parameters EKG, personally reviewed and interpreted showing sinus bradycardia at 51 and prolonged QT to 648   Patient was started on ceftriaxone for possibility of UTI.  Also given oral and IV potassium repletion.   Seen by behavioral health,    Hospitalist consulted for admission.   3/28.  Potassium still low this morning IV and oral potassium ordered.  3/29.  Psychiatry changed her medications over to Remeron at night secondary to prolonged QT.  Electrolytes much improved.  Patient felt short of breath this morning.  Chest x-ray showing overall improvement in bilateral airspace opacities from prior studies.  Patient had worsening shortness of breath and was acidotic and sent to the ICU for BiPAP and started on aggressive antibiotics and steroids.  3/30.  Patient did not like the BiPAP mask.  Placed back on nasal cannula.  On IV fluids.  Continue Zosyn.  Discontinue vancomycin with MRSA PCR being negative.   Continue Solu-Medrol.   Assessment and Plan: * Acute on chronic respiratory failure with hypoxia and hypercapnia (HCC) Patient required BiPAP overnight secondary to acidosis and increased work of breathing.  Patient currently on nasal cannula this morning 2 L.  Son states that she has been using oxygen at the facility because she does drop down with ambulation so she does have chronic hypoxia.  Septic shock (HCC) Multifocal pneumonia, tachypnea, leukocytosis and elevated lactic acid.  IV fluid hydration.  Initially started on vancomycin and Zosyn.  With MRSA PCR being negative we will get rid of vancomycin.  Sputum culture if able.  Metabolic acidosis Could be secondary to not eating very well or lactic acidosis.  Hypophosphatemia Replaced  Hypomagnesemia Replaced  Hypokalemia Replaced  UTI (urinary tract infection) Not a lot of colonies came out of the culture.  Prolonged QT interval Avoid QT prolonging drugs Low-dose beta-blocker EKG pending from this morning  Depression with suicidal ideation Involuntary commitment Patient still on one-to-one observation.  Psychiatry changing medications over to Remeron with prolonged QT.  Atrial fibrillation with slow ventricular response (HCC) Continue Eliquis for anticoagulation.  Holding amiodarone with prolonged QT.  low-dose Toprol-XL at night.  Diarrhea Stool studies negative  Other specified hypothyroidism Both TSH and T4 elevated Will continue Synthroid 25 mcg  Asthma with COPD (HCC) Not currently exacerbated Continue home inhalers.  DuoNebs as needed  Chronic diastolic congestive heart failure (HCC) On low-dose Toprol.  Watch with IV fluid hydration.  Pulmonary hypertension (HCC) Control blood pressure        Subjective: Patient had worsening  shortness of breath overnight.  Feeling a little better this morning.  Some cough and wheeze.  Physical Exam: Vitals:   04/27/23 0802 04/27/23 0826 04/27/23 0900  04/27/23 1000  BP: (!) 102/57  (!) 109/59 (!) 112/56  Pulse: (!) 56 (!) 59 (!) 58 (!) 58  Resp: (!) 24 (!) 24 17 16   Temp: 97.9 F (36.6 C)     TempSrc: Axillary     SpO2:  100% 94% 95%  Weight:      Height:       Physical Exam HENT:     Head: Normocephalic.     Mouth/Throat:     Pharynx: No oropharyngeal exudate.  Eyes:     General: Lids are normal.     Conjunctiva/sclera: Conjunctivae normal.  Cardiovascular:     Rate and Rhythm: Normal rate and regular rhythm.     Heart sounds: Normal heart sounds, S1 normal and S2 normal.  Pulmonary:     Breath sounds: Examination of the right-lower field reveals decreased breath sounds. Examination of the left-lower field reveals decreased breath sounds. Decreased breath sounds present. No wheezing, rhonchi or rales.  Abdominal:     Palpations: Abdomen is soft.     Tenderness: There is no abdominal tenderness.  Musculoskeletal:     Right lower leg: No swelling.     Left lower leg: No swelling.  Skin:    General: Skin is warm.     Findings: No rash.  Neurological:     Mental Status: She is alert.     Comments: Answers all questions appropriately.  Positive for tardive dyskinesia.     Data Reviewed: Latest vBG shows a pH of 7.23, pCO2 63 Creatinine 0.89, CO2 21, lactic acid 4.4 last white count 15.9, hemoglobin 13.2, platelet count 325  Family Communication: Spoke with son at the bedside  Disposition: Status is: Inpatient Remains inpatient appropriate because: Transferred to ICU overnight and started on aggressive antibiotics and steroids.  Planned Discharge Destination: Skilled nursing facility    Time spent: 28 minutes  Author: Alford Highland, MD 04/27/2023 10:50 AM  For on call review [email or web address].

## 2023-04-27 NOTE — Plan of Care (Signed)
  Problem: Education: Goal: Knowledge of General Education information will improve Description: Including pain rating scale, medication(s)/side effects and non-pharmacologic comfort measures Outcome: Progressing   Problem: Health Behavior/Discharge Planning: Goal: Ability to manage health-related needs will improve Outcome: Progressing   Problem: Clinical Measurements: Goal: Ability to maintain clinical measurements within normal limits will improve Outcome: Progressing Goal: Will remain free from infection Outcome: Progressing Goal: Diagnostic test results will improve Outcome: Progressing Goal: Respiratory complications will improve Outcome: Progressing   Problem: Activity: Goal: Risk for activity intolerance will decrease Outcome: Progressing   Problem: Coping: Goal: Level of anxiety will decrease Outcome: Progressing   Problem: Elimination: Goal: Will not experience complications related to bowel motility Outcome: Progressing Goal: Will not experience complications related to urinary retention Outcome: Progressing   Problem: Pain Managment: Goal: General experience of comfort will improve and/or be controlled Outcome: Progressing   Problem: Safety: Goal: Ability to remain free from injury will improve Outcome: Progressing   Problem: Skin Integrity: Goal: Risk for impaired skin integrity will decrease Outcome: Progressing   Problem: Coping: Goal: Ability to adjust to condition or change in health will improve Outcome: Progressing   Problem: Metabolic: Goal: Ability to maintain appropriate glucose levels will improve Outcome: Progressing   Problem: Skin Integrity: Goal: Risk for impaired skin integrity will decrease Outcome: Progressing   Problem: Nutrition: Goal: Adequate nutrition will be maintained Outcome: Not Progressing   Problem: Nutritional: Goal: Maintenance of adequate nutrition will improve Outcome: Not Progressing Goal: Progress toward  achieving an optimal weight will improve Outcome: Not Progressing   Problem: Clinical Measurements: Goal: Cardiovascular complication will be avoided Outcome: Not Applicable

## 2023-04-27 NOTE — Progress Notes (Signed)
 Per Dr. Renae Gloss okay to leave BiPAP off and increase o2 to 4l and continue to monitor

## 2023-04-27 NOTE — Progress Notes (Signed)
 Pharmacy Antibiotic Note  Beth [name] is a 78 y.o. female admitted on 04/25/2023 with sepsis.  Pharmacy has been consulted for Vancomycin, Zosyn dosing.  Plan: Zosyn 3.375g IV q8h (4 hour infusion).  Vancomycin 1250 mg IV X 1 loading dose ordered for 3/30 @ ~ 0000. Vancomycin 750 mg IV Q24H ordered to start on 3/31 @ 0000.  AUC = 471.1 Vanc trough = 12.1   Height: 5\' 5"  (165.1 cm) Weight: 54.4 kg (120 lb) IBW/kg (Calculated) : 57  Temp (24hrs), Avg:98.7 F (37.1 C), Min:97.5 F (36.4 C), Max:100.3 F (37.9 C)  Recent Labs  Lab 04/24/23 2321 04/25/23 0505 04/25/23 1534 04/26/23 0504 04/26/23 2222  WBC 9.4  --   --   --  15.9*  CREATININE 0.90 0.84 0.97 0.68 0.94  LATICACIDVEN  --   --   --   --  6.3*    Estimated Creatinine Clearance: 41.7 mL/min (by C-G formula based on SCr of 0.94 mg/dL).    Allergies  Allergen Reactions   Bextra  [Valdecoxib]    Compazine [Prochlorperazine Edisylate]     Stroke-like symptoms   Lithium Carbonate     Leg weakness   Lyrica [Pregabalin]     Antimicrobials this admission:   >>    >>   Dose adjustments this admission:   Microbiology results:  BCx:   UCx:    Sputum:    MRSA PCR:   Thank you for allowing pharmacy to be a part of this patient's care.  Srijan Givan D 04/27/2023 12:08 AM

## 2023-04-27 NOTE — Progress Notes (Signed)
 PHARMACY CONSULT NOTE - FOLLOW UP  Pharmacy Consult for Electrolyte Monitoring and Replacement   Recent Labs: Potassium (mmol/L)  Date Value  04/27/2023 4.3  12/26/2012 3.3 (L)   Magnesium (mg/dL)  Date Value  16/10/9602 1.8  12/25/2012 1.8   Calcium (mg/dL)  Date Value  54/09/8117 8.4 (L)   Calcium, Total (mg/dL)  Date Value  14/78/2956 9.1   Albumin (g/dL)  Date Value  21/30/8657 2.5 (L)  03/23/2015 3.6  12/20/2012 3.5   Phosphorus (mg/dL)  Date Value  84/69/6295 4.5   Sodium (mmol/L)  Date Value  04/27/2023 143  01/01/2021 143  12/26/2012 144     Assessment: 78 yo F with PMH including CVA, Afib (on Eliquis) presenting with hypokalemia. Per patient, recently experienced diarrhea for a few days due to taking antibiotic for pneumonia. QTc on ECG this morning was 648. Pharmacy has been consulted to manage electrolytes.  Goal of Therapy:  K >4.0, Mg >2.0  Plan: Mg 2g IV x 1.  F/u with AM labs.   Ronnald Ramp, PharmD Clinical Pharmacist 04/27/2023 7:36 AM

## 2023-04-27 NOTE — Plan of Care (Incomplete)
      Significant event CROSS COVER NOTE  NAME: Beth [name] MRN: [id number] DOB : 12-Feb-1945    Concern as stated by nurse / staff   Paged due to patient complaining of chest pain x 10 minutes     Pertinent findings on chart review: 78 year old female came in with suicidal ideation.  Found to have electrolyte abnormalities.  Electrolytes replaced now with shortness of breath.  Continue nebulizer treatments and inhalers.  Pulse ox dropped down to 83% with ambulation.  Transferred to icu with worsening resp status.  On zosyn, solumedrol  Physical exam    04/27/2023    8:30 PM 04/27/2023    8:00 PM 04/27/2023    7:00 PM  Vitals with BMI  Systolic  124 119  Diastolic  62 65  Pulse 64 67 54   Physical Exam Vitals and nursing note reviewed.  Constitutional:      General: She is sleeping. She is not in acute distress.    Comments: Patient resting comfortably.  Nurse at bedside  HENT:     Head: Normocephalic and atraumatic.  Cardiovascular:     Rate and Rhythm: Normal rate and regular rhythm.     Heart sounds: Normal heart sounds.  Pulmonary:     Effort: Pulmonary effort is normal.     Breath sounds: Normal breath sounds.  Abdominal:     Palpations: Abdomen is soft.     Tenderness: There is no abdominal tenderness.  Neurological:     Mental Status: She is easily aroused. Mental status is at baseline.   Cardiac Panel (last 3 results) Recent Labs    04/27/23 2123 04/27/23 2347  TROPONINIHS 481* 449*      Assessment and  Interventions   Assessment: -Chest pain with elevated troponin, downtrending, suspecting demand ischemia from acute medical illness versus ACS -Other cardiac conditions include A-fib, on Eliquis HTN, moderate aortic valve stenosis, HFpEF and prolonged QT interval  Plan: Nitroglycerin sublingual as needed chest pain with morphine for breakthrough Continue to monitor Echo in the a.m. to evaluate for [facility] Consider cardiology consult in the a.m.       CRITICAL CARE Performed by: Andris Baumann   Total critical care time: 60 minutes  Critical care time was exclusive of separately billable procedures and treating other patients.  Critical care was necessary to treat or prevent imminent or life-threatening deterioration.  Critical care was time spent personally by me on the following activities: development of treatment plan with patient and/or surrogate as well as nursing, discussions with consultants, evaluation of patient's response to treatment, examination of patient, obtaining history from patient or surrogate, ordering and performing treatments and interventions, ordering and review of laboratory studies, ordering and review of radiographic studies, pulse oximetry and re-evaluation of patient's condition.

## 2023-04-28 ENCOUNTER — Inpatient Hospital Stay

## 2023-04-28 ENCOUNTER — Inpatient Hospital Stay (HOSPITAL_COMMUNITY)
Admit: 2023-04-28 | Discharge: 2023-04-28 | Disposition: A | Discharge status: Home / Self Care | Attending: Internal Medicine | Admitting: Internal Medicine

## 2023-04-28 DIAGNOSIS — I509 Heart failure, unspecified: Secondary | ICD-10-CM

## 2023-04-28 DIAGNOSIS — R072 Precordial pain: Secondary | ICD-10-CM

## 2023-04-28 DIAGNOSIS — J9622 Acute and chronic respiratory failure with hypercapnia: Secondary | ICD-10-CM

## 2023-04-28 DIAGNOSIS — J441 Chronic obstructive pulmonary disease with (acute) exacerbation: Secondary | ICD-10-CM

## 2023-04-28 DIAGNOSIS — R7989 Other specified abnormal findings of blood chemistry: Secondary | ICD-10-CM | POA: Diagnosis not present

## 2023-04-28 DIAGNOSIS — R0603 Acute respiratory distress: Secondary | ICD-10-CM | POA: Diagnosis not present

## 2023-04-28 DIAGNOSIS — J810 Acute pulmonary edema: Secondary | ICD-10-CM | POA: Diagnosis not present

## 2023-04-28 DIAGNOSIS — I5033 Acute on chronic diastolic (congestive) heart failure: Secondary | ICD-10-CM

## 2023-04-28 DIAGNOSIS — E43 Unspecified severe protein-calorie malnutrition: Secondary | ICD-10-CM | POA: Insufficient documentation

## 2023-04-28 DIAGNOSIS — J9621 Acute and chronic respiratory failure with hypoxia: Secondary | ICD-10-CM

## 2023-04-28 DIAGNOSIS — R7889 Finding of other specified substances, not normally found in blood: Secondary | ICD-10-CM

## 2023-04-28 DIAGNOSIS — J189 Pneumonia, unspecified organism: Secondary | ICD-10-CM

## 2023-04-28 DIAGNOSIS — R6520 Severe sepsis without septic shock: Secondary | ICD-10-CM

## 2023-04-28 DIAGNOSIS — A419 Sepsis, unspecified organism: Secondary | ICD-10-CM

## 2023-04-28 LAB — BLOOD GAS, ARTERIAL
Acid-Base Excess: 2.4 mmol/L — ABNORMAL HIGH (ref 0.0–2.0)
Acid-base deficit: 3 mmol/L — ABNORMAL HIGH (ref 0.0–2.0)
Bicarbonate: 24.4 mmol/L (ref 20.0–28.0)
Bicarbonate: 27.2 mmol/L (ref 20.0–28.0)
Drawn by: 30136
FIO2: 60 %
FIO2: 0.6 %
MECHVT: 400 mL
MECHVT: 400 mL
O2 Saturation: 95.5 %
O2 Saturation: 99.2 %
PEEP: 5 cmH2O
PEEP: 8 cmH2O
Patient temperature: 36.3
Patient temperature: 37
RATE: 20 {breaths}/min
pCO2 arterial: 51 mmHg — ABNORMAL HIGH (ref 32–48)
pCO2 arterial: 42 mmHg (ref 32–48)
pH, Arterial: 7.29 — ABNORMAL LOW (ref 7.35–7.45)
pH, Arterial: 7.42 (ref 7.35–7.45)
pO2, Arterial: 74 mmHg — ABNORMAL LOW (ref 83–108)
pO2, Arterial: 162 mmHg — ABNORMAL HIGH (ref 83–108)

## 2023-04-28 LAB — BASIC METABOLIC PANEL WITH GFR
Anion gap: 8 (ref 5–15)
Anion gap: 9 (ref 5–15)
BUN: 14 mg/dL (ref 8–23)
BUN: 16 mg/dL (ref 8–23)
CO2: 26 mmol/L (ref 22–32)
CO2: 24 mmol/L (ref 22–32)
Calcium: 9 mg/dL (ref 8.9–10.3)
Calcium: 8.5 mg/dL — ABNORMAL LOW (ref 8.9–10.3)
Chloride: 107 mmol/L (ref 98–111)
Chloride: 107 mmol/L (ref 98–111)
Creatinine, Ser: 0.72 mg/dL (ref 0.44–1.00)
Creatinine, Ser: 0.9 mg/dL (ref 0.44–1.00)
GFR, Estimated: >60 mL/min (ref >60)
GFR, Estimated: >60 mL/min (ref >60)
Glucose, Bld: 139 mg/dL — ABNORMAL HIGH (ref 70–99)
Glucose, Bld: 155 mg/dL — ABNORMAL HIGH (ref 70–99)
Potassium: 4.8 mmol/L (ref 3.5–5.1)
Potassium: 3.6 mmol/L (ref 3.5–5.1)
Sodium: 141 mmol/L (ref 135–145)
Sodium: 140 mmol/L (ref 135–145)

## 2023-04-28 LAB — GLUCOSE, CAPILLARY
Glucose-Capillary: 136 mg/dL — ABNORMAL HIGH (ref 70–99)
Glucose-Capillary: 311 mg/dL — ABNORMAL HIGH (ref 70–99)
Glucose-Capillary: 201 mg/dL — ABNORMAL HIGH (ref 70–99)
Glucose-Capillary: 108 mg/dL — ABNORMAL HIGH (ref 70–99)
Glucose-Capillary: 143 mg/dL — ABNORMAL HIGH (ref 70–99)
Glucose-Capillary: 156 mg/dL — ABNORMAL HIGH (ref 70–99)

## 2023-04-28 LAB — URINALYSIS, ROUTINE W REFLEX MICROSCOPIC
Bacteria, UA: NONE SEEN
Bilirubin Urine: NEGATIVE
Glucose, UA: NEGATIVE mg/dL
Hgb urine dipstick: NEGATIVE
Ketones, ur: NEGATIVE mg/dL
Nitrite: NEGATIVE
Protein, ur: 30 mg/dL — AB
Specific Gravity, Urine: 1.026 (ref 1.005–1.030)
pH: 5 (ref 5.0–8.0)

## 2023-04-28 LAB — CBC
HCT: 29.5 % — ABNORMAL LOW (ref 36.0–46.0)
Hemoglobin: 9.6 g/dL — ABNORMAL LOW (ref 12.0–15.0)
MCH: 30.2 pg (ref 26.0–34.0)
MCHC: 32.5 g/dL (ref 30.0–36.0)
MCV: 92.8 fL (ref 80.0–100.0)
Platelets: 176 10*3/uL (ref 150–400)
RBC: 3.18 MIL/uL — ABNORMAL LOW (ref 3.87–5.11)
RDW: 16.1 % — ABNORMAL HIGH (ref 11.5–15.5)
WBC: 15.8 10*3/uL — ABNORMAL HIGH (ref 4.0–10.5)
nRBC: 0 % (ref 0.0–0.2)

## 2023-04-28 LAB — RESPIRATORY PANEL BY PCR

## 2023-04-28 LAB — TROPONIN I (HIGH SENSITIVITY)
Troponin I (High Sensitivity): 449 ng/L (ref <18)
Troponin I (High Sensitivity): 664 ng/L (ref <18)

## 2023-04-28 LAB — MAGNESIUM
Magnesium: 2.2 mg/dL (ref 1.7–2.4)
Magnesium: 2.6 mg/dL — ABNORMAL HIGH (ref 1.7–2.4)

## 2023-04-28 LAB — ECHOCARDIOGRAM COMPLETE
AR max vel: 2.21 cm2
AV Area VTI: 2.04 cm2
AV Area mean vel: 2.11 cm2
AV Mean grad: 4 mmHg
AV Peak grad: 8 mmHg
Ao pk vel: 1.41 m/s
Area-P 1/2: 5.54 cm2
Calc EF: 57.2 %
MV M vel: 6.04 m/s
MV Peak grad: 145.9 mmHg
MV VTI: 1.91 cm2
Radius: 0.6 cm
S' Lateral: 3.3 cm
Single Plane A2C EF: 61.5 %
Single Plane A4C EF: 56.3 %

## 2023-04-28 LAB — BRAIN NATRIURETIC PEPTIDE: B Natriuretic Peptide: 1612 pg/mL — ABNORMAL HIGH (ref 0.0–100.0)

## 2023-04-28 LAB — STREP PNEUMONIAE URINARY ANTIGEN: Strep Pneumo Urinary Antigen: NEGATIVE

## 2023-04-28 LAB — PHOSPHORUS: Phosphorus: 3 mg/dL (ref 2.5–4.6)

## 2023-04-28 MED ORDER — AMIODARONE IV BOLUS ONLY 150 MG/100ML
INTRAVENOUS | Status: AC
  Administered 2023-04-28: 150 mg
  Filled 2023-04-28: qty 100

## 2023-04-28 MED ORDER — FUROSEMIDE 10 MG/ML IJ SOLN
20.0000 mg | Freq: Once | INTRAMUSCULAR | Status: AC
  Administered 2023-04-28: 20 mg via INTRAVENOUS
  Filled 2023-04-28: qty 2

## 2023-04-28 MED ORDER — MAGNESIUM SULFATE 2 GM/50ML IV SOLN
2.0000 g | Freq: Once | INTRAVENOUS | Status: AC
Start: 2023-04-28 — End: 2023-04-28
  Administered 2023-04-28: 2 g via INTRAVENOUS
  Filled 2023-04-28: qty 50

## 2023-04-28 MED ORDER — MELATONIN 5 MG PO TABS
5.0000 mg | ORAL_TABLET | Freq: Every day | ORAL | Status: DC
  Administered 2023-04-28 – 2023-04-29 (×2): 5 mg
  Filled 2023-04-28 (×2): qty 1

## 2023-04-28 MED ORDER — PROPOFOL 1000 MG/100ML IV EMUL
0.0000 ug/kg/min | INTRAVENOUS | Status: DC
  Administered 2023-04-28: 35 ug/kg/min via INTRAVENOUS
  Filled 2023-04-28: qty 100

## 2023-04-28 MED ORDER — METOPROLOL TARTRATE 25 MG PO TABS: 12.5000 mg | ORAL_TABLET | Freq: Every day | ORAL | Status: DC

## 2023-04-28 MED ORDER — METOPROLOL TARTRATE 25 MG PO TABS
12.5000 mg | ORAL_TABLET | Freq: Two times a day (BID) | ORAL | Status: DC
  Filled 2023-04-28: qty 1

## 2023-04-28 MED ORDER — SODIUM CHLORIDE 0.9 % IV SOLN: 250.0000 mL | INTRAVENOUS | Status: AC

## 2023-04-28 MED ORDER — DOCUSATE SODIUM 50 MG/5ML PO LIQD
100.0000 mg | Freq: Two times a day (BID) | ORAL | Status: DC
  Administered 2023-04-28 – 2023-04-30 (×3): 100 mg
  Filled 2023-04-28 (×3): qty 10

## 2023-04-28 MED ORDER — FUROSEMIDE 10 MG/ML IJ SOLN
40.0000 mg | Freq: Once | INTRAMUSCULAR | Status: AC
  Administered 2023-04-28: 40 mg via INTRAVENOUS
  Filled 2023-04-28: qty 4

## 2023-04-28 MED ORDER — NOREPINEPHRINE 4 MG/250ML-% IV SOLN
2.0000 ug/min | INTRAVENOUS | Status: DC
  Administered 2023-04-28: 5 ug/min via INTRAVENOUS
  Administered 2023-04-28: 2 ug/min via INTRAVENOUS
  Filled 2023-04-28 (×2): qty 250

## 2023-04-28 MED ORDER — AMIODARONE HCL IN DEXTROSE 360-4.14 MG/200ML-% IV SOLN: 30.0000 mg/h | INTRAVENOUS | Status: DC

## 2023-04-28 MED ORDER — AMIODARONE LOAD VIA INFUSION
150.0000 mg | Freq: Once | INTRAVENOUS | Status: AC
  Administered 2023-04-28: 150 mg via INTRAVENOUS
  Filled 2023-04-28: qty 83.34

## 2023-04-28 MED ORDER — BOOST PLUS PO LIQD
237.0000 mL | Freq: Three times a day (TID) | ORAL | Status: DC
  Filled 2023-04-28: qty 237

## 2023-04-28 MED ORDER — AMIODARONE HCL IN DEXTROSE 360-4.14 MG/200ML-% IV SOLN
60.0000 mg/h | INTRAVENOUS | Status: AC
  Administered 2023-04-28: 60 mg/h via INTRAVENOUS
  Filled 2023-04-28: qty 200

## 2023-04-28 MED ORDER — MIRTAZAPINE 15 MG PO TABS
7.5000 mg | ORAL_TABLET | Freq: Every day | ORAL | Status: DC
  Administered 2023-04-28 – 2023-04-29 (×2): 7.5 mg
  Filled 2023-04-28 (×2): qty 1

## 2023-04-28 MED ORDER — POLYETHYLENE GLYCOL 3350 17 G PO PACK
17.0000 g | PACK | Freq: Every day | ORAL | Status: DC
  Administered 2023-04-30: 17 g
  Filled 2023-04-28 (×2): qty 1

## 2023-04-28 MED ORDER — PANTOPRAZOLE SODIUM 40 MG IV SOLR
40.0000 mg | Freq: Every day | INTRAVENOUS | Status: DC
  Administered 2023-04-28 – 2023-05-04 (×7): 40 mg via INTRAVENOUS
  Filled 2023-04-28 (×7): qty 10

## 2023-04-28 MED ORDER — ENSURE ENLIVE PO LIQD: 237.0000 mL | Freq: Two times a day (BID) | ORAL | Status: DC

## 2023-04-28 MED ORDER — SERTRALINE HCL 50 MG PO TABS: 50.0000 mg | ORAL_TABLET | Freq: Every day | ORAL | Status: DC

## 2023-04-28 MED ORDER — FENTANYL CITRATE PF 50 MCG/ML IJ SOSY
25.0000 ug | PREFILLED_SYRINGE | INTRAMUSCULAR | Status: DC | PRN
  Administered 2023-04-28 – 2023-04-29 (×2): 25 ug via INTRAVENOUS
  Filled 2023-04-28 (×2): qty 1

## 2023-04-28 MED ORDER — ROCURONIUM BROMIDE 10 MG/ML (PF) SYRINGE
PREFILLED_SYRINGE | INTRAVENOUS | Status: AC
  Administered 2023-04-28: 54.4 mg via INTRAVENOUS
  Filled 2023-04-28: qty 10

## 2023-04-28 MED ORDER — FENTANYL CITRATE PF 50 MCG/ML IJ SOSY
25.0000 ug | PREFILLED_SYRINGE | INTRAMUSCULAR | Status: DC | PRN
  Administered 2023-04-28 (×3): 50 ug via INTRAVENOUS
  Administered 2023-04-29: 75 ug via INTRAVENOUS
  Administered 2023-04-29: 50 ug via INTRAVENOUS
  Filled 2023-04-28 (×4): qty 1
  Filled 2023-04-28: qty 2

## 2023-04-28 MED ORDER — ROCURONIUM BROMIDE 10 MG/ML (PF) SYRINGE: 1.0000 mg/kg | PREFILLED_SYRINGE | Freq: Once | INTRAVENOUS | Status: AC

## 2023-04-28 MED ORDER — PROPOFOL 1000 MG/100ML IV EMUL
INTRAVENOUS | Status: AC
  Administered 2023-04-28: 5 ug/kg/min via INTRAVENOUS
  Filled 2023-04-28: qty 100

## 2023-04-28 MED ORDER — APIXABAN 5 MG PO TABS
5.0000 mg | ORAL_TABLET | Freq: Two times a day (BID) | ORAL | Status: DC
  Administered 2023-04-28 – 2023-04-30 (×4): 5 mg
  Filled 2023-04-28 (×4): qty 1

## 2023-04-28 MED ORDER — ATORVASTATIN CALCIUM 20 MG PO TABS
40.0000 mg | ORAL_TABLET | Freq: Every day | ORAL | Status: DC
  Administered 2023-04-28 – 2023-04-29 (×2): 40 mg
  Filled 2023-04-28 (×2): qty 2

## 2023-04-28 MED ORDER — LEVOTHYROXINE SODIUM 50 MCG PO TABS: 25.0000 ug | ORAL_TABLET | Freq: Every day | ORAL | Status: DC

## 2023-04-28 MED ORDER — LEVOTHYROXINE SODIUM 25 MCG PO TABS
12.5000 ug | ORAL_TABLET | Freq: Every day | ORAL | Status: DC
  Administered 2023-04-29 – 2023-04-30 (×2): 12.5 ug
  Filled 2023-04-28 (×2): qty 0.5

## 2023-04-28 MED ORDER — ETOMIDATE 2 MG/ML IV SOLN
INTRAVENOUS | Status: AC
  Administered 2023-04-28: 20 mg via INTRAVENOUS
  Filled 2023-04-28: qty 20

## 2023-04-28 MED ORDER — ETOMIDATE 2 MG/ML IV SOLN: 20.0000 mg | Freq: Once | INTRAVENOUS | Status: AC

## 2023-04-28 NOTE — Plan of Care (Signed)
  Problem: Education: Goal: Knowledge of General Education information will improve Description: Including pain rating scale, medication(s)/side effects and non-pharmacologic comfort measures Outcome: Not Progressing   Problem: Health Behavior/Discharge Planning: Goal: Ability to manage health-related needs will improve Outcome: Not Progressing   Problem: Clinical Measurements: Goal: Ability to maintain clinical measurements within normal limits will improve Outcome: Not Progressing Goal: Will remain free from infection Outcome: Not Progressing Goal: Diagnostic test results will improve Outcome: Not Progressing Goal: Respiratory complications will improve Outcome: Not Progressing   Problem: Activity: Goal: Risk for activity intolerance will decrease Outcome: Not Progressing   Problem: Nutrition: Goal: Adequate nutrition will be maintained Outcome: Not Progressing   Problem: Coping: Goal: Level of anxiety will decrease Outcome: Not Progressing   Problem: Elimination: Goal: Will not experience complications related to bowel motility Outcome: Not Progressing Goal: Will not experience complications related to urinary retention Outcome: Not Progressing   Problem: Pain Managment: Goal: General experience of comfort will improve and/or be controlled Outcome: Not Progressing   Problem: Safety: Goal: Ability to remain free from injury will improve Outcome: Not Progressing   Problem: Skin Integrity: Goal: Risk for impaired skin integrity will decrease Outcome: Not Progressing   Problem: Education: Goal: Ability to describe self-care measures that may prevent or decrease complications (Diabetes Survival Skills Education) will improve Outcome: Not Progressing Goal: Individualized Educational Video(s) Outcome: Not Progressing   Problem: Coping: Goal: Ability to adjust to condition or change in health will improve Outcome: Not Progressing   Problem: Fluid Volume: Goal:  Ability to maintain a balanced intake and output will improve Outcome: Not Progressing   Problem: Health Behavior/Discharge Planning: Goal: Ability to identify and utilize available resources and services will improve Outcome: Not Progressing Goal: Ability to manage health-related needs will improve Outcome: Not Progressing   Problem: Metabolic: Goal: Ability to maintain appropriate glucose levels will improve Outcome: Not Progressing   Problem: Nutritional: Goal: Maintenance of adequate nutrition will improve Outcome: Not Progressing Goal: Progress toward achieving an optimal weight will improve Outcome: Not Progressing   Problem: Skin Integrity: Goal: Risk for impaired skin integrity will decrease Outcome: Not Progressing   Problem: Tissue Perfusion: Goal: Adequacy of tissue perfusion will improve Outcome: Not Progressing   Problem: Activity: Goal: Ability to tolerate increased activity will improve Outcome: Not Progressing   Problem: Respiratory: Goal: Ability to maintain a clear airway and adequate ventilation will improve Outcome: Not Progressing   Problem: Role Relationship: Goal: Method of communication will improve Outcome: Not Progressing

## 2023-04-28 NOTE — Plan of Care (Signed)
  Problem: Skin Integrity: Goal: Risk for impaired skin integrity will decrease Outcome: Progressing   Problem: Education: Goal: Knowledge of General Education information will improve Description: Including pain rating scale, medication(s)/side effects and non-pharmacologic comfort measures Outcome: Not Progressing   Problem: Health Behavior/Discharge Planning: Goal: Ability to manage health-related needs will improve Outcome: Not Progressing   Problem: Clinical Measurements: Goal: Ability to maintain clinical measurements within normal limits will improve Outcome: Not Progressing Goal: Will remain free from infection Outcome: Not Progressing Goal: Diagnostic test results will improve Outcome: Not Progressing Goal: Respiratory complications will improve Outcome: Not Progressing   Problem: Activity: Goal: Risk for activity intolerance will decrease Outcome: Not Progressing   Problem: Nutrition: Goal: Adequate nutrition will be maintained Outcome: Not Progressing   Problem: Coping: Goal: Level of anxiety will decrease Outcome: Not Progressing   Problem: Elimination: Goal: Will not experience complications related to bowel motility Outcome: Not Progressing Goal: Will not experience complications related to urinary retention Outcome: Not Progressing   Problem: Pain Managment: Goal: General experience of comfort will improve and/or be controlled Outcome: Not Progressing   Problem: Safety: Goal: Ability to remain free from injury will improve Outcome: Not Progressing   Problem: Skin Integrity: Goal: Risk for impaired skin integrity will decrease Outcome: Not Progressing   Problem: Education: Goal: Ability to describe self-care measures that may prevent or decrease complications (Diabetes Survival Skills Education) will improve Outcome: Not Progressing Goal: Individualized Educational Video(s) Outcome: Not Progressing   Problem: Coping: Goal: Ability to adjust to  condition or change in health will improve Outcome: Not Progressing   Problem: Fluid Volume: Goal: Ability to maintain a balanced intake and output will improve Outcome: Not Progressing   Problem: Health Behavior/Discharge Planning: Goal: Ability to identify and utilize available resources and services will improve Outcome: Not Progressing Goal: Ability to manage health-related needs will improve Outcome: Not Progressing   Problem: Metabolic: Goal: Ability to maintain appropriate glucose levels will improve Outcome: Not Progressing   Problem: Nutritional: Goal: Maintenance of adequate nutrition will improve Outcome: Not Progressing Goal: Progress toward achieving an optimal weight will improve Outcome: Not Progressing   Problem: Tissue Perfusion: Goal: Adequacy of tissue perfusion will improve Outcome: Not Progressing   Problem: Activity: Goal: Ability to tolerate increased activity will improve Outcome: Not Progressing   Problem: Respiratory: Goal: Ability to maintain a clear airway and adequate ventilation will improve Outcome: Not Progressing   Problem: Role Relationship: Goal: Method of communication will improve Outcome: Not Progressing

## 2023-04-28 NOTE — Progress Notes (Signed)
*  PRELIMINARY RESULTS* Echocardiogram 2D Echocardiogram has been performed.  Carolyne Fiscal 04/28/2023, 4:43 PM

## 2023-04-28 NOTE — Progress Notes (Signed)
 Pt intubated and sedated

## 2023-04-28 NOTE — Consult Note (Signed)
 [facility] CLINIC CARDIOLOGY CONSULT NOTE       Patient ID: Beth [name] MRN: [id number] DOB/AGE: 02-20-1945 78 y.o.  Admit date: 04/25/2023 Referring Physician Dr. Renae Gloss Primary Physician Althea Charon, Netta Neat, DO  Primary Cardiologist Dr. Melton Alar Reason for Consultation tachycardia, elevated troponin  HPI: Beth [name] is a 78 y.o. female  with a past medical history of paroxysmal atrial fibrillation on eliquis, hypertension, hyperlipidemia, COPD on home 3L, depression who presented to the ED on 04/25/2023 for suicidal ideation. Has been treated for pneumonia and acute on chronic respiratory failure, decompensated earlier this AM requiring intubation. Was tachycardia and troponins found to be elevated this AM as well. Cardiology was consulted for further evaluation.   Patient came into ED on 03/28 for a suicide attempt. Today (3/31) patient respiratory status decompensated requiring intubation and went into VT with rates 170s. 150 mg Amio bolus x2 and 2g IV Mg given and patient converted into NSR rate in 70s. Patient is hemodynamically stable and afebrile. Today, Electrolytes are stable with Na 141, K 4.8. Cr 0.72, Hgb down today 9.6 from 13.2 two days ago. CXR today with worsening right sided infiltrate. BNP 1612.  Troponin elevated and flat 481 > 449. Baseline EKG on 03/30 with NSR at 63 bpm. EKG this AM showed VT at 180 bpm.  At the time of my evaluation the patient is intubated and sedated in the ICU with no family present. HR in the 170-180s sustaining in SVT with stable BP. Started on IV amiodarone. Upon reassessment she converted to NSR with controlled HR.   Review of systems complete and found to be negative unless listed above    Past Medical History:  Diagnosis Date   Allergy    Anxiety    Asthma    Chronic pain syndrome    discharged from pain clinic, hx of narcotics seeking behavior   COPD (chronic obstructive pulmonary disease) (HCC)    CVA (cerebral  infarction)    Depression    Fibromyalgia    Headache    Hyperlipidemia    Hypertension    IBS (irritable bowel syndrome)    Stroke (HCC)    Vitamin D deficiency     Past Surgical History:  Procedure Laterality Date   ABDOMINAL HYSTERECTOMY     APPENDECTOMY     BREAST EXCISIONAL BIOPSY  2011   Pt states lump removed, ? side, no scar seen   BREAST SURGERY  2011   biopsy   CERVICAL DISCECTOMY     CHOLECYSTECTOMY     SINUSOTOMY      Medications Prior to Admission  Medication Sig Dispense Refill Last Dose/Taking   acetaminophen (TYLENOL) 500 MG tablet Take 500 mg by mouth 3 (three) times daily as needed for mild pain (pain score 1-3).   Taking As Needed   amiodarone (PACERONE) 200 MG tablet Take 1 tablet (200 mg total) by mouth 2 (two) times daily for 12 days, THEN 1 tablet (200 mg total) daily.   04/24/2023 at  8:00 AM   apixaban (ELIQUIS) 5 MG TABS tablet Take 1 tablet (5 mg total) by mouth 2 (two) times daily. 60 tablet 1 04/24/2023 at  5:00 PM   atorvastatin (LIPITOR) 40 MG tablet TAKE 1 TABLET BY MOUTH EVERY DAY (Patient taking differently: Take 40 mg by mouth at bedtime. TAKE 1 TABLET BY MOUTH EVERY DAY) 90 tablet 3 04/24/2023 at  6:00 PM   busPIRone (BUSPAR) 10 MG tablet Take 10 mg by mouth 2 (two) times  daily.   04/24/2023 at  4:00 PM   camphor-menthol (SARNA) lotion Apply 1 Application topically 2 (two) times daily.   04/24/2023 at  3:00 PM   FLUoxetine (PROZAC) 10 MG tablet Take 15 mg by mouth daily.   04/24/2023 at  8:00 AM   furosemide (LASIX) 40 MG tablet Take 1 tablet (40 mg total) by mouth daily. If swelling is less, can take only 1 pill.   04/24/2023 at  8:00 AM   gabapentin (NEURONTIN) 100 MG capsule Take 1 capsule (100 mg total) by mouth 3 (three) times daily.   04/24/2023 at  8:00 PM   guaiFENesin (MUCINEX) 600 MG 12 hr tablet Take 600 mg by mouth every 12 (twelve) hours.   04/24/2023 at  8:00 PM   lamoTRIgine (LAMICTAL) 25 MG tablet Take 12.5 mg by mouth daily.   04/24/2023 at   8:00 AM   levothyroxine (SYNTHROID) 25 MCG tablet Take 1 tablet (25 mcg total) by mouth daily at 6 (six) AM. (Patient taking differently: Take 12.5 mcg by mouth daily at 6 (six) AM.)   04/24/2023 at  6:00 AM   LORazepam (ATIVAN) 0.5 MG tablet Take 0.5 mg by mouth every 8 (eight) hours as needed for anxiety, sleep, seizure or sedation.   Taking As Needed   melatonin 5 MG TABS Take 1 tablet (5 mg total) by mouth at bedtime.   04/24/2023 at  8:00 PM   Menthol-Zinc Oxide (CALMOSEPTINE) 0.44-20.6 % OINT Apply 1 application  topically as directed. Apply to area at every shift change   04/24/2023 Evening   metoprolol tartrate (LOPRESSOR) 25 MG tablet Take 0.5 tablets (12.5 mg total) by mouth 2 (two) times daily.   04/24/2023 at  8:00 PM   mirtazapine (REMERON) 15 MG tablet Take 15 mg by mouth at bedtime.   04/24/2023 at  8:00 PM   montelukast (SINGULAIR) 10 MG tablet TAKE 1 TABLET(10 MG) BY MOUTH AT BEDTIME 90 tablet 1 04/24/2023 at  8:00 PM   NYAMYC powder Apply 1 Application topically 2 (two) times daily.   04/24/2023 at  4:00 PM   polyethylene glycol (MIRALAX / GLYCOLAX) 17 g packet Take 17 g by mouth daily as needed for mild constipation.   Taking As Needed   traMADol (ULTRAM) 50 MG tablet Take 50 mg by mouth 3 (three) times daily as needed for moderate pain (pain score 4-6).   Taking As Needed   traZODone (DESYREL) 50 MG tablet Take 1 tablet (50 mg total) by mouth at bedtime.   04/24/2023 at  8:00 PM   TRELEGY ELLIPTA 200-62.5-25 MCG/ACT AEPB INHALE 1 PUFF INTO THE LUNGS DAILY 60 each 2 04/24/2023 at  8:00 AM   albuterol (VENTOLIN HFA) 108 (90 Base) MCG/ACT inhaler Inhale 1-2 puffs into the lungs every 6 (six) hours as needed for wheezing or shortness of breath. (Patient not taking: Reported on 04/25/2023) 18 g 2 Not Taking   Social History   Socioeconomic History   Marital status: Legally Separated    Spouse name: Not on file   Number of children: 2   Years of education: Not on file   Highest education  level: Not on file  Occupational History   Occupation: retired  Tobacco Use   Smoking status: Some Days    Current packs/day: 0.00    Average packs/day: 0.3 packs/day for 57.0 years (14.3 ttl pk-yrs)    Types: Cigarettes    Start date: 10/25/1963    Last attempt to quit: 10/24/2020  Years since quitting: 2.5   Smokeless tobacco: Never   Tobacco comments:    16 cigarettes a week. Khj 07/26/2022  Vaping Use   Vaping status: Never Used  Substance and Sexual Activity   Alcohol use: No    Alcohol/week: 0.0 standard drinks of alcohol   Drug use: No   Sexual activity: Not Currently  Other Topics Concern   Not on file  Social History Narrative   Not on file   Social Drivers of Health   Financial Resource Strain: Low Risk  (02/01/2022)   Overall Financial Resource Strain (CARDIA)    Difficulty of Paying Living Expenses: Not hard at all  Food Insecurity: No Food Insecurity (04/25/2023)   Hunger Vital Sign    Worried About Running Out of Food in the Last Year: Never true    Ran Out of Food in the Last Year: Never true  Transportation Needs: Unmet Transportation Needs (04/25/2023)   PRAPARE - Transportation    Lack of Transportation (Medical): Yes    Lack of Transportation (Non-Medical): Yes  Physical Activity: Insufficiently Active (02/01/2022)   Exercise Vital Sign    Days of Exercise per Week: 7 days    Minutes of Exercise per Session: 20 min  Stress: No Stress Concern Present (02/01/2022)   [employer] of Occupational Health - Occupational Stress Questionnaire    Feeling of Stress : Only a little  Social Connections: Socially Isolated (04/25/2023)   Social Connection and Isolation Panel [NHANES]    Frequency of Communication with Friends and Family: Twice a week    Frequency of Social Gatherings with Friends and Family: Once a week    Attends Religious Services: Never    Database administrator or Organizations: No    Attends Banker Meetings: Never    Marital  Status: Separated  Intimate Partner Violence: Not At Risk (04/25/2023)   Humiliation, Afraid, Rape, and Kick questionnaire    Fear of Current or Ex-Partner: No    Emotionally Abused: No    Physically Abused: No    Sexually Abused: No    Family History  Problem Relation Age of Onset   Anxiety disorder Mother    Depression Mother    Breast cancer Mother 21   Cancer Father    Gallbladder disease Father    Alcohol abuse Father    Depression Father    Bipolar disorder Son      Vitals:   04/28/23 0822 04/28/23 0824 04/28/23 0825 04/28/23 0826  BP:      Pulse: (!) 119 (!) 108 (!) 110 (!) 108  Resp: (!) 29 (!) 24 14 (!) 24  Temp:      TempSrc:      SpO2: (!) 83% (!) 80% (!) 78% (!) 78%  Weight:      Height:        PHYSICAL EXAM General: Ill appearing elderly female, intubated and sedated. HEENT: Normocephalic and atraumatic. Neck: No JVD.  Lungs: Intubated, mechanical breath sounds Heart: Irregularly irregular, fast rate. Normal S1 and S2 without gallops or murmurs.  Abdomen: Non-distended appearing.  Msk: Normal strength and tone for age. Extremities: Warm and well perfused. No clubbing, cyanosis. No edema.  Neuro: Alert and oriented X 3. Psych: Answers questions appropriately.   Labs: Basic Metabolic Panel: Recent Labs    04/26/23 0504 04/26/23 2222 04/27/23 0149 04/27/23 2123 04/28/23 0408  NA 141   < > 143 140 141  K 4.0   < > 4.3 4.5 4.8  CL 111   < > 111 109 107  CO2 25   < > 21* 23 26  GLUCOSE 104*   < > 174* 134* 139*  BUN 10   < > 13 14 14   CREATININE 0.68   < > 0.89 0.79 0.72  CALCIUM 8.3*   < > 8.4* 8.7* 9.0  MG 2.0   < > 1.8 2.2 2.2  PHOS 2.7  --  4.5  --   --    < > = values in this interval not displayed.   Liver Function Tests: Recent Labs    04/26/23 2222  AST 64*  ALT 31  ALKPHOS 95  BILITOT 0.4  PROT 6.4*  ALBUMIN 2.5*   No results for input(s): "LIPASE", "AMYLASE" in the last 72 hours. CBC: Recent Labs    04/26/23 2222  04/28/23 0408  WBC 15.9* 15.8*  NEUTROABS 8.0*  --   HGB 13.2 9.6*  HCT 42.1 29.5*  MCV 95.9 92.8  PLT 325 176   Cardiac Enzymes: Recent Labs    04/27/23 2123 04/27/23 2347  TROPONINIHS 481* 449*   BNP: Recent Labs    04/28/23 1004  BNP 1,612.0*   D-Dimer: No results for input(s): "DDIMER" in the last 72 hours. Hemoglobin A1C: Recent Labs    04/27/23 0150  HGBA1C 5.2   Fasting Lipid Panel: No results for input(s): "CHOL", "HDL", "LDLCALC", "TRIG", "CHOLHDL", "LDLDIRECT" in the last 72 hours. Thyroid Function Tests: No results for input(s): "TSH", "T4TOTAL", "T3FREE", "THYROIDAB" in the last 72 hours.  Invalid input(s): "FREET3" Anemia Panel: No results for input(s): "VITAMINB12", "FOLATE", "FERRITIN", "TIBC", "IRON", "RETICCTPCT" in the last 72 hours.   Radiology: [facility] Chest Port 1 View Result Date: 04/28/2023 CLINICAL DATA:  Intubation EXAM: PORTABLE CHEST 1 VIEW COMPARISON:  Two days ago FINDINGS: Diffuse interstitial opacity with patchy airspace density greatest at the right base, progressed at the right base. There is small volume pleural fluid likely. No pneumothorax. Endotracheal tube with tip between the clavicular heads and carina. An enteric tube reaches the stomach at least. Normal heart size IMPRESSION: Worsening infiltrate especially at the right lung base. New hardware in unremarkable position. Electronically Signed   By: Tiburcio Pea M.D.   On: 04/28/2023 10:26   DG Abd 1 View Result Date: 04/28/2023 CLINICAL DATA:  NG placement. EXAM: ABDOMEN - 1 VIEW COMPARISON:  None Available. FINDINGS: Enteric tube with tip in the region of the distal stomach. No bowel dilatation noted. Small bilateral pleural effusions and diffuse interstitial and bibasilar densities. IMPRESSION: Enteric tube with tip in the distal stomach. Electronically Signed   By: Elgie Collard M.D.   On: 04/28/2023 10:23   DG Chest Port 1 View Result Date: 04/26/2023 CLINICAL DATA:   Respiratory failure EXAM: PORTABLE CHEST 1 VIEW COMPARISON:  04/26/2023 at 9:13 a.m. FINDINGS: Increased predominantly interstitial opacities bilaterally greatest in the left mid/upper lung. No pleural effusion or pneumothorax. Stable cardiomediastinal silhouette. Aortic atherosclerotic calcification. IMPRESSION: Increased predominantly interstitial opacities bilaterally greatest in the left mid/upper lung. Findings favor pneumonia superimposed on a background emphysema. Electronically Signed   By: Minerva Fester M.D.   On: 04/26/2023 23:49   DG Chest Port 1 View Result Date: 04/26/2023 CLINICAL DATA:  Cough. Recent antibiotics for pneumonia with resulting diarrhea. History of CVA and atrial fibrillation. EXAM: PORTABLE CHEST 1 VIEW COMPARISON:  None recent. Chest CT 09/13/2022. Radiographs 09/13/2022 and 08/25/2022. FINDINGS: 0913 hours. The heart size and mediastinal contours are stable with mild aortic atherosclerosis.  The aeration of the lungs has improved compared with the available prior studies. There are patchy pulmonary opacities bilaterally which could reflect postinflammatory scarring or recurrent infection. There is no consolidation, significant pleural effusion or pneumothorax. The bones appear unchanged status post lower cervical fusion. IMPRESSION: Overall improvement in bilateral airspace opacities compared with prior studies from August. Patchy pulmonary opacities bilaterally which could reflect postinflammatory scarring or recurrent infection. No consolidation or pleural effusion. Electronically Signed   By: Carey Bullocks M.D.   On: 04/26/2023 13:05    ECHO ordered  TELEMETRY reviewed by me 04/28/2023: VT rate 170-180s  EKG reviewed by me: VT 180 bpm  Data reviewed by me 04/28/2023: last 24h vitals tele labs imaging I/O hospitalist progress notes, PCCM notes  Principal Problem:   Acute on chronic respiratory failure with hypoxia and hypercapnia (HCC) Active Problems:   Pulmonary  hypertension (HCC)   Chronic diastolic congestive heart failure (HCC)   Depression with suicidal ideation   Asthma with COPD (HCC)   Hypokalemia   Prolonged QT interval   UTI (urinary tract infection)   Atrial fibrillation with slow ventricular response (HCC)   Other specified hypothyroidism   Hypomagnesemia   Suicidal ideation   Diarrhea   Hypophosphatemia   Septic shock (HCC)   Metabolic acidosis   Multifocal pneumonia   Precordial chest pain   Respiratory distress    ASSESSMENT AND PLAN:  Beth [name] is a 78 y.o. female  with a past medical history of paroxysmal atrial fibrillation on eliquis, hypertension, hyperlipidemia, COPD on home 3L, depression who presented to the ED on 04/25/2023 for suicidal ideation. Has been treated for pneumonia and acute on chronic respiratory failure, decompensated earlier this AM requiring intubation. Was tachycardia and troponins found to be elevated this AM as well. Cardiology was consulted for further evaluation.   # Acute on Chronic respiratory failure # Pneumonia # Ventricular Tachycardia # Demand Ischemia   # Paroxysmal Atrial fibrillation  Patient's respiratory status decompensated this morning that required intubation and had VT in 170-180s. Patient received 2x bolus of 150 mg Amio and IV 2g Mg, converted to NSR rate in 70s. Patient is hemodynamically stable. Trops elevated and flat 481 > 449 likely due to demand ischemia. Today labs showed elevated BNP 1612 and echo is ordered.  -Echo pending  -Continue IV Amio infusion, this will likely not be a long-term option given her COPD. -IV lasix 20 mg x1 ordered, will evaluate response and monitor renal function and UOP.  -Continue Eliquis for stroke risk reduction  -Continue atorvastatin 40 mg, metoprolol tartrate 12.5 mg bid per tube.  -Further management of respiratory failure per PCCM.   This patient's plan of care was discussed and created with Dr. Darrold Junker and he is in  agreement.  Signed: Gale Journey, PA-C  04/28/2023, 10:56 AM [facility] Cardiology

## 2023-04-28 NOTE — Progress Notes (Signed)
 Walked in the room and patient just placed on Bipap, struggling to breath, HR in the 170's looks like SVT, intermittent vtach.  Initially help amiodarone with prolonged qt.  Critical care team to intubate and take over care. Updated son on the phone Will consult cardiology also  Physical Exam Cardiovascular:     Rate and Rhythm: Tachycardia present. Rhythm irregularly irregular.     Heart sounds: Normal heart sounds, S1 normal and S2 normal.  Pulmonary:     Effort: Accessory muscle usage present.     Breath sounds: Examination of the right-middle field reveals decreased breath sounds and wheezing. Examination of the left-middle field reveals decreased breath sounds and wheezing. Examination of the right-lower field reveals decreased breath sounds and rhonchi. Examination of the left-lower field reveals decreased breath sounds and rhonchi. Decreased breath sounds, wheezing and rhonchi present.     Dr Renae Gloss Since critical care taking over I wont bill this visit

## 2023-04-28 NOTE — Progress Notes (Signed)
 Initial Nutrition Assessment  DOCUMENTATION CODES:   Severe malnutrition in context of chronic illness  INTERVENTION:   If tube feeds initiated, recommend:  Vital HP @50ml /hr- Initiate at 51ml/hr and increase by 62ml/hr q 8 hours until goal rate is reached.   Free water flushes 30ml q4 hours to maintain tube patency   Regimen provides 1200kcal/day, 105g/day protein and 114ml/day of free water.   MVI daily via tube   Pt at high refeed risk; recommend monitor potassium, magnesium and phosphorus labs daily until stable  Daily weights   NUTRITION DIAGNOSIS:   Severe Malnutrition related to chronic illness as evidenced by severe fat depletion, severe muscle depletion.  GOAL:   Provide needs based on ASPEN/SCCM guidelines  MONITOR:   Vent status, Labs, Weight trends, TF tolerance, I & O's, Skin  REASON FOR ASSESSMENT:   Ventilator    ASSESSMENT:   78 y.o. female with PMHx significant for CVA with left sided weakness, COPD requiring 3L supplemental O2 at baseline, A. Fib on Eliquis, IBS, GERD, CHF, CKD III, HLD. pulmonary hypertension, thyroid disease, depression and anxiety who was admitted with suicidal ideation and E. Coli UTI complicated by respiratory failure, flash pulmonary edema, AECOPD, multifocal pneumonia and SVT.  Pt sedated and ventilated. OGT in place. Plan is to initiate tube feeds tomorrow if patient does not extubate. Pt's son at bedside reports pt with poor appetite and oral intake for the past month. Son reports that patient had lost ~15lbs after going to [employer] and reports she has lost ~15lbs over the past month. Per chart, pt does appear to be down ~29lbs(19%) over the past year; this is significant weight loss. Son reports that patient has been coughing with meals; pt will need SLP evaluation prior to initiation of a diet.     Medications reviewed and include: colace, insulin, synthroid, melatonin, solu-medrol, zofran, protonix, miralax,  levophed, zosyn, propofol    Labs reviewed: K 4.8 wnl, Mg 2.2 wnl BNP- 1612(H) Wbc- 15.8(H),  Hgb 9.6(L), Hct 29.5(L) Cbgs- 108, 201, 311, 136 x 24 hrs  AIC 5.2- 3/30  Patient is currently intubated on ventilator support MV: 9.6 L/min Temp (24hrs), Avg:97.8 F (36.6 C), Min:97.4 F (36.3 C), Max:98.4 F (36.9 C)  Propofol: 11.42 ml/hr- provides 301kcal/day   MAP >63mmHg   UOP-   NUTRITION - FOCUSED PHYSICAL EXAM:  Flowsheet Row Most Recent Value  Orbital Region Severe depletion  Upper Arm Region Severe depletion  Thoracic and Lumbar Region Severe depletion  Buccal Region Severe depletion  Temple Region Severe depletion  Clavicle Bone Region Severe depletion  Clavicle and Acromion Bone Region Severe depletion  Scapular Bone Region Severe depletion  Dorsal Hand Severe depletion  Patellar Region Moderate depletion  Anterior Thigh Region Moderate depletion  Posterior Calf Region Moderate depletion  Edema (RD Assessment) None  Hair Reviewed  Eyes Reviewed  Mouth Reviewed  Skin Reviewed  Nails Reviewed   Diet Order:   Diet Order             Diet NPO time specified  Diet effective now                  EDUCATION NEEDS:   No education needs have been identified at this time  Skin:  Skin Assessment: Reviewed RN Assessment  Last BM:  3/30- type 6  Height:   Ht Readings from Last 1 Encounters:  04/24/23 5\' 5"  (1.651 m)    Weight:   Wt Readings from Last 1  Encounters:  04/24/23 54.4 kg    Ideal Body Weight:  56.8 kg  BMI:  Body mass index is 19.97 kg/m.  Estimated Nutritional Needs:   Kcal:  1232kcal/day  Protein:  90-100g/day  Fluid:  1.4-1.6L/day  Betsey Holiday MS, RD, LDN If unable to be reached, please send secure chat to "RD inpatient" available from 8:00a-4:00p daily

## 2023-04-28 NOTE — Progress Notes (Signed)
 NT called this RN to room during patient's bath because patient was stating she was having difficulty breathing. Upon arrival to room patient is lying on her side attempting to sit up. Accessory muscle use and mouth breathing noted. O2 94% on 4 liters. Assisted patient to sit up, and attempted to have patient take slow deep breaths through her nose. O2 increased to 6 liters. Patient's respiratory status quickly declined with increased RR, accessory muscle use, and 02 dropping to the low 70s. RT called to place patient on BiPAP. Once on BiPAP patient's work of breathing and O2 saturation improved, however her heart rate increased to 160s-190s. Dr. Renae Gloss to bedside who STAT consulted ICU team for intubation. Cardiology consulted for abnormal rhythm. Patient intubated without difficulty. Propofol infusion initiated. 300 mg Amio bolus given and infusion started.

## 2023-04-28 NOTE — Consult Note (Signed)
 NAME:  Beth [name], MRN:  [id number], DOB:  [birth year], LOS: 3 ADMISSION DATE:  04/25/2023, CONSULTATION DATE:  04/28/23 REFERRING MD:  Dr. Renae Gloss, CHIEF COMPLAINT:  Acute Respiratory Distress   Brief Pt Description / Synopsis:  78 y.o. female with PMHx significant for CVA with left sided weakness, COPD requiring 3L supplemental O2 at baseline, A. Fib on Eliquis, depression and anxiety presenting with suicidal ideation and E. Coli UTI.  Hospital course complicated by Acute on Chronic Hypoxic & Hypercapnic Respiratory Failure in the setting of flash pulmonary edema, AECOPD, and questionable worsening multifocal pneumonia, along with SVT and Ventricular Tachycardia which converted with Amiodarone bolus x2.  Failed trial of BiPAP requiring intubation and mechanical ventilation.  History of Present Illness:  78 y.o. female with medical history significant for CVA with left-sided residual weakness, depression, anxiety, neuropathy, insomnia, atrial fibrillation on Eliquis, COPD on home O2 at 3 L who was brought to the ED after she tried to stab herself with scissors.  She endorses feeling suicidal.   Pt currently unable to contribute to history due to Acute Respiratory Distress and BiPAP, therefore history is obtained from chart review.    Per H&P from Hospitalist, she recently recovered from a bout of pneumonia, completing antibiotic treatment. She did endorse diarrhea in the few days prior. She has had very poor oral intake.   ED Course: Initial Vital Signs: BP slightly elevated on arrival at 170/72, pulse in the 50s otherwise normal vitals  Significant Labs:  potassium of 2.2 and magnesium 1.6.  TSH slightly elevated at 5.528.  Free T4 also elevated at 1.66 Urinalysis with moderate leukocytes CBC WNL, BMP except for potassium WNL. UDS, salicylate, EtOH and acetaminophen levels within normal parameters EKG, personally reviewed and interpreted showing sinus bradycardia at 51 and prolonged QT to  648 Imaging Chest X-ray>> Medications Administered: Patient was started on ceftriaxone for possibility of UTI.  Also given oral and IV potassium repletion.   Seen by behavioral health,  Hospitalist consulted for admission.   Please see "Significant Hospital Events" section below for full detailed hospital course.   Pertinent  Medical History   Past Medical History:  Diagnosis Date   Allergy    Anxiety    Asthma    Chronic pain syndrome    discharged from pain clinic, hx of narcotics seeking behavior   COPD (chronic obstructive pulmonary disease) (HCC)    CVA (cerebral infarction)    Depression    Fibromyalgia    Headache    Hyperlipidemia    Hypertension    IBS (irritable bowel syndrome)    Stroke (HCC)    Vitamin D deficiency     Micro Data:  3/27: Urine>> E. Coli 3/28: GI panel & C.Diff PCR>> negative 3/29: SARS-CoV-2/Flu/RSV PCR>> negative 3/29: Blood cultures x2>> no growth to date 3/30: MRSA PCR>> negative 3/31: Tracheal aspirate>> 3/31: RVP>> 3/31: Strep pneumo urinary antigen>> 3/31: Legionella urinary antigen>>  Antimicrobials:   Anti-infectives (From admission, onward)    Start     Dose/Rate Route Frequency Ordered Stop   04/28/23 0030  vancomycin (VANCOREADY) IVPB 750 mg/150 mL  Status:  Discontinued        750 mg 150 mL/hr over 60 Minutes Intravenous Every 24 hours 04/27/23 0008 04/27/23 1040   04/27/23 0100  vancomycin (VANCOREADY) IVPB 1250 mg/250 mL        1,250 mg 166.7 mL/hr over 90 Minutes Intravenous  Once 04/27/23 0001 04/27/23 0201   04/27/23 0100  piperacillin-tazobactam (ZOSYN)  IVPB 3.375 g        3.375 g 12.5 mL/hr over 240 Minutes Intravenous Every 8 hours 04/27/23 0001     04/25/23 2330  cefTRIAXone (ROCEPHIN) 1 g in sodium chloride 0.9 % 100 mL IVPB  Status:  Discontinued        1 g 200 mL/hr over 30 Minutes Intravenous Every 24 hours 04/25/23 1216 04/26/23 2347   04/25/23 0115  cefTRIAXone (ROCEPHIN) 1 g in sodium chloride 0.9 %  100 mL IVPB        1 g 200 mL/hr over 30 Minutes Intravenous  Once 04/25/23 0100 04/25/23 0155       Significant Hospital Events: Including procedures, antibiotic start and stop dates in addition to other pertinent events   3/28.  Admitted by [facility]. Potassium still low this morning IV and oral potassium ordered.  3/29.  Psychiatry changed her medications over to Remeron at night secondary to prolonged QT.  Electrolytes much improved.  Patient felt short of breath this morning.  Chest x-ray showing overall improvement in bilateral airspace opacities from prior studies. Patient had worsening shortness of breath and was acidotic and sent to the ICU for BiPAP and started on aggressive antibiotics and steroids.  3/30.  Patient did not like the BiPAP mask.  Placed back on nasal cannula.  On IV fluids.  Continue Zosyn.  Discontinue vancomycin with MRSA PCR being negative.  Continue Solu-Medrol. 3/31: Developed severe acute respiratory distress (suspecting flash pulmonary edema and AECOPD) and SVT/ V. Tach requiring intubation and mechanical ventilation.  Bolused with Amiodarone x2, Cardiology consulted, Echocardiogram pending.  Interim History / Subjective:  -Patient abruptly developed acute respiratory distress during bath -Currently on BiPAP with respiratory rate in the 40s, working to breathe, concern about impending respiratory arrest ~proceed with emergent intubation -Also with SVT with intermittent episodes of V. tach on telemetry ~following intubation given to amiodarone boluses with conversion back to sinus tachycardia, cardiology consulted appreciate input  Objective   Blood pressure 128/72, pulse 64, temperature 97.7 F (36.5 C), temperature source Oral, resp. rate 17, height 5\' 5"  (1.651 m), weight 54.4 kg, SpO2 100%.        Intake/Output Summary (Last 24 hours) at 04/28/2023 0857 Last data filed at 04/28/2023 0600 Gross per 24 hour  Intake 2133.44 ml  Output 875 ml  Net 1258.44 ml    Filed Weights   04/24/23 2246  Weight: 54.4 kg    Examination: General: Critically ill-appearing female, laying in bed, on BiPAP, with severe respiratory distress HENT: Atraumatic, normocephalic, neck supple, positive JVD Lungs: Coarse breath sounds with expiratory wheezing throughout, labored, accessory muscle use, tachypnea Cardiovascular: Tachycardia, regular rhythm, no murmurs, rubs, gallops Abdomen: Soft, nontender, nondistended, no guarding or tenderness, bowel sounds positive x 4 Extremities: Normal bulk and tone, no deformities, trace edema bilateral lower extremities Neuro: Awake and alert, difficult to assess orientation due to respiratory distress and BiPAP GU: External female catheter in place  Resolved Hospital Problem list     Assessment & Plan:   #Acute on Chronic Hypoxic & Hypercapnic Respiratory Failure in the setting of ... #Flash Pulmonary Edema #Acute COPD Exacerbation #Multifocal Pneumonia PMHx: Pulmonary HTN, Asthma -Full vent support, implement lung protective strategies -Plateau pressures less than 30 cm H20 -Wean FiO2 & PEEP as tolerated to maintain O2 sats >92% -Follow intermittent Chest X-ray & ABG as needed -Spontaneous Breathing Trials when respiratory parameters met and mental status permits -Implement VAP Bundle -Bronchodilators -IV Steroids (Solumedrol 40 mg BID) -ABX  as above -Diuresis as BP and renal function permits ~ will give 20 mg IV Lasix 3/31 due to soft BP  #SVT & Ventricular Tachycardia ~ converted to Sinus Tachycardia #Acute on Chronic HFpEF #Paroxsymal Atrial Fibrillation  #Elevated Troponin in setting of demand ischemia vs NSTEMI PMHx: Atrial Fibrillation on Eliquis Echocardiogram 09/09/22: LVEF 70-75%, mild LVH, unable to evaluate diastolic parameters.  Pulmonary artery pressure moderately to severely elevated.  RV systolic function and size normal.  Moderate to severe Tricuspid valve regurgitation. -Continuous cardiac  monitoring -Maintain MAP >65 -Cautious IV fluids -Vasopressors as needed to maintain MAP goal -Trend HS Troponin until peaked -Echocardiogram pending -Cardiology consulted, appreciate input -Diuresis as BP and renal function permits ~ will give 20 mg IV Lasix x1 dose on 3/31 given soft BP -Amiodarone 150 mg bolus x2 with conversion to Sinus Tach ~ continue Amiodarone gtt as per Cardiology -Continue Eliquis ~ may need to transition over to Heparin gtt   #Meets SIRS Criteria (HR 170s, RR 30's, WBC 15.8) #Severe Sepsis due to ... #Multifocal Pneumonia #E. Coli UTI -Monitor fever curve -Trend WBC's & Procalcitonin -Follow cultures as above -Continue empiric Zosyn pending cultures & sensitivities  #Normocytic Normochromic Anemia without s/sx of bleeding -Monitor for S/Sx of bleeding -Trend CBC -Eliquis for AC/VTE Prophylaxis  -Transfuse for Hgb <7  #Hypothyroidism -Continue Synthroid  #Hyperglycemia -CBG's q4h; Target range of 140 to 180 -SSI -Follow ICU Hypo/Hyperglycemia protocol  #Sedation needs in setting of mechanical ventilation #Suicidal Ideation #Depression -Maintain a RASS goal of 0 to -1 -Fentanyl and Propofol as needed to maintain RASS goal -Avoid sedating medications as able -Daily wake up assessment -Was seen by Psychiatry, recommended continuing 1:1 sitter and IVC with reassessment for inpatient BH admit ~ Once extubated will need to reinitiate Safety Suicide sitter and repeat Psych evaluation       Pt is critically ill with multiorgan failure. Prognosis is guarded, high risk for further decompensation, cardiac arrest, and death.   Given current critical illness superimposed on multiple chronic co-morbidities and advanced age, overall long term prognosis is poor.  Anticipate she may be difficult to liberate from mechanical ventilation due to severe COPD at baseline. Recommend consideration of DNR/DNI status.   Best Practice (right click and "Reselect all  SmartList Selections" daily)   Diet/type: NPO DVT prophylaxis: DOAC GI prophylaxis: PPI Lines: N/A Foley:  N/A Code Status:  full code Last date of multidisciplinary goals of care discussion [3/31]  3/31: Pt's son updated at bedside by Dr. Aundria Rud on plan of care.  Labs   CBC: Recent Labs  Lab 04/24/23 2321 04/26/23 2222 04/28/23 0408  WBC 9.4 15.9* 15.8*  NEUTROABS  --  8.0*  --   HGB 13.1 13.2 9.6*  HCT 40.3 42.1 29.5*  MCV 94.4 95.9 92.8  PLT 279 325 176    Basic Metabolic Panel: Recent Labs  Lab 04/25/23 1534 04/26/23 0504 04/26/23 2222 04/27/23 0149 04/27/23 2123 04/28/23 0408  NA 142 141 142 143 140 141  K 4.0 4.0 4.6 4.3 4.5 4.8  CL 108 111 109 111 109 107  CO2 24 25 17* 21* 23 26  GLUCOSE 144* 104* 256* 174* 134* 139*  BUN 12 10 13 13 14 14   CREATININE 0.97 0.68 0.94 0.89 0.79 0.72  CALCIUM 8.3* 8.3* 8.5* 8.4* 8.7* 9.0  MG 2.0 2.0 2.0 1.8 2.2  --   PHOS 1.4* 2.7  --  4.5  --   --    GFR: Estimated Creatinine Clearance:  49 mL/min (by C-G formula based on SCr of 0.72 mg/dL). Recent Labs  Lab 04/24/23 2321 04/26/23 0504 04/26/23 2222 04/27/23 0150 04/27/23 1202 04/28/23 0408  PROCALCITON  --  <0.10 <0.10  --   --   --   WBC 9.4  --  15.9*  --   --  15.8*  LATICACIDVEN  --   --  6.3* 4.4* 1.4  --     Liver Function Tests: Recent Labs  Lab 04/26/23 2222  AST 64*  ALT 31  ALKPHOS 95  BILITOT 0.4  PROT 6.4*  ALBUMIN 2.5*   No results for input(s): "LIPASE", "AMYLASE" in the last 168 hours. No results for input(s): "AMMONIA" in the last 168 hours.  ABG    Component Value Date/Time   PHART 7.51 (H) 04/27/2023 1215   PCO2ART 33 04/27/2023 1215   PO2ART 69 (L) 04/27/2023 1215   HCO3 26.3 04/27/2023 1215   ACIDBASEDEF 2.4 (H) 04/27/2023 0150   O2SAT 95.7 04/27/2023 1215     Coagulation Profile: Recent Labs  Lab 04/27/23 2123  INR 1.7*    Cardiac Enzymes: No results for input(s): "CKTOTAL", "CKMB", "CKMBINDEX", "TROPONINI" in  the last 168 hours.  HbA1C: Hemoglobin A1C  Date/Time Value Ref Range Status  07/15/2016 05:31 AM 5.0  Final   Hgb A1c MFr Bld  Date/Time Value Ref Range Status  04/27/2023 01:50 AM 5.2 4.8 - 5.6 % Final    Comment:    (NOTE) Pre diabetes:          5.7%-6.4%  Diabetes:              >6.4%  Glycemic control for   <7.0% adults with diabetes   08/25/2022 03:32 PM 5.6 4.8 - 5.6 % Final    Comment:    (NOTE)         Prediabetes: 5.7 - 6.4         Diabetes: >6.4         Glycemic control for adults with diabetes: <7.0     CBG: Recent Labs  Lab 04/27/23 1107 04/27/23 1609 04/27/23 2009 04/27/23 2332 04/28/23 0346  GLUCAP 144* 139* 154* 135* 136*    Review of Systems:   Unable to assess due to Acute Respiratory Distress   Past Medical History:  She,  has a past medical history of Allergy, Anxiety, Asthma, Chronic pain syndrome, COPD (chronic obstructive pulmonary disease) (HCC), CVA (cerebral infarction), Depression, Fibromyalgia, Headache, Hyperlipidemia, Hypertension, IBS (irritable bowel syndrome), Stroke (HCC), and Vitamin D deficiency.   Surgical History:   Past Surgical History:  Procedure Laterality Date   ABDOMINAL HYSTERECTOMY     APPENDECTOMY     BREAST EXCISIONAL BIOPSY  2011   Pt states lump removed, ? side, no scar seen   BREAST SURGERY  2011   biopsy   CERVICAL DISCECTOMY     CHOLECYSTECTOMY     SINUSOTOMY       Social History:   reports that she has been smoking cigarettes. She started smoking about 59 years ago. She has a 14.3 pack-year smoking history. She has never used smokeless tobacco. She reports that she does not drink alcohol and does not use drugs.   Family History:  Her family history includes Alcohol abuse in her father; Anxiety disorder in her mother; Bipolar disorder in her son; Breast cancer (age of onset: 86) in her mother; Cancer in her father; Depression in her father and mother; Gallbladder disease in her father.    Allergies Allergies  Allergen Reactions   Bextra  [Valdecoxib]    Compazine [Prochlorperazine Edisylate]     Stroke-like symptoms   Lithium Carbonate     Leg weakness   Lyrica [Pregabalin]      Home Medications  Prior to Admission medications   Medication Sig Start Date End Date Taking? Authorizing Provider  acetaminophen (TYLENOL) 500 MG tablet Take 500 mg by mouth 3 (three) times daily as needed for mild pain (pain score 1-3).   Yes [provider]  amiodarone (PACERONE) 200 MG tablet Take 1 tablet (200 mg total) by mouth 2 (two) times daily for 12 days, THEN 1 tablet (200 mg total) daily. 09/13/22 04/25/23 Yes Leeroy Bock, MD  apixaban (ELIQUIS) 5 MG TABS tablet Take 1 tablet (5 mg total) by mouth 2 (two) times daily. 08/30/22 04/25/23 Yes Phineas Semen, MD  atorvastatin (LIPITOR) 40 MG tablet TAKE 1 TABLET BY MOUTH EVERY DAY Patient taking differently: Take 40 mg by mouth at bedtime. TAKE 1 TABLET BY MOUTH EVERY DAY 12/26/21  Yes Karamalegos, Netta Neat, DO  busPIRone (BUSPAR) 10 MG tablet Take 10 mg by mouth 2 (two) times daily. 04/03/23  Yes [provider]  camphor-menthol Wynelle Fanny) lotion Apply 1 Application topically 2 (two) times daily.   Yes [provider]  FLUoxetine (PROZAC) 10 MG tablet Take 15 mg by mouth daily. 04/10/23  Yes [provider]  furosemide (LASIX) 40 MG tablet Take 1 tablet (40 mg total) by mouth daily. If swelling is less, can take only 1 pill. 09/13/22 04/25/23 Yes Leeroy Bock, MD  gabapentin (NEURONTIN) 100 MG capsule Take 1 capsule (100 mg total) by mouth 3 (three) times daily. 09/13/22  Yes Leeroy Bock, MD  guaiFENesin (MUCINEX) 600 MG 12 hr tablet Take 600 mg by mouth every 12 (twelve) hours.   Yes [provider]  lamoTRIgine (LAMICTAL) 25 MG tablet Take 12.5 mg by mouth daily. 04/17/23  Yes [provider]  levothyroxine (SYNTHROID) 25 MCG tablet Take 1 tablet (25 mcg total) by  mouth daily at 6 (six) AM. Patient taking differently: Take 12.5 mcg by mouth daily at 6 (six) AM. 09/14/22  Yes Leeroy Bock, MD  LORazepam (ATIVAN) 0.5 MG tablet Take 0.5 mg by mouth every 8 (eight) hours as needed for anxiety, sleep, seizure or sedation.   Yes [provider]  melatonin 5 MG TABS Take 1 tablet (5 mg total) by mouth at bedtime. 09/13/22  Yes Leeroy Bock, MD  Menthol-Zinc Oxide (CALMOSEPTINE) 0.44-20.6 % OINT Apply 1 application  topically as directed. Apply to area at every shift change 09/23/22  Yes [provider]  metoprolol tartrate (LOPRESSOR) 25 MG tablet Take 0.5 tablets (12.5 mg total) by mouth 2 (two) times daily. 09/13/22  Yes Leeroy Bock, MD  mirtazapine (REMERON) 15 MG tablet Take 15 mg by mouth at bedtime. 04/02/23  Yes [provider]  montelukast (SINGULAIR) 10 MG tablet TAKE 1 TABLET(10 MG) BY MOUTH AT BEDTIME 09/10/22  Yes Karamalegos, Netta Neat, DO  [facility] powder Apply 1 Application topically 2 (two) times daily. 04/22/23  Yes [provider]  polyethylene glycol (MIRALAX / GLYCOLAX) 17 g packet Take 17 g by mouth daily as needed for mild constipation. 09/13/22  Yes Leeroy Bock, MD  traMADol (ULTRAM) 50 MG tablet Take 50 mg by mouth 3 (three) times daily as needed for moderate pain (pain score 4-6). 03/10/23  Yes [provider]  traZODone (DESYREL) 50 MG tablet Take  1 tablet (50 mg total) by mouth at bedtime. 09/13/22  Yes Leeroy Bock, MD  TRELEGY ELLIPTA 200-62.5-25 MCG/ACT AEPB INHALE 1 PUFF INTO THE LUNGS DAILY 08/14/22  Yes Salena Saner, MD  albuterol (VENTOLIN HFA) 108 (90 Base) MCG/ACT inhaler Inhale 1-2 puffs into the lungs every 6 (six) hours as needed for wheezing or shortness of breath. Patient not taking: Reported on 04/25/2023 02/06/22   Smitty Cords, DO     Critical care time: 55 minutes     Harlon Ditty, AGACNP-BC [facility] Prefer epic messenger for cross cover needs If after hours, please call E-link

## 2023-04-28 NOTE — Progress Notes (Signed)
 PHARMACY CONSULT NOTE - FOLLOW UP  Pharmacy Consult for Electrolyte Monitoring and Replacement   Recent Labs: Potassium (mmol/L)  Date Value  04/28/2023 4.8  12/26/2012 3.3 (L)   Magnesium (mg/dL)  Date Value  16/10/9602 2.2  12/25/2012 1.8   Calcium (mg/dL)  Date Value  54/09/8117 9.0   Calcium, Total (mg/dL)  Date Value  14/78/2956 9.1   Albumin (g/dL)  Date Value  21/30/8657 2.5 (L)  03/23/2015 3.6  12/20/2012 3.5   Phosphorus (mg/dL)  Date Value  84/69/6295 4.5   Sodium (mmol/L)  Date Value  04/28/2023 141  01/01/2021 143  12/26/2012 144     Assessment: 78 yo F with PMH including CVA, Afib (on Eliquis) presenting with hypokalemia. Per patient, recently experienced diarrhea for a few days due to taking antibiotic for pneumonia. QTc on ECG this morning was 648. Pharmacy has been consulted to manage electrolytes.  Goal of Therapy:  Potassium 4.0 - 5.1 mmol/L Magnesium 2.0 - 2.4 mg/dL All Other Electrolytes WNL  Plan: ---no electrolyte replacement warranted for today ---recheck electrolytes in am  Lowella Bandy, PharmD Clinical Pharmacist 04/28/2023 7:04 AM

## 2023-04-28 NOTE — IPAL (Signed)
  Interdisciplinary Goals of Care Family Meeting   Date carried out: 04/28/2023  Location of the meeting: Bedside  Member's involved: Physician and Family Member or next of kin  Durable Power of Attorney or acting medical decision maker: Patient's Son, [name].  Discussion: We discussed goals of care for Leland Johns. I met with Mr. An at the bedside today, and explained that his mother suffered acute respiratory failure likely secondary to pulmonary edema with arrhythmia. Explained that she had to have a breathing tube placed to support her breathing. Explained plan for care moving forward, with goal to optimize respiratory condition before working on liberation from the ventilation. All of Mr. Shaul's questions were answered.  Code status:   Code Status: Full Code   Disposition: Continue current acute care  Time spent for the meeting: 20 minutes    Raechel Chute, MD  04/28/2023, 3:09 PM

## 2023-04-28 NOTE — Procedures (Signed)
 Intubation Procedure Note  GRIZEL VESELY  [id number]  11/22/1945  Date:04/28/23  Time:8:56 AM   Provider Performing:Audray Rumore D Elvina Sidle    Procedure: Intubation (31500)  Indication(s) Respiratory Failure  Consent Unable to obtain consent due to emergent nature of procedure.   Anesthesia Etomidate and Rocuronium   Time Out Verified patient identification, verified procedure, site/side was marked, verified correct patient position, special equipment/implants available, medications/allergies/relevant history reviewed, required imaging and test results available.   Sterile Technique Usual hand hygeine, masks, and gloves were used   Procedure Description Patient positioned in bed supine.  Sedation given as noted above.  Patient was intubated with endotracheal tube using Glidescope.  View was Grade 1 full glottis .  Number of attempts was 1.  Colorimetric CO2 detector was consistent with tracheal placement.   Complications/Tolerance None; patient tolerated the procedure well. Chest X-ray is ordered to verify placement.   EBL Minimal   Specimen(s) None   Size 7.5 ETT Tube secured at 23 cm at lip   Harlon Ditty, AGACNP-BC [facility] Prefer epic messenger for cross cover needs If after hours, please call E-link

## 2023-04-29 ENCOUNTER — Inpatient Hospital Stay

## 2023-04-29 DIAGNOSIS — U071 COVID-19: Secondary | ICD-10-CM

## 2023-04-29 DIAGNOSIS — J810 Acute pulmonary edema: Secondary | ICD-10-CM | POA: Diagnosis not present

## 2023-04-29 DIAGNOSIS — J9622 Acute and chronic respiratory failure with hypercapnia: Secondary | ICD-10-CM | POA: Diagnosis not present

## 2023-04-29 DIAGNOSIS — J9621 Acute and chronic respiratory failure with hypoxia: Secondary | ICD-10-CM | POA: Diagnosis not present

## 2023-04-29 LAB — GLUCOSE, CAPILLARY
Glucose-Capillary: 147 mg/dL — ABNORMAL HIGH (ref 70–99)
Glucose-Capillary: 136 mg/dL — ABNORMAL HIGH (ref 70–99)
Glucose-Capillary: 134 mg/dL — ABNORMAL HIGH (ref 70–99)
Glucose-Capillary: 119 mg/dL — ABNORMAL HIGH (ref 70–99)
Glucose-Capillary: 122 mg/dL — ABNORMAL HIGH (ref 70–99)
Glucose-Capillary: 116 mg/dL — ABNORMAL HIGH (ref 70–99)

## 2023-04-29 LAB — RENAL FUNCTION PANEL
Albumin: 2.4 g/dL — ABNORMAL LOW (ref 3.5–5.0)
Anion gap: 8 (ref 5–15)
BUN: 16 mg/dL (ref 8–23)
CO2: 26 mmol/L (ref 22–32)
Calcium: 8.4 mg/dL — ABNORMAL LOW (ref 8.9–10.3)
Chloride: 108 mmol/L (ref 98–111)
Creatinine, Ser: 0.94 mg/dL (ref 0.44–1.00)
GFR, Estimated: >60 mL/min (ref >60)
Glucose, Bld: 139 mg/dL — ABNORMAL HIGH (ref 70–99)
Phosphorus: 3.1 mg/dL (ref 2.5–4.6)
Potassium: 3.5 mmol/L (ref 3.5–5.1)
Sodium: 142 mmol/L (ref 135–145)

## 2023-04-29 LAB — CBC
HCT: 29.7 % — ABNORMAL LOW (ref 36.0–46.0)
Hemoglobin: 9.7 g/dL — ABNORMAL LOW (ref 12.0–15.0)
MCH: 29.8 pg (ref 26.0–34.0)
MCHC: 32.7 g/dL (ref 30.0–36.0)
MCV: 91.4 fL (ref 80.0–100.0)
Platelets: 279 10*3/uL (ref 150–400)
RBC: 3.25 MIL/uL — ABNORMAL LOW (ref 3.87–5.11)
RDW: 16.2 % — ABNORMAL HIGH (ref 11.5–15.5)
WBC: 20.1 10*3/uL — ABNORMAL HIGH (ref 4.0–10.5)
nRBC: 0 % (ref 0.0–0.2)

## 2023-04-29 LAB — BASIC METABOLIC PANEL WITH GFR
Anion gap: 10 (ref 5–15)
BUN: 16 mg/dL (ref 8–23)
CO2: 27 mmol/L (ref 22–32)
Calcium: 7.8 mg/dL — ABNORMAL LOW (ref 8.9–10.3)
Chloride: 105 mmol/L (ref 98–111)
Creatinine, Ser: 0.84 mg/dL (ref 0.44–1.00)
GFR, Estimated: >60 mL/min (ref >60)
Glucose, Bld: 119 mg/dL — ABNORMAL HIGH (ref 70–99)
Potassium: 3.9 mmol/L (ref 3.5–5.1)
Sodium: 142 mmol/L (ref 135–145)

## 2023-04-29 LAB — TRIGLYCERIDES: Triglycerides: 105 mg/dL (ref <150)

## 2023-04-29 LAB — TROPONIN I (HIGH SENSITIVITY): Troponin I (High Sensitivity): 340 ng/L (ref <18)

## 2023-04-29 LAB — MAGNESIUM: Magnesium: 2.4 mg/dL (ref 1.7–2.4)

## 2023-04-29 MED ORDER — SODIUM CHLORIDE 0.9 % WEIGHT BASED INFUSION: 3.0000 mL/kg/h | INTRAVENOUS | Status: DC

## 2023-04-29 MED ORDER — ORAL CARE MOUTH RINSE: 15.0000 mL | OROMUCOSAL | Status: DC | PRN

## 2023-04-29 MED ORDER — AMIODARONE IV BOLUS ONLY 150 MG/100ML
150.0000 mg | Freq: Once | INTRAVENOUS | Status: AC
  Administered 2023-04-29: 150 mg via INTRAVENOUS
  Filled 2023-04-29: qty 100

## 2023-04-29 MED ORDER — AMIODARONE HCL IN DEXTROSE 360-4.14 MG/200ML-% IV SOLN
60.0000 mg/h | INTRAVENOUS | Status: DC
  Administered 2023-04-29: 60 mg/h via INTRAVENOUS
  Filled 2023-04-29: qty 200

## 2023-04-29 MED ORDER — SODIUM CHLORIDE 0.9 % IV SOLN: 250.0000 mL | INTRAVENOUS | Status: DC | PRN

## 2023-04-29 MED ORDER — POTASSIUM CHLORIDE CRYS ER 20 MEQ PO TBCR: 40.0000 meq | EXTENDED_RELEASE_TABLET | Freq: Once | ORAL | Status: DC

## 2023-04-29 MED ORDER — SODIUM CHLORIDE 0.9% FLUSH
3.0000 mL | Freq: Two times a day (BID) | INTRAVENOUS | Status: DC
  Administered 2023-04-30: 3 mL via INTRAVENOUS

## 2023-04-29 MED ORDER — FENTANYL 2500MCG IN NS 250ML (10MCG/ML) PREMIX INFUSION
25.0000 ug/h | INTRAVENOUS | Status: DC
  Administered 2023-04-29: 25 ug/h via INTRAVENOUS
  Filled 2023-04-29: qty 250

## 2023-04-29 MED ORDER — SODIUM CHLORIDE 0.9 % WEIGHT BASED INFUSION: 1.0000 mL/kg/h | INTRAVENOUS | Status: DC

## 2023-04-29 MED ORDER — SODIUM CHLORIDE 0.9 % WEIGHT BASED INFUSION
1.0000 mL/kg/h | INTRAVENOUS | Status: DC
Start: 2023-04-30 — End: 2023-04-30
  Administered 2023-04-30: 1 mL/kg/h via INTRAVENOUS

## 2023-04-29 MED ORDER — AMIODARONE HCL IN DEXTROSE 360-4.14 MG/200ML-% IV SOLN: 30.0000 mg/h | INTRAVENOUS | Status: DC

## 2023-04-29 MED ORDER — FUROSEMIDE 10 MG/ML IJ SOLN
80.0000 mg | Freq: Once | INTRAMUSCULAR | Status: AC
  Administered 2023-04-29: 80 mg via INTRAVENOUS
  Filled 2023-04-29: qty 8

## 2023-04-29 MED ORDER — FENTANYL BOLUS VIA INFUSION
25.0000 ug | INTRAVENOUS | Status: DC | PRN
  Administered 2023-04-29 (×4): 100 ug via INTRAVENOUS

## 2023-04-29 MED ORDER — AMIODARONE HCL IN DEXTROSE 360-4.14 MG/200ML-% IV SOLN
60.0000 mg/h | INTRAVENOUS | Status: AC
  Administered 2023-04-29: 60 mg/h via INTRAVENOUS

## 2023-04-29 MED ORDER — AMIODARONE HCL IN DEXTROSE 360-4.14 MG/200ML-% IV SOLN
30.0000 mg/h | INTRAVENOUS | Status: AC
  Administered 2023-04-29 – 2023-05-01 (×6): 30 mg/h via INTRAVENOUS
  Filled 2023-04-29 (×4): qty 200

## 2023-04-29 MED ORDER — POTASSIUM CHLORIDE 20 MEQ PO PACK
40.0000 meq | PACK | Freq: Once | ORAL | Status: AC
Start: 2023-04-29 — End: 2023-04-29
  Administered 2023-04-29: 40 meq
  Filled 2023-04-29: qty 2

## 2023-04-29 MED ORDER — SODIUM CHLORIDE 0.9 % WEIGHT BASED INFUSION
3.0000 mL/kg/h | INTRAVENOUS | Status: DC
  Administered 2023-04-30: 3 mL/kg/h via INTRAVENOUS

## 2023-04-29 MED ORDER — FENTANYL CITRATE PF 50 MCG/ML IJ SOSY: 25.0000 ug | PREFILLED_SYRINGE | Freq: Once | INTRAMUSCULAR | Status: DC

## 2023-04-29 MED ORDER — SODIUM CHLORIDE 0.9% FLUSH: 3.0000 mL | INTRAVENOUS | Status: DC | PRN

## 2023-04-29 NOTE — Progress Notes (Signed)
 Pt. Extubated to 3lnc without incident,hr 63,rr18,sat 100.

## 2023-04-29 NOTE — Progress Notes (Signed)
 NAME:  KAZUE CERRO, MRN:  [id number], DOB:  1945-03-22, LOS: 4 ADMISSION DATE:  04/25/2023, CONSULTATION DATE:  04/28/23 REFERRING MD:  Dr. Renae Gloss, CHIEF COMPLAINT:  Acute Respiratory Distress   Brief Pt Description / Synopsis:  78 y.o. female with PMHx significant for CVA with left sided weakness, COPD requiring 3L supplemental O2 at baseline, A. Fib on Eliquis, depression and anxiety presenting with suicidal ideation and E. Coli UTI.  Hospital course complicated by Acute on Chronic Hypoxic & Hypercapnic Respiratory Failure in the setting of flash pulmonary edema, AECOPD, and questionable worsening multifocal pneumonia, along with SVT and Ventricular Tachycardia which converted with Amiodarone bolus x2.  Failed trial of BiPAP requiring intubation and mechanical ventilation.  History of Present Illness:  78 y.o. female with medical history significant for CVA with left-sided residual weakness, depression, anxiety, neuropathy, insomnia, atrial fibrillation on Eliquis, COPD on home O2 at 3 L who was brought to the ED after she tried to stab herself with scissors.  She endorses feeling suicidal.   Pt currently unable to contribute to history due to Acute Respiratory Distress and BiPAP, therefore history is obtained from chart review.    Per H&P from Hospitalist, she recently recovered from a bout of pneumonia, completing antibiotic treatment. She did endorse diarrhea in the few days prior. She has had very poor oral intake.   ED Course: Initial Vital Signs: BP slightly elevated on arrival at 170/72, pulse in the 50s otherwise normal vitals  Significant Labs:  potassium of 2.2 and magnesium 1.6.  TSH slightly elevated at 5.528.  Free T4 also elevated at 1.66 Urinalysis with moderate leukocytes CBC WNL, BMP except for potassium WNL. UDS, salicylate, EtOH and acetaminophen levels within normal parameters EKG, personally reviewed and interpreted showing sinus bradycardia at 51 and prolonged QT to  648 Imaging Chest X-ray>> Medications Administered: Patient was started on ceftriaxone for possibility of UTI.  Also given oral and IV potassium repletion.   Seen by behavioral health,  Hospitalist consulted for admission.   Please see "Significant Hospital Events" section below for full detailed hospital course.   Pertinent  Medical History   Past Medical History:  Diagnosis Date   Allergy    Anxiety    Asthma    Chronic pain syndrome    discharged from pain clinic, hx of narcotics seeking behavior   COPD (chronic obstructive pulmonary disease) (HCC)    CVA (cerebral infarction)    Depression    Fibromyalgia    Headache    Hyperlipidemia    Hypertension    IBS (irritable bowel syndrome)    Stroke (HCC)    Vitamin D deficiency     Micro Data:  3/27: Urine>> E. Coli 3/28: GI panel & C.Diff PCR>> negative 3/29: SARS-CoV-2/Flu/RSV PCR>> negative 3/29: Blood cultures x2>> no growth to date 3/30: MRSA PCR>> negative 3/31: Tracheal aspirate>> 3/31: RVP>>+ Coronavirus Oc43 3/31: Strep pneumo urinary antigen>>negative 3/31: Legionella urinary antigen>>  Antimicrobials:   Anti-infectives (From admission, onward)    Start     Dose/Rate Route Frequency Ordered Stop   04/28/23 0030  vancomycin (VANCOREADY) IVPB 750 mg/150 mL  Status:  Discontinued        750 mg 150 mL/hr over 60 Minutes Intravenous Every 24 hours 04/27/23 0008 04/27/23 1040   04/27/23 0100  vancomycin (VANCOREADY) IVPB 1250 mg/250 mL        1,250 mg 166.7 mL/hr over 90 Minutes Intravenous  Once 04/27/23 0001 04/27/23 0201   04/27/23 0100  piperacillin-tazobactam (ZOSYN) IVPB 3.375 g        3.375 g 12.5 mL/hr over 240 Minutes Intravenous Every 8 hours 04/27/23 0001     04/25/23 2330  cefTRIAXone (ROCEPHIN) 1 g in sodium chloride 0.9 % 100 mL IVPB  Status:  Discontinued        1 g 200 mL/hr over 30 Minutes Intravenous Every 24 hours 04/25/23 1216 04/26/23 2347   04/25/23 0115  cefTRIAXone (ROCEPHIN) 1 g  in sodium chloride 0.9 % 100 mL IVPB        1 g 200 mL/hr over 30 Minutes Intravenous  Once 04/25/23 0100 04/25/23 0155       Significant Hospital Events: Including procedures, antibiotic start and stop dates in addition to other pertinent events   3/28.  Admitted by [facility] Potassium still low this morning IV and oral potassium ordered.  3/29.  Psychiatry changed her medications over to Remeron at night secondary to prolonged QT.  Electrolytes much improved.  Patient felt short of breath this morning.  Chest x-ray showing overall improvement in bilateral airspace opacities from prior studies. Patient had worsening shortness of breath and was acidotic and sent to the ICU for BiPAP and started on aggressive antibiotics and steroids.  3/30.  Patient did not like the BiPAP mask.  Placed back on nasal cannula.  On IV fluids.  Continue Zosyn.  Discontinue vancomycin with MRSA PCR being negative.  Continue Solu-Medrol. 3/31: Developed severe acute respiratory distress (suspecting flash pulmonary edema and AECOPD) and SVT/ V. Tach requiring intubation and mechanical ventilation.  Bolused with Amiodarone x2, Cardiology consulted, Echocardiogram pending. 4/1: On minimal vent settings, awake and alert, following commands. Episode of A.fib w/ RVR which converted back to NSR with Amiodarone bolus and resumption of Amiodarone gtt.  RVP + for Coronavirus OC43.  Pulmonary edema improved on X-ray.  Diurese with 80 mg Lasix x1 dose and then perform SBT as tolerated ~  EXTUBATED.  Interim History / Subjective:  As outlined above under significant hospital events section  Objective   Blood pressure (!) 115/49, pulse 63, temperature 98.7 F (37.1 C), temperature source Axillary, resp. rate 13, height 5\' 5"  (1.651 m), weight 54.4 kg, SpO2 93%.    Vent Mode: PSV FiO2 (%):  [28 %-60 %] 28 % Set Rate:  [20 bmp-24 bmp] 24 bmp Vt Set:  [400 mL] 400 mL PEEP:  [5 cmH20-8 cmH20] 5 cmH20 Pressure Support:  [5 cmH20] 5  cmH20 Plateau Pressure:  [17 cmH20-20 cmH20] 20 cmH20   Intake/Output Summary (Last 24 hours) at 04/29/2023 7829 Last data filed at 04/29/2023 5621 Gross per 24 hour  Intake 974.97 ml  Output 2115 ml  Net -1140.03 ml   Filed Weights   04/24/23 2246  Weight: 54.4 kg    Examination: General: Critically ill-appearing female, laying in bed, on BiPAP, with severe respiratory distress HENT: Atraumatic, normocephalic, neck supple, positive JVD Lungs: Coarse breath sounds with expiratory wheezing throughout, labored, accessory muscle use, tachypnea Cardiovascular: Tachycardia, regular rhythm, no murmurs, rubs, gallops Abdomen: Soft, nontender, nondistended, no guarding or tenderness, bowel sounds positive x 4 Extremities: Normal bulk and tone, no deformities, trace edema bilateral lower extremities Neuro: Awake and alert, difficult to assess orientation due to respiratory distress and BiPAP GU: External female catheter in place  Resolved Hospital Problem list     Assessment & Plan:   #Acute on Chronic Hypoxic & Hypercapnic Respiratory Failure in the setting of ... #Flash Pulmonary Edema #Acute COPD Exacerbation #Coronavirus OC43 infection #  Multifocal Pneumonia PMHx: Pulmonary HTN, Asthma -Full vent support, implement lung protective strategies -Plateau pressures less than 30 cm H20 -Wean FiO2 & PEEP as tolerated to maintain O2 sats >92% -Follow intermittent Chest X-ray & ABG as needed -Spontaneous Breathing Trials when respiratory parameters met and mental status permits -Implement VAP Bundle -Bronchodilators -IV Steroids (Solumedrol 40 mg BID) -ABX as above -Diuresis as BP and renal function permits ~ will give 80 mg IV Lasix x1 dose on 4/1  #SVT & Ventricular Tachycardia ~ converted to Sinus Tachycardia #Acute on Chronic HFpEF #Paroxsymal Atrial Fibrillation  #Elevated Troponin in setting of demand ischemia vs NSTEMI PMHx: Atrial Fibrillation on Eliquis Echocardiogram  09/09/22: LVEF 70-75%, mild LVH, unable to evaluate diastolic parameters.  Pulmonary artery pressure moderately to severely elevated.  RV systolic function and size normal.  Moderate to severe Tricuspid valve regurgitation. -Continuous cardiac monitoring -Maintain MAP >65 -Cautious IV fluids -Vasopressors as needed to maintain MAP goal -Trend HS Troponin until peaked -Echocardiogram pending -Cardiology consulted, appreciate input -Diuresis as BP and renal function permits ~ will give 80 mg IV Lasix x1 dose on 4/1 -Amiodarone 150 mg bolus x2 with conversion to Sinus Tach ~ continue Amiodarone gtt as per Cardiology -Continue Eliquis ~ may need to transition over to Heparin gtt if unable to tolerate PO  #Meets SIRS Criteria (HR 170s, RR 30's, WBC 15.8) #Severe Sepsis due to ... #Multifocal Pneumonia #E. Coli UTI -Monitor fever curve -Trend WBC's & Procalcitonin -Follow cultures as above -Continue empiric Zosyn pending cultures & sensitivities  #Normocytic Normochromic Anemia without s/sx of bleeding -Monitor for S/Sx of bleeding -Trend CBC -Eliquis for AC/VTE Prophylaxis  -Transfuse for Hgb <7  #Hypothyroidism -Continue Synthroid  #Hyperglycemia -CBG's q4h; Target range of 140 to 180 -SSI -Follow ICU Hypo/Hyperglycemia protocol  #Sedation needs in setting of mechanical ventilation #Suicidal Ideation #Depression -Maintain a RASS goal of 0 to -1 -Fentanyl and Propofol as needed to maintain RASS goal -Avoid sedating medications as able -Daily wake up assessment -Was seen by Psychiatry, recommended continuing 1:1 sitter and IVC with reassessment for inpatient BH admit ~ Once extubated will need to reinitiate Safety Suicide sitter and repeat Psych evaluation       Pt is critically ill with multiorgan failure. Prognosis is guarded, high risk for further decompensation, cardiac arrest, and death.   Given current critical illness superimposed on multiple chronic co-morbidities  and advanced age, overall long term prognosis is poor.  Anticipate she may be difficult to liberate from mechanical ventilation due to severe COPD at baseline. Recommend consideration of DNR/DNI status.   Best Practice (right click and "Reselect all SmartList Selections" daily)   Diet/type: NPO DVT prophylaxis: DOAC GI prophylaxis: PPI Lines: N/A Foley:  N/A Code Status:  full code Last date of multidisciplinary goals of care discussion [4/1]  4/1: Pt's son updated at bedside by Dr. Aundria Rud on plan of care.  Labs   CBC: Recent Labs  Lab 04/24/23 2321 04/26/23 2222 04/28/23 0408 04/29/23 0426  WBC 9.4 15.9* 15.8* 20.1*  NEUTROABS  --  8.0*  --   --   HGB 13.1 13.2 9.6* 9.7*  HCT 40.3 42.1 29.5* 29.7*  MCV 94.4 95.9 92.8 91.4  PLT 279 325 176 279    Basic Metabolic Panel: Recent Labs  Lab 04/25/23 1534 04/26/23 0504 04/26/23 2222 04/27/23 0149 04/27/23 2123 04/28/23 0408 04/28/23 2214 04/29/23 0426  NA 142 141   < > 143 140 141 140 142  K 4.0 4.0   < >  4.3 4.5 4.8 3.6 3.5  CL 108 111   < > 111 109 107 107 108  CO2 24 25   < > 21* 23 26 24 26   GLUCOSE 144* 104*   < > 174* 134* 139* 155* 139*  BUN 12 10   < > 13 14 14 16 16   CREATININE 0.97 0.68   < > 0.89 0.79 0.72 0.90 0.94  CALCIUM 8.3* 8.3*   < > 8.4* 8.7* 9.0 8.5* 8.4*  MG 2.0 2.0   < > 1.8 2.2 2.2 2.6* 2.4  PHOS 1.4* 2.7  --  4.5  --   --  3.0 3.1   < > = values in this interval not displayed.   GFR: Estimated Creatinine Clearance: 41.7 mL/min (by C-G formula based on SCr of 0.94 mg/dL). Recent Labs  Lab 04/24/23 2321 04/26/23 0504 04/26/23 2222 04/27/23 0150 04/27/23 1202 04/28/23 0408 04/29/23 0426  PROCALCITON  --  <0.10 <0.10  --   --   --   --   WBC 9.4  --  15.9*  --   --  15.8* 20.1*  LATICACIDVEN  --   --  6.3* 4.4* 1.4  --   --     Liver Function Tests: Recent Labs  Lab 04/26/23 2222 04/29/23 0426  AST 64*  --   ALT 31  --   ALKPHOS 95  --   BILITOT 0.4  --   PROT 6.4*  --    ALBUMIN 2.5* 2.4*   No results for input(s): "LIPASE", "AMYLASE" in the last 168 hours. No results for input(s): "AMMONIA" in the last 168 hours.  ABG    Component Value Date/Time   PHART 7.42 04/28/2023 1215   PCO2ART 42 04/28/2023 1215   PO2ART 162 (H) 04/28/2023 1215   HCO3 27.2 04/28/2023 1215   ACIDBASEDEF 3.0 (H) 04/28/2023 0958   O2SAT 99.2 04/28/2023 1215     Coagulation Profile: Recent Labs  Lab 04/27/23 2123  INR 1.7*    Cardiac Enzymes: No results for input(s): "CKTOTAL", "CKMB", "CKMBINDEX", "TROPONINI" in the last 168 hours.  HbA1C: Hemoglobin A1C  Date/Time Value Ref Range Status  07/15/2016 05:31 AM 5.0  Final   Hgb A1c MFr Bld  Date/Time Value Ref Range Status  04/27/2023 01:50 AM 5.2 4.8 - 5.6 % Final    Comment:    (NOTE) Pre diabetes:          5.7%-6.4%  Diabetes:              >6.4%  Glycemic control for   <7.0% adults with diabetes   08/25/2022 03:32 PM 5.6 4.8 - 5.6 % Final    Comment:    (NOTE)         Prediabetes: 5.7 - 6.4         Diabetes: >6.4         Glycemic control for adults with diabetes: <7.0     CBG: Recent Labs  Lab 04/28/23 1626 04/28/23 1943 04/28/23 2320 04/29/23 0328 04/29/23 0735  GLUCAP 108* 143* 156* 147* 136*    Review of Systems:   Unable to assess due to intubation   Past Medical History:  She,  has a past medical history of Allergy, Anxiety, Asthma, Chronic pain syndrome, COPD (chronic obstructive pulmonary disease) (HCC), CVA (cerebral infarction), Depression, Fibromyalgia, Headache, Hyperlipidemia, Hypertension, IBS (irritable bowel syndrome), Stroke (HCC), and Vitamin D deficiency.   Surgical History:   Past Surgical History:  Procedure Laterality Date  ABDOMINAL HYSTERECTOMY     APPENDECTOMY     BREAST EXCISIONAL BIOPSY  2011   Pt states lump removed, ? side, no scar seen   BREAST SURGERY  2011   biopsy   CERVICAL DISCECTOMY     CHOLECYSTECTOMY     SINUSOTOMY       Social History:    reports that she has been smoking cigarettes. She started smoking about 59 years ago. She has a 14.3 pack-year smoking history. She has never used smokeless tobacco. She reports that she does not drink alcohol and does not use drugs.   Family History:  Her family history includes Alcohol abuse in her father; Anxiety disorder in her mother; Bipolar disorder in her son; Breast cancer (age of onset: 49) in her mother; Cancer in her father; Depression in her father and mother; Gallbladder disease in her father.   Allergies Allergies  Allergen Reactions   Bextra  [Valdecoxib]    Compazine [Prochlorperazine Edisylate]     Stroke-like symptoms   Lithium Carbonate     Leg weakness   Lyrica [Pregabalin]      Home Medications  Prior to Admission medications   Medication Sig Start Date End Date Taking? Authorizing Provider  acetaminophen (TYLENOL) 500 MG tablet Take 500 mg by mouth 3 (three) times daily as needed for mild pain (pain score 1-3).   Yes [provider]  amiodarone (PACERONE) 200 MG tablet Take 1 tablet (200 mg total) by mouth 2 (two) times daily for 12 days, THEN 1 tablet (200 mg total) daily. 09/13/22 04/25/23 Yes Leeroy Bock, MD  apixaban (ELIQUIS) 5 MG TABS tablet Take 1 tablet (5 mg total) by mouth 2 (two) times daily. 08/30/22 04/25/23 Yes Phineas Semen, MD  atorvastatin (LIPITOR) 40 MG tablet TAKE 1 TABLET BY MOUTH EVERY DAY Patient taking differently: Take 40 mg by mouth at bedtime. TAKE 1 TABLET BY MOUTH EVERY DAY 12/26/21  Yes Karamalegos, Netta Neat, DO  busPIRone (BUSPAR) 10 MG tablet Take 10 mg by mouth 2 (two) times daily. 04/03/23  Yes [provider]  camphor-menthol Wynelle Fanny) lotion Apply 1 Application topically 2 (two) times daily.   Yes [provider]  FLUoxetine (PROZAC) 10 MG tablet Take 15 mg by mouth daily. 04/10/23  Yes [provider]  furosemide (LASIX) 40 MG tablet Take 1 tablet (40 mg total) by mouth daily. If swelling  is less, can take only 1 pill. 09/13/22 04/25/23 Yes Leeroy Bock, MD  gabapentin (NEURONTIN) 100 MG capsule Take 1 capsule (100 mg total) by mouth 3 (three) times daily. 09/13/22  Yes Leeroy Bock, MD  guaiFENesin (MUCINEX) 600 MG 12 hr tablet Take 600 mg by mouth every 12 (twelve) hours.   Yes [provider]  lamoTRIgine (LAMICTAL) 25 MG tablet Take 12.5 mg by mouth daily. 04/17/23  Yes [provider]  levothyroxine (SYNTHROID) 25 MCG tablet Take 1 tablet (25 mcg total) by mouth daily at 6 (six) AM. Patient taking differently: Take 12.5 mcg by mouth daily at 6 (six) AM. 09/14/22  Yes Leeroy Bock, MD  LORazepam (ATIVAN) 0.5 MG tablet Take 0.5 mg by mouth every 8 (eight) hours as needed for anxiety, sleep, seizure or sedation.   Yes [provider]  melatonin 5 MG TABS Take 1 tablet (5 mg total) by mouth at bedtime. 09/13/22  Yes Leeroy Bock, MD  Menthol-Zinc Oxide (CALMOSEPTINE) 0.44-20.6 % OINT Apply 1 application  topically as directed. Apply to area at every  shift change 09/23/22  Yes [provider]  metoprolol tartrate (LOPRESSOR) 25 MG tablet Take 0.5 tablets (12.5 mg total) by mouth 2 (two) times daily. 09/13/22  Yes Leeroy Bock, MD  mirtazapine (REMERON) 15 MG tablet Take 15 mg by mouth at bedtime. 04/02/23  Yes [provider]  montelukast (SINGULAIR) 10 MG tablet TAKE 1 TABLET(10 MG) BY MOUTH AT BEDTIME 09/10/22  Yes Karamalegos, Netta Neat, DO  [facility] powder Apply 1 Application topically 2 (two) times daily. 04/22/23  Yes [provider]  polyethylene glycol (MIRALAX / GLYCOLAX) 17 g packet Take 17 g by mouth daily as needed for mild constipation. 09/13/22  Yes Leeroy Bock, MD  traMADol (ULTRAM) 50 MG tablet Take 50 mg by mouth 3 (three) times daily as needed for moderate pain (pain score 4-6). 03/10/23  Yes [provider]  traZODone (DESYREL) 50 MG tablet Take 1 tablet (50 mg total) by mouth  at bedtime. 09/13/22  Yes Leeroy Bock, MD  TRELEGY ELLIPTA 200-62.5-25 MCG/ACT AEPB INHALE 1 PUFF INTO THE LUNGS DAILY 08/14/22  Yes Salena Saner, MD  albuterol (VENTOLIN HFA) 108 (90 Base) MCG/ACT inhaler Inhale 1-2 puffs into the lungs every 6 (six) hours as needed for wheezing or shortness of breath. Patient not taking: Reported on 04/25/2023 02/06/22   Smitty Cords, DO     Critical care time: 40 minutes     Harlon Ditty, AGACNP-BC [facility] Prefer epic messenger for cross cover needs If after hours, please call E-link

## 2023-04-29 NOTE — Progress Notes (Signed)
 PHARMACY CONSULT NOTE - FOLLOW UP  Pharmacy Consult for Electrolyte Monitoring and Replacement   Recent Labs: Potassium (mmol/L)  Date Value  04/29/2023 3.5  12/26/2012 3.3 (L)   Magnesium (mg/dL)  Date Value  13/08/6576 2.4  12/25/2012 1.8   Calcium (mg/dL)  Date Value  46/96/2952 8.4 (L)   Calcium, Total (mg/dL)  Date Value  84/13/2440 9.1   Albumin (g/dL)  Date Value  11/24/2534 2.4 (L)  03/23/2015 3.6  12/20/2012 3.5   Phosphorus (mg/dL)  Date Value  64/40/3474 3.1   Sodium (mmol/L)  Date Value  04/29/2023 142  01/01/2021 143  12/26/2012 144     Assessment: 78 yo F with PMH including CVA, Afib (on Eliquis) presenting with hypokalemia. Per patient, recently experienced diarrhea for a few days due to taking antibiotic for pneumonia. QTc on ECG this morning was 648. Pharmacy has been consulted to manage electrolytes.  Goal of Therapy:  Potassium 4.0 - 5.1 mmol/L Magnesium 2.0 - 2.4 mg/dL All Other Electrolytes WNL  Plan: ---40 mEq Kcl per tube x 1 ---recheck electrolytes in am  Lowella Bandy, PharmD Clinical Pharmacist 04/29/2023 7:04 AM

## 2023-04-29 NOTE — Plan of Care (Signed)
  Problem: Education: Goal: Knowledge of General Education information will improve Description: Including pain rating scale, medication(s)/side effects and non-pharmacologic comfort measures Outcome: Progressing   Problem: Health Behavior/Discharge Planning: Goal: Ability to manage health-related needs will improve Outcome: Progressing   Problem: Clinical Measurements: Goal: Ability to maintain clinical measurements within normal limits will improve Outcome: Progressing Goal: Will remain free from infection Outcome: Progressing Goal: Diagnostic test results will improve Outcome: Progressing Goal: Respiratory complications will improve Outcome: Progressing   Problem: Activity: Goal: Risk for activity intolerance will decrease Outcome: Progressing   Problem: Nutrition: Goal: Adequate nutrition will be maintained Outcome: Progressing   Problem: Coping: Goal: Level of anxiety will decrease Outcome: Progressing   Problem: Elimination: Goal: Will not experience complications related to bowel motility Outcome: Progressing Goal: Will not experience complications related to urinary retention Outcome: Progressing   Problem: Pain Managment: Goal: General experience of comfort will improve and/or be controlled Outcome: Progressing   Problem: Safety: Goal: Ability to remain free from injury will improve Outcome: Progressing   Problem: Skin Integrity: Goal: Risk for impaired skin integrity will decrease Outcome: Progressing   Problem: Education: Goal: Ability to describe self-care measures that may prevent or decrease complications (Diabetes Survival Skills Education) will improve Outcome: Progressing Goal: Individualized Educational Video(s) Outcome: Progressing   Problem: Coping: Goal: Ability to adjust to condition or change in health will improve Outcome: Progressing   Problem: Fluid Volume: Goal: Ability to maintain a balanced intake and output will  improve Outcome: Progressing   Problem: Health Behavior/Discharge Planning: Goal: Ability to identify and utilize available resources and services will improve Outcome: Progressing Goal: Ability to manage health-related needs will improve Outcome: Progressing   Problem: Metabolic: Goal: Ability to maintain appropriate glucose levels will improve Outcome: Progressing   Problem: Nutritional: Goal: Maintenance of adequate nutrition will improve Outcome: Progressing Goal: Progress toward achieving an optimal weight will improve Outcome: Progressing   Problem: Skin Integrity: Goal: Risk for impaired skin integrity will decrease Outcome: Progressing   Problem: Tissue Perfusion: Goal: Adequacy of tissue perfusion will improve Outcome: Progressing   Problem: Activity: Goal: Ability to tolerate increased activity will improve Outcome: Progressing   Problem: Respiratory: Goal: Ability to maintain a clear airway and adequate ventilation will improve Outcome: Progressing   Problem: Role Relationship: Goal: Method of communication will improve Outcome: Progressing   Problem: Education: Goal: Understanding of CV disease, CV risk reduction, and recovery process will improve Outcome: Progressing Goal: Individualized Educational Video(s) Outcome: Progressing   Problem: Activity: Goal: Ability to return to baseline activity level will improve Outcome: Progressing   Problem: Cardiovascular: Goal: Ability to achieve and maintain adequate cardiovascular perfusion will improve Outcome: Progressing Goal: Vascular access site(s) Level 0-1 will be maintained Outcome: Progressing   Problem: Health Behavior/Discharge Planning: Goal: Ability to safely manage health-related needs after discharge will improve Outcome: Progressing

## 2023-04-29 NOTE — Progress Notes (Signed)
 Per MD Dgayli okay for MAP to be <65 is systolic >90

## 2023-04-29 NOTE — Progress Notes (Signed)
 [facility] CARDIOLOGY PROGRESS NOTE       Patient ID: Beth [name] MRN: [id number] DOB/AGE: [birth year] 78 y.o.  Admit date: 04/25/2023 Referring Physician Dr. Renae Gloss Primary Physician Althea Charon, Netta Neat, DO  Primary Cardiologist Dr. Melton Alar Reason for Consultation tachycardia, elevated troponin  HPI: Beth [name] is a 78 y.o. female  with a past medical history of paroxysmal atrial fibrillation on eliquis, hypertension, hyperlipidemia, COPD on home 3L, depression who presented to the ED on 04/25/2023 for suicidal ideation. Has been treated for pneumonia and acute on chronic respiratory failure, decompensated earlier this AM requiring intubation. Was tachycardia and troponins found to be elevated this AM as well. Cardiology was consulted for further evaluation.   Interval history: -Patient seen and examined this morning, awake and alert but remains vented. -When asked if she is in any pain she shakes her head no. -No recurrence of arrhythmias on telemetry, remains in sinus rhythm with controlled heart rate. -BP stable, remains on low-dose Levophed.  Review of systems complete and found to be negative unless listed above    Past Medical History:  Diagnosis Date   Allergy    Anxiety    Asthma    Chronic pain syndrome    discharged from pain clinic, hx of narcotics seeking behavior   COPD (chronic obstructive pulmonary disease) (HCC)    CVA (cerebral infarction)    Depression    Fibromyalgia    Headache    Hyperlipidemia    Hypertension    IBS (irritable bowel syndrome)    Stroke (HCC)    Vitamin D deficiency     Past Surgical History:  Procedure Laterality Date   ABDOMINAL HYSTERECTOMY     APPENDECTOMY     BREAST EXCISIONAL BIOPSY  2011   Pt states lump removed, ? side, no scar seen   BREAST SURGERY  2011   biopsy   CERVICAL DISCECTOMY     CHOLECYSTECTOMY     SINUSOTOMY      Medications Prior to Admission  Medication Sig Dispense Refill Last  Dose/Taking   acetaminophen (TYLENOL) 500 MG tablet Take 500 mg by mouth 3 (three) times daily as needed for mild pain (pain score 1-3).   Taking As Needed   amiodarone (PACERONE) 200 MG tablet Take 1 tablet (200 mg total) by mouth 2 (two) times daily for 12 days, THEN 1 tablet (200 mg total) daily.   04/24/2023 at  8:00 AM   apixaban (ELIQUIS) 5 MG TABS tablet Take 1 tablet (5 mg total) by mouth 2 (two) times daily. 60 tablet 1 04/24/2023 at  5:00 PM   atorvastatin (LIPITOR) 40 MG tablet TAKE 1 TABLET BY MOUTH EVERY DAY (Patient taking differently: Take 40 mg by mouth at bedtime. TAKE 1 TABLET BY MOUTH EVERY DAY) 90 tablet 3 04/24/2023 at  6:00 PM   busPIRone (BUSPAR) 10 MG tablet Take 10 mg by mouth 2 (two) times daily.   04/24/2023 at  4:00 PM   camphor-menthol (SARNA) lotion Apply 1 Application topically 2 (two) times daily.   04/24/2023 at  3:00 PM   FLUoxetine (PROZAC) 10 MG tablet Take 15 mg by mouth daily.   04/24/2023 at  8:00 AM   furosemide (LASIX) 40 MG tablet Take 1 tablet (40 mg total) by mouth daily. If swelling is less, can take only 1 pill.   04/24/2023 at  8:00 AM   gabapentin (NEURONTIN) 100 MG capsule Take 1 capsule (100 mg total) by mouth 3 (three) times daily.  04/24/2023 at  8:00 PM   guaiFENesin (MUCINEX) 600 MG 12 hr tablet Take 600 mg by mouth every 12 (twelve) hours.   04/24/2023 at  8:00 PM   lamoTRIgine (LAMICTAL) 25 MG tablet Take 12.5 mg by mouth daily.   04/24/2023 at  8:00 AM   levothyroxine (SYNTHROID) 25 MCG tablet Take 1 tablet (25 mcg total) by mouth daily at 6 (six) AM. (Patient taking differently: Take 12.5 mcg by mouth daily at 6 (six) AM.)   04/24/2023 at  6:00 AM   LORazepam (ATIVAN) 0.5 MG tablet Take 0.5 mg by mouth every 8 (eight) hours as needed for anxiety, sleep, seizure or sedation.   Taking As Needed   melatonin 5 MG TABS Take 1 tablet (5 mg total) by mouth at bedtime.   04/24/2023 at  8:00 PM   Menthol-Zinc Oxide (CALMOSEPTINE) 0.44-20.6 % OINT Apply 1  application  topically as directed. Apply to area at every shift change   04/24/2023 Evening   metoprolol tartrate (LOPRESSOR) 25 MG tablet Take 0.5 tablets (12.5 mg total) by mouth 2 (two) times daily.   04/24/2023 at  8:00 PM   mirtazapine (REMERON) 15 MG tablet Take 15 mg by mouth at bedtime.   04/24/2023 at  8:00 PM   montelukast (SINGULAIR) 10 MG tablet TAKE 1 TABLET(10 MG) BY MOUTH AT BEDTIME 90 tablet 1 04/24/2023 at  8:00 PM   NYAMYC powder Apply 1 Application topically 2 (two) times daily.   04/24/2023 at  4:00 PM   polyethylene glycol (MIRALAX / GLYCOLAX) 17 g packet Take 17 g by mouth daily as needed for mild constipation.   Taking As Needed   traMADol (ULTRAM) 50 MG tablet Take 50 mg by mouth 3 (three) times daily as needed for moderate pain (pain score 4-6).   Taking As Needed   traZODone (DESYREL) 50 MG tablet Take 1 tablet (50 mg total) by mouth at bedtime.   04/24/2023 at  8:00 PM   TRELEGY ELLIPTA 200-62.5-25 MCG/ACT AEPB INHALE 1 PUFF INTO THE LUNGS DAILY 60 each 2 04/24/2023 at  8:00 AM   Social History   Socioeconomic History   Marital status: Legally Separated    Spouse name: Not on file   Number of children: 2   Years of education: Not on file   Highest education level: Not on file  Occupational History   Occupation: retired  Tobacco Use   Smoking status: Some Days    Current packs/day: 0.00    Average packs/day: 0.3 packs/day for 57.0 years (14.3 ttl pk-yrs)    Types: Cigarettes    Start date: 10/25/1963    Last attempt to quit: 10/24/2020    Years since quitting: 2.5   Smokeless tobacco: Never   Tobacco comments:    16 cigarettes a week. Khj 07/26/2022  Vaping Use   Vaping status: Never Used  Substance and Sexual Activity   Alcohol use: No    Alcohol/week: 0.0 standard drinks of alcohol   Drug use: No   Sexual activity: Not Currently  Other Topics Concern   Not on file  Social History Narrative   Not on file   Social Drivers of Health   Financial Resource  Strain: Low Risk  (02/01/2022)   Overall Financial Resource Strain (CARDIA)    Difficulty of Paying Living Expenses: Not hard at all  Food Insecurity: No Food Insecurity (04/25/2023)   Hunger Vital Sign    Worried About Running Out of Food in the Last Year:  Never true    Ran Out of Food in the Last Year: Never true  Transportation Needs: Unmet Transportation Needs (04/25/2023)   PRAPARE - Transportation    Lack of Transportation (Medical): Yes    Lack of Transportation (Non-Medical): Yes  Physical Activity: Insufficiently Active (02/01/2022)   Exercise Vital Sign    Days of Exercise per Week: 7 days    Minutes of Exercise per Session: 20 min  Stress: No Stress Concern Present (02/01/2022)   [employer] of Occupational Health - Occupational Stress Questionnaire    Feeling of Stress : Only a little  Social Connections: Socially Isolated (04/25/2023)   Social Connection and Isolation Panel [NHANES]    Frequency of Communication with Friends and Family: Twice a week    Frequency of Social Gatherings with Friends and Family: Once a week    Attends Religious Services: Never    Database administrator or Organizations: No    Attends Banker Meetings: Never    Marital Status: Separated  Intimate Partner Violence: Not At Risk (04/25/2023)   Humiliation, Afraid, Rape, and Kick questionnaire    Fear of Current or Ex-Partner: No    Emotionally Abused: No    Physically Abused: No    Sexually Abused: No    Family History  Problem Relation Age of Onset   Anxiety disorder Mother    Depression Mother    Breast cancer Mother 62   Cancer Father    Gallbladder disease Father    Alcohol abuse Father    Depression Father    Bipolar disorder Son      Vitals:   04/29/23 0903 04/29/23 0911 04/29/23 0915 04/29/23 0930  BP:  115/64 118/67 113/68  Pulse: (!) 141 (!) 128 (!) 123 (!) 125  Resp: 20 13 17 12   Temp:      TempSrc:      SpO2: 93% 94% 94% 92%  Weight:      Height:         PHYSICAL EXAM General: Ill appearing elderly female, intubated and sedated. HEENT: Normocephalic and atraumatic. Neck: No JVD.  Lungs: Intubated, mechanical breath sounds Heart: Irregularly irregular, fast rate. Normal S1 and S2 without gallops or murmurs.  Abdomen: Non-distended appearing.  Msk: Normal strength and tone for age. Extremities: Warm and well perfused. No clubbing, cyanosis. No edema.  Neuro: Alert and oriented X 3. Psych: Answers questions appropriately.   Labs: Basic Metabolic Panel: Recent Labs    04/28/23 2214 04/29/23 0426  NA 140 142  K 3.6 3.5  CL 107 108  CO2 24 26  GLUCOSE 155* 139*  BUN 16 16  CREATININE 0.90 0.94  CALCIUM 8.5* 8.4*  MG 2.6* 2.4  PHOS 3.0 3.1   Liver Function Tests: Recent Labs    04/26/23 2222 04/29/23 0426  AST 64*  --   ALT 31  --   ALKPHOS 95  --   BILITOT 0.4  --   PROT 6.4*  --   ALBUMIN 2.5* 2.4*   No results for input(s): "LIPASE", "AMYLASE" in the last 72 hours. CBC: Recent Labs    04/26/23 2222 04/28/23 0408 04/29/23 0426  WBC 15.9* 15.8* 20.1*  NEUTROABS 8.0*  --   --   HGB 13.2 9.6* 9.7*  HCT 42.1 29.5* 29.7*  MCV 95.9 92.8 91.4  PLT 325 176 279   Cardiac Enzymes: Recent Labs    04/27/23 2347 04/28/23 2214 04/29/23 0808  TROPONINIHS 449* 664* 340*   BNP: Recent  Labs    04/28/23 1004  BNP 1,612.0*   D-Dimer: No results for input(s): "DDIMER" in the last 72 hours. Hemoglobin A1C: Recent Labs    04/27/23 0150  HGBA1C 5.2   Fasting Lipid Panel: Recent Labs    04/29/23 0426  TRIG 105   Thyroid Function Tests: No results for input(s): "TSH", "T4TOTAL", "T3FREE", "THYROIDAB" in the last 72 hours.  Invalid input(s): "FREET3" Anemia Panel: No results for input(s): "VITAMINB12", "FOLATE", "FERRITIN", "TIBC", "IRON", "RETICCTPCT" in the last 72 hours.   Radiology: [facility] Chest Port 1 View Result Date: 04/29/2023 CLINICAL DATA:  78 year old female with respiratory failure. Intubated.  EXAM: PORTABLE CHEST 1 VIEW COMPARISON:  Portable chest yesterday and earlier. FINDINGS: Portable AP view at 0511 hours. Mildly more rotated to the right. Endotracheal tube tip in good position between the clavicles and carina. Visible enteric tube loops in the stomach. Mediastinal contours remain within normal limits. Lung volumes slightly larger. Coarse bilateral pulmonary interstitial opacity with vague, confluent lung base opacity continues. But mildly improved bilateral ventilation since yesterday. No pneumothorax. No areas of worsening ventilation. Stable visualized osseous structures. Paucity of bowel gas. IMPRESSION: 1. Satisfactory endotracheal and enteric tubes. 2. Mildly improved bilateral lung volumes and ventilation since yesterday. Ongoing widespread coarse interstitial and more confluent bilateral lung base opacity. Electronically Signed   By: Odessa Fleming M.D.   On: 04/29/2023 07:59   ECHOCARDIOGRAM COMPLETE Result Date: 04/28/2023    ECHOCARDIOGRAM REPORT   Patient Name:   Beth [name] Date of Exam: 04/28/2023 Medical Rec #:  [id number]        Height:       65.0 in Accession #:    [id number]       Weight:       120.0 lb Date of Birth:  02-17-1945        BSA:          1.592 m Patient Age:    77 years         BP:           132/64 mmHg Patient Gender: F                HR:           65 bpm. Exam Location:  [facility] Procedure: 2D Echo, Cardiac Doppler and Color Doppler (Both Spectral and Color            Flow Doppler were utilized during procedure). Indications:     Elevated Troponin  History:         Patient has prior history of Echocardiogram examinations, most                  recent 09/09/2022. COPD and Stroke, Arrythmias:Atrial                  Fibrillation, Signs/Symptoms:Dyspnea; Risk                  Factors:Hypertension, Dyslipidemia and Current Smoker. CKD.  Sonographer:     Mikki Harbor Referring Phys:  [id number] Andris Baumann Diagnosing Phys: Yvonne Kendall MD  Sonographer Comments: Echo  performed with patient supine and on artificial respirator. Image acquisition challenging due to respiratory motion. IMPRESSIONS  1. Left ventricular ejection fraction, by estimation, is 55 to 60%. The left ventricle has normal function. The left ventricle has no regional wall motion abnormalities. Left ventricular diastolic parameters were normal.  2. Right ventricular systolic function is mildly reduced. The right ventricular size is  normal. Mildly increased right ventricular wall thickness. There is severely elevated pulmonary artery systolic pressure.  3. Left atrial size was moderately dilated.  4. Moderate pleural effusion in the left lateral region.  5. The mitral valve is abnormal. Moderate to severe mitral valve regurgitation.  6. Tricuspid valve regurgitation is moderate to severe.  7. The aortic valve is tricuspid. There is mild calcification of the aortic valve. There is mild thickening of the aortic valve. Aortic valve regurgitation is mild. Aortic valve sclerosis/calcification is present, without any evidence of aortic stenosis.  8. The inferior vena cava is dilated in size with <50% respiratory variability, suggesting right atrial pressure of 15 mmHg. FINDINGS  Left Ventricle: Left ventricular ejection fraction, by estimation, is 55 to 60%. The left ventricle has normal function. The left ventricle has no regional wall motion abnormalities. The left ventricular internal cavity size was normal in size. There is  borderline left ventricular hypertrophy. Left ventricular diastolic parameters were normal. Right Ventricle: The right ventricular size is normal. Mildly increased right ventricular wall thickness. Right ventricular systolic function is mildly reduced. There is severely elevated pulmonary artery systolic pressure. The tricuspid regurgitant velocity is 3.64 m/s, and with an assumed right atrial pressure of 15 mmHg, the estimated right ventricular systolic pressure is 68.0 mmHg. Left Atrium: Left  atrial size was moderately dilated. Right Atrium: Right atrial size was normal in size. Pericardium: Trivial pericardial effusion is present. Mitral Valve: The mitral valve is abnormal. Mild to moderate mitral annular calcification. Moderate to severe mitral valve regurgitation. MV peak gradient, 7.1 mmHg. The mean mitral valve gradient is 2.0 mmHg. Tricuspid Valve: The tricuspid valve is normal in structure. Tricuspid valve regurgitation is moderate to severe. Aortic Valve: The aortic valve is tricuspid. There is mild calcification of the aortic valve. There is mild thickening of the aortic valve. Aortic valve regurgitation is mild. Aortic valve sclerosis/calcification is present, without any evidence of aortic stenosis. Aortic valve mean gradient measures 4.0 mmHg. Aortic valve peak gradient measures 8.0 mmHg. Aortic valve area, by VTI measures 2.04 cm. Pulmonic Valve: The pulmonic valve was not well visualized. Pulmonic valve regurgitation is not visualized. No evidence of pulmonic stenosis. Aorta: The aortic root and ascending aorta are structurally normal, with no evidence of dilitation. Venous: The inferior vena cava is dilated in size with less than 50% respiratory variability, suggesting right atrial pressure of 15 mmHg. IAS/Shunts: No atrial level shunt detected by color flow Doppler. Additional Comments: There is a moderate pleural effusion in the left lateral region.  LEFT VENTRICLE PLAX 2D LVIDd:         4.70 cm     Diastology LVIDs:         3.30 cm     LV e' medial:    8.59 cm/s LV PW:         1.13 cm     LV E/e' medial:  13.5 LV IVS:        0.80 cm     LV e' lateral:   13.30 cm/s LVOT diam:     1.90 cm     LV E/e' lateral: 8.7 LV SV:         67 LV SV Index:   42 LVOT Area:     2.84 cm  LV Volumes (MOD) LV vol d, MOD A2C: 53.5 ml LV vol d, MOD A4C: 57.5 ml LV vol s, MOD A2C: 20.6 ml LV vol s, MOD A4C: 25.1 ml LV SV MOD A2C:  32.9 ml LV SV MOD A4C:     57.5 ml LV SV MOD BP:      31.8 ml RIGHT  VENTRICLE RV Basal diam:  3.00 cm RV Mid diam:    2.30 cm LEFT ATRIUM             Index        RIGHT ATRIUM           Index LA diam:        4.20 cm 2.64 cm/m   RA Area:     14.70 cm LA Vol (A2C):   81.4 ml 51.13 ml/m  RA Volume:   35.50 ml  22.30 ml/m LA Vol (A4C):   76.5 ml 48.05 ml/m LA Biplane Vol: 79.6 ml 50.00 ml/m  AORTIC VALVE                    PULMONIC VALVE AV Area (Vmax):    2.21 cm     PV Vmax:       0.72 m/s AV Area (Vmean):   2.11 cm     PV Peak grad:  2.1 mmHg AV Area (VTI):     2.04 cm AV Vmax:           141.00 cm/s AV Vmean:          91.550 cm/s AV VTI:            0.328 m AV Peak Grad:      8.0 mmHg AV Mean Grad:      4.0 mmHg LVOT Vmax:         110.00 cm/s LVOT Vmean:        68.000 cm/s LVOT VTI:          0.236 m LVOT/AV VTI ratio: 0.72  AORTA Ao Root diam: 3.30 cm Ao Asc diam:  3.20 cm MITRAL VALVE                  TRICUSPID VALVE MV Area (PHT): 5.54 cm       TR Peak grad:   53.0 mmHg MV Area VTI:   1.91 cm       TR Vmax:        364.00 cm/s MV Peak grad:  7.1 mmHg MV Mean grad:  2.0 mmHg       SHUNTS MV Vmax:       1.33 m/s       Systemic VTI:  0.24 m MV Vmean:      56.9 cm/s      Systemic Diam: 1.90 cm MV Decel Time: 137 msec MR Peak grad:    145.9 mmHg MR Mean grad:    91.0 mmHg MR Vmax:         604.00 cm/s MR Vmean:        449.0 cm/s MR PISA:         2.26 cm MR PISA Eff ROA: 35 mm MR PISA Radius:  0.60 cm MV E velocity: 116.00 cm/s MV A velocity: 69.70 cm/s MV E/A ratio:  1.66 Cristal Deer End MD Electronically signed by Yvonne Kendall MD Signature Date/Time: 04/28/2023/7:14:48 PM    Final    DG Chest Port 1 View Result Date: 04/28/2023 CLINICAL DATA:  Intubation EXAM: PORTABLE CHEST 1 VIEW COMPARISON:  Two days ago FINDINGS: Diffuse interstitial opacity with patchy airspace density greatest at the right base, progressed at the right base. There is small volume pleural fluid likely. No pneumothorax. Endotracheal tube with tip between the clavicular  heads and carina. An enteric  tube reaches the stomach at least. Normal heart size IMPRESSION: Worsening infiltrate especially at the right lung base. New hardware in unremarkable position. Electronically Signed   By: Tiburcio Pea M.D.   On: 04/28/2023 10:26   DG Abd 1 View Result Date: 04/28/2023 CLINICAL DATA:  NG placement. EXAM: ABDOMEN - 1 VIEW COMPARISON:  None Available. FINDINGS: Enteric tube with tip in the region of the distal stomach. No bowel dilatation noted. Small bilateral pleural effusions and diffuse interstitial and bibasilar densities. IMPRESSION: Enteric tube with tip in the distal stomach. Electronically Signed   By: Elgie Collard M.D.   On: 04/28/2023 10:23   DG Chest Port 1 View Result Date: 04/26/2023 CLINICAL DATA:  Respiratory failure EXAM: PORTABLE CHEST 1 VIEW COMPARISON:  04/26/2023 at 9:13 a.m. FINDINGS: Increased predominantly interstitial opacities bilaterally greatest in the left mid/upper lung. No pleural effusion or pneumothorax. Stable cardiomediastinal silhouette. Aortic atherosclerotic calcification. IMPRESSION: Increased predominantly interstitial opacities bilaterally greatest in the left mid/upper lung. Findings favor pneumonia superimposed on a background emphysema. Electronically Signed   By: Minerva Fester M.D.   On: 04/26/2023 23:49   DG Chest Port 1 View Result Date: 04/26/2023 CLINICAL DATA:  Cough. Recent antibiotics for pneumonia with resulting diarrhea. History of CVA and atrial fibrillation. EXAM: PORTABLE CHEST 1 VIEW COMPARISON:  None recent. Chest CT 09/13/2022. Radiographs 09/13/2022 and 08/25/2022. FINDINGS: 0913 hours. The heart size and mediastinal contours are stable with mild aortic atherosclerosis. The aeration of the lungs has improved compared with the available prior studies. There are patchy pulmonary opacities bilaterally which could reflect postinflammatory scarring or recurrent infection. There is no consolidation, significant pleural effusion or pneumothorax.  The bones appear unchanged status post lower cervical fusion. IMPRESSION: Overall improvement in bilateral airspace opacities compared with prior studies from August. Patchy pulmonary opacities bilaterally which could reflect postinflammatory scarring or recurrent infection. No consolidation or pleural effusion. Electronically Signed   By: Carey Bullocks M.D.   On: 04/26/2023 13:05    ECHO as above  TELEMETRY reviewed by me 04/29/2023: VT rate 170-180s  EKG reviewed by me: VT 180 bpm  Data reviewed by me 04/29/2023: last 24h vitals tele labs imaging I/O hospitalist progress notes, PCCM notes  Principal Problem:   Acute on chronic respiratory failure with hypoxia and hypercapnia (HCC) Active Problems:   Pulmonary hypertension (HCC)   Chronic diastolic congestive heart failure (HCC)   Depression with suicidal ideation   Asthma with COPD (HCC)   Hypokalemia   Prolonged QT interval   UTI (urinary tract infection)   Atrial fibrillation with slow ventricular response (HCC)   Other specified hypothyroidism   Hypomagnesemia   Suicidal ideation   Diarrhea   Hypophosphatemia   Septic shock (HCC)   Metabolic acidosis   Multifocal pneumonia   Precordial chest pain   Respiratory distress   Protein-calorie malnutrition, severe   Decompensated heart failure (HCC)    ASSESSMENT AND PLAN:  Beth [name] is a 78 y.o. female  with a past medical history of paroxysmal atrial fibrillation on eliquis, hypertension, hyperlipidemia, COPD on home 3L, depression who presented to the ED on 04/25/2023 for suicidal ideation. Has been treated for pneumonia and acute on chronic respiratory failure, decompensated earlier this AM requiring intubation. Was tachycardia and troponins found to be elevated this AM as well. Cardiology was consulted for further evaluation.   # Acute on Chronic respiratory failure # Pneumonia # Ventricular Tachycardia # Demand Ischemia   #  Paroxysmal Atrial fibrillation  Patient's  respiratory status decompensated this morning that required intubation and had VT in 170-180s. Patient received 2x bolus of 150 mg Amio and IV 2g Mg, converted to NSR rate in 70s. Patient is hemodynamically stable. Trops elevated and flat 481 > 449 likely due to demand ischemia. Today labs showed elevated BNP 1612.  Echo with preserved EF, no wall motion abnormalities, mildly reduced RV function, severely elevated PASP, moderate to severe MR and TR, moderate pleural effusion. -IV Amio discontinued yesterday due to bradycardia.  Will defer resuming at this time. -Agree with continued IV diuresis, appears to be tolerating this well. -Continue Eliquis for stroke risk reduction  -Continue atorvastatin 40 mg per tube -Continue metoprolol tartrate 12.5 mg bid per tube.  -Further management of respiratory failure per PCCM, possible extubation today. -Will plan for possible RHC to further evaluate pulmonary pressures on Wednesday versus Thursday pending how patient does.   This patient's plan of care was discussed and created with Dr. Darrold Junker and he is in agreement.  Signed: Gale Journey, PA-C  04/29/2023, 9:53 AM [facility]

## 2023-04-29 NOTE — Plan of Care (Signed)
  Problem: Clinical Measurements: Goal: Respiratory complications will improve Outcome: Progressing   Problem: Activity: Goal: Risk for activity intolerance will decrease Outcome: Progressing   Problem: Coping: Goal: Level of anxiety will decrease Outcome: Progressing   Problem: Pain Managment: Goal: General experience of comfort will improve and/or be controlled Outcome: Progressing   Problem: Fluid Volume: Goal: Ability to maintain a balanced intake and output will improve Outcome: Progressing   Problem: Metabolic: Goal: Ability to maintain appropriate glucose levels will improve Outcome: Progressing

## 2023-04-30 ENCOUNTER — Encounter
Admission: EM | Disposition: A | Discharge status: Hospice | Payer: Self-pay | Source: Skilled Nursing Facility | Attending: Internal Medicine

## 2023-04-30 DIAGNOSIS — J9622 Acute and chronic respiratory failure with hypercapnia: Secondary | ICD-10-CM | POA: Diagnosis not present

## 2023-04-30 DIAGNOSIS — J9621 Acute and chronic respiratory failure with hypoxia: Secondary | ICD-10-CM | POA: Diagnosis not present

## 2023-04-30 LAB — RENAL FUNCTION PANEL
Albumin: 2.1 g/dL — ABNORMAL LOW (ref 3.5–5.0)
Anion gap: 8 (ref 5–15)
BUN: 18 mg/dL (ref 8–23)
CO2: 28 mmol/L (ref 22–32)
Calcium: 8.7 mg/dL — ABNORMAL LOW (ref 8.9–10.3)
Chloride: 104 mmol/L (ref 98–111)
Creatinine, Ser: 0.88 mg/dL (ref 0.44–1.00)
GFR, Estimated: >60 mL/min (ref >60)
Glucose, Bld: 134 mg/dL — ABNORMAL HIGH (ref 70–99)
Phosphorus: 3.9 mg/dL (ref 2.5–4.6)
Potassium: 4 mmol/L (ref 3.5–5.1)
Sodium: 140 mmol/L (ref 135–145)

## 2023-04-30 LAB — CBC
HCT: 26.8 % — ABNORMAL LOW (ref 36.0–46.0)
Hemoglobin: 8.6 g/dL — ABNORMAL LOW (ref 12.0–15.0)
MCH: 29.7 pg (ref 26.0–34.0)
MCHC: 32.1 g/dL (ref 30.0–36.0)
MCV: 92.4 fL (ref 80.0–100.0)
Platelets: 165 10*3/uL (ref 150–400)
RBC: 2.9 MIL/uL — ABNORMAL LOW (ref 3.87–5.11)
RDW: 16.6 % — ABNORMAL HIGH (ref 11.5–15.5)
WBC: 10.8 10*3/uL — ABNORMAL HIGH (ref 4.0–10.5)
nRBC: 0 % (ref 0.0–0.2)

## 2023-04-30 LAB — GLUCOSE, CAPILLARY
Glucose-Capillary: 105 mg/dL — ABNORMAL HIGH (ref 70–99)
Glucose-Capillary: 117 mg/dL — ABNORMAL HIGH (ref 70–99)
Glucose-Capillary: 143 mg/dL — ABNORMAL HIGH (ref 70–99)
Glucose-Capillary: 151 mg/dL — ABNORMAL HIGH (ref 70–99)
Glucose-Capillary: 183 mg/dL — ABNORMAL HIGH (ref 70–99)
Glucose-Capillary: 84 mg/dL (ref 70–99)

## 2023-04-30 LAB — POCT I-STAT EG7
Acid-Base Excess: 6 mmol/L — ABNORMAL HIGH (ref 0.0–2.0)
Bicarbonate: 31.9 mmol/L — ABNORMAL HIGH (ref 20.0–28.0)
Calcium, Ion: 1.27 mmol/L (ref 1.15–1.40)
HCT: 27 % — ABNORMAL LOW (ref 36.0–46.0)
Hemoglobin: 9.2 g/dL — ABNORMAL LOW (ref 12.0–15.0)
O2 Saturation: 58 %
Potassium: 3.7 mmol/L (ref 3.5–5.1)
Sodium: 142 mmol/L (ref 135–145)
TCO2: 33 mmol/L — ABNORMAL HIGH (ref 22–32)
pCO2, Ven: 53.3 mmHg (ref 44–60)
pH, Ven: 7.385 (ref 7.25–7.43)
pO2, Ven: 31 mmHg — CL (ref 32–45)

## 2023-04-30 LAB — LEGIONELLA PNEUMOPHILA SEROGP 1 UR AG: L. pneumophila Serogp 1 Ur Ag: NEGATIVE

## 2023-04-30 LAB — MAGNESIUM: Magnesium: 2.2 mg/dL (ref 1.7–2.4)

## 2023-04-30 SURGERY — RIGHT HEART CATH
Anesthesia: Moderate Sedation

## 2023-04-30 MED ORDER — MELATONIN 5 MG PO TABS
5.0000 mg | ORAL_TABLET | Freq: Every day | ORAL | Status: DC
  Administered 2023-04-30 – 2023-05-04 (×5): 5 mg via ORAL
  Filled 2023-04-30 (×5): qty 1

## 2023-04-30 MED ORDER — MIDAZOLAM HCL 2 MG/2ML IJ SOLN
INTRAMUSCULAR | Status: AC
  Filled 2023-04-30: qty 2

## 2023-04-30 MED ORDER — APIXABAN 5 MG PO TABS
5.0000 mg | ORAL_TABLET | Freq: Two times a day (BID) | ORAL | Status: DC
  Administered 2023-04-30 – 2023-05-05 (×10): 5 mg via ORAL
  Filled 2023-04-30 (×10): qty 1

## 2023-04-30 MED ORDER — FLUOXETINE HCL 20 MG/5ML PO SOLN
15.0000 mg | Freq: Every day | ORAL | Status: DC
  Administered 2023-05-01: 15 mg via ORAL
  Filled 2023-04-30: qty 3.75

## 2023-04-30 MED ORDER — GABAPENTIN 100 MG PO CAPS
100.0000 mg | ORAL_CAPSULE | Freq: Three times a day (TID) | ORAL | Status: DC
  Administered 2023-04-30 – 2023-05-05 (×15): 100 mg via ORAL
  Filled 2023-04-30 (×15): qty 1

## 2023-04-30 MED ORDER — AMIODARONE IV BOLUS ONLY 150 MG/100ML
150.0000 mg | Freq: Once | INTRAVENOUS | Status: AC
  Administered 2023-04-30: 150 mg via INTRAVENOUS
  Filled 2023-04-30: qty 100

## 2023-04-30 MED ORDER — METOPROLOL TARTRATE 25 MG PO TABS: 12.5000 mg | ORAL_TABLET | Freq: Two times a day (BID) | ORAL | Status: DC

## 2023-04-30 MED ORDER — MIDAZOLAM HCL 2 MG/2ML IJ SOLN
INTRAMUSCULAR | Status: DC | PRN
  Administered 2023-04-30: .5 mg via INTRAVENOUS

## 2023-04-30 MED ORDER — GUAIFENESIN ER 600 MG PO TB12
600.0000 mg | ORAL_TABLET | Freq: Two times a day (BID) | ORAL | Status: DC
  Administered 2023-04-30 – 2023-05-05 (×10): 600 mg via ORAL
  Filled 2023-04-30 (×10): qty 1

## 2023-04-30 MED ORDER — LABETALOL HCL 5 MG/ML IV SOLN: 10.0000 mg | INTRAVENOUS | Status: DC | PRN

## 2023-04-30 MED ORDER — SODIUM CHLORIDE 0.9% FLUSH
3.0000 mL | Freq: Two times a day (BID) | INTRAVENOUS | Status: DC
  Administered 2023-04-30 – 2023-05-05 (×10): 3 mL via INTRAVENOUS

## 2023-04-30 MED ORDER — FENTANYL CITRATE (PF) 100 MCG/2ML IJ SOLN
INTRAMUSCULAR | Status: AC
  Filled 2023-04-30: qty 2

## 2023-04-30 MED ORDER — TRAZODONE HCL 50 MG PO TABS
50.0000 mg | ORAL_TABLET | Freq: Every day | ORAL | Status: DC
  Administered 2023-04-30 – 2023-05-02 (×3): 50 mg via ORAL
  Filled 2023-04-30 (×3): qty 1

## 2023-04-30 MED ORDER — MIRTAZAPINE 15 MG PO TABS
7.5000 mg | ORAL_TABLET | Freq: Every day | ORAL | Status: DC
  Administered 2023-04-30 – 2023-05-04 (×5): 7.5 mg via ORAL
  Filled 2023-04-30 (×5): qty 1

## 2023-04-30 MED ORDER — FENTANYL CITRATE (PF) 100 MCG/2ML IJ SOLN
INTRAMUSCULAR | Status: DC | PRN
  Administered 2023-04-30: 12.5 ug via INTRAVENOUS

## 2023-04-30 MED ORDER — SODIUM CHLORIDE 0.9 % IV SOLN: 250.0000 mL | INTRAVENOUS | Status: AC | PRN

## 2023-04-30 MED ORDER — METOPROLOL TARTRATE 25 MG PO TABS
25.0000 mg | ORAL_TABLET | Freq: Two times a day (BID) | ORAL | Status: DC
  Administered 2023-04-30: 25 mg via ORAL
  Filled 2023-04-30: qty 1

## 2023-04-30 MED ORDER — ENSURE ENLIVE PO LIQD
237.0000 mL | Freq: Three times a day (TID) | ORAL | Status: DC
  Administered 2023-04-30 – 2023-05-05 (×14): 237 mL via ORAL

## 2023-04-30 MED ORDER — LIDOCAINE HCL (PF) 1 % IJ SOLN
INTRAMUSCULAR | Status: DC | PRN
  Administered 2023-04-30 (×2): 5 mL

## 2023-04-30 MED ORDER — HEPARIN (PORCINE) IN NACL 2000-0.9 UNIT/L-% IV SOLN
INTRAVENOUS | Status: DC | PRN
  Administered 2023-04-30: 1000 mL

## 2023-04-30 MED ORDER — BUSPIRONE HCL 10 MG PO TABS
10.0000 mg | ORAL_TABLET | Freq: Two times a day (BID) | ORAL | Status: DC
Start: 2023-04-30 — End: 2023-05-03
  Administered 2023-04-30 – 2023-05-03 (×7): 10 mg via ORAL
  Filled 2023-04-30 (×7): qty 1

## 2023-04-30 MED ORDER — ACETAMINOPHEN 325 MG PO TABS
650.0000 mg | ORAL_TABLET | ORAL | Status: DC | PRN
  Administered 2023-05-01: 650 mg via ORAL

## 2023-04-30 MED ORDER — MAGNESIUM SULFATE 2 GM/50ML IV SOLN
2.0000 g | Freq: Once | INTRAVENOUS | Status: AC
  Administered 2023-04-30: 2 g via INTRAVENOUS
  Filled 2023-04-30: qty 50

## 2023-04-30 MED ORDER — DOCUSATE SODIUM 100 MG PO CAPS
100.0000 mg | ORAL_CAPSULE | Freq: Two times a day (BID) | ORAL | Status: DC
  Administered 2023-05-02 – 2023-05-05 (×4): 100 mg via ORAL
  Filled 2023-04-30 (×8): qty 1

## 2023-04-30 MED ORDER — METOPROLOL TARTRATE 5 MG/5ML IV SOLN: 5.0000 mg | INTRAVENOUS | Status: DC | PRN

## 2023-04-30 MED ORDER — LEVOTHYROXINE SODIUM 25 MCG PO TABS
12.5000 ug | ORAL_TABLET | Freq: Every day | ORAL | Status: DC
  Administered 2023-05-01 – 2023-05-05 (×5): 12.5 ug via ORAL
  Filled 2023-04-30: qty 1
  Filled 2023-04-30: qty 0.5
  Filled 2023-04-30 (×2): qty 1
  Filled 2023-04-30: qty 0.5

## 2023-04-30 MED ORDER — ATORVASTATIN CALCIUM 20 MG PO TABS
40.0000 mg | ORAL_TABLET | Freq: Every day | ORAL | Status: DC
  Administered 2023-04-30 – 2023-05-04 (×5): 40 mg via ORAL
  Filled 2023-04-30 (×5): qty 2

## 2023-04-30 MED ORDER — SODIUM CHLORIDE 0.9% FLUSH: 3.0000 mL | INTRAVENOUS | Status: DC | PRN

## 2023-04-30 MED ORDER — METOPROLOL TARTRATE 25 MG PO TABS
25.0000 mg | ORAL_TABLET | ORAL | Status: AC
  Administered 2023-04-30: 25 mg via ORAL
  Filled 2023-04-30: qty 1

## 2023-04-30 MED ORDER — MONTELUKAST SODIUM 10 MG PO TABS
10.0000 mg | ORAL_TABLET | Freq: Every day | ORAL | Status: DC
Start: 2023-04-30 — End: 2023-05-05
  Administered 2023-04-30 – 2023-05-04 (×5): 10 mg via ORAL
  Filled 2023-04-30 (×5): qty 1

## 2023-04-30 MED ORDER — HYDROCOD POLI-CHLORPHE POLI ER 10-8 MG/5ML PO SUER
5.0000 mL | Freq: Two times a day (BID) | ORAL | Status: DC | PRN
  Administered 2023-04-30 – 2023-05-01 (×2): 5 mL via ORAL
  Filled 2023-04-30 (×2): qty 5

## 2023-04-30 MED ORDER — LORAZEPAM 0.5 MG PO TABS
0.5000 mg | ORAL_TABLET | Freq: Three times a day (TID) | ORAL | Status: DC | PRN
  Administered 2023-05-02 – 2023-05-04 (×3): 0.5 mg via ORAL
  Filled 2023-04-30 (×3): qty 1

## 2023-04-30 MED ORDER — SODIUM CHLORIDE 0.9 % WEIGHT BASED INFUSION: 1.0000 mL/kg/h | INTRAVENOUS | Status: AC

## 2023-04-30 MED ORDER — LAMOTRIGINE 25 MG PO TABS
12.5000 mg | ORAL_TABLET | Freq: Every day | ORAL | Status: DC
  Administered 2023-04-30 – 2023-05-01 (×2): 12.5 mg via ORAL
  Filled 2023-04-30 (×2): qty 1

## 2023-04-30 MED ORDER — ADULT MULTIVITAMIN W/MINERALS CH
1.0000 | ORAL_TABLET | Freq: Every day | ORAL | Status: DC
  Administered 2023-05-01 – 2023-05-05 (×5): 1 via ORAL
  Filled 2023-04-30 (×5): qty 1

## 2023-04-30 MED ORDER — GUAIFENESIN ER 600 MG PO TB12: 600.0000 mg | ORAL_TABLET | Freq: Two times a day (BID) | ORAL | Status: DC

## 2023-04-30 MED ORDER — ENSURE ENLIVE PO LIQD
237.0000 mL | Freq: Two times a day (BID) | ORAL | Status: DC
  Administered 2023-04-30: 237 mL via ORAL

## 2023-04-30 MED ORDER — HYDRALAZINE HCL 20 MG/ML IJ SOLN: 10.0000 mg | INTRAMUSCULAR | Status: DC | PRN

## 2023-04-30 SURGICAL SUPPLY — 8 items
CATH SWAN GANZ 7F STRAIGHT (CATHETERS) IMPLANT
DRAPE BRACHIAL (DRAPES) IMPLANT
NDL PERC 18GX7CM (NEEDLE) IMPLANT
NEEDLE PERC 18GX7CM (NEEDLE) ×1 IMPLANT
PACK CARDIAC CATH (CUSTOM PROCEDURE TRAY) ×1 IMPLANT
SET ATX-X65L (MISCELLANEOUS) IMPLANT
SHEATH AVANTI 7FRX11 (SHEATH) IMPLANT
SHEATH GLIDE SLENDER 4/5FR (SHEATH) IMPLANT

## 2023-04-30 NOTE — Progress Notes (Signed)
 Telephone report called to Frederich Chick prior to transport to specials recovery for right heart cath.

## 2023-04-30 NOTE — Progress Notes (Signed)
 Patients O2 sats were 89-91% she was SOB,  tuned her O2 up to 3 LPM. Patient recovered well, now at 97%. MD aware.

## 2023-04-30 NOTE — Progress Notes (Signed)
 Progress Note   Patient: Beth [name] ZOX:[id number] DOB: 05-Feb-1945 DOA: 04/25/2023     5 DOS: the patient was seen and examined on 04/30/2023   Brief hospital course: 78 y.o. female with medical history significant for CVA with left-sided residual weakness, depression, anxiety, neuropathy, insomnia, atrial fibrillation on Eliquis, COPD on home O2 at 3 L who was brought to the ED after she tried to stab herself with scissors.  She endorses feeling suicidal.  Patient recently recovered from a bout of pneumonia, completing antibiotic treatment.  She did endorse diarrhea in the few days prior.  She has had very poor oral intake. ED course and data review: BP slightly elevated on arrival at 170/72, pulse in the 50s otherwise normal vitals Labs notable for potassium of 2.2 and magnesium 1.6.  TSH slightly elevated at 5.528.  Free T4 also elevated at 1.66 Urinalysis with moderate leukocytes CBC WNL, BMP except for potassium WNL. UDS, salicylate, EtOH and acetaminophen levels within normal parameters EKG, personally reviewed and interpreted showing sinus bradycardia at 51 and prolonged QT to 648   Patient was started on ceftriaxone for possibility of UTI.  Also given oral and IV potassium repletion.   Seen by behavioral health,    Hospitalist consulted for admission.   3/28.  Potassium still low this morning IV and oral potassium ordered.  3/29.  Psychiatry changed her medications over to Remeron at night secondary to prolonged QT.  Electrolytes much improved.  Patient felt short of breath this morning.  Chest x-ray showing overall improvement in bilateral airspace opacities from prior studies.  Patient had worsening shortness of breath and was acidotic and sent to the ICU for BiPAP and started on aggressive antibiotics and steroids.  3/30.  Patient did not like the BiPAP mask.  Placed back on nasal cannula.  On IV fluids.  Continue Zosyn.  Discontinue vancomycin with MRSA PCR being negative.   Continue Solu-Medrol.  3/31 -- pt acutely decompensated with flash pulmonary edema, in addition to SVT/VT followed by A-fib with RVR.  Pt had to emergently intubated to transferred to ICU.  In ICU, Cardiology was consulted.  Patient was aggressively diuresed and placed on IV amiodarone. Tested + for non-Covid coronoavirus OC43. Extubated 4/1.  4/2 -- TRH resumed care, pt extubated yesterday.   RHC cath today, see report and Cardiology recommendations.   Assessment and Plan:  Acute on chronic respiratory failure with hypoxia and hypercapnia  Required BiPAP >> emergent intubation on 3/31, extubated 4/1 after aggressive diuresis for flash pulmonary edema --Mgmt of underlying conditions as below --O2 per protocol, wean as tolerated --BiPAP PRN --Pulmonary hygiene   Septic shock - resolved off pressors Multifocal pneumonia, tachypnea, leukocytosis and elevated lactic acid.  Given IV fluids per sepsis on admission. COPD with acute exacerbation Hx of Asthma/COPD --Continue IV antibiotics --IV steroids --Bronchodilators --Mucolytics and other supportive care per orders   Flash Pulmonary Edema - required intubation on 3/31, aggressively diuresed, extubated 4/1. Acute on Chronic diastolic congestive heart failure  SVT  / V Tach A-Fib with RVR Demand Ischemia Hx of Paroxysmal A-fib --Cardiology following - see ercs --On IV amiodarone, likely to PO tomorrow --Eliquis for stroke risk reduction --Lipitor --On metoprolol 25 mg BID --Daily weights & strict Io's --IV diuresis has been stopped, per cardiology --Monitor renal function, electrolytes --RHC cath today -   Metabolic acidosis - resolved --Monitor BMP   Hypophosphatemia Replaced   Hypomagnesemia Replaced   Hypokalemia Replaced   UTI (urinary tract infection)  Urine culture only 20k colonies E. Coli   Prolonged QT interval Avoid QT prolonging drugs Serial ekg's Replace K, Mg Telemetry   Depression with  suicidal ideation Involuntary commitment Patient still on one-to-one observation.  Psychiatry changing medications over to Remeron with prolonged QT.   Atrial fibrillation with slow ventricular response (HCC) Continue Eliquis for anticoagulation.  Holding amiodarone with prolonged QT.  low-dose Toprol-XL at night.   Diarrhea Stool studies negative   Other specified hypothyroidism Both TSH and T4 elevated --Continue Synthroid 25 mcg    Pulmonary hypertension  Control blood pressure. O2 per protocol. Monitor hemodynamics closely.      Subjective: Pt seen in stepdown this AM with family at bedside. Pt reports feeling better, only feels little short of breath at times. No other complaints but feels anxious.  Poor PO intake reported.  Lower urine output this shift also.    Physical Exam: Vitals:   04/30/23 1300 04/30/23 1400 04/30/23 1500 04/30/23 1530  BP: (!) 147/66 (!) 144/71 132/62   Pulse: 74 80 76   Resp: 20 (!) 22 13   Temp:    99.9 F (37.7 C)  TempSrc:    Oral  SpO2: 93% 97% 97%   Weight:      Height:       General exam: awake, alert, no acute distress, chronically ill appearing HEENT: atraumatic, clear conjunctiva, anicteric sclera, moist mucus membranes, hearing grossly normal  Respiratory system: CTAB, no wheezes, rales or rhonchi, normal respiratory effort. Cardiovascular system: normal S1/S2, irregularly irregular, no pedal edema.   Gastrointestinal system: soft, NT, ND, no HSM felt, +bowel sounds. Central nervous system: A&O x 3. no gross focal neurologic deficits, normal speech Extremities: moves all, no edema, normal tone Skin: dry, intact, normal temperature Psychiatry: normal mood, congruent affect  Data Reviewed:  Notable labs --  Hbg improved 8.6 >> 9.2 BMP normal except glucose 134, Ca 8.7 Albumin 2.1  Family Communication: at bedside on rounds  Disposition: Status is: Inpatient Remains inpatient appropriate because: severity of illness as  above, on IV therapies, HR's uncontrolled, ongoing evaluation with cardiology   Planned Discharge Destination: Skilled nursing facility    Time spent: 52 minutes including time at bedside and in coordination of care  Author: Pennie Banter, DO 04/30/2023 5:51 PM  For on call review [email or web address].

## 2023-04-30 NOTE — Progress Notes (Signed)
 PHARMACY CONSULT NOTE - FOLLOW UP  Pharmacy Consult for Electrolyte Monitoring and Replacement   Recent Labs: Potassium (mmol/L)  Date Value  04/30/2023 4.0  12/26/2012 3.3 (L)   Magnesium (mg/dL)  Date Value  86/57/8469 2.2  12/25/2012 1.8   Calcium (mg/dL)  Date Value  62/95/2841 8.7 (L)   Calcium, Total (mg/dL)  Date Value  32/44/0102 9.1   Albumin (g/dL)  Date Value  72/53/6644 2.1 (L)  03/23/2015 3.6  12/20/2012 3.5   Phosphorus (mg/dL)  Date Value  03/47/4259 3.9   Sodium (mmol/L)  Date Value  04/30/2023 140  01/01/2021 143  12/26/2012 144     Assessment: 78 yo F with PMH including CVA, Afib (on Eliquis) presenting with hypokalemia. Per patient, recently experienced diarrhea for a few days due to taking antibiotic for pneumonia. QTc on ECG this morning was 648. Pharmacy has been consulted to manage electrolytes.  Goal of Therapy:  Potassium 4.0 - 5.1 mmol/L Magnesium 2.0 - 2.4 mg/dL All Other Electrolytes WNL  Plan: ---no electrolyte replacement warranted for today ---recheck electrolytes in am  Lowella Bandy, PharmD Clinical Pharmacist 04/30/2023 7:10 AM

## 2023-04-30 NOTE — Progress Notes (Signed)
 [facility] CLINIC CARDIOLOGY PROGRESS NOTE       Patient ID: ANATASIA TINO MRN: [id number] DOB/AGE: Oct 13, 1945 78 y.o.  Admit date: 04/25/2023 Referring Physician Dr. Renae Gloss Primary Physician Althea Charon, Netta Neat, DO  Primary Cardiologist Dr. Melton Alar Reason for Consultation tachycardia, elevated troponin  HPI: SHADIYAH WERNLI is a 78 y.o. female  with a past medical history of paroxysmal atrial fibrillation on eliquis, hypertension, hyperlipidemia, COPD on home 3L, depression who presented to the ED on 04/25/2023 for suicidal ideation. Has been treated for pneumonia and acute on chronic respiratory failure, decompensated earlier this AM requiring intubation. Was tachycardia and troponins found to be elevated this AM as well. Cardiology was consulted for further evaluation.   Interval history: -Patient seen and examined this morning, resting comfortably. -Extubated yesterday afternoon, respiratory status stable. -Converting in and out of atrial fibrillation/sinus rhythm.  -BP stable off of pressors.  Review of systems complete and found to be negative unless listed above    Past Medical History:  Diagnosis Date   Allergy    Anxiety    Asthma    Chronic pain syndrome    discharged from pain clinic, hx of narcotics seeking behavior   COPD (chronic obstructive pulmonary disease) (HCC)    CVA (cerebral infarction)    Depression    Fibromyalgia    Headache    Hyperlipidemia    Hypertension    IBS (irritable bowel syndrome)    Stroke (HCC)    Vitamin D deficiency     Past Surgical History:  Procedure Laterality Date   ABDOMINAL HYSTERECTOMY     APPENDECTOMY     BREAST EXCISIONAL BIOPSY  2011   Pt states lump removed, ? side, no scar seen   BREAST SURGERY  2011   biopsy   CERVICAL DISCECTOMY     CHOLECYSTECTOMY     SINUSOTOMY      Medications Prior to Admission  Medication Sig Dispense Refill Last Dose/Taking   acetaminophen (TYLENOL) 500 MG tablet Take 500 mg  by mouth 3 (three) times daily as needed for mild pain (pain score 1-3).   Taking As Needed   amiodarone (PACERONE) 200 MG tablet Take 1 tablet (200 mg total) by mouth 2 (two) times daily for 12 days, THEN 1 tablet (200 mg total) daily.   04/24/2023 at  8:00 AM   apixaban (ELIQUIS) 5 MG TABS tablet Take 1 tablet (5 mg total) by mouth 2 (two) times daily. 60 tablet 1 04/24/2023 at  5:00 PM   atorvastatin (LIPITOR) 40 MG tablet TAKE 1 TABLET BY MOUTH EVERY DAY (Patient taking differently: Take 40 mg by mouth at bedtime. TAKE 1 TABLET BY MOUTH EVERY DAY) 90 tablet 3 04/24/2023 at  6:00 PM   busPIRone (BUSPAR) 10 MG tablet Take 10 mg by mouth 2 (two) times daily.   04/24/2023 at  4:00 PM   camphor-menthol (SARNA) lotion Apply 1 Application topically 2 (two) times daily.   04/24/2023 at  3:00 PM   FLUoxetine (PROZAC) 10 MG tablet Take 15 mg by mouth daily.   04/24/2023 at  8:00 AM   furosemide (LASIX) 40 MG tablet Take 1 tablet (40 mg total) by mouth daily. If swelling is less, can take only 1 pill.   04/24/2023 at  8:00 AM   gabapentin (NEURONTIN) 100 MG capsule Take 1 capsule (100 mg total) by mouth 3 (three) times daily.   04/24/2023 at  8:00 PM   guaiFENesin (MUCINEX) 600 MG 12 hr tablet Take 600  mg by mouth every 12 (twelve) hours.   04/24/2023 at  8:00 PM   lamoTRIgine (LAMICTAL) 25 MG tablet Take 12.5 mg by mouth daily.   04/24/2023 at  8:00 AM   levothyroxine (SYNTHROID) 25 MCG tablet Take 1 tablet (25 mcg total) by mouth daily at 6 (six) AM. (Patient taking differently: Take 12.5 mcg by mouth daily at 6 (six) AM.)   04/24/2023 at  6:00 AM   LORazepam (ATIVAN) 0.5 MG tablet Take 0.5 mg by mouth every 8 (eight) hours as needed for anxiety, sleep, seizure or sedation.   Taking As Needed   melatonin 5 MG TABS Take 1 tablet (5 mg total) by mouth at bedtime.   04/24/2023 at  8:00 PM   Menthol-Zinc Oxide (CALMOSEPTINE) 0.44-20.6 % OINT Apply 1 application  topically as directed. Apply to area at every shift change    04/24/2023 Evening   metoprolol tartrate (LOPRESSOR) 25 MG tablet Take 0.5 tablets (12.5 mg total) by mouth 2 (two) times daily.   04/24/2023 at  8:00 PM   mirtazapine (REMERON) 15 MG tablet Take 15 mg by mouth at bedtime.   04/24/2023 at  8:00 PM   montelukast (SINGULAIR) 10 MG tablet TAKE 1 TABLET(10 MG) BY MOUTH AT BEDTIME 90 tablet 1 04/24/2023 at  8:00 PM   NYAMYC powder Apply 1 Application topically 2 (two) times daily.   04/24/2023 at  4:00 PM   polyethylene glycol (MIRALAX / GLYCOLAX) 17 g packet Take 17 g by mouth daily as needed for mild constipation.   Taking As Needed   traMADol (ULTRAM) 50 MG tablet Take 50 mg by mouth 3 (three) times daily as needed for moderate pain (pain score 4-6).   Taking As Needed   traZODone (DESYREL) 50 MG tablet Take 1 tablet (50 mg total) by mouth at bedtime.   04/24/2023 at  8:00 PM   TRELEGY ELLIPTA 200-62.5-25 MCG/ACT AEPB INHALE 1 PUFF INTO THE LUNGS DAILY 60 each 2 04/24/2023 at  8:00 AM   Social History   Socioeconomic History   Marital status: Legally Separated    Spouse name: Not on file   Number of children: 2   Years of education: Not on file   Highest education level: Not on file  Occupational History   Occupation: retired  Tobacco Use   Smoking status: Some Days    Current packs/day: 0.00    Average packs/day: 0.3 packs/day for 57.0 years (14.3 ttl pk-yrs)    Types: Cigarettes    Start date: 10/25/1963    Last attempt to quit: 10/24/2020    Years since quitting: 2.5   Smokeless tobacco: Never   Tobacco comments:    16 cigarettes a week. Khj 07/26/2022  Vaping Use   Vaping status: Never Used  Substance and Sexual Activity   Alcohol use: No    Alcohol/week: 0.0 standard drinks of alcohol   Drug use: No   Sexual activity: Not Currently  Other Topics Concern   Not on file  Social History Narrative   Not on file   Social Drivers of Health   Financial Resource Strain: Low Risk  (02/01/2022)   Overall Financial Resource Strain (CARDIA)     Difficulty of Paying Living Expenses: Not hard at all  Food Insecurity: No Food Insecurity (04/25/2023)   Hunger Vital Sign    Worried About Running Out of Food in the Last Year: Never true    Ran Out of Food in the Last Year: Never true  Transportation Needs: Unmet Transportation Needs (04/25/2023)   PRAPARE - Transportation    Lack of Transportation (Medical): Yes    Lack of Transportation (Non-Medical): Yes  Physical Activity: Insufficiently Active (02/01/2022)   Exercise Vital Sign    Days of Exercise per Week: 7 days    Minutes of Exercise per Session: 20 min  Stress: No Stress Concern Present (02/01/2022)   [employer] of Occupational Health - Occupational Stress Questionnaire    Feeling of Stress : Only a little  Social Connections: Socially Isolated (04/25/2023)   Social Connection and Isolation Panel [NHANES]    Frequency of Communication with Friends and Family: Twice a week    Frequency of Social Gatherings with Friends and Family: Once a week    Attends Religious Services: Never    Database administrator or Organizations: No    Attends Banker Meetings: Never    Marital Status: Separated  Intimate Partner Violence: Not At Risk (04/25/2023)   Humiliation, Afraid, Rape, and Kick questionnaire    Fear of Current or Ex-Partner: No    Emotionally Abused: No    Physically Abused: No    Sexually Abused: No    Family History  Problem Relation Age of Onset   Anxiety disorder Mother    Depression Mother    Breast cancer Mother 81   Cancer Father    Gallbladder disease Father    Alcohol abuse Father    Depression Father    Bipolar disorder Son      Vitals:   04/30/23 0500 04/30/23 0530 04/30/23 0600 04/30/23 0700  BP: (!) 113/91 (!) 108/51 (!) 108/56 132/84  Pulse:    (!) 125  Resp: 14 16 14 17   Temp:      TempSrc:      SpO2:    97%  Weight:      Height:        PHYSICAL EXAM General: Chronically ill appearing elderly female, intubated and  sedated. HEENT: Normocephalic and atraumatic. Neck: No JVD.  Lungs: Normal respiratory effort on 3L [place].  Heart: Irregularly irregular, fast rate. Normal S1 and S2 without gallops or murmurs.  Abdomen: Non-distended appearing.  Msk: Normal strength and tone for age. Extremities: Warm and well perfused. No clubbing, cyanosis. No edema.  Neuro: Alert and oriented X 3. Psych: Answers questions appropriately.   Labs: Basic Metabolic Panel: Recent Labs    04/29/23 0426 04/29/23 1447 04/30/23 0321 04/30/23 0322  NA 142 142  --  140  K 3.5 3.9  --  4.0  CL 108 105  --  104  CO2 26 27  --  28  GLUCOSE 139* 119*  --  134*  BUN 16 16  --  18  CREATININE 0.94 0.84  --  0.88  CALCIUM 8.4* 7.8*  --  8.7*  MG 2.4  --  2.2  --   PHOS 3.1  --   --  3.9   Liver Function Tests: Recent Labs    04/29/23 0426 04/30/23 0322  ALBUMIN 2.4* 2.1*   No results for input(s): "LIPASE", "AMYLASE" in the last 72 hours. CBC: Recent Labs    04/29/23 0426 04/30/23 0321  WBC 20.1* 10.8*  HGB 9.7* 8.6*  HCT 29.7* 26.8*  MCV 91.4 92.4  PLT 279 165   Cardiac Enzymes: Recent Labs    04/27/23 2347 04/28/23 2214 04/29/23 0808  TROPONINIHS 449* 664* 340*   BNP: Recent Labs    04/28/23 1004  BNP 1,612.0*  D-Dimer: No results for input(s): "DDIMER" in the last 72 hours. Hemoglobin A1C: No results for input(s): "HGBA1C" in the last 72 hours.  Fasting Lipid Panel: Recent Labs    04/29/23 0426  TRIG 105   Thyroid Function Tests: No results for input(s): "TSH", "T4TOTAL", "T3FREE", "THYROIDAB" in the last 72 hours.  Invalid input(s): "FREET3" Anemia Panel: No results for input(s): "VITAMINB12", "FOLATE", "FERRITIN", "TIBC", "IRON", "RETICCTPCT" in the last 72 hours.   Radiology: [facility] Chest Port 1 View Result Date: 04/29/2023 CLINICAL DATA:  78 year old female with respiratory failure. Intubated. EXAM: PORTABLE CHEST 1 VIEW COMPARISON:  Portable chest yesterday and earlier. FINDINGS:  Portable AP view at 0511 hours. Mildly more rotated to the right. Endotracheal tube tip in good position between the clavicles and carina. Visible enteric tube loops in the stomach. Mediastinal contours remain within normal limits. Lung volumes slightly larger. Coarse bilateral pulmonary interstitial opacity with vague, confluent lung base opacity continues. But mildly improved bilateral ventilation since yesterday. No pneumothorax. No areas of worsening ventilation. Stable visualized osseous structures. Paucity of bowel gas. IMPRESSION: 1. Satisfactory endotracheal and enteric tubes. 2. Mildly improved bilateral lung volumes and ventilation since yesterday. Ongoing widespread coarse interstitial and more confluent bilateral lung base opacity. Electronically Signed   By: Odessa Fleming M.D.   On: 04/29/2023 07:59   ECHOCARDIOGRAM COMPLETE Result Date: 04/28/2023    ECHOCARDIOGRAM REPORT   Patient Name:   AALYAH MANSOURI Date of Exam: 04/28/2023 Medical Rec #:  [id number]        Height:       65.0 in Accession #:    [id number]       Weight:       120.0 lb Date of Birth:  31-Dec-1945        BSA:          1.592 m Patient Age:    77 years         BP:           132/64 mmHg Patient Gender: F                HR:           65 bpm. Exam Location:  [facility] Procedure: 2D Echo, Cardiac Doppler and Color Doppler (Both Spectral and Color            Flow Doppler were utilized during procedure). Indications:     Elevated Troponin  History:         Patient has prior history of Echocardiogram examinations, most                  recent 09/09/2022. COPD and Stroke, Arrythmias:Atrial                  Fibrillation, Signs/Symptoms:Dyspnea; Risk                  Factors:Hypertension, Dyslipidemia and Current Smoker. CKD.  Sonographer:     Mikki Harbor Referring Phys:  [id number] Andris Baumann Diagnosing Phys: Yvonne Kendall MD  Sonographer Comments: Echo performed with patient supine and on artificial respirator. Image acquisition challenging due  to respiratory motion. IMPRESSIONS  1. Left ventricular ejection fraction, by estimation, is 55 to 60%. The left ventricle has normal function. The left ventricle has no regional wall motion abnormalities. Left ventricular diastolic parameters were normal.  2. Right ventricular systolic function is mildly reduced. The right ventricular size is normal. Mildly increased right ventricular wall thickness. There is severely elevated pulmonary  artery systolic pressure.  3. Left atrial size was moderately dilated.  4. Moderate pleural effusion in the left lateral region.  5. The mitral valve is abnormal. Moderate to severe mitral valve regurgitation.  6. Tricuspid valve regurgitation is moderate to severe.  7. The aortic valve is tricuspid. There is mild calcification of the aortic valve. There is mild thickening of the aortic valve. Aortic valve regurgitation is mild. Aortic valve sclerosis/calcification is present, without any evidence of aortic stenosis.  8. The inferior vena cava is dilated in size with <50% respiratory variability, suggesting right atrial pressure of 15 mmHg. FINDINGS  Left Ventricle: Left ventricular ejection fraction, by estimation, is 55 to 60%. The left ventricle has normal function. The left ventricle has no regional wall motion abnormalities. The left ventricular internal cavity size was normal in size. There is  borderline left ventricular hypertrophy. Left ventricular diastolic parameters were normal. Right Ventricle: The right ventricular size is normal. Mildly increased right ventricular wall thickness. Right ventricular systolic function is mildly reduced. There is severely elevated pulmonary artery systolic pressure. The tricuspid regurgitant velocity is 3.64 m/s, and with an assumed right atrial pressure of 15 mmHg, the estimated right ventricular systolic pressure is 68.0 mmHg. Left Atrium: Left atrial size was moderately dilated. Right Atrium: Right atrial size was normal in size.  Pericardium: Trivial pericardial effusion is present. Mitral Valve: The mitral valve is abnormal. Mild to moderate mitral annular calcification. Moderate to severe mitral valve regurgitation. MV peak gradient, 7.1 mmHg. The mean mitral valve gradient is 2.0 mmHg. Tricuspid Valve: The tricuspid valve is normal in structure. Tricuspid valve regurgitation is moderate to severe. Aortic Valve: The aortic valve is tricuspid. There is mild calcification of the aortic valve. There is mild thickening of the aortic valve. Aortic valve regurgitation is mild. Aortic valve sclerosis/calcification is present, without any evidence of aortic stenosis. Aortic valve mean gradient measures 4.0 mmHg. Aortic valve peak gradient measures 8.0 mmHg. Aortic valve area, by VTI measures 2.04 cm. Pulmonic Valve: The pulmonic valve was not well visualized. Pulmonic valve regurgitation is not visualized. No evidence of pulmonic stenosis. Aorta: The aortic root and ascending aorta are structurally normal, with no evidence of dilitation. Venous: The inferior vena cava is dilated in size with less than 50% respiratory variability, suggesting right atrial pressure of 15 mmHg. IAS/Shunts: No atrial level shunt detected by color flow Doppler. Additional Comments: There is a moderate pleural effusion in the left lateral region.  LEFT VENTRICLE PLAX 2D LVIDd:         4.70 cm     Diastology LVIDs:         3.30 cm     LV e' medial:    8.59 cm/s LV PW:         1.13 cm     LV E/e' medial:  13.5 LV IVS:        0.80 cm     LV e' lateral:   13.30 cm/s LVOT diam:     1.90 cm     LV E/e' lateral: 8.7 LV SV:         67 LV SV Index:   42 LVOT Area:     2.84 cm  LV Volumes (MOD) LV vol d, MOD A2C: 53.5 ml LV vol d, MOD A4C: 57.5 ml LV vol s, MOD A2C: 20.6 ml LV vol s, MOD A4C: 25.1 ml LV SV MOD A2C:     32.9 ml LV SV MOD A4C:  57.5 ml LV SV MOD BP:      31.8 ml RIGHT VENTRICLE RV Basal diam:  3.00 cm RV Mid diam:    2.30 cm LEFT ATRIUM             Index         RIGHT ATRIUM           Index LA diam:        4.20 cm 2.64 cm/m   RA Area:     14.70 cm LA Vol (A2C):   81.4 ml 51.13 ml/m  RA Volume:   35.50 ml  22.30 ml/m LA Vol (A4C):   76.5 ml 48.05 ml/m LA Biplane Vol: 79.6 ml 50.00 ml/m  AORTIC VALVE                    PULMONIC VALVE AV Area (Vmax):    2.21 cm     PV Vmax:       0.72 m/s AV Area (Vmean):   2.11 cm     PV Peak grad:  2.1 mmHg AV Area (VTI):     2.04 cm AV Vmax:           141.00 cm/s AV Vmean:          91.550 cm/s AV VTI:            0.328 m AV Peak Grad:      8.0 mmHg AV Mean Grad:      4.0 mmHg LVOT Vmax:         110.00 cm/s LVOT Vmean:        68.000 cm/s LVOT VTI:          0.236 m LVOT/AV VTI ratio: 0.72  AORTA Ao Root diam: 3.30 cm Ao Asc diam:  3.20 cm MITRAL VALVE                  TRICUSPID VALVE MV Area (PHT): 5.54 cm       TR Peak grad:   53.0 mmHg MV Area VTI:   1.91 cm       TR Vmax:        364.00 cm/s MV Peak grad:  7.1 mmHg MV Mean grad:  2.0 mmHg       SHUNTS MV Vmax:       1.33 m/s       Systemic VTI:  0.24 m MV Vmean:      56.9 cm/s      Systemic Diam: 1.90 cm MV Decel Time: 137 msec MR Peak grad:    145.9 mmHg MR Mean grad:    91.0 mmHg MR Vmax:         604.00 cm/s MR Vmean:        449.0 cm/s MR PISA:         2.26 cm MR PISA Eff ROA: 35 mm MR PISA Radius:  0.60 cm MV E velocity: 116.00 cm/s MV A velocity: 69.70 cm/s MV E/A ratio:  1.66 Cristal Deer End MD Electronically signed by Yvonne Kendall MD Signature Date/Time: 04/28/2023/7:14:48 PM    Final    DG Chest Port 1 View Result Date: 04/28/2023 CLINICAL DATA:  Intubation EXAM: PORTABLE CHEST 1 VIEW COMPARISON:  Two days ago FINDINGS: Diffuse interstitial opacity with patchy airspace density greatest at the right base, progressed at the right base. There is small volume pleural fluid likely. No pneumothorax. Endotracheal tube with tip between the clavicular heads and carina. An enteric tube reaches the stomach at  least. Normal heart size IMPRESSION: Worsening infiltrate especially  at the right lung base. New hardware in unremarkable position. Electronically Signed   By: Tiburcio Pea M.D.   On: 04/28/2023 10:26   DG Abd 1 View Result Date: 04/28/2023 CLINICAL DATA:  NG placement. EXAM: ABDOMEN - 1 VIEW COMPARISON:  None Available. FINDINGS: Enteric tube with tip in the region of the distal stomach. No bowel dilatation noted. Small bilateral pleural effusions and diffuse interstitial and bibasilar densities. IMPRESSION: Enteric tube with tip in the distal stomach. Electronically Signed   By: Elgie Collard M.D.   On: 04/28/2023 10:23   DG Chest Port 1 View Result Date: 04/26/2023 CLINICAL DATA:  Respiratory failure EXAM: PORTABLE CHEST 1 VIEW COMPARISON:  04/26/2023 at 9:13 a.m. FINDINGS: Increased predominantly interstitial opacities bilaterally greatest in the left mid/upper lung. No pleural effusion or pneumothorax. Stable cardiomediastinal silhouette. Aortic atherosclerotic calcification. IMPRESSION: Increased predominantly interstitial opacities bilaterally greatest in the left mid/upper lung. Findings favor pneumonia superimposed on a background emphysema. Electronically Signed   By: Minerva Fester M.D.   On: 04/26/2023 23:49   DG Chest Port 1 View Result Date: 04/26/2023 CLINICAL DATA:  Cough. Recent antibiotics for pneumonia with resulting diarrhea. History of CVA and atrial fibrillation. EXAM: PORTABLE CHEST 1 VIEW COMPARISON:  None recent. Chest CT 09/13/2022. Radiographs 09/13/2022 and 08/25/2022. FINDINGS: 0913 hours. The heart size and mediastinal contours are stable with mild aortic atherosclerosis. The aeration of the lungs has improved compared with the available prior studies. There are patchy pulmonary opacities bilaterally which could reflect postinflammatory scarring or recurrent infection. There is no consolidation, significant pleural effusion or pneumothorax. The bones appear unchanged status post lower cervical fusion. IMPRESSION: Overall improvement in  bilateral airspace opacities compared with prior studies from August. Patchy pulmonary opacities bilaterally which could reflect postinflammatory scarring or recurrent infection. No consolidation or pleural effusion. Electronically Signed   By: Carey Bullocks M.D.   On: 04/26/2023 13:05    ECHO as above  TELEMETRY reviewed by me 04/30/2023: atrial fibrillation rate 100-110s  EKG reviewed by me: VT 180 bpm  Data reviewed by me 04/30/2023: last 24h vitals tele labs imaging I/O hospitalist progress notes, PCCM notes  Principal Problem:   Acute on chronic respiratory failure with hypoxia and hypercapnia (HCC) Active Problems:   Pulmonary hypertension (HCC)   Chronic diastolic congestive heart failure (HCC)   Depression with suicidal ideation   Asthma with COPD (HCC)   Hypokalemia   Prolonged QT interval   UTI (urinary tract infection)   Atrial fibrillation with slow ventricular response (HCC)   Other specified hypothyroidism   Hypomagnesemia   Suicidal ideation   Diarrhea   Hypophosphatemia   Septic shock (HCC)   Metabolic acidosis   Multifocal pneumonia   Precordial chest pain   Respiratory distress   Protein-calorie malnutrition, severe   Decompensated heart failure (HCC)    ASSESSMENT AND PLAN:  KYLIEE ORTEGO is a 78 y.o. female  with a past medical history of paroxysmal atrial fibrillation on eliquis, hypertension, hyperlipidemia, COPD on home 3L, depression who presented to the ED on 04/25/2023 for suicidal ideation. Has been treated for pneumonia and acute on chronic respiratory failure, decompensated earlier this AM requiring intubation. Was tachycardia and troponins found to be elevated this AM as well. Cardiology was consulted for further evaluation.   # Acute on Chronic respiratory failure # Pneumonia # Ventricular Tachycardia # Demand Ischemia   # Paroxysmal Atrial fibrillation  Patient's respiratory status decompensated  this morning that required intubation and had  VT in 170-180s. Patient received 2x bolus of 150 mg Amio and IV 2g Mg, converted to NSR rate in 70s. Patient is hemodynamically stable. Trops elevated and flat 481 > 449 likely due to demand ischemia. Today labs showed elevated BNP 1612.  Echo with preserved EF, no wall motion abnormalities, mildly reduced RV function, severely elevated PASP, moderate to severe MR and TR, moderate pleural effusion. -Continue IV amiodarone, consider transition to po tomorrow. -Agree with continued IV diuresis, appears to be tolerating this well. -Continue Eliquis for stroke risk reduction  -Continue atorvastatin 40 mg per tube -Continue metoprolol tartrate 12.5 mg bid per tube.  -Further management of respiratory failure per PCCM, possible extubation today. -Discussed the risks and benefits of proceeding with RHC for further evaluation with the patient.  She is agreeable to proceed.  NPO until RHC this morning (04/30/23) with Dr. Darrold Junker.  Written consent will be obtained.  Further recommendations following RHC.     This patient's plan of care was discussed and created with Dr. Darrold Junker and he is in agreement.  Signed: Gale Journey, PA-C  04/30/2023, 7:46 AM [facility]

## 2023-04-30 NOTE — Progress Notes (Signed)
 Patient states that she does not want to harm herself but she is feeling really depressed and her anxiety is getting to her. Making MD aware of same.

## 2023-04-30 NOTE — Progress Notes (Signed)
 Nutrition Follow Up Note   DOCUMENTATION CODES:   Severe malnutrition in context of chronic illness  INTERVENTION:   Ensure Enlive po TID, each supplement provides 350 kcal and 20 grams of protein.  Magic cup TID with meals, each supplement provides 290 kcal and 9 grams of protein  MVI po daily  Pt remains at high refeed risk; recommend monitor potassium, magnesium and phosphorus labs daily until stable  Daily weights   NUTRITION DIAGNOSIS:   Severe Malnutrition related to chronic illness as evidenced by severe fat depletion, severe muscle depletion. -ongoing   GOAL:   Patient will meet greater than or equal to 90% of their needs -previously met with tube feeds   MONITOR:   PO intake, Supplement acceptance, Labs, Weight trends, I & O's, Skin  ASSESSMENT:   78 y.o. female with PMHx significant for CVA with left sided weakness, COPD requiring 3L supplemental O2 at baseline, A. Fib on Eliquis, IBS, GERD, CHF, CKD III, HLD. pulmonary hypertension, thyroid disease, depression and anxiety who was admitted with suicidal ideation and E. Coli UTI complicated by respiratory failure, flash pulmonary edema, AECOPD, multifocal pneumonia, coronavirus and SVT.  Pt s/p heart cath today  Pt extubated yesterday. Pt seen by SLP today and initiated on a mechanical soft diet. RD will add supplements and MVI to help pt meet her estimated needs. Pt remains at refeed risk. Per chart, pt appears weight stable since admission.    Medications reviewed and include: colace, insulin, synthroid, melatonin, solu-medrol, remeron, zofran, protonix, miralax, Mg sulfate, zosyn   Labs reviewed: K 3.7 wnl, P 3.9 wnl, Mg 2.2 wnl Wbc- 10.8(H),  Hgb 9.2(L), Hct 27.0(L) Cbgs- 84, 105, 117 x 24 hrs   Diet Order:   Diet Order             DIET DYS 3 Room service appropriate? Yes with Assist; Fluid consistency: Thin  Diet effective now                  EDUCATION NEEDS:   No education needs have been  identified at this time  Skin:  Skin Assessment: Reviewed RN Assessment  Last BM:  3/30- type 6  Height:   Ht Readings from Last 1 Encounters:  04/24/23 5\' 5"  (1.651 m)    Weight:   Wt Readings from Last 1 Encounters:  04/29/23 54.4 kg    Ideal Body Weight:  56.8 kg  BMI:  Body mass index is 19.97 kg/m.  Estimated Nutritional Needs:   Kcal:  1400-1600kcal/day  Protein:  70-80g/day  Fluid:  1.4-1.6L/day  Betsey Holiday MS, RD, LDN If unable to be reached, please send secure chat to "RD inpatient" available from 8:00a-4:00p daily

## 2023-04-30 NOTE — Progress Notes (Signed)
 SLP Cancellation Note  Patient Details Name: Beth [name] MRN: [id number] DOB: April 20, 1945   Cancelled treatment:       Reason Eval/Treat Not Completed: Patient at procedure or test/unavailable (NPO.  ST services will f/u later today.)      Jerilynn Som, MS, CCC-SLP Speech Language Pathologist Rehab Services; [facility] [place] [phone] (ascom) Salik Grewell 04/30/2023, 9:00 AM

## 2023-04-30 NOTE — Evaluation (Signed)
 Clinical/Bedside Swallow Evaluation Patient Details  Name: Beth [name] MRN: [id number] Date of Birth: 1945-04-23  Today's Date: 04/30/2023 Time: SLP Start Time (ACUTE ONLY): 1210 SLP Stop Time (ACUTE ONLY): 1300 SLP Time Calculation (min) (ACUTE ONLY): 50 min  Past Medical History:  Past Medical History:  Diagnosis Date   Allergy    Anxiety    Asthma    Chronic pain syndrome    discharged from pain clinic, hx of narcotics seeking behavior   COPD (chronic obstructive pulmonary disease) (HCC)    CVA (cerebral infarction)    Depression    Fibromyalgia    Headache    Hyperlipidemia    Hypertension    IBS (irritable bowel syndrome)    Stroke (HCC)    Vitamin D deficiency    Past Surgical History:  Past Surgical History:  Procedure Laterality Date   ABDOMINAL HYSTERECTOMY     APPENDECTOMY     BREAST EXCISIONAL BIOPSY  2011   Pt states lump removed, ? side, no scar seen   BREAST SURGERY  2011   biopsy   CERVICAL DISCECTOMY     CHOLECYSTECTOMY     SINUSOTOMY     HPI:  Pt is a 78 y.o. female with medical history significant for CVA with left-sided residual weakness, depression, anxiety, neuropathy, insomnia, C5-C7 ACDF, atrial fibrillation on Eliquis, COPD on home O2 at 3 L who was brought to the ED after she tried to stab herself with scissors.  She endorses feeling suicidal.  Patient recently dx's w/ a bout of pneumonia for ~1 month (at home), completing antibiotic treatment.  She did endorse diarrhea in the few days prior.  She has had very poor oral intake.  Low potassium.  Was tachycardia and troponins found to be elevated this AM as well. Cardiology was consulted. Converting in and out of atrial fibrillation/sinus rhythm.  Potential UTI - tx f/u per chart.  CXR at admit 3/29: Overall improvement in bilateral airspace opacities compared with  prior studies from August. Patchy pulmonary opacities bilaterally  which could reflect postinflammatory scarring or recurrent   infection. No consolidation or pleural effusion.   CXR 4/2: Satisfactory endotracheal and enteric tubes.  2. Mildly improved bilateral lung volumes and ventilation since  yesterday. Ongoing widespread coarse interstitial and more confluent  bilateral lung base opacity.   OF NOTE: Patient's respiratory status decompensated requiring intubation 04/28/23-04/29/23 and had VT in 170-180s. Patient received 2x bolus of 150 mg Amio and IV 2g Mg, converted to NSR rate in 70s.  Pt extubated successfully and has been maintained on [place] O2 since.    Assessment / Plan / Recommendation  Clinical Impression   Pt seen for BSE today. Pt awake, required verbal cue for follow through w/ tasks at times. Intermittently verbal but paucity of speech noted. Pt appeared weak, cachectic. Son present. Min-Mod support for upright sitting in bed. Sitter present in room.  OF NOTE: MOD+ congested cough and fluctuating HR at Baseline. NSG aware. Son reported "pneumonia now for over 1 month". Pt endorsed a mildly "sore" throat post extubation yesterday -- NSG aware.  On Ray O2 2-3L; afebrile. WBC trending down.   Pt appears to present w/ grossly functional oropharyngeal phase swallowing THOUGH w/ Min concern for deficits in pharyngeal swallowing d/t her Pulmonary decline and overall weakness/deconditioning. Pt also exhibits lengthy mastication effort/time w/ solid foods (attempted) d/t MISSING MANY DENTITION. No immediate, overt clinical s/s of aspiration during po trials, but a congested cough occurred b/t trials moreso  w/ thin liquid trials -- pt has a congested cough Baseline (prior to any po's).  Pt appears at Min increased risk for aspiration/aspiration pneumonia in setting of current comorbidities but this risk can be reduced when following general aspiration precautions w/ oral intake (that is easy to chew/masticate).  However, pt does have challenging factors that could impact her oropharyngeal swallowing to include declined Pulmonary  status, lengthy illness/hospitalization now, deconditioning/weakness, MISSING MANY DENTITION, and prior CVA w/ residual left-sided weakness. These factors can increase risk for dysphagia, aspiration as well as decreased oral intake overall.   During po trials, pt consumed all consistencies w/ no immediate, overt coughing, decline in vocal quality, or change in respiratory presentation during/post trials except w/ congested coughing occurring ~2x b/t trials (liquids) -- this did not increase in frequency as po trials continued. O2 sats remained mid 90s as at baseline. Oral phase appeared [facility] w/ timely bolus management and control of bolus propulsion for A-P transfer for swallowing w/ liquids and purees. Significant increased mastication time/effort noted w/ solid food trial -- pt's request of a cheeseburger. Much Education given on foods easy to chew for conservation of energy in setting of illness and Missing Dentition. Oral clearing achieved w/ all trial consistencies -- moistened, soft foods given.  OM Exam appeared [facility] w/ no unilateral weakness noted. Speech Clear, low volume. Pt fed self w/ full setup support.   Recommend a more mech soft consistency diet w/ well-moistened foods, Meats MINCED w/ gravies; Thin liquids VIA CUP -- carefully monitor and pt should Hold Cup when drinking. Recommend general aspiration precautions including SMALL SIPS SLOWLY; reduced distractions and talking w/ oral intake; Rest Breaks for conservation of energy. Sit fully upright for oral intake. Tray setup and support at meals. Pills CRUSHED vs WHOLE in Puree for safer, easier swallowing -- it was encouraged now and for D/C to the Son.  Education given on Pills in Puree; food consistencies and easy to eat options for support; general aspiration precautions to pt and Son. ST services will continue to monitor for toleration of diet, thin liquids w/ objective swallow assessment as indicated.  MD/NSG updated, agreed. Recommend  Dietician f/u for support. SLP Visit Diagnosis: Dysphagia, unspecified (R13.10) (lengthy illness)    Aspiration Risk  Mild aspiration risk;Risk for inadequate nutrition/hydration (reduced following precautions)    Diet Recommendation   Thin;Dysphagia 3 (mechanical soft);Age appropriate regular (cut/moistened foods -- easy to chew/eat for conservation of energy d/t Missing Dentition and declined Pulmonary status) = a more mech soft consistency diet w/ well-moistened foods, Meats MINCED w/ gravies; Thin liquids VIA CUP -- carefully monitor and pt should Hold Cup when drinking. Recommend general aspiration precautions including SMALL SIPS SLOWLY; reduced distractions and talking w/ oral intake; Rest Breaks for conservation of energy. Sit fully upright for oral intake. Tray setup and support at meals.   Medication Administration: Whole meds with puree (vs CRUSHED in Puree)    Other  Recommendations Recommended Consults:  (Dietician f/u) Oral Care Recommendations: Oral care BID;Oral care before and after PO;Staff/trained caregiver to provide oral care;Patient independent with oral care    Recommendations for follow up therapy are one component of a multi-disciplinary discharge planning process, led by the attending physician.  Recommendations may be updated based on patient status, additional functional criteria and insurance authorization.  Follow up Recommendations  (TBD)      Assistance Recommended at Discharge  FULL currently  Functional Status Assessment Patient has had a recent decline in their functional status  and demonstrates the ability to make significant improvements in function in a reasonable and predictable amount of time.  Frequency and Duration min 1 x/week  1 week       Prognosis Prognosis for improved oropharyngeal function: Fair Barriers to Reach Goals: Time post onset;Severity of deficits Barriers/Prognosis Comment: Baseline comorbidities      Swallow Study   General  Date of Onset: 04/26/23 HPI: Pt is a 78 y.o. female with medical history significant for CVA with left-sided residual weakness, depression, anxiety, neuropathy, insomnia, C5-C7 ACDF, atrial fibrillation on Eliquis, COPD on home O2 at 3 L who was brought to the ED after she tried to stab herself with scissors.  She endorses feeling suicidal.  Patient recently dx's w/ a bout of pneumonia for ~1 month (at home), completing antibiotic treatment.  She did endorse diarrhea in the few days prior.  She has had very poor oral intake.  Low potassium.  Was tachycardia and troponins found to be elevated this AM as well. Cardiology was consulted. Converting in and out of atrial fibrillation/sinus rhythm.   CXR at admit 3/29: Overall improvement in bilateral airspace opacities compared with  prior studies from August. Patchy pulmonary opacities bilaterally  which could reflect postinflammatory scarring or recurrent  infection. No consolidation or pleural effusion.     CXR 4/2: Satisfactory endotracheal and enteric tubes.  2. Mildly improved bilateral lung volumes and ventilation since  yesterday. Ongoing widespread coarse interstitial and more confluent  bilateral lung base opacity.  OF NOTE: Patient's respiratory status decompensated this morning that required intubation 04/28/23-04/29/23 and had VT in 170-180s. Patient received 2x bolus of 150 mg Amio and IV 2g Mg, converted to NSR rate in 70s.  Pt extubated successfully and has been maintained on [place] O2 since. Type of Study: Bedside Swallow Evaluation Previous Swallow Assessment: none Diet Prior to this Study: Regular;Thin liquids (Level 0) Temperature Spikes Noted: No (wbc 10.8 trending down) Respiratory Status: Nasal cannula (2-3L) History of Recent Intubation: Yes Total duration of intubation (days): 1 days Date extubated: 04/29/23 Behavior/Cognition: Alert;Cooperative;Pleasant mood;Distractible;Requires cueing Oral Cavity Assessment: Within Functional Limits Oral  Care Completed by SLP: Yes Oral Cavity - Dentition: Missing dentition (MANY) Vision: Functional for self-feeding Self-Feeding Abilities: Able to feed self;Needs assist;Needs set up Patient Positioning: Upright in bed (Min-Mod assist) Baseline Vocal Quality: Low vocal intensity (mild sore throat post extubation - NSG aware) Volitional Cough: Strong;Congested Volitional Swallow: Able to elicit    Oral/Motor/Sensory Function Overall Oral Motor/Sensory Function: Within functional limits   Ice Chips Ice chips: Within functional limits Presentation: Spoon (fed; 2 trials)   Thin Liquid Thin Liquid: Impaired (?) Presentation: Cup;Self Fed (12+ trials) Oral Phase Impairments:  (wfl) Pharyngeal  Phase Impairments: Cough - Delayed (x2 during po trials - unsure of related to pt's Baseline congested coughing?)    Nectar Thick Nectar Thick Liquid: Not tested   Honey Thick Honey Thick Liquid: Not tested   Puree Puree: Within functional limits Presentation: Self Fed;Spoon (8-9 trials)   Solid     Solid: Impaired Presentation: Self Fed (1 trial) Oral Phase Impairments: Impaired mastication Oral Phase Functional Implications: Impaired mastication (lengthy - missing MANY teeth) Pharyngeal Phase Impairments:  (none)       Jerilynn Som, MS, CCC-SLP Speech Language Pathologist Rehab Services; [facility] [phone] (ascom) Fifi Schindler 04/30/2023,5:49 PM

## 2023-04-30 NOTE — Progress Notes (Signed)
 MD notified about poor PO intake and decreased urine output of 325 ml this shift.

## 2023-04-30 NOTE — Progress Notes (Signed)
 Patient transported via bed by with cardiac monitor in place by Alger Simons to specials recovery for procedure.

## 2023-04-30 NOTE — Plan of Care (Signed)
  Problem: Health Behavior/Discharge Planning: Goal: Ability to manage health-related needs will improve Outcome: Progressing   Problem: Activity: Goal: Risk for activity intolerance will decrease Outcome: Progressing   Problem: Nutrition: Goal: Adequate nutrition will be maintained Outcome: Progressing   Problem: Elimination: Goal: Will not experience complications related to bowel motility Outcome: Progressing   Problem: Pain Managment: Goal: General experience of comfort will improve and/or be controlled Outcome: Progressing   Problem: Safety: Goal: Ability to remain free from injury will improve Outcome: Progressing   Problem: Coping: Goal: Ability to adjust to condition or change in health will improve Outcome: Progressing   Problem: Fluid Volume: Goal: Ability to maintain a balanced intake and output will improve Outcome: Progressing   Problem: Metabolic: Goal: Ability to maintain appropriate glucose levels will improve Outcome: Progressing   Problem: Nutritional: Goal: Maintenance of adequate nutrition will improve Outcome: Progressing Goal: Progress toward achieving an optimal weight will improve Outcome: Progressing

## 2023-04-30 NOTE — Progress Notes (Signed)
 Patient refused to reposition or turn.

## 2023-05-01 ENCOUNTER — Encounter: Payer: Self-pay | Admitting: Cardiology

## 2023-05-01 DIAGNOSIS — J9622 Acute and chronic respiratory failure with hypercapnia: Secondary | ICD-10-CM | POA: Diagnosis not present

## 2023-05-01 DIAGNOSIS — F411 Generalized anxiety disorder: Secondary | ICD-10-CM | POA: Diagnosis not present

## 2023-05-01 DIAGNOSIS — F329 Major depressive disorder, single episode, unspecified: Secondary | ICD-10-CM | POA: Diagnosis not present

## 2023-05-01 DIAGNOSIS — F339 Major depressive disorder, recurrent, unspecified: Secondary | ICD-10-CM | POA: Insufficient documentation

## 2023-05-01 DIAGNOSIS — R45851 Suicidal ideations: Secondary | ICD-10-CM

## 2023-05-01 DIAGNOSIS — J9621 Acute and chronic respiratory failure with hypoxia: Secondary | ICD-10-CM | POA: Diagnosis not present

## 2023-05-01 LAB — CULTURE, BLOOD (ROUTINE X 2)
Culture: NO GROWTH
Culture: NO GROWTH
Special Requests: ADEQUATE

## 2023-05-01 LAB — BLOOD GAS, VENOUS
Bicarbonate: 26.4 mmol/L — ABNORMAL HIGH (ref 20.0–28.0)
Delivery systems: POSITIVE
Patient temperature: 37 mmol/L — ABNORMAL HIGH (ref 0.0–2.0)
pCO2, Ven: 63 mmHg — ABNORMAL HIGH (ref 44–60)
pH, Ven: 7.23 — ABNORMAL LOW (ref 7.25–7.43)
pO2, Ven: 26.4 mmol/L (ref 32–45)

## 2023-05-01 LAB — CULTURE, RESPIRATORY W GRAM STAIN

## 2023-05-01 LAB — RENAL FUNCTION PANEL
Albumin: 2.2 g/dL — ABNORMAL LOW (ref 3.5–5.0)
Anion gap: 7 (ref 5–15)
BUN: 21 mg/dL (ref 8–23)
CO2: 30 mmol/L (ref 22–32)
Calcium: 8.9 mg/dL (ref 8.9–10.3)
Chloride: 106 mmol/L (ref 98–111)
Creatinine, Ser: 0.65 mg/dL (ref 0.44–1.00)
GFR, Estimated: >60 mL/min (ref >60)
Glucose, Bld: 175 mg/dL — ABNORMAL HIGH (ref 70–99)
Phosphorus: 2.6 mg/dL (ref 2.5–4.6)
Potassium: 3.5 mmol/L (ref 3.5–5.1)
Sodium: 143 mmol/L (ref 135–145)

## 2023-05-01 LAB — CBC
HCT: 26.4 % — ABNORMAL LOW (ref 36.0–46.0)
Hemoglobin: 8.5 g/dL — ABNORMAL LOW (ref 12.0–15.0)
MCH: 29.7 pg (ref 26.0–34.0)
MCHC: 32.2 g/dL (ref 30.0–36.0)
MCV: 92.3 fL (ref 80.0–100.0)
Platelets: 165 10*3/uL (ref 150–400)
RBC: 2.86 MIL/uL — ABNORMAL LOW (ref 3.87–5.11)
RDW: 16.6 % — ABNORMAL HIGH (ref 11.5–15.5)
WBC: 10.4 10*3/uL (ref 4.0–10.5)
nRBC: 0 % (ref 0.0–0.2)

## 2023-05-01 LAB — MAGNESIUM: Magnesium: 2.6 mg/dL — ABNORMAL HIGH (ref 1.7–2.4)

## 2023-05-01 LAB — GLUCOSE, CAPILLARY
Glucose-Capillary: 117 mg/dL — ABNORMAL HIGH (ref 70–99)
Glucose-Capillary: 145 mg/dL — ABNORMAL HIGH (ref 70–99)
Glucose-Capillary: 152 mg/dL — ABNORMAL HIGH (ref 70–99)
Glucose-Capillary: 161 mg/dL — ABNORMAL HIGH (ref 70–99)
Glucose-Capillary: 170 mg/dL — ABNORMAL HIGH (ref 70–99)
Glucose-Capillary: 86 mg/dL (ref 70–99)

## 2023-05-01 LAB — HEMOGLOBIN AND HEMATOCRIT, BLOOD
HCT: 28.6 % — ABNORMAL LOW (ref 36.0–46.0)
Hemoglobin: 9.2 g/dL — ABNORMAL LOW (ref 12.0–15.0)

## 2023-05-01 MED ORDER — LAMOTRIGINE 25 MG PO TABS
25.0000 mg | ORAL_TABLET | Freq: Every day | ORAL | Status: DC
  Administered 2023-05-02 – 2023-05-04 (×3): 25 mg via ORAL
  Filled 2023-05-01 (×3): qty 1

## 2023-05-01 MED ORDER — AMIODARONE HCL 200 MG PO TABS
200.0000 mg | ORAL_TABLET | Freq: Every day | ORAL | Status: DC
Start: 2023-05-11 — End: 2023-05-05

## 2023-05-01 MED ORDER — FLUOXETINE HCL 20 MG/5ML PO SOLN
20.0000 mg | Freq: Every day | ORAL | Status: DC
  Administered 2023-05-02 – 2023-05-03 (×2): 20 mg via ORAL
  Filled 2023-05-01 (×2): qty 5

## 2023-05-01 MED ORDER — POTASSIUM CHLORIDE CRYS ER 20 MEQ PO TBCR
40.0000 meq | EXTENDED_RELEASE_TABLET | Freq: Once | ORAL | Status: AC
  Administered 2023-05-01: 40 meq via ORAL
  Filled 2023-05-01: qty 2

## 2023-05-01 MED ORDER — FUROSEMIDE 10 MG/ML IJ SOLN
40.0000 mg | Freq: Once | INTRAMUSCULAR | Status: AC
  Administered 2023-05-01: 40 mg via INTRAVENOUS
  Filled 2023-05-01: qty 4

## 2023-05-01 MED ORDER — AMIODARONE HCL 200 MG PO TABS
200.0000 mg | ORAL_TABLET | Freq: Two times a day (BID) | ORAL | Status: DC
  Administered 2023-05-01 – 2023-05-05 (×9): 200 mg via ORAL
  Filled 2023-05-01 (×9): qty 1

## 2023-05-01 NOTE — Progress Notes (Addendum)
 Progress Note   Patient: Beth [name] ZOX:[id number] DOB: 06/09/1945 DOA: 04/25/2023     6 DOS: the patient was seen and examined on 05/01/2023   Brief hospital course: 78 y.o. female with medical history significant for CVA with left-sided residual weakness, depression, anxiety, neuropathy, insomnia, atrial fibrillation on Eliquis, COPD on home O2 at 3 L who was brought to the ED after she tried to stab herself with scissors.  She endorses feeling suicidal.  Patient recently recovered from a bout of pneumonia, completing antibiotic treatment.  She did endorse diarrhea in the few days prior.  She has had very poor oral intake. ED course and data review: BP slightly elevated on arrival at 170/72, pulse in the 50s otherwise normal vitals Labs notable for potassium of 2.2 and magnesium 1.6.  TSH slightly elevated at 5.528.  Free T4 also elevated at 1.66 Urinalysis with moderate leukocytes CBC WNL, BMP except for potassium WNL. UDS, salicylate, EtOH and acetaminophen levels within normal parameters EKG, personally reviewed and interpreted showing sinus bradycardia at 51 and prolonged QT to 648   Patient was started on ceftriaxone for possibility of UTI.  Also given oral and IV potassium repletion.   Seen by behavioral health,    Hospitalist consulted for admission.   3/28.  Potassium still low this morning IV and oral potassium ordered.  3/29.  Psychiatry changed her medications over to Remeron at night secondary to prolonged QT.  Electrolytes much improved.  Patient felt short of breath this morning.  Chest x-ray showing overall improvement in bilateral airspace opacities from prior studies.  Patient had worsening shortness of breath and was acidotic and sent to the ICU for BiPAP and started on aggressive antibiotics and steroids.  3/30.  Patient did not like the BiPAP mask.  Placed back on nasal cannula.  On IV fluids.  Continue Zosyn.  Discontinue vancomycin with MRSA PCR being negative.   Continue Solu-Medrol.  3/31 -- pt acutely decompensated with flash pulmonary edema, in addition to SVT/VT followed by A-fib with RVR.  Pt had to emergently intubated to transferred to ICU.  In ICU, Cardiology was consulted.  Patient was aggressively diuresed and placed on IV amiodarone. Tested + for non-Covid coronoavirus OC43. Extubated 4/1.  4/2 -- TRH resumed care, pt extubated yesterday.   RHC cath today, see report and Cardiology recommendations.   Assessment and Plan:  Acute on chronic respiratory failure with hypoxia and hypercapnia  Required BiPAP >> emergent intubation on 3/31, extubated 4/1 after aggressive diuresis for flash pulmonary edema --Mgmt of underlying conditions as below --O2 per protocol, wean as tolerated --BiPAP PRN --Pulmonary hygiene   Septic shock - resolved off pressors Multifocal pneumonia, tachypnea, leukocytosis and elevated lactic acid.  Given IV fluids per sepsis on admission. COPD with acute exacerbation Hx of Asthma/COPD --Continue IV antibiotics --IV steroids --Bronchodilators --Mucolytics and other supportive care per orders   Flash Pulmonary Edema - required intubation on 3/31, aggressively diuresed, extubated 4/1. Acute on Chronic diastolic congestive heart failure  SVT  / V Tach A-Fib with RVR Demand Ischemia Hx of Paroxysmal A-fib --Cardiology following - see ercs --IV amiodarone >> PO today --Eliquis for stroke risk reduction --Lipitor --Metoprolol was stopped --Daily weights & strict Io's --IV diuresis has been stopped, per cardiology --Monitor renal function, electrolytes --RHC cath 4/2 - see report  Metabolic acidosis - resolved --Monitor BMP   Hypophosphatemia Replaced   Hypomagnesemia Replaced   Hypokalemia Replaced   UTI (urinary tract infection) Urine culture only  20k colonies E. Coli   Prolonged QT interval Avoid QT prolonging drugs Serial ekg's Maintain K>4, Mg>2 Telemetry   Suicidal ideations -  POA Depression and Anxiety Psychiatry consulted 3/28 - recommended inpatient psych admission when medically cleared.  IVC was only recommended in the event pt attempted to leave the hospital, but appears patient is still under IVC. --One-to-one observation.   --Psychiatry to re-assess this afternoon --On Buspar 10 mg BID, Prozac 20 mg daily, Trazodone at bedtime --Remeron 7.5 mg at bedtime was added by psychiatry   Atrial fibrillation with slow ventricular response (HCC) --Management per Cardiology --Continue Eliquis --Metoprolol has been stopped --Amiodarone drip >> PO today -- per Cardiology --Telemetry --Maintain K>4, Mg>2   Diarrhea Stool studies negative   Other specified hypothyroidism Both TSH and T4 elevated --Continue Synthroid 25 mcg    Pulmonary hypertension  Control blood pressure. O2 per protocol. Monitor hemodynamics closely.      Subjective: Pt seen in stepdown this AM, no family present at the time.  RN staff reporting minimal PO intake, reduced urine output, pt more withdrawn and not wanting care.   Pt denies shortness of breath or feeling sick or having pain for me.  Does not express complaints but is less talkative today.  Physical Exam: Vitals:   05/01/23 0900 05/01/23 1000 05/01/23 1100 05/01/23 1200  BP: (!) 138/115 (!) 114/51 132/70 (!) 120/57  Pulse: 61 (!) 50 (!) 58 (!) 51  Resp: (!) 27 19 18 17   Temp:    98.7 F (37.1 C)  TempSrc:    Axillary  SpO2: 99% 94% 97% 97%  Weight:      Height:       General exam: awake, drowsy appearing, no acute distress, frail and chronically ill appearing HEENT: moist mucus membranes, hearing grossly normal  Respiratory system: CTAB diminished due to poor inspiratory volumes, no wheezes, rales or rhonchi, normal respiratory effort. Cardiovascular system: normal S1/S2, irregularly irregular, no pedal edema.   Gastrointestinal system: soft, NT, ND Central nervous system: A&O x 3. no gross focal neurologic  deficits, normal speech Extremities: moves all, no edema, normal tone Skin: dry, intact, normal temperature Psychiatry: depressed mood, congruent affect, withdrawn and less interactive today  Data Reviewed:  Notable labs --  Hbg improved 8.6 >> 9.2 BMP normal except glucose 134, Ca 8.7 Albumin 2.1  Family Communication: at bedside on rounds 4/2.  None present today, will attempt to call as time allows   Disposition: Status is: Inpatient Remains inpatient appropriate because: severity of illness as above, on IV therapies, HR's uncontrolled, ongoing evaluation with cardiology   Planned Discharge Destination: Skilled nursing facility    Time spent: 50 minutes  Author: Pennie Banter, DO 05/01/2023 1:48 PM  For on call review [email or web address].

## 2023-05-01 NOTE — Progress Notes (Signed)
 Patient more sleepy today, will wake up for medications and goes back to sleep. Patient refused to be repositioned. Patient refused breakfast. MD aware.

## 2023-05-01 NOTE — Consult Note (Signed)
 [facility] Consult Follow up  Patient Name: .Beth [name]  MRN: [id number]  DOB: 01/23/1946  Consult Order details:  Orders (From admission, onward)     Start     Ordered   04/25/23 0102  IP CONSULT TO PSYCHIATRY       Ordering Provider: Pilar Jarvis, MD  Provider:  (Not yet assigned)  Question Answer Comment  Place call to: ED   Reason for Consult Admit      04/25/23 0101             Mode of Visit: In person    Psychiatry Consult Evaluation  Service Date: May 01, 2023 LOS:  LOS: 6 days  Chief Complaint "I didn't want to live anymore"  Primary Psychiatric Diagnoses  MDD 2.  GAD   Assessment  Beth [name] is a 78 y.o. female admitted: Medicallyfor 04/25/2023 12:02 AM had initially presented to the ED with chief complaints of suicide attempt by attempting to stab herself with scissors, reports of diarrhea starting a few days prior to presentation, and found to be hypokalemic, low magnesium, possible UTI, prolonged QTc. She carries the psychiatric diagnoses of depression, anxiety and has a past medical history of CVA with left-sided residual weakness, neuropathy, insomnia, atrial fibrillation on Eliquis, COPD on home O2 at 3 L, HTN, CHF . Psychiatry was consulted to evaluate for safety and treat. Today patient reports depression over her life stressors of losing her dog and going through medical problems. She wants to be independent and is having hard time accepting her physical limitations.She denies SI/HI/intent/plan.  Diagnoses:  Active Hospital problems: Principal Problem:   Acute on chronic respiratory failure with hypoxia and hypercapnia (HCC) Active Problems:   Pulmonary hypertension (HCC)   Chronic diastolic congestive heart failure (HCC)   Depression with suicidal ideation   Asthma with COPD (HCC)   Hypokalemia   Prolonged QT interval   UTI (urinary tract infection)   Atrial fibrillation with slow ventricular response (HCC)   Other  specified hypothyroidism   Hypomagnesemia   Suicidal ideation   Diarrhea   Hypophosphatemia   Septic shock (HCC)   Metabolic acidosis   Multifocal pneumonia   Precordial chest pain   Respiratory distress   Protein-calorie malnutrition, severe   Decompensated heart failure (HCC)    Plan   ## Psychiatric Medication Recommendations:  Prozac titrate dup to 20 mg daily to help with depression ( QTc is WNL) Buspar 10 mg BID Lamictal increased to 25 mg daily for mood stabilization Remeron 7.5 mg HS for depression/sleep/appetite Continue with 1:1 sitter, uphold IVC, will continue to follow and reassess for need to admit to inpatient [facility] unit   ## Medical Decision Making Capacity: Not specifically addressed in this encounter  ## Further Work-up:  --  EKG  EKG on 04/27/23 Qtc 368 -- Pertinent labwork reviewed earlier this admission includes: CBC, electrolytes repleted WNL    ## Disposition:-- Plan Post Discharge/Psychiatric Care Follow-up resources recommend 72 hr observation, uphold IVC at this time, continue 1:1 sitter  - will consider discontinuing IVC if patient continues to maintain safety  ## Behavioral / Environmental: - No specific recommendations at this time.     ## Safety and Observation Level:  - Based on my clinical evaluation, I estimate the patient to be at moderate risk of self harm in the current setting. - At this time, we recommend  1:1 Observation. This decision is based on my review of the chart including patient's history and  current presentation, interview of the patient, mental status examination, and consideration of suicide risk including evaluating suicidal ideation, plan, intent, suicidal or self-harm behaviors, risk factors, and protective factors. This judgment is based on our ability to directly address suicide risk, implement suicide prevention strategies, and develop a safety plan while the patient is in the clinical setting. Please contact our team if  there is a concern that risk level has changed.  CSSR Risk Category:C-SSRS RISK CATEGORY: [place]  Suicide Risk Assessment: Patient has following modifiable risk factors for suicide: social isolation, which we are addressing by continuing 1:1 sitter level of observation at this time. Patient has following non-modifiable or demographic risk factors for suicide: history of suicide attempt and psychiatric hospitalization Patient has the following protective factors against suicide: Access to outpatient mental health care and Supportive family  Thank you for this consult request. Recommendations have been communicated to the primary team.  We will continue to follow pt  at this time.   Verner Chol, MD       History of Present Illness  Relevant Aspects of [facility] Course:  Admitted on 04/25/2023 for chief complaints of suicide attempt by attempting to stab herself with scissors, reports of diarrhea starting a few days prior to presentation, and found to be hypokalemic, low magnesium, possible UTI, prolonged Qtc.  Patient was started on ceftriaxone for possible UTI and urine culture ordered, oral and IV repletion of potassium, oral repletion of magnesium, was seen in the emergency department by psychiatry and started on Prozac and Zyprexa.  TSH and T4 were found to be elevated, continued on Synthroid 25 mcg daily.  Per history of chronic diastolic congestive heart failure, Lasix was held as well as metoprolol due to bradycardia.  Today on interview patient reports feeling depressed over her medical problems.  She was able to acknowledge that she is in a hospital in [place] but was unable to come up with the name of the hospital.  She acknowledged that she had to be intubated as she had respiratory problems and ended up with cardiac problems.  Patient continues to endorse depression stating she wants to be independent and she does not want to go back to the nursing home.  She wants to go to  an apartment with assistance.  She then expressed her sadness about losing her dog who recently died.  Provider discussed extensively about mindfulness, focusing on the current, phases of life with aging.  She reports she wants to get better she wants to get strong and independent.  She denies suicidal/homicidal ideation/intent/plan.  She denies auditory/visual hallucinations.  She wants her medications to be adjusted to help with her depression.  Provider discussed the medication changes of increasing the dosages to help with the mood.  Psych ROS:  Depression: anhedonia, difficulties sleeping, reduced appetite, depressed mood, hopelessness, guilt Anxiety:  excessive worries  Mania (lifetime and current): pt denies Psychosis: (lifetime and current): denies   Collateral information:    Patient's son [name] is reported to be POA.  Review of Systems  Psychiatric/Behavioral:  Positive for depression. The patient is nervous/anxious and has insomnia.      Psychiatric and Social History  Psychiatric History:  Information collected from pt  Prev Dx/Sx: depression, anxiety  Current Psych Provider: provider via nursing facility Home Meds (current): per chart review: Buspar 10 mg BID, Prozac 10 mg daily, lamotrigine 12.5 mg daily, Trazodone 50 mg HS Previous Med Trials: pt reports Klonopin, remeron Therapy: denies current   Prior  Psych Hospitalization: reports multiple  Prior Self Harm: pt denies  Prior Violence: pt denies   Family Psych History: parents - depression, son - depression (per chart review, bipolar)/ reports that her  Family Hx suicide: pt reports maternal and paternal cousin   Social History:  Developmental Hx: normal Educational Hx: some college 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  Living Situation: nursing home, [facility] x6 months  Spiritual Hx: Christian  Access to weapons/lethal means: denies    Substance History Alcohol: denies  Type of alcohol   denies Last Drink denies Number of drinks per day denies History of alcohol withdrawal seizures denies History of DT's denies Tobacco: yes Illicit drugs: denies Prescription drug abuse: denies Rehab hx: denies  Exam Findings   Vital Signs:  Temp:  [98 F (36.7 C)-99.9 F (37.7 C)] 98.7 F (37.1 C) (04/03 1200) Pulse Rate:  [45-83] 51 (04/03 1200) Resp:  [13-32] 17 (04/03 1200) BP: (109-144)/(50-115) 120/57 (04/03 1200) SpO2:  [91 %-99 %] 97 % (04/03 1200) Weight:  [57.8 kg] 57.8 kg (04/03 0500) Blood pressure (!) 120/57, pulse (!) 51, temperature 98.7 F (37.1 C), temperature source Axillary, resp. rate 17, height 5\' 5"  (1.651 m), weight 57.8 kg, SpO2 97%. Body mass index is 21.2 kg/m.   Mental Status Exam: General Appearance: Disheveled  Orientation:  Full (Time, Place, and Person)  Memory:  Immediate;   Fair Recent;   Fair Remote;   Fair  Concentration:  Concentration: Good and Attention Span: Good  Recall:  Fair  Attention  Good  Eye Contact:  Good  Speech:  Normal Rate  Language:  Fair  Volume:  Normal  Mood: "depressed"  Affect:  Congruent  Thought Process:  Coherent and Linear  Thought Content:  WDL  Suicidal Thoughts:  No  Homicidal Thoughts:  No  Judgement:  Fair  Insight:  Fair  Psychomotor Activity:  Normal  Akathisia:  NA  Fund of Knowledge:  Fair      Assets:  Architect Housing  Cognition:  WNL  ADL's:  Impaired  AIMS (if indicated):        Other History   These have been pulled in through the EMR, reviewed, and updated if appropriate.  Family History:  The patient's family history includes Alcohol abuse in her father; Anxiety disorder in her mother; Bipolar disorder in her son; Breast cancer (age of onset: 50) in her mother; Cancer in her father; Depression in her father and mother; Gallbladder disease in her father.  Medical History: Past Medical History:  Diagnosis Date   Allergy    Anxiety     Asthma    Chronic pain syndrome    discharged from pain clinic, hx of narcotics seeking behavior   COPD (chronic obstructive pulmonary disease) (HCC)    CVA (cerebral infarction)    Depression    Fibromyalgia    Headache    Hyperlipidemia    Hypertension    IBS (irritable bowel syndrome)    Stroke (HCC)    Vitamin D deficiency     Surgical History: Past Surgical History:  Procedure Laterality Date   ABDOMINAL HYSTERECTOMY     APPENDECTOMY     BREAST EXCISIONAL BIOPSY  2011   Pt states lump removed, ? side, no scar seen   BREAST SURGERY  2011   biopsy   CERVICAL DISCECTOMY     CHOLECYSTECTOMY     RIGHT HEART CATH N/A 04/30/2023   Procedure: RIGHT HEART CATH;  Surgeon: Darrold Junker,  Lyn Hollingshead, MD;  Location: [facility] INVASIVE CV LAB;  Service: Cardiovascular;  Laterality: N/A;   SINUSOTOMY       Medications:   Current Facility-Administered Medications:    0.9 %  sodium chloride infusion, 250 mL, Intravenous, PRN, Paraschos, Alexander, MD   acetaminophen (TYLENOL) tablet 650 mg, 650 mg, Oral, Q6H PRN **OR** acetaminophen (TYLENOL) suppository 650 mg, 650 mg, Rectal, Q6H PRN, Andris Baumann, MD   acetaminophen (TYLENOL) tablet 650 mg, 650 mg, Oral, Q4H PRN, Paraschos, Alexander, MD   amiodarone (NEXTERONE PREMIX) 360-4.14 MG/200ML-% (1.8 mg/mL) IV infusion, 30 mg/hr, Intravenous, Continuous, Hudson, Caralyn, PA-C, Last Rate: 16.67 mL/hr at 05/01/23 1200, 30 mg/hr at 05/01/23 1200   amiodarone (PACERONE) tablet 200 mg, 200 mg, Oral, BID, 200 mg at 05/01/23 0848 **FOLLOWED BY** [START ON 05/11/2023] amiodarone (PACERONE) tablet 200 mg, 200 mg, Oral, Daily, Hudson, Caralyn, PA-C   apixaban (ELIQUIS) tablet 5 mg, 5 mg, Oral, BID, Netta Neat, RPH, 5 mg at 05/01/23 0846   atorvastatin (LIPITOR) tablet 40 mg, 40 mg, Oral, QHS, Netta Neat, RPH, 40 mg at 04/30/23 2146   busPIRone (BUSPAR) tablet 10 mg, 10 mg, Oral, BID, Esaw Grandchild A, DO, 10 mg at 05/01/23 0845   Chlorhexidine  Gluconate Cloth 2 % PADS 6 each, 6 each, Topical, Daily, Alford Highland, MD, 6 each at 04/29/23 1610   chlorpheniramine-HYDROcodone (TUSSIONEX) 10-8 MG/5ML suspension 5 mL, 5 mL, Oral, Q12H PRN, Mansy, Jan A, MD, 5 mL at 04/30/23 2352   docusate sodium (COLACE) capsule 100 mg, 100 mg, Oral, BID, Denton Lank, Kelly A, DO   feeding supplement (ENSURE ENLIVE / ENSURE PLUS) liquid 237 mL, 237 mL, Oral, TID BM, Griffith, Kelly A, DO, 237 mL at 05/01/23 0843   fentaNYL (SUBLIMAZE) bolus via infusion 25-100 mcg, 25-100 mcg, Intravenous, Q15 min PRN, Tukov-Yual, Magdalene S, NP, 100 mcg at 04/29/23 0423   fentaNYL (SUBLIMAZE) injection 25 mcg, 25 mcg, Intravenous, Once, Tukov-Yual, Magdalene S, NP   fentaNYL in NS (41mcg/ml) infusion-PREMIX, 25-200 mcg/hr, Intravenous, Continuous, Tukov-Yual, Magdalene S, NP, Stopped at 04/29/23 0735   FLUoxetine (PROZAC) 20 MG/5ML solution 15 mg, 15 mg, Oral, Daily, Esaw Grandchild A, DO, 15 mg at 05/01/23 0843   fluticasone furoate-vilanterol (BREO ELLIPTA) 200-25 MCG/ACT 1 puff, 1 puff, Inhalation, Daily, 1 puff at 05/01/23 0851 **AND** umeclidinium bromide (INCRUSE ELLIPTA) 62.5 MCG/ACT 1 puff, 1 puff, Inhalation, Daily, Lindajo Royal V, MD, 1 puff at 05/01/23 0851   gabapentin (NEURONTIN) capsule 100 mg, 100 mg, Oral, TID, Esaw Grandchild A, DO, 100 mg at 05/01/23 0847   guaiFENesin (MUCINEX) 12 hr tablet 600 mg, 600 mg, Oral, Q12H, Esaw Grandchild A, DO, 600 mg at 05/01/23 0847   HYDROcodone-acetaminophen (NORCO/VICODIN) 5-325 MG per tablet 1-2 tablet, 1-2 tablet, Oral, Q4H PRN, Andris Baumann, MD, 2 tablet at 04/29/23 2155   insulin aspart (novoLOG) injection 0-9 Units, 0-9 Units, Subcutaneous, Q4H, Manuela Schwartz, NP, 1 Units at 05/01/23 1251   ipratropium-albuterol (DUONEB) 0.5-2.5 (3) MG/3ML nebulizer solution 3 mL, 3 mL, Nebulization, Q4H PRN, Manuela Schwartz, NP, 3 mL at 04/28/23 9604   lamoTRIgine (LAMICTAL) tablet 12.5 mg, 12.5 mg, Oral, Daily,  Esaw Grandchild A, DO, 12.5 mg at 05/01/23 0846   levothyroxine (SYNTHROID) tablet 12.5 mcg, 12.5 mcg, Oral, Q0600, Netta Neat, RPH, 12.5 mcg at 05/01/23 5409   LORazepam (ATIVAN) tablet 0.5 mg, 0.5 mg, Oral, Q8H PRN, Esaw Grandchild A, DO   melatonin tablet 5 mg, 5 mg, Oral, QHS, Jerrilyn Cairo  M, RPH, 5 mg at 04/30/23 2145   methylPREDNISolone sodium succinate (SOLU-MEDROL) 40 mg/mL injection 40 mg, 40 mg, Intravenous, Q12H, Manuela Schwartz, NP, 40 mg at 05/01/23 0843   metoprolol tartrate (LOPRESSOR) injection 5 mg, 5 mg, Intravenous, Q2H PRN, Hudson, Caralyn, PA-C   mirtazapine (REMERON) tablet 7.5 mg, 7.5 mg, Oral, QHS, Netta Neat, RPH, 7.5 mg at 04/30/23 2025   montelukast (SINGULAIR) tablet 10 mg, 10 mg, Oral, QHS, Esaw Grandchild A, DO, 10 mg at 04/30/23 2146   morphine (PF) 2 MG/ML injection 2 mg, 2 mg, Intravenous, Q2H PRN, Andris Baumann, MD   multivitamin with minerals tablet 1 tablet, 1 tablet, Oral, Daily, Esaw Grandchild A, DO, 1 tablet at 05/01/23 0845   nitroGLYCERIN (NITROSTAT) SL tablet 0.4 mg, 0.4 mg, Sublingual, Q5 min PRN, Andris Baumann, MD   ondansetron [facility]) injection 4 mg, 4 mg, Intravenous, Once, Manuela Schwartz, NP   Oral care mouth rinse, 15 mL, Mouth Rinse, PRN, Raechel Chute, MD   pantoprazole (PROTONIX) injection 40 mg, 40 mg, Intravenous, QHS, Lowella Bandy, RPH, 40 mg at 04/30/23 2145   piperacillin-tazobactam (ZOSYN) IVPB 3.375 g, 3.375 g, Intravenous, Q8H, Wieting, Richard, MD, Last Rate: 12.5 mL/hr at 05/01/23 1200, Infusion Verify at 05/01/23 1200   polyethylene glycol (MIRALAX / GLYCOLAX) packet 17 g, 17 g, Per Tube, Daily, Dgayli, Lianne Bushy, MD, 17 g at 04/30/23 1142   potassium chloride SA (KLOR-CON M) CR tablet 40 mEq, 40 mEq, Oral, Once, Lowella Bandy, RPH   sodium chloride flush (NS) 0.9 % injection 3 mL, 3 mL, Intravenous, Q12H, Paraschos, Alexander, MD, 3 mL at 05/01/23 0851   sodium chloride flush (NS) 0.9 % injection 3 mL, 3 mL,  Intravenous, PRN, Paraschos, Alexander, MD   traZODone (DESYREL) tablet 50 mg, 50 mg, Oral, QHS, Esaw Grandchild A, DO, 50 mg at 04/30/23 2146  Allergies: Allergies  Allergen Reactions   Bextra  [Valdecoxib]    Compazine [Prochlorperazine Edisylate]     Stroke-like symptoms   Lithium Carbonate     Leg weakness   Lyrica [Pregabalin]     Verner Chol, MD

## 2023-05-01 NOTE — Progress Notes (Signed)
 PHARMACY CONSULT NOTE - FOLLOW UP  Pharmacy Consult for Electrolyte Monitoring and Replacement   Recent Labs: Potassium (mmol/L)  Date Value  05/01/2023 3.5  12/26/2012 3.3 (L)   Magnesium (mg/dL)  Date Value  16/10/9602 2.6 (H)  12/25/2012 1.8   Calcium (mg/dL)  Date Value  54/09/8117 8.9   Calcium, Total (mg/dL)  Date Value  14/78/2956 9.1   Albumin (g/dL)  Date Value  21/30/8657 2.2 (L)  03/23/2015 3.6  12/20/2012 3.5   Phosphorus (mg/dL)  Date Value  84/69/6295 2.6   Sodium (mmol/L)  Date Value  05/01/2023 143  01/01/2021 143  12/26/2012 144     Assessment: 78 yo F with PMH including CVA, Afib (on Eliquis) presenting with hypokalemia. Per patient, recently experienced diarrhea for a few days due to taking antibiotic for pneumonia. QTc on ECG this morning was 648. Pharmacy has been consulted to manage electrolytes.  Goal of Therapy:  Potassium 4.0 - 5.1 mmol/L Magnesium 2.0 - 2.4 mg/dL All Other Electrolytes WNL  Plan: ---40 mEq po KCl x 1 ---recheck electrolytes in am  Lowella Bandy, PharmD Clinical Pharmacist 05/01/2023 7:06 AM

## 2023-05-01 NOTE — Progress Notes (Signed)
 [facility] CLINIC CARDIOLOGY PROGRESS NOTE       Patient ID: Beth [name] MRN: [id number] DOB/AGE: 1945-05-11 78 y.o.  Admit date: 04/25/2023 Referring Physician Dr. Renae Gloss Primary Physician Althea Charon, Netta Neat, DO  Primary Cardiologist Dr. Melton Alar Reason for Consultation tachycardia, elevated troponin  HPI: Beth [name] is a 78 y.o. female  with a past medical history of paroxysmal atrial fibrillation on eliquis, hypertension, hyperlipidemia, COPD on home 3L, depression who presented to the ED on 04/25/2023 for suicidal ideation. Has been treated for pneumonia and acute on chronic respiratory failure, decompensated earlier this AM requiring intubation. Was tachycardia and troponins found to be elevated this AM as well. Cardiology was consulted for further evaluation.   Interval history: -Patient seen and examined this morning, resting comfortably. -Respiratory status remains stable on baseline O2. -Maintaining in sinus rhythm since yesterday afternoon.  Heart rate slightly low this a.m. but she is asymptomatic. -BP remains stable.  Review of systems complete and found to be negative unless listed above    Past Medical History:  Diagnosis Date   Allergy    Anxiety    Asthma    Chronic pain syndrome    discharged from pain clinic, hx of narcotics seeking behavior   COPD (chronic obstructive pulmonary disease) (HCC)    CVA (cerebral infarction)    Depression    Fibromyalgia    Headache    Hyperlipidemia    Hypertension    IBS (irritable bowel syndrome)    Stroke (HCC)    Vitamin D deficiency     Past Surgical History:  Procedure Laterality Date   ABDOMINAL HYSTERECTOMY     APPENDECTOMY     BREAST EXCISIONAL BIOPSY  2011   Pt states lump removed, ? side, no scar seen   BREAST SURGERY  2011   biopsy   CERVICAL DISCECTOMY     CHOLECYSTECTOMY     RIGHT HEART CATH N/A 04/30/2023   Procedure: RIGHT HEART CATH;  Surgeon: Marcina Millard, MD;  Location: [facility]  INVASIVE CV LAB;  Service: Cardiovascular;  Laterality: N/A;   SINUSOTOMY      Medications Prior to Admission  Medication Sig Dispense Refill Last Dose/Taking   acetaminophen (TYLENOL) 500 MG tablet Take 500 mg by mouth 3 (three) times daily as needed for mild pain (pain score 1-3).   Taking As Needed   amiodarone (PACERONE) 200 MG tablet Take 1 tablet (200 mg total) by mouth 2 (two) times daily for 12 days, THEN 1 tablet (200 mg total) daily.   04/24/2023 at  8:00 AM   apixaban (ELIQUIS) 5 MG TABS tablet Take 1 tablet (5 mg total) by mouth 2 (two) times daily. 60 tablet 1 04/24/2023 at  5:00 PM   atorvastatin (LIPITOR) 40 MG tablet TAKE 1 TABLET BY MOUTH EVERY DAY (Patient taking differently: Take 40 mg by mouth at bedtime. TAKE 1 TABLET BY MOUTH EVERY DAY) 90 tablet 3 04/24/2023 at  6:00 PM   busPIRone (BUSPAR) 10 MG tablet Take 10 mg by mouth 2 (two) times daily.   04/24/2023 at  4:00 PM   camphor-menthol (SARNA) lotion Apply 1 Application topically 2 (two) times daily.   04/24/2023 at  3:00 PM   FLUoxetine (PROZAC) 10 MG tablet Take 15 mg by mouth daily.   04/24/2023 at  8:00 AM   furosemide (LASIX) 40 MG tablet Take 1 tablet (40 mg total) by mouth daily. If swelling is less, can take only 1 pill.   04/24/2023 at  8:00  AM   gabapentin (NEURONTIN) 100 MG capsule Take 1 capsule (100 mg total) by mouth 3 (three) times daily.   04/24/2023 at  8:00 PM   guaiFENesin (MUCINEX) 600 MG 12 hr tablet Take 600 mg by mouth every 12 (twelve) hours.   04/24/2023 at  8:00 PM   lamoTRIgine (LAMICTAL) 25 MG tablet Take 12.5 mg by mouth daily.   04/24/2023 at  8:00 AM   levothyroxine (SYNTHROID) 25 MCG tablet Take 1 tablet (25 mcg total) by mouth daily at 6 (six) AM. (Patient taking differently: Take 12.5 mcg by mouth daily at 6 (six) AM.)   04/24/2023 at  6:00 AM   LORazepam (ATIVAN) 0.5 MG tablet Take 0.5 mg by mouth every 8 (eight) hours as needed for anxiety, sleep, seizure or sedation.   Taking As Needed   melatonin 5  MG TABS Take 1 tablet (5 mg total) by mouth at bedtime.   04/24/2023 at  8:00 PM   Menthol-Zinc Oxide (CALMOSEPTINE) 0.44-20.6 % OINT Apply 1 application  topically as directed. Apply to area at every shift change   04/24/2023 Evening   metoprolol tartrate (LOPRESSOR) 25 MG tablet Take 0.5 tablets (12.5 mg total) by mouth 2 (two) times daily.   04/24/2023 at  8:00 PM   mirtazapine (REMERON) 15 MG tablet Take 15 mg by mouth at bedtime.   04/24/2023 at  8:00 PM   montelukast (SINGULAIR) 10 MG tablet TAKE 1 TABLET(10 MG) BY MOUTH AT BEDTIME 90 tablet 1 04/24/2023 at  8:00 PM   NYAMYC powder Apply 1 Application topically 2 (two) times daily.   04/24/2023 at  4:00 PM   polyethylene glycol (MIRALAX / GLYCOLAX) 17 g packet Take 17 g by mouth daily as needed for mild constipation.   Taking As Needed   traMADol (ULTRAM) 50 MG tablet Take 50 mg by mouth 3 (three) times daily as needed for moderate pain (pain score 4-6).   Taking As Needed   traZODone (DESYREL) 50 MG tablet Take 1 tablet (50 mg total) by mouth at bedtime.   04/24/2023 at  8:00 PM   TRELEGY ELLIPTA 200-62.5-25 MCG/ACT AEPB INHALE 1 PUFF INTO THE LUNGS DAILY 60 each 2 04/24/2023 at  8:00 AM   Social History   Socioeconomic History   Marital status: Legally Separated    Spouse name: Not on file   Number of children: 2   Years of education: Not on file   Highest education level: Not on file  Occupational History   Occupation: retired  Tobacco Use   Smoking status: Some Days    Current packs/day: 0.00    Average packs/day: 0.3 packs/day for 57.0 years (14.3 ttl pk-yrs)    Types: Cigarettes    Start date: 10/25/1963    Last attempt to quit: 10/24/2020    Years since quitting: 2.5   Smokeless tobacco: Never   Tobacco comments:    16 cigarettes a week. Khj 07/26/2022  Vaping Use   Vaping status: Never Used  Substance and Sexual Activity   Alcohol use: No    Alcohol/week: 0.0 standard drinks of alcohol   Drug use: No   Sexual activity: Not  Currently  Other Topics Concern   Not on file  Social History Narrative   Not on file   Social Drivers of Health   Financial Resource Strain: Low Risk  (02/01/2022)   Overall Financial Resource Strain (CARDIA)    Difficulty of Paying Living Expenses: Not hard at all  Food Insecurity:  No Food Insecurity (04/25/2023)   Hunger Vital Sign    Worried About Running Out of Food in the Last Year: Never true    Ran Out of Food in the Last Year: Never true  Transportation Needs: Unmet Transportation Needs (04/25/2023)   PRAPARE - Transportation    Lack of Transportation (Medical): Yes    Lack of Transportation (Non-Medical): Yes  Physical Activity: Insufficiently Active (02/01/2022)   Exercise Vital Sign    Days of Exercise per Week: 7 days    Minutes of Exercise per Session: 20 min  Stress: No Stress Concern Present (02/01/2022)   [employer] of Occupational Health - Occupational Stress Questionnaire    Feeling of Stress : Only a little  Social Connections: Socially Isolated (04/25/2023)   Social Connection and Isolation Panel [NHANES]    Frequency of Communication with Friends and Family: Twice a week    Frequency of Social Gatherings with Friends and Family: Once a week    Attends Religious Services: Never    Database administrator or Organizations: No    Attends Banker Meetings: Never    Marital Status: Separated  Intimate Partner Violence: Not At Risk (04/25/2023)   Humiliation, Afraid, Rape, and Kick questionnaire    Fear of Current or Ex-Partner: No    Emotionally Abused: No    Physically Abused: No    Sexually Abused: No    Family History  Problem Relation Age of Onset   Anxiety disorder Mother    Depression Mother    Breast cancer Mother 40   Cancer Father    Gallbladder disease Father    Alcohol abuse Father    Depression Father    Bipolar disorder Son      Vitals:   05/01/23 0200 05/01/23 0400 05/01/23 0500 05/01/23 0600  BP: 127/65 124/60  (!) 112/53   Pulse: (!) 54 (!) 51  (!) 45  Resp: 19 19  16   Temp:  98.1 F (36.7 C)    TempSrc:  Oral    SpO2: 98% 98%  95%  Weight:   57.8 kg   Height:        PHYSICAL EXAM General: Chronically ill appearing elderly female, intubated and sedated. HEENT: Normocephalic and atraumatic. Neck: No JVD.  Lungs: Normal respiratory effort on 3L [place].  Heart: Irregularly irregular, fast rate. Normal S1 and S2 without gallops or murmurs.  Abdomen: Non-distended appearing.  Msk: Normal strength and tone for age. Extremities: Warm and well perfused. No clubbing, cyanosis. No edema.  Neuro: Alert and oriented X 3. Psych: Answers questions appropriately.   Labs: Basic Metabolic Panel: Recent Labs    04/30/23 0321 04/30/23 0322 04/30/23 0936 05/01/23 0531  NA  --  140 142 143  K  --  4.0 3.7 3.5  CL  --  104  --  106  CO2  --  28  --  30  GLUCOSE  --  134*  --  175*  BUN  --  18  --  21  CREATININE  --  0.88  --  0.65  CALCIUM  --  8.7*  --  8.9  MG 2.2  --   --  2.6*  PHOS  --  3.9  --  2.6   Liver Function Tests: Recent Labs    04/30/23 0322 05/01/23 0531  ALBUMIN 2.1* 2.2*   No results for input(s): "LIPASE", "AMYLASE" in the last 72 hours. CBC: Recent Labs    04/30/23 0321 04/30/23 1914  05/01/23 0531  WBC 10.8*  --  10.4  HGB 8.6* 9.2* 8.5*  HCT 26.8* 27.0* 26.4*  MCV 92.4  --  92.3  PLT 165  --  165   Cardiac Enzymes: Recent Labs    04/28/23 2214 04/29/23 0808  TROPONINIHS 664* 340*   BNP: Recent Labs    04/28/23 1004  BNP 1,612.0*   D-Dimer: No results for input(s): "DDIMER" in the last 72 hours. Hemoglobin A1C: No results for input(s): "HGBA1C" in the last 72 hours.  Fasting Lipid Panel: Recent Labs    04/29/23 0426  TRIG 105   Thyroid Function Tests: No results for input(s): "TSH", "T4TOTAL", "T3FREE", "THYROIDAB" in the last 72 hours.  Invalid input(s): "FREET3" Anemia Panel: No results for input(s): "VITAMINB12", "FOLATE", "FERRITIN", "TIBC",  "IRON", "RETICCTPCT" in the last 72 hours.   Radiology: CARDIAC CATHETERIZATION Result Date: 04/30/2023   Hemodynamic findings consistent with moderate pulmonary hypertension. 1.  Moderate pulmonary hypertension, PA mean 42 mmHg 2.  PCWP 28 mmHg 3.  Fick cardiac output 4.57 L/min   DG Chest Port 1 View Result Date: 04/29/2023 CLINICAL DATA:  78 year old female with respiratory failure. Intubated. EXAM: PORTABLE CHEST 1 VIEW COMPARISON:  Portable chest yesterday and earlier. FINDINGS: Portable AP view at 0511 hours. Mildly more rotated to the right. Endotracheal tube tip in good position between the clavicles and carina. Visible enteric tube loops in the stomach. Mediastinal contours remain within normal limits. Lung volumes slightly larger. Coarse bilateral pulmonary interstitial opacity with vague, confluent lung base opacity continues. But mildly improved bilateral ventilation since yesterday. No pneumothorax. No areas of worsening ventilation. Stable visualized osseous structures. Paucity of bowel gas. IMPRESSION: 1. Satisfactory endotracheal and enteric tubes. 2. Mildly improved bilateral lung volumes and ventilation since yesterday. Ongoing widespread coarse interstitial and more confluent bilateral lung base opacity. Electronically Signed   By: Odessa Fleming M.D.   On: 04/29/2023 07:59   ECHOCARDIOGRAM COMPLETE Result Date: 04/28/2023    ECHOCARDIOGRAM REPORT   Patient Name:   Beth [name] Date of Exam: 04/28/2023 Medical Rec #:  [id number]        Height:       65.0 in Accession #:    [id number]       Weight:       120.0 lb Date of Birth:  Oct 21, 1945        BSA:          1.592 m Patient Age:    77 years         BP:           132/64 mmHg Patient Gender: F                HR:           65 bpm. Exam Location:  [facility] Procedure: 2D Echo, Cardiac Doppler and Color Doppler (Both Spectral and Color            Flow Doppler were utilized during procedure). Indications:     Elevated Troponin  History:         Patient  has prior history of Echocardiogram examinations, most                  recent 09/09/2022. COPD and Stroke, Arrythmias:Atrial                  Fibrillation, Signs/Symptoms:Dyspnea; Risk                  Factors:Hypertension, Dyslipidemia and  Current Smoker. CKD.  Sonographer:     Mikki Harbor Referring Phys:  [id number] Andris Baumann Diagnosing Phys: Yvonne Kendall MD  Sonographer Comments: Echo performed with patient supine and on artificial respirator. Image acquisition challenging due to respiratory motion. IMPRESSIONS  1. Left ventricular ejection fraction, by estimation, is 55 to 60%. The left ventricle has normal function. The left ventricle has no regional wall motion abnormalities. Left ventricular diastolic parameters were normal.  2. Right ventricular systolic function is mildly reduced. The right ventricular size is normal. Mildly increased right ventricular wall thickness. There is severely elevated pulmonary artery systolic pressure.  3. Left atrial size was moderately dilated.  4. Moderate pleural effusion in the left lateral region.  5. The mitral valve is abnormal. Moderate to severe mitral valve regurgitation.  6. Tricuspid valve regurgitation is moderate to severe.  7. The aortic valve is tricuspid. There is mild calcification of the aortic valve. There is mild thickening of the aortic valve. Aortic valve regurgitation is mild. Aortic valve sclerosis/calcification is present, without any evidence of aortic stenosis.  8. The inferior vena cava is dilated in size with <50% respiratory variability, suggesting right atrial pressure of 15 mmHg. FINDINGS  Left Ventricle: Left ventricular ejection fraction, by estimation, is 55 to 60%. The left ventricle has normal function. The left ventricle has no regional wall motion abnormalities. The left ventricular internal cavity size was normal in size. There is  borderline left ventricular hypertrophy. Left ventricular diastolic parameters were normal. Right  Ventricle: The right ventricular size is normal. Mildly increased right ventricular wall thickness. Right ventricular systolic function is mildly reduced. There is severely elevated pulmonary artery systolic pressure. The tricuspid regurgitant velocity is 3.64 m/s, and with an assumed right atrial pressure of 15 mmHg, the estimated right ventricular systolic pressure is 68.0 mmHg. Left Atrium: Left atrial size was moderately dilated. Right Atrium: Right atrial size was normal in size. Pericardium: Trivial pericardial effusion is present. Mitral Valve: The mitral valve is abnormal. Mild to moderate mitral annular calcification. Moderate to severe mitral valve regurgitation. MV peak gradient, 7.1 mmHg. The mean mitral valve gradient is 2.0 mmHg. Tricuspid Valve: The tricuspid valve is normal in structure. Tricuspid valve regurgitation is moderate to severe. Aortic Valve: The aortic valve is tricuspid. There is mild calcification of the aortic valve. There is mild thickening of the aortic valve. Aortic valve regurgitation is mild. Aortic valve sclerosis/calcification is present, without any evidence of aortic stenosis. Aortic valve mean gradient measures 4.0 mmHg. Aortic valve peak gradient measures 8.0 mmHg. Aortic valve area, by VTI measures 2.04 cm. Pulmonic Valve: The pulmonic valve was not well visualized. Pulmonic valve regurgitation is not visualized. No evidence of pulmonic stenosis. Aorta: The aortic root and ascending aorta are structurally normal, with no evidence of dilitation. Venous: The inferior vena cava is dilated in size with less than 50% respiratory variability, suggesting right atrial pressure of 15 mmHg. IAS/Shunts: No atrial level shunt detected by color flow Doppler. Additional Comments: There is a moderate pleural effusion in the left lateral region.  LEFT VENTRICLE PLAX 2D LVIDd:         4.70 cm     Diastology LVIDs:         3.30 cm     LV e' medial:    8.59 cm/s LV PW:         1.13 cm     LV  E/e' medial:  13.5 LV IVS:  0.80 cm     LV e' lateral:   13.30 cm/s LVOT diam:     1.90 cm     LV E/e' lateral: 8.7 LV SV:         67 LV SV Index:   42 LVOT Area:     2.84 cm  LV Volumes (MOD) LV vol d, MOD A2C: 53.5 ml LV vol d, MOD A4C: 57.5 ml LV vol s, MOD A2C: 20.6 ml LV vol s, MOD A4C: 25.1 ml LV SV MOD A2C:     32.9 ml LV SV MOD A4C:     57.5 ml LV SV MOD BP:      31.8 ml RIGHT VENTRICLE RV Basal diam:  3.00 cm RV Mid diam:    2.30 cm LEFT ATRIUM             Index        RIGHT ATRIUM           Index LA diam:        4.20 cm 2.64 cm/m   RA Area:     14.70 cm LA Vol (A2C):   81.4 ml 51.13 ml/m  RA Volume:   35.50 ml  22.30 ml/m LA Vol (A4C):   76.5 ml 48.05 ml/m LA Biplane Vol: 79.6 ml 50.00 ml/m  AORTIC VALVE                    PULMONIC VALVE AV Area (Vmax):    2.21 cm     PV Vmax:       0.72 m/s AV Area (Vmean):   2.11 cm     PV Peak grad:  2.1 mmHg AV Area (VTI):     2.04 cm AV Vmax:           141.00 cm/s AV Vmean:          91.550 cm/s AV VTI:            0.328 m AV Peak Grad:      8.0 mmHg AV Mean Grad:      4.0 mmHg LVOT Vmax:         110.00 cm/s LVOT Vmean:        68.000 cm/s LVOT VTI:          0.236 m LVOT/AV VTI ratio: 0.72  AORTA Ao Root diam: 3.30 cm Ao Asc diam:  3.20 cm MITRAL VALVE                  TRICUSPID VALVE MV Area (PHT): 5.54 cm       TR Peak grad:   53.0 mmHg MV Area VTI:   1.91 cm       TR Vmax:        364.00 cm/s MV Peak grad:  7.1 mmHg MV Mean grad:  2.0 mmHg       SHUNTS MV Vmax:       1.33 m/s       Systemic VTI:  0.24 m MV Vmean:      56.9 cm/s      Systemic Diam: 1.90 cm MV Decel Time: 137 msec MR Peak grad:    145.9 mmHg MR Mean grad:    91.0 mmHg MR Vmax:         604.00 cm/s MR Vmean:        449.0 cm/s MR PISA:         2.26 cm MR PISA Eff ROA: 35 mm MR PISA Radius:  0.60 cm MV E velocity: 116.00 cm/s MV A velocity: 69.70 cm/s MV E/A ratio:  1.66 Yvonne Kendall MD Electronically signed by Yvonne Kendall MD Signature Date/Time: 04/28/2023/7:14:48 PM    Final     DG Chest Port 1 View Result Date: 04/28/2023 CLINICAL DATA:  Intubation EXAM: PORTABLE CHEST 1 VIEW COMPARISON:  Two days ago FINDINGS: Diffuse interstitial opacity with patchy airspace density greatest at the right base, progressed at the right base. There is small volume pleural fluid likely. No pneumothorax. Endotracheal tube with tip between the clavicular heads and carina. An enteric tube reaches the stomach at least. Normal heart size IMPRESSION: Worsening infiltrate especially at the right lung base. New hardware in unremarkable position. Electronically Signed   By: Tiburcio Pea M.D.   On: 04/28/2023 10:26   DG Abd 1 View Result Date: 04/28/2023 CLINICAL DATA:  NG placement. EXAM: ABDOMEN - 1 VIEW COMPARISON:  None Available. FINDINGS: Enteric tube with tip in the region of the distal stomach. No bowel dilatation noted. Small bilateral pleural effusions and diffuse interstitial and bibasilar densities. IMPRESSION: Enteric tube with tip in the distal stomach. Electronically Signed   By: Elgie Collard M.D.   On: 04/28/2023 10:23   DG Chest Port 1 View Result Date: 04/26/2023 CLINICAL DATA:  Respiratory failure EXAM: PORTABLE CHEST 1 VIEW COMPARISON:  04/26/2023 at 9:13 a.m. FINDINGS: Increased predominantly interstitial opacities bilaterally greatest in the left mid/upper lung. No pleural effusion or pneumothorax. Stable cardiomediastinal silhouette. Aortic atherosclerotic calcification. IMPRESSION: Increased predominantly interstitial opacities bilaterally greatest in the left mid/upper lung. Findings favor pneumonia superimposed on a background emphysema. Electronically Signed   By: Minerva Fester M.D.   On: 04/26/2023 23:49   DG Chest Port 1 View Result Date: 04/26/2023 CLINICAL DATA:  Cough. Recent antibiotics for pneumonia with resulting diarrhea. History of CVA and atrial fibrillation. EXAM: PORTABLE CHEST 1 VIEW COMPARISON:  None recent. Chest CT 09/13/2022. Radiographs 09/13/2022  and 08/25/2022. FINDINGS: 0913 hours. The heart size and mediastinal contours are stable with mild aortic atherosclerosis. The aeration of the lungs has improved compared with the available prior studies. There are patchy pulmonary opacities bilaterally which could reflect postinflammatory scarring or recurrent infection. There is no consolidation, significant pleural effusion or pneumothorax. The bones appear unchanged status post lower cervical fusion. IMPRESSION: Overall improvement in bilateral airspace opacities compared with prior studies from August. Patchy pulmonary opacities bilaterally which could reflect postinflammatory scarring or recurrent infection. No consolidation or pleural effusion. Electronically Signed   By: Carey Bullocks M.D.   On: 04/26/2023 13:05    ECHO as above  TELEMETRY reviewed by me 05/01/2023: Sinus rhythm rate 40s  EKG reviewed by me: VT 180 bpm  Data reviewed by me 05/01/2023: last 24h vitals tele labs imaging I/O hospitalist progress notes, PCCM notes  Principal Problem:   Acute on chronic respiratory failure with hypoxia and hypercapnia (HCC) Active Problems:   Pulmonary hypertension (HCC)   Chronic diastolic congestive heart failure (HCC)   Depression with suicidal ideation   Asthma with COPD (HCC)   Hypokalemia   Prolonged QT interval   UTI (urinary tract infection)   Atrial fibrillation with slow ventricular response (HCC)   Other specified hypothyroidism   Hypomagnesemia   Suicidal ideation   Diarrhea   Hypophosphatemia   Septic shock (HCC)   Metabolic acidosis   Multifocal pneumonia   Precordial chest pain   Respiratory distress   Protein-calorie malnutrition, severe   Decompensated heart failure (HCC)  ASSESSMENT AND PLAN:  Beth [name] is a 78 y.o. female  with a past medical history of paroxysmal atrial fibrillation on eliquis, hypertension, hyperlipidemia, COPD on home 3L, depression who presented to the ED on 04/25/2023 for suicidal  ideation. Has been treated for pneumonia and acute on chronic respiratory failure, decompensated earlier this AM requiring intubation. Was tachycardia and troponins found to be elevated this AM as well. Cardiology was consulted for further evaluation.   # Acute on Chronic respiratory failure # Pneumonia # Ventricular Tachycardia # Demand Ischemia   # Paroxysmal Atrial fibrillation  Patient's respiratory status decompensated this morning that required intubation and had VT in 170-180s. Patient received 2x bolus of 150 mg Amio and IV 2g Mg, converted to NSR rate in 70s. Patient is hemodynamically stable. Trops elevated and flat 481 > 449 likely due to demand ischemia. Today labs showed elevated BNP 1612.  Echo with preserved EF, no wall motion abnormalities, mildly reduced RV function, severely elevated PASP, moderate to severe MR and TR, moderate pleural effusion.  RHC yesterday with moderate pulmonary hypertension, PA mean 42, wedge 28. -Continue IV amiodarone, will start p.o. today and IV will stop this evening.   -Will give another dose of IV Lasix today. -Continue Eliquis for stroke risk reduction  -Continue atorvastatin 40 mg per tube -Discontinue metoprolol. -Further management of respiratory failure per hospitalist service.   This patient's plan of care was discussed and created with Dr. Darrold Junker and he is in agreement.  Signed: Gale Journey, PA-C  05/01/2023, 9:01 AM [facility]

## 2023-05-01 NOTE — Plan of Care (Addendum)
 PMT consulted for goals of care. Sitter at bedside. Per notes patient is alert and oriented and is IVC'd due to attempting to stab herself in the leg and stating "I didn't want to live any more." PMT is unable to complete goals of care discussion with this patient as patient would need to be allowed to make decisions and feel free to express her honest feelings. Please consider reconsulting PMT when patient is no longer IVCd and feels free/safe to discuss potential wishes of desiring hospice level care without fear of undesired consequences.

## 2023-05-01 NOTE — Discharge Instructions (Signed)
 Transportation Resources  Agency Name: [facility] Agency Address: [address] [place], [place] [address] Phone: [phone] Email: [email or web address] Website: [email or web address] Service(s) Offered: Housing services, self-sufficiency, congregate meal program, weatherization program, Field seismologist program, emergency food assistance,  housing counseling, home ownership program, wheels-towork program.  Agency Name: [facility] [employer] [phone]) Address: [address] [place], [place] [address] Phone: [phone] Website: [email or web address] Service(s) Offered: Transportation for [employer], subscription and demand response; Dial-a-Ride for citizens 70 years of age or older.  Agency Name: Department [facility] Social Services Address: [address] [place], [place] [address] Phone: [phone] Service(s) Offered: Child support services; child welfare services; food stamps; Medicaid; work first family assistance; and aid with fuel,  rent, food and medicine, transportation assistance.  Agency Name: Disabled [employer] (DAV) Transportation  Network Phone: [phone] Service(s) Offered: Transports veterans to the [facility] medical center. Call  forty-eight hours in advance and leave the name, telephone  number, date, and time of appointment. Veteran will be  contacted by the driver the day before the appointment to  arrange a pick up point   Transportation Resources  Agency Name: [facility] Agency Address: [address] [place], [place] [address] Phone: [phone] Email: [email or web address] Website: [email or web address] Service(s) Offered: Housing services, self-sufficiency, congregate meal program, weatherization program, Field seismologist program, emergency food assistance,  housing counseling, home ownership program, wheels-towork  program.  Agency Name: [facility] [employer] [phone]) Address: [address] [place], [place] [address] Phone: [phone] Website: [email or web address] Service(s) Offered: Transportation for [employer], subscription and demand response; Dial-a-Ride for citizens 70 years of age or older.  Agency Name: Department of Social Services Address: [address] [place], [place] [address] Phone: [phone] Service(s) Offered: Child support services; child welfare services; food stamps; Medicaid; work first family assistance; and aid with fuel,  rent, food and medicine, transportation assistance.  Agency Name: Disabled [employer] (DAV) Transportation  Network Phone: [phone] Service(s) Offered: Transports veterans to the [facility] medical center. Call  forty-eight hours in advance and leave the name, telephone  number, date, and time of appointment. Veteran will be  contacted by the driver the day before the appointment to  arrange a pick up point    [employer] currently provides door to door services. ACTA connects with PART daily for services to [facility]. ACTA also performs contract services to [employer] operates 27 vehicles, all but 3 mini-vans are equipped with lifts for special needs as well as the general public. ACTA drivers are each CDL certified and trained in First Aid and CPR. ACTA was established in 2002 by [employer]. An independent Industrial/product designer. ACTA operates via Cytogeneticist with required Research scientist (physical sciences) from [place]. ACTA provides over 80,000 passenger trips each year, including [facility] and [employer] sites.  Call at least by 11 AM one business day prior to needing transportation  [employer].                      [place], [place] [address]     Office  Hours: Monday-Friday  8 AM - 5 PM   the [facility] offers a [employer] that anyone can call toll free at [phone]. The friendship line is available 24 hours a day  [employer] is a Program of All-inclusive Care for the Elderly (  PACE). Their mission is to promote and sustain the independence of seniors wishing to remain in the community. They provide seniors with comprehensive long-term health, social, medical and dietary care. Their program is a safe alternative to nursing home care. [phone]  [facility] Physical Address [facility] [address] [place], [place] [address] Phone: [phone]. . Online zoom yoga class, connect with others without leaving your home [facility] offers [place] dance cardio sessions for individuals via Zoom. This program provides: - Dance fitness activities Please contact program for more information. Servinganyone in need adults 18+ hiv/aids individuals families Call [phone]  Email [email or web address] to get more info  [employer] offers an online [employer] to individuals where they can receive help to focus on their best health. Whether you're a [employer] member or not, the neighborhood center offers a... Main Serviceshealth education  exercise & fitness  community support services  recreation  virtual support Other Servicessupport groups Servinganyone in need adults young adults teens seniors individuals families [email or web address] to get more info  Schedule on their website  The [facility] offers an array of activities for adults age 34 and over. This program provides:- Fitness and health programs- Tech classes- Activity books Main Serviceshealth education  community support services  exercise & fitness  recreation  more education Servingseniors  Call [phone]    For more resources go online to [email or web address] and type in you  zipcode

## 2023-05-01 NOTE — Plan of Care (Signed)
  Problem: Health Behavior/Discharge Planning: Goal: Ability to manage health-related needs will improve Outcome: Progressing   Problem: Clinical Measurements: Goal: Ability to maintain clinical measurements within normal limits will improve Outcome: Progressing Goal: Will remain free from infection Outcome: Progressing Goal: Respiratory complications will improve Outcome: Progressing   Problem: Activity: Goal: Risk for activity intolerance will decrease Outcome: Progressing   Problem: Nutrition: Goal: Adequate nutrition will be maintained Outcome: Not Progressing   Problem: Coping: Goal: Level of anxiety will decrease Outcome: Not Progressing   Problem: Elimination: Goal: Will not experience complications related to bowel motility Outcome: Progressing   Problem: Pain Managment: Goal: General experience of comfort will improve and/or be controlled Outcome: Progressing   Problem: Safety: Goal: Ability to remain free from injury will improve Outcome: Progressing   Problem: Tissue Perfusion: Goal: Adequacy of tissue perfusion will improve Outcome: Progressing   Problem: Role Relationship: Goal: Method of communication will improve Outcome: Progressing
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
## 2023-05-01 NOTE — TOC Progression Note (Signed)
 Transition of Care [facility] - Progression Note    Patient Details  Name: Beth [name] MRN: [id number] Date of Birth: 27-Oct-1945  Transition of [facility]) CM/SW Contact  Margarito Liner, LCSW Phone Number: 05/01/2023, 11:45 AM  Clinical Narrative: TOC continues to follow progress.    Expected Discharge Plan: Skilled Nursing Facility Barriers to Discharge: Continued Medical Work up  Expected Discharge Plan and Services       Living arrangements for the past 2 months: Skilled Nursing Facility                                       Social Determinants of Health (SDOH) Interventions SDOH Screenings   Food Insecurity: No Food Insecurity (04/25/2023)  Housing: Low Risk  (04/25/2023)  Transportation Needs: Unmet Transportation Needs (04/25/2023)  Utilities: Not At Risk (04/25/2023)  Alcohol Screen: Low Risk  (02/01/2022)  Depression (PHQ2-9): High Risk (05/13/2022)  Financial Resource Strain: Low Risk  (02/01/2022)  Physical Activity: Insufficiently Active (02/01/2022)  Social Connections: Socially Isolated (04/25/2023)  Stress: No Stress Concern Present (02/01/2022)  Tobacco Use: High Risk (04/24/2023)  Health Literacy: Adequate Health Literacy (09/16/2022)    Readmission Risk Interventions    04/26/2023    4:30 PM 09/09/2022   12:06 PM  Readmission Risk Prevention Plan  Transportation Screening Complete Complete  PCP or Specialist Appt within 3-5 Days Complete   HRI or Home Care Consult Complete   Social Work Consult for Recovery Care Planning/Counseling Complete   Palliative Care Screening Not Applicable   Medication Review Oceanographer) Complete Referral to Pharmacy  PCP or Specialist appointment within 3-5 days of discharge  Complete  HRI or Home Care Consult  Complete  SW Recovery Care/Counseling Consult  Complete  Palliative Care Screening  Complete  Skilled Nursing Facility  Complete
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
## 2023-05-02 DIAGNOSIS — F329 Major depressive disorder, single episode, unspecified: Secondary | ICD-10-CM | POA: Diagnosis not present

## 2023-05-02 DIAGNOSIS — N3000 Acute cystitis without hematuria: Secondary | ICD-10-CM | POA: Diagnosis not present

## 2023-05-02 DIAGNOSIS — Z515 Encounter for palliative care: Secondary | ICD-10-CM

## 2023-05-02 DIAGNOSIS — E876 Hypokalemia: Secondary | ICD-10-CM | POA: Diagnosis not present

## 2023-05-02 DIAGNOSIS — Z7189 Other specified counseling: Secondary | ICD-10-CM

## 2023-05-02 DIAGNOSIS — F411 Generalized anxiety disorder: Secondary | ICD-10-CM | POA: Diagnosis not present

## 2023-05-02 DIAGNOSIS — J9622 Acute and chronic respiratory failure with hypercapnia: Secondary | ICD-10-CM | POA: Diagnosis not present

## 2023-05-02 DIAGNOSIS — J9621 Acute and chronic respiratory failure with hypoxia: Secondary | ICD-10-CM | POA: Diagnosis not present

## 2023-05-02 LAB — RENAL FUNCTION PANEL
Albumin: 2.3 g/dL — ABNORMAL LOW (ref 3.5–5.0)
Anion gap: 7 (ref 5–15)
BUN: 23 mg/dL (ref 8–23)
CO2: 29 mmol/L (ref 22–32)
Calcium: 8.7 mg/dL — ABNORMAL LOW (ref 8.9–10.3)
Chloride: 106 mmol/L (ref 98–111)
Creatinine, Ser: 0.87 mg/dL (ref 0.44–1.00)
GFR, Estimated: >60 mL/min (ref >60)
Glucose, Bld: 143 mg/dL — ABNORMAL HIGH (ref 70–99)
Phosphorus: 2.9 mg/dL (ref 2.5–4.6)
Potassium: 4.2 mmol/L (ref 3.5–5.1)
Sodium: 142 mmol/L (ref 135–145)

## 2023-05-02 LAB — CBC
HCT: 27.9 % — ABNORMAL LOW (ref 36.0–46.0)
Hemoglobin: 9.1 g/dL — ABNORMAL LOW (ref 12.0–15.0)
MCH: 30.3 pg (ref 26.0–34.0)
MCHC: 32.6 g/dL (ref 30.0–36.0)
MCV: 93 fL (ref 80.0–100.0)
Platelets: 163 10*3/uL (ref 150–400)
RBC: 3 MIL/uL — ABNORMAL LOW (ref 3.87–5.11)
RDW: 16.8 % — ABNORMAL HIGH (ref 11.5–15.5)
WBC: 9 10*3/uL (ref 4.0–10.5)
nRBC: 0 % (ref 0.0–0.2)

## 2023-05-02 LAB — LIPOPROTEIN A (LPA): Lipoprotein (a): 85.7 nmol/L — ABNORMAL HIGH (ref <75.0)

## 2023-05-02 LAB — GLUCOSE, CAPILLARY
Glucose-Capillary: 144 mg/dL — ABNORMAL HIGH (ref 70–99)
Glucose-Capillary: 128 mg/dL — ABNORMAL HIGH (ref 70–99)
Glucose-Capillary: 149 mg/dL — ABNORMAL HIGH (ref 70–99)
Glucose-Capillary: 128 mg/dL — ABNORMAL HIGH (ref 70–99)
Glucose-Capillary: 141 mg/dL — ABNORMAL HIGH (ref 70–99)

## 2023-05-02 LAB — MAGNESIUM: Magnesium: 2.1 mg/dL (ref 1.7–2.4)

## 2023-05-02 MED ORDER — PREDNISONE 20 MG PO TABS
40.0000 mg | ORAL_TABLET | Freq: Every day | ORAL | Status: DC
  Administered 2023-05-03 – 2023-05-04 (×2): 40 mg via ORAL
  Filled 2023-05-02 (×2): qty 2

## 2023-05-02 MED ORDER — POLYETHYLENE GLYCOL 3350 17 G PO PACK
17.0000 g | PACK | Freq: Every day | ORAL | Status: DC
  Filled 2023-05-02 (×3): qty 1

## 2023-05-02 MED ORDER — METOPROLOL TARTRATE 25 MG PO TABS
25.0000 mg | ORAL_TABLET | Freq: Two times a day (BID) | ORAL | Status: DC
  Administered 2023-05-02 – 2023-05-04 (×5): 25 mg via ORAL
  Filled 2023-05-02 (×7): qty 1

## 2023-05-02 MED ORDER — FUROSEMIDE 40 MG PO TABS
40.0000 mg | ORAL_TABLET | Freq: Every day | ORAL | Status: DC
  Administered 2023-05-02 – 2023-05-03 (×2): 40 mg via ORAL
  Filled 2023-05-02 (×2): qty 1
  Filled 2023-05-02: qty 2

## 2023-05-02 NOTE — Progress Notes (Addendum)
 Progress Note   Patient: Beth [name] ZOX:[id number] DOB: [birth year] DOA: 04/25/2023     7 DOS: the patient was seen and examined on 05/02/2023   Brief hospital course: 78 y.o. female with medical history significant for CVA with left-sided residual weakness, depression, anxiety, neuropathy, insomnia, atrial fibrillation on Eliquis, COPD on home O2 at 3 L who was brought to the ED after she tried to stab herself with scissors.  She endorses feeling suicidal.  Patient recently recovered from a bout of pneumonia, completing antibiotic treatment.  She did endorse diarrhea in the few days prior.  She has had very poor oral intake. ED course and data review: BP slightly elevated on arrival at 170/72, pulse in the 50s otherwise normal vitals Labs notable for potassium of 2.2 and magnesium 1.6.  TSH slightly elevated at 5.528.  Free T4 also elevated at 1.66 Urinalysis with moderate leukocytes CBC WNL, BMP except for potassium WNL. UDS, salicylate, EtOH and acetaminophen levels within normal parameters EKG, personally reviewed and interpreted showing sinus bradycardia at 51 and prolonged QT to 648   Patient was started on ceftriaxone for possibility of UTI.  Also given oral and IV potassium repletion.   Seen by behavioral health,    Hospitalist consulted for admission.   3/28.  Potassium still low this morning IV and oral potassium ordered.  3/29.  Psychiatry changed her medications over to Remeron at night secondary to prolonged QT.  Electrolytes much improved.  Patient felt short of breath this morning.  Chest x-ray showing overall improvement in bilateral airspace opacities from prior studies.  Patient had worsening shortness of breath and was acidotic and sent to the ICU for BiPAP and started on aggressive antibiotics and steroids.  3/30.  Patient did not like the BiPAP mask.  Placed back on nasal cannula.  On IV fluids.  Continue Zosyn.  Discontinue vancomycin with MRSA PCR being negative.   Continue Solu-Medrol.  3/31 -- pt acutely decompensated with flash pulmonary edema, in addition to SVT/VT followed by A-fib with RVR.  Pt had to emergently intubated to transferred to ICU.  In ICU, Cardiology was consulted.  Patient was aggressively diuresed and placed on IV amiodarone. Tested + for non-Covid coronoavirus OC43. Extubated 4/1.  4/2 -- TRH resumed care, pt extubated yesterday.   RHC cath today, see report and Cardiology recommendations.   Assessment and Plan:  Acute on chronic respiratory failure with hypoxia and hypercapnia  Required BiPAP >> emergent intubation on 3/31, extubated 4/1 after aggressive diuresis for flash pulmonary edema --Mgmt of underlying conditions as below --O2 per protocol, wean as tolerated --BiPAP PRN --Pulmonary hygiene   Septic shock - resolved off pressors Multifocal pneumonia, tachypnea, leukocytosis and elevated lactic acid.  Given IV fluids per sepsis on admission. COPD with acute exacerbation Hx of Asthma/COPD --Continue IV antibiotics - Zosyn to complete 7 days --IV steroids -- transition to prednisone tomorrow --Bronchodilators --Mucolytics and other supportive care per orders   Flash Pulmonary Edema - required intubation on 3/31, aggressively diuresed, extubated 4/1. Acute on Chronic diastolic congestive heart failure  SVT  / V Tach A-Fib with RVR Demand Ischemia Hx of Paroxysmal A-fib --Cardiology following - see ercs --Off amio drip 4/3 --Amiodarone 200 mg PO BID x 10 days, then 200 mg daily --Eliquis for stroke risk reduction --Lipitor --Metoprolol was stopped --Daily weights & strict Io's --IV diuresis has been stopped, per cardiology --Monitor renal function, electrolytes  --RHC cath 4/2 - see report    Atrial fibrillation  with slow ventricular response (HCC) --Management per Cardiology --Continue Eliquis --Metoprolol was stopped, now resumed --Transitioned amiodarone drip >> PO yesterday, 200 mg BID x 10 days,  then 200 mg daily --Telemetry --Maintain K>4, Mg>2    Suicidal ideations - POA Depression and Anxiety Psychiatry consulted 3/28 - recommended inpatient psych admission when medically cleared.  IVC was only recommended in the event pt attempted to leave the hospital, but appears patient is still under IVC. --One-to-one observation.   --Psychiatry to re-assess this afternoon --On Buspar 10 mg BID, Prozac 20 mg daily, Trazodone at bedtime --Remeron 7.5 mg at bedtime was added by psychiatry   Metabolic acidosis - resolved --Monitor BMP   Hypophosphatemia Replaced   Hypomagnesemia Replaced   Hypokalemia Replaced   UTI (urinary tract infection) Urine culture only 20k colonies E. Coli   Prolonged QT interval Avoid QT prolonging drugs Serial ekg's Maintain K>4, Mg>2 Telemetry  Diarrhea Stool studies negative   Other specified hypothyroidism Both TSH and T4 elevated --Continue Synthroid 25 mcg    Pulmonary hypertension  Control blood pressure. O2 per protocol. Monitor hemodynamics closely.  Failure to thrive Poor PO intake --Palliative care consulted for GOC and code status discussions, appreciate assistance      Subjective: Pt seen in stepdown this AM, no family present at the time.  Pt reports feeling a little short of breath.  States she misses her children.  Denies other acute complaints.   Physical Exam: Vitals:   05/02/23 0600 05/02/23 0700 05/02/23 0800 05/02/23 0900  BP: (!) 119/50 137/65  125/60  Pulse: (!) 53 62  60  Resp: 18 19  17   Temp:   98 F (36.7 C)   TempSrc:   Axillary   SpO2: 96% 98%  97%  Weight:      Height:       General exam: awake, drowsy appearing, no acute distress, frail and chronically ill appearing HEENT: moist mucus membranes, hearing grossly normal  Respiratory system: lungs overall clear with referred upper airway secretion sounds, no wheezes, normal respiratory effort. Cardiovascular system: normal S1/S2, RRR, no pedal  edema.   Gastrointestinal system: soft, NT, ND Central nervous system: A&O x 3. no gross focal neurologic deficits, normal speech Extremities: moves all, no edema, normal tone Skin: dry, intact, normal temperature Psychiatry: depressed mood, congruent affect  Data Reviewed:  Notable labs --  Hbg improved 8.6 >> 9.2 BMP normal except glucose 134, Ca 8.7 Albumin 2.1  Family Communication: at bedside on rounds 4/2.  Updated son [name] by phone this afternoon 4/4.   Disposition: Status is: Inpatient Remains inpatient appropriate because: labile HR's, not cleared by consultants, needs SNF   Planned Discharge Destination: Skilled nursing facility    Time spent: 45 minutes  Author: Pennie Banter, DO 05/02/2023 12:43 PM  For on call review [email or web address].

## 2023-05-02 NOTE — TOC Progression Note (Signed)
 Transition of Care [facility]) - Progression Note    Patient Details  Name: Beth [name] MRN: [id number] Date of Birth: Jul 29, 1945  [facility] CM/SW Contact  Margarito Liner, LCSW Phone Number: 05/02/2023, 12:20 PM  Clinical Narrative:  Attending physician listed anticipated medical readiness date as Monday 4/7. [facility] admissions coordinator is aware.   Expected Discharge Plan: Skilled Nursing Facility Barriers to Discharge: Continued Medical Work up  Expected Discharge Plan and Services       Living arrangements for the past 2 months: Skilled Nursing Facility                                       Social Determinants of Health (SDOH) Interventions SDOH Screenings   Food Insecurity: No Food Insecurity (04/25/2023)  Housing: Low Risk  (04/25/2023)  Transportation Needs: Unmet Transportation Needs (04/25/2023)  Utilities: Not At Risk (04/25/2023)  Alcohol Screen: Low Risk  (02/01/2022)  Depression (PHQ2-9): High Risk (05/13/2022)  Financial Resource Strain: Low Risk  (02/01/2022)  Physical Activity: Insufficiently Active (02/01/2022)  Social Connections: Socially Isolated (04/25/2023)  Stress: No Stress Concern Present (02/01/2022)  Tobacco Use: High Risk (04/24/2023)  Health Literacy: Adequate Health Literacy (09/16/2022)    Readmission Risk Interventions    04/26/2023    4:30 PM 09/09/2022   12:06 PM  Readmission Risk Prevention Plan  Transportation Screening Complete Complete  PCP or Specialist Appt within 3-5 Days Complete   HRI or Home Care Consult Complete   Social Work Consult for Recovery Care Planning/Counseling Complete   Palliative Care Screening Not Applicable   Medication Review Oceanographer) Complete Referral to Pharmacy  PCP or Specialist appointment within 3-5 days of discharge  Complete  HRI or Home Care Consult  Complete  SW Recovery Care/Counseling Consult  Complete  Palliative Care Screening  Complete  Skilled Nursing Facility   Complete

## 2023-05-02 NOTE — ED Notes (Signed)
 Rescinded IVC  Filed with [facility] Scanned Into patient's chart Tubed original paper work up to [employer]

## 2023-05-02 NOTE — Consult Note (Addendum)
 Consultation Note Date: 05/02/2023 at 1445  Patient Name: Beth [name]  DOB: 06-Sep-1945  MRN: [id number]  Age / Sex: 78 y.o., female  PCP: Smitty Cords, DO Referring Physician: Pennie Banter, DO  HPI/Patient Profile: 78 y.o. female with medical history significant for CVA with left-sided residual weakness, depression, anxiety, neuropathy, insomnia, atrial fibrillation on Eliquis, COPD on home O2 at 3 L who was brought to the ED after she tried to stab herself with scissors.  She endorses feeling suicidal.  Patient recently recovered from a bout of pneumonia, completing antibiotic treatment.  She did endorse diarrhea in the few days prior.  She has had very poor oral intake.  ED course and data review: BP slightly elevated on arrival at 170/72, pulse in the 50s otherwise normal vitals Labs notable for potassium of 2.2 and magnesium 1.6.  TSH slightly elevated at 5.528.  Free T4 also elevated at 1.66 Urinalysis with moderate leukocytes CBC WNL, BMP except for potassium WNL. UDS, salicylate, EtOH and acetaminophen levels within normal parameters EKG, personally reviewed and interpreted showing sinus bradycardia at 51 and prolonged QT to 648   Patient was started on ceftriaxone for possibility of UTI.  Also given oral and IV potassium repletion.   Seen by behavioral health.   Hospitalist consulted for admission.    3/28.  Potassium still low this morning IV and oral potassium ordered.  3/29.  Psychiatry changed her medications over to Remeron at night secondary to prolonged QT.  Electrolytes much improved.  Patient felt short of breath this morning.  Chest x-ray showing overall improvement in bilateral airspace opacities from prior studies.   Patient had worsening shortness of breath and was acidotic and sent to the ICU for BiPAP and started on aggressive antibiotics and steroids.   3/30.   Patient did not like the BiPAP mask.  Placed back on nasal cannula.  On IV fluids.  Continue Zosyn.  Discontinue vancomycin with MRSA PCR being negative.  Continue Solu-Medrol.   3/31 -- pt acutely decompensated with flash pulmonary edema, in addition to SVT/VT followed by A-fib with RVR.  Pt had to emergently intubated to transferred to ICU.   In ICU, Cardiology was consulted.  Patient was aggressively diuresed and placed on IV amiodarone. Tested + for non-Covid coronoavirus OC43. Extubated 4/1.   4/2 -- TRH resumed care, pt extubated yesterday.   RHC cath today, see report and Cardiology recommendations.  4/4 IVC rescinded today, PMT re-consulted to discuss goals of care.     Clinical Assessment and Goals of Care: Extensive chart review completed prior to meeting patient including labs, vital signs, imaging, progress notes, orders, and available advanced directive documents from current and previous encounters. I then met with patient to discuss diagnosis prognosis, GOC, EOL wishes, disposition and options.  I introduced Palliative Medicine as specialized medical care for people living with serious illness. It focuses on providing relief from the symptoms and stress of a serious illness. The goal is to improve quality of life for both  the patient and the family.  Ill-appearing, elderly female resting in bed. She is alert to self, time, location and situation. She is calm and pleasant. She is in no distress. She denies pain but endorses SOB. She has increased WOB that is exacerbated with talking.   We discussed a brief life review of the patient. Beth [name] is not married stating her husband left a long time ago. She has two children, Beth [name] and Beth [name], several grand and great grandchildren. 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   As far as functional and nutritional status she shares that she went to nursing home after  she became sick and was not able to care for herself. She reports that she was able to walk around with walker and feed herself at [facility] nursing home where she resides. She shares that prior to her admit, she was not eating much due to no appetite.   Beth [name] is tearful and shares that she had to leave her dog, Beth [name], that she had for 13 years. Her son cared for her dog, but her dog died soon after. Due to her profound grief, she wanted to die so she could be with her dog. She endorses this event triggered her SI PTA.  We discussed patient's current illness and what it means in the larger context of patient's on-going co-morbidities.  Natural disease trajectory and expectations at EOL were discussed.  I attempted to elicit values and goals of care important to the patient. She does not want to go back to her facility and states she does not like living there. Beth [name] adds that she has nothing to live for and she is lonely but denies SI at this time.   The difference between aggressive medical intervention and comfort care was considered in light of the patient's goals of care. Beth [name] wants to continue medical treatment at this time.   Advance directives, concepts specific to code status, artificial feeding and hydration, and rehospitalization were considered and discussed. After much discussion, she wants to change her code status to DNR/DNI stating she does not want CPR or to be placed back on the breathing machine.   Education offered regarding concept specific to human mortality and the limitations of medical interventions to prolong life when the body begins to [name] to thrive.  Family is facing treatment option decisions, advanced directive, and anticipatory care needs. She identifies Beth [name] as her Science writer if she is unable to make decisions for herself. I attempted to contact Beth [name] x2 without success. Left VM and will try at later time.    Discussed with  patient the importance of continued conversation with family and the medical providers regarding overall plan of care and treatment options, ensuring decisions are within the context of the patient's values and GOCs.    Questions and concerns were addressed. The patient was encouraged to call with questions or concerns. I advised that I would check on her in a couple of days.   Primary Decision Maker PATIENT  Physical Exam Constitutional:      General: She is not in acute distress.    Appearance: She is ill-appearing.  HENT:     Nose:     Comments: O2 via [place]    Mouth/Throat:     Mouth: Mucous membranes are dry.  Pulmonary:     Comments: Increased WOB Skin:    General: Skin is warm  and dry.  Neurological:     Mental Status: She is alert and oriented to person, place, and time.  Psychiatric:        Mood and Affect: Mood normal.        Behavior: Behavior normal.        Thought Content: Thought content normal.        Judgment: Judgment normal.     Palliative Assessment/Data: 40%     Thank you for this consult. Palliative medicine will continue to follow and assist holistically.   Time Total: 75 minutes  Time spent includes: Detailed review of medical records (labs, imaging, vital signs), medically appropriate exam (mental status, respiratory, cardiac, skin), discussed with treatment team, counseling and educating patient, family and staff, documenting clinical information, medication management and coordination of care.     Alex Gardener, Juel Burrow- [facility] Palliative Medicine Team  05/02/2023 2:54 PM  Office [phone]  Pager [phone]     Please contact Palliative Medicine Team providers via AMION for questions and concerns.

## 2023-05-02 NOTE — Progress Notes (Signed)
 PHARMACY CONSULT NOTE - FOLLOW UP  Pharmacy Consult for Electrolyte Monitoring and Replacement   Recent Labs: Potassium (mmol/L)  Date Value  05/02/2023 4.2  12/26/2012 3.3 (L)   Magnesium (mg/dL)  Date Value  16/10/9602 2.1  12/25/2012 1.8   Calcium (mg/dL)  Date Value  54/09/8117 8.7 (L)   Calcium, Total (mg/dL)  Date Value  14/78/2956 9.1   Albumin (g/dL)  Date Value  21/30/8657 2.3 (L)  03/23/2015 3.6  12/20/2012 3.5   Phosphorus (mg/dL)  Date Value  84/69/6295 2.9   Sodium (mmol/L)  Date Value  05/02/2023 142  01/01/2021 143  12/26/2012 144     Assessment: 78 yo F with PMH including CVA, Afib (on Eliquis) presenting with hypokalemia. Per patient, recently experienced diarrhea for a few days due to taking antibiotic for pneumonia. QTc on ECG this morning was 648. Pharmacy has been consulted to manage electrolytes.  Diuretics: furosemide 40 mg po once daily  Goal of Therapy:  Potassium 4.0 - 5.1 mmol/L Magnesium 2.0 - 2.4 mg/dL All Other Electrolytes WNL  Plan: no electrolyte replacement warranted for today Recheck electrolytes in am   Lowella Bandy, PharmD Clinical Pharmacist 05/02/2023 7:15 AM

## 2023-05-02 NOTE — Progress Notes (Signed)
 Patient transferred from ICU14 to room 256 via bed.  Report called to floor RN, all questions answered.  Patient changed over to 2A telemetry once in room and verified by 2A and ICU RN.

## 2023-05-02 NOTE — Consult Note (Signed)
  Psychiatric Consult Follow up  Patient Name: .Beth [name]  MRN: [id number]  DOB: [birth year]  Consult Order details:  Orders (From admission, onward)     Start     Ordered   04/25/23 0102  IP CONSULT TO PSYCHIATRY       Ordering Provider: Pilar Jarvis, MD  Provider:  (Not yet assigned)  Question Answer Comment  Place call to: ED   Reason for Consult Admit      04/25/23 0101             Mode of Visit: In person    Psychiatry Consult Evaluation  Service Date: May 02, 2023 LOS:  LOS: 7 days  Chief Complaint "I didn't want to live anymore"  Primary Psychiatric Diagnoses  MDD 2.  GAD   Assessment  Beth [name] is a 78 y.o. female admitted: Medicallyfor 04/25/2023 12:02 AM had initially presented to the ED with chief complaints of suicide attempt by attempting to stab herself with scissors, reports of diarrhea starting a few days prior to presentation, and found to be hypokalemic, low magnesium, possible UTI, prolonged QTc. She carries the psychiatric diagnoses of depression, anxiety and has a past medical history of CVA with left-sided residual weakness, neuropathy, insomnia, atrial fibrillation on Eliquis, COPD on home O2 at 3 L, HTN, CHF . Psychiatry was consulted to evaluate for safety and treat.  Patient consistently denies SI/HI/intent/plan.  She continues to express future oriented nests but wanting to get better and maintain an independent life.  Patient is not meeting IVC criteria at this time as she is agreeable to treatment and participating in treatment and maintaining safe behaviors.  IVC discontinued..  Diagnoses:  Active Hospital problems: Principal Problem:   Acute on chronic respiratory failure with hypoxia and hypercapnia (HCC) Active Problems:   Pulmonary hypertension (HCC)   Chronic diastolic congestive heart failure (HCC)   Depression with suicidal ideation   Asthma with COPD (HCC)   Hypokalemia   Prolonged QT interval   UTI (urinary  tract infection)   Atrial fibrillation with slow ventricular response (HCC)   Other specified hypothyroidism   Hypomagnesemia   Suicidal ideation   Diarrhea   Hypophosphatemia   Septic shock (HCC)   Metabolic acidosis   Multifocal pneumonia   Precordial chest pain   Respiratory distress   Protein-calorie malnutrition, severe   Decompensated heart failure (HCC)   MDD (major depressive disorder), recurrent episode (HCC)   Suicidal ideations    Plan   ## Psychiatric Medication Recommendations:  Prozac titrate dup to 20 mg daily to help with depression ( QTc is WNL) Buspar 10 mg BID Lamictal increased to 25 mg daily for mood stabilization Remeron 7.5 mg HS for depression/sleep/appetite Continue with 1:1 sitter, uphold IVC, will continue to follow and reassess for need to admit to inpatient [facility] unit   ## Medical Decision Making Capacity: Not specifically addressed in this encounter  ## Further Work-up:  --  EKG  EKG on 04/27/23 Qtc 368 -- Pertinent labwork reviewed earlier this admission includes: CBC, electrolytes repleted WNL    ## Disposition:--  No psychiatric contraindication for discharge after medical clearance  ## Behavioral / Environmental: - No specific recommendations at this time.     ## Safety and Observation Level:  - Based on my clinical evaluation, I estimate the patient to be at low  risk of self harm in the current setting. - At this time, we recommend routine observation this decision is based on  my review of the chart including patient's history and current presentation, interview of the patient, mental status examination, and consideration of suicide risk including evaluating suicidal ideation, plan, intent, suicidal or self-harm behaviors, risk factors, and protective factors. This judgment is based on our ability to directly address suicide risk, implement suicide prevention strategies, and develop a safety plan while the patient is in the clinical setting.  Please contact our team if there is a concern that risk level has changed.  CSSR Risk Category:C-SSRS RISK CATEGORY: Low  Suicide Risk Assessment: Patient has following modifiable risk factors for suicide: Medical problems by getting the treatment and support in the community Patient has following non-modifiable or demographic risk factors for suicide: history of suicide attempt and psychiatric hospitalization Patient has the following protective factors against suicide: Access to outpatient mental health care and Supportive family  Thank you for this consult request. Recommendations have been communicated to the primary team.  We will continue to follow pt as needed at this time.   Verner Chol, MD       History of Present Illness  Relevant Aspects of [facility] Course:  Admitted on 04/25/2023 for chief complaints of suicide attempt by attempting to stab herself with scissors, reports of diarrhea starting a few days prior to presentation, and found to be hypokalemic, low magnesium, possible UTI, prolonged Qtc.  Patient was started on ceftriaxone for possible UTI and urine culture ordered, oral and IV repletion of potassium, oral repletion of magnesium, was seen in the emergency department by psychiatry and started on Prozac and Zyprexa.  TSH and T4 were found to be elevated, continued on Synthroid 25 mcg daily.  Per history of chronic diastolic congestive heart failure, Lasix was held as well as metoprolol due to bradycardia.  Patient is noted to be resting in bed.  Sitter was discontinued.  She seems to be in a good mood and smiling.  She reports feeling better.  She denies suicidal/homicidal ideation/plan.  She denies auditory/visual hallucinations.  She is taking her medications with no reported side effects.  She is participating in treatment.    Psychiatric and Social History  Psychiatric History:  Information collected from pt  Prev Dx/Sx: depression, anxiety  Current Psych  Provider: provider via nursing facility Home Meds (current): per chart review: Buspar 10 mg BID, Prozac 10 mg daily, lamotrigine 12.5 mg daily, Trazodone 50 mg HS Previous Med Trials: pt reports Klonopin, remeron Therapy: denies current   Prior Psych Hospitalization: reports multiple  Prior Self Harm: pt denies  Prior Violence: pt denies   Family Psych History: parents - depression, son - depression (per chart review, bipolar)/ reports that her  Family Hx suicide: pt reports maternal and paternal cousin   Social History:  Developmental Hx: normal Educational Hx: some college 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  Living Situation: nursing home, [facility] x6 months  Spiritual Hx: Christian  Access to weapons/lethal means: denies    Substance History Alcohol: denies  Type of alcohol  denies Last Drink denies Number of drinks per day denies History of alcohol withdrawal seizures denies History of DT's denies Tobacco: yes Illicit drugs: denies Prescription drug abuse: denies Rehab hx: denies  Exam Findings   Vital Signs:  Temp:  [97.5 F (36.4 C)-98.4 F (36.9 C)] 98.3 F (36.8 C) (04/04 1400) Pulse Rate:  [46-135] 101 (04/04 1600) Resp:  [17-25] 19 (04/04 1600) BP: (105-149)/(48-86) 122/60 (04/04 1600) SpO2:  [91 %-100 %] 96 % (04/04 1600) FiO2 (%):  [  32 %] 32 % (04/04 0326) Weight:  [55.9 kg] 55.9 kg (04/04 0500) Blood pressure 122/60, pulse (!) 101, temperature 98.3 F (36.8 C), temperature source Oral, resp. rate 19, height 5\' 5"  (1.651 m), weight 55.9 kg, SpO2 96%. Body mass index is 20.51 kg/m.   Mental Status Exam: General Appearance: Disheveled  Orientation:  Full (Time, Place, and Person)  Memory:  Immediate;   Fair Recent;   Fair Remote;   Fair  Concentration:  Concentration: Good and Attention Span: Good  Recall:  Fair  Attention  Good  Eye Contact:  Good  Speech:  Normal Rate  Language:  Fair  Volume:  Normal  Mood: "depressed"   Affect:  Congruent  Thought Process:  Coherent and Linear  Thought Content:  WDL  Suicidal Thoughts:  No  Homicidal Thoughts:  No  Judgement:  Fair  Insight:  Fair  Psychomotor Activity:  Normal  Akathisia:  NA  Fund of Knowledge:  Fair      Assets:  Architect Housing  Cognition:  WNL  ADL's:  Impaired  AIMS (if indicated):        Other History   These have been pulled in through the EMR, reviewed, and updated if appropriate.  Family History:  The patient's family history includes Alcohol abuse in her father; Anxiety disorder in her mother; Bipolar disorder in her son; Breast cancer (age of onset: 3) in her mother; Cancer in her father; Depression in her father and mother; Gallbladder disease in her father.  Medical History: Past Medical History:  Diagnosis Date   Allergy    Anxiety    Asthma    Chronic pain syndrome    discharged from pain clinic, hx of narcotics seeking behavior   COPD (chronic obstructive pulmonary disease) (HCC)    CVA (cerebral infarction)    Depression    Fibromyalgia    Headache    Hyperlipidemia    Hypertension    IBS (irritable bowel syndrome)    Stroke (HCC)    Vitamin D deficiency     Surgical History: Past Surgical History:  Procedure Laterality Date   ABDOMINAL HYSTERECTOMY     APPENDECTOMY     BREAST EXCISIONAL BIOPSY  2011   Pt states lump removed, ? side, no scar seen   BREAST SURGERY  2011   biopsy   CERVICAL DISCECTOMY     CHOLECYSTECTOMY     RIGHT HEART CATH N/A 04/30/2023   Procedure: RIGHT HEART CATH;  Surgeon: Marcina Millard, MD;  Location: [facility] INVASIVE CV LAB;  Service: Cardiovascular;  Laterality: N/A;   SINUSOTOMY       Medications:   Current Facility-Administered Medications:    acetaminophen (TYLENOL) tablet 650 mg, 650 mg, Oral, Q6H PRN **OR** acetaminophen (TYLENOL) suppository 650 mg, 650 mg, Rectal, Q6H PRN, Andris Baumann, MD   acetaminophen (TYLENOL)  tablet 650 mg, 650 mg, Oral, Q4H PRN, Paraschos, Alexander, MD, 650 mg at 05/01/23 1507   amiodarone (PACERONE) tablet 200 mg, 200 mg, Oral, BID, 200 mg at 05/02/23 0941 **FOLLOWED BY** [START ON 05/11/2023] amiodarone (PACERONE) tablet 200 mg, 200 mg, Oral, Daily, Hudson, Caralyn, PA-C   apixaban (ELIQUIS) tablet 5 mg, 5 mg, Oral, BID, Netta Neat, RPH, 5 mg at 05/02/23 0940   atorvastatin (LIPITOR) tablet 40 mg, 40 mg, Oral, QHS, Netta Neat, RPH, 40 mg at 05/01/23 2131   busPIRone (BUSPAR) tablet 10 mg, 10 mg, Oral, BID, Esaw Grandchild A, DO, 10 mg at 05/02/23  1610   Chlorhexidine Gluconate Cloth 2 % PADS 6 each, 6 each, Topical, Daily, Wieting, Richard, MD, 6 each at 05/02/23 1519   chlorpheniramine-HYDROcodone (TUSSIONEX) 10-8 MG/5ML suspension 5 mL, 5 mL, Oral, Q12H PRN, Mansy, Jan A, MD, 5 mL at 05/01/23 2132   docusate sodium (COLACE) capsule 100 mg, 100 mg, Oral, BID, Esaw Grandchild A, DO   feeding supplement (ENSURE ENLIVE / ENSURE PLUS) liquid 237 mL, 237 mL, Oral, TID BM, Griffith, Kelly A, DO, 237 mL at 05/02/23 1429   fentaNYL (SUBLIMAZE) bolus via infusion 25-100 mcg, 25-100 mcg, Intravenous, Q15 min PRN, Tukov-Yual, Magdalene S, NP, 100 mcg at 04/29/23 0423   fentaNYL (SUBLIMAZE) injection 25 mcg, 25 mcg, Intravenous, Once, Tukov-Yual, Magdalene S, NP   FLUoxetine (PROZAC) 20 MG/5ML solution 20 mg, 20 mg, Oral, Daily, Irwin Brakeman, Daphene Chisholm, MD, 20 mg at 05/02/23 0940   fluticasone furoate-vilanterol (BREO ELLIPTA) 200-25 MCG/ACT 1 puff, 1 puff, Inhalation, Daily, 1 puff at 05/02/23 0942 **AND** umeclidinium bromide (INCRUSE ELLIPTA) 62.5 MCG/ACT 1 puff, 1 puff, Inhalation, Daily, Lindajo Royal V, MD, 1 puff at 05/02/23 0942   furosemide (LASIX) tablet 40 mg, 40 mg, Oral, Daily, Hudson, Caralyn, PA-C, 40 mg at 05/02/23 0940   gabapentin (NEURONTIN) capsule 100 mg, 100 mg, Oral, TID, Esaw Grandchild A, DO, 100 mg at 05/02/23 1632   guaiFENesin (MUCINEX) 12 hr tablet 600 mg, 600 mg,  Oral, Q12H, Esaw Grandchild A, DO, 600 mg at 05/02/23 0940   HYDROcodone-acetaminophen (NORCO/VICODIN) 5-325 MG per tablet 1-2 tablet, 1-2 tablet, Oral, Q4H PRN, Andris Baumann, MD, 2 tablet at 04/29/23 2155   insulin aspart (novoLOG) injection 0-9 Units, 0-9 Units, Subcutaneous, Q4H, Manuela Schwartz, NP, 1 Units at 05/02/23 1633   ipratropium-albuterol (DUONEB) 0.5-2.5 (3) MG/3ML nebulizer solution 3 mL, 3 mL, Nebulization, Q4H PRN, Manuela Schwartz, NP, 3 mL at 05/02/23 1218   lamoTRIgine (LAMICTAL) tablet 25 mg, 25 mg, Oral, Daily, Verner Chol, MD, 25 mg at 05/02/23 0941   levothyroxine (SYNTHROID) tablet 12.5 mcg, 12.5 mcg, Oral, Q0600, Netta Neat, RPH, 12.5 mcg at 05/02/23 0541   LORazepam (ATIVAN) tablet 0.5 mg, 0.5 mg, Oral, Q8H PRN, Esaw Grandchild A, DO   melatonin tablet 5 mg, 5 mg, Oral, QHS, Netta Neat, RPH, 5 mg at 05/01/23 2131   metoprolol tartrate (LOPRESSOR) injection 5 mg, 5 mg, Intravenous, Q2H PRN, Hudson, Caralyn, PA-C   metoprolol tartrate (LOPRESSOR) tablet 25 mg, 25 mg, Oral, BID, Paraschos, Alexander, MD, 25 mg at 05/02/23 0541   mirtazapine (REMERON) tablet 7.5 mg, 7.5 mg, Oral, QHS, Netta Neat, RPH, 7.5 mg at 05/01/23 1950   montelukast (SINGULAIR) tablet 10 mg, 10 mg, Oral, QHS, Esaw Grandchild A, DO, 10 mg at 05/01/23 2132   morphine (PF) 2 MG/ML injection 2 mg, 2 mg, Intravenous, Q2H PRN, Andris Baumann, MD   multivitamin with minerals tablet 1 tablet, 1 tablet, Oral, Daily, Esaw Grandchild A, DO, 1 tablet at 05/02/23 0950   nitroGLYCERIN (NITROSTAT) SL tablet 0.4 mg, 0.4 mg, Sublingual, Q5 min PRN, Andris Baumann, MD   ondansetron [facility]) injection 4 mg, 4 mg, Intravenous, Once, Manuela Schwartz, NP   Oral care mouth rinse, 15 mL, Mouth Rinse, PRN, Aundria Rud, Khabib, MD   pantoprazole (PROTONIX) injection 40 mg, 40 mg, Intravenous, QHS, Lowella Bandy, RPH, 40 mg at 05/01/23 2132   piperacillin-tazobactam (ZOSYN) IVPB 3.375 g, 3.375 g, Intravenous,  Q8H, Griffith, Kelly A, DO, Last Rate: 12.5 mL/hr at 05/02/23 0818, 3.375 g at  05/02/23 0818   [START ON 05/03/2023] polyethylene glycol (MIRALAX / GLYCOLAX) packet 17 g, 17 g, Oral, Daily, Pennie Banter, DO   [START ON 05/03/2023] predniSONE (DELTASONE) tablet 40 mg, 40 mg, Oral, Q breakfast, Esaw Grandchild A, DO   sodium chloride flush (NS) 0.9 % injection 3 mL, 3 mL, Intravenous, Q12H, Paraschos, Alexander, MD, 3 mL at 05/02/23 0941   sodium chloride flush (NS) 0.9 % injection 3 mL, 3 mL, Intravenous, PRN, Paraschos, Alexander, MD   traZODone (DESYREL) tablet 50 mg, 50 mg, Oral, QHS, Esaw Grandchild A, DO, 50 mg at 05/01/23 2132  Allergies: Allergies  Allergen Reactions   Bextra  [Valdecoxib]    Compazine [Prochlorperazine Edisylate]     Stroke-like symptoms   Lithium Carbonate     Leg weakness   Lyrica [Pregabalin]     Verner Chol, MD

## 2023-05-02 NOTE — Plan of Care (Signed)
  Problem: Education: Goal: Knowledge of General Education information will improve Description: Including pain rating scale, medication(s)/side effects and non-pharmacologic comfort measures Outcome: Progressing   Problem: Health Behavior/Discharge Planning: Goal: Ability to manage health-related needs will improve Outcome: Progressing   Problem: Clinical Measurements: Goal: Ability to maintain clinical measurements within normal limits will improve Outcome: Progressing Goal: Will remain free from infection Outcome: Progressing Goal: Diagnostic test results will improve Outcome: Progressing Goal: Respiratory complications will improve Outcome: Progressing   Problem: Activity: Goal: Risk for activity intolerance will decrease Outcome: Progressing   Problem: Nutrition: Goal: Adequate nutrition will be maintained Outcome: Progressing   Problem: Coping: Goal: Level of anxiety will decrease Outcome: Progressing   Problem: Elimination: Goal: Will not experience complications related to bowel motility Outcome: Progressing Goal: Will not experience complications related to urinary retention Outcome: Progressing   Problem: Pain Managment: Goal: General experience of comfort will improve and/or be controlled Outcome: Progressing   Problem: Safety: Goal: Ability to remain free from injury will improve Outcome: Progressing   Problem: Skin Integrity: Goal: Risk for impaired skin integrity will decrease Outcome: Progressing   Problem: Education: Goal: Ability to describe self-care measures that may prevent or decrease complications (Diabetes Survival Skills Education) will improve Outcome: Progressing Goal: Individualized Educational Video(s) Outcome: Progressing   Problem: Coping: Goal: Ability to adjust to condition or change in health will improve Outcome: Progressing   Problem: Fluid Volume: Goal: Ability to maintain a balanced intake and output will  improve Outcome: Progressing   Problem: Health Behavior/Discharge Planning: Goal: Ability to identify and utilize available resources and services will improve Outcome: Progressing Goal: Ability to manage health-related needs will improve Outcome: Progressing   Problem: Metabolic: Goal: Ability to maintain appropriate glucose levels will improve Outcome: Progressing   Problem: Nutritional: Goal: Maintenance of adequate nutrition will improve Outcome: Progressing Goal: Progress toward achieving an optimal weight will improve Outcome: Progressing   Problem: Skin Integrity: Goal: Risk for impaired skin integrity will decrease Outcome: Progressing   Problem: Tissue Perfusion: Goal: Adequacy of tissue perfusion will improve Outcome: Progressing   Problem: Activity: Goal: Ability to tolerate increased activity will improve Outcome: Progressing   Problem: Respiratory: Goal: Ability to maintain a clear airway and adequate ventilation will improve Outcome: Progressing   Problem: Role Relationship: Goal: Method of communication will improve Outcome: Progressing   Problem: Education: Goal: Understanding of CV disease, CV risk reduction, and recovery process will improve Outcome: Progressing Goal: Individualized Educational Video(s) Outcome: Progressing   Problem: Activity: Goal: Ability to return to baseline activity level will improve Outcome: Progressing   Problem: Cardiovascular: Goal: Ability to achieve and maintain adequate cardiovascular perfusion will improve Outcome: Progressing Goal: Vascular access site(s) Level 0-1 will be maintained Outcome: Progressing   Problem: Health Behavior/Discharge Planning: Goal: Ability to safely manage health-related needs after discharge will improve Outcome: Progressing

## 2023-05-02 NOTE — Progress Notes (Signed)
 [facility] CLINIC CARDIOLOGY PROGRESS NOTE       Patient ID: Beth [name] MRN: [id number] DOB/AGE: [birth year] 78 y.o.  Admit date: 04/25/2023 Referring Physician Dr. Renae Gloss Primary Physician Althea Charon, Netta Neat, DO  Primary Cardiologist Dr. Melton Alar Reason for Consultation tachycardia, elevated troponin  HPI: Beth [name] is a 78 y.o. female  with a past medical history of paroxysmal atrial fibrillation on eliquis, hypertension, hyperlipidemia, COPD on home 3L, depression who presented to the ED on 04/25/2023 for suicidal ideation. Has been treated for pneumonia and acute on chronic respiratory failure, decompensated earlier this AM requiring intubation. Was tachycardia and troponins found to be elevated this AM as well. Cardiology was consulted for further evaluation.   Interval history: -Patient seen and examined this morning, resting comfortably. -Respiratory status remains stable on baseline O2. -Started going in and out of atrial fibrillation again overnight. -BP remains stable.  Review of systems complete and found to be negative unless listed above    Past Medical History:  Diagnosis Date   Allergy    Anxiety    Asthma    Chronic pain syndrome    discharged from pain clinic, hx of narcotics seeking behavior   COPD (chronic obstructive pulmonary disease) (HCC)    CVA (cerebral infarction)    Depression    Fibromyalgia    Headache    Hyperlipidemia    Hypertension    IBS (irritable bowel syndrome)    Stroke (HCC)    Vitamin D deficiency     Past Surgical History:  Procedure Laterality Date   ABDOMINAL HYSTERECTOMY     APPENDECTOMY     BREAST EXCISIONAL BIOPSY  2011   Pt states lump removed, ? side, no scar seen   BREAST SURGERY  2011   biopsy   CERVICAL DISCECTOMY     CHOLECYSTECTOMY     RIGHT HEART CATH N/A 04/30/2023   Procedure: RIGHT HEART CATH;  Surgeon: Marcina Millard, MD;  Location: [facility] INVASIVE CV LAB;  Service: Cardiovascular;   Laterality: N/A;   SINUSOTOMY      Medications Prior to Admission  Medication Sig Dispense Refill Last Dose/Taking   acetaminophen (TYLENOL) 500 MG tablet Take 500 mg by mouth 3 (three) times daily as needed for mild pain (pain score 1-3).   Taking As Needed   amiodarone (PACERONE) 200 MG tablet Take 1 tablet (200 mg total) by mouth 2 (two) times daily for 12 days, THEN 1 tablet (200 mg total) daily.   04/24/2023 at  8:00 AM   apixaban (ELIQUIS) 5 MG TABS tablet Take 1 tablet (5 mg total) by mouth 2 (two) times daily. 60 tablet 1 04/24/2023 at  5:00 PM   atorvastatin (LIPITOR) 40 MG tablet TAKE 1 TABLET BY MOUTH EVERY DAY (Patient taking differently: Take 40 mg by mouth at bedtime. TAKE 1 TABLET BY MOUTH EVERY DAY) 90 tablet 3 04/24/2023 at  6:00 PM   busPIRone (BUSPAR) 10 MG tablet Take 10 mg by mouth 2 (two) times daily.   04/24/2023 at  4:00 PM   camphor-menthol (SARNA) lotion Apply 1 Application topically 2 (two) times daily.   04/24/2023 at  3:00 PM   FLUoxetine (PROZAC) 10 MG tablet Take 15 mg by mouth daily.   04/24/2023 at  8:00 AM   furosemide (LASIX) 40 MG tablet Take 1 tablet (40 mg total) by mouth daily. If swelling is less, can take only 1 pill.   04/24/2023 at  8:00 AM   gabapentin (NEURONTIN) 100 MG capsule  Take 1 capsule (100 mg total) by mouth 3 (three) times daily.   04/24/2023 at  8:00 PM   guaiFENesin (MUCINEX) 600 MG 12 hr tablet Take 600 mg by mouth every 12 (twelve) hours.   04/24/2023 at  8:00 PM   lamoTRIgine (LAMICTAL) 25 MG tablet Take 12.5 mg by mouth daily.   04/24/2023 at  8:00 AM   levothyroxine (SYNTHROID) 25 MCG tablet Take 1 tablet (25 mcg total) by mouth daily at 6 (six) AM. (Patient taking differently: Take 12.5 mcg by mouth daily at 6 (six) AM.)   04/24/2023 at  6:00 AM   LORazepam (ATIVAN) 0.5 MG tablet Take 0.5 mg by mouth every 8 (eight) hours as needed for anxiety, sleep, seizure or sedation.   Taking As Needed   melatonin 5 MG TABS Take 1 tablet (5 mg total) by mouth  at bedtime.   04/24/2023 at  8:00 PM   Menthol-Zinc Oxide (CALMOSEPTINE) 0.44-20.6 % OINT Apply 1 application  topically as directed. Apply to area at every shift change   04/24/2023 Evening   metoprolol tartrate (LOPRESSOR) 25 MG tablet Take 0.5 tablets (12.5 mg total) by mouth 2 (two) times daily.   04/24/2023 at  8:00 PM   mirtazapine (REMERON) 15 MG tablet Take 15 mg by mouth at bedtime.   04/24/2023 at  8:00 PM   montelukast (SINGULAIR) 10 MG tablet TAKE 1 TABLET(10 MG) BY MOUTH AT BEDTIME 90 tablet 1 04/24/2023 at  8:00 PM   NYAMYC powder Apply 1 Application topically 2 (two) times daily.   04/24/2023 at  4:00 PM   polyethylene glycol (MIRALAX / GLYCOLAX) 17 g packet Take 17 g by mouth daily as needed for mild constipation.   Taking As Needed   traMADol (ULTRAM) 50 MG tablet Take 50 mg by mouth 3 (three) times daily as needed for moderate pain (pain score 4-6).   Taking As Needed   traZODone (DESYREL) 50 MG tablet Take 1 tablet (50 mg total) by mouth at bedtime.   04/24/2023 at  8:00 PM   TRELEGY ELLIPTA 200-62.5-25 MCG/ACT AEPB INHALE 1 PUFF INTO THE LUNGS DAILY 60 each 2 04/24/2023 at  8:00 AM   Social History   Socioeconomic History   Marital status: Legally Separated    Spouse name: Not on file   Number of children: 2   Years of education: Not on file   Highest education level: Not on file  Occupational History   Occupation: retired  Tobacco Use   Smoking status: Some Days    Current packs/day: 0.00    Average packs/day: 0.3 packs/day for 57.0 years (14.3 ttl pk-yrs)    Types: Cigarettes    Start date: 10/25/1963    Last attempt to quit: 10/24/2020    Years since quitting: 2.5   Smokeless tobacco: Never   Tobacco comments:    16 cigarettes a week. Khj 07/26/2022  Vaping Use   Vaping status: Never Used  Substance and Sexual Activity   Alcohol use: No    Alcohol/week: 0.0 standard drinks of alcohol   Drug use: No   Sexual activity: Not Currently  Other Topics Concern   Not on  file  Social History Narrative   Not on file   Social Drivers of Health   Financial Resource Strain: Low Risk  (02/01/2022)   Overall Financial Resource Strain (CARDIA)    Difficulty of Paying Living Expenses: Not hard at all  Food Insecurity: No Food Insecurity (04/25/2023)   Hunger Vital  Sign    Worried About Programme researcher, broadcasting/film/video in the Last Year: Never true    Ran Out of Food in the Last Year: Never true  Transportation Needs: Unmet Transportation Needs (04/25/2023)   PRAPARE - Transportation    Lack of Transportation (Medical): Yes    Lack of Transportation (Non-Medical): Yes  Physical Activity: Insufficiently Active (02/01/2022)   Exercise Vital Sign    Days of Exercise per Week: 7 days    Minutes of Exercise per Session: 20 min  Stress: No Stress Concern Present (02/01/2022)   [employer] of Occupational Health - Occupational Stress Questionnaire    Feeling of Stress : Only a little  Social Connections: Socially Isolated (04/25/2023)   Social Connection and Isolation Panel [NHANES]    Frequency of Communication with Friends and Family: Twice a week    Frequency of Social Gatherings with Friends and Family: Once a week    Attends Religious Services: Never    Database administrator or Organizations: No    Attends Banker Meetings: Never    Marital Status: Separated  Intimate Partner Violence: Not At Risk (04/25/2023)   Humiliation, Afraid, Rape, and Kick questionnaire    Fear of Current or Ex-Partner: No    Emotionally Abused: No    Physically Abused: No    Sexually Abused: No    Family History  Problem Relation Age of Onset   Anxiety disorder Mother    Depression Mother    Breast cancer Mother 48   Cancer Father    Gallbladder disease Father    Alcohol abuse Father    Depression Father    Bipolar disorder Son      Vitals:   05/02/23 0600 05/02/23 0700 05/02/23 0800 05/02/23 0900  BP: (!) 119/50 137/65  125/60  Pulse: (!) 53 62  60  Resp: 18 19  17    Temp:   98 F (36.7 C)   TempSrc:   Axillary   SpO2: 96% 98%  97%  Weight:      Height:        PHYSICAL EXAM General: Chronically ill appearing elderly female, intubated and sedated. HEENT: Normocephalic and atraumatic. Neck: No JVD.  Lungs: Normal respiratory effort on 3L [place].  Heart: Irregularly irregular, fast rate. Normal S1 and S2 without gallops or murmurs.  Abdomen: Non-distended appearing.  Msk: Normal strength and tone for age. Extremities: Warm and well perfused. No clubbing, cyanosis. No edema.  Neuro: Alert and oriented X 3. Psych: Answers questions appropriately.   Labs: Basic Metabolic Panel: Recent Labs    05/01/23 0531 05/02/23 0419  NA 143 142  K 3.5 4.2  CL 106 106  CO2 30 29  GLUCOSE 175* 143*  BUN 21 23  CREATININE 0.65 0.87  CALCIUM 8.9 8.7*  MG 2.6* 2.1  PHOS 2.6 2.9   Liver Function Tests: Recent Labs    05/01/23 0531 05/02/23 0419  ALBUMIN 2.2* 2.3*   No results for input(s): "LIPASE", "AMYLASE" in the last 72 hours. CBC: Recent Labs    05/01/23 0531 05/01/23 1917 05/02/23 0419  WBC 10.4  --  9.0  HGB 8.5* 9.2* 9.1*  HCT 26.4* 28.6* 27.9*  MCV 92.3  --  93.0  PLT 165  --  163   Cardiac Enzymes: No results for input(s): "CKTOTAL", "CKMB", "CKMBINDEX", "TROPONINIHS" in the last 72 hours.  BNP: No results for input(s): "BNP" in the last 72 hours.  D-Dimer: No results for input(s): "DDIMER" in the  last 72 hours. Hemoglobin A1C: No results for input(s): "HGBA1C" in the last 72 hours.  Fasting Lipid Panel: No results for input(s): "CHOL", "HDL", "LDLCALC", "TRIG", "CHOLHDL", "LDLDIRECT" in the last 72 hours.  Thyroid Function Tests: No results for input(s): "TSH", "T4TOTAL", "T3FREE", "THYROIDAB" in the last 72 hours.  Invalid input(s): "FREET3" Anemia Panel: No results for input(s): "VITAMINB12", "FOLATE", "FERRITIN", "TIBC", "IRON", "RETICCTPCT" in the last 72 hours.   Radiology: CARDIAC CATHETERIZATION Result Date:  04/30/2023   Hemodynamic findings consistent with moderate pulmonary hypertension. 1.  Moderate pulmonary hypertension, PA mean 42 mmHg 2.  PCWP 28 mmHg 3.  Fick cardiac output 4.57 L/min   DG Chest Port 1 View Result Date: 04/29/2023 CLINICAL DATA:  78 year old female with respiratory failure. Intubated. EXAM: PORTABLE CHEST 1 VIEW COMPARISON:  Portable chest yesterday and earlier. FINDINGS: Portable AP view at 0511 hours. Mildly more rotated to the right. Endotracheal tube tip in good position between the clavicles and carina. Visible enteric tube loops in the stomach. Mediastinal contours remain within normal limits. Lung volumes slightly larger. Coarse bilateral pulmonary interstitial opacity with vague, confluent lung base opacity continues. But mildly improved bilateral ventilation since yesterday. No pneumothorax. No areas of worsening ventilation. Stable visualized osseous structures. Paucity of bowel gas. IMPRESSION: 1. Satisfactory endotracheal and enteric tubes. 2. Mildly improved bilateral lung volumes and ventilation since yesterday. Ongoing widespread coarse interstitial and more confluent bilateral lung base opacity. Electronically Signed   By: Odessa Fleming M.D.   On: 04/29/2023 07:59   ECHOCARDIOGRAM COMPLETE Result Date: 04/28/2023    ECHOCARDIOGRAM REPORT   Patient Name:   Beth [name] Date of Exam: 04/28/2023 Medical Rec #:  [id number]        Height:       65.0 in Accession #:    [id number]       Weight:       120.0 lb Date of Birth:  1945/02/18        BSA:          1.592 m Patient Age:    77 years         BP:           132/64 mmHg Patient Gender: F                HR:           65 bpm. Exam Location:  [facility] Procedure: 2D Echo, Cardiac Doppler and Color Doppler (Both Spectral and Color            Flow Doppler were utilized during procedure). Indications:     Elevated Troponin  History:         Patient has prior history of Echocardiogram examinations, most                  recent 09/09/2022. COPD and  Stroke, Arrythmias:Atrial                  Fibrillation, Signs/Symptoms:Dyspnea; Risk                  Factors:Hypertension, Dyslipidemia and Current Smoker. CKD.  Sonographer:     Mikki Harbor Referring Phys:  [id number] Andris Baumann Diagnosing Phys: Yvonne Kendall MD  Sonographer Comments: Echo performed with patient supine and on artificial respirator. Image acquisition challenging due to respiratory motion. IMPRESSIONS  1. Left ventricular ejection fraction, by estimation, is 55 to 60%. The left ventricle has normal function. The left ventricle has no regional wall motion  abnormalities. Left ventricular diastolic parameters were normal.  2. Right ventricular systolic function is mildly reduced. The right ventricular size is normal. Mildly increased right ventricular wall thickness. There is severely elevated pulmonary artery systolic pressure.  3. Left atrial size was moderately dilated.  4. Moderate pleural effusion in the left lateral region.  5. The mitral valve is abnormal. Moderate to severe mitral valve regurgitation.  6. Tricuspid valve regurgitation is moderate to severe.  7. The aortic valve is tricuspid. There is mild calcification of the aortic valve. There is mild thickening of the aortic valve. Aortic valve regurgitation is mild. Aortic valve sclerosis/calcification is present, without any evidence of aortic stenosis.  8. The inferior vena cava is dilated in size with <50% respiratory variability, suggesting right atrial pressure of 15 mmHg. FINDINGS  Left Ventricle: Left ventricular ejection fraction, by estimation, is 55 to 60%. The left ventricle has normal function. The left ventricle has no regional wall motion abnormalities. The left ventricular internal cavity size was normal in size. There is  borderline left ventricular hypertrophy. Left ventricular diastolic parameters were normal. Right Ventricle: The right ventricular size is normal. Mildly increased right ventricular wall thickness.  Right ventricular systolic function is mildly reduced. There is severely elevated pulmonary artery systolic pressure. The tricuspid regurgitant velocity is 3.64 m/s, and with an assumed right atrial pressure of 15 mmHg, the estimated right ventricular systolic pressure is 68.0 mmHg. Left Atrium: Left atrial size was moderately dilated. Right Atrium: Right atrial size was normal in size. Pericardium: Trivial pericardial effusion is present. Mitral Valve: The mitral valve is abnormal. Mild to moderate mitral annular calcification. Moderate to severe mitral valve regurgitation. MV peak gradient, 7.1 mmHg. The mean mitral valve gradient is 2.0 mmHg. Tricuspid Valve: The tricuspid valve is normal in structure. Tricuspid valve regurgitation is moderate to severe. Aortic Valve: The aortic valve is tricuspid. There is mild calcification of the aortic valve. There is mild thickening of the aortic valve. Aortic valve regurgitation is mild. Aortic valve sclerosis/calcification is present, without any evidence of aortic stenosis. Aortic valve mean gradient measures 4.0 mmHg. Aortic valve peak gradient measures 8.0 mmHg. Aortic valve area, by VTI measures 2.04 cm. Pulmonic Valve: The pulmonic valve was not well visualized. Pulmonic valve regurgitation is not visualized. No evidence of pulmonic stenosis. Aorta: The aortic root and ascending aorta are structurally normal, with no evidence of dilitation. Venous: The inferior vena cava is dilated in size with less than 50% respiratory variability, suggesting right atrial pressure of 15 mmHg. IAS/Shunts: No atrial level shunt detected by color flow Doppler. Additional Comments: There is a moderate pleural effusion in the left lateral region.  LEFT VENTRICLE PLAX 2D LVIDd:         4.70 cm     Diastology LVIDs:         3.30 cm     LV e' medial:    8.59 cm/s LV PW:         1.13 cm     LV E/e' medial:  13.5 LV IVS:        0.80 cm     LV e' lateral:   13.30 cm/s LVOT diam:     1.90 cm      LV E/e' lateral: 8.7 LV SV:         67 LV SV Index:   42 LVOT Area:     2.84 cm  LV Volumes (MOD) LV vol d, MOD A2C: 53.5 ml LV vol d, MOD  A4C: 57.5 ml LV vol s, MOD A2C: 20.6 ml LV vol s, MOD A4C: 25.1 ml LV SV MOD A2C:     32.9 ml LV SV MOD A4C:     57.5 ml LV SV MOD BP:      31.8 ml RIGHT VENTRICLE RV Basal diam:  3.00 cm RV Mid diam:    2.30 cm LEFT ATRIUM             Index        RIGHT ATRIUM           Index LA diam:        4.20 cm 2.64 cm/m   RA Area:     14.70 cm LA Vol (A2C):   81.4 ml 51.13 ml/m  RA Volume:   35.50 ml  22.30 ml/m LA Vol (A4C):   76.5 ml 48.05 ml/m LA Biplane Vol: 79.6 ml 50.00 ml/m  AORTIC VALVE                    PULMONIC VALVE AV Area (Vmax):    2.21 cm     PV Vmax:       0.72 m/s AV Area (Vmean):   2.11 cm     PV Peak grad:  2.1 mmHg AV Area (VTI):     2.04 cm AV Vmax:           141.00 cm/s AV Vmean:          91.550 cm/s AV VTI:            0.328 m AV Peak Grad:      8.0 mmHg AV Mean Grad:      4.0 mmHg LVOT Vmax:         110.00 cm/s LVOT Vmean:        68.000 cm/s LVOT VTI:          0.236 m LVOT/AV VTI ratio: 0.72  AORTA Ao Root diam: 3.30 cm Ao Asc diam:  3.20 cm MITRAL VALVE                  TRICUSPID VALVE MV Area (PHT): 5.54 cm       TR Peak grad:   53.0 mmHg MV Area VTI:   1.91 cm       TR Vmax:        364.00 cm/s MV Peak grad:  7.1 mmHg MV Mean grad:  2.0 mmHg       SHUNTS MV Vmax:       1.33 m/s       Systemic VTI:  0.24 m MV Vmean:      56.9 cm/s      Systemic Diam: 1.90 cm MV Decel Time: 137 msec MR Peak grad:    145.9 mmHg MR Mean grad:    91.0 mmHg MR Vmax:         604.00 cm/s MR Vmean:        449.0 cm/s MR PISA:         2.26 cm MR PISA Eff ROA: 35 mm MR PISA Radius:  0.60 cm MV E velocity: 116.00 cm/s MV A velocity: 69.70 cm/s MV E/A ratio:  1.66 Cristal Deer End MD Electronically signed by Yvonne Kendall MD Signature Date/Time: 04/28/2023/7:14:48 PM    Final    DG Chest Port 1 View Result Date: 04/28/2023 CLINICAL DATA:  Intubation EXAM: PORTABLE CHEST 1 VIEW  COMPARISON:  Two days ago FINDINGS: Diffuse interstitial opacity with patchy airspace density greatest  at the right base, progressed at the right base. There is small volume pleural fluid likely. No pneumothorax. Endotracheal tube with tip between the clavicular heads and carina. An enteric tube reaches the stomach at least. Normal heart size IMPRESSION: Worsening infiltrate especially at the right lung base. New hardware in unremarkable position. Electronically Signed   By: Tiburcio Pea M.D.   On: 04/28/2023 10:26   DG Abd 1 View Result Date: 04/28/2023 CLINICAL DATA:  NG placement. EXAM: ABDOMEN - 1 VIEW COMPARISON:  None Available. FINDINGS: Enteric tube with tip in the region of the distal stomach. No bowel dilatation noted. Small bilateral pleural effusions and diffuse interstitial and bibasilar densities. IMPRESSION: Enteric tube with tip in the distal stomach. Electronically Signed   By: Elgie Collard M.D.   On: 04/28/2023 10:23   DG Chest Port 1 View Result Date: 04/26/2023 CLINICAL DATA:  Respiratory failure EXAM: PORTABLE CHEST 1 VIEW COMPARISON:  04/26/2023 at 9:13 a.m. FINDINGS: Increased predominantly interstitial opacities bilaterally greatest in the left mid/upper lung. No pleural effusion or pneumothorax. Stable cardiomediastinal silhouette. Aortic atherosclerotic calcification. IMPRESSION: Increased predominantly interstitial opacities bilaterally greatest in the left mid/upper lung. Findings favor pneumonia superimposed on a background emphysema. Electronically Signed   By: Minerva Fester M.D.   On: 04/26/2023 23:49   DG Chest Port 1 View Result Date: 04/26/2023 CLINICAL DATA:  Cough. Recent antibiotics for pneumonia with resulting diarrhea. History of CVA and atrial fibrillation. EXAM: PORTABLE CHEST 1 VIEW COMPARISON:  None recent. Chest CT 09/13/2022. Radiographs 09/13/2022 and 08/25/2022. FINDINGS: 0913 hours. The heart size and mediastinal contours are stable with mild aortic  atherosclerosis. The aeration of the lungs has improved compared with the available prior studies. There are patchy pulmonary opacities bilaterally which could reflect postinflammatory scarring or recurrent infection. There is no consolidation, significant pleural effusion or pneumothorax. The bones appear unchanged status post lower cervical fusion. IMPRESSION: Overall improvement in bilateral airspace opacities compared with prior studies from August. Patchy pulmonary opacities bilaterally which could reflect postinflammatory scarring or recurrent infection. No consolidation or pleural effusion. Electronically Signed   By: Carey Bullocks M.D.   On: 04/26/2023 13:05    ECHO as above  TELEMETRY reviewed by me 05/02/2023: Atrial fibrillation rate 100-110s  EKG reviewed by me: VT 180 bpm  Data reviewed by me 05/02/2023: last 24h vitals tele labs imaging I/O hospitalist progress notes, PCCM notes  Principal Problem:   Acute on chronic respiratory failure with hypoxia and hypercapnia (HCC) Active Problems:   Pulmonary hypertension (HCC)   Chronic diastolic congestive heart failure (HCC)   Depression with suicidal ideation   Asthma with COPD (HCC)   Hypokalemia   Prolonged QT interval   UTI (urinary tract infection)   Atrial fibrillation with slow ventricular response (HCC)   Other specified hypothyroidism   Hypomagnesemia   Suicidal ideation   Diarrhea   Hypophosphatemia   Septic shock (HCC)   Metabolic acidosis   Multifocal pneumonia   Precordial chest pain   Respiratory distress   Protein-calorie malnutrition, severe   Decompensated heart failure (HCC)   MDD (major depressive disorder), recurrent episode (HCC)   Suicidal ideations    ASSESSMENT AND PLAN:  Beth [name] is a 78 y.o. female  with a past medical history of paroxysmal atrial fibrillation on eliquis, hypertension, hyperlipidemia, COPD on home 3L, depression who presented to the ED on 04/25/2023 for suicidal ideation.  Has been treated for pneumonia and acute on chronic respiratory failure, decompensated earlier this  AM requiring intubation. Was tachycardia and troponins found to be elevated this AM as well. Cardiology was consulted for further evaluation.   # Acute on Chronic respiratory failure # Pneumonia # Ventricular Tachycardia # Demand Ischemia   # Paroxysmal Atrial fibrillation  Patient's respiratory status decompensated this morning that required intubation and had VT in 170-180s. Patient received 2x bolus of 150 mg Amio and IV 2g Mg, converted to NSR rate in 70s. Patient is hemodynamically stable. Trops elevated and flat 481 > 449 likely due to demand ischemia. Today labs showed elevated BNP 1612.  Echo with preserved EF, no wall motion abnormalities, mildly reduced RV function, severely elevated PASP, moderate to severe MR and TR, moderate pleural effusion.  RHC yesterday with moderate pulmonary hypertension, PA mean 42, wedge 28. -Continue p.o. amiodarone load.  Continue metoprolol tartrate 25 mg twice daily. -Will start p.o. Lasix 40 mg daily today -Continue Eliquis for stroke risk reduction  -Continue atorvastatin 40 mg per tube -Further management of respiratory failure per hospitalist service.   This patient's plan of care was discussed and created with Dr. Darrold Junker and he is in agreement.  Signed: Gale Journey, PA-C  05/02/2023, 9:20 AM [facility] Cardiology

## 2023-05-03 ENCOUNTER — Inpatient Hospital Stay

## 2023-05-03 DIAGNOSIS — F331 Major depressive disorder, recurrent, moderate: Secondary | ICD-10-CM | POA: Diagnosis not present

## 2023-05-03 DIAGNOSIS — J9622 Acute and chronic respiratory failure with hypercapnia: Secondary | ICD-10-CM | POA: Diagnosis not present

## 2023-05-03 DIAGNOSIS — J9621 Acute and chronic respiratory failure with hypoxia: Secondary | ICD-10-CM | POA: Diagnosis not present

## 2023-05-03 LAB — CBC
HCT: 26.3 % — ABNORMAL LOW (ref 36.0–46.0)
Hemoglobin: 8.4 g/dL — ABNORMAL LOW (ref 12.0–15.0)
MCH: 29.5 pg (ref 26.0–34.0)
MCHC: 31.9 g/dL (ref 30.0–36.0)
MCV: 92.3 fL (ref 80.0–100.0)
Platelets: 180 10*3/uL (ref 150–400)
RBC: 2.85 MIL/uL — ABNORMAL LOW (ref 3.87–5.11)
RDW: 17 % — ABNORMAL HIGH (ref 11.5–15.5)
WBC: 11.6 10*3/uL — ABNORMAL HIGH (ref 4.0–10.5)
nRBC: 0.3 % — ABNORMAL HIGH (ref 0.0–0.2)

## 2023-05-03 LAB — GLUCOSE, CAPILLARY
Glucose-Capillary: 108 mg/dL — ABNORMAL HIGH (ref 70–99)
Glucose-Capillary: 111 mg/dL — ABNORMAL HIGH (ref 70–99)
Glucose-Capillary: 133 mg/dL — ABNORMAL HIGH (ref 70–99)
Glucose-Capillary: 149 mg/dL — ABNORMAL HIGH (ref 70–99)
Glucose-Capillary: 199 mg/dL — ABNORMAL HIGH (ref 70–99)
Glucose-Capillary: 77 mg/dL (ref 70–99)
Glucose-Capillary: 78 mg/dL (ref 70–99)

## 2023-05-03 LAB — RENAL FUNCTION PANEL
Albumin: 2.1 g/dL — ABNORMAL LOW (ref 3.5–5.0)
Anion gap: 7 (ref 5–15)
BUN: 21 mg/dL (ref 8–23)
CO2: 30 mmol/L (ref 22–32)
Calcium: 8.5 mg/dL — ABNORMAL LOW (ref 8.9–10.3)
Chloride: 105 mmol/L (ref 98–111)
Creatinine, Ser: 0.89 mg/dL (ref 0.44–1.00)
GFR, Estimated: >60 mL/min (ref >60)
Glucose, Bld: 91 mg/dL (ref 70–99)
Phosphorus: 3.2 mg/dL (ref 2.5–4.6)
Potassium: 3.4 mmol/L — ABNORMAL LOW (ref 3.5–5.1)
Sodium: 142 mmol/L (ref 135–145)

## 2023-05-03 LAB — BRAIN NATRIURETIC PEPTIDE: B Natriuretic Peptide: 992.2 pg/mL — ABNORMAL HIGH (ref 0.0–100.0)

## 2023-05-03 LAB — MAGNESIUM: Magnesium: 2.2 mg/dL (ref 1.7–2.4)

## 2023-05-03 MED ORDER — VENLAFAXINE HCL ER 37.5 MG PO CP24
37.5000 mg | ORAL_CAPSULE | Freq: Every day | ORAL | Status: DC
  Administered 2023-05-04: 37.5 mg via ORAL
  Filled 2023-05-03: qty 1

## 2023-05-03 MED ORDER — POTASSIUM CHLORIDE CRYS ER 20 MEQ PO TBCR: 40.0000 meq | EXTENDED_RELEASE_TABLET | Freq: Once | ORAL | Status: DC

## 2023-05-03 MED ORDER — BUSPIRONE HCL 10 MG PO TABS
15.0000 mg | ORAL_TABLET | Freq: Two times a day (BID) | ORAL | Status: DC
  Administered 2023-05-03 – 2023-05-05 (×4): 15 mg via ORAL
  Filled 2023-05-03 (×4): qty 2

## 2023-05-03 MED ORDER — POTASSIUM CHLORIDE CRYS ER 20 MEQ PO TBCR
40.0000 meq | EXTENDED_RELEASE_TABLET | Freq: Two times a day (BID) | ORAL | Status: AC
Start: 2023-05-03 — End: 2023-05-03
  Administered 2023-05-03 (×2): 40 meq via ORAL
  Filled 2023-05-03 (×2): qty 2

## 2023-05-03 NOTE — Plan of Care (Signed)
  Problem: Education: Goal: Knowledge of General Education information will improve Description: Including pain rating scale, medication(s)/side effects and non-pharmacologic comfort measures Outcome: Progressing   Problem: Health Behavior/Discharge Planning: Goal: Ability to manage health-related needs will improve Outcome: Progressing   Problem: Clinical Measurements: Goal: Ability to maintain clinical measurements within normal limits will improve Outcome: Progressing Goal: Will remain free from infection Outcome: Progressing Goal: Diagnostic test results will improve Outcome: Progressing Goal: Respiratory complications will improve Outcome: Progressing   Problem: Activity: Goal: Risk for activity intolerance will decrease Outcome: Progressing   Problem: Nutrition: Goal: Adequate nutrition will be maintained Outcome: Progressing   Problem: Coping: Goal: Level of anxiety will decrease Outcome: Progressing   Problem: Elimination: Goal: Will not experience complications related to bowel motility Outcome: Progressing Goal: Will not experience complications related to urinary retention Outcome: Progressing   Problem: Pain Managment: Goal: General experience of comfort will improve and/or be controlled Outcome: Progressing   Problem: Safety: Goal: Ability to remain free from injury will improve Outcome: Progressing   Problem: Skin Integrity: Goal: Risk for impaired skin integrity will decrease Outcome: Progressing   Problem: Education: Goal: Ability to describe self-care measures that may prevent or decrease complications (Diabetes Survival Skills Education) will improve Outcome: Progressing Goal: Individualized Educational Video(s) Outcome: Progressing   Problem: Coping: Goal: Ability to adjust to condition or change in health will improve Outcome: Progressing   Problem: Fluid Volume: Goal: Ability to maintain a balanced intake and output will  improve Outcome: Progressing   Problem: Health Behavior/Discharge Planning: Goal: Ability to identify and utilize available resources and services will improve Outcome: Progressing Goal: Ability to manage health-related needs will improve Outcome: Progressing   Problem: Metabolic: Goal: Ability to maintain appropriate glucose levels will improve Outcome: Progressing   Problem: Nutritional: Goal: Maintenance of adequate nutrition will improve Outcome: Progressing Goal: Progress toward achieving an optimal weight will improve Outcome: Progressing   Problem: Skin Integrity: Goal: Risk for impaired skin integrity will decrease Outcome: Progressing   Problem: Tissue Perfusion: Goal: Adequacy of tissue perfusion will improve Outcome: Progressing   Problem: Activity: Goal: Ability to tolerate increased activity will improve Outcome: Progressing   Problem: Respiratory: Goal: Ability to maintain a clear airway and adequate ventilation will improve Outcome: Progressing   Problem: Role Relationship: Goal: Method of communication will improve Outcome: Progressing   Problem: Education: Goal: Understanding of CV disease, CV risk reduction, and recovery process will improve Outcome: Progressing Goal: Individualized Educational Video(s) Outcome: Progressing   Problem: Activity: Goal: Ability to return to baseline activity level will improve Outcome: Progressing   Problem: Cardiovascular: Goal: Ability to achieve and maintain adequate cardiovascular perfusion will improve Outcome: Progressing Goal: Vascular access site(s) Level 0-1 will be maintained Outcome: Progressing   Problem: Health Behavior/Discharge Planning: Goal: Ability to safely manage health-related needs after discharge will improve Outcome: Progressing

## 2023-05-03 NOTE — Progress Notes (Addendum)
 Progress Note   Patient: Beth [name] WGN:[id number] DOB: Aug 01, 1945 DOA: 04/25/2023     8 DOS: the patient was seen and examined on 05/03/2023   Brief hospital course: 78 y.o. female with medical history significant for CVA with left-sided residual weakness, depression, anxiety, neuropathy, insomnia, atrial fibrillation on Eliquis, COPD on home O2 at 3 L who was brought to the ED after she tried to stab herself with scissors.  She endorses feeling suicidal.  Patient recently recovered from a bout of pneumonia, completing antibiotic treatment.  She did endorse diarrhea in the few days prior.  She has had very poor oral intake. ED course and data review: BP slightly elevated on arrival at 170/72, pulse in the 50s otherwise normal vitals Labs notable for potassium of 2.2 and magnesium 1.6.  TSH slightly elevated at 5.528.  Free T4 also elevated at 1.66 Urinalysis with moderate leukocytes CBC WNL, BMP except for potassium WNL. UDS, salicylate, EtOH and acetaminophen levels within normal parameters EKG, personally reviewed and interpreted showing sinus bradycardia at 51 and prolonged QT to 648   Patient was started on ceftriaxone for possibility of UTI.  Also given oral and IV potassium repletion.   Seen by behavioral health,    Hospitalist consulted for admission.   3/28.  Potassium still low this morning IV and oral potassium ordered.  3/29.  Psychiatry changed her medications over to Remeron at night secondary to prolonged QT.  Electrolytes much improved.  Patient felt short of breath this morning.  Chest x-ray showing overall improvement in bilateral airspace opacities from prior studies.  Patient had worsening shortness of breath and was acidotic and sent to the ICU for BiPAP and started on aggressive antibiotics and steroids.  3/30.  Patient did not like the BiPAP mask.  Placed back on nasal cannula.  On IV fluids.  Continue Zosyn.  Discontinue vancomycin with MRSA PCR being negative.   Continue Solu-Medrol.  3/31 -- pt acutely decompensated with flash pulmonary edema, in addition to SVT/VT followed by A-fib with RVR.  Pt had to emergently intubated to transferred to ICU.  In ICU, Cardiology was consulted.  Patient was aggressively diuresed and placed on IV amiodarone. Tested + for non-Covid coronoavirus OC43. Extubated 4/1.  4/2 -- TRH resumed care, pt extubated yesterday.   RHC cath today, see report and Cardiology recommendations.   Assessment and Plan:  Acute on chronic respiratory failure with hypoxia and hypercapnia  Required BiPAP >> emergent intubation on 3/31, extubated 4/1 after aggressive diuresis for flash pulmonary edema --Mgmt of underlying conditions as below --O2 per protocol, wean as tolerated --BiPAP PRN --Pulmonary hygiene   4/5 -- repeat chest xray and BNP for evaluation of dyspnea  Septic shock - resolved off pressors Multifocal pneumonia, tachypnea, leukocytosis and elevated lactic acid.  Given IV fluids per sepsis on admission. COPD with acute exacerbation Hx of Asthma/COPD --Continue IV antibiotics - Zosyn to complete 7 days --IV steroids >> Prednisone 40 mg daily this AM --Bronchodilators --Mucolytics and other supportive care per orders   Flash Pulmonary Edema - required intubation on 3/31, aggressively diuresed, extubated 4/1. Acute on Chronic diastolic congestive heart failure  SVT  / V Tach A-Fib with RVR Demand Ischemia Hx of Paroxysmal A-fib --Cardiology following - see ercs --Off amio drip 4/3 --Amiodarone 200 mg PO BID x 10 days, then 200 mg daily --Eliquis for stroke risk reduction --Lipitor --Metoprolol was stopped --Daily weights & strict Io's --IV diuresis has been stopped, per cardiology --On Lasix  40 mg PO daily --Monitor renal function, electrolytes  --RHC cath 4/2 - see report    Atrial fibrillation with slow ventricular response (HCC) --Management per Cardiology --Continue Eliquis --Metoprolol was  stopped, now resumed --Transitioned amiodarone drip >> PO yesterday, 200 mg BID x 10 days, then 200 mg daily --Telemetry --Maintain K>4, Mg>2    Suicidal ideations - POA Depression and Anxiety Psychiatry consulted 3/28 - recommended inpatient psych admission when medically cleared.  IVC was only recommended in the event pt attempted to leave the hospital, but appears patient is still under IVC. --One-to-one observation.   --Psychiatry to re-assess this afternoon --On Buspar 10 mg BID, Prozac 20 mg daily, Trazodone at bedtime --Remeron 7.5 mg at bedtime was added by psychiatry   Metabolic acidosis - resolved --Monitor BMP   Hypophosphatemia Replaced   Hypomagnesemia Replaced   Hypokalemia Replaced   UTI (urinary tract infection) Urine culture only 20k colonies E. Coli   Prolonged QT interval Avoid QT prolonging drugs Serial ekg's Maintain K>4, Mg>2 Telemetry  Diarrhea Stool studies negative   Other specified hypothyroidism Both TSH and T4 elevated --Continue Synthroid 25 mcg    Pulmonary hypertension  Control blood pressure. O2 per protocol. Monitor hemodynamics closely.  Failure to thrive Poor PO intake --Palliative care consulted for GOC and code status discussions, appreciate assistance      Subjective: Pt seen this AM awaking resting in bed. Reports some shortness of breath, no chest pain or other complaints.     Physical Exam: Vitals:   05/03/23 0500 05/03/23 0753 05/03/23 0806 05/03/23 1150  BP:  (!) 128/55  (!) 125/55  Pulse:  (!) 53  (!) 58  Resp:      Temp:  98 F (36.7 C)  (!) 97.5 F (36.4 C)  TempSrc:    Oral  SpO2:  94% 95% 94%  Weight: 58.7 kg     Height:       General exam: awake, drowsy appearing, no acute distress, frail and chronically ill appearing Respiratory system: coarse referred upper airway secretion sounds otherwise lungs sound clear, no wheezes, using accessory muscles Cardiovascular system: normal S1/S2, RRR, no  pedal edema.   Gastrointestinal system: soft, NT, ND Central nervous system: A&O x 3. no gross focal neurologic deficits, normal speech Extremities: moves all, no edema, normal tone Skin: dry, intact, normal temperature Psychiatry: depressed mood, congruent affect  Data Reviewed:  Notable labs --  K 3.4 Ca 8.5 Hbg 8.4 from 9.1 WBC 9 >> 11.6 BNP 992.2 (down from prior)  Family Communication: Updated son [name] by phone afternoon 4/4.   Disposition: Status is: Inpatient Remains inpatient appropriate because: needs SNF placement.   Planned Discharge Destination: Skilled nursing facility    Time spent: 38 minutes  Author: Pennie Banter, DO 05/03/2023 12:48 PM  For on call review [email or web address].

## 2023-05-03 NOTE — Progress Notes (Signed)
 Speech Language Pathology Treatment: Dysphagia  Patient Details Name: Beth [name] MRN: [id number] DOB: [birth year] Today's Date: 05/03/2023 Time: 1355-1415 SLP Time Calculation (min) (ACUTE ONLY): 20 min  Assessment / Plan / Recommendation Clinical Impression  Pt seen for skilled ST intervention, targeting education for aspiration precautions and respiratory support for breathing and completion of PO trials. Slight elevated in WBC noted from last session. Interval chest imaging revealing, "Stable lung volumes. Ongoing coarse bilateral pulmonary interstitial opacity and lung base hypo ventilation with suspicion of small pleural effusion(s). But ventilation improved since 04/28/2023."  Pt with increased WOB/SOB (labored) while at rest in bed. Pt resistant to all PO trials, reporting no desire for PO intake despite provision of options/education. Pt completing trials of thin liquid via cup (with pt holding cup independently) with overt s/sx of aspiration. Education provided for need to slow, "calm" breathing during PO intake and taking rest breaks for adequate respiratory support. Pt reporting understanding.  Suspect that mech soft remains appropriate for energy conservation and ease of PO intake and thin liquids. Facilitate pt holding cup for increased engagement/attention. Recommend general aspiration precautions including SMALL SIPS SLOWLY; reduced distractions and talking w/ oral intake; Rest Breaks for conservation of energy. Sit fully upright for oral intake. Sign in room with list of precautions.   SLP will continue to follow.    HPI HPI: Pt is a 78 y.o. female with medical history significant for CVA with left-sided residual weakness, depression, anxiety, neuropathy, insomnia, C5-C7 ACDF, atrial fibrillation on Eliquis, COPD on home O2 at 3 L who was brought to the ED after she tried to stab herself with scissors.  She endorses feeling suicidal.  Patient recently dx's w/ a bout of pneumonia  for ~1 month (at home), completing antibiotic treatment.  She did endorse diarrhea in the few days prior.  She has had very poor oral intake.  Low potassium.  Was tachycardia and troponins found to be elevated this AM as well. Cardiology was consulted. Converting in and out of atrial fibrillation/sinus rhythm.   CXR at admit 3/29: Overall improvement in bilateral airspace opacities compared with  prior studies from August. Patchy pulmonary opacities bilaterally  which could reflect postinflammatory scarring or recurrent  infection. No consolidation or pleural effusion.     CXR 4/2: Satisfactory endotracheal and enteric tubes.  2. Mildly improved bilateral lung volumes and ventilation since  yesterday. Ongoing widespread coarse interstitial and more confluent  bilateral lung base opacity.  OF NOTE: Patient's respiratory status decompensated this morning that required intubation 04/28/23-04/29/23 and had VT in 170-180s. Patient received 2x bolus of 150 mg Amio and IV 2g Mg, converted to NSR rate in 70s.  Pt extubated successfully and has been maintained on [place] O2 since.      SLP Plan         Recommendations for follow up therapy are one component of a multi-disciplinary discharge planning process, led by the attending physician.  Recommendations may be updated based on patient status, additional functional criteria and insurance authorization.    Recommendations  Diet recommendations: Dysphagia 3 (mechanical soft);Thin liquid Liquids provided via: Cup;No straw Medication Administration: Crushed with puree (vs whole) Supervision: Patient able to self feed;Staff to assist with self feeding;Full supervision/cueing for compensatory strategies Compensations: Minimize environmental distractions;Slow rate;Small sips/bites;Lingual sweep for clearance of pocketing;Follow solids with liquid Postural Changes and/or Swallow Maneuvers: Out of bed for meals;Seated upright 90 degrees;Upright 30-60 min after meal  Oral care BID;Staff/trained caregiver to provide oral care   Frequent or constant Supervision/Assistance Dysphagia, unspecified (R13.10)          [place] Nashaun Hillmer Clapp, MS, CCC-SLP Speech Language Pathologist Rehab Services; [facility] [phone] (ascom)   [place] J Clapp  05/03/2023, 2:19 PM

## 2023-05-03 NOTE — Consult Note (Signed)
 [facility] Consult Follow up  Patient Name: .Beth [name]  MRN: [id number]  DOB: [birth year]  Consult Order details:  Orders (From admission, onward)     Start     Ordered   04/25/23 0102  IP CONSULT TO PSYCHIATRY       Ordering Provider: Pilar Jarvis, MD  Provider:  (Not yet assigned)  Question Answer Comment  Place call to: ED   Reason for Consult Admit      04/25/23 0101             Mode of Visit: In person    Psychiatry Consult Evaluation  Service Date: May 03, 2023 LOS:  LOS: 8 days  Chief Complaint "I didn't want to live anymore"  Primary Psychiatric Diagnoses  MDD 2.  GAD   Assessment  Beth [name] is a 78 y.o. female admitted: Medicallyfor 04/25/2023 12:02 AM had initially presented to the ED with chief complaints of suicide attempt by attempting to stab herself with scissors, reports of diarrhea starting a few days prior to presentation, and found to be hypokalemic, low magnesium, possible UTI, prolonged QTc. She carries the psychiatric diagnoses of depression, anxiety and has a past medical history of CVA with left-sided residual weakness, neuropathy, insomnia, atrial fibrillation on Eliquis, COPD on home O2 at 3 L, HTN, CHF . Psychiatry was consulted to evaluate for safety and treat.    05/03/2023-patient was seen at the bedside.  Patient reports today that she was feeling more depressed as her son Mr. [name] does not visit her today.  Patient continues to denies any current suicidal thoughts or intentions or plan.  But reports she is still depressed.  Patient reports that she does not want to go to nursing home and does not like nursing home care.  Reports that she has been struggling with depression for a long time but recently felt quite depressed that to the point she tried to harm herself 3 weeks ago by trying to stab herself.  Patient reports currently she does not want to stab herself but still feels depressed.  RN reports that the patient had  been lately more anxious and have been not eating well.  Patient and the family have switched her to DNR status from full code.  Patient had been sleeping okay.  No agitation or aggression noted.    Collateral from the patient's ex-husband Mr. Jana Hakim reports that the patient has been struggling with depression for quite long time.  Patient has been living alone in apartment complex but had an ankle fracture leading to then sending her to the nursing home.  Patient dog also died adding to further stress on her.    Diagnoses:  Active Hospital problems: Principal Problem:   Acute on chronic respiratory failure with hypoxia and hypercapnia (HCC) Active Problems:   Pulmonary hypertension (HCC)   Chronic diastolic congestive heart failure (HCC)   Depression with suicidal ideation   Asthma with COPD (HCC)   Hypokalemia   Prolonged QT interval   UTI (urinary tract infection)   Atrial fibrillation with slow ventricular response (HCC)   Other specified hypothyroidism   Hypomagnesemia   Suicidal ideation   Diarrhea   Hypophosphatemia   Septic shock (HCC)   Metabolic acidosis   Multifocal pneumonia   Precordial chest pain   Respiratory distress   Protein-calorie malnutrition, severe   Decompensated heart failure (HCC)   MDD (major depressive disorder), recurrent episode (HCC)   Suicidal ideations    Plan   ##  Psychiatric Medication Recommendations:  Discontinue Prozac.  Will start patient on Effexor 37.5 mg daily plan to Titrate to manage her outpatient depression and anxiety.  Will continue patient on Remeron 7.5 mg for depression and mood and appetite.   Increase Buspar 15 mg BID for anxiety.  Continue Lamictal  25 mg daily for mood stabilization Remeron 7.5 mg HS for depression/sleep/appetite Discontinue trazodone we will continue to monitor QTc carefully.  At this time patient does not meet the criteria for IVC but would benefit from voluntary inpatient admission.  will  continue to follow and reassess for need to admit to inpatient [facility] unit   ## Medical Decision Making Capacity: Not specifically addressed in this encounter  ## Further Work-up:  --  EKG  EKG on 04/29/23 Qtc 368 -- Pertinent labwork reviewed earlier this admission includes: CBC, electrolytes repleted WNL    ## Disposition:--  Patient does not meet the criteria for IVC.  Patient agreed for voluntary admission to behavioral health.  At this time patient will benefit from inpatient behavioral health admission on a voluntary basis.  ## Behavioral / Environmental: - No specific recommendations at this time.     ## Safety and Observation Level:  - Based on my clinical evaluation, I estimate the patient to be at low  risk of self harm in the current setting. - At this time, we recommend routine observation this decision is based on my review of the chart including patient's history and current presentation, interview of the patient, mental status examination, and consideration of suicide risk including evaluating suicidal ideation, plan, intent, suicidal or self-harm behaviors, risk factors, and protective factors. This judgment is based on our ability to directly address suicide risk, implement suicide prevention strategies, and develop a safety plan while the patient is in the clinical setting. Please contact our team if there is a concern that risk level has changed.  CSSR Risk Category:C-SSRS RISK CATEGORY: Low  Suicide Risk Assessment: Patient has following modifiable risk factors for suicide: Medical problems by getting the treatment and support in the community Patient has following non-modifiable or demographic risk factors for suicide: history of suicide attempt and psychiatric hospitalization Patient has the following protective factors against suicide: Access to outpatient mental health care and Supportive family  Thank you for this consult request. Recommendations have been communicated  to the primary team.  We will continue to follow pt as needed at this time.   Sanjuana Mae, MD       History of Present Illness  Relevant Aspects of [facility] Course:  Admitted on 04/25/2023 for chief complaints of suicide attempt by attempting to stab herself with scissors, reports of diarrhea starting a few days prior to presentation, and found to be hypokalemic, low magnesium, possible UTI, prolonged Qtc.  Patient was started on ceftriaxone for possible UTI and urine culture ordered, oral and IV repletion of potassium, oral repletion of magnesium, was seen in the emergency department by psychiatry and started on Prozac and Zyprexa.  TSH and T4 were found to be elevated, continued on Synthroid 25 mcg daily.  Per history of chronic diastolic congestive heart failure, Lasix was held as well as metoprolol due to bradycardia.     Psychiatric and Social History  Psychiatric History:  Information collected from pt  Prev Dx/Sx: depression, anxiety  Current Psych Provider: provider via nursing facility Home Meds (current): per chart review: Buspar 10 mg BID, Prozac 10 mg daily, lamotrigine 12.5 mg daily, Trazodone 50 mg HS Previous  Med Trials: pt reports Klonopin, remeron Therapy: denies current   Prior Psych Hospitalization: reports multiple  Prior Self Harm: pt denies  Prior Violence: pt denies   Family Psych History: parents - depression, son - depression (per chart review, bipolar)/ reports that her  Family Hx suicide: pt reports maternal and paternal cousin   Social History:  Developmental Hx: normal Educational Hx: some college 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  Living Situation: nursing home, [facility] x6 months  Spiritual Hx: Christian  Access to weapons/lethal means: denies    Substance History Alcohol: denies  Type of alcohol  denies Last Drink denies Number of drinks per day denies History of alcohol withdrawal seizures denies History  of DT's denies Tobacco: yes Illicit drugs: denies Prescription drug abuse: denies Rehab hx: denies  Exam Findings   Vital Signs:  Temp:  [97.5 F (36.4 C)-98.4 F (36.9 C)] 97.5 F (36.4 C) (04/05 1150) Pulse Rate:  [50-120] 63 (04/05 1628) Resp:  [18-22] 20 (04/05 0423) BP: (103-135)/(43-58) 135/58 (04/05 1628) SpO2:  [94 %-98 %] 95 % (04/05 1628) Weight:  [58.7 kg] 58.7 kg (04/05 0500) Blood pressure (!) 135/58, pulse 63, temperature (!) 97.5 F (36.4 C), temperature source Oral, resp. rate 20, height 5\' 5"  (1.651 m), weight 58.7 kg, SpO2 95%. Body mass index is 21.53 kg/m.   Mental Status Exam: General Appearance: Disheveled  Orientation:  Full (Time, Place, and Person)  Memory:  Immediate;   Fair Recent;   Fair Remote;   Fair  Concentration:  Concentration: Good and Attention Span: Good  Recall:  Fair  Attention  Good  Eye Contact:  Good  Speech:  Normal Rate  Language:  Fair  Volume:  Normal  Mood: "depressed"  Affect:  Congruent  Thought Process:  Coherent and Linear  Thought Content:  WDL  Suicidal Thoughts:  No  Homicidal Thoughts:  No  Judgement:  Fair  Insight:  Fair  Psychomotor Activity:  Normal  Akathisia:  NA  Fund of Knowledge:  Fair      Assets:  Architect Housing  Cognition:  WNL  ADL's:  Impaired  AIMS (if indicated):        Other History   These have been pulled in through the EMR, reviewed, and updated if appropriate.  Family History:  The patient's family history includes Alcohol abuse in her father; Anxiety disorder in her mother; Bipolar disorder in her son; Breast cancer (age of onset: 56) in her mother; Cancer in her father; Depression in her father and mother; Gallbladder disease in her father.  Medical History: Past Medical History:  Diagnosis Date   Allergy    Anxiety    Asthma    Chronic pain syndrome    discharged from pain clinic, hx of narcotics seeking behavior   COPD  (chronic obstructive pulmonary disease) (HCC)    CVA (cerebral infarction)    Depression    Fibromyalgia    Headache    Hyperlipidemia    Hypertension    IBS (irritable bowel syndrome)    Stroke (HCC)    Vitamin D deficiency     Surgical History: Past Surgical History:  Procedure Laterality Date   ABDOMINAL HYSTERECTOMY     APPENDECTOMY     BREAST EXCISIONAL BIOPSY  2011   Pt states lump removed, ? side, no scar seen   BREAST SURGERY  2011   biopsy   CERVICAL DISCECTOMY     CHOLECYSTECTOMY     RIGHT HEART  CATH N/A 04/30/2023   Procedure: RIGHT HEART CATH;  Surgeon: Marcina Millard, MD;  Location: [facility] INVASIVE CV LAB;  Service: Cardiovascular;  Laterality: N/A;   SINUSOTOMY       Medications:   Current Facility-Administered Medications:    acetaminophen (TYLENOL) tablet 650 mg, 650 mg, Oral, Q6H PRN **OR** acetaminophen (TYLENOL) suppository 650 mg, 650 mg, Rectal, Q6H PRN, Andris Baumann, MD   acetaminophen (TYLENOL) tablet 650 mg, 650 mg, Oral, Q4H PRN, Paraschos, Alexander, MD, 650 mg at 05/01/23 1507   amiodarone (PACERONE) tablet 200 mg, 200 mg, Oral, BID, 200 mg at 05/03/23 0936 **FOLLOWED BY** [START ON 05/11/2023] amiodarone (PACERONE) tablet 200 mg, 200 mg, Oral, Daily, Hudson, Caralyn, PA-C   apixaban (ELIQUIS) tablet 5 mg, 5 mg, Oral, BID, Netta Neat, RPH, 5 mg at 05/03/23 0865   atorvastatin (LIPITOR) tablet 40 mg, 40 mg, Oral, QHS, Netta Neat, RPH, 40 mg at 05/02/23 2202   busPIRone (BUSPAR) tablet 15 mg, 15 mg, Oral, BID, Chalmers Guest, Khira Cudmore, MD   Chlorhexidine Gluconate Cloth 2 % PADS 6 each, 6 each, Topical, Daily, Alford Highland, MD, 6 each at 05/03/23 0941   chlorpheniramine-HYDROcodone (TUSSIONEX) 10-8 MG/5ML suspension 5 mL, 5 mL, Oral, Q12H PRN, Mansy, Jan A, MD, 5 mL at 05/01/23 2132   docusate sodium (COLACE) capsule 100 mg, 100 mg, Oral, BID, Esaw Grandchild A, DO, 100 mg at 05/03/23 0936   feeding supplement (ENSURE ENLIVE / ENSURE  PLUS) liquid 237 mL, 237 mL, Oral, TID BM, Griffith, Kelly A, DO, 237 mL at 05/03/23 1352   fentaNYL (SUBLIMAZE) bolus via infusion 25-100 mcg, 25-100 mcg, Intravenous, Q15 min PRN, Tukov-Yual, Magdalene S, NP, 100 mcg at 04/29/23 0423   fentaNYL (SUBLIMAZE) injection 25 mcg, 25 mcg, Intravenous, Once, Tukov-Yual, Magdalene S, NP   fluticasone furoate-vilanterol (BREO ELLIPTA) 200-25 MCG/ACT 1 puff, 1 puff, Inhalation, Daily, 1 puff at 05/03/23 0942 **AND** umeclidinium bromide (INCRUSE ELLIPTA) 62.5 MCG/ACT 1 puff, 1 puff, Inhalation, Daily, Lindajo Royal V, MD, 1 puff at 05/03/23 0942   furosemide (LASIX) tablet 40 mg, 40 mg, Oral, Daily, Hudson, Caralyn, PA-C, 40 mg at 05/03/23 0947   gabapentin (NEURONTIN) capsule 100 mg, 100 mg, Oral, TID, Esaw Grandchild A, DO, 100 mg at 05/03/23 1706   guaiFENesin (MUCINEX) 12 hr tablet 600 mg, 600 mg, Oral, Q12H, Esaw Grandchild A, DO, 600 mg at 05/03/23 0936   HYDROcodone-acetaminophen (NORCO/VICODIN) 5-325 MG per tablet 1-2 tablet, 1-2 tablet, Oral, Q4H PRN, Andris Baumann, MD, 2 tablet at 04/29/23 2155   insulin aspart (novoLOG) injection 0-9 Units, 0-9 Units, Subcutaneous, Q4H, Manuela Schwartz, NP, 2 Units at 05/03/23 1707   ipratropium-albuterol (DUONEB) 0.5-2.5 (3) MG/3ML nebulizer solution 3 mL, 3 mL, Nebulization, Q4H PRN, Manuela Schwartz, NP, 3 mL at 05/03/23 0800   lamoTRIgine (LAMICTAL) tablet 25 mg, 25 mg, Oral, Daily, Verner Chol, MD, 25 mg at 05/03/23 7846   levothyroxine (SYNTHROID) tablet 12.5 mcg, 12.5 mcg, Oral, Q0600, Netta Neat, RPH, 12.5 mcg at 05/03/23 9629   LORazepam (ATIVAN) tablet 0.5 mg, 0.5 mg, Oral, Q8H PRN, Esaw Grandchild A, DO, 0.5 mg at 05/03/23 1411   melatonin tablet 5 mg, 5 mg, Oral, QHS, Netta Neat, RPH, 5 mg at 05/02/23 2202   metoprolol tartrate (LOPRESSOR) injection 5 mg, 5 mg, Intravenous, Q2H PRN, Hudson, Caralyn, PA-C   metoprolol tartrate (LOPRESSOR) tablet 25 mg, 25 mg, Oral, BID, Paraschos,  Alexander, MD, 25 mg at 05/03/23 0935   mirtazapine (REMERON) tablet 7.5  mg, 7.5 mg, Oral, QHS, Netta Neat, RPH, 7.5 mg at 05/02/23 1954   montelukast (SINGULAIR) tablet 10 mg, 10 mg, Oral, QHS, Esaw Grandchild A, DO, 10 mg at 05/02/23 2203   morphine (PF) 2 MG/ML injection 2 mg, 2 mg, Intravenous, Q2H PRN, Andris Baumann, MD   multivitamin with minerals tablet 1 tablet, 1 tablet, Oral, Daily, Pennie Banter, DO, 1 tablet at 05/03/23 0935   nitroGLYCERIN (NITROSTAT) SL tablet 0.4 mg, 0.4 mg, Sublingual, Q5 min PRN, Andris Baumann, MD   ondansetron [facility]) injection 4 mg, 4 mg, Intravenous, Once, Manuela Schwartz, NP   Oral care mouth rinse, 15 mL, Mouth Rinse, PRN, Raechel Chute, MD   pantoprazole (PROTONIX) injection 40 mg, 40 mg, Intravenous, QHS, Lowella Bandy, RPH, 40 mg at 05/02/23 2202   piperacillin-tazobactam (ZOSYN) IVPB 3.375 g, 3.375 g, Intravenous, Q8H, Esaw Grandchild A, DO, Last Rate: 12.5 mL/hr at 05/03/23 1708, 3.375 g at 05/03/23 1708   polyethylene glycol (MIRALAX / GLYCOLAX) packet 17 g, 17 g, Oral, Daily, Esaw Grandchild A, DO   potassium chloride SA (KLOR-CON M) CR tablet 40 mEq, 40 mEq, Oral, BID, Esaw Grandchild A, DO, 40 mEq at 05/03/23 0935   predniSONE (DELTASONE) tablet 40 mg, 40 mg, Oral, Q breakfast, Esaw Grandchild A, DO, 40 mg at 05/03/23 0936   sodium chloride flush (NS) 0.9 % injection 3 mL, 3 mL, Intravenous, Q12H, Paraschos, Alexander, MD, 3 mL at 05/03/23 0943   sodium chloride flush (NS) 0.9 % injection 3 mL, 3 mL, Intravenous, PRN, Paraschos, Alexander, MD   Melene Muller ON 05/04/2023] venlafaxine XR (EFFEXOR-XR) 24 hr capsule 37.5 mg, 37.5 mg, Oral, Q breakfast, Sanjuana Mae, MD  Allergies: Allergies  Allergen Reactions   Bextra  [Valdecoxib]    Compazine [Prochlorperazine Edisylate]     Stroke-like symptoms   Lithium Carbonate     Leg weakness   Lyrica [Pregabalin]     Sanjuana Mae, MD

## 2023-05-03 NOTE — Progress Notes (Signed)
 [facility]  SUBJECTIVE: Patient laying in bed, experiencing shortness of breath   Vitals:   05/03/23 0423 05/03/23 0500 05/03/23 0753 05/03/23 0806  BP: (!) 110/45  (!) 128/55   Pulse: (!) 50  (!) 53   Resp: 20     Temp: 98 F (36.7 C)  98 F (36.7 C)   TempSrc: Oral     SpO2: 98%  94% 95%  Weight:  58.7 kg    Height:         Intake/Output Summary (Last 24 hours) at 05/03/2023 1610 Last data filed at 05/03/2023 0354 Gross per 24 hour  Intake 200 ml  Output 530 ml  Net -330 ml      PHYSICAL EXAM  General: Well developed, well nourished, in no acute distress HEENT:  Normocephalic and atramatic Neck:  No JVD.  Lungs: Clear bilaterally to auscultation and percussion. Heart: HRRR . Normal S1 and S2 without gallops or murmurs.  Abdomen: Bowel sounds are positive, abdomen soft and non-tender  Msk:  Back normal, normal gait. Normal strength and tone for age. Extremities: No clubbing, cyanosis or edema.   Neuro: Alert and oriented X 3. Psych:  Good affect, responds appropriately   LABS: Basic Metabolic Panel: Recent Labs    05/02/23 0419 05/03/23 0610  NA 142 142  K 4.2 3.4*  CL 106 105  CO2 29 30  GLUCOSE 143* 91  BUN 23 21  CREATININE 0.87 0.89  CALCIUM 8.7* 8.5*  MG 2.1 2.2  PHOS 2.9 3.2   Liver Function Tests: Recent Labs    05/02/23 0419 05/03/23 0610  ALBUMIN 2.3* 2.1*   No results for input(s): "LIPASE", "AMYLASE" in the last 72 hours. CBC: Recent Labs    05/02/23 0419 05/03/23 0610  WBC 9.0 11.6*  HGB 9.1* 8.4*  HCT 27.9* 26.3*  MCV 93.0 92.3  PLT 163 180   Cardiac Enzymes: No results for input(s): "CKTOTAL", "CKMB", "CKMBINDEX", "TROPONINI" in the last 72 hours. BNP: Invalid input(s): "POCBNP" D-Dimer: No results for input(s): "DDIMER" in the last 72 hours. Hemoglobin A1C: No results for input(s): "HGBA1C" in the last 72 hours. Fasting Lipid Panel: No results for input(s): "CHOL", "HDL", "LDLCALC", "TRIG", "CHOLHDL", "LDLDIRECT"  in the last 72 hours. Thyroid Function Tests: No results for input(s): "TSH", "T4TOTAL", "T3FREE", "THYROIDAB" in the last 72 hours.  Invalid input(s): "FREET3" Anemia Panel: No results for input(s): "VITAMINB12", "FOLATE", "FERRITIN", "TIBC", "IRON", "RETICCTPCT" in the last 72 hours.  No results found.   Echo EF 55-60% 04/28/2023  TELEMETRY: Sinus bradycardia 49 bpm:  ASSESSMENT AND PLAN:  Principal Problem:   Acute on chronic respiratory failure with hypoxia and hypercapnia (HCC) Active Problems:   Pulmonary hypertension (HCC)   Chronic diastolic congestive heart failure (HCC)   Depression with suicidal ideation   Asthma with COPD (HCC)   Hypokalemia   Prolonged QT interval   UTI (urinary tract infection)   Atrial fibrillation with slow ventricular response (HCC)   Other specified hypothyroidism   Hypomagnesemia   Suicidal ideation   Diarrhea   Hypophosphatemia   Septic shock (HCC)   Metabolic acidosis   Multifocal pneumonia   Precordial chest pain   Respiratory distress   Protein-calorie malnutrition, severe   Decompensated heart failure (HCC)   MDD (major depressive disorder), recurrent episode (HCC)   Suicidal ideations    1.  Paroxysmal atrial fibrillation, converted to sinus bradycardia, on Eliquis for stroke prevention, and amiodarone (200 mg twice daily) and metoprolol tartrate (25 mg p.o.  twice daily), for rate and rhythm control 2.  COPD exacerbation / pneumonia, on Zosyn and multiple inhalers, oxygen saturation 95% on 3 L 3.  Mildly elevated troponin, 481, 449, 684, 340, in the absence of chest pain, likely demand supply ischemia secondary to atrial fibrillation with RVR and respiratory failure 4.  Moderate pulmonary hypertension, PA mean 42 mmHg by RHC 04/30/2023  Recommendations  1.  Continue current therapy 2.  Continue Eliquis for stroke prevention 3.  Continue amiodarone load, reduce to 200 mg daily 06/09/2023 4.  Continue metoprolol tartrate 25 mg  twice daily for now, closely monitor heart rate 5.  Diuretics as needed 6.  Defer further cardiac diagnostics at this time   Marcina Millard, MD, PhD, [facility] 05/03/2023 9:22 AM

## 2023-05-03 NOTE — Progress Notes (Signed)
 PHARMACY CONSULT NOTE - FOLLOW UP  Pharmacy Consult for Electrolyte Monitoring and Replacement   Recent Labs: Potassium (mmol/L)  Date Value  05/03/2023 3.4 (L)  12/26/2012 3.3 (L)   Magnesium (mg/dL)  Date Value  16/10/9602 2.2  12/25/2012 1.8   Calcium (mg/dL)  Date Value  54/09/8117 8.5 (L)   Calcium, Total (mg/dL)  Date Value  14/78/2956 9.1   Albumin (g/dL)  Date Value  21/30/8657 2.1 (L)  03/23/2015 3.6  12/20/2012 3.5   Phosphorus (mg/dL)  Date Value  84/69/6295 3.2   Sodium (mmol/L)  Date Value  05/03/2023 142  01/01/2021 143  12/26/2012 144     Assessment: 78 yo F with PMH including CVA, Afib (on Eliquis) presenting with hypokalemia. Per patient, recently experienced diarrhea for a few days due to taking antibiotic for pneumonia. Pharmacy has been consulted to manage electrolytes. On amio.   Diuretics: furosemide 40 mg po once daily  Goal of Therapy:  Potassium 4.0 - 5.1 mmol/L Magnesium 2.0 - 2.4 mg/dL All Other Electrolytes WNL  Plan: Kcl 40 mEq BID x 2.  F/u with AM labs.    Ronnald Ramp, PharmD Clinical Pharmacist 05/03/2023 7:56 AM

## 2023-05-04 ENCOUNTER — Inpatient Hospital Stay

## 2023-05-04 DIAGNOSIS — J9622 Acute and chronic respiratory failure with hypercapnia: Secondary | ICD-10-CM | POA: Diagnosis not present

## 2023-05-04 DIAGNOSIS — Z711 Person with feared health complaint in whom no diagnosis is made: Secondary | ICD-10-CM

## 2023-05-04 DIAGNOSIS — Z7189 Other specified counseling: Secondary | ICD-10-CM | POA: Diagnosis not present

## 2023-05-04 DIAGNOSIS — Z515 Encounter for palliative care: Secondary | ICD-10-CM | POA: Diagnosis not present

## 2023-05-04 DIAGNOSIS — F331 Major depressive disorder, recurrent, moderate: Secondary | ICD-10-CM | POA: Diagnosis not present

## 2023-05-04 DIAGNOSIS — J9621 Acute and chronic respiratory failure with hypoxia: Secondary | ICD-10-CM | POA: Diagnosis not present

## 2023-05-04 DIAGNOSIS — E876 Hypokalemia: Secondary | ICD-10-CM | POA: Diagnosis not present

## 2023-05-04 LAB — CBC
HCT: 30.4 % — ABNORMAL LOW (ref 36.0–46.0)
Hemoglobin: 9.7 g/dL — ABNORMAL LOW (ref 12.0–15.0)
MCH: 29.7 pg (ref 26.0–34.0)
MCHC: 31.9 g/dL (ref 30.0–36.0)
MCV: 93 fL (ref 80.0–100.0)
Platelets: 218 10*3/uL (ref 150–400)
RBC: 3.27 MIL/uL — ABNORMAL LOW (ref 3.87–5.11)
RDW: 17.3 % — ABNORMAL HIGH (ref 11.5–15.5)
WBC: 12.4 10*3/uL — ABNORMAL HIGH (ref 4.0–10.5)
nRBC: 0.3 % — ABNORMAL HIGH (ref 0.0–0.2)

## 2023-05-04 LAB — RENAL FUNCTION PANEL
Albumin: 2.4 g/dL — ABNORMAL LOW (ref 3.5–5.0)
Anion gap: 6 (ref 5–15)
BUN: 18 mg/dL (ref 8–23)
CO2: 28 mmol/L (ref 22–32)
Calcium: 9 mg/dL (ref 8.9–10.3)
Chloride: 108 mmol/L (ref 98–111)
Creatinine, Ser: 0.72 mg/dL (ref 0.44–1.00)
GFR, Estimated: >60 mL/min (ref >60)
Glucose, Bld: 101 mg/dL — ABNORMAL HIGH (ref 70–99)
Phosphorus: 3 mg/dL (ref 2.5–4.6)
Potassium: 4.8 mmol/L (ref 3.5–5.1)
Sodium: 142 mmol/L (ref 135–145)

## 2023-05-04 LAB — GLUCOSE, CAPILLARY
Glucose-Capillary: 106 mg/dL — ABNORMAL HIGH (ref 70–99)
Glucose-Capillary: 133 mg/dL — ABNORMAL HIGH (ref 70–99)
Glucose-Capillary: 158 mg/dL — ABNORMAL HIGH (ref 70–99)
Glucose-Capillary: 73 mg/dL (ref 70–99)
Glucose-Capillary: 98 mg/dL (ref 70–99)
Glucose-Capillary: 99 mg/dL (ref 70–99)

## 2023-05-04 LAB — LACTIC ACID, PLASMA: Lactic Acid, Venous: 2.1 mmol/L (ref 0.5–1.9)

## 2023-05-04 LAB — BRAIN NATRIURETIC PEPTIDE: B Natriuretic Peptide: 1414.5 pg/mL — ABNORMAL HIGH (ref 0.0–100.0)

## 2023-05-04 LAB — TROPONIN I (HIGH SENSITIVITY)
Troponin I (High Sensitivity): 29 ng/L — ABNORMAL HIGH
Troponin I (High Sensitivity): 33 ng/L — ABNORMAL HIGH (ref <18)

## 2023-05-04 MED ORDER — FUROSEMIDE 10 MG/ML IJ SOLN
40.0000 mg | Freq: Two times a day (BID) | INTRAMUSCULAR | Status: DC
  Administered 2023-05-04 – 2023-05-05 (×2): 40 mg via INTRAVENOUS
  Filled 2023-05-04 (×2): qty 4

## 2023-05-04 MED ORDER — PREDNISONE 20 MG PO TABS
30.0000 mg | ORAL_TABLET | Freq: Every day | ORAL | Status: DC
  Administered 2023-05-05: 30 mg via ORAL
  Filled 2023-05-04: qty 1

## 2023-05-04 MED ORDER — VENLAFAXINE HCL ER 75 MG PO CP24
75.0000 mg | ORAL_CAPSULE | Freq: Every day | ORAL | Status: DC
  Administered 2023-05-05: 75 mg via ORAL
  Filled 2023-05-04: qty 1

## 2023-05-04 MED ORDER — FUROSEMIDE 10 MG/ML IJ SOLN
INTRAMUSCULAR | Status: AC
  Filled 2023-05-04: qty 4

## 2023-05-04 MED ORDER — FUROSEMIDE 10 MG/ML IJ SOLN
40.0000 mg | Freq: Once | INTRAMUSCULAR | Status: AC
Start: 2023-05-04 — End: 2023-05-04
  Administered 2023-05-04: 40 mg via INTRAVENOUS
  Filled 2023-05-04: qty 4

## 2023-05-04 NOTE — Progress Notes (Signed)
 PHARMACY CONSULT NOTE - FOLLOW UP  Pharmacy Consult for Electrolyte Monitoring and Replacement   Recent Labs: Potassium (mmol/L)  Date Value  05/04/2023 4.8  12/26/2012 3.3 (L)   Magnesium (mg/dL)  Date Value  16/10/9602 2.2  12/25/2012 1.8   Calcium (mg/dL)  Date Value  54/09/8117 9.0   Calcium, Total (mg/dL)  Date Value  14/78/2956 9.1   Albumin (g/dL)  Date Value  21/30/8657 2.4 (L)  03/23/2015 3.6  12/20/2012 3.5   Phosphorus (mg/dL)  Date Value  84/69/6295 3.0   Sodium (mmol/L)  Date Value  05/04/2023 142  01/01/2021 143  12/26/2012 144     Assessment: 78 yo F with PMH including CVA, Afib (on Eliquis) presenting with hypokalemia. Per patient, recently experienced diarrhea for a few days due to taking antibiotic for pneumonia. Pharmacy has been consulted to manage electrolytes. On amio.   Diuretics: furosemide 40 mg po once daily  Goal of Therapy:  Potassium 4.0 - 5.1 mmol/L Magnesium 2.0 - 2.4 mg/dL All Other Electrolytes WNL  Plan: No replacement needed F/u with AM labs.    Ronnald Ramp, PharmD Clinical Pharmacist 05/04/2023 8:46 AM

## 2023-05-04 NOTE — Plan of Care (Signed)

## 2023-05-04 NOTE — Progress Notes (Signed)
 Palliative Care Progress Note, Assessment & Plan   Patient Name: Beth [name]       Date: 05/04/2023 DOB: [birth year]  Age: 78 y.o. MRN#: [id number] Attending Physician: Pennie Banter, DO Primary Care Physician: Smitty Cords, DO Admit Date: 04/25/2023  Subjective: Pt denies pain but endorses difficulty breathing.   HPI: 78 y.o. female with medical history significant for CVA with left-sided residual weakness, depression, anxiety, neuropathy, insomnia, atrial fibrillation on Eliquis, COPD on home O2 at 3 L who was brought to the ED after she tried to stab herself with scissors.  She endorses feeling suicidal.  Patient recently recovered from a bout of pneumonia, completing antibiotic treatment.  She did endorse diarrhea in the few days prior.  She has had very poor oral intake.   ED course and data review: BP slightly elevated on arrival at 170/72, pulse in the 50s otherwise normal vitals Labs notable for potassium of 2.2 and magnesium 1.6.  TSH slightly elevated at 5.528.  Free T4 also elevated at 1.66 Urinalysis with moderate leukocytes CBC WNL, BMP except for potassium WNL. UDS, salicylate, EtOH and acetaminophen levels within normal parameters EKG, personally reviewed and interpreted showing sinus bradycardia at 51 and prolonged QT to 648   Patient was started on ceftriaxone for possibility of UTI.  Also given oral and IV potassium repletion.   Seen by behavioral health.   Hospitalist consulted for admission.    3/28 Potassium still low this morning IV and oral potassium ordered.   3/29 Psychiatry changed her medications over to Remeron at night secondary to prolonged QT.  Electrolytes much improved.  Patient felt short of breath this morning.  Chest x-ray showing overall  improvement in bilateral airspace opacities from prior studies.   Patient had worsening shortness of breath and was acidotic and sent to the ICU for BiPAP and started on aggressive antibiotics and steroids.   3/30 Patient did not like the BiPAP mask.  Placed back on nasal cannula.  On IV fluids.  Continue Zosyn.  Discontinue vancomycin with MRSA PCR being negative.  Continue Solu-Medrol.   3/31 pt acutely decompensated with flash pulmonary edema, in addition to SVT/VT followed by A-fib with RVR.  Pt had to emergently intubated to transferred to ICU.   In ICU, Cardiology was consulted.  Patient was aggressively diuresed and placed on IV amiodarone. Tested + for non-Covid coronoavirus OC43. Extubated 4/1.   4/2 TRH resumed care, pt extubated yesterday.   RHC cath today, see report and Cardiology recommendations.   4/4 IVC rescinded today, PMT re-consulted to discuss goals of care.   4/5 Rapid response called for respiratory distress. Workup revealed:  CBC    Component Value Date/Time   WBC 12.4 (H) 05/04/2023 0404   RBC 3.27 (L) 05/04/2023 0404   HGB 9.7 (L) 05/04/2023 0404   HGB 12.2 12/25/2012 1449   HCT 30.4 (L) 05/04/2023 0404   HCT 36.1 12/25/2012 1449   PLT 218 05/04/2023 0404   PLT 166 12/29/2012 0553   MCV 93.0 05/04/2023 0404   MCV 88 12/25/2012 1449   MCH 29.7 05/04/2023 0404   MCHC 31.9 05/04/2023 0404   RDW 17.3 (H) 05/04/2023 0404  RDW 15.6 (H) 12/25/2012 1449   LYMPHSABS 7.0 (H) 04/26/2023 2222   LYMPHSABS 2.6 12/25/2012 1449   MONOABS 0.5 04/26/2023 2222   MONOABS 0.4 12/25/2012 1449   EOSABS 0.3 04/26/2023 2222   EOSABS 0.1 12/25/2012 1449   BASOSABS 0.0 04/26/2023 2222   BASOSABS 0.1 12/25/2012 1449   Lactic Acid, Venous    Component Value Date/Time   LATICACIDVEN 2.1 (HH) 05/04/2023 1052   CXR:   IMPRESSION: Continued coarse bilateral pulmonary interstitial opacity and increased confluent/veiling bilateral lung base opacity since yesterday.  Consider increasing pleural effusions, atelectasis, infection.   Summary of counseling/coordination of care: Extensive chart review completed prior to meeting patient including labs, vital signs, imaging, progress notes, orders, and available advanced directive documents from current and previous encounters.   After reviewing the patient's chart and assessing the patient at bedside, I spoke with patient/son in regards to symptom management and goals of care after a rapid response was called due to worsening dyspnea.   Elderly, ill-appearing female,respiratory distress and tachypnea with accessory muscle use noted. She is unable to speak due to shortness of breath.  I readdressed conversation that was previously had with patient a few days ago in regards to stopping aggressive treatment and transitioning to comfort care. [name] shares that he and his mother spoke earlier and said she is considering comfort care.  When asking patient if she wants to continue medical treatment, she nods her head yes. The patient again confirmed that she did not want comfort care at this time and to continue all medical treatment. She remains a DNR/DNI.   During visit, [name] at times is tearful. He understands the seriousness of his mother's illness. Son was counseled about his mother's status along with Dr. Denton Lank.   Therapeutic silence and active listening provided for son to share his thoughts and emotions regarding current medical situation.  Emotional support provided.  [name] agrees with his mother's wish to continue the current medical treatment. Advised that PMT will continue to follow up and keep his informed of changes in status.    Physical Exam Vitals reviewed.  Constitutional:      Appearance: She is ill-appearing.  Pulmonary:     Effort: Respiratory distress present.     Comments: Tachypnea, accessory muscle use Musculoskeletal:     Right lower leg: Edema present.     Left lower leg: Edema  present.     Comments: Mild peripheral edema  Skin:    General: Skin is warm and dry.  Neurological:     Mental Status: She is alert and oriented to person, place, and time.  Psychiatric:        Mood and Affect: Mood normal.        Behavior: Behavior normal.             Total Time 50 minutes   Time spent includes: Detailed review of medical records (labs, imaging, vital signs), medically appropriate exam (mental status, respiratory, cardiac, skin), discussed with treatment team, counseling and educating patient, family and staff, documenting clinical information, medication management and coordination of care.     Alex Gardener, [facility] Palliative Medicine Team  05/04/2023 9:18 AM  Office [phone]  Pager [phone]

## 2023-05-04 NOTE — Progress Notes (Signed)
 [facility] Cardiology  SUBJECTIVE: Patient laying in bed, appears dyspneic   Vitals:   05/03/23 2316 05/04/23 0345 05/04/23 0500 05/04/23 0755  BP: 123/67 (!) 143/67  (!) 155/67  Pulse: (!) 50 (!) 50  (!) 59  Resp: 20 19  16   Temp: 97.6 F (36.4 C) 97.7 F (36.5 C)  98.6 F (37 C)  TempSrc: Oral Oral    SpO2: 99% 100%  100%  Weight:   58.2 kg   Height:         Intake/Output Summary (Last 24 hours) at 05/04/2023 0851 Last data filed at 05/03/2023 2000 Gross per 24 hour  Intake 120 ml  Output 450 ml  Net -330 ml      PHYSICAL EXAM  General: Well developed, well nourished, in no acute distress HEENT:  Normocephalic and atramatic Neck:  No JVD.  Lungs: Clear bilaterally to auscultation and percussion. Heart: HRRR . Normal S1 and S2 without gallops or murmurs.  Abdomen: Bowel sounds are positive, abdomen soft and non-tender  Msk:  Back normal, normal gait. Normal strength and tone for age. Extremities: No clubbing, cyanosis or edema.   Neuro: Alert and oriented X 3. Psych:  Good affect, responds appropriately   LABS: Basic Metabolic Panel: Recent Labs    05/02/23 0419 05/03/23 0610 05/04/23 0404  NA 142 142 142  K 4.2 3.4* 4.8  CL 106 105 108  CO2 29 30 28   GLUCOSE 143* 91 101*  BUN 23 21 18   CREATININE 0.87 0.89 0.72  CALCIUM 8.7* 8.5* 9.0  MG 2.1 2.2  --   PHOS 2.9 3.2 3.0   Liver Function Tests: Recent Labs    05/03/23 0610 05/04/23 0404  ALBUMIN 2.1* 2.4*   No results for input(s): "LIPASE", "AMYLASE" in the last 72 hours. CBC: Recent Labs    05/03/23 0610 05/04/23 0404  WBC 11.6* 12.4*  HGB 8.4* 9.7*  HCT 26.3* 30.4*  MCV 92.3 93.0  PLT 180 218   Cardiac Enzymes: No results for input(s): "CKTOTAL", "CKMB", "CKMBINDEX", "TROPONINI" in the last 72 hours. BNP: Invalid input(s): "POCBNP" D-Dimer: No results for input(s): "DDIMER" in the last 72 hours. Hemoglobin A1C: No results for input(s): "HGBA1C" in the last 72 hours. Fasting Lipid  Panel: No results for input(s): "CHOL", "HDL", "LDLCALC", "TRIG", "CHOLHDL", "LDLDIRECT" in the last 72 hours. Thyroid Function Tests: No results for input(s): "TSH", "T4TOTAL", "T3FREE", "THYROIDAB" in the last 72 hours.  Invalid input(s): "FREET3" Anemia Panel: No results for input(s): "VITAMINB12", "FOLATE", "FERRITIN", "TIBC", "IRON", "RETICCTPCT" in the last 72 hours.  DG Chest Port 1 View Result Date: 05/03/2023 CLINICAL DATA:  78 year old female with shortness of breath. EXAM: PORTABLE CHEST 1 VIEW COMPARISON:  Portable chest 04/29/2023 and earlier. FINDINGS: Portable AP upright view at 0925 hours. Extubated and enteric tube removed. Stable lung volumes. Mediastinal contours are within normal limits. Coarse bilateral, fairly symmetric pulmonary interstitial opacity now. Confluent and veiling left lung base opacity, not significantly changed from 04/28/2023. Right lung ventilation mildly improved since that time. No pneumothorax. No convincing pulmonary edema. No acute osseous abnormality identified. Negative visible bowel gas. Cervical ACDF partially visible. IMPRESSION: 1. Extubated and enteric tube removed. 2. Stable lung volumes. Ongoing coarse bilateral pulmonary interstitial opacity and lung base hypo ventilation with suspicion of small pleural effusion(s). But ventilation improved since 04/28/2023. Electronically Signed   By: Odessa Fleming M.D.   On: 05/03/2023 12:33     Echo LVEF 55-60% 03/29/2023  TELEMETRY: Sinus rhythm 65 bpm:  ASSESSMENT  AND PLAN:  Principal Problem:   Acute on chronic respiratory failure with hypoxia and hypercapnia (HCC) Active Problems:   Pulmonary hypertension (HCC)   Chronic diastolic congestive heart failure (HCC)   Depression with suicidal ideation   Asthma with COPD (HCC)   Hypokalemia   Prolonged QT interval   UTI (urinary tract infection)   Atrial fibrillation with slow ventricular response (HCC)   Other specified hypothyroidism   Hypomagnesemia    Suicidal ideation   Diarrhea   Hypophosphatemia   Septic shock (HCC)   Metabolic acidosis   Multifocal pneumonia   Precordial chest pain   Respiratory distress   Protein-calorie malnutrition, severe   Decompensated heart failure (HCC)   MDD (major depressive disorder), recurrent episode (HCC)   Suicidal ideations    1. Paroxysmal atrial fibrillation, converted to sinus bradycardia, on Eliquis for stroke prevention, and amiodarone (200 mg twice daily) and metoprolol tartrate (25 mg p.o. twice daily), for rate and rhythm control 2.  COPD exacerbation / pneumonia, on Zosyn and multiple inhalers, oxygen saturation 95% on 3 L 3.  Mildly elevated troponin, 481, 449, 684, 340, in the absence of chest pain, likely demand supply ischemia secondary to atrial fibrillation with RVR and respiratory failure 4.  Moderate pulmonary hypertension, PA mean 42 mmHg by RHC 04/30/2023   Recommendations   1.  Continue current therapy 2.  Continue Eliquis for stroke prevention 3.  Continue amiodarone load, reduce to 200 mg daily 06/09/2023 4.  Continue metoprolol tartrate 25 mg twice daily for now, closely monitor heart rate 5.  Diuretics as needed 6.  Defer further cardiac diagnostics at this time   Marcina Millard, MD, PhD, [facility] 05/04/2023 8:51 AM

## 2023-05-04 NOTE — Progress Notes (Addendum)
 Hospitalist Critical Care Progress Note  Rapid response was called this morning for patient having increased dyspnea and working of breathing.  Rapid was cancelled as I was on my way to the room.  Arrived to the room to find patient using accessory  muscles to breath, but awake & responsive to questions, following commands appropriately.  Pt had been placed on non-re-breather mask.  Exam Gen: ill-appearing, frail, awake but drowsy appearing CV: RRR, no peripheral edema but significant +JVD Resp: +wheezes and crackles, on NRB mask GI: soft non-tender Neuro: follows commands, grossly non-focal, minimally verbal due to dyspnea Extremities: moves all, no significant peripheral edema   Assessment: Suspect patient is again volume overloaded possibly with recurrent pulmonary edema.  Earlier this admission, pt decompensated and required transfer to ICU for flash pulmonary that required emergent intubation.  This appears to be a recurrence of same.   Plan:  Chest xray BNP Lactic acid Troponin x 2 Lasix 40 mg IV x 1 for now & further IV diuresis based on response and above evaluation Cardiology continues to follow, will update them on change in clinical status Continue to address goals of care   Family communication: son [name] updated at bedside this morning at bedside along with Palliative Care NP present.   CRITICAL CARE Performed by: Pennie Banter   Total critical care time: 35 minutes  Critical care time was exclusive of separately billable procedures and treating other patients.  Critical care was necessary to treat or prevent imminent or life-threatening deterioration.  Critical care was time spent personally by me on the following activities: development of treatment plan with patient and/or surrogate as well as nursing, discussions with consultants, evaluation of patient's response to treatment, examination of patient, obtaining history from patient or surrogate, ordering and  performing treatments and interventions, ordering and review of laboratory studies, ordering and review of radiographic studies, pulse oximetry and re-evaluation of patient's condition.

## 2023-05-04 NOTE — Consult Note (Signed)
 [facility] Consult Follow up  Patient Name: .Beth [name]  MRN: [id number]  DOB: 03/26/1945  Consult Order details:  Orders (From admission, onward)     Start     Ordered   04/25/23 0102  IP CONSULT TO PSYCHIATRY       Ordering Provider: Pilar Jarvis, MD  Provider:  (Not yet assigned)  Question Answer Comment  Place call to: ED   Reason for Consult Admit      04/25/23 0101             Mode of Visit: In person    Psychiatry Consult Evaluation  Service Date: May 04, 2023 LOS:  LOS: 9 days  Chief Complaint "I didn't want to live anymore"  Primary Psychiatric Diagnoses  MDD 2.  GAD   Assessment  Beth [name] is a 78 y.o. female admitted: Medicallyfor 04/25/2023 12:02 AM had initially presented to the ED with chief complaints of suicide attempt by attempting to stab herself with scissors, reports of diarrhea starting a few days prior to presentation, and found to be hypokalemic, low magnesium, possible UTI, prolonged QTc. She carries the psychiatric diagnoses of depression, anxiety and has a past medical history of CVA with left-sided residual weakness, neuropathy, insomnia, atrial fibrillation on Eliquis, COPD on home O2 at 3 L, HTN, CHF . Psychiatry was consulted to evaluate for safety and treat.    05/04/2023-patient was seen later in the afternoon.  Chart reviewed.  Patient has an episode in the morning.  Rapid response was called secondary to increase work of breathing.  Concerns for possible recurrent pulmonary edema requiring diuretics.  Patient was evaluated at the bedside along with her ex-husband in the room.  Patient reports that she is feeling much better today than before.  Denies any suicide homicidal ideations of any auditory visual hallucinations.  Patient reports that her son and daughter visited her today also.  Patient ex-husband who was at her bedside reports that she is much better psychiatrically today and is much more brighter than previous  days.  Patient denies any current side effects of the medication.  Talked with the RN -she reports that the patient is slightly doing better.  He is eating well.  Sleeping well.  No concerns.  Patient is not making any suicidal or homicidal statement.  Overall patient was much brighter than previous days.  05/03/2023-patient was seen at the bedside.  Patient reports today that she was feeling more depressed as her son Mr. [name] does not visit her today.  Patient continues to denies any current suicidal thoughts or intentions or plan.  But reports she is still depressed.  Patient reports that she does not want to go to nursing home and does not like nursing home care.  Reports that she has been struggling with depression for a long time but recently felt quite depressed that to the point she tried to harm herself 3 weeks ago by trying to stab herself.  Patient reports currently she does not want to stab herself but still feels depressed.  RN reports that the patient had been lately more anxious and have been not eating well.  Patient and the family have switched her to DNR status from full code.  Patient had been sleeping okay.  No agitation or aggression noted.    Collateral from the patient's ex-husband Mr. Jana Hakim reports that the patient has been struggling with depression for quite long time.  Patient has been living alone in apartment complex  but had an ankle fracture leading to then sending her to the nursing home.  Patient dog also died adding to further stress on her.    Diagnoses:  Active Hospital problems: Principal Problem:   Acute on chronic respiratory failure with hypoxia and hypercapnia (HCC) Active Problems:   Pulmonary hypertension (HCC)   Chronic diastolic congestive heart failure (HCC)   Depression with suicidal ideation   Asthma with COPD (HCC)   Hypokalemia   Prolonged QT interval   UTI (urinary tract infection)   Atrial fibrillation with slow ventricular response (HCC)    Other specified hypothyroidism   Hypomagnesemia   Suicidal ideation   Diarrhea   Hypophosphatemia   Septic shock (HCC)   Metabolic acidosis   Multifocal pneumonia   Precordial chest pain   Respiratory distress   Protein-calorie malnutrition, severe   Decompensated heart failure (HCC)   MDD (major depressive disorder), recurrent episode (HCC)   Suicidal ideations    Plan   ## Psychiatric Medication Recommendations:  Increase Effexor 75 mg daily plan to Titrate to manage her outpatient depression and anxiety.  Will continue patient on Remeron 7.5 mg for depression and mood and appetite.   continue Buspar 15 mg BID for anxiety.  D/c Lamictal   Remeron 7.5 mg HS for depression/sleep/appetite Discontinue trazodone we will continue to monitor QTc carefully.  At this time patient does not meet the criteria for IVC but would benefit from voluntary inpatient admission.  will continue to follow and reassess for need to admit to inpatient [facility] unit   ## Medical Decision Making Capacity: Not specifically addressed in this encounter  ## Further Work-up:  --  EKG  EKG on 04/29/23 Qtc 368 -- Pertinent labwork reviewed earlier this admission includes: CBC, electrolytes repleted WNL    ## Disposition:--  Patient does not meet the criteria for IVC.  Patient agreed for voluntary admission to behavioral health.  At this time patient will benefit from inpatient behavioral health admission on a voluntary basis.  ## Behavioral / Environmental: - No specific recommendations at this time.     ## Safety and Observation Level:  - Based on my clinical evaluation, I estimate the patient to be at low  risk of self harm in the current setting. - At this time, we recommend routine observation this decision is based on my review of the chart including patient's history and current presentation, interview of the patient, mental status examination, and consideration of suicide risk including evaluating  suicidal ideation, plan, intent, suicidal or self-harm behaviors, risk factors, and protective factors. This judgment is based on our ability to directly address suicide risk, implement suicide prevention strategies, and develop a safety plan while the patient is in the clinical setting. Please contact our team if there is a concern that risk level has changed.  CSSR Risk Category:C-SSRS RISK CATEGORY: Low  Suicide Risk Assessment: Patient has following modifiable risk factors for suicide: Medical problems by getting the treatment and support in the community Patient has following non-modifiable or demographic risk factors for suicide: history of suicide attempt and psychiatric hospitalization Patient has the following protective factors against suicide: Access to outpatient mental health care and Supportive family  Thank you for this consult request. Recommendations have been communicated to the primary team.  We will continue to follow pt as needed at this time.   Sanjuana Mae, MD       History of Present Illness  Relevant Aspects of [facility] Course:  Admitted on 04/25/2023  for chief complaints of suicide attempt by attempting to stab herself with scissors, reports of diarrhea starting a few days prior to presentation, and found to be hypokalemic, low magnesium, possible UTI, prolonged Qtc.  Patient was started on ceftriaxone for possible UTI and urine culture ordered, oral and IV repletion of potassium, oral repletion of magnesium, was seen in the emergency department by psychiatry and started on Prozac and Zyprexa.  TSH and T4 were found to be elevated, continued on Synthroid 25 mcg daily.  Per history of chronic diastolic congestive heart failure, Lasix was held as well as metoprolol due to bradycardia.     Psychiatric and Social History  Psychiatric History:  Information collected from pt  Prev Dx/Sx: depression, anxiety  Current Psych Provider: provider via nursing  facility Home Meds (current): per chart review: Buspar 10 mg BID, Prozac 10 mg daily, lamotrigine 12.5 mg daily, Trazodone 50 mg HS Previous Med Trials: pt reports Klonopin, remeron Therapy: denies current   Prior Psych Hospitalization: reports multiple  Prior Self Harm: pt denies  Prior Violence: pt denies   Family Psych History: parents - depression, son - depression (per chart review, bipolar)/ reports that her  Family Hx suicide: pt reports maternal and paternal cousin   Social History:  Developmental Hx: normal Educational Hx: some college 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  Living Situation: nursing home, [facility] x6 months  Spiritual Hx: Christian  Access to weapons/lethal means: denies    Substance History Alcohol: denies  Type of alcohol  denies Last Drink denies Number of drinks per day denies History of alcohol withdrawal seizures denies History of DT's denies Tobacco: yes Illicit drugs: denies Prescription drug abuse: denies Rehab hx: denies  Exam Findings   Vital Signs:  Temp:  [97.6 F (36.4 C)-98.6 F (37 C)] 98.4 F (36.9 C) (04/06 1609) Pulse Rate:  [50-60] 59 (04/06 1609) Resp:  [16-20] 16 (04/06 0755) BP: (113-155)/(53-67) 122/56 (04/06 1609) SpO2:  [93 %-100 %] 95 % (04/06 1609) Weight:  [58.2 kg] 58.2 kg (04/06 0500) Blood pressure (!) 122/56, pulse (!) 59, temperature 98.4 F (36.9 C), temperature source Oral, resp. rate 16, height 5\' 5"  (1.651 m), weight 58.2 kg, SpO2 95%. Body mass index is 21.35 kg/m.   Mental Status Exam: General Appearance: Disheveled  Orientation:  Full (Time, Place, and Person)  Memory:  Immediate;   Fair Recent;   Fair Remote;   Fair  Concentration:  Concentration: Good and Attention Span: Good  Recall:  Fair  Attention  Good  Eye Contact:  Good  Speech:  Normal Rate  Language:  Fair  Volume:  Normal  Mood: "depressed"  Affect:  Congruent  Thought Process:  Coherent and Linear  Thought  Content:  WDL  Suicidal Thoughts:  No  Homicidal Thoughts:  No  Judgement:  Fair  Insight:  Fair  Psychomotor Activity:  Normal  Akathisia:  NA  Fund of Knowledge:  Fair      Assets:  Architect Housing  Cognition:  WNL  ADL's:  Impaired  AIMS (if indicated):        Other History   These have been pulled in through the EMR, reviewed, and updated if appropriate.  Family History:  The patient's family history includes Alcohol abuse in her father; Anxiety disorder in her mother; Bipolar disorder in her son; Breast cancer (age of onset: 59) in her mother; Cancer in her father; Depression in her father and mother; Gallbladder disease in her  father.  Medical History: Past Medical History:  Diagnosis Date   Allergy    Anxiety    Asthma    Chronic pain syndrome    discharged from pain clinic, hx of narcotics seeking behavior   COPD (chronic obstructive pulmonary disease) (HCC)    CVA (cerebral infarction)    Depression    Fibromyalgia    Headache    Hyperlipidemia    Hypertension    IBS (irritable bowel syndrome)    Stroke (HCC)    Vitamin D deficiency     Surgical History: Past Surgical History:  Procedure Laterality Date   ABDOMINAL HYSTERECTOMY     APPENDECTOMY     BREAST EXCISIONAL BIOPSY  2011   Pt states lump removed, ? side, no scar seen   BREAST SURGERY  2011   biopsy   CERVICAL DISCECTOMY     CHOLECYSTECTOMY     RIGHT HEART CATH N/A 04/30/2023   Procedure: RIGHT HEART CATH;  Surgeon: Marcina Millard, MD;  Location: [facility] INVASIVE CV LAB;  Service: Cardiovascular;  Laterality: N/A;   SINUSOTOMY       Medications:   Current Facility-Administered Medications:    acetaminophen (TYLENOL) tablet 650 mg, 650 mg, Oral, Q6H PRN **OR** acetaminophen (TYLENOL) suppository 650 mg, 650 mg, Rectal, Q6H PRN, Andris Baumann, MD   acetaminophen (TYLENOL) tablet 650 mg, 650 mg, Oral, Q4H PRN, Paraschos, Alexander, MD, 650 mg at  05/01/23 1507   amiodarone (PACERONE) tablet 200 mg, 200 mg, Oral, BID, 200 mg at 05/04/23 1024 **FOLLOWED BY** [START ON 05/11/2023] amiodarone (PACERONE) tablet 200 mg, 200 mg, Oral, Daily, Hudson, Caralyn, PA-C   apixaban (ELIQUIS) tablet 5 mg, 5 mg, Oral, BID, Netta Neat, RPH, 5 mg at 05/04/23 1024   atorvastatin (LIPITOR) tablet 40 mg, 40 mg, Oral, QHS, Netta Neat, RPH, 40 mg at 05/03/23 2109   busPIRone (BUSPAR) tablet 15 mg, 15 mg, Oral, BID, Chalmers Guest, Brooks Stotz, MD, 15 mg at 05/04/23 1025   Chlorhexidine Gluconate Cloth 2 % PADS 6 each, 6 each, Topical, Daily, Wieting, Richard, MD, 6 each at 05/04/23 1043   chlorpheniramine-HYDROcodone (TUSSIONEX) 10-8 MG/5ML suspension 5 mL, 5 mL, Oral, Q12H PRN, Mansy, Jan A, MD, 5 mL at 05/01/23 2132   docusate sodium (COLACE) capsule 100 mg, 100 mg, Oral, BID, Esaw Grandchild A, DO, 100 mg at 05/03/23 2109   feeding supplement (ENSURE ENLIVE / ENSURE PLUS) liquid 237 mL, 237 mL, Oral, TID BM, Griffith, Kelly A, DO, 237 mL at 05/04/23 1249   fentaNYL (SUBLIMAZE) bolus via infusion 25-100 mcg, 25-100 mcg, Intravenous, Q15 min PRN, Tukov-Yual, Magdalene S, NP, 100 mcg at 04/29/23 0423   fentaNYL (SUBLIMAZE) injection 25 mcg, 25 mcg, Intravenous, Once, Tukov-Yual, Magdalene S, NP   fluticasone furoate-vilanterol (BREO ELLIPTA) 200-25 MCG/ACT 1 puff, 1 puff, Inhalation, Daily, 1 puff at 05/04/23 0829 **AND** umeclidinium bromide (INCRUSE ELLIPTA) 62.5 MCG/ACT 1 puff, 1 puff, Inhalation, Daily, Lindajo Royal V, MD, 1 puff at 05/04/23 0828   furosemide (LASIX) injection 40 mg, 40 mg, Intravenous, Once, Esaw Grandchild A, DO   furosemide (LASIX) tablet 40 mg, 40 mg, Oral, Daily, Hudson, Caralyn, PA-C, 40 mg at 05/03/23 0947   gabapentin (NEURONTIN) capsule 100 mg, 100 mg, Oral, TID, Esaw Grandchild A, DO, 100 mg at 05/04/23 1034   guaiFENesin (MUCINEX) 12 hr tablet 600 mg, 600 mg, Oral, Q12H, Esaw Grandchild A, DO, 600 mg at 05/04/23 1024    HYDROcodone-acetaminophen (NORCO/VICODIN) 5-325 MG per tablet 1-2 tablet, 1-2 tablet,  Oral, Q4H PRN, Andris Baumann, MD, 2 tablet at 04/29/23 2155   insulin aspart (novoLOG) injection 0-9 Units, 0-9 Units, Subcutaneous, Q4H, Manuela Schwartz, NP, 2 Units at 05/03/23 2107   ipratropium-albuterol (DUONEB) 0.5-2.5 (3) MG/3ML nebulizer solution 3 mL, 3 mL, Nebulization, Q4H PRN, Manuela Schwartz, NP, 3 mL at 05/04/23 1009   lamoTRIgine (LAMICTAL) tablet 25 mg, 25 mg, Oral, Daily, Verner Chol, MD, 25 mg at 05/04/23 1042   levothyroxine (SYNTHROID) tablet 12.5 mcg, 12.5 mcg, Oral, Q0600, Netta Neat, RPH, 12.5 mcg at 05/04/23 0513   LORazepam (ATIVAN) tablet 0.5 mg, 0.5 mg, Oral, Q8H PRN, Pennie Banter, DO, 0.5 mg at 05/04/23 0981   melatonin tablet 5 mg, 5 mg, Oral, QHS, Netta Neat, RPH, 5 mg at 05/03/23 2109   metoprolol tartrate (LOPRESSOR) injection 5 mg, 5 mg, Intravenous, Q2H PRN, Hudson, Caralyn, PA-C   metoprolol tartrate (LOPRESSOR) tablet 25 mg, 25 mg, Oral, BID, Paraschos, Alexander, MD, 25 mg at 05/04/23 1024   mirtazapine (REMERON) tablet 7.5 mg, 7.5 mg, Oral, QHS, Netta Neat, RPH, 7.5 mg at 05/03/23 2108   montelukast (SINGULAIR) tablet 10 mg, 10 mg, Oral, QHS, Esaw Grandchild A, DO, 10 mg at 05/03/23 2109   morphine (PF) 2 MG/ML injection 2 mg, 2 mg, Intravenous, Q2H PRN, Andris Baumann, MD, 2 mg at 05/04/23 1914   multivitamin with minerals tablet 1 tablet, 1 tablet, Oral, Daily, Esaw Grandchild A, DO, 1 tablet at 05/04/23 1024   nitroGLYCERIN (NITROSTAT) SL tablet 0.4 mg, 0.4 mg, Sublingual, Q5 min PRN, Andris Baumann, MD   ondansetron [facility]) injection 4 mg, 4 mg, Intravenous, Once, Manuela Schwartz, NP   Oral care mouth rinse, 15 mL, Mouth Rinse, PRN, Raechel Chute, MD   pantoprazole (PROTONIX) injection 40 mg, 40 mg, Intravenous, QHS, Lowella Bandy, RPH, 40 mg at 05/03/23 2109   polyethylene glycol (MIRALAX / GLYCOLAX) packet 17 g, 17 g, Oral, Daily, Pennie Banter, DO   [START ON 05/05/2023] predniSONE (DELTASONE) tablet 30 mg, 30 mg, Oral, Q breakfast, Esaw Grandchild A, DO   sodium chloride flush (NS) 0.9 % injection 3 mL, 3 mL, Intravenous, Q12H, Paraschos, Alexander, MD, 3 mL at 05/04/23 1002   sodium chloride flush (NS) 0.9 % injection 3 mL, 3 mL, Intravenous, PRN, Paraschos, Alexander, MD   venlafaxine XR (EFFEXOR-XR) 24 hr capsule 37.5 mg, 37.5 mg, Oral, Q breakfast, Chalmers Guest, Sohana Austell, MD, 37.5 mg at 05/04/23 1042  Allergies: Allergies  Allergen Reactions   Bextra  [Valdecoxib]    Compazine [Prochlorperazine Edisylate]     Stroke-like symptoms   Lithium Carbonate     Leg weakness   Lyrica [Pregabalin]     Sanjuana Mae, MD

## 2023-05-04 NOTE — Progress Notes (Addendum)
 Progress Note   Patient: Beth [name] YNW:[id number] DOB: May 01, 1945 DOA: 04/25/2023     9 DOS: the patient was seen and examined on 05/04/2023   Brief hospital course: 78 y.o. female with medical history significant for CVA with left-sided residual weakness, depression, anxiety, neuropathy, insomnia, atrial fibrillation on Eliquis, COPD on home O2 at 3 L who was brought to the ED after she tried to stab herself with scissors.  She endorses feeling suicidal.  Patient recently recovered from a bout of pneumonia, completing antibiotic treatment.  She did endorse diarrhea in the few days prior.  She has had very poor oral intake. ED course and data review: BP slightly elevated on arrival at 170/72, pulse in the 50s otherwise normal vitals Labs notable for potassium of 2.2 and magnesium 1.6.  TSH slightly elevated at 5.528.  Free T4 also elevated at 1.66 Urinalysis with moderate leukocytes CBC WNL, BMP except for potassium WNL. UDS, salicylate, EtOH and acetaminophen levels within normal parameters EKG, personally reviewed and interpreted showing sinus bradycardia at 51 and prolonged QT to 648   Patient was started on ceftriaxone for possibility of UTI.  Also given oral and IV potassium repletion.   Seen by behavioral health,    Hospitalist consulted for admission.   3/28.  Potassium still low this morning IV and oral potassium ordered.  3/29.  Psychiatry changed her medications over to Remeron at night secondary to prolonged QT.  Electrolytes much improved.  Patient felt short of breath this morning.  Chest x-ray showing overall improvement in bilateral airspace opacities from prior studies.  Patient had worsening shortness of breath and was acidotic and sent to the ICU for BiPAP and started on aggressive antibiotics and steroids.  3/30.  Patient did not like the BiPAP mask.  Placed back on nasal cannula.  On IV fluids.  Continue Zosyn.  Discontinue vancomycin with MRSA PCR being negative.   Continue Solu-Medrol.  3/31 -- pt acutely decompensated with flash pulmonary edema, in addition to SVT/VT followed by A-fib with RVR.  Pt had to emergently intubated to transferred to ICU.  In ICU, Cardiology was consulted.  Patient was aggressively diuresed and placed on IV amiodarone. Tested + for non-Covid coronoavirus OC43. Extubated 4/1.  4/2 -- TRH resumed care, pt extubated yesterday.   RHC cath today, see report and Cardiology recommendations.  Further hospital course and management as outlined below. \  Assessment and Plan:  Acute on chronic respiratory failure with hypoxia and hypercapnia  Required BiPAP >> emergent intubation on 3/31, extubated 4/1 after aggressive diuresis for flash pulmonary edema --Mgmt of underlying conditions as below --O2 per protocol, wean as tolerated --BiPAP PRN --Pulmonary hygiene   4/6 -- decompensated this AM.  Obtained CXR and labs.  Overall consistent with recurrent decompensation of CHF with pulmonary edema  Septic shock - resolved off pressors Multifocal pneumonia, tachypnea, leukocytosis and elevated lactic acid.  Given IV fluids per sepsis on admission. COPD with acute exacerbation Hx of Asthma/COPD --Completed IV antibiotics - Zosyn x 7 days --IV steroids >> Prednisone 40 mg and tapering down --Bronchodilators --Mucolytics and other supportive care per orders   Flash Pulmonary Edema - required intubation on 3/31, aggressively diuresed, extubated 4/1. Acute on Chronic diastolic congestive heart failure  SVT  / V Tach A-Fib with RVR Demand Ischemia Hx of Paroxysmal A-fib --Cardiology following - see ercs --Off amio drip 4/3 --Amiodarone 200 mg PO BID x 10 days, then 200 mg daily --Eliquis for stroke risk reduction --  Lipitor --Metoprolol was stopped --Daily weights & strict Io's --IV Lasix 40 mg this AM -- monitor response. Hold PO Lasix. --Monitor renal function, electrolytes  --RHC cath 4/2 - see report    Atrial  fibrillation with slow ventricular response (HCC) --Management per Cardiology --Continue Eliquis --Metoprolol was stopped, now resumed --Transitioned amiodarone drip >> PO yesterday, 200 mg BID x 10 days, then 200 mg daily --Telemetry --Maintain K>4, Mg>2    Suicidal ideations - POA Depression and Anxiety Psychiatry consulted 3/28 - recommended inpatient psych admission when medically cleared.  IVC was only recommended in the event pt attempted to leave the hospital, but appears patient is still under IVC. --One-to-one observation.   --Psychiatry to re-assess this afternoon --On Buspar 10 mg BID, Prozac 20 mg daily, Trazodone at bedtime --Remeron 7.5 mg at bedtime was added by psychiatry   Metabolic acidosis - resolved --Monitor BMP   Hypophosphatemia Replaced   Hypomagnesemia Replaced   Hypokalemia Replaced   UTI (urinary tract infection) Urine culture only 20k colonies E. Coli   Prolonged QT interval Avoid QT prolonging drugs Serial ekg's Maintain K>4, Mg>2 Telemetry  Diarrhea Stool studies negative   Other specified hypothyroidism Both TSH and T4 elevated --Continue Synthroid 25 mcg    Pulmonary hypertension  Control blood pressure. O2 per protocol. Monitor hemodynamics closely.  Failure to thrive Poor PO intake --Palliative care consulted for GOC and code status discussions, appreciate assistance      Subjective: Pt resting in bed with son and ex-husband at bedside, seen this afternoon.  She reports breathing improved after IV Lasix this AM, but still feels quite short of breath and working to breathe.  No other acute complaints.  Asks if she can still get out of here tomorrow.     Physical Exam: Vitals:   05/04/23 0345 05/04/23 0500 05/04/23 0755 05/04/23 1216  BP: (!) 143/67  (!) 155/67 (!) 113/53  Pulse: (!) 50  (!) 59 60  Resp: 19  16   Temp: 97.7 F (36.5 C)  98.6 F (37 C)   TempSrc: Oral     SpO2: 100%  100% 93%  Weight:  58.2 kg     Height:       General exam: awake, drowsy appearing, no acute distress, frail and chronically ill appearing Respiratory system: lung sounds improved since this morning, no wheezes, still using accessory muscles on 3 L.min [place] O2 Cardiovascular system: normal S1/S2, RRR, no pedal edema.   Gastrointestinal system: soft, NT, ND Central nervous system: no gross focal neurologic deficits, normal speech Extremities: moves all, no edema, normal tone Skin: dry, intact, normal temperature Psychiatry: depressed mood, congruent affect  Data Reviewed:  Notable labs --   BMP normal except glucose 101, albumin 2.4 WBC 12.4 Hbg 9.7  BNP 1414.5 rising (992 yesterday) Troponin 29 >. 33 Lactic acid 2.1  CXR -- persistent coarse bilateral intestitial opacities increased at bases since CXR yesterday     Family Communication: Updated son [name] at bedside this AM during rapid and during afternoon follow up.   Disposition: Status is: Inpatient Remains inpatient appropriate because: decompensated today and requiring IV diuresis again for worsening respiratory status.    Planned Discharge Destination: Skilled nursing facility    Time spent: 35 minutes  Author: Pennie Banter, DO 05/04/2023 2:47 PM  For on call review [email or web address].

## 2023-05-05 DIAGNOSIS — Z515 Encounter for palliative care: Secondary | ICD-10-CM | POA: Diagnosis not present

## 2023-05-05 DIAGNOSIS — Z7189 Other specified counseling: Secondary | ICD-10-CM | POA: Diagnosis not present

## 2023-05-05 DIAGNOSIS — Z711 Person with feared health complaint in whom no diagnosis is made: Secondary | ICD-10-CM | POA: Diagnosis not present

## 2023-05-05 DIAGNOSIS — F329 Major depressive disorder, single episode, unspecified: Secondary | ICD-10-CM | POA: Diagnosis not present

## 2023-05-05 DIAGNOSIS — F411 Generalized anxiety disorder: Secondary | ICD-10-CM | POA: Diagnosis not present

## 2023-05-05 DIAGNOSIS — J9622 Acute and chronic respiratory failure with hypercapnia: Secondary | ICD-10-CM | POA: Diagnosis not present

## 2023-05-05 DIAGNOSIS — E876 Hypokalemia: Secondary | ICD-10-CM | POA: Diagnosis not present

## 2023-05-05 DIAGNOSIS — J9621 Acute and chronic respiratory failure with hypoxia: Secondary | ICD-10-CM | POA: Diagnosis not present

## 2023-05-05 LAB — MAGNESIUM: Magnesium: 2.3 mg/dL (ref 1.7–2.4)

## 2023-05-05 LAB — CBC
HCT: 29.9 % — ABNORMAL LOW (ref 36.0–46.0)
Hemoglobin: 9.8 g/dL — ABNORMAL LOW (ref 12.0–15.0)
MCH: 30.1 pg (ref 26.0–34.0)
MCHC: 32.8 g/dL (ref 30.0–36.0)
MCV: 91.7 fL (ref 80.0–100.0)
Platelets: 242 10*3/uL (ref 150–400)
RBC: 3.26 MIL/uL — ABNORMAL LOW (ref 3.87–5.11)
RDW: 17.2 % — ABNORMAL HIGH (ref 11.5–15.5)
WBC: 14.4 10*3/uL — ABNORMAL HIGH (ref 4.0–10.5)
nRBC: 0.3 % — ABNORMAL HIGH (ref 0.0–0.2)

## 2023-05-05 LAB — RENAL FUNCTION PANEL
Albumin: 2.5 g/dL — ABNORMAL LOW (ref 3.5–5.0)
Anion gap: 9 (ref 5–15)
BUN: 21 mg/dL (ref 8–23)
CO2: 30 mmol/L (ref 22–32)
Calcium: 9.1 mg/dL (ref 8.9–10.3)
Chloride: 104 mmol/L (ref 98–111)
Creatinine, Ser: 0.82 mg/dL (ref 0.44–1.00)
GFR, Estimated: >60 mL/min (ref >60)
Glucose, Bld: 99 mg/dL (ref 70–99)
Phosphorus: 3.3 mg/dL (ref 2.5–4.6)
Potassium: 4.3 mmol/L (ref 3.5–5.1)
Sodium: 143 mmol/L (ref 135–145)

## 2023-05-05 LAB — GLUCOSE, CAPILLARY
Glucose-Capillary: 120 mg/dL — ABNORMAL HIGH (ref 70–99)
Glucose-Capillary: 166 mg/dL — ABNORMAL HIGH (ref 70–99)
Glucose-Capillary: 87 mg/dL (ref 70–99)

## 2023-05-05 MED ORDER — BIOTENE DRY MOUTH MT LIQD: 15.0000 mL | Freq: Two times a day (BID) | OROMUCOSAL | Status: DC

## 2023-05-05 MED ORDER — POLYVINYL ALCOHOL 1.4 % OP SOLN: 1.0000 [drp] | Freq: Four times a day (QID) | OPHTHALMIC | Status: DC | PRN

## 2023-05-05 MED ORDER — ORAL CARE MOUTH RINSE: 15.0000 mL | OROMUCOSAL | Status: DC | PRN

## 2023-05-05 MED ORDER — IPRATROPIUM-ALBUTEROL 0.5-2.5 (3) MG/3ML IN SOLN: 3.0000 mL | RESPIRATORY_TRACT | Status: DC | PRN

## 2023-05-05 MED ORDER — DOCUSATE SODIUM 100 MG PO CAPS: 100.0000 mg | ORAL_CAPSULE | Freq: Two times a day (BID) | ORAL | Status: DC

## 2023-05-05 MED ORDER — MORPHINE SULFATE (PF) 2 MG/ML IV SOLN: 1.0000 mg | INTRAVENOUS | Status: DC | PRN

## 2023-05-05 MED ORDER — HALOPERIDOL LACTATE 5 MG/ML IJ SOLN: 2.0000 mg | Freq: Four times a day (QID) | INTRAMUSCULAR | Status: DC | PRN

## 2023-05-05 MED ORDER — GLYCOPYRROLATE 0.2 MG/ML IJ SOLN: 0.2000 mg | INTRAMUSCULAR | Status: DC | PRN

## 2023-05-05 MED ORDER — FLUTICASONE FUROATE-VILANTEROL 200-25 MCG/ACT IN AEPB: 1.0000 | INHALATION_SPRAY | Freq: Every day | RESPIRATORY_TRACT | Status: DC

## 2023-05-05 MED ORDER — HALOPERIDOL 2 MG PO TABS: 2.0000 mg | ORAL_TABLET | Freq: Four times a day (QID) | ORAL | Status: DC | PRN

## 2023-05-05 MED ORDER — GLYCOPYRROLATE 1 MG PO TABS: 1.0000 mg | ORAL_TABLET | ORAL | Status: DC | PRN

## 2023-05-05 MED ORDER — ACETAMINOPHEN 325 MG PO TABS: 650.0000 mg | ORAL_TABLET | Freq: Four times a day (QID) | ORAL | Status: DC | PRN

## 2023-05-05 MED ORDER — ENSURE ENLIVE PO LIQD: 237.0000 mL | Freq: Three times a day (TID) | ORAL | Status: DC

## 2023-05-05 MED ORDER — ONDANSETRON 4 MG PO TBDP: 4.0000 mg | ORAL_TABLET | Freq: Four times a day (QID) | ORAL | Status: DC | PRN

## 2023-05-05 MED ORDER — ONDANSETRON HCL 4 MG/2ML IJ SOLN: 4.0000 mg | Freq: Four times a day (QID) | INTRAMUSCULAR | Status: DC | PRN

## 2023-05-05 MED ORDER — ACETAMINOPHEN 650 MG RE SUPP: 650.0000 mg | Freq: Four times a day (QID) | RECTAL | Status: DC | PRN

## 2023-05-05 MED ORDER — HALOPERIDOL LACTATE 2 MG/ML PO CONC: 2.0000 mg | Freq: Four times a day (QID) | ORAL | Status: DC | PRN

## 2023-05-05 MED ORDER — ONDANSETRON 4 MG PO TBDP
4.0000 mg | ORAL_TABLET | Freq: Four times a day (QID) | ORAL | Status: DC | PRN
Start: 2023-05-05 — End: 2023-05-06

## 2023-05-05 MED ORDER — DIPHENHYDRAMINE HCL 50 MG/ML IJ SOLN: 25.0000 mg | INTRAMUSCULAR | Status: DC | PRN

## 2023-05-05 MED ORDER — MORPHINE SULFATE (PF) 2 MG/ML IV SOLN
1.0000 mg | INTRAVENOUS | Status: DC | PRN
  Administered 2023-05-05: 2 mg via INTRAVENOUS
  Administered 2023-05-05: 4 mg via INTRAVENOUS
  Administered 2023-05-05: 2 mg via INTRAVENOUS
  Filled 2023-05-05 (×2): qty 1
  Filled 2023-05-05: qty 2

## 2023-05-05 MED ORDER — LORAZEPAM 2 MG/ML IJ SOLN
1.0000 mg | INTRAMUSCULAR | Status: DC | PRN
  Administered 2023-05-05 (×3): 1 mg via INTRAVENOUS
  Filled 2023-05-05 (×3): qty 1

## 2023-05-05 MED ORDER — LORAZEPAM 2 MG/ML IJ SOLN: 1.0000 mg | INTRAMUSCULAR | Status: DC | PRN

## 2023-05-05 MED ORDER — UMECLIDINIUM BROMIDE 62.5 MCG/ACT IN AEPB: 1.0000 | INHALATION_SPRAY | Freq: Every day | RESPIRATORY_TRACT | Status: DC

## 2023-05-05 NOTE — Progress Notes (Signed)
 [facility] Liaison Note  05/05/2023  [name] 1945-03-09 [id number]  Location: RN Hospital Liaison screened the patient remotely at [facility].  Insurance: [employer]   Beth [name] is a 78 y.o. female who is a Primary Care Patient of Althea Charon, Netta Neat, DO [facility]. The patient was screened for  readmission hospitalization with noted extreme risk score for unplanned readmission risk with 1 IP in 6 months.  The patient was assessed for potential Care Management service needs for post hospital transition for care coordination. Review of patient's electronic medical record reveals patient was admitted for Acute on Chronic respiratory failure with hypoxia and hypercapnia. Pt will transition to SNF level of care ([facility]) for STR. This facility will continue to address pt's needs.   [facility] Care Management/Population Health does not replace or interfere with any arrangements made by the Inpatient Transition of Care team.   For questions contact:   Elliot Cousin, RN, BSN Hospital Liaison [place]   [facility], Population Health Office Hours MTWF  8:00 am-6:00 pm Direct Dial: [phone] mobile [email or web address]

## 2023-05-05 NOTE — TOC Progression Note (Signed)
 Transition of Care [facility]) - Progression Note    Patient Details  Name: Beth [name] MRN: [id number] Date of Birth: [birth year]  Transition of Care [facility]) CM/SW Contact  Truddie Hidden, RN Phone Number: 05/05/2023, 12:50 PM  Clinical Narrative:    Sherron Monday with [name], patient's son regarding message received from Palliative NP, Darl Pikes stated family's request for hospice IPU. [name] confirmed request for [facility]. He was advised patient would have to meet inpatient criteria and will be screened by Ree Kida from [facility]. [name] stated his understanding.   Referral made to Ree Kida from [employer].    Expected Discharge Plan: Skilled Nursing Facility Barriers to Discharge: Continued Medical Work up  Expected Discharge Plan and Services       Living arrangements for the past 2 months: Skilled Nursing Facility                                       Social Determinants of Health (SDOH) Interventions SDOH Screenings   Food Insecurity: No Food Insecurity (04/25/2023)  Housing: Low Risk  (04/25/2023)  Transportation Needs: Unmet Transportation Needs (04/25/2023)  Utilities: Not At Risk (04/25/2023)  Alcohol Screen: Low Risk  (02/01/2022)  Depression (PHQ2-9): High Risk (05/13/2022)  Financial Resource Strain: Low Risk  (02/01/2022)  Physical Activity: Insufficiently Active (02/01/2022)  Social Connections: Socially Isolated (04/25/2023)  Stress: No Stress Concern Present (02/01/2022)  Tobacco Use: High Risk (04/24/2023)  Health Literacy: Adequate Health Literacy (09/16/2022)    Readmission Risk Interventions    04/26/2023    4:30 PM 09/09/2022   12:06 PM  Readmission Risk Prevention Plan  Transportation Screening Complete Complete  PCP or Specialist Appt within 3-5 Days Complete   HRI or Home Care Consult Complete   Social Work Consult for Recovery Care Planning/Counseling Complete   Palliative Care Screening Not Applicable   Medication Review [facility])  Complete Referral to Pharmacy  PCP or Specialist appointment within 3-5 days of discharge  Complete  HRI or Home Care Consult  Complete  SW Recovery Care/Counseling Consult  Complete  Palliative Care Screening  Complete  Skilled Nursing Facility  Complete

## 2023-05-05 NOTE — Progress Notes (Addendum)
 Palliative Care Progress Note, Assessment & Plan   Patient Name: Beth [name]       Date: 05/05/2023 DOB: 01-Apr-1945  Age: 78 y.o. MRN#: [id number] Attending Physician: Pennie Banter, DO Primary Care Physician: Smitty Cords, DO Admit Date: 04/25/2023  Subjective: Reports feeling better than yesterday but still having shortness of breath. She also complains of leg pain. No chest pain. Does not have an appetite.    HPI: 78 y.o. female with medical history significant for CVA with left-sided residual weakness, depression, anxiety, neuropathy, insomnia, atrial fibrillation on Eliquis, COPD on home O2 at 3 L who was brought to the ED after she tried to stab herself with scissors.  She endorses feeling suicidal.  Patient recently recovered from a bout of pneumonia, completing antibiotic treatment.  She did endorse diarrhea in the few days prior.  She has had very poor oral intake.   ED course and data review: BP slightly elevated on arrival at 170/72, pulse in the 50s otherwise normal vitals Labs notable for potassium of 2.2 and magnesium 1.6.  TSH slightly elevated at 5.528.  Free T4 also elevated at 1.66 Urinalysis with moderate leukocytes CBC WNL, BMP except for potassium WNL. UDS, salicylate, EtOH and acetaminophen levels within normal parameters EKG, personally reviewed and interpreted showing sinus bradycardia at 51 and prolonged QT to 648   Patient was started on ceftriaxone for possibility of UTI.  Also given oral and IV potassium repletion.   Seen by behavioral health.   Hospitalist consulted for admission.    3/28 Potassium still low this morning IV and oral potassium ordered.   3/29 Psychiatry changed her medications over to Remeron at night secondary to prolonged QT.   Electrolytes much improved.  Patient felt short of breath this morning.  Chest x-ray showing overall improvement in bilateral airspace opacities from prior studies.   Patient had worsening shortness of breath and was acidotic and sent to the ICU for BiPAP and started on aggressive antibiotics and steroids.   3/30 Patient did not like the BiPAP mask.  Placed back on nasal cannula.  On IV fluids.  Continue Zosyn.  Discontinue vancomycin with MRSA PCR being negative.  Continue Solu-Medrol.   3/31 pt acutely decompensated with flash pulmonary edema, in addition to SVT/VT followed by A-fib with RVR.  Pt had to emergently intubated to transferred to ICU.   In ICU, Cardiology was consulted.  Patient was aggressively diuresed and placed on IV amiodarone. Tested + for non-Covid coronoavirus OC43. Extubated 4/1.   4/2 TRH resumed care, pt extubated yesterday.   RHC cath today, see report and Cardiology recommendations.   4/4 IVC rescinded today, PMT re-consulted to discuss goals of care.    4/5 Rapid response called for respiratory distress.   Summary of counseling/coordination of care: Extensive chart review completed prior to meeting patient including labs, vital signs, imaging, progress notes, orders, and available advanced directive documents from current and previous encounters.   After reviewing the patient's chart and assessing the patient at bedside, I spoke with patient and her son, Beth [name], in regards to symptom management and goals of care.  Ill-appearing elderly female lying in bed. She is A&O, but appears  anxious. She has increased WOB with accessory muscle use and is unable to talk in complete sentences. She is in no distress.    Beth [name], son, at the bedside shares that his mother has not eaten anything significant in approximately 7-8 weeks. He is tearful and concerned about her appearing to "suffer" with difficulty breathing and feels that her respiratory distress events will continue. He  reports speaking with his mother yesterday and this morning about focusing on comfort and stopping medical interventions. Cicely shares that she wants to "stop treatment, be comfortable." Both her and Beth [name] verbalize understanding that comfort care means discontinuing medical interventions and focusing on comfort at end of life. Mileidy states she does not want to leave her son. Beth [name] assures his mother that it is ok for her to choose comfort care. Kenia asks about where she will go from here and was advised that she will be evaluated for care at the Hospice home. Both are in agreement with this plan.     Therapeutic silence and active listening provided for Beth [name] and Quantia to share their thoughts and emotions regarding current medical situation.  Emotional support provided.  Physical Exam Vitals reviewed.  Constitutional:      General: She is not in acute distress.    Appearance: She is ill-appearing.  HENT:     Mouth/Throat:     Mouth: Mucous membranes are dry.  Pulmonary:     Effort: No respiratory distress.     Comments: Increased WOB, tachypnea Musculoskeletal:     Right lower leg: No edema.     Left lower leg: No edema.  Skin:    General: Skin is warm and dry.  Neurological:     Mental Status: She is alert and oriented to person, place, and time.  Psychiatric:        Mood and Affect: Mood normal.        Behavior: Behavior normal.        Thought Content: Thought content normal.        Judgment: Judgment normal.            Discussed with Dr. Denton Lank in hallway, primary RN, [facility], hospice liaison and consulting cardiology team via Epic chat about transition to comfort care and evaluation for hospice IPU.    Comfort orders placed.    Total Time 50 minutes   Time spent includes: Detailed review of medical records (labs, imaging, vital signs), medically appropriate exam (mental status, respiratory, cardiac, skin), discussed with treatment team, counseling and educating patient, family  and staff, documenting clinical information, medication management and coordination of care.  Addendum:  49- Assessed pt with multiple family members at bedside. Pt having mild dyspnea and complaining of leg pain not relieved with last pain medication. Called RN Revonda Standard to request another dose of ordered pain medication as well as lorazepam for anxiety.   1600- Notified by [facility] liaison that patient has been accepted to [facility] and planned transport this evening around 2000.    Alex Gardener, Juel Burrow- [facility] Palliative Medicine Team  05/05/2023 10:41 AM  Office [phone]  Pager [phone]

## 2023-05-05 NOTE — Discharge Summary (Signed)
 Physician Discharge Summary   Patient: Beth [name] MRN: [id number] DOB: [birth year]  Admit date:     04/25/2023  Discharge date: 05/05/23  Discharge Physician: Pennie Banter   PCP: Smitty Cords, DO   Recommendations at discharge:    Follow up with Hospice staff upon arrival  Discharge Diagnoses: Principal Problem:   Acute on chronic respiratory failure with hypoxia and hypercapnia (HCC) Active Problems:   Septic shock (HCC)   Metabolic acidosis   Hypokalemia   Hypomagnesemia   Hypophosphatemia   Prolonged QT interval   UTI (urinary tract infection)   Depression with suicidal ideation   Atrial fibrillation with slow ventricular response (HCC)   Pulmonary hypertension (HCC)   Chronic diastolic congestive heart failure (HCC)   Asthma with COPD (HCC)   Other specified hypothyroidism   Suicidal ideation   Diarrhea   Multifocal pneumonia   Precordial chest pain   Respiratory distress   Protein-calorie malnutrition, severe   Decompensated heart failure (HCC)   MDD (major depressive disorder), recurrent episode (HCC)   Suicidal ideations   Hospice care patient  Resolved Problems:   * No resolved hospital problems. *  Hospital Course:  78 y.o. female with medical history significant for CVA with left-sided residual weakness, depression, anxiety, neuropathy, insomnia, atrial fibrillation on Eliquis, COPD on home O2 at 3 L who was brought to the ED after she tried to stab herself with scissors.  She endorses feeling suicidal.  Patient recently recovered from a bout of pneumonia, completing antibiotic treatment.  She did endorse diarrhea in the few days prior.  She has had very poor oral intake. ED course and data review: BP slightly elevated on arrival at 170/72, pulse in the 50s otherwise normal vitals Labs notable for potassium of 2.2 and magnesium 1.6.  TSH slightly elevated at 5.528.  Free T4 also elevated at 1.66 Urinalysis with moderate leukocytes CBC  WNL, BMP except for potassium WNL. UDS, salicylate, EtOH and acetaminophen levels within normal parameters EKG, personally reviewed and interpreted showing sinus bradycardia at 51 and prolonged QT to 648   Patient was started on ceftriaxone for possibility of UTI.  Also given oral and IV potassium repletion.   Seen by behavioral health,    Hospitalist consulted for admission.    3/28.  Potassium still low this morning IV and oral potassium ordered.  3/29.  Psychiatry changed her medications over to Remeron at night secondary to prolonged QT.  Electrolytes much improved.  Patient felt short of breath this morning.  Chest x-ray showing overall improvement in bilateral airspace opacities from prior studies.   Patient had worsening shortness of breath and was acidotic and sent to the ICU for BiPAP and started on aggressive antibiotics and steroids.   3/30.  Patient did not like the BiPAP mask.  Placed back on nasal cannula.  On IV fluids.  Continue Zosyn.  Discontinue vancomycin with MRSA PCR being negative.  Continue Solu-Medrol.   3/31 -- pt acutely decompensated with flash pulmonary edema, in addition to SVT/VT followed by A-fib with RVR.  Pt had to emergently intubated to transferred to ICU.   In ICU, Cardiology was consulted.  Patient was aggressively diuresed and placed on IV amiodarone. Tested + for non-Covid coronoavirus OC43. Extubated 4/1.   4/2 -- TRH resumed care, pt extubated yesterday.   RHC cath today, see report and Cardiology recommendations.   Further hospital course and management as outlined below.   In summary, hospital course further prolonged and  complicated by recurrent pulmonary edema and respiratory decompensation in the setting of severe valvular heart disease, ongoing poor PO intake and failure to thrive.   Palliative care has been involved with goals of care discussions.   4/7 -- decision made to transition to comfort care  4/7 PM -- pt is medically stable  for transport to hospice this evening.  Assessment and Plan:  Comfort Care Measures Only -- as of 4/7 AM --Hospice liaison notified --Appreicate Palliative Care's assistance in GOC talks and comfort medications --Notify provider if any uncontrolled signs of pain, distress, discomfort, anxiety, agitation, dyspnea etc.  --Comfort care per orders PRN     ======================================= A&P prior to transition to comfort care:   Acute on chronic respiratory failure with hypoxia and hypercapnia  Required BiPAP >> emergent intubation on 3/31, extubated 4/1 after aggressive diuresis for flash pulmonary edema --Mgmt of underlying conditions as below --O2 per protocol, wean as tolerated --BiPAP PRN --Pulmonary hygiene    4/6 -- decompensated this AM.  Obtained CXR and labs.  Overall consistent with recurrent decompensation of CHF with pulmonary edema   Septic shock - resolved off pressors Multifocal pneumonia, tachypnea, leukocytosis and elevated lactic acid.  Given IV fluids per sepsis on admission. COPD with acute exacerbation Hx of Asthma/COPD --Completed IV antibiotics - Zosyn x 7 days --IV steroids >> Prednisone 40 mg and tapering down --Bronchodilators --Mucolytics and other supportive care per orders   Flash Pulmonary Edema - required intubation on 3/31, aggressively diuresed, extubated 4/1. Acute on Chronic diastolic congestive heart failure  SVT  / V Tach A-Fib with RVR Demand Ischemia Hx of Paroxysmal A-fib --Cardiology following - see ercs --Off amio drip 4/3 --Amiodarone 200 mg PO BID x 10 days, then 200 mg daily --Eliquis for stroke risk reduction --Lipitor --Metoprolol was stopped --Daily weights & strict Io's --IV Lasix 40 mg this AM -- monitor response. Hold PO Lasix. --Monitor renal function, electrolytes  --RHC cath 4/2 - see report     Atrial fibrillation with slow ventricular response (HCC) --Management per Cardiology --Continue  Eliquis --Metoprolol was stopped, now resumed --Transitioned amiodarone drip >> PO yesterday, 200 mg BID x 10 days, then 200 mg daily --Telemetry --Maintain K>4, Mg>2     Suicidal ideations - POA Depression and Anxiety Psychiatry consulted 3/28 - recommended inpatient psych admission when medically cleared.  IVC was only recommended in the event pt attempted to leave the hospital, but appears patient is still under IVC. --One-to-one observation.   --Psychiatry to re-assess this afternoon --On Buspar 10 mg BID, Prozac 20 mg daily, Trazodone at bedtime --Remeron 7.5 mg at bedtime was added by psychiatry     Metabolic acidosis - resolved --Monitor BMP   Hypophosphatemia Replaced   Hypomagnesemia Replaced   Hypokalemia Replaced   UTI (urinary tract infection) Urine culture only 20k colonies E. Coli   Prolonged QT interval Avoid QT prolonging drugs Serial ekg's Maintain K>4, Mg>2 Telemetry   Diarrhea Stool studies negative   Other specified hypothyroidism Both TSH and T4 elevated --Continue Synthroid 25 mcg     Pulmonary hypertension  Control blood pressure. O2 per protocol. Monitor hemodynamics closely.   Failure to thrive Poor PO intake --Palliative care consulted for GOC and code status discussions, appreciate assistance           Consultants: Cardiology, PCCM, Palliative Care Procedures performed: as above Disposition: Hospice care Diet recommendation:  Discharge Diet Orders (From admission, onward)     Start     Ordered  05/05/23 0000  Diet -      regular   05/05/23 1827            DISCHARGE MEDICATION: Allergies as of 05/05/2023       Reactions   Bextra  [valdecoxib]    Compazine [prochlorperazine Edisylate]    Stroke-like symptoms   Lithium Carbonate    Leg weakness   Lyrica [pregabalin]         Medication List     STOP taking these medications    amiodarone 200 MG tablet Commonly known as: PACERONE   apixaban 5 MG  Tabs tablet Commonly known as: ELIQUIS   atorvastatin 40 MG tablet Commonly known as: LIPITOR   busPIRone 10 MG tablet Commonly known as: BUSPAR   Calmoseptine 0.44-20.6 % Oint Generic drug: Menthol-Zinc Oxide   camphor-menthol lotion Commonly known as: SARNA   FLUoxetine 10 MG tablet Commonly known as: PROZAC   furosemide 40 MG tablet Commonly known as: LASIX   gabapentin 100 MG capsule Commonly known as: NEURONTIN   guaiFENesin 600 MG 12 hr tablet Commonly known as: MUCINEX   lamoTRIgine 25 MG tablet Commonly known as: LAMICTAL   levothyroxine 25 MCG tablet Commonly known as: SYNTHROID   LORazepam 0.5 MG tablet Commonly known as: ATIVAN Replaced by: LORazepam 2 MG/ML injection   melatonin 5 MG Tabs   metoprolol tartrate 25 MG tablet Commonly known as: LOPRESSOR   mirtazapine 15 MG tablet Commonly known as: REMERON   montelukast 10 MG tablet Commonly known as: SINGULAIR   Nyamyc powder Generic drug: nystatin   polyethylene glycol 17 g packet Commonly known as: MIRALAX / GLYCOLAX   traMADol 50 MG tablet Commonly known as: ULTRAM   traZODone 50 MG tablet Commonly known as: DESYREL   Trelegy Ellipta 200-62.5-25 MCG/ACT Aepb Generic drug: Fluticasone-Umeclidin-Vilant       TAKE these medications    acetaminophen 325 MG tablet Commonly known as: TYLENOL Take 2 tablets (650 mg total) by mouth every 6 (six) hours as needed for mild pain (pain score 1-3) (or Fever >/= 101). What changed:  medication strength how much to take when to take this reasons to take this   diphenhydrAMINE 50 MG/ML injection Commonly known as: BENADRYL Inject 0.5 mLs (25 mg total) into the vein every 4 (four) hours as needed for itching.   docusate sodium 100 MG capsule Commonly known as: COLACE Take 1 capsule (100 mg total) by mouth 2 (two) times daily.   feeding supplement Liqd Take 237 mLs by mouth 3 (three) times daily between meals.   fluticasone  furoate-vilanterol 200-25 MCG/ACT Aepb Commonly known as: BREO ELLIPTA Inhale 1 puff into the lungs daily. Start taking on: May 06, 2023   glycopyrrolate 1 MG tablet Commonly known as: ROBINUL Take 1 tablet (1 mg total) by mouth every 4 (four) hours as needed (excessive secretions).   haloperidol 2 MG tablet Commonly known as: HALDOL Take 1 tablet (2 mg total) by mouth every 6 (six) hours as needed for agitation (or delirium).   ipratropium-albuterol 0.5-2.5 (3) MG/3ML Soln Commonly known as: DUONEB Take 3 mLs by nebulization every 4 (four) hours as needed.   LORazepam 2 MG/ML injection Commonly known as: ATIVAN Inject 0.5 mLs (1 mg total) into the vein every hour as needed for seizure or anxiety. Replaces: LORazepam 0.5 MG tablet   morphine (PF) 2 MG/ML injection Inject 0.5-2 mLs (1-4 mg total) into the vein every 15 (fifteen) minutes as needed (To alleviate signs and symptoms  of distress).   mouth rinse Liqd solution 15 mLs by Mouth Rinse route as needed (for oral care).   antiseptic oral rinse Liqd Apply 15 mLs topically 2 (two) times daily.   ondansetron 4 MG disintegrating tablet Commonly known as: ZOFRAN-ODT Take 1 tablet (4 mg total) by mouth every 6 (six) hours as needed for nausea.   polyvinyl alcohol 1.4 % ophthalmic solution Commonly known as: LIQUIFILM TEARS Place 1 drop into both eyes 4 (four) times daily as needed for dry eyes.   umeclidinium bromide 62.5 MCG/ACT Aepb Commonly known as: INCRUSE ELLIPTA Inhale 1 puff into the lungs daily. Start taking on: May 06, 2023               Discharge Care Instructions  (From admission, onward)           Start     Ordered   05/05/23 0000  Discharge wound care:       Comments: As above   05/05/23 1827            Contact information for follow-up providers     Custovic, Sabina, DO. Go in 1 week(s).   Specialty: Cardiology Why: Appointment scheduled on 05/09/2023 at 10:30 AM with Dr.  Barry Brunner information: [address] [place] [address] [phone]              Contact information for after-discharge care     Destination     [facility] .   Service: Skilled Nursing Contact information: [address] [place] [address] [phone]                    Discharge Exam: Filed Weights   05/03/23 0500 05/04/23 0500 05/05/23 0500  Weight: 58.7 kg 58.2 kg 58.2 kg   General exam: drowsy appearing, no acute distress, frail and chronically ill appearing Respiratory system: lung sounds improved since this morning, no wheezes, using accessory muscles on 3 L.min Kerr O2 Cardiovascular system: normal S1/S2, RRR, no pedal edema.   Gastrointestinal system: soft, NT, ND Central nervous system: no gross focal neurologic deficits, normal speech Extremities: moves all, no edema, normal tone Skin: dry, intact, normal temperature Psychiatry: depressed mood, congruent affect  Condition at discharge: stable  The results of significant diagnostics from this hospitalization (including imaging, microbiology, ancillary and laboratory) are listed below for reference.   Imaging Studies: DG Chest Port 1 View Result Date: 05/04/2023 CLINICAL DATA:  78 year old female with shortness of breath. EXAM: PORTABLE CHEST 1 VIEW COMPARISON:  Portable chest yesterday and earlier. FINDINGS: Portable AP view at 1012 hours. Mildly lower lung volumes. Stable cardiac size and mediastinal contours. Coarse bilateral interstitial opacity persists, and there is increasing bilateral lung base opacity compared to yesterday, partially veiling. No air bronchograms identified. Stable visualized osseous structures. Cervical ACDF. Stable visible bowel gas. IMPRESSION: Continued coarse bilateral pulmonary interstitial opacity and increased confluent/veiling bilateral lung base opacity since yesterday. Consider increasing pleural effusions,  atelectasis, infection. Electronically Signed   By: Odessa Fleming M.D.   On: 05/04/2023 12:46   DG Chest Port 1 View Result Date: 05/03/2023 CLINICAL DATA:  78 year old female with shortness of breath. EXAM: PORTABLE CHEST 1 VIEW COMPARISON:  Portable chest 04/29/2023 and earlier. FINDINGS: Portable AP upright view at 0925 hours. Extubated and enteric tube removed. Stable lung volumes. Mediastinal contours are within normal limits. Coarse bilateral, fairly symmetric pulmonary interstitial opacity now. Confluent and veiling left lung base opacity, not significantly  changed from 04/28/2023. Right lung ventilation mildly improved since that time. No pneumothorax. No convincing pulmonary edema. No acute osseous abnormality identified. Negative visible bowel gas. Cervical ACDF partially visible. IMPRESSION: 1. Extubated and enteric tube removed. 2. Stable lung volumes. Ongoing coarse bilateral pulmonary interstitial opacity and lung base hypo ventilation with suspicion of small pleural effusion(s). But ventilation improved since 04/28/2023. Electronically Signed   By: Odessa Fleming M.D.   On: 05/03/2023 12:33   CARDIAC CATHETERIZATION Result Date: 04/30/2023   Hemodynamic findings consistent with moderate pulmonary hypertension. 1.  Moderate pulmonary hypertension, PA mean 42 mmHg 2.  PCWP 28 mmHg 3.  Fick cardiac output 4.57 L/min   DG Chest Port 1 View Result Date: 04/29/2023 CLINICAL DATA:  78 year old female with respiratory failure. Intubated. EXAM: PORTABLE CHEST 1 VIEW COMPARISON:  Portable chest yesterday and earlier. FINDINGS: Portable AP view at 0511 hours. Mildly more rotated to the right. Endotracheal tube tip in good position between the clavicles and carina. Visible enteric tube loops in the stomach. Mediastinal contours remain within normal limits. Lung volumes slightly larger. Coarse bilateral pulmonary interstitial opacity with vague, confluent lung base opacity continues. But mildly improved bilateral  ventilation since yesterday. No pneumothorax. No areas of worsening ventilation. Stable visualized osseous structures. Paucity of bowel gas. IMPRESSION: 1. Satisfactory endotracheal and enteric tubes. 2. Mildly improved bilateral lung volumes and ventilation since yesterday. Ongoing widespread coarse interstitial and more confluent bilateral lung base opacity. Electronically Signed   By: Odessa Fleming M.D.   On: 04/29/2023 07:59   ECHOCARDIOGRAM COMPLETE Result Date: 04/28/2023    ECHOCARDIOGRAM REPORT   Patient Name:   MAFALDA MCGINNISS Date of Exam: 04/28/2023 Medical Rec #:  [id number]        Height:       65.0 in Accession #:    [id number]       Weight:       120.0 lb Date of Birth:  Aug 20, 1945        BSA:          1.592 m Patient Age:    77 years         BP:           132/64 mmHg Patient Gender: F                HR:           65 bpm. Exam Location:  [facility] Procedure: 2D Echo, Cardiac Doppler and Color Doppler (Both Spectral and Color            Flow Doppler were utilized during procedure). Indications:     Elevated Troponin  History:         Patient has prior history of Echocardiogram examinations, most                  recent 09/09/2022. COPD and Stroke, Arrythmias:Atrial                  Fibrillation, Signs/Symptoms:Dyspnea; Risk                  Factors:Hypertension, Dyslipidemia and Current Smoker. CKD.  Sonographer:     Mikki Harbor Referring Phys:  [id number] Andris Baumann Diagnosing Phys: Yvonne Kendall MD  Sonographer Comments: Echo performed with patient supine and on artificial respirator. Image acquisition challenging due to respiratory motion. IMPRESSIONS  1. Left ventricular ejection fraction, by estimation, is 55 to 60%. The left ventricle has normal function. The left ventricle has no  regional wall motion abnormalities. Left ventricular diastolic parameters were normal.  2. Right ventricular systolic function is mildly reduced. The right ventricular size is normal. Mildly increased right ventricular  wall thickness. There is severely elevated pulmonary artery systolic pressure.  3. Left atrial size was moderately dilated.  4. Moderate pleural effusion in the left lateral region.  5. The mitral valve is abnormal. Moderate to severe mitral valve regurgitation.  6. Tricuspid valve regurgitation is moderate to severe.  7. The aortic valve is tricuspid. There is mild calcification of the aortic valve. There is mild thickening of the aortic valve. Aortic valve regurgitation is mild. Aortic valve sclerosis/calcification is present, without any evidence of aortic stenosis.  8. The inferior vena cava is dilated in size with <50% respiratory variability, suggesting right atrial pressure of 15 mmHg. FINDINGS  Left Ventricle: Left ventricular ejection fraction, by estimation, is 55 to 60%. The left ventricle has normal function. The left ventricle has no regional wall motion abnormalities. The left ventricular internal cavity size was normal in size. There is  borderline left ventricular hypertrophy. Left ventricular diastolic parameters were normal. Right Ventricle: The right ventricular size is normal. Mildly increased right ventricular wall thickness. Right ventricular systolic function is mildly reduced. There is severely elevated pulmonary artery systolic pressure. The tricuspid regurgitant velocity is 3.64 m/s, and with an assumed right atrial pressure of 15 mmHg, the estimated right ventricular systolic pressure is 68.0 mmHg. Left Atrium: Left atrial size was moderately dilated. Right Atrium: Right atrial size was normal in size. Pericardium: Trivial pericardial effusion is present. Mitral Valve: The mitral valve is abnormal. Mild to moderate mitral annular calcification. Moderate to severe mitral valve regurgitation. MV peak gradient, 7.1 mmHg. The mean mitral valve gradient is 2.0 mmHg. Tricuspid Valve: The tricuspid valve is normal in structure. Tricuspid valve regurgitation is moderate to severe. Aortic Valve:  The aortic valve is tricuspid. There is mild calcification of the aortic valve. There is mild thickening of the aortic valve. Aortic valve regurgitation is mild. Aortic valve sclerosis/calcification is present, without any evidence of aortic stenosis. Aortic valve mean gradient measures 4.0 mmHg. Aortic valve peak gradient measures 8.0 mmHg. Aortic valve area, by VTI measures 2.04 cm. Pulmonic Valve: The pulmonic valve was not well visualized. Pulmonic valve regurgitation is not visualized. No evidence of pulmonic stenosis. Aorta: The aortic root and ascending aorta are structurally normal, with no evidence of dilitation. Venous: The inferior vena cava is dilated in size with less than 50% respiratory variability, suggesting right atrial pressure of 15 mmHg. IAS/Shunts: No atrial level shunt detected by color flow Doppler. Additional Comments: There is a moderate pleural effusion in the left lateral region.  LEFT VENTRICLE PLAX 2D LVIDd:         4.70 cm     Diastology LVIDs:         3.30 cm     LV e' medial:    8.59 cm/s LV PW:         1.13 cm     LV E/e' medial:  13.5 LV IVS:        0.80 cm     LV e' lateral:   13.30 cm/s LVOT diam:     1.90 cm     LV E/e' lateral: 8.7 LV SV:         67 LV SV Index:   42 LVOT Area:     2.84 cm  LV Volumes (MOD) LV vol d, MOD A2C: 53.5 ml LV  vol d, MOD A4C: 57.5 ml LV vol s, MOD A2C: 20.6 ml LV vol s, MOD A4C: 25.1 ml LV SV MOD A2C:     32.9 ml LV SV MOD A4C:     57.5 ml LV SV MOD BP:      31.8 ml RIGHT VENTRICLE RV Basal diam:  3.00 cm RV Mid diam:    2.30 cm LEFT ATRIUM             Index        RIGHT ATRIUM           Index LA diam:        4.20 cm 2.64 cm/m   RA Area:     14.70 cm LA Vol (A2C):   81.4 ml 51.13 ml/m  RA Volume:   35.50 ml  22.30 ml/m LA Vol (A4C):   76.5 ml 48.05 ml/m LA Biplane Vol: 79.6 ml 50.00 ml/m  AORTIC VALVE                    PULMONIC VALVE AV Area (Vmax):    2.21 cm     PV Vmax:       0.72 m/s AV Area (Vmean):   2.11 cm     PV Peak grad:  2.1  mmHg AV Area (VTI):     2.04 cm AV Vmax:           141.00 cm/s AV Vmean:          91.550 cm/s AV VTI:            0.328 m AV Peak Grad:      8.0 mmHg AV Mean Grad:      4.0 mmHg LVOT Vmax:         110.00 cm/s LVOT Vmean:        68.000 cm/s LVOT VTI:          0.236 m LVOT/AV VTI ratio: 0.72  AORTA Ao Root diam: 3.30 cm Ao Asc diam:  3.20 cm MITRAL VALVE                  TRICUSPID VALVE MV Area (PHT): 5.54 cm       TR Peak grad:   53.0 mmHg MV Area VTI:   1.91 cm       TR Vmax:        364.00 cm/s MV Peak grad:  7.1 mmHg MV Mean grad:  2.0 mmHg       SHUNTS MV Vmax:       1.33 m/s       Systemic VTI:  0.24 m MV Vmean:      56.9 cm/s      Systemic Diam: 1.90 cm MV Decel Time: 137 msec MR Peak grad:    145.9 mmHg MR Mean grad:    91.0 mmHg MR Vmax:         604.00 cm/s MR Vmean:        449.0 cm/s MR PISA:         2.26 cm MR PISA Eff ROA: 35 mm MR PISA Radius:  0.60 cm MV E velocity: 116.00 cm/s MV A velocity: 69.70 cm/s MV E/A ratio:  1.66 Cristal Deer End MD Electronically signed by Yvonne Kendall MD Signature Date/Time: 04/28/2023/7:14:48 PM    Final    DG Chest Port 1 View Result Date: 04/28/2023 CLINICAL DATA:  Intubation EXAM: PORTABLE CHEST 1 VIEW COMPARISON:  Two days ago FINDINGS: Diffuse interstitial opacity with patchy  airspace density greatest at the right base, progressed at the right base. There is small volume pleural fluid likely. No pneumothorax. Endotracheal tube with tip between the clavicular heads and carina. An enteric tube reaches the stomach at least. Normal heart size IMPRESSION: Worsening infiltrate especially at the right lung base. New hardware in unremarkable position. Electronically Signed   By: Tiburcio Pea M.D.   On: 04/28/2023 10:26   DG Abd 1 View Result Date: 04/28/2023 CLINICAL DATA:  NG placement. EXAM: ABDOMEN - 1 VIEW COMPARISON:  None Available. FINDINGS: Enteric tube with tip in the region of the distal stomach. No bowel dilatation noted. Small bilateral pleural  effusions and diffuse interstitial and bibasilar densities. IMPRESSION: Enteric tube with tip in the distal stomach. Electronically Signed   By: Elgie Collard M.D.   On: 04/28/2023 10:23   DG Chest Port 1 View Result Date: 04/26/2023 CLINICAL DATA:  Respiratory failure EXAM: PORTABLE CHEST 1 VIEW COMPARISON:  04/26/2023 at 9:13 a.m. FINDINGS: Increased predominantly interstitial opacities bilaterally greatest in the left mid/upper lung. No pleural effusion or pneumothorax. Stable cardiomediastinal silhouette. Aortic atherosclerotic calcification. IMPRESSION: Increased predominantly interstitial opacities bilaterally greatest in the left mid/upper lung. Findings favor pneumonia superimposed on a background emphysema. Electronically Signed   By: Minerva Fester M.D.   On: 04/26/2023 23:49   DG Chest Port 1 View Result Date: 04/26/2023 CLINICAL DATA:  Cough. Recent antibiotics for pneumonia with resulting diarrhea. History of CVA and atrial fibrillation. EXAM: PORTABLE CHEST 1 VIEW COMPARISON:  None recent. Chest CT 09/13/2022. Radiographs 09/13/2022 and 08/25/2022. FINDINGS: 0913 hours. The heart size and mediastinal contours are stable with mild aortic atherosclerosis. The aeration of the lungs has improved compared with the available prior studies. There are patchy pulmonary opacities bilaterally which could reflect postinflammatory scarring or recurrent infection. There is no consolidation, significant pleural effusion or pneumothorax. The bones appear unchanged status post lower cervical fusion. IMPRESSION: Overall improvement in bilateral airspace opacities compared with prior studies from August. Patchy pulmonary opacities bilaterally which could reflect postinflammatory scarring or recurrent infection. No consolidation or pleural effusion. Electronically Signed   By: Carey Bullocks M.D.   On: 04/26/2023 13:05    Microbiology: Results for orders placed or performed during the hospital encounter of  04/25/23  Urine Culture (for pregnant, neutropenic or urologic patients or patients with an indwelling urinary catheter)     Status: Abnormal   Collection Time: 04/24/23 11:21 PM   Specimen: Urine, Clean Catch  Result Value Ref Range Status   Specimen Description   Final    URINE, CLEAN CATCH Performed at [facility], [address] [place], [place] [address]    Special Requests   Final    NONE Performed at [facility], [address] [place], [place] [address]    Culture (A)  Final    20,000 COLONIES/mL ESCHERICHIA COLI Confirmed Extended Spectrum Beta-Lactamase Producer (ESBL).  In bloodstream infections from ESBL organisms, carbapenems are preferred over piperacillin/tazobactam. They are shown to have a lower risk of mortality.    Report Status 04/27/2023 FINAL  Final   Organism ID, Bacteria ESCHERICHIA COLI (A)  Final      Susceptibility   Escherichia coli - MIC*    AMPICILLIN >=32 RESISTANT Resistant     CEFAZOLIN >=64 RESISTANT Resistant     CEFEPIME >=32 RESISTANT Resistant     CEFTRIAXONE >=64 RESISTANT Resistant     CIPROFLOXACIN >=4 RESISTANT Resistant     GENTAMICIN <=1 SENSITIVE Sensitive  IMIPENEM <=0.25 SENSITIVE Sensitive     NITROFURANTOIN <=16 SENSITIVE Sensitive     TRIMETH/SULFA >=320 RESISTANT Resistant     AMPICILLIN/SULBACTAM >=32 RESISTANT Resistant     PIP/TAZO 64 INTERMEDIATE Intermediate ug/mL    * 20,000 COLONIES/mL ESCHERICHIA COLI  Gastrointestinal Panel by PCR , Stool     Status: None   Collection Time: 04/25/23  6:42 AM   Specimen: Stool  Result Value Ref Range Status   Campylobacter species NOT DETECTED NOT DETECTED Final   Plesimonas shigelloides NOT DETECTED NOT DETECTED Final   Salmonella species NOT DETECTED NOT DETECTED Final   Yersinia enterocolitica NOT DETECTED NOT DETECTED Final   Vibrio species NOT DETECTED NOT DETECTED Final   Vibrio cholerae NOT DETECTED NOT DETECTED Final   Enteroaggregative E coli  (EAEC) NOT DETECTED NOT DETECTED Final   Enteropathogenic E coli (EPEC) NOT DETECTED NOT DETECTED Final   Enterotoxigenic E coli (ETEC) NOT DETECTED NOT DETECTED Final   Shiga like toxin producing E coli (STEC) NOT DETECTED NOT DETECTED Final   Shigella/Enteroinvasive E coli (EIEC) NOT DETECTED NOT DETECTED Final   Cryptosporidium NOT DETECTED NOT DETECTED Final   Cyclospora cayetanensis NOT DETECTED NOT DETECTED Final   Entamoeba histolytica NOT DETECTED NOT DETECTED Final   Giardia lamblia NOT DETECTED NOT DETECTED Final   Adenovirus F40/41 NOT DETECTED NOT DETECTED Final   Astrovirus NOT DETECTED NOT DETECTED Final   Norovirus GI/GII NOT DETECTED NOT DETECTED Final   Rotavirus A NOT DETECTED NOT DETECTED Final   Sapovirus (I, II, IV, and V) NOT DETECTED NOT DETECTED Final    Comment: Performed at [facility], [address] [place], [place] [address]  C Difficile Quick Screen w PCR reflex     Status: None   Collection Time: 04/25/23  6:42 AM   Specimen: STOOL  Result Value Ref Range Status   C Diff antigen NEGATIVE NEGATIVE Final   C Diff toxin NEGATIVE NEGATIVE Final   C Diff interpretation No C. difficile detected.  Final    Comment: Performed at [facility], [address] [place], [place] [address]  Culture, blood (Routine X 2) w Reflex to ID Panel     Status: None   Collection Time: 04/26/23 10:22 PM   Specimen: BLOOD RIGHT ARM  Result Value Ref Range Status   Specimen Description BLOOD RIGHT ARM  Final   Special Requests   Final    BOTTLES DRAWN AEROBIC AND ANAEROBIC Blood Culture adequate volume   Culture   Final    NO GROWTH 5 DAYS Performed at [facility], [address] [place], [place] [address]    Report Status 05/01/2023 FINAL  Final  Culture, blood (Routine X 2) w Reflex to ID Panel     Status: None   Collection Time: 04/26/23 10:37 PM   Specimen: BLOOD LEFT HAND  Result Value Ref Range Status   Specimen Description BLOOD  LEFT HAND  Final   Special Requests   Final    BOTTLES DRAWN AEROBIC AND ANAEROBIC Blood Culture results may not be optimal due to an inadequate volume of blood received in culture bottles   Culture   Final    NO GROWTH 5 DAYS Performed at [facility], [address], [place], [place] [address]    Report Status 05/01/2023 FINAL  Final  Resp panel by RT-PCR (RSV, Flu A&B, Covid) Anterior Nasal Swab     Status: None   Collection Time: 04/26/23 11:42 PM  Specimen: Anterior Nasal Swab  Result Value Ref Range Status   SARS Coronavirus 2 by RT PCR NEGATIVE NEGATIVE Final    Comment: (NOTE) SARS-CoV-2 target nucleic acids are NOT DETECTED.  The SARS-CoV-2 RNA is generally detectable in upper respiratory specimens during the acute phase of infection. The lowest concentration of SARS-CoV-2 viral copies this assay can detect is 138 copies/mL. A negative result does not preclude SARS-Cov-2 infection and should not be used as the sole basis for treatment or other patient management decisions. A negative result may occur with  improper specimen collection/handling, submission of specimen other than nasopharyngeal swab, presence of viral mutation(s) within the areas targeted by this assay, and inadequate number of viral copies(<138 copies/mL). A negative result must be combined with clinical observations, patient history, and epidemiological information. The expected result is Negative.  Fact Sheet for Patients:  BloggerCourse.com  Fact Sheet for Healthcare Providers:  SeriousBroker.it  This test is no t yet approved or cleared by the [place] FDA and  has been authorized for detection and/or diagnosis of SARS-CoV-2 by FDA under an Emergency Use Authorization (EUA). This EUA will remain  in effect (meaning this test can be used) for the duration of the COVID-19 declaration under Section 564(b)(1) of the Act, 21 U.S.C.section  360bbb-3(b)(1), unless the authorization is terminated  or revoked sooner.       Influenza A by PCR NEGATIVE NEGATIVE Final   Influenza B by PCR NEGATIVE NEGATIVE Final    Comment: (NOTE) The Xpert Xpress SARS-CoV-2/FLU/RSV plus assay is intended as an aid in the diagnosis of influenza from Nasopharyngeal swab specimens and should not be used as a sole basis for treatment. Nasal washings and aspirates are unacceptable for Xpert Xpress SARS-CoV-2/FLU/RSV testing.  Fact Sheet for Patients: BloggerCourse.com  Fact Sheet for Healthcare Providers: SeriousBroker.it  This test is not yet approved or cleared by the [place] FDA and has been authorized for detection and/or diagnosis of SARS-CoV-2 by FDA under an Emergency Use Authorization (EUA). This EUA will remain in effect (meaning this test can be used) for the duration of the COVID-19 declaration under Section 564(b)(1) of the Act, 21 U.S.C. section 360bbb-3(b)(1), unless the authorization is terminated or revoked.     Resp Syncytial Virus by PCR NEGATIVE NEGATIVE Final    Comment: (NOTE) Fact Sheet for Patients: BloggerCourse.com  Fact Sheet for Healthcare Providers: SeriousBroker.it  This test is not yet approved or cleared by the [place] FDA and has been authorized for detection and/or diagnosis of SARS-CoV-2 by FDA under an Emergency Use Authorization (EUA). This EUA will remain in effect (meaning this test can be used) for the duration of the COVID-19 declaration under Section 564(b)(1) of the Act, 21 U.S.C. section 360bbb-3(b)(1), unless the authorization is terminated or revoked.  Performed at [facility], [address] [place], [place] [address]   MRSA Next Gen by PCR, Nasal     Status: None   Collection Time: 04/27/23  8:27 AM   Specimen: Nasal Mucosa; Nasal Swab  Result Value Ref Range  Status   MRSA by PCR Next Gen NOT DETECTED NOT DETECTED Final    Comment: (NOTE) The GeneXpert MRSA Assay (FDA approved for NASAL specimens only), is one component of a comprehensive MRSA colonization surveillance program. It is not intended to diagnose MRSA infection nor to guide or monitor treatment for MRSA infections. Test performance is not FDA approved in patients less than 55 years old. Performed at [facility], [address] [place], [place] [address]   Respiratory (~20 pathogens) panel by PCR     Status: Abnormal   Collection Time: 04/28/23  9:53 AM   Specimen: Nasopharyngeal Swab; Respiratory  Result Value Ref Range Status   Adenovirus NOT DETECTED NOT DETECTED Final   Coronavirus 229E NOT DETECTED NOT DETECTED Final    Comment: (NOTE) The Coronavirus on the Respiratory Panel, DOES NOT test for the novel  Coronavirus (2019 nCoV)    Coronavirus HKU1 NOT DETECTED NOT DETECTED Final   Coronavirus NL63 NOT DETECTED NOT DETECTED Final   Coronavirus OC43 DETECTED (A) NOT DETECTED Final   Metapneumovirus NOT DETECTED NOT DETECTED Final   Rhinovirus / Enterovirus NOT DETECTED NOT DETECTED Final   Influenza A NOT DETECTED NOT DETECTED Final   Influenza B NOT DETECTED NOT DETECTED Final   Parainfluenza Virus 1 NOT DETECTED NOT DETECTED Final   Parainfluenza Virus 2 NOT DETECTED NOT DETECTED Final   Parainfluenza Virus 3 NOT DETECTED NOT DETECTED Final   Parainfluenza Virus 4 NOT DETECTED NOT DETECTED Final   Respiratory Syncytial Virus NOT DETECTED NOT DETECTED Final   Bordetella pertussis NOT DETECTED NOT DETECTED Final   Bordetella Parapertussis NOT DETECTED NOT DETECTED Final   Chlamydophila pneumoniae NOT DETECTED NOT DETECTED Final   Mycoplasma pneumoniae NOT DETECTED NOT DETECTED Final    Comment: Performed at [facility], [address], [place], [place] [address]  Culture, Respiratory w Gram Stain     Status: None   Collection Time: 04/28/23  9:52 PM    Specimen: Tracheal Aspirate; Respiratory  Result Value Ref Range Status   Specimen Description   Final    TRACHEAL ASPIRATE Performed at [facility], [address] [place], [place] [address]    Special Requests   Final    NONE Performed at [facility], [address] [place], [place] [address]    Gram Stain   Final    FEW WBC PRESENT,BOTH PMN AND MONONUCLEAR NO ORGANISMS SEEN    Culture   Final    NO GROWTH 2 DAYS Performed at [facility], [address], [place], [place] [address]    Report Status 05/01/2023 FINAL  Final    Labs: CBC: Recent Labs  Lab 05/01/23 0531 05/01/23 1917 05/02/23 0419 05/03/23 0610 05/04/23 0404 05/05/23 0713  WBC 10.4  --  9.0 11.6* 12.4* 14.4*  HGB 8.5* 9.2* 9.1* 8.4* 9.7* 9.8*  HCT 26.4* 28.6* 27.9* 26.3* 30.4* 29.9*  MCV 92.3  --  93.0 92.3 93.0 91.7  PLT 165  --  163 180 218 242   Basic Metabolic Panel: Recent Labs  Lab 04/30/23 0321 04/30/23 0322 05/01/23 0531 05/02/23 0419 05/03/23 0610 05/04/23 0404 05/05/23 0713  NA  --    < > 143 142 142 142 143  K  --    < > 3.5 4.2 3.4* 4.8 4.3  CL  --    < > 106 106 105 108 104  CO2  --    < > 30 29 30 28 30   GLUCOSE  --    < > 175* 143* 91 101* 99  BUN  --    < > 21 23 21 18 21   CREATININE  --    < > 0.65 0.87 0.89 0.72 0.82  CALCIUM  --    < > 8.9 8.7* 8.5* 9.0 9.1  MG 2.2  --  2.6* 2.1 2.2  --  2.3  PHOS  --    < >  2.6 2.9 3.2 3.0 3.3   < > = values in this interval not displayed.   Liver Function Tests: Recent Labs  Lab 05/01/23 0531 05/02/23 0419 05/03/23 0610 05/04/23 0404 05/05/23 0713  ALBUMIN 2.2* 2.3* 2.1* 2.4* 2.5*   CBG: Recent Labs  Lab 05/04/23 1610 05/04/23 1956 05/05/23 0023 05/05/23 0412 05/05/23 0851  GLUCAP 133* 158* 166* 120* 87    Discharge time spent: less than 30 minutes.  Signed: Pennie Banter, DO [facility] 05/05/2023

## 2023-05-05 NOTE — TOC Transition Note (Signed)
 Transition of Care [facility]) - Discharge Note   Patient Details  Name: Beth [name] MRN: [id number] Date of Birth: Jul 15, 1945  [facility] CM/SW Contact:  Truddie Hidden, RN Phone Number: 05/05/2023, 4:21 PM   Clinical Narrative:    Patient will discharge to the hospice IPU today. Ree Kida from [facility] will arranged EMS. Face sheet and medical necessity forms printed to the floor to be added to the EMS packet.    TOC signing off       Barriers to Discharge: Continued Medical Work up   Patient Goals and CMS Choice   CMS Medicare.gov Compare Post Acute Care list provided to:: Patient Represenative (must comment) Choice offered to / list presented to : Adult Children      Discharge Placement                       Discharge Plan and Services Additional resources added to the After Visit Summary for                                       Social Drivers of Health (SDOH) Interventions SDOH Screenings   Food Insecurity: No Food Insecurity (04/25/2023)  Housing: Low Risk  (04/25/2023)  Transportation Needs: Unmet Transportation Needs (04/25/2023)  Utilities: Not At Risk (04/25/2023)  Alcohol Screen: Low Risk  (02/01/2022)  Depression (PHQ2-9): High Risk (05/13/2022)  Financial Resource Strain: Low Risk  (02/01/2022)  Physical Activity: Insufficiently Active (02/01/2022)  Social Connections: Socially Isolated (04/25/2023)  Stress: No Stress Concern Present (02/01/2022)  Tobacco Use: High Risk (04/24/2023)  Health Literacy: Adequate Health Literacy (09/16/2022)     Readmission Risk Interventions    04/26/2023    4:30 PM 09/09/2022   12:06 PM  Readmission Risk Prevention Plan  Transportation Screening Complete Complete  PCP or Specialist Appt within 3-5 Days Complete   HRI or Home Care Consult Complete   Social Work Consult for Recovery Care Planning/Counseling Complete   Palliative Care Screening Not Applicable   Medication Review Oceanographer) Complete  Referral to Pharmacy  PCP or Specialist appointment within 3-5 days of discharge  Complete  HRI or Home Care Consult  Complete  SW Recovery Care/Counseling Consult  Complete  Palliative Care Screening  Complete  Skilled Nursing Facility  Complete

## 2023-05-05 NOTE — Progress Notes (Signed)
 Progress Note   Patient: Beth [name] YQM:[id number] DOB: [birth year] DOA: 04/25/2023     10 DOS: the patient was seen and examined on 05/05/2023   Brief hospital course: 78 y.o. female with medical history significant for CVA with left-sided residual weakness, depression, anxiety, neuropathy, insomnia, atrial fibrillation on Eliquis, COPD on home O2 at 3 L who was brought to the ED after she tried to stab herself with scissors.  She endorses feeling suicidal.  Patient recently recovered from a bout of pneumonia, completing antibiotic treatment.  She did endorse diarrhea in the few days prior.  She has had very poor oral intake. ED course and data review: BP slightly elevated on arrival at 170/72, pulse in the 50s otherwise normal vitals Labs notable for potassium of 2.2 and magnesium 1.6.  TSH slightly elevated at 5.528.  Free T4 also elevated at 1.66 Urinalysis with moderate leukocytes CBC WNL, BMP except for potassium WNL. UDS, salicylate, EtOH and acetaminophen levels within normal parameters EKG, personally reviewed and interpreted showing sinus bradycardia at 51 and prolonged QT to 648   Patient was started on ceftriaxone for possibility of UTI.  Also given oral and IV potassium repletion.   Seen by behavioral health,    Hospitalist consulted for admission.   3/28.  Potassium still low this morning IV and oral potassium ordered.  3/29.  Psychiatry changed her medications over to Remeron at night secondary to prolonged QT.  Electrolytes much improved.  Patient felt short of breath this morning.  Chest x-ray showing overall improvement in bilateral airspace opacities from prior studies.  Patient had worsening shortness of breath and was acidotic and sent to the ICU for BiPAP and started on aggressive antibiotics and steroids.  3/30.  Patient did not like the BiPAP mask.  Placed back on nasal cannula.  On IV fluids.  Continue Zosyn.  Discontinue vancomycin with MRSA PCR being negative.   Continue Solu-Medrol.  3/31 -- pt acutely decompensated with flash pulmonary edema, in addition to SVT/VT followed by A-fib with RVR.  Pt had to emergently intubated to transferred to ICU.  In ICU, Cardiology was consulted.  Patient was aggressively diuresed and placed on IV amiodarone. Tested + for non-Covid coronoavirus OC43. Extubated 4/1.  4/2 -- TRH resumed care, pt extubated yesterday.   RHC cath today, see report and Cardiology recommendations.  Further hospital course and management as outlined below.  In summary, hospital course further prolonged and complicated by recurrent pulmonary edema and respiratory decompensation in the setting of severe valvular heart disease, ongoing poor PO intake and failure to thrive.   Palliative care has been involved with goals of care discussions.  4/7 -- decision made to transition to comfort care   Assessment and Plan:   Comfort Care Measures Only -- as of 4/7 AM --Hospice liaison notified --Appreicate Palliative Care's assistance in GOC talks and comfort medications --Notify provider if any uncontrolled signs of pain, distress, discomfort, anxiety, agitation, dyspnea etc.  --Comfort care per orders PRN   ======================================= A&P prior to transition to comfort care:  Acute on chronic respiratory failure with hypoxia and hypercapnia  Required BiPAP >> emergent intubation on 3/31, extubated 4/1 after aggressive diuresis for flash pulmonary edema --Mgmt of underlying conditions as below --O2 per protocol, wean as tolerated --BiPAP PRN --Pulmonary hygiene   4/6 -- decompensated this AM.  Obtained CXR and labs.  Overall consistent with recurrent decompensation of CHF with pulmonary edema  Septic shock - resolved off pressors Multifocal pneumonia,  tachypnea, leukocytosis and elevated lactic acid.  Given IV fluids per sepsis on admission. COPD with acute exacerbation Hx of Asthma/COPD --Completed IV antibiotics -  Zosyn x 7 days --IV steroids >> Prednisone 40 mg and tapering down --Bronchodilators --Mucolytics and other supportive care per orders   Flash Pulmonary Edema - required intubation on 3/31, aggressively diuresed, extubated 4/1. Acute on Chronic diastolic congestive heart failure  SVT  / V Tach A-Fib with RVR Demand Ischemia Hx of Paroxysmal A-fib --Cardiology following - see ercs --Off amio drip 4/3 --Amiodarone 200 mg PO BID x 10 days, then 200 mg daily --Eliquis for stroke risk reduction --Lipitor --Metoprolol was stopped --Daily weights & strict Io's --IV Lasix 40 mg this AM -- monitor response. Hold PO Lasix. --Monitor renal function, electrolytes  --RHC cath 4/2 - see report    Atrial fibrillation with slow ventricular response (HCC) --Management per Cardiology --Continue Eliquis --Metoprolol was stopped, now resumed --Transitioned amiodarone drip >> PO yesterday, 200 mg BID x 10 days, then 200 mg daily --Telemetry --Maintain K>4, Mg>2    Suicidal ideations - POA Depression and Anxiety Psychiatry consulted 3/28 - recommended inpatient psych admission when medically cleared.  IVC was only recommended in the event pt attempted to leave the hospital, but appears patient is still under IVC. --One-to-one observation.   --Psychiatry to re-assess this afternoon --On Buspar 10 mg BID, Prozac 20 mg daily, Trazodone at bedtime --Remeron 7.5 mg at bedtime was added by psychiatry   Metabolic acidosis - resolved --Monitor BMP   Hypophosphatemia Replaced   Hypomagnesemia Replaced   Hypokalemia Replaced   UTI (urinary tract infection) Urine culture only 20k colonies E. Coli   Prolonged QT interval Avoid QT prolonging drugs Serial ekg's Maintain K>4, Mg>2 Telemetry  Diarrhea Stool studies negative   Other specified hypothyroidism Both TSH and T4 elevated --Continue Synthroid 25 mcg    Pulmonary hypertension  Control blood pressure. O2 per  protocol. Monitor hemodynamics closely.  Failure to thrive Poor PO intake --Palliative care consulted for GOC and code status discussions, appreciate assistance      Subjective: Pt resting in bed with son at bedside this AM on rounds.  Palliative NP had seen patient and son earlier and decision was made to proceed with transition to comfort care measures today.  Pt and son both express agreement during my visit.  Hospice liaison notified and reviewing for inpatient hospice placement.     Physical Exam: Vitals:   05/05/23 0110 05/05/23 0410 05/05/23 0500 05/05/23 0848  BP: 119/60 123/60  (!) 132/48  Pulse:  61  (!) 48  Resp: 20 18    Temp: 98.2 F (36.8 C) 98.2 F (36.8 C)  (!) 97.5 F (36.4 C)  TempSrc: Oral Oral    SpO2: 97% 98%  91%  Weight:   58.2 kg   Height:       General exam: drowsy appearing, no acute distress, frail and chronically ill appearing Respiratory system: lung sounds improved since this morning, no wheezes, using accessory muscles on 3 L.min Ider O2 Cardiovascular system: normal S1/S2, RRR, no pedal edema.   Gastrointestinal system: soft, NT, ND Central nervous system: no gross focal neurologic deficits, normal speech Extremities: moves all, no edema, normal tone Skin: dry, intact, normal temperature Psychiatry: depressed mood, congruent affect  Data Reviewed:  Notable labs --   BMP normal WBC 14.4 Hbg 98 stable    Family Communication: Updated son [name] at bedside this AM   Disposition: Status is: Inpatient  Remains inpatient appropriate because: on comfort measures, end of life care, will remain admitted pending acceptance at inpatient hospice facility.    Planned Discharge Destination: Skilled nursing facility    Time spent: 35 minutes  Author: Pennie Banter, DO 05/05/2023 1:47 PM  For on call review [email or web address].

## 2023-05-05 NOTE — Consult Note (Signed)
 [facility] Consult Follow up  Patient Name: .Beth [name]  MRN: [id number]  DOB: 06-May-1945  Consult Order details:  Orders (From admission, onward)     Start     Ordered   04/25/23 0102  IP CONSULT TO PSYCHIATRY       Ordering Provider: Pilar Jarvis, MD  Provider:  (Not yet assigned)  Question Answer Comment  Place call to: ED   Reason for Consult Admit      04/25/23 0101             Mode of Visit: In person    Psychiatry Consult Evaluation  Service Date: May 05, 2023 LOS:  LOS: 10 days  Chief Complaint "I didn't want to live anymore"  Primary Psychiatric Diagnoses  MDD 2.  GAD   Assessment  Beth [name] is a 78 y.o. female admitted: Medicallyfor 04/25/2023 12:02 AM had initially presented to the ED with chief complaints of suicide attempt by attempting to stab herself with scissors, reports of diarrhea starting a few days prior to presentation, and found to be hypokalemic, low magnesium, possible UTI, prolonged QTc. She carries the psychiatric diagnoses of depression, anxiety and has a past medical history of CVA with left-sided residual weakness, neuropathy, insomnia, atrial fibrillation on Eliquis, COPD on home O2 at 3 L, HTN, CHF . Psychiatry was consulted to evaluate for safety and treat.  Patient consistently denies SI/HI/intent/plan.  She continues to deny SI and wanting to feel better. Discussed with her son and daughter in law bedside and they informed psychiatry that they all agreed on comfort care for the patient. Pt is responding well to remeron and there are no acute psychiatric needs at this time identified. Psychiatry is signing off at this time. Please reach back to [place] with any other questions/concerns.  Diagnoses:  Active Hospital problems: Principal Problem:   Acute on chronic respiratory failure with hypoxia and hypercapnia (HCC) Active Problems:   Pulmonary hypertension (HCC)   Chronic diastolic congestive heart failure (HCC)    Depression with suicidal ideation   Asthma with COPD (HCC)   Hypokalemia   Prolonged QT interval   UTI (urinary tract infection)   Atrial fibrillation with slow ventricular response (HCC)   Other specified hypothyroidism   Hypomagnesemia   Suicidal ideation   Diarrhea   Hypophosphatemia   Septic shock (HCC)   Metabolic acidosis   Multifocal pneumonia   Precordial chest pain   Respiratory distress   Protein-calorie malnutrition, severe   Decompensated heart failure (HCC)   MDD (major depressive disorder), recurrent episode (HCC)   Suicidal ideations   Hospice care patient    Plan   ## Psychiatric Medication Recommendations:   Remeron 7.5 mg HS for depression/sleep/appetite   ## Medical Decision Making Capacity: Not specifically addressed in this encounter  ## Further Work-up:  --  EKG  EKG on 04/27/23 Qtc 368 -- Pertinent labwork reviewed earlier this admission includes: CBC, electrolytes repleted WNL    ## Disposition:--  No psychiatric contraindication for discharge after medical clearance  ## Behavioral / Environmental: - No specific recommendations at this time.     ## Safety and Observation Level:  - Based on my clinical evaluation, I estimate the patient to be at low  risk of self harm in the current setting. - At this time, we recommend routine observation this decision is based on my review of the chart including patient's history and current presentation, interview of the patient, mental status examination, and consideration  of suicide risk including evaluating suicidal ideation, plan, intent, suicidal or self-harm behaviors, risk factors, and protective factors. This judgment is based on our ability to directly address suicide risk, implement suicide prevention strategies, and develop a safety plan while the patient is in the clinical setting. Please contact our team if there is a concern that risk level has changed.  CSSR Risk Category:C-SSRS RISK CATEGORY:  Low  Suicide Risk Assessment: Patient has following modifiable risk factors for suicide: Medical problems by getting the treatment and support in the community Patient has following non-modifiable or demographic risk factors for suicide: history of suicide attempt and psychiatric hospitalization Patient has the following protective factors against suicide: Access to outpatient mental health care and Supportive family  Thank you for this consult request. Recommendations have been communicated to the primary team.  We will sign off  at this time.   Verner Chol, MD       History of Present Illness  Pt brought in after self harm gesture of attempting to stab herself with scissors, reports of diarrhea starting a few days prior to presentation, and found to be hypokalemic, low magnesium, possible UTI, prolonged Qtc.  Patient was started on ceftriaxone for possible UTI and urine culture ordered, oral and IV repletion of potassium, oral repletion of magnesium, was seen in the emergency department by psychiatry and started on Prozac and Zyprexa.  Pt hospital stay was complicated with Hypoxic respiratory failure requiring intubation and eventual extubation.pt acutely decompensated with flash pulmonary edema, in addition to SVT/VT followed by A-fib with RVR.  Pt had to emergently intubated to transferred to ICU.In ICU, Cardiology was consulted.  Patient was aggressively diuresed and placed on IV amiodarone.Pt tested + for non-Covid coronoavirus OC43. On 4/2 -- [facility] resumed care, pt extubated on 4/1.Pt had RHC cath done, and hospital course further prolonged and complicated by recurrent pulmonary edema and respiratory decompensation in the setting of severe valvular heart disease, ongoing poor PO intake and failure to thrive.  Palliative care has been involved with goals of care discussions.On 4/7 patient's family decision made to transition to comfort care  Today on interview patient is noted to be resting in  bed.  Her son and daughter-in-law were at bedside.  Provider discussed privately with the family who informed about patient's deteriorating situation and the discussion with the primary team about comfort care.  Patient reports feeling tired and scared.  Patient denies suicidal/homicidal ideation/intent/plan.  Patient did acknowledge that she feels better about being comfortable with the ongoing medical care.    Psychiatric and Social History  Psychiatric History:  Information collected from pt  Prev Dx/Sx: depression, anxiety  Current Psych Provider: provider via nursing facility Home Meds (current): per chart review: Buspar 10 mg BID, Prozac 10 mg daily, lamotrigine 12.5 mg daily, Trazodone 50 mg HS Previous Med Trials: pt reports Klonopin, remeron Therapy: denies current   Prior Psych Hospitalization: reports multiple  Prior Self Harm: pt denies  Prior Violence: pt denies   Family Psych History: parents - depression, son - depression (per chart review, bipolar)/ reports that her  Family Hx suicide: pt reports maternal and paternal cousin   Social History:  Developmental Hx: normal Educational Hx: some college 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  Living Situation: nursing home, [facility] x6 months  Spiritual Hx: Christian  Access to weapons/lethal means: denies    Substance History Alcohol: denies  Type of alcohol  denies Last Drink denies Number of drinks per day denies  History of alcohol withdrawal seizures denies History of DT's denies Tobacco: yes Illicit drugs: denies Prescription drug abuse: denies Rehab hx: denies  Exam Findings   Vital Signs:  Temp:  [97.5 F (36.4 C)-98.2 F (36.8 C)] 98.2 F (36.8 C) (04/07 2014) Pulse Rate:  [47-61] 47 (04/07 2014) Resp:  [17-20] 17 (04/07 2014) BP: (113-132)/(48-60) 113/51 (04/07 2014) SpO2:  [91 %-98 %] 94 % (04/07 2014) Weight:  [58.2 kg] 58.2 kg (04/07 0500) Blood pressure (!) 113/51, pulse (!)  47, temperature 98.2 F (36.8 C), resp. rate 17, height 5\' 5"  (1.651 m), weight 58.2 kg, SpO2 94%. Body mass index is 21.35 kg/m.   Mental Status Exam: General Appearance: Disheveled  Orientation:  Full (Time, Place, and Person)  Memory:  Immediate;   Fair Recent;   Fair Remote;   Fair  Concentration:  Concentration: Good and Attention Span: Good  Recall:  Fair  Attention  Good  Eye Contact:  Good  Speech:  Normal Rate  Language:  Fair  Volume:  Normal  Mood: "tired"  Affect:  Congruent  Thought Process:  Coherent and Linear  Thought Content:  WDL  Suicidal Thoughts:  No  Homicidal Thoughts:  No  Judgement:  Fair  Insight:  Fair  Psychomotor Activity:  Normal  Akathisia:  NA  Fund of Knowledge:  Fair      Assets:  Architect Housing  Cognition:  WNL  ADL's:  Impaired  AIMS (if indicated):        Other History   These have been pulled in through the EMR, reviewed, and updated if appropriate.  Family History:  The patient's family history includes Alcohol abuse in her father; Anxiety disorder in her mother; Bipolar disorder in her son; Breast cancer (age of onset: 55) in her mother; Cancer in her father; Depression in her father and mother; Gallbladder disease in her father.  Medical History: Past Medical History:  Diagnosis Date   Allergy    Anxiety    Asthma    Chronic pain syndrome    discharged from pain clinic, hx of narcotics seeking behavior   COPD (chronic obstructive pulmonary disease) (HCC)    CVA (cerebral infarction)    Depression    Fibromyalgia    Headache    Hyperlipidemia    Hypertension    IBS (irritable bowel syndrome)    Stroke (HCC)    Vitamin D deficiency     Surgical History: Past Surgical History:  Procedure Laterality Date   ABDOMINAL HYSTERECTOMY     APPENDECTOMY     BREAST EXCISIONAL BIOPSY  2011   Pt states lump removed, ? side, no scar seen   BREAST SURGERY  2011   biopsy    CERVICAL DISCECTOMY     CHOLECYSTECTOMY     RIGHT HEART CATH N/A 04/30/2023   Procedure: RIGHT HEART CATH;  Surgeon: Marcina Millard, MD;  Location: [facility] INVASIVE CV LAB;  Service: Cardiovascular;  Laterality: N/A;   SINUSOTOMY       Medications:   Current Facility-Administered Medications:    acetaminophen (TYLENOL) tablet 650 mg, 650 mg, Oral, Q6H PRN **OR** acetaminophen (TYLENOL) suppository 650 mg, 650 mg, Rectal, Q6H PRN, Shon Hough, NP   antiseptic oral rinse (BIOTENE) solution 15 mL, 15 mL, Topical, BID, Clapp, Jeannette Corpus, NP   diphenhydrAMINE (BENADRYL) injection 25 mg, 25 mg, Intravenous, Q4H PRN, Alex Gardener T, NP   docusate sodium (COLACE) capsule 100 mg, 100 mg, Oral, BID, Pennie Banter,  DO, 100 mg at 05/05/23 0902   feeding supplement (ENSURE ENLIVE / ENSURE PLUS) liquid 237 mL, 237 mL, Oral, TID BM, Denton Lank, Kelly A, DO, 237 mL at 05/05/23 1951   fluticasone furoate-vilanterol (BREO ELLIPTA) 200-25 MCG/ACT 1 puff, 1 puff, Inhalation, Daily, 1 puff at 05/05/23 0904 **AND** umeclidinium bromide (INCRUSE ELLIPTA) 62.5 MCG/ACT 1 puff, 1 puff, Inhalation, Daily, Lindajo Royal V, MD, 1 puff at 05/05/23 0905   glycopyrrolate (ROBINUL) tablet 1 mg, 1 mg, Oral, Q4H PRN **OR** glycopyrrolate (ROBINUL) injection 0.2 mg, 0.2 mg, Subcutaneous, Q4H PRN **OR** glycopyrrolate (ROBINUL) injection 0.2 mg, 0.2 mg, Intravenous, Q4H PRN, Alex Gardener T, NP   haloperidol (HALDOL) tablet 2 mg, 2 mg, Oral, Q6H PRN **OR** haloperidol (HALDOL) 2 MG/ML solution 2 mg, 2 mg, Sublingual, Q6H PRN **OR** haloperidol lactate (HALDOL) injection 2 mg, 2 mg, Intravenous, Q6H PRN, Alex Gardener T, NP   ipratropium-albuterol (DUONEB) 0.5-2.5 (3) MG/3ML nebulizer solution 3 mL, 3 mL, Nebulization, Q4H PRN, Manuela Schwartz, NP, 3 mL at 05/04/23 1009   LORazepam (ATIVAN) injection 1 mg, 1 mg, Intravenous, Q1H PRN, Alex Gardener T, NP, 1 mg at 05/05/23 1948   morphine (PF) 2 MG/ML injection 1-4 mg, 1-4 mg,  Intravenous, Q15 min PRN, Alex Gardener T, NP, 4 mg at 05/05/23 1800   ondansetron (ZOFRAN-ODT) disintegrating tablet 4 mg, 4 mg, Oral, Q6H PRN **OR** ondansetron (ZOFRAN) injection 4 mg, 4 mg, Intravenous, Q6H PRN, Shon Hough, NP   Oral care mouth rinse, 15 mL, Mouth Rinse, PRN, Dgayli, Khabib, MD   polyethylene glycol (MIRALAX / GLYCOLAX) packet 17 g, 17 g, Oral, Daily, Denton Lank, Kelly A, DO   polyvinyl alcohol (LIQUIFILM TEARS) 1.4 % ophthalmic solution 1 drop, 1 drop, Both Eyes, QID PRN, Shon Hough, NP  Allergies: Allergies  Allergen Reactions   Bextra  [Valdecoxib]    Compazine [Prochlorperazine Edisylate]     Stroke-like symptoms   Lithium Carbonate     Leg weakness   Lyrica [Pregabalin]     Verner Chol, MD

## 2023-05-05 NOTE — Progress Notes (Signed)
 [facility] CLINIC CARDIOLOGY PROGRESS NOTE       Patient ID: Beth [name] MRN: [id number] DOB/AGE: [birth year] 78 y.o.  Admit date: 04/25/2023 Referring Physician Dr. Renae Gloss Primary Physician Althea Charon, Netta Neat, DO  Primary Cardiologist Dr. Melton Alar Reason for Consultation tachycardia, elevated troponin  HPI: Beth [name] is a 78 y.o. female  with a past medical history of paroxysmal atrial fibrillation on eliquis, hypertension, hyperlipidemia, COPD on home 3L, depression who presented to the ED on 04/25/2023 for suicidal ideation. Has been treated for pneumonia and acute on chronic respiratory failure, decompensated earlier this AM requiring intubation. Was tachycardia and troponins found to be elevated this AM as well. Cardiology was consulted for further evaluation.   Interval history: -Patient seen and examined this morning, resting comfortably. -Respiratory status remains stable on baseline O2.  Adventitious lung sounds noted on exam.  She denies any shortness of breath. -In sinus rhythm on telemetry this morning with borderline slow rates, she is without any lightheadedness or dizziness. -BP remains stable.  Review of systems complete and found to be negative unless listed above    Past Medical History:  Diagnosis Date   Allergy    Anxiety    Asthma    Chronic pain syndrome    discharged from pain clinic, hx of narcotics seeking behavior   COPD (chronic obstructive pulmonary disease) (HCC)    CVA (cerebral infarction)    Depression    Fibromyalgia    Headache    Hyperlipidemia    Hypertension    IBS (irritable bowel syndrome)    Stroke (HCC)    Vitamin D deficiency     Past Surgical History:  Procedure Laterality Date   ABDOMINAL HYSTERECTOMY     APPENDECTOMY     BREAST EXCISIONAL BIOPSY  2011   Pt states lump removed, ? side, no scar seen   BREAST SURGERY  2011   biopsy   CERVICAL DISCECTOMY     CHOLECYSTECTOMY     RIGHT HEART CATH N/A 04/30/2023    Procedure: RIGHT HEART CATH;  Surgeon: Marcina Millard, MD;  Location: [facility] INVASIVE CV LAB;  Service: Cardiovascular;  Laterality: N/A;   SINUSOTOMY      Medications Prior to Admission  Medication Sig Dispense Refill Last Dose/Taking   acetaminophen (TYLENOL) 500 MG tablet Take 500 mg by mouth 3 (three) times daily as needed for mild pain (pain score 1-3).   Taking As Needed   amiodarone (PACERONE) 200 MG tablet Take 1 tablet (200 mg total) by mouth 2 (two) times daily for 12 days, THEN 1 tablet (200 mg total) daily.   04/24/2023 at  8:00 AM   apixaban (ELIQUIS) 5 MG TABS tablet Take 1 tablet (5 mg total) by mouth 2 (two) times daily. 60 tablet 1 04/24/2023 at  5:00 PM   atorvastatin (LIPITOR) 40 MG tablet TAKE 1 TABLET BY MOUTH EVERY DAY (Patient taking differently: Take 40 mg by mouth at bedtime. TAKE 1 TABLET BY MOUTH EVERY DAY) 90 tablet 3 04/24/2023 at  6:00 PM   busPIRone (BUSPAR) 10 MG tablet Take 10 mg by mouth 2 (two) times daily.   04/24/2023 at  4:00 PM   camphor-menthol (SARNA) lotion Apply 1 Application topically 2 (two) times daily.   04/24/2023 at  3:00 PM   FLUoxetine (PROZAC) 10 MG tablet Take 15 mg by mouth daily.   04/24/2023 at  8:00 AM   furosemide (LASIX) 40 MG tablet Take 1 tablet (40 mg total) by mouth daily. If  swelling is less, can take only 1 pill.   04/24/2023 at  8:00 AM   gabapentin (NEURONTIN) 100 MG capsule Take 1 capsule (100 mg total) by mouth 3 (three) times daily.   04/24/2023 at  8:00 PM   guaiFENesin (MUCINEX) 600 MG 12 hr tablet Take 600 mg by mouth every 12 (twelve) hours.   04/24/2023 at  8:00 PM   lamoTRIgine (LAMICTAL) 25 MG tablet Take 12.5 mg by mouth daily.   04/24/2023 at  8:00 AM   levothyroxine (SYNTHROID) 25 MCG tablet Take 1 tablet (25 mcg total) by mouth daily at 6 (six) AM. (Patient taking differently: Take 12.5 mcg by mouth daily at 6 (six) AM.)   04/24/2023 at  6:00 AM   LORazepam (ATIVAN) 0.5 MG tablet Take 0.5 mg by mouth every 8 (eight) hours as  needed for anxiety, sleep, seizure or sedation.   Taking As Needed   melatonin 5 MG TABS Take 1 tablet (5 mg total) by mouth at bedtime.   04/24/2023 at  8:00 PM   Menthol-Zinc Oxide (CALMOSEPTINE) 0.44-20.6 % OINT Apply 1 application  topically as directed. Apply to area at every shift change   04/24/2023 Evening   metoprolol tartrate (LOPRESSOR) 25 MG tablet Take 0.5 tablets (12.5 mg total) by mouth 2 (two) times daily.   04/24/2023 at  8:00 PM   mirtazapine (REMERON) 15 MG tablet Take 15 mg by mouth at bedtime.   04/24/2023 at  8:00 PM   montelukast (SINGULAIR) 10 MG tablet TAKE 1 TABLET(10 MG) BY MOUTH AT BEDTIME 90 tablet 1 04/24/2023 at  8:00 PM   NYAMYC powder Apply 1 Application topically 2 (two) times daily.   04/24/2023 at  4:00 PM   polyethylene glycol (MIRALAX / GLYCOLAX) 17 g packet Take 17 g by mouth daily as needed for mild constipation.   Taking As Needed   traMADol (ULTRAM) 50 MG tablet Take 50 mg by mouth 3 (three) times daily as needed for moderate pain (pain score 4-6).   Taking As Needed   traZODone (DESYREL) 50 MG tablet Take 1 tablet (50 mg total) by mouth at bedtime.   04/24/2023 at  8:00 PM   TRELEGY ELLIPTA 200-62.5-25 MCG/ACT AEPB INHALE 1 PUFF INTO THE LUNGS DAILY 60 each 2 04/24/2023 at  8:00 AM   Social History   Socioeconomic History   Marital status: Legally Separated    Spouse name: Not on file   Number of children: 2   Years of education: Not on file   Highest education level: Not on file  Occupational History   Occupation: retired  Tobacco Use   Smoking status: Some Days    Current packs/day: 0.00    Average packs/day: 0.3 packs/day for 57.0 years (14.3 ttl pk-yrs)    Types: Cigarettes    Start date: 10/25/1963    Last attempt to quit: 10/24/2020    Years since quitting: 2.5   Smokeless tobacco: Never   Tobacco comments:    16 cigarettes a week. Khj 07/26/2022  Vaping Use   Vaping status: Never Used  Substance and Sexual Activity   Alcohol use: No     Alcohol/week: 0.0 standard drinks of alcohol   Drug use: No   Sexual activity: Not Currently  Other Topics Concern   Not on file  Social History Narrative   Not on file   Social Drivers of Health   Financial Resource Strain: Low Risk  (02/01/2022)   Overall Financial Resource Strain (CARDIA)  Difficulty of Paying Living Expenses: Not hard at all  Food Insecurity: No Food Insecurity (04/25/2023)   Hunger Vital Sign    Worried About Running Out of Food in the Last Year: Never true    Ran Out of Food in the Last Year: Never true  Transportation Needs: Unmet Transportation Needs (04/25/2023)   PRAPARE - Transportation    Lack of Transportation (Medical): Yes    Lack of Transportation (Non-Medical): Yes  Physical Activity: Insufficiently Active (02/01/2022)   Exercise Vital Sign    Days of Exercise per Week: 7 days    Minutes of Exercise per Session: 20 min  Stress: No Stress Concern Present (02/01/2022)   [employer] of Occupational Health - Occupational Stress Questionnaire    Feeling of Stress : Only a little  Social Connections: Socially Isolated (04/25/2023)   Social Connection and Isolation Panel [NHANES]    Frequency of Communication with Friends and Family: Twice a week    Frequency of Social Gatherings with Friends and Family: Once a week    Attends Religious Services: Never    Database administrator or Organizations: No    Attends Banker Meetings: Never    Marital Status: Separated  Intimate Partner Violence: Not At Risk (04/25/2023)   Humiliation, Afraid, Rape, and Kick questionnaire    Fear of Current or Ex-Partner: No    Emotionally Abused: No    Physically Abused: No    Sexually Abused: No    Family History  Problem Relation Age of Onset   Anxiety disorder Mother    Depression Mother    Breast cancer Mother 64   Cancer Father    Gallbladder disease Father    Alcohol abuse Father    Depression Father    Bipolar disorder Son      Vitals:    05/05/23 0110 05/05/23 0410 05/05/23 0500 05/05/23 0848  BP: 119/60 123/60  (!) 132/48  Pulse:  61  (!) 48  Resp: 20 18    Temp: 98.2 F (36.8 C) 98.2 F (36.8 C)  (!) 97.5 F (36.4 C)  TempSrc: Oral Oral    SpO2: 97% 98%  91%  Weight:   58.2 kg   Height:        PHYSICAL EXAM General: Chronically ill appearing elderly female, intubated and sedated. HEENT: Normocephalic and atraumatic. Neck: No JVD.  Lungs: Normal respiratory effort on 3L [place].  Heart: Irregularly irregular, fast rate. Normal S1 and S2 without gallops or murmurs.  Abdomen: Non-distended appearing.  Msk: Normal strength and tone for age. Extremities: Warm and well perfused. No clubbing, cyanosis. No edema.  Neuro: Alert and oriented X 3. Psych: Answers questions appropriately.   Labs: Basic Metabolic Panel: Recent Labs    05/03/23 0610 05/04/23 0404 05/05/23 0713  NA 142 142 143  K 3.4* 4.8 4.3  CL 105 108 104  CO2 30 28 30   GLUCOSE 91 101* 99  BUN 21 18 21   CREATININE 0.89 0.72 0.82  CALCIUM 8.5* 9.0 9.1  MG 2.2  --  2.3  PHOS 3.2 3.0 3.3   Liver Function Tests: Recent Labs    05/04/23 0404 05/05/23 0713  ALBUMIN 2.4* 2.5*   No results for input(s): "LIPASE", "AMYLASE" in the last 72 hours. CBC: Recent Labs    05/04/23 0404 05/05/23 0713  WBC 12.4* 14.4*  HGB 9.7* 9.8*  HCT 30.4* 29.9*  MCV 93.0 91.7  PLT 218 242   Cardiac Enzymes: Recent Labs  05/04/23 1052 05/04/23 1229  TROPONINIHS 29* 33*    BNP: Recent Labs    05/03/23 0610 05/04/23 1052  BNP 992.2* 1,414.5*    D-Dimer: No results for input(s): "DDIMER" in the last 72 hours. Hemoglobin A1C: No results for input(s): "HGBA1C" in the last 72 hours.  Fasting Lipid Panel: No results for input(s): "CHOL", "HDL", "LDLCALC", "TRIG", "CHOLHDL", "LDLDIRECT" in the last 72 hours.  Thyroid Function Tests: No results for input(s): "TSH", "T4TOTAL", "T3FREE", "THYROIDAB" in the last 72 hours.  Invalid input(s):  "FREET3" Anemia Panel: No results for input(s): "VITAMINB12", "FOLATE", "FERRITIN", "TIBC", "IRON", "RETICCTPCT" in the last 72 hours.   Radiology: [facility] Chest Port 1 View Result Date: 05/04/2023 CLINICAL DATA:  78 year old female with shortness of breath. EXAM: PORTABLE CHEST 1 VIEW COMPARISON:  Portable chest yesterday and earlier. FINDINGS: Portable AP view at 1012 hours. Mildly lower lung volumes. Stable cardiac size and mediastinal contours. Coarse bilateral interstitial opacity persists, and there is increasing bilateral lung base opacity compared to yesterday, partially veiling. No air bronchograms identified. Stable visualized osseous structures. Cervical ACDF. Stable visible bowel gas. IMPRESSION: Continued coarse bilateral pulmonary interstitial opacity and increased confluent/veiling bilateral lung base opacity since yesterday. Consider increasing pleural effusions, atelectasis, infection. Electronically Signed   By: Odessa Fleming M.D.   On: 05/04/2023 12:46   DG Chest Port 1 View Result Date: 05/03/2023 CLINICAL DATA:  78 year old female with shortness of breath. EXAM: PORTABLE CHEST 1 VIEW COMPARISON:  Portable chest 04/29/2023 and earlier. FINDINGS: Portable AP upright view at 0925 hours. Extubated and enteric tube removed. Stable lung volumes. Mediastinal contours are within normal limits. Coarse bilateral, fairly symmetric pulmonary interstitial opacity now. Confluent and veiling left lung base opacity, not significantly changed from 04/28/2023. Right lung ventilation mildly improved since that time. No pneumothorax. No convincing pulmonary edema. No acute osseous abnormality identified. Negative visible bowel gas. Cervical ACDF partially visible. IMPRESSION: 1. Extubated and enteric tube removed. 2. Stable lung volumes. Ongoing coarse bilateral pulmonary interstitial opacity and lung base hypo ventilation with suspicion of small pleural effusion(s). But ventilation improved since 04/28/2023.  Electronically Signed   By: Odessa Fleming M.D.   On: 05/03/2023 12:33   CARDIAC CATHETERIZATION Result Date: 04/30/2023   Hemodynamic findings consistent with moderate pulmonary hypertension. 1.  Moderate pulmonary hypertension, PA mean 42 mmHg 2.  PCWP 28 mmHg 3.  Fick cardiac output 4.57 L/min   DG Chest Port 1 View Result Date: 04/29/2023 CLINICAL DATA:  78 year old female with respiratory failure. Intubated. EXAM: PORTABLE CHEST 1 VIEW COMPARISON:  Portable chest yesterday and earlier. FINDINGS: Portable AP view at 0511 hours. Mildly more rotated to the right. Endotracheal tube tip in good position between the clavicles and carina. Visible enteric tube loops in the stomach. Mediastinal contours remain within normal limits. Lung volumes slightly larger. Coarse bilateral pulmonary interstitial opacity with vague, confluent lung base opacity continues. But mildly improved bilateral ventilation since yesterday. No pneumothorax. No areas of worsening ventilation. Stable visualized osseous structures. Paucity of bowel gas. IMPRESSION: 1. Satisfactory endotracheal and enteric tubes. 2. Mildly improved bilateral lung volumes and ventilation since yesterday. Ongoing widespread coarse interstitial and more confluent bilateral lung base opacity. Electronically Signed   By: Odessa Fleming M.D.   On: 04/29/2023 07:59   ECHOCARDIOGRAM COMPLETE Result Date: 04/28/2023    ECHOCARDIOGRAM REPORT   Patient Name:   Beth [name] Date of Exam: 04/28/2023 Medical Rec #:  [id number]        Height:  65.0 in Accession #:    [id number]       Weight:       120.0 lb Date of Birth:  Jun 01, 1945        BSA:          1.592 m Patient Age:    77 years         BP:           132/64 mmHg Patient Gender: F                HR:           65 bpm. Exam Location:  [facility] Procedure: 2D Echo, Cardiac Doppler and Color Doppler (Both Spectral and Color            Flow Doppler were utilized during procedure). Indications:     Elevated Troponin  History:          Patient has prior history of Echocardiogram examinations, most                  recent 09/09/2022. COPD and Stroke, Arrythmias:Atrial                  Fibrillation, Signs/Symptoms:Dyspnea; Risk                  Factors:Hypertension, Dyslipidemia and Current Smoker. CKD.  Sonographer:     Mikki Harbor Referring Phys:  [id number] Andris Baumann Diagnosing Phys: Yvonne Kendall MD  Sonographer Comments: Echo performed with patient supine and on artificial respirator. Image acquisition challenging due to respiratory motion. IMPRESSIONS  1. Left ventricular ejection fraction, by estimation, is 55 to 60%. The left ventricle has normal function. The left ventricle has no regional wall motion abnormalities. Left ventricular diastolic parameters were normal.  2. Right ventricular systolic function is mildly reduced. The right ventricular size is normal. Mildly increased right ventricular wall thickness. There is severely elevated pulmonary artery systolic pressure.  3. Left atrial size was moderately dilated.  4. Moderate pleural effusion in the left lateral region.  5. The mitral valve is abnormal. Moderate to severe mitral valve regurgitation.  6. Tricuspid valve regurgitation is moderate to severe.  7. The aortic valve is tricuspid. There is mild calcification of the aortic valve. There is mild thickening of the aortic valve. Aortic valve regurgitation is mild. Aortic valve sclerosis/calcification is present, without any evidence of aortic stenosis.  8. The inferior vena cava is dilated in size with <50% respiratory variability, suggesting right atrial pressure of 15 mmHg. FINDINGS  Left Ventricle: Left ventricular ejection fraction, by estimation, is 55 to 60%. The left ventricle has normal function. The left ventricle has no regional wall motion abnormalities. The left ventricular internal cavity size was normal in size. There is  borderline left ventricular hypertrophy. Left ventricular diastolic parameters were  normal. Right Ventricle: The right ventricular size is normal. Mildly increased right ventricular wall thickness. Right ventricular systolic function is mildly reduced. There is severely elevated pulmonary artery systolic pressure. The tricuspid regurgitant velocity is 3.64 m/s, and with an assumed right atrial pressure of 15 mmHg, the estimated right ventricular systolic pressure is 68.0 mmHg. Left Atrium: Left atrial size was moderately dilated. Right Atrium: Right atrial size was normal in size. Pericardium: Trivial pericardial effusion is present. Mitral Valve: The mitral valve is abnormal. Mild to moderate mitral annular calcification. Moderate to severe mitral valve regurgitation. MV peak gradient, 7.1 mmHg. The mean mitral valve gradient is 2.0 mmHg. Tricuspid Valve: The tricuspid  valve is normal in structure. Tricuspid valve regurgitation is moderate to severe. Aortic Valve: The aortic valve is tricuspid. There is mild calcification of the aortic valve. There is mild thickening of the aortic valve. Aortic valve regurgitation is mild. Aortic valve sclerosis/calcification is present, without any evidence of aortic stenosis. Aortic valve mean gradient measures 4.0 mmHg. Aortic valve peak gradient measures 8.0 mmHg. Aortic valve area, by VTI measures 2.04 cm. Pulmonic Valve: The pulmonic valve was not well visualized. Pulmonic valve regurgitation is not visualized. No evidence of pulmonic stenosis. Aorta: The aortic root and ascending aorta are structurally normal, with no evidence of dilitation. Venous: The inferior vena cava is dilated in size with less than 50% respiratory variability, suggesting right atrial pressure of 15 mmHg. IAS/Shunts: No atrial level shunt detected by color flow Doppler. Additional Comments: There is a moderate pleural effusion in the left lateral region.  LEFT VENTRICLE PLAX 2D LVIDd:         4.70 cm     Diastology LVIDs:         3.30 cm     LV e' medial:    8.59 cm/s LV PW:          1.13 cm     LV E/e' medial:  13.5 LV IVS:        0.80 cm     LV e' lateral:   13.30 cm/s LVOT diam:     1.90 cm     LV E/e' lateral: 8.7 LV SV:         67 LV SV Index:   42 LVOT Area:     2.84 cm  LV Volumes (MOD) LV vol d, MOD A2C: 53.5 ml LV vol d, MOD A4C: 57.5 ml LV vol s, MOD A2C: 20.6 ml LV vol s, MOD A4C: 25.1 ml LV SV MOD A2C:     32.9 ml LV SV MOD A4C:     57.5 ml LV SV MOD BP:      31.8 ml RIGHT VENTRICLE RV Basal diam:  3.00 cm RV Mid diam:    2.30 cm LEFT ATRIUM             Index        RIGHT ATRIUM           Index LA diam:        4.20 cm 2.64 cm/m   RA Area:     14.70 cm LA Vol (A2C):   81.4 ml 51.13 ml/m  RA Volume:   35.50 ml  22.30 ml/m LA Vol (A4C):   76.5 ml 48.05 ml/m LA Biplane Vol: 79.6 ml 50.00 ml/m  AORTIC VALVE                    PULMONIC VALVE AV Area (Vmax):    2.21 cm     PV Vmax:       0.72 m/s AV Area (Vmean):   2.11 cm     PV Peak grad:  2.1 mmHg AV Area (VTI):     2.04 cm AV Vmax:           141.00 cm/s AV Vmean:          91.550 cm/s AV VTI:            0.328 m AV Peak Grad:      8.0 mmHg AV Mean Grad:      4.0 mmHg LVOT Vmax:         110.00 cm/s  LVOT Vmean:        68.000 cm/s LVOT VTI:          0.236 m LVOT/AV VTI ratio: 0.72  AORTA Ao Root diam: 3.30 cm Ao Asc diam:  3.20 cm MITRAL VALVE                  TRICUSPID VALVE MV Area (PHT): 5.54 cm       TR Peak grad:   53.0 mmHg MV Area VTI:   1.91 cm       TR Vmax:        364.00 cm/s MV Peak grad:  7.1 mmHg MV Mean grad:  2.0 mmHg       SHUNTS MV Vmax:       1.33 m/s       Systemic VTI:  0.24 m MV Vmean:      56.9 cm/s      Systemic Diam: 1.90 cm MV Decel Time: 137 msec MR Peak grad:    145.9 mmHg MR Mean grad:    91.0 mmHg MR Vmax:         604.00 cm/s MR Vmean:        449.0 cm/s MR PISA:         2.26 cm MR PISA Eff ROA: 35 mm MR PISA Radius:  0.60 cm MV E velocity: 116.00 cm/s MV A velocity: 69.70 cm/s MV E/A ratio:  1.66 Cristal De[place] End MD Electronically signed by Yvonne Kendall MD Signature Date/Time: 04/28/2023/7:14:48 PM     Final    DG Chest Port 1 View Result Date: 04/28/2023 CLINICAL DATA:  Intubation EXAM: PORTABLE CHEST 1 VIEW COMPARISON:  Two days ago FINDINGS: Diffuse interstitial opacity with patchy airspace density greatest at the right base, progressed at the right base. There is small volume pleural fluid likely. No pneumothorax. Endotracheal tube with tip between the clavicular heads and carina. An enteric tube reaches the stomach at least. Normal heart size IMPRESSION: Worsening infiltrate especially at the right lung base. New hardware in unremarkable position. Electronically Signed   By: Tiburcio Pea M.D.   On: 04/28/2023 10:26   DG Abd 1 View Result Date: 04/28/2023 CLINICAL DATA:  NG placement. EXAM: ABDOMEN - 1 VIEW COMPARISON:  None Available. FINDINGS: Enteric tube with tip in the region of the distal stomach. No bowel dilatation noted. Small bilateral pleural effusions and diffuse interstitial and bibasilar densities. IMPRESSION: Enteric tube with tip in the distal stomach. Electronically Signed   By: Elgie Collard M.D.   On: 04/28/2023 10:23   DG Chest Port 1 View Result Date: 04/26/2023 CLINICAL DATA:  Respiratory failure EXAM: PORTABLE CHEST 1 VIEW COMPARISON:  04/26/2023 at 9:13 a.m. FINDINGS: Increased predominantly interstitial opacities bilaterally greatest in the left mid/upper lung. No pleural effusion or pneumothorax. Stable cardiomediastinal silhouette. Aortic atherosclerotic calcification. IMPRESSION: Increased predominantly interstitial opacities bilaterally greatest in the left mid/upper lung. Findings favor pneumonia superimposed on a background emphysema. Electronically Signed   By: Minerva Fester M.D.   On: 04/26/2023 23:49   DG Chest Port 1 View Result Date: 04/26/2023 CLINICAL DATA:  Cough. Recent antibiotics for pneumonia with resulting diarrhea. History of CVA and atrial fibrillation. EXAM: PORTABLE CHEST 1 VIEW COMPARISON:  None recent. Chest CT 09/13/2022. Radiographs  09/13/2022 and 08/25/2022. FINDINGS: 0913 hours. The heart size and mediastinal contours are stable with mild aortic atherosclerosis. The aeration of the lungs has improved compared with the available prior studies. There are patchy pulmonary opacities bilaterally which could reflect  postinflammatory scarring or recurrent infection. There is no consolidation, significant pleural effusion or pneumothorax. The bones appear unchanged status post lower cervical fusion. IMPRESSION: Overall improvement in bilateral airspace opacities compared with prior studies from August. Patchy pulmonary opacities bilaterally which could reflect postinflammatory scarring or recurrent infection. No consolidation or pleural effusion. Electronically Signed   By: Carey Bullocks M.D.   On: 04/26/2023 13:05    ECHO as above  TELEMETRY reviewed by me 05/05/2023: Sinus bradycardia rate 40s  EKG reviewed by me: VT 180 bpm  Data reviewed by me 05/05/2023: last 24h vitals tele labs imaging I/O hospitalist progress notes, PCCM notes  Principal Problem:   Acute on chronic respiratory failure with hypoxia and hypercapnia (HCC) Active Problems:   Pulmonary hypertension (HCC)   Chronic diastolic congestive heart failure (HCC)   Depression with suicidal ideation   Asthma with COPD (HCC)   Hypokalemia   Prolonged QT interval   UTI (urinary tract infection)   Atrial fibrillation with slow ventricular response (HCC)   Other specified hypothyroidism   Hypomagnesemia   Suicidal ideation   Diarrhea   Hypophosphatemia   Septic shock (HCC)   Metabolic acidosis   Multifocal pneumonia   Precordial chest pain   Respiratory distress   Protein-calorie malnutrition, severe   Decompensated heart failure (HCC)   MDD (major depressive disorder), recurrent episode (HCC)   Suicidal ideations    ASSESSMENT AND PLAN:  Beth [name] is a 78 y.o. female  with a past medical history of paroxysmal atrial fibrillation on eliquis,  hypertension, hyperlipidemia, COPD on home 3L, depression who presented to the ED on 04/25/2023 for suicidal ideation. Has been treated for pneumonia and acute on chronic respiratory failure, decompensated earlier this AM requiring intubation. Was tachycardia and troponins found to be elevated this AM as well. Cardiology was consulted for further evaluation.   # Acute on Chronic respiratory failure # Pneumonia # Ventricular Tachycardia # Demand Ischemia   # Paroxysmal Atrial fibrillation  Patient's respiratory status decompensated this morning that required intubation and had VT in 170-180s. Patient received 2x bolus of 150 mg Amio and IV 2g Mg, converted to NSR rate in 70s. Patient is hemodynamically stable. Trops elevated and flat 481 > 449 likely due to demand ischemia. Today labs showed elevated BNP 1612.  Echo with preserved EF, no wall motion abnormalities, mildly reduced RV function, severely elevated PASP, moderate to severe MR and TR, moderate pleural effusion.  RHC yesterday with moderate pulmonary hypertension, PA mean 42, wedge 28. -Continue p.o. amiodarone load.  Continue metoprolol tartrate 25 mg twice daily. -Continue IV Lasix 40 mg twice daily. -Continue Eliquis for stroke risk reduction  -Continue atorvastatin 40 mg per tube -Further management of respiratory failure per hospitalist service.   This patient's plan of care was discussed and created with Dr. Darrold Junker and he is in agreement.  Signed: Gale Journey, PA-C  05/05/2023, 8:57 AM [facility]

## 2023-05-05 NOTE — Progress Notes (Signed)
 Pt is now discharged to hospice care. IV Ativan given prior to discharge. Pt is stable and denies any complaints.

## 2023-05-05 NOTE — Progress Notes (Signed)
 [facility] Lia[facility]   Received request from Transitions of Care Manger for family interest in the Hospice Home.  Eligibility has been confirmed.  Met with patient and family to confirm interest and explain services.  Family agreeable to transfer today after 8pm.  .  Transitions of Care manager aware.  RN please call report to [phone].  [facility]) prior to patient leaving the unit.  Please send signed and completed DNR with patient at discharge.  Thank you  Redge Gainer,  [facility] Liaison [phone]

## 2023-05-05 NOTE — Progress Notes (Signed)
 05/05/23 Patient under droplet and contact precautions,  IMM given to RN assigned to patient to give to patient at bedside.

## 2023-05-05 NOTE — Progress Notes (Signed)
 PHARMACY CONSULT NOTE - FOLLOW UP  Pharmacy Consult for Electrolyte Monitoring and Replacement   Recent Labs: Potassium (mmol/L)  Date Value  05/05/2023 4.3  12/26/2012 3.3 (L)   Magnesium (mg/dL)  Date Value  16/10/9602 2.3  12/25/2012 1.8   Calcium (mg/dL)  Date Value  54/09/8117 9.1   Calcium, Total (mg/dL)  Date Value  14/78/2956 9.1   Albumin (g/dL)  Date Value  21/30/8657 2.5 (L)  03/23/2015 3.6  12/20/2012 3.5   Phosphorus (mg/dL)  Date Value  84/69/6295 3.3   Sodium (mmol/L)  Date Value  05/05/2023 143  01/01/2021 143  12/26/2012 144     Assessment: 78 yo F with PMH including CVA, Afib (on Eliquis) presenting with hypokalemia. Per patient, recently experienced diarrhea for a few days due to taking antibiotic for pneumonia. Pharmacy has been consulted to manage electrolytes. On amio.   Diuretics: furosemide 40 mg po once daily > lasix 40 mg IV BID.   Goal of Therapy:  Potassium 4.0 - 5.1 mmol/L Magnesium 2.0 - 2.4 mg/dL All Other Electrolytes WNL  Plan: No replacement needed. F/u with AM labs.    Ronnald Ramp, PharmD Clinical Pharmacist 05/05/2023 8:00 AM

## 2023-05-29 DEATH — deceased
